# Patient Record
Sex: Female | Born: 1980
Health system: Southern US, Community
[De-identification: ages and names within clinical notes are randomized; demographics above are authoritative.]

## PROBLEM LIST (undated history)

## (undated) ENCOUNTER — Inpatient Hospital Stay (HOSPITAL_COMMUNITY): Payer: Self-pay

## (undated) DIAGNOSIS — A549 Gonococcal infection, unspecified: Secondary | ICD-10-CM

## (undated) DIAGNOSIS — F329 Major depressive disorder, single episode, unspecified: Secondary | ICD-10-CM

## (undated) DIAGNOSIS — F319 Bipolar disorder, unspecified: Secondary | ICD-10-CM

## (undated) DIAGNOSIS — F419 Anxiety disorder, unspecified: Secondary | ICD-10-CM

## (undated) DIAGNOSIS — K7460 Unspecified cirrhosis of liver: Secondary | ICD-10-CM

## (undated) DIAGNOSIS — N289 Disorder of kidney and ureter, unspecified: Secondary | ICD-10-CM

## (undated) DIAGNOSIS — I129 Hypertensive chronic kidney disease with stage 1 through stage 4 chronic kidney disease, or unspecified chronic kidney disease: Secondary | ICD-10-CM

## (undated) DIAGNOSIS — N739 Female pelvic inflammatory disease, unspecified: Secondary | ICD-10-CM

## (undated) DIAGNOSIS — R0600 Dyspnea, unspecified: Secondary | ICD-10-CM

## (undated) DIAGNOSIS — IMO0002 Reserved for concepts with insufficient information to code with codable children: Secondary | ICD-10-CM

## (undated) DIAGNOSIS — F32A Depression, unspecified: Secondary | ICD-10-CM

## (undated) DIAGNOSIS — K219 Gastro-esophageal reflux disease without esophagitis: Secondary | ICD-10-CM

## (undated) DIAGNOSIS — A599 Trichomoniasis, unspecified: Secondary | ICD-10-CM

## (undated) DIAGNOSIS — M329 Systemic lupus erythematosus, unspecified: Secondary | ICD-10-CM

## (undated) HISTORY — DX: Unspecified cirrhosis of liver: K74.60

## (undated) HISTORY — PX: RENAL BIOPSY: SHX156

---
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
## 2002-07-28 ENCOUNTER — Emergency Department (HOSPITAL_COMMUNITY): Admission: EM | Admit: 2002-07-28 | Discharge: 2002-07-28 | Payer: Self-pay | Admitting: Internal Medicine

## 2002-12-22 ENCOUNTER — Ambulatory Visit (HOSPITAL_COMMUNITY): Admission: RE | Admit: 2002-12-22 | Discharge: 2002-12-22 | Payer: Self-pay | Admitting: Obstetrics and Gynecology

## 2002-12-30 ENCOUNTER — Ambulatory Visit (HOSPITAL_COMMUNITY): Admission: AD | Admit: 2002-12-30 | Discharge: 2002-12-30 | Payer: Self-pay | Admitting: Internal Medicine

## 2003-01-02 ENCOUNTER — Inpatient Hospital Stay (HOSPITAL_COMMUNITY): Admission: RE | Admit: 2003-01-02 | Discharge: 2003-01-04 | Payer: Self-pay | Admitting: Obstetrics and Gynecology

## 2004-06-07 ENCOUNTER — Emergency Department (HOSPITAL_COMMUNITY): Admission: EM | Admit: 2004-06-07 | Discharge: 2004-06-07 | Payer: Self-pay | Admitting: Emergency Medicine

## 2005-02-21 ENCOUNTER — Inpatient Hospital Stay (HOSPITAL_COMMUNITY): Admission: EM | Admit: 2005-02-21 | Discharge: 2005-02-23 | Payer: Self-pay | Admitting: Emergency Medicine

## 2005-08-05 ENCOUNTER — Emergency Department (HOSPITAL_COMMUNITY): Admission: EM | Admit: 2005-08-05 | Discharge: 2005-08-05 | Payer: Self-pay | Admitting: Emergency Medicine

## 2005-08-27 ENCOUNTER — Emergency Department (HOSPITAL_COMMUNITY): Admission: EM | Admit: 2005-08-27 | Discharge: 2005-08-27 | Payer: Self-pay | Admitting: Emergency Medicine

## 2005-11-05 ENCOUNTER — Emergency Department (HOSPITAL_COMMUNITY): Admission: EM | Admit: 2005-11-05 | Discharge: 2005-11-05 | Payer: Self-pay | Admitting: Emergency Medicine

## 2005-11-11 ENCOUNTER — Emergency Department (HOSPITAL_COMMUNITY): Admission: EM | Admit: 2005-11-11 | Discharge: 2005-11-11 | Payer: Self-pay | Admitting: Emergency Medicine

## 2005-11-13 ENCOUNTER — Emergency Department (HOSPITAL_COMMUNITY): Admission: EM | Admit: 2005-11-13 | Discharge: 2005-11-13 | Payer: Self-pay | Admitting: Emergency Medicine

## 2006-02-10 ENCOUNTER — Emergency Department (HOSPITAL_COMMUNITY): Admission: EM | Admit: 2006-02-10 | Discharge: 2006-02-10 | Payer: Self-pay | Admitting: Emergency Medicine

## 2006-08-01 ENCOUNTER — Emergency Department (HOSPITAL_COMMUNITY): Admission: EM | Admit: 2006-08-01 | Discharge: 2006-08-01 | Payer: Self-pay | Admitting: Emergency Medicine

## 2006-09-20 ENCOUNTER — Emergency Department (HOSPITAL_COMMUNITY): Admission: EM | Admit: 2006-09-20 | Discharge: 2006-09-20 | Payer: Self-pay | Admitting: Emergency Medicine

## 2006-11-25 ENCOUNTER — Emergency Department (HOSPITAL_COMMUNITY): Admission: EM | Admit: 2006-11-25 | Discharge: 2006-11-25 | Payer: Self-pay | Admitting: Emergency Medicine

## 2007-01-02 ENCOUNTER — Emergency Department (HOSPITAL_COMMUNITY): Admission: EM | Admit: 2007-01-02 | Discharge: 2007-01-02 | Payer: Self-pay | Admitting: Emergency Medicine

## 2007-01-27 ENCOUNTER — Emergency Department (HOSPITAL_COMMUNITY): Admission: EM | Admit: 2007-01-27 | Discharge: 2007-01-27 | Payer: Self-pay | Admitting: Emergency Medicine

## 2007-05-13 ENCOUNTER — Ambulatory Visit (HOSPITAL_COMMUNITY): Admission: RE | Admit: 2007-05-13 | Discharge: 2007-05-13 | Payer: Self-pay | Admitting: Family Medicine

## 2007-05-18 ENCOUNTER — Ambulatory Visit (HOSPITAL_COMMUNITY): Admission: RE | Admit: 2007-05-18 | Discharge: 2007-05-18 | Payer: Self-pay | Admitting: Neonatology

## 2007-05-18 ENCOUNTER — Ambulatory Visit: Payer: Self-pay | Admitting: *Deleted

## 2007-05-18 ENCOUNTER — Inpatient Hospital Stay (HOSPITAL_COMMUNITY): Admission: AD | Admit: 2007-05-18 | Discharge: 2007-05-18 | Payer: Self-pay | Admitting: Obstetrics & Gynecology

## 2007-07-08 ENCOUNTER — Emergency Department (HOSPITAL_COMMUNITY): Admission: EM | Admit: 2007-07-08 | Discharge: 2007-07-08 | Payer: Self-pay | Admitting: *Deleted

## 2007-08-04 ENCOUNTER — Inpatient Hospital Stay (HOSPITAL_COMMUNITY): Admission: AD | Admit: 2007-08-04 | Discharge: 2007-08-06 | Payer: Self-pay | Admitting: Obstetrics

## 2008-01-15 ENCOUNTER — Emergency Department (HOSPITAL_COMMUNITY): Admission: EM | Admit: 2008-01-15 | Discharge: 2008-01-15 | Payer: Self-pay | Admitting: Emergency Medicine

## 2008-01-28 ENCOUNTER — Inpatient Hospital Stay (HOSPITAL_COMMUNITY): Admission: AD | Admit: 2008-01-28 | Discharge: 2008-01-29 | Payer: Self-pay | Admitting: Obstetrics & Gynecology

## 2008-02-29 ENCOUNTER — Emergency Department (HOSPITAL_COMMUNITY): Admission: EM | Admit: 2008-02-29 | Discharge: 2008-02-29 | Payer: Self-pay | Admitting: Emergency Medicine

## 2008-07-18 ENCOUNTER — Ambulatory Visit (HOSPITAL_COMMUNITY): Admission: RE | Admit: 2008-07-18 | Discharge: 2008-07-18 | Payer: Self-pay | Admitting: Obstetrics

## 2009-03-09 ENCOUNTER — Inpatient Hospital Stay (HOSPITAL_COMMUNITY): Admission: AD | Admit: 2009-03-09 | Discharge: 2009-03-10 | Payer: Self-pay | Admitting: Obstetrics

## 2009-05-04 ENCOUNTER — Emergency Department (HOSPITAL_COMMUNITY): Admission: EM | Admit: 2009-05-04 | Discharge: 2009-05-05 | Payer: Self-pay | Admitting: Emergency Medicine

## 2010-09-02 ENCOUNTER — Emergency Department (HOSPITAL_COMMUNITY): Admission: EM | Admit: 2010-09-02 | Discharge: 2010-09-02 | Payer: Self-pay | Admitting: Emergency Medicine

## 2010-11-08 ENCOUNTER — Emergency Department (HOSPITAL_COMMUNITY)
Admission: EM | Admit: 2010-11-08 | Discharge: 2010-11-08 | Payer: Self-pay | Source: Home / Self Care | Admitting: Emergency Medicine

## 2010-12-01 NOTE — L&D Delivery Note (Signed)
Delivery Note At 12:47 PM a viable and healthy female was delivered via Vaginal, Spontaneous Delivery (Presentation:ROA).  APGAR: 9, 9; weight 6 lb 12.6 oz (3079 g).   Placenta status: delivered, intact .  Cord: 3 vessels with the following complications: none.  Anesthesia: Epidural  Episiotomy: None Lacerations: none Est. Blood Loss (mL): 350 mL  Mom to postpartum.  Baby to room in with mother.  Uno Esau N 09/24/2011, 1:08 PM

## 2011-02-10 LAB — URINALYSIS, ROUTINE W REFLEX MICROSCOPIC
Nitrite: NEGATIVE
Protein, ur: NEGATIVE mg/dL
Specific Gravity, Urine: 1.018 (ref 1.005–1.030)
Urobilinogen, UA: 0.2 mg/dL (ref 0.0–1.0)

## 2011-02-10 LAB — URINE CULTURE
Colony Count: NO GROWTH
Culture  Setup Time: 201112091522
Culture: NO GROWTH

## 2011-02-10 LAB — POCT PREGNANCY, URINE: Preg Test, Ur: NEGATIVE

## 2011-02-10 LAB — WET PREP, GENITAL

## 2011-02-10 LAB — URINE MICROSCOPIC-ADD ON

## 2011-02-13 ENCOUNTER — Inpatient Hospital Stay (HOSPITAL_COMMUNITY)
Admission: AD | Admit: 2011-02-13 | Discharge: 2011-02-13 | Disposition: A | Discharge status: Home / Self Care | Payer: Medicaid Other | Source: Ambulatory Visit | Attending: Obstetrics | Admitting: Obstetrics

## 2011-02-13 DIAGNOSIS — Z3201 Encounter for pregnancy test, result positive: Secondary | ICD-10-CM | POA: Insufficient documentation

## 2011-02-13 LAB — URINALYSIS, ROUTINE W REFLEX MICROSCOPIC
Ketones, ur: NEGATIVE mg/dL
Nitrite: NEGATIVE
Protein, ur: NEGATIVE mg/dL

## 2011-02-13 LAB — POCT PREGNANCY, URINE: Preg Test, Ur: POSITIVE

## 2011-03-01 ENCOUNTER — Inpatient Hospital Stay (HOSPITAL_COMMUNITY)
Admission: AD | Admit: 2011-03-01 | Discharge: 2011-03-01 | Disposition: A | Discharge status: Home / Self Care | Payer: Medicaid Other | Source: Ambulatory Visit | Attending: Obstetrics | Admitting: Obstetrics

## 2011-03-01 DIAGNOSIS — K219 Gastro-esophageal reflux disease without esophagitis: Secondary | ICD-10-CM | POA: Insufficient documentation

## 2011-03-01 DIAGNOSIS — O99891 Other specified diseases and conditions complicating pregnancy: Secondary | ICD-10-CM | POA: Insufficient documentation

## 2011-03-01 DIAGNOSIS — R109 Unspecified abdominal pain: Secondary | ICD-10-CM | POA: Insufficient documentation

## 2011-03-01 LAB — URINALYSIS, ROUTINE W REFLEX MICROSCOPIC
Bilirubin Urine: NEGATIVE
Ketones, ur: NEGATIVE mg/dL
Nitrite: NEGATIVE
Protein, ur: NEGATIVE mg/dL
Urobilinogen, UA: 2 mg/dL — ABNORMAL HIGH (ref 0.0–1.0)

## 2011-03-05 LAB — GC/CHLAMYDIA PROBE AMP, GENITAL: Chlamydia, DNA Probe: NEGATIVE

## 2011-03-12 LAB — CBC
HCT: 28.7 % — ABNORMAL LOW (ref 36.0–46.0)
HCT: 30.6 % — ABNORMAL LOW (ref 36.0–46.0)
Hemoglobin: 10.1 g/dL — ABNORMAL LOW (ref 12.0–15.0)
Hemoglobin: 9.7 g/dL — ABNORMAL LOW (ref 12.0–15.0)
MCHC: 33.7 g/dL (ref 30.0–36.0)
MCV: 86.6 fL (ref 78.0–100.0)
Platelets: 203 10*3/uL (ref 150–400)
RBC: 3.32 MIL/uL — ABNORMAL LOW (ref 3.87–5.11)
RBC: 3.51 MIL/uL — ABNORMAL LOW (ref 3.87–5.11)
WBC: 7.7 10*3/uL (ref 4.0–10.5)

## 2011-04-18 NOTE — Discharge Summary (Signed)
NAME:  Cathy [name], Cathy [name] NO.:  [id number]   MEDICAL RECORD NO.:  [id number]          PATIENT TYPE:  INP   LOCATION:  A320                          FACILITY:  [facility]   PHYSICIAN:  Florian Buff, M.D.   DATE OF BIRTH:  17-Mar-1981   DATE OF ADMISSION:  02/21/2005  DATE OF DISCHARGE:  03/26/2006LH                                 DISCHARGE SUMMARY   DISCHARGE DIAGNOSES:  1.  Pelvic inflammatory disease.  2.  Status post appropriate IV antibiotics.  3.  Positive GC and chlamydia.   HOSPITAL COURSE:  The patient was admitted by me through the emergency room.  She had pelvic pain that was consistent with PID but it was localized to the  right.  CT scan did not show an appendicitis.  As a result, I admitted her  and put her on Rocephin and Zithromax, to which she responded well.  Her  abdominal pain resolved.  Her white blood cell count came down.  Her  cultures did come back positive for GC and chlamydia.  She was informed of  this and she will inform her sexual partner.  She was discharged to home on  Levaquin, which she will take for the next 10 days, and will follow up in  the office next week.      LHE/MEDQ  D:  03/26/2005  T:  03/26/2005  Job:  [id number]

## 2011-04-18 NOTE — H&P (Signed)
   NAME:  Cathy [name], Cathy [name]                        ACCOUNT NO.:  [id number]   MEDICAL RECORD NO.:  [id number]                   PATIENT TYPE:  INP   LOCATION:  LDR2                                 FACILITY:  [facility]   PHYSICIAN:  Jonnie Kind, M.D.              DATE OF BIRTH:  January 06, 1981   DATE OF ADMISSION:  01/02/2003  DATE OF DISCHARGE:                                HISTORY & PHYSICAL   ADMISSION DIAGNOSES:  Pregnancy at 39-1/[redacted] weeks gestation, early labor.   HISTORY OF PRESENT ILLNESS:  This 30 year old female gravida 1, para 0, AB 0  with last menstrual period Apr 01, 2002 placing menstrual [facility] January 09, 2003 with ultrasounds corresponding at January 06, 2003 and January 05, 2003  based on first and trimester scans was admitted after a pregnancy course  followed through our office since June of last year.  She has had  appropriate weight gain and fundal height growth.  The patient presented at  approximately 7:30 a.m. on January 02, 2003 complaining of regular uterine  contractions every three to four minutes with cervix 2 cm dilated, vertex  presentation, membranes intact.  The patient is admitted for labor  management.  Amniotomy by Dr. Elonda Husky reveals clear amniotic fluid.  Scalp  electrode is in place.  The patient desires intravenous analgesics and plans  to breast feed.  If the infant is a female she requests circumcision of the  infant.   LABORATORY DATA:  Prenatal labs included blood type O positive, urine drug  screen positive for marijuana.  Rubella immunity present. Hemoglobin 13,  hematocrit 31, hepatitis, HIV, GC, Chlamydia, RPR are all negative.  Pap  smear within normal limits.  _______ normal with glucose tolerance test 76  mg percent.   SOCIAL HISTORY:  The patient is supported by family members and partner.   PHYSICAL EXAMINATION:  VITAL SIGNS:  Height 5 feet 4 inches, weight 160.  GENERAL:  Examination shows a healthy African-American female tolerating  labor  adequately with a term size fetus, vertex presentation.  PELVIC:  Cervix 2 cm, 50% effaced, minus 1 station, vertex with good fetal  heart reactivity.   PLAN:  Intravenous analgesics unless patient changes her mind.  Good  prognosis for vaginal delivery.                                               Jonnie Kind, M.D.    JVF/MEDQ  D:  01/02/2003  T:  01/02/2003  Job:  [id number]

## 2011-04-18 NOTE — H&P (Signed)
NAME:  Cathy [name], Cathy [name] NO.:  [id number]   MEDICAL RECORD NO.:  [id number]          PATIENT TYPE:  INP   LOCATION:  A320                          FACILITY:  [facility]   PHYSICIAN:  Florian Buff, M.D.   DATE OF BIRTH:  [birth year]   DATE OF ADMISSION:  02/21/2005  DATE OF DISCHARGE:  LH                                HISTORY & PHYSICAL   HISTORY OF PRESENT ILLNESS:  Hannia is a 30 year old white female, gravida  1, para 0, whose last menstrual period was about three weeks ago, who was  seen in the emergency room with acute onset of right lower quadrant pain.  The patient had evaluation with a CT scan of the pelvis looking for  appendicitis, and that was negative.  Her UA did have some white cells in  it, but the thought was that this was probably PID.  I have admitted her for  IV Rocephin and Zithromax.  The patient had GC and Chlamydia performed in  the emergency room.   PAST MEDICAL HISTORY:  Negative.   PAST SURGICAL HISTORY:  Negative.   PAST OBSTETRICAL HISTORY:  One vaginal delivery.   ALLERGIES:  No known drug allergies.   MEDICATIONS:  None.   REVIEW OF SYSTEMS:  Otherwise negative.   PHYSICAL EXAMINATION:  HEENT:  Unremarkable.  NECK:  Thyroid is normal.  LUNGS:  Clear.  HEART:  Regular rate and rhythm, no murmurs, rubs, or gallops.  BREASTS:  Deferred.  ABDOMEN:  Tender in both lower quadrants, right greater than left.  Cervical  motion tenderness positive in the ER, otherwise no mass.  EXTREMITIES:  Warm, no edema.  NEUROLOGIC:  Grossly intact.   IMPRESSION:  Pelvic inflammatory disease versus appendicitis, much less  likely.   PLAN:  The patient is admitted for intravenous Rocephin and Zithromax.  I am  going to wait culture results.      LHE/MEDQ  D:  02/22/2005  T:  02/22/2005  Job:  [id number]

## 2011-04-18 NOTE — H&P (Signed)
   NAME:  Cathy [name], Cathy [name]                        ACCOUNT NO.:  [id number]   MEDICAL RECORD NO.:  [id number]                   PATIENT TYPE:  INP   LOCATION:  LDR2                                 FACILITY:  [facility]   PHYSICIAN:  Florian Buff, M.D.                DATE OF BIRTH:  05/21/1981   DATE OF ADMISSION:  01/02/2003  DATE OF DISCHARGE:                                HISTORY & PHYSICAL   HISTORY OF PRESENT ILLNESS:  The patient is a 30 year old African-American  female, gravida 1, para 0, estimated date of delivery of 01/06/03, currently  at 39-1/2 weeks' gestation, admitted in active phase of labor.  Her  antepartum course has been unremarkable.  Her cervix at admission was 2, 90,  and 0 to -1, and she is having regular contractions and a reassuring fetal  heart rate strip.   PAST MEDICAL HISTORY:  Asthma requiring occasional inhaler.   PAST SURGICAL HISTORY:  Negative.   PAST OBSTETRICAL HISTORY:  She is nulliparous.   ALLERGIES:  None.   MEDICATIONS:  Her only medications are prenatal vitamins and occasional  inhaler.   SOCIAL HISTORY:  She is single, lives with her parents.  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   REVIEW OF SYSTEMS:  Otherwise negative.   PRENATAL LABORATORY DATA:  Blood type is O positive.  Antibody screen is  negative.  Serology is nonreactive.  Hepatitis B is negative.  HIV is  nonreactive.  GC and Chlamydia is negative.  Her Pap was normal.  Her AFP  was normal.  She is group B strep negative.  Her Glucola is 76, and her  sickle screen was negative.   PHYSICAL EXAMINATION:  Her last fundal height in the office was 37 cm.  Cervix is 2-3, 100, and 0, and artificial rupture of membranes was performed  of clear fluid.   IMPRESSION:  1. Intrauterine pregnancy at 39-1/2 weeks' gestation.  2. Early active labor.   PLAN:  Expectant management for NSVD.                                                Florian Buff, M.D.   Estevan Oaks  D:  01/02/2003  T:   01/02/2003  Job:  [id number]

## 2011-04-18 NOTE — Op Note (Signed)
   NAME:  LAQUISHA, MASTRANDREA                        ACCOUNT NO.:  [id number]   MEDICAL RECORD NO.:  [id number]                   PATIENT TYPE:  INP   LOCATION:  A417                                 FACILITY:  [facility]   PHYSICIAN:  Jonnie Kind, M.D.              DATE OF BIRTH:  May 21, 1981   DATE OF PROCEDURE:  DATE OF DISCHARGE:                                 OPERATIVE REPORT   DELIVERY NOTE:  Jenasis became fully dilated with an urge to push at  approximately 1400. After an hour and a half of pushing, the fetal heart  rate started having deep variables down to the 70s with contractions. The  vertex was done to a plus 4 station. A red Robinson catheter was utilized to  empty approximately 400 cc of urine from the bladder. A vacuum extractor was  then offered and accepted. After two pop-offs  due to fetal molding, the  vacuum extractor was discontinued. However, the baby was brought down far  enough so that Ms. Maloof delivered the baby over the next contraction at  1550 p.m.  The baby delivered easily. Mouth and nose were suctioned with a  bulb syringe and positive bonding was noted. Apgar are 8 & 9, weight 7  pounds 1 ounce. The vagina was inspected and no lacerations were found.  Twenty units of Pitocin diluted in 1000 cc of lactated Ringer's was then  infused rapidly IV. The placenta separated spontaneously and was delivered  via maternal pushing effort at 1600. It was inspected and appears to be  intact with a three vessel cord. The fundus was immediately firm and minimal  blood loss was noted. Estimated blood loss 250 cc.     Christin Fudge, C.N.M.          Jonnie Kind, M.D.    FC/MEDQ  D:  01/02/2003  T:  01/02/2003  Job:  [id number]

## 2011-05-09 ENCOUNTER — Emergency Department (HOSPITAL_COMMUNITY)
Admission: EM | Admit: 2011-05-09 | Discharge: 2011-05-09 | Disposition: A | Discharge status: Home / Self Care | Payer: Medicaid Other | Attending: Emergency Medicine | Admitting: Emergency Medicine

## 2011-05-09 DIAGNOSIS — R064 Hyperventilation: Secondary | ICD-10-CM | POA: Insufficient documentation

## 2011-05-09 DIAGNOSIS — R0602 Shortness of breath: Secondary | ICD-10-CM | POA: Insufficient documentation

## 2011-05-09 DIAGNOSIS — O99891 Other specified diseases and conditions complicating pregnancy: Secondary | ICD-10-CM | POA: Insufficient documentation

## 2011-05-09 DIAGNOSIS — J45909 Unspecified asthma, uncomplicated: Secondary | ICD-10-CM | POA: Insufficient documentation

## 2011-05-09 DIAGNOSIS — R0989 Other specified symptoms and signs involving the circulatory and respiratory systems: Secondary | ICD-10-CM | POA: Insufficient documentation

## 2011-05-09 DIAGNOSIS — F411 Generalized anxiety disorder: Secondary | ICD-10-CM | POA: Insufficient documentation

## 2011-05-09 DIAGNOSIS — R0609 Other forms of dyspnea: Secondary | ICD-10-CM | POA: Insufficient documentation

## 2011-07-02 ENCOUNTER — Other Ambulatory Visit (HOSPITAL_COMMUNITY): Payer: Self-pay | Admitting: Obstetrics

## 2011-07-02 DIAGNOSIS — Z3689 Encounter for other specified antenatal screening: Secondary | ICD-10-CM

## 2011-07-08 ENCOUNTER — Ambulatory Visit (HOSPITAL_COMMUNITY): Payer: Medicaid Other | Attending: Obstetrics

## 2011-08-03 ENCOUNTER — Emergency Department (HOSPITAL_COMMUNITY)
Admission: EM | Admit: 2011-08-03 | Discharge: 2011-08-03 | Disposition: A | Discharge status: Home / Self Care | Payer: Medicaid Other | Attending: Emergency Medicine | Admitting: Emergency Medicine

## 2011-08-03 DIAGNOSIS — R0609 Other forms of dyspnea: Secondary | ICD-10-CM | POA: Insufficient documentation

## 2011-08-03 DIAGNOSIS — R0789 Other chest pain: Secondary | ICD-10-CM | POA: Insufficient documentation

## 2011-08-03 DIAGNOSIS — L299 Pruritus, unspecified: Secondary | ICD-10-CM | POA: Insufficient documentation

## 2011-08-03 DIAGNOSIS — R070 Pain in throat: Secondary | ICD-10-CM | POA: Insufficient documentation

## 2011-08-03 DIAGNOSIS — R0602 Shortness of breath: Secondary | ICD-10-CM | POA: Insufficient documentation

## 2011-08-03 DIAGNOSIS — L509 Urticaria, unspecified: Secondary | ICD-10-CM | POA: Insufficient documentation

## 2011-08-03 DIAGNOSIS — R0989 Other specified symptoms and signs involving the circulatory and respiratory systems: Secondary | ICD-10-CM | POA: Insufficient documentation

## 2011-08-03 DIAGNOSIS — L259 Unspecified contact dermatitis, unspecified cause: Secondary | ICD-10-CM | POA: Insufficient documentation

## 2011-08-03 DIAGNOSIS — O99891 Other specified diseases and conditions complicating pregnancy: Secondary | ICD-10-CM | POA: Insufficient documentation

## 2011-08-22 LAB — URINALYSIS, ROUTINE W REFLEX MICROSCOPIC
Glucose, UA: NEGATIVE
Hgb urine dipstick: NEGATIVE
Protein, ur: 100 — AB

## 2011-08-22 LAB — URINE MICROSCOPIC-ADD ON

## 2011-08-22 LAB — PREGNANCY, URINE: Preg Test, Ur: POSITIVE

## 2011-08-22 LAB — CBC
HCT: 34.3 — ABNORMAL LOW
MCHC: 34.5
MCV: 83.9
Platelets: 227
Platelets: 282
WBC: 5.6
WBC: 6.1

## 2011-08-22 LAB — HCG, QUANTITATIVE, PREGNANCY: hCG, Beta Chain, Quant, S: 2539 — ABNORMAL HIGH

## 2011-08-22 LAB — WET PREP, GENITAL
Trich, Wet Prep: NONE SEEN
Yeast Wet Prep HPF POC: NONE SEEN

## 2011-08-22 LAB — ABO/RH: ABO/RH(D): O POS

## 2011-08-22 LAB — GC/CHLAMYDIA PROBE AMP, GENITAL: GC Probe Amp, Genital: NEGATIVE

## 2011-09-12 ENCOUNTER — Inpatient Hospital Stay (HOSPITAL_COMMUNITY)
Admission: AD | Admit: 2011-09-12 | Discharge: 2011-09-12 | Disposition: A | Discharge status: Home / Self Care | Payer: Medicaid Other | Source: Ambulatory Visit | Attending: Obstetrics and Gynecology | Admitting: Obstetrics and Gynecology

## 2011-09-12 ENCOUNTER — Encounter (HOSPITAL_COMMUNITY): Payer: Self-pay

## 2011-09-12 DIAGNOSIS — J069 Acute upper respiratory infection, unspecified: Secondary | ICD-10-CM | POA: Insufficient documentation

## 2011-09-12 DIAGNOSIS — O99891 Other specified diseases and conditions complicating pregnancy: Secondary | ICD-10-CM | POA: Insufficient documentation

## 2011-09-12 DIAGNOSIS — N898 Other specified noninflammatory disorders of vagina: Secondary | ICD-10-CM | POA: Insufficient documentation

## 2011-09-12 HISTORY — DX: Major depressive disorder, single episode, unspecified: F32.9

## 2011-09-12 HISTORY — DX: Depression, unspecified: F32.A

## 2011-09-12 LAB — CBC
HCT: 31.8 — ABNORMAL LOW
Hemoglobin: 10.5 — ABNORMAL LOW
Hemoglobin: 11 — ABNORMAL LOW
MCHC: 34.4
MCV: 84.8
MCV: 85.6
RBC: 3.57 — ABNORMAL LOW
RDW: 14.1 — ABNORMAL HIGH
WBC: 6.4

## 2011-09-12 MED ORDER — AZITHROMYCIN 250 MG PO TABS: ORAL_TABLET | ORAL | Status: AC

## 2011-09-12 NOTE — Progress Notes (Signed)
Pt states she feel her stomach tightening up and sharp pains

## 2011-09-12 NOTE — ED Provider Notes (Signed)
History    Chief Complaint  Patient presents with  . URI   HPI  30 yo G4P with 10 day history of worsening URI symptoms: sinus congestion, cough, nasal drainage.  Cough mildly productive of sputum. She also describes some increased vaginal discharge just today.  Some mild nausea, no vomiting.  Has felt too congested to eat anything today, although she states she feels hungry now.  Feeling baby move well, no contractions, though does have some back pain.  No vaginal bleeding.  No fevers or chills   Past Medical History  Diagnosis Date  . Depression     Past Surgical History  Procedure Date  . No past surgeries     Family History  Problem Relation Age of Onset  . Diabetes Maternal Grandmother   . Hypertension Maternal Grandmother   . Diabetes Maternal Grandfather   . Hypertension Maternal Grandfather   . Diabetes Paternal Grandmother   . Hypertension Paternal Grandmother   . Diabetes Paternal Grandfather   . Hypertension Paternal Grandfather     History  Substance Use Topics  . Smoking status: Current Some Day Smoker -- 0.2 packs/day  . Smokeless tobacco: Never Used  . Alcohol Use: No    Allergies: No Known Allergies  Prescriptions prior to admission  Medication Sig Dispense Refill  . diphenhydramine-acetaminophen (TYLENOL PM) 25-500 MG TABS Take 1-2 tablets by mouth at bedtime as needed. For pain/sleep       . Hydrocortisone-Aloe Vera (HYDROCORTISONE-ALOE EX) Apply 1 application topically 2 (two) times daily as needed. For eczema       . prenatal vitamin w/FE, FA (PRENATAL 1 + 1) 27-1 MG TABS Take 1 tablet by mouth daily.          ROS See HPI above for review of systems.    Physical Exam   Blood pressure 111/61, pulse 75, temperature 99.2 F (37.3 C), temperature source Oral, resp. rate 20, height 5\' 8"  (1.727 m), weight 155 lb 9.6 oz (70.58 kg).  Physical Exam Gen:  Alert, cooperative patient who appears stated age in no acute distress.  Vital signs  reviewed. HEENT:  /AT.  EOMI, PERRL.  Nasal turbinates boggy with erythema and exudates.  MMM, tonsils non-erythematous, non-edematous.  External ears WNL, Bilateral TM's normal without retraction, redness or bulging.  Neck: No masses or thyromegaly or limitation in range of motion.  No cervical lymphadenopathy. Cardiac:  Regular rate and rhythm without murmur auscultated.  Good S1/S2. Pulm:  Clear to auscultation bilaterally with good air movement.  No wheezes or rales noted.   Abd:  Gravid uterus, nontender GYN:  External genitalia within normal limits.  Vaginal mucosa pink, moist, normal rugae.  Nonfriable cervix without lesions, mild discharge noted, no bleeding noted.  Ferning slide obtained.  Bimanual exam revealed cervix to be closed/thick/high/posterior    MAU Course  Procedures Ferning slide:  Negative for pooling Wet prep revealed:  Results for orders placed during the hospital encounter of 09/12/11 (from the past 24 hour(s))  WET PREP, GENITAL     Status: Abnormal   Collection Time   09/12/11  7:03 PM      Component Value Range   Yeast, Wet Prep NONE SEEN  NONE SEEN    Trich, Wet Prep NONE SEEN  NONE SEEN    Clue Cells, Wet Prep NONE SEEN  NONE SEEN    WBC, Wet Prep HPF POC FEW (*) NONE SEEN        Assessment and Plan  1.  URI:  Present for greater than 10 days. Plan to treat with Azithromycin 2.  Vaginal discharge: wet prep negative  3. FU at regularly scheduled OB appt next week - reviewed with Dr. Loann Quill who agreed with plan  [facility] [address] 09/12/2011, 6:21 PM

## 2011-09-12 NOTE — Progress Notes (Signed)
Pt states she started having cold symptoms on Monday, head and chest congestion, cough and sore throat. Has been having sharp abdominal pain off and on.

## 2011-09-17 LAB — AMYLASE: Amylase: 100

## 2011-09-17 LAB — CBC
HCT: 26 — ABNORMAL LOW
Platelets: 220
RDW: 14

## 2011-09-17 LAB — GC/CHLAMYDIA PROBE AMP, GENITAL: Chlamydia, DNA Probe: NEGATIVE

## 2011-09-17 LAB — COMPREHENSIVE METABOLIC PANEL
Albumin: 2.4 — ABNORMAL LOW
Alkaline Phosphatase: 47
BUN: 5 — ABNORMAL LOW
Potassium: 3.6
Sodium: 134 — ABNORMAL LOW
Total Protein: 6.3

## 2011-09-17 LAB — DIFFERENTIAL
Basophils Relative: 0
Monocytes Absolute: 0.5
Monocytes Relative: 8
Neutro Abs: 5.3

## 2011-09-17 LAB — WET PREP, GENITAL: Trich, Wet Prep: NONE SEEN

## 2011-09-17 LAB — URINALYSIS, ROUTINE W REFLEX MICROSCOPIC
Glucose, UA: NEGATIVE
Specific Gravity, Urine: 1.015
pH: 7

## 2011-09-17 LAB — URINE MICROSCOPIC-ADD ON

## 2011-09-22 ENCOUNTER — Encounter (HOSPITAL_COMMUNITY): Payer: Self-pay | Admitting: *Deleted

## 2011-09-22 ENCOUNTER — Inpatient Hospital Stay (HOSPITAL_COMMUNITY)
Admission: AD | Admit: 2011-09-22 | Discharge: 2011-09-22 | Disposition: A | Discharge status: Home / Self Care | Payer: Medicaid Other | Source: Ambulatory Visit | Attending: Obstetrics & Gynecology | Admitting: Obstetrics & Gynecology

## 2011-09-22 ENCOUNTER — Inpatient Hospital Stay (HOSPITAL_COMMUNITY): Payer: Medicaid Other

## 2011-09-22 DIAGNOSIS — IMO0002 Reserved for concepts with insufficient information to code with codable children: Secondary | ICD-10-CM

## 2011-09-22 DIAGNOSIS — J45909 Unspecified asthma, uncomplicated: Secondary | ICD-10-CM

## 2011-09-22 DIAGNOSIS — O0930 Supervision of pregnancy with insufficient antenatal care, unspecified trimester: Secondary | ICD-10-CM | POA: Insufficient documentation

## 2011-09-22 DIAGNOSIS — O479 False labor, unspecified: Secondary | ICD-10-CM | POA: Insufficient documentation

## 2011-09-22 HISTORY — DX: Bipolar disorder, unspecified: F31.9

## 2011-09-22 LAB — DIFFERENTIAL
Eosinophils Relative: 0 % (ref 0–5)
Lymphocytes Relative: 9 % — ABNORMAL LOW (ref 12–46)
Lymphs Abs: 0.5 10*3/uL — ABNORMAL LOW (ref 0.7–4.0)
Monocytes Absolute: 0.4 10*3/uL (ref 0.1–1.0)
Monocytes Relative: 8 % (ref 3–12)
Neutro Abs: 4.2 10*3/uL (ref 1.7–7.7)

## 2011-09-22 LAB — CBC
HCT: 26.9 % — ABNORMAL LOW (ref 36.0–46.0)
Hemoglobin: 9 g/dL — ABNORMAL LOW (ref 12.0–15.0)
MCV: 87.6 fL (ref 78.0–100.0)
RBC: 3.07 MIL/uL — ABNORMAL LOW (ref 3.87–5.11)
RDW: 14.2 % (ref 11.5–15.5)
WBC: 5.1 10*3/uL (ref 4.0–10.5)

## 2011-09-22 LAB — WET PREP, GENITAL

## 2011-09-22 LAB — RAPID URINE DRUG SCREEN, HOSP PERFORMED
Amphetamines: NOT DETECTED
Benzodiazepines: NOT DETECTED
Cocaine: NOT DETECTED
Opiates: NOT DETECTED

## 2011-09-22 LAB — AMNISURE RUPTURE OF MEMBRANE (ROM) NOT AT ARMC: Amnisure ROM: NEGATIVE

## 2011-09-22 LAB — URINALYSIS, ROUTINE W REFLEX MICROSCOPIC
Bilirubin Urine: NEGATIVE
Glucose, UA: NEGATIVE mg/dL
Hgb urine dipstick: NEGATIVE
Ketones, ur: NEGATIVE mg/dL
Protein, ur: NEGATIVE mg/dL

## 2011-09-22 NOTE — Progress Notes (Signed)
Ctx's started 2030 on Sunday, started leaking clear fluid this morning at 0830. Ctx's getting closer and stronger.

## 2011-09-22 NOTE — ED Provider Notes (Signed)
Cathy [name] is a 30 y.o. female at [redacted]w[redacted]d by 38 week [place]. presenting for term labor eval and ?SROM this morning. She had one prenatal visit at Dr. Marcheta Grammes office, but did not keep subsequent appointments. She states she was excused from the practice. She reports pos FM and denies VB.  Maternal Medical History:  Reason for admission: Reason for admission: rupture of membranes and contractions.  Reason for Admission:   nauseaContractions: Onset was 3-5 hours ago.   Frequency: regular.   Duration is approximately 60 seconds.   Perceived severity is moderate.    Fetal activity: Perceived fetal activity is normal.   Last perceived fetal movement was within the past hour.    Prenatal Complications - Diabetes: Not tested  OB History    Grav Para Term Preterm Abortions TAB SAB Ect Mult Living   5 3 3  0 1 0 1 0 0 3     Patient Active Problem List  Diagnoses  . Prenatal care insufficient  . Large for dates pregnancy  . Asthma    Past Medical History  Diagnosis Date  . Depression   . Bipolar depression   . Asthma    Past Surgical History  Procedure Date  . No past surgeries    Family History: family history includes Diabetes in her maternal grandfather, maternal grandmother, paternal grandfather, and paternal grandmother and Hypertension in her maternal grandfather, maternal grandmother, paternal grandfather, and paternal grandmother. Social History:  reports that she has been smoking.  She has never used smokeless tobacco. She reports that she does not drink alcohol or use illicit drugs.  Review of Systems  Constitutional: Negative for fever and chills.  Eyes: Negative for blurred vision.  Gastrointestinal: Negative for nausea and vomiting.  Neurological: Negative for headaches.    Dilation: 3 Effacement (%): Thick Station: -3 Vertex Membranes felt  Exam by:: V.Elyssa Pendelton,CNM Blood pressure 129/76, pulse 92, temperature 98.9 F (37.2 C), temperature source Oral, resp.  rate 20, height 5\' 7"  (1.702 m), weight 72.576 kg (160 lb). Maternal Exam:  Uterine Assessment: Contraction strength is moderate.  Contraction duration is 80 seconds. Contraction frequency is irregular.   Abdomen: Fundal height is 45 cm.   Fetal presentation: vertex  Introitus: Normal vulva. Vagina is positive for vaginal discharge (mod amount of thin white odorless discharge and mucus).  Ferning test: negative.   Pelvis: adequate for delivery.   Cervix: Cervix evaluated by sterile speculum exam and digital exam.     Fetal Exam Fetal Monitor Review: Mode: ultrasound.   Baseline rate: 130.  Variability: moderate (6-25 bpm).   Pattern: accelerations present and variable decelerations.   Mild variables  Fetal State Assessment: Category II - tracings are indeterminate.     Physical Exam  Constitutional: She is oriented to person, place, and time. She appears well-developed and well-nourished. She appears distressed (mildly).  HENT:  Head: Normocephalic.  Eyes: Pupils are equal, round, and reactive to light.  Cardiovascular: Normal rate.   Respiratory: Effort normal.  GI: Soft. There is no tenderness.  Genitourinary: There is no rash or lesion on the right labia. There is no rash or lesion on the left labia. Uterus is not tender. Cervix exhibits discharge (clear mucus). Cervix exhibits no motion tenderness and no friability. No erythema or bleeding around the vagina. Vaginal discharge (mod amount of thin white odorless discharge and mucus) found.  Musculoskeletal: Normal range of motion.  Neurological: She is alert and oriented to person, place, and time. She has normal  reflexes.  Skin: Skin is warm and dry.  Psychiatric: She has a normal mood and affect.    Prenatal labs: ABO, Rh:  O pos Antibody:   Rubella:   RPR:    HBsAg:    HIV:    GBS:   pending  Results for orders placed during the hospital encounter of 09/22/11 (from the past 24 hour(s))  URINE RAPID DRUG SCREEN  (Experiment)     Status: Abnormal   Collection Time   09/22/11 11:50 AM      Component Value Range   Opiates NONE DETECTED  NONE DETECTED    Cocaine NONE DETECTED  NONE DETECTED    Benzodiazepines NONE DETECTED  NONE DETECTED    Amphetamines NONE DETECTED  NONE DETECTED    Tetrahydrocannabinol POSITIVE (*) NONE DETECTED    Barbiturates NONE DETECTED  NONE DETECTED   URINALYSIS, ROUTINE W REFLEX MICROSCOPIC     Status: Normal   Collection Time   09/22/11 11:50 AM      Component Value Range   Color, Urine YELLOW  YELLOW    Appearance CLEAR  CLEAR    Specific Gravity, Urine 1.015  1.005 - 1.030    pH 6.5  5.0 - 8.0    Glucose, UA NEGATIVE  NEGATIVE (mg/dL)   Hgb urine dipstick NEGATIVE  NEGATIVE    Bilirubin Urine NEGATIVE  NEGATIVE    Ketones, ur NEGATIVE  NEGATIVE (mg/dL)   Protein, ur NEGATIVE  NEGATIVE (mg/dL)   Urobilinogen, UA 1.0  0.0 - 1.0 (mg/dL)   Nitrite NEGATIVE  NEGATIVE    Leukocytes, UA NEGATIVE  NEGATIVE   WET PREP, GENITAL     Status: Abnormal   Collection Time   09/22/11 12:30 PM      Component Value Range   Yeast, Wet Prep NONE SEEN  NONE SEEN    Trich, Wet Prep NONE SEEN  NONE SEEN    Clue Cells, Wet Prep NONE SEEN  NONE SEEN    WBC, Wet Prep HPF POC FEW (*) NONE SEEN   AMNISURE RUPTURE OF MEMBRANE (ROM)     Status: Normal   Collection Time   09/22/11 12:54 PM      Component Value Range   Amnisure ROM NEGATIVE    GLUCOSE TOLERANCE, 1 HOUR     Status: Normal   Collection Time   09/22/11  2:10 PM      Component Value Range   Glucose, 1 Hour GTT 123  70 - 140 (mg/dL)  CBC     Status: Abnormal   Collection Time   09/22/11  2:10 PM      Component Value Range   WBC 5.1  4.0 - 10.5 (K/uL)   RBC 3.07 (*) 3.87 - 5.11 (MIL/uL)   Hemoglobin 9.0 (*) 12.0 - 15.0 (g/dL)   HCT 26.9 (*) 36.0 - 46.0 (%)   MCV 87.6  78.0 - 100.0 (fL)   MCH 29.3  26.0 - 34.0 (pg)   MCHC 33.5  30.0 - 36.0 (g/dL)   RDW 14.2  11.5 - 15.5 (%)   Platelets 151  150 - 400 (K/uL)    DIFFERENTIAL     Status: Abnormal   Collection Time   09/22/11  2:10 PM      Component Value Range   Neutrophils Relative 82 (*) 43 - 77 (%)   Neutro Abs 4.2  1.7 - 7.7 (K/uL)   Lymphocytes Relative 9 (*) 12 - 46 (%)   Lymphs Abs 0.5 (*)  0.7 - 4.0 (K/uL)   Monocytes Relative 8  3 - 12 (%)   Monocytes Absolute 0.4  0.1 - 1.0 (K/uL)   Eosinophils Relative 0  0 - 5 (%)   Eosinophils Absolute 0.0  0.0 - 0.7 (K/uL)   Basophils Relative 0  0 - 1 (%)   Basophils Absolute 0.0  0.0 - 0.1 (K/uL)  ABO/RH     Status: Normal   Collection Time   09/22/11  2:10 PM      Component Value Range   ABO/RH(D) O POS         Assessment/Plan: 1. 39 week IUP 2. Prodromal vs early labor 3. FHR category II initially, improved to category I w/ PO fluids. Overall reassuring.  4. Very limited PNC 5. THC pos  Plan: 1. D/C home 2. F/U in [facility] in ~1 week 3. Labor precautions, FKCs    Jahnai Slingerland, Kinder 09/22/2011, 1:03 PM

## 2011-09-23 LAB — GC/CHLAMYDIA PROBE AMP, GENITAL: Chlamydia, DNA Probe: NEGATIVE

## 2011-09-24 ENCOUNTER — Encounter (HOSPITAL_COMMUNITY): Payer: Self-pay | Admitting: Anesthesiology

## 2011-09-24 ENCOUNTER — Inpatient Hospital Stay (HOSPITAL_COMMUNITY): Payer: Medicaid Other | Admitting: Anesthesiology

## 2011-09-24 ENCOUNTER — Encounter (HOSPITAL_COMMUNITY): Payer: Self-pay | Admitting: *Deleted

## 2011-09-24 ENCOUNTER — Inpatient Hospital Stay (HOSPITAL_COMMUNITY)
Admission: AD | Admit: 2011-09-24 | Discharge: 2011-09-26 | DRG: 775 | Disposition: A | Discharge status: Home / Self Care | Payer: Medicaid Other | Source: Ambulatory Visit | Attending: Obstetrics & Gynecology | Admitting: Obstetrics & Gynecology

## 2011-09-24 DIAGNOSIS — O0930 Supervision of pregnancy with insufficient antenatal care, unspecified trimester: Secondary | ICD-10-CM

## 2011-09-24 LAB — MRSA PCR SCREENING: MRSA by PCR: NEGATIVE

## 2011-09-24 LAB — CBC
HCT: 30.1 % — ABNORMAL LOW (ref 36.0–46.0)
Platelets: 182 10*3/uL (ref 150–400)
RBC: 3.51 MIL/uL — ABNORMAL LOW (ref 3.87–5.11)
RDW: 14.1 % (ref 11.5–15.5)
WBC: 6.4 10*3/uL (ref 4.0–10.5)

## 2011-09-24 LAB — SYPHILIS: RPR W/REFLEX TO RPR TITER AND TREPONEMAL ANTIBODIES, TRADITIONAL SCREENING AND DIAGNOSIS ALGORITHM: RPR Ser Ql: NONREACTIVE

## 2011-09-24 MED ORDER — OXYCODONE-ACETAMINOPHEN 5-325 MG PO TABS: 2.0000 | ORAL_TABLET | ORAL | Status: DC | PRN

## 2011-09-24 MED ORDER — ACETAMINOPHEN 325 MG PO TABS: 650.0000 mg | ORAL_TABLET | ORAL | Status: DC | PRN

## 2011-09-24 MED ORDER — FENTANYL 2.5 MCG/ML BUPIVACAINE 1/10 % EPIDURAL INFUSION (WH - ANES)
14.0000 mL/h | INTRAMUSCULAR | Status: DC
  Administered 2011-09-24: 14 mL/h via EPIDURAL
  Filled 2011-09-24 (×2): qty 60

## 2011-09-24 MED ORDER — DIPHENHYDRAMINE HCL 50 MG/ML IJ SOLN: 12.5000 mg | INTRAMUSCULAR | Status: DC | PRN

## 2011-09-24 MED ORDER — TETANUS-DIPHTH-ACELL PERTUSSIS 5-2.5-18.5 LF-MCG/0.5 IM SUSP
0.5000 mL | Freq: Once | INTRAMUSCULAR | Status: AC
  Administered 2011-09-26: 0.5 mL via INTRAMUSCULAR
  Filled 2011-09-24 (×2): qty 0.5

## 2011-09-24 MED ORDER — IBUPROFEN 600 MG PO TABS: 600.0000 mg | ORAL_TABLET | Freq: Four times a day (QID) | ORAL | Status: DC | PRN

## 2011-09-24 MED ORDER — DIPHENHYDRAMINE HCL 25 MG PO CAPS: 25.0000 mg | ORAL_CAPSULE | Freq: Four times a day (QID) | ORAL | Status: DC | PRN

## 2011-09-24 MED ORDER — PHENYLEPHRINE 40 MCG/ML (10ML) SYRINGE FOR IV PUSH (FOR BLOOD PRESSURE SUPPORT)
80.0000 ug | PREFILLED_SYRINGE | INTRAVENOUS | Status: DC | PRN
  Filled 2011-09-24 (×2): qty 5

## 2011-09-24 MED ORDER — EPHEDRINE 5 MG/ML INJ
10.0000 mg | INTRAVENOUS | Status: DC | PRN
  Filled 2011-09-24: qty 4

## 2011-09-24 MED ORDER — EPHEDRINE 5 MG/ML INJ
10.0000 mg | INTRAVENOUS | Status: DC | PRN
  Filled 2011-09-24 (×2): qty 4

## 2011-09-24 MED ORDER — BENZOCAINE-MENTHOL 20-0.5 % EX AERO
INHALATION_SPRAY | CUTANEOUS | Status: AC
  Filled 2011-09-24: qty 56

## 2011-09-24 MED ORDER — LACTATED RINGERS IV SOLN
500.0000 mL | Freq: Once | INTRAVENOUS | Status: AC
  Administered 2011-09-24: 1000 mL via INTRAVENOUS

## 2011-09-24 MED ORDER — ZOLPIDEM TARTRATE 5 MG PO TABS: 5.0000 mg | ORAL_TABLET | Freq: Every evening | ORAL | Status: DC | PRN

## 2011-09-24 MED ORDER — LACTATED RINGERS IV SOLN: 500.0000 mL | INTRAVENOUS | Status: DC | PRN

## 2011-09-24 MED ORDER — BENZOCAINE-MENTHOL 20-0.5 % EX AERO
1.0000 "application " | INHALATION_SPRAY | CUTANEOUS | Status: DC | PRN
  Administered 2011-09-24: 1 via TOPICAL

## 2011-09-24 MED ORDER — SIMETHICONE 80 MG PO CHEW: 80.0000 mg | CHEWABLE_TABLET | ORAL | Status: DC | PRN

## 2011-09-24 MED ORDER — LACTATED RINGERS IV SOLN
INTRAVENOUS | Status: DC
  Administered 2011-09-24: 09:00:00 via INTRAVENOUS

## 2011-09-24 MED ORDER — LIDOCAINE HCL (PF) 1 % IJ SOLN
30.0000 mL | INTRAMUSCULAR | Status: DC | PRN
  Filled 2011-09-24 (×2): qty 30

## 2011-09-24 MED ORDER — OXYTOCIN BOLUS FROM INFUSION
500.0000 mL | Freq: Once | INTRAVENOUS | Status: DC
  Filled 2011-09-24: qty 500
  Filled 2011-09-24: qty 1000

## 2011-09-24 MED ORDER — OXYTOCIN 20 UNITS IN LACTATED RINGERS INFUSION - SIMPLE
125.0000 mL/h | Freq: Once | INTRAVENOUS | Status: AC
  Administered 2011-09-24: 125 mL/h via INTRAVENOUS

## 2011-09-24 MED ORDER — ONDANSETRON HCL 4 MG/2ML IJ SOLN: 4.0000 mg | Freq: Four times a day (QID) | INTRAMUSCULAR | Status: DC | PRN

## 2011-09-24 MED ORDER — ONDANSETRON HCL 4 MG/2ML IJ SOLN: 4.0000 mg | INTRAMUSCULAR | Status: DC | PRN

## 2011-09-24 MED ORDER — PHENYLEPHRINE 40 MCG/ML (10ML) SYRINGE FOR IV PUSH (FOR BLOOD PRESSURE SUPPORT)
80.0000 ug | PREFILLED_SYRINGE | INTRAVENOUS | Status: DC | PRN
  Filled 2011-09-24: qty 5

## 2011-09-24 MED ORDER — OXYCODONE-ACETAMINOPHEN 5-325 MG PO TABS
1.0000 | ORAL_TABLET | ORAL | Status: DC | PRN
  Administered 2011-09-24 – 2011-09-25 (×3): 1 via ORAL
  Administered 2011-09-25: 2 via ORAL
  Administered 2011-09-25 – 2011-09-26 (×3): 1 via ORAL
  Filled 2011-09-24: qty 2
  Filled 2011-09-24 (×5): qty 1

## 2011-09-24 MED ORDER — SENNOSIDES-DOCUSATE SODIUM 8.6-50 MG PO TABS
2.0000 | ORAL_TABLET | Freq: Every day | ORAL | Status: DC
  Administered 2011-09-24 – 2011-09-25 (×2): 2 via ORAL

## 2011-09-24 MED ORDER — WITCH HAZEL-GLYCERIN EX PADS: 1.0000 "application " | MEDICATED_PAD | CUTANEOUS | Status: DC | PRN

## 2011-09-24 MED ORDER — LIDOCAINE HCL 1.5 % IJ SOLN
INTRAMUSCULAR | Status: DC | PRN
  Administered 2011-09-24 (×2): 5 mL via INTRADERMAL

## 2011-09-24 MED ORDER — DIBUCAINE 1 % RE OINT: 1.0000 "application " | TOPICAL_OINTMENT | RECTAL | Status: DC | PRN

## 2011-09-24 MED ORDER — LANOLIN HYDROUS EX OINT: TOPICAL_OINTMENT | CUTANEOUS | Status: DC | PRN

## 2011-09-24 MED ORDER — FLEET ENEMA 7-19 GM/118ML RE ENEM: 1.0000 | ENEMA | RECTAL | Status: DC | PRN

## 2011-09-24 MED ORDER — CITRIC ACID-SODIUM CITRATE 334-500 MG/5ML PO SOLN: 30.0000 mL | ORAL | Status: DC | PRN

## 2011-09-24 MED ORDER — ONDANSETRON HCL 4 MG PO TABS: 4.0000 mg | ORAL_TABLET | ORAL | Status: DC | PRN

## 2011-09-24 MED ORDER — IBUPROFEN 600 MG PO TABS
600.0000 mg | ORAL_TABLET | Freq: Four times a day (QID) | ORAL | Status: DC
  Administered 2011-09-24 – 2011-09-26 (×7): 600 mg via ORAL
  Filled 2011-09-24 (×6): qty 1

## 2011-09-24 MED ORDER — PRENATAL PLUS 27-1 MG PO TABS
1.0000 | ORAL_TABLET | Freq: Every day | ORAL | Status: DC
  Administered 2011-09-25 – 2011-09-26 (×2): 1 via ORAL
  Filled 2011-09-24 (×2): qty 1

## 2011-09-24 NOTE — Progress Notes (Signed)
Cathy [name] is a 29 y.o. [phone] at [redacted]w[redacted]d admitted for active labor. Just had epidural. No complaints at this time.  Subjective:   Objective: BP 127/92  Pulse 76  Temp(Src) 98.4 F (36.9 C) (Oral)  Resp 20  Ht 5\' 7"  (1.702 m)  Wt 73.029 kg (161 lb)  BMI 25.22 kg/m2  SpO2 100%      FHT:  FHR: 120-130 bpm, variability: moderate,  accelerations:  Present,  decelerations:  Absent UC:   regular, every 3-4 minutes SVE:   Dilation: 4.5 Effacement (%): 80 Station: -2 Exam by:: [email or web address], rn  Labs: Lab Results  Component Value Date   WBC 6.4 09/24/2011   HGB 10.0* 09/24/2011   HCT 30.1* 09/24/2011   MCV 85.8 09/24/2011   PLT 182 09/24/2011    Assessment / Plan: Spontaneous labor, progressing normally  Labor: Progressing normally Fetal Wellbeing:  Category I Pain Control:  Epidural I/D:  [name]/a (GBS pending, term. MRSA PCR pending as well) Anticipated MOD:  NSVD  Cathy [name] 09/24/2011, 9:24 AM

## 2011-09-24 NOTE — Anesthesia Procedure Notes (Signed)
Epidural Patient location during procedure: OB Start time: 09/24/2011 9:03 AM  Staffing Performed by: anesthesiologist   Preanesthetic Checklist Completed: patient identified, site marked, surgical consent, pre-op evaluation, timeout performed, IV checked, risks and benefits discussed and monitors and equipment checked  Epidural Patient position: sitting Prep: site prepped and draped and DuraPrep Patient monitoring: continuous pulse ox and blood pressure Approach: midline Injection technique: LOR air and LOR saline  Needle:  Needle type: Tuohy  Needle gauge: 17 G Needle length: 9 cm Needle insertion depth: 4 cm Catheter type: closed end flexible Catheter size: 19 Gauge Catheter at skin depth: 9 cm Test dose: negative  Assessment Events: blood not aspirated, injection not painful, no injection resistance, negative IV test and no paresthesia  Additional Notes Patient identified.  Risk benefits discussed including failed block, incomplete pain control, headache, nerve damage, paralysis, blood pressure changes, nausea, vomiting, reactions to medication both toxic or allergic, and postpartum back pain.  Patient expressed understanding and wished to proceed.  All questions were answered.  Sterile technique used throughout procedure and epidural site dressed with sterile barrier dressing. No paresthesia or other complications noted.The patient did not experience any signs of intravascular injection such as tinnitus or metallic taste in mouth nor signs of intrathecal spread such as rapid motor block. Please see nursing notes for vital signs.

## 2011-09-24 NOTE — Progress Notes (Signed)
UR chart review completed.  

## 2011-09-24 NOTE — H&P (Signed)
History    Chief Complaint   Patient presents with   .  Contractions    HPI  This is a 30 year old G5 P3 013 with intrauterine pregnancy at 39 weeks and 2 days who presents the MAU with contractions that started at 1:30 AM. The contractions have increased in intensity and are currently 2-3 minutes apart and rated by the patient has severe in intensity. She denies leaking fluids, decreased fetal activity, vaginal bleeding, vaginal discharge, abdominal pain, nausea, headache, vision changes. Her prenatal care has been palpated by insufficient prenatal care she was discharged from her previous prenatal provider in August due to multiple no-shows. She has not had care since then. She was seen recently in the MAU on Monday.  OB History    Grav  Para  Term  Preterm  Abortions  TAB  SAB  Ect  Mult  Living    5  3  3   0  1  0  1  0  0  3      Past Medical History   Diagnosis  Date   .  Depression    .  Bipolar depression    .  Asthma     Past Surgical History   Procedure  Date   .  No past surgeries     Family History   Problem  Relation  Age of Onset   .  Diabetes  Maternal Grandmother    .  Hypertension  Maternal Grandmother    .  Diabetes  Maternal Grandfather    .  Hypertension  Maternal Grandfather    .  Diabetes  Paternal Grandmother    .  Hypertension  Paternal Grandmother    .  Diabetes  Paternal Grandfather    .  Hypertension  Paternal Grandfather     History   Substance Use Topics   .  Smoking status:  Current Some Day Smoker -- 0.2 packs/day   .  Smokeless tobacco:  Never Used   .  Alcohol Use:  No    Allergies: No Known Allergies  Prescriptions prior to admission   Medication  Sig  Dispense  Refill   .  diphenhydramine-acetaminophen (TYLENOL PM) 25-500 MG TABS  Take 1-2 tablets by mouth at bedtime as needed. For pain/sleep     .  Hydrocortisone-Aloe Vera (HYDROCORTISONE-ALOE EX)  Apply 1 application topically 2 (two) times daily as needed. For eczema     .  prenatal  vitamin w/FE, FA (PRENATAL 1 + 1) 27-1 MG TABS  Take 1 tablet by mouth daily.      Review of Systems  All other systems reviewed and are negative.   Physical Exam   Blood pressure 131/78, pulse 69, resp. rate 24.  Physical Exam  Constitutional: She is oriented to person, place, and time. She appears well-developed and well-nourished.  HENT:  Head: Normocephalic and atraumatic.  Eyes: Pupils are equal, round, and reactive to light.  Neck: Normal range of motion. Neck supple.  Cardiovascular: Normal rate, regular rhythm and normal heart sounds.  Respiratory: Effort normal and breath sounds normal. No respiratory distress. She has no wheezes. She has no rales. She exhibits no tenderness.  GI: Soft. Bowel sounds are normal. She exhibits no distension and no mass. There is no tenderness. There is no rebound and no guarding.  Fundal height is term. Contractions are moderate in intensity by palpation. Baby is vertex by [employer]. Estimated fetal weight is 7-1/2 pounds.  Genitourinary:  Pelvis  is adequate for a vaginal delivery. Baby to be cephalic on vaginal exam.  Musculoskeletal: Normal range of motion. She exhibits no edema.  Neurological: She is alert and oriented to person, place, and time.  Skin: Skin is warm and dry. No rash noted. No erythema. No pallor.   Dilation: 4  Effacement (%): 80  Station: -1  Presentation: Vertex  Exam by:: Dr. Nehemiah Settle  Fetal heart tracing shows baseline rate of 140 and was category 1. Tocometry we'll was approximately every 2-3 minutes and regular.  Prenatal labs:  ABO, Rh: --/--/O POS (10/22 1410)  Antibody:  Rubella: immune  RPR: NON REACTIVE (10/22 1410)  HBsAg: NEGATIVE (10/22 1410)  HIV: neg  GBS: pending  Ultrasound was performed on 1022 that showed an AFI of 13.54 with an estimated fetal weight of 6 lbs. 15 oz. There was cephalic presentation. Placenta was posterior.  MAU Course   Procedures  Assessment and Plan   #4 30 year old G5  P3 013 with intrauterine pregnancy at 39 weeks and 2 days  #2 insufficient prenatal care  #3 GBS unknown  As the patient experiencing cervical change we'll admit the patient to labor and delivery and proceed with expectant management for a vaginal delivery. The pediatrician will be [facility] there released from the hospital. While in the hospital they will be seen by the teaching service. The patient wishes to breast-feed and bottlefeed. Patient does wish for bilateral tubal ligation and did sign papers at Dr. Marcheta Grammes office which we'll try to obtain. As the patient has GBS unknown will continue to look for this to the patient is not currently meet criteria for antibiotic prophylaxis.  Cathy [name] JEHIEL  09/24/2011, 7:30 AM

## 2011-09-24 NOTE — Progress Notes (Signed)
PT SAYS SHE DID GO TO DR MARSHALL- BUT MISSED TOO  MANY APPOINTMENTS- SO HE D/C HER FROM PRACTICE.  WHEN HERE ON Monday-  SAW FACULTY PRACTICE.

## 2011-09-24 NOTE — ED Provider Notes (Signed)
History     Chief Complaint  Patient presents with  . Contractions   HPI This is a 30 year old G5 P3 013 with intrauterine pregnancy at 39 weeks and 2 days who presents the MAU with contractions that started at 1:30 AM. The contractions have increased in intensity and are currently 2-3 minutes apart and rated by the patient has severe in intensity. She denies leaking fluids, decreased fetal activity, vaginal bleeding, vaginal discharge, abdominal pain, nausea, headache, vision changes. Her prenatal care has been palpated by insufficient prenatal care she was discharged from her previous prenatal provider in August due to multiple no-shows. She has not had care since then. She was seen recently in the MAU on Monday.  OB History    Grav Para Term Preterm Abortions TAB SAB Ect Mult Living   5 3 3  0 1 0 1 0 0 3      Past Medical History  Diagnosis Date  . Depression   . Bipolar depression   . Asthma     Past Surgical History  Procedure Date  . No past surgeries     Family History  Problem Relation Age of Onset  . Diabetes Maternal Grandmother   . Hypertension Maternal Grandmother   . Diabetes Maternal Grandfather   . Hypertension Maternal Grandfather   . Diabetes Paternal Grandmother   . Hypertension Paternal Grandmother   . Diabetes Paternal Grandfather   . Hypertension Paternal Grandfather     History  Substance Use Topics  . Smoking status: Current Some Day Smoker -- 0.2 packs/day  . Smokeless tobacco: Never Used  . Alcohol Use: No    Allergies: No Known Allergies  Prescriptions prior to admission  Medication Sig Dispense Refill  . diphenhydramine-acetaminophen (TYLENOL PM) 25-500 MG TABS Take 1-2 tablets by mouth at bedtime as needed. For pain/sleep       . Hydrocortisone-Aloe Vera (HYDROCORTISONE-ALOE EX) Apply 1 application topically 2 (two) times daily as needed. For eczema       . prenatal vitamin w/FE, FA (PRENATAL 1 + 1) 27-1 MG TABS Take 1 tablet by mouth  daily.          Review of Systems  All other systems reviewed and are negative.   Physical Exam   Blood pressure 131/78, pulse 69, resp. rate 24.  Physical Exam  Constitutional: She is oriented to person, place, and time. She appears well-developed and well-nourished.  HENT:  Head: Normocephalic and atraumatic.  Eyes: Pupils are equal, round, and reactive to light.  Neck: Normal range of motion. Neck supple.  Cardiovascular: Normal rate, regular rhythm and normal heart sounds.   Respiratory: Effort normal and breath sounds normal. No respiratory distress. She has no wheezes. She has no rales. She exhibits no tenderness.  GI: Soft. Bowel sounds are normal. She exhibits no distension and no mass. There is no tenderness. There is no rebound and no guarding.       Fundal height is term. Contractions are moderate in intensity by palpation. Baby is vertex by [employer]. Estimated fetal weight is 7-1/2 pounds.  Genitourinary:       Pelvis is adequate for a vaginal delivery. Baby to be cephalic on vaginal exam.  Musculoskeletal: Normal range of motion. She exhibits no edema.  Neurological: She is alert and oriented to person, place, and time.  Skin: Skin is warm and dry. No rash noted. No erythema. No pallor.   Dilation: 4 Effacement (%): 80 Station: -1 Presentation: Vertex Exam by:: Dr. Nehemiah Settle  Fetal heart tracing shows baseline rate of 140 and was category 1. Tocometry we'll was approximately every 2-3 minutes and regular.  Prenatal labs: ABO, Rh: --/--/O POS (10/22 1410) Antibody:   Rubella:  immune RPR: NON REACTIVE (10/22 1410)  HBsAg: NEGATIVE (10/22 1410)  HIV:   neg GBS:   pending  Ultrasound was performed on 1022 that showed an AFI of 13.54 with an estimated fetal weight of 6 lbs. 15 oz. There was cephalic presentation. Placenta was posterior. MAU Course  Procedures  Assessment and Plan  #34 30 year old G5 P3 013 with intrauterine pregnancy at 39 weeks and 2  days #2 insufficient prenatal care  #3 GBS unknown  As the patient experiencing cervical change we'll admit the patient to labor and delivery and proceed with expectant management for a vaginal delivery. The pediatrician will be [facility] 1 there released from the hospital. While in the hospital they will be seen by the teaching service. The patient wishes to breast-feed and bottlefeed. Patient does wish for bilateral tubal ligation and did sign papers at Dr. Marcheta Grammes office which we'll try to obtain. As the patient has GBS unknown will continue to look for this to the patient is not currently meet criteria for antibiotic prophylaxis.  Cathy [name] Cathy [name] 09/24/2011, 7:30 AM

## 2011-09-24 NOTE — Progress Notes (Signed)
PT ARRIVED   VIA  [facility]- [facility].Marland Kitchen  PAIN STARTED BAD AT 0130 DENIES HSV . HAS HX MRSA.-2008-  FROM BOIL L  BUTTOCKS

## 2011-09-24 NOTE — Anesthesia Preprocedure Evaluation (Signed)
Anesthesia Evaluation  Patient identified by MRN, date of birth, ID band Patient awake  General Assessment Comment  Reviewed: Allergy & Precautions, H&P , Patient's Chart, lab work & pertinent test results  Airway Mallampati: II TM Distance: >3 FB Neck ROM: full    Dental No notable dental hx.    Pulmonary asthma  clear to auscultation  Pulmonary exam normal       Cardiovascular regular Normal    Neuro/Psych Negative Neurological ROS  Negative Psych ROS   GI/Hepatic negative GI ROS Neg liver ROS    Endo/Other  Negative Endocrine ROS  Renal/GU negative Renal ROS     Musculoskeletal   Abdominal   Peds  Hematology negative hematology ROS (+)   Anesthesia Other Findings   Reproductive/Obstetrics (+) Pregnancy                           Anesthesia Physical Anesthesia Plan  ASA: II  Anesthesia Plan: Epidural   Post-op Pain Management:    Induction:   Airway Management Planned:   Additional Equipment:   Intra-op Plan:   Post-operative Plan:   Informed Consent: I have reviewed the patients History and Physical, chart, labs and discussed the procedure including the risks, benefits and alternatives for the proposed anesthesia with the patient or authorized representative who has indicated his/her understanding and acceptance.     Plan Discussed with:   Anesthesia Plan Comments:         Anesthesia Quick Evaluation

## 2011-09-24 NOTE — Progress Notes (Signed)
Cathy [name] is a 30 y.o. [phone] at [redacted]w[redacted]d  admitted for active labor. RN noted variables and checked cervix, only had BBOW palpated.  Objective: BP 126/83  Pulse 68  Temp(Src) 98.4 F (36.9 C) (Oral)  Resp 18  Ht 5\' 7"  (1.702 m)  Wt 73.029 kg (161 lb)  BMI 25.22 kg/m2  SpO2 100%  FHT:  FHR: 130s bpm, variability: moderate,  accelerations:  Present,  decelerations:  Present variables UC:   regular, every 1-3 minutes SVE:   Dilation: Lip/rim Effacement (%): 100 Station: -1 Exam by:: dr. Loann Quill  Labs: Lab Results  Component Value Date   WBC 6.4 09/24/2011   HGB 10.0* 09/24/2011   HCT 30.1* 09/24/2011   MCV 85.8 09/24/2011   PLT 182 09/24/2011    Assessment / Plan: Spontaneous labor, progressing normally AROM, clear; only anterior lip palpated after AROM Labor: Progressing normally Fetal Wellbeing:  Category II Pain Control:  Epidural I/D:  n/a Anticipated MOD:  NSVD  Kathrynne Kulinski N 09/24/2011, 12:16 PM

## 2011-09-25 LAB — CBC
HCT: 25.4 % — ABNORMAL LOW (ref 36.0–46.0)
MCH: 29.5 pg (ref 26.0–34.0)
MCV: 86.1 fL (ref 78.0–100.0)
Platelets: 137 10*3/uL — ABNORMAL LOW (ref 150–400)
RDW: 13.9 % (ref 11.5–15.5)
WBC: 6.5 10*3/uL (ref 4.0–10.5)

## 2011-09-25 LAB — CULTURE, BETA STREP (GROUP B ONLY)

## 2011-09-25 NOTE — Progress Notes (Signed)
Post Partum Day 1 Subjective: no complaints, voiding, tolerating PO and + flatus No BM Cramping pain continues but has reduced in tensity and is responsive to Motrin. Bleeding PV reduced and appropriate to PP day 1. She is eating well, moving and does not compliant of any fever, nausea, vomiting or lightheadedness. Her mood is stable and she report of good support. Baby is doing well, bottle fed and she attempted breast feeding. She plans to be seen at [facility] for her 6 week follow up and plans to have a TL done at that visit.  Objective: Blood pressure 117/76, pulse 60, temperature 97.6 F (36.4 C), temperature source Oral, resp. rate 18, height 5\' 7"  (1.702 m), weight 73.029 kg (161 lb), SpO2 100.00%, unknown if currently breastfeeding. - bottle, attempted breastfeeding without much success  Physical Exam:  General: alert, cooperative, appears stated age and no distress Uterine Fundus: firm DVT Evaluation: No evidence of DVT seen on physical exam.   Basename 09/25/11 0535 09/24/11 0745  HGB 8.7* 10.0*  HCT 25.4* 30.1*    Assessment/Plan: Plan for discharge tomorrow Ensure visit by Lactation team today   LOS: 1 day   Chetan Kapat 09/25/2011, 8:58 AM

## 2011-09-25 NOTE — Anesthesia Postprocedure Evaluation (Signed)
Anesthesia Post Note  Patient: Cathy [name]  Procedure(s) Performed: * No procedures listed *  Anesthesia type: Epidural  Patient location: Mother/Baby  Post pain: Pain level controlled  Post assessment: Post-op Vital signs reviewed  Last Vitals:  Filed Vitals:   09/25/11 0553  BP: 117/76  Pulse: 60  Temp: 36.4 C  Resp: 18    Post vital signs: Reviewed  Level of consciousness: awake  Complications: No apparent anesthesia complications

## 2011-09-25 NOTE — Progress Notes (Signed)
I have seen and examined this patient in conjunction with Chetan Kapat , PA-S.  I have taken this history and preformed the exam.  I agree with the note as written above and have made corrections as needed.   Loma Boston JEHIEL 09/25/2011 10:23 AM

## 2011-09-25 NOTE — Progress Notes (Signed)
Post Partum Day 1 Subjective: no complaints, up ad lib, voiding and tolerating PO.  Pain well controlled.  Objective: Blood pressure 117/76, pulse 60, temperature 97.6 F (36.4 C), temperature source Oral, resp. rate 18, height 5\' 7"  (1.702 m), weight 73.029 kg (161 lb), SpO2 100.00%, unknown if currently breastfeeding.  Physical Exam:  General: alert, cooperative and no distress Heart: regular rate, no murmur Lungs: clear to auscultation bilaterally, no wheezing. Lochia: appropriate Uterine Fundus: firm DVT Evaluation: No evidence of DVT seen on physical exam. No cords or calf tenderness. No significant calf/ankle edema.   Basename 09/25/11 0535 09/24/11 0745  HGB 8.7* 10.0*  HCT 25.4* 30.1*    Assessment/Plan: Plan for discharge tomorrow and Social Work consult.  Continue good pain control.     LOS: 1 day   STINSON, JACOB JEHIEL 09/25/2011, 10:21 AM

## 2011-09-25 NOTE — Progress Notes (Signed)
PSYCHOSOCIAL ASSESSMENT ~ MATERNAL/CHILD Name:   Cathy [name] [facility]                                                                    Age: 30   Referral Date:        10/ 25  / 12  Reason/Source: Social situation / CN  I. FAMILY/HOME ENVIRONMENT A. Child's Legal Guardian __X_Parent(s) ___Grandparent ___Foster parent ___DSS_________________ Name:  Cathy [name]                                     DOB: //                     Age: 62  Address: [address] [place], [place] [address]  Name: Cathy [name]                                      DOB: //                     Age: 80  Address: [address] ; [place], [place] [address] (pt plans to move to this address)  B. Other Household Members/Support Persons Name:                                         Relationship: FOB's mother                       DOB ___/___/___                   Name:                                         Relationship:                        DOB ___/___/___                   Name:                                         Relationship:                        DOB ___/___/___                   Name:                                         Relationship:                        DOB ___/___/___  C. Other Support:  II. PSYCHOSOCIAL DATA A. Information Source                                                                                             _X_Patient Interview  __Family Interview           __Other___________  B. Occupational hygienist __Employment: _X_Medicaid    [place]: [employer]                __Private Insurance:                   __Self Pay  _X_Food Public librarian (pending)   _X_WIC __[employer]     __Public Housing     __Section 8    __Maternity Care Coordination/Child Service Coordination/Early Intervention   ___School:                                                                         Grade:  __Other:   Cathy [name] Cultural and Environment Information Cultural Issues Impacting  Care:  III. STRENGTHS _X__Supportive family/friends _X__Adequate Resources ___Compliance with medical plan _X__Home prepared for Child (including basic supplies) ___Understanding of illness      ___Other: RISK FACTORS AND CURRENT PROBLEMS         ____No Problems Noted      PP depression Bipolar Limited PNC MJ use                                                                                                                                              IV. SOCIAL WORK ASSESSMENT  Sw met with pt to assess history of PP depression, bipolar disorder, limited PNC and MJ use.  Pt told Sw that she experienced PP depression after the birth of all of her children.  She remembers crying a lot and "getting angry for no reason."  She denies any history of SI/HI.  Pt was prescribed medication but did not take it.  Her bipolar symptoms were treated in the past @ age 45.  Pt told Sw that she didn't like how the medication made her feel and therefore was not willing to take medication in her adult years.  Pt received counseling at the [facility] in 2008 that she thought was  helpful.  She plans to contact the [facility] upon discharge to start counseling session again.  Pt currently has an open CPS case.  Cathy [name] is her worker.  Pt explained that her other 3 children are with their fathers due to her instability.  CPS worker plans to come meet with the pt to confirm where she and infant will discharge to.  Pt told Sw that she had a lapse in [facility] between August and October, after being dismissed from OBGYN due to a missed appointment.  She admits to smoking MJ, daily prior to pregnancy confirmation at 3 months.  Once pregnancy was confirmed she smoked "once a week," with last use being 2 1/2 weeks ago.  Sw explained hospital drug testing policy.  UDS is negative and meconium is pending.  She identified her family and FOB's family as support system.  FOB is at the bedside and supportive.  Pt reports  having all supplies for the infant.  Sw will follow up and assist until discharge as needed.         V. SOCIAL WORK PLAN  _X__No Further Intervention Required/No Barriers to Discharge   ___Psychosocial Support and Ongoing Assessment of Needs   ___Patient/Family Education:   ___Child Protective Services Report   County___________ Date___/____/____   ___Information/Referral to [employer] Resources_________________________   ___Other:

## 2011-09-26 MED ORDER — IBUPROFEN 600 MG PO TABS: 600.0000 mg | ORAL_TABLET | Freq: Four times a day (QID) | ORAL | Status: AC | PRN

## 2011-09-26 MED ORDER — INFLUENZA VIRUS VACC SPLIT PF IM SUSP
0.5000 mL | Freq: Once | INTRAMUSCULAR | Status: AC
  Administered 2011-09-26: 0.5 mL via INTRAMUSCULAR
  Filled 2011-09-26: qty 0.5

## 2011-09-26 MED ORDER — PNEUMOCOCCAL VAC POLYVALENT 25 MCG/0.5ML IJ INJ
0.5000 mL | INJECTION | Freq: Once | INTRAMUSCULAR | Status: AC
  Administered 2011-09-26: 0.5 mL via INTRAMUSCULAR
  Filled 2011-09-26: qty 0.5

## 2011-09-26 NOTE — Progress Notes (Signed)
Post Partum Day 2 Subjective: no complaints, up ad lib, voiding, tolerating PO and planning on BTL for contraception, needs to sign consent  Objective: Blood pressure 127/78, pulse 59, temperature 98.2 F (36.8 C), temperature source Oral, resp. rate 18, height 5\' 7"  (1.702 m), weight 73.029 kg (161 lb), SpO2 100.00%, unknown if currently breastfeeding.  Physical Exam:  General: alert, cooperative and no distress Lochia: appropriate Uterine Fundus: firm    Basename 09/25/11 0535 09/24/11 0745  HGB 8.7* 10.0*  HCT 25.4* 30.1*    Assessment/Plan: Discharge home and Contraception BTL at 6 weeks PP, sign consent prior to discharge, f/u in clinic    LOS: 2 days   Cathy [name] 09/26/2011, 6:40 AM

## 2011-09-26 NOTE — Discharge Summary (Signed)
Obstetric Discharge Summary Reason for Admission: onset of labor Prenatal Procedures: none Intrapartum Procedures: spontaneous vaginal delivery Postpartum Procedures: none Complications-Operative and Postpartum: none Hemoglobin  Date Value Range Status  09/25/2011 8.7* 12.0-15.0 (g/dL) Final     HCT  Date Value Range Status  09/25/2011 25.4* 36.0-46.0 (%) Final    Discharge Diagnoses: Term Pregnancy-delivered  Discharge Information: Date: 09/26/2011 Activity: pelvic rest Diet: routine Medications: Ibuprofen Condition: stable Instructions: routine PP instructions Discharge to: home Follow-up Information    Follow up with [facility]. Make an appointment in 3 weeks.   Contact information:   [place] [phone]         Newborn Data: Live born female  Birth Weight: 6 lb 12.6 oz (3079 g) APGAR: 9, 9  Home with mother.  Cathy [name] 09/26/2011, 6:37 AM

## 2011-09-27 NOTE — Discharge Summary (Signed)
Agree with above note.  LEGGETT,KELLY H. 09/27/2011 9:34 PM

## 2011-10-31 ENCOUNTER — Ambulatory Visit: Payer: Medicaid Other | Admitting: Obstetrics & Gynecology

## 2013-05-14 ENCOUNTER — Encounter (HOSPITAL_COMMUNITY): Payer: Self-pay | Admitting: Nurse Practitioner

## 2013-05-14 ENCOUNTER — Emergency Department (HOSPITAL_COMMUNITY)
Admission: EM | Admit: 2013-05-14 | Discharge: 2013-05-14 | Discharge status: Against Medical Advice | Payer: Medicaid Other | Attending: Emergency Medicine | Admitting: Emergency Medicine

## 2013-05-14 DIAGNOSIS — Y929 Unspecified place or not applicable: Secondary | ICD-10-CM | POA: Insufficient documentation

## 2013-05-14 DIAGNOSIS — Y939 Activity, unspecified: Secondary | ICD-10-CM | POA: Insufficient documentation

## 2013-05-14 DIAGNOSIS — F17200 Nicotine dependence, unspecified, uncomplicated: Secondary | ICD-10-CM | POA: Insufficient documentation

## 2013-05-14 DIAGNOSIS — J45909 Unspecified asthma, uncomplicated: Secondary | ICD-10-CM | POA: Insufficient documentation

## 2013-05-14 DIAGNOSIS — S91309A Unspecified open wound, unspecified foot, initial encounter: Secondary | ICD-10-CM | POA: Insufficient documentation

## 2013-05-14 DIAGNOSIS — W268XXA Contact with other sharp object(s), not elsewhere classified, initial encounter: Secondary | ICD-10-CM | POA: Insufficient documentation

## 2013-05-14 NOTE — ED Notes (Signed)
Call x2 no response.

## 2013-05-14 NOTE — ED Notes (Signed)
Called x 3 no answer 

## 2013-05-14 NOTE — ED Notes (Signed)
Per ems: pt called for lac to R foot on metal door. Reports tetanus is up to date. Wound cleaned and bandaged en route. Cms intact.

## 2013-05-14 NOTE — ED Notes (Signed)
Call x 1 to room no response

## 2014-04-11 ENCOUNTER — Inpatient Hospital Stay (HOSPITAL_COMMUNITY)
Admission: AD | Admit: 2014-04-11 | Discharge: 2014-04-11 | Disposition: A | Discharge status: Home / Self Care | Payer: Medicaid Other | Source: Ambulatory Visit | Attending: Obstetrics & Gynecology | Admitting: Obstetrics & Gynecology

## 2014-04-11 ENCOUNTER — Encounter (HOSPITAL_COMMUNITY): Payer: Self-pay | Admitting: *Deleted

## 2014-04-11 ENCOUNTER — Inpatient Hospital Stay (HOSPITAL_COMMUNITY): Payer: Medicaid Other

## 2014-04-11 DIAGNOSIS — R109 Unspecified abdominal pain: Secondary | ICD-10-CM | POA: Insufficient documentation

## 2014-04-11 DIAGNOSIS — N83209 Unspecified ovarian cyst, unspecified side: Secondary | ICD-10-CM | POA: Insufficient documentation

## 2014-04-11 DIAGNOSIS — F17200 Nicotine dependence, unspecified, uncomplicated: Secondary | ICD-10-CM | POA: Insufficient documentation

## 2014-04-11 DIAGNOSIS — N390 Urinary tract infection, site not specified: Secondary | ICD-10-CM

## 2014-04-11 HISTORY — DX: Trichomoniasis, unspecified: A59.9

## 2014-04-11 HISTORY — DX: Gonococcal infection, unspecified: A54.9

## 2014-04-11 HISTORY — DX: Female pelvic inflammatory disease, unspecified: N73.9

## 2014-04-11 LAB — COMPREHENSIVE METABOLIC PANEL
ALBUMIN: 3.6 g/dL (ref 3.5–5.2)
ALK PHOS: 68 U/L (ref 39–117)
ALT: 16 U/L (ref 0–35)
AST: 18 U/L (ref 0–37)
BUN: 11 mg/dL (ref 6–23)
CHLORIDE: 103 meq/L (ref 96–112)
CO2: 25 meq/L (ref 19–32)
Calcium: 9.1 mg/dL (ref 8.4–10.5)
Creatinine, Ser: 0.87 mg/dL (ref 0.50–1.10)
GFR calc Af Amer: >90 mL/min (ref >90)
GFR, EST NON AFRICAN AMERICAN: 89 mL/min — AB (ref >90)
Glucose, Bld: 98 mg/dL (ref 70–99)
POTASSIUM: 4.9 meq/L (ref 3.7–5.3)
SODIUM: 138 meq/L (ref 137–147)
Total Protein: 8.2 g/dL (ref 6.0–8.3)

## 2014-04-11 LAB — URINALYSIS, ROUTINE W REFLEX MICROSCOPIC
Bilirubin Urine: NEGATIVE
GLUCOSE, UA: NEGATIVE mg/dL
Hgb urine dipstick: NEGATIVE
Ketones, ur: NEGATIVE mg/dL
LEUKOCYTES UA: NEGATIVE
NITRITE: POSITIVE — AB
PROTEIN: NEGATIVE mg/dL
Specific Gravity, Urine: 1.02 (ref 1.005–1.030)
Urobilinogen, UA: 0.2 mg/dL (ref 0.0–1.0)
pH: 6.5 (ref 5.0–8.0)

## 2014-04-11 LAB — CBC
HCT: 34.5 % — ABNORMAL LOW (ref 36.0–46.0)
Hemoglobin: 11.5 g/dL — ABNORMAL LOW (ref 12.0–15.0)
MCH: 27.8 pg (ref 26.0–34.0)
MCHC: 33.3 g/dL (ref 30.0–36.0)
MCV: 83.5 fL (ref 78.0–100.0)
PLATELETS: 193 10*3/uL (ref 150–400)
RBC: 4.13 MIL/uL (ref 3.87–5.11)
RDW: 15.1 % (ref 11.5–15.5)
WBC: 6.4 10*3/uL (ref 4.0–10.5)

## 2014-04-11 LAB — WET PREP, GENITAL
Trich, Wet Prep: NONE SEEN
Yeast Wet Prep HPF POC: NONE SEEN

## 2014-04-11 LAB — HCG, SERUM, QUALITATIVE: Preg, Serum: NEGATIVE

## 2014-04-11 LAB — URINE MICROSCOPIC-ADD ON

## 2014-04-11 LAB — POCT PREGNANCY, URINE: PREG TEST UR: NEGATIVE

## 2014-04-11 MED ORDER — NITROFURANTOIN MONOHYD MACRO 100 MG PO CAPS: 100.0000 mg | ORAL_CAPSULE | Freq: Two times a day (BID) | ORAL | Status: DC

## 2014-04-11 MED ORDER — ONDANSETRON 8 MG PO TBDP
8.0000 mg | ORAL_TABLET | Freq: Once | ORAL | Status: AC
  Administered 2014-04-11: 8 mg via ORAL
  Filled 2014-04-11: qty 1

## 2014-04-11 MED ORDER — NITROFURANTOIN MONOHYD MACRO 100 MG PO CAPS
100.0000 mg | ORAL_CAPSULE | Freq: Once | ORAL | Status: AC
  Administered 2014-04-11: 100 mg via ORAL
  Filled 2014-04-11: qty 1

## 2014-04-11 MED ORDER — ONDANSETRON 8 MG PO TBDP: 8.0000 mg | ORAL_TABLET | Freq: Once | ORAL | Status: DC

## 2014-04-11 MED ORDER — KETOROLAC TROMETHAMINE 60 MG/2ML IM SOLN
60.0000 mg | Freq: Once | INTRAMUSCULAR | Status: AC
  Administered 2014-04-11: 60 mg via INTRAMUSCULAR
  Filled 2014-04-11: qty 2

## 2014-04-11 NOTE — MAU Provider Note (Signed)
History     CSN: [id number]  Arrival date and time: 04/11/14 1432   None     Chief Complaint  Patient presents with  . Abdominal Pain  . Back Pain  . Nausea  . Headache  . Possible Pregnancy   HPI  RN note: Patient states she has had abdominal and back pain for about 2 weeks. States she started a bad headache last night and is having a lot of nausea. Denies bleeding but has a little vaginal discharge with yellow color.        Past Medical History  Diagnosis Date  . Depression   . Bipolar depression   . Asthma     Past Surgical History  Procedure Laterality Date  . No past surgeries      Family History  Problem Relation Age of Onset  . Diabetes Maternal Grandmother   . Hypertension Maternal Grandmother   . Diabetes Maternal Grandfather   . Hypertension Maternal Grandfather   . Diabetes Paternal Grandmother   . Hypertension Paternal Grandmother   . Diabetes Paternal Grandfather   . Hypertension Paternal Grandfather     History  Substance Use Topics  . Smoking status: Current Some Day Smoker -- 0.25 packs/day  . Smokeless tobacco: Never Used  . Alcohol Use: No    Allergies: No Known Allergies  Prescriptions prior to admission  Medication Sig Dispense Refill  . diphenhydramine-acetaminophen (TYLENOL PM) 25-500 MG TABS Take 1-2 tablets by mouth at bedtime as needed. For pain/sleep       . Hydrocortisone-Aloe Vera (HYDROCORTISONE-ALOE EX) Apply 1 application topically 2 (two) times daily as needed. For eczema       . prenatal vitamin w/FE, FA (PRENATAL 1 + 1) 27-1 MG TABS Take 1 tablet by mouth daily.          Review of Systems  Constitutional: Negative for fever and chills.  Gastrointestinal: Positive for nausea and abdominal pain.  Genitourinary: Negative for dysuria and urgency.  Musculoskeletal: Negative for back pain.   Physical Exam   Blood pressure 117/70, pulse 66, temperature 99.3 F (37.4 C), temperature source Oral, resp. rate 16, height  5' 7.5" (1.715 m), weight 57.153 kg (126 lb), last menstrual period 02/12/2014, SpO2 100.00%, unknown if currently breastfeeding.  Physical Exam  Nursing note and vitals reviewed. Constitutional: She is oriented to person, place, and time. She appears well-developed and well-nourished. No distress.  HENT:  Head: Normocephalic.  Eyes: Pupils are equal, round, and reactive to light.  Neck: Normal range of motion. Neck supple.  Respiratory: Effort normal.  GI: Soft. Bowel sounds are normal. She exhibits no distension. There is tenderness. There is no rebound.  No CVA tenderness  Genitourinary: Vagina normal and uterus normal. No vaginal discharge found.  Tender right adnexa; no CMT  Musculoskeletal: Normal range of motion.  Neurological: She is alert and oriented to person, place, and time.  Skin: Skin is warm and dry.  Psychiatric: She has a normal mood and affect.    MAU Course  Procedures Results for orders placed during the hospital encounter of 04/11/14 (from the past 24 hour(s))  URINALYSIS, ROUTINE W REFLEX MICROSCOPIC     Status: Abnormal   Collection Time    04/11/14  2:55 PM      Result Value Ref Range   Color, Urine YELLOW  YELLOW   APPearance CLEAR  CLEAR   Specific Gravity, Urine 1.020  1.005 - 1.030   pH 6.5  5.0 - 8.0  Glucose, UA NEGATIVE  NEGATIVE mg/dL   Hgb urine dipstick NEGATIVE  NEGATIVE   Bilirubin Urine NEGATIVE  NEGATIVE   Ketones, ur NEGATIVE  NEGATIVE mg/dL   Protein, ur NEGATIVE  NEGATIVE mg/dL   Urobilinogen, UA 0.2  0.0 - 1.0 mg/dL   Nitrite POSITIVE (*) NEGATIVE   Leukocytes, UA NEGATIVE  NEGATIVE  URINE MICROSCOPIC-ADD ON     Status: Abnormal   Collection Time    04/11/14  2:55 PM      Result Value Ref Range   Squamous Epithelial / LPF FEW (*) RARE   WBC, UA 7-10  <3 WBC/hpf   Bacteria, UA MANY (*) RARE  POCT PREGNANCY, URINE     Status: None   Collection Time    04/11/14  3:50 PM      Result Value Ref Range   Preg Test, Ur NEGATIVE   NEGATIVE  WET PREP, GENITAL     Status: Abnormal   Collection Time    04/11/14  4:40 PM      Result Value Ref Range   Yeast Wet Prep HPF POC NONE SEEN  NONE SEEN   Trich, Wet Prep NONE SEEN  NONE SEEN   Clue Cells Wet Prep HPF POC FEW (*) NONE SEEN   WBC, Wet Prep HPF POC FEW (*) NONE SEEN  CBC     Status: Abnormal   Collection Time    04/11/14  4:46 PM      Result Value Ref Range   WBC 6.4  4.0 - 10.5 K/uL   RBC 4.13  3.87 - 5.11 MIL/uL   Hemoglobin 11.5 (*) 12.0 - 15.0 g/dL   HCT 34.5 (*) 36.0 - 46.0 %   MCV 83.5  78.0 - 100.0 fL   MCH 27.8  26.0 - 34.0 pg   MCHC 33.3  30.0 - 36.0 g/dL   RDW 15.1  11.5 - 15.5 %   Platelets 193  150 - 400 K/uL  COMPREHENSIVE METABOLIC PANEL     Status: Abnormal   Collection Time    04/11/14  4:46 PM      Result Value Ref Range   Sodium 138  137 - 147 mEq/L   Potassium 4.9  3.7 - 5.3 mEq/L   Chloride 103  96 - 112 mEq/L   CO2 25  19 - 32 mEq/L   Glucose, Bld 98  70 - 99 mg/dL   BUN 11  6 - 23 mg/dL   Creatinine, Ser 0.87  0.50 - 1.10 mg/dL   Calcium 9.1  8.4 - 10.5 mg/dL   Total Protein 8.2  6.0 - 8.3 g/dL   Albumin 3.6  3.5 - 5.2 g/dL   AST 18  0 - 37 U/L   ALT 16  0 - 35 U/L   Alkaline Phosphatase 68  39 - 117 U/L   Total Bilirubin <0.2 (*) 0.3 - 1.2 mg/dL   GFR calc non Af Amer 89 (*) >90 mL/min   GFR calc Af Amer >90  >90 mL/min  HCG, SERUM, QUALITATIVE     Status: None   Collection Time    04/11/14  4:46 PM      Result Value Ref Range   Preg, Serum NEGATIVE  NEGATIVE  US Transvaginal Non-ob  04/11/2014   CLINICAL DATA:  Right lower quadrant pain for 2 weeks  EXAM: TRANSABDOMINAL AND TRANSVAGINAL ULTRASOUND OF PELVIS  TECHNIQUE: Both transabdominal and transvaginal ultrasound examinations of the pelvis were performed. Transabdominal technique was performed for  global imaging of the pelvis including uterus, ovaries, adnexal regions, and pelvic cul-de-sac. It was necessary to proceed with endovaginal exam following the transabdominal  exam to visualize the uterus and endometrium.  COMPARISON:  None  FINDINGS: Uterus  Measurements: 9.2 x 4.4 x 4.4 cm. No fibroids or other mass visualized.  Endometrium  Thickness: 5 mm.  No focal abnormality visualized.  Right ovary  Measurements: 4.3 x 2.1 x 2.2 cm. Normal appearance/no adnexal mass.  Left ovary  Measurements: 5 x 3.9 x 3.1 cm. Complicated cyst in the left ovary measures 3.7 x 3.2 x 3.1 cm. This has a "Lace-like" internal architecture which likely represents a hemorrhagic cyst.  Other findings  No free fluid.  IMPRESSION: 1. Complicated cyst within the left ovary likely represents a hemorrhagic cyst. Short-interval follow up ultrasound in 6-12 weeks is recommended, preferably during the week following the patient's normal menses. 2. No findings to explain right lower quadrant pain.   Electronically Signed   By: Kerby Moors M.D.   On: 04/11/2014 18:16   US Pelvis Complete  04/11/2014   CLINICAL DATA:  Right lower quadrant pain for 2 weeks  EXAM: TRANSABDOMINAL AND TRANSVAGINAL ULTRASOUND OF PELVIS  TECHNIQUE: Both transabdominal and transvaginal ultrasound examinations of the pelvis were performed. Transabdominal technique was performed for global imaging of the pelvis including uterus, ovaries, adnexal regions, and pelvic cul-de-sac. It was necessary to proceed with endovaginal exam following the transabdominal exam to visualize the uterus and endometrium.  COMPARISON:  None  FINDINGS: Uterus  Measurements: 9.2 x 4.4 x 4.4 cm. No fibroids or other mass visualized.  Endometrium  Thickness: 5 mm.  No focal abnormality visualized.  Right ovary  Measurements: 4.3 x 2.1 x 2.2 cm. Normal appearance/no adnexal mass.  Left ovary  Measurements: 5 x 3.9 x 3.1 cm. Complicated cyst in the left ovary measures 3.7 x 3.2 x 3.1 cm. This has a "Lace-like" internal architecture which likely represents a hemorrhagic cyst.  Other findings  No free fluid.  IMPRESSION: 1. Complicated cyst within the left ovary  likely represents a hemorrhagic cyst. Short-interval follow up ultrasound in 6-12 weeks is recommended, preferably during the week following the patient's normal menses. 2. No findings to explain right lower quadrant pain.   Electronically Signed   By: Kerby Moors M.D.   On: 04/11/2014 18:16    Assessment and Plan  abd pain UTI- culture pending- Macrobid BID for 7 days- 1st dose in MAU Hemorrhagic left ovarian cyst- f/u in 6 -12weeks  W/US and GYN clinic appointment- message sent to clinic to contact pt for appt  West Pugh 04/11/2014, 3:53 PM

## 2014-04-11 NOTE — MAU Note (Signed)
Patient states she has had abdominal and back pain for about 2 weeks. States she started a bad headache last night and is having a lot of nausea. Denies bleeding but has a little vaginal discharge with yellow color.

## 2014-04-12 ENCOUNTER — Telehealth: Payer: Self-pay

## 2014-04-12 DIAGNOSIS — A749 Chlamydial infection, unspecified: Secondary | ICD-10-CM

## 2014-04-12 LAB — GC/CHLAMYDIA PROBE AMP
CT Probe RNA: POSITIVE — AB
GC Probe RNA: NEGATIVE

## 2014-04-12 MED ORDER — AZITHROMYCIN 500 MG PO TABS: 1000.0000 mg | ORAL_TABLET | Freq: Every day | ORAL | Status: DC

## 2014-04-12 NOTE — Telephone Encounter (Signed)
Zithromax 1gm PO e-prescribed to patient's pharmacy. Called and informed pt. Advised her to take on full stomach and to abstain from sexual intercourse for at least 7 days after treatment and to ensure partner is treated as well. Pt. Verbalized understanding. No questions or concerns.

## 2014-04-12 NOTE — MAU Provider Note (Signed)
Attestation of Attending Supervision of Advanced Practitioner (CNM/NP): Evaluation and management procedures were performed by the Advanced Practitioner under my supervision and collaboration.  I have reviewed the Advanced Practitioner's note and chart, and I agree with the management and plan.  Lavonia Drafts 3:35 PM

## 2014-04-12 NOTE — Telephone Encounter (Signed)
Message copied by Geanie Logan on Wed Apr 12, 2014 12:41 PM ------      Message from: Tarry Kos      Created: Wed Apr 12, 2014  8:48 AM       Patient returned call, notified her of positive chlamydia culture.  Patient will need Rx called in per protocol to [place].  Instructed patient to notify her partner for treatment. ------

## 2014-04-13 ENCOUNTER — Telehealth: Payer: Self-pay

## 2014-04-13 DIAGNOSIS — N83209 Unspecified ovarian cyst, unspecified side: Secondary | ICD-10-CM

## 2014-04-13 NOTE — Telephone Encounter (Signed)
Follow up US scheduled for June 18th at 1015. Called pt. No answer. Left message informing patient of both appointment times, dates, and locations, call clinic with questions.

## 2014-04-13 NOTE — Telephone Encounter (Signed)
Message copied by Geanie Logan on Thu Apr 13, 2014  8:03 AM ------      Message from: Ernie Avena      Created: Thu Apr 13, 2014  7:52 AM      Regarding: FW: GYN appt      Contact: [phone]       Patient has clinic appointment scheduled on 05/22/14 at 1:00p.            ----- Message -----         From: West Pugh, NP         Sent: 04/11/2014   6:27 PM           To: Beckie Busing Rassette, RN, [facility] Admin Pool      Subject: GYN appt                                                 Pt needs f/u US eval and GYN eval in ~6 weeks      Please contact pt for appointment      Thanks,      Sherolyn Buba       ------

## 2014-04-14 LAB — URINE CULTURE: Colony Count: >=100000

## 2014-05-01 ENCOUNTER — Emergency Department (HOSPITAL_COMMUNITY): Payer: Medicaid Other

## 2014-05-01 ENCOUNTER — Emergency Department (HOSPITAL_COMMUNITY)
Admission: EM | Admit: 2014-05-01 | Discharge: 2014-05-01 | Disposition: A | Discharge status: Home / Self Care | Payer: Medicaid Other | Attending: Emergency Medicine | Admitting: Emergency Medicine

## 2014-05-01 ENCOUNTER — Encounter (HOSPITAL_COMMUNITY): Payer: Self-pay | Admitting: Emergency Medicine

## 2014-05-01 DIAGNOSIS — Z8659 Personal history of other mental and behavioral disorders: Secondary | ICD-10-CM | POA: Insufficient documentation

## 2014-05-01 DIAGNOSIS — IMO0002 Reserved for concepts with insufficient information to code with codable children: Secondary | ICD-10-CM | POA: Insufficient documentation

## 2014-05-01 DIAGNOSIS — Z8742 Personal history of other diseases of the female genital tract: Secondary | ICD-10-CM | POA: Insufficient documentation

## 2014-05-01 DIAGNOSIS — F17200 Nicotine dependence, unspecified, uncomplicated: Secondary | ICD-10-CM | POA: Insufficient documentation

## 2014-05-01 DIAGNOSIS — Z8619 Personal history of other infectious and parasitic diseases: Secondary | ICD-10-CM | POA: Insufficient documentation

## 2014-05-01 DIAGNOSIS — Z79899 Other long term (current) drug therapy: Secondary | ICD-10-CM | POA: Insufficient documentation

## 2014-05-01 DIAGNOSIS — R079 Chest pain, unspecified: Secondary | ICD-10-CM

## 2014-05-01 DIAGNOSIS — J45901 Unspecified asthma with (acute) exacerbation: Secondary | ICD-10-CM | POA: Insufficient documentation

## 2014-05-01 DIAGNOSIS — L509 Urticaria, unspecified: Secondary | ICD-10-CM

## 2014-05-01 DIAGNOSIS — R071 Chest pain on breathing: Secondary | ICD-10-CM | POA: Insufficient documentation

## 2014-05-01 DIAGNOSIS — J45909 Unspecified asthma, uncomplicated: Secondary | ICD-10-CM

## 2014-05-01 DIAGNOSIS — Z3202 Encounter for pregnancy test, result negative: Secondary | ICD-10-CM | POA: Insufficient documentation

## 2014-05-01 LAB — BASIC METABOLIC PANEL
BUN: 14 mg/dL (ref 6–23)
CO2: 23 mEq/L (ref 19–32)
Calcium: 8.9 mg/dL (ref 8.4–10.5)
Chloride: 105 mEq/L (ref 96–112)
Creatinine, Ser: 0.79 mg/dL (ref 0.50–1.10)
GFR calc non Af Amer: >90 mL/min (ref >90)
Glucose, Bld: 111 mg/dL — ABNORMAL HIGH (ref 70–99)
POTASSIUM: 3.8 meq/L (ref 3.7–5.3)
SODIUM: 140 meq/L (ref 137–147)

## 2014-05-01 LAB — POC URINE PREG, ED: Preg Test, Ur: NEGATIVE

## 2014-05-01 LAB — URINALYSIS, ROUTINE W REFLEX MICROSCOPIC
BILIRUBIN URINE: NEGATIVE
Glucose, UA: NEGATIVE mg/dL
HGB URINE DIPSTICK: NEGATIVE
Ketones, ur: NEGATIVE mg/dL
Leukocytes, UA: NEGATIVE
Nitrite: NEGATIVE
Protein, ur: NEGATIVE mg/dL
SPECIFIC GRAVITY, URINE: 1.026 (ref 1.005–1.030)
UROBILINOGEN UA: 1 mg/dL (ref 0.0–1.0)
pH: 7 (ref 5.0–8.0)

## 2014-05-01 LAB — CBC
HCT: 33.7 % — ABNORMAL LOW (ref 36.0–46.0)
Hemoglobin: 11.2 g/dL — ABNORMAL LOW (ref 12.0–15.0)
MCH: 27.7 pg (ref 26.0–34.0)
MCHC: 33.2 g/dL (ref 30.0–36.0)
MCV: 83.4 fL (ref 78.0–100.0)
Platelets: 215 10*3/uL (ref 150–400)
RBC: 4.04 MIL/uL (ref 3.87–5.11)
RDW: 15.3 % (ref 11.5–15.5)
WBC: 5 10*3/uL (ref 4.0–10.5)

## 2014-05-01 LAB — PRO B NATRIURETIC PEPTIDE: Pro B Natriuretic peptide (BNP): 37.6 pg/mL (ref 0–125)

## 2014-05-01 LAB — I-STAT TROPONIN, ED: TROPONIN I, POC: 0 ng/mL (ref 0.00–0.08)

## 2014-05-01 LAB — D-DIMER, QUANTITATIVE (NOT AT ARMC)

## 2014-05-01 MED ORDER — ALBUTEROL SULFATE (2.5 MG/3ML) 0.083% IN NEBU: 2.5000 mg | INHALATION_SOLUTION | RESPIRATORY_TRACT | Status: DC | PRN

## 2014-05-01 MED ORDER — PREDNISONE 20 MG PO TABS: 60.0000 mg | ORAL_TABLET | Freq: Every day | ORAL | Status: DC

## 2014-05-01 MED ORDER — ALBUTEROL SULFATE HFA 108 (90 BASE) MCG/ACT IN AERS: 2.0000 | INHALATION_SPRAY | RESPIRATORY_TRACT | Status: DC | PRN

## 2014-05-01 NOTE — Discharge Instructions (Signed)
Asthma, Adult Asthma is a condition of the lungs in which the airways tighten and narrow. Asthma can make it hard to breathe. Asthma cannot be cured, but medicine and lifestyle changes can help control it. Asthma may be started (triggered) by:  Animal skin flakes (dander).  Dust.  Cockroaches.  Pollen.  Mold.  Smoke.  Cleaning products.  Hair sprays or aerosol sprays.  Paint fumes or strong smells.  Cold air, weather changes, and winds.  Crying or laughing hard.  Stress.  Certain medicines or drugs.  Foods, such as dried fruit, potato chips, and sparkling grape juice.  Infections or conditions (colds, flu).  Exercise.  Certain medical conditions or diseases.  Exercise or tiring activities. HOME CARE   Take medicine as told by your doctor.  Use a peak flow meter as told by your doctor. A peak flow meter is a tool that measures how well the lungs are working.  Record and keep track of the peak flow meter's readings.  Understand and use the asthma action plan. An asthma action plan is a written plan for taking care of your asthma and treating your attacks.  To help prevent asthma attacks:  Do not smoke. Stay away from secondhand smoke.  Change your heating and air conditioning filter often.  Limit your use of fireplaces and wood stoves.  Get rid of pests (such as roaches and mice) and their droppings.  Throw away plants if you see mold on them.  Clean your floors. Dust regularly. Use cleaning products that do not smell.  Have someone vacuum when you are not home. Use a vacuum cleaner with a HEPA filter if possible.  Replace carpet with wood, tile, or vinyl flooring. Carpet can trap animal skin flakes and dust.  Use allergy-proof pillows, mattress covers, and box spring covers.  Wash bed sheets and blankets every week in hot water and dry them in a dryer.  Use blankets that are made of polyester or cotton.  Clean bathrooms and kitchens with bleach.  If possible, have someone repaint the walls in these rooms with mold-resistant paint. Keep out of the rooms that are being cleaned and painted.  Wash hands often. GET HELP IF:  You have make a whistling sound when breaking (wheeze), have shortness of breath, or have a cough even if taking medicine to prevent attacks.  The colored mucus you cough up (sputum) is thicker than usual.  The colored mucus you cough up changes from clear or white to yellow, green, gray, or bloody.  You have problems from the medicine you are taking such as:  A rash.  Itching.  Swelling.  Trouble breathing.  You need reliever medicines more than 2 3 times a week.  Your peak flow measurement is still at 50 79% of your personal best after following the action plan for 1 hour. GET HELP RIGHT AWAY IF:   You seem to be worse and are not responding to medicine during an asthma attack.  You are short of breath even at rest.  You get short of breath when doing very little activity.  You have trouble eating, drinking, or talking.  You have chest pain.  You have a fast heartbeat.  Your lips or fingernails start to turn blue.  You are lightheaded, dizzy, or faint.  Your peak flow is less than 50% of your personal best.  You have a fever or lasting symptoms for more than 2 3 days.  You have a fever and your symptoms suddenly  get worse. MAKE SURE YOU:   Understand these instructions.  Will watch your condition.  Will get help right away if you are not doing well or get worse. Document Released: 05/05/2008 Document Revised: 09/07/2013 Document Reviewed: 06/16/2013 [facility] Patient Information 2014 [place], [place].  Chest Pain (Nonspecific) It is often hard to give a specific diagnosis for the cause of chest pain. There is always a chance that your pain could be related to something serious, such as a heart attack or a blood clot in the lungs. You need to follow up with your caregiver for further  evaluation. CAUSES   Heartburn.  Pneumonia or bronchitis.  Anxiety or stress.  Inflammation around your heart (pericarditis) or lung (pleuritis or pleurisy).  A blood clot in the lung.  A collapsed lung (pneumothorax). It can develop suddenly on its own (spontaneous pneumothorax) or from injury (trauma) to the chest.  Shingles infection (herpes zoster virus). The chest wall is composed of bones, muscles, and cartilage. Any of these can be the source of the pain.  The bones can be bruised by injury.  The muscles or cartilage can be strained by coughing or overwork.  The cartilage can be affected by inflammation and become sore (costochondritis). DIAGNOSIS  Lab tests or other studies, such as X-rays, electrocardiography, stress testing, or cardiac imaging, may be needed to find the cause of your pain.  TREATMENT   Treatment depends on what may be causing your chest pain. Treatment may include:  Acid blockers for heartburn.  Anti-inflammatory medicine.  Pain medicine for inflammatory conditions.  Antibiotics if an infection is present.  You may be advised to change lifestyle habits. This includes stopping smoking and avoiding alcohol, caffeine, and chocolate.  You may be advised to keep your head raised (elevated) when sleeping. This reduces the chance of acid going backward from your stomach into your esophagus.  Most of the time, nonspecific chest pain will improve within 2 to 3 days with rest and mild pain medicine. HOME CARE INSTRUCTIONS   If antibiotics were prescribed, take your antibiotics as directed. Finish them even if you start to feel better.  For the next few days, avoid physical activities that bring on chest pain. Continue physical activities as directed.  Do not smoke.  Avoid drinking alcohol.  Only take over-the-counter or prescription medicine for pain, discomfort, or fever as directed by your caregiver.  Follow your caregiver's suggestions for  further testing if your chest pain does not go away.  Keep any follow-up appointments you made. If you do not go to an appointment, you could develop lasting (chronic) problems with pain. If there is any problem keeping an appointment, you must call to reschedule. SEEK MEDICAL CARE IF:   You think you are having problems from the medicine you are taking. Read your medicine instructions carefully.  Your chest pain does not go away, even after treatment.  You develop a rash with blisters on your chest. SEEK IMMEDIATE MEDICAL CARE IF:   You have increased chest pain or pain that spreads to your arm, neck, jaw, back, or abdomen.  You develop shortness of breath, an increasing cough, or you are coughing up blood.  You have severe back or abdominal pain, feel nauseous, or vomit.  You develop severe weakness, fainting, or chills.  You have a fever. THIS IS AN EMERGENCY. Do not wait to see if the pain will go away. Get medical help at once. Call your local emergency services (911 in [place] Do not  drive yourself to the hospital. MAKE SURE YOU:   Understand these instructions.  Will watch your condition.  Will get help right away if you are not doing well or get worse. Document Released: 08/27/2005 Document Revised: 02/09/2012 Document Reviewed: 06/22/2008 [facility] Patient Information 2014 [place].

## 2014-05-01 NOTE — ED Notes (Signed)
Pt c/o chest pain x 1 month; reports worse at night; gets better if "tries to relax;" CP associated with shortness of breath, nausea, and heaviness. Also reports lower abd pain, LMP 4/29, unsure if pregnant. Pain associated with cramping; denies vag d/c.

## 2014-05-01 NOTE — ED Provider Notes (Signed)
CSN: [id number]     Arrival date & time 05/01/14  1526 History   First MD Initiated Contact with Patient 05/01/14 1838     Chief Complaint  Patient presents with  . Chest Pain  . Urticaria     (Consider location/radiation/quality/duration/timing/severity/associated sxs/prior Treatment) HPI Comments: Patient presents to the ER for evaluation of chest pain has been present for 2 weeks. Patient reports that she has noticed that the pain worsens when she exerts a subtle, but also when she breathes. She does have a history of asthma, has recently had to start using albuterol again. Symptoms improve slightly when she is a, but it makes her dizzy, so she tries not to. Pain is in the center of her chest, radiates through into the back. Never goes away, but does wax and wane.  Patient also complains of a rash. Patient reports that she has been noticing itchy rash that comes and goes on her face, torso and arms. This started when she started a new job. She is unaware of any new contacts, such as skin products, foods, medications.  Patient is a 33 y.o. female presenting with chest pain and urticaria.  Chest Pain Associated symptoms: shortness of breath   Urticaria Associated symptoms include chest pain and shortness of breath.    Past Medical History  Diagnosis Date  . Depression   . Bipolar depression   . Asthma   . Trichomonas infection   . Gonorrhea   . Pelvic inflammatory disease (PID)    Past Surgical History  Procedure Laterality Date  . No past surgeries     Family History  Problem Relation Age of Onset  . Diabetes Maternal Grandmother   . Hypertension Maternal Grandmother   . Diabetes Maternal Grandfather   . Hypertension Maternal Grandfather   . Diabetes Paternal Grandmother   . Hypertension Paternal Grandmother   . Diabetes Paternal Grandfather   . Hypertension Paternal Grandfather    History  Substance Use Topics  . Smoking status: Current Some Day Smoker -- 1.00  packs/day    Types: Cigarettes  . Smokeless tobacco: Never Used  . Alcohol Use: No   OB History   Grav Para Term Preterm Abortions TAB SAB Ect Mult Living   5 4 4  0 1 0 1 0 0 4     Review of Systems  Respiratory: Positive for shortness of breath.   Cardiovascular: Positive for chest pain.  All other systems reviewed and are negative.     Allergies  Review of patient's allergies indicates no known allergies.  Home Medications   Prior to Admission medications   Medication Sig Start Date End Date Taking? Authorizing Provider  albuterol (PROVENTIL HFA;VENTOLIN HFA) 108 (90 BASE) MCG/ACT inhaler Inhale 2 puffs into the lungs every 4 (four) hours as needed for wheezing or shortness of breath. 05/01/14   Orpah Greek, MD  albuterol (PROVENTIL) (2.5 MG/3ML) 0.083% nebulizer solution Take 3 mLs (2.5 mg total) by nebulization every 4 (four) hours as needed for wheezing or shortness of breath. 05/01/14   Orpah Greek, MD  azithromycin (ZITHROMAX) 500 MG tablet Take 2 tablets (1,000 mg total) by mouth daily. 04/12/14   Lavonia Drafts, MD  ibuprofen (ADVIL,MOTRIN) 200 MG tablet Take 400 mg by mouth every 6 (six) hours as needed for headache.    Historical Provider, MD  nitrofurantoin, macrocrystal-monohydrate, (MACROBID) 100 MG capsule Take 1 capsule (100 mg total) by mouth 2 (two) times daily. 04/11/14   West Pugh, NP  predniSONE (DELTASONE) 20 MG tablet Take 3 tablets (60 mg total) by mouth daily with breakfast. 05/01/14   Orpah Greek, MD   BP 113/79  Pulse 56  Temp(Src) 98.5 F (36.9 C)  Resp 16  Ht 5' 7.25" (1.708 m)  Wt 127 lb 8 oz (57.834 kg)  BMI 19.82 kg/m2  SpO2 100%  LMP 03/19/2014  Breastfeeding? No Physical Exam  Constitutional: She is oriented to person, place, and time. She appears well-developed and well-nourished. No distress.  HENT:  Head: Normocephalic and atraumatic.  Right Ear: Hearing normal.  Left Ear: Hearing normal.   Nose: Nose normal.  Mouth/Throat: Oropharynx is clear and moist and mucous membranes are normal.  Eyes: Conjunctivae and EOM are normal. Pupils are equal, round, and reactive to light.  Neck: Normal range of motion. Neck supple.  Cardiovascular: Regular rhythm, S1 normal and S2 normal.  Exam reveals no gallop and no friction rub.   No murmur heard. Pulmonary/Chest: Effort normal and breath sounds normal. No respiratory distress. She exhibits no tenderness.  Abdominal: Soft. Normal appearance and bowel sounds are normal. There is no hepatosplenomegaly. There is no tenderness. There is no rebound, no guarding, no tenderness at McBurney's point and negative Murphy's sign. No hernia.  Musculoskeletal: Normal range of motion.  Neurological: She is alert and oriented to person, place, and time. She has normal strength. No cranial nerve deficit or sensory deficit. Coordination normal. GCS eye subscore is 4. GCS verbal subscore is 5. GCS motor subscore is 6.  Skin: Skin is warm, dry and intact. Rash (a few urticaria on the arms) noted. No cyanosis.  Psychiatric: She has a normal mood and affect. Her speech is normal and behavior is normal. Thought content normal.    ED Course  Procedures (including critical care time) Labs Review Labs Reviewed  CBC - Abnormal; Notable for the following:    Hemoglobin 11.2 (*)    HCT 33.7 (*)    All other components within normal limits  BASIC METABOLIC PANEL - Abnormal; Notable for the following:    Glucose, Bld 111 (*)    All other components within normal limits  PRO B NATRIURETIC PEPTIDE  URINALYSIS, ROUTINE W REFLEX MICROSCOPIC  D-DIMER, QUANTITATIVE  I-STAT TROPOININ, ED  POC URINE PREG, ED    Imaging Review Dg Chest 2 View  05/01/2014   CLINICAL DATA:  Nausea and vomiting for 1 month. Generalized chest pain.  EXAM: CHEST  2 VIEW  COMPARISON:  01/15/2008  FINDINGS: The heart size and mediastinal contours are within normal limits. Both lungs are clear.  The visualized skeletal structures are unremarkable.  IMPRESSION: No active cardiopulmonary disease.   Electronically Signed   By: Lajean Manes M.D.   On: 05/01/2014 17:59     EKG Interpretation   Date/Time:  Monday May 01 2014 15:33:46 EDT Ventricular Rate:  80 PR Interval:  130 QRS Duration: 82 QT Interval:  352 QTC Calculation: 405 R Axis:   80 Text Interpretation:  Normal sinus rhythm Normal ECG Confirmed by Krysten Veronica   MD, Rafeal Skibicki [id number]) on 05/01/2014 7:04:14 PM      MDM   Final diagnoses:  Asthma  Chest pain  Urticaria   Patient presented to you for evaluation of chest pain. Patient does not have any significant cardiac factors other than cigarette smoking. Her EKG was normal. Troponin was negative. Patient does have a history of asthma which has been poorly treated over the last year or so. She started taking intermittent nebulizers,  medicine that she had in the home for unknown amount of time. She has been avoiding taking it because it makes her dizzy. I suspect that her pain is secondary to her asthma. I did add a d-dimer onto her blood work, although I feel she is very low risk for PE. Vital signs are normal, she has no leg swelling, PERC/Wells negative.  At this point, patient now wants to leave. She says she thinks it is her asthma as well and does not want to stay around for the d-dimer or any further testing. I did informed her of the risk of missing PE, she understands, but says she needs to get home to her kids. She will followup with her primary care physician, Doctor Criss Rosales. Patient was told she can return immediately to the ER at any time she has worsening symptoms. Patient will be treated with prednisone, albuterol.  Patient experiencing intermittent urticaria of unclear etiology. No breathing difficulty, no airway involvement. This has been present for a month. Should improve with prednisone.    Orpah Greek, MD 05/01/14 2008

## 2014-05-01 NOTE — ED Notes (Signed)
Dr Betsey Holiday in room

## 2014-05-01 NOTE — ED Notes (Signed)
Pt reports central CP that radiates to back x 2 weeks, worse with exertion. Pt reports SOB, nausea, diaphoresis, dizziness and lightheadedness. Pt in NAD. Lungs clear; speaks in complete sentences. Pt also reports itching rash to bilateral arms and legs x month. States she has had them ever since starting new job.

## 2014-05-01 NOTE — ED Notes (Signed)
Pt states that understanding of following up with her PCP.

## 2014-05-01 NOTE — ED Notes (Signed)
Pt requesting to leave AMA. States that she only wants to be treated for the asthma and hives. States that if she has a blood clot she does not want to be treated for it. States she needs to go home and take care of her children. Explained the risk of leaving and benefit of staying. Pt states understanding. Made Dr Betsey Holiday aware.

## 2014-05-18 ENCOUNTER — Ambulatory Visit (HOSPITAL_COMMUNITY): Admission: RE | Admit: 2014-05-18 | Payer: Medicaid Other | Source: Ambulatory Visit

## 2014-05-22 ENCOUNTER — Other Ambulatory Visit: Payer: Self-pay | Admitting: Obstetrics & Gynecology

## 2014-05-22 ENCOUNTER — Encounter: Payer: Self-pay | Admitting: *Deleted

## 2014-05-22 ENCOUNTER — Encounter: Payer: Self-pay | Admitting: Obstetrics & Gynecology

## 2014-05-22 ENCOUNTER — Encounter: Payer: Medicaid Other | Admitting: Obstetrics & Gynecology

## 2014-05-22 ENCOUNTER — Ambulatory Visit (HOSPITAL_COMMUNITY)
Admission: RE | Admit: 2014-05-22 | Discharge: 2014-05-22 | Disposition: A | Discharge status: Home / Self Care | Payer: Medicaid Other | Source: Ambulatory Visit | Attending: Obstetrics & Gynecology | Admitting: Obstetrics & Gynecology

## 2014-05-22 ENCOUNTER — Telehealth: Payer: Self-pay | Admitting: Obstetrics & Gynecology

## 2014-05-22 DIAGNOSIS — N83209 Unspecified ovarian cyst, unspecified side: Secondary | ICD-10-CM | POA: Diagnosis not present

## 2014-05-22 DIAGNOSIS — N7013 Chronic salpingitis and oophoritis: Secondary | ICD-10-CM | POA: Insufficient documentation

## 2014-05-22 DIAGNOSIS — R1031 Right lower quadrant pain: Secondary | ICD-10-CM | POA: Insufficient documentation

## 2014-05-22 DIAGNOSIS — Q5039 Other congenital malformation of ovary: Secondary | ICD-10-CM

## 2014-05-22 NOTE — Telephone Encounter (Signed)
Received message from call a nurse that patient will be running late. Called patient and got voicemail. Left message that appointments are up to 20 mins late before rescheduling.

## 2014-05-22 NOTE — Progress Notes (Signed)
Patient showed up 1 hour late to her appointment today. Advised patient that she needs to reschedule the ultrasound that she no showed for last week and then schedule appointment to see Angelene Rome. Patient given number to call radiology to reschedule.

## 2014-05-24 ENCOUNTER — Other Ambulatory Visit (HOSPITAL_COMMUNITY): Payer: Medicaid Other

## 2014-05-24 ENCOUNTER — Telehealth: Payer: Self-pay | Admitting: *Deleted

## 2014-05-24 NOTE — Telephone Encounter (Addendum)
Message copied by Langston Reusing on Wed May 24, 2014  4:44 PM ------      Message from: Lavonia Drafts      Created: Wed May 24, 2014  3:53 PM       Please call pt. Her sono reveals that her ovarian cyst has resolved.  She does have a hydrosalpinx (fluid in the fallopian tube) which is completely benign.            Thx,            clh-S  ------ Called pt and left message stating that I am calling with test results information. Please call back and state whether a detailed message may be left on your voice mail.  Diane Day RNC

## 2014-05-25 NOTE — Telephone Encounter (Signed)
Called patient and informed her of results. Patient verbalized understanding and had no questions. 

## 2014-05-25 NOTE — Telephone Encounter (Signed)
Called patient, no answer- left message that we are trying to reach you, please call us back at the clinics

## 2014-05-27 ENCOUNTER — Emergency Department (HOSPITAL_COMMUNITY)
Admission: EM | Admit: 2014-05-27 | Discharge: 2014-05-27 | Disposition: A | Discharge status: Home / Self Care | Payer: Medicaid Other | Attending: Emergency Medicine | Admitting: Emergency Medicine

## 2014-05-27 ENCOUNTER — Encounter (HOSPITAL_COMMUNITY): Payer: Self-pay | Admitting: Emergency Medicine

## 2014-05-27 DIAGNOSIS — F17200 Nicotine dependence, unspecified, uncomplicated: Secondary | ICD-10-CM | POA: Insufficient documentation

## 2014-05-27 DIAGNOSIS — Z79899 Other long term (current) drug therapy: Secondary | ICD-10-CM | POA: Insufficient documentation

## 2014-05-27 DIAGNOSIS — Z8659 Personal history of other mental and behavioral disorders: Secondary | ICD-10-CM | POA: Insufficient documentation

## 2014-05-27 DIAGNOSIS — L500 Allergic urticaria: Secondary | ICD-10-CM

## 2014-05-27 DIAGNOSIS — J45909 Unspecified asthma, uncomplicated: Secondary | ICD-10-CM | POA: Insufficient documentation

## 2014-05-27 DIAGNOSIS — Z8619 Personal history of other infectious and parasitic diseases: Secondary | ICD-10-CM | POA: Insufficient documentation

## 2014-05-27 DIAGNOSIS — IMO0002 Reserved for concepts with insufficient information to code with codable children: Secondary | ICD-10-CM | POA: Insufficient documentation

## 2014-05-27 DIAGNOSIS — Z8742 Personal history of other diseases of the female genital tract: Secondary | ICD-10-CM | POA: Insufficient documentation

## 2014-05-27 MED ORDER — FAMOTIDINE 20 MG PO TABS
20.0000 mg | ORAL_TABLET | Freq: Once | ORAL | Status: AC
Start: 2014-05-27 — End: 2014-05-27
  Administered 2014-05-27: 20 mg via ORAL
  Filled 2014-05-27: qty 1

## 2014-05-27 MED ORDER — HYDROXYZINE HCL 25 MG PO TABS
25.0000 mg | ORAL_TABLET | Freq: Once | ORAL | Status: AC
  Administered 2014-05-27: 25 mg via ORAL
  Filled 2014-05-27: qty 1

## 2014-05-27 MED ORDER — HYDROXYZINE HCL 25 MG PO TABS: 25.0000 mg | ORAL_TABLET | ORAL | Status: DC | PRN

## 2014-05-27 MED ORDER — FAMOTIDINE 20 MG PO TABS: 20.0000 mg | ORAL_TABLET | Freq: Two times a day (BID) | ORAL | Status: DC | PRN

## 2014-05-27 MED ORDER — DEXAMETHASONE SODIUM PHOSPHATE 10 MG/ML IJ SOLN
10.0000 mg | Freq: Once | INTRAMUSCULAR | Status: AC
  Administered 2014-05-27: 10 mg via INTRAMUSCULAR
  Filled 2014-05-27: qty 1

## 2014-05-27 NOTE — ED Provider Notes (Signed)
CSN: [id number]     Arrival date & time 05/27/14  1140 History  This chart was scribed for non-physician practitioner, Monico Blitz, PA-C,working with Dorie Rank, MD, by Marlowe Kays, ED Scribe.  This patient was seen in room WTR6/WTR6 and the patient's care was started at 12:06 PM.  Chief Complaint  Patient presents with  . Rash   The history is provided by the patient. No language interpreter was used.   HPI Comments:  NERIAH ARGETSINGER is a 33 y.o. female who presents to the Emergency Department complaining of a blotchy, itchy and mildly painful rash on her face, arms, and hands. She reports it has been appearing intermittently for the past year, with the warm weather making it worse. She reports using several different perfumes, body washes and detergents. Pt reports using Hydrocortisone Cream for the itching with minimal relief. She denies any contacts with anyone with similar symptoms. Pt denies any new SOB, difficulty swallowing, fever, nausea, vomiting, or CP. Pt reports her PCP is Dr Criss Rosales but states she has never seen her.  Past Medical History  Diagnosis Date  . Depression   . Bipolar depression   . Asthma   . Trichomonas infection   . Gonorrhea   . Pelvic inflammatory disease (PID)    Past Surgical History  Procedure Laterality Date  . No past surgeries     Family History  Problem Relation Age of Onset  . Diabetes Maternal Grandmother   . Hypertension Maternal Grandmother   . Diabetes Maternal Grandfather   . Hypertension Maternal Grandfather   . Diabetes Paternal Grandmother   . Hypertension Paternal Grandmother   . Diabetes Paternal Grandfather   . Hypertension Paternal Grandfather    History  Substance Use Topics  . Smoking status: Current Some Day Smoker -- 1.00 packs/day    Types: Cigarettes  . Smokeless tobacco: Never Used  . Alcohol Use: No   OB History   Grav Para Term Preterm Abortions TAB SAB Ect Mult Living   5 4 4  0 1 0 1 0 0 4     Review  of Systems A complete 10 system review of systems was obtained and all systems are negative except as noted in the HPI and PMH.   Allergies  Review of patient's allergies indicates no known allergies.  Home Medications   Prior to Admission medications   Medication Sig Start Date End Date Taking? Authorizing Angelette Ganus  albuterol (PROVENTIL HFA;VENTOLIN HFA) 108 (90 BASE) MCG/ACT inhaler Inhale 2 puffs into the lungs every 4 (four) hours as needed for wheezing or shortness of breath. 05/01/14  Yes Orpah Greek, MD  albuterol (PROVENTIL) (2.5 MG/3ML) 0.083% nebulizer solution Take 3 mLs (2.5 mg total) by nebulization every 4 (four) hours as needed for wheezing or shortness of breath. 05/01/14  Yes Orpah Greek, MD  clotrimazole (LOTRIMIN) 1 % cream Apply 1 application topically 2 (two) times daily as needed (itching).   Yes Historical Rita Prom, MD  hydrocortisone cream 0.5 % Apply 1 application topically 2 (two) times daily as needed for itching.   Yes Historical Shanee Batch, MD  ibuprofen (ADVIL,MOTRIN) 200 MG tablet Take 800 mg by mouth every 6 (six) hours as needed for headache.    Yes Historical Aanshi Batchelder, MD  Multiple Vitamin (MULTIVITAMIN WITH MINERALS) TABS tablet Take 2 tablets by mouth daily.   Yes Historical Kaspian Muccio, MD  famotidine (PEPCID) 20 MG tablet Take 1 tablet (20 mg total) by mouth 2 (two) times daily as needed (itching).  05/27/14   Nicole Pisciotta, PA-C  hydrOXYzine (ATARAX/VISTARIL) 25 MG tablet Take 1 tablet (25 mg total) by mouth every 4 (four) hours as needed for itching. 05/27/14   Nicole Pisciotta, PA-C   Triage Vitals: BP 116/75  Pulse 58  Temp(Src) 98.2 F (36.8 C) (Oral)  Resp 18  SpO2 100%  LMP 03/19/2014 Physical Exam  Nursing note and vitals reviewed. Constitutional: She is oriented to person, place, and time. She appears well-developed and well-nourished. No distress.  HENT:  Head: Normocephalic and atraumatic.  Mouth/Throat: Oropharynx is clear  and moist.  No stridor or drooling. No posterior pharynx edema, lip or tongue swelling. Pt reclining comfortably, speaking in complete sentences.   No Wheezing, excellent air movement in all fields.     Eyes: Conjunctivae and EOM are normal. Pupils are equal, round, and reactive to light.  Neck: Normal range of motion.  Cardiovascular: Normal rate, regular rhythm and intact distal pulses.   Pulmonary/Chest: Effort normal and breath sounds normal. No stridor. No respiratory distress. She has no wheezes. She has no rales. She exhibits no tenderness.  Abdominal: Soft. Bowel sounds are normal. She exhibits no distension and no mass. There is no tenderness. There is no rebound and no guarding.  Musculoskeletal: Normal range of motion.  Neurological: She is alert and oriented to person, place, and time.  Skin: Skin is warm and dry.  Urticarial hives scattered on 4 extremities and torso. This spares the mucous membranes and palms and soles. No warmth, discharge or induration or tenderness to palpation.  Psychiatric: She has a normal mood and affect. Her behavior is normal.    ED Course  Procedures (including critical care time) DIAGNOSTIC STUDIES: Oxygen Saturation is 100% on RA, normal by my interpretation.   COORDINATION OF CARE: 12:12 PM- Will refer to allergist. Will give injection of Decadron and prescribe Hydroxyzine and Pepcid. Pt verbalizes understanding and agrees to plan.  Medications  dexamethasone (DECADRON) injection 10 mg (not administered)  hydrOXYzine (ATARAX/VISTARIL) tablet 25 mg (not administered)  famotidine (PEPCID) tablet 20 mg (not administered)    Labs Review Labs Reviewed - No data to display  Imaging Review No results found.   EKG Interpretation None      MDM   Final diagnoses:  Allergic urticaria    Filed Vitals:   05/27/14 1203 05/27/14 1205  BP:  116/75  Pulse: 58   Temp: 98.2 F (36.8 C)   TempSrc: Oral   Resp: 18   SpO2: 100%      Medications  dexamethasone (DECADRON) injection 10 mg (not administered)  hydrOXYzine (ATARAX/VISTARIL) tablet 25 mg (not administered)  famotidine (PEPCID) tablet 20 mg (not administered)    KYLLIE LEAS is a 33 y.o. female presenting with Does not meet criteria for anaphylaxis and epinephrine administration: no multisystem involvement, airway compromise or hypotension. Pt given  decadron, atarax and pepcid with significant improvement in ED. Discharged with meds for symptom control and referral for allergy testing.   Evaluation does not show pathology that would require ongoing emergent intervention or inpatient treatment. Pt is hemodynamically stable and mentating appropriately. Discussed findings and plan with patient/guardian, who agrees with care plan. All questions answered. Return precautions discussed and outpatient follow up given.   New Prescriptions   FAMOTIDINE (PEPCID) 20 MG TABLET    Take 1 tablet (20 mg total) by mouth 2 (two) times daily as needed (itching).   HYDROXYZINE (ATARAX/VISTARIL) 25 MG TABLET    Take 1 tablet (25 mg total)  by mouth every 4 (four) hours as needed for itching.       I personally performed the services described in this documentation, which was scribed in my presence. The recorded information has been reviewed and is accurate.    Monico Blitz, PA-C 05/27/14 1249

## 2014-05-27 NOTE — ED Provider Notes (Signed)
Medical screening examination/treatment/procedure(s) were performed by non-physician practitioner and as supervising physician I was immediately available for consultation/collaboration.   Dorie Rank, MD 05/27/14 1257

## 2014-05-27 NOTE — ED Notes (Addendum)
Initial contact-pt has been having trouble with this rash for approx a year. Has generalized rash that "itches and hurts when you touch them." Rash is also noted on face and eyes are irritated. Areas are red and raised. Has not taken any medications today for rash. Has used hydrocortisone cream and "some kind of [facility] store brand fungus cream" but they don't work. Says she has a partner and child. She has physical contact with them and they do not have the rash. Has not seen the dermatologist for this. No vision changes reported and no fevers, chills, vomiting or diarrhea. Has had nausea. Took 800mg  ibuprofen around 0800. In NAD. Awaiting MD.

## 2014-05-27 NOTE — Discharge Instructions (Signed)
Please follow with your primary care doctor in the next 2 days for a check-up. They must obtain records for further management.   Do not hesitate to return to the Emergency Department for any new, worsening or concerning symptoms.    Hives Hives are itchy, red, swollen areas of the skin. They can vary in size and location on your body. Hives can come and go for hours or several days (acute hives) or for several weeks (chronic hives). Hives do not spread from person to person (noncontagious). They may get worse with scratching, exercise, and emotional stress. CAUSES   Allergic reaction to food, additives, or drugs.  Infections, including the common cold.  Illness, such as vasculitis, lupus, or thyroid disease.  Exposure to sunlight, heat, or cold.  Exercise.  Stress.  Contact with chemicals. SYMPTOMS   Red or white swollen patches on the skin. The patches may change size, shape, and location quickly and repeatedly.  Itching.  Swelling of the hands, feet, and face. This may occur if hives develop deeper in the skin. DIAGNOSIS  Your caregiver can usually tell what is wrong by performing a physical exam. Skin or blood tests may also be done to determine the cause of your hives. In some cases, the cause cannot be determined. TREATMENT  Mild cases usually get better with medicines such as antihistamines. Severe cases may require an emergency epinephrine injection. If the cause of your hives is known, treatment includes avoiding that trigger.  HOME CARE INSTRUCTIONS   Avoid causes that trigger your hives.  Take antihistamines as directed by your caregiver to reduce the severity of your hives. Non-sedating or low-sedating antihistamines are usually recommended. Do not drive while taking an antihistamine.  Take any other medicines prescribed for itching as directed by your caregiver.  Wear loose-fitting clothing.  Keep all follow-up appointments as directed by your caregiver. SEEK  MEDICAL CARE IF:   You have persistent or severe itching that is not relieved with medicine.  You have painful or swollen joints. SEEK IMMEDIATE MEDICAL CARE IF:   You have a fever.  Your tongue or lips are swollen.  You have trouble breathing or swallowing.  You feel tightness in the throat or chest.  You have abdominal pain. These problems may be the first sign of a life-threatening allergic reaction. Call your local emergency services (911 in [place] MAKE SURE YOU:   Understand these instructions.  Will watch your condition.  Will get help right away if you are not doing well or get worse. Document Released: 11/17/2005 Document Revised: 11/22/2013 Document Reviewed: 02/10/2012 [facility] Patient Information 2015 [place], [place]. This information is not intended to replace advice given to you by your health care provider. Make sure you discuss any questions you have with your health care provider.

## 2014-06-08 ENCOUNTER — Encounter: Payer: Medicaid Other | Admitting: Family Medicine

## 2014-10-02 ENCOUNTER — Encounter (HOSPITAL_COMMUNITY): Payer: Self-pay | Admitting: Emergency Medicine

## 2014-11-10 ENCOUNTER — Ambulatory Visit: Payer: Medicaid Other | Admitting: Family Medicine

## 2015-03-13 ENCOUNTER — Encounter (HOSPITAL_COMMUNITY): Payer: Self-pay | Admitting: Emergency Medicine

## 2015-03-13 ENCOUNTER — Other Ambulatory Visit: Payer: Self-pay

## 2015-03-13 ENCOUNTER — Emergency Department (HOSPITAL_COMMUNITY)
Admission: EM | Admit: 2015-03-13 | Discharge: 2015-03-13 | Disposition: A | Discharge status: Home / Self Care | Payer: Medicaid Other | Attending: Emergency Medicine | Admitting: Emergency Medicine

## 2015-03-13 ENCOUNTER — Emergency Department (HOSPITAL_COMMUNITY): Payer: Medicaid Other

## 2015-03-13 DIAGNOSIS — J45901 Unspecified asthma with (acute) exacerbation: Secondary | ICD-10-CM | POA: Insufficient documentation

## 2015-03-13 DIAGNOSIS — Z8619 Personal history of other infectious and parasitic diseases: Secondary | ICD-10-CM | POA: Diagnosis not present

## 2015-03-13 DIAGNOSIS — Z79899 Other long term (current) drug therapy: Secondary | ICD-10-CM | POA: Insufficient documentation

## 2015-03-13 DIAGNOSIS — Z3202 Encounter for pregnancy test, result negative: Secondary | ICD-10-CM | POA: Insufficient documentation

## 2015-03-13 DIAGNOSIS — Z8742 Personal history of other diseases of the female genital tract: Secondary | ICD-10-CM | POA: Diagnosis not present

## 2015-03-13 DIAGNOSIS — M940 Chondrocostal junction syndrome [Tietze]: Secondary | ICD-10-CM | POA: Insufficient documentation

## 2015-03-13 DIAGNOSIS — R0602 Shortness of breath: Secondary | ICD-10-CM | POA: Diagnosis present

## 2015-03-13 DIAGNOSIS — Z720 Tobacco use: Secondary | ICD-10-CM | POA: Insufficient documentation

## 2015-03-13 DIAGNOSIS — F3130 Bipolar disorder, current episode depressed, mild or moderate severity, unspecified: Secondary | ICD-10-CM | POA: Insufficient documentation

## 2015-03-13 DIAGNOSIS — M791 Myalgia: Secondary | ICD-10-CM | POA: Insufficient documentation

## 2015-03-13 LAB — COMPREHENSIVE METABOLIC PANEL
ALT: 17 U/L (ref 0–35)
AST: 26 U/L (ref 0–37)
Albumin: 4.3 g/dL (ref 3.5–5.2)
Alkaline Phosphatase: 67 U/L (ref 39–117)
Anion gap: 13 (ref 5–15)
BILIRUBIN TOTAL: 0.5 mg/dL (ref 0.3–1.2)
BUN: 10 mg/dL (ref 6–23)
CHLORIDE: 102 mmol/L (ref 96–112)
CO2: 21 mmol/L (ref 19–32)
Calcium: 9.2 mg/dL (ref 8.4–10.5)
Creatinine, Ser: 0.99 mg/dL (ref 0.50–1.10)
GFR calc non Af Amer: 75 mL/min — ABNORMAL LOW (ref >90)
GFR, EST AFRICAN AMERICAN: 87 mL/min — AB (ref >90)
GLUCOSE: 98 mg/dL (ref 70–99)
Potassium: 3.9 mmol/L (ref 3.5–5.1)
SODIUM: 136 mmol/L (ref 135–145)
Total Protein: 8.4 g/dL — ABNORMAL HIGH (ref 6.0–8.3)

## 2015-03-13 LAB — PREGNANCY, URINE: Preg Test, Ur: NEGATIVE

## 2015-03-13 LAB — CBC WITH DIFFERENTIAL/PLATELET
Basophils Absolute: 0 10*3/uL (ref 0.0–0.1)
Basophils Relative: 0 % (ref 0–1)
Eosinophils Absolute: 0 10*3/uL (ref 0.0–0.7)
Eosinophils Relative: 0 % (ref 0–5)
HEMATOCRIT: 36.8 % (ref 36.0–46.0)
Hemoglobin: 12.4 g/dL (ref 12.0–15.0)
LYMPHS PCT: 26 % (ref 12–46)
Lymphs Abs: 1.8 10*3/uL (ref 0.7–4.0)
MCH: 28.8 pg (ref 26.0–34.0)
MCHC: 33.7 g/dL (ref 30.0–36.0)
MCV: 85.6 fL (ref 78.0–100.0)
MONO ABS: 0.7 10*3/uL (ref 0.1–1.0)
MONOS PCT: 11 % (ref 3–12)
NEUTROS ABS: 4.3 10*3/uL (ref 1.7–7.7)
Neutrophils Relative %: 63 % (ref 43–77)
Platelets: 239 10*3/uL (ref 150–400)
RBC: 4.3 MIL/uL (ref 3.87–5.11)
RDW: 13.4 % (ref 11.5–15.5)
WBC: 6.9 10*3/uL (ref 4.0–10.5)

## 2015-03-13 MED ORDER — ALBUTEROL SULFATE HFA 108 (90 BASE) MCG/ACT IN AERS: 2.0000 | INHALATION_SPRAY | RESPIRATORY_TRACT | Status: DC | PRN

## 2015-03-13 MED ORDER — IPRATROPIUM-ALBUTEROL 0.5-2.5 (3) MG/3ML IN SOLN
3.0000 mL | Freq: Once | RESPIRATORY_TRACT | Status: AC
  Administered 2015-03-13: 3 mL via RESPIRATORY_TRACT
  Filled 2015-03-13: qty 3

## 2015-03-13 MED ORDER — NAPROXEN 500 MG PO TABS: 500.0000 mg | ORAL_TABLET | Freq: Two times a day (BID) | ORAL | Status: DC

## 2015-03-13 MED ORDER — ALBUTEROL SULFATE (2.5 MG/3ML) 0.083% IN NEBU
5.0000 mg | INHALATION_SOLUTION | Freq: Once | RESPIRATORY_TRACT | Status: AC
  Administered 2015-03-13: 5 mg via RESPIRATORY_TRACT
  Filled 2015-03-13: qty 6

## 2015-03-13 MED ORDER — KETOROLAC TROMETHAMINE 30 MG/ML IJ SOLN
30.0000 mg | Freq: Once | INTRAMUSCULAR | Status: AC
  Administered 2015-03-13: 30 mg via INTRAVENOUS
  Filled 2015-03-13: qty 1

## 2015-03-13 MED ORDER — SODIUM CHLORIDE 0.9 % IV BOLUS (SEPSIS)
1000.0000 mL | INTRAVENOUS | Status: AC
  Administered 2015-03-13: 1000 mL via INTRAVENOUS

## 2015-03-13 NOTE — ED Notes (Signed)
Called multiple times for triage without response

## 2015-03-13 NOTE — ED Notes (Signed)
This RN heard patient yelling at someone outside of room.  This RN went in to room to verify everything was okay.  Pt was on phone with unknown person.  This RN spent time with patient discussing personal situation and current psychiatric treatment and providing comfort.  Pt admitted to take current prescribed medications more than she should be.  NP notified.

## 2015-03-13 NOTE — ED Notes (Signed)
Pt. reports SOB and left posterior rib cage pain onset today , denies injury , alert and oriented , O2 sat = 99% room air at triage , agitated/hyperventilating  at arrival , poor historian at triage .

## 2015-03-13 NOTE — ED Notes (Signed)
Security reports that pt was across street and then walked back to EMS doors. Staff found pt on floor crying and upset.

## 2015-03-13 NOTE — Discharge Instructions (Signed)
Please follow the directions provided. Use the resource guide or the referral provided to follow-up with primary care doctor to ensure you're getting better. May use the albuterol 2 puffs every 4 hours to help with wheezing, shortness of breath or coughing. You may use the naproxen twice a day to help with the inflammation and pain. Don't hesitate to return for any new, worsening, or concerning symptoms.   SEEK IMMEDIATE MEDICAL CARE IF:  Your pain increases, or you are very uncomfortable.  You have a fever.  Your chest pain becomes worse.  You have new, unexplained symptoms.  You have nausea or vomiting.  You feel sweaty or lightheaded.  You have a cough with phlegm (sputum), or you cough up blood.    Emergency Department Resource Guide 1) Find a Doctor and Pay Out of Pocket Although you won't have to find out who is covered by your insurance plan, it is a good idea to ask around and get recommendations. You will then need to call the office and see if the doctor you have chosen will accept you as a new patient and what types of options they offer for patients who are self-pay. Some doctors offer discounts or will set up payment plans for their patients who do not have insurance, but you will need to ask so you aren't surprised when you get to your appointment.  2) Contact Your Local Health Department Not all health departments have doctors that can see patients for sick visits, but many do, so it is worth a call to see if yours does. If you don't know where your local health department is, you can check in your phone book. The CDC also has a tool to help you locate your state's health department, and many state websites also have listings of all of their local health departments.  3) Find a [facility] If your illness is not likely to be very severe or complicated, you may want to try a walk in clinic. These are popping up all over the country in pharmacies, drugstores, and shopping  centers. They're usually staffed by nurse practitioners or physician assistants that have been trained to treat common illnesses and complaints. They're usually fairly quick and inexpensive. However, if you have serious medical issues or chronic medical problems, these are probably not your best option.  No Primary Care Doctor: - Call Health Connect at  [phone] - they can help you locate a primary care doctor that  accepts your insurance, provides certain services, etc. - Physician Referral Service- [phone]  Chronic Pain Problems: Organization         Address  Phone   Notes  [facility]  [phone] Patients need to be referred by their primary care doctor.   Medication Assistance: Organization         Address  Phone   Notes  [facility] Medication [facility] [address] [place], [place] [address] [phone] --Must be a resident of [facility] -- Must have NO insurance coverage whatsoever (no Medicaid/ Medicare, etc.) -- The pt. MUST have a primary care doctor that directs their care regularly and follows them in the community   Med[facility]ist  [phone]   [employer]  [phone]    Agencies that provide inexpensive medical care: Organization         Address  Phone   Notes  [place]  [phone]   [facility]    (  [phone]   [facility] [place], [place] [address] [phone]   [place] [address] [place] [phone]   Planned Parenthood    [phone]   [facility]    [phone]   [place] and [address], [place] Phone:  [phone], Fax:  [phone] Hours of Operation:  9 am - 6 pm, M-F.  Also accepts Medicaid/Medicare and self-pay.  [facility] for [place], [address], [place] Phone: [phone], Fax: [phone]. Hours of Operation:  8:30 am - 5:30 pm, M-F.  Also accepts Medicaid and self-pay.  [facility] [place] [address], [place] Phone: [phone]   [place], [place], [place] [phone], Ext. [phone] Mondays & Thursdays: 7-9 AM.  First 15 patients are seen on a first come, first serve basis.    [place] Providers:  Organization         Address  Phone   Notes  [facility] [address], [place] [phone] Also accepts self-pay patients.  [facility] [id number] [place], [place]  [phone]   [place], [address], [place] [phone]   [facility] [address], [place] [phone]   Lucianne Lei [address], [place]   [phone] Only accepts [place] Access [place] patients after they have their name applied to their card.   Self-Pay (no insurance) in [facility]:  Organization         Address  Phone   Notes  Sickle Cell Patients, [facility] Magazine [phone]   [facility] Urgent Care [place] [phone]   [facility] [place]  [place], [place], [place] [phone]   [facility]/Dr. Osei-Bonsu  [address] [place] or [address], [place] [phone] Phone number for both [place] and [place] locations is the same.  Urgent Medical and [facility] [address], [place] [phone]   [facility] [address] [place] or [address] [phone]   [facility] [address], [place] [phone], phone; [phone], fax Sees patients 1st and 3rd Saturday of every month.  Must not qualify for public or private insurance (i.e.  Medicaid, Medicare, [place] Health Choice, Veterans' Benefits)  Household income should be no more than 200% of the poverty level The clinic cannot treat you if you are pregnant or think you are pregnant  Sexually transmitted diseases are not treated at the clinic.    Dental Care: Organization         Address  Phone  Notes  [facility] Department of [facility] [place] [phone] Accepts children up to age 54 who are enrolled in [place] or [place]; pregnant women with a Medicaid card; and children who have applied for Medicaid or  Health Choice, but were declined, whose parents can pay a reduced fee at time of service.  [facility] Department of [facility]  [address], [place] [phone] Accepts children up  to age 84 who are enrolled in Medicaid or [place] Health Choice; pregnant women with a Medicaid card; and children who have applied for Medicaid or [place] Health Choice, but were declined, whose parents can pay a reduced fee at time of service.  [place] Adult [facility] Access PROGRAM  Fellows [phone] Patients are seen by appointment only. Walk-ins are not accepted. [place] will see patients 9 years of age and older. Monday - Tuesday (8am-5pm) Most Wednesdays (8:30-5pm) $30 per visit, cash only  [facility] Adult [facility] Access PROGRAM  [address], [facility] [phone] Patients are seen by appointment only. Walk-ins are not accepted. [place] will see patients 65 years of age and older. One Wednesday Evening (Monthly: Volunteer Based).  $30 per visit, cash only  [place]  [phone] for adults; Children under age 30, call Graduate Pediatric Dentistry at [phone]. Children aged 55-14, please call [phone] to request a pediatric application.  Dental services are provided in all areas of dental care including fillings,  crowns and bridges, complete and partial dentures, implants, gum treatment, root canals, and extractions. Preventive care is also provided. Treatment is provided to both adults and children. Patients are selected via a lottery and there is often a waiting list.   [facility] [address] [place]  [phone] [email or web address]   [facility] [address] [place], [place] [phone], Ext. [phone] Second and Fourth Thursday of each month, opens at 6:30 AM; Clinic ends at 9 AM.  Patients are seen on a first-come first-served basis, and a limited number are seen during each clinic.   [facility]  [address], [place] [phone]   Eligibility Requirements You must have lived in [place], [place], or [place] counties for at least the last three months.   You cannot be eligible for state or federal sponsored [employer], including [employer], [place], or [employer].   You generally cannot be eligible for healthcare insurance through your employer.    How to apply: Eligibility screenings are held every Tuesday and Wednesday afternoon from 1:00 pm until 4:00 pm. You do not need an appointment for the interview!  [facility] [address] [place], [place]   [place]  Brecon Department  [place]  [phone]    Behavioral Health Resources in the Community: Intensive Outpatient Programs Organization         Address  Phone  Notes  [facility] [address] [place], [place] [phone]   [facility] Outpatient [address], [place], [place]   ADS: Alcohol & Drug S[facility]cs [address], [place], [place]    [place] [address]  [place], [place] or [phone]   Substance Abuse  Resources Organization         Address  Phone  Notes  Alcohol and Drug Services  [phone]   [place]  [phone]   [facility] [place]   Chinita Pester  [phone]   Residential & Outpatient Substance Abuse Program  [phone]   Psychological Services Organization         Address  Phone  Notes  [place]  [place]  [place]   [place]  [address] [place] or [phone]    Mobile Crisis Teams Organization         Address  Phone  Notes  [facility], Mobile Crisis Care Unit  [phone]   [facility]  [address] [place], [place]   [place] [address] [place] [phone]    Self-Help/Support Groups Organization         Address  Phone             Notes  [place]. of [place] - variety of support groups  [place] Call for more information  Narcotics Anonymous (NA), [facility] [address], [employer] [place]  2 meetings at this location   Special educational needs teacher         Address  Phone  Notes  [facility]idential Treatment [place],    [place]  [phone]   [facility]  [address] [place] [phone], [place], [place]   [place], [place] [phone] Admissions: 8am-3pm M-F  Incentives Substance [place] [address],    [place], [place] [phone]   [facility] [address] [place], [place], [place]   [facility] [address],  [place], [place]   [facility] - Intensive Outpatient [address], [place], [place] Walk   [facility] ([place].) [address] [place].,  [place], [place] [phone] or [phone]   [facility] (RTS) [address] [place], [place] Accepts Medicaid  [facility] [place] [address],  [place] [phone] Substance Abuse/Addiction Treatment   [facility] Organization         Address  Phone  Notes  [facility]  [phone]   Domenic Schwab, PhD [address] [place], [place]   [phone] or [phone]   [facility] [place], [place] [phone]   [facility] [address] [place], [place] [phone] Insurance/Medicaid/sponsorship through [facility] and Families [address]                                    [place], [place] [phone] [place] [address], [place] [phone]    Dr. Adele Schilder  [phone]   [facility] Dept. 1) [address], [place] 2) [place] 3)  [place] [address], [place] [phone]  [phone]   [place] [phone] or [phone] (After Hours)

## 2015-03-13 NOTE — ED Provider Notes (Signed)
CSN: [id number]     Arrival date & time 03/13/15  1918 History   First MD Initiated Contact with Patient 03/13/15 2052     Chief Complaint  Patient presents with  . Shortness of Breath  . Agitation   (Consider location/radiation/quality/duration/timing/severity/associated sxs/prior Treatment) HPI  Cathy [name] is a 34 year old female with a history of asthma, and bipolar, presenting with report of rib pain and shortness of breath. She states she's had right sided posterior rib pain times several months, however today she had gradual onset of left-sided posterior rib pain. She states the pain progressively worsened until it began to make her short of breath and tearful.  She has noticed yellow sputum in the morning when coughing and she reports smoking a pack per day. She has not measured her temperature but endorses hot flashes and cold chills.  She denies any unilateral leg swelling, recent trauma or travel, history of VTE/PE, hemoptysis or exogenous estrogen.    Past Medical History  Diagnosis Date  . Depression   . Bipolar depression   . Asthma   . Trichomonas infection   . Gonorrhea   . Pelvic inflammatory disease (PID)    Past Surgical History  Procedure Laterality Date  . No past surgeries     Family History  Problem Relation Age of Onset  . Diabetes Maternal Grandmother   . Hypertension Maternal Grandmother   . Diabetes Maternal Grandfather   . Hypertension Maternal Grandfather   . Diabetes Paternal Grandmother   . Hypertension Paternal Grandmother   . Diabetes Paternal Grandfather   . Hypertension Paternal Grandfather    History  Substance Use Topics  . Smoking status: Current Some Day Smoker -- 1.00 packs/day    Types: Cigarettes  . Smokeless tobacco: Never Used  . Alcohol Use: No   OB History    Gravida Para Term Preterm AB TAB SAB Ectopic Multiple Living   5 4 4  0 1 0 1 0 0 4     Review of Systems  Constitutional: Negative for fever and chills.  HENT:  Negative for sore throat.   Eyes: Negative for visual disturbance.  Respiratory: Positive for cough, chest tightness and shortness of breath.   Cardiovascular: Positive for chest pain. Negative for leg swelling.  Gastrointestinal: Negative for nausea, vomiting and diarrhea.  Genitourinary: Negative for dysuria.  Musculoskeletal: Positive for myalgias.  Skin: Negative for rash.  Neurological: Negative for weakness, numbness and headaches.      Allergies  Review of patient's allergies indicates no known allergies.  Home Medications   Prior to Admission medications   Medication Sig Start Date End Date Taking? Authorizing Provider  albuterol (PROVENTIL HFA;VENTOLIN HFA) 108 (90 BASE) MCG/ACT inhaler Inhale 2 puffs into the lungs every 4 (four) hours as needed for wheezing or shortness of breath. 05/01/14   Orpah Greek, MD  albuterol (PROVENTIL) (2.5 MG/3ML) 0.083% nebulizer solution Take 3 mLs (2.5 mg total) by nebulization every 4 (four) hours as needed for wheezing or shortness of breath. 05/01/14   Orpah Greek, MD  clotrimazole (LOTRIMIN) 1 % cream Apply 1 application topically 2 (two) times daily as needed (itching).    Historical Provider, MD  famotidine (PEPCID) 20 MG tablet Take 1 tablet (20 mg total) by mouth 2 (two) times daily as needed (itching). 05/27/14   Nicole Pisciotta, PA-C  hydrocortisone cream 0.5 % Apply 1 application topically 2 (two) times daily as needed for itching.    Historical Provider, MD  hydrOXYzine (  ATARAX/VISTARIL) 25 MG tablet Take 1 tablet (25 mg total) by mouth every 4 (four) hours as needed for itching. 05/27/14   Nicole Pisciotta, PA-C  ibuprofen (ADVIL,MOTRIN) 200 MG tablet Take 800 mg by mouth every 6 (six) hours as needed for headache.     Historical Provider, MD  Multiple Vitamin (MULTIVITAMIN WITH MINERALS) TABS tablet Take 2 tablets by mouth daily.    Historical Provider, MD   BP 99/77 mmHg  Pulse 118  Resp 32  SpO2 100%  LMP  03/06/2015 Physical Exam  Constitutional: She appears well-developed and well-nourished. No distress.  HENT:  Head: Normocephalic and atraumatic.  Mouth/Throat: Oropharynx is clear and moist. No oropharyngeal exudate.  Eyes: Conjunctivae are normal.  Neck: Neck supple. No thyromegaly present.  Cardiovascular: Normal rate, regular rhythm and intact distal pulses.   Pulmonary/Chest: Effort normal. No respiratory distress. She has no decreased breath sounds. She has wheezes in the right middle field and the left middle field. She has no rhonchi. She has no rales.   She exhibits tenderness.    Abdominal: Soft. There is no tenderness.  Musculoskeletal: She exhibits no tenderness.  Lymphadenopathy:    She has no cervical adenopathy.  Neurological: She is alert.  Skin: Skin is warm and dry. No rash noted. She is not diaphoretic.  Psychiatric: She has a normal mood and affect.  Nursing note and vitals reviewed.   ED Course  Procedures (including critical care time) Labs Review Labs Reviewed  COMPREHENSIVE METABOLIC PANEL - Abnormal; Notable for the following:    Total Protein 8.4 (*)    GFR calc non Af Amer 75 (*)    GFR calc Af Amer 87 (*)    All other components within normal limits  CBC WITH DIFFERENTIAL/PLATELET  PREGNANCY, URINE    Imaging Review Dg Chest 2 View  03/13/2015   CLINICAL DATA:  Shortness of breath.  Left posterior rib cage pain.  EXAM: CHEST  2 VIEW  COMPARISON:  05/01/2014  FINDINGS: The heart size and mediastinal contours are within normal limits. Both lungs are clear. The visualized skeletal structures are unremarkable.  IMPRESSION: No active cardiopulmonary disease.   Electronically Signed   By: Rolm Baptise M.D.   On: 03/13/2015 21:19     EKG Interpretation   Date/Time:  Tuesday March 13 2015 20:58:22 EDT Ventricular Rate:  74 PR Interval:  148 QRS Duration: 66 QT Interval:  385 QTC Calculation: 427 R Axis:   69 Text Interpretation:  Sinus rhythm  Probable left atrial enlargement  Borderline repolarization abnormality Baseline wander in lead(s) III Sinus  rhythm Artifact Borderline ECG Confirmed by Carmin Muskrat  MD ([id number]) on  03/13/2015 9:09:26 PM      MDM   Final diagnoses:  Costochondritis   34 yo with reproducible chest wall pain.  Pain has been intermittent for several months, worse with coughing after smoking and worse tonight after emotionally stressful situation.  She has no perc criteria. And her EKG and CXR is negative. She has some mild wheezes consistent with hx of smoking and shortness of breath resolved after neb tx. Her muscular tenderness improved after treatment. Current bipolar medication regimen discussed and pt reports understanding of dosage of frequency of meds and reports understanding of adherence to current medications.  Pt is well-appearing, in no acute distress and vital signs reviewed and not concerning. She appears safe to be discharged.  Discharge include resources to follow-up with a PCP.  Return precautions provided. Pt aware of plan and  in agreement.   Filed Vitals:   03/13/15 2001 03/13/15 2128 03/13/15 2215 03/13/15 2259  BP: 99/77 130/91 113/94 142/89  Pulse: 118 63 77 59  Temp:  98.4 F (36.9 C)    TempSrc: Oral Oral    Resp: 32 18 13 17   SpO2: 100% 100% 100% 100%   Meds given in ED:  Medications  sodium chloride 0.9 % bolus 1,000 mL (0 mLs Intravenous Stopped 03/13/15 2307)  ketorolac (TORADOL) 30 MG/ML injection 30 mg (30 mg Intravenous Given 03/13/15 2138)  albuterol (PROVENTIL) (2.5 MG/3ML) 0.083% nebulizer solution 5 mg (5 mg Nebulization Given 03/13/15 2145)  ipratropium-albuterol (DUONEB) 0.5-2.5 (3) MG/3ML nebulizer solution 3 mL (3 mLs Nebulization Given 03/13/15 2145)    Discharge Medication List as of 03/13/2015 10:56 PM    START taking these medications   Details  !! albuterol (PROVENTIL HFA;VENTOLIN HFA) 108 (90 BASE) MCG/ACT inhaler Inhale 2 puffs into the lungs every 4  (four) hours as needed for wheezing or shortness of breath., Starting 03/13/2015, Until Discontinued, Print    naproxen (NAPROSYN) 500 MG tablet Take 1 tablet (500 mg total) by mouth 2 (two) times daily., Starting 03/13/2015, Until Discontinued, Print     !! - Potential duplicate medications found. Please discuss with provider.         Britt Bottom, NP 03/15/15 [id number]  Carmin Muskrat, MD 03/15/15 [phone]

## 2015-03-13 NOTE — ED Notes (Signed)
Called for pt in lobby x 2, no answer.

## 2015-04-18 ENCOUNTER — Encounter (HOSPITAL_COMMUNITY): Payer: Self-pay | Admitting: *Deleted

## 2015-04-18 ENCOUNTER — Emergency Department (HOSPITAL_COMMUNITY)
Admission: EM | Admit: 2015-04-18 | Discharge: 2015-04-18 | Disposition: A | Discharge status: Home / Self Care | Payer: Medicaid Other | Attending: Emergency Medicine | Admitting: Emergency Medicine

## 2015-04-18 DIAGNOSIS — Z8742 Personal history of other diseases of the female genital tract: Secondary | ICD-10-CM | POA: Insufficient documentation

## 2015-04-18 DIAGNOSIS — Z8619 Personal history of other infectious and parasitic diseases: Secondary | ICD-10-CM | POA: Insufficient documentation

## 2015-04-18 DIAGNOSIS — J45909 Unspecified asthma, uncomplicated: Secondary | ICD-10-CM | POA: Diagnosis not present

## 2015-04-18 DIAGNOSIS — K029 Dental caries, unspecified: Secondary | ICD-10-CM | POA: Diagnosis not present

## 2015-04-18 DIAGNOSIS — Z79899 Other long term (current) drug therapy: Secondary | ICD-10-CM | POA: Diagnosis not present

## 2015-04-18 DIAGNOSIS — Z720 Tobacco use: Secondary | ICD-10-CM | POA: Insufficient documentation

## 2015-04-18 DIAGNOSIS — Z8659 Personal history of other mental and behavioral disorders: Secondary | ICD-10-CM | POA: Insufficient documentation

## 2015-04-18 DIAGNOSIS — K121 Other forms of stomatitis: Secondary | ICD-10-CM | POA: Diagnosis not present

## 2015-04-18 DIAGNOSIS — Z91040 Latex allergy status: Secondary | ICD-10-CM | POA: Diagnosis not present

## 2015-04-18 DIAGNOSIS — J029 Acute pharyngitis, unspecified: Secondary | ICD-10-CM | POA: Diagnosis not present

## 2015-04-18 DIAGNOSIS — K047 Periapical abscess without sinus: Secondary | ICD-10-CM

## 2015-04-18 DIAGNOSIS — R220 Localized swelling, mass and lump, head: Secondary | ICD-10-CM | POA: Diagnosis present

## 2015-04-18 MED ORDER — HYDROCODONE-ACETAMINOPHEN 5-325 MG PO TABS: 1.0000 | ORAL_TABLET | Freq: Four times a day (QID) | ORAL | Status: DC | PRN

## 2015-04-18 MED ORDER — AMOXICILLIN 500 MG PO CAPS: 500.0000 mg | ORAL_CAPSULE | Freq: Three times a day (TID) | ORAL | Status: DC

## 2015-04-18 MED ORDER — NAPROXEN 500 MG PO TABS: 500.0000 mg | ORAL_TABLET | Freq: Two times a day (BID) | ORAL | Status: DC

## 2015-04-18 NOTE — ED Provider Notes (Signed)
The patient is a 34 year old female, she has had dental pain on the right lower rear molar for several days, on exam she has severe degradation of the right rear molar, there is mild tenderness surrounding this area, no gingival abscess, no jaw asymmetry, no trismus or torticollis. When the patient lifts up her tongue she has a large vesicular lesion on the right side of the sublingual area, this is clearly vesicular, there is no pustular nature to this, there is no induration, she has normal tongue protrusion, no tenderness, lymphadenopathy or change in the skin underneath the sublingual, submental or submandibular area, no torticollis, very supple neck without lymphadenopathy, normal phonation. The patient will need to follow-up with dental, she will need antibiotics and pain medication for her tooth, I do not think that this vesicular lesion represents a Ludwig angina, she can follow up outpatient or return if symptoms worsen, she has been given the return precautions and has expressed her understanding.  Medical screening examination/treatment/procedure(s) were conducted as a shared visit with non-physician practitioner(s) and myself.  I personally evaluated the patient during the encounter.  Clinical Impression:   Final diagnoses:  Infected dental caries  Oral ulceration         Noemi Chapel, MD 04/19/15 1001

## 2015-04-18 NOTE — ED Provider Notes (Signed)
CSN: [id number]     Arrival date & time 04/18/15  2114 History  This chart was scribed for Debroah Baller, NP working with No att. providers found by Mercy Moore, ED Scribe. This patient was seen in room TR07C/TR07C and the patient's care was started at 9:46 PM.   Chief Complaint  Patient presents with  . Oral Swelling    The history is provided by the patient. No language interpreter was used.   HPI Comments: Cathy [name] is a 34 y.o. female who presents to the Emergency Department complaining of sublingual blister that represented a few days ago. Associated symptoms include right sore throat and swelling and tingling in he right ear. Patient reports similar blister one month ago that resolved with salt water gargling; her current blister is larger in size however. Patient suspected she has an abscess from an infected tooth that has "always given her problems." Patient denies additional complaints.   Past Medical History  Diagnosis Date  . Depression   . Bipolar depression   . Asthma   . Trichomonas infection   . Gonorrhea   . Pelvic inflammatory disease (PID)    Past Surgical History  Procedure Laterality Date  . No past surgeries     Family History  Problem Relation Age of Onset  . Diabetes Maternal Grandmother   . Hypertension Maternal Grandmother   . Diabetes Maternal Grandfather   . Hypertension Maternal Grandfather   . Diabetes Paternal Grandmother   . Hypertension Paternal Grandmother   . Diabetes Paternal Grandfather   . Hypertension Paternal Grandfather    History  Substance Use Topics  . Smoking status: Current Some Day Smoker -- 1.00 packs/day    Types: Cigarettes  . Smokeless tobacco: Never Used  . Alcohol Use: Yes   OB History    Gravida Para Term Preterm AB TAB SAB Ectopic Multiple Living   5 4 4  0 1 0 1 0 0 4     Review of Systems  Constitutional: Negative for fever and chills.  HENT: Positive for dental problem, mouth sores and sore throat.  Negative for congestion and rhinorrhea.   Eyes: Negative for visual disturbance.  Respiratory: Negative for cough, shortness of breath and wheezing.   Gastrointestinal: Negative for nausea, vomiting, abdominal pain and diarrhea.  Endocrine: Negative for polyuria.  Genitourinary: Negative for dysuria and hematuria.  Musculoskeletal: Negative for back pain and joint swelling.  Skin: Negative for rash.  Allergic/Immunologic: Negative for immunocompromised state.  Neurological: Negative for headaches.  Hematological: Does not bruise/bleed easily.  Psychiatric/Behavioral: Negative for confusion.      Allergies  Latex  Home Medications   Prior to Admission medications   Medication Sig Start Date End Date Taking? Authorizing Provider  albuterol (PROVENTIL HFA;VENTOLIN HFA) 108 (90 BASE) MCG/ACT inhaler Inhale 2 puffs into the lungs every 4 (four) hours as needed for wheezing or shortness of breath. 05/01/14   Orpah Greek, MD  albuterol (PROVENTIL HFA;VENTOLIN HFA) 108 (90 BASE) MCG/ACT inhaler Inhale 2 puffs into the lungs every 4 (four) hours as needed for wheezing or shortness of breath. 03/13/15   Britt Bottom, NP  albuterol (PROVENTIL) (2.5 MG/3ML) 0.083% nebulizer solution Take 3 mLs (2.5 mg total) by nebulization every 4 (four) hours as needed for wheezing or shortness of breath. 05/01/14   Orpah Greek, MD  amoxicillin (AMOXIL) 500 MG capsule Take 1 capsule (500 mg total) by mouth 3 (three) times daily. 04/18/15   Pansey Pinheiro Bunnie Pion, NP  famotidine (  PEPCID) 20 MG tablet Take 1 tablet (20 mg total) by mouth 2 (two) times daily as needed (itching). 05/27/14   Nicole Pisciotta, PA-C  HYDROcodone-acetaminophen (NORCO) 5-325 MG per tablet Take 1 tablet by mouth every 6 (six) hours as needed for moderate pain. 04/18/15   Ryden Wainer Bunnie Pion, NP  hydrOXYzine (ATARAX/VISTARIL) 25 MG tablet Take 1 tablet (25 mg total) by mouth every 4 (four) hours as needed for itching. 05/27/14   Nicole  Pisciotta, PA-C  ibuprofen (ADVIL,MOTRIN) 200 MG tablet Take 600 mg by mouth every 6 (six) hours as needed for headache or moderate pain.     Historical Provider, MD  naproxen (NAPROSYN) 500 MG tablet Take 1 tablet (500 mg total) by mouth 2 (two) times daily. 04/18/15   [place], NP   Triage Vitals: BP 138/78 mmHg  Pulse 60  Temp(Src) 98.4 F (36.9 C) (Oral)  Resp 18  Ht 5\' 7"  (1.702 m)  Wt 125 lb (56.7 kg)  BMI 19.57 kg/m2  SpO2 100%  LMP 03/04/2015 Physical Exam  Constitutional: She is oriented to person, place, and time. She appears well-developed and well-nourished.  HENT:  Head: Normocephalic.  Right Ear: Tympanic membrane normal.  Left Ear: Tympanic membrane normal.  Nose: Nose normal.  Mouth/Throat: Uvula is midline. No oropharyngeal exudate, posterior oropharyngeal edema or posterior oropharyngeal erythema.    Large vesicular lesion right sublingual area, no pustular nature, no gingival abscess, normal tongue protrusion, no change in skin subungual, submental or submandibular. No difficulty swallowing.    Eyes: Conjunctivae and EOM are normal.  Neck: Normal range of motion. Neck supple.  Pulmonary/Chest: Effort normal.  Abdominal: Soft. There is no tenderness.  Musculoskeletal: Normal range of motion.  Lymphadenopathy:    She has no cervical adenopathy.  Neurological: She is alert and oriented to person, place, and time. No cranial nerve deficit.  Skin: Skin is warm and dry.  Psychiatric: She has a normal mood and affect. Her behavior is normal.  Nursing note and vitals reviewed.   ED Course  Procedures (including critical care time)  COORDINATION OF CARE: 9:55 PM- Discussed treatment plan with patient at bedside and patient agreed to plan.    MDM  34 y.o. female with dental caries and infection and large vesicular lesion stable for d/c without difficulty swallowing or airway compromise. No trismus. Will treat with antibiotics and pain medication she will  follow up with a dentist as soon as possible. List of dentist given from resource guide.    Final diagnoses:  Infected dental caries  Oral ulceration   I personally performed the services described in this documentation, which was scribed in my presence. The recorded information has been reviewed and is accurate.    [address] [place], [place] 04/18/15 [place], MD 04/19/15 1000

## 2015-04-18 NOTE — Discharge Instructions (Signed)
°Emergency Department Resource Guide °1) Find a Doctor and Pay Out of Pocket °Although you won't have to find out who is covered by your insurance plan, it is a good idea to ask around and get recommendations. You will then need to call the office and see if the doctor you have chosen will accept you as a new patient and what types of options they offer for patients who are self-pay. Some doctors offer discounts or will set up payment plans for their patients who do not have insurance, but you will need to ask so you aren't surprised when you get to your appointment. ° °2) Contact Your Local Health Department °Not all health departments have doctors that can see patients for sick visits, but many do, so it is worth a call to see if yours does. If you don't know where your local health department is, you can check in your phone book. The CDC also has a tool to help you locate your state's health department, and many state websites also have listings of all of their local health departments. ° °3) Find a Walk-in Clinic °If your illness is not likely to be very severe or complicated, you may want to try a walk in clinic. These are popping up all over the country in pharmacies, drugstores, and shopping centers. They're usually staffed by nurse practitioners or physician assistants that have been trained to treat common illnesses and complaints. They're usually fairly quick and inexpensive. However, if you have serious medical issues or chronic medical problems, these are probably not your best option. ° °No Primary Care Doctor: °- Call [facility] at  832-8000 - they can help you locate a primary care doctor that  accepts your insurance, provides certain services, etc. °- Physician Referral Service- 1-800-533-3463 ° °Chronic Pain Problems: °Organization         Address  Phone   Notes  °[facility]  (336) 297-2271 Patients need to be referred by their primary care doctor.  ° °Medication  Assistance: °Organization         Address  Phone   Notes  °[place] [facility] [address] °[place], [place] [address] (336) 641-8030 --Must be a resident of [place] °-- Must have NO insurance coverage whatsoever (no Medicaid/ Medicare, etc.) °-- The pt. MUST have a primary care doctor that directs their care regularly and follows them in the community °  °[facility]  (866) 331-1348   °[employer]  (888) 892-1162   ° °Agencies that provide inexpensive medical care: °Organization         Address  Phone   Notes  °[facility]  (336) 832-8035   °[facility]    (336) 832-7272   °[facility] [address] °[place], [place] [address] (336) 832-4777   °[facility] [address], °[place] (336) 271-4999   °[facility]    (336) 373-0678   °[facility]    (336) 272-1050   °[facility] ° [address],  Phone:  (336) 832-4444, Fax:  (336) 832-4440 Hours of Operation:  9 am - 6 pm, M-F.  Also accepts Medicaid/Medicare and self-pay.  °[facility] ° [address],  Phone: (336) 832-3150, Fax: (336) 832-3151. Hours of Operation:  8:30 am - 5:30 pm, M-F.  Also accepts Medicaid and self-pay.  °[facility] [place] [address], [place] Phone: (336) 878-6027   °[facility] [address], [place], [place] (336)723-1848, Ext. 123 Mondays & Thursdays: 7-9 AM.  First 15 patients are seen on a first come, first serve basis. °  ° °Medicaid-accepting [place] Providers: ° °Organization         Address  Phone   Notes  °[facility] [address], [place] (336) 641-2100 Also accepts self-pay patients.  °[facility] [address], [place] ° (336) 856-9996   °[facility] [address], [place]  (336) 288-8857   °[facility] [address] [place] [address], [place] (336) 299-7000   °Veita Bland [address], [place]  ° (336) 373-1557 Only accepts [place] Access Medicaid patients after they have their name applied to their card.  ° °Self-Pay (no insurance) in [place]: ° °Organization         Address  Phone   Notes  °Sickle Cell Patients, [facility] [address], [place] (336) 832-1970   °[facility] [address], [place] (336) 832-4400   °[facility] ° [address], [place] (336) 992-4800   °[facility]/Dr. Osei-Bonsu ° [address] [place] [address], [place] or [address], [place] (336) 841-8500 Phone number for both [place] and [place] locations is the same.  °[facility] [address], [place] (336) 299-0000   °[facility] [address] [place] [address], [place] or [address] (336) 852-7530 °(336) 878-2260   °[facility] [address], [place] (336) 350-1642, phone; (336) 294-5005, fax Sees patients 1st and 3rd Saturday of every month.  Must not qualify for public or private insurance (i.e. Medicaid, Medicare, [place] Health Choice, Veterans' Benefits) • Household income should be no more than 200% of the poverty level •The clinic cannot treat you if you are pregnant or think you are pregnant • Sexually transmitted diseases are not treated at the clinic.  ° ° °Dental Care: °Organization         Address  Phone  Notes  °[place] [facility] [address], [place] (336) 641-6152 Accepts children up to age 21 who are enrolled in Medicaid or [place] Health Choice; pregnant women with a Medicaid card; and children who have applied for Medicaid or [place] Health Choice, but were declined, whose parents can pay a reduced fee at time of service.  °[place]  [facility] [place]  [address], [place] (336) 641-7733 Accepts children up to age 21 who are enrolled in Medicaid or [place] Health Choice; pregnant women with a Medicaid card; and children who have applied for Medicaid or [place] Health Choice, but were declined, whose parents can pay a reduced fee at time of service.  °[facility] ° [address], [place] (336) 641-4533 Patients are seen by appointment only. Walk-ins are not accepted. [facility] will see patients 18 years of age and older. °Monday - Tuesday (8am-5pm) °Most Wednesdays (8:30-5pm) °$30 per visit, cash only  °[facility] ° [address], [place] (336) 641-4533 Patients are seen by appointment only. Walk-ins are not accepted. [facility] will see patients 18 years of age and older. °One   Wednesday Evening (Monthly: Volunteer Based).  $30 per visit, cash only  °[facility]  (919) 537-3737 for adults; Children under age 4, call Graduate Pediatric Dentistry at ([phone]) [phone]. Children aged 4-14, please call (919) [phone] to request a pediatric application. ° Dental services are provided in all areas of dental care including fillings, crowns and bridges, complete and partial dentures, implants, gum treatment, root canals, and extractions. Preventive care is also provided. Treatment is provided to both adults and children. °Patients are selected via a lottery and there is often a waiting list. °  °[facility] [address], °[place] ° (336) 763-8833 [email or web address] °  °[facility] [address], [place], [place] (336)723-1848, Ext. 123 Second and Fourth Thursday of each month, opens at 6:30 AM; Clinic ends at 9 AM.  Patients are seen on a first-come first-served basis, and a limited number are seen during each clinic.  ° °[facility] ° [address], [place], [place] (336) 723-7904    Eligibility Requirements °You must have lived in [place], [place], or [place] counties for at least the last three months. °  You cannot be eligible for state or federal sponsored healthcare insurance, including Veterans Administration, Medicaid, or Medicare. °  You generally cannot be eligible for healthcare insurance through your employer.  °  How to apply: °Eligibility screenings are held every Tuesday and Wednesday afternoon from 1:00 pm until 4:00 pm. You do not need an appointment for the interview!  °[facility] [address], [place], [place] 336-631-2330   °[facility]  336-342-8273   °[facility]  336-703-3100   °[facility]  336-570-6415   ° °Behavioral Health Resources in the Community: °Intensive Outpatient Programs °Organization         Address  Phone  Notes  °[facility] [address], [place], [place] 336-878-6098   °[facility] [address], [place], [place] 336-832-9800   °[facility] [address], [place], [place] ° 336-882-2125   °[facility] [address],  °[place], [place] 1-800-853-5163 or 336-641-4981   °Substance Abuse Resources °Organization         Address  Phone  Notes  °[facility]  336-882-2125   °[facility]  336-784-9470   °[facility]  336-285-9073   °[facility]  336-845-3988   °Residential & Outpatient Substance Abuse Program  1-800-659-3381   °Psychological Services °Organization         Address  Phone  Notes  °[facility]  336- 832-9600   °[facility]  336- 378-7881   °[facility] [address], [place] 1-800-853-5163 or 336-641-4981   ° °Mobile Crisis Teams °Organization         Address  Phone  Notes  °[facility], Mobile Crisis Care Unit  1-877-626-1772   °Assertive °[facility] ° [address]  [place], [place] 336-834-9664   °Sharon DeEsch [address] °[place] 336-554-5454   ° °Self-Help/Support Groups °Organization         Address  Phone             Notes  °[facility]  - variety of support groups  336- 373-1402 Call for more information  °Narcotics Anonymous (NA), [facility] [address], °[place]  2 meetings at this location  ° °  Residential Treatment Programs Organization         Address  Phone  Notes  [facility] [address],    [place]  1-719 089 2365   [facility]  [address], [place] [id number], [place], [place]   [place], [place] [phone] Admissions: 8am-3pm M-F  [facility] [place] [address]    [place], [place] [id number]   [facility] [address] [place], [place], [place]   [facility] [address],  [place], [place]   [facility] Intensive Outpatient [address], [place], [place]   [facility] ([place] [address]  [place], [place] 1-508 436 2566 or 9282354002   [facility] ([facility] [address] [place], [place] Accepts Medicaid  [facility] [address]  [place] 1-6825699256 Substance Abuse/Addiction Treatment   [facility] Organization         Address  Phone  Notes  [facility]  845-088-6551   Domenic Schwab, PhD [address] [place], [place]   762-001-8948 or (779)776-6447   [facility] [place], [place] (657)649-5741   [facility] [address], [place], [place] 650-044-2470 Insurance/Medicaid/sponsorship through [facility] [address] [place]                                    [place], [place] 301-074-3316 [place] [address], [place] [phone]    Dr. Adele Schilder  [phone]   [facility] Dept. 1) [address], [place] 2) [place] 3)  [address], [place] (618)[phone]  (332)[phone]   [place] (815)588-8135 or (442) [phone] (After Hours)       Dental Caries Dental caries (also called tooth decay) is the most common oral disease. It can occur at any age but is more common in children and young adults.  HOW DENTAL CARIES DEVELOPS  The process of decay begins when bacteria and foods (particularly sugars and starches) combine in your mouth to produce plaque. Plaque is a substance that sticks to the hard, outer surface of a tooth (enamel). The bacteria in plaque produce acids that attack enamel. These acids may also attack the root surface of a tooth (cementum) if it is exposed. Repeated attacks dissolve these surfaces and create holes in the tooth (cavities). If left untreated, the acids destroy the other layers of the tooth.  RISK FACTORS  Frequent sipping of sugary beverages.   Frequent snacking on sugary and starchy foods, especially those that easily get stuck in the teeth.   Poor oral hygiene.   Dry mouth.   Substance abuse such as methamphetamine abuse.   Broken or poor-fitting dental restorations.   Eating disorders.   Gastroesophageal reflux disease (GERD).   Certain radiation treatments to the head and neck. SYMPTOMS In the early stages of dental caries, symptoms are seldom present. Sometimes white, chalky areas may be seen on the enamel or other tooth layers. In later stages, symptoms may include:  Pits and holes on the enamel.  Toothache  after sweet, hot, or cold foods or drinks are consumed.  Pain around the tooth.  Swelling around the tooth. DIAGNOSIS  Most of the time, dental caries is detected  during a regular dental checkup. A diagnosis is made after a thorough medical and dental history is taken and the surfaces of your teeth are checked for signs of dental caries. Sometimes special instruments, such as lasers, are used to check for dental caries. Dental X-ray exams may be taken so that areas not visible to the eye (such as between the contact areas of the teeth) can be checked for cavities.  TREATMENT  If dental caries is in its early stages, it may be reversed with a fluoride treatment or an application of a remineralizing agent at the dental office. Thorough brushing and flossing at home is needed to aid these treatments. If it is in its later stages, treatment depends on the location and extent of tooth destruction:   If a small area of the tooth has been destroyed, the destroyed area will be removed and cavities will be filled with a material such as gold, silver amalgam, or composite resin.   If a large area of the tooth has been destroyed, the destroyed area will be removed and a cap (crown) will be fitted over the remaining tooth structure.   If the center part of the tooth (pulp) is affected, a procedure called a root canal will be needed before a filling or crown can be placed.   If most of the tooth has been destroyed, the tooth may need to be pulled (extracted). HOME CARE INSTRUCTIONS You can prevent, stop, or reverse dental caries at home by practicing good oral hygiene. Good oral hygiene includes:  Thoroughly cleaning your teeth at least twice a day with a toothbrush and dental floss.   Using a fluoride toothpaste. A fluoride mouth rinse may also be used if recommended by your dentist or health care provider.   Restricting the amount of sugary and starchy foods and sugary liquids you consume.   Avoiding frequent snacking on these foods and sipping of these liquids.   Keeping regular visits with a dentist for checkups and cleanings. PREVENTION   Practice good  oral hygiene.  Consider a dental sealant. A dental sealant is a coating material that is applied by your dentist to the pits and grooves of teeth. The sealant prevents food from being trapped in them. It may protect the teeth for several years.  Ask about fluoride supplements if you live in a community without fluorinated water or with water that has a low fluoride content. Use fluoride supplements as directed by your dentist or health care provider.  Allow fluoride varnish applications to teeth if directed by your dentist or health care provider. Document Released: 08/09/2002 Document Revised: 04/03/2014 Document Reviewed: 11/19/2012 [facility] Patient Information 2015 [place], [place]. This information is not intended to replace advice given to you by your health care provider. Make sure you discuss any questions you have with your health care provider.  Dental Pain A tooth ache may be caused by cavities (tooth decay). Cavities expose the nerve of the tooth to air and hot or cold temperatures. It may come from an infection or abscess (also called a boil or furuncle) around your tooth. It is also often caused by dental caries (tooth decay). This causes the pain you are having. DIAGNOSIS  Your caregiver can diagnose this problem by exam. TREATMENT   If caused by an infection, it may  be treated with medications which kill germs (antibiotics) and pain medications as prescribed by your caregiver. Take medications as directed.  Only take over-the-counter or prescription medicines for pain, discomfort, or fever as directed by your caregiver.  Whether the tooth ache today is caused by infection or dental disease, you should see your dentist as soon as possible for further care. SEEK MEDICAL CARE IF: The exam and treatment you received today has been provided on an emergency basis only. This is not a substitute for complete medical or dental care. If your problem worsens or new problems (symptoms)  appear, and you are unable to meet with your dentist, call or return to this location. SEEK IMMEDIATE MEDICAL CARE IF:   You have a fever.  You develop redness and swelling of your face, jaw, or neck.  You are unable to open your mouth.  You have severe pain uncontrolled by pain medicine. MAKE SURE YOU:   Understand these instructions.  Will watch your condition.  Will get help right away if you are not doing well or get worse. Document Released: 11/17/2005 Document Revised: 02/09/2012 Document Reviewed: 07/05/2008 [facility] Patient Information 2015 [place], [place]. This information is not intended to replace advice given to you by your health care provider. Make sure you discuss any questions you have with your health care provider.

## 2015-04-18 NOTE — ED Notes (Signed)
Patient presents with c/o right side of tongue swelling  Feels like something popped and then her tongue started swelling

## 2015-10-10 ENCOUNTER — Emergency Department (HOSPITAL_COMMUNITY)
Admission: EM | Admit: 2015-10-10 | Discharge: 2015-10-11 | Disposition: A | Discharge status: Home / Self Care | Payer: Medicaid Other | Attending: Emergency Medicine | Admitting: Emergency Medicine

## 2015-10-10 ENCOUNTER — Encounter (HOSPITAL_COMMUNITY): Payer: Self-pay | Admitting: Family Medicine

## 2015-10-10 DIAGNOSIS — Z720 Tobacco use: Secondary | ICD-10-CM | POA: Diagnosis not present

## 2015-10-10 DIAGNOSIS — F319 Bipolar disorder, unspecified: Secondary | ICD-10-CM | POA: Diagnosis not present

## 2015-10-10 DIAGNOSIS — T424X1A Poisoning by benzodiazepines, accidental (unintentional), initial encounter: Secondary | ICD-10-CM | POA: Insufficient documentation

## 2015-10-10 DIAGNOSIS — F329 Major depressive disorder, single episode, unspecified: Secondary | ICD-10-CM | POA: Diagnosis not present

## 2015-10-10 DIAGNOSIS — J45909 Unspecified asthma, uncomplicated: Secondary | ICD-10-CM | POA: Diagnosis not present

## 2015-10-10 DIAGNOSIS — Y9389 Activity, other specified: Secondary | ICD-10-CM | POA: Diagnosis not present

## 2015-10-10 DIAGNOSIS — Z791 Long term (current) use of non-steroidal anti-inflammatories (NSAID): Secondary | ICD-10-CM | POA: Diagnosis not present

## 2015-10-10 DIAGNOSIS — Z91040 Latex allergy status: Secondary | ICD-10-CM | POA: Diagnosis not present

## 2015-10-10 DIAGNOSIS — Z79899 Other long term (current) drug therapy: Secondary | ICD-10-CM | POA: Diagnosis not present

## 2015-10-10 DIAGNOSIS — Z8619 Personal history of other infectious and parasitic diseases: Secondary | ICD-10-CM | POA: Diagnosis not present

## 2015-10-10 DIAGNOSIS — F1210 Cannabis abuse, uncomplicated: Secondary | ICD-10-CM | POA: Insufficient documentation

## 2015-10-10 DIAGNOSIS — Z792 Long term (current) use of antibiotics: Secondary | ICD-10-CM | POA: Diagnosis not present

## 2015-10-10 DIAGNOSIS — T50901A Poisoning by unspecified drugs, medicaments and biological substances, accidental (unintentional), initial encounter: Secondary | ICD-10-CM

## 2015-10-10 DIAGNOSIS — Z8742 Personal history of other diseases of the female genital tract: Secondary | ICD-10-CM | POA: Insufficient documentation

## 2015-10-10 DIAGNOSIS — Z3202 Encounter for pregnancy test, result negative: Secondary | ICD-10-CM | POA: Diagnosis not present

## 2015-10-10 DIAGNOSIS — Y9289 Other specified places as the place of occurrence of the external cause: Secondary | ICD-10-CM | POA: Insufficient documentation

## 2015-10-10 DIAGNOSIS — Y998 Other external cause status: Secondary | ICD-10-CM | POA: Diagnosis not present

## 2015-10-10 LAB — CBC
HCT: 32.8 % — ABNORMAL LOW (ref 36.0–46.0)
Hemoglobin: 11.1 g/dL — ABNORMAL LOW (ref 12.0–15.0)
MCH: 29.2 pg (ref 26.0–34.0)
MCHC: 33.8 g/dL (ref 30.0–36.0)
MCV: 86.3 fL (ref 78.0–100.0)
PLATELETS: 165 10*3/uL (ref 150–400)
RBC: 3.8 MIL/uL — AB (ref 3.87–5.11)
RDW: 13.1 % (ref 11.5–15.5)
WBC: 5.4 10*3/uL (ref 4.0–10.5)

## 2015-10-10 LAB — COMPREHENSIVE METABOLIC PANEL
ALK PHOS: 55 U/L (ref 38–126)
ALT: 14 U/L (ref 14–54)
ANION GAP: 8 (ref 5–15)
AST: 18 U/L (ref 15–41)
Albumin: 3.5 g/dL (ref 3.5–5.0)
BUN: 9 mg/dL (ref 6–20)
CALCIUM: 8.9 mg/dL (ref 8.9–10.3)
CO2: 23 mmol/L (ref 22–32)
Chloride: 108 mmol/L (ref 101–111)
Creatinine, Ser: 0.82 mg/dL (ref 0.44–1.00)
Glucose, Bld: 102 mg/dL — ABNORMAL HIGH (ref 65–99)
Potassium: 3.4 mmol/L — ABNORMAL LOW (ref 3.5–5.1)
Sodium: 139 mmol/L (ref 135–145)
Total Bilirubin: 0.4 mg/dL (ref 0.3–1.2)
Total Protein: 7.1 g/dL (ref 6.5–8.1)

## 2015-10-10 LAB — RAPID URINE DRUG SCREEN, HOSP PERFORMED
Amphetamines: NOT DETECTED
Barbiturates: NOT DETECTED
Benzodiazepines: POSITIVE — AB
Cocaine: NOT DETECTED
Opiates: NOT DETECTED
Tetrahydrocannabinol: POSITIVE — AB

## 2015-10-10 LAB — ACETAMINOPHEN LEVEL

## 2015-10-10 LAB — ETHANOL: Alcohol, Ethyl (B): <5 mg/dL (ref <5)

## 2015-10-10 LAB — SALICYLATE LEVEL

## 2015-10-10 LAB — LITHIUM LEVEL

## 2015-10-10 LAB — VALPROIC ACID LEVEL: Valproic Acid Lvl: <10 ug/mL — ABNORMAL LOW (ref 50.0–100.0)

## 2015-10-10 MED ORDER — ALBUTEROL SULFATE HFA 108 (90 BASE) MCG/ACT IN AERS: 2.0000 | INHALATION_SPRAY | RESPIRATORY_TRACT | Status: DC | PRN

## 2015-10-10 MED ORDER — ONDANSETRON HCL 4 MG PO TABS: 4.0000 mg | ORAL_TABLET | Freq: Three times a day (TID) | ORAL | Status: DC | PRN

## 2015-10-10 MED ORDER — FAMOTIDINE 20 MG PO TABS: 20.0000 mg | ORAL_TABLET | Freq: Two times a day (BID) | ORAL | Status: DC | PRN

## 2015-10-10 MED ORDER — SODIUM CHLORIDE 0.9 % IV BOLUS (SEPSIS)
1000.0000 mL | Freq: Once | INTRAVENOUS | Status: AC
  Administered 2015-10-10: 1000 mL via INTRAVENOUS

## 2015-10-10 NOTE — ED Notes (Signed)
Called for sitter ?

## 2015-10-10 NOTE — Clinical Social Work Note (Signed)
CSW spoke with RN Levada Dy who stated that TTS consult was discontinued for now as patient is unable to communicate.  MD will put consult back in at [place], [place]

## 2015-10-10 NOTE — ED Notes (Signed)
Pt woke up to use the restroom.  Sitter released bed rail, pt began to stand unsteady on her feet sitting back down, and was unable to hold her urine.  Pt was not able to stop the urine stream and emptied her bladder on the floor.  Pt changed into paper scrubs, given new socks, and changed stretcher and all linens.

## 2015-10-10 NOTE — ED Notes (Signed)
Pt asked why she was here.  This RN advised pt that my understanding was she took too much xanax earlier today.  Pt stated, "Yes.  I remember doing that.  It's my bipolar medication."  This RN asked if she was trying to hurt herself.  Pt responded, "No, I was upset and kept taking them because I couldn't calm down."

## 2015-10-10 NOTE — Progress Notes (Signed)
Patient was referred at: [facility] - per Juliann Pulse, 1 female adult bed open, fax referral. [facility] - per intake, fax referral for review. [place] - left voicemail. [facility] - per intake, fax referral.  CSW will continue to seek placement.  Cathy [name], [place] Disposition staff 10/10/2015 10:31 PM

## 2015-10-10 NOTE — ED Notes (Addendum)
Spoke with Seleta Rhymes, [employer]. She suggests ordering lithium and valproic acid level with history of bipolar. Also, EKG, 1 liter of fluid, check tylenol level in 4 hours, observation for atleast 6 hours or until returns to baseline. MD made aware of suggestions. Pt took 15 xanax at noon today.

## 2015-10-10 NOTE — ED Notes (Signed)
Sitter at bedside.

## 2015-10-10 NOTE — ED Provider Notes (Signed)
CSN: [id number]     Arrival date & time 10/10/15  1317 History   First MD Initiated Contact with Patient 10/10/15 1342     Chief Complaint  Patient presents with  . Drug Overdose     (Consider location/radiation/quality/duration/timing/severity/associated sxs/prior Treatment) HPI   A LEVEL 5 CAVEAT PERTAINS DUE TO ALTERED MENTAL STATUS.  Pt presenting after taking 10-15 tablets of "my depression medication".  She states she is bipolar but does not know what medication she took.  Her boyfriend said she took the meds at approx noon.  He has called her mother who read out the name on the pill bottle as "alprazolam".  Pt states she took the medication to "help with my depression".  Unclear if this was a suicide attempt- pt unable to reliably answer this question at this time.    Past Medical History  Diagnosis Date  . Depression   . Bipolar depression ([place])   . Asthma   . Trichomonas infection   . Gonorrhea   . Pelvic inflammatory disease (PID)    Past Surgical History  Procedure Laterality Date  . No past surgeries     Family History  Problem Relation Age of Onset  . Diabetes Maternal Grandmother   . Hypertension Maternal Grandmother   . Diabetes Maternal Grandfather   . Hypertension Maternal Grandfather   . Diabetes Paternal Grandmother   . Hypertension Paternal Grandmother   . Diabetes Paternal Grandfather   . Hypertension Paternal Grandfather    Social History  Substance Use Topics  . Smoking status: Current Some Day Smoker -- 1.00 packs/day    Types: Cigarettes  . Smokeless tobacco: Never Used  . Alcohol Use: Yes   OB History    Gravida Para Term Preterm AB TAB SAB Ectopic Multiple Living   5 4 4  0 1 0 1 0 0 4     Review of Systems  UNABLE TO OBTAIN ROS DUE TO LEVEL 5 CAVEAT    Allergies  Latex  Home Medications   Prior to Admission medications   Medication Sig Start Date End Date Taking? Authorizing Provider  albuterol (PROVENTIL HFA;VENTOLIN HFA) 108  (90 BASE) MCG/ACT inhaler Inhale 2 puffs into the lungs every 4 (four) hours as needed for wheezing or shortness of breath. 05/01/14   Orpah Greek, MD  albuterol (PROVENTIL HFA;VENTOLIN HFA) 108 (90 BASE) MCG/ACT inhaler Inhale 2 puffs into the lungs every 4 (four) hours as needed for wheezing or shortness of breath. 03/13/15   Britt Bottom, NP  albuterol (PROVENTIL) (2.5 MG/3ML) 0.083% nebulizer solution Take 3 mLs (2.5 mg total) by nebulization every 4 (four) hours as needed for wheezing or shortness of breath. 05/01/14   Orpah Greek, MD  amoxicillin (AMOXIL) 500 MG capsule Take 1 capsule (500 mg total) by mouth 3 (three) times daily. 04/18/15   Hope Bunnie Pion, NP  famotidine (PEPCID) 20 MG tablet Take 1 tablet (20 mg total) by mouth 2 (two) times daily as needed (itching). 05/27/14   Nicole Pisciotta, PA-C  HYDROcodone-acetaminophen (NORCO) 5-325 MG per tablet Take 1 tablet by mouth every 6 (six) hours as needed for moderate pain. 04/18/15   Hope Bunnie Pion, NP  hydrOXYzine (ATARAX/VISTARIL) 25 MG tablet Take 1 tablet (25 mg total) by mouth every 4 (four) hours as needed for itching. 05/27/14   Nicole Pisciotta, PA-C  ibuprofen (ADVIL,MOTRIN) 200 MG tablet Take 600 mg by mouth every 6 (six) hours as needed for headache or moderate pain.  Historical Provider, MD  naproxen (NAPROSYN) 500 MG tablet Take 1 tablet (500 mg total) by mouth 2 (two) times daily. 04/18/15   Hope Bunnie Pion, NP   BP 107/74 mmHg  Pulse 69  Temp(Src) 98.5 F (36.9 C) (Oral)  Resp 22  SpO2 100%  Vitals reviewed Physical Exam  Physical Examination: General appearance - alert, drowsy appearing, and in no distress Mental status - alert, oriented to person, not to place or time Eyes - pupils equal and reactive, extraocular eye movements intact, no nystagmus Mouth - mucous membranes moist, pharynx normal without lesions Heart - normal rate, regular rhythm, normal S1, S2, no murmurs, rubs, clicks or gallops Abdomen  - soft, nontender, nondistended, no masses or organomegaly Neurological - alert, oriented x 1, moving all extremities, following commands, intermittently needs re-direction Extremities - peripheral pulses normal, no pedal edema, no clubbing or cyanosis Skin - normal coloration and turgor, no rashes, warm and dry Psych- flat affect, cooperative, pleasant  ED Course  Procedures (including critical care time) Labs Review Labs Reviewed  CBC - Abnormal; Notable for the following:    RBC 3.80 (*)    Hemoglobin 11.1 (*)    HCT 32.8 (*)    All other components within normal limits  COMPREHENSIVE METABOLIC PANEL - Abnormal; Notable for the following:    Potassium 3.4 (*)    Glucose, Bld 102 (*)    All other components within normal limits  ACETAMINOPHEN LEVEL - Abnormal; Notable for the following:    Acetaminophen (Tylenol), Serum <10 (*)    All other components within normal limits  LITHIUM LEVEL - Abnormal; Notable for the following:    Lithium Lvl <0.06 (*)    All other components within normal limits  VALPROIC ACID LEVEL - Abnormal; Notable for the following:    Valproic Acid Lvl <10 (*)    All other components within normal limits  SALICYLATE LEVEL  ETHANOL  URINE RAPID DRUG SCREEN, HOSP PERFORMED  ACETAMINOPHEN LEVEL    Imaging Review No results found. I have personally reviewed and evaluated these images and lab results as part of my medical decision-making.   EKG Interpretation   Date/Time:  Wednesday October 10 2015 13:49:50 EST Ventricular Rate:  53 PR Interval:  162 QRS Duration: 67 QT Interval:  424 QTC Calculation: 398 R Axis:   77 Text Interpretation:  Sinus rhythm LVH by voltage Borderline T  abnormalities, anterior leads No significant change since last tracing  Confirmed by [facility]  MD, Quinton Voth [phone]) on 10/10/2015 2:32:27 PM      MDM   Final diagnoses:  Overdose, accidental or unintentional, initial encounter  Bipolar 1 disorder ([place])    Pt  presenting after overdose of alprazolam- states she took 10-15 pills.  Pt does not know what she took, but d/w her mother at home and pill bottle is of alprazolam.  Pt placed on monitor, IV access obtained, EKG reassuring, IV bolus given.  D/w poison control they recommend 6 hour observation- also to check lithium and depakote levels given hx of bipolar.  4 hour tylenol level will be at 4pm.    3:51 PM Initial labs are reassuring, 4 hour tylenol at 4pm and UDS are pending.  If no other acute findings pt will be medically clear at 6pm and then will need psych evaluation.      Alfonzo Beers, MD 10/12/15 [phone]

## 2015-10-10 NOTE — BH Assessment (Addendum)
Tele Assessment Note   Cathy [name] is a 34 y.o. female who voluntarily presents to [facility] with SI thoughts and depression.  Pt brought in for drug overdose.  Per pt.'s family, "she took a bunch of depression medications this morning.  Pt was very lethargic upon arrival to the emerg dept, she was responding to voice command. The medical staff contacted poison control to follow appropriate protocol.  Pt is drowsy but is able to engage with this Probation officer during the interview.  Pt ingested approx 10-15 pills and she is unsure which meds she took, however per pt.'s mother she examined the pill bottle which read "alprazolam".  Pt admits 2 previous SI attempts by hanging and overdose.  Pt tel this writer--"I just gave up, but I don't want to die, I just want to be understood, I need help".   Pt says her thoughts and worsening depressive sxs are triggered by: (1) relational problems with her boyfriend; (2) family issues; (3) children taken away in 2011 and now they are in the custody of family members; (4) "givign so much of myself and not getting anything back" and (5) legal issues--court date 10/18/15 for drug charge, facing possible 36 month sentence--"I took a charge for my boyfriend". She is currently on probation.  Pt denies HI/AVH, but says she was hearing voices today with command and says this was 1st time she's heard voices since 2011 when her children were taken away.  Pt says she smokes 1 blunt, everyday.  She denies other drug or alcohol use. This consulted with Patriciaann Clan, PA who recommends inpt admission once medically cleared.  Diagnosis: Axis I: 296.54 Bipolar I disorder, Current or most recent episode depressed, With psychotic features; 304.30 Cannabis use disorder, Moderate  Past Medical History:  Past Medical History  Diagnosis Date  . Depression   . Bipolar depression ([place])   . Asthma   . Trichomonas infection   . Gonorrhea   . Pelvic inflammatory disease (PID)     Past Surgical  History  Procedure Laterality Date  . No past surgeries      Family History:  Family History  Problem Relation Age of Onset  . Diabetes Maternal Grandmother   . Hypertension Maternal Grandmother   . Diabetes Maternal Grandfather   . Hypertension Maternal Grandfather   . Diabetes Paternal Grandmother   . Hypertension Paternal Grandmother   . Diabetes Paternal Grandfather   . Hypertension Paternal Grandfather     Social History:  reports that she has been smoking Cigarettes.  She has been smoking about 1.00 pack per day. She has never used smokeless tobacco. She reports that she uses illicit drugs (Marijuana). She reports that she does not drink alcohol.  Additional Social History:  Alcohol / Drug Use Pain Medications: See MAR  Prescriptions: See MAR  Over the Counter: See MAR  History of alcohol / drug use?: Yes Longest period of sobriety (when/how long): None reported  Negative Consequences of Use: Work / Youth worker, Charity fundraiser relationships, Scientist, research (physical sciences) Withdrawal Symptoms: Other (Comment) (No w/d sxs ) Substance #1 Name of Substance 1: THC  1 - Age of First Use: 16 YOF  1 - Amount (size/oz): 1 Blunt  1 - Frequency: Daily  1 - Duration: On-going  1 - Last Use / Amount: 10/09/15  CIWA: CIWA-Ar BP: 100/70 mmHg Pulse Rate: 62 COWS:    PATIENT STRENGTHS: (choose at least two) Communication skills Motivation for treatment/growth  Allergies:  Allergies  Allergen Reactions  . Latex Rash  Home Medications:  (Not in a hospital admission)  OB/GYN Status:  No LMP recorded.  General Assessment Data Location of Assessment: [facility] ED TTS Assessment: In system Is this a Tele or Face-to-Face Assessment?: Tele Assessment Is this an Initial Assessment or a Re-assessment for this encounter?: Initial Assessment Marital status: Single Maiden name: Kaz  Is patient pregnant?: No Pregnancy Status: No Living Arrangements: Non-relatives/Friends (Lives with boyfriend's family ) Can pt  return to current living arrangement?: Yes Admission Status: Voluntary Is patient capable of signing voluntary admission?: Yes Referral Source: MD Insurance type: MCD  Medical Screening Exam ([place]) Medical Exam completed: No Reason for MSE not completed: Other: (None )  Crisis Care Plan Living Arrangements: Non-relatives/Friends (Lives with boyfriend's family ) Name of Psychiatrist: None  Name of Therapist: None   Education Status Is patient currently in school?: No Current Grade: None  Highest grade of school patient has completed: None  Name of school: None  Contact person: None   Risk to self with the past 6 months Suicidal Ideation: Yes-Currently Present Has patient been a risk to self within the past 6 months prior to admission? : Yes Suicidal Intent: Yes-Currently Present Has patient had any suicidal intent within the past 6 months prior to admission? : Yes Is patient at risk for suicide?: Yes Suicidal Plan?: Yes-Currently Present Has patient had any suicidal plan within the past 6 months prior to admission? : Yes Specify Current Suicidal Plan: Overdose  Access to Means: Yes Specify Access to Suicidal Means: Pills, Rx's  What has been your use of drugs/alcohol within the last 12 months?: Pt uses THC  Previous Attempts/Gestures: Yes How many times?: 2 Other Self Harm Risks: None  Triggers for Past Attempts: None known Intentional Self Injurious Behavior: None Family Suicide History: No Recent stressful life event(s): Other (Comment) (Pls see EPIC note ) Persecutory voices/beliefs?: No Depression: Yes Depression Symptoms: Insomnia, Tearfulness, Loss of interest in usual pleasures, Feeling worthless/self pity Substance abuse history and/or treatment for substance abuse?: Yes Suicide prevention information given to non-admitted patients: Not applicable  Risk to Others within the past 6 months Homicidal Ideation: No Does patient have any lifetime risk of  violence toward others beyond the six months prior to admission? : No Thoughts of Harm to Others: No Current Homicidal Intent: No Current Homicidal Plan: No Access to Homicidal Means: No Identified Victim: None  History of harm to others?: No Assessment of Violence: None Noted Violent Behavior Description: None  Does patient have access to weapons?: No Criminal Charges Pending?: Yes Describe Pending Criminal Charges: Drug Charge--felony  Does patient have a court date: Yes Court Date: 10/18/15 Is patient on probation?: Yes  Psychosis Hallucinations: None noted Delusions: None noted  Mental Status Report Appearance/Hygiene: In scrubs Eye Contact: Fair Motor Activity: Unremarkable Speech: Logical/coherent, Soft, Slurred Level of Consciousness: Alert, Crying Mood: Depressed, Sad Affect: Depressed, Sad, Flat Anxiety Level: None Thought Processes: Coherent, Relevant Judgement: Impaired Orientation: Person, Place, Time, Situation Obsessive Compulsive Thoughts/Behaviors: None  Cognitive Functioning Concentration: Decreased Memory: Recent Intact, Remote Intact IQ: Average Insight: Poor Impulse Control: Poor Appetite: Fair Weight Loss: 0 Weight Gain: 0 Sleep: No Change Total Hours of Sleep: 5 Vegetative Symptoms: None  ADLScreening [facility] Assessment Services) Patient's cognitive ability adequate to safely complete daily activities?: Yes Patient able to express need for assistance with ADLs?: Yes Independently performs ADLs?: Yes (appropriate for developmental age)  Prior Inpatient Therapy Prior Inpatient Therapy: No Prior Therapy Dates: None  Prior Therapy Facilty/Provider(s):  None  Reason for Treatment: None   Prior Outpatient Therapy Prior Outpatient Therapy: No Prior Therapy Dates: None  Prior Therapy Facilty/Provider(s): None  Reason for Treatment: None  Does patient have an ACCT team?: No Does patient have Intensive In-House Services?  : No Does patient have  [facility] services? : No Does patient have P4CC services?: No  ADL Screening (condition at time of admission) Patient's cognitive ability adequate to safely complete daily activities?: Yes Is the patient deaf or have difficulty hearing?: No Does the patient have difficulty seeing, even when wearing glasses/contacts?: No Does the patient have difficulty concentrating, remembering, or making decisions?: Yes Patient able to express need for assistance with ADLs?: Yes Does the patient have difficulty dressing or bathing?: No Independently performs ADLs?: Yes (appropriate for developmental age) Does the patient have difficulty walking or climbing stairs?: No Weakness of Legs: None Weakness of Arms/Hands: None  Home Assistive Devices/Equipment Home Assistive Devices/Equipment: None  Therapy Consults (therapy consults require a physician order) PT Evaluation Needed: No OT Evalulation Needed: No SLP Evaluation Needed: No Abuse/Neglect Assessment (Assessment to be complete while patient is alone) Physical Abuse: Denies Verbal Abuse: Denies Sexual Abuse: Denies Exploitation of patient/patient's resources: Denies Self-Neglect: Denies Values / Beliefs Cultural Requests During Hospitalization: None Spiritual Requests During Hospitalization: None Consults Spiritual Care Consult Needed: No Social Work Consult Needed: No Regulatory affairs officer (For Healthcare) Does patient have an advance directive?: No    Additional Information 1:1 In Past 12 Months?: No CIRT Risk: No Elopement Risk: No Does patient have medical clearance?: Yes     Disposition:  Disposition Initial Assessment Completed for this Encounter: Yes Disposition of Patient: Inpatient treatment program, Referred to (Per Patriciaann Clan, PA meets criteria for inpt admission ) Type of inpatient treatment program: Adult Patient referred to: Other (Comment) (Per Patriciaann Clan, PA meets criteria for inpt admission )  [place] Rm 10/10/2015 10:29 PM

## 2015-10-10 NOTE — ED Notes (Signed)
Pt here for drug overdose. Per family pt took a bunch of depression meds this am. Pt very lethargic at triage. responding to voice.

## 2015-10-10 NOTE — ED Provider Notes (Addendum)
Patient initially seen and evaluated by Dr. Canary Brim after an intentional overdose. She was signed out to me to check acetaminophen level four hours after ingestion. This level has come back undetectable. I went to reevaluate the patient and she was noted to be too somnolent to do psychiatric interview. However, she was maintaining her airway and oxygen saturations. She was observed for another 2 hours and now she is arousable and able to hold a conversation. TTS consultation has been requested.  Delora Fuel, MD [phone]  TTS consult appreciated - patient needs inpatient psychiatric care, and is willing to be admitted voluntarily.  Delora Fuel, MD [phone] [phone]

## 2015-10-10 NOTE — ED Notes (Addendum)
Pt's boyfriend took all belongings with him.  Pt aware.  Boyfriend advised of visiting and phone call policies.  Given Pod C information sheet.  Stated he would not return during visiting hours today but possibly in the morning.

## 2015-10-11 DIAGNOSIS — F329 Major depressive disorder, single episode, unspecified: Secondary | ICD-10-CM | POA: Diagnosis not present

## 2015-10-11 LAB — POC URINE PREG, ED: PREG TEST UR: NEGATIVE

## 2015-10-11 NOTE — ED Notes (Signed)
Family in room visiting at this time.

## 2015-10-11 NOTE — ED Notes (Signed)
Patient refused to have vital signs taken. Sitter stated patient said she's getting upset and will mess her room up if her boyfriend don't call her soon.

## 2015-10-11 NOTE — ED Notes (Signed)
Patient stating "I am going to leave." this RN made patient aware that a police officer is present and she will be unable to leave if she tries. [employer] officer speaking with patient, [employer] made patient aware she is not allowed to leave at this time.

## 2015-10-11 NOTE — ED Notes (Signed)
Spoke with Dr. Betsey Holiday who advised IVC paperwork was completed. Secretary in [place] faxed papers to [employer] with fax confirmation of delivery. Awaiting law enforcement to serve patient with papers at this time.

## 2015-10-11 NOTE — ED Notes (Signed)
No harm contract signed by patient prior to discharge.

## 2015-10-11 NOTE — ED Notes (Signed)
Charge Nurse in room talking with patient at this time.

## 2015-10-11 NOTE — ED Notes (Signed)
Cathy [name], Agricultural consultant in room to speak with patient.

## 2015-10-11 NOTE — ED Notes (Signed)
Cathy [name], CSW called advising patient has been cleared for discharge and IVC paperwork to be rescended, advised she will have the [facility] NP contact Dr. Colin Rhein to make him aware of this.

## 2015-10-11 NOTE — ED Notes (Signed)
[place] advised there is another order for TTS assessment, [facility] advised that since patient was assessed less than 12 hours ago, another TTS assessment is not warranted. [place] advised that TTS consult order could be discontinued and consult to psych order be placed to see if patient could be reassessed by a Nurse Practitioner.

## 2015-10-11 NOTE — ED Notes (Signed)
Ava from [facility] advised since patient had assessment less than 12 hours ago and was brought into the ED for an overdose, there is nothing warranting a TTS reassessment at this time.

## 2015-10-11 NOTE — ED Notes (Signed)
Fax confirmation of change of commitment received.

## 2015-10-11 NOTE — ED Notes (Signed)
Patient's sitter reports that patient stated she "did not know how," but that she was going to "find a way to get out of here."

## 2015-10-11 NOTE — ED Provider Notes (Signed)
Patient seen yesterday for intentional drug overdose. Patient has a long history of mental illness. She admits to command hallucinations and was actively suicidal at her arrival. TTS consultation recommended hospitalization. Patient is now stating that she would like to leave. She is not safe for discharge, involuntary commitment was initiated.  Orpah Greek, MD 10/11/15 [phone]

## 2015-10-11 NOTE — Consult Note (Signed)
Telepsych Consultation   Reason for Consult:   Referring Physician:  [facility] ED Provider Patient Identification: Cathy [name] MRN:  [id number] Principal Diagnosis: MDD (major depressive disorder) ([place]) Diagnosis:   Patient Active Problem List   Diagnosis Date Noted  . MDD (major depressive disorder) ([place]) [F32.9] 10/11/2015    Priority: High  . Prenatal care insufficient [O09.30] 09/22/2011  . Large for dates pregnancy [O36.60X0] 09/22/2011  . Asthma [493] 09/22/2011    Total Time spent with patient: 45 minutes  Subjective:   Cathy [name] is a 34 y.o. female patient admitted with overdose of meds.  HPI:  Background information as follows Per TTS note:  Cathy [name] is a 34 y.o. female who voluntarily presented to [facility] with SI thoughts and depression. Pt brought in for drug overdose. Per pt she took a bunch of her medication, :Started with the letter "A".  She is unable to recall the name of the meds.  The medical staff contacted poison control to follow appropriate protocol. Pt is drowsy but is able to engage with this Probation officer during the interview. Pt ingested approx 10-15 pills and she is unsure which meds she took, however per pt.'s mother she examined the pill bottle which read "alprazolam". Pt admits 2 previous SI attempts by hanging and overdose. Pt tel this writer--"I just gave up, but I don't want to die, I just want to be understood, I need help". Pt says her thoughts and worsening depressive sxs are triggered by: (1) relational problems with her boyfriend; (2) family issues; (3) children taken away in 2011 and now they are in the custody of family members; (4) "givign so much of myself and not getting anything back" and (5) legal issues--court date 10/18/15 for drug charge, facing possible 36 month sentence--"I took a charge for my boyfriend". She is currently on probation. Pt denies HI/AVH, but says she was hearing voices today with command and says this was 1st time  she's heard voices since 2011 when her children were taken away. Pt says she smokes 1 blunt, everyday. She denies other drug or alcohol use.   Today, spoke with [name] this morning via telepsych at the request pf the patient. She was initially seen by TTS counselor and then run by Patriciaann Clan NP who recommended inpatient services. Patient states that she does not recall talking to someone. "I don't remember talking to anyone. I feel bad that I did what I did. I took too much Atarax. I have 4 kids and I would not want to hurt myself."   Dr Dwyane Dee consulted and she is in agreement with patient being discharged.  Collateral information obtained from her BF, Beverely Low who states that he will make sure that patient will make her appointments at [facility] for outpatient counseling.    Patient was last seen by [facility] on 01/10/2015, [address] in [place]. [phone].  Spoke with intake person Sherlean Foot. Pending appt to be given by them          HPI Elements:   See HPI  Past Medical History:  Past Medical History  Diagnosis Date  . Depression   . Bipolar depression ([place])   . Asthma   . Trichomonas infection   . Gonorrhea   . Pelvic inflammatory disease (PID)     Past Surgical History  Procedure Laterality Date  . No past surgeries     Family History:  Family History  Problem Relation Age of Onset  . Diabetes Maternal Grandmother   . Hypertension Maternal Grandmother   . Diabetes Maternal Grandfather   . Hypertension Maternal Grandfather   . Diabetes Paternal Grandmother   . Hypertension Paternal Grandmother   . Diabetes Paternal Grandfather   . Hypertension Paternal Grandfather    Social History:  History  Alcohol Use No     History  Drug Use  . Yes  . Special: Marijuana    Social History   Social History  . Marital Status: Single    Spouse Name: N/A  . Number of Children: N/A  . Years of  Education: N/A   Social History Main Topics  . Smoking status: Current Some Day Smoker -- 1.00 packs/day    Types: Cigarettes  . Smokeless tobacco: Never Used  . Alcohol Use: No  . Drug Use: Yes    Special: Marijuana  . Sexual Activity: Yes    Birth Control/ Protection: None   Other Topics Concern  . None   Social History Narrative   Additional Social History:    Pain Medications: See MAR  Prescriptions: See MAR  Over the Counter: See MAR  History of alcohol / drug use?: Yes Longest period of sobriety (when/how long): None reported  Negative Consequences of Use: Work / Youth worker, Charity fundraiser relationships, Scientist, research (physical sciences) Withdrawal Symptoms: Other (Comment) (No w/d sxs ) Name of Substance 1: THC  1 - Age of First Use: 16 YOF  1 - Amount (size/oz): 1 Blunt  1 - Frequency: Daily  1 - Duration: On-going  1 - Last Use / Amount: 10/09/15    Allergies:   Allergies  Allergen Reactions  . Latex Rash    Labs:  Results for orders placed or performed during the hospital encounter of 10/10/15 (from the past 48 hour(s))  CBC     Status: Abnormal   Collection Time: 10/10/15  1:48 PM  Result Value Ref Range   WBC 5.4 4.0 - 10.5 K/uL   RBC 3.80 (L) 3.87 - 5.11 MIL/uL   Hemoglobin 11.1 (L) 12.0 - 15.0 g/dL   HCT 32.8 (L) 36.0 - 46.0 %   MCV 86.3 78.0 - 100.0 fL   MCH 29.2 26.0 - 34.0 pg   MCHC 33.8 30.0 - 36.0 g/dL   RDW 13.1 11.5 - 15.5 %   Platelets 165 150 - 400 K/uL  Comprehensive metabolic panel     Status: Abnormal   Collection Time: 10/10/15  1:48 PM  Result Value Ref Range   Sodium 139 135 - 145 mmol/L   Potassium 3.4 (L) 3.5 - 5.1 mmol/L   Chloride 108 101 - 111 mmol/L   CO2 23 22 - 32 mmol/L   Glucose, Bld 102 (H) 65 - 99 mg/dL   BUN 9 6 - 20 mg/dL   Creatinine, Ser 0.82 0.44 - 1.00 mg/dL   Calcium 8.9 8.9 - 10.3 mg/dL   Total Protein 7.1 6.5 - 8.1 g/dL   Albumin 3.5 3.5 - 5.0 g/dL   AST 18 15 - 41 U/L   ALT 14 14 - 54 U/L   Alkaline Phosphatase 55 38 - 126 U/L   Total  Bilirubin 0.4 0.3 - 1.2 mg/dL   GFR calc non Af Amer >60 >60 mL/min   GFR calc Af Amer >60 >60 mL/min    Comment: (NOTE) The eGFR has been calculated using the CKD EPI equation. This calculation has not been validated in all clinical situations. eGFR's persistently <60 mL/min signify possible  Chronic Kidney Disease.    Anion gap 8 5 - 15  Acetaminophen level     Status: Abnormal   Collection Time: 10/10/15  1:48 PM  Result Value Ref Range   Acetaminophen (Tylenol), Serum <10 (L) 10 - 30 ug/mL    Comment:        THERAPEUTIC CONCENTRATIONS VARY SIGNIFICANTLY. A RANGE OF 10-30 ug/mL MAY BE AN EFFECTIVE CONCENTRATION FOR MANY PATIENTS. HOWEVER, SOME ARE BEST TREATED AT CONCENTRATIONS OUTSIDE THIS RANGE. ACETAMINOPHEN CONCENTRATIONS >150 ug/mL AT 4 HOURS AFTER INGESTION AND >50 ug/mL AT 12 HOURS AFTER INGESTION ARE OFTEN ASSOCIATED WITH TOXIC REACTIONS.   Salicylate level     Status: None   Collection Time: 10/10/15  1:48 PM  Result Value Ref Range   Salicylate Lvl <4.9 2.8 - 30.0 mg/dL  Ethanol     Status: None   Collection Time: 10/10/15  1:48 PM  Result Value Ref Range   Alcohol, Ethyl (B) <5 <5 mg/dL    Comment:        LOWEST DETECTABLE LIMIT FOR SERUM ALCOHOL IS 5 mg/dL FOR MEDICAL PURPOSES ONLY   Lithium level     Status: Abnormal   Collection Time: 10/10/15  1:48 PM  Result Value Ref Range   Lithium Lvl <0.06 (L) 0.60 - 1.20 mmol/L  Valproic acid level     Status: Abnormal   Collection Time: 10/10/15  1:48 PM  Result Value Ref Range   Valproic Acid Lvl <10 (L) 50.0 - 100.0 ug/mL    Comment: RESULTS CONFIRMED BY MANUAL DILUTION  Urine rapid drug screen (hosp performed)     Status: Abnormal   Collection Time: 10/10/15  3:15 PM  Result Value Ref Range   Opiates NONE DETECTED NONE DETECTED   Cocaine NONE DETECTED NONE DETECTED   Benzodiazepines POSITIVE (A) NONE DETECTED   Amphetamines NONE DETECTED NONE DETECTED   Tetrahydrocannabinol POSITIVE (A) NONE  DETECTED   Barbiturates NONE DETECTED NONE DETECTED    Comment:        DRUG SCREEN FOR MEDICAL PURPOSES ONLY.  IF CONFIRMATION IS NEEDED FOR ANY PURPOSE, NOTIFY LAB WITHIN 5 DAYS.        LOWEST DETECTABLE LIMITS FOR URINE DRUG SCREEN Drug Class       Cutoff (ng/mL) Amphetamine      1000 Barbiturate      200 Benzodiazepine   449 Tricyclics       675 Opiates          300 Cocaine          300 THC              50   Acetaminophen level     Status: Abnormal   Collection Time: 10/10/15  4:36 PM  Result Value Ref Range   Acetaminophen (Tylenol), Serum <10 (L) 10 - 30 ug/mL    Comment:        THERAPEUTIC CONCENTRATIONS VARY SIGNIFICANTLY. A RANGE OF 10-30 ug/mL MAY BE AN EFFECTIVE CONCENTRATION FOR MANY PATIENTS. HOWEVER, SOME ARE BEST TREATED AT CONCENTRATIONS OUTSIDE THIS RANGE. ACETAMINOPHEN CONCENTRATIONS >150 ug/mL AT 4 HOURS AFTER INGESTION AND >50 ug/mL AT 12 HOURS AFTER INGESTION ARE OFTEN ASSOCIATED WITH TOXIC REACTIONS.   POC urine preg, ED (not at [facility])     Status: None   Collection Time: 10/11/15  5:15 AM  Result Value Ref Range   Preg Test, Ur NEGATIVE NEGATIVE    Comment:        THE SENSITIVITY OF THIS METHODOLOGY IS >  24 mIU/mL     Vitals: Blood pressure 121/75, pulse 57, temperature 98.3 F (36.8 C), temperature source Oral, resp. rate 18, SpO2 100 %.  Risk to Self: Suicidal Ideation: Yes-Currently Present Suicidal Intent: Yes-Currently Present Is patient at risk for suicide?: Yes Suicidal Plan?: Yes-Currently Present Specify Current Suicidal Plan: Overdose  Access to Means: Yes Specify Access to Suicidal Means: Pills, Rx's  What has been your use of drugs/alcohol within the last 12 months?: Pt uses THC  How many times?: 2 Other Self Harm Risks: None  Triggers for Past Attempts: None known Intentional Self Injurious Behavior: None Risk to Others: Homicidal Ideation: No Thoughts of Harm to Others: No Current Homicidal Intent: No Current Homicidal  Plan: No Access to Homicidal Means: No Identified Victim: None  History of harm to others?: No Assessment of Violence: None Noted Violent Behavior Description: None  Does patient have access to weapons?: No Criminal Charges Pending?: Yes Describe Pending Criminal Charges: Drug Charge--felony  Does patient have a court date: Yes Court Date: 10/18/15 Prior Inpatient Therapy: Prior Inpatient Therapy: No Prior Therapy Dates: None  Prior Therapy Facilty/Provider(s): None  Reason for Treatment: None  Prior Outpatient Therapy: Prior Outpatient Therapy: No Prior Therapy Dates: None  Prior Therapy Facilty/Provider(s): None  Reason for Treatment: None  Does patient have an ACCT team?: No Does patient have Intensive In-House Services?  : No Does patient have [facility] services? : No Does patient have P4CC services?: No  Current Facility-Administered Medications  Medication Dose Route Frequency Provider Last Rate Last Dose  . albuterol (PROVENTIL HFA;VENTOLIN HFA) 108 (90 BASE) MCG/ACT inhaler 2 puff  2 puff Inhalation Q4H PRN Alfonzo Beers, MD      . famotidine (PEPCID) tablet 20 mg  20 mg Oral BID PRN Alfonzo Beers, MD      . ondansetron [facility]) tablet 4 mg  4 mg Oral Q8H PRN Alfonzo Beers, MD       Current Outpatient Prescriptions  Medication Sig Dispense Refill  . albuterol (PROVENTIL HFA;VENTOLIN HFA) 108 (90 BASE) MCG/ACT inhaler Inhale 2 puffs into the lungs every 4 (four) hours as needed for wheezing or shortness of breath. 1 Inhaler 3  . albuterol (PROVENTIL HFA;VENTOLIN HFA) 108 (90 BASE) MCG/ACT inhaler Inhale 2 puffs into the lungs every 4 (four) hours as needed for wheezing or shortness of breath. 1 Inhaler 3  . albuterol (PROVENTIL) (2.5 MG/3ML) 0.083% nebulizer solution Take 3 mLs (2.5 mg total) by nebulization every 4 (four) hours as needed for wheezing or shortness of breath. 25 vial 0  . amoxicillin (AMOXIL) 500 MG capsule Take 1 capsule (500 mg total) by mouth 3 (three) times  daily. 30 capsule 0  . famotidine (PEPCID) 20 MG tablet Take 1 tablet (20 mg total) by mouth 2 (two) times daily as needed (itching). 15 tablet 0  . HYDROcodone-acetaminophen (NORCO) 5-325 MG per tablet Take 1 tablet by mouth every 6 (six) hours as needed for moderate pain. 15 tablet 0  . hydrOXYzine (ATARAX/VISTARIL) 25 MG tablet Take 1 tablet (25 mg total) by mouth every 4 (four) hours as needed for itching. 12 tablet 0  . ibuprofen (ADVIL,MOTRIN) 200 MG tablet Take 600 mg by mouth every 6 (six) hours as needed for headache or moderate pain.     . naproxen (NAPROSYN) 500 MG tablet Take 1 tablet (500 mg total) by mouth 2 (two) times daily. 30 tablet 0    Musculoskeletal: Strength & Muscle Tone: within normal limits Gait & Station: normal Patient  leans: N/A  Psychiatric Specialty Exam: Physical Exam  Psychiatric: Her mood appears not anxious. She is not aggressive. Thought content is not paranoid and not delusional. She does not express impulsivity. She does not exhibit a depressed mood. She expresses no homicidal and no suicidal ideation. She expresses no suicidal plans and no homicidal plans.    ROS  Blood pressure 121/75, pulse 57, temperature 98.3 F (36.8 C), temperature source Oral, resp. rate 18, SpO2 100 %.There is no weight on file to calculate BMI.  General Appearance: Neat  Eye Contact::  Good  Speech:  Clear and Coherent  Volume:  Normal  Mood:  Anxious  Affect:  Appropriate  Thought Process:  Coherent  Orientation:  Full (Time, Place, and Person)  Thought Content:  Rumination  Suicidal Thoughts:  No  Homicidal Thoughts:  No  Memory:  Immediate;   Good Recent;   Good Remote;   Good  Judgement:  Fair  Insight:  Fair  Psychomotor Activity:  Normal  Concentration:  Good  Recall:  Good  Fund of Knowledge:Good  Language: Good  Akathisia:  Negative  Handed:  Right  AIMS (if indicated):     Assets:  Desire for Improvement Physical Health Resilience Social Support   ADL's:  Intact  Cognition: WNL  Sleep:  fair   Medical Decision Making: Review of Psycho-Social Stressors (1), Discuss test with performing physician (1), Review and summation of old records (2) and Review of Medication Regimen & Side Effects (2)  Treatment Plan Summary: 1.  Take all your medications as prescribed.              2.  Report any adverse side effects to outpatient provider.                       3.  Patient instructed to not use alcohol or illegal drugs while on prescription medicines.            4.  In the event of worsening symptoms, instructed patient to call 911, the crisis hotline or go to nearest emergency room for evaluation of symptoms.  Plan:  No evidence of imminent risk to self or others at present.   Patient does not meet criteria for psychiatric inpatient admission. Supportive therapy provided about ongoing stressors. Discussed crisis plan, support from social network, calling 911, coming to the Emergency Department, and calling Suicide Hotline.  Disposition: Home  PLEASE HAVE NURSING GIVE MS. Uriarte' BOYFRIEND THE NUMBER TO [facility] [phone].  Pending appt need to be made.  Also indicated in the patient discharge summary.  Freda Munro May Frazier Balfour AGNP-BC 10/11/2015 2:04 PM

## 2015-10-11 NOTE — ED Notes (Signed)
Patient is walking in hallway at this time.

## 2015-10-11 NOTE — ED Notes (Signed)
Pt stated that she wants to leave the emergency room at this time. Dr. Betsey Holiday notified. Dr. Betsey Holiday to make patient an IVC. Pt attempted to leave and was stopped, security notified and stood by patient's room. Patient back in room at this time.

## 2015-10-11 NOTE — ED Notes (Signed)
Patient attempting to leave out the door down the hall from pod c. Pt escorted back to the room by sitter and charge tech. Pt yelling at staff, cursing and throwing stuff in her room. Security and gpd at bedside.

## 2015-10-11 NOTE — ED Notes (Signed)
Patient called this RN into the room. Patient stating she does not understand why she can't leave. This RN explained to patient that since she admitted wanting to hurt herself last night and stated she was going to leave this am, she now does not have the choice of staying or going. Pt reports "I did not bring myself here voluntarily and I did not want to come here, I can't help what I said last night when I was under the influence." Pt advised the police would get involved if she attempted to leave. Pt stated "will I be able to get home or will the police stop me here?" patient advised police officers are present in the hospital it will only make things more difficult if she does not cooperate and tries to leave. Pt states "I will make my decision about what im going to do soon." Animal nutritionist remains in [place] [address]

## 2015-10-11 NOTE — ED Notes (Addendum)
Jinny Blossom, CSW at [facility] contacted about patient, stated that [facility] NP is speaking with patient's boyfriend now and that they are working on possible outpatient treatment and rescending of IVC paperwork for patient. Megan advised she would call this RN back with an update as soon as she has one. Dr. Colin Rhein made aware of current situation and that we are awaiting an update from [facility].

## 2015-10-11 NOTE — ED Notes (Signed)
Patient talking with sitter about not having her cell phone stating "this is just another reason why I want to just run out of here," Security presence in pod C at this time. Security aware that patient is possible flight risk.

## 2015-10-11 NOTE — ED Notes (Signed)
Patient requesting shower, patient's sitter provided with hygiene essentials and clean scrubs for patient and escorted patient to shower.

## 2015-10-11 NOTE — ED Notes (Addendum)
Patient stating she would like to see the charge nurse, when this RN inquired if I could help her with something, patient stated she wanted to speak to the charge RN in private about confidential stuff. [place], Agricultural consultant made aware.

## 2015-10-11 NOTE — ED Notes (Signed)
Patient asking this RN when she is going somewhere else. This RN advised I would let the patient know of any updates as soon as possible. Patient stated, "I don't know what I told that lady on the computer last night, I want to talk to her again." Patient advised reassessment through TTS would be based on the physician and [facility].

## 2015-10-11 NOTE — Progress Notes (Signed)
Pt was re-evaluated by psychiatry vie telemed machine this morning. Per Philis Pique NP and Dr. Dwyane Dee, pt no longer requires inpatient psychiatric treatment and IVC can be rescinded so that pt may be d/c to follow up with outpatient providers.  NP spoke with pt's previous provider, [facility], and made referral for pt to return. [facility] unable to call back with appointment time this afternoon, therefore advises pt call number [phone] in pt's d/c summary) to follow up about scheduling new appt.  NP also spoke with pt's boyfriend for collateral information, and he reports he will pick pt up this afternoon from ED.  Sharren Bridge, MSW, LCSW Clinical Social Work, Disposition  10/11/2015 [phone]

## 2015-10-11 NOTE — ED Notes (Signed)
Patient was given a snack and drink, a regular diet order was taken, for lunch.

## 2015-10-11 NOTE — Discharge Instructions (Signed)
Drug Overdose Drug overdose happens when you take too much of a drug. An overdose can occur with illegal drugs, prescription drugs, or over-the-counter (OTC) drugs. The effects of drug overdose can be mild, dangerous, or even deadly. CAUSES Drug overdose may be caused by:  Taking too much of a drug on purpose.  Taking too much of a drug by accident.  An error made by a health care provider who prescribes a drug.  An error made by a pharmacist who fills the prescription order. Drugs that commonly cause overdose include:  Mental health drugs.  Pain medicines.  Illegal drugs.  OTC cough and cold medicines.  Heart medicines.  Seizure medicines. RISK FACTORS Drug overdose is more likely in:  Children. They may be attracted to colorful pills. Because of children's small size, even a small amount of a drug can be dangerous.  Elderly people. They may be taking many different drugs. Elderly people may have difficulty reading labels or remembering when they last took their medicine. The risk of drug overdose is also higher for someone who:  Takes illegal drugs.  Takes a drug and drinks alcohol.  Has a mental health condition. SYMPTOMS Signs and symptoms of drug overdose depend on the drug and the amount that was taken. Common danger signs include:  Behavior changes.  Sleepiness.  Slowed breathing.  Nausea and vomiting.  Seizures.  Changes in eye pupil size (very large or very small). If there are signs of very low blood pressure from a drug overdose (shock), emergency treatment is required. These signs include:  Cold and clammy skin.  Pale skin.  Blue lips.  Very slow breathing.  Extreme sleepiness.  Loss of consciousness. DIAGNOSIS Drug overdose may be diagnosed based on your symptoms. It is important that you tell your health care provider:  All of the drugs that you have taken.  When you took the drugs.  Whether you were drinking alcohol. Your health  care provider will do a physical exam. This exam may include:  Checking and monitoring your heart rate and rhythm, your temperature, and your blood pressure (vital signs).  Checking your breathing and oxygen level. You may also have tests, including:   Urine tests to check for drugs in your system.  Blood tests to check for:  Drugs in your system.  Signs of an imbalance of your blood minerals (electrolytes).  Liver damage.  Kidney damage. TREATMENT Supporting your vital signs and your breathing is the first step in treating a drug overdose. Treatment may also include:  Receiving fluids and electrolytes through an IV tube.  Having a breathing tube (endotracheal tube) inserted in your airway to help you breathe.  Having a tube passed through your nose and into your stomach (nasogastric tube) to wash out your stomach.  Medicines. You may get medicines to:  Make you vomit.  Absorb any medicine that is left in your digestive system (activated charcoal).  Block or reverse the effect of the drug that caused the overdose.  Having your blood filtered through an artificial kidney machine (hemodialysis). You may need this if your overdose is severe or if you have kidney failure.  Having ongoing counseling and mental health support if you intentionally overdosed or used an illegal drug. HOME CARE INSTRUCTIONS  Take medicines only as directed by your health care provider. Always ask your health care provider to discuss the possible side effects of any new drug that you start taking.  Keep a list of all of the drugs  that you take, including over-the-counter medicines. Bring this list with you to all of your medical visits.  Read the drug inserts that come with your medicines.  Do not use illegal drugs.  Do not drink alcohol when taking drugs.  Store all medicines in safety containers that are out of the reach of children.  Keep the phone number of your local poison control  center near your phone or on your cell phone.  Get help if you are struggling with alcohol or drug use.  Get help if you are struggling with depression or another mental health problem.  Keep all follow-up visits as directed by your health care provider. This is important. SEEK MEDICAL CARE IF:  Your symptoms return.  You develop any new signs or symptoms when you are taking medicines. SEEK IMMEDIATE MEDICAL CARE IF:  You think that you or someone else may have taken too much of a drug. The hotline of the [facility] is 570-013-4499.  You or someone else is having symptoms of a drug overdose.  You have serious thoughts about hurting yourself or others.  You have chest pain.  You have difficulty breathing.  You have a loss of consciousness. Drug overdose is an emergency. Do not wait to see if the symptoms will go away. Get medical help right away. Call your local emergency services (911 in the [place] Do not drive yourself to the hospital.   This information is not intended to replace advice given to you by your health care provider. Make sure you discuss any questions you have with your health care provider.   Document Released: 04/03/2015 Document Reviewed: 04/03/2015 Elsevier Interactive Patient Education [employer].

## 2015-10-11 NOTE — ED Notes (Signed)
Notice of Commitment change faxed to clerk of court.

## 2015-10-11 NOTE — ED Notes (Signed)
Megan CSW from [facility] called back stating that Dr. Dwyane Dee advised another TTS assessment could be done. TTS machine placed at bedside, patient being assessed at this time.

## 2016-04-13 ENCOUNTER — Emergency Department (HOSPITAL_COMMUNITY): Payer: Medicaid Other

## 2016-04-13 ENCOUNTER — Encounter (HOSPITAL_COMMUNITY): Payer: Self-pay

## 2016-04-13 ENCOUNTER — Emergency Department (HOSPITAL_COMMUNITY)
Admission: EM | Admit: 2016-04-13 | Discharge: 2016-04-13 | Disposition: A | Discharge status: Home / Self Care | Payer: Medicaid Other | Attending: Emergency Medicine | Admitting: Emergency Medicine

## 2016-04-13 DIAGNOSIS — Z791 Long term (current) use of non-steroidal anti-inflammatories (NSAID): Secondary | ICD-10-CM | POA: Insufficient documentation

## 2016-04-13 DIAGNOSIS — J45909 Unspecified asthma, uncomplicated: Secondary | ICD-10-CM | POA: Diagnosis not present

## 2016-04-13 DIAGNOSIS — Z91040 Latex allergy status: Secondary | ICD-10-CM | POA: Insufficient documentation

## 2016-04-13 DIAGNOSIS — Z7951 Long term (current) use of inhaled steroids: Secondary | ICD-10-CM | POA: Diagnosis not present

## 2016-04-13 DIAGNOSIS — Z792 Long term (current) use of antibiotics: Secondary | ICD-10-CM | POA: Diagnosis not present

## 2016-04-13 DIAGNOSIS — R55 Syncope and collapse: Secondary | ICD-10-CM | POA: Insufficient documentation

## 2016-04-13 DIAGNOSIS — Z79891 Long term (current) use of opiate analgesic: Secondary | ICD-10-CM | POA: Insufficient documentation

## 2016-04-13 DIAGNOSIS — Z79899 Other long term (current) drug therapy: Secondary | ICD-10-CM | POA: Insufficient documentation

## 2016-04-13 DIAGNOSIS — F319 Bipolar disorder, unspecified: Secondary | ICD-10-CM | POA: Insufficient documentation

## 2016-04-13 DIAGNOSIS — F17210 Nicotine dependence, cigarettes, uncomplicated: Secondary | ICD-10-CM | POA: Diagnosis not present

## 2016-04-13 DIAGNOSIS — R079 Chest pain, unspecified: Secondary | ICD-10-CM | POA: Diagnosis not present

## 2016-04-13 LAB — CBC
HEMATOCRIT: 36.7 % (ref 36.0–46.0)
HEMOGLOBIN: 12.7 g/dL (ref 12.0–15.0)
MCH: 29.6 pg (ref 26.0–34.0)
MCHC: 34.6 g/dL (ref 30.0–36.0)
MCV: 85.5 fL (ref 78.0–100.0)
Platelets: 206 10*3/uL (ref 150–400)
RBC: 4.29 MIL/uL (ref 3.87–5.11)
RDW: 13.3 % (ref 11.5–15.5)
WBC: 5.1 10*3/uL (ref 4.0–10.5)

## 2016-04-13 LAB — URINALYSIS, ROUTINE W REFLEX MICROSCOPIC
Bilirubin Urine: NEGATIVE
Glucose, UA: NEGATIVE mg/dL
Hgb urine dipstick: NEGATIVE
KETONES UR: NEGATIVE mg/dL
LEUKOCYTES UA: NEGATIVE
NITRITE: NEGATIVE
PH: 6.5 (ref 5.0–8.0)
Protein, ur: NEGATIVE mg/dL
SPECIFIC GRAVITY, URINE: 1.023 (ref 1.005–1.030)

## 2016-04-13 LAB — BASIC METABOLIC PANEL
ANION GAP: 6 (ref 5–15)
BUN: 16 mg/dL (ref 6–20)
CHLORIDE: 104 mmol/L (ref 101–111)
CO2: 24 mmol/L (ref 22–32)
Calcium: 9.2 mg/dL (ref 8.9–10.3)
Creatinine, Ser: 0.91 mg/dL (ref 0.44–1.00)
GFR calc Af Amer: >60 mL/min (ref >60)
Glucose, Bld: 87 mg/dL (ref 65–99)
POTASSIUM: 4.2 mmol/L (ref 3.5–5.1)
SODIUM: 134 mmol/L — AB (ref 135–145)

## 2016-04-13 LAB — I-STAT TROPONIN, ED: TROPONIN I, POC: 0 ng/mL (ref 0.00–0.08)

## 2016-04-13 LAB — D-DIMER, QUANTITATIVE (NOT AT ARMC): D DIMER QUANT: 0.27 ug{FEU}/mL (ref 0.00–0.50)

## 2016-04-13 LAB — CBG MONITORING, ED: Glucose-Capillary: 89 mg/dL (ref 65–99)

## 2016-04-13 LAB — I-STAT BETA HCG BLOOD, ED (MC, WL, AP ONLY)

## 2016-04-13 LAB — TROPONIN I: Troponin I: <0.03 ng/mL (ref <0.031)

## 2016-04-13 NOTE — Discharge Instructions (Signed)
Your work-up today was overall normal. Please follow-up with your primary care physician and discuss this ED visit. Return here for new concerns.

## 2016-04-13 NOTE — ED Provider Notes (Signed)
CSN: [id number]     Arrival date & time 04/13/16  1717 History   First MD Initiated Contact with Patient 04/13/16 1820     Chief Complaint  Patient presents with  . Loss of Consciousness  . Panic Attack  . Chest Pain     (Consider location/radiation/quality/duration/timing/severity/associated sxs/prior Treatment) Patient is a 35 y.o. female presenting with syncope and chest pain. The history is provided by the patient and medical records.  Loss of Consciousness Associated symptoms: chest pain   Chest Pain Associated symptoms: syncope     This is a 35 year old female with history of depression, bipolar disorder, presenting to the ED after a syncopal episode. Patient was at work at [employer] when this occurred. She states she was walking across the floor and begin to feel very lightheaded, sweaty, and nauseated. She states it aches and she notes she woke up on the floor with her manager standing over her. No reported seizure activity.  She states shortly after she developed some central chest pain, worse with deep breathing. She denies any shortness of breath, palpitations, dizziness, or weakness currently. She has no known cardiac history. No history of DVT or PE. States blood clots to run in her family. No recent travel, surgery, prolonged immobilization, or exogenous estrogens. Patient does admit to multiple syncopal episodes in the past. She states she was playing to follow-up with her primary care doctor about this next week. She has no known neurologic issues or history of seizure disorder.    Past Medical History  Diagnosis Date  . Depression   . Bipolar depression ([place])   . Asthma   . Trichomonas infection   . Gonorrhea   . Pelvic inflammatory disease (PID)    Past Surgical History  Procedure Laterality Date  . No past surgeries     Family History  Problem Relation Age of Onset  . Diabetes Maternal Grandmother   . Hypertension Maternal Grandmother   . Diabetes Maternal  Grandfather   . Hypertension Maternal Grandfather   . Diabetes Paternal Grandmother   . Hypertension Paternal Grandmother   . Diabetes Paternal Grandfather   . Hypertension Paternal Grandfather    Social History  Substance Use Topics  . Smoking status: Current Some Day Smoker -- 0.50 packs/day    Types: Cigarettes  . Smokeless tobacco: Never Used  . Alcohol Use: No   OB History    Gravida Para Term Preterm AB TAB SAB Ectopic Multiple Living   5 4 4  0 1 0 1 0 0 4     Review of Systems  Cardiovascular: Positive for chest pain and syncope.  Neurological: Positive for syncope.  All other systems reviewed and are negative.     Allergies  Latex  Home Medications   Prior to Admission medications   Medication Sig Start Date End Date Taking? Authorizing Provider  albuterol (PROVENTIL HFA;VENTOLIN HFA) 108 (90 BASE) MCG/ACT inhaler Inhale 2 puffs into the lungs every 4 (four) hours as needed for wheezing or shortness of breath. 05/01/14   Orpah Greek, MD  albuterol (PROVENTIL HFA;VENTOLIN HFA) 108 (90 BASE) MCG/ACT inhaler Inhale 2 puffs into the lungs every 4 (four) hours as needed for wheezing or shortness of breath. 03/13/15   Britt Bottom, NP  albuterol (PROVENTIL) (2.5 MG/3ML) 0.083% nebulizer solution Take 3 mLs (2.5 mg total) by nebulization every 4 (four) hours as needed for wheezing or shortness of breath. 05/01/14   Orpah Greek, MD  amoxicillin (AMOXIL) 500 MG capsule  Take 1 capsule (500 mg total) by mouth 3 (three) times daily. 04/18/15   Hope Bunnie Pion, NP  famotidine (PEPCID) 20 MG tablet Take 1 tablet (20 mg total) by mouth 2 (two) times daily as needed (itching). 05/27/14   Nicole Pisciotta, PA-C  HYDROcodone-acetaminophen (NORCO) 5-325 MG per tablet Take 1 tablet by mouth every 6 (six) hours as needed for moderate pain. 04/18/15   Hope Bunnie Pion, NP  hydrOXYzine (ATARAX/VISTARIL) 25 MG tablet Take 1 tablet (25 mg total) by mouth every 4 (four) hours as  needed for itching. 05/27/14   Nicole Pisciotta, PA-C  ibuprofen (ADVIL,MOTRIN) 200 MG tablet Take 600 mg by mouth every 6 (six) hours as needed for headache or moderate pain.     Historical Provider, MD  naproxen (NAPROSYN) 500 MG tablet Take 1 tablet (500 mg total) by mouth 2 (two) times daily. 04/18/15   Hope Bunnie Pion, NP   Pulse 67  Temp(Src) 99 F (37.2 C) (Oral)  Resp 16  Ht 5\' 7"  (1.702 m)  Wt 58.968 kg  BMI 20.36 kg/m2  SpO2 98%  LMP 03/05/2016   Physical Exam  Constitutional: She is oriented to person, place, and time. She appears well-developed and well-nourished. No distress.  HENT:  Head: Normocephalic and atraumatic.  Mouth/Throat: Oropharynx is clear and moist.  Eyes: Conjunctivae and EOM are normal. Pupils are equal, round, and reactive to light.  Neck: Normal range of motion. Neck supple.  Cardiovascular: Normal rate, regular rhythm and normal heart sounds.   Pulmonary/Chest: Effort normal and breath sounds normal. No respiratory distress. She has no wheezes.  Abdominal: Soft. Bowel sounds are normal. There is no tenderness. There is no guarding.  Musculoskeletal: Normal range of motion.  Neurological: She is alert and oriented to person, place, and time. No cranial nerve deficit.  AAOx3, answering questions and following commands appropriately; equal strength UE and LE bilaterally; CN grossly intact; moves all extremities appropriately without ataxia; no focal neuro deficits or facial asymmetry appreciated  Skin: Skin is warm and dry. She is not diaphoretic.  Psychiatric: She has a normal mood and affect.  Nursing note and vitals reviewed.   ED Course  Procedures (including critical care time) Labs Review Labs Reviewed  BASIC METABOLIC PANEL - Abnormal; Notable for the following:    Sodium 134 (*)    All other components within normal limits  URINALYSIS, ROUTINE W REFLEX MICROSCOPIC (NOT AT [facility]) - Abnormal; Notable for the following:    APPearance CLOUDY (*)     All other components within normal limits  CBC  D-DIMER, QUANTITATIVE (NOT AT [facility])  TROPONIN I  CBG MONITORING, ED  I-STAT TROPOININ, ED  I-STAT BETA HCG BLOOD, ED ([facility], [facility], [facility] ONLY)    Imaging Review Dg Chest 2 View  04/13/2016  CLINICAL DATA:  Syncope, chest tightness EXAM: CHEST  2 VIEW COMPARISON:  03/13/2015 FINDINGS: The heart size and mediastinal contours are within normal limits. Both lungs are clear. The visualized skeletal structures are unremarkable. IMPRESSION: No active cardiopulmonary disease. Electronically Signed   By: Kathreen Devoid   On: 04/13/2016 18:55   I have personally reviewed and evaluated these images and lab results as part of my medical decision-making.   EKG Interpretation   Date/Time:  Sunday Apr 13 2016 17:31:33 EDT Ventricular Rate:  68 PR Interval:  139 QRS Duration: 90 QT Interval:  393 QTC Calculation: 418 R Axis:   77 Text Interpretation:  Sinus rhythm RSR' in V1 or V2, probably  normal  variant LVH by voltage Borderline T abnormalities, anterior leads  Confirmed by Hazle Coca [phone]) on 04/13/2016 6:37:30 PM      MDM   Final diagnoses:  Syncope and collapse   35 year old female here after syncopal episode at work. She has history of syncope but has never been evaluated for this in the past. Patient is awake, alert, fully oriented to her baseline on arrival. She has no focal neurologic deficits. She does report a prodrome prior to her syncopal event, suspect vasovagal.  Does not sound clinically like seizure. She does report some chest pain afterwards in her midsternal region. Her EKG with likely early repolarization. Her lab work including troponin and d-dimer are negative. She has no noted electrolyte imbalance. Her chest x-ray is clear.  Patient has low risk HEART score of 1, PERC negative.  Low suspicion for acute cardiac or neurologic etiology of her syncope.  Patient has remained at her baseline here, states she feels fine now and would like to  go home.  Given negative work-up here today, feel this is reasonable.  She will follow-up with her PCP later this week and discuss this ED visit.  Discussed plan with patient, he/she acknowledged understanding and agreed with plan of care.  Return precautions given for new or worsening symptoms.  Larene Pickett, PA-C 04/13/16 2113  Quintella Reichert, MD 04/14/16 2252

## 2016-04-13 NOTE — ED Notes (Addendum)
Per EMS- initial call was for chest pain. Patient was at work and passed out in front of her [employer] at [employer]. 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 Patient told EMS that she had a panic attack.

## 2016-04-15 ENCOUNTER — Telehealth (HOSPITAL_BASED_OUTPATIENT_CLINIC_OR_DEPARTMENT_OTHER): Payer: Self-pay | Admitting: Emergency Medicine

## 2016-07-02 ENCOUNTER — Encounter (HOSPITAL_COMMUNITY): Payer: Self-pay | Admitting: Emergency Medicine

## 2016-07-02 ENCOUNTER — Emergency Department (HOSPITAL_COMMUNITY)
Admission: EM | Admit: 2016-07-02 | Discharge: 2016-07-02 | Disposition: A | Discharge status: Home / Self Care | Payer: Medicaid Other | Attending: Emergency Medicine | Admitting: Emergency Medicine

## 2016-07-02 DIAGNOSIS — R1084 Generalized abdominal pain: Secondary | ICD-10-CM | POA: Diagnosis not present

## 2016-07-02 DIAGNOSIS — J45909 Unspecified asthma, uncomplicated: Secondary | ICD-10-CM | POA: Insufficient documentation

## 2016-07-02 DIAGNOSIS — K0889 Other specified disorders of teeth and supporting structures: Secondary | ICD-10-CM | POA: Diagnosis present

## 2016-07-02 DIAGNOSIS — F17210 Nicotine dependence, cigarettes, uncomplicated: Secondary | ICD-10-CM | POA: Insufficient documentation

## 2016-07-02 DIAGNOSIS — G8929 Other chronic pain: Secondary | ICD-10-CM | POA: Diagnosis not present

## 2016-07-02 DIAGNOSIS — Z91040 Latex allergy status: Secondary | ICD-10-CM | POA: Insufficient documentation

## 2016-07-02 LAB — POC URINE PREG, ED: PREG TEST UR: NEGATIVE

## 2016-07-02 MED ORDER — HYDROCODONE-ACETAMINOPHEN 5-325 MG PO TABS: 2.0000 | ORAL_TABLET | ORAL | 0 refills | Status: DC | PRN

## 2016-07-02 MED ORDER — PENICILLIN V POTASSIUM 500 MG PO TABS: 500.0000 mg | ORAL_TABLET | Freq: Four times a day (QID) | ORAL | 0 refills | Status: AC

## 2016-07-02 MED ORDER — PENICILLIN V POTASSIUM 250 MG PO TABS
500.0000 mg | ORAL_TABLET | Freq: Once | ORAL | Status: AC
  Administered 2016-07-02: 500 mg via ORAL
  Filled 2016-07-02: qty 2

## 2016-07-02 MED ORDER — HYDROCODONE-ACETAMINOPHEN 5-325 MG PO TABS
1.0000 | ORAL_TABLET | Freq: Once | ORAL | Status: AC
  Administered 2016-07-02: 1 via ORAL
  Filled 2016-07-02: qty 1

## 2016-07-02 NOTE — ED Triage Notes (Signed)
Pt from home with c/o right lower back dental pain.  Reports a tooth broke off and now has pain and swelling.  NAD, A&O.

## 2016-07-02 NOTE — Discharge Instructions (Signed)
Medications: Penicillin, Norco  Treatment: Take penicillin 4 times daily for 7 days. Make sure to finish all of these pills. Take 1-2 tablets of Norco every 4 hours as needed for severe pain. Take over-the-counter ibuprofen for mild to moderate pain.  Follow-up: Please follow-up with a dentist as soon as possible for further evaluation and treatment. You can keep your appointment at your dentist or try calling any of the resources attached to your discharge paperwork. Please follow-up and establish care with a primary care provider and OB/GYN by calling the number circled on your discharge paperwork. Please return to the emergency department if you develop any new or worsening symptoms.

## 2016-07-02 NOTE — ED Provider Notes (Signed)
[facility] DEPT Provider Note  By signing my name below, I, Mesha Guinyard, attest that this documentation has been prepared under the direction and in the presence of Treatment Team:  Attending Provider: Blanchie Dessert, MD Physician Assistant: Frederica Kuster, PA-C.  Electronically Signed: Verlee Monte, Medical Scribe. 07/02/16. 11:21 AM.  CSN: [id number] Arrival date & time: 07/02/16  1016  First Provider Contact:  None    History   Chief Complaint Chief Complaint  Patient presents with  . Dental Pain    HPI Comments: Cathy [name] is a 35 y.o. female who presents to the Emergency Department complaining of constant dental pain onset 5 days ago. Pt states it broke 5 days ago that prevents her from chewing on her right side. She states that she is having associated symptoms of blood draining from the site, neck pain, chin tightness, episodic SOB with pain, and nausea that started 4 days ago that's now resolved. She states that she has tried 1 amoxicillin abx with no relief for her symptoms. Pt's dentist is Production designer, theatre/television/film and her appointment is in 2 weeks. She denies fever, chills, facial swelling, sore throat, chest pain, abdominal pain, vomiting, dysuria, vaginal discharge, back pain, rash, wound, HA and any other symptoms.   The history is provided by the patient. No language interpreter was used.    Past Medical History:  Diagnosis Date  . Asthma   . Bipolar depression ([place])   . Depression   . Gonorrhea   . Pelvic inflammatory disease (PID)   . Trichomonas infection     Patient Active Problem List   Diagnosis Date Noted  . MDD (major depressive disorder) ([place]) 10/11/2015  . Prenatal care insufficient 09/22/2011  . Large for dates pregnancy 09/22/2011  . Asthma 09/22/2011    Past Surgical History:  Procedure Laterality Date  . NO PAST SURGERIES      OB History    Gravida Para Term Preterm AB Living   5 4 4  0 1 4   SAB TAB Ectopic Multiple Live Births     1 0 0 0 1       Home Medications    Prior to Admission medications   Medication Sig Start Date End Date Taking? Authorizing Provider  albuterol (PROVENTIL HFA;VENTOLIN HFA) 108 (90 BASE) MCG/ACT inhaler Inhale 2 puffs into the lungs every 4 (four) hours as needed for wheezing or shortness of breath. 05/01/14   Orpah Greek, MD  albuterol (PROVENTIL) (2.5 MG/3ML) 0.083% nebulizer solution Take 3 mLs (2.5 mg total) by nebulization every 4 (four) hours as needed for wheezing or shortness of breath. 05/01/14   Orpah Greek, MD  HYDROcodone-acetaminophen (NORCO/VICODIN) 5-325 MG tablet Take 2 tablets by mouth every 4 (four) hours as needed. 07/02/16   Frederica Kuster, PA-C  ibuprofen (ADVIL,MOTRIN) 200 MG tablet Take 600 mg by mouth every 6 (six) hours as needed for headache or moderate pain.     Historical Provider, MD  Multiple Vitamins-Minerals (MULTI ADULT GUMMIES PO) Take 2 tablets by mouth daily.    Historical Provider, MD  penicillin v potassium (VEETID) 500 MG tablet Take 1 tablet (500 mg total) by mouth 4 (four) times daily. 07/02/16 07/09/16  Frederica Kuster, PA-C    Family History Family History  Problem Relation Age of Onset  . Diabetes Maternal Grandmother   . Hypertension Maternal Grandmother   . Diabetes Maternal Grandfather   . Hypertension Maternal Grandfather   . Diabetes Paternal Grandmother   .  Hypertension Paternal Grandmother   . Diabetes Paternal Grandfather   . Hypertension Paternal Grandfather     Social History Social History  Substance Use Topics  . Smoking status: Current Some Day Smoker    Packs/day: 0.50    Types: Cigarettes  . Smokeless tobacco: Never Used  . Alcohol use No     Allergies   Latex   Review of Systems Review of Systems  Constitutional: Negative for chills and fever.  HENT: Positive for dental problem. Negative for facial swelling and sore throat.   Respiratory: Positive for shortness of breath (episodic with dental  pain).   Cardiovascular: Negative for chest pain.  Gastrointestinal: Positive for abdominal pain (chronic) and nausea (has resolved). Negative for vomiting.  Genitourinary: Negative for dysuria.  Musculoskeletal: Positive for neck pain. Negative for back pain.  Skin: Negative for rash and wound.  Neurological: Negative for headaches.  Psychiatric/Behavioral: The patient is not nervous/anxious.    Physical Exam Updated Vital Signs BP 150/84 (BP Location: Right Arm)   Pulse 113   Temp 98.1 F (36.7 C) (Oral)   Resp 16   SpO2 98%   Physical Exam  Constitutional: She appears well-developed and well-nourished. No distress.  HENT:  Head: Normocephalic and atraumatic.  Mouth/Throat: Dental caries present. No dental abscesses.    Eyes: Conjunctivae are normal. Pupils are equal, round, and reactive to light. Right eye exhibits no discharge. Left eye exhibits no discharge. No scleral icterus.  Neck: Normal range of motion and full passive range of motion without pain. Neck supple. No thyromegaly present.  No masses felt; tenderness to R side jaw and submandibular region  Cardiovascular: Normal rate, regular rhythm and normal heart sounds.  Exam reveals no gallop and no friction rub.   No murmur heard. Pulmonary/Chest: Effort normal and breath sounds normal. No stridor. No respiratory distress. She has no wheezes. She has no rales.  Abdominal: Soft. Bowel sounds are normal. She exhibits no distension. There is generalized tenderness. There is no rebound and no guarding.  Patient reports chronic generalized abdominal pain and tenderness for years  Musculoskeletal: She exhibits no edema.  Lymphadenopathy:    She has no cervical adenopathy.  Neurological: She is alert. Coordination normal.  Skin: Skin is warm and dry. No rash noted. She is not diaphoretic. No pallor.  Psychiatric: She has a normal mood and affect.  Nursing note and vitals reviewed.    ED Treatments / Results  Labs (all  labs ordered are listed, but only abnormal results are displayed) Labs Reviewed  POC URINE PREG, ED    DIAGNOSTIC STUDIES: Oxygen Saturation is 98% on RA, nl by my interpretation.    COORDINATION OF CARE: 12:03 PM Discussed treatment plan with pt at bedside and pt agreed to plan.  EKG  EKG Interpretation None       Radiology No results found.  Procedures Procedures (including critical care time)  Medications Ordered in ED Medications  HYDROcodone-acetaminophen (NORCO/VICODIN) 5-325 MG per tablet 1 tablet (1 tablet Oral Given 07/02/16 1203)  penicillin v potassium (VEETID) tablet 500 mg (500 mg Oral Given 07/02/16 1203)     Initial Impression / Assessment and Plan / ED Course  I have reviewed the triage vital signs and the nursing notes.  Pertinent labs & imaging results that were available during my care of the patient were reviewed by me and considered in my medical decision making (see chart for details).  Clinical Course    Patient given first dose of penicillin  and Norco in ED.  Final Clinical Impressions(s) / ED Diagnoses   Final diagnoses:  Pain, dental   Patient with dentalgia with fractured tooth greater than 24 hours ago.  No abscess requiring immediate incision and drainage.  Exam not concerning for Ludwig's angina or pharyngeal abscess.  Will treat with Penicillin, Norco. Pt instructed to follow-up with dentist- appointment in 2 weeks. I also gave patient further dental resources to explore earlier appointment times.  Discussed return precautions. Patient also advised to follow up and establish care with PCP for chronic complaints. Patient vitals stable throughout ED course and discharged in satisfactory condition. I suspect initial tachycardia due to pain.  New Prescriptions New Prescriptions   HYDROCODONE-ACETAMINOPHEN (NORCO/VICODIN) 5-325 MG TABLET    Take 2 tablets by mouth every 4 (four) hours as needed.   PENICILLIN V POTASSIUM (VEETID) 500 MG TABLET     Take 1 tablet (500 mg total) by mouth 4 (four) times daily.    I personally performed the services described in this documentation, which was scribed in my presence. The recorded information has been reviewed and is accurate.    Frederica Kuster, PA-C 07/02/16 1210    Blanchie Dessert, MD 07/03/16 [phone]

## 2016-09-01 ENCOUNTER — Emergency Department (HOSPITAL_COMMUNITY): Payer: Medicaid Other

## 2016-09-01 ENCOUNTER — Emergency Department (HOSPITAL_COMMUNITY)
Admission: EM | Admit: 2016-09-01 | Discharge: 2016-09-01 | Disposition: A | Discharge status: Home / Self Care | Payer: Medicaid Other | Attending: Emergency Medicine | Admitting: Emergency Medicine

## 2016-09-01 ENCOUNTER — Encounter (HOSPITAL_COMMUNITY): Payer: Self-pay

## 2016-09-01 DIAGNOSIS — Z5321 Procedure and treatment not carried out due to patient leaving prior to being seen by health care provider: Secondary | ICD-10-CM | POA: Diagnosis not present

## 2016-09-01 DIAGNOSIS — R55 Syncope and collapse: Secondary | ICD-10-CM | POA: Insufficient documentation

## 2016-09-01 HISTORY — DX: Anxiety disorder, unspecified: F41.9

## 2016-09-01 LAB — URINALYSIS, ROUTINE W REFLEX MICROSCOPIC
BILIRUBIN URINE: NEGATIVE
Glucose, UA: NEGATIVE mg/dL
KETONES UR: NEGATIVE mg/dL
LEUKOCYTES UA: NEGATIVE
NITRITE: NEGATIVE
PH: 6 (ref 5.0–8.0)
Protein, ur: NEGATIVE mg/dL
SPECIFIC GRAVITY, URINE: 1.026 (ref 1.005–1.030)

## 2016-09-01 LAB — CBG MONITORING, ED: GLUCOSE-CAPILLARY: 85 mg/dL (ref 65–99)

## 2016-09-01 LAB — URINE MICROSCOPIC-ADD ON
BACTERIA UA: NONE SEEN
SQUAMOUS EPITHELIAL / LPF: NONE SEEN

## 2016-09-01 LAB — BASIC METABOLIC PANEL
ANION GAP: 6 (ref 5–15)
BUN: 11 mg/dL (ref 6–20)
CALCIUM: 8.9 mg/dL (ref 8.9–10.3)
CO2: 23 mmol/L (ref 22–32)
Chloride: 108 mmol/L (ref 101–111)
Creatinine, Ser: 0.86 mg/dL (ref 0.44–1.00)
GLUCOSE: 79 mg/dL (ref 65–99)
POTASSIUM: 3.1 mmol/L — AB (ref 3.5–5.1)
SODIUM: 137 mmol/L (ref 135–145)

## 2016-09-01 LAB — CBC
HEMATOCRIT: 35.6 % — AB (ref 36.0–46.0)
HEMOGLOBIN: 12.2 g/dL (ref 12.0–15.0)
MCH: 29.7 pg (ref 26.0–34.0)
MCHC: 34.3 g/dL (ref 30.0–36.0)
MCV: 86.6 fL (ref 78.0–100.0)
Platelets: 209 10*3/uL (ref 150–400)
RBC: 4.11 MIL/uL (ref 3.87–5.11)
RDW: 13.2 % (ref 11.5–15.5)
WBC: 6.7 10*3/uL (ref 4.0–10.5)

## 2016-09-01 LAB — HCG, QUANTITATIVE, PREGNANCY

## 2016-09-01 NOTE — ED Notes (Signed)
Patient transported to CT 

## 2016-09-01 NOTE — ED Triage Notes (Signed)
PT RECEIVED FROM HER WORK [employer] C/O A SYNCOPAL EPISODE. PT STS SHE WAS SPEAKING WITH A CUSTOMER, WHEN SHE GOT A SUDDEN HEADACHE AND DIZZINESS. SHE WALKED AWAY FROM THE REGISTERED AND COLLAPSED. UPON FALLING, SHE HIT HER HEAD ON THE SIDE OF THE CABINET. UPON EMS'S ARRIVAL, THE PT WAS HAVING A PANIC ATTACK, IN WHICH SHE HAS A HX OF ANXIETY. PT ALSO C/O CHEST TIGHTNESS, AS WELL. PT GIVEN ZOFRAN 4MG  IV PTA.

## 2017-01-10 ENCOUNTER — Emergency Department (HOSPITAL_COMMUNITY)
Admission: EM | Admit: 2017-01-10 | Discharge: 2017-01-10 | Disposition: A | Discharge status: Home / Self Care | Payer: Medicaid Other | Attending: Emergency Medicine | Admitting: Emergency Medicine

## 2017-01-10 ENCOUNTER — Encounter (HOSPITAL_COMMUNITY): Payer: Self-pay | Admitting: Vascular Surgery

## 2017-01-10 DIAGNOSIS — Z91040 Latex allergy status: Secondary | ICD-10-CM | POA: Diagnosis not present

## 2017-01-10 DIAGNOSIS — J45909 Unspecified asthma, uncomplicated: Secondary | ICD-10-CM | POA: Insufficient documentation

## 2017-01-10 DIAGNOSIS — R52 Pain, unspecified: Secondary | ICD-10-CM | POA: Insufficient documentation

## 2017-01-10 DIAGNOSIS — R6889 Other general symptoms and signs: Secondary | ICD-10-CM

## 2017-01-10 DIAGNOSIS — F17210 Nicotine dependence, cigarettes, uncomplicated: Secondary | ICD-10-CM | POA: Diagnosis not present

## 2017-01-10 LAB — URINALYSIS, MICROSCOPIC (REFLEX)
BACTERIA UA: NONE SEEN
WBC UA: NONE SEEN WBC/hpf (ref 0–5)

## 2017-01-10 LAB — URINALYSIS, ROUTINE W REFLEX MICROSCOPIC
BILIRUBIN URINE: NEGATIVE
GLUCOSE, UA: NEGATIVE mg/dL
Ketones, ur: NEGATIVE mg/dL
Leukocytes, UA: NEGATIVE
Nitrite: NEGATIVE
PH: 7 (ref 5.0–8.0)
Protein, ur: NEGATIVE mg/dL
SPECIFIC GRAVITY, URINE: 1.02 (ref 1.005–1.030)

## 2017-01-10 LAB — COMPREHENSIVE METABOLIC PANEL
ALT: 23 U/L (ref 14–54)
AST: 21 U/L (ref 15–41)
Albumin: 3.3 g/dL — ABNORMAL LOW (ref 3.5–5.0)
Alkaline Phosphatase: 56 U/L (ref 38–126)
Anion gap: 7 (ref 5–15)
BUN: 10 mg/dL (ref 6–20)
CHLORIDE: 107 mmol/L (ref 101–111)
CO2: 24 mmol/L (ref 22–32)
CREATININE: 0.99 mg/dL (ref 0.44–1.00)
Calcium: 8.9 mg/dL (ref 8.9–10.3)
Glucose, Bld: 82 mg/dL (ref 65–99)
POTASSIUM: 3.8 mmol/L (ref 3.5–5.1)
Sodium: 138 mmol/L (ref 135–145)
Total Bilirubin: 0.3 mg/dL (ref 0.3–1.2)
Total Protein: 7.2 g/dL (ref 6.5–8.1)

## 2017-01-10 LAB — I-STAT CG4 LACTIC ACID, ED: LACTIC ACID, VENOUS: 1.37 mmol/L (ref 0.5–1.9)

## 2017-01-10 LAB — CBC WITH DIFFERENTIAL/PLATELET
Basophils Absolute: 0 10*3/uL (ref 0.0–0.1)
Basophils Relative: 0 %
EOS ABS: 0 10*3/uL (ref 0.0–0.7)
Eosinophils Relative: 1 %
HEMATOCRIT: 34.6 % — AB (ref 36.0–46.0)
HEMOGLOBIN: 11.7 g/dL — AB (ref 12.0–15.0)
LYMPHS ABS: 0.6 10*3/uL — AB (ref 0.7–4.0)
LYMPHS PCT: 10 %
MCH: 29.6 pg (ref 26.0–34.0)
MCHC: 33.8 g/dL (ref 30.0–36.0)
MCV: 87.6 fL (ref 78.0–100.0)
MONOS PCT: 4 %
Monocytes Absolute: 0.2 10*3/uL (ref 0.1–1.0)
NEUTROS PCT: 85 %
Neutro Abs: 5.1 10*3/uL (ref 1.7–7.7)
Platelets: 174 10*3/uL (ref 150–400)
RBC: 3.95 MIL/uL (ref 3.87–5.11)
RDW: 12.7 % (ref 11.5–15.5)
WBC: 6 10*3/uL (ref 4.0–10.5)

## 2017-01-10 LAB — I-STAT BETA HCG BLOOD, ED (MC, WL, AP ONLY)

## 2017-01-10 MED ORDER — OSELTAMIVIR PHOSPHATE 75 MG PO CAPS
75.0000 mg | ORAL_CAPSULE | Freq: Once | ORAL | Status: AC
  Administered 2017-01-10: 75 mg via ORAL
  Filled 2017-01-10: qty 1

## 2017-01-10 MED ORDER — OSELTAMIVIR PHOSPHATE 75 MG PO CAPS: 75.0000 mg | ORAL_CAPSULE | Freq: Two times a day (BID) | ORAL | 0 refills | Status: DC

## 2017-01-10 MED ORDER — IBUPROFEN 400 MG PO TABS
600.0000 mg | ORAL_TABLET | Freq: Once | ORAL | Status: AC
  Administered 2017-01-10: 600 mg via ORAL
  Filled 2017-01-10: qty 1

## 2017-01-10 MED ORDER — ACETAMINOPHEN 325 MG PO TABS
ORAL_TABLET | ORAL | Status: AC
  Filled 2017-01-10: qty 2

## 2017-01-10 MED ORDER — ACETAMINOPHEN 325 MG PO TABS
650.0000 mg | ORAL_TABLET | Freq: Once | ORAL | Status: AC
  Administered 2017-01-10: 650 mg via ORAL

## 2017-01-10 NOTE — ED Notes (Signed)
Pt moving around in the wheel chair while trying to get her BP

## 2017-01-10 NOTE — Discharge Instructions (Signed)
[facility] Tamiflu.  A regular basis twice a day for 5 days.  He also can take Tylenol and ibuprofen alternating every 3-4 hours for fever or body aches.  Please try to stay hydrated and rest

## 2017-01-10 NOTE — ED Provider Notes (Signed)
[facility] DEPT Provider Note   CSN: [id number] Arrival date & time: 01/10/17  2005     History   Chief Complaint Chief Complaint  Patient presents with  . Generalized Body Aches  . Headache    HPI Cathy [name] is a 36 y.o. female.  This a normally healthy 36 year old female who had acute onset of body aches, fever, myalgias, loose stools 2 days ago she taken intermittent doses of ibuprofen with mild relief of her symptoms      Past Medical History:  Diagnosis Date  . Anxiety   . Asthma   . Bipolar depression ([place])   . Depression   . Gonorrhea   . Pelvic inflammatory disease (PID)   . Trichomonas infection     Patient Active Problem List   Diagnosis Date Noted  . MDD (major depressive disorder) 10/11/2015  . Prenatal care insufficient 09/22/2011  . Large for dates pregnancy 09/22/2011  . Asthma 09/22/2011    Past Surgical History:  Procedure Laterality Date  . NO PAST SURGERIES      OB History    Gravida Para Term Preterm AB Living   5 4 4  0 1 4   SAB TAB Ectopic Multiple Live Births   1 0 0 0 1       Home Medications    Prior to Admission medications   Medication Sig Start Date End Date Taking? Authorizing Provider  albuterol (PROVENTIL HFA;VENTOLIN HFA) 108 (90 BASE) MCG/ACT inhaler Inhale 2 puffs into the lungs every 4 (four) hours as needed for wheezing or shortness of breath. 05/01/14   Orpah Greek, MD  albuterol (PROVENTIL) (2.5 MG/3ML) 0.083% nebulizer solution Take 3 mLs (2.5 mg total) by nebulization every 4 (four) hours as needed for wheezing or shortness of breath. 05/01/14   Orpah Greek, MD  HYDROcodone-acetaminophen (NORCO/VICODIN) 5-325 MG tablet Take 2 tablets by mouth every 4 (four) hours as needed. 07/02/16   Frederica Kuster, PA-C  ibuprofen (ADVIL,MOTRIN) 200 MG tablet Take 600 mg by mouth every 6 (six) hours as needed for headache or moderate pain.     Historical Provider, MD  Multiple Vitamins-Minerals (MULTI  ADULT GUMMIES PO) Take 2 tablets by mouth daily.    Historical Provider, MD  oseltamivir (TAMIFLU) 75 MG capsule Take 1 capsule (75 mg total) by mouth every 12 (twelve) hours. 01/10/17   Junius Creamer, NP    Family History Family History  Problem Relation Age of Onset  . Diabetes Maternal Grandmother   . Hypertension Maternal Grandmother   . Diabetes Maternal Grandfather   . Hypertension Maternal Grandfather   . Diabetes Paternal Grandmother   . Hypertension Paternal Grandmother   . Diabetes Paternal Grandfather   . Hypertension Paternal Grandfather     Social History Social History  Substance Use Topics  . Smoking status: Current Some Day Smoker    Packs/day: 0.50    Types: Cigarettes  . Smokeless tobacco: Never Used  . Alcohol use No     Allergies   Latex   Review of Systems Review of Systems  Constitutional: Positive for fever.  HENT: Positive for sore throat.   Eyes: Negative for visual disturbance.  Gastrointestinal: Positive for diarrhea and nausea.  Musculoskeletal: Positive for myalgias.  Neurological: Positive for headaches.     Physical Exam Updated Vital Signs BP 121/72   Pulse 67   Temp 99.7 F (37.6 C) (Oral)   Resp 16   SpO2 100%   Physical Exam  Constitutional:  She appears well-developed and well-nourished. No distress.  Eyes: Pupils are equal, round, and reactive to light.  Neck: Normal range of motion.  Cardiovascular: Regular rhythm.   Pulmonary/Chest: Effort normal.  Musculoskeletal: Normal range of motion.  Neurological: She is alert.  Skin: Skin is warm.  Nursing note and vitals reviewed.    ED Treatments / Results  Labs (all labs ordered are listed, but only abnormal results are displayed) Labs Reviewed  COMPREHENSIVE METABOLIC PANEL - Abnormal; Notable for the following:       Result Value   Albumin 3.3 (*)    All other components within normal limits  CBC WITH DIFFERENTIAL/PLATELET - Abnormal; Notable for the following:     Hemoglobin 11.7 (*)    HCT 34.6 (*)    Lymphs Abs 0.6 (*)    All other components within normal limits  URINALYSIS, ROUTINE W REFLEX MICROSCOPIC - Abnormal; Notable for the following:    APPearance HAZY (*)    Hgb urine dipstick SMALL (*)    All other components within normal limits  URINALYSIS, MICROSCOPIC (REFLEX) - Abnormal; Notable for the following:    Squamous Epithelial / LPF 6-30 (*)    All other components within normal limits  I-STAT CG4 LACTIC ACID, ED  I-STAT BETA HCG BLOOD, ED ([facility], [facility], [facility] ONLY)    EKG  EKG Interpretation None       Radiology No results found.  Procedures Procedures (including critical care time)  Medications Ordered in ED Medications  acetaminophen (TYLENOL) 325 MG tablet (not administered)  ibuprofen (ADVIL,MOTRIN) tablet 600 mg (not administered)  oseltamivir (TAMIFLU) capsule 75 mg (not administered)  acetaminophen (TYLENOL) tablet 650 mg (650 mg Oral Given 01/10/17 2014)     Initial Impression / Assessment and Plan / ED Course  I have reviewed the triage vital signs and the nursing notes.  Pertinent labs & imaging results that were available during my care of the patient were reviewed by me and considered in my medical decision making (see chart for details).      Will start patient on Tamiflu   Final Clinical Impressions(s) / ED Diagnoses   Final diagnoses:  Flu-like symptoms    New Prescriptions New Prescriptions   OSELTAMIVIR (TAMIFLU) 75 MG CAPSULE    Take 1 capsule (75 mg total) by mouth every 12 (twelve) hours.     Junius Creamer, NP 01/10/17 2159    Junius Creamer, NP 01/10/17 2207    Junius Creamer, NP 01/10/17 [place], MD 01/10/17 2257

## 2017-01-10 NOTE — ED Triage Notes (Signed)
Pt reports to the ED for eval of generalized body aches and HA. Onset last night she reports she took some Ibuprofen but this did not help her pain. Reports some associated nausea. Denies any active vomiting. She does have some diarrhea x 2 days. Has not had any sick contacts. Pt tearful.

## 2017-04-06 ENCOUNTER — Encounter (HOSPITAL_COMMUNITY): Payer: Self-pay | Admitting: Emergency Medicine

## 2017-04-06 ENCOUNTER — Ambulatory Visit (HOSPITAL_COMMUNITY)
Admission: EM | Admit: 2017-04-06 | Discharge: 2017-04-06 | Disposition: A | Discharge status: Home / Self Care | Payer: Medicaid Other | Attending: Internal Medicine | Admitting: Internal Medicine

## 2017-04-06 DIAGNOSIS — L509 Urticaria, unspecified: Secondary | ICD-10-CM

## 2017-04-06 MED ORDER — PREDNISONE 10 MG PO TABS: ORAL_TABLET | ORAL | 0 refills | Status: DC

## 2017-04-06 NOTE — ED Triage Notes (Signed)
Here for rash on bilateral arms and face onset 1 week  Denies new foods, hygiene products, meds  A&O x4.. NAD

## 2017-04-06 NOTE — Discharge Instructions (Signed)
Return if any problems.

## 2017-04-06 NOTE — ED Provider Notes (Signed)
CSN: [id number]     Arrival date & time 04/06/17  1018 History   None    Chief Complaint  Patient presents with  . Rash   (Consider location/radiation/quality/duration/timing/severity/associated sxs/prior Treatment) The history is provided by the patient. No language interpreter was used.  Rash  Location:  Full body Quality: redness and swelling   Severity:  Moderate Onset quality:  Gradual Timing:  Constant Progression:  Worsening Chronicity:  New Context: not hot tub use, not insect bite/sting, not medications, not new detergent/soap and not nuts   Relieved by:  Nothing Worsened by:  Nothing Ineffective treatments:  None tried Associated symptoms: no abdominal pain     Past Medical History:  Diagnosis Date  . Anxiety   . Asthma   . Bipolar depression ([place])   . Depression   . Gonorrhea   . Pelvic inflammatory disease (PID)   . Trichomonas infection    Past Surgical History:  Procedure Laterality Date  . NO PAST SURGERIES     Family History  Problem Relation Age of Onset  . Diabetes Maternal Grandmother   . Hypertension Maternal Grandmother   . Diabetes Maternal Grandfather   . Hypertension Maternal Grandfather   . Diabetes Paternal Grandmother   . Hypertension Paternal Grandmother   . Diabetes Paternal Grandfather   . Hypertension Paternal Grandfather    Social History  Substance Use Topics  . Smoking status: Current Some Day Smoker    Packs/day: 0.50    Types: Cigarettes  . Smokeless tobacco: Never Used  . Alcohol use No   OB History    Gravida Para Term Preterm AB Living   5 4 4  0 1 4   SAB TAB Ectopic Multiple Live Births   1 0 0 0 1     Review of Systems  Gastrointestinal: Negative for abdominal pain.  Skin: Positive for rash.  All other systems reviewed and are negative.   Allergies  Latex  Home Medications   Prior to Admission medications   Medication Sig Start Date End Date Taking? Authorizing Provider  albuterol (PROVENTIL  HFA;VENTOLIN HFA) 108 (90 BASE) MCG/ACT inhaler Inhale 2 puffs into the lungs every 4 (four) hours as needed for wheezing or shortness of breath. 05/01/14  Yes Pollina, Gwenyth Allegra, MD  albuterol (PROVENTIL) (2.5 MG/3ML) 0.083% nebulizer solution Take 3 mLs (2.5 mg total) by nebulization every 4 (four) hours as needed for wheezing or shortness of breath. 05/01/14  Yes Pollina, Gwenyth Allegra, MD  HYDROcodone-acetaminophen (NORCO/VICODIN) 5-325 MG tablet Take 2 tablets by mouth every 4 (four) hours as needed. 07/02/16   Law, Bea Graff, PA-C  ibuprofen (ADVIL,MOTRIN) 200 MG tablet Take 600 mg by mouth every 6 (six) hours as needed for headache or moderate pain.     [provider]  Multiple Vitamins-Minerals (MULTI ADULT GUMMIES PO) Take 2 tablets by mouth daily.    [provider]  oseltamivir (TAMIFLU) 75 MG capsule Take 1 capsule (75 mg total) by mouth every 12 (twelve) hours. 01/10/17   Junius Creamer, NP  predniSONE (DELTASONE) 10 MG tablet 2,8,3,6,6,2 taper 04/06/17   Fransico Meadow, PA-C   Meds Ordered and Administered this Visit  Medications - No data to display  BP 108/65 (BP Location: Left Arm)   Pulse 62   Temp 98.7 F (37.1 C) (Oral)   Resp 20   LMP 03/20/2017   SpO2 100%  No data found.   Physical Exam  Constitutional: She appears well-developed and well-nourished. No distress.  HENT:  Head: Normocephalic and atraumatic.  Eyes: Conjunctivae are normal.  Neck: Neck supple.  Cardiovascular: Normal rate and regular rhythm.   No murmur heard. Pulmonary/Chest: Effort normal and breath sounds normal. No respiratory distress.  Abdominal: There is no tenderness.  Musculoskeletal: She exhibits no edema.  Neurological: She is alert.  Skin: Skin is warm and dry.  Raised red areas, looks like hives  Psychiatric: She has a normal mood and affect.  Nursing note and vitals reviewed.   Urgent Care Course     Procedures (including critical care time)  Labs  Review Labs Reviewed - No data to display  Imaging Review No results found.   Visual Acuity Review  Right Eye Distance:   Left Eye Distance:   Bilateral Distance:    Right Eye Near:   Left Eye Near:    Bilateral Near:         MDM   1. Hives    An After Visit Summary was printed and given to the patient. Meds ordered this encounter  Medications  . predniSONE (DELTASONE) 10 MG tablet    Sig: 6,5,4,3,2,1 taper    Dispense:  21 tablet    Refill:  0    Order Specific Question:   Supervising Provider    Answer:   Sherlene Shams [[id number]]      Fransico Meadow, PA-C 04/06/17 1155

## 2017-07-15 ENCOUNTER — Encounter (HOSPITAL_COMMUNITY): Payer: Self-pay

## 2017-07-15 ENCOUNTER — Emergency Department (HOSPITAL_COMMUNITY)
Admission: EM | Admit: 2017-07-15 | Discharge: 2017-07-15 | Disposition: A | Discharge status: Home / Self Care | Payer: No Typology Code available for payment source | Attending: Emergency Medicine | Admitting: Emergency Medicine

## 2017-07-15 DIAGNOSIS — S29012A Strain of muscle and tendon of back wall of thorax, initial encounter: Secondary | ICD-10-CM | POA: Insufficient documentation

## 2017-07-15 DIAGNOSIS — Y92410 Unspecified street and highway as the place of occurrence of the external cause: Secondary | ICD-10-CM | POA: Insufficient documentation

## 2017-07-15 DIAGNOSIS — Y939 Activity, unspecified: Secondary | ICD-10-CM | POA: Diagnosis not present

## 2017-07-15 DIAGNOSIS — M546 Pain in thoracic spine: Secondary | ICD-10-CM

## 2017-07-15 DIAGNOSIS — Y999 Unspecified external cause status: Secondary | ICD-10-CM | POA: Insufficient documentation

## 2017-07-15 DIAGNOSIS — S4992XA Unspecified injury of left shoulder and upper arm, initial encounter: Secondary | ICD-10-CM | POA: Diagnosis present

## 2017-07-15 DIAGNOSIS — S46812A Strain of other muscles, fascia and tendons at shoulder and upper arm level, left arm, initial encounter: Secondary | ICD-10-CM

## 2017-07-15 DIAGNOSIS — F17210 Nicotine dependence, cigarettes, uncomplicated: Secondary | ICD-10-CM | POA: Diagnosis not present

## 2017-07-15 DIAGNOSIS — J45909 Unspecified asthma, uncomplicated: Secondary | ICD-10-CM | POA: Diagnosis not present

## 2017-07-15 MED ORDER — METHOCARBAMOL 500 MG PO TABS: 500.0000 mg | ORAL_TABLET | Freq: Two times a day (BID) | ORAL | 0 refills | Status: DC | PRN

## 2017-07-15 MED ORDER — NAPROXEN 250 MG PO TABS: 250.0000 mg | ORAL_TABLET | Freq: Two times a day (BID) | ORAL | 0 refills | Status: DC
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
## 2017-09-14 ENCOUNTER — Encounter (HOSPITAL_COMMUNITY): Payer: Self-pay | Admitting: Emergency Medicine

## 2017-09-14 ENCOUNTER — Emergency Department (HOSPITAL_COMMUNITY)
Admission: EM | Admit: 2017-09-14 | Discharge: 2017-09-14 | Disposition: A | Discharge status: Home / Self Care | Payer: Self-pay | Attending: Physician Assistant | Admitting: Physician Assistant

## 2017-09-14 DIAGNOSIS — Z91040 Latex allergy status: Secondary | ICD-10-CM | POA: Insufficient documentation

## 2017-09-14 DIAGNOSIS — K029 Dental caries, unspecified: Secondary | ICD-10-CM | POA: Insufficient documentation

## 2017-09-14 DIAGNOSIS — R600 Localized edema: Secondary | ICD-10-CM | POA: Insufficient documentation

## 2017-09-14 DIAGNOSIS — J45909 Unspecified asthma, uncomplicated: Secondary | ICD-10-CM | POA: Insufficient documentation

## 2017-09-14 DIAGNOSIS — K0889 Other specified disorders of teeth and supporting structures: Secondary | ICD-10-CM | POA: Insufficient documentation

## 2017-09-14 DIAGNOSIS — F17210 Nicotine dependence, cigarettes, uncomplicated: Secondary | ICD-10-CM | POA: Insufficient documentation

## 2017-09-14 DIAGNOSIS — R110 Nausea: Secondary | ICD-10-CM | POA: Insufficient documentation

## 2017-09-14 DIAGNOSIS — Z79899 Other long term (current) drug therapy: Secondary | ICD-10-CM | POA: Insufficient documentation

## 2017-09-14 MED ORDER — PENICILLIN V POTASSIUM 500 MG PO TABS: 500.0000 mg | ORAL_TABLET | Freq: Four times a day (QID) | ORAL | 0 refills | Status: AC

## 2017-09-14 NOTE — ED Notes (Signed)
Patient is alert and oriented x3.  She was given DC instructions and follow up visit instructions.  Patient gave verbal understanding. She was DC ambulatory under her own power to home.  V/S stable.  He was not showing any signs of distress on DC 

## 2017-09-14 NOTE — Discharge Instructions (Signed)
Please take Ibuprofen (Advil, motrin) and Tylenol (acetaminophen) to relieve your pain.  You may take up to 600 MG (3 pills) of normal strength ibuprofen every 8 hours as needed.  In between doses of ibuprofen you make take tylenol, up to 1,000 mg (two extra strength pills).  Do not take more than 3,000 mg tylenol in a 24 hour period.  Please check all medication labels as many medications such as pain and cold medications may contain tylenol.  Do not drink alcohol while taking these medications.  Do not take other NSAID'S while taking ibuprofen (such as aleve or naproxen).  Please take ibuprofen with food to decrease stomach upset.  You may have diarrhea from the antibiotics.  It is very important that you continue to take the antibiotics even if you get diarrhea unless a medical professional tells you that you may stop taking them.  If you stop too early the bacteria you are being treated for will become stronger and you may need different, more powerful antibiotics that have more side effects and worsening diarrhea.  Please stay well hydrated and consider probiotics as they may decrease the severity of your diarrhea.  Please be aware that if you take any hormonal contraception (birth control pills, nexplanon, the ring, etc) that your birth control will not work while you are taking antibiotics and you need to use back up protection as directed on the birth control medication information insert.

## 2017-09-14 NOTE — ED Triage Notes (Signed)
Pt complaint of right lower dental pain worsening over past 3 days.

## 2017-09-14 NOTE — ED Provider Notes (Signed)
[facility] DEPT Provider Note   CSN: [id number] Arrival date & time: 09/14/17  1256     History   Chief Complaint Chief Complaint  Patient presents with  . Dental Pain    HPI Cathy [name] is a 36 y.o. female who presents today for evaluation of 3 days of worsening dental pain. She reports that she has a broken tooth on the right lower side. She says that her pain has been gradually increasing. She does report some mild swelling. She says that she has been taking 1600mg  of ibuprofen and took half a Percocet for her pain. She does report some mild nausea that began after her ibuprofen use. She does not have a dentist. She reports her last tetanus shot was last year.  HPI  Past Medical History:  Diagnosis Date  . Anxiety   . Asthma   . Bipolar depression ([place])   . Depression   . Gonorrhea   . Pelvic inflammatory disease (PID)   . Trichomonas infection     Patient Active Problem List   Diagnosis Date Noted  . MDD (major depressive disorder) 10/11/2015  . Prenatal care insufficient 09/22/2011  . Large for dates pregnancy 09/22/2011  . Asthma 09/22/2011    Past Surgical History:  Procedure Laterality Date  . NO PAST SURGERIES      OB History    Gravida Para Term Preterm AB Living   5 4 4  0 1 4   SAB TAB Ectopic Multiple Live Births   1 0 0 0 1       Home Medications    Prior to Admission medications   Medication Sig Start Date End Date Taking? Authorizing Provider  albuterol (PROVENTIL HFA;VENTOLIN HFA) 108 (90 BASE) MCG/ACT inhaler Inhale 2 puffs into the lungs every 4 (four) hours as needed for wheezing or shortness of breath. 05/01/14   Orpah Greek, MD  albuterol (PROVENTIL) (2.5 MG/3ML) 0.083% nebulizer solution Take 3 mLs (2.5 mg total) by nebulization every 4 (four) hours as needed for wheezing or shortness of breath. 05/01/14   Orpah Greek, MD  methocarbamol (ROBAXIN) 500 MG tablet Take 1 tablet (500 mg  total) by mouth 2 (two) times daily as needed for muscle spasms. 07/15/17   Waynetta Pean, PA-C  Multiple Vitamins-Minerals (MULTI ADULT GUMMIES PO) Take 2 tablets by mouth daily.    [provider]  naproxen (NAPROSYN) 250 MG tablet Take 1 tablet (250 mg total) by mouth 2 (two) times daily with a meal. 07/15/17   Waynetta Pean, PA-C  penicillin v potassium (VEETID) 500 MG tablet Take 1 tablet (500 mg total) by mouth 4 (four) times daily. 09/14/17 09/21/17  Lorin Glass, PA-C    Family History Family History  Problem Relation Age of Onset  . Diabetes Maternal Grandmother   . Hypertension Maternal Grandmother   . Diabetes Maternal Grandfather   . Hypertension Maternal Grandfather   . Diabetes Paternal Grandmother   . Hypertension Paternal Grandmother   . Diabetes Paternal Grandfather   . Hypertension Paternal Grandfather     Social History Social History  Substance Use Topics  . Smoking status: Current Some Day Smoker    Packs/day: 0.50    Types: Cigarettes  . Smokeless tobacco: Never Used  . Alcohol use No     Allergies   Latex   Review of Systems Review of Systems  Constitutional: Positive for appetite change (Secondary to nausea) and chills. Negative for fever.  HENT: Positive  for dental problem, ear pain (Right ear) and facial swelling. Negative for congestion and drooling.   Gastrointestinal: Positive for nausea. Negative for abdominal pain and vomiting.     Physical Exam Updated Vital Signs BP 105/68 (BP Location: Right Arm)   Pulse 65   Temp 98.1 F (36.7 C) (Oral)   Resp 16   SpO2 100%   Physical Exam  Constitutional: She appears well-developed and well-nourished.  HENT:  Head: Normocephalic and atraumatic.  Right Ear: External ear normal.  Left Ear: External ear normal.  Mouth/Throat: Uvula is midline, oropharynx is clear and moist and mucous membranes are normal. Abnormal dentition. Dental caries present. No dental abscesses. No  tonsillar exudate.  Right lower second or third molar is broken with obvious caries. There are missing teeth.  There is no obvious fluctuance, mild area of swelling on right inferior buccal mucosa.    Neck: Normal range of motion. Neck supple. No tracheal deviation present.  Cardiovascular: Normal rate.   Pulmonary/Chest: Effort normal. No stridor. No respiratory distress.  Lymphadenopathy:    She has no cervical adenopathy.  Neurological: She is alert.  Skin: Skin is warm and dry. She is not diaphoretic.  Nursing note and vitals reviewed.    ED Treatments / Results  Labs (all labs ordered are listed, but only abnormal results are displayed) Labs Reviewed - No data to display  EKG  EKG Interpretation None       Radiology No results found.  Procedures Procedures (including critical care time)  Medications Ordered in ED Medications - No data to display   Initial Impression / Assessment and Plan / ED Course  I have reviewed the triage vital signs and the nursing notes.  Pertinent labs & imaging results that were available during my care of the patient were reviewed by me and considered in my medical decision making (see chart for details).    Patient with toothache.  No gross abscess.  Exam unconcerning for Ludwig's angina or spread of infection.  Will treat with penicillin and pain medicine.  Urged patient to follow-up with dentist.     Final Clinical Impressions(s) / ED Diagnoses   Final diagnoses:  Pain, dental    New Prescriptions Discharge Medication List as of 09/14/2017  1:42 PM    START taking these medications   Details  penicillin v potassium (VEETID) 500 MG tablet Take 1 tablet (500 mg total) by mouth 4 (four) times daily., Starting Mon 09/14/2017, Until Mon 09/21/2017, Print         Lorin Glass, PA-C 09/14/17 1456    Macarthur Critchley, MD 09/15/17 [phone]

## 2017-10-25 ENCOUNTER — Emergency Department (HOSPITAL_COMMUNITY)
Admission: EM | Admit: 2017-10-25 | Discharge: 2017-10-25 | Disposition: A | Discharge status: Home / Self Care | Payer: Medicaid Other | Attending: Emergency Medicine | Admitting: Emergency Medicine

## 2017-10-25 ENCOUNTER — Encounter (HOSPITAL_COMMUNITY): Payer: Self-pay | Admitting: Emergency Medicine

## 2017-10-25 DIAGNOSIS — R05 Cough: Secondary | ICD-10-CM | POA: Insufficient documentation

## 2017-10-25 DIAGNOSIS — R0981 Nasal congestion: Secondary | ICD-10-CM | POA: Insufficient documentation

## 2017-10-25 DIAGNOSIS — J45909 Unspecified asthma, uncomplicated: Secondary | ICD-10-CM | POA: Insufficient documentation

## 2017-10-25 DIAGNOSIS — F17210 Nicotine dependence, cigarettes, uncomplicated: Secondary | ICD-10-CM | POA: Insufficient documentation

## 2017-10-25 DIAGNOSIS — Z79899 Other long term (current) drug therapy: Secondary | ICD-10-CM | POA: Insufficient documentation

## 2017-10-25 DIAGNOSIS — H109 Unspecified conjunctivitis: Secondary | ICD-10-CM

## 2017-10-25 DIAGNOSIS — H1089 Other conjunctivitis: Secondary | ICD-10-CM | POA: Insufficient documentation

## 2017-10-25 MED ORDER — TETRACAINE HCL 0.5 % OP SOLN
1.0000 [drp] | Freq: Once | OPHTHALMIC | Status: AC
  Administered 2017-10-25: 1 [drp] via OPHTHALMIC
  Filled 2017-10-25: qty 4

## 2017-10-25 MED ORDER — FLUORESCEIN SODIUM 1 MG OP STRP
1.0000 | ORAL_STRIP | Freq: Once | OPHTHALMIC | Status: AC
  Administered 2017-10-25: 1 via OPHTHALMIC
  Filled 2017-10-25: qty 1

## 2017-10-25 MED ORDER — SULFACETAMIDE SODIUM 10 % OP SOLN: 1.0000 [drp] | OPHTHALMIC | 0 refills | Status: DC

## 2017-10-25 NOTE — ED Provider Notes (Signed)
[facility] DEPT Provider Note   CSN: [id number] Arrival date & time: 10/25/17  1351     History   Chief Complaint Chief Complaint  Patient presents with  . Conjunctivitis    HPI Cathy [name] is a 36 y.o. female who presents to ED for evaluation of 2-day history of right eye purulent drainage and redness.  States that the symptoms began when she woke up with her eyes matted together.  She does endorse a foreign body sensation in the right eye.  She also reports other URI symptoms including congestion and cough.  She has tried over-the-counter Visine drops with no relief in her symptoms.  She denies any photophobia, pain with eye movements, headache, vision changes, injury or trauma to the area.  She does not wear contact lenses.  HPI  Past Medical History:  Diagnosis Date  . Anxiety   . Asthma   . Bipolar depression ([place])   . Depression   . Gonorrhea   . Pelvic inflammatory disease (PID)   . Trichomonas infection     Patient Active Problem List   Diagnosis Date Noted  . MDD (major depressive disorder) 10/11/2015  . Prenatal care insufficient 09/22/2011  . Large for dates pregnancy 09/22/2011  . Asthma 09/22/2011    Past Surgical History:  Procedure Laterality Date  . NO PAST SURGERIES      OB History    Gravida Para Term Preterm AB Living   5 4 4  0 1 4   SAB TAB Ectopic Multiple Live Births   1 0 0 0 1       Home Medications    Prior to Admission medications   Medication Sig Start Date End Date Taking? Authorizing Provider  albuterol (PROVENTIL HFA;VENTOLIN HFA) 108 (90 BASE) MCG/ACT inhaler Inhale 2 puffs into the lungs every 4 (four) hours as needed for wheezing or shortness of breath. 05/01/14   Orpah Greek, MD  albuterol (PROVENTIL) (2.5 MG/3ML) 0.083% nebulizer solution Take 3 mLs (2.5 mg total) by nebulization every 4 (four) hours as needed for wheezing or shortness of breath. 05/01/14   Orpah Greek,  MD  methocarbamol (ROBAXIN) 500 MG tablet Take 1 tablet (500 mg total) by mouth 2 (two) times daily as needed for muscle spasms. 07/15/17   Waynetta Pean, PA-C  Multiple Vitamins-Minerals (MULTI ADULT GUMMIES PO) Take 2 tablets by mouth daily.    [provider]  naproxen (NAPROSYN) 250 MG tablet Take 1 tablet (250 mg total) by mouth 2 (two) times daily with a meal. 07/15/17   Waynetta Pean, PA-C  sulfacetamide (BLEPH-10) 10 % ophthalmic solution Place 1-2 drops into the right eye every 4 (four) hours. 10/25/17   Delia Heady, PA-C    Family History Family History  Problem Relation Age of Onset  . Diabetes Maternal Grandmother   . Hypertension Maternal Grandmother   . Diabetes Maternal Grandfather   . Hypertension Maternal Grandfather   . Diabetes Paternal Grandmother   . Hypertension Paternal Grandmother   . Diabetes Paternal Grandfather   . Hypertension Paternal Grandfather     Social History Social History   Tobacco Use  . Smoking status: Current Some Day Smoker    Packs/day: 0.50    Types: Cigarettes  . Smokeless tobacco: Never Used  Substance Use Topics  . Alcohol use: No  . Drug use: Yes    Types: Marijuana    Comment: 1-2 days a week     Allergies   Latex  Review of Systems Review of Systems  Constitutional: Negative for chills and fever.  HENT: Positive for congestion. Negative for ear pain, facial swelling, rhinorrhea, sinus pressure, sinus pain, sneezing and sore throat.   Eyes: Positive for discharge and redness. Negative for photophobia, pain, itching and visual disturbance.  Respiratory: Positive for cough.   Skin: Negative for color change and rash.  Neurological: Negative for headaches.     Physical Exam Updated Vital Signs BP 112/75 (BP Location: Right Arm)   Pulse 80   Temp 97.9 F (36.6 C) (Oral)   Resp 18   LMP 10/05/2017   SpO2 100%   Physical Exam  Constitutional: She appears well-developed and well-nourished. No distress.   HENT:  Head: Normocephalic and atraumatic.  Eyes: EOM are normal. Pupils are equal, round, and reactive to light. Right eye exhibits discharge. Right conjunctiva is injected. Right conjunctiva has no hemorrhage. Left conjunctiva is not injected. Left conjunctiva has no hemorrhage. No scleral icterus.  Right eye with injected conjunctiva, no eyelid swelling or erythema or tenderness to palpation.  Mild clear tearful drainage noted.  No foreign bodies noted.  No pain with EOMs.  No chemosis, proptosis, or consensual photophobia.  Fluorescein stain with no corneal abrasions, foreign bodies, dendritic lesions, ulcerations, negative Sidel sign.  Neck: Normal range of motion.  Cardiovascular: Normal rate, regular rhythm and normal heart sounds.  Pulmonary/Chest: Effort normal and breath sounds normal. No respiratory distress.  Neurological: She is alert.  Skin: No rash noted. She is not diaphoretic.  Psychiatric: She has a normal mood and affect.  Nursing note and vitals reviewed.    ED Treatments / Results  Labs (all labs ordered are listed, but only abnormal results are displayed) Labs Reviewed - No data to display  EKG  EKG Interpretation None       Radiology No results found.  Procedures Procedures (including critical care time)  Medications Ordered in ED Medications  fluorescein ophthalmic strip 1 strip (not administered)  tetracaine (PONTOCAINE) 0.5 % ophthalmic solution 1 drop (not administered)     Initial Impression / Assessment and Plan / ED Course  I have reviewed the triage vital signs and the nursing notes.  Pertinent labs & imaging results that were available during my care of the patient were reviewed by me and considered in my medical decision making (see chart for details).     Patient presents to ED for evaluation of right eye redness and purulent drainage for the past 2 days.  She states that she woke up initially with her eyelids matted together.  No  sick contacts with similar symptoms.  She has tried over-the-counter Visine drops with no improvement in her symptoms.  She denies any photophobia, injury, trauma, vision changes or pain with eye movements.  He does endorse a foreign body sensation.  No signs of corneal abrasion, ulceration, dendritic lesions on fluorescein stain.  She does have purulent discharge noted.  Low suspicion for iritis, HSV.  Symptoms most likely due to conjunctivitis.  Will give eyedrops and advised follow-up with ophthalmology for further evaluation.  Patient appears stable for discharge at this time.  Strict return precautions given.  Final Clinical Impressions(s) / ED Diagnoses   Final diagnoses:  Bacterial conjunctivitis of right eye    ED Discharge Orders        Ordered    sulfacetamide (BLEPH-10) 10 % ophthalmic solution  Every 4 hours     10/25/17 1631       Hills, [place],  PA-C 10/25/17 1636    Fatima Blank, MD 10/25/17 [phone]

## 2017-10-25 NOTE — Discharge Instructions (Signed)
Please read the attached information regarding your condition. Use eyedrops in affected eye as directed. Please ensure adequate hygiene with handwashing. Return to ED for worsening symptoms including increased drainage, vision changes, pain with eye movements, injuries or traumas.

## 2017-10-25 NOTE — ED Triage Notes (Signed)
Patient presents with right eye redness and irritation on Friday.

## 2018-01-04 ENCOUNTER — Emergency Department (HOSPITAL_COMMUNITY)
Admission: EM | Admit: 2018-01-04 | Discharge: 2018-01-04 | Disposition: A | Discharge status: Home / Self Care | Payer: Medicaid Other | Attending: Emergency Medicine | Admitting: Emergency Medicine

## 2018-01-04 ENCOUNTER — Other Ambulatory Visit: Payer: Self-pay

## 2018-01-04 ENCOUNTER — Emergency Department (HOSPITAL_COMMUNITY): Payer: Medicaid Other

## 2018-01-04 ENCOUNTER — Encounter (HOSPITAL_COMMUNITY): Payer: Self-pay | Admitting: *Deleted

## 2018-01-04 DIAGNOSIS — F17210 Nicotine dependence, cigarettes, uncomplicated: Secondary | ICD-10-CM | POA: Insufficient documentation

## 2018-01-04 DIAGNOSIS — J45909 Unspecified asthma, uncomplicated: Secondary | ICD-10-CM | POA: Insufficient documentation

## 2018-01-04 DIAGNOSIS — G5601 Carpal tunnel syndrome, right upper limb: Secondary | ICD-10-CM

## 2018-01-04 DIAGNOSIS — Z79899 Other long term (current) drug therapy: Secondary | ICD-10-CM | POA: Insufficient documentation

## 2018-01-04 DIAGNOSIS — Z91040 Latex allergy status: Secondary | ICD-10-CM | POA: Insufficient documentation

## 2018-01-04 NOTE — ED Provider Notes (Signed)
[facility] EMERGENCY DEPARTMENT Provider Note   CSN: [id number] Arrival date & time: 01/04/18  1342     History   Chief Complaint Chief Complaint  Patient presents with  . Wrist Pain    HPI Cathy [name] is a 37 y.o. female.  HPI   37 year old female presents to the complaints of right wrist and hand pain.  Patient notes she started working at [employer] 1 year ago.  Since that time she is developed pain in the wrist and hand and has been diagnosed with carpal tunnel.  She notes she is having worsening pain over the last day along the volar aspect of the wrist.  She denies any loss of sensation strength and motor function but notes that range of motion of the wrist causes discomfort.  She denies any fever or trauma but does note that she has been consistently working.    Past Medical History:  Diagnosis Date  . Anxiety   . Asthma   . Bipolar depression ([place])   . Depression   . Gonorrhea   . Pelvic inflammatory disease (PID)   . Trichomonas infection     Patient Active Problem List   Diagnosis Date Noted  . MDD (major depressive disorder) 10/11/2015  . Prenatal care insufficient 09/22/2011  . Large for dates pregnancy 09/22/2011  . Asthma 09/22/2011    Past Surgical History:  Procedure Laterality Date  . NO PAST SURGERIES      OB History    Gravida Para Term Preterm AB Living   5 4 4  0 1 4   SAB TAB Ectopic Multiple Live Births   1 0 0 0 1       Home Medications    Prior to Admission medications   Medication Sig Start Date End Date Taking? Authorizing Provider  albuterol (PROVENTIL HFA;VENTOLIN HFA) 108 (90 BASE) MCG/ACT inhaler Inhale 2 puffs into the lungs every 4 (four) hours as needed for wheezing or shortness of breath. 05/01/14   Orpah Greek, MD  albuterol (PROVENTIL) (2.5 MG/3ML) 0.083% nebulizer solution Take 3 mLs (2.5 mg total) by nebulization every 4 (four) hours as needed for wheezing or shortness of breath. 05/01/14    Orpah Greek, MD  methocarbamol (ROBAXIN) 500 MG tablet Take 1 tablet (500 mg total) by mouth 2 (two) times daily as needed for muscle spasms. 07/15/17   Waynetta Pean, PA-C  Multiple Vitamins-Minerals (MULTI ADULT GUMMIES PO) Take 2 tablets by mouth daily.    [provider]  naproxen (NAPROSYN) 250 MG tablet Take 1 tablet (250 mg total) by mouth 2 (two) times daily with a meal. 07/15/17   Waynetta Pean, PA-C  sulfacetamide (BLEPH-10) 10 % ophthalmic solution Place 1-2 drops into the right eye every 4 (four) hours. 10/25/17   Delia Heady, PA-C    Family History Family History  Problem Relation Age of Onset  . Diabetes Maternal Grandmother   . Hypertension Maternal Grandmother   . Diabetes Maternal Grandfather   . Hypertension Maternal Grandfather   . Diabetes Paternal Grandmother   . Hypertension Paternal Grandmother   . Diabetes Paternal Grandfather   . Hypertension Paternal Grandfather     Social History Social History   Tobacco Use  . Smoking status: Current Some Day Smoker    Packs/day: 0.50    Types: Cigarettes  . Smokeless tobacco: Never Used  Substance Use Topics  . Alcohol use: No  . Drug use: Yes    Types: Marijuana  Comment: 1-2 days a week     Allergies   Latex   Review of Systems Review of Systems  All other systems reviewed and are negative.    Physical Exam Updated Vital Signs BP 126/76 (BP Location: Right Arm)   Pulse (!) 58   Temp 98.5 F (36.9 C) (Oral)   Resp 16   Ht 5\' 7"  (1.702 m)   Wt 61.2 kg (135 lb)   LMP 12/15/2017   SpO2 100%   BMI 21.14 kg/m   Physical Exam  Constitutional: She is oriented to person, place, and time. She appears well-developed and well-nourished.  HENT:  Head: Normocephalic and atraumatic.  Eyes: Conjunctivae are normal. Pupils are equal, round, and reactive to light. Right eye exhibits no discharge. Left eye exhibits no discharge. No scleral icterus.  Neck: Normal range of motion. No  JVD present. No tracheal deviation present.  Pulmonary/Chest: Effort normal. No stridor.  Musculoskeletal:  Right hand and wrist atraumatic no swelling or edema, no redness noted.  Tenderness over the carpal tunnel and proximal hand-Minor decreased sensation along the fifth digit ulnar aspect, grip strength 5 out of 5, radial pulse 2+-joint compartments soft forearm  Neurological: She is alert and oriented to person, place, and time. Coordination normal.  Psychiatric: She has a normal mood and affect. Her behavior is normal. Judgment and thought content normal.  Nursing note and vitals reviewed.    ED Treatments / Results  Labs (all labs ordered are listed, but only abnormal results are displayed) Labs Reviewed - No data to display  EKG  EKG Interpretation None       Radiology No results found.  Procedures Procedures (including critical care time)  SPLINT APPLICATION Date/Time: 6:81 PM Authorized by: Stevie Kern Harlene Petralia Consent: Verbal consent obtained. Risks and benefits: risks, benefits and alternatives were discussed Consent given by: patient Splint applied by: nursing Location details: right wrist Splint type: velcro wrist Supplies used: velcro wrist  Post-procedure: The splinted body part was neurovascularly unchanged following the procedure. Patient tolerance: Patient tolerated the procedure well with no immediate complications.     Medications Ordered in ED Medications - No data to display   Initial Impression / Assessment and Plan / ED Course  I have reviewed the triage vital signs and the nursing notes.  Pertinent labs & imaging results that were available during my care of the patient were reviewed by me and considered in my medical decision making (see chart for details).      Final Clinical Impressions(s) / ED Diagnoses   Final diagnoses:  Carpal tunnel syndrome of right wrist    Labs:   Imaging:  Consults:  Therapeutics:  Discharge  Meds:   Assessment/Plan: 37 year old female presents today with likely carpal tunnel syndrome.  Patient will be encouraged to use ibuprofen, wrist brace, decreased workload and outpatient follow-up if symptoms persist return if they worsen.  She verbalized understanding and agreement to today's plan had no further questions or concerns at the time of discharge.      ED Discharge Orders    None       Francee Gentile 01/04/18 1637    Othello Sgroi, Dellis Filbert, PA-C 01/04/18 1637    Virgel Manifold, MD 01/05/18 [phone]

## 2018-01-04 NOTE — ED Triage Notes (Signed)
Pt reports right wrist pain that started yesterday after picking up a box. No acute distress is noted at triage.

## 2018-01-04 NOTE — ED Notes (Signed)
Splint applied, ice pack given and use discussed per Darrell. Patient states understanding.

## 2018-01-04 NOTE — Discharge Instructions (Signed)
Please read attached information. If you experience any new or worsening signs or symptoms please return to the emergency room for evaluation. Please follow-up with your primary care provider or specialist as discussed.  °

## 2018-01-14 ENCOUNTER — Emergency Department (HOSPITAL_COMMUNITY)
Admission: EM | Admit: 2018-01-14 | Discharge: 2018-01-15 | Disposition: A | Discharge status: Home / Self Care | Payer: No Typology Code available for payment source | Attending: Emergency Medicine | Admitting: Emergency Medicine

## 2018-01-14 ENCOUNTER — Emergency Department (HOSPITAL_COMMUNITY): Payer: No Typology Code available for payment source

## 2018-01-14 DIAGNOSIS — J45909 Unspecified asthma, uncomplicated: Secondary | ICD-10-CM | POA: Insufficient documentation

## 2018-01-14 DIAGNOSIS — Z79899 Other long term (current) drug therapy: Secondary | ICD-10-CM | POA: Diagnosis not present

## 2018-01-14 DIAGNOSIS — R51 Headache: Secondary | ICD-10-CM | POA: Diagnosis present

## 2018-01-14 DIAGNOSIS — F17210 Nicotine dependence, cigarettes, uncomplicated: Secondary | ICD-10-CM | POA: Insufficient documentation

## 2018-01-14 MED ORDER — ACETAMINOPHEN 500 MG PO TABS
1000.0000 mg | ORAL_TABLET | Freq: Once | ORAL | Status: AC
  Administered 2018-01-15: 1000 mg via ORAL
  Filled 2018-01-14: qty 2

## 2018-01-14 MED ORDER — MORPHINE SULFATE (PF) 4 MG/ML IV SOLN
4.0000 mg | Freq: Once | INTRAVENOUS | Status: AC
  Administered 2018-01-14: 4 mg via INTRAVENOUS
  Filled 2018-01-14: qty 1

## 2018-01-14 MED ORDER — SODIUM CHLORIDE 0.9 % IV BOLUS (SEPSIS)
500.0000 mL | Freq: Once | INTRAVENOUS | Status: AC
  Administered 2018-01-14: 500 mL via INTRAVENOUS

## 2018-01-14 MED ORDER — METHOCARBAMOL 1000 MG/10ML IJ SOLN: 1000.0000 mg | Freq: Once | INTRAMUSCULAR | Status: DC

## 2018-01-14 MED ORDER — METHOCARBAMOL 1000 MG/10ML IJ SOLN
1000.0000 mg | Freq: Once | INTRAMUSCULAR | Status: AC
  Administered 2018-01-14: 1000 mg via INTRAVENOUS
  Filled 2018-01-14: qty 10
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
## 2018-01-15 ENCOUNTER — Emergency Department (HOSPITAL_COMMUNITY): Payer: No Typology Code available for payment source

## 2018-01-15 LAB — I-STAT BETA HCG BLOOD, ED (MC, WL, AP ONLY)

## 2018-01-15 MED ORDER — METHOCARBAMOL 500 MG PO TABS: 500.0000 mg | ORAL_TABLET | Freq: Three times a day (TID) | ORAL | 0 refills | Status: DC | PRN

## 2018-01-15 NOTE — Discharge Instructions (Signed)
X-rays and CT scans are negative. For pain take 1000 mg of Tylenol +600 mg of ibuprofen every 6 hours. Take Robaxin 500 mg every 8 hours for associated muscle spasms and tightness. Heat. Rest. Light massage and stretching also will help with muscle tightness.

## 2018-07-20 ENCOUNTER — Encounter (HOSPITAL_COMMUNITY): Payer: Self-pay

## 2018-07-20 ENCOUNTER — Emergency Department (HOSPITAL_COMMUNITY)
Admission: EM | Admit: 2018-07-20 | Discharge: 2018-07-20 | Disposition: A | Discharge status: Home / Self Care | Payer: Medicaid Other | Attending: Emergency Medicine | Admitting: Emergency Medicine

## 2018-07-20 ENCOUNTER — Other Ambulatory Visit: Payer: Self-pay

## 2018-07-20 DIAGNOSIS — Z79899 Other long term (current) drug therapy: Secondary | ICD-10-CM | POA: Diagnosis not present

## 2018-07-20 DIAGNOSIS — R21 Rash and other nonspecific skin eruption: Secondary | ICD-10-CM | POA: Diagnosis present

## 2018-07-20 DIAGNOSIS — Z91040 Latex allergy status: Secondary | ICD-10-CM | POA: Insufficient documentation

## 2018-07-20 DIAGNOSIS — J45909 Unspecified asthma, uncomplicated: Secondary | ICD-10-CM | POA: Diagnosis not present

## 2018-07-20 DIAGNOSIS — F17210 Nicotine dependence, cigarettes, uncomplicated: Secondary | ICD-10-CM | POA: Insufficient documentation

## 2018-07-20 LAB — I-STAT CHEM 8, ED
BUN: 10 mg/dL (ref 6–20)
CALCIUM ION: 1.17 mmol/L (ref 1.15–1.40)
CREATININE: 0.6 mg/dL (ref 0.44–1.00)
Chloride: 104 mmol/L (ref 98–111)
Glucose, Bld: 59 mg/dL — ABNORMAL LOW (ref 70–99)
HCT: 30 % — ABNORMAL LOW (ref 36.0–46.0)
HEMOGLOBIN: 10.2 g/dL — AB (ref 12.0–15.0)
Potassium: 3.9 mmol/L (ref 3.5–5.1)
Sodium: 136 mmol/L (ref 135–145)
TCO2: 21 mmol/L — AB (ref 22–32)

## 2018-07-20 MED ORDER — PREDNISONE 50 MG PO TABS: 50.0000 mg | ORAL_TABLET | Freq: Every day | ORAL | 0 refills | Status: DC

## 2018-07-20 MED ORDER — PREDNISONE 20 MG PO TABS
60.0000 mg | ORAL_TABLET | Freq: Once | ORAL | Status: AC
  Administered 2018-07-20: 60 mg via ORAL
  Filled 2018-07-20: qty 3

## 2018-07-20 NOTE — Discharge Instructions (Addendum)
You were seen in the emergency department today for a rash.  We are unsure the exact cause of her rash.  We trialing a prescription of prednisone, a steroid, to help with this.  We have given you the first dose of prednisone in the emergency department, you do not need to take this today, please start this tomorrow.  We are hoping this helps with irritation/itching as well as the rash itself.  Take Benadryl per over-the-counter dosing instructions for any continued itching.  We have prescribed you new medication(s) today. Discuss the medications prescribed today with your pharmacist as they can have adverse effects and interactions with your other medicines including over the counter and prescribed medications. Seek medical evaluation if you start to experience new or abnormal symptoms after taking one of these medicines, seek care immediately if you start to experience difficulty breathing, feeling of your throat closing, facial swelling, or rash as these could be indications of a more serious allergic reaction  Given we are unsure of the exact cause of this rash would like you to follow-up closely with the primary care appointment we have set up for you next week.  Please be sure to attend this appointment.  Please follow the instructions provided by Levada Dy the case management worker who spoke with you.  Return to the ER anytime for new or worsening symptoms including but not limited to fever, spreading rash, increased pain, trouble breathing, throat closing, inability to keep fluids down, or any other concerns that you may have.

## 2018-07-20 NOTE — Care Management Note (Signed)
Case Management Note  CM consulted for no pcp and no ins with need for immediate follow up.  CM spoke with pt at bedside and choose the [facility].  Appointment made for 07/28/2018.  Information for financial counseling and pharmacy discussed and on AVS.  Updated Petrucelli, PA.  No further CM needs noted at this time.  Advit Trethewey, Benjaman Lobe, RN 07/20/2018, 3:05 PM

## 2018-07-20 NOTE — ED Triage Notes (Signed)
Pt states for approx 2-3 weeks, she has been having hives and raised red areas all over her body. Able to speak in full sentences, no difficulty breathing. Pt denies any new lotions or detergents. Pt states that this happens every summer, but it's the worst this year.

## 2018-07-20 NOTE — ED Provider Notes (Addendum)
[facility] DEPT Provider Note   CSN: [id number] Arrival date & time: 07/20/18  1226     History   Chief Complaint Chief Complaint  Patient presents with  . Allergic Reaction    HPI Cathy [name] is a 37 y.o. female with a history of tobacco abuse, anxiety, asthma, bipolar depression, and PID who presents to the ED with complaints of rash which is been ongoing for 2 weeks.  Patient states the rash is red, pruritic, and subsequently became painful.  She states rash is located to her face upper extremities as well as the lower extremities.  Upper extremity involvement does include the palms.  No specific alleviating or aggravating factors.  No interventions prior to arrival.  Patient states that she does have a history of similar rash which has occurred for the past few summers, typically occurs during the warmer portions of summer, lasts 2 to 3 weeks and resolve spontaneously.  She does report that this time the rash seems worse it is painful, not previously painful.  She cannot recall any specific triggers other than the increased heat, no new exposures/products/foods/environment.  Denies associated trouble breathing, sensation of throat closing, or mucous membrane involvement.  Patient has had one sexual partner in the past 6 months, denies vaginal discharge, she has not been sexually active in months, denies chance of pregnancy, LMP 06/22/18.  Denies fever, arthralgias, or tic exposures. Patient does report family history of lupus, she has never personally been diagnosed with any autoimmune disorders.  HPI  Past Medical History:  Diagnosis Date  . Anxiety   . Asthma   . Bipolar depression ([place])   . Depression   . Gonorrhea   . Pelvic inflammatory disease (PID)   . Trichomonas infection     Patient Active Problem List   Diagnosis Date Noted  . MDD (major depressive disorder) 10/11/2015  . Prenatal care insufficient 09/22/2011  . Large for dates  pregnancy 09/22/2011  . Asthma 09/22/2011    Past Surgical History:  Procedure Laterality Date  . NO PAST SURGERIES       OB History    Gravida  5   Para  4   Term  4   Preterm  0   AB  1   Living  4     SAB  1   TAB  0   Ectopic  0   Multiple  0   Live Births  1            Home Medications    Prior to Admission medications   Medication Sig Start Date End Date Taking? Authorizing Provider  albuterol (PROVENTIL HFA;VENTOLIN HFA) 108 (90 BASE) MCG/ACT inhaler Inhale 2 puffs into the lungs every 4 (four) hours as needed for wheezing or shortness of breath. 05/01/14   Orpah Greek, MD  albuterol (PROVENTIL) (2.5 MG/3ML) 0.083% nebulizer solution Take 3 mLs (2.5 mg total) by nebulization every 4 (four) hours as needed for wheezing or shortness of breath. 05/01/14   Orpah Greek, MD  methocarbamol (ROBAXIN) 500 MG tablet Take 1 tablet (500 mg total) by mouth every 8 (eight) hours as needed for muscle spasms. 01/15/18   Kinnie Feil, PA-C  Multiple Vitamins-Minerals (MULTI ADULT GUMMIES PO) Take 2 tablets by mouth daily.    [provider]  naproxen (NAPROSYN) 250 MG tablet Take 1 tablet (250 mg total) by mouth 2 (two) times daily with a meal. 07/15/17   Waynetta Pean, PA-C  sulfacetamide (BLEPH-10) 10 % ophthalmic solution Place 1-2 drops into the right eye every 4 (four) hours. 10/25/17   Delia Heady, PA-C    Family History Family History  Problem Relation Age of Onset  . Diabetes Maternal Grandmother   . Hypertension Maternal Grandmother   . Diabetes Maternal Grandfather   . Hypertension Maternal Grandfather   . Diabetes Paternal Grandmother   . Hypertension Paternal Grandmother   . Diabetes Paternal Grandfather   . Hypertension Paternal Grandfather     Social History Social History   Tobacco Use  . Smoking status: Current Some Day Smoker    Packs/day: 0.50    Types: Cigarettes  . Smokeless tobacco: Never Used    Substance Use Topics  . Alcohol use: No  . Drug use: Yes    Types: Marijuana    Comment: 1-2 days a week     Allergies   Latex   Review of Systems Review of Systems  All other systems reviewed and are negative.    Physical Exam Updated Vital Signs BP 98/81 (BP Location: Left Arm)   Pulse 79   Temp 98.8 F (37.1 C) (Oral)   Resp 18   Ht 5\' 7"  (1.702 m)   Wt 61.2 kg   LMP 06/22/2018   SpO2 99%   BMI 21.14 kg/m   Physical Exam  Constitutional: She appears well-developed and well-nourished.  Non-toxic appearance. No distress.  HENT:  Head: Normocephalic and atraumatic.  Patient is tolerating her own secretions without difficulty.  No trismus.  Airways patent.  No appreciable posterior oropharyngeal edema.  No intraoral/mucous membrane lesions.  Eyes: Pupils are equal, round, and reactive to light. Conjunctivae are normal. Right eye exhibits no discharge. Left eye exhibits no discharge.  Neck: Neck supple.  Cardiovascular: Normal rate and regular rhythm.  Pulmonary/Chest: Effort normal and breath sounds normal. No stridor. No respiratory distress. She has no wheezes. She has no rhonchi. She has no rales.  Respiration even and unlabored  Abdominal: Soft. She exhibits no distension. There is no tenderness.  Neurological: She is alert.  Clear speech.   Skin: Skin is warm and dry. Rash noted.  Patient has an erythematous slightly raised rash to the face bilateral upper and lower extremities.  Patient's rash to the face appears to be in somewhat of a malar distribution pattern.  Rash to the extremities is more diffuse.  There is involvement of the palms bilaterally- noted to right hypothenar eminence as well as thenar eminence, also noted to left base of the thumb and proximal central palm. Some areas have a bit of scaling. These areas are somewhat tender to palpation and do appear to blance.  There are no appreciable vesicles, pustules, or target lesions.  No purulent drainage.   No palpable fluctuance. Images below.   Psychiatric: She has a normal mood and affect. Her behavior is normal.  Nursing note and vitals reviewed.            ED Treatments / Results  Labs Results for orders placed or performed during the hospital encounter of 07/20/18  I-stat Chem 8, ED  Result Value Ref Range   Sodium 136 135 - 145 mmol/L   Potassium 3.9 3.5 - 5.1 mmol/L   Chloride 104 98 - 111 mmol/L   BUN 10 6 - 20 mg/dL   Creatinine, Ser 0.60 0.44 - 1.00 mg/dL   Glucose, Bld 59 (L) 70 - 99 mg/dL   Calcium, Ion 1.17 1.15 - 1.40 mmol/L  TCO2 21 (L) 22 - 32 mmol/L   Hemoglobin 10.2 (L) 12.0 - 15.0 g/dL   HCT 30.0 (L) 36.0 - 46.0 %   No results found. EKG None  Radiology No results found.  Procedures Procedures (including critical care time)  Medications Ordered in ED Medications  predniSONE (DELTASONE) tablet 60 mg (60 mg Oral Given 07/20/18 1558)     Initial Impression / Assessment and Plan / ED Course  I have reviewed the triage vital signs and the nursing notes.  Pertinent labs & imaging results that were available during my care of the patient were reviewed by me and considered in my medical decision making (see chart for details).   Patient presents the emergency department with painful pruritic rash to the face and extremities x4, rash does have palm involvement, there is no mucous membrane involvement.  Patient is nontoxic-appearing, resting comfortably, her vitals are WNL.  Unclear definitive etiology to her rash.  There is a possibility that this is allergic in nature, there is no evidence of respiratory distress, tolerating her own secretions without difficulty, patent airway, however somewhat odd presentation for this as rash does not appear classically urticarial or like a contact/irritatn dermatitis.  No fevers, increased warmth, fluctuance.  No recent tic exposures or fevers to raise concern for tic borne illness such as RMSF. Given somewhat malar  appearance facially, palm involvement, as well as tenderness to palpation with family history there is some concern for possible autoimmune process such as lupus.  Patient's renal function was checked, creatinine is within normal limits.  We will also send him for HIV and RPR, however low suspicion for this based on presentation and patient's reported sexual history.  Case management was consulted in order to set patient up with primary care for potential rheumatology referral.  Spoke with Levada Dy with case management who has scheduled patient for primary care appointment 07/28/18, she is also provided additional resources to help with prescriptions and finances.  Will place patient on a trial of redness down, first dose given in the ER.  Follow-up as above with return precautions given. I discussed results, treatment plan, need for PCP follow-up, and return precautions with the patient. Provided opportunity for questions, patient confirmed understanding and is in agreement with plan.   Findings and plan of care discussed with supervising physician Dr. Maryan Rued who personally evaluated and examined this patient, provided guidance in care, and is in agreement.    Final Clinical Impressions(s) / ED Diagnoses   Final diagnoses:  Rash    ED Discharge Orders         Ordered    predniSONE (DELTASONE) 50 MG tablet  Daily with breakfast     07/20/18 [address], [place] R, PA-C 07/20/18 1559    Amaryllis Dyke, PA-C 07/20/18 1708    Blanchie Dessert, MD 07/20/18 2100

## 2018-07-21 LAB — RPR: RPR: NONREACTIVE

## 2018-07-21 LAB — HIV ANTIBODY (ROUTINE TESTING W REFLEX): HIV Screen 4th Generation wRfx: NONREACTIVE

## 2018-07-28 ENCOUNTER — Ambulatory Visit (HOSPITAL_COMMUNITY)
Admission: RE | Admit: 2018-07-28 | Discharge: 2018-07-28 | Disposition: A | Discharge status: Home / Self Care | Payer: Medicaid Other | Source: Ambulatory Visit | Attending: Family Medicine | Admitting: Family Medicine

## 2018-07-28 ENCOUNTER — Ambulatory Visit (INDEPENDENT_AMBULATORY_CARE_PROVIDER_SITE_OTHER): Payer: Self-pay | Admitting: Family Medicine

## 2018-07-28 ENCOUNTER — Encounter: Payer: Self-pay | Admitting: Family Medicine

## 2018-07-28 VITALS — BP 111/64 | HR 66 | Temp 98.4°F | Resp 14 | Ht 67.0 in | Wt 141.0 lb

## 2018-07-28 DIAGNOSIS — R059 Cough, unspecified: Secondary | ICD-10-CM

## 2018-07-28 DIAGNOSIS — R21 Rash and other nonspecific skin eruption: Secondary | ICD-10-CM

## 2018-07-28 DIAGNOSIS — R05 Cough: Secondary | ICD-10-CM | POA: Diagnosis present

## 2018-07-28 DIAGNOSIS — N926 Irregular menstruation, unspecified: Secondary | ICD-10-CM

## 2018-07-28 DIAGNOSIS — Z3201 Encounter for pregnancy test, result positive: Secondary | ICD-10-CM

## 2018-07-28 DIAGNOSIS — R591 Generalized enlarged lymph nodes: Secondary | ICD-10-CM

## 2018-07-28 LAB — POCT URINE PREGNANCY: Preg Test, Ur: POSITIVE — AB

## 2018-07-28 MED ORDER — HYDROXYZINE PAMOATE 25 MG PO CAPS: 25.0000 mg | ORAL_CAPSULE | Freq: Three times a day (TID) | ORAL | 0 refills | Status: DC | PRN

## 2018-07-28 MED ORDER — LORATADINE 10 MG PO TABS: 10.0000 mg | ORAL_TABLET | Freq: Every day | ORAL | 2 refills | Status: DC

## 2018-07-28 MED ORDER — PREDNISONE 10 MG (21) PO TBPK: ORAL_TABLET | ORAL | 0 refills | Status: DC

## 2018-07-28 NOTE — Progress Notes (Signed)
[facility]  New Patient Encounter Provider: Lanae Boast, [place]    JQZ:[id number]  MAU:[id number]  DOB - 07-31-1981  SUBJECTIVE:   Cathy [name], is a 37 y.o. female who presents to establish care with this clinic.  has a past medical history of Anxiety, Asthma, Bipolar depression ([place]), Depression, Gonorrhea, Pelvic inflammatory disease (PID), and Trichomonas infection.  Current problems/concerns:  Patient seen in the ED on 07/20/2018 due to rash that she reports has been present for over 1 year. Patient with + family hx of lupus. Pateint states that the rash occurs every summer and usually self resolves. This time the rash is itchy and painful. Patient's HIV and RPR negative on 07/20/2018.   Patient states that the prednisone provided relief of the rash, but discontinuation of the medicine caused the rash to return.   Allergies  Allergen Reactions  . Latex Rash   Past Medical History:  Diagnosis Date  . Anxiety   . Asthma   . Bipolar depression ([place])   . Depression   . Gonorrhea   . Pelvic inflammatory disease (PID)   . Trichomonas infection    Current Outpatient Medications on File Prior to Visit  Medication Sig Dispense Refill  . ibuprofen (ADVIL,MOTRIN) 200 MG tablet Take 400 mg by mouth daily as needed for headache.     No current facility-administered medications on file prior to visit.    Family History  Problem Relation Age of Onset  . Diabetes Maternal Grandmother   . Hypertension Maternal Grandmother   . Diabetes Maternal Grandfather   . Hypertension Maternal Grandfather   . Diabetes Paternal Grandmother   . Hypertension Paternal Grandmother   . Diabetes Paternal Grandfather   . Hypertension Paternal Grandfather    Social History   Socioeconomic History  . Marital status: Single    Spouse name: Not on file  . Number of children: Not on file  . Years of education: Not on file  . Highest education level:  Not on file  Occupational History  . Not on file  Social Needs  . Financial resource strain: Not on file  . Food insecurity:    Worry: Not on file    Inability: Not on file  . Transportation needs:    Medical: Not on file    Non-medical: Not on file  Tobacco Use  . Smoking status: Current Some Day Smoker    Packs/day: 0.50    Types: Cigarettes  . Smokeless tobacco: Never Used  Substance and Sexual Activity  . Alcohol use: No    Frequency: Never  . Drug use: Not Currently    Types: Marijuana    Comment: 1-2 days a week  . Sexual activity: Not Currently    Birth control/protection: None  Lifestyle  . Physical activity:    Days per week: Not on file    Minutes per session: Not on file  . Stress: Not on file  Relationships  . Social connections:    Talks on phone: Not on file    Gets together: Not on file    Attends religious service: Not on file    Active member of club or organization: Not on file    Attends meetings of clubs or organizations: Not on file    Relationship status: Not on file  . Intimate partner violence:    Fear of current or ex partner: Not on file    Emotionally abused: Not on file    Physically  abused: Not on file    Forced sexual activity: Not on file  Other Topics Concern  . Not on file  Social History Narrative  . Not on file    Review of Systems  Musculoskeletal: Positive for joint pain (palms of the hands and hands. ).  Skin: Positive for itching and rash (painful).  All other systems reviewed and are negative.    OBJECTIVE:    BP 111/64 (BP Location: Left Arm, Patient Position: Sitting, Cuff Size: Normal)   Pulse 66   Temp 98.4 F (36.9 C) (Oral)   Resp 14   Ht 5\' 7"  (1.702 m)   Wt 141 lb (64 kg)   LMP 05/31/2018   SpO2 100%   BMI 22.08 kg/m   Physical Exam  Constitutional: She is oriented to person, place, and time and well-developed, well-nourished, and in no distress. No distress.  HENT:  Head: Normocephalic and  atraumatic.  Eyes: Pupils are equal, round, and reactive to light. Conjunctivae and EOM are normal.  Neck: Normal range of motion. Neck supple.  Cardiovascular: Normal rate, regular rhythm and intact distal pulses. Exam reveals no gallop and no friction rub.  No murmur heard. Pulmonary/Chest: Effort normal and breath sounds normal. No respiratory distress. She has no wheezes.  Abdominal: Soft. Bowel sounds are normal. There is no tenderness.  Musculoskeletal: Normal range of motion. She exhibits no edema or tenderness.  Lymphadenopathy:    She has cervical adenopathy.       Right cervical: Posterior cervical adenopathy present.       Left cervical: Posterior cervical adenopathy present.       Right: Supraclavicular adenopathy present.       Left: Supraclavicular adenopathy present.  Neurological: She is alert and oriented to person, place, and time. Gait normal.  Skin: Skin is warm and dry. Rash noted. Rash is maculopapular and urticarial.     Psychiatric: Mood, memory, affect and judgment normal.  Nursing note and vitals reviewed.    ASSESSMENT/PLAN:  1. Rash and nonspecific skin eruption - ANA,IFA RA Diag Pnl w/rflx Tit/Patn - Antiphospholipid Syndrome Comp - Sedimentation Rate - C-reactive protein - Sjogren's syndrome antibods(ssa + ssb) - Mono (Epstein Barr Virus) - DG Chest 2 View; Future  2. Cough - DG Chest 2 View; Future  3. Lymphadenopathy Posterior and supraclavicular lymphadenopathy - Mono (Epstein Barr Virus)  4. Missed period LMP 05/31/2018 - POCT urine pregnancy  5. Positive pregnancy test Patient with + urine HCG. Informed patient and provided a verification letter.    Return if symptoms worsen or fail to improve.  The patient was given clear instructions to go to ER or return to medical center if symptoms don't improve, worsen or new problems develop. The patient verbalized understanding. The patient was told to call to get lab results if they haven't  heard anything in the next week.     This note has been created with Surveyor, quantity. Any transcriptional errors are unintentional.   Ms. Cathy L. Nathaneil Canary, FNP-BC [facility] [address] [place], [place] [address] [phone]

## 2018-07-28 NOTE — Patient Instructions (Signed)
I sent another dose of prednisone to the pharmacy. I also sent a medication to help with itching called hydroxyzine. You can take the hydroxyzine as needed. It may make you sleepy.   I collected labs to rule out an autoimmune disorder. It will take approx 4 days for the results. We will contact you with the results or you can sign up for [facility] and get them sent to you.   Systemic Lupus Erythematosus, Adult Systemic lupus erythematosus is a long-term (chronic) disease that can affect many parts of the body. It can damage the skin, joints, blood vessels, brain, kidneys, lungs, heart, and other internal organs. It causes pain, irritation, and inflammation. Systemic lupus erythematosus is an autoimmune disease. With this type of disease, the body's defense system (immune system) mistakenly attacks normal tissues instead of attacking germs or abnormal growths. What are the causes? The cause of this condition is not known. What increases the risk? This condition is more likely to develop in:  Females.  People of Asian descent.  People of African-American descent.  People who have a family history of the condition.  What are the signs or symptoms? General symptoms include:  Joint pain and swelling (common).  Fever.  Fatigue.  Unusual weight loss or weight gain.  Skin rashes, especially over the nose and cheeks (butterfly rash) and after sun exposure.  Sores inside the mouth or nose.  Other symptoms depend on which parts of the body are affected. They can include:  Shortness of breath.  Chest pain.  Frequent urination.  Blood in the urine.  Seizures.  Mental changes.  Hair loss.  Swollen and tender lymph nodes.  Swelling of the hands or feet.  Symptoms can come and go. A period of time when symptoms get worse or come back is called a flare. A period of time with no symptoms is called a remission. How is this diagnosed? This condition is diagnosed based on symptoms,  a medical history, and a physical exam. You may also have tests, including:  Blood tests.  Urine tests.  A chest X-ray.  A skin or kidney biopsy. For this test, a sample of tissue is taken from the skin or kidney and studied under a microscope.  You may be referred to an autoimmune disease specialist (rheumatologist). How is this treated? There is no cure for this condition, but treatment can keep the disease in remission, help to control symptoms, and prevent damage to the heart, lungs, kidneys, and other organs. Treatment may involve taking a combination of medicines over time. Follow these instructions at home: Medicines  Take medicines only as directed by your health care provider.  Do not take any medicines that contain estrogen without first checking with your health care provider. Estrogen can trigger flares and may increase your risk for blood clots. Lifestyle  Eat a heart-healthy diet.  Stay active as directed by your health care provider.  Do not smoke. If you need help quitting, ask your health care provider.  Protect your skin from the sun by applying sunblock and wearing protective hats and clothing.  Learn as much as you can about your condition and have a good support system in place. Support may come from family, friends, or a lupus support group. General instructions  Keep all follow-up visits as directed by your health care provider. This is important.  Work closely with all of your health care providers to manage your condition.  Let your health care provider know right away if  you become pregnant or if you plan to become pregnant. Pregnancy in women with this condition is considered high risk. Contact a health care provider if:  You have a fever.  Your symptoms flare.  You develop new symptoms.  You develop swollen feet or hands.  You develop puffiness around your eyes.  Your medicines are not working.  You have bloody, foamy, or coffee-colored  urine.  There are changes in your urination. For example, you urinate more often at night.  You think that you may be depressed or have anxiety. Get help right away if:  You have chest pain.  You have trouble breathing.  You have a seizure.  You suddenly get a very bad headache.  You suddenly develop facial or body weakness.  You cannot speak.  You cannot understand speech. This information is not intended to replace advice given to you by your health care provider. Make sure you discuss any questions you have with your health care provider. Document Released: 11/07/2002 Document Revised: 07/13/2016 Document Reviewed: 10/25/2014 Elsevier Interactive Patient Education  [employer].

## 2018-08-12 LAB — SEDIMENTATION RATE: Sed Rate: 22 mm/hr (ref 0–32)

## 2018-08-12 LAB — ANTIPHOSPHOLIPID SYNDROME COMP
APTT: 24.6 s
Anticardiolipin Ab, IgA: <10 [APL'U]
Anticardiolipin Ab, IgG: <10 [GPL'U]
Anticardiolipin Ab, IgM: 14 [MPL'U]
Antiphosphatidylserine IgG: 5 {GPS'U}
Antiphosphatidylserine IgM: 0 {MPS'U}
Antiprothrombin Antibody, IgG: 11 G units
Beta-2 Glycoprotein I, IgA: <10 SAU
Beta-2 Glycoprotein I, IgG: <10 SGU
Beta-2 Glycoprotein I, IgM: <10 SMU
DRVVT Screen Seconds: 25.2 s
Hexagonal Phospholipid Neutral: 0 s
Platelet Neutralization: 0 s

## 2018-08-12 LAB — ANA,IFA RA DIAG PNL W/RFLX TIT/PATN
ANA Titer 1: NEGATIVE
Cyclic Citrullin Peptide Ab: 14 units (ref 0–19)
Rhuematoid fact SerPl-aCnc: 82.1 IU/mL — ABNORMAL HIGH (ref 0.0–13.9)

## 2018-08-12 LAB — SJOGREN'S SYNDROME ANTIBODS(SSA + SSB)
ENA SSA (RO) Ab: >8 AI — ABNORMAL HIGH (ref 0.0–0.9)
ENA SSB (LA) Ab: >8 AI — ABNORMAL HIGH (ref 0.0–0.9)

## 2018-08-12 LAB — C-REACTIVE PROTEIN: CRP: 4 mg/L (ref 0–10)

## 2018-08-18 ENCOUNTER — Other Ambulatory Visit: Payer: Self-pay | Admitting: Family Medicine

## 2018-08-18 DIAGNOSIS — R768 Other specified abnormal immunological findings in serum: Secondary | ICD-10-CM

## 2018-08-18 DIAGNOSIS — R899 Unspecified abnormal finding in specimens from other organs, systems and tissues: Secondary | ICD-10-CM

## 2018-08-18 DIAGNOSIS — R21 Rash and other nonspecific skin eruption: Secondary | ICD-10-CM

## 2018-08-18 NOTE — Progress Notes (Signed)
Referral for rheum

## 2018-08-19 ENCOUNTER — Telehealth: Payer: Self-pay

## 2018-08-19 ENCOUNTER — Other Ambulatory Visit: Payer: Self-pay | Admitting: Family Medicine

## 2018-08-19 DIAGNOSIS — R21 Rash and other nonspecific skin eruption: Secondary | ICD-10-CM

## 2018-08-19 NOTE — Telephone Encounter (Signed)
Called and spoke with patient, advised that her labs are abnormal and consistent with an autoimmune disease and we are referring her to rheumatology for further evaluation. Patient verbalized understanding. Thanks!

## 2018-08-19 NOTE — Telephone Encounter (Signed)
-----   Message from Lanae Boast, [place] sent at 08/18/2018  5:00 PM EDT ----- Abnormal labs that are consistent with an autoimmune disease.  with +rash will refer to rheumatology

## 2018-08-19 NOTE — Progress Notes (Signed)
Referral for derm per rheumatology

## 2018-08-23 ENCOUNTER — Telehealth: Payer: Self-pay | Admitting: Family Medicine

## 2018-08-23 NOTE — Telephone Encounter (Signed)
Received fax from [facility] stating patient cancelled appt because she doesn't have the money to see them. She had appointment 10/08/2018. Provider aware.

## 2018-08-23 NOTE — Progress Notes (Deleted)
Office Visit Note  Patient: Cathy [name]             Date of Birth: [birth year]           MRN: [id number]             PCP: Lanae Boast, [place] Referring: Lanae Boast, FNP Visit Date: 09/01/2018 Occupation: @GUAROCC @  Subjective:  No chief complaint on file.   History of Present Illness: Cathy [name] is a 37 y.o. female ***   Activities of Daily Living:  Patient reports morning stiffness for *** {minute/hour:19697}.   Patient {ACTIONS;DENIES/REPORTS:21021675::"Denies"} nocturnal pain.  Difficulty dressing/grooming: {ACTIONS;DENIES/REPORTS:21021675::"Denies"} Difficulty climbing stairs: {ACTIONS;DENIES/REPORTS:21021675::"Denies"} Difficulty getting out of chair: {ACTIONS;DENIES/REPORTS:21021675::"Denies"} Difficulty using hands for taps, buttons, cutlery, and/or writing: {ACTIONS;DENIES/REPORTS:21021675::"Denies"}  No Rheumatology ROS completed.   PMFS History:  Patient Active Problem List   Diagnosis Date Noted  . MDD (major depressive disorder) 10/11/2015  . Prenatal care insufficient 09/22/2011  . Large for dates pregnancy 09/22/2011  . Asthma 09/22/2011    Past Medical History:  Diagnosis Date  . Anxiety   . Asthma   . Bipolar depression ([place])   . Depression   . Gonorrhea   . Pelvic inflammatory disease (PID)   . Trichomonas infection     Family History  Problem Relation Age of Onset  . Diabetes Maternal Grandmother   . Hypertension Maternal Grandmother   . Diabetes Maternal Grandfather   . Hypertension Maternal Grandfather   . Diabetes Paternal Grandmother   . Hypertension Paternal Grandmother   . Diabetes Paternal Grandfather   . Hypertension Paternal Grandfather    Past Surgical History:  Procedure Laterality Date  . NO PAST SURGERIES     Social History   Social History Narrative  . Not on file    Objective: Vital Signs: There were no vitals taken for this visit.   Physical Exam   Musculoskeletal Exam: ***  CDAI Exam: CDAI  Score: Not documented Patient Global Assessment: Not documented; Provider Global Assessment: Not documented Swollen: Not documented; Tender: Not documented Joint Exam   Not documented   There is currently no information documented on the homunculus. Go to the Rheumatology activity and complete the homunculus joint exam.  Investigation: No additional findings. Component     Latest Ref Rng & Units 07/28/2018  APTT     sec 24.6  APTT 1:1 NP     sec CANCELED  APTT 1:1 Saline     sec CANCELED  DRVVT Screen Seconds     sec 25.2  DRVVT Confirm Seconds     sec CANCELED  DRVVT Ratio     ratio CANCELED  Hexagonal Phospholipid Neutral     sec 0  Platelet Neutralization     sec 0.0  Anticardiolipin Ab, IgG     GPL <10  Anticardiolipin Ab, IgM     MPL 14  Anticardiolipin Ab, IgA     APL <10  Beta-2 Glycoprotein I, IgG     SGU <10  Beta-2 Glycoprotein I, IgM     SMU <10  Beta-2 Glycoprotein I, IgA     SAU <10  Antiphosphatidylserine IgG     GPS 5  Antiphosphatidylserine IgM     MPS 0  Antiprothrombin Antibody, IgG     G units 11  LAC Interpretation      Comment  ANA Titer 1      Negative  RA Latex Turbid.     0.0 - 13.9 IU/mL 66.0 (H)  Cyclic Citrullin Peptide  Ab     0 - 19 units 14  ENA SSA (RO) Ab     0.0 - 0.9 AI >8.0 (H)  ENA SSB (LA) Ab     0.0 - 0.9 AI >8.0 (H)  CRP     0 - 10 mg/L 4  Sed Rate     0 - 32 mm/hr 22   Imaging: Dg Chest 2 View  Result Date: 07/28/2018 CLINICAL DATA:  Skin rash. Occasional mid chest pain. Question sarcoidosis. EXAM: CHEST - 2 VIEW COMPARISON:  PA and lateral chest 01/15/2018 and 04/13/2018. FINDINGS: The lungs are clear. Cardiac and mediastinal contours appear normal without evidence of lymphadenopathy. No pneumothorax or pleural effusion. No acute or focal bony abnormality. IMPRESSION: Negative for sarcoidosis.  Negative chest. Electronically Signed   By: Inge Rise M.D.   On: 07/28/2018 16:08    Recent Labs: Lab  Results  Component Value Date   WBC 6.0 01/10/2017   HGB 10.2 (L) 07/20/2018   PLT 174 01/10/2017   NA 136 07/20/2018   K 3.9 07/20/2018   CL 104 07/20/2018   CO2 24 01/10/2017   GLUCOSE 59 (L) 07/20/2018   BUN 10 07/20/2018   CREATININE 0.60 07/20/2018   BILITOT 0.3 01/10/2017   ALKPHOS 56 01/10/2017   AST 21 01/10/2017   ALT 23 01/10/2017   PROT 7.2 01/10/2017   ALBUMIN 3.3 (L) 01/10/2017   CALCIUM 8.9 01/10/2017   GFRAA >60 01/10/2017    Speciality Comments: No specialty comments available.  Procedures:  No procedures performed Allergies: Latex   Assessment / Plan:     Visit Diagnoses: Rheumatoid factor positive  Rash and other nonspecific skin eruption  History of bipolar disorder  Anxiety and depression  History of asthma   Orders: No orders of the defined types were placed in this encounter.  No orders of the defined types were placed in this encounter.   Face-to-face time spent with patient was *** minutes. Greater than 50% of time was spent in counseling and coordination of care.  Follow-Up Instructions: No follow-ups on file.   Ofilia Neas, PA-C  Note - This record has been created using Dragon software.  Chart creation errors have been sought, but may not always  have been located. Such creation errors do not reflect on  the standard of medical care.

## 2018-09-01 ENCOUNTER — Ambulatory Visit: Payer: Self-pay | Admitting: Rheumatology

## 2018-09-03 ENCOUNTER — Encounter: Payer: Self-pay | Admitting: Obstetrics & Gynecology

## 2018-09-20 ENCOUNTER — Other Ambulatory Visit: Payer: Self-pay

## 2018-09-20 ENCOUNTER — Inpatient Hospital Stay (HOSPITAL_COMMUNITY)
Admission: AD | Admit: 2018-09-20 | Discharge: 2018-09-29 | DRG: 832 | Disposition: A | Discharge status: Home / Self Care | Payer: Medicaid Other | Source: Ambulatory Visit | Attending: Internal Medicine | Admitting: Internal Medicine

## 2018-09-20 ENCOUNTER — Inpatient Hospital Stay (HOSPITAL_COMMUNITY): Payer: Medicaid Other

## 2018-09-20 ENCOUNTER — Encounter (HOSPITAL_COMMUNITY): Payer: Self-pay | Admitting: *Deleted

## 2018-09-20 ENCOUNTER — Inpatient Hospital Stay (HOSPITAL_BASED_OUTPATIENT_CLINIC_OR_DEPARTMENT_OTHER): Payer: Medicaid Other

## 2018-09-20 DIAGNOSIS — R079 Chest pain, unspecified: Secondary | ICD-10-CM | POA: Diagnosis not present

## 2018-09-20 DIAGNOSIS — O0932 Supervision of pregnancy with insufficient antenatal care, second trimester: Secondary | ICD-10-CM | POA: Diagnosis not present

## 2018-09-20 DIAGNOSIS — O0930 Supervision of pregnancy with insufficient antenatal care, unspecified trimester: Secondary | ICD-10-CM

## 2018-09-20 DIAGNOSIS — O219 Vomiting of pregnancy, unspecified: Secondary | ICD-10-CM | POA: Diagnosis present

## 2018-09-20 DIAGNOSIS — O99412 Diseases of the circulatory system complicating pregnancy, second trimester: Secondary | ICD-10-CM | POA: Diagnosis present

## 2018-09-20 DIAGNOSIS — M329 Systemic lupus erythematosus, unspecified: Secondary | ICD-10-CM | POA: Diagnosis present

## 2018-09-20 DIAGNOSIS — Z8249 Family history of ischemic heart disease and other diseases of the circulatory system: Secondary | ICD-10-CM

## 2018-09-20 DIAGNOSIS — J4520 Mild intermittent asthma, uncomplicated: Secondary | ICD-10-CM

## 2018-09-20 DIAGNOSIS — Z833 Family history of diabetes mellitus: Secondary | ICD-10-CM

## 2018-09-20 DIAGNOSIS — O26832 Pregnancy related renal disease, second trimester: Secondary | ICD-10-CM | POA: Diagnosis present

## 2018-09-20 DIAGNOSIS — Z3A16 16 weeks gestation of pregnancy: Secondary | ICD-10-CM | POA: Diagnosis not present

## 2018-09-20 DIAGNOSIS — F1210 Cannabis abuse, uncomplicated: Secondary | ICD-10-CM | POA: Diagnosis present

## 2018-09-20 DIAGNOSIS — F17210 Nicotine dependence, cigarettes, uncomplicated: Secondary | ICD-10-CM | POA: Diagnosis present

## 2018-09-20 DIAGNOSIS — F1910 Other psychoactive substance abuse, uncomplicated: Secondary | ICD-10-CM | POA: Diagnosis present

## 2018-09-20 DIAGNOSIS — D519 Vitamin B12 deficiency anemia, unspecified: Secondary | ICD-10-CM | POA: Diagnosis present

## 2018-09-20 DIAGNOSIS — R7989 Other specified abnormal findings of blood chemistry: Secondary | ICD-10-CM | POA: Diagnosis not present

## 2018-09-20 DIAGNOSIS — Z590 Homelessness: Secondary | ICD-10-CM | POA: Diagnosis not present

## 2018-09-20 DIAGNOSIS — M3214 Glomerular disease in systemic lupus erythematosus: Secondary | ICD-10-CM | POA: Diagnosis present

## 2018-09-20 DIAGNOSIS — N760 Acute vaginitis: Secondary | ICD-10-CM | POA: Diagnosis not present

## 2018-09-20 DIAGNOSIS — O0942 Supervision of pregnancy with grand multiparity, second trimester: Secondary | ICD-10-CM

## 2018-09-20 DIAGNOSIS — R0781 Pleurodynia: Secondary | ICD-10-CM | POA: Diagnosis present

## 2018-09-20 DIAGNOSIS — O99112 Other diseases of the blood and blood-forming organs and certain disorders involving the immune mechanism complicating pregnancy, second trimester: Secondary | ICD-10-CM | POA: Diagnosis present

## 2018-09-20 DIAGNOSIS — O99012 Anemia complicating pregnancy, second trimester: Secondary | ICD-10-CM | POA: Diagnosis present

## 2018-09-20 DIAGNOSIS — Z3A15 15 weeks gestation of pregnancy: Secondary | ICD-10-CM | POA: Diagnosis not present

## 2018-09-20 DIAGNOSIS — D649 Anemia, unspecified: Secondary | ICD-10-CM | POA: Diagnosis not present

## 2018-09-20 DIAGNOSIS — D6862 Lupus anticoagulant syndrome: Secondary | ICD-10-CM | POA: Diagnosis present

## 2018-09-20 DIAGNOSIS — O99332 Smoking (tobacco) complicating pregnancy, second trimester: Secondary | ICD-10-CM | POA: Diagnosis present

## 2018-09-20 DIAGNOSIS — O99512 Diseases of the respiratory system complicating pregnancy, second trimester: Secondary | ICD-10-CM | POA: Diagnosis present

## 2018-09-20 DIAGNOSIS — R10819 Abdominal tenderness, unspecified site: Secondary | ICD-10-CM

## 2018-09-20 DIAGNOSIS — O99342 Other mental disorders complicating pregnancy, second trimester: Secondary | ICD-10-CM | POA: Diagnosis present

## 2018-09-20 DIAGNOSIS — M3212 Pericarditis in systemic lupus erythematosus: Secondary | ICD-10-CM | POA: Diagnosis present

## 2018-09-20 DIAGNOSIS — O99282 Endocrine, nutritional and metabolic diseases complicating pregnancy, second trimester: Secondary | ICD-10-CM | POA: Diagnosis not present

## 2018-09-20 DIAGNOSIS — N179 Acute kidney failure, unspecified: Secondary | ICD-10-CM | POA: Diagnosis present

## 2018-09-20 DIAGNOSIS — I517 Cardiomegaly: Secondary | ICD-10-CM | POA: Diagnosis present

## 2018-09-20 DIAGNOSIS — E875 Hyperkalemia: Secondary | ICD-10-CM | POA: Diagnosis present

## 2018-09-20 DIAGNOSIS — O99322 Drug use complicating pregnancy, second trimester: Secondary | ICD-10-CM | POA: Diagnosis present

## 2018-09-20 DIAGNOSIS — R197 Diarrhea, unspecified: Secondary | ICD-10-CM | POA: Diagnosis present

## 2018-09-20 DIAGNOSIS — D72829 Elevated white blood cell count, unspecified: Secondary | ICD-10-CM | POA: Diagnosis not present

## 2018-09-20 DIAGNOSIS — J45909 Unspecified asthma, uncomplicated: Secondary | ICD-10-CM | POA: Diagnosis present

## 2018-09-20 DIAGNOSIS — E860 Dehydration: Secondary | ICD-10-CM | POA: Diagnosis present

## 2018-09-20 DIAGNOSIS — F319 Bipolar disorder, unspecified: Secondary | ICD-10-CM | POA: Diagnosis present

## 2018-09-20 DIAGNOSIS — R011 Cardiac murmur, unspecified: Secondary | ICD-10-CM | POA: Diagnosis present

## 2018-09-20 HISTORY — DX: Reserved for concepts with insufficient information to code with codable children: IMO0002

## 2018-09-20 HISTORY — DX: Systemic lupus erythematosus, unspecified: M32.9

## 2018-09-20 LAB — COMPREHENSIVE METABOLIC PANEL
ALK PHOS: 57 U/L (ref 38–126)
ALK PHOS: 56 U/L (ref 38–126)
ALT: 37 U/L (ref 0–44)
ALT: 36 U/L (ref 0–44)
AST: 27 U/L (ref 15–41)
AST: 27 U/L (ref 15–41)
Albumin: 3 g/dL — ABNORMAL LOW (ref 3.5–5.0)
Albumin: 3 g/dL — ABNORMAL LOW (ref 3.5–5.0)
Anion gap: 4 — ABNORMAL LOW (ref 5–15)
Anion gap: 5 (ref 5–15)
BUN: 24 mg/dL — ABNORMAL HIGH (ref 6–20)
BUN: 24 mg/dL — AB (ref 6–20)
CALCIUM: 8.1 mg/dL — AB (ref 8.9–10.3)
CALCIUM: 8 mg/dL — AB (ref 8.9–10.3)
CO2: 17 mmol/L — ABNORMAL LOW (ref 22–32)
CO2: 17 mmol/L — ABNORMAL LOW (ref 22–32)
CREATININE: 2.44 mg/dL — AB (ref 0.44–1.00)
CREATININE: 2.51 mg/dL — AB (ref 0.44–1.00)
Chloride: 110 mmol/L (ref 98–111)
Chloride: 110 mmol/L (ref 98–111)
GFR calc non Af Amer: 24 mL/min — ABNORMAL LOW (ref >60)
GFR, EST AFRICAN AMERICAN: 29 mL/min — AB (ref >60)
GFR, EST AFRICAN AMERICAN: 28 mL/min — AB (ref >60)
GFR, EST NON AFRICAN AMERICAN: 25 mL/min — AB (ref >60)
Glucose, Bld: 81 mg/dL (ref 70–99)
Glucose, Bld: 108 mg/dL — ABNORMAL HIGH (ref 70–99)
Potassium: 7 mmol/L (ref 3.5–5.1)
Potassium: 6.1 mmol/L — ABNORMAL HIGH (ref 3.5–5.1)
SODIUM: 132 mmol/L — AB (ref 135–145)
Sodium: 131 mmol/L — ABNORMAL LOW (ref 135–145)
TOTAL PROTEIN: 7.5 g/dL (ref 6.5–8.1)
Total Bilirubin: 0.4 mg/dL (ref 0.3–1.2)
Total Bilirubin: 0.4 mg/dL (ref 0.3–1.2)
Total Protein: 7.6 g/dL (ref 6.5–8.1)

## 2018-09-20 LAB — TROPONIN I: Troponin I: <0.03 ng/mL (ref <0.03)

## 2018-09-20 LAB — URINALYSIS, ROUTINE W REFLEX MICROSCOPIC
BILIRUBIN URINE: NEGATIVE
GLUCOSE, UA: NEGATIVE mg/dL
Ketones, ur: NEGATIVE mg/dL
Nitrite: NEGATIVE
PH: 6 (ref 5.0–8.0)
Protein, ur: 100 mg/dL — AB
SPECIFIC GRAVITY, URINE: 1.01 (ref 1.005–1.030)

## 2018-09-20 LAB — BASIC METABOLIC PANEL
ANION GAP: 5 (ref 5–15)
BUN: 19 mg/dL (ref 6–20)
CHLORIDE: 109 mmol/L (ref 98–111)
CO2: 18 mmol/L — AB (ref 22–32)
Calcium: 7.8 mg/dL — ABNORMAL LOW (ref 8.9–10.3)
Creatinine, Ser: 2.43 mg/dL — ABNORMAL HIGH (ref 0.44–1.00)
GFR calc Af Amer: 29 mL/min — ABNORMAL LOW (ref >60)
GFR, EST NON AFRICAN AMERICAN: 25 mL/min — AB (ref >60)
Glucose, Bld: 90 mg/dL (ref 70–99)
POTASSIUM: 5.5 mmol/L — AB (ref 3.5–5.1)
Sodium: 132 mmol/L — ABNORMAL LOW (ref 135–145)

## 2018-09-20 LAB — I-STAT VENOUS BLOOD GAS, ED
Acid-base deficit: 6 mmol/L — ABNORMAL HIGH (ref 0.0–2.0)
BICARBONATE: 18.6 mmol/L — AB (ref 20.0–28.0)
O2 SAT: 39 %
PO2 VEN: 23 mmHg — AB (ref 32.0–45.0)
TCO2: 20 mmol/L — ABNORMAL LOW (ref 22–32)
pCO2, Ven: 33.1 mmHg — ABNORMAL LOW (ref 44.0–60.0)
pH, Ven: 7.358 (ref 7.250–7.430)

## 2018-09-20 LAB — RAPID URINE DRUG SCREEN, HOSP PERFORMED
AMPHETAMINES: NOT DETECTED
Barbiturates: NOT DETECTED
Benzodiazepines: NOT DETECTED
Cocaine: NOT DETECTED
Opiates: NOT DETECTED
Tetrahydrocannabinol: POSITIVE — AB

## 2018-09-20 LAB — CBC WITH DIFFERENTIAL/PLATELET
BASOS ABS: 0 10*3/uL (ref 0.0–0.1)
Basophils Relative: 0 %
EOS PCT: 1 %
Eosinophils Absolute: 0.1 10*3/uL (ref 0.0–0.5)
HCT: 22.3 % — ABNORMAL LOW (ref 36.0–46.0)
HEMOGLOBIN: 8 g/dL — AB (ref 12.0–15.0)
LYMPHS ABS: 1 10*3/uL (ref 0.7–4.0)
LYMPHS PCT: 16 %
MCH: 30.1 pg (ref 26.0–34.0)
MCHC: 35.9 g/dL (ref 30.0–36.0)
MCV: 83.8 fL (ref 80.0–100.0)
Monocytes Absolute: 0.2 10*3/uL (ref 0.1–1.0)
Monocytes Relative: 3 %
NEUTROS PCT: 80 %
NRBC: 0 % (ref 0.0–0.2)
Neutro Abs: 4.9 10*3/uL (ref 1.7–7.7)
Platelets: 221 10*3/uL (ref 150–400)
RBC: 2.66 MIL/uL — ABNORMAL LOW (ref 3.87–5.11)
RDW: 12.3 % (ref 11.5–15.5)
WBC: 6.1 10*3/uL (ref 4.0–10.5)

## 2018-09-20 LAB — LIPASE, BLOOD: LIPASE: 28 U/L (ref 11–51)

## 2018-09-20 LAB — SODIUM, URINE, RANDOM: Sodium, Ur: 72 mmol/L

## 2018-09-20 LAB — CREATININE, URINE, RANDOM: Creatinine, Urine: 87.02 mg/dL

## 2018-09-20 MED ORDER — ALBUTEROL SULFATE (2.5 MG/3ML) 0.083% IN NEBU: 2.5000 mg | INHALATION_SOLUTION | RESPIRATORY_TRACT | Status: DC | PRN

## 2018-09-20 MED ORDER — COMPLETENATE 29-1 MG PO CHEW
1.0000 | CHEWABLE_TABLET | Freq: Every day | ORAL | Status: DC
  Filled 2018-09-20: qty 1

## 2018-09-20 MED ORDER — SODIUM CHLORIDE 0.9 % IV SOLN: INTRAVENOUS | Status: DC

## 2018-09-20 MED ORDER — STERILE WATER FOR INJECTION IV SOLN
INTRAVENOUS | Status: DC
  Administered 2018-09-20 – 2018-09-24 (×6): via INTRAVENOUS
  Filled 2018-09-20 (×15): qty 850

## 2018-09-20 MED ORDER — PATIROMER SORBITEX CALCIUM 8.4 G PO PACK
8.4000 g | PACK | Freq: Every day | ORAL | Status: DC
  Administered 2018-09-20 – 2018-09-27 (×8): 8.4 g via ORAL
  Filled 2018-09-20 (×10): qty 1

## 2018-09-20 NOTE — MAU Note (Signed)
Mouth has been feeling numb.  Hasn't been able to eat anything, nauseated- not vomiting.  Diarrhea and frequent urination(every 5 min), pain with urination. Only eating salt and vinegar chips, ice and water.  Has headache and abd pain. This has been going on for about 2 wks.  No prenatal care.

## 2018-09-20 NOTE — Progress Notes (Addendum)
CSW met with pt at bedside. Pt had a number of family members in the room with her. Pt reported that she is currently staying a shelter with her 35 year old son. Pt's son is currently in 1st grade. Pt did not disclose what shelter she is staying at. Pt stated that her son's godmother will stay with her and her son over night. Pt is not agreeable to have son stay at family member's home. Pt did not disclose reasoning behind decision. CSW explained that son and his godmother may not have places to sleep in her room. Pt understanding and wants son and his godmother to stay with her.   Pt stated that she updated the shelter that she at the hospital and declined CSW's offer to do so.   CSW will leave handoff for daytime floor CSW to follow up with pt and family in the morning.   Wendelyn Breslow, [facility]  [phone]

## 2018-09-20 NOTE — ED Notes (Signed)
Pt given saltines and ginger ale. 

## 2018-09-20 NOTE — ED Provider Notes (Addendum)
[facility] EMERGENCY DEPARTMENT Provider Note   CSN: [id number] Arrival date & time: 09/20/18  1208     History   Chief Complaint Chief Complaint  Patient presents with  . Diarrhea    HPI FOSTER SONNIER is a 37 y.o. female.  HPI  Patient is a 37 year old female with a history of anxiety/depression, bipolar disorder, asthma, PID, and recently diagnosed lupus (2 months ago) who presents to the emergency department today via [place] from [facility].  Patient currently 15 weeks and 6 days pregnant.  She has had no prenatal care. She was seen at [facility] prior to arrival with complaints of diarrhea, nausea, suprapubic abdominal pain, and urinary frequency.  Ultrasound was completed which revealed normal fetal movements.  Labs revealed an elevated bun/creatinine at 24/2.44. Nephrology was consulted who recommended transfer to the ED and admission to the hospitalist service.   Pt also reporting that over the last 2 weeks she has had intermittent headaches, chest pain, DOE, a dry cough, bilat foot/hand swelling. Reports a h/o low iron but is not currently on iron supplementation.   Past Medical History:  Diagnosis Date  . Anxiety   . Asthma   . Bipolar depression ([place])   . Depression   . Gonorrhea   . Lupus ([place])   . Pelvic inflammatory disease (PID)   . Trichomonas infection     Patient Active Problem List   Diagnosis Date Noted  . Lupus ([place]) 09/20/2018  . Supervision of pregnancy with grand multiparity in second trimester 09/20/2018  . Substance abuse ([place]) 09/20/2018  . Acute renal failure (ARF) ([place]) 09/20/2018  . Chest pain 09/20/2018  . Dehydration 09/20/2018  . MDD (major depressive disorder) 10/11/2015  . Asthma 09/22/2011    Past Surgical History:  Procedure Laterality Date  . NO PAST SURGERIES       OB History    Gravida  6   Para  4   Term  4   Preterm  0   AB  1   Living  4     SAB  1   TAB  0   Ectopic    0   Multiple  0   Live Births  1            Home Medications    Prior to Admission medications   Medication Sig Start Date End Date Taking? Authorizing Provider  loratadine (CLARITIN) 10 MG tablet Take 1 tablet (10 mg total) by mouth daily. Patient not taking: Reported on 09/20/2018 07/28/18   Lanae Boast, FNP    Family History Family History  Problem Relation Age of Onset  . Diabetes Maternal Grandmother   . Hypertension Maternal Grandmother   . Diabetes Maternal Grandfather   . Hypertension Maternal Grandfather   . Diabetes Paternal Grandmother   . Hypertension Paternal Grandmother   . Diabetes Paternal Grandfather   . Hypertension Paternal Grandfather     Social History Social History   Tobacco Use  . Smoking status: Current Some Day Smoker    Packs/day: 0.25    Types: Cigarettes  . Smokeless tobacco: Never Used  Substance Use Topics  . Alcohol use: Not Currently    Frequency: Never  . Drug use: Yes    Types: Marijuana    Comment: 3 times a week      Allergies   Patient has no known allergies.   Review of Systems Review of Systems  Constitutional: Negative for fever.  HENT: Negative  for congestion.   Eyes: Negative for visual disturbance.  Respiratory: Positive for shortness of breath.   Cardiovascular: Positive for chest pain. Negative for leg swelling.  Gastrointestinal: Positive for abdominal pain, diarrhea and nausea. Negative for blood in stool, constipation and vomiting.  Genitourinary: Positive for frequency and vaginal discharge. Negative for dysuria, hematuria and urgency.  Musculoskeletal: Negative for back pain.  Skin: Negative for rash.  Neurological: Positive for headaches.   Physical Exam Updated Vital Signs BP 109/71   Pulse 82   Temp 98.6 F (37 C) (Oral)   Resp 20   Wt 65.9 kg   LMP 06/22/2018   SpO2 100%   BMI 22.75 kg/m   Physical Exam  Constitutional: She appears well-developed and well-nourished. No distress.   HENT:  Head: Normocephalic and atraumatic.  Eyes: Conjunctivae are normal.  Neck: Neck supple.  Cardiovascular: Normal rate, regular rhythm and intact distal pulses.  Murmur heard. Pulmonary/Chest: Effort normal and breath sounds normal. No respiratory distress.  Abdominal: Soft. Bowel sounds are normal.  Gravid abdomen. Mild ttp to LLQ, RLQ, and suprapubic areas. No CVA TTP. No rebound and guarding.  Musculoskeletal: Normal range of motion.  No calf TTP, erythema, swelling.  Neurological: She is alert.  Skin: Skin is warm and dry.  Psychiatric: She has a normal mood and affect.  Nursing note and vitals reviewed.  ED Treatments / Results  Labs (all labs ordered are listed, but only abnormal results are displayed) Labs Reviewed  URINALYSIS, ROUTINE W REFLEX MICROSCOPIC - Abnormal; Notable for the following components:      Result Value   APPearance HAZY (*)    Hgb urine dipstick LARGE (*)    Protein, ur 100 (*)    Leukocytes, UA TRACE (*)    Bacteria, UA RARE (*)    All other components within normal limits  CBC WITH DIFFERENTIAL/PLATELET - Abnormal; Notable for the following components:   RBC 2.66 (*)    Hemoglobin 8.0 (*)    HCT 22.3 (*)    All other components within normal limits  COMPREHENSIVE METABOLIC PANEL - Abnormal; Notable for the following components:   Sodium 131 (*)    Potassium 7.0 (*)    CO2 17 (*)    BUN 24 (*)    Creatinine, Ser 2.44 (*)    Calcium 8.1 (*)    Albumin 3.0 (*)    GFR calc non Af Amer 25 (*)    GFR calc Af Amer 29 (*)    Anion gap 4 (*)    All other components within normal limits  RAPID URINE DRUG SCREEN, HOSP PERFORMED - Abnormal; Notable for the following components:   Tetrahydrocannabinol POSITIVE (*)    All other components within normal limits  COMPREHENSIVE METABOLIC PANEL - Abnormal; Notable for the following components:   Sodium 132 (*)    Potassium 6.1 (*)    CO2 17 (*)    Glucose, Bld 108 (*)    BUN 24 (*)     Creatinine, Ser 2.51 (*)    Calcium 8.0 (*)    Albumin 3.0 (*)    GFR calc non Af Amer 24 (*)    GFR calc Af Amer 28 (*)    All other components within normal limits  BASIC METABOLIC PANEL - Abnormal; Notable for the following components:   Sodium 132 (*)    Potassium 5.5 (*)    CO2 18 (*)    Creatinine, Ser 2.43 (*)    Calcium 7.8 (*)  GFR calc non Af Amer 25 (*)    GFR calc Af Amer 29 (*)    All other components within normal limits  I-STAT VENOUS BLOOD GAS, ED - Abnormal; Notable for the following components:   pCO2, Ven 33.1 (*)    pO2, Ven 23.0 (*)    Bicarbonate 18.6 (*)    TCO2 20 (*)    Acid-base deficit 6.0 (*)    All other components within normal limits  CULTURE, OB URINE  GASTROINTESTINAL PANEL BY PCR, STOOL (REPLACES STOOL CULTURE)  STOOL CULTURE  C DIFFICILE QUICK SCREEN W PCR REFLEX  LIPASE, BLOOD  TROPONIN I  CREATININE, URINE, RANDOM  SODIUM, URINE, RANDOM  ANTINUCLEAR ANTIBODIES, IFA  MPO/PR-3 (ANCA) ANTIBODIES  C4 COMPLEMENT  C3 COMPLEMENT  COMPLEMENT, TOTAL  GLOMERULAR BASEMENT MEMBRANE ANTIBODIES  ANTISTREPTOLYSIN O TITER  ANTI-DNA ANTIBODY, DOUBLE-STRANDED  SODIUM, URINE, RANDOM  CREATININE, URINE, RANDOM  RENAL FUNCTION PANEL  RENAL FUNCTION PANEL  CBC  NOROVIRUS GROUP 1 & 2 BY PCR, STOOL  IRON AND TIBC  FERRITIN  VITAMIN B12  FOLATE RBC  HAPTOGLOBIN  LACTATE DEHYDROGENASE  BLOOD GAS, VENOUS  TROPONIN I  TROPONIN I    EKG None  Radiology Dg Chest 2 View  Result Date: 09/20/2018 CLINICAL DATA:  Central chest pain EXAM: CHEST - 2 VIEW COMPARISON:  07/28/2018 FINDINGS: Mild cardiomegaly. No confluent opacities, effusions or edema. No acute bony abnormality. IMPRESSION: Mild cardiomegaly.  No active disease. Electronically Signed   By: Rolm Baptise M.D.   On: 09/20/2018 21:32   [place] Mfm Ob Limited  Result Date: 09/20/2018 ----------------------------------------------------------------------  OBSTETRICS REPORT                        (Signed Final 09/20/2018 03:28 pm) ---------------------------------------------------------------------- Patient Info  ID #:       [id number]                          D.O.B.:  01-04-1981 (36 yrs)  Name:       Cathy [name]                Visit Date: 09/20/2018 02:18 pm ---------------------------------------------------------------------- Performed By  Performed By:     Novella Rob        Referred By:      Irene Limbo  Attending:        Tama High MD        Location:         [facility] ---------------------------------------------------------------------- Orders   #  Description                          Code         Ordered By   1  [place] MFM OB LIMITED                    74259.56     Aldona Bar  WEINHOLD  ----------------------------------------------------------------------   #  Order #                    Accession #                 Episode #   1  [id number]                  [id number]                  [id number]  ---------------------------------------------------------------------- Indications   [redacted] weeks gestation of pregnancy                Z3A.15   Abdominal pain in pregnancy (diarrhea x 2      O99.89   weeks)  ---------------------------------------------------------------------- Vital Signs  Weight (lb): 145                               Height:        5'7"  BMI:         22.71 ---------------------------------------------------------------------- Fetal Evaluation  Num Of Fetuses:         1  Fetal Heart Rate(bpm):  148  Cardiac Activity:       Observed  Presentation:           Cephalic  Placenta:               Posterior  Amniotic Fluid  AFI FV:      Within normal limits                              Largest Pocket(cm)                              6.32 ---------------------------------------------------------------------- OB History  Gravidity:    6         Term:   4         SAB:   1   Living:       4 ---------------------------------------------------------------------- Gestational Age  LMP:           12w 6d        Date:  06/22/18                 EDD:   03/29/19  Best:          15w 6d     Det. By:  Previous Ultrasound      EDD:   03/08/19 ---------------------------------------------------------------------- Cervix Uterus Adnexa  Cervix  Length:           3.56  cm.  Normal appearance by transabdominal scan. ---------------------------------------------------------------------- Impression  Patient was evaluated for abdominal pain and diarrhea.  A limited ultrasound study was performed. Amniotic fluid is  normal and good fetal activity is seen. On transabdominal  scan, the cervix looks long and closed. ----------------------------------------------------------------------                  Tama High, MD Electronically Signed Final Report   09/20/2018 03:28 pm ----------------------------------------------------------------------   Procedures Procedures (including critical care time)  Medications Ordered in ED Medications  sodium bicarbonate 150 mEq in sterile water 1,000 mL infusion ( Intravenous New Bag/Given 09/20/18 1721)  patiromer Daryll Drown) packet 8.4 g (8.4 g Oral Given 09/20/18 2042)  prenatal vitamin w/FE, FA (NATACHEW) chewable tablet 1 tablet (has  no administration in time range)  albuterol (PROVENTIL) (2.5 MG/3ML) 0.083% nebulizer solution 2.5 mg (has no administration in time range)     Initial Impression / Assessment and Plan / ED Course  I have reviewed the triage vital signs and the nursing notes.  Pertinent labs & imaging results that were available during my care of the patient were reviewed by me and considered in my medical decision making (see chart for details).    Final Clinical Impressions(s) / ED Diagnoses   Final diagnoses:  AKI (acute kidney injury) ([place])  Anemia, unspecified type  Diarrhea, unspecified type   Pt presenting from [facility]  hospital for evaluation of AKI and hyperkalemia in setting of SLE, diarrhea, decreased po intake. Vitals stable, afebrile. Nephrology was consulted by [facility] who recommend transfer to [facility] and admission to the hospital.   Labs from [facility] hospital reviewed and cbc was without leukocytosis. hgb 8.0 worse than prior labs from 2 months ago. Denies melena or hematochezia. H/o anemia but not on iron. CMP with elevated creatinine at 2.44. Elevated BUN to 24. Potassium of 7. EKG without piqued t-waves or other acute changes. [place] completed at [facility] hospital with normal fetal movement.  pt is also c/o chest pain and sob. nonpleuritic cp. H/o asthma. No LE swelling on exam.  Has systolic murmur that she was made aware of 2 months ago when dx with SLE, unclear if this is new. Denies h/o IVDU.   Pt will require admission for IVF hydration and further monitoring.   Discussed pt case with Dr. Roel Cluck who personally evaluated the pt and accepts pt for admission.  ED Discharge Orders    None       Rodney Booze, PA-C 09/20/18 2255    Rodney Booze, PA-C 09/20/18 2257    Jola Schmidt, MD 09/20/18 2310

## 2018-09-20 NOTE — ED Notes (Signed)
ED Provider at bedside. 

## 2018-09-20 NOTE — ED Notes (Signed)
Pt aware that urine and stool samples are needed, pt will let this RN know when she is able to provide them.

## 2018-09-20 NOTE — Consult Note (Addendum)
Reason for Consult: AKI and hyperkalemia Referring Physician: Rip Harbour, MD  Cathy [name] is an 37 y.o. female.  HPI: Cathy [name] is 37 yo AAF with PMH sig for bipolar disorder, recent diagnosis of SLE, and [redacted] weeks gestation who presented to [facility] ED with a 2 week history of nausea, anorexia, and diarrhea.  She admits to have abdominal cramping but denies any hematochezia, melena, or BRBPR.  She also denies any dysuria, pyuria, hematuria, urgency, frequency, or retention.  She is not taking any medications and is currently residing in an homeless shelter with her young son Aiden.   Workup in the ED revealed a K of 7.3 (repeat of 6.1) and a BUN/Cr of 24/2.51, CO2 of 17, Na of 132, Alb 3, Ca 8.  Heer UA revealed large blood and 100 mg/dL of protein and trace leukocytes.  We were consulted to further evaluate and manage her AKI and electrolyte abnormalities.  The trend in Scr is seen below.   Her lupus was diagnosed approximately 2 months ago when she presented with a malar rash.  Her son was born with Lupus and she denies a family history of Lupus.  She did have a maternal grandfather who was on dialysis presumably from DM and HTN.    She does admit to swelling of her feet and hands as well as soreness in her ankles.  She denies any NSAIDs or COX-II I's but was taking them in August when she had a headache and rash.  The trend in Cr is seen below.  Trend in Creatinine: Creatinine, Ser  Date/Time Value Ref Range Status  09/20/2018 02:41 PM 2.51 (H) 0.44 - 1.00 mg/dL Final  09/20/2018 01:23 PM 2.44 (H) 0.44 - 1.00 mg/dL Final  07/20/2018 02:43 PM 0.60 0.44 - 1.00 mg/dL Final  01/10/2017 08:13 PM 0.99 0.44 - 1.00 mg/dL Final  09/01/2016 08:08 PM 0.86 0.44 - 1.00 mg/dL Final  04/13/2016 06:24 PM 0.91 0.44 - 1.00 mg/dL Final  10/10/2015 01:48 PM 0.82 0.44 - 1.00 mg/dL Final  03/13/2015 08:16 PM 0.99 0.50 - 1.10 mg/dL Final  05/01/2014 03:37 PM 0.79 0.50 - 1.10 mg/dL Final  04/11/2014 04:46  PM 0.87 0.50 - 1.10 mg/dL Final  05/18/2007 11:59 AM 0.56  Final    PMH:   Past Medical History:  Diagnosis Date  . Anxiety   . Asthma   . Bipolar depression ([place])   . Depression   . Gonorrhea   . Lupus ([place])   . Pelvic inflammatory disease (PID)   . Trichomonas infection     PSH:   Past Surgical History:  Procedure Laterality Date  . NO PAST SURGERIES      Allergies:  Allergies  Allergen Reactions  . Latex Rash    Medications:   Prior to Admission medications   Medication Sig Start Date End Date Taking? Authorizing Provider  ibuprofen (ADVIL,MOTRIN) 200 MG tablet Take 400 mg by mouth daily as needed for headache.    [provider]  loratadine (CLARITIN) 10 MG tablet Take 1 tablet (10 mg total) by mouth daily. 07/28/18   Lanae Boast, FNP    Inpatient medications: . patiromer  8.4 g Oral Daily    Discontinued Meds:  There are no discontinued medications.  Social History:  reports that she has been smoking cigarettes. She has been smoking about 0.25 packs per day. She has never used smokeless tobacco. She reports that she drank alcohol. She reports that she has current or past drug history.  Drug: Marijuana.  Family History:   Family History  Problem Relation Age of Onset  . Diabetes Maternal Grandmother   . Hypertension Maternal Grandmother   . Diabetes Maternal Grandfather   . Hypertension Maternal Grandfather   . Diabetes Paternal Grandmother   . Hypertension Paternal Grandmother   . Diabetes Paternal Grandfather   . Hypertension Paternal Grandfather     Pertinent items are noted in HPI. Weight change:  No intake or output data in the 24 hours ending 09/20/18 1654 BP 131/74 (BP Location: Right Arm)   Pulse 64   Temp 98.1 F (36.7 C) (Oral)   Resp 18   Wt 65.9 kg   LMP 06/22/2018   SpO2 100%   BMI 22.75 kg/m  Vitals:   09/20/18 1224  BP: 131/74  Pulse: 64  Resp: 18  Temp: 98.1 F (36.7 C)  TempSrc: Oral  SpO2: 100%  Weight:  65.9 kg     General appearance: alert, cooperative and no distress Head: Normocephalic, without obvious abnormality, atraumatic Eyes: negative findings: lids and lashes normal, conjunctivae and sclerae normal and corneas clear Resp: clear to auscultation bilaterally Cardio: regular rate and rhythm, S1, S2 normal, no murmur, click, rub or gallop GI: distended, with enlarged uterus, +BS, soft, diffuse tenderness to palpation without rebound, worse in lower quadrants Extremities: extremities normal, atraumatic, no cyanosis or edema  Labs: Basic Metabolic Panel: Recent Labs  Lab 09/20/18 1323 09/20/18 1441  NA 131* 132*  K 7.0* 6.1*  CL 110 110  CO2 17* 17*  GLUCOSE 81 108*  BUN 24* 24*  CREATININE 2.44* 2.51*  ALBUMIN 3.0* 3.0*  CALCIUM 8.1* 8.0*   Liver Function Tests: Recent Labs  Lab 09/20/18 1323 09/20/18 1441  AST 27 27  ALT 37 36  ALKPHOS 57 56  BILITOT 0.4 0.4  PROT 7.5 7.6  ALBUMIN 3.0* 3.0*   Recent Labs  Lab 09/20/18 1323  LIPASE 28   No results for input(s): AMMONIA in the last 168 hours. CBC: Recent Labs  Lab 09/20/18 1323  WBC 6.1  NEUTROABS 4.9  HGB 8.0*  HCT 22.3*  MCV 83.8  PLT 221   PT/INR: @LABRCNTIP (inr:5) Cardiac Enzymes: )No results for input(s): CKTOTAL, CKMB, CKMBINDEX, TROPONINI in the last 168 hours. CBG: No results for input(s): GLUCAP in the last 168 hours.  Iron Studies: No results for input(s): IRON, TIBC, TRANSFERRIN, FERRITIN in the last 168 hours.  Xrays/Other Studies: [place] Mfm Ob Limited  Result Date: 09/20/2018 ----------------------------------------------------------------------  OBSTETRICS REPORT                       (Signed Final 09/20/2018 03:28 pm) ---------------------------------------------------------------------- Patient Info  ID #:       [id number]                          D.O.B.:  [birth year] (36 yrs)  Name:       Cathy [name]                Visit Date: 09/20/2018 02:18 pm  ---------------------------------------------------------------------- Performed By  Performed By:     Novella Rob        Referred By:      Crissie Figures                    RDMS  [place]  Attending:        Tama High MD        Location:         [facility] ---------------------------------------------------------------------- Orders   #  Description                          Code         Ordered By   1  [place] MFM OB LIMITED                    96789.38     Mallie Snooks  ----------------------------------------------------------------------   #  Order #                    Accession #                 Episode #   1  [id number]                  [id number]                  [id number]  ---------------------------------------------------------------------- Indications   [redacted] weeks gestation of pregnancy                Z3A.15   Abdominal pain in pregnancy (diarrhea x 2      O99.89   weeks)  ---------------------------------------------------------------------- Vital Signs  Weight (lb): 145                               Height:        5'7"  BMI:         22.71 ---------------------------------------------------------------------- Fetal Evaluation  Num Of Fetuses:         1  Fetal Heart Rate(bpm):  148  Cardiac Activity:       Observed  Presentation:           Cephalic  Placenta:               Posterior  Amniotic Fluid  AFI FV:      Within normal limits                              Largest Pocket(cm)                              6.32 ---------------------------------------------------------------------- OB History  Gravidity:    6         Term:   4         SAB:   1  Living:       4 ---------------------------------------------------------------------- Gestational Age  LMP:           12w 6d        Date:  06/22/18                 EDD:   03/29/19  Best:          15w 6d     Det. By:  Previous  Ultrasound      EDD:   03/08/19  ---------------------------------------------------------------------- Cervix Uterus Adnexa  Cervix  Length:           3.56  cm.  Normal appearance by transabdominal scan. ---------------------------------------------------------------------- Impression  Patient was evaluated for abdominal pain and diarrhea.  A limited ultrasound study was performed. Amniotic fluid is  normal and good fetal activity is seen. On transabdominal  scan, the cervix looks long and closed. ----------------------------------------------------------------------                  Tama High, MD Electronically Signed Final Report   09/20/2018 03:28 pm ----------------------------------------------------------------------    Assessment/Plan: 1.  AKI- in setting of [redacted] weeks gestation and history of Lupus as well as N/anorexia and diarrhea.  Hopefully this is all pre-renal, however given the blood and protein the most concerning diagnosis would be a lupus flare.  Will check serologies and complements, renal US, and FeNa.  Will also start IVF's and follow renal function closely.  She may require a renal biopsy if her renal function continues to worsen. 2. Hyperkalemia- due to AKI.  Will start isotonic bicarb and veltassa and follow.  Would also recommend insulin/D50. 3. SLE- diagnosed in August 2019 not on any medications 4. [redacted] weeks gestation- per OB 5. Diarrhea- will send off stool cultures and some viral studies.  No recent abx. 6. Anemia- significant drop over the past 2 months.  Guaiac stools and follow H/H  7. Disposition- given her AKI and hyperkalemia, it would be wise to admit her to [facility] in case she will require renal biopsy and/or dialysis.  She is amenable for the transfer.  Currently residing in an homeless shelter.   Governor Rooks Jolea Dolle 09/20/2018, 4:54 PM

## 2018-09-20 NOTE — H&P (Addendum)
Cathy [name]:[id number] DOB: [birth year] DOA: 09/20/2018     PCP: Patient, No Pcp Per   Outpatient Specialists:  NONE    Patient arrived to ER on 09/20/18 at 1208  Patient coming from: homeless, staying at homeless shelter with her son is currently attending school Chief Complaint:  Chief Complaint  Patient presents with  . Diarrhea    HPI: Cathy [name] is a 37 y.o. female with medical history significant of bipolar disorder, recent diagnosis of SLE, homelessness, PID, history of gonorrhea and trichomonas infection, depression    Presented with   a week history of nausea vomiting diarrhea abdominal pain prepubic abdominal pain [facility] she is currently [redacted] weeks pregnant.  Reported chest pain and shortness of breath this been ongoing for the past 2 weeks reports pain lasts 30 min at a time sometimes few hours occurs at rest non pleuritic she is unsure makes it better or worse States she has not eaten much for the past few weeks secondary to persistent nausea vomiting diarrhea. Patient reporting headache.   regarding pertinent Chronic problems:  Weakness of lupus 2 months ago denied any medications  While in ER: Initial potassium 7.3 down to 6.1 creatinine up to 2.51 UA shows large amount of blood Ultrasound showed normal fetal movement Nephrology was consulted recommended transfer to ER She was transferred from [facility] hospital to [facility] She was started on sodium bicarb drip  The following Work up has been ordered so far:  Orders Placed This Encounter  Procedures  . Culture, OB Urine  . Gastrointestinal Panel by PCR , Stool  . Stool culture (children & immunocomp patients)  . C difficile quick scan w PCR reflex  . [place] MFM OB LIMITED  . Urinalysis, Routine w reflex microscopic  . CBC with Differential/Platelet  . Comprehensive metabolic panel  . Lipase, blood  . Rapid urine drug screen (hospital performed)  .  Comprehensive metabolic panel  . ANA, IFA (with reflex)  . Mpo/pr-3 ([place]) antibodies  . C4 complement  . C3 complement  . Complement, total  . Glomerular basement membrane antibodies  . Antistreptolysin O titer  . Anti-DNA antibody, double-stranded  . Sodium, urine, random  . Creatinine, urine, random  . Renal function panel  . Renal function panel  . CBC  . Norovirus group 1 & 2 by PCR, stool  . Iron and TIBC  . Ferritin  . Vitamin B12  . Folate RBC  . Haptoglobin  . Lactate dehydrogenase  . Diet regular Room service appropriate? Yes; Fluid consistency: Thin  . Consult for [facility] Admission  . Enteric precautions (UV disinfection)  . ED EKG   Following Medications were ordered in ER: Medications  sodium bicarbonate 150 mEq in sterile water 1,000 mL infusion ( Intravenous New Bag/Given 09/20/18 1721)  patiromer Daryll Drown) packet 8.4 g (has no administration in time range)  prenatal vitamin w/FE, FA (NATACHEW) chewable tablet 1 tablet (has no administration in time range)    Significant initial  Findings: Abnormal Labs Reviewed  URINALYSIS, ROUTINE W REFLEX MICROSCOPIC - Abnormal; Notable for the following components:      Result Value   APPearance HAZY (*)    Hgb urine dipstick LARGE (*)    Protein, ur 100 (*)    Leukocytes, UA TRACE (*)    Bacteria, UA RARE (*)    All other components within normal limits  CBC WITH DIFFERENTIAL/PLATELET - Abnormal; Notable for the following components:  RBC 2.66 (*)    Hemoglobin 8.0 (*)    HCT 22.3 (*)    All other components within normal limits  COMPREHENSIVE METABOLIC PANEL - Abnormal; Notable for the following components:   Sodium 131 (*)    Potassium 7.0 (*)    CO2 17 (*)    BUN 24 (*)    Creatinine, Ser 2.44 (*)    Calcium 8.1 (*)    Albumin 3.0 (*)    GFR calc non Af Amer 25 (*)    GFR calc Af Amer 29 (*)    Anion gap 4 (*)    All other components within normal limits  RAPID URINE DRUG SCREEN, HOSP  PERFORMED - Abnormal; Notable for the following components:   Tetrahydrocannabinol POSITIVE (*)    All other components within normal limits  COMPREHENSIVE METABOLIC PANEL - Abnormal; Notable for the following components:   Sodium 132 (*)    Potassium 6.1 (*)    CO2 17 (*)    Glucose, Bld 108 (*)    BUN 24 (*)    Creatinine, Ser 2.51 (*)    Calcium 8.0 (*)    Albumin 3.0 (*)    GFR calc non Af Amer 24 (*)    GFR calc Af Amer 28 (*)    All other components within normal limits     Na 132 K 6.1 CO2 17  Cr    Up from baseline see below Lab Results  Component Value Date   CREATININE 2.51 (H) 09/20/2018   CREATININE 2.44 (H) 09/20/2018   CREATININE 0.60 07/20/2018      WBC  6.1  HG/HCT     Component Value Date/Time   HGB 8.0 (L) 09/20/2018 1323   HCT 22.3 (L) 09/20/2018 1323    Lactic Acid, Venous    Component Value Date/Time   LATICACIDVEN 1.37 01/10/2017 2043    UA elevated white blood cells and red blood cells   ECG:  Personally reviewed by me showing: HR : 65 Rhythm:  NSR,     no evidence of ischemic changes QTC 420    ED Triage Vitals  Enc Vitals Group     BP 09/20/18 1224 131/74     Pulse Rate 09/20/18 1224 64     Resp 09/20/18 1224 18     Temp 09/20/18 1224 98.1 F (36.7 C)     Temp Source 09/20/18 1224 Oral     SpO2 09/20/18 1224 100 %     Weight 09/20/18 1224 145 lb 4 oz (65.9 kg)     Height --      Head Circumference --      Peak Flow --      Pain Score 09/20/18 1222 10     Pain Loc --      Pain Edu? --      Excl. in [place]? --   TMAX(24)@       Latest  Blood pressure 128/83, pulse 64, temperature 98.6 F (37 C), temperature source Oral, resp. rate 20, weight 65.9 kg, last menstrual period 06/22/2018, SpO2 100 %.    [facility]     Nephrology [place]  They Recommend admit to hospitalist Order Veltassa and started on bicarb drip  Will see in ER  Hospitalist was called for admission for acute renal failure  in the setting of nausea vomiting diarrhea   Review of Systems:    Pertinent positives include:  fatigue,  abdominal pain, nausea, vomiting, headaches, chest pain,shortness  of breath at rest.  Constitutional:  No weight loss, night sweats, Fevers, chills,weight loss  HEENT:  No Difficulty swallowing,Tooth/dental problems,Sore throat,  No sneezing, itching, ear ache, nasal congestion, post nasal drip,  Cardio-vascular:  No  Orthopnea, PND, anasarca, dizziness, palpitations.no Bilateral lower extremity swelling  GI:  No heartburn, indigestion,diarrhea, change in bowel habits, loss of appetite, melena, blood in stool, hematemesis Resp:  no  No dyspnea on exertion, No excess mucus, no productive cough, No non-productive cough, No coughing up of blood.No change in color of mucus.No wheezing. Skin:  no rash or lesions. No jaundice GU:  no dysuria, change in color of urine, no urgency or frequency. No straining to urinate.  No flank pain.  Musculoskeletal:  No joint pain or no joint swelling. No decreased range of motion. No back pain.  Psych:  No change in mood or affect. No depression or anxiety. No memory loss.  Neuro: no localizing neurological complaints, no tingling, no weakness, no double vision, no gait abnormality, no slurred speech, no confusion  All systems reviewed and apart from [place] all are negative  Past Medical History:   Past Medical History:  Diagnosis Date  . Anxiety   . Asthma   . Bipolar depression ([place])   . Depression   . Gonorrhea   . Lupus ([place])   . Pelvic inflammatory disease (PID)   . Trichomonas infection        Past Surgical History:  Procedure Laterality Date  . NO PAST SURGERIES      Social History:  Ambulatory independently     reports that she has been smoking cigarettes. She has been smoking about 0.25 packs per day. She has never used smokeless tobacco. She reports that she drank alcohol. She reports that she has current or past drug  history. Drug: Marijuana.     Family History:  Family History  Problem Relation Age of Onset  . Diabetes Maternal Grandmother   . Hypertension Maternal Grandmother   . Diabetes Maternal Grandfather   . Hypertension Maternal Grandfather   . Diabetes Paternal Grandmother   . Hypertension Paternal Grandmother   . Diabetes Paternal Grandfather   . Hypertension Paternal Grandfather     Allergies: Allergies  Allergen Reactions  . Latex Rash     Prior to Admission medications   Medication Sig Start Date End Date Taking? Authorizing Provider  ibuprofen (ADVIL,MOTRIN) 200 MG tablet Take 400 mg by mouth daily as needed for headache.    [provider]  loratadine (CLARITIN) 10 MG tablet Take 1 tablet (10 mg total) by mouth daily. 07/28/18   Lanae Boast, FNP   Physical Exam: Blood pressure 128/83, pulse 64, temperature 98.6 F (37 C), temperature source Oral, resp. rate 20, weight 65.9 kg, last menstrual period 06/22/2018, SpO2 100 %. 1. General:  in No Acute distress  well  -appearing 2. Psychological: Alert and   Oriented 3. Head/ENT:     Dry Mucous Membranes                          Head Non traumatic, neck supple                           Poor Dentition 4. SKIN:  decreased Skin turgor,  Skin clean Dry and intact no rash 5. Heart: Regular rate and rhythm no  Murmur, no Rub or gallop 6. Lungs:   no wheezes or crackles  7. Abdomen: Soft,  non-tender, GRAVID    bowel sounds present 8. Lower extremities: no clubbing, cyanosis, or  edema 9. Neurologically Grossly intact, moving all 4 extremities equally  10. MSK: Normal range of motion   LABS:     Recent Labs  Lab 09/20/18 1323  WBC 6.1  NEUTROABS 4.9  HGB 8.0*  HCT 22.3*  MCV 83.8  PLT 182   Basic Metabolic Panel: Recent Labs  Lab 09/20/18 1323 09/20/18 1441  NA 131* 132*  K 7.0* 6.1*  CL 110 110  CO2 17* 17*  GLUCOSE 81 108*  BUN 24* 24*  CREATININE 2.44* 2.51*  CALCIUM 8.1* 8.0*       Recent Labs  Lab 09/20/18 1323 09/20/18 1441  AST 27 27  ALT 37 36  ALKPHOS 57 56  BILITOT 0.4 0.4  PROT 7.5 7.6  ALBUMIN 3.0* 3.0*   Recent Labs  Lab 09/20/18 1323  LIPASE 28   No results for input(s): AMMONIA in the last 168 hours.    HbA1C: No results for input(s): HGBA1C in the last 72 hours. CBG: No results for input(s): GLUCAP in the last 168 hours.    Urine analysis:    Component Value Date/Time   COLORURINE YELLOW 09/20/2018 1238   APPEARANCEUR HAZY (A) 09/20/2018 1238   LABSPEC 1.010 09/20/2018 1238   PHURINE 6.0 09/20/2018 1238   GLUCOSEU NEGATIVE 09/20/2018 1238   HGBUR LARGE (A) 09/20/2018 1238   BILIRUBINUR NEGATIVE 09/20/2018 1238   KETONESUR NEGATIVE 09/20/2018 1238   PROTEINUR 100 (A) 09/20/2018 1238   UROBILINOGEN 1.0 05/01/2014 1903   NITRITE NEGATIVE 09/20/2018 1238   LEUKOCYTESUR TRACE (A) 09/20/2018 1238    Cultures:    Component Value Date/Time   SDES URINE, CLEAN CATCH 04/11/2014 1455   SPECREQUEST NONE 04/11/2014 1455   CULT  04/11/2014 1455    ESCHERICHIA COLI Performed at [place] 04/14/2014 FINAL 04/11/2014 1455     Radiological Exams on Admission: [place] Mfm Ob Limited  Result Date: 09/20/2018 ----------------------------------------------------------------------  OBSTETRICS REPORT                       (Signed Final 09/20/2018 03:28 pm) ---------------------------------------------------------------------- Patient Info  ID #:       [id number]                          D.O.B.:  10/06/1981 (36 yrs)  Name:       Simonne Martinet Parkinson                Visit Date: 09/20/2018 02:18 pm ---------------------------------------------------------------------- Performed By  Performed By:     Novella Rob        Referred By:      Irene Limbo  Attending:        Tama High MD        Location:         [facility]  ---------------------------------------------------------------------- Orders   #  Description  Code         Ordered By   1  [place] MFM OB LIMITED                    61607.37     Mallie Snooks  ----------------------------------------------------------------------   #  Order #                    Accession #                 Episode #   1  [id number]                  [id number]                  [id number]  ---------------------------------------------------------------------- Indications   [redacted] weeks gestation of pregnancy                Z3A.15   Abdominal pain in pregnancy (diarrhea x 2      O99.89   weeks)  ---------------------------------------------------------------------- Vital Signs  Weight (lb): 145                               Height:        5'7"  BMI:         22.71 ---------------------------------------------------------------------- Fetal Evaluation  Num Of Fetuses:         1  Fetal Heart Rate(bpm):  148  Cardiac Activity:       Observed  Presentation:           Cephalic  Placenta:               Posterior  Amniotic Fluid  AFI FV:      Within normal limits                              Largest Pocket(cm)                              6.32 ---------------------------------------------------------------------- OB History  Gravidity:    6         Term:   4         SAB:   1  Living:       4 ---------------------------------------------------------------------- Gestational Age  LMP:           12w 6d        Date:  06/22/18                 EDD:   03/29/19  Best:          15w 6d     Det. By:  Previous Ultrasound      EDD:   03/08/19 ---------------------------------------------------------------------- Cervix Uterus Adnexa  Cervix  Length:           3.56  cm.  Normal appearance by transabdominal scan. ---------------------------------------------------------------------- Impression  Patient was evaluated for abdominal pain and diarrhea.  A  limited ultrasound study was performed. Amniotic fluid is  normal and good  fetal activity is seen. On transabdominal  scan, the cervix looks long and closed. ----------------------------------------------------------------------                  Tama High, MD Electronically Signed Final Report   09/20/2018 03:28 pm ----------------------------------------------------------------------   Chart has been reviewed    Assessment/Plan   37 y.o. female with medical history significant of bipolar disorder, recent diagnosis of SLE, homelessness, PID, history of gonorrhea and trichomonas infection, depression  Admitted for acute renal failure and dehydration in the setting of recent history of nausea vomiting diarrhea and diagnosis of SLE  Present on Admission: . Lupus ([place]) unclear if currently renal function worsening secondary to lupus exacerbation.  Appreciate nephrology consult.  Complements levels ordered.  . Acute renal failure (ARF) (HCC) -in the setting of dehydration although worsening SLE is a possibility.  Appreciate nephrology consult per nephrology recommendations currently on bicarb drip Hyperkalemia currently improving while being treated with IV bicarbonate and Veltassa to need to follow frequent labs . Asthma -albuterol as needed currently appears to be stable . Chest pain -moderate cardiomegaly no active disease cardiac enzymes unremarkable, given nonpleuritic chest pain that is constant and mild over the past few hours PE being less likely.  Given cardiomegaly and SLE will obtain echogram to evaluate  for pericardial effusion . Substance abuse ([place]) -marijuana abuse would recommend avoiding while pregnant This less have consulted social worker who will evaluate patient for her needs Nausea vomiting diarrhea will obtain gastric panel to evaluate for viral source Pregnancy  -per ultrasound done today patient is [redacted] weeks pregnant.  Currently appears to be doing well.  Will need  follow-up with OB/GYN given high risk Other plan as per orders.  DVT prophylaxis:  SCD    Code Status:  FULL CODE  as per patient   I had personally discussed CODE STATUS with patient    Family Communication:   Family  at  Bedside  plan of care was discussed with cousin  Disposition Plan:    To home once workup is complete and patient is stable                                         Consults called: Nephrology   Admission status:   inpatient     Expect 2 midnight stay secondary to severity of patient's current illness Severe lab abnormalities including acute renal failure and hyperkalemia and extensive comorbidities including: SLE That are currently affecting medical management.  I expect  patient to be hospitalized for 2 midnights requiring inpatient medical care.  Patient is at high risk for adverse outcome (such as loss of life or disability) if not treated.  Indication for inpatient stay as follows:      inability to maintain oral hydration   persistent chest pain despite medical management   Need for IV fluids , IV medications    Level of care       SDU tele indefinitely please discontinue once patient no longer qualifies        Praneeth Bussey 09/20/2018, 10:44 PM    [facility]  Pager [phone]   after 2 AM please page floor coverage PA If 7AM-7PM, please contact the day team taking care of the patient  [email or web address]  Password [email or web address]

## 2018-09-20 NOTE — ED Notes (Signed)
Pt ambulatory to bathroom, reports intense pain upon urination and has visible "muscle spasms" in her legs after. Pain subsides when lying back down.

## 2018-09-20 NOTE — MAU Provider Note (Signed)
History     CSN: [id number]  Arrival date and time: 09/20/18 1208   First Provider Initiated Contact with Patient 09/20/18 1300      No chief complaint on file.  HPI  Cathy [name] is a 37 y.o. [id number] at [redacted]w[redacted]d who presents to MAU with chief complaint of diarrhea for the past two weeks, urinary frequency and pain with urination for the past three days. She also c/o numb spots in her mouth.    Patient also complains of abdominal pain. Pain is 5/10, suprapubic, does not radiate. No aggravating or alleviating factors.   Patient is currently living in a homeless shelter with her son. States she has been exposed to many sick people. Also states the coordinators have told her she can stay for an extended period due to her pregnancy.  No prenatal care. Patient states she was diagnosed with lupus two weeks ago and only discovered she was pregnant while being triaged for that. Denies vaginal bleeding, leaking of fluid, decreased fetal movement, fever, falls, or recent illness.    OB History    Gravida  6   Para  4   Term  4   Preterm  0   AB  1   Living  4     SAB  1   TAB  0   Ectopic  0   Multiple  0   Live Births  1           Past Medical History:  Diagnosis Date  . Anxiety   . Asthma   . Bipolar depression ([place])   . Depression   . Gonorrhea   . Lupus ([place])   . Pelvic inflammatory disease (PID)   . Trichomonas infection     Past Surgical History:  Procedure Laterality Date  . NO PAST SURGERIES      Family History  Problem Relation Age of Onset  . Diabetes Maternal Grandmother   . Hypertension Maternal Grandmother   . Diabetes Maternal Grandfather   . Hypertension Maternal Grandfather   . Diabetes Paternal Grandmother   . Hypertension Paternal Grandmother   . Diabetes Paternal Grandfather   . Hypertension Paternal Grandfather     Social History   Tobacco Use  . Smoking status: Current Some Day Smoker    Packs/day: 0.25    Types:  Cigarettes  . Smokeless tobacco: Never Used  Substance Use Topics  . Alcohol use: Not Currently    Frequency: Never  . Drug use: Yes    Types: Marijuana    Comment: 3 times a week     Allergies:  Allergies  Allergen Reactions  . Latex Rash    Medications Prior to Admission  Medication Sig Dispense Refill Last Dose  . ibuprofen (ADVIL,MOTRIN) 200 MG tablet Take 400 mg by mouth daily as needed for headache.   Not Taking  . loratadine (CLARITIN) 10 MG tablet Take 1 tablet (10 mg total) by mouth daily. 30 tablet 2     Review of Systems  Constitutional: Negative for chills, fatigue and fever.  Respiratory: Negative for chest tightness and shortness of breath.   Gastrointestinal: Positive for abdominal pain, diarrhea and nausea. Negative for vomiting.  Genitourinary: Positive for difficulty urinating and dysuria. Negative for flank pain, vaginal bleeding, vaginal discharge and vaginal pain.  Musculoskeletal: Negative for back pain.  Neurological: Positive for headaches. Negative for dizziness, seizures, syncope and weakness.  All other systems reviewed and are negative.  Physical Exam   Blood pressure  131/74, pulse 64, temperature 98.1 F (36.7 C), temperature source Oral, resp. rate 18, weight 65.9 kg, last menstrual period 06/22/2018, SpO2 100 %.  Physical Exam  Nursing note and vitals reviewed. Constitutional: She is oriented to person, place, and time. She appears well-developed and well-nourished.  Cardiovascular: Normal rate, normal heart sounds and intact distal pulses.  Respiratory: Effort normal and breath sounds normal. No respiratory distress. She has no wheezes. She has no rales. She exhibits no tenderness.  GI: Soft. There is tenderness. There is no rebound and no guarding.  Gravid  Neurological: She is alert and oriented to person, place, and time. She has normal reflexes.  Skin: Skin is warm and dry. Rash noted.  Malar rash on face.   Psychiatric: She has a  normal mood and affect. Her behavior is normal. Judgment and thought content normal.    MAU Course  Procedures  MDM  --Hyperkalemia on initial exam and redraw. Lab confirmed samples were not hemolyzed --Normal EKG in MAU --Patient asymptomatic. Denies medications --Discussed HPI and lab results with Dr. Clover Mealy on behalf of Nephrology service.  --Dr. Marval Regal on unit from Nephrology to assess for inpatient management at [facility]. See his note  Patient Vitals for the past 24 hrs:  BP Temp Temp src Pulse Resp SpO2 Weight  09/20/18 1224 131/74 98.1 F (36.7 C) Oral 64 18 100 % 65.9 kg    Results for orders placed or performed during the hospital encounter of 09/20/18 (from the past 24 hour(s))  Urinalysis, Routine w reflex microscopic     Status: Abnormal   Collection Time: 09/20/18 12:38 PM  Result Value Ref Range   Color, Urine YELLOW YELLOW   APPearance HAZY (A) CLEAR   Specific Gravity, Urine 1.010 1.005 - 1.030   pH 6.0 5.0 - 8.0   Glucose, UA NEGATIVE NEGATIVE mg/dL   Hgb urine dipstick LARGE (A) NEGATIVE   Bilirubin Urine NEGATIVE NEGATIVE   Ketones, ur NEGATIVE NEGATIVE mg/dL   Protein, ur 100 (A) NEGATIVE mg/dL   Nitrite NEGATIVE NEGATIVE   Leukocytes, UA TRACE (A) NEGATIVE   RBC / HPF 21-50 0 - 5 RBC/hpf   WBC, UA 11-20 0 - 5 WBC/hpf   Bacteria, UA RARE (A) NONE SEEN   Squamous Epithelial / LPF 6-10 0 - 5   Mucus PRESENT   Rapid urine drug screen (hospital performed)     Status: Abnormal   Collection Time: 09/20/18 12:38 PM  Result Value Ref Range   Opiates NONE DETECTED NONE DETECTED   Cocaine NONE DETECTED NONE DETECTED   Benzodiazepines NONE DETECTED NONE DETECTED   Amphetamines NONE DETECTED NONE DETECTED   Tetrahydrocannabinol POSITIVE (A) NONE DETECTED   Barbiturates NONE DETECTED NONE DETECTED  CBC with Differential/Platelet     Status: Abnormal   Collection Time: 09/20/18  1:23 PM  Result Value Ref Range   WBC 6.1 4.0 - 10.5 K/uL   RBC 2.66 (L) 3.87  - 5.11 MIL/uL   Hemoglobin 8.0 (L) 12.0 - 15.0 g/dL   HCT 22.3 (L) 36.0 - 46.0 %   MCV 83.8 80.0 - 100.0 fL   MCH 30.1 26.0 - 34.0 pg   MCHC 35.9 30.0 - 36.0 g/dL   RDW 12.3 11.5 - 15.5 %   Platelets 221 150 - 400 K/uL   nRBC 0.0 0.0 - 0.2 %   Neutrophils Relative % 80 %   Neutro Abs 4.9 1.7 - 7.7 K/uL   Lymphocytes Relative 16 %   Lymphs Abs  1.0 0.7 - 4.0 K/uL   Monocytes Relative 3 %   Monocytes Absolute 0.2 0.1 - 1.0 K/uL   Eosinophils Relative 1 %   Eosinophils Absolute 0.1 0.0 - 0.5 K/uL   Basophils Relative 0 %   Basophils Absolute 0.0 0.0 - 0.1 K/uL  Comprehensive metabolic panel     Status: Abnormal   Collection Time: 09/20/18  1:23 PM  Result Value Ref Range   Sodium 131 (L) 135 - 145 mmol/L   Potassium 7.0 (HH) 3.5 - 5.1 mmol/L   Chloride 110 98 - 111 mmol/L   CO2 17 (L) 22 - 32 mmol/L   Glucose, Bld 81 70 - 99 mg/dL   BUN 24 (H) 6 - 20 mg/dL   Creatinine, Ser 2.44 (H) 0.44 - 1.00 mg/dL   Calcium 8.1 (L) 8.9 - 10.3 mg/dL   Total Protein 7.5 6.5 - 8.1 g/dL   Albumin 3.0 (L) 3.5 - 5.0 g/dL   AST 27 15 - 41 U/L   ALT 37 0 - 44 U/L   Alkaline Phosphatase 57 38 - 126 U/L   Total Bilirubin 0.4 0.3 - 1.2 mg/dL   GFR calc non Af Amer 25 (L) >60 mL/min   GFR calc Af Amer 29 (L) >60 mL/min   Anion gap 4 (L) 5 - 15  Lipase, blood     Status: None   Collection Time: 09/20/18  1:23 PM  Result Value Ref Range   Lipase 28 11 - 51 U/L  Comprehensive metabolic panel     Status: Abnormal   Collection Time: 09/20/18  2:41 PM  Result Value Ref Range   Sodium 132 (L) 135 - 145 mmol/L   Potassium 6.1 (H) 3.5 - 5.1 mmol/L   Chloride 110 98 - 111 mmol/L   CO2 17 (L) 22 - 32 mmol/L   Glucose, Bld 108 (H) 70 - 99 mg/dL   BUN 24 (H) 6 - 20 mg/dL   Creatinine, Ser 2.51 (H) 0.44 - 1.00 mg/dL   Calcium 8.0 (L) 8.9 - 10.3 mg/dL   Total Protein 7.6 6.5 - 8.1 g/dL   Albumin 3.0 (L) 3.5 - 5.0 g/dL   AST 27 15 - 41 U/L   ALT 36 0 - 44 U/L   Alkaline Phosphatase 56 38 - 126 U/L    Total Bilirubin 0.4 0.3 - 1.2 mg/dL   GFR calc non Af Amer 24 (L) >60 mL/min   GFR calc Af Amer 28 (L) >60 mL/min   Anion gap 5 5 - 15    [place] Mfm Ob Limited  Result Date: 09/20/2018 ----------------------------------------------------------------------  OBSTETRICS REPORT                       (Signed Final 09/20/2018 03:28 pm) ---------------------------------------------------------------------- Patient Info  ID #:       [id number]                          D.O.B.:  08-27-1981 (36 yrs)  Name:       Simonne Martinet Dominski                Visit Date: 09/20/2018 02:18 pm ---------------------------------------------------------------------- Performed By  Performed By:     Novella Rob        Referred By:      Crissie Figures                    RDMS  [place]  Attending:        Tama High MD        Location:         [facility] ---------------------------------------------------------------------- Orders   #  Description                          Code         Ordered By   1  [place] MFM OB LIMITED                    72094.70     Mallie Snooks  ----------------------------------------------------------------------   #  Order #                    Accession #                 Episode #   1  [id number]                  [id number]                  [id number]  ---------------------------------------------------------------------- Indications   [redacted] weeks gestation of pregnancy                Z3A.15   Abdominal pain in pregnancy (diarrhea x 2      O99.89   weeks)  ---------------------------------------------------------------------- Vital Signs  Weight (lb): 145                               Height:        5'7"  BMI:         22.71 ---------------------------------------------------------------------- Fetal Evaluation  Num Of Fetuses:         1  Fetal Heart Rate(bpm):  148  Cardiac Activity:       Observed  Presentation:           Cephalic   Placenta:               Posterior  Amniotic Fluid  AFI FV:      Within normal limits                              Largest Pocket(cm)                              6.32 ---------------------------------------------------------------------- OB History  Gravidity:    6         Term:   4         SAB:   1  Living:       4 ---------------------------------------------------------------------- Gestational Age  LMP:           12w 6d        Date:  06/22/18                 EDD:   03/29/19  Best:          15w 6d     Det. By:  Previous Ultrasound      EDD:   03/08/19 ---------------------------------------------------------------------- Cervix Uterus Adnexa  Cervix  Length:           3.56  cm.  Normal appearance by transabdominal scan. ---------------------------------------------------------------------- Impression  Patient was evaluated for abdominal pain and diarrhea.  A limited ultrasound study was performed. Amniotic fluid is  normal and good fetal activity is seen. On transabdominal  scan, the cervix looks long and closed. ----------------------------------------------------------------------                  Tama High, MD Electronically Signed Final Report   09/20/2018 03:28 pm ----------------------------------------------------------------------   Assessment and Plan  --S/p consult with Dr. Marval Regal from Nephrology team --Transfer to [facility] via [facility] for evaluation of acute kidney injury  Accepting physician Dr. Maryan Rued, ED Attending  Darlina Rumpf, [place] 09/20/2018, 5:32 PM

## 2018-09-20 NOTE — ED Triage Notes (Addendum)
[facility] pt is 15 weeks and 6 days pregnant (G6 P4), reports diarrhea and central CP x 2 weeks, with nausea and inability to eat. Pt states she was diagnosed with lupus 2 months ago, has not had prenatal care and is living in a homeless shelter with her son. Sodium bicarb drip currently infusing, A&O x4.

## 2018-09-20 NOTE — ED Notes (Addendum)
Per pharmacy, give veltassa 3 hours before any other PO meds, IV meds are fine.

## 2018-09-20 NOTE — ED Notes (Addendum)
MD Doutova paged about VBG results- PO2 23.0.

## 2018-09-20 NOTE — ED Notes (Signed)
Patient transported to X-ray 

## 2018-09-21 ENCOUNTER — Other Ambulatory Visit (HOSPITAL_COMMUNITY): Payer: Self-pay

## 2018-09-21 LAB — URINALYSIS, COMPLETE (UACMP) WITH MICROSCOPIC
Bilirubin Urine: NEGATIVE
Glucose, UA: NEGATIVE mg/dL
Ketones, ur: NEGATIVE mg/dL
Leukocytes, UA: NEGATIVE
Nitrite: NEGATIVE
PROTEIN: 100 mg/dL — AB
Specific Gravity, Urine: 1.009 (ref 1.005–1.030)
pH: 9 — ABNORMAL HIGH (ref 5.0–8.0)

## 2018-09-21 LAB — GASTROINTESTINAL PANEL BY PCR, STOOL (REPLACES STOOL CULTURE)

## 2018-09-21 LAB — RESPIRATORY PANEL BY PCR
ADENOVIRUS-RVPPCR: NOT DETECTED
Bordetella pertussis: NOT DETECTED
CORONAVIRUS NL63-RVPPCR: NOT DETECTED
Chlamydophila pneumoniae: NOT DETECTED
Coronavirus 229E: NOT DETECTED
Coronavirus HKU1: NOT DETECTED
Coronavirus OC43: NOT DETECTED
INFLUENZA A-RVPPCR: NOT DETECTED
Influenza B: NOT DETECTED
METAPNEUMOVIRUS-RVPPCR: NOT DETECTED
Mycoplasma pneumoniae: NOT DETECTED
PARAINFLUENZA VIRUS 1-RVPPCR: NOT DETECTED
PARAINFLUENZA VIRUS 2-RVPPCR: NOT DETECTED
PARAINFLUENZA VIRUS 3-RVPPCR: NOT DETECTED
PARAINFLUENZA VIRUS 4-RVPPCR: NOT DETECTED
Respiratory Syncytial Virus: NOT DETECTED
Rhinovirus / Enterovirus: NOT DETECTED

## 2018-09-21 LAB — GLOMERULAR BASEMENT MEMBRANE ANTIBODIES: GBM AB: 3 U (ref 0–20)

## 2018-09-21 LAB — C3 COMPLEMENT: C3 COMPLEMENT: 71 mg/dL — AB (ref 82–167)

## 2018-09-21 LAB — COMPREHENSIVE METABOLIC PANEL
ALBUMIN: 2.4 g/dL — AB (ref 3.5–5.0)
ALK PHOS: 53 U/L (ref 38–126)
ALT: 29 U/L (ref 0–44)
AST: 22 U/L (ref 15–41)
Anion gap: 6 (ref 5–15)
BUN: 17 mg/dL (ref 6–20)
CALCIUM: 8.2 mg/dL — AB (ref 8.9–10.3)
CHLORIDE: 108 mmol/L (ref 98–111)
CO2: 20 mmol/L — AB (ref 22–32)
CREATININE: 2.38 mg/dL — AB (ref 0.44–1.00)
GFR calc Af Amer: 30 mL/min — ABNORMAL LOW (ref >60)
GFR calc non Af Amer: 25 mL/min — ABNORMAL LOW (ref >60)
Glucose, Bld: 99 mg/dL (ref 70–99)
Potassium: 5.1 mmol/L (ref 3.5–5.1)
Sodium: 134 mmol/L — ABNORMAL LOW (ref 135–145)
Total Bilirubin: 0.4 mg/dL (ref 0.3–1.2)
Total Protein: 6.3 g/dL — ABNORMAL LOW (ref 6.5–8.1)

## 2018-09-21 LAB — CULTURE, OB URINE: Culture: NO GROWTH

## 2018-09-21 LAB — CBC
HEMATOCRIT: 21.7 % — AB (ref 36.0–46.0)
Hemoglobin: 7 g/dL — ABNORMAL LOW (ref 12.0–15.0)
MCH: 28.1 pg (ref 26.0–34.0)
MCHC: 32.3 g/dL (ref 30.0–36.0)
MCV: 87.1 fL (ref 80.0–100.0)
NRBC: 0 % (ref 0.0–0.2)
PLATELETS: 217 10*3/uL (ref 150–400)
RBC: 2.49 MIL/uL — ABNORMAL LOW (ref 3.87–5.11)
RDW: 11.8 % (ref 11.5–15.5)
WBC: 4.8 10*3/uL (ref 4.0–10.5)

## 2018-09-21 LAB — MPO/PR-3 (ANCA) ANTIBODIES
ANCA Proteinase 3: <3.5 U/mL (ref 0.0–3.5)
Myeloperoxidase Abs: <9 U/mL (ref 0.0–9.0)

## 2018-09-21 LAB — ANTISTREPTOLYSIN O TITER: ASO: 54 [IU]/mL (ref 0.0–200.0)

## 2018-09-21 LAB — FANA STAINING PATTERNS

## 2018-09-21 LAB — COMPLEMENT, TOTAL: COMPL TOTAL (CH50): 52 U/mL (ref 42–999999)

## 2018-09-21 LAB — MAGNESIUM: MAGNESIUM: 1.5 mg/dL — AB (ref 1.7–2.4)

## 2018-09-21 LAB — TSH: TSH: 1.288 u[IU]/mL (ref 0.350–4.500)

## 2018-09-21 LAB — HIV ANTIBODY (ROUTINE TESTING W REFLEX): HIV Screen 4th Generation wRfx: NONREACTIVE

## 2018-09-21 LAB — TROPONIN I: Troponin I: <0.03 ng/mL (ref <0.03)

## 2018-09-21 LAB — MRSA PCR SCREENING: MRSA by PCR: NEGATIVE

## 2018-09-21 LAB — PHOSPHORUS: PHOSPHORUS: 4.8 mg/dL — AB (ref 2.5–4.6)

## 2018-09-21 LAB — ANTINUCLEAR ANTIBODIES, IFA: ANA Ab, IFA: POSITIVE — AB

## 2018-09-21 LAB — C4 COMPLEMENT: COMPLEMENT C4, BODY FLUID: 10 mg/dL — AB (ref 14–44)

## 2018-09-21 LAB — ANTI-DNA ANTIBODY, DOUBLE-STRANDED: ds DNA Ab: 5 IU/mL (ref 0–9)

## 2018-09-21 MED ORDER — ONDANSETRON HCL 4 MG PO TABS: 4.0000 mg | ORAL_TABLET | Freq: Four times a day (QID) | ORAL | Status: DC | PRN

## 2018-09-21 MED ORDER — MAGNESIUM SULFATE 2 GM/50ML IV SOLN
2.0000 g | Freq: Once | INTRAVENOUS | Status: AC
  Administered 2018-09-21: 2 g via INTRAVENOUS
  Filled 2018-09-21: qty 50

## 2018-09-21 MED ORDER — HYDROCODONE-ACETAMINOPHEN 5-325 MG PO TABS: 1.0000 | ORAL_TABLET | ORAL | Status: DC | PRN

## 2018-09-21 MED ORDER — PRENATAL MULTIVITAMIN CH
1.0000 | ORAL_TABLET | Freq: Every day | ORAL | Status: DC
  Administered 2018-09-21 – 2018-09-29 (×9): 1 via ORAL
  Filled 2018-09-21 (×11): qty 1

## 2018-09-21 MED ORDER — HYDROCODONE-ACETAMINOPHEN 5-325 MG PO TABS
1.0000 | ORAL_TABLET | ORAL | Status: DC | PRN
  Administered 2018-09-21: 1 via ORAL
  Administered 2018-09-21 – 2018-09-26 (×9): 2 via ORAL
  Administered 2018-09-27 – 2018-09-28 (×2): 1 via ORAL
  Filled 2018-09-21 (×4): qty 2
  Filled 2018-09-21: qty 1
  Filled 2018-09-21 (×2): qty 2
  Filled 2018-09-21 (×2): qty 1
  Filled 2018-09-21 (×4): qty 2

## 2018-09-21 MED ORDER — ONDANSETRON HCL 4 MG/2ML IJ SOLN: 4.0000 mg | Freq: Four times a day (QID) | INTRAMUSCULAR | Status: DC | PRN

## 2018-09-21 MED ORDER — ACETAMINOPHEN 650 MG RE SUPP: 650.0000 mg | Freq: Four times a day (QID) | RECTAL | Status: DC | PRN

## 2018-09-21 MED ORDER — ACETAMINOPHEN 325 MG PO TABS
650.0000 mg | ORAL_TABLET | Freq: Four times a day (QID) | ORAL | Status: DC | PRN
  Administered 2018-09-21 – 2018-09-28 (×8): 650 mg via ORAL
  Filled 2018-09-21 (×9): qty 2

## 2018-09-21 NOTE — Consult Note (Signed)
Chief Complaint: Patient was seen in consultation today for random renal biopsy Chief Complaint  Patient presents with  . Diarrhea   at the request of Dr Marval Regal   Supervising Physician: Arne Cleveland  Patient Status: [facility] - In-pt  History of Present Illness: Cathy [name] is a 37 y.o. female   [redacted] week gestation Bipolar; Lupus To ED with N/V/D; abd pain; weakness Hyperkalemia; acute kidney injury Cr 2.51 Anemia Hg 7  Worsening renal function Nephrology following Requesting renal bx   Past Medical History:  Diagnosis Date  . Anxiety   . Asthma   . Bipolar depression ([place])   . Depression   . Gonorrhea   . Lupus ([place])   . Pelvic inflammatory disease (PID)   . Trichomonas infection     Past Surgical History:  Procedure Laterality Date  . NO PAST SURGERIES      Allergies: Patient has no known allergies.  Medications: Prior to Admission medications   Medication Sig Start Date End Date Taking? Authorizing Provider  loratadine (CLARITIN) 10 MG tablet Take 1 tablet (10 mg total) by mouth daily. Patient not taking: Reported on 09/20/2018 07/28/18   Lanae Boast, FNP     Family History  Problem Relation Age of Onset  . Diabetes Maternal Grandmother   . Hypertension Maternal Grandmother   . Diabetes Maternal Grandfather   . Hypertension Maternal Grandfather   . Diabetes Paternal Grandmother   . Hypertension Paternal Grandmother   . Diabetes Paternal Grandfather   . Hypertension Paternal Grandfather     Social History   Socioeconomic History  . Marital status: Single    Spouse name: Not on file  . Number of children: Not on file  . Years of education: Not on file  . Highest education level: Not on file  Occupational History  . Not on file  Social Needs  . Financial resource strain: Not on file  . Food insecurity:    Worry: Not on file    Inability: Not on file  . Transportation needs:    Medical: Not on file    Non-medical: Not on  file  Tobacco Use  . Smoking status: Current Some Day Smoker    Packs/day: 0.25    Types: Cigarettes  . Smokeless tobacco: Never Used  Substance and Sexual Activity  . Alcohol use: Not Currently    Frequency: Never  . Drug use: Yes    Types: Marijuana    Comment: 3 times a week   . Sexual activity: Not Currently    Birth control/protection: None  Lifestyle  . Physical activity:    Days per week: Not on file    Minutes per session: Not on file  . Stress: Not on file  Relationships  . Social connections:    Talks on phone: Not on file    Gets together: Not on file    Attends religious service: Not on file    Active member of club or organization: Not on file    Attends meetings of clubs or organizations: Not on file    Relationship status: Not on file  Other Topics Concern  . Not on file  Social History Narrative  . Not on file    Review of Systems: A 12 point ROS discussed and pertinent positives are indicated in the HPI above.  All other systems are negative.  Review of Systems  Constitutional: Positive for activity change and fatigue. Negative for fever.  Respiratory: Negative for cough and shortness  of breath.   Cardiovascular: Negative for chest pain.  Gastrointestinal: Positive for abdominal pain and nausea.  Neurological: Positive for weakness.  Psychiatric/Behavioral: Negative for behavioral problems and confusion.    Vital Signs: BP 134/88   Pulse 73   Temp 98.6 F (37 C) (Oral)   Resp 20   Wt 145 lb 4 oz (65.9 kg)   LMP 06/22/2018   SpO2 100%   BMI 22.75 kg/m   Physical Exam  Constitutional: She is oriented to person, place, and time.  Cardiovascular: Normal rate, regular rhythm and normal heart sounds.  Pulmonary/Chest: Effort normal and breath sounds normal.  Abdominal: Soft. Bowel sounds are normal.  Musculoskeletal: Normal range of motion.  Neurological: She is alert and oriented to person, place, and time.  Skin: Skin is warm and dry.    Psychiatric: She has a normal mood and affect. Her behavior is normal. Judgment and thought content normal.  Vitals reviewed.   Imaging: Dg Chest 2 View  Result Date: 09/20/2018 CLINICAL DATA:  Central chest pain EXAM: CHEST - 2 VIEW COMPARISON:  07/28/2018 FINDINGS: Mild cardiomegaly. No confluent opacities, effusions or edema. No acute bony abnormality. IMPRESSION: Mild cardiomegaly.  No active disease. Electronically Signed   By: Rolm Baptise M.D.   On: 09/20/2018 21:32   [place] Mfm Ob Limited  Result Date: 09/20/2018 ----------------------------------------------------------------------  OBSTETRICS REPORT                       (Signed Final 09/20/2018 03:28 pm) ---------------------------------------------------------------------- Patient Info  ID #:       [id number]                          D.O.B.:  03/04/1981 (36 yrs)  Name:       Cathy [name]                Visit Date: 09/20/2018 02:18 pm ---------------------------------------------------------------------- Performed By  Performed By:     Novella Rob        Referred By:      Irene Limbo  Attending:        Tama High MD        Location:         [facility] ---------------------------------------------------------------------- Orders   #  Description                          Code         Ordered By   1  [place] MFM OB LIMITED                    41638.45     Mallie Snooks  ----------------------------------------------------------------------   #  Order #  Accession #                 Episode #   1  [id number]                  [id number]                  [id number]  ---------------------------------------------------------------------- Indications   [redacted] weeks gestation of pregnancy                Z3A.15   Abdominal pain in pregnancy (diarrhea x 2      O99.89   weeks)   ---------------------------------------------------------------------- Vital Signs  Weight (lb): 145                               Height:        5'7"  BMI:         22.71 ---------------------------------------------------------------------- Fetal Evaluation  Num Of Fetuses:         1  Fetal Heart Rate(bpm):  148  Cardiac Activity:       Observed  Presentation:           Cephalic  Placenta:               Posterior  Amniotic Fluid  AFI FV:      Within normal limits                              Largest Pocket(cm)                              6.32 ---------------------------------------------------------------------- OB History  Gravidity:    6         Term:   4         SAB:   1  Living:       4 ---------------------------------------------------------------------- Gestational Age  LMP:           12w 6d        Date:  06/22/18                 EDD:   03/29/19  Best:          15w 6d     Det. By:  Previous Ultrasound      EDD:   03/08/19 ---------------------------------------------------------------------- Cervix Uterus Adnexa  Cervix  Length:           3.56  cm.  Normal appearance by transabdominal scan. ---------------------------------------------------------------------- Impression  Patient was evaluated for abdominal pain and diarrhea.  A limited ultrasound study was performed. Amniotic fluid is  normal and good fetal activity is seen. On transabdominal  scan, the cervix looks long and closed. ----------------------------------------------------------------------                  Tama High, MD Electronically Signed Final Report   09/20/2018 03:28 pm ----------------------------------------------------------------------   Labs:  CBC: Recent Labs    07/20/18 1443 09/20/18 1323 09/21/18 0251  WBC  --  6.1 4.8  HGB 10.2* 8.0* 7.0*  HCT 30.0* 22.3* 21.7*  PLT  --  221 217    COAGS: No results for input(s): INR, APTT in the last 8760 hours.  BMP: Recent Labs    09/20/18 1323 09/20/18 1441  09/20/18 2043 09/21/18 0251  NA 131* 132* 132* 134*  K 7.0* 6.1* 5.5*  5.1  CL 110 110 109 108  CO2 17* 17* 18* 20*  GLUCOSE 81 108* 90 99  BUN 24* 24* 19 17  CALCIUM 8.1* 8.0* 7.8* 8.2*  CREATININE 2.44* 2.51* 2.43* 2.38*  GFRNONAA 25* 24* 25* 25*  GFRAA 29* 28* 29* 30*    LIVER FUNCTION TESTS: Recent Labs    09/20/18 1323 09/20/18 1441 09/21/18 0251  BILITOT 0.4 0.4 0.4  AST 27 27 22   ALT 37 36 29  ALKPHOS 57 56 53  PROT 7.5 7.6 6.3*  ALBUMIN 3.0* 3.0* 2.4*    TUMOR MARKERS: No results for input(s): AFPTM, CEA, CA199, CHROMGRNA in the last 8760 hours.  Assessment and Plan:  AKI Worsening renal function  Lupus Anemia (hg 7) [redacted] week gestation Scheduled for random renal biopsy Risks and benefits discussed with the patient and mother in room including, but not limited to bleeding, infection, damage to adjacent structures or low yield requiring additional tests.  All of the patient's and family questions were answered, patient is agreeable to proceed. Consent signed and in chart.  Will discuss anemia and pregnancy with IR Rad Tentatively plan for 10/23  Thank you for this interesting consult.  I greatly enjoyed meeting Cathy [name] and look forward to participating in their care.  A copy of this report was sent to the requesting provider on this date.  Electronically Signed: Lavonia Drafts, PA-C 09/21/2018, 3:25 PM   I spent a total of 40 Minutes    in face to face in clinical consultation, greater than 50% of which was counseling/coordinating care for random renal biopsy

## 2018-09-21 NOTE — Progress Notes (Signed)
[place] TEAM 1 - Stepdown/ICU TEAM  Cathy [name]  HKV:[id number] DOB: 1981/06/10 DOA: 09/20/2018 PCP: Patient, No Pcp Per    Brief Narrative:  37 y.o. F w/ a hx ofbipolar disorder, recent diagnosis of SLE, homelessness, PID/gonorrhea and trichomonas infections, and depression who presented w/ a week of nausea vomiting diarrhea and abdominal pain. She initially presented to [facility] as she is currently [redacted] weeks pregnant.  Her evaluation revealed K+ of 7.3, creatinine 2.51, UA w/ large blood. An US showed normal fetal movement. She was transferred to [facility].  Subjective: States she is feeling better in general. Is very hungry and wants to try a diet. Denies cp, vomiting, sob. Reports ongoing generalized HA, and crampy diffuse abdom pain. No diarrhea thus far today, but feels she will have a watery stool soon.   Assessment & Plan:  Acute renal failure  Nephrology following - hopeful this is simply pre-renal - w/u underway - cont to hydrate   Recent Labs  Lab 09/20/18 1323 09/20/18 1441 09/20/18 2043 09/21/18 0251  CREATININE 2.44* 2.51* 2.43* 2.38*    Hyperkalemia  Corrected   Hypomagnesemia  Due to GI losses - replace and follow  Diarrhea  Etiology not clear - studies pending - keep hydrated - follow lytes   Normocytic Anemia  Follow trend closely - ?due to kidney disease, pregnancy, as well as SLE - anemia panel pending   Recently diagnosed SLE Presented w/ malar rash August 2019  Pregnancy - 16 weeks   Bipolar D/O  DVT prophylaxis: SCDs Code Status: FULL CODE Family Communication: spoke w/ mother and aunt at bedside   Disposition Plan: safe for tele bed status   Consultants:  Nephrology   Antimicrobials:  none  Objective: Blood pressure 134/88, pulse 73, temperature 98.6 F (37 C), temperature source Oral, resp. rate 20, weight 65.9 kg, last menstrual period 06/22/2018, SpO2 100 %.  Intake/Output Summary (Last 24 hours) at 09/21/2018  1143 Last data filed at 09/21/2018 0900 Gross per 24 hour  Intake 400 ml  Output -  Net 400 ml   Filed Weights   09/20/18 1224  Weight: 65.9 kg    Examination: General: No acute respiratory distress Lungs: Clear to auscultation bilaterally without wheezes or crackles Cardiovascular: Regular rate and rhythm without murmur gallop or rub normal S1 and S2 Abdomen: gravid, mildly tender diffusely, no rebound, soft  Extremities: No significant cyanosis, clubbing, or edema bilateral lower extremities  CBC: Recent Labs  Lab 09/20/18 1323 09/21/18 0251  WBC 6.1 4.8  NEUTROABS 4.9  --   HGB 8.0* 7.0*  HCT 22.3* 21.7*  MCV 83.8 87.1  PLT 221 564   Basic Metabolic Panel: Recent Labs  Lab 09/20/18 1441 09/20/18 2043 09/21/18 0251 09/21/18 0853  NA 132* 132* 134*  --   K 6.1* 5.5* 5.1  --   CL 110 109 108  --   CO2 17* 18* 20*  --   GLUCOSE 108* 90 99  --   BUN 24* 19 17  --   CREATININE 2.51* 2.43* 2.38*  --   CALCIUM 8.0* 7.8* 8.2*  --   MG  --   --   --  1.5*  PHOS  --   --   --  4.8*   GFR: Estimated Creatinine Clearance: 32.4 mL/min (A) (by C-G formula based on SCr of 2.38 mg/dL (H)).  Liver Function Tests: Recent Labs  Lab 09/20/18 1323 09/20/18 1441 09/21/18 0251  AST 27 27 22  ALT 37 36 29  ALKPHOS 57 56 53  BILITOT 0.4 0.4 0.4  PROT 7.5 7.6 6.3*  ALBUMIN 3.0* 3.0* 2.4*   Recent Labs  Lab 09/20/18 1323  LIPASE 28    Cardiac Enzymes: Recent Labs  Lab 09/20/18 2043 09/21/18 0251 09/21/18 0853  TROPONINI <0.03 <0.03 <0.03     Recent Results (from the past 240 hour(s))  Respiratory Panel by PCR     Status: None   Collection Time: 09/21/18  5:07 AM  Result Value Ref Range Status   Adenovirus NOT DETECTED NOT DETECTED Final   Coronavirus 229E NOT DETECTED NOT DETECTED Final   Coronavirus HKU1 NOT DETECTED NOT DETECTED Final   Coronavirus NL63 NOT DETECTED NOT DETECTED Final   Coronavirus OC43 NOT DETECTED NOT DETECTED Final    Metapneumovirus NOT DETECTED NOT DETECTED Final   Rhinovirus / Enterovirus NOT DETECTED NOT DETECTED Final   Influenza A NOT DETECTED NOT DETECTED Final   Influenza B NOT DETECTED NOT DETECTED Final   Parainfluenza Virus 1 NOT DETECTED NOT DETECTED Final   Parainfluenza Virus 2 NOT DETECTED NOT DETECTED Final   Parainfluenza Virus 3 NOT DETECTED NOT DETECTED Final   Parainfluenza Virus 4 NOT DETECTED NOT DETECTED Final   Respiratory Syncytial Virus NOT DETECTED NOT DETECTED Final   Bordetella pertussis NOT DETECTED NOT DETECTED Final   Chlamydophila pneumoniae NOT DETECTED NOT DETECTED Final   Mycoplasma pneumoniae NOT DETECTED NOT DETECTED Final    Comment: Performed at [facility], [place] [address] [place], [place] [address]     Scheduled Meds: . patiromer  8.4 g Oral Daily  . prenatal multivitamin  1 tablet Oral Q1200   Continuous Infusions: .  sodium bicarbonate (isotonic) infusion in sterile water 100 mL/hr at 09/21/18 0506     LOS: 1 day   Cherene Altes, MD [facility] Office  [phone] Pager - Text Page per Amion  If 7PM-7AM, please contact night-coverage per Amion 09/21/2018, 11:43 AM

## 2018-09-21 NOTE — ED Notes (Signed)
Per Dr. Roel Cluck, continue bicarb as a continuous infusion.

## 2018-09-21 NOTE — ED Notes (Signed)
Pt requesting something to drink and something for headache

## 2018-09-21 NOTE — Progress Notes (Signed)
S:complaining of headache and neck pain O:BP 134/88   Pulse 73   Temp 98.6 F (37 C) (Oral)   Resp 20   Wt 65.9 kg   LMP 06/22/2018   SpO2 100%   BMI 22.75 kg/m   Intake/Output Summary (Last 24 hours) at 09/21/2018 1142 Last data filed at 09/21/2018 0900 Gross per 24 hour  Intake 400 ml  Output -  Net 400 ml   Intake/Output: No intake/output data recorded.  Intake/Output this shift:  Total I/O In: 400 [P.O.:400] Out: -  Weight change:  Gen: lying in bed in NAD CVS: RRR, no rub Resp: cta [id number], +BS, soft, diffuse tenderness to palpation especially in the lower quadrants Ext: no edema  Recent Labs  Lab 09/20/18 1323 09/20/18 1441 09/20/18 2043 09/21/18 0251 09/21/18 0853  NA 131* 132* 132* 134*  --   K 7.0* 6.1* 5.5* 5.1  --   CL 110 110 109 108  --   CO2 17* 17* 18* 20*  --   GLUCOSE 81 108* 90 99  --   BUN 24* 24* 19 17  --   CREATININE 2.44* 2.51* 2.43* 2.38*  --   ALBUMIN 3.0* 3.0*  --  2.4*  --   CALCIUM 8.1* 8.0* 7.8* 8.2*  --   PHOS  --   --   --   --  4.8*  AST 27 27  --  22  --   ALT 37 36  --  29  --    Liver Function Tests: Recent Labs  Lab 09/20/18 1323 09/20/18 1441 09/21/18 0251  AST 27 27 22   ALT 37 36 29  ALKPHOS 57 56 53  BILITOT 0.4 0.4 0.4  PROT 7.5 7.6 6.3*  ALBUMIN 3.0* 3.0* 2.4*   Recent Labs  Lab 09/20/18 1323  LIPASE 28   No results for input(s): AMMONIA in the last 168 hours. CBC: Recent Labs  Lab 09/20/18 1323 09/21/18 0251  WBC 6.1 4.8  NEUTROABS 4.9  --   HGB 8.0* 7.0*  HCT 22.3* 21.7*  MCV 83.8 87.1  PLT 221 217   Cardiac Enzymes: Recent Labs  Lab 09/20/18 2043 09/21/18 0251 09/21/18 0853  TROPONINI <0.03 <0.03 <0.03   CBG: No results for input(s): GLUCAP in the last 168 hours.  Iron Studies: No results for input(s): IRON, TIBC, TRANSFERRIN, FERRITIN in the last 72 hours. Studies/Results: Dg Chest 2 View  Result Date: 09/20/2018 CLINICAL DATA:  Central chest pain EXAM: CHEST - 2 VIEW  COMPARISON:  07/28/2018 FINDINGS: Mild cardiomegaly. No confluent opacities, effusions or edema. No acute bony abnormality. IMPRESSION: Mild cardiomegaly.  No active disease. Electronically Signed   By: Rolm Baptise M.D.   On: 09/20/2018 21:32   [place] Mfm Ob Limited  Result Date: 09/20/2018 ----------------------------------------------------------------------  OBSTETRICS REPORT                       (Signed Final 09/20/2018 03:28 pm) ---------------------------------------------------------------------- Patient Info  ID #:       [id number]                          D.O.B.:  August 29, 1981 (36 yrs)  Name:       Cathy [name]                Visit Date: 09/20/2018 02:18 pm ---------------------------------------------------------------------- Performed By  Performed By:     Novella Rob  Referred By:      Irene Limbo  Attending:        Tama High MD        Location:         [facility] ---------------------------------------------------------------------- Orders   #  Description                          Code         Ordered By   1  [place] MFM OB LIMITED                    65993.57     Mallie Snooks  ----------------------------------------------------------------------   #  Order #                    Accession #                 Episode #   1  [id number]                  [id number]                  [id number]  ---------------------------------------------------------------------- Indications   [redacted] weeks gestation of pregnancy                Z3A.15   Abdominal pain in pregnancy (diarrhea x 2      O99.89   weeks)  ---------------------------------------------------------------------- Vital Signs  Weight (lb): 145                               Height:        5'7"  BMI:         22.71 ---------------------------------------------------------------------- Fetal Evaluation  Num Of Fetuses:          1  Fetal Heart Rate(bpm):  148  Cardiac Activity:       Observed  Presentation:           Cephalic  Placenta:               Posterior  Amniotic Fluid  AFI FV:      Within normal limits                              Largest Pocket(cm)                              6.32 ---------------------------------------------------------------------- OB History  Gravidity:    6         Term:   4         SAB:   1  Living:       4 ---------------------------------------------------------------------- Gestational  Age  LMP:           12w 6d        Date:  06/22/18                 EDD:   03/29/19  Best:          [name] 6d     Det. By:  Previous Ultrasound      EDD:   03/08/19 ---------------------------------------------------------------------- Cervix Uterus Adnexa  Cervix  Length:           3.56  cm.  Normal appearance by transabdominal scan. ---------------------------------------------------------------------- Impression  Patient was evaluated for abdominal pain and diarrhea.  A limited ultrasound study was performed. Amniotic fluid is  normal and good fetal activity is seen. On transabdominal  scan, the cervix looks long and closed. ----------------------------------------------------------------------                  Tama High, MD Electronically Signed Final Report   09/20/2018 03:28 pm ----------------------------------------------------------------------  . patiromer  8.4 g Oral Daily  . prenatal multivitamin  1 tablet Oral Q1200    BMET    Component Value Date/Time   NA 134 (L) 09/21/2018 0251   K 5.1 09/21/2018 0251   CL 108 09/21/2018 0251   CO2 20 (L) 09/21/2018 0251   GLUCOSE 99 09/21/2018 0251   BUN 17 09/21/2018 0251   CREATININE 2.38 (H) 09/21/2018 0251   CALCIUM 8.2 (L) 09/21/2018 0251   GFRNONAA 25 (L) 09/21/2018 0251   GFRAA 30 (L) 09/21/2018 0251   CBC    Component Value Date/Time   WBC 4.8 09/21/2018 0251   RBC 2.49 (L) 09/21/2018 0251   HGB 7.0 (L) 09/21/2018 0251   HCT 21.7 (L)  09/21/2018 0251   PLT 217 09/21/2018 0251   MCV 87.1 09/21/2018 0251   MCH 28.1 09/21/2018 0251   MCHC 32.3 09/21/2018 0251   RDW 11.8 09/21/2018 0251   LYMPHSABS 1.0 09/20/2018 1323   MONOABS 0.2 09/20/2018 1323   EOSABS 0.1 09/20/2018 1323   BASOSABS 0.0 09/20/2018 1323     Assessment/Plan: 1.  AKI- in setting of [redacted] weeks gestation and history of Lupus as well as N/anorexia and diarrhea.  Hopefully this is all pre-renal, however given the blood and protein the most concerning diagnosis would be a lupus flare.   1. Low C3 and C4 2. 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 3. renal US pending 4. FeNa >1%.   5. Will continue with IVF's and follow renal function closely.   6. She may require a renal biopsy if her renal function continues to worsen.  Unfortunately we can not use immunosuppressive agents with her pregnancy and may lead to some hard decisions. 2. Hyperkalemia- due to AKI.  Improving with isotonic bicarb and veltassa 3. SLE- diagnosed in August 2019 not on any medications 4. [redacted] weeks gestation- per OB 5. Diarrhea- stool cultures and viral studies pending.  No recent abx. 6. Anemia- significant drop over the past 2 months.  Guaiac stools and follow H/H  7. Disposition- Currently residing in an homeless shelter.  Donetta Potts, MD [employer] [phone]

## 2018-09-22 ENCOUNTER — Inpatient Hospital Stay (HOSPITAL_COMMUNITY): Payer: Medicaid Other

## 2018-09-22 ENCOUNTER — Inpatient Hospital Stay (HOSPITAL_COMMUNITY): Payer: Self-pay

## 2018-09-22 ENCOUNTER — Other Ambulatory Visit: Payer: Self-pay

## 2018-09-22 ENCOUNTER — Other Ambulatory Visit (HOSPITAL_COMMUNITY): Payer: Self-pay

## 2018-09-22 LAB — PREPARE RBC (CROSSMATCH)

## 2018-09-22 LAB — CBC
HEMATOCRIT: 20.3 % — AB (ref 36.0–46.0)
Hemoglobin: 6.9 g/dL — CL (ref 12.0–15.0)
MCH: 28.8 pg (ref 26.0–34.0)
MCHC: 34 g/dL (ref 30.0–36.0)
MCV: 84.6 fL (ref 80.0–100.0)
NRBC: 0 % (ref 0.0–0.2)
Platelets: 211 10*3/uL (ref 150–400)
RBC: 2.4 MIL/uL — ABNORMAL LOW (ref 3.87–5.11)
RDW: 11.6 % (ref 11.5–15.5)
WBC: 4.3 10*3/uL (ref 4.0–10.5)

## 2018-09-22 LAB — RENAL FUNCTION PANEL
Albumin: 2.2 g/dL — ABNORMAL LOW (ref 3.5–5.0)
Anion gap: 6 (ref 5–15)
BUN: 13 mg/dL (ref 6–20)
CHLORIDE: 105 mmol/L (ref 98–111)
CO2: 23 mmol/L (ref 22–32)
CREATININE: 2.27 mg/dL — AB (ref 0.44–1.00)
Calcium: 7.8 mg/dL — ABNORMAL LOW (ref 8.9–10.3)
GFR calc non Af Amer: 27 mL/min — ABNORMAL LOW (ref >60)
GFR, EST AFRICAN AMERICAN: 31 mL/min — AB (ref >60)
GLUCOSE: 85 mg/dL (ref 70–99)
Phosphorus: 5.4 mg/dL — ABNORMAL HIGH (ref 2.5–4.6)
Potassium: 4.6 mmol/L (ref 3.5–5.1)
Sodium: 134 mmol/L — ABNORMAL LOW (ref 135–145)

## 2018-09-22 LAB — URINE CULTURE: Culture: <10000 — AB

## 2018-09-22 LAB — IRON AND TIBC
Iron: 71 ug/dL (ref 28–170)
Saturation Ratios: 28 % (ref 10.4–31.8)
TIBC: 249 ug/dL — ABNORMAL LOW (ref 250–450)
UIBC: 178 ug/dL

## 2018-09-22 LAB — PROTIME-INR
INR: 0.94
PROTHROMBIN TIME: 12.5 s (ref 11.4–15.2)

## 2018-09-22 LAB — FERRITIN: FERRITIN: 212 ng/mL (ref 11–307)

## 2018-09-22 LAB — HEPATIC FUNCTION PANEL
ALK PHOS: 51 U/L (ref 38–126)
ALT: 24 U/L (ref 0–44)
AST: 18 U/L (ref 15–41)
Albumin: 2.3 g/dL — ABNORMAL LOW (ref 3.5–5.0)
BILIRUBIN TOTAL: 0.4 mg/dL (ref 0.3–1.2)
Bilirubin, Direct: <0.1 mg/dL (ref 0.0–0.2)
Total Protein: 6.2 g/dL — ABNORMAL LOW (ref 6.5–8.1)

## 2018-09-22 LAB — RETICULOCYTES
Immature Retic Fract: 5 % (ref 2.3–15.9)
RBC.: 2.4 MIL/uL — ABNORMAL LOW (ref 3.87–5.11)
RETIC COUNT ABSOLUTE: 17.8 10*3/uL — AB (ref 19.0–186.0)
Retic Ct Pct: 0.7 % (ref 0.4–3.1)

## 2018-09-22 LAB — LACTATE DEHYDROGENASE: LDH: 135 U/L (ref 98–192)

## 2018-09-22 LAB — FOLATE: Folate: 15.8 ng/mL (ref >5.9)

## 2018-09-22 LAB — VITAMIN B12: Vitamin B-12: 154 pg/mL — ABNORMAL LOW (ref 180–914)

## 2018-09-22 MED ORDER — SODIUM CHLORIDE 0.9% IV SOLUTION: Freq: Once | INTRAVENOUS | Status: DC

## 2018-09-22 MED ORDER — FUROSEMIDE 10 MG/ML IJ SOLN
20.0000 mg | Freq: Once | INTRAMUSCULAR | Status: AC
  Administered 2018-09-22: 20 mg via INTRAVENOUS
  Filled 2018-09-22: qty 2

## 2018-09-22 MED ORDER — CYANOCOBALAMIN 1000 MCG/ML IJ SOLN
1000.0000 ug | Freq: Every day | INTRAMUSCULAR | Status: AC
  Administered 2018-09-22 – 2018-09-24 (×3): 1000 ug via SUBCUTANEOUS
  Filled 2018-09-22 (×3): qty 1

## 2018-09-22 MED ORDER — DIPHENHYDRAMINE HCL 50 MG/ML IJ SOLN
25.0000 mg | Freq: Once | INTRAMUSCULAR | Status: AC
  Administered 2018-09-22: 25 mg via INTRAVENOUS
  Filled 2018-09-22: qty 1

## 2018-09-22 MED ORDER — SODIUM CHLORIDE 0.9% IV SOLUTION
Freq: Once | INTRAVENOUS | Status: AC
  Administered 2018-09-22: 21:00:00 via INTRAVENOUS

## 2018-09-22 NOTE — Progress Notes (Signed)
S: No new complaints O:BP 122/66 (BP Location: Right Arm)   Pulse 73   Temp 98.4 F (36.9 C)   Resp 20   Wt 65.9 kg   LMP 06/22/2018   SpO2 100%   BMI 22.75 kg/m   Intake/Output Summary (Last 24 hours) at 09/22/2018 1027 Last data filed at 09/22/2018 0800 Gross per 24 hour  Intake 3173.07 ml  Output -  Net 3173.07 ml   Intake/Output: I/O last 3 completed shifts: In: 3547.5 [P.O.:640; I.V.:2907.5] Out: -   Intake/Output this shift:  Total I/O In: 25.5 [I.V.:25.5] Out: -  Weight change:  Gen: NAD CVS: no rub Resp: cta Abd: +16 gestational uterus, diffuse tenderness Ext: no edema  Recent Labs  Lab 09/20/18 1323 09/20/18 1441 09/20/18 2043 09/21/18 0251 09/21/18 0853 09/22/18 0357  NA 131* 132* 132* 134*  --  134*  K 7.0* 6.1* 5.5* 5.1  --  4.6  CL 110 110 109 108  --  105  CO2 17* 17* 18* 20*  --  23  GLUCOSE 81 108* 90 99  --  85  BUN 24* 24* 19 17  --  13  CREATININE 2.44* 2.51* 2.43* 2.38*  --  2.27*  ALBUMIN 3.0* 3.0*  --  2.4*  --  2.3*  2.2*  CALCIUM 8.1* 8.0* 7.8* 8.2*  --  7.8*  PHOS  --   --   --   --  4.8* 5.4*  AST 27 27  --  22  --  18  ALT 37 36  --  29  --  24   Liver Function Tests: Recent Labs  Lab 09/20/18 1441 09/21/18 0251 09/22/18 0357  AST 27 22 18   ALT 36 29 24  ALKPHOS 56 53 51  BILITOT 0.4 0.4 0.4  PROT 7.6 6.3* 6.2*  ALBUMIN 3.0* 2.4* 2.3*  2.2*   Recent Labs  Lab 09/20/18 1323  LIPASE 28   No results for input(s): AMMONIA in the last 168 hours. CBC: Recent Labs  Lab 09/20/18 1323 09/21/18 0251 09/22/18 0357  WBC 6.1 4.8 4.3  NEUTROABS 4.9  --   --   HGB 8.0* 7.0* 6.9*  HCT 22.3* 21.7* 20.3*  MCV 83.8 87.1 84.6  PLT 221 217 211   Cardiac Enzymes: Recent Labs  Lab 09/20/18 2043 09/21/18 0251 09/21/18 0853  TROPONINI <0.03 <0.03 <0.03   CBG: No results for input(s): GLUCAP in the last 168 hours.  Iron Studies:  Recent Labs    09/22/18 0357  IRON 71  TIBC 249*  FERRITIN 212    Studies/Results: Dg Chest 2 View  Result Date: 09/20/2018 CLINICAL DATA:  Central chest pain EXAM: CHEST - 2 VIEW COMPARISON:  07/28/2018 FINDINGS: Mild cardiomegaly. No confluent opacities, effusions or edema. No acute bony abnormality. IMPRESSION: Mild cardiomegaly.  No active disease. Electronically Signed   By: Rolm Baptise M.D.   On: 09/20/2018 21:32   [place] Mfm Ob Limited  Result Date: 09/20/2018 ----------------------------------------------------------------------  OBSTETRICS REPORT                       (Signed Final 09/20/2018 03:28 pm) ---------------------------------------------------------------------- Patient Info  ID #:       [id number]                          D.O.B.:  September 20, 1981 (36 yrs)  Name:       Cathy [name]  Visit Date: 09/20/2018 02:18 pm ---------------------------------------------------------------------- Performed By  Performed By:     Novella Rob        Referred By:      Irene Limbo  Attending:        Tama High MD        Location:         [facility] ---------------------------------------------------------------------- Orders   #  Description                          Code         Ordered By   1  [place] MFM OB LIMITED                    09323.55     Mallie Snooks  ----------------------------------------------------------------------   #  Order #                    Accession #                 Episode #   1  [id number]                  [id number]                  [id number]  ---------------------------------------------------------------------- Indications   [redacted] weeks gestation of pregnancy                Z3A.15   Abdominal pain in pregnancy (diarrhea x 2      O99.89   weeks)  ---------------------------------------------------------------------- Vital Signs  Weight (lb): 145                               Height:        5'7"  BMI:          22.71 ---------------------------------------------------------------------- Fetal Evaluation  Num Of Fetuses:         1  Fetal Heart Rate(bpm):  148  Cardiac Activity:       Observed  Presentation:           Cephalic  Placenta:               Posterior  Amniotic Fluid  AFI FV:      Within normal limits                              Largest Pocket(cm)                              6.32 ---------------------------------------------------------------------- OB History  Gravidity:    6         Term:  4         SAB:   1  Living:       4 ---------------------------------------------------------------------- Gestational Age  LMP:           12w 6d        Date:  06/22/18                 EDD:   03/29/19  Best:          Cathy [name] 6d     Det. By:  Previous Ultrasound      EDD:   03/08/19 ---------------------------------------------------------------------- Cervix Uterus Adnexa  Cervix  Length:           3.56  cm.  Normal appearance by transabdominal scan. ---------------------------------------------------------------------- Impression  Patient was evaluated for abdominal pain and diarrhea.  A limited ultrasound study was performed. Amniotic fluid is  normal and good fetal activity is seen. On transabdominal  scan, the cervix looks long and closed. ----------------------------------------------------------------------                  Tama High, MD Electronically Signed Final Report   09/20/2018 03:28 pm ----------------------------------------------------------------------  . sodium chloride   Intravenous Once  . patiromer  8.4 g Oral Daily  . prenatal multivitamin  1 tablet Oral Q1200    BMET    Component Value Date/Time   NA 134 (L) 09/22/2018 0357   K 4.6 09/22/2018 0357   CL 105 09/22/2018 0357   CO2 23 09/22/2018 0357   GLUCOSE 85 09/22/2018 0357   BUN 13 09/22/2018 0357   CREATININE 2.27 (H) 09/22/2018 0357   CALCIUM 7.8 (L) 09/22/2018 0357   GFRNONAA 27 (L) 09/22/2018 0357   GFRAA 31 (L) 09/22/2018  0357   CBC    Component Value Date/Time   WBC 4.3 09/22/2018 0357   RBC 2.40 (L) 09/22/2018 0357   RBC 2.40 (L) 09/22/2018 0357   HGB 6.9 (LL) 09/22/2018 0357   HCT 20.3 (L) 09/22/2018 0357   PLT 211 09/22/2018 0357   MCV 84.6 09/22/2018 0357   MCH 28.8 09/22/2018 0357   MCHC 34.0 09/22/2018 0357   RDW 11.6 09/22/2018 0357   LYMPHSABS 1.0 09/20/2018 1323   MONOABS 0.2 09/20/2018 1323   EOSABS 0.1 09/20/2018 1323   BASOSABS 0.0 09/20/2018 1323     Assessment/Plan: 1. AKI- in setting of [redacted] weeks gestation and history of Lupus as well as N/anorexia and diarrhea. Hopefully this is all pre-renal, however given the blood and protein the most concerning diagnosis would be a lupus flare.  1. Low C3 and C4, ANA + 1:1280, speckled 2. 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 3. renal US pending 4. FeNa >1%.  5. Will continue with IVF's and follow renal function closely.  6. Plan for renal biopsy today 7. Unfortunately we can not use immunosuppressive agents with her pregnancy and may lead to some hard decisions.  Will start empiric solumedrol and follow.  2. Hyperkalemia- due to AKI. Improved with isotonic bicarb and veltassa.  Will change IVF's to NS. 3. SLE- diagnosed in August 2019 not on any medications 4. [redacted] weeks gestation- per OB 5. Diarrhea- stool cultures and viral studies pending. No recent abx. 6. Anemia- significant drop over the past 2 months. Guaiac stools and follow H/H  7. Disposition- Currently residing in an homeless shelter.  Donetta Potts, MD [employer] [phone]

## 2018-09-22 NOTE — Progress Notes (Signed)
Patient approved for random renal biopsy in IR pending improvement of anemia - CBC shows worsening H/H today.   Procedure to be postponed until Hgb >9.0.   IR will continue to follow and re-evaluation after anemia has improved.

## 2018-09-22 NOTE — Progress Notes (Signed)
Cathy [name]  SHF:[id number] DOB: 10/21/1981 DOA: 09/20/2018 PCP: Patient, No Pcp Per    Brief Narrative:  37 y.o. F w/ a hx ofbipolar disorder, recent diagnosis of SLE, homelessness, PID/gonorrhea and trichomonas infections, and depression who presented w/ a week of nausea vomiting diarrhea and abdominal pain. She initially presented to [facility] as she is currently [redacted] weeks pregnant.  Her evaluation revealed K+ of 7.3, creatinine 2.51, UA w/ large blood. An US showed normal fetal movement. She was transferred to [facility].  Subjective: He denies any complaints today, fever, no chills, no nausea, no vomiting, no abdominal pain .  Assessment & Plan:  Acute renal failure  Nephrology following , concern for lupus flare, biopsy to be done by IR, given hemoglobin of 6.9 today, they held on biopsy, so I will transfuse 2 units PRBC. -Treatment per renal service.  Recent Labs  Lab 09/20/18 1323 09/20/18 1441 09/20/18 2043 09/21/18 0251 09/22/18 0357  CREATININE 2.44* 2.51* 2.43* 2.38* 2.27*    Hyperkalemia  Corrected   Hypomagnesemia  Due to GI losses - repeat in am.  Diarrhea  Etiology not clear - studies pending - keep hydrated - follow lytes   Normocytic Anemia  -Repeat hemoglobin this morning is 6.9, I will transfuse 2 units PRBC today so she can have her renal biopsy done . -Vitamin B12 in the low range, will be repleted.  B12 deficiency -Start with IM supplements.  Recently diagnosed SLE Presented w/ malar rash August 2019  Pregnancy - 16 weeks , started on prenatal vitamins, did not receive any prenatal care,  Tobacco abuse -He was counseled, currently on nicotine patch  Bipolar D/O  DVT prophylaxis: SCDs, encourage ambulate Code Status: FULL CODE Family Communication: I have discussed with mother at bedside  disposition Plan: safe for tele bed status   Consultants:  Nephrology   Antimicrobials:  none  Objective: Blood pressure  134/76, pulse 80, temperature 99 F (37.2 C), temperature source Oral, resp. rate 20, weight 65.9 kg, last menstrual period 06/22/2018, SpO2 99 %.  Intake/Output Summary (Last 24 hours) at 09/22/2018 1553 Last data filed at 09/22/2018 1545 Gross per 24 hour  Intake 3759.73 ml  Output -  Net 3759.73 ml   Filed Weights   09/20/18 1224  Weight: 65.9 kg    Examination:  Awake Alert, Oriented X 3, No new F.N deficits, Normal affect Symmetrical Chest wall movement, Good air movement bilaterally, CTAB RRR,No Gallops,Rubs or new Murmurs, No Parasternal Heave +ve B.Sounds, Abd Soft, No tenderness,Gravid, No rebound - guarding or rigidity. No Cyanosis, Clubbing or edema, No new Rash or bruise      CBC: Recent Labs  Lab 09/20/18 1323 09/21/18 0251 09/22/18 0357  WBC 6.1 4.8 4.3  NEUTROABS 4.9  --   --   HGB 8.0* 7.0* 6.9*  HCT 22.3* 21.7* 20.3*  MCV 83.8 87.1 84.6  PLT 221 217 502   Basic Metabolic Panel: Recent Labs  Lab 09/20/18 2043 09/21/18 0251 09/21/18 0853 09/22/18 0357  NA 132* 134*  --  134*  K 5.5* 5.1  --  4.6  CL 109 108  --  105  CO2 18* 20*  --  23  GLUCOSE 90 99  --  85  BUN 19 17  --  13  CREATININE 2.43* 2.38*  --  2.27*  CALCIUM 7.8* 8.2*  --  7.8*  MG  --   --  1.5*  --   PHOS  --   --  4.8* 5.4*   GFR: Estimated Creatinine Clearance: 34 mL/min (A) (by C-G formula based on SCr of 2.27 mg/dL (H)).  Liver Function Tests: Recent Labs  Lab 09/20/18 1323 09/20/18 1441 09/21/18 0251 09/22/18 0357  AST 27 27 22 18   ALT 37 36 29 24  ALKPHOS 57 56 53 51  BILITOT 0.4 0.4 0.4 0.4  PROT 7.5 7.6 6.3* 6.2*  ALBUMIN 3.0* 3.0* 2.4* 2.3*  2.2*   Recent Labs  Lab 09/20/18 1323  LIPASE 28    Cardiac Enzymes: Recent Labs  Lab 09/20/18 2043 09/21/18 0251 09/21/18 0853  TROPONINI <0.03 <0.03 <0.03     Recent Results (from the past 240 hour(s))  Culture, OB Urine     Status: None   Collection Time: 09/20/18 12:38 PM  Result Value Ref Range  Status   Specimen Description   Final    URINE, CLEAN CATCH Performed at [facility], [address], [place], [place] [address]    Special Requests   Final    NONE Performed at [facility], [address], [place], [place] [address]    Culture   Final    NO GROWTH NO GROUP B STREP (S.AGALACTIAE) ISOLATED Performed at [facility], [place] [address] [place], [place] [address]    Report Status 09/21/2018 FINAL  Final  Gastrointestinal Panel by PCR , Stool     Status: None   Collection Time: 09/21/18  2:11 AM  Result Value Ref Range Status   Campylobacter species NOT DETECTED NOT DETECTED Final   Plesimonas shigelloides NOT DETECTED NOT DETECTED Final   Salmonella species NOT DETECTED NOT DETECTED Final   Yersinia enterocolitica NOT DETECTED NOT DETECTED Final   Vibrio species NOT DETECTED NOT DETECTED Final   Vibrio cholerae NOT DETECTED NOT DETECTED Final   Enteroaggregative E coli (EAEC) NOT DETECTED NOT DETECTED Final   Enteropathogenic E coli (EPEC) NOT DETECTED NOT DETECTED Final   Enterotoxigenic E coli (ETEC) NOT DETECTED NOT DETECTED Final   Shiga like toxin producing E coli (STEC) NOT DETECTED NOT DETECTED Final   Shigella/Enteroinvasive E coli (EIEC) NOT DETECTED NOT DETECTED Final   Cryptosporidium NOT DETECTED NOT DETECTED Final   Cyclospora cayetanensis NOT DETECTED NOT DETECTED Final   Entamoeba histolytica NOT DETECTED NOT DETECTED Final   Giardia lamblia NOT DETECTED NOT DETECTED Final   Adenovirus F40/41 NOT DETECTED NOT DETECTED Final   Astrovirus NOT DETECTED NOT DETECTED Final   Norovirus GI/GII NOT DETECTED NOT DETECTED Final   Rotavirus A NOT DETECTED NOT DETECTED Final   Sapovirus (I, II, IV, and V) NOT DETECTED NOT DETECTED Final    Comment: Performed at [facility], [place] [address] [place], [place] [address]  Respiratory Panel by PCR     Status: None   Collection Time: 09/21/18  5:07 AM  Result Value Ref Range Status    Adenovirus NOT DETECTED NOT DETECTED Final   Coronavirus 229E NOT DETECTED NOT DETECTED Final   Coronavirus HKU1 NOT DETECTED NOT DETECTED Final   Coronavirus NL63 NOT DETECTED NOT DETECTED Final   Coronavirus OC43 NOT DETECTED NOT DETECTED Final   Metapneumovirus NOT DETECTED NOT DETECTED Final   Rhinovirus / Enterovirus NOT DETECTED NOT DETECTED Final   Influenza A NOT DETECTED NOT DETECTED Final   Influenza B NOT DETECTED NOT DETECTED Final   Parainfluenza Virus 1 NOT DETECTED NOT DETECTED Final   Parainfluenza Virus 2 NOT DETECTED NOT DETECTED Final   Parainfluenza Virus 3 NOT DETECTED NOT  DETECTED Final   Parainfluenza Virus 4 NOT DETECTED NOT DETECTED Final   Respiratory Syncytial Virus NOT DETECTED NOT DETECTED Final   Bordetella pertussis NOT DETECTED NOT DETECTED Final   Chlamydophila pneumoniae NOT DETECTED NOT DETECTED Final   Mycoplasma pneumoniae NOT DETECTED NOT DETECTED Final    Comment: Performed at [facility], [place] [address], [place], [place] [address]  MRSA PCR Screening     Status: None   Collection Time: 09/21/18  9:44 AM  Result Value Ref Range Status   MRSA by PCR NEGATIVE NEGATIVE Final    Comment:        The GeneXpert MRSA Assay (FDA approved for NASAL specimens only), is one component of a comprehensive MRSA colonization surveillance program. It is not intended to diagnose MRSA infection nor to guide or monitor treatment for MRSA infections. Performed at [facility], [place] [address] [place], [place] [address]   Urine Culture     Status: Abnormal   Collection Time: 09/21/18  6:20 PM  Result Value Ref Range Status   Specimen Description URINE, RANDOM  Final   Special Requests NONE  Final   Culture (A)  Final    <10,000 COLONIES/mL INSIGNIFICANT GROWTH Performed at [facility], [place] [address] [place], [place] [address]    Report Status 09/22/2018 FINAL  Final     Scheduled Meds: . sodium chloride   Intravenous Once  .  patiromer  8.4 g Oral Daily  . prenatal multivitamin  1 tablet Oral Q1200   Continuous Infusions: .  sodium bicarbonate (isotonic) infusion in sterile water 100 mL/hr at 09/22/18 0834     LOS: 2 days   Phillips Climes, MD [facility] Office  [phone] Pager - Text Page per Shea Evans  If 7PM-7AM, please contact night-coverage per Amion 09/22/2018, 3:53 PM

## 2018-09-23 ENCOUNTER — Inpatient Hospital Stay (HOSPITAL_COMMUNITY): Payer: Medicaid Other

## 2018-09-23 DIAGNOSIS — I517 Cardiomegaly: Secondary | ICD-10-CM

## 2018-09-23 LAB — RENAL FUNCTION PANEL
ALBUMIN: 2.3 g/dL — AB (ref 3.5–5.0)
ANION GAP: 3 — AB (ref 5–15)
BUN: 14 mg/dL (ref 6–20)
CALCIUM: 7.7 mg/dL — AB (ref 8.9–10.3)
CO2: 22 mmol/L (ref 22–32)
Chloride: 106 mmol/L (ref 98–111)
Creatinine, Ser: 2.37 mg/dL — ABNORMAL HIGH (ref 0.44–1.00)
GFR, EST AFRICAN AMERICAN: 30 mL/min — AB (ref >60)
GFR, EST NON AFRICAN AMERICAN: 26 mL/min — AB (ref >60)
Glucose, Bld: 85 mg/dL (ref 70–99)
PHOSPHORUS: 4.7 mg/dL — AB (ref 2.5–4.6)
Potassium: 4.7 mmol/L (ref 3.5–5.1)
SODIUM: 131 mmol/L — AB (ref 135–145)

## 2018-09-23 LAB — CBC
HCT: 26.5 % — ABNORMAL LOW (ref 36.0–46.0)
Hemoglobin: 8.9 g/dL — ABNORMAL LOW (ref 12.0–15.0)
MCH: 29 pg (ref 26.0–34.0)
MCHC: 33.6 g/dL (ref 30.0–36.0)
MCV: 86.3 fL (ref 80.0–100.0)
PLATELETS: 188 10*3/uL (ref 150–400)
RBC: 3.07 MIL/uL — AB (ref 3.87–5.11)
RDW: 11.9 % (ref 11.5–15.5)
WBC: 5 10*3/uL (ref 4.0–10.5)
nRBC: 0 % (ref 0.0–0.2)

## 2018-09-23 LAB — TYPE AND SCREEN
ABO/RH(D): O POS
Antibody Screen: POSITIVE
DAT, IgG: POSITIVE
Donor AG Type: NEGATIVE
Donor AG Type: NEGATIVE
Unit division: 0
Unit division: 0

## 2018-09-23 LAB — BPAM RBC
Blood Product Expiration Date: 201911052359
Blood Product Expiration Date: 201911102359
ISSUE DATE / TIME: 201910231536
ISSUE DATE / TIME: 201910232035
Unit Type and Rh: 5100
Unit Type and Rh: 9500

## 2018-09-23 LAB — WET PREP, GENITAL
SPERM: NONE SEEN
TRICH WET PREP: NONE SEEN
YEAST WET PREP: NONE SEEN

## 2018-09-23 LAB — ECHOCARDIOGRAM COMPLETE: WEIGHTICAEL: 2324 [oz_av]

## 2018-09-23 LAB — HAPTOGLOBIN: Haptoglobin: 18 mg/dL — ABNORMAL LOW (ref 34–200)

## 2018-09-23 MED ORDER — FENTANYL CITRATE (PF) 100 MCG/2ML IJ SOLN
INTRAMUSCULAR | Status: AC | PRN
  Administered 2018-09-23: 50 ug via INTRAVENOUS

## 2018-09-23 MED ORDER — LIDOCAINE-EPINEPHRINE 1 %-1:100000 IJ SOLN
INTRAMUSCULAR | Status: AC
  Filled 2018-09-23: qty 1

## 2018-09-23 MED ORDER — HYDROCORTISONE 1 % EX CREA
TOPICAL_CREAM | Freq: Three times a day (TID) | CUTANEOUS | Status: DC
  Administered 2018-09-23: 1 via TOPICAL
  Administered 2018-09-23 – 2018-09-28 (×15): via TOPICAL
  Filled 2018-09-23: qty 28

## 2018-09-23 MED ORDER — FENTANYL CITRATE (PF) 100 MCG/2ML IJ SOLN
INTRAMUSCULAR | Status: AC
  Filled 2018-09-23: qty 2

## 2018-09-23 NOTE — Progress Notes (Signed)
Patient was complaining today of having some vaginal discharge; MD recommended wet prep and GCC test. Because the staff on our floor is not trained to collect samples for these tests, Probation officer and charge nurse talk with the unit manager and we called [facility] to see if we can collect the samples and the procedure. [place] said that the patient can collect by herself the sample and than we can send it to the lab. MD made aware and he agreed with the patient collecting the sample. Patient was willing to collect the sample and she tolerated well. The samples were send to the lab. Will continue to monitor.

## 2018-09-23 NOTE — Procedures (Signed)
Pre Procedure Dx: Worsening renal insufficiency Post Procedural Dx: Same  Technically successful US guided biopsy of inferior pole of the left kidney  EBL: None  No immediate complications.   Ronny Bacon, MD Pager #: [phone]

## 2018-09-23 NOTE — Progress Notes (Addendum)
S:biopsy cancelled yesterday due to drop in Hgb to 6.9.  Improved to 8.9 after blood transfusion.  Feels better today O:BP (!) 148/86   Pulse 64   Temp 99 F (37.2 C) (Oral)   Resp 14   Wt 65.9 kg   LMP 06/22/2018   SpO2 99%   BMI 22.75 kg/m   Intake/Output Summary (Last 24 hours) at 09/23/2018 1202 Last data filed at 09/23/2018 1036 Gross per 24 hour  Intake 2579.76 ml  Output -  Net 2579.76 ml   Intake/Output: I/O last 3 completed shifts: In: 5638.6 [P.O.:600; I.V.:4248.6; Blood:790] Out: -   Intake/Output this shift:  Total I/O In: 120 [P.O.:120] Out: -  Weight change:  Gen: NAD CVS: no rub Resp: cta Abd: +distended, enlarged uterus compared to [redacted] weeks gestation, some diffuse lower abd tenderness Ext: no edema  Recent Labs  Lab 09/20/18 1323 09/20/18 1441 09/20/18 2043 09/21/18 0251 09/21/18 0853 09/22/18 0357 09/23/18 0347  NA 131* 132* 132* 134*  --  134* 131*  K 7.0* 6.1* 5.5* 5.1  --  4.6 4.7  CL 110 110 109 108  --  105 106  CO2 17* 17* 18* 20*  --  23 22  GLUCOSE 81 108* 90 99  --  85 85  BUN 24* 24* 19 17  --  13 14  CREATININE 2.44* 2.51* 2.43* 2.38*  --  2.27* 2.37*  ALBUMIN 3.0* 3.0*  --  2.4*  --  2.3*  2.2* 2.3*  CALCIUM 8.1* 8.0* 7.8* 8.2*  --  7.8* 7.7*  PHOS  --   --   --   --  4.8* 5.4* 4.7*  AST 27 27  --  22  --  18  --   ALT 37 36  --  29  --  24  --    Liver Function Tests: Recent Labs  Lab 09/20/18 1441 09/21/18 0251 09/22/18 0357 09/23/18 0347  AST 27 22 18   --   ALT 36 29 24  --   ALKPHOS 56 53 51  --   BILITOT 0.4 0.4 0.4  --   PROT 7.6 6.3* 6.2*  --   ALBUMIN 3.0* 2.4* 2.3*  2.2* 2.3*   Recent Labs  Lab 09/20/18 1323  LIPASE 28   No results for input(s): AMMONIA in the last 168 hours. CBC: Recent Labs  Lab 09/20/18 1323 09/21/18 0251 09/22/18 0357 09/23/18 0347  WBC 6.1 4.8 4.3 5.0  NEUTROABS 4.9  --   --   --   HGB 8.0* 7.0* 6.9* 8.9*  HCT 22.3* 21.7* 20.3* 26.5*  MCV 83.8 87.1 84.6 86.3  PLT 221  217 211 188   Cardiac Enzymes: Recent Labs  Lab 09/20/18 2043 09/21/18 0251 09/21/18 0853  TROPONINI <0.03 <0.03 <0.03   CBG: No results for input(s): GLUCAP in the last 168 hours.  Iron Studies:  Recent Labs    09/22/18 0357  IRON 71  TIBC 249*  FERRITIN 212   Studies/Results: US Renal  Result Date: 09/22/2018 CLINICAL DATA:  Acute renal failure. EXAM: RENAL / URINARY TRACT ULTRASOUND COMPLETE COMPARISON:  Ultrasound 09/20/2018.  CT 11/08/2010. FINDINGS: Right Kidney: Length: 13.0 cm. Increased echogenicity. No mass. Mild right hydronephrosis. Trace amount of fluid noted about the right kidney. Left Kidney: Length: 13.3 cm. Increased echogenicity. No mass or hydronephrosis visualized. Bladder: Appears normal for degree of bladder distention. Patient is pregnant. Reference is made to prior ultrasound of 09/20/2018. IMPRESSION: 1. Mild right hydronephrosis. Trace amount of  fluid noted about the right kidney. 2. Increased echogenicity both kidneys consistent chronic medical renal disease. Electronically Signed   By: Marcello Moores  Register   On: 09/22/2018 12:36   . sodium chloride   Intravenous Once  . cyanocobalamin  1,000 mcg Subcutaneous Daily  . fentaNYL      . hydrocortisone cream   Topical TID  . lidocaine-EPINEPHrine      . patiromer  8.4 g Oral Daily  . prenatal multivitamin  1 tablet Oral Q1200    BMET    Component Value Date/Time   NA 131 (L) 09/23/2018 0347   K 4.7 09/23/2018 0347   CL 106 09/23/2018 0347   CO2 22 09/23/2018 0347   GLUCOSE 85 09/23/2018 0347   BUN 14 09/23/2018 0347   CREATININE 2.37 (H) 09/23/2018 0347   CALCIUM 7.7 (L) 09/23/2018 0347   GFRNONAA 26 (L) 09/23/2018 0347   GFRAA 30 (L) 09/23/2018 0347   CBC    Component Value Date/Time   WBC 5.0 09/23/2018 0347   RBC 3.07 (L) 09/23/2018 0347   HGB 8.9 (L) 09/23/2018 0347   HCT 26.5 (L) 09/23/2018 0347   PLT 188 09/23/2018 0347   MCV 86.3 09/23/2018 0347   MCH 29.0 09/23/2018 0347   MCHC  33.6 09/23/2018 0347   RDW 11.9 09/23/2018 0347   LYMPHSABS 1.0 09/20/2018 1323   MONOABS 0.2 09/20/2018 1323   EOSABS 0.1 09/20/2018 1323   BASOSABS 0.0 09/20/2018 1323     Assessment/Plan: 1. AKI- in setting of [redacted] weeks gestation and history of Lupus as well as N/anorexia and diarrhea. 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 1. Low C3 and C4, ANA + 1:1280, speckled 2. 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 3. renal USpending 4. FeNa>1%.  5. Willcontinue withIVF's and follow renal function closely.  6. Plan for renal biopsy either today or tomorrow as discussed with IR  7. Unfortunately we can not use immunosuppressive agents with her pregnancy and may lead to some hard decisions.  Will start empiric solumedrol after biopsy and follow.  2. Hyperkalemia- due to AKI.Improved withisotonic bicarb and veltassa.  Will change IVF's to NS. 3. SLE- diagnosed in August 2019 not on any medications 4. [redacted] weeks gestation- per OB 5. Diarrhea- stool cultures and viral studiespending. No recent abx. 6. Anemia- significant drop over the past 2 months. Guaiac stools and follow H/H  7. Disposition- Currently residing in an homeless shelter.  Donetta Potts, MD [employer] [phone]

## 2018-09-23 NOTE — Progress Notes (Signed)
Cathy [name]  YPP:[id number] DOB: 1981/05/06 DOA: 09/20/2018 PCP: Patient, No Pcp Per    Brief Narrative:  37 y.o. F w/ a hx ofbipolar disorder, recent diagnosis of SLE, homelessness, PID/gonorrhea and trichomonas infections, and depression who presented w/ a week of nausea vomiting diarrhea and abdominal pain. She initially presented to [facility] as she is currently [redacted] weeks pregnant.  Her evaluation revealed K+ of 7.3, creatinine 2.51, UA w/ large blood. An US showed normal fetal movement. She was transferred to [facility]  Subjective: She reports some rash in the inner thigh, and some vaginal discharge, white, thick and foul-smelling order, no nausea, no vomiting, fever or chills .Marland Kitchen  Assessment & Plan:  Acute renal failure  Treatment per renal, there is concern for acute lupus flare, especially in the setting of blood and the protein in the urine, with low C3 and C4, positive ANA speckled, . -Renal biopsy was done 09/23/2018 by IR, will follow on pathology .  Recent Labs  Lab 09/20/18 1441 09/20/18 2043 09/21/18 0251 09/22/18 0357 09/23/18 0347  CREATININE 2.51* 2.43* 2.38* 2.27* 2.37*    Hyperkalemia  Corrected   Hypomagnesemia  Due to GI losses - repeat in am.  Diarrhea  Etiology not clear - studies pending - keep hydrated - follow lytes   Normocytic Anemia  -hemoglobin dropped to 6.9 after hydration, transfused total of 2 units, with good response, hemoglobin is 8.9 today  -Vitamin B12 in the low range, will be repleted.  B12 deficiency -Started on IM supplements, folate within normal limits  Recently diagnosed SLE Presented w/ malar rash August 2019  Pregnancy - 16 weeks , started on prenatal vitamins, did not receive any prenatal care, -He does report some vaginal discharge today, discussed with OB, will obtain with prep, chlamydia and gonorrhea.  Tobacco abuse --She was counseled, currently on nicotine patch  Bipolar D/O  DVT  prophylaxis: SCDs, she was encouraged to ambulate Code Status: FULL CODE Family Communication: I have discussed with mother at bedside  disposition Plan: home when stable   Consultants:  Nephrology  IR  Antimicrobials:  none  Objective: Blood pressure 136/83, pulse 67, temperature 98.5 F (36.9 C), temperature source Oral, resp. rate 18, weight 65.9 kg, last menstrual period 06/22/2018, SpO2 100 %.  Intake/Output Summary (Last 24 hours) at 09/23/2018 1604 Last data filed at 09/23/2018 1300 Gross per 24 hour  Intake 2058.89 ml  Output 350 ml  Net 1708.89 ml   Filed Weights   09/20/18 1224  Weight: 65.9 kg    Examination:  Awake Alert, Oriented X 3, No new F.N deficits, Normal affect Symmetrical Chest wall movement, Good air movement bilaterally, CTAB RRR,No Gallops,Rubs or new Murmurs, No Parasternal Heave +ve B.Sounds, Abd Soft, gravid+,  no tenderness, No rebound - guarding or rigidity. No Cyanosis, Clubbing or edema, No new Rash or bruise       CBC: Recent Labs  Lab 09/20/18 1323 09/21/18 0251 09/22/18 0357 09/23/18 0347  WBC 6.1 4.8 4.3 5.0  NEUTROABS 4.9  --   --   --   HGB 8.0* 7.0* 6.9* 8.9*  HCT 22.3* 21.7* 20.3* 26.5*  MCV 83.8 87.1 84.6 86.3  PLT 221 217 211 458   Basic Metabolic Panel: Recent Labs  Lab 09/21/18 0251 09/21/18 0853 09/22/18 0357 09/23/18 0347  NA 134*  --  134* 131*  K 5.1  --  4.6 4.7  CL 108  --  105 106  CO2 20*  --  23 22  GLUCOSE 99  --  85 85  BUN 17  --  13 14  CREATININE 2.38*  --  2.27* 2.37*  CALCIUM 8.2*  --  7.8* 7.7*  MG  --  1.5*  --   --   PHOS  --  4.8* 5.4* 4.7*   GFR: Estimated Creatinine Clearance: 32.5 mL/min (A) (by C-G formula based on SCr of 2.37 mg/dL (H)).  Liver Function Tests: Recent Labs  Lab 09/20/18 1323 09/20/18 1441 09/21/18 0251 09/22/18 0357 09/23/18 0347  AST 27 27 22 18   --   ALT 37 36 29 24  --   ALKPHOS 57 56 53 51  --   BILITOT 0.4 0.4 0.4 0.4  --   PROT 7.5 7.6 6.3*  6.2*  --   ALBUMIN 3.0* 3.0* 2.4* 2.3*  2.2* 2.3*   Recent Labs  Lab 09/20/18 1323  LIPASE 28    Cardiac Enzymes: Recent Labs  Lab 09/20/18 2043 09/21/18 0251 09/21/18 0853  TROPONINI <0.03 <0.03 <0.03     Recent Results (from the past 240 hour(s))  Culture, OB Urine     Status: None   Collection Time: 09/20/18 12:38 PM  Result Value Ref Range Status   Specimen Description   Final    URINE, CLEAN CATCH Performed at [facility], [address], [place], [place] [address]    Special Requests   Final    NONE Performed at [facility], [address], [place], [place] [address]    Culture   Final    NO GROWTH NO GROUP B STREP (S.AGALACTIAE) ISOLATED Performed at [facility], [place] [address], [place], [place] [address]    Report Status 09/21/2018 FINAL  Final  Gastrointestinal Panel by PCR , Stool     Status: None   Collection Time: 09/21/18  2:11 AM  Result Value Ref Range Status   Campylobacter species NOT DETECTED NOT DETECTED Final   Plesimonas shigelloides NOT DETECTED NOT DETECTED Final   Salmonella species NOT DETECTED NOT DETECTED Final   Yersinia enterocolitica NOT DETECTED NOT DETECTED Final   Vibrio species NOT DETECTED NOT DETECTED Final   Vibrio cholerae NOT DETECTED NOT DETECTED Final   Enteroaggregative E coli (EAEC) NOT DETECTED NOT DETECTED Final   Enteropathogenic E coli (EPEC) NOT DETECTED NOT DETECTED Final   Enterotoxigenic E coli (ETEC) NOT DETECTED NOT DETECTED Final   Shiga like toxin producing E coli (STEC) NOT DETECTED NOT DETECTED Final   Shigella/Enteroinvasive E coli (EIEC) NOT DETECTED NOT DETECTED Final   Cryptosporidium NOT DETECTED NOT DETECTED Final   Cyclospora cayetanensis NOT DETECTED NOT DETECTED Final   Entamoeba histolytica NOT DETECTED NOT DETECTED Final   Giardia lamblia NOT DETECTED NOT DETECTED Final   Adenovirus F40/41 NOT DETECTED NOT DETECTED Final   Astrovirus NOT DETECTED NOT DETECTED Final   Norovirus  GI/GII NOT DETECTED NOT DETECTED Final   Rotavirus A NOT DETECTED NOT DETECTED Final   Sapovirus (I, II, IV, and V) NOT DETECTED NOT DETECTED Final    Comment: Performed at [facility], [address] [place], [place] [address]  Respiratory Panel by PCR     Status: None   Collection Time: 09/21/18  5:07 AM  Result Value Ref Range Status   Adenovirus NOT DETECTED NOT DETECTED Final   Coronavirus 229E NOT DETECTED NOT DETECTED Final   Coronavirus HKU1 NOT DETECTED NOT DETECTED Final   Coronavirus NL63 NOT DETECTED NOT DETECTED Final   Coronavirus  OC43 NOT DETECTED NOT DETECTED Final   Metapneumovirus NOT DETECTED NOT DETECTED Final   Rhinovirus / Enterovirus NOT DETECTED NOT DETECTED Final   Influenza A NOT DETECTED NOT DETECTED Final   Influenza B NOT DETECTED NOT DETECTED Final   Parainfluenza Virus 1 NOT DETECTED NOT DETECTED Final   Parainfluenza Virus 2 NOT DETECTED NOT DETECTED Final   Parainfluenza Virus 3 NOT DETECTED NOT DETECTED Final   Parainfluenza Virus 4 NOT DETECTED NOT DETECTED Final   Respiratory Syncytial Virus NOT DETECTED NOT DETECTED Final   Bordetella pertussis NOT DETECTED NOT DETECTED Final   Chlamydophila pneumoniae NOT DETECTED NOT DETECTED Final   Mycoplasma pneumoniae NOT DETECTED NOT DETECTED Final    Comment: Performed at [facility], [place] [address] [place], [place] [address]  MRSA PCR Screening     Status: None   Collection Time: 09/21/18  9:44 AM  Result Value Ref Range Status   MRSA by PCR NEGATIVE NEGATIVE Final    Comment:        The GeneXpert MRSA Assay (FDA approved for NASAL specimens only), is one component of a comprehensive MRSA colonization surveillance program. It is not intended to diagnose MRSA infection nor to guide or monitor treatment for MRSA infections. Performed at [facility], [place] [address] [place], [place] [address]   Urine Culture     Status: Abnormal   Collection Time: 09/21/18  6:20 PM    Result Value Ref Range Status   Specimen Description URINE, RANDOM  Final   Special Requests NONE  Final   Culture (A)  Final    <10,000 COLONIES/mL INSIGNIFICANT GROWTH Performed at [facility], [place] [address], [place], [place] [address]    Report Status 09/22/2018 FINAL  Final     Scheduled Meds: . sodium chloride   Intravenous Once  . cyanocobalamin  1,000 mcg Subcutaneous Daily  . fentaNYL      . hydrocortisone cream   Topical TID  . lidocaine-EPINEPHrine      . patiromer  8.4 g Oral Daily  . prenatal multivitamin  1 tablet Oral Q1200   Continuous Infusions: .  sodium bicarbonate (isotonic) infusion in sterile water 100 mL/hr at 09/22/18 2359     LOS: 3 days   Phillips Climes, MD [facility] Office  [phone] Pager - Text Page per Shea Evans  If 7PM-7AM, please contact night-coverage per Amion 09/23/2018, 4:04 PM

## 2018-09-23 NOTE — Progress Notes (Signed)
Spoke with Larene Beach, rapid response nurse at [facility]. She spoke with Dr Geralyn Flash who states that fetal monitoring is not necessary for 16wk2d gestation and it is ok to sedate this pt for renal biopsy procedure.

## 2018-09-23 NOTE — Progress Notes (Signed)
  Echocardiogram 2D Echocardiogram has been performed.  Merrie Roof F 09/23/2018, 2:31 PM

## 2018-09-23 NOTE — Progress Notes (Signed)
When patient came back from IR her parents were upset that nobody told them that the patient will have the procedure done today. Patient is alert and oriented, able to take decisions for herself and writer tried to explained this to her parents. Patient was crying and she said that she wanted her mom to be with her but nobody called her. They asked to talk with the doctor. Writer paged Dr. Pascal Lux but he wasn't available at that time. His nurse said that a PA will come in the patient's room and talk with the family. When writer returned to patient's room, her parents said that they are OK right now, they understand that the procedure must be done ASAP and there is no need to talk with the doctor anymore. When Dr. Pascal Lux called writer back he was informed about the conversation.

## 2018-09-23 NOTE — Progress Notes (Signed)
Patient back from IR. Alert and oriented, complaining of pain on the [place] site. VS stable. [place] site clean, dry, intact. Will continue to monitor.

## 2018-09-24 LAB — GC/CHLAMYDIA PROBE AMP (~~LOC~~) NOT AT ARMC
Chlamydia: NEGATIVE
Neisseria Gonorrhea: NEGATIVE

## 2018-09-24 LAB — RENAL FUNCTION PANEL
ALBUMIN: 2.3 g/dL — AB (ref 3.5–5.0)
ANION GAP: 8 (ref 5–15)
BUN: 15 mg/dL (ref 6–20)
CO2: 25 mmol/L (ref 22–32)
Calcium: 7.6 mg/dL — ABNORMAL LOW (ref 8.9–10.3)
Chloride: 101 mmol/L (ref 98–111)
Creatinine, Ser: 2.36 mg/dL — ABNORMAL HIGH (ref 0.44–1.00)
GFR calc non Af Amer: 26 mL/min — ABNORMAL LOW (ref >60)
GFR, EST AFRICAN AMERICAN: 30 mL/min — AB (ref >60)
GLUCOSE: 84 mg/dL (ref 70–99)
PHOSPHORUS: 5.1 mg/dL — AB (ref 2.5–4.6)
POTASSIUM: 4.2 mmol/L (ref 3.5–5.1)
SODIUM: 134 mmol/L — AB (ref 135–145)

## 2018-09-24 LAB — CBC
HCT: 24.6 % — ABNORMAL LOW (ref 36.0–46.0)
HEMOGLOBIN: 8.4 g/dL — AB (ref 12.0–15.0)
MCH: 29.2 pg (ref 26.0–34.0)
MCHC: 34.1 g/dL (ref 30.0–36.0)
MCV: 85.4 fL (ref 80.0–100.0)
NRBC: 0 % (ref 0.0–0.2)
Platelets: 171 10*3/uL (ref 150–400)
RBC: 2.88 MIL/uL — AB (ref 3.87–5.11)
RDW: 11.9 % (ref 11.5–15.5)
WBC: 5.1 10*3/uL (ref 4.0–10.5)

## 2018-09-24 MED ORDER — FAMOTIDINE 20 MG PO TABS
20.0000 mg | ORAL_TABLET | Freq: Two times a day (BID) | ORAL | Status: DC
  Administered 2018-09-24 – 2018-09-29 (×10): 20 mg via ORAL
  Filled 2018-09-24 (×10): qty 1

## 2018-09-24 MED ORDER — SODIUM CHLORIDE 0.9 % IV SOLN
1000.0000 mg | Freq: Every day | INTRAVENOUS | Status: AC
  Administered 2018-09-24 – 2018-09-26 (×3): 1000 mg via INTRAVENOUS
  Filled 2018-09-24 (×3): qty 8

## 2018-09-24 MED ORDER — ENOXAPARIN SODIUM 30 MG/0.3ML ~~LOC~~ SOLN: 30.0000 mg | SUBCUTANEOUS | Status: DC

## 2018-09-24 MED ORDER — ENOXAPARIN SODIUM 30 MG/0.3ML ~~LOC~~ SOLN
30.0000 mg | SUBCUTANEOUS | Status: DC
  Administered 2018-09-25: 30 mg via SUBCUTANEOUS
  Filled 2018-09-24: qty 0.3

## 2018-09-24 NOTE — Progress Notes (Signed)
S:Still having some Headache O:BP 136/83 (BP Location: Left Arm)   Pulse 67   Temp 98.5 F (36.9 C) (Oral)   Resp 18   Wt 65.9 kg   LMP 06/22/2018   SpO2 100%   BMI 22.75 kg/m   Intake/Output Summary (Last 24 hours) at 09/24/2018 1224 Last data filed at 09/24/2018 2956 Gross per 24 hour  Intake 2425.22 ml  Output 352 ml  Net 2073.22 ml   Intake/Output: I/O last 3 completed shifts: In: 4384.1 [P.O.:690; I.V.:2944.1; Blood:750] Out: 353 [Urine:352; Stool:1]  Intake/Output this shift:  No intake/output data recorded. Weight change:  Gen:NAD CVS: no rub Resp: cta Abd: distended/pregnant, + tenderness no rebound/guarding Ext: no edema  Recent Labs  Lab 09/20/18 1323 09/20/18 1441 09/20/18 2043 09/21/18 0251 09/21/18 0853 09/22/18 0357 09/23/18 0347 09/24/18 0445  NA 131* 132* 132* 134*  --  134* 131* 134*  K 7.0* 6.1* 5.5* 5.1  --  4.6 4.7 4.2  CL 110 110 109 108  --  105 106 101  CO2 17* 17* 18* 20*  --  23 22 25   GLUCOSE 81 108* 90 99  --  85 85 84  BUN 24* 24* 19 17  --  13 14 15   CREATININE 2.44* 2.51* 2.43* 2.38*  --  2.27* 2.37* 2.36*  ALBUMIN 3.0* 3.0*  --  2.4*  --  2.3*  2.2* 2.3* 2.3*  CALCIUM 8.1* 8.0* 7.8* 8.2*  --  7.8* 7.7* 7.6*  PHOS  --   --   --   --  4.8* 5.4* 4.7* 5.1*  AST 27 27  --  22  --  18  --   --   ALT 37 36  --  29  --  24  --   --    Liver Function Tests: Recent Labs  Lab 09/20/18 1441 09/21/18 0251 09/22/18 0357 09/23/18 0347 09/24/18 0445  AST 27 22 18   --   --   ALT 36 29 24  --   --   ALKPHOS 56 53 51  --   --   BILITOT 0.4 0.4 0.4  --   --   PROT 7.6 6.3* 6.2*  --   --   ALBUMIN 3.0* 2.4* 2.3*  2.2* 2.3* 2.3*   Recent Labs  Lab 09/20/18 1323  LIPASE 28   No results for input(s): AMMONIA in the last 168 hours. CBC: Recent Labs  Lab 09/20/18 1323 09/21/18 0251 09/22/18 0357 09/23/18 0347 09/24/18 0445  WBC 6.1 4.8 4.3 5.0 5.1  NEUTROABS 4.9  --   --   --   --   HGB 8.0* 7.0* 6.9* 8.9* 8.4*  HCT 22.3*  21.7* 20.3* 26.5* 24.6*  MCV 83.8 87.1 84.6 86.3 85.4  PLT 221 217 211 188 171   Cardiac Enzymes: Recent Labs  Lab 09/20/18 2043 09/21/18 0251 09/21/18 0853  TROPONINI <0.03 <0.03 <0.03   CBG: No results for input(s): GLUCAP in the last 168 hours.  Iron Studies:  Recent Labs    09/22/18 0357  IRON 71  TIBC 249*  FERRITIN 212   Studies/Results: US Biopsy (kidney)  Result Date: 09/23/2018 INDICATION: Worsening renal function, concern for lupus nephritis. Please from ultrasound-guided random renal biopsy for tissue diagnostic purposes. EXAM: ULTRASOUND GUIDED RENAL BIOPSY COMPARISON:  None. MEDICATIONS: None. ANESTHESIA/SEDATION: Fentanyl 50 mcg IV Total Moderate Sedation time: 10 minutes; The patient was continuously monitored during the procedure by the interventional radiology nurse under my direct supervision. COMPLICATIONS: None immediate.  PROCEDURE: Informed written consent was obtained from the patient after a discussion of the risks, benefits and alternatives to treatment. The patient understands and consents the procedure. A timeout was performed prior to the initiation of the procedure. Ultrasound scanning was performed of the bilateral flanks. The inferior pole of the left kidney was selected for biopsy due to location and sonographic window. The procedure was planned. The operative site was prepped and draped in the usual sterile fashion. The overlying soft tissues were anesthetized with 1% lidocaine with epinephrine. A 17 gauge core needle biopsy device was advanced into the inferior cortex of the left kidney and a core needle biopsy was attempted to be acquired however was unsuccessful given device failure. As such, a new device was obtained and 2 core biopsies were obtained of the inferior pole the left kidney under direct ultrasound guidance. Images were saved for documentation purposes. The biopsy device was removed and hemostasis was obtained with manual compression. Post  procedural scanning was negative for significant post procedural hemorrhage or additional complication. A dressing was placed. The patient tolerated the procedure well without immediate post procedural complication. IMPRESSION: Technically successful ultrasound guided left renal biopsy. Electronically Signed   By: Sandi Mariscal M.D.   On: 09/23/2018 12:42   . sodium chloride   Intravenous Once  . hydrocortisone cream   Topical TID  . patiromer  8.4 g Oral Daily  . prenatal multivitamin  1 tablet Oral Q1200    BMET    Component Value Date/Time   NA 134 (L) 09/24/2018 0445   K 4.2 09/24/2018 0445   CL 101 09/24/2018 0445   CO2 25 09/24/2018 0445   GLUCOSE 84 09/24/2018 0445   BUN 15 09/24/2018 0445   CREATININE 2.36 (H) 09/24/2018 0445   CALCIUM 7.6 (L) 09/24/2018 0445   GFRNONAA 26 (L) 09/24/2018 0445   GFRAA 30 (L) 09/24/2018 0445   CBC    Component Value Date/Time   WBC 5.1 09/24/2018 0445   RBC 2.88 (L) 09/24/2018 0445   HGB 8.4 (L) 09/24/2018 0445   HCT 24.6 (L) 09/24/2018 0445   PLT 171 09/24/2018 0445   MCV 85.4 09/24/2018 0445   MCH 29.2 09/24/2018 0445   MCHC 34.1 09/24/2018 0445   RDW 11.9 09/24/2018 0445   LYMPHSABS 1.0 09/20/2018 1323   MONOABS 0.2 09/20/2018 1323   EOSABS 0.1 09/20/2018 1323   BASOSABS 0.0 09/20/2018 1323   Assessment/Plan: 1. AKI- in setting of [redacted] weeks gestation and history of Lupus as well as N/anorexia and diarrhea. 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 1. Low C3 and C4, ANA + 1:1280, speckled 2. ASO, anti-GBM negative,negative dsDNA. 3. renal USpending 4. FeNa>1%.  5. Willstop IVF's and follow renal function closely.  6. S/p renal biopsy 09/23/18 and appreciate IRassistance 7. Unfortunately we can not use immunosuppressive agents with her pregnancy and may lead to some hard decisions.Will start empiric solumedrol  after and follow. 2. Hyperkalemia- due to AKI.Improvedwithisotonic bicarb and veltassa.  3. SLE- diagnosed in August 2019 not on any medications 4. [redacted] weeks gestation- per OB 5. Diarrhea- stool cultures and viral studiespending. No recent abx. 6. Anemia- significant drop over the past 2 months. Guaiac stools and follow H/H  7. Disposition- Currently residing in an homeless shelter.  Donetta Potts, MD [employer] [phone]

## 2018-09-24 NOTE — Progress Notes (Signed)
Cathy [name]  YWV:[id number] DOB: 1981-10-19 DOA: 09/20/2018 PCP: Patient, No Pcp Per    Brief Narrative:   37 y.o. F w/ a hx ofbipolar disorder, recent diagnosis of SLE, homelessness, PID/gonorrhea and trichomonas infections, and depression who presented w/ a week of nausea vomiting diarrhea and abdominal pain. She initially presented to [facility] as she is currently [redacted] weeks pregnant.  She did not receive any prenatal care  Her evaluation revealed K+ of 7.3, creatinine 2.51, UA w/ large blood. An US showed normal fetal movement. She was transferred to [facility].  Subjective: Still reports some vaginal discharge, fever, chills, still reports the rash which is itchy in her inner thigh area . Assessment & Plan:  Acute renal failure  Treatment per renal, there is concern for acute lupus flare, especially in the setting of blood and the protein in the urine, with low C3 and C4, positive ANA speckled, . -Renal biopsy was done 09/23/2018 by IR, with pathology, to send out, will take few days before it comes back. -Started on Solu-Medrol 1 g IV daily for next 3 days, I have discussed with renal, will stop her bicarb drip and monitor renal function of fluids.  Recent Labs  Lab 09/20/18 2043 09/21/18 0251 09/22/18 0357 09/23/18 0347 09/24/18 0445  CREATININE 2.43* 2.38* 2.27* 2.37* 2.36*    Hyperkalemia  Corrected   Hypomagnesemia  Due to GI losses -repleted  Diarrhea  Etiology not clear -resolved  Normocytic Anemia  -hemoglobin dropped to 6.9 after hydration, transfused total of 2 units, with good response. -Vitamin B12 in the low range, will be repleted.  B12 deficiency -Started on IM supplements, folate within normal limits  Recently diagnosed SLE Presented w/ malar rash August 2019  Pregnancy - 16 weeks , started on prenatal vitamins, did not receive any prenatal care, -He does report some vaginal discharge today, discussed with OB, follow   chlamydia  and gonorrhea Tobacco abuse --She was counseled, currently on nicotine patch  Bipolar D/O  DVT prophylaxis: SCDs, is on Lovenox Code Status: FULL CODE Family Communication: I have discussed with mother at bedside  disposition Plan: home when stable  Procedures: Ultrasound-guided renal biopsy by IR 09/23/2018  Consultants:  Nephrology  IR  Antimicrobials:  none  Objective: Blood pressure 116/73, pulse 62, temperature 98.9 F (37.2 C), temperature source Oral, resp. rate 16, weight 65.9 kg, last menstrual period 06/22/2018, SpO2 98 %.  Intake/Output Summary (Last 24 hours) at 09/24/2018 1442 Last data filed at 09/24/2018 1416 Gross per 24 hour  Intake 2725.22 ml  Output 4 ml  Net 2721.22 ml   Filed Weights   09/20/18 1224  Weight: 65.9 kg    Examination:  Awake Alert, Oriented X 3, No new F.N deficits, Normal affect Symmetrical Chest wall movement, Good air movement bilaterally, CTAB RRR,No Gallops,Rubs or new Murmurs, No Parasternal Heave +ve B.Sounds, Abd Soft, Gravid+, No rebound - guarding or rigidity. No Cyanosis, Clubbing or edema, No new Rash or bruise      CBC: Recent Labs  Lab 09/20/18 1323  09/22/18 0357 09/23/18 0347 09/24/18 0445  WBC 6.1   < > 4.3 5.0 5.1  NEUTROABS 4.9  --   --   --   --   HGB 8.0*   < > 6.9* 8.9* 8.4*  HCT 22.3*   < > 20.3* 26.5* 24.6*  MCV 83.8   < > 84.6 86.3 85.4  PLT 221   < > 211 188 171   < > =  values in this interval not displayed.   Basic Metabolic Panel: Recent Labs  Lab 09/21/18 0853 09/22/18 0357 09/23/18 0347 09/24/18 0445  NA  --  134* 131* 134*  K  --  4.6 4.7 4.2  CL  --  105 106 101  CO2  --  23 22 25   GLUCOSE  --  85 85 84  BUN  --  13 14 15   CREATININE  --  2.27* 2.37* 2.36*  CALCIUM  --  7.8* 7.7* 7.6*  MG 1.5*  --   --   --   PHOS 4.8* 5.4* 4.7* 5.1*   GFR: Estimated Creatinine Clearance: 32.7 mL/min (A) (by C-G formula based on SCr of 2.36 mg/dL (H)).  Liver Function Tests: Recent Labs   Lab 09/20/18 1323 09/20/18 1441 09/21/18 0251 09/22/18 0357 09/23/18 0347 09/24/18 0445  AST 27 27 22 18   --   --   ALT 37 36 29 24  --   --   ALKPHOS 57 56 53 51  --   --   BILITOT 0.4 0.4 0.4 0.4  --   --   PROT 7.5 7.6 6.3* 6.2*  --   --   ALBUMIN 3.0* 3.0* 2.4* 2.3*  2.2* 2.3* 2.3*   Recent Labs  Lab 09/20/18 1323  LIPASE 28    Cardiac Enzymes: Recent Labs  Lab 09/20/18 2043 09/21/18 0251 09/21/18 0853  TROPONINI <0.03 <0.03 <0.03     Recent Results (from the past 240 hour(s))  Culture, OB Urine     Status: None   Collection Time: 09/20/18 12:38 PM  Result Value Ref Range Status   Specimen Description   Final    URINE, CLEAN CATCH Performed at [facility], [address], [place], [place] [address]    Special Requests   Final    NONE Performed at [facility], [address], [place], [place] [address]    Culture   Final    NO GROWTH NO GROUP B STREP (S.AGALACTIAE) ISOLATED Performed at [facility], [place] [address], [place], [place] [address]    Report Status 09/21/2018 FINAL  Final  Gastrointestinal Panel by PCR , Stool     Status: None   Collection Time: 09/21/18  2:11 AM  Result Value Ref Range Status   Campylobacter species NOT DETECTED NOT DETECTED Final   Plesimonas shigelloides NOT DETECTED NOT DETECTED Final   Salmonella species NOT DETECTED NOT DETECTED Final   Yersinia enterocolitica NOT DETECTED NOT DETECTED Final   Vibrio species NOT DETECTED NOT DETECTED Final   Vibrio cholerae NOT DETECTED NOT DETECTED Final   Enteroaggregative E coli (EAEC) NOT DETECTED NOT DETECTED Final   Enteropathogenic E coli (EPEC) NOT DETECTED NOT DETECTED Final   Enterotoxigenic E coli (ETEC) NOT DETECTED NOT DETECTED Final   Shiga like toxin producing E coli (STEC) NOT DETECTED NOT DETECTED Final   Shigella/Enteroinvasive E coli (EIEC) NOT DETECTED NOT DETECTED Final   Cryptosporidium NOT DETECTED NOT DETECTED Final   Cyclospora cayetanensis  NOT DETECTED NOT DETECTED Final   Entamoeba histolytica NOT DETECTED NOT DETECTED Final   Giardia lamblia NOT DETECTED NOT DETECTED Final   Adenovirus F40/41 NOT DETECTED NOT DETECTED Final   Astrovirus NOT DETECTED NOT DETECTED Final   Norovirus GI/GII NOT DETECTED NOT DETECTED Final   Rotavirus A NOT DETECTED NOT DETECTED Final   Sapovirus (I, II, IV, and V) NOT DETECTED NOT DETECTED Final    Comment: Performed at [facility], [address] [place], [place] [address]  Respiratory Panel by PCR     Status: None   Collection Time: 09/21/18  5:07 AM  Result Value Ref Range Status   Adenovirus NOT DETECTED NOT DETECTED Final   Coronavirus 229E NOT DETECTED NOT DETECTED Final   Coronavirus HKU1 NOT DETECTED NOT DETECTED Final   Coronavirus NL63 NOT DETECTED NOT DETECTED Final   Coronavirus OC43 NOT DETECTED NOT DETECTED Final   Metapneumovirus NOT DETECTED NOT DETECTED Final   Rhinovirus / Enterovirus NOT DETECTED NOT DETECTED Final   Influenza A NOT DETECTED NOT DETECTED Final   Influenza B NOT DETECTED NOT DETECTED Final   Parainfluenza Virus 1 NOT DETECTED NOT DETECTED Final   Parainfluenza Virus 2 NOT DETECTED NOT DETECTED Final   Parainfluenza Virus 3 NOT DETECTED NOT DETECTED Final   Parainfluenza Virus 4 NOT DETECTED NOT DETECTED Final   Respiratory Syncytial Virus NOT DETECTED NOT DETECTED Final   Bordetella pertussis NOT DETECTED NOT DETECTED Final   Chlamydophila pneumoniae NOT DETECTED NOT DETECTED Final   Mycoplasma pneumoniae NOT DETECTED NOT DETECTED Final    Comment: Performed at [facility], [place] [address] [place], [place] [address]  MRSA PCR Screening     Status: None   Collection Time: 09/21/18  9:44 AM  Result Value Ref Range Status   MRSA by PCR NEGATIVE NEGATIVE Final    Comment:        The GeneXpert MRSA Assay (FDA approved for NASAL specimens only), is one component of a comprehensive MRSA colonization surveillance program. It is  not intended to diagnose MRSA infection nor to guide or monitor treatment for MRSA infections. Performed at [facility], [place] [address], [place], [place] [address]   Urine Culture     Status: Abnormal   Collection Time: 09/21/18  6:20 PM  Result Value Ref Range Status   Specimen Description URINE, RANDOM  Final   Special Requests NONE  Final   Culture (A)  Final    <10,000 COLONIES/mL INSIGNIFICANT GROWTH Performed at [facility], [place] [address], [place], [place] [address]    Report Status 09/22/2018 FINAL  Final  Wet prep, genital     Status: Abnormal   Collection Time: 09/23/18  5:09 PM  Result Value Ref Range Status   Yeast Wet Prep HPF POC NONE SEEN NONE SEEN Final    Comment: Specimen diluted due to transport tube containing more than 1 ml of saline, interpret results with caution.   Trich, Wet Prep NONE SEEN NONE SEEN Final   Clue Cells Wet Prep HPF POC PRESENT (A) NONE SEEN Final   WBC, Wet Prep HPF POC FEW (A) NONE SEEN Final   Sperm NONE SEEN  Final    Comment: Performed at [facility], [place] [address] [place], [place] [address]     Scheduled Meds: . sodium chloride   Intravenous Once  . enoxaparin (LOVENOX) injection  30 mg Subcutaneous Q24H  . famotidine  20 mg Oral BID  . hydrocortisone cream   Topical TID  . patiromer  8.4 g Oral Daily  . prenatal multivitamin  1 tablet Oral Q1200   Continuous Infusions: . methylPREDNISolone (SOLU-MEDROL) injection 1,000 mg (09/24/18 1423)     LOS: 4 days   Phillips Climes, MD [facility] Office  [phone] Pager - Text Page per Amion  If 7PM-7AM, please contact night-coverage per Amion 09/24/2018, 2:42 PM

## 2018-09-25 LAB — RENAL FUNCTION PANEL
ANION GAP: 9 (ref 5–15)
Albumin: 2.3 g/dL — ABNORMAL LOW (ref 3.5–5.0)
BUN: 19 mg/dL (ref 6–20)
CHLORIDE: 103 mmol/L (ref 98–111)
CO2: 21 mmol/L — ABNORMAL LOW (ref 22–32)
Calcium: 7.9 mg/dL — ABNORMAL LOW (ref 8.9–10.3)
Creatinine, Ser: 2.51 mg/dL — ABNORMAL HIGH (ref 0.44–1.00)
GFR calc Af Amer: 28 mL/min — ABNORMAL LOW (ref >60)
GFR calc non Af Amer: 24 mL/min — ABNORMAL LOW (ref >60)
Glucose, Bld: 137 mg/dL — ABNORMAL HIGH (ref 70–99)
POTASSIUM: 4.4 mmol/L (ref 3.5–5.1)
Phosphorus: 4 mg/dL (ref 2.5–4.6)
Sodium: 133 mmol/L — ABNORMAL LOW (ref 135–145)

## 2018-09-25 LAB — HCV AB W REFLEX TO QUANT PCR: HCV Ab: <0.1 s/co ratio (ref 0.0–0.9)

## 2018-09-25 LAB — HEPATITIS B SURFACE ANTIGEN: HEP B S AG: NEGATIVE

## 2018-09-25 LAB — HEPATITIS B SURFACE ANTIBODY, QUANTITATIVE: Hepatitis B-Post: 36 m[IU]/mL (ref >9.9)

## 2018-09-25 LAB — RPR: RPR Ser Ql: NONREACTIVE

## 2018-09-25 LAB — HCV INTERPRETATION

## 2018-09-25 MED ORDER — CYANOCOBALAMIN 1000 MCG/ML IJ SOLN
1000.0000 ug | INTRAMUSCULAR | Status: DC
  Administered 2018-09-27: 1000 ug via INTRAMUSCULAR
  Filled 2018-09-25: qty 1

## 2018-09-25 MED ORDER — METRONIDAZOLE 500 MG PO TABS
500.0000 mg | ORAL_TABLET | Freq: Two times a day (BID) | ORAL | Status: DC
  Administered 2018-09-25 – 2018-09-29 (×8): 500 mg via ORAL
  Filled 2018-09-25 (×8): qty 1

## 2018-09-25 MED ORDER — AZATHIOPRINE 50 MG PO TABS
50.0000 mg | ORAL_TABLET | Freq: Two times a day (BID) | ORAL | Status: DC
  Administered 2018-09-25 – 2018-09-29 (×8): 50 mg via ORAL
  Filled 2018-09-25 (×11): qty 1

## 2018-09-25 NOTE — Progress Notes (Signed)
Per ED RN Mid suprapubic fetal heartrate 141

## 2018-09-25 NOTE — Progress Notes (Signed)
S:Feels a little better but still having lower abd pain O:BP 126/87 (BP Location: Left Arm)   Pulse (!) 52   Temp 98.1 F (36.7 C)   Resp 16   Wt 70.2 kg   LMP 06/22/2018   SpO2 99%   BMI 24.24 kg/m   Intake/Output Summary (Last 24 hours) at 09/25/2018 1331 Last data filed at 09/24/2018 1500 Gross per 24 hour  Intake -  Output 2 ml  Net -2 ml   Intake/Output: I/O last 3 completed shifts: In: 2250 [P.O.:1050; I.V.:1200] Out: 5 [Urine:5]  Intake/Output this shift:  No intake/output data recorded. Weight change:  Gen: NAD CVS: no rub Resp: cta Abd: 16 weeks IUP, +BS, soft, mild tenderness  Ext: no edema  Recent Labs  Lab 09/20/18 1323 09/20/18 1441 09/20/18 2043 09/21/18 0251 09/21/18 0853 09/22/18 0357 09/23/18 0347 09/24/18 0445 09/25/18 0420  NA 131* 132* 132* 134*  --  134* 131* 134* 133*  K 7.0* 6.1* 5.5* 5.1  --  4.6 4.7 4.2 4.4  CL 110 110 109 108  --  105 106 101 103  CO2 17* 17* 18* 20*  --  23 22 25  21*  GLUCOSE 81 108* 90 99  --  85 85 84 137*  BUN 24* 24* 19 17  --  13 14 15 19   CREATININE 2.44* 2.51* 2.43* 2.38*  --  2.27* 2.37* 2.36* 2.51*  ALBUMIN 3.0* 3.0*  --  2.4*  --  2.3*  2.2* 2.3* 2.3* 2.3*  CALCIUM 8.1* 8.0* 7.8* 8.2*  --  7.8* 7.7* 7.6* 7.9*  PHOS  --   --   --   --  4.8* 5.4* 4.7* 5.1* 4.0  AST 27 27  --  22  --  18  --   --   --   ALT 37 36  --  29  --  24  --   --   --    Liver Function Tests: Recent Labs  Lab 09/20/18 1441 09/21/18 0251 09/22/18 0357 09/23/18 0347 09/24/18 0445 09/25/18 0420  AST 27 22 18   --   --   --   ALT 36 29 24  --   --   --   ALKPHOS 56 53 51  --   --   --   BILITOT 0.4 0.4 0.4  --   --   --   PROT 7.6 6.3* 6.2*  --   --   --   ALBUMIN 3.0* 2.4* 2.3*  2.2* 2.3* 2.3* 2.3*   Recent Labs  Lab 09/20/18 1323  LIPASE 28   No results for input(s): AMMONIA in the last 168 hours. CBC: Recent Labs  Lab 09/20/18 1323 09/21/18 0251 09/22/18 0357 09/23/18 0347 09/24/18 0445  WBC 6.1 4.8 4.3 5.0  5.1  NEUTROABS 4.9  --   --   --   --   HGB 8.0* 7.0* 6.9* 8.9* 8.4*  HCT 22.3* 21.7* 20.3* 26.5* 24.6*  MCV 83.8 87.1 84.6 86.3 85.4  PLT 221 217 211 188 171   Cardiac Enzymes: Recent Labs  Lab 09/20/18 2043 09/21/18 0251 09/21/18 0853  TROPONINI <0.03 <0.03 <0.03   CBG: No results for input(s): GLUCAP in the last 168 hours.  Iron Studies: No results for input(s): IRON, TIBC, TRANSFERRIN, FERRITIN in the last 72 hours. Studies/Results: No results found. . sodium chloride   Intravenous Once  . enoxaparin (LOVENOX) injection  30 mg Subcutaneous Q24H  . famotidine  20 mg Oral BID  .  hydrocortisone cream   Topical TID  . patiromer  8.4 g Oral Daily  . prenatal multivitamin  1 tablet Oral Q1200    BMET    Component Value Date/Time   NA 133 (L) 09/25/2018 0420   K 4.4 09/25/2018 0420   CL 103 09/25/2018 0420   CO2 21 (L) 09/25/2018 0420   GLUCOSE 137 (H) 09/25/2018 0420   BUN 19 09/25/2018 0420   CREATININE 2.51 (H) 09/25/2018 0420   CALCIUM 7.9 (L) 09/25/2018 0420   GFRNONAA 24 (L) 09/25/2018 0420   GFRAA 28 (L) 09/25/2018 0420   CBC    Component Value Date/Time   WBC 5.1 09/24/2018 0445   RBC 2.88 (L) 09/24/2018 0445   HGB 8.4 (L) 09/24/2018 0445   HCT 24.6 (L) 09/24/2018 0445   PLT 171 09/24/2018 0445   MCV 85.4 09/24/2018 0445   MCH 29.2 09/24/2018 0445   MCHC 34.1 09/24/2018 0445   RDW 11.9 09/24/2018 0445   LYMPHSABS 1.0 09/20/2018 1323   MONOABS 0.2 09/20/2018 1323   EOSABS 0.1 09/20/2018 1323   BASOSABS 0.0 09/20/2018 1323      Assessment/Plan: 1. AKI- in setting of [redacted] weeks gestation and history of Lupus as well as N/anorexia and diarrhea. 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  Hopefully this will respond no alternative agents that are safe for her fetus (Imuran, cyclosporine, and tacrolimus have been shown to be relatively safe during pregnancy).  She is on  solumedrol bolus and will reach out to [facility] for recommendations on immunosuppressive agents but I am leaning towards azothioprine as she will not require regular trough levels since she is living in a shelter and has lower nephrotoxiciy compared to cyclosporine and tacrolimus. 1. Will start imuran 50mg  bid (not to exceed 2 mg/kg/day) and follow 2. Cr rising since stopping IVF"s, if continues to rise will resume IVF's tomorrow. 2. Hyperkalemia- due to AKI.Improvedwithisotonic bicarb and veltassa.  3. SLE- diagnosed in August 2019 and was not on any medications PTA 4. [redacted] weeks gestation- per OB 5. Diarrhea- stool cultures and viral studiesnegative. No recent abx. 6. Anemia- significant drop over the past 2 months. Guaiac stools and follow H/H  7. Disposition- Currently residing in an homeless shelter and will plan for discharge hopefully by Monday if her renal function stabilizes.  Donetta Potts, MD [employer] [phone]

## 2018-09-25 NOTE — Progress Notes (Signed)
Cathy [name]  OBS:[id number] DOB: 1981/03/08 DOA: 09/20/2018 PCP: Patient, No Pcp Per    Brief Narrative:   37 y.o. F w/ a hx ofbipolar disorder, recent diagnosis of SLE, homelessness, PID/gonorrhea and trichomonas infections, and depression who presented w/ a week of nausea vomiting diarrhea and abdominal pain. She initially presented to [facility] as she is currently [redacted] weeks pregnant.  She did not receive any prenatal care  Her evaluation revealed K+ of 7.3, creatinine 2.51, UA w/ large blood. An US showed normal fetal movement. She was transferred to [facility].  Subjective: Reports that her rash has resolved overnight, as well no further vaginal discharge, she does report some abdominal cramps after receiving Lovenox subcu for DVT prophylaxis .  assessment & Plan:  Acute renal failure  - concern for acute lupus flare, especially in the setting of blood and the protein in the urine, with low C3 and C4, positive ANA speckled, . 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  Started on Imuran 50 mg twice daily. -Renal biopsy was done 09/23/2018 by IR, with pathology, to send out, will take few days before it comes back.  Recent Labs  Lab 09/21/18 0251 09/22/18 0357 09/23/18 0347 09/24/18 0445 09/25/18 0420  CREATININE 2.38* 2.27* 2.37* 2.36* 2.51*    Hyperkalemia  Corrected   Hypomagnesemia  Due to GI losses -repleted  Diarrhea  Etiology not clear -resolved  Normocytic Anemia  -hemoglobin dropped to 6.9 after hydration, transfused total of 2 units, with good response. -Vitamin B12 in the low range, will be repleted.  B12 deficiency -Started  on IM supplements, folate within normal limits  Recently diagnosed SLE Presented w/ malar rash August 2019  Pregnancy - 16 weeks  - , started on prenatal vitamins, did not receive any prenatal care, -She has some complaints of vaginal discharge, which has resolved, her gonorrhea/chlamydia were negative as well her wet prep -He did have some abdominal spasms today after she received subcu Lovenox, I have reassured the patient works as safe during pregnancy, as we will have discussed with OB who confirmed that finding, asked nursing staff to have 1 of the maternity/ED nurses performed Doppler for heartbeat to reassure the patient. Tobacco abuse --She was counseled, currently on nicotine patch  Bipolar D/O  DVT prophylaxis: SCDs,  on Lovenox Code Status: FULL CODE Family Communication: I have discussed with mother at bedside disposition Plan: home when stable  Procedures: Ultrasound-guided renal biopsy by IR 09/23/2018  Consultants:  Nephrology  IR  Antimicrobials:  none  Objective: Blood pressure 126/87, pulse (!) 52, temperature 98.1 F (36.7 C), resp. rate 16, weight 70.2 kg, last menstrual period 06/22/2018, SpO2 99 %.  Intake/Output Summary (Last 24 hours) at 09/25/2018 1423 Last data filed at 09/24/2018 1500 Gross per 24 hour  Intake -  Output 1 ml  Net -1 ml   Filed Weights   09/20/18 1224 09/25/18 0624  Weight: 65.9 kg 70.2 kg    Examination:  Awake alert oriented x3 in no apparent distress, Good air entry bilaterally, no wheezing rales rhonchi Regular rate and rhythm, no rubs or murmurs gallops Abdomen soft, nontender, nondistended, gravid plus, site of biopsy in left lower back with no  hematoma or blood Extremity with no edema, clubbing or cyanosis  Patient nurse was present at bedside during my exam and assessment    CBC: Recent Labs  Lab 09/20/18 1323  09/22/18 0357 09/23/18 0347 09/24/18 0445  WBC 6.1   < > 4.3 5.0 5.1  NEUTROABS 4.9  --   --    --   --   HGB 8.0*   < > 6.9* 8.9* 8.4*  HCT 22.3*   < > 20.3* 26.5* 24.6*  MCV 83.8   < > 84.6 86.3 85.4  PLT 221   < > 211 188 171   < > = values in this interval not displayed.   Basic Metabolic Panel: Recent Labs  Lab 09/21/18 0853  09/23/18 0347 09/24/18 0445 09/25/18 0420  NA  --    < > 131* 134* 133*  K  --    < > 4.7 4.2 4.4  CL  --    < > 106 101 103  CO2  --    < > 22 25 21*  GLUCOSE  --    < > 85 84 137*  BUN  --    < > 14 15 19   CREATININE  --    < > 2.37* 2.36* 2.51*  CALCIUM  --    < > 7.7* 7.6* 7.9*  MG 1.5*  --   --   --   --   PHOS 4.8*   < > 4.7* 5.1* 4.0   < > = values in this interval not displayed.   GFR: Estimated Creatinine Clearance: 30.7 mL/min (A) (by C-G formula based on SCr of 2.51 mg/dL (H)).  Liver Function Tests: Recent Labs  Lab 09/20/18 1323 09/20/18 1441 09/21/18 0251 09/22/18 0357 09/23/18 0347 09/24/18 0445 09/25/18 0420  AST 27 27 22 18   --   --   --   ALT 37 36 29 24  --   --   --   ALKPHOS 57 56 53 51  --   --   --   BILITOT 0.4 0.4 0.4 0.4  --   --   --   PROT 7.5 7.6 6.3* 6.2*  --   --   --   ALBUMIN 3.0* 3.0* 2.4* 2.3*  2.2* 2.3* 2.3* 2.3*   Recent Labs  Lab 09/20/18 1323  LIPASE 28    Cardiac Enzymes: Recent Labs  Lab 09/20/18 2043 09/21/18 0251 09/21/18 0853  TROPONINI <0.03 <0.03 <0.03     Recent Results (from the past 240 hour(s))  Culture, OB Urine     Status: None   Collection Time: 09/20/18 12:38 PM  Result Value Ref Range Status   Specimen Description   Final    URINE, CLEAN CATCH Performed at [facility], [address], [place], [place] [address]    Special Requests   Final    NONE Performed at [facility], [address], [place], [place] [address]    Culture   Final    NO GROWTH NO GROUP B STREP (S.AGALACTIAE) ISOLATED Performed at [facility], [place] [address] [place], [place] [address]    Report Status 09/21/2018 FINAL  Final  Gastrointestinal Panel by PCR , Stool      Status: None   Collection Time: 09/21/18  2:11 AM  Result Value Ref Range Status   Campylobacter species NOT DETECTED NOT DETECTED Final   Plesimonas shigelloides NOT DETECTED NOT DETECTED Final   Salmonella species NOT DETECTED NOT DETECTED Final  Yersinia enterocolitica NOT DETECTED NOT DETECTED Final   Vibrio species NOT DETECTED NOT DETECTED Final   Vibrio cholerae NOT DETECTED NOT DETECTED Final   Enteroaggregative E coli (EAEC) NOT DETECTED NOT DETECTED Final   Enteropathogenic E coli (EPEC) NOT DETECTED NOT DETECTED Final   Enterotoxigenic E coli (ETEC) NOT DETECTED NOT DETECTED Final   Shiga like toxin producing E coli (STEC) NOT DETECTED NOT DETECTED Final   Shigella/Enteroinvasive E coli (EIEC) NOT DETECTED NOT DETECTED Final   Cryptosporidium NOT DETECTED NOT DETECTED Final   Cyclospora cayetanensis NOT DETECTED NOT DETECTED Final   Entamoeba histolytica NOT DETECTED NOT DETECTED Final   Giardia lamblia NOT DETECTED NOT DETECTED Final   Adenovirus F40/41 NOT DETECTED NOT DETECTED Final   Astrovirus NOT DETECTED NOT DETECTED Final   Norovirus GI/GII NOT DETECTED NOT DETECTED Final   Rotavirus A NOT DETECTED NOT DETECTED Final   Sapovirus (I, II, IV, and V) NOT DETECTED NOT DETECTED Final    Comment: Performed at [facility], [address] [place], [place] [address]  Respiratory Panel by PCR     Status: None   Collection Time: 09/21/18  5:07 AM  Result Value Ref Range Status   Adenovirus NOT DETECTED NOT DETECTED Final   Coronavirus 229E NOT DETECTED NOT DETECTED Final   Coronavirus HKU1 NOT DETECTED NOT DETECTED Final   Coronavirus NL63 NOT DETECTED NOT DETECTED Final   Coronavirus OC43 NOT DETECTED NOT DETECTED Final   Metapneumovirus NOT DETECTED NOT DETECTED Final   Rhinovirus / Enterovirus NOT DETECTED NOT DETECTED Final   Influenza A NOT DETECTED NOT DETECTED Final   Influenza B NOT DETECTED NOT DETECTED Final   Parainfluenza Virus 1 NOT DETECTED NOT  DETECTED Final   Parainfluenza Virus 2 NOT DETECTED NOT DETECTED Final   Parainfluenza Virus 3 NOT DETECTED NOT DETECTED Final   Parainfluenza Virus 4 NOT DETECTED NOT DETECTED Final   Respiratory Syncytial Virus NOT DETECTED NOT DETECTED Final   Bordetella pertussis NOT DETECTED NOT DETECTED Final   Chlamydophila pneumoniae NOT DETECTED NOT DETECTED Final   Mycoplasma pneumoniae NOT DETECTED NOT DETECTED Final    Comment: Performed at [facility], [place] [address] [place], [place] [address]  MRSA PCR Screening     Status: None   Collection Time: 09/21/18  9:44 AM  Result Value Ref Range Status   MRSA by PCR NEGATIVE NEGATIVE Final    Comment:        The GeneXpert MRSA Assay (FDA approved for NASAL specimens only), is one component of a comprehensive MRSA colonization surveillance program. It is not intended to diagnose MRSA infection nor to guide or monitor treatment for MRSA infections. Performed at [facility], [place] [address], [place], [place] [address]   Urine Culture     Status: Abnormal   Collection Time: 09/21/18  6:20 PM  Result Value Ref Range Status   Specimen Description URINE, RANDOM  Final   Special Requests NONE  Final   Culture (A)  Final    <10,000 COLONIES/mL INSIGNIFICANT GROWTH Performed at [facility], [place] [address], [place], [place] [address]    Report Status 09/22/2018 FINAL  Final  Wet prep, genital     Status: Abnormal   Collection Time: 09/23/18  5:09 PM  Result Value Ref Range Status   Yeast Wet Prep HPF POC NONE SEEN NONE SEEN Final    Comment: Specimen diluted due to transport tube containing more than 1 ml  of saline, interpret results with caution.   Trich, Wet Prep NONE SEEN NONE SEEN Final   Clue Cells Wet Prep HPF POC PRESENT (A) NONE SEEN Final   WBC, Wet Prep HPF POC FEW (A) NONE SEEN Final   Sperm NONE SEEN  Final    Comment: Performed at [facility], [place] [address] [place], [place] [address]     Scheduled  Meds: . sodium chloride   Intravenous Once  . azaTHIOprine  50 mg Oral BID  . enoxaparin (LOVENOX) injection  30 mg Subcutaneous Q24H  . famotidine  20 mg Oral BID  . hydrocortisone cream   Topical TID  . patiromer  8.4 g Oral Daily  . prenatal multivitamin  1 tablet Oral Q1200   Continuous Infusions: . methylPREDNISolone (SOLU-MEDROL) injection 1,000 mg (09/25/18 1019)     LOS: 5 days   Phillips Climes, MD [facility] Office  [phone] Pager - Text Page per Amion  If 7PM-7AM, please contact night-coverage per Amion 09/25/2018, 2:23 PM

## 2018-09-25 NOTE — Progress Notes (Signed)
Printed information on Lupus nephritis from [facility] MD for patient.

## 2018-09-25 NOTE — Progress Notes (Signed)
Spoke to [employer] in ED around 1500 requesting to have fetal HR monitoring done. Said they would be up at some point to check her.

## 2018-09-26 LAB — RENAL FUNCTION PANEL
Albumin: 2.5 g/dL — ABNORMAL LOW (ref 3.5–5.0)
Anion gap: 10 (ref 5–15)
BUN: 28 mg/dL — ABNORMAL HIGH (ref 6–20)
CO2: 20 mmol/L — ABNORMAL LOW (ref 22–32)
Calcium: 8.3 mg/dL — ABNORMAL LOW (ref 8.9–10.3)
Chloride: 105 mmol/L (ref 98–111)
Creatinine, Ser: 2.7 mg/dL — ABNORMAL HIGH (ref 0.44–1.00)
GFR calc Af Amer: 25 mL/min — ABNORMAL LOW
GFR calc non Af Amer: 22 mL/min — ABNORMAL LOW
Glucose, Bld: 113 mg/dL — ABNORMAL HIGH (ref 70–99)
Phosphorus: 4.5 mg/dL (ref 2.5–4.6)
Potassium: 4 mmol/L (ref 3.5–5.1)
Sodium: 135 mmol/L (ref 135–145)

## 2018-09-26 MED ORDER — NITROGLYCERIN 0.4 MG SL SUBL
SUBLINGUAL_TABLET | SUBLINGUAL | Status: AC
  Administered 2018-09-26: 0.4 mg via SUBLINGUAL
  Filled 2018-09-26: qty 3

## 2018-09-26 MED ORDER — SODIUM CHLORIDE 0.9 % IV SOLN
INTRAVENOUS | Status: DC
  Administered 2018-09-26 – 2018-09-28 (×3): via INTRAVENOUS

## 2018-09-26 MED ORDER — MORPHINE SULFATE (PF) 2 MG/ML IV SOLN
1.0000 mg | Freq: Once | INTRAVENOUS | Status: AC
  Administered 2018-09-26: 1 mg via INTRAVENOUS
  Filled 2018-09-26: qty 1

## 2018-09-26 MED ORDER — NITROGLYCERIN 0.4 MG SL SUBL
0.4000 mg | SUBLINGUAL_TABLET | SUBLINGUAL | Status: AC | PRN
  Administered 2018-09-26 (×3): 0.4 mg via SUBLINGUAL

## 2018-09-26 NOTE — Progress Notes (Signed)
[place], NP was notified that patient complained of 10/10 chest pain with radiation to her back.  She stated the pain was worse with inspiration/expiration.  RR RN notified and at bedside.  EKG obtained reading normal sinus rhythm.  NP ordered morphine, D-dimer, and troponin levels.  Troponin (-), but D-dimer elevated.  Results sent to NP. Nuclear study ordered.  Will continue to monitor patient.

## 2018-09-26 NOTE — Significant Event (Addendum)
Rapid Response Event Note Notified by staff on rounds of acute CP 10/10 in the setting of lupus/nephritis  Overview: Time Called: 2110 Arrival Time: 2110 Event Type: Cardiac  Initial Focused Assessment:  Upon arrival, Ms. Sarra is lying in the bed resting with her son, alert and oriented x4.  She is very anxious having CP 10/10 with some mild SOB.  She states this pain is different and new radiating to her right chest around to her back.  Afebrile, HR 73 NSR, bp 148/96, RR 18 with sats 100% on RA.  She was placed on 2LNC due to CP.  BBS CTA with NAD.  Skin is pink, warm and dry.  Cap refill WNL. She became emotional and crying.  We have comforted her and helped her to try and relax.  Stat EKG shows NSR with no acute ST elevation.  SL NTG x3 q30mins given for CP.  She states the pain is no longer radiating to her right side and back, but she still feels 9/10 pressure after 3 SL NTG.  Jeannette Corpus NP notified of events.  Orders received for morphine and d-dimer.  Interventions: -Stat EKG -SL NTGx3 q44mins  -O2 2LNC for sats > 92% -Morphine IV 1 mg -D-Dimer  Plan of Care (if not transferred): -Continue to monitor -Notifiy primary svc of event -Notify primary svc and/or RRRN of any further clinical decompenstations  Event Summary: She states her pain/pressure is almost all gone after the morphine.  Pt stayed in room and stabilized  Jeannette Corpus NP nofitifed of events at 2111 and orders received at 2137.  End Call [place]  Madelynn Done

## 2018-09-26 NOTE — Progress Notes (Signed)
S: No new complaints, however her BUN/Cr increased off of IVF's. O:BP 137/87 (BP Location: Left Arm)   Pulse 62   Temp 98.3 F (36.8 C) (Oral)   Resp 16   Wt 70.2 kg   LMP 06/22/2018   SpO2 98%   BMI 24.24 kg/m   Intake/Output Summary (Last 24 hours) at 09/26/2018 1211 Last data filed at 09/25/2018 1855 Gross per 24 hour  Intake 58 ml  Output -  Net 58 ml   Intake/Output: I/O last 3 completed shifts: In: 71 [IV Piggyback:58] Out: -   Intake/Output this shift:  No intake/output data recorded. Weight change: 0 kg Gen: NAD CVS: no rub Resp: cta Abd: [redacted] weeks gestation, mild tenderness to palpation Ext: no edema  Recent Labs  Lab 09/20/18 1323 09/20/18 1441 09/20/18 2043 09/21/18 0251 09/21/18 0853 09/22/18 0357 09/23/18 0347 09/24/18 0445 09/25/18 0420 09/26/18 0426  NA 131* 132* 132* 134*  --  134* 131* 134* 133* 135  K 7.0* 6.1* 5.5* 5.1  --  4.6 4.7 4.2 4.4 4.0  CL 110 110 109 108  --  105 106 101 103 105  CO2 17* 17* 18* 20*  --  23 22 25  21* 20*  GLUCOSE 81 108* 90 99  --  85 85 84 137* 113*  BUN 24* 24* 19 17  --  13 14 15 19  28*  CREATININE 2.44* 2.51* 2.43* 2.38*  --  2.27* 2.37* 2.36* 2.51* 2.70*  ALBUMIN 3.0* 3.0*  --  2.4*  --  2.3*  2.2* 2.3* 2.3* 2.3* 2.5*  CALCIUM 8.1* 8.0* 7.8* 8.2*  --  7.8* 7.7* 7.6* 7.9* 8.3*  PHOS  --   --   --   --  4.8* 5.4* 4.7* 5.1* 4.0 4.5  AST 27 27  --  22  --  18  --   --   --   --   ALT 37 36  --  29  --  24  --   --   --   --    Liver Function Tests: Recent Labs  Lab 09/20/18 1441 09/21/18 0251 09/22/18 0357  09/24/18 0445 09/25/18 0420 09/26/18 0426  AST 27 22 18   --   --   --   --   ALT 36 29 24  --   --   --   --   ALKPHOS 56 53 51  --   --   --   --   BILITOT 0.4 0.4 0.4  --   --   --   --   PROT 7.6 6.3* 6.2*  --   --   --   --   ALBUMIN 3.0* 2.4* 2.3*  2.2*   < > 2.3* 2.3* 2.5*   < > = values in this interval not displayed.   Recent Labs  Lab 09/20/18 1323  LIPASE 28   No results for  input(s): AMMONIA in the last 168 hours. CBC: Recent Labs  Lab 09/20/18 1323 09/21/18 0251 09/22/18 0357 09/23/18 0347 09/24/18 0445  WBC 6.1 4.8 4.3 5.0 5.1  NEUTROABS 4.9  --   --   --   --   HGB 8.0* 7.0* 6.9* 8.9* 8.4*  HCT 22.3* 21.7* 20.3* 26.5* 24.6*  MCV 83.8 87.1 84.6 86.3 85.4  PLT 221 217 211 188 171   Cardiac Enzymes: Recent Labs  Lab 09/20/18 2043 09/21/18 0251 09/21/18 0853  TROPONINI <0.03 <0.03 <0.03   CBG: No results for input(s): GLUCAP in  the last 168 hours.  Iron Studies: No results for input(s): IRON, TIBC, TRANSFERRIN, FERRITIN in the last 72 hours. Studies/Results: No results found. . sodium chloride   Intravenous Once  . azaTHIOprine  50 mg Oral BID  . [START ON 09/27/2018] cyanocobalamin  1,000 mcg Intramuscular Once per day on Mon Thu  . famotidine  20 mg Oral BID  . hydrocortisone cream   Topical TID  . metroNIDAZOLE  500 mg Oral BID  . patiromer  8.4 g Oral Daily  . prenatal multivitamin  1 tablet Oral Q1200    BMET    Component Value Date/Time   NA 135 09/26/2018 0426   K 4.0 09/26/2018 0426   CL 105 09/26/2018 0426   CO2 20 (L) 09/26/2018 0426   GLUCOSE 113 (H) 09/26/2018 0426   BUN 28 (H) 09/26/2018 0426   CREATININE 2.70 (H) 09/26/2018 0426   CALCIUM 8.3 (L) 09/26/2018 0426   GFRNONAA 22 (L) 09/26/2018 0426   GFRAA 25 (L) 09/26/2018 0426   CBC    Component Value Date/Time   WBC 5.1 09/24/2018 0445   RBC 2.88 (L) 09/24/2018 0445   HGB 8.4 (L) 09/24/2018 0445   HCT 24.6 (L) 09/24/2018 0445   PLT 171 09/24/2018 0445   MCV 85.4 09/24/2018 0445   MCH 29.2 09/24/2018 0445   MCHC 34.1 09/24/2018 0445   RDW 11.9 09/24/2018 0445   LYMPHSABS 1.0 09/20/2018 1323   MONOABS 0.2 09/20/2018 1323   EOSABS 0.1 09/20/2018 1323   BASOSABS 0.0 09/20/2018 1323     Assessment/Plan: 1. AKI- in setting of [redacted] weeks gestation and history of Lupus as well as N/anorexia and diarrhea. 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  Hopefully this will respond to alternative agents that are safe for her fetus (Imuran, cyclosporine, and tacrolimus have been shown to be relatively safe during pregnancy).  She is on solumedrol bolus and will reach out to [facility] for recommendations on immunosuppressive agents but I am leaning towards azothioprine as she will not require regular trough levels since she is living in a shelter and has lower nephrotoxiciy compared to cyclosporine and tacrolimus. 1. Started imuran 50mg  bid (not to exceed 2 mg/kg/day) and follow up with me once stable for discharge 2. Cr rising since stopping IVF"s, will resume IVF's and follow 3. Will start prednisone 40mg  daily tomorrow (will receive 3rd dose of solumedrol today) 2. Hyperkalemia- due to AKI.Improvedwithisotonic bicarb and veltassa.  3. SLE- diagnosed in August 2019 and was not on any medications PTA 4. [redacted] weeks gestation- per OB 5. Diarrhea- stool cultures and viral studiesnegative. No recent abx. 6. Anemia- significant drop over the past 2 months. Guaiac stools and follow H/H  7. Disposition- Currently residing in an homeless shelter and will plan for discharge when her renal function stabilizes off of IVF's.  Donetta Potts, MD [employer] [phone]

## 2018-09-26 NOTE — Progress Notes (Signed)
Cathy [name]  NAT:[id number] DOB: [birth year] DOA: 09/20/2018 PCP: Patient, No Pcp Per    Brief Narrative:   37 y.o. F w/ a hx ofbipolar disorder, recent diagnosis of SLE, homelessness, PID/gonorrhea and trichomonas infections, and depression who presented w/ a week of nausea vomiting diarrhea and abdominal pain. She initially presented to [facility] as she is currently [redacted] weeks pregnant.  She did not receive any prenatal care  Her evaluation revealed K+ of 7.3, creatinine 2.51, UA w/ large blood. An US showed normal fetal movement. She was transferred to [facility].  Subjective: Reports her abdominal cramps has significantly subsided, otherwise denies any other complaints assessment & Plan:  Acute renal failure  - concern for acute lupus flare, especially in the setting of blood and the protein in the urine, with low C3 and C4, positive ANA speckled, . 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 -Renal biopsy was done 09/23/2018 by IR, with pathology, to send out, will take few days before it comes back.  Recent Labs  Lab 09/22/18 0357 09/23/18 0347 09/24/18 0445 09/25/18 0420 09/26/18 0426  CREATININE 2.27* 2.37* 2.36* 2.51* 2.70*    Hyperkalemia  Corrected   Hypomagnesemia  Due to GI losses -repleted  Diarrhea  Etiology not clear -resolved  Normocytic Anemia  -hemoglobin dropped to 6.9 after hydration, transfused total of 2 units, with good response. -Vitamin B12 in the low range, will be repleted.  B12 deficiency -Started on IM supplements, folate within normal limits  Recently diagnosed SLE Presented w/ malar rash August 2019  Pregnancy - 16 weeks  - started on prenatal vitamins, did not receive any  prenatal care, -She had some cramps after Lovenox shot, uterus heartbeats by Doppler performed by ED nurse, 141 bpm.  Bacterial vaginitis -OB input greatly appreciated, continue with Diflucan for 1 week  -Chlamydia and gonorrhea is negative  Tobacco abuse --She was counseled, currently on nicotine patch  Bipolar D/O  DVT prophylaxis: SCDs,  on Lovenox Code Status: FULL CODE Family Communication: I have discussed with mother at bedside disposition Plan: home when stable  Procedures: Ultrasound-guided renal biopsy by IR 09/23/2018  Consultants:  Nephrology  IR  Antimicrobials:  none  Objective: Blood pressure 137/87, pulse 62, temperature 98.3 F (36.8 C), temperature source Oral, resp. rate 16, weight 70.2 kg, last menstrual period 06/22/2018, SpO2 98 %.  Intake/Output Summary (Last 24 hours) at 09/26/2018 1336 Last data filed at 09/25/2018 1855 Gross per 24 hour  Intake 58 ml  Output -  Net 58 ml   Filed Weights   09/20/18 1224 09/25/18 0624 09/26/18 0503  Weight: 65.9 kg 70.2 kg 70.2 kg    Examination:  Awake Alert, Oriented X 3, No new F.N deficits, Normal affect Symmetrical Chest wall movement, Good air movement bilaterally, CTAB RRR,No Gallops,Rubs or new Murmurs, No Parasternal Heave +ve B.Sounds, Abd Soft, No tenderness, gravid +, no rebound - guarding or rigidity. No Cyanosis, Clubbing or edema, No new Rash or bruise       CBC: Recent Labs  Lab 09/20/18 1323  09/22/18 0357 09/23/18 0347 09/24/18 0445  WBC 6.1   < > 4.3 5.0 5.1  NEUTROABS 4.9  --   --   --   --  HGB 8.0*   < > 6.9* 8.9* 8.4*  HCT 22.3*   < > 20.3* 26.5* 24.6*  MCV 83.8   < > 84.6 86.3 85.4  PLT 221   < > 211 188 171   < > = values in this interval not displayed.   Basic Metabolic Panel: Recent Labs  Lab 09/21/18 0853  09/24/18 0445 09/25/18 0420 09/26/18 0426  NA  --    < > 134* 133* 135  K  --    < > 4.2 4.4 4.0  CL  --    < > 101 103 105  CO2  --    < > 25 21* 20*    GLUCOSE  --    < > 84 137* 113*  BUN  --    < > 15 19 28*  CREATININE  --    < > 2.36* 2.51* 2.70*  CALCIUM  --    < > 7.6* 7.9* 8.3*  MG 1.5*  --   --   --   --   PHOS 4.8*   < > 5.1* 4.0 4.5   < > = values in this interval not displayed.   GFR: Estimated Creatinine Clearance: 28.6 mL/min (A) (by C-G formula based on SCr of 2.7 mg/dL (H)).  Liver Function Tests: Recent Labs  Lab 09/20/18 1323 09/20/18 1441 09/21/18 0251 09/22/18 0357 09/23/18 0347 09/24/18 0445 09/25/18 0420 09/26/18 0426  AST 27 27 22 18   --   --   --   --   ALT 37 36 29 24  --   --   --   --   ALKPHOS 57 56 53 51  --   --   --   --   BILITOT 0.4 0.4 0.4 0.4  --   --   --   --   PROT 7.5 7.6 6.3* 6.2*  --   --   --   --   ALBUMIN 3.0* 3.0* 2.4* 2.3*  2.2* 2.3* 2.3* 2.3* 2.5*   Recent Labs  Lab 09/20/18 1323  LIPASE 28    Cardiac Enzymes: Recent Labs  Lab 09/20/18 2043 09/21/18 0251 09/21/18 0853  TROPONINI <0.03 <0.03 <0.03     Recent Results (from the past 240 hour(s))  Culture, OB Urine     Status: None   Collection Time: 09/20/18 12:38 PM  Result Value Ref Range Status   Specimen Description   Final    URINE, CLEAN CATCH Performed at [facility], [address], [place], [place] [address]    Special Requests   Final    NONE Performed at [facility], [address], [place], [place] [address]    Culture   Final    NO GROWTH NO GROUP B STREP (S.AGALACTIAE) ISOLATED Performed at [facility], [place] [address] [place], [place] [address]    Report Status 09/21/2018 FINAL  Final  Gastrointestinal Panel by PCR , Stool     Status: None   Collection Time: 09/21/18  2:11 AM  Result Value Ref Range Status   Campylobacter species NOT DETECTED NOT DETECTED Final   Plesimonas shigelloides NOT DETECTED NOT DETECTED Final   Salmonella species NOT DETECTED NOT DETECTED Final   Yersinia enterocolitica NOT DETECTED NOT DETECTED Final   Vibrio species NOT DETECTED NOT DETECTED  Final   Vibrio cholerae NOT DETECTED NOT DETECTED Final   Enteroaggregative E coli (EAEC) NOT DETECTED NOT DETECTED Final   Enteropathogenic E coli (EPEC) NOT DETECTED  NOT DETECTED Final   Enterotoxigenic E coli (ETEC) NOT DETECTED NOT DETECTED Final   Shiga like toxin producing E coli (STEC) NOT DETECTED NOT DETECTED Final   Shigella/Enteroinvasive E coli (EIEC) NOT DETECTED NOT DETECTED Final   Cryptosporidium NOT DETECTED NOT DETECTED Final   Cyclospora cayetanensis NOT DETECTED NOT DETECTED Final   Entamoeba histolytica NOT DETECTED NOT DETECTED Final   Giardia lamblia NOT DETECTED NOT DETECTED Final   Adenovirus F40/41 NOT DETECTED NOT DETECTED Final   Astrovirus NOT DETECTED NOT DETECTED Final   Norovirus GI/GII NOT DETECTED NOT DETECTED Final   Rotavirus A NOT DETECTED NOT DETECTED Final   Sapovirus (I, II, IV, and V) NOT DETECTED NOT DETECTED Final    Comment: Performed at [facility], [place] [address] [place], [place] [address]  Respiratory Panel by PCR     Status: None   Collection Time: 09/21/18  5:07 AM  Result Value Ref Range Status   Adenovirus NOT DETECTED NOT DETECTED Final   Coronavirus 229E NOT DETECTED NOT DETECTED Final   Coronavirus HKU1 NOT DETECTED NOT DETECTED Final   Coronavirus NL63 NOT DETECTED NOT DETECTED Final   Coronavirus OC43 NOT DETECTED NOT DETECTED Final   Metapneumovirus NOT DETECTED NOT DETECTED Final   Rhinovirus / Enterovirus NOT DETECTED NOT DETECTED Final   Influenza A NOT DETECTED NOT DETECTED Final   Influenza B NOT DETECTED NOT DETECTED Final   Parainfluenza Virus 1 NOT DETECTED NOT DETECTED Final   Parainfluenza Virus 2 NOT DETECTED NOT DETECTED Final   Parainfluenza Virus 3 NOT DETECTED NOT DETECTED Final   Parainfluenza Virus 4 NOT DETECTED NOT DETECTED Final   Respiratory Syncytial Virus NOT DETECTED NOT DETECTED Final   Bordetella pertussis NOT DETECTED NOT DETECTED Final   Chlamydophila pneumoniae NOT DETECTED NOT  DETECTED Final   Mycoplasma pneumoniae NOT DETECTED NOT DETECTED Final    Comment: Performed at [facility], [place] [address] [place], [place] [address]  MRSA PCR Screening     Status: None   Collection Time: 09/21/18  9:44 AM  Result Value Ref Range Status   MRSA by PCR NEGATIVE NEGATIVE Final    Comment:        The GeneXpert MRSA Assay (FDA approved for NASAL specimens only), is one component of a comprehensive MRSA colonization surveillance program. It is not intended to diagnose MRSA infection nor to guide or monitor treatment for MRSA infections. Performed at [facility], [place] [address] [place], [place] [address]   Urine Culture     Status: Abnormal   Collection Time: 09/21/18  6:20 PM  Result Value Ref Range Status   Specimen Description URINE, RANDOM  Final   Special Requests NONE  Final   Culture (A)  Final    <10,000 COLONIES/mL INSIGNIFICANT GROWTH Performed at [facility], [place] [address] [place], [place] [address]    Report Status 09/22/2018 FINAL  Final  Wet prep, genital     Status: Abnormal   Collection Time: 09/23/18  5:09 PM  Result Value Ref Range Status   Yeast Wet Prep HPF POC NONE SEEN NONE SEEN Final    Comment: Specimen diluted due to transport tube containing more than 1 ml of saline, interpret results with caution.   Trich, Wet Prep NONE SEEN NONE SEEN Final   Clue Cells Wet Prep HPF POC PRESENT (A) NONE SEEN Final   WBC, Wet Prep HPF POC FEW (A) NONE SEEN Final   Sperm  NONE SEEN  Final    Comment: Performed at [facility], [place] [address], [place], [place] [address]     Scheduled Meds: . sodium chloride   Intravenous Once  . azaTHIOprine  50 mg Oral BID  . [START ON 09/27/2018] cyanocobalamin  1,000 mcg Intramuscular Once per day on Mon Thu  . famotidine  20 mg Oral BID  . hydrocortisone cream   Topical TID  . metroNIDAZOLE  500 mg Oral BID  . patiromer  8.4 g Oral Daily  . prenatal multivitamin  1 tablet Oral Q1200    Continuous Infusions: . sodium chloride 50 mL/hr at 09/26/18 1113     LOS: 6 days   Phillips Climes, MD [facility] Office  [phone] Pager - Text Page per Shea Evans  If 7PM-7AM, please contact night-coverage per Amion 09/26/2018, 1:36 PM

## 2018-09-27 ENCOUNTER — Inpatient Hospital Stay (HOSPITAL_COMMUNITY): Payer: Medicaid Other

## 2018-09-27 DIAGNOSIS — R7989 Other specified abnormal findings of blood chemistry: Secondary | ICD-10-CM

## 2018-09-27 LAB — RENAL FUNCTION PANEL
ANION GAP: 7 (ref 5–15)
Albumin: 2.5 g/dL — ABNORMAL LOW (ref 3.5–5.0)
BUN: 29 mg/dL — ABNORMAL HIGH (ref 6–20)
CHLORIDE: 110 mmol/L (ref 98–111)
CO2: 17 mmol/L — ABNORMAL LOW (ref 22–32)
Calcium: 7.8 mg/dL — ABNORMAL LOW (ref 8.9–10.3)
Creatinine, Ser: 2.54 mg/dL — ABNORMAL HIGH (ref 0.44–1.00)
GFR calc Af Amer: 27 mL/min — ABNORMAL LOW (ref >60)
GFR calc non Af Amer: 24 mL/min — ABNORMAL LOW (ref >60)
Glucose, Bld: 138 mg/dL — ABNORMAL HIGH (ref 70–99)
PHOSPHORUS: 3.3 mg/dL (ref 2.5–4.6)
POTASSIUM: 3.8 mmol/L (ref 3.5–5.1)
Sodium: 134 mmol/L — ABNORMAL LOW (ref 135–145)

## 2018-09-27 LAB — D-DIMER, QUANTITATIVE: D-Dimer, Quant: 2.03 ug/mL-FEU — ABNORMAL HIGH (ref 0.00–0.50)

## 2018-09-27 LAB — TROPONIN I: Troponin I: <0.03 ng/mL (ref <0.03)

## 2018-09-27 MED ORDER — SIMETHICONE 80 MG PO CHEW
160.0000 mg | CHEWABLE_TABLET | Freq: Four times a day (QID) | ORAL | Status: DC | PRN
  Administered 2018-09-27: 160 mg via ORAL
  Filled 2018-09-27: qty 2

## 2018-09-27 MED ORDER — ENOXAPARIN SODIUM 30 MG/0.3ML ~~LOC~~ SOLN
30.0000 mg | SUBCUTANEOUS | Status: DC
  Administered 2018-09-27: 30 mg via SUBCUTANEOUS
  Filled 2018-09-27 (×2): qty 0.3

## 2018-09-27 MED ORDER — PREDNISONE 20 MG PO TABS
40.0000 mg | ORAL_TABLET | Freq: Every day | ORAL | Status: DC
  Administered 2018-09-27 – 2018-09-29 (×3): 40 mg via ORAL
  Filled 2018-09-27 (×3): qty 2

## 2018-09-27 MED ORDER — SODIUM BICARBONATE 650 MG PO TABS
650.0000 mg | ORAL_TABLET | Freq: Two times a day (BID) | ORAL | Status: DC
  Administered 2018-09-27 – 2018-09-29 (×5): 650 mg via ORAL
  Filled 2018-09-27 (×5): qty 1

## 2018-09-27 NOTE — Progress Notes (Signed)
Cathy [name]  NWG:[id number] DOB: 16-Apr-1981 DOA: 09/20/2018 PCP: Patient, No Pcp Per    Brief Narrative:   37 y.o. F w/ a hx ofbipolar disorder, recent diagnosis of SLE, homelessness, PID/gonorrhea and trichomonas infections, and depression who presented w/ a week of nausea vomiting diarrhea and abdominal pain. She initially presented to [facility] as she is currently [redacted] weeks pregnant.  She did not receive any prenatal care  Her evaluation revealed K+ of 7.3, creatinine 2.51, UA w/ large blood. An US showed normal fetal movement. She was transferred to [facility].  Subjective: Further abdominal pain, she reports an episode of chest pain overnight  assessment & Plan:  Acute renal failure  - concern for acute lupus flare, especially in the setting of blood and the protein in the urine, with low C3 and C4, positive ANA speckled, . 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 -Renal biopsy was done 09/23/2018 by IR, with pathology, to send out, will take few days before it comes back.  Recent Labs  Lab 09/23/18 0347 09/24/18 0445 09/25/18 0420 09/26/18 0426 09/27/18 0006  CREATININE 2.37* 2.36* 2.51* 2.70* 2.54*    Chest pain -Patient has other complaints of chest pain overnight, but further questioning, she reports she has similar episode every night for last 3 nights, appears to be more likely related to heartburn from her description, EKG was nonacute, troponins were negative, D-dimers was checked overnight which was elevated, most likely in the setting of her pregnancy, have obtain venous Doppler which was negative for DVT, not hypoxic, tachypneic, or tachycardic, discussed with her risks and benefits, at this point plan is not to proceed  with either CTA chest PE protocol or VQ scan.  Hyperkalemia  Corrected   Hypomagnesemia  Due to GI losses -repleted  Diarrhea  Etiology not clear -resolved  Normocytic Anemia  -hemoglobin dropped to 6.9 after hydration, transfused total of 2 units, with good response. -Vitamin B12 in the low range, will be repleted.  B12 deficiency -Started on IM supplements, folate within normal limits  Recently diagnosed SLE Presented w/ malar rash August 2019  Pregnancy - 16 weeks  - started on prenatal vitamins, did not receive any prenatal care, -She had some cramps after Lovenox shot, uterus heartbeats by Doppler performed by ED nurse, 141 bpm.  Bacterial vaginitis -OB input greatly appreciated, continue with Diflucan for 1 week  -Chlamydia and gonorrhea is negative  Tobacco abuse --She was counseled, currently on nicotine patch  Bipolar D/O  DVT prophylaxis: SCDs,  on Lovenox Code Status: FULL CODE Family Communication: I have discussed with cousin at bedside disposition Plan: home when stable  Procedures: Ultrasound-guided renal biopsy by IR 09/23/2018  Consultants:  Nephrology  IR  Antimicrobials:  none  Objective: Blood pressure 132/86, pulse 64, temperature 98.4 F (36.9 C), temperature source Oral, resp. rate 19, weight 70.2 kg, last menstrual period 06/22/2018, SpO2 100 %.  Intake/Output Summary (Last 24 hours) at 09/27/2018 1301 Last data filed at 09/26/2018 1859 Gross per 24 hour  Intake 388.33 ml  Output -  Net 388.33 ml   Filed Weights   09/20/18 1224 09/25/18 0624 09/26/18 0503  Weight: 65.9 kg 70.2 kg 70.2 kg    Examination:  Awake Alert, Oriented X 3, No  new F.N deficits, Normal affect Symmetrical Chest wall movement, Good air movement bilaterally, CTAB RRR,No Gallops,Rubs or new Murmurs, No Parasternal Heave +ve B.Sounds, Abd Soft,gravid +.No rebound - guarding or rigidity. No Cyanosis, Clubbing or edema, No new Rash or bruise         CBC: Recent Labs  Lab 09/20/18 1323  09/22/18 0357 09/23/18 0347 09/24/18 0445  WBC 6.1   < > 4.3 5.0 5.1  NEUTROABS 4.9  --   --   --   --   HGB 8.0*   < > 6.9* 8.9* 8.4*  HCT 22.3*   < > 20.3* 26.5* 24.6*  MCV 83.8   < > 84.6 86.3 85.4  PLT 221   < > 211 188 171   < > = values in this interval not displayed.   Basic Metabolic Panel: Recent Labs  Lab 09/21/18 0853  09/25/18 0420 09/26/18 0426 09/27/18 0006  NA  --    < > 133* 135 134*  K  --    < > 4.4 4.0 3.8  CL  --    < > 103 105 110  CO2  --    < > 21* 20* 17*  GLUCOSE  --    < > 137* 113* 138*  BUN  --    < > 19 28* 29*  CREATININE  --    < > 2.51* 2.70* 2.54*  CALCIUM  --    < > 7.9* 8.3* 7.8*  MG 1.5*  --   --   --   --   PHOS 4.8*   < > 4.0 4.5 3.3   < > = values in this interval not displayed.   GFR: Estimated Creatinine Clearance: 30.3 mL/min (A) (by C-G formula based on SCr of 2.54 mg/dL (H)).  Liver Function Tests: Recent Labs  Lab 09/20/18 1323 09/20/18 1441 09/21/18 0251 09/22/18 0357  09/24/18 0445 09/25/18 0420 09/26/18 0426 09/27/18 0006  AST 27 27 22 18   --   --   --   --   --   ALT 37 36 29 24  --   --   --   --   --   ALKPHOS 57 56 53 51  --   --   --   --   --   BILITOT 0.4 0.4 0.4 0.4  --   --   --   --   --   PROT 7.5 7.6 6.3* 6.2*  --   --   --   --   --   ALBUMIN 3.0* 3.0* 2.4* 2.3*  2.2*   < > 2.3* 2.3* 2.5* 2.5*   < > = values in this interval not displayed.   Recent Labs  Lab 09/20/18 1323  LIPASE 28    Cardiac Enzymes: Recent Labs  Lab 09/20/18 2043 09/21/18 0251 09/21/18 0853 09/26/18 2325  TROPONINI <0.03 <0.03 <0.03 <0.03     Recent Results (from the past 240 hour(s))  Culture, OB Urine     Status: None   Collection Time: 09/20/18 12:38 PM  Result Value Ref Range Status   Specimen Description   Final    URINE, CLEAN CATCH Performed at [facility], [address] [place], [place] [address]    Special Requests   Final    NONE Performed  at [facility], [address], [place], [place] [address]    Culture   Final    NO GROWTH NO GROUP B STREP (S.AGALACTIAE) ISOLATED  Performed at [facility], [place] [address] [place], [place] [address]    Report Status 09/21/2018 FINAL  Final  Gastrointestinal Panel by PCR , Stool     Status: None   Collection Time: 09/21/18  2:11 AM  Result Value Ref Range Status   Campylobacter species NOT DETECTED NOT DETECTED Final   Plesimonas shigelloides NOT DETECTED NOT DETECTED Final   Salmonella species NOT DETECTED NOT DETECTED Final   Yersinia enterocolitica NOT DETECTED NOT DETECTED Final   Vibrio species NOT DETECTED NOT DETECTED Final   Vibrio cholerae NOT DETECTED NOT DETECTED Final   Enteroaggregative E coli (EAEC) NOT DETECTED NOT DETECTED Final   Enteropathogenic E coli (EPEC) NOT DETECTED NOT DETECTED Final   Enterotoxigenic E coli (ETEC) NOT DETECTED NOT DETECTED Final   Shiga like toxin producing E coli (STEC) NOT DETECTED NOT DETECTED Final   Shigella/Enteroinvasive E coli (EIEC) NOT DETECTED NOT DETECTED Final   Cryptosporidium NOT DETECTED NOT DETECTED Final   Cyclospora cayetanensis NOT DETECTED NOT DETECTED Final   Entamoeba histolytica NOT DETECTED NOT DETECTED Final   Giardia lamblia NOT DETECTED NOT DETECTED Final   Adenovirus F40/41 NOT DETECTED NOT DETECTED Final   Astrovirus NOT DETECTED NOT DETECTED Final   Norovirus GI/GII NOT DETECTED NOT DETECTED Final   Rotavirus A NOT DETECTED NOT DETECTED Final   Sapovirus (I, II, IV, and V) NOT DETECTED NOT DETECTED Final    Comment: Performed at [facility], [place] [place], [place] [address]  Respiratory Panel by PCR     Status: None   Collection Time: 09/21/18  5:07 AM  Result Value Ref Range Status   Adenovirus NOT DETECTED NOT DETECTED Final   Coronavirus 229E NOT DETECTED NOT DETECTED Final   Coronavirus HKU1 NOT DETECTED NOT DETECTED Final   Coronavirus NL63 NOT DETECTED NOT DETECTED  Final   Coronavirus OC43 NOT DETECTED NOT DETECTED Final   Metapneumovirus NOT DETECTED NOT DETECTED Final   Rhinovirus / Enterovirus NOT DETECTED NOT DETECTED Final   Influenza A NOT DETECTED NOT DETECTED Final   Influenza B NOT DETECTED NOT DETECTED Final   Parainfluenza Virus 1 NOT DETECTED NOT DETECTED Final   Parainfluenza Virus 2 NOT DETECTED NOT DETECTED Final   Parainfluenza Virus 3 NOT DETECTED NOT DETECTED Final   Parainfluenza Virus 4 NOT DETECTED NOT DETECTED Final   Respiratory Syncytial Virus NOT DETECTED NOT DETECTED Final   Bordetella pertussis NOT DETECTED NOT DETECTED Final   Chlamydophila pneumoniae NOT DETECTED NOT DETECTED Final   Mycoplasma pneumoniae NOT DETECTED NOT DETECTED Final    Comment: Performed at [facility], [place]. [address] [place], [place] [address]  MRSA PCR Screening     Status: None   Collection Time: 09/21/18  9:44 AM  Result Value Ref Range Status   MRSA by PCR NEGATIVE NEGATIVE Final    Comment:        The GeneXpert MRSA Assay (FDA approved for NASAL specimens only), is one component of a comprehensive MRSA colonization surveillance program. It is not intended to diagnose MRSA infection nor to guide or monitor treatment for MRSA infections. Performed at [facility], [place] [address] [place], [place] [address]   Urine Culture     Status: Abnormal   Collection Time: 09/21/18  6:20 PM  Result Value Ref Range Status   Specimen Description URINE, RANDOM  Final   Special Requests NONE  Final   Culture (A)  Final    <  10,000 COLONIES/mL INSIGNIFICANT GROWTH Performed at [facility], [place] [address] [place], [place] [address]    Report Status 09/22/2018 FINAL  Final  Wet prep, genital     Status: Abnormal   Collection Time: 09/23/18  5:09 PM  Result Value Ref Range Status   Yeast Wet Prep HPF POC NONE SEEN NONE SEEN Final    Comment: Specimen diluted due to transport tube containing more than 1 ml of saline, interpret  results with caution.   Trich, Wet Prep NONE SEEN NONE SEEN Final   Clue Cells Wet Prep HPF POC PRESENT (A) NONE SEEN Final   WBC, Wet Prep HPF POC FEW (A) NONE SEEN Final   Sperm NONE SEEN  Final    Comment: Performed at [facility], [place] [address], [place], [place] [address]     Scheduled Meds: . sodium chloride   Intravenous Once  . azaTHIOprine  50 mg Oral BID  . cyanocobalamin  1,000 mcg Intramuscular Once per day on Mon Thu  . famotidine  20 mg Oral BID  . hydrocortisone cream   Topical TID  . metroNIDAZOLE  500 mg Oral BID  . patiromer  8.4 g Oral Daily  . prenatal multivitamin  1 tablet Oral Q1200   Continuous Infusions: . sodium chloride 50 mL/hr at 09/27/18 0542     LOS: 7 days   Phillips Climes, MD [facility] Office  [phone] Pager - Text Page per Shea Evans  If 7PM-7AM, please contact night-coverage per Amion 09/27/2018, 1:01 PM

## 2018-09-27 NOTE — Progress Notes (Signed)
[facility] Progress Note    Assessment/ Plan:   1. AKI- in setting of [redacted] weeks gestation and history of Lupus as well as N/anorexia and diarrhea. 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 She is on solumedrol bolus and will reach out to [facility] for recommendations on immunosuppressive agents but I am leaning towards azothioprine as she will not require regular trough levels since she is living in a shelter and has lower nephrotoxiciy compared to cyclosporine and tacrolimus. 1. Started imuran 50mg  bid (not to exceed 2 mg/kg/day) and follow up with Dr. Marval Regal once stable for discharge 2. Fortunately cr has possibly peaked and now lower today; hopefully this represents the start of a trend. 3. Started prednisone 40mg  daily today (10/28)  4. I also counseled her that if renal function worsens she may have to make the difficult decision of aborting and then receiving definitive treatment (cellcept or cytoxan). Fortunately we did not have to make that decision today. 5. Start NaHco3 PO BID 2. Hyperkalemia- due to AKI.Improvedwithisotonic bicarb and veltassa.  - Stop the veltassa and will follow closely. 3. SLE- diagnosed in August 2019and wasnot on any medicationsPTA 4. [redacted] weeks gestation- per OB 5. Diarrhea- stool cultures and viral studiesnegative. No recent abx. 6. Anemia- significant drop over the past 2 months. Guaiac stools and follow H/H  7. Disposition- Currently residing in an homeless shelterand will plan for discharge when her renal function stabilizes off of IVF's.  Subjective:   Denies f/c/n/v.  No events overnight.   Objective:   BP 134/79 (BP Location: Right Arm)   Pulse 64   Temp 98.4 F (36.9 C)  (Oral)   Resp 18   Wt 70.2 kg   LMP 06/22/2018   SpO2 100%   BMI 24.24 kg/m   Intake/Output Summary (Last 24 hours) at 09/27/2018 1503 Last data filed at 09/26/2018 1859 Gross per 24 hour  Intake 388.33 ml  Output -  Net 388.33 ml   Weight change:   Physical Exam: Gen: NAD CVS: no rub Resp: cta Abd: [redacted] weeks gestation, mild tenderness to palpation Ext: no edema  Imaging: No results found.  Labs: BMET Recent Labs  Lab 09/21/18 0251 09/21/18 2725 09/22/18 0357 09/23/18 0347 09/24/18 0445 09/25/18 0420 09/26/18 0426 09/27/18 0006  NA 134*  --  134* 131* 134* 133* 135 134*  K 5.1  --  4.6 4.7 4.2 4.4 4.0 3.8  CL 108  --  105 106 101 103 105 110  CO2 20*  --  23 22 25  21* 20* 17*  GLUCOSE 99  --  85 85 84 137* 113* 138*  BUN 17  --  13 14 15 19  28* 29*  CREATININE 2.38*  --  2.27* 2.37* 2.36* 2.51* 2.70* 2.54*  CALCIUM 8.2*  --  7.8* 7.7* 7.6* 7.9* 8.3* 7.8*  PHOS  --  4.8* 5.4* 4.7* 5.1* 4.0 4.5 3.3   CBC Recent Labs  Lab 09/21/18 0251 09/22/18 0357 09/23/18 0347 09/24/18 0445  WBC 4.8 4.3 5.0 5.1  HGB 7.0* 6.9* 8.9* 8.4*  HCT 21.7* 20.3* 26.5* 24.6*  MCV 87.1 84.6 86.3 85.4  PLT 217 211 188 171    Medications:    . sodium chloride   Intravenous Once  . azaTHIOprine  50 mg Oral BID  .  cyanocobalamin  1,000 mcg Intramuscular Once per day on Mon Thu  . enoxaparin (LOVENOX) injection  30 mg Subcutaneous Q24H  . famotidine  20 mg Oral BID  . hydrocortisone cream   Topical TID  . metroNIDAZOLE  500 mg Oral BID  . patiromer  8.4 g Oral Daily  . predniSONE  40 mg Oral Q breakfast  . prenatal multivitamin  1 tablet Oral Q1200      Otelia Santee, MD 09/27/2018, 3:03 PM

## 2018-09-27 NOTE — Progress Notes (Signed)
Bilateral lower extremity venous duplex has been completed. Negative for DVT. Results were given to the patient's nurse, [place].  09/27/18 10:13 AM Carlos Levering RVT

## 2018-09-28 DIAGNOSIS — D72829 Elevated white blood cell count, unspecified: Secondary | ICD-10-CM

## 2018-09-28 LAB — BASIC METABOLIC PANEL
Anion gap: 5 (ref 5–15)
BUN: 28 mg/dL — AB (ref 6–20)
CO2: 19 mmol/L — ABNORMAL LOW (ref 22–32)
Calcium: 7.6 mg/dL — ABNORMAL LOW (ref 8.9–10.3)
Chloride: 112 mmol/L — ABNORMAL HIGH (ref 98–111)
Creatinine, Ser: 2 mg/dL — ABNORMAL HIGH (ref 0.44–1.00)
GFR calc non Af Amer: 31 mL/min — ABNORMAL LOW (ref >60)
GFR, EST AFRICAN AMERICAN: 36 mL/min — AB (ref >60)
Glucose, Bld: 105 mg/dL — ABNORMAL HIGH (ref 70–99)
POTASSIUM: 4.3 mmol/L (ref 3.5–5.1)
SODIUM: 136 mmol/L (ref 135–145)

## 2018-09-28 LAB — PHOSPHORUS: PHOSPHORUS: 3.5 mg/dL (ref 2.5–4.6)

## 2018-09-28 LAB — CBC
HCT: 24.6 % — ABNORMAL LOW (ref 36.0–46.0)
HEMOGLOBIN: 8 g/dL — AB (ref 12.0–15.0)
MCH: 29.1 pg (ref 26.0–34.0)
MCHC: 32.5 g/dL (ref 30.0–36.0)
MCV: 89.5 fL (ref 80.0–100.0)
PLATELETS: 227 10*3/uL (ref 150–400)
RBC: 2.75 MIL/uL — AB (ref 3.87–5.11)
RDW: 12.8 % (ref 11.5–15.5)
WBC: 12 10*3/uL — ABNORMAL HIGH (ref 4.0–10.5)
nRBC: 0 % (ref 0.0–0.2)

## 2018-09-28 LAB — ALBUMIN: Albumin: 2.4 g/dL — ABNORMAL LOW (ref 3.5–5.0)

## 2018-09-28 NOTE — Progress Notes (Addendum)
Cathy [name]  FTD:[id number] DOB: 07-Jan-1981 DOA: 09/20/2018 PCP: Patient, No Pcp Per    Brief Narrative:   37 y.o. F w/ a hx ofbipolar disorder, recent diagnosis of SLE, homelessness, PID/gonorrhea and trichomonas infections, and depression who presented w/ a week of nausea vomiting diarrhea and abdominal pain. She initially presented to [facility] as she is currently [redacted] weeks pregnant.  She did not receive any prenatal care  Her evaluation revealed K+ of 7.3, creatinine 2.51, UA w/ large blood. An US showed normal fetal movement. She was transferred to [facility]  Subjective: No significant events overnight, she denies any complaints today  assessment & Plan:  Acute renal failure  - concern for acute lupus flare, especially in the setting of blood and the protein in the urine, with low C3 and C4, positive ANA speckled, . 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 -Was started on sodium bicarb, renal function appears to be improving on IV fluids -Renal biopsy was done 09/23/2018 by IR.  Recent Labs  Lab 09/24/18 0445 09/25/18 0420 09/26/18 0426 09/27/18 0006 09/28/18 0520  CREATININE 2.36* 2.51* 2.70* 2.54* 2.00*    Chest pain -Patient has other complaints of chest pain overnight, but further questioning, she reports she has similar episode every night for last 3 nights, appears to be more likely related to heartburn from her description, EKG was nonacute, troponins were negative, D-dimers was checked overnight which was elevated, most likely in the setting of her pregnancy, have obtain venous Doppler which was negative for DVT, not hypoxic, tachypneic, or tachycardic, discussed with her risks and benefits, at this point plan is not to  proceed with either CTA chest PE protocol or VQ scan.  Leukocytosis -Most likely in the setting of steroids, white cell count went up to 12 today, will monitor closely repeat CBC in a.m., if continues to increase will obtain UA  Hyperkalemia  Corrected   Hypomagnesemia  Due to GI losses -repleted  Diarrhea  Etiology not clear -resolved  Normocytic Anemia  -hemoglobin dropped to 6.9 after hydration, transfused total of 2 units, with good response. -Vitamin B12 in the low range, will be repleted.  B12 deficiency -Started on IM supplements, folate within normal limits  Recently diagnosed SLE Presented w/ malar rash August 2019  Pregnancy - 16 weeks  - started on prenatal vitamins, did not receive any prenatal care,  Bacterial vaginitis -OB input greatly appreciated, continue with fluconazole for 1 week  -Chlamydia and gonorrhea is negative  Tobacco abuse --She was counseled, currently on nicotine patch  Bipolar D/O  DVT prophylaxis: SCDs,  on Lovenox Code Status: FULL CODE Family Communication: I have discussed with cousin at bedside disposition Plan: home when stable  Procedures: Ultrasound-guided renal biopsy by IR 09/23/2018  Consultants:  Nephrology  IR  Antimicrobials:  none  Objective: Blood pressure (!) 157/98, pulse (!) 56, temperature 98.6 F (37 C), resp. rate 18, weight 74.8 kg, last menstrual period 06/22/2018, SpO2 98 %.  Intake/Output Summary (Last 24 hours) at 09/28/2018 1456 Last data filed at 09/27/2018 1800 Gross per 24 hour  Intake 1390.26 ml  Output -  Net 1390.26 ml   Filed Weights   09/25/18 0624 09/26/18 0503 09/27/18 1130  Weight: 70.2 kg 70.2 kg 74.8  kg    Examination:  Awake Alert, Oriented X 3, No new F.N deficits, Normal affect Symmetrical Chest wall movement, Good air movement bilaterally, CTAB RRR,No Gallops,Rubs or new Murmurs, No Parasternal Heave +ve B.Sounds, Abd Soft, gravid +, No rebound - guarding or rigidity. No  Cyanosis, Clubbing or edema, No new Rash or bruise        CBC: Recent Labs  Lab 09/23/18 0347 09/24/18 0445 09/28/18 0520  WBC 5.0 5.1 12.0*  HGB 8.9* 8.4* 8.0*  HCT 26.5* 24.6* 24.6*  MCV 86.3 85.4 89.5  PLT 188 171 157   Basic Metabolic Panel: Recent Labs  Lab 09/26/18 0426 09/27/18 0006 09/28/18 0520  NA 135 134* 136  K 4.0 3.8 4.3  CL 105 110 112*  CO2 20* 17* 19*  GLUCOSE 113* 138* 105*  BUN 28* 29* 28*  CREATININE 2.70* 2.54* 2.00*  CALCIUM 8.3* 7.8* 7.6*  PHOS 4.5 3.3 3.5   GFR: Estimated Creatinine Clearance: 41.9 mL/min (A) (by C-G formula based on SCr of 2 mg/dL (H)).  Liver Function Tests: Recent Labs  Lab 09/22/18 0357  09/25/18 0420 09/26/18 0426 09/27/18 0006 09/28/18 0520  AST 18  --   --   --   --   --   ALT 24  --   --   --   --   --   ALKPHOS 51  --   --   --   --   --   BILITOT 0.4  --   --   --   --   --   PROT 6.2*  --   --   --   --   --   ALBUMIN 2.3*  2.2*   < > 2.3* 2.5* 2.5* 2.4*   < > = values in this interval not displayed.   No results for input(s): LIPASE, AMYLASE in the last 168 hours.  Cardiac Enzymes: Recent Labs  Lab 09/26/18 2325  TROPONINI <0.03     Recent Results (from the past 240 hour(s))  Culture, OB Urine     Status: None   Collection Time: 09/20/18 12:38 PM  Result Value Ref Range Status   Specimen Description   Final    URINE, CLEAN CATCH Performed at [facility], [address], [place], [place] [address]    Special Requests   Final    NONE Performed at [facility], [address], [place], [place] [address]    Culture   Final    NO GROWTH NO GROUP B STREP (S.AGALACTIAE) ISOLATED Performed at [facility], [place] [address] [place], [place] [address]    Report Status 09/21/2018 FINAL  Final  Gastrointestinal Panel by PCR , Stool     Status: None   Collection Time: 09/21/18  2:11 AM  Result Value Ref Range Status   Campylobacter species NOT DETECTED NOT DETECTED Final    Plesimonas shigelloides NOT DETECTED NOT DETECTED Final   Salmonella species NOT DETECTED NOT DETECTED Final   Yersinia enterocolitica NOT DETECTED NOT DETECTED Final   Vibrio species NOT DETECTED NOT DETECTED Final   Vibrio cholerae NOT DETECTED NOT DETECTED Final   Enteroaggregative E coli (EAEC) NOT DETECTED NOT DETECTED Final   Enteropathogenic E coli (EPEC) NOT DETECTED NOT DETECTED Final   Enterotoxigenic E coli (ETEC) NOT DETECTED NOT DETECTED Final   Shiga like toxin producing E coli (STEC) NOT DETECTED NOT DETECTED Final   Shigella/Enteroinvasive E coli (EIEC) NOT DETECTED NOT DETECTED Final  Cryptosporidium NOT DETECTED NOT DETECTED Final   Cyclospora cayetanensis NOT DETECTED NOT DETECTED Final   Entamoeba histolytica NOT DETECTED NOT DETECTED Final   Giardia lamblia NOT DETECTED NOT DETECTED Final   Adenovirus F40/41 NOT DETECTED NOT DETECTED Final   Astrovirus NOT DETECTED NOT DETECTED Final   Norovirus GI/GII NOT DETECTED NOT DETECTED Final   Rotavirus A NOT DETECTED NOT DETECTED Final   Sapovirus (I, II, IV, and V) NOT DETECTED NOT DETECTED Final    Comment: Performed at [facility], [address] [place], [place] [address]  Respiratory Panel by PCR     Status: None   Collection Time: 09/21/18  5:07 AM  Result Value Ref Range Status   Adenovirus NOT DETECTED NOT DETECTED Final   Coronavirus 229E NOT DETECTED NOT DETECTED Final   Coronavirus HKU1 NOT DETECTED NOT DETECTED Final   Coronavirus NL63 NOT DETECTED NOT DETECTED Final   Coronavirus OC43 NOT DETECTED NOT DETECTED Final   Metapneumovirus NOT DETECTED NOT DETECTED Final   Rhinovirus / Enterovirus NOT DETECTED NOT DETECTED Final   Influenza A NOT DETECTED NOT DETECTED Final   Influenza B NOT DETECTED NOT DETECTED Final   Parainfluenza Virus 1 NOT DETECTED NOT DETECTED Final   Parainfluenza Virus 2 NOT DETECTED NOT DETECTED Final   Parainfluenza Virus 3 NOT DETECTED NOT DETECTED Final    Parainfluenza Virus 4 NOT DETECTED NOT DETECTED Final   Respiratory Syncytial Virus NOT DETECTED NOT DETECTED Final   Bordetella pertussis NOT DETECTED NOT DETECTED Final   Chlamydophila pneumoniae NOT DETECTED NOT DETECTED Final   Mycoplasma pneumoniae NOT DETECTED NOT DETECTED Final    Comment: Performed at [facility], [place] [address] [place], [place] [address]  MRSA PCR Screening     Status: None   Collection Time: 09/21/18  9:44 AM  Result Value Ref Range Status   MRSA by PCR NEGATIVE NEGATIVE Final    Comment:        The GeneXpert MRSA Assay (FDA approved for NASAL specimens only), is one component of a comprehensive MRSA colonization surveillance program. It is not intended to diagnose MRSA infection nor to guide or monitor treatment for MRSA infections. Performed at [facility], [place] [address], [place], [place] [address]   Urine Culture     Status: Abnormal   Collection Time: 09/21/18  6:20 PM  Result Value Ref Range Status   Specimen Description URINE, RANDOM  Final   Special Requests NONE  Final   Culture (A)  Final    <10,000 COLONIES/mL INSIGNIFICANT GROWTH Performed at [facility], [place] [address], [place], [place] [address]    Report Status 09/22/2018 FINAL  Final  Wet prep, genital     Status: Abnormal   Collection Time: 09/23/18  5:09 PM  Result Value Ref Range Status   Yeast Wet Prep HPF POC NONE SEEN NONE SEEN Final    Comment: Specimen diluted due to transport tube containing more than 1 ml of saline, interpret results with caution.   Trich, Wet Prep NONE SEEN NONE SEEN Final   Clue Cells Wet Prep HPF POC PRESENT (A) NONE SEEN Final   WBC, Wet Prep HPF POC FEW (A) NONE SEEN Final   Sperm NONE SEEN  Final    Comment: Performed at [facility], [place] [address] [place], [place] [address]     Scheduled Meds: . sodium chloride   Intravenous Once  . azaTHIOprine  50 mg Oral  BID  . cyanocobalamin  1,000 mcg Intramuscular Once per  day on Mon Thu  . enoxaparin (LOVENOX) injection  30 mg Subcutaneous Q24H  . famotidine  20 mg Oral BID  . hydrocortisone cream   Topical TID  . metroNIDAZOLE  500 mg Oral BID  . predniSONE  40 mg Oral Q breakfast  . prenatal multivitamin  1 tablet Oral Q1200  . sodium bicarbonate  650 mg Oral BID   Continuous Infusions: . sodium chloride 50 mL/hr at 09/27/18 0542     LOS: 8 days   Phillips Climes, MD [facility] Office  [phone] Pager - Text Page per Shea Evans  If 7PM-7AM, please contact night-coverage per Amion 09/28/2018, 2:56 PM

## 2018-09-28 NOTE — Progress Notes (Signed)
[facility] Progress Note    Assessment/ Plan:   1. AKI- in setting of [redacted] weeks gestation and history of Lupus as well as N/anorexia and diarrhea. 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 She is on solumedrol bolus and will reach out to [facility] for recommendations on immunosuppressive agents but I am leaning towards azothioprine as she will not require regular trough levels since she is living in a shelter and has lower nephrotoxiciy compared to cyclosporine and tacrolimus. 1. Startedimuran 50mg  bid (not to exceed 2 mg/kg/day) and follow up with Dr. Marval Regal once stable for discharge in 2 weeks. 2. Fortunately cr has peaked and now lower today; may have been a component of early ATN or prerenal azotemia. 3. Started prednisone 40mg  daily 10/28  4. I also counseled her that if renal function worsens she may have to make the difficult decision of aborting and then receiving definitive treatment (cellcept or cytoxan). Fortunately we did not have to make that decision today. Also counseled her about the need to continue taking all meds when she's d/c'd. 5. Started NaHco3 PO BID on 10/28 2. Hyperkalemia- due to AKI.Improvedwithisotonic bicarb and veltassa.  - Stop the veltassa and K has been stable. 3. SLE- diagnosed in August 2019and wasnot on any medicationsPTA 4. [redacted] weeks gestation- per OB 5. Diarrhea- stool cultures and viral studiesnegative. No recent abx. 6. Anemia- significant drop over the past 2 months. Guaiac stools and follow H/H  7. Disposition- Currently residing in an homeless shelterand will plan for dischargewhenher renal function stabilizesoff of IVF's.  Subjective:   Denies f/c/n/v.  No events  overnight.   Objective:   BP (!) 157/98 (BP Location: Right Arm)   Pulse (!) 56   Temp 98.6 F (37 C)   Resp 18   Wt 74.8 kg   LMP 06/22/2018   SpO2 98%   BMI 25.83 kg/m   Intake/Output Summary (Last 24 hours) at 09/28/2018 1331 Last data filed at 09/27/2018 1800 Gross per 24 hour  Intake 1390.26 ml  Output -  Net 1390.26 ml   Weight change:   Physical Exam: Gen:NAD CVS:no rub Resp:cta Abd:[redacted] weeks gestation, mild tenderness to palpation Ext:no edema  Imaging: Vas [place] Lower Extremity Venous (dvt)  Result Date: 09/27/2018  Lower Venous Study Indications: Elevated Ddimer.  Performing Technologist: Oliver Hum RVT  Examination Guidelines: A complete evaluation includes B-mode imaging, spectral Doppler, color Doppler, and power Doppler as needed of all accessible portions of each vessel. Bilateral testing is considered an integral part of a complete examination. Limited examinations for reoccurring indications may be performed as noted.  Right Venous Findings: +---------+---------------+---------+-----------+----------+-------+          CompressibilityPhasicitySpontaneityPropertiesSummary +---------+---------------+---------+-----------+----------+-------+ CFV      Full           Yes      Yes                          +---------+---------------+---------+-----------+----------+-------+ SFJ      Full                                                 +---------+---------------+---------+-----------+----------+-------+  FV Prox  Full                                                 +---------+---------------+---------+-----------+----------+-------+ FV Mid   Full                                                 +---------+---------------+---------+-----------+----------+-------+ FV DistalFull                                                 +---------+---------------+---------+-----------+----------+-------+ PFV      Full                                                  +---------+---------------+---------+-----------+----------+-------+ POP      Full           Yes      Yes                          +---------+---------------+---------+-----------+----------+-------+ PTV      Full                                                 +---------+---------------+---------+-----------+----------+-------+ PERO     Full                                                 +---------+---------------+---------+-----------+----------+-------+  Left Venous Findings: +---------+---------------+---------+-----------+----------+-------+          CompressibilityPhasicitySpontaneityPropertiesSummary +---------+---------------+---------+-----------+----------+-------+ CFV      Full           Yes      Yes                          +---------+---------------+---------+-----------+----------+-------+ SFJ      Full                                                 +---------+---------------+---------+-----------+----------+-------+ FV Prox  Full                                                 +---------+---------------+---------+-----------+----------+-------+ FV Mid   Full                                                 +---------+---------------+---------+-----------+----------+-------+  FV DistalFull                                                 +---------+---------------+---------+-----------+----------+-------+ PFV      Full                                                 +---------+---------------+---------+-----------+----------+-------+ POP      Full           Yes      Yes                          +---------+---------------+---------+-----------+----------+-------+ PTV      Full                                                 +---------+---------------+---------+-----------+----------+-------+ PERO     Full                                                  +---------+---------------+---------+-----------+----------+-------+    Summary: Right: There is no evidence of deep vein thrombosis in the lower extremity. No cystic structure found in the popliteal fossa. Left: There is no evidence of deep vein thrombosis in the lower extremity. No cystic structure found in the popliteal fossa.  *See table(s) above for measurements and observations. Electronically signed by Deitra Mayo MD on 09/27/2018 at 5:28:03 PM.    Final     Labs: BMET Recent Labs  Lab 09/22/18 9024 09/23/18 0973 09/24/18 0445 09/25/18 0420 09/26/18 0426 09/27/18 0006 09/28/18 0520  NA 134* 131* 134* 133* 135 134* 136  K 4.6 4.7 4.2 4.4 4.0 3.8 4.3  CL 105 106 101 103 105 110 112*  CO2 23 22 25  21* 20* 17* 19*  GLUCOSE 85 85 84 137* 113* 138* 105*  BUN 13 14 15 19  28* 29* 28*  CREATININE 2.27* 2.37* 2.36* 2.51* 2.70* 2.54* 2.00*  CALCIUM 7.8* 7.7* 7.6* 7.9* 8.3* 7.8* 7.6*  PHOS 5.4* 4.7* 5.1* 4.0 4.5 3.3 3.5   CBC Recent Labs  Lab 09/22/18 0357 09/23/18 0347 09/24/18 0445 09/28/18 0520  WBC 4.3 5.0 5.1 12.0*  HGB 6.9* 8.9* 8.4* 8.0*  HCT 20.3* 26.5* 24.6* 24.6*  MCV 84.6 86.3 85.4 89.5  PLT 211 188 171 227    Medications:    . sodium chloride   Intravenous Once  . azaTHIOprine  50 mg Oral BID  . cyanocobalamin  1,000 mcg Intramuscular Once per day on Mon Thu  . enoxaparin (LOVENOX) injection  30 mg Subcutaneous Q24H  . famotidine  20 mg Oral BID  . hydrocortisone cream   Topical TID  . metroNIDAZOLE  500 mg Oral BID  . predniSONE  40 mg Oral Q breakfast  . prenatal multivitamin  1 tablet Oral Q1200  . sodium bicarbonate  650 mg Oral BID      Otelia Santee, MD 09/28/2018, 1:31 PM

## 2018-09-29 DIAGNOSIS — R079 Chest pain, unspecified: Secondary | ICD-10-CM

## 2018-09-29 DIAGNOSIS — Z3A15 15 weeks gestation of pregnancy: Secondary | ICD-10-CM

## 2018-09-29 DIAGNOSIS — D649 Anemia, unspecified: Secondary | ICD-10-CM

## 2018-09-29 DIAGNOSIS — N179 Acute kidney failure, unspecified: Secondary | ICD-10-CM

## 2018-09-29 LAB — RENAL FUNCTION PANEL
ANION GAP: 4 — AB (ref 5–15)
Albumin: 2.1 g/dL — ABNORMAL LOW (ref 3.5–5.0)
BUN: 24 mg/dL — ABNORMAL HIGH (ref 6–20)
CALCIUM: 7.7 mg/dL — AB (ref 8.9–10.3)
CO2: 19 mmol/L — AB (ref 22–32)
Chloride: 114 mmol/L — ABNORMAL HIGH (ref 98–111)
Creatinine, Ser: 1.73 mg/dL — ABNORMAL HIGH (ref 0.44–1.00)
GFR calc non Af Amer: 37 mL/min — ABNORMAL LOW (ref >60)
GFR, EST AFRICAN AMERICAN: 43 mL/min — AB (ref >60)
GLUCOSE: 94 mg/dL (ref 70–99)
POTASSIUM: 3.7 mmol/L (ref 3.5–5.1)
Phosphorus: 2.6 mg/dL (ref 2.5–4.6)
Sodium: 137 mmol/L (ref 135–145)

## 2018-09-29 MED ORDER — CYANOCOBALAMIN 1000 MCG/ML IJ SOLN: 1000.0000 ug | INTRAMUSCULAR | 0 refills | Status: DC

## 2018-09-29 MED ORDER — SODIUM BICARBONATE 650 MG PO TABS: 650.0000 mg | ORAL_TABLET | Freq: Two times a day (BID) | ORAL | 0 refills | Status: DC

## 2018-09-29 MED ORDER — FAMOTIDINE 20 MG PO TABS: 20.0000 mg | ORAL_TABLET | Freq: Two times a day (BID) | ORAL | 0 refills | Status: DC

## 2018-09-29 MED ORDER — PRENATAL MULTIVITAMIN CH: 1.0000 | ORAL_TABLET | Freq: Every day | ORAL | 0 refills | Status: DC

## 2018-09-29 MED ORDER — AZATHIOPRINE 50 MG PO TABS: 50.0000 mg | ORAL_TABLET | Freq: Two times a day (BID) | ORAL | 0 refills | Status: DC

## 2018-09-29 MED ORDER — METRONIDAZOLE 500 MG PO TABS: 500.0000 mg | ORAL_TABLET | Freq: Two times a day (BID) | ORAL | 0 refills | Status: DC

## 2018-09-29 MED ORDER — FOLIC ACID 1 MG PO TABS: 1.0000 mg | ORAL_TABLET | Freq: Every day | ORAL | 0 refills | Status: DC

## 2018-09-29 MED ORDER — FOLIC ACID 1 MG PO TABS
1.0000 mg | ORAL_TABLET | Freq: Every day | ORAL | Status: DC
  Administered 2018-09-29: 1 mg via ORAL
  Filled 2018-09-29: qty 1

## 2018-09-29 MED ORDER — PREDNISONE 20 MG PO TABS: 40.0000 mg | ORAL_TABLET | Freq: Every day | ORAL | 0 refills | Status: DC

## 2018-09-29 MED FILL — B-12 1000 MCG TABS: 1000 | 30 days supply | Qty: 30 | Fill #0

## 2018-09-29 MED FILL — predniSONE 20 MG TABS: 20 | 30 days supply | Qty: 60 | Fill #0

## 2018-09-29 MED FILL — SODIUM BICARB 10 GRAIN TAB: 650 | 30 days supply | Qty: 60 | Fill #0

## 2018-09-29 MED FILL — FOLIC ACID 1 MG TABS: 1 | 30 days supply | Qty: 30 | Fill #0

## 2018-09-29 MED FILL — azaTHIOprine 50 MG TABS: 50 | 30 days supply | Qty: 60 | Fill #0

## 2018-09-29 MED FILL — FAMOTIDINE 20 MG TABLET: 20 | 15 days supply | Qty: 30 | Fill #0

## 2018-09-29 MED FILL — metroNIDAZOLE 500 MG TABS: 500 | 3 days supply | Qty: 6 | Fill #0

## 2018-09-29 NOTE — Progress Notes (Signed)
[facility] Progress Note    Assessment/ Plan:   1. AKI- in setting of [redacted] weeks gestation and history of Lupus as well as N/anorexia and diarrhea. 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 She is on solumedrol bolus and will reach out to [facility] for recommendations on immunosuppressive agents but I am leaning towards azothioprine as she will not require regular trough levels since she is living in a shelter and has lower nephrotoxiciy compared to cyclosporine and tacrolimus. 1. Startedimuran 50mg  bid (not to exceed 2 mg/kg/day) and follow up withDr. Delice Bison stable for discharge in 2 weeks -> appt on Wed Nov 20th 4PM at [address] w/ [facility] 2. Fortunately cr has peaked and now improving; may have been a component of early ATN or prerenal azotemia. 3. Startedprednisone 40mg  daily 10/28  4. I also counseled her that if renal function worsens she may have to make the difficult decision of aborting and then receiving definitive treatment (cellcept or cytoxan). Fortunately we did not have to make that decision today. Also counseled her about the need to continue taking all meds when she's d/c'd. 5. Started NaHco3 PO BID on 10/28 -> she may be able to come off if renal function continues to improve 2. Hyperkalemia- due to AKI.Improvedwithisotonic bicarb and veltassa.  - Stopped the veltassa and K has been stable. 3. SLE- diagnosed in August 2019and wasnot on any medicationsPTA 4. [redacted] weeks gestation- per OB 5. Diarrhea- stool cultures and viral studiesnegative. No recent abx. 6. Anemia- significant drop over the past 2 months. Guaiac stools and follow H/H  7. Disposition- Currently residing in an  homeless shelter; stable for d/c from renal standpoint.  Subjective:   Denies f/c/n/v.  No events overnight.  Best friend is bedside.   Objective:   BP (!) 143/83 (BP Location: Right Arm)   Pulse (!) 55   Temp 98.6 F (37 C) (Oral)   Resp 15   Wt 74.8 kg   LMP 06/22/2018   SpO2 100%   BMI 25.83 kg/m   Intake/Output Summary (Last 24 hours) at 09/29/2018 1244 Last data filed at 09/29/2018 0600 Gross per 24 hour  Intake 810 ml  Output -  Net 810 ml   Weight change:   Physical Exam: Gen:NAD CVS:no rub Resp:cta Abd:[redacted] weeks gestation, mild tenderness to palpation Ext:no edema  Imaging: No results found.  Labs: BMET Recent Labs  Lab 09/23/18 0347 09/24/18 0445 09/25/18 0420 09/26/18 0426 09/27/18 0006 09/28/18 0520 09/29/18 0355  NA 131* 134* 133* 135 134* 136 137  K 4.7 4.2 4.4 4.0 3.8 4.3 3.7  CL 106 101 103 105 110 112* 114*  CO2 22 25 21* 20* 17* 19* 19*  GLUCOSE 85 84 137* 113* 138* 105* 94  BUN 14 15 19  28* 29* 28* 24*  CREATININE 2.37* 2.36* 2.51* 2.70* 2.54* 2.00* 1.73*  CALCIUM 7.7* 7.6* 7.9* 8.3* 7.8* 7.6* 7.7*  PHOS 4.7* 5.1* 4.0 4.5 3.3 3.5 2.6   CBC Recent Labs  Lab 09/23/18 0347 09/24/18 0445 09/28/18 0520  WBC 5.0 5.1 12.0*  HGB 8.9* 8.4* 8.0*  HCT 26.5* 24.6* 24.6*  MCV 86.3 85.4 89.5  PLT 188 171 227    Medications:    . sodium chloride   Intravenous Once  . azaTHIOprine  50  mg Oral BID  . cyanocobalamin  1,000 mcg Intramuscular Once per day on Mon Thu  . enoxaparin (LOVENOX) injection  30 mg Subcutaneous Q24H  . famotidine  20 mg Oral BID  . folic acid  1 mg Oral Daily  . hydrocortisone cream   Topical TID  . metroNIDAZOLE  500 mg Oral BID  . predniSONE  40 mg Oral Q breakfast  . prenatal multivitamin  1 tablet Oral Q1200  . sodium bicarbonate  650 mg Oral BID      Otelia Santee, MD 09/29/2018, 12:44 PM

## 2018-09-29 NOTE — Care Management Note (Addendum)
Case Management Note  Patient Details  Name: Cathy [name] MRN: [id number] Date of Birth: Oct 15, 1981  Subjective/Objective:    Acute renal failure, chest pain, hyperkalemia, anemia                Action/Plan: NCM spoke to pt.  Pt was provided meds from [facility]. Contacted CSW for taxi voucher. [place] to arrange follow up appt. Left message for return call. Made pt aware. Pt will follow up at [facility] on Monday.   PCP Lanae Boast FNP  Expected Discharge Date:  09/29/18               Expected Discharge Plan:  Home/Self Care  In-House Referral:  NA  Discharge planning Services  CM Consult  Post Acute Care Choice:  NA Choice offered to:  NA  DME Arranged:  N/A DME Agency:  NA  HH Arranged:  NA HH Agency:  NA  Status of Service:  Completed, signed off  If discussed at [place] of Stay Meetings, dates discussed:    Additional Comments:  Erenest Rasher, RN 09/29/2018, 11:55 AM

## 2018-09-29 NOTE — Discharge Summary (Signed)
PATIENT DETAILS Name: Cathy [name] Age: 37 y.o. Sex: female Date of Birth: 03/26/1981 MRN: [id number]. Admitting Physician: Chancy Milroy, MD MLY:[id number], No Pcp Per  Admit Date: 09/20/2018 Discharge date: 09/29/2018  Recommendations for Outpatient Follow-up:  1. Follow up with PCP in 1-2 weeks 2. Please obtain BMP/CBC in one week 3. Please ensure follow-up with OB and nephrology 4. Needs vitamin B12 injections  Admitted From:  Home  Disposition: [place]: No  Equipment/Devices: None  Discharge Condition: Stable  CODE STATUS: FULL CODE  Diet recommendation:  Regular   Brief Summary: See H&P, Labs, Consult and Test reports for all details in brief, patient is a 37 year old female with history of bipolar disorder, recent diagnosis of SLE presenting with 1 week history of nausea, vomiting and abdominal pain-she was initially treated at [facility] as she was [redacted] weeks pregnant, due to concern for lupus nephritis with flare-she was transferred to [facility].  She underwent a renal biopsy-and was subsequently started on Imuran and prednisone-renal function is improving-we will need close follow-up with OB and nephrology post discharge.  See below for further details.  Brief Hospital Course: Acute renal failure: Secondary to lupus nephritis.  She did have proteinuria and hematuria on UA, complement levels were on the lower side.  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  She was treated with pulse steroids for 3 days, and subsequently started on Imuran and prednisone.  Per nephrology, she is on this regimen she is pregnant and homeless-and hence wanting to avoid cyclosporine and tacrolimus.  Renal function is improving-creatinine down to 1.73 on day of discharge.  Spoke with nephrologist-Dr. Lin-recommendations are to continue with Imuran and prednisone on discharge,  appointment with nephrology made for 11/20 at 4 PM.  She has been asked to keep this appointment.  Chest pain: Occurred on 10/27-resolved.  Lower extremity Dopplers were negative.  Due to low suspicion for pulmonary embolism-no further work-up was done.  Troponins were negative.  She is completely chest pain-free at this time.  Hyperkalemia: In setting of renal failure-has resolved  Anemia: Multifactorial-probably probably secondary to renal failure/acute illness-and vitamin B12 deficiency.  On prenatal vitamins/folic acid-we will need close monitoring in the outpatient setting.  Vitamin B12 deficiency: Started on supplementation-please continue in the outpatient setting.  Recently diagnosed with lupus: Is on prednisone and azathioprine-we will need outpatient follow-up with nephrology/rheumatology.  She is pregnant and will need very close monitoring.  She is aware that she is at high risk of losing her pregnancy if her lupus is not well controlled.  Pregnancy-16 weeks: She unfortunately did not receive any prenatal care prior to being diagnosed with this pregnancy-continue folic acid and prenatal vitamins.  She has a outpatient follow-up appointment with OB scheduled on 11/4.  Spoke with OB on-call (Dr. Leighton Ruff will try and get in touch with her and follow her closely at the [facility] clinic.  Bacterial vaginitis: Continue with metronidazole for a total of 1 week.  GC probe was negative.  Tobacco abuse: Counseled.  Procedures/Studies: 10/24>>Renal Biopsy  Discharge Diagnoses:  Active Problems:   Asthma   Lupus ([place])   Supervision of pregnancy with grand multiparity in second trimester   Substance abuse ([place])   Acute renal failure (ARF) (H[facility])   Chest pain   Dehydration   Discharge Instructions:  Activity:  As tolerated   Discharge Instructions    Call MD for:  difficulty breathing,  headache or visual disturbances   Complete by:  As directed    Call MD for:  persistant dizziness  or light-headedness   Complete by:  As directed    Call MD for:  persistant nausea and vomiting   Complete by:  As directed    Diet general   Complete by:  As directed    Discharge instructions   Complete by:  As directed    Follow with Primary MD  IN 1 WEEK  FOLLOW WITH NEPHROLOGY ON 11/20 AT 4 PM  FOLLOW WITH OBGYN ON 11/4 AT 9:55 AM  YOU ARE PREGNANT-PLEASE TALK WITH YOUR OB MD BEFORE STARTING ANY [place] Please get a complete blood count and chemistry panel checked by your Primary MD at your next visit, and again as instructed by your Primary MD.  Get Medicines reviewed and adjusted: Please take all your medications with you for your next visit with your Primary MD  Laboratory/radiological data: Please request your Primary MD to go over all hospital tests and procedure/radiological results at the follow up, please ask your Primary MD to get all Hospital records sent to his/her office.  In some cases, they will be blood work, cultures and biopsy results pending at the time of your discharge. Please request that your primary care M.D. follows up on these results.  Also Note the following: If you experience worsening of your admission symptoms, develop shortness of breath, life threatening emergency, suicidal or homicidal thoughts you must seek medical attention immediately by calling 911 or calling your MD immediately  if symptoms less severe.  You must read complete instructions/literature along with all the possible adverse reactions/side effects for all the Medicines you take and that have been prescribed to you. Take any new Medicines after you have completely understood and accpet all the possible adverse reactions/side effects.   Do not drive when taking Pain medications or sleeping medications (Benzodaizepines)  Do not take more than prescribed Pain, Sleep and Anxiety Medications. It is not advisable to combine anxiety,sleep and pain  medications without talking with your primary care practitioner  Special Instructions: If you have smoked or chewed Tobacco  in the last 2 yrs please stop smoking, stop any regular Alcohol  and or any Recreational drug use.  Wear Seat belts while driving.  Please note: You were cared for by a hospitalist during your hospital stay. Once you are discharged, your primary care physician will handle any further medical issues. Please note that NO REFILLS for any discharge medications will be authorized once you are discharged, as it is imperative that you return to your primary care physician (or establish a relationship with a primary care physician if you do not have one) for your post hospital discharge needs so that they can reassess your need for medications and monitor your lab values.   Increase activity slowly   Complete by:  As directed      Allergies as of 09/29/2018   No Known Allergies     Medication List    STOP taking these medications   loratadine 10 MG tablet Commonly known as:  CLARITIN     TAKE these medications   azaTHIOprine 50 MG tablet Commonly known as:  IMURAN Take 1 tablet (50 mg total) by mouth 2 (two) times daily.   cyanocobalamin 1000 MCG/ML injection Commonly known as:  (VITAMIN B-12) Inject 1 mL (1,000 mcg total) into the skin every 30 (thirty) days.   famotidine 20 MG tablet  Commonly known as:  PEPCID Take 1 tablet (20 mg total) by mouth 2 (two) times daily.   folic acid 1 MG tablet Commonly known as:  FOLVITE Take 1 tablet (1 mg total) by mouth daily.   metroNIDAZOLE 500 MG tablet Commonly known as:  FLAGYL Take 1 tablet (500 mg total) by mouth 2 (two) times daily.   predniSONE 20 MG tablet Commonly known as:  DELTASONE Take 2 tablets (40 mg total) by mouth daily with breakfast.   prenatal multivitamin Tabs tablet Take 1 tablet by mouth daily at 12 noon.   sodium bicarbonate 650 MG tablet Take 1 tablet (650 mg total) by mouth 2 (two)  times daily.      Follow-up Information    Truett Mainland, DO Follow up on 10/04/2018.   Specialty:  Family Medicine Why:  appointment at 9:55 am Contact information: [place] [address] [phone]        Donato Heinz, MD Follow up on 10/20/2018.   Specialty:  Nephrology Why:  APPT AT 4 PM Contact information: [place] [address] [phone]          No Known Allergies  Consultations:   nephrology  Other Procedures/Studies: Dg Chest 2 View  Result Date: 09/20/2018 CLINICAL DATA:  Central chest pain EXAM: CHEST - 2 VIEW COMPARISON:  07/28/2018 FINDINGS: Mild cardiomegaly. No confluent opacities, effusions or edema. No acute bony abnormality. IMPRESSION: Mild cardiomegaly.  No active disease. Electronically Signed   By: Rolm Baptise M.D.   On: 09/20/2018 21:32   US Renal  Result Date: 09/22/2018 CLINICAL DATA:  Acute renal failure. EXAM: RENAL / URINARY TRACT ULTRASOUND COMPLETE COMPARISON:  Ultrasound 09/20/2018.  CT 11/08/2010. FINDINGS: Right Kidney: Length: 13.0 cm. Increased echogenicity. No mass. Mild right hydronephrosis. Trace amount of fluid noted about the right kidney. Left Kidney: Length: 13.3 cm. Increased echogenicity. No mass or hydronephrosis visualized. Bladder: Appears normal for degree of bladder distention. Patient is pregnant. Reference is made to prior ultrasound of 09/20/2018. IMPRESSION: 1. Mild right hydronephrosis. Trace amount of fluid noted about the right kidney. 2. Increased echogenicity both kidneys consistent chronic medical renal disease. Electronically Signed   By: Marcello Moores  Register   On: 09/22/2018 12:36   US Biopsy (kidney)  Result Date: 09/23/2018 INDICATION: Worsening renal function, concern for lupus nephritis. Please from ultrasound-guided random renal biopsy for tissue diagnostic purposes. EXAM: ULTRASOUND GUIDED RENAL BIOPSY COMPARISON:  None. MEDICATIONS: None. ANESTHESIA/SEDATION:  Fentanyl 50 mcg IV Total Moderate Sedation time: 10 minutes; The patient was continuously monitored during the procedure by the interventional radiology nurse under my direct supervision. COMPLICATIONS: None immediate. PROCEDURE: Informed written consent was obtained from the patient after a discussion of the risks, benefits and alternatives to treatment. The patient understands and consents the procedure. A timeout was performed prior to the initiation of the procedure. Ultrasound scanning was performed of the bilateral flanks. The inferior pole of the left kidney was selected for biopsy due to location and sonographic window. The procedure was planned. The operative site was prepped and draped in the usual sterile fashion. The overlying soft tissues were anesthetized with 1% lidocaine with epinephrine. A 17 gauge core needle biopsy device was advanced into the inferior cortex of the left kidney and a core needle biopsy was attempted to be acquired however was unsuccessful given device failure. As such, a new device was obtained and 2 core biopsies were obtained of the inferior pole the left kidney under direct  ultrasound guidance. Images were saved for documentation purposes. The biopsy device was removed and hemostasis was obtained with manual compression. Post procedural scanning was negative for significant post procedural hemorrhage or additional complication. A dressing was placed. The patient tolerated the procedure well without immediate post procedural complication. IMPRESSION: Technically successful ultrasound guided left renal biopsy. Electronically Signed   By: Sandi Mariscal M.D.   On: 09/23/2018 12:42   Vas [place] Lower Extremity Venous (dvt)  Result Date: 09/27/2018  Lower Venous Study Indications: Elevated Ddimer.  Performing Technologist: Oliver Hum RVT  Examination Guidelines: A complete evaluation includes B-mode imaging, spectral Doppler, color Doppler, and power Doppler as needed of all  accessible portions of each vessel. Bilateral testing is considered an integral part of a complete examination. Limited examinations for reoccurring indications may be performed as noted.  Right Venous Findings: +---------+---------------+---------+-----------+----------+-------+          CompressibilityPhasicitySpontaneityPropertiesSummary +---------+---------------+---------+-----------+----------+-------+ CFV      Full           Yes      Yes                          +---------+---------------+---------+-----------+----------+-------+ SFJ      Full                                                 +---------+---------------+---------+-----------+----------+-------+ FV Prox  Full                                                 +---------+---------------+---------+-----------+----------+-------+ FV Mid   Full                                                 +---------+---------------+---------+-----------+----------+-------+ FV DistalFull                                                 +---------+---------------+---------+-----------+----------+-------+ PFV      Full                                                 +---------+---------------+---------+-----------+----------+-------+ POP      Full           Yes      Yes                          +---------+---------------+---------+-----------+----------+-------+ PTV      Full                                                 +---------+---------------+---------+-----------+----------+-------+ PERO     Full                                                 +---------+---------------+---------+-----------+----------+-------+  Left Venous Findings: +---------+---------------+---------+-----------+----------+-------+          CompressibilityPhasicitySpontaneityPropertiesSummary +---------+---------------+---------+-----------+----------+-------+ CFV      Full           Yes      Yes                           +---------+---------------+---------+-----------+----------+-------+ SFJ      Full                                                 +---------+---------------+---------+-----------+----------+-------+ FV Prox  Full                                                 +---------+---------------+---------+-----------+----------+-------+ FV Mid   Full                                                 +---------+---------------+---------+-----------+----------+-------+ FV DistalFull                                                 +---------+---------------+---------+-----------+----------+-------+ PFV      Full                                                 +---------+---------------+---------+-----------+----------+-------+ POP      Full           Yes      Yes                          +---------+---------------+---------+-----------+----------+-------+ PTV      Full                                                 +---------+---------------+---------+-----------+----------+-------+ PERO     Full                                                 +---------+---------------+---------+-----------+----------+-------+    Summary: Right: There is no evidence of deep vein thrombosis in the lower extremity. No cystic structure found in the popliteal fossa. Left: There is no evidence of deep vein thrombosis in the lower extremity. No cystic structure found in the popliteal fossa.  *See table(s) above for measurements and observations. Electronically signed by Deitra Mayo MD on 09/27/2018 at 5:28:03 PM.    Final    [place] Mfm Ob Limited  Result Date: 09/20/2018 ----------------------------------------------------------------------  OBSTETRICS REPORT                       (  Signed Final 09/20/2018 03:28 pm) ---------------------------------------------------------------------- Patient Info  ID #:       [id number]                          D.O.B.:  07/24/1981 (36 yrs)   Name:       Simonne Martinet Nodarse                Visit Date: 09/20/2018 02:18 pm ---------------------------------------------------------------------- Performed By  Performed By:     Novella Rob        Referred By:      Irene Limbo  Attending:        Tama High MD        Location:         [facility] ---------------------------------------------------------------------- Orders   #  Description                          Code         Ordered By   1  [place] MFM OB LIMITED                    22979.89     Mallie Snooks  ----------------------------------------------------------------------   #  Order #                    Accession #                 Episode #   1  [id number]                  [id number]                  [id number]  ---------------------------------------------------------------------- Indications   [redacted] weeks gestation of pregnancy                Z3A.15   Abdominal pain in pregnancy (diarrhea x 2      O99.89   weeks)  ---------------------------------------------------------------------- Vital Signs  Weight (lb): 145                               Height:        5'7"  BMI:         22.71 ---------------------------------------------------------------------- Fetal Evaluation  Num Of Fetuses:         1  Fetal Heart Rate(bpm):  148  Cardiac Activity:       Observed  Presentation:           Cephalic  Placenta:               Posterior  Amniotic Fluid  AFI FV:      Within normal limits  Largest Pocket(cm)                              6.32 ---------------------------------------------------------------------- OB History  Gravidity:    6         Term:   4         SAB:   1  Living:       4 ---------------------------------------------------------------------- Gestational Age  LMP:           12w 6d        Date:  06/22/18                 EDD:   03/29/19  Best:           [name] 6d     Det. By:  Previous Ultrasound      EDD:   03/08/19 ---------------------------------------------------------------------- Cervix Uterus Adnexa  Cervix  Length:           3.56  cm.  Normal appearance by transabdominal scan. ---------------------------------------------------------------------- Impression  Patient was evaluated for abdominal pain and diarrhea.  A limited ultrasound study was performed. Amniotic fluid is  normal and good fetal activity is seen. On transabdominal  scan, the cervix looks long and closed. ----------------------------------------------------------------------                  Tama High, MD Electronically Signed Final Report   09/20/2018 03:28 pm ----------------------------------------------------------------------     TODAY-DAY OF DISCHARGE:  Subjective:   [name] today has no headache,no chest abdominal pain,no new weakness tingling or numbness, feels much better wants to go home today.   Objective:   Blood pressure (!) 143/83, pulse (!) 55, temperature 98.6 F (37 C), temperature source Oral, resp. rate 15, weight 74.8 kg, last menstrual period 06/22/2018, SpO2 100 %.  Intake/Output Summary (Last 24 hours) at 09/29/2018 0951 Last data filed at 09/29/2018 0600 Gross per 24 hour  Intake 810 ml  Output -  Net 810 ml   Filed Weights   09/25/18 0624 09/26/18 0503 09/27/18 1130  Weight: 70.2 kg 70.2 kg 74.8 kg    Exam: Awake Alert, Oriented *3, No new F.N deficits, Normal affect [place].AT,PERRAL Supple Neck,No JVD, No cervical lymphadenopathy appriciated.  Symmetrical Chest wall movement, Good air movement bilaterally, CTAB RRR,No Gallops,Rubs or new Murmurs, No Parasternal Heave +ve B.Sounds, Abd Soft, Non tender, No organomegaly appriciated, No rebound -guarding or rigidity. No Cyanosis, Clubbing or edema, No new Rash or bruise   PERTINENT RADIOLOGIC STUDIES: Dg Chest 2 View  Result Date: 09/20/2018 CLINICAL DATA:  Central chest  pain EXAM: CHEST - 2 VIEW COMPARISON:  07/28/2018 FINDINGS: Mild cardiomegaly. No confluent opacities, effusions or edema. No acute bony abnormality. IMPRESSION: Mild cardiomegaly.  No active disease. Electronically Signed   By: Rolm Baptise M.D.   On: 09/20/2018 21:32   US Renal  Result Date: 09/22/2018 CLINICAL DATA:  Acute renal failure. EXAM: RENAL / URINARY TRACT ULTRASOUND COMPLETE COMPARISON:  Ultrasound 09/20/2018.  CT 11/08/2010. FINDINGS: Right Kidney: Length: 13.0 cm. Increased echogenicity. No mass. Mild right hydronephrosis. Trace amount of fluid noted about the right kidney. Left Kidney: Length: 13.3 cm. Increased echogenicity. No mass or hydronephrosis visualized. Bladder: Appears normal for degree of bladder distention. Patient is pregnant. Reference is made to prior ultrasound of 09/20/2018. IMPRESSION: 1. Mild right hydronephrosis. Trace amount of fluid noted about the right kidney. 2. Increased echogenicity both kidneys consistent chronic medical renal disease. Electronically Signed  By: [place]   On: 09/22/2018 12:36   US Biopsy (kidney)  Result Date: 09/23/2018 INDICATION: Worsening renal function, concern for lupus nephritis. Please from ultrasound-guided random renal biopsy for tissue diagnostic purposes. EXAM: ULTRASOUND GUIDED RENAL BIOPSY COMPARISON:  None. MEDICATIONS: None. ANESTHESIA/SEDATION: Fentanyl 50 mcg IV Total Moderate Sedation time: 10 minutes; The patient was continuously monitored during the procedure by the interventional radiology nurse under my direct supervision. COMPLICATIONS: None immediate. PROCEDURE: Informed written consent was obtained from the patient after a discussion of the risks, benefits and alternatives to treatment. The patient understands and consents the procedure. A timeout was performed prior to the initiation of the procedure. Ultrasound scanning was performed of the bilateral flanks. The inferior pole of the left kidney was selected  for biopsy due to location and sonographic window. The procedure was planned. The operative site was prepped and draped in the usual sterile fashion. The overlying soft tissues were anesthetized with 1% lidocaine with epinephrine. A 17 gauge core needle biopsy device was advanced into the inferior cortex of the left kidney and a core needle biopsy was attempted to be acquired however was unsuccessful given device failure. As such, a new device was obtained and 2 core biopsies were obtained of the inferior pole the left kidney under direct ultrasound guidance. Images were saved for documentation purposes. The biopsy device was removed and hemostasis was obtained with manual compression. Post procedural scanning was negative for significant post procedural hemorrhage or additional complication. A dressing was placed. The patient tolerated the procedure well without immediate post procedural complication. IMPRESSION: Technically successful ultrasound guided left renal biopsy. Electronically Signed   By: Sandi Mariscal M.D.   On: 09/23/2018 12:42   Vas [place] Lower Extremity Venous (dvt)  Result Date: 09/27/2018  Lower Venous Study Indications: Elevated Ddimer.  Performing Technologist: Oliver Hum RVT  Examination Guidelines: A complete evaluation includes B-mode imaging, spectral Doppler, color Doppler, and power Doppler as needed of all accessible portions of each vessel. Bilateral testing is considered an integral part of a complete examination. Limited examinations for reoccurring indications may be performed as noted.  Right Venous Findings: +---------+---------------+---------+-----------+----------+-------+          CompressibilityPhasicitySpontaneityPropertiesSummary +---------+---------------+---------+-----------+----------+-------+ CFV      Full           Yes      Yes                          +---------+---------------+---------+-----------+----------+-------+ SFJ      Full                                                  +---------+---------------+---------+-----------+----------+-------+ FV Prox  Full                                                 +---------+---------------+---------+-----------+----------+-------+ FV Mid   Full                                                 +---------+---------------+---------+-----------+----------+-------+ FV DistalFull                                                 +---------+---------------+---------+-----------+----------+-------+  PFV      Full                                                 +---------+---------------+---------+-----------+----------+-------+ POP      Full           Yes      Yes                          +---------+---------------+---------+-----------+----------+-------+ PTV      Full                                                 +---------+---------------+---------+-----------+----------+-------+ PERO     Full                                                 +---------+---------------+---------+-----------+----------+-------+  Left Venous Findings: +---------+---------------+---------+-----------+----------+-------+          CompressibilityPhasicitySpontaneityPropertiesSummary +---------+---------------+---------+-----------+----------+-------+ CFV      Full           Yes      Yes                          +---------+---------------+---------+-----------+----------+-------+ SFJ      Full                                                 +---------+---------------+---------+-----------+----------+-------+ FV Prox  Full                                                 +---------+---------------+---------+-----------+----------+-------+ FV Mid   Full                                                 +---------+---------------+---------+-----------+----------+-------+ FV DistalFull                                                  +---------+---------------+---------+-----------+----------+-------+ PFV      Full                                                 +---------+---------------+---------+-----------+----------+-------+ POP      Full           Yes      Yes                          +---------+---------------+---------+-----------+----------+-------+  PTV      Full                                                 +---------+---------------+---------+-----------+----------+-------+ PERO     Full                                                 +---------+---------------+---------+-----------+----------+-------+    Summary: Right: There is no evidence of deep vein thrombosis in the lower extremity. No cystic structure found in the popliteal fossa. Left: There is no evidence of deep vein thrombosis in the lower extremity. No cystic structure found in the popliteal fossa.  *See table(s) above for measurements and observations. Electronically signed by Deitra Mayo MD on 09/27/2018 at 5:28:03 PM.    Final    [place] Mfm Ob Limited  Result Date: 09/20/2018 ----------------------------------------------------------------------  OBSTETRICS REPORT                       (Signed Final 09/20/2018 03:28 pm) ---------------------------------------------------------------------- Patient Info  ID #:       [id number]                          D.O.B.:  09-21-1981 (36 yrs)  Name:       Simonne Martinet Taboada                Visit Date: 09/20/2018 02:18 pm ---------------------------------------------------------------------- Performed By  Performed By:     Novella Rob        Referred By:      Irene Limbo  Attending:        Tama High MD        Location:         [facility] ---------------------------------------------------------------------- Orders   #  Description                          Code         Ordered By   1  [place] MFM OB LIMITED                     74163.84     Mallie Snooks  ----------------------------------------------------------------------   #  Order #                    Accession #                 Episode #   1  [id number]  [id number]                  [id number]  ---------------------------------------------------------------------- Indications   [redacted] weeks gestation of pregnancy                Z3A.15   Abdominal pain in pregnancy (diarrhea x 2      O99.89   weeks)  ---------------------------------------------------------------------- Vital Signs  Weight (lb): 145                               Height:        5'7"  BMI:         22.71 ---------------------------------------------------------------------- Fetal Evaluation  Num Of Fetuses:         1  Fetal Heart Rate(bpm):  148  Cardiac Activity:       Observed  Presentation:           Cephalic  Placenta:               Posterior  Amniotic Fluid  AFI FV:      Within normal limits                              Largest Pocket(cm)                              6.32 ---------------------------------------------------------------------- OB History  Gravidity:    6         Term:   4         SAB:   1  Living:       4 ---------------------------------------------------------------------- Gestational Age  LMP:           12w 6d        Date:  06/22/18                 EDD:   03/29/19  Best:          15w 6d     Det. By:  Previous Ultrasound      EDD:   03/08/19 ---------------------------------------------------------------------- Cervix Uterus Adnexa  Cervix  Length:           3.56  cm.  Normal appearance by transabdominal scan. ---------------------------------------------------------------------- Impression  Patient was evaluated for abdominal pain and diarrhea.  A limited ultrasound study was performed. Amniotic fluid is  normal and good fetal activity is seen. On transabdominal  scan, the cervix looks long and closed.  ----------------------------------------------------------------------                  Tama High, MD Electronically Signed Final Report   09/20/2018 03:28 pm ----------------------------------------------------------------------    PERTINENT LAB RESULTS: CBC: Recent Labs    09/28/18 0520  WBC 12.0*  HGB 8.0*  HCT 24.6*  PLT 227   CMET CMP     Component Value Date/Time   NA 137 09/29/2018 0355   K 3.7 09/29/2018 0355   CL 114 (H) 09/29/2018 0355   CO2 19 (L) 09/29/2018 0355   GLUCOSE 94 09/29/2018 0355   BUN 24 (H) 09/29/2018 0355   CREATININE 1.73 (H) 09/29/2018 0355   CALCIUM 7.7 (L) 09/29/2018 0355   PROT 6.2 (L) 09/22/2018 0357   ALBUMIN 2.1 (L) 09/29/2018 0355   AST 18 09/22/2018 0357   ALT 24 09/22/2018 0357   ALKPHOS 51 09/22/2018 0357  BILITOT 0.4 09/22/2018 0357   GFRNONAA 37 (L) 09/29/2018 0355   GFRAA 43 (L) 09/29/2018 0355    GFR Estimated Creatinine Clearance: 48.4 mL/min (A) (by C-G formula based on SCr of 1.73 mg/dL (H)). No results for input(s): LIPASE, AMYLASE in the last 72 hours. Recent Labs    09/26/18 2325  TROPONINI <0.03   Invalid input(s): POCBNP Recent Labs    09/26/18 2325  DDIMER 2.03*   No results for input(s): HGBA1C in the last 72 hours. No results for input(s): CHOL, HDL, LDLCALC, TRIG, CHOLHDL, LDLDIRECT in the last 72 hours. No results for input(s): TSH, T4TOTAL, T3FREE, THYROIDAB in the last 72 hours.  Invalid input(s): FREET3 No results for input(s): VITAMINB12, FOLATE, FERRITIN, TIBC, IRON, RETICCTPCT in the last 72 hours. Coags: No results for input(s): INR in the last 72 hours.  Invalid input(s): PT Microbiology: Recent Results (from the past 240 hour(s))  Culture, OB Urine     Status: None   Collection Time: 09/20/18 12:38 PM  Result Value Ref Range Status   Specimen Description   Final    URINE, CLEAN CATCH Performed at [facility], [address], [place], [place] [address]    Special Requests    Final    NONE Performed at [facility], [place], [place] [address]    Culture   Final    NO GROWTH NO GROUP B STREP (S.AGALACTIAE) ISOLATED Performed at [facility], [place] [address] [place], [place] [address]    Report Status 09/21/2018 FINAL  Final  Gastrointestinal Panel by PCR , Stool     Status: None   Collection Time: 09/21/18  2:11 AM  Result Value Ref Range Status   Campylobacter species NOT DETECTED NOT DETECTED Final   Plesimonas shigelloides NOT DETECTED NOT DETECTED Final   Salmonella species NOT DETECTED NOT DETECTED Final   Yersinia enterocolitica NOT DETECTED NOT DETECTED Final   Vibrio species NOT DETECTED NOT DETECTED Final   Vibrio cholerae NOT DETECTED NOT DETECTED Final   Enteroaggregative E coli (EAEC) NOT DETECTED NOT DETECTED Final   Enteropathogenic E coli (EPEC) NOT DETECTED NOT DETECTED Final   Enterotoxigenic E coli (ETEC) NOT DETECTED NOT DETECTED Final   Shiga like toxin producing E coli (STEC) NOT DETECTED NOT DETECTED Final   Shigella/Enteroinvasive E coli (EIEC) NOT DETECTED NOT DETECTED Final   Cryptosporidium NOT DETECTED NOT DETECTED Final   Cyclospora cayetanensis NOT DETECTED NOT DETECTED Final   Entamoeba histolytica NOT DETECTED NOT DETECTED Final   Giardia lamblia NOT DETECTED NOT DETECTED Final   Adenovirus F40/41 NOT DETECTED NOT DETECTED Final   Astrovirus NOT DETECTED NOT DETECTED Final   Norovirus GI/GII NOT DETECTED NOT DETECTED Final   Rotavirus A NOT DETECTED NOT DETECTED Final   Sapovirus (I, II, IV, and V) NOT DETECTED NOT DETECTED Final    Comment: Performed at [facility], [address] [place], [place] [address]  Respiratory Panel by PCR     Status: None   Collection Time: 09/21/18  5:07 AM  Result Value Ref Range Status   Adenovirus NOT DETECTED NOT DETECTED Final   Coronavirus 229E NOT DETECTED NOT DETECTED Final   Coronavirus HKU1 NOT DETECTED NOT DETECTED Final   Coronavirus NL63  NOT DETECTED NOT DETECTED Final   Coronavirus OC43 NOT DETECTED NOT DETECTED Final   Metapneumovirus NOT DETECTED NOT DETECTED Final   Rhinovirus / Enterovirus NOT DETECTED NOT DETECTED Final   Influenza A NOT DETECTED NOT  DETECTED Final   Influenza B NOT DETECTED NOT DETECTED Final   Parainfluenza Virus 1 NOT DETECTED NOT DETECTED Final   Parainfluenza Virus 2 NOT DETECTED NOT DETECTED Final   Parainfluenza Virus 3 NOT DETECTED NOT DETECTED Final   Parainfluenza Virus 4 NOT DETECTED NOT DETECTED Final   Respiratory Syncytial Virus NOT DETECTED NOT DETECTED Final   Bordetella pertussis NOT DETECTED NOT DETECTED Final   Chlamydophila pneumoniae NOT DETECTED NOT DETECTED Final   Mycoplasma pneumoniae NOT DETECTED NOT DETECTED Final    Comment: Performed at [facility], [place] [address], [place], [place] [address]  MRSA PCR Screening     Status: None   Collection Time: 09/21/18  9:44 AM  Result Value Ref Range Status   MRSA by PCR NEGATIVE NEGATIVE Final    Comment:        The GeneXpert MRSA Assay (FDA approved for NASAL specimens only), is one component of a comprehensive MRSA colonization surveillance program. It is not intended to diagnose MRSA infection nor to guide or monitor treatment for MRSA infections. Performed at [facility], [place] [address], [place], [place] [address]   Urine Culture     Status: Abnormal   Collection Time: 09/21/18  6:20 PM  Result Value Ref Range Status   Specimen Description URINE, RANDOM  Final   Special Requests NONE  Final   Culture (A)  Final    <10,000 COLONIES/mL INSIGNIFICANT GROWTH Performed at [facility], [place] [address], [place], [place] [address]    Report Status 09/22/2018 FINAL  Final  Wet prep, genital     Status: Abnormal   Collection Time: 09/23/18  5:09 PM  Result Value Ref Range Status   Yeast Wet Prep HPF POC NONE SEEN NONE SEEN Final    Comment: Specimen diluted due to transport tube containing more than 1  ml of saline, interpret results with caution.   Trich, Wet Prep NONE SEEN NONE SEEN Final   Clue Cells Wet Prep HPF POC PRESENT (A) NONE SEEN Final   WBC, Wet Prep HPF POC FEW (A) NONE SEEN Final   Sperm NONE SEEN  Final    Comment: Performed at [facility], [place] [address] [place], [place] [address]    FURTHER DISCHARGE INSTRUCTIONS:  Get Medicines reviewed and adjusted: Please take all your medications with you for your next visit with your Primary MD  Laboratory/radiological data: Please request your Primary MD to go over all hospital tests and procedure/radiological results at the follow up, please ask your Primary MD to get all Hospital records sent to his/her office.  In some cases, they will be blood work, cultures and biopsy results pending at the time of your discharge. Please request that your primary care M.D. goes through all the records of your hospital data and follows up on these results.  Also Note the following: If you experience worsening of your admission symptoms, develop shortness of breath, life threatening emergency, suicidal or homicidal thoughts you must seek medical attention immediately by calling 911 or calling your MD immediately  if symptoms less severe.  You must read complete instructions/literature along with all the possible adverse reactions/side effects for all the Medicines you take and that have been prescribed to you. Take any new Medicines after you have completely understood and accpet all the possible adverse reactions/side effects.   Do not drive when taking Pain medications or sleeping medications (Benzodaizepines)  Do not take more than prescribed  Pain, Sleep and Anxiety Medications. It is not advisable to combine anxiety,sleep and pain medications without talking with your primary care practitioner  Special Instructions: If you have smoked or chewed Tobacco  in the last 2 yrs please stop smoking, stop any regular Alcohol  and or any  Recreational drug use.  Wear Seat belts while driving.  Please note: You were cared for by a hospitalist during your hospital stay. Once you are discharged, your primary care physician will handle any further medical issues. Please note that NO REFILLS for any discharge medications will be authorized once you are discharged, as it is imperative that you return to your primary care physician (or establish a relationship with a primary care physician if you do not have one) for your post hospital discharge needs so that they can reassess your need for medications and monitor your lab values.  Total Time spent coordinating discharge including counseling, education and face to face time equals 35 minutes.  SignedOren Binet 09/29/2018 9:51 AM

## 2018-10-01 ENCOUNTER — Encounter (HOSPITAL_COMMUNITY): Payer: Self-pay | Admitting: Nephrology

## 2018-10-04 ENCOUNTER — Ambulatory Visit: Payer: Self-pay | Admitting: Clinical

## 2018-10-04 ENCOUNTER — Encounter: Payer: Self-pay | Admitting: Family Medicine

## 2018-10-04 ENCOUNTER — Ambulatory Visit (INDEPENDENT_AMBULATORY_CARE_PROVIDER_SITE_OTHER): Payer: Medicaid Other | Admitting: Family Medicine

## 2018-10-04 VITALS — BP 159/96 | HR 112 | Wt 178.9 lb

## 2018-10-04 DIAGNOSIS — O162 Unspecified maternal hypertension, second trimester: Secondary | ICD-10-CM

## 2018-10-04 DIAGNOSIS — N179 Acute kidney failure, unspecified: Secondary | ICD-10-CM

## 2018-10-04 DIAGNOSIS — O0992 Supervision of high risk pregnancy, unspecified, second trimester: Secondary | ICD-10-CM

## 2018-10-04 DIAGNOSIS — O0990 Supervision of high risk pregnancy, unspecified, unspecified trimester: Secondary | ICD-10-CM

## 2018-10-04 DIAGNOSIS — O99012 Anemia complicating pregnancy, second trimester: Secondary | ICD-10-CM

## 2018-10-04 DIAGNOSIS — M329 Systemic lupus erythematosus, unspecified: Secondary | ICD-10-CM

## 2018-10-04 MED ORDER — ASPIRIN EC 81 MG PO TBEC: 81.0000 mg | DELAYED_RELEASE_TABLET | Freq: Every day | ORAL | 2 refills | Status: DC

## 2018-10-04 NOTE — Patient Instructions (Signed)
Places to have your son circumcised:    [facility] [phone] $[phone] while you are in hospital  [facility] [phone] $[phone] by 4 wks  [facility] [phone] $[phone] by 2 wks  [facility] [phone] $[phone] by 7 days [facility] [phone] $[phone] by 4 wks  These prices sometimes change but are roughly what you can expect to pay. Please call and confirm pricing.   Circumcision is considered an elective/non-medically necessary procedure. There are many reasons parents decide to have their sons circumsized. During the first year of life circumcised males have a reduced risk of urinary tract infections but after this year the rates between circumcised males and uncircumcised males are the same.  It is safe to have your son circumcised outside of the hospital and the places above perform them regularly.   Deciding about Circumcision in Baby Boys  (Up-to-date The Basics)  What is circumcision?  Circumcision is a surgery that removes the skin that covers the tip of the penis, called the "foreskin" Circumcision is usually done when a boy is between 1 and 10 days old. In the [place], circumcision is common. In some other countries, fewer boys are circumcised. Circumcision is a common tradition in some religions.  Should I have my baby boy circumcised?  There is no easy answer. Circumcision has some benefits. But it also has risks. After talking with your doctor, you will have to decide for yourself what is right for your family.  What are the benefits of circumcision?  Circumcised boys seem to have slightly lower rates of: ?Urinary tract infections ?Swelling of the opening at the tip of the penis Circumcised men seem to have slightly lower rates of: ?Urinary tract infections ?Swelling of the opening at the tip of the penis ?Penis  cancer ?HIV and other infections that you catch during sex ?Cervical cancer in the women they have sex with Even so, in the [place], the risks of these problems are small - even in boys and men who have not been circumcised. Plus, boys and men who are not circumcised can reduce these extra risks by: ?Cleaning their penis well ?Using condoms during sex  What are the risks of circumcision?  Risks include: ?Bleeding or infection from the surgery ?Damage to or amputation of the penis ?A chance that the doctor will cut off too much or not enough of the foreskin ?A chance that sex won't feel as good later in life Only about 1 out of every 200 circumcisions leads to problems. There is also a chance that your health insurance won't pay for circumcision.  How is circumcision done in baby boys?  First, the baby gets medicine for pain relief. This might be a cream on the skin or a shot into the base of the penis. Next, the doctor cleans the baby's penis well. Then he or she uses special tools to cut off the foreskin. Finally, the doctor wraps a bandage (called gauze) around the baby's penis. If you have your baby circumcised, his doctor or nurse will give you instructions on how to care for him after the surgery. It is important that you follow those instructions carefully.  AREA PEDIATRIC/FAMILY PRACTICE PHYSICIANS  [facility] [address] [place], [place]  [address] Phone - [phone]   Fax - [phone]  [facility] [address] [place], [place] [address] Phone - [phone]   Fax - [phone]  [facility] [address] [place], [place]    [place] Phone - [phone]   Fax - [phone]  [place] [address], [place] [address] [place], [place]  [address] Phone - [phone]   Fax - [phone]  [place] [address] [place], [place]  [address] Phone - [phone]   Fax -  [phone]  [facility] [address] [place], [place]  [address] Phone - [phone]   Fax - [place] [address] [place], [place]  [address] Phone - [phone]   Fax - [phone]  [place] [address] [place], [place] [address] [place], [place]  [address] Phone - [phone]   Fax - [place] [address] [place], [place]  [address] Phone - [phone]   Fax - [phone] [facility] [place] [address] [place], [place]  [address] Phone - [phone]   Fax - [phone]  [place] [address] [place], [place]  [address] Phone - [phone]   Fax - [phone]  [facility] [place], [place], [place]  [address] Phone - [phone]   Fax - [place] [address] [place], [place]  [address] Phone - [phone]   Fax - [phone]  [facility] [address], [place], [place]  [address] Phone - [place], [place]  [address] Phone - [phone]   Fax - [place] [address] [place], [place]  [address] Phone - [phone]   Fax - [phone]  [place] [address] [place], [place]  [address] Phone - [phone]   Fax - [place], [place]  [address] Phone - [phone]   Fax - [place], [place], [place]  [address] Phone - [phone]   Fax - [place] [address] [place], [place]  [address] Phone - [phone]   Fax - [phone]  [facility]UB[facility] [address] [place], [place]  [address] Phone - [phone]   Fax - [place] [address] [place], [place]  [address] Phone - [phone]   Fax - [phone]  [place] [address] [place], [place]  [address] Phone - [phone]   Fax - [phone] [place] [address] [place], [place]  [address] Phone - [phone]   Fax - [place] [address] [place], [place]  [address] Phone - [phone]   Fax - [place] [address], [place],   [address] Phone - [phone]   Fax - [phone]  Aline MD [address] [place] [address] Phone [phone]  Fax [phone]

## 2018-10-04 NOTE — Progress Notes (Signed)
Pt states is having swelling in Vagina since she left hospital on last Wednesday.

## 2018-10-04 NOTE — BH Specialist Note (Signed)
Integrated Behavioral Health Initial Visit  MRN: [id number] Name: Cathy [name]  Number of [place] Clinician visits:: 1/6 Session Start time: 11:42 Session End time: 11:51 Total time: 15 minutes  Type of Service: [place] Interpretor:No. Interpretor Name and Language: n/a   Warm Hand Off Completed.       SUBJECTIVE: Cathy [name] is a 37 y.o. female accompanied by n/a Patient was referred by Loma Boston, MD for Initial OB introduction to integrated behavioral health services . Patient reports the following symptoms/concerns: Pt states no particular concerns today.  Duration of problem: n/a; Severity of problem: n/a  OBJECTIVE: Mood: Normal and Affect: Appropriate Risk of harm to self or others: No plan to harm self or others  LIFE CONTEXT: Family and Social: Pt lives with FOB and 4 sons (16,11,9,7) School/Work: - Self-Care: - Life Changes: Current pregnancy  GOALS ADDRESSED: Patient will: 1. Increase knowledge and/or ability of: healthy habits   INTERVENTIONS: Standardized Assessments completed: GAD-7 and PHQ 9  ASSESSMENT: Patient currently experiencing Supervision of high risk pregnancy   Patient may benefit from Initial OB introduction to integrated behavioral health services .  PLAN: 1. Follow up with behavioral health clinician on : As needed 2. Behavioral recommendations:  -Continue taking prenatal vitamin, as recommended by medical provider 3. Referral(s): [place] (In Clinic) 4. "From scale of 1-10, how likely are you to follow plan?": 10  Garlan Fair, LCSW  Depression screen [facility] 2/9 10/04/2018 07/28/2018  Decreased Interest 2 0  Down, Depressed, Hopeless 0 1  PHQ - 2 Score 2 1  Altered sleeping 3 -  Tired, decreased energy 3 -  Change in appetite 0 -  Feeling bad or failure about yourself  0 -  Trouble concentrating 0 -  Moving slowly or  fidgety/restless 0 -  Suicidal thoughts 0 -  PHQ-9 Score 8 -   GAD 7 : Generalized Anxiety Score 10/04/2018  Nervous, Anxious, on Edge 0  Control/stop worrying 1  Worry too much - different things 1  Trouble relaxing 2  Restless 1  Easily annoyed or irritable 2  Afraid - awful might happen 0  Total GAD 7 Score 7

## 2018-10-04 NOTE — Progress Notes (Signed)
Medicaid Home Form completed.

## 2018-10-04 NOTE — Progress Notes (Signed)
Subjective:  Cathy [name] is a [id number] [redacted]w[redacted]d being seen today for her first obstetrical visit.  Her obstetrical history is significant for 4 vaginal deliveries. No complications. Recently diagnosed with Lupus (in August), which was when she found out she was pregnant. Recently released from hospital - was hospitalized with lupus nephritis. Had some elevated BP the last few days she was there. Currently on prednisone. Has appt with rheumatology.. Patient does intend to breast feed. Pregnancy history fully reviewed.  Patient reports swelling in legs and mons.  BP (!) 159/96   Pulse (!) 112   Wt 178 lb 14.4 oz (81.1 kg)   LMP 06/22/2018   BMI 28.02 kg/m   HISTORY: OB History  Gravida Para Term Preterm AB Living  6 4 4  0 1 4  SAB TAB Ectopic Multiple Live Births  1 0 0 0 4    # Outcome Date GA Lbr Len/2nd Weight Sex Delivery Anes PTL Lv  6 Current           5 Term 09/24/11 [redacted]w[redacted]d  6 lb 12.6 oz (3.079 kg) M Vag-Spont EPI  LIV     Birth Comments: WNL  4 Term 03/09/09 [redacted]w[redacted]d  7 lb 6 oz (3.345 kg)  Vag-Spont   LIV  3 Term 08/04/07 [redacted]w[redacted]d  8 lb 6 oz (3.799 kg) M Vag-Spont   LIV  2 Term 01/02/03 [redacted]w[redacted]d  6 lb 14 oz (3.118 kg)  Vag-Spont   LIV  1 SAB             Past Medical History:  Diagnosis Date  . Anxiety   . Asthma   . Bipolar depression ([place])   . Depression   . Gonorrhea   . Lupus ([place])   . Pelvic inflammatory disease (PID)   . Trichomonas infection     Past Surgical History:  Procedure Laterality Date  . NO PAST SURGERIES    . RENAL BIOPSY      Family History  Problem Relation Age of Onset  . Diabetes Maternal Grandmother   . Hypertension Maternal Grandmother   . Diabetes Maternal Grandfather   . Hypertension Maternal Grandfather   . Diabetes Paternal Grandmother   . Hypertension Paternal Grandmother   . Diabetes Paternal Grandfather   . Hypertension Paternal Grandfather      Exam  BP (!) 159/96   Pulse (!) 112   Wt 178 lb 14.4 oz (81.1 kg)   LMP  06/22/2018   BMI 28.02 kg/m   CONSTITUTIONAL: Well-developed, well-nourished female in no acute distress.  HENT:  Normocephalic, atraumatic, External right and left ear normal. Oropharynx is clear and moist EYES: Conjunctivae and EOM are normal. Pupils are equal, round, and reactive to light. No scleral icterus.  NECK: Normal range of motion, supple, no masses.  Normal thyroid.  CARDIOVASCULAR: Normal heart rate noted, regular rhythm RESPIRATORY: Clear to auscultation bilaterally. Effort and breath sounds normal, no problems with respiration noted. BREASTS: Symmetric in size. No masses, skin changes, nipple drainage, or lymphadenopathy. ABDOMEN: Soft, normal bowel sounds, no distention noted.  No tenderness, rebound or guarding.  PELVIC: Normal appearing external genitalia; normal appearing vaginal mucosa and cervix. No abnormal discharge noted. Normal uterine size, no other palpable masses, no uterine or adnexal tenderness. MUSCULOSKELETAL: Normal range of motion. No tenderness.  No cyanosis, clubbing, or edema.  2+ distal pulses. SKIN: Skin is warm and dry. No rash noted. Not diaphoretic. No erythema. No pallor. NEUROLOGIC: Alert and oriented to person, place, and time. Normal  reflexes, muscle tone coordination. No cranial nerve deficit noted. PSYCHIATRIC: Normal mood and affect. Normal behavior. Normal judgment and thought content.    Assessment:    Pregnancy: [id number] Patient Active Problem List   Diagnosis Date Noted  . Supervision of high risk pregnancy, antepartum 10/04/2018  . Lupus ([place]) 09/20/2018  . Supervision of pregnancy with grand multiparity in second trimester 09/20/2018  . Substance abuse ([place]) 09/20/2018  . Acute renal failure (ARF) ([place]) 09/20/2018  . Chest pain 09/20/2018  . Dehydration 09/20/2018  . MDD (major depressive disorder) 10/11/2015  . Asthma 09/22/2011      Plan:   1. Supervision of high risk pregnancy, antepartum FHT normal - CHL AMB BABYSCRIPTS  OPT IN - Culture, OB Urine - Cystic fibrosis gene test - Genetic Screening - Hemoglobinopathy Evaluation - Obstetric Panel, Including HIV - SMN1 COPY NUMBER ANALYSIS (SMA Carrier Screen) - [place] MFM OB DETAIL +14 WK; Future - Protein / creatinine ratio, urine - Comprehensive metabolic panel  2. Acute renal failure, unspecified acute renal failure type ([place]) CMP today.  3. Lupus ([place]) On prednisone and imuran MFM consult  4. Anemia during pregnancy in second trimester CBC today.  5. Hypertension during pregnancy in second trimester, unspecified hypertension in pregnancy type BP mild range. Will watch closely Start ASA 81mg  CMP and UP:C today.     Problem list reviewed and updated. 75% of 45 min visit spent on counseling and coordination of care.     Truett Mainland 10/04/2018

## 2018-10-05 ENCOUNTER — Encounter (HOSPITAL_COMMUNITY): Payer: Self-pay

## 2018-10-06 LAB — URINE CULTURE, OB REFLEX: Organism ID, Bacteria: NO GROWTH

## 2018-10-06 LAB — CULTURE, OB URINE

## 2018-10-07 ENCOUNTER — Encounter: Payer: Self-pay | Admitting: *Deleted

## 2018-10-11 LAB — COMPREHENSIVE METABOLIC PANEL
ALK PHOS: 55 IU/L (ref 39–117)
ALT: 20 IU/L (ref 0–32)
AST: 17 IU/L (ref 0–40)
Albumin/Globulin Ratio: 1 — ABNORMAL LOW (ref 1.2–2.2)
Albumin: 3 g/dL — ABNORMAL LOW (ref 3.5–5.5)
BUN/Creatinine Ratio: 14 (ref 9–23)
BUN: 21 mg/dL — AB (ref 6–20)
CHLORIDE: 110 mmol/L — AB (ref 96–106)
CO2: 18 mmol/L — AB (ref 20–29)
Calcium: 8.3 mg/dL — ABNORMAL LOW (ref 8.7–10.2)
Creatinine, Ser: 1.53 mg/dL — ABNORMAL HIGH (ref 0.57–1.00)
GFR calc Af Amer: 51 mL/min/{1.73_m2} — ABNORMAL LOW (ref >59)
GFR calc non Af Amer: 44 mL/min/{1.73_m2} — ABNORMAL LOW (ref >59)
GLOBULIN, TOTAL: 3 g/dL (ref 1.5–4.5)
GLUCOSE: 87 mg/dL (ref 65–99)
Potassium: 4.1 mmol/L (ref 3.5–5.2)
SODIUM: 139 mmol/L (ref 134–144)
Total Protein: 6 g/dL (ref 6.0–8.5)

## 2018-10-11 LAB — SMN1 COPY NUMBER ANALYSIS (SMA CARRIER SCREENING)

## 2018-10-11 LAB — OBSTETRIC PANEL, INCLUDING HIV
BASOS ABS: 0 10*3/uL (ref 0.0–0.2)
BASOS: 0 %
EOS (ABSOLUTE): 0 10*3/uL (ref 0.0–0.4)
Eos: 0 %
HEMATOCRIT: 20.3 % — AB (ref 34.0–46.6)
HEP B S AG: NEGATIVE
HIV SCREEN 4TH GENERATION: NONREACTIVE
Hemoglobin: 6.9 g/dL — CL (ref 11.1–15.9)
Immature Grans (Abs): 0.2 10*3/uL — ABNORMAL HIGH (ref 0.0–0.1)
Immature Granulocytes: 2 %
LYMPHS ABS: 0.7 10*3/uL (ref 0.7–3.1)
Lymphs: 6 %
MCH: 29.6 pg (ref 26.6–33.0)
MCHC: 34 g/dL (ref 31.5–35.7)
MCV: 87 fL (ref 79–97)
Monocytes Absolute: 0.7 10*3/uL (ref 0.1–0.9)
Monocytes: 6 %
Neutrophils Absolute: 11.1 10*3/uL — ABNORMAL HIGH (ref 1.4–7.0)
Neutrophils: 86 %
PLATELETS: 245 10*3/uL (ref 150–450)
RBC: 2.33 x10E6/uL — CL (ref 3.77–5.28)
RDW: 12.7 % (ref 12.3–15.4)
RPR: NONREACTIVE
Rh Factor: POSITIVE
Rubella Antibodies, IGG: 1.85 index (ref >0.99)
WBC: 12.8 10*3/uL — AB (ref 3.4–10.8)

## 2018-10-11 LAB — PROTEIN / CREATININE RATIO, URINE
CREATININE, UR: 72.7 mg/dL
PROTEIN UR: 139.9 mg/dL
Protein/Creat Ratio: 1924 mg/g creat — ABNORMAL HIGH (ref 0–200)

## 2018-10-11 LAB — CYSTIC FIBROSIS GENE TEST

## 2018-10-11 LAB — HEMOGLOBINOPATHY EVALUATION
Ferritin: 405 ng/mL — ABNORMAL HIGH (ref 15–150)
HGB A2 QUANT: 2.7 % (ref 1.8–3.2)
HGB C: 0 %
HGB F QUANT: 0 % (ref 0.0–2.0)
Hgb A: 97.3 % (ref 96.4–98.8)
Hgb S: 0 %
Hgb Solubility: NEGATIVE
Hgb Variant: 0 %

## 2018-10-11 LAB — AB SCR+ANTIBODY ID

## 2018-10-12 ENCOUNTER — Encounter (HOSPITAL_COMMUNITY): Payer: Self-pay

## 2018-10-12 ENCOUNTER — Other Ambulatory Visit: Payer: Self-pay

## 2018-10-12 ENCOUNTER — Inpatient Hospital Stay (HOSPITAL_COMMUNITY)
Admission: AD | Admit: 2018-10-12 | Discharge: 2018-10-12 | Disposition: A | Discharge status: Home / Self Care | Payer: Medicaid Other | Attending: Family Medicine | Admitting: Family Medicine

## 2018-10-12 ENCOUNTER — Other Ambulatory Visit: Payer: Self-pay | Admitting: Family Medicine

## 2018-10-12 ENCOUNTER — Observation Stay (HOSPITAL_COMMUNITY)
Admission: AD | Admit: 2018-10-12 | Discharge: 2018-10-12 | Disposition: A | Discharge status: Home / Self Care | Payer: Medicaid Other | Source: Ambulatory Visit | Attending: Family Medicine | Admitting: Family Medicine

## 2018-10-12 ENCOUNTER — Ambulatory Visit (HOSPITAL_BASED_OUTPATIENT_CLINIC_OR_DEPARTMENT_OTHER)
Admission: RE | Admit: 2018-10-12 | Discharge: 2018-10-12 | Disposition: A | Discharge status: Home / Self Care | Payer: Medicaid Other | Source: Ambulatory Visit | Attending: Family Medicine | Admitting: Family Medicine

## 2018-10-12 DIAGNOSIS — O0990 Supervision of high risk pregnancy, unspecified, unspecified trimester: Secondary | ICD-10-CM

## 2018-10-12 DIAGNOSIS — Z3A19 19 weeks gestation of pregnancy: Secondary | ICD-10-CM

## 2018-10-12 DIAGNOSIS — R0602 Shortness of breath: Secondary | ICD-10-CM | POA: Diagnosis not present

## 2018-10-12 DIAGNOSIS — O09522 Supervision of elderly multigravida, second trimester: Secondary | ICD-10-CM | POA: Insufficient documentation

## 2018-10-12 DIAGNOSIS — Z363 Encounter for antenatal screening for malformations: Secondary | ICD-10-CM

## 2018-10-12 DIAGNOSIS — F17210 Nicotine dependence, cigarettes, uncomplicated: Secondary | ICD-10-CM | POA: Insufficient documentation

## 2018-10-12 DIAGNOSIS — O26892 Other specified pregnancy related conditions, second trimester: Secondary | ICD-10-CM | POA: Insufficient documentation

## 2018-10-12 DIAGNOSIS — M329 Systemic lupus erythematosus, unspecified: Secondary | ICD-10-CM | POA: Insufficient documentation

## 2018-10-12 DIAGNOSIS — O0942 Supervision of pregnancy with grand multiparity, second trimester: Secondary | ICD-10-CM

## 2018-10-12 DIAGNOSIS — M3212 Pericarditis in systemic lupus erythematosus: Secondary | ICD-10-CM | POA: Diagnosis present

## 2018-10-12 DIAGNOSIS — D649 Anemia, unspecified: Secondary | ICD-10-CM | POA: Diagnosis not present

## 2018-10-12 DIAGNOSIS — J45909 Unspecified asthma, uncomplicated: Secondary | ICD-10-CM | POA: Diagnosis not present

## 2018-10-12 DIAGNOSIS — O139 Gestational [pregnancy-induced] hypertension without significant proteinuria, unspecified trimester: Secondary | ICD-10-CM

## 2018-10-12 LAB — CBC
HCT: 21.3 % — ABNORMAL LOW (ref 36.0–46.0)
Hemoglobin: 7.2 g/dL — ABNORMAL LOW (ref 12.0–15.0)
MCH: 30.6 pg (ref 26.0–34.0)
MCHC: 33.8 g/dL (ref 30.0–36.0)
MCV: 90.6 fL (ref 80.0–100.0)
PLATELETS: 206 10*3/uL (ref 150–400)
RBC: 2.35 MIL/uL — ABNORMAL LOW (ref 3.87–5.11)
RDW: 14.3 % (ref 11.5–15.5)
WBC: 8.5 10*3/uL (ref 4.0–10.5)
nRBC: 0 % (ref 0.0–0.2)

## 2018-10-12 LAB — COMPREHENSIVE METABOLIC PANEL
ALK PHOS: 69 U/L (ref 38–126)
ALT: 30 U/L (ref 0–44)
AST: 25 U/L (ref 15–41)
Albumin: 2.9 g/dL — ABNORMAL LOW (ref 3.5–5.0)
Anion gap: 6 (ref 5–15)
BILIRUBIN TOTAL: 0.2 mg/dL — AB (ref 0.3–1.2)
BUN: 30 mg/dL — AB (ref 6–20)
CALCIUM: 8.3 mg/dL — AB (ref 8.9–10.3)
CO2: 21 mmol/L — ABNORMAL LOW (ref 22–32)
Chloride: 107 mmol/L (ref 98–111)
Creatinine, Ser: 1.56 mg/dL — ABNORMAL HIGH (ref 0.44–1.00)
GFR calc Af Amer: 49 mL/min — ABNORMAL LOW (ref >60)
GFR, EST NON AFRICAN AMERICAN: 42 mL/min — AB (ref >60)
GLUCOSE: 114 mg/dL — AB (ref 70–99)
Potassium: 4.7 mmol/L (ref 3.5–5.1)
Sodium: 134 mmol/L — ABNORMAL LOW (ref 135–145)
TOTAL PROTEIN: 6.3 g/dL — AB (ref 6.5–8.1)

## 2018-10-12 LAB — URINALYSIS, ROUTINE W REFLEX MICROSCOPIC
Bilirubin Urine: NEGATIVE
GLUCOSE, UA: NEGATIVE mg/dL
Ketones, ur: NEGATIVE mg/dL
Leukocytes, UA: NEGATIVE
NITRITE: NEGATIVE
PH: 7 (ref 5.0–8.0)
Protein, ur: 100 mg/dL — AB
SPECIFIC GRAVITY, URINE: 1.011 (ref 1.005–1.030)

## 2018-10-12 LAB — PREPARE RBC (CROSSMATCH)

## 2018-10-12 MED ORDER — CYANOCOBALAMIN 1000 MCG/ML IJ SOLN: INTRAMUSCULAR | 0 refills | Status: DC

## 2018-10-12 MED ORDER — CYANOCOBALAMIN 1000 MCG/ML IJ SOLN
1000.0000 ug | Freq: Once | INTRAMUSCULAR | Status: AC
  Administered 2018-10-12: 1000 ug via INTRAMUSCULAR
  Filled 2018-10-12: qty 1

## 2018-10-12 MED ORDER — ACETAMINOPHEN 325 MG PO TABS
650.0000 mg | ORAL_TABLET | Freq: Once | ORAL | Status: AC
  Administered 2018-10-12: 650 mg via ORAL
  Filled 2018-10-12: qty 2

## 2018-10-12 NOTE — Progress Notes (Signed)
Increased rate of blood from 150 to 175 per Dr. Gwenyth Bouillon, RN

## 2018-10-12 NOTE — MAU Note (Addendum)
Pt states that she was getting u/s downstairs and they told her she has high blood pressure.   Pt also states she has had SOB for the last 2 weeks.

## 2018-10-12 NOTE — H&P (Signed)
ANTEPARTUM ADMISSION HISTORY AND PHYSICAL  Cathy [name] is a 37 y.o. female [phone] with at 19w0 who presented to the MAU from ultrasound because of elevated blood pressures. Here pregnancy has been complicated by recent diagnosis of lupus with hospitalization for renal failure secondary to lupus nephritis. States she went to for anatomy ultrasound today and blood pressure was elevated. States blood pressure has been elevated since leaving the hospital about 2 weeks ago. Has been feeling "okay" since discharge but has been more short of breath than usual and just feels very tired. Reports feeling like she has gained lots of weight in fluid - face feels puffy, legs are heavy. Isn't able to dance with son as much anymore because of fatigue and shortness of breath. Denies any headaches, abdominal pain, blurry vision. Taking medications as prescribed but hasn't taken anymore B12.   Denies vaginal bleeding, leakage of fluids. Reports more normal bowel movements now. Diagnosis of lupus prompted because of new-onset of rash, yellow diarrhea, swelling.   Recently established prenatal care with [facility].   Past Medical History: Past Medical History:  Diagnosis Date  . Anxiety   . Asthma   . Bipolar depression ([place])   . Depression   . Gonorrhea   . Lupus ([place])   . Pelvic inflammatory disease (PID)   . Trichomonas infection     Past Surgical History: Past Surgical History:  Procedure Laterality Date  . NO PAST SURGERIES    . RENAL BIOPSY      Obstetrical History: OB History    Gravida  6   Para  4   Term  4   Preterm  0   AB  1   Living  4     SAB  1   TAB  0   Ectopic  0   Multiple  0   Live Births  4           Social History: Social History   Socioeconomic History  . Marital status: Single    Spouse name: Not on file  . Number of children: Not on file  . Years of education: Not on file  . Highest education level: Not on file  Occupational History  . Not  on file  Social Needs  . Financial resource strain: Not on file  . Food insecurity:    Worry: Not on file    Inability: Not on file  . Transportation needs:    Medical: Not on file    Non-medical: Not on file  Tobacco Use  . Smoking status: Current Some Day Smoker    Packs/day: 0.25    Types: Cigarettes  . Smokeless tobacco: Never Used  Substance and Sexual Activity  . Alcohol use: Not Currently    Frequency: Never  . Drug use: Yes    Types: Marijuana    Comment: 3 times a week   . Sexual activity: Not Currently    Birth control/protection: None  Lifestyle  . Physical activity:    Days per week: Not on file    Minutes per session: Not on file  . Stress: Not on file  Relationships  . Social connections:    Talks on phone: Not on file    Gets together: Not on file    Attends religious service: Not on file    Active member of club or organization: Not on file    Attends meetings of clubs or organizations: Not on file    Relationship status: Not  on file  Other Topics Concern  . Not on file  Social History Narrative  . Not on file    Family History: Family History  Problem Relation Age of Onset  . Diabetes Maternal Grandmother   . Hypertension Maternal Grandmother   . Diabetes Maternal Grandfather   . Hypertension Maternal Grandfather   . Diabetes Paternal Grandmother   . Hypertension Paternal Grandmother   . Diabetes Paternal Grandfather   . Hypertension Paternal Grandfather     Allergies: No Known Allergies  Medications Prior to Admission  Medication Sig Dispense Refill Last Dose  . aspirin EC 81 MG tablet Take 1 tablet (81 mg total) by mouth daily. Take after 12 weeks for prevention of preeclampsia later in pregnancy 300 tablet 2 Taking  . azaTHIOprine (IMURAN) 50 MG tablet Take 1 tablet (50 mg total) by mouth 2 (two) times daily. 60 tablet 0 Taking  . cyanocobalamin (,VITAMIN B-12,) 1000 MCG/ML injection Inject 1 mL (1,000 mcg total) into the skin every 30  (thirty) days. 30 mL 0 Taking  . famotidine (PEPCID) 20 MG tablet Take 1 tablet (20 mg total) by mouth 2 (two) times daily. 30 tablet 0 Taking  . folic acid (FOLVITE) 1 MG tablet Take 1 tablet (1 mg total) by mouth daily. 30 tablet 0 Taking  . predniSONE (DELTASONE) 20 MG tablet Take 2 tablets (40 mg total) by mouth daily with breakfast. 60 tablet 0 Taking  . Prenatal Vit-Fe Fumarate-FA (PRENATAL MULTIVITAMIN) TABS tablet Take 1 tablet by mouth daily at 12 noon. 30 tablet 0 Taking  . sodium bicarbonate 650 MG tablet Take 1 tablet (650 mg total) by mouth 2 (two) times daily. 60 tablet 0 Taking    Review of Systems  All systems reviewed and negative except as stated in HPI  Blood pressure (!) 157/106, pulse 79, temperature 99.7 F (37.6 C), temperature source Oral, resp. rate 18, height 5\' 7"  (1.702 m), weight 76.2 kg, last menstrual period 06/22/2018, SpO2 100 %. General appearance: well-appearing, NAD, hyperpigmented malar rash Lungs: no respiratory distress, LTAB, no wheezing or crackles  Heart: regular rate and rhythm  Abdomen: soft, non-tender; gravid and distended  fundus difficult to palpate because of generalized abdominal distension Pelvic: deferred Extremities:  2+ bilateral pitting edema to mid-thigh  Fetal monitoring: Doppler 158   Prenatal labs: ABO, Rh: --/--/O POS (11/12 1619) Antibody: NEG (11/12 1619) Rubella: 1.85 (11/04 1131) RPR: Non Reactive (11/04 1131)  HBsAg: Negative (11/04 1131)  HIV: Non Reactive (11/04 1131)   Results for orders placed or performed during the hospital encounter of 10/12/18 (from the past 24 hour(s))  Urinalysis, Routine w reflex microscopic   Collection Time: 10/12/18  3:09 PM  Result Value Ref Range   Color, Urine YELLOW YELLOW   APPearance CLEAR CLEAR   Specific Gravity, Urine 1.011 1.005 - 1.030   pH 7.0 5.0 - 8.0   Glucose, UA NEGATIVE NEGATIVE mg/dL   Hgb urine dipstick MODERATE (A) NEGATIVE   Bilirubin Urine NEGATIVE NEGATIVE    Ketones, ur NEGATIVE NEGATIVE mg/dL   Protein, ur 100 (A) NEGATIVE mg/dL   Nitrite NEGATIVE NEGATIVE   Leukocytes, UA NEGATIVE NEGATIVE   RBC / HPF 21-50 0 - 5 RBC/hpf   WBC, UA 0-5 0 - 5 WBC/hpf   Bacteria, UA RARE (A) NONE SEEN   Squamous Epithelial / LPF 0-5 0 - 5  Comprehensive metabolic panel   Collection Time: 10/12/18  3:36 PM  Result Value Ref Range   Sodium 134 (  L) 135 - 145 mmol/L   Potassium 4.7 3.5 - 5.1 mmol/L   Chloride 107 98 - 111 mmol/L   CO2 21 (L) 22 - 32 mmol/L   Glucose, Bld 114 (H) 70 - 99 mg/dL   BUN 30 (H) 6 - 20 mg/dL   Creatinine, Ser 1.56 (H) 0.44 - 1.00 mg/dL   Calcium 8.3 (L) 8.9 - 10.3 mg/dL   Total Protein 6.3 (L) 6.5 - 8.1 g/dL   Albumin 2.9 (L) 3.5 - 5.0 g/dL   AST 25 15 - 41 U/L   ALT 30 0 - 44 U/L   Alkaline Phosphatase 69 38 - 126 U/L   Total Bilirubin 0.2 (L) 0.3 - 1.2 mg/dL   GFR calc non Af Amer 42 (L) >60 mL/min   GFR calc Af Amer 49 (L) >60 mL/min   Anion gap 6 5 - 15  CBC   Collection Time: 10/12/18  3:36 PM  Result Value Ref Range   WBC 8.5 4.0 - 10.5 K/uL   RBC 2.35 (L) 3.87 - 5.11 MIL/uL   Hemoglobin 7.2 (L) 12.0 - 15.0 g/dL   HCT 21.3 (L) 36.0 - 46.0 %   MCV 90.6 80.0 - 100.0 fL   MCH 30.6 26.0 - 34.0 pg   MCHC 33.8 30.0 - 36.0 g/dL   RDW 14.3 11.5 - 15.5 %   Platelets 206 150 - 400 K/uL   nRBC 0.0 0.0 - 0.2 %  Type and screen [place]   Collection Time: 10/12/18  4:19 PM  Result Value Ref Range   ABO/RH(D) O POS    Antibody Screen NEG    Sample Expiration      10/15/2018 Performed at [facility], [address] [place], [place] [address]   Prepare [facility]   Collection Time: 10/12/18  4:19 PM  Result Value Ref Range   Order Confirmation      ORDER PROCESSED BY BLOOD BANK Performed at [facility], [address] [place], [place] [address]     Patient Active Problem List   Diagnosis Date Noted  . Anemia 10/12/2018  . Supervision of high risk pregnancy, antepartum 10/04/2018  .  Lupus ([place]) 09/20/2018  . Supervision of pregnancy with grand multiparity in second trimester 09/20/2018  . Substance abuse ([place]) 09/20/2018  . Acute renal failure (ARF) ([place]) 09/20/2018  . Chest pain 09/20/2018  . Dehydration 09/20/2018  . MDD (major depressive disorder) 10/11/2015  . Asthma 09/22/2011    Assessment/Plan:  CHIA MOWERS is a 37 y.o. G8T1572 at [redacted]w[redacted]d here for elevated blood pressures and shortness of breath in setting of recent diagnosis of lupus complicated by nephritis and renal failure. Presented to MAU and found to be persistently anemic and hypertensive. Transferred to antepartum floor for observation and administration of 1U pRBCs.  Shortness of Breath  Normocytic Anemia  B12 Deficiency, Chronic Disease: Likely related to combination of B12 deficiency, anemia of chronic disease, decrease EPO activity with recent renal failure from lupus nephritis. Iron levels wnl 2 weeks ago. Received pRBCS during hospitalization 2 weeks ago. Given HgB<8 and symptomatic, will transfuse pRBCs and administer B12 injection. Lungs sound normal on exam, no evidence for pulmonary edema.  -- Type and Screen -- transfuse 1U pRBC -- B12 IM once -- Rx sent to pharmacy for weekly B12 injections in clinic  -- continue prenatal vitamins    Hypertension: Mixed etiology -  Will classify as chronic given < 20 weeks and in setting of lupus nephritis.  No severe range blood pressures.  BP Readings from Last 3 Encounters:  10/12/18 (!) 135/94  10/12/18 (!) 148/104  10/04/18 (!) 159/96  -- defer treatment at this time -- MFM consult outpatient -- keep follow-up with rheum/nephrology -- consider initiation of anti-hypertensive at next outpatient visit   Pitting Edema  Lupus  Lupus Nephritis  Recent Renal Failure: Stable. Cr essentially unchanged from last check at 1.56. No electrolyte abnormalities today.  -- continue aspirin, Imuran, prednisone, bicarb outpatient  -- avoid nephrotoxic  medications -- keep follow-up with rheum/nephrology  Laurel S. Juleen China, DO OB/GYN Fellow

## 2018-10-12 NOTE — Discharge Instructions (Signed)
·   Pick up your B12 vials at the pharmacy and bring to your next prenatal appointment  If you have worsening shortness of breath, please return to clinic or the MAU  Keep your scheduled appointments with your lupus/kidney doctors

## 2018-10-13 LAB — TYPE AND SCREEN
ABO/RH(D): O POS
Antibody Screen: NEGATIVE
DONOR AG TYPE: NEGATIVE
Unit division: 0

## 2018-10-13 LAB — BPAM RBC
Blood Product Expiration Date: 201911262359
ISSUE DATE / TIME: 201911121930
Unit Type and Rh: 5100

## 2018-10-13 NOTE — Discharge Summary (Signed)
Antepartum Discharge Summary OB/GYN Faculty Practice   Patient Name: Cathy [name] DOB: November 07, 1981 MRN: [id number]  Date of admission: 10/12/2018 Date of discharge: 10/12/18  Admitting diagnosis: 18wks high bp Intrauterine pregnancy: [redacted]w[redacted]d     Secondary diagnosis:   Active Problems:   Lupus ([place])   Supervision of high risk pregnancy, antepartum   Anemia  Hospital course:Cathy [name] is a 37 y.o. [id number] at [redacted]w[redacted]d here for elevated blood pressures and shortness of breath in setting of recent diagnosis of lupus complicated by nephritis and renal failure. Presented to MAU and found to be persistently anemic and hypertensive. Transferred to antepartum floor for observation and administration of 1U pRBCs. See H&P from same date for additional details of admission. Received pRBCs without complication and was discharged home with plans to follow-up in clinic in 1 week.   Physical exam  Vitals:   10/12/18 1942 10/12/18 2000 10/12/18 2015 10/12/18 2153  BP: (!) 135/105 (!) 164/111 (!) 142/102 (!) 135/94  Pulse: 77 73 73 79  Resp: 18 20 18 18   Temp: 98.7 F (37.1 C) 98.6 F (37 C)  98.7 F (37.1 C)  TempSrc: Oral Oral  Oral  SpO2: 99% 100%  100%  Weight:      Height:      *physicial exam noted in H&P from same day  Labs: Lab Results  Component Value Date   WBC 8.5 10/12/2018   HGB 7.2 (L) 10/12/2018   HCT 21.3 (L) 10/12/2018   MCV 90.6 10/12/2018   PLT 206 10/12/2018   CMP Latest Ref Rng & Units 10/12/2018  Glucose 70 - 99 mg/dL 114(H)  BUN 6 - 20 mg/dL 30(H)  Creatinine 0.44 - 1.00 mg/dL 1.56(H)  Sodium 135 - 145 mmol/L 134(L)  Potassium 3.5 - 5.1 mmol/L 4.7  Chloride 98 - 111 mmol/L 107  CO2 22 - 32 mmol/L 21(L)  Calcium 8.9 - 10.3 mg/dL 8.3(L)  Total Protein 6.5 - 8.1 g/dL 6.3(L)  Total Bilirubin 0.3 - 1.2 mg/dL 0.2(L)  Alkaline Phos 38 - 126 U/L 69  AST 15 - 41 U/L 25  ALT 0 - 44 U/L 30    After visit meds:  Allergies as of 10/12/2018   No Known  Allergies     Medication List    TAKE these medications   aspirin EC 81 MG tablet Take 1 tablet (81 mg total) by mouth daily. Take after 12 weeks for prevention of preeclampsia later in pregnancy   azaTHIOprine 50 MG tablet Commonly known as:  IMURAN Take 1 tablet (50 mg total) by mouth 2 (two) times daily.   cyanocobalamin 1000 MCG/ML injection Commonly known as:  (VITAMIN B-12) Inject weekly. What changed:    how much to take  how to take this  when to take this  additional instructions   famotidine 20 MG tablet Commonly known as:  PEPCID Take 1 tablet (20 mg total) by mouth 2 (two) times daily.   folic acid 1 MG tablet Commonly known as:  FOLVITE Take 1 tablet (1 mg total) by mouth daily.   predniSONE 20 MG tablet Commonly known as:  DELTASONE Take 2 tablets (40 mg total) by mouth daily with breakfast.   prenatal multivitamin Tabs tablet Take 1 tablet by mouth daily at 12 noon.   sodium bicarbonate 650 MG tablet Take 1 tablet (650 mg total) by mouth 2 (two) times daily.       Follow-up Appt: Future Appointments  Date Time Provider [place]  10/18/2018  3:15 PM Truett Mainland, DO [facility] [place]  10/22/2018  1:40 PM Lanae Boast, [place] [facility] None  01/28/2019 10:00 AM Lanae Boast, FNP [facility] None   Lambert Mody. Juleen China, DO OB/GYN Fellow, Faculty Practice

## 2018-10-18 ENCOUNTER — Encounter: Payer: Self-pay | Admitting: *Deleted

## 2018-10-18 ENCOUNTER — Ambulatory Visit (INDEPENDENT_AMBULATORY_CARE_PROVIDER_SITE_OTHER): Payer: Medicaid Other | Admitting: Family Medicine

## 2018-10-18 VITALS — BP 146/97 | HR 93 | Wt 167.2 lb

## 2018-10-18 DIAGNOSIS — O162 Unspecified maternal hypertension, second trimester: Secondary | ICD-10-CM

## 2018-10-18 DIAGNOSIS — M329 Systemic lupus erythematosus, unspecified: Secondary | ICD-10-CM

## 2018-10-18 DIAGNOSIS — N179 Acute kidney failure, unspecified: Secondary | ICD-10-CM

## 2018-10-18 DIAGNOSIS — O0992 Supervision of high risk pregnancy, unspecified, second trimester: Secondary | ICD-10-CM

## 2018-10-18 DIAGNOSIS — O99012 Anemia complicating pregnancy, second trimester: Secondary | ICD-10-CM

## 2018-10-18 DIAGNOSIS — O0990 Supervision of high risk pregnancy, unspecified, unspecified trimester: Secondary | ICD-10-CM

## 2018-10-18 DIAGNOSIS — Z3A19 19 weeks gestation of pregnancy: Secondary | ICD-10-CM

## 2018-10-18 LAB — POCT URINALYSIS DIP (DEVICE)
Bilirubin Urine: NEGATIVE
GLUCOSE, UA: NEGATIVE mg/dL
Ketones, ur: NEGATIVE mg/dL
LEUKOCYTES UA: NEGATIVE
NITRITE: NEGATIVE
PH: 7 (ref 5.0–8.0)
Protein, ur: >=300 mg/dL — AB
Specific Gravity, Urine: 1.025 (ref 1.005–1.030)
Urobilinogen, UA: 0.2 mg/dL (ref 0.0–1.0)

## 2018-10-18 MED ORDER — FAMOTIDINE 20 MG PO TABS: 20.0000 mg | ORAL_TABLET | Freq: Two times a day (BID) | ORAL | 6 refills | Status: DC

## 2018-10-18 NOTE — Progress Notes (Signed)
   PRENATAL VISIT NOTE  Subjective:  Cathy [name] is a 37 y.o. [id number] at [redacted]w[redacted]d being seen today for ongoing prenatal care.  She is currently monitored for the following issues for this high-risk pregnancy and has Asthma; MDD (major depressive disorder); Lupus ([place]); Supervision of pregnancy with grand multiparity in second trimester; Substance abuse ([place]); Acute renal failure (ARF) ([place]); Chest pain; Dehydration; Supervision of high risk pregnancy, antepartum; and Anemia on their problem list.  Patient reports no complaints. Feels better after blood transfusion.  Contractions: Not present. Vag. Bleeding: None.  Movement: Present. Denies leaking of fluid.   The following portions of the patient's history were reviewed and updated as appropriate: allergies, current medications, past family history, past medical history, past social history, past surgical history and problem list. Problem list updated.  Objective:   Vitals:   10/18/18 1554 10/18/18 1558  BP: (!) 149/101 (!) 146/97  Pulse: 97 93  Weight: 167 lb 3.2 oz (75.8 kg)     Fetal Status: Fetal Heart Rate (bpm): 142   Movement: Present     General:  Alert, oriented and cooperative. Patient is in no acute distress.  Skin: Skin is warm and dry. No rash noted.   Cardiovascular: Normal heart rate noted  Respiratory: Normal respiratory effort, no problems with respiration noted  Abdomen: Soft, gravid, appropriate for gestational age.  Pain/Pressure: Absent     Pelvic: Cervical exam deferred        Extremities: Normal range of motion.  Edema: Moderate pitting, indentation subsides rapidly  Mental Status: Normal mood and affect. Normal behavior. Normal judgment and thought content.   Assessment and Plan:  Pregnancy: [id number] at [redacted]w[redacted]d  1. Supervision of high risk pregnancy, antepartum FHT and FH normal  2. Acute renal failure, unspecified acute renal failure type ([place]) Recheck Cr today.  3. Lupus ([place]) On prednisone. Has f/u  with rheumatology on Wednesday.  4. Anemia during pregnancy in second trimester B12 deficient. B12 prescribed. Patient to pick up and bring here. Needs B12 1060mcg weekly injections x 8 doses. Will recheck B12 level in a month. If improved, can change to monthly.  5. Hypertension during pregnancy in second trimester, unspecified hypertension in pregnancy type BP 140s/90s. Serial [place].   Preterm labor symptoms and general obstetric precautions including but not limited to vaginal bleeding, contractions, leaking of fluid and fetal movement were reviewed in detail with the patient. Please refer to After Visit Summary for other counseling recommendations.  Return in about 4 weeks (around 11/15/2018) for HR OB f/u.  Future Appointments  Date Time Provider [place]  10/22/2018  1:40 PM Lanae Boast, [place] [facility] None  01/28/2019 10:00 AM Lanae Boast, FNP [facility] None    Truett Mainland, DO

## 2018-10-20 ENCOUNTER — Ambulatory Visit: Payer: Self-pay

## 2018-10-20 LAB — COMPREHENSIVE METABOLIC PANEL
A/G RATIO: 1.1 — AB (ref 1.2–2.2)
ALBUMIN: 3.3 g/dL — AB (ref 3.5–5.5)
ALT: 19 IU/L (ref 0–32)
AST: 14 IU/L (ref 0–40)
Alkaline Phosphatase: 67 IU/L (ref 39–117)
BUN / CREAT RATIO: 19 (ref 9–23)
BUN: 26 mg/dL — AB (ref 6–20)
CHLORIDE: 105 mmol/L (ref 96–106)
CO2: 15 mmol/L — ABNORMAL LOW (ref 20–29)
Calcium: 8.8 mg/dL (ref 8.7–10.2)
Creatinine, Ser: 1.35 mg/dL — ABNORMAL HIGH (ref 0.57–1.00)
GFR calc non Af Amer: 51 mL/min/{1.73_m2} — ABNORMAL LOW (ref >59)
GFR, EST AFRICAN AMERICAN: 59 mL/min/{1.73_m2} — AB (ref >59)
Globulin, Total: 3 g/dL (ref 1.5–4.5)
Glucose: 103 mg/dL — ABNORMAL HIGH (ref 65–99)
POTASSIUM: 5.5 mmol/L — AB (ref 3.5–5.2)
Sodium: 137 mmol/L (ref 134–144)
TOTAL PROTEIN: 6.3 g/dL (ref 6.0–8.5)

## 2018-10-20 LAB — AFP, SERUM, OPEN SPINA BIFIDA
AFP MoM: 1.43
AFP Value: 78.7 ng/mL
Gest. Age on Collection Date: 19.6 weeks
MATERNAL AGE AT EDD: 35.3 a
OSBR RISK 1 IN: 6621
TEST RESULTS AFP: NEGATIVE
WEIGHT: 167 [lb_av]

## 2018-10-22 ENCOUNTER — Ambulatory Visit: Payer: Self-pay | Admitting: Family Medicine

## 2018-10-25 NOTE — Congregational Nurse Program (Signed)
Client in today after seeing her case manager . States things are going well. She was seen for for prenatal visit last week and she is having a girl ,[place] is April 7[birth year]2020. Client has Lupus and it is being managed with her monthly check ups. States she is  on no blood pressure medication ,taking prednisone doing well,likes vegetables is anemic ,taking her iron and her prenatal med's she states. No questions for the nurse at this time . Her family is some what supportive ,will spend Thanksgiving day with them ,but plans to live here in [place].  Has a [facility] appointment on 11-02-18 and to  returnfor her check up in December. Not working at the present time . Counseled to make sure if she gets into any difficulty to call her OB/GYN and not to take med's over the counter unless instructed .  52 year old son that lives with her excited about baby sister.Working on housing as she has been at the center for 6 months now. Encouraged to check in with the nurse if she feels a need or has questions. . Very pleasant young woman ,straight forward . Record review of clients history done completed .  To follow as needed.

## 2018-11-08 ENCOUNTER — Ambulatory Visit: Payer: Self-pay | Admitting: Family Medicine

## 2018-11-09 ENCOUNTER — Encounter (HOSPITAL_COMMUNITY): Payer: Self-pay

## 2018-11-09 ENCOUNTER — Inpatient Hospital Stay (HOSPITAL_COMMUNITY)
Admission: AD | Admit: 2018-11-09 | Discharge: 2018-11-09 | Disposition: A | Discharge status: Home / Self Care | Payer: Medicaid Other | Attending: Obstetrics & Gynecology | Admitting: Obstetrics & Gynecology

## 2018-11-09 ENCOUNTER — Other Ambulatory Visit: Payer: Self-pay

## 2018-11-09 DIAGNOSIS — R51 Headache: Secondary | ICD-10-CM | POA: Diagnosis not present

## 2018-11-09 DIAGNOSIS — O2342 Unspecified infection of urinary tract in pregnancy, second trimester: Secondary | ICD-10-CM | POA: Diagnosis not present

## 2018-11-09 DIAGNOSIS — O2340 Unspecified infection of urinary tract in pregnancy, unspecified trimester: Secondary | ICD-10-CM

## 2018-11-09 DIAGNOSIS — O212 Late vomiting of pregnancy: Secondary | ICD-10-CM | POA: Insufficient documentation

## 2018-11-09 DIAGNOSIS — O219 Vomiting of pregnancy, unspecified: Secondary | ICD-10-CM

## 2018-11-09 DIAGNOSIS — Z3A23 23 weeks gestation of pregnancy: Secondary | ICD-10-CM | POA: Insufficient documentation

## 2018-11-09 DIAGNOSIS — O26892 Other specified pregnancy related conditions, second trimester: Secondary | ICD-10-CM

## 2018-11-09 LAB — URINALYSIS, ROUTINE W REFLEX MICROSCOPIC
Bacteria, UA: NONE SEEN
Bilirubin Urine: NEGATIVE
Glucose, UA: NEGATIVE mg/dL
Ketones, ur: NEGATIVE mg/dL
LEUKOCYTES UA: NEGATIVE
Nitrite: NEGATIVE
Protein, ur: 100 mg/dL — AB
SPECIFIC GRAVITY, URINE: 1.014 (ref 1.005–1.030)
pH: 6 (ref 5.0–8.0)

## 2018-11-09 LAB — WET PREP, GENITAL
Clue Cells Wet Prep HPF POC: NONE SEEN
SPERM: NONE SEEN
Trich, Wet Prep: NONE SEEN
YEAST WET PREP: NONE SEEN

## 2018-11-09 MED ORDER — PROMETHAZINE HCL 25 MG/ML IJ SOLN
12.5000 mg | Freq: Once | INTRAMUSCULAR | Status: AC
  Administered 2018-11-09: 12.5 mg via INTRAMUSCULAR
  Filled 2018-11-09: qty 1

## 2018-11-09 MED ORDER — IBUPROFEN 600 MG PO TABS
600.0000 mg | ORAL_TABLET | Freq: Once | ORAL | Status: AC
  Administered 2018-11-09: 600 mg via ORAL
  Filled 2018-11-09: qty 1

## 2018-11-09 MED ORDER — PROMETHAZINE HCL 12.5 MG PO TABS: 12.5000 mg | ORAL_TABLET | Freq: Four times a day (QID) | ORAL | 0 refills | Status: DC | PRN

## 2018-11-09 MED ORDER — CEPHALEXIN 500 MG PO CAPS: 500.0000 mg | ORAL_CAPSULE | Freq: Four times a day (QID) | ORAL | 0 refills | Status: DC

## 2018-11-09 MED ORDER — BUTALBITAL-APAP-CAFFEINE 50-325-40 MG PO TABS: 1.0000 | ORAL_TABLET | Freq: Four times a day (QID) | ORAL | 0 refills | Status: DC | PRN

## 2018-11-09 NOTE — MAU Provider Note (Signed)
History     CSN: [id number]  Arrival date and time: 11/09/18 1047   First Provider Initiated Contact with Patient 11/09/18 1154      Chief Complaint  Patient presents with  . Nausea  . Diarrhea  . Abdominal Pain  . Back Pain   HPI   Cathy [name] is a 37 y.o. female [phone] @ [redacted]w[redacted]d here with abdominal pain and lower back pain. Symptoms started around Thanksgiving. The pain comes and goes. The pain is on both sides of her lower abdomen. The pain worsens when she is on her feet or walking around. No bleeding. Has not tried anything for the pain.   OB History    Gravida  6   Para  4   Term  4   Preterm  0   AB  1   Living  4     SAB  1   TAB  0   Ectopic  0   Multiple  0   Live Births  4           Past Medical History:  Diagnosis Date  . Anxiety   . Asthma   . Bipolar depression ([place])   . Depression   . Gonorrhea   . Lupus ([place])   . Pelvic inflammatory disease (PID)   . Trichomonas infection     Past Surgical History:  Procedure Laterality Date  . NO PAST SURGERIES    . RENAL BIOPSY      Family History  Problem Relation Age of Onset  . Diabetes Maternal Grandmother   . Hypertension Maternal Grandmother   . Diabetes Maternal Grandfather   . Hypertension Maternal Grandfather   . Diabetes Paternal Grandmother   . Hypertension Paternal Grandmother   . Diabetes Paternal Grandfather   . Hypertension Paternal Grandfather     Social History   Tobacco Use  . Smoking status: Current Some Day Smoker    Packs/day: 0.25    Types: Cigarettes  . Smokeless tobacco: Never Used  Substance Use Topics  . Alcohol use: Not Currently    Frequency: Never  . Drug use: Not Currently    Types: Marijuana    Comment: 3 times a week     Allergies: No Known Allergies  Medications Prior to Admission  Medication Sig Dispense Refill Last Dose  . aspirin EC 81 MG tablet Take 1 tablet (81 mg total) by mouth daily. Take after 12 weeks for prevention  of preeclampsia later in pregnancy 300 tablet 2 Taking  . azaTHIOprine (IMURAN) 50 MG tablet Take 1 tablet (50 mg total) by mouth 2 (two) times daily. 60 tablet 0 Taking  . cyanocobalamin (,VITAMIN B-12,) 1000 MCG/ML injection Inject weekly. (Patient not taking: Reported on 10/18/2018) 10 mL 0 Not Taking  . famotidine (PEPCID) 20 MG tablet Take 1 tablet (20 mg total) by mouth 2 (two) times daily. 60 tablet 6   . folic acid (FOLVITE) 1 MG tablet Take 1 tablet (1 mg total) by mouth daily. 30 tablet 0 Taking  . predniSONE (DELTASONE) 20 MG tablet Take 2 tablets (40 mg total) by mouth daily with breakfast. 60 tablet 0 Taking  . Prenatal Vit-Fe Fumarate-FA (PRENATAL MULTIVITAMIN) TABS tablet Take 1 tablet by mouth daily at 12 noon. 30 tablet 0 Taking  . sodium bicarbonate 650 MG tablet Take 1 tablet (650 mg total) by mouth 2 (two) times daily. 60 tablet 0 Taking   Results for orders placed or performed during the hospital encounter of  11/09/18 (from the past 72 hour(s))  GC/Chlamydia probe amp ([place])not at [facility]     Status: None   Collection Time: 11/09/18 12:00 AM  Result Value Ref Range   Chlamydia Negative     Comment: Normal Reference Range - Negative   Neisseria gonorrhea Negative     Comment: Normal Reference Range - Negative  Urinalysis, Routine w reflex microscopic     Status: Abnormal   Collection Time: 11/09/18 11:19 AM  Result Value Ref Range   Color, Urine YELLOW YELLOW   APPearance HAZY (A) CLEAR   Specific Gravity, Urine 1.014 1.005 - 1.030   pH 6.0 5.0 - 8.0   Glucose, UA NEGATIVE NEGATIVE mg/dL   Hgb urine dipstick LARGE (A) NEGATIVE   Bilirubin Urine NEGATIVE NEGATIVE   Ketones, ur NEGATIVE NEGATIVE mg/dL   Protein, ur 100 (A) NEGATIVE mg/dL   Nitrite NEGATIVE NEGATIVE   Leukocytes, UA NEGATIVE NEGATIVE   RBC / HPF 11-20 0 - 5 RBC/hpf   WBC, UA 6-10 0 - 5 WBC/hpf   Bacteria, UA NONE SEEN NONE SEEN   Squamous Epithelial / LPF 0-5 0 - 5   Mucus PRESENT    Hyaline  Casts, UA PRESENT     Comment: Performed at [facility], [address], [place], [place] [address]  Wet prep, genital     Status: Abnormal   Collection Time: 11/09/18 11:58 AM  Result Value Ref Range   Yeast Wet Prep HPF POC NONE SEEN NONE SEEN   Trich, Wet Prep NONE SEEN NONE SEEN   Clue Cells Wet Prep HPF POC NONE SEEN NONE SEEN   WBC, Wet Prep HPF POC FEW (A) NONE SEEN    Comment: MANY BACTERIA SEEN   Sperm NONE SEEN     Comment: Performed at [facility], [address] [place], [place] [address]  OB Urine Culture     Status: None   Collection Time: 11/09/18 11:58 AM  Result Value Ref Range   Specimen Description      OB CLEAN CATCH Performed at [facility], [address], [place], [place] [address]    Special Requests      Normal Performed at [facility], [address], [place], [place] [address]    Culture      NO GROWTH Performed at [facility], [place] [address] [place], [place] [address]    Report Status 11/10/2018 FINAL     Review of Systems  Constitutional: Negative for fever.  Genitourinary: Positive for frequency, pelvic pain and urgency.  Musculoskeletal: Positive for back pain.   Physical Exam   Blood pressure 118/70, pulse 81, temperature 98.2 F (36.8 C), temperature source Oral, resp. rate 16, weight 70.8 kg, last menstrual period 06/22/2018.  Physical Exam  Constitutional: She is oriented to person, place, and time. She appears well-developed and well-nourished. No distress.  HENT:  Head: Normocephalic.  Eyes: Pupils are equal, round, and reactive to light.  GI: Soft. She exhibits no distension. There is no tenderness. There is no rebound and no CVA tenderness.  Genitourinary:  Genitourinary Comments: Dilation: Closed Effacement (%): Thick Cervical Position: Posterior Exam by:: Noni Saupe NP Wet prep and GC collected   Musculoskeletal: Normal range of motion.  Neurological: She is alert and oriented to person,  place, and time.  Skin: Skin is warm. She is not diaphoretic.  Psychiatric: Her behavior is normal.   Fetal Tracing: Baseline: 130 bpm Variability: Moderate  Accelerations: 10x10 Decelerations: variable  Toco:  None  MAU Course  Procedures    MDM  UA Urine culture  Phenergan given PO here in MAU  BP good today   Assessment and Plan   A:  Nausea/vomiting in pregnancy  Headache in pregnancy, antepartum, second trimester  Urinary tract infection in mother during pregnancy, antepartum   P:  Discharge home in stable condition Rx: Fioricet, Keflex Return to MAU if symptoms worsen Pregnancy support belt  Continue ASA   Thi Klich, Artist Pais, NP 11/10/2018 7:53 PM

## 2018-11-09 NOTE — MAU Note (Addendum)
Pt C/O nauseated, denies vomiting.  Has had diarrhea since Thanksgiving, two episodes today.  Also C/O sharp pain in lower abdomen & back x 3 days. Denies bleeding or LOF.  Reports good fetal movement. Also has HA, sees white spots in R eye.

## 2018-11-10 LAB — GC/CHLAMYDIA PROBE AMP (~~LOC~~) NOT AT ARMC
CHLAMYDIA, DNA PROBE: NEGATIVE
Neisseria Gonorrhea: NEGATIVE

## 2018-11-10 LAB — CULTURE, OB URINE
Culture: NO GROWTH
Special Requests: NORMAL

## 2018-11-15 ENCOUNTER — Encounter: Payer: Self-pay | Admitting: Obstetrics & Gynecology

## 2018-11-16 ENCOUNTER — Encounter: Payer: Self-pay | Admitting: Advanced Practice Midwife

## 2018-12-01 NOTE — L&D Delivery Note (Addendum)
OB/GYN Faculty Practice Delivery Note  Cathy [name] is a 38 y.o. [id number] s/p SVD at [redacted]w[redacted]d. She was admitted for IOL for superimposed preeclampsia with severe features by blood pressures.   ROM: 3h 17m with meconium-stained fluid GBS Status: unknown - received PCN Maximum Maternal Temperature: Temp (24hrs), Avg:98 F (36.7 C), Min:97.5 F (36.4 C), Max:98.4 F (36.9 C)  Labor Progress: . Induction started with FB, transitioned to pitocin . Epidural Placed . AROM light meconium, IUPC placed  Delivery Date/Time: 02/02/19 at 1224 Delivery: Called to room and patient was complete and pushing. Head delivered vertex, LOA. No nuchal cord present, loose shoulder cord present. Shoulder and body delivered in usual fashion. Infant with spontaneous cry, placed on mother's abdomen, dried and stimulated. Cord clamped x 2 after 1-minute delay, and cut by team given poor initial respiratory effort in baby. Cord blood and blood gas drawn. Placenta delivered spontaneously with gentle cord traction. Fundus firm with massage and Pitocin. Labia, perineum, vagina, and cervix inspected inspected with bilateral, hemostatic periurethral lacerations.   Placenta: spontaneous, intact, 3-vessel cord Complications: fetal bradycardia with pushing, terminal meconium  NICU present at delivery but infant only requiring normal resuscitation  Lacerations: bilateral hemostatic periurethral EBL: 456cc - given high PPH, given TXA x 1 at time of delivery, will f/u PM CBC Analgesia: epidural  Postpartum Planning [x]  message to sent to schedule follow-up  [x]  vaccines UTD -- given small/moderate pericardial effusion on CTA, will obtain ECHO and keep on tele until resulted to determine plan to go to [facility] specialty care or cardiac monitored bed -- UOP has been adequate with Mg++, rate decreased to 1g/hr and will continue to follow with foley still in place   Infant: Vigorous female  APGARs 9, 9  2805g  Justus Memory MD PGY-1  Family Medicine [place]  02/02/2019, 12:55 PM  Attestation: I have seen this patient and agree with the resident's documentation. I have examined them separately, and we have discussed the plan of care.  Lambert Mody. Juleen China, DO OB/GYN Fellow

## 2018-12-02 ENCOUNTER — Telehealth: Payer: Self-pay | Admitting: General Practice

## 2018-12-02 NOTE — Telephone Encounter (Signed)
Informed by front office staff patient had problems getting prescriptions and needed help. Called patient and she states she has lupus and is also pregnant. Patient states she currently only has pregnancy medicaid and it isn't covering her lupus injections which she needs. Patient states she is currently unable to work due to pregnancy/lupus. Advised she contact the doctor managing her lupus medications to see if there are discounts or things they can do to help her out. Patient verbalized understanding & had no questions.

## 2018-12-08 ENCOUNTER — Encounter: Payer: Self-pay | Admitting: Obstetrics and Gynecology

## 2018-12-10 ENCOUNTER — Encounter: Payer: Self-pay | Admitting: Obstetrics and Gynecology

## 2018-12-10 ENCOUNTER — Ambulatory Visit (INDEPENDENT_AMBULATORY_CARE_PROVIDER_SITE_OTHER): Payer: Medicaid Other | Admitting: Obstetrics and Gynecology

## 2018-12-10 VITALS — BP 126/73 | HR 70 | Wt 155.5 lb

## 2018-12-10 DIAGNOSIS — O0992 Supervision of high risk pregnancy, unspecified, second trimester: Secondary | ICD-10-CM | POA: Diagnosis present

## 2018-12-10 DIAGNOSIS — M329 Systemic lupus erythematosus, unspecified: Secondary | ICD-10-CM | POA: Diagnosis not present

## 2018-12-10 DIAGNOSIS — Z3009 Encounter for other general counseling and advice on contraception: Secondary | ICD-10-CM | POA: Insufficient documentation

## 2018-12-10 DIAGNOSIS — N179 Acute kidney failure, unspecified: Secondary | ICD-10-CM | POA: Diagnosis not present

## 2018-12-10 DIAGNOSIS — Z23 Encounter for immunization: Secondary | ICD-10-CM | POA: Diagnosis not present

## 2018-12-10 DIAGNOSIS — O0990 Supervision of high risk pregnancy, unspecified, unspecified trimester: Secondary | ICD-10-CM

## 2018-12-10 DIAGNOSIS — D519 Vitamin B12 deficiency anemia, unspecified: Secondary | ICD-10-CM

## 2018-12-10 MED ORDER — CYANOCOBALAMIN 1000 MCG/ML IJ SOLN: INTRAMUSCULAR | 0 refills | Status: DC

## 2018-12-10 MED ORDER — PRENATAL MULTIVITAMIN CH: 1.0000 | ORAL_TABLET | Freq: Every day | ORAL | 0 refills | Status: DC

## 2018-12-10 NOTE — Progress Notes (Signed)
Subjective:  Cathy [name] is a 38 y.o. [phone] at 57w3dbeing seen today for ongoing prenatal care.  She is currently monitored for the following issues for this high-risk pregnancy and has Asthma; MDD (major depressive disorder); Lupus ([place]; Supervision of pregnancy with grand multiparity in second trimester; Substance abuse ([place]; Acute renal failure (ARF) ([place]; Supervision of high risk pregnancy, antepartum; Anemia; and Unwanted fertility on their problem list.  Patient reports no complaints.  Contractions: Not present. Vag. Bleeding: None.  Movement: Present. Denies leaking of fluid.   The following portions of the patient's history were reviewed and updated as appropriate: allergies, current medications, past family history, past medical history, past social history, past surgical history and problem list. Problem list updated.  Objective:   Vitals:   12/10/18 1024  BP: 126/73  Pulse: 70  Weight: 155 lb 8 oz (70.5 kg)    Fetal Status: Fetal Heart Rate (bpm): 138   Movement: Present     General:  Alert, oriented and cooperative. Patient is in no acute distress.  Skin: Skin is warm and dry. No rash noted.   Cardiovascular: Normal heart rate noted  Respiratory: Normal respiratory effort, no problems with respiration noted  Abdomen: Soft, gravid, appropriate for gestational age. Pain/Pressure: Absent     Pelvic:  Cervical exam deferred        Extremities: Normal range of motion.  Edema: Trace  Mental Status: Normal mood and affect. Normal behavior. Normal judgment and thought content.   Urinalysis:      Assessment and Plan:  Pregnancy: [id number] 212w3d1. Supervision of high risk pregnancy, antepartum Stable - Tdap vaccine greater than or equal to 7yo IM - [place] OB FOLLOW UP; Future - Prenatal Vit-Fe Fumarate-FA (PRENATAL MULTIVITAMIN) TABS tablet; Take 1 tablet by mouth daily at 12 noon.  Dispense: 30 tablet; Refill: 0  2. Lupus ([facility]) Stable on Prednisone Growth scan  ordered  3. Acute renal failure, unspecified acute renal failure type ([place]ollege StationLast Crt improved - Comp Met (CMET)  4. Anemia due to vitamin B12 deficiency, unspecified B12 deficiency type Has not picked up B12, reordered today - cyanocobalamin (,VITAMIN B-12,) 1000 MCG/ML injection; Inject weekly.  Dispense: 10 mL; Refill: 0  5. Unwanted fertility BTL papers signed today  Good Rx card provided to help with cost of meds  Preterm labor symptoms and general obstetric precautions including but not limited to vaginal bleeding, contractions, leaking of fluid and fetal movement were reviewed in detail with the patient. Please refer to After Visit Summary for other counseling recommendations.  Return in about 2 weeks (around 12/24/2018) for OB visit.   ErChancy MilroyMD

## 2018-12-11 LAB — COMPREHENSIVE METABOLIC PANEL
ALT: 8 IU/L (ref 0–32)
AST: 8 IU/L (ref 0–40)
Albumin/Globulin Ratio: 1 — ABNORMAL LOW (ref 1.2–2.2)
Albumin: 3.1 g/dL — ABNORMAL LOW (ref 3.5–5.5)
Alkaline Phosphatase: 89 IU/L (ref 39–117)
BUN/Creatinine Ratio: 10 (ref 9–23)
BUN: 9 mg/dL (ref 6–20)
Bilirubin Total: <0.2 mg/dL (ref 0.0–1.2)
CALCIUM: 8.1 mg/dL — AB (ref 8.7–10.2)
CO2: 15 mmol/L — ABNORMAL LOW (ref 20–29)
Chloride: 109 mmol/L — ABNORMAL HIGH (ref 96–106)
Creatinine, Ser: 0.92 mg/dL (ref 0.57–1.00)
GFR calc Af Amer: 93 mL/min/{1.73_m2} (ref >59)
GFR, EST NON AFRICAN AMERICAN: 81 mL/min/{1.73_m2} (ref >59)
Globulin, Total: 3 g/dL (ref 1.5–4.5)
Glucose: 70 mg/dL (ref 65–99)
Potassium: 5 mmol/L (ref 3.5–5.2)
Sodium: 136 mmol/L (ref 134–144)
TOTAL PROTEIN: 6.1 g/dL (ref 6.0–8.5)

## 2018-12-15 ENCOUNTER — Ambulatory Visit (HOSPITAL_COMMUNITY): Admission: RE | Admit: 2018-12-15 | Payer: Medicaid Other | Source: Ambulatory Visit

## 2018-12-16 ENCOUNTER — Encounter: Payer: Self-pay | Admitting: *Deleted

## 2018-12-27 ENCOUNTER — Other Ambulatory Visit: Payer: Self-pay | Admitting: *Deleted

## 2018-12-27 ENCOUNTER — Other Ambulatory Visit: Payer: Self-pay

## 2018-12-27 ENCOUNTER — Encounter: Payer: Self-pay | Admitting: Obstetrics and Gynecology

## 2018-12-27 DIAGNOSIS — O0942 Supervision of pregnancy with grand multiparity, second trimester: Secondary | ICD-10-CM

## 2018-12-27 DIAGNOSIS — O0990 Supervision of high risk pregnancy, unspecified, unspecified trimester: Secondary | ICD-10-CM

## 2019-01-12 ENCOUNTER — Ambulatory Visit (INDEPENDENT_AMBULATORY_CARE_PROVIDER_SITE_OTHER): Payer: Medicaid Other | Admitting: Obstetrics and Gynecology

## 2019-01-12 ENCOUNTER — Other Ambulatory Visit: Payer: Medicaid Other

## 2019-01-12 ENCOUNTER — Encounter: Payer: Self-pay | Admitting: Obstetrics and Gynecology

## 2019-01-12 VITALS — BP 139/70 | HR 88 | Wt 164.0 lb

## 2019-01-12 DIAGNOSIS — O09529 Supervision of elderly multigravida, unspecified trimester: Secondary | ICD-10-CM | POA: Insufficient documentation

## 2019-01-12 DIAGNOSIS — O0942 Supervision of pregnancy with grand multiparity, second trimester: Secondary | ICD-10-CM

## 2019-01-12 DIAGNOSIS — M329 Systemic lupus erythematosus, unspecified: Secondary | ICD-10-CM | POA: Diagnosis not present

## 2019-01-12 DIAGNOSIS — O0990 Supervision of high risk pregnancy, unspecified, unspecified trimester: Secondary | ICD-10-CM

## 2019-01-12 DIAGNOSIS — O0943 Supervision of pregnancy with grand multiparity, third trimester: Secondary | ICD-10-CM | POA: Diagnosis present

## 2019-01-12 DIAGNOSIS — Z9114 Patient's other noncompliance with medication regimen: Secondary | ICD-10-CM | POA: Insufficient documentation

## 2019-01-12 DIAGNOSIS — Z23 Encounter for immunization: Secondary | ICD-10-CM | POA: Diagnosis not present

## 2019-01-12 DIAGNOSIS — Z3A32 32 weeks gestation of pregnancy: Secondary | ICD-10-CM | POA: Diagnosis not present

## 2019-01-12 DIAGNOSIS — Z7952 Long term (current) use of systemic steroids: Secondary | ICD-10-CM | POA: Insufficient documentation

## 2019-01-12 NOTE — Progress Notes (Signed)
    Prenatal Visit Note Date: 01/12/2019 Clinic: [facility]  Subjective:  Cathy [name] is a 38 y.o. [phone] at [redacted]w[redacted]d being seen today for ongoing prenatal care.  She is currently monitored for the following issues for this high-risk pregnancy and has Asthma; MDD (major depressive disorder); Lupus ([place]); Supervision of pregnancy with grand multiparity in second trimester; Substance abuse ([place]); Acute renal failure (ARF) ([place]); Supervision of high risk pregnancy, antepartum; Anemia; Unwanted fertility; AMA (advanced maternal age) multigravida 39+; Non compliance w medication regimen; and Current chronic use of systemic steroids on their problem list.  Patient reports no complaints.   Contractions: Not present. Vag. Bleeding: None.  Movement: Present. Denies leaking of fluid.   The following portions of the patient's history were reviewed and updated as appropriate: allergies, current medications, past family history, past medical history, past social history, past surgical history and problem list. Problem list updated.  Objective:   Vitals:   01/12/19 1012  BP: 139/70  Pulse: 88  Weight: 164 lb (74.4 kg)    Fetal Status: Fetal Heart Rate (bpm): 137 Fundal Height: 33 cm Movement: Present     General:  Alert, oriented and cooperative. Patient is in no acute distress.  Skin: Skin is warm and dry. No rash noted.   Cardiovascular: Normal heart rate noted  Respiratory: Normal respiratory effort, no problems with respiration noted  Abdomen: Soft, gravid, appropriate for gestational age. Pain/Pressure: Present     Pelvic:  Cervical exam deferred        Extremities: Normal range of motion.  Edema: Trace  Mental Status: Normal mood and affect. Normal behavior. Normal judgment and thought content.   Urinalysis:      Assessment and Plan:  Pregnancy: [id number] at [redacted]w[redacted]d  1. Supervision of pregnancy with grand multiparity in second trimester Routine care. Pt unable to  do 2h GTT today. Pt to try and do as lab only visit before next visit. BTL papers UTD.  - Tdap vaccine greater than or equal to 7yo IM  2. Supervision of high risk pregnancy, antepartum Patient has missed several visits. Normal FH. For growth u/s and bpp on Friday. D/w pt need for qwk antenatal testing until delivery. Meds per patient report she's on: Prednisone 20 bid Azathioprine 50 bid Sodium bicarb Folic acid pepcid Prenatal vitamin ASA 81 - [place] MFM FETAL BPP WO NON STRESS; Future  3. Lupus ([place]) Does not have a rheumatologist - [place] MFM FETAL BPP WO NON STRESS; Future  4. [facility] Followed by [facility]. January note reviewed and pt states she has an appt with them tomorrow. Cr appears to be improving  Message sent asking them re: need for stress dose steroids with induction of labor and/or with active labor.  Preterm labor symptoms and general obstetric precautions including but not limited to vaginal bleeding, contractions, leaking of fluid and fetal movement were reviewed in detail with the patient. Please refer to After Visit Summary for other counseling recommendations.  Return in about 9 days (around 01/21/2019) for 7-9d hrob, nst, bpp.   Aletha Halim, MD

## 2019-01-12 NOTE — Progress Notes (Unsigned)
Pt not fasting. Reschedule 2hr GTT.   [employer], CPT Phlebotomist

## 2019-01-13 LAB — CBC
Hematocrit: 21 % — ABNORMAL LOW (ref 34.0–46.6)
Hemoglobin: 7 g/dL — CL (ref 11.1–15.9)
MCH: 30.7 pg (ref 26.6–33.0)
MCHC: 33.3 g/dL (ref 31.5–35.7)
MCV: 92 fL (ref 79–97)
Platelets: 228 10*3/uL (ref 150–450)
RBC: 2.28 x10E6/uL — CL (ref 3.77–5.28)
RDW: 13.9 % (ref 11.7–15.4)
WBC: 5.5 10*3/uL (ref 3.4–10.8)

## 2019-01-13 LAB — RPR: RPR Ser Ql: NONREACTIVE

## 2019-01-13 LAB — HIV ANTIBODY (ROUTINE TESTING W REFLEX): HIV Screen 4th Generation wRfx: NONREACTIVE

## 2019-01-14 ENCOUNTER — Other Ambulatory Visit (HOSPITAL_COMMUNITY): Payer: Self-pay | Admitting: *Deleted

## 2019-01-14 ENCOUNTER — Other Ambulatory Visit (HOSPITAL_COMMUNITY): Payer: Self-pay | Admitting: Maternal & Fetal Medicine

## 2019-01-14 ENCOUNTER — Encounter (HOSPITAL_COMMUNITY): Payer: Self-pay

## 2019-01-14 ENCOUNTER — Other Ambulatory Visit: Payer: Self-pay | Admitting: Obstetrics and Gynecology

## 2019-01-14 ENCOUNTER — Ambulatory Visit (HOSPITAL_COMMUNITY)
Admission: RE | Admit: 2019-01-14 | Discharge: 2019-01-14 | Disposition: A | Discharge status: Home / Self Care | Payer: Medicaid Other | Source: Ambulatory Visit | Attending: Obstetrics and Gynecology | Admitting: Obstetrics and Gynecology

## 2019-01-14 ENCOUNTER — Other Ambulatory Visit: Payer: Self-pay

## 2019-01-14 DIAGNOSIS — O0990 Supervision of high risk pregnancy, unspecified, unspecified trimester: Secondary | ICD-10-CM | POA: Diagnosis present

## 2019-01-14 DIAGNOSIS — O0942 Supervision of pregnancy with grand multiparity, second trimester: Secondary | ICD-10-CM | POA: Diagnosis present

## 2019-01-14 DIAGNOSIS — O09523 Supervision of elderly multigravida, third trimester: Secondary | ICD-10-CM

## 2019-01-14 DIAGNOSIS — O26893 Other specified pregnancy related conditions, third trimester: Secondary | ICD-10-CM

## 2019-01-14 DIAGNOSIS — M329 Systemic lupus erythematosus, unspecified: Secondary | ICD-10-CM

## 2019-01-14 DIAGNOSIS — Z3A32 32 weeks gestation of pregnancy: Secondary | ICD-10-CM | POA: Diagnosis not present

## 2019-01-20 ENCOUNTER — Other Ambulatory Visit: Payer: Self-pay

## 2019-01-20 ENCOUNTER — Encounter: Payer: Self-pay | Admitting: Obstetrics & Gynecology

## 2019-01-21 ENCOUNTER — Ambulatory Visit (HOSPITAL_COMMUNITY): Admission: RE | Admit: 2019-01-21 | Payer: Medicaid Other | Source: Ambulatory Visit

## 2019-01-25 ENCOUNTER — Encounter: Payer: Self-pay | Admitting: Obstetrics & Gynecology

## 2019-01-26 ENCOUNTER — Ambulatory Visit (HOSPITAL_COMMUNITY): Payer: Medicaid Other | Attending: Obstetrics and Gynecology | Admitting: *Deleted

## 2019-01-26 ENCOUNTER — Ambulatory Visit (HOSPITAL_COMMUNITY)
Admission: RE | Admit: 2019-01-26 | Payer: Medicaid Other | Source: Ambulatory Visit | Attending: Obstetrics and Gynecology | Admitting: Obstetrics and Gynecology

## 2019-01-26 ENCOUNTER — Encounter (HOSPITAL_COMMUNITY): Payer: Self-pay

## 2019-01-28 ENCOUNTER — Encounter (HOSPITAL_COMMUNITY): Payer: Self-pay | Admitting: *Deleted

## 2019-01-28 ENCOUNTER — Other Ambulatory Visit (HOSPITAL_COMMUNITY): Payer: Self-pay | Admitting: Maternal & Fetal Medicine

## 2019-01-28 ENCOUNTER — Ambulatory Visit (HOSPITAL_COMMUNITY): Payer: Medicaid Other | Admitting: *Deleted

## 2019-01-28 ENCOUNTER — Ambulatory Visit (HOSPITAL_COMMUNITY)
Admission: RE | Admit: 2019-01-28 | Discharge: 2019-01-28 | Disposition: A | Discharge status: Home / Self Care | Payer: Medicaid Other | Source: Ambulatory Visit | Attending: Obstetrics and Gynecology | Admitting: Obstetrics and Gynecology

## 2019-01-28 ENCOUNTER — Ambulatory Visit: Payer: Self-pay | Admitting: Family Medicine

## 2019-01-28 VITALS — BP 143/86 | HR 71

## 2019-01-28 DIAGNOSIS — O0990 Supervision of high risk pregnancy, unspecified, unspecified trimester: Secondary | ICD-10-CM | POA: Diagnosis present

## 2019-01-28 DIAGNOSIS — O09523 Supervision of elderly multigravida, third trimester: Secondary | ICD-10-CM

## 2019-01-28 DIAGNOSIS — O0942 Supervision of pregnancy with grand multiparity, second trimester: Secondary | ICD-10-CM | POA: Diagnosis present

## 2019-01-28 DIAGNOSIS — M329 Systemic lupus erythematosus, unspecified: Secondary | ICD-10-CM

## 2019-01-28 DIAGNOSIS — Z3A34 34 weeks gestation of pregnancy: Secondary | ICD-10-CM

## 2019-01-28 DIAGNOSIS — O26893 Other specified pregnancy related conditions, third trimester: Secondary | ICD-10-CM

## 2019-01-28 NOTE — Procedures (Signed)
Cathy [name] 11/18/1981 [redacted]w[redacted]d  Fetus A Non-Stress Test Interpretation for 01/28/19  Indication: Unsatisfactory BPP  Fetal Heart Rate A Mode: External Baseline Rate (A): 130 bpm Variability: Moderate Accelerations: 15 x 15 Decelerations: None  Uterine Activity Mode: Toco Contraction Frequency (min): none noted  Interpretation (Fetal Testing) Nonstress Test Interpretation: Reactive Comments: FHR tracing rev'd by Dr. Gertie Exon

## 2019-02-01 ENCOUNTER — Other Ambulatory Visit: Payer: Self-pay

## 2019-02-01 ENCOUNTER — Inpatient Hospital Stay (HOSPITAL_COMMUNITY)
Admission: EM | Admit: 2019-02-01 | Discharge: 2019-02-06 | DRG: 797 | Disposition: A | Discharge status: Home / Self Care | Payer: Medicaid Other | Attending: Obstetrics and Gynecology | Admitting: Obstetrics and Gynecology

## 2019-02-01 ENCOUNTER — Emergency Department (HOSPITAL_COMMUNITY): Payer: Medicaid Other

## 2019-02-01 ENCOUNTER — Encounter (HOSPITAL_COMMUNITY): Payer: Self-pay

## 2019-02-01 DIAGNOSIS — O9952 Diseases of the respiratory system complicating childbirth: Secondary | ICD-10-CM | POA: Diagnosis present

## 2019-02-01 DIAGNOSIS — I129 Hypertensive chronic kidney disease with stage 1 through stage 4 chronic kidney disease, or unspecified chronic kidney disease: Secondary | ICD-10-CM | POA: Diagnosis present

## 2019-02-01 DIAGNOSIS — Z302 Encounter for sterilization: Secondary | ICD-10-CM

## 2019-02-01 DIAGNOSIS — Z9114 Patient's other noncompliance with medication regimen: Secondary | ICD-10-CM | POA: Diagnosis not present

## 2019-02-01 DIAGNOSIS — J45909 Unspecified asthma, uncomplicated: Secondary | ICD-10-CM | POA: Diagnosis present

## 2019-02-01 DIAGNOSIS — N736 Female pelvic peritoneal adhesions (postinfective): Secondary | ICD-10-CM | POA: Clinically undetermined

## 2019-02-01 DIAGNOSIS — O99334 Smoking (tobacco) complicating childbirth: Secondary | ICD-10-CM | POA: Diagnosis present

## 2019-02-01 DIAGNOSIS — O26833 Pregnancy related renal disease, third trimester: Secondary | ICD-10-CM | POA: Diagnosis present

## 2019-02-01 DIAGNOSIS — I340 Nonrheumatic mitral (valve) insufficiency: Secondary | ICD-10-CM | POA: Diagnosis not present

## 2019-02-01 DIAGNOSIS — O0990 Supervision of high risk pregnancy, unspecified, unspecified trimester: Secondary | ICD-10-CM

## 2019-02-01 DIAGNOSIS — Z01818 Encounter for other preprocedural examination: Secondary | ICD-10-CM

## 2019-02-01 DIAGNOSIS — Z3009 Encounter for other general counseling and advice on contraception: Secondary | ICD-10-CM | POA: Diagnosis present

## 2019-02-01 DIAGNOSIS — O114 Pre-existing hypertension with pre-eclampsia, complicating childbirth: Principal | ICD-10-CM | POA: Diagnosis present

## 2019-02-01 DIAGNOSIS — M3214 Glomerular disease in systemic lupus erythematosus: Secondary | ICD-10-CM | POA: Diagnosis present

## 2019-02-01 DIAGNOSIS — O1002 Pre-existing essential hypertension complicating childbirth: Secondary | ICD-10-CM | POA: Diagnosis present

## 2019-02-01 DIAGNOSIS — F17210 Nicotine dependence, cigarettes, uncomplicated: Secondary | ICD-10-CM | POA: Diagnosis present

## 2019-02-01 DIAGNOSIS — Z91148 Patient's other noncompliance with medication regimen for other reason: Secondary | ICD-10-CM

## 2019-02-01 DIAGNOSIS — O1493 Unspecified pre-eclampsia, third trimester: Secondary | ICD-10-CM

## 2019-02-01 DIAGNOSIS — O9902 Anemia complicating childbirth: Secondary | ICD-10-CM | POA: Diagnosis present

## 2019-02-01 DIAGNOSIS — M329 Systemic lupus erythematosus, unspecified: Secondary | ICD-10-CM | POA: Diagnosis present

## 2019-02-01 DIAGNOSIS — Z3A35 35 weeks gestation of pregnancy: Secondary | ICD-10-CM | POA: Diagnosis not present

## 2019-02-01 DIAGNOSIS — O09529 Supervision of elderly multigravida, unspecified trimester: Secondary | ICD-10-CM

## 2019-02-01 DIAGNOSIS — Z7952 Long term (current) use of systemic steroids: Secondary | ICD-10-CM | POA: Diagnosis not present

## 2019-02-01 DIAGNOSIS — J811 Chronic pulmonary edema: Secondary | ICD-10-CM | POA: Diagnosis present

## 2019-02-01 DIAGNOSIS — M3212 Pericarditis in systemic lupus erythematosus: Secondary | ICD-10-CM | POA: Diagnosis present

## 2019-02-01 DIAGNOSIS — I161 Hypertensive emergency: Secondary | ICD-10-CM

## 2019-02-01 DIAGNOSIS — F329 Major depressive disorder, single episode, unspecified: Secondary | ICD-10-CM | POA: Diagnosis present

## 2019-02-01 DIAGNOSIS — R0602 Shortness of breath: Secondary | ICD-10-CM | POA: Diagnosis present

## 2019-02-01 DIAGNOSIS — O1414 Severe pre-eclampsia complicating childbirth: Secondary | ICD-10-CM | POA: Diagnosis not present

## 2019-02-01 DIAGNOSIS — D649 Anemia, unspecified: Secondary | ICD-10-CM | POA: Diagnosis present

## 2019-02-01 DIAGNOSIS — J810 Acute pulmonary edema: Secondary | ICD-10-CM

## 2019-02-01 DIAGNOSIS — O0942 Supervision of pregnancy with grand multiparity, second trimester: Secondary | ICD-10-CM

## 2019-02-01 HISTORY — DX: Hypertensive chronic kidney disease with stage 1 through stage 4 chronic kidney disease, or unspecified chronic kidney disease: I12.9

## 2019-02-01 LAB — COMPREHENSIVE METABOLIC PANEL
ALK PHOS: 95 U/L (ref 38–126)
ALT: 11 U/L (ref 0–44)
AST: 14 U/L — ABNORMAL LOW (ref 15–41)
Albumin: 2.2 g/dL — ABNORMAL LOW (ref 3.5–5.0)
Anion gap: 5 (ref 5–15)
BUN: 10 mg/dL (ref 6–20)
CO2: 17 mmol/L — ABNORMAL LOW (ref 22–32)
Calcium: 7.6 mg/dL — ABNORMAL LOW (ref 8.9–10.3)
Chloride: 107 mmol/L (ref 98–111)
Creatinine, Ser: 1.09 mg/dL — ABNORMAL HIGH (ref 0.44–1.00)
GFR calc Af Amer: >60 mL/min (ref >60)
GFR calc non Af Amer: >60 mL/min (ref >60)
Glucose, Bld: 104 mg/dL — ABNORMAL HIGH (ref 70–99)
Potassium: 4.1 mmol/L (ref 3.5–5.1)
SODIUM: 129 mmol/L — AB (ref 135–145)
Total Bilirubin: 0.6 mg/dL (ref 0.3–1.2)
Total Protein: 5.8 g/dL — ABNORMAL LOW (ref 6.5–8.1)

## 2019-02-01 LAB — URINALYSIS, ROUTINE W REFLEX MICROSCOPIC
Bacteria, UA: NONE SEEN
Bilirubin Urine: NEGATIVE
Glucose, UA: NEGATIVE mg/dL
Ketones, ur: NEGATIVE mg/dL
Leukocytes,Ua: NEGATIVE
Nitrite: NEGATIVE
Protein, ur: >=300 mg/dL — AB
RBC / HPF: >50 RBC/hpf — ABNORMAL HIGH (ref 0–5)
Specific Gravity, Urine: 1.017 (ref 1.005–1.030)
pH: 6 (ref 5.0–8.0)

## 2019-02-01 LAB — CBC WITH DIFFERENTIAL/PLATELET
Abs Immature Granulocytes: 0.08 10*3/uL — ABNORMAL HIGH (ref 0.00–0.07)
Basophils Absolute: 0 10*3/uL (ref 0.0–0.1)
Basophils Relative: 0 %
EOS ABS: 0 10*3/uL (ref 0.0–0.5)
Eosinophils Relative: 0 %
HEMATOCRIT: 22.1 % — AB (ref 36.0–46.0)
Hemoglobin: 7.1 g/dL — ABNORMAL LOW (ref 12.0–15.0)
IMMATURE GRANULOCYTES: 1 %
LYMPHS ABS: 1 10*3/uL (ref 0.7–4.0)
Lymphocytes Relative: 12 %
MCH: 30.7 pg (ref 26.0–34.0)
MCHC: 32.1 g/dL (ref 30.0–36.0)
MCV: 95.7 fL (ref 80.0–100.0)
Monocytes Absolute: 0.6 10*3/uL (ref 0.1–1.0)
Monocytes Relative: 6 %
Neutro Abs: 6.9 10*3/uL (ref 1.7–7.7)
Neutrophils Relative %: 81 %
Platelets: 208 10*3/uL (ref 150–400)
RBC: 2.31 MIL/uL — ABNORMAL LOW (ref 3.87–5.11)
RDW: 14.4 % (ref 11.5–15.5)
WBC: 8.6 10*3/uL (ref 4.0–10.5)
nRBC: 0.2 % (ref 0.0–0.2)

## 2019-02-01 LAB — PREPARE RBC (CROSSMATCH)

## 2019-02-01 LAB — PROTEIN / CREATININE RATIO, URINE
Creatinine, Urine: 136.68 mg/dL
Protein Creatinine Ratio: 6.5 mg/mg{Cre} — ABNORMAL HIGH (ref 0.00–0.15)
Total Protein, Urine: 889 mg/dL

## 2019-02-01 LAB — TROPONIN I: Troponin I: <0.03 ng/mL (ref <0.03)

## 2019-02-01 MED ORDER — TERBUTALINE SULFATE 1 MG/ML IJ SOLN: 0.2500 mg | Freq: Once | INTRAMUSCULAR | Status: DC | PRN

## 2019-02-01 MED ORDER — LABETALOL HCL 5 MG/ML IV SOLN
20.0000 mg | INTRAVENOUS | Status: DC | PRN
  Administered 2019-02-01 – 2019-02-02 (×2): 20 mg via INTRAVENOUS
  Filled 2019-02-01: qty 4

## 2019-02-01 MED ORDER — OXYCODONE-ACETAMINOPHEN 5-325 MG PO TABS: 1.0000 | ORAL_TABLET | ORAL | Status: DC | PRN

## 2019-02-01 MED ORDER — SODIUM CHLORIDE 0.9% IV SOLUTION: Freq: Once | INTRAVENOUS | Status: DC

## 2019-02-01 MED ORDER — EPHEDRINE 5 MG/ML INJ: 10.0000 mg | INTRAVENOUS | Status: DC | PRN

## 2019-02-01 MED ORDER — IOHEXOL 350 MG/ML SOLN
80.0000 mL | Freq: Once | INTRAVENOUS | Status: AC | PRN
  Administered 2019-02-01: 100 mL via INTRAVENOUS

## 2019-02-01 MED ORDER — SODIUM CHLORIDE 0.9 % IV SOLN
5.0000 10*6.[IU] | Freq: Once | INTRAVENOUS | Status: AC
  Administered 2019-02-02: 5 10*6.[IU] via INTRAVENOUS
  Filled 2019-02-01: qty 5

## 2019-02-01 MED ORDER — PHENYLEPHRINE 40 MCG/ML (10ML) SYRINGE FOR IV PUSH (FOR BLOOD PRESSURE SUPPORT): 80.0000 ug | PREFILLED_SYRINGE | INTRAVENOUS | Status: DC | PRN

## 2019-02-01 MED ORDER — HYDRALAZINE HCL 20 MG/ML IJ SOLN
10.0000 mg | INTRAMUSCULAR | Status: DC | PRN
  Administered 2019-02-01 (×2): 10 mg via INTRAVENOUS
  Filled 2019-02-01 (×2): qty 1

## 2019-02-01 MED ORDER — PREDNISONE 20 MG PO TABS
40.0000 mg | ORAL_TABLET | Freq: Every day | ORAL | Status: DC
  Administered 2019-02-02 – 2019-02-06 (×5): 40 mg via ORAL
  Filled 2019-02-01 (×6): qty 2

## 2019-02-01 MED ORDER — OXYTOCIN BOLUS FROM INFUSION
500.0000 mL | Freq: Once | INTRAVENOUS | Status: AC
  Administered 2019-02-02: 500 mL via INTRAVENOUS

## 2019-02-01 MED ORDER — ONDANSETRON HCL 4 MG/2ML IJ SOLN
4.0000 mg | Freq: Four times a day (QID) | INTRAMUSCULAR | Status: DC | PRN
  Administered 2019-02-02: 4 mg via INTRAVENOUS
  Filled 2019-02-01: qty 2

## 2019-02-01 MED ORDER — LIDOCAINE HCL (PF) 1 % IJ SOLN: 30.0000 mL | INTRAMUSCULAR | Status: DC | PRN

## 2019-02-01 MED ORDER — LABETALOL HCL 5 MG/ML IV SOLN
40.0000 mg | INTRAVENOUS | Status: DC | PRN
  Administered 2019-02-01: 40 mg via INTRAVENOUS
  Filled 2019-02-01: qty 8

## 2019-02-01 MED ORDER — SOD CITRATE-CITRIC ACID 500-334 MG/5ML PO SOLN: 30.0000 mL | ORAL | Status: DC | PRN

## 2019-02-01 MED ORDER — PENICILLIN G 3 MILLION UNITS IVPB - SIMPLE MED
3.0000 10*6.[IU] | INTRAVENOUS | Status: DC
  Administered 2019-02-02 (×2): 3 10*6.[IU] via INTRAVENOUS
  Filled 2019-02-01 (×2): qty 100

## 2019-02-01 MED ORDER — LACTATED RINGERS IV SOLN: 500.0000 mL | INTRAVENOUS | Status: DC | PRN

## 2019-02-01 MED ORDER — LACTATED RINGERS IV SOLN
INTRAVENOUS | Status: DC
  Administered 2019-02-02: 03:00:00 via INTRAVENOUS

## 2019-02-01 MED ORDER — BETAMETHASONE SOD PHOS & ACET 6 (3-3) MG/ML IJ SUSP
12.0000 mg | Freq: Once | INTRAMUSCULAR | Status: AC
  Administered 2019-02-01: 12 mg via INTRAMUSCULAR
  Filled 2019-02-01: qty 2

## 2019-02-01 MED ORDER — MAGNESIUM SULFATE BOLUS VIA INFUSION
4.0000 g | Freq: Once | INTRAVENOUS | Status: AC
  Administered 2019-02-01: 4 g via INTRAVENOUS
  Filled 2019-02-01: qty 500

## 2019-02-01 MED ORDER — LABETALOL HCL 5 MG/ML IV SOLN
INTRAVENOUS | Status: AC
  Administered 2019-02-01: 20 mg via INTRAVENOUS
  Filled 2019-02-01: qty 4

## 2019-02-01 MED ORDER — OXYCODONE-ACETAMINOPHEN 5-325 MG PO TABS: 2.0000 | ORAL_TABLET | ORAL | Status: DC | PRN

## 2019-02-01 MED ORDER — PHENYLEPHRINE 40 MCG/ML (10ML) SYRINGE FOR IV PUSH (FOR BLOOD PRESSURE SUPPORT)
80.0000 ug | PREFILLED_SYRINGE | INTRAVENOUS | Status: DC | PRN
  Filled 2019-02-01: qty 10

## 2019-02-01 MED ORDER — FUROSEMIDE 10 MG/ML IJ SOLN
20.0000 mg | Freq: Once | INTRAMUSCULAR | Status: AC
  Administered 2019-02-02: 20 mg via INTRAVENOUS
  Filled 2019-02-01 (×3): qty 2

## 2019-02-01 MED ORDER — ACETAMINOPHEN 325 MG PO TABS
650.0000 mg | ORAL_TABLET | ORAL | Status: DC | PRN
  Administered 2019-02-02 (×2): 650 mg via ORAL
  Filled 2019-02-01 (×2): qty 2

## 2019-02-01 MED ORDER — FENTANYL-BUPIVACAINE-NACL 0.5-0.125-0.9 MG/250ML-% EP SOLN
12.0000 mL/h | EPIDURAL | Status: DC | PRN
  Filled 2019-02-01: qty 250

## 2019-02-01 MED ORDER — OXYTOCIN 40 UNITS IN NORMAL SALINE INFUSION - SIMPLE MED
2.5000 [IU]/h | INTRAVENOUS | Status: DC
  Administered 2019-02-02: 2.5 [IU]/h via INTRAVENOUS

## 2019-02-01 MED ORDER — MAGNESIUM SULFATE 40 G IN LACTATED RINGERS - SIMPLE
1.0000 g/h | INTRAVENOUS | Status: DC
  Administered 2019-02-01: 2 g/h via INTRAVENOUS
  Filled 2019-02-01 (×2): qty 500

## 2019-02-01 MED ORDER — LACTATED RINGERS IV SOLN: 500.0000 mL | Freq: Once | INTRAVENOUS | Status: DC

## 2019-02-01 MED ORDER — DIPHENHYDRAMINE HCL 50 MG/ML IJ SOLN: 12.5000 mg | INTRAMUSCULAR | Status: DC | PRN

## 2019-02-01 MED ORDER — OXYTOCIN 40 UNITS IN NORMAL SALINE INFUSION - SIMPLE MED
1.0000 m[IU]/min | INTRAVENOUS | Status: DC
  Administered 2019-02-01: 2 m[IU]/min via INTRAVENOUS
  Filled 2019-02-01: qty 1000

## 2019-02-01 MED ORDER — LABETALOL HCL 5 MG/ML IV SOLN
80.0000 mg | INTRAVENOUS | Status: DC | PRN
  Administered 2019-02-01 – 2019-02-03 (×2): 80 mg via INTRAVENOUS
  Filled 2019-02-01 (×2): qty 16

## 2019-02-01 MED ORDER — SODIUM CHLORIDE 0.9 % IV BOLUS
1000.0000 mL | Freq: Once | INTRAVENOUS | Status: AC
  Administered 2019-02-01: 1000 mL via INTRAVENOUS

## 2019-02-01 NOTE — ED Provider Notes (Signed)
[facility] EMERGENCY DEPARTMENT Provider Note   CSN: [id number] Arrival date & time: 02/01/19  1941    History   Chief Complaint Chief Complaint  Patient presents with  . Chest Pain  . Shortness of Breath    HPI Cathy [name] is a 38 y.o. female.     Pt presents to the ED today with CP, sob, and abdominal pain.  She is [redacted]w[redacted]d pregnant.  She has a hx of CKD and Lupus.  She is also having diarrhea and frequent urination.  She denies vaginal bleeding or vaginal d/c.  Pt said she gets sob with moving around.     Past Medical History:  Diagnosis Date  . Anxiety   . Asthma   . Bipolar depression ([place])   . Depression   . Gonorrhea   . Lupus ([place])   . Pelvic inflammatory disease (PID)   . Trichomonas infection     Patient Active Problem List   Diagnosis Date Noted  . Pulmonary edema 02/01/2019  . AMA (advanced maternal age) multigravida 35+ 01/12/2019  . Non compliance w medication regimen 01/12/2019  . Current chronic use of systemic steroids 01/12/2019  . Unwanted fertility 12/10/2018  . Anemia 10/12/2018  . Supervision of high risk pregnancy, antepartum 10/04/2018  . Lupus ([place]) 09/20/2018  . Supervision of pregnancy with grand multiparity in second trimester 09/20/2018  . Substance abuse ([place]) 09/20/2018  . Acute renal failure (ARF) ([place]) 09/20/2018  . MDD (major depressive disorder) 10/11/2015  . Asthma 09/22/2011    Past Surgical History:  Procedure Laterality Date  . NO PAST SURGERIES    . RENAL BIOPSY       OB History    Gravida  6   Para  4   Term  4   Preterm  0   AB  1   Living  4     SAB  1   TAB  0   Ectopic  0   Multiple  0   Live Births  4            Home Medications    Prior to Admission medications   Medication Sig Start Date End Date Taking? Authorizing Provider  famotidine (PEPCID) 20 MG tablet Take 1 tablet (20 mg total) by mouth 2 (two) times daily. 10/18/18  Yes Truett Mainland, DO    predniSONE (DELTASONE) 20 MG tablet Take 2 tablets (40 mg total) by mouth daily with breakfast. 09/29/18  Yes Ghimire, Henreitta Leber, MD  Prenatal Vit-Fe Fumarate-FA (PRENATAL MULTIVITAMIN) TABS tablet Take 1 tablet by mouth daily at 12 noon. 12/10/18  Yes Chancy Milroy, MD  sodium bicarbonate 650 MG tablet Take 1 tablet (650 mg total) by mouth 2 (two) times daily. 09/29/18  Yes Ghimire, Henreitta Leber, MD  aspirin EC 81 MG tablet Take 1 tablet (81 mg total) by mouth daily. Take after 12 weeks for prevention of preeclampsia later in pregnancy Patient not taking: Reported on 01/28/2019 10/04/18   Truett Mainland, DO  azaTHIOprine (IMURAN) 50 MG tablet Take 1 tablet (50 mg total) by mouth 2 (two) times daily. Patient not taking: Reported on 01/28/2019 09/29/18   Jonetta Osgood, MD  butalbital-acetaminophen-caffeine (FIORICET, ESGIC) [phone] MG tablet Take 1-2 tablets by mouth every 6 (six) hours as needed for headache. Patient not taking: Reported on 01/28/2019 11/09/18 11/09/19  Rasch, Anderson Malta I, NP  cyanocobalamin (,VITAMIN B-12,) 1000 MCG/ML injection Inject weekly. Patient not taking: Reported on 01/28/2019 12/10/18  Chancy Milroy, MD  folic acid (FOLVITE) 1 MG tablet Take 1 tablet (1 mg total) by mouth daily. Patient not taking: Reported on 02/01/2019 09/29/18   Jonetta Osgood, MD  promethazine (PHENERGAN) 12.5 MG tablet Take 1 tablet (12.5 mg total) by mouth every 6 (six) hours as needed for nausea or vomiting. Patient not taking: Reported on 02/01/2019 11/09/18   Rasch, Artist Pais, NP    Family History Family History  Problem Relation Age of Onset  . Diabetes Maternal Grandmother   . Hypertension Maternal Grandmother   . Diabetes Maternal Grandfather   . Hypertension Maternal Grandfather   . Diabetes Paternal Grandmother   . Hypertension Paternal Grandmother   . Diabetes Paternal Grandfather   . Hypertension Paternal Grandfather     Social History Social History   Tobacco Use   . Smoking status: Current Some Day Smoker    Packs/day: 0.25    Types: Cigarettes  . Smokeless tobacco: Never Used  Substance Use Topics  . Alcohol use: Not Currently    Frequency: Never  . Drug use: Not Currently    Types: Marijuana    Comment: 3 times a week      Allergies   Patient has no known allergies.   Review of Systems Review of Systems  Cardiovascular: Positive for chest pain.  Gastrointestinal: Positive for abdominal pain and diarrhea.  All other systems reviewed and are negative.    Physical Exam Updated Vital Signs BP (!) 147/95   Pulse 83   Temp 98.4 F (36.9 C)   Resp (!) 24   LMP 06/22/2018   SpO2 99%   Physical Exam Vitals signs and nursing note reviewed.  Constitutional:      Appearance: She is well-developed.  HENT:     Head: Normocephalic and atraumatic.  Eyes:     Extraocular Movements: Extraocular movements intact.     Pupils: Pupils are equal, round, and reactive to light.  Neck:     Musculoskeletal: Normal range of motion and neck supple.  Cardiovascular:     Rate and Rhythm: Regular rhythm. Tachycardia present.     Heart sounds: Normal heart sounds.  Pulmonary:     Effort: Pulmonary effort is normal.     Breath sounds: Normal breath sounds.  Abdominal:     Palpations: Abdomen is soft.     Comments: Gravid uterus  Musculoskeletal: Normal range of motion.  Skin:    General: Skin is warm.     Capillary Refill: Capillary refill takes less than 2 seconds.  Neurological:     General: No focal deficit present.     Mental Status: She is alert and oriented to person, place, and time.  Psychiatric:        Mood and Affect: Mood normal.        Behavior: Behavior normal.      ED Treatments / Results  Labs (all labs ordered are listed, but only abnormal results are displayed) Labs Reviewed  COMPREHENSIVE METABOLIC PANEL - Abnormal; Notable for the following components:      Result Value   Sodium 129 (*)    CO2 17 (*)     Glucose, Bld 104 (*)    Creatinine, Ser 1.09 (*)    Calcium 7.6 (*)    Total Protein 5.8 (*)    Albumin 2.2 (*)    AST 14 (*)    All other components within normal limits  CBC WITH DIFFERENTIAL/PLATELET - Abnormal; Notable for the following components:  RBC 2.31 (*)    Hemoglobin 7.1 (*)    HCT 22.1 (*)    Abs Immature Granulocytes 0.08 (*)    All other components within normal limits  URINALYSIS, ROUTINE W REFLEX MICROSCOPIC - Abnormal; Notable for the following components:   APPearance HAZY (*)    Hgb urine dipstick MODERATE (*)    Protein, ur >=300 (*)    RBC / HPF >50 (*)    All other components within normal limits  PROTEIN / CREATININE RATIO, URINE - Abnormal; Notable for the following components:   Protein Creatinine Ratio 6.50 (*)    All other components within normal limits  URINE CULTURE  GROUP B STREP BY PCR  TROPONIN I  RPR  COMPREHENSIVE METABOLIC PANEL  CBC  CBC  MAGNESIUM  MAGNESIUM  TYPE AND SCREEN  PREPARE RBC (CROSSMATCH)    EKG EKG Interpretation  Date/Time:  Tuesday February 01 2019 19:50:03 EST Ventricular Rate:  85 PR Interval:    QRS Duration: 83 QT Interval:  368 QTC Calculation: 438 R Axis:   51 Text Interpretation:  Sinus rhythm Probable left atrial enlargement Left ventricular hypertrophy No significant change since last tracing Confirmed by Isla Pence [phone]) on 02/01/2019 7:56:32 PM   Radiology Ct Angio Chest Pe W And/or Wo Contrast  Result Date: 02/01/2019 CLINICAL DATA:  Chest pain and short of breath, [redacted] weeks pregnant with history of lupus EXAM: CT ANGIOGRAPHY CHEST WITH CONTRAST TECHNIQUE: Multidetector CT imaging of the chest was performed using the standard protocol during bolus administration of intravenous contrast. Multiplanar CT image reconstructions and MIPs were obtained to evaluate the vascular anatomy. CONTRAST:  182mL OMNIPAQUE IOHEXOL 350 MG/ML SOLN COMPARISON:  Chest x-ray 02/01/2019 FINDINGS: Cardiovascular: Suboptimal  contrast opacification of the pulmonary arterial system limits evaluation for emboli. No acute central filling defects are seen. Nonaneurysmal aorta. Cardiomegaly. Moderate pericardial effusion, measuring up to 16 mm in thickness. Mediastinum/Nodes: Midline trachea. No thyroid mass. Mild axillary adenopathy, measuring up to 15 mm on the right and 15 mm on the left. Esophagus within normal limits. Lungs/Pleura: Small bilateral pleural effusions right greater than left. Hazy ground-glass densities with septal thickening at the lung bases. Upper Abdomen: No acute abnormality. Musculoskeletal: No chest wall abnormality. No acute or significant osseous findings. Review of the MIP images confirms the above findings. IMPRESSION: 1. Limited evaluation of the pulmonary arterial system secondary to suboptimal contrast opacification. No definite acute central PE is seen. 2. Cardiomegaly with small moderate pericardial effusion. Small bilateral pleural effusions right greater than left with septal thickening and ground-glass densities suspicious for pulmonary edema. 3. Mild axillary adenopathy Electronically Signed   By: Donavan Foil M.D.   On: 02/01/2019 22:29   Dg Chest Portable 1 View  Result Date: 02/01/2019 CLINICAL DATA:  Chest pain EXAM: PORTABLE CHEST 1 VIEW COMPARISON:  09/20/2018 FINDINGS: Cardiac enlargement with vascular congestion. Bibasilar airspace disease is new since the prior study. No effusion IMPRESSION: Cardiac enlargement. Interval development of vascular congestion and bibasilar airspace disease suspicious for pulmonary edema. Pneumonia is also a consideration. Electronically Signed   By: Franchot Gallo M.D.   On: 02/01/2019 20:27    Procedures Procedures (including critical care time)  Medications Ordered in ED Medications  labetalol (NORMODYNE,TRANDATE) injection 20 mg (20 mg Intravenous Given 02/01/19 2030)    And  labetalol (NORMODYNE,TRANDATE) injection 40 mg (40 mg Intravenous Given  02/01/19 2049)    And  labetalol (NORMODYNE,TRANDATE) injection 80 mg (80 mg Intravenous Given 02/01/19 2101)  And  hydrALAZINE (APRESOLINE) injection 10 mg (10 mg Intravenous Given 02/01/19 2118)  magnesium sulfate 40 grams in LR 500 mL OB infusion (2 g/hr Intravenous Transfusing/Transfer 02/01/19 2235)  0.9 %  sodium chloride infusion (Manually program via Guardrails IV Fluids) (has no administration in time range)  predniSONE (DELTASONE) tablet 40 mg (has no administration in time range)  lactated ringers infusion (has no administration in time range)  oxytocin (PITOCIN) IV BOLUS FROM BAG (has no administration in time range)  oxytocin (PITOCIN) IV infusion 40 units in NS 1000 mL - Premix (has no administration in time range)  lactated ringers infusion 500-1,000 mL (has no administration in time range)  acetaminophen (TYLENOL) tablet 650 mg (has no administration in time range)  oxyCODONE-acetaminophen (PERCOCET/ROXICET) 5-325 MG per tablet 1 tablet (has no administration in time range)  oxyCODONE-acetaminophen (PERCOCET/ROXICET) 5-325 MG per tablet 2 tablet (has no administration in time range)  ondansetron (ZOFRAN) injection 4 mg (has no administration in time range)  sodium citrate-citric acid (ORACIT) solution 30 mL (has no administration in time range)  lidocaine (PF) (XYLOCAINE) 1 % injection 30 mL (has no administration in time range)  terbutaline (BRETHINE) injection 0.25 mg (has no administration in time range)  penicillin G potassium 5 Million Units in sodium chloride 0.9 % 250 mL IVPB (has no administration in time range)    Followed by  penicillin G 3 million units in sodium chloride 0.9% 100 mL IVPB (has no administration in time range)  betamethasone acetate-betamethasone sodium phosphate (CELESTONE) injection 12 mg (has no administration in time range)  furosemide (LASIX) injection 20 mg (has no administration in time range)  oxytocin (PITOCIN) IV infusion 40 units in NS 1000 mL -  Premix (has no administration in time range)  sodium chloride 0.9 % bolus 1,000 mL (0 mLs Intravenous Stopped 02/01/19 2236)  magnesium bolus via infusion 4 g (4 g Intravenous Bolus from Bag 02/01/19 2155)  iohexol (OMNIPAQUE) 350 MG/ML injection 80 mL (100 mLs Intravenous Contrast Given 02/01/19 2208)     Initial Impression / Assessment and Plan / ED Course  I have reviewed the triage vital signs and the nursing notes.  Pertinent labs & imaging results that were available during my care of the patient were reviewed by me and considered in my medical decision making (see chart for details).        Rapid OB response called upon pt's arrival.  Baby looks ok.  BP very elevated.  Pt given labetalol and then started on a magnesium drip.  She had a CT angio to r/o PE.  No PE.  She does have some pulmonary edema.  Likely due to HTN.  She is saturating well on RA.  Pt d/w Dr. Nehemiah Settle who will take her to L&D for induction of labor.  CRITICAL CARE Performed by: Isla Pence   Total critical care time: 30 minutes  Critical care time was exclusive of separately billable procedures and treating other patients.  Critical care was necessary to treat or prevent imminent or life-threatening deterioration.  Critical care was time spent personally by me on the following activities: development of treatment plan with patient and/or surrogate as well as nursing, discussions with consultants, evaluation of patient's response to treatment, examination of patient, obtaining history from patient or surrogate, ordering and performing treatments and interventions, ordering and review of laboratory studies, ordering and review of radiographic studies, pulse oximetry and re-evaluation of patient's condition.  Final Clinical Impressions(s) / ED Diagnoses   Final  diagnoses:  Hypertensive emergency  [redacted] weeks gestation of pregnancy  Pre-eclampsia in third trimester  Lupus nephritis ([place])  Chronic anemia  Acute  pulmonary edema [facility])    ED Discharge Orders    None       Isla Pence, MD 02/01/19 2315

## 2019-02-01 NOTE — ED Triage Notes (Signed)
Pt arrives from [facility] from home, pt has had CP/SOB since last night with some abdominal cramping. Pt is [redacted] weeks pregnant. Hx of lupus, has been told will need dialysis once she gives birth.

## 2019-02-01 NOTE — ED Notes (Signed)
Rapid OB at bedside 

## 2019-02-01 NOTE — H&P (Signed)
Cathy [name] is a 38 y.o. female [phone] at [redacted]w[redacted]d presenting for SOB and chest pain. Symptoms started last night, SOB worsened over night. Has spent most of the day laying down. SOB worsens with activity. Presented here for evaluation.  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 Also complicated by [facility] and chronic anemia with hemoglobin at 7.0.  OB History    Gravida  6   Para  4   Term  4   Preterm  0   AB  1   Living  4     SAB  1   TAB  0   Ectopic  0   Multiple  0   Live Births  4          Past Medical History:  Diagnosis Date  . Anxiety   . Asthma   . Bipolar depression ([place])   . Depression   . Gonorrhea   . Lupus ([place])   . Pelvic inflammatory disease (PID)   . Trichomonas infection    Past Surgical History:  Procedure Laterality Date  . NO PAST SURGERIES    . RENAL BIOPSY     Family History: family history includes Diabetes in her maternal grandfather, maternal grandmother, paternal grandfather, and paternal grandmother; Hypertension in her maternal grandfather, maternal grandmother, paternal grandfather, and paternal grandmother. Social History:  reports that she has been smoking cigarettes. She has been smoking about 0.25 packs per day. She has never used smokeless tobacco. She reports previous alcohol use. She reports previous drug use. Drug: Marijuana.     Maternal Diabetes: No Genetic Screening: Normal Maternal Ultrasounds/Referrals: Normal Fetal Ultrasounds or other Referrals:  None Maternal Substance Abuse:  No Significant Maternal Medications:  Meds include: Other: azathioprine, prednisone Significant Maternal Lab Results:  Lab values include: Other:  Other Comments:  GBS Unknown  ROS History Dilation: 1 Effacement (%): 50 Exam by:: Yossef Gilkison DO Blood pressure (!) 147/95, pulse 83, temperature 98.4 F (36.9 C), resp. rate (!) 24, last menstrual period 06/22/2018, SpO2 99 %. Maternal Exam:   Abdomen: Fundal height is 35.   Fetal presentation: vertex     Physical Exam  Constitutional: She is oriented to person, place, and time. She appears well-developed and well-nourished.  HENT:  Head: Normocephalic.  Eyes: Pupils are equal, round, and reactive to light. Conjunctivae are normal.  Cardiovascular: Normal rate, regular rhythm and normal heart sounds.  Respiratory: Effort normal and breath sounds normal. No respiratory distress. She has no wheezes. She has no rales.  GI: Soft. Bowel sounds are normal. She exhibits no distension. There is no abdominal tenderness. There is no rebound and no guarding.  Neurological: She is alert and oriented to person, place, and time.  Skin: Skin is warm and dry.  Psychiatric: She has a normal mood and affect. Her behavior is normal. Judgment and thought content normal.    Prenatal labs: ABO, Rh: --/--/PENDING (03/03 2200) Antibody: PENDING (03/03 2200) Rubella: 1.85 (11/04 1131) RPR: Non Reactive (02/12 0904)  HBsAg: Negative (11/04 1131)  HIV: Non Reactive (02/12 0904)  GBS:     Assessment/Plan: 1. Superimposed Preeclampsia with Severe Features 2. CHTN 3. Lupus nephritis 4. [redacted] weeks Gestation 5. Pulmonary edema 6. GBS unkown 7. Anemia   Admit to L&D  As the patient has severe range BPs, requiring multiple doses of IV BP medications and has pulmonary edema, will proceed towards delivery.   As the patient has had 4 vaginal deliveries and the patient  is currently stable on room air, I believe that we are able to induce the patient safely.   I discussed the patient with Dr Ihor Dow, who agrees with induction  Will give lasix 20mg  now for pulm edema.  Mag started in [facility] ED at my request  Serial CMPs and mag levels  Patient cross matched for 4 units PRBCs  Will watch extremely closely.  Induce with foley balloon and pitocin    Truett Mainland 02/01/2019, 10:29 PM

## 2019-02-01 NOTE — ED Notes (Signed)
Rapid OB paged.

## 2019-02-02 ENCOUNTER — Inpatient Hospital Stay (HOSPITAL_COMMUNITY): Payer: Medicaid Other | Admitting: Anesthesiology

## 2019-02-02 ENCOUNTER — Encounter (HOSPITAL_COMMUNITY): Payer: Self-pay | Admitting: Obstetrics and Gynecology

## 2019-02-02 ENCOUNTER — Inpatient Hospital Stay (HOSPITAL_COMMUNITY): Payer: Medicaid Other

## 2019-02-02 ENCOUNTER — Other Ambulatory Visit: Payer: Self-pay

## 2019-02-02 DIAGNOSIS — I340 Nonrheumatic mitral (valve) insufficiency: Secondary | ICD-10-CM

## 2019-02-02 DIAGNOSIS — I129 Hypertensive chronic kidney disease with stage 1 through stage 4 chronic kidney disease, or unspecified chronic kidney disease: Secondary | ICD-10-CM | POA: Diagnosis present

## 2019-02-02 DIAGNOSIS — O1414 Severe pre-eclampsia complicating childbirth: Secondary | ICD-10-CM

## 2019-02-02 DIAGNOSIS — Z3A35 35 weeks gestation of pregnancy: Secondary | ICD-10-CM

## 2019-02-02 LAB — CBC
HCT: 22.7 % — ABNORMAL LOW (ref 36.0–46.0)
HCT: 24.2 % — ABNORMAL LOW (ref 36.0–46.0)
HCT: 19.2 % — ABNORMAL LOW (ref 36.0–46.0)
HEMATOCRIT: 21.7 % — AB (ref 36.0–46.0)
Hemoglobin: 6.3 g/dL — CL (ref 12.0–15.0)
Hemoglobin: 7.2 g/dL — ABNORMAL LOW (ref 12.0–15.0)
Hemoglobin: 7.5 g/dL — ABNORMAL LOW (ref 12.0–15.0)
Hemoglobin: 7.8 g/dL — ABNORMAL LOW (ref 12.0–15.0)
MCH: 30.6 pg (ref 26.0–34.0)
MCH: 30.7 pg (ref 26.0–34.0)
MCH: 30.8 pg (ref 26.0–34.0)
MCH: 30.9 pg (ref 26.0–34.0)
MCHC: 32.2 g/dL (ref 30.0–36.0)
MCHC: 32.8 g/dL (ref 30.0–36.0)
MCHC: 33 g/dL (ref 30.0–36.0)
MCHC: 33.2 g/dL (ref 30.0–36.0)
MCV: 92.7 fL (ref 80.0–100.0)
MCV: 93.4 fL (ref 80.0–100.0)
MCV: 93.7 fL (ref 80.0–100.0)
MCV: 94.9 fL (ref 80.0–100.0)
PLATELETS: 216 10*3/uL (ref 150–400)
Platelets: 183 10*3/uL (ref 150–400)
Platelets: 211 10*3/uL (ref 150–400)
Platelets: 224 10*3/uL (ref 150–400)
RBC: 2.05 MIL/uL — ABNORMAL LOW (ref 3.87–5.11)
RBC: 2.34 MIL/uL — ABNORMAL LOW (ref 3.87–5.11)
RBC: 2.43 MIL/uL — ABNORMAL LOW (ref 3.87–5.11)
RBC: 2.55 MIL/uL — AB (ref 3.87–5.11)
RDW: 14.3 % (ref 11.5–15.5)
RDW: 14.4 % (ref 11.5–15.5)
RDW: 14.3 % (ref 11.5–15.5)
RDW: 14.3 % (ref 11.5–15.5)
WBC: 10.1 10*3/uL (ref 4.0–10.5)
WBC: 10.9 10*3/uL — ABNORMAL HIGH (ref 4.0–10.5)
WBC: 8.2 10*3/uL (ref 4.0–10.5)
WBC: 9.9 10*3/uL (ref 4.0–10.5)
nRBC: 0.2 % (ref 0.0–0.2)
nRBC: 0.2 % (ref 0.0–0.2)
nRBC: 0.2 % (ref 0.0–0.2)
nRBC: 0.6 % — ABNORMAL HIGH (ref 0.0–0.2)

## 2019-02-02 LAB — COMPREHENSIVE METABOLIC PANEL
ALK PHOS: 10 U/L — AB (ref 38–126)
ALT: 13 U/L (ref 0–44)
ALT: 14 U/L (ref 0–44)
ALT: 12 U/L (ref 0–44)
AST: 18 U/L (ref 15–41)
AST: 23 U/L (ref 15–41)
AST: 20 U/L (ref 15–41)
Albumin: 2.2 g/dL — ABNORMAL LOW (ref 3.5–5.0)
Albumin: 2.2 g/dL — ABNORMAL LOW (ref 3.5–5.0)
Albumin: 1.9 g/dL — ABNORMAL LOW (ref 3.5–5.0)
Alkaline Phosphatase: 104 U/L (ref 38–126)
Alkaline Phosphatase: 106 U/L (ref 38–126)
Anion gap: 10 (ref 5–15)
Anion gap: 9 (ref 5–15)
Anion gap: 7 (ref 5–15)
BUN: 11 mg/dL (ref 6–20)
BUN: 12 mg/dL (ref 6–20)
BUN: 10 mg/dL (ref 6–20)
CALCIUM: 6.9 mg/dL — AB (ref 8.9–10.3)
CHLORIDE: 108 mmol/L (ref 98–111)
CHLORIDE: 106 mmol/L (ref 98–111)
CO2: 15 mmol/L — AB (ref 22–32)
CO2: 15 mmol/L — ABNORMAL LOW (ref 22–32)
CO2: 18 mmol/L — ABNORMAL LOW (ref 22–32)
Calcium: 7.7 mg/dL — ABNORMAL LOW (ref 8.9–10.3)
Calcium: 7.3 mg/dL — ABNORMAL LOW (ref 8.9–10.3)
Chloride: 106 mmol/L (ref 98–111)
Creatinine, Ser: 1.16 mg/dL — ABNORMAL HIGH (ref 0.44–1.00)
Creatinine, Ser: 1.16 mg/dL — ABNORMAL HIGH (ref 0.44–1.00)
Creatinine, Ser: 1.13 mg/dL — ABNORMAL HIGH (ref 0.44–1.00)
GFR calc Af Amer: >60 mL/min (ref >60)
GFR calc Af Amer: >60 mL/min (ref >60)
GFR calc Af Amer: >60 mL/min (ref >60)
GFR calc non Af Amer: >60 mL/min (ref >60)
GFR calc non Af Amer: >60 mL/min (ref >60)
GFR calc non Af Amer: >60 mL/min (ref >60)
Glucose, Bld: 102 mg/dL — ABNORMAL HIGH (ref 70–99)
Glucose, Bld: 119 mg/dL — ABNORMAL HIGH (ref 70–99)
Glucose, Bld: 124 mg/dL — ABNORMAL HIGH (ref 70–99)
POTASSIUM: 4.5 mmol/L (ref 3.5–5.1)
Potassium: 4.8 mmol/L (ref 3.5–5.1)
Potassium: 5 mmol/L (ref 3.5–5.1)
SODIUM: 133 mmol/L — AB (ref 135–145)
Sodium: 130 mmol/L — ABNORMAL LOW (ref 135–145)
Sodium: 131 mmol/L — ABNORMAL LOW (ref 135–145)
Total Bilirubin: 0.2 mg/dL — ABNORMAL LOW (ref 0.3–1.2)
Total Bilirubin: 0.1 mg/dL — ABNORMAL LOW (ref 0.3–1.2)
Total Bilirubin: 0.2 mg/dL — ABNORMAL LOW (ref 0.3–1.2)
Total Protein: 6.3 g/dL — ABNORMAL LOW (ref 6.5–8.1)
Total Protein: 6.6 g/dL (ref 6.5–8.1)
Total Protein: 5.5 g/dL — ABNORMAL LOW (ref 6.5–8.1)

## 2019-02-02 LAB — PREPARE RBC (CROSSMATCH)

## 2019-02-02 LAB — MAGNESIUM
Magnesium: 4 mg/dL — ABNORMAL HIGH (ref 1.7–2.4)
Magnesium: 6 mg/dL — ABNORMAL HIGH (ref 1.7–2.4)
Magnesium: 6.2 mg/dL (ref 1.7–2.4)
Magnesium: 6.3 mg/dL (ref 1.7–2.4)

## 2019-02-02 LAB — GROUP B STREP BY PCR: Group B strep by PCR: NEGATIVE

## 2019-02-02 LAB — OB RESULTS CONSOLE GBS: STREP GROUP B AG: NEGATIVE

## 2019-02-02 LAB — RPR: RPR Ser Ql: NONREACTIVE

## 2019-02-02 LAB — ECHOCARDIOGRAM COMPLETE

## 2019-02-02 MED ORDER — DIBUCAINE 1 % RE OINT: 1.0000 "application " | TOPICAL_OINTMENT | RECTAL | Status: DC | PRN

## 2019-02-02 MED ORDER — SIMETHICONE 80 MG PO CHEW
80.0000 mg | CHEWABLE_TABLET | ORAL | Status: DC | PRN
  Administered 2019-02-05 – 2019-02-06 (×3): 80 mg via ORAL
  Filled 2019-02-02 (×3): qty 1

## 2019-02-02 MED ORDER — NIFEDIPINE ER OSMOTIC RELEASE 30 MG PO TB24
30.0000 mg | ORAL_TABLET | Freq: Every day | ORAL | Status: DC
  Filled 2019-02-02: qty 1

## 2019-02-02 MED ORDER — ONDANSETRON HCL 4 MG/2ML IJ SOLN: 4.0000 mg | INTRAMUSCULAR | Status: DC | PRN

## 2019-02-02 MED ORDER — ERYTHROMYCIN 5 MG/GM OP OINT
TOPICAL_OINTMENT | OPHTHALMIC | Status: AC
  Filled 2019-02-02: qty 1

## 2019-02-02 MED ORDER — ACETAMINOPHEN 325 MG PO TABS
650.0000 mg | ORAL_TABLET | Freq: Once | ORAL | Status: AC
  Administered 2019-02-02: 650 mg via ORAL
  Filled 2019-02-02: qty 2

## 2019-02-02 MED ORDER — SENNOSIDES-DOCUSATE SODIUM 8.6-50 MG PO TABS: 2.0000 | ORAL_TABLET | ORAL | Status: DC

## 2019-02-02 MED ORDER — ACETAMINOPHEN 325 MG PO TABS
650.0000 mg | ORAL_TABLET | ORAL | Status: DC | PRN
  Administered 2019-02-03 – 2019-02-06 (×10): 650 mg via ORAL
  Filled 2019-02-02 (×11): qty 2

## 2019-02-02 MED ORDER — WITCH HAZEL-GLYCERIN EX PADS: 1.0000 "application " | MEDICATED_PAD | CUTANEOUS | Status: DC | PRN

## 2019-02-02 MED ORDER — BENZOCAINE-MENTHOL 20-0.5 % EX AERO: 1.0000 "application " | INHALATION_SPRAY | CUTANEOUS | Status: DC | PRN

## 2019-02-02 MED ORDER — BETAMETHASONE SOD PHOS & ACET 6 (3-3) MG/ML IJ SUSP
12.0000 mg | Freq: Once | INTRAMUSCULAR | Status: DC
  Filled 2019-02-02: qty 2

## 2019-02-02 MED ORDER — AZATHIOPRINE 50 MG PO TABS
50.0000 mg | ORAL_TABLET | Freq: Two times a day (BID) | ORAL | Status: DC
  Administered 2019-02-02: 50 mg via ORAL
  Filled 2019-02-02: qty 1

## 2019-02-02 MED ORDER — MAGNESIUM SULFATE 40 G IN LACTATED RINGERS - SIMPLE
1.0000 g/h | INTRAVENOUS | Status: DC
  Administered 2019-02-02: 1 g/h via INTRAVENOUS
  Filled 2019-02-02: qty 500

## 2019-02-02 MED ORDER — DIPHENHYDRAMINE HCL 25 MG PO CAPS
25.0000 mg | ORAL_CAPSULE | Freq: Once | ORAL | Status: AC
  Administered 2019-02-02: 25 mg via ORAL
  Filled 2019-02-02: qty 1

## 2019-02-02 MED ORDER — SODIUM CHLORIDE 0.9% IV SOLUTION
Freq: Once | INTRAVENOUS | Status: AC
  Administered 2019-02-02: 21:00:00 via INTRAVENOUS

## 2019-02-02 MED ORDER — BUTALBITAL-APAP-CAFFEINE 50-325-40 MG PO TABS
1.0000 | ORAL_TABLET | Freq: Once | ORAL | Status: AC | PRN
  Administered 2019-02-02: 1 via ORAL
  Filled 2019-02-02: qty 1

## 2019-02-02 MED ORDER — LACTATED RINGERS IV SOLN: INTRAVENOUS | Status: DC

## 2019-02-02 MED ORDER — LIDOCAINE HCL (PF) 1 % IJ SOLN
INTRAMUSCULAR | Status: DC | PRN
  Administered 2019-02-02 (×2): 3 mL via EPIDURAL

## 2019-02-02 MED ORDER — TRANEXAMIC ACID-NACL 1000-0.7 MG/100ML-% IV SOLN
1000.0000 mg | Freq: Once | INTRAVENOUS | Status: DC
  Administered 2019-02-02: 1000 mg via INTRAVENOUS
  Filled 2019-02-02: qty 100

## 2019-02-02 MED ORDER — DIPHENHYDRAMINE HCL 25 MG PO CAPS: 25.0000 mg | ORAL_CAPSULE | Freq: Four times a day (QID) | ORAL | Status: DC | PRN

## 2019-02-02 MED ORDER — LACTATED RINGERS IV SOLN
INTRAVENOUS | Status: DC
  Administered 2019-02-02: 18:00:00 via INTRAVENOUS

## 2019-02-02 MED ORDER — TRANEXAMIC ACID-NACL 1000-0.7 MG/100ML-% IV SOLN
1000.0000 mg | Freq: Once | INTRAVENOUS | Status: DC | PRN
  Filled 2019-02-02: qty 100

## 2019-02-02 MED ORDER — PRENATAL MULTIVITAMIN CH
1.0000 | ORAL_TABLET | Freq: Every day | ORAL | Status: DC
  Administered 2019-02-04 – 2019-02-05 (×2): 1 via ORAL
  Filled 2019-02-02 (×2): qty 1

## 2019-02-02 MED ORDER — COCONUT OIL OIL: 1.0000 "application " | TOPICAL_OIL | Status: DC | PRN

## 2019-02-02 MED ORDER — FOLIC ACID 1 MG PO TABS
1.0000 mg | ORAL_TABLET | Freq: Every day | ORAL | Status: DC
  Administered 2019-02-03 – 2019-02-06 (×4): 1 mg via ORAL
  Filled 2019-02-02 (×4): qty 1

## 2019-02-02 MED ORDER — NIFEDIPINE ER OSMOTIC RELEASE 30 MG PO TB24
30.0000 mg | ORAL_TABLET | Freq: Two times a day (BID) | ORAL | Status: DC
  Administered 2019-02-02 – 2019-02-05 (×7): 30 mg via ORAL
  Filled 2019-02-02 (×6): qty 1

## 2019-02-02 MED ORDER — NIFEDIPINE ER OSMOTIC RELEASE 30 MG PO TB24
30.0000 mg | ORAL_TABLET | Freq: Every day | ORAL | Status: DC
  Administered 2019-02-02: 30 mg via ORAL
  Filled 2019-02-02: qty 1

## 2019-02-02 MED ORDER — ONDANSETRON HCL 4 MG PO TABS: 4.0000 mg | ORAL_TABLET | ORAL | Status: DC | PRN

## 2019-02-02 MED ORDER — MEASLES, MUMPS & RUBELLA VAC IJ SOLR: 0.5000 mL | Freq: Once | INTRAMUSCULAR | Status: DC

## 2019-02-02 MED ORDER — TETANUS-DIPHTH-ACELL PERTUSSIS 5-2.5-18.5 LF-MCG/0.5 IM SUSP
0.5000 mL | Freq: Once | INTRAMUSCULAR | Status: DC
  Filled 2019-02-02: qty 0.5

## 2019-02-02 MED ORDER — AZATHIOPRINE 50 MG PO TABS
50.0000 mg | ORAL_TABLET | Freq: Two times a day (BID) | ORAL | Status: DC
  Administered 2019-02-02 – 2019-02-06 (×8): 50 mg via ORAL
  Filled 2019-02-02 (×9): qty 1

## 2019-02-02 MED ORDER — SODIUM CHLORIDE (PF) 0.9 % IJ SOLN
INTRAMUSCULAR | Status: DC | PRN
  Administered 2019-02-02: 12 mL/h via EPIDURAL

## 2019-02-02 MED ORDER — NIFEDIPINE ER OSMOTIC RELEASE 30 MG PO TB24: 30.0000 mg | ORAL_TABLET | Freq: Every day | ORAL | Status: DC

## 2019-02-02 MED ORDER — ZOLPIDEM TARTRATE 5 MG PO TABS: 5.0000 mg | ORAL_TABLET | Freq: Every evening | ORAL | Status: DC | PRN

## 2019-02-02 NOTE — Anesthesia Postprocedure Evaluation (Signed)
Anesthesia Post Note  Patient: Cathy [name]  Procedure(s) Performed: AN AD [place]     Patient location during evaluation: Mother Baby Anesthesia Type: Epidural Level of consciousness: awake Pain management: satisfactory to patient Vital Signs Assessment: post-procedure vital signs reviewed and stable Respiratory status: spontaneous breathing Cardiovascular status: stable Anesthetic complications: no    Last Vitals:  Vitals:   02/02/19 1800 02/02/19 1805  BP:  135/81  Pulse:  69  Resp: 16 16  Temp:  36.8 C  SpO2:  100%    Last Pain:  Vitals:   02/02/19 1805  TempSrc: Oral  PainSc:    Pain Goal: Patients Stated Pain Goal: 3 (02/02/19 1430)                 Casimer Lanius

## 2019-02-02 NOTE — Progress Notes (Signed)
12 lead EKG completed.

## 2019-02-02 NOTE — Anesthesia Preprocedure Evaluation (Signed)
Anesthesia Evaluation  Patient identified by MRN, date of birth, ID band Patient awake    Reviewed: Allergy & Precautions, H&P , Patient's Chart, lab work & pertinent test results  Airway Mallampati: II  TM Distance: >3 FB Neck ROM: full    Dental  (+) Dental Advisory Given   Pulmonary neg pulmonary ROS, asthma , Current Smoker,    Pulmonary exam normal breath sounds clear to auscultation       Cardiovascular negative cardio ROS   Rhythm:regular Rate:Normal     Neuro/Psych PSYCHIATRIC DISORDERS Anxiety Depression Bipolar Disorder negative neurological ROS     GI/Hepatic negative GI ROS, Neg liver ROS,   Endo/Other  negative endocrine ROS  Renal/GU Renal disease     Musculoskeletal   Abdominal   Peds  Hematology  (+) Blood dyscrasia, anemia ,   Anesthesia Other Findings   Reproductive/Obstetrics (+) Pregnancy                             Anesthesia Physical  Anesthesia Plan  ASA: III  Anesthesia Plan: Epidural   Post-op Pain Management:    Induction:   PONV Risk Score and Plan:   Airway Management Planned:   Additional Equipment:   Intra-op Plan:   Post-operative Plan:   Informed Consent: I have reviewed the patients History and Physical, chart, labs and discussed the procedure including the risks, benefits and alternatives for the proposed anesthesia with the patient or authorized representative who has indicated his/her understanding and acceptance.       Plan Discussed with:   Anesthesia Plan Comments:         Anesthesia Quick Evaluation

## 2019-02-02 NOTE — Anesthesia Procedure Notes (Signed)
Epidural Patient location during procedure: OB  Staffing Anesthesiologist: Nolon Nations, MD Performed: anesthesiologist   Preanesthetic Checklist Completed: patient identified, pre-op evaluation, timeout performed, IV checked, risks and benefits discussed and monitors and equipment checked  Epidural Patient position: sitting Prep: site prepped and draped and DuraPrep Patient monitoring: heart rate, continuous pulse ox and blood pressure Approach: midline Location: L2-L3 Injection technique: LOR air and LOR saline  Needle:  Needle type: Tuohy  Needle gauge: 17 G Needle length: 9 cm Needle insertion depth: 7 cm Catheter type: closed end flexible Catheter size: 19 Gauge Catheter at skin depth: 12 cm Test dose: negative  Assessment Sensory level: T8 Events: blood not aspirated, injection not painful, no injection resistance, negative IV test and no paresthesia  Additional Notes Reason for block:procedure for pain

## 2019-02-02 NOTE — Progress Notes (Signed)
Pt. Came to emergency department c/o SOB and CP. RROB contacted to come monitor patient. Pt. Labs drawn and cardiac monitor/FHR applied. Pt. BP elevated and OB provider [place] DO contacted. Preeclampsia labs drawn and Labetalol protocol started. FHR tracing CAT 1. Pt. Denies LOF and endorses good fetal movement. Pt. Started on magnesium sulfate and orders to transport to labor and delivery after CT scan. BP stable at time of transport. Report given to labor and delivery RN Tia Masker. FHR tracing on paper and put into medical records.

## 2019-02-02 NOTE — Lactation Note (Addendum)
This note was copied from a baby's chart. Lactation Consultation Note  Patient Name: Cathy [name] WVPXT'[id number] Date: 02/02/2019 Reason for consult: Initial assessment;Late-preterm 34-36.6wks;1st time breastfeeding  Initial visit of P5 mom in [facility] specialty care, baby is 45 hours old, del @ 35.1wks. Mom states she put baby to the breast at birth and baby did well, but mom hasn't felt like doing much since then. Baby has been bottle fed with formula today. Mom currently on Magnesium Sulfate and will be receiving blood transfusions during the night. Mom also with lupus and history of renal failure. Mom currently on Imuran, and Prednisone.  Mom states she attempted to breastfeed her other 4 children without success; she states they "wouldn't latch at all." Mom states she experienced painful breasts once her milk came in each time and used cabbage leaves on her breasts. Mom reports positive breast changes during this pregnancy.  Discussed LPTI feeding policy and expected behaviors of preterm infant. Reviewed methods to conserve baby's energy and to limit time at the breast to 15-45min and total feeding time to 30 minutes or less. Mom given green LPTI feeding guidelines. Reviewed feeding q3hrs and with feeding cues; discussed feeding cues/behaviors. Demonstrated breast massage and hand expression to mom. Glistening on nipple noted with hand expression by LC. Encouraged mom to hand express prior to pumping and/or putting baby to the breast. Mom with soft, compressible breast tissue and erect medium-sized nipple that inverts somewhat with compression. Discussed with mom the importance of stimulating her breasts at regular intervals, either by baby or DEBP. Encouraged to pump every 3 hours during the day and at least once at night. DEBP and supplies at bedside. Demonstrated DEBP assembly, turning pump to initiation setting and general pumping guidelines, dissassembly and cleaning of breast pump parts with wash  basins and soap. Instructed mom to save any amount collected and provide to infant via finger. Mom states she doesn't feel well enough to pump at this time but allowed LC to pump for few seconds to ensure correct fit of 32mm pump flanges. Mom given lactation services brochure and notified of IP/OP lactation services, and breast feeding support group available. Mom describes history of engorgement; reviewed prevention and treatment of engorgement. Stressed importance of removing milk when engorged by baby or DEBP. Mom given green handout with engorgement information.  Encouraged mom to pump and/or put the baby to the breast as soon as she felt well enough to do so. Encouraged to call [facility] for assistance.   Maternal Data Has patient been taught Hand Expression?: Yes(demonstrated) Does the patient have breastfeeding experience prior to this delivery?: Yes   Interventions Interventions: Breast feeding basics reviewed;Skin to skin;Breast massage;Hand express;Breast compression;Expressed milk;DEBP  Lactation Tools Discussed/Used Pump Review: Setup, frequency, and cleaning;Milk Storage Initiated by:: CEanes Date initiated:: 02/02/19   Consult Status Consult Status: Follow-up Date: 02/03/19 Follow-up type: In-patient   Cranston Neighbor 02/02/2019, 9:23 PM

## 2019-02-02 NOTE — Progress Notes (Signed)
LABOR PROGRESS NOTE  Cathy [name] is a 38 y.o. [id number] at [redacted]w[redacted]d  admitted for superimposed Pre-E with severe features.   Subjective: Strip note.   Objective: BP 137/76   Pulse 67   Temp 98 F (36.7 C) (Oral)   Resp (!) 21   LMP 06/22/2018   SpO2 99%  or  Vitals:   02/02/19 0600 02/02/19 0611 02/02/19 0630 02/02/19 0701  BP: (!) 160/97 (!) 154/94 126/74 137/76  Pulse: 71 66 65 67  Resp: 19 17 19  (!) 21  Temp:      TempSrc:      SpO2:   99%     Dilation: 4 Effacement (%): 50 Cervical Position: Posterior Station: -2 Presentation: Vertex Exam by:: Jack Quarto, rN FHT: baseline rate 110, moderate varibility, +acel, no decel Toco: q2-3 min   Labs: Lab Results  Component Value Date   WBC 10.1 02/02/2019   HGB 7.5 (L) 02/02/2019   HCT 22.7 (L) 02/02/2019   MCV 93.4 02/02/2019   PLT 224 02/02/2019    Patient Active Problem List   Diagnosis Date Noted  . Pulmonary edema 02/01/2019  . AMA (advanced maternal age) multigravida 35+ 01/12/2019  . Non compliance w medication regimen 01/12/2019  . Current chronic use of systemic steroids 01/12/2019  . Unwanted fertility 12/10/2018  . Anemia 10/12/2018  . Supervision of high risk pregnancy, antepartum 10/04/2018  . Lupus ([place]) 09/20/2018  . Supervision of pregnancy with grand multiparity in second trimester 09/20/2018  . Substance abuse ([place]) 09/20/2018  . Acute renal failure (ARF) ([place]) 09/20/2018  . MDD (major depressive disorder) 10/11/2015  . Asthma 09/22/2011    Assessment / Plan: 38 y.o. [id number] at [redacted]w[redacted]d here for IOL for severe superimposed Pre-E.   Severe Pre-E: BPs currently in moderate range. On Mg++. Monitor Mg level and kidney function q6h. Most recent Mg++ level within normal range at 6.0. Cr 1.16, only minimally increased from 1.09.   Labor: Induction. S/p FB. Continue to titrate Pitocin as needed, currently on 14 mu/min.  Fetal Wellbeing:  Cat I  Pain Control:  Epidural in place  Anticipated  MOD:  NSVD   Phill Myron, D.O. OB Fellow  02/02/2019, 7:29 AM

## 2019-02-02 NOTE — Discharge Summary (Signed)
Obstetrics Discharge Summary OB/GYN Faculty Practice   Patient Name: Cathy [name] DOB: Apr 04, 1981 MRN: [id number]  Date of admission: 02/01/2019 Delivering MD: Glenice Bow   Date of discharge: 02/06/2019  Admitting diagnosis: Pre-eclampsia in third trimester [O14.93] Hypertensive emergency [I16.1] [redacted] weeks gestation of pregnancy [Z3A.35] Anemia, unspecified type [D64.9] Intrauterine pregnancy: [redacted]w[redacted]d     Secondary diagnosis:   Active Problems:   Asthma   MDD (major depressive disorder)   Lupus ([place])   Anemia   Unwanted fertility   AMA (advanced maternal age) multigravida 35+   Non compliance w medication regimen   Current chronic use of systemic steroids   Pulmonary edema     Discharge diagnosis: Preterm Pregnancy delivered                                     Postpartum procedures: ECHO Nml EF, LA dilated, RA dilated, trivial Pericardial effusion, thickened mitral leaflet Complications: BP poorly controlled, postpartum Magnesium, pulmonary edema on CT  Outpatient Follow-Up: [ ]  BP check [ ]  ensure BTL completed postpartum or arrange for interval BTL [ ]  consider repeat CBC (chronic anemia)   Hospital course: Cathy [name] is a 38 y.o. [redacted]w[redacted]d who was admitted for induction of labor for superimposed preeclampsia with severe features by blood pressures and pulmonary edema. 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 Her labor course was notable for induction with FB and pitocin, AROM for light meconium, epidural placement, and progressing to complete. She was started on magnesium for severe preeclampsia on admission and required several IV labetalol pushes then was able to transition to oral Procardia XL 30mg . Delivery was complicated by NICU attendance for preterm, fetal bradycardia for several minutes with pushing, and bilateral hemostatic  periurethral lacerations. Please see delivery/op note for additional details. Her postpartum course was complicated by Magnesium Sulfate x 24 hour. Poor BP control. She underwent pp BTL on PPD #1 and had Filshie clips applied. Required multiple IV meds for BP control. Started on Procardia 30 mg xl bid, and still required IV medds to keep BP controlled. Discussed case with [place] and added Labetalol for BP control and increased Procardia.  By day of discharge, she was passing flatus, urinating, eating and drinking without difficulty. Her pain was well-controlled, and she was discharged home with outpatient f/u. She will follow-up in clinic in 1 week.   Physical exam  Vitals:   02/02/19 1245 02/02/19 1300 02/02/19 1315 02/02/19 1400  BP: (!) 164/96 (!) 157/101 (!) 152/47 (!) 164/91  Pulse: 71 69 93 70  Resp: (!) 21 (!) 22 20 18   Temp:      TempSrc:      SpO2: 100% 100% 100%    General: WD/WN Lochia: appropriate Uterine Fundus: firm DVT Evaluation: No evidence of DVT seen on physical exam. Labs: Lab Results  Component Value Date   WBC 9.9 02/02/2019   HGB 7.8 (L) 02/02/2019   HCT 24.2 (L) 02/02/2019   MCV 94.9 02/02/2019   PLT 216 02/02/2019   CMP Latest Ref Rng & Units 02/02/2019  Glucose 70 - 99 mg/dL 119(H)  BUN 6 - 20 mg/dL 12  Creatinine 0.44 - 1.00 mg/dL 1.16(H)  Sodium 135 - 145 mmol/L 130(L)  Potassium 3.5 - 5.1 mmol/L 4.8  Chloride 98 - 111 mmol/L  106  CO2 22 - 32 mmol/L 15(L)  Calcium 8.9 - 10.3 mg/dL 7.3(L)  Total Protein 6.5 - 8.1 g/dL 6.6  Total Bilirubin 0.3 - 1.2 mg/dL 0.1(L)  Alkaline Phos 38 - 126 U/L 106  AST 15 - 41 U/L 23  ALT 0 - 44 U/L 14    Discharge instructions: Per After Visit Summary and "Baby and Me Booklet"  After visit meds:  Allergies as of 02/06/2019   No Known Allergies     Medication List    TAKE these medications   aspirin EC 81 MG tablet Take 1 tablet (81 mg total) by mouth daily. Take after 12 weeks for prevention  of preeclampsia later in pregnancy   azaTHIOprine 50 MG tablet Commonly known as:  IMURAN Take 1 tablet (50 mg total) by mouth 2 (two) times daily.   butalbital-acetaminophen-caffeine 50-325-40 MG tablet Commonly known as:  FIORICET, ESGIC Take 1-2 tablets by mouth every 6 (six) hours as needed for headache.   cyanocobalamin 1000 MCG/ML injection Commonly known as:  (VITAMIN B-12) Inject weekly.   famotidine 20 MG tablet Commonly known as:  PEPCID Take 1 tablet (20 mg total) by mouth 2 (two) times daily.   folic acid 1 MG tablet Commonly known as:  FOLVITE Take 1 tablet (1 mg total) by mouth daily.   labetalol 300 MG tablet Commonly known as:  NORMODYNE Take 1 tablet (300 mg total) by mouth 2 (two) times daily.   NIFEdipine 60 MG 24 hr tablet Commonly known as:  ADALAT CC Take 1 tablet (60 mg total) by mouth 2 (two) times daily.   predniSONE 20 MG tablet Commonly known as:  DELTASONE Take 2 tablets (40 mg total) by mouth daily with breakfast.   prenatal multivitamin Tabs tablet Take 1 tablet by mouth daily at 12 noon.   promethazine 12.5 MG tablet Commonly known as:  PHENERGAN Take 1 tablet (12.5 mg total) by mouth every 6 (six) hours as needed for nausea or vomiting.   sodium bicarbonate 650 MG tablet Take 1 tablet (650 mg total) by mouth 2 (two) times daily.       Postpartum contraception: Tubal Ligation Diet: Routine Diet Activity: Advance as tolerated. Pelvic rest for 6 weeks.   Follow-up Appt: Future Appointments  Date Time Provider [place]  02/04/2019  1:50 PM [place] MFC-US  02/04/2019  2:00 PM [place] 3 WH-MFCUS MFC-US  02/11/2019  2:50 PM [place] MFC-US  02/11/2019  3:00 PM [place] 3 WH-MFCUS MFC-US   Follow-up Visit:No follow-ups on file.  Please schedule this patient for Postpartum visit in: 4 weeks with the following provider: MD/DO High risk pregnancy complicated by: lupus nephritis, cHTN Delivery mode:   SVD Anticipated Birth Control:  BTL to (hopefully) be done prior to discharge PP Procedures needed: BP check  Schedule Integrated [place] visit: yes  Newborn Data: Live born female  Birth Weight: 6 lb 2.9 oz (2805 g) APGAR: 9, 9  Newborn Delivery   Birth date/time:  02/02/2019 12:24:00 Delivery type:  Vaginal, Spontaneous     Baby Feeding: Bottle and Breast Disposition:home with mother  Lambert Mody. Juleen China, DO OB/GYN Fellow, Faculty Practice

## 2019-02-02 NOTE — Progress Notes (Signed)
Called to evaluate patient with hemoglobin 6.3. Patient found lying in bed and reports feeling week and dizzy while laying in bed. She has not gotten out of bed yet. She denies any chest pain and reports improvement in her shortness of breath in comparison to admission   Blood pressure 135/81, pulse 69, temperature 98.2 F (36.8 C), temperature source Oral, resp. rate (!) 22, last menstrual period 06/22/2018, SpO2 100 %, unknown if currently breastfeeding. GENERAL: Well-developed, well-nourished female in no acute distress.  LUNGS: Clear to auscultation bilaterally.  HEART: Regular rate and rhythm. ABDOMEN: Soft, nontender, fundus firm PELVIC: Appropriate lochia EXTREMITIES: No cyanosis, clubbing, or edema, 2+ distal pulses.  A/p 38 yo PPD# 0 with preeclampsia with severe features s/p vaginal delivery - Continue magnesium sulfate for seizure prophylaxis - Patient with history of chronic anemia but now symptomatic. Patient agrees to blood transfusion - Patient scheduled for repeat echo - Continue monitoring strict I&O - Plan for BTL in the morning

## 2019-02-02 NOTE — Progress Notes (Signed)
Results for MAYTAL, MIJANGOS (MRN [id number]) as of 02/02/2019 18:57  Ref. Range 02/02/2019 18:00  Hemoglobin Latest Ref Range: 12.0 - 15.0 g/dL 6.3 (LL)  Dr. Elly Modena notified of above lab value.  No orders received.

## 2019-02-02 NOTE — Progress Notes (Signed)
OB/GYN Faculty Practice: Labor Progress Note  Subjective: Very uncomfortable - having a headache, N/V and some chest discomfort.   Objective: BP (!) 144/89   Pulse 72   Temp (!) 97.5 F (36.4 C) (Oral)   Resp 19   LMP 06/22/2018   SpO2 99%  Gen: tired and uncomfortable appearing  Dilation: 4 Effacement (%): 50 Cervical Position: Posterior Station: -2 Presentation: Vertex Exam by:: Jack Quarto, rN  Assessment and Plan: 38 y.o. P1G9842 [redacted]w[redacted]d here for IOL for superimposed preeclampsia in setting of pulmonary edema, severe range blood pressures.   Labor: Induction started with FB and pitocin. Cervix with some interval change, had SROM about 3 hours ago but BBOW felt on exam. AROM light meconium, IUPC placed to better titrate pitocin.  -- continue to titrate pitocin (started at 2325 3/3)  -- pain control: epidural in place -- PPH Risk: high - 4U T&C given baseline HgB around 7.1, TXA to be given at time of delivery -- plans postpartum tubal while epidural in place (consent 12/10/18)  Fetal Well-Being: EFW 6lbs by Leopold's. Cephalic by sutures.  -- Category I - continuous fetal monitoring - FSE placed for better monitoring - lower baseline in 110s could potentially related to fetal heart block in setting of maternal (+)anti-Ro/[place] Antibodies  -- GBS unknown (PCR no culture available) - will continue pitocin  -- received BMZ 3/3 (2330)  Superimposed Preeclampsia with Severe Features: Presented overnight with severe range BP - required multiple IV pushes of labetalol per the protocol and then also hydralazine. Also with worsening shortness of breath - CXR with evidence of pulmonary edema. CTA performed without evidence of pulmonary embolism thought report does not limited evaluation of pulmonary arterial system because contrast, cardiomegaly noted with small moderate pericardial effusion, pulmonary edema and pleural effusions noted. Received 1 dose of IV lasix for good UOP. Cr stable  (baseline around 0.9 - 1.09>1.16), normal AST/ALT, platelets normal.  -- start procardia 30mg  XL  -- likely transfer to cardiac floor postpartum for ECHO, tele monitoring, presumed need for diuresis -- continue Mg++ - reduced rate because of kidney function  -- monitor UOP - adequate at this time, foley in place -- q6hrs labs  -- appreciate Dr. Ilda Basset discussing plan of care postpartum, see separate progress note   S. Juleen China, DO OB/GYN Fellow, Faculty Practice  9:33 AM

## 2019-02-02 NOTE — Progress Notes (Signed)
LABOR PROGRESS NOTE  ALEXSA FLAUM is a 38 y.o. Y2Q8250 at [redacted]w[redacted]d  admitted for superimposed Pre-E with severe features.   Subjective: Patient comfortable. Epidural in place. Support people at bedside.   Objective: BP (!) 142/79   Pulse 77   Temp 98.4 F (36.9 C)   Resp (!) 25   LMP 06/22/2018   SpO2 99%  or  Vitals:   02/02/19 0044 02/02/19 0050 02/02/19 0055 02/02/19 0130  BP: 137/88 137/84 138/84 (!) 142/79  Pulse: 72 73 71 77  Resp: 20 (!) 24 20 (!) 25  Temp:      TempSrc:      SpO2: 98%   99%    Dilation: 4 Effacement (%): 50 Cervical Position: Posterior Presentation: Vertex Exam by:: Sharyn Lull, RN  FHT: baseline rate 130, moderate varibility, +acel, no decel Toco: q2-3 min   Labs: Lab Results  Component Value Date   WBC 8.2 02/02/2019   HGB 7.2 (L) 02/02/2019   HCT 21.7 (L) 02/02/2019   MCV 92.7 02/02/2019   PLT 211 02/02/2019    Patient Active Problem List   Diagnosis Date Noted  . Pulmonary edema 02/01/2019  . AMA (advanced maternal age) multigravida 35+ 01/12/2019  . Non compliance w medication regimen 01/12/2019  . Current chronic use of systemic steroids 01/12/2019  . Unwanted fertility 12/10/2018  . Anemia 10/12/2018  . Supervision of high risk pregnancy, antepartum 10/04/2018  . Lupus ([place]) 09/20/2018  . Supervision of pregnancy with grand multiparity in second trimester 09/20/2018  . Substance abuse ([place]) 09/20/2018  . Acute renal failure (ARF) ([place]) 09/20/2018  . MDD (major depressive disorder) 10/11/2015  . Asthma 09/22/2011    Assessment / Plan: 38 y.o. I3B0488 at [redacted]w[redacted]d here for IOL for severe superimposed Pre-E.   Severe Pre-E: BPs currently in moderate range. On Mg++. Monitor Mg level and kidney function q6h.   Labor: Induction. S/p FB. Continue to titrate Pitocin as needed, currently on 6 mu/min.  Fetal Wellbeing:  Cat I  Pain Control:  Epidural in place  Anticipated MOD:  NSVD   Phill Myron, D.O. OB Fellow  02/02/2019,  2:02 AM

## 2019-02-02 NOTE — Progress Notes (Signed)
OB Note D/w Dr. Ellyn Hack of Cardiology re: any need for additional CV surveillance postpartum, such as an telemetry, and he recommends getting another echo to fully evaluate the effusion. Will leave on telemetry for now and echo ordered; pt had prior baseline one in October 2019  Aletha Halim, Brooke Bonito MD Attending Center for [employer] (Faculty Practice) 02/02/2019 Time: [phone]

## 2019-02-02 NOTE — Progress Notes (Signed)
  Echocardiogram 2D Echocardiogram has been performed.  Darlina Sicilian M 02/02/2019, 1:57 PM

## 2019-02-02 NOTE — Progress Notes (Signed)
OB Note  I spoke with Dr. Carolin Sicks (covering for Dr. Marval Regal who's on vacation) about Ms. Stegman about any issues intra and post partum to be aware of. Specifically: 1) need for stress dose steroids: he says none are needed and to continue her on her home prednisone and that the [place] course shoudn't be an issue 2) keep close watch of I/O 3) continue her on all her home meds 4) call with any questions  Durene Romans MD Attending Center for [place] (Faculty Practice) 02/02/2019 Time: [phone]

## 2019-02-03 ENCOUNTER — Inpatient Hospital Stay (HOSPITAL_COMMUNITY): Payer: Medicaid Other | Admitting: Certified Registered Nurse Anesthetist

## 2019-02-03 ENCOUNTER — Encounter (HOSPITAL_COMMUNITY)
Admission: EM | Disposition: A | Discharge status: Home / Self Care | Payer: Self-pay | Source: Home / Self Care | Attending: Obstetrics and Gynecology

## 2019-02-03 DIAGNOSIS — Z302 Encounter for sterilization: Secondary | ICD-10-CM

## 2019-02-03 DIAGNOSIS — Z01818 Encounter for other preprocedural examination: Secondary | ICD-10-CM

## 2019-02-03 DIAGNOSIS — N736 Female pelvic peritoneal adhesions (postinfective): Secondary | ICD-10-CM | POA: Clinically undetermined

## 2019-02-03 HISTORY — PX: TUBAL LIGATION: SHX77

## 2019-02-03 LAB — COMPREHENSIVE METABOLIC PANEL
ALT: 12 U/L (ref 0–44)
ALT: 12 U/L (ref 0–44)
ALT: 12 U/L (ref 0–44)
ANION GAP: 8 (ref 5–15)
ANION GAP: 6 (ref 5–15)
AST: 15 U/L (ref 15–41)
AST: 16 U/L (ref 15–41)
AST: 20 U/L (ref 15–41)
Albumin: 2.1 g/dL — ABNORMAL LOW (ref 3.5–5.0)
Albumin: 2.1 g/dL — ABNORMAL LOW (ref 3.5–5.0)
Albumin: 2.1 g/dL — ABNORMAL LOW (ref 3.5–5.0)
Alkaline Phosphatase: 100 U/L (ref 38–126)
Alkaline Phosphatase: 102 U/L (ref 38–126)
Alkaline Phosphatase: 102 U/L (ref 38–126)
Anion gap: 10 (ref 5–15)
BUN: 11 mg/dL (ref 6–20)
BUN: 11 mg/dL (ref 6–20)
BUN: 13 mg/dL (ref 6–20)
CALCIUM: 7.3 mg/dL — AB (ref 8.9–10.3)
CO2: 16 mmol/L — ABNORMAL LOW (ref 22–32)
CO2: 17 mmol/L — ABNORMAL LOW (ref 22–32)
CO2: 18 mmol/L — ABNORMAL LOW (ref 22–32)
Calcium: 7.2 mg/dL — ABNORMAL LOW (ref 8.9–10.3)
Calcium: 7.3 mg/dL — ABNORMAL LOW (ref 8.9–10.3)
Chloride: 107 mmol/L (ref 98–111)
Chloride: 107 mmol/L (ref 98–111)
Chloride: 108 mmol/L (ref 98–111)
Creatinine, Ser: 1.08 mg/dL — ABNORMAL HIGH (ref 0.44–1.00)
Creatinine, Ser: 1.18 mg/dL — ABNORMAL HIGH (ref 0.44–1.00)
Creatinine, Ser: 1.2 mg/dL — ABNORMAL HIGH (ref 0.44–1.00)
GFR calc Af Amer: >60 mL/min (ref >60)
GFR calc Af Amer: >60 mL/min (ref >60)
GFR calc Af Amer: >60 mL/min (ref >60)
GFR calc non Af Amer: 59 mL/min — ABNORMAL LOW (ref >60)
GFR calc non Af Amer: 60 mL/min — ABNORMAL LOW (ref >60)
GFR calc non Af Amer: >60 mL/min (ref >60)
GLUCOSE: 111 mg/dL — AB (ref 70–99)
GLUCOSE: 99 mg/dL (ref 70–99)
Glucose, Bld: 131 mg/dL — ABNORMAL HIGH (ref 70–99)
Potassium: 5.1 mmol/L (ref 3.5–5.1)
Potassium: 5.4 mmol/L — ABNORMAL HIGH (ref 3.5–5.1)
Potassium: 5.7 mmol/L — ABNORMAL HIGH (ref 3.5–5.1)
Sodium: 131 mmol/L — ABNORMAL LOW (ref 135–145)
Sodium: 133 mmol/L — ABNORMAL LOW (ref 135–145)
Sodium: 133 mmol/L — ABNORMAL LOW (ref 135–145)
Total Bilirubin: 0.1 mg/dL — ABNORMAL LOW (ref 0.3–1.2)
Total Bilirubin: 0.2 mg/dL — ABNORMAL LOW (ref 0.3–1.2)
Total Bilirubin: 0.3 mg/dL (ref 0.3–1.2)
Total Protein: 5.7 g/dL — ABNORMAL LOW (ref 6.5–8.1)
Total Protein: 5.8 g/dL — ABNORMAL LOW (ref 6.5–8.1)
Total Protein: 5.9 g/dL — ABNORMAL LOW (ref 6.5–8.1)

## 2019-02-03 LAB — CBC
HCT: 23.2 % — ABNORMAL LOW (ref 36.0–46.0)
HCT: 26 % — ABNORMAL LOW (ref 36.0–46.0)
HCT: 27.4 % — ABNORMAL LOW (ref 36.0–46.0)
Hemoglobin: 7.7 g/dL — ABNORMAL LOW (ref 12.0–15.0)
Hemoglobin: 8.8 g/dL — ABNORMAL LOW (ref 12.0–15.0)
Hemoglobin: 9.1 g/dL — ABNORMAL LOW (ref 12.0–15.0)
MCH: 29.8 pg (ref 26.0–34.0)
MCH: 30.7 pg (ref 26.0–34.0)
MCH: 31.2 pg (ref 26.0–34.0)
MCHC: 33.2 g/dL (ref 30.0–36.0)
MCHC: 33.2 g/dL (ref 30.0–36.0)
MCHC: 33.8 g/dL (ref 30.0–36.0)
MCV: 89.8 fL (ref 80.0–100.0)
MCV: 90.6 fL (ref 80.0–100.0)
MCV: 93.9 fL (ref 80.0–100.0)
PLATELETS: 184 10*3/uL (ref 150–400)
Platelets: 186 10*3/uL (ref 150–400)
Platelets: 186 10*3/uL (ref 150–400)
RBC: 2.87 MIL/uL — ABNORMAL LOW (ref 3.87–5.11)
RBC: 2.47 MIL/uL — AB (ref 3.87–5.11)
RBC: 3.05 MIL/uL — ABNORMAL LOW (ref 3.87–5.11)
RDW: 14.1 % (ref 11.5–15.5)
RDW: 15.4 % (ref 11.5–15.5)
RDW: 15.6 % — AB (ref 11.5–15.5)
WBC: 10.6 10*3/uL — ABNORMAL HIGH (ref 4.0–10.5)
WBC: 11.3 10*3/uL — ABNORMAL HIGH (ref 4.0–10.5)
WBC: 11.5 10*3/uL — ABNORMAL HIGH (ref 4.0–10.5)
nRBC: 0 % (ref 0.0–0.2)
nRBC: 0 % (ref 0.0–0.2)
nRBC: 0.2 % (ref 0.0–0.2)

## 2019-02-03 LAB — C4 COMPLEMENT: Complement C4, Body Fluid: 14 mg/dL (ref 14–44)

## 2019-02-03 LAB — URINE CULTURE

## 2019-02-03 LAB — MAGNESIUM
Magnesium: 5.8 mg/dL — ABNORMAL HIGH (ref 1.7–2.4)
Magnesium: 6 mg/dL — ABNORMAL HIGH (ref 1.7–2.4)
Magnesium: 6 mg/dL — ABNORMAL HIGH (ref 1.7–2.4)

## 2019-02-03 LAB — C3 COMPLEMENT: C3 Complement: 93 mg/dL (ref 82–167)

## 2019-02-03 SURGERY — LIGATION, FALLOPIAN TUBE, POSTPARTUM
Anesthesia: Epidural

## 2019-02-03 MED ORDER — HYDRALAZINE HCL 20 MG/ML IJ SOLN
10.0000 mg | Freq: Once | INTRAMUSCULAR | Status: AC
  Administered 2019-02-03: 10 mg via INTRAVENOUS

## 2019-02-03 MED ORDER — FENTANYL CITRATE (PF) 100 MCG/2ML IJ SOLN
INTRAMUSCULAR | Status: DC | PRN
  Administered 2019-02-03: 50 ug via INTRAVENOUS

## 2019-02-03 MED ORDER — BUPIVACAINE HCL (PF) 0.5 % IJ SOLN
INTRAMUSCULAR | Status: AC
  Filled 2019-02-03: qty 30

## 2019-02-03 MED ORDER — PROPOFOL 10 MG/ML IV BOLUS
INTRAVENOUS | Status: AC
  Filled 2019-02-03: qty 20

## 2019-02-03 MED ORDER — FENTANYL CITRATE (PF) 100 MCG/2ML IJ SOLN
INTRAMUSCULAR | Status: AC
  Filled 2019-02-03: qty 2

## 2019-02-03 MED ORDER — LABETALOL HCL 5 MG/ML IV SOLN
20.0000 mg | Freq: Once | INTRAVENOUS | Status: AC
  Administered 2019-02-03: 20 mg via INTRAVENOUS

## 2019-02-03 MED ORDER — PROPOFOL 10 MG/ML IV BOLUS
INTRAVENOUS | Status: DC | PRN
  Administered 2019-02-03: 10 mg via INTRAVENOUS

## 2019-02-03 MED ORDER — HYDROMORPHONE HCL 1 MG/ML IJ SOLN
INTRAMUSCULAR | Status: AC
  Filled 2019-02-03: qty 0.5

## 2019-02-03 MED ORDER — LABETALOL HCL 5 MG/ML IV SOLN
40.0000 mg | Freq: Once | INTRAVENOUS | Status: AC
  Administered 2019-02-03: 40 mg via INTRAVENOUS
  Filled 2019-02-03 (×2): qty 8

## 2019-02-03 MED ORDER — SODIUM CHLORIDE 0.9 % IR SOLN
Status: DC | PRN
  Administered 2019-02-03: 1

## 2019-02-03 MED ORDER — KETAMINE HCL 10 MG/ML IJ SOLN
INTRAMUSCULAR | Status: DC | PRN
  Administered 2019-02-03 (×3): 10 mg via INTRAVENOUS

## 2019-02-03 MED ORDER — MIDAZOLAM HCL 5 MG/5ML IJ SOLN
INTRAMUSCULAR | Status: DC | PRN
  Administered 2019-02-03 (×2): 1 mg via INTRAVENOUS

## 2019-02-03 MED ORDER — MIDAZOLAM HCL 2 MG/2ML IJ SOLN
INTRAMUSCULAR | Status: AC
  Filled 2019-02-03: qty 2

## 2019-02-03 MED ORDER — KETAMINE HCL 50 MG/5ML IJ SOSY
PREFILLED_SYRINGE | INTRAMUSCULAR | Status: AC
  Filled 2019-02-03: qty 5

## 2019-02-03 MED ORDER — MEPERIDINE HCL 25 MG/ML IJ SOLN: 6.2500 mg | INTRAMUSCULAR | Status: DC | PRN

## 2019-02-03 MED ORDER — SODIUM BICARBONATE 8.4 % IV SOLN
INTRAVENOUS | Status: DC
  Administered 2019-02-03: 09:00:00 via INTRAVENOUS
  Filled 2019-02-03 (×2): qty 150

## 2019-02-03 MED ORDER — LIDOCAINE-EPINEPHRINE (PF) 2 %-1:200000 IJ SOLN
INTRAMUSCULAR | Status: AC
  Filled 2019-02-03: qty 10

## 2019-02-03 MED ORDER — LABETALOL HCL 5 MG/ML IV SOLN
INTRAVENOUS | Status: AC
  Filled 2019-02-03: qty 4

## 2019-02-03 MED ORDER — BUPIVACAINE HCL 0.5 % IJ SOLN
INTRAMUSCULAR | Status: DC | PRN
  Administered 2019-02-03: 30 mL

## 2019-02-03 MED ORDER — LACTATED RINGERS IV SOLN
INTRAVENOUS | Status: DC | PRN
  Administered 2019-02-03: 14:00:00 via INTRAVENOUS

## 2019-02-03 MED ORDER — SODIUM BICARBONATE 8.4 % IV SOLN
INTRAVENOUS | Status: DC | PRN
  Administered 2019-02-03: 3 mL via EPIDURAL
  Administered 2019-02-03: 7 mL via EPIDURAL
  Administered 2019-02-03: 3 mL via EPIDURAL
  Administered 2019-02-03: 2 mL via EPIDURAL

## 2019-02-03 MED ORDER — HYDROMORPHONE HCL 1 MG/ML IJ SOLN
0.5000 mg | INTRAMUSCULAR | Status: DC | PRN
  Administered 2019-02-03 – 2019-02-05 (×9): 0.5 mg via INTRAVENOUS
  Filled 2019-02-03 (×9): qty 0.5

## 2019-02-03 MED ORDER — METOCLOPRAMIDE HCL 5 MG/ML IJ SOLN: 10.0000 mg | Freq: Once | INTRAMUSCULAR | Status: DC | PRN

## 2019-02-03 MED ORDER — LACTATED RINGERS IV SOLN: INTRAVENOUS | Status: DC

## 2019-02-03 MED ORDER — SODIUM BICARBONATE 650 MG PO TABS
650.0000 mg | ORAL_TABLET | Freq: Two times a day (BID) | ORAL | Status: DC
  Administered 2019-02-03 – 2019-02-06 (×7): 650 mg via ORAL
  Filled 2019-02-03 (×9): qty 1

## 2019-02-03 MED ORDER — HYDRALAZINE HCL 20 MG/ML IJ SOLN
INTRAMUSCULAR | Status: AC
  Filled 2019-02-03: qty 1

## 2019-02-03 MED ORDER — FENTANYL CITRATE (PF) 100 MCG/2ML IJ SOLN
25.0000 ug | INTRAMUSCULAR | Status: DC | PRN
  Administered 2019-02-03 (×3): 50 ug via INTRAVENOUS

## 2019-02-03 SURGICAL SUPPLY — 31 items
ADH SKN CLS APL DERMABOND .7 (GAUZE/BANDAGES/DRESSINGS) ×1
CLIP FILSHIE TUBAL LIGA STRL (Clip) ×2 IMPLANT
CLOSURE STERI-STRIP 1/4X4 (GAUZE/BANDAGES/DRESSINGS) ×2 IMPLANT
CLOTH BEACON ORANGE TIMEOUT ST (SAFETY) ×3 IMPLANT
DERMABOND ADVANCED (GAUZE/BANDAGES/DRESSINGS) ×2
DERMABOND ADVANCED .7 DNX12 (GAUZE/BANDAGES/DRESSINGS) ×1 IMPLANT
DRESSING OPSITE X SMALL 2X3 (GAUZE/BANDAGES/DRESSINGS) ×2 IMPLANT
DRSG OPSITE POSTOP 3X4 (GAUZE/BANDAGES/DRESSINGS) ×3 IMPLANT
DURAPREP 26ML APPLICATOR (WOUND CARE) ×3 IMPLANT
ELECT REM PT RETURN 9FT ADLT (ELECTROSURGICAL) ×3
ELECTRODE REM PT RTRN 9FT ADLT (ELECTROSURGICAL) ×1 IMPLANT
GLOVE BIO SURGEON STRL SZ7 (GLOVE) ×3 IMPLANT
GLOVE BIOGEL PI IND STRL 7.0 (GLOVE) ×1 IMPLANT
GLOVE BIOGEL PI IND STRL 7.5 (GLOVE) ×1 IMPLANT
GLOVE BIOGEL PI INDICATOR 7.0 (GLOVE) ×2
GLOVE BIOGEL PI INDICATOR 7.5 (GLOVE) ×2
GOWN STRL REUS W/TWL LRG LVL3 (GOWN DISPOSABLE) ×6 IMPLANT
NEEDLE HYPO 22GX1.5 SAFETY (NEEDLE) ×3 IMPLANT
NS IRRIG 1000ML POUR BTL (IV SOLUTION) ×3 IMPLANT
PACK ABDOMINAL MINOR (CUSTOM PROCEDURE TRAY) ×3 IMPLANT
PENCIL BUTTON HOLSTER BLD 10FT (ELECTRODE) IMPLANT
PROTECTOR NERVE ULNAR (MISCELLANEOUS) ×3 IMPLANT
SPONGE LAP 4X18 RFD (DISPOSABLE) ×3 IMPLANT
SUT MNCRL AB 4-0 PS2 18 (SUTURE) ×3 IMPLANT
SUT PLAIN 2 0 (SUTURE)
SUT PLAIN ABS 2-0 CT1 27XMFL (SUTURE) IMPLANT
SUT VICRYL 0 TIES 12 18 (SUTURE) IMPLANT
SUT VICRYL 0 UR6 27IN ABS (SUTURE) ×3 IMPLANT
SYR CONTROL 10ML LL (SYRINGE) ×3 IMPLANT
TOWEL OR 17X24 6PK STRL BLUE (TOWEL DISPOSABLE) ×6 IMPLANT
TRAY FOLEY CATH SILVER 14FR (SET/KITS/TRAYS/PACK) ×3 IMPLANT

## 2019-02-03 NOTE — Lactation Note (Signed)
This note was copied from a baby's chart. Lactation Consultation Note  Patient Name: Cathy [name] [id number]'[id number] Date: 02/03/2019  Baby is 21 hours old/4% weight loss.  Baby is receiving formula feeds by bottle.  Mom states she has not started pumping yet.  Pump has been set up and instructions completed.  Instructed to begin pumping every 3 hours to establish a good milk supply.  Encouraged to call for assist/concerns prn.   Maternal Data    Feeding Feeding Type: Bottle Fed - Formula  LATCH Score                   Interventions    Lactation Tools Discussed/Used     Consult Status      [name] 02/03/2019, 9:52 AM

## 2019-02-03 NOTE — Transfer of Care (Signed)
Immediate Anesthesia Transfer of Care Note  Patient: Cathy [name]  Procedure(s) Performed: POST PARTUM TUBAL LIGATION (N/A )  Patient Location: PACU  Anesthesia Type:Epidural  Level of Consciousness: awake, alert  and oriented  Airway & Oxygen Therapy: Patient Spontanous Breathing  Post-op Assessment: Report given to RN and Post -op Vital signs reviewed and stable  Post vital signs: Reviewed and stable  Last Vitals:  Vitals Value Taken Time  BP 139/92 02/03/2019  3:07 PM  Temp    Pulse 64 02/03/2019  3:12 PM  Resp 12 02/03/2019  3:12 PM  SpO2 99 % 02/03/2019  3:12 PM  Vitals shown include unvalidated device data.  Last Pain:  Vitals:   02/03/19 1330  TempSrc: Oral  PainSc:       Patients Stated Pain Goal: 3 (17/98/10 2548)  Complications: No apparent anesthesia complications

## 2019-02-03 NOTE — Anesthesia Preprocedure Evaluation (Signed)
Anesthesia Evaluation  Patient identified by MRN, date of birth, ID band Patient awake    Reviewed: Allergy & Precautions, NPO status , Patient's Chart, lab work & pertinent test results  Airway Mallampati: II  TM Distance: >3 FB Neck ROM: Full    Dental no notable dental hx.    Pulmonary Current Smoker,    Pulmonary exam normal breath sounds clear to auscultation       Cardiovascular hypertension, Normal cardiovascular exam Rhythm:Regular Rate:Normal     Neuro/Psych PSYCHIATRIC DISORDERS Anxiety Depression Bipolar Disorder negative neurological ROS     GI/Hepatic negative GI ROS, Neg liver ROS,   Endo/Other  negative endocrine ROS  Renal/GU Renal InsufficiencyRenal disease  negative genitourinary   Musculoskeletal Lupus Chronic steroids   Abdominal   Peds negative pediatric ROS (+)  Hematology negative hematology ROS (+)   Anesthesia Other Findings Non compliance  Reproductive/Obstetrics Pre-eclampsia with pulmonary edema                             Anesthesia Physical Anesthesia Plan  ASA: II  Anesthesia Plan: Epidural   Post-op Pain Management:    Induction:   PONV Risk Score and Plan: 1 and Treatment may vary due to age or medical condition  Airway Management Planned: Natural Airway  Additional Equipment:   Intra-op Plan:   Post-operative Plan:   Informed Consent: I have reviewed the patients History and Physical, chart, labs and discussed the procedure including the risks, benefits and alternatives for the proposed anesthesia with the patient or authorized representative who has indicated his/her understanding and acceptance.     Dental advisory given  Plan Discussed with: CRNA  Anesthesia Plan Comments: (existing labor epidural will be used for PPTL)        Anesthesia Quick Evaluation

## 2019-02-03 NOTE — Op Note (Signed)
Operative Note   02/03/2019  PRE-OP DIAGNOSIS: Desire for permanent sterilization.  Postpartum Day #1   POST-OP DIAGNOSIS: Same. Pelvic adhesive disease. Bilateral hydrosalpinges  SURGEON: Surgeon(s) and Role:    * Aletha Halim, MD - Primary  ASSISTANT: Katherine Basset, DO  ANESTHESIA: epidural and local  PROCEDURE: mini-laparotomy, bilateral tubal ligation via Filshie Clips method  ESTIMATED BLOOD LOSS: 31mL  DRAINS: indwelling foley  TOTAL IV FLUIDS: per anesthesia note  SPECIMENS:  None  VTE PROPHYLAXIS: SCDs to the bilateral lower extremities  ANTIBIOTICS: not indicated  COMPLICATIONS: adhesions in the bilateral adnexal regions and difficult analgesia  DISPOSITION: PACU - hemodynamically stable.  CONDITION: stable  FINDINGS: In the bilateral adnexal regions, filmy adhesions were noted and no ovaries were seen or palpated. I felt that I saw traces of the fimbriae on both sides and that the tubal structure I traced out was fallopian tube due to the anatomic position of it. I was unable to get a great view due to patient movement due to issues with analgesia. I also was unable to do a parkland or a p[place]oy and send off the tubal segments to pathology due to patient movement.  Smooth, normally contoured uterine fundus   PROCEDURE IN DETAIL: The patient was taken to the OR where anesthesia was administed. The patient was positioned in dorsal supine. The patient was prepped and draped in the normal sterile fashion a 4cm horizontal incision was made at the infraumbilical fold, after injection with local anesthesia. The skin was then incised with the scalpel and the underlying tissue dissected with the bovie and the fascia nicked in the midline with the scalpel and then extended laterally sharply.  The abdomen was then entered bluntly and a moist lap sponge used to displace the bowel.   The left Fallopian tube was identified by tracing out to what looked like the fimbriae. I found  an avascular place and also a place that was affected by hydrosalpingx that was approximately 3-4cm from the cornua was grasped with the babcock clamps and the filshie clip was applied, taking care to incorporate the entire tube.  Attention was then turned to the right fallopian tube where I also saw a hydrosalpingx and the same procedure was performed on the right Fallopian tube, with excellent hemostasis was noted from both BTL sites.   The lap sponge was then removed from the abdomen and the fascia closed in running fashion with 0 vicryl and The skin was then closed with 4-0 monocryl and steri strips. Approximately 89mL of local (0.5% marcaine) was injected at the surgical site.     The patient tolerated the procedure well. All counts were correct x 2. The patient was transferred to the recovery room awake, alert and breathing independently.  **I TOLD THE PATIENT THAT I RECOMMEND A POSTPARTUM HSG TO CONFIRM OCCULUSION OF THE TUBES**  My office will set this up for her   Durene Romans MD Attending Center for [employer])

## 2019-02-03 NOTE — Progress Notes (Signed)
Daily Antepartum Note  Admission Date: 02/01/2019 Current Date: 02/03/2019 8:56 AM  Cathy [name] is a 38 y.o. [id number] PPD#1 SVD/intact perineum @ 35wks. Patient admitted for severe pre-eclampsia (BPs).  Pregnancy complicated by: Patient Active Problem List   Diagnosis Date Noted  . Renal hypertension 02/02/2019  . Pulmonary edema 02/01/2019  . AMA (advanced maternal age) multigravida 35+ 01/12/2019  . Non compliance w medication regimen 01/12/2019  . Current chronic use of systemic steroids 01/12/2019  . Unwanted fertility 12/10/2018  . Anemia 10/12/2018  . Supervision of high risk pregnancy, antepartum 10/04/2018  . Lupus ([place]) 09/20/2018  . Supervision of pregnancy with grand multiparity in second trimester 09/20/2018  . Substance abuse ([place]) 09/20/2018  . Acute renal failure (ARF) ([place]) 09/20/2018  . MDD (major depressive disorder) 10/11/2015  . Asthma 09/22/2011    Overnight/24hr events:  Negative ECG last night. Patient received 2U PRBCs for symptomatic anemia  Subjective:  No chest pain, sob. +flatus. Got up and went to the bathroom with RN and no s/s of anemia. Lochia normal.   Objective:     Current Vital Signs 24h Vital Sign Ranges  T 98.1 F (36.7 C) Temp  Avg: 98 F (36.7 C)  Min: 97.7 F (36.5 C)  Max: 98.3 F (36.8 C)  BP 126/71 BP  Min: 126/71  Max: 166/106  HR 85 Pulse  Avg: 72.7  Min: 61  Max: 93  RR 17 Resp  Avg: 18.9  Min: 12  Max: 31  SaO2 100 % Room Air SpO2  Avg: 99.6 %  Min: 97 %  Max: 100 %       24 Hour I/O Current Shift I/O  Time Ins Outs 03/04 0701 - 03/05 0700 In: 2869.9 [P.O.:1485; I.V.:394.3] Out: 5081 [Urine:4625] No intake/output data recorded.   Patient Vitals for the past 24 hrs:  BP Temp Temp src Pulse Resp SpO2  02/03/19 0600 - - - - 17 -  02/03/19 0500 - - - - 17 -  02/03/19 0421 126/71 98.1 F (36.7 C) Oral 85 17 -  02/03/19 0400 - - - - (!) 21 -  02/03/19 0300 - - - - 17 -  02/03/19 0200 - - - - 18 -  02/03/19 0130  (!) 153/93 98 F (36.7 C) Oral 73 17 -  02/03/19 0100 (!) 150/94 98 F (36.7 C) Oral 67 19 -  02/03/19 0045 - - - - 20 -  02/03/19 0040 - - - - 20 -  02/03/19 0030 - - - - (!) 21 -  02/02/19 2300 - - - - 16 -  02/02/19 2200 - - - - (!) 21 -  02/02/19 2140 133/87 98 F (36.7 C) - 75 19 -  02/02/19 2100 132/84 - - 75 17 -  02/02/19 2000 - - - - (!) 22 -  02/02/19 1900 - - - - 16 -  02/02/19 1855 - - - - (!) 31 -  02/02/19 1850 - - - - 17 100 %  02/02/19 1845 - - - - 17 -  02/02/19 1840 - - - - (!) 21 -  02/02/19 1835 - - - - (!) 22 99 %  02/02/19 1830 - - - - (!) 22 99 %  02/02/19 1820 - - - - (!) 26 100 %  02/02/19 1815 - - - - 17 99 %  02/02/19 1810 - - - - 12 99 %  02/02/19 1805 135/81 98.2 F (36.8 C) Oral 69 16  100 %  02/02/19 1800 - - - - 16 -  02/02/19 1755 - - - - 17 -  02/02/19 1750 - - - - 19 -  02/02/19 1745 - - - - 19 -  02/02/19 1740 - - - - 18 -  02/02/19 1735 - - - - (!) 21 -  02/02/19 1730 - - - - 14 -  02/02/19 1725 - - - - 19 -  02/02/19 1720 - - - - 20 -  02/02/19 1715 - - - - 18 -  02/02/19 1710 - - - - 18 98 %  02/02/19 1705 - - - - (!) 21 100 %  02/02/19 1700 - - - - (!) 21 99 %  02/02/19 1657 - - - - (!) 25 -  02/02/19 1656 - - - - (!) 24 -  02/02/19 1655 - - - - (!) 23 100 %  02/02/19 1654 - - - - (!) 21 -  02/02/19 1653 - - - - 19 -  02/02/19 1652 - - - - (!) 21 -  02/02/19 1651 - - - - (!) 24 -  02/02/19 1650 - - - - 20 100 %  02/02/19 1649 - - - - (!) 24 -  02/02/19 1648 - - - - (!) 22 -  02/02/19 1647 - - - - 17 -  02/02/19 1646 - - - - 13 -  02/02/19 1645 - - - - 20 100 %  02/02/19 1644 - - - - 20 -  02/02/19 1643 - - - - 20 -  02/02/19 1642 - - - - 18 -  02/02/19 1641 - - - - 17 -  02/02/19 1640 - - - - 19 -  02/02/19 1639 - - - - 17 -  02/02/19 1638 (!) 141/97 - - - 19 -  02/02/19 1637 - - - - (!) 22 -  02/02/19 1634 - - - - (!) 23 -  02/02/19 1633 - - - - 17 -  02/02/19 1632 - - - - 14 -  02/02/19 1630 - - - - 15 -  02/02/19  1629 - - - - (!) 22 -  02/02/19 1628 - - - - 16 -  02/02/19 1627 - - - - 18 -  02/02/19 1626 - - - - 16 -  02/02/19 1623 - - - - 20 -  02/02/19 1622 - - - - (!) 31 -  02/02/19 1621 - - - - 14 -  02/02/19 1620 - - - - 17 -  02/02/19 1619 - - - - 18 -  02/02/19 1618 (!) 141/97 - - 64 14 -  02/02/19 1617 - - - - 13 -  02/02/19 1616 - - - - 13 -  02/02/19 1615 - - - - 19 -  02/02/19 1614 - - - - 17 -  02/02/19 1613 - - - - 16 -  02/02/19 1612 - - - - (!) 22 -  02/02/19 1611 - - - - 17 -  02/02/19 1610 - - - - 20 98 %  02/02/19 1609 - - - - 19 -  02/02/19 1608 - - - - (!) 24 -  02/02/19 1607 - - - - 17 -  02/02/19 1606 - - - - (!) 21 -  02/02/19 1605 - - - - (!) 22 -  02/02/19 1604 - - - - (!) 23 -  02/02/19 1603 - - - - (!) 22 -    02/02/19 1602 - - - - (!) 26 -  02/02/19 1601 - - - - (!) 27 -  02/02/19 1600 - - - - 18 -  02/02/19 1555 - - - - 18 -  02/02/19 1550 (!) 165/94 98.3 F (36.8 C) Oral 70 18 98 %  02/02/19 1545 (!) 155/110 - - 78 (!) 24 -  02/02/19 1540 - - - - 16 -  02/02/19 1535 - - - - 15 -  02/02/19 1530 - - - - (!) 23 99 %  02/02/19 1525 - - - - 17 99 %  02/02/19 1520 - - - - 17 99 %  02/02/19 1515 - - - - 19 99 %  02/02/19 1510 - - - - 18 100 %  02/02/19 1505 - - - - 17 100 %  02/02/19 1504 - - - - 18 -  02/02/19 1503 - - - - 16 -  02/02/19 1502 - - - - 16 -  02/02/19 1501 - - - - 18 -  02/02/19 1500 - - - - 17 100 %  02/02/19 1459 - - - - 20 -  02/02/19 1455 - - - - (!) 23 100 %  02/02/19 1450 - - - - 16 100 %  02/02/19 1445 - - - - 17 100 %  02/02/19 1442 (!) 155/95 - - 68 18 -  02/02/19 1440 - - - - (!) 24 100 %  02/02/19 1435 (!) 166/106 - - 79 19 100 %  02/02/19 1425 - - - - 20 -  02/02/19 1420 - - - - 18 -  02/02/19 1415 - - - - 17 -  02/02/19 1410 - - - - (!) 21 -  02/02/19 1405 - - - - 18 -  02/02/19 1400 (!) 164/91 - - 70 18 -  02/02/19 1355 - - - - 14 -  02/02/19 1350 - - - - 17 -  02/02/19 1345 (!) 160/96 - - 70 20 -  02/02/19 1340 - -  - 66 18 100 %  02/02/19 1335 - - - 61 17 100 %  02/02/19 1330 (!) 145/94 - - 64 13 99 %  02/02/19 1325 - - - 71 19 100 %  02/02/19 1320 - - - 71 13 97 %  02/02/19 1315 (!) 152/47 - - 93 20 100 %  02/02/19 1310 - - - 75 19 100 %  02/02/19 1305 - - - 78 (!) 23 100 %  02/02/19 1300 (!) 157/101 - - 69 (!) 22 100 %  02/02/19 1245 (!) 164/96 - - 71 (!) 21 100 %  02/02/19 1215 - - - 87 (!) 25 100 %  02/02/19 1200 (!) 145/93 - - 80 (!) 24 -  02/02/19 1152 - - - 77 18 100 %  02/02/19 1130 127/71 - - 70 14 100 %  02/02/19 1100 (!) 140/93 - - 66 16 100 %  02/02/19 1030 (!) 145/92 - - 64 12 100 %  02/02/19 1023 - - - 67 16 100 %  02/02/19 1010 - - - 73 (!) 21 100 %  02/02/19 1005 - - - 78 (!) 24 99 %  02/02/19 1002 (!) 145/95 97.7 F (36.5 C) Oral 70 14 -  02/02/19 1000 - - - 72 12 99 %  02/02/19 0930 (!) 159/94 - - 69 16 100 %  02/02/19 0923 - - - 83 17 100 %  02/02/19 0911 - - - - (!) 23  100 %  02/02/19 0902 (!) 144/89 - - 72 19 -  02/02/19 0900 - - - - 16 -   UOP: >122mL/hr  Physical exam: General: Well nourished, well developed female in no acute distress. Abdomen: nttp, mild to moderately distended Cardiovascular: S1, S2 normal, no murmur, rub or gallop, regular rate and rhythm Respiratory: CTAB Skin: Warm and dry.   Medications: Current Facility-Administered Medications  Medication Dose Route Frequency Provider Last Rate Last Dose  . acetaminophen (TYLENOL) tablet 650 mg  650 mg Oral Q4H PRN Aletha Halim, MD   650 mg at 02/03/19 0720  . azaTHIOprine (IMURAN) tablet 50 mg  50 mg Oral BID Aletha Halim, MD   50 mg at 02/02/19 2148  . benzocaine-Menthol (DERMOPLAST) 20-0.5 % topical spray 1 application  1 application Topical PRN Aletha Halim, MD      . coconut oil  1 application Topical PRN Aletha Halim, MD      . witch hazel-glycerin (TUCKS) pad 1 application  1 application Topical PRN Aletha Halim, MD       And  . dibucaine (NUPERCAINAL) 1 % rectal ointment 1  application  1 application Rectal PRN Aletha Halim, MD      . diphenhydrAMINE (BENADRYL) capsule 25 mg  25 mg Oral Q6H PRN Aletha Halim, MD      . folic acid (FOLVITE) tablet 1 mg  1 mg Oral Daily Jasir Rother, Eduard Clos, MD      . labetalol (NORMODYNE,TRANDATE) injection 20 mg  20 mg Intravenous PRN Aletha Halim, MD   20 mg at 02/02/19 1606   And  . labetalol (NORMODYNE,TRANDATE) injection 40 mg  40 mg Intravenous PRN Aletha Halim, MD   40 mg at 02/01/19 2049   And  . labetalol (NORMODYNE,TRANDATE) injection 80 mg  80 mg Intravenous PRN Aletha Halim, MD   80 mg at 02/01/19 2101   And  . hydrALAZINE (APRESOLINE) injection 10 mg  10 mg Intravenous PRN Aletha Halim, MD   10 mg at 02/01/19 2335  . magnesium sulfate 40 grams in LR 500 mL OB infusion  1 g/hr Intravenous Titrated Aletha Halim, MD 12.5 mL/hr at 02/03/19 0400 1 g/hr at 02/03/19 0400  . NIFEdipine (PROCARDIA-XL/NIFEDICAL-XL) 24 hr tablet 30 mg  30 mg Oral BID Aletha Halim, MD   30 mg at 02/02/19 2148  . ondansetron (ZOFRAN) tablet 4 mg  4 mg Oral Q4H PRN Aletha Halim, MD       Or  . ondansetron (ZOFRAN) injection 4 mg  4 mg Intravenous Q4H PRN Aletha Halim, MD      . predniSONE (DELTASONE) tablet 40 mg  40 mg Oral Q breakfast Aletha Halim, MD   40 mg at 02/02/19 1730  . prenatal multivitamin tablet 1 tablet  1 tablet Oral Q1200 Aletha Halim, MD      . simethicone (MYLICON) chewable tablet 80 mg  80 mg Oral PRN Aletha Halim, MD      . sodium bicarbonate 150 mEq in dextrose 5 % 1,000 mL infusion   Intravenous Continuous Aletha Halim, MD      . sodium bicarbonate tablet 650 mg  650 mg Oral BID Aletha Halim, MD      . Tdap Durwin Reges) injection 0.5 mL  0.5 mL Intramuscular Once Aletha Halim, MD        Labs:  Recent Labs  Lab 02/02/19 1800 02/03/19 0003 02/03/19 0642  WBC 10.9* 11.5* 11.3*  HGB 6.3* 7.7* 8.8*  HCT 19.2* 23.2* 26.0*  PLT 183 186  186    Recent Labs  Lab  02/02/19 1800 02/03/19 0003 02/03/19 0642  NA 131* 131* 133*  K 5.0 5.7* 5.4*  CL 106 108 107  CO2 18* 17* 16*  BUN 10 13 11   CREATININE 1.13* 1.20* 1.18*  CALCIUM 6.9* 7.3* 7.3*  PROT 5.5* 5.7* 5.8*  BILITOT 0.2* 0.3 0.1*  ALKPHOS 10* 102 102  ALT 12 12 12   AST 20 20 15   GLUCOSE 124* 131* 111*     Radiology: no new imaging  Assessment & Plan:  Pt stable *PP: routine care. F/u labs at 11am and hopefully can do postpartum BTL. Will d/w anesthesia. Pt NPO *Renal: stable Cr and great UOP. D/w with Renal Dr. Carolin Sicks and will d/c LR MIVF and switch to D5 with sodium bicarb. Pt also wasn't started on her PO bicarb at home and this was ordered.  *Lupus: continue with current regimen *HTN: continue with procardia bid *Anemia: improved. Has 2 more units on hold.  *PPx: SCDs *FEN/GI: NPO. See above *Dispo: likely PPD#2 or 3  Durene Romans. MD Attending Center for [facility])

## 2019-02-03 NOTE — Progress Notes (Signed)
Dr. Ihor Dow notified of BP in PACU 165/111, 152/96, 171/10/, 166/115, 158/109. Pacu administered 20 and 40 of Labetalol and 10mg  of Apresoline. 80mg  of Labetalol just now given on the floor. No new orders given. Will continue to monitor. Toya Smothers, RN

## 2019-02-03 NOTE — Progress Notes (Deleted)
Dr. Ihor Dow notified of pt's BPs 165/111, 152/96, 171/109, 166/115, 158/109. No new order given. Labetalol 20 and 40 and Apresoline given in the PACU. Toya Smothers, RN

## 2019-02-03 NOTE — Anesthesia Postprocedure Evaluation (Signed)
Anesthesia Post Note  Patient: [name]  Procedure(s) Performed: POST PARTUM TUBAL LIGATION (N/A )     Patient location during evaluation: PACU Anesthesia Type: Epidural Level of consciousness: awake and alert Pain management: pain level controlled Vital Signs Assessment: post-procedure vital signs reviewed and stable Respiratory status: spontaneous breathing and respiratory function stable Cardiovascular status: blood pressure returned to baseline and stable Postop Assessment: no headache, no backache, no apparent nausea or vomiting and epidural receding Anesthetic complications: no    Last Vitals:  Vitals:   02/03/19 1720 02/03/19 1735  BP:    Pulse: 65   Resp: 17   Temp:    SpO2: 100% 100%    Last Pain:  Vitals:   02/03/19 1715  TempSrc:   PainSc: Asleep   Pain Goal: Patients Stated Pain Goal: 3 (02/03/19 0845)  LLE Motor Response: Purposeful movement (02/03/19 1700)   RLE Motor Response: Purposeful movement (02/03/19 1700)       Epidural/Spinal Function Cutaneous sensation: Able to Wiggle Toes (02/03/19 1715), Patient able to flex knees: Yes (02/03/19 1715), Patient able to lift hips off bed: No (02/03/19 1715), Back pain beyond tenderness at insertion site: No (02/03/19 1715), Progressively worsening motor and/or sensory loss: No (02/03/19 1715), Bowel and/or bladder incontinence post epidural: No (02/03/19 1715)  Montez Hageman

## 2019-02-04 ENCOUNTER — Ambulatory Visit (HOSPITAL_COMMUNITY): Payer: Medicaid Other

## 2019-02-04 ENCOUNTER — Other Ambulatory Visit (HOSPITAL_COMMUNITY): Payer: Self-pay

## 2019-02-04 LAB — CBC
HCT: 29.4 % — ABNORMAL LOW (ref 36.0–46.0)
Hemoglobin: 9.7 g/dL — ABNORMAL LOW (ref 12.0–15.0)
MCH: 30.2 pg (ref 26.0–34.0)
MCHC: 33 g/dL (ref 30.0–36.0)
MCV: 91.6 fL (ref 80.0–100.0)
NRBC: 0 % (ref 0.0–0.2)
Platelets: 225 10*3/uL (ref 150–400)
RBC: 3.21 MIL/uL — ABNORMAL LOW (ref 3.87–5.11)
RDW: 15.9 % — ABNORMAL HIGH (ref 11.5–15.5)
WBC: 10.1 10*3/uL (ref 4.0–10.5)

## 2019-02-04 LAB — COMPREHENSIVE METABOLIC PANEL
ALT: 12 U/L (ref 0–44)
AST: 14 U/L — AB (ref 15–41)
Albumin: 2.2 g/dL — ABNORMAL LOW (ref 3.5–5.0)
Alkaline Phosphatase: 95 U/L (ref 38–126)
Anion gap: 7 (ref 5–15)
BUN: 15 mg/dL (ref 6–20)
CHLORIDE: 106 mmol/L (ref 98–111)
CO2: 20 mmol/L — AB (ref 22–32)
Calcium: 7.8 mg/dL — ABNORMAL LOW (ref 8.9–10.3)
Creatinine, Ser: 1.17 mg/dL — ABNORMAL HIGH (ref 0.44–1.00)
GFR calc Af Amer: >60 mL/min (ref >60)
GFR calc non Af Amer: >60 mL/min (ref >60)
Glucose, Bld: 93 mg/dL (ref 70–99)
Potassium: 4.4 mmol/L (ref 3.5–5.1)
SODIUM: 133 mmol/L — AB (ref 135–145)
Total Bilirubin: 0.2 mg/dL — ABNORMAL LOW (ref 0.3–1.2)
Total Protein: 5.9 g/dL — ABNORMAL LOW (ref 6.5–8.1)

## 2019-02-04 MED ORDER — NIFEDIPINE 10 MG PO CAPS: 20.0000 mg | ORAL_CAPSULE | ORAL | Status: DC | PRN

## 2019-02-04 MED ORDER — NIFEDIPINE 10 MG PO CAPS
10.0000 mg | ORAL_CAPSULE | ORAL | Status: DC | PRN
  Administered 2019-02-04 (×2): 10 mg via ORAL
  Filled 2019-02-04 (×2): qty 1

## 2019-02-04 MED ORDER — LABETALOL HCL 5 MG/ML IV SOLN
40.0000 mg | INTRAVENOUS | Status: DC | PRN
  Administered 2019-02-05: 40 mg via INTRAVENOUS
  Filled 2019-02-04: qty 8

## 2019-02-04 NOTE — Lactation Note (Signed)
This note was copied from a baby's chart. Lactation Consultation Note  Patient Name: Cathy [name] [id number]'Y Date: 02/04/2019 Reason for consult: Follow-up assessment;Late-preterm 34-36.6wks Mom has not initiated pumping yet because "milk is not flowing".  Discussed supply and demand and stressed importance of pumping every to 3 hours to establish and maintain her milk supply.  Baby is currently being bottle fed by mother.  Mom requesting a WIC pump referral.  Referral faxed to [facility] office.  Maternal Data    Feeding Feeding Type: Bottle Fed - Formula Nipple Type: Extra Slow Flow  LATCH Score                   Interventions    Lactation Tools Discussed/Used     Consult Status Consult Status: Follow-up Date: 02/05/19 Follow-up type: In-patient    [name] 02/04/2019, 11:29 AM

## 2019-02-04 NOTE — Clinical Social Work Maternal (Signed)
CLINICAL SOCIAL WORK MATERNAL/CHILD NOTE  Patient Details  Name: Cathy [name] MRN: [id number] Date of Birth: 07/05/1981  Date:  02/04/2019  Clinical Social Worker Initiating Note:  Laurey Arrow Date/Time: Initiated:  02/04/19/1510     Child's Name:  Cathy [name]   Biological Parents:  Mother, Father(FOB is Cathy [name] 8/?/1989)   Need for Interpreter:  None   Reason for Referral:  Homelessness, Behavioral Health Concerns, Current Substance Use/Substance Use During Pregnancy    Address:  [name] [address]    Phone number:  [phone] (home)     Additional phone number:   Household Members/Support Persons (HM/SP):   Household Member/Support Person 1, Household Member/Support Person 2, Household Member/Support Person 3, Household Member/Support Person 4   HM/SP Name Relationship DOB or Age  HM/SP -Cathy. (lives with [place] at [address] in [place], [name] (MOB did not have telephone number).) son 01/02/2003  HM/SP -2 Cathy [name](lives with PGM (MOB does not have address but telephone number is [phone]) son  08/04/2007  HM/SP -3 (S) Cathy [name](lives with father and MOB has not had any contact since 2017. Per MOB FOB and son live ins [place] son  03/09/2009  HM/SP -4 Cathy [name] son 09/24/2011  HM/SP -5        HM/SP -6        HM/SP -7        HM/SP -8          Natural Supports (not living in the home):  Parent, Immediate Family, Extended Family   Professional Supports: Case Manager/Social Worker(MOB has a case Freight forwarder at [facility] [employer].  MOB is also an established patient with [facility].)   Employment: Unemployed   Type of Work:     Education:  9 to 11 years   Homebound arranged: No  Financial Resources:  [employer]   Other Resources:  [employer], Physicist, medical    Cultural/Religious Considerations Which May Impact Care:  Per [employer], MOB is Engineer, manufacturing.   Strengths:   Ability to meet basic needs , Psychotropic Medications, Home prepared for child , Pediatrician chosen, Compliance with medical plan , Understanding of illness   Psychotropic Medications:  (Vistaril)      Pediatrician:    [place]  Pediatrician List:   [name] [place]      Pediatrician Fax Number:    Risk Factors/Current Problems:  Substance Use , Mental Health Concerns    Cognitive State:  Able to Concentrate , Alert , Linear Thinking , Insightful    Mood/Affect:  Comfortable , Happy , Interested , Bright    CSW Assessment: CSW met with MOB in room 117 to complete an assessment for hx of homelessness, MH hx, and SA hx, and CPS hx. When CSW arrived, MOB had several room guest.  With MOB's permission, CSW asked MOB's guest to leave in order to complete MOB's assessment in private. MOB was polite, forthcoming, and receptive to meeting with CSW.   CSW asked about MOB's current living arrangement and MOB acknowledged currently living at [facility] [place] is aware that [facility] the [place] residents are provided with a wealth of community resources for housing, mental health, parenting, and any other resources requested by MOB.  MOB also has a  case manager that MOB meets with weekly at [facility].  Per MOB, [facility] is assisting MOB obtain permanent housing.   CSW asked about MOB's MH hx and MOB shared her being dx with  Bipolar and anxiety/depression. Per MOB, MOB medication is managed by [facility].  MOB reported feeling like MOB's medications are managing MOB's symptoms. CSW provided education regarding the baby blues period vs. perinatal mood disorders, discussed treatment and gave resources for mental health follow up if concerns arise.  CSW recommends self-evaluation during the postpartum time period using the New Mom Checklist from Postpartum  Progress and encouraged MOB to contact a medical professional if symptoms are noted at any time.  MOB presented with insight and awareness and did not display any acute symptoms. CSW assessed for safety and MOB denied SI, HI, and DV.  MOB reported feeling comfortable seeking help if needed. MOB also receives outpatient counseling at [facility].   CSW asked about MOB's SA Hx.  MOB openly shard her use of marijuana throughout her pregnancy.  MOB reported her last use was about 30 days ago. CSW informed MOB of hospital's substance exposed policy and MOB was understanding.  MOB was made aware that infant's UDS was positive for Barbiturates; however hospital is aware that MOB has an active Rx for substance. MOB denied the use of all other substances. CPS shared CPS hx and reported that MOB's older 3 children were placed in kinship in order to avoid foster care placement. CSW made MOB aware that due to MOB's CPS hx, CSW will make a report to [place]; MOB was understanding. MOB is also aware that if infant's CDS is positive without an explanation, CSW will report results to CPS.   CSW provided review of Sudden Infant Death Syndrome (SIDS) precautions.    CSW reports having all essential items for infant.    CSW report made to [facility].  CPS will follow-with family within 72 hours if CSW report is accepted.   There are no barriers to discharge.  Laurey Arrow, MSW, LCSW Clinical Social Work [phone]  CSW Plan/Description:  No Further Intervention Required/No Barriers to Discharge, Sudden Infant Death Syndrome (SIDS) Education, Other Information/Referral to [employer], Perinatal Mood and Anxiety Disorder (PMADs) Education, Other Patient/Family Education, [place], Child Protective Service Report , CSW Will Continue to Monitor Umbilical Cord Tissue Drug Screen Results and Make Report if Warranted   Laurey Arrow, MSW,  LCSW Clinical Social Work [phone]   Dimple Nanas, LCSW 02/04/2019, 3:50 PM

## 2019-02-04 NOTE — Progress Notes (Signed)
Contact with Dr. Ilda Basset in regards to pt's BP of 163/93. Instructed to give PO Procardia now. Will continue to monitor. Toya Smothers, RN

## 2019-02-04 NOTE — Addendum Note (Signed)
Addendum  created 02/04/19 0741 by Flossie Dibble, CRNA   Clinical Note Signed

## 2019-02-04 NOTE — Progress Notes (Signed)
Daily Antepartum Note  Admission Date: 02/01/2019 Current Date: 02/04/2019 10:21 AM  Cathy [name] is a 38 y.o. [id number] PPD#2/POD#1 ppBTL (Filshie clips) SVD/intact perineum @ 35wks. Patient admitted for severe pre-eclampsia (BPs).  Pregnancy complicated by: Patient Active Problem List   Diagnosis Date Noted  . 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 Tubal ligation evaluation 02/03/2019  . Renal hypertension 02/02/2019  . Pulmonary edema 02/01/2019  . AMA (advanced maternal age) multigravida 35+ 01/12/2019  . Non compliance w medication regimen 01/12/2019  . Current chronic use of systemic steroids 01/12/2019  . Unwanted fertility 12/10/2018  . Anemia 10/12/2018  . Supervision of high risk pregnancy, antepartum 10/04/2018  . Lupus ([place]) 09/20/2018  . Supervision of pregnancy with grand multiparity in second trimester 09/20/2018  . Substance abuse ([place]) 09/20/2018  . Acute renal failure (ARF) ([place]) 09/20/2018  . MDD (major depressive disorder) 10/11/2015  . Asthma 09/22/2011    Overnight/24hr events:  None  Subjective:  +Flatus, a little sore at the incision site. No s/s of pre-eclampsia.   Objective:     Current Vital Signs 24h Vital Sign Ranges  T 98.1 F (36.7 C) Temp  Avg: 98.1 F (36.7 C)  Min: 97.8 F (36.6 C)  Max: 99 F (37.2 C)  BP (!) 149/92 BP  Min: 139/92  Max: 177/110  HR 77 Pulse  Avg: 64.7  Min: 56  Max: 85  RR 18 Resp  Avg: 16.4  Min: 11  Max: 24  SaO2 100 % (room air) SpO2  Avg: 99.3 %  Min: 90 %  Max: 100 %       24 Hour I/O Current Shift I/O  Time Ins Outs 03/05 0701 - 03/06 0700 In: 518 [P.O.:418; I.V.:100] Out: 4505 [Urine:4500] 03/06 0701 - 03/06 1900 In: 120 [P.O.:120] Out: -    Patient Vitals for the past 24 hrs:  BP Temp Temp src Pulse Resp SpO2  02/04/19 0900 (!) 149/92 - - 77 - -  02/04/19 0820 (!) 163/93 98.1 F (36.7 C) Oral 68 18 100 %  02/04/19 0510 (!) 159/99 98.1 F (36.7 C) Oral 85 16 98 %  02/04/19 0315 (!) 151/98 98 F  (36.7 C) Oral 71 17 -  02/03/19 2254 (!) 159/93 98 F (36.7 C) Oral 66 17 97 %  02/03/19 1825 (!) 151/96 - - 65 - -  02/03/19 1815 (!) 160/102 - - 67 - -  02/03/19 1800 (!) 160/103 - - 71 - -  02/03/19 1754 (!) 167/109 - - 71 - -  02/03/19 1739 (!) 165/111 99 F (37.2 C) Oral 68 18 100 %  02/03/19 1735 - - - - - 100 %  02/03/19 1720 - - - 65 17 100 %  02/03/19 1716 - - - - 17 -  02/03/19 1715 (!) 158/109 - - 67 17 99 %  02/03/19 1714 - - - - 18 -  02/03/19 1713 - - - - 19 -  02/03/19 1712 - - - - 19 -  02/03/19 1711 - - - - 20 -  02/03/19 1710 - - - - 19 -  02/03/19 1709 - - - - 18 -  02/03/19 1708 - - - - 11 -  02/03/19 1705 - - - 66 18 100 %  02/03/19 1704 - - - 72 - 100 %  02/03/19 1703 - - - 65 17 100 %  02/03/19 1702 - - - 70 20 100 %  02/03/19 1701 - - - 65 13  100 %  02/03/19 1700 (!) 158/106 - - 65 15 100 %  02/03/19 1659 - - - 65 16 100 %  02/03/19 1658 - - - 66 13 100 %  02/03/19 1657 - - - 66 18 100 %  02/03/19 1656 - - - 66 16 100 %  02/03/19 1655 - - - 60 16 100 %  02/03/19 1654 - - - 64 16 100 %  02/03/19 1653 - - - 66 16 100 %  02/03/19 1652 - - - 67 15 100 %  02/03/19 1651 - - - 65 17 100 %  02/03/19 1650 - - - 63 18 100 %  02/03/19 1649 - - - 64 14 100 %  02/03/19 1648 - - - 62 19 100 %  02/03/19 1647 - - - 62 18 100 %  02/03/19 1646 - - - 63 15 100 %  02/03/19 1645 (!) 167/108 - - 63 13 100 %  02/03/19 1644 - - - 66 16 100 %  02/03/19 1643 - - - 65 18 100 %  02/03/19 1642 - - - - 17 -  02/03/19 1641 - - - (!) 56 19 90 %  02/03/19 1640 - - - 65 18 100 %  02/03/19 1639 - - - 69 (!) 23 98 %  02/03/19 1638 - - - 65 17 100 %  02/03/19 1637 - - - (!) 59 (!) 22 99 %  02/03/19 1636 - - - (!) 59 (!) 24 100 %  02/03/19 1635 - - - (!) 58 20 99 %  02/03/19 1634 - - - (!) 58 14 98 %  02/03/19 1633 - - - 65 16 99 %  02/03/19 1632 - - - 65 (!) 23 99 %  02/03/19 1631 - - - 65 15 99 %  02/03/19 1630 (!) 167/108 - - 66 16 100 %  02/03/19 1629 - - - 62 16 97 %   02/03/19 1628 - - - (!) 59 15 98 %  02/03/19 1627 - - - (!) 59 13 99 %  02/03/19 1626 - - - 60 14 99 %  02/03/19 1625 - - - 62 19 98 %  02/03/19 1624 - - - 64 15 98 %  02/03/19 1623 - - - 60 14 99 %  02/03/19 1622 - - - 60 14 99 %  02/03/19 1621 - - - 63 14 98 %  02/03/19 1620 - - - (!) 59 14 98 %  02/03/19 1619 - - - (!) 59 15 99 %  02/03/19 1618 - - - 62 19 100 %  02/03/19 1617 - - - 69 20 100 %  02/03/19 1616 - - - 66 17 99 %  02/03/19 1615 (!) 177/110 - - 63 15 99 %  02/03/19 1614 - - - 66 17 100 %  02/03/19 1613 - - - 68 16 99 %  02/03/19 1612 - - - 64 18 100 %  02/03/19 1611 - - - 60 14 98 %  02/03/19 1610 - - - 60 20 99 %  02/03/19 1609 - - - 61 15 98 %  02/03/19 1608 - - - 67 (!) 21 99 %  02/03/19 1607 - - - 71 15 99 %  02/03/19 1606 - - - 61 12 100 %  02/03/19 1605 (!) 173/109 - - 60 16 100 %  02/03/19 1604 - - - 64 17 99 %  02/03/19 1603 - - - 66 13 100 %    02/03/19 1602 - - - 62 15 100 %  02/03/19 1601 - - - 62 13 100 %  02/03/19 1600 (!) 166/115 - - 63 12 100 %  02/03/19 1545 (!) 171/109 - - 70 13 100 %  02/03/19 1530 (!) 152/96 - - - - -  02/03/19 1515 (!) 140/97 - - 64 15 97 %  02/03/19 1507 (!) 139/92 98 F (36.7 C) - 61 11 99 %  02/03/19 1330 (!) 145/88 98 F (36.7 C) Oral 72 18 100 %  02/03/19 1300 - - - - 16 100 %  02/03/19 1200 - - - - 12 100 %  02/03/19 1125 (!) 158/99 97.8 F (36.6 C) Oral 70 13 99 %  02/03/19 1120 - - - - 13 -  02/03/19 1115 - - - - 15 -  02/03/19 1110 - - - - 19 -  02/03/19 1105 - - - - 14 -  02/03/19 1100 - - - - 17 -  02/03/19 1055 - - - - 18 -  02/03/19 1050 - - - - 16 -  02/03/19 1045 - - - - 19 -  02/03/19 1040 - - - - 17 -  02/03/19 1035 - - - - 19 -  02/03/19 1030 - - - - 18 -  02/03/19 1025 - - - - 14 -   Physical exam: General: Well nourished, well developed female in no acute distress. Abdomen: nttp, mild to moderately distended. C/d/i incision Cardiovascular: S1, S2 normal, no murmur, rub or gallop, regular  rate and rhythm Respiratory: CTAB Skin: Warm and dry.   Medications: Current Facility-Administered Medications  Medication Dose Route Frequency Provider Last Rate Last Dose  . acetaminophen (TYLENOL) tablet 650 mg  650 mg Oral Q4H PRN Aletha Halim, MD   650 mg at 02/04/19 0524  . azaTHIOprine (IMURAN) tablet 50 mg  50 mg Oral BID Aletha Halim, MD   50 mg at 02/04/19 0914  . benzocaine-Menthol (DERMOPLAST) 20-0.5 % topical spray 1 application  1 application Topical PRN Aletha Halim, MD      . coconut oil  1 application Topical PRN Aletha Halim, MD      . witch hazel-glycerin (TUCKS) pad 1 application  1 application Topical PRN Aletha Halim, MD       And  . dibucaine (NUPERCAINAL) 1 % rectal ointment 1 application  1 application Rectal PRN Aletha Halim, MD      . diphenhydrAMINE (BENADRYL) capsule 25 mg  25 mg Oral Q6H PRN Aletha Halim, MD      . folic acid (FOLVITE) tablet 1 mg  1 mg Oral Daily Aletha Halim, MD   1 mg at 02/04/19 0914  . labetalol (NORMODYNE,TRANDATE) injection 20 mg  20 mg Intravenous PRN Aletha Halim, MD   20 mg at 02/02/19 1606   And  . labetalol (NORMODYNE,TRANDATE) injection 40 mg  40 mg Intravenous PRN Aletha Halim, MD   40 mg at 02/01/19 2049   And  . labetalol (NORMODYNE,TRANDATE) injection 80 mg  80 mg Intravenous PRN Aletha Halim, MD   80 mg at 02/03/19 1759   And  . hydrALAZINE (APRESOLINE) injection 10 mg  10 mg Intravenous PRN Aletha Halim, MD   10 mg at 02/01/19 2335  . HYDROmorphone (DILAUDID) injection 0.5 mg  0.5 mg Intravenous Q5 min PRN Montez Hageman, MD   0.5 mg at 02/04/19 1003  . NIFEdipine (PROCARDIA-XL/NIFEDICAL-XL) 24 hr tablet 30 mg  30 mg Oral BID Aletha Halim, MD  30 mg at 02/04/19 0831  . ondansetron (ZOFRAN) tablet 4 mg  4 mg Oral Q4H PRN Aletha Halim, MD       Or  . ondansetron (ZOFRAN) injection 4 mg  4 mg Intravenous Q4H PRN Aletha Halim, MD      . predniSONE (DELTASONE) tablet  40 mg  40 mg Oral Q breakfast Aletha Halim, MD   40 mg at 02/04/19 0757  . prenatal multivitamin tablet 1 tablet  1 tablet Oral Q1200 Aletha Halim, MD      . simethicone (MYLICON) chewable tablet 80 mg  80 mg Oral PRN Aletha Halim, MD      . sodium bicarbonate tablet 650 mg  650 mg Oral BID Aletha Halim, MD   650 mg at 02/03/19 2116  . Tdap (BOOSTRIX) injection 0.5 mL  0.5 mL Intramuscular Once Aletha Halim, MD        Labs:  Recent Labs  Lab 02/03/19 [id number] 02/03/19 1053 02/04/19 0902  WBC 11.3* 10.6* 10.1  HGB 8.8* 9.1* 9.7*  HCT 26.0* 27.4* 29.4*  PLT 186 184 225    Recent Labs  Lab 02/03/19 0642 02/03/19 1053 02/04/19 0902  NA 133* 133* 133*  K 5.4* 5.1 4.4  CL 107 107 106  CO2 16* 18* 20*  BUN 11 11 15   CREATININE 1.18* 1.08* 1.17*  CALCIUM 7.3* 7.2* 7.8*  PROT 5.8* 5.9* 5.9*  BILITOT 0.1* 0.2* 0.2*  ALKPHOS 102 100 95  ALT 12 12 12   AST 15 16 14*  GLUCOSE 111* 99 93     Radiology: no new imaging  Assessment & Plan:  Pt stable *PP: routine care. D/w pt re: recommendation for HSG PP *Renal: labs stable *Lupus: continue with current regimen *HTN: continue with procardia bid *Anemia: improved. Has 2 more units on hold.  *PPx: SCDs *FEN/GI: SLIV, renal diet *Dispo: likely tomorrow  Durene Romans. MD Attending [facility])

## 2019-02-04 NOTE — Anesthesia Postprocedure Evaluation (Signed)
Anesthesia Post Note  Patient: Cathy [name]  Procedure(s) Performed: POST PARTUM TUBAL LIGATION (N/A )     Patient location during evaluation: OB High Risk Anesthesia Type: Epidural Level of consciousness: awake and alert Pain management: pain level controlled Vital Signs Assessment: post-procedure vital signs reviewed and stable Respiratory status: spontaneous breathing, nonlabored ventilation and respiratory function stable Cardiovascular status: stable Postop Assessment: no headache, no backache, epidural receding, no apparent nausea or vomiting, patient able to bend at knees, able to ambulate and adequate PO intake Anesthetic complications: no    Last Vitals:  Vitals:   02/04/19 0315 02/04/19 0510  BP: (!) 151/98 (!) 159/99  Pulse:  85  Resp: 17 16  Temp: 36.7 C 36.7 C  SpO2:  98%    Last Pain:  Vitals:   02/04/19 0715  TempSrc:   PainSc: 3    Pain Goal: Patients Stated Pain Goal: 3 (02/04/19 0715)                 Katherina Mires

## 2019-02-04 NOTE — Progress Notes (Signed)
Dr. Ilda Basset made aware of patient's BP of  164/101 and the retake of 165/90. Order received for an oral dose of Procardia to be given. Toya Smothers, RN

## 2019-02-05 ENCOUNTER — Encounter (HOSPITAL_COMMUNITY): Payer: Self-pay | Admitting: Obstetrics and Gynecology

## 2019-02-05 LAB — TYPE AND SCREEN
ABO/RH(D): O POS
Antibody Screen: NEGATIVE
Donor AG Type: NEGATIVE
Donor AG Type: NEGATIVE
Donor AG Type: NEGATIVE
Donor AG Type: NEGATIVE
Unit division: 0
Unit division: 0
Unit division: 0
Unit division: 0
Unit division: 0

## 2019-02-05 LAB — BPAM RBC
Blood Product Expiration Date: 202003262359
Blood Product Expiration Date: 202004022359
Blood Product Expiration Date: 202004032359
Blood Product Expiration Date: 202004032359
Blood Product Expiration Date: 202004042359
ISSUE DATE / TIME: 202003042113
ISSUE DATE / TIME: 202003050102
Unit Type and Rh: 5100
Unit Type and Rh: 5100
Unit Type and Rh: 5100
Unit Type and Rh: 5100
Unit Type and Rh: 5100

## 2019-02-05 LAB — CBC
HCT: 28.2 % — ABNORMAL LOW (ref 36.0–46.0)
Hemoglobin: 9 g/dL — ABNORMAL LOW (ref 12.0–15.0)
MCH: 29.7 pg (ref 26.0–34.0)
MCHC: 31.9 g/dL (ref 30.0–36.0)
MCV: 93.1 fL (ref 80.0–100.0)
Platelets: 205 10*3/uL (ref 150–400)
RBC: 3.03 MIL/uL — ABNORMAL LOW (ref 3.87–5.11)
RDW: 15.3 % (ref 11.5–15.5)
WBC: 9.1 10*3/uL (ref 4.0–10.5)
nRBC: 0 % (ref 0.0–0.2)

## 2019-02-05 LAB — COMPREHENSIVE METABOLIC PANEL
ALT: 10 U/L (ref 0–44)
AST: 13 U/L — ABNORMAL LOW (ref 15–41)
Albumin: 2.1 g/dL — ABNORMAL LOW (ref 3.5–5.0)
Alkaline Phosphatase: 83 U/L (ref 38–126)
Anion gap: 3 — ABNORMAL LOW (ref 5–15)
BUN: 18 mg/dL (ref 6–20)
CALCIUM: 7.8 mg/dL — AB (ref 8.9–10.3)
CO2: 21 mmol/L — ABNORMAL LOW (ref 22–32)
Chloride: 109 mmol/L (ref 98–111)
Creatinine, Ser: 1.12 mg/dL — ABNORMAL HIGH (ref 0.44–1.00)
GFR calc non Af Amer: >60 mL/min (ref >60)
Glucose, Bld: 93 mg/dL (ref 70–99)
Potassium: 4.3 mmol/L (ref 3.5–5.1)
Sodium: 133 mmol/L — ABNORMAL LOW (ref 135–145)
Total Bilirubin: 0.3 mg/dL (ref 0.3–1.2)
Total Protein: 5.5 g/dL — ABNORMAL LOW (ref 6.5–8.1)

## 2019-02-05 MED ORDER — NIFEDIPINE ER 60 MG PO TB24: 60.0000 mg | ORAL_TABLET | Freq: Two times a day (BID) | ORAL | 1 refills | Status: DC

## 2019-02-05 MED ORDER — NIFEDIPINE ER OSMOTIC RELEASE 30 MG PO TB24
60.0000 mg | ORAL_TABLET | Freq: Two times a day (BID) | ORAL | Status: DC
  Administered 2019-02-05 – 2019-02-06 (×2): 60 mg via ORAL
  Filled 2019-02-05 (×2): qty 2

## 2019-02-05 MED ORDER — NIFEDIPINE ER OSMOTIC RELEASE 30 MG PO TB24
30.0000 mg | ORAL_TABLET | Freq: Once | ORAL | Status: AC
  Administered 2019-02-05: 30 mg via ORAL
  Filled 2019-02-05: qty 1

## 2019-02-05 MED ORDER — OXYCODONE HCL 5 MG PO TABS
10.0000 mg | ORAL_TABLET | ORAL | Status: DC | PRN
  Administered 2019-02-05 – 2019-02-06 (×4): 10 mg via ORAL
  Filled 2019-02-05 (×4): qty 2

## 2019-02-05 MED ORDER — LABETALOL HCL 300 MG PO TABS: 300.0000 mg | ORAL_TABLET | Freq: Two times a day (BID) | ORAL | 1 refills | Status: DC

## 2019-02-05 MED ORDER — LABETALOL HCL 200 MG PO TABS
300.0000 mg | ORAL_TABLET | Freq: Two times a day (BID) | ORAL | Status: DC
  Administered 2019-02-05 – 2019-02-06 (×3): 300 mg via ORAL
  Filled 2019-02-05 (×3): qty 1

## 2019-02-05 NOTE — Progress Notes (Signed)
   02/05/19 1002  Vital Signs  BP (!) 172/102  Dr. Kennon Rounds called regarding above BP.Marland KitchenMarland KitchenShe is in an OR case and will return my call

## 2019-02-05 NOTE — Progress Notes (Signed)
Patient ID: Cathy [name], female   DOB: [birth year], 38 y.o.   MRN: [id number]   Subjective: Interval History:PPD #3 s/p SVD. POD #2 s/p BTL. BP's remain high. Reports new bump on tongue.  Objective: Vital signs in last 24 hours: Temp:  [98 F (36.7 C)-98.3 F (36.8 C)] 98.1 F (36.7 C) (03/07 0909) Pulse Rate:  [63-79] 63 (03/07 1050) Resp:  [18-20] 18 (03/07 0909) BP: (151-176)/(90-106) 158/91 (03/07 1050) SpO2:  [97 %-100 %] 99 % (03/07 0910)  Intake/Output from previous day: 03/06 0701 - 03/07 0700 In: 1120 [P.O.:1120] Out: 2850 [Urine:2850] Intake/Output this shift: No intake/output data recorded.  General appearance: alert, cooperative and appears stated age Head: Normocephalic, without obvious abnormality, atraumatic Lungs: normal effort Heart: regular rate and rhythm Abdomen: fundus firm, non-tender Extremities: Homans sign is negative, no sign of DVT Skin: Skin color, texture, turgor normal. No rashes or lesions  Results for orders placed or performed during the hospital encounter of 02/01/19 (from the past 24 hour(s))  CBC     Status: Abnormal   Collection Time: 02/05/19  6:45 AM  Result Value Ref Range   WBC 9.1 4.0 - 10.5 K/uL   RBC 3.03 (L) 3.87 - 5.11 MIL/uL   Hemoglobin 9.0 (L) 12.0 - 15.0 g/dL   HCT 28.2 (L) 36.0 - 46.0 %   MCV 93.1 80.0 - 100.0 fL   MCH 29.7 26.0 - 34.0 pg   MCHC 31.9 30.0 - 36.0 g/dL   RDW 15.3 11.5 - 15.5 %   Platelets 205 150 - 400 K/uL   nRBC 0.0 0.0 - 0.2 %  Comprehensive metabolic panel     Status: Abnormal   Collection Time: 02/05/19  6:45 AM  Result Value Ref Range   Sodium 133 (L) 135 - 145 mmol/L   Potassium 4.3 3.5 - 5.1 mmol/L   Chloride 109 98 - 111 mmol/L   CO2 21 (L) 22 - 32 mmol/L   Glucose, Bld 93 70 - 99 mg/dL   BUN 18 6 - 20 mg/dL   Creatinine, Ser 1.12 (H) 0.44 - 1.00 mg/dL   Calcium 7.8 (L) 8.9 - 10.3 mg/dL   Total Protein 5.5 (L) 6.5 - 8.1 g/dL   Albumin 2.1 (L) 3.5 - 5.0 g/dL   AST 13 (L) 15 - 41 U/L    ALT 10 0 - 44 U/L   Alkaline Phosphatase 83 38 - 126 U/L   Total Bilirubin 0.3 0.3 - 1.2 mg/dL   GFR calc non Af Amer >60 >60 mL/min   GFR calc Af Amer >60 >60 mL/min   Anion gap 3 (L) 5 - 15    Studies/Results:   Scheduled Meds: . azaTHIOprine  50 mg Oral BID  . folic acid  1 mg Oral Daily  . labetalol  300 mg Oral BID  . NIFEdipine  60 mg Oral BID  . predniSONE  40 mg Oral Q breakfast  . prenatal multivitamin  1 tablet Oral Q1200  . sodium bicarbonate  650 mg Oral BID  . Tdap  0.5 mL Intramuscular Once   Continuous Infusions: PRN Meds:acetaminophen, benzocaine-Menthol, coconut oil, witch hazel-glycerin **AND** dibucaine, diphenhydrAMINE, [DISCONTINUED] labetalol **AND** [DISCONTINUED] labetalol **AND** [DISCONTINUED] labetalol **AND** hydrALAZINE **AND** Measure blood pressure, HYDROmorphone (DILAUDID) injection, NIFEdipine **AND** NIFEdipine **AND** NIFEdipine **AND** labetalol **AND** Measure blood pressure, ondansetron **OR** ondansetron (ZOFRAN) IV, oxyCODONE, simethicone  Assessment/Plan: Active Problems:   Asthma   MDD (major depressive disorder)   Lupus (HCC)   Anemia   Unwanted  fertility   AMA (advanced maternal age) multigravida 35+   Non compliance w medication regimen   Current chronic use of systemic steroids   Pulmonary edema   Renal hypertension   Pelvic adhesive disease   Tubal ligation evaluation  Poor BP control. Discussed case with [facility] MD. To increase her Procardia to 60 xl bid and add Labetalol 300 mg bid. Hold ACE-I due to Cr.  They will arrange f/u in their office. Probable D/C in am. Needs Rheumatology referral.   LOS: 4 days   Donnamae Jude, MD 02/05/2019 11:27 AM

## 2019-02-05 NOTE — Progress Notes (Signed)
Pt has been reporting sharp/stabbing pain for the past few hours in her RLQ. Bowel sounds audible, abdomen firm and tender on palpation. Pt was given pain medication and mylicon for gas with minimal relief. Pt encouraged to ambulate to get rid of the gas. Pt plans to ambulate in the AM.  Nicholes Calamity, RN

## 2019-02-05 NOTE — Progress Notes (Signed)
   02/05/19 0909  Vital Signs  BP (!) 176/103  Patient upset after having heated conversation with sig. Other.

## 2019-02-05 NOTE — Discharge Instructions (Signed)
Postpartum Care After Vaginal Delivery °This sheet gives you information about how to care for yourself from the time you deliver your baby to up to 6-12 weeks after delivery (postpartum period). Your health care provider may also give you more specific instructions. If you have problems or questions, contact your health care provider. °Follow these instructions at home: °Vaginal bleeding °· It is normal to have vaginal bleeding (lochia) after delivery. Wear a sanitary pad for vaginal bleeding and discharge. °? During the first week after delivery, the amount and appearance of lochia is often similar to a menstrual period. °? Over the next few weeks, it will gradually decrease to a dry, yellow-brown discharge. °? For most women, lochia stops completely by 4-6 weeks after delivery. Vaginal bleeding can vary from woman to woman. °· Change your sanitary pads frequently. Watch for any changes in your flow, such as: °? A sudden increase in volume. °? A change in color. °? Large blood clots. °· If you pass a blood clot from your vagina, save it and call your health care provider to discuss. Do not flush blood clots down the toilet before talking with your health care provider. °· Do not use tampons or douches until your health care provider says this is safe. °· If you are not breastfeeding, your period should return 6-8 weeks after delivery. If you are feeding your child breast milk only (exclusive breastfeeding), your period may not return until you stop breastfeeding. °Perineal care °· Keep the area between the vagina and the anus (perineum) clean and dry as told by your health care provider. Use medicated pads and pain-relieving sprays and creams as directed. °· If you had a cut in the perineum (episiotomy) or a tear in the vagina, check the area for signs of infection until you are healed. Check for: °? More redness, swelling, or pain. °? Fluid or blood coming from the cut or tear. °? Warmth. °? Pus or a bad  smell. °· You may be given a squirt bottle to use instead of wiping to clean the perineum area after you go to the bathroom. As you start healing, you may use the squirt bottle before wiping yourself. Make sure to wipe gently. °· To relieve pain caused by an episiotomy, a tear in the vagina, or swollen veins in the anus (hemorrhoids), try taking a warm sitz bath 2-3 times a day. A sitz bath is a warm water bath that is taken while you are sitting down. The water should only come up to your hips and should cover your buttocks. °Breast care °· Within the first few days after delivery, your breasts may feel heavy, full, and uncomfortable (breast engorgement). Milk may also leak from your breasts. Your health care provider can suggest ways to help relieve the discomfort. Breast engorgement should go away within a few days. °· If you are breastfeeding: °? Wear a bra that supports your breasts and fits you well. °? Keep your nipples clean and dry. Apply creams and ointments as told by your health care provider. °? You may need to use breast pads to absorb milk that leaks from your breasts. °? You may have uterine contractions every time you breastfeed for up to several weeks after delivery. Uterine contractions help your uterus return to its normal size. °? If you have any problems with breastfeeding, work with your health care provider or lactation consultant. °· If you are not breastfeeding: °? Avoid touching your breasts a lot. Doing this can make   your breasts produce more milk. °? Wear a good-fitting bra and use cold packs to help with swelling. °? Do not squeeze out (express) milk. This causes you to make more milk. °Intimacy and sexuality °· Ask your health care provider when you can engage in sexual activity. This may depend on: °? Your risk of infection. °? How fast you are healing. °? Your comfort and desire to engage in sexual activity. °· You are able to get pregnant after delivery, even if you have not had  your period. If desired, talk with your health care provider about methods of birth control (contraception). °Medicines °· Take over-the-counter and prescription medicines only as told by your health care provider. °· If you were prescribed an antibiotic medicine, take it as told by your health care provider. Do not stop taking the antibiotic even if you start to feel better. °Activity °· Gradually return to your normal activities as told by your health care provider. Ask your health care provider what activities are safe for you. °· Rest as much as possible. Try to rest or take a nap while your baby is sleeping. °Eating and drinking ° °· Drink enough fluid to keep your urine pale yellow. °· Eat high-fiber foods every day. These may help prevent or relieve constipation. High-fiber foods include: °? Whole grain cereals and breads. °? Brown rice. °? Beans. °? Fresh fruits and vegetables. °· Do not try to lose weight quickly by cutting back on calories. °· Take your prenatal vitamins until your postpartum checkup or until your health care provider tells you it is okay to stop. °Lifestyle °· Do not use any products that contain nicotine or tobacco, such as cigarettes and e-cigarettes. If you need help quitting, ask your health care provider. °· Do not drink alcohol, especially if you are breastfeeding. °General instructions °· Keep all follow-up visits for you and your baby as told by your health care provider. Most women visit their health care provider for a postpartum checkup within the first 3-6 weeks after delivery. °Contact a health care provider if: °· You feel unable to cope with the changes that your child brings to your life, and these feelings do not go away. °· You feel unusually sad or worried. °· Your breasts become red, painful, or hard. °· You have a fever. °· You have trouble holding urine or keeping urine from leaking. °· You have little or no interest in activities you used to enjoy. °· You have not  breastfed at all and you have not had a menstrual period for 12 weeks after delivery. °· You have stopped breastfeeding and you have not had a menstrual period for 12 weeks after you stopped breastfeeding. °· You have questions about caring for yourself or your baby. °· You pass a blood clot from your vagina. °Get help right away if: °· You have chest pain. °· You have difficulty breathing. °· You have sudden, severe leg pain. °· You have severe pain or cramping in your lower abdomen. °· You bleed from your vagina so much that you fill more than one sanitary pad in one hour. Bleeding should not be heavier than your heaviest period. °· You develop a severe headache. °· You faint. °· You have blurred vision or spots in your vision. °· You have bad-smelling vaginal discharge. °· You have thoughts about hurting yourself or your baby. °If you ever feel like you may hurt yourself or others, or have thoughts about taking your own life, get help   right away. You can go to the nearest emergency department or call:  Your local emergency services (911 in the [place]  A suicide crisis helpline, such as the [place] at 217-654-6934. This is open 24 hours a day. Summary  The period of time right after you deliver your newborn up to 6-12 weeks after delivery is called the postpartum period.  Gradually return to your normal activities as told by your health care provider.  Keep all follow-up visits for you and your baby as told by your health care provider. This information is not intended to replace advice given to you by your health care provider. Make sure you discuss any questions you have with your health care provider. Document Released: 09/14/2007 Document Revised: 08/31/2017 Document Reviewed: 08/31/2017 Elsevier Interactive Patient Education  2019 [place]. Postpartum Hypertension Postpartum hypertension is high blood pressure that remains higher than normal after  childbirth. You may not realize that you have postpartum hypertension if your blood pressure is not being checked regularly. In most cases, postpartum hypertension will go away on its own, usually within a week of delivery. However, for some women, medical treatment is required to prevent serious complications, such as seizures or stroke. What are the causes? This condition may be caused by one or more of the following:  Hypertension that existed before pregnancy (chronic hypertension).  Hypertension that comes on as a result of pregnancy (gestational hypertension).  Hypertensive disorders during pregnancy (preeclampsia) or seizures in women who have high blood pressure during pregnancy (eclampsia).  A condition in which the liver, platelets, and red blood cells are damaged during pregnancy (HELLP syndrome).  A condition in which the thyroid produces too much hormones (hyperthyroidism).  Other rare problems of the nerves (neurological disorders) or blood disorders. In some cases, the cause may not be known. What increases the risk? The following factors may make you more likely to develop this condition:  Chronic hypertension. In some cases, this may not have been diagnosed before pregnancy.  Obesity.  Type 2 diabetes.  Kidney disease.  History of preeclampsia or eclampsia.  Other medical conditions that change the level of hormones in the body (hormonal imbalance). What are the signs or symptoms? As with all types of hypertension, postpartum hypertension may not have any symptoms. Depending on how high your blood pressure is, you may experience:  Headaches. These may be mild, moderate, or severe. They may also be steady, constant, or sudden in onset (thunderclap headache).  Changes in your ability to see (visual changes).  Dizziness.  Shortness of breath.  Swelling of your hands, feet, lower legs, or face. In some cases, you may have swelling in more than one of these  locations.  Heart palpitations or a racing heartbeat.  Difficulty breathing while lying down.  Decrease in the amount of urine that you pass. Other rare signs and symptoms may include:  Sweating more than usual. This lasts longer than a few days after delivery.  Chest pain.  Sudden dizziness when you get up from sitting or lying down.  Seizures.  Nausea or vomiting.  Abdominal pain. How is this diagnosed? This condition may be diagnosed based on the results of a physical exam, blood pressure measurements, and blood and urine tests. You may also have other tests, such as a CT scan or an MRI, to check for other problems of postpartum hypertension. How is this treated? If blood pressure is high enough to require treatment, your options may include:  Medicines  to reduce blood pressure (antihypertensives). Tell your health care provider if you are breastfeeding or if you plan to breastfeed. There are many antihypertensive medicines that are safe to take while breastfeeding.  Stopping medicines that may be causing hypertension.  Treating medical conditions that are causing hypertension.  Treating the complications of hypertension, such as seizures, stroke, or kidney problems. Your health care provider will also continue to monitor your blood pressure closely until it is within a safe range for you. Follow these instructions at home:  Take over-the-counter and prescription medicines only as told by your health care provider.  Return to your normal activities as told by your health care provider. Ask your health care provider what activities are safe for you.  Do not use any products that contain nicotine or tobacco, such as cigarettes and e-cigarettes. If you need help quitting, ask your health care provider.  Keep all follow-up visits as told by your health care provider. This is important. Contact a health care provider if:  Your symptoms get worse.  You have new symptoms,  such as: ? A headache that does not get better. ? Dizziness. ? Visual changes. Get help right away if:  You suddenly develop swelling in your hands, ankles, or face.  You have sudden, rapid weight gain.  You develop difficulty breathing, chest pain, racing heartbeat, or heart palpitations.  You develop severe pain in your abdomen.  You have any symptoms of a stroke. "BE FAST" is an easy way to remember the main warning signs of a stroke: ? B - Balance. Signs are dizziness, sudden trouble walking, or loss of balance. ? E - Eyes. Signs are trouble seeing or a sudden change in vision. ? F - Face. Signs are sudden weakness or numbness of the face, or the face or eyelid drooping on one side. ? A - Arms. Signs are weakness or numbness in an arm. This happens suddenly and usually on one side of the body. ? S - Speech. Signs are sudden trouble speaking, slurred speech, or trouble understanding what people say. ? T - Time. Time to call emergency services. Write down what time symptoms started.  You have other signs of a stroke, such as: ? A sudden, severe headache with no known cause. ? Nausea or vomiting. ? Seizure. These symptoms may represent a serious problem that is an emergency. Do not wait to see if the symptoms will go away. Get medical help right away. Call your local emergency services (911 in the [place] Do not drive yourself to the hospital. Summary  Postpartum hypertension is high blood pressure that remains higher than normal after childbirth.  In most cases, postpartum hypertension will go away on its own, usually within a week of delivery.  For some women, medical treatment is required to prevent serious complications, such as seizures or stroke. This information is not intended to replace advice given to you by your health care provider. Make sure you discuss any questions you have with your health care provider. Document Released: 07/21/2014 Document Revised: 09/07/2017  Document Reviewed: 09/07/2017 Elsevier Interactive Patient Education  2019 [employer].

## 2019-02-06 MED ORDER — LABETALOL HCL 200 MG PO TABS: 400.0000 mg | ORAL_TABLET | Freq: Two times a day (BID) | ORAL | Status: DC

## 2019-02-06 MED ORDER — LABETALOL HCL 100 MG PO TABS
100.0000 mg | ORAL_TABLET | ORAL | Status: AC
  Administered 2019-02-06: 100 mg via ORAL
  Filled 2019-02-06: qty 1

## 2019-02-06 MED ORDER — LABETALOL HCL 200 MG PO TABS: 400.0000 mg | ORAL_TABLET | Freq: Two times a day (BID) | ORAL | 1 refills | Status: DC

## 2019-02-07 ENCOUNTER — Encounter: Payer: Self-pay | Admitting: Obstetrics and Gynecology

## 2019-02-07 DIAGNOSIS — N7011 Chronic salpingitis: Secondary | ICD-10-CM | POA: Insufficient documentation

## 2019-02-09 ENCOUNTER — Ambulatory Visit: Payer: Self-pay

## 2019-02-11 ENCOUNTER — Ambulatory Visit (HOSPITAL_COMMUNITY): Payer: Self-pay

## 2019-02-11 ENCOUNTER — Ambulatory Visit (HOSPITAL_COMMUNITY): Payer: Medicaid Other

## 2019-03-03 ENCOUNTER — Telehealth: Payer: Self-pay | Admitting: Family Medicine

## 2019-03-03 ENCOUNTER — Ambulatory Visit (INDEPENDENT_AMBULATORY_CARE_PROVIDER_SITE_OTHER): Payer: Medicaid Other | Admitting: Family Medicine

## 2019-03-03 ENCOUNTER — Other Ambulatory Visit: Payer: Self-pay

## 2019-03-03 ENCOUNTER — Ambulatory Visit (INDEPENDENT_AMBULATORY_CARE_PROVIDER_SITE_OTHER): Payer: Medicaid Other | Admitting: Clinical

## 2019-03-03 DIAGNOSIS — I129 Hypertensive chronic kidney disease with stage 1 through stage 4 chronic kidney disease, or unspecified chronic kidney disease: Secondary | ICD-10-CM

## 2019-03-03 DIAGNOSIS — F53 Postpartum depression: Secondary | ICD-10-CM

## 2019-03-03 DIAGNOSIS — O99345 Other mental disorders complicating the puerperium: Secondary | ICD-10-CM

## 2019-03-03 DIAGNOSIS — F3160 Bipolar disorder, current episode mixed, unspecified: Secondary | ICD-10-CM

## 2019-03-03 DIAGNOSIS — M3214 Glomerular disease in systemic lupus erythematosus: Secondary | ICD-10-CM

## 2019-03-03 MED ORDER — LURASIDONE HCL 20 MG PO TABS: 20.0000 mg | ORAL_TABLET | Freq: Every day | ORAL | 0 refills | Status: DC

## 2019-03-03 NOTE — Telephone Encounter (Signed)
Called the patient to infom of the telephone visits due to the [place] restrictions. No questions at this time.

## 2019-03-03 NOTE — Telephone Encounter (Signed)
Attempted to call patient to let her know about her follow-up appointment for her pp depression on 4/30 @ 4:15. No answer and could not leave a mailbox because it was full.

## 2019-03-03 NOTE — Progress Notes (Signed)
TELEHEALTH VIRTUAL POSTPARTUM VISIT ENCOUNTER NOTE  I connected with the patient on 03/03/19 at  3:35 PM EDT by telephone at home and verified that I am speaking with the correct person using two identifiers.   I discussed the limitations, risks, security and privacy concerns of performing an evaluation and management service by telephone and the availability of in person appointments. I also discussed with the patient that there may be a patient responsible charge related to this service. The patient expressed understanding and agreed to proceed.  Appointment Date: 03/03/2019  OBGYN Clinic: [facility]  Chief Complaint: No chief complaint on file.   History of Present Illness: Cathy [name] is a 38 y.o. African-American [id number] (Patient's last menstrual period was 06/22/2018.), seen for the above chief complaint. Her past medical history is significant for lupus nephritis   She is s/p normal spontaneous vaginal delivery on 02/02/2019 at 35 weeks; she was discharged to home on 02/04/2019. Pregnancy complicated by lupus nephritis, CHTN with superimposed preeclampsia. Baby is doing well.  Complains of postpartum depression  Vaginal bleeding or discharge: Yes  Mode of feeding infant: Bottle Intercourse: No  Contraception: bilateral tubal ligation PP depression s/s: Yes .  Any bowel or bladder issues: No  Pap smear: uncertain  Review of Systems: Her 12 point review of systems is negative or as noted in the History of Present Illness.  Patient Active Problem List   Diagnosis Date Noted  . 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 Tubal ligation evaluation 02/03/2019  . Renal hypertension 02/02/2019  . Pulmonary edema 02/01/2019  . AMA (advanced maternal age) multigravida 35+ 01/12/2019  . Non compliance w medication regimen 01/12/2019  . Current chronic use of systemic steroids 01/12/2019  . Unwanted fertility 12/10/2018  . Anemia 10/12/2018  .  Supervision of high risk pregnancy, antepartum 10/04/2018  . Lupus ([place]) 09/20/2018  . Supervision of pregnancy with grand multiparity in second trimester 09/20/2018  . Substance abuse ([place]) 09/20/2018  . Acute renal failure (ARF) ([place]) 09/20/2018  . MDD (major depressive disorder) 10/11/2015  . Asthma 09/22/2011    Medications Siriyah A. Rickman had no medications administered during this visit. Current Outpatient Medications  Medication Sig Dispense Refill  . aspirin EC 81 MG tablet Take 1 tablet (81 mg total) by mouth daily. Take after 12 weeks for prevention of preeclampsia later in pregnancy (Patient not taking: Reported on 01/28/2019) 300 tablet 2  . azaTHIOprine (IMURAN) 50 MG tablet Take 1 tablet (50 mg total) by mouth 2 (two) times daily. (Patient not taking: Reported on 01/28/2019) 60 tablet 0  . butalbital-acetaminophen-caffeine (FIORICET, ESGIC) 50-325-40 MG tablet Take 1-2 tablets by mouth every 6 (six) hours as needed for headache. (Patient not taking: Reported on 01/28/2019) 20 tablet 0  . cyanocobalamin (,VITAMIN B-12,) 1000 MCG/ML injection Inject weekly. (Patient not taking: Reported on 01/28/2019) 10 mL 0  . famotidine (PEPCID) 20 MG tablet Take 1 tablet (20 mg total) by mouth 2 (two) times daily. 60 tablet 6  . folic acid (FOLVITE) 1 MG tablet Take 1 tablet (1 mg total) by mouth daily. (Patient not taking: Reported on 02/01/2019) 30 tablet 0  . labetalol (NORMODYNE) 200 MG tablet Take 2 tablets (400 mg total) by mouth 2 (two) times daily. 60 tablet 1  . NIFEdipine (ADALAT CC) 60 MG 24 hr tablet Take 1 tablet (60 mg total) by mouth 2 (two) times daily. 60 tablet 1  . predniSONE (DELTASONE) 20 MG tablet Take  2 tablets (40 mg total) by mouth daily with breakfast. 60 tablet 0  . Prenatal Vit-Fe Fumarate-FA (PRENATAL MULTIVITAMIN) TABS tablet Take 1 tablet by mouth daily at 12 noon. 30 tablet 0  . promethazine (PHENERGAN) 12.5 MG tablet Take 1 tablet (12.5 mg total) by mouth every 6  (six) hours as needed for nausea or vomiting. (Patient not taking: Reported on 02/01/2019) 30 tablet 0  . sodium bicarbonate 650 MG tablet Take 1 tablet (650 mg total) by mouth 2 (two) times daily. 60 tablet 0   No current facility-administered medications for this visit.     Allergies Patient has no known allergies.  Physical Exam:  General:  Alert, oriented and cooperative.   Mental Status: Normal mood and affect perceived. Normal judgment and thought content.  Rest of physical exam deferred due to type of encounter  PP Depression Screening:    Assessment:Patient is a 38 y.o. [name] who is 4 weeks postpartum from a normal spontaneous vaginal delivery.  She is doing well.   Plan: 1. Postpartum examination following vaginal delivery BTL for contraception Return to work at 6 weeks  2. Lupus nephritis ([place]) Continue with Rheumatology  3. Postpartum depression Restart latuda - Patient had previous care with [facility] - instructed patient to contact them for continued care  4. Renal hypertension Continue antihypertensives.  There are no diagnoses linked to this encounter.  RTC 4 weeks  I discussed the assessment and treatment plan with the patient. The patient was provided an opportunity to ask questions and all were answered. The patient agreed with the plan and demonstrated an understanding of the instructions.   The patient was advised to call back or seek an in-person evaluation/go to the ED for any concerning postpartum symptoms.  I provided 14 minutes of non-face-to-face time during this encounter.   [place] for [employer], [place]

## 2019-03-03 NOTE — BH Specialist Note (Signed)
Integrated Behavioral Health Visit via Telemedicine (Telephone)  03/03/2019 Cathy [name] [id number]   Session Start time: 6  Session End time: 4:36 Total time: 30 minutes  Referring Provider: Loma Boston, DO Type of Visit: Telephonic Patient location: Home  [facility] Provider location: [place] All persons participating in visit: [facility] and Patient  Confirmed patient's address: Yes  Confirmed patient's phone number: Yes  Any changes to demographics: Yes   Confirmed patient's insurance: Yes  Any changes to patient's insurance: Yes   Discussed confidentiality: Yes    The following statements were read to the patient and/or legal guardian that are established with the [facility] Provider.  "The purpose of this phone visit is to provide behavioral health care while limiting exposure to the coronavirus (COVID19).  There is a possibility of technology failure and discussed alternative modes of communication if that failure occurs."  "By engaging in this telephone visit, you consent to the provision of healthcare.  Additionally, you authorize for your insurance to be billed for the services provided during this telephone visit."   Patient and/or legal guardian consented to telephone visit: Yes   PRESENTING CONCERNS: Patient and/or family reports the following symptoms/concerns: Pt states her primary concern today is feeling irritable and depressed, along with being unmedicated for depression, anxiety, and bipolar disorder since August; was being treated at [facility] with [place] and Hydroxyzine(panic attacks) prior to pregnancy. No SI, No HI.  Duration of problem: Ongoing; Severity of problem: moderate  STRENGTHS (Protective Factors/Coping Skills): Pt has engaged with ongoing therapy and psychiatry in the past, and responded well to [facility] meds.   GOALS ADDRESSED: Patient will: 1.  Reduce symptoms of: anxiety, depression and mood instability  2.  Demonstrate ability to: Increase adequate  support systems for patient/family and Improve medication compliance  INTERVENTIONS: Interventions utilized:  Mindfulness or Psychologist, educational and Psychoeducation and/or Health Education Standardized Assessments completed: [place] Postnatal Depression  ASSESSMENT: Patient currently experiencing Bipolar affective disorder.   Patient may benefit from psychoeducation and brief therapeutic interventions regarding coping with symptoms of mood instability, and referral to psychiatry. Marland Kitchen  PLAN: 1. Follow up with behavioral health clinician on : 03/14/2019 via brief phone follow-up 2. Behavioral recommendations:  -Begin taking Latuda, as prescribed -Contact [facility] to re-establish with previous therapist, and for ongoing [place] med management(refills by calling [phone]) -Continue taking naps throughout the day -Consider using CALM or other breathing exercises daily, that have helped in the past, to gain control over mood fluctuations -Consider online new mom support at www.[email or web address].net  3. Referral(s): [place] (In Clinic), [place] (LME/Outside Clinic) and Community Resources:  Mom support  Garlan Fair

## 2019-03-08 NOTE — BH Specialist Note (Signed)
Integrated Behavioral Health Visit via Telemedicine (Telephone)  03/08/2019 [name] [id number]   Session Start time: 1:02 Session End time: 1:12 Total time: 15 minutes  Referring Provider: Loma Boston, DO Type of Visit: Telephonic Patient [facility] Provider location: [place] All persons participating in visit: [facility] and Patient (Pt's children in background of call)  Confirmed patient's address: Yes  Confirmed patient's phone number: Yes  Any changes to demographics: No   Confirmed patient's insurance: Yes  Any changes to patient's insurance: No   Discussed confidentiality: Discussed at last visit   The following statements were read to the patient and/or legal guardian that are established with the [facility] Provider.  "The purpose of this phone visit is to provide behavioral health care while limiting exposure to the coronavirus (COVID19).  There is a possibility of technology failure and discussed alternative modes of communication if that failure occurs."  "By engaging in this telephone visit, you consent to the provision of healthcare.  Additionally, you authorize for your insurance to be billed for the services provided during this telephone visit."   Patient and/or legal guardian consented to telephone visit: Yes   PRESENTING CONCERNS: Patient and/or family reports the following symptoms/concerns: Pt states she has been taking Latuda for one week now with no negative side effects. Pt is sleeping only 30 minutes to an hour stretches at night, with naps during the daytime; requests medication to help with sleep (previously took Hydroxyzine with Latuda). Pt is still attempting to get ahold of [facility] to re-establish care, uncertain she has the correct phone number; feels the daily relaxation breathing exercises are helpful to cope.  Duration of problem: Ongoing, increase postpartum; Severity of problem: moderate  STRENGTHS (Protective Factors/Coping  Skills): Pt has responded well to [facility] meds in past and present, and is open to re-starting ongoing therapy and psychiatry.   GOALS ADDRESSED: Patient will: 1.  Reduce symptoms of: anxiety, depression and mood instability  2.  Demonstrate ability to: Increase healthy adjustment to current life circumstances  INTERVENTIONS: Interventions utilized:  Medication Monitoring and Link to [employer] Standardized Assessments completed: not given today  ASSESSMENT: Patient currently experiencing Bipolar affective disorder   Patient may benefit from continued brief therapeutic intervention regarding coping with symptoms of depression, anxiety and mood instability.  PLAN: 1. Follow up with behavioral health clinician on : One week (brief follow-up) 2. Behavioral recommendations:  -Continue taking Latuda as prescribed -Continue practicing daily CALM relaxation breathing exercise -Contact [facility] to re-establish care at [phone] (appointment) and [phone] (prescription refills once re-established) -Consider online new mom support group at [email or web address].net   3. Referral(s): Integrated Orthoptist (In Clinic), Community Resources:  new mom support and Psychiatrist  Caroleen [facility]  Depression screen [facility] 2/9 01/12/2019 12/10/2018 10/18/2018 10/04/2018 07/28/2018  Decreased Interest 1 1 0 2 0  Down, Depressed, Hopeless 1 - 0 0 1  PHQ - 2 Score 2 1 0 2 1  Altered sleeping 1 1 0 3 -  Tired, decreased energy 1 1 2 3  -  Change in appetite 1 0 0 0 -  Feeling bad or failure about yourself  0 0 0 0 -  Trouble concentrating 0 1 0 0 -  Moving slowly or fidgety/restless 0 0 0 0 -  Suicidal thoughts 0 0 0 0 -  PHQ-9 Score 5 4 2 8  -    GAD 7 : Generalized Anxiety Score 01/12/2019 12/10/2018 10/18/2018 10/04/2018  Nervous, Anxious, on Edge 1 1  1 0  Control/stop worrying 1 1 1 1   Worry too much - different things 1 1 1 1   Trouble relaxing 1 1 1 2   Restless 0 0 0 1  Easily  annoyed or irritable 1 2 3 2   Afraid - awful might happen 0 0 0 0  Total GAD 7 Score 5 6 7  7

## 2019-03-14 ENCOUNTER — Ambulatory Visit (INDEPENDENT_AMBULATORY_CARE_PROVIDER_SITE_OTHER): Payer: Medicaid Other | Admitting: Clinical

## 2019-03-14 DIAGNOSIS — F3160 Bipolar disorder, current episode mixed, unspecified: Secondary | ICD-10-CM | POA: Diagnosis present

## 2019-03-31 ENCOUNTER — Ambulatory Visit: Payer: Medicaid Other | Admitting: Family Medicine

## 2019-03-31 ENCOUNTER — Other Ambulatory Visit: Payer: Self-pay

## 2019-03-31 ENCOUNTER — Encounter: Payer: Self-pay | Admitting: Family Medicine

## 2019-03-31 ENCOUNTER — Telehealth: Payer: Self-pay | Admitting: Family Medicine

## 2019-03-31 DIAGNOSIS — F3160 Bipolar disorder, current episode mixed, unspecified: Secondary | ICD-10-CM

## 2019-03-31 NOTE — Progress Notes (Signed)
pPatient never connected with app despite repeat attempts to call her again.

## 2019-03-31 NOTE — Telephone Encounter (Signed)
Attempted to call patient with her rescheduled virtual appointment (5/7 @ 1:55pm). No answer. Left appointment information as well as office number if needing to reschedule.

## 2019-04-05 ENCOUNTER — Telehealth: Payer: Self-pay | Admitting: Family Medicine

## 2019-04-05 NOTE — Telephone Encounter (Signed)
Called and left a detailed message about appt change for Thursday 04-07-2019.

## 2019-04-07 ENCOUNTER — Encounter: Payer: Medicaid Other | Admitting: Obstetrics and Gynecology

## 2019-04-07 ENCOUNTER — Ambulatory Visit: Payer: Medicaid Other | Admitting: Obstetrics and Gynecology

## 2019-04-07 NOTE — Progress Notes (Signed)
0906::attempted to contact pt regarding her visit.  Pt did not pick up.  Left voicemail advising pt that she was being contacted regarding her appointment and requesting that she contact the clinic. Advised pt that she would be contacted again in 15 minutes.

## 2019-04-07 NOTE — Progress Notes (Signed)
Attempted to call pt x2 at 9:17 and 9:35 with no answer. Left pt a voicemail encouraging her to call back and reschedule her appointment.

## 2019-04-29 ENCOUNTER — Other Ambulatory Visit: Payer: Self-pay | Admitting: Family Medicine

## 2019-05-24 ENCOUNTER — Other Ambulatory Visit: Payer: Self-pay

## 2020-03-26 IMAGING — US US RENAL
1 series · 14 of 25 positions shown · non-contrast
Comparison: Ultrasound 09/20/2018.  CT 11/08/2010.

CLINICAL DATA: Acute renal failure.

EXAM:
RENAL / URINARY TRACT ULTRASOUND COMPLETE

[Series 1: us renal · 0.20mm/px · 14 of 48 slices shown]
[im 1/48]
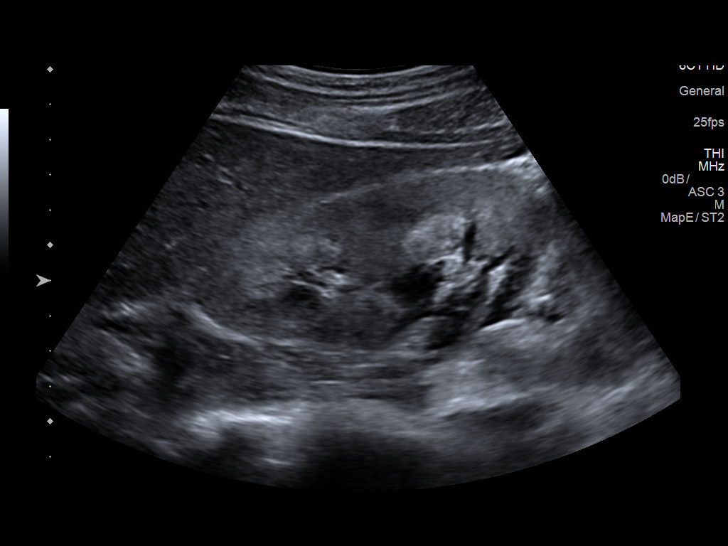
[im 4/48]
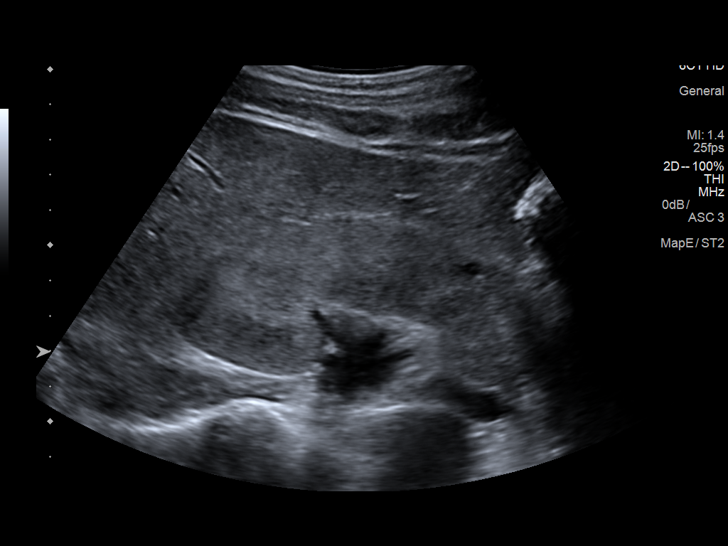
[im 8/48]
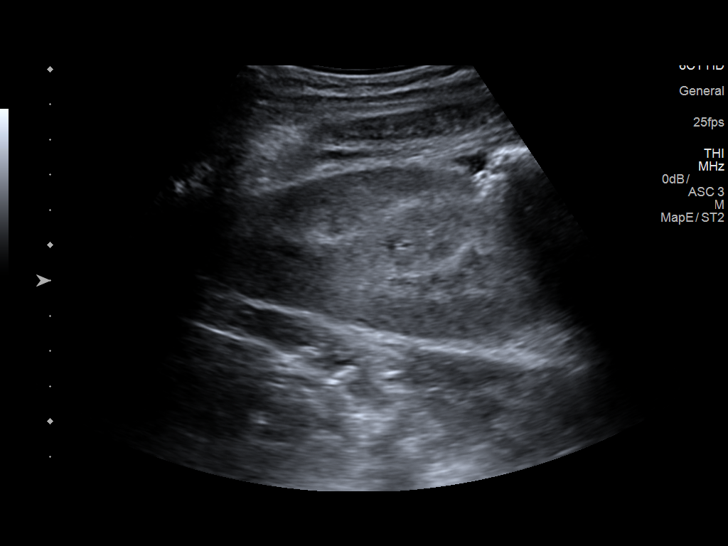
[im 12/48]
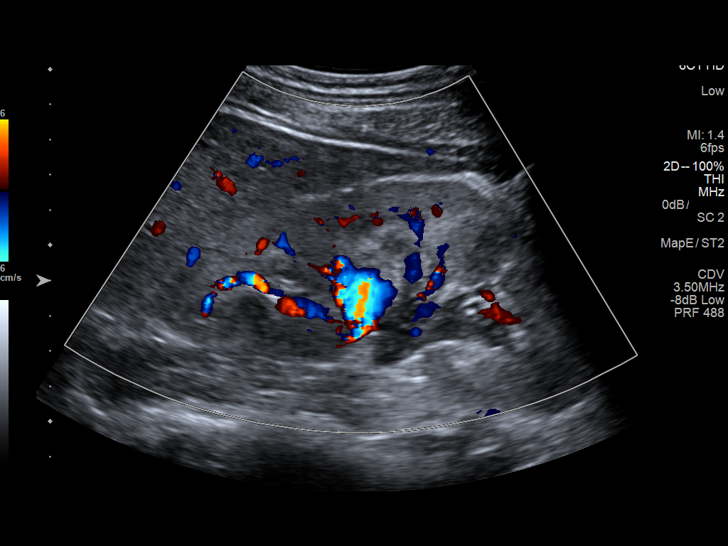
[im 16/48]
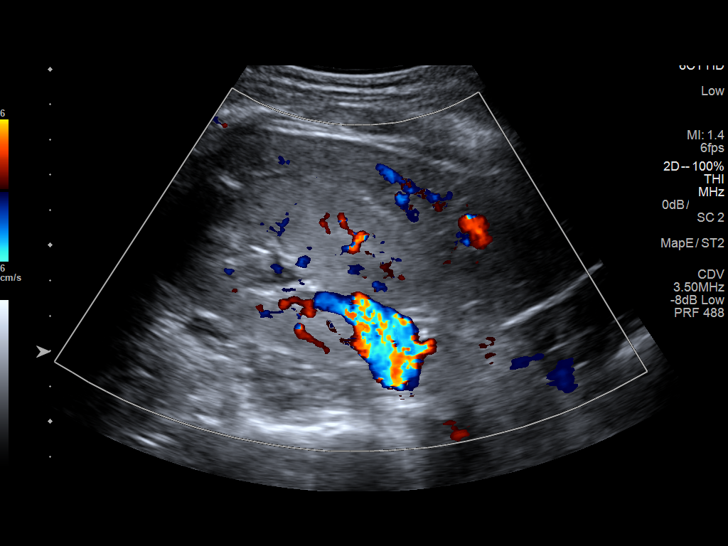
[im 18/48]
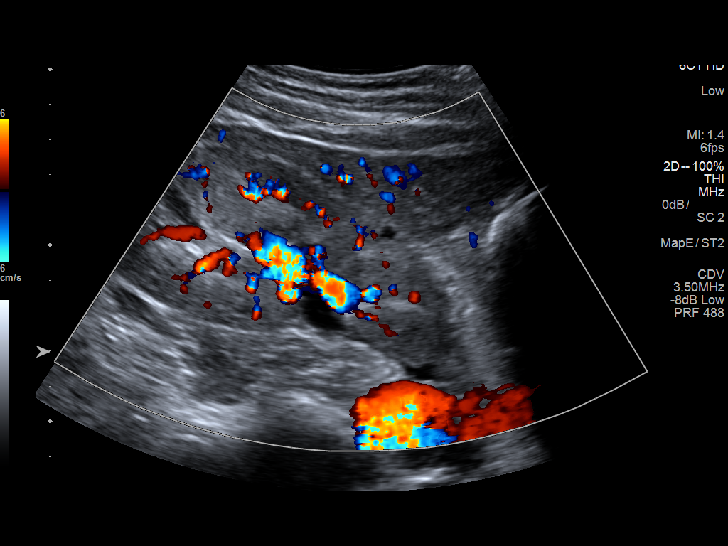
[im 22/48]
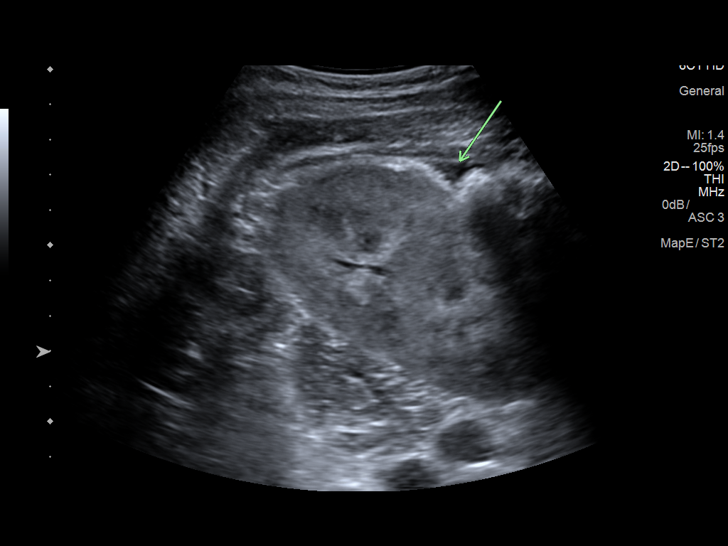
[im 26/48]
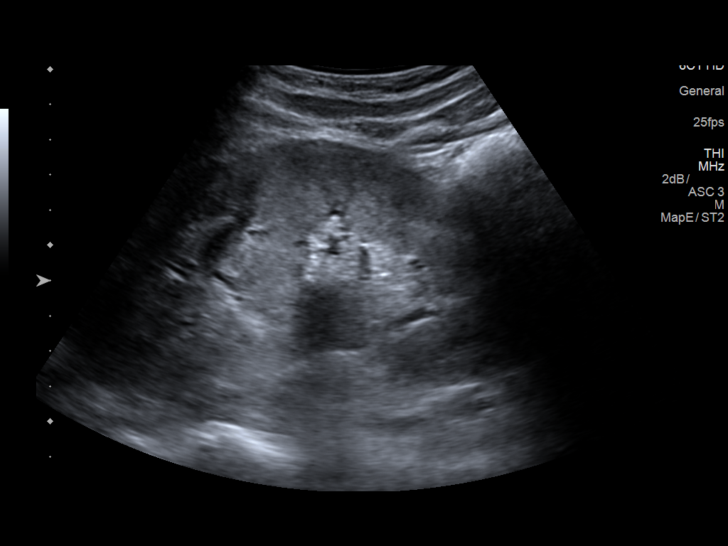
[im 30/48]
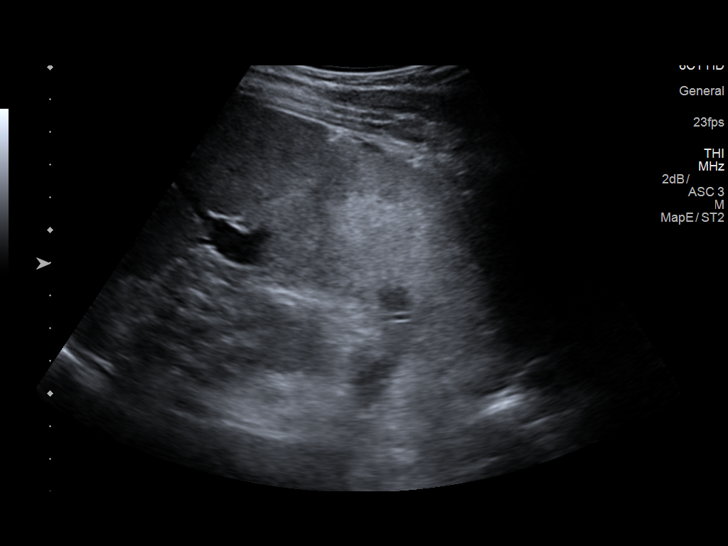
[im 32/48]
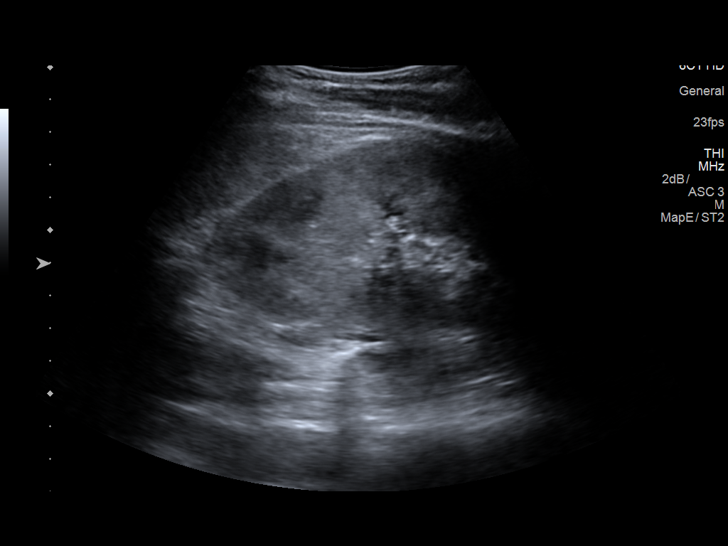
[im 36/48]
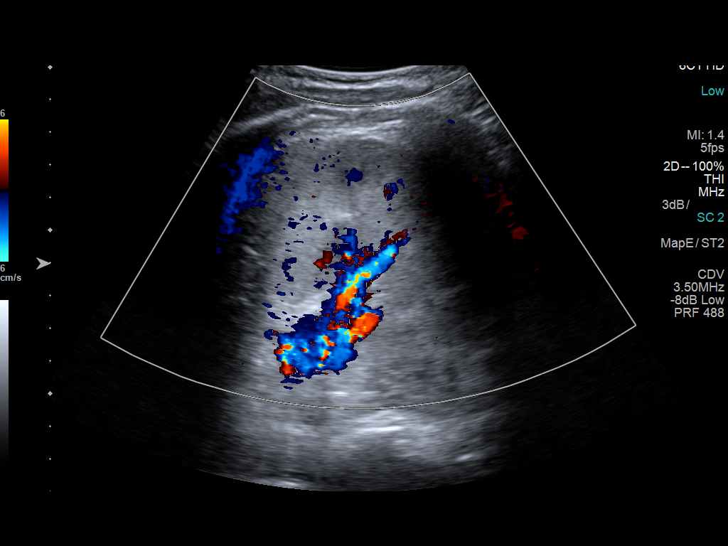
[im 40/48]
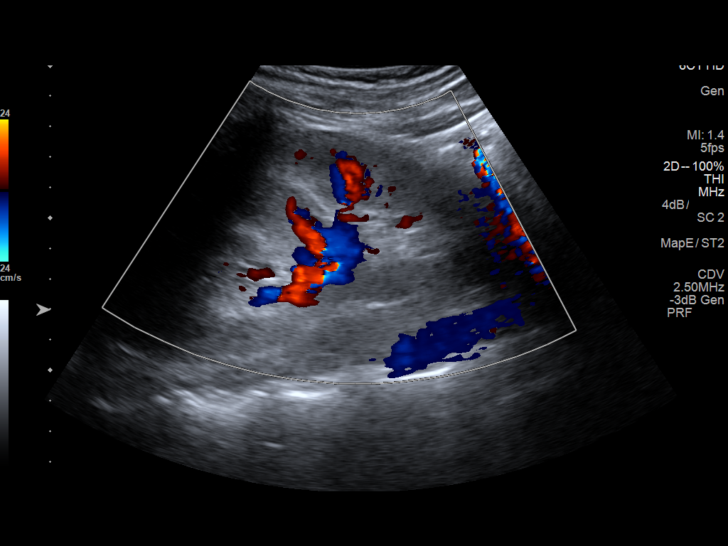
[im 44/48]
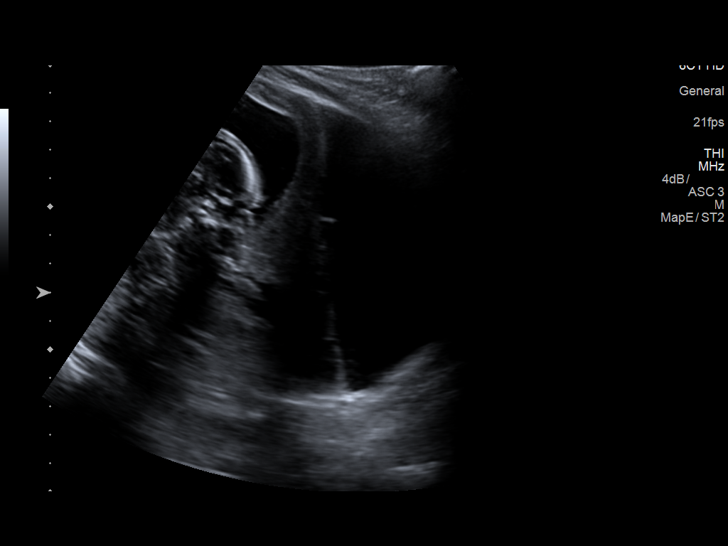
[im 48/48]
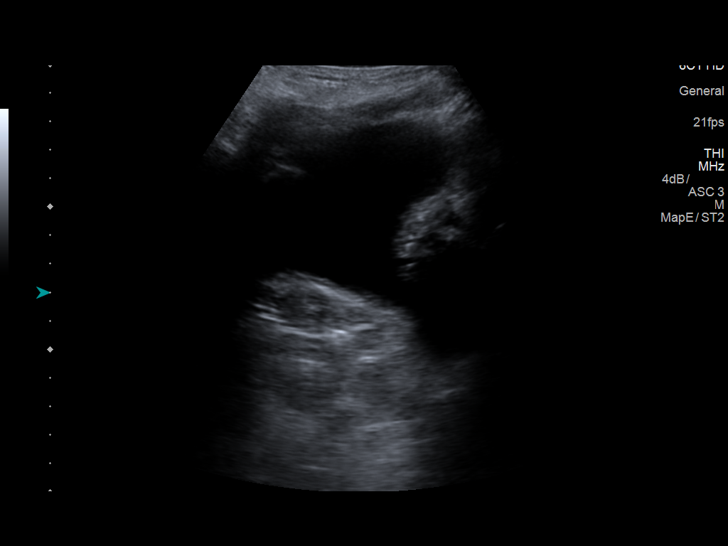

[14 of 25 positions shown; findings below may reference images not displayed]

FINDINGS: Right Kidney:

Length: 13.0 cm. Increased echogenicity. No mass. Mild right
hydronephrosis. Trace amount of fluid noted about the right kidney.

Left Kidney:

Length: 13.3 cm. Increased echogenicity. No mass or hydronephrosis
visualized.

Bladder:

Appears normal for degree of bladder distention. Patient is
pregnant. Reference is made to prior ultrasound of 09/20/2018.
IMPRESSION: 1. Mild right hydronephrosis. Trace amount of fluid noted about the
right kidney.

2. Increased echogenicity both kidneys consistent chronic medical
renal disease.

## 2020-08-07 ENCOUNTER — Encounter (HOSPITAL_COMMUNITY): Payer: Self-pay | Admitting: Emergency Medicine

## 2020-08-07 ENCOUNTER — Other Ambulatory Visit: Payer: Self-pay

## 2020-08-07 ENCOUNTER — Emergency Department (HOSPITAL_COMMUNITY)
Admission: EM | Admit: 2020-08-07 | Discharge: 2020-08-07 | Disposition: A | Discharge status: Home / Self Care | Payer: Medicaid Other | Attending: Emergency Medicine | Admitting: Emergency Medicine

## 2020-08-07 DIAGNOSIS — R109 Unspecified abdominal pain: Secondary | ICD-10-CM | POA: Insufficient documentation

## 2020-08-07 DIAGNOSIS — Z5321 Procedure and treatment not carried out due to patient leaving prior to being seen by health care provider: Secondary | ICD-10-CM | POA: Insufficient documentation

## 2020-08-07 DIAGNOSIS — M320 Drug-induced systemic lupus erythematosus: Secondary | ICD-10-CM | POA: Insufficient documentation

## 2020-08-07 DIAGNOSIS — M25473 Effusion, unspecified ankle: Secondary | ICD-10-CM | POA: Insufficient documentation

## 2020-08-07 LAB — CBC
HCT: 33.5 % — ABNORMAL LOW (ref 36.0–46.0)
Hemoglobin: 10.5 g/dL — ABNORMAL LOW (ref 12.0–15.0)
MCH: 27.5 pg (ref 26.0–34.0)
MCHC: 31.3 g/dL (ref 30.0–36.0)
MCV: 87.7 fL (ref 80.0–100.0)
Platelets: 260 10*3/uL (ref 150–400)
RBC: 3.82 MIL/uL — ABNORMAL LOW (ref 3.87–5.11)
RDW: 13.3 % (ref 11.5–15.5)
WBC: 5.8 10*3/uL (ref 4.0–10.5)
nRBC: 0 % (ref 0.0–0.2)

## 2020-08-07 LAB — COMPREHENSIVE METABOLIC PANEL
ALT: 43 U/L (ref 0–44)
AST: 31 U/L (ref 15–41)
Albumin: 2.8 g/dL — ABNORMAL LOW (ref 3.5–5.0)
Alkaline Phosphatase: 174 U/L — ABNORMAL HIGH (ref 38–126)
Anion gap: 8 (ref 5–15)
BUN: 9 mg/dL (ref 6–20)
CO2: 21 mmol/L — ABNORMAL LOW (ref 22–32)
Calcium: 8.4 mg/dL — ABNORMAL LOW (ref 8.9–10.3)
Chloride: 109 mmol/L (ref 98–111)
Creatinine, Ser: 0.8 mg/dL (ref 0.44–1.00)
GFR calc Af Amer: >60 mL/min (ref >60)
GFR calc non Af Amer: >60 mL/min (ref >60)
Glucose, Bld: 87 mg/dL (ref 70–99)
Potassium: 3.6 mmol/L (ref 3.5–5.1)
Sodium: 138 mmol/L (ref 135–145)
Total Bilirubin: 0.5 mg/dL (ref 0.3–1.2)
Total Protein: 7.3 g/dL (ref 6.5–8.1)

## 2020-08-07 LAB — I-STAT BETA HCG BLOOD, ED (MC, WL, AP ONLY): I-stat hCG, quantitative: <5 m[IU]/mL (ref <5)

## 2020-08-07 LAB — LIPASE, BLOOD: Lipase: 24 U/L (ref 11–51)

## 2020-08-07 NOTE — ED Triage Notes (Signed)
Pt reports being out of lupus meds for 2 months. Endorses eye swelling, ankle swelling, and left sided abd pain.

## 2020-08-07 NOTE — ED Notes (Signed)
Pt called for vitals x3, no answer.  Security stated that she was seen walking out

## 2020-10-17 ENCOUNTER — Ambulatory Visit (HOSPITAL_COMMUNITY)
Admission: EM | Admit: 2020-10-17 | Discharge: 2020-10-17 | Disposition: A | Discharge status: Home / Self Care | Payer: Medicaid Other | Attending: Family Medicine | Admitting: Family Medicine

## 2020-10-17 ENCOUNTER — Encounter (HOSPITAL_COMMUNITY): Payer: Self-pay

## 2020-10-17 ENCOUNTER — Other Ambulatory Visit: Payer: Self-pay

## 2020-10-17 DIAGNOSIS — M329 Systemic lupus erythematosus, unspecified: Secondary | ICD-10-CM | POA: Diagnosis not present

## 2020-10-17 DIAGNOSIS — N3000 Acute cystitis without hematuria: Secondary | ICD-10-CM | POA: Diagnosis not present

## 2020-10-17 DIAGNOSIS — I10 Essential (primary) hypertension: Secondary | ICD-10-CM | POA: Insufficient documentation

## 2020-10-17 LAB — CBC WITH DIFFERENTIAL/PLATELET
Abs Immature Granulocytes: 0.03 10*3/uL (ref 0.00–0.07)
Basophils Absolute: 0 10*3/uL (ref 0.0–0.1)
Basophils Relative: 0 %
Eosinophils Absolute: 0.2 10*3/uL (ref 0.0–0.5)
Eosinophils Relative: 2 %
HCT: 29.2 % — ABNORMAL LOW (ref 36.0–46.0)
Hemoglobin: 9.4 g/dL — ABNORMAL LOW (ref 12.0–15.0)
Immature Granulocytes: 0 %
Lymphocytes Relative: 18 %
Lymphs Abs: 1.2 10*3/uL (ref 0.7–4.0)
MCH: 27.6 pg (ref 26.0–34.0)
MCHC: 32.2 g/dL (ref 30.0–36.0)
MCV: 85.6 fL (ref 80.0–100.0)
Monocytes Absolute: 0.4 10*3/uL (ref 0.1–1.0)
Monocytes Relative: 6 %
Neutro Abs: 4.9 10*3/uL (ref 1.7–7.7)
Neutrophils Relative %: 74 %
Platelets: 226 10*3/uL (ref 150–400)
RBC: 3.41 MIL/uL — ABNORMAL LOW (ref 3.87–5.11)
RDW: 14.7 % (ref 11.5–15.5)
WBC: 6.8 10*3/uL (ref 4.0–10.5)
nRBC: 0 % (ref 0.0–0.2)

## 2020-10-17 LAB — COMPREHENSIVE METABOLIC PANEL
ALT: 18 U/L (ref 0–44)
AST: 24 U/L (ref 15–41)
Albumin: 2.1 g/dL — ABNORMAL LOW (ref 3.5–5.0)
Alkaline Phosphatase: 112 U/L (ref 38–126)
Anion gap: 7 (ref 5–15)
BUN: 22 mg/dL — ABNORMAL HIGH (ref 6–20)
CO2: 21 mmol/L — ABNORMAL LOW (ref 22–32)
Calcium: 7.8 mg/dL — ABNORMAL LOW (ref 8.9–10.3)
Chloride: 110 mmol/L (ref 98–111)
Creatinine, Ser: 1.77 mg/dL — ABNORMAL HIGH (ref 0.44–1.00)
GFR, Estimated: 38 mL/min — ABNORMAL LOW (ref >60)
Glucose, Bld: 96 mg/dL (ref 70–99)
Potassium: 4.2 mmol/L (ref 3.5–5.1)
Sodium: 138 mmol/L (ref 135–145)
Total Bilirubin: 0.4 mg/dL (ref 0.3–1.2)
Total Protein: 5.9 g/dL — ABNORMAL LOW (ref 6.5–8.1)

## 2020-10-17 LAB — MAGNESIUM: Magnesium: 2 mg/dL (ref 1.7–2.4)

## 2020-10-17 LAB — POCT URINALYSIS DIPSTICK, ED / UC
Glucose, UA: NEGATIVE mg/dL
Ketones, ur: 15 mg/dL — AB
Leukocytes,Ua: NEGATIVE
Nitrite: POSITIVE — AB
Protein, ur: >=300 mg/dL — AB
Specific Gravity, Urine: >=1.03 (ref 1.005–1.030)
Urobilinogen, UA: 0.2 mg/dL (ref 0.0–1.0)
pH: 6 (ref 5.0–8.0)

## 2020-10-17 LAB — RPR: RPR Ser Ql: NONREACTIVE

## 2020-10-17 LAB — POC URINE PREG, ED: Preg Test, Ur: NEGATIVE

## 2020-10-17 MED ORDER — NITROFURANTOIN MONOHYD MACRO 100 MG PO CAPS: 100.0000 mg | ORAL_CAPSULE | Freq: Two times a day (BID) | ORAL | 0 refills | Status: DC

## 2020-10-17 MED ORDER — AMLODIPINE BESYLATE 10 MG PO TABS: 10.0000 mg | ORAL_TABLET | Freq: Every day | ORAL | 3 refills | Status: DC

## 2020-10-17 NOTE — Discharge Instructions (Signed)
I have drawn a lot of blood work, checking some things. I will call you when I get the results.  You do have a UTI. Treating with antibiotics.  I am starting you on amlodipine for high blood pressure.  I have referred you to primary care and rheumatology.  Your EKG was normal Pregnancy test negative

## 2020-10-17 NOTE — ED Triage Notes (Signed)
PT reports for one month Pt reports CP . Pt reports facial swelling, ABD swelling and leg swelling . Pt has a a Hx of lupus.

## 2020-10-17 NOTE — ED Provider Notes (Signed)
[facility]    CSN: [id number] Arrival date & time: 10/17/20  6606      History   Chief Complaint Chief Complaint  Patient presents with  . Chest Pain  . Lupus  . Facial Swelling  . Possible Pregnancy    HPI Cathy [name] is a 39 y.o. female.   Patient is a 39 year old female past medical history of anxiety, asthma, bipolar, depression, gonorrhea, lupus, PID, renal hypertension.  She is here today with multiple complaints.  She has had approximately 1 month of generalized swelling, mostly to face and abdominal area.  She is also had some chest pain and shortness of breath at times.  She is also been very hypertensive.  Blood pressure reading here today is 168/124.  She was previously on labetalol for high blood pressure but is not currently taking it.  The only medicine she is currently taking is mycophenolate for her lupus. She is taking this as needed.  She is not sure if this medication is working.  She is concerned for possible pregnancy.  Reporting after she had her last child was supposed to have a tubal ligation but unable to this procedure due to problems with anesthesia.  She has had some mild swelling and pressure to lower abdominal area.  Low-grade fever today.  No nausea, vomiting, flank pain.     Past Medical History:  Diagnosis Date  . Anxiety   . Asthma   . Bipolar depression ([place])   . Depression   . Gonorrhea   . Lupus ([place])   . Pelvic inflammatory disease (PID)   . Renal hypertension   . Trichomonas infection     Patient Active Problem List   Diagnosis Date Noted  . 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 Tubal ligation evaluation 02/03/2019  . Renal hypertension 02/02/2019  . Pulmonary edema 02/01/2019  . AMA (advanced maternal age) multigravida 35+ 01/12/2019  . Non compliance w medication regimen 01/12/2019  . Current chronic use of systemic steroids 01/12/2019  . Unwanted fertility 12/10/2018   . Anemia 10/12/2018  . Supervision of high risk pregnancy, antepartum 10/04/2018  . Lupus ([place]) 09/20/2018  . Supervision of pregnancy with grand multiparity in second trimester 09/20/2018  . Substance abuse ([place]) 09/20/2018  . Acute renal failure (ARF) ([place]) 09/20/2018  . MDD (major depressive disorder) 10/11/2015  . Asthma 09/22/2011    Past Surgical History:  Procedure Laterality Date  . RENAL BIOPSY    . TUBAL LIGATION N/A 02/03/2019   Procedure: POST PARTUM TUBAL LIGATION;  Surgeon: Aletha Halim, MD;  Location: [facility] LD ORS;  Service: Gynecology;  Laterality: N/A;    OB History    Gravida  6   Para  5   Term  4   Preterm  1   AB  1   Living  5     SAB  1   TAB  0   Ectopic  0   Multiple  0   Live Births  5            Home Medications    Prior to Admission medications   Medication Sig Start Date End Date Taking? Authorizing Provider  mycophenolate (CELLCEPT) 500 MG tablet Take 500 mg by mouth in the morning and at bedtime. 01/06/20 01/05/21 Yes [provider]  amLODipine (NORVASC) 10 MG tablet Take 1 tablet (10 mg total) by mouth daily. 10/17/20   Orvan July, NP  azaTHIOprine (IMURAN) 50  MG tablet Take 1 tablet (50 mg total) by mouth 2 (two) times daily. Patient not taking: Reported on 01/28/2019 09/29/18   Jonetta Osgood, MD  famotidine (PEPCID) 20 MG tablet Take 1 tablet (20 mg total) by mouth 2 (two) times daily. 10/18/18   Truett Mainland, DO  labetalol (NORMODYNE) 200 MG tablet Take 2 tablets (400 mg total) by mouth 2 (two) times daily. Patient not taking: Reported on 03/31/2019 02/06/19   Woodroe Mode, MD  lurasidone (LATUDA) 20 MG TABS tablet Take 1 tablet (20 mg total) by mouth at bedtime. 03/03/19   Truett Mainland, DO  nitrofurantoin, macrocrystal-monohydrate, (MACROBID) 100 MG capsule Take 1 capsule (100 mg total) by mouth 2 (two) times daily. 10/17/20   Loura Halt A, NP  predniSONE (DELTASONE) 20 MG tablet Take 2 tablets (40 mg  total) by mouth daily with breakfast. 09/29/18   Ghimire, Henreitta Leber, MD    Family History Family History  Problem Relation Age of Onset  . Diabetes Maternal Grandmother   . Hypertension Maternal Grandmother   . Diabetes Maternal Grandfather   . Hypertension Maternal Grandfather   . Diabetes Paternal Grandmother   . Hypertension Paternal Grandmother   . Diabetes Paternal Grandfather   . Hypertension Paternal Grandfather     Social History Social History   Tobacco Use  . Smoking status: Current Some Day Smoker    Packs/day: 0.25    Types: Cigarettes  . Smokeless tobacco: Never Used  Vaping Use  . Vaping Use: Never used  Substance Use Topics  . Alcohol use: Not Currently  . Drug use: Not Currently    Types: Marijuana    Comment: 3 times a week      Allergies   Patient has no known allergies.   Review of Systems Review of Systems   Physical Exam Triage Vital Signs ED Triage Vitals  Enc Vitals Group     BP 10/17/20 0944 (!) 168/124     Pulse Rate 10/17/20 0944 78     Resp 10/17/20 0944 18     Temp 10/17/20 0944 99.4 F (37.4 C)     Temp Source 10/17/20 0944 Oral     SpO2 10/17/20 0944 100 %     Weight 10/17/20 0935 140 lb (63.5 kg)     Height 10/17/20 0935 5\' 7"  (1.702 m)     Head Circumference --      Peak Flow --      Pain Score 10/17/20 0934 9     Pain Loc --      Pain Edu? --      Excl. in [place]? --    No data found.  Updated Vital Signs BP (!) 168/124   Pulse 78   Temp 99.4 F (37.4 C) (Oral)   Resp 18   Ht 5\' 7"  (1.702 m)   Wt 140 lb (63.5 kg)   SpO2 100%   BMI 21.93 kg/m   Visual Acuity Right Eye Distance:   Left Eye Distance:   Bilateral Distance:    Right Eye Near:   Left Eye Near:    Bilateral Near:     Physical Exam Vitals and nursing note reviewed.  Constitutional:      General: She is not in acute distress.    Appearance: Normal appearance. She is not ill-appearing, toxic-appearing or diaphoretic.  HENT:     Head:  Normocephalic.     Nose: Nose normal.     Mouth/Throat:  Pharynx: Oropharynx is clear.  Eyes:     Conjunctiva/sclera: Conjunctivae normal.  Pulmonary:     Effort: Pulmonary effort is normal.  Abdominal:     Palpations: Abdomen is soft.     Tenderness: There is no abdominal tenderness.  Musculoskeletal:        General: Normal range of motion.     Cervical back: Normal range of motion.  Skin:    General: Skin is warm and dry.     Findings: No rash.  Neurological:     Mental Status: She is alert.  Psychiatric:        Mood and Affect: Mood normal.      UC Treatments / Results  Labs (all labs ordered are listed, but only abnormal results are displayed) Labs Reviewed  POCT URINALYSIS DIPSTICK, ED / UC - Abnormal; Notable for the following components:      Result Value   Bilirubin Urine SMALL (*)    Ketones, ur 15 (*)    Hgb urine dipstick LARGE (*)    Protein, ur >=300 (*)    Nitrite POSITIVE (*)    All other components within normal limits  URINE CULTURE  CBC WITH DIFFERENTIAL/PLATELET  COMPREHENSIVE METABOLIC PANEL  RPR  C3 COMPLEMENT  C4 COMPLEMENT  HIV ANTIBODY (ROUTINE TESTING W REFLEX)  MAGNESIUM  POC URINE PREG, ED    EKG   Radiology No results found.  Procedures Procedures (including critical care time)  Medications Ordered in UC Medications - No data to display  Initial Impression / Assessment and Plan / UC Course  I have reviewed the triage vital signs and the nursing notes.  Pertinent labs & imaging results that were available during my care of the patient were reviewed by me and considered in my medical decision making (see chart for details).     Lupus Patient may be having an acute flare of lupus.  She is currently taking mycophenolate but only taken as needed.  She is not on any other medication for lupus at this time.  She is having some generalized chest pain, swelling EKG with normal sinus rhythm with sinus arrhythmia today.  No  concerns for ACS, pericarditis at this time. Drawing some blood work to include CBC, CMP, C3 and C4 component. Also checking magnesium, HIV and RPR. Referred her to primary care and rheumatology  Essential hypertension Checking kidney function. We will go ahead and start amlodipine today for high blood pressure control.  Acute cystitis  Urine sent for culture. Urine with positive nitrite, large hemoglobin, small bilirubin, protein and ketones. Recommend push fluids.  Treating with Macrobid.  Blood work pending.  Final Clinical Impressions(s) / UC Diagnoses   Final diagnoses:  Lupus ([place])  Acute cystitis without hematuria  Essential hypertension     Discharge Instructions     I have drawn a lot of blood work, checking some things. I will call you when I get the results.  You do have a UTI. Treating with antibiotics.  I am starting you on amlodipine for high blood pressure.  I have referred you to primary care and rheumatology.  Your EKG was normal Pregnancy test negative     ED Prescriptions    Medication Sig Dispense Auth. Provider   nitrofurantoin, macrocrystal-monohydrate, (MACROBID) 100 MG capsule Take 1 capsule (100 mg total) by mouth 2 (two) times daily. 10 capsule Britten Seyfried A, NP   amLODipine (NORVASC) 10 MG tablet Take 1 tablet (10 mg total) by mouth daily. 30 tablet  Orvan July, NP     PDMP not reviewed this encounter.   Orvan July, NP 10/17/20 1133

## 2020-10-18 LAB — C3 COMPLEMENT: C3 Complement: 51 mg/dL — ABNORMAL LOW (ref 82–167)

## 2020-10-18 LAB — HIV ANTIBODY (ROUTINE TESTING W REFLEX): HIV Screen 4th Generation wRfx: NONREACTIVE

## 2020-10-18 LAB — C4 COMPLEMENT: Complement C4, Body Fluid: 4 mg/dL — ABNORMAL LOW (ref 12–38)

## 2020-10-19 ENCOUNTER — Telehealth (HOSPITAL_COMMUNITY): Payer: Self-pay | Admitting: Emergency Medicine

## 2020-10-19 LAB — URINE CULTURE: Culture: >=100000 — AB

## 2020-10-19 MED ORDER — SULFAMETHOXAZOLE-TRIMETHOPRIM 800-160 MG PO TABS: 1.0000 | ORAL_TABLET | Freq: Two times a day (BID) | ORAL | 0 refills | Status: AC

## 2020-10-31 DIAGNOSIS — Z992 Dependence on renal dialysis: Secondary | ICD-10-CM

## 2020-10-31 DIAGNOSIS — N186 End stage renal disease: Secondary | ICD-10-CM

## 2020-10-31 HISTORY — DX: End stage renal disease: N18.6

## 2020-10-31 HISTORY — DX: Dependence on renal dialysis: Z99.2

## 2020-11-06 ENCOUNTER — Encounter (HOSPITAL_COMMUNITY): Payer: Self-pay | Admitting: Emergency Medicine

## 2020-11-06 ENCOUNTER — Other Ambulatory Visit: Payer: Self-pay

## 2020-11-06 ENCOUNTER — Emergency Department (HOSPITAL_COMMUNITY)
Admission: EM | Admit: 2020-11-06 | Discharge: 2020-11-06 | Disposition: A | Discharge status: Home / Self Care | Payer: Medicaid Other | Source: Home / Self Care

## 2020-11-06 DIAGNOSIS — N7689 Other specified inflammation of vagina and vulva: Secondary | ICD-10-CM | POA: Insufficient documentation

## 2020-11-06 DIAGNOSIS — R430 Anosmia: Secondary | ICD-10-CM | POA: Insufficient documentation

## 2020-11-06 DIAGNOSIS — R5382 Chronic fatigue, unspecified: Secondary | ICD-10-CM | POA: Insufficient documentation

## 2020-11-06 DIAGNOSIS — Z5321 Procedure and treatment not carried out due to patient leaving prior to being seen by health care provider: Secondary | ICD-10-CM | POA: Insufficient documentation

## 2020-11-06 DIAGNOSIS — R197 Diarrhea, unspecified: Secondary | ICD-10-CM | POA: Insufficient documentation

## 2020-11-06 LAB — I-STAT BETA HCG BLOOD, ED (MC, WL, AP ONLY): I-stat hCG, quantitative: <5 m[IU]/mL (ref <5)

## 2020-11-06 LAB — LIPASE, BLOOD: Lipase: 54 U/L — ABNORMAL HIGH (ref 11–51)

## 2020-11-06 LAB — COMPREHENSIVE METABOLIC PANEL
ALT: 14 U/L (ref 0–44)
AST: 22 U/L (ref 15–41)
Albumin: 1.6 g/dL — ABNORMAL LOW (ref 3.5–5.0)
Alkaline Phosphatase: 84 U/L (ref 38–126)
Anion gap: 8 (ref 5–15)
BUN: 54 mg/dL — ABNORMAL HIGH (ref 6–20)
CO2: 20 mmol/L — ABNORMAL LOW (ref 22–32)
Calcium: 7.1 mg/dL — ABNORMAL LOW (ref 8.9–10.3)
Chloride: 106 mmol/L (ref 98–111)
Creatinine, Ser: 2.61 mg/dL — ABNORMAL HIGH (ref 0.44–1.00)
GFR, Estimated: 24 mL/min — ABNORMAL LOW (ref >60)
Glucose, Bld: 102 mg/dL — ABNORMAL HIGH (ref 70–99)
Potassium: 5.2 mmol/L — ABNORMAL HIGH (ref 3.5–5.1)
Sodium: 134 mmol/L — ABNORMAL LOW (ref 135–145)
Total Bilirubin: 0.3 mg/dL (ref 0.3–1.2)
Total Protein: 5 g/dL — ABNORMAL LOW (ref 6.5–8.1)

## 2020-11-06 LAB — CBC
HCT: 22.9 % — ABNORMAL LOW (ref 36.0–46.0)
Hemoglobin: 7.5 g/dL — ABNORMAL LOW (ref 12.0–15.0)
MCH: 28.7 pg (ref 26.0–34.0)
MCHC: 32.8 g/dL (ref 30.0–36.0)
MCV: 87.7 fL (ref 80.0–100.0)
Platelets: 147 10*3/uL — ABNORMAL LOW (ref 150–400)
RBC: 2.61 MIL/uL — ABNORMAL LOW (ref 3.87–5.11)
RDW: 15.2 % (ref 11.5–15.5)
WBC: 7.7 10*3/uL (ref 4.0–10.5)
nRBC: 0 % (ref 0.0–0.2)

## 2020-11-06 NOTE — ED Triage Notes (Signed)
Patient reports that she is full of fluid with watery bowel movements x3 weeks. Reports excess tiredness. Also reports loss of smell. States her 'lady parts' are also swollen.

## 2020-11-07 ENCOUNTER — Other Ambulatory Visit: Payer: Self-pay

## 2020-11-07 ENCOUNTER — Encounter (HOSPITAL_COMMUNITY): Payer: Self-pay | Admitting: Internal Medicine

## 2020-11-07 ENCOUNTER — Emergency Department (HOSPITAL_COMMUNITY): Payer: Medicaid Other

## 2020-11-07 ENCOUNTER — Inpatient Hospital Stay (HOSPITAL_COMMUNITY)
Admission: EM | Admit: 2020-11-07 | Discharge: 2020-11-13 | DRG: 546 | Disposition: A | Discharge status: Home / Self Care | Payer: Medicaid Other | Attending: Internal Medicine | Admitting: Internal Medicine

## 2020-11-07 DIAGNOSIS — N049 Nephrotic syndrome with unspecified morphologic changes: Secondary | ICD-10-CM | POA: Diagnosis not present

## 2020-11-07 DIAGNOSIS — M3214 Glomerular disease in systemic lupus erythematosus: Principal | ICD-10-CM | POA: Diagnosis present

## 2020-11-07 DIAGNOSIS — E872 Acidosis: Secondary | ICD-10-CM | POA: Diagnosis not present

## 2020-11-07 DIAGNOSIS — I340 Nonrheumatic mitral (valve) insufficiency: Secondary | ICD-10-CM | POA: Diagnosis present

## 2020-11-07 DIAGNOSIS — R1084 Generalized abdominal pain: Secondary | ICD-10-CM

## 2020-11-07 DIAGNOSIS — I10 Essential (primary) hypertension: Secondary | ICD-10-CM | POA: Diagnosis present

## 2020-11-07 DIAGNOSIS — Z1611 Resistance to penicillins: Secondary | ICD-10-CM | POA: Diagnosis not present

## 2020-11-07 DIAGNOSIS — D638 Anemia in other chronic diseases classified elsewhere: Secondary | ICD-10-CM | POA: Diagnosis present

## 2020-11-07 DIAGNOSIS — I3139 Other pericardial effusion (noninflammatory): Secondary | ICD-10-CM

## 2020-11-07 DIAGNOSIS — N1831 Chronic kidney disease, stage 3a: Secondary | ICD-10-CM | POA: Diagnosis not present

## 2020-11-07 DIAGNOSIS — R197 Diarrhea, unspecified: Secondary | ICD-10-CM

## 2020-11-07 DIAGNOSIS — Z833 Family history of diabetes mellitus: Secondary | ICD-10-CM

## 2020-11-07 DIAGNOSIS — I313 Pericardial effusion (noninflammatory): Secondary | ICD-10-CM | POA: Diagnosis present

## 2020-11-07 DIAGNOSIS — Z8249 Family history of ischemic heart disease and other diseases of the circulatory system: Secondary | ICD-10-CM

## 2020-11-07 DIAGNOSIS — N189 Chronic kidney disease, unspecified: Secondary | ICD-10-CM | POA: Diagnosis not present

## 2020-11-07 DIAGNOSIS — Z20822 Contact with and (suspected) exposure to covid-19: Secondary | ICD-10-CM | POA: Diagnosis not present

## 2020-11-07 DIAGNOSIS — R591 Generalized enlarged lymph nodes: Secondary | ICD-10-CM | POA: Diagnosis present

## 2020-11-07 DIAGNOSIS — D649 Anemia, unspecified: Secondary | ICD-10-CM

## 2020-11-07 DIAGNOSIS — M3212 Pericarditis in systemic lupus erythematosus: Secondary | ICD-10-CM | POA: Diagnosis present

## 2020-11-07 DIAGNOSIS — E871 Hypo-osmolality and hyponatremia: Secondary | ICD-10-CM | POA: Diagnosis not present

## 2020-11-07 DIAGNOSIS — Z9114 Patient's other noncompliance with medication regimen: Secondary | ICD-10-CM | POA: Diagnosis not present

## 2020-11-07 DIAGNOSIS — N179 Acute kidney failure, unspecified: Secondary | ICD-10-CM | POA: Diagnosis not present

## 2020-11-07 DIAGNOSIS — M329 Systemic lupus erythematosus, unspecified: Secondary | ICD-10-CM | POA: Diagnosis present

## 2020-11-07 DIAGNOSIS — D631 Anemia in chronic kidney disease: Secondary | ICD-10-CM | POA: Diagnosis not present

## 2020-11-07 DIAGNOSIS — N390 Urinary tract infection, site not specified: Secondary | ICD-10-CM

## 2020-11-07 DIAGNOSIS — B961 Klebsiella pneumoniae [K. pneumoniae] as the cause of diseases classified elsewhere: Secondary | ICD-10-CM | POA: Diagnosis present

## 2020-11-07 DIAGNOSIS — F3130 Bipolar disorder, current episode depressed, mild or moderate severity, unspecified: Secondary | ICD-10-CM | POA: Diagnosis not present

## 2020-11-07 DIAGNOSIS — N183 Chronic kidney disease, stage 3 unspecified: Secondary | ICD-10-CM | POA: Diagnosis not present

## 2020-11-07 DIAGNOSIS — E8809 Other disorders of plasma-protein metabolism, not elsewhere classified: Secondary | ICD-10-CM | POA: Diagnosis present

## 2020-11-07 DIAGNOSIS — Z9119 Patient's noncompliance with other medical treatment and regimen: Secondary | ICD-10-CM

## 2020-11-07 DIAGNOSIS — R059 Cough, unspecified: Secondary | ICD-10-CM | POA: Diagnosis not present

## 2020-11-07 DIAGNOSIS — Z87891 Personal history of nicotine dependence: Secondary | ICD-10-CM | POA: Diagnosis not present

## 2020-11-07 DIAGNOSIS — E8770 Fluid overload, unspecified: Secondary | ICD-10-CM | POA: Diagnosis present

## 2020-11-07 DIAGNOSIS — R601 Generalized edema: Secondary | ICD-10-CM | POA: Diagnosis not present

## 2020-11-07 DIAGNOSIS — E785 Hyperlipidemia, unspecified: Secondary | ICD-10-CM | POA: Diagnosis present

## 2020-11-07 DIAGNOSIS — R109 Unspecified abdominal pain: Secondary | ICD-10-CM

## 2020-11-07 DIAGNOSIS — R0602 Shortness of breath: Secondary | ICD-10-CM | POA: Diagnosis not present

## 2020-11-07 DIAGNOSIS — I700 Atherosclerosis of aorta: Secondary | ICD-10-CM

## 2020-11-07 DIAGNOSIS — R609 Edema, unspecified: Secondary | ICD-10-CM | POA: Diagnosis present

## 2020-11-07 LAB — COMPREHENSIVE METABOLIC PANEL
ALT: 14 U/L (ref 0–44)
AST: 22 U/L (ref 15–41)
Albumin: 1.5 g/dL — ABNORMAL LOW (ref 3.5–5.0)
Alkaline Phosphatase: 89 U/L (ref 38–126)
Anion gap: 9 (ref 5–15)
BUN: 53 mg/dL — ABNORMAL HIGH (ref 6–20)
CO2: 17 mmol/L — ABNORMAL LOW (ref 22–32)
Calcium: 6.9 mg/dL — ABNORMAL LOW (ref 8.9–10.3)
Chloride: 107 mmol/L (ref 98–111)
Creatinine, Ser: 2.5 mg/dL — ABNORMAL HIGH (ref 0.44–1.00)
GFR, Estimated: 25 mL/min — ABNORMAL LOW (ref >60)
Glucose, Bld: 99 mg/dL (ref 70–99)
Potassium: 5.1 mmol/L (ref 3.5–5.1)
Sodium: 133 mmol/L — ABNORMAL LOW (ref 135–145)
Total Bilirubin: 0.4 mg/dL (ref 0.3–1.2)
Total Protein: 4.8 g/dL — ABNORMAL LOW (ref 6.5–8.1)

## 2020-11-07 LAB — URINALYSIS, ROUTINE W REFLEX MICROSCOPIC
Bilirubin Urine: NEGATIVE
Glucose, UA: NEGATIVE mg/dL
Ketones, ur: NEGATIVE mg/dL
Nitrite: NEGATIVE
Protein, ur: >=300 mg/dL — AB
Specific Gravity, Urine: 1.016 (ref 1.005–1.030)
WBC, UA: >50 WBC/hpf — ABNORMAL HIGH (ref 0–5)
pH: 5 (ref 5.0–8.0)

## 2020-11-07 LAB — CBC WITH DIFFERENTIAL/PLATELET
Abs Immature Granulocytes: 0.04 10*3/uL (ref 0.00–0.07)
Basophils Absolute: 0 10*3/uL (ref 0.0–0.1)
Basophils Relative: 0 %
Eosinophils Absolute: 0.2 10*3/uL (ref 0.0–0.5)
Eosinophils Relative: 2 %
HCT: 22.6 % — ABNORMAL LOW (ref 36.0–46.0)
Hemoglobin: 7.3 g/dL — ABNORMAL LOW (ref 12.0–15.0)
Immature Granulocytes: 1 %
Lymphocytes Relative: 18 %
Lymphs Abs: 1.3 10*3/uL (ref 0.7–4.0)
MCH: 27.7 pg (ref 26.0–34.0)
MCHC: 32.3 g/dL (ref 30.0–36.0)
MCV: 85.6 fL (ref 80.0–100.0)
Monocytes Absolute: 0.4 10*3/uL (ref 0.1–1.0)
Monocytes Relative: 5 %
Neutro Abs: 5.3 10*3/uL (ref 1.7–7.7)
Neutrophils Relative %: 74 %
Platelets: 171 10*3/uL (ref 150–400)
RBC: 2.64 MIL/uL — ABNORMAL LOW (ref 3.87–5.11)
RDW: 15.4 % (ref 11.5–15.5)
WBC: 7.2 10*3/uL (ref 4.0–10.5)
nRBC: 0 % (ref 0.0–0.2)

## 2020-11-07 LAB — RAPID HIV SCREEN (HIV 1/2 AB+AG)
HIV 1/2 Antibodies: NONREACTIVE
HIV-1 P24 Antigen - HIV24: NONREACTIVE

## 2020-11-07 LAB — PROTEIN / CREATININE RATIO, URINE
Creatinine, Urine: 200.08 mg/dL
Total Protein, Urine: 1319 mg/dL

## 2020-11-07 LAB — IRON AND TIBC
Iron: 24 ug/dL — ABNORMAL LOW (ref 28–170)
Saturation Ratios: 18 % (ref 10.4–31.8)
TIBC: 135 ug/dL — ABNORMAL LOW (ref 250–450)
UIBC: 111 ug/dL

## 2020-11-07 LAB — LIPID PANEL
Cholesterol: 247 mg/dL — ABNORMAL HIGH (ref 0–200)
HDL: 15 mg/dL — ABNORMAL LOW (ref >40)
LDL Cholesterol: UNDETERMINED mg/dL (ref 0–99)
Total CHOL/HDL Ratio: 16.5 RATIO
Triglycerides: 443 mg/dL — ABNORMAL HIGH (ref <150)
VLDL: UNDETERMINED mg/dL (ref 0–40)

## 2020-11-07 LAB — RESP PANEL BY RT-PCR (FLU A&B, COVID) ARPGX2
Influenza A by PCR: NEGATIVE
Influenza B by PCR: NEGATIVE
SARS Coronavirus 2 by RT PCR: NEGATIVE

## 2020-11-07 LAB — HEMOGLOBIN AND HEMATOCRIT, BLOOD
HCT: 18.6 % — ABNORMAL LOW (ref 36.0–46.0)
Hemoglobin: 6.2 g/dL — CL (ref 12.0–15.0)

## 2020-11-07 LAB — LIPASE, BLOOD: Lipase: 51 U/L (ref 11–51)

## 2020-11-07 LAB — I-STAT BETA HCG BLOOD, ED (MC, WL, AP ONLY): I-stat hCG, quantitative: <5 m[IU]/mL (ref <5)

## 2020-11-07 LAB — FERRITIN: Ferritin: 499 ng/mL — ABNORMAL HIGH (ref 11–307)

## 2020-11-07 LAB — LACTIC ACID, PLASMA: Lactic Acid, Venous: 0.5 mmol/L (ref 0.5–1.9)

## 2020-11-07 LAB — TSH: TSH: 2.355 u[IU]/mL (ref 0.350–4.500)

## 2020-11-07 MED ORDER — HEPARIN SODIUM (PORCINE) 5000 UNIT/ML IJ SOLN
5000.0000 [IU] | Freq: Three times a day (TID) | INTRAMUSCULAR | Status: DC
  Administered 2020-11-07 – 2020-11-13 (×17): 5000 [IU] via SUBCUTANEOUS
  Filled 2020-11-07 (×18): qty 1

## 2020-11-07 MED ORDER — PROCHLORPERAZINE EDISYLATE 10 MG/2ML IJ SOLN
10.0000 mg | Freq: Four times a day (QID) | INTRAMUSCULAR | Status: DC | PRN
  Administered 2020-11-07 – 2020-11-11 (×3): 10 mg via INTRAVENOUS
  Filled 2020-11-07 (×4): qty 2

## 2020-11-07 MED ORDER — FUROSEMIDE 10 MG/ML IJ SOLN
60.0000 mg | Freq: Two times a day (BID) | INTRAMUSCULAR | Status: DC
  Administered 2020-11-07 – 2020-11-10 (×6): 60 mg via INTRAVENOUS
  Filled 2020-11-07: qty 6
  Filled 2020-11-07: qty 8
  Filled 2020-11-07 (×2): qty 6
  Filled 2020-11-07: qty 8
  Filled 2020-11-07: qty 6

## 2020-11-07 MED ORDER — ACETAMINOPHEN 650 MG RE SUPP: 650.0000 mg | Freq: Four times a day (QID) | RECTAL | Status: DC | PRN

## 2020-11-07 MED ORDER — ALBUMIN HUMAN 25 % IV SOLN
50.0000 g | Freq: Four times a day (QID) | INTRAVENOUS | Status: AC
  Administered 2020-11-07 – 2020-11-08 (×3): 50 g via INTRAVENOUS
  Filled 2020-11-07 (×4): qty 200

## 2020-11-07 MED ORDER — SODIUM CHLORIDE 0.9 % IV SOLN: INTRAVENOUS | Status: DC

## 2020-11-07 MED ORDER — HYDROCODONE-ACETAMINOPHEN 5-325 MG PO TABS
1.0000 | ORAL_TABLET | ORAL | Status: DC | PRN
  Administered 2020-11-08 – 2020-11-09 (×2): 1 via ORAL
  Filled 2020-11-07 (×2): qty 1

## 2020-11-07 MED ORDER — POLYETHYLENE GLYCOL 3350 17 G PO PACK: 17.0000 g | PACK | Freq: Every day | ORAL | Status: DC | PRN

## 2020-11-07 MED ORDER — SODIUM CHLORIDE 0.9% IV SOLUTION: Freq: Once | INTRAVENOUS | Status: DC

## 2020-11-07 MED ORDER — IOHEXOL 9 MG/ML PO SOLN
500.0000 mL | ORAL | Status: AC
  Administered 2020-11-07: 500 mL via ORAL

## 2020-11-07 MED ORDER — ACETAMINOPHEN 325 MG PO TABS
650.0000 mg | ORAL_TABLET | Freq: Four times a day (QID) | ORAL | Status: DC | PRN
  Administered 2020-11-07 – 2020-11-10 (×3): 650 mg via ORAL
  Filled 2020-11-07 (×3): qty 2

## 2020-11-07 MED ORDER — INFLUENZA VAC SPLIT QUAD 0.5 ML IM SUSY
0.5000 mL | PREFILLED_SYRINGE | INTRAMUSCULAR | Status: DC
  Filled 2020-11-07: qty 0.5

## 2020-11-07 MED ORDER — MORPHINE SULFATE (PF) 2 MG/ML IV SOLN: 2.0000 mg | INTRAVENOUS | Status: DC | PRN

## 2020-11-07 MED ORDER — SODIUM CHLORIDE 0.9 % IV SOLN
1.0000 g | INTRAVENOUS | Status: DC
  Administered 2020-11-07 – 2020-11-10 (×4): 1 g via INTRAVENOUS
  Filled 2020-11-07 (×2): qty 1
  Filled 2020-11-07 (×3): qty 10

## 2020-11-07 NOTE — ED Triage Notes (Signed)
Pt reports whole body swelling, throat burning, cough, painful urination, chest pain x3 weeks.

## 2020-11-07 NOTE — H&P (Addendum)
History and Physical        Hospital Admission Note Date: 11/07/2020  Patient name: Cathy [name] Medical record number: [id number] Date of birth: [birth year] Age: 39 y.o. Gender: female  PCP: Patient, No Pcp Per   Chief Complaint    Chief Complaint  Patient presents with  . Dysuria  . Leg Swelling    bilateral  . Cough      HPI:   This is a 39 year old female with past medical history of bipolar depression, SLE, PID, renal hypertension, substance abuse who presented to the ED with 3 weeks of multiple nonspecific complaints most notably abdominal discomfort and distention.  Associated with lower extremity and abdominal edema.  Has a mild cough with some chest tightness associated with coughing and DOE.  Also some wheezing.  No fever or chills.  She was recently seen in the urgent care on 11/17 at which time she had similar complaints and found to have a UTI, hypertensive urgency and was referred to primary care and rheumatology.  She has not been seen by either.  States she ran out of her amlodipine 2 weeks ago and is currently not taking any medications at all.  Denies any alcohol or drug use.  On presentation she complained of watery bowel movements x3 weeks which started before taking the antibiotics, also change in taste which is slightly metallic and fatigue.  Denies any blood in her stool and has not had her period for 1 month and denies any vaginal bleeding.  States she is able to make urine   ED Course: Afebrile, hypertensive, on room air. Notable Labs: Sodium 133, K5.1, CO2 17, BUN 53, creatinine 2.5 (baseline 0.8 in September), calcium 6.9, albumin 1.5, total protein 4.8, WBC 7.2, Hb 7.3 (7.5 on 12th of 7 and was 9.4 in 11/17).  UA positive for infection. Notable Imaging: CXR-cardiomegaly otherwise unremarkable.  CT abdomen pelvis without contrast-anasarca, marked gastric  fold thickening may be due to hypoproteinemia or severe anasarca, house troll thickening of the colon, generalized nodal enlargement seen mainly in the pelvis, bilateral perinephric stranding, lingular consolidative changes possibly due to volume loss though they are not well assessed, aortic atherosclerosis. Patient received ceftriaxone.    Vitals:   11/07/20 1509 11/07/20 1610  BP: (!) 155/94 (!) 165/124  Pulse: 76 79  Resp: (!) 23 (!) 24  Temp:    SpO2: 99% 100%     Review of Systems:  Review of Systems  Constitutional: Positive for malaise/fatigue. Negative for fever.  Eyes: Positive for blurred vision.  Respiratory: Positive for cough.   Cardiovascular: Positive for leg swelling.  Gastrointestinal: Negative for blood in stool.  Genitourinary: Positive for dysuria.  All other systems reviewed and are negative.   Medical/Social/Family History   Past Medical History: Past Medical History:  Diagnosis Date  . Anxiety   . Asthma   . Bipolar depression ([place])   . Depression   . Gonorrhea   . Lupus ([place])   . Pelvic inflammatory disease (PID)   . Renal hypertension   . Trichomonas infection     Past Surgical History:  Procedure Laterality Date  . RENAL BIOPSY    . TUBAL LIGATION N/A 02/03/2019  Procedure: POST PARTUM TUBAL LIGATION;  Surgeon: Aletha Halim, MD;  Location: [facility] LD ORS;  Service: Gynecology;  Laterality: N/A;    Medications: Prior to Admission medications   Medication Sig Start Date End Date Taking? Authorizing Provider  amLODipine (NORVASC) 10 MG tablet Take 1 tablet (10 mg total) by mouth daily. 10/17/20   Loura Halt A, NP  azaTHIOprine (IMURAN) 50 MG tablet Take 1 tablet (50 mg total) by mouth 2 (two) times daily. Patient not taking: Reported on 01/28/2019 09/29/18   Jonetta Osgood, MD  famotidine (PEPCID) 20 MG tablet Take 1 tablet (20 mg total) by mouth 2 (two) times daily. 10/18/18   Truett Mainland, DO  labetalol (NORMODYNE) 200 MG tablet  Take 2 tablets (400 mg total) by mouth 2 (two) times daily. Patient not taking: Reported on 03/31/2019 02/06/19   Woodroe Mode, MD  lurasidone (LATUDA) 20 MG TABS tablet Take 1 tablet (20 mg total) by mouth at bedtime. 03/03/19   Truett Mainland, DO  mycophenolate (CELLCEPT) 500 MG tablet Take 500 mg by mouth in the morning and at bedtime. 01/06/20 01/05/21  [provider]  nitrofurantoin, macrocrystal-monohydrate, (MACROBID) 100 MG capsule Take 1 capsule (100 mg total) by mouth 2 (two) times daily. 10/17/20   Loura Halt A, NP  predniSONE (DELTASONE) 20 MG tablet Take 2 tablets (40 mg total) by mouth daily with breakfast. 09/29/18   Ghimire, Henreitta Leber, MD    Allergies:  No Known Allergies  Social History:  reports that she quit smoking about 4 weeks ago. Her smoking use included cigarettes. She smoked 0.25 packs per day. She has never used smokeless tobacco. She reports previous alcohol use. She reports previous drug use. Drug: Marijuana.  Family History: Family History  Problem Relation Age of Onset  . Diabetes Maternal Grandmother   . Hypertension Maternal Grandmother   . Diabetes Maternal Grandfather   . Hypertension Maternal Grandfather   . Diabetes Paternal Grandmother   . Hypertension Paternal Grandmother   . Diabetes Paternal Grandfather   . Hypertension Paternal Grandfather      Objective   Physical Exam: Blood pressure (!) 165/124, pulse 79, temperature 99.3 F (37.4 C), temperature source Oral, resp. rate (!) 24, SpO2 100 %, unknown if currently breastfeeding.  Physical Exam Vitals and nursing note reviewed. Exam conducted with a chaperone present.  Constitutional:      Comments: Chronically ill-appearing  HENT:     Head:     Comments: Alopecia    Mouth/Throat:     Mouth: Mucous membranes are moist.  Eyes:     Conjunctiva/sclera: Conjunctivae normal.  Cardiovascular:     Rate and Rhythm: Normal rate and regular rhythm.  Pulmonary:     Effort: Pulmonary  effort is normal.     Breath sounds: Normal breath sounds.  Abdominal:     General: There is distension.     Tenderness: There is abdominal tenderness.  Musculoskeletal:     Comments: 3+ bilateral lower extremity edema  Neurological:     Mental Status: She is alert. Mental status is at baseline.  Psychiatric:        Mood and Affect: Affect is tearful.     LABS on Admission: I have personally reviewed all the labs and imaging below    Basic Metabolic Panel: Recent Labs  Lab 11/06/20 1759 11/07/20 1104  NA 134* 133*  K 5.2* 5.1  CL 106 107  CO2 20* 17*  GLUCOSE 102* 99  BUN 54*  53*  CREATININE 2.61* 2.50*  CALCIUM 7.1* 6.9*   Liver Function Tests: Recent Labs  Lab 11/06/20 1759 11/07/20 1104  AST 22 22  ALT 14 14  ALKPHOS 84 89  BILITOT 0.3 0.4  PROT 5.0* 4.8*  ALBUMIN 1.6* 1.5*   Recent Labs  Lab 11/06/20 1759 11/07/20 1104  LIPASE 54* 51   No results for input(s): AMMONIA in the last 168 hours. CBC: Recent Labs  Lab 11/06/20 1759 11/06/20 1759 11/07/20 1104  WBC 7.7  --  7.2  NEUTROABS  --   --  5.3  HGB 7.5*  --  7.3*  HCT 22.9*  --  22.6*  MCV 87.7   < > 85.6  PLT 147*  --  171   < > = values in this interval not displayed.   Cardiac Enzymes: No results for input(s): CKTOTAL, CKMB, CKMBINDEX, TROPONINI in the last 168 hours. BNP: Invalid input(s): POCBNP CBG: No results for input(s): GLUCAP in the last 168 hours.  Radiological Exams on Admission:  CT ABDOMEN PELVIS WO CONTRAST  Addendum Date: 11/07/2020   ADDENDUM REPORT: 11/07/2020 15:12 ADDENDUM: These results were called by telephone at the time of interpretation on 11/07/2020 at 3:12 pm to provider Lacretia Leigh , who verbally acknowledged these results. Electronically Signed   By: Zetta Bills M.D.   On: 11/07/2020 15:12   Result Date: 11/07/2020 CLINICAL DATA:  Abdominal distension, throat burning and cough EXAM: CT ABDOMEN AND PELVIS WITHOUT CONTRAST TECHNIQUE: Multidetector CT  imaging of the abdomen and pelvis was performed following the standard protocol without IV contrast. COMPARISON:  CT angiography of the chest of February 01, 2019 FINDINGS: Lower chest: Cardiomegaly. Heart is incompletely imaged. Volume loss in the LEFT lung base. Moderate pericardial effusion with low-attenuation of cardiac blood pool compatible with anemia. Hepatobiliary: Liver with normal contour and size. No gallbladder distension. Pancreas: Normal contour of the pancreas. Generalized stranding throughout the abdomen not concentrated in the area of the pancreas in seen in the setting of anasarca is of uncertain significance. Spleen: Spleen normal size.  Normal contour of the spleen. Adrenals/Urinary Tract: Adrenal glands normal. Bilateral perinephric stranding. No signs of hydronephrosis. No radiopaque calculi. Urinary bladder under distended limiting assessment. Stomach/Bowel: Fold thickening, a very pronounced fold thickening in the stomach. Generalized edema in the small bowel mesentery. Haustral thickening throughout the entire colon. Bowel edema in the small bowel without focal abnormality. No sign of bowel obstruction. Dilute contrast extends into the colon, through the small bowel. Appendix is normal Vascular/Lymphatic: No aneurysmal dilation of the abdominal aorta. Smooth contour of the IVC. 17 mm LEFT para-aortic lymph node. No additional adenopathy visualized in the upper abdomen though there is extensive edema which limits assessment. Anasarca blurs boundaries between fat, solid and hollow viscera in the abdomen and musculoskeletal structures in the body wall. Enlarged lymph nodes about the bilateral inguinal regions and in the pelvis (image 68, series 2) 1.4 cm LEFT external iliac lymph node and generalized nodal enlargement with similar size in the bilateral external iliac chains and along is the RIGHT and LEFT groin. Reproductive: Bilateral tubal ligation clips. No adnexal masses. Limited assessment on  noncontrast imaging. Other: Small volume ascites in the pelvis.  No free air. Musculoskeletal: No acute bone finding. No destructive bone process. IMPRESSION: 1. Anasarca with signs of anemia. 2. Marked gastric fold thickening may be due to hyperproteinemia or severe anasarca. Correlate with symptoms of gastritis and consider endoscopic assessment for further evaluation. 3. Haustral thickening  of the colon also may be related to anasarca, correlate with any symptoms of colitis. Would also consider correlation with lactate though again findings are diffuse, generalized in seen in the setting of anasarca. 4. Generalized nodal enlargement seen mainly in the pelvis findings are nonspecific, raising the question of lymphoproliferative disorder and not well evaluated given lack of contrast and diffuse marked anasarca. Would also correlate with processes such as HIV infection which could cause generalized nodal enlargement. 5. Bilateral perinephric stranding. Seen in the setting of anasarca without signs of hydronephrosis. 6. Lingular consolidative changes partially visualized may simply be due to volume loss though are not well assessed. Consider follow-up to ensure resolution. Correlate with any respiratory symptoms. 7. Aortic atherosclerosis. Aortic Atherosclerosis (ICD10-I70.0). Electronically Signed: By: Zetta Bills M.D. On: 11/07/2020 15:04   DG Chest Port 1 View  Result Date: 11/07/2020 CLINICAL DATA:  Shortness of breath, cough EXAM: PORTABLE CHEST 1 VIEW COMPARISON:  02/01/2019 FINDINGS: Cardiomegaly. No confluent opacities or effusions. No edema. No acute bony abnormality. IMPRESSION: Cardiomegaly.  No active disease. Electronically Signed   By: Rolm Baptise M.D.   On: 11/07/2020 11:53      EKG: not done   A & P   Principal Problem:   Anasarca Active Problems:   Lupus ([place])   Acute renal failure (ARF) ([facility])   Anemia   Aortic atherosclerosis ([facility])   Abdominal pain    Diarrhea   1. Abdominal pain likely secondary to anasarca with hypoalbuminemia a. Albumin 1.5 b. Appreciate nephrology help with volume control c. Will check an Echo to evaluate EF  2. AKI a. Could be from lupus nephritis b. Appreciate nephrology recommendations, discussed with Dr. Carolin Sicks  3. UTI with history of recent Klebsiella pneumonia UTI a. Treated with nitrofurantoin on 11/17 although the culture from that date grew Klebsiella pneumonia which had intermediate resistance to nitrofurantoin b. Continue ceftriaxone c. Follow-up urine cultures  4. Diarrhea a. Probably from anasarca b. Check stool studies including C. difficile  5. SLE, Possibly in an exacerbation a. Lymphadenopathy on CT abdomen pelvis b. Not compliant with her medications c. ANeeds to follow-up with rheumatology at discharge  6. Acute on chronic anemia a. Check iron studies but I suspect anemia of chronic disease b. Trend Hb and transfuse if less than 7.0  7. Aortic atherosclerosis a. Lipid panel   DVT prophylaxis: heparin   Code Status: Full Code  Diet: renal Family Communication: Admission, patients condition and plan of care including tests being ordered have been discussed with the patient who indicates understanding and agrees with the plan and Code Status. Patient's family was updated  Disposition Plan: The appropriate patient status for this patient is INPATIENT. Inpatient status is judged to be reasonable and necessary in order to provide the required intensity of service to ensure the patient's safety. The patient's presenting symptoms, physical exam findings, and initial radiographic and laboratory data in the context of their chronic comorbidities is felt to place them at high risk for further clinical deterioration. Furthermore, it is not anticipated that the patient will be medically stable for discharge from the hospital within 2 midnights of admission. The following factors support the  patient status of inpatient.   " The patient's presenting symptoms include abdominal pain, abdominal distention, edema, cough, diarrhea. " The worrisome physical exam findings include tearful, abdominal distention, edema. " The initial radiographic and laboratory data are worrisome because of acute on chronic anemia, abnormal CT abdomen pelvis. " The chronic co-morbidities include  poorly controlled SLE.   * I certify that at the point of admission it is my clinical judgment that the patient will require inpatient hospital care spanning beyond 2 midnights from the point of admission due to high intensity of service, high risk for further deterioration and high frequency of surveillance required.*   The medical decision making on this patient was of high complexity and the patient is at high risk for clinical deterioration, therefore this is a level 3  admission.  Consultants  . Nephrology  Procedures  . none  Time Spent on Admission: 70 minutes    Harold Hedge, DO [facility]  11/07/2020, 4:24 PM

## 2020-11-07 NOTE — ED Notes (Signed)
Date and time results received: 11/07/20 11:28 PM  Test: Hemoglobin Critical Value: 6.2  Name of Provider Notified: Sharlet Salina  Orders Received? Or Actions Taken?: Actions Taken: MD notified

## 2020-11-07 NOTE — ED Notes (Signed)
Bladder scanned pt 3x each showed 69ml

## 2020-11-07 NOTE — Consult Note (Addendum)
[facility] Nephrology Consultation Note  Requesting MD: Dr Neysa Bonito, jared Reason for consult: AKI, anasarca  HPI:  Cathy GRUPE is a 39 y.o. female with history of hypertension, bipolar disorder, anxiety, diagnosed with lupus nephritis in 2019 when she was pregnant with AKI, nonadherence with outpatient follow-up, presented with generalized body pain, anasarca seen as a consultation for the evaluation of worsening renal failure and fluid overload. In 08/2018, she was seen by nephrology team per acute kidney injury and nephrotic syndrome with 6.5 g of proteinuria, reatinine level of 2.5.  She was pregnant at that time.  The serology including ANCA, anti-GBM were negative however ANA was positive.  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  She was treated with a steroid and Imuran with improvement of creatinine level.  She was subsequently seen by Dr. Marval Regal in 2020 at [facility].  She was a started on lisinopril with prednisone taper.  Since then patient missed multiple appointment.  She stopped taking any medications. She now presents with generalized body pain, generalized body swelling/anasarca and overall not feeling well worsening for last few days to weeks.  Reports dysuria, urgency, body pain and overall not feeling well. In the ER, the blood pressure was 177/120, in room air.  The labs showed BUN 54, creatinine level 2.61, albumin level 1.5, potassium 5.2, sodium 134, hemoglobin 7.5.  Urine studies showed cloudy urine with proteinuria and RBCs.  Urine also has many bacteria therefore restarted on ceftriaxone. Patient denies use of any NSAIDs.  She has not taken any medication.  Not seen by any doctors.  She does not follow with a rheumatologist.  Her brother is at the bedside in ER. Patient states she had a bilateral tubal ligation for family-planning.  Creatinine, Ser  Date/Time Value  Ref Range Status  11/07/2020 11:04 AM 2.50 (H) 0.44 - 1.00 mg/dL Final  11/06/2020 05:59 PM 2.61 (H) 0.44 - 1.00 mg/dL Final  10/17/2020 10:15 AM 1.77 (H) 0.44 - 1.00 mg/dL Final  08/07/2020 12:09 PM 0.80 0.44 - 1.00 mg/dL Final  02/05/2019 06:45 AM 1.12 (H) 0.44 - 1.00 mg/dL Final  02/04/2019 09:02 AM 1.17 (H) 0.44 - 1.00 mg/dL Final  02/03/2019 10:53 AM 1.08 (H) 0.44 - 1.00 mg/dL Final  02/03/2019 06:42 AM 1.18 (H) 0.44 - 1.00 mg/dL Final  02/03/2019 12:03 AM 1.20 (H) 0.44 - 1.00 mg/dL Final    PMHx:   Past Medical History:  Diagnosis Date  . Anxiety   . Asthma   . Bipolar depression ([place])   . Depression   . Gonorrhea   . Lupus ([place])   . Pelvic inflammatory disease (PID)   . Renal hypertension   . Trichomonas infection     Past Surgical History:  Procedure Laterality Date  . RENAL BIOPSY    . TUBAL LIGATION N/A 02/03/2019   Procedure: POST PARTUM TUBAL LIGATION;  Surgeon: Aletha Halim, MD;  Location: [facility];  Service: Gynecology;  Laterality: N/A;    Family Hx:  Family History  Problem Relation Age of Onset  . Diabetes Maternal Grandmother   . Hypertension Maternal Grandmother   . Diabetes Maternal Grandfather   . Hypertension Maternal Grandfather   . Diabetes Paternal Grandmother   . Hypertension Paternal Grandmother   . Diabetes Paternal Grandfather   . Hypertension Paternal Grandfather     Social History:  reports that she quit smoking about 4 weeks ago. Her smoking  use included cigarettes. She smoked 0.25 packs per day. She has never used smokeless tobacco. She reports previous alcohol use. She reports previous drug use. Drug: Marijuana.  Allergies: No Known Allergies  Medications: Prior to Admission medications   Medication Sig Start Date End Date Taking? Authorizing Provider  amLODipine (NORVASC) 10 MG tablet Take 1 tablet (10 mg total) by mouth daily. 10/17/20  Yes Bast, Traci A, NP  azaTHIOprine (IMURAN) 50 MG tablet Take 1 tablet (50 mg total) by  mouth 2 (two) times daily. Patient not taking: Reported on 11/07/2020 09/29/18   Jonetta Osgood, MD  famotidine (PEPCID) 20 MG tablet Take 1 tablet (20 mg total) by mouth 2 (two) times daily. Patient not taking: Reported on 11/07/2020 10/18/18   Truett Mainland, DO  labetalol (NORMODYNE) 200 MG tablet Take 2 tablets (400 mg total) by mouth 2 (two) times daily. Patient not taking: Reported on 03/31/2019 02/06/19   Woodroe Mode, MD  lurasidone (LATUDA) 20 MG TABS tablet Take 1 tablet (20 mg total) by mouth at bedtime. Patient not taking: Reported on 11/07/2020 03/03/19   Truett Mainland, DO  nitrofurantoin, macrocrystal-monohydrate, (MACROBID) 100 MG capsule Take 1 capsule (100 mg total) by mouth 2 (two) times daily. Patient not taking: Reported on 11/07/2020 10/17/20   Loura Halt A, NP  predniSONE (DELTASONE) 20 MG tablet Take 2 tablets (40 mg total) by mouth daily with breakfast. Patient not taking: Reported on 11/07/2020 09/29/18   Jonetta Osgood, MD    I have reviewed the patient's current medications.  Labs:  Results for orders placed or performed during the hospital encounter of 11/07/20 (from the past 48 hour(s))  CBC with Differential/Platelet     Status: Abnormal   Collection Time: 11/07/20 11:04 AM  Result Value Ref Range   WBC 7.2 4.0 - 10.5 K/uL   RBC 2.64 (L) 3.87 - 5.11 MIL/uL   Hemoglobin 7.3 (L) 12.0 - 15.0 g/dL   HCT 22.6 (L) 36 - 46 %   MCV 85.6 80.0 - 100.0 fL   MCH 27.7 26.0 - 34.0 pg   MCHC 32.3 30.0 - 36.0 g/dL   RDW 15.4 11.5 - 15.5 %   Platelets 171 150 - 400 K/uL   nRBC 0.0 0.0 - 0.2 %   Neutrophils Relative % 74 %   Neutro Abs 5.3 1.7 - 7.7 K/uL   Lymphocytes Relative 18 %   Lymphs Abs 1.3 0.7 - 4.0 K/uL   Monocytes Relative 5 %   Monocytes Absolute 0.4 0.1 - 1.0 K/uL   Eosinophils Relative 2 %   Eosinophils Absolute 0.2 0.0 - 0.5 K/uL   Basophils Relative 0 %   Basophils Absolute 0.0 0.0 - 0.1 K/uL   Immature Granulocytes 1 %   Abs Immature  Granulocytes 0.04 0.00 - 0.07 K/uL   Reactive, Benign Lymphocytes PRESENT     Comment: Performed at [facility], [place] [address] [place], [place] [address]  Comprehensive metabolic panel     Status: Abnormal   Collection Time: 11/07/20 11:04 AM  Result Value Ref Range   Sodium 133 (L) 135 - 145 mmol/L   Potassium 5.1 3.5 - 5.1 mmol/L   Chloride 107 98 - 111 mmol/L   CO2 17 (L) 22 - 32 mmol/L   Glucose, Bld 99 70 - 99 mg/dL    Comment: Glucose reference range applies only to samples taken after fasting for at least 8 hours.   BUN 53 (H) 6 -  20 mg/dL   Creatinine, Ser 2.50 (H) 0.44 - 1.00 mg/dL   Calcium 6.9 (L) 8.9 - 10.3 mg/dL   Total Protein 4.8 (L) 6.5 - 8.1 g/dL   Albumin 1.5 (L) 3.5 - 5.0 g/dL   AST 22 15 - 41 U/L   ALT 14 0 - 44 U/L   Alkaline Phosphatase 89 38 - 126 U/L   Total Bilirubin 0.4 0.3 - 1.2 mg/dL   GFR, Estimated 25 (L) >60 mL/min    Comment: (NOTE) Calculated using the CKD-EPI Creatinine Equation (2021)    Anion gap 9 5 - 15    Comment: Performed at [facility], [place] [address], [place], [place] [address]  Lipase, blood     Status: None   Collection Time: 11/07/20 11:04 AM  Result Value Ref Range   Lipase 51 11 - 51 U/L    Comment: Performed at [facility], [place] [address], [place], [place] [address]  Resp Panel by RT-PCR (Flu A&B, Covid) Nasopharyngeal Swab     Status: None   Collection Time: 11/07/20 11:06 AM   Specimen: Nasopharyngeal Swab; Nasopharyngeal(NP) swabs in vial transport medium  Result Value Ref Range   SARS Coronavirus 2 by RT PCR NEGATIVE NEGATIVE    Comment: (NOTE) SARS-CoV-2 target nucleic acids are NOT DETECTED.  The SARS-CoV-2 RNA is generally detectable in upper respiratory specimens during the acute phase of infection. The lowest concentration of SARS-CoV-2 viral copies this assay can detect is 138 copies/mL. A negative result does not preclude SARS-Cov-2 infection and should  not be used as the sole basis for treatment or other patient management decisions. A negative result may occur with  improper specimen collection/handling, submission of specimen other than nasopharyngeal swab, presence of viral mutation(s) within the areas targeted by this assay, and inadequate number of viral copies(<138 copies/mL). A negative result must be combined with clinical observations, patient history, and epidemiological information. The expected result is Negative.  Fact Sheet for Patients:  EntrepreneurPulse.com.au  Fact Sheet for Healthcare Providers:  IncredibleEmployment.be  This test is no t yet approved or cleared by the [place] FDA and  has been authorized for detection and/or diagnosis of SARS-CoV-2 by FDA under an Emergency Use Authorization (EUA). This EUA will remain  in effect (meaning this test can be used) for the duration of the COVID-19 declaration under Section 564(b)(1) of the Act, 21 U.S.C.section 360bbb-3(b)(1), unless the authorization is terminated  or revoked sooner.       Influenza A by PCR NEGATIVE NEGATIVE   Influenza B by PCR NEGATIVE NEGATIVE    Comment: (NOTE) The Xpert Xpress SARS-CoV-2/FLU/RSV plus assay is intended as an aid in the diagnosis of influenza from Nasopharyngeal swab specimens and should not be used as a sole basis for treatment. Nasal washings and aspirates are unacceptable for Xpert Xpress SARS-CoV-2/FLU/RSV testing.  Fact Sheet for Patients: EntrepreneurPulse.com.au  Fact Sheet for Healthcare Providers: IncredibleEmployment.be  This test is not yet approved or cleared by the [place] FDA and has been authorized for detection and/or diagnosis of SARS-CoV-2 by FDA under an Emergency Use Authorization (EUA). This EUA will remain in effect (meaning this test can be used) for the duration of the COVID-19 declaration under Section 564(b)(1)  of the Act, 21 U.S.C. section 360bbb-3(b)(1), unless the authorization is terminated or revoked.  Performed at [facility], [place] [address] [place], [place] [address]   I-Stat beta hCG blood, ED ([facility], [facility], [facility] only)  Status: None   Collection Time: 11/07/20 11:13 AM  Result Value Ref Range   I-stat hCG, quantitative <5.0 <5 mIU/mL   Comment 3            Comment:   GEST. AGE      CONC.  (mIU/mL)   <=1 WEEK        5 - 50     2 WEEKS       50 - 500     3 WEEKS       100 - 10,000     4 WEEKS     1,000 - 30,000        FEMALE AND NON-PREGNANT FEMALE:     LESS THAN 5 mIU/mL   Urinalysis, Routine w reflex microscopic Urine, Clean Catch     Status: Abnormal   Collection Time: 11/07/20 12:49 PM  Result Value Ref Range   Color, Urine YELLOW YELLOW   APPearance CLOUDY (A) CLEAR   Specific Gravity, Urine 1.016 1.005 - 1.030   pH 5.0 5.0 - 8.0   Glucose, UA NEGATIVE NEGATIVE mg/dL   Hgb urine dipstick LARGE (A) NEGATIVE   Bilirubin Urine NEGATIVE NEGATIVE   Ketones, ur NEGATIVE NEGATIVE mg/dL   Protein, ur >=300 (A) NEGATIVE mg/dL   Nitrite NEGATIVE NEGATIVE   Leukocytes,Ua SMALL (A) NEGATIVE   RBC / HPF 21-50 0 - 5 RBC/hpf   WBC, UA >50 (H) 0 - 5 WBC/hpf   Bacteria, UA MANY (A) NONE SEEN   Squamous Epithelial / LPF 0-5 0 - 5   WBC Clumps PRESENT    Hyaline Casts, UA PRESENT     Comment: Performed at [facility], [place] [address] [place], [place] [address]  Lactic acid, plasma     Status: None   Collection Time: 11/07/20  3:42 PM  Result Value Ref Range   Lactic Acid, Venous 0.5 0.5 - 1.9 mmol/L    Comment: Performed at [facility], [place] [address], [place], [place] [address]     ROS:  Pertinent items noted in HPI and remainder of comprehensive ROS otherwise negative.  Physical Exam: Vitals:   11/07/20 1509 11/07/20 1610  BP: (!) 155/94 (!) 165/124  Pulse: 76 79  Resp: (!) 23 (!) 24  Temp:    SpO2: 99% 100%      General exam: Appears calm and comfortable  Respiratory system: Reduced breath sound bilateral Cardiovascular system: S1 & S2 heard, RRR.  Bilateral legs edema present Gastrointestinal system: Abdomen is distended, soft and nontender. Normal bowel sounds heard. Central nervous system: Alert and oriented. No focal neurological deficits. Extremities: Symmetric 5 x 5 power. Skin: No rashes, lesions or ulcers Psychiatry: Judgement and insight appear normal. Mood & affect appropriate.   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  She has biopsy-proven class III lupus nephritis in 2019.  She lost follow-up and did not take her medication.   UA with UTI, proteinuria.  Agree with antibiotics, follow-up culture results Quantify proteinuria, CT scan of abdomen pelvis ruled out obstruction.  Perinephric stranding probably because of UTI versus fluid overload. I will send lupus serologies to evaluate the lupus activity.  After few doses of antibiotics, I think she will benefit from empiric steroid and CellCept. She has bilateral tubal ligation for family-planning.  But for today, I will start IV Lasix in addition to IV albumin.  Given nonadherence  with treatment and follow-ups, I do not see the benefit of doing another kidney biopsy. Bladder scan, strict ins and out Avoid hypotension, NSAIDs, IV contrast.  #History of lupus nephritis class III: She had kidney biopsy in 08/2018 for nephrotic syndrome and AKI which showed focal lupus nephritis mildly active and minimally sclerosing phase.  Management as above.  She was not compliant with outpatient treatment and follow-up.  She was never seen by rheumatologist.  #Hypertension/volume: Starting diuretics.  Monitor blood pressure.  May need to add antihypertensives after maintaining euvolemia.  #Anemia, probably due to lupus: Check iron and  further management per primary team.  #Abdomen pain/anasarca/nephrotic syndrome: Due to fluid overload.  Getting echo per primary team.  Thank you for the consult.  We will follow with you.  Trishia Cuthrell Tanna Furry 11/07/2020, 4:35 PM  [facility].

## 2020-11-07 NOTE — ED Provider Notes (Signed)
[facility] DEPT Provider Note   CSN: [id number] Arrival date & time: 11/07/20  0944     History Chief Complaint  Patient presents with  . Dysuria  . Leg Swelling    bilateral  . Cough    Cathy [name] is a 39 y.o. female.  39 year old female with history of lupus who presents with 3 weeks of abdominal discomfort with associated distention.  Also has noted increase edema to her lower abdomen as well as lower extremities.  Has had a mild cough with some chest tightness associated with coughing.  Denies any fever or chills.  Some dyspnea on exertion.  Has had some audible wheezing.  Denies any vaginal bleeding or discharge.  Is not currently being treated for lupus.  Has been hospitalized for lupus in the past.        Past Medical History:  Diagnosis Date  . Anxiety   . Asthma   . Bipolar depression ([employer])   . Depression   . Gonorrhea   . Lupus ([place])   . Pelvic inflammatory disease (PID)   . Renal hypertension   . Trichomonas infection     Patient Active Problem List   Diagnosis Date Noted  . 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 Tubal ligation evaluation 02/03/2019  . Renal hypertension 02/02/2019  . Pulmonary edema 02/01/2019  . AMA (advanced maternal age) multigravida 35+ 01/12/2019  . Non compliance w medication regimen 01/12/2019  . Current chronic use of systemic steroids 01/12/2019  . Unwanted fertility 12/10/2018  . Anemia 10/12/2018  . Supervision of high risk pregnancy, antepartum 10/04/2018  . Lupus ([place]) 09/20/2018  . Supervision of pregnancy with grand multiparity in second trimester 09/20/2018  . Substance abuse ([place]) 09/20/2018  . Acute renal failure (ARF) ([place]) 09/20/2018  . MDD (major depressive disorder) 10/11/2015  . Asthma 09/22/2011    Past Surgical History:  Procedure Laterality Date  . RENAL BIOPSY    . TUBAL LIGATION N/A 02/03/2019   Procedure: POST PARTUM TUBAL  LIGATION;  Surgeon: Aletha Halim, MD;  Location: [facility] LD [facility]S;  Service: Gynecology;  Laterality: N/A;     OB History    Gravida  6   Para  5   Term  4   Preterm  1   AB  1   Living  5     SAB  1   TAB  0   Ectopic  0   Multiple  0   Live Births  5           Family History  Problem Relation Age of Onset  . Diabetes Maternal Grandmother   . Hypertension Maternal Grandmother   . Diabetes Maternal Grandfather   . Hypertension Maternal Grandfather   . Diabetes Paternal Grandmother   . Hypertension Paternal Grandmother   . Diabetes Paternal Grandfather   . Hypertension Paternal Grandfather     Social History   Tobacco Use  . Smoking status: Current Some Day Smoker    Packs/day: 0.25    Types: Cigarettes  . Smokeless tobacco: Never Used  Vaping Use  . Vaping Use: Never used  Substance Use Topics  . Alcohol use: Not Currently  . Drug use: Not Currently    Types: Marijuana    Comment: 3 times a week     Home Medications Prior to Admission medications   Medication Sig Start Date End Date Taking? Authorizing Provider  amLODipine (NORVASC) 10 MG tablet Take 1  tablet (10 mg total) by mouth daily. 10/17/20   Loura Halt A, NP  azaTHIOprine (IMURAN) 50 MG tablet Take 1 tablet (50 mg total) by mouth 2 (two) times daily. Patient not taking: Reported on 01/28/2019 09/29/18   Jonetta Osgood, MD  famotidine (PEPCID) 20 MG tablet Take 1 tablet (20 mg total) by mouth 2 (two) times daily. 10/18/18   Truett Mainland, DO  labetalol (NORMODYNE) 200 MG tablet Take 2 tablets (400 mg total) by mouth 2 (two) times daily. Patient not taking: Reported on 03/31/2019 02/06/19   Woodroe Mode, MD  lurasidone (LATUDA) 20 MG TABS tablet Take 1 tablet (20 mg total) by mouth at bedtime. 03/03/19   Truett Mainland, DO  mycophenolate (CELLCEPT) 500 MG tablet Take 500 mg by mouth in the morning and at bedtime. 01/06/20 01/05/21  [provider]  nitrofurantoin,  macrocrystal-monohydrate, (MACROBID) 100 MG capsule Take 1 capsule (100 mg total) by mouth 2 (two) times daily. 10/17/20   Loura Halt A, NP  predniSONE (DELTASONE) 20 MG tablet Take 2 tablets (40 mg total) by mouth daily with breakfast. 09/29/18   Ghimire, Henreitta Leber, MD    Allergies    Patient has no known allergies.  Review of Systems   Review of Systems  All other systems reviewed and are negative.   Physical Exam Updated Vital Signs BP (!) 177/120 (BP Location: Left Arm)   Pulse 78   Temp 99.3 F (37.4 C) (Oral)   Resp 16   SpO2 99%   Physical Exam Vitals and nursing note reviewed.  Constitutional:      General: She is not in acute distress.    Appearance: Normal appearance. She is well-developed. She is not toxic-appearing.  HENT:     Head: Normocephalic and atraumatic.  Eyes:     General: Lids are normal.     Conjunctiva/sclera: Conjunctivae normal.     Pupils: Pupils are equal, round, and reactive to light.  Neck:     Thyroid: No thyroid mass.     Trachea: No tracheal deviation.  Cardiovascular:     Rate and Rhythm: Normal rate and regular rhythm.     Heart sounds: Normal heart sounds. No murmur heard.  No gallop.   Pulmonary:     Effort: Pulmonary effort is normal. No respiratory distress.     Breath sounds: No stridor. Examination of the right-upper field reveals decreased breath sounds. Examination of the left-upper field reveals decreased breath sounds. Decreased breath sounds present. No wheezing, rhonchi or rales.  Abdominal:     General: Bowel sounds are normal. There is distension.     Palpations: Abdomen is soft. There is fluid wave.     Tenderness: There is generalized abdominal tenderness. There is no rebound.  Musculoskeletal:        General: No tenderness. Normal range of motion.     Cervical back: Normal range of motion and neck supple.  Lymphadenopathy:     Comments: Bilateral le edema  Skin:    General: Skin is warm and dry.     Findings: No  abrasion or rash.  Neurological:     Mental Status: She is alert and oriented to person, place, and time.     GCS: GCS eye subscore is 4. GCS verbal subscore is 5. GCS motor subscore is 6.     Cranial Nerves: No cranial nerve deficit.     Sensory: No sensory deficit.  Psychiatric:  Speech: Speech normal.        Behavior: Behavior normal.     ED Results / Procedures / Treatments   Labs (all labs ordered are listed, but only abnormal results are displayed) Labs Reviewed  URINE CULTURE  CBC WITH DIFFERENTIAL/PLATELET  COMPREHENSIVE METABOLIC PANEL  LIPASE, BLOOD  URINALYSIS, ROUTINE W REFLEX MICROSCOPIC    EKG None  Radiology No results found.  Procedures Procedures (including critical care time)  Medications Ordered in ED Medications  0.9 %  sodium chloride infusion (has no administration in time range)    ED Course  I have reviewed the triage vital signs and the nursing notes.  Pertinent labs & imaging results that were available during my care of the patient were reviewed by me and considered in my medical decision making (see chart for details).    MDM Rules/Calculators/A&P                           Final Clinical Impression(s) / ED Diagnoses Final diagnoses:  None  Patient's labs reviewed and her renal function appears to be somewhat stable compared to prior.  Her anemia also is unchanged from yesterday's result.  Covid test was negative.  Does have evidence of UTI.  Abdominal CT results called to me by radiologist and concerning for anasarca as well as lymphadenopathy.  Unclear etiology.  Due to patient's edema, UTI and possible lupus exacerbation will admit patient to the hospital  Rx / DC Orders ED Discharge Orders    None       Lacretia Leigh, MD 11/07/20 1519

## 2020-11-08 ENCOUNTER — Inpatient Hospital Stay (HOSPITAL_COMMUNITY): Payer: Medicaid Other

## 2020-11-08 DIAGNOSIS — I313 Pericardial effusion (noninflammatory): Secondary | ICD-10-CM

## 2020-11-08 LAB — URINALYSIS, ROUTINE W REFLEX MICROSCOPIC
Bilirubin Urine: NEGATIVE
Glucose, UA: NEGATIVE mg/dL
Ketones, ur: NEGATIVE mg/dL
Nitrite: NEGATIVE
Protein, ur: >=300 mg/dL — AB
Specific Gravity, Urine: 1.015 (ref 1.005–1.030)
pH: 5 (ref 5.0–8.0)

## 2020-11-08 LAB — RAPID URINE DRUG SCREEN, HOSP PERFORMED
Amphetamines: NOT DETECTED
Barbiturates: NOT DETECTED
Benzodiazepines: NOT DETECTED
Cocaine: NOT DETECTED
Opiates: NOT DETECTED
Tetrahydrocannabinol: POSITIVE — AB

## 2020-11-08 LAB — ALBUMIN: Albumin: 2.3 g/dL — ABNORMAL LOW (ref 3.5–5.0)

## 2020-11-08 LAB — CBC
HCT: 20.8 % — ABNORMAL LOW (ref 36.0–46.0)
Hemoglobin: 6.9 g/dL — CL (ref 12.0–15.0)
MCH: 28.6 pg (ref 26.0–34.0)
MCHC: 33.2 g/dL (ref 30.0–36.0)
MCV: 86.3 fL (ref 80.0–100.0)
Platelets: 155 10*3/uL (ref 150–400)
RBC: 2.41 MIL/uL — ABNORMAL LOW (ref 3.87–5.11)
RDW: 15.3 % (ref 11.5–15.5)
WBC: 5.7 10*3/uL (ref 4.0–10.5)
nRBC: 0 % (ref 0.0–0.2)

## 2020-11-08 LAB — ECHOCARDIOGRAM COMPLETE
AR max vel: 3.09 cm2
AV Area VTI: 3.44 cm2
AV Area mean vel: 3.18 cm2
AV Mean grad: 7 mmHg
AV Peak grad: 14.9 mmHg
Ao pk vel: 1.93 m/s
Area-P 1/2: 3.37 cm2
Calc EF: 63.2 %
MV M vel: 5.33 m/s
MV Peak grad: 113.6 mmHg
Radius: 0.6 cm
S' Lateral: 2.8 cm
Single Plane A2C EF: 69.4 %
Single Plane A4C EF: 62.9 %

## 2020-11-08 LAB — PROTEIN / CREATININE RATIO, URINE
Creatinine, Urine: 150.26 mg/dL
Protein Creatinine Ratio: 7.46 mg/mg{Cre} — ABNORMAL HIGH (ref 0.00–0.15)
Total Protein, Urine: 1121 mg/dL

## 2020-11-08 LAB — BASIC METABOLIC PANEL
Anion gap: 9 (ref 5–15)
BUN: 46 mg/dL — ABNORMAL HIGH (ref 6–20)
CO2: 16 mmol/L — ABNORMAL LOW (ref 22–32)
Calcium: 6.9 mg/dL — ABNORMAL LOW (ref 8.9–10.3)
Chloride: 108 mmol/L (ref 98–111)
Creatinine, Ser: 2.26 mg/dL — ABNORMAL HIGH (ref 0.44–1.00)
GFR, Estimated: 28 mL/min — ABNORMAL LOW (ref >60)
Glucose, Bld: 103 mg/dL — ABNORMAL HIGH (ref 70–99)
Potassium: 4.6 mmol/L (ref 3.5–5.1)
Sodium: 133 mmol/L — ABNORMAL LOW (ref 135–145)

## 2020-11-08 LAB — LDL CHOLESTEROL, DIRECT: Direct LDL: 109.2 mg/dL — ABNORMAL HIGH (ref 0–99)

## 2020-11-08 LAB — PREPARE RBC (CROSSMATCH)

## 2020-11-08 MED ORDER — SODIUM BICARBONATE 650 MG PO TABS
650.0000 mg | ORAL_TABLET | Freq: Two times a day (BID) | ORAL | Status: DC
  Administered 2020-11-08 – 2020-11-10 (×4): 650 mg via ORAL
  Filled 2020-11-08 (×5): qty 1

## 2020-11-08 MED ORDER — LABETALOL HCL 100 MG PO TABS
400.0000 mg | ORAL_TABLET | Freq: Two times a day (BID) | ORAL | Status: DC
  Administered 2020-11-08 – 2020-11-13 (×11): 400 mg via ORAL
  Filled 2020-11-08 (×11): qty 4

## 2020-11-08 MED ORDER — AMLODIPINE BESYLATE 10 MG PO TABS
10.0000 mg | ORAL_TABLET | Freq: Every day | ORAL | Status: DC
  Administered 2020-11-08 – 2020-11-13 (×6): 10 mg via ORAL
  Filled 2020-11-08 (×6): qty 1

## 2020-11-08 NOTE — ED Notes (Signed)
Report called to Tibbie

## 2020-11-08 NOTE — Progress Notes (Signed)
PROGRESS NOTE    Cathy [name]  JJK:[id number] DOB: 11/25/1981 DOA: 11/07/2020 PCP: Patient, No Pcp Per    Chief Complaint  Patient presents with  . Dysuria  . Leg Swelling    bilateral  . Cough    Brief Narrative:  This is a 39 year old female with past medical history of bipolar depression, SLE, PID, renal hypertension, substance abuse who presented to the ED with 3 weeks of multiple nonspecific complaints most notably abdominal discomfort and distention.  Associated with lower extremity and abdominal edema.  Has a mild cough with some chest tightness associated with coughing and DOE.  Also some wheezing.  No fever or chills.  She was recently seen in the urgent care on 11/17 at which time she had similar complaints and found to have a UTI, hypertensive urgency and was referred to primary care and rheumatology.  She has not been seen by either.  States she ran out of her amlodipine 2 weeks ago and is currently not taking any medications at all  Subjective:  She is talking on the phone with her dad She complains of generalized edema Report having diarrhea every time she eat for the last 1 to 28-month No fever  Assessment & Plan:   Principal Problem:   Anasarca Active Problems:   Lupus ([place])   Acute renal failure (ARF) ([place])   Anemia   Aortic atherosclerosis ([facility])   Abdominal pain   Diarrhea   AKI with generalized edema, hypoalbumin anemia, proteinuria with history of uncontrolled lupus -Urine culture greater than 100,000 Klebsiella pneumoniae, currently on Rocephin -Nephrology consulted, currently on IV Lasix 60 mg twice a day, getting IV albumin infusion,defer renal biopsy and management of lupus nephritis to nephrology -Nephrology input appreciated  Metabolic acidosis, bicarb supplement  Hyponatremia, likely from volume overload, on IV Lasix   Acute on chronic normocytic anemia -Currently does not appear to have gross blood loss -Less likely hemolysis due to  normal total bilirubin -Suspect due to hemodilution from  Edema, getting 1 units PRBC -Check Fobt  Anasarca Marked gastric fold thickening may be due to hyperproteinemia or severe anasarca. Correlate with symptoms of gastritis and consider endoscopic assessment for further evaluation. Haustral thickening of the colon also may be related to anasarca, correlate with any symptoms of colitis. Would also consider correlation with lactate though again findings are diffuse, generalized in seen in the setting of anasarca. Currently getting IV albumin and IV Lasix  Hypertension -Noncompliance with medication -Continue Norvasc and labetalol, IV Lasix    Lupus Per chart review was on hydroxychloroquine and prednisone Does not appear to be compliance with medication Does not appear to follow with rheumatology regularly  Lymphadenopathy on CT abdomen pelvis  Aortic atherosclerosis, hyperlipidemia, will discuss statin with patient, however compliance is an issue  She reported loss insurance, will consult case manager   DVT prophylaxis: heparin injection 5,000 Units Start: 11/07/20 1630   Code Status: Full Family Communication: Patient Disposition:   Status is: Inpatient  Dispo: The patient is from: Home              Anticipated d/c is to: Home              Anticipated d/c date is:               Patient currently not medically ready to discharge  Consultants:   Nephrology  Procedures:   PRBC transfusion  Antimicrobials:   Rocephin     Objective: Vitals:  11/08/20 0551 11/08/20 0630 11/08/20 0700 11/08/20 0825  BP: (!) 173/117 (!) 179/127 (!) 166/100 (!) 174/119  Pulse: 83 83 85 81  Resp: 14 19 18 20   Temp:    98.4 F (36.9 C)  TempSrc:    Oral  SpO2: 100% 100% 100% 100%    Intake/Output Summary (Last 24 hours) at 11/08/2020 0857 Last data filed at 11/08/2020 0150 Gross per 24 hour  Intake 1815 ml  Output --  Net 1815 ml   There were no vitals filed for  this visit.  Examination:  General exam: Generalized edema, NAD Respiratory system: Diminished at bases, Respiratory effort normal. Cardiovascular system: S1 & S2 heard, RRR. No JVD, no murmur, No pedal edema. Gastrointestinal system: Abdomen is nondistended, soft and nontender. Normal bowel sounds heard. Central nervous system: Alert and oriented. No focal neurological deficits. Extremities: Symmetric 5 x 5 power. Skin: No rashes, lesions or ulcers Psychiatry: Judgement and insight appear normal. Mood & affect appropriate.     Data Reviewed: I have personally reviewed following labs and imaging studies  CBC: Recent Labs  Lab 11/06/20 1759 11/07/20 1104 11/07/20 2245 11/08/20 0603  WBC 7.7 7.2  --  5.7  NEUTROABS  --  5.3  --   --   HGB 7.5* 7.3* 6.2* 6.9*  HCT 22.9* 22.6* 18.6* 20.8*  MCV 87.7 85.6  --  86.3  PLT 147* 171  --  161    Basic Metabolic Panel: Recent Labs  Lab 11/06/20 1759 11/07/20 1104 11/08/20 0603  NA 134* 133* 133*  K 5.2* 5.1 4.6  CL 106 107 108  CO2 20* 17* 16*  GLUCOSE 102* 99 103*  BUN 54* 53* 46*  CREATININE 2.61* 2.50* 2.26*  CALCIUM 7.1* 6.9* 6.9*    GFR: Estimated Creatinine Clearance: 33.1 mL/min (A) (by C-G formula based on SCr of 2.26 mg/dL (H)).  Liver Function Tests: Recent Labs  Lab 11/06/20 1759 11/07/20 1104 11/08/20 0603  AST 22 22  --   ALT 14 14  --   ALKPHOS 84 89  --   BILITOT 0.3 0.4  --   PROT 5.0* 4.8*  --   ALBUMIN 1.6* 1.5* 2.3*    CBG: No results for input(s): GLUCAP in the last 168 hours.   Recent Results (from the past 240 hour(s))  Resp Panel by RT-PCR (Flu A&B, Covid) Nasopharyngeal Swab     Status: None   Collection Time: 11/07/20 11:06 AM   Specimen: Nasopharyngeal Swab; Nasopharyngeal(NP) swabs in vial transport medium  Result Value Ref Range Status   SARS Coronavirus 2 by RT PCR NEGATIVE NEGATIVE Final    Comment: (NOTE) SARS-CoV-2 target nucleic acids are NOT DETECTED.  The SARS-CoV-2  RNA is generally detectable in upper respiratory specimens during the acute phase of infection. The lowest concentration of SARS-CoV-2 viral copies this assay can detect is 138 copies/mL. A negative result does not preclude SARS-Cov-2 infection and should not be used as the sole basis for treatment or other patient management decisions. A negative result may occur with  improper specimen collection/handling, submission of specimen other than nasopharyngeal swab, presence of viral mutation(s) within the areas targeted by this assay, and inadequate number of viral copies(<138 copies/mL). A negative result must be combined with clinical observations, patient history, and epidemiological information. The expected result is Negative.  Fact Sheet for Patients:  EntrepreneurPulse.com.au  Fact Sheet for Healthcare Providers:  IncredibleEmployment.be  This test is no t yet approved or cleared by the [place]  FDA and  has been authorized for detection and/or diagnosis of SARS-CoV-2 by FDA under an Emergency Use Authorization (EUA). This EUA will remain  in effect (meaning this test can be used) for the duration of the COVID-19 declaration under Section 564(b)(1) of the Act, 21 U.S.C.section 360bbb-3(b)(1), unless the authorization is terminated  or revoked sooner.       Influenza A by PCR NEGATIVE NEGATIVE Final   Influenza B by PCR NEGATIVE NEGATIVE Final    Comment: (NOTE) The Xpert Xpress SARS-CoV-2/FLU/RSV plus assay is intended as an aid in the diagnosis of influenza from Nasopharyngeal swab specimens and should not be used as a sole basis for treatment. Nasal washings and aspirates are unacceptable for Xpert Xpress SARS-CoV-2/FLU/RSV testing.  Fact Sheet for Patients: EntrepreneurPulse.com.au  Fact Sheet for Healthcare Providers: IncredibleEmployment.be  This test is not yet approved or cleared by the  [place] FDA and has been authorized for detection and/or diagnosis of SARS-CoV-2 by FDA under an Emergency Use Authorization (EUA). This EUA will remain in effect (meaning this test can be used) for the duration of the COVID-19 declaration under Section 564(b)(1) of the Act, 21 U.S.C. section 360bbb-3(b)(1), unless the authorization is terminated or revoked.  Performed at [facility], [place] [address], [place], [place] [address]          Radiology Studies: CT ABDOMEN PELVIS WO CONTRAST  Addendum Date: 11/07/2020   ADDENDUM REPORT: 11/07/2020 15:12 ADDENDUM: These results were called by telephone at the time of interpretation on 11/07/2020 at 3:12 pm to provider Lacretia Leigh , who verbally acknowledged these results. Electronically Signed   By: Zetta Bills M.D.   On: 11/07/2020 15:12   Result Date: 11/07/2020 CLINICAL DATA:  Abdominal distension, throat burning and cough EXAM: CT ABDOMEN AND PELVIS WITHOUT CONTRAST TECHNIQUE: Multidetector CT imaging of the abdomen and pelvis was performed following the standard protocol without IV contrast. COMPARISON:  CT angiography of the chest of February 01, 2019 FINDINGS: Lower chest: Cardiomegaly. Heart is incompletely imaged. Volume loss in the LEFT lung base. Moderate pericardial effusion with low-attenuation of cardiac blood pool compatible with anemia. Hepatobiliary: Liver with normal contour and size. No gallbladder distension. Pancreas: Normal contour of the pancreas. Generalized stranding throughout the abdomen not concentrated in the area of the pancreas in seen in the setting of anasarca is of uncertain significance. Spleen: Spleen normal size.  Normal contour of the spleen. Adrenals/Urinary Tract: Adrenal glands normal. Bilateral perinephric stranding. No signs of hydronephrosis. No radiopaque calculi. Urinary bladder under distended limiting assessment. Stomach/Bowel: Fold thickening, a very pronounced fold thickening in  the stomach. Generalized edema in the small bowel mesentery. Haustral thickening throughout the entire colon. Bowel edema in the small bowel without focal abnormality. No sign of bowel obstruction. Dilute contrast extends into the colon, through the small bowel. Appendix is normal Vascular/Lymphatic: No aneurysmal dilation of the abdominal aorta. Smooth contour of the IVC. 17 mm LEFT para-aortic lymph node. No additional adenopathy visualized in the upper abdomen though there is extensive edema which limits assessment. Anasarca blurs boundaries between fat, solid and hollow viscera in the abdomen and musculoskeletal structures in the body wall. Enlarged lymph nodes about the bilateral inguinal regions and in the pelvis (image 68, series 2) 1.4 cm LEFT external iliac lymph node and generalized nodal enlargement with similar size in the bilateral external iliac chains and along is the RIGHT and LEFT groin. Reproductive: Bilateral tubal ligation clips. No adnexal masses. Limited assessment on noncontrast  imaging. Other: Small volume ascites in the pelvis.  No free air. Musculoskeletal: No acute bone finding. No destructive bone process. IMPRESSION: 1. Anasarca with signs of anemia. 2. Marked gastric fold thickening may be due to hyperproteinemia or severe anasarca. Correlate with symptoms of gastritis and consider endoscopic assessment for further evaluation. 3. Haustral thickening of the colon also may be related to anasarca, correlate with any symptoms of colitis. Would also consider correlation with lactate though again findings are diffuse, generalized in seen in the setting of anasarca. 4. Generalized nodal enlargement seen mainly in the pelvis findings are nonspecific, raising the question of lymphoproliferative disorder and not well evaluated given lack of contrast and diffuse marked anasarca. Would also correlate with processes such as HIV infection which could cause generalized nodal enlargement. 5. Bilateral  perinephric stranding. Seen in the setting of anasarca without signs of hydronephrosis. 6. Lingular consolidative changes partially visualized may simply be due to volume loss though are not well assessed. Consider follow-up to ensure resolution. Correlate with any respiratory symptoms. 7. Aortic atherosclerosis. Aortic Atherosclerosis (ICD10-I70.0). Electronically Signed: By: Zetta Bills M.D. On: 11/07/2020 15:04   DG Chest Port 1 View  Result Date: 11/07/2020 CLINICAL DATA:  Shortness of breath, cough EXAM: PORTABLE CHEST 1 VIEW COMPARISON:  02/01/2019 FINDINGS: Cardiomegaly. No confluent opacities or effusions. No edema. No acute bony abnormality. IMPRESSION: Cardiomegaly.  No active disease. Electronically Signed   By: Rolm Baptise M.D.   On: 11/07/2020 11:53        Scheduled Meds: . sodium chloride   Intravenous Once  . amLODipine  10 mg Oral Daily  . furosemide  60 mg Intravenous Q12H  . heparin  5,000 Units Subcutaneous Q8H  . influenza vac split quadrivalent PF  0.5 mL Intramuscular Tomorrow-1000  . labetalol  400 mg Oral BID   Continuous Infusions: . sodium chloride 20 mL/hr at 11/07/20 1545  . albumin human Stopped (11/08/20 0150)  . cefTRIAXone (ROCEPHIN)  IV Stopped (11/07/20 1833)     LOS: 1 day   Time spent: 48mins Greater than 50% of this time was spent in counseling, explanation of diagnosis, planning of further management, and coordination of care.  I have personally reviewed and interpreted on  11/08/2020 daily labs, I reviewed all nursing notes, pharmacy notes, consultant notes,  vitals, pertinent old records  I have discussed plan of care as described above with RN , patient  on 11/08/2020  Voice Recognition /Dragon dictation system was used to create this note, attempts have been made to correct errors. Please contact the author with questions and/or clarifications.   Florencia Reasons, MD PhD FACP [facility]  Available via Epic secure chat 7am-7pm for  nonurgent issues Please page for urgent issues To page the attending provider between 7A-7P or the covering provider during after hours 7P-7A, please log into the web site [email or web address] and access using universal [place] password for that web site. If you do not have the password, please call the hospital operator.    11/08/2020, 8:57 AM

## 2020-11-08 NOTE — Progress Notes (Signed)
*  PRELIMINARY RESULTS* Echocardiogram 2D Echocardiogram has been performed.  Cathy [name] 11/08/2020, 3:10 PM

## 2020-11-08 NOTE — Progress Notes (Signed)
[facility] NEPHROLOGY PROGRESS NOTE  Assessment/ Plan: Pt is a 39 y.o. yo female  with history of hypertension, bipolar disorder, anxiety, diagnosed with lupus nephritis in 2019 when she was pregnant with AKI, nonadherence with outpatient follow-up, presented with generalized body pain, anasarca seen as a consultation for the evaluation of worsening renal failure and fluid overload.  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  She has biopsy-proven class III lupus nephritis in 2019.  She lost follow-up and did not take her medication.   UA with UTI, proteinuria.  Agree with antibiotics, follow-up culture results Quantify proteinuria, CT scan of abdomen pelvis ruled out obstruction.  Perinephric stranding probably because of UTI versus fluid overload. Lupus serologies pending, she may benefit from steroid and cellcept, now on abx for UTI. She has bilateral tubal ligation for family-planning.   Continue lasix IV, albumin.  Serum creatinine level trending down to 2.2 from 2.5. Given nonadherence with treatment and follow-ups, I do not see the benefit of doing another kidney biopsy.  She probably has a lot of chronicity now. strict ins and out Avoid hypotension, NSAIDs, IV contrast.  #History of lupus nephritis class III: She had kidney biopsy in 08/2018 for nephrotic syndrome and AKI which showed focal lupus nephritis mildly active and minimally sclerosing phase.  Management as above.  She was not compliant with outpatient treatment and follow-up.  She was never seen by rheumatologist.  #Hypertension/volume: Starting diuretics.  Monitor blood pressure.    Started amlodipine.  #Anemia, probably due to lupus: Iron saturation 18% with high ferritin.  She is receiving blood transfusion.  No sign of bleeding.  #Abdomen pain/anasarca/nephrotic syndrome: Due to fluid overload.  Getting echo  per primary team.  Subjective: Seen and examined at bedside.  She is just came to the floor from the ER.  Urine output is not recorded.  Reports generalized body pain.  Increased urine output since this morning.  No nausea vomiting chest pain or shortness of breath. Objective Vital signs in last 24 hours: Vitals:   11/08/20 0630 11/08/20 0700 11/08/20 0825 11/08/20 1108  BP: (!) 179/127 (!) 166/100 (!) 174/119 (!) 163/109  Pulse: 83 85 81 82  Resp: 19 18 20 20   Temp:   98.4 F (36.9 C) 98.6 F (37 C)  TempSrc:   Oral Oral  SpO2: 100% 100% 100% 99%   Weight change:   Intake/Output Summary (Last 24 hours) at 11/08/2020 1156 Last data filed at 11/08/2020 1052 Gross per 24 hour  Intake 2055 ml  Output --  Net 2055 ml       Labs: Basic Metabolic Panel: Recent Labs  Lab 11/06/20 1759 11/07/20 1104 11/08/20 0603  NA 134* 133* 133*  K 5.2* 5.1 4.6  CL 106 107 108  CO2 20* 17* 16*  GLUCOSE 102* 99 103*  BUN 54* 53* 46*  CREATININE 2.61* 2.50* 2.26*  CALCIUM 7.1* 6.9* 6.9*   Liver Function Tests: Recent Labs  Lab 11/06/20 1759 11/07/20 1104 11/08/20 0603  AST 22 22  --   ALT 14 14  --   ALKPHOS 84 89  --   BILITOT 0.3 0.4  --   PROT 5.0* 4.8*  --   ALBUMIN 1.6* 1.5* 2.3*   Recent Labs  Lab 11/06/20 1759 11/07/20 1104  LIPASE 54* 51   No results for input(s): AMMONIA in the last 168 hours. CBC: Recent Labs  Lab 11/06/20  1759 11/07/20 1104 11/07/20 2245 11/08/20 0603  WBC 7.7 7.2  --  5.7  NEUTROABS  --  5.3  --   --   HGB 7.5* 7.3* 6.2* 6.9*  HCT 22.9* 22.6* 18.6* 20.8*  MCV 87.7 85.6  --  86.3  PLT 147* 171  --  155   Cardiac Enzymes: No results for input(s): CKTOTAL, CKMB, CKMBINDEX, TROPONINI in the last 168 hours. CBG: No results for input(s): GLUCAP in the last 168 hours.  Iron Studies:  Recent Labs    11/07/20 1524  IRON 24*  TIBC 135*  FERRITIN 499*   Studies/Results: CT ABDOMEN PELVIS WO CONTRAST  Addendum Date: 11/07/2020    ADDENDUM REPORT: 11/07/2020 15:12 ADDENDUM: These results were called by telephone at the time of interpretation on 11/07/2020 at 3:12 pm to provider Lacretia Leigh , who verbally acknowledged these results. Electronically Signed   By: Zetta Bills M.D.   On: 11/07/2020 15:12   Result Date: 11/07/2020 CLINICAL DATA:  Abdominal distension, throat burning and cough EXAM: CT ABDOMEN AND PELVIS WITHOUT CONTRAST TECHNIQUE: Multidetector CT imaging of the abdomen and pelvis was performed following the standard protocol without IV contrast. COMPARISON:  CT angiography of the chest of February 01, 2019 FINDINGS: Lower chest: Cardiomegaly. Heart is incompletely imaged. Volume loss in the LEFT lung base. Moderate pericardial effusion with low-attenuation of cardiac blood pool compatible with anemia. Hepatobiliary: Liver with normal contour and size. No gallbladder distension. Pancreas: Normal contour of the pancreas. Generalized stranding throughout the abdomen not concentrated in the area of the pancreas in seen in the setting of anasarca is of uncertain significance. Spleen: Spleen normal size.  Normal contour of the spleen. Adrenals/Urinary Tract: Adrenal glands normal. Bilateral perinephric stranding. No signs of hydronephrosis. No radiopaque calculi. Urinary bladder under distended limiting assessment. Stomach/Bowel: Fold thickening, a very pronounced fold thickening in the stomach. Generalized edema in the small bowel mesentery. Haustral thickening throughout the entire colon. Bowel edema in the small bowel without focal abnormality. No sign of bowel obstruction. Dilute contrast extends into the colon, through the small bowel. Appendix is normal Vascular/Lymphatic: No aneurysmal dilation of the abdominal aorta. Smooth contour of the IVC. 17 mm LEFT para-aortic lymph node. No additional adenopathy visualized in the upper abdomen though there is extensive edema which limits assessment. Anasarca blurs boundaries between  fat, solid and hollow viscera in the abdomen and musculoskeletal structures in the body wall. Enlarged lymph nodes about the bilateral inguinal regions and in the pelvis (image 68, series 2) 1.4 cm LEFT external iliac lymph node and generalized nodal enlargement with similar size in the bilateral external iliac chains and along is the RIGHT and LEFT groin. Reproductive: Bilateral tubal ligation clips. No adnexal masses. Limited assessment on noncontrast imaging. Other: Small volume ascites in the pelvis.  No free air. Musculoskeletal: No acute bone finding. No destructive bone process. IMPRESSION: 1. Anasarca with signs of anemia. 2. Marked gastric fold thickening may be due to hyperproteinemia or severe anasarca. Correlate with symptoms of gastritis and consider endoscopic assessment for further evaluation. 3. Haustral thickening of the colon also may be related to anasarca, correlate with any symptoms of colitis. Would also consider correlation with lactate though again findings are diffuse, generalized in seen in the setting of anasarca. 4. Generalized nodal enlargement seen mainly in the pelvis findings are nonspecific, raising the question of lymphoproliferative disorder and not well evaluated given lack of contrast and diffuse marked anasarca. Would also correlate with processes such as  HIV infection which could cause generalized nodal enlargement. 5. Bilateral perinephric stranding. Seen in the setting of anasarca without signs of hydronephrosis. 6. Lingular consolidative changes partially visualized may simply be due to volume loss though are not well assessed. Consider follow-up to ensure resolution. Correlate with any respiratory symptoms. 7. Aortic atherosclerosis. Aortic Atherosclerosis (ICD10-I70.0). Electronically Signed: By: Zetta Bills M.D. On: 11/07/2020 15:04   DG Chest Port 1 View  Result Date: 11/07/2020 CLINICAL DATA:  Shortness of breath, cough EXAM: PORTABLE CHEST 1 VIEW COMPARISON:   02/01/2019 FINDINGS: Cardiomegaly. No confluent opacities or effusions. No edema. No acute bony abnormality. IMPRESSION: Cardiomegaly.  No active disease. Electronically Signed   By: Rolm Baptise M.D.   On: 11/07/2020 11:53    Medications: Infusions: . sodium chloride 20 mL/hr at 11/07/20 1545  . albumin human 50 g (11/08/20 1043)  . cefTRIAXone (ROCEPHIN)  IV Stopped (11/07/20 1833)    Scheduled Medications: . sodium chloride   Intravenous Once  . amLODipine  10 mg Oral Daily  . furosemide  60 mg Intravenous Q12H  . heparin  5,000 Units Subcutaneous Q8H  . influenza vac split quadrivalent PF  0.5 mL Intramuscular Tomorrow-1000  . labetalol  400 mg Oral BID    have reviewed scheduled and prn medications.  Physical Exam: General:NAD, comfortable Heart:RRR, s1s2 nl, no rubs Lungs:clear b/l, no crackle Abdomen:soft, Non-tender, non-distended Extremities: Lower extremity edema+ Neurology: Alert, awake, nonfocal, no asterixis  Aletheia Tangredi Prasad Nuno Brubacher 11/08/2020,11:56 AM  LOS: 1 day  Pager: [phone]

## 2020-11-09 DIAGNOSIS — N183 Chronic kidney disease, stage 3 unspecified: Secondary | ICD-10-CM

## 2020-11-09 LAB — CBC
HCT: 25.2 % — ABNORMAL LOW (ref 36.0–46.0)
Hemoglobin: 8.4 g/dL — ABNORMAL LOW (ref 12.0–15.0)
MCH: 29.1 pg (ref 26.0–34.0)
MCHC: 33.3 g/dL (ref 30.0–36.0)
MCV: 87.2 fL (ref 80.0–100.0)
Platelets: 150 10*3/uL (ref 150–400)
RBC: 2.89 MIL/uL — ABNORMAL LOW (ref 3.87–5.11)
RDW: 15 % (ref 11.5–15.5)
WBC: 5.1 10*3/uL (ref 4.0–10.5)
nRBC: 0 % (ref 0.0–0.2)

## 2020-11-09 LAB — TYPE AND SCREEN
ABO/RH(D): O POS
Antibody Screen: NEGATIVE
Donor AG Type: NEGATIVE
PT AG Type: NEGATIVE
Unit division: 0

## 2020-11-09 LAB — RENAL FUNCTION PANEL
Albumin: 2.2 g/dL — ABNORMAL LOW (ref 3.5–5.0)
Anion gap: 9 (ref 5–15)
BUN: 54 mg/dL — ABNORMAL HIGH (ref 6–20)
CO2: 20 mmol/L — ABNORMAL LOW (ref 22–32)
Calcium: 7 mg/dL — ABNORMAL LOW (ref 8.9–10.3)
Chloride: 108 mmol/L (ref 98–111)
Creatinine, Ser: 2.21 mg/dL — ABNORMAL HIGH (ref 0.44–1.00)
GFR, Estimated: 29 mL/min — ABNORMAL LOW (ref >60)
Glucose, Bld: 99 mg/dL (ref 70–99)
Phosphorus: 6.2 mg/dL — ABNORMAL HIGH (ref 2.5–4.6)
Potassium: 4.6 mmol/L (ref 3.5–5.1)
Sodium: 137 mmol/L (ref 135–145)

## 2020-11-09 LAB — BPAM RBC
Blood Product Expiration Date: 202201172359
ISSUE DATE / TIME: 202112091518
Unit Type and Rh: 5100

## 2020-11-09 LAB — MRSA PCR SCREENING: MRSA by PCR: NEGATIVE

## 2020-11-09 LAB — URINE CULTURE: Culture: >=100000 — AB

## 2020-11-09 LAB — ENA+DNA/DS+ANTICH+CENTRO+JO...
Anti JO-1: <0.2 AI (ref 0.0–0.9)
Centromere Ab Screen: >8 AI — ABNORMAL HIGH (ref 0.0–0.9)
Chromatin Ab SerPl-aCnc: >8 AI — ABNORMAL HIGH (ref 0.0–0.9)
ENA SM Ab Ser-aCnc: >8 AI — ABNORMAL HIGH (ref 0.0–0.9)
Ribonucleic Protein: 7.3 AI — ABNORMAL HIGH (ref 0.0–0.9)
SSA (Ro) (ENA) Antibody, IgG: >8 AI — ABNORMAL HIGH (ref 0.0–0.9)
SSB (La) (ENA) Antibody, IgG: >8 AI — ABNORMAL HIGH (ref 0.0–0.9)
Scleroderma (Scl-70) (ENA) Antibody, IgG: 0.5 AI (ref 0.0–0.9)
ds DNA Ab: 31 IU/mL — ABNORMAL HIGH (ref 0–9)

## 2020-11-09 LAB — ANA W/REFLEX IF POSITIVE: Anti Nuclear Antibody (ANA): POSITIVE — AB

## 2020-11-09 LAB — C3 COMPLEMENT: C3 Complement: 19 mg/dL — ABNORMAL LOW (ref 82–167)

## 2020-11-09 LAB — C4 COMPLEMENT: Complement C4, Body Fluid: <2 mg/dL — ABNORMAL LOW (ref 12–38)

## 2020-11-09 LAB — ANTI-DNA ANTIBODY, DOUBLE-STRANDED: ds DNA Ab: 38 IU/mL — ABNORMAL HIGH (ref 0–9)

## 2020-11-09 LAB — LACTIC ACID, PLASMA: Lactic Acid, Venous: 0.7 mmol/L (ref 0.5–1.9)

## 2020-11-09 MED ORDER — HYDROCORTISONE 1 % EX CREA
TOPICAL_CREAM | Freq: Two times a day (BID) | CUTANEOUS | Status: DC
  Administered 2020-11-09 – 2020-11-13 (×2): 1 via TOPICAL
  Filled 2020-11-09: qty 28

## 2020-11-09 NOTE — Progress Notes (Addendum)
[facility] NEPHROLOGY PROGRESS NOTE  Assessment/ Plan: Pt is a 39 y.o. yo female  with history of hypertension, bipolar disorder, anxiety, diagnosed with lupus nephritis in 2019 when she was pregnant with AKI, nonadherence with outpatient follow-up, presented with generalized body pain, anasarca seen as a consultation for the evaluation of worsening renal failure and fluid overload.  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  She has biopsy-proven class III lupus nephritis in 2019.  She lost follow-up and did not take her medication.   UA with UTI, proteinuria, 7.4 g.  CT scan of abdomen pelvis ruled out obstruction.  She probably has progression of her nephritis and a lot of chronic changes. Ideally, we need to do kidney biopsy to evaluate the lupus nephritis and chronicity. However, given UTI and current infection unable to do kidney biopsy. She is also noncompliant with outpatient follow-ups and treatments. For now, I will continue IV diuretics, albumin. She is nonoliguric and creatinine level stable at 2.21. Monitor renal function and a strict ins and out.  When euvolemic, the plan is to place her on ACE inhibitor or ARB gradually. If she takes her medications and follows regularly then we may consider doing kidney biopsy as outpatient. She has bilateral tubal ligation for family-planning.   Avoid hypotension, NSAIDs, IV contrast.  #History of lupus nephritis class III: She had kidney biopsy in 08/2018 for nephrotic syndrome and AKI which showed focal lupus nephritis mildly active and minimally sclerosing phase.  Management as above.  She was not compliant with outpatient treatment and follow-up.  She was never seen by rheumatologist. Noted complements level are low, ANA and dsDNA antibody pending. Also with pericardial effusion. May consider rheumatology  consult/curbside.  #Hypertension/volume: Diuretics as above and started amlodipine, labetalol. Blood pressure better.  #Anemia, probably due to lupus: Iron saturation 18% with high ferritin. Received blood transfusion.  No sign of bleeding.  #Abdomen pain/anasarca/nephrotic syndrome: Echo with pericardial effusion seen by cardiology. Currently on diuretics.   Subjective: Seen and examined at bedside. Feeling better. She has urine output around 600 cc. Denies nausea vomiting however has some shortness of breath. No chest pain. Objective Vital signs in last 24 hours: Vitals:   11/08/20 2051 11/09/20 0546 11/09/20 1027 11/09/20 1341  BP: (!) 153/102 (!) 133/105 (!) 141/101 130/85  Pulse: 77 72 74 68  Resp: 16 14  19   Temp: 99.2 F (37.3 C) 98.3 F (36.8 C)  97.9 F (36.6 C)  TempSrc: Oral Oral  Oral  SpO2: 99% 98%  98%  Weight:  71.8 kg    Height:       Weight change:   Intake/Output Summary (Last 24 hours) at 11/09/2020 1434 Last data filed at 11/09/2020 0600 Gross per 24 hour  Intake 1582.33 ml  Output 600 ml  Net 982.33 ml       Labs: Basic Metabolic Panel: Recent Labs  Lab 11/07/20 1104 11/08/20 0603 11/09/20 0707  NA 133* 133* 137  K 5.1 4.6 4.6  CL 107 108 108  CO2 17* 16* 20*  GLUCOSE 99 103* 99  BUN 53* 46* 54*  CREATININE 2.50* 2.26* 2.21*  CALCIUM 6.9* 6.9* 7.0*  PHOS  --   --  6.2*   Liver Function Tests: Recent Labs  Lab 11/06/20 1759 11/07/20 1104 11/08/20 0603 11/09/20 0707  AST 22 22  --   --   ALT 14 14  --   --  ALKPHOS 84 89  --   --   BILITOT 0.3 0.4  --   --   PROT 5.0* 4.8*  --   --   ALBUMIN 1.6* 1.5* 2.3* 2.2*   Recent Labs  Lab 11/06/20 1759 11/07/20 1104  LIPASE 54* 51   No results for input(s): AMMONIA in the last 168 hours. CBC: Recent Labs  Lab 11/06/20 1759 11/07/20 1104 11/07/20 2245 11/08/20 0603 11/09/20 0707  WBC 7.7 7.2  --  5.7 5.1  NEUTROABS  --  5.3  --   --   --   HGB 7.5* 7.3* 6.2* 6.9* 8.4*   HCT 22.9* 22.6* 18.6* 20.8* 25.2*  MCV 87.7 85.6  --  86.3 87.2  PLT 147* 171  --  155 150   Cardiac Enzymes: No results for input(s): CKTOTAL, CKMB, CKMBINDEX, TROPONINI in the last 168 hours. CBG: No results for input(s): GLUCAP in the last 168 hours.  Iron Studies:  Recent Labs    11/07/20 1524  IRON 24*  TIBC 135*  FERRITIN 499*   Studies/Results: ECHOCARDIOGRAM COMPLETE  Result Date: 11/08/2020    ECHOCARDIOGRAM REPORT   Patient Name:   NYELLA ECKELS Date of Exam: 11/08/2020 Medical Rec #:  [id number]        Height:       67.0 in Accession #:    [id number]       Weight:       143.0 lb Date of Birth:  Mar 28, 1981       BSA:          1.754 m Patient Age:    40 years         BP:           163/109 mmHg Patient Gender: F                HR:           75 bpm. Exam Location:  Inpatient Procedure: 2D Echo, 3D Echo, Cardiac Doppler and Color Doppler REPORT CONTAINS CRITICAL RESULT Indications:    Anasarca  History:        Patient has prior history of Echocardiogram examinations, most                 recent 02/02/2019. Risk Factors:Hypertension. Substance abuse.                 Pulmonary edema. Renal failure. Lupus.  Sonographer:    Roseanna Rainbow RDCS Referring Phys: [id number] [place]  1. Left ventricular ejection fraction, by estimation, is 60 to 65%. The left ventricle has normal function. The left ventricle has no regional wall motion abnormalities. There is severe left ventricular hypertrophy.  2. Right ventricular systolic function is normal. The right ventricular size is normal. Mildly increased right ventricular wall thickness. There is normal pulmonary artery systolic pressure.  3. Large pericardial effusion surrounds heart Measures 17 mm in maximal dimension. Mitral inflow is without significant respiratory variation; there is mild RV indentation but no RV collapse But IVC is dilated Does not meet echo criteria for tamponade CLinical correlation indicated. . Large pericardial  effusion. The pericardial effusion is circumferential.  4. MR is eccentric, directed more posterior into LA. The mitral valve is abnormal. Mild mitral valve regurgitation.  5. The aortic valve is tricuspid. Aortic valve regurgitation is trivial. Mild aortic valve sclerosis is present, with no evidence of aortic valve stenosis. FINDINGS  Left Ventricle: Left ventricular ejection fraction, by estimation, is 60 to  65%. The left ventricle has normal function. The left ventricle has no regional wall motion abnormalities. The left ventricular internal cavity size was normal in size. There is  severe left ventricular hypertrophy. Right Ventricle: The right ventricular size is normal. Mildly increased right ventricular wall thickness. Right ventricular systolic function is normal. There is normal pulmonary artery systolic pressure. The tricuspid regurgitant velocity is 2.39 m/s, and with an assumed right atrial pressure of 3 mmHg, the estimated right ventricular systolic pressure is 89.2 mmHg. Left Atrium: Left atrial size was normal in size. Right Atrium: Right atrial size was normal in size. Pericardium: Large pericardial effusion surrounds heart Measures 17 mm in maximal dimension. Mitral inflow is without significant respiratory variation; there is mild RV indentation but no RV collapse But IVC is dilated Does not meet echo criteria for tamponade CLinical correlation indicated. A large pericardial effusion is present. The pericardial effusion is circumferential. Mitral Valve: MR is eccentric, directed more posterior into LA. The mitral valve is abnormal. There is mild thickening of the mitral valve leaflet(s). Mild mitral valve regurgitation. Tricuspid Valve: The tricuspid valve is normal in structure. Tricuspid valve regurgitation is not demonstrated. Aortic Valve: The aortic valve is tricuspid. Aortic valve regurgitation is trivial. Mild aortic valve sclerosis is present, with no evidence of aortic valve stenosis.  Aortic valve mean gradient measures 7.0 mmHg. Aortic valve peak gradient measures 14.9 mmHg. Aortic valve area, by VTI measures 3.44 cm. Pulmonic Valve: The pulmonic valve was grossly normal. Pulmonic valve regurgitation is trivial. Aorta: The aortic root is normal in size and structure. IAS/Shunts: The interatrial septum was not assessed. Additional Comments: Mild ascites is present.  LEFT VENTRICLE PLAX 2D LVIDd:         4.20 cm      Diastology LVIDs:         2.80 cm      LV e' medial:    13.20 cm/s LV PW:         2.10 cm      LV E/e' medial:  9.9 LV IVS:        1.50 cm      LV e' lateral:   12.10 cm/s LVOT diam:     2.10 cm      LV E/e' lateral: 10.8 LV SV:         125 LV SV Index:   71 LVOT Area:     3.46 cm  LV Volumes (MOD) LV vol d, MOD A2C: 105.0 ml LV vol d, MOD A4C: 118.0 ml LV vol s, MOD A2C: 32.1 ml LV vol s, MOD A4C: 43.8 ml LV SV MOD A2C:     72.9 ml LV SV MOD A4C:     118.0 ml LV SV MOD BP:      73.2 ml RIGHT VENTRICLE             IVC RV S prime:     17.50 cm/s  IVC diam: 2.70 cm TAPSE (M-mode): 3.7 cm LEFT ATRIUM             Index       RIGHT ATRIUM           Index LA diam:        4.00 cm 2.28 cm/m  RA Area:     21.00 cm LA Vol (A2C):   49.3 ml 28.11 ml/m RA Volume:   63.60 ml  36.27 ml/m LA Vol (A4C):   56.0 ml 31.94 ml/m LA Biplane Vol: 52.5  ml 29.94 ml/m  AORTIC VALVE                    PULMONIC VALVE AV Area (Vmax):    3.09 cm     PR End Diast Vel: 2.28 msec AV Area (Vmean):   3.18 cm AV Area (VTI):     3.44 cm AV Vmax:           193.00 cm/s AV Vmean:          121.000 cm/s AV VTI:            0.362 m AV Peak Grad:      14.9 mmHg AV Mean Grad:      7.0 mmHg LVOT Vmax:         172.00 cm/s LVOT Vmean:        111.000 cm/s LVOT VTI:          0.360 m LVOT/AV VTI ratio: 0.99  AORTA Ao Root diam: 3.20 cm MITRAL VALVE                 TRICUSPID VALVE MV Area (PHT): 3.37 cm      TR Peak grad:   22.8 mmHg MV Decel Time: 225 msec      TR Vmax:        239.00 cm/s MR Peak grad:    113.6 mmHg MR Mean  grad:    67.0 mmHg   SHUNTS MR Vmax:         533.00 cm/s Systemic VTI:  0.36 m MR Vmean:        381.0 cm/s  Systemic Diam: 2.10 cm MR PISA:         2.26 cm MR PISA Eff ROA: 16 mm MR PISA Radius:  0.60 cm MV E velocity: 131.00 cm/s MV A velocity: 103.00 cm/s MV E/A ratio:  1.27 Dorris Carnes MD Electronically signed by Dorris Carnes MD Signature Date/Time: 11/08/2020/7:14:42 PM    Final     Medications: Infusions: . sodium chloride 20 mL/hr at 11/07/20 1545  . cefTRIAXone (ROCEPHIN)  IV 1 g (11/08/20 1835)    Scheduled Medications: . sodium chloride   Intravenous Once  . amLODipine  10 mg Oral Daily  . furosemide  60 mg Intravenous Q12H  . heparin  5,000 Units Subcutaneous Q8H  . hydrocortisone cream   Topical BID  . influenza vac split quadrivalent PF  0.5 mL Intramuscular Tomorrow-1000  . labetalol  400 mg Oral BID  . sodium bicarbonate  650 mg Oral BID    have reviewed scheduled and prn medications.  Physical Exam: General:NAD, comfortable Heart:RRR, s1s2 nl, no rubs appreciated Lungs:clear b/l, no crackle Abdomen:soft, Non-tender, non-distended Extremities: Lower extremity edema+ Neurology: Alert, awake, nonfocal, no asterixis  Trimaine Maser Tanna Furry 11/09/2020,2:34 PM  LOS: 2 days  Pager: [phone]

## 2020-11-09 NOTE — Consult Note (Addendum)
Cardiology Consultation:   Patient ID: Cathy [name] MRN: [id number]; DOB: May 09, 1981  Admit date: 11/07/2020 Date of Consult: 11/09/2020  Primary Care Provider: Patient, No Pcp Per [place] Cardiologist: New to [place]; Dr. L[facility] Electrophysiologist:  None    Patient Profile:   Cathy [name] is a 39 y.o. 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 Erlinda Hong.  History of Present Illness:   Cathy [name] presented to [facility] ED 11/06/20 with multiple complaints however did not stay for evaluation. She came back 11/07/20 with ongoing complaints including abdominal discomfort, diarrhea, LE edema, cough, chest tightness, SOB, and wheezing. She reported being out of her amlodipine for the past 2 weeks. Seen at an urgent care facility 10/17/20 for klebsiella UTI and found to have hypertensive urgency. She was recommended to follow-up with her PCP/rheumatologist, however failed to do so.   She has not been seen by cardiology in the past. She has a longstanding history of poorly controlled HTN and medication non-compliance. Prior to this admission her last echocardiogram 01/2019 showed EF 60-65%, G2DD, no RWMA, mild LVH, moderate LAE, mild RAE, myxomatous mitral valve, and trival to small circumferential pericardial effusion.   At the time of this evaluation she notes improvement in her symptoms. She reports swelling has improved significantly. She reports she started feeling poorly 2-3 months ago and symptoms rapidly progressed over the past month. She describes chronic DOE and exertional chest pain for quite some time which does not appear to have worsened recently. She has chronic orthopnea and nightly PND. She has daytime somnolence. No prior sleep study. She has a longstanding history of HTN  but has been off medications for >1 month. She does not monitor her blood pressures at home. She reports poor appetite eating 2 meals a day. She was congratulated on quitting smoking 1 month ago. She denies ETOH use. She admits to some marijuana use but denies cocaine or heroin use.   On arrival to the ED she was hypertensive with BP 173/113, otherwise VSS. Labs on arrival notable for K 5.1, Cr 2.5, Albumin 1.5, Hgb 7.3, PLT 171, Beta hcg negative, TSH wnl, iron studies with low iron/TIBC and elevated Ferritin. UA postitive and UCx with klebsiella resistant to ampicillin and nitrofurantoin. She was started on IV antibiotics. Hgb dropped to 6.2 and improved to 8.4 following 1 uPRBC. CT A/P showed anasarca with signs of anemia, marked gastric fold thickening due to hypoproteinemia or severe anasarca, and non-specific lymphadenopathy. She was admitted to medicine and nephrology consulted to assist with volume management. Lupus studies pending. She was started on IV lasix with IV albumin. Echo showed EF 60-65%, severe LVH, no RWMA, normal RV function, large circumferential pericardial effusion without tamponade, and mild eccentric MR. Cardiology asked to evaluate for pericardial effusion.    Past Medical History:  Diagnosis Date  . Anxiety   . Asthma   . Bipolar depression ([place])   . Depression   . Gonorrhea   . Lupus ([place])   . Pelvic inflammatory disease (PID)   . Renal hypertension   . Trichomonas infection     Past Surgical History:  Procedure Laterality Date  . RENAL BIOPSY    . TUBAL LIGATION N/A 02/03/2019   Procedure: POST PARTUM TUBAL LIGATION;  Surgeon: Aletha Halim, MD;  Location: [facility] LD ORS;  Service: Gynecology;  Laterality: N/A;     Home Medications:  Prior to Admission medications   Medication Sig Start Date End Date Taking? Authorizing Provider  amLODipine (NORVASC) 10 MG tablet Take 1 tablet (10 mg total) by mouth daily. 10/17/20  Yes Bast, Traci A, NP  azaTHIOprine (IMURAN)  50 MG tablet Take 1 tablet (50 mg total) by mouth 2 (two) times daily. Patient not taking: Reported on 11/07/2020 09/29/18   Jonetta Osgood, MD  famotidine (PEPCID) 20 MG tablet Take 1 tablet (20 mg total) by mouth 2 (two) times daily. Patient not taking: Reported on 11/07/2020 10/18/18   Truett Mainland, DO  labetalol (NORMODYNE) 200 MG tablet Take 2 tablets (400 mg total) by mouth 2 (two) times daily. Patient not taking: No sig reported 02/06/19   Woodroe Mode, MD  lurasidone (LATUDA) 20 MG TABS tablet Take 1 tablet (20 mg total) by mouth at bedtime. Patient not taking: No sig reported 03/03/19   Truett Mainland, DO  nitrofurantoin, macrocrystal-monohydrate, (MACROBID) 100 MG capsule Take 1 capsule (100 mg total) by mouth 2 (two) times daily. Patient not taking: No sig reported 10/17/20   Loura Halt A, NP  predniSONE (DELTASONE) 20 MG tablet Take 2 tablets (40 mg total) by mouth daily with breakfast. Patient not taking: No sig reported 09/29/18   Jonetta Osgood, MD    Inpatient Medications: Scheduled Meds: . sodium chloride   Intravenous Once  . amLODipine  10 mg Oral Daily  . furosemide  60 mg Intravenous Q12H  . heparin  5,000 Units Subcutaneous Q8H  . hydrocortisone cream   Topical BID  . influenza vac split quadrivalent PF  0.5 mL Intramuscular Tomorrow-1000  . labetalol  400 mg Oral BID  . sodium bicarbonate  650 mg Oral BID   Continuous Infusions: . sodium chloride 20 mL/hr at 11/07/20 1545  . cefTRIAXone (ROCEPHIN)  IV 1 g (11/08/20 1835)   PRN Meds: acetaminophen **OR** acetaminophen, HYDROcodone-acetaminophen, morphine injection, polyethylene glycol, prochlorperazine  Allergies:   No Known Allergies  Social History:   Social History   Socioeconomic History  . Marital status: Single    Spouse name: Not on file  . Number of children: Not on file  . Years of education: Not on file  . Highest education level: Not on file  Occupational History  . Not on file   Tobacco Use  . Smoking status: Former Smoker    Packs/day: 0.25    Types: Cigarettes    Quit date: 10/08/2020    Years since quitting: 0.0  . Smokeless tobacco: Never Used  Vaping Use  . Vaping Use: Never used  Substance and Sexual Activity  . Alcohol use: Not Currently  . Drug use: Not Currently    Types: Marijuana    Comment: 3 times a week   . Sexual activity: Not Currently    Birth control/protection: None  Other Topics Concern  . Not on file  Social History Narrative  . Not on file   Social Determinants of Health   Financial Resource Strain: Not on file  Food Insecurity: Not on file  Transportation Needs: Not on file  Physical Activity: Not on file  Stress: Not on file  Social Connections: Not on file  Intimate Partner Violence: Not on file    Family History:    Family History  Problem Relation Age of Onset  . Diabetes Maternal Grandmother   . Hypertension Maternal Grandmother   .  Diabetes Maternal Grandfather   . Hypertension Maternal Grandfather   . Diabetes Paternal Grandmother   . Hypertension Paternal Grandmother   . Diabetes Paternal Grandfather   . Hypertension Paternal Grandfather      ROS:  Please see the history of present illness.   All other ROS reviewed and negative.     Physical Exam/Data:   Vitals:   11/08/20 1544 11/08/20 1820 11/08/20 2051 11/09/20 0546  BP: 127/82 140/88 (!) 153/102 (!) 133/105  Pulse: 88 75 77 72  Resp: 18 18 16 14   Temp: 98 F (36.7 C) 98 F (36.7 C) 99.2 F (37.3 C) 98.3 F (36.8 C)  TempSrc: Oral Oral Oral Oral  SpO2: 100% 99% 99% 98%  Weight:    71.8 kg  Height:        Intake/Output Summary (Last 24 hours) at 11/09/2020 0948 Last data filed at 11/09/2020 0600 Gross per 24 hour  Intake 1822.33 ml  Output 600 ml  Net 1222.33 ml   Last 3 Weights 11/09/2020 11/08/2020 11/06/2020  Weight (lbs) 158 lb 4.6 oz 140 lb 143 lb  Weight (kg) 71.8 kg 63.504 kg 64.864 kg     Body mass index is 24.79 kg/m.   General:  Chronically ill female who appears older than stated age 88: sclera anicteric Neck: no JVD Vascular: No carotid bruits; distal pulses 2+ bilaterally   Cardiac:  normal S1, S2; RRR; +murmur, no rubs or gallops Lungs: crackle at left lung base, otherwise no wheezing/rhonchi  Abd: soft, mildly distended, nontender, no hepatomegaly  Ext: no edema Musculoskeletal:  No deformities, BUE and BLE strength normal and equal Skin: warm and dry  Neuro:  CNs 2-12 intact, no focal abnormalities noted Psych:  Normal affect   EKG:  The EKG was personally reviewed and demonstrates:  None this admission Telemetry:  Telemetry was personally reviewed and demonstrates:  Not currently on telemetry  Relevant CV Studies: Echocardiogram 11/08/20: 1. Left ventricular ejection fraction, by estimation, is 60 to 65%. The  left ventricle has normal function. The left ventricle has no regional  wall motion abnormalities. There is severe left ventricular hypertrophy.  2. Right ventricular systolic function is normal. The right ventricular  size is normal. Mildly increased right ventricular wall thickness. There  is normal pulmonary artery systolic pressure.  3. Large pericardial effusion surrounds heart Measures 17 mm in maximal  dimension. Mitral inflow is without significant respiratory variation;  there is mild RV indentation but no RV collapse But IVC is dilated Does  not meet echo criteria for tamponade  CLinical correlation indicated. . Large pericardial effusion. The  pericardial effusion is circumferential.  4. MR is eccentric, directed more posterior into LA. The mitral valve is  abnormal. Mild mitral valve regurgitation.  5. The aortic valve is tricuspid. Aortic valve regurgitation is trivial.  Mild aortic valve sclerosis is present, with no evidence of aortic valve  stenosis.   Laboratory Data:  High Sensitivity Troponin:  No results for input(s): TROPONINIHS in the last 720 hours.    Chemistry Recent Labs  Lab 11/07/20 1104 11/08/20 0603 11/09/20 0707  NA 133* 133* 137  K 5.1 4.6 4.6  CL 107 108 108  CO2 17* 16* 20*  GLUCOSE 99 103* 99  BUN 53* 46* 54*  CREATININE 2.50* 2.26* 2.21*  CALCIUM 6.9* 6.9* 7.0*  GFRNONAA 25* 28* 29*  ANIONGAP 9 9 9     Recent Labs  Lab 11/06/20 1759 11/07/20 1104 11/08/20 0603 11/09/20 0707  PROT 5.0*  4.8*  --   --   ALBUMIN 1.6* 1.5* 2.3* 2.2*  AST 22 22  --   --   ALT 14 14  --   --   ALKPHOS 84 89  --   --   BILITOT 0.3 0.4  --   --    Hematology Recent Labs  Lab 11/07/20 1104 11/07/20 2245 11/08/20 0603 11/09/20 0707  WBC 7.2  --  5.7 5.1  RBC 2.64*  --  2.41* 2.89*  HGB 7.3* 6.2* 6.9* 8.4*  HCT 22.6* 18.6* 20.8* 25.2*  MCV 85.6  --  86.3 87.2  MCH 27.7  --  28.6 29.1  MCHC 32.3  --  33.2 33.3  RDW 15.4  --  15.3 15.0  PLT 171  --  155 150   BNPNo results for input(s): BNP, PROBNP in the last 168 hours.  DDimer No results for input(s): DDIMER in the last 168 hours.   Radiology/Studies:  CT ABDOMEN PELVIS WO CONTRAST  Addendum Date: 11/07/2020   ADDENDUM REPORT: 11/07/2020 15:12 ADDENDUM: These results were called by telephone at the time of interpretation on 11/07/2020 at 3:12 pm to provider Lacretia Leigh , who verbally acknowledged these results. Electronically Signed   By: Zetta Bills M.D.   On: 11/07/2020 15:12   Result Date: 11/07/2020 CLINICAL DATA:  Abdominal distension, throat burning and cough EXAM: CT ABDOMEN AND PELVIS WITHOUT CONTRAST TECHNIQUE: Multidetector CT imaging of the abdomen and pelvis was performed following the standard protocol without IV contrast. COMPARISON:  CT angiography of the chest of February 01, 2019 FINDINGS: Lower chest: Cardiomegaly. Heart is incompletely imaged. Volume loss in the LEFT lung base. Moderate pericardial effusion with low-attenuation of cardiac blood pool compatible with anemia. Hepatobiliary: Liver with normal contour and size. No gallbladder distension.  Pancreas: Normal contour of the pancreas. Generalized stranding throughout the abdomen not concentrated in the area of the pancreas in seen in the setting of anasarca is of uncertain significance. Spleen: Spleen normal size.  Normal contour of the spleen. Adrenals/Urinary Tract: Adrenal glands normal. Bilateral perinephric stranding. No signs of hydronephrosis. No radiopaque calculi. Urinary bladder under distended limiting assessment. Stomach/Bowel: Fold thickening, a very pronounced fold thickening in the stomach. Generalized edema in the small bowel mesentery. Haustral thickening throughout the entire colon. Bowel edema in the small bowel without focal abnormality. No sign of bowel obstruction. Dilute contrast extends into the colon, through the small bowel. Appendix is normal Vascular/Lymphatic: No aneurysmal dilation of the abdominal aorta. Smooth contour of the IVC. 17 mm LEFT para-aortic lymph node. No additional adenopathy visualized in the upper abdomen though there is extensive edema which limits assessment. Anasarca blurs boundaries between fat, solid and hollow viscera in the abdomen and musculoskeletal structures in the body wall. Enlarged lymph nodes about the bilateral inguinal regions and in the pelvis (image 68, series 2) 1.4 cm LEFT external iliac lymph node and generalized nodal enlargement with similar size in the bilateral external iliac chains and along is the RIGHT and LEFT groin. Reproductive: Bilateral tubal ligation clips. No adnexal masses. Limited assessment on noncontrast imaging. Other: Small volume ascites in the pelvis.  No free air. Musculoskeletal: No acute bone finding. No destructive bone process. IMPRESSION: 1. Anasarca with signs of anemia. 2. Marked gastric fold thickening may be due to hyperproteinemia or severe anasarca. Correlate with symptoms of gastritis and consider endoscopic assessment for further evaluation. 3. Haustral thickening of the colon also may be related to  anasarca, correlate with any  symptoms of colitis. Would also consider correlation with lactate though again findings are diffuse, generalized in seen in the setting of anasarca. 4. Generalized nodal enlargement seen mainly in the pelvis findings are nonspecific, raising the question of lymphoproliferative disorder and not well evaluated given lack of contrast and diffuse marked anasarca. Would also correlate with processes such as HIV infection which could cause generalized nodal enlargement. 5. Bilateral perinephric stranding. Seen in the setting of anasarca without signs of hydronephrosis. 6. Lingular consolidative changes partially visualized may simply be due to volume loss though are not well assessed. Consider follow-up to ensure resolution. Correlate with any respiratory symptoms. 7. Aortic atherosclerosis. Aortic Atherosclerosis (ICD10-I70.0). Electronically Signed: By: Zetta Bills M.D. On: 11/07/2020 15:04   DG Chest Port 1 View  Result Date: 11/07/2020 CLINICAL DATA:  Shortness of breath, cough EXAM: PORTABLE CHEST 1 VIEW COMPARISON:  02/01/2019 FINDINGS: Cardiomegaly. No confluent opacities or effusions. No edema. No acute bony abnormality. IMPRESSION: Cardiomegaly.  No active disease. Electronically Signed   By: Rolm Baptise M.D.   On: 11/07/2020 11:53   ECHOCARDIOGRAM COMPLETE  Result Date: 11/08/2020    ECHOCARDIOGRAM REPORT   Patient Name:   Cathy [name] Date of Exam: 11/08/2020 Medical Rec #:  [id number]        Height:       67.0 in Accession #:    [id number]       Weight:       143.0 lb Date of Birth:  [birth year]       BSA:          1.754 m Patient Age:    89 years         BP:           163/109 mmHg Patient Gender: F                HR:           75 bpm. Exam Location:  Inpatient Procedure: 2D Echo, 3D Echo, Cardiac Doppler and Color Doppler REPORT CONTAINS CRITICAL RESULT Indications:    Anasarca  History:        Patient has prior history of Echocardiogram examinations, most                  recent 02/02/2019. Risk Factors:Hypertension. Substance abuse.                 Pulmonary edema. Renal failure. Lupus.  Sonographer:    Roseanna Rainbow RDCS Referring Phys: [id number] [place]  1. Left ventricular ejection fraction, by estimation, is 60 to 65%. The left ventricle has normal function. The left ventricle has no regional wall motion abnormalities. There is severe left ventricular hypertrophy.  2. Right ventricular systolic function is normal. The right ventricular size is normal. Mildly increased right ventricular wall thickness. There is normal pulmonary artery systolic pressure.  3. Large pericardial effusion surrounds heart Measures 17 mm in maximal dimension. Mitral inflow is without significant respiratory variation; there is mild RV indentation but no RV collapse But IVC is dilated Does not meet echo criteria for tamponade CLinical correlation indicated. . Large pericardial effusion. The pericardial effusion is circumferential.  4. MR is eccentric, directed more posterior into LA. The mitral valve is abnormal. Mild mitral valve regurgitation.  5. The aortic valve is tricuspid. Aortic valve regurgitation is trivial. Mild aortic valve sclerosis is present, with no evidence of aortic valve stenosis. FINDINGS  Left Ventricle: Left ventricular ejection fraction, by estimation, is  60 to 65%. The left ventricle has normal function. The left ventricle has no regional wall motion abnormalities. The left ventricular internal cavity size was normal in size. There is  severe left ventricular hypertrophy. Right Ventricle: The right ventricular size is normal. Mildly increased right ventricular wall thickness. Right ventricular systolic function is normal. There is normal pulmonary artery systolic pressure. The tricuspid regurgitant velocity is 2.39 m/s, and with an assumed right atrial pressure of 3 mmHg, the estimated right ventricular systolic pressure is 83.4 mmHg. Left Atrium: Left atrial size  was normal in size. Right Atrium: Right atrial size was normal in size. Pericardium: Large pericardial effusion surrounds heart Measures 17 mm in maximal dimension. Mitral inflow is without significant respiratory variation; there is mild RV indentation but no RV collapse But IVC is dilated Does not meet echo criteria for tamponade CLinical correlation indicated. A large pericardial effusion is present. The pericardial effusion is circumferential. Mitral Valve: MR is eccentric, directed more posterior into LA. The mitral valve is abnormal. There is mild thickening of the mitral valve leaflet(s). Mild mitral valve regurgitation. Tricuspid Valve: The tricuspid valve is normal in structure. Tricuspid valve regurgitation is not demonstrated. Aortic Valve: The aortic valve is tricuspid. Aortic valve regurgitation is trivial. Mild aortic valve sclerosis is present, with no evidence of aortic valve stenosis. Aortic valve mean gradient measures 7.0 mmHg. Aortic valve peak gradient measures 14.9 mmHg. Aortic valve area, by VTI measures 3.44 cm. Pulmonic Valve: The pulmonic valve was grossly normal. Pulmonic valve regurgitation is trivial. Aorta: The aortic root is normal in size and structure. IAS/Shunts: The interatrial septum was not assessed. Additional Comments: Mild ascites is present.  LEFT VENTRICLE PLAX 2D LVIDd:         4.20 cm      Diastology LVIDs:         2.80 cm      LV e' medial:    13.20 cm/s LV PW:         2.10 cm      LV E/e' medial:  9.9 LV IVS:        1.50 cm      LV e' lateral:   12.10 cm/s LVOT diam:     2.10 cm      LV E/e' lateral: 10.8 LV SV:         125 LV SV Index:   71 LVOT Area:     3.46 cm  LV Volumes (MOD) LV vol d, MOD A2C: 105.0 ml LV vol d, MOD A4C: 118.0 ml LV vol s, MOD A2C: 32.1 ml LV vol s, MOD A4C: 43.8 ml LV SV MOD A2C:     72.9 ml LV SV MOD A4C:     118.0 ml LV SV MOD BP:      73.2 ml RIGHT VENTRICLE             IVC RV S prime:     17.50 cm/s  IVC diam: 2.70 cm TAPSE (M-mode): 3.7 cm  LEFT ATRIUM             Index       RIGHT ATRIUM           Index LA diam:        4.00 cm 2.28 cm/m  RA Area:     21.00 cm LA Vol (A2C):   49.3 ml 28.11 ml/m RA Volume:   63.60 ml  36.27 ml/m LA Vol (A4C):   56.0 ml 31.94 ml/m LA  Biplane Vol: 52.5 ml 29.94 ml/m  AORTIC VALVE                    PULMONIC VALVE AV Area (Vmax):    3.09 cm     PR End Diast Vel: 2.28 msec AV Area (Vmean):   3.18 cm AV Area (VTI):     3.44 cm AV Vmax:           193.00 cm/s AV Vmean:          121.000 cm/s AV VTI:            0.362 m AV Peak Grad:      14.9 mmHg AV Mean Grad:      7.0 mmHg LVOT Vmax:         172.00 cm/s LVOT Vmean:        111.000 cm/s LVOT VTI:          0.360 m LVOT/AV VTI ratio: 0.99  AORTA Ao Root diam: 3.20 cm MITRAL VALVE                 TRICUSPID VALVE MV Area (PHT): 3.37 cm      TR Peak grad:   22.8 mmHg MV Decel Time: 225 msec      TR Vmax:        239.00 cm/s MR Peak grad:    113.6 mmHg MR Mean grad:    67.0 mmHg   SHUNTS MR Vmax:         533.00 cm/s Systemic VTI:  0.36 m MR Vmean:        381.0 cm/s  Systemic Diam: 2.10 cm MR PISA:         2.26 cm MR PISA Eff ROA: 16 mm MR PISA Radius:  0.60 cm MV E velocity: 131.00 cm/s MV A velocity: 103.00 cm/s MV E/A ratio:  1.27 Dorris Carnes MD Electronically signed by Dorris Carnes MD Signature Date/Time: 11/08/2020/7:14:42 PM    Final      Assessment and Plan:   1. Pericardial effusion: patient presented with generalized swelling and other non-specific complaints. Found to have anasarca likely 2/2 volume overload in the setting of nephrotic syndrome. Nephrology following for assistance with diuresis. Echo revealed large circumferential pericardial effusion without evidence of tamponade. She has been diuresing with IV lasix 60mg  BID with albumin. UOP remains +1.2L in the past 24 hours and +3L this admission. Suspect inaccurate weights (158lbs today, up from 140lbs yesterday). - No indication for urgent pericardiocentesis given lack of tamponade on echo yesterday and  stable vitals signs. Will continue watchful waiting - Will place on telemetry for close monitoring - Continue diuresis per nephrology  2. Chest pain/DOE: these appear to be chronic issues occurring for years. No recent change and no complaints of chest pain or SOB at rest. Risk factors for CAD include poorly controlled HTN, SLE, and evidence of aortic atherosclerosis on CT scan. Non-fasting lipids this admission with LDL 109. Echo with EF 60-65% with no RWMA, severe LVH, and a large pericardial effusion with out tamponade.   - Favor ongoing efforts to diurese - Can consider an outpatient ischemic evaluation once volume status and HTN management stabilize.    3. HTN: BP poorly controlled in the setting of medication non-compliance. Previously prescribed amlodipine and labetalol restarted - Continue current medications - further titration pending ongoing diuresis  4. Anemia: Hgb dropped to 6.2 this admission, up to 8.4 following 1 uPRBC. No complaints of bleeding. Felt to be 2/2  lupus.  - Continue management per primary team  5. AoCKD stage 2/3: Cr 2.5 on admission, down to 2.2 today. Nephrology following given history of lupus nephritis on prior kidney biopsy.  - Continue close monitoring and management per nephrology  6. 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 Lupus: diagnosed in 2019. Has not been on medications. Lupus labs pending. Needs to establish with rheumatology  - Continue management per primary team  8. UTI: klebsiella + UCx. Currently on IV antibiotics - Continue management per primary team     For questions or updates, please contact [place] Please consult [email or web address] for contact info under    Signed, Abigail Butts, PA-C  11/09/2020 9:48 AM As above, patient seen and examined. Briefly she is a 39 year old female with past medical history of hypertension,  systemic lupus erythematosus, bipolar disorder, chronic stage III kidney disease, history of substance abuse, noncompliance for evaluation of pericardial effusion. Patient has chronic dyspnea on exertion and has had chest pain since she was a child by her report. She states that over the past 2 months her dyspnea on exertion has worsened. She also has developed diffuse edema in her extremities and abdomen. She has not been taking her medications for lupus. She has not followed up with her rheumatologist. She has been diuresed since admission with some improvement. Echocardiogram this admission showed normal LV function, severe left ventricular hypertrophy, large pericardial effusion without signs of tamponade and mild mitral vegetation. Cardiology now asked to evaluate. Blood pressure is 141/101 and pulse is 74. BUN 54 and creatinine 2.21. Hemoglobin 8.4.  1 pericardial effusion-I have personally reviewed the patient's echocardiogram. She appears to have a large pericardial effusion more prominent in the posterior lateral distribution. There is no RV collapse and no respiratory flow variation. Her IVC is dilated. Clinically she is not in tamponade as her blood pressure is elevated and she is not tachycardic. Pericardial fusion is likely from untreated lupus. Would review with rheumatology; likely needs previous lupus medications resumed. There is no indication for pericardiocentesis at present. We will plan to repeat echocardiogram early next week.  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 Her albumin is also significantly decreased likely contributing. She is presently being diuresed with Lasix per nephrology. Follow renal function closely.  3 hypertension-blood pressure is elevated but amlodipine recently started. Follow and adjust regimen as needed.  4 history of systemic lupus erythematosus-likely would benefit from resuming  steroids and CellCept but will leave to primary service.  5 acute on chronic stage III kidney disease-followed by nephrology.  Kirk Ruths, MD

## 2020-11-09 NOTE — TOC Initial Note (Signed)
Transition of Care [facility]) - Initial/Assessment Note    Patient Details  Name: Cathy [name] MRN: [id number] Date of Birth: 1981/05/10  [facility] CM/SW Contact:    Lynnell Catalan, RN Phone Number: 11/09/2020, 12:01 PM  Clinical Narrative:                  [facility] consult for "trouble affording medication". Spoke with pt at bedside. She states that she was dropped by Medicaid recently and plans to call DSS to enquire about disability and re-applying for Medicaid. Pt could potentially qualify for [facility] program if dc medications are needed. Also, could benefit from [facility].  TOC will continue to follow.             Activities of Daily Living Home Assistive Devices/Equipment: None ADL Screening (condition at time of admission) Patient's cognitive ability adequate to safely complete daily activities?: Yes Is the patient deaf or have difficulty hearing?: No Does the patient have difficulty seeing, even when wearing glasses/contacts?: Yes (blurry) Does the patient have difficulty concentrating, remembering, or making decisions?: Yes Patient able to express need for assistance with ADLs?: Yes Does the patient have difficulty dressing or bathing?: No Independently performs ADLs?: Yes (appropriate for developmental age) Does the patient have difficulty walking or climbing stairs?: Yes Weakness of Legs: Both Weakness of Arms/Hands: Both Admission diagnosis:  Lower urinary tract infectious disease [N39.0] Anasarca [R60.1] Patient Active Problem List   Diagnosis Date Noted  . Aortic atherosclerosis ([place]) 11/07/2020  . Anasarca 11/07/2020  . Abdominal pain 11/07/2020  . Diarrhea 11/07/2020  . 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 Tubal ligation evaluation 02/03/2019  . Renal hypertension 02/02/2019  . Pulmonary edema 02/01/2019  . AMA (advanced maternal age) multigravida 35+ 01/12/2019  . Non compliance w medication regimen  01/12/2019  . Current chronic use of systemic steroids 01/12/2019  . Unwanted fertility 12/10/2018  . Anemia 10/12/2018  . Supervision of high risk pregnancy, antepartum 10/04/2018  . Lupus ([place]) 09/20/2018  . Supervision of pregnancy with grand multiparity in second trimester 09/20/2018  . Substance abuse ([place]) 09/20/2018  . Acute renal failure (ARF) ([place]) 09/20/2018  . MDD (major depressive disorder) 10/11/2015  . Asthma 09/22/2011   PCP:  Patient, No Pcp Per Pharmacy:   [id number] - [place], [place] [address] Phone: [phone] Fax: [phone]     Social Determinants of Health (SDOH) Interventions    Readmission Risk Interventions No flowsheet data found.

## 2020-11-09 NOTE — Progress Notes (Signed)
PROGRESS NOTE    Cathy [name]  LNL:[id number] DOB: 1981-07-08 DOA: 11/07/2020 PCP: Patient, No Pcp Per    Chief Complaint  Patient presents with  . Dysuria  . Leg Swelling    bilateral  . Cough    Brief Narrative:  This is a 39 year old female with past medical history of bipolar depression, SLE, PID, renal hypertension, substance abuse who presented to the ED with 3 weeks of multiple nonspecific complaints most notably abdominal discomfort and distention.  Associated with lower extremity and abdominal edema.  Has a mild cough with some chest tightness associated with coughing and DOE.  Also some wheezing.  No fever or chills.  She was recently seen in the urgent care on 11/17 at which time she had similar complaints and found to have a UTI, hypertensive urgency and was referred to primary care and rheumatology.  She has not been seen by either.  States she ran out of her amlodipine 2 weeks ago and is currently not taking any medications at all  Subjective:  600cc urine output documented last 24hrs, not sure if accurate She reports remain edematous but improving, lower extremity edema has almost resolved, abdomen is softer, less tight, facial edema is improving as well She has productive cough, reports quit smoking a month ago because feeling bad Report having diarrhea every time she eat for the last 1 to 78-month, no diarrhea since admitted Reports chronic itchy and painful rash on buttocks and thighs, underwent biopsy last year, but did not follow up on result No fever  She is a poor historian, she states no medical follow ups after lost her insurance   Assessment & Plan:   Principal Problem:   Anasarca Active Problems:   Lupus ([place])   Acute renal failure (ARF) ([place])   Anemia   Aortic atherosclerosis ([facility])   Abdominal pain   Diarrhea   AKI with generalized edema, hypoalbumin anemia, proteinuria with history of uncontrolled lupus -Urine culture greater than 100,000  Klebsiella pneumoniae, currently on Rocephin -Nephrology consulted, currently on IV Lasix 60 mg twice a day, getting IV albumin infusion,defer renal biopsy and 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 Correlate with symptoms of gastritis and consider endoscopic assessment for further evaluation. Haustral thickening of the colon also may be related to anasarca, correlate with any symptoms of colitis. Would also consider correlation with lactate though again findings are diffuse, generalized in seen in the setting of anasarca. Currently getting IV albumin and IV Lasix  Large pericardioeffusion No sign of tamponade Cardiology consulted  Hypertension -Noncompliance with medication -Continue Norvasc and labetalol, IV Lasix    Lupus Per chart review was on hydroxychloroquine and prednisone Does not appear to be compliance with medication Does not appear to follow with rheumatology regularly Will discuss with rheumatology   Lymphadenopathy on CT abdomen pelvis  Aortic atherosclerosis, hyperlipidemia, will discuss statin with patient, however compliance is an issue  She reported loss insurance, will consult case manager   DVT prophylaxis: heparin injection 5,000 Units Start: 11/07/20 1630   Code Status: Full Family  Communication: Patient Disposition:   Status is: Inpatient  Dispo: The patient is from: Home              Anticipated d/c is to: Home              Anticipated d/c date is:                Patient currently not medically ready to discharge  Consultants:   Nephrology  Cardiology  Rheumatology  Procedures:   PRBC transfusion x1  Antimicrobials:   Rocephin     Objective: Vitals:   11/08/20 1544 11/08/20 1820 11/08/20 2051 11/09/20 0546  BP: 127/82 140/88 (!) 153/102 (!) 133/105  Pulse: 88 75 77 72  Resp: 18 18 16 14   Temp: 98 F (36.7 C) 98 F (36.7 C) 99.2 F (37.3 C) 98.3 F (36.8 C)  TempSrc: Oral Oral Oral Oral  SpO2: 100% 99% 99% 98%  Weight:    71.8 kg  Height:        Intake/Output Summary (Last 24 hours) at 11/09/2020 0925 Last data filed at 11/09/2020 0600 Gross per 24 hour  Intake 1822.33 ml  Output 600 ml  Net 1222.33 ml   Filed Weights   11/08/20 1543 11/09/20 0546  Weight: 63.5 kg 71.8 kg    Examination:  General exam: Generalized edema, NAD Respiratory system: Diminished at bases, Respiratory effort normal. Cardiovascular system: S1 & S2 heard, RRR. No JVD, no murmur, No pedal edema. Gastrointestinal system: Protuberant abdomen , soft and nontender. Normal bowel sounds heard. Central nervous system: Alert and oriented. No focal neurological deficits. Extremities: Symmetric 5 x 5 power. Skin: Chronic rash bilateral buttock and thighs, no drainage, no open wound, does not appear infected Psychiatry: Calm and cooperative, but poor historian    Data Reviewed: I have personally reviewed following labs and imaging studies  CBC: Recent Labs  Lab 11/06/20 1759 11/07/20 1104 11/07/20 2245 11/08/20 0603 11/09/20 0707  WBC 7.7 7.2  --  5.7 5.1  NEUTROABS  --  5.3  --   --   --   HGB 7.5* 7.3* 6.2* 6.9* 8.4*  HCT 22.9* 22.6* 18.6* 20.8* 25.2*  MCV 87.7 85.6  --  86.3 87.2  PLT 147* 171  --  155 193    Basic Metabolic Panel: Recent Labs  Lab 11/06/20 1759 11/07/20 1104 11/08/20 0603 11/09/20 0707  NA 134* 133* 133* 137  K 5.2* 5.1 4.6 4.6  CL 106 107 108 108  CO2 20* 17* 16* 20*  GLUCOSE 102* 99 103* 99  BUN 54*  53* 46* 54*  CREATININE 2.61* 2.50* 2.26* 2.21*  CALCIUM 7.1* 6.9* 6.9* 7.0*  PHOS  --   --   --  6.2*    GFR: Estimated Creatinine Clearance: 33.9 mL/min (A) (by C-G formula based on SCr of 2.21 mg/dL (H)).  Liver Function Tests: Recent Labs  Lab 11/06/20 1759 11/07/20 1104 11/08/20 0603 11/09/20 0707  AST 22 22  --   --   ALT 14 14  --   --   ALKPHOS 84 89  --   --   BILITOT 0.3 0.4  --   --   PROT 5.0* 4.8*  --   --   ALBUMIN 1.6* 1.5* 2.3* 2.2*    CBG: No results for input(s): GLUCAP in the last 168 hours.   Recent Results (from the past 240 hour(s))  Resp Panel by RT-PCR (Flu A&B, Covid) Nasopharyngeal Swab     Status: None  Collection Time: 11/07/20 11:06 AM   Specimen: Nasopharyngeal Swab; Nasopharyngeal(NP) swabs in vial transport medium  Result Value Ref Range Status   SARS Coronavirus 2 by RT PCR NEGATIVE NEGATIVE Final    Comment: (NOTE) SARS-CoV-2 target nucleic acids are NOT DETECTED.  The SARS-CoV-2 RNA is generally detectable in upper respiratory specimens during the acute phase of infection. The lowest concentration of SARS-CoV-2 viral copies this assay can detect is 138 copies/mL. A negative result does not preclude SARS-Cov-2 infection and should not be used as the sole basis for treatment or other patient management decisions. A negative result may occur with  improper specimen collection/handling, submission of specimen other than nasopharyngeal swab, presence of viral mutation(s) within the areas targeted by this assay, and inadequate number of viral copies(<138 copies/mL). A negative result must be combined with clinical observations, patient history, and epidemiological information. The expected result is Negative.  Fact Sheet for Patients:  EntrepreneurPulse.com.au  Fact Sheet for Healthcare Providers:  IncredibleEmployment.be  This test is no t yet approved or cleared by the [place] FDA and   has been authorized for detection and/or diagnosis of SARS-CoV-2 by FDA under an Emergency Use Authorization (EUA). This EUA will remain  in effect (meaning this test can be used) for the duration of the COVID-19 declaration under Section 564(b)(1) of the Act, 21 U.S.C.section 360bbb-3(b)(1), unless the authorization is terminated  or revoked sooner.       Influenza A by PCR NEGATIVE NEGATIVE Final   Influenza B by PCR NEGATIVE NEGATIVE Final    Comment: (NOTE) The Xpert Xpress SARS-CoV-2/FLU/RSV plus assay is intended as an aid in the diagnosis of influenza from Nasopharyngeal swab specimens and should not be used as a sole basis for treatment. Nasal washings and aspirates are unacceptable for Xpert Xpress SARS-CoV-2/FLU/RSV testing.  Fact Sheet for Patients: EntrepreneurPulse.com.au  Fact Sheet for Healthcare Providers: IncredibleEmployment.be  This test is not yet approved or cleared by the [place] FDA and has been authorized for detection and/or diagnosis of SARS-CoV-2 by FDA under an Emergency Use Authorization (EUA). This EUA will remain in effect (meaning this test can be used) for the duration of the COVID-19 declaration under Section 564(b)(1) of the Act, 21 U.S.C. section 360bbb-3(b)(1), unless the authorization is terminated or revoked.  Performed at [facility], [place] [address] [place], [place] [address]   Urine Culture     Status: Abnormal (Preliminary result)   Collection Time: 11/07/20 12:49 PM   Specimen: Urine, Clean Catch  Result Value Ref Range Status   Specimen Description   Final    URINE, CLEAN CATCH Performed at [facility], [place] [address], [place], [place] [address]    Special Requests   Final    NONE Performed at [facility], [place] [address] [place], [place] [address]    Culture (A)  Final    >=100,000 COLONIES/mL KLEBSIELLA  PNEUMONIAE KLEBSIELLA PNEUMONIAE    Report Status PENDING  Incomplete         Radiology Studies: CT ABDOMEN PELVIS WO CONTRAST  Addendum Date: 11/07/2020   ADDENDUM REPORT: 11/07/2020 15:12 ADDENDUM: These results were called by telephone at the time of interpretation on 11/07/2020 at 3:12 pm to provider Lacretia Leigh , who verbally acknowledged these results. Electronically Signed   By: Zetta Bills M.D.   On: 11/07/2020 15:12   Result Date: 11/07/2020 CLINICAL DATA:  Abdominal distension, throat burning and cough EXAM: CT ABDOMEN AND PELVIS WITHOUT CONTRAST  TECHNIQUE: Multidetector CT imaging of the abdomen and pelvis was performed following the standard protocol without IV contrast. COMPARISON:  CT angiography of the chest of February 01, 2019 FINDINGS: Lower chest: Cardiomegaly. Heart is incompletely imaged. Volume loss in the LEFT lung base. Moderate pericardial effusion with low-attenuation of cardiac blood pool compatible with anemia. Hepatobiliary: Liver with normal contour and size. No gallbladder distension. Pancreas: Normal contour of the pancreas. Generalized stranding throughout the abdomen not concentrated in the area of the pancreas in seen in the setting of anasarca is of uncertain significance. Spleen: Spleen normal size.  Normal contour of the spleen. Adrenals/Urinary Tract: Adrenal glands normal. Bilateral perinephric stranding. No signs of hydronephrosis. No radiopaque calculi. Urinary bladder under distended limiting assessment. Stomach/Bowel: Fold thickening, a very pronounced fold thickening in the stomach. Generalized edema in the small bowel mesentery. Haustral thickening throughout the entire colon. Bowel edema in the small bowel without focal abnormality. No sign of bowel obstruction. Dilute contrast extends into the colon, through the small bowel. Appendix is normal Vascular/Lymphatic: No aneurysmal dilation of the abdominal aorta. Smooth contour of the IVC. 17 mm LEFT  para-aortic lymph node. No additional adenopathy visualized in the upper abdomen though there is extensive edema which limits assessment. Anasarca blurs boundaries between fat, solid and hollow viscera in the abdomen and musculoskeletal structures in the body wall. Enlarged lymph nodes about the bilateral inguinal regions and in the pelvis (image 68, series 2) 1.4 cm LEFT external iliac lymph node and generalized nodal enlargement with similar size in the bilateral external iliac chains and along is the RIGHT and LEFT groin. Reproductive: Bilateral tubal ligation clips. No adnexal masses. Limited assessment on noncontrast imaging. Other: Small volume ascites in the pelvis.  No free air. Musculoskeletal: No acute bone finding. No destructive bone process. IMPRESSION: 1. Anasarca with signs of anemia. 2. Marked gastric fold thickening may be due to hyperproteinemia or severe anasarca. Correlate with symptoms of gastritis and consider endoscopic assessment for further evaluation. 3. Haustral thickening of the colon also may be related to anasarca, correlate with any symptoms of colitis. Would also consider correlation with lactate though again findings are diffuse, generalized in seen in the setting of anasarca. 4. Generalized nodal enlargement seen mainly in the pelvis findings are nonspecific, raising the question of lymphoproliferative disorder and not well evaluated given lack of contrast and diffuse marked anasarca. Would also correlate with processes such as HIV infection which could cause generalized nodal enlargement. 5. Bilateral perinephric stranding. Seen in the setting of anasarca without signs of hydronephrosis. 6. Lingular consolidative changes partially visualized may simply be due to volume loss though are not well assessed. Consider follow-up to ensure resolution. Correlate with any respiratory symptoms. 7. Aortic atherosclerosis. Aortic Atherosclerosis (ICD10-I70.0). Electronically Signed: By:  Zetta Bills M.D. On: 11/07/2020 15:04   DG Chest Port 1 View  Result Date: 11/07/2020 CLINICAL DATA:  Shortness of breath, cough EXAM: PORTABLE CHEST 1 VIEW COMPARISON:  02/01/2019 FINDINGS: Cardiomegaly. No confluent opacities or effusions. No edema. No acute bony abnormality. IMPRESSION: Cardiomegaly.  No active disease. Electronically Signed   By: Rolm Baptise M.D.   On: 11/07/2020 11:53   ECHOCARDIOGRAM COMPLETE  Result Date: 11/08/2020    ECHOCARDIOGRAM REPORT   Patient Name:   Cathy [name] Date of Exam: 11/08/2020 Medical Rec #:  [id number]        Height:       67.0 in Accession #:    [id number]  Weight:       143.0 lb Date of Birth:  1981/05/13       BSA:          1.754 m Patient Age:    39 years         BP:           163/109 mmHg Patient Gender: F                HR:           75 bpm. Exam Location:  Inpatient Procedure: 2D Echo, 3D Echo, Cardiac Doppler and Color Doppler REPORT CONTAINS CRITICAL RESULT Indications:    Anasarca  History:        Patient has prior history of Echocardiogram examinations, most                 recent 02/02/2019. Risk Factors:Hypertension. Substance abuse.                 Pulmonary edema. Renal failure. Lupus.  Sonographer:    Roseanna Rainbow RDCS Referring Phys: [id number] [place]  1. Left ventricular ejection fraction, by estimation, is 60 to 65%. The left ventricle has normal function. The left ventricle has no regional wall motion abnormalities. There is severe left ventricular hypertrophy.  2. Right ventricular systolic function is normal. The right ventricular size is normal. Mildly increased right ventricular wall thickness. There is normal pulmonary artery systolic pressure.  3. Large pericardial effusion surrounds heart Measures 17 mm in maximal dimension. Mitral inflow is without significant respiratory variation; there is mild RV indentation but no RV collapse But IVC is dilated Does not meet echo criteria for tamponade CLinical correlation  indicated. . Large pericardial effusion. The pericardial effusion is circumferential.  4. MR is eccentric, directed more posterior into LA. The mitral valve is abnormal. Mild mitral valve regurgitation.  5. The aortic valve is tricuspid. Aortic valve regurgitation is trivial. Mild aortic valve sclerosis is present, with no evidence of aortic valve stenosis. FINDINGS  Left Ventricle: Left ventricular ejection fraction, by estimation, is 60 to 65%. The left ventricle has normal function. The left ventricle has no regional wall motion abnormalities. The left ventricular internal cavity size was normal in size. There is  severe left ventricular hypertrophy. Right Ventricle: The right ventricular size is normal. Mildly increased right ventricular wall thickness. Right ventricular systolic function is normal. There is normal pulmonary artery systolic pressure. The tricuspid regurgitant velocity is 2.39 m/s, and with an assumed right atrial pressure of 3 mmHg, the estimated right ventricular systolic pressure is 64.3 mmHg. Left Atrium: Left atrial size was normal in size. Right Atrium: Right atrial size was normal in size. Pericardium: Large pericardial effusion surrounds heart Measures 17 mm in maximal dimension. Mitral inflow is without significant respiratory variation; there is mild RV indentation but no RV collapse But IVC is dilated Does not meet echo criteria for tamponade CLinical correlation indicated. A large pericardial effusion is present. The pericardial effusion is circumferential. Mitral Valve: MR is eccentric, directed more posterior into LA. The mitral valve is abnormal. There is mild thickening of the mitral valve leaflet(s). Mild mitral valve regurgitation. Tricuspid Valve: The tricuspid valve is normal in structure. Tricuspid valve regurgitation is not demonstrated. Aortic Valve: The aortic valve is tricuspid. Aortic valve regurgitation is trivial. Mild aortic valve sclerosis is present, with no  evidence of aortic valve stenosis. Aortic valve mean gradient measures 7.0 mmHg. Aortic valve peak gradient measures 14.9 mmHg. Aortic  valve area, by VTI measures 3.44 cm. Pulmonic Valve: The pulmonic valve was grossly normal. Pulmonic valve regurgitation is trivial. Aorta: The aortic root is normal in size and structure. IAS/Shunts: The interatrial septum was not assessed. Additional Comments: Mild ascites is present.  LEFT VENTRICLE PLAX 2D LVIDd:         4.20 cm      Diastology LVIDs:         2.80 cm      LV e' medial:    13.20 cm/s LV PW:         2.10 cm      LV E/e' medial:  9.9 LV IVS:        1.50 cm      LV e' lateral:   12.10 cm/s LVOT diam:     2.10 cm      LV E/e' lateral: 10.8 LV SV:         125 LV SV Index:   71 LVOT Area:     3.46 cm  LV Volumes (MOD) LV vol d, MOD A2C: 105.0 ml LV vol d, MOD A4C: 118.0 ml LV vol s, MOD A2C: 32.1 ml LV vol s, MOD A4C: 43.8 ml LV SV MOD A2C:     72.9 ml LV SV MOD A4C:     118.0 ml LV SV MOD BP:      73.2 ml RIGHT VENTRICLE             IVC RV S prime:     17.50 cm/s  IVC diam: 2.70 cm TAPSE (M-mode): 3.7 cm LEFT ATRIUM             Index       RIGHT ATRIUM           Index LA diam:        4.00 cm 2.28 cm/m  RA Area:     21.00 cm LA Vol (A2C):   49.3 ml 28.11 ml/m RA Volume:   63.60 ml  36.27 ml/m LA Vol (A4C):   56.0 ml 31.94 ml/m LA Biplane Vol: 52.5 ml 29.94 ml/m  AORTIC VALVE                    PULMONIC VALVE AV Area (Vmax):    3.09 cm     PR End Diast Vel: 2.28 msec AV Area (Vmean):   3.18 cm AV Area (VTI):     3.44 cm AV Vmax:           193.00 cm/s AV Vmean:          121.000 cm/s AV VTI:            0.362 m AV Peak Grad:      14.9 mmHg AV Mean Grad:      7.0 mmHg LVOT Vmax:         172.00 cm/s LVOT Vmean:        111.000 cm/s LVOT VTI:          0.360 m LVOT/AV VTI ratio: 0.99  AORTA Ao Root diam: 3.20 cm MITRAL VALVE                 TRICUSPID VALVE MV Area (PHT): 3.37 cm      TR Peak grad:   22.8 mmHg MV Decel Time: 225 msec      TR Vmax:        239.00 cm/s  MR Peak grad:    113.6 mmHg MR Mean grad:  67.0 mmHg   SHUNTS MR Vmax:         533.00 cm/s Systemic VTI:  0.36 m MR Vmean:        381.0 cm/s  Systemic Diam: 2.10 cm MR PISA:         2.26 cm MR PISA Eff ROA: 16 mm MR PISA Radius:  0.60 cm MV E velocity: 131.00 cm/s MV A velocity: 103.00 cm/s MV E/A ratio:  1.27 Dorris Carnes MD Electronically signed by Dorris Carnes MD Signature Date/Time: 11/08/2020/7:14:42 PM    Final         Scheduled Meds: . sodium chloride   Intravenous Once  . amLODipine  10 mg Oral Daily  . furosemide  60 mg Intravenous Q12H  . heparin  5,000 Units Subcutaneous Q8H  . influenza vac split quadrivalent PF  0.5 mL Intramuscular Tomorrow-1000  . labetalol  400 mg Oral BID  . sodium bicarbonate  650 mg Oral BID   Continuous Infusions: . sodium chloride 20 mL/hr at 11/07/20 1545  . cefTRIAXone (ROCEPHIN)  IV 1 g (11/08/20 1835)     LOS: 2 days   Time spent: 78mins Greater than 50% of this time was spent in counseling, explanation of diagnosis, planning of further management, and coordination of care.  I have personally reviewed and interpreted on  11/09/2020 daily labs, I reviewed all nursing notes, pharmacy notes, consultant notes,  vitals, pertinent old records  I have discussed plan of care as described above with RN , patient  on 11/09/2020  Voice Recognition /Dragon dictation system was used to create this note, attempts have been made to correct errors. Please contact the author with questions and/or clarifications.   Florencia Reasons, MD PhD FACP [facility]  Available via Epic secure chat 7am-7pm for nonurgent issues Please page for urgent issues To page the attending provider between 7A-7P or the covering provider during after hours 7P-7A, please log into the web site [email or web address] and access using universal [place] password for that web site. If you do not have the password, please call the hospital operator.    11/09/2020, 9:25 AM

## 2020-11-10 ENCOUNTER — Inpatient Hospital Stay (HOSPITAL_COMMUNITY): Payer: Medicaid Other

## 2020-11-10 DIAGNOSIS — D631 Anemia in chronic kidney disease: Secondary | ICD-10-CM

## 2020-11-10 DIAGNOSIS — I313 Pericardial effusion (noninflammatory): Secondary | ICD-10-CM

## 2020-11-10 DIAGNOSIS — N179 Acute kidney failure, unspecified: Secondary | ICD-10-CM

## 2020-11-10 DIAGNOSIS — I10 Essential (primary) hypertension: Secondary | ICD-10-CM

## 2020-11-10 DIAGNOSIS — M329 Systemic lupus erythematosus, unspecified: Secondary | ICD-10-CM

## 2020-11-10 DIAGNOSIS — N189 Chronic kidney disease, unspecified: Secondary | ICD-10-CM

## 2020-11-10 LAB — RENAL FUNCTION PANEL
Albumin: 2 g/dL — ABNORMAL LOW (ref 3.5–5.0)
Anion gap: 10 (ref 5–15)
BUN: 52 mg/dL — ABNORMAL HIGH (ref 6–20)
CO2: 16 mmol/L — ABNORMAL LOW (ref 22–32)
Calcium: 7 mg/dL — ABNORMAL LOW (ref 8.9–10.3)
Chloride: 109 mmol/L (ref 98–111)
Creatinine, Ser: 2.25 mg/dL — ABNORMAL HIGH (ref 0.44–1.00)
GFR, Estimated: 28 mL/min — ABNORMAL LOW (ref >60)
Glucose, Bld: 115 mg/dL — ABNORMAL HIGH (ref 70–99)
Phosphorus: 6.2 mg/dL — ABNORMAL HIGH (ref 2.5–4.6)
Potassium: 4.2 mmol/L (ref 3.5–5.1)
Sodium: 135 mmol/L (ref 135–145)

## 2020-11-10 LAB — ECHOCARDIOGRAM LIMITED
Height: 67 in
S' Lateral: 2.9 cm
Weight: 2550.28 oz

## 2020-11-10 LAB — CBC
HCT: 24.7 % — ABNORMAL LOW (ref 36.0–46.0)
Hemoglobin: 8 g/dL — ABNORMAL LOW (ref 12.0–15.0)
MCH: 28.6 pg (ref 26.0–34.0)
MCHC: 32.4 g/dL (ref 30.0–36.0)
MCV: 88.2 fL (ref 80.0–100.0)
Platelets: 165 10*3/uL (ref 150–400)
RBC: 2.8 MIL/uL — ABNORMAL LOW (ref 3.87–5.11)
RDW: 15.5 % (ref 11.5–15.5)
WBC: 4.9 10*3/uL (ref 4.0–10.5)
nRBC: 0 % (ref 0.0–0.2)

## 2020-11-10 MED ORDER — GUAIFENESIN ER 600 MG PO TB12
1200.0000 mg | ORAL_TABLET | Freq: Two times a day (BID) | ORAL | Status: DC
  Administered 2020-11-10 – 2020-11-13 (×6): 1200 mg via ORAL
  Filled 2020-11-10 (×6): qty 2

## 2020-11-10 MED ORDER — ALBUMIN HUMAN 25 % IV SOLN
25.0000 g | Freq: Four times a day (QID) | INTRAVENOUS | Status: AC
  Administered 2020-11-10 – 2020-11-11 (×3): 25 g via INTRAVENOUS
  Filled 2020-11-10 (×3): qty 100

## 2020-11-10 MED ORDER — SODIUM BICARBONATE 650 MG PO TABS
1300.0000 mg | ORAL_TABLET | Freq: Two times a day (BID) | ORAL | Status: DC
  Administered 2020-11-10 – 2020-11-13 (×6): 1300 mg via ORAL
  Filled 2020-11-10 (×6): qty 2

## 2020-11-10 MED ORDER — METHYLPREDNISOLONE SODIUM SUCC 125 MG IJ SOLR
125.0000 mg | Freq: Once | INTRAMUSCULAR | Status: AC
  Administered 2020-11-10: 14:00:00 125 mg via INTRAVENOUS
  Filled 2020-11-10: qty 2

## 2020-11-10 MED ORDER — MENTHOL 3 MG MT LOZG
1.0000 | LOZENGE | OROMUCOSAL | Status: DC | PRN
  Filled 2020-11-10 (×2): qty 9

## 2020-11-10 MED ORDER — FUROSEMIDE 10 MG/ML IJ SOLN
80.0000 mg | Freq: Two times a day (BID) | INTRAMUSCULAR | Status: DC
  Administered 2020-11-10 – 2020-11-11 (×2): 80 mg via INTRAVENOUS
  Filled 2020-11-10 (×2): qty 8

## 2020-11-10 MED ORDER — PREDNISONE 50 MG PO TABS
50.0000 mg | ORAL_TABLET | Freq: Every day | ORAL | Status: DC
  Administered 2020-11-11 – 2020-11-13 (×3): 50 mg via ORAL
  Filled 2020-11-10 (×3): qty 1

## 2020-11-10 MED ORDER — FAMOTIDINE 20 MG PO TABS
10.0000 mg | ORAL_TABLET | Freq: Every day | ORAL | Status: DC
Start: 2020-11-10 — End: 2020-11-13
  Administered 2020-11-11 – 2020-11-13 (×3): 10 mg via ORAL
  Filled 2020-11-10 (×3): qty 1

## 2020-11-10 NOTE — Progress Notes (Signed)
  Echocardiogram 2D Echocardiogram has been performed.  Cathy [name] 11/10/2020, 1:23 PM

## 2020-11-10 NOTE — Progress Notes (Signed)
[facility] PROGRESS NOTE  Assessment/ Plan: Pt is a 39 y.o. yo female  with history of hypertension, bipolar disorder, anxiety, diagnosed with lupus nephritis in 2019 when she was pregnant with AKI, nonadherence with outpatient follow-up, presented with generalized body pain, anasarca seen as a consultation for the evaluation of worsening renal failure and fluid overload.  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  She has biopsy-proven class III lupus nephritis in 2019.  She lost follow-up and did not take her medication.   UA with UTI, proteinuria, 7.4 g.  CT scan of abdomen pelvis ruled out obstruction.  She probably has progression of her nephritis and a lot of chronic changes. Ideally, we need to do kidney biopsy to evaluate the lupus nephritis and chronicity. However, given UTI and current infection unable to do kidney biopsy. She is also noncompliant with outpatient follow-ups and treatments.  Not much urine output with current diuretics therefore I am increasing Lasix to 80 mg twice a day.  Order IV albumin as well.  The BUN creatinine remains stable. When euvolemic, the plan is to place her on ACE inhibitor or ARB gradually. If she takes her medications and follows regularly then we may consider doing kidney biopsy as outpatient. She has bilateral tubal ligation for family-planning.   Avoid hypotension, NSAIDs, IV contrast.  #History of lupus nephritis class III: She had kidney biopsy in 08/2018 for nephrotic syndrome and AKI which showed focal lupus nephritis mildly active and minimally sclerosing phase.  She was not compliant with outpatient treatment and follow-up.  She was never seen by rheumatologist.  Noted complements level are low and elevated dsDNA antibody level. Also with pericardial effusion ?  Pericarditis.  I think she will benefit from steroid  therefore I will order 1 dose of Solu-Medrol and then prednisone.  I have discussed/curbside with rheumatologist as well.  #Hypertension/volume: Blood pressure medication including labetalol, amlodipine recently started.  Diuretics increased as above.  Monitor blood pressure.  #Metabolic acidosis: Increase sodium bicarbonate dose.  #Anemia, probably due to lupus: Iron saturation 18% with high ferritin. Received blood transfusion.  No sign of bleeding.  #Abdomen pain/anasarca/nephrotic syndrome: Echo with pericardial effusion seen by cardiology. Currently on diuretics.   Subjective: Seen and examined at bedside.  No nausea, vomiting, chest pain or shortness of breath.  She however has dyspnea on exertion.  Urine output 500 cc. Objective Vital signs in last 24 hours: Vitals:   11/09/20 1341 11/09/20 2100 11/10/20 0500 11/10/20 0614  BP: 130/85 (!) 141/98  (!) 157/113  Pulse: 68 74  73  Resp: 19 16  14   Temp: 97.9 F (36.6 C) 98.2 F (36.8 C)  98.2 F (36.8 C)  TempSrc: Oral Oral  Oral  SpO2: 98% 100%  99%  Weight:   72.3 kg   Height:       Weight change: 8.796 kg  Intake/Output Summary (Last 24 hours) at 11/10/2020 1221 Last data filed at 11/10/2020 6433 Gross per 24 hour  Intake 936 ml  Output 500 ml  Net 436 ml       Labs: Basic Metabolic Panel: Recent Labs  Lab 11/08/20 0603 11/09/20 0707 11/10/20 0657  NA 133* 137 135  K 4.6 4.6 4.2  CL 108 108 109  CO2 16* 20* 16*  GLUCOSE 103* 99 115*  BUN 46* 54* 52*  CREATININE 2.26* 2.21* 2.25*  CALCIUM 6.9* 7.0* 7.0*  PHOS  --  6.2* 6.2*   Liver Function Tests: Recent Labs  Lab 11/06/20 1759 11/07/20 1104 11/08/20 0603 11/09/20 0707 11/10/20 0657  AST 22 22  --   --   --   ALT 14 14  --   --   --   ALKPHOS 84 89  --   --   --   BILITOT 0.3 0.4  --   --   --   PROT 5.0* 4.8*  --   --   --   ALBUMIN 1.6* 1.5* 2.3* 2.2* 2.0*   Recent Labs  Lab 11/06/20 1759 11/07/20 1104  LIPASE 54* 51   No results  for input(s): AMMONIA in the last 168 hours. CBC: Recent Labs  Lab 11/06/20 1759 11/07/20 1104 11/07/20 2245 11/08/20 0603 11/09/20 0707 11/10/20 0657  WBC 7.7 7.2  --  5.7 5.1 4.9  NEUTROABS  --  5.3  --   --   --   --   HGB 7.5* 7.3*   < > 6.9* 8.4* 8.0*  HCT 22.9* 22.6*   < > 20.8* 25.2* 24.7*  MCV 87.7 85.6  --  86.3 87.2 88.2  PLT 147* 171  --  155 150 165   < > = values in this interval not displayed.   Cardiac Enzymes: No results for input(s): CKTOTAL, CKMB, CKMBINDEX, TROPONINI in the last 168 hours. CBG: No results for input(s): GLUCAP in the last 168 hours.  Iron Studies:  Recent Labs    11/07/20 1524  IRON 24*  TIBC 135*  FERRITIN 499*   Studies/Results: ECHOCARDIOGRAM COMPLETE  Result Date: 11/08/2020    ECHOCARDIOGRAM REPORT   Patient Name:   SALINDA SNEDEKER Date of Exam: 11/08/2020 Medical Rec #:  [id number]        Height:       67.0 in Accession #:    [id number]       Weight:       143.0 lb Date of Birth:  [birth year]       BSA:          1.754 m Patient Age:    56 years         BP:           163/109 mmHg Patient Gender: F                HR:           75 bpm. Exam Location:  Inpatient Procedure: 2D Echo, 3D Echo, Cardiac Doppler and Color Doppler REPORT CONTAINS CRITICAL RESULT Indications:    Anasarca  History:        Patient has prior history of Echocardiogram examinations, most                 recent 02/02/2019. Risk Factors:Hypertension. Substance abuse.                 Pulmonary edema. Renal failure. Lupus.  Sonographer:    Roseanna Rainbow RDCS Referring Phys: [id number] McDonald  1. Left ventricular ejection fraction, by estimation, is 60 to 65%. The left ventricle has normal function. The left ventricle has no regional wall motion abnormalities. There is severe left ventricular hypertrophy.  2. Right ventricular systolic function is normal. The right ventricular size is normal. Mildly increased right ventricular wall thickness. There is normal pulmonary  artery systolic pressure.  3. Large pericardial effusion surrounds heart Measures 17 mm in maximal dimension. Mitral inflow is without significant respiratory variation;  there is mild RV indentation but no RV collapse But IVC is dilated Does not meet echo criteria for tamponade CLinical correlation indicated. . Large pericardial effusion. The pericardial effusion is circumferential.  4. MR is eccentric, directed more posterior into LA. The mitral valve is abnormal. Mild mitral valve regurgitation.  5. The aortic valve is tricuspid. Aortic valve regurgitation is trivial. Mild aortic valve sclerosis is present, with no evidence of aortic valve stenosis. FINDINGS  Left Ventricle: Left ventricular ejection fraction, by estimation, is 60 to 65%. The left ventricle has normal function. The left ventricle has no regional wall motion abnormalities. The left ventricular internal cavity size was normal in size. There is  severe left ventricular hypertrophy. Right Ventricle: The right ventricular size is normal. Mildly increased right ventricular wall thickness. Right ventricular systolic function is normal. There is normal pulmonary artery systolic pressure. The tricuspid regurgitant velocity is 2.39 m/s, and with an assumed right atrial pressure of 3 mmHg, the estimated right ventricular systolic pressure is 58.0 mmHg. Left Atrium: Left atrial size was normal in size. Right Atrium: Right atrial size was normal in size. Pericardium: Large pericardial effusion surrounds heart Measures 17 mm in maximal dimension. Mitral inflow is without significant respiratory variation; there is mild RV indentation but no RV collapse But IVC is dilated Does not meet echo criteria for tamponade CLinical correlation indicated. A large pericardial effusion is present. The pericardial effusion is circumferential. Mitral Valve: MR is eccentric, directed more posterior into LA. The mitral valve is abnormal. There is mild thickening of the mitral  valve leaflet(s). Mild mitral valve regurgitation. Tricuspid Valve: The tricuspid valve is normal in structure. Tricuspid valve regurgitation is not demonstrated. Aortic Valve: The aortic valve is tricuspid. Aortic valve regurgitation is trivial. Mild aortic valve sclerosis is present, with no evidence of aortic valve stenosis. Aortic valve mean gradient measures 7.0 mmHg. Aortic valve peak gradient measures 14.9 mmHg. Aortic valve area, by VTI measures 3.44 cm. Pulmonic Valve: The pulmonic valve was grossly normal. Pulmonic valve regurgitation is trivial. Aorta: The aortic root is normal in size and structure. IAS/Shunts: The interatrial septum was not assessed. Additional Comments: Mild ascites is present.  LEFT VENTRICLE PLAX 2D LVIDd:         4.20 cm      Diastology LVIDs:         2.80 cm      LV e' medial:    13.20 cm/s LV PW:         2.10 cm      LV E/e' medial:  9.9 LV IVS:        1.50 cm      LV e' lateral:   12.10 cm/s LVOT diam:     2.10 cm      LV E/e' lateral: 10.8 LV SV:         125 LV SV Index:   71 LVOT Area:     3.46 cm  LV Volumes (MOD) LV vol d, MOD A2C: 105.0 ml LV vol d, MOD A4C: 118.0 ml LV vol s, MOD A2C: 32.1 ml LV vol s, MOD A4C: 43.8 ml LV SV MOD A2C:     72.9 ml LV SV MOD A4C:     118.0 ml LV SV MOD BP:      73.2 ml RIGHT VENTRICLE             IVC RV S prime:     17.50 cm/s  IVC diam: 2.70 cm TAPSE (M-mode):  3.7 cm LEFT ATRIUM             Index       RIGHT ATRIUM           Index LA diam:        4.00 cm 2.28 cm/m  RA Area:     21.00 cm LA Vol (A2C):   49.3 ml 28.11 ml/m RA Volume:   63.60 ml  36.27 ml/m LA Vol (A4C):   56.0 ml 31.94 ml/m LA Biplane Vol: 52.5 ml 29.94 ml/m  AORTIC VALVE                    PULMONIC VALVE AV Area (Vmax):    3.09 cm     PR End Diast Vel: 2.28 msec AV Area (Vmean):   3.18 cm AV Area (VTI):     3.44 cm AV Vmax:           193.00 cm/s AV Vmean:          121.000 cm/s AV VTI:            0.362 m AV Peak Grad:      14.9 mmHg AV Mean Grad:      7.0 mmHg LVOT  Vmax:         172.00 cm/s LVOT Vmean:        111.000 cm/s LVOT VTI:          0.360 m LVOT/AV VTI ratio: 0.99  AORTA Ao Root diam: 3.20 cm MITRAL VALVE                 TRICUSPID VALVE MV Area (PHT): 3.37 cm      TR Peak grad:   22.8 mmHg MV Decel Time: 225 msec      TR Vmax:        239.00 cm/s MR Peak grad:    113.6 mmHg MR Mean grad:    67.0 mmHg   SHUNTS MR Vmax:         533.00 cm/s Systemic VTI:  0.36 m MR Vmean:        381.0 cm/s  Systemic Diam: 2.10 cm MR PISA:         2.26 cm MR PISA Eff ROA: 16 mm MR PISA Radius:  0.60 cm MV E velocity: 131.00 cm/s MV A velocity: 103.00 cm/s MV E/A ratio:  1.27 Dorris Carnes MD Electronically signed by Dorris Carnes MD Signature Date/Time: 11/08/2020/7:14:42 PM    Final     Medications: Infusions: . sodium chloride 20 mL/hr at 11/07/20 1545  . cefTRIAXone (ROCEPHIN)  IV 1 g (11/09/20 1512)    Scheduled Medications: . sodium chloride   Intravenous Once  . amLODipine  10 mg Oral Daily  . furosemide  80 mg Intravenous Q12H  . heparin  5,000 Units Subcutaneous Q8H  . hydrocortisone cream   Topical BID  . influenza vac split quadrivalent PF  0.5 mL Intramuscular Tomorrow-1000  . labetalol  400 mg Oral BID  . sodium bicarbonate  1,300 mg Oral BID    have reviewed scheduled and prn medications.  Physical Exam: General:NAD, comfortable Heart:RRR, s1s2 nl, no rubs appreciated Lungs:clear b/l, no crackle Abdomen:soft, Non-tender, non-distended Extremities: Lower extremity edema improved Neurology: Alert, awake, nonfocal, no asterixis  Lilybelle Mayeda Tanna Furry 11/10/2020,12:21 PM  LOS: 3 days  Pager: [phone]

## 2020-11-10 NOTE — Progress Notes (Addendum)
Progress Note  Patient Name: Cathy [name] Date of Encounter: 11/10/2020  [facility] Cardiologist: No primary care provider on file.   Subjective   Denies any chest pain but had significant SOB overnight.  BP stable and not tachy.    Inpatient Medications    Scheduled Meds: . sodium chloride   Intravenous Once  . amLODipine  10 mg Oral Daily  . furosemide  60 mg Intravenous Q12H  . heparin  5,000 Units Subcutaneous Q8H  . hydrocortisone cream   Topical BID  . influenza vac split quadrivalent PF  0.5 mL Intramuscular Tomorrow-1000  . labetalol  400 mg Oral BID  . sodium bicarbonate  650 mg Oral BID   Continuous Infusions: . sodium chloride 20 mL/hr at 11/07/20 1545  . cefTRIAXone (ROCEPHIN)  IV 1 g (11/09/20 1512)   PRN Meds: acetaminophen **OR** acetaminophen, HYDROcodone-acetaminophen, morphine injection, polyethylene glycol, prochlorperazine   Vital Signs    Vitals:   11/09/20 1341 11/09/20 2100 11/10/20 0500 11/10/20 0614  BP: 130/85 (!) 141/98  (!) 157/113  Pulse: 68 74  73  Resp: 19 16  14   Temp: 97.9 F (36.6 C) 98.2 F (36.8 C)  98.2 F (36.8 C)  TempSrc: Oral Oral  Oral  SpO2: 98% 100%  99%  Weight:   72.3 kg   Height:        Intake/Output Summary (Last 24 hours) at 11/10/2020 0813 Last data filed at 11/10/2020 2035 Gross per 24 hour  Intake 1096 ml  Output 500 ml  Net 596 ml   Last 3 Weights 11/10/2020 11/09/2020 11/08/2020  Weight (lbs) 159 lb 6.3 oz 158 lb 4.6 oz 140 lb  Weight (kg) 72.3 kg 71.8 kg 63.504 kg      Telemetry    NSR - Personally Reviewed  ECG    No new EKG to review - Personally Reviewed  Physical Exam   GEN: No acute distress.   Neck: No JVD Cardiac: RRR, no murmurs, rubs, or gallops.  Respiratory: Clear to auscultation bilaterally. GI: Soft, nontender, non-distended  MS: No edema; No deformity. Neuro:  Nonfocal  Psych: Normal affect   Labs    High Sensitivity Troponin:  No results for input(s):  TROPONINIHS in the last 720 hours.    Chemistry Recent Labs  Lab 11/06/20 1759 11/07/20 1104 11/08/20 0603 11/09/20 0707 11/10/20 0657  NA 134* 133* 133* 137 135  K 5.2* 5.1 4.6 4.6 4.2  CL 106 107 108 108 109  CO2 20* 17* 16* 20* 16*  GLUCOSE 102* 99 103* 99 115*  BUN 54* 53* 46* 54* 52*  CREATININE 2.61* 2.50* 2.26* 2.21* 2.25*  CALCIUM 7.1* 6.9* 6.9* 7.0* 7.0*  PROT 5.0* 4.8*  --   --   --   ALBUMIN 1.6* 1.5* 2.3* 2.2* 2.0*  AST 22 22  --   --   --   ALT 14 14  --   --   --   ALKPHOS 84 89  --   --   --   BILITOT 0.3 0.4  --   --   --   GFRNONAA 24* 25* 28* 29* 28*  ANIONGAP 8 9 9 9 10      Hematology Recent Labs  Lab 11/08/20 0603 11/09/20 0707 11/10/20 0657  WBC 5.7 5.1 4.9  RBC 2.41* 2.89* 2.80*  HGB 6.9* 8.4* 8.0*  HCT 20.8* 25.2* 24.7*  MCV 86.3 87.2 88.2  MCH 28.6 29.1 28.6  MCHC 33.2 33.3 32.4  RDW  15.3 15.0 15.5  PLT 155 150 165    BNPNo results for input(s): BNP, PROBNP in the last 168 hours.   DDimer No results for input(s): DDIMER in the last 168 hours.    Radiology    ECHOCARDIOGRAM COMPLETE  Result Date: 11/08/2020    ECHOCARDIOGRAM REPORT   Patient Name:   [name] Date of Exam: 11/08/2020 Medical Rec #:  [id number]        Height:       67.0 in Accession #:    [id number]       Weight:       143.0 lb Date of Birth:  [birth year]       BSA:          1.754 m Patient Age:    40 years         BP:           163/109 mmHg Patient Gender: F                HR:           75 bpm. Exam Location:  Inpatient Procedure: 2D Echo, 3D Echo, Cardiac Doppler and Color Doppler REPORT CONTAINS CRITICAL RESULT Indications:    Anasarca  History:        Patient has prior history of Echocardiogram examinations, most                 recent 02/02/2019. Risk Factors:Hypertension. Substance abuse.                 Pulmonary edema. Renal failure. Lupus.  Sonographer:    Roseanna Rainbow RDCS Referring Phys: [id number] [place]  1. Left ventricular ejection fraction, by  estimation, is 60 to 65%. The left ventricle has normal function. The left ventricle has no regional wall motion abnormalities. There is severe left ventricular hypertrophy.  2. Right ventricular systolic function is normal. The right ventricular size is normal. Mildly increased right ventricular wall thickness. There is normal pulmonary artery systolic pressure.  3. Large pericardial effusion surrounds heart Measures 17 mm in maximal dimension. Mitral inflow is without significant respiratory variation; there is mild RV indentation but no RV collapse But IVC is dilated Does not meet echo criteria for tamponade CLinical correlation indicated. . Large pericardial effusion. The pericardial effusion is circumferential.  4. MR is eccentric, directed more posterior into LA. The mitral valve is abnormal. Mild mitral valve regurgitation.  5. The aortic valve is tricuspid. Aortic valve regurgitation is trivial. Mild aortic valve sclerosis is present, with no evidence of aortic valve stenosis. FINDINGS  Left Ventricle: Left ventricular ejection fraction, by estimation, is 60 to 65%. The left ventricle has normal function. The left ventricle has no regional wall motion abnormalities. The left ventricular internal cavity size was normal in size. There is  severe left ventricular hypertrophy. Right Ventricle: The right ventricular size is normal. Mildly increased right ventricular wall thickness. Right ventricular systolic function is normal. There is normal pulmonary artery systolic pressure. The tricuspid regurgitant velocity is 2.39 m/s, and with an assumed right atrial pressure of 3 mmHg, the estimated right ventricular systolic pressure is 65.0 mmHg. Left Atrium: Left atrial size was normal in size. Right Atrium: Right atrial size was normal in size. Pericardium: Large pericardial effusion surrounds heart Measures 17 mm in maximal dimension. Mitral inflow is without significant respiratory variation; there is mild RV  indentation but no RV collapse But IVC is dilated Does not meet echo criteria  for tamponade CLinical correlation indicated. A large pericardial effusion is present. The pericardial effusion is circumferential. Mitral Valve: MR is eccentric, directed more posterior into LA. The mitral valve is abnormal. There is mild thickening of the mitral valve leaflet(s). Mild mitral valve regurgitation. Tricuspid Valve: The tricuspid valve is normal in structure. Tricuspid valve regurgitation is not demonstrated. Aortic Valve: The aortic valve is tricuspid. Aortic valve regurgitation is trivial. Mild aortic valve sclerosis is present, with no evidence of aortic valve stenosis. Aortic valve mean gradient measures 7.0 mmHg. Aortic valve peak gradient measures 14.9 mmHg. Aortic valve area, by VTI measures 3.44 cm. Pulmonic Valve: The pulmonic valve was grossly normal. Pulmonic valve regurgitation is trivial. Aorta: The aortic root is normal in size and structure. IAS/Shunts: The interatrial septum was not assessed. Additional Comments: Mild ascites is present.  LEFT VENTRICLE PLAX 2D LVIDd:         4.20 cm      Diastology LVIDs:         2.80 cm      LV e' medial:    13.20 cm/s LV PW:         2.10 cm      LV E/e' medial:  9.9 LV IVS:        1.50 cm      LV e' lateral:   12.10 cm/s LVOT diam:     2.10 cm      LV E/e' lateral: 10.8 LV SV:         125 LV SV Index:   71 LVOT Area:     3.46 cm  LV Volumes (MOD) LV vol d, MOD A2C: 105.0 ml LV vol d, MOD A4C: 118.0 ml LV vol s, MOD A2C: 32.1 ml LV vol s, MOD A4C: 43.8 ml LV SV MOD A2C:     72.9 ml LV SV MOD A4C:     118.0 ml LV SV MOD BP:      73.2 ml RIGHT VENTRICLE             IVC RV S prime:     17.50 cm/s  IVC diam: 2.70 cm TAPSE (M-mode): 3.7 cm LEFT ATRIUM             Index       RIGHT ATRIUM           Index LA diam:        4.00 cm 2.28 cm/m  RA Area:     21.00 cm LA Vol (A2C):   49.3 ml 28.11 ml/m RA Volume:   63.60 ml  36.27 ml/m LA Vol (A4C):   56.0 ml 31.94 ml/m LA Biplane  Vol: 52.5 ml 29.94 ml/m  AORTIC VALVE                    PULMONIC VALVE AV Area (Vmax):    3.09 cm     PR End Diast Vel: 2.28 msec AV Area (Vmean):   3.18 cm AV Area (VTI):     3.44 cm AV Vmax:           193.00 cm/s AV Vmean:          121.000 cm/s AV VTI:            0.362 m AV Peak Grad:      14.9 mmHg AV Mean Grad:      7.0 mmHg LVOT Vmax:         172.00 cm/s LVOT Vmean:  111.000 cm/s LVOT VTI:          0.360 m LVOT/AV VTI ratio: 0.99  AORTA Ao Root diam: 3.20 cm MITRAL VALVE                 TRICUSPID VALVE MV Area (PHT): 3.37 cm      TR Peak grad:   22.8 mmHg MV Decel Time: 225 msec      TR Vmax:        239.00 cm/s MR Peak grad:    113.6 mmHg MR Mean grad:    67.0 mmHg   SHUNTS MR Vmax:         533.00 cm/s Systemic VTI:  0.36 m MR Vmean:        381.0 cm/s  Systemic Diam: 2.10 cm MR PISA:         2.26 cm MR PISA Eff ROA: 16 mm MR PISA Radius:  0.60 cm MV E velocity: 131.00 cm/s MV A velocity: 103.00 cm/s MV E/A ratio:  1.27 Dorris Carnes MD Electronically signed by Dorris Carnes MD Signature Date/Time: 11/08/2020/7:14:42 PM    Final     Cardiac Studies   Echocardiogram 11/08/20: 1. Left ventricular ejection fraction, by estimation, is 60 to 65%. The  left ventricle has normal function. The left ventricle has no regional  wall motion abnormalities. There is severe left ventricular hypertrophy.  2. Right ventricular systolic function is normal. The right ventricular  size is normal. Mildly increased right ventricular wall thickness. There  is normal pulmonary artery systolic pressure.  3. Large pericardial effusion surrounds heart Measures 17 mm in maximal  dimension. Mitral inflow is without significant respiratory variation;  there is mild RV indentation but no RV collapse But IVC is dilated Does  not meet echo criteria for tamponade  CLinical correlation indicated. . Large pericardial effusion. The  pericardial effusion is circumferential.  4. MR is eccentric, directed more posterior into  LA. The mitral valve is  abnormal. Mild mitral valve regurgitation.  5. The aortic valve is tricuspid. Aortic valve regurgitation is trivial.  Mild aortic valve sclerosis is present, with no evidence of aortic valve  stenosis.    Patient Profile     39 y.o. 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 Erlinda Hong.  Assessment & Plan    1. Pericardial effusion: patient presented with generalized swelling and other non-specific complaints. Found to have anasarca likely 2/2 volume overload in the setting of nephrotic syndrome. Nephrology following for assistance with diuresis. Echo revealed large circumferential pericardial effusion without evidence of tamponade. She has been diuresing with IV lasix 60mg  BID with albumin. -she put out 500cc yesterday and is net+ 3.6L -weight up 1lb from yesterday - No indication for urgent pericardiocentesis given lack of tamponade on echo and she remains hemodynamically stable and actually hypertensive on exam - Continue diuresis per nephrology - repeat 2D echo today since she had SOB overnight that was worse than usual  2. Chest pain/DOE: these appear to be chronic issues occurring for years. No recent change and no complaints of chest pain or SOB at rest. Risk factors for CAD include poorly controlled HTN, SLE, and evidence of aortic atherosclerosis on CT scan. Non-fasting lipids this admission with LDL 109. Echo with EF 60-65% with no RWMA, severe LVH, and  a large pericardial effusion with out tamponade.   - Favor ongoing efforts to diurese - Can consider an outpatient ischemic evaluation once volume status and HTN management stabilize.   3. HTN:  -BP poorly controlled in the setting of medication non-compliance.  -BP elevated at 157/148mmHg this am -Previously prescribed  amlodipine and labetalol restarted -Amlodipine restarted at 10mg  daily and may take a few days to take full effect -may need additional antihypertensive meds>>will get renal input -Continue current medications - further titration pending ongoing diuresis  4. Anemia: Hgb dropped to 6.2 this admission, up to 8.4 following 1 uPRBC. No complaints of bleeding. Felt to be 2/2 lupus.  - Hbg 8 this am - Continue management per primary team  5. 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  - Continue close monitoring and management per nephrology  6. 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 Lupus: diagnosed in 2019. Has not been on medications. Lupus labs pending. Needs to establish with rheumatology  - Continue management per primary team  8. UTI: klebsiella + UCx. Currently on IV antibiotics - Continue management per primary team  I have spent a total of 30 minutes with patient reviewing 2D echo , telemetry, EKGs, labs and examining patient as well as establishing an assessment and plan that was discussed with the patient.  > 50% of time was spent in direct patient care.       For questions or updates, please contact [place] Please consult [email or web address] for contact info under        Signed, Fransico Him, MD  11/10/2020, 8:13 AM

## 2020-11-10 NOTE — Progress Notes (Signed)
PROGRESS NOTE    Cathy [name]  BMW:[id number] DOB: [birth year] DOA: 11/07/2020 PCP: Patient, No Pcp Per    Chief Complaint  Patient presents with  . Dysuria  . Leg Swelling    bilateral  . Cough    Brief Narrative:  This is a 39 year old female with past medical history of bipolar depression, SLE, PID, renal hypertension, substance abuse who presented to the ED with 3 weeks of multiple nonspecific complaints most notably abdominal discomfort and distention.  Associated with lower extremity and abdominal edema.  Has a mild cough with some chest tightness associated with coughing and DOE.  Also some wheezing.  No fever or chills.  She was recently seen in the urgent care on 11/17 at which time she had similar complaints and found to have a UTI, hypertensive urgency and was referred to primary care and rheumatology.  She has not been seen by either.  States she ran out of her amlodipine 2 weeks ago and is currently not taking any medications at all  Subjective:  500cc urine output documented last 24hrs, not sure if accurate She reports remain edematous but improving, lower extremity edema has almost resolved, abdomen is softer, less tight, facial edema is improving,  Reports feel heavy in the chest,  productive cough, reports quit smoking a month ago because feeling bad Report having diarrhea every time she eat for the last 1 to 59-month, no diarrhea since admitted  chronic itchy and painful rash on buttocks and thighs, underwent biopsy last year, but did not follow up on result No fever  She is a poor historian, she states no medical follow ups after lost her insurance   Assessment & Plan:   Principal Problem:   Anasarca Active Problems:   Lupus ([place])   Acute renal failure (ARF) ([place])   Anemia   Aortic atherosclerosis ([facility])   Abdominal pain   Diarrhea   AKI with generalized edema, hypoalbumin anemia, proteinuria with history of uncontrolled lupus -Urine culture greater  than 100,000 Klebsiella pneumoniae, currently on Rocephin -Nephrology consulted, currently on IV Lasix , IV albumin infusion -defer renal biopsy and management of lupus nephritis to nephrology -Nephrology input appreciated  Metabolic acidosis, bicarb supplement  Hyponatremia, likely from volume overload, improved on IV Lasix   Acute on chronic normocytic anemia with baseline anemia from chronic disease -Currently does not appear to have gross blood loss -Less likely hemolysis due to normal total bilirubin -Suspect due to hemodilution from  Edema,  S/p 1 units PRBC -Check Fobt  Anasarca Marked gastric fold thickening may be due to hyperproteinemia or severe anasarca. Correlate with symptoms of gastritis and consider endoscopic assessment for further evaluation. Haustral thickening of the colon also may be related to anasarca, correlate with any symptoms of colitis. Would also consider correlation with lactate though again findings are diffuse, generalized in seen in the setting of anasarca. Currently getting IV albumin and IV Lasix  Large pericardioeffusion No sign of tamponade Cardiology consulted, repeat echocardiogram on Monday, will follow recommendation  Hypertension -Noncompliance with medication -Continue Norvasc and labetalol, IV Lasix   Systemic Lupus Per chart review was on hydroxychloroquine and prednisone Does not appear to be compliance with medication Does not appear to follow with rheumatology regularly Case discussed with rheumatology Dr. Benjamine Mola who agreed with steroid treatment and outpatient follow-up  chronic itchy and painful rash on buttocks and thighs, underwent biopsy last year, but did not follow up on result Topical steroid provided  Lymphadenopathy on CT abdomen  pelvis  Aortic atherosclerosis, hyperlipidemia, will discuss statin with patient, however compliance is an issue  She reported lost insurance, case manager consulted   DVT prophylaxis:  heparin injection 5,000 Units Start: 11/07/20 1630   Code Status: Full Family Communication: Patient Disposition:   Status is: Inpatient  Dispo: The patient is from: Home              Anticipated d/c is to: Home              Anticipated d/c date is:               Patient currently not medically ready to discharge  Consultants:   Nephrology  Cardiology  Rheumatology  Procedures:   PRBC transfusion x1  Antimicrobials:   Rocephin     Objective: Vitals:   11/09/20 1341 11/09/20 2100 11/10/20 0500 11/10/20 0614  BP: 130/85 (!) 141/98  (!) 157/113  Pulse: 68 74  73  Resp: 19 16  14   Temp: 97.9 F (36.6 C) 98.2 F (36.8 C)  98.2 F (36.8 C)  TempSrc: Oral Oral  Oral  SpO2: 98% 100%  99%  Weight:   72.3 kg   Height:        Intake/Output Summary (Last 24 hours) at 11/10/2020 1238 Last data filed at 11/10/2020 4034 Gross per 24 hour  Intake 936 ml  Output 500 ml  Net 436 ml   Filed Weights   11/08/20 1543 11/09/20 0546 11/10/20 0500  Weight: 63.5 kg 71.8 kg 72.3 kg    Examination:  General exam: Generalized edema, NAD Respiratory system: Diminished at bases, Respiratory effort normal. Cardiovascular system: S1 & S2 heard, RRR. No JVD, no murmur, No pedal edema. Gastrointestinal system: Protuberant abdomen , soft and nontender. Normal bowel sounds heard. Central nervous system: Alert and oriented. No focal neurological deficits. Extremities: Symmetric 5 x 5 power. Skin: Chronic rash bilateral buttock and thighs, no drainage, no open wound, does not appear infected Psychiatry: Calm and cooperative, but poor historian    Data Reviewed: I have personally reviewed following labs and imaging studies  CBC: Recent Labs  Lab 11/06/20 1759 11/07/20 1104 11/07/20 2245 11/08/20 0603 11/09/20 0707 11/10/20 0657  WBC 7.7 7.2  --  5.7 5.1 4.9  NEUTROABS  --  5.3  --   --   --   --   HGB 7.5* 7.3* 6.2* 6.9* 8.4* 8.0*  HCT 22.9* 22.6* 18.6* 20.8* 25.2*  24.7*  MCV 87.7 85.6  --  86.3 87.2 88.2  PLT 147* 171  --  155 150 742    Basic Metabolic Panel: Recent Labs  Lab 11/06/20 1759 11/07/20 1104 11/08/20 0603 11/09/20 0707 11/10/20 0657  NA 134* 133* 133* 137 135  K 5.2* 5.1 4.6 4.6 4.2  CL 106 107 108 108 109  CO2 20* 17* 16* 20* 16*  GLUCOSE 102* 99 103* 99 115*  BUN 54* 53* 46* 54* 52*  CREATININE 2.61* 2.50* 2.26* 2.21* 2.25*  CALCIUM 7.1* 6.9* 6.9* 7.0* 7.0*  PHOS  --   --   --  6.2* 6.2*    GFR: Estimated Creatinine Clearance: 33.3 mL/min (A) (by C-G formula based on SCr of 2.25 mg/dL (H)).  Liver Function Tests: Recent Labs  Lab 11/06/20 1759 11/07/20 1104 11/08/20 0603 11/09/20 0707 11/10/20 0657  AST 22 22  --   --   --   ALT 14 14  --   --   --   ALKPHOS 84 89  --   --   --  BILITOT 0.3 0.4  --   --   --   PROT 5.0* 4.8*  --   --   --   ALBUMIN 1.6* 1.5* 2.3* 2.2* 2.0*    CBG: No results for input(s): GLUCAP in the last 168 hours.   Recent Results (from the past 240 hour(s))  Resp Panel by RT-PCR (Flu A&B, Covid) Nasopharyngeal Swab     Status: None   Collection Time: 11/07/20 11:06 AM   Specimen: Nasopharyngeal Swab; Nasopharyngeal(NP) swabs in vial transport medium  Result Value Ref Range Status   SARS Coronavirus 2 by RT PCR NEGATIVE NEGATIVE Final    Comment: (NOTE) SARS-CoV-2 target nucleic acids are NOT DETECTED.  The SARS-CoV-2 RNA is generally detectable in upper respiratory specimens during the acute phase of infection. The lowest concentration of SARS-CoV-2 viral copies this assay can detect is 138 copies/mL. A negative result does not preclude SARS-Cov-2 infection and should not be used as the sole basis for treatment or other patient management decisions. A negative result may occur with  improper specimen collection/handling, submission of specimen other than nasopharyngeal swab, presence of viral mutation(s) within the areas targeted by this assay, and inadequate number of  viral copies(<138 copies/mL). A negative result must be combined with clinical observations, patient history, and epidemiological information. The expected result is Negative.  Fact Sheet for Patients:  EntrepreneurPulse.com.au  Fact Sheet for Healthcare Providers:  IncredibleEmployment.be  This test is no t yet approved or cleared by the [place] FDA and  has been authorized for detection and/or diagnosis of SARS-CoV-2 by FDA under an Emergency Use Authorization (EUA). This EUA will remain  in effect (meaning this test can be used) for the duration of the COVID-19 declaration under Section 564(b)(1) of the Act, 21 U.S.C.section 360bbb-3(b)(1), unless the authorization is terminated  or revoked sooner.       Influenza A by PCR NEGATIVE NEGATIVE Final   Influenza B by PCR NEGATIVE NEGATIVE Final    Comment: (NOTE) The Xpert Xpress SARS-CoV-2/FLU/RSV plus assay is intended as an aid in the diagnosis of influenza from Nasopharyngeal swab specimens and should not be used as a sole basis for treatment. Nasal washings and aspirates are unacceptable for Xpert Xpress SARS-CoV-2/FLU/RSV testing.  Fact Sheet for Patients: EntrepreneurPulse.com.au  Fact Sheet for Healthcare Providers: IncredibleEmployment.be  This test is not yet approved or cleared by the [place] FDA and has been authorized for detection and/or diagnosis of SARS-CoV-2 by FDA under an Emergency Use Authorization (EUA). This EUA will remain in effect (meaning this test can be used) for the duration of the COVID-19 declaration under Section 564(b)(1) of the Act, 21 U.S.C. section 360bbb-3(b)(1), unless the authorization is terminated or revoked.  Performed at [facility], [place] [address] [place], [place] [address]   Urine Culture     Status: Abnormal   Collection Time: 11/07/20 12:49 PM   Specimen: Urine, Clean  Catch  Result Value Ref Range Status   Specimen Description   Final    URINE, CLEAN CATCH Performed at [facility], [place] [address], [place], [place] [address]    Special Requests   Final    NONE Performed at [facility], [place] [address], [place],  [address]    Culture >=100,000 COLONIES/mL KLEBSIELLA PNEUMONIAE (A)  Final   Report Status 11/09/2020 FINAL  Final   Organism ID, Bacteria KLEBSIELLA PNEUMONIAE (A)  Final      Susceptibility   Klebsiella pneumoniae -  MIC*    AMPICILLIN RESISTANT Resistant     CEFAZOLIN <=4 SENSITIVE Sensitive     CEFEPIME <=0.12 SENSITIVE Sensitive     CEFTRIAXONE <=0.25 SENSITIVE Sensitive     CIPROFLOXACIN <=0.25 SENSITIVE Sensitive     GENTAMICIN <=1 SENSITIVE Sensitive     IMIPENEM <=0.25 SENSITIVE Sensitive     NITROFURANTOIN 128 RESISTANT Resistant     TRIMETH/SULFA <=20 SENSITIVE Sensitive     AMPICILLIN/SULBACTAM <=2 SENSITIVE Sensitive     PIP/TAZO <=4 SENSITIVE Sensitive     * >=100,000 COLONIES/mL KLEBSIELLA PNEUMONIAE  MRSA PCR Screening     Status: None   Collection Time: 11/09/20 11:40 AM   Specimen: Nasal Mucosa; Nasopharyngeal  Result Value Ref Range Status   MRSA by PCR NEGATIVE NEGATIVE Final    Comment:        The GeneXpert MRSA Assay (FDA approved for NASAL specimens only), is one component of a comprehensive MRSA colonization surveillance program. It is not intended to diagnose MRSA infection nor to guide or monitor treatment for MRSA infections. Performed at [facility], [place] [address], [place], [place] [address]          Radiology Studies: ECHOCARDIOGRAM COMPLETE  Result Date: 11/08/2020    ECHOCARDIOGRAM REPORT   Patient Name:   Cathy [name] Date of Exam: 11/08/2020 Medical Rec #:  [id number]        Height:       67.0 in Accession #:    [id number]       Weight:       143.0 lb Date of Birth:  06-Jul-1981       BSA:          1.754 m Patient Age:     76 years         BP:           163/109 mmHg Patient Gender: F                HR:           75 bpm. Exam Location:  Inpatient Procedure: 2D Echo, 3D Echo, Cardiac Doppler and Color Doppler REPORT CONTAINS CRITICAL RESULT Indications:    Anasarca  History:        Patient has prior history of Echocardiogram examinations, most                 recent 02/02/2019. Risk Factors:Hypertension. Substance abuse.                 Pulmonary edema. Renal failure. Lupus.  Sonographer:    Roseanna Rainbow RDCS Referring Phys: [id number] [place]  1. Left ventricular ejection fraction, by estimation, is 60 to 65%. The left ventricle has normal function. The left ventricle has no regional wall motion abnormalities. There is severe left ventricular hypertrophy.  2. Right ventricular systolic function is normal. The right ventricular size is normal. Mildly increased right ventricular wall thickness. There is normal pulmonary artery systolic pressure.  3. Large pericardial effusion surrounds heart Measures 17 mm in maximal dimension. Mitral inflow is without significant respiratory variation; there is mild RV indentation but no RV collapse But IVC is dilated Does not meet echo criteria for tamponade CLinical correlation indicated. . Large pericardial effusion. The pericardial effusion is circumferential.  4. MR is eccentric, directed more posterior into LA. The mitral valve is abnormal. Mild mitral valve regurgitation.  5. The aortic valve is tricuspid. Aortic valve regurgitation is trivial. Mild aortic valve sclerosis is  present, with no evidence of aortic valve stenosis. FINDINGS  Left Ventricle: Left ventricular ejection fraction, by estimation, is 60 to 65%. The left ventricle has normal function. The left ventricle has no regional wall motion abnormalities. The left ventricular internal cavity size was normal in size. There is  severe left ventricular hypertrophy. Right Ventricle: The right ventricular size is normal. Mildly  increased right ventricular wall thickness. Right ventricular systolic function is normal. There is normal pulmonary artery systolic pressure. The tricuspid regurgitant velocity is 2.39 m/s, and with an assumed right atrial pressure of 3 mmHg, the estimated right ventricular systolic pressure is 06.2 mmHg. Left Atrium: Left atrial size was normal in size. Right Atrium: Right atrial size was normal in size. Pericardium: Large pericardial effusion surrounds heart Measures 17 mm in maximal dimension. Mitral inflow is without significant respiratory variation; there is mild RV indentation but no RV collapse But IVC is dilated Does not meet echo criteria for tamponade CLinical correlation indicated. A large pericardial effusion is present. The pericardial effusion is circumferential. Mitral Valve: MR is eccentric, directed more posterior into LA. The mitral valve is abnormal. There is mild thickening of the mitral valve leaflet(s). Mild mitral valve regurgitation. Tricuspid Valve: The tricuspid valve is normal in structure. Tricuspid valve regurgitation is not demonstrated. Aortic Valve: The aortic valve is tricuspid. Aortic valve regurgitation is trivial. Mild aortic valve sclerosis is present, with no evidence of aortic valve stenosis. Aortic valve mean gradient measures 7.0 mmHg. Aortic valve peak gradient measures 14.9 mmHg. Aortic valve area, by VTI measures 3.44 cm. Pulmonic Valve: The pulmonic valve was grossly normal. Pulmonic valve regurgitation is trivial. Aorta: The aortic root is normal in size and structure. IAS/Shunts: The interatrial septum was not assessed. Additional Comments: Mild ascites is present.  LEFT VENTRICLE PLAX 2D LVIDd:         4.20 cm      Diastology LVIDs:         2.80 cm      LV e' medial:    13.20 cm/s LV PW:         2.10 cm      LV E/e' medial:  9.9 LV IVS:        1.50 cm      LV e' lateral:   12.10 cm/s LVOT diam:     2.10 cm      LV E/e' lateral: 10.8 LV SV:         125 LV SV Index:    71 LVOT Area:     3.46 cm  LV Volumes (MOD) LV vol d, MOD A2C: 105.0 ml LV vol d, MOD A4C: 118.0 ml LV vol s, MOD A2C: 32.1 ml LV vol s, MOD A4C: 43.8 ml LV SV MOD A2C:     72.9 ml LV SV MOD A4C:     118.0 ml LV SV MOD BP:      73.2 ml RIGHT VENTRICLE             IVC RV S prime:     17.50 cm/s  IVC diam: 2.70 cm TAPSE (M-mode): 3.7 cm LEFT ATRIUM             Index       RIGHT ATRIUM           Index LA diam:        4.00 cm 2.28 cm/m  RA Area:     21.00 cm LA Vol (A2C):   49.3 ml 28.11 ml/m  RA Volume:   63.60 ml  36.27 ml/m LA Vol (A4C):   56.0 ml 31.94 ml/m LA Biplane Vol: 52.5 ml 29.94 ml/m  AORTIC VALVE                    PULMONIC VALVE AV Area (Vmax):    3.09 cm     PR End Diast Vel: 2.28 msec AV Area (Vmean):   3.18 cm AV Area (VTI):     3.44 cm AV Vmax:           193.00 cm/s AV Vmean:          121.000 cm/s AV VTI:            0.362 m AV Peak Grad:      14.9 mmHg AV Mean Grad:      7.0 mmHg LVOT Vmax:         172.00 cm/s LVOT Vmean:        111.000 cm/s LVOT VTI:          0.360 m LVOT/AV VTI ratio: 0.99  AORTA Ao Root diam: 3.20 cm MITRAL VALVE                 TRICUSPID VALVE MV Area (PHT): 3.37 cm      TR Peak grad:   22.8 mmHg MV Decel Time: 225 msec      TR Vmax:        239.00 cm/s MR Peak grad:    113.6 mmHg MR Mean grad:    67.0 mmHg   SHUNTS MR Vmax:         533.00 cm/s Systemic VTI:  0.36 m MR Vmean:        381.0 cm/s  Systemic Diam: 2.10 cm MR PISA:         2.26 cm MR PISA Eff ROA: 16 mm MR PISA Radius:  0.60 cm MV E velocity: 131.00 cm/s MV A velocity: 103.00 cm/s MV E/A ratio:  1.27 Dorris Carnes MD Electronically signed by Dorris Carnes MD Signature Date/Time: 11/08/2020/7:14:42 PM    Final         Scheduled Meds: . sodium chloride   Intravenous Once  . amLODipine  10 mg Oral Daily  . famotidine  10 mg Oral Daily  . furosemide  80 mg Intravenous Q12H  . heparin  5,000 Units Subcutaneous Q8H  . hydrocortisone cream   Topical BID  . influenza vac split quadrivalent PF  0.5 mL  Intramuscular Tomorrow-1000  . labetalol  400 mg Oral BID  . methylPREDNISolone (SOLU-MEDROL) injection  125 mg Intravenous Once  . [START ON 11/11/2020] predniSONE  50 mg Oral Q breakfast  . sodium bicarbonate  1,300 mg Oral BID   Continuous Infusions: . sodium chloride 20 mL/hr at 11/07/20 1545  . albumin human    . cefTRIAXone (ROCEPHIN)  IV 1 g (11/09/20 1512)     LOS: 3 days   Time spent: 12mins, case discussed with nephrology Greater than 50% of this time was spent in counseling, explanation of diagnosis, planning of further management, and coordination of care.  I have personally reviewed and interpreted on  11/10/2020 daily labs, I reviewed all nursing notes, pharmacy notes, consultant notes,  vitals, pertinent old records  I have discussed plan of care as described above with RN , patient  on 11/10/2020  Voice Recognition /Dragon dictation system was used to create this note, attempts have been made to correct errors. Please contact the author with questions  and/or clarifications.   Florencia Reasons, MD PhD FACP [facility]  Available via Epic secure chat 7am-7pm for nonurgent issues Please page for urgent issues To page the attending provider between 7A-7P or the covering provider during after hours 7P-7A, please log into the web site [email or web address] and access using universal [place] password for that web site. If you do not have the password, please call the hospital operator.    11/10/2020, 12:38 PM

## 2020-11-11 LAB — RENAL FUNCTION PANEL
Albumin: 3 g/dL — ABNORMAL LOW (ref 3.5–5.0)
Anion gap: 12 (ref 5–15)
BUN: 65 mg/dL — ABNORMAL HIGH (ref 6–20)
CO2: 16 mmol/L — ABNORMAL LOW (ref 22–32)
Calcium: 7.6 mg/dL — ABNORMAL LOW (ref 8.9–10.3)
Chloride: 108 mmol/L (ref 98–111)
Creatinine, Ser: 2.64 mg/dL — ABNORMAL HIGH (ref 0.44–1.00)
GFR, Estimated: 23 mL/min — ABNORMAL LOW (ref >60)
Glucose, Bld: 154 mg/dL — ABNORMAL HIGH (ref 70–99)
Phosphorus: 6.5 mg/dL — ABNORMAL HIGH (ref 2.5–4.6)
Potassium: 4.8 mmol/L (ref 3.5–5.1)
Sodium: 136 mmol/L (ref 135–145)

## 2020-11-11 LAB — CBC
HCT: 23.7 % — ABNORMAL LOW (ref 36.0–46.0)
Hemoglobin: 7.9 g/dL — ABNORMAL LOW (ref 12.0–15.0)
MCH: 28.8 pg (ref 26.0–34.0)
MCHC: 33.3 g/dL (ref 30.0–36.0)
MCV: 86.5 fL (ref 80.0–100.0)
Platelets: 163 10*3/uL (ref 150–400)
RBC: 2.74 MIL/uL — ABNORMAL LOW (ref 3.87–5.11)
RDW: 15.4 % (ref 11.5–15.5)
WBC: 3.6 10*3/uL — ABNORMAL LOW (ref 4.0–10.5)
nRBC: 0 % (ref 0.0–0.2)

## 2020-11-11 LAB — GASTROINTESTINAL PANEL BY PCR, STOOL (REPLACES STOOL CULTURE)

## 2020-11-11 MED ORDER — CEPHALEXIN 500 MG PO CAPS
500.0000 mg | ORAL_CAPSULE | Freq: Three times a day (TID) | ORAL | Status: DC
  Administered 2020-11-11 – 2020-11-13 (×6): 500 mg via ORAL
  Filled 2020-11-11 (×6): qty 1

## 2020-11-11 MED ORDER — DILTIAZEM HCL 30 MG PO TABS
30.0000 mg | ORAL_TABLET | Freq: Two times a day (BID) | ORAL | Status: DC
  Administered 2020-11-11 – 2020-11-13 (×4): 30 mg via ORAL
  Filled 2020-11-11 (×4): qty 1

## 2020-11-11 NOTE — Progress Notes (Signed)
[facility] NEPHROLOGY PROGRESS NOTE  Assessment/ Plan: Pt is a 39 y.o. yo female  with history of hypertension, bipolar disorder, anxiety, diagnosed with lupus nephritis in 2019 when she was pregnant with AKI, nonadherence with outpatient follow-up, presented with generalized body pain, anasarca seen as a consultation for the evaluation of worsening renal failure and fluid overload.  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  She has biopsy-proven class III lupus nephritis in 2019.  She lost follow-up and did not take her medication.   UA with UTI, proteinuria, 7.4 g.  CT scan of abdomen pelvis ruled out obstruction.  She probably has progression of her nephritis and a lot of chronic changes. Ideally, we need to do kidney biopsy to evaluate the lupus nephritis and chronicity. However, given UTI and current infection unable to do kidney biopsy. She is also noncompliant with outpatient follow-ups and treatments.  The edema is much better with IV Lasix and albumin.  However, the creatinine level elevated to 2.64 today therefore I will hold further diuretics. May be able to start ACE inhibitor or ARB when she has a stable renal function.  May consider kidney biopsy after completion of antibiotics. She has bilateral tubal ligation for family-planning.   Avoid hypotension, NSAIDs, IV contrast.  #History of lupus nephritis class III: She had kidney biopsy in 08/2018 for nephrotic syndrome and AKI which showed focal lupus nephritis mildly active and minimally sclerosing phase.  She was not compliant with outpatient treatment and follow-up.  She was never seen by rheumatologist.  Noted complements level are low and elevated dsDNA antibody level. Also with pericardial effusion ?  Pericarditis.  Started steroid on 12/11 for general lupus flare.  She feels much better.  Denies less pain.  Continue  prednisone 50 mg and taper gradually.  I strongly recommend her to follow-up with rheumatologist after discharge.  #Hypertension/volume: Blood pressure medication including labetalol, amlodipine recently started.  Diuretics increased as above.  Monitor blood pressure.  #Metabolic acidosis: Increased sodium bicarbonate dose.  #Anemia, probably due to lupus: Iron saturation 18% with high ferritin. Received blood transfusion.  No sign of bleeding.  #Abdomen pain/anasarca/nephrotic syndrome: Echo with pericardial effusion seen by cardiology.  Treated with diuretics, repeating echo by cardiology today.  May need pericardiocentesis.    Discussed with primary team.  Subjective: Seen and examined at bedside.  Feels much better today.  Urine output around 1 L recorded.  Denies nausea vomiting chest pain or shortness of breath.  Asking when she can go home. Objective Vital signs in last 24 hours: Vitals:   11/10/20 0614 11/10/20 1456 11/10/20 2038 11/11/20 0554  BP: (!) 157/113 (!) 138/95 (!) 148/106 (!) 163/111  Pulse: 73 73 77 71  Resp: 14 (!) 21 16 16   Temp: 98.2 F (36.8 C) 99.1 F (37.3 C) 98.4 F (36.9 C) 97.9 F (36.6 C)  TempSrc: Oral Oral Oral Oral  SpO2: 99% 99% 98% 99%  Weight:      Height:       Weight change:   Intake/Output Summary (Last 24 hours) at 11/11/2020 1134 Last data filed at 11/11/2020 0755 Gross per 24 hour  Intake 1860 ml  Output 1300 ml  Net 560 ml       Labs: Basic Metabolic Panel: Recent Labs  Lab 11/09/20 0707 11/10/20 0657 11/11/20 0549  NA 137 135 136  K 4.6 4.2 4.8  CL 108 109 108  CO2 20* 16* 16*  GLUCOSE 99 115* 154*  BUN 54* 52* 65*  CREATININE 2.21* 2.25* 2.64*  CALCIUM 7.0* 7.0* 7.6*  PHOS 6.2* 6.2* 6.5*   Liver Function Tests: Recent Labs  Lab 11/06/20 1759 11/07/20 1104 11/08/20 0603 11/09/20 0707 11/10/20 0657 11/11/20 0549  AST 22 22  --   --   --   --   ALT 14 14  --   --   --   --   ALKPHOS 84 89  --   --   --    --   BILITOT 0.3 0.4  --   --   --   --   PROT 5.0* 4.8*  --   --   --   --   ALBUMIN 1.6* 1.5*   < > 2.2* 2.0* 3.0*   < > = values in this interval not displayed.   Recent Labs  Lab 11/06/20 1759 11/07/20 1104  LIPASE 54* 51   No results for input(s): AMMONIA in the last 168 hours. CBC: Recent Labs  Lab 11/07/20 1104 11/07/20 2245 11/08/20 0603 11/09/20 0707 11/10/20 0657 11/11/20 0549  WBC 7.2  --  5.7 5.1 4.9 3.6*  NEUTROABS 5.3  --   --   --   --   --   HGB 7.3*   < > 6.9* 8.4* 8.0* 7.9*  HCT 22.6*   < > 20.8* 25.2* 24.7* 23.7*  MCV 85.6  --  86.3 87.2 88.2 86.5  PLT 171  --  155 150 165 163   < > = values in this interval not displayed.   Cardiac Enzymes: No results for input(s): CKTOTAL, CKMB, CKMBINDEX, TROPONINI in the last 168 hours. CBG: No results for input(s): GLUCAP in the last 168 hours.  Iron Studies:  No results for input(s): IRON, TIBC, TRANSFERRIN, FERRITIN in the last 72 hours. Studies/Results: ECHOCARDIOGRAM LIMITED  Result Date: 11/10/2020    ECHOCARDIOGRAM LIMITED REPORT   Patient Name:   Cathy [name] Date of Exam: 11/10/2020 Medical Rec #:  [id number]        Height:       67.0 in Accession #:    [id number]       Weight:       159.4 lb Date of Birth:  [birth year]       BSA:          1.836 m Patient Age:    39 years         BP:           157/113 mmHg Patient Gender: F                HR:           70 bpm. Exam Location:  Inpatient Procedure: Limited Echo, Limited Color Doppler and Cardiac Doppler Indications:    Pericardial effusion 423.9 / I31.3  History:        Patient has prior history of Echocardiogram examinations, most                 recent 11/08/2020. Risk Factors:Hypertension.  Sonographer:    Bernadene Person RDCS Referring Phys: [place]  1. Left ventricular ejection fraction, by estimation, is 60 to 65%. The left ventricle has normal function. The left ventricle has no regional wall motion abnormalities. There is moderate  left ventricular hypertrophy.  2. Right ventricular systolic function is mildly reduced. The right ventricular size is mildly enlarged. There is normal pulmonary  artery systolic pressure.  3. Large circumferential effusion persists since echo done 11/08/20 IVC less dilated and actually decreases with respiration but mild decriment in mitral inflows with inspiration No RV/RA collapse in diastole Clinicial correlation suggested with possible  need for right heart cath and diagnostic pericardiocentesis given patients diagnosis of Lupus as well as persistence of effusion . Large pericardial effusion. The pericardial effusion is circumferential.  4. The mitral valve is abnormal. Mild mitral valve regurgitation. No evidence of mitral stenosis.  5. The aortic valve is normal in structure. Aortic valve regurgitation is not visualized. No aortic stenosis is present.  6. The inferior vena cava is dilated in size with >50% respiratory variability, suggesting right atrial pressure of 8 mmHg. FINDINGS  Left Ventricle: Left ventricular ejection fraction, by estimation, is 60 to 65%. The left ventricle has normal function. The left ventricle has no regional wall motion abnormalities. The left ventricular internal cavity size was normal in size. There is  moderate left ventricular hypertrophy. Right Ventricle: The right ventricular size is mildly enlarged. Right vetricular wall thickness was not assessed. Right ventricular systolic function is mildly reduced. There is normal pulmonary artery systolic pressure. The tricuspid regurgitant velocity is 2.15 m/s, and with an assumed right atrial pressure of 3 mmHg, the estimated right ventricular systolic pressure is 46.9 mmHg. Left Atrium: Left atrial size was normal in size. Right Atrium: Right atrial size was normal in size. Pericardium: Large circumferential effusion persists since echo done 11/08/20 IVC less dilated and actually decreases with respiration but mild decriment in mitral  inflows with inspiration No RV/RA collapse in diastole Clinicial correlation suggested with  possible need for right heart cath and diagnostic pericardiocentesis given patients diagnosis of Lupus as well as persistence of effusion. A large pericardial effusion is present. The pericardial effusion is circumferential. Mitral Valve: The mitral valve is abnormal. There is moderate thickening of the mitral valve leaflet(s). There is mild calcification of the mitral valve leaflet(s). Mild mitral valve regurgitation. No evidence of mitral valve stenosis. Tricuspid Valve: The tricuspid valve is normal in structure. Tricuspid valve regurgitation is mild . No evidence of tricuspid stenosis. Aortic Valve: The aortic valve is normal in structure. Aortic valve regurgitation is not visualized. No aortic stenosis is present. Pulmonic Valve: The pulmonic valve was normal in structure. Pulmonic valve regurgitation is not visualized. No evidence of pulmonic stenosis. Aorta: The aortic root is normal in size and structure. Venous: The inferior vena cava is dilated in size with greater than 50% respiratory variability, suggesting right atrial pressure of 8 mmHg. IAS/Shunts: No atrial level shunt detected by color flow Doppler. LEFT VENTRICLE PLAX 2D LVIDd:         5.00 cm LVIDs:         2.90 cm LV PW:         1.60 cm LV IVS:        1.20 cm LVOT diam:     1.70 cm LVOT Area:     2.27 cm  LEFT ATRIUM         Index LA diam:    3.50 cm 1.91 cm/m   AORTA Ao Root diam: 2.70 cm Ao Asc diam:  2.70 cm TRICUSPID VALVE TR Peak grad:   18.5 mmHg TR Vmax:        215.00 cm/s  SHUNTS Systemic Diam: 1.70 cm Jenkins Rouge MD Electronically signed by Jenkins Rouge MD Signature Date/Time: 11/10/2020/2:12:01 PM    Final     Medications: Infusions: . sodium  chloride 20 mL/hr at 11/07/20 1545  . cefTRIAXone (ROCEPHIN)  IV 1 g (11/10/20 1558)    Scheduled Medications: . sodium chloride   Intravenous Once  . amLODipine  10 mg Oral Daily  .  famotidine  10 mg Oral Daily  . guaiFENesin  1,200 mg Oral BID  . heparin  5,000 Units Subcutaneous Q8H  . hydrocortisone cream   Topical BID  . influenza vac split quadrivalent PF  0.5 mL Intramuscular Tomorrow-1000  . labetalol  400 mg Oral BID  . predniSONE  50 mg Oral Q breakfast  . sodium bicarbonate  1,300 mg Oral BID    have reviewed scheduled and prn medications.  Physical Exam: General:NAD, comfortable Heart:RRR, s1s2 nl, no rubs appreciated Lungs:clear b/l, no crackle Abdomen:soft, Non-tender, non-distended Extremities: Lower extremity edema improved. Neurology: Alert, awake, nonfocal, no asterixis  Cj Beecher Prasad Jaydee Conran 11/11/2020,11:34 AM  LOS: 4 days  Pager: [phone]

## 2020-11-11 NOTE — Progress Notes (Signed)
Progress Note  Patient Name: Cathy [name] Date of Encounter: 11/11/2020  [facility] Cardiologist: No primary care provider on file.   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 No further SOB and no CP.  Inpatient Medications    Scheduled Meds: . sodium chloride   Intravenous Once  . amLODipine  10 mg Oral Daily  . famotidine  10 mg Oral Daily  . furosemide  80 mg Intravenous Q12H  . guaiFENesin  1,200 mg Oral BID  . heparin  5,000 Units Subcutaneous Q8H  . hydrocortisone cream   Topical BID  . influenza vac split quadrivalent PF  0.5 mL Intramuscular Tomorrow-1000  . labetalol  400 mg Oral BID  . predniSONE  50 mg Oral Q breakfast  . sodium bicarbonate  1,300 mg Oral BID   Continuous Infusions: . sodium chloride 20 mL/hr at 11/07/20 1545  . cefTRIAXone (ROCEPHIN)  IV 1 g (11/10/20 1558)   PRN Meds: acetaminophen **OR** acetaminophen, HYDROcodone-acetaminophen, menthol-cetylpyridinium, morphine injection, polyethylene glycol, prochlorperazine   Vital Signs    Vitals:   11/10/20 0614 11/10/20 1456 11/10/20 2038 11/11/20 0554  BP: (!) 157/113 (!) 138/95 (!) 148/106 (!) 163/111  Pulse: 73 73 77 71  Resp: 14 (!) 21 16 16   Temp: 98.2 F (36.8 C) 99.1 F (37.3 C) 98.4 F (36.9 C) 97.9 F (36.6 C)  TempSrc: Oral Oral Oral Oral  SpO2: 99% 99% 98% 99%  Weight:      Height:        Intake/Output Summary (Last 24 hours) at 11/11/2020 2956 Last data filed at 11/11/2020 0755 Gross per 24 hour  Intake 1860 ml  Output 1300 ml  Net 560 ml   Last 3 Weights 11/10/2020 11/09/2020 11/08/2020  Weight (lbs) 159 lb 6.3 oz 158 lb 4.6 oz 140 lb  Weight (kg) 72.3 kg 71.8 kg 63.504 kg      Telemetry    NSR - Personally Reviewed  ECG    No new EKG to review - Personally Reviewed  Physical Exam   GEN: Well nourished, well developed in no acute distress HEENT: Normal NECK: No JVD; No carotid bruits LYMPHATICS: No  lymphadenopathy CARDIAC:RRR, no murmurs, rubs, gallops RESPIRATORY:  Clear to auscultation without rales, wheezing or rhonchi  ABDOMEN: Soft, non-tender, non-distended MUSCULOSKELETAL:  No edema; No deformity  SKIN: Warm and dry NEUROLOGIC:  Alert and oriented x 3 PSYCHIATRIC:  Normal affect    Labs    High Sensitivity Troponin:  No results for input(s): TROPONINIHS in the last 720 hours.    Chemistry Recent Labs  Lab 11/06/20 1759 11/07/20 1104 11/08/20 0603 11/09/20 0707 11/10/20 0657 11/11/20 0549  NA 134* 133*   < > 137 135 136  K 5.2* 5.1   < > 4.6 4.2 4.8  CL 106 107   < > 108 109 108  CO2 20* 17*   < > 20* 16* 16*  GLUCOSE 102* 99   < > 99 115* 154*  BUN 54* 53*   < > 54* 52* 65*  CREATININE 2.61* 2.50*   < > 2.21* 2.25* 2.64*  CALCIUM 7.1* 6.9*   < > 7.0* 7.0* 7.6*  PROT 5.0* 4.8*  --   --   --   --   ALBUMIN 1.6* 1.5*   < > 2.2* 2.0* 3.0*  AST 22 22  --   --   --   --   ALT 14 14  --   --   --   --  ALKPHOS 84 89  --   --   --   --   BILITOT 0.3 0.4  --   --   --   --   GFRNONAA 24* 25*   < > 29* 28* 23*  ANIONGAP 8 9   < > 9 10 12    < > = values in this interval not displayed.     Hematology Recent Labs  Lab 11/09/20 0707 11/10/20 0657 11/11/20 0549  WBC 5.1 4.9 3.6*  RBC 2.89* 2.80* 2.74*  HGB 8.4* 8.0* 7.9*  HCT 25.2* 24.7* 23.7*  MCV 87.2 88.2 86.5  MCH 29.1 28.6 28.8  MCHC 33.3 32.4 33.3  RDW 15.0 15.5 15.4  PLT 150 165 163    BNPNo results for input(s): BNP, PROBNP in the last 168 hours.   DDimer No results for input(s): DDIMER in the last 168 hours.    Radiology    ECHOCARDIOGRAM LIMITED  Result Date: 11/10/2020    ECHOCARDIOGRAM LIMITED REPORT   Patient Name:   Cathy [name] Date of Exam: 11/10/2020 Medical Rec #:  [id number]        Height:       67.0 in Accession #:    [id number]       Weight:       159.4 lb Date of Birth:  29-Jan-1981       BSA:          1.836 m Patient Age:    39 years         BP:           157/113 mmHg  Patient Gender: F                HR:           70 bpm. Exam Location:  Inpatient Procedure: Limited Echo, Limited Color Doppler and Cardiac Doppler Indications:    Pericardial effusion 423.9 / I31.3  History:        Patient has prior history of Echocardiogram examinations, most                 recent 11/08/2020. Risk Factors:Hypertension.  Sonographer:    Bernadene Person RDCS Referring Phys: [place]  1. Left ventricular ejection fraction, by estimation, is 60 to 65%. The left ventricle has normal function. The left ventricle has no regional wall motion abnormalities. There is moderate left ventricular hypertrophy.  2. Right ventricular systolic function is mildly reduced. The right ventricular size is mildly enlarged. There is normal pulmonary artery systolic pressure.  3. Large circumferential effusion persists since echo done 11/08/20 IVC less dilated and actually decreases with respiration but mild decriment in mitral inflows with inspiration No RV/RA collapse in diastole Clinicial correlation suggested with possible  need for right heart cath and diagnostic pericardiocentesis given patients diagnosis of Lupus as well as persistence of effusion . Large pericardial effusion. The pericardial effusion is circumferential.  4. The mitral valve is abnormal. Mild mitral valve regurgitation. No evidence of mitral stenosis.  5. The aortic valve is normal in structure. Aortic valve regurgitation is not visualized. No aortic stenosis is present.  6. The inferior vena cava is dilated in size with >50% respiratory variability, suggesting right atrial pressure of 8 mmHg. FINDINGS  Left Ventricle: Left ventricular ejection fraction, by estimation, is 60 to 65%. The left ventricle has normal function. The left ventricle has no regional wall motion abnormalities. The left ventricular internal cavity size was normal in size. There  is  moderate left ventricular hypertrophy. Right Ventricle: The right  ventricular size is mildly enlarged. Right vetricular wall thickness was not assessed. Right ventricular systolic function is mildly reduced. There is normal pulmonary artery systolic pressure. The tricuspid regurgitant velocity is 2.15 m/s, and with an assumed right atrial pressure of 3 mmHg, the estimated right ventricular systolic pressure is 18.2 mmHg. Left Atrium: Left atrial size was normal in size. Right Atrium: Right atrial size was normal in size. Pericardium: Large circumferential effusion persists since echo done 11/08/20 IVC less dilated and actually decreases with respiration but mild decriment in mitral inflows with inspiration No RV/RA collapse in diastole Clinicial correlation suggested with  possible need for right heart cath and diagnostic pericardiocentesis given patients diagnosis of Lupus as well as persistence of effusion. A large pericardial effusion is present. The pericardial effusion is circumferential. Mitral Valve: The mitral valve is abnormal. There is moderate thickening of the mitral valve leaflet(s). There is mild calcification of the mitral valve leaflet(s). Mild mitral valve regurgitation. No evidence of mitral valve stenosis. Tricuspid Valve: The tricuspid valve is normal in structure. Tricuspid valve regurgitation is mild . No evidence of tricuspid stenosis. Aortic Valve: The aortic valve is normal in structure. Aortic valve regurgitation is not visualized. No aortic stenosis is present. Pulmonic Valve: The pulmonic valve was normal in structure. Pulmonic valve regurgitation is not visualized. No evidence of pulmonic stenosis. Aorta: The aortic root is normal in size and structure. Venous: The inferior vena cava is dilated in size with greater than 50% respiratory variability, suggesting right atrial pressure of 8 mmHg. IAS/Shunts: No atrial level shunt detected by color flow Doppler. LEFT VENTRICLE PLAX 2D LVIDd:         5.00 cm LVIDs:         2.90 cm LV PW:         1.60 cm LV  IVS:        1.20 cm LVOT diam:     1.70 cm LVOT Area:     2.27 cm  LEFT ATRIUM         Index LA diam:    3.50 cm 1.91 cm/m   AORTA Ao Root diam: 2.70 cm Ao Asc diam:  2.70 cm TRICUSPID VALVE TR Peak grad:   18.5 mmHg TR Vmax:        215.00 cm/s  SHUNTS Systemic Diam: 1.70 cm Jenkins Rouge MD Electronically signed by Jenkins Rouge MD Signature Date/Time: 11/10/2020/2:12:01 PM    Final     Cardiac Studies   Echocardiogram 11/08/20: 1. Left ventricular ejection fraction, by estimation, is 60 to 65%. The  left ventricle has normal function. The left ventricle has no regional  wall motion abnormalities. There is severe left ventricular hypertrophy.  2. Right ventricular systolic function is normal. The right ventricular  size is normal. Mildly increased right ventricular wall thickness. There  is normal pulmonary artery systolic pressure.  3. Large pericardial effusion surrounds heart Measures 17 mm in maximal  dimension. Mitral inflow is without significant respiratory variation;  there is mild RV indentation but no RV collapse But IVC is dilated Does  not meet echo criteria for tamponade  CLinical correlation indicated. . Large pericardial effusion. The  pericardial effusion is circumferential.  4. MR is eccentric, directed more posterior into LA. The mitral valve is  abnormal. Mild mitral valve regurgitation.  5. The aortic valve is tricuspid. Aortic valve regurgitation is trivial.  Mild aortic valve sclerosis is present, with no evidence  of aortic valve  stenosis.   2D echo 11/21/2020 IMPRESSIONS   1. Left ventricular ejection fraction, by estimation, is 60 to 65%. The  left ventricle has normal function. The left ventricle has no regional  wall motion abnormalities. There is moderate left ventricular hypertrophy.  2. Right ventricular systolic function is mildly reduced. The right  ventricular size is mildly enlarged. There is normal pulmonary artery  systolic pressure.  3.  Large circumferential effusion persists since echo done 11/08/20 IVC  less dilated and actually decreases with respiration but mild decriment in  mitral inflows with inspiration No RV/RA collapse in diastole Clinicial  correlation suggested with possible  need for right heart cath and diagnostic pericardiocentesis given  patients diagnosis of Lupus as well as persistence of effusion . Large  pericardial effusion. The pericardial effusion is circumferential.  4. The mitral valve is abnormal. Mild mitral valve regurgitation. No  evidence of mitral stenosis.  5. The aortic valve is normal in structure. Aortic valve regurgitation is  not visualized. No aortic stenosis is present.  6. The inferior vena cava is dilated in size with >50% respiratory  variability, suggesting right atrial pressure of 8 mmHg.  Patient Profile     39 y.o. 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 Erlinda Hong.  Assessment & Plan    1. Pericardial effusion: patient presented with generalized swelling and other non-specific complaints. Found to have anasarca likely 2/2 volume overload in the setting of nephrotic syndrome. Nephrology following for assistance with diuresis. Echo revealed large circumferential pericardial effusion without evidence of tamponade. She has been diuresing with IV lasix 60mg  BID with albumin. -she put out 1L yesterday and is net+ 4.2L - Continue diuresis per nephrology - repeat 2D echo showed persistence of large pericardial effusion>>the IVC is less dilated and decreases with respiration but there is now a mild decrement in MV inflow velocities.  There is no RV/RA collapse in diastole -she has been started on steroids per nephrology and feels much better today -repeat echo tomorrow am>>-may need to consider right  heart cath with pericardiocentesis but hoping that treatment of Lupus will slowly resolve effusion  2. Chest pain/DOE: these appear to be chronic issues occurring for years. No recent change and no complaints of chest pain at rest. Risk factors for CAD include poorly controlled HTN, SLE, and evidence of aortic atherosclerosis on CT scan. Non-fasting lipids this admission with LDL 109. Echo with EF 60-65% with no RWMA, severe LVH, and a large pericardial effusion with out tamponade.   - Favor ongoing efforts to diurese - Can consider an outpatient ischemic evaluation once volume status and HTN management stabilize.   3. HTN:  -BP poorly controlled in the setting of medication non-compliance.  -BP remains elevated at 163/133mmHg this am -Previously prescribed amlodipine and labetalol restarted -Amlodipine restarted at 10mg  daily and may take a few days to take full effect -may need additional antihypertensive meds>>will get renal input -Continue current medications - further titration pending ongoing diuresis  4. Anemia: Hgb dropped to 6.2 this admission, up to 8.4 following 1 uPRBC. No complaints of bleeding. Felt to be 2/2 lupus.  - Hbg continues to slowly drop and now 7.9 today - Continue management per primary team  5. AoCKD stage 2/3: Cr 2.5 on admission, down  to 2.2 yesterday and now trending back upward at 2.64 - Nephrology following given history of lupus nephritis on prior kidney biopsy.  - now on steroids per renal - Continue close monitoring and management per nephrology  6. 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 Lupus: diagnosed in 2019. Has not been on medications. Lupus labs pending. Needs to establish with rheumatology  - Continue management per primary team  8. UTI: klebsiella + UCx. Currently on IV antibiotics - Continue management per primary team  I have spent a  total of 30 minutes with patient reviewing 2D echo , telemetry, EKGs, labs and examining patient as well as establishing an assessment and plan that was discussed with the patient.  > 50% of time was spent in direct patient care.       For questions or updates, please contact [place] Please consult [email or web address] for contact info under        Signed, Fransico Him, MD  11/11/2020, 8:14 AM

## 2020-11-11 NOTE — Progress Notes (Addendum)
PROGRESS NOTE    Cathy [name]  WJX:[id number] DOB: 1981/07/15 DOA: 11/07/2020 PCP: Patient, No Pcp Per    Chief Complaint  Patient presents with  . Dysuria  . Leg Swelling    bilateral  . Cough    Brief Narrative:  This is a 39 year old female with past medical history of bipolar depression, SLE, PID, renal hypertension, substance abuse who presented to the ED with 3 weeks of multiple nonspecific complaints most notably abdominal discomfort and distention.  Associated with lower extremity and abdominal edema.  Has a mild cough with some chest tightness associated with coughing and DOE.  Also some wheezing.  No fever or chills.  She was recently seen in the urgent care on 11/17 at which time she had similar complaints and found to have a UTI, hypertensive urgency and was referred to primary care and rheumatology.  She has not been seen by either.  States she ran out of her amlodipine 2 weeks ago and is currently not taking any medications at all  Subjective:  1000cc urine output documented last 24hrs,  She is started on prednisone yesterday , she reports feeling better ,she reports remain edematous   Denies chest pain , she is on room air  No diarrhea since in hospital No fever  She is a poor historian Assessment & Plan:   Principal Problem:   Anasarca Active Problems:   Lupus ([place])   Acute renal failure (ARF) (HCC)   Anemia   Aortic atherosclerosis (HCC)   Abdominal pain   Diarrhea   AKI with generalized edema, hypoalbuminenia, proteinuria with history of uncontrolled lupus -Urine culture greater than 100,000 Klebsiella pneumoniae, received Rocephin, change to keflex per sensitivity result plan to total of 7 days abx treatment end on 12/14 (some perinephrinic stranding on CT scan) -low complement, + DSDNA, + anti smith, + centromere ab -Nephrology consulted, currently on IV Lasix , IV albumin infusion -defer renal biopsy and management of lupus nephritis to  nephrology, she is started on steroids on 12/11 -Nephrology input appreciated  Metabolic acidosis, bicarb supplement  Hyponatremia, likely from volume overload, normalized on IV Lasix   Acute on chronic normocytic anemia with baseline anemia from chronic disease -Currently does not appear to have gross blood loss -Less likely hemolysis due to normal total bilirubin -acute component Suspect due to hemodilution from  Edema,  S/p 1 units PRBC -Check Fobt  Anasarca -Marked gastric fold thickening may be due to hyperproteinemia or severe anasarca. Haustral thickening of the colon also may be related to anasarca, -gi prc panel negative, no ab pain, no significant diarrhea since admitted, normal lactic acid -Currently getting IV albumin and IV Lasix  Large pericardioeffusion No sign of tamponade Cardiology consulted, repeat echocardiogram on Monday, will follow recommendation  Hypertension -Noncompliance with medication -Continue Norvasc and labetalol, IV Lasix, bloop pressure remain elevated, add cardizem, monitor   Systemic Lupus Per chart review was on hydroxychloroquine and prednisone Does not appear to be compliance with medication Does not appear to follow with rheumatology regularly Low complement  Case discussed with rheumatology Dr. Benjamine Mola who agreed with steroid treatment and outpatient follow-up  chronic itchy and painful rash on buttocks and thighs, underwent biopsy last year, but did not follow up on result Topical steroid provided  Lymphadenopathy on CT abdomen pelvis, HIV negative, outpatient follow up  Aortic atherosclerosis, hyperlipidemia, will check fasting lipid panel, last one was not obtained fasting  Following result, will likely benefit from statin therapy, however compliance is  an issue  she reports no medical follow ups after lost her insurance , case manager consulted   DVT prophylaxis: heparin injection 5,000 Units Start: 11/07/20 1630   Code  Status: Full Family Communication: Patient Disposition:   Status is: Inpatient  Dispo: The patient is from: Home              Anticipated d/c is to: Home              Anticipated d/c date is:               Patient currently not medically ready to discharge, need cardiology and nephrology clearance  Consultants:   Nephrology  Cardiology  Rheumatology Dr. Benjamine Mola  Procedures:   PRBC transfusion x1  Antimicrobials:   Rocephin then keflex     Objective: Vitals:   11/10/20 0614 11/10/20 1456 11/10/20 2038 11/11/20 0554  BP: (!) 157/113 (!) 138/95 (!) 148/106 (!) 163/111  Pulse: 73 73 77 71  Resp: 14 (!) 21 16 16   Temp: 98.2 F (36.8 C) 99.1 F (37.3 C) 98.4 F (36.9 C) 97.9 F (36.6 C)  TempSrc: Oral Oral Oral Oral  SpO2: 99% 99% 98% 99%  Weight:      Height:        Intake/Output Summary (Last 24 hours) at 11/11/2020 0819 Last data filed at 11/11/2020 0755 Gross per 24 hour  Intake 1860 ml  Output 1300 ml  Net 560 ml   Filed Weights   11/08/20 1543 11/09/20 0546 11/10/20 0500  Weight: 63.5 kg 71.8 kg 72.3 kg    Examination:  General exam: Generalized edema, NAD Respiratory system: Diminished at bases, Respiratory effort normal. Cardiovascular system: S1 & S2 heard, RRR. Soft systolic murmur right upper sternal border Gastrointestinal system: Protuberant abdomen , soft and nontender. Normal bowel sounds heard. Central nervous system: Alert and oriented. No focal neurological deficits. Extremities: Symmetric 5 x 5 power.  mild edema in bilateral leg and thigh Skin: Chronic rash bilateral buttock and thighs, no drainage, no open wound, does not appear infected Psychiatry: Calm and cooperative, but poor historian    Data Reviewed: I have personally reviewed following labs and imaging studies  CBC: Recent Labs  Lab 11/07/20 1104 11/07/20 2245 11/08/20 0603 11/09/20 0707 11/10/20 0657 11/11/20 0549  WBC 7.2  --  5.7 5.1 4.9 3.6*  NEUTROABS 5.3  --    --   --   --   --   HGB 7.3* 6.2* 6.9* 8.4* 8.0* 7.9*  HCT 22.6* 18.6* 20.8* 25.2* 24.7* 23.7*  MCV 85.6  --  86.3 87.2 88.2 86.5  PLT 171  --  155 150 165 250    Basic Metabolic Panel: Recent Labs  Lab 11/07/20 1104 11/08/20 0603 11/09/20 0707 11/10/20 0657 11/11/20 0549  NA 133* 133* 137 135 136  K 5.1 4.6 4.6 4.2 4.8  CL 107 108 108 109 108  CO2 17* 16* 20* 16* 16*  GLUCOSE 99 103* 99 115* 154*  BUN 53* 46* 54* 52* 65*  CREATININE 2.50* 2.26* 2.21* 2.25* 2.64*  CALCIUM 6.9* 6.9* 7.0* 7.0* 7.6*  PHOS  --   --  6.2* 6.2* 6.5*    GFR: Estimated Creatinine Clearance: 28.4 mL/min (A) (by C-G formula based on SCr of 2.64 mg/dL (H)).  Liver Function Tests: Recent Labs  Lab 11/06/20 1759 11/07/20 1104 11/08/20 0603 11/09/20 0707 11/10/20 0657 11/11/20 0549  AST 22 22  --   --   --   --  ALT 14 14  --   --   --   --   ALKPHOS 84 89  --   --   --   --   BILITOT 0.3 0.4  --   --   --   --   PROT 5.0* 4.8*  --   --   --   --   ALBUMIN 1.6* 1.5* 2.3* 2.2* 2.0* 3.0*    CBG: No results for input(s): GLUCAP in the last 168 hours.   Recent Results (from the past 240 hour(s))  Resp Panel by RT-PCR (Flu A&B, Covid) Nasopharyngeal Swab     Status: None   Collection Time: 11/07/20 11:06 AM   Specimen: Nasopharyngeal Swab; Nasopharyngeal(NP) swabs in vial transport medium  Result Value Ref Range Status   SARS Coronavirus 2 by RT PCR NEGATIVE NEGATIVE Final    Comment: (NOTE) SARS-CoV-2 target nucleic acids are NOT DETECTED.  The SARS-CoV-2 RNA is generally detectable in upper respiratory specimens during the acute phase of infection. The lowest concentration of SARS-CoV-2 viral copies this assay can detect is 138 copies/mL. A negative result does not preclude SARS-Cov-2 infection and should not be used as the sole basis for treatment or other patient management decisions. A negative result may occur with  improper specimen collection/handling, submission of specimen  other than nasopharyngeal swab, presence of viral mutation(s) within the areas targeted by this assay, and inadequate number of viral copies(<138 copies/mL). A negative result must be combined with clinical observations, patient history, and epidemiological information. The expected result is Negative.  Fact Sheet for Patients:  EntrepreneurPulse.com.au  Fact Sheet for Healthcare Providers:  IncredibleEmployment.be  This test is no t yet approved or cleared by the [place] FDA and  has been authorized for detection and/or diagnosis of SARS-CoV-2 by FDA under an Emergency Use Authorization (EUA). This EUA will remain  in effect (meaning this test can be used) for the duration of the COVID-19 declaration under Section 564(b)(1) of the Act, 21 U.S.C.section 360bbb-3(b)(1), unless the authorization is terminated  or revoked sooner.       Influenza A by PCR NEGATIVE NEGATIVE Final   Influenza B by PCR NEGATIVE NEGATIVE Final    Comment: (NOTE) The Xpert Xpress SARS-CoV-2/FLU/RSV plus assay is intended as an aid in the diagnosis of influenza from Nasopharyngeal swab specimens and should not be used as a sole basis for treatment. Nasal washings and aspirates are unacceptable for Xpert Xpress SARS-CoV-2/FLU/RSV testing.  Fact Sheet for Patients: EntrepreneurPulse.com.au  Fact Sheet for Healthcare Providers: IncredibleEmployment.be  This test is not yet approved or cleared by the [place] FDA and has been authorized for detection and/or diagnosis of SARS-CoV-2 by FDA under an Emergency Use Authorization (EUA). This EUA will remain in effect (meaning this test can be used) for the duration of the COVID-19 declaration under Section 564(b)(1) of the Act, 21 U.S.C. section 360bbb-3(b)(1), unless the authorization is terminated or revoked.  Performed at [facility], [place] [address] [place], [place] [address]   Urine Culture     Status: Abnormal   Collection Time: 11/07/20 12:49 PM   Specimen: Urine, Clean Catch  Result Value Ref Range Status   Specimen Description   Final    URINE, CLEAN CATCH Performed at [facility], [place] [address], [place], [place] [address]    Special Requests   Final    NONE Performed at [facility], [place] [address] [place], [place] [address]  Culture >=100,000 COLONIES/mL KLEBSIELLA PNEUMONIAE (A)  Final   Report Status 11/09/2020 FINAL  Final   Organism ID, Bacteria KLEBSIELLA PNEUMONIAE (A)  Final      Susceptibility   Klebsiella pneumoniae - MIC*    AMPICILLIN RESISTANT Resistant     CEFAZOLIN <=4 SENSITIVE Sensitive     CEFEPIME <=0.12 SENSITIVE Sensitive     CEFTRIAXONE <=0.25 SENSITIVE Sensitive     CIPROFLOXACIN <=0.25 SENSITIVE Sensitive     GENTAMICIN <=1 SENSITIVE Sensitive     IMIPENEM <=0.25 SENSITIVE Sensitive     NITROFURANTOIN 128 RESISTANT Resistant     TRIMETH/SULFA <=20 SENSITIVE Sensitive     AMPICILLIN/SULBACTAM <=2 SENSITIVE Sensitive     PIP/TAZO <=4 SENSITIVE Sensitive     * >=100,000 COLONIES/mL KLEBSIELLA PNEUMONIAE  MRSA PCR Screening     Status: None   Collection Time: 11/09/20 11:40 AM   Specimen: Nasal Mucosa; Nasopharyngeal  Result Value Ref Range Status   MRSA by PCR NEGATIVE NEGATIVE Final    Comment:        The GeneXpert MRSA Assay (FDA approved for NASAL specimens only), is one component of a comprehensive MRSA colonization surveillance program. It is not intended to diagnose MRSA infection nor to guide or monitor treatment for MRSA infections. Performed at [facility], [place] [address], [place], [place] [address]   Gastrointestinal Panel by PCR , Stool     Status: None   Collection Time: 11/10/20 12:00 PM   Specimen: Stool  Result Value Ref Range Status   Campylobacter species NOT DETECTED NOT DETECTED Final   Plesimonas  shigelloides NOT DETECTED NOT DETECTED Final   Salmonella species NOT DETECTED NOT DETECTED Final   Yersinia enterocolitica NOT DETECTED NOT DETECTED Final   Vibrio species NOT DETECTED NOT DETECTED Final   Vibrio cholerae NOT DETECTED NOT DETECTED Final   Enteroaggregative E coli (EAEC) NOT DETECTED NOT DETECTED Final   Enteropathogenic E coli (EPEC) NOT DETECTED NOT DETECTED Final   Enterotoxigenic E coli (ETEC) NOT DETECTED NOT DETECTED Final   Shiga like toxin producing E coli (STEC) NOT DETECTED NOT DETECTED Final   Shigella/Enteroinvasive E coli (EIEC) NOT DETECTED NOT DETECTED Final   Cryptosporidium NOT DETECTED NOT DETECTED Final   Cyclospora c[place]etanensis NOT DETECTED NOT DETECTED Final   Entamoeba histolytica NOT DETECTED NOT DETECTED Final   Giardia lamblia NOT DETECTED NOT DETECTED Final   Adenovirus F40/41 NOT DETECTED NOT DETECTED Final   Astrovirus NOT DETECTED NOT DETECTED Final   Norovirus GI/GII NOT DETECTED NOT DETECTED Final   Rotavirus A NOT DETECTED NOT DETECTED Final   Sapovirus (I, II, IV, and V) NOT DETECTED NOT DETECTED Final    Comment: Performed at [facility], [address], [place], [place] [address]         Radiology Studies: ECHOCARDIOGRAM LIMITED  Result Date: 11/10/2020    ECHOCARDIOGRAM LIMITED REPORT   Patient Name:   AZLEE MONFORTE Date of Exam: 11/10/2020 Medical Rec #:  [id number]        Height:       67.0 in Accession #:    [id number]       Weight:       159.4 lb Date of Birth:  Jul 03, 1981       BSA:          1.836 m Patient Age:    39 years         BP:  157/113 mmHg Patient Gender: F                HR:           70 bpm. Exam Location:  Inpatient Procedure: Limited Echo, Limited Color Doppler and Cardiac Doppler Indications:    Pericardial effusion 423.9 / I31.3  History:        Patient has prior history of Echocardiogram examinations, most                 recent 11/08/2020. Risk Factors:Hypertension.  Sonographer:    Bernadene Person RDCS Referring Phys: [place]  1. Left ventricular ejection fraction, by estimation, is 60 to 65%. The left ventricle has normal function. The left ventricle has no regional wall motion abnormalities. There is moderate left ventricular hypertrophy.  2. Right ventricular systolic function is mildly reduced. The right ventricular size is mildly enlarged. There is normal pulmonary artery systolic pressure.  3. Large circumferential effusion persists since echo done 11/08/20 IVC less dilated and actually decreases with respiration but mild decriment in mitral inflows with inspiration No RV/RA collapse in diastole Clinicial correlation suggested with possible  need for right heart cath and diagnostic pericardiocentesis given patients diagnosis of Lupus as well as persistence of effusion . Large pericardial effusion. The pericardial effusion is circumferential.  4. The mitral valve is abnormal. Mild mitral valve regurgitation. No evidence of mitral stenosis.  5. The aortic valve is normal in structure. Aortic valve regurgitation is not visualized. No aortic stenosis is present.  6. The inferior vena cava is dilated in size with >50% respiratory variability, suggesting right atrial pressure of 8 mmHg. FINDINGS  Left Ventricle: Left ventricular ejection fraction, by estimation, is 60 to 65%. The left ventricle has normal function. The left ventricle has no regional wall motion abnormalities. The left ventricular internal cavity size was normal in size. There is  moderate left ventricular hypertrophy. Right Ventricle: The right ventricular size is mildly enlarged. Right vetricular wall thickness was not assessed. Right ventricular systolic function is mildly reduced. There is normal pulmonary artery systolic pressure. The tricuspid regurgitant velocity is 2.15 m/s, and with an assumed right atrial pressure of 3 mmHg, the estimated right ventricular systolic pressure is 16.1 mmHg. Left Atrium:  Left atrial size was normal in size. Right Atrium: Right atrial size was normal in size. Pericardium: Large circumferential effusion persists since echo done 11/08/20 IVC less dilated and actually decreases with respiration but mild decriment in mitral inflows with inspiration No RV/RA collapse in diastole Clinicial correlation suggested with  possible need for right heart cath and diagnostic pericardiocentesis given patients diagnosis of Lupus as well as persistence of effusion. A large pericardial effusion is present. The pericardial effusion is circumferential. Mitral Valve: The mitral valve is abnormal. There is moderate thickening of the mitral valve leaflet(s). There is mild calcification of the mitral valve leaflet(s). Mild mitral valve regurgitation. No evidence of mitral valve stenosis. Tricuspid Valve: The tricuspid valve is normal in structure. Tricuspid valve regurgitation is mild . No evidence of tricuspid stenosis. Aortic Valve: The aortic valve is normal in structure. Aortic valve regurgitation is not visualized. No aortic stenosis is present. Pulmonic Valve: The pulmonic valve was normal in structure. Pulmonic valve regurgitation is not visualized. No evidence of pulmonic stenosis. Aorta: The aortic root is normal in size and structure. Venous: The inferior vena cava is dilated in size with greater than 50% respiratory variability, suggesting right atrial pressure of 8 mmHg.  IAS/Shunts: No atrial level shunt detected by color flow Doppler. LEFT VENTRICLE PLAX 2D LVIDd:         5.00 cm LVIDs:         2.90 cm LV PW:         1.60 cm LV IVS:        1.20 cm LVOT diam:     1.70 cm LVOT Area:     2.27 cm  LEFT ATRIUM         Index LA diam:    3.50 cm 1.91 cm/m   AORTA Ao Root diam: 2.70 cm Ao Asc diam:  2.70 cm TRICUSPID VALVE TR Peak grad:   18.5 mmHg TR Vmax:        215.00 cm/s  SHUNTS Systemic Diam: 1.70 cm Jenkins Rouge MD Electronically signed by Jenkins Rouge MD Signature Date/Time: 11/10/2020/2:12:01  PM    Final         Scheduled Meds: . sodium chloride   Intravenous Once  . amLODipine  10 mg Oral Daily  . famotidine  10 mg Oral Daily  . furosemide  80 mg Intravenous Q12H  . guaiFENesin  1,200 mg Oral BID  . heparin  5,000 Units Subcutaneous Q8H  . hydrocortisone cream   Topical BID  . influenza vac split quadrivalent PF  0.5 mL Intramuscular Tomorrow-1000  . labetalol  400 mg Oral BID  . predniSONE  50 mg Oral Q breakfast  . sodium bicarbonate  1,300 mg Oral BID   Continuous Infusions: . sodium chloride 20 mL/hr at 11/07/20 1545  . cefTRIAXone (ROCEPHIN)  IV 1 g (11/10/20 1558)     LOS: 4 days   Time spent: 41mins, case discussed with nephrology Greater than 50% of this time was spent in counseling, explanation of diagnosis, planning of further management, and coordination of care.  I have personally reviewed and interpreted on  11/11/2020 daily labs, I reviewed all nursing notes, pharmacy notes, consultant notes,  vitals, pertinent old records  I have discussed plan of care as described above with RN , patient  on 11/11/2020  Voice Recognition /Dragon dictation system was used to create this note, attempts have been made to correct errors. Please contact the author with questions and/or clarifications.   Florencia Reasons, MD PhD FACP [facility]  Available via [facility] secure chat 7am-7pm for nonurgent issues Please page for urgent issues To page the attending provider between 7A-7P or the covering provider during after hours 7P-7A, please log into the web site [email or web address] and access using universal [place] password for that web site. If you do not have the password, please call the hospital operator.    11/11/2020, 8:19 AM

## 2020-11-12 ENCOUNTER — Inpatient Hospital Stay (HOSPITAL_COMMUNITY): Payer: Medicaid Other

## 2020-11-12 DIAGNOSIS — R601 Generalized edema: Secondary | ICD-10-CM

## 2020-11-12 DIAGNOSIS — I313 Pericardial effusion (noninflammatory): Secondary | ICD-10-CM

## 2020-11-12 LAB — LIPID PANEL
Cholesterol: 299 mg/dL — ABNORMAL HIGH (ref 0–200)
HDL: 23 mg/dL — ABNORMAL LOW (ref >40)
LDL Cholesterol: UNDETERMINED mg/dL (ref 0–99)
Total CHOL/HDL Ratio: 13 RATIO
Triglycerides: 568 mg/dL — ABNORMAL HIGH (ref <150)
VLDL: UNDETERMINED mg/dL (ref 0–40)

## 2020-11-12 LAB — BASIC METABOLIC PANEL
Anion gap: 11 (ref 5–15)
BUN: 78 mg/dL — ABNORMAL HIGH (ref 6–20)
CO2: 17 mmol/L — ABNORMAL LOW (ref 22–32)
Calcium: 7.4 mg/dL — ABNORMAL LOW (ref 8.9–10.3)
Chloride: 109 mmol/L (ref 98–111)
Creatinine, Ser: 2.88 mg/dL — ABNORMAL HIGH (ref 0.44–1.00)
GFR, Estimated: 21 mL/min — ABNORMAL LOW (ref >60)
Glucose, Bld: 113 mg/dL — ABNORMAL HIGH (ref 70–99)
Potassium: 4.9 mmol/L (ref 3.5–5.1)
Sodium: 137 mmol/L (ref 135–145)

## 2020-11-12 LAB — CBC
HCT: 24.4 % — ABNORMAL LOW (ref 36.0–46.0)
Hemoglobin: 7.9 g/dL — ABNORMAL LOW (ref 12.0–15.0)
MCH: 28.6 pg (ref 26.0–34.0)
MCHC: 32.4 g/dL (ref 30.0–36.0)
MCV: 88.4 fL (ref 80.0–100.0)
Platelets: 187 10*3/uL (ref 150–400)
RBC: 2.76 MIL/uL — ABNORMAL LOW (ref 3.87–5.11)
RDW: 15.8 % — ABNORMAL HIGH (ref 11.5–15.5)
WBC: 8.1 10*3/uL (ref 4.0–10.5)
nRBC: 0 % (ref 0.0–0.2)

## 2020-11-12 LAB — ECHOCARDIOGRAM COMPLETE
Area-P 1/2: 2.5 cm2
Height: 67 in
MV M vel: 5.86 m/s
MV Peak grad: 137.4 mmHg
P 1/2 time: 492 msec
Radius: 0.4 cm
S' Lateral: 3.2 cm
Weight: 2550.28 oz

## 2020-11-12 LAB — LDL CHOLESTEROL, DIRECT: Direct LDL: 154.7 mg/dL — ABNORMAL HIGH (ref 0–99)

## 2020-11-12 MED ORDER — FENOFIBRATE 160 MG PO TABS
160.0000 mg | ORAL_TABLET | Freq: Every day | ORAL | Status: DC
  Administered 2020-11-12 – 2020-11-13 (×2): 160 mg via ORAL
  Filled 2020-11-12 (×2): qty 1

## 2020-11-12 NOTE — Progress Notes (Signed)
  Echocardiogram 2D Echocardiogram has been performed.  Fidel Levy 11/12/2020, 8:42 AM

## 2020-11-12 NOTE — Progress Notes (Signed)
PROGRESS NOTE    Cathy [name]  NWG:[id number] DOB: [birth year] DOA: 11/07/2020 PCP: Patient, No Pcp Per   Chief Complain: Bilateral leg swelling, dysuria, cough  Brief Narrative: Patient is a 39 female with history of SLE, lupus nephritis, hypertension, substance abuse, bipolar disorder, depression who presented to the emergency department with complaints of abdominal discomfort, distention, leg swelling.  She was not taking any medications at home for last few days before admission.  Patient was admitted for the management of anasarca.  She was admitted for IV diuretics.  Nephrology and cardiology  following.  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  History of uncontrolled lupus/lupus nephritis, hypertensive nephrosclerosis.  Has significant proteinuria.  Found to have low levels of complements, double-stranded DNA positive, anti-Smith antibody positive, centromere antibody positive.   Suspected to have progression of nephritis.  Nephrology holding renal biopsy for now due to presence of UTI/recurrent infection. Nephrology following.  She was started on steroids on 12/11  Lupus flare: Started on prednisone 50 mg.  Will gradually taper.Patient was previously on hydroxychloroquine, prednisone.  She appears noncompliant with her medications.  She has not make outpatient appointment with rheumatology regularly.  Case was discussed with her rheumatologist Dr. Benjamine Mola who recommended outpatient follow-up  Anasarca: She was started on albumin, IV Lasix which is on hold due to AKI.  Anasarca has improved.  She was complaining of abdominal pain on presentation.CT abdomen/pelvis showed marked gastric fold thickening may be due to hyperproteinemia or severe anasarca.  She has  significant abdominal distention  Chronic normocytic anemia: She was given a unit of PRBC during this hospitalization.  Currently hemoglobin stable in the range of 7.  No report of melena or hematochezia.  UTI: Continue Keflex.  Urine culture showed significant Klebsiella pneumonia.  Hyponatremia: Resolved with IV Lasix.  Pericardial effusion: Patient also found to have pericardial effusion.  Echo ruled out tamponade.  Echo showed ejection fraction of 55 to 57%, grade 2 diastolic dysfunction, moderate pericardial effusion.  Cardiology recommended to repeat echo as an outpatient.  Non-anion gap metabolic acidosis: Continue sodium bicarb tablets.  Hypertension: Noncompliant with medication.  Continue Norvasc , labetalol  Rash on the buttock/thigh: Continue topical steroid  Lymphadenopathy: As seen on CT abdomen/pelvis.  HIV negative.  Outpatient follow-up recommended.  Elevated triglyceride level: Started on fenofibrate.  Needs to repeat lipid panel in 4 to 6 weeks.            DVT prophylaxis:Heparin [place] Code Status: Full Family Communication: None at bedside Status is: Inpatient  Remains inpatient appropriate because:Inpatient level of care appropriate due to severity of illness   Dispo: The patient is from: Home              Anticipated d/c is to: Home              Anticipated d/c date is:1-2 days              Patient currently is not medically stable to d/c.    Consultants: Nephrology, cardiology  Procedures: None  Antimicrobials:  Anti-infectives (From admission, onward)   Start     Dose/Rate Route Frequency Ordered Stop   11/11/20 1400  cephALEXin (KEFLEX) capsule 500 mg  500 mg Oral Every 8 hours 11/11/20 1253 11/13/20 2359   11/07/20 1530  cefTRIAXone (ROCEPHIN) 1 g in sodium chloride 0.9 % 100 mL IVPB  Status:  Discontinued        1 g 200 mL/hr over 30 Minutes Intravenous Every 24 hours 11/07/20 1517 11/11/20 1253      Subjective: Patient seen and  examined the bedside this afternoon.  Hemodynamically stable.  Comfortable.  Eager to go home.  Still looks  volume overloaded.  Denies any complaints today.  Objective: Vitals:   11/11/20 2040 11/11/20 2248 11/12/20 0542 11/12/20 1324  BP: 132/87 (!) 135/91 (!) 143/100 125/86  Pulse: 73 68 64 63  Resp: 16  16 14   Temp: 97.9 F (36.6 C)  98 F (36.7 C) 97.9 F (36.6 C)  TempSrc: Oral  Oral Oral  SpO2: 97%  100% 99%  Weight:      Height:        Intake/Output Summary (Last 24 hours) at 11/12/2020 1342 Last data filed at 11/12/2020 0551 Gross per 24 hour  Intake 1080 ml  Output 750 ml  Net 330 ml   Filed Weights   11/08/20 1543 11/09/20 0546 11/10/20 0500  Weight: 63.5 kg 71.8 kg 72.3 kg    Examination:  General exam: Appears calm and comfortable ,Not in distress,average built HEENT:PERRL,Oral mucosa moist, Ear/Nose normal on gross exam Respiratory system: Bilateral equal air entry, normal vesicular breath sounds, no wheezes or crackles  Cardiovascular system: S1 & S2 heard, RRR. No JVD, murmurs, rubs, gallops or clicks.  Trace pedal edema. Gastrointestinal system: Abdomen is distended, soft and nontender. No organomegaly or masses felt. Normal bowel sounds heard. Central nervous system: Alert and oriented. No focal neurological deficits. Extremities: 1-2+ bilateral lower extremity pitting edema, no clubbing ,no cyanosis Skin: Lupus rashes, no ulcers,no icterus ,no pallor   Data Reviewed: I have personally reviewed following labs and imaging studies  CBC: Recent Labs  Lab 11/07/20 1104 11/07/20 2245 11/08/20 0603 11/09/20 0707 11/10/20 0657 11/11/20 0549 11/12/20 0526  WBC 7.2  --  5.7 5.1 4.9 3.6* 8.1  NEUTROABS 5.3  --   --   --   --   --   --   HGB 7.3*   < > 6.9* 8.4* 8.0* 7.9* 7.9*  HCT 22.6*   < > 20.8* 25.2* 24.7* 23.7* 24.4*  MCV 85.6  --  86.3 87.2 88.2 86.5 88.4  PLT 171  --  155 150 165 163 187   < > = values in this interval not displayed.    Basic Metabolic Panel: Recent Labs  Lab 11/08/20 0603 11/09/20 0707 11/10/20 0657 11/11/20 0549 11/12/20 0526  NA 133* 137 135 136 137  K 4.6 4.6 4.2 4.8 4.9  CL 108 108 109 108 109  CO2 16* 20* 16* 16* 17*  GLUCOSE 103* 99 115* 154* 113*  BUN 46* 54* 52* 65* 78*  CREATININE 2.26* 2.21* 2.25* 2.64* 2.88*  CALCIUM 6.9* 7.0* 7.0* 7.6* 7.4*  PHOS  --  6.2* 6.2* 6.5*  --    GFR: Estimated Creatinine Clearance: 26 mL/min (A) (by C-G formula based on SCr of 2.88 mg/dL (H)). Liver Function Tests: Recent Labs  Lab 11/06/20 1759 11/07/20 1104 11/08/20 0603 11/09/20 0707 11/10/20 0657 11/11/20 0549  AST 22 22  --   --   --   --   ALT 14 14  --   --   --   --   ALKPHOS 84 89  --   --   --   --  BILITOT 0.3 0.4  --   --   --   --   PROT 5.0* 4.8*  --   --   --   --   ALBUMIN 1.6* 1.5* 2.3* 2.2* 2.0* 3.0*   Recent Labs  Lab 11/06/20 1759 11/07/20 1104  LIPASE 54* 51   No results for input(s): AMMONIA in the last 168 hours. Coagulation Profile: No results for input(s): INR, PROTIME in the last 168 hours. Cardiac Enzymes: No results for input(s): CKTOTAL, CKMB, CKMBINDEX, TROPONINI in the last 168 hours. BNP (last 3 results) No results for input(s): PROBNP in the last 8760 hours. HbA1C: No results for input(s): HGBA1C in the last 72 hours. CBG: No results for input(s): GLUCAP in the last 168 hours. Lipid Profile: Recent Labs    11/12/20 0526  CHOL 299*  HDL 23*  LDLCALC UNABLE TO CALCULATE IF TRIGLYCERIDE OVER 400 mg/dL  TRIG 568*  CHOLHDL 13.0  LDLDIRECT 154.7*   Thyroid Function Tests: No results for input(s): TSH, T4TOTAL, FREET4, T3FREE, THYROIDAB in the last 72 hours. Anemia Panel: No results for input(s): VITAMINB12, FOLATE, FERRITIN, TIBC, IRON, RETICCTPCT in the last 72 hours. Sepsis Labs: Recent Labs  Lab 11/07/20 1542 11/09/20 0707  LATICACIDVEN 0.5 0.7    Recent Results (from the past 240 hour(s))  Resp Panel by RT-PCR (Flu A&B, Covid)  Nasopharyngeal Swab     Status: None   Collection Time: 11/07/20 11:06 AM   Specimen: Nasopharyngeal Swab; Nasopharyngeal(NP) swabs in vial transport medium  Result Value Ref Range Status   SARS Coronavirus 2 by RT PCR NEGATIVE NEGATIVE Final    Comment: (NOTE) SARS-CoV-2 target nucleic acids are NOT DETECTED.  The SARS-CoV-2 RNA is generally detectable in upper respiratory specimens during the acute phase of infection. The lowest concentration of SARS-CoV-2 viral copies this assay can detect is 138 copies/mL. A negative result does not preclude SARS-Cov-2 infection and should not be used as the sole basis for treatment or other patient management decisions. A negative result may occur with  improper specimen collection/handling, submission of specimen other than nasopharyngeal swab, presence of viral mutation(s) within the areas targeted by this assay, and inadequate number of viral copies(<138 copies/mL). A negative result must be combined with clinical observations, patient history, and epidemiological information. The expected result is Negative.  Fact Sheet for Patients:  EntrepreneurPulse.com.au  Fact Sheet for Healthcare Providers:  IncredibleEmployment.be  This test is no t yet approved or cleared by the [place] FDA and  has been authorized for detection and/or diagnosis of SARS-CoV-2 by FDA under an Emergency Use Authorization (EUA). This EUA will remain  in effect (meaning this test can be used) for the duration of the COVID-19 declaration under Section 564(b)(1) of the Act, 21 U.S.C.section 360bbb-3(b)(1), unless the authorization is terminated  or revoked sooner.       Influenza A by PCR NEGATIVE NEGATIVE Final   Influenza B by PCR NEGATIVE NEGATIVE Final    Comment: (NOTE) The Xpert Xpress SARS-CoV-2/FLU/RSV plus assay is intended as an aid in the diagnosis of influenza from Nasopharyngeal swab specimens and should not be  used as a sole basis for treatment. Nasal washings and aspirates are unacceptable for Xpert Xpress SARS-CoV-2/FLU/RSV testing.  Fact Sheet for Patients: EntrepreneurPulse.com.au  Fact Sheet for Healthcare Providers: IncredibleEmployment.be  This test is not yet approved or cleared by the [place] FDA and has been authorized for detection and/or diagnosis of SARS-CoV-2 by FDA under an Emergency Use Authorization (EUA). This EUA  will remain in effect (meaning this test can be used) for the duration of the COVID-19 declaration under Section 564(b)(1) of the Act, 21 U.S.C. section 360bbb-3(b)(1), unless the authorization is terminated or revoked.  Performed at [facility], [place] [address] [place], [place] [address]   Urine Culture     Status: Abnormal   Collection Time: 11/07/20 12:49 PM   Specimen: Urine, Clean Catch  Result Value Ref Range Status   Specimen Description   Final    URINE, CLEAN CATCH Performed at [facility], [place] [address], [place], [place] [address]    Special Requests   Final    NONE Performed at [facility], [place] [address] [place], [place] [address]    Culture >=100,000 COLONIES/mL KLEBSIELLA PNEUMONIAE (A)  Final   Report Status 11/09/2020 FINAL  Final   Organism ID, Bacteria KLEBSIELLA PNEUMONIAE (A)  Final      Susceptibility   Klebsiella pneumoniae - MIC*    AMPICILLIN RESISTANT Resistant     CEFAZOLIN <=4 SENSITIVE Sensitive     CEFEPIME <=0.12 SENSITIVE Sensitive     CEFTRIAXONE <=0.25 SENSITIVE Sensitive     CIPROFLOXACIN <=0.25 SENSITIVE Sensitive     GENTAMICIN <=1 SENSITIVE Sensitive     IMIPENEM <=0.25 SENSITIVE Sensitive     NITROFURANTOIN 128 RESISTANT Resistant     TRIMETH/SULFA <=20 SENSITIVE Sensitive     AMPICILLIN/SULBACTAM <=2 SENSITIVE Sensitive     PIP/TAZO <=4 SENSITIVE Sensitive     * >=100,000 COLONIES/mL KLEBSIELLA PNEUMONIAE   MRSA PCR Screening     Status: None   Collection Time: 11/09/20 11:40 AM   Specimen: Nasal Mucosa; Nasopharyngeal  Result Value Ref Range Status   MRSA by PCR NEGATIVE NEGATIVE Final    Comment:        The GeneXpert MRSA Assay (FDA approved for NASAL specimens only), is one component of a comprehensive MRSA colonization surveillance program. It is not intended to diagnose MRSA infection nor to guide or monitor treatment for MRSA infections. Performed at [facility], [place] [address], [place], [place] [address]   Gastrointestinal Panel by PCR , Stool     Status: None   Collection Time: 11/10/20 12:00 PM   Specimen: Stool  Result Value Ref Range Status   Campylobacter species NOT DETECTED NOT DETECTED Final   Plesimonas shigelloides NOT DETECTED NOT DETECTED Final   Salmonella species NOT DETECTED NOT DETECTED Final   Yersinia enterocolitica NOT DETECTED NOT DETECTED Final   Vibrio species NOT DETECTED NOT DETECTED Final   Vibrio cholerae NOT DETECTED NOT DETECTED Final   Enteroaggregative E coli (EAEC) NOT DETECTED NOT DETECTED Final   Enteropathogenic E coli (EPEC) NOT DETECTED NOT DETECTED Final   Enterotoxigenic E coli (ETEC) NOT DETECTED NOT DETECTED Final   Shiga like toxin producing E coli (STEC) NOT DETECTED NOT DETECTED Final   Shigella/Enteroinvasive E coli (EIEC) NOT DETECTED NOT DETECTED Final   Cryptosporidium NOT DETECTED NOT DETECTED Final   Cyclospora cayetanensis NOT DETECTED NOT DETECTED Final   Entamoeba histolytica NOT DETECTED NOT DETECTED Final   Giardia lamblia NOT DETECTED NOT DETECTED Final   Adenovirus F40/41 NOT DETECTED NOT DETECTED Final   Astrovirus NOT DETECTED NOT DETECTED Final   Norovirus GI/GII NOT DETECTED NOT DETECTED Final   Rotavirus A NOT DETECTED NOT DETECTED Final   Sapovirus (I, II, IV, and V) NOT DETECTED NOT DETECTED Final    Comment: Performed at [facility], [place]  [address], [place],  [address]          Radiology Studies: ECHOCARDIOGRAM COMPLETE  Result Date: 11/12/2020    ECHOCARDIOGRAM REPORT   Patient Name:   Cathy [name] Date of Exam: 11/12/2020 Medical Rec #:  [id number]        Height:       67.0 in Accession #:    [id number]       Weight:       159.4 lb Date of Birth:  1981-11-01       BSA:          1.836 m Patient Age:    39 years         BP:           143/100 mmHg Patient Gender: F                HR:           67 bpm. Exam Location:  Inpatient Procedure: 2D Echo, Cardiac Doppler and Color Doppler Indications:    Pericardial effusion 423.9 / I31.3  History:        Patient has prior history of Echocardiogram examinations, most                 recent 11/10/2020.  Sonographer:    Bernadene Person RDCS Referring Phys: [place]  1. Left ventricular ejection fraction, by estimation, is 55 to 60%. The left ventricle has normal function. The left ventricle has no regional wall motion abnormalities. There is mild left ventricular hypertrophy. Left ventricular diastolic parameters are consistent with Grade II diastolic dysfunction (pseudonormalization).  2. Right ventricular systolic function is normal. The right ventricular size is normal. There is normal pulmonary artery systolic pressure. The estimated right ventricular systolic pressure is 94.7 mmHg.  3. The mitral valve is normal in structure. Mild mitral valve regurgitation. No evidence of mitral stenosis.  4. The aortic valve is tricuspid. Aortic valve regurgitation is trivial. No aortic stenosis is present.  5. The inferior vena cava is normal in size with greater than 50% respiratory variability, suggesting right atrial pressure of 3 mmHg.  6. Moderate pericardial effusion. There is no RV diastolic collapse. There is < 25% respirophasic variation of the mitral E inflow velocity. The IVC is not dilated. No evidence for tamponade. FINDINGS  Left Ventricle: Left ventricular ejection fraction, by estimation, is 55 to  60%. The left ventricle has normal function. The left ventricle has no regional wall motion abnormalities. The left ventricular internal cavity size was normal in size. There is  mild left ventricular hypertrophy. Left ventricular diastolic parameters are consistent with Grade II diastolic dysfunction (pseudonormalization). Right Ventricle: The right ventricular size is normal. No increase in right ventricular wall thickness. Right ventricular systolic function is normal. There is normal pulmonary artery systolic pressure. The tricuspid regurgitant velocity is 2.56 m/s, and  with an assumed right atrial pressure of 3 mmHg, the estimated right ventricular systolic pressure is 65.4 mmHg. Left Atrium: Left atrial size was normal in size. Right Atrium: Right atrial size was normal in size. Pericardium: Moderate pericardial effusion. There is no RV diastolic collapse. There is < 25% respirophasic variation of the mitral E inflow velocity. The IVC is not dilated. No evidence for tamponade. A moderately sized pericardial effusion is present. Mitral Valve: The mitral valve is normal in structure. Mild mitral valve regurgitation. No evidence of mitral valve stenosis. Tricuspid Valve: The tricuspid valve is normal in structure. Tricuspid valve  regurgitation is trivial. Aortic Valve: The aortic valve is tricuspid. Aortic valve regurgitation is trivial. Aortic regurgitation PHT measures 492 msec. No aortic stenosis is present. Pulmonic Valve: The pulmonic valve was normal in structure. Pulmonic valve regurgitation is not visualized. Aorta: The aortic root is normal in size and structure. Venous: The inferior vena cava is normal in size with greater than 50% respiratory variability, suggesting right atrial pressure of 3 mmHg. IAS/Shunts: No atrial level shunt detected by color flow Doppler.  LEFT VENTRICLE PLAX 2D LVIDd:         5.10 cm  Diastology LVIDs:         3.20 cm  LV e' medial:    7.51 cm/s LV PW:         1.20 cm  LV  E/e' medial:  15.3 LV IVS:        0.90 cm  LV e' lateral:   9.79 cm/s LVOT diam:     2.10 cm  LV E/e' lateral: 11.7 LV SV:         109 LV SV Index:   59 LVOT Area:     3.46 cm  RIGHT VENTRICLE RV S prime:     13.50 cm/s TAPSE (M-mode): 2.2 cm LEFT ATRIUM           Index       RIGHT ATRIUM           Index LA diam:      4.20 cm 2.29 cm/m  RA Area:     13.90 cm LA Vol (A2C): 44.0 ml 23.96 ml/m RA Volume:   29.40 ml  16.01 ml/m LA Vol (A4C): 38.9 ml 21.18 ml/m  AORTIC VALVE LVOT Vmax:   136.00 cm/s LVOT Vmean:  98.300 cm/s LVOT VTI:    0.314 m AI PHT:      492 msec  AORTA Ao Root diam: 2.80 cm Ao Asc diam:  3.00 cm MITRAL VALVE                 TRICUSPID VALVE MV Area (PHT): 2.50 cm      TR Peak grad:   26.2 mmHg MV Decel Time: 303 msec      TR Vmax:        256.00 cm/s MR Peak grad:    137.4 mmHg MR Mean grad:    96.0 mmHg   SHUNTS MR Vmax:         586.00 cm/s Systemic VTI:  0.31 m MR Vmean:        468.0 cm/s  Systemic Diam: 2.10 cm MR PISA:         1.01 cm MR PISA Eff ROA: 7 mm MR PISA Radius:  0.40 cm MV E velocity: 115.00 cm/s MV A velocity: 84.80 cm/s MV E/A ratio:  1.36 Loralie Champagne MD Electronically signed by Loralie Champagne MD Signature Date/Time: 11/12/2020/1:28:06 PM    Final         Scheduled Meds: . sodium chloride   Intravenous Once  . amLODipine  10 mg Oral Daily  . cephALEXin  500 mg Oral Q8H  . diltiazem  30 mg Oral Q12H  . famotidine  10 mg Oral Daily  . fenofibrate  160 mg Oral Daily  . guaiFENesin  1,200 mg Oral BID  . heparin  5,000 Units Subcutaneous Q8H  . hydrocortisone cream   Topical BID  . influenza vac split quadrivalent PF  0.5 mL Intramuscular Tomorrow-1000  . labetalol  400 mg Oral BID  .  predniSONE  50 mg Oral Q breakfast  . sodium bicarbonate  1,300 mg Oral BID   Continuous Infusions: . sodium chloride 20 mL/hr at 11/07/20 1545     LOS: 5 days    Time spent: 25 mins.More than 50% of that time was spent in counseling and/or coordination of  care.      Shelly Coss, MD [facility] P12/13/2021, 1:42 PM

## 2020-11-12 NOTE — Progress Notes (Signed)
[facility] NEPHROLOGY PROGRESS NOTE  Physical Exam: General:NAD, comfortable Heart:RRR, s1s2 nl, no rubs appreciated Lungs:clear b/l, no crackle Abdomen:soft, Non-tender, non-distended Extremities: Lower extremity edema improved to 1+ Neurology: Alert, awake, nonfocal, no asterixis   Assessment/ Plan: Pt is a 39 y.o. yo female  with history of hypertension, bipolar disorder, anxiety, diagnosed with lupus nephritis in 2019 when she was pregnant with AKI, nonadherence with outpatient follow-up, presented with generalized body pain, anasarca seen as a consultation for the evaluation of worsening renal failure and fluid overload.  Problems: 1. 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 AKI probably due to worsening lupus nephritis and hypertensive nephrosclerosis.  She has biopsy-proven class III lupus nephritis from 2019.  She lost follow-up and did not take her medication. UA with UTI, proteinuria, 7.4 g.  CT scan of abdomen pelvis ruled out obstruction. Prob has progression of her nephritis and a lot of chronic changes. Ideally, we need to do kidney biopsy to evaluate the lupus nephritis and chronicity, however, given UTI and current infection unable to do kidney biopsy. She is also noncompliant with outpatient follow-ups and treatments. Edema is much better after IV Lasix and albumin, however creat bumped up so holding lasix now.   May be able to start ACE inhibitor or ARB when she has a stable renal function.  May consider kidney biopsy after completion of antibiotics.  - upon discharge recommend po 40mg  lasix 3x per week on M-W-F until renal f/u, give 4- 8 wk supply - have arranged renal f/u w/ Dr Marval Regal see dc section - upon discharge please taper prednisone by 10 mg weekly until off, should take 4-5 wks to taper off. Given her hx of noncompliance a longer pred course is not advised.  - will sign off  2. History of lupus  nephritis class III: She had kidney biopsy in 08/2018 for nephrotic syndrome and AKI which showed focal lupus nephritis mildly active and minimally sclerosing phase.  She was not compliant with outpatient treatment and follow-up.  She was never seen by rheumatologist.  Noted complements level are low and elevated dsDNA, +anti-Smith and centromere Ab. Also with pericardial effusion, question pericarditis, cards evaluating.  Started steroid on 12/11 for general lupus flare.  She feels much better, has less pain.  Continue prednisone 50 mg and taper gradually.  Strongly recommend her to follow-up with rheumatologist after discharge.  3. Hypertension/volume: Blood pressure medication including labetalol, amlodipine recently started.  Diuretics increased as above.  Monitor blood pressure. 4. Metabolic acidosis: Increased sodium bicarbonate dose. 5. Anemia, probably due to lupus: Iron saturation 18% with high ferritin. Received blood transfusion.  No sign of bleeding. 6. Abdomen pain/anasarca/nephrotic syndrome:  7. Pericardial effusion - seen by cardiology.  Treated with diuretics, repeating echo by cardiology pending.  Edema better. Cards hoping effusion will improve w/ SLE Rx (prednisone at this time). Repeat echo no signs of tamponade.   [name], MD 11/12/2020, 2:46 PM       Subjective: seen in room, wants to go home. Creat up 2.8 today from 2.6 yest.    Objective Vital signs in last 24 hours: Vitals:   11/11/20 2040 11/11/20 2248 11/12/20 0542 11/12/20 1324  BP: 132/87 (!) 135/91 (!) 143/100 125/86  Pulse: 73 68 64 63  Resp: 16  16 14   Temp: 97.9 F (36.6 C)  98 F (36.7 C) 97.9 F (36.6 C)  TempSrc: Oral  Oral Oral  SpO2:  97%  100% 99%  Weight:      Height:       Weight change:   Intake/Output Summary (Last 24 hours) at 11/12/2020 1441 Last data filed at 11/12/2020 0551 Gross per 24 hour  Intake 1080 ml  Output 750 ml  Net 330 ml       Labs: Basic Metabolic  Panel: Recent Labs  Lab 11/09/20 0707 11/10/20 0657 11/11/20 0549 11/12/20 0526  NA 137 135 136 137  K 4.6 4.2 4.8 4.9  CL 108 109 108 109  CO2 20* 16* 16* 17*  GLUCOSE 99 115* 154* 113*  BUN 54* 52* 65* 78*  CREATININE 2.21* 2.25* 2.64* 2.88*  CALCIUM 7.0* 7.0* 7.6* 7.4*  PHOS 6.2* 6.2* 6.5*  --    Liver Function Tests: Recent Labs  Lab 11/06/20 1759 11/07/20 1104 11/08/20 0603 11/09/20 0707 11/10/20 0657 11/11/20 0549  AST 22 22  --   --   --   --   ALT 14 14  --   --   --   --   ALKPHOS 84 89  --   --   --   --   BILITOT 0.3 0.4  --   --   --   --   PROT 5.0* 4.8*  --   --   --   --   ALBUMIN 1.6* 1.5*   < > 2.2* 2.0* 3.0*   < > = values in this interval not displayed.   Recent Labs  Lab 11/06/20 1759 11/07/20 1104  LIPASE 54* 51   No results for input(s): AMMONIA in the last 168 hours. CBC: Recent Labs  Lab 11/07/20 1104 11/07/20 2245 11/08/20 0603 11/09/20 0707 11/10/20 0657 11/11/20 0549 11/12/20 0526  WBC 7.2  --  5.7 5.1 4.9 3.6* 8.1  NEUTROABS 5.3  --   --   --   --   --   --   HGB 7.3*   < > 6.9* 8.4* 8.0* 7.9* 7.9*  HCT 22.6*   < > 20.8* 25.2* 24.7* 23.7* 24.4*  MCV 85.6  --  86.3 87.2 88.2 86.5 88.4  PLT 171  --  155 150 165 163 187   < > = values in this interval not displayed.   Cardiac Enzymes: No results for input(s): CKTOTAL, CKMB, CKMBINDEX, TROPONINI in the last 168 hours. CBG: No results for input(s): GLUCAP in the last 168 hours.   Medications: Infusions:  sodium chloride 20 mL/hr at 11/07/20 1545    Scheduled Medications:  sodium chloride   Intravenous Once   amLODipine  10 mg Oral Daily   cephALEXin  500 mg Oral Q8H   diltiazem  30 mg Oral Q12H   famotidine  10 mg Oral Daily   fenofibrate  160 mg Oral Daily   guaiFENesin  1,200 mg Oral BID   heparin  5,000 Units Subcutaneous Q8H   hydrocortisone cream   Topical BID   influenza vac split quadrivalent PF  0.5 mL Intramuscular Tomorrow-1000   labetalol   400 mg Oral BID   predniSONE  50 mg Oral Q breakfast   sodium bicarbonate  1,300 mg Oral BID    have reviewed scheduled and prn medications.    Sandy Salaam Challen Spainhour 11/12/2020,2:41 PM  LOS: 5 days  Pager: [phone]

## 2020-11-12 NOTE — Progress Notes (Signed)
Progress Note  Patient Name: Cathy [name] Date of Encounter: 11/12/2020  [facility] Cardiologist: Kirk Ruths, MD   Subjective   No chest pain or SOB, would love to go home.  Inpatient Medications    Scheduled Meds: . sodium chloride   Intravenous Once  . amLODipine  10 mg Oral Daily  . cephALEXin  500 mg Oral Q8H  . diltiazem  30 mg Oral Q12H  . famotidine  10 mg Oral Daily  . fenofibrate  160 mg Oral Daily  . guaiFENesin  1,200 mg Oral BID  . heparin  5,000 Units Subcutaneous Q8H  . hydrocortisone cream   Topical BID  . influenza vac split quadrivalent PF  0.5 mL Intramuscular Tomorrow-1000  . labetalol  400 mg Oral BID  . predniSONE  50 mg Oral Q breakfast  . sodium bicarbonate  1,300 mg Oral BID   Continuous Infusions: . sodium chloride 20 mL/hr at 11/07/20 1545   PRN Meds: acetaminophen **OR** acetaminophen, HYDROcodone-acetaminophen, menthol-cetylpyridinium, morphine injection, polyethylene glycol, prochlorperazine   Vital Signs    Vitals:   11/11/20 1743 11/11/20 2040 11/11/20 2248 11/12/20 0542  BP: (!) 148/103 132/87 (!) 135/91 (!) 143/100  Pulse: 66 73 68 64  Resp:  16  16  Temp:  97.9 F (36.6 C)  98 F (36.7 C)  TempSrc:  Oral  Oral  SpO2: 100% 97%  100%  Weight:      Height:        Intake/Output Summary (Last 24 hours) at 11/12/2020 1144 Last data filed at 11/12/2020 0551 Gross per 24 hour  Intake 1080 ml  Output 750 ml  Net 330 ml   Last 3 Weights 11/10/2020 11/09/2020 11/08/2020  Weight (lbs) 159 lb 6.3 oz 158 lb 4.6 oz 140 lb  Weight (kg) 72.3 kg 71.8 kg 63.504 kg      Telemetry    Sinus rhythm - Personally Reviewed  ECG    No new EKG to review - Personally Reviewed  Physical Exam   GEN: No acute distress.   Neck: No JVD Cardiac: RRR, 2/6 murmur, no rubs, or gallops.  Respiratory: clear bilaterally w/ few scattered rales GI: Soft, nontender, non-distended  MS: No edema; No deformity. Neuro:  Nonfocal  Psych:  Normal affect   Labs    High Sensitivity Troponin:  No results for input(s): TROPONINIHS in the last 720 hours.    Chemistry Recent Labs  Lab 11/06/20 1759 11/07/20 1104 11/08/20 0603 11/09/20 0707 11/10/20 0657 11/11/20 0549 11/12/20 0526  NA 134* 133*   < > 137 135 136 137  K 5.2* 5.1   < > 4.6 4.2 4.8 4.9  CL 106 107   < > 108 109 108 109  CO2 20* 17*   < > 20* 16* 16* 17*  GLUCOSE 102* 99   < > 99 115* 154* 113*  BUN 54* 53*   < > 54* 52* 65* 78*  CREATININE 2.61* 2.50*   < > 2.21* 2.25* 2.64* 2.88*  CALCIUM 7.1* 6.9*   < > 7.0* 7.0* 7.6* 7.4*  PROT 5.0* 4.8*  --   --   --   --   --   ALBUMIN 1.6* 1.5*   < > 2.2* 2.0* 3.0*  --   AST 22 22  --   --   --   --   --   ALT 14 14  --   --   --   --   --  ALKPHOS 84 89  --   --   --   --   --   BILITOT 0.3 0.4  --   --   --   --   --   GFRNONAA 24* 25*   < > 29* 28* 23* 21*  ANIONGAP 8 9   < > 9 10 12 11    < > = values in this interval not displayed.     Hematology Recent Labs  Lab 11/10/20 0657 11/11/20 0549 11/12/20 0526  WBC 4.9 3.6* 8.1  RBC 2.80* 2.74* 2.76*  HGB 8.0* 7.9* 7.9*  HCT 24.7* 23.7* 24.4*  MCV 88.2 86.5 88.4  MCH 28.6 28.8 28.6  MCHC 32.4 33.3 32.4  RDW 15.5 15.4 15.8*  PLT 165 163 187    BNPNo results for input(s): BNP, PROBNP in the last 168 hours.   DDimer No results for input(s): DDIMER in the last 168 hours.    Radiology    ECHOCARDIOGRAM LIMITED  Result Date: 11/10/2020    ECHOCARDIOGRAM LIMITED REPORT   Patient Name:   ISEL SKUFCA Date of Exam: 11/10/2020 Medical Rec #:  [id number]        Height:       67.0 in Accession #:    [id number]       Weight:       159.4 lb Date of Birth:  [birth year]       BSA:          1.836 m Patient Age:    39 years         BP:           157/113 mmHg Patient Gender: F                HR:           70 bpm. Exam Location:  Inpatient Procedure: Limited Echo, Limited Color Doppler and Cardiac Doppler Indications:    Pericardial effusion 423.9 / I31.3   History:        Patient has prior history of Echocardiogram examinations, most                 recent 11/08/2020. Risk Factors:Hypertension.  Sonographer:    Bernadene Person RDCS Referring Phys: [place]  1. Left ventricular ejection fraction, by estimation, is 60 to 65%. The left ventricle has normal function. The left ventricle has no regional wall motion abnormalities. There is moderate left ventricular hypertrophy.  2. Right ventricular systolic function is mildly reduced. The right ventricular size is mildly enlarged. There is normal pulmonary artery systolic pressure.  3. Large circumferential effusion persists since echo done 11/08/20 IVC less dilated and actually decreases with respiration but mild decriment in mitral inflows with inspiration No RV/RA collapse in diastole Clinicial correlation suggested with possible  need for right heart cath and diagnostic pericardiocentesis given patients diagnosis of Lupus as well as persistence of effusion . Large pericardial effusion. The pericardial effusion is circumferential.  4. The mitral valve is abnormal. Mild mitral valve regurgitation. No evidence of mitral stenosis.  5. The aortic valve is normal in structure. Aortic valve regurgitation is not visualized. No aortic stenosis is present.  6. The inferior vena cava is dilated in size with >50% respiratory variability, suggesting right atrial pressure of 8 mmHg. FINDINGS  Left Ventricle: Left ventricular ejection fraction, by estimation, is 60 to 65%. The left ventricle has normal function. The left ventricle has no regional wall motion abnormalities. The left ventricular  internal cavity size was normal in size. There is  moderate left ventricular hypertrophy. Right Ventricle: The right ventricular size is mildly enlarged. Right vetricular wall thickness was not assessed. Right ventricular systolic function is mildly reduced. There is normal pulmonary artery systolic pressure. The tricuspid  regurgitant velocity is 2.15 m/s, and with an assumed right atrial pressure of 3 mmHg, the estimated right ventricular systolic pressure is 62.7 mmHg. Left Atrium: Left atrial size was normal in size. Right Atrium: Right atrial size was normal in size. Pericardium: Large circumferential effusion persists since echo done 11/08/20 IVC less dilated and actually decreases with respiration but mild decriment in mitral inflows with inspiration No RV/RA collapse in diastole Clinicial correlation suggested with  possible need for right heart cath and diagnostic pericardiocentesis given patients diagnosis of Lupus as well as persistence of effusion. A large pericardial effusion is present. The pericardial effusion is circumferential. Mitral Valve: The mitral valve is abnormal. There is moderate thickening of the mitral valve leaflet(s). There is mild calcification of the mitral valve leaflet(s). Mild mitral valve regurgitation. No evidence of mitral valve stenosis. Tricuspid Valve: The tricuspid valve is normal in structure. Tricuspid valve regurgitation is mild . No evidence of tricuspid stenosis. Aortic Valve: The aortic valve is normal in structure. Aortic valve regurgitation is not visualized. No aortic stenosis is present. Pulmonic Valve: The pulmonic valve was normal in structure. Pulmonic valve regurgitation is not visualized. No evidence of pulmonic stenosis. Aorta: The aortic root is normal in size and structure. Venous: The inferior vena cava is dilated in size with greater than 50% respiratory variability, suggesting right atrial pressure of 8 mmHg. IAS/Shunts: No atrial level shunt detected by color flow Doppler. LEFT VENTRICLE PLAX 2D LVIDd:         5.00 cm LVIDs:         2.90 cm LV PW:         1.60 cm LV IVS:        1.20 cm LVOT diam:     1.70 cm LVOT Area:     2.27 cm  LEFT ATRIUM         Index LA diam:    3.50 cm 1.91 cm/m   AORTA Ao Root diam: 2.70 cm Ao Asc diam:  2.70 cm TRICUSPID VALVE TR Peak grad:    18.5 mmHg TR Vmax:        215.00 cm/s  SHUNTS Systemic Diam: 1.70 cm Jenkins Rouge MD Electronically signed by Jenkins Rouge MD Signature Date/Time: 11/10/2020/2:12:01 PM    Final     Cardiac Studies   Limited 2D echo 11/12/2020 - done, results pending  Limited 2D echo 11/10/2020 IMPRESSIONS  1. Left ventricular ejection fraction, by estimation, is 60 to 65%. The  left ventricle has normal function. The left ventricle has no regional  wall motion abnormalities. There is moderate left ventricular hypertrophy.  2. Right ventricular systolic function is mildly reduced. The right  ventricular size is mildly enlarged. There is normal pulmonary artery  systolic pressure.  3. Large circumferential effusion persists since echo done 11/08/20 IVC less dilated and actually decreases with respiration but mild decriment in mitral inflows with inspiration No RV/RA collapse in diastole Clinicial correlation suggested with possible  need for right heart cath and diagnostic pericardiocentesis given  patients diagnosis of Lupus as well as persistence of effusion . Large pericardial effusion. The pericardial effusion is circumferential.  4. The mitral valve is abnormal. Mild mitral valve regurgitation. No  evidence of mitral  stenosis.  5. The aortic valve is normal in structure. Aortic valve regurgitation is  not visualized. No aortic stenosis is present.  6. The inferior vena cava is dilated in size with >50% respiratory  variability, suggesting right atrial pressure of 8 mmHg.    Echocardiogram 11/08/20: 1. Left ventricular ejection fraction, by estimation, is 60 to 65%. The  left ventricle has normal function. The left ventricle has no regional  wall motion abnormalities. There is severe left ventricular hypertrophy.  2. Right ventricular systolic function is normal. The right ventricular  size is normal. Mildly increased right ventricular wall thickness. There is normal pulmonary artery systolic  pressure.  3. Large pericardial effusion surrounds heart Measures 17 mm in maximal dimension. Mitral inflow is without significant respiratory variation; there is mild RV indentation but no RV collapse But IVC is dilated Does not meet echo criteria for tamponade  CLinical correlation indicated. . Large pericardial effusion. The  pericardial effusion is circumferential.  4. MR is eccentric, directed more posterior into LA. The mitral valve is abnormal. Mild mitral valve regurgitation.  5. The aortic valve is tricuspid. Aortic valve regurgitation is trivial.  Mild aortic valve sclerosis is present, with no evidence of aortic valve  stenosis.   Patient Profile     39 y.o. female with a PMH of HTN, SLE, bioplar disorder, CKD stage 3 likely 2/2 lupus nephritis based on prior kidney biopsy, and hx of substance abuse who was admitted 12/08 with anasarca, AKI, SLE exacerbation, Cards following for pericardial effusion.  Assessment & Plan    1. Pericardial effusion:  - presented w/ nephrotic syndrome, anasarca - Nephrology following, diuretics held 2nd rising Cr - per Dr Theodosia Blender note 12/12, epeat 2D echo showed persistence of large pericardial effusion>>the IVC is less dilated and decreases with respiration but there is now a mild decrement in MV inflow velocities.  There is no RV/RA collapse in diastole. ** May need to consider right heart cath with pericardiocentesis but hoping that treatment of Lupus will slowly resolve effusion - repeat limited echo pending, decide on R heart cath vs outpt f/u today.  2. Chest pain/DOE:  - CP improved w/ steroids - ez not checked - LDL 109, no WMA on echo, aortic atherosclerosis on CT but coronary calcium not mentioned - consider ischemic eval as outpt    3. HTN:  - not on meds as outpt - on amlodipine 10 mg - also labetalol 400 mg bid, discuss changing this w/ MD  4. Anemia:  - H&H 6.2/18.6 on admit >> 8.4/25.2 s/p transfusion - initially trending  down again, but stable over last 2 days - chronic problem, per IM  5. AoCKD stage 2/3:  - BUN/Cr 46/2.26 on admit, 78/2.88 today - Nephrology managing - on steroids  6. Mitral regurgitation:  - seen on echo, follow  7. Lupus:  - on steroids, strongly urged to f/u with Rheumatology - per IM    For questions or updates, please contact [place] Please consult [email or web address] for contact info under        Signed, Rosaria Ferries, PA-C  11/12/2020, 11:44 AM

## 2020-11-13 ENCOUNTER — Other Ambulatory Visit: Payer: Self-pay | Admitting: Physician Assistant

## 2020-11-13 DIAGNOSIS — I3139 Other pericardial effusion (noninflammatory): Secondary | ICD-10-CM

## 2020-11-13 LAB — CBC WITH DIFFERENTIAL/PLATELET
Abs Immature Granulocytes: 0.15 10*3/uL — ABNORMAL HIGH (ref 0.00–0.07)
Basophils Absolute: 0 10*3/uL (ref 0.0–0.1)
Basophils Relative: 0 %
Eosinophils Absolute: 0 10*3/uL (ref 0.0–0.5)
Eosinophils Relative: 0 %
HCT: 23.9 % — ABNORMAL LOW (ref 36.0–46.0)
Hemoglobin: 7.8 g/dL — ABNORMAL LOW (ref 12.0–15.0)
Immature Granulocytes: 2 %
Lymphocytes Relative: 16 %
Lymphs Abs: 1.3 10*3/uL (ref 0.7–4.0)
MCH: 29.1 pg (ref 26.0–34.0)
MCHC: 32.6 g/dL (ref 30.0–36.0)
MCV: 89.2 fL (ref 80.0–100.0)
Monocytes Absolute: 0.5 10*3/uL (ref 0.1–1.0)
Monocytes Relative: 6 %
Neutro Abs: 6.2 10*3/uL (ref 1.7–7.7)
Neutrophils Relative %: 76 %
Platelets: 194 10*3/uL (ref 150–400)
RBC: 2.68 MIL/uL — ABNORMAL LOW (ref 3.87–5.11)
RDW: 15.8 % — ABNORMAL HIGH (ref 11.5–15.5)
WBC: 8.1 10*3/uL (ref 4.0–10.5)
nRBC: 0 % (ref 0.0–0.2)

## 2020-11-13 LAB — BASIC METABOLIC PANEL
Anion gap: 11 (ref 5–15)
BUN: 82 mg/dL — ABNORMAL HIGH (ref 6–20)
CO2: 16 mmol/L — ABNORMAL LOW (ref 22–32)
Calcium: 7.5 mg/dL — ABNORMAL LOW (ref 8.9–10.3)
Chloride: 111 mmol/L (ref 98–111)
Creatinine, Ser: 2.99 mg/dL — ABNORMAL HIGH (ref 0.44–1.00)
GFR, Estimated: 20 mL/min — ABNORMAL LOW (ref >60)
Glucose, Bld: 130 mg/dL — ABNORMAL HIGH (ref 70–99)
Potassium: 4.8 mmol/L (ref 3.5–5.1)
Sodium: 138 mmol/L (ref 135–145)

## 2020-11-13 MED ORDER — LABETALOL HCL 5 MG/ML IV SOLN
10.0000 mg | Freq: Once | INTRAVENOUS | Status: AC
  Administered 2020-11-13: 07:00:00 10 mg via INTRAVENOUS
  Filled 2020-11-13: qty 4

## 2020-11-13 MED ORDER — PREDNISONE 10 MG PO TABS: 10.0000 mg | ORAL_TABLET | Freq: Every day | ORAL | 0 refills | Status: DC

## 2020-11-13 MED ORDER — HYDROCORTISONE 1 % EX CREA: TOPICAL_CREAM | Freq: Two times a day (BID) | CUTANEOUS | 0 refills | Status: DC

## 2020-11-13 MED ORDER — FUROSEMIDE 40 MG PO TABS: 40.0000 mg | ORAL_TABLET | ORAL | 0 refills | Status: DC

## 2020-11-13 MED ORDER — LABETALOL HCL 200 MG PO TABS: 400.0000 mg | ORAL_TABLET | Freq: Two times a day (BID) | ORAL | 3 refills | Status: DC

## 2020-11-13 MED ORDER — FENOFIBRATE 160 MG PO TABS: 160.0000 mg | ORAL_TABLET | Freq: Every day | ORAL | 1 refills | Status: DC

## 2020-11-13 MED ORDER — AMLODIPINE BESYLATE 10 MG PO TABS: 10.0000 mg | ORAL_TABLET | Freq: Every day | ORAL | 1 refills | Status: DC

## 2020-11-13 MED ORDER — POLYETHYLENE GLYCOL 3350 17 G PO PACK: 17.0000 g | PACK | Freq: Every day | ORAL | 0 refills | Status: DC | PRN

## 2020-11-13 MED ORDER — SODIUM BICARBONATE 650 MG PO TABS: 1300.0000 mg | ORAL_TABLET | Freq: Two times a day (BID) | ORAL | 1 refills | Status: DC

## 2020-11-13 MED ORDER — FAMOTIDINE 10 MG PO TABS: 10.0000 mg | ORAL_TABLET | Freq: Every day | ORAL | 1 refills | Status: DC

## 2020-11-13 NOTE — TOC Transition Note (Signed)
Transition of Care [facility]) - CM/SW Discharge Note   Patient Details  Name: CAMEY EDELL MRN: [id number] Date of Birth: 27-Jul-1981  [facility]) CM/SW Contact:  Adarian Bur, Marjie Skiff, RN Phone Number: 11/13/2020, 11:37 AM   Clinical Narrative:     Pt provided with [place] letter for DC medications as she states her Medicaid is no longer active

## 2020-11-13 NOTE — Progress Notes (Signed)
Chaplain engaged in initial visit with Cathy [name].  Chaplain learned that Cathy [name] has about five children.  She stated that she cannot wait to get back to them, especially before the holidays.  She stated that she has been in the hospital for about a week.  Chaplain offered support and blessings on Cathy [name]'s journey home.    Chaplain will follow-up as needed.    11/13/20 1300  Clinical Encounter Type  Visited With Patient  Visit Type Initial

## 2020-11-13 NOTE — Discharge Summary (Signed)
Physician Discharge Summary  Cathy [name] JOA:[id number] DOB: 1980-12-23 DOA: 11/07/2020  PCP: Patient, No Pcp Per  Admit date: 11/07/2020 Discharge date: 11/13/2020  Admitted From: Home Disposition:  Home  Discharge Condition:Stable CODE STATUS:FULL Diet recommendation: Heart Healthy  Brief/Interim Summary:  Patient is a 39 female with history of SLE, lupus nephritis, hypertension, substance abuse, bipolar disorder, depression who presented to the emergency department with complaints of abdominal discomfort, distention, leg swelling.  She was not taking any medications at home for last few days before admission.  Patient was admitted for the management of anasarca.  She was admitted for IV diuretics.  Nephrology and cardiology were following.  Her anasarca has significantly improved with diuresis.  Currently she remains hemodynamically stable.  She has been cleared from nephrology for discharge.  She needs to follow-up with nephrology and rheumatology as an outpatient.  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  History of uncontrolled lupus/lupus nephritis, hypertensive nephrosclerosis.  Has significant proteinuria.  Found to have low levels of complements, double-stranded DNA positive, anti-Smith antibody positive, centromere antibody positive.   Suspected to have progression of nephritis.  Nephrology holding renal biopsy for now due to presence of UTI/recurrent infection. Nephrology aware following.    She will follow-up with nephrology as an outpatient.  Lupus flare: Started on prednisone 50 mg.  Will gradually taper.Patient was previously on hydroxychloroquine, prednisone.  She appears noncompliant with her medications.  She has not made outpatient appointment with rheumatology yet.  Case was discussed with her rheumatologist Dr. Benjamine Mola who recommended outpatient follow-up  Anasarca: She was  started on albumin, IV Lasix .  Anasarca has improved.  She was complaining of abdominal pain on presentation.CT abdomen/pelvis showed marked gastric fold thickening may be due to hyperproteinemia or severe anasarca.  She has significant abdominal distention. Plan is to continue Lasix 3 times a week.  Chronic normocytic anemia: She was given a unit of PRBC during this hospitalization.  Currently hemoglobin stable in the range of 7.  No report of melena or hematochezia.  Check CBC in a week  UTI: Finished the course of antibiotics.  Urine culture showed significant Klebsiella pneumonia.  Hyponatremia: Resolved with IV Lasix.  Pericardial effusion: Patient also found to have pericardial effusion.  Echo ruled out tamponade.  Echo showed ejection fraction of 55 to 60%, grade 2 diastolic dysfunction, moderate pericardial effusion.  Cardiology recommended to repeat echo as an outpatient.  She should follow-up with cardiology as an outpatient.  Non-anion gap metabolic acidosis: Continue sodium bicarb tablets.  Hypertension: Noncompliant with medication.  Continue Norvasc , labetalol  Rash on the buttock/thigh: Continue topical steroid  Lymphadenopathy: As seen on CT abdomen/pelvis.  HIV negative.  Outpatient follow-up recommended.  Elevated triglyceride level: Started on fenofibrate.  Needs to repeat lipid panel in 4 to 6 weeks.   Discharge Diagnoses:  Principal Problem:   Anasarca Active Problems:   Lupus ([facility])   Acute renal failure (ARF) (HCC)   Anemia   Aortic atherosclerosis (HCC)   Abdominal pain   Diarrhea    Discharge Instructions  Discharge Instructions    Diet - low sodium heart healthy   Complete by: As directed    Discharge instructions   Complete by: As directed    1)Please take prescribed medication as instructed. 2)[facility].  Do a CBC and BMP test during the follow-up 3)Follow up with nephrology  and rheumatology as an outpatient.  Name and number the providers have been attached.   Increase activity slowly   Complete by: As directed      Allergies as of 11/13/2020   No Known Allergies     Medication List    STOP taking these medications   azaTHIOprine 50 MG tablet Commonly known as: IMURAN   lurasidone 20 MG Tabs tablet Commonly known as: Latuda   nitrofurantoin (macrocrystal-monohydrate) 100 MG capsule Commonly known as: MACROBID     TAKE these medications   amLODipine 10 MG tablet Commonly known as: NORVASC Take 1 tablet (10 mg total) by mouth daily.   famotidine 10 MG tablet Commonly known as: PEPCID Take 1 tablet (10 mg total) by mouth daily. Start taking on: November 14, 2020 What changed:   medication strength  how much to take  when to take this   fenofibrate 160 MG tablet Take 1 tablet (160 mg total) by mouth daily. Start taking on: November 14, 2020   furosemide 40 MG tablet Commonly known as: Lasix Take 1 tablet (40 mg total) by mouth 3 (three) times a week. Take on Monday,wednesday and friday Start taking on: November 14, 2020   hydrocortisone cream 1 % Apply topically 2 (two) times daily.   labetalol 200 MG tablet Commonly known as: NORMODYNE Take 2 tablets (400 mg total) by mouth 2 (two) times daily.   polyethylene glycol 17 g packet Commonly known as: MIRALAX / GLYCOLAX Take 17 g by mouth daily as needed for mild constipation.   predniSONE 10 MG tablet Commonly known as: DELTASONE Take 1 tablet (10 mg total) by mouth daily with breakfast. Take 5 pills daily for 7 days then 4 pills daily for 7 days then 3 pills daily for 7 days then 2 pills daily for 7 days then 1 pill daily for 7 days then stop What changed:   medication strength  how much to take  additional instructions   sodium bicarbonate 650 MG tablet Take 2 tablets (1,300 mg total) by mouth 2 (two) times daily.       Follow-up Information    Collier Salina,  MD Follow up.   Specialty: Rheumatology Why: for systemic lupus management Contact information: [address] [place] [address] [place] Follow up.   Why: establish care  Contact information: [place] [address] [phone]       Ernest Haber, PA-C Follow up on 11/28/2020.   Specialty: Nephrology Why: see kidney P.A. Zeyfang, for Dr Marval Regal, on December 29th, arrive at 2:15 pm for 2:30 appt. very important visit.  Contact information: [place] [address] [phone]        Beaulah Corin, MD .   Specialty: Psychiatry Contact information: [address] [place] [address] [phone]              No Known Allergies  Consultations:  Nephrology, cardiology   Procedures/Studies: CT ABDOMEN PELVIS WO CONTRAST  Addendum Date: 11/07/2020   ADDENDUM REPORT: 11/07/2020 15:12 ADDENDUM: These results were called by telephone at the time of interpretation on 11/07/2020 at 3:12 pm to provider Lacretia Leigh , who verbally acknowledged these results. Electronically Signed   By: Zetta Bills M.D.   On: 11/07/2020 15:12   Result Date: 11/07/2020 CLINICAL DATA:  Abdominal distension, throat burning and cough EXAM: CT ABDOMEN AND PELVIS WITHOUT CONTRAST  TECHNIQUE: Multidetector CT imaging of the abdomen and pelvis was performed following the standard protocol without IV contrast. COMPARISON:  CT angiography of the chest of February 01, 2019 FINDINGS: Lower chest: Cardiomegaly. Heart is incompletely imaged. Volume loss in the LEFT lung base. Moderate pericardial effusion with low-attenuation of cardiac blood pool compatible with anemia. Hepatobiliary: Liver with normal contour and size. No gallbladder distension. Pancreas: Normal contour of the pancreas. Generalized stranding throughout the abdomen not concentrated in the area of the pancreas in seen in  the setting of anasarca is of uncertain significance. Spleen: Spleen normal size.  Normal contour of the spleen. Adrenals/Urinary Tract: Adrenal glands normal. Bilateral perinephric stranding. No signs of hydronephrosis. No radiopaque calculi. Urinary bladder under distended limiting assessment. Stomach/Bowel: Fold thickening, a very pronounced fold thickening in the stomach. Generalized edema in the small bowel mesentery. Haustral thickening throughout the entire colon. Bowel edema in the small bowel without focal abnormality. No sign of bowel obstruction. Dilute contrast extends into the colon, through the small bowel. Appendix is normal Vascular/Lymphatic: No aneurysmal dilation of the abdominal aorta. Smooth contour of the IVC. 17 mm LEFT para-aortic lymph node. No additional adenopathy visualized in the upper abdomen though there is extensive edema which limits assessment. Anasarca blurs boundaries between fat, solid and hollow viscera in the abdomen and musculoskeletal structures in the body wall. Enlarged lymph nodes about the bilateral inguinal regions and in the pelvis (image 68, series 2) 1.4 cm LEFT external iliac lymph node and generalized nodal enlargement with similar size in the bilateral external iliac chains and along is the RIGHT and LEFT groin. Reproductive: Bilateral tubal ligation clips. No adnexal masses. Limited assessment on noncontrast imaging. Other: Small volume ascites in the pelvis.  No free air. Musculoskeletal: No acute bone finding. No destructive bone process. IMPRESSION: 1. Anasarca with signs of anemia. 2. Marked gastric fold thickening may be due to hyperproteinemia or severe anasarca. Correlate with symptoms of gastritis and consider endoscopic assessment for further evaluation. 3. Haustral thickening of the colon also may be related to anasarca, correlate with any symptoms of colitis. Would also consider correlation with lactate though again findings are diffuse, generalized in  seen in the setting of anasarca. 4. Generalized nodal enlargement seen mainly in the pelvis findings are nonspecific, raising the question of lymphoproliferative disorder and not well evaluated given lack of contrast and diffuse marked anasarca. Would also correlate with processes such as HIV infection which could cause generalized nodal enlargement. 5. Bilateral perinephric stranding. Seen in the setting of anasarca without signs of hydronephrosis. 6. Lingular consolidative changes partially visualized may simply be due to volume loss though are not well assessed. Consider follow-up to ensure resolution. Correlate with any respiratory symptoms. 7. Aortic atherosclerosis. Aortic Atherosclerosis (ICD10-I70.0). Electronically Signed: By: Zetta Bills M.D. On: 11/07/2020 15:04   DG Chest Port 1 View  Result Date: 11/07/2020 CLINICAL DATA:  Shortness of breath, cough EXAM: PORTABLE CHEST 1 VIEW COMPARISON:  02/01/2019 FINDINGS: Cardiomegaly. No confluent opacities or effusions. No edema. No acute bony abnormality. IMPRESSION: Cardiomegaly.  No active disease. Electronically Signed   By: Rolm Baptise M.D.   On: 11/07/2020 11:53   ECHOCARDIOGRAM COMPLETE  Result Date: 11/12/2020    ECHOCARDIOGRAM REPORT   Patient Name:   Cathy [name] Date of Exam: 11/12/2020 Medical Rec #:  [id number]        Height:       67.0 in Accession #:    [id number]  Weight:       159.4 lb Date of Birth:  [birth year]       BSA:          1.836 m Patient Age:    39 years         BP:           143/100 mmHg Patient Gender: F                HR:           67 bpm. Exam Location:  Inpatient Procedure: 2D Echo, Cardiac Doppler and Color Doppler Indications:    Pericardial effusion 423.9 / I31.3  History:        Patient has prior history of Echocardiogram examinations, most                 recent 11/10/2020.  Sonographer:    Bernadene Person RDCS Referring Phys: [place]  1. Left ventricular ejection fraction, by  estimation, is 55 to 60%. The left ventricle has normal function. The left ventricle has no regional wall motion abnormalities. There is mild left ventricular hypertrophy. Left ventricular diastolic parameters are consistent with Grade II diastolic dysfunction (pseudonormalization).  2. Right ventricular systolic function is normal. The right ventricular size is normal. There is normal pulmonary artery systolic pressure. The estimated right ventricular systolic pressure is 95.2 mmHg.  3. The mitral valve is normal in structure. Mild mitral valve regurgitation. No evidence of mitral stenosis.  4. The aortic valve is tricuspid. Aortic valve regurgitation is trivial. No aortic stenosis is present.  5. The inferior vena cava is normal in size with greater than 50% respiratory variability, suggesting right atrial pressure of 3 mmHg.  6. Moderate pericardial effusion. There is no RV diastolic collapse. There is < 25% respirophasic variation of the mitral E inflow velocity. The IVC is not dilated. No evidence for tamponade. FINDINGS  Left Ventricle: Left ventricular ejection fraction, by estimation, is 55 to 60%. The left ventricle has normal function. The left ventricle has no regional wall motion abnormalities. The left ventricular internal cavity size was normal in size. There is  mild left ventricular hypertrophy. Left ventricular diastolic parameters are consistent with Grade II diastolic dysfunction (pseudonormalization). Right Ventricle: The right ventricular size is normal. No increase in right ventricular wall thickness. Right ventricular systolic function is normal. There is normal pulmonary artery systolic pressure. The tricuspid regurgitant velocity is 2.56 m/s, and  with an assumed right atrial pressure of 3 mmHg, the estimated right ventricular systolic pressure is 84.1 mmHg. Left Atrium: Left atrial size was normal in size. Right Atrium: Right atrial size was normal in size. Pericardium: Moderate pericardial  effusion. There is no RV diastolic collapse. There is < 25% respirophasic variation of the mitral E inflow velocity. The IVC is not dilated. No evidence for tamponade. A moderately sized pericardial effusion is present. Mitral Valve: The mitral valve is normal in structure. Mild mitral valve regurgitation. No evidence of mitral valve stenosis. Tricuspid Valve: The tricuspid valve is normal in structure. Tricuspid valve regurgitation is trivial. Aortic Valve: The aortic valve is tricuspid. Aortic valve regurgitation is trivial. Aortic regurgitation PHT measures 492 msec. No aortic stenosis is present. Pulmonic Valve: The pulmonic valve was normal in structure. Pulmonic valve regurgitation is not visualized. Aorta: The aortic root is normal in size and structure. Venous: The inferior vena cava is normal in size with greater than 50% respiratory variability, suggesting right atrial pressure of 3  mmHg. IAS/Shunts: No atrial level shunt detected by color flow Doppler.  LEFT VENTRICLE PLAX 2D LVIDd:         5.10 cm  Diastology LVIDs:         3.20 cm  LV e' medial:    7.51 cm/s LV PW:         1.20 cm  LV E/e' medial:  15.3 LV IVS:        0.90 cm  LV e' lateral:   9.79 cm/s LVOT diam:     2.10 cm  LV E/e' lateral: 11.7 LV SV:         109 LV SV Index:   59 LVOT Area:     3.46 cm  RIGHT VENTRICLE RV S prime:     13.50 cm/s TAPSE (M-mode): 2.2 cm LEFT ATRIUM           Index       RIGHT ATRIUM           Index LA diam:      4.20 cm 2.29 cm/m  RA Area:     13.90 cm LA Vol (A2C): 44.0 ml 23.96 ml/m RA Volume:   29.40 ml  16.01 ml/m LA Vol (A4C): 38.9 ml 21.18 ml/m  AORTIC VALVE LVOT Vmax:   136.00 cm/s LVOT Vmean:  98.300 cm/s LVOT VTI:    0.314 m AI PHT:      492 msec  AORTA Ao Root diam: 2.80 cm Ao Asc diam:  3.00 cm MITRAL VALVE                 TRICUSPID VALVE MV Area (PHT): 2.50 cm      TR Peak grad:   26.2 mmHg MV Decel Time: 303 msec      TR Vmax:        256.00 cm/s MR Peak grad:    137.4 mmHg MR Mean grad:    96.0  mmHg   SHUNTS MR Vmax:         586.00 cm/s Systemic VTI:  0.31 m MR Vmean:        468.0 cm/s  Systemic Diam: 2.10 cm MR PISA:         1.01 cm MR PISA Eff ROA: 7 mm MR PISA Radius:  0.40 cm MV E velocity: 115.00 cm/s MV A velocity: 84.80 cm/s MV E/A ratio:  1.36 Loralie Champagne MD Electronically signed by Loralie Champagne MD Signature Date/Time: 11/12/2020/1:28:06 PM    Final    ECHOCARDIOGRAM COMPLETE  Result Date: 11/08/2020    ECHOCARDIOGRAM REPORT   Patient Name:   Cathy [name] Date of Exam: 11/08/2020 Medical Rec #:  [id number]        Height:       67.0 in Accession #:    [id number]       Weight:       143.0 lb Date of Birth:  Apr 14, 1981       BSA:          1.754 m Patient Age:    39 years         BP:           163/109 mmHg Patient Gender: F                HR:           75 bpm. Exam Location:  Inpatient Procedure: 2D Echo, 3D Echo, Cardiac Doppler and Color Doppler REPORT CONTAINS CRITICAL RESULT Indications:    Anasarca  History:        Patient has prior history of Echocardiogram examinations, most                 recent 02/02/2019. Risk Factors:Hypertension. Substance abuse.                 Pulmonary edema. Renal failure. Lupus.  Sonographer:    Roseanna Rainbow RDCS Referring Phys: [id number] [place]  1. Left ventricular ejection fraction, by estimation, is 60 to 65%. The left ventricle has normal function. The left ventricle has no regional wall motion abnormalities. There is severe left ventricular hypertrophy.  2. Right ventricular systolic function is normal. The right ventricular size is normal. Mildly increased right ventricular wall thickness. There is normal pulmonary artery systolic pressure.  3. Large pericardial effusion surrounds heart Measures 17 mm in maximal dimension. Mitral inflow is without significant respiratory variation; there is mild RV indentation but no RV collapse But IVC is dilated Does not meet echo criteria for tamponade CLinical correlation indicated. . Large  pericardial effusion. The pericardial effusion is circumferential.  4. MR is eccentric, directed more posterior into LA. The mitral valve is abnormal. Mild mitral valve regurgitation.  5. The aortic valve is tricuspid. Aortic valve regurgitation is trivial. Mild aortic valve sclerosis is present, with no evidence of aortic valve stenosis. FINDINGS  Left Ventricle: Left ventricular ejection fraction, by estimation, is 60 to 65%. The left ventricle has normal function. The left ventricle has no regional wall motion abnormalities. The left ventricular internal cavity size was normal in size. There is  severe left ventricular hypertrophy. Right Ventricle: The right ventricular size is normal. Mildly increased right ventricular wall thickness. Right ventricular systolic function is normal. There is normal pulmonary artery systolic pressure. The tricuspid regurgitant velocity is 2.39 m/s, and with an assumed right atrial pressure of 3 mmHg, the estimated right ventricular systolic pressure is 26.2 mmHg. Left Atrium: Left atrial size was normal in size. Right Atrium: Right atrial size was normal in size. Pericardium: Large pericardial effusion surrounds heart Measures 17 mm in maximal dimension. Mitral inflow is without significant respiratory variation; there is mild RV indentation but no RV collapse But IVC is dilated Does not meet echo criteria for tamponade CLinical correlation indicated. A large pericardial effusion is present. The pericardial effusion is circumferential. Mitral Valve: MR is eccentric, directed more posterior into LA. The mitral valve is abnormal. There is mild thickening of the mitral valve leaflet(s). Mild mitral valve regurgitation. Tricuspid Valve: The tricuspid valve is normal in structure. Tricuspid valve regurgitation is not demonstrated. Aortic Valve: The aortic valve is tricuspid. Aortic valve regurgitation is trivial. Mild aortic valve sclerosis is present, with no evidence of aortic valve  stenosis. Aortic valve mean gradient measures 7.0 mmHg. Aortic valve peak gradient measures 14.9 mmHg. Aortic valve area, by VTI measures 3.44 cm. Pulmonic Valve: The pulmonic valve was grossly normal. Pulmonic valve regurgitation is trivial. Aorta: The aortic root is normal in size and structure. IAS/Shunts: The interatrial septum was not assessed. Additional Comments: Mild ascites is present.  LEFT VENTRICLE PLAX 2D LVIDd:         4.20 cm      Diastology LVIDs:         2.80 cm      LV e' medial:    13.20 cm/s LV PW:         2.10 cm      LV E/e' medial:  9.9 LV IVS:  1.50 cm      LV e' lateral:   12.10 cm/s LVOT diam:     2.10 cm      LV E/e' lateral: 10.8 LV SV:         125 LV SV Index:   71 LVOT Area:     3.46 cm  LV Volumes (MOD) LV vol d, MOD A2C: 105.0 ml LV vol d, MOD A4C: 118.0 ml LV vol s, MOD A2C: 32.1 ml LV vol s, MOD A4C: 43.8 ml LV SV MOD A2C:     72.9 ml LV SV MOD A4C:     118.0 ml LV SV MOD BP:      73.2 ml RIGHT VENTRICLE             IVC RV S prime:     17.50 cm/s  IVC diam: 2.70 cm TAPSE (M-mode): 3.7 cm LEFT ATRIUM             Index       RIGHT ATRIUM           Index LA diam:        4.00 cm 2.28 cm/m  RA Area:     21.00 cm LA Vol (A2C):   49.3 ml 28.11 ml/m RA Volume:   63.60 ml  36.27 ml/m LA Vol (A4C):   56.0 ml 31.94 ml/m LA Biplane Vol: 52.5 ml 29.94 ml/m  AORTIC VALVE                    PULMONIC VALVE AV Area (Vmax):    3.09 cm     PR End Diast Vel: 2.28 msec AV Area (Vmean):   3.18 cm AV Area (VTI):     3.44 cm AV Vmax:           193.00 cm/s AV Vmean:          121.000 cm/s AV VTI:            0.362 m AV Peak Grad:      14.9 mmHg AV Mean Grad:      7.0 mmHg LVOT Vmax:         172.00 cm/s LVOT Vmean:        111.000 cm/s LVOT VTI:          0.360 m LVOT/AV VTI ratio: 0.99  AORTA Ao Root diam: 3.20 cm MITRAL VALVE                 TRICUSPID VALVE MV Area (PHT): 3.37 cm      TR Peak grad:   22.8 mmHg MV Decel Time: 225 msec      TR Vmax:        239.00 cm/s MR Peak grad:    113.6  mmHg MR Mean grad:    67.0 mmHg   SHUNTS MR Vmax:         533.00 cm/s Systemic VTI:  0.36 m MR Vmean:        381.0 cm/s  Systemic Diam: 2.10 cm MR PISA:         2.26 cm MR PISA Eff ROA: 16 mm MR PISA Radius:  0.60 cm MV E velocity: 131.00 cm/s MV A velocity: 103.00 cm/s MV E/A ratio:  1.27 Dorris Carnes MD Electronically signed by Dorris Carnes MD Signature Date/Time: 11/08/2020/7:14:42 PM    Final    ECHOCARDIOGRAM LIMITED  Result Date: 11/10/2020    ECHOCARDIOGRAM LIMITED REPORT   Patient Name:   Cathy [name]  Date of Exam: 11/10/2020 Medical Rec #:  [id number]        Height:       67.0 in Accession #:    [id number]       Weight:       159.4 lb Date of Birth:  01/12/1981       BSA:          1.836 m Patient Age:    39 years         BP:           157/113 mmHg Patient Gender: F                HR:           70 bpm. Exam Location:  Inpatient Procedure: Limited Echo, Limited Color Doppler and Cardiac Doppler Indications:    Pericardial effusion 423.9 / I31.3  History:        Patient has prior history of Echocardiogram examinations, most                 recent 11/08/2020. Risk Factors:Hypertension.  Sonographer:    Bernadene Person RDCS Referring Phys: [place]  1. Left ventricular ejection fraction, by estimation, is 60 to 65%. The left ventricle has normal function. The left ventricle has no regional wall motion abnormalities. There is moderate left ventricular hypertrophy.  2. Right ventricular systolic function is mildly reduced. The right ventricular size is mildly enlarged. There is normal pulmonary artery systolic pressure.  3. Large circumferential effusion persists since echo done 11/08/20 IVC less dilated and actually decreases with respiration but mild decriment in mitral inflows with inspiration No RV/RA collapse in diastole Clinicial correlation suggested with possible  need for right heart cath and diagnostic pericardiocentesis given patients diagnosis of Lupus as well as persistence  of effusion . Large pericardial effusion. The pericardial effusion is circumferential.  4. The mitral valve is abnormal. Mild mitral valve regurgitation. No evidence of mitral stenosis.  5. The aortic valve is normal in structure. Aortic valve regurgitation is not visualized. No aortic stenosis is present.  6. The inferior vena cava is dilated in size with >50% respiratory variability, suggesting right atrial pressure of 8 mmHg. FINDINGS  Left Ventricle: Left ventricular ejection fraction, by estimation, is 60 to 65%. The left ventricle has normal function. The left ventricle has no regional wall motion abnormalities. The left ventricular internal cavity size was normal in size. There is  moderate left ventricular hypertrophy. Right Ventricle: The right ventricular size is mildly enlarged. Right vetricular wall thickness was not assessed. Right ventricular systolic function is mildly reduced. There is normal pulmonary artery systolic pressure. The tricuspid regurgitant velocity is 2.15 m/s, and with an assumed right atrial pressure of 3 mmHg, the estimated right ventricular systolic pressure is 65.4 mmHg. Left Atrium: Left atrial size was normal in size. Right Atrium: Right atrial size was normal in size. Pericardium: Large circumferential effusion persists since echo done 11/08/20 IVC less dilated and actually decreases with respiration but mild decriment in mitral inflows with inspiration No RV/RA collapse in diastole Clinicial correlation suggested with  possible need for right heart cath and diagnostic pericardiocentesis given patients diagnosis of Lupus as well as persistence of effusion. A large pericardial effusion is present. The pericardial effusion is circumferential. Mitral Valve: The mitral valve is abnormal. There is moderate thickening of the mitral valve leaflet(s). There is mild calcification of the mitral valve leaflet(s). Mild mitral valve regurgitation. No evidence of mitral  valve stenosis.  Tricuspid Valve: The tricuspid valve is normal in structure. Tricuspid valve regurgitation is mild . No evidence of tricuspid stenosis. Aortic Valve: The aortic valve is normal in structure. Aortic valve regurgitation is not visualized. No aortic stenosis is present. Pulmonic Valve: The pulmonic valve was normal in structure. Pulmonic valve regurgitation is not visualized. No evidence of pulmonic stenosis. Aorta: The aortic root is normal in size and structure. Venous: The inferior vena cava is dilated in size with greater than 50% respiratory variability, suggesting right atrial pressure of 8 mmHg. IAS/Shunts: No atrial level shunt detected by color flow Doppler. LEFT VENTRICLE PLAX 2D LVIDd:         5.00 cm LVIDs:         2.90 cm LV PW:         1.60 cm LV IVS:        1.20 cm LVOT diam:     1.70 cm LVOT Area:     2.27 cm  LEFT ATRIUM         Index LA diam:    3.50 cm 1.91 cm/m   AORTA Ao Root diam: 2.70 cm Ao Asc diam:  2.70 cm TRICUSPID VALVE TR Peak grad:   18.5 mmHg TR Vmax:        215.00 cm/s  SHUNTS Systemic Diam: 1.70 cm Jenkins Rouge MD Electronically signed by Jenkins Rouge MD Signature Date/Time: 11/10/2020/2:12:01 PM    Final        Subjective: Patient seen and examined at the bedside this morning.  Hemodynamically stable for discharge.  Discharge Exam: Vitals:   11/12/20 2114 11/13/20 0559  BP: (!) 134/95 (!) 150/103  Pulse: 66 (!) 59  Resp: 15 17  Temp: 98 F (36.7 C) 98 F (36.7 C)  SpO2: 100% 100%   Vitals:   11/12/20 0542 11/12/20 1324 11/12/20 2114 11/13/20 0559  BP: (!) 143/100 125/86 (!) 134/95 (!) 150/103  Pulse: 64 63 66 (!) 59  Resp: 16 14 15 17   Temp: 98 F (36.7 C) 97.9 F (36.6 C) 98 F (36.7 C) 98 F (36.7 C)  TempSrc: Oral Oral Oral Oral  SpO2: 100% 99% 100% 100%  Weight:    76 kg  Height:        General: Pt is alert, awake, not in acute distress Cardiovascular: RRR, S1/S2 +, no rubs, no gallops Respiratory: CTA bilaterally, no wheezing, no  rhonchi Abdominal: Soft, NT, distended, bowel sounds + Extremities: trace lower extremity  edema, no cyanosis    The results of significant diagnostics from this hospitalization (including imaging, microbiology, ancillary and laboratory) are listed below for reference.     Microbiology: Recent Results (from the past 240 hour(s))  Resp Panel by RT-PCR (Flu A&B, Covid) Nasopharyngeal Swab     Status: None   Collection Time: 11/07/20 11:06 AM   Specimen: Nasopharyngeal Swab; Nasopharyngeal(NP) swabs in vial transport medium  Result Value Ref Range Status   SARS Coronavirus 2 by RT PCR NEGATIVE NEGATIVE Final    Comment: (NOTE) SARS-CoV-2 target nucleic acids are NOT DETECTED.  The SARS-CoV-2 RNA is generally detectable in upper respiratory specimens during the acute phase of infection. The lowest concentration of SARS-CoV-2 viral copies this assay can detect is 138 copies/mL. A negative result does not preclude SARS-Cov-2 infection and should not be used as the sole basis for treatment or other patient management decisions. A negative result may occur with  improper specimen collection/handling, submission of specimen other than nasopharyngeal swab, presence  of viral mutation(s) within the areas targeted by this assay, and inadequate number of viral copies(<138 copies/mL). A negative result must be combined with clinical observations, patient history, and epidemiological information. The expected result is Negative.  Fact Sheet for Patients:  EntrepreneurPulse.com.au  Fact Sheet for Healthcare Providers:  IncredibleEmployment.be  This test is no t yet approved or cleared by the [place] FDA and  has been authorized for detection and/or diagnosis of SARS-CoV-2 by FDA under an Emergency Use Authorization (EUA). This EUA will remain  in effect (meaning this test can be used) for the duration of the COVID-19 declaration under Section  564(b)(1) of the Act, 21 U.S.C.section 360bbb-3(b)(1), unless the authorization is terminated  or revoked sooner.       Influenza A by PCR NEGATIVE NEGATIVE Final   Influenza B by PCR NEGATIVE NEGATIVE Final    Comment: (NOTE) The Xpert Xpress SARS-CoV-2/FLU/RSV plus assay is intended as an aid in the diagnosis of influenza from Nasopharyngeal swab specimens and should not be used as a sole basis for treatment. Nasal washings and aspirates are unacceptable for Xpert Xpress SARS-CoV-2/FLU/RSV testing.  Fact Sheet for Patients: EntrepreneurPulse.com.au  Fact Sheet for Healthcare Providers: IncredibleEmployment.be  This test is not yet approved or cleared by the [place] FDA and has been authorized for detection and/or diagnosis of SARS-CoV-2 by FDA under an Emergency Use Authorization (EUA). This EUA will remain in effect (meaning this test can be used) for the duration of the COVID-19 declaration under Section 564(b)(1) of the Act, 21 U.S.C. section 360bbb-3(b)(1), unless the authorization is terminated or revoked.  Performed at [facility], [place] [address], [place], [place] [address]   Urine Culture     Status: Abnormal   Collection Time: 11/07/20 12:49 PM   Specimen: Urine, Clean Catch  Result Value Ref Range Status   Specimen Description   Final    URINE, CLEAN CATCH Performed at [facility], [place] [address], [place], [place] [address]    Special Requests   Final    NONE Performed at [facility], [place] [address] [place], [place] [address]    Culture >=100,000 COLONIES/mL KLEBSIELLA PNEUMONIAE (A)  Final   Report Status 11/09/2020 FINAL  Final   Organism ID, Bacteria KLEBSIELLA PNEUMONIAE (A)  Final      Susceptibility   Klebsiella pneumoniae - MIC*    AMPICILLIN RESISTANT Resistant     CEFAZOLIN <=4 SENSITIVE Sensitive     CEFEPIME <=0.12 SENSITIVE Sensitive      CEFTRIAXONE <=0.25 SENSITIVE Sensitive     CIPROFLOXACIN <=0.25 SENSITIVE Sensitive     GENTAMICIN <=1 SENSITIVE Sensitive     IMIPENEM <=0.25 SENSITIVE Sensitive     NITROFURANTOIN 128 RESISTANT Resistant     TRIMETH/SULFA <=20 SENSITIVE Sensitive     AMPICILLIN/SULBACTAM <=2 SENSITIVE Sensitive     PIP/TAZO <=4 SENSITIVE Sensitive     * >=100,000 COLONIES/mL KLEBSIELLA PNEUMONIAE  MRSA PCR Screening     Status: None   Collection Time: 11/09/20 11:40 AM   Specimen: Nasal Mucosa; Nasopharyngeal  Result Value Ref Range Status   MRSA by PCR NEGATIVE NEGATIVE Final    Comment:        The GeneXpert MRSA Assay (FDA approved for NASAL specimens only), is one component of a comprehensive MRSA colonization surveillance program. It is not intended to diagnose MRSA infection nor to guide or monitor treatment for MRSA infections. Performed at [facility], [place] [address]  [place], [place] [address]   Gastrointestinal Panel by PCR , Stool     Status: None   Collection Time: 11/10/20 12:00 PM   Specimen: Stool  Result Value Ref Range Status   Campylobacter species NOT DETECTED NOT DETECTED Final   Plesimonas shigelloides NOT DETECTED NOT DETECTED Final   Salmonella species NOT DETECTED NOT DETECTED Final   Yersinia enterocolitica NOT DETECTED NOT DETECTED Final   Vibrio species NOT DETECTED NOT DETECTED Final   Vibrio cholerae NOT DETECTED NOT DETECTED Final   Enteroaggregative E coli (EAEC) NOT DETECTED NOT DETECTED Final   Enteropathogenic E coli (EPEC) NOT DETECTED NOT DETECTED Final   Enterotoxigenic E coli (ETEC) NOT DETECTED NOT DETECTED Final   Shiga like toxin producing E coli (STEC) NOT DETECTED NOT DETECTED Final   Shigella/Enteroinvasive E coli (EIEC) NOT DETECTED NOT DETECTED Final   Cryptosporidium NOT DETECTED NOT DETECTED Final   Cyclospora cayetanensis NOT DETECTED NOT DETECTED Final   Entamoeba histolytica NOT DETECTED NOT DETECTED Final   Giardia  lamblia NOT DETECTED NOT DETECTED Final   Adenovirus F40/41 NOT DETECTED NOT DETECTED Final   Astrovirus NOT DETECTED NOT DETECTED Final   Norovirus GI/GII NOT DETECTED NOT DETECTED Final   Rotavirus A NOT DETECTED NOT DETECTED Final   Sapovirus (I, II, IV, and V) NOT DETECTED NOT DETECTED Final    Comment: Performed at [facility], [place] [place], [place] [address]     Labs: BNP (last 3 results) No results for input(s): BNP in the last 8760 hours. Basic Metabolic Panel: Recent Labs  Lab 11/09/20 0707 11/10/20 0657 11/11/20 0549 11/12/20 0526 11/13/20 0548  NA 137 135 136 137 138  K 4.6 4.2 4.8 4.9 4.8  CL 108 109 108 109 111  CO2 20* 16* 16* 17* 16*  GLUCOSE 99 115* 154* 113* 130*  BUN 54* 52* 65* 78* 82*  CREATININE 2.21* 2.25* 2.64* 2.88* 2.99*  CALCIUM 7.0* 7.0* 7.6* 7.4* 7.5*  PHOS 6.2* 6.2* 6.5*  --   --    Liver Function Tests: Recent Labs  Lab 11/06/20 1759 11/07/20 1104 11/08/20 0603 11/09/20 0707 11/10/20 0657 11/11/20 0549  AST 22 22  --   --   --   --   ALT 14 14  --   --   --   --   ALKPHOS 84 89  --   --   --   --   BILITOT 0.3 0.4  --   --   --   --   PROT 5.0* 4.8*  --   --   --   --   ALBUMIN 1.6* 1.5* 2.3* 2.2* 2.0* 3.0*   Recent Labs  Lab 11/06/20 1759 11/07/20 1104  LIPASE 54* 51   No results for input(s): AMMONIA in the last 168 hours. CBC: Recent Labs  Lab 11/07/20 1104 11/07/20 2245 11/09/20 0707 11/10/20 0657 11/11/20 0549 11/12/20 0526 11/13/20 0548  WBC 7.2   < > 5.1 4.9 3.6* 8.1 8.1  NEUTROABS 5.3  --   --   --   --   --  6.2  HGB 7.3*   < > 8.4* 8.0* 7.9* 7.9* 7.8*  HCT 22.6*   < > 25.2* 24.7* 23.7* 24.4* 23.9*  MCV 85.6   < > 87.2 88.2 86.5 88.4 89.2  PLT 171   < > 150 165 163 187 194   < > = values in this interval not displayed.   Cardiac Enzymes: No results for  input(s): CKTOTAL, CKMB, CKMBINDEX, TROPONINI in the last 168 hours. BNP: Invalid input(s): POCBNP CBG: No results for input(s):  GLUCAP in the last 168 hours. D-Dimer No results for input(s): DDIMER in the last 72 hours. Hgb A1c No results for input(s): HGBA1C in the last 72 hours. Lipid Profile Recent Labs    11/12/20 0526  CHOL 299*  HDL 23*  LDLCALC UNABLE TO CALCULATE IF TRIGLYCERIDE OVER 400 mg/dL  TRIG 568*  CHOLHDL 13.0  LDLDIRECT 154.7*   Thyroid function studies No results for input(s): TSH, T4TOTAL, T3FREE, THYROIDAB in the last 72 hours.  Invalid input(s): FREET3 Anemia work up No results for input(s): VITAMINB12, FOLATE, FERRITIN, TIBC, IRON, RETICCTPCT in the last 72 hours. Urinalysis    Component Value Date/Time   COLORURINE YELLOW 11/08/2020 1237   APPEARANCEUR CLOUDY (A) 11/08/2020 1237   LABSPEC 1.015 11/08/2020 1237   PHURINE 5.0 11/08/2020 1237   GLUCOSEU NEGATIVE 11/08/2020 1237   HGBUR MODERATE (A) 11/08/2020 1237   BILIRUBINUR NEGATIVE 11/08/2020 1237   KETONESUR NEGATIVE 11/08/2020 1237   PROTEINUR >=300 (A) 11/08/2020 1237   UROBILINOGEN 0.2 10/17/2020 1026   NITRITE NEGATIVE 11/08/2020 1237   LEUKOCYTESUR SMALL (A) 11/08/2020 1237   Sepsis Labs Invalid input(s): PROCALCITONIN,  WBC,  LACTICIDVEN Microbiology Recent Results (from the past 240 hour(s))  Resp Panel by RT-PCR (Flu A&B, Covid) Nasopharyngeal Swab     Status: None   Collection Time: 11/07/20 11:06 AM   Specimen: Nasopharyngeal Swab; Nasopharyngeal(NP) swabs in vial transport medium  Result Value Ref Range Status   SARS Coronavirus 2 by RT PCR NEGATIVE NEGATIVE Final    Comment: (NOTE) SARS-CoV-2 target nucleic acids are NOT DETECTED.  The SARS-CoV-2 RNA is generally detectable in upper respiratory specimens during the acute phase of infection. The lowest concentration of SARS-CoV-2 viral copies this assay can detect is 138 copies/mL. A negative result does not preclude SARS-Cov-2 infection and should not be used as the sole basis for treatment or other patient management decisions. A negative result may  occur with  improper specimen collection/handling, submission of specimen other than nasopharyngeal swab, presence of viral mutation(s) within the areas targeted by this assay, and inadequate number of viral copies(<138 copies/mL). A negative result must be combined with clinical observations, patient history, and epidemiological information. The expected result is Negative.  Fact Sheet for Patients:  EntrepreneurPulse.com.au  Fact Sheet for Healthcare Providers:  IncredibleEmployment.be  This test is no t yet approved or cleared by the [place] FDA and  has been authorized for detection and/or diagnosis of SARS-CoV-2 by FDA under an Emergency Use Authorization (EUA). This EUA will remain  in effect (meaning this test can be used) for the duration of the COVID-19 declaration under Section 564(b)(1) of the Act, 21 U.S.C.section 360bbb-3(b)(1), unless the authorization is terminated  or revoked sooner.       Influenza A by PCR NEGATIVE NEGATIVE Final   Influenza B by PCR NEGATIVE NEGATIVE Final    Comment: (NOTE) The Xpert Xpress SARS-CoV-2/FLU/RSV plus assay is intended as an aid in the diagnosis of influenza from Nasopharyngeal swab specimens and should not be used as a sole basis for treatment. Nasal washings and aspirates are unacceptable for Xpert Xpress SARS-CoV-2/FLU/RSV testing.  Fact Sheet for Patients: EntrepreneurPulse.com.au  Fact Sheet for Healthcare Providers: IncredibleEmployment.be  This test is not yet approved or cleared by the [place] FDA and has been authorized for detection and/or diagnosis of SARS-CoV-2 by FDA under an Emergency Use Authorization (EUA). This  EUA will remain in effect (meaning this test can be used) for the duration of the COVID-19 declaration under Section 564(b)(1) of the Act, 21 U.S.C. section 360bbb-3(b)(1), unless the authorization is terminated  or revoked.  Performed at [facility], [place] [address] [place], [place] [address]   Urine Culture     Status: Abnormal   Collection Time: 11/07/20 12:49 PM   Specimen: Urine, Clean Catch  Result Value Ref Range Status   Specimen Description   Final    URINE, CLEAN CATCH Performed at [facility], [place] [address] [place], [place] [address]    Special Requests   Final    NONE Performed at [facility], [place] [address], [place], [place] [address]    Culture >=100,000 COLONIES/mL KLEBSIELLA PNEUMONIAE (A)  Final   Report Status 11/09/2020 FINAL  Final   Organism ID, Bacteria KLEBSIELLA PNEUMONIAE (A)  Final      Susceptibility   Klebsiella pneumoniae - MIC*    AMPICILLIN RESISTANT Resistant     CEFAZOLIN <=4 SENSITIVE Sensitive     CEFEPIME <=0.12 SENSITIVE Sensitive     CEFTRIAXONE <=0.25 SENSITIVE Sensitive     CIPROFLOXACIN <=0.25 SENSITIVE Sensitive     GENTAMICIN <=1 SENSITIVE Sensitive     IMIPENEM <=0.25 SENSITIVE Sensitive     NITROFURANTOIN 128 RESISTANT Resistant     TRIMETH/SULFA <=20 SENSITIVE Sensitive     AMPICILLIN/SULBACTAM <=2 SENSITIVE Sensitive     PIP/TAZO <=4 SENSITIVE Sensitive     * >=100,000 COLONIES/mL KLEBSIELLA PNEUMONIAE  MRSA PCR Screening     Status: None   Collection Time: 11/09/20 11:40 AM   Specimen: Nasal Mucosa; Nasopharyngeal  Result Value Ref Range Status   MRSA by PCR NEGATIVE NEGATIVE Final    Comment:        The GeneXpert MRSA Assay (FDA approved for NASAL specimens only), is one component of a comprehensive MRSA colonization surveillance program. It is not intended to diagnose MRSA infection nor to guide or monitor treatment for MRSA infections. Performed at [facility], [place] [address] [place], [place] [address]   Gastrointestinal Panel by PCR , Stool     Status: None   Collection Time: 11/10/20 12:00 PM   Specimen: Stool  Result Value Ref Range  Status   Campylobacter species NOT DETECTED NOT DETECTED Final   Plesimonas shigelloides NOT DETECTED NOT DETECTED Final   Salmonella species NOT DETECTED NOT DETECTED Final   Yersinia enterocolitica NOT DETECTED NOT DETECTED Final   Vibrio species NOT DETECTED NOT DETECTED Final   Vibrio cholerae NOT DETECTED NOT DETECTED Final   Enteroaggregative E coli (EAEC) NOT DETECTED NOT DETECTED Final   Enteropathogenic E coli (EPEC) NOT DETECTED NOT DETECTED Final   Enterotoxigenic E coli (ETEC) NOT DETECTED NOT DETECTED Final   Shiga like toxin producing E coli (STEC) NOT DETECTED NOT DETECTED Final   Shigella/Enteroinvasive E coli (EIEC) NOT DETECTED NOT DETECTED Final   Cryptosporidium NOT DETECTED NOT DETECTED Final   Cyclospora cayetanensis NOT DETECTED NOT DETECTED Final   Entamoeba histolytica NOT DETECTED NOT DETECTED Final   Giardia lamblia NOT DETECTED NOT DETECTED Final   Adenovirus F40/41 NOT DETECTED NOT DETECTED Final   Astrovirus NOT DETECTED NOT DETECTED Final   Norovirus GI/GII NOT DETECTED NOT DETECTED Final   Rotavirus A NOT DETECTED NOT DETECTED Final   Sapovirus (I, II, IV, and V) NOT DETECTED NOT DETECTED Final    Comment: Performed at [facility],  [place], [place] [address]    Please note: You were cared for by a hospitalist during your hospital stay. Once you are discharged, your primary care physician will handle any further medical issues. Please note that NO REFILLS for any discharge medications will be authorized once you are discharged, as it is imperative that you return to your primary care physician (or establish a relationship with a primary care physician if you do not have one) for your post hospital discharge needs so that they can reassess your need for medications and monitor your lab values.    Time coordinating discharge: 40 minutes  SIGNED:   Shelly Coss, MD  [facility] 11/13/2020, 10:38 AM Pager  [phone]  If 7PM-7AM, please contact night-coverage [email or web address] Password [email or web address]

## 2020-11-15 ENCOUNTER — Ambulatory Visit (HOSPITAL_COMMUNITY): Payer: Medicaid Other | Attending: Cardiology

## 2020-11-15 ENCOUNTER — Other Ambulatory Visit: Payer: Self-pay

## 2020-11-15 DIAGNOSIS — I313 Pericardial effusion (noninflammatory): Secondary | ICD-10-CM | POA: Diagnosis present

## 2020-11-15 DIAGNOSIS — I3139 Other pericardial effusion (noninflammatory): Secondary | ICD-10-CM

## 2020-11-15 LAB — ECHOCARDIOGRAM LIMITED
AR max vel: 2.27 cm2
AV Area VTI: 2.17 cm2
AV Area mean vel: 2.2 cm2
AV Mean grad: 7 mmHg
AV Peak grad: 15 mmHg
Ao pk vel: 1.94 m/s
Area-P 1/2: 3.07 cm2
MV M vel: 5.7 m/s
MV Peak grad: 130 mmHg
Radius: 0.8 cm
S' Lateral: 3.5 cm

## 2020-11-16 ENCOUNTER — Other Ambulatory Visit (HOSPITAL_COMMUNITY): Payer: Medicaid Other

## 2020-11-20 ENCOUNTER — Encounter: Payer: Medicaid Other | Admitting: Physician Assistant

## 2020-11-21 NOTE — Progress Notes (Signed)
This encounter was created in error - please disregard.

## 2020-11-29 ENCOUNTER — Inpatient Hospital Stay (HOSPITAL_COMMUNITY): Payer: Medicaid Other

## 2020-11-29 ENCOUNTER — Inpatient Hospital Stay (HOSPITAL_COMMUNITY)
Admission: EM | Admit: 2020-11-29 | Discharge: 2020-12-14 | DRG: 673 | Disposition: A | Discharge status: Home / Self Care | Payer: Medicaid Other | Source: Ambulatory Visit | Attending: Internal Medicine | Admitting: Internal Medicine

## 2020-11-29 ENCOUNTER — Emergency Department (HOSPITAL_COMMUNITY): Payer: Medicaid Other

## 2020-11-29 ENCOUNTER — Encounter (HOSPITAL_COMMUNITY): Payer: Self-pay | Admitting: Emergency Medicine

## 2020-11-29 ENCOUNTER — Other Ambulatory Visit: Payer: Self-pay

## 2020-11-29 DIAGNOSIS — E8770 Fluid overload, unspecified: Secondary | ICD-10-CM | POA: Diagnosis present

## 2020-11-29 DIAGNOSIS — N179 Acute kidney failure, unspecified: Principal | ICD-10-CM | POA: Diagnosis present

## 2020-11-29 DIAGNOSIS — E8809 Other disorders of plasma-protein metabolism, not elsewhere classified: Secondary | ICD-10-CM | POA: Diagnosis not present

## 2020-11-29 DIAGNOSIS — E7220 Disorder of urea cycle metabolism, unspecified: Secondary | ICD-10-CM | POA: Diagnosis not present

## 2020-11-29 DIAGNOSIS — I129 Hypertensive chronic kidney disease with stage 1 through stage 4 chronic kidney disease, or unspecified chronic kidney disease: Secondary | ICD-10-CM | POA: Diagnosis present

## 2020-11-29 DIAGNOSIS — D72818 Other decreased white blood cell count: Secondary | ICD-10-CM | POA: Diagnosis not present

## 2020-11-29 DIAGNOSIS — D631 Anemia in chronic kidney disease: Secondary | ICD-10-CM | POA: Diagnosis present

## 2020-11-29 DIAGNOSIS — E872 Acidosis: Secondary | ICD-10-CM | POA: Diagnosis not present

## 2020-11-29 DIAGNOSIS — N189 Chronic kidney disease, unspecified: Secondary | ICD-10-CM | POA: Diagnosis not present

## 2020-11-29 DIAGNOSIS — I160 Hypertensive urgency: Secondary | ICD-10-CM | POA: Diagnosis present

## 2020-11-29 DIAGNOSIS — J189 Pneumonia, unspecified organism: Secondary | ICD-10-CM | POA: Diagnosis not present

## 2020-11-29 DIAGNOSIS — Z79899 Other long term (current) drug therapy: Secondary | ICD-10-CM

## 2020-11-29 DIAGNOSIS — D6959 Other secondary thrombocytopenia: Secondary | ICD-10-CM | POA: Diagnosis present

## 2020-11-29 DIAGNOSIS — Z9119 Patient's noncompliance with other medical treatment and regimen: Secondary | ICD-10-CM | POA: Diagnosis not present

## 2020-11-29 DIAGNOSIS — N1831 Chronic kidney disease, stage 3a: Secondary | ICD-10-CM | POA: Diagnosis present

## 2020-11-29 DIAGNOSIS — M329 Systemic lupus erythematosus, unspecified: Secondary | ICD-10-CM | POA: Diagnosis present

## 2020-11-29 DIAGNOSIS — J45909 Unspecified asthma, uncomplicated: Secondary | ICD-10-CM | POA: Diagnosis present

## 2020-11-29 DIAGNOSIS — Z20822 Contact with and (suspected) exposure to covid-19: Secondary | ICD-10-CM | POA: Diagnosis present

## 2020-11-29 DIAGNOSIS — Z87891 Personal history of nicotine dependence: Secondary | ICD-10-CM | POA: Diagnosis not present

## 2020-11-29 DIAGNOSIS — M3214 Glomerular disease in systemic lupus erythematosus: Secondary | ICD-10-CM | POA: Diagnosis present

## 2020-11-29 DIAGNOSIS — E875 Hyperkalemia: Secondary | ICD-10-CM | POA: Diagnosis present

## 2020-11-29 DIAGNOSIS — K219 Gastro-esophageal reflux disease without esophagitis: Secondary | ICD-10-CM | POA: Diagnosis present

## 2020-11-29 DIAGNOSIS — D649 Anemia, unspecified: Secondary | ICD-10-CM | POA: Diagnosis not present

## 2020-11-29 DIAGNOSIS — I313 Pericardial effusion (noninflammatory): Secondary | ICD-10-CM | POA: Diagnosis not present

## 2020-11-29 DIAGNOSIS — R0602 Shortness of breath: Secondary | ICD-10-CM | POA: Diagnosis not present

## 2020-11-29 DIAGNOSIS — Z9114 Patient's other noncompliance with medication regimen: Secondary | ICD-10-CM

## 2020-11-29 DIAGNOSIS — M3212 Pericarditis in systemic lupus erythematosus: Secondary | ICD-10-CM | POA: Diagnosis present

## 2020-11-29 DIAGNOSIS — J90 Pleural effusion, not elsewhere classified: Secondary | ICD-10-CM | POA: Diagnosis present

## 2020-11-29 LAB — URINALYSIS, ROUTINE W REFLEX MICROSCOPIC
Bilirubin Urine: NEGATIVE
Glucose, UA: NEGATIVE mg/dL
Ketones, ur: NEGATIVE mg/dL
Leukocytes,Ua: NEGATIVE
Nitrite: NEGATIVE
Protein, ur: >=300 mg/dL — AB
Specific Gravity, Urine: 1.015 (ref 1.005–1.030)
pH: 6 (ref 5.0–8.0)

## 2020-11-29 LAB — POTASSIUM: Potassium: 5.8 mmol/L — ABNORMAL HIGH (ref 3.5–5.1)

## 2020-11-29 LAB — CBC
HCT: 20.9 % — ABNORMAL LOW (ref 36.0–46.0)
Hemoglobin: 6.5 g/dL — CL (ref 12.0–15.0)
MCH: 28.9 pg (ref 26.0–34.0)
MCHC: 31.1 g/dL (ref 30.0–36.0)
MCV: 92.9 fL (ref 80.0–100.0)
Platelets: 92 10*3/uL — ABNORMAL LOW (ref 150–400)
RBC: 2.25 MIL/uL — ABNORMAL LOW (ref 3.87–5.11)
RDW: 17.2 % — ABNORMAL HIGH (ref 11.5–15.5)
WBC: 7.7 10*3/uL (ref 4.0–10.5)
nRBC: 0 % (ref 0.0–0.2)

## 2020-11-29 LAB — CREATININE, URINE, RANDOM: Creatinine, Urine: 85.09 mg/dL

## 2020-11-29 LAB — RESP PANEL BY RT-PCR (FLU A&B, COVID) ARPGX2
Influenza A by PCR: NEGATIVE
Influenza B by PCR: NEGATIVE
SARS Coronavirus 2 by RT PCR: NEGATIVE

## 2020-11-29 LAB — SODIUM, URINE, RANDOM: Sodium, Ur: 49 mmol/L

## 2020-11-29 LAB — BASIC METABOLIC PANEL
Anion gap: 11 (ref 5–15)
BUN: 119 mg/dL — ABNORMAL HIGH (ref 6–20)
CO2: 18 mmol/L — ABNORMAL LOW (ref 22–32)
Calcium: 7.3 mg/dL — ABNORMAL LOW (ref 8.9–10.3)
Chloride: 111 mmol/L (ref 98–111)
Creatinine, Ser: 6.59 mg/dL — ABNORMAL HIGH (ref 0.44–1.00)
GFR, Estimated: 8 mL/min — ABNORMAL LOW (ref >60)
Glucose, Bld: 118 mg/dL — ABNORMAL HIGH (ref 70–99)
Potassium: 6.4 mmol/L (ref 3.5–5.1)
Sodium: 140 mmol/L (ref 135–145)

## 2020-11-29 LAB — CBG MONITORING, ED: Glucose-Capillary: 83 mg/dL (ref 70–99)

## 2020-11-29 LAB — PREPARE RBC (CROSSMATCH)

## 2020-11-29 LAB — I-STAT BETA HCG BLOOD, ED (MC, WL, AP ONLY): I-stat hCG, quantitative: <5 m[IU]/mL (ref <5)

## 2020-11-29 MED ORDER — FUROSEMIDE 10 MG/ML IJ SOLN
80.0000 mg | Freq: Once | INTRAMUSCULAR | Status: AC
  Administered 2020-11-29: 20:00:00 80 mg via INTRAVENOUS
  Filled 2020-11-29: qty 8

## 2020-11-29 MED ORDER — CALCIUM GLUCONATE 10 % IV SOLN
1.0000 g | Freq: Once | INTRAVENOUS | Status: AC
  Administered 2020-11-29: 18:00:00 1 g via INTRAVENOUS
  Filled 2020-11-29: qty 10

## 2020-11-29 MED ORDER — SODIUM CHLORIDE 0.9 % IV SOLN: 10.0000 mL/h | Freq: Once | INTRAVENOUS | Status: DC

## 2020-11-29 MED ORDER — SODIUM BICARBONATE 650 MG PO TABS
1300.0000 mg | ORAL_TABLET | Freq: Two times a day (BID) | ORAL | Status: DC
Start: 2020-11-29 — End: 2020-12-01
  Administered 2020-11-29 – 2020-11-30 (×3): 1300 mg via ORAL
  Filled 2020-11-29 (×3): qty 2

## 2020-11-29 MED ORDER — SODIUM CHLORIDE 0.9 % IV SOLN
1000.0000 mg | INTRAVENOUS | Status: AC
  Administered 2020-11-29 – 2020-12-02 (×3): 1000 mg via INTRAVENOUS
  Filled 2020-11-29 (×3): qty 8

## 2020-11-29 MED ORDER — LABETALOL HCL 200 MG PO TABS
400.0000 mg | ORAL_TABLET | Freq: Two times a day (BID) | ORAL | Status: DC
  Administered 2020-11-29 – 2020-12-14 (×29): 400 mg via ORAL
  Filled 2020-11-29 (×28): qty 2

## 2020-11-29 MED ORDER — CHLORHEXIDINE GLUCONATE CLOTH 2 % EX PADS
6.0000 | MEDICATED_PAD | Freq: Every day | CUTANEOUS | Status: DC
  Administered 2020-11-30 – 2020-12-14 (×12): 6 via TOPICAL

## 2020-11-29 MED ORDER — INSULIN ASPART 100 UNIT/ML IV SOLN
5.0000 [IU] | Freq: Once | INTRAVENOUS | Status: AC
  Administered 2020-11-29: 18:00:00 5 [IU] via INTRAVENOUS

## 2020-11-29 MED ORDER — FENOFIBRATE 160 MG PO TABS
160.0000 mg | ORAL_TABLET | Freq: Every day | ORAL | Status: DC
  Filled 2020-11-29: qty 1

## 2020-11-29 MED ORDER — SODIUM BICARBONATE 8.4 % IV SOLN
50.0000 meq | Freq: Once | INTRAVENOUS | Status: AC
  Administered 2020-11-29: 20:00:00 50 meq via INTRAVENOUS
  Filled 2020-11-29: qty 50

## 2020-11-29 MED ORDER — ACETAMINOPHEN 325 MG PO TABS
650.0000 mg | ORAL_TABLET | Freq: Four times a day (QID) | ORAL | Status: DC | PRN
  Administered 2020-12-02 – 2020-12-10 (×2): 650 mg via ORAL
  Filled 2020-11-29: qty 2

## 2020-11-29 MED ORDER — PREDNISONE 20 MG PO TABS: 30.0000 mg | ORAL_TABLET | Freq: Every day | ORAL | Status: DC

## 2020-11-29 MED ORDER — PANTOPRAZOLE SODIUM 40 MG PO TBEC
40.0000 mg | DELAYED_RELEASE_TABLET | Freq: Every day | ORAL | Status: DC
  Administered 2020-11-30: 40 mg via ORAL
  Filled 2020-11-29: qty 1

## 2020-11-29 MED ORDER — ACETAMINOPHEN 650 MG RE SUPP: 650.0000 mg | Freq: Four times a day (QID) | RECTAL | Status: DC | PRN

## 2020-11-29 MED ORDER — CLONIDINE HCL 0.1 MG PO TABS
0.1000 mg | ORAL_TABLET | Freq: Once | ORAL | Status: AC
  Administered 2020-11-29: 23:00:00 0.1 mg via ORAL
  Filled 2020-11-29: qty 1

## 2020-11-29 MED ORDER — DEXTROSE 50 % IV SOLN
INTRAVENOUS | Status: AC
  Administered 2020-11-29: 18:00:00 50 mL via INTRAVENOUS
  Filled 2020-11-29: qty 50

## 2020-11-29 MED ORDER — AMLODIPINE BESYLATE 10 MG PO TABS
10.0000 mg | ORAL_TABLET | Freq: Every day | ORAL | Status: DC
  Administered 2020-11-30 – 2020-12-14 (×16): 10 mg via ORAL
  Filled 2020-11-29 (×16): qty 1

## 2020-11-29 MED ORDER — ALBUTEROL SULFATE HFA 108 (90 BASE) MCG/ACT IN AERS
1.0000 | INHALATION_SPRAY | RESPIRATORY_TRACT | Status: DC | PRN
  Filled 2020-11-29: qty 6.7

## 2020-11-29 MED ORDER — DEXTROSE 50 % IV SOLN: 1.0000 | Freq: Once | INTRAVENOUS | Status: AC

## 2020-11-29 MED ORDER — POLYETHYLENE GLYCOL 3350 17 G PO PACK: 17.0000 g | PACK | Freq: Every day | ORAL | Status: DC | PRN

## 2020-11-29 MED ORDER — HYDRALAZINE HCL 20 MG/ML IJ SOLN: 5.0000 mg | INTRAMUSCULAR | Status: DC | PRN

## 2020-11-29 MED ORDER — FAMOTIDINE 20 MG PO TABS
10.0000 mg | ORAL_TABLET | Freq: Every day | ORAL | Status: DC
  Administered 2020-11-29: 22:00:00 10 mg via ORAL
  Filled 2020-11-29: qty 1

## 2020-11-29 MED ORDER — FUROSEMIDE 10 MG/ML IJ SOLN
80.0000 mg | Freq: Once | INTRAMUSCULAR | Status: AC
  Administered 2020-11-30: 80 mg via INTRAVENOUS
  Filled 2020-11-29: qty 8

## 2020-11-29 MED ORDER — SODIUM ZIRCONIUM CYCLOSILICATE 10 G PO PACK
10.0000 g | PACK | Freq: Once | ORAL | Status: AC
  Administered 2020-11-29: 20:00:00 10 g via ORAL
  Filled 2020-11-29: qty 1

## 2020-11-29 NOTE — H&P (Addendum)
History and Physical    Cathy [name]:[id number] DOB: 1981-07-21 DOA: 11/29/2020  PCP: Patient, No Pcp Per Patient coming from: Home  Chief Complaint: Shortness of breath  HPI: Cathy [name] is a 39 y.o. female with medical history significant of SLE, CKD stage II-IIIa/lupus nephritis, hypertension, asthma, substance abuse, bipolar disorder, depression, history of medication noncompliance presenting with complaints of shortness of breath and bilateral lower extremity edema. Patient states she was sent here by her kidney doctor because her kidney numbers are high and she is having a lot of swelling. Reports bilateral lower extremity edema which extends to her abdomen. Reports shortness of breath and cough. States she is taking Lasix 40 mg daily as instructed by her nephrologist but it is not helping with the swelling. She is not having much urine output. States her nephrologist had spoken to her previously about dialysis and she is now ready to get it started. She is currently on a prednisone taper for her lupus and reports compliance. She has not seen rheumatology yet. Also reports compliance with her home blood pressure medications.  Of note, patient was recently admitted to the hospital 12/8-12/14 for anasarca and AKI on CKD stage II/IIIa/lupus nephritis.  Nephrology and cardiology were consulted.  Her anasarca improved with IV diuretics.  ED Course: Blood pressure elevated with systolic up to 628B and diastolic up to 151V.  WBC 7.7, hemoglobin 6.5, platelet count 92K.  Sodium 140, potassium 6.4, chloride 111, bicarb 18, anion gap 11, BUN 119, creatinine 6.5, glucose 118.  Beta hCG negative.  SARS-CoV-2 PCR test and influenza panel both pending.  Chest x-ray showing cardiac enlargement with bilateral pleural effusions, greater on the left.  Infiltration or atelectasis in the left lung base.  Progression since previous study.  Patient was given IV Lasix 80 mg.  She was also given  medical treatment for hyperkalemia including calcium gluconate, IV sodium bicarbonate, insulin/dextrose, and Lokelma.  Nephrology consulted.  Review of Systems:  All systems reviewed and apart from history of presenting illness, are negative.  Past Medical History:  Diagnosis Date  . Anxiety   . Asthma   . Bipolar depression ([place])   . Depression   . Gonorrhea   . Lupus ([place])   . Pelvic inflammatory disease (PID)   . Renal hypertension   . Trichomonas infection     Past Surgical History:  Procedure Laterality Date  . RENAL BIOPSY    . TUBAL LIGATION N/A 02/03/2019   Procedure: POST PARTUM TUBAL LIGATION;  Surgeon: Aletha Halim, MD;  Location: [facility];  Service: Gynecology;  Laterality: N/A;     reports that she quit smoking about 7 weeks ago. Her smoking use included cigarettes. She smoked 0.25 packs per day. She has never used smokeless tobacco. She reports previous alcohol use. She reports previous drug use. Drug: Marijuana.  No Known Allergies  Family History  Problem Relation Age of Onset  . Diabetes Maternal Grandmother   . Hypertension Maternal Grandmother   . Diabetes Maternal Grandfather   . Hypertension Maternal Grandfather   . Diabetes Paternal Grandmother   . Hypertension Paternal Grandmother   . Diabetes Paternal Grandfather   . Hypertension Paternal Grandfather     Prior to Admission medications   Medication Sig Start Date End Date Taking? Authorizing Provider  albuterol (PROAIR HFA) 108 (90 Base) MCG/ACT inhaler Inhale 2 puffs into the lungs every 2 (two) hours as needed for wheezing or shortness of breath.   Yes [provider]  amLODipine (NORVASC) 10 MG tablet Take 1 tablet (10 mg total) by mouth daily. 11/13/20  Yes Shelly Coss, MD  famotidine (PEPCID) 10 MG tablet Take 1 tablet (10 mg total) by mouth daily. Patient taking differently: Take 10 mg by mouth at bedtime. 11/14/20  Yes Shelly Coss, MD  furosemide (LASIX) 40 MG tablet  Take 1 tablet (40 mg total) by mouth 3 (three) times a week. Take on Monday,wednesday and friday Patient taking differently: Take 40 mg by mouth in the morning. 11/14/20 01/23/21 Yes Shelly Coss, MD  labetalol (NORMODYNE) 200 MG tablet Take 2 tablets (400 mg total) by mouth 2 (two) times daily. 11/13/20  Yes Shelly Coss, MD  predniSONE (DELTASONE) 10 MG tablet Take 1 tablet (10 mg total) by mouth daily with breakfast. Take 5 pills daily for 7 days then 4 pills daily for 7 days then 3 pills daily for 7 days then 2 pills daily for 7 days then 1 pill daily for 7 days then stop Patient taking differently: Take 10-50 mg by mouth See admin instructions. Take 50 mg by mouth once daily for 7 days, 40 mg once daily for 7 days, 30 mg once daily for 7 days, 20 mg once daily for 7 days, 10 mg once daily for 7 days, then stop 11/13/20  Yes Adhikari, Tamsen Meek, MD  sodium bicarbonate 650 MG tablet Take 2 tablets (1,300 mg total) by mouth 2 (two) times daily. 11/13/20 12/13/20 Yes Shelly Coss, MD  fenofibrate 160 MG tablet Take 1 tablet (160 mg total) by mouth daily. 11/14/20   Shelly Coss, MD  hydrocortisone cream 1 % Apply topically 2 (two) times daily. 11/13/20   Shelly Coss, MD  polyethylene glycol (MIRALAX / GLYCOLAX) 17 g packet Take 17 g by mouth daily as needed for mild constipation. Patient taking differently: Take 17 g by mouth daily as needed for mild constipation (MIX AND DRINK). 11/13/20   Shelly Coss, MD    Physical Exam: Vitals:   11/29/20 1812 11/29/20 1815 11/29/20 1830 11/29/20 1911  BP: (!) 181/122 (!) 179/127  (!) 186/122  Pulse: 81 76 70 80  Resp: 17 14 (!) 21 (!) 23  Temp:      TempSrc:      SpO2: 96% 100% 100% 100%    Physical Exam Constitutional:      Appearance: She is not diaphoretic.  HENT:     Head: Normocephalic and atraumatic.  Eyes:     Extraocular Movements: Extraocular movements intact.     Conjunctiva/sclera: Conjunctivae normal.  Cardiovascular:      Rate and Rhythm: Normal rate and regular rhythm.     Pulses: Normal pulses.  Pulmonary:     Effort: Pulmonary effort is normal.     Breath sounds: No wheezing or rales.  Abdominal:     General: Bowel sounds are normal. There is distension.     Palpations: Abdomen is soft.     Tenderness: There is no abdominal tenderness. There is no guarding.  Musculoskeletal:     Cervical back: Normal range of motion and neck supple.     Right lower leg: Edema present.     Left lower leg: Edema present.     Comments: +4 pitting edema of bilateral lower extremities extending up to the abdomen Edema of bilateral upper extremities  Skin:    General: Skin is warm and dry.  Neurological:     General: No focal deficit present.     Mental Status: She is alert  and oriented to person, place, and time.     Labs on Admission: I have personally reviewed following labs and imaging studies  CBC: Recent Labs  Lab 11/29/20 1701  WBC 7.7  HGB 6.5*  HCT 20.9*  MCV 92.9  PLT 92*   Basic Metabolic Panel: Recent Labs  Lab 11/29/20 1701  NA 140  K 6.4*  CL 111  CO2 18*  GLUCOSE 118*  BUN 119*  CREATININE 6.59*  CALCIUM 7.3*   GFR: CrCl cannot be calculated (Unknown ideal weight.). Liver Function Tests: No results for input(s): AST, ALT, ALKPHOS, BILITOT, PROT, ALBUMIN in the last 168 hours. No results for input(s): LIPASE, AMYLASE in the last 168 hours. No results for input(s): AMMONIA in the last 168 hours. Coagulation Profile: No results for input(s): INR, PROTIME in the last 168 hours. Cardiac Enzymes: No results for input(s): CKTOTAL, CKMB, CKMBINDEX, TROPONINI in the last 168 hours. BNP (last 3 results) No results for input(s): PROBNP in the last 8760 hours. HbA1C: No results for input(s): HGBA1C in the last 72 hours. CBG: Recent Labs  Lab 11/29/20 1943  GLUCAP 83   Lipid Profile: No results for input(s): CHOL, HDL, LDLCALC, TRIG, CHOLHDL, LDLDIRECT in the last 72  hours. Thyroid Function Tests: No results for input(s): TSH, T4TOTAL, FREET4, T3FREE, THYROIDAB in the last 72 hours. Anemia Panel: No results for input(s): VITAMINB12, FOLATE, FERRITIN, TIBC, IRON, RETICCTPCT in the last 72 hours. Urine analysis:    Component Value Date/Time   COLORURINE YELLOW 11/08/2020 1237   APPEARANCEUR CLOUDY (A) 11/08/2020 1237   LABSPEC 1.015 11/08/2020 1237   PHURINE 5.0 11/08/2020 1237   GLUCOSEU NEGATIVE 11/08/2020 1237   HGBUR MODERATE (A) 11/08/2020 1237   BILIRUBINUR NEGATIVE 11/08/2020 1237   KETONESUR NEGATIVE 11/08/2020 1237   PROTEINUR >=300 (A) 11/08/2020 1237   UROBILINOGEN 0.2 10/17/2020 1026   NITRITE NEGATIVE 11/08/2020 1237   LEUKOCYTESUR SMALL (A) 11/08/2020 1237    Radiological Exams on Admission: DG Chest Portable 1 View  Result Date: 11/29/2020 CLINICAL DATA:  Volume overload. Anemia. 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 EXAM: PORTABLE CHEST 1 VIEW COMPARISON:  11/07/2020 FINDINGS: Cardiac enlargement. Bilateral pleural effusions, greater on the left. Infiltration or atelectasis in the left base. Mild perihilar infiltration, possibly edema. No pneumothorax. IMPRESSION: Cardiac enlargement with bilateral pleural effusions, greater on the left. Infiltration or atelectasis in the left base. Progression since previous study. Electronically Signed   By: Lucienne Capers M.D.   On: 11/29/2020 18:38    EKG: Independently reviewed.  Sinus rhythm, low voltage.  Assessment/Plan Principal Problem:   Acute renal failure (ARF) (HCC) Active Problems:   Lupus (HCC)   Anemia   Volume overload   Hyperkalemia   AKI on CKD stage II-IIIa/lupus nephritis: Likely due to worsening lupus nephritis and hypertensive nephrosclerosis.  Creatinine was 0.8 in September 2021 and at the time of hospital discharge on 12/14 creatinine was 2.9.  On labs done today, BUN 119 and creatinine up to 6.5. -Patient was given IV Lasix 80 mg x 1 and ED provider discussed  the case with Dr. Royce Macadamia, nephrology will consult.  Continue to monitor renal function and urine output closely.  Avoid nephrotoxic agents/contrast.  Order renal ultrasound.  Check UA, urine sodium and creatinine.  Hyperkalemia: Likely due to AKI.  Potassium 6.4.  EKG without acute changes. -She was given medical treatment for hyperkalemia including calcium gluconate, IV sodium bicarbonate, insulin/dextrose, and Lokelma.  Repeat labs to check potassium level and monitor closely.  Normal anion gap metabolic acidosis: Likely due to AKI.  Bicarb 18, anion gap 11. -Patient was given IV sodium bicarbonate in the ED.  Continue home oral bicarb supplementation and monitor metabolic panel closely.  Volume overload/anasarca: Chest x-ray showing cardiac enlargement with bilateral pleural effusions, greater on the left. Recent echo done 11/15/2020 showing normal LVEF of 55-60% and indeterminate LV diastolic parameters. -Patient was given IV Lasix 80 mg x 1 in the ED.  Check BNP level.  Further recommendations per nephrology given AKI.  Hypertensive urgency: Blood pressure elevated with systolic up to 622Q and diastolic up to 333L. -Continue home amlodipine and labetalol.  IV hydralazine PRN.  Acute on chronic normocytic anemia: Hemoglobin 6.5 and MCV 92.9.  Hemoglobin ranging between 7.8-8.4 on recent labs.  Likely related to renal disease.  Patient is not endorsing any symptoms of GI bleed. -Type and screen, 1 unit PRBCs ordered in the ED.  Follow-up posttransfusion H&H. Check FOBT.  Lupus: Patient was previously on hydrochloroquine.  She is currently on a prednisone taper which was started during her recent hospitalization.  She has not seen rheumatology yet. -Continue prednisone taper  Thrombocytopenia: Likely due to AKI.  Platelet count 92K, previously within normal range.  No signs of active bleeding. -Continue to monitor platelet count closely  Abnormal chest x-ray finding: Chest x-ray showing  infiltration or atelectasis in the left lung base.  Pneumonia less likely given no fever or leukocytosis. -Check procalcitonin level  Addendum: Procalcitonin significantly elevated at 0.64.  UA not suggestive of infection.  Blood cultures ordered and antibiotic started for coverage of possible CAP.  Pericardial effusion: Recent echo revealed a moderate pericardial effusion.  No signs of tamponade at present. -Seen by outpatient cardiology.  DVT prophylaxis: SCDs Code Status: Full code Family Communication: No family available at this time. Disposition Plan: Status is: Inpatient  Remains inpatient appropriate because:IV treatments appropriate due to intensity of illness or inability to take PO, Inpatient level of care appropriate due to severity of illness and Needs IV diuresis for anasarca   Dispo: The patient is from: Home              Anticipated d/c is to: Home              Anticipated d/c date is: 3 days              Patient currently is not medically stable to d/c.  The medical decision making on this patient was of high complexity and the patient is at high risk for clinical deterioration, therefore this is a level 3 visit.  Shela Leff MD [facility]  If 7PM-7AM, please contact night-coverage [email or web address]  11/29/2020, 8:43 PM

## 2020-11-29 NOTE — Consult Note (Signed)
[facility] Renal Consultation Note  Requesting MD: Lennice Sites, MD Indication for Consultation:  AKI, hyperkalemia  Chief complaint: "Shortness of breath and they told me my potassium was high"  HPI:  Cathy [name] is a 39 y.o. female with a history of lupus, hypertension, CKD, and charted as having bipolar disorder who presented to the ER after progressively worsening shortness of breath as well after a call after a follow-up visit indicating that her potassium was high and her creatinine was worse.  She has had shortness of breath for some time-a couple of weeks- on an outpatient regimen of Lasix 40 mg Monday Wednesday Friday.  She was recently admitted with pericardial effusion and overload and UTI.  Her lupus was diagnosed in 2019.  We discussed the risks benefits indications of dialysis and she does wish to proceed with dialysis.  She states that she has been on 30 mg of prednisone daily at home.  Her i-STAT hCG was negative and her Covid screen was negative.  She states that she is s/p bilateral tubal ligation.  Note that unfortunately she does have a history of nonadherence with outpatient follow-up.  Per charting she was diagnosed with lupus nephritis in 2019 when she was pregnant with AKI.  Also per charting she has biopsy-proven class III lupus nephritis 01/2018 and was lost to follow-up and did not take her medication.  CT abdomen pelvis earlier this month ruled out obstruction given her UTI she did not have a renal biopsy during her last visit.     Creatinine, Ser  Date/Time Value Ref Range Status  11/29/2020 05:01 PM 6.59 (H) 0.44 - 1.00 mg/dL Final  11/13/2020 05:48 AM 2.99 (H) 0.44 - 1.00 mg/dL Final  11/12/2020 05:26 AM 2.88 (H) 0.44 - 1.00 mg/dL Final  11/11/2020 05:49 AM 2.64 (H) 0.44 - 1.00 mg/dL Final  11/10/2020 06:57 AM 2.25 (H) 0.44 - 1.00 mg/dL Final  11/09/2020 07:07 AM 2.21 (H) 0.44 - 1.00 mg/dL Final  11/08/2020 06:03 AM 2.26 (H) 0.44 - 1.00 mg/dL  Final  11/07/2020 11:04 AM 2.50 (H) 0.44 - 1.00 mg/dL Final  11/06/2020 05:59 PM 2.61 (H) 0.44 - 1.00 mg/dL Final  10/17/2020 10:15 AM 1.77 (H) 0.44 - 1.00 mg/dL Final  08/07/2020 12:09 PM 0.80 0.44 - 1.00 mg/dL Final  02/05/2019 06:45 AM 1.12 (H) 0.44 - 1.00 mg/dL Final  02/04/2019 09:02 AM 1.17 (H) 0.44 - 1.00 mg/dL Final  02/03/2019 10:53 AM 1.08 (H) 0.44 - 1.00 mg/dL Final  02/03/2019 06:42 AM 1.18 (H) 0.44 - 1.00 mg/dL Final  02/03/2019 12:03 AM 1.20 (H) 0.44 - 1.00 mg/dL Final  02/02/2019 06:00 PM 1.13 (H) 0.44 - 1.00 mg/dL Final  02/02/2019 11:40 AM 1.16 (H) 0.44 - 1.00 mg/dL Final  02/02/2019 05:56 AM 1.16 (H) 0.44 - 1.00 mg/dL Final  02/01/2019 07:49 PM 1.09 (H) 0.44 - 1.00 mg/dL Final  12/10/2018 11:42 AM 0.92 0.57 - 1.00 mg/dL Final  10/18/2018 04:31 PM 1.35 (H) 0.57 - 1.00 mg/dL Final  10/12/2018 03:36 PM 1.56 (H) 0.44 - 1.00 mg/dL Final  10/04/2018 11:31 AM 1.53 (H) 0.57 - 1.00 mg/dL Final  09/29/2018 03:55 AM 1.73 (H) 0.44 - 1.00 mg/dL Final  09/28/2018 05:20 AM 2.00 (H) 0.44 - 1.00 mg/dL Final  09/27/2018 12:06 AM 2.54 (H) 0.44 - 1.00 mg/dL Final  09/26/2018 04:26 AM 2.70 (H) 0.44 - 1.00 mg/dL Final  09/25/2018 04:20 AM 2.51 (H) 0.44 - 1.00 mg/dL Final  09/24/2018 04:45 AM 2.36 (H) 0.44 -  1.00 mg/dL Final  09/23/2018 03:47 AM 2.37 (H) 0.44 - 1.00 mg/dL Final  09/22/2018 03:57 AM 2.27 (H) 0.44 - 1.00 mg/dL Final  09/21/2018 02:51 AM 2.38 (H) 0.44 - 1.00 mg/dL Final  09/20/2018 08:43 PM 2.43 (H) 0.44 - 1.00 mg/dL Final  09/20/2018 02:41 PM 2.51 (H) 0.44 - 1.00 mg/dL Final  09/20/2018 01:23 PM 2.44 (H) 0.44 - 1.00 mg/dL Final  07/20/2018 02:43 PM 0.60 0.44 - 1.00 mg/dL Final  01/10/2017 08:13 PM 0.99 0.44 - 1.00 mg/dL Final  09/01/2016 08:08 PM 0.86 0.44 - 1.00 mg/dL Final  04/13/2016 06:24 PM 0.91 0.44 - 1.00 mg/dL Final  10/10/2015 01:48 PM 0.82 0.44 - 1.00 mg/dL Final  03/13/2015 08:16 PM 0.99 0.50 - 1.10 mg/dL Final  05/01/2014 03:37 PM 0.79 0.50 - 1.10 mg/dL Final   04/11/2014 04:46 PM 0.87 0.50 - 1.10 mg/dL Final  05/18/2007 11:59 AM 0.56  Final     PMHx:   Past Medical History:  Diagnosis Date  . Anxiety   . Asthma   . Bipolar depression ([place])   . Depression   . Gonorrhea   . Lupus ([place])   . Pelvic inflammatory disease (PID)   . Renal hypertension   . Trichomonas infection     Past Surgical History:  Procedure Laterality Date  . RENAL BIOPSY    . TUBAL LIGATION N/A 02/03/2019   Procedure: POST PARTUM TUBAL LIGATION;  Surgeon: Aletha Halim, MD;  Location: [facility]S;  Service: Gynecology;  Laterality: N/A;    Family Hx:  Family History  Problem Relation Age of Onset  . Diabetes Maternal Grandmother   . Hypertension Maternal Grandmother   . Diabetes Maternal Grandfather   . Hypertension Maternal Grandfather   . Diabetes Paternal Grandmother   . Hypertension Paternal Grandmother   . Diabetes Paternal Grandfather   . Hypertension Paternal Grandfather     Social History:  reports that she quit smoking about 7 weeks ago. Her smoking use included cigarettes. She smoked 0.25 packs per day. She has never used smokeless tobacco. She reports previous alcohol use. She reports previous drug use. Drug: Marijuana.  Allergies: No Known Allergies  Medications: Prior to Admission medications   Medication Sig Start Date End Date Taking? Authorizing Provider  albuterol (PROAIR HFA) 108 (90 Base) MCG/ACT inhaler Inhale 2 puffs into the lungs every 2 (two) hours as needed for wheezing or shortness of breath.   Yes [provider]  amLODipine (NORVASC) 10 MG tablet Take 1 tablet (10 mg total) by mouth daily. 11/13/20  Yes Shelly Coss, MD  famotidine (PEPCID) 10 MG tablet Take 1 tablet (10 mg total) by mouth daily. Patient taking differently: Take 10 mg by mouth at bedtime. 11/14/20  Yes Shelly Coss, MD  furosemide (LASIX) 40 MG tablet Take 1 tablet (40 mg total) by mouth 3 (three) times a week. Take on Monday,wednesday and  friday Patient taking differently: Take 40 mg by mouth in the morning. 11/14/20 01/23/21 Yes Shelly Coss, MD  labetalol (NORMODYNE) 200 MG tablet Take 2 tablets (400 mg total) by mouth 2 (two) times daily. 11/13/20  Yes Shelly Coss, MD  predniSONE (DELTASONE) 10 MG tablet Take 1 tablet (10 mg total) by mouth daily with breakfast. Take 5 pills daily for 7 days then 4 pills daily for 7 days then 3 pills daily for 7 days then 2 pills daily for 7 days then 1 pill daily for 7 days then stop Patient taking differently: Take 10-50 mg by mouth  See admin instructions. Take 50 mg by mouth once daily for 7 days, 40 mg once daily for 7 days, 30 mg once daily for 7 days, 20 mg once daily for 7 days, 10 mg once daily for 7 days, then stop 11/13/20  Yes Adhikari, Tamsen Meek, MD  sodium bicarbonate 650 MG tablet Take 2 tablets (1,300 mg total) by mouth 2 (two) times daily. 11/13/20 12/13/20 Yes Shelly Coss, MD  fenofibrate 160 MG tablet Take 1 tablet (160 mg total) by mouth daily. 11/14/20   Shelly Coss, MD  hydrocortisone cream 1 % Apply topically 2 (two) times daily. 11/13/20   Shelly Coss, MD  polyethylene glycol (MIRALAX / GLYCOLAX) 17 g packet Take 17 g by mouth daily as needed for mild constipation. Patient taking differently: Take 17 g by mouth daily as needed for mild constipation (MIX AND DRINK). 11/13/20   Shelly Coss, MD    I have reviewed the patient's current and reported prior to admission medications.  Labs:  BMP Latest Ref Rng & Units 11/29/2020 11/13/2020 11/12/2020  Glucose 70 - 99 mg/dL 118(H) 130(H) 113(H)  BUN 6 - 20 mg/dL 119(H) 82(H) 78(H)  Creatinine 0.44 - 1.00 mg/dL 6.59(H) 2.99(H) 2.88(H)  BUN/Creat Ratio 9 - 23 - - -  Sodium 135 - 145 mmol/L 140 138 137  Potassium 3.5 - 5.1 mmol/L 6.4(HH) 4.8 4.9  Chloride 98 - 111 mmol/L 111 111 109  CO2 22 - 32 mmol/L 18(L) 16(L) 17(L)  Calcium 8.9 - 10.3 mg/dL 7.3(L) 7.5(L) 7.4(L)    Urinalysis    Component Value Date/Time    COLORURINE YELLOW 11/08/2020 1237   APPEARANCEUR CLOUDY (A) 11/08/2020 1237   LABSPEC 1.015 11/08/2020 1237   PHURINE 5.0 11/08/2020 1237   GLUCOSEU NEGATIVE 11/08/2020 1237   HGBUR MODERATE (A) 11/08/2020 1237   BILIRUBINUR NEGATIVE 11/08/2020 1237   KETONESUR NEGATIVE 11/08/2020 1237   PROTEINUR >=300 (A) 11/08/2020 1237   UROBILINOGEN 0.2 10/17/2020 1026   NITRITE NEGATIVE 11/08/2020 1237   LEUKOCYTESUR SMALL (A) 11/08/2020 1237     ROS:  Pertinent items noted in HPI and remainder of comprehensive ROS otherwise negative.  Physical Exam: Vitals:   11/29/20 2043 11/29/20 2059  BP: (!) 185/124 (!) 188/126  Pulse: 77 74  Resp: (!) 22 19  Temp: 97.8 F (36.6 C) 97.8 F (36.6 C)  SpO2: 100% 100%     General: Adult female in stretcher in moderate distress secondary to pain citing her lupus HEENT: Normocephalic atraumatic Eyes: Extraocular movements intact sclera anicteric Neck: Supple trachea midline Heart: S1-S2 I do not appreciate a rub Lungs: Clear to auscultation anteriorly she is on 0.5 L and nurse states was placed for comfort Abdomen: soft and distended and somewhat tender Extremities: 3+ edema lower extremity which are diffusely tender to palpation citing joint pain from lupus Skin: Appears taught on legs; no rash appreciated on extremities exposed Neuro: Alert and oriented x3 provides a history and follows commands Psych normal mood and affect Genitourinary pure wick in place with urine output noted in response to Lasix approximately 300 mL at the time of my exam  Assessment/Plan:  # Acute kidney injury - Felt secondary to lupus nephritis - Solu-Medrol 1 g now and daily x3 days then anticipate transition back to oral pred  - Start HD.  I have discussed the risks and benefits of medications of dialysis and she does consent to dialysis - Plan for HD on 12/31 and 1/1 then assess needs daily - Consult IR for  tunneled catheter placement for dialysis initiation -  Add GI ppx - Check postvoid residual bladder scan and Place Foley catheter if greater than 300 mL retained - Consider renal biopsy  # Hyperkalemia - s/p lasix, lokelma, bicarb, calcium, insulin/dextrose  - repeat lasix in 6 hours - have ordered lasix 80 mg IV once  - note stat K is ordered - hopeful for improvement overnight with temporizing measures  # Anasarca - Initiating HD - s/p lasix 80 mg IV and will redose overnight  # Anemia - normocytic - Agree with packed red blood cells  # Lupus - Solu-Medrol as above x 3 days; then anticipate transition back to oral pred  # pericardial effusion  - Starting HD - Diurese as above  # Chronic kidney disease stage IV  - May be IIIb but regardless with likely progression over time  # Hypertension - Clonidine once now - Given marked swelling and pain of her lower extremities would hope to transition off of amlodipine in the event contributing at all much more likely due to her nephrotic syndrome - Continue other home regimen  # Metabolic acidosis  - Once on HD can stop bicarb  Claudia Desanctis 11/29/2020, 10:15 PM

## 2020-11-29 NOTE — ED Triage Notes (Addendum)
Patient BIB by [facility] after PCP told patient to go to ED due to anemia and possible need for blood transfusion. Patient goes to heart failure clinic and is stage 4 ESRD, not on dialysis yet.  Patient also complains of BLE edema and shortness of breath. SOB improved with 2L [place].  20g left AC. 2L [place].

## 2020-11-29 NOTE — ED Notes (Signed)
364ml via bladder scan

## 2020-11-29 NOTE — ED Notes (Signed)
Date and time results received: 11/29/20 6:02 PM  (use smartphrase ".now" to insert current time)  Test: POTASSIUM Critical Value: 6.4  Name of Provider Notified: CURATOLO  Orders Received? Or Actions Taken?: Orders Received - See Orders for details

## 2020-11-29 NOTE — ED Provider Notes (Signed)
[facility] EMERGENCY DEPARTMENT Provider Note   CSN: [id number] Arrival date & time: 11/29/20  1651     History Chief Complaint  Patient presents with  . Shortness of Breath    Cathy [name] is a 39 y.o. female.  Patient with history of lupus nephritis who presents the ED with elevated potassium, shortness of breath.  Saw nephrology yesterday and had lab work done.  Has been in discussion with nephrology about starting dialysis.  She has worsening creatinine, difficulty with volume overload.  Has increased swelling in her legs and abdomen and more difficulty with shortness of breath.  Was recently admitted for the same and improved on IV Lasix.  She supposedly had a potassium greater than 6 and was told to come from nephrology office for further care.  The history is provided by the patient.  Shortness of Breath Severity:  Mild Onset quality:  Gradual Timing:  Constant Progression:  Worsening Chronicity:  Recurrent Relieved by:  Nothing Worsened by:  Nothing Associated symptoms: no abdominal pain, no chest pain, no claudication, no cough, no ear pain, no fever, no rash, no sore throat and no vomiting   Risk factors comment:  Lupus nephritis      Past Medical History:  Diagnosis Date  . Anxiety   . Asthma   . Bipolar depression ([place])   . Depression   . Gonorrhea   . Lupus ([place])   . Pelvic inflammatory disease (PID)   . Renal hypertension   . Trichomonas infection     Patient Active Problem List   Diagnosis Date Noted  . Pericardial effusion   . Aortic atherosclerosis ([place]) 11/07/2020  . Anasarca 11/07/2020  . Abdominal pain 11/07/2020  . Diarrhea 11/07/2020  . 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 Tubal ligation evaluation 02/03/2019  . Renal hypertension 02/02/2019  . Pulmonary edema 02/01/2019  . AMA (advanced maternal age) multigravida 35+ 01/12/2019  . Non compliance w medication regimen  01/12/2019  . Current chronic use of systemic steroids 01/12/2019  . Unwanted fertility 12/10/2018  . Anemia 10/12/2018  . Supervision of high risk pregnancy, antepartum 10/04/2018  . Lupus ([place]) 09/20/2018  . Supervision of pregnancy with grand multiparity in second trimester 09/20/2018  . Substance abuse ([place]) 09/20/2018  . Acute renal failure (ARF) ([place]) 09/20/2018  . MDD (major depressive disorder) 10/11/2015  . Asthma 09/22/2011    Past Surgical History:  Procedure Laterality Date  . RENAL BIOPSY    . TUBAL LIGATION N/A 02/03/2019   Procedure: POST PARTUM TUBAL LIGATION;  Surgeon: Aletha Halim, MD;  Location: [facility] LD [facility]S;  Service: Gynecology;  Laterality: N/A;     OB History    Gravida  6   Para  5   Term  4   Preterm  1   AB  1   Living  5     SAB  1   IAB  0   Ectopic  0   Multiple  0   Live Births  5           Family History  Problem Relation Age of Onset  . Diabetes Maternal Grandmother   . Hypertension Maternal Grandmother   . Diabetes Maternal Grandfather   . Hypertension Maternal Grandfather   . Diabetes Paternal Grandmother   . Hypertension Paternal Grandmother   . Diabetes Paternal Grandfather   . Hypertension Paternal Grandfather     Social History   Tobacco Use  .  Smoking status: Former Smoker    Packs/day: 0.25    Types: Cigarettes    Quit date: 10/08/2020    Years since quitting: 0.1  . Smokeless tobacco: Never Used  Vaping Use  . Vaping Use: Never used  Substance Use Topics  . Alcohol use: Not Currently  . Drug use: Not Currently    Types: Marijuana    Comment: 3 times a week     Home Medications Prior to Admission medications   Medication Sig Start Date End Date Taking? Authorizing Provider  albuterol (PROAIR HFA) 108 (90 Base) MCG/ACT inhaler Inhale 2 puffs into the lungs every 2 (two) hours as needed for wheezing or shortness of breath.   Yes [provider]  amLODipine (NORVASC) 10 MG tablet Take 1  tablet (10 mg total) by mouth daily. 11/13/20  Yes Shelly Coss, MD  famotidine (PEPCID) 10 MG tablet Take 1 tablet (10 mg total) by mouth daily. Patient taking differently: Take 10 mg by mouth at bedtime. 11/14/20  Yes Shelly Coss, MD  furosemide (LASIX) 40 MG tablet Take 1 tablet (40 mg total) by mouth 3 (three) times a week. Take on Monday,wednesday and friday Patient taking differently: Take 40 mg by mouth in the morning. 11/14/20 01/23/21 Yes Shelly Coss, MD  labetalol (NORMODYNE) 200 MG tablet Take 2 tablets (400 mg total) by mouth 2 (two) times daily. 11/13/20  Yes Shelly Coss, MD  predniSONE (DELTASONE) 10 MG tablet Take 1 tablet (10 mg total) by mouth daily with breakfast. Take 5 pills daily for 7 days then 4 pills daily for 7 days then 3 pills daily for 7 days then 2 pills daily for 7 days then 1 pill daily for 7 days then stop Patient taking differently: Take 10-50 mg by mouth See admin instructions. Take 5 pills daily for 7 days then 4 pills daily for 7 days then 3 pills daily for 7 days then 2 pills daily for 7 days then 1 pill daily for 7 days then stop Take 50 mg by mouth once daily for 7 days, 40 mg once daily for 7 days, 30 mg once daily for 7 days, 20 mg once daily for 7 days, 10 mg once daily for 7 days, then stop 11/13/20  Yes Adhikari, Tamsen Meek, MD  sodium bicarbonate 650 MG tablet Take 2 tablets (1,300 mg total) by mouth 2 (two) times daily. 11/13/20 12/13/20 Yes Shelly Coss, MD  fenofibrate 160 MG tablet Take 1 tablet (160 mg total) by mouth daily. 11/14/20   Shelly Coss, MD  hydrocortisone cream 1 % Apply topically 2 (two) times daily. 11/13/20   Shelly Coss, MD  polyethylene glycol (MIRALAX / GLYCOLAX) 17 g packet Take 17 g by mouth daily as needed for mild constipation. Patient taking differently: Take 17 g by mouth daily as needed for mild constipation (MIX AND DRINK). 11/13/20   Shelly Coss, MD    Allergies    Patient has no known  allergies.  Review of Systems   Review of Systems  Constitutional: Negative for chills and fever.  HENT: Negative for ear pain and sore throat.   Eyes: Negative for pain and visual disturbance.  Respiratory: Positive for shortness of breath. Negative for cough.   Cardiovascular: Positive for leg swelling. Negative for chest pain, palpitations and claudication.  Gastrointestinal: Positive for abdominal distention. Negative for abdominal pain and vomiting.  Genitourinary: Negative for dysuria and hematuria.  Musculoskeletal: Negative for arthralgias and back pain.  Skin: Negative for color change and  rash.  Neurological: Negative for seizures and syncope.  All other systems reviewed and are negative.   Physical Exam Updated Vital Signs  ED Triage Vitals  Enc Vitals Group     BP 11/29/20 1657 (!) 173/108     Pulse Rate 11/29/20 1657 79     Resp 11/29/20 1657 (!) 24     Temp 11/29/20 1657 98.2 F (36.8 C)     Temp Source 11/29/20 1657 Oral     SpO2 11/29/20 1657 100 %     Weight --      Height --      Head Circumference --      Peak Flow --      Pain Score 11/29/20 1655 10     Pain Loc --      Pain Edu? --      Excl. in [place]? --     Physical Exam Vitals and nursing note reviewed.  Constitutional:      General: She is not in acute distress.    Appearance: She is well-developed and well-nourished. She is ill-appearing.  HENT:     Head: Normocephalic and atraumatic.  Eyes:     Conjunctiva/sclera: Conjunctivae normal.     Pupils: Pupils are equal, round, and reactive to light.  Cardiovascular:     Rate and Rhythm: Normal rate and regular rhythm.     Pulses: Normal pulses.     Heart sounds: Normal heart sounds. No murmur heard.   Pulmonary:     Effort: Pulmonary effort is normal. Tachypnea present. No respiratory distress.     Breath sounds: Decreased breath sounds present.  Abdominal:     Palpations: Abdomen is soft.     Tenderness: There is no abdominal tenderness.      Comments: Distended   Musculoskeletal:     Cervical back: Normal range of motion and neck supple.     Right lower leg: Edema (3+) present.     Left lower leg: Edema (3+) present.  Skin:    General: Skin is warm and dry.     Capillary Refill: Capillary refill takes less than 2 seconds.  Neurological:     General: No focal deficit present.     Mental Status: She is alert.  Psychiatric:        Mood and Affect: Mood and affect and mood normal.     ED Results / Procedures / Treatments   Labs (all labs ordered are listed, but only abnormal results are displayed) Labs Reviewed  BASIC METABOLIC PANEL - Abnormal; Notable for the following components:      Result Value   Potassium 6.4 (*)    CO2 18 (*)    Glucose, Bld 118 (*)    BUN 119 (*)    Creatinine, Ser 6.59 (*)    Calcium 7.3 (*)    GFR, Estimated 8 (*)    All other components within normal limits  CBC - Abnormal; Notable for the following components:   RBC 2.25 (*)    Hemoglobin 6.5 (*)    HCT 20.9 (*)    RDW 17.2 (*)    Platelets 92 (*)    All other components within normal limits  RESP PANEL BY RT-PCR (FLU A&B, COVID) [id number]  I-STAT BETA HCG BLOOD, ED ([facility], [facility], [facility] ONLY)  I-STAT BETA HCG BLOOD, ED ([facility], [facility], [facility] ONLY)  TYPE AND SCREEN  PREPARE RBC (CROSSMATCH)    EKG EKG Interpretation  Date/Time:  Thursday November 29 2020 16:47:30 EST  Ventricular Rate:  73 PR Interval:  132 QRS Duration: 58 QT Interval:  420 QTC Calculation: 462 R Axis:   12 Text Interpretation: Normal sinus rhythm Low voltage QRS Cannot rule out Anterior infarct , age undetermined Abnormal ECG Confirmed by Lennice Sites [phone]) on 11/29/2020 6:04:26 PM   Radiology DG Chest Portable 1 View  Result Date: 11/29/2020 CLINICAL DATA:  Volume overload. Anemia. 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 EXAM: PORTABLE CHEST 1 VIEW COMPARISON:  11/07/2020 FINDINGS: Cardiac enlargement. Bilateral pleural effusions, greater on the left.  Infiltration or atelectasis in the left base. Mild perihilar infiltration, possibly edema. No pneumothorax. IMPRESSION: Cardiac enlargement with bilateral pleural effusions, greater on the left. Infiltration or atelectasis in the left base. Progression since previous study. Electronically Signed   By: Lucienne Capers M.D.   On: 11/29/2020 18:38    Procedures .Critical Care Performed by: Lennice Sites, DO Authorized by: Lennice Sites, DO   Critical care provider statement:    Critical care time (minutes):  35   Critical care was necessary to treat or prevent imminent or life-threatening deterioration of the following conditions:  Renal failure   Critical care was time spent personally by me on the following activities:  Blood draw for specimens, discussions with consultants, discussions with primary provider, evaluation of patient's response to treatment, examination of patient, obtaining history from patient or surrogate, ordering and performing treatments and interventions, ordering and review of laboratory studies, ordering and review of radiographic studies, pulse oximetry and re-evaluation of patient's condition   I assumed direction of critical care for this patient from another provider in my specialty: no     (including critical care time)  Medications Ordered in ED Medications  0.9 %  sodium chloride infusion (has no administration in time range)  calcium gluconate inj 10% (1 g) URGENT USE ONLY! (1 g Intravenous Given 11/29/20 1820)  sodium zirconium cyclosilicate (LOKELMA) packet 10 g (10 g Oral Given 11/29/20 1933)  insulin aspart (novoLOG) injection 5 Units (5 Units Intravenous Given 11/29/20 1826)    And  dextrose 50 % solution 50 mL (50 mLs Intravenous Given 11/29/20 1823)  furosemide (LASIX) injection 80 mg (80 mg Intravenous Given 11/29/20 1933)  sodium bicarbonate injection 50 mEq (50 mEq Intravenous Given 11/29/20 1937)    ED Course  I have reviewed the triage vital  signs and the nursing notes.  Pertinent labs & imaging results that were available during my care of the patient were reviewed by me and considered in my medical decision making (see chart for details).    MDM Rules/Calculators/A&P                          Cathy [name] is a 39 year old female with history of lupus nephritis who presents to the ED with hyperkalemia. Patient hypertensive but otherwise normal vitals. Has had ongoing volume overload issues. Recent admission for the same. Saw nephrology yesterday who drew labs and her potassium was elevated. Continues to be 6.4 today. No obvious EKG changes. Will give calcium, Lasix, bicarb, insulin, dextrose, Lokelma. She also happens to have a low hemoglobin of 6.5 likely secondary to chronic kidney disease. No melena, no hematochezia. Will type and screen her 1 unit of packed red blood cells. Talked with Dr. Royce Macadamia with nephrology who will evaluate the patient. She is not currently on dialysis. She has been on Lasix with decent response however creatinine is worsening and lab work also not improving. No  signs of severe respiratory distress and overall is comfortable. To be admitted to hospitalist for further care.  This chart was dictated using voice recognition software.  Despite best efforts to proofread,  errors can occur which can change the documentation meaning.    Final Clinical Impression(s) / ED Diagnoses Final diagnoses:  Hypervolemia, unspecified hypervolemia type  Hyperkalemia  AKI (acute kidney injury) ([place])  Symptomatic anemia    Rx / DC Orders ED Discharge Orders    None       Lennice Sites, DO 11/29/20 1943

## 2020-11-30 ENCOUNTER — Other Ambulatory Visit: Payer: Self-pay

## 2020-11-30 ENCOUNTER — Inpatient Hospital Stay (HOSPITAL_COMMUNITY): Payer: Medicaid Other

## 2020-11-30 DIAGNOSIS — N179 Acute kidney failure, unspecified: Secondary | ICD-10-CM | POA: Diagnosis not present

## 2020-11-30 HISTORY — PX: IR US GUIDE VASC ACCESS RIGHT: IMG2390

## 2020-11-30 HISTORY — PX: IR FLUORO GUIDE CV LINE RIGHT: IMG2283

## 2020-11-30 LAB — HEPATITIS PANEL, ACUTE
HCV Ab: NONREACTIVE
Hep A IgM: NONREACTIVE
Hep B C IgM: NONREACTIVE
Hepatitis B Surface Ag: NONREACTIVE

## 2020-11-30 LAB — RENAL FUNCTION PANEL
Albumin: 2.2 g/dL — ABNORMAL LOW (ref 3.5–5.0)
Anion gap: 11 (ref 5–15)
BUN: 117 mg/dL — ABNORMAL HIGH (ref 6–20)
CO2: 19 mmol/L — ABNORMAL LOW (ref 22–32)
Calcium: 7.2 mg/dL — ABNORMAL LOW (ref 8.9–10.3)
Chloride: 109 mmol/L (ref 98–111)
Creatinine, Ser: 6.26 mg/dL — ABNORMAL HIGH (ref 0.44–1.00)
GFR, Estimated: 8 mL/min — ABNORMAL LOW (ref >60)
Glucose, Bld: 111 mg/dL — ABNORMAL HIGH (ref 70–99)
Phosphorus: 8.6 mg/dL — ABNORMAL HIGH (ref 2.5–4.6)
Potassium: 6.3 mmol/L (ref 3.5–5.1)
Sodium: 139 mmol/L (ref 135–145)

## 2020-11-30 LAB — CBC
HCT: 24.5 % — ABNORMAL LOW (ref 36.0–46.0)
Hemoglobin: 8.4 g/dL — ABNORMAL LOW (ref 12.0–15.0)
MCH: 29.8 pg (ref 26.0–34.0)
MCHC: 34.3 g/dL (ref 30.0–36.0)
MCV: 86.9 fL (ref 80.0–100.0)
Platelets: 96 10*3/uL — ABNORMAL LOW (ref 150–400)
RBC: 2.82 MIL/uL — ABNORMAL LOW (ref 3.87–5.11)
RDW: 16.5 % — ABNORMAL HIGH (ref 11.5–15.5)
WBC: 6.5 10*3/uL (ref 4.0–10.5)
nRBC: 0 % (ref 0.0–0.2)

## 2020-11-30 LAB — BASIC METABOLIC PANEL
Anion gap: 15 (ref 5–15)
BUN: 114 mg/dL — ABNORMAL HIGH (ref 6–20)
CO2: 17 mmol/L — ABNORMAL LOW (ref 22–32)
Calcium: 7.1 mg/dL — ABNORMAL LOW (ref 8.9–10.3)
Chloride: 105 mmol/L (ref 98–111)
Creatinine, Ser: 6.53 mg/dL — ABNORMAL HIGH (ref 0.44–1.00)
GFR, Estimated: 8 mL/min — ABNORMAL LOW (ref >60)
Glucose, Bld: 125 mg/dL — ABNORMAL HIGH (ref 70–99)
Potassium: 6.1 mmol/L — ABNORMAL HIGH (ref 3.5–5.1)
Sodium: 137 mmol/L (ref 135–145)

## 2020-11-30 LAB — BRAIN NATRIURETIC PEPTIDE: B Natriuretic Peptide: >4500 pg/mL — ABNORMAL HIGH (ref 0.0–100.0)

## 2020-11-30 LAB — POTASSIUM
Potassium: 5.9 mmol/L — ABNORMAL HIGH (ref 3.5–5.1)
Potassium: 5.8 mmol/L — ABNORMAL HIGH (ref 3.5–5.1)

## 2020-11-30 LAB — POC OCCULT BLOOD, ED: Fecal Occult Bld: NEGATIVE

## 2020-11-30 LAB — PROCALCITONIN: Procalcitonin: 0.64 ng/mL

## 2020-11-30 MED ORDER — DEXTROSE 50 % IV SOLN
1.0000 | Freq: Once | INTRAVENOUS | Status: AC
  Administered 2020-11-30: 50 mL via INTRAVENOUS
  Filled 2020-11-30: qty 50

## 2020-11-30 MED ORDER — HEPARIN SODIUM (PORCINE) 1000 UNIT/ML IJ SOLN
INTRAMUSCULAR | Status: AC
  Filled 2020-11-30: qty 1

## 2020-11-30 MED ORDER — LIDOCAINE-EPINEPHRINE 1 %-1:100000 IJ SOLN
INTRAMUSCULAR | Status: AC | PRN
  Administered 2020-11-30: 10 mL

## 2020-11-30 MED ORDER — LIDOCAINE-EPINEPHRINE 1 %-1:100000 IJ SOLN
INTRAMUSCULAR | Status: AC
  Filled 2020-11-30: qty 1

## 2020-11-30 MED ORDER — MIDAZOLAM HCL 2 MG/2ML IJ SOLN
INTRAMUSCULAR | Status: AC | PRN
  Administered 2020-11-30 (×2): 1 mg via INTRAVENOUS

## 2020-11-30 MED ORDER — FENTANYL CITRATE (PF) 100 MCG/2ML IJ SOLN
INTRAMUSCULAR | Status: AC | PRN
  Administered 2020-11-30: 50 ug via INTRAVENOUS
  Administered 2020-11-30 (×2): 25 ug via INTRAVENOUS

## 2020-11-30 MED ORDER — PANTOPRAZOLE SODIUM 40 MG IV SOLR
40.0000 mg | Freq: Two times a day (BID) | INTRAVENOUS | Status: DC
  Administered 2020-11-30 – 2020-12-01 (×4): 40 mg via INTRAVENOUS
  Filled 2020-11-30 (×4): qty 40

## 2020-11-30 MED ORDER — ALBUTEROL SULFATE (2.5 MG/3ML) 0.083% IN NEBU
10.0000 mg | INHALATION_SOLUTION | Freq: Once | RESPIRATORY_TRACT | Status: AC
  Administered 2020-11-30: 10 mg via RESPIRATORY_TRACT
  Filled 2020-11-30: qty 12

## 2020-11-30 MED ORDER — GELATIN ABSORBABLE 12-7 MM EX MISC
CUTANEOUS | Status: AC | PRN
  Administered 2020-11-30: 1 via TOPICAL

## 2020-11-30 MED ORDER — GELATIN ABSORBABLE 12-7 MM EX MISC
CUTANEOUS | Status: AC
  Filled 2020-11-30: qty 1

## 2020-11-30 MED ORDER — SODIUM ZIRCONIUM CYCLOSILICATE 10 G PO PACK: 10.0000 g | PACK | Freq: Once | ORAL | Status: DC

## 2020-11-30 MED ORDER — FENTANYL CITRATE (PF) 100 MCG/2ML IJ SOLN
25.0000 ug | INTRAMUSCULAR | Status: AC | PRN
Start: 2020-11-30 — End: 2020-11-30
  Administered 2020-11-30 (×3): 50 ug via INTRAVENOUS
  Filled 2020-11-30 (×3): qty 2

## 2020-11-30 MED ORDER — KIDNEY FAILURE BOOK: Freq: Once | Status: AC

## 2020-11-30 MED ORDER — MIDAZOLAM HCL 2 MG/2ML IJ SOLN
INTRAMUSCULAR | Status: AC
  Filled 2020-11-30: qty 2

## 2020-11-30 MED ORDER — FENTANYL CITRATE (PF) 100 MCG/2ML IJ SOLN
INTRAMUSCULAR | Status: AC
  Filled 2020-11-30: qty 2

## 2020-11-30 MED ORDER — FUROSEMIDE 10 MG/ML IJ SOLN
80.0000 mg | Freq: Once | INTRAMUSCULAR | Status: AC
  Administered 2020-11-30: 80 mg via INTRAVENOUS
  Filled 2020-11-30: qty 8

## 2020-11-30 MED ORDER — SODIUM CHLORIDE 0.9 % IV SOLN
1.0000 g | Freq: Every day | INTRAVENOUS | Status: DC
  Administered 2020-11-30 – 2020-12-03 (×5): 1 g via INTRAVENOUS
  Filled 2020-11-30 (×4): qty 1
  Filled 2020-11-30 (×2): qty 10

## 2020-11-30 MED ORDER — INSULIN ASPART 100 UNIT/ML IV SOLN
5.0000 [IU] | Freq: Once | INTRAVENOUS | Status: AC
  Administered 2020-11-30: 5 [IU] via INTRAVENOUS

## 2020-11-30 MED ORDER — SODIUM ZIRCONIUM CYCLOSILICATE 10 G PO PACK
20.0000 g | PACK | Freq: Once | ORAL | Status: AC
  Administered 2020-11-30: 20 g via ORAL
  Filled 2020-11-30: qty 2

## 2020-11-30 MED ORDER — SODIUM CHLORIDE 0.9 % IV SOLN
500.0000 mg | Freq: Every day | INTRAVENOUS | Status: DC
  Administered 2020-11-30 – 2020-12-03 (×5): 500 mg via INTRAVENOUS
  Filled 2020-11-30 (×6): qty 500

## 2020-11-30 NOTE — Progress Notes (Signed)
Nephrology Follow-Up Consult note   Assessment/Recommendations: Cathy [name] is a/an 39 y.o. female with a past medical history significant for SLE, HTN, CKD, admitted for volume overload, hyperkalemia, renal failure, presumptive lupus nephritis.     Non-oliguric AKI, severe: Likely secondary to lupus nephritis ut other factors contributing. Given severe electrolyte derangements will plan on dialysis. Hopefully temporary -Dialysis today and tomorrow; IR placing [facility] today appreciate help -Likely has SLE nephritis; plan to optimize with dialysis and biopsy on Monday -1g solumedrol x3 days then daily pred 40mg  -Bactrim ppx once K improves; on ppi -Possibly cytoxan based on biopsy; may have severe chronic changes -Continue to monitor daily Cr, Dose meds for GFR -Monitor Daily I/Os, Daily weight  -Maintain MAP>65 for optimal renal perfusion.  -Avoid nephrotoxic medications including NSAIDs and Vanc/Zosyn combo -quant gold and hepatitis serologies  Hyperkalemia: 2/2 AKI. Multiple medications given. Repeat lokelma and plan for dialysis today  Volume overload: likely 2/2 ARF and nephrotic syndrome -Dialysis as above -Lasix as needed  SLE: known lupus but no kidney biopsy due to compliance issues in the past. Labs on 11/07/20 ANA+, Anti-dsDNA 31, +anti-centromere, chromatin, smith, ro, la. Plan for kidney biopsy, steroids, and further treatment based on results.  Hypertension: likely will improve w/ volume optimization. Steroids contributing -continue norvasc 10mg  daily, labetalol 400mg  BID  Acidosis: 2/2 aki. Bicarb 1300mg  BID  PNA?: procalcitonin elevated. abx per primary  Anemia due to CKD and chronic inflammation: -Transfuse for Hgb<7 g/dL; ctm    Recommendations conveyed to primary service.    [facility] 11/30/2020 11:09 AM  ___________________________________________________________  CC: AKI, hyperkalemia  Interval History/Subjective:  Patient feels well today. Feels like her fluid status has improved. Some nausea. No chest pain or fevers.   Medications:  Current Facility-Administered Medications  Medication Dose Route Frequency Provider Last Rate Last Admin  . 0.9 %  sodium chloride infusion  10 mL/hr Intravenous Once Shela Leff, MD      . acetaminophen (TYLENOL) tablet 650 mg  650 mg Oral Q6H PRN Shela Leff, MD       Or  . acetaminophen (TYLENOL) suppository 650 mg  650 mg Rectal Q6H PRN Shela Leff, MD      . albuterol (VENTOLIN HFA) 108 (90 Base) MCG/ACT inhaler 1-2 puff  1-2 puff Inhalation Q4H PRN Shela Leff, MD      . amLODipine (NORVASC) tablet 10 mg  10 mg Oral Daily Shela Leff, MD   10 mg at 11/30/20 1055  . azithromycin (ZITHROMAX) 500 mg in sodium chloride 0.9 % 250 mL IVPB  500 mg Intravenous QHS Shela Leff, MD 250 mL/hr at 11/30/20 0330 500 mg at 11/30/20 0330  . cefTRIAXone (ROCEPHIN) 1 g in sodium chloride 0.9 % 100 mL IVPB  1 g Intravenous QHS Shela Leff, MD 200 mL/hr at 11/30/20 0330 1 g at 11/30/20 0330  . Chlorhexidine Gluconate Cloth 2 % PADS 6 each  6 each Topical Q0600 Claudia Desanctis, MD   6 each at 11/30/20 0534  . fentaNYL (SUBLIMAZE) injection 25-50 mcg  25-50 mcg Intravenous Q2H PRN Opyd, Ilene Qua, MD   50 mcg at 11/30/20 0533  . labetalol (NORMODYNE) tablet 400 mg  400 mg Oral BID Shela Leff, MD   400 mg at 11/30/20 1055  . methylPREDNISolone sodium succinate (SOLU-MEDROL) 1,000 mg in sodium chloride 0.9 % 50 mL IVPB  1,000 mg Intravenous Q24H Claudia Desanctis, MD 58 mL/hr at 11/29/20 2349 1,000 mg at 11/29/20 2349  .  pantoprazole (PROTONIX) EC tablet 40 mg  40 mg Oral Daily Claudia Desanctis, MD   40 mg at 11/30/20 1055  . polyethylene glycol (MIRALAX / GLYCOLAX) packet 17 g  17 g Oral Daily PRN Shela Leff, MD      . sodium bicarbonate tablet 1,300 mg  1,300 mg Oral BID Shela Leff, MD   1,300 mg at 11/30/20 1055       Review of Systems: 10 systems reviewed and negative except per interval history/subjective  Physical Exam: Vitals:   11/30/20 0552 11/30/20 0826  BP: (!) 159/120 (!) 188/121  Pulse: 69 67  Resp: 20 16  Temp: 97.7 F (36.5 C) 98.5 F (36.9 C)  SpO2: 100% 100%   Total I/O In: 0  Out: 200 [Urine:200]  Intake/Output Summary (Last 24 hours) at 11/30/2020 1109 Last data filed at 11/30/2020 0900 Gross per 24 hour  Intake 1260 ml  Output 800 ml  Net 460 ml   Constitutional: well-appearing, no acute distress ENMT: ears and nose without scars or lesions, MMM CV: normal rate, 2+ pitting edema in BLE Respiratory:bilateral chest rise, normal work of breathing Gastrointestinal: soft, non-tender, no palpable masses or hernias Skin: no visible lesions or rashes Psych: alert, judgement/insight appropriate, appropriate mood and affect   Test Results I personally reviewed new and old clinical labs and radiology tests Lab Results  Component Value Date   NA 139 11/30/2020   K 6.3 (HH) 11/30/2020   CL 109 11/30/2020   CO2 19 (L) 11/30/2020   BUN 117 (H) 11/30/2020   CREATININE 6.26 (H) 11/30/2020   CALCIUM 7.2 (L) 11/30/2020   ALBUMIN 2.2 (L) 11/30/2020   PHOS 8.6 (H) 11/30/2020

## 2020-11-30 NOTE — Progress Notes (Signed)
Rounded on patient today in correlation to transition to outpatient HD. Patient is found laying in bed and consents to renal education. Ordered consult to dietician and Kidney Failure book. Patient educated at the bedside regarding care of tunneled dialysis catheter, AV fistula/graft site care, assessment of thrill daily and proper medication administration on HD days.  Patient also educated on the importance of adhering to scheduled dialysis treatments, the effects of fluid overload, hyperkalemia and hyperphosphatemia. Patient capable of re-verbalizing via teach back method. Patient verbalizes that she is familiar with HD because her grandfather was on HD years ago. Also educated patient on services available through the interdisciplinary team in the clinic setting and the differences they will note when transitioning to the outpatient setting from the hospital. Patient with no further questions at this time. Handouts and contact information provided to patient for any further assistance. Will follow as appropriate.   Dorthey Sawyer, RN  Dialysis Nurse Coordinator Phone: [phone]

## 2020-11-30 NOTE — Procedures (Signed)
Interventional Radiology Procedure Note  Procedure: Placement of a 19 cm Palindrome tunneled HD catheter,  Tip sat cavoatrial junction, ready for use.   Complications: None  Estimated Blood Loss: None  Recommendations: - Routine line care   Signed,  Criselda Peaches, MD

## 2020-11-30 NOTE — Progress Notes (Signed)
PROGRESS NOTE    Cathy [name]  HBZ:[id number] DOB: 02/18/1981 DOA: 11/29/2020 PCP: Patient, No Pcp Per   Brief Narrative: 39 year old with past medical history significant for SLE, CKD stage III AAA/lupus nephritis, hypertension, asthma, substance abuse, bipolar disorder, depression, history of medication noncompliance presents complaining of shortness of breath and bilateral lower extremity edema.  Patient was referred to the ED by her nephrologist due to worsening kidney function and lower extremity edema. Is currently on a prednisone taper for her lupus and reports compliance.  She has not seen a rheumatologist yet.  Of note patient with recent admission from 12/8 on 1212/14 for anasarca and AKI on CKD stage III AAA.  Nephrology and cardiology were consulted.  Her anasarca improved with IV diuretics.  Potassium of 6.4, bicarb 18, creatinine 6.5.  Covid negative.  Chest x-ray showed bilateral pleural effusion. Received IV Lexis, calcium gluconate, IV bicarb Lokelma.   Assessment & Plan:   Principal Problem:   Acute renal failure (ARF) (HCC) Active Problems:   Lupus (HCC)   Anemia   Volume overload   Hyperkalemia   1-AKI on CKD stage IIIa lupus/lupus nephritis: Present with worsening renal function Nephrology is planning for hemodialysis catheter placement and initiation of hemodialysis.  2-Hyperkalemia: See if Lokelma this morning.  On admission received bicarbonate, Lokelma, calcium gluconate. Give insulin, amp of glucose and albuterol Plan for hemodialysis after hemodialysis catheter placement  3-Normal anion gap metabolic acidosis ; Likely related to AKI. Continue with sodium bicarb tablet  4-Volume overload/anasarca: Secondary to acute renal failure: Received IV Lexis.  Patient will be initiated on hemodialysis  5-Hypertensive urgency: Continue with amlodipine and labetalol.  As needed hydralazine  6-Lupus: Was  On prednisone taper out patient.  was a started  on high-dose Solu-Medrol. Will need to transition to oral prednsione Follow-up with rheumatology  7-Thrombocytopenia: Likely secondary to AKI: Monitor  8-Community-acquired pneumonia: Abnormal chest x-ray showed infiltrates: Continue with IV antibiotics.  Procalcitonin elevated.  9-Pericardial effusion: Recent echo 12/16 revealed moderate pericardial effusion. No evidence of Tamponade hypotension tachycardia  Epigastric pain: Start PPI   Estimated body mass index is 34.18 kg/m as calculated from the following:   Height as of this encounter: 5\' 7"  (1.702 m).   Weight as of this encounter: 99 kg.   DVT prophylaxis: SCDs Code Status: Full code Family Communication: Care discussed with patient Disposition Plan:  Status is: Inpatient  Remains inpatient appropriate because:IV treatments appropriate due to intensity of illness or inability to take PO   Dispo: The patient is from: Home              Anticipated d/c is to: Home              Anticipated d/c date is: 3 days              Patient currently is not medically stable to d/c.        Consultants:   Nephrology  IR  Procedures:   HD catheter   Antimicrobials:    Subjective: She is sleepy, she wake up answer questions.  She is complaining of epigastric pain.  Objective: Vitals:   11/29/20 2344 11/30/20 0100 11/30/20 0552 11/30/20 0704  BP: (!) 159/104 (!) 156/117 (!) 159/120   Pulse: 81 88 69   Resp: 20 20 20    Temp: 97.6 F (36.4 C) 99.2 F (37.3 C) 97.7 F (36.5 C)   TempSrc: Oral  Oral   SpO2: 100% 100% 100%   Weight:  99 kg  Height:    5\' 7"  (1.702 m)    Intake/Output Summary (Last 24 hours) at 11/30/2020 0725 Last data filed at 11/30/2020 0600 Gross per 24 hour  Intake 1260 ml  Output 600 ml  Net 660 ml   Filed Weights   11/30/20 0704  Weight: 99 kg    Examination:  General exam: Appears calm and comfortable  Respiratory system: BL crackles.  Cardiovascular system: S1 & S2  heard, RRR. No JVD, murmurs, rubs, gallops or clicks. No pedal edema. Gastrointestinal system: Abdomen is nondistended, soft and nontender. No organomegaly or masses felt. Normal bowel sounds heard. Central nervous system: Alert and oriented. No focal neurological deficits. Extremities: Symmetric 5 x 5 power.   Data Reviewed: I have personally reviewed following labs and imaging studies  CBC: Recent Labs  Lab 11/29/20 1701 11/30/20 0319  WBC 7.7 6.5  HGB 6.5* 8.4*  HCT 20.9* 24.5*  MCV 92.9 86.9  PLT 92* 96*   Basic Metabolic Panel: Recent Labs  Lab 11/29/20 1701 11/29/20 2058 11/30/20 0319  NA 140  --  139  K 6.4* 5.8* 6.3*  CL 111  --  109  CO2 18*  --  19*  GLUCOSE 118*  --  111*  BUN 119*  --  117*  CREATININE 6.59*  --  6.26*  CALCIUM 7.3*  --  7.2*  PHOS  --   --  8.6*   GFR: Estimated Creatinine Clearance: 14.9 mL/min (A) (by C-G formula based on SCr of 6.26 mg/dL (H)). Liver Function Tests: Recent Labs  Lab 11/30/20 0319  ALBUMIN 2.2*   No results for input(s): LIPASE, AMYLASE in the last 168 hours. No results for input(s): AMMONIA in the last 168 hours. Coagulation Profile: No results for input(s): INR, PROTIME in the last 168 hours. Cardiac Enzymes: No results for input(s): CKTOTAL, CKMB, CKMBINDEX, TROPONINI in the last 168 hours. BNP (last 3 results) No results for input(s): PROBNP in the last 8760 hours. HbA1C: No results for input(s): HGBA1C in the last 72 hours. CBG: Recent Labs  Lab 11/29/20 1943  GLUCAP 83   Lipid Profile: No results for input(s): CHOL, HDL, LDLCALC, TRIG, CHOLHDL, LDLDIRECT in the last 72 hours. Thyroid Function Tests: No results for input(s): TSH, T4TOTAL, FREET4, T3FREE, THYROIDAB in the last 72 hours. Anemia Panel: No results for input(s): VITAMINB12, FOLATE, FERRITIN, TIBC, IRON, RETICCTPCT in the last 72 hours. Sepsis Labs: Recent Labs  Lab 11/29/20 2058  PROCALCITON 0.64    Recent Results (from the past  240 hour(s))  Resp Panel by RT-PCR (Flu A&B, Covid) Nasopharyngeal Swab     Status: None   Collection Time: 11/29/20  7:24 PM   Specimen: Nasopharyngeal Swab; Nasopharyngeal(NP) swabs in vial transport medium  Result Value Ref Range Status   SARS Coronavirus 2 by RT PCR NEGATIVE NEGATIVE Final    Comment: (NOTE) SARS-CoV-2 target nucleic acids are NOT DETECTED.  The SARS-CoV-2 RNA is generally detectable in upper respiratory specimens during the acute phase of infection. The lowest concentration of SARS-CoV-2 viral copies this assay can detect is 138 copies/mL. A negative result does not preclude SARS-Cov-2 infection and should not be used as the sole basis for treatment or other patient management decisions. A negative result may occur with  improper specimen collection/handling, submission of specimen other than nasopharyngeal swab, presence of viral mutation(s) within the areas targeted by this assay, and inadequate number of viral copies(<138 copies/mL). A negative result must be combined with clinical observations,  patient history, and epidemiological information. The expected result is Negative.  Fact Sheet for Patients:  EntrepreneurPulse.com.au  Fact Sheet for Healthcare Providers:  IncredibleEmployment.be  This test is no t yet approved or cleared by the [place] FDA and  has been authorized for detection and/or diagnosis of SARS-CoV-2 by FDA under an Emergency Use Authorization (EUA). This EUA will remain  in effect (meaning this test can be used) for the duration of the COVID-19 declaration under Section 564(b)(1) of the Act, 21 U.S.C.section 360bbb-3(b)(1), unless the authorization is terminated  or revoked sooner.       Influenza A by PCR NEGATIVE NEGATIVE Final   Influenza B by PCR NEGATIVE NEGATIVE Final    Comment: (NOTE) The Xpert Xpress SARS-CoV-2/FLU/RSV plus assay is intended as an aid in the diagnosis of influenza  from Nasopharyngeal swab specimens and should not be used as a sole basis for treatment. Nasal washings and aspirates are unacceptable for Xpert Xpress SARS-CoV-2/FLU/RSV testing.  Fact Sheet for Patients: EntrepreneurPulse.com.au  Fact Sheet for Healthcare Providers: IncredibleEmployment.be  This test is not yet approved or cleared by the [place] FDA and has been authorized for detection and/or diagnosis of SARS-CoV-2 by FDA under an Emergency Use Authorization (EUA). This EUA will remain in effect (meaning this test can be used) for the duration of the COVID-19 declaration under Section 564(b)(1) of the Act, 21 U.S.C. section 360bbb-3(b)(1), unless the authorization is terminated or revoked.  Performed at [facility], [place] [address], [place], [place] [address]          Radiology Studies: US RENAL  Result Date: 11/30/2020 CLINICAL DATA:  Acute kidney injury EXAM: RENAL / URINARY TRACT ULTRASOUND COMPLETE COMPARISON:  None. FINDINGS: Right Kidney: Renal measurements: 11.8 x 7.2 x 5.7 cm = volume: 256 mL. Echogenicity within normal limits. Mild right pelviectasis is seen. Left Kidney: Renal measurements: 10.1 x 5.9 x 4.3 cm = volume: 161 mL. Echogenicity within normal limits. No mass or hydronephrosis visualized. Bladder: Appears normal for degree of bladder distention. Other: Abdominopelvic ascites is noted. Bilateral pleural effusions are seen. IMPRESSION: Nonspecific mild right pelviectasis. Abdominopelvic ascites Small bilateral pleural effusions. Electronically Signed   By: Prudencio Pair M.D.   On: 11/30/2020 01:20   DG Chest Portable 1 View  Result Date: 11/29/2020 CLINICAL DATA:  Volume overload. Anemia. 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 EXAM: PORTABLE CHEST 1 VIEW COMPARISON:  11/07/2020 FINDINGS: Cardiac enlargement. Bilateral pleural effusions, greater on the left. Infiltration or atelectasis in the left base. Mild  perihilar infiltration, possibly edema. No pneumothorax. IMPRESSION: Cardiac enlargement with bilateral pleural effusions, greater on the left. Infiltration or atelectasis in the left base. Progression since previous study. Electronically Signed   By: Lucienne Capers M.D.   On: 11/29/2020 18:38        Scheduled Meds: . amLODipine  10 mg Oral Daily  . Chlorhexidine Gluconate Cloth  6 each Topical Q0600  . labetalol  400 mg Oral BID  . pantoprazole  40 mg Oral Daily  . sodium bicarbonate  1,300 mg Oral BID  . sodium zirconium cyclosilicate  20 g Oral Once   Continuous Infusions: . sodium chloride    . azithromycin 500 mg (11/30/20 0330)  . cefTRIAXone (ROCEPHIN)  IV 1 g (11/30/20 0330)  . methylPREDNISolone (SOLU-MEDROL) injection 1,000 mg (11/29/20 2349)     LOS: 1 day    Time spent: 35 minutes.     Elmarie Shiley, MD [facility]   If  7PM-7AM, please contact night-coverage [email or web address]  11/30/2020, 7:25 AM

## 2020-11-30 NOTE — Progress Notes (Addendum)
[place] for Restarting Anticoagulants/Antiplatelet Drugs Post-IR Procedure  No Known Allergies  Patient Measurements: Height: 5\' 7"  (170.2 cm) Weight: 99 kg (218 lb 4.1 oz) IBW/kg (Calculated) : 61.6  Vital Signs: Temp: 98.5 F (36.9 C) (12/31 0826) Temp Source: Oral (12/31 0826) BP: 137/109 (12/31 1705) Pulse Rate: 61 (12/31 1705)  Labs: Recent Labs    11/29/20 1701 11/30/20 0319 11/30/20 1047  HGB 6.5* 8.4*  --   HCT 20.9* 24.5*  --   PLT 92* 96*  --   CREATININE 6.59* 6.26* 6.53*    Estimated Creatinine Clearance: 14.3 mL/min (A) (by C-G formula based on SCr of 6.53 mg/dL (H)).   Medical History: Past Medical History:  Diagnosis Date  . Anxiety   . Asthma   . Bipolar depression ([place])   . Depression   . Gonorrhea   . Lupus ([place])   . Pelvic inflammatory disease (PID)   . Renal hypertension   . Trichomonas infection    Assessment: 39 yr old woman S/P placement of tunneled HD catheter this afternoon by IR. Pharmacy was consulted to restart anticoagulants/antiplatelet drugs post-IR procedure (per consult info, procedure was standard bleeding risk procedure).  Upon review of pt's current and previous medications during this hospitalization, pt was not on anticoagulant/antiplatelet drugs prior to IR procedure. Pt has order for SCDs for VTE prophylaxis.   Gillermina Hu, PharmD, BCPS, [facility] Clinical Pharmacist 11/30/2020,5:16 PM

## 2020-11-30 NOTE — Consult Note (Signed)
Patient Status: [facility] - In-pt  Assessment and Plan: History of AKI (presumed secondary to lupus nephritis) in fluid overload in need of HD access.  Plan for image-guided tunneled HD catheter placement today in IR. Patient is NPO. Afebrile.  Risks and benefits discussed with the patient including, but not limited to bleeding, infection, vascular injury, pneumothorax which may require chest tube placement, air embolism or even death. All of the patient's questions were answered, patient is agreeable to proceed. Consent signed and in IR control room.   ______________________________________________________________________   History of Present Illness: Cathy [name] is a 39 y.o. female with a past medical history of hypertension, asthma, SLE with associated lupus nephritis, PID, bipolar disorder, anxiety, and depression. Of note, patient was recently admitted to [facility] 11/07/2020 to 11/13/2020 for management of AKI on CKD/lupus nephritis. She improved with IV diuretics. She presented to [facility] ED 11/29/2020 with complaint of dyspnea. In ED, patient found to be in fluid overload (anascarca, pericardial effusion) along with hyperkalemia. She was admitted for further management and started on IV lasix, calcium gluconate, IV sodium bicarbonate, insulin/dextrose, and lokelma. Nephrology was consulted who recommended initiation of HD along with IR consult for possible tunneled HD catheter placement.  IR consulted by Dr. Royce Macadamia for possible image-guided tunneled HD catheter placement. Patient awake and alert sitting in bed. Complains of dyspnea, improved since admission. Denies fever, chills, chest pain, abdominal pain, or headache.   Allergies and medications reviewed.   Review of Systems: A 12 point ROS discussed and pertinent positives are indicated in the HPI above.  All other systems are negative.  Review of Systems  Constitutional: Negative for chills and fever.  Respiratory: Positive for  shortness of breath. Negative for wheezing.   Cardiovascular: Negative for chest pain and palpitations.  Gastrointestinal: Negative for abdominal pain.  Neurological: Negative for headaches.  Psychiatric/Behavioral: Negative for behavioral problems and confusion.    Vital Signs: BP (!) 188/121 (BP Location: Left Arm)   Pulse 67   Temp 98.5 F (36.9 C) (Oral)   Resp 16   Ht 5\' 7"  (1.702 m)   Wt 218 lb 4.1 oz (99 kg)   SpO2 100%   BMI 34.18 kg/m   Physical Exam Vitals and nursing note reviewed.  Constitutional:      General: She is not in acute distress. Cardiovascular:     Rate and Rhythm: Normal rate and regular rhythm.     Heart sounds: Normal heart sounds. No murmur heard.   Pulmonary:     Effort: Pulmonary effort is normal. No respiratory distress.     Breath sounds: Normal breath sounds. No wheezing.  Musculoskeletal:     Right lower leg: Edema present.     Left lower leg: Edema present.  Skin:    General: Skin is warm and dry.  Neurological:     Mental Status: She is alert and oriented to person, place, and time.      Imaging reviewed.   Labs:  COAGS: No results for input(s): INR, APTT in the last 8760 hours.  BMP: Recent Labs    08/07/20 1209 10/17/20 1015 11/12/20 0526 11/13/20 0548 11/29/20 1701 11/29/20 2058 11/30/20 0319  NA 138   < > 137 138 140  --  139  K 3.6   < > 4.9 4.8 6.4* 5.8* 6.3*  CL 109   < > 109 111 111  --  109  CO2 21*   < > 17* 16* 18*  --  19*  GLUCOSE 87   < > 113* 130* 118*  --  111*  BUN 9   < > 78* 82* 119*  --  117*  CALCIUM 8.4*   < > 7.4* 7.5* 7.3*  --  7.2*  CREATININE 0.80   < > 2.88* 2.99* 6.59*  --  6.26*  GFRNONAA >60   < > 21* 20* 8*  --  8*  GFRAA >60  --   --   --   --   --   --    < > = values in this interval not displayed.       Electronically Signed: Earley Abide, PA-C 11/30/2020, 9:59 AM   I spent a total of 15 minutes in face to face in clinical consultation, greater than 50% of which was  counseling/coordinating care for HD access.

## 2020-11-30 NOTE — Plan of Care (Signed)
Nutrition Education Note  RD consulted for Renal Education.   Case discussed with renal navigator RN prior to visit. She confirms pt with lupus and plan for short term HD for AKI.   Spoke with pt at bedside, who reports feeling anxious for HD cath placement today. She shares that she is a little fearful about new life changes related to HD, but has a good support system (mom and sister) and she is trying to stay strong for her kids (ages 65, 69, and 75).   Pt reports she has a great appetite and was mostly cooking from home PTA. Pt reports she consumes a lot of fresh fruits and vegetables, but also consuming a lot of processed foods, such as oodles of noodles and using salt based seasonings in cooking. Focus of education was how to decrease sodium in diet. Reviewed diet recall and discussed ways to decrease K and Phos in diet. Enforced to pt to that K and Phos intake are individualized based upon labs values and that she will have access to renal RD at outpatient HD center for further guidance at discharge. RD provided pt with emotional support and encouragement.   Provided "Food Pyramid for Healthy Eating with Kidney Disease" to patient/family. Reviewed food groups and provided written recommended serving sizes specifically determined for patient's current nutritional status.   Explained why diet restrictions are needed and provided lists of foods to limit/avoid that are high potassium, sodium, and phosphorus. Provided specific recommendations on safer alternatives of these foods. Strongly encouraged compliance of this diet.   Discussed importance of protein intake at each meal and snack. Provided examples of how to maximize protein intake throughout the day. Discussed need for fluid restriction with dialysis, importance of minimizing weight gain between HD treatments, and renal-friendly beverage options.  Encouraged pt to discuss specific diet questions/concerns with RD at HD outpatient facility.  Teach back method used.  Expect fair compliance.  Current diet order is NPO, patient is consuming approximately n/a% of meals at this time. Labs and medications reviewed. No further nutrition interventions warranted at this time. RD contact information provided. If additional nutrition issues arise, please re-consult RD.  Loistine Chance, RD, LDN, [place] Registered Dietitian II Certified Diabetes Care and Education Specialist Please refer to [facility] for RD and/or RD on-call/weekend/after hours pager

## 2020-12-01 DIAGNOSIS — N179 Acute kidney failure, unspecified: Secondary | ICD-10-CM | POA: Diagnosis not present

## 2020-12-01 LAB — BASIC METABOLIC PANEL
Anion gap: 14 (ref 5–15)
BUN: 96 mg/dL — ABNORMAL HIGH (ref 6–20)
CO2: 17 mmol/L — ABNORMAL LOW (ref 22–32)
Calcium: 6.8 mg/dL — ABNORMAL LOW (ref 8.9–10.3)
Chloride: 105 mmol/L (ref 98–111)
Creatinine, Ser: 6.09 mg/dL — ABNORMAL HIGH (ref 0.44–1.00)
GFR, Estimated: 9 mL/min — ABNORMAL LOW (ref >60)
Glucose, Bld: 167 mg/dL — ABNORMAL HIGH (ref 70–99)
Potassium: 5.2 mmol/L — ABNORMAL HIGH (ref 3.5–5.1)
Sodium: 136 mmol/L (ref 135–145)

## 2020-12-01 LAB — HEPATITIS B SURFACE ANTIBODY,QUALITATIVE: Hep B S Ab: REACTIVE — AB

## 2020-12-01 LAB — CBC
HCT: 25.3 % — ABNORMAL LOW (ref 36.0–46.0)
Hemoglobin: 8.3 g/dL — ABNORMAL LOW (ref 12.0–15.0)
MCH: 29.6 pg (ref 26.0–34.0)
MCHC: 32.8 g/dL (ref 30.0–36.0)
MCV: 90.4 fL (ref 80.0–100.0)
Platelets: 128 10*3/uL — ABNORMAL LOW (ref 150–400)
RBC: 2.8 MIL/uL — ABNORMAL LOW (ref 3.87–5.11)
RDW: 16.5 % — ABNORMAL HIGH (ref 11.5–15.5)
WBC: 9.6 10*3/uL (ref 4.0–10.5)
nRBC: 0 % (ref 0.0–0.2)

## 2020-12-01 LAB — RENAL FUNCTION PANEL
Albumin: 1.9 g/dL — ABNORMAL LOW (ref 3.5–5.0)
Anion gap: 14 (ref 5–15)
BUN: 123 mg/dL — ABNORMAL HIGH (ref 6–20)
CO2: 19 mmol/L — ABNORMAL LOW (ref 22–32)
Calcium: 6.8 mg/dL — ABNORMAL LOW (ref 8.9–10.3)
Chloride: 106 mmol/L (ref 98–111)
Creatinine, Ser: 6.74 mg/dL — ABNORMAL HIGH (ref 0.44–1.00)
GFR, Estimated: 8 mL/min — ABNORMAL LOW (ref >60)
Glucose, Bld: 122 mg/dL — ABNORMAL HIGH (ref 70–99)
Phosphorus: 9.2 mg/dL — ABNORMAL HIGH (ref 2.5–4.6)
Potassium: 5.7 mmol/L — ABNORMAL HIGH (ref 3.5–5.1)
Sodium: 139 mmol/L (ref 135–145)

## 2020-12-01 LAB — C3 COMPLEMENT: C3 Complement: 55 mg/dL — ABNORMAL LOW (ref 82–167)

## 2020-12-01 LAB — HEPATITIS B SURFACE ANTIGEN: Hepatitis B Surface Ag: NONREACTIVE

## 2020-12-01 LAB — C4 COMPLEMENT: Complement C4, Body Fluid: 5 mg/dL — ABNORMAL LOW (ref 12–38)

## 2020-12-01 LAB — HEPATITIS B CORE ANTIBODY, TOTAL: Hep B Core Total Ab: NONREACTIVE

## 2020-12-01 MED ORDER — SODIUM ZIRCONIUM CYCLOSILICATE 10 G PO PACK
10.0000 g | PACK | Freq: Once | ORAL | Status: AC
  Administered 2020-12-01: 10 g via ORAL
  Filled 2020-12-01: qty 1

## 2020-12-01 MED ORDER — ALTEPLASE 2 MG IJ SOLR: 2.0000 mg | Freq: Once | INTRAMUSCULAR | Status: DC | PRN

## 2020-12-01 MED ORDER — HEPARIN SODIUM (PORCINE) 1000 UNIT/ML DIALYSIS: 1000.0000 [IU] | INTRAMUSCULAR | Status: DC | PRN

## 2020-12-01 MED ORDER — HEPARIN SODIUM (PORCINE) 1000 UNIT/ML IJ SOLN
INTRAMUSCULAR | Status: AC
  Filled 2020-12-01: qty 4

## 2020-12-01 MED ORDER — FUROSEMIDE 10 MG/ML IJ SOLN
120.0000 mg | Freq: Two times a day (BID) | INTRAVENOUS | Status: DC
  Administered 2020-12-01 – 2020-12-07 (×12): 120 mg via INTRAVENOUS
  Filled 2020-12-01 (×4): qty 12
  Filled 2020-12-01: qty 2
  Filled 2020-12-01: qty 12
  Filled 2020-12-01: qty 2
  Filled 2020-12-01 (×2): qty 10
  Filled 2020-12-01: qty 12
  Filled 2020-12-01 (×4): qty 10

## 2020-12-01 MED ORDER — FENTANYL CITRATE (PF) 100 MCG/2ML IJ SOLN
25.0000 ug | INTRAMUSCULAR | Status: AC | PRN
  Administered 2020-12-01 (×3): 50 ug via INTRAVENOUS
  Filled 2020-12-01 (×4): qty 2

## 2020-12-01 MED ORDER — SODIUM CHLORIDE 0.9 % IV SOLN
INTRAVENOUS | Status: DC | PRN
  Administered 2020-12-01 – 2020-12-07 (×4): 250 mL via INTRAVENOUS

## 2020-12-01 MED ORDER — PENTAFLUOROPROP-TETRAFLUOROETH EX AERO: 1.0000 "application " | INHALATION_SPRAY | CUTANEOUS | Status: DC | PRN

## 2020-12-01 MED ORDER — SODIUM CHLORIDE 0.9 % IV SOLN: 100.0000 mL | INTRAVENOUS | Status: DC | PRN

## 2020-12-01 MED ORDER — LIDOCAINE-PRILOCAINE 2.5-2.5 % EX CREA: 1.0000 "application " | TOPICAL_CREAM | CUTANEOUS | Status: DC | PRN

## 2020-12-01 MED ORDER — LIDOCAINE HCL (PF) 1 % IJ SOLN
5.0000 mL | INTRAMUSCULAR | Status: DC | PRN
Start: 2020-12-01 — End: 2020-12-01

## 2020-12-01 NOTE — Progress Notes (Signed)
Pt irate before going to [facility] pt request pain med had to page MD for pain med, explained to pt waiting for MD to put order in pt became verbally abusive and yelling over me trying to [place] why I cannot give her pain med.after pain med given pt apologized will continue to monitor

## 2020-12-01 NOTE — Progress Notes (Incomplete)
Pt

## 2020-12-01 NOTE — Progress Notes (Signed)
PROGRESS NOTE    Cathy [name]  WGY:[id number] DOB: 06-26-1981 DOA: 11/29/2020 PCP: Patient, No Pcp Per   Brief Narrative: 40 year old with past medical history significant for SLE, CKD stage III AAA/lupus nephritis, hypertension, asthma, substance abuse, bipolar disorder, depression, history of medication noncompliance presents complaining of shortness of breath and bilateral lower extremity edema.  Patient was referred to the ED by her nephrologist due to worsening kidney function and lower extremity edema. Is currently on a prednisone taper for her lupus and reports compliance.  She has not seen a rheumatologist yet.  Of note patient with recent admission from 12/8 on 1212/14 for anasarca and AKI on CKD stage III AAA.  Nephrology and cardiology were consulted.  Her anasarca improved with IV diuretics.  Potassium of 6.4, bicarb 18, creatinine 6.5.  Covid negative.  Chest x-ray showed bilateral pleural effusion. Received IV Lexis, calcium gluconate, IV bicarb Lokelma.   Assessment & Plan:   Principal Problem:   Acute renal failure (ARF) (HCC) Active Problems:   Lupus (HCC)   Anemia   Volume overload   Hyperkalemia   1-AKI on CKD stage IIIa lupus/lupus nephritis: Present with worsening renal function Patient underwent Tunneled HD catheter placement on 12-31. First HD treatment 12/31. On High dose IV steroids.  Might need kidney Bx, possible Monday. Possible Cytoxan depending on Bx results.  Next HD on Monday.   2-Hyperkalemia: On admission received bicarbonate, Lokelma, calcium gluconate. Repeat dose of lokelma today.   3-Normal anion gap metabolic acidosis ; Likely related to AKI. Continue with sodium bicarb tablet  4-Volume overload/anasarca: Secondary to acute renal failure: Received IV Laxis.   Patient will be initiated on hemodialysis monitor volume status.   5-Hypertensive urgency: Continue with amlodipine and labetalol.   IV lasix.    6-Lupus: Was  On  prednisone taper out patient.  She was a started on high-dose Solu-Medrol, day 3/3/  Will need to transition to oral prednsione Needs Follow-up with rheumatology  7-Thrombocytopenia: Likely secondary to AKI: Monitor  8-Community-acquired pneumonia: Abnormal chest x-ray showed infiltrates: Continue with IV antibiotics.  Procalcitonin elevated. Day 2/5/   9-Pericardial effusion: Recent echo 12/16 revealed moderate pericardial effusion. No evidence of Tamponade hypotension tachycardia  Epigastric pain: Started  PPI   Estimated body mass index is 34.18 kg/m as calculated from the following:   Height as of this encounter: 5\' 7"  (1.702 m).   Weight as of this encounter: 99 kg.   DVT prophylaxis: SCDs Code Status: Full code Family Communication: Care discussed with patient and mother who was on the phone Disposition Plan:  Status is: Inpatient  Remains inpatient appropriate because:IV treatments appropriate due to intensity of illness or inability to take PO   Dispo: The patient is from: Home              Anticipated d/c is to: Home              Anticipated d/c date is: 3 days              Patient currently is not medically stable to d/c.        Consultants:   Nephrology  IR  Procedures:   HD catheter   Antimicrobials:    Subjective: She is feeling better. She is more alert,. Better spirit.  Denies pain  Objective: Vitals:   12/01/20 0351 12/01/20 0406 12/01/20 0421 12/01/20 0557  BP: (!) 167/121 (!) 162/117 (!) 180/120 (!) 183/120  Pulse: (!) 59 64 62 66  Resp: (!) 0 (!) 0 (!) 6 15  Temp:   98.8 F (37.1 C) 98.7 F (37.1 C)  TempSrc:   Oral Axillary  SpO2: 100% 100% 100% 100%  Weight:      Height:        Intake/Output Summary (Last 24 hours) at 12/01/2020 0739 Last data filed at 12/01/2020 0421 Gross per 24 hour  Intake 0 ml  Output 1700 ml  Net -1700 ml   Filed Weights   11/30/20 0704  Weight: 99 kg    Examination:  General exam:  NAD Respiratory system: B/L crackles.  Cardiovascular system: S 1, S 2 RRR Gastrointestinal system: BS present, soft, nt Central nervous system: non focal.  Extremities: plus 2 edema   Data Reviewed: I have personally reviewed following labs and imaging studies  CBC: Recent Labs  Lab 11/29/20 1701 11/30/20 0319 12/01/20 0246  WBC 7.7 6.5 9.6  HGB 6.5* 8.4* 8.3*  HCT 20.9* 24.5* 25.3*  MCV 92.9 86.9 90.4  PLT 92* 96* 099*   Basic Metabolic Panel: Recent Labs  Lab 11/29/20 1701 11/29/20 2058 11/30/20 0319 11/30/20 1047 11/30/20 1506 11/30/20 1846 12/01/20 0247  NA 140  --  139 137  --   --  139  K 6.4*   < > 6.3* 6.1* 5.9* 5.8* 5.7*  CL 111  --  109 105  --   --  106  CO2 18*  --  19* 17*  --   --  19*  GLUCOSE 118*  --  111* 125*  --   --  122*  BUN 119*  --  117* 114*  --   --  123*  CREATININE 6.59*  --  6.26* 6.53*  --   --  6.74*  CALCIUM 7.3*  --  7.2* 7.1*  --   --  6.8*  PHOS  --   --  8.6*  --   --   --  9.2*   < > = values in this interval not displayed.   GFR: Estimated Creatinine Clearance: 13.8 mL/min (A) (by C-G formula based on SCr of 6.74 mg/dL (H)). Liver Function Tests: Recent Labs  Lab 11/30/20 0319 12/01/20 0247  ALBUMIN 2.2* 1.9*   No results for input(s): LIPASE, AMYLASE in the last 168 hours. No results for input(s): AMMONIA in the last 168 hours. Coagulation Profile: No results for input(s): INR, PROTIME in the last 168 hours. Cardiac Enzymes: No results for input(s): CKTOTAL, CKMB, CKMBINDEX, TROPONINI in the last 168 hours. BNP (last 3 results) No results for input(s): PROBNP in the last 8760 hours. HbA1C: No results for input(s): HGBA1C in the last 72 hours. CBG: Recent Labs  Lab 11/29/20 1943  GLUCAP 83   Lipid Profile: No results for input(s): CHOL, HDL, LDLCALC, TRIG, CHOLHDL, LDLDIRECT in the last 72 hours. Thyroid Function Tests: No results for input(s): TSH, T4TOTAL, FREET4, T3FREE, THYROIDAB in the last 72  hours. Anemia Panel: No results for input(s): VITAMINB12, FOLATE, FERRITIN, TIBC, IRON, RETICCTPCT in the last 72 hours. Sepsis Labs: Recent Labs  Lab 11/29/20 2058  PROCALCITON 0.64    Recent Results (from the past 240 hour(s))  Resp Panel by RT-PCR (Flu A&B, Covid) Nasopharyngeal Swab     Status: None   Collection Time: 11/29/20  7:24 PM   Specimen: Nasopharyngeal Swab; Nasopharyngeal(NP) swabs in vial transport medium  Result Value Ref Range Status   SARS Coronavirus 2 by RT PCR NEGATIVE NEGATIVE Final    Comment: (NOTE) SARS-CoV-2 target  nucleic acids are NOT DETECTED.  The SARS-CoV-2 RNA is generally detectable in upper respiratory specimens during the acute phase of infection. The lowest concentration of SARS-CoV-2 viral copies this assay can detect is 138 copies/mL. A negative result does not preclude SARS-Cov-2 infection and should not be used as the sole basis for treatment or other patient management decisions. A negative result may occur with  improper specimen collection/handling, submission of specimen other than nasopharyngeal swab, presence of viral mutation(s) within the areas targeted by this assay, and inadequate number of viral copies(<138 copies/mL). A negative result must be combined with clinical observations, patient history, and epidemiological information. The expected result is Negative.  Fact Sheet for Patients:  EntrepreneurPulse.com.au  Fact Sheet for Healthcare Providers:  IncredibleEmployment.be  This test is no t yet approved or cleared by the [place] FDA and  has been authorized for detection and/or diagnosis of SARS-CoV-2 by FDA under an Emergency Use Authorization (EUA). This EUA will remain  in effect (meaning this test can be used) for the duration of the COVID-19 declaration under Section 564(b)(1) of the Act, 21 U.S.C.section 360bbb-3(b)(1), unless the authorization is terminated  or revoked  sooner.       Influenza A by PCR NEGATIVE NEGATIVE Final   Influenza B by PCR NEGATIVE NEGATIVE Final    Comment: (NOTE) The Xpert Xpress SARS-CoV-2/FLU/RSV plus assay is intended as an aid in the diagnosis of influenza from Nasopharyngeal swab specimens and should not be used as a sole basis for treatment. Nasal washings and aspirates are unacceptable for Xpert Xpress SARS-CoV-2/FLU/RSV testing.  Fact Sheet for Patients: EntrepreneurPulse.com.au  Fact Sheet for Healthcare Providers: IncredibleEmployment.be  This test is not yet approved or cleared by the [place] FDA and has been authorized for detection and/or diagnosis of SARS-CoV-2 by FDA under an Emergency Use Authorization (EUA). This EUA will remain in effect (meaning this test can be used) for the duration of the COVID-19 declaration under Section 564(b)(1) of the Act, 21 U.S.C. section 360bbb-3(b)(1), unless the authorization is terminated or revoked.  Performed at [facility], [place] [address] [place], [place] [address]          Radiology Studies: US RENAL  Result Date: 11/30/2020 CLINICAL DATA:  Acute kidney injury EXAM: RENAL / URINARY TRACT ULTRASOUND COMPLETE COMPARISON:  None. FINDINGS: Right Kidney: Renal measurements: 11.8 x 7.2 x 5.7 cm = volume: 256 mL. Echogenicity within normal limits. Mild right pelviectasis is seen. Left Kidney: Renal measurements: 10.1 x 5.9 x 4.3 cm = volume: 161 mL. Echogenicity within normal limits. No mass or hydronephrosis visualized. Bladder: Appears normal for degree of bladder distention. Other: Abdominopelvic ascites is noted. Bilateral pleural effusions are seen. IMPRESSION: Nonspecific mild right pelviectasis. Abdominopelvic ascites Small bilateral pleural effusions. Electronically Signed   By: Prudencio Pair M.D.   On: 11/30/2020 01:20   DG Chest Portable 1 View  Result Date: 11/29/2020 CLINICAL DATA:  Volume overload. Anemia.  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 EXAM: PORTABLE CHEST 1 VIEW COMPARISON:  11/07/2020 FINDINGS: Cardiac enlargement. Bilateral pleural effusions, greater on the left. Infiltration or atelectasis in the left base. Mild perihilar infiltration, possibly edema. No pneumothorax. IMPRESSION: Cardiac enlargement with bilateral pleural effusions, greater on the left. Infiltration or atelectasis in the left base. Progression since previous study. Electronically Signed   By: Lucienne Capers M.D.   On: 11/29/2020 18:38        Scheduled Meds: . amLODipine  10 mg Oral Daily  . Chlorhexidine  Gluconate Cloth  6 each Topical Q0600  . heparin sodium (porcine)      . labetalol  400 mg Oral BID  . pantoprazole (PROTONIX) IV  40 mg Intravenous Q12H   Continuous Infusions: . sodium chloride    . azithromycin 500 mg (11/30/20 2236)  . cefTRIAXone (ROCEPHIN)  IV 1 g (11/30/20 2148)  . furosemide    . methylPREDNISolone (SOLU-MEDROL) injection 1,000 mg (12/01/20 0613)     LOS: 2 days    Time spent: 35 minutes.     Elmarie Shiley, MD [facility]   If 7PM-7AM, please contact night-coverage [email or web address]  12/01/2020, 7:39 AM

## 2020-12-01 NOTE — Plan of Care (Signed)
  Problem: Education: Goal: Knowledge of disease and its progression will improve Outcome: Progressing   

## 2020-12-01 NOTE — Progress Notes (Signed)
Nephrology Follow-Up Consult note   Assessment/Recommendations: Cathy [name] is a/an 40 y.o. female with a past medical history significant for SLE, HTN, CKD, admitted for volume overload, hyperkalemia, renal failure, presumptive lupus nephritis.     Non-oliguric AKI, severe: Likely secondary to lupus nephritis but other factors contributing. 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  -Avoid nephrotoxic medications including NSAIDs and Vanc/Zosyn combo -quant gold and hepatitis serologies pending  Hyperkalemia: 2/2 AKI. Multiple medications given. Dialysis this morning. Repeat K later today. Lokelma if needed  Volume overload: likely 2/2 ARF and nephrotic syndrome -Dialysis as above -Lasix 120mg  IV BID for now  SLE: known lupus but no kidney biopsy due to compliance issues in the past. Labs on 11/07/20 ANA+, Anti-dsDNA 31, +anti-centromere, chromatin, smith, ro, la. Plan for kidney biopsy, steroids, and further treatment based on results.  Hypertension: likely will improve w/ volume optimization. Steroids contributing -continue norvasc 10mg  daily, labetalol 400mg  BID -Lasix IV BID as above -consider hydral or clonidine if needed  Acidosis: 2/2 aki. Improved. Stop bicarb  PNA?: procalcitonin elevated. abx per primary  Anemia due to CKD and chronic inflammation: -Transfuse for Hgb<7 g/dL; ctm    Recommendations conveyed to primary service.    [facility] 12/01/2020 9:11 AM  ___________________________________________________________  CC: AKI,  hyperkalemia  Interval History/Subjective: Patient with general aches and pains today. Also some nausea. Tolerated dialysis early this morning without significant issues.   Medications:  Current Facility-Administered Medications  Medication Dose Route Frequency Provider Last Rate Last Admin  . 0.9 %  sodium chloride infusion  10 mL/hr Intravenous Once Shela Leff, MD      . acetaminophen (TYLENOL) tablet 650 mg  650 mg Oral Q6H PRN Shela Leff, MD       Or  . acetaminophen (TYLENOL) suppository 650 mg  650 mg Rectal Q6H PRN Shela Leff, MD      . albuterol (VENTOLIN HFA) 108 (90 Base) MCG/ACT inhaler 1-2 puff  1-2 puff Inhalation Q4H PRN Shela Leff, MD      . amLODipine (NORVASC) tablet 10 mg  10 mg Oral Daily Shela Leff, MD   10 mg at 12/01/20 7425  . azithromycin (ZITHROMAX) 500 mg in sodium chloride 0.9 % 250 mL IVPB  500 mg Intravenous QHS Shela Leff, MD 250 mL/hr at 11/30/20 2236 500 mg at 11/30/20 2236  . cefTRIAXone (ROCEPHIN) 1 g in sodium chloride 0.9 % 100 mL IVPB  1 g Intravenous QHS Shela Leff, MD 200 mL/hr at 11/30/20 2148 1 g at 11/30/20 2148  . Chlorhexidine Gluconate Cloth 2 % PADS 6 each  6 each Topical Q0600 Claudia Desanctis, MD   6 each at 12/01/20 [phone]  . fentaNYL (SUBLIMAZE) injection 25-50 mcg  25-50 mcg Intravenous Q2H PRN Gilles Chiquito B, MD   50 mcg at 12/01/20 0055  . furosemide (LASIX) 120 mg in dextrose 5 % 50 mL IVPB  120 mg Intravenous BID Reesa Chew, MD      . heparin sodium (porcine) 1000 UNIT/ML injection           . labetalol (  NORMODYNE) tablet 400 mg  400 mg Oral BID Shela Leff, MD   400 mg at 12/01/20 0607  . methylPREDNISolone sodium succinate (SOLU-MEDROL) 1,000 mg in sodium chloride 0.9 % 50 mL IVPB  1,000 mg Intravenous Q24H Claudia Desanctis, MD 58 mL/hr at 12/01/20 0613 1,000 mg at 12/01/20 7867  . pantoprazole (PROTONIX) injection 40 mg  40 mg Intravenous Q12H Regalado, Belkys A, MD   40 mg  at 11/30/20 2132  . polyethylene glycol (MIRALAX / GLYCOLAX) packet 17 g  17 g Oral Daily PRN Shela Leff, MD      . sodium zirconium cyclosilicate (LOKELMA) packet 10 g  10 g Oral Once Regalado, Belkys A, MD          Review of Systems: 10 systems reviewed and negative except per interval history/subjective  Physical Exam: Vitals:   12/01/20 0421 12/01/20 0557  BP: (!) 180/120 (!) 183/120  Pulse: 62 66  Resp: (!) 6 15  Temp: 98.8 F (37.1 C) 98.7 F (37.1 C)  SpO2: 100% 100%   No intake/output data recorded.  Intake/Output Summary (Last 24 hours) at 12/01/2020 0911 Last data filed at 12/01/2020 0421 Gross per 24 hour  Intake 0 ml  Output 1500 ml  Net -1500 ml   Constitutional: well-appearing, no acute distress ENMT: ears and nose without scars or lesions, MMM CV: normal rate, 2+ pitting edema in BLE Respiratory:bilateral chest rise, normal work of breathing Gastrointestinal: soft, non-tender, no palpable masses or hernias Skin: no visible lesions or rashes Psych: alert, judgement/insight appropriate, appropriate mood and affect   Test Results I personally reviewed new and old clinical labs and radiology tests Lab Results  Component Value Date   NA 139 12/01/2020   K 5.7 (H) 12/01/2020   CL 106 12/01/2020   CO2 19 (L) 12/01/2020   BUN 123 (H) 12/01/2020   CREATININE 6.74 (H) 12/01/2020   CALCIUM 6.8 (L) 12/01/2020   ALBUMIN 1.9 (L) 12/01/2020   PHOS 9.2 (H) 12/01/2020

## 2020-12-02 DIAGNOSIS — N179 Acute kidney failure, unspecified: Secondary | ICD-10-CM | POA: Diagnosis not present

## 2020-12-02 LAB — CBC
HCT: 21.2 % — ABNORMAL LOW (ref 36.0–46.0)
Hemoglobin: 7.4 g/dL — ABNORMAL LOW (ref 12.0–15.0)
MCH: 30 pg (ref 26.0–34.0)
MCHC: 34.9 g/dL (ref 30.0–36.0)
MCV: 85.8 fL (ref 80.0–100.0)
Platelets: 123 10*3/uL — ABNORMAL LOW (ref 150–400)
RBC: 2.47 MIL/uL — ABNORMAL LOW (ref 3.87–5.11)
RDW: 16.1 % — ABNORMAL HIGH (ref 11.5–15.5)
WBC: 6.8 10*3/uL (ref 4.0–10.5)
nRBC: 0 % (ref 0.0–0.2)

## 2020-12-02 LAB — RENAL FUNCTION PANEL
Albumin: 2 g/dL — ABNORMAL LOW (ref 3.5–5.0)
Anion gap: 12 (ref 5–15)
BUN: 101 mg/dL — ABNORMAL HIGH (ref 6–20)
CO2: 20 mmol/L — ABNORMAL LOW (ref 22–32)
Calcium: 6.5 mg/dL — ABNORMAL LOW (ref 8.9–10.3)
Chloride: 105 mmol/L (ref 98–111)
Creatinine, Ser: 6.19 mg/dL — ABNORMAL HIGH (ref 0.44–1.00)
GFR, Estimated: 8 mL/min — ABNORMAL LOW (ref >60)
Glucose, Bld: 140 mg/dL — ABNORMAL HIGH (ref 70–99)
Phosphorus: 8.6 mg/dL — ABNORMAL HIGH (ref 2.5–4.6)
Potassium: 5.2 mmol/L — ABNORMAL HIGH (ref 3.5–5.1)
Sodium: 137 mmol/L (ref 135–145)

## 2020-12-02 LAB — QUANTIFERON-TB GOLD PLUS (RQFGPL)
QuantiFERON Mitogen Value: 0.11 IU/mL
QuantiFERON Nil Value: 0 IU/mL
QuantiFERON TB1 Ag Value: 0 IU/mL
QuantiFERON TB2 Ag Value: 0 IU/mL

## 2020-12-02 LAB — QUANTIFERON-TB GOLD PLUS: QuantiFERON-TB Gold Plus: UNDETERMINED — AB

## 2020-12-02 MED ORDER — HYDROCORTISONE 1 % EX CREA
TOPICAL_CREAM | Freq: Two times a day (BID) | CUTANEOUS | Status: DC
  Filled 2020-12-02: qty 28

## 2020-12-02 MED ORDER — PANTOPRAZOLE SODIUM 40 MG PO TBEC
40.0000 mg | DELAYED_RELEASE_TABLET | Freq: Every day | ORAL | Status: DC
  Administered 2020-12-03 – 2020-12-14 (×12): 40 mg via ORAL
  Filled 2020-12-02 (×13): qty 1

## 2020-12-02 MED ORDER — HYDROCODONE-ACETAMINOPHEN 5-325 MG PO TABS
1.0000 | ORAL_TABLET | Freq: Four times a day (QID) | ORAL | Status: DC | PRN
  Administered 2020-12-02 – 2020-12-13 (×13): 1 via ORAL
  Filled 2020-12-02 (×14): qty 1

## 2020-12-02 MED ORDER — VITAMIN D 25 MCG (1000 UNIT) PO TABS
1000.0000 [IU] | ORAL_TABLET | Freq: Every day | ORAL | Status: DC
  Administered 2020-12-02 – 2020-12-14 (×13): 1000 [IU] via ORAL
  Filled 2020-12-02 (×13): qty 1

## 2020-12-02 MED ORDER — POLYETHYLENE GLYCOL 3350 17 G PO PACK
17.0000 g | PACK | Freq: Two times a day (BID) | ORAL | Status: DC
  Administered 2020-12-02 – 2020-12-12 (×5): 17 g via ORAL
  Filled 2020-12-02 (×18): qty 1

## 2020-12-02 MED ORDER — PREDNISONE 20 MG PO TABS
40.0000 mg | ORAL_TABLET | Freq: Every day | ORAL | Status: DC
  Administered 2020-12-03 – 2020-12-14 (×11): 40 mg via ORAL
  Filled 2020-12-02 (×11): qty 2

## 2020-12-02 MED ORDER — SODIUM ZIRCONIUM CYCLOSILICATE 10 G PO PACK
10.0000 g | PACK | Freq: Once | ORAL | Status: AC
  Administered 2020-12-02: 10 g via ORAL
  Filled 2020-12-02: qty 1

## 2020-12-02 NOTE — Consult Note (Signed)
Chief Complaint: Patient was seen in consultation today for AKI/random renal biopsy.  Referring Physician(s): Reesa Chew (nephrology)  Supervising Physician: Jacqulynn Cadet  Patient Status: [facility] - In-pt  History of Present Illness: Cathy [name] is a 40 y.o. female with a past medical history of hypertension, asthma, SLE with associated lupus nephritis, PID, bipolar disorder, anxiety, and depression. Of note, patient was recently admitted to [facility] 11/07/2020 to 11/13/2020 for management of AKI on CKD/lupus nephritis. She improved with IV diuretics. She presented to [facility] ED 11/29/2020 with complaint of dyspnea. In ED, patient found to be in fluid overload (anascarca, pericardial effusion) along with hyperkalemia. She was admitted for further management and started on IV lasix, calcium gluconate, IV sodium bicarbonate, insulin/dextrose, and lokelma. Nephrology was consulted who recommended initiation of HD- she had a tunneled HD catheter placed in IR 11/30/2020. Given history of SLE, nephology recommending IR renal biopsy to R/O lupus nephritis/plan further treatment.  IR consulted by Dr. Joylene Grapes for possible image-guided random renal biopsy. Patient awake and alert sitting in bed eating breakfast with no complaints at this time. Denies fever, chills, chest pain, dyspnea, abdominal pain, or headache.   Past Medical History:  Diagnosis Date  . Anxiety   . Asthma   . Bipolar depression ([place])   . Depression   . Gonorrhea   . Lupus ([place])   . Pelvic inflammatory disease (PID)   . Renal hypertension   . Trichomonas infection     Past Surgical History:  Procedure Laterality Date  . IR FLUORO GUIDE CV LINE RIGHT  11/30/2020  . IR US GUIDE VASC ACCESS RIGHT  11/30/2020  . RENAL BIOPSY    . TUBAL LIGATION N/A 02/03/2019   Procedure: POST PARTUM TUBAL LIGATION;  Surgeon: Aletha Halim, MD;  Location: [facility] LD ORS;  Service: Gynecology;  Laterality: N/A;    Allergies: Patient  has no known allergies.  Medications: Prior to Admission medications   Medication Sig Start Date End Date Taking? Authorizing Provider  albuterol (PROAIR HFA) 108 (90 Base) MCG/ACT inhaler Inhale 2 puffs into the lungs every 2 (two) hours as needed for wheezing or shortness of breath.   Yes [provider]  amLODipine (NORVASC) 10 MG tablet Take 1 tablet (10 mg total) by mouth daily. 11/13/20  Yes Shelly Coss, MD  famotidine (PEPCID) 10 MG tablet Take 1 tablet (10 mg total) by mouth daily. Patient taking differently: Take 10 mg by mouth at bedtime. 11/14/20  Yes Shelly Coss, MD  furosemide (LASIX) 40 MG tablet Take 1 tablet (40 mg total) by mouth 3 (three) times a week. Take on Monday,wednesday and friday Patient taking differently: Take 40 mg by mouth in the morning. 11/14/20 01/23/21 Yes Shelly Coss, MD  labetalol (NORMODYNE) 200 MG tablet Take 2 tablets (400 mg total) by mouth 2 (two) times daily. 11/13/20  Yes Shelly Coss, MD  predniSONE (DELTASONE) 10 MG tablet Take 1 tablet (10 mg total) by mouth daily with breakfast. Take 5 pills daily for 7 days then 4 pills daily for 7 days then 3 pills daily for 7 days then 2 pills daily for 7 days then 1 pill daily for 7 days then stop Patient taking differently: Take 10-50 mg by mouth See admin instructions. Take 50 mg by mouth once daily for 7 days, 40 mg once daily for 7 days, 30 mg once daily for 7 days, 20 mg once daily for 7 days, 10 mg once daily for 7 days, then stop 11/13/20  Yes Shelly Coss, MD  sodium bicarbonate 650 MG tablet Take 2 tablets (1,300 mg total) by mouth 2 (two) times daily. 11/13/20 12/13/20 Yes Shelly Coss, MD  fenofibrate 160 MG tablet Take 1 tablet (160 mg total) by mouth daily. 11/14/20   Shelly Coss, MD  hydrocortisone cream 1 % Apply topically 2 (two) times daily. 11/13/20   Shelly Coss, MD  polyethylene glycol (MIRALAX / GLYCOLAX) 17 g packet Take 17 g by mouth daily as needed for mild  constipation. Patient taking differently: Take 17 g by mouth daily as needed for mild constipation (MIX AND DRINK). 11/13/20   Shelly Coss, MD     Family History  Problem Relation Age of Onset  . Diabetes Maternal Grandmother   . Hypertension Maternal Grandmother   . Diabetes Maternal Grandfather   . Hypertension Maternal Grandfather   . Diabetes Paternal Grandmother   . Hypertension Paternal Grandmother   . Diabetes Paternal Grandfather   . Hypertension Paternal Grandfather     Social History   Socioeconomic History  . Marital status: Single    Spouse name: Not on file  . Number of children: Not on file  . Years of education: Not on file  . Highest education level: Not on file  Occupational History  . Not on file  Tobacco Use  . Smoking status: Former Smoker    Packs/day: 0.25    Types: Cigarettes    Quit date: 10/08/2020    Years since quitting: 0.1  . Smokeless tobacco: Never Used  Vaping Use  . Vaping Use: Never used  Substance and Sexual Activity  . Alcohol use: Not Currently  . Drug use: Not Currently    Types: Marijuana    Comment: 3 times a week   . Sexual activity: Not Currently    Birth control/protection: None  Other Topics Concern  . Not on file  Social History Narrative  . Not on file   Social Determinants of Health   Financial Resource Strain: Not on file  Food Insecurity: Not on file  Transportation Needs: Not on file  Physical Activity: Not on file  Stress: Not on file  Social Connections: Not on file     Review of Systems: A 12 point ROS discussed and pertinent positives are indicated in the HPI above.  All other systems are negative.  Review of Systems  Constitutional: Negative for chills and fever.  Respiratory: Negative for shortness of breath and wheezing.   Cardiovascular: Negative for chest pain and palpitations.  Gastrointestinal: Negative for abdominal pain.  Neurological: Negative for headaches.  Psychiatric/Behavioral:  Negative for behavioral problems and confusion.    Vital Signs: BP (!) 157/114 (BP Location: Right Arm)   Pulse 68   Temp 98.6 F (37 C) (Oral)   Resp 18   Ht 5' 7"  (1.702 m)   Wt 218 lb 4.1 oz (99 kg)   SpO2 100%   BMI 34.18 kg/m   Physical Exam Vitals and nursing note reviewed.  Constitutional:      General: She is not in acute distress.    Appearance: Normal appearance.  Cardiovascular:     Rate and Rhythm: Normal rate and regular rhythm.     Heart sounds: Normal heart sounds. No murmur heard.   Pulmonary:     Effort: Pulmonary effort is normal. No respiratory distress.     Breath sounds: Normal breath sounds. No wheezing.  Skin:    General: Skin is warm and dry.  Neurological:  Mental Status: She is alert and oriented to person, place, and time.      MD Evaluation Airway: WNL Heart: WNL Abdomen: WNL Chest/ Lungs: WNL ASA  Classification: 3 Mallampati/Airway Score: One   Imaging: CT ABDOMEN PELVIS WO CONTRAST  Addendum Date: 11/07/2020   ADDENDUM REPORT: 11/07/2020 15:12 ADDENDUM: These results were called by telephone at the time of interpretation on 11/07/2020 at 3:12 pm to provider Lacretia Leigh , who verbally acknowledged these results. Electronically Signed   By: Zetta Bills M.D.   On: 11/07/2020 15:12   Result Date: 11/07/2020 CLINICAL DATA:  Abdominal distension, throat burning and cough EXAM: CT ABDOMEN AND PELVIS WITHOUT CONTRAST TECHNIQUE: Multidetector CT imaging of the abdomen and pelvis was performed following the standard protocol without IV contrast. COMPARISON:  CT angiography of the chest of February 01, 2019 FINDINGS: Lower chest: Cardiomegaly. Heart is incompletely imaged. Volume loss in the LEFT lung base. Moderate pericardial effusion with low-attenuation of cardiac blood pool compatible with anemia. Hepatobiliary: Liver with normal contour and size. No gallbladder distension. Pancreas: Normal contour of the pancreas. Generalized stranding  throughout the abdomen not concentrated in the area of the pancreas in seen in the setting of anasarca is of uncertain significance. Spleen: Spleen normal size.  Normal contour of the spleen. Adrenals/Urinary Tract: Adrenal glands normal. Bilateral perinephric stranding. No signs of hydronephrosis. No radiopaque calculi. Urinary bladder under distended limiting assessment. Stomach/Bowel: Fold thickening, a very pronounced fold thickening in the stomach. Generalized edema in the small bowel mesentery. Haustral thickening throughout the entire colon. Bowel edema in the small bowel without focal abnormality. No sign of bowel obstruction. Dilute contrast extends into the colon, through the small bowel. Appendix is normal Vascular/Lymphatic: No aneurysmal dilation of the abdominal aorta. Smooth contour of the IVC. 17 mm LEFT para-aortic lymph node. No additional adenopathy visualized in the upper abdomen though there is extensive edema which limits assessment. Anasarca blurs boundaries between fat, solid and hollow viscera in the abdomen and musculoskeletal structures in the body wall. Enlarged lymph nodes about the bilateral inguinal regions and in the pelvis (image 68, series 2) 1.4 cm LEFT external iliac lymph node and generalized nodal enlargement with similar size in the bilateral external iliac chains and along is the RIGHT and LEFT groin. Reproductive: Bilateral tubal ligation clips. No adnexal masses. Limited assessment on noncontrast imaging. Other: Small volume ascites in the pelvis.  No free air. Musculoskeletal: No acute bone finding. No destructive bone process. IMPRESSION: 1. Anasarca with signs of anemia. 2. Marked gastric fold thickening may be due to hyperproteinemia or severe anasarca. Correlate with symptoms of gastritis and consider endoscopic assessment for further evaluation. 3. Haustral thickening of the colon also may be related to anasarca, correlate with any symptoms of colitis. Would also  consider correlation with lactate though again findings are diffuse, generalized in seen in the setting of anasarca. 4. Generalized nodal enlargement seen mainly in the pelvis findings are nonspecific, raising the question of lymphoproliferative disorder and not well evaluated given lack of contrast and diffuse marked anasarca. Would also correlate with processes such as HIV infection which could cause generalized nodal enlargement. 5. Bilateral perinephric stranding. Seen in the setting of anasarca without signs of hydronephrosis. 6. Lingular consolidative changes partially visualized may simply be due to volume loss though are not well assessed. Consider follow-up to ensure resolution. Correlate with any respiratory symptoms. 7. Aortic atherosclerosis. Aortic Atherosclerosis (ICD10-I70.0). Electronically Signed: By: Zetta Bills M.D. On: 11/07/2020 15:04  US RENAL  Result Date: 11/30/2020 CLINICAL DATA:  Acute kidney injury EXAM: RENAL / URINARY TRACT ULTRASOUND COMPLETE COMPARISON:  None. FINDINGS: Right Kidney: Renal measurements: 11.8 x 7.2 x 5.7 cm = volume: 256 mL. Echogenicity within normal limits. Mild right pelviectasis is seen. Left Kidney: Renal measurements: 10.1 x 5.9 x 4.3 cm = volume: 161 mL. Echogenicity within normal limits. No mass or hydronephrosis visualized. Bladder: Appears normal for degree of bladder distention. Other: Abdominopelvic ascites is noted. Bilateral pleural effusions are seen. IMPRESSION: Nonspecific mild right pelviectasis. Abdominopelvic ascites Small bilateral pleural effusions. Electronically Signed   By: Prudencio Pair M.D.   On: 11/30/2020 01:20   IR Fluoro Guide CV Line Right  Result Date: 12/01/2020 INDICATION: 40 year old female with lupus nephritis in need of hemodialysis. EXAM: TUNNELED CENTRAL VENOUS HEMODIALYSIS CATHETER PLACEMENT WITH ULTRASOUND AND FLUOROSCOPIC GUIDANCE MEDICATIONS: None. ANESTHESIA/SEDATION: Moderate (conscious) sedation was employed  during this procedure. A total of Versed 2 mg and Fentanyl 100 mcg was administered intravenously. Moderate Sedation Time: 27 minutes. The patient's level of consciousness and vital signs were monitored continuously by radiology nursing throughout the procedure under my direct supervision. FLUOROSCOPY TIME:  Fluoroscopy Time: 1 minutes 48 seconds (7 mGy). COMPLICATIONS: None immediate. PROCEDURE: Informed written consent was obtained from the patient after a discussion of the risks, benefits, and alternatives to treatment. Questions regarding the procedure were encouraged and answered. The right neck and chest were prepped with chlorhexidine in a sterile fashion, and a sterile drape was applied covering the operative field. Maximum barrier sterile technique with sterile gowns and gloves were used for the procedure. A timeout was performed prior to the initiation of the procedure. After creating a small venotomy incision, a micropuncture kit was utilized to access the right internal jugular vein under direct, real-time ultrasound guidance after the overlying soft tissues were anesthetized with 1% lidocaine with epinephrine. Ultrasound image documentation was performed. The microwire was kinked to measure appropriate catheter length. A stiff Glidewire was advanced to the level of the IVC and the micropuncture sheath was exchanged for a peel-away sheath. A tunneled hemodialysis catheter measuring 19 cm from tip to cuff was tunneled in a retrograde fashion from the anterior chest wall to the venotomy incision. The catheter was then placed through the peel-away sheath with tips ultimately positioned within the superior aspect of the right atrium. Final catheter positioning was confirmed and documented with a spot radiographic image. The catheter aspirates and flushes normally. The catheter was flushed with appropriate volume heparin dwells. The catheter exit site was secured with a 0-Prolene retention suture. The  venotomy incision was closed with an interrupted 4-0 Vicryl, Dermabond and Steri-strips. Dressings were applied. The patient tolerated the procedure well without immediate post procedural complication. IMPRESSION: Successful placement of 19 cm tip to cuff tunneled hemodialysis catheter via the right internal jugular vein with tips terminating within the superior aspect of the right atrium. The catheter is ready for immediate use. Electronically Signed   By: Jacqulynn Cadet M.D.   On: 12/01/2020 08:39   IR US Guide Vasc Access Right  Result Date: 12/01/2020 INDICATION: 40 year old female with lupus nephritis in need of hemodialysis. EXAM: TUNNELED CENTRAL VENOUS HEMODIALYSIS CATHETER PLACEMENT WITH ULTRASOUND AND FLUOROSCOPIC GUIDANCE MEDICATIONS: None. ANESTHESIA/SEDATION: Moderate (conscious) sedation was employed during this procedure. A total of Versed 2 mg and Fentanyl 100 mcg was administered intravenously. Moderate Sedation Time: 27 minutes. The patient's level of consciousness and vital signs were monitored continuously by radiology nursing throughout the procedure  under my direct supervision. FLUOROSCOPY TIME:  Fluoroscopy Time: 1 minutes 48 seconds (7 mGy). COMPLICATIONS: None immediate. PROCEDURE: Informed written consent was obtained from the patient after a discussion of the risks, benefits, and alternatives to treatment. Questions regarding the procedure were encouraged and answered. The right neck and chest were prepped with chlorhexidine in a sterile fashion, and a sterile drape was applied covering the operative field. Maximum barrier sterile technique with sterile gowns and gloves were used for the procedure. A timeout was performed prior to the initiation of the procedure. After creating a small venotomy incision, a micropuncture kit was utilized to access the right internal jugular vein under direct, real-time ultrasound guidance after the overlying soft tissues were anesthetized with 1%  lidocaine with epinephrine. Ultrasound image documentation was performed. The microwire was kinked to measure appropriate catheter length. A stiff Glidewire was advanced to the level of the IVC and the micropuncture sheath was exchanged for a peel-away sheath. A tunneled hemodialysis catheter measuring 19 cm from tip to cuff was tunneled in a retrograde fashion from the anterior chest wall to the venotomy incision. The catheter was then placed through the peel-away sheath with tips ultimately positioned within the superior aspect of the right atrium. Final catheter positioning was confirmed and documented with a spot radiographic image. The catheter aspirates and flushes normally. The catheter was flushed with appropriate volume heparin dwells. The catheter exit site was secured with a 0-Prolene retention suture. The venotomy incision was closed with an interrupted 4-0 Vicryl, Dermabond and Steri-strips. Dressings were applied. The patient tolerated the procedure well without immediate post procedural complication. IMPRESSION: Successful placement of 19 cm tip to cuff tunneled hemodialysis catheter via the right internal jugular vein with tips terminating within the superior aspect of the right atrium. The catheter is ready for immediate use. Electronically Signed   By: Jacqulynn Cadet M.D.   On: 12/01/2020 08:39   DG Chest Portable 1 View  Result Date: 11/29/2020 CLINICAL DATA:  Volume overload. Anemia. 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 EXAM: PORTABLE CHEST 1 VIEW COMPARISON:  11/07/2020 FINDINGS: Cardiac enlargement. Bilateral pleural effusions, greater on the left. Infiltration or atelectasis in the left base. Mild perihilar infiltration, possibly edema. No pneumothorax. IMPRESSION: Cardiac enlargement with bilateral pleural effusions, greater on the left. Infiltration or atelectasis in the left base. Progression since previous study. Electronically Signed   By: Lucienne Capers M.D.   On:  11/29/2020 18:38   DG Chest Port 1 View  Result Date: 11/07/2020 CLINICAL DATA:  Shortness of breath, cough EXAM: PORTABLE CHEST 1 VIEW COMPARISON:  02/01/2019 FINDINGS: Cardiomegaly. No confluent opacities or effusions. No edema. No acute bony abnormality. IMPRESSION: Cardiomegaly.  No active disease. Electronically Signed   By: Rolm Baptise M.D.   On: 11/07/2020 11:53   ECHOCARDIOGRAM COMPLETE  Result Date: 11/12/2020    ECHOCARDIOGRAM REPORT   Patient Name:   Cathy [name] Date of Exam: 11/12/2020 Medical Rec #:  [id number]        Height:       67.0 in Accession #:    [id number]       Weight:       159.4 lb Date of Birth:  Oct 10, 1981       BSA:          1.836 m Patient Age:    39 years         BP:           143/100 mmHg Patient Gender: F  HR:           67 bpm. Exam Location:  Inpatient Procedure: 2D Echo, Cardiac Doppler and Color Doppler Indications:    Pericardial effusion 423.9 / I31.3  History:        Patient has prior history of Echocardiogram examinations, most                 recent 11/10/2020.  Sonographer:    Bernadene Person RDCS Referring Phys: [place]  1. Left ventricular ejection fraction, by estimation, is 55 to 60%. The left ventricle has normal function. The left ventricle has no regional wall motion abnormalities. There is mild left ventricular hypertrophy. Left ventricular diastolic parameters are consistent with Grade II diastolic dysfunction (pseudonormalization).  2. Right ventricular systolic function is normal. The right ventricular size is normal. There is normal pulmonary artery systolic pressure. The estimated right ventricular systolic pressure is 16.1 mmHg.  3. The mitral valve is normal in structure. Mild mitral valve regurgitation. No evidence of mitral stenosis.  4. The aortic valve is tricuspid. Aortic valve regurgitation is trivial. No aortic stenosis is present.  5. The inferior vena cava is normal in size with greater than 50%  respiratory variability, suggesting right atrial pressure of 3 mmHg.  6. Moderate pericardial effusion. There is no RV diastolic collapse. There is < 25% respirophasic variation of the mitral E inflow velocity. The IVC is not dilated. No evidence for tamponade. FINDINGS  Left Ventricle: Left ventricular ejection fraction, by estimation, is 55 to 60%. The left ventricle has normal function. The left ventricle has no regional wall motion abnormalities. The left ventricular internal cavity size was normal in size. There is  mild left ventricular hypertrophy. Left ventricular diastolic parameters are consistent with Grade II diastolic dysfunction (pseudonormalization). Right Ventricle: The right ventricular size is normal. No increase in right ventricular wall thickness. Right ventricular systolic function is normal. There is normal pulmonary artery systolic pressure. The tricuspid regurgitant velocity is 2.56 m/s, and  with an assumed right atrial pressure of 3 mmHg, the estimated right ventricular systolic pressure is 09.6 mmHg. Left Atrium: Left atrial size was normal in size. Right Atrium: Right atrial size was normal in size. Pericardium: Moderate pericardial effusion. There is no RV diastolic collapse. There is < 25% respirophasic variation of the mitral E inflow velocity. The IVC is not dilated. No evidence for tamponade. A moderately sized pericardial effusion is present. Mitral Valve: The mitral valve is normal in structure. Mild mitral valve regurgitation. No evidence of mitral valve stenosis. Tricuspid Valve: The tricuspid valve is normal in structure. Tricuspid valve regurgitation is trivial. Aortic Valve: The aortic valve is tricuspid. Aortic valve regurgitation is trivial. Aortic regurgitation PHT measures 492 msec. No aortic stenosis is present. Pulmonic Valve: The pulmonic valve was normal in structure. Pulmonic valve regurgitation is not visualized. Aorta: The aortic root is normal in size and  structure. Venous: The inferior vena cava is normal in size with greater than 50% respiratory variability, suggesting right atrial pressure of 3 mmHg. IAS/Shunts: No atrial level shunt detected by color flow Doppler.  LEFT VENTRICLE PLAX 2D LVIDd:         5.10 cm  Diastology LVIDs:         3.20 cm  LV e' medial:    7.51 cm/s LV PW:         1.20 cm  LV E/e' medial:  15.3 LV IVS:        0.90 cm  LV e' lateral:   9.79 cm/s LVOT diam:     2.10 cm  LV E/e' lateral: 11.7 LV SV:         109 LV SV Index:   59 LVOT Area:     3.46 cm  RIGHT VENTRICLE RV S prime:     13.50 cm/s TAPSE (M-mode): 2.2 cm LEFT ATRIUM           Index       RIGHT ATRIUM           Index LA diam:      4.20 cm 2.29 cm/m  RA Area:     13.90 cm LA Vol (A2C): 44.0 ml 23.96 ml/m RA Volume:   29.40 ml  16.01 ml/m LA Vol (A4C): 38.9 ml 21.18 ml/m  AORTIC VALVE LVOT Vmax:   136.00 cm/s LVOT Vmean:  98.300 cm/s LVOT VTI:    0.314 m AI PHT:      492 msec  AORTA Ao Root diam: 2.80 cm Ao Asc diam:  3.00 cm MITRAL VALVE                 TRICUSPID VALVE MV Area (PHT): 2.50 cm      TR Peak grad:   26.2 mmHg MV Decel Time: 303 msec      TR Vmax:        256.00 cm/s MR Peak grad:    137.4 mmHg MR Mean grad:    96.0 mmHg   SHUNTS MR Vmax:         586.00 cm/s Systemic VTI:  0.31 m MR Vmean:        468.0 cm/s  Systemic Diam: 2.10 cm MR PISA:         1.01 cm MR PISA Eff ROA: 7 mm MR PISA Radius:  0.40 cm MV E velocity: 115.00 cm/s MV A velocity: 84.80 cm/s MV E/A ratio:  1.36 Cathy Champagne MD Electronically signed by Cathy Champagne MD Signature Date/Time: 11/12/2020/1:28:06 PM    Final    ECHOCARDIOGRAM COMPLETE  Result Date: 11/08/2020    ECHOCARDIOGRAM REPORT   Patient Name:   Cathy [name] Date of Exam: 11/08/2020 Medical Rec #:  [id number]        Height:       67.0 in Accession #:    [id number]       Weight:       143.0 lb Date of Birth:  September 02, 1981       BSA:          1.754 m Patient Age:    39 years         BP:           163/109 mmHg Patient Gender: F                 HR:           75 bpm. Exam Location:  Inpatient Procedure: 2D Echo, 3D Echo, Cardiac Doppler and Color Doppler REPORT CONTAINS CRITICAL RESULT Indications:    Anasarca  History:        Patient has prior history of Echocardiogram examinations, most                 recent 02/02/2019. Risk Factors:Hypertension. Substance abuse.                 Pulmonary edema. Renal failure. Lupus.  Sonographer:    Roseanna Rainbow RDCS Referring Phys: [id number] [place]  1. Left ventricular ejection  fraction, by estimation, is 60 to 65%. The left ventricle has normal function. The left ventricle has no regional wall motion abnormalities. There is severe left ventricular hypertrophy.  2. Right ventricular systolic function is normal. The right ventricular size is normal. Mildly increased right ventricular wall thickness. There is normal pulmonary artery systolic pressure.  3. Large pericardial effusion surrounds heart Measures 17 mm in maximal dimension. Mitral inflow is without significant respiratory variation; there is mild RV indentation but no RV collapse But IVC is dilated Does not meet echo criteria for tamponade CLinical correlation indicated. . Large pericardial effusion. The pericardial effusion is circumferential.  4. MR is eccentric, directed more posterior into LA. The mitral valve is abnormal. Mild mitral valve regurgitation.  5. The aortic valve is tricuspid. Aortic valve regurgitation is trivial. Mild aortic valve sclerosis is present, with no evidence of aortic valve stenosis. FINDINGS  Left Ventricle: Left ventricular ejection fraction, by estimation, is 60 to 65%. The left ventricle has normal function. The left ventricle has no regional wall motion abnormalities. The left ventricular internal cavity size was normal in size. There is  severe left ventricular hypertrophy. Right Ventricle: The right ventricular size is normal. Mildly increased right ventricular wall thickness. Right ventricular  systolic function is normal. There is normal pulmonary artery systolic pressure. The tricuspid regurgitant velocity is 2.39 m/s, and with an assumed right atrial pressure of 3 mmHg, the estimated right ventricular systolic pressure is 55.7 mmHg. Left Atrium: Left atrial size was normal in size. Right Atrium: Right atrial size was normal in size. Pericardium: Large pericardial effusion surrounds heart Measures 17 mm in maximal dimension. Mitral inflow is without significant respiratory variation; there is mild RV indentation but no RV collapse But IVC is dilated Does not meet echo criteria for tamponade CLinical correlation indicated. A large pericardial effusion is present. The pericardial effusion is circumferential. Mitral Valve: MR is eccentric, directed more posterior into LA. The mitral valve is abnormal. There is mild thickening of the mitral valve leaflet(s). Mild mitral valve regurgitation. Tricuspid Valve: The tricuspid valve is normal in structure. Tricuspid valve regurgitation is not demonstrated. Aortic Valve: The aortic valve is tricuspid. Aortic valve regurgitation is trivial. Mild aortic valve sclerosis is present, with no evidence of aortic valve stenosis. Aortic valve mean gradient measures 7.0 mmHg. Aortic valve peak gradient measures 14.9 mmHg. Aortic valve area, by VTI measures 3.44 cm. Pulmonic Valve: The pulmonic valve was grossly normal. Pulmonic valve regurgitation is trivial. Aorta: The aortic root is normal in size and structure. IAS/Shunts: The interatrial septum was not assessed. Additional Comments: Mild ascites is present.  LEFT VENTRICLE PLAX 2D LVIDd:         4.20 cm      Diastology LVIDs:         2.80 cm      LV e' medial:    13.20 cm/s LV PW:         2.10 cm      LV E/e' medial:  9.9 LV IVS:        1.50 cm      LV e' lateral:   12.10 cm/s LVOT diam:     2.10 cm      LV E/e' lateral: 10.8 LV SV:         125 LV SV Index:   71 LVOT Area:     3.46 cm  LV Volumes (MOD) LV vol d, MOD  A2C: 105.0 ml LV vol d, MOD A4C: 118.0 ml  LV vol s, MOD A2C: 32.1 ml LV vol s, MOD A4C: 43.8 ml LV SV MOD A2C:     72.9 ml LV SV MOD A4C:     118.0 ml LV SV MOD BP:      73.2 ml RIGHT VENTRICLE             IVC RV S prime:     17.50 cm/s  IVC diam: 2.70 cm TAPSE (M-mode): 3.7 cm LEFT ATRIUM             Index       RIGHT ATRIUM           Index LA diam:        4.00 cm 2.28 cm/m  RA Area:     21.00 cm LA Vol (A2C):   49.3 ml 28.11 ml/m RA Volume:   63.60 ml  36.27 ml/m LA Vol (A4C):   56.0 ml 31.94 ml/m LA Biplane Vol: 52.5 ml 29.94 ml/m  AORTIC VALVE                    PULMONIC VALVE AV Area (Vmax):    3.09 cm     PR End Diast Vel: 2.28 msec AV Area (Vmean):   3.18 cm AV Area (VTI):     3.44 cm AV Vmax:           193.00 cm/s AV Vmean:          121.000 cm/s AV VTI:            0.362 m AV Peak Grad:      14.9 mmHg AV Mean Grad:      7.0 mmHg LVOT Vmax:         172.00 cm/s LVOT Vmean:        111.000 cm/s LVOT VTI:          0.360 m LVOT/AV VTI ratio: 0.99  AORTA Ao Root diam: 3.20 cm MITRAL VALVE                 TRICUSPID VALVE MV Area (PHT): 3.37 cm      TR Peak grad:   22.8 mmHg MV Decel Time: 225 msec      TR Vmax:        239.00 cm/s MR Peak grad:    113.6 mmHg MR Mean grad:    67.0 mmHg   SHUNTS MR Vmax:         533.00 cm/s Systemic VTI:  0.36 m MR Vmean:        381.0 cm/s  Systemic Diam: 2.10 cm MR PISA:         2.26 cm MR PISA Eff ROA: 16 mm MR PISA Radius:  0.60 cm MV E velocity: 131.00 cm/s MV A velocity: 103.00 cm/s MV E/A ratio:  1.27 Cathy Carnes MD Electronically signed by Cathy Carnes MD Signature Date/Time: 11/08/2020/7:14:42 PM    Final    ECHOCARDIOGRAM LIMITED  Result Date: 11/15/2020    ECHOCARDIOGRAM LIMITED REPORT   Patient Name:   Cathy [name] Date of Exam: 11/15/2020 Medical Rec #:  [id number]        Height:       67.0 in Accession #:    [id number]       Weight:       167.5 lb Date of Birth:  12-Jun-1981       BSA:          1.876 m Patient Age:    6  years         BP:           138/90  mmHg Patient Gender: F                HR:           93 bpm. Exam Location:  [address] Procedure: 3D Echo, Limited Echo, Cardiac Doppler and Limited Color Doppler Indications:    I31.3 Pericardial Effusion  History:        Patient has prior history of Echocardiogram examinations, most                 recent 11/12/2020. Risk Factors:Former Smoker. Substance Abuse,                 Anasarca, Lupus, Moderate Pericardial Effusion (11-12-20).  Sonographer:    Deliah Boston RDCS Referring Phys: [place]  1. Left ventricular ejection fraction, by estimation, is 55 to 60%. The left ventricle has normal function. The left ventricle has no regional wall motion abnormalities. There is mild concentric left ventricular hypertrophy. Left ventricular diastolic parameters are indeterminate.  2. Right ventricular systolic function is normal. The right ventricular size is mildly enlarged. Mildly increased right ventricular wall thickness. There is normal pulmonary artery systolic pressure.  3. Left atrial size was mildly dilated.  4. Right atrial size was mildly dilated.  5. Moderate pericardial effusion. The pericardial effusion is circumferential. There is hepatic vein flow reversal but with normal tissue Doppler: this is not suggestive of constriction.  6. The mitral valve is normal in structure. Mild to moderate mitral valve regurgitation.  7. The aortic valve is normal in structure. Aortic valve regurgitation is not visualized.  8. The inferior vena cava is normal in size with greater than 50% respiratory variability, suggesting right atrial pressure of 3 mmHg. Comparison(s): A prior study was performed on 11/12/20. Prior images reviewed side by side. Slight increase in mitral regurgitation; no evidence of tamponade. FINDINGS  Left Ventricle: Left ventricular ejection fraction, by estimation, is 55 to 60%. The left ventricle has normal function. The left ventricle has no regional wall motion  abnormalities. The left ventricular internal cavity size was normal in size. There is  mild concentric left ventricular hypertrophy. Left ventricular diastolic parameters are indeterminate. Right Ventricle: The right ventricular size is mildly enlarged. Mildly increased right ventricular wall thickness. Right ventricular systolic function is normal. There is normal pulmonary artery systolic pressure. The tricuspid regurgitant velocity is 2.58 m/s, and with an assumed right atrial pressure of 3 mmHg, the estimated right ventricular systolic pressure is 47.0 mmHg. Left Atrium: Left atrial size was mildly dilated. Right Atrium: Right atrial size was mildly dilated. Pericardium: A moderately sized pericardial effusion is present. The pericardial effusion is circumferential. Mitral Valve: The mitral valve is normal in structure. Mild to moderate mitral valve regurgitation. MV peak gradient, 9.7 mmHg. The mean mitral valve gradient is 3.0 mmHg. Aortic Valve: LM and RCA [place] noted. The aortic valve is normal in structure. Aortic valve regurgitation is not visualized. Aortic valve mean gradient measures 7.0 mmHg. Aortic valve peak gradient measures 15.0 mmHg. Aortic valve area, by VTI measures 2.17 cm. Pulmonic Valve: The pulmonic valve was normal in structure. Pulmonic valve regurgitation is not visualized. Aorta: The aortic root and ascending aorta are structurally normal, with no evidence of dilitation. Venous: The inferior vena cava is normal in size with greater than 50% respiratory variability, suggesting right atrial pressure of 3 mmHg.  LEFT VENTRICLE PLAX 2D LVIDd:         5.10 cm  Diastology LVIDs:         3.50 cm  LV e' medial:    11.00 cm/s LV PW:         1.20 cm  LV E/e' medial:  13.6 LV IVS:        0.80 cm  LV e' lateral:   12.80 cm/s LVOT diam:     2.10 cm  LV E/e' lateral: 11.7 LV SV:         91 LV SV Index:   49 LVOT Area:     3.46 cm                          3D Volume EF:                         3D EF:         69 %                         LV EDV:       220 ml                         LV ESV:       68 ml                         LV SV:        152 ml RIGHT VENTRICLE RV S prime:     14.90 cm/s TAPSE (M-mode): 3.2 cm LEFT ATRIUM              Index       RIGHT ATRIUM           Index LA diam:        3.80 cm  2.03 cm/m  RA Area:     26.30 cm LA Vol (A2C):   69.2 ml  36.89 ml/m RA Volume:   90.90 ml  48.46 ml/m LA Vol (A4C):   150.0 ml 79.97 ml/m LA Biplane Vol: 107.0 ml 57.05 ml/m  AORTIC VALVE AV Area (Vmax):    2.27 cm AV Area (Vmean):   2.20 cm AV Area (VTI):     2.17 cm AV Vmax:           193.50 cm/s AV Vmean:          123.000 cm/s AV VTI:            0.420 m AV Peak Grad:      15.0 mmHg AV Mean Grad:      7.0 mmHg LVOT Vmax:         127.00 cm/s LVOT Vmean:        78.250 cm/s LVOT VTI:          0.264 m LVOT/AV VTI ratio: 0.63  AORTA Ao Root diam: 3.00 cm Ao Asc diam:  3.50 cm MITRAL VALVE                TRICUSPID VALVE MV Area (PHT): 2.00 cm     TR Peak grad:   26.6 mmHg MV Peak grad:  9.7 mmHg     TR Vmax:        258.00 cm/s MV Mean grad:  3.0 mmHg MV Vmax:       1.56  m/s     SHUNTS MV Vmean:      78.6 cm/s    Systemic VTI:  0.26 m MV Decel Time: 247 msec     Systemic Diam: 2.10 cm MR Peak grad:   130.0 mmHg MR Mean grad:   84.0 mmHg MR Vmax:        570.00 cm/s MR Vmean:       440.0 cm/s MR PISA:        4.02 cm MR PISA Radius: 0.80 cm MV E velocity: 150.00 cm/s MV A velocity: 120.00 cm/s MV E/A ratio:  1.25 Cathy Haskell MD Electronically signed by Cathy Haskell MD Signature Date/Time: 11/15/2020/3:12:38 PM    Final    ECHOCARDIOGRAM LIMITED  Result Date: 11/10/2020    ECHOCARDIOGRAM LIMITED REPORT   Patient Name:   Cathy [name] Date of Exam: 11/10/2020 Medical Rec #:  [id number]        Height:       67.0 in Accession #:    [id number]       Weight:       159.4 lb Date of Birth:  06/11/1981       BSA:          1.836 m Patient Age:    39 years         BP:           157/113 mmHg Patient  Gender: F                HR:           70 bpm. Exam Location:  Inpatient Procedure: Limited Echo, Limited Color Doppler and Cardiac Doppler Indications:    Pericardial effusion 423.9 / I31.3  History:        Patient has prior history of Echocardiogram examinations, most                 recent 11/08/2020. Risk Factors:Hypertension.  Sonographer:    Bernadene Person RDCS Referring Phys: [place]  1. Left ventricular ejection fraction, by estimation, is 60 to 65%. The left ventricle has normal function. The left ventricle has no regional wall motion abnormalities. There is moderate left ventricular hypertrophy.  2. Right ventricular systolic function is mildly reduced. The right ventricular size is mildly enlarged. There is normal pulmonary artery systolic pressure.  3. Large circumferential effusion persists since echo done 11/08/20 IVC less dilated and actually decreases with respiration but mild decriment in mitral inflows with inspiration No RV/RA collapse in diastole Clinicial correlation suggested with possible  need for right heart cath and diagnostic pericardiocentesis given patients diagnosis of Lupus as well as persistence of effusion . Large pericardial effusion. The pericardial effusion is circumferential.  4. The mitral valve is abnormal. Mild mitral valve regurgitation. No evidence of mitral stenosis.  5. The aortic valve is normal in structure. Aortic valve regurgitation is not visualized. No aortic stenosis is present.  6. The inferior vena cava is dilated in size with >50% respiratory variability, suggesting right atrial pressure of 8 mmHg. FINDINGS  Left Ventricle: Left ventricular ejection fraction, by estimation, is 60 to 65%. The left ventricle has normal function. The left ventricle has no regional wall motion abnormalities. The left ventricular internal cavity size was normal in size. There is  moderate left ventricular hypertrophy. Right Ventricle: The right ventricular size  is mildly enlarged. Right vetricular wall thickness was not assessed. Right ventricular systolic function is mildly reduced. There is normal pulmonary artery systolic  pressure. The tricuspid regurgitant velocity is 2.15 m/s, and with an assumed right atrial pressure of 3 mmHg, the estimated right ventricular systolic pressure is 84.6 mmHg. Left Atrium: Left atrial size was normal in size. Right Atrium: Right atrial size was normal in size. Pericardium: Large circumferential effusion persists since echo done 11/08/20 IVC less dilated and actually decreases with respiration but mild decriment in mitral inflows with inspiration No RV/RA collapse in diastole Clinicial correlation suggested with  possible need for right heart cath and diagnostic pericardiocentesis given patients diagnosis of Lupus as well as persistence of effusion. A large pericardial effusion is present. The pericardial effusion is circumferential. Mitral Valve: The mitral valve is abnormal. There is moderate thickening of the mitral valve leaflet(s). There is mild calcification of the mitral valve leaflet(s). Mild mitral valve regurgitation. No evidence of mitral valve stenosis. Tricuspid Valve: The tricuspid valve is normal in structure. Tricuspid valve regurgitation is mild . No evidence of tricuspid stenosis. Aortic Valve: The aortic valve is normal in structure. Aortic valve regurgitation is not visualized. No aortic stenosis is present. Pulmonic Valve: The pulmonic valve was normal in structure. Pulmonic valve regurgitation is not visualized. No evidence of pulmonic stenosis. Aorta: The aortic root is normal in size and structure. Venous: The inferior vena cava is dilated in size with greater than 50% respiratory variability, suggesting right atrial pressure of 8 mmHg. IAS/Shunts: No atrial level shunt detected by color flow Doppler. LEFT VENTRICLE PLAX 2D LVIDd:         5.00 cm LVIDs:         2.90 cm LV PW:         1.60 cm LV IVS:        1.20 cm  LVOT diam:     1.70 cm LVOT Area:     2.27 cm  LEFT ATRIUM         Index LA diam:    3.50 cm 1.91 cm/m   AORTA Ao Root diam: 2.70 cm Ao Asc diam:  2.70 cm TRICUSPID VALVE TR Peak grad:   18.5 mmHg TR Vmax:        215.00 cm/s  SHUNTS Systemic Diam: 1.70 cm Cathy Rouge MD Electronically signed by Cathy Rouge MD Signature Date/Time: 11/10/2020/2:12:01 PM    Final     Labs:  CBC: Recent Labs    11/29/20 1701 11/30/20 0319 12/01/20 0246 12/02/20 0447  WBC 7.7 6.5 9.6 6.8  HGB 6.5* 8.4* 8.3* 7.4*  HCT 20.9* 24.5* 25.3* 21.2*  PLT 92* 96* 128* 123*    COAGS: No results for input(s): INR, APTT in the last 8760 hours.  BMP: Recent Labs    08/07/20 1209 10/17/20 1015 11/30/20 1047 11/30/20 1506 11/30/20 1846 12/01/20 0247 12/01/20 1406 12/02/20 0447  NA 138   < > 137  --   --  139 136 137  K 3.6   < > 6.1*   < > 5.8* 5.7* 5.2* 5.2*  CL 109   < > 105  --   --  106 105 105  CO2 21*   < > 17*  --   --  19* 17* 20*  GLUCOSE 87   < > 125*  --   --  122* 167* 140*  BUN 9   < > 114*  --   --  123* 96* 101*  CALCIUM 8.4*   < > 7.1*  --   --  6.8* 6.8* 6.5*  CREATININE 0.80   < > 6.53*  --   --  6.74* 6.09* 6.19*  GFRNONAA >60   < > 8*  --   --  8* 9* 8*  GFRAA >60  --   --   --   --   --   --   --    < > = values in this interval not displayed.    LIVER FUNCTION TESTS: Recent Labs    08/07/20 1209 10/17/20 1015 11/06/20 1759 11/07/20 1104 11/08/20 0603 11/11/20 0549 11/30/20 0319 12/01/20 0247 12/02/20 0447  BILITOT 0.5 0.4 0.3 0.4  --   --   --   --   --   AST 31 24 22 22   --   --   --   --   --   ALT 43 18 14 14   --   --   --   --   --   ALKPHOS 174* 112 84 89  --   --   --   --   --   PROT 7.3 5.9* 5.0* 4.8*  --   --   --   --   --   ALBUMIN 2.8* 2.1* 1.6* 1.5*   < > 3.0* 2.2* 1.9* 2.0*   < > = values in this interval not displayed.     Assessment and Plan:  History of AKI without definite cause in setting of SLE (need to R/O lupus nephritis/plan future  treatment). Plan for image-guided random renal biopsy in IR tentatively for tomorrow 12/03/2020 pending IR scheduling. Patient will be NPO at midnight. Afebrile. She does not take blood thinners. INR pending.  Risks and benefits discussed with the patient including, but not limited to bleeding, infection, damage to adjacent structures or low yield requiring additional tests. All of the patient's questions were answered, patient is agreeable to proceed. Consent signed and in IR control room.   Thank you for this interesting consult.  I greatly enjoyed meeting KAILIA STARRY and look forward to participating in their care.  A copy of this report was sent to the requesting provider on this date.  Electronically Signed: Earley Abide, PA-C 12/02/2020, 9:23 AM   I spent a total of 40 Minutes in face to face in clinical consultation, greater than 50% of which was counseling/coordinating care for AKI/random renal biopsy.

## 2020-12-02 NOTE — Progress Notes (Signed)
PROGRESS NOTE    Cathy [name]  UJW:[id number] DOB: Aug 07, 1981 DOA: 11/29/2020 PCP: Patient, No Pcp Per   Brief Narrative: 40 year old with past medical history significant for SLE, CKD stage III AAA/lupus nephritis, hypertension, asthma, substance abuse, bipolar disorder, depression, history of medication noncompliance presents complaining of shortness of breath and bilateral lower extremity edema.  Patient was referred to the ED by her nephrologist due to worsening kidney function and lower extremity edema. Is currently on a prednisone taper for her lupus and reports compliance.  She has not seen a rheumatologist yet.  Of note patient with recent admission from 12/8 on 1212/14 for anasarca and AKI on CKD stage III AAA.  Nephrology and cardiology were consulted.  Her anasarca improved with IV diuretics.  Potassium of 6.4, bicarb 18, creatinine 6.5.  Covid negative.  Chest x-ray showed bilateral pleural effusion. Received IV Laxis, calcium gluconate, IV bicarb Lokelma.   Assessment & Plan:   Principal Problem:   Acute renal failure (ARF) (HCC) Active Problems:   Lupus (HCC)   Anemia   Volume overload   Hyperkalemia   1-AKI on CKD stage IIIa lupus/lupus nephritis: Present with worsening renal function Patient underwent Tunneled HD catheter placement on 12-31. First HD treatment 12/31. On High dose IV steroids.  Kidney  possible Monday if IR schedule permits. . Possible Cytoxan depending on Bx results.  Next HD on Monday.   2-Hyperkalemia: On admission received bicarbonate, Lokelma, calcium gluconate. Repeat dose of Lokelma.   3-Normal anion gap metabolic acidosis ; Likely related to AKI. Continue with sodium bicarb tablet  4-Volume overload/anasarca: Secondary to acute renal failure: Received IV Laxis.   Patient will be initiated on hemodialysis monitor volume status.  On IV Lasix, and HD.   5-Hypertensive urgency: Continue with amlodipine and labetalol.   IV  lasix.  BP improving down to 140.   6-Lupus: Was  On prednisone taper out patient.  Received high-dose Solu-Medrol, for 3 days.  Currently on prednsione Needs Follow-up with rheumatology  7-Thrombocytopenia: Likely secondary to AKI: Monitor  8-Community-acquired pneumonia: Abnormal chest x-ray showed infiltrates: Continue with IV antibiotics.  Procalcitonin elevated. Day 3/5/   9-Pericardial effusion: Recent echo 12/16 revealed moderate pericardial effusion. No evidence of Tamponade hypotension tachycardia  Epigastric pain: Started  PPI   Estimated body mass index is 34.18 kg/m as calculated from the following:   Height as of this encounter: 5' 7"  (1.702 m).   Weight as of this encounter: 99 kg.   DVT prophylaxis: SCDs Code Status: Full code Family Communication: Care discussed with patient and mother who was on the phone 12/01/2020 Disposition Plan:  Status is: Inpatient  Remains inpatient appropriate because:IV treatments appropriate due to intensity of illness or inability to take PO   Dispo: The patient is from: Home              Anticipated d/c is to: Home              Anticipated d/c date is: 3 days              Patient currently is not medically stable to d/c.        Consultants:   Nephrology  IR  Procedures:   HD catheter   Antimicrobials:    Subjective: She is feeling better, still with significant anasarca.  Denies cough   Objective: Vitals:   12/01/20 1746 12/01/20 2054 12/02/20 0500 12/02/20 0925  BP: (!) 143/97 (!) 147/101 (!) 157/114 (!) 148/99  Pulse:  69 69 68 71  Resp: 16 18 18 18   Temp: 98.7 F (37.1 C) 98.1 F (36.7 C) 98.6 F (37 C) 98.6 F (37 C)  TempSrc: Oral  Oral Oral  SpO2: 97% 100% 100% 100%  Weight:      Height:        Intake/Output Summary (Last 24 hours) at 12/02/2020 1317 Last data filed at 12/02/2020 0925 Gross per 24 hour  Intake 1226.04 ml  Output 700 ml  Net 526.04 ml   Filed Weights   11/30/20 0704   Weight: 99 kg    Examination:  General exam: NAD Respiratory system: B/L crackles.  Cardiovascular system: S 1, S 2 RRR Gastrointestinal system: BS present, soft, nt Central nervous system: Non focal.  Extremities: Plus 2 edema   Data Reviewed: I have personally reviewed following labs and imaging studies  CBC: Recent Labs  Lab 11/29/20 1701 11/30/20 0319 12/01/20 0246 12/02/20 0447  WBC 7.7 6.5 9.6 6.8  HGB 6.5* 8.4* 8.3* 7.4*  HCT 20.9* 24.5* 25.3* 21.2*  MCV 92.9 86.9 90.4 85.8  PLT 92* 96* 128* 937*   Basic Metabolic Panel: Recent Labs  Lab 11/30/20 0319 11/30/20 1047 11/30/20 1506 11/30/20 1846 12/01/20 0247 12/01/20 1406 12/02/20 0447  NA 139 137  --   --  139 136 137  K 6.3* 6.1* 5.9* 5.8* 5.7* 5.2* 5.2*  CL 109 105  --   --  106 105 105  CO2 19* 17*  --   --  19* 17* 20*  GLUCOSE 111* 125*  --   --  122* 167* 140*  BUN 117* 114*  --   --  123* 96* 101*  CREATININE 6.26* 6.53*  --   --  6.74* 6.09* 6.19*  CALCIUM 7.2* 7.1*  --   --  6.8* 6.8* 6.5*  PHOS 8.6*  --   --   --  9.2*  --  8.6*   GFR: Estimated Creatinine Clearance: 15 mL/min (A) (by C-G formula based on SCr of 6.19 mg/dL (H)). Liver Function Tests: Recent Labs  Lab 11/30/20 0319 12/01/20 0247 12/02/20 0447  ALBUMIN 2.2* 1.9* 2.0*   No results for input(s): LIPASE, AMYLASE in the last 168 hours. No results for input(s): AMMONIA in the last 168 hours. Coagulation Profile: No results for input(s): INR, PROTIME in the last 168 hours. Cardiac Enzymes: No results for input(s): CKTOTAL, CKMB, CKMBINDEX, TROPONINI in the last 168 hours. BNP (last 3 results) No results for input(s): PROBNP in the last 8760 hours. HbA1C: No results for input(s): HGBA1C in the last 72 hours. CBG: Recent Labs  Lab 11/29/20 1943  GLUCAP 83   Lipid Profile: No results for input(s): CHOL, HDL, LDLCALC, TRIG, CHOLHDL, LDLDIRECT in the last 72 hours. Thyroid Function Tests: No results for input(s): TSH,  T4TOTAL, FREET4, T3FREE, THYROIDAB in the last 72 hours. Anemia Panel: No results for input(s): VITAMINB12, FOLATE, FERRITIN, TIBC, IRON, RETICCTPCT in the last 72 hours. Sepsis Labs: Recent Labs  Lab 11/29/20 2058  PROCALCITON 0.64    Recent Results (from the past 240 hour(s))  Resp Panel by RT-PCR (Flu A&B, Covid) Nasopharyngeal Swab     Status: None   Collection Time: 11/29/20  7:24 PM   Specimen: Nasopharyngeal Swab; Nasopharyngeal(NP) swabs in vial transport medium  Result Value Ref Range Status   SARS Coronavirus 2 by RT PCR NEGATIVE NEGATIVE Final    Comment: (NOTE) SARS-CoV-2 target nucleic acids are NOT DETECTED.  The SARS-CoV-2 RNA is generally detectable  in upper respiratory specimens during the acute phase of infection. The lowest concentration of SARS-CoV-2 viral copies this assay can detect is 138 copies/mL. A negative result does not preclude SARS-Cov-2 infection and should not be used as the sole basis for treatment or other patient management decisions. A negative result may occur with  improper specimen collection/handling, submission of specimen other than nasopharyngeal swab, presence of viral mutation(s) within the areas targeted by this assay, and inadequate number of viral copies(<138 copies/mL). A negative result must be combined with clinical observations, patient history, and epidemiological information. The expected result is Negative.  Fact Sheet for Patients:  EntrepreneurPulse.com.au  Fact Sheet for Healthcare Providers:  IncredibleEmployment.be  This test is no t yet approved or cleared by the [place] FDA and  has been authorized for detection and/or diagnosis of SARS-CoV-2 by FDA under an Emergency Use Authorization (EUA). This EUA will remain  in effect (meaning this test can be used) for the duration of the COVID-19 declaration under Section 564(b)(1) of the Act, 21 U.S.C.section 360bbb-3(b)(1),  unless the authorization is terminated  or revoked sooner.       Influenza A by PCR NEGATIVE NEGATIVE Final   Influenza B by PCR NEGATIVE NEGATIVE Final    Comment: (NOTE) The Xpert Xpress SARS-CoV-2/FLU/RSV plus assay is intended as an aid in the diagnosis of influenza from Nasopharyngeal swab specimens and should not be used as a sole basis for treatment. Nasal washings and aspirates are unacceptable for Xpert Xpress SARS-CoV-2/FLU/RSV testing.  Fact Sheet for Patients: EntrepreneurPulse.com.au  Fact Sheet for Healthcare Providers: IncredibleEmployment.be  This test is not yet approved or cleared by the [place] FDA and has been authorized for detection and/or diagnosis of SARS-CoV-2 by FDA under an Emergency Use Authorization (EUA). This EUA will remain in effect (meaning this test can be used) for the duration of the COVID-19 declaration under Section 564(b)(1) of the Act, 21 U.S.C. section 360bbb-3(b)(1), unless the authorization is terminated or revoked.  Performed at [facility], [place] [address], [place], [place] [address]   Culture, blood (routine x 2)     Status: None (Preliminary result)   Collection Time: 11/30/20  3:19 AM   Specimen: BLOOD LEFT ARM  Result Value Ref Range Status   Specimen Description BLOOD LEFT ARM  Final   Special Requests   Final    BOTTLES DRAWN AEROBIC AND ANAEROBIC Blood Culture results may not be optimal due to an excessive volume of blood received in culture bottles   Culture   Final    NO GROWTH 2 DAYS Performed at [facility], [place] [address], [place], [place] [address]    Report Status PENDING  Incomplete  Culture, blood (routine x 2)     Status: None (Preliminary result)   Collection Time: 11/30/20  3:19 AM   Specimen: BLOOD  Result Value Ref Range Status   Specimen Description BLOOD RIGHT ANTECUBITAL  Final   Special Requests   Final    BOTTLES DRAWN AEROBIC AND ANAEROBIC  Blood Culture results may not be optimal due to an excessive volume of blood received in culture bottles   Culture   Final    NO GROWTH 2 DAYS Performed at [facility], [place] [address] [place], [place] [address]    Report Status PENDING  Incomplete         Radiology Studies: IR Fluoro Guide CV Line Right  Result Date: 12/01/2020 INDICATION: 40 year old female with lupus nephritis  in need of hemodialysis. EXAM: TUNNELED CENTRAL VENOUS HEMODIALYSIS CATHETER PLACEMENT WITH ULTRASOUND AND FLUOROSCOPIC GUIDANCE MEDICATIONS: None. ANESTHESIA/SEDATION: Moderate (conscious) sedation was employed during this procedure. A total of Versed 2 mg and Fentanyl 100 mcg was administered intravenously. Moderate Sedation Time: 27 minutes. The patient's level of consciousness and vital signs were monitored continuously by radiology nursing throughout the procedure under my direct supervision. FLUOROSCOPY TIME:  Fluoroscopy Time: 1 minutes 48 seconds (7 mGy). COMPLICATIONS: None immediate. PROCEDURE: Informed written consent was obtained from the patient after a discussion of the risks, benefits, and alternatives to treatment. Questions regarding the procedure were encouraged and answered. The right neck and chest were prepped with chlorhexidine in a sterile fashion, and a sterile drape was applied covering the operative field. Maximum barrier sterile technique with sterile gowns and gloves were used for the procedure. A timeout was performed prior to the initiation of the procedure. After creating a small venotomy incision, a micropuncture kit was utilized to access the right internal jugular vein under direct, real-time ultrasound guidance after the overlying soft tissues were anesthetized with 1% lidocaine with epinephrine. Ultrasound image documentation was performed. The microwire was kinked to measure appropriate catheter length. A stiff Glidewire was advanced to the level of the IVC and the micropuncture  sheath was exchanged for a peel-away sheath. A tunneled hemodialysis catheter measuring 19 cm from tip to cuff was tunneled in a retrograde fashion from the anterior chest wall to the venotomy incision. The catheter was then placed through the peel-away sheath with tips ultimately positioned within the superior aspect of the right atrium. Final catheter positioning was confirmed and documented with a spot radiographic image. The catheter aspirates and flushes normally. The catheter was flushed with appropriate volume heparin dwells. The catheter exit site was secured with a 0-Prolene retention suture. The venotomy incision was closed with an interrupted 4-0 Vicryl, Dermabond and Steri-strips. Dressings were applied. The patient tolerated the procedure well without immediate post procedural complication. IMPRESSION: Successful placement of 19 cm tip to cuff tunneled hemodialysis catheter via the right internal jugular vein with tips terminating within the superior aspect of the right atrium. The catheter is ready for immediate use. Electronically Signed   By: Jacqulynn Cadet M.D.   On: 12/01/2020 08:39   IR US Guide Vasc Access Right  Result Date: 12/01/2020 INDICATION: 40 year old female with lupus nephritis in need of hemodialysis. EXAM: TUNNELED CENTRAL VENOUS HEMODIALYSIS CATHETER PLACEMENT WITH ULTRASOUND AND FLUOROSCOPIC GUIDANCE MEDICATIONS: None. ANESTHESIA/SEDATION: Moderate (conscious) sedation was employed during this procedure. A total of Versed 2 mg and Fentanyl 100 mcg was administered intravenously. Moderate Sedation Time: 27 minutes. The patient's level of consciousness and vital signs were monitored continuously by radiology nursing throughout the procedure under my direct supervision. FLUOROSCOPY TIME:  Fluoroscopy Time: 1 minutes 48 seconds (7 mGy). COMPLICATIONS: None immediate. PROCEDURE: Informed written consent was obtained from the patient after a discussion of the risks, benefits, and  alternatives to treatment. Questions regarding the procedure were encouraged and answered. The right neck and chest were prepped with chlorhexidine in a sterile fashion, and a sterile drape was applied covering the operative field. Maximum barrier sterile technique with sterile gowns and gloves were used for the procedure. A timeout was performed prior to the initiation of the procedure. After creating a small venotomy incision, a micropuncture kit was utilized to access the right internal jugular vein under direct, real-time ultrasound guidance after the overlying soft tissues were anesthetized with 1% lidocaine with epinephrine. Ultrasound  image documentation was performed. The microwire was kinked to measure appropriate catheter length. A stiff Glidewire was advanced to the level of the IVC and the micropuncture sheath was exchanged for a peel-away sheath. A tunneled hemodialysis catheter measuring 19 cm from tip to cuff was tunneled in a retrograde fashion from the anterior chest wall to the venotomy incision. The catheter was then placed through the peel-away sheath with tips ultimately positioned within the superior aspect of the right atrium. Final catheter positioning was confirmed and documented with a spot radiographic image. The catheter aspirates and flushes normally. The catheter was flushed with appropriate volume heparin dwells. The catheter exit site was secured with a 0-Prolene retention suture. The venotomy incision was closed with an interrupted 4-0 Vicryl, Dermabond and Steri-strips. Dressings were applied. The patient tolerated the procedure well without immediate post procedural complication. IMPRESSION: Successful placement of 19 cm tip to cuff tunneled hemodialysis catheter via the right internal jugular vein with tips terminating within the superior aspect of the right atrium. The catheter is ready for immediate use. Electronically Signed   By: Jacqulynn Cadet M.D.   On: 12/01/2020 08:39         Scheduled Meds: . amLODipine  10 mg Oral Daily  . Chlorhexidine Gluconate Cloth  6 each Topical Q0600  . cholecalciferol  1,000 Units Oral Daily  . labetalol  400 mg Oral BID  . [START ON 12/03/2020] pantoprazole  40 mg Oral Daily  . polyethylene glycol  17 g Oral BID  . [START ON 12/03/2020] predniSONE  40 mg Oral Q breakfast   Continuous Infusions: . sodium chloride    . sodium chloride 250 mL (12/02/20 0602)  . azithromycin Stopped (12/02/20 0005)  . cefTRIAXone (ROCEPHIN)  IV Stopped (12/01/20 2223)  . furosemide 120 mg (12/02/20 0605)     LOS: 3 days    Time spent: 35 minutes.     Elmarie Shiley, MD [facility]   If 7PM-7AM, please contact night-coverage [email or web address]  12/02/2020, 1:17 PM

## 2020-12-02 NOTE — Progress Notes (Signed)
Nephrology Follow-Up Consult note   Assessment/Recommendations: Cathy [name] is a/an 40 y.o. female with a past medical history significant for SLE, HTN, CKD, admitted for volume overload, hyperkalemia, renal failure, presumptive lupus nephritis.     Non-oliguric AKI, severe: Likely secondary to lupus nephritis but other factors contributing. 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  -Avoid nephrotoxic medications including NSAIDs and Vanc/Zosyn combo -quant gold pending  Hyperkalemia: 2/2 AKI. Multiple medications given. Overall improved. Continue lasix and dialysis as above  Volume overload: likely 2/2 ARF and nephrotic syndrome -Dialysis as above -Lasix 120mg  IV BID for now  SLE: known lupus but no kidney biopsy due to compliance issues in the past. Labs on 11/07/20 ANA+, Anti-dsDNA 31, +anti-centromere, chromatin, smith, ro, la. Plan for kidney biopsy, steroids, and further treatment based on results.  Hypertension: likely will improve w/ volume optimization. Steroids contributing -continue norvasc 10mg  daily, labetalol 400mg  BID -Lasix IV BID as above -consider hydral or clonidine if needed  Acidosis: 2/2 aki. Improved  PNA?: procalcitonin elevated. abx per primary  Anemia due to CKD and chronic inflammation: -Transfuse for Hgb<7 g/dL; ctm    Recommendations conveyed to primary service.    [facility] 12/02/2020 9:02 AM  ___________________________________________________________  CC: AKI, hyperkalemia  Interval  History/Subjective: Patient feels well today without significant complaints.  Urine output is improving (lasix)  Labs overall stable   Medications:  Current Facility-Administered Medications  Medication Dose Route Frequency Provider Last Rate Last Admin  . 0.9 %  sodium chloride infusion  10 mL/hr Intravenous Once Shela Leff, MD      . 0.9 %  sodium chloride infusion   Intravenous PRN Regalado, Belkys A, MD 10 mL/hr at 12/02/20 0602 250 mL at 12/02/20 0602  . acetaminophen (TYLENOL) tablet 650 mg  650 mg Oral Q6H PRN Shela Leff, MD       Or  . acetaminophen (TYLENOL) suppository 650 mg  650 mg Rectal Q6H PRN Shela Leff, MD      . albuterol (VENTOLIN HFA) 108 (90 Base) MCG/ACT inhaler 1-2 puff  1-2 puff Inhalation Q4H PRN Shela Leff, MD      . amLODipine (NORVASC) tablet 10 mg  10 mg Oral Daily Shela Leff, MD   10 mg at 12/01/20 1100  . azithromycin (ZITHROMAX) 500 mg in sodium chloride 0.9 % 250 mL IVPB  500 mg Intravenous QHS Shela Leff, MD   Stopped at 12/02/20 0005  . cefTRIAXone (ROCEPHIN) 1 g in sodium chloride 0.9 % 100 mL IVPB  1 g Intravenous QHS Shela Leff, MD   Stopped at 12/01/20 2223  . Chlorhexidine Gluconate Cloth 2 % PADS 6 each  6 each Topical Q0600 Claudia Desanctis, MD   6 each at 12/02/20 [phone]  . furosemide (LASIX) 120 mg in dextrose 5 % 50 mL IVPB  120 mg Intravenous BID Reesa Chew, MD 62 mL/hr at 12/02/20 0605 120 mg at 12/02/20 0605  . labetalol (NORMODYNE) tablet 400 mg  400 mg Oral BID Shela Leff, MD   400 mg at 12/01/20 2135  .  pantoprazole (PROTONIX) injection 40 mg  40 mg Intravenous Q12H Regalado, Belkys A, MD   40 mg at 12/01/20 2132  . polyethylene glycol (MIRALAX / GLYCOLAX) packet 17 g  17 g Oral Daily PRN Shela Leff, MD      . sodium zirconium cyclosilicate (LOKELMA) packet 10 g  10 g Oral Once Regalado, Belkys A, MD          Review of Systems: 10 systems reviewed and negative except  per interval history/subjective  Physical Exam: Vitals:   12/01/20 2054 12/02/20 0500  BP: (!) 147/101 (!) 157/114  Pulse: 69 68  Resp: 18 18  Temp: 98.1 F (36.7 C) 98.6 F (37 C)  SpO2: 100% 100%   No intake/output data recorded.  Intake/Output Summary (Last 24 hours) at 12/02/2020 0902 Last data filed at 12/02/2020 7703 Gross per 24 hour  Intake 1206.04 ml  Output 1100 ml  Net 106.04 ml   Constitutional: well-appearing, no acute distress ENMT: ears and nose without scars or lesions, MMM CV: normal rate, 1+ pitting edema in BLE Respiratory:bilateral chest rise, normal work of breathing Gastrointestinal: soft, non-tender, no palpable masses or hernias Skin: no visible lesions or rashes Psych: alert, judgement/insight appropriate, appropriate mood and affect   Test Results I personally reviewed new and old clinical labs and radiology tests Lab Results  Component Value Date   NA 137 12/02/2020   K 5.2 (H) 12/02/2020   CL 105 12/02/2020   CO2 20 (L) 12/02/2020   BUN 101 (H) 12/02/2020   CREATININE 6.19 (H) 12/02/2020   CALCIUM 6.5 (L) 12/02/2020   ALBUMIN 2.0 (L) 12/02/2020   PHOS 8.6 (H) 12/02/2020

## 2020-12-03 ENCOUNTER — Inpatient Hospital Stay (HOSPITAL_COMMUNITY): Payer: Medicaid Other

## 2020-12-03 DIAGNOSIS — N179 Acute kidney failure, unspecified: Secondary | ICD-10-CM | POA: Diagnosis not present

## 2020-12-03 LAB — PROTIME-INR
INR: 1 (ref 0.8–1.2)
Prothrombin Time: 12.8 seconds (ref 11.4–15.2)

## 2020-12-03 LAB — RENAL FUNCTION PANEL
Albumin: 2 g/dL — ABNORMAL LOW (ref 3.5–5.0)
Anion gap: 16 — ABNORMAL HIGH (ref 5–15)
BUN: 113 mg/dL — ABNORMAL HIGH (ref 6–20)
CO2: 17 mmol/L — ABNORMAL LOW (ref 22–32)
Calcium: 6.6 mg/dL — ABNORMAL LOW (ref 8.9–10.3)
Chloride: 104 mmol/L (ref 98–111)
Creatinine, Ser: 6.38 mg/dL — ABNORMAL HIGH (ref 0.44–1.00)
GFR, Estimated: 8 mL/min — ABNORMAL LOW (ref >60)
Glucose, Bld: 142 mg/dL — ABNORMAL HIGH (ref 70–99)
Phosphorus: 9.1 mg/dL — ABNORMAL HIGH (ref 2.5–4.6)
Potassium: 4.9 mmol/L (ref 3.5–5.1)
Sodium: 137 mmol/L (ref 135–145)

## 2020-12-03 LAB — BPAM RBC
Blood Product Expiration Date: 202202032359
Blood Product Expiration Date: 202202032359
ISSUE DATE / TIME: 202112302031
Unit Type and Rh: 5100
Unit Type and Rh: 5100

## 2020-12-03 LAB — TYPE AND SCREEN
ABO/RH(D): O POS
Antibody Screen: NEGATIVE
Donor AG Type: NEGATIVE
Donor AG Type: NEGATIVE
Unit division: 0
Unit division: 0

## 2020-12-03 LAB — CBC
HCT: 21.2 % — ABNORMAL LOW (ref 36.0–46.0)
Hemoglobin: 7.3 g/dL — ABNORMAL LOW (ref 12.0–15.0)
MCH: 29.7 pg (ref 26.0–34.0)
MCHC: 34.4 g/dL (ref 30.0–36.0)
MCV: 86.2 fL (ref 80.0–100.0)
Platelets: 122 10*3/uL — ABNORMAL LOW (ref 150–400)
RBC: 2.46 MIL/uL — ABNORMAL LOW (ref 3.87–5.11)
RDW: 16 % — ABNORMAL HIGH (ref 11.5–15.5)
WBC: 10 10*3/uL (ref 4.0–10.5)
nRBC: 0 % (ref 0.0–0.2)

## 2020-12-03 MED ORDER — LIDOCAINE HCL (PF) 1 % IJ SOLN
INTRAMUSCULAR | Status: AC
  Filled 2020-12-03: qty 30

## 2020-12-03 MED ORDER — MIDAZOLAM HCL 2 MG/2ML IJ SOLN
INTRAMUSCULAR | Status: AC
  Filled 2020-12-03: qty 2

## 2020-12-03 MED ORDER — FENTANYL CITRATE (PF) 100 MCG/2ML IJ SOLN
INTRAMUSCULAR | Status: AC | PRN
Start: 2020-12-03 — End: 2020-12-03
  Administered 2020-12-03 (×2): 25 ug via INTRAVENOUS
  Administered 2020-12-03: 50 ug via INTRAVENOUS

## 2020-12-03 MED ORDER — FENTANYL CITRATE (PF) 100 MCG/2ML IJ SOLN
50.0000 ug | INTRAMUSCULAR | Status: DC | PRN
  Administered 2020-12-03: 50 ug via INTRAVENOUS
  Filled 2020-12-03: qty 2

## 2020-12-03 MED ORDER — MIDAZOLAM HCL 2 MG/2ML IJ SOLN
INTRAMUSCULAR | Status: AC | PRN
Start: 2020-12-03 — End: 2020-12-03
  Administered 2020-12-03 (×2): 1 mg via INTRAVENOUS

## 2020-12-03 MED ORDER — GELATIN ABSORBABLE 12-7 MM EX MISC
CUTANEOUS | Status: AC
  Filled 2020-12-03: qty 1

## 2020-12-03 MED ORDER — FENTANYL CITRATE (PF) 100 MCG/2ML IJ SOLN
INTRAMUSCULAR | Status: AC
  Filled 2020-12-03: qty 2

## 2020-12-03 NOTE — Procedures (Signed)
Interventional Radiology Procedure Note  Procedure: Ultrasound guided non-focal renal biopsy  Findings: Please refer to procedural dictation for full description. Left lower pole, 16 ga x2.    Complications: None immediate  Estimated Blood Loss: <5 mL  Recommendations: Strict bedrest, 3 hours. Follow up Pathology results.   Ruthann Cancer, MD

## 2020-12-03 NOTE — Progress Notes (Signed)
Round on patient today secondary to education provided 11/30/20. Patient in bed and tearful. Mame voices that she is overwhelmed with all that is taking place with this new diagnosis, not wanting to seem like a burden to staff, wanting a clearer understanding of how her medications are to be administered for pain and notes mild discomfort to insertion site of TDC. Allowed patient time to voice concerns and reinforced that we are here to help care for her. Reviewed patients medications as well as updated her white board for when medication was available should she need it. Patient also requested patient care items, which I then brought to the room. Reinforced necessity of patient to continue to call for assist due to the increased swelling limiting her ROM at this time. Patient verbalizes understanding and was appreciative of the conversation and less tearful. Will continue to follow patient as appropriate.   Dorthey Sawyer, RN  Dialysis Nurse Coordinator [phone]

## 2020-12-03 NOTE — Progress Notes (Signed)
PROGRESS NOTE    Cathy [name]  IDP:[id number] DOB: [birth year] DOA: 11/29/2020 PCP: Patient, No Pcp Per   Brief Narrative: 40 year old with past medical history significant for SLE, CKD stage III AAA/lupus nephritis, hypertension, asthma, substance abuse, bipolar disorder, depression, history of medication noncompliance presents complaining of shortness of breath and bilateral lower extremity edema.  Patient was referred to the ED by her nephrologist due to worsening kidney function and lower extremity edema. Is currently on a prednisone taper for her lupus and reports compliance.  She has not seen a rheumatologist yet.  Of note patient with recent admission from 12/8 on 1212/14 for anasarca and AKI on CKD stage III AAA.  Nephrology and cardiology were consulted.  Her anasarca improved with IV diuretics.  Potassium of 6.4, bicarb 18, creatinine 6.5.  Covid negative.  Chest x-ray showed bilateral pleural effusion. Received IV Laxis, calcium gluconate, IV bicarb Lokelma.   Assessment & Plan:   Principal Problem:   Acute renal failure (ARF) (HCC) Active Problems:   Lupus (HCC)   Anemia   Volume overload   Hyperkalemia   1-AKI on CKD stage IIIa lupus/lupus nephritis: Present with worsening renal function Patient underwent Tunneled HD catheter placement on 12-31. First HD treatment 12/31. On High dose IV steroids.  For Kidney Bx 12/03/2020. Possible Cytoxan depending on Bx results.  For HD today.   2-Hyperkalemia: On admission received bicarbonate, Lokelma, calcium gluconate. Normalized.   3-Normal anion gap metabolic acidosis ; Likely related to AKI. Continue with sodium bicarb tablet  4-Volume overload/anasarca: Secondary to acute renal failure: Received IV Laxis.   Patient will be initiated on hemodialysis monitor volume status.  On IV Lasix, and HD.   5-Hypertensive urgency: Continue with amlodipine and labetalol.   IV lasix.  BP improving down to 140.   6-Lupus:  Was  On prednisone taper out patient.  Received high-dose Solu-Medrol, for 3 days.  Currently on prednisone. Needs Follow-up with rheumatology.  7-Thrombocytopenia: Likely secondary to AKI: Monitor  8-Community-acquired pneumonia: Abnormal chest x-ray showed infiltrates: Continue with IV antibiotics.  Procalcitonin elevated. Day 4/5/   9-Pericardial effusion: Recent echo 12/16 revealed moderate pericardial effusion. No evidence of Tamponade hypotension tachycardia  Epigastric pain: Started  PPI   Estimated body mass index is 34.18 kg/m as calculated from the following:   Height as of this encounter: 5\' 7"  (1.702 m).   Weight as of this encounter: 99 kg.   DVT prophylaxis: SCDs Code Status: Full code Family Communication: Care discussed with patient and mother who was on the phone 12/01/2020 Disposition Plan:  Status is: Inpatient  Remains inpatient appropriate because:IV treatments appropriate due to intensity of illness or inability to take PO   Dispo: The patient is from: Home              Anticipated d/c is to: Home              Anticipated d/c date is: 3 days              Patient currently is not medically stable to d/c.        Consultants:   Nephrology  IR  Procedures:   HD catheter   Antimicrobials:    Subjective: She was able to have Bowel movement.  She feels distended. Still with significant LE edema. Report pain from edema  Objective: Vitals:   12/03/20 0501 12/03/20 0940 12/03/20 1100 12/03/20 1200  BP: (!) 158/108 (!) 168/118 129/67 (!) 143/82  Pulse: 70 74  70 72  Resp: 18 18 15 15   Temp: 98.2 F (36.8 C) 98 F (36.7 C)    TempSrc:  Oral    SpO2: 98% 100% 95% 100%  Weight:      Height:        Intake/Output Summary (Last 24 hours) at 12/03/2020 1324 Last data filed at 12/03/2020 0900 Gross per 24 hour  Intake 767.63 ml  Output 0 ml  Net 767.63 ml   Filed Weights   11/30/20 0704  Weight: 99 kg    Examination:  General  exam: NAD Respiratory system: B/L crackles.  Cardiovascular system: S1, S 2 RRR Gastrointestinal system: BS present, soft, nt, distended.  Central nervous system: Non focal.  Extremities: Plus 2 edema   Data Reviewed: I have personally reviewed following labs and imaging studies  CBC: Recent Labs  Lab 11/29/20 1701 11/30/20 0319 12/01/20 0246 12/02/20 0447 12/03/20 0156  WBC 7.7 6.5 9.6 6.8 10.0  HGB 6.5* 8.4* 8.3* 7.4* 7.3*  HCT 20.9* 24.5* 25.3* 21.2* 21.2*  MCV 92.9 86.9 90.4 85.8 86.2  PLT 92* 96* 128* 123* 784*   Basic Metabolic Panel: Recent Labs  Lab 11/30/20 0319 11/30/20 1047 11/30/20 1506 11/30/20 1846 12/01/20 0247 12/01/20 1406 12/02/20 0447 12/03/20 0156  NA 139 137  --   --  139 136 137 137  K 6.3* 6.1*   < > 5.8* 5.7* 5.2* 5.2* 4.9  CL 109 105  --   --  106 105 105 104  CO2 19* 17*  --   --  19* 17* 20* 17*  GLUCOSE 111* 125*  --   --  122* 167* 140* 142*  BUN 117* 114*  --   --  123* 96* 101* 113*  CREATININE 6.26* 6.53*  --   --  6.74* 6.09* 6.19* 6.38*  CALCIUM 7.2* 7.1*  --   --  6.8* 6.8* 6.5* 6.6*  PHOS 8.6*  --   --   --  9.2*  --  8.6* 9.1*   < > = values in this interval not displayed.   GFR: Estimated Creatinine Clearance: 14.6 mL/min (A) (by C-G formula based on SCr of 6.38 mg/dL (H)). Liver Function Tests: Recent Labs  Lab 11/30/20 0319 12/01/20 0247 12/02/20 0447 12/03/20 0156  ALBUMIN 2.2* 1.9* 2.0* 2.0*   No results for input(s): LIPASE, AMYLASE in the last 168 hours. No results for input(s): AMMONIA in the last 168 hours. Coagulation Profile: Recent Labs  Lab 12/03/20 0156  INR 1.0   Cardiac Enzymes: No results for input(s): CKTOTAL, CKMB, CKMBINDEX, TROPONINI in the last 168 hours. BNP (last 3 results) No results for input(s): PROBNP in the last 8760 hours. HbA1C: No results for input(s): HGBA1C in the last 72 hours. CBG: Recent Labs  Lab 11/29/20 1943  GLUCAP 83   Lipid Profile: No results for input(s):  CHOL, HDL, LDLCALC, TRIG, CHOLHDL, LDLDIRECT in the last 72 hours. Thyroid Function Tests: No results for input(s): TSH, T4TOTAL, FREET4, T3FREE, THYROIDAB in the last 72 hours. Anemia Panel: No results for input(s): VITAMINB12, FOLATE, FERRITIN, TIBC, IRON, RETICCTPCT in the last 72 hours. Sepsis Labs: Recent Labs  Lab 11/29/20 2058  PROCALCITON 0.64    Recent Results (from the past 240 hour(s))  Resp Panel by RT-PCR (Flu A&B, Covid) Nasopharyngeal Swab     Status: None   Collection Time: 11/29/20  7:24 PM   Specimen: Nasopharyngeal Swab; Nasopharyngeal(NP) swabs in vial transport medium  Result Value Ref Range Status  SARS Coronavirus 2 by RT PCR NEGATIVE NEGATIVE Final    Comment: (NOTE) SARS-CoV-2 target nucleic acids are NOT DETECTED.  The SARS-CoV-2 RNA is generally detectable in upper respiratory specimens during the acute phase of infection. The lowest concentration of SARS-CoV-2 viral copies this assay can detect is 138 copies/mL. A negative result does not preclude SARS-Cov-2 infection and should not be used as the sole basis for treatment or other patient management decisions. A negative result may occur with  improper specimen collection/handling, submission of specimen other than nasopharyngeal swab, presence of viral mutation(s) within the areas targeted by this assay, and inadequate number of viral copies(<138 copies/mL). A negative result must be combined with clinical observations, patient history, and epidemiological information. The expected result is Negative.  Fact Sheet for Patients:  EntrepreneurPulse.com.au  Fact Sheet for Healthcare Providers:  IncredibleEmployment.be  This test is no t yet approved or cleared by the [place] FDA and  has been authorized for detection and/or diagnosis of SARS-CoV-2 by FDA under an Emergency Use Authorization (EUA). This EUA will remain  in effect (meaning this test can be  used) for the duration of the COVID-19 declaration under Section 564(b)(1) of the Act, 21 U.S.C.section 360bbb-3(b)(1), unless the authorization is terminated  or revoked sooner.       Influenza A by PCR NEGATIVE NEGATIVE Final   Influenza B by PCR NEGATIVE NEGATIVE Final    Comment: (NOTE) The Xpert Xpress SARS-CoV-2/FLU/RSV plus assay is intended as an aid in the diagnosis of influenza from Nasopharyngeal swab specimens and should not be used as a sole basis for treatment. Nasal washings and aspirates are unacceptable for Xpert Xpress SARS-CoV-2/FLU/RSV testing.  Fact Sheet for Patients: EntrepreneurPulse.com.au  Fact Sheet for Healthcare Providers: IncredibleEmployment.be  This test is not yet approved or cleared by the [place] FDA and has been authorized for detection and/or diagnosis of SARS-CoV-2 by FDA under an Emergency Use Authorization (EUA). This EUA will remain in effect (meaning this test can be used) for the duration of the COVID-19 declaration under Section 564(b)(1) of the Act, 21 U.S.C. section 360bbb-3(b)(1), unless the authorization is terminated or revoked.  Performed at [facility], [place] [address], [place], [place] [address]   Culture, blood (routine x 2)     Status: None (Preliminary result)   Collection Time: 11/30/20  3:19 AM   Specimen: BLOOD LEFT ARM  Result Value Ref Range Status   Specimen Description BLOOD LEFT ARM  Final   Special Requests   Final    BOTTLES DRAWN AEROBIC AND ANAEROBIC Blood Culture results may not be optimal due to an excessive volume of blood received in culture bottles   Culture   Final    NO GROWTH 3 DAYS Performed at [facility], [place] [address] [place], [place] [address]    Report Status PENDING  Incomplete  Culture, blood (routine x 2)     Status: None (Preliminary result)   Collection Time: 11/30/20  3:19 AM   Specimen: BLOOD  Result Value Ref Range Status    Specimen Description BLOOD RIGHT ANTECUBITAL  Final   Special Requests   Final    BOTTLES DRAWN AEROBIC AND ANAEROBIC Blood Culture results may not be optimal due to an excessive volume of blood received in culture bottles   Culture   Final    NO GROWTH 3 DAYS Performed at [facility], [place] [address] [place], [place] [address]    Report Status PENDING  Incomplete         Radiology Studies: No results found.      Scheduled Meds: . amLODipine  10 mg Oral Daily  . Chlorhexidine Gluconate Cloth  6 each Topical Q0600  . cholecalciferol  1,000 Units Oral Daily  . hydrocortisone cream   Topical BID  . labetalol  400 mg Oral BID  . pantoprazole  40 mg Oral Daily  . polyethylene glycol  17 g Oral BID  . predniSONE  40 mg Oral Q breakfast   Continuous Infusions: . sodium chloride    . sodium chloride Stopped (12/03/20 0650)  . azithromycin Stopped (12/03/20 0038)  . cefTRIAXone (ROCEPHIN)  IV Stopped (12/02/20 2218)  . furosemide Stopped (12/03/20 0650)     LOS: 4 days    Time spent: 35 minutes.     Elmarie Shiley, MD [facility]   If 7PM-7AM, please contact night-coverage [email or web address]  12/03/2020, 1:24 PM

## 2020-12-03 NOTE — Progress Notes (Signed)
[facility] Progress Note    Assessment/ Plan:   Assessment/Recommendations: Cathy [name] is a/an 40 y.o. female with a past medical history significant for SLE, HTN, CKD, admitted for volume overload, hyperkalemia, renal failure, presumptive lupus nephritis.     Non-oliguric AKI, severe: Likely secondary to lupus nephritis but other factors contributing.  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 Specimen handling), incubation performed - HD #1 1/1, next planned today 12/03/20 - biopsy today 12/03/20  Hyperkalemia: 2/2 AKI. Multiple medications given. Overall improved. Continue lasix and dialysis as above  Volume overload: likely 2/2 ARF and nephrotic syndrome -Dialysis as above -Lasix 120mg  IV BID for now  SLE: known lupus but no kidney biopsy due to compliance issues in the past. Labs on 11/07/20 ANA+, Anti-dsDNA 31, +anti-centromere, chromatin, smith, ro, la. Plan for kidney biopsy, steroids, and further treatment based on results.  Hypertension: likely will improve w/ volume optimization. Steroids contributing -continue norvasc 10mg  daily, labetalol 400mg  BID -Lasix IV BID as above  Acidosis: 2/2 aki. Improved  PNA?: procalcitonin elevated. abx per primary  Anemia due to CKD  -Transfuse for Hgb<7 g/dL; ctm  Subjective:     Seen in room.  For HD and biopsy today.  Continues on Pred   Objective:   BP 129/67   Pulse 70   Temp 98 F (36.7 C) (Oral)   Resp 15   Ht 5\' 7"  (1.702 m)   Wt 99 kg   SpO2 95%   BMI 34.18 kg/m   Intake/Output Summary (Last 24 hours) at 12/03/2020 1132 Last data filed at 12/03/2020 0900 Gross per 24 hour  Intake 1007.63 ml  Output 175 ml  Net 832.63 ml   Weight change:   Physical  Exam: Gen: NAD, lying in bed CVS: RRR Resp: clear Abd: soft Ext:3+ LE edema ACCESS: R IJ TDC  Imaging: No results found.  Labs: BMET Recent Labs  Lab 11/29/20 1701 11/29/20 2058 11/30/20 0319 11/30/20 1047 11/30/20 1506 11/30/20 1846 12/01/20 0247 12/01/20 1406 12/02/20 0447 12/03/20 0156  NA 140  --  139 137  --   --  139 136 137 137  K 6.4*   < > 6.3* 6.1* 5.9* 5.8* 5.7* 5.2* 5.2* 4.9  CL 111  --  109 105  --   --  106 105 105 104  CO2 18*  --  19* 17*  --   --  19* 17* 20* 17*  GLUCOSE 118*  --  111* 125*  --   --  122* 167* 140* 142*  BUN 119*  --  117* 114*  --   --  123* 96* 101* 113*  CREATININE 6.59*  --  6.26* 6.53*  --   --  6.74* 6.09* 6.19* 6.38*  CALCIUM 7.3*  --  7.2* 7.1*  --   --  6.8* 6.8* 6.5* 6.6*  PHOS  --   --  8.6*  --   --   --  9.2*  --  8.6* 9.1*   < > = values in this interval not displayed.   CBC Recent Labs  Lab 11/30/20 0319 12/01/20 0246 12/02/20 0447 12/03/20 0156  WBC 6.5 9.6 6.8 10.0  HGB 8.4* 8.3* 7.4* 7.3*  HCT 24.5*  25.3* 21.2* 21.2*  MCV 86.9 90.4 85.8 86.2  PLT 96* 128* 123* 122*    Medications:    . amLODipine  10 mg Oral Daily  . Chlorhexidine Gluconate Cloth  6 each Topical Q0600  . cholecalciferol  1,000 Units Oral Daily  . hydrocortisone cream   Topical BID  . labetalol  400 mg Oral BID  . pantoprazole  40 mg Oral Daily  . polyethylene glycol  17 g Oral BID  . predniSONE  40 mg Oral Q breakfast      Madelon Lips, MD 12/03/2020, 11:32 AM

## 2020-12-04 DIAGNOSIS — N179 Acute kidney failure, unspecified: Secondary | ICD-10-CM | POA: Diagnosis not present

## 2020-12-04 LAB — CBC
HCT: 19.6 % — ABNORMAL LOW (ref 36.0–46.0)
HCT: 24.3 % — ABNORMAL LOW (ref 36.0–46.0)
Hemoglobin: 6.7 g/dL — CL (ref 12.0–15.0)
Hemoglobin: 8.5 g/dL — ABNORMAL LOW (ref 12.0–15.0)
MCH: 29.6 pg (ref 26.0–34.0)
MCH: 29.9 pg (ref 26.0–34.0)
MCHC: 34.2 g/dL (ref 30.0–36.0)
MCHC: 35 g/dL (ref 30.0–36.0)
MCV: 86.7 fL (ref 80.0–100.0)
MCV: 85.6 fL (ref 80.0–100.0)
Platelets: DECREASED 10*3/uL (ref 150–400)
Platelets: 121 10*3/uL — ABNORMAL LOW (ref 150–400)
RBC: 2.26 MIL/uL — ABNORMAL LOW (ref 3.87–5.11)
RBC: 2.84 MIL/uL — ABNORMAL LOW (ref 3.87–5.11)
RDW: 16.1 % — ABNORMAL HIGH (ref 11.5–15.5)
RDW: 15.4 % (ref 11.5–15.5)
WBC: 8 10*3/uL (ref 4.0–10.5)
WBC: 8.1 10*3/uL (ref 4.0–10.5)
nRBC: 0 % (ref 0.0–0.2)
nRBC: 0.2 % (ref 0.0–0.2)

## 2020-12-04 LAB — RENAL FUNCTION PANEL
Albumin: 2 g/dL — ABNORMAL LOW (ref 3.5–5.0)
Anion gap: 11 (ref 5–15)
BUN: 73 mg/dL — ABNORMAL HIGH (ref 6–20)
CO2: 21 mmol/L — ABNORMAL LOW (ref 22–32)
Calcium: 6.7 mg/dL — ABNORMAL LOW (ref 8.9–10.3)
Chloride: 105 mmol/L (ref 98–111)
Creatinine, Ser: 4.45 mg/dL — ABNORMAL HIGH (ref 0.44–1.00)
GFR, Estimated: 12 mL/min — ABNORMAL LOW (ref >60)
Glucose, Bld: 129 mg/dL — ABNORMAL HIGH (ref 70–99)
Phosphorus: 6.8 mg/dL — ABNORMAL HIGH (ref 2.5–4.6)
Potassium: 3.5 mmol/L (ref 3.5–5.1)
Sodium: 137 mmol/L (ref 135–145)

## 2020-12-04 LAB — FERRITIN: Ferritin: 617 ng/mL — ABNORMAL HIGH (ref 11–307)

## 2020-12-04 LAB — RETICULOCYTES
Immature Retic Fract: 6 % (ref 2.3–15.9)
RBC.: 2.22 MIL/uL — ABNORMAL LOW (ref 3.87–5.11)
Retic Count, Absolute: 61.7 10*3/uL (ref 19.0–186.0)
Retic Ct Pct: 2.8 % (ref 0.4–3.1)

## 2020-12-04 LAB — VITAMIN B12: Vitamin B-12: 189 pg/mL (ref 180–914)

## 2020-12-04 LAB — PREPARE RBC (CROSSMATCH)

## 2020-12-04 LAB — FOLATE: Folate: 5.7 ng/mL — ABNORMAL LOW (ref >5.9)

## 2020-12-04 LAB — IRON AND TIBC
Iron: 73 ug/dL (ref 28–170)
Saturation Ratios: 33 % — ABNORMAL HIGH (ref 10.4–31.8)
TIBC: 221 ug/dL — ABNORMAL LOW (ref 250–450)
UIBC: 148 ug/dL

## 2020-12-04 MED ORDER — SODIUM CHLORIDE 0.9% IV SOLUTION: Freq: Once | INTRAVENOUS | Status: AC

## 2020-12-04 MED ORDER — FOLIC ACID 1 MG PO TABS
1.0000 mg | ORAL_TABLET | Freq: Every day | ORAL | Status: DC
  Administered 2020-12-04 – 2020-12-14 (×11): 1 mg via ORAL
  Filled 2020-12-04 (×11): qty 1

## 2020-12-04 MED ORDER — LABETALOL HCL 5 MG/ML IV SOLN
10.0000 mg | INTRAVENOUS | Status: DC | PRN
  Administered 2020-12-04 – 2020-12-13 (×5): 10 mg via INTRAVENOUS
  Filled 2020-12-04 (×6): qty 4

## 2020-12-04 MED ORDER — VITAMIN B-12 1000 MCG PO TABS
1000.0000 ug | ORAL_TABLET | Freq: Every day | ORAL | Status: AC
  Administered 2020-12-04 – 2020-12-10 (×7): 1000 ug via ORAL
  Filled 2020-12-04 (×7): qty 1

## 2020-12-04 MED ORDER — LORAZEPAM 2 MG/ML IJ SOLN
1.0000 mg | Freq: Once | INTRAMUSCULAR | Status: AC | PRN
  Administered 2020-12-04: 1 mg via INTRAVENOUS
  Filled 2020-12-04: qty 1

## 2020-12-04 MED ORDER — HEPARIN SODIUM (PORCINE) 1000 UNIT/ML IJ SOLN
INTRAMUSCULAR | Status: AC
  Filled 2020-12-04: qty 4

## 2020-12-04 MED ORDER — AZITHROMYCIN 500 MG PO TABS
500.0000 mg | ORAL_TABLET | Freq: Once | ORAL | Status: AC
  Administered 2020-12-04: 500 mg via ORAL
  Filled 2020-12-04: qty 1

## 2020-12-04 MED ORDER — SODIUM CHLORIDE 0.9 % IV SOLN
2.0000 g | Freq: Every day | INTRAVENOUS | Status: AC
  Administered 2020-12-04: 2 g via INTRAVENOUS
  Filled 2020-12-04: qty 20

## 2020-12-04 NOTE — Progress Notes (Signed)
   12/04/20 0458  Hand-Off documentation  Handoff Given Given to shift RN/LPN  Report given to (Full Name) Lovett Sox, RN  Handoff Received Received from shift RN/LPN  Report received from (Full Name) Huda Petrey, RN  Vitals  Temp 98.5 F (36.9 C)  Temp Source Oral  BP (!) 175/111  MAP (mmHg) 130  BP Location Left Arm  BP Method Automatic  Patient Position (if appropriate) Lying  Pulse Rate (!) 57  Pulse Rate Source Monitor  ECG Heart Rate (!) 57  Resp 19  Oxygen Therapy  SpO2 100 %  O2 Device Nasal Cannula  O2 Flow Rate (L/min) 2 L/min  Patient Activity (if Appropriate) In bed  Pulse Oximetry Type Continuous  Pain Assessment  Pain Scale 0-10  Pain Score 0  Post-Hemodialysis Assessment  Rinseback Volume (mL) 250 mL  KECN 254 V  Dialyzer Clearance Lightly streaked  Duration of HD Treatment -hour(s) 2.5 hour(s)  Hemodialysis Intake (mL) 700 mL  UF Total -Machine (mL) 1700 mL  Net UF (mL) 1000 mL  Tolerated HD Treatment Yes  Post-Hemodialysis Comments tx achieved as expected, not tolerated BP under 502 sistolic , she really get sick. a/ox4, verbally responsive, and stable.  AVG/AVF Arterial Site Held (minutes) 0 minutes  AVG/AVF Venous Site Held (minutes) 0 minutes  Education / Care Plan  Dialysis Education Provided Yes  Hemodialysis Catheter Right Internal jugular Double lumen Permanent (Tunneled)  Placement Date/Time: 11/30/20 1653   Time Out: Correct patient;Correct site;Correct procedure  Maximum sterile barrier precautions: Hand hygiene;Cap;Mask;Sterile gown;Sterile gloves;Large sterile sheet  Site Prep: Chlorhexidine (preferred);Skin Prep C...  Site Condition No complications  Blue Lumen Status Heparin locked;Capped (Central line)  Red Lumen Status Heparin locked;Capped (Central line)  Catheter fill solution Heparin 1000 units/ml  Catheter fill volume (Arterial) 1.6 cc  Catheter fill volume (Venous) 1.6  Dressing Type Occlusive  Dressing Status  Clean;Dry;Intact  Interventions New dressing  Drainage Description None  Dressing Change Due 12/11/20  Post treatment catheter status Capped and Clamped

## 2020-12-04 NOTE — Progress Notes (Signed)
[facility] Progress Note    Assessment/ Plan:   Assessment/Recommendations: Cathy [name] is a/an 40 y.o. female with a past medical history significant for SLE, HTN, CKD, admitted for volume overload, hyperkalemia, renal failure, presumptive lupus nephritis.     Non-oliguric AKI, severe: Likely secondary to lupus nephritis but other factors contributing.  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 Specimen handling), incubation performed - HD #1 1/1, HD #2 12/04/20 - biopsy 12/03/20 PM, awaiting results  Hyperkalemia: 2/2 AKI. Multiple medications given. Overall improved. Continue lasix and dialysis as above  Volume overload: likely 2/2 ARF and nephrotic syndrome -Dialysis as above -Lasix 120mg  IV BID for now  SLE: known lupus but no kidney biopsy due to compliance issues in the past. Labs on 11/07/20 ANA+, Anti-dsDNA 31, +anti-centromere, chromatin, smith, ro, la. Plan for kidney biopsy, steroids, and further treatment based on results.  Hypertension: likely will improve w/ volume optimization. Steroids contributing -continue norvasc 10mg  daily, labetalol 400mg  BID -Lasix IV BID as above  Acidosis: 2/2 aki. Improved  PNA?: procalcitonin elevated. abx per primary  Anemia due to CKD  -Transfuse for Hgb<7 g/dL; ctm - anemia workup drawn today - PM hgb  Subjective:     Seen in room.  S/p biopsy yesterday, finished dialysis in the wee hours of this AM.  Still has overall body anasarca.  Hgb 6.7 this AM, for 1 u pRBCs   Objective:   BP (!) 175/111 (BP Location: Left Arm)   Pulse (!) 57   Temp 98.5 F (36.9 C) (Oral)   Resp 19   Ht 5\' 7"  (1.702 m)   Wt 99 kg   SpO2 100%   BMI 34.18 kg/m   Intake/Output Summary (Last 24  hours) at 12/04/2020 1048 Last data filed at 12/04/2020 0700 Gross per 24 hour  Intake 460 ml  Output 1575 ml  Net -1115 ml   Weight change:   Physical Exam: Gen: NAD, lying in bed CVS: RRR Resp: clear Abd: soft Ext:3+ LE edema/ dependent and flank edema and anasarca ACCESS: R IJ [facility]  Imaging: US BIOPSY (KIDNEY)  Result Date: 12/03/2020 INDICATION: 40 year old female with history of SLE and CKD presenting with AK I. EXAM: ULTRASOUND GUIDED RENAL BIOPSY COMPARISON:  11/30/2020 MEDICATIONS: None. ANESTHESIA/SEDATION: Fentanyl 100 mcg IV; Versed 2 mg IV Total Moderate Sedation time: 15 minutes; The patient was continuously monitored during the procedure by the interventional radiology nurse under my direct supervision. COMPLICATIONS: None immediate. PROCEDURE: Informed written consent was obtained from the patient after a discussion of the risks, benefits and alternatives to treatment. The patient understands and consents the procedure. A timeout was performed prior to the initiation of the procedure. Ultrasound scanning was performed of the bilateral flanks. The inferior pole of the left kidney was selected for biopsy due to location and sonographic window. The procedure was planned. The operative site was prepped and draped in the usual sterile fashion. The overlying soft tissues were anesthetized with 1% lidocaine with epinephrine. A 16 gauge core needle biopsy device was advanced into the inferior cortex of the left kidney and 2 core biopsies were obtained under direct ultrasound guidance. Images were saved for documentation purposes. The biopsy device was removed and hemostasis was obtained  with manual compression. Post procedural scanning was negative for significant post procedural hemorrhage or additional complication. A dressing was placed. The patient tolerated the procedure well without immediate post procedural complication. IMPRESSION: Technically successful ultrasound guided left renal  biopsy. Ruthann Cancer, MD [facility] Radiology Electronically Signed   By: Ruthann Cancer MD   On: 12/03/2020 16:23    Labs: BMET Recent Labs  Lab 11/30/20 0319 11/30/20 1047 11/30/20 1506 11/30/20 1846 12/01/20 0247 12/01/20 1406 12/02/20 0447 12/03/20 0156 12/04/20 0701  NA 139 137  --   --  139 136 137 137 137  K 6.3* 6.1* 5.9* 5.8* 5.7* 5.2* 5.2* 4.9 3.5  CL 109 105  --   --  106 105 105 104 105  CO2 19* 17*  --   --  19* 17* 20* 17* 21*  GLUCOSE 111* 125*  --   --  122* 167* 140* 142* 129*  BUN 117* 114*  --   --  123* 96* 101* 113* 73*  CREATININE 6.26* 6.53*  --   --  6.74* 6.09* 6.19* 6.38* 4.45*  CALCIUM 7.2* 7.1*  --   --  6.8* 6.8* 6.5* 6.6* 6.7*  PHOS 8.6*  --   --   --  9.2*  --  8.6* 9.1* 6.8*   CBC Recent Labs  Lab 12/01/20 0246 12/02/20 0447 12/03/20 0156 12/04/20 0701  WBC 9.6 6.8 10.0 8.0  HGB 8.3* 7.4* 7.3* 6.7*  HCT 25.3* 21.2* 21.2* 19.6*  MCV 90.4 85.8 86.2 86.7  PLT 128* 123* 122* PLATELET CLUMPS NOTED ON SMEAR, COUNT APPEARS DECREASED    Medications:    . sodium chloride   Intravenous Once  . amLODipine  10 mg Oral Daily  . Chlorhexidine Gluconate Cloth  6 each Topical Q0600  . cholecalciferol  1,000 Units Oral Daily  . heparin sodium (porcine)      . hydrocortisone cream   Topical BID  . labetalol  400 mg Oral BID  . pantoprazole  40 mg Oral Daily  . polyethylene glycol  17 g Oral BID  . predniSONE  40 mg Oral Q breakfast      Madelon Lips, MD 12/04/2020, 10:48 AM

## 2020-12-04 NOTE — Progress Notes (Signed)
PHARMACIST - PHYSICIAN COMMUNICATION DR:   Tyrell Antonio CONCERNING: Antibiotic IV to Oral Route Change Policy  RECOMMENDATION: This patient is receiving azithromycin by the intravenous route.  Based on criteria approved by the Pharmacy and Therapeutics Committee, the antibiotic(s) is/are being converted to the equivalent oral dose form(s).   DESCRIPTION: These criteria include:  Patient being treated for a respiratory tract infection, urinary tract infection, cellulitis or clostridium difficile associated diarrhea if on metronidazole  The patient is not neutropenic and does not exhibit a GI malabsorption state  The patient is eating (either orally or via tube) and/or has been taking other orally administered medications for a least 24 hours  The patient is improving clinically and has a Tmax < 100.5  If you have questions about this conversion, please contact the Pharmacy Department  []   [phone] )  [facility] []   [phone] )  [facility] [x]   [phone] )  [facility] []   [phone] )  [facility] []   [phone] )  [facility]

## 2020-12-04 NOTE — Progress Notes (Signed)
PROGRESS NOTE    Cathy [name]  XIP:[id number] DOB: 1981/02/20 DOA: 11/29/2020 PCP: Patient, No Pcp Per   Brief Narrative: 40 year old with past medical history significant for SLE, CKD stage III AAA/lupus nephritis, hypertension, asthma, substance abuse, bipolar disorder, depression, history of medication noncompliance presents complaining of shortness of breath and bilateral lower extremity edema.  Patient was referred to the ED by her nephrologist due to worsening kidney function and lower extremity edema. She was  on a prednisone taper for her lupus and reports compliance.  She has not seen a rheumatologist yet.  Of note patient with recent admission from 12/8 on 1212/14 for anasarca and AKI on CKD stage III AAA.  Nephrology and cardiology were consulted.  Her anasarca improved with IV diuretics.  Potassium of 6.4, bicarb 18, creatinine 6.5.  Covid negative.  Chest x-ray showed bilateral pleural effusion. Received IV Laxis, calcium gluconate, IV bicarb Lokelma.  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 Started on HD. Underwent Kidney Bx 1/03. Results pending.    Assessment & Plan:   Principal Problem:   Acute renal failure (ARF) (HCC) Active Problems:   Lupus (HCC)   Anemia   Volume overload   Hyperkalemia   1-AKI on CKD stage IIIa lupus/lupus nephritis: Present with worsening renal function Patient underwent Tunneled HD catheter placement on 12-31. First HD treatment 12/31. Received  High dose IV steroids. Currently on prednisone.  For Kidney Bx 12/03/2020. Possible Cytoxan depending on Bx results.  Continue with HD and IV lasix.   2-Hyperkalemia: On admission received bicarbonate, Lokelma, calcium gluconate. Normalized.   3-Normal anion gap metabolic acidosis ; Likely related to AKI. Continue with sodium bicarb tablet  4-Volume overload/anasarca: Secondary to acute renal failure: Received IV Laxis.   Patient will be  initiated on hemodialysis monitor volume status.  On IV Lasix, and HD.   5-Hypertensive urgency: Continue with amlodipine and labetalol.   IV lasix.  BP improving down to 140.   6-Lupus: Was  On prednisone taper out patient.  Received high-dose Solu-Medrol, for 3 days.  Currently on prednisone. Needs Follow-up with rheumatology.  7-Thrombocytopenia: Likely secondary to AKI: Monitor  8-Community-acquired pneumonia: Abnormal chest x-ray showed infiltrates: Continue with IV antibiotics.  Procalcitonin elevated. Day 5/5/ last dose of antibiotics today.   9-Pericardial effusion: Recent echo 12/16 revealed moderate pericardial effusion. No evidence of Tamponade hypotension tachycardia  Epigastric pain: Started  PPI  Anemia;  Transfuse one unit PRBC.  Check anemia panel.  Plan to repeat Hb tonight.  Monitor closely due to recent Kidney bx.    Estimated body mass index is 34.18 kg/m as calculated from the following:   Height as of this encounter: 5\' 7"  (1.702 m).   Weight as of this encounter: 99 kg.   DVT prophylaxis: SCDs Code Status: Full code Family Communication: Care discussed with patient and mother who was on the phone 12/01/2020 Disposition Plan:  Status is: Inpatient  Remains inpatient appropriate because:IV treatments appropriate due to intensity of illness or inability to take PO   Dispo: The patient is from: Home              Anticipated d/c is to: Home              Anticipated d/c date is: 3 days              Patient currently is not medically stable to d/c.        Consultants:  Nephrology  IR  Procedures:   HD catheter   Antimicrobials:    Subjective: She is complaining of generalized pain.  Fluid is coming off.  Had BM  Objective: Vitals:   12/04/20 0358 12/04/20 0413 12/04/20 0428 12/04/20 0458  BP: (!) 163/106 (!) 157/101 (!) 152/98 (!) 175/111  Pulse: (!) 56 (!) 59 (!) 57 (!) 57  Resp: 15 14 14 19   Temp:    98.5 F (36.9  C)  TempSrc:    Oral  SpO2: 100% 100% 100% 100%  Weight:      Height:        Intake/Output Summary (Last 24 hours) at 12/04/2020 1211 Last data filed at 12/04/2020 0700 Gross per 24 hour  Intake 460 ml  Output 1575 ml  Net -1115 ml   Filed Weights   11/30/20 0704  Weight: 99 kg    Examination:  General exam: NAD Respiratory system: B/L crackles.  Cardiovascular system: S 1, S 2 RRR Gastrointestinal system: BS present, soft, nt, distended.  Central nervous system: Non focal; Extremities: Plus 2 edema, less tightness   Data Reviewed: I have personally reviewed following labs and imaging studies  CBC: Recent Labs  Lab 11/30/20 0319 12/01/20 0246 12/02/20 0447 12/03/20 0156 12/04/20 0701  WBC 6.5 9.6 6.8 10.0 8.0  HGB 8.4* 8.3* 7.4* 7.3* 6.7*  HCT 24.5* 25.3* 21.2* 21.2* 19.6*  MCV 86.9 90.4 85.8 86.2 86.7  PLT 96* 128* 123* 122* PLATELET CLUMPS NOTED ON SMEAR, COUNT APPEARS DECREASED   Basic Metabolic Panel: Recent Labs  Lab 11/30/20 0319 11/30/20 1047 12/01/20 0247 12/01/20 1406 12/02/20 0447 12/03/20 0156 12/04/20 0701  NA 139   < > 139 136 137 137 137  K 6.3*   < > 5.7* 5.2* 5.2* 4.9 3.5  CL 109   < > 106 105 105 104 105  CO2 19*   < > 19* 17* 20* 17* 21*  GLUCOSE 111*   < > 122* 167* 140* 142* 129*  BUN 117*   < > 123* 96* 101* 113* 73*  CREATININE 6.26*   < > 6.74* 6.09* 6.19* 6.38* 4.45*  CALCIUM 7.2*   < > 6.8* 6.8* 6.5* 6.6* 6.7*  PHOS 8.6*  --  9.2*  --  8.6* 9.1* 6.8*   < > = values in this interval not displayed.   GFR: Estimated Creatinine Clearance: 20.9 mL/min (A) (by C-G formula based on SCr of 4.45 mg/dL (H)). Liver Function Tests: Recent Labs  Lab 11/30/20 0319 12/01/20 0247 12/02/20 0447 12/03/20 0156 12/04/20 0701  ALBUMIN 2.2* 1.9* 2.0* 2.0* 2.0*   No results for input(s): LIPASE, AMYLASE in the last 168 hours. No results for input(s): AMMONIA in the last 168 hours. Coagulation Profile: Recent Labs  Lab 12/03/20 0156   INR 1.0   Cardiac Enzymes: No results for input(s): CKTOTAL, CKMB, CKMBINDEX, TROPONINI in the last 168 hours. BNP (last 3 results) No results for input(s): PROBNP in the last 8760 hours. HbA1C: No results for input(s): HGBA1C in the last 72 hours. CBG: Recent Labs  Lab 11/29/20 1943  GLUCAP 83   Lipid Profile: No results for input(s): CHOL, HDL, LDLCALC, TRIG, CHOLHDL, LDLDIRECT in the last 72 hours. Thyroid Function Tests: No results for input(s): TSH, T4TOTAL, FREET4, T3FREE, THYROIDAB in the last 72 hours. Anemia Panel: Recent Labs    12/04/20 0937  RETICCTPCT 2.8   Sepsis Labs: Recent Labs  Lab 11/29/20 2058  PROCALCITON 0.64    Recent Results (from the past  240 hour(s))  Resp Panel by RT-PCR (Flu A&B, Covid) Nasopharyngeal Swab     Status: None   Collection Time: 11/29/20  7:24 PM   Specimen: Nasopharyngeal Swab; Nasopharyngeal(NP) swabs in vial transport medium  Result Value Ref Range Status   SARS Coronavirus 2 by RT PCR NEGATIVE NEGATIVE Final    Comment: (NOTE) SARS-CoV-2 target nucleic acids are NOT DETECTED.  The SARS-CoV-2 RNA is generally detectable in upper respiratory specimens during the acute phase of infection. The lowest concentration of SARS-CoV-2 viral copies this assay can detect is 138 copies/mL. A negative result does not preclude SARS-Cov-2 infection and should not be used as the sole basis for treatment or other patient management decisions. A negative result may occur with  improper specimen collection/handling, submission of specimen other than nasopharyngeal swab, presence of viral mutation(s) within the areas targeted by this assay, and inadequate number of viral copies(<138 copies/mL). A negative result must be combined with clinical observations, patient history, and epidemiological information. The expected result is Negative.  Fact Sheet for Patients:  EntrepreneurPulse.com.au  Fact Sheet for Healthcare  Providers:  IncredibleEmployment.be  This test is no t yet approved or cleared by the [place] FDA and  has been authorized for detection and/or diagnosis of SARS-CoV-2 by FDA under an Emergency Use Authorization (EUA). This EUA will remain  in effect (meaning this test can be used) for the duration of the COVID-19 declaration under Section 564(b)(1) of the Act, 21 U.S.C.section 360bbb-3(b)(1), unless the authorization is terminated  or revoked sooner.       Influenza A by PCR NEGATIVE NEGATIVE Final   Influenza B by PCR NEGATIVE NEGATIVE Final    Comment: (NOTE) The Xpert Xpress SARS-CoV-2/FLU/RSV plus assay is intended as an aid in the diagnosis of influenza from Nasopharyngeal swab specimens and should not be used as a sole basis for treatment. Nasal washings and aspirates are unacceptable for Xpert Xpress SARS-CoV-2/FLU/RSV testing.  Fact Sheet for Patients: EntrepreneurPulse.com.au  Fact Sheet for Healthcare Providers: IncredibleEmployment.be  This test is not yet approved or cleared by the [place] FDA and has been authorized for detection and/or diagnosis of SARS-CoV-2 by FDA under an Emergency Use Authorization (EUA). This EUA will remain in effect (meaning this test can be used) for the duration of the COVID-19 declaration under Section 564(b)(1) of the Act, 21 U.S.C. section 360bbb-3(b)(1), unless the authorization is terminated or revoked.  Performed at [facility], [place] [address], [place], [place] [address]   Culture, blood (routine x 2)     Status: None (Preliminary result)   Collection Time: 11/30/20  3:19 AM   Specimen: BLOOD LEFT ARM  Result Value Ref Range Status   Specimen Description BLOOD LEFT ARM  Final   Special Requests   Final    BOTTLES DRAWN AEROBIC AND ANAEROBIC Blood Culture results may not be optimal due to an excessive volume of blood received in culture bottles   Culture    Final    NO GROWTH 4 DAYS Performed at [facility], [place] [address], [place], [place] [address]    Report Status PENDING  Incomplete  Culture, blood (routine x 2)     Status: None (Preliminary result)   Collection Time: 11/30/20  3:19 AM   Specimen: BLOOD  Result Value Ref Range Status   Specimen Description BLOOD RIGHT ANTECUBITAL  Final   Special Requests   Final    BOTTLES DRAWN AEROBIC AND ANAEROBIC Blood Culture results may not  be optimal due to an excessive volume of blood received in culture bottles   Culture   Final    NO GROWTH 4 DAYS Performed at [facility], [place] [address], [place], [place] [address]    Report Status PENDING  Incomplete         Radiology Studies: US BIOPSY (KIDNEY)  Result Date: 12/03/2020 INDICATION: 40 year old female with history of SLE and CKD presenting with AK I. EXAM: ULTRASOUND GUIDED RENAL BIOPSY COMPARISON:  11/30/2020 MEDICATIONS: None. ANESTHESIA/SEDATION: Fentanyl 100 mcg IV; Versed 2 mg IV Total Moderate Sedation time: 15 minutes; The patient was continuously monitored during the procedure by the interventional radiology nurse under my direct supervision. COMPLICATIONS: None immediate. PROCEDURE: Informed written consent was obtained from the patient after a discussion of the risks, benefits and alternatives to treatment. The patient understands and consents the procedure. A timeout was performed prior to the initiation of the procedure. Ultrasound scanning was performed of the bilateral flanks. The inferior pole of the left kidney was selected for biopsy due to location and sonographic window. The procedure was planned. The operative site was prepped and draped in the usual sterile fashion. The overlying soft tissues were anesthetized with 1% lidocaine with epinephrine. A 16 gauge core needle biopsy device was advanced into the inferior cortex of the left kidney and 2 core biopsies were obtained under direct ultrasound guidance. Images  were saved for documentation purposes. The biopsy device was removed and hemostasis was obtained with manual compression. Post procedural scanning was negative for significant post procedural hemorrhage or additional complication. A dressing was placed. The patient tolerated the procedure well without immediate post procedural complication. IMPRESSION: Technically successful ultrasound guided left renal biopsy. Ruthann Cancer, MD [facility] Electronically Signed   By: Ruthann Cancer MD   On: 12/03/2020 16:23        Scheduled Meds: . sodium chloride   Intravenous Once  . amLODipine  10 mg Oral Daily  . Chlorhexidine Gluconate Cloth  6 each Topical Q0600  . cholecalciferol  1,000 Units Oral Daily  . heparin sodium (porcine)      . hydrocortisone cream   Topical BID  . labetalol  400 mg Oral BID  . pantoprazole  40 mg Oral Daily  . polyethylene glycol  17 g Oral BID  . predniSONE  40 mg Oral Q breakfast   Continuous Infusions: . sodium chloride    . sodium chloride Stopped (12/03/20 0650)  . azithromycin 500 mg (12/03/20 2332)  . cefTRIAXone (ROCEPHIN)  IV 1 g (12/03/20 2213)  . furosemide 120 mg (12/04/20 0652)     LOS: 5 days    Time spent: 35 minutes.     Elmarie Shiley, MD [facility]   If 7PM-7AM, please contact night-coverage [email or web address]  12/04/2020, 12:11 PM

## 2020-12-05 DIAGNOSIS — E875 Hyperkalemia: Secondary | ICD-10-CM

## 2020-12-05 DIAGNOSIS — E8770 Fluid overload, unspecified: Secondary | ICD-10-CM

## 2020-12-05 DIAGNOSIS — N179 Acute kidney failure, unspecified: Secondary | ICD-10-CM | POA: Diagnosis not present

## 2020-12-05 LAB — CBC
HCT: 21.2 % — ABNORMAL LOW (ref 36.0–46.0)
Hemoglobin: 7.5 g/dL — ABNORMAL LOW (ref 12.0–15.0)
MCH: 30.1 pg (ref 26.0–34.0)
MCHC: 35.4 g/dL (ref 30.0–36.0)
MCV: 85.1 fL (ref 80.0–100.0)
Platelets: 108 10*3/uL — ABNORMAL LOW (ref 150–400)
RBC: 2.49 MIL/uL — ABNORMAL LOW (ref 3.87–5.11)
RDW: 15.2 % (ref 11.5–15.5)
WBC: 6.9 10*3/uL (ref 4.0–10.5)
nRBC: 0 % (ref 0.0–0.2)

## 2020-12-05 LAB — CULTURE, BLOOD (ROUTINE X 2)
Culture: NO GROWTH
Culture: NO GROWTH

## 2020-12-05 LAB — RENAL FUNCTION PANEL
Albumin: 1.8 g/dL — ABNORMAL LOW (ref 3.5–5.0)
Anion gap: 13 (ref 5–15)
BUN: 86 mg/dL — ABNORMAL HIGH (ref 6–20)
CO2: 21 mmol/L — ABNORMAL LOW (ref 22–32)
Calcium: 6.7 mg/dL — ABNORMAL LOW (ref 8.9–10.3)
Chloride: 103 mmol/L (ref 98–111)
Creatinine, Ser: 4.95 mg/dL — ABNORMAL HIGH (ref 0.44–1.00)
GFR, Estimated: 11 mL/min — ABNORMAL LOW (ref >60)
Glucose, Bld: 113 mg/dL — ABNORMAL HIGH (ref 70–99)
Phosphorus: 7.7 mg/dL — ABNORMAL HIGH (ref 2.5–4.6)
Potassium: 4.4 mmol/L (ref 3.5–5.1)
Sodium: 137 mmol/L (ref 135–145)

## 2020-12-05 LAB — GLUCOSE 6 PHOSPHATE DEHYDROGENASE
G6PDH: 9.4 U/g{Hb} (ref 4.7–14.6)
Hemoglobin: 6.6 g/dL — ABNORMAL LOW (ref 11.1–15.9)

## 2020-12-05 MED ORDER — HEPARIN SODIUM (PORCINE) 1000 UNIT/ML IJ SOLN
INTRAMUSCULAR | Status: AC
  Filled 2020-12-05: qty 4

## 2020-12-05 NOTE — Progress Notes (Signed)
PROGRESS NOTE    Cathy [name]  ZOX:[id number] DOB: 07-Dec-1980 DOA: 11/29/2020 PCP: Patient, No Pcp Per   Brief Narrative: 40 year old with past medical history significant for SLE, CKD stage III AAA/lupus nephritis, hypertension, asthma, substance abuse, bipolar disorder, depression, history of medication noncompliance presented to the ED with shortness of breath worsening edema. -Admitted with anasarca and AKI on CKD 3, Nephrotic syndrome secondary to Lupus. -Nephrology following, started on HD. Underwent Kidney Bx 1/03.   Assessment & Plan:  AKI on CKD stage IIIa lupus/lupus nephritis: Metabolic acidosis -Baseline creatinine in the 2.5-3 range, on admission creatinine was 6.5 with hyperkalemia -Nephrology following, underwent Tunneled HD catheter placement on 12-31, first HD treatment 1/1 -Also treated with high-dose IV Solu-Medrol now on prednisone -Underwent kidney biopsy 1/3 -Per renal, remains profoundly fluid overloaded, on IV Lasix and plan for HD today  Hyperkalemia: On admission received bicarbonate, Lokelma, calcium gluconate. Normalized.   Hypertensive urgency: Continue with amlodipine and labetalol.   IV lasix.   Acute on chronic anemia -Anemia panel suggestive of chronic disease -Hemoglobin down to 7.5, continue EPO  Lupus: Was  On prednisone taper out patient.  Received high-dose Solu-Medrol, for 3 days.  Currently on prednisone. Needs Follow-up with rheumatology.  Thrombocytopenia:  -Could be from SLE, monitor  Doubt community-acquired pneumonia: -Treated with 5 days of antibiotics, now completed  Pericardial effusion: Recent echo 12/16 revealed moderate pericardial effusion. No evidence of Tamponade hypotension tachycardia  Epigastric pain: Started  PPI   Estimated body mass index is 34.18 kg/m as calculated from the following:   Height as of this encounter: 5\' 7"  (1.702 m).   Weight as of this encounter: 99 kg.   DVT prophylaxis: SCDs  due to thrombocytopenia Code Status: Full code Family Communication: Discussed patient in detail, no family at bedside Disposition Plan:  Status is: Inpatient  Remains inpatient appropriate because:IV treatments appropriate due to intensity of illness or inability to take PO   Dispo: The patient is from: Home              Anticipated d/c is to: Home              Anticipated d/c date is: >3 days              Patient currently is not medically stable to d/c.  Consultants:   Nephrology  IR  Procedures:   HD catheter   Kidney biopsy 1/3  Antimicrobials:    Subjective: Continues to feel weak, some anorexia, abdominal wall tightness and some dyspnea  Objective: Vitals:   12/04/20 2347 12/05/20 0100 12/05/20 0626 12/05/20 0938  BP: (!) 175/110 (!) 175/109 (!) 175/115 (!) 177/122  Pulse: 68 65 66 68  Resp:   18 19  Temp:   98.1 F (36.7 C) 98.8 F (37.1 C)  TempSrc:   Oral Oral  SpO2:   98% 100%  Weight:      Height:        Intake/Output Summary (Last 24 hours) at 12/05/2020 1044 Last data filed at 12/05/2020 0950 Gross per 24 hour  Intake 735 ml  Output 800 ml  Net -65 ml   Filed Weights   11/30/20 0704  Weight: 99 kg    Examination:  General exam: Chronically ill-appearing pleasant female laying in bed, AAOx3 HEENT: Positive JVD CVS: S1-S2, regular rate rhythm Lungs: Decreased breath sounds both bases Abdomen: Soft, nontender, bowel sounds present, abdominal wall edema Extremities: 2-3+ edema Skin: No rashes on exposed skin Psych:  Flat affect   Data Reviewed: I have personally reviewed following labs and imaging studies  CBC: Recent Labs  Lab 12/02/20 0447 12/03/20 0156 12/04/20 0701 12/04/20 1946 12/05/20 0105  WBC 6.8 10.0 8.0 8.1 6.9  HGB 7.4* 7.3* 6.7* 8.5* 7.5*  HCT 21.2* 21.2* 19.6* 24.3* 21.2*  MCV 85.8 86.2 86.7 85.6 85.1  PLT 123* 122* PLATELET CLUMPS NOTED ON SMEAR, COUNT APPEARS DECREASED 121* 341*   Basic Metabolic  Panel: Recent Labs  Lab 12/01/20 0247 12/01/20 1406 12/02/20 0447 12/03/20 0156 12/04/20 0701 12/05/20 0105  NA 139 136 137 137 137 137  K 5.7* 5.2* 5.2* 4.9 3.5 4.4  CL 106 105 105 104 105 103  CO2 19* 17* 20* 17* 21* 21*  GLUCOSE 122* 167* 140* 142* 129* 113*  BUN 123* 96* 101* 113* 73* 86*  CREATININE 6.74* 6.09* 6.19* 6.38* 4.45* 4.95*  CALCIUM 6.8* 6.8* 6.5* 6.6* 6.7* 6.7*  PHOS 9.2*  --  8.6* 9.1* 6.8* 7.7*   GFR: Estimated Creatinine Clearance: 18.8 mL/min (A) (by C-G formula based on SCr of 4.95 mg/dL (H)). Liver Function Tests: Recent Labs  Lab 12/01/20 0247 12/02/20 0447 12/03/20 0156 12/04/20 0701 12/05/20 0105  ALBUMIN 1.9* 2.0* 2.0* 2.0* 1.8*   No results for input(s): LIPASE, AMYLASE in the last 168 hours. No results for input(s): AMMONIA in the last 168 hours. Coagulation Profile: Recent Labs  Lab 12/03/20 0156  INR 1.0   Cardiac Enzymes: No results for input(s): CKTOTAL, CKMB, CKMBINDEX, TROPONINI in the last 168 hours. BNP (last 3 results) No results for input(s): PROBNP in the last 8760 hours. HbA1C: No results for input(s): HGBA1C in the last 72 hours. CBG: Recent Labs  Lab 11/29/20 1943  GLUCAP 83   Lipid Profile: No results for input(s): CHOL, HDL, LDLCALC, TRIG, CHOLHDL, LDLDIRECT in the last 72 hours. Thyroid Function Tests: No results for input(s): TSH, T4TOTAL, FREET4, T3FREE, THYROIDAB in the last 72 hours. Anemia Panel: Recent Labs    12/04/20 0937  VITAMINB12 189  FOLATE 5.7*  FERRITIN 617*  TIBC 221*  IRON 73  RETICCTPCT 2.8   Sepsis Labs: Recent Labs  Lab 11/29/20 2058  PROCALCITON 0.64    Recent Results (from the past 240 hour(s))  Resp Panel by RT-PCR (Flu A&B, Covid) Nasopharyngeal Swab     Status: None   Collection Time: 11/29/20  7:24 PM   Specimen: Nasopharyngeal Swab; Nasopharyngeal(NP) swabs in vial transport medium  Result Value Ref Range Status   SARS Coronavirus 2 by RT PCR NEGATIVE NEGATIVE Final     Comment: (NOTE) SARS-CoV-2 target nucleic acids are NOT DETECTED.  The SARS-CoV-2 RNA is generally detectable in upper respiratory specimens during the acute phase of infection. The lowest concentration of SARS-CoV-2 viral copies this assay can detect is 138 copies/mL. A negative result does not preclude SARS-Cov-2 infection and should not be used as the sole basis for treatment or other patient management decisions. A negative result may occur with  improper specimen collection/handling, submission of specimen other than nasopharyngeal swab, presence of viral mutation(s) within the areas targeted by this assay, and inadequate number of viral copies(<138 copies/mL). A negative result must be combined with clinical observations, patient history, and epidemiological information. The expected result is Negative.  Fact Sheet for Patients:  EntrepreneurPulse.com.au  Fact Sheet for Healthcare Providers:  IncredibleEmployment.be  This test is no t yet approved or cleared by the [place] FDA and  has been authorized for detection and/or diagnosis of SARS-CoV-2 by  FDA under an Emergency Use Authorization (EUA). This EUA will remain  in effect (meaning this test can be used) for the duration of the COVID-19 declaration under Section 564(b)(1) of the Act, 21 U.S.C.section 360bbb-3(b)(1), unless the authorization is terminated  or revoked sooner.       Influenza A by PCR NEGATIVE NEGATIVE Final   Influenza B by PCR NEGATIVE NEGATIVE Final    Comment: (NOTE) The Xpert Xpress SARS-CoV-2/FLU/RSV plus assay is intended as an aid in the diagnosis of influenza from Nasopharyngeal swab specimens and should not be used as a sole basis for treatment. Nasal washings and aspirates are unacceptable for Xpert Xpress SARS-CoV-2/FLU/RSV testing.  Fact Sheet for Patients: EntrepreneurPulse.com.au  Fact Sheet for Healthcare  Providers: IncredibleEmployment.be  This test is not yet approved or cleared by the [place] FDA and has been authorized for detection and/or diagnosis of SARS-CoV-2 by FDA under an Emergency Use Authorization (EUA). This EUA will remain in effect (meaning this test can be used) for the duration of the COVID-19 declaration under Section 564(b)(1) of the Act, 21 U.S.C. section 360bbb-3(b)(1), unless the authorization is terminated or revoked.  Performed at [facility], [place] [address] [place], [place] [address]   Culture, blood (routine x 2)     Status: None   Collection Time: 11/30/20  3:19 AM   Specimen: BLOOD LEFT ARM  Result Value Ref Range Status   Specimen Description BLOOD LEFT ARM  Final   Special Requests   Final    BOTTLES DRAWN AEROBIC AND ANAEROBIC Blood Culture results may not be optimal due to an excessive volume of blood received in culture bottles   Culture   Final    NO GROWTH 5 DAYS Performed at [facility], [place] [address], [place], [place] [address]    Report Status 12/05/2020 FINAL  Final  Culture, blood (routine x 2)     Status: None   Collection Time: 11/30/20  3:19 AM   Specimen: BLOOD  Result Value Ref Range Status   Specimen Description BLOOD RIGHT ANTECUBITAL  Final   Special Requests   Final    BOTTLES DRAWN AEROBIC AND ANAEROBIC Blood Culture results may not be optimal due to an excessive volume of blood received in culture bottles   Culture   Final    NO GROWTH 5 DAYS Performed at [facility], [place] [address], [place], [place] [address]    Report Status 12/05/2020 FINAL  Final         Radiology Studies: US BIOPSY (KIDNEY)  Result Date: 12/03/2020 INDICATION: 39 year old female with history of SLE and CKD presenting with AK I. EXAM: ULTRASOUND GUIDED RENAL BIOPSY COMPARISON:  11/30/2020 MEDICATIONS: None. ANESTHESIA/SEDATION: Fentanyl 100 mcg IV; Versed 2 mg IV Total Moderate Sedation time: 15 minutes;  The patient was continuously monitored during the procedure by the interventional radiology nurse under my direct supervision. COMPLICATIONS: None immediate. PROCEDURE: Informed written consent was obtained from the patient after a discussion of the risks, benefits and alternatives to treatment. The patient understands and consents the procedure. A timeout was performed prior to the initiation of the procedure. Ultrasound scanning was performed of the bilateral flanks. The inferior pole of the left kidney was selected for biopsy due to location and sonographic window. The procedure was planned. The operative site was prepped and draped in the usual sterile fashion. The overlying soft tissues were anesthetized with 1% lidocaine with epinephrine. A 16 gauge core needle biopsy device  was advanced into the inferior cortex of the left kidney and 2 core biopsies were obtained under direct ultrasound guidance. Images were saved for documentation purposes. The biopsy device was removed and hemostasis was obtained with manual compression. Post procedural scanning was negative for significant post procedural hemorrhage or additional complication. A dressing was placed. The patient tolerated the procedure well without immediate post procedural complication. IMPRESSION: Technically successful ultrasound guided left renal biopsy. Ruthann Cancer, MD [facility] Radiology Electronically Signed   By: Ruthann Cancer MD   On: 12/03/2020 16:23        Scheduled Meds: . amLODipine  10 mg Oral Daily  . Chlorhexidine Gluconate Cloth  6 each Topical Q0600  . cholecalciferol  1,000 Units Oral Daily  . folic acid  1 mg Oral Daily  . hydrocortisone cream   Topical BID  . labetalol  400 mg Oral BID  . pantoprazole  40 mg Oral Daily  . polyethylene glycol  17 g Oral BID  . predniSONE  40 mg Oral Q breakfast  . vitamin B-12  1,000 mcg Oral Daily   Continuous Infusions: . sodium  chloride    . sodium chloride Stopped (12/03/20 0650)  . furosemide 120 mg (12/05/20 0521)     LOS: 6 days    Time spent: 25 minutes.     Domenic Polite, MD [facility]  12/05/2020, 10:44 AM

## 2020-12-05 NOTE — Progress Notes (Signed)
[facility] Progress Note    Assessment/ Plan:   Assessment/Recommendations: Cathy [name] is a/an 40 y.o. female with a past medical history significant for SLE, HTN, CKD, admitted for volume overload, hyperkalemia, renal failure, presumptive lupus nephritis.     Non-oliguric AKI, severe: Likely secondary to lupus nephritis but other factors contributing.  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 Specimen handling), incubation performed - HD #1 1/1, HD #2 12/04/20, HD #3 12/05/20 and next HD planned for 12/07/20 - IV Lasix BID as well - biopsy 12/03/20 PM, awaiting results  Hyperkalemia: 2/2 AKI. Multiple medications given. Overall improved. Continue lasix and dialysis as above  Volume overload: likely 2/2 ARF and nephrotic syndrome -Dialysis as above -Lasix 120mg  IV BID for now  SLE: known lupus but no kidney biopsy due to compliance issues in the past. Labs on 11/07/20 ANA+, Anti-dsDNA 31, +anti-centromere, chromatin, smith, ro, la. Plan for kidney biopsy, steroids, and further treatment based on results.  Hypertension: likely will improve w/ volume optimization. Steroids contributing -continue norvasc 10mg  daily, labetalol 400mg  BID -Lasix IV BID as above  Acidosis: 2/2 aki. Improved  PNA?: procalcitonin elevated. abx per primary  Anemia due to CKD  -Transfuse for Hgb<7 g/dL; ctm - anemia workup drawn 12/04/20 - PM hgb acceptable 12/04/20  Subjective:     Seen on HD.  Initially was declining dialysis but after RN educator was able to meet with her she decided to undergo treatment today.  She is very nervous about needing dialysis long-term.  Called [facility] nephropath this AM- biopsy not received yet.     Objective:   BP (!) 160/110  (BP Location: Right Arm)   Pulse 68   Temp 98.7 F (37.1 C) (Oral)   Resp 19   Ht 5\' 7"  (1.702 m)   Wt 95.6 kg   SpO2 100%   BMI 33.01 kg/m   Intake/Output Summary (Last 24 hours) at 12/05/2020 1948 Last data filed at 12/05/2020 1656 Gross per 24 hour  Intake 277 ml  Output 3500 ml  Net -3223 ml   Weight change:   Physical Exam: Gen: NAD, lying in bed CVS: RRR Resp: clear Abd: soft Ext:3+ LE edema/ dependent and flank edema and anasarca ACCESS: R IJ TDC  Imaging: No results found.  Labs: BMET Recent Labs  Lab 11/30/20 0319 11/30/20 1047 11/30/20 1506 11/30/20 1846 12/01/20 0247 12/01/20 1406 12/02/20 0447 12/03/20 0156 12/04/20 0701 12/05/20 0105  NA 139 137  --   --  139 136 137 137 137 137  K 6.3* 6.1*   < > 5.8* 5.7* 5.2* 5.2* 4.9 3.5 4.4  CL 109 105  --   --  106 105 105 104 105 103  CO2 19* 17*  --   --  19* 17* 20* 17* 21* 21*  GLUCOSE 111* 125*  --   --  122* 167* 140* 142* 129* 113*  BUN 117* 114*  --   --  123* 96* 101* 113* 73* 86*  CREATININE 6.26* 6.53*  --   --  6.74* 6.09* 6.19* 6.38* 4.45* 4.95*  CALCIUM 7.2* 7.1*  --   --  6.8* 6.8* 6.5* 6.6* 6.7* 6.7*  PHOS 8.6*  --   --   --  9.2*  --  8.6* 9.1* 6.8* 7.7*   < > = values in this interval not displayed.   CBC Recent Labs  Lab 12/03/20 0156 12/04/20 0701 12/04/20 1158 12/04/20 1946 12/05/20 0105  WBC 10.0 8.0  --  8.1 6.9  HGB 7.3* 6.7* 6.6* 8.5* 7.5*  HCT 21.2* 19.6*  --  24.3* 21.2*  MCV 86.2 86.7  --  85.6 85.1  PLT 122* PLATELET CLUMPS NOTED ON SMEAR, COUNT APPEARS DECREASED  --  121* 108*    Medications:    . amLODipine  10 mg Oral Daily  . Chlorhexidine Gluconate Cloth  6 each Topical Q0600  . cholecalciferol  1,000 Units Oral Daily  . folic acid  1 mg Oral Daily  . heparin sodium (porcine)      . hydrocortisone cream   Topical BID  . labetalol  400 mg Oral BID  . pantoprazole  40 mg Oral Daily  . polyethylene glycol  17 g Oral BID  . predniSONE  40 mg Oral Q breakfast   . vitamin B-12  1,000 mcg Oral Daily      Madelon Lips, MD 12/05/2020, 7:48 PM

## 2020-12-05 NOTE — Progress Notes (Signed)
Notified by primary RN, Angela Nevin, that patient was not wanting to go to her HD treatment today. Upon rounding on patient she reports that she thought that since her B/P dropped at her last treatment that she would be given a break. Educated patient on the process of serial dialysis treatments at initiation of treatment. Also discussed with patient the amount of fluid she is overloaded, with reference to her edema throughout. Patient reports understanding and agrees to go to treatment as ordered. Notified HD department. Will continue to follow as appropriate.  Mady Gemma Dialysis Nurse Coordinator [phone]

## 2020-12-06 DIAGNOSIS — E8770 Fluid overload, unspecified: Secondary | ICD-10-CM | POA: Diagnosis not present

## 2020-12-06 DIAGNOSIS — N179 Acute kidney failure, unspecified: Secondary | ICD-10-CM | POA: Diagnosis not present

## 2020-12-06 DIAGNOSIS — E875 Hyperkalemia: Secondary | ICD-10-CM | POA: Diagnosis not present

## 2020-12-06 LAB — BASIC METABOLIC PANEL
Anion gap: 10 (ref 5–15)
BUN: 53 mg/dL — ABNORMAL HIGH (ref 6–20)
CO2: 25 mmol/L (ref 22–32)
Calcium: 6.7 mg/dL — ABNORMAL LOW (ref 8.9–10.3)
Chloride: 102 mmol/L (ref 98–111)
Creatinine, Ser: 4.07 mg/dL — ABNORMAL HIGH (ref 0.44–1.00)
GFR, Estimated: 14 mL/min — ABNORMAL LOW (ref >60)
Glucose, Bld: 149 mg/dL — ABNORMAL HIGH (ref 70–99)
Potassium: 3.8 mmol/L (ref 3.5–5.1)
Sodium: 137 mmol/L (ref 135–145)

## 2020-12-06 LAB — COMPREHENSIVE METABOLIC PANEL
ALT: 32 U/L (ref 0–44)
AST: 31 U/L (ref 15–41)
Albumin: 1.9 g/dL — ABNORMAL LOW (ref 3.5–5.0)
Alkaline Phosphatase: 76 U/L (ref 38–126)
Anion gap: 12 (ref 5–15)
BUN: 59 mg/dL — ABNORMAL HIGH (ref 6–20)
CO2: 24 mmol/L (ref 22–32)
Calcium: 6.9 mg/dL — ABNORMAL LOW (ref 8.9–10.3)
Chloride: 101 mmol/L (ref 98–111)
Creatinine, Ser: 4.73 mg/dL — ABNORMAL HIGH (ref 0.44–1.00)
GFR, Estimated: 12 mL/min — ABNORMAL LOW (ref >60)
Glucose, Bld: 179 mg/dL — ABNORMAL HIGH (ref 70–99)
Potassium: 4 mmol/L (ref 3.5–5.1)
Sodium: 137 mmol/L (ref 135–145)
Total Bilirubin: 0.6 mg/dL (ref 0.3–1.2)
Total Protein: 4.6 g/dL — ABNORMAL LOW (ref 6.5–8.1)

## 2020-12-06 LAB — CBC
HCT: 21.5 % — ABNORMAL LOW (ref 36.0–46.0)
Hemoglobin: 7.2 g/dL — ABNORMAL LOW (ref 12.0–15.0)
MCH: 29.1 pg (ref 26.0–34.0)
MCHC: 33.5 g/dL (ref 30.0–36.0)
MCV: 87 fL (ref 80.0–100.0)
Platelets: 77 10*3/uL — ABNORMAL LOW (ref 150–400)
RBC: 2.47 MIL/uL — ABNORMAL LOW (ref 3.87–5.11)
RDW: 15.2 % (ref 11.5–15.5)
WBC: 6.6 10*3/uL (ref 4.0–10.5)
nRBC: 0 % (ref 0.0–0.2)

## 2020-12-06 MED ORDER — SODIUM CHLORIDE 0.9 % IV SOLN
500.0000 mg | Freq: Once | INTRAVENOUS | Status: AC
  Administered 2020-12-07: 500 mg via INTRAVENOUS
  Filled 2020-12-06: qty 25

## 2020-12-06 MED ORDER — METHYLPREDNISOLONE SODIUM SUCC 125 MG IJ SOLR
125.0000 mg | Freq: Once | INTRAMUSCULAR | Status: AC
  Administered 2020-12-07: 125 mg via INTRAVENOUS
  Filled 2020-12-06: qty 2

## 2020-12-06 MED ORDER — DARBEPOETIN ALFA 100 MCG/0.5ML IJ SOSY
100.0000 ug | PREFILLED_SYRINGE | INTRAMUSCULAR | Status: DC
  Filled 2020-12-06 (×3): qty 0.5

## 2020-12-06 MED ORDER — ONDANSETRON HCL 4 MG PO TABS
4.0000 mg | ORAL_TABLET | Freq: Once | ORAL | Status: AC
  Administered 2020-12-07: 4 mg via ORAL
  Filled 2020-12-06: qty 1

## 2020-12-06 NOTE — Progress Notes (Signed)
Renal Navigator received call from Dr. Upton/Nephrologist notifying that patient needs referral for outpatient HD for AKI while receiving IV cytoxan infusions. She states she has had an in depth conversation with patient who agrees to infusions and outpatient HD. Patient states she has transportation. Navigator completed referral to [facility] Admissions for any clinic in [place] able to accommodate a new start at this time and will follow closely.   Alphonzo Cruise, [place] Renal Navigator [phone]

## 2020-12-06 NOTE — Progress Notes (Signed)
PROGRESS NOTE    Cathy [name]  MBT:[id number] DOB: 11-14-1981 DOA: 11/29/2020 PCP: Patient, No Pcp Per   Brief Narrative: 40 year old with past medical history significant for SLE, CKD stage III AAA/lupus nephritis, hypertension, asthma, substance abuse, bipolar disorder, depression, history of medication noncompliance presented to the ED with shortness of breath worsening edema. -Admitted with anasarca and AKI on CKD 3, Nephrotic syndrome secondary to Lupus. -Nephrology following, started on HD. Underwent Kidney Bx 1/03.   Assessment & Plan:  AKI on CKD stage IIIa lupus/lupus nephritis: Metabolic acidosis -Baseline creatinine in the 2.5-3 range, on admission creatinine was 6.5 with hyperkalemia -Nephrology following, underwent Tunneled HD catheter placement on 12-31, first HD treatment 1/1 -Also treated with high-dose IV Solu-Medrol now on prednisone -Underwent kidney biopsy 1/3 -Per renal, remains profoundly volume overloaded on  high-dose Lasix and dialysis, last HD yesterday -Check C met for albumin tomorrow  Hyperkalemia: On admission received bicarbonate, Lokelma, calcium gluconate. Normalized.   Hypertensive urgency: Continue with amlodipine and labetalol.   IV lasix.   Acute on chronic anemia -Anemia panel suggestive of chronic disease -Hemoglobin down to 7.2, continue EPO, suspect a component of hemodilution as well given profoundly volume overloaded state  Lupus: Was  On prednisone taper out patient.  Received high-dose Solu-Medrol, for 3 days.  Currently on prednisone. Needs Follow-up with rheumatology.  Thrombocytopenia:  -Could be from SLE, monitor -Heparin on hold  Doubt community-acquired pneumonia: -Treated with 5 days of antibiotics, now completed  Epigastric pain: Started  PPI   Estimated body mass index is 33.84 kg/m as calculated from the following:   Height as of this encounter: _0  (1.702 m).   Weight as of this encounter: 98  kg.   DVT prophylaxis: SCDs due to thrombocytopenia Code Status: Full code Family Communication: Discussed patient in detail, no family at bedside Disposition Plan:  Status is: Inpatient  Remains inpatient appropriate because:IV treatments appropriate due to intensity of illness or inability to take PO   Dispo: The patient is from: Home              Anticipated d/c is to: Home              Anticipated d/c date is: >3 days              Patient currently is not medically stable to d/c.  Consultants:   Nephrology  IR  Procedures:   HD catheter   Kidney biopsy 1/3  Antimicrobials:    Subjective: -Underwent dialysis yesterday, feels a little better, breathing improving, swelling slowly improving as well  Objective: Vitals:   12/05/20 1804 12/05/20 2025 12/06/20 0436 12/06/20 0914  BP: (!) 160/110 (!) 164/111 (!) 165/113 (!) 163/103  Pulse: 68 70 68 75  Resp: 19   17  Temp: 98.7 F (37.1 C) 98.9 F (37.2 C) 98.6 F (37 C)   TempSrc: Oral Oral Oral   SpO2: 100% 98% 96% 97%  Weight:  98 kg    Height:        Intake/Output Summary (Last 24 hours) at 12/06/2020 1141 Last data filed at 12/06/2020 0900 Gross per 24 hour  Intake 325 ml  Output 3500 ml  Net -3175 ml   Filed Weights   11/30/20 0704 12/05/20 1700 12/05/20 2025  Weight: 99 kg 95.6 kg 98 kg    Examination:  General exam: Chronically ill pleasant female laying in bed, AAOx3, no distress HEENT: Positive JVD CVS: S1-S2, regular rate rhythm Lungs: Decreased breath  sounds at both bases Abdomen: Soft, nontender, bowel sounds present, abdominal wall edema Extremities: 2-3+ edema  Skin: No rashes on exposed skin Psych: Flat affect   Data Reviewed: I have personally reviewed following labs and imaging studies  CBC: Recent Labs  Lab 12/03/20 0156 12/04/20 0701 12/04/20 1158 12/04/20 1946 12/05/20 0105 12/06/20 0257  WBC 10.0 8.0  --  8.1 6.9 6.6  HGB 7.3* 6.7* 6.6* 8.5* 7.5* 7.2*  HCT 21.2*  19.6*  --  24.3* 21.2* 21.5*  MCV 86.2 86.7  --  85.6 85.1 87.0  PLT 122* PLATELET CLUMPS NOTED ON SMEAR, COUNT APPEARS DECREASED  --  121* 108* 77*   Basic Metabolic Panel: Recent Labs  Lab 12/01/20 0247 12/01/20 1406 12/02/20 0447 12/03/20 0156 12/04/20 0701 12/05/20 0105 12/06/20 0257  NA 139   < > 137 137 137 137 137  K 5.7*   < > 5.2* 4.9 3.5 4.4 3.8  CL 106   < > 105 104 105 103 102  CO2 19*   < > 20* 17* 21* 21* 25  GLUCOSE 122*   < > 140* 142* 129* 113* 149*  BUN 123*   < > 101* 113* 73* 86* 53*  CREATININE 6.74*   < > 6.19* 6.38* 4.45* 4.95* 4.07*  CALCIUM 6.8*   < > 6.5* 6.6* 6.7* 6.7* 6.7*  PHOS 9.2*  --  8.6* 9.1* 6.8* 7.7*  --    < > = values in this interval not displayed.   GFR: Estimated Creatinine Clearance: 22.8 mL/min (A) (by C-G formula based on SCr of 4.07 mg/dL (H)). Liver Function Tests: Recent Labs  Lab 12/01/20 0247 12/02/20 0447 12/03/20 0156 12/04/20 0701 12/05/20 0105  ALBUMIN 1.9* 2.0* 2.0* 2.0* 1.8*   No results for input(s): LIPASE, AMYLASE in the last 168 hours. No results for input(s): AMMONIA in the last 168 hours. Coagulation Profile: Recent Labs  Lab 12/03/20 0156  INR 1.0   Cardiac Enzymes: No results for input(s): CKTOTAL, CKMB, CKMBINDEX, TROPONINI in the last 168 hours. BNP (last 3 results) No results for input(s): PROBNP in the last 8760 hours. HbA1C: No results for input(s): HGBA1C in the last 72 hours. CBG: Recent Labs  Lab 11/29/20 1943  GLUCAP 83   Lipid Profile: No results for input(s): CHOL, HDL, LDLCALC, TRIG, CHOLHDL, LDLDIRECT in the last 72 hours. Thyroid Function Tests: No results for input(s): TSH, T4TOTAL, FREET4, T3FREE, THYROIDAB in the last 72 hours. Anemia Panel: Recent Labs    12/04/20 0937  VITAMINB12 189  FOLATE 5.7*  FERRITIN 617*  TIBC 221*  IRON 73  RETICCTPCT 2.8   Sepsis Labs: Recent Labs  Lab 11/29/20 2058  PROCALCITON 0.64    Recent Results (from the past 240 hour(s))   Resp Panel by RT-PCR (Flu A&B, Covid) Nasopharyngeal Swab     Status: None   Collection Time: 11/29/20  7:24 PM   Specimen: Nasopharyngeal Swab; Nasopharyngeal(NP) swabs in vial transport medium  Result Value Ref Range Status   SARS Coronavirus 2 by RT PCR NEGATIVE NEGATIVE Final    Comment: (NOTE) SARS-CoV-2 target nucleic acids are NOT DETECTED.  The SARS-CoV-2 RNA is generally detectable in upper respiratory specimens during the acute phase of infection. The lowest concentration of SARS-CoV-2 viral copies this assay can detect is 138 copies/mL. A negative result does not preclude SARS-Cov-2 infection and should not be used as the sole basis for treatment or other patient management decisions. A negative result may occur with  improper specimen  collection/handling, submission of specimen other than nasopharyngeal swab, presence of viral mutation(s) within the areas targeted by this assay, and inadequate number of viral copies(<138 copies/mL). A negative result must be combined with clinical observations, patient history, and epidemiological information. The expected result is Negative.  Fact Sheet for Patients:  BloggerCourse.com  Fact Sheet for Healthcare Providers:  SeriousBroker.it  This test is no t yet approved or cleared by the [place] FDA and  has been authorized for detection and/or diagnosis of SARS-CoV-2 by FDA under an Emergency Use Authorization (EUA). This EUA will remain  in effect (meaning this test can be used) for the duration of the COVID-19 declaration under Section 564(b)(1) of the Act, 21 U.S.C.section 360bbb-3(b)(1), unless the authorization is terminated  or revoked sooner.       Influenza A by PCR NEGATIVE NEGATIVE Final   Influenza B by PCR NEGATIVE NEGATIVE Final    Comment: (NOTE) The Xpert Xpress SARS-CoV-2/FLU/RSV plus assay is intended as an aid in the diagnosis of influenza from  Nasopharyngeal swab specimens and should not be used as a sole basis for treatment. Nasal washings and aspirates are unacceptable for Xpert Xpress SARS-CoV-2/FLU/RSV testing.  Fact Sheet for Patients: BloggerCourse.com  Fact Sheet for Healthcare Providers: SeriousBroker.it  This test is not yet approved or cleared by the [place] FDA and has been authorized for detection and/or diagnosis of SARS-CoV-2 by FDA under an Emergency Use Authorization (EUA). This EUA will remain in effect (meaning this test can be used) for the duration of the COVID-19 declaration under Section 564(b)(1) of the Act, 21 U.S.C. section 360bbb-3(b)(1), unless the authorization is terminated or revoked.  Performed at [facility], [address], [place], [place] [address]   Culture, blood (routine x 2)     Status: None   Collection Time: 11/30/20  3:19 AM   Specimen: BLOOD LEFT ARM  Result Value Ref Range Status   Specimen Description BLOOD LEFT ARM  Final   Special Requests   Final    BOTTLES DRAWN AEROBIC AND ANAEROBIC Blood Culture results may not be optimal due to an excessive volume of blood received in culture bottles   Culture   Final    NO GROWTH 5 DAYS Performed at [facility], [address] [place], [place] [address]    Report Status 12/05/2020 FINAL  Final  Culture, blood (routine x 2)     Status: None   Collection Time: 11/30/20  3:19 AM   Specimen: BLOOD  Result Value Ref Range Status   Specimen Description BLOOD RIGHT ANTECUBITAL  Final   Special Requests   Final    BOTTLES DRAWN AEROBIC AND ANAEROBIC Blood Culture results may not be optimal due to an excessive volume of blood received in culture bottles   Culture   Final    NO GROWTH 5 DAYS Performed at [facility], [address] [place], [place] [address]    Report Status 12/05/2020 FINAL  Final    Scheduled Meds: . amLODipine  10 mg Oral Daily  .  Chlorhexidine Gluconate Cloth  6 each Topical Q0600  . cholecalciferol  1,000 Units Oral Daily  . folic acid  1 mg Oral Daily  . hydrocortisone cream   Topical BID  . labetalol  400 mg Oral BID  . pantoprazole  40 mg Oral Daily  . polyethylene glycol  17 g Oral BID  . predniSONE  40 mg Oral Q breakfast  . vitamin B-12  1,000 mcg Oral Daily   Continuous Infusions: . sodium chloride    . sodium chloride Stopped (12/03/20 0650)  . furosemide 120 mg (12/06/20 0535)     LOS: 7 days    Time spent: 25 minutes.   Domenic Polite, MD [facility]  12/06/2020, 11:41 AM

## 2020-12-06 NOTE — Evaluation (Signed)
Physical Therapy Evaluation Patient Details Name: Cathy [name] MRN: [id number] DOB: 04-Feb-1981 Today's Date: 12/06/2020   History of Present Illness  Pt is 40 yo female with PMH including systemic lupus erythematosus, HTN, and CKD,  She was admitted for volume overload, hyperkalemia, renal failure, and presumptive lupus nephritis.  Pt had HD on 1/1, 1/4, and 1/5, with plan for next HD 1/7.  Clinical Impression  Pt admitted with above diagnosis. Evaluation was limited by syncopal/decreased responsiveness episode with ambulation.  Pt initially requiring min guard for mobility but after 12' of ambulation reports dizzy and then became less responsive requiring a chair urgently.  HR and O2 sats stable, BP was elevated once sitting (has been her norm).  Initial standing BP was normal/elevated, unable to get BP in standing during episode.  Pt is normally very independent and lives at home with her young children.  Expect pt to progress well as lightheadedness improves.  See current recommendations for follow-up and DME below; however, may need reassessment as she is able to progress.  Pt currently with functional limitations due to the deficits listed below (see PT Problem List). Pt will benefit from skilled PT to increase their independence and safety with mobility to allow discharge to the venue listed below.       Follow Up Recommendations Supervision - Intermittent;Home health PT;Other (comment) (needs further assessment - eval limited by syncope)    Equipment Recommendations  Other (comment) (possible rollator - needs further assessment)    Recommendations for Other Services       Precautions / Restrictions Precautions Precautions: Fall Precaution Comments: Syncopal      Mobility  Bed Mobility Overal bed mobility: Independent                  Transfers Overall transfer level: Needs assistance Equipment used: None Transfers: Sit to/from [facility] Sit to  Stand: Min guard Stand pivot transfers: Min guard       General transfer comment: Min guard for safety/steadying.  Performed sit to stand x 6 throughout session and stand pivot x 4.  Ambulation/Gait Ambulation/Gait assistance: Min guard;Mod assist Gait Distance (Feet): 12 Feet Assistive device: None Gait Pattern/deviations: Step-through pattern;Drifts right/left Gait velocity: decreased   General Gait Details: Pt initially able to ambulate 12' with mild unsteadiness; however, then reported dizziness and became less responsive.  Yelled for nursing staff to bring chair and pt required mod A to get to chair safely. Pt became more alert after sitting a few seconds and was able to transfer back to bed and then to recliner. See vitals in general comments  Stairs            Wheelchair Mobility    Modified Rankin (Stroke Patients Only)       Balance Overall balance assessment: Needs assistance Sitting-balance support: No upper extremity supported Sitting balance-Leahy Scale: Normal     Standing balance support: No upper extremity supported Standing balance-Leahy Scale: Fair Standing balance comment: mild unsteadiness with gait                             Pertinent Vitals/Pain Pain Assessment: 0-10 Pain Score: 8  Pain Location: spine and hips Pain Descriptors / Indicators: Sharp;Shooting Pain Intervention(s): Limited activity within patient's tolerance;Monitored during session;Repositioned (Reports significantly better in recliner)    Home Living Family/patient expects to be discharged to:: Private residence Living Arrangements: Children (kids are 1, 42, and 68 yo) Available  Help at Discharge: Other (Comment) (only has her kids) Type of Home: Apartment Home Access: Level entry     Home Layout: One level Home Equipment: None      Prior Function Level of Independence: Independent         Comments: Reports completely independent with ADLs, IADLs,  driving, and community ambulation     Hand Dominance        Extremity/Trunk Assessment   Upper Extremity Assessment Upper Extremity Assessment: Overall WFL for tasks assessed    Lower Extremity Assessment Lower Extremity Assessment: Overall WFL for tasks assessed    Cervical / Trunk Assessment Cervical / Trunk Assessment: Normal  Communication   Communication: No difficulties  Cognition Arousal/Alertness: Awake/alert Behavior During Therapy: WFL for tasks assessed/performed Overall Cognitive Status: Within Functional Limits for tasks assessed                                        General Comments General comments (skin integrity, edema, etc.): Pt on RA and sats remained >96% throughout session.  HR stable.  Pt's BP has been high.  Supine earlier today was 163/103.  Upon sitting pt reports some dizziness that quickly resolved.  BP was 151/100 in sitting.  Pt then stood and BP 146/97 - reports mild lightheadedness that resolved.  Pt then ambulated 12', reports dizzy then became less responsive and had to urgently get chair to sit.  BP once sitting was 180/111, HR 90 bpm, O2 sats 96%. RN was present.  Once pt stabilized, she was able to transfer back to bed.  Felt pt would benefit from being OOB for decreased dizziness and back pain.  Able to find a recliner and transferred pt to recliner.  VSS , denied dizziness, and reports significant improvement in back pain.  Feet were elevated on recliner and 3 folded blankets.  Educated on elevating feet to assist with swelling also recommend ankle pumps and AROM of legs to assist with edema.  Pt with severe pitting edema from mid calf down on bil legs.    Exercises     Assessment/Plan    PT Assessment Patient needs continued PT services  PT Problem List Decreased strength;Decreased mobility;Other (comment);Decreased activity tolerance;Cardiopulmonary status limiting activity;Decreased balance;Decreased knowledge of use of  DME;Pain (edema management)       PT Treatment Interventions DME instruction;Therapeutic activities;Gait training;Therapeutic exercise;Patient/family education;Balance training;Functional mobility training    PT Goals (Current goals can be found in the Care Plan section)  Acute Rehab PT Goals Patient Stated Goal: return home to children PT Goal Formulation: With patient Time For Goal Achievement: 12/20/20 Potential to Achieve Goals: Good    Frequency Min 3X/week   Barriers to discharge Decreased caregiver support      Co-evaluation               AM-PAC PT "6 Clicks" Mobility  Outcome Measure Help needed turning from your back to your side while in a flat bed without using bedrails?: None Help needed moving from lying on your back to sitting on the side of a flat bed without using bedrails?: None Help needed moving to and from a bed to a chair (including a wheelchair)?: A Little Help needed standing up from a chair using your arms (e.g., wheelchair or bedside chair)?: A Little Help needed to walk in hospital room?: A Lot Help needed climbing 3-5 steps with a railing? :  A Lot 6 Click Score: 18    End of Session Equipment Utilized During Treatment: Gait belt Activity Tolerance: Patient tolerated treatment well Patient left: in chair;with chair alarm set;with call bell/phone within reach Nurse Communication: Mobility status PT Visit Diagnosis: Other abnormalities of gait and mobility (R26.89);Muscle weakness (generalized) (M62.81);Dizziness and giddiness (R42)    Time: 3383-2919 PT Time Calculation (min) (ACUTE ONLY): 37 min   Charges:   PT Evaluation $PT Eval Moderate Complexity: 1 Mod PT Treatments $Therapeutic Activity: 8-22 mins        Abran Richard, PT Acute Rehab Services Pager [phone] [facility] [place] 12/06/2020, 3:47 PM

## 2020-12-06 NOTE — Plan of Care (Signed)
  Problem: Education: Goal: Knowledge of disease and its progression will improve Outcome: Progressing   Problem: Health Behavior/Discharge Planning: Goal: Ability to manage health-related needs will improve Outcome: Progressing   

## 2020-12-06 NOTE — Progress Notes (Addendum)
[facility] Progress Note    Assessment/ Plan:   Assessment/Recommendations: Cathy [name] is a/an 40 y.o. female with a past medical history significant for SLE, HTN, CKD, admitted for volume overload, hyperkalemia, renal failure, presumptive lupus nephritis.     Non-oliguric AKI, severe: Likely secondary to lupus nephritis but other factors contributing.  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 Specimen handling), incubation performed - HD #1 1/1, HD #2 12/04/20, HD #3 12/05/20 and next HD planned for 12/07/20 - IV Lasix BID as well - biopsy 12/03/20 PM, awaiting results still Addendum: biopsy result returns, d/w [facility] pathology.  Has Diffuse proliferative LN with 70% fibrocellular crescents.  This is consistent with Class IV LN.  Discussed with the patient treatment options- discussed Eurolupus protocol vs oral cytoxan.  I told her that I favored Eurolupus since the total cumulative cytoxan dose was lower.  We discussed the importance of going to all infusions.  Eurolupus protocol is 500 mg IV cytoxan every 2 weeks for 6 doses.  Risks/ benefits/ side effects of the medication were discussed.  Pt is ready to proceed.  She has all appropriate HIV/ Hepatitis panel drawn, Quant gold was indeterminate but this is likely d/t improper specimen handling.  Will reorder for completeness and will reorder LFTs.  Will do UPT too (she has a BTL for birth control).   I will order her initial dose of cytoxan for tomorrow, to be given after her dialysis.  D/w [place], appreciate assistance.  - last biopsy in 2019  Hyperkalemia: 2/2 AKI. Multiple medications given. Overall improved. Continue lasix and dialysis as above  Volume overload:  likely 2/2 ARF and nephrotic syndrome -Dialysis as above -Lasix 120mg  IV BID for now  SLE: known lupus but no kidney biopsy due to compliance issues in the past. Labs on 11/07/20 ANA+, Anti-dsDNA 31, +anti-centromere, chromatin, smith, ro, la. Plan for kidney biopsy, steroids, and further treatment based on results.  Hypertension: likely will improve w/ volume optimization. Steroids contributing -continue norvasc 10mg  daily, labetalol 400mg  BID -Lasix IV BID as above  Acidosis: 2/2 aki. Improved  PNA?: procalcitonin elevated. abx per primary  Anemia due to CKD  -Transfuse for Hgb<7 g/dL; ctm - anemia workup drawn 12/04/20 - PM hgb acceptable 12/04/20 - start aranesp on HD for 12/07/20  Subjective:     Seen in room.  Did well with HD yesterday.  Still awaiting biopsy result.       Objective:   BP (!) 163/103 (BP Location: Left Arm)   Pulse 75   Temp 98.6 F (37 C) (Oral)   Resp 17   Ht 5\' 7"  (1.702 m)   Wt 98 kg   SpO2 97%   BMI 33.84 kg/m   Intake/Output Summary (Last 24 hours) at 12/06/2020 1251 Last data filed at 12/06/2020 0900 Gross per 24 hour  Intake 325 ml  Output 3500 ml  Net -3175 ml   Weight change:   Physical Exam: Gen: NAD, lying in bed CVS: RRR Resp: clear Abd: soft Ext:3+ LE edema/ dependent and flank edema and anasarca, slightly improved ACCESS: R IJ TDC  Imaging: No results found.  Labs: BMET Recent Labs  Lab 11/30/20 0319 11/30/20 1047  12/01/20 0247 12/01/20 1406 12/02/20 0447 12/03/20 0156 12/04/20 0701 12/05/20 0105 12/06/20 0257  NA 139   < > 139 136 137 137 137 137 137  K 6.3*   < > 5.7* 5.2* 5.2* 4.9 3.5 4.4 3.8  CL 109   < > 106 105 105 104 105 103 102  CO2 19*   < > 19* 17* 20* 17* 21* 21* 25  GLUCOSE 111*   < > 122* 167* 140* 142* 129* 113* 149*  BUN 117*   < > 123* 96* 101* 113* 73* 86* 53*  CREATININE 6.26*   < > 6.74* 6.09* 6.19* 6.38* 4.45* 4.95* 4.07*  CALCIUM 7.2*   < > 6.8* 6.8* 6.5* 6.6* 6.7* 6.7* 6.7*  PHOS 8.6*   --  9.2*  --  8.6* 9.1* 6.8* 7.7*  --    < > = values in this interval not displayed.   CBC Recent Labs  Lab 12/04/20 0701 12/04/20 1158 12/04/20 1946 12/05/20 0105 12/06/20 0257  WBC 8.0  --  8.1 6.9 6.6  HGB 6.7* 6.6* 8.5* 7.5* 7.2*  HCT 19.6*  --  24.3* 21.2* 21.5*  MCV 86.7  --  85.6 85.1 87.0  PLT PLATELET CLUMPS NOTED ON SMEAR, COUNT APPEARS DECREASED  --  121* 108* 77*    Medications:    . amLODipine  10 mg Oral Daily  . Chlorhexidine Gluconate Cloth  6 each Topical Q0600  . cholecalciferol  1,000 Units Oral Daily  . [START ON 12/07/2020] darbepoetin (ARANESP) injection - DIALYSIS  100 mcg Intravenous Q Fri-HD  . folic acid  1 mg Oral Daily  . hydrocortisone cream   Topical BID  . labetalol  400 mg Oral BID  . pantoprazole  40 mg Oral Daily  . polyethylene glycol  17 g Oral BID  . predniSONE  40 mg Oral Q breakfast  . vitamin B-12  1,000 mcg Oral Daily      Madelon Lips, MD 12/06/2020, 12:51 PM

## 2020-12-07 DIAGNOSIS — N179 Acute kidney failure, unspecified: Secondary | ICD-10-CM | POA: Diagnosis not present

## 2020-12-07 DIAGNOSIS — D631 Anemia in chronic kidney disease: Secondary | ICD-10-CM | POA: Diagnosis not present

## 2020-12-07 DIAGNOSIS — N189 Chronic kidney disease, unspecified: Secondary | ICD-10-CM | POA: Diagnosis not present

## 2020-12-07 DIAGNOSIS — E8770 Fluid overload, unspecified: Secondary | ICD-10-CM | POA: Diagnosis not present

## 2020-12-07 LAB — COMPREHENSIVE METABOLIC PANEL
ALT: 25 U/L (ref 0–44)
AST: 20 U/L (ref 15–41)
Albumin: 1.8 g/dL — ABNORMAL LOW (ref 3.5–5.0)
Alkaline Phosphatase: 61 U/L (ref 38–126)
Anion gap: 11 (ref 5–15)
BUN: 66 mg/dL — ABNORMAL HIGH (ref 6–20)
CO2: 24 mmol/L (ref 22–32)
Calcium: 6.8 mg/dL — ABNORMAL LOW (ref 8.9–10.3)
Chloride: 102 mmol/L (ref 98–111)
Creatinine, Ser: 5.21 mg/dL — ABNORMAL HIGH (ref 0.44–1.00)
GFR, Estimated: 10 mL/min — ABNORMAL LOW (ref >60)
Glucose, Bld: 124 mg/dL — ABNORMAL HIGH (ref 70–99)
Potassium: 3.6 mmol/L (ref 3.5–5.1)
Sodium: 137 mmol/L (ref 135–145)
Total Bilirubin: 0.3 mg/dL (ref 0.3–1.2)
Total Protein: 4.2 g/dL — ABNORMAL LOW (ref 6.5–8.1)

## 2020-12-07 LAB — CBC
HCT: 21.6 % — ABNORMAL LOW (ref 36.0–46.0)
Hemoglobin: 7.2 g/dL — ABNORMAL LOW (ref 12.0–15.0)
MCH: 29.8 pg (ref 26.0–34.0)
MCHC: 33.3 g/dL (ref 30.0–36.0)
MCV: 89.3 fL (ref 80.0–100.0)
Platelets: 68 10*3/uL — ABNORMAL LOW (ref 150–400)
RBC: 2.42 MIL/uL — ABNORMAL LOW (ref 3.87–5.11)
RDW: 15.3 % (ref 11.5–15.5)
WBC: 6.7 10*3/uL (ref 4.0–10.5)
nRBC: 0 % (ref 0.0–0.2)

## 2020-12-07 LAB — PREGNANCY, URINE: Preg Test, Ur: NEGATIVE

## 2020-12-07 MED ORDER — DARBEPOETIN ALFA 100 MCG/0.5ML IJ SOSY
100.0000 ug | PREFILLED_SYRINGE | Freq: Once | INTRAMUSCULAR | Status: AC
  Administered 2020-12-07: 100 ug via SUBCUTANEOUS
  Filled 2020-12-07 (×2): qty 0.5

## 2020-12-07 MED ORDER — FUROSEMIDE 80 MG PO TABS
80.0000 mg | ORAL_TABLET | Freq: Two times a day (BID) | ORAL | Status: DC
  Administered 2020-12-07 – 2020-12-08 (×2): 80 mg via ORAL
  Filled 2020-12-07 (×2): qty 1

## 2020-12-07 MED ORDER — HEPARIN SODIUM (PORCINE) 1000 UNIT/ML IJ SOLN
INTRAMUSCULAR | Status: AC
  Administered 2020-12-07: 4000 [IU]
  Filled 2020-12-07: qty 4

## 2020-12-07 NOTE — Plan of Care (Signed)
  Problem: Education: Goal: Knowledge of disease and its progression will improve Outcome: Progressing   Problem: Clinical Measurements: Goal: Dialysis access will remain free of complications Outcome: Progressing

## 2020-12-07 NOTE — Progress Notes (Signed)
PROGRESS NOTE    Cathy [name]  ONG:[id number] DOB: 10-17-1981 DOA: 11/29/2020 PCP: Patient, No Pcp Per   Brief Narrative: 40 year old with past medical history significant for SLE, CKD stage III AAA/lupus nephritis, hypertension, asthma, substance abuse, bipolar disorder, depression, history of medication noncompliance presented to the ED with shortness of breath worsening edema. -Admitted with anasarca and AKI on CKD 3, Nephrotic syndrome secondary to Lupus. -Nephrology following, started on HD. Underwent Kidney Bx 1/03.   Assessment & Plan:  AKI on CKD stage IIIa lupus/lupus nephritis: Metabolic acidosis Severe hypoalbuminemia -Baseline creatinine in the 2.5-3 range, on admission creatinine was 6.5 with hyperkalemia -Nephrology following, underwent Tunneled HD catheter placement on 12-31, first HD treatment 1/1 -Also treated with high-dose IV Solu-Medrol now on prednisone -Underwent kidney biopsy 1/3 --remains profoundly volume overloaded on  high-dose Lasix and dialysis, last HD yesterday -Per nephrology, plan to start clip for outpatient HD  Hyperkalemia: On admission received bicarbonate, Lokelma, calcium gluconate. Normalized.   Hypertensive urgency: Continue with amlodipine and labetalol.   IV lasix.   Acute on chronic anemia -Anemia panel suggestive of chronic disease -Hemoglobin down to 7.2, continue EPO, suspect a component of hemodilution as well given profoundly volume overloaded state  Lupus: Was  On prednisone taper out patient.  Received high-dose Solu-Medrol, for 3 days.  Currently on prednisone. Needs Follow-up with rheumatology.  Thrombocytopenia:  -Could be from SLE, monitor -Heparin on hold  Doubt community-acquired pneumonia: -Treated with 5 days of antibiotics, now completed  Epigastric pain: Started  PPI   Estimated body mass index is 34.18 kg/m as calculated from the following:   Height as of this encounter: 5\' 7"  (1.702 m).   Weight  as of this encounter: 99 kg.   DVT prophylaxis: SCDs due to thrombocytopenia Code Status: Full code Family Communication: Discussed patient in detail, no family at bedside Disposition Plan:  Status is: Inpatient  Remains inpatient appropriate because:IV treatments appropriate due to intensity of illness or inability to take PO   Dispo: The patient is from: Home              Anticipated d/c is to: Home              Anticipated d/c date is: >3 days              Patient currently is not medically stable to d/c.  Consultants:   Nephrology  IR  Procedures:   HD catheter   Kidney biopsy 1/3  Antimicrobials:    Subjective: -Feels a little better, about to go for dialysis today, breathing improving, swelling starting to improve as well  Objective: Vitals:   12/07/20 0850 12/07/20 0915 12/07/20 0945 12/07/20 1013  BP: (!) 190/121 (!) 186/122 (!) 168/114 (!) 168/114  Pulse:    72  Resp: (!) 27 (!) 23 19 19   Temp:      TempSrc:      SpO2:      Weight:      Height:        Intake/Output Summary (Last 24 hours) at 12/07/2020 1041 Last data filed at 12/07/2020 0816 Gross per 24 hour  Intake 1000 ml  Output 360 ml  Net 640 ml   Filed Weights   12/05/20 2025 12/06/20 1900 12/07/20 0830  Weight: 98 kg 102 kg 99 kg    Examination:  General exam: Chronically ill pleasant female sitting up in bed, AAOx3, no distress HEENT: Positive JVD CVS: S1-S2, regular rate rhythm Lungs: Decreased breath  sounds at both bases Abdomen: Firm, nontender, bowel sounds present, abdominal wall edema noted Extremities: 2+ edema   Skin: No rashes on exposed skin Psych: Flat affect   Data Reviewed: I have personally reviewed following labs and imaging studies  CBC: Recent Labs  Lab 12/04/20 0701 12/04/20 1158 12/04/20 1946 12/05/20 0105 12/06/20 0257 12/07/20 0503  WBC 8.0  --  8.1 6.9 6.6 6.7  HGB 6.7* 6.6* 8.5* 7.5* 7.2* 7.2*  HCT 19.6*  --  24.3* 21.2* 21.5* 21.6*  MCV 86.7   --  85.6 85.1 87.0 89.3  PLT PLATELET CLUMPS NOTED ON SMEAR, COUNT APPEARS DECREASED  --  121* 108* 77* 68*   Basic Metabolic Panel: Recent Labs  Lab 12/01/20 0247 12/01/20 1406 12/02/20 0447 12/03/20 0156 12/04/20 0701 12/05/20 0105 12/06/20 0257 12/06/20 1756 12/07/20 0503  NA 139   < > 137 137 137 137 137 137 137  K 5.7*   < > 5.2* 4.9 3.5 4.4 3.8 4.0 3.6  CL 106   < > 105 104 105 103 102 101 102  CO2 19*   < > 20* 17* 21* 21* 25 24 24   GLUCOSE 122*   < > 140* 142* 129* 113* 149* 179* 124*  BUN 123*   < > 101* 113* 73* 86* 53* 59* 66*  CREATININE 6.74*   < > 6.19* 6.38* 4.45* 4.95* 4.07* 4.73* 5.21*  CALCIUM 6.8*   < > 6.5* 6.6* 6.7* 6.7* 6.7* 6.9* 6.8*  PHOS 9.2*  --  8.6* 9.1* 6.8* 7.7*  --   --   --    < > = values in this interval not displayed.   GFR: Estimated Creatinine Clearance: 17.9 mL/min (A) (by C-G formula based on SCr of 5.21 mg/dL (H)). Liver Function Tests: Recent Labs  Lab 12/03/20 0156 12/04/20 0701 12/05/20 0105 12/06/20 1756 12/07/20 0503  AST  --   --   --  31 20  ALT  --   --   --  32 25  ALKPHOS  --   --   --  76 61  BILITOT  --   --   --  0.6 0.3  PROT  --   --   --  4.6* 4.2*  ALBUMIN 2.0* 2.0* 1.8* 1.9* 1.8*   No results for input(s): LIPASE, AMYLASE in the last 168 hours. No results for input(s): AMMONIA in the last 168 hours. Coagulation Profile: Recent Labs  Lab 12/03/20 0156  INR 1.0   Cardiac Enzymes: No results for input(s): CKTOTAL, CKMB, CKMBINDEX, TROPONINI in the last 168 hours. BNP (last 3 results) No results for input(s): PROBNP in the last 8760 hours. HbA1C: No results for input(s): HGBA1C in the last 72 hours. CBG: No results for input(s): GLUCAP in the last 168 hours. Lipid Profile: No results for input(s): CHOL, HDL, LDLCALC, TRIG, CHOLHDL, LDLDIRECT in the last 72 hours. Thyroid Function Tests: No results for input(s): TSH, T4TOTAL, FREET4, T3FREE, THYROIDAB in the last 72 hours. Anemia Panel: No results for  input(s): VITAMINB12, FOLATE, FERRITIN, TIBC, IRON, RETICCTPCT in the last 72 hours. Sepsis Labs: No results for input(s): PROCALCITON, LATICACIDVEN in the last 168 hours.  Recent Results (from the past 240 hour(s))  Resp Panel by RT-PCR (Flu A&B, Covid) Nasopharyngeal Swab     Status: None   Collection Time: 11/29/20  7:24 PM   Specimen: Nasopharyngeal Swab; Nasopharyngeal(NP) swabs in vial transport medium  Result Value Ref Range Status   SARS Coronavirus 2 by RT  PCR NEGATIVE NEGATIVE Final    Comment: (NOTE) SARS-CoV-2 target nucleic acids are NOT DETECTED.  The SARS-CoV-2 RNA is generally detectable in upper respiratory specimens during the acute phase of infection. The lowest concentration of SARS-CoV-2 viral copies this assay can detect is 138 copies/mL. A negative result does not preclude SARS-Cov-2 infection and should not be used as the sole basis for treatment or other patient management decisions. A negative result may occur with  improper specimen collection/handling, submission of specimen other than nasopharyngeal swab, presence of viral mutation(s) within the areas targeted by this assay, and inadequate number of viral copies(<138 copies/mL). A negative result must be combined with clinical observations, patient history, and epidemiological information. The expected result is Negative.  Fact Sheet for Patients:  EntrepreneurPulse.com.au  Fact Sheet for Healthcare Providers:  IncredibleEmployment.be  This test is no t yet approved or cleared by the [place] FDA and  has been authorized for detection and/or diagnosis of SARS-CoV-2 by FDA under an Emergency Use Authorization (EUA). This EUA will remain  in effect (meaning this test can be used) for the duration of the COVID-19 declaration under Section 564(b)(1) of the Act, 21 U.S.C.section 360bbb-3(b)(1), unless the authorization is terminated  or revoked sooner.        Influenza A by PCR NEGATIVE NEGATIVE Final   Influenza B by PCR NEGATIVE NEGATIVE Final    Comment: (NOTE) The Xpert Xpress SARS-CoV-2/FLU/RSV plus assay is intended as an aid in the diagnosis of influenza from Nasopharyngeal swab specimens and should not be used as a sole basis for treatment. Nasal washings and aspirates are unacceptable for Xpert Xpress SARS-CoV-2/FLU/RSV testing.  Fact Sheet for Patients: EntrepreneurPulse.com.au  Fact Sheet for Healthcare Providers: IncredibleEmployment.be  This test is not yet approved or cleared by the [place] FDA and has been authorized for detection and/or diagnosis of SARS-CoV-2 by FDA under an Emergency Use Authorization (EUA). This EUA will remain in effect (meaning this test can be used) for the duration of the COVID-19 declaration under Section 564(b)(1) of the Act, 21 U.S.C. section 360bbb-3(b)(1), unless the authorization is terminated or revoked.  Performed at [facility], [place] [address], [place], [place] [address]   Culture, blood (routine x 2)     Status: None   Collection Time: 11/30/20  3:19 AM   Specimen: BLOOD LEFT ARM  Result Value Ref Range Status   Specimen Description BLOOD LEFT ARM  Final   Special Requests   Final    BOTTLES DRAWN AEROBIC AND ANAEROBIC Blood Culture results may not be optimal due to an excessive volume of blood received in culture bottles   Culture   Final    NO GROWTH 5 DAYS Performed at [facility], [place] [address] [place], [place] [address]    Report Status 12/05/2020 FINAL  Final  Culture, blood (routine x 2)     Status: None   Collection Time: 11/30/20  3:19 AM   Specimen: BLOOD  Result Value Ref Range Status   Specimen Description BLOOD RIGHT ANTECUBITAL  Final   Special Requests   Final    BOTTLES DRAWN AEROBIC AND ANAEROBIC Blood Culture results may not be optimal due to an excessive volume of blood received in culture bottles    Culture   Final    NO GROWTH 5 DAYS Performed at [facility], [place] [address], [place],  [address]    Report Status 12/05/2020 FINAL  Final    Scheduled Meds: .  amLODipine  10 mg Oral Daily  . Chlorhexidine Gluconate Cloth  6 each Topical Q0600  . cholecalciferol  1,000 Units Oral Daily  . cyclophosphamide  500 mg Intravenous Once  . darbepoetin (ARANESP) injection - DIALYSIS  100 mcg Intravenous Q Fri-HD  . folic acid  1 mg Oral Daily  . furosemide  80 mg Oral BID  . hydrocortisone cream   Topical BID  . labetalol  400 mg Oral BID  . methylPREDNISolone (SOLU-MEDROL) injection  125 mg Intravenous Once  . ondansetron  4 mg Oral Once  . pantoprazole  40 mg Oral Daily  . polyethylene glycol  17 g Oral BID  . predniSONE  40 mg Oral Q breakfast  . vitamin B-12  1,000 mcg Oral Daily   Continuous Infusions: . sodium chloride    . sodium chloride Stopped (12/03/20 0650)     LOS: 8 days    Time spent: 25 minutes.   Domenic Polite, MD [facility]  12/07/2020, 10:41 AM

## 2020-12-07 NOTE — Plan of Care (Signed)
  Problem: Education: Goal: Knowledge of disease and its progression will improve Outcome: Progressing   Problem: Health Behavior/Discharge Planning: Goal: Ability to manage health-related needs will improve Outcome: Progressing   Problem: Clinical Measurements: Goal: Complications related to the disease process or treatment will be avoided or minimized Outcome: Progressing

## 2020-12-07 NOTE — Progress Notes (Signed)
[facility] Progress Note    Assessment/ Plan:   Assessment/Recommendations: Cathy [name] is a/an 40 y.o. female with a past medical history significant for SLE, HTN, CKD, admitted for volume overload, hyperkalemia, renal failure, presumptive lupus nephritis.     Non-oliguric AKI, severe: Likely secondary to lupus nephritis but other factors contributing. 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 Some chronicity present but pathologist felt to be salvageable.  -Undergoing dialysis today; Cont MWF schedule -Plan for cytoxan 500mg  IV today q2wks for 6 doses (Eurolupus); will need to set up outpt infusions next week -Continue oral prednisone 40mg  daily -Ppx includes: bactrim, protonix, and vit D -Will plan to initiate CLIP process as AKI with hopeful recovery -TDC placed by IR on 12/31, appreciate help -Switch to oral lasix 80mg  BID  Volume overload: likely 2/2 ARF and nephrotic syndrome. Overall improved -Dialysis as above -Switch to oral lasix 80mg  bid  SLE: known lupus but no kidney biopsy due to compliance issues in the past. Labs on 11/07/20 ANA+, Anti-dsDNA 31, +anti-centromere, chromatin, smith, ro, la. SLE nephritis as above w/ plans for cytoxan. Will need to see Rheum outpatient  Hypertension: likely will improve w/ volume optimization. Steroids contributing -continue norvasc 10mg  daily, labetalol 400mg  BID -Lasix as above  Anemia due to CKD  -Transfuse for Hgb<7 g/dL; ctm - anemia workup drawn 12/04/20 - PM hgb acceptable 12/04/20 - start aranesp on HD for 12/07/20  Subjective:     Feels well today. On dialysis. Cytoxan today.   Objective:   BP (!) 168/114 (BP Location: Left Arm)   Pulse 72   Temp 99 F (37.2 C) (Oral)   Resp 19   Ht 5\' 7"  (1.702 m)   Wt 99 kg   SpO2 100%   BMI 34.18 kg/m   Intake/Output Summary (Last 24 hours) at 12/07/2020 1150 Last data filed at 12/07/2020 0816 Gross per 24 hour  Intake 1000 ml  Output 360 ml   Net 640 ml   Weight change: 6.4 kg  Physical Exam: Gen: NAD, lying in bed CVS: RRR Resp: clear Abd: soft IFO:YDXAJOINOM total body edema somewhat improved ACCESS: R IJ TDC  Imaging: No results found.  Labs: BMET Recent Labs  Lab 12/01/20 0247 12/01/20 1406 12/02/20 0447 12/03/20 0156 12/04/20 0701 12/05/20 0105 12/06/20 0257 12/06/20 1756 12/07/20 0503  NA 139   < > 137 137 137 137 137 137 137  K 5.7*   < > 5.2* 4.9 3.5 4.4 3.8 4.0 3.6  CL 106   < > 105 104 105 103 102 101 102  CO2 19*   < > 20* 17* 21* 21* 25 24 24   GLUCOSE 122*   < > 140* 142* 129* 113* 149* 179* 124*  BUN 123*   < > 101* 113* 73* 86* 53* 59* 66*  CREATININE 6.74*   < > 6.19* 6.38* 4.45* 4.95* 4.07* 4.73* 5.21*  CALCIUM 6.8*   < > 6.5* 6.6* 6.7* 6.7* 6.7* 6.9* 6.8*  PHOS 9.2*  --  8.6* 9.1* 6.8* 7.7*  --   --   --    < > = values in this interval not displayed.   CBC Recent Labs  Lab 12/04/20 1946 12/05/20 0105 12/06/20 0257 12/07/20 0503  WBC 8.1 6.9 6.6 6.7  HGB 8.5* 7.5* 7.2* 7.2*  HCT 24.3* 21.2* 21.5* 21.6*  MCV 85.6 85.1 87.0 89.3  PLT 121* 108* 77* 68*    Medications:    . amLODipine  10  mg Oral Daily  . Chlorhexidine Gluconate Cloth  6 each Topical Q0600  . cholecalciferol  1,000 Units Oral Daily  . cyclophosphamide  500 mg Intravenous Once  . darbepoetin (ARANESP) injection - DIALYSIS  100 mcg Intravenous Q Fri-HD  . folic acid  1 mg Oral Daily  . furosemide  80 mg Oral BID  . hydrocortisone cream   Topical BID  . labetalol  400 mg Oral BID  . methylPREDNISolone (SOLU-MEDROL) injection  125 mg Intravenous Once  . ondansetron  4 mg Oral Once  . pantoprazole  40 mg Oral Daily  . polyethylene glycol  17 g Oral BID  . predniSONE  40 mg Oral Q breakfast  . vitamin B-12  1,000 mcg Oral Daily      Reesa Chew  12/07/2020, 11:50 AM

## 2020-12-07 NOTE — Progress Notes (Signed)
PT Cancellation Note  Patient Details Name: Cathy [name] MRN: [id number] DOB: [birth year]   Cancelled Treatment:    Reason Eval/Treat Not Completed: Patient at procedure or test/unavailable. Pt at dialysis this am. Will follow up later today as time allows vs another date.   Willow Ora, PTA, CLT Acute Rehab Services [phone] 12/07/20, 10:37 AM   Willow Ora 12/07/2020, 10:36 AM

## 2020-12-07 NOTE — Progress Notes (Signed)
PT Cancellation Note  Patient Details Name: Cathy [name] MRN: [id number] DOB: Sep 27, 1981   Cancelled Treatment:    Reason Eval/Treat Not Completed: Fatigue/lethargy limiting ability to participate. Pt post dialysis this am and IV chemo treatment today. Sound asleep. Will follow up next day for PT session to assist with post acute discharge planning due to syncopal episode with eval yesterday. Acute PT to continue.   Willow Ora, PTA, CLT Acute Rehab Services [phone] 12/07/20, 4:11 PM   Willow Ora 12/07/2020, 4:09 PM

## 2020-12-07 NOTE — Progress Notes (Signed)
Called Dr. Joylene Grapes (nephrology) to notify of PLT 68k. Per MD: Treat with cyclophosphamide despite PLT 68k. Clarified steroid orders as well as it appears pt has only received prednisone OR solu-medrol in the past (not both). Per Dr. Joylene Grapes: Give solu-medrol only today. Will make RN aware.

## 2020-12-07 NOTE — Procedures (Signed)
Seen and evaluated on HD. BP somewhat elevated. Tolerating at the time of exam. 3K bath DFR 800 and BFR 400 as tolerated. UF goal 4L. No issues.

## 2020-12-08 DIAGNOSIS — D631 Anemia in chronic kidney disease: Secondary | ICD-10-CM | POA: Diagnosis not present

## 2020-12-08 DIAGNOSIS — N189 Chronic kidney disease, unspecified: Secondary | ICD-10-CM | POA: Diagnosis not present

## 2020-12-08 DIAGNOSIS — E8770 Fluid overload, unspecified: Secondary | ICD-10-CM | POA: Diagnosis not present

## 2020-12-08 DIAGNOSIS — N179 Acute kidney failure, unspecified: Secondary | ICD-10-CM | POA: Diagnosis not present

## 2020-12-08 LAB — BPAM RBC
Blood Product Expiration Date: 202202032359
Blood Product Expiration Date: 202202032359
ISSUE DATE / TIME: 202112262206
ISSUE DATE / TIME: 202201041539
Unit Type and Rh: 5100
Unit Type and Rh: 5100

## 2020-12-08 LAB — TYPE AND SCREEN
ABO/RH(D): O POS
Antibody Screen: NEGATIVE
Donor AG Type: NEGATIVE
Donor AG Type: NEGATIVE
Unit division: 0
Unit division: 0

## 2020-12-08 LAB — BASIC METABOLIC PANEL
Anion gap: 12 (ref 5–15)
BUN: 48 mg/dL — ABNORMAL HIGH (ref 6–20)
CO2: 24 mmol/L (ref 22–32)
Calcium: 6.9 mg/dL — ABNORMAL LOW (ref 8.9–10.3)
Chloride: 103 mmol/L (ref 98–111)
Creatinine, Ser: 4.61 mg/dL — ABNORMAL HIGH (ref 0.44–1.00)
GFR, Estimated: 12 mL/min — ABNORMAL LOW (ref >60)
Glucose, Bld: 114 mg/dL — ABNORMAL HIGH (ref 70–99)
Potassium: 4.1 mmol/L (ref 3.5–5.1)
Sodium: 139 mmol/L (ref 135–145)

## 2020-12-08 LAB — CBC
HCT: 21.9 % — ABNORMAL LOW (ref 36.0–46.0)
Hemoglobin: 7.3 g/dL — ABNORMAL LOW (ref 12.0–15.0)
MCH: 29.3 pg (ref 26.0–34.0)
MCHC: 33.3 g/dL (ref 30.0–36.0)
MCV: 88 fL (ref 80.0–100.0)
Platelets: 75 10*3/uL — ABNORMAL LOW (ref 150–400)
RBC: 2.49 MIL/uL — ABNORMAL LOW (ref 3.87–5.11)
RDW: 15.2 % (ref 11.5–15.5)
WBC: 5.6 10*3/uL (ref 4.0–10.5)
nRBC: 0 % (ref 0.0–0.2)

## 2020-12-08 LAB — GLUCOSE, CAPILLARY: Glucose-Capillary: 137 mg/dL — ABNORMAL HIGH (ref 70–99)

## 2020-12-08 MED ORDER — FUROSEMIDE 80 MG PO TABS
120.0000 mg | ORAL_TABLET | Freq: Two times a day (BID) | ORAL | Status: DC
  Administered 2020-12-08 – 2020-12-12 (×7): 120 mg via ORAL
  Filled 2020-12-08 (×8): qty 1

## 2020-12-08 MED ORDER — ONDANSETRON HCL 4 MG/2ML IJ SOLN
4.0000 mg | Freq: Four times a day (QID) | INTRAMUSCULAR | Status: DC | PRN
  Administered 2020-12-08: 4 mg via INTRAVENOUS
  Filled 2020-12-08: qty 2

## 2020-12-08 NOTE — Progress Notes (Signed)
PROGRESS NOTE    ZIZA HASTINGS  EHU:[id number] DOB: 01-09-1981 DOA: 11/29/2020 PCP: Patient, No Pcp Per   Brief Narrative: 40 year old with past medical history significant for SLE, CKD stage III AAA/lupus nephritis, hypertension, asthma, substance abuse, bipolar disorder, depression, history of medication noncompliance presented to the ED with shortness of breath worsening edema. -Admitted with anasarca and AKI on CKD 3, Nephrotic syndrome secondary to Lupus. -Nephrology following, started on HD. Underwent Kidney Bx 1/03. 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 Normalized.   Hypertensive urgency: Continue with amlodipine and labetalol.   -P.o. Lasix  Acute on chronic anemia -Anemia panel suggestive of chronic disease -Hemoglobin down to 7.2, continue EPO, suspect a component of hemodilution as well given profoundly volume overloaded state  Lupus: Was  On prednisone taper out patient.  Received high-dose Solu-Medrol, for 3 days.  Currently on prednisone. Needs Follow-up with rheumatology.  Thrombocytopenia:  -Could be from SLE, monitor -Heparin on hold, improving  Doubt community-acquired pneumonia: -Treated with 5 days of antibiotics, now completed  Epigastric pain: Started  PPI   Estimated body mass index is  31.97 kg/m as calculated from the following:   Height as of this encounter: 5\' 7"  (1.702 m).   Weight as of this encounter: 92.6 kg.   DVT prophylaxis: SCDs due to thrombocytopenia Code Status: Full code Family Communication: Discussed patient in detail, no family at bedside Disposition Plan:  Status is: Inpatient  Remains inpatient appropriate because:IV treatments appropriate due to intensity of illness or inability to take PO   Dispo: The patient is from: Home              Anticipated d/c is to: Home              Anticipated d/c date is: >3 days              Patient currently is not medically stable to d/c.  Consultants:   Nephrology  IR  Procedures:   HD catheter   Kidney biopsy 1/3  Antimicrobials:    Subjective: -Was dialyzed yesterday, feeling a little better overall, breathing improving Objective: Vitals:   12/07/20 1804 12/07/20 2120 12/08/20 0626 12/08/20 1004  BP: (!) 143/130 (!) 172/112 (!) 185/126 (!) 158/101  Pulse: 66 70 78 72  Resp: 18 20 20 20   Temp: 98.9 F (37.2 C) 98.6 F (37 C) 98.5 F (36.9 C) 99.5 F (37.5 C)  TempSrc: Oral Oral Oral Oral  SpO2: 97% 96% 97% 97%  Weight:      Height:        Intake/Output Summary (Last 24 hours) at 12/08/2020 1104 Last data filed at 12/08/2020 0900 Gross per 24 hour  Intake 1513.49 ml  Output 3162 ml  Net -1648.51 ml   Filed Weights   12/06/20 1900 12/07/20 0830 12/07/20 1140  Weight: 102 kg 99 kg 92.6 kg    Examination:  General exam: Chronically ill pleasant female sitting up in bed, AAOx3,  no distress HEENT: Positive JVD CVS: S1-S2, regular rate rhythm Lungs: Decreased breath sounds at the bases Abdomen: Firm, nontender, abdominal wall edema, bowel sounds present Extremities: 2-3+ edema   Skin: No rashes on exposed skin Psych: Flat affect   Data Reviewed: I have personally reviewed following labs and imaging studies  CBC: Recent Labs  Lab 12/04/20 1946 12/05/20 0105 12/06/20 0257  12/07/20 0503 12/08/20 0250  WBC 8.1 6.9 6.6 6.7 5.6  HGB 8.5* 7.5* 7.2* 7.2* 7.3*  HCT 24.3* 21.2* 21.5* 21.6* 21.9*  MCV 85.6 85.1 87.0 89.3 88.0  PLT 121* 108* 77* 68* 75*   Basic Metabolic Panel: Recent Labs  Lab 12/02/20 0447 12/03/20 0156 12/04/20 0701 12/05/20 0105 12/06/20 0257 12/06/20 1756 12/07/20 0503 12/08/20 0250  NA 137 137 137 137 137 137 137 139  K 5.2* 4.9 3.5 4.4 3.8 4.0 3.6 4.1  CL 105 104 105 103 102 101 102 103  CO2 20* 17* 21* 21* 25 24 24 24   GLUCOSE 140* 142* 129* 113* 149* 179* 124* 114*  BUN 101* 113* 73* 86* 53* 59* 66* 48*  CREATININE 6.19* 6.38* 4.45* 4.95* 4.07* 4.73* 5.21* 4.61*  CALCIUM 6.5* 6.6* 6.7* 6.7* 6.7* 6.9* 6.8* 6.9*  PHOS 8.6* 9.1* 6.8* 7.7*  --   --   --   --    GFR: Estimated Creatinine Clearance: 19.5 mL/min (A) (by C-G formula based on SCr of 4.61 mg/dL (H)). Liver Function Tests: Recent Labs  Lab 12/03/20 0156 12/04/20 0701 12/05/20 0105 12/06/20 1756 12/07/20 0503  AST  --   --   --  31 20  ALT  --   --   --  32 25  ALKPHOS  --   --   --  76 61  BILITOT  --   --   --  0.6 0.3  PROT  --   --   --  4.6* 4.2*  ALBUMIN 2.0* 2.0* 1.8* 1.9* 1.8*   No results for input(s): LIPASE, AMYLASE in the last 168 hours. No results for input(s): AMMONIA in the last 168 hours. Coagulation Profile: Recent Labs  Lab 12/03/20 0156  INR 1.0   Cardiac Enzymes: No results for input(s): CKTOTAL, CKMB, CKMBINDEX, TROPONINI in the last 168 hours. BNP (last 3 results) No results for input(s): PROBNP in the last 8760 hours. HbA1C: No results for input(s): HGBA1C in the last 72 hours. CBG: No results for input(s): GLUCAP in the last 168 hours. Lipid Profile: No results for input(s): CHOL, HDL, LDLCALC, TRIG, CHOLHDL, LDLDIRECT in the last 72 hours. Thyroid Function Tests: No results for input(s): TSH, T4TOTAL, FREET4, T3FREE, THYROIDAB in the last 72 hours. Anemia Panel: No results for input(s): VITAMINB12, FOLATE, FERRITIN, TIBC,  IRON, RETICCTPCT in the last 72 hours. Sepsis Labs: No results for input(s): PROCALCITON, LATICACIDVEN in the last 168 hours.  Recent Results (from the past 240 hour(s))  Resp Panel by RT-PCR (Flu A&B, Covid) Nasopharyngeal Swab     Status: None   Collection Time: 11/29/20  7:24 PM   Specimen: Nasopharyngeal Swab; Nasopharyngeal(NP) swabs in vial transport medium  Result Value Ref Range Status   SARS Coronavirus 2 by RT PCR NEGATIVE NEGATIVE Final    Comment: (NOTE) SARS-CoV-2 target nucleic acids are NOT DETECTED.  The SARS-CoV-2 RNA is generally detectable in upper respiratory specimens during the acute phase of infection. The lowest concentration of SARS-CoV-2 viral copies this assay can detect is 138 copies/mL. A negative result does not preclude SARS-Cov-2 infection and  should not be used as the sole basis for treatment or other patient management decisions. A negative result may occur with  improper specimen collection/handling, submission of specimen other than nasopharyngeal swab, presence of viral mutation(s) within the areas targeted by this assay, and inadequate number of viral copies(<138 copies/mL). A negative result must be combined with clinical observations, patient history, and epidemiological information. The expected result is Negative.  Fact Sheet for Patients:  EntrepreneurPulse.com.au  Fact Sheet for Healthcare Providers:  IncredibleEmployment.be  This test is no t yet approved or cleared by the [place] FDA and  has been authorized for detection and/or diagnosis of SARS-CoV-2 by FDA under an Emergency Use Authorization (EUA). This EUA will remain  in effect (meaning this test can be used) for the duration of the COVID-19 declaration under Section 564(b)(1) of the Act, 21 U.S.C.section 360bbb-3(b)(1), unless the authorization is terminated  or revoked sooner.       Influenza A by PCR NEGATIVE NEGATIVE Final    Influenza B by PCR NEGATIVE NEGATIVE Final    Comment: (NOTE) The Xpert Xpress SARS-CoV-2/FLU/RSV plus assay is intended as an aid in the diagnosis of influenza from Nasopharyngeal swab specimens and should not be used as a sole basis for treatment. Nasal washings and aspirates are unacceptable for Xpert Xpress SARS-CoV-2/FLU/RSV testing.  Fact Sheet for Patients: EntrepreneurPulse.com.au  Fact Sheet for Healthcare Providers: IncredibleEmployment.be  This test is not yet approved or cleared by the [place] FDA and has been authorized for detection and/or diagnosis of SARS-CoV-2 by FDA under an Emergency Use Authorization (EUA). This EUA will remain in effect (meaning this test can be used) for the duration of the COVID-19 declaration under Section 564(b)(1) of the Act, 21 U.S.C. section 360bbb-3(b)(1), unless the authorization is terminated or revoked.  Performed at [facility], [place] [address] [place], [place] [address]   Culture, blood (routine x 2)     Status: None   Collection Time: 11/30/20  3:19 AM   Specimen: BLOOD LEFT ARM  Result Value Ref Range Status   Specimen Description BLOOD LEFT ARM  Final   Special Requests   Final    BOTTLES DRAWN AEROBIC AND ANAEROBIC Blood Culture results may not be optimal due to an excessive volume of blood received in culture bottles   Culture   Final    NO GROWTH 5 DAYS Performed at [facility], [place] [address], [place], [place] [address]    Report Status 12/05/2020 FINAL  Final  Culture, blood (routine x 2)     Status: None   Collection Time: 11/30/20  3:19 AM   Specimen: BLOOD  Result Value Ref Range Status   Specimen Description BLOOD RIGHT ANTECUBITAL  Final   Special Requests   Final    BOTTLES DRAWN AEROBIC AND ANAEROBIC Blood Culture results may not be optimal due to an excessive volume of blood received in culture bottles   Culture   Final    NO GROWTH 5 DAYS Performed  at [facility], [place] [address], [place], [place] [address]    Report Status 12/05/2020 FINAL  Final    Scheduled Meds: . amLODipine  10 mg Oral Daily  . Chlorhexidine Gluconate Cloth  6 each Topical Q0600  . cholecalciferol  1,000 Units Oral Daily  . darbepoetin (ARANESP) injection - DIALYSIS  100 mcg Intravenous Q Fri-HD  . folic acid  1 mg Oral Daily  . furosemide  120 mg Oral BID  .  hydrocortisone cream   Topical BID  . labetalol  400 mg Oral BID  . pantoprazole  40 mg Oral Daily  . polyethylene glycol  17 g Oral BID  . predniSONE  40 mg Oral Q breakfast  . vitamin B-12  1,000 mcg Oral Daily   Continuous Infusions: . sodium chloride    . sodium chloride 250 mL (12/07/20 1307)     LOS: 9 days    Time spent: 25 minutes.   Domenic Polite, MD [facility]  12/08/2020, 11:04 AM

## 2020-12-08 NOTE — Progress Notes (Signed)
Physical Therapy Treatment Patient Details Name: Cathy [name] MRN: [id number] DOB: 05/19/1981 Today's Date: 12/08/2020    History of Present Illness Pt is 40 yo female with PMH including systemic lupus erythematosus, HTN, and CKD,  She was admitted for volume overload, hyperkalemia, renal failure, and presumptive lupus nephritis.  Pt had HD on 1/1, 1/4, and 1/5, with plan for next HD 1/7.    PT Comments    The pt was able to demo significant improvement in mobility and activity tolerance during this session. The pt was able to complete all bed mobility and initial sit-stand transfers without physical assist, UE support, or instability. The pt also completed hallway ambulation without support or AD, but remains limited in endurance, and required multiple standing rests to recover SOB during 155ft ambulation. The pt was then able to recover with seated rest, before completing a second bout of hallway ambulation without UE support or assist. The pt was educated on energy conservation, guided breathing, and will continue to benefit from skilled PT to further progress dynamic stability and endurance to allow for pt goal of safe return home with her children (must be able to care for 59 yo with assist from her mom).   Follow Up Recommendations  Supervision - Intermittent;Home health PT     Equipment Recommendations  Other (comment) (possible rollator)    Recommendations for Other Services       Precautions / Restrictions Precautions Precautions: Fall Precaution Comments: Syncopal at initial eval, stable at follow-up    Mobility  Bed Mobility Overal bed mobility: Independent                Transfers Overall transfer level: Needs assistance Equipment used: None Transfers: Sit to/from Stand Sit to Stand: Min guard         General transfer comment: minG to steady with initial stand, pt reports mild dizziness, but able to steady without UE support or  assist  Ambulation/Gait Ambulation/Gait assistance: Min guard Gait Distance (Feet): 150 Feet (+100 ft) Assistive device: None Gait Pattern/deviations: Step-through pattern;Decreased stride length Gait velocity: decreased Gait velocity interpretation: <1.8 ft/sec, indicate of risk for recurrent falls General Gait Details: slowed gait with slight waddle/wide BOS, decreased heel strike bilaterally. pt ambulated 150 ft without UE support or assist, with 3 standing rest breaks due to 3/4 DOE (HR and SpO2 stable). after seated rest for 3-5 min, pt able to return to hallway ambulation without UE support or assist. VSS throughout      Balance Overall balance assessment: Needs assistance Sitting-balance support: No upper extremity supported Sitting balance-Leahy Scale: Normal     Standing balance support: No upper extremity supported Standing balance-Leahy Scale: Fair Standing balance comment: mild unsteadiness with gait                            Cognition Arousal/Alertness: Awake/alert Behavior During Therapy: WFL for tasks assessed/performed Overall Cognitive Status: Within Functional Limits for tasks assessed                                 General Comments: pleasant, good memory of past therapies, good safety awareness      Exercises General Exercises - Lower Extremity Ankle Circles/Pumps: AROM;Both;10 reps;Sidelying Long Arc Quad: AROM;Both;10 reps;Supine Heel Raises: AROM;Both;10 reps;Seated    General Comments General comments (skin integrity, edema, etc.): Pt able to maintain VSS on RA this session,  no change in BP with upright activity or exertion. Pt with continued pitting edema in bilateral feet, educated in HEP to work on ROM and importance of elevation      Pertinent Vitals/Pain Pain Assessment: Faces Faces Pain Scale: Hurts a little bit Pain Location: generalized stiffness in BLE (edema) Pain Descriptors / Indicators:  Discomfort;Grimacing Pain Intervention(s): Limited activity within patient's tolerance;Monitored during session;Repositioned           PT Goals (current goals can now be found in the care plan section) Acute Rehab PT Goals Patient Stated Goal: return home to children PT Goal Formulation: With patient Time For Goal Achievement: 12/20/20 Potential to Achieve Goals: Good Progress towards PT goals: Progressing toward goals    Frequency    Min 3X/week      PT Plan Current plan remains appropriate       AM-PAC PT "6 Clicks" Mobility   Outcome Measure  Help needed turning from your back to your side while in a flat bed without using bedrails?: None Help needed moving from lying on your back to sitting on the side of a flat bed without using bedrails?: None Help needed moving to and from a bed to a chair (including a wheelchair)?: A Little Help needed standing up from a chair using your arms (e.g., wheelchair or bedside chair)?: A Little Help needed to walk in hospital room?: A Little Help needed climbing 3-5 steps with a railing? : A Lot 6 Click Score: 19    End of Session Equipment Utilized During Treatment: Gait belt Activity Tolerance: Patient tolerated treatment well Patient left: in chair;with call bell/phone within reach;with chair alarm set Nurse Communication: Mobility status PT Visit Diagnosis: Other abnormalities of gait and mobility (R26.89);Muscle weakness (generalized) (M62.81);Dizziness and giddiness (R42)     Time: 9675-9163 PT Time Calculation (min) (ACUTE ONLY): 29 min  Charges:  $Gait Training: 23-37 mins                     Karma Ganja, PT, DPT   Acute Rehabilitation Department Pager #: [phone]   Otho Bellows 12/08/2020, 11:03 AM

## 2020-12-08 NOTE — Progress Notes (Signed)
Cathy [name] Progress Note    Assessment/ Plan:   Assessment/Recommendations: Cathy [name] is a/an 40 y.o. female with a past medical history significant for SLE, HTN, CKD, admitted for volume overload, hyperkalemia, renal failure, presumptive lupus nephritis.     Non-oliguric AKI, severe: Likely secondary to lupus nephritis but other factors contributing. 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 Some chronicity present but pathologist felt to be salvageable.  -Continue dialysis MWF -s/p cytoxan 500mg  IV on 1/8; plan for q2wk x 6 doses; will need to set up outpt infusions next week -Continue oral prednisone 40mg  daily -Ppx includes: bactrim, protonix, and vit D -Will plan to initiate CLIP process as AKI with hopeful recovery -TDC placed by IR on 12/31, appreciate help -Inc lasix to 120mg  BID  Volume overload: likely 2/2 ARF and nephrotic syndrome. Continues to be significant but overall improved. -Dialysis and lasix as above  SLE: known lupus but no kidney biopsy due to compliance issues in the past. Labs on 11/07/20 ANA+, Anti-dsDNA 31, +anti-centromere, chromatin, smith, ro, la. SLE nephritis as above w/ plans for cytoxan. Will need to see Rheum outpatient  Hypertension: likely will improve w/ volume optimization. Steroids contributing -continue norvasc 10mg  daily, labetalol 400mg  BID -Lasix as above  Anemia due to CKD  -Transfuse for Hgb<7 g/dL; ctm - anemia workup drawn 12/04/20 - PM hgb acceptable 12/04/20 - start aranesp on HD for 12/07/20  Subjective:     Feels okay today. Some SOB. Tolerated cytoxan with minimal issues.   Objective:   BP (!) 158/101   Pulse 72   Temp 99.5 F (37.5 C) (Oral)   Resp 20   Ht 5\' 7"  (1.702 m)   Wt 92.6 kg   SpO2 97%   BMI 31.97 kg/m   Intake/Output Summary (Last 24 hours) at 12/08/2020 1023 Last data filed at 12/08/2020 0600 Gross per 24 hour  Intake 1393.49 ml  Output 3162 ml  Net -1768.51 ml    Weight change: -3 kg  Physical Exam: Gen: NAD, lying in bed CVS: RRR Resp: clear Abd: soft but mild distention present MHD:[id number] total body edema  ACCESS: R IJ TDC  Imaging: No results found.  Labs: BMET Recent Labs  Lab 12/02/20 0447 12/03/20 0156 12/04/20 0701 12/05/20 0105 12/06/20 0257 12/06/20 1756 12/07/20 0503 12/08/20 0250  NA 137 137 137 137 137 137 137 139  K 5.2* 4.9 3.5 4.4 3.8 4.0 3.6 4.1  CL 105 104 105 103 102 101 102 103  CO2 20* 17* 21* 21* 25 24 24 24   GLUCOSE 140* 142* 129* 113* 149* 179* 124* 114*  BUN 101* 113* 73* 86* 53* 59* 66* 48*  CREATININE 6.19* 6.38* 4.45* 4.95* 4.07* 4.73* 5.21* 4.61*  CALCIUM 6.5* 6.6* 6.7* 6.7* 6.7* 6.9* 6.8* 6.9*  PHOS 8.6* 9.1* 6.8* 7.7*  --   --   --   --    CBC Recent Labs  Lab 12/05/20 0105 12/06/20 0257 12/07/20 0503 12/08/20 0250  WBC 6.9 6.6 6.7 5.6  HGB 7.5* 7.2* 7.2* 7.3*  HCT 21.2* 21.5* 21.6* 21.9*  MCV 85.1 87.0 89.3 88.0  PLT 108* 77* 68* 75*    Medications:    . amLODipine  10 mg Oral Daily  . Chlorhexidine Gluconate Cloth  6 each Topical Q0600  . cholecalciferol  1,000 Units Oral Daily  . darbepoetin (ARANESP) injection - DIALYSIS  100 mcg Intravenous Q Fri-HD  . folic acid  1 mg Oral Daily  . furosemide  120 mg Oral BID  . hydrocortisone cream   Topical BID  . labetalol  400 mg Oral BID  . pantoprazole  40 mg Oral Daily  . polyethylene glycol  17 g Oral BID  . predniSONE  40 mg Oral Q breakfast  . vitamin B-12  1,000 mcg Oral Daily      Reesa Chew  12/08/2020, 10:23 AM

## 2020-12-09 ENCOUNTER — Encounter (HOSPITAL_COMMUNITY): Payer: Self-pay | Admitting: Internal Medicine

## 2020-12-09 DIAGNOSIS — N179 Acute kidney failure, unspecified: Secondary | ICD-10-CM | POA: Diagnosis not present

## 2020-12-09 DIAGNOSIS — E8770 Fluid overload, unspecified: Secondary | ICD-10-CM | POA: Diagnosis not present

## 2020-12-09 DIAGNOSIS — D631 Anemia in chronic kidney disease: Secondary | ICD-10-CM | POA: Diagnosis not present

## 2020-12-09 DIAGNOSIS — N189 Chronic kidney disease, unspecified: Secondary | ICD-10-CM | POA: Diagnosis not present

## 2020-12-09 LAB — RENAL FUNCTION PANEL
Albumin: 1.8 g/dL — ABNORMAL LOW (ref 3.5–5.0)
Anion gap: 11 (ref 5–15)
BUN: 58 mg/dL — ABNORMAL HIGH (ref 6–20)
CO2: 25 mmol/L (ref 22–32)
Calcium: 6.7 mg/dL — ABNORMAL LOW (ref 8.9–10.3)
Chloride: 102 mmol/L (ref 98–111)
Creatinine, Ser: 5.86 mg/dL — ABNORMAL HIGH (ref 0.44–1.00)
GFR, Estimated: 9 mL/min — ABNORMAL LOW (ref >60)
Glucose, Bld: 105 mg/dL — ABNORMAL HIGH (ref 70–99)
Phosphorus: 5.5 mg/dL — ABNORMAL HIGH (ref 2.5–4.6)
Potassium: 3.7 mmol/L (ref 3.5–5.1)
Sodium: 138 mmol/L (ref 135–145)

## 2020-12-09 LAB — CBC
HCT: 21.5 % — ABNORMAL LOW (ref 36.0–46.0)
Hemoglobin: 7.1 g/dL — ABNORMAL LOW (ref 12.0–15.0)
MCH: 29.8 pg (ref 26.0–34.0)
MCHC: 33 g/dL (ref 30.0–36.0)
MCV: 90.3 fL (ref 80.0–100.0)
Platelets: 87 10*3/uL — ABNORMAL LOW (ref 150–400)
RBC: 2.38 MIL/uL — ABNORMAL LOW (ref 3.87–5.11)
RDW: 15.3 % (ref 11.5–15.5)
WBC: 6 10*3/uL (ref 4.0–10.5)
nRBC: 0 % (ref 0.0–0.2)

## 2020-12-09 NOTE — Progress Notes (Signed)
PROGRESS NOTE    TERISSA HAFFEY  [id number]:[id number] DOB: [birth year] DOA: 11/29/2020 PCP: Patient, No Pcp Per   Brief Narrative: 40 year old with past medical history significant for SLE, CKD stage III AAA/lupus nephritis, hypertension, asthma, substance abuse, bipolar disorder, depression, history of medication noncompliance presented to the ED with shortness of breath worsening edema. -Admitted with anasarca and AKI on CKD 3, Nephrotic syndrome secondary to Lupus. -Nephrology following, started on HD. Underwent Kidney Bx 1/03. 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 Normalized.   Hypertensive urgency: Continue with amlodipine and labetalol.   -P.o. Lasix  Acute on chronic anemia -Anemia panel suggestive of chronic disease -Hemoglobin down to 7.2, continue EPO, suspect a component of hemodilution as well given profoundly volume overloaded state  Lupus: Was  On prednisone taper out patient.  Received high-dose Solu-Medrol, for 3 days.  Currently on prednisone. Needs Follow-up with rheumatology.  Thrombocytopenia:  -Could be from SLE, monitor -Heparin on hold, improving  Doubt community-acquired pneumonia: -Treated with 5 days of antibiotics, now  completed  Epigastric pain: Started  PPI   Estimated body mass index is 31.97 kg/m as calculated from the following:   Height as of this encounter: 5\' 7"  (1.702 m).   Weight as of this encounter: 92.6 kg.   DVT prophylaxis: SCDs due to thrombocytopenia Code Status: Full code Family Communication: Discussed patient in detail, no family at bedside Disposition Plan:  Status is: Inpatient  Remains inpatient appropriate because:IV treatments appropriate due to intensity of illness or inability to take PO   Dispo: The patient is from: Home              Anticipated d/c is to: Home              Anticipated d/c date is: >3 days              Patient currently is not medically stable to d/c.  Consultants:   Nephrology  IR  Procedures:   HD catheter   Kidney biopsy 1/3  Antimicrobials:    Subjective: -Feels better, still planes of abdominal tightness and swelling, ambulated some yesterday Objective: Vitals:   12/08/20 1500 12/08/20 1831 12/08/20 2146 12/09/20 1031  BP: (!) 155/101 (!) 166/108 (!) 168/114 (!) 147/99  Pulse: 78 82 78 82  Resp:  20 17 20   Temp:  99.9 F (37.7 C) 98.4 F (36.9 C) 99.4 F (37.4 C)  TempSrc:  Oral Oral Oral  SpO2:  99% 98% 92%  Weight:      Height:        Intake/Output Summary (Last 24 hours) at 12/09/2020 1116 Last data filed at 12/09/2020 0900 Gross per 24 hour  Intake 1120 ml  Output 100 ml  Net 1020 ml   Filed Weights   12/06/20 1900 12/07/20 0830 12/07/20 1140  Weight: 102 kg 99 kg 92.6 kg  Examination:  General exam: Chronically ill pleasant female sitting up in bed, AAOx3, no distress HEENT: Right IJ HD catheter noted CVS: S1-S2, regular rate rhythm Lungs: Decreased breath sounds at both bases, fine basilar rails Abdomen: Soft, mildly distended, abdominal wall edema, bowel sounds present Extremities: 2-3+ edema  Psych: Flat affect   Data Reviewed: I have personally reviewed following labs and imaging  studies  CBC: Recent Labs  Lab 12/05/20 0105 12/06/20 0257 12/07/20 0503 12/08/20 0250 12/09/20 0845  WBC 6.9 6.6 6.7 5.6 6.0  HGB 7.5* 7.2* 7.2* 7.3* 7.1*  HCT 21.2* 21.5* 21.6* 21.9* 21.5*  MCV 85.1 87.0 89.3 88.0 90.3  PLT 108* 77* 68* 75* 87*   Basic Metabolic Panel: Recent Labs  Lab 12/03/20 0156 12/04/20 0701 12/05/20 0105 12/06/20 0257 12/06/20 1756 12/07/20 0503 12/08/20 0250 12/09/20 0845  NA 137 137 137 137 137 137 139 138  K 4.9 3.5 4.4 3.8 4.0 3.6 4.1 3.7  CL 104 105 103 102 101 102 103 102  CO2 17* 21* 21* 25 24 24 24 25   GLUCOSE 142* 129* 113* 149* 179* 124* 114* 105*  BUN 113* 73* 86* 53* 59* 66* 48* 58*  CREATININE 6.38* 4.45* 4.95* 4.07* 4.73* 5.21* 4.61* 5.86*  CALCIUM 6.6* 6.7* 6.7* 6.7* 6.9* 6.8* 6.9* 6.7*  PHOS 9.1* 6.8* 7.7*  --   --   --   --  5.5*   GFR: Estimated Creatinine Clearance: 15.4 mL/min (A) (by C-G formula based on SCr of 5.86 mg/dL (H)). Liver Function Tests: Recent Labs  Lab 12/04/20 0701 12/05/20 0105 12/06/20 1756 12/07/20 0503 12/09/20 0845  AST  --   --  31 20  --   ALT  --   --  32 25  --   ALKPHOS  --   --  76 61  --   BILITOT  --   --  0.6 0.3  --   PROT  --   --  4.6* 4.2*  --   ALBUMIN 2.0* 1.8* 1.9* 1.8* 1.8*   No results for input(s): LIPASE, AMYLASE in the last 168 hours. No results for input(s): AMMONIA in the last 168 hours. Coagulation Profile: Recent Labs  Lab 12/03/20 0156  INR 1.0   Cardiac Enzymes: No results for input(s): CKTOTAL, CKMB, CKMBINDEX, TROPONINI in the last 168 hours. BNP (last 3 results) No results for input(s): PROBNP in the last 8760 hours. HbA1C: No results for input(s): HGBA1C in the last 72 hours. CBG: Recent Labs  Lab 12/08/20 2144  GLUCAP 137*   Lipid Profile: No results for input(s): CHOL, HDL, LDLCALC, TRIG, CHOLHDL, LDLDIRECT in the last 72 hours. Thyroid Function Tests: No results for input(s): TSH, T4TOTAL, FREET4, T3FREE, THYROIDAB in the last 72  hours. Anemia Panel: No results for input(s): VITAMINB12, FOLATE, FERRITIN, TIBC, IRON, RETICCTPCT in the last 72 hours. Sepsis Labs: No results for input(s): PROCALCITON, LATICACIDVEN in the last 168 hours.  Recent Results (from the past 240 hour(s))  Resp Panel by RT-PCR (Flu A&B, Covid) Nasopharyngeal Swab     Status: None   Collection Time: 11/29/20  7:24 PM   Specimen: Nasopharyngeal Swab; Nasopharyngeal(NP) swabs in vial transport medium  Result Value Ref Range Status   SARS Coronavirus 2 by RT PCR NEGATIVE NEGATIVE Final    Comment: (NOTE) SARS-CoV-2 target nucleic acids are NOT DETECTED.  The SARS-CoV-2 RNA is generally detectable in upper respiratory specimens during the acute phase of infection. The lowest concentration of SARS-CoV-2 viral copies this assay  can detect is 138 copies/mL. A negative result does not preclude SARS-Cov-2 infection and should not be used as the sole basis for treatment or other patient management decisions. A negative result may occur with  improper specimen collection/handling, submission of specimen other than nasopharyngeal swab, presence of viral mutation(s) within the areas targeted by this assay, and inadequate number of viral copies(<138 copies/mL). A negative result must be combined with clinical observations, patient history, and epidemiological information. The expected result is Negative.  Fact Sheet for Patients:  EntrepreneurPulse.com.au  Fact Sheet for Healthcare Providers:  IncredibleEmployment.be  This test is no t yet approved or cleared by the [place] FDA and  has been authorized for detection and/or diagnosis of SARS-CoV-2 by FDA under an Emergency Use Authorization (EUA). This EUA will remain  in effect (meaning this test can be used) for the duration of the COVID-19 declaration under Section 564(b)(1) of the Act, 21 U.S.C.section 360bbb-3(b)(1), unless the authorization is  terminated  or revoked sooner.       Influenza A by PCR NEGATIVE NEGATIVE Final   Influenza B by PCR NEGATIVE NEGATIVE Final    Comment: (NOTE) The Xpert Xpress SARS-CoV-2/FLU/RSV plus assay is intended as an aid in the diagnosis of influenza from Nasopharyngeal swab specimens and should not be used as a sole basis for treatment. Nasal washings and aspirates are unacceptable for Xpert Xpress SARS-CoV-2/FLU/RSV testing.  Fact Sheet for Patients: EntrepreneurPulse.com.au  Fact Sheet for Healthcare Providers: IncredibleEmployment.be  This test is not yet approved or cleared by the [place] FDA and has been authorized for detection and/or diagnosis of SARS-CoV-2 by FDA under an Emergency Use Authorization (EUA). This EUA will remain in effect (meaning this test can be used) for the duration of the COVID-19 declaration under Section 564(b)(1) of the Act, 21 U.S.C. section 360bbb-3(b)(1), unless the authorization is terminated or revoked.  Performed at [facility], [place] [address], [place], [place] [address]   Culture, blood (routine x 2)     Status: None   Collection Time: 11/30/20  3:19 AM   Specimen: BLOOD LEFT ARM  Result Value Ref Range Status   Specimen Description BLOOD LEFT ARM  Final   Special Requests   Final    BOTTLES DRAWN AEROBIC AND ANAEROBIC Blood Culture results may not be optimal due to an excessive volume of blood received in culture bottles   Culture   Final    NO GROWTH 5 DAYS Performed at [facility], [place] [address], [place], [place] [address]    Report Status 12/05/2020 FINAL  Final  Culture, blood (routine x 2)     Status: None   Collection Time: 11/30/20  3:19 AM   Specimen: BLOOD  Result Value Ref Range Status   Specimen Description BLOOD RIGHT ANTECUBITAL  Final   Special Requests   Final    BOTTLES DRAWN AEROBIC AND ANAEROBIC Blood Culture results may not be optimal due to an excessive volume  of blood received in culture bottles   Culture   Final    NO GROWTH 5 DAYS Performed at [facility], [place] [address], [place], [place] [address]    Report Status 12/05/2020 FINAL  Final    Scheduled Meds: . amLODipine  10 mg Oral Daily  . Chlorhexidine Gluconate Cloth  6 each Topical Q0600  . cholecalciferol  1,000 Units Oral Daily  . darbepoetin (ARANESP) injection - DIALYSIS  100 mcg Intravenous Q Fri-HD  . folic acid  1 mg Oral Daily  . furosemide  120 mg Oral BID  . hydrocortisone cream   Topical BID  . labetalol  400 mg Oral BID  . pantoprazole  40 mg Oral Daily  . polyethylene glycol  17 g Oral BID  . predniSONE  40 mg Oral Q breakfast  . vitamin B-12  1,000 mcg Oral Daily   Continuous Infusions: . sodium chloride    . sodium chloride 250 mL (12/07/20 1307)     LOS: 10 days    Time spent: 25 minutes.   Domenic Polite, MD [facility]  12/09/2020, 11:16 AM

## 2020-12-09 NOTE — Progress Notes (Signed)
[facility] Progress Note    Assessment/ Plan:   Assessment/Recommendations: Cathy [name] is a/an 40 y.o. female with a past medical history significant for SLE, HTN, CKD, admitted for volume overload, hyperkalemia, renal failure, presumptive lupus nephritis.     Non-oliguric AKI, severe: Secondary to lupus nephritis. 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 Some chronicity present but pathologist felt to be salvageable.  -Continue dialysis MWF -s/p cytoxan 500mg  IV on 1/8; plan for q2wk x 6 doses; will need to set up outpt infusions next week -Continue oral prednisone 40mg  daily -Ppx includes: bactrim, protonix, and vit D -Will plan to initiate CLIP process as AKI with hopeful recovery -TDC placed by IR on 12/31, appreciate help -Continue Lasix 120 mg twice daily  Volume overload: likely 2/2 ARF and nephrotic syndrome. Continues to be significant but overall improved. -Dialysis and lasix as above  SLE: known lupus but no kidney biopsy due to compliance issues in the past. Labs on 11/07/20 ANA+, Anti-dsDNA 31, +anti-centromere, chromatin, smith, ro, la. SLE nephritis as above w/ plans for cytoxan. Will need to see Rheum outpatient  Hypertension: likely will improve w/ volume optimization. Steroids contributing -continue norvasc 10mg  daily, labetalol 400mg  BID -Lasix as above  Anemia due to CKD  -Transfuse for Hgb<7 g/dL; ctm - anemia workup drawn 12/04/20 - PM hgb acceptable 12/04/20 - start aranesp on HD for 12/07/20  Dispo: Pending outpatient dialysis planning  Subjective:     States she feels okay today.  Minimal nausea.  Denies headache, shortness of breath, chest pain.   Objective:   BP (!) 168/114 (BP Location: Left Arm)   Pulse 78   Temp 98.4 F (36.9 C) (Oral)   Resp 17   Ht 5\' 7"  (1.702 m)   Wt 92.6 kg   SpO2 98%   BMI 31.97 kg/m   Intake/Output Summary (Last 24 hours) at 12/09/2020 1020 Last data filed at 12/09/2020 0900 Gross  per 24 hour  Intake 1120 ml  Output 100 ml  Net 1020 ml   Weight change:   Physical Exam: Gen: NAD, lying in bed CVS: RRR Resp: clear Abd: soft but mild distention present DXA:JOINOMVEHM total body edema  ACCESS: R IJ TDC  Imaging: No results found.  Labs: BMET Recent Labs  Lab 12/03/20 0156 12/04/20 0701 12/05/20 0105 12/06/20 0257 12/06/20 1756 12/07/20 0503 12/08/20 0250 12/09/20 0845  NA 137 137 137 137 137 137 139 138  K 4.9 3.5 4.4 3.8 4.0 3.6 4.1 3.7  CL 104 105 103 102 101 102 103 102  CO2 17* 21* 21* 25 24 24 24 25   GLUCOSE 142* 129* 113* 149* 179* 124* 114* 105*  BUN 113* 73* 86* 53* 59* 66* 48* 58*  CREATININE 6.38* 4.45* 4.95* 4.07* 4.73* 5.21* 4.61* 5.86*  CALCIUM 6.6* 6.7* 6.7* 6.7* 6.9* 6.8* 6.9* 6.7*  PHOS 9.1* 6.8* 7.7*  --   --   --   --  5.5*   CBC Recent Labs  Lab 12/06/20 0257 12/07/20 0503 12/08/20 0250 12/09/20 0845  WBC 6.6 6.7 5.6 6.0  HGB 7.2* 7.2* 7.3* 7.1*  HCT 21.5* 21.6* 21.9* 21.5*  MCV 87.0 89.3 88.0 90.3  PLT 77* 68* 75* 87*    Medications:    . amLODipine  10 mg Oral Daily  . Chlorhexidine Gluconate Cloth  6 each Topical Q0600  . cholecalciferol  1,000 Units Oral Daily  . darbepoetin (ARANESP) injection - DIALYSIS  100 mcg Intravenous Q Fri-HD  .  folic acid  1 mg Oral Daily  . furosemide  120 mg Oral BID  . hydrocortisone cream   Topical BID  . labetalol  400 mg Oral BID  . pantoprazole  40 mg Oral Daily  . polyethylene glycol  17 g Oral BID  . predniSONE  40 mg Oral Q breakfast  . vitamin B-12  1,000 mcg Oral Daily      Reesa Chew  12/09/2020, 10:20 AM

## 2020-12-10 DIAGNOSIS — E875 Hyperkalemia: Secondary | ICD-10-CM | POA: Diagnosis not present

## 2020-12-10 DIAGNOSIS — N179 Acute kidney failure, unspecified: Secondary | ICD-10-CM | POA: Diagnosis not present

## 2020-12-10 DIAGNOSIS — E8770 Fluid overload, unspecified: Secondary | ICD-10-CM | POA: Diagnosis not present

## 2020-12-10 LAB — CBC
HCT: 21.7 % — ABNORMAL LOW (ref 36.0–46.0)
Hemoglobin: 7 g/dL — ABNORMAL LOW (ref 12.0–15.0)
MCH: 29.4 pg (ref 26.0–34.0)
MCHC: 32.3 g/dL (ref 30.0–36.0)
MCV: 91.2 fL (ref 80.0–100.0)
Platelets: 100 10*3/uL — ABNORMAL LOW (ref 150–400)
RBC: 2.38 MIL/uL — ABNORMAL LOW (ref 3.87–5.11)
RDW: 15.2 % (ref 11.5–15.5)
WBC: 6.8 10*3/uL (ref 4.0–10.5)
nRBC: 0 % (ref 0.0–0.2)

## 2020-12-10 LAB — RENAL FUNCTION PANEL
Albumin: 1.9 g/dL — ABNORMAL LOW (ref 3.5–5.0)
Anion gap: 11 (ref 5–15)
BUN: 70 mg/dL — ABNORMAL HIGH (ref 6–20)
CO2: 24 mmol/L (ref 22–32)
Calcium: 6.6 mg/dL — ABNORMAL LOW (ref 8.9–10.3)
Chloride: 102 mmol/L (ref 98–111)
Creatinine, Ser: 6.63 mg/dL — ABNORMAL HIGH (ref 0.44–1.00)
GFR, Estimated: 8 mL/min — ABNORMAL LOW (ref >60)
Glucose, Bld: 92 mg/dL (ref 70–99)
Phosphorus: 5.8 mg/dL — ABNORMAL HIGH (ref 2.5–4.6)
Potassium: 4.2 mmol/L (ref 3.5–5.1)
Sodium: 137 mmol/L (ref 135–145)

## 2020-12-10 MED ORDER — ACETAMINOPHEN 325 MG PO TABS
ORAL_TABLET | ORAL | Status: AC
  Filled 2020-12-10: qty 2

## 2020-12-10 MED ORDER — HEPARIN SODIUM (PORCINE) 1000 UNIT/ML IJ SOLN
INTRAMUSCULAR | Status: AC
  Administered 2020-12-10: 1000 [IU]
  Filled 2020-12-10: qty 4

## 2020-12-10 NOTE — Progress Notes (Signed)
Telephoned primary nurse Lexine Baton, RN for dialysis report. Nurse unable to provide report at this time.

## 2020-12-10 NOTE — Progress Notes (Signed)
Outpatient referral for HD clinic seat originally sent to [place], but unfortunately, this clinic cannot accommodate. There is not a clinic close to patient's home with a seat available at this time. Referral diverted to [facility] for AKI treatment.  Navigator will continue to monitor closely.   Alphonzo Cruise, [place] [facility] Navigator [phone]

## 2020-12-10 NOTE — Progress Notes (Signed)
PROGRESS NOTE    Cathy [name]  BTD:[id number] DOB: 03-Oct-1981 DOA: 11/29/2020 PCP: Patient, No Pcp Per   Brief Narrative: 40 year old with past medical history significant for SLE, CKD stage III AAA/lupus nephritis, hypertension, asthma, substance abuse, bipolar disorder, depression, history of medication noncompliance presented to the ED with shortness of breath worsening edema. -Admitted with anasarca and AKI on CKD 3, Nephrotic syndrome secondary to Lupus. -Nephrology following, started on HD. Underwent Kidney Bx 1/03.  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 Normalized.   Hypertensive urgency: Continue with amlodipine and labetalol.   -P.o. Lasix  Acute on chronic anemia -Anemia panel suggestive of chronic disease -Hemoglobin down to 7.0, continue EPO, suspect a component of hemodilution as well given profoundly volume overloaded state  Lupus: Was  On prednisone taper out patient.  Received high-dose Solu-Medrol, for 3 days.  Currently on prednisone. Needs Follow-up with rheumatology.  Thrombocytopenia:  -Could be from SLE, monitor -Heparin on hold, improving  Doubt community-acquired pneumonia: -Treated with 5 days of antibiotics, now  completed  Epigastric pain: Started  PPI   Estimated body mass index is 33.01 kg/m as calculated from the following:   Height as of this encounter: 5\' 7"  (1.702 m).   Weight as of this encounter: 95.6 kg.   DVT prophylaxis: SCDs due to thrombocytopenia Code Status: Full code Family Communication: Discussed patient in detail, no family at bedside Disposition Plan:  Status is: Inpatient  Remains inpatient appropriate because: Remains profoundly volume overloaded   Dispo: The patient is from: Home              Anticipated d/c is to: Home              Anticipated d/c date is: >3 days              Patient currently is not medically stable to d/c.  Consultants:   Nephrology  IR  Procedures:   HD catheter   Kidney biopsy 1/3  Antimicrobials:    Subjective: -Feels very short of breath this morning, coughing Objective: Vitals:   12/10/20 0845 12/10/20 0900 12/10/20 0930 12/10/20 1000  BP: (!) 183/117 (!) 196/119 (!) 188/116 (!) 196/121  Pulse:   81 75  Resp: (!) 26 (!) 27  (!) 43  Temp:      TempSrc:      SpO2:   100% 100%  Weight:      Height:        Intake/Output Summary (Last 24 hours) at 12/10/2020 1139 Last data filed at 12/09/2020 2200 Gross per 24 hour  Intake 780 ml  Output -  Net 780 ml   Filed Weights   12/07/20 0830 12/07/20 1140 12/10/20 0835  Weight: 99 kg 92.6 kg 95.6 kg    Examination:  General exam: Pleasant chronically ill female sitting up the side of the bed, in distress HEENT: Positive  JVD, right IJ HD catheter noted CVS: S1-S2, regular rate rhythm Lungs: Bilateral basilar rales Abdomen: Soft, nontender, abdominal wall edema, bowel sounds present t Extremities: 2-3+ edema  Psych: Flat affect   Data Reviewed: I have personally reviewed following labs and imaging studies  CBC: Recent Labs  Lab 12/06/20 0257 12/07/20 0503 12/08/20 0250 12/09/20 0845 12/10/20 0909  WBC 6.6 6.7 5.6 6.0 6.8  HGB 7.2* 7.2* 7.3* 7.1* 7.0*  HCT  21.5* 21.6* 21.9* 21.5* 21.7*  MCV 87.0 89.3 88.0 90.3 91.2  PLT 77* 68* 75* 87* 433*   Basic Metabolic Panel: Recent Labs  Lab 12/04/20 0701 12/05/20 0105 12/06/20 0257 12/06/20 1756 12/07/20 0503 12/08/20 0250 12/09/20 0845 12/10/20 0910  NA 137 137   < > 137 137 139 138 137  K 3.5 4.4   < > 4.0 3.6 4.1 3.7 4.2  CL 105 103   < > 101 102 103 102 102  CO2 21* 21*   < > 24 24 24 25 24   GLUCOSE 129* 113*   < > 179* 124* 114* 105* 92  BUN 73* 86*   < > 59* 66* 48* 58* 70*  CREATININE 4.45* 4.95*   < > 4.73* 5.21* 4.61* 5.86* 6.63*  CALCIUM 6.7* 6.7*   < > 6.9* 6.8* 6.9* 6.7* 6.6*  PHOS 6.8* 7.7*  --   --   --   --  5.5* 5.8*   < > = values in this interval not displayed.   GFR: Estimated Creatinine Clearance: 13.8 mL/min (A) (by C-G formula based on SCr of 6.63 mg/dL (H)). Liver Function Tests: Recent Labs  Lab 12/05/20 0105 12/06/20 1756 12/07/20 0503 12/09/20 0845 12/10/20 0910  AST  --  31 20  --   --   ALT  --  32 25  --   --   ALKPHOS  --  76 61  --   --   BILITOT  --  0.6 0.3  --   --   PROT  --  4.6* 4.2*  --   --   ALBUMIN 1.8* 1.9* 1.8* 1.8* 1.9*   No results for input(s): LIPASE, AMYLASE in the last 168 hours. No results for input(s): AMMONIA in the last 168 hours. Coagulation Profile: No results for input(s): INR, PROTIME in the last 168 hours. Cardiac Enzymes: No results for input(s): CKTOTAL, CKMB, CKMBINDEX, TROPONINI in the last 168 hours. BNP (last 3 results) No results for input(s): PROBNP in the last 8760 hours. HbA1C: No results for input(s): HGBA1C in the last 72 hours. CBG: Recent Labs  Lab 12/08/20 2144  GLUCAP 137*   Lipid Profile: No results for input(s): CHOL, HDL, LDLCALC, TRIG, CHOLHDL, LDLDIRECT in the last 72 hours. Thyroid Function Tests: No results for input(s): TSH, T4TOTAL, FREET4, T3FREE, THYROIDAB in the last 72 hours. Anemia Panel: No results for input(s): VITAMINB12, FOLATE, FERRITIN, TIBC, IRON, RETICCTPCT in the last  72 hours. Sepsis Labs: No results for input(s): PROCALCITON, LATICACIDVEN in the last 168 hours.  No results found for this or any previous visit (from the past 240 hour(s)).  Scheduled Meds: . amLODipine  10 mg Oral Daily  . Chlorhexidine Gluconate Cloth  6 each Topical Q0600  . cholecalciferol  1,000 Units Oral Daily  . darbepoetin (ARANESP) injection - DIALYSIS  100 mcg Intravenous Q Fri-HD  . folic acid  1 mg Oral Daily  . furosemide  120 mg Oral BID  . hydrocortisone cream   Topical BID  . labetalol  400 mg Oral BID  . pantoprazole  40 mg Oral Daily  . polyethylene glycol  17 g Oral BID  . predniSONE  40 mg Oral Q breakfast  . vitamin B-12  1,000 mcg Oral Daily   Continuous Infusions: . sodium chloride    . sodium chloride 250 mL (12/07/20 1307)     LOS: 11 days    Time spent: 25 minutes.   Domenic Polite, MD [facility]  12/10/2020, 11:39 AM

## 2020-12-10 NOTE — Progress Notes (Signed)
Spiked temp of 103 after HD today -tylenol, check blood Cx  Domenic Polite MD

## 2020-12-10 NOTE — Progress Notes (Signed)
[facility] Progress Note    Assessment/ Plan:   Assessment/Recommendations: Cathy [name] is a/an 40 y.o. female with a past medical history significant for SLE, HTN, CKD, admitted for volume overload, hyperkalemia, renal failure, presumptive lupus nephritis.     Non-oliguric AKI, severe: Secondary to lupus nephritis. 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 Some chronicity present but pathologist felt to be salvageable.  -Continue dialysis MWF -s/p cytoxan 500mg  IV on 1/8; plan for q2wk x 6 doses (next dose 1/22); will need to set up outpt infusions next week -Continue oral prednisone 40mg  daily -Ppx includes: bactrim, protonix, and vit D -CLIP process as AKI with hopeful recovery -TDC placed by IR on 12/31, appreciate help -Continue Lasix 120 mg twice daily  Volume overload: likely 2/2 ARF and nephrotic syndrome. Continues to be significant but overall improved. -Dialysis and lasix as above  SLE: known lupus but no kidney biopsy due to compliance issues in the past. Labs on 11/07/20 ANA+, Anti-dsDNA 31, +anti-centromere, chromatin, smith, ro, la. SLE nephritis as above w/ plans for cytoxan. Will need to see Rheum outpatient  Hypertension: likely will improve w/ volume optimization. Steroids contributing -continue norvasc 10mg  daily, labetalol 400mg  BID -Lasix as above  Anemia due to CKD  -Transfuse for Hgb<7 g/dL; ctm, r/o bleed - start aranesp on HD for 12/07/20  Dispo: Pending outpatient dialysis planning  Subjective:   Seen earlier on HD. No complaints. Patient reports that her breathing is getting better   Objective:   BP (!) 148/101 (BP Location: Left Arm)   Pulse 87   Temp (!) 100.9 F (38.3 C) (Oral)   Resp 18   Ht 5\' 7"  (1.702 m)   Wt 91.5 kg   SpO2 93%   BMI 31.59 kg/m   Intake/Output Summary (Last 24 hours) at 12/10/2020 1340 Last data filed at 12/10/2020 1300 Gross per 24 hour  Intake 810 ml  Output 4000 ml  Net -3190  ml   Weight change:   Physical Exam: Gen: NAD, lying in bed CVS: RRR Resp: clear Abd: soft but mild distention present GBT:[id number] total body edema  ACCESS: R IJ TDC  Imaging: No results found.  Labs: BMET Recent Labs  Lab 12/04/20 0701 12/05/20 0105 12/06/20 0257 12/06/20 1756 12/07/20 0503 12/08/20 0250 12/09/20 0845 12/10/20 0910  NA 137 137 137 137 137 139 138 137  K 3.5 4.4 3.8 4.0 3.6 4.1 3.7 4.2  CL 105 103 102 101 102 103 102 102  CO2 21* 21* 25 24 24 24 25 24   GLUCOSE 129* 113* 149* 179* 124* 114* 105* 92  BUN 73* 86* 53* 59* 66* 48* 58* 70*  CREATININE 4.45* 4.95* 4.07* 4.73* 5.21* 4.61* 5.86* 6.63*  CALCIUM 6.7* 6.7* 6.7* 6.9* 6.8* 6.9* 6.7* 6.6*  PHOS 6.8* 7.7*  --   --   --   --  5.5* 5.8*   CBC Recent Labs  Lab 12/07/20 0503 12/08/20 0250 12/09/20 0845 12/10/20 0909  WBC 6.7 5.6 6.0 6.8  HGB 7.2* 7.3* 7.1* 7.0*  HCT 21.6* 21.9* 21.5* 21.7*  MCV 89.3 88.0 90.3 91.2  PLT 68* 75* 87* 100*    Medications:    . amLODipine  10 mg Oral Daily  . Chlorhexidine Gluconate Cloth  6 each Topical Q0600  . cholecalciferol  1,000 Units Oral Daily  . darbepoetin (ARANESP) injection - DIALYSIS  100 mcg Intravenous Q Fri-HD  . folic acid  1 mg Oral Daily  . furosemide  120 mg Oral BID  . hydrocortisone cream   Topical BID  . labetalol  400 mg Oral BID  . pantoprazole  40 mg Oral Daily  . polyethylene glycol  17 g Oral BID  . predniSONE  40 mg Oral Q breakfast  . vitamin B-12  1,000 mcg Oral Daily      Ranisha Allaire  12/10/2020, 1:40 PM

## 2020-12-11 DIAGNOSIS — E8770 Fluid overload, unspecified: Secondary | ICD-10-CM | POA: Diagnosis not present

## 2020-12-11 DIAGNOSIS — E875 Hyperkalemia: Secondary | ICD-10-CM | POA: Diagnosis not present

## 2020-12-11 DIAGNOSIS — N179 Acute kidney failure, unspecified: Secondary | ICD-10-CM | POA: Diagnosis not present

## 2020-12-11 LAB — RENAL FUNCTION PANEL
Albumin: 1.7 g/dL — ABNORMAL LOW (ref 3.5–5.0)
Anion gap: 10 (ref 5–15)
BUN: 46 mg/dL — ABNORMAL HIGH (ref 6–20)
CO2: 27 mmol/L (ref 22–32)
Calcium: 6.7 mg/dL — ABNORMAL LOW (ref 8.9–10.3)
Chloride: 101 mmol/L (ref 98–111)
Creatinine, Ser: 5.62 mg/dL — ABNORMAL HIGH (ref 0.44–1.00)
GFR, Estimated: 9 mL/min — ABNORMAL LOW (ref >60)
Glucose, Bld: 105 mg/dL — ABNORMAL HIGH (ref 70–99)
Phosphorus: 5 mg/dL — ABNORMAL HIGH (ref 2.5–4.6)
Potassium: 4.1 mmol/L (ref 3.5–5.1)
Sodium: 138 mmol/L (ref 135–145)

## 2020-12-11 LAB — PREPARE RBC (CROSSMATCH)

## 2020-12-11 LAB — CBC
HCT: 18.4 % — ABNORMAL LOW (ref 36.0–46.0)
Hemoglobin: 6.3 g/dL — CL (ref 12.0–15.0)
MCH: 30.9 pg (ref 26.0–34.0)
MCHC: 34.2 g/dL (ref 30.0–36.0)
MCV: 90.2 fL (ref 80.0–100.0)
Platelets: 92 10*3/uL — ABNORMAL LOW (ref 150–400)
RBC: 2.04 MIL/uL — ABNORMAL LOW (ref 3.87–5.11)
RDW: 15.4 % (ref 11.5–15.5)
WBC: 4.9 10*3/uL (ref 4.0–10.5)
nRBC: 0.4 % — ABNORMAL HIGH (ref 0.0–0.2)

## 2020-12-11 MED ORDER — SODIUM CHLORIDE 0.9% IV SOLUTION: Freq: Once | INTRAVENOUS | Status: AC

## 2020-12-11 NOTE — Progress Notes (Signed)
PROGRESS NOTE    Cathy [name]  FXT:[id number] DOB: Apr 17, 1981 DOA: 11/29/2020 PCP: Patient, No Pcp Per   Brief Narrative: 40 year old with past medical history significant for SLE, CKD stage III AAA/lupus nephritis, hypertension, asthma, substance abuse, bipolar disorder, depression, history of medication noncompliance presented to the ED with shortness of breath worsening edema. -Admitted with anasarca and AKI on CKD 3, Nephrotic syndrome secondary to Lupus. -Nephrology following, started on HD. Underwent Kidney Bx 1/03.  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 Normalized.   Hypertensive urgency: Continue with amlodipine and labetalol.   -P.o. Lasix  Acute on chronic anemia -Anemia panel suggestive of chronic disease -Hemoglobin down to 7.0, continue EPO, suspect a component of hemodilution as well given profoundly volume overloaded state  Lupus: Was  On prednisone taper out patient.  Received high-dose Solu-Medrol, for 3 days.  Currently on prednisone. Needs  Follow-up with rheumatology.  Thrombocytopenia:  -Could be from SLE, monitor -Heparin on hold, improving  Doubt community-acquired pneumonia: -Suspect this is all fluid overload, regardless was treated with 5 days of antibiotics initially   GERD -Continue Protonix 80  Estimated body mass index is 31.59 kg/m as calculated from the following:   Height as of this encounter: 5\' 7"  (1.702 m).   Weight as of this encounter: 91.5 kg.   DVT prophylaxis: SCDs due to thrombocytopenia Code Status: Full code Family Communication: Discussed patient in detail, no family at bedside Disposition Plan:  Status is: Inpatient  Remains inpatient appropriate because: Remains profoundly volume overloaded   Dispo: The patient is from: Home              Anticipated d/c is to: Home              Anticipated d/c date is: >3 days              Patient currently is not medically stable to d/c.  Consultants:   Nephrology  IR  Procedures:   HD catheter   Kidney biopsy 1/3  Antimicrobials:    Subjective: -No fevers this morning, breathing better after dialysis yesterday  Objective: Vitals:   12/10/20 1332 12/10/20 1621 12/10/20 2029 12/11/20 0436  BP: (!) 148/101 123/75 (!) 131/98 (!) 148/106  Pulse: 87  74 78  Resp: 18 15 17 18   Temp: (!) 100.9 F (38.3 C) 100.3 F (37.9 C) 98.7 F (37.1 C) 99.7 F (37.6 C)  TempSrc: Oral Oral Oral Oral  SpO2: 93% 91% 92% 97%  Weight:   91.5 kg   Height:        Intake/Output Summary (Last 24 hours) at 12/11/2020 1117 Last data filed at 12/11/2020 0919 Gross per 24 hour  Intake 1140 ml  Output 4000 ml  Net -  2860 ml   Filed Weights   12/10/20 0835 12/10/20 1215 12/10/20 2029  Weight: 95.6 kg 91.5 kg 91.5 kg    Examination:  General exam: Pleasant chronically ill female sitting up in bed, awake alert oriented x3 HEENT: Positive JVD, right IJ HD catheter CVS: S1-S2, regular rate rhythm Lungs: Basilar Rales noted Abdomen: Soft, nontender,  bowel sounds present, abdominal wall edema Extremities: 2-3+ edema  Psych: Flat affect   Data Reviewed: I have personally reviewed following labs and imaging studies  CBC: Recent Labs  Lab 12/07/20 0503 12/08/20 0250 12/09/20 0845 12/10/20 0909 12/11/20 0342  WBC 6.7 5.6 6.0 6.8 4.9  HGB 7.2* 7.3* 7.1* 7.0* 6.3*  HCT 21.6* 21.9* 21.5* 21.7* 18.4*  MCV 89.3 88.0 90.3 91.2 90.2  PLT 68* 75* 87* 100* 92*   Basic Metabolic Panel: Recent Labs  Lab 12/05/20 0105 12/06/20 0257 12/07/20 0503 12/08/20 0250 12/09/20 0845 12/10/20 0910 12/11/20 0342  NA 137   < > 137 139 138 137 138  K 4.4   < > 3.6 4.1 3.7 4.2 4.1  CL 103   < > 102 103 102 102 101  CO2 21*   < > 24 24 25 24 27   GLUCOSE 113*   < > 124* 114* 105* 92 105*  BUN 86*   < > 66* 48* 58* 70* 46*  CREATININE 4.95*   < > 5.21* 4.61* 5.86* 6.63* 5.62*  CALCIUM 6.7*   < > 6.8* 6.9* 6.7* 6.6* 6.7*  PHOS 7.7*  --   --   --  5.5* 5.8* 5.0*   < > = values in this interval not displayed.   GFR: Estimated Creatinine Clearance: 15.9 mL/min (A) (by C-G formula based on SCr of 5.62 mg/dL (H)). Liver Function Tests: Recent Labs  Lab 12/06/20 1756 12/07/20 0503 12/09/20 0845 12/10/20 0910 12/11/20 0342  AST 31 20  --   --   --   ALT 32 25  --   --   --   ALKPHOS 76 61  --   --   --   BILITOT 0.6 0.3  --   --   --   PROT 4.6* 4.2*  --   --   --   ALBUMIN 1.9* 1.8* 1.8* 1.9* 1.7*   No results for input(s): LIPASE, AMYLASE in the last 168 hours. No results for input(s): AMMONIA in the last 168 hours. Coagulation Profile: No results for input(s): INR, PROTIME in the last 168 hours. Cardiac Enzymes: No results for input(s): CKTOTAL, CKMB, CKMBINDEX, TROPONINI in the last 168 hours. BNP (last 3 results) No results for input(s): PROBNP in the last 8760 hours. HbA1C: No results for input(s): HGBA1C in the last 72 hours. CBG: Recent Labs  Lab 12/08/20 2144  GLUCAP 137*   Lipid Profile: No results for input(s): CHOL,  HDL, LDLCALC, TRIG, CHOLHDL, LDLDIRECT in the last 72 hours. Thyroid Function Tests: No results for input(s): TSH, T4TOTAL, FREET4, T3FREE, THYROIDAB in the last 72 hours. Anemia Panel: No results for input(s): VITAMINB12, FOLATE, FERRITIN, TIBC, IRON, RETICCTPCT in the last 72 hours. Sepsis Labs: No results for input(s): PROCALCITON, LATICACIDVEN in the last 168 hours.  Recent Results (from the past 240 hour(s))  Culture, blood (routine x 2)     Status: None (Preliminary result)   Collection Time: 12/10/20  4:54 PM   Specimen: BLOOD LEFT FOREARM  Result Value Ref Range Status   Specimen Description BLOOD LEFT FOREARM  Final   Special Requests  Final    BOTTLES DRAWN AEROBIC AND ANAEROBIC Blood Culture adequate volume   Culture   Final    NO GROWTH < 24 HOURS Performed at [facility], [place] [address], [place], [place] [address]    Report Status PENDING  Incomplete  Culture, blood (routine x 2)     Status: None (Preliminary result)   Collection Time: 12/10/20  4:58 PM   Specimen: BLOOD  Result Value Ref Range Status   Specimen Description BLOOD RIGHT ANTECUBITAL  Final   Special Requests   Final    BOTTLES DRAWN AEROBIC AND ANAEROBIC Blood Culture adequate volume   Culture   Final    NO GROWTH < 24 HOURS Performed at [facility], [place] [address], [place], [place] [address]    Report Status PENDING  Incomplete    Scheduled Meds: . sodium chloride   Intravenous Once  . amLODipine  10 mg Oral Daily  . Chlorhexidine Gluconate Cloth  6 each Topical Q0600  . cholecalciferol  1,000 Units Oral Daily  . darbepoetin (ARANESP) injection - DIALYSIS  100 mcg Intravenous Q Fri-HD  . folic acid  1 mg Oral Daily  . furosemide  120 mg Oral BID  . hydrocortisone cream   Topical BID  . labetalol  400 mg Oral BID  . pantoprazole  40 mg Oral Daily  . polyethylene glycol  17 g Oral BID  . predniSONE  40 mg Oral Q breakfast   Continuous Infusions: . sodium chloride    . sodium  chloride 250 mL (12/07/20 1307)     LOS: 12 days    Time spent: 25 minutes.   Domenic Polite, MD [facility]  12/11/2020, 11:17 AM

## 2020-12-11 NOTE — Progress Notes (Signed)
PT Cancellation Note  Patient Details Name: Cathy [name] MRN: [id number] DOB: Feb 21, 1981   Cancelled Treatment:    Reason Eval/Treat Not Completed: Medical issues which prohibited therapy Per chart, Hgb 6.3/HCT 18.4. Not appropriate for PT this morning given these lab values. Will follow and attempt to return later if she is medically ready and if time/schedule allow.    Windell Norfolk, DPT, PN1   Supplemental Physical Therapist [facility]    Pager [phone] Acute Rehab Office [phone]

## 2020-12-11 NOTE — Progress Notes (Signed)
[name] Progress Note    Assessment/ Plan:   Assessment/Recommendations: Cathy [name] is a/an 40 y.o. female with a past medical history significant for SLE, HTN, CKD, admitted for volume overload, hyperkalemia, renal failure, presumptive lupus nephritis.     Non-oliguric AKI, severe: Secondary to lupus nephritis. 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 Some chronicity present but pathologist felt to be salvageable.  -Continue dialysis MWF -s/p cytoxan 500mg  IV on 1/8; plan for q2wk x 6 doses (next dose 1/22); will set up outpt infusions. If still here then planning on cytoxan on/around 1/22 -Continue oral prednisone 40mg  daily -Ppx includes: bactrim, protonix, and vit D -CLIP process as AKI with hopeful recovery -TDC placed by IR on 12/31, appreciate help -Continue Lasix 120 mg twice daily  Volume overload: likely 2/2 ARF and nephrotic syndrome. Continues to be significant but overall improved. -Dialysis and lasix as above  SLE: known lupus but no kidney biopsy due to compliance issues in the past. Labs on 11/07/20 ANA+, Anti-dsDNA 31, +anti-centromere, chromatin, smith, ro, Cathy. SLE nephritis as above w/ plans for cytoxan. Will need to see Rheum outpatient  Hypertension: likely will improve w/ volume optimization. Steroids contributing -continue norvasc 10mg  daily, labetalol 400mg  BID -Lasix as above  Anemia due to CKD  -Transfuse for Hgb<7 g/dL; ctm, r/o bleed - start aranesp on HD for 12/07/20   Subjective:   She reports that her breathing is much better, tolerated hd yesterday. Net uf 4L   Objective:   BP (!) 134/96 (BP Location: Left Arm)   Pulse 79   Temp 99.4 F (37.4 C) (Oral)   Resp 18   Ht 5\' 7"  (1.702 m)   Wt 91.5 kg   SpO2 93%   BMI 31.59 kg/m   Intake/Output Summary (Last 24 hours) at 12/11/2020 1346 Last data filed at 12/11/2020 0919 Gross per 24 hour  Intake 870 ml  Output 0 ml  Net 870 ml   Weight change:    Physical Exam: Gen: NAD, lying in bed CVS: RRR Resp: no iwob Abd: soft but mild distention present GGY:IRSWNIOEVO total body edema  ACCESS: R IJ TDC  Imaging: No results found.  Labs: BMET Recent Labs  Lab 12/05/20 0105 12/06/20 0257 12/06/20 1756 12/07/20 0503 12/08/20 0250 12/09/20 0845 12/10/20 0910 12/11/20 0342  NA 137 137 137 137 139 138 137 138  K 4.4 3.8 4.0 3.6 4.1 3.7 4.2 4.1  CL 103 102 101 102 103 102 102 101  CO2 21* 25 24 24 24 25 24 27   GLUCOSE 113* 149* 179* 124* 114* 105* 92 105*  BUN 86* 53* 59* 66* 48* 58* 70* 46*  CREATININE 4.95* 4.07* 4.73* 5.21* 4.61* 5.86* 6.63* 5.62*  CALCIUM 6.7* 6.7* 6.9* 6.8* 6.9* 6.7* 6.6* 6.7*  PHOS 7.7*  --   --   --   --  5.5* 5.8* 5.0*   CBC Recent Labs  Lab 12/08/20 0250 12/09/20 0845 12/10/20 0909 12/11/20 0342  WBC 5.6 6.0 6.8 4.9  HGB 7.3* 7.1* 7.0* 6.3*  HCT 21.9* 21.5* 21.7* 18.4*  MCV 88.0 90.3 91.2 90.2  PLT 75* 87* 100* 92*    Medications:    . sodium chloride   Intravenous Once  . amLODipine  10 mg Oral Daily  . Chlorhexidine Gluconate Cloth  6 each Topical Q0600  . cholecalciferol  1,000 Units Oral Daily  . darbepoetin (ARANESP) injection - DIALYSIS  100 mcg Intravenous Q Fri-HD  . folic acid  1 mg Oral Daily  . furosemide  120 mg Oral BID  . hydrocortisone cream   Topical BID  . labetalol  400 mg Oral BID  . pantoprazole  40 mg Oral Daily  . polyethylene glycol  17 g Oral BID  . predniSONE  40 mg Oral Q breakfast      Gean Quint  12/11/2020, 1:46 PM

## 2020-12-12 DIAGNOSIS — N179 Acute kidney failure, unspecified: Secondary | ICD-10-CM | POA: Diagnosis not present

## 2020-12-12 LAB — CBC
HCT: 24.4 % — ABNORMAL LOW (ref 36.0–46.0)
HCT: 23.8 % — ABNORMAL LOW (ref 36.0–46.0)
Hemoglobin: 8.2 g/dL — ABNORMAL LOW (ref 12.0–15.0)
Hemoglobin: 8.3 g/dL — ABNORMAL LOW (ref 12.0–15.0)
MCH: 30.3 pg (ref 26.0–34.0)
MCH: 30.9 pg (ref 26.0–34.0)
MCHC: 33.6 g/dL (ref 30.0–36.0)
MCHC: 34.9 g/dL (ref 30.0–36.0)
MCV: 90 fL (ref 80.0–100.0)
MCV: 88.5 fL (ref 80.0–100.0)
Platelets: 118 10*3/uL — ABNORMAL LOW (ref 150–400)
Platelets: 121 10*3/uL — ABNORMAL LOW (ref 150–400)
RBC: 2.71 MIL/uL — ABNORMAL LOW (ref 3.87–5.11)
RBC: 2.69 MIL/uL — ABNORMAL LOW (ref 3.87–5.11)
RDW: 15.1 % (ref 11.5–15.5)
RDW: 14.9 % (ref 11.5–15.5)
WBC: 7.4 10*3/uL (ref 4.0–10.5)
WBC: 4.4 10*3/uL (ref 4.0–10.5)
nRBC: 0 % (ref 0.0–0.2)
nRBC: 0 % (ref 0.0–0.2)

## 2020-12-12 LAB — RENAL FUNCTION PANEL
Albumin: 2.3 g/dL — ABNORMAL LOW (ref 3.5–5.0)
Anion gap: 11 (ref 5–15)
BUN: 40 mg/dL — ABNORMAL HIGH (ref 6–20)
CO2: 26 mmol/L (ref 22–32)
Calcium: 7.2 mg/dL — ABNORMAL LOW (ref 8.9–10.3)
Chloride: 98 mmol/L (ref 98–111)
Creatinine, Ser: 4.9 mg/dL — ABNORMAL HIGH (ref 0.44–1.00)
GFR, Estimated: 11 mL/min — ABNORMAL LOW (ref >60)
Glucose, Bld: 100 mg/dL — ABNORMAL HIGH (ref 70–99)
Phosphorus: 3.8 mg/dL (ref 2.5–4.6)
Potassium: 3.9 mmol/L (ref 3.5–5.1)
Sodium: 135 mmol/L (ref 135–145)

## 2020-12-12 LAB — TYPE AND SCREEN
ABO/RH(D): O POS
Antibody Screen: NEGATIVE
Donor AG Type: NEGATIVE
Unit division: 0

## 2020-12-12 LAB — BPAM RBC
Blood Product Expiration Date: 202201172359
ISSUE DATE / TIME: 202201111452
Unit Type and Rh: 9500

## 2020-12-12 MED ORDER — VITAMIN B-12 100 MCG PO TABS
100.0000 ug | ORAL_TABLET | Freq: Every day | ORAL | Status: DC
  Administered 2020-12-12 – 2020-12-14 (×3): 100 ug via ORAL
  Filled 2020-12-12 (×3): qty 1

## 2020-12-12 MED ORDER — TORSEMIDE 20 MG PO TABS
60.0000 mg | ORAL_TABLET | Freq: Two times a day (BID) | ORAL | Status: DC
  Administered 2020-12-12 – 2020-12-14 (×4): 60 mg via ORAL
  Filled 2020-12-12 (×5): qty 3

## 2020-12-12 MED ORDER — ALBUTEROL SULFATE HFA 108 (90 BASE) MCG/ACT IN AERS
1.0000 | INHALATION_SPRAY | Freq: Four times a day (QID) | RESPIRATORY_TRACT | Status: DC
  Administered 2020-12-12 (×2): 2 via RESPIRATORY_TRACT
  Filled 2020-12-12: qty 6.7

## 2020-12-12 NOTE — Progress Notes (Addendum)
PROGRESS NOTE    Cathy [name]  UEK:[id number] DOB: 01-Jul-1981 DOA: 11/29/2020 PCP: Patient, No Pcp Per   Brief Narrative: 40 year old with past medical history significant for SLE, CKD stage IIIa, lupus nephritis, hypertension, asthma, substance abuse, bipolar disorder, depression, history of medication noncompliance presented to the ED with shortness of breath and worsening edema.  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 -Nephrology consulted and following and started on hemodialysis.  Underwent kidney biopsy on 1/3.    Assessment & Plan:   Principal Problem:   Acute renal failure (ARF) (HCC) Active Problems:   Lupus (HCC)   Anemia   Volume overload   Hyperkalemia   1-AKI on CKD stage IIIa/lupus nephritis: Anasarca, severe hyperammonemia: -Patient baseline creatinine 2.5- 3 range.  On admission creatinine was 6.5 presented with hyperkalemia. -Nephrology consulted, underwent toenail hemodialysis placement on 12/31st, first hemodialysis treatment was 1 8/1. -She was also treated with high-dose IV Solu-Medrol and subsequently transition to prednisone. 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 -Received first dose of Cytoxan on 1/8, planning for every 2 weeks for 6 doses.  Dose 1/22.  Will arrange outpatient infusion. -Need to remain with significant volume overload requiring IV Lasix and hemodialysis. -Plan to receive hemodialysis today again  2-fever at 103 on 1/10: -Remain afebrile.  -Follow blood culture. No growth to date.   3-Hyperkalemia: Treated with  hemodialysis, Lokelma and calcium gluconate. Normalized  4-Hypertensive urgency: Continue with amlodipine and labetalol and lasix.   5-Acute on chronic anemia: Anemia panel suggestive of chronic disease. Continue with EPO./  Continue with folic acid,  will add X50 supplements. Packed red blood cells on 1/11  6-Lupus: On prednisone taper. Receive IV Solu-Medrol for 3 days. Follow-up with rheumatology  7-Leukopenia: Could be related to lupus.  Heparin on hold, improving  8-Community-acquired pneumonia: Treated for 5 days  9-GERD: Continue with Protonix 10-Asthma;  Complaining of Dyspnea this am, plan for HD and nebulizer tx.    Estimated body mass index is 31.59 kg/m as calculated from the following:   Height as of this encounter: 5\' 7"  (1.702 m).   Weight as of this encounter: 91.5 kg.   DVT prophylaxis: SCD due to thrombocytopenia Code Status: Full code Family Communication: care discussed with patient.  Disposition Plan:  Status is: Inpatient  Remains inpatient appropriate because:IV treatments appropriate due to intensity of illness or inability to take PO   Dispo: The patient is from: Home              Anticipated d/c is to: Home              Anticipated d/c date is: 2 days              Patient currently is not medically stable to d/c. requiring HD, needs clip.         Consultants:   Nephrology   Procedures:   HD  Renal Bx 1/03  Antimicrobials:    Subjective: She is complaining of SOB, awaiting to go to HD>   Objective: Vitals:   12/11/20 1823 12/11/20 2152 12/12/20 0601 12/12/20 1001  BP: (!) 159/116 (!) 168/115 (!) 166/119 (!) 168/125  Pulse: 84 77 77 82  Resp: 18  19 14   Temp: 99 F (37.2 C) 98.9 F (37.2 C) 99.5 F (37.5 C) 99.6 F (37.6 C)  TempSrc: Oral Oral Oral Oral  SpO2: 98% 97% 94% 90%  Weight:      Height:        Intake/Output Summary (Last 24 hours) at 12/12/2020 1052 Last data filed at 12/12/2020 0900 Gross per 24 hour  Intake 992 ml  Output 300 ml  Net 692 ml   Filed Weights   12/10/20 0835 12/10/20 1215 12/10/20 2029  Weight: 95.6 kg 91.5 kg 91.5 kg    Examination:  General exam: Appears calm and comfortable  Respiratory system: mild tachypnea, bilateral  wheezing.  Cardiovascular system: S1 & S2 heard, RRR. No JVD, murmurs, rubs, gallops or clicks. No pedal edema. Gastrointestinal system: Abdomen is nondistended, soft and nontender. No organomegaly or masses felt. Normal bowel sounds heard. Central nervous system: Alert and oriented. No focal neurological deficits. Extremities: Symmetric 5 x 5 power. Skin: No rashes, lesions or ulcers Psychiatry: Judgement and insight appear normal. Mood & affect appropriate.     Data Reviewed: I have personally reviewed following labs and imaging studies  CBC: Recent Labs  Lab 12/07/20 0503 12/08/20 0250 12/09/20 0845 12/10/20 0909 12/11/20 0342  WBC 6.7 5.6 6.0 6.8 4.9  HGB 7.2* 7.3* 7.1* 7.0* 6.3*  HCT 21.6* 21.9* 21.5* 21.7* 18.4*  MCV 89.3 88.0 90.3 91.2 90.2  PLT 68* 75* 87* 100* 92*   Basic Metabolic Panel: Recent Labs  Lab 12/07/20 0503 12/08/20 0250 12/09/20 0845 12/10/20 0910 12/11/20 0342  NA 137 139 138 137 138  K 3.6 4.1 3.7 4.2 4.1  CL 102 103 102 102 101  CO2 24 24 25 24 27   GLUCOSE 124* 114* 105* 92 105*  BUN 66* 48* 58* 70* 46*  CREATININE 5.21* 4.61* 5.86* 6.63* 5.62*  CALCIUM 6.8* 6.9* 6.7* 6.6* 6.7*  PHOS  --   --  5.5* 5.8* 5.0*   GFR: Estimated Creatinine Clearance: 15.9 mL/min (A) (by C-G formula based on SCr of 5.62 mg/dL (H)). Liver Function Tests: Recent Labs  Lab 12/06/20 1756 12/07/20 0503 12/09/20 0845 12/10/20 0910 12/11/20 0342  AST 31 20  --   --   --   ALT 32 25  --   --   --   ALKPHOS 76 61  --   --   --   BILITOT 0.6 0.3  --   --   --   PROT 4.6* 4.2*  --   --   --   ALBUMIN 1.9* 1.8* 1.8* 1.9* 1.7*   No results for input(s): LIPASE, AMYLASE in the last 168 hours. No results for input(s): AMMONIA in the last 168 hours. Coagulation Profile: No results for input(s): INR, PROTIME in the last 168 hours. Cardiac Enzymes: No results for input(s): CKTOTAL, CKMB, CKMBINDEX, TROPONINI in the last 168 hours. BNP (last 3 results) No results  for input(s): PROBNP in the last 8760 hours. HbA1C: No results for input(s): HGBA1C in the last 72 hours. CBG: Recent Labs  Lab 12/08/20 2144  GLUCAP 137*   Lipid Profile: No results for input(s): CHOL, HDL, LDLCALC, TRIG, CHOLHDL, LDLDIRECT in the last 72 hours. Thyroid Function Tests: No results for input(s): TSH, T4TOTAL, FREET4, T3FREE, THYROIDAB in the last 72 hours. Anemia Panel: No results for input(s): VITAMINB12, FOLATE, FERRITIN, TIBC, IRON, RETICCTPCT in the last 72 hours. Sepsis Labs: No results for input(s): PROCALCITON, LATICACIDVEN in the last 168 hours.  Recent Results (from the past 240 hour(s))  Culture, blood (routine x 2)     Status: None (Preliminary result)   Collection Time: 12/10/20  4:54 PM  Specimen: BLOOD LEFT FOREARM  Result Value Ref Range Status   Specimen Description BLOOD LEFT FOREARM  Final   Special Requests   Final    BOTTLES DRAWN AEROBIC AND ANAEROBIC Blood Culture adequate volume   Culture   Final    NO GROWTH 2 DAYS Performed at [facility], [address], [place], [place] [address]    Report Status PENDING  Incomplete  Culture, blood (routine x 2)     Status: None (Preliminary result)   Collection Time: 12/10/20  4:58 PM   Specimen: BLOOD  Result Value Ref Range Status   Specimen Description BLOOD RIGHT ANTECUBITAL  Final   Special Requests   Final    BOTTLES DRAWN AEROBIC AND ANAEROBIC Blood Culture adequate volume   Culture   Final    NO GROWTH 2 DAYS Performed at [facility], [place] [address] [place], [place] [address]    Report Status PENDING  Incomplete         Radiology Studies: No results found.      Scheduled Meds: . albuterol  1-2 puff Inhalation Q6H  . amLODipine  10 mg Oral Daily  . Chlorhexidine Gluconate Cloth  6 each Topical Q0600  . cholecalciferol  1,000 Units Oral Daily  . darbepoetin (ARANESP) injection - DIALYSIS  100 mcg Intravenous Q Fri-HD  . folic acid  1 mg Oral Daily  .  furosemide  120 mg Oral BID  . hydrocortisone cream   Topical BID  . labetalol  400 mg Oral BID  . pantoprazole  40 mg Oral Daily  . polyethylene glycol  17 g Oral BID  . predniSONE  40 mg Oral Q breakfast  . vitamin B-12  100 mcg Oral Daily   Continuous Infusions: . sodium chloride    . sodium chloride 250 mL (12/07/20 1307)     LOS: 13 days    Time spent: 35 minutes    Simran Mannis A Zadin Lange, MD [facility]   If 7PM-7AM, please contact night-coverage [email or web address]  12/12/2020, 10:52 AM

## 2020-12-12 NOTE — Plan of Care (Signed)
  Problem: Health Behavior/Discharge Planning: Goal: Ability to manage health-related needs will improve Outcome: Progressing   Problem: Clinical Measurements: Goal: Complications related to the disease process or treatment will be avoided or minimized Outcome: Progressing   Problem: Activity: Goal: Activity intolerance will improve Outcome: Progressing   Problem: Fluid Volume: Goal: Fluid volume balance will be maintained or improved Outcome: Progressing

## 2020-12-12 NOTE — Progress Notes (Signed)
PT Cancellation Note  Patient Details Name: EVERETT RICCIARDELLI MRN: [id number] DOB: Dec 09, 1980   Cancelled Treatment:    Reason Eval/Treat Not Completed: Medical issues which prohibited therapy she has had diastolic blood pressures between 119-130 all morning  making it unsafe for [place] to progress mobility, and went to HD this afternoon. Will attempt to try back when medically ready and available.    Windell Norfolk, DPT, PN1   Supplemental Physical Therapist [facility]    Pager [phone] Acute Rehab Office [phone]

## 2020-12-12 NOTE — Progress Notes (Signed)
[facility] Progress Note    Assessment/ Plan:   Assessment/Recommendations: Cathy [name] is a/an 40 y.o. female with a past medical history significant for SLE, HTN, CKD, admitted for volume overload, hyperkalemia, renal failure, presumptive lupus nephritis.     Non-oliguric AKI, severe: Secondary to lupus nephritis. 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 Some chronicity present but pathologist felt to be salvageable.  -Continue dialysis MWF but will plan for a sequential/dry UF session tomorrow for 2 hours to help with her volume status -s/p cytoxan 500mg  IV on 1/8; plan for q2wk x 6 doses (next dose 1/22); will set up outpt infusions. If still here then planning on cytoxan on/around 1/22 -Continue oral prednisone 40mg  daily -Ppx includes: bactrim, protonix, and vit D  -CLIP process as AKI with hopeful recovery -TDC placed by IR on 12/31, appreciate help -changing Lasix 120 mg twice daily to torsemide 60mg  BID 1/12  Volume overload: likely 2/2 ARF and nephrotic syndrome. Continues to be significant but overall improved. -Dialysis as above, extra treatment tomorrow (sequential) -changing lasix to torsemide for potency and nephrotic syndrome  SLE: known lupus but no kidney biopsy due to compliance issues in the past. Labs on 11/07/20 ANA+, Anti-dsDNA 31, +anti-centromere, chromatin, smith, ro, la. SLE nephritis as above w/ plans for cytoxan. Will need to see Rheum outpatient  Hypertension: likely will improve w/ volume optimization. Steroids contributing -continue norvasc 10mg  daily, labetalol 400mg  BID -Lasix as above  Anemia due to CKD  -Transfuse for Hgb<7 g/dL; ctm, recommend r/o bleed - start aranesp on HD for 12/07/20, given every Friday   Subjective:   Seen on the floor and again in HD. Breathing worsened again.  UFG 4500.    Objective:   BP (!) 180/123   Pulse 85   Temp 99.2 F (37.3 C) (Oral)   Resp (!) 22   Ht 5\' 7"  (1.702 m)    Wt 91 kg   SpO2 94%   BMI 31.42 kg/m   Intake/Output Summary (Last 24 hours) at 12/12/2020 1558 Last data filed at 12/12/2020 0900 Gross per 24 hour  Intake 752 ml  Output 300 ml  Net 452 ml   Weight change:   Physical Exam: Gen: NAD, lying in bed CVS: RRR Resp: labored Abd: soft but mild distention present [id number] total body edema  ACCESS: R IJ TDC  Imaging: No results found.  Labs: BMET Recent Labs  Lab 12/06/20 1756 12/07/20 0503 12/08/20 0250 12/09/20 0845 12/10/20 0910 12/11/20 0342 12/12/20 1449  NA 137 137 139 138 137 138 135  K 4.0 3.6 4.1 3.7 4.2 4.1 3.9  CL 101 102 103 102 102 101 98  CO2 24 24 24 25 24 27 26   GLUCOSE 179* 124* 114* 105* 92 105* 100*  BUN 59* 66* 48* 58* 70* 46* 40*  CREATININE 4.73* 5.21* 4.61* 5.86* 6.63* 5.62* 4.90*  CALCIUM 6.9* 6.8* 6.9* 6.7* 6.6* 6.7* 7.2*  PHOS  --   --   --  5.5* 5.8* 5.0* 3.8   CBC Recent Labs  Lab 12/09/20 0845 12/10/20 0909 12/11/20 0342 12/12/20 1257  WBC 6.0 6.8 4.9 7.4  HGB 7.1* 7.0* 6.3* 8.2*  HCT 21.5* 21.7* 18.4* 24.4*  MCV 90.3 91.2 90.2 90.0  PLT 87* 100* 92* 118*    Medications:    . albuterol  1-2 puff Inhalation Q6H  . amLODipine  10 mg Oral Daily  . Chlorhexidine Gluconate Cloth  6 each Topical Q0600  . cholecalciferol  1,000 Units Oral Daily  . darbepoetin (ARANESP) injection - DIALYSIS  100 mcg Intravenous Q Fri-HD  . folic acid  1 mg Oral Daily  . furosemide  120 mg Oral BID  . hydrocortisone cream   Topical BID  . labetalol  400 mg Oral BID  . pantoprazole  40 mg Oral Daily  . polyethylene glycol  17 g Oral BID  . predniSONE  40 mg Oral Q breakfast  . vitamin B-12  100 mcg Oral Daily      Gean Quint  12/12/2020, 3:58 PM

## 2020-12-12 NOTE — Progress Notes (Signed)
Patient has been accepted at [facility] on a TTS schedule with a seat time of 11:45am. She needs to arrive to her appointments at 11:25am. On her first day at the clinic, patient needs to arrive at 10:45am to complete intake paperwork prior to her first treatment. Navigator informed Nephrologist and Attending.  Navigator attempted to contact patient, but was unable to reach her at this time. Navigator will try again at a later time.   Alphonzo Cruise, [place] Renal Navigator [phone]

## 2020-12-13 DIAGNOSIS — N179 Acute kidney failure, unspecified: Secondary | ICD-10-CM | POA: Diagnosis not present

## 2020-12-13 LAB — CBC
HCT: 25.8 % — ABNORMAL LOW (ref 36.0–46.0)
Hemoglobin: 8.3 g/dL — ABNORMAL LOW (ref 12.0–15.0)
MCH: 29.2 pg (ref 26.0–34.0)
MCHC: 32.2 g/dL (ref 30.0–36.0)
MCV: 90.8 fL (ref 80.0–100.0)
Platelets: 121 10*3/uL — ABNORMAL LOW (ref 150–400)
RBC: 2.84 MIL/uL — ABNORMAL LOW (ref 3.87–5.11)
RDW: 14.7 % (ref 11.5–15.5)
WBC: 4.2 10*3/uL (ref 4.0–10.5)
nRBC: 0 % (ref 0.0–0.2)

## 2020-12-13 LAB — RENAL FUNCTION PANEL
Albumin: 1.9 g/dL — ABNORMAL LOW (ref 3.5–5.0)
Anion gap: 10 (ref 5–15)
BUN: 35 mg/dL — ABNORMAL HIGH (ref 6–20)
CO2: 27 mmol/L (ref 22–32)
Calcium: 7.1 mg/dL — ABNORMAL LOW (ref 8.9–10.3)
Chloride: 99 mmol/L (ref 98–111)
Creatinine, Ser: 4.83 mg/dL — ABNORMAL HIGH (ref 0.44–1.00)
GFR, Estimated: 11 mL/min — ABNORMAL LOW (ref >60)
Glucose, Bld: 105 mg/dL — ABNORMAL HIGH (ref 70–99)
Phosphorus: 4.4 mg/dL (ref 2.5–4.6)
Potassium: 4 mmol/L (ref 3.5–5.1)
Sodium: 136 mmol/L (ref 135–145)

## 2020-12-13 MED ORDER — ALBUTEROL SULFATE HFA 108 (90 BASE) MCG/ACT IN AERS
1.0000 | INHALATION_SPRAY | Freq: Two times a day (BID) | RESPIRATORY_TRACT | Status: DC
  Administered 2020-12-13: 2 via RESPIRATORY_TRACT
  Administered 2020-12-13 – 2020-12-14 (×2): 1 via RESPIRATORY_TRACT
  Filled 2020-12-13: qty 6.7

## 2020-12-13 MED ORDER — CLONIDINE HCL 0.1 MG PO TABS
0.1000 mg | ORAL_TABLET | Freq: Two times a day (BID) | ORAL | Status: DC
  Administered 2020-12-13: 0.1 mg via ORAL
  Filled 2020-12-13: qty 1

## 2020-12-13 MED ORDER — CLONIDINE HCL 0.1 MG PO TABS
0.1000 mg | ORAL_TABLET | Freq: Three times a day (TID) | ORAL | Status: DC
  Administered 2020-12-13 (×2): 0.1 mg via ORAL
  Filled 2020-12-13 (×2): qty 1

## 2020-12-13 MED ORDER — HEPARIN SODIUM (PORCINE) 1000 UNIT/ML IJ SOLN
INTRAMUSCULAR | Status: AC
  Filled 2020-12-13: qty 3

## 2020-12-13 MED ORDER — GUAIFENESIN ER 600 MG PO TB12
600.0000 mg | ORAL_TABLET | Freq: Two times a day (BID) | ORAL | Status: DC
  Administered 2020-12-13 – 2020-12-14 (×3): 600 mg via ORAL
  Filled 2020-12-13 (×3): qty 1

## 2020-12-13 MED ORDER — IPRATROPIUM BROMIDE HFA 17 MCG/ACT IN AERS
2.0000 | INHALATION_SPRAY | Freq: Four times a day (QID) | RESPIRATORY_TRACT | Status: DC
  Administered 2020-12-13 – 2020-12-14 (×5): 2 via RESPIRATORY_TRACT
  Filled 2020-12-13: qty 12.9

## 2020-12-13 NOTE — Plan of Care (Signed)
  Problem: Health Behavior/Discharge Planning: Goal: Ability to manage health-related needs will improve Outcome: Progressing   Problem: Clinical Measurements: Goal: Complications related to the disease process or treatment will be avoided or minimized Outcome: Progressing   Problem: Activity: Goal: Activity intolerance will improve Outcome: Progressing   Problem: Fluid Volume: Goal: Fluid volume balance will be maintained or improved Outcome: Progressing

## 2020-12-13 NOTE — Progress Notes (Signed)
PROGRESS NOTE    JRUE YAMBAO  YKD:[id number] DOB: Mar 16, 1981 DOA: 11/29/2020 PCP: Patient, No Pcp Per   Brief Narrative: 40 year old with past medical history significant for SLE, CKD stage IIIa, lupus nephritis, hypertension, asthma, substance abuse, bipolar disorder, depression, history of medication noncompliance presented to the ED with shortness of breath and worsening edema.  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 -Nephrology consulted and following and started on hemodialysis.  Underwent kidney biopsy on 1/3.    Assessment & Plan:   Principal Problem:   Acute renal failure (ARF) (HCC) Active Problems:   Lupus (HCC)   Anemia   Volume overload   Hyperkalemia   1-AKI on CKD stage IIIa/lupus nephritis: Anasarca, severe hyperammonemia: -Patient baseline creatinine 2.5- 3 range.  On admission creatinine was 6.5 presented with hyperkalemia. -Nephrology consulted, underwent toenail hemodialysis placement on 12/31st, first hemodialysis treatment was 1 8/1. -She was also treated with high-dose IV Solu-Medrol and subsequently transition to prednisone. 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 -Received first dose of Cytoxan on 1/8, planning for every 2 weeks for 6 doses.  Dose 1/22.  Will arrange outpatient infusion. -Need to remain with significant volume overload requiring IV Lasix and hemodialysis. -Plan to receive hemodialysis today, had HD yesterday.   2-fever at 103 on 1/10: -Remain afebrile.  -Follow blood culture. No growth to date.   3-Hyperkalemia: Treated with  hemodialysis, Lokelma and calcium gluconate. Normalized  4-Hypertensive urgency: Continue with amlodipine and labetalol and lasix.  Added clonidine, TID.   5-Acute on chronic anemia: Anemia panel suggestive of chronic disease. Continue  with EPO./  Continue with folic acid, will add L97 supplements. Packed red blood cells on 1/11  6-Lupus: On prednisone taper. Receive IV Solu-Medrol for 3 days. Follow-up with rheumatology  7-Leukopenia: Could be related to lupus.  Heparin on hold, improving  8-Community-acquired pneumonia: Treated for 5 days  9-GERD: Continue with Protonix 10-Asthma;  Complaining of Dyspnea, improved after inhaler and HD.  Will add ipratropium today.   Estimated body mass index is 30.04 kg/m as calculated from the following:   Height as of this encounter: 5\' 7"  (1.702 m).   Weight as of this encounter: 87 kg.   DVT prophylaxis: SCD due to thrombocytopenia Code Status: Full code Family Communication: care discussed with patient.  Disposition Plan:  Status is: Inpatient  Remains inpatient appropriate because:IV treatments appropriate due to intensity of illness or inability to take PO   Dispo: The patient is from: Home              Anticipated d/c is to: Home              Anticipated d/c date is: 1 day              Patient currently is not medically stable to d/c. home in 24 hour if BP better controlled and Dyspnea improved.         Consultants:   Nephrology   Procedures:   HD  Renal Bx 1/03  Antimicrobials:    Subjective: She report breathing better. Still SOB.  Feels inhaler help a lot.  Denies chest pain.   Objective: Vitals:   12/12/20 1809 12/12/20 2057 12/13/20 0500 12/13/20 0807  BP: (!) 163/120 (!) 150/105 (!) 172/102   Pulse: 79 78 77   Resp: 20 17 19    Temp: 99.1 F (  37.3 C) 99.6 F (37.6 C) 99.2 F (37.3 C)   TempSrc: Oral Oral Oral   SpO2: 92% 95% 99% 96%  Weight:      Height:        Intake/Output Summary (Last 24 hours) at 12/13/2020 0812 Last data filed at 12/12/2020 2200 Gross per 24 hour  Intake 840 ml  Output 4119 ml  Net -3279 ml   Filed Weights   12/10/20 2029 12/12/20 1235 12/12/20 1700  Weight: 91.5 kg 91 kg 87 kg     Examination:  General exam: NAD Respiratory system: No wheezing, BL crackles.  Cardiovascular system: S 1, S 2 RRR Gastrointestinal system: BS present, soft, nt Central nervous system: alert, non focal.  Extremities: Symmetric power Skin:no rash  Data Reviewed: I have personally reviewed following labs and imaging studies  CBC: Recent Labs  Lab 12/10/20 0909 12/11/20 0342 12/12/20 1257 12/12/20 2244 12/13/20 0328  WBC 6.8 4.9 7.4 4.4 4.2  HGB 7.0* 6.3* 8.2* 8.3* 8.3*  HCT 21.7* 18.4* 24.4* 23.8* 25.8*  MCV 91.2 90.2 90.0 88.5 90.8  PLT 100* 92* 118* 121* 440*   Basic Metabolic Panel: Recent Labs  Lab 12/09/20 0845 12/10/20 0910 12/11/20 0342 12/12/20 1449 12/13/20 0328  NA 138 137 138 135 136  K 3.7 4.2 4.1 3.9 4.0  CL 102 102 101 98 99  CO2 25 24 27 26 27   GLUCOSE 105* 92 105* 100* 105*  BUN 58* 70* 46* 40* 35*  CREATININE 5.86* 6.63* 5.62* 4.90* 4.83*  CALCIUM 6.7* 6.6* 6.7* 7.2* 7.1*  PHOS 5.5* 5.8* 5.0* 3.8 4.4   GFR: Estimated Creatinine Clearance: 18.1 mL/min (A) (by C-G formula based on SCr of 4.83 mg/dL (H)). Liver Function Tests: Recent Labs  Lab 12/06/20 1756 12/07/20 0503 12/09/20 0845 12/10/20 0910 12/11/20 0342 12/12/20 1449 12/13/20 0328  AST 31 20  --   --   --   --   --   ALT 32 25  --   --   --   --   --   ALKPHOS 76 61  --   --   --   --   --   BILITOT 0.6 0.3  --   --   --   --   --   PROT 4.6* 4.2*  --   --   --   --   --   ALBUMIN 1.9* 1.8* 1.8* 1.9* 1.7* 2.3* 1.9*   No results for input(s): LIPASE, AMYLASE in the last 168 hours. No results for input(s): AMMONIA in the last 168 hours. Coagulation Profile: No results for input(s): INR, PROTIME in the last 168 hours. Cardiac Enzymes: No results for input(s): CKTOTAL, CKMB, CKMBINDEX, TROPONINI in the last 168 hours. BNP (last 3 results) No results for input(s): PROBNP in the last 8760 hours. HbA1C: No results for input(s): HGBA1C in the last 72 hours. CBG: Recent Labs   Lab 12/08/20 2144  GLUCAP 137*   Lipid Profile: No results for input(s): CHOL, HDL, LDLCALC, TRIG, CHOLHDL, LDLDIRECT in the last 72 hours. Thyroid Function Tests: No results for input(s): TSH, T4TOTAL, FREET4, T3FREE, THYROIDAB in the last 72 hours. Anemia Panel: No results for input(s): VITAMINB12, FOLATE, FERRITIN, TIBC, IRON, RETICCTPCT in the last 72 hours. Sepsis Labs: No results for input(s): PROCALCITON, LATICACIDVEN in the last 168 hours.  Recent Results (from the past 240 hour(s))  Culture, blood (routine x 2)     Status: None (Preliminary result)   Collection Time: 12/10/20  4:54  PM   Specimen: BLOOD LEFT FOREARM  Result Value Ref Range Status   Specimen Description BLOOD LEFT FOREARM  Final   Special Requests   Final    BOTTLES DRAWN AEROBIC AND ANAEROBIC Blood Culture adequate volume   Culture   Final    NO GROWTH 3 DAYS Performed at [facility], [address] [place], [place] [address]    Report Status PENDING  Incomplete  Culture, blood (routine x 2)     Status: None (Preliminary result)   Collection Time: 12/10/20  4:58 PM   Specimen: BLOOD  Result Value Ref Range Status   Specimen Description BLOOD RIGHT ANTECUBITAL  Final   Special Requests   Final    BOTTLES DRAWN AEROBIC AND ANAEROBIC Blood Culture adequate volume   Culture   Final    NO GROWTH 3 DAYS Performed at [facility], [place] [address] [place], [place] [address]    Report Status PENDING  Incomplete         Radiology Studies: No results found.      Scheduled Meds: . albuterol  1-2 puff Inhalation BID  . amLODipine  10 mg Oral Daily  . Chlorhexidine Gluconate Cloth  6 each Topical Q0600  . cholecalciferol  1,000 Units Oral Daily  . cloNIDine  0.1 mg Oral BID  . darbepoetin (ARANESP) injection - DIALYSIS  100 mcg Intravenous Q Fri-HD  . folic acid  1 mg Oral Daily  . guaiFENesin  600 mg Oral BID  . hydrocortisone cream   Topical BID  . ipratropium  2 puff  Inhalation Q6H  . labetalol  400 mg Oral BID  . pantoprazole  40 mg Oral Daily  . polyethylene glycol  17 g Oral BID  . predniSONE  40 mg Oral Q breakfast  . torsemide  60 mg Oral BID  . vitamin B-12  100 mcg Oral Daily   Continuous Infusions: . sodium chloride    . sodium chloride 250 mL (12/07/20 1307)     LOS: 14 days    Time spent: 35 minutes    Barkley Kratochvil A Kevontae Burgoon, MD [facility]   If 7PM-7AM, please contact night-coverage [email or web address]  12/13/2020, 8:12 AM

## 2020-12-13 NOTE — Progress Notes (Signed)
PT Cancellation Note  Patient Details Name: Cathy [name] MRN: [id number] DOB: 1981/11/03   Cancelled Treatment:    Reason Eval/Treat Not Completed: Medical issues which prohibited therapy diastolic BPs still quite elevated- checked back multiple times through the day however unable to safely work with her due to elevated BP, then she left for HD. Will continue efforts.    Windell Norfolk, DPT, PN1   Supplemental Physical Therapist [facility]    Pager [phone] Acute Rehab Office [phone]

## 2020-12-13 NOTE — Plan of Care (Signed)
  Problem: Health Behavior/Discharge Planning: Goal: Ability to manage health-related needs will improve Outcome: Progressing   

## 2020-12-13 NOTE — Progress Notes (Signed)
[facility] Progress Note    Assessment/ Plan:   Assessment/Recommendations: Cathy [name] is a/an 40 y.o. female with a past medical history significant for SLE, HTN, CKD, admitted for volume overload, hyperkalemia, renal failure, presumptive lupus nephritis.     Non-oliguric AKI, severe: Secondary to lupus nephritis. 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 Some chronicity present but pathologist felt to be salvageable.  -Continue dialysis MWF here, has a TTS chair. Plan is to do a sequential treatment today and then regular dialysis tomorrow (if she's still here). If she is discharged today, then can just do HD at her unit on tuesday -s/p cytoxan 500mg  IV on 1/8; plan for q2wk x 6 doses total (second dose should be around 1/22); will set up outpt infusions, contacted our office. If still here then planning on cytoxan on/around 1/22 (most likely mon 1/24 on an off-hd day) -Continue oral prednisone 40mg  daily -Ppx includes: bactrim, protonix, and vit D  -CLIP process as AKI with hopeful recovery--[facility] TTS -TDC placed by IR on 12/31, appreciate help -changed Lasix 120 mg twice daily to torsemide 60mg  BID 1/12  Volume overload: likely 2/2 ARF and nephrotic syndrome. Continues to be significant but overall improved. -Dialysis as above, extra treatment tomorrow (sequential) -changing lasix to torsemide for potency and nephrotic syndrome  SLE: known lupus but no kidney biopsy due to compliance issues in the past. Labs on 11/07/20 ANA+, Anti-dsDNA 31, +anti-centromere, chromatin, smith, ro, la. SLE nephritis as above w/ plans for cytoxan. Will need to see Rheum outpatient  Hypertension: likely will improve w/ volume optimization. Steroids contributing -continue norvasc 10mg  daily, labetalol 400mg  BID -Lasix as above  Anemia due to CKD  -Transfuse for Hgb<7 g/dL; ctm, recommend r/o bleed - start aranesp on HD for 12/07/20, given every  Friday   Subjective:   Seen on the floor and again in HD. Breathing worsened again.  UFG 4500.    Objective:   BP (!) 158/115 (BP Location: Left Arm)   Pulse 79   Temp 98.6 F (37 C) (Oral)   Resp 20   Ht 5\' 7"  (1.702 m)   Wt 87 kg   SpO2 93%   BMI 30.04 kg/m   Intake/Output Summary (Last 24 hours) at 12/13/2020 1228 Last data filed at 12/13/2020 0800 Gross per 24 hour  Intake 1080 ml  Output 4118 ml  Net -3038 ml   Weight change:   Physical Exam: Gen: NAD, lying in bed CVS: RRR Resp: labored Abd: soft but mild distention present [id number] total body edema  ACCESS: R IJ TDC  Imaging: No results found.  Labs: BMET Recent Labs  Lab 12/07/20 0503 12/08/20 0250 12/09/20 0845 12/10/20 0910 12/11/20 0342 12/12/20 1449 12/13/20 0328  NA 137 139 138 137 138 135 136  K 3.6 4.1 3.7 4.2 4.1 3.9 4.0  CL 102 103 102 102 101 98 99  CO2 24 24 25 24 27 26 27   GLUCOSE 124* 114* 105* 92 105* 100* 105*  BUN 66* 48* 58* 70* 46* 40* 35*  CREATININE 5.21* 4.61* 5.86* 6.63* 5.62* 4.90* 4.83*  CALCIUM 6.8* 6.9* 6.7* 6.6* 6.7* 7.2* 7.1*  PHOS  --   --  5.5* 5.8* 5.0* 3.8 4.4   CBC Recent Labs  Lab 12/11/20 0342 12/12/20 1257 12/12/20 2244 12/13/20 0328  WBC 4.9 7.4 4.4 4.2  HGB 6.3* 8.2* 8.3* 8.3*  HCT 18.4* 24.4* 23.8* 25.8*  MCV 90.2 90.0 88.5 90.8  PLT 92* 118*  121* 121*    Medications:    . albuterol  1-2 puff Inhalation BID  . amLODipine  10 mg Oral Daily  . Chlorhexidine Gluconate Cloth  6 each Topical Q0600  . cholecalciferol  1,000 Units Oral Daily  . cloNIDine  0.1 mg Oral BID  . darbepoetin (ARANESP) injection - DIALYSIS  100 mcg Intravenous Q Fri-HD  . folic acid  1 mg Oral Daily  . guaiFENesin  600 mg Oral BID  . hydrocortisone cream   Topical BID  . ipratropium  2 puff Inhalation Q6H  . labetalol  400 mg Oral BID  . pantoprazole  40 mg Oral Daily  . polyethylene glycol  17 g Oral BID  . predniSONE  40 mg Oral Q breakfast  . torsemide  60  mg Oral BID  . vitamin B-12  100 mcg Oral Daily      Gean Quint  12/13/2020, 12:28 PM

## 2020-12-13 NOTE — Progress Notes (Signed)
Renal Navigator received call back from patient who was pleasant and appreciative. She states she is feeling much better today since having HD tx. Information given to patient regarding outpatient HD clinic and schedule (see previous note from 12/12/20) and discussion had about importance of attending all treatments. Patient is very appreciative of clinic information and states willingness to comply with plan. She states she knows where [place] [facility] is and that she has her own transportation. She is hopeful for discharge soon and understands that if she is started in the clinic on Saturday, that she will need to sign papers on Friday by 4:00pm.  Navigator will continue to follow and will document final plan in her AVS when it has been determined.  Alphonzo Cruise, [place] Renal Navigator [phone]

## 2020-12-14 DIAGNOSIS — N2581 Secondary hyperparathyroidism of renal origin: Secondary | ICD-10-CM | POA: Insufficient documentation

## 2020-12-14 DIAGNOSIS — D509 Iron deficiency anemia, unspecified: Secondary | ICD-10-CM | POA: Insufficient documentation

## 2020-12-14 DIAGNOSIS — M329 Systemic lupus erythematosus, unspecified: Secondary | ICD-10-CM | POA: Insufficient documentation

## 2020-12-14 DIAGNOSIS — N179 Acute kidney failure, unspecified: Secondary | ICD-10-CM | POA: Diagnosis not present

## 2020-12-14 DIAGNOSIS — F419 Anxiety disorder, unspecified: Secondary | ICD-10-CM | POA: Insufficient documentation

## 2020-12-14 MED ORDER — VITAMIN D3 25 MCG PO TABS: 1000.0000 [IU] | ORAL_TABLET | Freq: Every day | ORAL | 0 refills | Status: DC

## 2020-12-14 MED ORDER — DARBEPOETIN ALFA 100 MCG/0.5ML IJ SOSY: 100.0000 ug | PREFILLED_SYRINGE | INTRAMUSCULAR | Status: DC

## 2020-12-14 MED ORDER — PREDNISONE 20 MG PO TABS: 40.0000 mg | ORAL_TABLET | Freq: Every day | ORAL | 3 refills | Status: DC

## 2020-12-14 MED ORDER — IPRATROPIUM BROMIDE HFA 17 MCG/ACT IN AERS: 2.0000 | INHALATION_SPRAY | Freq: Four times a day (QID) | RESPIRATORY_TRACT | 12 refills | Status: DC

## 2020-12-14 MED ORDER — CYANOCOBALAMIN 100 MCG PO TABS: 100.0000 ug | ORAL_TABLET | Freq: Every day | ORAL | 1 refills | Status: DC

## 2020-12-14 MED ORDER — LABETALOL HCL 200 MG PO TABS: 400.0000 mg | ORAL_TABLET | Freq: Two times a day (BID) | ORAL | 3 refills | Status: DC

## 2020-12-14 MED ORDER — TORSEMIDE 20 MG PO TABS: 60.0000 mg | ORAL_TABLET | Freq: Two times a day (BID) | ORAL | 2 refills | Status: DC

## 2020-12-14 MED ORDER — FOLIC ACID 1 MG PO TABS: 1.0000 mg | ORAL_TABLET | Freq: Every day | ORAL | 1 refills | Status: DC

## 2020-12-14 MED ORDER — AMLODIPINE BESYLATE 10 MG PO TABS: 10.0000 mg | ORAL_TABLET | Freq: Every day | ORAL | 1 refills | Status: DC

## 2020-12-14 MED ORDER — ALBUTEROL SULFATE HFA 108 (90 BASE) MCG/ACT IN AERS: 1.0000 | INHALATION_SPRAY | Freq: Two times a day (BID) | RESPIRATORY_TRACT | 2 refills | Status: DC

## 2020-12-14 MED ORDER — GUAIFENESIN ER 600 MG PO TB12: 600.0000 mg | ORAL_TABLET | Freq: Two times a day (BID) | ORAL | 0 refills | Status: DC

## 2020-12-14 MED ORDER — PANTOPRAZOLE SODIUM 40 MG PO TBEC: 40.0000 mg | DELAYED_RELEASE_TABLET | Freq: Every day | ORAL | 1 refills | Status: DC

## 2020-12-14 MED ORDER — CLONIDINE HCL 0.2 MG PO TABS
0.2000 mg | ORAL_TABLET | Freq: Three times a day (TID) | ORAL | Status: DC
  Administered 2020-12-14: 0.2 mg via ORAL
  Filled 2020-12-14: qty 1

## 2020-12-14 MED ORDER — CLONIDINE HCL 0.2 MG PO TABS
0.2000 mg | ORAL_TABLET | Freq: Three times a day (TID) | ORAL | 2 refills | Status: DC
Start: 2020-12-14 — End: 2020-12-31

## 2020-12-14 NOTE — Discharge Summary (Signed)
Physician Discharge Summary  Cathy [name] FIE:[id number] DOB: 1981-10-24 DOA: 11/29/2020  PCP: Patient, No Pcp Per  Admit date: 11/29/2020 Discharge date: 12/14/2020  Admitted From: Home  Disposition:  Home   Recommendations for Outpatient Follow-up:  1. Follow up with PCP in 1-2 weeks 2. Please obtain BMP/CBC in one week 3. Needs to follow up with Rheumatology 4. Needs to follow up with nephrology for cytoxin infusion.   Discharge Condition: Stable.  CODE STATUS: full code.  Diet recommendation: Heart Healthy  Brief/Interim Summary: 40 year old with past medical history significant for SLE, CKD stage IIIa, lupus nephritis, hypertension, asthma, substance abuse, bipolar disorder, depression, history of medication noncompliance presented to the ED with shortness of breath and worsening edema.  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 -Nephrology consulted and following and started on hemodialysis.  Underwent kidney biopsy on 1/3.   1-AKI on CKD stage IIIa/lupus nephritis: Anasarca, severe hyperammonemia: -Patient baseline creatinine 2.5- 3 range.  On admission creatinine was 6.5 presented with hyperkalemia. -Nephrology consulted, underwent toenail hemodialysis placement on 12/31st, first hemodialysis treatment was 1 8/1. -She was also treated with high-dose IV Solu-Medrol and subsequently transition to prednisone. 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 -Received first dose of Cytoxan on 1/8, planning for every 2 weeks for 6 doses.  Dose 1/22.  Will arrange outpatient infusion. -Need to remain with significant volume overload requiring IV Lasix and hemodialysis. -plan to discharge today.   2-fever at 103 on 1/10: -Remain afebrile.  -Follow blood culture. No growth to date.   3-Hyperkalemia: Treated with   hemodialysis, Lokelma and calcium gluconate. Normalized.  4-Hypertensive urgency: Continue with amlodipine and labetalol and lasix.  Continue with clonidine, TID. increase dose to 0.2   5-Acute on chronic anemia: Anemia panel suggestive of chronic disease. Continue with EPO./  Continue with folic acid,  Z66 supplements. Packed red blood cells on 1/11  6-Lupus: On prednisone taper. Receive IV Solu-Medrol for 3 days. Follow-up with rheumatology. Patient has an appointment next month  7-Leukopenia: Could be related to lupus.  Heparin on hold, improving  8-Community-acquired pneumonia: Treated for 5 days  9-GERD: Continue with Protonix 10-Asthma;  Complaining of Dyspnea, improved after inhaler and HD.  Continue with ipratropium.  Breathing better.   Estimated body mass index is 30.04 kg/m as calculated from the following:   Height as of this encounter: 5' 7" (1.702 m).   Weight as of this encounter: 87 kg.  Discharge Diagnoses:  Principal Problem:   Acute renal failure (ARF) (HCC) Active Problems:   Lupus (HCC)   Anemia   Volume overload   Hyperkalemia    Discharge Instructions  Discharge Instructions    Diet - low sodium heart healthy   Complete by: As directed    Increase activity slowly   Complete by: As directed    No wound care   Complete by: As directed      Allergies as of 12/14/2020   No Known Allergies     Medication List    STOP taking these medications   famotidine 10 MG tablet Commonly known as: PEPCID   furosemide 40 MG tablet Commonly known as: Lasix   sodium bicarbonate 650 MG tablet     TAKE these medications   albuterol 108 (90 Base) MCG/ACT inhaler Commonly known as: ProAir HFA Inhale 1-2 puffs into the lungs 2 (two) times daily.  What changed:   how much to take  when to take this  reasons to take this   amLODipine 10 MG tablet Commonly known as: NORVASC Take 1 tablet (10 mg total) by mouth daily.   cloNIDine 0.2  MG tablet Commonly known as: CATAPRES Take 1 tablet (0.2 mg total) by mouth 3 (three) times daily.   cyanocobalamin 100 MCG tablet Take 1 tablet (100 mcg total) by mouth daily.   fenofibrate 160 MG tablet Take 1 tablet (160 mg total) by mouth daily.   folic acid 1 MG tablet Commonly known as: FOLVITE Take 1 tablet (1 mg total) by mouth daily.   guaiFENesin 600 MG 12 hr tablet Commonly known as: MUCINEX Take 1 tablet (600 mg total) by mouth 2 (two) times daily.   hydrocortisone cream 1 % Apply topically 2 (two) times daily.   ipratropium 17 MCG/ACT inhaler Commonly known as: ATROVENT HFA Inhale 2 puffs into the lungs every 6 (six) hours.   labetalol 200 MG tablet Commonly known as: NORMODYNE Take 2 tablets (400 mg total) by mouth 2 (two) times daily.   pantoprazole 40 MG tablet Commonly known as: PROTONIX Take 1 tablet (40 mg total) by mouth daily.   polyethylene glycol 17 g packet Commonly known as: MIRALAX / GLYCOLAX Take 17 g by mouth daily as needed for mild constipation. What changed: reasons to take this   predniSONE 20 MG tablet Commonly known as: DELTASONE Take 2 tablets (40 mg total) by mouth daily with breakfast. What changed:   medication strength  how much to take  additional instructions   torsemide 20 MG tablet Commonly known as: DEMADEX Take 3 tablets (60 mg total) by mouth 2 (two) times daily.   Vitamin D3 25 MCG tablet Commonly known as: Vitamin D Take 1 tablet (1,000 Units total) by mouth daily.       [facility] Follow up on 12/14/2020.   Why: Go today to sign paperwork before 4pm. Go tomorrow, Saturday, 12/15/20 for first treatment at 11:25am. Your schedule is every Tuesday, Thursday and Saturday with arrival at 11:25am for 11:45am treatment time.  Contact information: [place] [address] [phone]        Lelon Perla, MD Follow up in 3 week(s).   Specialty:  Cardiology Contact information: [address] [place] [address] [phone]              No Known Allergies  Consultations:  Nephrology     Procedures/Studies: US RENAL  Result Date: 11/30/2020 CLINICAL DATA:  Acute kidney injury EXAM: RENAL / URINARY TRACT ULTRASOUND COMPLETE COMPARISON:  None. FINDINGS: Right Kidney: Renal measurements: 11.8 x 7.2 x 5.7 cm = volume: 256 mL. Echogenicity within normal limits. Mild right pelviectasis is seen. Left Kidney: Renal measurements: 10.1 x 5.9 x 4.3 cm = volume: 161 mL. Echogenicity within normal limits. No mass or hydronephrosis visualized. Bladder: Appears normal for degree of bladder distention. Other: Abdominopelvic ascites is noted. Bilateral pleural effusions are seen. IMPRESSION: Nonspecific mild right pelviectasis. Abdominopelvic ascites Small bilateral pleural effusions. Electronically Signed   By: Prudencio Pair M.D.   On: 11/30/2020 01:20   IR Fluoro Guide CV Line Right  Result Date: 12/01/2020 INDICATION: 40 year old female with lupus nephritis in need of hemodialysis. EXAM: TUNNELED CENTRAL VENOUS HEMODIALYSIS CATHETER PLACEMENT WITH ULTRASOUND AND FLUOROSCOPIC GUIDANCE MEDICATIONS: None. ANESTHESIA/SEDATION: Moderate (conscious) sedation was employed during this procedure. A total of Versed 2 mg  and Fentanyl 100 mcg was administered intravenously. Moderate Sedation Time: 27 minutes. The patient's level of consciousness and vital signs were monitored continuously by radiology nursing throughout the procedure under my direct supervision. FLUOROSCOPY TIME:  Fluoroscopy Time: 1 minutes 48 seconds (7 mGy). COMPLICATIONS: None immediate. PROCEDURE: Informed written consent was obtained from the patient after a discussion of the risks, benefits, and alternatives to treatment. Questions regarding the procedure were encouraged and answered. The right neck and chest were prepped with chlorhexidine in a sterile fashion, and a  sterile drape was applied covering the operative field. Maximum barrier sterile technique with sterile gowns and gloves were used for the procedure. A timeout was performed prior to the initiation of the procedure. After creating a small venotomy incision, a micropuncture kit was utilized to access the right internal jugular vein under direct, real-time ultrasound guidance after the overlying soft tissues were anesthetized with 1% lidocaine with epinephrine. Ultrasound image documentation was performed. The microwire was kinked to measure appropriate catheter length. A stiff Glidewire was advanced to the level of the IVC and the micropuncture sheath was exchanged for a peel-away sheath. A tunneled hemodialysis catheter measuring 19 cm from tip to cuff was tunneled in a retrograde fashion from the anterior chest wall to the venotomy incision. The catheter was then placed through the peel-away sheath with tips ultimately positioned within the superior aspect of the right atrium. Final catheter positioning was confirmed and documented with a spot radiographic image. The catheter aspirates and flushes normally. The catheter was flushed with appropriate volume heparin dwells. The catheter exit site was secured with a 0-Prolene retention suture. The venotomy incision was closed with an interrupted 4-0 Vicryl, Dermabond and Steri-strips. Dressings were applied. The patient tolerated the procedure well without immediate post procedural complication. IMPRESSION: Successful placement of 19 cm tip to cuff tunneled hemodialysis catheter via the right internal jugular vein with tips terminating within the superior aspect of the right atrium. The catheter is ready for immediate use. Electronically Signed   By: Jacqulynn Cadet M.D.   On: 12/01/2020 08:39   IR US Guide Vasc Access Right  Result Date: 12/01/2020 INDICATION: 40 year old female with lupus nephritis in need of hemodialysis. EXAM: TUNNELED CENTRAL VENOUS  HEMODIALYSIS CATHETER PLACEMENT WITH ULTRASOUND AND FLUOROSCOPIC GUIDANCE MEDICATIONS: None. ANESTHESIA/SEDATION: Moderate (conscious) sedation was employed during this procedure. A total of Versed 2 mg and Fentanyl 100 mcg was administered intravenously. Moderate Sedation Time: 27 minutes. The patient's level of consciousness and vital signs were monitored continuously by radiology nursing throughout the procedure under my direct supervision. FLUOROSCOPY TIME:  Fluoroscopy Time: 1 minutes 48 seconds (7 mGy). COMPLICATIONS: None immediate. PROCEDURE: Informed written consent was obtained from the patient after a discussion of the risks, benefits, and alternatives to treatment. Questions regarding the procedure were encouraged and answered. The right neck and chest were prepped with chlorhexidine in a sterile fashion, and a sterile drape was applied covering the operative field. Maximum barrier sterile technique with sterile gowns and gloves were used for the procedure. A timeout was performed prior to the initiation of the procedure. After creating a small venotomy incision, a micropuncture kit was utilized to access the right internal jugular vein under direct, real-time ultrasound guidance after the overlying soft tissues were anesthetized with 1% lidocaine with epinephrine. Ultrasound image documentation was performed. The microwire was kinked to measure appropriate catheter length. A stiff Glidewire was advanced to the level of the IVC and the micropuncture sheath was exchanged for a peel-away  sheath. A tunneled hemodialysis catheter measuring 19 cm from tip to cuff was tunneled in a retrograde fashion from the anterior chest wall to the venotomy incision. The catheter was then placed through the peel-away sheath with tips ultimately positioned within the superior aspect of the right atrium. Final catheter positioning was confirmed and documented with a spot radiographic image. The catheter aspirates and flushes  normally. The catheter was flushed with appropriate volume heparin dwells. The catheter exit site was secured with a 0-Prolene retention suture. The venotomy incision was closed with an interrupted 4-0 Vicryl, Dermabond and Steri-strips. Dressings were applied. The patient tolerated the procedure well without immediate post procedural complication. IMPRESSION: Successful placement of 19 cm tip to cuff tunneled hemodialysis catheter via the right internal jugular vein with tips terminating within the superior aspect of the right atrium. The catheter is ready for immediate use. Electronically Signed   By: Jacqulynn Cadet M.D.   On: 12/01/2020 08:39   DG Chest Portable 1 View  Result Date: 11/29/2020 CLINICAL DATA:  Volume overload. Anemia. 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 EXAM: PORTABLE CHEST 1 VIEW COMPARISON:  11/07/2020 FINDINGS: Cardiac enlargement. Bilateral pleural effusions, greater on the left. Infiltration or atelectasis in the left base. Mild perihilar infiltration, possibly edema. No pneumothorax. IMPRESSION: Cardiac enlargement with bilateral pleural effusions, greater on the left. Infiltration or atelectasis in the left base. Progression since previous study. Electronically Signed   By: Lucienne Capers M.D.   On: 11/29/2020 18:38   US BIOPSY (KIDNEY)  Result Date: 12/03/2020 INDICATION: 40 year old female with history of SLE and CKD presenting with AK I. EXAM: ULTRASOUND GUIDED RENAL BIOPSY COMPARISON:  11/30/2020 MEDICATIONS: None. ANESTHESIA/SEDATION: Fentanyl 100 mcg IV; Versed 2 mg IV Total Moderate Sedation time: 15 minutes; The patient was continuously monitored during the procedure by the interventional radiology nurse under my direct supervision. COMPLICATIONS: None immediate. PROCEDURE: Informed written consent was obtained from the patient after a discussion of the risks, benefits and alternatives to treatment. The patient understands and consents the procedure. A timeout was  performed prior to the initiation of the procedure. Ultrasound scanning was performed of the bilateral flanks. The inferior pole of the left kidney was selected for biopsy due to location and sonographic window. The procedure was planned. The operative site was prepped and draped in the usual sterile fashion. The overlying soft tissues were anesthetized with 1% lidocaine with epinephrine. A 16 gauge core needle biopsy device was advanced into the inferior cortex of the left kidney and 2 core biopsies were obtained under direct ultrasound guidance. Images were saved for documentation purposes. The biopsy device was removed and hemostasis was obtained with manual compression. Post procedural scanning was negative for significant post procedural hemorrhage or additional complication. A dressing was placed. The patient tolerated the procedure well without immediate post procedural complication. IMPRESSION: Technically successful ultrasound guided left renal biopsy. Ruthann Cancer, MD [facility] Electronically Signed   By: Ruthann Cancer MD   On: 12/03/2020 16:23   ECHOCARDIOGRAM LIMITED  Result Date: 11/15/2020    ECHOCARDIOGRAM LIMITED REPORT   Patient Name:   LAZARIA SCHABEN Date of Exam: 11/15/2020 Medical Rec #:  [id number]        Height:       67.0 in Accession #:    [id number]       Weight:       167.5 lb Date of Birth:  [birth year]       BSA:  1.876 m Patient Age:    39 years         BP:           138/90 mmHg Patient Gender: F                HR:           93 bpm. Exam Location:  [address] Procedure: 3D Echo, Limited Echo, Cardiac Doppler and Limited Color Doppler Indications:    I31.3 Pericardial Effusion  History:        Patient has prior history of Echocardiogram examinations, most                 recent 11/12/2020. Risk Factors:Former Smoker. Substance Abuse,                 Anasarca, Lupus, Moderate Pericardial Effusion (11-12-20).   Sonographer:    Deliah Boston RDCS Referring Phys: [place]  1. Left ventricular ejection fraction, by estimation, is 55 to 60%. The left ventricle has normal function. The left ventricle has no regional wall motion abnormalities. There is mild concentric left ventricular hypertrophy. Left ventricular diastolic parameters are indeterminate.  2. Right ventricular systolic function is normal. The right ventricular size is mildly enlarged. Mildly increased right ventricular wall thickness. There is normal pulmonary artery systolic pressure.  3. Left atrial size was mildly dilated.  4. Right atrial size was mildly dilated.  5. Moderate pericardial effusion. The pericardial effusion is circumferential. There is hepatic vein flow reversal but with normal tissue Doppler: this is not suggestive of constriction.  6. The mitral valve is normal in structure. Mild to moderate mitral valve regurgitation.  7. The aortic valve is normal in structure. Aortic valve regurgitation is not visualized.  8. The inferior vena cava is normal in size with greater than 50% respiratory variability, suggesting right atrial pressure of 3 mmHg. Comparison(s): A prior study was performed on 11/12/20. Prior images reviewed side by side. Slight increase in mitral regurgitation; no evidence of tamponade. FINDINGS  Left Ventricle: Left ventricular ejection fraction, by estimation, is 55 to 60%. The left ventricle has normal function. The left ventricle has no regional wall motion abnormalities. The left ventricular internal cavity size was normal in size. There is  mild concentric left ventricular hypertrophy. Left ventricular diastolic parameters are indeterminate. Right Ventricle: The right ventricular size is mildly enlarged. Mildly increased right ventricular wall thickness. Right ventricular systolic function is normal. There is normal pulmonary artery systolic pressure. The tricuspid regurgitant velocity is 2.58 m/s,  and with an assumed right atrial pressure of 3 mmHg, the estimated right ventricular systolic pressure is 57.3 mmHg. Left Atrium: Left atrial size was mildly dilated. Right Atrium: Right atrial size was mildly dilated. Pericardium: A moderately sized pericardial effusion is present. The pericardial effusion is circumferential. Mitral Valve: The mitral valve is normal in structure. Mild to moderate mitral valve regurgitation. MV peak gradient, 9.7 mmHg. The mean mitral valve gradient is 3.0 mmHg. Aortic Valve: LM and RCA [place] noted. The aortic valve is normal in structure. Aortic valve regurgitation is not visualized. Aortic valve mean gradient measures 7.0 mmHg. Aortic valve peak gradient measures 15.0 mmHg. Aortic valve area, by VTI measures 2.17 cm. Pulmonic Valve: The pulmonic valve was normal in structure. Pulmonic valve regurgitation is not visualized. Aorta: The aortic root and ascending aorta are structurally normal, with no evidence of dilitation. Venous: The inferior vena cava is normal in size with greater than 50% respiratory  variability, suggesting right atrial pressure of 3 mmHg. LEFT VENTRICLE PLAX 2D LVIDd:         5.10 cm  Diastology LVIDs:         3.50 cm  LV e' medial:    11.00 cm/s LV PW:         1.20 cm  LV E/e' medial:  13.6 LV IVS:        0.80 cm  LV e' lateral:   12.80 cm/s LVOT diam:     2.10 cm  LV E/e' lateral: 11.7 LV SV:         91 LV SV Index:   49 LVOT Area:     3.46 cm                          3D Volume EF:                         3D EF:        69 %                         LV EDV:       220 ml                         LV ESV:       68 ml                         LV SV:        152 ml RIGHT VENTRICLE RV S prime:     14.90 cm/s TAPSE (M-mode): 3.2 cm LEFT ATRIUM              Index       RIGHT ATRIUM           Index LA diam:        3.80 cm  2.03 cm/m  RA Area:     26.30 cm LA Vol (A2C):   69.2 ml  36.89 ml/m RA Volume:   90.90 ml  48.46 ml/m LA Vol (A4C):   150.0 ml 79.97 ml/m LA  Biplane Vol: 107.0 ml 57.05 ml/m  AORTIC VALVE AV Area (Vmax):    2.27 cm AV Area (Vmean):   2.20 cm AV Area (VTI):     2.17 cm AV Vmax:           193.50 cm/s AV Vmean:          123.000 cm/s AV VTI:            0.420 m AV Peak Grad:      15.0 mmHg AV Mean Grad:      7.0 mmHg LVOT Vmax:         127.00 cm/s LVOT Vmean:        78.250 cm/s LVOT VTI:          0.264 m LVOT/AV VTI ratio: 0.63  AORTA Ao Root diam: 3.00 cm Ao Asc diam:  3.50 cm MITRAL VALVE                TRICUSPID VALVE MV Area (PHT): 2.00 cm     TR Peak grad:   26.6 mmHg MV Peak grad:  9.7 mmHg     TR Vmax:        258.00 cm/s MV Mean grad:  3.0 mmHg MV  Vmax:       1.56 m/s     SHUNTS MV Vmean:      78.6 cm/s    Systemic VTI:  0.26 m MV Decel Time: 247 msec     Systemic Diam: 2.10 cm MR Peak grad:   130.0 mmHg MR Mean grad:   84.0 mmHg MR Vmax:        570.00 cm/s MR Vmean:       440.0 cm/s MR PISA:        4.02 cm MR PISA Radius: 0.80 cm MV E velocity: 150.00 cm/s MV A velocity: 120.00 cm/s MV E/A ratio:  1.25 Rudean Haskell MD Electronically signed by Rudean Haskell MD Signature Date/Time: 11/15/2020/3:12:38 PM    Final     Subjective: She is breathing better, willing to go home   Discharge Exam: Vitals:   12/14/20 0841 12/14/20 0900  BP:  (!) 165/117  Pulse:  72  Resp:  18  Temp:  99.8 F (37.7 C)  SpO2: 99%      General: Pt is alert, awake, not in acute distress Cardiovascular: RRR, S1/S2 +, no rubs, no gallops Respiratory: CTA bilaterally, no wheezing, no rhonchi Abdominal: Soft, NT, ND, bowel sounds + Extremities: no edema, no cyanosis    The results of significant diagnostics from this hospitalization (including imaging, microbiology, ancillary and laboratory) are listed below for reference.     Microbiology: Recent Results (from the past 240 hour(s))  Culture, blood (routine x 2)     Status: None (Preliminary result)   Collection Time: 12/10/20  4:54 PM   Specimen: BLOOD LEFT FOREARM  Result Value Ref  Range Status   Specimen Description BLOOD LEFT FOREARM  Final   Special Requests   Final    BOTTLES DRAWN AEROBIC AND ANAEROBIC Blood Culture adequate volume   Culture   Final    NO GROWTH 4 DAYS Performed at [facility], [address], [place], [place] [address]    Report Status PENDING  Incomplete  Culture, blood (routine x 2)     Status: None (Preliminary result)   Collection Time: 12/10/20  4:58 PM   Specimen: BLOOD  Result Value Ref Range Status   Specimen Description BLOOD RIGHT ANTECUBITAL  Final   Special Requests   Final    BOTTLES DRAWN AEROBIC AND ANAEROBIC Blood Culture adequate volume   Culture   Final    NO GROWTH 4 DAYS Performed at [facility], [place] [address], [place], [place] [address]    Report Status PENDING  Incomplete     Labs: BNP (last 3 results) Recent Labs    11/29/20 2051  BNP >5,400.8*   Basic Metabolic Panel: Recent Labs  Lab 12/09/20 0845 12/10/20 0910 12/11/20 0342 12/12/20 1449 12/13/20 0328  NA 138 137 138 135 136  K 3.7 4.2 4.1 3.9 4.0  CL 102 102 101 98 99  CO2 _0 GLUCOSE 105* 92 105* 100* 105*  BUN 58* 70* 46* 40* 35*  CREATININE 5.86* 6.63* 5.62* 4.90* 4.83*  CALCIUM 6.7* 6.6* 6.7* 7.2* 7.1*  PHOS 5.5* 5.8* 5.0* 3.8 4.4   Liver Function Tests: Recent Labs  Lab 12/09/20 0845 12/10/20 0910 12/11/20 0342 12/12/20 1449 12/13/20 0328  ALBUMIN 1.8* 1.9* 1.7* 2.3* 1.9*   No results for input(s): LIPASE, AMYLASE in the last 168 hours. No results for input(s): AMMONIA in the last 168 hours. CBC: Recent Labs  Lab 12/10/20 0909 12/11/20 0342 12/12/20  1257 12/12/20 2244 12/13/20 0328  WBC 6.8 4.9 7.4 4.4 4.2  HGB 7.0* 6.3* 8.2* 8.3* 8.3*  HCT 21.7* 18.4* 24.4* 23.8* 25.8*  MCV 91.2 90.2 90.0 88.5 90.8  PLT 100* 92* 118* 121* 121*   Cardiac Enzymes: No results for input(s): CKTOTAL, CKMB, CKMBINDEX, TROPONINI in the last 168 hours. BNP: Invalid input(s): POCBNP CBG: Recent Labs  Lab  12/08/20 2144  GLUCAP 137*   D-Dimer No results for input(s): DDIMER in the last 72 hours. Hgb A1c No results for input(s): HGBA1C in the last 72 hours. Lipid Profile No results for input(s): CHOL, HDL, LDLCALC, TRIG, CHOLHDL, LDLDIRECT in the last 72 hours. Thyroid function studies No results for input(s): TSH, T4TOTAL, T3FREE, THYROIDAB in the last 72 hours.  Invalid input(s): FREET3 Anemia work up No results for input(s): VITAMINB12, FOLATE, FERRITIN, TIBC, IRON, RETICCTPCT in the last 72 hours. Urinalysis    Component Value Date/Time   COLORURINE YELLOW 11/29/2020 2054   APPEARANCEUR HAZY (A) 11/29/2020 2054   LABSPEC 1.015 11/29/2020 2054   PHURINE 6.0 11/29/2020 2054   GLUCOSEU NEGATIVE 11/29/2020 2054   HGBUR MODERATE (A) 11/29/2020 2054   BILIRUBINUR NEGATIVE 11/29/2020 2054   [place] 11/29/2020 2054   PROTEINUR >=300 (A) 11/29/2020 2054   UROBILINOGEN 0.2 10/17/2020 1026   NITRITE NEGATIVE 11/29/2020 2054   LEUKOCYTESUR NEGATIVE 11/29/2020 2054   Sepsis Labs Invalid input(s): PROCALCITONIN,  WBC,  LACTICIDVEN Microbiology Recent Results (from the past 240 hour(s))  Culture, blood (routine x 2)     Status: None (Preliminary result)   Collection Time: 12/10/20  4:54 PM   Specimen: BLOOD LEFT FOREARM  Result Value Ref Range Status   Specimen Description BLOOD LEFT FOREARM  Final   Special Requests   Final    BOTTLES DRAWN AEROBIC AND ANAEROBIC Blood Culture adequate volume   Culture   Final    NO GROWTH 4 DAYS Performed at [facility], [place] [address] [place], [place] [address]    Report Status PENDING  Incomplete  Culture, blood (routine x 2)     Status: None (Preliminary result)   Collection Time: 12/10/20  4:58 PM   Specimen: BLOOD  Result Value Ref Range Status   Specimen Description BLOOD RIGHT ANTECUBITAL  Final   Special Requests   Final    BOTTLES DRAWN AEROBIC AND ANAEROBIC Blood Culture adequate volume   Culture   Final    NO  GROWTH 4 DAYS Performed at [facility], [place] [address] [place], [place] [address]    Report Status PENDING  Incomplete     Time coordinating discharge: 40 minutes  SIGNED:   Elmarie Shiley, MD  [facility]

## 2020-12-14 NOTE — Progress Notes (Signed)
Pt received discharge instructions and does not have any additional questions or concerns at this time. TEDs hose are ordered. Pt is awaiting for her ride.

## 2020-12-14 NOTE — Progress Notes (Signed)
Renal Navigator received update from Nephrologist and Attending that patient is cleared for discharge today. Navigator spoke with patient to confirm plan for her to report to [facility] today after discharge (no later than 4:00pm) to complete intake paperwork. She will have her first outpatient HD treatment at her clinic tomorrow, Saturday, 12/15/20. She needs to arrive to her clinic at 11:25am on Saturday. She states understanding and appreciation. Navigator also documented all information including clinic address and phone number in patient's AVS. No barriers to discharge.   Alphonzo Cruise,  Renal Navigator [phone]

## 2020-12-14 NOTE — Progress Notes (Signed)
[facility] Progress Note    Assessment/ Plan:   Assessment/Recommendations: Cathy [name] is a/an 40 y.o. female with a past medical history significant for SLE, HTN, CKD, admitted for volume overload, hyperkalemia, renal failure, presumptive lupus nephritis.     Non-oliguric AKI, severe: Secondary to lupus nephritis. 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 Some chronicity present but pathologist felt to be salvageable.  -CLIP: accepted at [facility], chair time 11:45am -holding on HD today, resp status much better, can do HD at her outpatient unit tomorrow. Okay from a nephrology perspective for discharge  -s/p cytoxan 500mg  IV on 1/8; plan for q2wk x 6 doses total (second dose should be around 1/22); will set up outpt infusions, contacted our office. If still here then planning on cytoxan on/around 1/22 (most likely mon 1/24 on an off-hd day). Message sent to our office to help coordinate this -Continue oral prednisone 40mg  daily -Ppx includes: bactrim, protonix, and vit D  -CLIP process as AKI with hopeful recovery--[facility] TTS -TDC placed by IR on 12/31, appreciate help -changed Lasix 120 mg twice daily to torsemide 60mg  BID 1/12  Volume overload: likely 2/2 ARF and nephrotic syndrome. Continues to be significant but overall improved. -Dialysis as above, extra treatment tomorrow (sequential) -changing lasix to torsemide for potency and nephrotic syndrome  SLE: known lupus but no kidney biopsy due to compliance issues in the past. Labs on 11/07/20 ANA+, Anti-dsDNA 31, +anti-centromere, chromatin, smith, ro, la. SLE nephritis as above w/ plans for cytoxan. Will need to see Rheum outpatient  Hypertension: likely will improve w/ volume optimization. Steroids contributing -continue norvasc 10mg  daily, labetalol 400mg  BID -Lasix as above  Anemia due to CKD  -Transfuse for Hgb<7 g/dL; ctm, stable hgb - start aranesp on HD for 12/07/20, given  every Friday, switch to sat  Recommendations conveyed to primary service.   Subjective:   S/p DUF yesterday w/ UF 1.5L. UF limited by excessive cramping. Breath significantly better. Still has ways to go for fluid removal. Very eager to go home to take care of her children.   Objective:   BP (!) 163/115 (BP Location: Left Arm)   Pulse 70   Temp 98.7 F (37.1 C) (Oral)   Resp 18   Ht 5\' 7"  (1.702 m)   Wt 86 kg   SpO2 100%   BMI 29.69 kg/m   Intake/Output Summary (Last 24 hours) at 12/14/2020 0830 Last data filed at 12/14/2020 0750 Gross per 24 hour  Intake 1200 ml  Output 1512 ml  Net -312 ml   Weight change: -2.4 kg  Physical Exam: Gen: NAD, lying in bed CVS: RRR Resp: unlabored Abd: soft but mild distention present IFO:YDXAJOINOM total body edema, 3+ edema bilateral LE's (improved/softer)  ACCESS: R IJ TDC  Imaging: No results found.  Labs: BMET Recent Labs  Lab 12/08/20 0250 12/09/20 0845 12/10/20 0910 12/11/20 0342 12/12/20 1449 12/13/20 0328  NA 139 138 137 138 135 136  K 4.1 3.7 4.2 4.1 3.9 4.0  CL 103 102 102 101 98 99  CO2 24 25 24 27 26 27   GLUCOSE 114* 105* 92 105* 100* 105*  BUN 48* 58* 70* 46* 40* 35*  CREATININE 4.61* 5.86* 6.63* 5.62* 4.90* 4.83*  CALCIUM 6.9* 6.7* 6.6* 6.7* 7.2* 7.1*  PHOS  --  5.5* 5.8* 5.0* 3.8 4.4   CBC Recent Labs  Lab 12/11/20 0342 12/12/20 1257 12/12/20 2244 12/13/20 0328  WBC 4.9 7.4 4.4 4.2  HGB 6.3* 8.2* 8.3* 8.3*  HCT 18.4* 24.4* 23.8* 25.8*  MCV 90.2 90.0 88.5 90.8  PLT 92* 118* 121* 121*    Medications:    . albuterol  1-2 puff Inhalation BID  . amLODipine  10 mg Oral Daily  . Chlorhexidine Gluconate Cloth  6 each Topical Q0600  . cholecalciferol  1,000 Units Oral Daily  . cloNIDine  0.2 mg Oral TID  . darbepoetin (ARANESP) injection - DIALYSIS  100 mcg Intravenous Q Fri-HD  . folic acid  1 mg Oral Daily  . guaiFENesin  600 mg Oral BID  . hydrocortisone cream   Topical BID  . ipratropium  2  puff Inhalation Q6H  . labetalol  400 mg Oral BID  . pantoprazole  40 mg Oral Daily  . polyethylene glycol  17 g Oral BID  . predniSONE  40 mg Oral Q breakfast  . torsemide  60 mg Oral BID  . vitamin B-12  100 mcg Oral Daily      Keren Alverio  12/14/2020, 8:30 AM

## 2020-12-14 NOTE — Progress Notes (Signed)
PT Cancellation Note  Patient Details Name: Cathy [name] MRN: [id number] DOB: [birth year]   Cancelled Treatment:    Reason Eval/Treat Not Completed: Patient declined, no reason specified patient politely refuses PT today, states "I am just having an emotional hard moment right now". RN reports she has been independent in the room. Will attempt to check back if patient is agreeable and if time/schedule allow.    Windell [place], DPT, PN1   Supplemental Physical Therapist [facility]    Pager [phone] Acute Rehab Office [phone]

## 2020-12-14 NOTE — Discharge Instructions (Signed)
Go to [facility] today after discharge (no later than 4:00pm) to complete intake paperwork. Your first  outpatient HD treatment at her clinic tomorrow, Saturday, 12/15/20. You need  to arrive to the  clinic at 11:25am on Saturday

## 2020-12-14 NOTE — Progress Notes (Signed)
Pt left before tedhose could be applied.

## 2020-12-15 LAB — CULTURE, BLOOD (ROUTINE X 2)
Culture: NO GROWTH
Culture: NO GROWTH
Special Requests: ADEQUATE
Special Requests: ADEQUATE

## 2020-12-18 LAB — SURGICAL PATHOLOGY

## 2020-12-21 ENCOUNTER — Other Ambulatory Visit: Payer: Self-pay | Admitting: Hematology and Oncology

## 2020-12-21 ENCOUNTER — Telehealth: Payer: Self-pay | Admitting: Hematology and Oncology

## 2020-12-21 DIAGNOSIS — M3214 Glomerular disease in systemic lupus erythematosus: Secondary | ICD-10-CM

## 2020-12-21 NOTE — Progress Notes (Signed)
START OFF PATHWAY REGIMEN - Other   Custom Intervention:Cyclophosphamide 500 q14d:     Cyclophosphamide   **Always confirm dose/schedule in your pharmacy ordering system**  Patient Characteristics: Intent of Therapy: Curative Intent, Not Discussed with Patient

## 2020-12-21 NOTE — Telephone Encounter (Signed)
12/21/2020 Left VM w/ upcoming appt info. lab/MD/Cytoxan infusion on 12/26/20 @ 8:15. Informed pt to call if there were any questions or concerns SRW

## 2020-12-23 ENCOUNTER — Inpatient Hospital Stay (HOSPITAL_COMMUNITY)
Admission: EM | Admit: 2020-12-23 | Discharge: 2020-12-31 | DRG: 177 | Disposition: A | Discharge status: Home / Self Care | Payer: Medicaid Other | Attending: Internal Medicine | Admitting: Internal Medicine

## 2020-12-23 ENCOUNTER — Other Ambulatory Visit: Payer: Self-pay

## 2020-12-23 ENCOUNTER — Emergency Department (HOSPITAL_COMMUNITY): Payer: Medicaid Other

## 2020-12-23 ENCOUNTER — Encounter (HOSPITAL_COMMUNITY): Payer: Self-pay | Admitting: Emergency Medicine

## 2020-12-23 DIAGNOSIS — Z87891 Personal history of nicotine dependence: Secondary | ICD-10-CM | POA: Diagnosis not present

## 2020-12-23 DIAGNOSIS — Z79899 Other long term (current) drug therapy: Secondary | ICD-10-CM | POA: Diagnosis not present

## 2020-12-23 DIAGNOSIS — Z9114 Patient's other noncompliance with medication regimen: Secondary | ICD-10-CM | POA: Diagnosis not present

## 2020-12-23 DIAGNOSIS — R079 Chest pain, unspecified: Secondary | ICD-10-CM | POA: Diagnosis not present

## 2020-12-23 DIAGNOSIS — D631 Anemia in chronic kidney disease: Secondary | ICD-10-CM | POA: Diagnosis present

## 2020-12-23 DIAGNOSIS — U071 COVID-19: Principal | ICD-10-CM | POA: Diagnosis present

## 2020-12-23 DIAGNOSIS — F3130 Bipolar disorder, current episode depressed, mild or moderate severity, unspecified: Secondary | ICD-10-CM | POA: Diagnosis present

## 2020-12-23 DIAGNOSIS — M3214 Glomerular disease in systemic lupus erythematosus: Secondary | ICD-10-CM | POA: Diagnosis present

## 2020-12-23 DIAGNOSIS — J810 Acute pulmonary edema: Secondary | ICD-10-CM | POA: Diagnosis not present

## 2020-12-23 DIAGNOSIS — Z833 Family history of diabetes mellitus: Secondary | ICD-10-CM

## 2020-12-23 DIAGNOSIS — Z992 Dependence on renal dialysis: Secondary | ICD-10-CM

## 2020-12-23 DIAGNOSIS — J1282 Pneumonia due to coronavirus disease 2019: Secondary | ICD-10-CM | POA: Diagnosis present

## 2020-12-23 DIAGNOSIS — R0602 Shortness of breath: Secondary | ICD-10-CM

## 2020-12-23 DIAGNOSIS — Z5329 Procedure and treatment not carried out because of patient's decision for other reasons: Secondary | ICD-10-CM | POA: Diagnosis not present

## 2020-12-23 DIAGNOSIS — F419 Anxiety disorder, unspecified: Secondary | ICD-10-CM | POA: Diagnosis present

## 2020-12-23 DIAGNOSIS — J45909 Unspecified asthma, uncomplicated: Secondary | ICD-10-CM | POA: Diagnosis present

## 2020-12-23 DIAGNOSIS — R7989 Other specified abnormal findings of blood chemistry: Secondary | ICD-10-CM | POA: Diagnosis present

## 2020-12-23 DIAGNOSIS — R778 Other specified abnormalities of plasma proteins: Secondary | ICD-10-CM | POA: Diagnosis present

## 2020-12-23 DIAGNOSIS — Z8249 Family history of ischemic heart disease and other diseases of the circulatory system: Secondary | ICD-10-CM | POA: Diagnosis not present

## 2020-12-23 DIAGNOSIS — J9811 Atelectasis: Secondary | ICD-10-CM | POA: Diagnosis present

## 2020-12-23 DIAGNOSIS — R000 Tachycardia, unspecified: Secondary | ICD-10-CM | POA: Diagnosis not present

## 2020-12-23 DIAGNOSIS — I132 Hypertensive heart and chronic kidney disease with heart failure and with stage 5 chronic kidney disease, or end stage renal disease: Secondary | ICD-10-CM | POA: Diagnosis present

## 2020-12-23 DIAGNOSIS — J9601 Acute respiratory failure with hypoxia: Secondary | ICD-10-CM | POA: Diagnosis present

## 2020-12-23 DIAGNOSIS — N186 End stage renal disease: Secondary | ICD-10-CM | POA: Diagnosis present

## 2020-12-23 DIAGNOSIS — I5033 Acute on chronic diastolic (congestive) heart failure: Secondary | ICD-10-CM | POA: Diagnosis present

## 2020-12-23 DIAGNOSIS — N179 Acute kidney failure, unspecified: Secondary | ICD-10-CM | POA: Diagnosis present

## 2020-12-23 DIAGNOSIS — J90 Pleural effusion, not elsewhere classified: Secondary | ICD-10-CM | POA: Diagnosis not present

## 2020-12-23 DIAGNOSIS — Z9851 Tubal ligation status: Secondary | ICD-10-CM | POA: Diagnosis not present

## 2020-12-23 DIAGNOSIS — I161 Hypertensive emergency: Secondary | ICD-10-CM | POA: Diagnosis present

## 2020-12-23 DIAGNOSIS — I509 Heart failure, unspecified: Secondary | ICD-10-CM

## 2020-12-23 DIAGNOSIS — I517 Cardiomegaly: Secondary | ICD-10-CM | POA: Diagnosis not present

## 2020-12-23 DIAGNOSIS — J9690 Respiratory failure, unspecified, unspecified whether with hypoxia or hypercapnia: Secondary | ICD-10-CM | POA: Diagnosis present

## 2020-12-23 DIAGNOSIS — R609 Edema, unspecified: Secondary | ICD-10-CM | POA: Diagnosis not present

## 2020-12-23 LAB — I-STAT CHEM 8, ED
BUN: 49 mg/dL — ABNORMAL HIGH (ref 6–20)
Calcium, Ion: 0.94 mmol/L — ABNORMAL LOW (ref 1.15–1.40)
Chloride: 100 mmol/L (ref 98–111)
Creatinine, Ser: 7.2 mg/dL — ABNORMAL HIGH (ref 0.44–1.00)
Glucose, Bld: 114 mg/dL — ABNORMAL HIGH (ref 70–99)
HCT: 28 % — ABNORMAL LOW (ref 36.0–46.0)
Hemoglobin: 9.5 g/dL — ABNORMAL LOW (ref 12.0–15.0)
Potassium: 4 mmol/L (ref 3.5–5.1)
Sodium: 138 mmol/L (ref 135–145)
TCO2: 26 mmol/L (ref 22–32)

## 2020-12-23 LAB — CBC WITH DIFFERENTIAL/PLATELET
Abs Immature Granulocytes: 0.1 10*3/uL — ABNORMAL HIGH (ref 0.00–0.07)
Basophils Absolute: 0 10*3/uL (ref 0.0–0.1)
Basophils Relative: 0 %
Eosinophils Absolute: 0 10*3/uL (ref 0.0–0.5)
Eosinophils Relative: 0 %
HCT: 30.6 % — ABNORMAL LOW (ref 36.0–46.0)
Hemoglobin: 9.5 g/dL — ABNORMAL LOW (ref 12.0–15.0)
Immature Granulocytes: 1 %
Lymphocytes Relative: 30 %
Lymphs Abs: 3.8 10*3/uL (ref 0.7–4.0)
MCH: 28.5 pg (ref 26.0–34.0)
MCHC: 31 g/dL (ref 30.0–36.0)
MCV: 91.9 fL (ref 80.0–100.0)
Monocytes Absolute: 0.9 10*3/uL (ref 0.1–1.0)
Monocytes Relative: 7 %
Neutro Abs: 8 10*3/uL — ABNORMAL HIGH (ref 1.7–7.7)
Neutrophils Relative %: 62 %
Platelets: 141 10*3/uL — ABNORMAL LOW (ref 150–400)
RBC: 3.33 MIL/uL — ABNORMAL LOW (ref 3.87–5.11)
RDW: 14.6 % (ref 11.5–15.5)
WBC: 12.8 10*3/uL — ABNORMAL HIGH (ref 4.0–10.5)
nRBC: 0 % (ref 0.0–0.2)

## 2020-12-23 LAB — I-STAT VENOUS BLOOD GAS, ED
Acid-Base Excess: 2 mmol/L (ref 0.0–2.0)
Bicarbonate: 27.5 mmol/L (ref 20.0–28.0)
Calcium, Ion: 0.94 mmol/L — ABNORMAL LOW (ref 1.15–1.40)
HCT: 28 % — ABNORMAL LOW (ref 36.0–46.0)
Hemoglobin: 9.5 g/dL — ABNORMAL LOW (ref 12.0–15.0)
O2 Saturation: 35 %
Potassium: 4 mmol/L (ref 3.5–5.1)
Sodium: 138 mmol/L (ref 135–145)
TCO2: 29 mmol/L (ref 22–32)
pCO2, Ven: 47.1 mmHg (ref 44.0–60.0)
pH, Ven: 7.374 (ref 7.250–7.430)
pO2, Ven: 22 mmHg — CL (ref 32.0–45.0)

## 2020-12-23 LAB — COMPREHENSIVE METABOLIC PANEL
ALT: 29 U/L (ref 0–44)
AST: 28 U/L (ref 15–41)
Albumin: 2.4 g/dL — ABNORMAL LOW (ref 3.5–5.0)
Alkaline Phosphatase: 124 U/L (ref 38–126)
Anion gap: 15 (ref 5–15)
BUN: 51 mg/dL — ABNORMAL HIGH (ref 6–20)
CO2: 25 mmol/L (ref 22–32)
Calcium: 7.2 mg/dL — ABNORMAL LOW (ref 8.9–10.3)
Chloride: 99 mmol/L (ref 98–111)
Creatinine, Ser: 7.25 mg/dL — ABNORMAL HIGH (ref 0.44–1.00)
GFR, Estimated: 7 mL/min — ABNORMAL LOW (ref >60)
Glucose, Bld: 119 mg/dL — ABNORMAL HIGH (ref 70–99)
Potassium: 4 mmol/L (ref 3.5–5.1)
Sodium: 139 mmol/L (ref 135–145)
Total Bilirubin: 0.6 mg/dL (ref 0.3–1.2)
Total Protein: 5.8 g/dL — ABNORMAL LOW (ref 6.5–8.1)

## 2020-12-23 LAB — BRAIN NATRIURETIC PEPTIDE
B Natriuretic Peptide: >4500 pg/mL — ABNORMAL HIGH (ref 0.0–100.0)
B Natriuretic Peptide: >4500 pg/mL — ABNORMAL HIGH (ref 0.0–100.0)

## 2020-12-23 LAB — LACTATE DEHYDROGENASE: LDH: 512 U/L — ABNORMAL HIGH (ref 98–192)

## 2020-12-23 LAB — I-STAT BETA HCG BLOOD, ED (MC, WL, AP ONLY): I-stat hCG, quantitative: <5 m[IU]/mL (ref <5)

## 2020-12-23 LAB — TROPONIN I (HIGH SENSITIVITY)
Troponin I (High Sensitivity): 192 ng/L (ref <18)
Troponin I (High Sensitivity): 269 ng/L (ref <18)

## 2020-12-23 LAB — D-DIMER, QUANTITATIVE: D-Dimer, Quant: 8.38 ug/mL-FEU — ABNORMAL HIGH (ref 0.00–0.50)

## 2020-12-23 LAB — SARS CORONAVIRUS 2 BY RT PCR (HOSPITAL ORDER, PERFORMED IN ~~LOC~~ HOSPITAL LAB): SARS Coronavirus 2: POSITIVE — AB

## 2020-12-23 LAB — PROCALCITONIN: Procalcitonin: 4.64 ng/mL

## 2020-12-23 LAB — FIBRINOGEN: Fibrinogen: 462 mg/dL (ref 210–475)

## 2020-12-23 LAB — FERRITIN: Ferritin: 1035 ng/mL — ABNORMAL HIGH (ref 11–307)

## 2020-12-23 LAB — C-REACTIVE PROTEIN: CRP: 3.9 mg/dL — ABNORMAL HIGH (ref <1.0)

## 2020-12-23 MED ORDER — FUROSEMIDE 10 MG/ML IJ SOLN
80.0000 mg | Freq: Once | INTRAMUSCULAR | Status: AC
  Administered 2020-12-23: 80 mg via INTRAVENOUS
  Filled 2020-12-23: qty 8

## 2020-12-23 MED ORDER — GUAIFENESIN ER 600 MG PO TB12
600.0000 mg | ORAL_TABLET | Freq: Two times a day (BID) | ORAL | Status: DC
  Administered 2020-12-23 – 2020-12-31 (×16): 600 mg via ORAL
  Filled 2020-12-23 (×16): qty 1

## 2020-12-23 MED ORDER — PREDNISONE 5 MG PO TABS
50.0000 mg | ORAL_TABLET | Freq: Every day | ORAL | Status: DC
  Administered 2020-12-26 – 2020-12-31 (×6): 50 mg via ORAL
  Filled 2020-12-23 (×6): qty 2

## 2020-12-23 MED ORDER — LABETALOL HCL 200 MG PO TABS
400.0000 mg | ORAL_TABLET | Freq: Two times a day (BID) | ORAL | Status: DC
  Administered 2020-12-23 – 2020-12-31 (×15): 400 mg via ORAL
  Filled 2020-12-23 (×16): qty 2

## 2020-12-23 MED ORDER — ONDANSETRON HCL 4 MG PO TABS: 4.0000 mg | ORAL_TABLET | Freq: Four times a day (QID) | ORAL | Status: DC | PRN

## 2020-12-23 MED ORDER — NITROGLYCERIN IN D5W 200-5 MCG/ML-% IV SOLN
0.0000 ug/min | INTRAVENOUS | Status: DC
  Administered 2020-12-23: 60 ug/min via INTRAVENOUS
  Administered 2020-12-24 (×2): 150 ug/min via INTRAVENOUS
  Filled 2020-12-23 (×4): qty 250

## 2020-12-23 MED ORDER — IPRATROPIUM BROMIDE HFA 17 MCG/ACT IN AERS
2.0000 | INHALATION_SPRAY | Freq: Four times a day (QID) | RESPIRATORY_TRACT | Status: DC
  Administered 2020-12-23 – 2020-12-26 (×9): 2 via RESPIRATORY_TRACT
  Filled 2020-12-23: qty 12.9

## 2020-12-23 MED ORDER — HEPARIN SODIUM (PORCINE) 5000 UNIT/ML IJ SOLN
5000.0000 [IU] | Freq: Three times a day (TID) | INTRAMUSCULAR | Status: DC
  Filled 2020-12-23: qty 1

## 2020-12-23 MED ORDER — POLYETHYLENE GLYCOL 3350 17 G PO PACK: 17.0000 g | PACK | Freq: Every day | ORAL | Status: DC | PRN

## 2020-12-23 MED ORDER — METHYLPREDNISOLONE SODIUM SUCC 125 MG IJ SOLR
0.5000 mg/kg | Freq: Two times a day (BID) | INTRAMUSCULAR | Status: AC
  Administered 2020-12-24 – 2020-12-26 (×5): 43.125 mg via INTRAVENOUS
  Filled 2020-12-23 (×5): qty 2

## 2020-12-23 MED ORDER — VITAMIN B-12 100 MCG PO TABS
100.0000 ug | ORAL_TABLET | Freq: Every day | ORAL | Status: DC
  Administered 2020-12-23 – 2020-12-31 (×9): 100 ug via ORAL
  Filled 2020-12-23 (×9): qty 1

## 2020-12-23 MED ORDER — AMLODIPINE BESYLATE 10 MG PO TABS
10.0000 mg | ORAL_TABLET | Freq: Every day | ORAL | Status: DC
  Administered 2020-12-24 – 2020-12-31 (×7): 10 mg via ORAL
  Filled 2020-12-23 (×7): qty 1
  Filled 2020-12-23: qty 2

## 2020-12-23 MED ORDER — LORAZEPAM 2 MG/ML IJ SOLN: 0.5000 mg | Freq: Four times a day (QID) | INTRAMUSCULAR | Status: DC | PRN

## 2020-12-23 MED ORDER — HEPARIN BOLUS VIA INFUSION
5000.0000 [IU] | Freq: Once | INTRAVENOUS | Status: AC
  Administered 2020-12-23: 5000 [IU] via INTRAVENOUS
  Filled 2020-12-23: qty 5000

## 2020-12-23 MED ORDER — METHYLPREDNISOLONE SODIUM SUCC 125 MG IJ SOLR
125.0000 mg | Freq: Once | INTRAMUSCULAR | Status: AC
  Administered 2020-12-23: 125 mg via INTRAVENOUS
  Filled 2020-12-23: qty 2

## 2020-12-23 MED ORDER — OXYCODONE HCL 5 MG PO TABS: 5.0000 mg | ORAL_TABLET | ORAL | Status: DC | PRN

## 2020-12-23 MED ORDER — ACETAMINOPHEN 325 MG PO TABS
650.0000 mg | ORAL_TABLET | Freq: Four times a day (QID) | ORAL | Status: DC | PRN
  Administered 2020-12-25 – 2020-12-31 (×5): 650 mg via ORAL
  Filled 2020-12-23 (×5): qty 2

## 2020-12-23 MED ORDER — LABETALOL HCL 5 MG/ML IV SOLN: 10.0000 mg | Freq: Once | INTRAVENOUS | Status: DC

## 2020-12-23 MED ORDER — PREDNISONE 20 MG PO TABS: 50.0000 mg | ORAL_TABLET | Freq: Every day | ORAL | Status: DC

## 2020-12-23 MED ORDER — ONDANSETRON HCL 4 MG/2ML IJ SOLN: 4.0000 mg | Freq: Four times a day (QID) | INTRAMUSCULAR | Status: DC | PRN

## 2020-12-23 MED ORDER — METHYLPREDNISOLONE SODIUM SUCC 125 MG IJ SOLR: 0.5000 mg/kg | Freq: Two times a day (BID) | INTRAMUSCULAR | Status: DC

## 2020-12-23 MED ORDER — FUROSEMIDE 10 MG/ML IJ SOLN
160.0000 mg | Freq: Four times a day (QID) | INTRAVENOUS | Status: DC
  Administered 2020-12-23 – 2020-12-31 (×28): 160 mg via INTRAVENOUS
  Filled 2020-12-23 (×3): qty 16
  Filled 2020-12-23: qty 2
  Filled 2020-12-23: qty 10
  Filled 2020-12-23 (×3): qty 16
  Filled 2020-12-23 (×2): qty 10
  Filled 2020-12-23 (×4): qty 16
  Filled 2020-12-23: qty 10
  Filled 2020-12-23: qty 6
  Filled 2020-12-23: qty 4
  Filled 2020-12-23: qty 10
  Filled 2020-12-23 (×5): qty 16
  Filled 2020-12-23: qty 10
  Filled 2020-12-23: qty 16
  Filled 2020-12-23 (×2): qty 10
  Filled 2020-12-23: qty 8
  Filled 2020-12-23 (×4): qty 16
  Filled 2020-12-23 (×2): qty 10
  Filled 2020-12-23: qty 16

## 2020-12-23 MED ORDER — LORAZEPAM 2 MG/ML IJ SOLN
0.5000 mg | Freq: Once | INTRAMUSCULAR | Status: AC
  Administered 2020-12-23: 0.5 mg via INTRAVENOUS
  Filled 2020-12-23: qty 1

## 2020-12-23 MED ORDER — FUROSEMIDE 10 MG/ML IJ SOLN: 80.0000 mg | Freq: Two times a day (BID) | INTRAMUSCULAR | Status: DC

## 2020-12-23 MED ORDER — ALBUTEROL SULFATE HFA 108 (90 BASE) MCG/ACT IN AERS
1.0000 | INHALATION_SPRAY | Freq: Two times a day (BID) | RESPIRATORY_TRACT | Status: DC
  Administered 2020-12-23 – 2020-12-26 (×5): 2 via RESPIRATORY_TRACT
  Administered 2020-12-26: 1 via RESPIRATORY_TRACT
  Filled 2020-12-23 (×2): qty 6.7

## 2020-12-23 MED ORDER — SODIUM CHLORIDE 0.9 % IV SOLN
100.0000 mg | Freq: Every day | INTRAVENOUS | Status: AC
  Administered 2020-12-24 – 2020-12-27 (×4): 100 mg via INTRAVENOUS
  Filled 2020-12-23 (×4): qty 20

## 2020-12-23 MED ORDER — FENOFIBRATE 160 MG PO TABS
160.0000 mg | ORAL_TABLET | Freq: Every day | ORAL | Status: DC
Start: 2020-12-23 — End: 2020-12-31
  Administered 2020-12-24 – 2020-12-31 (×8): 160 mg via ORAL
  Filled 2020-12-23 (×9): qty 1

## 2020-12-23 MED ORDER — CHLORHEXIDINE GLUCONATE CLOTH 2 % EX PADS
6.0000 | MEDICATED_PAD | Freq: Every day | CUTANEOUS | Status: DC
  Administered 2020-12-24 – 2020-12-31 (×6): 6 via TOPICAL

## 2020-12-23 MED ORDER — FOLIC ACID 1 MG PO TABS
1.0000 mg | ORAL_TABLET | Freq: Every day | ORAL | Status: DC
  Administered 2020-12-24 – 2020-12-31 (×8): 1 mg via ORAL
  Filled 2020-12-23 (×8): qty 1

## 2020-12-23 MED ORDER — CLONIDINE HCL 0.2 MG PO TABS
0.2000 mg | ORAL_TABLET | Freq: Three times a day (TID) | ORAL | Status: DC
  Administered 2020-12-23 – 2020-12-28 (×14): 0.2 mg via ORAL
  Filled 2020-12-23 (×14): qty 1

## 2020-12-23 MED ORDER — ALBUTEROL (5 MG/ML) CONTINUOUS INHALATION SOLN: 10.0000 mg/h | INHALATION_SOLUTION | Freq: Once | RESPIRATORY_TRACT | Status: DC

## 2020-12-23 MED ORDER — SODIUM CHLORIDE 0.9 % IV SOLN
200.0000 mg | Freq: Once | INTRAVENOUS | Status: AC
  Administered 2020-12-23: 200 mg via INTRAVENOUS
  Filled 2020-12-23: qty 200

## 2020-12-23 MED ORDER — VITAMIN D 25 MCG (1000 UNIT) PO TABS
1000.0000 [IU] | ORAL_TABLET | Freq: Every day | ORAL | Status: DC
  Administered 2020-12-24 – 2020-12-31 (×8): 1000 [IU] via ORAL
  Filled 2020-12-23 (×8): qty 1

## 2020-12-23 MED ORDER — HEPARIN (PORCINE) 25000 UT/250ML-% IV SOLN
1300.0000 [IU]/h | INTRAVENOUS | Status: DC
  Administered 2020-12-23 – 2020-12-25 (×3): 1300 [IU]/h via INTRAVENOUS
  Filled 2020-12-23 (×2): qty 250

## 2020-12-23 NOTE — Consult Note (Signed)
Reason for Consult: To manage dialysis and dialysis related needs Referring Physician: [name] is an 40 y.o. 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  In review of the records, pt presented to HD on 1/15 and then on 1/21-  Had 3.3 liters removed and left out 0.5 under EDW with post BP high.  She presents tonight to ER visibly SOB -  CXR showing pulm edema but also COVID positive - we are asked to provide emergent HD due to her O2 req and malignant HTN.  Unfortunately I do not feel that emergent HD will be possible tonight as only one HD RN and he is booked with other emergent cases    Dialyzes at [place]-  AKI designation TTS - 4 hours  EDW 85. HD [place] 3/2.5, Dialyzer 180, Heparin no. Access TDC. No meds and no labs at OP center yet  Past Medical History:  Diagnosis Date  . Anxiety   . Asthma   . Bipolar depression ([place])   . Depression   . Gonorrhea   . Lupus ([place])   . Pelvic inflammatory disease (PID)   . Renal hypertension   . Trichomonas infection     Past Surgical History:  Procedure Laterality Date  . IR FLUORO GUIDE CV LINE RIGHT  11/30/2020  . IR US GUIDE VASC ACCESS RIGHT  11/30/2020  . RENAL BIOPSY    . TUBAL LIGATION N/A 02/03/2019   Procedure: POST PARTUM TUBAL LIGATION;  Surgeon: Aletha Halim, MD;  Location: [facility] LD ORS;  Service: Gynecology;  Laterality: N/A;    Family History  Problem Relation Age of Onset  . Diabetes Maternal Grandmother   . Hypertension Maternal Grandmother   . Diabetes Maternal Grandfather   . Hypertension Maternal Grandfather   . Diabetes Paternal Grandmother   . Hypertension Paternal Grandmother    . Diabetes Paternal Grandfather   . Hypertension Paternal Grandfather     Social History:  reports that she quit smoking about 2 months ago. Her smoking use included cigarettes. She smoked 0.25 packs per day. She has never used smokeless tobacco. She reports previous alcohol use. She reports previous drug use. Drug: Marijuana.  Allergies: No Known Allergies  Medications: I have reviewed the patient's current medications.   Results for orders placed or performed during the hospital encounter of 12/23/20 (from the past 48 hour(s))  SARS Coronavirus 2 by RT PCR (hospital order, performed in [facility] hospital lab) Nasopharyngeal Nasopharyngeal Swab     Status: Abnormal   Collection Time: 12/23/20  5:19 PM   Specimen: Nasopharyngeal Swab  Result Value Ref Range   SARS Coronavirus 2 POSITIVE (A) NEGATIVE    Comment: RESULT CALLED TO, READ BACK BY AND VERIFIED WITH: RN R HARDY [id number] AT 1856 BY CM (NOTE) SARS-CoV-2 target nucleic acids are DETECTED  SARS-CoV-2 RNA is generally detectable in upper respiratory specimens  during the acute phase of infection.  Positive results are indicative  of the presence of the identified virus, but do not rule out bacterial infection or co-infection with other pathogens not detected by the test.  Clinical correlation with patient history and  other diagnostic information is necessary to determine patient infection status.  The expected result is negative.  Fact Sheet for Patients:   StrictlyIdeas.no   Fact Sheet for Healthcare Providers:   BankingDealers.co.za    This test is not yet approved or cleared by the [place] FDA and  has been authorized for detection and/or diagnosis of SARS-CoV-2 by FDA under an Emergency Use Authorization (EUA).  This EUA will remain in effect (meaning this test  can be used) for the duration of  the COVID-19 declaration under Section 564(b)(1) of the Act,  21 U.S.C. section 360-bbb-3(b)(1), unless the authorization is terminated or revoked sooner.  Performed at [facility], [place] [address] [place], [place] [address]   Comprehensive metabolic panel     Status: Abnormal   Collection Time: 12/23/20  5:44 PM  Result Value Ref Range   Sodium 139 135 - 145 mmol/L   Potassium 4.0 3.5 - 5.1 mmol/L   Chloride 99 98 - 111 mmol/L   CO2 25 22 - 32 mmol/L   Glucose, Bld 119 (H) 70 - 99 mg/dL    Comment: Glucose reference range applies only to samples taken after fasting for at least 8 hours.   BUN 51 (H) 6 - 20 mg/dL   Creatinine, Ser 7.25 (H) 0.44 - 1.00 mg/dL   Calcium 7.2 (L) 8.9 - 10.3 mg/dL   Total Protein 5.8 (L) 6.5 - 8.1 g/dL   Albumin 2.4 (L) 3.5 - 5.0 g/dL   AST 28 15 - 41 U/L   ALT 29 0 - 44 U/L   Alkaline Phosphatase 124 38 - 126 U/L   Total Bilirubin 0.6 0.3 - 1.2 mg/dL   GFR, Estimated 7 (L) >60 mL/min    Comment: (NOTE) Calculated using the CKD-EPI Creatinine Equation (2021)    Anion gap 15 5 - 15    Comment: Performed at [facility], [place] [address], [place], [place] [address]  Troponin I (High Sensitivity)     Status: Abnormal   Collection Time: 12/23/20  5:44 PM  Result Value Ref Range   Troponin I (High Sensitivity) 192 (HH) <18 ng/L    Comment: CRITICAL RESULT CALLED TO, READ BACK BY AND VERIFIED WITHBenard Halsted RN [phone] K FORSYTH (NOTE) Elevated high sensitivity troponin I (hsTnI) values and significant  changes across serial measurements may suggest ACS but many other  chronic and acute conditions are known to elevate hsTnI results.  Refer to the Links section for chest pain algorithms and additional  guidance. Performed at [facility], [place] [address], [place], [place] [address]   Brain natriuretic peptide     Status: Abnormal   Collection Time: 12/23/20  5:44 PM  Result Value Ref Range   B Natriuretic Peptide >4,500.0 (H) 0.0 - 100.0 pg/mL    Comment: Performed at [place] [address] [place], [place] [address]  CBC with Differential     Status: Abnormal   Collection Time: 12/23/20  5:44 PM  Result Value Ref Range   WBC 12.8 (H) 4.0 - 10.5 K/uL   RBC 3.33 (L) 3.87 - 5.11 MIL/uL   Hemoglobin 9.5 (L) 12.0 - 15.0 g/dL   HCT 30.6 (L) 36.0 - 46.0 %   MCV 91.9 80.0 - 100.0 fL   MCH 28.5 26.0 - 34.0 pg   MCHC 31.0 30.0 - 36.0 g/dL   RDW 14.6 11.5 - 15.5 %   Platelets 141 (L) 150 -  400 K/uL   nRBC 0.0 0.0 - 0.2 %   Neutrophils Relative % 62 %   Neutro Abs 8.0 (H) 1.7 - 7.7 K/uL   Lymphocytes Relative 30 %   Lymphs Abs 3.8 0.7 - 4.0 K/uL   Monocytes Relative 7 %   Monocytes Absolute 0.9 0.1 - 1.0 K/uL   Eosinophils Relative 0 %   Eosinophils Absolute 0.0 0.0 - 0.5 K/uL   Basophils Relative 0 %   Basophils Absolute 0.0 0.0 - 0.1 K/uL   Immature Granulocytes 1 %   Abs Immature Granulocytes 0.10 (H) 0.00 - 0.07 K/uL    Comment: Performed at [place] [address] [place], [place] [address]  I-Stat beta hCG blood, ED     Status: None   Collection Time: 12/23/20  5:50 PM  Result Value Ref Range   I-stat hCG, quantitative <5.0 <5 mIU/mL   Comment 3            Comment:   GEST. AGE      CONC.  (mIU/mL)   <=1 WEEK        5 - 50     2 WEEKS       50 - 500     3 WEEKS       100 - 10,000     4 WEEKS     1,000 - 30,000        FEMALE AND NON-PREGNANT FEMALE:     LESS THAN 5 mIU/mL   I-stat chem 8, ED (not at [facility] or [facility])     Status: Abnormal   Collection Time: 12/23/20  5:53 PM  Result Value Ref Range   Sodium 138 135 - 145 mmol/L   Potassium 4.0 3.5 - 5.1 mmol/L   Chloride 100 98 - 111 mmol/L   BUN 49 (H) 6 - 20 mg/dL   Creatinine, Ser 7.20 (H) 0.44 - 1.00 mg/dL   Glucose, Bld 114 (H) 70 - 99 mg/dL    Comment: Glucose reference range applies only to samples taken after fasting for at least 8 hours.   Calcium, Ion 0.94 (L) 1.15 - 1.40 mmol/L   TCO2 26 22 - 32 mmol/L   Hemoglobin 9.5 (L) 12.0 - 15.0 g/dL   HCT 28.0 (L) 36.0 - 46.0 %   I-Stat venous blood gas, ED     Status: Abnormal   Collection Time: 12/23/20  5:53 PM  Result Value Ref Range   pH, Ven 7.374 7.250 - 7.430   pCO2, Ven 47.1 44.0 - 60.0 mmHg   pO2, Ven 22.0 (LL) 32.0 - 45.0 mmHg   Bicarbonate 27.5 20.0 - 28.0 mmol/L   TCO2 29 22 - 32 mmol/L   O2 Saturation 35.0 %   Acid-Base Excess 2.0 0.0 - 2.0 mmol/L   Sodium 138 135 - 145 mmol/L   Potassium 4.0 3.5 - 5.1 mmol/L   Calcium, Ion 0.94 (L) 1.15 - 1.40 mmol/L   HCT 28.0 (L) 36.0 - 46.0 %   Hemoglobin 9.5 (L) 12.0 - 15.0 g/dL   Sample type VENOUS    Comment NOTIFIED PHYSICIAN     DG Chest Port 1 View  Result Date: 12/23/2020 CLINICAL DATA:  40 year old female with history of shortness of breath. EXAM: PORTABLE CHEST 1 VIEW COMPARISON:  Chest x-ray 11/29/2020. FINDINGS: Right internal jugular PermCath with tip terminating at the superior cavoatrial junction. There is cephalization of the pulmonary vasculature, indistinctness of the interstitial markings, and patchy airspace disease throughout the  lungs bilaterally suggestive of moderate pulmonary edema. Moderate bilateral pleural effusions. Bibasilar areas of atelectasis. Mild cardiomegaly. IMPRESSION: 1. The appearance of the chest is most suggestive of fluid overload and/or heart failure, as detailed above. 2. Bibasilar areas of atelectasis. Electronically Signed   By: Vinnie Langton M.D.   On: 12/23/2020 18:11    ROS: not obtained due to COVID positive status-  Other provider notes reviewed  Blood pressure (!) 185/139, pulse 98, temperature 97.8 F (36.6 C), temperature source Axillary, resp. rate (!) 25, SpO2 100 %, unknown if currently breastfeeding. not examined face to face due to covid positive status- other provider notes reviewed- has [facility]   Assessment/Plan:  40 year old BF with SLE and recent diagnosis of diffuse proliferative lupus nephritis req initiation of dialysis support as well as iv cytoxan and prednisone in the last 3 weeks.  She now  presents with SOB appears to be due to volume overload and malignant HTN but also is COVID positive   1 Hypoxia-  CXR states to pulmonary edema and has malignant HTN but also is COVID positive.  Trying to get her on dialysis as soon as can but due to staff shortages and limits of being able to dialyze covid pts not sure when will be done.  She still makes urine so will maximize IV furosemide dosing and COVID treatment as below 2 AKI: dialysis dependent for the last 3 weeks.  her kidney biopsy showed diffuse proliferative lupus nephritis which possibly could respond to treatment so has an AKI designation while treating with cytoxan and steroids-  She was supposed to be getting HD 3 times a week-  Has had twice in 9 days and now with volume overload-  High dose diuretics and dialysis when we can.  Put next dose of IV cytoxan on hold for now-  Was due 1/22 3 Hypertension: malignant-  Was supposed to be on clonidine/labetalol/amlodipine and torsemide total of 120 mg per day, unclear how compliant she was.  Home meds have been started as well as a nitro drip and high dose diuretics 4. COVID positive: steroids and remdesivir per hospitalists-  Respiratory support 5. Anemia- : not major issue right now, supportive care   Louis Meckel 12/23/2020, 8:28 PM

## 2020-12-23 NOTE — ED Triage Notes (Signed)
Patient BIB [facility] from home. Complaint of shortness of breath. Patient dialysis patient. Patient has missed lasix x3 days d/t running out of medication. Patient goes to dialysis tues, thur, Saturday. Last treatment was on Friday d/t weather. Patient visibly short of breath. Diminished lower breath sounds. VSS. With EMS. A&Ox4.

## 2020-12-23 NOTE — Progress Notes (Signed)
Pt c/o SOB. SATs 98% on 4L [place], pt vocally voices she feels like she cannot breathe. Hx of asthma. Bipap placed on pt and she is unable to tolerate, pt says it makes her breathing worse. MD aware.

## 2020-12-23 NOTE — ED Provider Notes (Signed)
[facility] Provider Note   CSN: [id number] Arrival date & time: 12/23/20  1710     History Chief Complaint  Patient presents with  . Shortness of Breath    Cathy [name] is a 40 y.o. female.  The history is provided by the patient and medical records.   Cathy [name] is a 40 y.o. female who presents to the Emergency Department complaining of sob.  Level V caveat due to respiratory distress.  She has a hx/o lupus, ESRD on HD that was recently started.  She is typically on T, TH, SA but her last session was on Friday due to the weather.  She is on lasix and has not been taking it for the last three days due to running out.  She reports feeling unwell since getting sick, cannot elaborate on when she became ill.  She complains of leg edema, productive cough.  Sxs are severe, worsening.      Past Medical History:  Diagnosis Date  . Anxiety   . Asthma   . Bipolar depression ([place])   . Depression   . Gonorrhea   . Lupus ([place])   . Pelvic inflammatory disease (PID)   . Renal hypertension   . Trichomonas infection     Patient Active Problem List   Diagnosis Date Noted  . 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 Volume overload 11/29/2020  . Hyperkalemia 11/29/2020  . Pericardial effusion   . Aortic atherosclerosis ([place]) 11/07/2020  . Anasarca 11/07/2020  . Abdominal pain 11/07/2020  . Diarrhea 11/07/2020  . 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 Tubal ligation evaluation 02/03/2019  . Renal hypertension 02/02/2019  . Pulmonary edema 02/01/2019  . AMA (advanced maternal age) multigravida 35+ 01/12/2019  . Non compliance w medication regimen 01/12/2019  . Current chronic use of systemic steroids 01/12/2019  . Unwanted fertility 12/10/2018  . Anemia 10/12/2018  . Supervision of high risk pregnancy, antepartum 10/04/2018  . Lupus ([place]) 09/20/2018  . Supervision of pregnancy with grand multiparity  in second trimester 09/20/2018  . Substance abuse ([place]) 09/20/2018  . Acute renal failure (ARF) ([place]) 09/20/2018  . MDD (major depressive disorder) 10/11/2015  . Asthma 09/22/2011    Past Surgical History:  Procedure Laterality Date  . IR FLUORO GUIDE CV LINE RIGHT  11/30/2020  . IR US GUIDE VASC ACCESS RIGHT  11/30/2020  . RENAL BIOPSY    . TUBAL LIGATION N/A 02/03/2019   Procedure: POST PARTUM TUBAL LIGATION;  Surgeon: Aletha Halim, MD;  Location: [facility] LD ORS;  Service: Gynecology;  Laterality: N/A;     OB History    Gravida  6   Para  5   Term  4   Preterm  1   AB  1   Living  5     SAB  1   IAB  0   Ectopic  0   Multiple  0   Live Births  5           Family History  Problem Relation Age of Onset  . Diabetes Maternal Grandmother   . Hypertension Maternal Grandmother   . Diabetes Maternal Grandfather   . Hypertension Maternal Grandfather   . Diabetes Paternal Grandmother   . Hypertension Paternal Grandmother   . Diabetes Paternal Grandfather   . Hypertension Paternal Grandfather     Social History   Tobacco Use  . Smoking status: Former Smoker    Packs/day: 0.25  Types: Cigarettes    Quit date: 10/08/2020    Years since quitting: 0.2  . Smokeless tobacco: Never Used  Vaping Use  . Vaping Use: Never used  Substance Use Topics  . Alcohol use: Not Currently  . Drug use: Not Currently    Types: Marijuana    Comment: 3 times a week     Home Medications Prior to Admission medications   Medication Sig Start Date End Date Taking? Authorizing Provider  albuterol (PROAIR HFA) 108 (90 Base) MCG/ACT inhaler Inhale 1-2 puffs into the lungs 2 (two) times daily. 12/14/20   Regalado, Belkys A, MD  amLODipine (NORVASC) 10 MG tablet Take 1 tablet (10 mg total) by mouth daily. 12/14/20   Regalado, Belkys A, MD  cholecalciferol (VITAMIN D) 25 MCG tablet Take 1 tablet (1,000 Units total) by mouth daily. 12/14/20   Regalado, Belkys A, MD  cloNIDine  (CATAPRES) 0.2 MG tablet Take 1 tablet (0.2 mg total) by mouth 3 (three) times daily. 12/14/20 01/13/21  Regalado, Jerald Kief A, MD  fenofibrate 160 MG tablet Take 1 tablet (160 mg total) by mouth daily. 11/14/20   Shelly Coss, MD  folic acid (FOLVITE) 1 MG tablet Take 1 tablet (1 mg total) by mouth daily. 12/14/20   Regalado, Belkys A, MD  guaiFENesin (MUCINEX) 600 MG 12 hr tablet Take 1 tablet (600 mg total) by mouth 2 (two) times daily. 12/14/20   Regalado, Belkys A, MD  hydrocortisone cream 1 % Apply topically 2 (two) times daily. 11/13/20   Shelly Coss, MD  ipratropium (ATROVENT HFA) 17 MCG/ACT inhaler Inhale 2 puffs into the lungs every 6 (six) hours. 12/14/20   Regalado, Belkys A, MD  labetalol (NORMODYNE) 200 MG tablet Take 2 tablets (400 mg total) by mouth 2 (two) times daily. 12/14/20   Regalado, Belkys A, MD  pantoprazole (PROTONIX) 40 MG tablet Take 1 tablet (40 mg total) by mouth daily. 12/14/20   Regalado, Belkys A, MD  polyethylene glycol (MIRALAX / GLYCOLAX) 17 g packet Take 17 g by mouth daily as needed for mild constipation. Patient taking differently: Take 17 g by mouth daily as needed for mild constipation (MIX AND DRINK). 11/13/20   Shelly Coss, MD  predniSONE (DELTASONE) 20 MG tablet Take 2 tablets (40 mg total) by mouth daily with breakfast. 12/14/20   Regalado, Belkys A, MD  torsemide (DEMADEX) 20 MG tablet Take 3 tablets (60 mg total) by mouth 2 (two) times daily. 12/14/20   Regalado, Belkys A, MD  vitamin B-12 100 MCG tablet Take 1 tablet (100 mcg total) by mouth daily. 12/14/20   Regalado, Cassie Freer, MD    Allergies    Patient has no known allergies.  Review of Systems   Review of Systems  All other systems reviewed and are negative.   Physical Exam Updated Vital Signs BP (!) 206/148 (BP Location: Right Arm)   Pulse (!) 110   Temp 97.8 F (36.6 C) (Axillary)   Resp (!) 25   SpO2 99%   Physical Exam Vitals and nursing note reviewed.  Constitutional:       General: She is in acute distress.     Appearance: She is well-developed and well-nourished. She is ill-appearing and diaphoretic.  HENT:     Head: Normocephalic and atraumatic.  Cardiovascular:     Rate and Rhythm: Normal rate and regular rhythm.     Heart sounds: No murmur heard.   Pulmonary:     Effort: Respiratory distress present.  Comments: Wheezes bilaterally Abdominal:     Palpations: Abdomen is soft.     Tenderness: There is no abdominal tenderness. There is no guarding or rebound.  Musculoskeletal:        General: No tenderness or edema.     Comments: Significant pitting BLE edema  Skin:    General: Skin is warm.  Neurological:     Mental Status: She is alert and oriented to person, place, and time.  Psychiatric:        Mood and Affect: Mood and affect normal.        Behavior: Behavior normal.     ED Results / Procedures / Treatments   Labs (all labs ordered are listed, but only abnormal results are displayed) Labs Reviewed  SARS CORONAVIRUS 2 BY RT PCR (HOSPITAL ORDER, [facility]) - Abnormal; Notable for the following components:      Result Value   SARS Coronavirus 2 POSITIVE (*)    All other components within normal limits  COMPREHENSIVE METABOLIC PANEL - Abnormal; Notable for the following components:   Glucose, Bld 119 (*)    BUN 51 (*)    Creatinine, Ser 7.25 (*)    Calcium 7.2 (*)    Total Protein 5.8 (*)    Albumin 2.4 (*)    GFR, Estimated 7 (*)    All other components within normal limits  BRAIN NATRIURETIC PEPTIDE - Abnormal; Notable for the following components:   B Natriuretic Peptide >4,500.0 (*)    All other components within normal limits  CBC WITH DIFFERENTIAL/PLATELET - Abnormal; Notable for the following components:   WBC 12.8 (*)    RBC 3.33 (*)    Hemoglobin 9.5 (*)    HCT 30.6 (*)    Platelets 141 (*)    Neutro Abs 8.0 (*)    Abs Immature Granulocytes 0.10 (*)    All other components within normal  limits  I-STAT CHEM 8, ED - Abnormal; Notable for the following components:   BUN 49 (*)    Creatinine, Ser 7.20 (*)    Glucose, Bld 114 (*)    Calcium, Ion 0.94 (*)    Hemoglobin 9.5 (*)    HCT 28.0 (*)    All other components within normal limits  I-STAT VENOUS BLOOD GAS, ED - Abnormal; Notable for the following components:   pO2, Ven 22.0 (*)    Calcium, Ion 0.94 (*)    HCT 28.0 (*)    Hemoglobin 9.5 (*)    All other components within normal limits  TROPONIN I (HIGH SENSITIVITY) - Abnormal; Notable for the following components:   Troponin I (High Sensitivity) 192 (*)    All other components within normal limits  I-STAT BETA HCG BLOOD, ED ([facility], [facility], [facility] ONLY)  TROPONIN I (HIGH SENSITIVITY)    EKG EKG Interpretation  Date/Time:  Sunday December 23 2020 17:18:14 EST Ventricular Rate:  102 PR Interval:    QRS Duration: 66 QT Interval:  367 QTC Calculation: 479 R Axis:   30 Text Interpretation: Sinus tachycardia Probable left atrial enlargement Low voltage, extremity leads Consider anterior infarct Nonspecific T abnormalities, lateral leads Confirmed by Quintella Reichert [id number]) on 12/23/2020 5:36:36 PM   Radiology DG Chest Port 1 View  Result Date: 12/23/2020 CLINICAL DATA:  40 year old female with history of shortness of breath. EXAM: PORTABLE CHEST 1 VIEW COMPARISON:  Chest x-ray 11/29/2020. FINDINGS: Right internal jugular PermCath with tip terminating at the superior cavoatrial junction. There is cephalization of  the pulmonary vasculature, indistinctness of the interstitial markings, and patchy airspace disease throughout the lungs bilaterally suggestive of moderate pulmonary edema. Moderate bilateral pleural effusions. Bibasilar areas of atelectasis. Mild cardiomegaly. IMPRESSION: 1. The appearance of the chest is most suggestive of fluid overload and/or heart failure, as detailed above. 2. Bibasilar areas of atelectasis. Electronically Signed   By: Vinnie Langton M.D.   On:  12/23/2020 18:11    Procedures Procedures (including critical care time) CRITICAL CARE Performed by: Quintella Reichert   Total critical care time: 45 minutes  Critical care time was exclusive of separately billable procedures and treating other patients.  Critical care was necessary to treat or prevent imminent or life-threatening deterioration.  Critical care was time spent personally by me on the following activities: development of treatment plan with patient and/or surrogate as well as nursing, discussions with consultants, evaluation of patient's response to treatment, examination of patient, obtaining history from patient or surrogate, ordering and performing treatments and interventions, ordering and review of laboratory studies, ordering and review of radiographic studies, pulse oximetry and re-evaluation of patient's condition.   Medications Ordered in ED Medications  nitroGLYCERIN 50 mg in dextrose 5 % 250 mL (0.2 mg/mL) infusion (60 mcg/min Intravenous New Bag/Given 12/23/20 1900)  methylPREDNISolone sodium succinate (SOLU-MEDROL) 125 mg/2 mL injection 125 mg (125 mg Intravenous Given 12/23/20 1732)  furosemide (LASIX) injection 80 mg (80 mg Intravenous Given 12/23/20 1731)  LORazepam (ATIVAN) injection 0.5 mg (0.5 mg Intravenous Given 12/23/20 1804)  furosemide (LASIX) injection 80 mg (80 mg Intravenous Given 12/23/20 1843)    ED Course  I have reviewed the triage vital signs and the nursing notes.  Pertinent labs & imaging results that were available during my care of the patient were reviewed by me and considered in my medical decision making (see chart for details).    MDM Rules/Calculators/A&P                         patient with ESR D on recently started hemodialysis here for evaluation of shortness of breath and setting of missed medications and dialysis due to weather. She is ill appearing on evaluation with respiratory distress, tachycardia and hypertension. Attempted  to initiate BiPAP but patient unable to tolerate due to anxiety. She was treated with non-rebreather, Solu-Medrol, Lasix, nitroglycerin drip for hypertension, pulmonary edema and volume overload. Discussed with nephrologist, who recommends ongoing temporizing measures for pulmonary edema as dialysis is not immediately available. She is partially improved on reassessment after interventions. Hospitalist consulted for admission for ongoing treatment.  Cathy [name] was evaluated in Emergency Department on 12/23/2020 for the symptoms described in the history of present illness. She was evaluated in the context of the global COVID-19 pandemic, which necessitated consideration that the patient might be at risk for infection with the SARS-CoV-2 virus that causes COVID-19. Institutional protocols and algorithms that pertain to the evaluation of patients at risk for COVID-19 are in a state of rapid change based on information released by regulatory bodies including the CDC and federal and state organizations. These policies and algorithms were followed during the patient's care in the ED.  Final Clinical Impression(s) / ED Diagnoses Final diagnoses:  Acute pulmonary edema ([place])  Pneumonia due to COVID-19 virus    Rx / DC Orders ED Discharge Orders    None       Quintella Reichert, MD 12/23/20 2333

## 2020-12-23 NOTE — Progress Notes (Signed)
ANTICOAGULATION CONSULT NOTE - Initial Consult  Pharmacy Consult for heparin Indication: COVID infection, elevated d-dimer  No Known Allergies  Patient Measurements:   Heparin Dosing Weight: 80kg  Vital Signs: Temp: 97.8 F (36.6 C) (01/23 1719) Temp Source: Axillary (01/23 1719) BP: 185/139 (01/23 2015) Pulse Rate: 98 (01/23 2015)  Labs: Recent Labs    12/23/20 1744 12/23/20 1753 12/23/20 2000  HGB 9.5* 9.5*  9.5*  --   HCT 30.6* 28.0*  28.0*  --   PLT 141*  --   --   CREATININE 7.25* 7.20*  --   TROPONINIHS 192*  --  269*    Estimated Creatinine Clearance: 12.1 mL/min (A) (by C-G formula based on SCr of 7.2 mg/dL (H)).   Medical History: Past Medical History:  Diagnosis Date  . Anxiety   . Asthma   . Bipolar depression ([place])   . Depression   . Gonorrhea   . Lupus ([place])   . Pelvic inflammatory disease (PID)   . Renal hypertension   . Trichomonas infection    Assessment: 15 YOF presenting with SOB, COVID +, d-dimer 8.38.  She is not on anticoagulation PTA,  Chronic anemia stable.  Goal of Therapy:  Heparin level 0.3-0.7 units/ml Monitor platelets by anticoagulation protocol: Yes   Plan:  Heparin 5000 units IV x 1, and gtt at 1300 units/hr F/u 8 hour heparin level F/u DVT/PE workup  Bertis Ruddy, PharmD Clinical Pharmacist ED Pharmacist Phone # [phone] 12/23/2020 9:42 PM

## 2020-12-23 NOTE — ED Notes (Signed)
Dr.Rees shown results of VBG. ED-Lab

## 2020-12-23 NOTE — H&P (Addendum)
TRH H&P    Patient Demographics:    Cathy [name], is a 40 y.o. female  MRN: [id number]  DOB - [birth year]  Admit Date - 12/23/2020  Referring MD/NP/PA: Ayesha Rumpf  Outpatient Primary MD for the patient is Patient, No Pcp Per  Patient coming from: Home  Chief complaint- dyspnea   HPI:    Cathy [name]  is a 40 y.o. 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 Patient reports that she has felt short of breath for the last two days, but it became acutely worse today. Patient reports that for the last two days, exertion and laying flat would make the shortness of breath worse, resting in a seated position would make it better. She has tried her albuterol inhaler at home without relief. She has had peripheral edema since her last hospitalization, and she is not sure what her dry weight is supposed to be. Patient is newly on dialysis with right chest access. Patient reports that her normal schedule is T TH S, but because of weather, she was last dialyzed on 1/21. Patient reports that she has not been adherent with her home medications including lasix and BP medications because she could not afford them.  She was sitting on the side of the bed today when she became acutely worse. She reports that she had not eaten recently, or been exerting herself. She had been talking on the phone to her mother just prior to this. She felt like "someone was squeezing" her lungs. She was wheezing, and she had chest pressure. Patient reports she started coughing spells, and had palpitations as well. She called EMS who transported her to ER.  Patient had recently been admitted for similar. Prior to last admission her baseline Cr was 2-3. At the last admission it was 6.5. 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 She was on high dose solumedrol and  then transitioned to PO prednisone.  She was started on Cytoxan and received her first dose on January 8.  Her dosing is post to be every 2 weeks and her last dose was post to be January 2 22nd.  Patient reports that she has not got this dose.   In the ED T 97.8, HR 97-113, R 19, BP 186/141, satting at 90-100% on NRB mask - refused Bipap.  Hematology reveals a leukocytosis of 12.8, hemoglobin 9.5 Chemistry panel reveals a BUN of 49, creatinine 7.20 BNP is greater than 4500, Trope is 192, hCG is less than 5 Chest x-ray shows appearance of chest is most suggestive of fluid overload and or heart failure, with bibasilar areas of atelectasis Patient was given '160mg'$  IV lasix, 0.'5mg'$  ativan, solumedrol '125mg'$  and started on nitro drip Admit to progressive.   Review of systems:    In addition to the HPI above,  No Fever-chills, Endorses headache, No changes with Vision or hearing, No problems swallowing food or Liquids, Admits to chest pressure, dyspnea, and non productive cough No Abdominal  pain, No Nausea or Vomiting, bowel movements are regular, No Blood in stool or Urine, No dysuria, No new skin rashes or bruises, No new joints pains-aches,  No new weakness, tingling, numbness in any extremity, No recent weight gain or loss, No polyuria, polydypsia or polyphagia, No significant Mental Stressors.  All other systems reviewed and are negative.    Past History of the following :    Past Medical History:  Diagnosis Date  . Anxiety   . Asthma   . Bipolar depression ([place])   . Depression   . Gonorrhea   . Lupus ([place])   . Pelvic inflammatory disease (PID)   . Renal hypertension   . Trichomonas infection       Past Surgical History:  Procedure Laterality Date  . IR FLUORO GUIDE CV LINE RIGHT  11/30/2020  . IR US GUIDE VASC ACCESS RIGHT  11/30/2020  . RENAL BIOPSY    . TUBAL LIGATION N/A 02/03/2019   Procedure: POST PARTUM TUBAL LIGATION;  Surgeon: Aletha Halim, MD;  Location:  [facility] ORS;  Service: Gynecology;  Laterality: N/A;      Social History:      Social History   Tobacco Use  . Smoking status: Former Smoker    Packs/day: 0.25    Types: Cigarettes    Quit date: 10/08/2020    Years since quitting: 0.2  . Smokeless tobacco: Never Used  Substance Use Topics  . Alcohol use: Not Currently       Family History :     Family History  Problem Relation Age of Onset  . Diabetes Maternal Grandmother   . Hypertension Maternal Grandmother   . Diabetes Maternal Grandfather   . Hypertension Maternal Grandfather   . Diabetes Paternal Grandmother   . Hypertension Paternal Grandmother   . Diabetes Paternal Grandfather   . Hypertension Paternal Grandfather       Home Medications:   Prior to Admission medications   Medication Sig Start Date End Date Taking? Authorizing Provider  albuterol (PROAIR HFA) 108 (90 Base) MCG/ACT inhaler Inhale 1-2 puffs into the lungs 2 (two) times daily. 12/14/20   Regalado, Belkys A, MD  amLODipine (NORVASC) 10 MG tablet Take 1 tablet (10 mg total) by mouth daily. 12/14/20   Regalado, Belkys A, MD  cholecalciferol (VITAMIN D) 25 MCG tablet Take 1 tablet (1,000 Units total) by mouth daily. 12/14/20   Regalado, Belkys A, MD  cloNIDine (CATAPRES) 0.2 MG tablet Take 1 tablet (0.2 mg total) by mouth 3 (three) times daily. 12/14/20 01/13/21  Regalado, Jerald Kief A, MD  fenofibrate 160 MG tablet Take 1 tablet (160 mg total) by mouth daily. 11/14/20   Shelly Coss, MD  folic acid (FOLVITE) 1 MG tablet Take 1 tablet (1 mg total) by mouth daily. 12/14/20   Regalado, Belkys A, MD  guaiFENesin (MUCINEX) 600 MG 12 hr tablet Take 1 tablet (600 mg total) by mouth 2 (two) times daily. 12/14/20   Regalado, Belkys A, MD  hydrocortisone cream 1 % Apply topically 2 (two) times daily. 11/13/20   Shelly Coss, MD  ipratropium (ATROVENT HFA) 17 MCG/ACT inhaler Inhale 2 puffs into the lungs every 6 (six) hours. 12/14/20   Regalado, Belkys A, MD  labetalol  (NORMODYNE) 200 MG tablet Take 2 tablets (400 mg total) by mouth 2 (two) times daily. 12/14/20   Regalado, Belkys A, MD  pantoprazole (PROTONIX) 40 MG tablet Take 1 tablet (40 mg total) by mouth daily. 12/14/20   Regalado, Cassie Freer, MD  polyethylene glycol (MIRALAX / GLYCOLAX) 17 g packet Take 17 g by mouth daily as needed for mild constipation. Patient taking differently: Take 17 g by mouth daily as needed for mild constipation (MIX AND DRINK). 11/13/20   Shelly Coss, MD  predniSONE (DELTASONE) 20 MG tablet Take 2 tablets (40 mg total) by mouth daily with breakfast. 12/14/20   Regalado, Belkys A, MD  torsemide (DEMADEX) 20 MG tablet Take 3 tablets (60 mg total) by mouth 2 (two) times daily. 12/14/20   Regalado, Belkys A, MD  vitamin B-12 100 MCG tablet Take 1 tablet (100 mcg total) by mouth daily. 12/14/20   Regalado, Belkys A, MD     Allergies:    No Known Allergies   Physical Exam:   Vitals  Blood pressure (!) 185/139, pulse 98, temperature 97.8 F (36.6 C), temperature source Axillary, resp. rate (!) 25, SpO2 100 %, unknown if currently breastfeeding.  1.  General: Laying supine in bed, with NRB on, resting comfortably with head of bed elevated  2. Psychiatric: Mood is anxious, behavior is normal, AO x 3, cooperative with exam  3. Neurologic: CN II-XII intact, moves all 4 extremities voluntarily, speech and language normal  4. HEENMT:  Head is atraumatic, normocephalic, Neck is supple, trachea is midline  5. Respiratory : Crackles on exam greater on r than l. No wheezing or rhonchi  6. Cardiovascular : HR tachycardic, no murmur rub or gallop. Peripheral pitting edema to mid thigh  7. Gastrointestinal:  Abdomen is soft, non distended, and non tender to palptation  8. Skin:  Skin is warm dry and intact without acute lesion on limited exam  9.Musculoskeletal:  Significant pitting edema to mid thigh BL, no acute deformity, no calf tenderness    Data Review:     CBC Recent Labs  Lab 12/23/20 1744 12/23/20 1753  WBC 12.8*  --   HGB 9.5* 9.5*  9.5*  HCT 30.6* 28.0*  28.0*  PLT 141*  --   MCV 91.9  --   MCH 28.5  --   MCHC 31.0  --   RDW 14.6  --   LYMPHSABS 3.8  --   MONOABS 0.9  --   EOSABS 0.0  --   BASOSABS 0.0  --    ------------------------------------------------------------------------------------------------------------------  Results for orders placed or performed during the hospital encounter of 12/23/20 (from the past 48 hour(s))  SARS Coronavirus 2 by RT PCR (hospital order, performed in [facility] hospital lab) Nasopharyngeal Nasopharyngeal Swab     Status: Abnormal   Collection Time: 12/23/20  5:19 PM   Specimen: Nasopharyngeal Swab  Result Value Ref Range   SARS Coronavirus 2 POSITIVE (A) NEGATIVE    Comment: RESULT CALLED TO, READ BACK BY AND VERIFIED WITH: RN R HARDY [id number] AT 1856 BY CM (NOTE) SARS-CoV-2 target nucleic acids are DETECTED  SARS-CoV-2 RNA is generally detectable in upper respiratory specimens  during the acute phase of infection.  Positive results are indicative  of the presence of the identified virus, but do not rule out bacterial infection or co-infection with other pathogens not detected by the test.  Clinical correlation with patient history and  other diagnostic information is necessary to determine patient infection status.  The expected result is negative.  Fact Sheet for Patients:   StrictlyIdeas.no   Fact Sheet for Healthcare Providers:   BankingDealers.co.za    This test is not yet approved or cleared by the [place] FDA and  has been authorized for detection and/or  diagnosis of SARS-CoV-2 by FDA under an Emergency Use Authorization (EUA).  This EUA will remain in effect (meaning this test  can be used) for the duration of  the COVID-19 declaration under Section 564(b)(1) of the Act, 21 U.S.C. section 360-bbb-3(b)(1), unless the  authorization is terminated or revoked sooner.  Performed at [facility], [place] [address] [place], [place] [address]   Comprehensive metabolic panel     Status: Abnormal   Collection Time: 12/23/20  5:44 PM  Result Value Ref Range   Sodium 139 135 - 145 mmol/L   Potassium 4.0 3.5 - 5.1 mmol/L   Chloride 99 98 - 111 mmol/L   CO2 25 22 - 32 mmol/L   Glucose, Bld 119 (H) 70 - 99 mg/dL    Comment: Glucose reference range applies only to samples taken after fasting for at least 8 hours.   BUN 51 (H) 6 - 20 mg/dL   Creatinine, Ser 7.25 (H) 0.44 - 1.00 mg/dL   Calcium 7.2 (L) 8.9 - 10.3 mg/dL   Total Protein 5.8 (L) 6.5 - 8.1 g/dL   Albumin 2.4 (L) 3.5 - 5.0 g/dL   AST 28 15 - 41 U/L   ALT 29 0 - 44 U/L   Alkaline Phosphatase 124 38 - 126 U/L   Total Bilirubin 0.6 0.3 - 1.2 mg/dL   GFR, Estimated 7 (L) >60 mL/min    Comment: (NOTE) Calculated using the CKD-EPI Creatinine Equation (2021)    Anion gap 15 5 - 15    Comment: Performed at [facility], [place] [address] [place], [place] [address]  Troponin I (High Sensitivity)     Status: Abnormal   Collection Time: 12/23/20  5:44 PM  Result Value Ref Range   Troponin I (High Sensitivity) 192 (HH) <18 ng/L    Comment: CRITICAL RESULT CALLED TO, READ BACK BY AND VERIFIED WITHBenard Halsted RN [phone] K FORSYTH (NOTE) Elevated high sensitivity troponin I (hsTnI) values and significant  changes across serial measurements may suggest ACS but many other  chronic and acute conditions are known to elevate hsTnI results.  Refer to the Links section for chest pain algorithms and additional  guidance. Performed at [facility], [place] [address] [place], [place] [address]   Brain natriuretic peptide     Status: Abnormal   Collection Time: 12/23/20  5:44 PM  Result Value Ref Range   B Natriuretic Peptide >4,500.0 (H) 0.0 - 100.0 pg/mL    Comment: Performed at [facility] [address] [place], [place] [address]  CBC  with Differential     Status: Abnormal   Collection Time: 12/23/20  5:44 PM  Result Value Ref Range   WBC 12.8 (H) 4.0 - 10.5 K/uL   RBC 3.33 (L) 3.87 - 5.11 MIL/uL   Hemoglobin 9.5 (L) 12.0 - 15.0 g/dL   HCT 30.6 (L) 36.0 - 46.0 %   MCV 91.9 80.0 - 100.0 fL   MCH 28.5 26.0 - 34.0 pg   MCHC 31.0 30.0 - 36.0 g/dL   RDW 14.6 11.5 - 15.5 %   Platelets 141 (L) 150 - 400 K/uL   nRBC 0.0 0.0 - 0.2 %   Neutrophils Relative % 62 %   Neutro Abs 8.0 (H) 1.7 - 7.7 K/uL   Lymphocytes Relative 30 %   Lymphs Abs 3.8 0.7 - 4.0 K/uL   Monocytes Relative 7 %   Monocytes Absolute 0.9 0.1 - 1.0 K/uL  Eosinophils Relative 0 %   Eosinophils Absolute 0.0 0.0 - 0.5 K/uL   Basophils Relative 0 %   Basophils Absolute 0.0 0.0 - 0.1 K/uL   Immature Granulocytes 1 %   Abs Immature Granulocytes 0.10 (H) 0.00 - 0.07 K/uL    Comment: Performed at [facility] [address], [place], [place] [address]  I-Stat beta hCG blood, ED     Status: None   Collection Time: 12/23/20  5:50 PM  Result Value Ref Range   I-stat hCG, quantitative <5.0 <5 mIU/mL   Comment 3            Comment:   GEST. AGE      CONC.  (mIU/mL)   <=1 WEEK        5 - 50     2 WEEKS       50 - 500     3 WEEKS       100 - 10,000     4 WEEKS     1,000 - 30,000        FEMALE AND NON-PREGNANT FEMALE:     LESS THAN 5 mIU/mL   I-stat chem 8, ED (not at [facility] or [facility])     Status: Abnormal   Collection Time: 12/23/20  5:53 PM  Result Value Ref Range   Sodium 138 135 - 145 mmol/L   Potassium 4.0 3.5 - 5.1 mmol/L   Chloride 100 98 - 111 mmol/L   BUN 49 (H) 6 - 20 mg/dL   Creatinine, Ser 7.20 (H) 0.44 - 1.00 mg/dL   Glucose, Bld 114 (H) 70 - 99 mg/dL    Comment: Glucose reference range applies only to samples taken after fasting for at least 8 hours.   Calcium, Ion 0.94 (L) 1.15 - 1.40 mmol/L   TCO2 26 22 - 32 mmol/L   Hemoglobin 9.5 (L) 12.0 - 15.0 g/dL   HCT 28.0 (L) 36.0 - 46.0 %  I-Stat venous blood gas, ED     Status: Abnormal    Collection Time: 12/23/20  5:53 PM  Result Value Ref Range   pH, Ven 7.374 7.250 - 7.430   pCO2, Ven 47.1 44.0 - 60.0 mmHg   pO2, Ven 22.0 (LL) 32.0 - 45.0 mmHg   Bicarbonate 27.5 20.0 - 28.0 mmol/L   TCO2 29 22 - 32 mmol/L   O2 Saturation 35.0 %   Acid-Base Excess 2.0 0.0 - 2.0 mmol/L   Sodium 138 135 - 145 mmol/L   Potassium 4.0 3.5 - 5.1 mmol/L   Calcium, Ion 0.94 (L) 1.15 - 1.40 mmol/L   HCT 28.0 (L) 36.0 - 46.0 %   Hemoglobin 9.5 (L) 12.0 - 15.0 g/dL   Sample type VENOUS    Comment NOTIFIED PHYSICIAN     Chemistries  Recent Labs  Lab 12/23/20 1744 12/23/20 1753  NA 139 138  138  K 4.0 4.0  4.0  CL 99 100  CO2 25  --   GLUCOSE 119* 114*  BUN 51* 49*  CREATININE 7.25* 7.20*  CALCIUM 7.2*  --   AST 28  --   ALT 29  --   ALKPHOS 124  --   BILITOT 0.6  --    ------------------------------------------------------------------------------------------------------------------  ------------------------------------------------------------------------------------------------------------------ GFR: Estimated Creatinine Clearance: 12.1 mL/min (A) (by C-G formula based on SCr of 7.2 mg/dL (H)). Liver Function Tests: Recent Labs  Lab 12/23/20 1744  AST 28  ALT 29  ALKPHOS 124  BILITOT 0.6  PROT 5.8*  ALBUMIN 2.4*   No results for input(s): LIPASE, AMYLASE in the last 168 hours. No results for input(s): AMMONIA in the last 168 hours. Coagulation Profile: No results for input(s): INR, PROTIME in the last 168 hours. Cardiac Enzymes: No results for input(s): CKTOTAL, CKMB, CKMBINDEX, TROPONINI in the last 168 hours. BNP (last 3 results) No results for input(s): PROBNP in the last 8760 hours. HbA1C: No results for input(s): HGBA1C in the last 72 hours. CBG: No results for input(s): GLUCAP in the last 168 hours. Lipid Profile: No results for input(s): CHOL, HDL, LDLCALC, TRIG, CHOLHDL, LDLDIRECT in the last 72 hours. Thyroid Function Tests: No results for input(s):  TSH, T4TOTAL, FREET4, T3FREE, THYROIDAB in the last 72 hours. Anemia Panel: No results for input(s): VITAMINB12, FOLATE, FERRITIN, TIBC, IRON, RETICCTPCT in the last 72 hours.  --------------------------------------------------------------------------------------------------------------- Urine analysis:    Component Value Date/Time   COLORURINE YELLOW 11/29/2020 2054   APPEARANCEUR HAZY (A) 11/29/2020 2054   LABSPEC 1.015 11/29/2020 2054   PHURINE 6.0 11/29/2020 2054   GLUCOSEU NEGATIVE 11/29/2020 2054   HGBUR MODERATE (A) 11/29/2020 2054   BILIRUBINUR NEGATIVE 11/29/2020 2054   KETONESUR NEGATIVE 11/29/2020 2054   PROTEINUR >=300 (A) 11/29/2020 2054   UROBILINOGEN 0.2 10/17/2020 1026   NITRITE NEGATIVE 11/29/2020 2054   LEUKOCYTESUR NEGATIVE 11/29/2020 2054      Imaging Results:    DG Chest Port 1 View  Result Date: 12/23/2020 CLINICAL DATA:  40 year old female with history of shortness of breath. EXAM: PORTABLE CHEST 1 VIEW COMPARISON:  Chest x-ray 11/29/2020. FINDINGS: Right internal jugular PermCath with tip terminating at the superior cavoatrial junction. There is cephalization of the pulmonary vasculature, indistinctness of the interstitial markings, and patchy airspace disease throughout the lungs bilaterally suggestive of moderate pulmonary edema. Moderate bilateral pleural effusions. Bibasilar areas of atelectasis. Mild cardiomegaly. IMPRESSION: 1. The appearance of the chest is most suggestive of fluid overload and/or heart failure, as detailed above. 2. Bibasilar areas of atelectasis. Electronically Signed   By: Vinnie Langton M.D.   On: 12/23/2020 18:11    My personal review of EKG: Rhythm ST, Rate 102/min, no contiguous ST segment changes   Assessment & Plan:    Principal Problem:   Flash pulmonary edema (HCC) Active Problems:   Hypertensive emergency   CHF exacerbation (HCC)   ESRD (end stage renal disease) (HCC)   Elevated troponin   COVID-19 virus  infection   1. Acute hypoxic Respiratory failure 1. 2/2 Pulm edema 2. Requiring NRB 3. VBG shows O2 22 4. Refused BiPAP 5. Continue lasix, treat as below 2. Hypertensive emergency 1. 2/2 Non adherence to home medications 2. Continue nitro drip 3. Restart home BP medications 4. Cr 7.2, Trop 192, pulm edema on CXR 3. CHF exacerbation 1. Last echo 55-60% with grade II diastolic HF [id number] 2. Flash pulm edema on CXR 3. '160mg'$  lasix in ED 4. Continue '80mg'$  lasix IV BID  5. Strict intake and output 6. Fluid restrictions 7. Continue to monitor 4. Flash pulm edema 1. 2/2 CHF and Hypertensive emergency 2. See plan above 5. ESRD 1. Consult nephro from ED - plan for HD in the AM 6. Leukocytosis 1. Acute phase reactant? 2/2 to Covid? 2. Continue to monitor 7. Anxiety, Bipolar depression 1. No SSRI for anxiety 2/2 bipolar depression 2. Ativan as needed  3. Continue to monitor 8. Covid infection 1. Remdesivir and Steroids 2. Monitor inflammatory markers 9. Elevated trop 1. In the setting of ESRD and hypoxic resp failure 2.  Chest pain free at the time of exam 3. EKG without contiguous ST changes 4. Repeat EKG in the AM 5. Continue to trend troponin 10.    DVT Prophylaxis-  heparin - SCDs   AM Labs Ordered, also please review Full Orders  Family Communication: No family at bedside Code Status:  Full  Admission status: Inpatient :The appropriate admission status for this patient is INPATIENT. Inpatient status is judged to be reasonable and necessary in order to provide the required intensity of service to ensure the patient's safety. The patient's presenting symptoms, physical exam findings, and initial radiographic and laboratory data in the context of their chronic comorbidities is felt to place them at high risk for further clinical deterioration. Furthermore, it is not anticipated that the patient will be medically stable for discharge from the hospital within 2 midnights of  admission. The following factors support the admission status of inpatient.     The patient's presenting symptoms include Dyspnea The worrisome physical exam findings include Wheezing, hypoxia req NRB The initial radiographic and laboratory data are worrisome because of Pulm edema, Cr 7.2 The chronic co-morbidities include Lupus, ESRD, HTN       * I certify that at the point of admission it is my clinical judgment that the patient will require inpatient hospital care spanning beyond 2 midnights from the point of admission due to high intensity of service, high risk for further deterioration and high frequency of surveillance required.*  Time spent in minutes : [place]

## 2020-12-23 NOTE — ED Notes (Signed)
Dorann Ou little brother would like an update [phone]

## 2020-12-24 ENCOUNTER — Encounter: Payer: Medicaid Other | Admitting: Hematology and Oncology

## 2020-12-24 ENCOUNTER — Ambulatory Visit: Payer: Medicaid Other

## 2020-12-24 ENCOUNTER — Inpatient Hospital Stay (HOSPITAL_COMMUNITY): Payer: Medicaid Other

## 2020-12-24 ENCOUNTER — Other Ambulatory Visit: Payer: Medicaid Other

## 2020-12-24 DIAGNOSIS — J810 Acute pulmonary edema: Secondary | ICD-10-CM | POA: Diagnosis not present

## 2020-12-24 DIAGNOSIS — J9690 Respiratory failure, unspecified, unspecified whether with hypoxia or hypercapnia: Secondary | ICD-10-CM | POA: Diagnosis present

## 2020-12-24 LAB — COMPREHENSIVE METABOLIC PANEL
ALT: 23 U/L (ref 0–44)
AST: 21 U/L (ref 15–41)
Albumin: 1.9 g/dL — ABNORMAL LOW (ref 3.5–5.0)
Alkaline Phosphatase: 95 U/L (ref 38–126)
Anion gap: 14 (ref 5–15)
BUN: 57 mg/dL — ABNORMAL HIGH (ref 6–20)
CO2: 25 mmol/L (ref 22–32)
Calcium: 6.6 mg/dL — ABNORMAL LOW (ref 8.9–10.3)
Chloride: 99 mmol/L (ref 98–111)
Creatinine, Ser: 7.58 mg/dL — ABNORMAL HIGH (ref 0.44–1.00)
GFR, Estimated: 7 mL/min — ABNORMAL LOW (ref >60)
Glucose, Bld: 139 mg/dL — ABNORMAL HIGH (ref 70–99)
Potassium: 4.6 mmol/L (ref 3.5–5.1)
Sodium: 138 mmol/L (ref 135–145)
Total Bilirubin: 0.8 mg/dL (ref 0.3–1.2)
Total Protein: 4.7 g/dL — ABNORMAL LOW (ref 6.5–8.1)

## 2020-12-24 LAB — HEPARIN LEVEL (UNFRACTIONATED)
Heparin Unfractionated: 0.61 IU/mL (ref 0.30–0.70)
Heparin Unfractionated: 0.56 IU/mL (ref 0.30–0.70)

## 2020-12-24 LAB — CBC WITH DIFFERENTIAL/PLATELET
Abs Immature Granulocytes: 0.04 10*3/uL (ref 0.00–0.07)
Basophils Absolute: 0 10*3/uL (ref 0.0–0.1)
Basophils Relative: 0 %
Eosinophils Absolute: 0 10*3/uL (ref 0.0–0.5)
Eosinophils Relative: 0 %
HCT: 22.6 % — ABNORMAL LOW (ref 36.0–46.0)
Hemoglobin: 7.4 g/dL — ABNORMAL LOW (ref 12.0–15.0)
Immature Granulocytes: 1 %
Lymphocytes Relative: 19 %
Lymphs Abs: 0.8 10*3/uL (ref 0.7–4.0)
MCH: 29.7 pg (ref 26.0–34.0)
MCHC: 32.7 g/dL (ref 30.0–36.0)
MCV: 90.8 fL (ref 80.0–100.0)
Monocytes Absolute: 0.3 10*3/uL (ref 0.1–1.0)
Monocytes Relative: 6 %
Neutro Abs: 3.3 10*3/uL (ref 1.7–7.7)
Neutrophils Relative %: 74 %
Platelets: 86 10*3/uL — ABNORMAL LOW (ref 150–400)
RBC: 2.49 MIL/uL — ABNORMAL LOW (ref 3.87–5.11)
RDW: 14.4 % (ref 11.5–15.5)
WBC: 4.5 10*3/uL (ref 4.0–10.5)
nRBC: 0 % (ref 0.0–0.2)

## 2020-12-24 LAB — MAGNESIUM: Magnesium: 1.8 mg/dL (ref 1.7–2.4)

## 2020-12-24 LAB — TROPONIN I (HIGH SENSITIVITY): Troponin I (High Sensitivity): 371 ng/L (ref <18)

## 2020-12-24 LAB — D-DIMER, QUANTITATIVE: D-Dimer, Quant: 4.9 ug/mL-FEU — ABNORMAL HIGH (ref 0.00–0.50)

## 2020-12-24 LAB — FERRITIN: Ferritin: 981 ng/mL — ABNORMAL HIGH (ref 11–307)

## 2020-12-24 LAB — C-REACTIVE PROTEIN: CRP: 3.3 mg/dL — ABNORMAL HIGH (ref <1.0)

## 2020-12-24 MED ORDER — TECHNETIUM TO 99M ALBUMIN AGGREGATED
4.2000 | Freq: Once | INTRAVENOUS | Status: AC | PRN
  Administered 2020-12-24: 4.2 via INTRAVENOUS

## 2020-12-24 MED ORDER — HEPARIN SODIUM (PORCINE) 1000 UNIT/ML IJ SOLN
INTRAMUSCULAR | Status: AC
  Filled 2020-12-24: qty 4

## 2020-12-24 NOTE — ED Notes (Signed)
+  SDU Breakfast Order Placed

## 2020-12-24 NOTE — Progress Notes (Addendum)
Pt admitted to 5W room 25.  Oriented to room and call bell.  Tele set up and verified.  Vitals charted. Pt saturating well on 2 L [place].  Pt stated she has not had the Covid vaccination nor flu vaccination but is interested in receiving both.  Pt. Has heparin gtt infusing; IV sites are patent. Call bell within reach, and BSC provided. Encouraged pt to call for any assistance needed and plan of care reviewed. All questions answered.

## 2020-12-24 NOTE — Progress Notes (Signed)
[facility] Progress Note  Subjective: seen in ED, no new c/o's. Legs are swollen but "much better" than before starting HD.   Admit history: 40 y.o. 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  In review of the records, pt presented to HD on 1/15 and then on 1/21. Then came to ER visibly SOB -  CXR showing pulm edema but also COVID positive - we were asked to provide emergent HD due to her O2 req and malignant HTN. We were not able to provide HD last night and she is on for this morning.   Vitals:   12/24/20 1345 12/24/20 1400 12/24/20 1413 12/24/20 1425  BP:   118/88 (!) 120/91  Pulse: 64 67    Resp: (!) 22 (!) 24 (!) 21 (!) 22  Temp:   98.5 F (36.9 C)   TempSrc:   Oral   SpO2: 100% 100% 100%     Exam:   alert, nad   no jvd  Chest bilat rales 1/2 up post and rhonchi, mild ^wob  Cor reg no RG  Abd soft ntnd no ascites   Ext bilat 2+ pitting LE edema   Alert, NF, ox3    RIJ TDC placed 12/31 by IR     OP HD: T[facility] [place]  , AKI designation  4h  85kg  3K/2.5 bath  Hep none TDC     Assessment/ Plan: 1. Acute hypoxic resp failure - 2/2 pulm edema and/or COVID pna. Planning this am, UF 2- 2.5 L as tolerated and watch for response. Get standing wt when possible.  2. HTN urgency - lower vol w/ HD, give home meds 3. 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 AKI designation. Getting po pred 40/ d starting 1/07 and IV cytoxan 500 mg q 2 wks (1st dose 1/07) x 6 doses. Overdue for 2nd dose cytoxan but w/ COVID infection and falling WBC will hold off for now. No sign of recovery yet w/ creat 7- 9 range. HD today as above. Then  HD wed off sched.  4. SLE - as above 5. Bipolar d/o  [name] 12/24/2020, 3:40 PM   Recent Labs  Lab 12/23/20 1744 12/23/20 1753 12/24/20 0530  K 4.0 4.0  4.0 4.6  BUN 51* 49* 57*  CREATININE 7.25* 7.20* 7.58*  CALCIUM 7.2*  --  6.6*  HGB 9.5* 9.5*  9.5* 7.4*   Inpatient medications: . albuterol  1-2 puff Inhalation BID  . amLODipine  10 mg Oral Daily  . Chlorhexidine Gluconate Cloth  6 each Topical Q0600  . cholecalciferol  1,000 Units Oral Daily  . cloNIDine  0.2 mg Oral TID  . fenofibrate  160 mg Oral Daily  . folic acid  1 mg Oral Daily  . guaiFENesin  600 mg Oral BID  . heparin sodium (porcine)      . ipratropium  2 puff Inhalation Q6H  . labetalol  400 mg Oral BID  . methylPREDNISolone (SOLU-MEDROL) injection  0.5 mg/kg Intravenous Q12H   Followed by  . [START ON 12/26/2020] predniSONE  50 mg Oral Daily  .  vitamin B-12  100 mcg Oral Daily   . furosemide Stopped (12/24/20 0326)  . heparin 1,300 Units/hr (12/24/20 1312)  . nitroGLYCERIN 150 mcg/min (12/24/20 0631)  . remdesivir 100 mg in NS 100 mL     acetaminophen, LORazepam, ondansetron **OR** ondansetron (ZOFRAN) IV, oxyCODONE, polyethylene glycol

## 2020-12-24 NOTE — Progress Notes (Signed)
ANTICOAGULATION CONSULT NOTE   Pharmacy Consult for heparin Indication: COVID infection, elevated d-dimer  No Known Allergies  Patient Measurements: Weight:  (pt on ED stretcher) Heparin Dosing Weight: 80kg  Vital Signs: Temp: 98.5 F (36.9 C) (01/24 1413) Temp Source: Oral (01/24 1413) BP: 120/91 (01/24 1425) Pulse Rate: 67 (01/24 1400)  Labs: Recent Labs    12/23/20 1744 12/23/20 1753 12/23/20 2000 12/23/20 2240 12/24/20 0530 12/24/20 1520  HGB 9.5* 9.5*  9.5*  --   --  7.4*  --   HCT 30.6* 28.0*  28.0*  --   --  22.6*  --   PLT 141*  --   --   --  86*  --   HEPARINUNFRC  --   --   --   --  0.61 0.56  CREATININE 7.25* 7.20*  --   --  7.58*  --   TROPONINIHS 192*  --  269* 371*  --   --     Assessment: 30 YOF presenting with SOB, COVID +, d-dimer 8.38.  She is not on anticoagulation PTA,  Chronic anemia stable. Heparin level 0.56 units/ml  Goal of Therapy:  Heparin level 0.3-0.7 units/ml Monitor platelets by anticoagulation protocol: Yes   Plan:  Continue heparin at 1300 units/hr F/u heparin level with am labs F/u DVT/PE workup  Thanks for allowing pharmacy to be a part of this patient's care.  Alanda Slim, PharmD, [facility] Clinical Pharmacist Please see AMION for all Pharmacists' Contact Phone Numbers 12/24/2020, 4:08 PM

## 2020-12-24 NOTE — ED Notes (Signed)
Report was given to [place], Therapist, sports for HD. Pt transported to [facility] on the monitor.

## 2020-12-24 NOTE — Procedures (Signed)
   I was present at this dialysis session, have reviewed the session itself and made  appropriate changes [name] MD [place] pager [phone]   12/24/2020, 3:53 PM

## 2020-12-24 NOTE — Progress Notes (Signed)
[facility] for heparin Indication: COVID infection, elevated d-dimer  No Known Allergies  Patient Measurements:   Heparin Dosing Weight: 80kg  Vital Signs: BP: 133/89 (01/24 0415) Pulse Rate: 68 (01/24 0415)  Labs: Recent Labs    12/23/20 1744 12/23/20 1753 12/23/20 2000 12/23/20 2240 12/24/20 0530  HGB 9.5* 9.5*  9.5*  --   --   --   HCT 30.6* 28.0*  28.0*  --   --   --   PLT 141*  --   --   --   --   HEPARINUNFRC  --   --   --   --  0.61  CREATININE 7.25* 7.20*  --   --  7.58*  TROPONINIHS 192*  --  269* 371*  --     Assessment: 27 YOF presenting with SOB, COVID +, d-dimer 8.38.  She is not on anticoagulation PTA,  Chronic anemia stable. Heparin level 0.61 units/ml  Goal of Therapy:  Heparin level 0.3-0.7 units/ml Monitor platelets by anticoagulation protocol: Yes   Plan:  Continue heparin at 1300 units/hr F/u heparin level in 6 hours F/u DVT/PE workup  Thanks for allowing pharmacy to be a part of this patient's care.  Excell Seltzer, PharmD Clinical Pharmacist  12/24/2020 6:20 AM

## 2020-12-24 NOTE — ED Notes (Signed)
Pt transported to [facility] for perfusion study.

## 2020-12-24 NOTE — Progress Notes (Signed)
PROGRESS NOTE    Cathy [name]  [id number] DOB: [birth year] DOA: 12/23/2020 PCP: Patient, No Pcp Per   Brief Narrative: Taken from H&P Cathy [name]  is a 40 y.o. 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 Patient reports that she has felt short of breath for the last two days, but it became acutely worse today. Patient reports that for the last two days, exertion and laying flat would make the shortness of breath worse, resting in a seated position would make it better. She has tried her albuterol inhaler at home without relief. She has had peripheral edema since her last hospitalization, and she is not sure what her dry weight is supposed to be. 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 Patient reports that her normal schedule is T TH S, but because of weather, she was last dialyzed on 1/21. Patient reports that she has not been adherent with her home medications including lasix and BP medications because she could not afford them.  She was started on Cytoxan and received her first dose on 12/08/2020, supposed to get second dose on 12/22/2020 but never received it.  On arrival she was hypoxic initially requiring nonrebreather, later transitioned to nasal cannula.  BNP more than 4500, elevated troponin, chest x-ray more consistent with fluid overload and bibasilar atelectasis.  COVID-19 PCR came back positive.  Received IV Lasix with improvement in her symptoms and started on remdesivir and steroid.  Subjective: Patient was feeling little improved when seen today.  No new complaint.  Waiting for her dialysis.  Assessment & Plan:   Principal Problem:   Flash pulmonary edema (HCC) Active Problems:   Hypertensive emergency   CHF exacerbation (HCC)   ESRD (end stage renal disease) (HCC)   Elevated troponin   COVID-19 virus  infection  Acute hypoxic respiratory failure secondary to pulmonary edema and COVID-19.  Perfusion studies negative for PE.  Elevated inflammatory markers which started improving.  Patient missed dialysis and appear quite volume overload.  Respiratory status seems improving. -Continue with remdesivir and steroid-day 2 -Continue to monitor inflammatory markers -Continue with supplemental oxygen to keep the saturation above 90%-wean as tolerated. -Continue with supportive care and supplements  CHF exacerbation.  Recent echocardiogram done in December 2021 with normal EF and grade 2 diastolic dysfunction.  Chest x-ray with concern of flash pulmonary edema and markedly elevated BNP. -Continue with IV Lasix. -Further fluid management with diuresis -Strict intake and output -Daily weight and BNP  Hypertensive emergency.  With pulmonary edema. Blood pressure improved. -We will need medication management as she was not taking her meds due to financial reasons. -Stop nitro infusion. -Continue to monitor. -Continue home meds  Elevated troponin.  No chest pain, can be secondary to demand ischemia. -Repeat troponin tomorrow morning.  ESRD.  Nephrology was consulted and she will be going for dialysis today.  History of anxiety/bipolar depression.  No acute concern -Continue with as needed Ativan.  Objective: Vitals:   12/24/20 1413 12/24/20 1425 12/24/20 1610 12/24/20 1715  BP: 118/88 (!) 120/91 (!) 75/61   Pulse:      Resp: (!) 21 (!) 22 19   Temp: 98.5 F (36.9 C)   98.7 F (37.1 C)  TempSrc: Oral   Oral  SpO2: 100%   (P) 100%    Intake/Output Summary (Last 24 hours) at 12/24/2020 1851  Last data filed at 12/24/2020 1715 Gross per 24 hour  Intake --  Output 2432 ml  Net -2432 ml   Filed Weights    Examination:  General exam: Appears calm and comfortable  Respiratory system: Clear to auscultation. Respiratory effort normal. Cardiovascular system: S1 & S2 heard, RRR.   Gastrointestinal system: Soft, nontender, nondistended, bowel sounds positive. Central nervous system: Alert and oriented. No focal neurological deficits. Extremities: 3+ LE edema, no cyanosis, pulses intact and symmetrical. Psychiatry: Judgement and insight appear normal.    DVT prophylaxis: Heparin Code Status: Full Family Communication: Discussed with patient Disposition Plan: Inpatient, most likely will go back home.   Consultants:   Nephrology  Procedures:  Antimicrobials:   Data Reviewed: I have personally reviewed following labs and imaging studies  CBC: Recent Labs  Lab 12/23/20 1744 12/23/20 1753 12/24/20 0530  WBC 12.8*  --  4.5  NEUTROABS 8.0*  --  3.3  HGB 9.5* 9.5*  9.5* 7.4*  HCT 30.6* 28.0*  28.0* 22.6*  MCV 91.9  --  90.8  PLT 141*  --  86*   Basic Metabolic Panel: Recent Labs  Lab 12/23/20 1744 12/23/20 1753 12/24/20 0530  NA 139 138  138 138  K 4.0 4.0  4.0 4.6  CL 99 100 99  CO2 25  --  25  GLUCOSE 119* 114* 139*  BUN 51* 49* 57*  CREATININE 7.25* 7.20* 7.58*  CALCIUM 7.2*  --  6.6*  MG  --   --  1.8   GFR: Estimated Creatinine Clearance: 11.5 mL/min (A) (by C-G formula based on SCr of 7.58 mg/dL (H)). Liver Function Tests: Recent Labs  Lab 12/23/20 1744 12/24/20 0530  AST 28 21  ALT 29 23  ALKPHOS 124 95  BILITOT 0.6 0.8  PROT 5.8* 4.7*  ALBUMIN 2.4* 1.9*   No results for input(s): LIPASE, AMYLASE in the last 168 hours. No results for input(s): AMMONIA in the last 168 hours. Coagulation Profile: No results for input(s): INR, PROTIME in the last 168 hours. Cardiac Enzymes: No results for input(s): CKTOTAL, CKMB, CKMBINDEX, TROPONINI in the last 168 hours. BNP (last 3 results) No results for input(s): PROBNP in the last 8760 hours. HbA1C: No results for input(s): HGBA1C in the last 72 hours. CBG: No results for input(s): GLUCAP in the last 168 hours. Lipid Profile: No results for input(s): CHOL, HDL, LDLCALC, TRIG,  CHOLHDL, LDLDIRECT in the last 72 hours. Thyroid Function Tests: No results for input(s): TSH, T4TOTAL, FREET4, T3FREE, THYROIDAB in the last 72 hours. Anemia Panel: Recent Labs    12/23/20 2030 12/24/20 0530  FERRITIN 1,035* 981*   Sepsis Labs: Recent Labs  Lab 12/23/20 2240  PROCALCITON 4.64    Recent Results (from the past 240 hour(s))  SARS Coronavirus 2 by RT PCR (hospital order, performed in [facility] hospital lab) Nasopharyngeal Nasopharyngeal Swab     Status: Abnormal   Collection Time: 12/23/20  5:19 PM   Specimen: Nasopharyngeal Swab  Result Value Ref Range Status   SARS Coronavirus 2 POSITIVE (A) NEGATIVE Final    Comment: RESULT CALLED TO, READ BACK BY AND VERIFIED WITH: RN R HARDY [id number] AT 1856 BY CM (NOTE) SARS-CoV-2 target nucleic acids are DETECTED  SARS-CoV-2 RNA is generally detectable in upper respiratory specimens  during the acute phase of infection.  Positive results are indicative  of the presence of the identified virus, but do not rule out bacterial infection or co-infection with other pathogens not detected by the  test.  Clinical correlation with patient history and  other diagnostic information is necessary to determine patient infection status.  The expected result is negative.  Fact Sheet for Patients:   StrictlyIdeas.no   Fact Sheet for Healthcare Providers:   BankingDealers.co.za    This test is not yet approved or cleared by the [place] FDA and  has been authorized for detection and/or diagnosis of SARS-CoV-2 by FDA under an Emergency Use Authorization (EUA).  This EUA will remain in effect (meaning this test  can be used) for the duration of  the COVID-19 declaration under Section 564(b)(1) of the Act, 21 U.S.C. section 360-bbb-3(b)(1), unless the authorization is terminated or revoked sooner.  Performed at [facility], [place] [address] [place], [place] [address]       Radiology Studies: NM Pulmonary Perfusion  Result Date: 12/24/2020 CLINICAL DATA:  End-stage renal disease, dyspnea, short of breath for 2 days, COVID-19 positive EXAM: NUCLEAR MEDICINE PERFUSION LUNG SCAN TECHNIQUE: Perfusion images were obtained in multiple projections after intravenous injection of radiopharmaceutical. Ventilation scans intentionally deferred if perfusion scan and chest x-ray adequate for interpretation during COVID 19 epidemic. RADIOPHARMACEUTICALS:  4.2 mCi Tc-44mMAA IV COMPARISON:  12/23/2020 FINDINGS: Planar imaging the chest was performed during the perfusion examination. Decreased radiotracer uptake within the left lower hemithorax corresponds to cardiomegaly, pleural fluid, and left basilar consolidation seen on recent chest x-ray. There are no wedge-shaped perfusion defect to suggest pulmonary embolus. IMPRESSION: 1. No evidence of pulmonary embolism utilizing PISAPED criteria for perfusion only imaging. Decreased radiotracer in the lower left hemithorax corresponds to consolidation, effusion, and cardiomegaly seen on chest x-ray. Electronically Signed   By: MRanda NgoM.D.   On: 12/24/2020 15:04   DG Chest Port 1 View  Result Date: 12/23/2020 CLINICAL DATA:  40year old female with history of shortness of breath. EXAM: PORTABLE CHEST 1 VIEW COMPARISON:  Chest x-ray 11/29/2020. FINDINGS: Right internal jugular PermCath with tip terminating at the superior cavoatrial junction. There is cephalization of the pulmonary vasculature, indistinctness of the interstitial markings, and patchy airspace disease throughout the lungs bilaterally suggestive of moderate pulmonary edema. Moderate bilateral pleural effusions. Bibasilar areas of atelectasis. Mild cardiomegaly. IMPRESSION: 1. The appearance of the chest is most suggestive of fluid overload and/or heart failure, as detailed above. 2. Bibasilar areas of atelectasis. Electronically Signed   By: DVinnie LangtonM.D.   On:  12/23/2020 18:11    Scheduled Meds: . albuterol  1-2 puff Inhalation BID  . amLODipine  10 mg Oral Daily  . Chlorhexidine Gluconate Cloth  6 each Topical Q0600  . cholecalciferol  1,000 Units Oral Daily  . cloNIDine  0.2 mg Oral TID  . fenofibrate  160 mg Oral Daily  . folic acid  1 mg Oral Daily  . guaiFENesin  600 mg Oral BID  . heparin sodium (porcine)      . ipratropium  2 puff Inhalation Q6H  . labetalol  400 mg Oral BID  . methylPREDNISolone (SOLU-MEDROL) injection  0.5 mg/kg Intravenous Q12H   Followed by  . [START ON 12/26/2020] predniSONE  50 mg Oral Daily  . vitamin B-12  100 mcg Oral Daily   Continuous Infusions: . furosemide Stopped (12/24/20 0326)  . heparin 1,300 Units/hr (12/24/20 1312)  . nitroGLYCERIN 150 mcg/min (12/24/20 0631)  . remdesivir 100 mg in NS 100 mL       LOS: 1 day   Time spent: 40 minutes.  SLorella Nimrod MD [facility]  If  7PM-7AM, please contact night-coverage [email or web address]  12/24/2020, 6:51 PM   This record has been created using Systems analyst. Errors have been sought and corrected,but may not always be located. Such creation errors do not reflect on the standard of care.

## 2020-12-25 ENCOUNTER — Encounter (HOSPITAL_COMMUNITY): Payer: Self-pay | Admitting: Internal Medicine

## 2020-12-25 DIAGNOSIS — U071 COVID-19: Secondary | ICD-10-CM | POA: Diagnosis not present

## 2020-12-25 LAB — CBC WITH DIFFERENTIAL/PLATELET
Abs Immature Granulocytes: 0.05 10*3/uL (ref 0.00–0.07)
Basophils Absolute: 0 10*3/uL (ref 0.0–0.1)
Basophils Relative: 0 %
Eosinophils Absolute: 0 10*3/uL (ref 0.0–0.5)
Eosinophils Relative: 0 %
HCT: 23.6 % — ABNORMAL LOW (ref 36.0–46.0)
Hemoglobin: 7.6 g/dL — ABNORMAL LOW (ref 12.0–15.0)
Immature Granulocytes: 1 %
Lymphocytes Relative: 11 %
Lymphs Abs: 0.6 10*3/uL — ABNORMAL LOW (ref 0.7–4.0)
MCH: 29.5 pg (ref 26.0–34.0)
MCHC: 32.2 g/dL (ref 30.0–36.0)
MCV: 91.5 fL (ref 80.0–100.0)
Monocytes Absolute: 0.2 10*3/uL (ref 0.1–1.0)
Monocytes Relative: 4 %
Neutro Abs: 4.6 10*3/uL (ref 1.7–7.7)
Neutrophils Relative %: 84 %
Platelets: 130 10*3/uL — ABNORMAL LOW (ref 150–400)
RBC: 2.58 MIL/uL — ABNORMAL LOW (ref 3.87–5.11)
RDW: 14.2 % (ref 11.5–15.5)
WBC: 5.5 10*3/uL (ref 4.0–10.5)
nRBC: 0 % (ref 0.0–0.2)

## 2020-12-25 LAB — TROPONIN I (HIGH SENSITIVITY)
Troponin I (High Sensitivity): 149 ng/L (ref <18)
Troponin I (High Sensitivity): 126 ng/L (ref <18)

## 2020-12-25 LAB — COMPREHENSIVE METABOLIC PANEL
ALT: 22 U/L (ref 0–44)
AST: 21 U/L (ref 15–41)
Albumin: 2 g/dL — ABNORMAL LOW (ref 3.5–5.0)
Alkaline Phosphatase: 99 U/L (ref 38–126)
Anion gap: 13 (ref 5–15)
BUN: 42 mg/dL — ABNORMAL HIGH (ref 6–20)
CO2: 24 mmol/L (ref 22–32)
Calcium: 6.8 mg/dL — ABNORMAL LOW (ref 8.9–10.3)
Chloride: 98 mmol/L (ref 98–111)
Creatinine, Ser: 5.97 mg/dL — ABNORMAL HIGH (ref 0.44–1.00)
GFR, Estimated: 9 mL/min — ABNORMAL LOW (ref >60)
Glucose, Bld: 169 mg/dL — ABNORMAL HIGH (ref 70–99)
Potassium: 4.5 mmol/L (ref 3.5–5.1)
Sodium: 135 mmol/L (ref 135–145)
Total Bilirubin: 0.5 mg/dL (ref 0.3–1.2)
Total Protein: 4.8 g/dL — ABNORMAL LOW (ref 6.5–8.1)

## 2020-12-25 LAB — MAGNESIUM: Magnesium: 1.9 mg/dL (ref 1.7–2.4)

## 2020-12-25 LAB — FERRITIN: Ferritin: 936 ng/mL — ABNORMAL HIGH (ref 11–307)

## 2020-12-25 LAB — D-DIMER, QUANTITATIVE: D-Dimer, Quant: 3.61 ug/mL-FEU — ABNORMAL HIGH (ref 0.00–0.50)

## 2020-12-25 LAB — C-REACTIVE PROTEIN: CRP: 5 mg/dL — ABNORMAL HIGH (ref <1.0)

## 2020-12-25 LAB — HEPARIN LEVEL (UNFRACTIONATED): Heparin Unfractionated: 0.6 IU/mL (ref 0.30–0.70)

## 2020-12-25 MED ORDER — HEPARIN SODIUM (PORCINE) 5000 UNIT/ML IJ SOLN
5000.0000 [IU] | Freq: Three times a day (TID) | INTRAMUSCULAR | Status: DC
  Administered 2020-12-25 – 2020-12-27 (×5): 5000 [IU] via SUBCUTANEOUS
  Filled 2020-12-25 (×5): qty 1

## 2020-12-25 NOTE — Progress Notes (Signed)
[facility] Progress Note  Subjective: seen in ED, no new c/o's. Breathing better, 2.4 L off w/ HD yest. O2 down to 2 L or RA.   Admit history: 40 y.o. 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  In review of the records, pt presented to HD on 1/15 and then on 1/21. Then came to ER visibly SOB -  CXR showing pulm edema but also COVID positive - we were asked to provide emergent HD due to her O2 req and malignant HTN. We were not able to provide HD last night and she is on for this morning.   Vitals:   12/24/20 2300 12/25/20 0330 12/25/20 0742 12/25/20 1400  BP: (!) 142/105 (!) 137/100 (!) 159/113 (!) 147/106  Pulse: 68 60 65 67  Resp: '17 17 18 17  '$ Temp: 98.6 F (37 C) 98.6 F (37 C) 98.7 F (37.1 C) 98.5 F (36.9 C)  TempSrc: Oral Oral Axillary Oral  SpO2: 100% 100% 100% 100%  Weight: 84.4 kg     Height: '5\' 7"'$  (1.702 m)       Exam:   alert, nad   no jvd  Chest mild rales bilat bases  Cor reg no RG  Abd soft ntnd no ascites   Ext bilat 2+ pitting LE edema   Alert, NF, ox3    RIJ TDC placed 12/31 by IR     OP HD: TTS [place]  , AKI designation  4h  85kg  3K/2.5 bath  Hep none TDC     Assessment/ Plan: 1. Acute hypoxic resp failure - 2/2 pulm edema and/or COVID pna. Got HD yest w/ 2.4 L off and looks better today. Cont to lower vol w/ next HD.  2. HTN urgency - lower vol w/ HD, give home meds 3. ESRD - AKI designation, see below. Recent start 1-2 wks ago. Had HD here yest 1/24. Next HD tomorrow.  4. COVID infection - getting usual meds per pmd 5. 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 AKI designation. Getting po  pred 40/ d starting 1/07 and IV cytoxan 500 mg q 2 wks (1st dose 1/07) x 6 doses. Overdue for 2nd dose cytoxan but w/ COVID infection and falling WBC will hold off for now. No sign of recovery yet w/ creat 7- 9 range.  6. Bipolar d/o  Rob Heru Montz 12/25/2020, 4:28 PM   Recent Labs  Lab 12/24/20 0530 12/25/20 0118  K 4.6 4.5  BUN 57* 42*  CREATININE 7.58* 5.97*  CALCIUM 6.6* 6.8*  HGB 7.4* 7.6*   Inpatient medications: . albuterol  1-2 puff Inhalation BID  . amLODipine  10 mg Oral Daily  . Chlorhexidine Gluconate Cloth  6 each Topical Q0600  . cholecalciferol  1,000 Units Oral Daily  . cloNIDine  0.2 mg Oral TID  . fenofibrate  160 mg Oral Daily  . folic acid  1 mg Oral Daily  . guaiFENesin  600 mg Oral BID  . ipratropium  2 puff Inhalation Q6H  . labetalol  400 mg Oral BID  . methylPREDNISolone (SOLU-MEDROL) injection  0.5 mg/kg Intravenous Q12H  Followed by  . [START ON 12/26/2020] predniSONE  50 mg Oral Daily  . vitamin B-12  100 mcg Oral Daily   . furosemide 160 mg (12/25/20 1555)  . heparin 1,300 Units/hr (12/25/20 0316)  . nitroGLYCERIN Stopped (12/24/20 1900)  . remdesivir 100 mg in NS 100 mL 100 mg (12/25/20 0813)   acetaminophen, LORazepam, ondansetron **OR** ondansetron (ZOFRAN) IV, oxyCODONE, polyethylene glycol

## 2020-12-25 NOTE — Progress Notes (Signed)
Lab called about troponin; level 149; trending down.  Notified MD fyi.

## 2020-12-25 NOTE — Plan of Care (Signed)
  Problem: Education: Goal: Knowledge of risk factors and measures for prevention of condition will improve Outcome: Progressing   Problem: Coping: Goal: Psychosocial and spiritual needs will be supported Outcome: Progressing   Problem: Respiratory: Goal: Will maintain a patent airway Outcome: Progressing   

## 2020-12-25 NOTE — Progress Notes (Signed)
ANTICOAGULATION CONSULT NOTE   Pharmacy Consult for heparin Indication: COVID infection, elevated d-dimer  No Known Allergies  Patient Measurements: Height: '5\' 7"'$  (170.2 cm) Weight: 84.4 kg (186 lb 1.1 oz) IBW/kg (Calculated) : 61.6 Heparin Dosing Weight: 80kg  Vital Signs: Temp: 98.7 F (37.1 C) (01/25 0742) Temp Source: Axillary (01/25 0742) BP: 159/113 (01/25 0742) Pulse Rate: 65 (01/25 0742)  Labs: Recent Labs    12/23/20 1744 12/23/20 1753 12/23/20 2000 12/23/20 2240 12/24/20 0530 12/24/20 1520 12/25/20 0118 12/25/20 0612  HGB 9.5* 9.5*  9.5*  --   --  7.4*  --  7.6*  --   HCT 30.6* 28.0*  28.0*  --   --  22.6*  --  23.6*  --   PLT 141*  --   --   --  86*  --  130*  --   HEPARINUNFRC  --   --   --   --  0.61 0.56  --  0.60  CREATININE 7.25* 7.20*  --   --  7.58*  --  5.97*  --   TROPONINIHS 192*  --    < > 371*  --   --  149* 126*   < > = values in this interval not displayed.    Assessment: 48 YOF presenting with SOB, COVID +, d-dimer 8.38>>3.61.  She is not on anticoagulation PTA,  Chronic anemia stable. Heparin level 0.60 units/ml  Goal of Therapy:  Heparin level 0.3-0.7 units/ml Monitor platelets by anticoagulation protocol: Yes   Plan:  Continue heparin at 1300 units/hr F/u heparin level with am labs F/u DVT/PE workup  Ginnie Marich A. Levada Dy, PharmD, BCPS, [facility] Clinical Pharmacist  Please utilize Amion for appropriate phone number to reach the unit pharmacist ([place])   12/25/2020, 8:19 AM

## 2020-12-25 NOTE — Progress Notes (Addendum)
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  Patient presented with shortness of breath that started 2 days prior to admission became acutely worse the day of admission.  Patient has been newly diagnosed with lupus nephritis and is started on dialysis.  She have to be able to take her home medications because she could not afford them.  She received a dose of Cytoxan 1/8. Arrival she was hypoxic initially requiring nonrebreather mask subsequently transition to nasal cannula.  BNP 4500, mild elevated troponin, chest x-ray consistent with fluid overload bibasilar atelectasis.  COVID-19 PCR came back positive.     Assessment & Plan:   Principal Problem:   COVID-19 virus infection Active Problems:   Flash pulmonary edema (HCC)   Hypertensive emergency   CHF exacerbation (HCC)   ESRD (end stage renal disease) ([place])   Elevated troponin   Respiratory failure (HCC)   1-Hypoxic respiratory failure secondary to pulmonary edema and COVID-19 pneumonia: Continue her with them severe and IV steroids day  3 Continue with oxygen supplementation Hemodialysis to treat pulmonary edema continue with IV Lasix IV steroids.   2-acute diastolic heart failure exacerbation Continue with IV Lasix Also continue with hemodialysis  3-Hypertensive emergency: Continue with Norvasc, clonidine, labetalol Elevated troponin: Negative chest pain  ESRD on hemodialysis secondary to lupus nephritis: Nephrology following  /Bipolar depression: Continue with Ativan as needed Anemia, anemia of chronic disease: Continue to monitor hemoglobin  Elevated D-dimer, VQ scan negative: Discontinue heparin drip Check doppler  Estimated body mass index is 29.14 kg/m as  calculated from the following:   Height as of this encounter: '5\' 7"'$  (1.702 m).   Weight as of this encounter: 84.4 kg.   DVT prophylaxis: Heparin  Code Status: Full code Family Communication: Care discussed with patient Disposition Plan:  Status is: Inpatient  Remains inpatient appropriate because:IV treatments appropriate due to intensity of illness or inability to take PO   Dispo: The patient is from: Home              Anticipated d/c is to: Home              Anticipated d/c date is: 3 days              Patient currently is not medically stable to d/c.   Difficult to place patient No        Consultants:   Nephrology  Procedures:   None  Antimicrobials:    Subjective: Is breathing a little bit better, report cough.  Objective: Vitals:   12/24/20 1730 12/24/20 2200 12/24/20 2300 12/25/20 0330  BP: (!) 140/105 (!) 139/101 (!) 142/105 (!) 137/100  Pulse:  65 68 60  Resp: 20 (!) '28 17 17  '$ Temp:  98.3 F (36.8 C) 98.6 F (37 C) 98.6 F (37 C)  TempSrc:  Oral Oral Oral  SpO2:  100% 100% 100%  Weight:   84.4 kg   Height:   '5\' 7"'$  (1.702 m)     Intake/Output Summary (Last 24 hours) at 12/25/2020 0741 Last data filed at 12/24/2020 1715 Gross per 24 hour  Intake --  Output 2432 ml  Net -2432 ml   Filed Weights   12/24/20 2300  Weight: 84.4 kg    Examination:  General  exam: Appears calm and comfortable  Respiratory system: Bilateral crackles and rhonchorous Cardiovascular system: S1 & S2 heard, RRR. No JVD, murmurs, rubs, gallops or clicks. No pedal edema. Gastrointestinal system: Abdomen is nondistended, soft and nontender. No organomegaly or masses felt. Normal bowel sounds heard. Central nervous system: Alert and oriented. No focal neurological deficits. Extremities: Symmetric 5 x 5 power. Skin: No rashes, lesions or ulcers Psychiatry: Judgement and insight appear normal. Mood & affect appropriate.     Data Reviewed: I have personally reviewed  following labs and imaging studies  CBC: Recent Labs  Lab 12/23/20 1744 12/23/20 1753 12/24/20 0530 12/25/20 0118  WBC 12.8*  --  4.5 5.5  NEUTROABS 8.0*  --  3.3 4.6  HGB 9.5* 9.5*  9.5* 7.4* 7.6*  HCT 30.6* 28.0*  28.0* 22.6* 23.6*  MCV 91.9  --  90.8 91.5  PLT 141*  --  86* [id number]   Basic Metabolic Panel: Recent Labs  Lab 12/23/20 1744 12/23/20 1753 12/24/20 0530 12/25/20 0118  NA 139 138  138 138 135  K 4.0 4.0  4.0 4.6 4.5  CL 99 100 99 98  CO2 25  --  25 24  GLUCOSE 119* 114* 139* 169*  BUN 51* 49* 57* 42*  CREATININE 7.25* 7.20* 7.58* 5.97*  CALCIUM 7.2*  --  6.6* 6.8*  MG  --   --  1.8 1.9   GFR: Estimated Creatinine Clearance: 14.4 mL/min (A) (by C-G formula based on SCr of 5.97 mg/dL (H)). Liver Function Tests: Recent Labs  Lab 12/23/20 1744 12/24/20 0530 12/25/20 0118  AST '28 21 21  '$ ALT '29 23 22  '$ ALKPHOS 124 95 99  BILITOT 0.6 0.8 0.5  PROT 5.8* 4.7* 4.8*  ALBUMIN 2.4* 1.9* 2.0*   No results for input(s): LIPASE, AMYLASE in the last 168 hours. No results for input(s): AMMONIA in the last 168 hours. Coagulation Profile: No results for input(s): INR, PROTIME in the last 168 hours. Cardiac Enzymes: No results for input(s): CKTOTAL, CKMB, CKMBINDEX, TROPONINI in the last 168 hours. BNP (last 3 results) No results for input(s): PROBNP in the last 8760 hours. HbA1C: No results for input(s): HGBA1C in the last 72 hours. CBG: No results for input(s): GLUCAP in the last 168 hours. Lipid Profile: No results for input(s): CHOL, HDL, LDLCALC, TRIG, CHOLHDL, LDLDIRECT in the last 72 hours. Thyroid Function Tests: No results for input(s): TSH, T4TOTAL, FREET4, T3FREE, THYROIDAB in the last 72 hours. Anemia Panel: Recent Labs    12/24/20 0530 12/25/20 0118  FERRITIN 981* 936*   Sepsis Labs: Recent Labs  Lab 12/23/20 2240  PROCALCITON 4.64    Recent Results (from the past 240 hour(s))  SARS Coronavirus 2 by RT PCR (hospital order, performed  in [facility] hospital lab) Nasopharyngeal Nasopharyngeal Swab     Status: Abnormal   Collection Time: 12/23/20  5:19 PM   Specimen: Nasopharyngeal Swab  Result Value Ref Range Status   SARS Coronavirus 2 POSITIVE (A) NEGATIVE Final    Comment: RESULT CALLED TO, READ BACK BY AND VERIFIED WITH: RN R HARDY [id number] AT 1856 BY CM (NOTE) SARS-CoV-2 target nucleic acids are DETECTED  SARS-CoV-2 RNA is generally detectable in upper respiratory specimens  during the acute phase of infection.  Positive results are indicative  of the presence of the identified virus, but do not rule out bacterial infection or co-infection with other pathogens not detected by the test.  Clinical correlation with patient history and  other diagnostic information is  necessary to determine patient infection status.  The expected result is negative.  Fact Sheet for Patients:   StrictlyIdeas.no   Fact Sheet for Healthcare Providers:   BankingDealers.co.za    This test is not yet approved or cleared by the [place] FDA and  has been authorized for detection and/or diagnosis of SARS-CoV-2 by FDA under an Emergency Use Authorization (EUA).  This EUA will remain in effect (meaning this test  can be used) for the duration of  the COVID-19 declaration under Section 564(b)(1) of the Act, 21 U.S.C. section 360-bbb-3(b)(1), unless the authorization is terminated or revoked sooner.  Performed at [facility], [place] [address] [place], [place] [address]          Radiology Studies: NM Pulmonary Perfusion  Result Date: 12/24/2020 CLINICAL DATA:  End-stage renal disease, dyspnea, short of breath for 2 days, COVID-19 positive EXAM: NUCLEAR MEDICINE PERFUSION LUNG SCAN TECHNIQUE: Perfusion images were obtained in multiple projections after intravenous injection of radiopharmaceutical. Ventilation scans intentionally deferred if perfusion scan and chest x-ray  adequate for interpretation during COVID 19 epidemic. RADIOPHARMACEUTICALS:  4.2 mCi Tc-23mMAA IV COMPARISON:  12/23/2020 FINDINGS: Planar imaging the chest was performed during the perfusion examination. Decreased radiotracer uptake within the left lower hemithorax corresponds to cardiomegaly, pleural fluid, and left basilar consolidation seen on recent chest x-ray. There are no wedge-shaped perfusion defect to suggest pulmonary embolus. IMPRESSION: 1. No evidence of pulmonary embolism utilizing PISAPED criteria for perfusion only imaging. Decreased radiotracer in the lower left hemithorax corresponds to consolidation, effusion, and cardiomegaly seen on chest x-ray. Electronically Signed   By: MRanda NgoM.D.   On: 12/24/2020 15:04   DG Chest Port 1 View  Result Date: 12/23/2020 CLINICAL DATA:  40year old female with history of shortness of breath. EXAM: PORTABLE CHEST 1 VIEW COMPARISON:  Chest x-ray 11/29/2020. FINDINGS: Right internal jugular PermCath with tip terminating at the superior cavoatrial junction. There is cephalization of the pulmonary vasculature, indistinctness of the interstitial markings, and patchy airspace disease throughout the lungs bilaterally suggestive of moderate pulmonary edema. Moderate bilateral pleural effusions. Bibasilar areas of atelectasis. Mild cardiomegaly. IMPRESSION: 1. The appearance of the chest is most suggestive of fluid overload and/or heart failure, as detailed above. 2. Bibasilar areas of atelectasis. Electronically Signed   By: DVinnie LangtonM.D.   On: 12/23/2020 18:11        Scheduled Meds: . albuterol  1-2 puff Inhalation BID  . amLODipine  10 mg Oral Daily  . Chlorhexidine Gluconate Cloth  6 each Topical Q0600  . cholecalciferol  1,000 Units Oral Daily  . cloNIDine  0.2 mg Oral TID  . fenofibrate  160 mg Oral Daily  . folic acid  1 mg Oral Daily  . guaiFENesin  600 mg Oral BID  . ipratropium  2 puff Inhalation Q6H  . labetalol  400 mg  Oral BID  . methylPREDNISolone (SOLU-MEDROL) injection  0.5 mg/kg Intravenous Q12H   Followed by  . [START ON 12/26/2020] predniSONE  50 mg Oral Daily  . vitamin B-12  100 mcg Oral Daily   Continuous Infusions: . furosemide Stopped (12/25/20 0410)  . heparin 1,300 Units/hr (12/25/20 0316)  . nitroGLYCERIN Stopped (12/24/20 1900)  . remdesivir 100 mg in NS 100 mL Stopped (12/24/20 2144)     LOS: 2 days    Time spent: 35 minutes.     BElmarie Shiley MD [facility]   If 7PM-7AM, please contact night-coverage [email or web address]  12/25/2020, 7:41  AM  

## 2020-12-26 ENCOUNTER — Inpatient Hospital Stay: Payer: Medicaid Other | Admitting: Hematology and Oncology

## 2020-12-26 ENCOUNTER — Inpatient Hospital Stay (HOSPITAL_COMMUNITY): Payer: Medicaid Other

## 2020-12-26 ENCOUNTER — Telehealth: Payer: Self-pay | Admitting: Hematology and Oncology

## 2020-12-26 ENCOUNTER — Encounter (HOSPITAL_COMMUNITY): Payer: Self-pay

## 2020-12-26 ENCOUNTER — Inpatient Hospital Stay: Payer: Medicaid Other

## 2020-12-26 DIAGNOSIS — R609 Edema, unspecified: Secondary | ICD-10-CM

## 2020-12-26 DIAGNOSIS — U071 COVID-19: Secondary | ICD-10-CM | POA: Diagnosis not present

## 2020-12-26 LAB — COMPREHENSIVE METABOLIC PANEL
ALT: 19 U/L (ref 0–44)
AST: 17 U/L (ref 15–41)
Albumin: 2 g/dL — ABNORMAL LOW (ref 3.5–5.0)
Alkaline Phosphatase: 92 U/L (ref 38–126)
Anion gap: 13 (ref 5–15)
BUN: 58 mg/dL — ABNORMAL HIGH (ref 6–20)
CO2: 24 mmol/L (ref 22–32)
Calcium: 6.9 mg/dL — ABNORMAL LOW (ref 8.9–10.3)
Chloride: 100 mmol/L (ref 98–111)
Creatinine, Ser: 6.97 mg/dL — ABNORMAL HIGH (ref 0.44–1.00)
GFR, Estimated: 7 mL/min — ABNORMAL LOW (ref >60)
Glucose, Bld: 132 mg/dL — ABNORMAL HIGH (ref 70–99)
Potassium: 4.9 mmol/L (ref 3.5–5.1)
Sodium: 137 mmol/L (ref 135–145)
Total Bilirubin: 0.6 mg/dL (ref 0.3–1.2)
Total Protein: 5 g/dL — ABNORMAL LOW (ref 6.5–8.1)

## 2020-12-26 LAB — CBC WITH DIFFERENTIAL/PLATELET
Abs Immature Granulocytes: 0.18 10*3/uL — ABNORMAL HIGH (ref 0.00–0.07)
Basophils Absolute: 0 10*3/uL (ref 0.0–0.1)
Basophils Relative: 0 %
Eosinophils Absolute: 0 10*3/uL (ref 0.0–0.5)
Eosinophils Relative: 0 %
HCT: 24.1 % — ABNORMAL LOW (ref 36.0–46.0)
Hemoglobin: 7.6 g/dL — ABNORMAL LOW (ref 12.0–15.0)
Immature Granulocytes: 3 %
Lymphocytes Relative: 13 %
Lymphs Abs: 0.8 10*3/uL (ref 0.7–4.0)
MCH: 29.2 pg (ref 26.0–34.0)
MCHC: 31.5 g/dL (ref 30.0–36.0)
MCV: 92.7 fL (ref 80.0–100.0)
Monocytes Absolute: 0.3 10*3/uL (ref 0.1–1.0)
Monocytes Relative: 5 %
Neutro Abs: 4.8 10*3/uL (ref 1.7–7.7)
Neutrophils Relative %: 79 %
Platelets: 152 10*3/uL (ref 150–400)
RBC: 2.6 MIL/uL — ABNORMAL LOW (ref 3.87–5.11)
RDW: 14 % (ref 11.5–15.5)
WBC: 6.1 10*3/uL (ref 4.0–10.5)
nRBC: 0 % (ref 0.0–0.2)

## 2020-12-26 LAB — D-DIMER, QUANTITATIVE: D-Dimer, Quant: 3.88 ug/mL-FEU — ABNORMAL HIGH (ref 0.00–0.50)

## 2020-12-26 LAB — FERRITIN: Ferritin: 948 ng/mL — ABNORMAL HIGH (ref 11–307)

## 2020-12-26 LAB — C-REACTIVE PROTEIN: CRP: 2.3 mg/dL — ABNORMAL HIGH (ref <1.0)

## 2020-12-26 LAB — MAGNESIUM: Magnesium: 1.9 mg/dL (ref 1.7–2.4)

## 2020-12-26 LAB — GLUCOSE, CAPILLARY: Glucose-Capillary: 186 mg/dL — ABNORMAL HIGH (ref 70–99)

## 2020-12-26 MED ORDER — SODIUM CHLORIDE 0.9 % IV SOLN: 100.0000 mL | INTRAVENOUS | Status: DC | PRN

## 2020-12-26 MED ORDER — IPRATROPIUM BROMIDE HFA 17 MCG/ACT IN AERS
2.0000 | INHALATION_SPRAY | Freq: Four times a day (QID) | RESPIRATORY_TRACT | Status: DC | PRN
  Administered 2020-12-26 – 2020-12-29 (×2): 2 via RESPIRATORY_TRACT

## 2020-12-26 MED ORDER — ALTEPLASE 2 MG IJ SOLR: 2.0000 mg | Freq: Once | INTRAMUSCULAR | Status: DC | PRN

## 2020-12-26 MED ORDER — HEPARIN SODIUM (PORCINE) 1000 UNIT/ML IJ SOLN
INTRAMUSCULAR | Status: AC
  Filled 2020-12-26: qty 4

## 2020-12-26 MED ORDER — LIDOCAINE-PRILOCAINE 2.5-2.5 % EX CREA: 1.0000 "application " | TOPICAL_CREAM | CUTANEOUS | Status: DC | PRN

## 2020-12-26 MED ORDER — LIDOCAINE HCL (PF) 1 % IJ SOLN: 5.0000 mL | INTRAMUSCULAR | Status: DC | PRN

## 2020-12-26 MED ORDER — ALBUTEROL SULFATE HFA 108 (90 BASE) MCG/ACT IN AERS
1.0000 | INHALATION_SPRAY | Freq: Two times a day (BID) | RESPIRATORY_TRACT | Status: DC
  Administered 2020-12-27: 2 via RESPIRATORY_TRACT
  Administered 2020-12-27: 1 via RESPIRATORY_TRACT
  Administered 2020-12-28 (×2): 2 via RESPIRATORY_TRACT
  Administered 2020-12-29: 1 via RESPIRATORY_TRACT
  Administered 2020-12-29: 2 via RESPIRATORY_TRACT
  Administered 2020-12-30: 1 via RESPIRATORY_TRACT
  Administered 2020-12-30 – 2020-12-31 (×2): 2 via RESPIRATORY_TRACT
  Filled 2020-12-26: qty 6.7

## 2020-12-26 MED ORDER — PENTAFLUOROPROP-TETRAFLUOROETH EX AERO: 1.0000 "application " | INHALATION_SPRAY | CUTANEOUS | Status: DC | PRN

## 2020-12-26 MED ORDER — IPRATROPIUM BROMIDE HFA 17 MCG/ACT IN AERS
2.0000 | INHALATION_SPRAY | Freq: Two times a day (BID) | RESPIRATORY_TRACT | Status: DC
  Administered 2020-12-26 – 2020-12-31 (×11): 2 via RESPIRATORY_TRACT

## 2020-12-26 MED ORDER — HEPARIN SODIUM (PORCINE) 1000 UNIT/ML DIALYSIS: 1000.0000 [IU] | INTRAMUSCULAR | Status: DC | PRN

## 2020-12-26 NOTE — Progress Notes (Signed)
[facility] Progress Note  Subjective: on 2 L [place] O2, 99% sats. No c/o today.   Admit history: 40 y.o. 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  In review of the records, pt presented to HD on 1/15 and then on 1/21. Then came to ER visibly SOB -  CXR showing pulm edema but also COVID positive - we were asked to provide emergent HD due to her O2 req and malignant HTN. We were not able to provide HD last night and she is on for this morning.   Vitals:   12/26/20 0012 12/26/20 0438 12/26/20 1337 12/26/20 1343  BP: (!) 156/111 (!) 152/119  (!) 170/119  Pulse: 65 62  62  Resp: 13 17    Temp: 98 F (36.7 C) 98.2 F (36.8 C)  98.1 F (36.7 C)  TempSrc: Oral Oral  Oral  SpO2: 100% 100%  100%  Weight:   88.4 kg   Height:        Exam:   alert, nad   no jvd  Chest CTA bilat  Cor reg no RG  Abd soft ntnd no ascites   Ext bilat 2+ pitting LE edema   Alert, NF, ox3    RIJ TDC placed 12/31 by IR     OP HD: [facility] [place]  , AKI designation  4h  85kg  3K/2.5 bath  Hep none TDC     Assessment/ Plan: 1. Acute hypoxic resp failure - 2/2 pulm edema and/or COVID pna. Got HD yest w/ 2.4 L off and looks better today. Cont to lower vol w/ next HD.  2. HTN urgency - lower vol w/ HD, give home meds 3. Vol overload - sig leg edema bilat, improving since starting HD per pt 4. ESRD - AKI designation, see below. Recent start 1-2 wks ago. Had HD here yest 1/24. Next HD today off schedule.  5. 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 AKI designation. Getting po pred 40/ d starting 1/07 and IV cytoxan  500 mg q 2 wks (1st dose 1/07) x 6 doses. Overdue for 2nd dose cytoxan but w/ COVID infection and falling WBC will hold off for now. No sign of recovery yet w/ creat 7- 9 range.  7. Bipolar d/o  Rob Maicee Ullman 12/26/2020, 4:41 PM   Recent Labs  Lab 12/25/20 0118 12/26/20 0224  K 4.5 4.9  BUN 42* 58*  CREATININE 5.97* 6.97*  CALCIUM 6.8* 6.9*  HGB 7.6* 7.6*   Inpatient medications: . albuterol  1-2 puff Inhalation BID  . amLODipine  10 mg Oral Daily  . Chlorhexidine Gluconate Cloth  6 each Topical Q0600  . cholecalciferol  1,000 Units Oral Daily  . cloNIDine  0.2 mg Oral TID  . fenofibrate  160 mg Oral Daily  . folic acid  1 mg Oral Daily  . guaiFENesin  600 mg Oral BID  . heparin injection (subcutaneous)  5,000 Units Subcutaneous Q8H  . heparin sodium (porcine)      . ipratropium  2 puff Inhalation BID  . labetalol  400 mg Oral BID  . predniSONE  50 mg Oral Daily  . vitamin B-12  100 mcg Oral Daily   . sodium chloride    . sodium chloride    . furosemide 160 mg (12/26/20 0802)  . nitroGLYCERIN Stopped (12/24/20 1900)  . remdesivir 100 mg in NS 100 mL 100 mg (12/26/20 1043)   sodium chloride, sodium chloride, acetaminophen, alteplase, heparin, ipratropium, lidocaine (PF), lidocaine-prilocaine, LORazepam, ondansetron **OR** ondansetron (ZOFRAN) IV, oxyCODONE, pentafluoroprop-tetrafluoroeth, polyethylene glycol

## 2020-12-26 NOTE — Progress Notes (Signed)
Lower extremity venous has been completed.   Preliminary results in CV Proc.   Abram Sander 12/26/2020 10:08 AM

## 2020-12-26 NOTE — Telephone Encounter (Signed)
Patient is still in the hospital with [place] . So appointment for today(12/26/20) will be missed. 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

## 2020-12-26 NOTE — Progress Notes (Signed)
SATURATION QUALIFICATIONS: (This note is used to comply with regulatory documentation for home oxygen)  Patient Saturations on Room Air at Rest = 99%  Patient Saturations on Room Air while Ambulating = 92%  Patient did not require any O2 while ambulating in the hall.

## 2020-12-26 NOTE — Plan of Care (Signed)
  Problem: Education: Goal: Knowledge of risk factors and measures for prevention of condition will improve Outcome: Progressing   Problem: Coping: Goal: Psychosocial and spiritual needs will be supported Outcome: Progressing   Problem: Respiratory: Goal: Will maintain a patent airway Outcome: Progressing   

## 2020-12-26 NOTE — Progress Notes (Signed)
Pt saturation has been 100% but does like to use the oxygen for comfort.  Stated she is not ready to not have the oxygen as a comfort.  Instructed patient on the IS and her first use of it she reached 1250 ml in effort. Pt to have HD today.

## 2020-12-26 NOTE — Progress Notes (Signed)
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  Patient presented with shortness of breath that started 2 days prior to admission became acutely worse the day of admission.  Patient has been newly diagnosed with lupus nephritis and is started on dialysis.  She have to be able to take her home medications because she could not afford them.  She received a dose of Cytoxan 1/8. Arrival she was hypoxic initially requiring nonrebreather mask subsequently transition to nasal cannula.  BNP 4500, mild elevated troponin, chest x-ray consistent with fluid overload bibasilar atelectasis.  COVID-19 PCR came back positive.     Assessment & Plan:   Principal Problem:   COVID-19 virus infection Active Problems:   Flash pulmonary edema (HCC)   Hypertensive emergency   CHF exacerbation (HCC)   ESRD (end stage renal disease) ([place])   Elevated troponin   Respiratory failure (HCC)   1-Hypoxic respiratory failure secondary to pulmonary edema and COVID-19 pneumonia: Continue her with them severe and IV steroids day 4. Continue with oxygen supplementation Hemodialysis to treat pulmonary edema continue with IV Lasix IV steroids.   2-Acute diastolic heart failure exacerbation Continue with IV Lasix. Continue with hemodialysis  3-Hypertensive emergency: Continue with Norvasc, clonidine, labetalol Elevated troponin: Negative chest pain Continue with lasix.   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   Elevated D-dimer, VQ scan negative: Discontinue heparin drip Doppler; negative for  DVT   Estimated body mass index is 29.14 kg/m as calculated from the following:   Height as of this encounter: '5\' 7"'$  (1.702 m).   Weight as of this encounter: 84.4 kg.   DVT prophylaxis: Heparin  Code Status: Full code Family Communication: Care discussed with patient Disposition Plan:  Status is: Inpatient  Remains inpatient appropriate because:IV treatments appropriate due to intensity of illness or inability to take PO   Dispo: The patient is from: Home              Anticipated d/c is to: Home              Anticipated d/c date is: 3 days              Patient currently is not medically stable to d/c.   Difficult to place patient No        Consultants:   Nephrology  Procedures:   None  Antimicrobials:    Subjective: Dyspnea improved.  Cough improving.   Objective: Vitals:   12/25/20 1400 12/25/20 2020 12/26/20 0012 12/26/20 0438  BP: (!) 147/106 (!) 148/102 (!) 156/111 (!) 152/119  Pulse: 67 67 65 62  Resp: '17 20 13 17  '$ Temp: 98.5 F (36.9 C) 98.2 F (36.8 C) 98 F (36.7 C) 98.2 F (36.8 C)  TempSrc: Oral Oral Oral Oral  SpO2: 100% 100% 100% 100%  Weight:      Height:        Intake/Output Summary (Last 24 hours) at 12/26/2020 0942 Last data filed at 12/26/2020 0508 Gross per 24 hour  Intake 598 ml  Output 2 ml  Net 596 ml   Filed Weights   12/24/20 2300  Weight: 84.4 kg  Examination:  General exam: NAD Respiratory system; less ronchus Cardiovascular system: S 1, S 2 RRR Gastrointestinal system: BS present, soft, nt. Central nervous system; non focal.  Extremities: no edema Skin: no rashes  Data Reviewed: I have personally reviewed following labs and imaging studies  CBC: Recent Labs  Lab 12/23/20 1744 12/23/20 1753 12/24/20 0530 12/25/20 0118 12/26/20 0224  WBC 12.8*  --  4.5 5.5 6.1  NEUTROABS 8.0*  --  3.3 4.6 4.8  HGB 9.5* 9.5*  9.5* 7.4* 7.6* 7.6*  HCT 30.6* 28.0*  28.0* 22.6* 23.6* 24.1*  MCV 91.9  --  90.8 91.5  92.7  PLT 141*  --  86* 130* 0000000   Basic Metabolic Panel: Recent Labs  Lab 12/23/20 1744 12/23/20 1753 12/24/20 0530 12/25/20 0118 12/26/20 0224  NA 139 138  138 138 135 137  K 4.0 4.0  4.0 4.6 4.5 4.9  CL 99 100 99 98 100  CO2 25  --  '25 24 24  '$ GLUCOSE 119* 114* 139* 169* 132*  BUN 51* 49* 57* 42* 58*  CREATININE 7.25* 7.20* 7.58* 5.97* 6.97*  CALCIUM 7.2*  --  6.6* 6.8* 6.9*  MG  --   --  1.8 1.9 1.9   GFR: Estimated Creatinine Clearance: 12.3 mL/min (A) (by C-G formula based on SCr of 6.97 mg/dL (H)). Liver Function Tests: Recent Labs  Lab 12/23/20 1744 12/24/20 0530 12/25/20 0118 12/26/20 0224  AST '28 21 21 17  '$ ALT '29 23 22 19  '$ ALKPHOS 124 95 99 92  BILITOT 0.6 0.8 0.5 0.6  PROT 5.8* 4.7* 4.8* 5.0*  ALBUMIN 2.4* 1.9* 2.0* 2.0*   No results for input(s): LIPASE, AMYLASE in the last 168 hours. No results for input(s): AMMONIA in the last 168 hours. Coagulation Profile: No results for input(s): INR, PROTIME in the last 168 hours. Cardiac Enzymes: No results for input(s): CKTOTAL, CKMB, CKMBINDEX, TROPONINI in the last 168 hours. BNP (last 3 results) No results for input(s): PROBNP in the last 8760 hours. HbA1C: No results for input(s): HGBA1C in the last 72 hours. CBG: No results for input(s): GLUCAP in the last 168 hours. Lipid Profile: No results for input(s): CHOL, HDL, LDLCALC, TRIG, CHOLHDL, LDLDIRECT in the last 72 hours. Thyroid Function Tests: No results for input(s): TSH, T4TOTAL, FREET4, T3FREE, THYROIDAB in the last 72 hours. Anemia Panel: Recent Labs    12/25/20 0118 12/26/20 0224  FERRITIN 936* 948*   Sepsis Labs: Recent Labs  Lab 12/23/20 2240  PROCALCITON 4.64    Recent Results (from the past 240 hour(s))  SARS Coronavirus 2 by RT PCR (hospital order, performed in [facility] hospital lab) Nasopharyngeal Nasopharyngeal Swab     Status: Abnormal   Collection Time: 12/23/20  5:19 PM   Specimen: Nasopharyngeal Swab  Result Value  Ref Range Status   SARS Coronavirus 2 POSITIVE (A) NEGATIVE Final    Comment: RESULT CALLED TO, READ BACK BY AND VERIFIED WITH: RN R HARDY [id number] AT 1856 BY CM (NOTE) SARS-CoV-2 target nucleic acids are DETECTED  SARS-CoV-2 RNA is generally detectable in upper respiratory specimens  during the acute phase of infection.  Positive results are indicative  of the presence of the identified virus, but do not rule out bacterial infection or co-infection with other pathogens not detected by the test.  Clinical correlation with patient history and  other diagnostic information is necessary to determine patient infection status.  The expected result is negative.  Fact Sheet for Patients:   StrictlyIdeas.no  Fact Sheet for Healthcare Providers:   BankingDealers.co.za    This test is not yet approved or cleared by the [place] FDA and  has been authorized for detection and/or diagnosis of SARS-CoV-2 by FDA under an Emergency Use Authorization (EUA).  This EUA will remain in effect (meaning this test  can be used) for the duration of  the COVID-19 declaration under Section 564(b)(1) of the Act, 21 U.S.C. section 360-bbb-3(b)(1), unless the authorization is terminated or revoked sooner.  Performed at [facility], [place] [address], [place], [place] [address]          Radiology Studies: NM Pulmonary Perfusion  Result Date: 12/24/2020 CLINICAL DATA:  End-stage renal disease, dyspnea, short of breath for 2 days, COVID-19 positive EXAM: NUCLEAR MEDICINE PERFUSION LUNG SCAN TECHNIQUE: Perfusion images were obtained in multiple projections after intravenous injection of radiopharmaceutical. Ventilation scans intentionally deferred if perfusion scan and chest x-ray adequate for interpretation during COVID 19 epidemic. RADIOPHARMACEUTICALS:  4.2 mCi Tc-61mMAA IV COMPARISON:  12/23/2020 FINDINGS: Planar imaging the chest was performed during  the perfusion examination. Decreased radiotracer uptake within the left lower hemithorax corresponds to cardiomegaly, pleural fluid, and left basilar consolidation seen on recent chest x-ray. There are no wedge-shaped perfusion defect to suggest pulmonary embolus. IMPRESSION: 1. No evidence of pulmonary embolism utilizing PISAPED criteria for perfusion only imaging. Decreased radiotracer in the lower left hemithorax corresponds to consolidation, effusion, and cardiomegaly seen on chest x-ray. Electronically Signed   By: MRanda NgoM.D.   On: 12/24/2020 15:04        Scheduled Meds: . albuterol  1-2 puff Inhalation BID  . amLODipine  10 mg Oral Daily  . Chlorhexidine Gluconate Cloth  6 each Topical Q0600  . cholecalciferol  1,000 Units Oral Daily  . cloNIDine  0.2 mg Oral TID  . fenofibrate  160 mg Oral Daily  . folic acid  1 mg Oral Daily  . guaiFENesin  600 mg Oral BID  . heparin injection (subcutaneous)  5,000 Units Subcutaneous Q8H  . ipratropium  2 puff Inhalation BID  . labetalol  400 mg Oral BID  . predniSONE  50 mg Oral Daily  . vitamin B-12  100 mcg Oral Daily   Continuous Infusions: . furosemide 160 mg (12/26/20 0802)  . nitroGLYCERIN Stopped (12/24/20 1900)  . remdesivir 100 mg in NS 100 mL Stopped (12/25/20 0900)     LOS: 3 days    Time spent: 35 minutes.     BElmarie Shiley MD [facility]   If 7PM-7AM, please contact night-coverage [email or web address]  12/26/2020, 9:42 AM

## 2020-12-27 DIAGNOSIS — U071 COVID-19: Secondary | ICD-10-CM | POA: Diagnosis not present

## 2020-12-27 LAB — CBC WITH DIFFERENTIAL/PLATELET
Abs Immature Granulocytes: 0.24 10*3/uL — ABNORMAL HIGH (ref 0.00–0.07)
Basophils Absolute: 0 10*3/uL (ref 0.0–0.1)
Basophils Relative: 0 %
Eosinophils Absolute: 0 10*3/uL (ref 0.0–0.5)
Eosinophils Relative: 0 %
HCT: 24.5 % — ABNORMAL LOW (ref 36.0–46.0)
Hemoglobin: 8.3 g/dL — ABNORMAL LOW (ref 12.0–15.0)
Immature Granulocytes: 4 %
Lymphocytes Relative: 12 %
Lymphs Abs: 0.8 10*3/uL (ref 0.7–4.0)
MCH: 30.4 pg (ref 26.0–34.0)
MCHC: 33.9 g/dL (ref 30.0–36.0)
MCV: 89.7 fL (ref 80.0–100.0)
Monocytes Absolute: 0.4 10*3/uL (ref 0.1–1.0)
Monocytes Relative: 6 %
Neutro Abs: 5.2 10*3/uL (ref 1.7–7.7)
Neutrophils Relative %: 78 %
Platelets: 172 10*3/uL (ref 150–400)
RBC: 2.73 MIL/uL — ABNORMAL LOW (ref 3.87–5.11)
RDW: 14 % (ref 11.5–15.5)
WBC: 6.7 10*3/uL (ref 4.0–10.5)
nRBC: 0.3 % — ABNORMAL HIGH (ref 0.0–0.2)

## 2020-12-27 LAB — COMPREHENSIVE METABOLIC PANEL
ALT: 23 U/L (ref 0–44)
AST: 23 U/L (ref 15–41)
Albumin: 2 g/dL — ABNORMAL LOW (ref 3.5–5.0)
Alkaline Phosphatase: 91 U/L (ref 38–126)
Anion gap: 13 (ref 5–15)
BUN: 50 mg/dL — ABNORMAL HIGH (ref 6–20)
CO2: 24 mmol/L (ref 22–32)
Calcium: 7 mg/dL — ABNORMAL LOW (ref 8.9–10.3)
Chloride: 100 mmol/L (ref 98–111)
Creatinine, Ser: 5.74 mg/dL — ABNORMAL HIGH (ref 0.44–1.00)
GFR, Estimated: 9 mL/min — ABNORMAL LOW (ref >60)
Glucose, Bld: 149 mg/dL — ABNORMAL HIGH (ref 70–99)
Potassium: 4.5 mmol/L (ref 3.5–5.1)
Sodium: 137 mmol/L (ref 135–145)
Total Bilirubin: 0.5 mg/dL (ref 0.3–1.2)
Total Protein: 5.2 g/dL — ABNORMAL LOW (ref 6.5–8.1)

## 2020-12-27 LAB — C-REACTIVE PROTEIN: CRP: 2.5 mg/dL — ABNORMAL HIGH (ref <1.0)

## 2020-12-27 LAB — HEPARIN LEVEL (UNFRACTIONATED): Heparin Unfractionated: 0.73 IU/mL — ABNORMAL HIGH (ref 0.30–0.70)

## 2020-12-27 LAB — FERRITIN: Ferritin: 970 ng/mL — ABNORMAL HIGH (ref 11–307)

## 2020-12-27 LAB — D-DIMER, QUANTITATIVE: D-Dimer, Quant: 12.36 ug/mL-FEU — ABNORMAL HIGH (ref 0.00–0.50)

## 2020-12-27 LAB — MAGNESIUM: Magnesium: 1.8 mg/dL (ref 1.7–2.4)

## 2020-12-27 MED ORDER — HEPARIN (PORCINE) 25000 UT/250ML-% IV SOLN
1150.0000 [IU]/h | INTRAVENOUS | Status: DC
  Administered 2020-12-27: 1300 [IU]/h via INTRAVENOUS
  Administered 2020-12-28 (×2): 1200 [IU]/h via INTRAVENOUS
  Administered 2020-12-29: 1150 [IU]/h via INTRAVENOUS
  Filled 2020-12-27 (×6): qty 250

## 2020-12-27 MED ORDER — HEPARIN BOLUS VIA INFUSION
2500.0000 [IU] | Freq: Once | INTRAVENOUS | Status: AC
  Administered 2020-12-27: 2500 [IU] via INTRAVENOUS
  Filled 2020-12-27: qty 2500

## 2020-12-27 NOTE — Progress Notes (Signed)
ANTICOAGULATION CONSULT NOTE   Pharmacy Consult for heparin Indication: COVID infection, elevated d-dimer  No Known Allergies  Patient Measurements: Height: '5\' 7"'$  (170.2 cm) Weight: 85 kg (187 lb 6.3 oz) IBW/kg (Calculated) : 61.6 Heparin Dosing Weight: 80kg  Vital Signs: Temp: 97.7 F (36.5 C) (01/27 2005) Temp Source: Axillary (01/27 2005) BP: 146/107 (01/27 2005) Pulse Rate: 69 (01/27 2005)  Labs: Recent Labs    12/25/20 0118 12/25/20 0612 12/26/20 0224 12/27/20 0246 12/27/20 1853  HGB 7.6*  --  7.6* 8.3*  --   HCT 23.6*  --  24.1* 24.5*  --   PLT 130*  --  152 172  --   HEPARINUNFRC  --  0.60  --   --  0.73*  CREATININE 5.97*  --  6.97* 5.74*  --   TROPONINIHS 149* 126*  --   --   --     Assessment: 77 YOF presenting with SOB, COVID +, d-dimer 8.38>>3.61>>12.36.  She is not on anticoagulation PTA,  Chronic anemia stable.  HL came back at 0.73. We will decrease slightly and check level in AM.  Goal of Therapy:  Heparin level 0.3-0.7 units/ml Monitor platelets by anticoagulation protocol: Yes   Plan:  Decrease heparin to 1200 units/hr F/u heparin level in AM  Onnie Boer, PharmD, [place], AAHIVP, CPP Infectious Disease Pharmacist 12/27/2020 8:26 PM

## 2020-12-27 NOTE — Progress Notes (Signed)
[facility] Progress Note  Subjective: on 2 L [place] O2, 99% sats. No c/o today.   Admit history: 40 y.o. 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  In review of the records, pt presented to HD on 1/15 and then on 1/21. Then came to ER visibly SOB -  CXR showing pulm edema but also COVID positive - we were asked to provide emergent HD due to her O2 req and malignant HTN. We were not able to provide HD last night and she is on for this morning.   Vitals:   12/26/20 1943 12/26/20 2347 12/27/20 0420 12/27/20 0755  BP: (!) 155/103 (!) 150/116 (!) 149/107 (!) 166/113  Pulse: 63 66 62 61  Resp: '15 15 19 18  '$ Temp: 98.4 F (36.9 C) 97.7 F (36.5 C) 98.2 F (36.8 C) 98.6 F (37 C)  TempSrc: Oral Oral Oral Axillary  SpO2: 99% 100% 99% 100%  Weight:      Height:        Exam:   alert, nad   no jvd  Chest CTA bilat  Cor reg no RG  Abd soft ntnd no ascites   Ext bilat 2+ pitting LE edema   Alert, NF, ox3    RIJ TDC placed 12/31 by IR     OP HD: TTS [place]  , AKI designation  4h  85kg  3K/2.5 bath  Hep none TDC     Assessment/ Plan: 1. Acute hypoxic resp failure - 2/2 pulm edema and/or COVID pna. Got HD here on 1/24 and 1/26, 5.4 L UF in total. Still sig amt of leg edema. Plan next HD on Sat to get back on schedule.  2. HTN urgency - lower vol w/ HD, give home meds 3. Vol overload - sig leg edema bilat, strict fluid restriction explained to pt 4. ESRD - AKI designation, see below. Recent start 1-2 wks ago. As above.  5. 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 AKI designation.  Getting po pred 40/ d starting 1/07 and IV cytoxan 500 mg q 2 wks (1st dose 1/07) x 6 doses. Overdue for 2nd dose cytoxan but w/ COVID infection holding off for now, will check w/ pt's renal MD. No sign of recovery yet w/ creat 7- 9 range.  7. Bipolar d/o  Rob Demichael Traum 12/27/2020, 3:31 PM   Recent Labs  Lab 12/26/20 0224 12/27/20 0246  K 4.9 4.5  BUN 58* 50*  CREATININE 6.97* 5.74*  CALCIUM 6.9* 7.0*  HGB 7.6* 8.3*   Inpatient medications: . albuterol  1-2 puff Inhalation BID  . amLODipine  10 mg Oral Daily  . Chlorhexidine Gluconate Cloth  6 each Topical Q0600  . cholecalciferol  1,000 Units Oral Daily  . cloNIDine  0.2 mg Oral TID  . fenofibrate  160 mg Oral Daily  . folic acid  1 mg Oral Daily  . guaiFENesin  600 mg Oral BID  . ipratropium  2 puff Inhalation BID  . labetalol  400 mg Oral BID  . predniSONE  50 mg Oral  Daily  . vitamin B-12  100 mcg Oral Daily   . furosemide 160 mg (12/27/20 1510)  . heparin 1,300 Units/hr (12/27/20 1500)  . nitroGLYCERIN Stopped (12/24/20 1900)   acetaminophen, ipratropium, LORazepam, ondansetron **OR** ondansetron (ZOFRAN) IV, oxyCODONE, polyethylene glycol

## 2020-12-27 NOTE — Progress Notes (Signed)
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  Patient presented with shortness of breath that started 2 days prior to admission became acutely worse the day of admission.  Patient has been newly diagnosed with lupus nephritis and is started on dialysis.  She have to be able to take her home medications because she could not afford them.  She received a dose of Cytoxan 1/8. Arrival she was hypoxic initially requiring nonrebreather mask subsequently transition to nasal cannula.  BNP 4500, mild elevated troponin, chest x-ray consistent with fluid overload bibasilar atelectasis.  COVID-19 PCR came back positive.     Assessment & Plan:   Principal Problem:   COVID-19 virus infection Active Problems:   Flash pulmonary edema (HCC)   Hypertensive emergency   CHF exacerbation (HCC)   ESRD (end stage renal disease) ([place])   Elevated troponin   Respiratory failure (HCC)   1-Hypoxic respiratory failure secondary to pulmonary edema and COVID-19 pneumonia: Completed Remdesivir and IV steroids 4 days. Now on oral prednisone.  Continue with oxygen supplementation Hemodialysis to treat pulmonary edema continue with IV Lasix D dimer increase today to 12; plan to resume heparin gtt.   2-Acute diastolic heart failure exacerbation Continue with IV Lasix. Continue with hemodialysis  3-Hypertensive emergency: Continue with Norvasc, clonidine, labetalol Elevated troponin: Negative chest pain Continue with lasix.   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   Elevated D-dimer, VQ scan  negative: Discontinue heparin drip 1/25. Doppler; negative for DVT 1/26. D dimer increase to 12 today--plan to resume heparin gtt.    Estimated body mass index is 29.35 kg/m as calculated from the following:   Height as of this encounter: '5\' 7"'$  (1.702 m).   Weight as of this encounter: 85 kg.   DVT prophylaxis: Heparin  Code Status: Full code Family Communication: Care discussed with patient Disposition Plan:  Status is: Inpatient  Remains inpatient appropriate because:IV treatments appropriate due to intensity of illness or inability to take PO   Dispo: The patient is from: Home              Anticipated d/c is to: Home              Anticipated d/c date is: 3 days              Patient currently is not medically stable to d/c.   Difficult to place patient No        Consultants:   Nephrology  Procedures:   None  Antimicrobials:    Subjective: Dyspnea improved. Denies chest pain    Objective: Vitals:   12/26/20 1943 12/26/20 2347 12/27/20 0420 12/27/20 0755  BP: (!) 155/103 (!) 150/116 (!) 149/107 (!) 166/113  Pulse: 63 66 62 61  Resp: '15 15 19 18  '$ Temp: 98.4 F (36.9 C) 97.7 F (36.5 C) 98.2 F (36.8 C) 98.6 F (37 C)  TempSrc: Oral Oral Oral Axillary  SpO2: 99% 100% 99% 100%  Weight:      Height:        Intake/Output Summary (Last 24 hours) at 12/27/2020 1440 Last data filed at 12/27/2020 0900 Gross per 24 hour  Intake 1430.01 ml  Output 3000 ml  Net -1569.99 ml   Filed Weights   12/24/20 2300 12/26/20 1337 12/26/20 1615  Weight: 84.4 kg 88.4 kg 85 kg    Examination:  General exam: NAD Respiratory system; CTA Cardiovascular system: S 1, S 2 RRR Gastrointestinal system: BS present, soft, nt Central nervous system; Non focal.  Extremities: No edema Skin: No rashes.   Data Reviewed: I have personally reviewed following labs and imaging studies  CBC: Recent Labs  Lab 12/23/20 1744 12/23/20 1753 12/24/20 0530 12/25/20 0118  12/26/20 0224 12/27/20 0246  WBC 12.8*  --  4.5 5.5 6.1 6.7  NEUTROABS 8.0*  --  3.3 4.6 4.8 5.2  HGB 9.5* 9.5*  9.5* 7.4* 7.6* 7.6* 8.3*  HCT 30.6* 28.0*  28.0* 22.6* 23.6* 24.1* 24.5*  MCV 91.9  --  90.8 91.5 92.7 89.7  PLT 141*  --  86* 130* 152 [id number]   Basic Metabolic Panel: Recent Labs  Lab 12/23/20 1744 12/23/20 1753 12/24/20 0530 12/25/20 0118 12/26/20 0224 12/27/20 0246  NA 139 138  138 138 135 137 137  K 4.0 4.0  4.0 4.6 4.5 4.9 4.5  CL 99 100 99 98 100 100  CO2 25  --  '25 24 24 24  '$ GLUCOSE 119* 114* 139* 169* 132* 149*  BUN 51* 49* 57* 42* 58* 50*  CREATININE 7.25* 7.20* 7.58* 5.97* 6.97* 5.74*  CALCIUM 7.2*  --  6.6* 6.8* 6.9* 7.0*  MG  --   --  1.8 1.9 1.9 1.8   GFR: Estimated Creatinine Clearance: 15 mL/min (A) (by C-G formula based on SCr of 5.74 mg/dL (H)). Liver Function Tests: Recent Labs  Lab 12/23/20 1744 12/24/20 0530 12/25/20 0118 12/26/20 0224 12/27/20 0246  AST '28 21 21 17 23  '$ ALT '29 23 22 19 23  '$ ALKPHOS 124 95 99 92 91  BILITOT 0.6 0.8 0.5 0.6 0.5  PROT 5.8* 4.7* 4.8* 5.0* 5.2*  ALBUMIN 2.4* 1.9* 2.0* 2.0* 2.0*   No results for input(s): LIPASE, AMYLASE in the last 168 hours. No results for input(s): AMMONIA in the last 168 hours. Coagulation Profile: No results for input(s): INR, PROTIME in the last 168 hours. Cardiac Enzymes: No results for input(s): CKTOTAL, CKMB, CKMBINDEX, TROPONINI in the last 168 hours. BNP (last 3 results) No results for input(s): PROBNP in the last 8760 hours. HbA1C: No results for input(s): HGBA1C in the last 72 hours. CBG: Recent Labs  Lab 12/26/20 1738  GLUCAP 186*   Lipid Profile: No results for input(s): CHOL, HDL, LDLCALC, TRIG, CHOLHDL, LDLDIRECT in the last 72 hours. Thyroid Function Tests: No results for input(s): TSH, T4TOTAL, FREET4, T3FREE, THYROIDAB in the last 72 hours. Anemia Panel: Recent Labs    12/26/20 0224 12/27/20 0246  FERRITIN 948* 970*   Sepsis Labs: Recent Labs  Lab  12/23/20 2240  PROCALCITON 4.64    Recent Results (from the past 240 hour(s))  SARS Coronavirus 2 by RT PCR (hospital order, performed in [facility] hospital lab) Nasopharyngeal Nasopharyngeal Swab     Status: Abnormal   Collection Time: 12/23/20  5:19 PM   Specimen: Nasopharyngeal Swab  Result Value Ref Range Status   SARS Coronavirus 2 POSITIVE (A) NEGATIVE Final    Comment: RESULT CALLED TO, READ BACK BY AND VERIFIED WITH: RN R HARDY [id number] AT 1856 BY CM (NOTE) SARS-CoV-2 target nucleic acids are DETECTED  SARS-CoV-2 RNA is generally detectable in upper respiratory specimens  during the acute phase of infection.  Positive results are indicative  of the presence of the identified virus, but do not rule out bacterial infection or co-infection with other pathogens not detected by the test.  Clinical correlation with patient history and  other diagnostic information is necessary to determine patient infection status.  The expected result is negative.  Fact Sheet for Patients:   StrictlyIdeas.no   Fact Sheet for Healthcare Providers:   BankingDealers.co.za    This test is not yet approved or cleared by the [place] FDA and  has been authorized for detection and/or diagnosis of SARS-CoV-2 by FDA under an Emergency Use Authorization (EUA).  This EUA will remain in effect (meaning this test  can be used) for the duration of  the COVID-19 declaration under Section 564(b)(1) of the Act, 21 U.S.C. section 360-bbb-3(b)(1), unless the authorization is terminated or revoked sooner.  Performed at [facility], [place] [address], [place], [place] [address]          Radiology Studies: VAS [place] LOWER EXTREMITY VENOUS (DVT)  Result Date: 12/26/2020  Lower Venous DVT Study Indications: Edema.  Comparison Study: 09/27/18 previous Performing Technologist: Abram Sander RVS  Examination Guidelines: A complete evaluation includes B-mode  imaging, spectral Doppler, color Doppler, and power Doppler as needed of all accessible portions of each vessel. Bilateral testing is considered an integral part of a complete examination. Limited examinations for reoccurring indications may be performed as noted. The reflux portion of the exam is performed with the patient in reverse Trendelenburg.  +---------+---------------+---------+-----------+----------+--------------+ RIGHT    CompressibilityPhasicitySpontaneityPropertiesThrombus Aging +---------+---------------+---------+-----------+----------+--------------+ CFV      Full           Yes      Yes                                 +---------+---------------+---------+-----------+----------+--------------+ SFJ      Full                                                        +---------+---------------+---------+-----------+----------+--------------+ FV Prox  Full                                                        +---------+---------------+---------+-----------+----------+--------------+ FV Mid   Full                                                        +---------+---------------+---------+-----------+----------+--------------+ FV DistalFull                                                        +---------+---------------+---------+-----------+----------+--------------+ PFV      Full                                                        +---------+---------------+---------+-----------+----------+--------------+  POP      Full           Yes      Yes                                 +---------+---------------+---------+-----------+----------+--------------+ PTV      Full                                                        +---------+---------------+---------+-----------+----------+--------------+ PERO     Full                                                        +---------+---------------+---------+-----------+----------+--------------+    +---------+---------------+---------+-----------+----------+--------------+ LEFT     CompressibilityPhasicitySpontaneityPropertiesThrombus Aging +---------+---------------+---------+-----------+----------+--------------+ CFV      Full           Yes      Yes                                 +---------+---------------+---------+-----------+----------+--------------+ SFJ      Full                                                        +---------+---------------+---------+-----------+----------+--------------+ FV Prox  Full                                                        +---------+---------------+---------+-----------+----------+--------------+ FV Mid   Full                                                        +---------+---------------+---------+-----------+----------+--------------+ FV DistalFull                                                        +---------+---------------+---------+-----------+----------+--------------+ PFV      Full                                                        +---------+---------------+---------+-----------+----------+--------------+ POP      Full           Yes      Yes                                 +---------+---------------+---------+-----------+----------+--------------+  PTV      Full                                                        +---------+---------------+---------+-----------+----------+--------------+ PERO     Full                                                        +---------+---------------+---------+-----------+----------+--------------+     Summary: BILATERAL: - No evidence of deep vein thrombosis seen in the lower extremities, bilaterally. - No evidence of superficial venous thrombosis in the lower extremities, bilaterally. -No evidence of popliteal cyst, bilaterally.   *See table(s) above for measurements and observations. Electronically signed by Harold Barban MD on 12/26/2020 at  9:40:35 PM.    Final         Scheduled Meds: . albuterol  1-2 puff Inhalation BID  . amLODipine  10 mg Oral Daily  . Chlorhexidine Gluconate Cloth  6 each Topical Q0600  . cholecalciferol  1,000 Units Oral Daily  . cloNIDine  0.2 mg Oral TID  . fenofibrate  160 mg Oral Daily  . folic acid  1 mg Oral Daily  . guaiFENesin  600 mg Oral BID  . heparin  2,500 Units Intravenous Once  . ipratropium  2 puff Inhalation BID  . labetalol  400 mg Oral BID  . predniSONE  50 mg Oral Daily  . vitamin B-12  100 mcg Oral Daily   Continuous Infusions: . furosemide 160 mg (12/27/20 0806)  . heparin    . nitroGLYCERIN Stopped (12/24/20 1900)     LOS: 4 days    Time spent: 35 minutes.     Elmarie Shiley, MD [facility]   If 7PM-7AM, please contact night-coverage [email or web address]  12/27/2020, 2:40 PM

## 2020-12-27 NOTE — Progress Notes (Signed)
ANTICOAGULATION CONSULT NOTE   Pharmacy Consult for heparin Indication: COVID infection, elevated d-dimer  No Known Allergies  Patient Measurements: Height: '5\' 7"'$  (170.2 cm) Weight: 85 kg (187 lb 6.3 oz) IBW/kg (Calculated) : 61.6 Heparin Dosing Weight: 80kg  Vital Signs: Temp: 98.6 F (37 C) (01/27 0755) Temp Source: Axillary (01/27 0755) BP: 166/113 (01/27 0755) Pulse Rate: 61 (01/27 0755)  Labs: Recent Labs    12/24/20 1520 12/25/20 0118 12/25/20 0118 12/25/20 0612 12/26/20 0224 12/27/20 0246  HGB  --  7.6*   < >  --  7.6* 8.3*  HCT  --  23.6*  --   --  24.1* 24.5*  PLT  --  130*  --   --  152 172  HEPARINUNFRC 0.56  --   --  0.60  --   --   CREATININE  --  5.97*  --   --  6.97* 5.74*  TROPONINIHS  --  149*  --  126*  --   --    < > = values in this interval not displayed.    Assessment: 48 YOF presenting with SOB, COVID +, d-dimer 8.38>>3.61>>12.36.  She is not on anticoagulation PTA,  Chronic anemia stable.   Goal of Therapy:  Heparin level 0.3-0.7 units/ml Monitor platelets by anticoagulation protocol: Yes   Plan:  D/C heparin sq Heparin 2500 units bolus x 1, then heparin at 1300 units/hr F/u heparin level in 6 hours   Rumaysa Sabatino A. Levada Dy, PharmD, BCPS, [facility] Clinical Pharmacist [place] Please utilize Amion for appropriate phone number to reach the unit pharmacist ([place])   12/27/2020, 12:04 PM

## 2020-12-27 NOTE — Progress Notes (Addendum)
Patient called RN into the room because she was c/o a headache and feeling miserable.  Pt asked for nurse to stop the lasix because "mixing the meds together" was making her sick. The patient was educated that the heparin is a continuous infusion, and heparin and lasix are compatible.  RN told patient she would request for the IV team to insert a new IV if that made the patient more comfortable. Patient agreed. IV nurse came to bedside and pt stated she did not want a new IV; she wanted to wait till the heparin was done then do lasix.  Pt repeatedly asking to speak with nephrologist.  RN re-educated patient about heparin being a continuous infusion.  Pt stated she did not want both medications in her body at the same time.  RN asked are you refusing to receive lasix until you speak to the nephrologist.  Patient responded yes.  IV RN was at the bedside during conversation.  See MAR for admin notes and duration.  Charge nurse Elmyra Ricks was made aware of the situation.

## 2020-12-27 NOTE — Progress Notes (Signed)
Pt. Declined IV insertion, requested to speak to her Nurse to clarify medications and the necessity of another IV on the same arm, right arm is restricted. RN at bedside and  this IV [facility]ast Nurse witnessed the refusal of the pt. For another  IV start at this time. Stated that she wanted to wait for her nephro MD to discuss her medications ,particularly the heparin.

## 2020-12-28 ENCOUNTER — Inpatient Hospital Stay (HOSPITAL_COMMUNITY): Payer: Medicaid Other

## 2020-12-28 DIAGNOSIS — U071 COVID-19: Secondary | ICD-10-CM | POA: Diagnosis not present

## 2020-12-28 LAB — COMPREHENSIVE METABOLIC PANEL
ALT: 24 U/L (ref 0–44)
AST: 21 U/L (ref 15–41)
Albumin: 2 g/dL — ABNORMAL LOW (ref 3.5–5.0)
Alkaline Phosphatase: 79 U/L (ref 38–126)
Anion gap: 14 (ref 5–15)
BUN: 66 mg/dL — ABNORMAL HIGH (ref 6–20)
CO2: 23 mmol/L (ref 22–32)
Calcium: 6.9 mg/dL — ABNORMAL LOW (ref 8.9–10.3)
Chloride: 99 mmol/L (ref 98–111)
Creatinine, Ser: 6.1 mg/dL — ABNORMAL HIGH (ref 0.44–1.00)
GFR, Estimated: 8 mL/min — ABNORMAL LOW (ref >60)
Glucose, Bld: 130 mg/dL — ABNORMAL HIGH (ref 70–99)
Potassium: 4.3 mmol/L (ref 3.5–5.1)
Sodium: 136 mmol/L (ref 135–145)
Total Bilirubin: 0.5 mg/dL (ref 0.3–1.2)
Total Protein: 4.8 g/dL — ABNORMAL LOW (ref 6.5–8.1)

## 2020-12-28 LAB — CBC WITH DIFFERENTIAL/PLATELET
Abs Immature Granulocytes: 0.33 10*3/uL — ABNORMAL HIGH (ref 0.00–0.07)
Basophils Absolute: 0 10*3/uL (ref 0.0–0.1)
Basophils Relative: 0 %
Eosinophils Absolute: 0 10*3/uL (ref 0.0–0.5)
Eosinophils Relative: 0 %
HCT: 25.1 % — ABNORMAL LOW (ref 36.0–46.0)
Hemoglobin: 8.1 g/dL — ABNORMAL LOW (ref 12.0–15.0)
Immature Granulocytes: 4 %
Lymphocytes Relative: 16 %
Lymphs Abs: 1.3 10*3/uL (ref 0.7–4.0)
MCH: 29.5 pg (ref 26.0–34.0)
MCHC: 32.3 g/dL (ref 30.0–36.0)
MCV: 91.3 fL (ref 80.0–100.0)
Monocytes Absolute: 0.7 10*3/uL (ref 0.1–1.0)
Monocytes Relative: 8 %
Neutro Abs: 6 10*3/uL (ref 1.7–7.7)
Neutrophils Relative %: 72 %
Platelets: 210 10*3/uL (ref 150–400)
RBC: 2.75 MIL/uL — ABNORMAL LOW (ref 3.87–5.11)
RDW: 14.1 % (ref 11.5–15.5)
WBC: 8.3 10*3/uL (ref 4.0–10.5)
nRBC: 0.2 % (ref 0.0–0.2)

## 2020-12-28 LAB — C-REACTIVE PROTEIN: CRP: 2.1 mg/dL — ABNORMAL HIGH (ref <1.0)

## 2020-12-28 LAB — HEPARIN LEVEL (UNFRACTIONATED): Heparin Unfractionated: 0.62 IU/mL (ref 0.30–0.70)

## 2020-12-28 LAB — MAGNESIUM: Magnesium: 1.9 mg/dL (ref 1.7–2.4)

## 2020-12-28 LAB — FERRITIN: Ferritin: 918 ng/mL — ABNORMAL HIGH (ref 11–307)

## 2020-12-28 LAB — D-DIMER, QUANTITATIVE: D-Dimer, Quant: 13.27 ug/mL-FEU — ABNORMAL HIGH (ref 0.00–0.50)

## 2020-12-28 MED ORDER — TECHNETIUM TO 99M ALBUMIN AGGREGATED
4.9000 | Freq: Once | INTRAVENOUS | Status: AC | PRN
  Administered 2020-12-28: 4.4 via INTRAVENOUS

## 2020-12-28 NOTE — Progress Notes (Signed)
[facility] Progress Note  Subjective: on 2 L [place] O2, 99% sats. No c/o today.   Admit history: 40 y.o. 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  In review of the records, pt presented to HD on 1/15 and then on 1/21. Then came to ER visibly SOB -  CXR showing pulm edema but also COVID positive - we were asked to provide emergent HD due to her O2 req and malignant HTN. We were not able to provide HD last night and she is on for this morning.   Vitals:   12/28/20 0510 12/28/20 0727 12/28/20 0924 12/28/20 1227  BP:  (!) 163/113 (!) 157/104   Pulse:  60    Resp:  19    Temp:  98.9 F (37.2 C)  99 F (37.2 C)  TempSrc:  Oral  Oral  SpO2: 100% 100%    Weight:      Height:        Exam:   alert, nad   no jvd  Chest CTA bilat  Cor reg no RG  Abd soft ntnd no ascites   Ext bilat 2+ pitting LE edema   Alert, NF, ox3    RIJ TDC placed 12/31 by IR     OP HD: TTS [place]  , AKI designation  4h  85kg  3K/2.5 bath  Hep none TDC     Assessment/ Plan: 1. Acute hypoxic resp failure - 2/2 pulm edema and/or COVID pna. Got HD here on 1/24 and 1/26, 5.4 L UF in total. Still sig amt of leg edema. Plan next HD tomorrow to get back on schedule.  2. HTN urgency - lower vol w/ HD, give home meds 3. Vol overload - sig leg edema bilat, strict fluid restriction explained to pt 4. ESRD - AKI designation, see below. Recent start 1-2 wks ago. As above.  5. 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 AKI designation. Getting po pred 40/ d starting 1/07 and IV cytoxan 500 mg q 2 wks (1st dose 1/07) x 6  doses. Overdue for 2nd dose cytoxan but per Dr Hollie Salk we are holding off until off of COVID isolation. No sign of recovery yet w/ creat 7- 9 range.  7. Bipolar d/o  Rob Lisett Dirusso 12/28/2020, 3:57 PM   Recent Labs  Lab 12/27/20 0246 12/28/20 0233  K 4.5 4.3  BUN 50* 66*  CREATININE 5.74* 6.10*  CALCIUM 7.0* 6.9*  HGB 8.3* 8.1*   Inpatient medications: . albuterol  1-2 puff Inhalation BID  . amLODipine  10 mg Oral Daily  . Chlorhexidine Gluconate Cloth  6 each Topical Q0600  . cholecalciferol  1,000 Units Oral Daily  . cloNIDine  0.2 mg Oral TID  . fenofibrate  160 mg Oral Daily  . folic acid  1 mg Oral Daily  . guaiFENesin  600 mg Oral BID  . ipratropium  2 puff Inhalation BID  . labetalol  400 mg Oral BID  . predniSONE  50 mg Oral Daily  . vitamin B-12  100 mcg Oral Daily   .  furosemide 160 mg (12/28/20 0927)  . heparin 1,200 Units/hr (12/28/20 0759)  . nitroGLYCERIN Stopped (12/24/20 1900)   acetaminophen, ipratropium, LORazepam, ondansetron **OR** ondansetron (ZOFRAN) IV, oxyCODONE, polyethylene glycol

## 2020-12-28 NOTE — Progress Notes (Signed)
[employer] for heparin Indication: COVID infection, elevated d-dimer  No Known Allergies  Patient Measurements: Height: '5\' 7"'$  (170.2 cm) Weight: 85 kg (187 lb 6.3 oz) IBW/kg (Calculated) : 61.6 Heparin Dosing Weight: 80kg  Vital Signs: Temp: 98.9 F (37.2 C) (01/28 0727) Temp Source: Oral (01/28 0727) BP: 163/113 (01/28 0727) Pulse Rate: 60 (01/28 0727)  Labs: Recent Labs    12/26/20 0224 12/27/20 0246 12/27/20 1853 12/28/20 0233  HGB 7.6* 8.3*  --  8.1*  HCT 24.1* 24.5*  --  25.1*  PLT 152 172  --  210  HEPARINUNFRC  --   --  0.73* 0.62  CREATININE 6.97* 5.74*  --  6.10*    Assessment: 31 YOF presenting with SOB, COVID +, d-dimer 8.38>>3.61>>12.36.  She is not on anticoagulation PTA,  Chronic anemia stable.  Heparin level with AM labs is 0.62. DDimer is increased to 13.27   Goal of Therapy:  Heparin level 0.3-0.7 units/ml Monitor platelets by anticoagulation protocol: Yes   Plan:   Continue heparin at 1200 units/hr F/u daily heparin level   Eugen Jeansonne A. Levada Dy, PharmD, BCPS, [facility] Clinical Pharmacist [place] Please utilize Amion for appropriate phone number to reach the unit pharmacist ([place])   12/28/2020, 7:50 AM

## 2020-12-28 NOTE — Progress Notes (Signed)
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  Patient presented with shortness of breath that started 2 days prior to admission became acutely worse the day of admission.  Patient has been newly diagnosed with lupus nephritis and is started on dialysis.  She have to be able to take her home medications because she could not afford them.  She received a dose of Cytoxan 1/8. Arrival she was hypoxic initially requiring nonrebreather mask subsequently transition to nasal cannula.  BNP 4500, mild elevated troponin, chest x-ray consistent with fluid overload bibasilar atelectasis.  COVID-19 PCR came back positive.     Assessment & Plan:   Principal Problem:   COVID-19 virus infection Active Problems:   Flash pulmonary edema (HCC)   Hypertensive emergency   CHF exacerbation (HCC)   ESRD (end stage renal disease) ([place])   Elevated troponin   Respiratory failure (HCC)   1-Hypoxic Respiratory Failure secondary to pulmonary edema and COVID-19 pneumonia: Completed Remdesivir and IV steroids 4 days. Now on oral prednisone.  Continue with oxygen supplementation Hemodialysis to treat pulmonary edema continue with IV Lasix D dimer increasing 12--13. Continue with heparin gtt. Plan to repeat VQ scan.   2-Acute diastolic heart failure exacerbation Continue with IV Lasix. Continue with hemodialysis  3-Hypertensive emergency: Continue with Norvasc, clonidine, labetalol Elevated troponin: Negative chest pain Continue with lasix.   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   Elevated  D-dimer, VQ scan negative: Discontinue heparin drip 1/25. Doppler; negative for DVT 1/26. D dimer increase to 12--13 -back on  heparin gtt.    Estimated body mass index is 29.35 kg/m as calculated from the following:   Height as of this encounter: '5\' 7"'$  (1.702 m).   Weight as of this encounter: 85 kg.   DVT prophylaxis: Heparin  Code Status: Full code Family Communication: Care discussed with patient Disposition Plan:  Status is: Inpatient  Remains inpatient appropriate because:IV treatments appropriate due to intensity of illness or inability to take PO   Dispo: The patient is from: Home              Anticipated d/c is to: Home              Anticipated d/c date is: 3 days              Patient currently is not medically stable to d/c.   Difficult to place patient No        Consultants:   Nephrology  Procedures:   None  Antimicrobials:    Subjective: she is feeling better, report improvement of dyspnea. Report mild chest pain.   Objective: Vitals:   12/28/20 0510 12/28/20 0727 12/28/20 0924 12/28/20 1227  BP:  (!) 163/113 (!) 157/104   Pulse:  60    Resp:  19    Temp:  98.9 F (37.2 C)  99 F (37.2 C)  TempSrc:  Oral  Oral  SpO2: 100% 100%    Weight:      Height:        Intake/Output Summary (Last 24 hours) at 12/28/2020 1553 Last data filed at 12/28/2020 0844 Gross per 24 hour  Intake 574.96 ml  Output --  Net 574.96 ml   Filed Weights   12/24/20 2300 12/26/20 1337 12/26/20 1615  Weight: 84.4 kg 88.4 kg 85 kg    Examination:  General exam: NAD Respiratory system; CTA Cardiovascular system: S 1, S 2 RRR Gastrointestinal system: BS present, soft, nt Central nervous system; Non focal.  Extremities: plus 2 edema Skin: No rashes.   Data Reviewed: I have personally reviewed following labs and imaging studies  CBC: Recent Labs  Lab 12/24/20 0530 12/25/20 0118 12/26/20 0224 12/27/20 0246 12/28/20 0233  WBC 4.5 5.5 6.1 6.7 8.3   NEUTROABS 3.3 4.6 4.8 5.2 6.0  HGB 7.4* 7.6* 7.6* 8.3* 8.1*  HCT 22.6* 23.6* 24.1* 24.5* 25.1*  MCV 90.8 91.5 92.7 89.7 91.3  PLT 86* 130* 152 172 [id number]   Basic Metabolic Panel: Recent Labs  Lab 12/24/20 0530 12/25/20 0118 12/26/20 0224 12/27/20 0246 12/28/20 0233  NA 138 135 137 137 136  K 4.6 4.5 4.9 4.5 4.3  CL 99 98 100 100 99  CO2 '25 24 24 24 23  '$ GLUCOSE 139* 169* 132* 149* 130*  BUN 57* 42* 58* 50* 66*  CREATININE 7.58* 5.97* 6.97* 5.74* 6.10*  CALCIUM 6.6* 6.8* 6.9* 7.0* 6.9*  MG 1.8 1.9 1.9 1.8 1.9   GFR: Estimated Creatinine Clearance: 14.2 mL/min (A) (by C-G formula based on SCr of 6.1 mg/dL (H)). Liver Function Tests: Recent Labs  Lab 12/24/20 0530 12/25/20 0118 12/26/20 0224 12/27/20 0246 12/28/20 0233  AST '21 21 17 23 21  '$ ALT '23 22 19 23 24  '$ ALKPHOS 95 99 92 91 79  BILITOT 0.8 0.5 0.6 0.5 0.5  PROT 4.7* 4.8* 5.0* 5.2* 4.8*  ALBUMIN 1.9* 2.0* 2.0* 2.0* 2.0*   No results for input(s): LIPASE, AMYLASE in the last 168 hours. No results for input(s): AMMONIA in the last 168 hours. Coagulation Profile: No results for input(s): INR, PROTIME in the last 168 hours. Cardiac Enzymes: No results for input(s): CKTOTAL, CKMB, CKMBINDEX, TROPONINI in the last 168 hours. BNP (last 3 results) No results for input(s): PROBNP in the last 8760 hours. HbA1C: No results for input(s): HGBA1C in the last 72 hours. CBG: Recent Labs  Lab 12/26/20 1738  GLUCAP 186*   Lipid Profile: No results for input(s): CHOL, HDL, LDLCALC, TRIG, CHOLHDL, LDLDIRECT in the last 72 hours. Thyroid Function Tests: No results for input(s): TSH, T4TOTAL, FREET4, T3FREE, THYROIDAB in the last 72 hours. Anemia Panel: Recent Labs    12/27/20 0246 12/28/20 0233  FERRITIN 970* 918*   Sepsis Labs: Recent Labs  Lab 12/23/20 2240  PROCALCITON 4.64    Recent Results (from the past 240 hour(s))  SARS Coronavirus 2 by RT PCR (hospital order, performed in [facility] hospital lab)  Nasopharyngeal Nasopharyngeal Swab     Status: Abnormal   Collection Time: 12/23/20  5:19 PM   Specimen: Nasopharyngeal Swab  Result Value Ref Range Status   SARS Coronavirus 2 POSITIVE (A) NEGATIVE Final    Comment: RESULT CALLED TO, READ BACK BY AND VERIFIED WITH: RN R HARDY [id number] AT 1856 BY CM (NOTE) SARS-CoV-2 target nucleic acids are DETECTED  SARS-CoV-2 RNA is generally detectable in upper respiratory specimens  during the acute phase of infection.  Positive results are indicative  of the presence of the identified virus, but do not rule out bacterial infection or co-infection with other pathogens not detected by the test.  Clinical correlation with patient history and  other diagnostic information is necessary  to determine patient infection status.  The expected result is negative.  Fact Sheet for Patients:   StrictlyIdeas.no   Fact Sheet for Healthcare Providers:   BankingDealers.co.za    This test is not yet approved or cleared by the [place] FDA and  has been authorized for detection and/or diagnosis of SARS-CoV-2 by FDA under an Emergency Use Authorization (EUA).  This EUA will remain in effect (meaning this test  can be used) for the duration of  the COVID-19 declaration under Section 564(b)(1) of the Act, 21 U.S.C. section 360-bbb-3(b)(1), unless the authorization is terminated or revoked sooner.  Performed at [facility], [place] [address], [place], [place] [address]          Radiology Studies: NM Pulmonary Perfusion  Result Date: 12/28/2020 CLINICAL DATA:  Shortness of breath.  COVID-19 positive EXAM: NUCLEAR MEDICINE PERFUSION LUNG SCAN TECHNIQUE: Perfusion images were obtained in multiple projections after intravenous injection of radiopharmaceutical. Views: Anterior, posterior, left lateral, right lateral, RPO, LPO, RAO, LAO. RADIOPHARMACEUTICALS:  4.4 mCi Tc-8mMAA IV COMPARISON:  Chest radiograph  December 28, 2020. Perfusion lung scan December 24, 2020 FINDINGS: There is again noted relative decreased uptake in portions of the left lower lobe due to combination of cardiomegaly, consolidation, and pleural effusion. Appearance in the left lower lobe is stable compared to the previous study. Elsewhere, radiotracer uptake is normal. IMPRESSION: Stable appearance compared to lung scan from 4 days prior. Decreased uptake in portions of the left lower lobe ir stable, felt to be due to combination of infiltrate, pleural effusion cardiomegaly. Elsewhere, the distribution of radiotracer uptake is normal. This appearance is not felt to be indicative of pulmonary embolus based on perfusion only imaging. Electronically Signed   By: WLowella GripIII M.D.   On: 12/28/2020 15:37   DG CHEST PORT 1 VIEW  Result Date: 12/28/2020 CLINICAL DATA:  Chest pain.  COVID positive. EXAM: PORTABLE CHEST 1 VIEW COMPARISON:  Radiographs 12/23/2020 and 11/29/2020. FINDINGS: 0936 hours. Right IJ central venous catheter is unchanged at the level of the superior right atrium. The heart size and mediastinal contours are stable. Significant interval improvement in the pulmonary aeration with residual patchy airspace opacities at both lung bases and small bilateral pleural effusions. No definite residual edema or pneumothorax. The bones appear unremarkable. IMPRESSION: Significant interval improvement in pulmonary aeration with probable residual bibasilar atelectasis and small bilateral pleural effusions. Electronically Signed   By: WRichardean SaleM.D.   On: 12/28/2020 09:49        Scheduled Meds: . albuterol  1-2 puff Inhalation BID  . amLODipine  10 mg Oral Daily  . Chlorhexidine Gluconate Cloth  6 each Topical Q0600  . cholecalciferol  1,000 Units Oral Daily  . cloNIDine  0.2 mg Oral TID  . fenofibrate  160 mg Oral Daily  . folic acid  1 mg Oral Daily  . guaiFENesin  600 mg Oral BID  . ipratropium  2 puff Inhalation  BID  . labetalol  400 mg Oral BID  . predniSONE  50 mg Oral Daily  . vitamin B-12  100 mcg Oral Daily   Continuous Infusions: . furosemide 160 mg (12/28/20 0927)  . heparin 1,200 Units/hr (12/28/20 0759)  . nitroGLYCERIN Stopped (12/24/20 1900)     LOS: 5 days    Time spent: 35 minutes.     BElmarie Shiley MD [facility]   If 7PM-7AM, please contact night-coverage [email or web address]  12/28/2020, 3:53 PM

## 2020-12-29 DIAGNOSIS — U071 COVID-19: Secondary | ICD-10-CM | POA: Diagnosis not present

## 2020-12-29 LAB — RENAL FUNCTION PANEL
Albumin: 2 g/dL — ABNORMAL LOW (ref 3.5–5.0)
Anion gap: 13 (ref 5–15)
BUN: 80 mg/dL — ABNORMAL HIGH (ref 6–20)
CO2: 22 mmol/L (ref 22–32)
Calcium: 7.1 mg/dL — ABNORMAL LOW (ref 8.9–10.3)
Chloride: 100 mmol/L (ref 98–111)
Creatinine, Ser: 6.48 mg/dL — ABNORMAL HIGH (ref 0.44–1.00)
GFR, Estimated: 8 mL/min — ABNORMAL LOW (ref >60)
Glucose, Bld: 104 mg/dL — ABNORMAL HIGH (ref 70–99)
Phosphorus: 5.4 mg/dL — ABNORMAL HIGH (ref 2.5–4.6)
Potassium: 4.3 mmol/L (ref 3.5–5.1)
Sodium: 135 mmol/L (ref 135–145)

## 2020-12-29 LAB — C-REACTIVE PROTEIN: CRP: 1.8 mg/dL — ABNORMAL HIGH (ref <1.0)

## 2020-12-29 LAB — HEPARIN LEVEL (UNFRACTIONATED): Heparin Unfractionated: 0.7 IU/mL (ref 0.30–0.70)

## 2020-12-29 LAB — D-DIMER, QUANTITATIVE: D-Dimer, Quant: 3.8 ug/mL-FEU — ABNORMAL HIGH (ref 0.00–0.50)

## 2020-12-29 MED ORDER — CLONIDINE HCL 0.2 MG PO TABS
0.3000 mg | ORAL_TABLET | Freq: Three times a day (TID) | ORAL | Status: DC
  Administered 2020-12-29 – 2020-12-31 (×6): 0.3 mg via ORAL
  Filled 2020-12-29 (×7): qty 1

## 2020-12-29 NOTE — Progress Notes (Signed)
ANTICOAGULATION CONSULT NOTE   Pharmacy Consult for heparin Indication: COVID infection, elevated d-dimer  No Known Allergies  Patient Measurements: Height: '5\' 7"'$  (170.2 cm) Weight: 85 kg (187 lb 6.3 oz) IBW/kg (Calculated) : 61.6 Heparin Dosing Weight: 80kg  Vital Signs: Temp: 98.5 F (36.9 C) (01/29 0758) Temp Source: Oral (01/29 0758) BP: 170/114 (01/29 0758) Pulse Rate: 67 (01/29 0758)  Labs: Recent Labs    12/27/20 0246 12/27/20 1853 12/28/20 0233 12/29/20 0510  HGB 8.3*  --  8.1*  --   HCT 24.5*  --  25.1*  --   PLT 172  --  210  --   HEPARINUNFRC  --  0.73* 0.62 0.70  CREATININE 5.74*  --  6.10*  --     Assessment: 65 YOF presenting with SOB, COVID +, d-dimer 8.38>>3.61>>12.36.  She is not on anticoagulation PTA,  Chronic anemia stable.  Heparin level with AM labs is in the upper end of therapeutic at 0.70. D-Dimer 1/28 had increased to 13.27   Goal of Therapy:  Heparin level 0.3-0.7 units/ml Monitor platelets by anticoagulation protocol: Yes   Plan:  Decrease heparin slightly to 1150 units/hr F/u daily heparin level Monitor s/sx bleeding, daily CBC F/u long term anticoagulation plans   Wilson Singer, PharmD PGY1 Pharmacy Resident 12/29/2020 8:02 AM

## 2020-12-29 NOTE — Progress Notes (Signed)
[facility] Progress Note  Subjective: on 2 L [place] O2, 99% sats. No c/o today.   Admit history: 40 y.o. 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  In review of the records, pt presented to HD on 1/15 and then on 1/21. Then came to ER visibly SOB -  CXR showing pulm edema but also COVID positive - we were asked to provide emergent HD due to her O2 req and malignant HTN. We were not able to provide HD last night and she is on for this morning.   Vitals:   12/29/20 0415 12/29/20 0758 12/29/20 0913 12/29/20 1206  BP: (!) 169/115 (!) 170/114 (!) 157/106 (!) 146/105  Pulse: 67 67 73 73  Resp: '16 19 15 20  '$ Temp:  98.5 F (36.9 C)  98.5 F (36.9 C)  TempSrc:  Oral  Oral  SpO2: 100% 100% 94% 100%  Weight:      Height:        Exam:   alert, nad   no jvd  Chest CTA bilat  Cor reg no RG  Abd soft ntnd no ascites   Ext bilat 2+ pitting LE edema   Alert, NF, ox3    RIJ TDC placed 12/31 by IR     OP HD: TTS [place]  , AKI designation  4h  85kg  3K/2.5 bath  Hep none TDC     Assessment/ Plan: 1. Acute hypoxic resp failure - 2/2 pulm edema and/or COVID pna. Got HD here on 1/24 and 1/26, 5.4 L UF in total. Still sig amt of leg edema. Orders in for HD today.  2. HTN urgency - lower vol w/ HD, give home meds 3. Vol overload - sig leg edema bilat, strict fluid restriction explained to pt 4. ESRD - AKI designation, see below. Recent start 1-2 wks ago. As above.  5. 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 AKI designation. Getting po pred 40/ d starting 1/07 and IV cytoxan 500 mg q 2 wks  (1st dose 1/07) x 6 doses. Overdue for 2nd dose cytoxan but per Dr Hollie Salk we are holding off until pt is off of COVID isolation. No sign of recovery yet w/ creat 7- 9 range.  7. Bipolar d/o  Cathy [name] 12/29/2020, 2:06 PM   Recent Labs  Lab 12/27/20 0246 12/28/20 0233 12/29/20 0510  K 4.5 4.3 4.3  BUN 50* 66* 80*  CREATININE 5.74* 6.10* 6.48*  CALCIUM 7.0* 6.9* 7.1*  PHOS  --   --  5.4*  HGB 8.3* 8.1*  --    Inpatient medications: . albuterol  1-2 puff Inhalation BID  . amLODipine  10 mg Oral Daily  . Chlorhexidine Gluconate Cloth  6 each Topical Q0600  . cholecalciferol  1,000 Units Oral Daily  . cloNIDine  0.3 mg Oral TID  . fenofibrate  160 mg Oral Daily  . folic acid  1 mg Oral Daily  . guaiFENesin  600 mg Oral BID  . ipratropium  2 puff Inhalation BID  . labetalol  400 mg Oral  BID  . predniSONE  50 mg Oral Daily  . vitamin B-12  100 mcg Oral Daily   . furosemide 160 mg (12/29/20 0922)  . heparin 1,150 Units/hr (12/29/20 [id number])  . nitroGLYCERIN Stopped (12/24/20 1900)   acetaminophen, ipratropium, LORazepam, ondansetron **OR** ondansetron (ZOFRAN) IV, oxyCODONE, polyethylene glycol

## 2020-12-29 NOTE — Plan of Care (Signed)
  Problem: Education: Goal: Knowledge of risk factors and measures for prevention of condition will improve Outcome: Progressing   Problem: Coping: Goal: Psychosocial and spiritual needs will be supported Outcome: Progressing   Problem: Respiratory: Goal: Will maintain a patent airway Outcome: Progressing Goal: Complications related to the disease process, condition or treatment will be avoided or minimized Outcome: Progressing   Problem: Clinical Measurements: Goal: Will remain free from infection Outcome: Progressing Goal: Respiratory complications will improve Outcome: Progressing Goal: Cardiovascular complication will be avoided Outcome: Progressing   Problem: Health Behavior/Discharge Planning: Goal: Ability to manage health-related needs will improve Outcome: Progressing   Problem: Clinical Measurements: Goal: Ability to maintain clinical measurements within normal limits will improve Outcome: Progressing Goal: Will remain free from infection Outcome: Progressing   Problem: Activity: Goal: Risk for activity intolerance will decrease Outcome: Progressing   Problem: Nutrition: Goal: Adequate nutrition will be maintained Outcome: Progressing   Problem: Coping: Goal: Level of anxiety will decrease Outcome: Progressing   Problem: Elimination: Goal: Will not experience complications related to bowel motility Outcome: Progressing Goal: Will not experience complications related to urinary retention Outcome: Progressing   Problem: Safety: Goal: Ability to remain free from injury will improve Outcome: Progressing   Problem: Skin Integrity: Goal: Risk for impaired skin integrity will decrease Outcome: Progressing

## 2020-12-29 NOTE — Progress Notes (Signed)
Otilio Connors, RN informed that the pt's HD tx has been moved to 12/30/2020.

## 2020-12-29 NOTE — Progress Notes (Signed)
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  Patient presented with shortness of breath that started 2 days prior to admission became acutely worse the day of admission.  Patient has been newly diagnosed with lupus nephritis and is started on dialysis.  She have to be able to take her home medications because she could not afford them.  She received a dose of Cytoxan 1/8. Arrival she was hypoxic initially requiring nonrebreather mask subsequently transition to nasal cannula.  BNP 4500, mild elevated troponin, chest x-ray consistent with fluid overload bibasilar atelectasis.  COVID-19 PCR came back positive.     Assessment & Plan:   Principal Problem:   COVID-19 virus infection Active Problems:   Flash pulmonary edema (HCC)   Hypertensive emergency   CHF exacerbation (HCC)   ESRD (end stage renal disease) ([place])   Elevated troponin   Respiratory failure (HCC)   1-Hypoxic Respiratory Failure secondary to pulmonary edema and COVID-19 pneumonia: Completed Remdesivir and IV steroids 4 days. Now on oral prednisone.  Continue with oxygen supplementation Hemodialysis to treat pulmonary edema continue with IV Lasix D dimer increasing 12--13. Continue with heparin gtt. Repeated VQ scan negative.  D dimer trending down today at 3.   2-Acute diastolic heart failure exacerbation Continue with IV Lasix. Continue with hemodialysis  3-Hypertensive emergency: Continue with Norvasc, clonidine, labetalol Elevated troponin: Negative chest pain Continue with lasix.  Increase clonidine dose.   ESRD on hemodialysis secondary to lupus nephritis: Nephrology following HD today.,   /Bipolar depression: Continue with Ativan as needed Anemia,  anemia of chronic disease: Continue to monitor hemoglobin Hb stable.   Elevated D-dimer, VQ scan negative: Discontinue heparin drip 1/25. Doppler; negative for DVT 1/26. D dimer increase to 12--13 -back on  heparin gtt.  D dimer down to 3.. if stable for two days will transition soon to DVT prophylaxis dose.   Estimated body mass index is 29.35 kg/m as calculated from the following:   Height as of this encounter: '5\' 7"'$  (1.702 m).   Weight as of this encounter: 85 kg.   DVT prophylaxis: Heparin  Code Status: Full code Family Communication: Care discussed with patient Disposition Plan:  Status is: Inpatient  Remains inpatient appropriate because:IV treatments appropriate due to intensity of illness or inability to take PO   Dispo: The patient is from: Home              Anticipated d/c is to: Home              Anticipated d/c date is: 2 days              Patient currently is not medically stable to d/c. hopefully home on Monday    Difficult to place patient No        Consultants:   Nephrology  Procedures:   None  Antimicrobials:    Subjective: She is feeling better, denies dyspnea.   Objective: Vitals:   12/29/20 0415 12/29/20 0758 12/29/20 0913 12/29/20 1206  BP: (!) 169/115 (!) 170/114 (!) 157/106 (!) 146/105  Pulse: 67 67 73 73  Resp: '16 19 15 20  '$ Temp:  98.5 F (36.9 C)  98.5 F (36.9 C)  TempSrc:  Oral  Oral  SpO2: 100% 100% 94% 100%  Weight:      Height:        Intake/Output Summary (Last 24 hours) at 12/29/2020 1600 Last data filed at 12/29/2020 1203 Gross per 24 hour  Intake 651.68 ml  Output 700 ml  Net -48.32 ml   Filed Weights   12/24/20 2300 12/26/20 1337 12/26/20 1615  Weight: 84.4 kg 88.4 kg 85 kg    Examination:  General exam: NAD Respiratory system; CTA Cardiovascular system: S 1, S 2 RRR Gastrointestinal system:  BS present, soft, nt Central nervous system; Non focal.  Extremities: Plus edema Skin: No rashes.   Data  Reviewed: I have personally reviewed following labs and imaging studies  CBC: Recent Labs  Lab 12/24/20 0530 12/25/20 0118 12/26/20 0224 12/27/20 0246 12/28/20 0233  WBC 4.5 5.5 6.1 6.7 8.3  NEUTROABS 3.3 4.6 4.8 5.2 6.0  HGB 7.4* 7.6* 7.6* 8.3* 8.1*  HCT 22.6* 23.6* 24.1* 24.5* 25.1*  MCV 90.8 91.5 92.7 89.7 91.3  PLT 86* 130* 152 172 [id number]   Basic Metabolic Panel: Recent Labs  Lab 12/24/20 0530 12/25/20 0118 12/26/20 0224 12/27/20 0246 12/28/20 0233 12/29/20 0510  NA 138 135 137 137 136 135  K 4.6 4.5 4.9 4.5 4.3 4.3  CL 99 98 100 100 99 100  CO2 '25 24 24 24 23 22  '$ GLUCOSE 139* 169* 132* 149* 130* 104*  BUN 57* 42* 58* 50* 66* 80*  CREATININE 7.58* 5.97* 6.97* 5.74* 6.10* 6.48*  CALCIUM 6.6* 6.8* 6.9* 7.0* 6.9* 7.1*  MG 1.8 1.9 1.9 1.8 1.9  --   PHOS  --   --   --   --   --  5.4*   GFR: Estimated Creatinine Clearance: 13.3 mL/min (A) (by C-G formula based on SCr of 6.48 mg/dL (H)). Liver Function Tests: Recent Labs  Lab 12/24/20 0530 12/25/20 0118 12/26/20 0224 12/27/20 0246 12/28/20 0233 12/29/20 0510  AST '21 21 17 23 21  '$ --   ALT '23 22 19 23 24  '$ --   ALKPHOS 95 99 92 91 79  --   BILITOT 0.8 0.5 0.6 0.5 0.5  --   PROT 4.7* 4.8* 5.0* 5.2* 4.8*  --   ALBUMIN 1.9* 2.0* 2.0* 2.0* 2.0* 2.0*   No results for input(s): LIPASE, AMYLASE in the last 168 hours. No results for input(s): AMMONIA in the last 168 hours. Coagulation Profile: No results for input(s): INR, PROTIME in the last 168 hours. Cardiac Enzymes: No results for input(s): CKTOTAL, CKMB, CKMBINDEX, TROPONINI in the last 168 hours. BNP (last 3 results) No results for input(s): PROBNP in the last 8760 hours. HbA1C: No results for input(s): HGBA1C in the last 72 hours. CBG: Recent Labs  Lab 12/26/20 1738  GLUCAP 186*   Lipid Profile: No results for input(s): CHOL, HDL, LDLCALC, TRIG, CHOLHDL, LDLDIRECT in the last 72 hours. Thyroid Function Tests: No results for input(s): TSH, T4TOTAL,  FREET4, T3FREE, THYROIDAB in the last 72 hours. Anemia Panel: Recent Labs    12/27/20 0246 12/28/20 0233  FERRITIN 970* 918*   Sepsis Labs: Recent Labs  Lab 12/23/20 2240  PROCALCITON 4.64    Recent Results (from the past 240 hour(s))  SARS Coronavirus 2 by RT PCR (hospital order, performed in [facility] hospital lab) Nasopharyngeal Nasopharyngeal Swab     Status: Abnormal   Collection Time: 12/23/20  5:19 PM   Specimen: Nasopharyngeal Swab  Result Value Ref Range Status   SARS Coronavirus 2 POSITIVE (A) NEGATIVE Final    Comment: RESULT  CALLED TO, READ BACK BY AND VERIFIED WITH: RN R HARDY [phone] AT 1856 BY CM (NOTE) SARS-CoV-2 target nucleic acids are DETECTED  SARS-CoV-2 RNA is generally detectable in upper respiratory specimens  during the acute phase of infection.  Positive results are indicative  of the presence of the identified virus, but do not rule out bacterial infection or co-infection with other pathogens not detected by the test.  Clinical correlation with patient history and  other diagnostic information is necessary to determine patient infection status.  The expected result is negative.  Fact Sheet for Patients:   StrictlyIdeas.no   Fact Sheet for Healthcare Providers:   BankingDealers.co.za    This test is not yet approved or cleared by the [place] FDA and  has been authorized for detection and/or diagnosis of SARS-CoV-2 by FDA under an Emergency Use Authorization (EUA).  This EUA will remain in effect (meaning this test  can be used) for the duration of  the COVID-19 declaration under Section 564(b)(1) of the Act, 21 U.S.C. section 360-bbb-3(b)(1), unless the authorization is terminated or revoked sooner.  Performed at [facility], [place] [address], [place], [place] [address]          Radiology Studies: NM Pulmonary Perfusion  Result Date: 12/28/2020 CLINICAL DATA:  Shortness of  breath.  COVID-19 positive EXAM: NUCLEAR MEDICINE PERFUSION LUNG SCAN TECHNIQUE: Perfusion images were obtained in multiple projections after intravenous injection of radiopharmaceutical. Views: Anterior, posterior, left lateral, right lateral, RPO, LPO, RAO, LAO. RADIOPHARMACEUTICALS:  4.4 mCi Tc-75mMAA IV COMPARISON:  Chest radiograph December 28, 2020. Perfusion lung scan December 24, 2020 FINDINGS: There is again noted relative decreased uptake in portions of the left lower lobe due to combination of cardiomegaly, consolidation, and pleural effusion. Appearance in the left lower lobe is stable compared to the previous study. Elsewhere, radiotracer uptake is normal. IMPRESSION: Stable appearance compared to lung scan from 4 days prior. Decreased uptake in portions of the left lower lobe ir stable, felt to be due to combination of infiltrate, pleural effusion cardiomegaly. Elsewhere, the distribution of radiotracer uptake is normal. This appearance is not felt to be indicative of pulmonary embolus based on perfusion only imaging. Electronically Signed   By: WLowella GripIII M.D.   On: 12/28/2020 15:37   DG CHEST PORT 1 VIEW  Result Date: 12/28/2020 CLINICAL DATA:  Chest pain.  COVID positive. EXAM: PORTABLE CHEST 1 VIEW COMPARISON:  Radiographs 12/23/2020 and 11/29/2020. FINDINGS: 0936 hours. Right IJ central venous catheter is unchanged at the level of the superior right atrium. The heart size and mediastinal contours are stable. Significant interval improvement in the pulmonary aeration with residual patchy airspace opacities at both lung bases and small bilateral pleural effusions. No definite residual edema or pneumothorax. The bones appear unremarkable. IMPRESSION: Significant interval improvement in pulmonary aeration with probable residual bibasilar atelectasis and small bilateral pleural effusions. Electronically Signed   By: WRichardean SaleM.D.   On: 12/28/2020 09:49        Scheduled  Meds: . albuterol  1-2 puff Inhalation BID  . amLODipine  10 mg Oral Daily  . Chlorhexidine Gluconate Cloth  6 each Topical Q0600  . cholecalciferol  1,000 Units Oral Daily  . cloNIDine  0.3 mg Oral TID  . fenofibrate  160 mg Oral Daily  . folic acid  1 mg Oral Daily  . guaiFENesin  600 mg Oral BID  . ipratropium  2 puff Inhalation BID  . labetalol  400  mg Oral BID  . predniSONE  50 mg Oral Daily  . vitamin B-12  100 mcg Oral Daily   Continuous Infusions: . furosemide 160 mg (12/29/20 1544)  . heparin 1,150 Units/hr (12/29/20 [id number])  . nitroGLYCERIN Stopped (12/24/20 1900)     LOS: 6 days    Time spent: 35 minutes.     Elmarie Shiley, MD [facility]   If 7PM-7AM, please contact night-coverage [email or web address]  12/29/2020, 4:00 PM

## 2020-12-30 DIAGNOSIS — U071 COVID-19: Secondary | ICD-10-CM | POA: Diagnosis not present

## 2020-12-30 LAB — CBC
HCT: 23.1 % — ABNORMAL LOW (ref 36.0–46.0)
Hemoglobin: 7.8 g/dL — ABNORMAL LOW (ref 12.0–15.0)
MCH: 29.9 pg (ref 26.0–34.0)
MCHC: 33.8 g/dL (ref 30.0–36.0)
MCV: 88.5 fL (ref 80.0–100.0)
Platelets: 215 10*3/uL (ref 150–400)
RBC: 2.61 MIL/uL — ABNORMAL LOW (ref 3.87–5.11)
RDW: 14.8 % (ref 11.5–15.5)
WBC: 10 10*3/uL (ref 4.0–10.5)
nRBC: 0 % (ref 0.0–0.2)

## 2020-12-30 LAB — RENAL FUNCTION PANEL
Albumin: 2 g/dL — ABNORMAL LOW (ref 3.5–5.0)
Anion gap: 14 (ref 5–15)
BUN: 89 mg/dL — ABNORMAL HIGH (ref 6–20)
CO2: 21 mmol/L — ABNORMAL LOW (ref 22–32)
Calcium: 7.2 mg/dL — ABNORMAL LOW (ref 8.9–10.3)
Chloride: 100 mmol/L (ref 98–111)
Creatinine, Ser: 6.71 mg/dL — ABNORMAL HIGH (ref 0.44–1.00)
GFR, Estimated: 8 mL/min — ABNORMAL LOW (ref >60)
Glucose, Bld: 112 mg/dL — ABNORMAL HIGH (ref 70–99)
Phosphorus: 5.6 mg/dL — ABNORMAL HIGH (ref 2.5–4.6)
Potassium: 4.5 mmol/L (ref 3.5–5.1)
Sodium: 135 mmol/L (ref 135–145)

## 2020-12-30 LAB — D-DIMER, QUANTITATIVE: D-Dimer, Quant: 3.37 ug/mL-FEU — ABNORMAL HIGH (ref 0.00–0.50)

## 2020-12-30 LAB — HEPARIN LEVEL (UNFRACTIONATED): Heparin Unfractionated: 0.44 IU/mL (ref 0.30–0.70)

## 2020-12-30 LAB — C-REACTIVE PROTEIN: CRP: 3.4 mg/dL — ABNORMAL HIGH (ref <1.0)

## 2020-12-30 NOTE — Progress Notes (Signed)
[facility] Progress Note  Subjective: seen in room, c/o abd swelling and "pushing up on my chest". No sig SOB, on RA.   Admit history: 40 y.o. 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  In review of the records, pt presented to HD on 1/15 and then on 1/21. Then came to ER visibly SOB -  CXR showing pulm edema but also COVID positive - we were asked to provide emergent HD due to her O2 req and malignant HTN. We were not able to provide HD last night and she is on for this morning.   Vitals:   12/30/20 0733 12/30/20 0800 12/30/20 1202 12/30/20 1600  BP: (!) 169/115  (!) 163/113 (!) 161/82  Pulse: 63  70 67  Resp: '19  19 18  '$ Temp: 99 F (37.2 C)  99 F (37.2 C) 99 F (37.2 C)  TempSrc: Oral  Oral Oral  SpO2: 100% 100% 100% 100%  Weight:      Height:        Exam:   alert, nad , comfortable  no jvd  Chest CTA bilat  Cor reg no RG  Abd soft ntnd no ascites   Ext bilat 2+ pitting LE edema, down a bit   Alert, NF, ox3    RIJ TDC placed 12/31 by IR     OP HD: TTS [place]  , AKI designation  4h  85kg  3K/2.5 bath  Hep none TDC     Assessment/ Plan: 1. Acute hypoxic resp failure - 2/2 pulm edema and/or COVID pna. Got HD here on 1/24 and 1/26, 5.4 L UF in total. Still sig amt of leg edema.  2. HTN urgency - lower vol w/ HD, give home meds 3. Vol overload - sig leg edema bilat, strict fluid restriction explained to pt 4. ESRD - AKI designation, see below. Recent start 1-2 wks ago. Had HD here on 1/24 and 1/26, overdue. Bumped from Sat > Sun to Monday. Will prioritize for HD tomorrow.  5. COVID infection - getting usual meds per pmd 6. 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 AKI designation. Getting po pred 40/ d starting 1/07 and IV cytoxan 500 mg q 2 wks (1st dose 1/07) x 6 doses. Overdue for 2nd dose cytoxan but per Dr Hollie Salk we are holding off until pt is off of COVID isolation. No sign of recovery yet w/ creat 7- 9 range.  7. Bipolar d/o  Cathy [name] 12/30/2020, 8:53 PM   Recent Labs  Lab 12/28/20 0233 12/29/20 0510 12/30/20 0350  K 4.3 4.3 4.5  BUN 66* 80* 89*  CREATININE 6.10* 6.48* 6.71*  CALCIUM 6.9* 7.1* 7.2*  PHOS  --  5.4* 5.6*  HGB 8.1*  --  7.8*   Inpatient medications: . albuterol  1-2 puff Inhalation BID  . amLODipine  10 mg Oral Daily  . Chlorhexidine Gluconate Cloth  6 each Topical Q0600  . cholecalciferol  1,000 Units Oral Daily  . cloNIDine  0.3 mg Oral TID  . fenofibrate  160 mg Oral Daily  . folic acid  1 mg Oral  Daily  . guaiFENesin  600 mg Oral BID  . ipratropium  2 puff Inhalation BID  . labetalol  400 mg Oral BID  . predniSONE  50 mg Oral Daily  . vitamin B-12  100 mcg Oral Daily   . furosemide 160 mg (12/30/20 1732)  . heparin 1,150 Units/hr (12/29/20 2235)  . nitroGLYCERIN Stopped (12/24/20 1900)   acetaminophen, ipratropium, LORazepam, ondansetron **OR** ondansetron (ZOFRAN) IV, oxyCODONE, polyethylene glycol

## 2020-12-30 NOTE — Progress Notes (Signed)
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  Patient presented with shortness of breath that started 2 days prior to admission became acutely worse the day of admission.  Patient has been newly diagnosed with lupus nephritis and is started on dialysis.  She have to be able to take her home medications because she could not afford them.  She received a dose of Cytoxan 1/8. Arrival she was hypoxic initially requiring nonrebreather mask subsequently transition to nasal cannula.  BNP 4500, mild elevated troponin, chest x-ray consistent with fluid overload bibasilar atelectasis.  COVID-19 PCR came back positive.     Assessment & Plan:   Principal Problem:   COVID-19 virus infection Active Problems:   Flash pulmonary edema (HCC)   Hypertensive emergency   CHF exacerbation (HCC)   ESRD (end stage renal disease) ([place])   Elevated troponin   Respiratory failure (HCC)   1-Hypoxic Respiratory Failure secondary to pulmonary edema and COVID-19 pneumonia: Completed Remdesivir and IV steroids 4 days. Now on oral prednisone.  Continue with oxygen supplementation Hemodialysis to treat pulmonary edema continue with IV Lasix D dimer increasing 12--13. Continue with heparin gtt. Repeated VQ scan negative.  D dimer trending down today at 3. Stable today. If D dimer stable by tomorrow will discontinue heparin Gtt   2-Acute diastolic heart failure exacerbation Continue with IV Lasix. Continue with hemodialysis  3-Hypertensive emergency: Continue with Norvasc, clonidine, labetalol Elevated troponin: Negative chest pain Continue with lasix.  Increase clonidine dose 1/30.  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 Doppler; negative for DVT 1/26. D dimer increase to 12--13 -back on  heparin gtt.  D dimer down to 3.. if stable for two days will transition soon to DVT prophylaxis dose.   Estimated body mass index is 29.35 kg/m as calculated from the following:   Height as of this encounter: '5\' 7"'$  (1.702 m).   Weight as of this encounter: 85 kg.   DVT prophylaxis: Heparin  Code Status: Full code Family Communication: Care discussed with patient Disposition Plan:  Status is: Inpatient  Remains inpatient appropriate because:IV treatments appropriate due to intensity of illness or inability to take PO   Dispo: The patient is from: Home              Anticipated d/c is to: Home              Anticipated d/c date is: 1 day              Patient currently is not medically stable to d/c. hopefully home on Monday    Difficult to place patient No        Consultants:   Nephrology  Procedures:   None  Antimicrobials:    Subjective: She is feeling better, dyspnea improved Objective: Vitals:   12/30/20 0005 12/30/20 0733 12/30/20 0800 12/30/20 1202  BP: (!) 163/105 (!) 169/115  (!) 163/113  Pulse: 65 63  70  Resp: '20 19  19  '$ Temp: 98.3 F (36.8 C) 99 F (37.2 C)  99 F (37.2 C)  TempSrc: Oral Oral  Oral  SpO2: 100% 100% 100% 100%  Weight:      Height:        Intake/Output Summary (Last 24 hours) at 12/30/2020 1358 Last data filed at 12/30/2020 0520 Gross per 24 hour  Intake 1003.55 ml  Output -  Net 1003.55 ml   Filed Weights   12/24/20 2300 12/26/20 1337 12/26/20 1615  Weight: 84.4 kg 88.4 kg 85 kg    Examination:  General exam: NAD Respiratory system; CTA Cardiovascular system: S 1, S 2 RRR Gastrointestinal system:  BS present, soft,  nt Central nervous system; non focal.  Extremities: plus 2 edema Skin: No rashes.   Data Reviewed: I have personally reviewed following labs and imaging studies  CBC: Recent Labs  Lab 12/24/20 0530 12/25/20 0118 12/26/20 0224 12/27/20 0246 12/28/20 0233 12/30/20 0350  WBC 4.5 5.5 6.1 6.7 8.3 10.0  NEUTROABS 3.3 4.6 4.8 5.2 6.0  --   HGB 7.4* 7.6* 7.6* 8.3* 8.1* 7.8*  HCT 22.6* 23.6* 24.1* 24.5* 25.1* 23.1*  MCV 90.8 91.5 92.7 89.7 91.3 88.5  PLT 86* 130* 152 172 210 [id number]   Basic Metabolic Panel: Recent Labs  Lab 12/24/20 0530 12/25/20 0118 12/26/20 0224 12/27/20 0246 12/28/20 0233 12/29/20 0510 12/30/20 0350  NA 138 135 137 137 136 135 135  K 4.6 4.5 4.9 4.5 4.3 4.3 4.5  CL 99 98 100 100 99 100 100  CO2 '25 24 24 24 23 22 '$ 21*  GLUCOSE 139* 169* 132* 149* 130* 104* 112*  BUN 57* 42* 58* 50* 66* 80* 89*  CREATININE 7.58* 5.97* 6.97* 5.74* 6.10* 6.48* 6.71*  CALCIUM 6.6* 6.8* 6.9* 7.0* 6.9* 7.1* 7.2*  MG 1.8 1.9 1.9 1.8 1.9  --   --   PHOS  --   --   --   --   --  5.4* 5.6*   GFR: Estimated Creatinine Clearance: 12.9 mL/min (A) (by C-G formula based on SCr of 6.71 mg/dL (H)). Liver Function Tests: Recent Labs  Lab 12/24/20 0530 12/25/20 0118 12/26/20 0224 12/27/20 0246 12/28/20 0233 12/29/20 0510 12/30/20 0350  AST '21 21 17 23 21  '$ --   --   ALT '23 22 19 23 24  '$ --   --   ALKPHOS 95 99 92 91 79  --   --   BILITOT 0.8 0.5 0.6 0.5 0.5  --   --   PROT 4.7* 4.8* 5.0* 5.2* 4.8*  --   --   ALBUMIN 1.9* 2.0* 2.0* 2.0* 2.0* 2.0* 2.0*   No results for input(s): LIPASE, AMYLASE in the last 168 hours. No results for input(s): AMMONIA in the last 168 hours. Coagulation Profile: No results for input(s): INR, PROTIME in the last 168 hours. Cardiac Enzymes: No results for input(s): CKTOTAL, CKMB, CKMBINDEX, TROPONINI in the last 168 hours. BNP (last 3 results) No results for input(s): PROBNP in the last 8760 hours. HbA1C: No results for input(s): HGBA1C in the last  72 hours. CBG: Recent Labs  Lab 12/26/20 1738  GLUCAP 186*   Lipid Profile: No results for input(s): CHOL, HDL, LDLCALC, TRIG, CHOLHDL, LDLDIRECT in the last 72 hours. Thyroid Function Tests: No results for input(s): TSH, T4TOTAL, FREET4, T3FREE, THYROIDAB in the last 72 hours. Anemia Panel: Recent Labs    12/28/20 0233  FERRITIN 918*   Sepsis Labs: Recent Labs  Lab 12/23/20 2240  PROCALCITON 4.64    Recent Results (from the past 240 hour(s))  SARS Coronavirus 2 by RT  PCR (hospital order, performed in [facility] hospital lab) Nasopharyngeal Nasopharyngeal Swab     Status: Abnormal   Collection Time: 12/23/20  5:19 PM   Specimen: Nasopharyngeal Swab  Result Value Ref Range Status   SARS Coronavirus 2 POSITIVE (A) NEGATIVE Final    Comment: RESULT CALLED TO, READ BACK BY AND VERIFIED WITH: RN R HARDY [phone] AT 1856 BY CM (NOTE) SARS-CoV-2 target nucleic acids are DETECTED  SARS-CoV-2 RNA is generally detectable in upper respiratory specimens  during the acute phase of infection.  Positive results are indicative  of the presence of the identified virus, but do not rule out bacterial infection or co-infection with other pathogens not detected by the test.  Clinical correlation with patient history and  other diagnostic information is necessary to determine patient infection status.  The expected result is negative.  Fact Sheet for Patients:   StrictlyIdeas.no   Fact Sheet for Healthcare Providers:   BankingDealers.co.za    This test is not yet approved or cleared by the [place] FDA and  has been authorized for detection and/or diagnosis of SARS-CoV-2 by FDA under an Emergency Use Authorization (EUA).  This EUA will remain in effect (meaning this test  can be used) for the duration of  the COVID-19 declaration under Section 564(b)(1) of the Act, 21 U.S.C. section 360-bbb-3(b)(1), unless the authorization  is terminated or revoked sooner.  Performed at [facility], [place] [address], [place], [place] [address]          Radiology Studies: NM Pulmonary Perfusion  Result Date: 12/28/2020 CLINICAL DATA:  Shortness of breath.  COVID-19 positive EXAM: NUCLEAR MEDICINE PERFUSION LUNG SCAN TECHNIQUE: Perfusion images were obtained in multiple projections after intravenous injection of radiopharmaceutical. Views: Anterior, posterior, left lateral, right lateral, RPO, LPO, RAO, LAO. RADIOPHARMACEUTICALS:  4.4 mCi Tc-81mMAA IV COMPARISON:  Chest radiograph December 28, 2020. Perfusion lung scan December 24, 2020 FINDINGS: There is again noted relative decreased uptake in portions of the left lower lobe due to combination of cardiomegaly, consolidation, and pleural effusion. Appearance in the left lower lobe is stable compared to the previous study. Elsewhere, radiotracer uptake is normal. IMPRESSION: Stable appearance compared to lung scan from 4 days prior. Decreased uptake in portions of the left lower lobe ir stable, felt to be due to combination of infiltrate, pleural effusion cardiomegaly. Elsewhere, the distribution of radiotracer uptake is normal. This appearance is not felt to be indicative of pulmonary embolus based on perfusion only imaging. Electronically Signed   By: WLowella GripIII M.D.   On: 12/28/2020 15:37        Scheduled Meds: . albuterol  1-2 puff Inhalation BID  . amLODipine  10 mg Oral Daily  . Chlorhexidine Gluconate Cloth  6 each Topical Q0600  . cholecalciferol  1,000 Units Oral Daily  . cloNIDine  0.3 mg Oral TID  . fenofibrate  160 mg Oral Daily  . folic acid  1 mg Oral Daily  . guaiFENesin  600 mg Oral BID  . ipratropium  2 puff Inhalation BID  . labetalol  400 mg Oral BID  . predniSONE  50 mg Oral Daily  . vitamin B-12  100 mcg Oral Daily   Continuous Infusions: . furosemide Stopped (12/30/20 0520)  . heparin 1,150 Units/hr (12/29/20 2235)  .  nitroGLYCERIN Stopped (12/24/20 1900)     LOS: 7 days    Time spent: 35 minutes.     BElmarie Shiley MD [facility]  If 7PM-7AM, please contact night-coverage [email or web address]  12/30/2020, 1:58 PM

## 2020-12-30 NOTE — Progress Notes (Signed)
[place] for heparin Indication: COVID infection, elevated d-dimer  No Known Allergies  Patient Measurements: Height: '5\' 7"'$  (170.2 cm) Weight: 85 kg (187 lb 6.3 oz) IBW/kg (Calculated) : 61.6 Heparin Dosing Weight: 80kg  Vital Signs: Temp: 99 F (37.2 C) (01/30 0733) Temp Source: Oral (01/30 0733) BP: 169/115 (01/30 0733) Pulse Rate: 63 (01/30 0733)  Labs: Recent Labs    12/28/20 0233 12/29/20 0510 12/30/20 0350  HGB 8.1*  --  7.8*  HCT 25.1*  --  23.1*  PLT 210  --  215  HEPARINUNFRC 0.62 0.70 0.44  CREATININE 6.10* 6.48* 6.71*    Assessment: 39 YOF presenting with SOB, COVID +, d-dimer is elevated but downtrending. D-Dimer 1/30 has decreased significantly to 3.37 She is not on anticoagulation PTA,  Chronic anemia stable: Hgb 7.8; Plt 215  Heparin level with AM labs is therapeutic at 0.44  Goal of Therapy:  Heparin level 0.3-0.7 units/ml Monitor platelets by anticoagulation protocol: Yes   Plan:  Continue heparin at 1150 units/hr F/u daily heparin level Monitor s/sx bleeding, daily CBC F/u long term anticoagulation plans   Wilson Singer, PharmD PGY1 Pharmacy Resident 12/30/2020 8:19 AM

## 2020-12-31 ENCOUNTER — Other Ambulatory Visit (HOSPITAL_COMMUNITY): Payer: Self-pay | Admitting: Internal Medicine

## 2020-12-31 DIAGNOSIS — U071 COVID-19: Secondary | ICD-10-CM | POA: Diagnosis not present

## 2020-12-31 LAB — CBC
HCT: 21.8 % — ABNORMAL LOW (ref 36.0–46.0)
Hemoglobin: 7.2 g/dL — ABNORMAL LOW (ref 12.0–15.0)
MCH: 29.6 pg (ref 26.0–34.0)
MCHC: 33 g/dL (ref 30.0–36.0)
MCV: 89.7 fL (ref 80.0–100.0)
Platelets: 211 10*3/uL (ref 150–400)
RBC: 2.43 MIL/uL — ABNORMAL LOW (ref 3.87–5.11)
RDW: 14.8 % (ref 11.5–15.5)
WBC: 9.1 10*3/uL (ref 4.0–10.5)
nRBC: 0 % (ref 0.0–0.2)

## 2020-12-31 LAB — HEPARIN LEVEL (UNFRACTIONATED): Heparin Unfractionated: 0.3 IU/mL (ref 0.30–0.70)

## 2020-12-31 LAB — D-DIMER, QUANTITATIVE: D-Dimer, Quant: 3.53 ug/mL-FEU — ABNORMAL HIGH (ref 0.00–0.50)

## 2020-12-31 MED ORDER — LABETALOL HCL 200 MG PO TABS: 400.0000 mg | ORAL_TABLET | Freq: Two times a day (BID) | ORAL | 3 refills | Status: DC

## 2020-12-31 MED ORDER — VITAMIN D3 25 MCG PO TABS: 1000.0000 [IU] | ORAL_TABLET | Freq: Every day | ORAL | 0 refills | Status: DC

## 2020-12-31 MED ORDER — IPRATROPIUM BROMIDE HFA 17 MCG/ACT IN AERS: 2.0000 | INHALATION_SPRAY | Freq: Four times a day (QID) | RESPIRATORY_TRACT | 12 refills | Status: DC

## 2020-12-31 MED ORDER — TORSEMIDE 20 MG PO TABS: 60.0000 mg | ORAL_TABLET | Freq: Two times a day (BID) | ORAL | 2 refills | Status: DC

## 2020-12-31 MED ORDER — AMLODIPINE BESYLATE 10 MG PO TABS: 10.0000 mg | ORAL_TABLET | Freq: Every day | ORAL | 2 refills | Status: DC

## 2020-12-31 MED ORDER — FENOFIBRATE 160 MG PO TABS: 160.0000 mg | ORAL_TABLET | Freq: Every day | ORAL | 1 refills | Status: DC

## 2020-12-31 MED ORDER — PANTOPRAZOLE SODIUM 40 MG PO TBEC: 40.0000 mg | DELAYED_RELEASE_TABLET | Freq: Every day | ORAL | 1 refills | Status: DC

## 2020-12-31 MED ORDER — FOLIC ACID 1 MG PO TABS: 1.0000 mg | ORAL_TABLET | Freq: Every day | ORAL | 1 refills | Status: DC

## 2020-12-31 MED ORDER — PREDNISONE 20 MG PO TABS: 40.0000 mg | ORAL_TABLET | Freq: Every day | ORAL | 3 refills | Status: DC

## 2020-12-31 MED ORDER — GUAIFENESIN ER 600 MG PO TB12: 600.0000 mg | ORAL_TABLET | Freq: Two times a day (BID) | ORAL | 0 refills | Status: DC

## 2020-12-31 MED ORDER — CLONIDINE HCL 0.3 MG PO TABS: 0.3000 mg | ORAL_TABLET | Freq: Three times a day (TID) | ORAL | 2 refills | Status: DC

## 2020-12-31 MED ORDER — ALBUTEROL SULFATE HFA 108 (90 BASE) MCG/ACT IN AERS: 1.0000 | INHALATION_SPRAY | Freq: Two times a day (BID) | RESPIRATORY_TRACT | 2 refills | Status: DC

## 2020-12-31 MED ORDER — CYANOCOBALAMIN 100 MCG PO TABS: 100.0000 ug | ORAL_TABLET | Freq: Every day | ORAL | 1 refills | Status: DC

## 2020-12-31 MED FILL — ATROVENT HFA INHALER: 17 | 30 days supply | Qty: 13 | Fill #0

## 2020-12-31 MED FILL — VITAMIN D3 25 MCG TABS: 25 | 30 days supply | Qty: 30 | Fill #0

## 2020-12-31 MED FILL — predniSONE 20 MG TABS: 20 | 30 days supply | Qty: 60 | Fill #0

## 2020-12-31 MED FILL — AMLODIPINE BESYLATE 10 MG T: 10 | 30 days supply | Qty: 30 | Fill #0

## 2020-12-31 MED FILL — MUCUS RELIEF 600 MG TB12: 600 | 5 days supply | Qty: 10 | Fill #0

## 2020-12-31 MED FILL — LABETALOL HCL 200 MG TABS: 200 | 30 days supply | Qty: 120 | Fill #0

## 2020-12-31 MED FILL — TORSEMIDE 20 MG TABLET: 20 | 30 days supply | Qty: 180 | Fill #0

## 2020-12-31 MED FILL — cloNIDine HCL 0.3 MG TABS: 0.3 | 30 days supply | Qty: 90 | Fill #0

## 2020-12-31 MED FILL — PROAIR HFA 90 MCG INHALER: 108 (90 BAS | 30 days supply | Qty: 9 | Fill #0

## 2020-12-31 MED FILL — PANTOPRAZOLE SOD DR 40 MG T: 40 | 30 days supply | Qty: 30 | Fill #0

## 2020-12-31 MED FILL — FOLIC ACID 1 MG TABS: 1 | 30 days supply | Qty: 30 | Fill #0

## 2020-12-31 MED FILL — VITAMIN B-12 1000 MCG TABS: 1000 | 30 days supply | Qty: 30 | Fill #0

## 2020-12-31 NOTE — Progress Notes (Signed)
Cathy [name] to be D/C'd Home per MD order.  Discussed with the patient and all questions fully answered.  VSS, Skin clean, dry and intact without evidence of skin break down, no evidence of skin tears noted. IV catheter discontinued intact. Site without signs and symptoms of complications. Dressing and pressure applied.  An After Visit Summary was printed and given to the patient. Patient received prescription medications.  D/c education completed with patient/family including follow up instructions, medication list, d/c activities limitations if indicated, with other d/c instructions as indicated by MD - patient able to verbalize understanding, all questions fully answered.   Patient instructed to return to ED, call 911, or call MD for any changes in condition.   Patient escorted via [place], and D/C home via private auto.  Cathy [name] 12/31/2020 1:04 PM

## 2020-12-31 NOTE — Plan of Care (Signed)
  Problem: Education: Goal: Knowledge of risk factors and measures for prevention of condition will improve Outcome: Progressing   Problem: Clinical Measurements: Goal: Ability to maintain clinical measurements within normal limits will improve Outcome: Progressing Goal: Will remain free from infection Outcome: Progressing Goal: Diagnostic test results will improve Outcome: Progressing Goal: Cardiovascular complication will be avoided Outcome: Progressing   Problem: Health Behavior/Discharge Planning: Goal: Ability to manage health-related needs will improve Outcome: Progressing   Problem: Clinical Measurements: Goal: Will remain free from infection Outcome: Progressing Goal: Cardiovascular complication will be avoided Outcome: Progressing   Problem: Activity: Goal: Risk for activity intolerance will decrease Outcome: Progressing   Problem: Nutrition: Goal: Adequate nutrition will be maintained Outcome: Progressing   Problem: Coping: Goal: Level of anxiety will decrease Outcome: Progressing   Problem: Pain Managment: Goal: General experience of comfort will improve Outcome: Progressing   Problem: Safety: Goal: Ability to remain free from injury will improve Outcome: Progressing   Problem: Skin Integrity: Goal: Risk for impaired skin integrity will decrease Outcome: Progressing

## 2020-12-31 NOTE — Discharge Summary (Signed)
Physician Discharge Summary  Cathy [name] [id number] DOB: 1981-10-14 DOA: 12/23/2020  PCP: Patient, No Pcp Per  Admit date: 12/23/2020 Discharge date: 12/31/2020  Admitted From: Home  Disposition:  Home   Recommendations for Outpatient Follow-up:  1. Follow up with PCP in 1-2 weeks 2. Please obtain BMP/CBC in one week 3. Needs to follow up with nephrology for further care lupus nephritis.  4. Needs follow up rheumatology   Home Health: none  Discharge Condition: Stable.  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  Patient presented with shortness of breath that started 2 days prior to admission became acutely worse the day of admission.  Patient has been newly diagnosed with lupus nephritis and is started on dialysis.  She have to be able to take her home medications because she could not afford them.  She received a dose of Cytoxan 1/8. Arrival she was hypoxic initially requiring nonrebreather mask subsequently transition to nasal cannula.  BNP 4500, mild elevated troponin, chest x-ray consistent with fluid overload bibasilar atelectasis.  COVID-19 PCR came back positive.   1-Acute Hypoxic Respiratory Failure secondary to pulmonary edema and COVID-19 pneumonia: Completed Remdesivir and IV steroids 4 days. Now on oral prednisone.  Initially required NBM then transition to  RA by the time of discharge.  She could tolerates BIPAP.  Treated with IV lasix and HD.  D dimer increasing 12--13. Started heparin gtt. Repeated VQ scan negative.  D dimer trending down today at 3. DC heparin gtt.  needs to quarantine for 21 days.   COVID-19 Labs  Recent Labs    12/29/20 0510 12/30/20 0350 12/31/20 0915  DDIMER 3.80* 3.37* 3.53*  CRP 1.8* 3.4*  --     Lab Results   Component Value Date   SARSCOV2NAA POSITIVE (A) 12/23/2020   SARSCOV2NAA NEGATIVE 11/29/2020   [place] NEGATIVE 11/07/2020   2-Acute diastolic heart failure exacerbation Treated  with IV Lasix. Discharge on torsemide.  Treated  with hemodialysis resolved   3-Hypertensive emergency: Continue with Norvasc, clonidine, labetalol Elevated troponin: Negative chest pain Continue with lasix.  Increase clonidine dose 1/30. Medications send to TOC> she will get her meds prior to discharge.   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   /Bipolar depression: Continue with Ativan as needed Anemia, anemia of chronic disease: Continue to monitor hemoglobin Hb stable.   Elevated D-dimer, VQ scan negative: Discontinue heparin drip 1/25. Doppler; negative for DVT 1/26. D dimer increase to 12--13 -back on  heparin gtt.  D dimer down to 3.. ok to discontinue heparin.    Discharge Diagnoses:  Principal Problem:   COVID-19 virus infection Active Problems:   Flash pulmonary edema (HCC)   Hypertensive emergency   CHF exacerbation (HCC)   ESRD (end stage renal disease) ([place])   Elevated troponin   Respiratory failure ([place])    Discharge Instructions  Discharge Instructions    Diet - low sodium heart healthy   Complete by: As directed    Increase activity slowly   Complete by: As directed      Allergies as of 12/31/2020   No Known Allergies     Medication List    STOP taking these medications   polyethylene glycol 17 g packet Commonly known as: MIRALAX /  GLYCOLAX     TAKE these medications   albuterol 108 (90 Base) MCG/ACT inhaler Commonly known as: ProAir HFA Inhale 1-2 puffs into the lungs 2 (two) times daily.   amLODipine 10 MG tablet Commonly known as: NORVASC Take 1 tablet (10 mg total) by mouth daily.   cloNIDine 0.3 MG tablet Commonly known as: CATAPRES Take 1 tablet (0.3 mg  total) by mouth 3 (three) times daily. What changed:   medication strength  how much to take   cyanocobalamin 100 MCG tablet Take 1 tablet (100 mcg total) by mouth daily.   fenofibrate 160 MG tablet Take 1 tablet (160 mg total) by mouth daily.   folic acid 1 MG tablet Commonly known as: FOLVITE Take 1 tablet (1 mg total) by mouth daily.   guaiFENesin 600 MG 12 hr tablet Commonly known as: MUCINEX Take 1 tablet (600 mg total) by mouth 2 (two) times daily.   hydrocortisone cream 1 % Apply topically 2 (two) times daily.   ipratropium 17 MCG/ACT inhaler Commonly known as: ATROVENT HFA Inhale 2 puffs into the lungs every 6 (six) hours.   labetalol 200 MG tablet Commonly known as: NORMODYNE Take 2 tablets (400 mg total) by mouth 2 (two) times daily.   pantoprazole 40 MG tablet Commonly known as: PROTONIX Take 1 tablet (40 mg total) by mouth daily.   predniSONE 20 MG tablet Commonly known as: DELTASONE Take 2 tablets (40 mg total) by mouth daily with breakfast.   torsemide 20 MG tablet Commonly known as: DEMADEX Take 3 tablets (60 mg total) by mouth 2 (two) times daily.   Vitamin D3 25 MCG tablet Commonly known as: Vitamin D Take 1 tablet (1,000 Units total) by mouth daily.       No Known Allergies  Consultations:  nephrology    Procedures/Studies: NM Pulmonary Perfusion  Result Date: 12/28/2020 CLINICAL DATA:  Shortness of breath.  COVID-19 positive EXAM: NUCLEAR MEDICINE PERFUSION LUNG SCAN TECHNIQUE: Perfusion images were obtained in multiple projections after intravenous injection of radiopharmaceutical. Views: Anterior, posterior, left lateral, right lateral, RPO, LPO, RAO, LAO. RADIOPHARMACEUTICALS:  4.4 mCi Tc-16mMAA IV COMPARISON:  Chest radiograph December 28, 2020. Perfusion lung scan December 24, 2020 FINDINGS: There is again noted relative decreased uptake in portions of the left lower lobe due to combination of cardiomegaly, consolidation, and pleural  effusion. Appearance in the left lower lobe is stable compared to the previous study. Elsewhere, radiotracer uptake is normal. IMPRESSION: Stable appearance compared to lung scan from 4 days prior. Decreased uptake in portions of the left lower lobe ir stable, felt to be due to combination of infiltrate, pleural effusion cardiomegaly. Elsewhere, the distribution of radiotracer uptake is normal. This appearance is not felt to be indicative of pulmonary embolus based on perfusion only imaging. Electronically Signed   By: WLowella GripIII M.D.   On: 12/28/2020 15:37   NM Pulmonary Perfusion  Result Date: 12/24/2020 CLINICAL DATA:  End-stage renal disease, dyspnea, short of breath for 2 days, COVID-19 positive EXAM: NUCLEAR MEDICINE PERFUSION LUNG SCAN TECHNIQUE: Perfusion images were obtained in multiple projections after intravenous injection of radiopharmaceutical. Ventilation scans intentionally deferred if perfusion scan and chest x-ray adequate for interpretation during COVID 19 epidemic. RADIOPHARMACEUTICALS:  4.2 mCi Tc-948mAA IV COMPARISON:  12/23/2020 FINDINGS: Planar imaging the chest was performed during the perfusion examination. Decreased radiotracer uptake within the left lower hemithorax corresponds to cardiomegaly, pleural fluid, and left basilar consolidation seen on recent chest x-ray. There are no  wedge-shaped perfusion defect to suggest pulmonary embolus. IMPRESSION: 1. No evidence of pulmonary embolism utilizing PISAPED criteria for perfusion only imaging. Decreased radiotracer in the lower left hemithorax corresponds to consolidation, effusion, and cardiomegaly seen on chest x-ray. Electronically Signed   By: Randa Ngo M.D.   On: 12/24/2020 15:04   DG CHEST PORT 1 VIEW  Result Date: 12/28/2020 CLINICAL DATA:  Chest pain.  COVID positive. EXAM: PORTABLE CHEST 1 VIEW COMPARISON:  Radiographs 12/23/2020 and 11/29/2020. FINDINGS: 0936 hours. Right IJ central venous catheter is  unchanged at the level of the superior right atrium. The heart size and mediastinal contours are stable. Significant interval improvement in the pulmonary aeration with residual patchy airspace opacities at both lung bases and small bilateral pleural effusions. No definite residual edema or pneumothorax. The bones appear unremarkable. IMPRESSION: Significant interval improvement in pulmonary aeration with probable residual bibasilar atelectasis and small bilateral pleural effusions. Electronically Signed   By: Richardean Sale M.D.   On: 12/28/2020 09:49   DG Chest Port 1 View  Result Date: 12/23/2020 CLINICAL DATA:  40 year old female with history of shortness of breath. EXAM: PORTABLE CHEST 1 VIEW COMPARISON:  Chest x-ray 11/29/2020. FINDINGS: Right internal jugular PermCath with tip terminating at the superior cavoatrial junction. There is cephalization of the pulmonary vasculature, indistinctness of the interstitial markings, and patchy airspace disease throughout the lungs bilaterally suggestive of moderate pulmonary edema. Moderate bilateral pleural effusions. Bibasilar areas of atelectasis. Mild cardiomegaly. IMPRESSION: 1. The appearance of the chest is most suggestive of fluid overload and/or heart failure, as detailed above. 2. Bibasilar areas of atelectasis. Electronically Signed   By: Vinnie Langton M.D.   On: 12/23/2020 18:11   US BIOPSY (KIDNEY)  Result Date: 12/03/2020 INDICATION: 40 year old female with history of SLE and CKD presenting with AK I. EXAM: ULTRASOUND GUIDED RENAL BIOPSY COMPARISON:  11/30/2020 MEDICATIONS: None. ANESTHESIA/SEDATION: Fentanyl 100 mcg IV; Versed 2 mg IV Total Moderate Sedation time: 15 minutes; The patient was continuously monitored during the procedure by the interventional radiology nurse under my direct supervision. COMPLICATIONS: None immediate. PROCEDURE: Informed written consent was obtained from the patient after a discussion of the risks, benefits and  alternatives to treatment. The patient understands and consents the procedure. A timeout was performed prior to the initiation of the procedure. Ultrasound scanning was performed of the bilateral flanks. The inferior pole of the left kidney was selected for biopsy due to location and sonographic window. The procedure was planned. The operative site was prepped and draped in the usual sterile fashion. The overlying soft tissues were anesthetized with 1% lidocaine with epinephrine. A 16 gauge core needle biopsy device was advanced into the inferior cortex of the left kidney and 2 core biopsies were obtained under direct ultrasound guidance. Images were saved for documentation purposes. The biopsy device was removed and hemostasis was obtained with manual compression. Post procedural scanning was negative for significant post procedural hemorrhage or additional complication. A dressing was placed. The patient tolerated the procedure well without immediate post procedural complication. IMPRESSION: Technically successful ultrasound guided left renal biopsy. Ruthann Cancer, MD [facility] Electronically Signed   By: Ruthann Cancer MD   On: 12/03/2020 16:23   VAS [place] LOWER EXTREMITY VENOUS (DVT)  Result Date: 12/26/2020  Lower Venous DVT Study Indications: Edema.  Comparison Study: 09/27/18 previous Performing Technologist: Abram Sander RVS  Examination Guidelines: A complete evaluation includes B-mode imaging, spectral Doppler, color Doppler, and power Doppler as needed of  all accessible portions of each vessel. Bilateral testing is considered an integral part of a complete examination. Limited examinations for reoccurring indications may be performed as noted. The reflux portion of the exam is performed with the patient in reverse Trendelenburg.  +---------+---------------+---------+-----------+----------+--------------+ RIGHT     CompressibilityPhasicitySpontaneityPropertiesThrombus Aging +---------+---------------+---------+-----------+----------+--------------+ CFV      Full           Yes      Yes                                 +---------+---------------+---------+-----------+----------+--------------+ SFJ      Full                                                        +---------+---------------+---------+-----------+----------+--------------+ FV Prox  Full                                                        +---------+---------------+---------+-----------+----------+--------------+ FV Mid   Full                                                        +---------+---------------+---------+-----------+----------+--------------+ FV DistalFull                                                        +---------+---------------+---------+-----------+----------+--------------+ PFV      Full                                                        +---------+---------------+---------+-----------+----------+--------------+ POP      Full           Yes      Yes                                 +---------+---------------+---------+-----------+----------+--------------+ PTV      Full                                                        +---------+---------------+---------+-----------+----------+--------------+ PERO     Full                                                        +---------+---------------+---------+-----------+----------+--------------+   +---------+---------------+---------+-----------+----------+--------------+ LEFT  CompressibilityPhasicitySpontaneityPropertiesThrombus Aging +---------+---------------+---------+-----------+----------+--------------+ CFV      Full           Yes      Yes                                 +---------+---------------+---------+-----------+----------+--------------+ SFJ      Full                                                         +---------+---------------+---------+-----------+----------+--------------+ FV Prox  Full                                                        +---------+---------------+---------+-----------+----------+--------------+ FV Mid   Full                                                        +---------+---------------+---------+-----------+----------+--------------+ FV DistalFull                                                        +---------+---------------+---------+-----------+----------+--------------+ PFV      Full                                                        +---------+---------------+---------+-----------+----------+--------------+ POP      Full           Yes      Yes                                 +---------+---------------+---------+-----------+----------+--------------+ PTV      Full                                                        +---------+---------------+---------+-----------+----------+--------------+ PERO     Full                                                        +---------+---------------+---------+-----------+----------+--------------+     Summary: BILATERAL: - No evidence of deep vein thrombosis seen in the lower extremities, bilaterally. - No evidence of superficial venous thrombosis in the lower extremities, bilaterally. -No evidence of popliteal cyst, bilaterally.   *See table(s) above for measurements and observations. Electronically signed by Harold Barban MD  on 12/26/2020 at 9:40:35 PM.    Final       Subjective: Feeling better.   Discharge Exam: Vitals:   12/31/20 0447 12/31/20 0732  BP: (!) 179/122 (!) 188/138  Pulse: 60 69  Resp: 20 20  Temp: 97.8 F (36.6 C) 97.9 F (36.6 C)  SpO2: 100% 100%     General: Pt is alert, awake, not in acute distress Cardiovascular: RRR, S1/S2 +, no rubs, no gallops Respiratory: CTA bilaterally, no wheezing, no rhonchi Abdominal: Soft, NT, ND, bowel  sounds + Extremities: no edema, no cyanosis    The results of significant diagnostics from this hospitalization (including imaging, microbiology, ancillary and laboratory) are listed below for reference.     Microbiology: Recent Results (from the past 240 hour(s))  SARS Coronavirus 2 by RT PCR (hospital order, performed in [facility] hospital lab) Nasopharyngeal Nasopharyngeal Swab     Status: Abnormal   Collection Time: 12/23/20  5:19 PM   Specimen: Nasopharyngeal Swab  Result Value Ref Range Status   SARS Coronavirus 2 POSITIVE (A) NEGATIVE Final    Comment: RESULT CALLED TO, READ BACK BY AND VERIFIED WITH: RN R HARDY [id number] AT 1856 BY CM (NOTE) SARS-CoV-2 target nucleic acids are DETECTED  SARS-CoV-2 RNA is generally detectable in upper respiratory specimens  during the acute phase of infection.  Positive results are indicative  of the presence of the identified virus, but do not rule out bacterial infection or co-infection with other pathogens not detected by the test.  Clinical correlation with patient history and  other diagnostic information is necessary to determine patient infection status.  The expected result is negative.  Fact Sheet for Patients:   StrictlyIdeas.no   Fact Sheet for Healthcare Providers:   BankingDealers.co.za    This test is not yet approved or cleared by the [place] FDA and  has been authorized for detection and/or diagnosis of SARS-CoV-2 by FDA under an Emergency Use Authorization (EUA).  This EUA will remain in effect (meaning this test  can be used) for the duration of  the COVID-19 declaration under Section 564(b)(1) of the Act, 21 U.S.C. section 360-bbb-3(b)(1), unless the authorization is terminated or revoked sooner.  Performed at [facility], [place] [address], [place], [place] [address]      Labs: BNP (last 3 results) Recent Labs    11/29/20 2051 12/23/20 1744  12/23/20 2030  BNP >4,500.0* >4,500.0* [id number]*   Basic Metabolic Panel: Recent Labs  Lab 12/25/20 0118 12/26/20 0224 12/27/20 0246 12/28/20 0233 12/29/20 0510 12/30/20 0350  NA 135 137 137 136 135 135  K 4.5 4.9 4.5 4.3 4.3 4.5  CL 98 100 100 99 100 100  CO2 '24 24 24 23 22 '$ 21*  GLUCOSE 169* 132* 149* 130* 104* 112*  BUN 42* 58* 50* 66* 80* 89*  CREATININE 5.97* 6.97* 5.74* 6.10* 6.48* 6.71*  CALCIUM 6.8* 6.9* 7.0* 6.9* 7.1* 7.2*  MG 1.9 1.9 1.8 1.9  --   --   PHOS  --   --   --   --  5.4* 5.6*   Liver Function Tests: Recent Labs  Lab 12/25/20 0118 12/26/20 0224 12/27/20 0246 12/28/20 0233 12/29/20 0510 12/30/20 0350  AST '21 17 23 21  '$ --   --   ALT '22 19 23 24  '$ --   --   ALKPHOS 99 92 91 79  --   --   BILITOT 0.5 0.6 0.5 0.5  --   --  PROT 4.8* 5.0* 5.2* 4.8*  --   --   ALBUMIN 2.0* 2.0* 2.0* 2.0* 2.0* 2.0*   No results for input(s): LIPASE, AMYLASE in the last 168 hours. No results for input(s): AMMONIA in the last 168 hours. CBC: Recent Labs  Lab 12/25/20 0118 12/26/20 0224 12/27/20 0246 12/28/20 0233 12/30/20 0350 12/31/20 0324  WBC 5.5 6.1 6.7 8.3 10.0 9.1  NEUTROABS 4.6 4.8 5.2 6.0  --   --   HGB 7.6* 7.6* 8.3* 8.1* 7.8* 7.2*  HCT 23.6* 24.1* 24.5* 25.1* 23.1* 21.8*  MCV 91.5 92.7 89.7 91.3 88.5 89.7  PLT 130* 152 172 210 215 211   Cardiac Enzymes: No results for input(s): CKTOTAL, CKMB, CKMBINDEX, TROPONINI in the last 168 hours. BNP: Invalid input(s): POCBNP CBG: Recent Labs  Lab 12/26/20 1738  GLUCAP 186*   D-Dimer Recent Labs    12/30/20 0350 12/31/20 0915  DDIMER 3.37* 3.53*   Hgb A1c No results for input(s): HGBA1C in the last 72 hours. Lipid Profile No results for input(s): CHOL, HDL, LDLCALC, TRIG, CHOLHDL, LDLDIRECT in the last 72 hours. Thyroid function studies No results for input(s): TSH, T4TOTAL, T3FREE, THYROIDAB in the last 72 hours.  Invalid input(s): FREET3 Anemia work up No results for input(s): VITAMINB12,  FOLATE, FERRITIN, TIBC, IRON, RETICCTPCT in the last 72 hours. Urinalysis    Component Value Date/Time   COLORURINE YELLOW 11/29/2020 2054   APPEARANCEUR HAZY (A) 11/29/2020 2054   LABSPEC 1.015 11/29/2020 2054   PHURINE 6.0 11/29/2020 2054   GLUCOSEU NEGATIVE 11/29/2020 2054   HGBUR MODERATE (A) 11/29/2020 2054   [place] NEGATIVE 11/29/2020 2054   [place] 11/29/2020 2054   PROTEINUR >=300 (A) 11/29/2020 2054   UROBILINOGEN 0.2 10/17/2020 1026   NITRITE NEGATIVE 11/29/2020 2054   LEUKOCYTESUR NEGATIVE 11/29/2020 2054   Sepsis Labs Invalid input(s): PROCALCITONIN,  WBC,  LACTICIDVEN Microbiology Recent Results (from the past 240 hour(s))  SARS Coronavirus 2 by RT PCR (hospital order, performed in [facility] hospital lab) Nasopharyngeal Nasopharyngeal Swab     Status: Abnormal   Collection Time: 12/23/20  5:19 PM   Specimen: Nasopharyngeal Swab  Result Value Ref Range Status   SARS Coronavirus 2 POSITIVE (A) NEGATIVE Final    Comment: RESULT CALLED TO, READ BACK BY AND VERIFIED WITH: RN R HARDY [id number] AT 1856 BY CM (NOTE) SARS-CoV-2 target nucleic acids are DETECTED  SARS-CoV-2 RNA is generally detectable in upper respiratory specimens  during the acute phase of infection.  Positive results are indicative  of the presence of the identified virus, but do not rule out bacterial infection or co-infection with other pathogens not detected by the test.  Clinical correlation with patient history and  other diagnostic information is necessary to determine patient infection status.  The expected result is negative.  Fact Sheet for Patients:   StrictlyIdeas.no   Fact Sheet for Healthcare Providers:   BankingDealers.co.za    This test is not yet approved or cleared by the [place] FDA and  has been authorized for detection and/or diagnosis of SARS-CoV-2 by FDA under an Emergency Use Authorization (EUA).  This EUA  will remain in effect (meaning this test  can be used) for the duration of  the COVID-19 declaration under Section 564(b)(1) of the Act, 21 U.S.C. section 360-bbb-3(b)(1), unless the authorization is terminated or revoked sooner.  Performed at [facility], [place] [address], [place], [place] [address]      Time coordinating discharge: 40 minutes  SIGNED:  Elmarie Shiley, MD  [facility]

## 2020-12-31 NOTE — Progress Notes (Signed)
Patient is ready for discharge per Dr. Tyrell Antonio. Navigator spoke with patient who has already called her outpatient HD clinic to confirm COVID isolation treatment time today-4pm and let them know that she will be coming after discharge today. She confirmed that she has transportation and is just eager to discharge. Navigator asked Renal PA to send orders to [place]. Patient will NOT have HD in the hospital prior to discharge.   Alphonzo Cruise, [place] Renal Navigator [phone]

## 2021-01-01 ENCOUNTER — Telehealth (HOSPITAL_COMMUNITY): Payer: Self-pay | Admitting: Nephrology

## 2021-01-01 ENCOUNTER — Telehealth: Payer: Self-pay | Admitting: Hematology and Oncology

## 2021-01-01 NOTE — Telephone Encounter (Signed)
  Patient was admitted to the hospital from 12/23/2020 - 12/31/2020 with COVID pneumonia.  We need to find out from her referring doctor, does she need to wait 2 or 3 weeks before she gets chemotherapy (our policy is 3 weeks for immunocompromised patients).  Thanks,  M

## 2021-01-01 NOTE — Telephone Encounter (Signed)
01/01/2021 Called pt and informed her that missed appts from 12/26/20 for lab/MD/Cytoaxn have been r/s for 01/07/21. Gave pt address/directions to clinic and call back number in case she had any questions. Pt confirmed appt SRW

## 2021-01-01 NOTE — Telephone Encounter (Signed)
Transition of care contact from inpatient facility  Date of discharge: 12/31/2020 Date of contact: 01/01/2021 Method: Phone Spoke to: Patient  Patient contacted to discuss transition of care from recent inpatient hospitalization. Patient was admitted to [facility] from 1/23 - 12/31/2020 with discharge diagnosis of acute hypoxic respiratory failure with pulmonary edema and COVID-19.  Medication changes were reviewed - she reports that she has everything.  Patient will follow up with his/her outpatient HD unit on: MWF '@4p'$  while on COVID isolation, then move to TTS schedule. She DID attend her dialysis yesterday.   She is asking about an abdominal binder for pain with standing d/t edema - will pass message to outpatient HD SW to assist.  Veneta Penton, PA-C [facility] Pager [phone]

## 2021-01-02 ENCOUNTER — Telehealth: Payer: Self-pay | Admitting: Hematology and Oncology

## 2021-01-02 ENCOUNTER — Encounter: Payer: Medicaid Other | Admitting: Hematology and Oncology

## 2021-01-02 ENCOUNTER — Other Ambulatory Visit: Payer: Medicaid Other

## 2021-01-02 ENCOUNTER — Ambulatory Visit: Payer: Medicaid Other

## 2021-01-02 NOTE — Telephone Encounter (Signed)
Pt left a VM requesting to r/s appt on 01/07/21. Returned call-no answer and mailbox full.

## 2021-01-03 ENCOUNTER — Telehealth: Payer: Self-pay

## 2021-01-03 NOTE — Progress Notes (Incomplete)
[facility]  [address] [place], [place] [address] Phone: [phone]  Fax: [phone]   Clinic Day:  01/03/2021  Referring physician: Ramiro Harvest*  Chief Complaint: Cathy [name] is a 40 y.o. 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 Hollie Salk for assessment and management.   HPI: The patient was admitted to [facility] from 11/29/2020 - 12/14/2020 for acute renal failure. On admission, her hemoglobin was 6.5; she received 1 unit of PRBCs on 12/11/2020. Her creatinine was 6.59 and her potassium was 6.4. Her baseline creatinine is in the 2.5-3 range. Nephrology was consulted. She underwent *** hemodialysis placement on 11/30/2020.  Kidney biopsy on 12/03/2020 revealed diffuse lupus nephritis in an active and sclerosing phase with 60% cellular to vocal fibrocellular crescent formation, focal tuft fibrinoid necrosis, and focal segmental glomerular tuft sclerosis. 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  The patient was treated with high-dose IV Solu-Medrol and was then transitioned to prednisone. She received her first dose of cytoxan on 12/08/2020.  The patient was admitted to [facility] from 12/23/2020 - 12/31/2020 for COVID-19. She completed 4 days of Remdesivir and IV steroids. She was then switched to oral prednisone. Her D-dimers were elevated; lower extremity venous duplex was negative. She was put on Heparin and it was discontinued when her D-Dimers returned to normal.  She was referred to hematology for Cytoxan 500 mg in 1000 mL of sodium chloride 0.9% IV to infuse over 120 minutes. She is to receive treatment every 2 weeks x 2.5 months. Specifically, she is not to receive mesna or fluids since she is on dialysis.  She received a blood transfusion on 11/08/2020.  Labs followed: 11/06/2020: Hematocrit 22.9,  hemoglobin   7.5, MCV 87.7, platelets 147,000, WBC   7,700. Creatinine 2.62 (CrCl 24 ml/min). BUN 54. Calcium 7.1, albumin 1.6. total protein 5.0. 12/12/2020: Hematocrit 24.4, hemoglobin   8.2, MCV 90.0, platelets 118,000, WBC   7,400. Creatinine 4.90 (CrCl 11 ml/min). BUN 40. Calcium 7.2, albumin 2.3. 12/23/2020: Hematocrit 30.6, hemoglobin   9.5, MCV 91.9, platelets 141,000, WBC 12,800. 12/31/2020: Hematocrit 21.8, hemoglobin   7.2, MCV 89.7, platelets 211,000, WBC   9,100. Creatinine 7.25 (CrCl   7 ml/min). BUN 51. Calcium 7.2, albumin 2.4. Total protein 5.8.  The patient is currently on folic acid, vitamin 123456, and EPO (?).  Symptomatically, ***  Past Medical History:  Diagnosis Date  . Anxiety   . Asthma   . Bipolar depression ([place])   . Depression   . Gonorrhea   . Lupus ([place])   . Pelvic inflammatory disease (PID)   . Renal hypertension   . Trichomonas infection     Past Surgical History:  Procedure Laterality Date  . IR FLUORO GUIDE CV LINE RIGHT  11/30/2020  . IR US GUIDE VASC ACCESS RIGHT  11/30/2020  . RENAL BIOPSY    . TUBAL LIGATION N/A 02/03/2019   Procedure: POST PARTUM TUBAL LIGATION;  Surgeon: Aletha Halim, MD;  Location: [facility] LD ORS;  Service: Gynecology;  Laterality: N/A;    Family History  Problem Relation Age of Onset  . Diabetes Maternal Grandmother   . Hypertension Maternal Grandmother   . Diabetes Maternal Grandfather   . Hypertension Maternal Grandfather   . Diabetes Paternal Grandmother   . Hypertension Paternal Grandmother   . Diabetes Paternal Grandfather   . Hypertension Paternal Grandfather     Social  History:  reports that she quit smoking about 2 months ago. Her smoking use included cigarettes. She smoked 0.25 packs per day. She has never used smokeless tobacco. She reports previous alcohol use. She reports previous drug use. Drug: Marijuana.The patient is ***alone/accompanied by *** today.  Allergies: No Known Allergies  Current  Medications: Current Outpatient Medications  Medication Sig Dispense Refill  . albuterol (PROAIR HFA) 108 (90 Base) MCG/ACT inhaler Inhale 1-2 puffs into the lungs 2 (two) times daily. 1 each 2  . amLODipine (NORVASC) 10 MG tablet Take 1 tablet (10 mg total) by mouth daily. 30 tablet 2  . cloNIDine (CATAPRES) 0.3 MG tablet Take 1 tablet (0.3 mg total) by mouth 3 (three) times daily. 90 tablet 2  . cyanocobalamin 100 MCG tablet Take 1 tablet (100 mcg total) by mouth daily. 30 tablet 1  . fenofibrate 160 MG tablet Take 1 tablet (160 mg total) by mouth daily. 30 tablet 1  . folic acid (FOLVITE) 1 MG tablet Take 1 tablet (1 mg total) by mouth daily. 30 tablet 1  . guaiFENesin (MUCINEX) 600 MG 12 hr tablet Take 1 tablet (600 mg total) by mouth 2 (two) times daily. 30 tablet 0  . hydrocortisone cream 1 % Apply topically 2 (two) times daily. 30 g 0  . ipratropium (ATROVENT HFA) 17 MCG/ACT inhaler Inhale 2 puffs into the lungs every 6 (six) hours. 1 each 12  . labetalol (NORMODYNE) 200 MG tablet Take 2 tablets (400 mg total) by mouth 2 (two) times daily. 60 tablet 3  . pantoprazole (PROTONIX) 40 MG tablet Take 1 tablet (40 mg total) by mouth daily. 30 tablet 1  . predniSONE (DELTASONE) 20 MG tablet Take 2 tablets (40 mg total) by mouth daily with breakfast. 60 tablet 3  . torsemide (DEMADEX) 20 MG tablet Take 3 tablets (60 mg total) by mouth 2 (two) times daily. 60 tablet 2  . Vitamin D3 (VITAMIN D) 25 MCG tablet Take 1 tablet (1,000 Units total) by mouth daily. 30 tablet 0   No current facility-administered medications for this visit.    Review of Systems  Constitutional: Negative for chills, diaphoresis, fever, malaise/fatigue and weight loss.  HENT: Negative for congestion, ear discharge, ear pain, hearing loss, nosebleeds, sinus pain, sore throat and tinnitus.   Eyes: Negative for blurred vision and double vision.  Respiratory: Negative for cough, hemoptysis, sputum production and shortness of  breath.   Cardiovascular: Negative for chest pain, palpitations and leg swelling.  Gastrointestinal: Negative for abdominal pain, blood in stool, constipation, diarrhea, heartburn, melena, nausea and vomiting.  Genitourinary: Negative for dysuria, frequency, hematuria and urgency.  Musculoskeletal: Negative for back pain, joint pain, myalgias and neck pain.  Skin: Negative for itching and rash.  Neurological: Negative for dizziness, tingling, sensory change, weakness and headaches.  Endo/Heme/Allergies: Does not bruise/bleed easily.  Psychiatric/Behavioral: Negative for depression and memory loss. The patient is not nervous/anxious and does not have insomnia.   All other systems reviewed and are negative.  Performance status (ECOG): ***  Vitals unknown if currently breastfeeding.   Physical Exam Vitals and nursing note reviewed.  Constitutional:      General: She is not in acute distress.    Appearance: She is not diaphoretic.  HENT:     Head: Normocephalic and atraumatic.     Mouth/Throat:     Mouth: Mucous membranes are moist.     Pharynx: Oropharynx is clear.  Eyes:     General: No scleral icterus.  Extraocular Movements: Extraocular movements intact.     Conjunctiva/sclera: Conjunctivae normal.     Pupils: Pupils are equal, round, and reactive to light.  Cardiovascular:     Rate and Rhythm: Normal rate and regular rhythm.     Heart sounds: Normal heart sounds. No murmur heard.   Pulmonary:     Effort: Pulmonary effort is normal. No respiratory distress.     Breath sounds: Normal breath sounds. No wheezing or rales.  Chest:     Chest wall: No tenderness.  Breasts:     Right: No axillary adenopathy or supraclavicular adenopathy.     Left: No axillary adenopathy or supraclavicular adenopathy.    Abdominal:     General: Bowel sounds are normal. There is no distension.     Palpations: Abdomen is soft. There is no mass.     Tenderness: There is no abdominal  tenderness. There is no guarding or rebound.  Musculoskeletal:        General: No swelling or tenderness. Normal range of motion.     Cervical back: Normal range of motion and neck supple.  Lymphadenopathy:     Head:     Right side of head: No preauricular, posterior auricular or occipital adenopathy.     Left side of head: No preauricular, posterior auricular or occipital adenopathy.     Cervical: No cervical adenopathy.     Upper Body:     Right upper body: No supraclavicular or axillary adenopathy.     Left upper body: No supraclavicular or axillary adenopathy.     Lower Body: No right inguinal adenopathy. No left inguinal adenopathy.  Skin:    General: Skin is warm and dry.  Neurological:     Mental Status: She is alert and oriented to person, place, and time.  Psychiatric:        Behavior: Behavior normal.        Thought Content: Thought content normal.        Judgment: Judgment normal.      No visits with results within 3 Day(s) from this visit.  Latest known visit with results is:  No results displayed because visit has over 200 results.      Assessment:  Cathy [name] is a 40 y.o. female ***  Symptomatically, ***  Plan: 1.    I discussed the assessment and treatment plan with the patient.  The patient was provided an opportunity to ask questions and all were answered.  The patient agreed with the plan and demonstrated an understanding of the instructions.  The patient was advised to call back if the symptoms worsen or if the condition fails to improve as anticipated.  I provided *** minutes of face-to-face time during this this encounter and > 50% was spent counseling as documented under my assessment and plan.    Melissa C. Mike Gip, MD, PhD    01/03/2021, 1:15 PM  I, Mirian Mo Tufford, am acting as Education administrator for [employer]. Mike Gip, MD, PhD.  {Add scribe attestation statement}

## 2021-01-07 ENCOUNTER — Inpatient Hospital Stay: Payer: Medicaid Other

## 2021-01-07 ENCOUNTER — Inpatient Hospital Stay: Payer: Medicaid Other | Admitting: Hematology and Oncology

## 2021-01-07 DIAGNOSIS — Z5111 Encounter for antineoplastic chemotherapy: Secondary | ICD-10-CM | POA: Insufficient documentation

## 2021-01-07 NOTE — Telephone Encounter (Signed)
01/07/2021 Left vm on both phone #'s in chart, reminding pt that her appts were pushed out by 1 week, to 01/14/21 @ 2 pm, due to her + COVID test on 12/23/20 per Dr. Hollie Salk and Dr. [name]. Left call back number in case there are any questions or issues with date/time SRW

## 2021-01-09 NOTE — Progress Notes (Signed)
[facility]  [address] [place], [place] [address] Phone: [phone]  Fax: [phone]   Clinic Day:  01/14/2021  Referring physician: Lequita Asal, MD  Chief Complaint: Cathy [name] is a 40 y.o. 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 Hollie Salk for assessment and management.   HPI: The patient was admitted to [facility] from 11/29/2020 - 12/14/2020 for acute renal failure. On admission, her hemoglobin was 6.5; she received 1 unit of PRBCs on 12/11/2020. Her creatinine was 6.59 and her potassium was 6.4. Her baseline creatinine is in the 2.5-3 range. Nephrology was consulted. She underwent hemodialysis placement on 11/30/2020.  Kidney biopsy on 12/03/2020 revealed diffuse lupus nephritis in an active and sclerosing phase with 60% cellular to vocal fibrocellular crescent formation, focal tuft fibrinoid necrosis, and focal segmental glomerular tuft sclerosis. 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  The patient was treated with high-dose IV Solu-Medrol and was then transitioned to prednisone. She received her first dose of Cytoxan on 12/08/2020.  The patient was admitted to [facility] from 12/23/2020 - 12/31/2020 with acute hypoxic respiratory failure secondary to pulmonary edema and COVID-19 pneumonia.  She presented with shortness of breath 2 days prior to admission which became acutely worse.  She was initially on a nonrebreather mask and then transitioned to nasal cannula.  She completed remdesivir and 4 days of IV steroids. She received IV Lasix and hemodialysis.  She was transiently on IV heparin.  VQ scan was negative.  She transitioned to room air at the time of discharge.  She was referred to hematology for Cytoxan 500 mg in 1000 mL of sodium chloride 0.9% IV to infuse over 120 minutes. She is to receive  treatment every 2 weeks x 2.5 months. Specifically, she is not to receive mesna or additional fluids.  She received a PRBC transfusion on 11/08/2020.  She was admitted to [facility] from 12/23/2020 - 12/31/2020 with COVID-19 pneumonia.  She presented with shortness of breath.  She was initially hypoxic requiring a non-rebreather mask then later transitioned to nasal cannula.  CXR was c/w fluid overload bibasilar atelectasis. She received remdesivir and IV steroids x 4 days.  She was treated with IV Lasix and hemodialysis. She was not discharged on oxygen.  Labs followed: 11/06/2020: Hematocrit 22.9, hemoglobin   7.5, MCV 87.7, platelets 147,000, WBC   7,700. Creatinine 2.62 (CrCl 24 ml/min). BUN 54. Calcium 7.1, albumin 1.6. total protein 5.0. 12/12/2020: Hematocrit 24.4, hemoglobin   8.2, MCV 90.0, platelets 118,000, WBC   7,400. Creatinine 4.90 (CrCl 11 ml/min). BUN 40. Calcium 7.2, albumin 2.3. 12/23/2020: Hematocrit 30.6, hemoglobin   9.5, MCV 91.9, platelets 141,000, WBC 12,800. 12/31/2020: Hematocrit 21.8, hemoglobin   7.2, MCV 89.7, platelets 211,000, WBC   9,100. Creatinine 7.25 (CrCl   7 ml/min). BUN 51. Calcium 7.2, albumin 2.4. Total protein 5.8.  The patient is currently on folic acid, vitamin 123456, and epo (?).  Symptomatically, she has been fair. Her breathing started to improve after she was discharged from the hospital on 12/31/2020 but about a week ago, her shortness of breath began worsening. She coughs up mucous every now and then. She has palpitations and chest pain when it gets difficult for her to breathe. She needs to be at 45 degrees to sleep and wakes up at night short of breath.  The patient also reports leg swelling, night sweats, runny  nose, diarrhea, and burning in her mouth. Her eyes twitch and she has double vision at times. She urinates throughout the day.  The patient denies fevers, headaches, sore throat, nausea, vomiting, reflux, bone or joint symptoms, skin  changes, numbness, weakness, balance or coordination problems, and bleeding of any kind.  The patient has bipolar depression. She reports that her mood fluctuates. Sometimes she is sad, sometimes she is angry. She states that "everything is new to her right now."  The patient has dialysis on Tuesdays, Thursdays, and Saturdays. In the beginning, dialysis helped her breathing, but it does not anymore. She loses 2-4 lbs with each dialysis treatment. She currently weighs in the 180s. In December 2021, she got up to 204 lbs. She is put on oxygen during dialysis. The fluid in her legs decreases after dialysis but if she does not prop her legs up immediately when she gets home, it comes back.  She is able to perform ADLs independently, though she notes that things take time.  She did not have any side effects from cycle #1 Cytoxan in the hospital.  She has an appointment at the [facility] on 01/23/2021.  She denies a family history of lupus or other autoimmune disease.   Past Medical History:  Diagnosis Date  . Anxiety   . Asthma   . Bipolar depression ([place])   . Depression   . Gonorrhea   . Lupus ([place])   . Pelvic inflammatory disease (PID)   . Renal hypertension   . Trichomonas infection     Past Surgical History:  Procedure Laterality Date  . IR FLUORO GUIDE CV LINE RIGHT  11/30/2020  . IR US GUIDE VASC ACCESS RIGHT  11/30/2020  . RENAL BIOPSY    . TUBAL LIGATION N/A 02/03/2019   Procedure: POST PARTUM TUBAL LIGATION;  Surgeon: Aletha Halim, MD;  Location: [facility] LD ORS;  Service: Gynecology;  Laterality: N/A;    Family History  Problem Relation Age of Onset  . Diabetes Maternal Grandmother   . Hypertension Maternal Grandmother   . Diabetes Maternal Grandfather   . Hypertension Maternal Grandfather   . Diabetes Paternal Grandmother   . Hypertension Paternal Grandmother   . Diabetes Paternal Grandfather   . Hypertension Paternal Grandfather     Social History:   reports that she quit smoking about 3 months ago. Her smoking use included cigarettes. She smoked 0.25 packs per day. She has never used smokeless tobacco. She reports previous alcohol use. She reports previous drug use. Drug: Marijuana. She denies alcohol and tobacco use. She denies exposure to radiation or toxins. She has a 38 year old, a 38 year old, and a 38 year old. She lives with her children. 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 The patient is alone today.  Allergies: No Known Allergies  Current Medications: Current Outpatient Medications  Medication Sig Dispense Refill  . albuterol (PROAIR HFA) 108 (90 Base) MCG/ACT inhaler Inhale 1-2 puffs into the lungs 2 (two) times daily. 1 each 2  . amLODipine (NORVASC) 10 MG tablet Take 1 tablet (10 mg total) by mouth daily. 30 tablet 2  . cloNIDine (CATAPRES) 0.3 MG tablet Take 1 tablet (0.3 mg total) by mouth 3 (three) times daily. 90 tablet 2  . cyanocobalamin 100 MCG tablet Take 1 tablet (100 mcg total) by mouth daily. 30 tablet 1  . fenofibrate 160 MG tablet Take 1 tablet (160 mg total) by mouth daily. 30 tablet 1  . folic  acid (FOLVITE) 1 MG tablet Take 1 tablet (1 mg total) by mouth daily. 30 tablet 1  . guaiFENesin (MUCINEX) 600 MG 12 hr tablet Take 1 tablet (600 mg total) by mouth 2 (two) times daily. 30 tablet 0  . ipratropium (ATROVENT HFA) 17 MCG/ACT inhaler Inhale 2 puffs into the lungs every 6 (six) hours. 1 each 12  . labetalol (NORMODYNE) 200 MG tablet Take 2 tablets (400 mg total) by mouth 2 (two) times daily. 60 tablet 3  . pantoprazole (PROTONIX) 40 MG tablet Take 1 tablet (40 mg total) by mouth daily. 30 tablet 1  . predniSONE (DELTASONE) 20 MG tablet Take 2 tablets (40 mg total) by mouth daily with breakfast. 60 tablet 3  . torsemide (DEMADEX) 20 MG tablet Take 3 tablets (60 mg total) by mouth 2 (two) times daily. 60 tablet 2  . Vitamin D3 (VITAMIN D) 25 MCG tablet Take 1 tablet (1,000 Units total) by mouth daily. 30  tablet 0  . hydrocortisone cream 1 % Apply topically 2 (two) times daily. (Patient not taking: Reported on 01/14/2021) 30 g 0   No current facility-administered medications for this visit.    Review of Systems  Constitutional: Positive for diaphoresis (night sweats) and weight loss (due to fluid loss). Negative for chills, fever and malaise/fatigue.  HENT: Negative for congestion, ear discharge, ear pain, hearing loss, nosebleeds, sinus pain, sore throat and tinnitus.        Burning in her mouth. Runny nose.  Eyes: Positive for double vision. Negative for blurred vision.       Eyes twitch  Respiratory: Positive for cough, sputum production and shortness of breath. Negative for hemoptysis.   Cardiovascular: Positive for chest pain, palpitations, orthopnea (sleeps at 45 degrees) and leg swelling.  Gastrointestinal: Positive for diarrhea. Negative for abdominal pain, blood in stool, constipation, heartburn, melena, nausea and vomiting.  Genitourinary: Negative for dysuria, frequency, hematuria and urgency.       On dialysis.  Musculoskeletal: Negative for back pain, joint pain, myalgias and neck pain.  Skin: Negative for itching and rash.  Neurological: Negative for dizziness, tingling, sensory change, weakness and headaches.  Endo/Heme/Allergies: Does not bruise/bleed easily.  Psychiatric/Behavioral: Positive for depression. Negative for memory loss. The patient is not nervous/anxious and does not have insomnia.   All other systems reviewed and are negative.  Performance status (ECOG): 2  Vitals Blood pressure (!) 172/112, pulse 76, resp. rate (!) 22, SpO2 100 %, unknown if currently breastfeeding.   Physical Exam Vitals and nursing note reviewed.  Constitutional:      General: She is not in acute distress.    Appearance: She is not diaphoretic.  HENT:     Head: Normocephalic and atraumatic.     Mouth/Throat:     Mouth: Mucous membranes are moist.     Pharynx: Oropharynx is clear.      Comments: No thrush. Eyes:     General: No scleral icterus.    Extraocular Movements: Extraocular movements intact.     Conjunctiva/sclera: Conjunctivae normal.     Pupils: Pupils are equal, round, and reactive to light.  Cardiovascular:     Rate and Rhythm: Normal rate and regular rhythm.     Heart sounds: Normal heart sounds. No murmur heard.   Pulmonary:     Effort: No respiratory distress.     Comments: Fine crackles at lung bases. Decreased breath sounds, especially on the left side. Chest:     Chest wall: No tenderness.  Breasts:  Right: No axillary adenopathy or supraclavicular adenopathy.     Left: No axillary adenopathy or supraclavicular adenopathy.    Abdominal:     General: Bowel sounds are normal. There is no distension.     Palpations: Abdomen is soft. There is no mass.     Tenderness: There is no abdominal tenderness. There is no guarding or rebound.  Musculoskeletal:        General: No swelling or tenderness. Normal range of motion.     Cervical back: Normal range of motion and neck supple.     Right lower leg: Edema (3-4+, up to thighs) present.     Left lower leg: Edema (3-4+, up to thighs) present.  Lymphadenopathy:     Head:     Right side of head: No preauricular, posterior auricular or occipital adenopathy.     Left side of head: No preauricular, posterior auricular or occipital adenopathy.     Cervical: No cervical adenopathy.     Upper Body:     Right upper body: No supraclavicular or axillary adenopathy.     Left upper body: No supraclavicular or axillary adenopathy.     Lower Body: No right inguinal adenopathy. No left inguinal adenopathy.  Skin:    General: Skin is warm and dry.     Comments: Dialysis catheter on right side of chest, unremarkable  Neurological:     Mental Status: She is alert and oriented to person, place, and time.  Psychiatric:        Behavior: Behavior normal.        Thought Content: Thought content normal.         Judgment: Judgment normal.     Appointment on 01/14/2021  Component Date Value Ref Range Status  . Sodium 01/14/2021 141  135 - 145 mmol/L Final  . Potassium 01/14/2021 5.0  3.5 - 5.1 mmol/L Final  . Chloride 01/14/2021 105  98 - 111 mmol/L Final  . CO2 01/14/2021 19* 22 - 32 mmol/L Final  . Glucose, Bld 01/14/2021 179* 70 - 99 mg/dL Final   Glucose reference range applies only to samples taken after fasting for at least 8 hours.  . BUN 01/14/2021 77* 6 - 20 mg/dL Final  . Creatinine, Ser 01/14/2021 6.98* 0.44 - 1.00 mg/dL Final  . Calcium 01/14/2021 6.8* 8.9 - 10.3 mg/dL Final  . Total Protein 01/14/2021 6.0* 6.5 - 8.1 g/dL Final  . Albumin 01/14/2021 2.6* 3.5 - 5.0 g/dL Final  . AST 01/14/2021 22  15 - 41 U/L Final  . ALT 01/14/2021 34  0 - 44 U/L Final  . Alkaline Phosphatase 01/14/2021 97  38 - 126 U/L Final  . Total Bilirubin 01/14/2021 0.5  0.3 - 1.2 mg/dL Final  . GFR, Estimated 01/14/2021 7* >60 mL/min Final   Comment: (NOTE) Calculated using the CKD-EPI Creatinine Equation (2021)   . Anion gap 01/14/2021 17* 5 - 15 Final   Performed at [facility], [address], [place], [place] [address]  . WBC 01/14/2021 7.0  4.0 - 10.5 K/uL Final  . RBC 01/14/2021 2.67* 3.87 - 5.11 MIL/uL Final  . Hemoglobin 01/14/2021 7.8* 12.0 - 15.0 g/dL Final  . HCT 01/14/2021 24.6* 36.0 - 46.0 % Final  . MCV 01/14/2021 92.1  80.0 - 100.0 fL Final  . MCH 01/14/2021 29.2  26.0 - 34.0 pg Final  . MCHC 01/14/2021 31.7  30.0 - 36.0 g/dL Final  . RDW 01/14/2021 14.8  11.5 - 15.5 % Final  .  Platelets 01/14/2021 152  150 - 400 K/uL Final  . nRBC 01/14/2021 0.0  0.0 - 0.2 % Final  . Neutrophils Relative % 01/14/2021 89  % Final  . Neutro Abs 01/14/2021 6.2  1.7 - 7.7 K/uL Final  . Lymphocytes Relative 01/14/2021 8  % Final  . Lymphs Abs 01/14/2021 0.6* 0.7 - 4.0 K/uL Final  . Monocytes Relative 01/14/2021 2  % Final  . Monocytes Absolute 01/14/2021 0.1  0.1 - 1.0 K/uL Final  .  Eosinophils Relative 01/14/2021 0  % Final  . Eosinophils Absolute 01/14/2021 0.0  0.0 - 0.5 K/uL Final  . Basophils Relative 01/14/2021 0  % Final  . Basophils Absolute 01/14/2021 0.0  0.0 - 0.1 K/uL Final  . Immature Granulocytes 01/14/2021 1  % Final  . Abs Immature Granulocytes 01/14/2021 0.05  0.00 - 0.07 K/uL Final   Performed at [facility], [address], [place], [place] [address]    Assessment:  OCEA BROTMAN is a 40 y.o. female with lupus nephritis and stage IIIa chronic kidney disease.   She was admitted to [facility] from 11/29/2020 - 12/14/2020 for acute renal failure. Creatinine was 6.59 and her potassium was 6.4. Her baseline creatinine was in the 2.5-3 range. She underwent hemodialysis catheter placement on 11/30/2020.  Kidney biopsy on 12/03/2020 revealed diffuse lupus nephritis in an active and sclerosing phase with 60% cellular to vocal fibrocellular crescent formation, focal tuft fibrinoid necrosis, and focal segmental glomerular tuft sclerosis. 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  She was treated with high-dose IV Solu-Medrol and was then transitioned to prednisone. She received her first dose of Cytoxan on 12/08/2020.  She was admitted to [facility] from 12/23/2020 - 12/31/2020 with COVID-19 pneumonia. She completed 4 days of Remdesivir and IV steroids. She was then switched to oral prednisone. Her D-dimers were elevated; lower extremity venous duplex was negative.  She was transiently on heparin.  Symptomatically, she is short of breath.  BP is 172/112.  She is volume overloaded.  Plan: 1.   Labs today:  CBC with diff, CMP. 2.   Lupus nephritis  She received cycle #1 Cytoxan in the hospital on 12/08/2020.  Review plan for Cytoxan 500 mg IV every 14 days x 5 (total 6).  Discuss potential side effects of Cytoxan.    Patient consented to treatment.   3.   No chemotherapy today. 4.   RN:  Please  assist with request for oxygen. 5.   Patient will be rescheduled for MD assess, labs, and Cytoxan per Dr Hollie Salk.  Addendum:  I spoke to Dr. Hollie Salk over the phone. She agrees with my recommendation for the patient to go to the [facility] ER. She is reluctant to go to the hospital because she states that "they are booked in terms of dialysis. She could go 3-4 days without getting dialysis because they do not have room". She is also worried about her children at home and does not want to reach out to family for help because "they have lives too." She agrees to sign AMA papers. She will go to dialysis as scheduled tomorrow morning.  I discussed the assessment and treatment plan with the patient.  The patient was provided an opportunity to ask questions and all were answered.  The patient agreed with the plan and demonstrated an understanding of the instructions.  The patient was advised to call back if the symptoms worsen or if the condition fails  to improve as anticipated.  I provided 20 minutes of face-to-face time during this this encounter and > 50% was spent counseling as documented under my assessment and plan.  An additional 25 minutes were spent reviewing her chart (Epic and [facility]) including notes, labs, and imaging studies, writing chemotherapy and speaking to Dr Hollie Salk 3 times since initial referral.   Robt Okuda C. Mike Gip, MD, PhD    01/14/2021, 3:11 PM   I, De Burrs, am acting as scribe for [employer]. Mike Gip, MD, PhD.  I, Johannah Rozas C. Mike Gip, MD, have reviewed the above documentation for accuracy and completeness, and I agree with the above.

## 2021-01-11 ENCOUNTER — Telehealth: Payer: Self-pay | Admitting: General Practice

## 2021-01-11 NOTE — Telephone Encounter (Signed)
   Cathy [name] DOB: [birth year] MRN: [id number]   RIDER WAIVER AND RELEASE OF LIABILITY  For purposes of improving physical access to our facilities, [facility] is pleased to partner with third parties to provide [facility] patients or other authorized individuals the option of convenient, on-demand ground transportation services (the [place]") through use of the technology service that enables users to request on-demand ground transportation from independent third-party providers.  By opting to use and accept these [employer], I, the undersigned, hereby agree on behalf of myself, and on behalf of any minor child using the [employer] for whom I am the parent or legal guardian, as follows:  1. Government social research officer provided to me are provided by independent third-party transportation providers who are not [employer] or employees and who are unaffiliated with [employer]. 2. Cathy [name] is neither a transportation carrier nor a common or public carrier. 3. [facility] has no control over the quality or safety of the transportation that occurs as a result of the [employer]. 4. [facility] cannot guarantee that any third-party transportation provider will complete any arranged transportation service. 5. [facility] makes no representation, warranty, or guarantee regarding the reliability, timeliness, quality, safety, suitability, or availability of any of the Transport Services or that they will be error free. 6. I fully understand that traveling by vehicle involves risks and dangers of serious bodily injury, including permanent disability, paralysis, and death. I agree, on behalf of myself and on behalf of any minor child using the Transport Services for whom I am the parent or legal guardian, that the entire risk arising out of my use of the [employer] remains solely with me, to the maximum extent permitted under applicable law. 7. The [employer] are provided "as is" and "as available." Cathy [name] disclaims all representations and warranties, express, implied or statutory, not expressly set out in these terms, including the implied warranties of merchantability and fitness for a particular purpose. 8. I hereby waive and release [facility], its agents, employees, officers, directors, representatives, insurers, attorneys, assigns, successors, subsidiaries, and affiliates from any and all past, present, or future claims, demands, liabilities, actions, causes of action, or suits of any kind directly or indirectly arising from acceptance and use of the [employer]. 9. I further waive and release [facility] and its affiliates from all present and future liability and responsibility for any injury or death to persons or damages to property caused by or related to the use of the [employer]. 10. I have read this Waiver and Release of Liability, and I understand the terms used in it and their legal significance. This Waiver is freely and voluntarily given with the understanding that my right (as well as the right of any minor child for whom I am the parent or legal guardian using the [employer]) to legal recourse against [facility] in connection with the [employer] is knowingly surrendered in return for use of these services.   I attest that I read the consent document to Cathy [name], gave Cathy [name] the opportunity to ask questions and answered the questions asked (if any). I affirm that Cathy [name] then provided consent for she's participation in this program.     Cathy [name]

## 2021-01-14 ENCOUNTER — Encounter: Payer: Self-pay | Admitting: Hematology and Oncology

## 2021-01-14 ENCOUNTER — Inpatient Hospital Stay: Payer: Medicaid Other | Attending: Hematology and Oncology

## 2021-01-14 ENCOUNTER — Inpatient Hospital Stay: Payer: Medicaid Other

## 2021-01-14 ENCOUNTER — Inpatient Hospital Stay (HOSPITAL_BASED_OUTPATIENT_CLINIC_OR_DEPARTMENT_OTHER): Payer: Medicaid Other | Admitting: Hematology and Oncology

## 2021-01-14 ENCOUNTER — Other Ambulatory Visit: Payer: Self-pay

## 2021-01-14 VITALS — BP 172/112 | HR 76 | Resp 22

## 2021-01-14 DIAGNOSIS — M3214 Glomerular disease in systemic lupus erythematosus: Secondary | ICD-10-CM | POA: Insufficient documentation

## 2021-01-14 DIAGNOSIS — Z992 Dependence on renal dialysis: Secondary | ICD-10-CM | POA: Diagnosis not present

## 2021-01-14 DIAGNOSIS — N1831 Chronic kidney disease, stage 3a: Secondary | ICD-10-CM | POA: Insufficient documentation

## 2021-01-14 LAB — COMPREHENSIVE METABOLIC PANEL
ALT: 34 U/L (ref 0–44)
AST: 22 U/L (ref 15–41)
Albumin: 2.6 g/dL — ABNORMAL LOW (ref 3.5–5.0)
Alkaline Phosphatase: 97 U/L (ref 38–126)
Anion gap: 17 — ABNORMAL HIGH (ref 5–15)
BUN: 77 mg/dL — ABNORMAL HIGH (ref 6–20)
CO2: 19 mmol/L — ABNORMAL LOW (ref 22–32)
Calcium: 6.8 mg/dL — ABNORMAL LOW (ref 8.9–10.3)
Chloride: 105 mmol/L (ref 98–111)
Creatinine, Ser: 6.98 mg/dL — ABNORMAL HIGH (ref 0.44–1.00)
GFR, Estimated: 7 mL/min — ABNORMAL LOW (ref >60)
Glucose, Bld: 179 mg/dL — ABNORMAL HIGH (ref 70–99)
Potassium: 5 mmol/L (ref 3.5–5.1)
Sodium: 141 mmol/L (ref 135–145)
Total Bilirubin: 0.5 mg/dL (ref 0.3–1.2)
Total Protein: 6 g/dL — ABNORMAL LOW (ref 6.5–8.1)

## 2021-01-14 LAB — CBC WITH DIFFERENTIAL/PLATELET
Abs Immature Granulocytes: 0.05 10*3/uL (ref 0.00–0.07)
Basophils Absolute: 0 10*3/uL (ref 0.0–0.1)
Basophils Relative: 0 %
Eosinophils Absolute: 0 10*3/uL (ref 0.0–0.5)
Eosinophils Relative: 0 %
HCT: 24.6 % — ABNORMAL LOW (ref 36.0–46.0)
Hemoglobin: 7.8 g/dL — ABNORMAL LOW (ref 12.0–15.0)
Immature Granulocytes: 1 %
Lymphocytes Relative: 8 %
Lymphs Abs: 0.6 10*3/uL — ABNORMAL LOW (ref 0.7–4.0)
MCH: 29.2 pg (ref 26.0–34.0)
MCHC: 31.7 g/dL (ref 30.0–36.0)
MCV: 92.1 fL (ref 80.0–100.0)
Monocytes Absolute: 0.1 10*3/uL (ref 0.1–1.0)
Monocytes Relative: 2 %
Neutro Abs: 6.2 10*3/uL (ref 1.7–7.7)
Neutrophils Relative %: 89 %
Platelets: 152 10*3/uL (ref 150–400)
RBC: 2.67 MIL/uL — ABNORMAL LOW (ref 3.87–5.11)
RDW: 14.8 % (ref 11.5–15.5)
WBC: 7 10*3/uL (ref 4.0–10.5)
nRBC: 0 % (ref 0.0–0.2)

## 2021-01-14 NOTE — Progress Notes (Signed)
Patient is complaining of SOB that is constant and patient stated that she gets nauseated after her dialysis. Patient is complaining soaness and redness inside her mouth

## 2021-01-14 NOTE — Progress Notes (Signed)
Patient O2 sat has dropped to 86% on room air at rest in our Oncology clinic today and 100% on 2L, she is extreme short of breath. Patient is not on O2 at home. Order in for at home oxygen. Also spoke to [employer] who agrees to set up o2 at home for patient. MD aware.  Patient also refuses to go to ER as advised by MD. Against medical advice refusal form signed and scanned in chart. Patient d/c in uber. Patient educated and verbalizes understanding.

## 2021-01-15 DIAGNOSIS — J9690 Respiratory failure, unspecified, unspecified whether with hypoxia or hypercapnia: Secondary | ICD-10-CM | POA: Diagnosis not present

## 2021-01-15 DIAGNOSIS — I509 Heart failure, unspecified: Secondary | ICD-10-CM | POA: Diagnosis not present

## 2021-01-15 DIAGNOSIS — U071 COVID-19: Secondary | ICD-10-CM | POA: Diagnosis not present

## 2021-01-18 ENCOUNTER — Emergency Department (HOSPITAL_COMMUNITY): Payer: Medicaid Other

## 2021-01-18 ENCOUNTER — Inpatient Hospital Stay (HOSPITAL_COMMUNITY)
Admission: EM | Admit: 2021-01-18 | Discharge: 2021-01-20 | DRG: 189 | Disposition: A | Discharge status: Home / Self Care | Payer: Medicaid Other | Attending: Family Medicine | Admitting: Family Medicine

## 2021-01-18 ENCOUNTER — Encounter (HOSPITAL_COMMUNITY): Payer: Self-pay

## 2021-01-18 ENCOUNTER — Other Ambulatory Visit: Payer: Self-pay

## 2021-01-18 DIAGNOSIS — Z7952 Long term (current) use of systemic steroids: Secondary | ICD-10-CM

## 2021-01-18 DIAGNOSIS — N179 Acute kidney failure, unspecified: Secondary | ICD-10-CM | POA: Diagnosis present

## 2021-01-18 DIAGNOSIS — J9601 Acute respiratory failure with hypoxia: Secondary | ICD-10-CM | POA: Diagnosis not present

## 2021-01-18 DIAGNOSIS — D631 Anemia in chronic kidney disease: Secondary | ICD-10-CM | POA: Diagnosis present

## 2021-01-18 DIAGNOSIS — R0602 Shortness of breath: Secondary | ICD-10-CM | POA: Diagnosis not present

## 2021-01-18 DIAGNOSIS — J90 Pleural effusion, not elsewhere classified: Secondary | ICD-10-CM | POA: Diagnosis not present

## 2021-01-18 DIAGNOSIS — Z9851 Tubal ligation status: Secondary | ICD-10-CM

## 2021-01-18 DIAGNOSIS — Z79899 Other long term (current) drug therapy: Secondary | ICD-10-CM

## 2021-01-18 DIAGNOSIS — J9690 Respiratory failure, unspecified, unspecified whether with hypoxia or hypercapnia: Secondary | ICD-10-CM | POA: Diagnosis present

## 2021-01-18 DIAGNOSIS — Z87891 Personal history of nicotine dependence: Secondary | ICD-10-CM

## 2021-01-18 DIAGNOSIS — I120 Hypertensive chronic kidney disease with stage 5 chronic kidney disease or end stage renal disease: Secondary | ICD-10-CM | POA: Diagnosis present

## 2021-01-18 DIAGNOSIS — I169 Hypertensive crisis, unspecified: Secondary | ICD-10-CM | POA: Diagnosis not present

## 2021-01-18 DIAGNOSIS — N2581 Secondary hyperparathyroidism of renal origin: Secondary | ICD-10-CM | POA: Diagnosis present

## 2021-01-18 DIAGNOSIS — J810 Acute pulmonary edema: Principal | ICD-10-CM | POA: Diagnosis present

## 2021-01-18 DIAGNOSIS — Z91148 Patient's other noncompliance with medication regimen for other reason: Secondary | ICD-10-CM

## 2021-01-18 DIAGNOSIS — F419 Anxiety disorder, unspecified: Secondary | ICD-10-CM | POA: Diagnosis present

## 2021-01-18 DIAGNOSIS — Z8249 Family history of ischemic heart disease and other diseases of the circulatory system: Secondary | ICD-10-CM

## 2021-01-18 DIAGNOSIS — L89159 Pressure ulcer of sacral region, unspecified stage: Secondary | ICD-10-CM | POA: Diagnosis present

## 2021-01-18 DIAGNOSIS — I161 Hypertensive emergency: Secondary | ICD-10-CM | POA: Diagnosis present

## 2021-01-18 DIAGNOSIS — Z9114 Patient's other noncompliance with medication regimen: Secondary | ICD-10-CM

## 2021-01-18 DIAGNOSIS — E8770 Fluid overload, unspecified: Secondary | ICD-10-CM | POA: Diagnosis present

## 2021-01-18 DIAGNOSIS — F3130 Bipolar disorder, current episode depressed, mild or moderate severity, unspecified: Secondary | ICD-10-CM | POA: Diagnosis present

## 2021-01-18 DIAGNOSIS — M3214 Glomerular disease in systemic lupus erythematosus: Secondary | ICD-10-CM | POA: Diagnosis present

## 2021-01-18 DIAGNOSIS — J45909 Unspecified asthma, uncomplicated: Secondary | ICD-10-CM | POA: Diagnosis present

## 2021-01-18 DIAGNOSIS — J811 Chronic pulmonary edema: Secondary | ICD-10-CM | POA: Diagnosis present

## 2021-01-18 DIAGNOSIS — Z20822 Contact with and (suspected) exposure to covid-19: Secondary | ICD-10-CM | POA: Diagnosis present

## 2021-01-18 DIAGNOSIS — N186 End stage renal disease: Secondary | ICD-10-CM | POA: Diagnosis present

## 2021-01-18 DIAGNOSIS — Z992 Dependence on renal dialysis: Secondary | ICD-10-CM

## 2021-01-18 LAB — I-STAT ARTERIAL BLOOD GAS, ED
Acid-base deficit: 1 mmol/L (ref 0.0–2.0)
Bicarbonate: 24.4 mmol/L (ref 20.0–28.0)
Calcium, Ion: 0.97 mmol/L — ABNORMAL LOW (ref 1.15–1.40)
HCT: 23 % — ABNORMAL LOW (ref 36.0–46.0)
Hemoglobin: 7.8 g/dL — ABNORMAL LOW (ref 12.0–15.0)
O2 Saturation: 99 %
Patient temperature: 98.6
Potassium: 4.5 mmol/L (ref 3.5–5.1)
Sodium: 139 mmol/L (ref 135–145)
TCO2: 26 mmol/L (ref 22–32)
pCO2 arterial: 45.7 mmHg (ref 32.0–48.0)
pH, Arterial: 7.335 — ABNORMAL LOW (ref 7.350–7.450)
pO2, Arterial: 150 mmHg — ABNORMAL HIGH (ref 83.0–108.0)

## 2021-01-18 LAB — RESP PANEL BY RT-PCR (FLU A&B, COVID) ARPGX2
Influenza A by PCR: NEGATIVE
Influenza B by PCR: NEGATIVE
SARS Coronavirus 2 by RT PCR: NEGATIVE

## 2021-01-18 LAB — COMPREHENSIVE METABOLIC PANEL
ALT: 109 U/L — ABNORMAL HIGH (ref 0–44)
AST: 151 U/L — ABNORMAL HIGH (ref 15–41)
Albumin: 2.5 g/dL — ABNORMAL LOW (ref 3.5–5.0)
Alkaline Phosphatase: 187 U/L — ABNORMAL HIGH (ref 38–126)
Anion gap: 14 (ref 5–15)
BUN: 59 mg/dL — ABNORMAL HIGH (ref 6–20)
CO2: 22 mmol/L (ref 22–32)
Calcium: 7 mg/dL — ABNORMAL LOW (ref 8.9–10.3)
Chloride: 105 mmol/L (ref 98–111)
Creatinine, Ser: 6.03 mg/dL — ABNORMAL HIGH (ref 0.44–1.00)
GFR, Estimated: 9 mL/min — ABNORMAL LOW (ref >60)
Glucose, Bld: 154 mg/dL — ABNORMAL HIGH (ref 70–99)
Potassium: 4.5 mmol/L (ref 3.5–5.1)
Sodium: 141 mmol/L (ref 135–145)
Total Bilirubin: 0.8 mg/dL (ref 0.3–1.2)
Total Protein: 5.7 g/dL — ABNORMAL LOW (ref 6.5–8.1)

## 2021-01-18 LAB — CBC WITH DIFFERENTIAL/PLATELET
Abs Immature Granulocytes: 0.22 10*3/uL — ABNORMAL HIGH (ref 0.00–0.07)
Basophils Absolute: 0 10*3/uL (ref 0.0–0.1)
Basophils Relative: 0 %
Eosinophils Absolute: 0 10*3/uL (ref 0.0–0.5)
Eosinophils Relative: 0 %
HCT: 27.8 % — ABNORMAL LOW (ref 36.0–46.0)
Hemoglobin: 8.4 g/dL — ABNORMAL LOW (ref 12.0–15.0)
Immature Granulocytes: 2 %
Lymphocytes Relative: 27 %
Lymphs Abs: 3.8 10*3/uL (ref 0.7–4.0)
MCH: 28.9 pg (ref 26.0–34.0)
MCHC: 30.2 g/dL (ref 30.0–36.0)
MCV: 95.5 fL (ref 80.0–100.0)
Monocytes Absolute: 0.8 10*3/uL (ref 0.1–1.0)
Monocytes Relative: 6 %
Neutro Abs: 9.1 10*3/uL — ABNORMAL HIGH (ref 1.7–7.7)
Neutrophils Relative %: 65 %
Platelets: 201 10*3/uL (ref 150–400)
RBC: 2.91 MIL/uL — ABNORMAL LOW (ref 3.87–5.11)
RDW: 14.8 % (ref 11.5–15.5)
WBC: 14 10*3/uL — ABNORMAL HIGH (ref 4.0–10.5)
nRBC: 0.1 % (ref 0.0–0.2)

## 2021-01-18 LAB — I-STAT VENOUS BLOOD GAS, ED
Acid-base deficit: 3 mmol/L — ABNORMAL HIGH (ref 0.0–2.0)
Bicarbonate: 25 mmol/L (ref 20.0–28.0)
Calcium, Ion: 0.93 mmol/L — ABNORMAL LOW (ref 1.15–1.40)
HCT: 25 % — ABNORMAL LOW (ref 36.0–46.0)
Hemoglobin: 8.5 g/dL — ABNORMAL LOW (ref 12.0–15.0)
O2 Saturation: 72 %
Potassium: 4.6 mmol/L (ref 3.5–5.1)
Sodium: 139 mmol/L (ref 135–145)
TCO2: 27 mmol/L (ref 22–32)
pCO2, Ven: 58.2 mmHg (ref 44.0–60.0)
pH, Ven: 7.241 — ABNORMAL LOW (ref 7.250–7.430)
pO2, Ven: 45 mmHg (ref 32.0–45.0)

## 2021-01-18 LAB — BRAIN NATRIURETIC PEPTIDE: B Natriuretic Peptide: >4500 pg/mL — ABNORMAL HIGH (ref 0.0–100.0)

## 2021-01-18 LAB — TROPONIN I (HIGH SENSITIVITY): Troponin I (High Sensitivity): 59 ng/L — ABNORMAL HIGH (ref <18)

## 2021-01-18 MED ORDER — TORSEMIDE 20 MG PO TABS
60.0000 mg | ORAL_TABLET | Freq: Two times a day (BID) | ORAL | Status: DC
  Administered 2021-01-19 – 2021-01-20 (×3): 60 mg via ORAL
  Filled 2021-01-18 (×4): qty 3

## 2021-01-18 MED ORDER — PREDNISONE 20 MG PO TABS
40.0000 mg | ORAL_TABLET | Freq: Every day | ORAL | Status: DC
  Administered 2021-01-19 – 2021-01-20 (×2): 40 mg via ORAL
  Filled 2021-01-18 (×2): qty 2

## 2021-01-18 MED ORDER — LABETALOL HCL 200 MG PO TABS: 400.0000 mg | ORAL_TABLET | Freq: Two times a day (BID) | ORAL | Status: DC

## 2021-01-18 MED ORDER — LABETALOL HCL 5 MG/ML IV SOLN: 20.0000 mg | Freq: Once | INTRAVENOUS | Status: DC

## 2021-01-18 MED ORDER — ALBUTEROL SULFATE (2.5 MG/3ML) 0.083% IN NEBU
5.0000 mg | INHALATION_SOLUTION | Freq: Once | RESPIRATORY_TRACT | Status: AC
  Administered 2021-01-18: 5 mg via RESPIRATORY_TRACT

## 2021-01-18 MED ORDER — CLONIDINE HCL 0.2 MG PO TABS
0.3000 mg | ORAL_TABLET | Freq: Three times a day (TID) | ORAL | Status: DC
  Administered 2021-01-19 (×2): 0.3 mg via ORAL
  Filled 2021-01-18 (×2): qty 1

## 2021-01-18 MED ORDER — NITROGLYCERIN IN D5W 200-5 MCG/ML-% IV SOLN
INTRAVENOUS | Status: AC
  Administered 2021-01-18: 100 ug/min via INTRAVENOUS
  Filled 2021-01-18: qty 250

## 2021-01-18 MED ORDER — LABETALOL HCL 5 MG/ML IV SOLN: 10.0000 mg | Freq: Once | INTRAVENOUS | Status: DC

## 2021-01-18 MED ORDER — FOLIC ACID 1 MG PO TABS
1.0000 mg | ORAL_TABLET | Freq: Every day | ORAL | Status: DC
  Administered 2021-01-19 – 2021-01-20 (×2): 1 mg via ORAL
  Filled 2021-01-18 (×2): qty 1

## 2021-01-18 MED ORDER — HYDRALAZINE HCL 20 MG/ML IJ SOLN
20.0000 mg | Freq: Once | INTRAMUSCULAR | Status: AC
  Administered 2021-01-18: 20 mg via INTRAVENOUS
  Filled 2021-01-18: qty 1

## 2021-01-18 MED ORDER — LORAZEPAM 2 MG/ML IJ SOLN
0.5000 mg | Freq: Once | INTRAMUSCULAR | Status: AC
  Administered 2021-01-18: 0.5 mg via INTRAVENOUS
  Filled 2021-01-18: qty 1

## 2021-01-18 MED ORDER — IPRATROPIUM BROMIDE HFA 17 MCG/ACT IN AERS
2.0000 | INHALATION_SPRAY | Freq: Four times a day (QID) | RESPIRATORY_TRACT | Status: DC
  Administered 2021-01-19: 2 via RESPIRATORY_TRACT
  Filled 2021-01-18: qty 12.9

## 2021-01-18 MED ORDER — VITAMIN B-12 100 MCG PO TABS
100.0000 ug | ORAL_TABLET | Freq: Every day | ORAL | Status: DC
  Administered 2021-01-19 – 2021-01-20 (×2): 100 ug via ORAL
  Filled 2021-01-18 (×2): qty 1

## 2021-01-18 MED ORDER — ALBUTEROL SULFATE HFA 108 (90 BASE) MCG/ACT IN AERS
1.0000 | INHALATION_SPRAY | Freq: Two times a day (BID) | RESPIRATORY_TRACT | Status: DC
  Administered 2021-01-20: 2 via RESPIRATORY_TRACT
  Filled 2021-01-18: qty 6.7

## 2021-01-18 MED ORDER — AMLODIPINE BESYLATE 5 MG PO TABS: 10.0000 mg | ORAL_TABLET | Freq: Every day | ORAL | Status: DC

## 2021-01-18 MED ORDER — NITROGLYCERIN IN D5W 200-5 MCG/ML-% IV SOLN
0.0000 ug/min | INTRAVENOUS | Status: DC
  Administered 2021-01-19 (×2): 175 ug/min via INTRAVENOUS
  Filled 2021-01-18 (×6): qty 250

## 2021-01-18 MED ORDER — MAGNESIUM SULFATE 2 GM/50ML IV SOLN
2.0000 g | Freq: Once | INTRAVENOUS | Status: AC
  Administered 2021-01-18: 2 g via INTRAVENOUS
  Filled 2021-01-18: qty 50

## 2021-01-18 MED ORDER — PANTOPRAZOLE SODIUM 40 MG PO TBEC
40.0000 mg | DELAYED_RELEASE_TABLET | Freq: Every day | ORAL | Status: DC
  Administered 2021-01-19 – 2021-01-20 (×2): 40 mg via ORAL
  Filled 2021-01-18 (×2): qty 1

## 2021-01-18 MED ORDER — FENOFIBRATE 160 MG PO TABS
160.0000 mg | ORAL_TABLET | Freq: Every day | ORAL | Status: DC
  Administered 2021-01-19 – 2021-01-20 (×2): 160 mg via ORAL
  Filled 2021-01-18 (×2): qty 1

## 2021-01-18 NOTE — ED Provider Notes (Signed)
[name] Provider Note   CSN: [id number] Arrival date & time: 01/18/21  1924     History Chief Complaint  Patient presents with  . Respiratory Distress    Cathy [name] is a 40 y.o. 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 Acute onset SOB while in the car just prior to arrival with wheezing and cough. EMS called to scene and found patient in respiratory distress satting mid-80s on RA. Sats improved after placed on NRB. '125mg'$  Solumedrol, albuterol, and Duoneb given prior to arrival. SBP noted to be 200s; 2 SLN given as well. Upon arrival, patient states her breathing has not improved. Denies fever, chills, chest pain, abdominal pain, N/V/D. Last dialysis session yesterday.  The history is provided by the patient, the EMS personnel and medical records. The history is limited by the condition of the patient.  Shortness of Breath Severity:  Severe Onset quality:  Sudden Duration: just prior to arrival. Timing:  Constant Progression:  Unchanged Chronicity:  New Context: smoke exposure   Relieved by:  Nothing Worsened by:  Coughing Ineffective treatments:  Oxygen and inhaler Associated symptoms: cough, diaphoresis and wheezing   Associated symptoms: no abdominal pain, no chest pain, no ear pain, no fever, no hemoptysis, no rash, no sore throat, no sputum production and no vomiting   Risk factors: tobacco use   Risk factors comment:  Asthma      Past Medical History:  Diagnosis Date  . Anxiety   . Asthma   . Bipolar depression ([place])   . Depression   . Gonorrhea   . Lupus ([facility])   . Pelvic inflammatory disease (PID)   . Renal hypertension   . Trichomonas infection     Patient Active Problem List   Diagnosis Date Noted  . Encounter for antineoplastic chemotherapy 01/07/2021  . Respiratory failure ([place]) 12/24/2020  . Flash pulmonary edema  ([place]) 12/23/2020  . Hypertensive emergency 12/23/2020  . CHF exacerbation ([place]) 12/23/2020  . ESRD (end stage renal disease) ([place]) 12/23/2020  . Elevated troponin 12/23/2020  . COVID-19 virus infection 12/23/2020  . 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 Volume overload 11/29/2020  . Hyperkalemia 11/29/2020  . Pericardial effusion   . Aortic atherosclerosis ([place]) 11/07/2020  . Anasarca 11/07/2020  . Abdominal pain 11/07/2020  . Diarrhea 11/07/2020  . 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 Tubal ligation evaluation 02/03/2019  . Renal hypertension 02/02/2019  . Pulmonary edema 02/01/2019  . AMA (advanced maternal age) multigravida 35+ 01/12/2019  . Non compliance w medication regimen 01/12/2019  . Current chronic use of systemic steroids 01/12/2019  . Unwanted fertility 12/10/2018  . Anemia 10/12/2018  . Supervision of high risk pregnancy, antepartum 10/04/2018  . Lupus ([place]) 09/20/2018  . Supervision of pregnancy with grand multiparity in second trimester 09/20/2018  . Substance abuse ([place]) 09/20/2018  . Acute renal failure (ARF) ([place]) 09/20/2018  . MDD (major depressive disorder) 10/11/2015  . Asthma 09/22/2011    Past Surgical History:  Procedure Laterality Date  . IR FLUORO GUIDE CV LINE RIGHT  11/30/2020  . IR US GUIDE VASC ACCESS RIGHT  11/30/2020  . RENAL BIOPSY    . TUBAL LIGATION N/A 02/03/2019   Procedure: POST PARTUM TUBAL LIGATION;  Surgeon: Aletha Halim, MD;  Location: [facility] LD ORS;  Service: Gynecology;  Laterality: N/A;     OB  History    Gravida  6   Para  5   Term  4   Preterm  1   AB  1   Living  5     SAB  1   IAB  0   Ectopic  0   Multiple  0   Live Births  5           Family History  Problem Relation Age of Onset  . Diabetes Maternal Grandmother   . Hypertension Maternal Grandmother   . Diabetes Maternal Grandfather   . Hypertension Maternal Grandfather   . Diabetes Paternal Grandmother    . Hypertension Paternal Grandmother   . Diabetes Paternal Grandfather   . Hypertension Paternal Grandfather     Social History   Tobacco Use  . Smoking status: Former Smoker    Packs/day: 0.25    Types: Cigarettes    Quit date: 10/08/2020    Years since quitting: 0.2  . Smokeless tobacco: Never Used  Vaping Use  . Vaping Use: Never used  Substance Use Topics  . Alcohol use: Not Currently  . Drug use: Not Currently    Types: Marijuana    Comment: 3 times a week     Home Medications Prior to Admission medications   Medication Sig Start Date End Date Taking? Authorizing Provider  albuterol (PROAIR HFA) 108 (90 Base) MCG/ACT inhaler Inhale 1-2 puffs into the lungs 2 (two) times daily. 12/31/20  Yes Regalado, Belkys A, MD  amLODipine (NORVASC) 10 MG tablet Take 1 tablet (10 mg total) by mouth daily. 12/31/20  Yes Regalado, Belkys A, MD  cloNIDine (CATAPRES) 0.3 MG tablet Take 1 tablet (0.3 mg total) by mouth 3 (three) times daily. 12/31/20  Yes Regalado, Belkys A, MD  cyanocobalamin 100 MCG tablet Take 1 tablet (100 mcg total) by mouth daily. 12/31/20  Yes Regalado, Belkys A, MD  fenofibrate 160 MG tablet Take 1 tablet (160 mg total) by mouth daily. 12/31/20  Yes Regalado, Belkys A, MD  folic acid (FOLVITE) 1 MG tablet Take 1 tablet (1 mg total) by mouth daily. 12/31/20  Yes Regalado, Belkys A, MD  guaiFENesin (MUCINEX) 600 MG 12 hr tablet Take 1 tablet (600 mg total) by mouth 2 (two) times daily. 12/31/20  Yes Regalado, Belkys A, MD  ipratropium (ATROVENT HFA) 17 MCG/ACT inhaler Inhale 2 puffs into the lungs every 6 (six) hours. 12/31/20  Yes Regalado, Belkys A, MD  labetalol (NORMODYNE) 200 MG tablet Take 2 tablets (400 mg total) by mouth 2 (two) times daily. 12/31/20  Yes Regalado, Belkys A, MD  pantoprazole (PROTONIX) 40 MG tablet Take 1 tablet (40 mg total) by mouth daily. 12/31/20  Yes Regalado, Belkys A, MD  predniSONE (DELTASONE) 20 MG tablet Take 2 tablets (40 mg total) by mouth daily  with breakfast. 12/31/20  Yes Regalado, Belkys A, MD  torsemide (DEMADEX) 20 MG tablet Take 3 tablets (60 mg total) by mouth 2 (two) times daily. 12/31/20  Yes Regalado, Belkys A, MD  Vitamin D3 (VITAMIN D) 25 MCG tablet Take 1 tablet (1,000 Units total) by mouth daily. 12/31/20  Yes Regalado, Cassie Freer, MD    Allergies    Patient has no known allergies.  Review of Systems   Review of Systems  Constitutional: Positive for diaphoresis. Negative for chills and fever.  HENT: Negative for ear pain and sore throat.   Eyes: Negative for pain and visual disturbance.  Respiratory: Positive for cough, shortness of breath and wheezing. Negative  for hemoptysis and sputum production.   Cardiovascular: Negative for chest pain and palpitations.  Gastrointestinal: Negative for abdominal pain and vomiting.  Genitourinary: Negative for dysuria and hematuria.  Musculoskeletal: Negative for arthralgias and back pain.  Skin: Negative for color change and rash.  Neurological: Negative for seizures and syncope.  All other systems reviewed and are negative.   Physical Exam Updated Vital Signs BP (!) 173/114   Pulse 75   Temp 97.8 F (36.6 C)   Resp (!) 21   SpO2 100%   Physical Exam Vitals and nursing note reviewed.  Constitutional:      General: She is awake. She is in acute distress.     Appearance: She is well-developed and well-nourished. She is ill-appearing and diaphoretic.  HENT:     Head: Normocephalic and atraumatic.     Right Ear: External ear normal.     Left Ear: External ear normal.     Nose: Nose normal.  Eyes:     General: No scleral icterus.       Right eye: No discharge.        Left eye: No discharge.     Conjunctiva/sclera: Conjunctivae normal.  Cardiovascular:     Rate and Rhythm: Normal rate and regular rhythm.     Pulses: Normal pulses.          Radial pulses are 2+ on the right side and 2+ on the left side.     Heart sounds: Normal heart sounds. No murmur  heard.   Pulmonary:     Effort: Tachypnea and retractions present. No respiratory distress.     Breath sounds: Wheezing and rales present. No decreased breath sounds.  Abdominal:     General: Abdomen is flat. There is distension.     Palpations: Abdomen is soft.     Tenderness: There is no abdominal tenderness. There is no guarding or rebound.  Musculoskeletal:     Cervical back: Neck supple.     Right lower leg: 2+ Pitting Edema present.     Left lower leg: 2+ Pitting Edema present.  Skin:    General: Skin is warm.     Findings: No rash.  Neurological:     General: No focal deficit present.     Mental Status: She is alert and oriented to person, place, and time.     Motor: No weakness.  Psychiatric:        Mood and Affect: Mood and affect normal.        Behavior: Behavior is cooperative.     ED Results / Procedures / Treatments   Labs (all labs ordered are listed, but only abnormal results are displayed) Labs Reviewed  CBC WITH DIFFERENTIAL/PLATELET - Abnormal; Notable for the following components:      Result Value   WBC 14.0 (*)    RBC 2.91 (*)    Hemoglobin 8.4 (*)    HCT 27.8 (*)    Neutro Abs 9.1 (*)    Abs Immature Granulocytes 0.22 (*)    All other components within normal limits  COMPREHENSIVE METABOLIC PANEL - Abnormal; Notable for the following components:   Glucose, Bld 154 (*)    BUN 59 (*)    Creatinine, Ser 6.03 (*)    Calcium 7.0 (*)    Total Protein 5.7 (*)    Albumin 2.5 (*)    AST 151 (*)    ALT 109 (*)    Alkaline Phosphatase 187 (*)    GFR, Estimated 9 (*)  All other components within normal limits  BRAIN NATRIURETIC PEPTIDE - Abnormal; Notable for the following components:   B Natriuretic Peptide >4,500.0 (*)    All other components within normal limits  I-STAT ARTERIAL BLOOD GAS, ED - Abnormal; Notable for the following components:   pH, Arterial 7.335 (*)    pO2, Arterial 150 (*)    Calcium, Ion 0.97 (*)    HCT 23.0 (*)     Hemoglobin 7.8 (*)    All other components within normal limits  I-STAT VENOUS BLOOD GAS, ED - Abnormal; Notable for the following components:   pH, Ven 7.241 (*)    Acid-base deficit 3.0 (*)    Calcium, Ion 0.93 (*)    HCT 25.0 (*)    Hemoglobin 8.5 (*)    All other components within normal limits  TROPONIN I (HIGH SENSITIVITY) - Abnormal; Notable for the following components:   Troponin I (High Sensitivity) 59 (*)    All other components within normal limits  TROPONIN I (HIGH SENSITIVITY) - Abnormal; Notable for the following components:   Troponin I (High Sensitivity) 82 (*)    All other components within normal limits  RESP PANEL BY RT-PCR (FLU A&B, COVID) [id number]  CBC  BASIC METABOLIC PANEL    EKG None  Radiology DG Chest Portable 1 View  Result Date: 01/18/2021 CLINICAL DATA:  Short of breath, respiratory distress EXAM: PORTABLE CHEST 1 VIEW COMPARISON:  12/28/2020 FINDINGS: Single frontal view of the chest demonstrates stable right internal jugular dialysis catheter. Cardiac silhouette is unremarkable. Progressive central vascular congestion with bibasilar consolidation and effusions, consistent with volume overload. No pneumothorax. IMPRESSION: 1. Worsening volume status, with increased edema and effusions. Electronically Signed   By: Randa Ngo M.D.   On: 01/18/2021 20:05    Procedures Procedures  Medications Ordered in ED Medications  nitroGLYCERIN 50 mg in dextrose 5 % 250 mL (0.2 mg/mL) infusion (200 mcg/min Intravenous Rate/Dose Change 01/18/21 2320)  amLODipine (NORVASC) tablet 10 mg (has no administration in time range)  cloNIDine (CATAPRES) tablet 0.3 mg (0 mg Oral Hold 01/18/21 2354)  ipratropium (ATROVENT HFA) inhaler 2 puff (has no administration in time range)  fenofibrate tablet 160 mg (has no administration in time range)  vitamin B-12 (CYANOCOBALAMIN) tablet 100 mcg (has no administration in time range)  folic acid (FOLVITE) tablet 1 mg (has no  administration in time range)  albuterol (VENTOLIN HFA) 108 (90 Base) MCG/ACT inhaler 1-2 puff (0 puffs Inhalation Hold 01/18/21 2354)  torsemide (DEMADEX) tablet 60 mg (has no administration in time range)  pantoprazole (PROTONIX) EC tablet 40 mg (has no administration in time range)  predniSONE (DELTASONE) tablet 40 mg (has no administration in time range)  labetalol (NORMODYNE) injection 10-20 mg (has no administration in time range)  labetalol (NORMODYNE) tablet 400 mg (has no administration in time range)  heparin injection 5,000 Units (has no administration in time range)  acetaminophen (TYLENOL) tablet 650 mg (has no administration in time range)    Or  acetaminophen (TYLENOL) suppository 650 mg (has no administration in time range)  ondansetron (ZOFRAN) tablet 4 mg (has no administration in time range)    Or  ondansetron (ZOFRAN) injection 4 mg (has no administration in time range)  albuterol (PROVENTIL) (2.5 MG/3ML) 0.083% nebulizer solution 5 mg (5 mg Nebulization Given 01/18/21 2006)  magnesium sulfate IVPB 2 g 50 mL (0 g Intravenous Stopped 01/18/21 2103)  LORazepam (ATIVAN) injection 0.5 mg (0.5 mg Intravenous Given 01/18/21 2012)  hydrALAZINE (APRESOLINE)  injection 20 mg (20 mg Intravenous Given 01/18/21 2318)    ED Course  I have reviewed the triage vital signs and the nursing notes.  Pertinent labs & imaging results that were available during my care of the patient were reviewed by me and considered in my medical decision making (see chart for details).    MDM Rules/Calculators/A&P                         Patient is a 45yoF with history and physical as described above who presents to the ED for respiratory distress. Patient tachypneic and hypertensive with SBP 180s. Satting well on 15l NRB by EMS. Patient transitioned BiPAP with improvement of respiratory status. Given persistently elevated BP, patient started on NTG gtt. Initial workup included CXR, ECG, BNP, ABG, CBC, CMP,  troponin, COVID, and BNP. In addition to EMS Solumedrol and Duoneb, IV magnesium and albuterol nebulizer given for persistent wheezing and previous history of asthma. Ativan also given for anxiety when BiPAP first applied.  On reassessment, patient states breathing feels significantly improved and now appears more comfortable. Remains persistently hypertensive and tachypneic. Diagnostic workup consistent with fluid volume overload and pulmonary edema. Presentation appears consistent with hypertensive emergency with flash pulmonary edema. Discussed with nephrology for emergent dialysis - no dialysis machines available right now but will be dialyzed as soon as machine becomes available. Recommend hospitalist admission for further BP control in the mean time. Discussed patient with hospitalist who will admit for further care and evaluation. Patient otherwise remained HDS and satting well on BiPAP with no further acute events.  Final Clinical Impression(s) / ED Diagnoses Final diagnoses:  Acute respiratory failure with hypoxia ([place])  Hypertensive crisis    Rx / DC Orders ED Discharge Orders    None       Christy Gentles, MD 01/19/21 0105    Sherwood Gambler, MD 01/19/21 [phone]

## 2021-01-18 NOTE — ED Notes (Signed)
PO meds held, pt on bipap

## 2021-01-18 NOTE — ED Triage Notes (Signed)
Pt comes via [facility] riding down the road, sudden onset of SOB and CP, unrelieved by albuterol inhaler, PTA given '5mg'$  albuterol neb and 0.5 atrovent and 125 solumedrol and 2 nitro. Pt diaphoretic, smells of mariajuana. Hx of asthma, lupus and CHF and dialysis. Last treatment yesterday.

## 2021-01-19 ENCOUNTER — Other Ambulatory Visit: Payer: Self-pay

## 2021-01-19 DIAGNOSIS — I169 Hypertensive crisis, unspecified: Secondary | ICD-10-CM | POA: Diagnosis not present

## 2021-01-19 DIAGNOSIS — N179 Acute kidney failure, unspecified: Secondary | ICD-10-CM | POA: Diagnosis present

## 2021-01-19 DIAGNOSIS — J810 Acute pulmonary edema: Secondary | ICD-10-CM | POA: Diagnosis present

## 2021-01-19 DIAGNOSIS — Z992 Dependence on renal dialysis: Secondary | ICD-10-CM | POA: Diagnosis not present

## 2021-01-19 DIAGNOSIS — I161 Hypertensive emergency: Secondary | ICD-10-CM

## 2021-01-19 DIAGNOSIS — R0602 Shortness of breath: Secondary | ICD-10-CM | POA: Diagnosis not present

## 2021-01-19 DIAGNOSIS — M3214 Glomerular disease in systemic lupus erythematosus: Secondary | ICD-10-CM | POA: Diagnosis present

## 2021-01-19 DIAGNOSIS — Z79899 Other long term (current) drug therapy: Secondary | ICD-10-CM | POA: Diagnosis not present

## 2021-01-19 DIAGNOSIS — J9601 Acute respiratory failure with hypoxia: Secondary | ICD-10-CM | POA: Diagnosis not present

## 2021-01-19 DIAGNOSIS — Z9114 Patient's other noncompliance with medication regimen: Secondary | ICD-10-CM | POA: Diagnosis not present

## 2021-01-19 DIAGNOSIS — F419 Anxiety disorder, unspecified: Secondary | ICD-10-CM | POA: Diagnosis present

## 2021-01-19 DIAGNOSIS — N186 End stage renal disease: Secondary | ICD-10-CM | POA: Diagnosis not present

## 2021-01-19 DIAGNOSIS — L89159 Pressure ulcer of sacral region, unspecified stage: Secondary | ICD-10-CM | POA: Diagnosis present

## 2021-01-19 DIAGNOSIS — F3130 Bipolar disorder, current episode depressed, mild or moderate severity, unspecified: Secondary | ICD-10-CM | POA: Diagnosis present

## 2021-01-19 DIAGNOSIS — Z9851 Tubal ligation status: Secondary | ICD-10-CM | POA: Diagnosis not present

## 2021-01-19 DIAGNOSIS — I120 Hypertensive chronic kidney disease with stage 5 chronic kidney disease or end stage renal disease: Secondary | ICD-10-CM | POA: Diagnosis present

## 2021-01-19 DIAGNOSIS — Z7952 Long term (current) use of systemic steroids: Secondary | ICD-10-CM | POA: Diagnosis not present

## 2021-01-19 DIAGNOSIS — J45909 Unspecified asthma, uncomplicated: Secondary | ICD-10-CM | POA: Diagnosis present

## 2021-01-19 DIAGNOSIS — E8770 Fluid overload, unspecified: Secondary | ICD-10-CM | POA: Diagnosis present

## 2021-01-19 DIAGNOSIS — Z87891 Personal history of nicotine dependence: Secondary | ICD-10-CM | POA: Diagnosis not present

## 2021-01-19 DIAGNOSIS — E8779 Other fluid overload: Secondary | ICD-10-CM

## 2021-01-19 DIAGNOSIS — Z8249 Family history of ischemic heart disease and other diseases of the circulatory system: Secondary | ICD-10-CM | POA: Diagnosis not present

## 2021-01-19 DIAGNOSIS — Z20822 Contact with and (suspected) exposure to covid-19: Secondary | ICD-10-CM | POA: Diagnosis present

## 2021-01-19 DIAGNOSIS — N2581 Secondary hyperparathyroidism of renal origin: Secondary | ICD-10-CM | POA: Diagnosis present

## 2021-01-19 DIAGNOSIS — D631 Anemia in chronic kidney disease: Secondary | ICD-10-CM | POA: Diagnosis present

## 2021-01-19 DIAGNOSIS — J90 Pleural effusion, not elsewhere classified: Secondary | ICD-10-CM | POA: Diagnosis not present

## 2021-01-19 LAB — BASIC METABOLIC PANEL
Anion gap: 12 (ref 5–15)
BUN: 34 mg/dL — ABNORMAL HIGH (ref 6–20)
CO2: 25 mmol/L (ref 22–32)
Calcium: 7.4 mg/dL — ABNORMAL LOW (ref 8.9–10.3)
Chloride: 101 mmol/L (ref 98–111)
Creatinine, Ser: 4.35 mg/dL — ABNORMAL HIGH (ref 0.44–1.00)
GFR, Estimated: 13 mL/min — ABNORMAL LOW (ref >60)
Glucose, Bld: 140 mg/dL — ABNORMAL HIGH (ref 70–99)
Potassium: 3.9 mmol/L (ref 3.5–5.1)
Sodium: 138 mmol/L (ref 135–145)

## 2021-01-19 LAB — CBC
HCT: 26.6 % — ABNORMAL LOW (ref 36.0–46.0)
Hemoglobin: 8.5 g/dL — ABNORMAL LOW (ref 12.0–15.0)
MCH: 28.6 pg (ref 26.0–34.0)
MCHC: 32 g/dL (ref 30.0–36.0)
MCV: 89.6 fL (ref 80.0–100.0)
Platelets: 176 10*3/uL (ref 150–400)
RBC: 2.97 MIL/uL — ABNORMAL LOW (ref 3.87–5.11)
RDW: 14.6 % (ref 11.5–15.5)
WBC: 7 10*3/uL (ref 4.0–10.5)
nRBC: 0.4 % — ABNORMAL HIGH (ref 0.0–0.2)

## 2021-01-19 LAB — TROPONIN I (HIGH SENSITIVITY): Troponin I (High Sensitivity): 82 ng/L — ABNORMAL HIGH (ref <18)

## 2021-01-19 MED ORDER — AMLODIPINE BESYLATE 10 MG PO TABS
10.0000 mg | ORAL_TABLET | Freq: Every day | ORAL | Status: DC
  Administered 2021-01-19: 10 mg via ORAL
  Filled 2021-01-19: qty 2

## 2021-01-19 MED ORDER — LIDOCAINE-PRILOCAINE 2.5-2.5 % EX CREA: 1.0000 "application " | TOPICAL_CREAM | CUTANEOUS | Status: DC | PRN

## 2021-01-19 MED ORDER — ONDANSETRON HCL 4 MG/2ML IJ SOLN: 4.0000 mg | Freq: Four times a day (QID) | INTRAMUSCULAR | Status: DC | PRN

## 2021-01-19 MED ORDER — HEPARIN SODIUM (PORCINE) 1000 UNIT/ML IJ SOLN
INTRAMUSCULAR | Status: AC
  Filled 2021-01-19: qty 4

## 2021-01-19 MED ORDER — HEPARIN SODIUM (PORCINE) 1000 UNIT/ML DIALYSIS
1000.0000 [IU] | INTRAMUSCULAR | Status: DC | PRN
  Filled 2021-01-19: qty 1

## 2021-01-19 MED ORDER — IPRATROPIUM BROMIDE HFA 17 MCG/ACT IN AERS: 2.0000 | INHALATION_SPRAY | RESPIRATORY_TRACT | Status: DC

## 2021-01-19 MED ORDER — IPRATROPIUM BROMIDE HFA 17 MCG/ACT IN AERS
2.0000 | INHALATION_SPRAY | Freq: Three times a day (TID) | RESPIRATORY_TRACT | Status: DC
  Administered 2021-01-20 (×2): 2 via RESPIRATORY_TRACT

## 2021-01-19 MED ORDER — PENTAFLUOROPROP-TETRAFLUOROETH EX AERO: 1.0000 "application " | INHALATION_SPRAY | CUTANEOUS | Status: DC | PRN

## 2021-01-19 MED ORDER — SODIUM CHLORIDE 0.9 % IV SOLN: 100.0000 mL | INTRAVENOUS | Status: DC | PRN

## 2021-01-19 MED ORDER — CHLORHEXIDINE GLUCONATE CLOTH 2 % EX PADS
6.0000 | MEDICATED_PAD | Freq: Every day | CUTANEOUS | Status: DC
  Administered 2021-01-20: 6 via TOPICAL

## 2021-01-19 MED ORDER — IPRATROPIUM BROMIDE 0.02 % IN SOLN: 0.5000 mg | Freq: Three times a day (TID) | RESPIRATORY_TRACT | Status: DC

## 2021-01-19 MED ORDER — ACETAMINOPHEN 325 MG PO TABS
650.0000 mg | ORAL_TABLET | Freq: Four times a day (QID) | ORAL | Status: DC | PRN
  Administered 2021-01-19 – 2021-01-20 (×3): 650 mg via ORAL
  Filled 2021-01-19 (×3): qty 2

## 2021-01-19 MED ORDER — ACETAMINOPHEN 650 MG RE SUPP: 650.0000 mg | Freq: Four times a day (QID) | RECTAL | Status: DC | PRN

## 2021-01-19 MED ORDER — ONDANSETRON HCL 4 MG PO TABS: 4.0000 mg | ORAL_TABLET | Freq: Four times a day (QID) | ORAL | Status: DC | PRN

## 2021-01-19 MED ORDER — HEPARIN SODIUM (PORCINE) 1000 UNIT/ML DIALYSIS
3000.0000 [IU] | INTRAMUSCULAR | Status: DC | PRN
  Filled 2021-01-19: qty 3

## 2021-01-19 MED ORDER — ALTEPLASE 2 MG IJ SOLR
2.0000 mg | Freq: Once | INTRAMUSCULAR | Status: DC | PRN
  Filled 2021-01-19: qty 2

## 2021-01-19 MED ORDER — HEPARIN SODIUM (PORCINE) 5000 UNIT/ML IJ SOLN
5000.0000 [IU] | Freq: Three times a day (TID) | INTRAMUSCULAR | Status: DC
  Administered 2021-01-19 – 2021-01-20 (×5): 5000 [IU] via SUBCUTANEOUS
  Filled 2021-01-19 (×6): qty 1

## 2021-01-19 MED ORDER — LABETALOL HCL 5 MG/ML IV SOLN
10.0000 mg | INTRAVENOUS | Status: DC | PRN
  Administered 2021-01-19: 20 mg via INTRAVENOUS
  Filled 2021-01-19: qty 4

## 2021-01-19 MED ORDER — IPRATROPIUM BROMIDE HFA 17 MCG/ACT IN AERS: 2.0000 | INHALATION_SPRAY | Freq: Three times a day (TID) | RESPIRATORY_TRACT | Status: DC

## 2021-01-19 MED ORDER — HYDRALAZINE HCL 20 MG/ML IJ SOLN
20.0000 mg | INTRAMUSCULAR | Status: DC | PRN
  Administered 2021-01-19 – 2021-01-20 (×2): 20 mg via INTRAVENOUS
  Filled 2021-01-19 (×2): qty 1

## 2021-01-19 MED ORDER — LABETALOL HCL 200 MG PO TABS: 400.0000 mg | ORAL_TABLET | Freq: Two times a day (BID) | ORAL | Status: DC

## 2021-01-19 MED ORDER — LABETALOL HCL 200 MG PO TABS
400.0000 mg | ORAL_TABLET | Freq: Two times a day (BID) | ORAL | Status: DC
  Administered 2021-01-19 (×2): 400 mg via ORAL
  Filled 2021-01-19 (×2): qty 2

## 2021-01-19 MED ORDER — LIDOCAINE HCL (PF) 1 % IJ SOLN: 5.0000 mL | INTRAMUSCULAR | Status: DC | PRN

## 2021-01-19 NOTE — H&P (Addendum)
History and Physical    Cathy [name] [id number] DOB: 1981-07-31 DOA: 01/18/2021  PCP: Patient, No Pcp Per  Patient coming from: Home  I have personally briefly reviewed patient's old medical records in [place]  Chief Complaint: SOB and CP  HPI: Cathy [name] is a 40 y.o. 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  Pt reports missing tues dialysis but going on Thurs for "almost 5 hours".  This AM however she didn't take her BP meds at home "because they make me sleepy if I take them in the morning" so she has been trying to only take them once a day in the evening.  Had sudden onset severe SOB and CP while in car just PTA today.  EMS called for respiratory distress.  Symptoms are severe, persistent, nothing tried for symptoms.  No fever, chills, N/V/D, abd pain.   ED Course: BP 200/130 initially.  Pt started on rescue BIPAP and NTG gtt, repeat BP 190/130.  Given '20mg'$  IV hydralazine, BP now 173/114.  Nephrology consulted, hope to get her on dialysis shortly.  Hospitalist asked to admit.  CXR shows volume overload.  BNP > 4500   Review of Systems: As per HPI, otherwise all review of systems negative.  Past Medical History:  Diagnosis Date  . Anxiety   . Asthma   . Bipolar depression ([place])   . Depression   . Gonorrhea   . Lupus ([place])   . Pelvic inflammatory disease (PID)   . Renal hypertension   . Trichomonas infection     Past Surgical History:  Procedure Laterality Date  . IR FLUORO GUIDE CV LINE RIGHT  11/30/2020  . IR US GUIDE VASC ACCESS RIGHT  11/30/2020  . RENAL BIOPSY    . TUBAL LIGATION N/A 02/03/2019   Procedure: POST PARTUM TUBAL LIGATION;  Surgeon: Aletha Halim, MD;  Location: [facility];  Service: Gynecology;  Laterality: N/A;     reports that she quit smoking about 3 months ago. Her smoking use included cigarettes. She smoked 0.25 packs per day. She has never used  smokeless tobacco. She reports previous alcohol use. She reports previous drug use. Drug: Marijuana.  No Known Allergies  Family History  Problem Relation Age of Onset  . Diabetes Maternal Grandmother   . Hypertension Maternal Grandmother   . Diabetes Maternal Grandfather   . Hypertension Maternal Grandfather   . Diabetes Paternal Grandmother   . Hypertension Paternal Grandmother   . Diabetes Paternal Grandfather   . Hypertension Paternal Grandfather      Prior to Admission medications   Medication Sig Start Date End Date Taking? Authorizing Provider  albuterol (PROAIR HFA) 108 (90 Base) MCG/ACT inhaler Inhale 1-2 puffs into the lungs 2 (two) times daily. 12/31/20  Yes Regalado, Belkys A, MD  amLODipine (NORVASC) 10 MG tablet Take 1 tablet (10 mg total) by mouth daily. 12/31/20  Yes Regalado, Belkys A, MD  cloNIDine (CATAPRES) 0.3 MG tablet Take 1 tablet (0.3 mg total) by mouth 3 (three) times daily. 12/31/20  Yes Regalado, Belkys A, MD  cyanocobalamin 100 MCG tablet Take 1 tablet (100 mcg total) by mouth daily. 12/31/20  Yes Regalado, Belkys A, MD  fenofibrate 160 MG tablet Take 1 tablet (160 mg total) by mouth daily. 12/31/20  Yes Regalado, Belkys A, MD  folic acid (FOLVITE) 1 MG tablet Take 1 tablet (1 mg total) by mouth daily. 12/31/20  Yes Regalado, Belkys A,  MD  guaiFENesin (MUCINEX) 600 MG 12 hr tablet Take 1 tablet (600 mg total) by mouth 2 (two) times daily. 12/31/20  Yes Regalado, Belkys A, MD  ipratropium (ATROVENT HFA) 17 MCG/ACT inhaler Inhale 2 puffs into the lungs every 6 (six) hours. 12/31/20  Yes Regalado, Belkys A, MD  labetalol (NORMODYNE) 200 MG tablet Take 2 tablets (400 mg total) by mouth 2 (two) times daily. 12/31/20  Yes Regalado, Belkys A, MD  pantoprazole (PROTONIX) 40 MG tablet Take 1 tablet (40 mg total) by mouth daily. 12/31/20  Yes Regalado, Belkys A, MD  predniSONE (DELTASONE) 20 MG tablet Take 2 tablets (40 mg total) by mouth daily with breakfast. 12/31/20  Yes  Regalado, Belkys A, MD  torsemide (DEMADEX) 20 MG tablet Take 3 tablets (60 mg total) by mouth 2 (two) times daily. 12/31/20  Yes Regalado, Belkys A, MD  Vitamin D3 (VITAMIN D) 25 MCG tablet Take 1 tablet (1,000 Units total) by mouth daily. 12/31/20  Yes Elmarie Shiley, MD    Physical Exam: Vitals:   01/18/21 2345 01/19/21 0000 01/19/21 0015 01/19/21 0030  BP: (!) 158/110 (!) 175/109 (!) 185/125 (!) 173/114  Pulse: 76 74 77 75  Resp:  20 20 (!) 21  Temp:      SpO2: 99% 99% 99% 100%    Constitutional: NAD, calm, comfortable Eyes: PERRL, lids and conjunctivae normal ENMT: Mucous membranes are moist. Posterior pharynx clear of any exudate or lesions.Normal dentition.  Neck: normal, supple, no masses, no thyromegaly Respiratory: Crackles and wheezes Cardiovascular: Regular rate and rhythm, no murmurs / rubs / gallops. No extremity edema. 2+ pedal pulses. No carotid bruits.  Abdomen: no tenderness, no masses palpated. No hepatosplenomegaly. Bowel sounds positive.  Musculoskeletal: no clubbing / cyanosis. No joint deformity upper and lower extremities. Good ROM, no contractures. Normal muscle tone.  Skin: Sacral decubitus present Neurologic: CN 2-12 grossly intact. Sensation intact, DTR normal. Strength 5/5 in all 4.  Psychiatric: Normal judgment and insight. Alert and oriented x 3. Normal mood.    Labs on Admission: I have personally reviewed following labs and imaging studies  CBC: Recent Labs  Lab 01/14/21 1414 01/18/21 1952 01/18/21 2007 01/18/21 2111  WBC 7.0 14.0*  --   --   NEUTROABS 6.2 9.1*  --   --   HGB 7.8* 8.4* 8.5* 7.8*  HCT 24.6* 27.8* 25.0* 23.0*  MCV 92.1 95.5  --   --   PLT 152 201  --   --    Basic Metabolic Panel: Recent Labs  Lab 01/14/21 1414 01/18/21 1952 01/18/21 2007 01/18/21 2111  NA 141 141 139 139  K 5.0 4.5 4.6 4.5  CL 105 105  --   --   CO2 19* 22  --   --   GLUCOSE 179* 154*  --   --   BUN 77* 59*  --   --   CREATININE 6.98* 6.03*   --   --   CALCIUM 6.8* 7.0*  --   --    GFR: CrCl cannot be calculated (Unknown ideal weight.). Liver Function Tests: Recent Labs  Lab 01/14/21 1414 01/18/21 1952  AST 22 151*  ALT 34 109*  ALKPHOS 97 187*  BILITOT 0.5 0.8  PROT 6.0* 5.7*  ALBUMIN 2.6* 2.5*   No results for input(s): LIPASE, AMYLASE in the last 168 hours. No results for input(s): AMMONIA in the last 168 hours. Coagulation Profile: No results for input(s): INR, PROTIME in the last 168 hours. Cardiac  Enzymes: No results for input(s): CKTOTAL, CKMB, CKMBINDEX, TROPONINI in the last 168 hours. BNP (last 3 results) No results for input(s): PROBNP in the last 8760 hours. HbA1C: No results for input(s): HGBA1C in the last 72 hours. CBG: No results for input(s): GLUCAP in the last 168 hours. Lipid Profile: No results for input(s): CHOL, HDL, LDLCALC, TRIG, CHOLHDL, LDLDIRECT in the last 72 hours. Thyroid Function Tests: No results for input(s): TSH, T4TOTAL, FREET4, T3FREE, THYROIDAB in the last 72 hours. Anemia Panel: No results for input(s): VITAMINB12, FOLATE, FERRITIN, TIBC, IRON, RETICCTPCT in the last 72 hours. Urine analysis:    Component Value Date/Time   COLORURINE YELLOW 11/29/2020 2054   APPEARANCEUR HAZY (A) 11/29/2020 2054   LABSPEC 1.015 11/29/2020 2054   PHURINE 6.0 11/29/2020 2054   GLUCOSEU NEGATIVE 11/29/2020 2054   HGBUR MODERATE (A) 11/29/2020 2054   BILIRUBINUR NEGATIVE 11/29/2020 2054   [place] 11/29/2020 2054   PROTEINUR >=300 (A) 11/29/2020 2054   UROBILINOGEN 0.2 10/17/2020 1026   NITRITE NEGATIVE 11/29/2020 2054   LEUKOCYTESUR NEGATIVE 11/29/2020 2054    Radiological Exams on Admission: DG Chest Portable 1 View  Result Date: 01/18/2021 CLINICAL DATA:  Short of breath, respiratory distress EXAM: PORTABLE CHEST 1 VIEW COMPARISON:  12/28/2020 FINDINGS: Single frontal view of the chest demonstrates stable right internal jugular dialysis catheter. Cardiac silhouette is  unremarkable. Progressive central vascular congestion with bibasilar consolidation and effusions, consistent with volume overload. No pneumothorax. IMPRESSION: 1. Worsening volume status, with increased edema and effusions. Electronically Signed   By: Randa Ngo M.D.   On: 01/18/2021 20:05    EKG: Independently reviewed.  Assessment/Plan Principal Problem:   Hypertensive emergency Active Problems:   Non compliance w medication regimen   Volume overload   Lupus nephritis (H[facility])   Flash pulmonary edema ([facility])   ESRD (end stage renal disease) ([place])   Respiratory failure ([place])    1. Flash pulmonary edema due to HTN emergency 1. Rescue BIPAP for respiratory failure 2. NTG gtt 3. IV hydralazine x1 in ED 4. IV labetalol PRN (until we can get her off of BIPAP and back on PO meds including PO labetalol). 5. Resume PO amlodipine and clondine as soon as able to take POs 6. Tele monitor 7. Nephrology consulted -> plan to get pt on dialysis machine this morning to try and help with volume overload 2. ESRD - dialysis as above 3. Non-compliance with BP meds - 1. Emphasized need to take these as directed to patient 4. Lupus nephritis 1. Cont prednisone 2. Sounds like she may be overdue for 2nd dose of cytoxan 5. Sacral decubitus - 1. Wound care consult  DVT prophylaxis: Heparin Burden Code Status: Full Family Communication: No family in room Disposition Plan: Home after BP controlled and volume status improved and respiratory status improved Consults called: Nephrology Admission status: Admit to inpatient  Severity of Illness: The appropriate patient status for this patient is INPATIENT. Inpatient status is judged to be reasonable and necessary in order to provide the required intensity of service to ensure the patient's safety. The patient's presenting symptoms, physical exam findings, and initial radiographic and laboratory data in the context of their chronic comorbidities is felt to place  them at high risk for further clinical deterioration. Furthermore, it is not anticipated that the patient will be medically stable for discharge from the hospital within 2 midnights of admission. The following factors support the patient status of inpatient.   IP status due to HTN emergency, volume  overload, likely to require multiple dialysis sessions.  * I certify that at the point of admission it is my clinical judgment that the patient will require inpatient hospital care spanning beyond 2 midnights from the point of admission due to high intensity of service, high risk for further deterioration and high frequency of surveillance required.*    Ricco Dershem M. DO [facility]  How to contact the [facility] Attending or Consulting provider [place] or covering provider during after hours [place], for this patient?  1. Check the care team in [facility] and look for a) attending/consulting [facility] provider listed and b) the [facility] team listed 2. Log into [email or web address]  Amion Physician Scheduling and messaging for groups and whole hospitals  On call and physician scheduling software for group practices, residents, hospitalists and other medical providers for call, clinic, rotation and shift schedules. On[employer] is a hospital-wide system for scheduling doctors and paging doctors on call. EasyPlot is for scientific plotting and data analysis.  [email or web address]  and use [place]'s universal password to access. If you do not have the password, please contact the hospital operator.  3. Locate the [facility] provider you are looking for under [facility] and page to a number that you can be directly reached. 4. If you still have difficulty reaching the provider, please page the [facility] (Director on Call) for the Hospitalists listed on amion for assistance.  01/19/2021, 12:32 AM

## 2021-01-19 NOTE — Plan of Care (Signed)

## 2021-01-19 NOTE — ED Notes (Signed)
Pt taken off bipap for trial

## 2021-01-19 NOTE — Progress Notes (Signed)
   Patient seen and examined at bedside, patient admitted after midnight, please see earlier detailed admission note by Etta Quill, DO. Briefly, patient presented secondary to dyspnea in setting of flash pulmonary edema secondary to hypertensive emergency.  Subjective: Breathing better after starting HD.  BP (!) 161/115   Pulse (!) 25   Temp 97.8 F (36.6 C)   Resp (!) 27   Ht '5\' 7"'$  (1.702 m)   Wt 85 kg   SpO2 95%   BMI 29.35 kg/m   General exam: Appears calm and comfortable Respiratory system: Clear to auscultation. Respiratory effort normal. Cardiovascular system: S1 & S2 heard, RRR. No murmurs, rubs, gallops or clicks. Gastrointestinal system: Abdomen is distended, soft and nontender. No organomegaly or masses felt. Normal bowel sounds heard. Central nervous system: Alert and oriented. No focal neurological deficits. Musculoskeletal: 2+ BLE edema. No calf tenderness Skin: No cyanosis. No rashes Psychiatry: Judgement and insight appear normal. Mood & affect appropriate.   Brief assessment/Plan:  Flash pulmonary edema Secondary to hypertension. Patient underwent HD urgently with resolution of symptoms.  Hypertensive emergency Secondary to patient missing doses prior to admission.  ESRD on HD Secondary to lupus nephritis. prednisone scheduled. Nephrology consulted.  Lupus nephritis -Continue prednisone   Family communication: None at bedside DVT prophylaxis: Heparin subq Disposition: Discharge home in 1-2 days  Cordelia Poche, MD [facility] 01/19/2021, 7:37 AM

## 2021-01-19 NOTE — ED Notes (Signed)
SDU Breakfast Ordered 

## 2021-01-19 NOTE — Consult Note (Signed)
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  She is prescribed to be on a TTS dialysis schedule and last went to hemodialysis on Thursday 2/17 completing 4 hours of dialysis and leaving 0.3 kg above her dry weight-this was after missing an entire week of dialysis with multiple reasons for not going.  She presented to the emergency room with increasing chest tightness/chest pain as well as shortness of breath without any cough, sputum production, fevers, chills, nausea, vomiting or diarrhea.  Noted to have severely elevated blood pressures in the emergency room and initial shortness of breath temporized with BiPAP.  Hemodialysis prescription: [facility], TTS, designated acute kidney injury, right IJ TDC, EDW 83.5 kg, 180 dialyzer, 4 hours, BFR 400/DFR 800, 3K/2.5 calcium, Mircera 100 mcg IV every 2 weeks  Past Medical History:  Diagnosis Date  . Anxiety   . Asthma   . Bipolar depression ([place])   . Depression   . Gonorrhea   . Lupus ([place])   . Pelvic inflammatory disease (PID)   . Renal hypertension   . Trichomonas infection     Past Surgical History:  Procedure Laterality Date  . IR FLUORO GUIDE CV LINE RIGHT  11/30/2020  . IR US GUIDE VASC ACCESS RIGHT  11/30/2020  . RENAL BIOPSY    . TUBAL LIGATION N/A 02/03/2019   Procedure: POST PARTUM TUBAL LIGATION;  Surgeon: Aletha Halim, MD;  Location: [facility] LD ORS;  Service: Gynecology;  Laterality: N/A;    Family History  Problem Relation Age of Onset  . Diabetes Maternal Grandmother   . Hypertension Maternal Grandmother   .  Diabetes Maternal Grandfather   . Hypertension Maternal Grandfather   . Diabetes Paternal Grandmother   . Hypertension Paternal Grandmother   . Diabetes Paternal Grandfather   . Hypertension Paternal Grandfather     Social History:  reports that she quit smoking about 3 months ago. Her smoking use included cigarettes. She smoked 0.25 packs per day. She has never used smokeless tobacco. She reports previous alcohol use. She reports previous drug use. Drug: Marijuana.  Allergies: No Known Allergies  Medications:  Scheduled: . albuterol  1-2 puff Inhalation BID  . amLODipine  10 mg Oral Daily  . Chlorhexidine Gluconate Cloth  6 each Topical Q0600  . cloNIDine  0.3 mg Oral TID  . fenofibrate  160 mg Oral Daily  . folic acid  1 mg Oral Daily  . heparin  5,000 Units Subcutaneous Q8H  . ipratropium  2 puff Inhalation Q6H  . labetalol  400 mg Oral BID  . pantoprazole  40 mg Oral Daily  . predniSONE  40 mg Oral Q breakfast  . torsemide  60 mg Oral BID  . cyanocobalamin  100 mcg Oral Daily    BMP Latest Ref Rng & Units 01/18/2021 01/18/2021 01/18/2021  Glucose 70 - 99 mg/dL - - 154(H)  BUN 6 - 20 mg/dL - - 59(H)  Creatinine 0.44 - 1.00 mg/dL - - 6.03(H)  BUN/Creat Ratio 9 - 23 - - -  Sodium 135 - 145 mmol/L 139 139 141  Potassium 3.5 - 5.1 mmol/L 4.5 4.6 4.5  Chloride 98 - 111 mmol/L - - 105  CO2 22 -  32 mmol/L - - 22  Calcium 8.9 - 10.3 mg/dL - - 7.0(L)   CBC Latest Ref Rng & Units 01/18/2021 01/18/2021 01/18/2021  WBC 4.0 - 10.5 K/uL - - 14.0(H)  Hemoglobin 12.0 - 15.0 g/dL 7.8(L) 8.5(L) 8.4(L)  Hematocrit 36.0 - 46.0 % 23.0(L) 25.0(L) 27.8(L)  Platelets 150 - 400 K/uL - - 201     DG Chest Portable 1 View  Result Date: 01/18/2021 CLINICAL DATA:  Short of breath, respiratory distress EXAM: PORTABLE CHEST 1 VIEW COMPARISON:  12/28/2020 FINDINGS: Single frontal view of the chest demonstrates stable right internal jugular dialysis catheter. Cardiac silhouette is unremarkable.  Progressive central vascular congestion with bibasilar consolidation and effusions, consistent with volume overload. No pneumothorax. IMPRESSION: 1. Worsening volume status, with increased edema and effusions. Electronically Signed   By: Randa Ngo M.D.   On: 01/18/2021 20:05    Review of Systems  Constitutional: Positive for fatigue. Negative for appetite change, chills and fever.  HENT: Negative for nosebleeds, sinus pressure, sinus pain, sore throat and trouble swallowing.   Eyes: Negative for pain, redness and visual disturbance.  Respiratory: Positive for chest tightness and shortness of breath. Negative for cough and choking.   Cardiovascular: Positive for chest pain and leg swelling.  Gastrointestinal: Negative for abdominal pain, diarrhea, nausea and vomiting.  Endocrine: Negative for cold intolerance and heat intolerance.  Genitourinary: Negative for dysuria, flank pain and hematuria.  Musculoskeletal: Positive for back pain and myalgias.  Skin: Negative for rash and wound.  Neurological: Positive for headaches. Negative for dizziness, weakness and light-headedness.   Blood pressure (!) 161/115, pulse (!) 25, temperature 97.8 F (36.6 C), resp. rate (!) 27, height '5\' 7"'$  (1.702 m), weight 85 kg, SpO2 95 %, unknown if currently breastfeeding. Physical Exam Vitals and nursing note reviewed.  Constitutional:      General: She is not in acute distress.    Appearance: Normal appearance. She is ill-appearing.     Comments: Seen on hemodialysis, sleepy but awakens easily to calling out her name and engages in conversation.  HENT:     Head: Normocephalic and atraumatic.     Right Ear: External ear normal.     Left Ear: External ear normal.     Mouth/Throat:     Mouth: Mucous membranes are moist.     Pharynx: No oropharyngeal exudate.  Eyes:     Conjunctiva/sclera: Conjunctivae normal.     Pupils: Pupils are equal, round, and reactive to light.  Neck:     Comments: JVP to angle  of jaw Cardiovascular:     Rate and Rhythm: Regular rhythm. Tachycardia present.     Heart sounds: Normal heart sounds. No murmur heard.   Pulmonary:     Effort: Pulmonary effort is normal.     Breath sounds: Rales present.     Comments: Fine rales bilaterally Abdominal:     General: Abdomen is flat.     Palpations: Abdomen is soft.     Tenderness: There is no abdominal tenderness. There is no guarding.  Musculoskeletal:     Cervical back: Normal range of motion and neck supple.     Right lower leg: Edema present.     Left lower leg: Edema present.     Comments: 3+/4+ lower extremity edema  Neurological:     Mental Status: She is alert and oriented to person, place, and time.  Psychiatric:        Mood and Affect: Mood normal.    Assessment/Plan: 1.  Acute hypoxic respiratory failure: Secondary to volume overload/flash pulmonary edema and will be treated with hemodialysis-unfortunately due to patient volume the patient could not undertake dialysis last night and is currently getting dialysis this morning.  We will challenge her dry weight which will need to be aggressively lowered based on significant edema that she has.  Her spotty adherence with outpatient dialysis is likely culpable for this. 2.  Hypertensive emergency: Secondary to suboptimal dialysis/volume overload as well as suboptimal adherence with her outpatient antihypertensive therapy.  Transitioned back to her oral antihypertensive medications and will prescribe ultrafiltration with dialysis. 3.  End-stage renal disease: With prolonged dialysis dependent acute kidney injury and no clear evidence of renal recovery based on labs.  Emergent hemodialysis today for volume unloading. 4.  Anemia: Secondary to chronic kidney disease, no overt blood loss and will continue ESA. 5.  Systemic lupus erythematosus: Previously treated with cyclophosphamide and with ongoing prednisone. 6.  Secondary hyperparathyroidism: Continue to follow  calcium/phosphorus trend to decide on need for phosphorus binder  Truong Delcastillo K. 01/19/2021, 7:38 AM

## 2021-01-19 NOTE — ED Notes (Signed)
Patients mother would like her to call her back once she gets back from hemodialysis. Lelon Frohlich [phone]

## 2021-01-19 NOTE — Progress Notes (Signed)
Patient refused use of BIPAP for the evening.  °

## 2021-01-19 NOTE — Plan of Care (Signed)
  Problem: Education: Goal: Knowledge of General Education information will improve Description: Including pain rating scale, medication(s)/side effects and non-pharmacologic comfort measures 01/19/2021 1248 by Camillia Herter, RN Outcome: Progressing 01/19/2021 1248 by Camillia Herter, RN Outcome: Progressing   Problem: Health Behavior/Discharge Planning: Goal: Ability to manage health-related needs will improve 01/19/2021 1248 by Camillia Herter, RN Outcome: Progressing 01/19/2021 1248 by Camillia Herter, RN Outcome: Progressing   Problem: Clinical Measurements: Goal: Ability to maintain clinical measurements within normal limits will improve 01/19/2021 1248 by Camillia Herter, RN Outcome: Progressing 01/19/2021 1248 by Camillia Herter, RN Outcome: Progressing Goal: Will remain free from infection 01/19/2021 1248 by Camillia Herter, RN Outcome: Progressing 01/19/2021 1248 by Camillia Herter, RN Outcome: Progressing Goal: Diagnostic test results will improve 01/19/2021 1248 by Camillia Herter, RN Outcome: Progressing 01/19/2021 1248 by Camillia Herter, RN Outcome: Progressing Goal: Respiratory complications will improve 01/19/2021 1248 by Camillia Herter, RN Outcome: Progressing 01/19/2021 1248 by Camillia Herter, RN Outcome: Progressing Goal: Cardiovascular complication will be avoided 01/19/2021 1248 by Camillia Herter, RN Outcome: Progressing 01/19/2021 1248 by Camillia Herter, RN Outcome: Progressing   Problem: Activity: Goal: Risk for activity intolerance will decrease 01/19/2021 1248 by Camillia Herter, RN Outcome: Progressing 01/19/2021 1248 by Camillia Herter, RN Outcome: Progressing   Problem: Nutrition: Goal: Adequate nutrition will be maintained 01/19/2021 1248 by Camillia Herter, RN Outcome: Progressing 01/19/2021 1248 by Camillia Herter, RN Outcome: Progressing   Problem: Coping: Goal: Level of anxiety will decrease 01/19/2021 1248 by Camillia Herter, RN Outcome:  Progressing 01/19/2021 1248 by Camillia Herter, RN Outcome: Progressing   Problem: Elimination: Goal: Will not experience complications related to bowel motility 01/19/2021 1248 by Camillia Herter, RN Outcome: Progressing 01/19/2021 1248 by Camillia Herter, RN Outcome: Progressing Goal: Will not experience complications related to urinary retention 01/19/2021 1248 by Camillia Herter, RN Outcome: Progressing 01/19/2021 1248 by Camillia Herter, RN Outcome: Progressing   Problem: Pain Managment: Goal: General experience of comfort will improve 01/19/2021 1248 by Camillia Herter, RN Outcome: Progressing 01/19/2021 1248 by Camillia Herter, RN Outcome: Progressing   Problem: Safety: Goal: Ability to remain free from injury will improve 01/19/2021 1248 by Camillia Herter, RN Outcome: Progressing 01/19/2021 1248 by Camillia Herter, RN Outcome: Progressing   Problem: Skin Integrity: Goal: Risk for impaired skin integrity will decrease 01/19/2021 1248 by Camillia Herter, RN Outcome: Progressing 01/19/2021 1248 by Camillia Herter, RN Outcome: Progressing   Problem: Education: Goal: Knowledge of disease and its progression will improve Outcome: Progressing Goal: Individualized Educational Video(s) Outcome: Progressing   Problem: Fluid Volume: Goal: Compliance with measures to maintain balanced fluid volume will improve Outcome: Progressing   Problem: Health Behavior/Discharge Planning: Goal: Ability to manage health-related needs will improve Outcome: Progressing   Problem: Nutritional: Goal: Ability to make healthy dietary choices will improve Outcome: Progressing   Problem: Clinical Measurements: Goal: Complications related to the disease process, condition or treatment will be avoided or minimized Outcome: Progressing

## 2021-01-19 NOTE — Procedures (Signed)
Patient seen on Hemodialysis. BP (!) 161/115   Pulse (!) 25   Temp 97.8 F (36.6 C)   Resp (!) 27   Ht '5\' 7"'$  (1.702 m)   Wt 85 kg   SpO2 95%   BMI 29.35 kg/m   QB 400, UF goal 4L Tolerating treatment without complaints at this time.  Elmarie Shiley MD [facility] Office # [phone] Pager # [phone] 7:34 AM

## 2021-01-20 DIAGNOSIS — I161 Hypertensive emergency: Secondary | ICD-10-CM | POA: Diagnosis not present

## 2021-01-20 MED ORDER — LABETALOL HCL 200 MG PO TABS
400.0000 mg | ORAL_TABLET | Freq: Two times a day (BID) | ORAL | Status: DC
  Administered 2021-01-20: 400 mg via ORAL
  Filled 2021-01-20: qty 2

## 2021-01-20 MED ORDER — IRBESARTAN 150 MG PO TABS
300.0000 mg | ORAL_TABLET | Freq: Every day | ORAL | Status: DC
  Administered 2021-01-20: 300 mg via ORAL
  Filled 2021-01-20: qty 2

## 2021-01-20 MED ORDER — OLMESARTAN MEDOXOMIL 40 MG PO TABS: 40.0000 mg | ORAL_TABLET | Freq: Every day | ORAL | 0 refills | Status: DC

## 2021-01-20 MED ORDER — AMLODIPINE BESYLATE 10 MG PO TABS
10.0000 mg | ORAL_TABLET | Freq: Every day | ORAL | Status: DC
  Administered 2021-01-20: 10 mg via ORAL
  Filled 2021-01-20: qty 1

## 2021-01-20 MED ORDER — CLONIDINE HCL 0.2 MG PO TABS
0.3000 mg | ORAL_TABLET | Freq: Three times a day (TID) | ORAL | Status: DC
  Administered 2021-01-20 (×2): 0.3 mg via ORAL
  Filled 2021-01-20 (×2): qty 1

## 2021-01-20 NOTE — Discharge Instructions (Signed)
Cathy [name],  You were in the hospital because of difficulty breathing from pulmonary edema from blood pressure issues and missing HD. This improved with HD and better control of your blood pressure. You have been prescribed olmesartan for which you should start tomorrow, 01/21/2021.

## 2021-01-20 NOTE — Plan of Care (Signed)
Problem: Education: Goal: Knowledge of General Education information will improve Description: Including pain rating scale, medication(s)/side effects and non-pharmacologic comfort measures 01/20/2021 1826 by Camillia Herter, RN Outcome: Adequate for Discharge 01/20/2021 0804 by Camillia Herter, RN Outcome: Progressing   Problem: Health Behavior/Discharge Planning: Goal: Ability to manage health-related needs will improve 01/20/2021 1826 by Camillia Herter, RN Outcome: Adequate for Discharge 01/20/2021 0804 by Camillia Herter, RN Outcome: Progressing   Problem: Clinical Measurements: Goal: Ability to maintain clinical measurements within normal limits will improve 01/20/2021 1826 by Camillia Herter, RN Outcome: Adequate for Discharge 01/20/2021 0804 by Camillia Herter, RN Outcome: Progressing Goal: Will remain free from infection 01/20/2021 1826 by Camillia Herter, RN Outcome: Adequate for Discharge 01/20/2021 0804 by Camillia Herter, RN Outcome: Progressing Goal: Diagnostic test results will improve 01/20/2021 1826 by Camillia Herter, RN Outcome: Adequate for Discharge 01/20/2021 0804 by Camillia Herter, RN Outcome: Progressing Goal: Respiratory complications will improve 01/20/2021 1826 by Camillia Herter, RN Outcome: Adequate for Discharge 01/20/2021 0804 by Camillia Herter, RN Outcome: Progressing Goal: Cardiovascular complication will be avoided 01/20/2021 1826 by Camillia Herter, RN Outcome: Adequate for Discharge 01/20/2021 0804 by Camillia Herter, RN Outcome: Progressing   Problem: Activity: Goal: Risk for activity intolerance will decrease 01/20/2021 1826 by Camillia Herter, RN Outcome: Adequate for Discharge 01/20/2021 0804 by Camillia Herter, RN Outcome: Progressing   Problem: Nutrition: Goal: Adequate nutrition will be maintained 01/20/2021 1826 by Camillia Herter, RN Outcome: Adequate for Discharge 01/20/2021 0804 by Camillia Herter, RN Outcome: Progressing   Problem:  Coping: Goal: Level of anxiety will decrease 01/20/2021 1826 by Camillia Herter, RN Outcome: Adequate for Discharge 01/20/2021 0804 by Camillia Herter, RN Outcome: Progressing   Problem: Elimination: Goal: Will not experience complications related to bowel motility 01/20/2021 1826 by Camillia Herter, RN Outcome: Adequate for Discharge 01/20/2021 0804 by Camillia Herter, RN Outcome: Progressing Goal: Will not experience complications related to urinary retention 01/20/2021 1826 by Camillia Herter, RN Outcome: Adequate for Discharge 01/20/2021 0804 by Camillia Herter, RN Outcome: Progressing   Problem: Pain Managment: Goal: General experience of comfort will improve 01/20/2021 1826 by Camillia Herter, RN Outcome: Adequate for Discharge 01/20/2021 0804 by Camillia Herter, RN Outcome: Progressing   Problem: Safety: Goal: Ability to remain free from injury will improve 01/20/2021 1826 by Camillia Herter, RN Outcome: Adequate for Discharge 01/20/2021 0804 by Camillia Herter, RN Outcome: Progressing   Problem: Skin Integrity: Goal: Risk for impaired skin integrity will decrease 01/20/2021 1826 by Camillia Herter, RN Outcome: Adequate for Discharge 01/20/2021 0804 by Camillia Herter, RN Outcome: Progressing   Problem: Education: Goal: Knowledge of disease and its progression will improve 01/20/2021 1826 by Camillia Herter, RN Outcome: Adequate for Discharge 01/20/2021 0804 by Camillia Herter, RN Outcome: Progressing Goal: Individualized Educational Video(s) 01/20/2021 1826 by Camillia Herter, RN Outcome: Adequate for Discharge 01/20/2021 0804 by Camillia Herter, RN Outcome: Progressing   Problem: Fluid Volume: Goal: Compliance with measures to maintain balanced fluid volume will improve 01/20/2021 1826 by Camillia Herter, RN Outcome: Adequate for Discharge 01/20/2021 0804 by Camillia Herter, RN Outcome: Progressing   Problem: Health Behavior/Discharge Planning: Goal: Ability to manage health-related  needs will improve 01/20/2021 1826 by Camillia Herter, RN Outcome: Adequate for Discharge 01/20/2021 0804 by Camillia Herter, RN Outcome: Progressing   Problem: Nutritional: Goal: Ability  to make healthy dietary choices will improve 01/20/2021 1826 by Camillia Herter, RN Outcome: Adequate for Discharge 01/20/2021 0804 by Camillia Herter, RN Outcome: Progressing   Problem: Clinical Measurements: Goal: Complications related to the disease process, condition or treatment will be avoided or minimized 01/20/2021 1826 by Camillia Herter, RN Outcome: Adequate for Discharge 01/20/2021 0804 by Camillia Herter, RN Outcome: Progressing

## 2021-01-20 NOTE — Plan of Care (Signed)

## 2021-01-20 NOTE — Discharge Summary (Signed)
Physician Discharge Summary  Cathy [name] [id number] DOB: 18-May-1981 DOA: 01/18/2021  PCP: Patient, No Pcp Per  Admit date: 01/18/2021 Discharge date: 01/20/2021  Admitted From: Home Disposition: Home  Recommendations for Outpatient Follow-up:  1. Follow up with PCP in 1 week 2. Continued control of BP 3. Please follow up on the following pending results: None  Home Health: None Equipment/Devices: None  Discharge Condition: Stable CODE STATUS: Full code Diet recommendation: Renal diet   Brief/Interim Summary:  Admission HPI written by Etta Quill, DO   Chief Complaint: SOB and CP  HPI: Cathy [name] is a 40 y.o. 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  Pt reports missing tues dialysis but going on Thurs for "almost 5 hours".  This AM however she didn't take her BP meds at home "because they make me sleepy if I take them in the morning" so she has been trying to only take them once a day in the evening.  Had sudden onset severe SOB and CP while in car just PTA today.  EMS called for respiratory distress.  Symptoms are severe, persistent, nothing tried for symptoms.  No fever, chills, N/V/D, abd pain.   Hospital course:  Flash pulmonary edema Secondary to hypertension. Patient underwent HD urgently with resolution of symptoms.  Hypertensive emergency Secondary to patient missing doses prior to admission. Patient's home clonidine, amlodipine, labetalol resumed in addition to initiation of nitroglycerin drip. Olmesartan added per nephrology recommendations. Will need close follow-up for continued BP control.  ESRD on HD Secondary to lupus nephritis. Nephrology consulted and patient underwent HD on 2/19.  Lupus nephritis Continue prednisone.  Discharge Diagnoses:  Principal Problem:   Hypertensive emergency Active Problems:   Non compliance w medication regimen   Volume overload    Lupus nephritis (HCC)   Flash pulmonary edema (HCC)   ESRD (end stage renal disease) ([place])   Respiratory failure ([place])    Discharge Instructions   Allergies as of 01/20/2021   No Known Allergies     Medication List    STOP taking these medications   fenofibrate 160 MG tablet     TAKE these medications   albuterol 108 (90 Base) MCG/ACT inhaler Commonly known as: ProAir HFA Inhale 1-2 puffs into the lungs 2 (two) times daily.   amLODipine 10 MG tablet Commonly known as: NORVASC Take 1 tablet (10 mg total) by mouth daily.   cloNIDine 0.3 MG tablet Commonly known as: CATAPRES Take 1 tablet (0.3 mg total) by mouth 3 (three) times daily.   cyanocobalamin 100 MCG tablet Take 1 tablet (100 mcg total) by mouth daily.   folic acid 1 MG tablet Commonly known as: FOLVITE Take 1 tablet (1 mg total) by mouth daily.   guaiFENesin 600 MG 12 hr tablet Commonly known as: MUCINEX Take 1 tablet (600 mg total) by mouth 2 (two) times daily.   ipratropium 17 MCG/ACT inhaler Commonly known as: ATROVENT HFA Inhale 2 puffs into the lungs every 6 (six) hours.   labetalol 200 MG tablet Commonly known as: NORMODYNE Take 2 tablets (400 mg total) by mouth 2 (two) times daily.   olmesartan 40 MG tablet Commonly known as: BENICAR Take 1 tablet (40 mg total) by mouth daily.   pantoprazole 40 MG tablet Commonly known as: PROTONIX Take 1 tablet (40 mg total) by mouth daily.   predniSONE 20 MG tablet Commonly known as: DELTASONE Take 2 tablets (40 mg total)  by mouth daily with breakfast.   torsemide 20 MG tablet Commonly known as: DEMADEX Take 3 tablets (60 mg total) by mouth 2 (two) times daily.   Vitamin D3 25 MCG tablet Commonly known as: Vitamin D Take 1 tablet (1,000 Units total) by mouth daily.       No Known Allergies  Consultations:  Nephrology   Procedures/Studies: NM Pulmonary Perfusion  Result Date: 12/28/2020 CLINICAL DATA:  Shortness of breath.  COVID-19  positive EXAM: NUCLEAR MEDICINE PERFUSION LUNG SCAN TECHNIQUE: Perfusion images were obtained in multiple projections after intravenous injection of radiopharmaceutical. Views: Anterior, posterior, left lateral, right lateral, RPO, LPO, RAO, LAO. RADIOPHARMACEUTICALS:  4.4 mCi Tc-53mMAA IV COMPARISON:  Chest radiograph December 28, 2020. Perfusion lung scan December 24, 2020 FINDINGS: There is again noted relative decreased uptake in portions of the left lower lobe due to combination of cardiomegaly, consolidation, and pleural effusion. Appearance in the left lower lobe is stable compared to the previous study. Elsewhere, radiotracer uptake is normal. IMPRESSION: Stable appearance compared to lung scan from 4 days prior. Decreased uptake in portions of the left lower lobe ir stable, felt to be due to combination of infiltrate, pleural effusion cardiomegaly. Elsewhere, the distribution of radiotracer uptake is normal. This appearance is not felt to be indicative of pulmonary embolus based on perfusion only imaging. Electronically Signed   By: WLowella GripIII M.D.   On: 12/28/2020 15:37   NM Pulmonary Perfusion  Result Date: 12/24/2020 CLINICAL DATA:  End-stage renal disease, dyspnea, short of breath for 2 days, COVID-19 positive EXAM: NUCLEAR MEDICINE PERFUSION LUNG SCAN TECHNIQUE: Perfusion images were obtained in multiple projections after intravenous injection of radiopharmaceutical. Ventilation scans intentionally deferred if perfusion scan and chest x-ray adequate for interpretation during COVID 19 epidemic. RADIOPHARMACEUTICALS:  4.2 mCi Tc-948mAA IV COMPARISON:  12/23/2020 FINDINGS: Planar imaging the chest was performed during the perfusion examination. Decreased radiotracer uptake within the left lower hemithorax corresponds to cardiomegaly, pleural fluid, and left basilar consolidation seen on recent chest x-ray. There are no wedge-shaped perfusion defect to suggest pulmonary embolus. IMPRESSION:  1. No evidence of pulmonary embolism utilizing PISAPED criteria for perfusion only imaging. Decreased radiotracer in the lower left hemithorax corresponds to consolidation, effusion, and cardiomegaly seen on chest x-ray. Electronically Signed   By: [name] Ngo.D.   On: 12/24/2020 15:04   DG Chest Portable 1 View  Result Date: 01/18/2021 CLINICAL DATA:  Short of breath, respiratory distress EXAM: PORTABLE CHEST 1 VIEW COMPARISON:  12/28/2020 FINDINGS: Single frontal view of the chest demonstrates stable right internal jugular dialysis catheter. Cardiac silhouette is unremarkable. Progressive central vascular congestion with bibasilar consolidation and effusions, consistent with volume overload. No pneumothorax. IMPRESSION: 1. Worsening volume status, with increased edema and effusions. Electronically Signed   By: [name] Ngo.D.   On: 01/18/2021 20:05   DG CHEST PORT 1 VIEW  Result Date: 12/28/2020 CLINICAL DATA:  Chest pain.  COVID positive. EXAM: PORTABLE CHEST 1 VIEW COMPARISON:  Radiographs 12/23/2020 and 11/29/2020. FINDINGS: 0936 hours. Right IJ central venous catheter is unchanged at the level of the superior right atrium. The heart size and mediastinal contours are stable. Significant interval improvement in the pulmonary aeration with residual patchy airspace opacities at both lung bases and small bilateral pleural effusions. No definite residual edema or pneumothorax. The bones appear unremarkable. IMPRESSION: Significant interval improvement in pulmonary aeration with probable residual bibasilar atelectasis and small bilateral pleural effusions. Electronically Signed   By: WiRichardean Sale  M.D.   On: 12/28/2020 09:49   DG Chest Port 1 View  Result Date: 12/23/2020 CLINICAL DATA:  40 year old female with history of shortness of breath. EXAM: PORTABLE CHEST 1 VIEW COMPARISON:  Chest x-ray 11/29/2020. FINDINGS: Right internal jugular PermCath with tip terminating at the superior cavoatrial  junction. There is cephalization of the pulmonary vasculature, indistinctness of the interstitial markings, and patchy airspace disease throughout the lungs bilaterally suggestive of moderate pulmonary edema. Moderate bilateral pleural effusions. Bibasilar areas of atelectasis. Mild cardiomegaly. IMPRESSION: 1. The appearance of the chest is most suggestive of fluid overload and/or heart failure, as detailed above. 2. Bibasilar areas of atelectasis. Electronically Signed   By: Vinnie Langton M.D.   On: 12/23/2020 18:11   VAS [place] LOWER EXTREMITY VENOUS (DVT)  Result Date: 12/26/2020  Lower Venous DVT Study Indications: Edema.  Comparison Study: 09/27/18 previous Performing Technologist: Abram Sander RVS  Examination Guidelines: A complete evaluation includes B-mode imaging, spectral Doppler, color Doppler, and power Doppler as needed of all accessible portions of each vessel. Bilateral testing is considered an integral part of a complete examination. Limited examinations for reoccurring indications may be performed as noted. The reflux portion of the exam is performed with the patient in reverse Trendelenburg.  +---------+---------------+---------+-----------+----------+--------------+ RIGHT    CompressibilityPhasicitySpontaneityPropertiesThrombus Aging +---------+---------------+---------+-----------+----------+--------------+ CFV      Full           Yes      Yes                                 +---------+---------------+---------+-----------+----------+--------------+ SFJ      Full                                                        +---------+---------------+---------+-----------+----------+--------------+ FV Prox  Full                                                        +---------+---------------+---------+-----------+----------+--------------+ FV Mid   Full                                                         +---------+---------------+---------+-----------+----------+--------------+ FV DistalFull                                                        +---------+---------------+---------+-----------+----------+--------------+ PFV      Full                                                        +---------+---------------+---------+-----------+----------+--------------+ POP      Full  Yes      Yes                                 +---------+---------------+---------+-----------+----------+--------------+ PTV      Full                                                        +---------+---------------+---------+-----------+----------+--------------+ PERO     Full                                                        +---------+---------------+---------+-----------+----------+--------------+   +---------+---------------+---------+-----------+----------+--------------+ LEFT     CompressibilityPhasicitySpontaneityPropertiesThrombus Aging +---------+---------------+---------+-----------+----------+--------------+ CFV      Full           Yes      Yes                                 +---------+---------------+---------+-----------+----------+--------------+ SFJ      Full                                                        +---------+---------------+---------+-----------+----------+--------------+ FV Prox  Full                                                        +---------+---------------+---------+-----------+----------+--------------+ FV Mid   Full                                                        +---------+---------------+---------+-----------+----------+--------------+ FV DistalFull                                                        +---------+---------------+---------+-----------+----------+--------------+ PFV      Full                                                         +---------+---------------+---------+-----------+----------+--------------+ POP      Full           Yes      Yes                                 +---------+---------------+---------+-----------+----------+--------------+ PTV  Full                                                        +---------+---------------+---------+-----------+----------+--------------+ PERO     Full                                                        +---------+---------------+---------+-----------+----------+--------------+     Summary: BILATERAL: - No evidence of deep vein thrombosis seen in the lower extremities, bilaterally. - No evidence of superficial venous thrombosis in the lower extremities, bilaterally. -No evidence of popliteal cyst, bilaterally.   *See table(s) above for measurements and observations. Electronically signed by Harold Barban MD on 12/26/2020 at 9:40:35 PM.    Final        Subjective: Headache which is improved after stopping nitro drip  Discharge Exam: Vitals:   01/20/21 1100 01/20/21 1328  BP: (!) 146/102 (!) 141/102  Pulse: 74 75  Resp: 16   Temp: 98 F (36.7 C)   SpO2: 100% 100%   Vitals:   01/20/21 0721 01/20/21 0741 01/20/21 1100 01/20/21 1328  BP: (!) 174/117 (!) 157/105 (!) 146/102 (!) 141/102  Pulse:  80 74 75  Resp:  16 16   Temp:  97.8 F (36.6 C) 98 F (36.7 C)   TempSrc:  Oral Oral   SpO2:  100% 100% 100%  Weight:      Height:        General: Pt is alert, awake, not in acute distress Cardiovascular: RRR, S1/S2 +, no rubs, no gallops Respiratory: CTA bilaterally, no wheezing, no rhonchi Abdominal: Soft, NT, ND, bowel sounds + Extremities: BLE 2+ pitting edema, no cyanosis    The results of significant diagnostics from this hospitalization (including imaging, microbiology, ancillary and laboratory) are listed below for reference.     Microbiology: Recent Results (from the past 240 hour(s))  Resp Panel by RT-PCR (Flu A&B, Covid)      Status: None   Collection Time: 01/18/21  8:18 PM  Result Value Ref Range Status   SARS Coronavirus 2 by RT PCR NEGATIVE NEGATIVE Final    Comment: (NOTE) SARS-CoV-2 target nucleic acids are NOT DETECTED.  The SARS-CoV-2 RNA is generally detectable in upper respiratory specimens during the acute phase of infection. The lowest concentration of SARS-CoV-2 viral copies this assay can detect is 138 copies/mL. A negative result does not preclude SARS-Cov-2 infection and should not be used as the sole basis for treatment or other patient management decisions. A negative result may occur with  improper specimen collection/handling, submission of specimen other than nasopharyngeal swab, presence of viral mutation(s) within the areas targeted by this assay, and inadequate number of viral copies(<138 copies/mL). A negative result must be combined with clinical observations, patient history, and epidemiological information. The expected result is Negative.  Fact Sheet for Patients:  EntrepreneurPulse.com.au  Fact Sheet for Healthcare Providers:  IncredibleEmployment.be  This test is no t yet approved or cleared by the [place] FDA and  has been authorized for detection and/or diagnosis of SARS-CoV-2 by FDA under an Emergency Use Authorization (EUA). This EUA will remain  in effect (meaning this test can be used)  for the duration of the COVID-19 declaration under Section 564(b)(1) of the Act, 21 U.S.C.section 360bbb-3(b)(1), unless the authorization is terminated  or revoked sooner.       Influenza A by PCR NEGATIVE NEGATIVE Final   Influenza B by PCR NEGATIVE NEGATIVE Final    Comment: (NOTE) The Xpert Xpress SARS-CoV-2/FLU/RSV plus assay is intended as an aid in the diagnosis of influenza from Nasopharyngeal swab specimens and should not be used as a sole basis for treatment. Nasal washings and aspirates are unacceptable for Xpert Xpress  SARS-CoV-2/FLU/RSV testing.  Fact Sheet for Patients: EntrepreneurPulse.com.au  Fact Sheet for Healthcare Providers: IncredibleEmployment.be  This test is not yet approved or cleared by the [place] FDA and has been authorized for detection and/or diagnosis of SARS-CoV-2 by FDA under an Emergency Use Authorization (EUA). This EUA will remain in effect (meaning this test can be used) for the duration of the COVID-19 declaration under Section 564(b)(1) of the Act, 21 U.S.C. section 360bbb-3(b)(1), unless the authorization is terminated or revoked.  Performed at [facility], [place] [address] [place], [place] [address]      Labs: BNP (last 3 results) Recent Labs    12/23/20 1744 12/23/20 2030 01/18/21 1952  BNP >4,500.0* >4,500.0* [id number]*   Basic Metabolic Panel: Recent Labs  Lab 01/14/21 1414 01/18/21 1952 01/18/21 2007 01/18/21 2111 01/19/21 1337  NA 141 141 139 139 138  K 5.0 4.5 4.6 4.5 3.9  CL 105 105  --   --  101  CO2 19* 22  --   --  25  GLUCOSE 179* 154*  --   --  140*  BUN 77* 59*  --   --  34*  CREATININE 6.98* 6.03*  --   --  4.35*  CALCIUM 6.8* 7.0*  --   --  7.4*   Liver Function Tests: Recent Labs  Lab 01/14/21 1414 01/18/21 1952  AST 22 151*  ALT 34 109*  ALKPHOS 97 187*  BILITOT 0.5 0.8  PROT 6.0* 5.7*  ALBUMIN 2.6* 2.5*   No results for input(s): LIPASE, AMYLASE in the last 168 hours. No results for input(s): AMMONIA in the last 168 hours. CBC: Recent Labs  Lab 01/14/21 1414 01/18/21 1952 01/18/21 2007 01/18/21 2111 01/19/21 1337  WBC 7.0 14.0*  --   --  7.0  NEUTROABS 6.2 9.1*  --   --   --   HGB 7.8* 8.4* 8.5* 7.8* 8.5*  HCT 24.6* 27.8* 25.0* 23.0* 26.6*  MCV 92.1 95.5  --   --  89.6  PLT 152 201  --   --  176   Cardiac Enzymes: No results for input(s): CKTOTAL, CKMB, CKMBINDEX, TROPONINI in the last 168 hours. BNP: Invalid input(s): POCBNP CBG: No results for input(s):  GLUCAP in the last 168 hours. D-Dimer No results for input(s): DDIMER in the last 72 hours. Hgb A1c No results for input(s): HGBA1C in the last 72 hours. Lipid Profile No results for input(s): CHOL, HDL, LDLCALC, TRIG, CHOLHDL, LDLDIRECT in the last 72 hours. Thyroid function studies No results for input(s): TSH, T4TOTAL, T3FREE, THYROIDAB in the last 72 hours.  Invalid input(s): FREET3 Anemia work up No results for input(s): VITAMINB12, FOLATE, FERRITIN, TIBC, IRON, RETICCTPCT in the last 72 hours. Urinalysis    Component Value Date/Time   COLORURINE YELLOW 11/29/2020 2054   APPEARANCEUR HAZY (A) 11/29/2020 2054   LABSPEC 1.015 11/29/2020 2054   PHURINE 6.0 11/29/2020 2054   [place] 11/29/2020 2054  HGBUR MODERATE (A) 11/29/2020 2054   BILIRUBINUR NEGATIVE 11/29/2020 2054   KETONESUR NEGATIVE 11/29/2020 2054   PROTEINUR >=300 (A) 11/29/2020 2054   UROBILINOGEN 0.2 10/17/2020 1026   NITRITE NEGATIVE 11/29/2020 2054   LEUKOCYTESUR NEGATIVE 11/29/2020 2054   Sepsis Labs Invalid input(s): PROCALCITONIN,  WBC,  LACTICIDVEN Microbiology Recent Results (from the past 240 hour(s))  Resp Panel by RT-PCR (Flu A&B, Covid)     Status: None   Collection Time: 01/18/21  8:18 PM  Result Value Ref Range Status   SARS Coronavirus 2 by RT PCR NEGATIVE NEGATIVE Final    Comment: (NOTE) SARS-CoV-2 target nucleic acids are NOT DETECTED.  The SARS-CoV-2 RNA is generally detectable in upper respiratory specimens during the acute phase of infection. The lowest concentration of SARS-CoV-2 viral copies this assay can detect is 138 copies/mL. A negative result does not preclude SARS-Cov-2 infection and should not be used as the sole basis for treatment or other patient management decisions. A negative result may occur with  improper specimen collection/handling, submission of specimen other than nasopharyngeal swab, presence of viral mutation(s) within the areas targeted by this  assay, and inadequate number of viral copies(<138 copies/mL). A negative result must be combined with clinical observations, patient history, and epidemiological information. The expected result is Negative.  Fact Sheet for Patients:  EntrepreneurPulse.com.au  Fact Sheet for Healthcare Providers:  IncredibleEmployment.be  This test is no t yet approved or cleared by the [place] FDA and  has been authorized for detection and/or diagnosis of SARS-CoV-2 by FDA under an Emergency Use Authorization (EUA). This EUA will remain  in effect (meaning this test can be used) for the duration of the COVID-19 declaration under Section 564(b)(1) of the Act, 21 U.S.C.section 360bbb-3(b)(1), unless the authorization is terminated  or revoked sooner.       Influenza A by PCR NEGATIVE NEGATIVE Final   Influenza B by PCR NEGATIVE NEGATIVE Final    Comment: (NOTE) The Xpert Xpress SARS-CoV-2/FLU/RSV plus assay is intended as an aid in the diagnosis of influenza from Nasopharyngeal swab specimens and should not be used as a sole basis for treatment. Nasal washings and aspirates are unacceptable for Xpert Xpress SARS-CoV-2/FLU/RSV testing.  Fact Sheet for Patients: EntrepreneurPulse.com.au  Fact Sheet for Healthcare Providers: IncredibleEmployment.be  This test is not yet approved or cleared by the [place] FDA and has been authorized for detection and/or diagnosis of SARS-CoV-2 by FDA under an Emergency Use Authorization (EUA). This EUA will remain in effect (meaning this test can be used) for the duration of the COVID-19 declaration under Section 564(b)(1) of the Act, 21 U.S.C. section 360bbb-3(b)(1), unless the authorization is terminated or revoked.  Performed at [facility], [place] [address], [place], [place] [address]      Time coordinating discharge: 35 minutes  SIGNED:   Cordelia Poche,  MD [facility] 01/20/2021, 1:37 PM

## 2021-01-20 NOTE — Progress Notes (Signed)
[facility] Progress Note   Subjective:  Urgent dialysis yesterday with net UF 4L Feels much better this am. No SOB. CP improved.  Says she might go home today.   Objective Vitals:   01/20/21 0340 01/20/21 0500 01/20/21 0721 01/20/21 0741  BP: (!) 160/104 (!) 150/94 (!) 174/117 (!) 157/105  Pulse: 72 71  80  Resp: 16   16  Temp:  (!) 97 F (36.1 C)    TempSrc:  Oral    SpO2: 100% 100%  100%  Weight:  80.5 kg    Height:         Additional Objective Labs: Basic Metabolic Panel: Recent Labs  Lab 01/14/21 1414 01/18/21 1952 01/18/21 2007 01/18/21 2111 01/19/21 1337  NA 141 141 139 139 138  K 5.0 4.5 4.6 4.5 3.9  CL 105 105  --   --  101  CO2 19* 22  --   --  25  GLUCOSE 179* 154*  --   --  140*  BUN 77* 59*  --   --  34*  CREATININE 6.98* 6.03*  --   --  4.35*  CALCIUM 6.8* 7.0*  --   --  7.4*   CBC: Recent Labs  Lab 01/14/21 1414 01/18/21 1952 01/18/21 2007 01/18/21 2111 01/19/21 1337  WBC 7.0 14.0*  --   --  7.0  NEUTROABS 6.2 9.1*  --   --   --   HGB 7.8* 8.4* 8.5* 7.8* 8.5*  HCT 24.6* 27.8* 25.0* 23.0* 26.6*  MCV 92.1 95.5  --   --  89.6  PLT 152 201  --   --  176   Blood Culture    Component Value Date/Time   SDES BLOOD RIGHT ANTECUBITAL 12/10/2020 1658   SPECREQUEST  12/10/2020 1658    BOTTLES DRAWN AEROBIC AND ANAEROBIC Blood Culture adequate volume   CULT  12/10/2020 1658    NO GROWTH 5 DAYS Performed at [facility], [place] [address], [place], [place] [address]    REPTSTATUS 12/15/2020 FINAL 12/10/2020 1658     Physical Exam General:  Well appearing, nad  Heart: RRR Lungs: clear bilaterally  Abdomen: soft non-tender  Extremities: 2+ LE edema bilaterally  Dialysis Access: R IJ TDC   Medications: . sodium chloride    . sodium chloride     . albuterol  1-2 puff Inhalation BID  . amLODipine  10 mg Oral Daily  . Chlorhexidine Gluconate Cloth  6 each Topical Q0600  . cloNIDine  0.3 mg Oral TID  . fenofibrate   160 mg Oral Daily  . folic acid  1 mg Oral Daily  . heparin  5,000 Units Subcutaneous Q8H  . ipratropium  2 puff Inhalation TID  . labetalol  400 mg Oral BID  . pantoprazole  40 mg Oral Daily  . predniSONE  40 mg Oral Q breakfast  . torsemide  60 mg Oral BID  . cyanocobalamin  100 mcg Oral Daily    Dialysis Orders:  [facility], TTS, designated acute kidney injury, right IJ TDC, EDW 83.5 kg, 180 dialyzer, 4 hours, BFR 400/DFR 800, 3K/2.5 calcium, Mircera 100 mcg IV every 2 weeks  Assessment/Plan: Acute hypoxic respiratory failure: Secondary to volume overload/flash pulmonary edema and will be treated with hemodialysis. Had urgent dialysis 2/19.  We will challenge her dry weight which will need to be aggressively lowered based on significant edema that she has.  Her spotty adherence with outpatient dialysis is likely  culpable for this. Plan for lower EDW at discharge.  2.  Hypertensive emergency: Secondary to suboptimal dialysis/volume overload as well as suboptimal adherence with her outpatient antihypertensive therapy.  Transitioned back to her oral antihypertensive medications and will prescribe ultrafiltration with dialysis. 3.  End-stage renal disease: With prolonged dialysis dependent acute kidney injury and no clear evidence of renal recovery based on labs.  No urgent dialysis needs today. Usual HD TTS.  4.  Anemia: Secondary to chronic kidney disease, no overt blood loss and will continue ESA. 5.  Systemic lupus erythematosus: Previously treated with cyclophosphamide and with ongoing prednisone. 6.  Secondary hyperparathyroidism: Continue to follow calcium/phosphorus trend to decide on need for phosphorus binder  Lynnda Child PA-C [place] 01/20/2021,10:49 AM

## 2021-01-21 ENCOUNTER — Ambulatory Visit: Payer: Medicaid Other | Admitting: Family Medicine

## 2021-01-21 ENCOUNTER — Telehealth: Payer: Self-pay | Admitting: Nephrology

## 2021-01-21 NOTE — Telephone Encounter (Signed)
Transition of care contact from inpatient facility  Date of discharge: 01/20/21  Date of contact: 01/21/21 Method: Phone Spoke to: Patient  Patient contacted to discuss transition of care from recent inpatient hospitalization. Patient was admitted to [facility] from 2/18-2/20/22 with discharge diagnosis of flash pulmonary edema/Hypertensive emergency   Medication changes were reviewed.  Patient will follow up with his/her outpatient HD unit on: Tuesday 2/22. No other follow up needs at this time.

## 2021-01-23 ENCOUNTER — Ambulatory Visit: Payer: Medicaid Other | Admitting: Internal Medicine

## 2021-01-29 DIAGNOSIS — U071 COVID-19: Secondary | ICD-10-CM | POA: Diagnosis not present

## 2021-01-29 DIAGNOSIS — J9690 Respiratory failure, unspecified, unspecified whether with hypoxia or hypercapnia: Secondary | ICD-10-CM | POA: Diagnosis not present

## 2021-01-29 DIAGNOSIS — I509 Heart failure, unspecified: Secondary | ICD-10-CM | POA: Diagnosis not present

## 2021-02-12 ENCOUNTER — Ambulatory Visit: Payer: Self-pay | Admitting: Family Medicine

## 2021-02-18 ENCOUNTER — Other Ambulatory Visit: Payer: Self-pay

## 2021-02-18 ENCOUNTER — Ambulatory Visit (INDEPENDENT_AMBULATORY_CARE_PROVIDER_SITE_OTHER): Payer: Medicaid Other | Admitting: Family Medicine

## 2021-02-18 ENCOUNTER — Encounter: Payer: Self-pay | Admitting: Family Medicine

## 2021-02-18 VITALS — BP 177/120 | HR 67 | Temp 98.6°F | Ht 67.0 in

## 2021-02-18 DIAGNOSIS — Z7952 Long term (current) use of systemic steroids: Secondary | ICD-10-CM

## 2021-02-18 DIAGNOSIS — I129 Hypertensive chronic kidney disease with stage 1 through stage 4 chronic kidney disease, or unspecified chronic kidney disease: Secondary | ICD-10-CM

## 2021-02-18 DIAGNOSIS — N186 End stage renal disease: Secondary | ICD-10-CM

## 2021-02-18 DIAGNOSIS — Z7689 Persons encountering health services in other specified circumstances: Secondary | ICD-10-CM

## 2021-02-18 DIAGNOSIS — Z992 Dependence on renal dialysis: Secondary | ICD-10-CM

## 2021-02-18 DIAGNOSIS — I160 Hypertensive urgency: Secondary | ICD-10-CM | POA: Diagnosis not present

## 2021-02-18 DIAGNOSIS — Z9981 Dependence on supplemental oxygen: Secondary | ICD-10-CM

## 2021-02-18 DIAGNOSIS — G894 Chronic pain syndrome: Secondary | ICD-10-CM

## 2021-02-18 DIAGNOSIS — M329 Systemic lupus erythematosus, unspecified: Secondary | ICD-10-CM

## 2021-02-18 DIAGNOSIS — F3181 Bipolar II disorder: Secondary | ICD-10-CM

## 2021-02-18 DIAGNOSIS — IMO0002 Reserved for concepts with insufficient information to code with codable children: Secondary | ICD-10-CM

## 2021-02-18 DIAGNOSIS — Z09 Encounter for follow-up examination after completed treatment for conditions other than malignant neoplasm: Secondary | ICD-10-CM

## 2021-02-18 DIAGNOSIS — F32A Depression, unspecified: Secondary | ICD-10-CM

## 2021-02-18 NOTE — Progress Notes (Signed)
[facility]  Re-establish Care  Subjective:  Patient ID: Cathy [name], female    DOB: 1981-06-03  Age: 40 y.o. MRN: [id number]  CC:  Chief Complaint  Patient presents with  . New Patient (Initial Visit)  . Pre-visit Screening Tool Documentation    Nasal congestion , covid neg     HPI TAYLEA LATHON is a 40 year old female who presents to Re-establish Care.   Patient Active Problem List   Diagnosis Date Noted  . Encounter for antineoplastic chemotherapy 01/07/2021  . Respiratory failure ([place]) 12/24/2020  . Flash pulmonary edema ([place]) 12/23/2020  . Hypertensive emergency 12/23/2020  . CHF exacerbation ([place]) 12/23/2020  . ESRD (end stage renal disease) ([place]) 12/23/2020  . Elevated troponin 12/23/2020  . COVID-19 virus infection 12/23/2020  . 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 Volume overload 11/29/2020  . Hyperkalemia 11/29/2020  . Pericardial effusion   . Aortic atherosclerosis ([place]) 11/07/2020  . Anasarca 11/07/2020  . Abdominal pain 11/07/2020  . Diarrhea 11/07/2020  . 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 Tubal ligation evaluation 02/03/2019  . Renal hypertension 02/02/2019  . Pulmonary edema 02/01/2019  . AMA (advanced maternal age) multigravida 35+ 01/12/2019  . Non compliance w medication regimen 01/12/2019  . Current chronic use of systemic steroids 01/12/2019  . Unwanted fertility 12/10/2018  . Anemia 10/12/2018  . Supervision of high risk pregnancy, antepartum 10/04/2018  . Lupus ([place]) 09/20/2018  . Supervision of pregnancy with grand multiparity in second trimester 09/20/2018  . Substance abuse ([place]) 09/20/2018  . Acute renal failure (ARF) ([place]) 09/20/2018  . MDD (major depressive disorder) 10/11/2015  . Asthma 09/22/2011   Current Status: Since her last office visit, she has been lost to care with our office. Her last office visit was on 07/28/2018. Her blood  pressures are elevated today. She states that she has not taken her blood pressure medications as of yet today. She is currently a dialysis patient and receives treatments on Tues, Thurs., and Saturdays. She has been on treatment since 10/2020 now. Her next treatment is tomorrow. She states that Clonidine makes her very lethargic. Her blood pressure readings are elevated today.  She denies visual changes, chest pain, cough, shortness of breath, heart palpitations, and falls. She has occasional headaches and dizziness with position changes. Denies severe headaches, confusion, seizures, double vision, and blurred vision, nausea and vomiting. She denies fevers, chills, recent infections, weight loss, and night sweats. Denies GI problems such as diarrhea, and constipation. She has no reports of blood in stools. No depression or anxiety reported today. She is taing all medications as prescribed. She denies pain today.   Past Medical History:  Diagnosis Date  . Anxiety   . Asthma   . Bipolar depression ([place])   . Depression   . ESRD (end stage renal disease) on dialysis ([place]) 10/2020  . Gonorrhea   . Lupus ([place])   . Pelvic inflammatory disease (PID)   . Renal hypertension   . Trichomonas infection     Past Surgical History:  Procedure Laterality Date  . IR FLUORO GUIDE CV LINE RIGHT  11/30/2020  . IR US GUIDE VASC ACCESS RIGHT  11/30/2020  . RENAL BIOPSY    . TUBAL LIGATION N/A 02/03/2019   Procedure: POST PARTUM TUBAL LIGATION;  Surgeon: Aletha Halim, MD;  Location: [facility] LD ORS;  Service: Gynecology;  Laterality: N/A;    Family History  Problem Relation  Age of Onset  . Diabetes Maternal Grandmother   . Hypertension Maternal Grandmother   . Diabetes Maternal Grandfather   . Hypertension Maternal Grandfather   . Diabetes Paternal Grandmother   . Hypertension Paternal Grandmother   . Diabetes Paternal Grandfather   . Hypertension Paternal Grandfather     Social History   Socioeconomic  History  . Marital status: Single    Spouse name: Not on file  . Number of children: Not on file  . Years of education: Not on file  . Highest education level: Not on file  Occupational History  . Not on file  Tobacco Use  . Smoking status: Former Smoker    Packs/day: 0.25    Types: Cigarettes    Quit date: 10/08/2020    Years since quitting: 0.3  . Smokeless tobacco: Never Used  Vaping Use  . Vaping Use: Never used  Substance and Sexual Activity  . Alcohol use: Not Currently  . Drug use: Not Currently    Types: Marijuana    Comment: 3 times a week   . Sexual activity: Not Currently    Birth control/protection: None  Other Topics Concern  . Not on file  Social History Narrative  . Not on file   Social Determinants of Health   Financial Resource Strain: Not on file  Food Insecurity: Not on file  Transportation Needs: Not on file  Physical Activity: Not on file  Stress: Not on file  Social Connections: Not on file  Intimate Partner Violence: Not on file    Outpatient Medications Prior to Visit  Medication Sig Dispense Refill  . albuterol (PROAIR HFA) 108 (90 Base) MCG/ACT inhaler Inhale 1-2 puffs into the lungs 2 (two) times daily. 1 each 2  . amLODipine (NORVASC) 10 MG tablet Take 1 tablet (10 mg total) by mouth daily. 30 tablet 2  . cloNIDine (CATAPRES) 0.3 MG tablet Take 1 tablet (0.3 mg total) by mouth 3 (three) times daily. 90 tablet 2  . guaiFENesin (MUCINEX) 600 MG 12 hr tablet Take 1 tablet (600 mg total) by mouth 2 (two) times daily. 30 tablet 0  . ipratropium (ATROVENT HFA) 17 MCG/ACT inhaler Inhale 2 puffs into the lungs every 6 (six) hours. 1 each 12  . labetalol (NORMODYNE) 200 MG tablet Take 2 tablets (400 mg total) by mouth 2 (two) times daily. 60 tablet 3  . pantoprazole (PROTONIX) 40 MG tablet Take 1 tablet (40 mg total) by mouth daily. 30 tablet 1  . predniSONE (DELTASONE) 20 MG tablet Take 2 tablets (40 mg total) by mouth daily with breakfast. 60  tablet 3  . torsemide (DEMADEX) 20 MG tablet Take 3 tablets (60 mg total) by mouth 2 (two) times daily. 60 tablet 2  . cyanocobalamin 100 MCG tablet Take 1 tablet (100 mcg total) by mouth daily. (Patient not taking: Reported on 02/18/2021) 30 tablet 1  . folic acid (FOLVITE) 1 MG tablet Take 1 tablet (1 mg total) by mouth daily. (Patient not taking: Reported on 02/18/2021) 30 tablet 1  . olmesartan (BENICAR) 40 MG tablet Take 1 tablet (40 mg total) by mouth daily. (Patient not taking: Reported on 02/18/2021) 90 tablet 0  . Vitamin D3 (VITAMIN D) 25 MCG tablet Take 1 tablet (1,000 Units total) by mouth daily. (Patient not taking: Reported on 02/18/2021) 30 tablet 0   No facility-administered medications prior to visit.    No Known Allergies  ROS Review of Systems  Constitutional: Negative.   HENT: Negative.  Eyes: Negative.   Respiratory: Negative.   Cardiovascular: Positive for leg swelling (bilateral edema).  Gastrointestinal: Negative.   Endocrine: Negative.   Genitourinary:       Dialysis patient  Musculoskeletal: Positive for gait problem.  Skin: Negative.   Allergic/Immunologic: Negative.   Neurological: Positive for dizziness (occasional), weakness and headaches (occasional ).  Hematological: Negative.   Psychiatric/Behavioral: Negative.       Objective:    Physical Exam Vitals and nursing note reviewed.  Constitutional:      Appearance: Normal appearance.  HENT:     Head: Normocephalic and atraumatic.     Nose: Nose normal.     Mouth/Throat:     Mouth: Mucous membranes are moist.     Pharynx: Oropharynx is clear.  Cardiovascular:     Rate and Rhythm: Normal rate and regular rhythm.     Pulses: Normal pulses.     Heart sounds: Normal heart sounds.  Pulmonary:     Effort: Pulmonary effort is normal.     Breath sounds: Wheezing (all lobes) and rhonchi (all lobes) present.  Abdominal:     General: Bowel sounds are normal.     Palpations: Abdomen is soft.   Musculoskeletal:        General: Normal range of motion.     Cervical back: Normal range of motion and neck supple.     Right lower leg: Edema present.     Left lower leg: Edema present.  Skin:    General: Skin is warm and dry.       Neurological:     General: No focal deficit present.     Mental Status: She is alert and oriented to person, place, and time.  Psychiatric:        Mood and Affect: Mood normal.        Behavior: Behavior normal.        Thought Content: Thought content normal.        Judgment: Judgment normal.     BP (!) 177/120   Pulse 67   Temp 98.6 F (37 C) (Temporal)   Ht '5\' 7"'$  (1.702 m)   SpO2 99%   BMI 27.80 kg/m  Wt Readings from Last 3 Encounters:  01/20/21 177 lb 7.5 oz (80.5 kg)  12/26/20 187 lb 6.3 oz (85 kg)  12/13/20 189 lb 9.5 oz (86 kg)     Health Maintenance Due  Topic Date Due  . COVID-19 Vaccine (1) Never done  . INFLUENZA VACCINE  07/01/2020  . PAP SMEAR-Modifier  12/31/2020    There are no preventive care reminders to display for this patient.  Lab Results  Component Value Date   TSH 2.355 11/07/2020   Lab Results  Component Value Date   WBC 7.0 01/19/2021   HGB 8.5 (L) 01/19/2021   HCT 26.6 (L) 01/19/2021   MCV 89.6 01/19/2021   PLT 176 01/19/2021   Lab Results  Component Value Date   NA 138 01/19/2021   K 3.9 01/19/2021   CO2 25 01/19/2021   GLUCOSE 140 (H) 01/19/2021   BUN 34 (H) 01/19/2021   CREATININE 4.35 (H) 01/19/2021   BILITOT 0.8 01/18/2021   ALKPHOS 187 (H) 01/18/2021   AST 151 (H) 01/18/2021   ALT 109 (H) 01/18/2021   PROT 5.7 (L) 01/18/2021   ALBUMIN 2.5 (L) 01/18/2021   CALCIUM 7.4 (L) 01/19/2021   ANIONGAP 12 01/19/2021   Lab Results  Component Value Date   CHOL 299 (H) 11/12/2020  Lab Results  Component Value Date   HDL 23 (L) 11/12/2020   Lab Results  Component Value Date   LDLCALC UNABLE TO CALCULATE IF TRIGLYCERIDE OVER 400 mg/dL 11/12/2020   Lab Results  Component Value Date    TRIG 568 (H) 11/12/2020   Lab Results  Component Value Date   CHOLHDL 13.0 11/12/2020   No results found for: HGBA1C    Assessment & Plan:   1. Encounter to re-establish care  2. Hypertensive urgency Blood pressures are elevated today. We referred her to ED via ambulance at this time. Patient refused and signed AMA form at discharge. She states that she will follow up with Nephrology tomorrow. She denies severe headaches, confusion, seizures, double vision, and blurred vision, nausea and vomiting. She will report to ED if she experiences these symptoms. Patient verbalized understanding.    3. Has received maintenance renal dialysis treatments ([place])  4. ESRD (end stage renal disease) ([place])  5. Renal hypertension Patient has follow up with Nephrology at dialysis center on tomorrow.   6. Lupus ([place])  7. Chronic pain syndrome - Ambulatory referral to Pain Clinic  8. Current chronic use of systemic steroids  9. Bipolar 2 disorder ([place]) - Ambulatory referral to Psychiatry  10. Depression, unspecified depression type - Ambulatory referral to Psychiatry  11. Oxygen dependent  12. Follow up She will follow up in 8 weeks.  No orders of the defined types were placed in this encounter.   Orders Placed This Encounter  Procedures  . Ambulatory referral to Pain Clinic  . Ambulatory referral to Psychiatry     Referral Orders     Ambulatory referral to Pain Clinic     Ambulatory referral to Psychiatry   Kathe Becton, MSN, ANE, FNP-BC [facility] [address] [place], [place] [address] [phone] fax  Problem List Items Addressed This Visit      Cardiovascular and Mediastinum   Renal hypertension     Genitourinary   ESRD (end stage renal disease) ([place])     Other   Current chronic use of systemic steroids   Lupus ([place])    Other Visit Diagnoses    Encounter  to establish care    -  Primary   Hypertensive urgency       Has received maintenance renal dialysis treatments ([place])       Chronic pain syndrome       Relevant Orders   Ambulatory referral to Pain Clinic   Bipolar 2 disorder [facility])       Relevant Orders   Ambulatory referral to Psychiatry   Depression, unspecified depression type       Relevant Orders   Ambulatory referral to Psychiatry   Oxygen dependent       Follow up          No orders of the defined types were placed in this encounter.   Follow-up: No follow-ups on file.    Azzie Glatter, FNP

## 2021-02-26 ENCOUNTER — Encounter: Payer: Self-pay | Admitting: Family Medicine

## 2021-03-01 DIAGNOSIS — I509 Heart failure, unspecified: Secondary | ICD-10-CM | POA: Diagnosis not present

## 2021-03-01 DIAGNOSIS — U071 COVID-19: Secondary | ICD-10-CM | POA: Diagnosis not present

## 2021-03-01 DIAGNOSIS — J9690 Respiratory failure, unspecified, unspecified whether with hypoxia or hypercapnia: Secondary | ICD-10-CM | POA: Diagnosis not present

## 2021-03-12 DIAGNOSIS — N041 Nephrotic syndrome with focal and segmental glomerular lesions: Secondary | ICD-10-CM | POA: Insufficient documentation

## 2021-03-31 DIAGNOSIS — I509 Heart failure, unspecified: Secondary | ICD-10-CM | POA: Diagnosis not present

## 2021-03-31 DIAGNOSIS — U071 COVID-19: Secondary | ICD-10-CM | POA: Diagnosis not present

## 2021-03-31 DIAGNOSIS — J9690 Respiratory failure, unspecified, unspecified whether with hypoxia or hypercapnia: Secondary | ICD-10-CM | POA: Diagnosis not present

## 2021-04-15 ENCOUNTER — Other Ambulatory Visit: Payer: Self-pay

## 2021-04-15 DIAGNOSIS — N186 End stage renal disease: Secondary | ICD-10-CM

## 2021-04-24 ENCOUNTER — Ambulatory Visit (HOSPITAL_COMMUNITY): Payer: Medicaid Other | Admitting: Licensed Clinical Social Worker

## 2021-05-01 DIAGNOSIS — J9690 Respiratory failure, unspecified, unspecified whether with hypoxia or hypercapnia: Secondary | ICD-10-CM | POA: Diagnosis not present

## 2021-05-01 DIAGNOSIS — U071 COVID-19: Secondary | ICD-10-CM | POA: Diagnosis not present

## 2021-05-01 DIAGNOSIS — I509 Heart failure, unspecified: Secondary | ICD-10-CM | POA: Diagnosis not present

## 2021-05-07 ENCOUNTER — Ambulatory Visit (HOSPITAL_COMMUNITY): Payer: Medicaid Other | Admitting: Psychiatry

## 2021-05-17 ENCOUNTER — Ambulatory Visit (HOSPITAL_COMMUNITY): Payer: Medicaid Other

## 2021-05-17 ENCOUNTER — Encounter: Payer: Medicaid Other | Admitting: Vascular Surgery

## 2021-05-31 ENCOUNTER — Encounter: Payer: Self-pay | Admitting: Hematology and Oncology

## 2021-05-31 DIAGNOSIS — Z992 Dependence on renal dialysis: Secondary | ICD-10-CM | POA: Diagnosis not present

## 2021-05-31 DIAGNOSIS — N186 End stage renal disease: Secondary | ICD-10-CM | POA: Diagnosis not present

## 2021-05-31 DIAGNOSIS — M3214 Glomerular disease in systemic lupus erythematosus: Secondary | ICD-10-CM | POA: Diagnosis not present

## 2021-06-05 ENCOUNTER — Other Ambulatory Visit: Payer: Self-pay

## 2021-06-05 ENCOUNTER — Encounter: Payer: Self-pay | Admitting: Vascular Surgery

## 2021-06-05 ENCOUNTER — Ambulatory Visit (INDEPENDENT_AMBULATORY_CARE_PROVIDER_SITE_OTHER)
Admission: RE | Admit: 2021-06-05 | Discharge: 2021-06-05 | Disposition: A | Discharge status: Home / Self Care | Payer: Medicare Other | Source: Ambulatory Visit | Attending: Vascular Surgery | Admitting: Vascular Surgery

## 2021-06-05 ENCOUNTER — Ambulatory Visit (INDEPENDENT_AMBULATORY_CARE_PROVIDER_SITE_OTHER): Payer: Medicare Other | Admitting: Vascular Surgery

## 2021-06-05 VITALS — BP 110/76 | HR 51 | Temp 97.3°F | Resp 20 | Ht 67.0 in | Wt 147.0 lb

## 2021-06-05 DIAGNOSIS — R9431 Abnormal electrocardiogram [ECG] [EKG]: Secondary | ICD-10-CM | POA: Diagnosis not present

## 2021-06-05 DIAGNOSIS — N186 End stage renal disease: Secondary | ICD-10-CM

## 2021-06-05 DIAGNOSIS — Z992 Dependence on renal dialysis: Secondary | ICD-10-CM

## 2021-06-05 DIAGNOSIS — R509 Fever, unspecified: Secondary | ICD-10-CM | POA: Diagnosis not present

## 2021-06-05 DIAGNOSIS — R059 Cough, unspecified: Secondary | ICD-10-CM | POA: Diagnosis not present

## 2021-06-05 DIAGNOSIS — M329 Systemic lupus erythematosus, unspecified: Secondary | ICD-10-CM | POA: Diagnosis not present

## 2021-06-05 DIAGNOSIS — R21 Rash and other nonspecific skin eruption: Secondary | ICD-10-CM | POA: Diagnosis not present

## 2021-06-05 DIAGNOSIS — J90 Pleural effusion, not elsewhere classified: Secondary | ICD-10-CM | POA: Diagnosis not present

## 2021-06-05 DIAGNOSIS — I10 Essential (primary) hypertension: Secondary | ICD-10-CM | POA: Diagnosis not present

## 2021-06-05 DIAGNOSIS — R109 Unspecified abdominal pain: Secondary | ICD-10-CM | POA: Diagnosis not present

## 2021-06-05 DIAGNOSIS — B9561 Methicillin susceptible Staphylococcus aureus infection as the cause of diseases classified elsewhere: Secondary | ICD-10-CM | POA: Diagnosis not present

## 2021-06-05 DIAGNOSIS — A4101 Sepsis due to Methicillin susceptible Staphylococcus aureus: Secondary | ICD-10-CM | POA: Diagnosis not present

## 2021-06-05 NOTE — Progress Notes (Signed)
ASSESSMENT & PLAN   END-STAGE RENAL DISEASE: Based on her vein map she does not appear to be a candidate for a fistula. She will likely need placement of an AV graft.  I have explained however that I always look myself at the veins to be sure that there are any options for a fistula.  She is right-handed so we would start in the left arm. I have explained the indications for placement of an AV fistula or AV graft. I've explained that if at all possible we will place an AV fistula.  I have reviewed the risks of placement of an AV fistula including but not limited to: failure of the fistula to mature, need for subsequent interventions, and thrombosis. In addition I have reviewed the potential complications of placement of an AV graft. These risks include, but are not limited to, graft thrombosis, graft infection, wound healing problems, bleeding, arm swelling, and steal syndrome. All the patient's questions were answered and they are agreeable to proceed with surgery.  Her surgery is scheduled for 06/24/2021.  REASON FOR CONSULT:    To evaluate for hemodialysis access.  The consult is requested by Dr. Madelon Lips.  HPI:   Cathy [name] is a 40 y.o. female who was referred for hemodialysis access.  I have reviewed the records from the referring office.  If we were unable to place a fistula we were asked to proceed with placement of an AV graft.  She currently has a functioning tunneled dialysis catheter.  On my history this patient has end-stage renal disease secondary to lupus.  She has been on dialysis since January of this year.  She uses a right IJ tunneled dialysis catheter which has been working well.  She has had some nausea related to her uremia.  She has no other uremic symptoms.  Except for some generalized weakness.  She does not have a pacemaker.  She is not on any blood thinners.  Past Medical History:  Diagnosis Date   Anxiety    Asthma    Bipolar depression ([place])     Depression    ESRD (end stage renal disease) on dialysis ([place]) 10/2020   Gonorrhea    Lupus ([facility])    Pelvic inflammatory disease (PID)    Renal hypertension    Trichomonas infection     Family History  Problem Relation Age of Onset   Diabetes Maternal Grandmother    Hypertension Maternal Grandmother    Diabetes Maternal Grandfather    Hypertension Maternal Grandfather    Diabetes Paternal Grandmother    Hypertension Paternal Grandmother    Diabetes Paternal Grandfather    Hypertension Paternal Grandfather     SOCIAL HISTORY: Social History   Tobacco Use   Smoking status: Former    Packs/day: 0.25    Pack years: 0.00    Types: Cigarettes    Quit date: 10/08/2020    Years since quitting: 0.6   Smokeless tobacco: Never  Substance Use Topics   Alcohol use: Not Currently    No Known Allergies  Current Outpatient Medications  Medication Sig Dispense Refill   albuterol (VENTOLIN HFA) 108 (90 Base) MCG/ACT inhaler INHALE 1-2 PUFFS INTO THE LUNGS 2 (TWO) TIMES DAILY. 8.5 g 2   amLODipine (NORVASC) 10 MG tablet TAKE 1 TABLET (10 MG TOTAL) BY MOUTH DAILY. 30 tablet 2   cloNIDine (CATAPRES) 0.3 MG tablet TAKE 1 TABLET (0.3 MG TOTAL) BY MOUTH 3 (THREE) TIMES DAILY. 90 tablet 2   Cyanocobalamin (  B-12) 1000 MCG TABS TAKE 1 TABLET (100 MCG TOTAL) BY MOUTH DAILY. 30 tablet 1   cyanocobalamin 100 MCG tablet Take 1 tablet (100 mcg total) by mouth daily. 30 tablet 1   folic acid (FOLVITE) 1 MG tablet TAKE 1 TABLET (1 MG TOTAL) BY MOUTH DAILY. 30 tablet 1   guaiFENesin (MUCINEX) 600 MG 12 hr tablet TAKE 1 TABLET (600 MG TOTAL) BY MOUTH 2 (TWO) TIMES DAILY. 30 tablet 0   ipratropium (ATROVENT HFA) 17 MCG/ACT inhaler INHALE 2 PUFFS INTO THE LUNGS EVERY 6 (SIX) HOURS. 12.9 g 12   labetalol (NORMODYNE) 200 MG tablet TAKE 2 TABLETS (400 MG TOTAL) BY MOUTH 2 (TWO) TIMES DAILY. 120 tablet 3   olmesartan (BENICAR) 40 MG tablet Take 1 tablet (40 mg total) by mouth daily. 90 tablet 0   pantoprazole  (PROTONIX) 40 MG tablet TAKE 1 TABLET (40 MG TOTAL) BY MOUTH DAILY. 30 tablet 1   predniSONE (DELTASONE) 20 MG tablet TAKE 2 TABLETS (40 MG TOTAL) BY MOUTH DAILY WITH BREAKFAST. 60 tablet 3   torsemide (DEMADEX) 20 MG tablet TAKE 3 TABLETS (60 MG TOTAL) BY MOUTH 2 (TWO) TIMES DAILY. 180 tablet 2   Vitamin D3 (VITAMIN D) 25 MCG tablet TAKE 1 TABLET (1,000 UNITS TOTAL) BY MOUTH DAILY. 30 tablet 0   No current facility-administered medications for this visit.    REVIEW OF SYSTEMS:  '[X]'$  denotes positive finding, '[ ]'$  denotes negative finding Cardiac  Comments:  Chest pain or chest pressure:    Shortness of breath upon exertion:    Short of breath when lying flat:    Irregular heart rhythm:        Vascular    Pain in calf, thigh, or hip brought on by ambulation:    Pain in feet at night that wakes you up from your sleep:     Blood clot in your veins:    Leg swelling:         Pulmonary    Oxygen at home:    Productive cough:     Wheezing:         Neurologic    Sudden weakness in arms or legs:     Sudden numbness in arms or legs:     Sudden onset of difficulty speaking or slurred speech:    Temporary loss of vision in one eye:     Problems with dizziness:         Gastrointestinal    Blood in stool:     Vomited blood:         Genitourinary    Burning when urinating:     Blood in urine:        Psychiatric    Major depression:         Hematologic    Bleeding problems:    Problems with blood clotting too easily:        Skin    Rashes or ulcers:        Constitutional    Fever or chills:    -  PHYSICAL EXAM:   Vitals:   06/05/21 1300  BP: 110/76  Pulse: (!) 51  Resp: 20  Temp: (!) 97.3 F (36.3 C)  SpO2: 92%  Weight: 147 lb (66.7 kg)  Height: '5\' 7"'$  (1.702 m)   Body mass index is 23.02 kg/m. GENERAL: The patient is a well-nourished female, in no acute distress. The vital signs are documented above. CARDIAC: There is a regular rate and rhythm.  VASCULAR: She  has palpable brachial and radial pulses bilaterally. She has mild bilateral lower extremity swelling. PULMONARY: There is good air exchange bilaterally without wheezing or rales. ABDOMEN: Soft and non-tender with normal pitched bowel sounds.  MUSCULOSKELETAL: There are no major deformities. NEUROLOGIC: No focal weakness or paresthesias are detected. SKIN: There are no ulcers or rashes noted. PSYCHIATRIC: The patient has a normal affect.  DATA:    ARTERIAL DUPLEX: I have independently interpreted her arterial duplex scan today.  On the right side she has a biphasic radial and biphasic ulnar signal with the Doppler.  The brachial artery measures 3.7 mm in diameter.  On the left side she has a triphasic radial and triphasic ulnar signal.  The brachial artery measures 3.9 cm in diameter.  UPPER EXTREMITY VEIN MAP: I have independently interpreted her upper extremity vein map today.  On the right side, the cephalic vein is thrombosed in the forearm.  The upper arm cephalic vein is very small.  The basilic vein on the right is small.  Thus there do not appear to be any options for a fistula on the right.  On the left side the forearm and upper arm cephalic vein are very small.  The basilic vein is very small.  There do not appear to be any options for a fistula in the left arm.  Deitra Mayo [facility]

## 2021-06-06 ENCOUNTER — Emergency Department (HOSPITAL_COMMUNITY): Payer: Medicare Other

## 2021-06-06 ENCOUNTER — Emergency Department (HOSPITAL_COMMUNITY)
Admission: EM | Admit: 2021-06-06 | Discharge: 2021-06-06 | Disposition: A | Discharge status: Home / Self Care | Payer: Medicare Other | Source: Home / Self Care | Attending: Emergency Medicine | Admitting: Emergency Medicine

## 2021-06-06 ENCOUNTER — Encounter (HOSPITAL_COMMUNITY): Payer: Self-pay

## 2021-06-06 ENCOUNTER — Other Ambulatory Visit: Payer: Self-pay

## 2021-06-06 DIAGNOSIS — J45909 Unspecified asthma, uncomplicated: Secondary | ICD-10-CM | POA: Insufficient documentation

## 2021-06-06 DIAGNOSIS — R21 Rash and other nonspecific skin eruption: Secondary | ICD-10-CM | POA: Insufficient documentation

## 2021-06-06 DIAGNOSIS — J90 Pleural effusion, not elsewhere classified: Secondary | ICD-10-CM | POA: Diagnosis not present

## 2021-06-06 DIAGNOSIS — I132 Hypertensive heart and chronic kidney disease with heart failure and with stage 5 chronic kidney disease, or end stage renal disease: Secondary | ICD-10-CM | POA: Insufficient documentation

## 2021-06-06 DIAGNOSIS — R509 Fever, unspecified: Secondary | ICD-10-CM

## 2021-06-06 DIAGNOSIS — R9431 Abnormal electrocardiogram [ECG] [EKG]: Secondary | ICD-10-CM | POA: Diagnosis not present

## 2021-06-06 DIAGNOSIS — I509 Heart failure, unspecified: Secondary | ICD-10-CM | POA: Insufficient documentation

## 2021-06-06 DIAGNOSIS — Z87891 Personal history of nicotine dependence: Secondary | ICD-10-CM | POA: Insufficient documentation

## 2021-06-06 DIAGNOSIS — R1310 Dysphagia, unspecified: Secondary | ICD-10-CM | POA: Insufficient documentation

## 2021-06-06 DIAGNOSIS — Z8616 Personal history of COVID-19: Secondary | ICD-10-CM | POA: Insufficient documentation

## 2021-06-06 DIAGNOSIS — I959 Hypotension, unspecified: Secondary | ICD-10-CM | POA: Diagnosis not present

## 2021-06-06 DIAGNOSIS — R220 Localized swelling, mass and lump, head: Secondary | ICD-10-CM | POA: Insufficient documentation

## 2021-06-06 DIAGNOSIS — Z7689 Persons encountering health services in other specified circumstances: Secondary | ICD-10-CM | POA: Diagnosis not present

## 2021-06-06 DIAGNOSIS — R102 Pelvic and perineal pain: Secondary | ICD-10-CM | POA: Insufficient documentation

## 2021-06-06 DIAGNOSIS — R059 Cough, unspecified: Secondary | ICD-10-CM | POA: Diagnosis not present

## 2021-06-06 DIAGNOSIS — Z79899 Other long term (current) drug therapy: Secondary | ICD-10-CM | POA: Insufficient documentation

## 2021-06-06 DIAGNOSIS — Z7951 Long term (current) use of inhaled steroids: Secondary | ICD-10-CM | POA: Insufficient documentation

## 2021-06-06 DIAGNOSIS — N186 End stage renal disease: Secondary | ICD-10-CM | POA: Insufficient documentation

## 2021-06-06 DIAGNOSIS — Z992 Dependence on renal dialysis: Secondary | ICD-10-CM | POA: Insufficient documentation

## 2021-06-06 DIAGNOSIS — J029 Acute pharyngitis, unspecified: Secondary | ICD-10-CM | POA: Insufficient documentation

## 2021-06-06 LAB — CBC WITH DIFFERENTIAL/PLATELET
Abs Immature Granulocytes: 0 10*3/uL (ref 0.00–0.07)
Basophils Absolute: 0 10*3/uL (ref 0.0–0.1)
Basophils Relative: 0 %
Eosinophils Absolute: 0.1 10*3/uL (ref 0.0–0.5)
Eosinophils Relative: 1 %
HCT: 26.1 % — ABNORMAL LOW (ref 36.0–46.0)
Hemoglobin: 7.8 g/dL — ABNORMAL LOW (ref 12.0–15.0)
Lymphocytes Relative: 7 %
Lymphs Abs: 0.4 10*3/uL — ABNORMAL LOW (ref 0.7–4.0)
MCH: 25.3 pg — ABNORMAL LOW (ref 26.0–34.0)
MCHC: 29.9 g/dL — ABNORMAL LOW (ref 30.0–36.0)
MCV: 84.7 fL (ref 80.0–100.0)
Monocytes Absolute: 0.3 10*3/uL (ref 0.1–1.0)
Monocytes Relative: 6 %
Neutro Abs: 4.9 10*3/uL (ref 1.7–7.7)
Neutrophils Relative %: 86 %
Platelets: 200 10*3/uL (ref 150–400)
RBC: 3.08 MIL/uL — ABNORMAL LOW (ref 3.87–5.11)
RDW: 20.6 % — ABNORMAL HIGH (ref 11.5–15.5)
WBC: 5.7 10*3/uL (ref 4.0–10.5)
nRBC: 0 % (ref 0.0–0.2)
nRBC: 0 /100 WBC

## 2021-06-06 LAB — COMPREHENSIVE METABOLIC PANEL
ALT: 12 U/L (ref 0–44)
AST: 24 U/L (ref 15–41)
Albumin: 2.1 g/dL — ABNORMAL LOW (ref 3.5–5.0)
Alkaline Phosphatase: 140 U/L — ABNORMAL HIGH (ref 38–126)
Anion gap: 9 (ref 5–15)
BUN: 29 mg/dL — ABNORMAL HIGH (ref 6–20)
CO2: 27 mmol/L (ref 22–32)
Calcium: 7.5 mg/dL — ABNORMAL LOW (ref 8.9–10.3)
Chloride: 104 mmol/L (ref 98–111)
Creatinine, Ser: 9.88 mg/dL — ABNORMAL HIGH (ref 0.44–1.00)
GFR, Estimated: 5 mL/min — ABNORMAL LOW (ref >60)
Glucose, Bld: 98 mg/dL (ref 70–99)
Potassium: 3.4 mmol/L — ABNORMAL LOW (ref 3.5–5.1)
Sodium: 140 mmol/L (ref 135–145)
Total Bilirubin: 1.1 mg/dL (ref 0.3–1.2)
Total Protein: 5.3 g/dL — ABNORMAL LOW (ref 6.5–8.1)

## 2021-06-06 LAB — PROTIME-INR
INR: 1.1 (ref 0.8–1.2)
Prothrombin Time: 14.2 seconds (ref 11.4–15.2)

## 2021-06-06 LAB — I-STAT BETA HCG BLOOD, ED (MC, WL, AP ONLY): I-stat hCG, quantitative: <5 m[IU]/mL (ref <5)

## 2021-06-06 LAB — APTT: aPTT: 30 seconds (ref 24–36)

## 2021-06-06 LAB — LACTIC ACID, PLASMA: Lactic Acid, Venous: 0.8 mmol/L (ref 0.5–1.9)

## 2021-06-06 MED ORDER — PREDNISONE 10 MG PO TABS: ORAL_TABLET | ORAL | 0 refills | Status: AC

## 2021-06-06 MED ORDER — DOXYCYCLINE HYCLATE 100 MG PO CAPS: 100.0000 mg | ORAL_CAPSULE | Freq: Two times a day (BID) | ORAL | 0 refills | Status: DC

## 2021-06-06 MED ORDER — METHYLPREDNISOLONE SODIUM SUCC 125 MG IJ SOLR
125.0000 mg | Freq: Once | INTRAMUSCULAR | Status: AC
  Administered 2021-06-06: 125 mg via INTRAVENOUS
  Filled 2021-06-06: qty 2

## 2021-06-06 NOTE — Discharge Instructions (Addendum)
You were seen in the emergency department for a rash on your face and a fever.  We talked about the possibility that your rash is a bad lupus flareup.  We gave you some steroids and an IV and started you on a 16-day taper of steroids to take at home.  You will need to take the full taper as directed.  This will begin tomorrow morning.  I also put you on antibiotics for 10 days.  This is to prevent pneumonia or skin infection.  Your next dose is due tonight before bedtime.  Please take the full course as directed.  You will need to call to make an appointment with a rheumatologist.  You can call [facility], but you can also call [facility] or other surrounding areas.  It is important that you schedule an appointment for your lupus.  We talked about the possibility that your allergic reaction may be a condition called Stevens-Johnson syndrome.  This seems less likely at this time.  However, if you have any of the following symptoms, you should return to our emergency department or to a nearby burn center, such as [facility] in [place], if you can safely reach there.  Pay attention for peeling or sloughing of your skin (patches of skin sliding off), pain in your eyes with changes in vision, new ulcers or peeling inside your lips, tongue, or gums, or worsening rash covering your body.  If you experience any of these symptoms, you need to go back to the ER immediately.  This can be a serious medical condition.  *  Finally, for the purposes of dialysis, it is important that you follow-up on Saturday as scheduled.  It is safe to complete dialysis, and dangerous for you to miss dialysis.  You can bring these papers to document that.

## 2021-06-06 NOTE — ED Provider Notes (Signed)
[name] EMERGENCY DEPARTMENT Provider Note   CSN: [id number] Arrival date & time: 06/06/21  [id number]     History Chief Complaint  Patient presents with   Allergic Reaction    Cathy [name] is a 40 y.o. female with history of end-stage renal disease on dialysis Tuesday Thursday Saturday, history of lupus, presenting to emergency department with concern for rash on her face and extremities.  Patient reports that this rash began 4 days ago on Friday.  She noted some swelling of her lip and began having blistering around her face.  She also reports a sore throat and painful swallowing.  She feels like she is having swelling inside her nose.  She reports the rash has not spread involving her lower extremities or right > left.  The rash is painful and pruritic.  She has never had a reaction like this before.  She does have a history of lupus and has had flareups with a rash in the past, but these usually involve her bilateral arms, never her face.  She noted that she was told she had a temperature of 102.5 at dialysis today.  She denies subjective fevers at home.  She does not feel short of breath.  She reports she did miss dialysis also earlier this week.  Nephrologist is Dr Hollie Salk  She also is a history of PID and pelvic infection, but adamantly denies to me any new sexual activity or concerns for pelvic infection or pelvic pain or vaginal discharge.  She has not put on any new medications.  She does not take any sulfa medications.  She is not currently on steroids.  HPI     Past Medical History:  Diagnosis Date   Anxiety    Asthma    Bipolar depression ([place])    Depression    ESRD (end stage renal disease) on dialysis ([place]) 10/2020   Gonorrhea    Lupus ([place])    Pelvic inflammatory disease (PID)    Renal hypertension    Trichomonas infection     Patient Active Problem List   Diagnosis Date Noted   Encounter for antineoplastic chemotherapy 01/07/2021    Respiratory failure ([place]) 12/24/2020   Flash pulmonary edema ([place]) 12/23/2020   Hypertensive emergency 12/23/2020   CHF exacerbation ([place]) 12/23/2020   ESRD (end stage renal disease) ([place]) 12/23/2020   Elevated troponin 12/23/2020   COVID-19 virus infection 12/23/2020   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   Volume overload 11/29/2020   Hyperkalemia 11/29/2020   Pericardial effusion    Aortic atherosclerosis ([place]) 11/07/2020   Anasarca 11/07/2020   Abdominal pain 11/07/2020   Diarrhea 11/07/2020   Hydrosalpinx (bilateral) 02/07/2019   Pelvic adhesive disease 02/03/2019   Tubal ligation evaluation 02/03/2019   Renal hypertension 02/02/2019   Pulmonary edema 02/01/2019   AMA (advanced maternal age) multigravida 35+ 01/12/2019   Non compliance w medication regimen 01/12/2019   Current chronic use of systemic steroids 01/12/2019   Unwanted fertility 12/10/2018   Anemia 10/12/2018   Supervision of high risk pregnancy, antepartum 10/04/2018   Lupus ([place]) 09/20/2018   Supervision of pregnancy with grand multiparity in second trimester 09/20/2018   Substance abuse ([place]) 09/20/2018   Acute renal failure (ARF) ([place]) 09/20/2018   MDD (major depressive disorder) 10/11/2015   Asthma 09/22/2011    Past Surgical History:  Procedure Laterality Date   IR FLUORO GUIDE CV LINE RIGHT  11/30/2020   IR US GUIDE VASC ACCESS RIGHT  11/30/2020   RENAL BIOPSY  TUBAL LIGATION N/A 02/03/2019   Procedure: POST PARTUM TUBAL LIGATION;  Surgeon: Aletha Halim, MD;  Location: [facility] LD ORS;  Service: Gynecology;  Laterality: N/A;     OB History     Gravida  6   Para  5   Term  4   Preterm  1   AB  1   Living  5      SAB  1   IAB  0   Ectopic  0   Multiple  0   Live Births  5           Family History  Problem Relation Age of Onset   Diabetes Maternal Grandmother    Hypertension Maternal Grandmother    Diabetes Maternal Grandfather    Hypertension Maternal Grandfather     Diabetes Paternal Grandmother    Hypertension Paternal Grandmother    Diabetes Paternal Grandfather    Hypertension Paternal Grandfather     Social History   Tobacco Use   Smoking status: Former    Packs/day: 0.25    Pack years: 0.00    Types: Cigarettes    Quit date: 10/08/2020    Years since quitting: 0.6   Smokeless tobacco: Never  Vaping Use   Vaping Use: Never used  Substance Use Topics   Alcohol use: Not Currently   Drug use: Not Currently    Types: Marijuana    Comment: 3 times a week     Home Medications Prior to Admission medications   Medication Sig Start Date End Date Taking? Authorizing Provider  doxycycline (VIBRAMYCIN) 100 MG capsule Take 1 capsule (100 mg total) by mouth 2 (two) times daily for 10 days. 06/06/21 06/16/21 Yes Roda Lauture, Carola Rhine, MD  predniSONE (DELTASONE) 10 MG tablet Take 6 tablets (60 mg total) by mouth daily with breakfast for 4 days, THEN 4 tablets (40 mg total) daily with breakfast for 4 days, THEN 2 tablets (20 mg total) daily with breakfast for 4 days, THEN 1 tablet (10 mg total) daily with breakfast for 4 days. 06/07/21 06/23/21 Yes Yoshiharu Brassell, Carola Rhine, MD  albuterol (VENTOLIN HFA) 108 (90 Base) MCG/ACT inhaler INHALE 1-2 PUFFS INTO THE LUNGS 2 (TWO) TIMES DAILY. 12/31/20 12/31/21  Regalado, Belkys A, MD  amLODipine (NORVASC) 10 MG tablet TAKE 1 TABLET (10 MG TOTAL) BY MOUTH DAILY. 12/31/20 12/31/21  Regalado, Belkys A, MD  cloNIDine (CATAPRES) 0.3 MG tablet TAKE 1 TABLET (0.3 MG TOTAL) BY MOUTH 3 (THREE) TIMES DAILY. 12/31/20 12/31/21  Regalado, Belkys A, MD  Cyanocobalamin (B-12) 1000 MCG TABS TAKE 1 TABLET (100 MCG TOTAL) BY MOUTH DAILY. 12/31/20 12/31/21  Regalado, Jerald Kief A, MD  cyanocobalamin 100 MCG tablet Take 1 tablet (100 mcg total) by mouth daily. 12/31/20   Regalado, Belkys A, MD  folic acid (FOLVITE) 1 MG tablet TAKE 1 TABLET (1 MG TOTAL) BY MOUTH DAILY. 12/31/20 12/31/21  Regalado, Belkys A, MD  guaiFENesin (MUCINEX) 600 MG 12 hr tablet TAKE 1 TABLET  (600 MG TOTAL) BY MOUTH 2 (TWO) TIMES DAILY. 12/31/20 12/31/21  Regalado, Belkys A, MD  ipratropium (ATROVENT HFA) 17 MCG/ACT inhaler INHALE 2 PUFFS INTO THE LUNGS EVERY 6 (SIX) HOURS. 12/31/20 12/31/21  Regalado, Belkys A, MD  labetalol (NORMODYNE) 200 MG tablet TAKE 2 TABLETS (400 MG TOTAL) BY MOUTH 2 (TWO) TIMES DAILY. 12/31/20 12/31/21  Regalado, Belkys A, MD  olmesartan (BENICAR) 40 MG tablet Take 1 tablet (40 mg total) by mouth daily. 01/20/21   Mariel Aloe, MD  pantoprazole ([place])  40 MG tablet TAKE 1 TABLET (40 MG TOTAL) BY MOUTH DAILY. 12/31/20 12/31/21  Regalado, Belkys A, MD  predniSONE (DELTASONE) 20 MG tablet TAKE 2 TABLETS (40 MG TOTAL) BY MOUTH DAILY WITH BREAKFAST. 12/31/20 12/31/21  Regalado, Belkys A, MD  torsemide (DEMADEX) 20 MG tablet TAKE 3 TABLETS (60 MG TOTAL) BY MOUTH 2 (TWO) TIMES DAILY. 12/31/20 12/31/21  Regalado, Jerald Kief A, MD  Vitamin D3 (VITAMIN D) 25 MCG tablet TAKE 1 TABLET (1,000 UNITS TOTAL) BY MOUTH DAILY. 12/31/20 12/31/21  Regalado, Jerald Kief A, MD  fenofibrate 160 MG tablet Take 1 tablet (160 mg total) by mouth daily. 12/31/20 01/20/21  Regalado, Cassie Freer, MD    Allergies    Patient has no known allergies.  Review of Systems   Review of Systems  Constitutional:  Negative for chills and fever.  HENT:  Positive for sore throat and trouble swallowing.   Respiratory:  Negative for cough and shortness of breath.   Cardiovascular:  Negative for chest pain and palpitations.  Gastrointestinal:  Negative for abdominal pain and vomiting.  Musculoskeletal:  Negative for arthralgias and back pain.  Skin:  Positive for rash and wound.  Neurological:  Negative for syncope and headaches.  All other systems reviewed and are negative.  Physical Exam Updated Vital Signs BP (!) 119/91   Pulse 64   Temp 99.3 F (37.4 C) (Oral)   Resp 13   Ht '5\' 7"'$  (1.702 m)   Wt 66.7 kg   LMP 12/10/2020 (Approximate) Comment: no cycle since dx with kidney failure in Jan  SpO2 100%   BMI  23.03 kg/m   Physical Exam Constitutional:      General: She is not in acute distress. HENT:     Head: Normocephalic and atraumatic.  Eyes:     Conjunctiva/sclera: Conjunctivae normal.     Pupils: Pupils are equal, round, and reactive to light.  Cardiovascular:     Rate and Rhythm: Normal rate and regular rhythm.  Pulmonary:     Effort: Pulmonary effort is normal. No respiratory distress.  Abdominal:     General: There is no distension.     Tenderness: There is no abdominal tenderness.  Skin:    General: Skin is warm and dry.     Comments: Peeling rash noted on face (see photo) involving nares No visible lesions in oropharynx  Neurological:     General: No focal deficit present.     Mental Status: She is alert. Mental status is at baseline.  Psychiatric:        Mood and Affect: Mood normal.        Behavior: Behavior normal.        ED Results / Procedures / Treatments   Labs (all labs ordered are listed, but only abnormal results are displayed) Labs Reviewed  COMPREHENSIVE METABOLIC PANEL - Abnormal; Notable for the following components:      Result Value   Potassium 3.4 (*)    BUN 29 (*)    Creatinine, Ser 9.88 (*)    Calcium 7.5 (*)    Total Protein 5.3 (*)    Albumin 2.1 (*)    Alkaline Phosphatase 140 (*)    GFR, Estimated 5 (*)    All other components within normal limits  CBC WITH DIFFERENTIAL/PLATELET - Abnormal; Notable for the following components:   RBC 3.08 (*)    Hemoglobin 7.8 (*)    HCT 26.1 (*)    MCH 25.3 (*)    MCHC 29.9 (*)  RDW 20.6 (*)    Lymphs Abs 0.4 (*)    All other components within normal limits  CULTURE, BLOOD (SINGLE)  URINE CULTURE  CULTURE, BLOOD (ROUTINE X 2) W REFLEX TO ID PANEL  LACTIC ACID, PLASMA  PROTIME-INR  APTT  URINALYSIS, ROUTINE W REFLEX MICROSCOPIC  I-STAT BETA HCG BLOOD, ED ([facility], [facility], [facility] ONLY)    EKG EKG Interpretation  Date/Time:  Thursday June 06 2021 09:51:37 EDT Ventricular Rate:  62 PR  Interval:  145 QRS Duration: 71 QT Interval:  501 QTC Calculation: 509 R Axis:   85 Text Interpretation: Sinus or ectopic atrial rhythm Left ventricular hypertrophy NO significant changes from Jan 18 2021 ecg, no STEMI Borderline prolonged QTc Confirmed by Octaviano Glow [phone]) on 06/06/2021 10:53:01 AM  Radiology DG Chest Port 1 View  Result Date: 06/06/2021 CLINICAL DATA:  Questionable sepsis EXAM: PORTABLE CHEST 1 VIEW COMPARISON:  01/18/2021 FINDINGS: Chronic cardiopericardial enlargement. Hazy opacity at the bases which includes small volume pleural fluid. Dialysis catheter with tip at the right atrium. No pneumothorax. IMPRESSION: Small pleural effusions with right more than left pulmonary opacity at the bases. Atelectasis or pneumonia could be present at the right base. Electronically Signed   By: Monte Fantasia M.D.   On: 06/06/2021 10:07   VAS [place] UPPER EXTREMITY ARTERIAL DUPLEX  Result Date: 06/05/2021  UPPER EXTREMITY DUPLEX STUDY Patient Name:  ARMIE HOLCOMBE  Date of Exam:   06/05/2021 Medical Rec #: [id number]         Accession #:    [id number] Date of Birth: 10-15-1981        Patient Gender: F Patient Age:   41Y Exam Location:  [facility] Procedure:      VAS [place] UPPER EXTREMITY ARTERIAL DUPLEX Referring Phys: [id number] [place] --------------------------------------------------------------------------------  Indications: New access needed. History:     Patient has a history of ESRD.  Performing Technologist: Ronal Fear RVS, RCS  Examination Guidelines: A complete evaluation includes B-mode imaging, spectral Doppler, color Doppler, and power Doppler as needed of all accessible portions of each vessel. Bilateral testing is considered an integral part of a complete examination. Limited examinations for reoccurring indications may be performed as noted.  Right Pre-Dialysis Findings:  +-----------------------+----------+--------------------+---------+--------+ Location               PSV (cm/s)Intralum. Diam. (cm)Waveform Comments +-----------------------+----------+--------------------+---------+--------+ Brachial Antecub. fossa59        0.37                triphasic         +-----------------------+----------+--------------------+---------+--------+ Radial Art at Wrist    24        0.18                biphasic          +-----------------------+----------+--------------------+---------+--------+ Ulnar Art at Wrist     29        0.16                biphasic          +-----------------------+----------+--------------------+---------+--------+  Left Pre-Dialysis Findings: +-----------------------+----------+--------------------+---------+--------+ Location               PSV (cm/s)Intralum. Diam. (cm)Waveform Comments +-----------------------+----------+--------------------+---------+--------+ Brachial Antecub. fossa58        0.39                triphasic         +-----------------------+----------+--------------------+---------+--------+ Radial Art at Wrist    65  0.18                triphasic         +-----------------------+----------+--------------------+---------+--------+ Ulnar Art at Wrist     29        0.15                triphasic         +-----------------------+----------+--------------------+---------+--------+  Summary:  Right: No obstruction visualized in the right upper extremity. Left: No obstruction visualized in the left upper extremity. *See table(s) above for measurements and observations. Electronically signed by Deitra Mayo MD on 06/05/2021 at 1:18:38 PM.    Final    VAS [place] UPPER EXTREMITY VEIN MAPPING  Result Date: 06/05/2021 UPPER EXTREMITY VEIN MAPPING Patient Name:  RIHONNA RAJ  Date of Exam:   06/05/2021 Medical Rec #: [id number]         Accession #:    [id number] Date of Birth: Jan 17, 1981        Patient  Gender: F Patient Age:   39Y Exam Location:  [facility] Procedure:      VAS [place] UPPER EXT VEIN MAPPING (PRE-OP AVF) Referring Phys: [id number] [place] --------------------------------------------------------------------------------  Indications: Pre-access. History: ESRD.  Performing Technologist: Ronal Fear RVS, RCS  Examination Guidelines: A complete evaluation includes B-mode imaging, spectral Doppler, color Doppler, and power Doppler as needed of all accessible portions of each vessel. Bilateral testing is considered an integral part of a complete examination. Limited examinations for reoccurring indications may be performed as noted. +-----------------+-------------+----------+--------+ Right Cephalic   Diameter (cm)Depth (cm)Findings +-----------------+-------------+----------+--------+ Shoulder             0.25                        +-----------------+-------------+----------+--------+ Prox upper arm       0.21                        +-----------------+-------------+----------+--------+ Mid upper arm        0.19                        +-----------------+-------------+----------+--------+ Dist upper arm       0.18                        +-----------------+-------------+----------+--------+ Antecubital fossa    0.22                        +-----------------+-------------+----------+--------+ Prox forearm                            thrombus +-----------------+-------------+----------+--------+ Mid forearm                             thrombus +-----------------+-------------+----------+--------+ Dist forearm                            thrombus +-----------------+-------------+----------+--------+ +-----------------+-------------+----------+--------+ Right Basilic    Diameter (cm)Depth (cm)Findings +-----------------+-------------+----------+--------+ Mid upper arm        0.16                         +-----------------+-------------+----------+--------+ Dist upper arm       0.18                        +-----------------+-------------+----------+--------+  Antecubital fossa    0.17                        +-----------------+-------------+----------+--------+ +-----------------+-------------+----------+--------+ Left Cephalic    Diameter (cm)Depth (cm)Findings +-----------------+-------------+----------+--------+ Shoulder             0.18                        +-----------------+-------------+----------+--------+ Prox upper arm       0.16                        +-----------------+-------------+----------+--------+ Mid upper arm        0.21                        +-----------------+-------------+----------+--------+ Dist upper arm       0.19                        +-----------------+-------------+----------+--------+ Antecubital fossa    0.28                        +-----------------+-------------+----------+--------+ Prox forearm         0.16                        +-----------------+-------------+----------+--------+ Mid forearm          0.11                        +-----------------+-------------+----------+--------+ +-----------------+-------------+----------+--------+ Left Basilic     Diameter (cm)Depth (cm)Findings +-----------------+-------------+----------+--------+ Mid upper arm        0.18                        +-----------------+-------------+----------+--------+ Dist upper arm       0.16                        +-----------------+-------------+----------+--------+ Antecubital fossa    0.15                        +-----------------+-------------+----------+--------+ Summary: Right: Patent and compressible basilic vein.        The cephalic vein is thrombosed in the forearm. Left: Patent and compressible cephalic and basilic veins. *See table(s) above for measurements and observations.  Diagnosing physician: Deitra Mayo MD  Electronically signed by Deitra Mayo MD on 06/05/2021 at 1:54:47 PM.    Final     Procedures Procedures   Medications Ordered in ED Medications  methylPREDNISolone sodium succinate (SOLU-MEDROL) 125 mg/2 mL injection 125 mg (125 mg Intravenous Given 06/06/21 1419)    ED Course  I have reviewed the triage vital signs and the nursing notes.  Pertinent labs & imaging results that were available during my care of the patient were reviewed by me and considered in my medical decision making (see chart for details).  Ddx includes lupus flare up vs allergic reaction vs other  She has no clear inciting medications or insult to suggest Stevens-Johnson syndrome.  There is no ocular involvement or oral lesions I can see, no lesions or sloughing of the soft tissue of the axilla, groin, or digital web space.    I suspect this is most likely a lupus flare up, as these facial rashes can present similarly.  I will attempt to discuss management with an on-call provider from [facility] as we don't have a rheumatologist available in our system.  However she remains clinically well appearing and has minimal symptoms, despite this rash, and is repeatedly requesting for discharge home.  If there is a reasonable option for home management, I think we can attempt this.  Otherwise she is not running a fever today.  Her x-ray shows bilateral small pleural effusions cannot exclude pneumonia.  However white blood cell count is completely normal and her lactate is normal.  I felt a course of doxycycline would be reasonable to cover for a possible PNA which may be not well visualized on xray, and also to provide prophylactic coverage against cellulitis/facial infection with her wounds.  Labs reviewed - these look stable here, K is normal.  Her next dialysis is Saturday, which is reasonable follow up.  I don't think she needs emergent dialysis at this time per workup and clinical exam.  Clinical Course as of 06/06/21 1642   Thu Jun 06, 2021  1159 Reviewed her labs.  Per her labs and clinical exam there is no emergent indication for dialysis at this time.  However remain concerned about the rash on her face.  I will reach out to [facility]  [MT]  1328 I spoke to [place] rheumatologist Dr Genella Mech, regarding general management for suspected lupus flareup and rash.  They advised steroids for several days, PO is okay, and need for outpatient rheumatology.  I reevaluated the patient.  Given that she is clinically well-appearing, I think be reasonable to start her on a steroid taper after giving her an IV dose in the ED, and also prescribe her doxycycline to cover empirically for pneumonia.  Her vital signs are otherwise normal, she does not appear to be septic, and she would prefer to go home as she had small children.  I carefully reviewed with her return precautions including signs and symptoms of Stevens-Johnson's.  At this time given that there is minimal mucosal involvement, and at lesion and rash is largely on the face, and there is no sloughing of the skin, and no inciting agent, I have a lower suspicion for SJS/TENS. [MT]    Clinical Course User Index [MT] Jr Milliron, Carola Rhine, MD    Final Clinical Impression(s) / ED Diagnoses Final diagnoses:  Cough  Rash  Fever, unspecified fever cause    Rx / DC Orders ED Discharge Orders          Ordered    predniSONE (DELTASONE) 10 MG tablet        06/06/21 1413    doxycycline (VIBRAMYCIN) 100 MG capsule  2 times daily        06/06/21 1413             Mileena Rothenberger, Carola Rhine, MD 06/06/21 1643

## 2021-06-06 NOTE — ED Triage Notes (Addendum)
Pt BIB [facility] from dialysis, staff called for an allergic reaction, pt had welps/rash on her face, looks more like a burn. Dialysis would not treat her. Pt has a hx of Lupus and feels this is a lupus flare up. Questionable dialysis catheter infection  Dialysis M/W/F  SBP 80 HR 70 98% RA  CBG 94 RR 17 Temp 102.5 (per dialysis)   20g LFA received 300cc NS BP 126/80

## 2021-06-07 ENCOUNTER — Telehealth (HOSPITAL_BASED_OUTPATIENT_CLINIC_OR_DEPARTMENT_OTHER): Payer: Self-pay | Admitting: *Deleted

## 2021-06-07 LAB — BLOOD CULTURE ID PANEL (REFLEXED) - BCID2

## 2021-06-07 NOTE — Telephone Encounter (Signed)
Called ms Gandolfi on cell phone, mail box is full,  called home number  no answer  left message for pt to call me back

## 2021-06-08 ENCOUNTER — Inpatient Hospital Stay (HOSPITAL_COMMUNITY)
Admission: EM | Admit: 2021-06-08 | Discharge: 2021-06-14 | DRG: 545 | Disposition: A | Discharge status: Home / Self Care | Payer: Medicare Other | Attending: Family Medicine | Admitting: Family Medicine

## 2021-06-08 ENCOUNTER — Telehealth: Payer: Self-pay | Admitting: Internal Medicine

## 2021-06-08 ENCOUNTER — Encounter (HOSPITAL_COMMUNITY): Payer: Self-pay | Admitting: Emergency Medicine

## 2021-06-08 ENCOUNTER — Other Ambulatory Visit: Payer: Self-pay

## 2021-06-08 ENCOUNTER — Emergency Department (HOSPITAL_COMMUNITY): Payer: Medicare Other

## 2021-06-08 DIAGNOSIS — D65 Disseminated intravascular coagulation [defibrination syndrome]: Secondary | ICD-10-CM | POA: Diagnosis present

## 2021-06-08 DIAGNOSIS — R197 Diarrhea, unspecified: Secondary | ICD-10-CM | POA: Diagnosis present

## 2021-06-08 DIAGNOSIS — N2581 Secondary hyperparathyroidism of renal origin: Secondary | ICD-10-CM | POA: Diagnosis present

## 2021-06-08 DIAGNOSIS — R0600 Dyspnea, unspecified: Secondary | ICD-10-CM

## 2021-06-08 DIAGNOSIS — N049 Nephrotic syndrome with unspecified morphologic changes: Secondary | ICD-10-CM | POA: Diagnosis not present

## 2021-06-08 DIAGNOSIS — N186 End stage renal disease: Secondary | ICD-10-CM | POA: Diagnosis not present

## 2021-06-08 DIAGNOSIS — M3212 Pericarditis in systemic lupus erythematosus: Secondary | ICD-10-CM | POA: Diagnosis present

## 2021-06-08 DIAGNOSIS — R7881 Bacteremia: Secondary | ICD-10-CM | POA: Diagnosis not present

## 2021-06-08 DIAGNOSIS — J45909 Unspecified asthma, uncomplicated: Secondary | ICD-10-CM | POA: Diagnosis present

## 2021-06-08 DIAGNOSIS — B9561 Methicillin susceptible Staphylococcus aureus infection as the cause of diseases classified elsewhere: Secondary | ICD-10-CM | POA: Diagnosis present

## 2021-06-08 DIAGNOSIS — A4901 Methicillin susceptible Staphylococcus aureus infection, unspecified site: Secondary | ICD-10-CM

## 2021-06-08 DIAGNOSIS — I340 Nonrheumatic mitral (valve) insufficiency: Secondary | ICD-10-CM | POA: Diagnosis not present

## 2021-06-08 DIAGNOSIS — I071 Rheumatic tricuspid insufficiency: Secondary | ICD-10-CM | POA: Diagnosis present

## 2021-06-08 DIAGNOSIS — L309 Dermatitis, unspecified: Secondary | ICD-10-CM | POA: Diagnosis present

## 2021-06-08 DIAGNOSIS — Z992 Dependence on renal dialysis: Secondary | ICD-10-CM | POA: Diagnosis not present

## 2021-06-08 DIAGNOSIS — I313 Pericardial effusion (noninflammatory): Secondary | ICD-10-CM | POA: Diagnosis present

## 2021-06-08 DIAGNOSIS — Z8616 Personal history of COVID-19: Secondary | ICD-10-CM | POA: Diagnosis not present

## 2021-06-08 DIAGNOSIS — F3130 Bipolar disorder, current episode depressed, mild or moderate severity, unspecified: Secondary | ICD-10-CM | POA: Diagnosis present

## 2021-06-08 DIAGNOSIS — D631 Anemia in chronic kidney disease: Secondary | ICD-10-CM | POA: Diagnosis present

## 2021-06-08 DIAGNOSIS — Z8249 Family history of ischemic heart disease and other diseases of the circulatory system: Secondary | ICD-10-CM

## 2021-06-08 DIAGNOSIS — A4101 Sepsis due to Methicillin susceptible Staphylococcus aureus: Secondary | ICD-10-CM

## 2021-06-08 DIAGNOSIS — I361 Nonrheumatic tricuspid (valve) insufficiency: Secondary | ICD-10-CM | POA: Diagnosis not present

## 2021-06-08 DIAGNOSIS — I5043 Acute on chronic combined systolic (congestive) and diastolic (congestive) heart failure: Secondary | ICD-10-CM | POA: Diagnosis not present

## 2021-06-08 DIAGNOSIS — Z833 Family history of diabetes mellitus: Secondary | ICD-10-CM

## 2021-06-08 DIAGNOSIS — Z4901 Encounter for fitting and adjustment of extracorporeal dialysis catheter: Secondary | ICD-10-CM | POA: Diagnosis not present

## 2021-06-08 DIAGNOSIS — T829XXA Unspecified complication of cardiac and vascular prosthetic device, implant and graft, initial encounter: Secondary | ICD-10-CM | POA: Diagnosis not present

## 2021-06-08 DIAGNOSIS — T80211A Bloodstream infection due to central venous catheter, initial encounter: Secondary | ICD-10-CM | POA: Diagnosis not present

## 2021-06-08 DIAGNOSIS — I132 Hypertensive heart and chronic kidney disease with heart failure and with stage 5 chronic kidney disease, or end stage renal disease: Secondary | ICD-10-CM | POA: Diagnosis not present

## 2021-06-08 DIAGNOSIS — K219 Gastro-esophageal reflux disease without esophagitis: Secondary | ICD-10-CM | POA: Diagnosis present

## 2021-06-08 DIAGNOSIS — E785 Hyperlipidemia, unspecified: Secondary | ICD-10-CM | POA: Diagnosis present

## 2021-06-08 DIAGNOSIS — F419 Anxiety disorder, unspecified: Secondary | ICD-10-CM | POA: Diagnosis present

## 2021-06-08 DIAGNOSIS — T380X5A Adverse effect of glucocorticoids and synthetic analogues, initial encounter: Secondary | ICD-10-CM | POA: Diagnosis present

## 2021-06-08 DIAGNOSIS — J90 Pleural effusion, not elsewhere classified: Secondary | ICD-10-CM | POA: Diagnosis not present

## 2021-06-08 DIAGNOSIS — I5020 Unspecified systolic (congestive) heart failure: Secondary | ICD-10-CM

## 2021-06-08 DIAGNOSIS — Z87891 Personal history of nicotine dependence: Secondary | ICD-10-CM | POA: Diagnosis not present

## 2021-06-08 DIAGNOSIS — Z79899 Other long term (current) drug therapy: Secondary | ICD-10-CM

## 2021-06-08 DIAGNOSIS — Z87441 Personal history of nephrotic syndrome: Secondary | ICD-10-CM | POA: Diagnosis not present

## 2021-06-08 DIAGNOSIS — I10 Essential (primary) hypertension: Secondary | ICD-10-CM | POA: Diagnosis not present

## 2021-06-08 DIAGNOSIS — Z20822 Contact with and (suspected) exposure to covid-19: Secondary | ICD-10-CM | POA: Diagnosis not present

## 2021-06-08 DIAGNOSIS — R109 Unspecified abdominal pain: Secondary | ICD-10-CM | POA: Diagnosis not present

## 2021-06-08 DIAGNOSIS — R0781 Pleurodynia: Secondary | ICD-10-CM

## 2021-06-08 DIAGNOSIS — I5041 Acute combined systolic (congestive) and diastolic (congestive) heart failure: Secondary | ICD-10-CM | POA: Diagnosis not present

## 2021-06-08 DIAGNOSIS — Z9115 Patient's noncompliance with renal dialysis: Secondary | ICD-10-CM

## 2021-06-08 DIAGNOSIS — F329 Major depressive disorder, single episode, unspecified: Secondary | ICD-10-CM | POA: Diagnosis present

## 2021-06-08 DIAGNOSIS — M329 Systemic lupus erythematosus, unspecified: Principal | ICD-10-CM | POA: Diagnosis present

## 2021-06-08 DIAGNOSIS — Z7952 Long term (current) use of systemic steroids: Secondary | ICD-10-CM

## 2021-06-08 DIAGNOSIS — I083 Combined rheumatic disorders of mitral, aortic and tricuspid valves: Secondary | ICD-10-CM | POA: Diagnosis not present

## 2021-06-08 DIAGNOSIS — R079 Chest pain, unspecified: Secondary | ICD-10-CM | POA: Diagnosis not present

## 2021-06-08 HISTORY — DX: Pleural effusion, not elsewhere classified: J90

## 2021-06-08 HISTORY — DX: Nephrotic syndrome with unspecified morphologic changes: N04.9

## 2021-06-08 LAB — CBC WITH DIFFERENTIAL/PLATELET
Abs Immature Granulocytes: 0.06 10*3/uL (ref 0.00–0.07)
Basophils Absolute: 0 10*3/uL (ref 0.0–0.1)
Basophils Relative: 0 %
Eosinophils Absolute: 0.2 10*3/uL (ref 0.0–0.5)
Eosinophils Relative: 2 %
HCT: 27.5 % — ABNORMAL LOW (ref 36.0–46.0)
Hemoglobin: 8.1 g/dL — ABNORMAL LOW (ref 12.0–15.0)
Immature Granulocytes: 1 %
Lymphocytes Relative: 21 %
Lymphs Abs: 1.4 10*3/uL (ref 0.7–4.0)
MCH: 24.8 pg — ABNORMAL LOW (ref 26.0–34.0)
MCHC: 29.5 g/dL — ABNORMAL LOW (ref 30.0–36.0)
MCV: 84.4 fL (ref 80.0–100.0)
Monocytes Absolute: 0.5 10*3/uL (ref 0.1–1.0)
Monocytes Relative: 7 %
Neutro Abs: 4.6 10*3/uL (ref 1.7–7.7)
Neutrophils Relative %: 69 %
Platelets: 233 10*3/uL (ref 150–400)
RBC: 3.26 MIL/uL — ABNORMAL LOW (ref 3.87–5.11)
RDW: 21 % — ABNORMAL HIGH (ref 11.5–15.5)
WBC: 6.7 10*3/uL (ref 4.0–10.5)
nRBC: 0 % (ref 0.0–0.2)

## 2021-06-08 LAB — COMPREHENSIVE METABOLIC PANEL
ALT: 16 U/L (ref 0–44)
AST: 31 U/L (ref 15–41)
Albumin: 2.3 g/dL — ABNORMAL LOW (ref 3.5–5.0)
Alkaline Phosphatase: 156 U/L — ABNORMAL HIGH (ref 38–126)
Anion gap: 12 (ref 5–15)
BUN: 38 mg/dL — ABNORMAL HIGH (ref 6–20)
CO2: 25 mmol/L (ref 22–32)
Calcium: 7.4 mg/dL — ABNORMAL LOW (ref 8.9–10.3)
Chloride: 102 mmol/L (ref 98–111)
Creatinine, Ser: 9.9 mg/dL — ABNORMAL HIGH (ref 0.44–1.00)
GFR, Estimated: 5 mL/min — ABNORMAL LOW (ref >60)
Glucose, Bld: 87 mg/dL (ref 70–99)
Potassium: 3.4 mmol/L — ABNORMAL LOW (ref 3.5–5.1)
Sodium: 139 mmol/L (ref 135–145)
Total Bilirubin: 0.6 mg/dL (ref 0.3–1.2)
Total Protein: 5.8 g/dL — ABNORMAL LOW (ref 6.5–8.1)

## 2021-06-08 LAB — CBC
HCT: 29.5 % — ABNORMAL LOW (ref 36.0–46.0)
Hemoglobin: 8.7 g/dL — ABNORMAL LOW (ref 12.0–15.0)
MCH: 24.6 pg — ABNORMAL LOW (ref 26.0–34.0)
MCHC: 29.5 g/dL — ABNORMAL LOW (ref 30.0–36.0)
MCV: 83.6 fL (ref 80.0–100.0)
Platelets: 248 10*3/uL (ref 150–400)
RBC: 3.53 MIL/uL — ABNORMAL LOW (ref 3.87–5.11)
RDW: 21 % — ABNORMAL HIGH (ref 11.5–15.5)
WBC: 6.6 10*3/uL (ref 4.0–10.5)
nRBC: 0 % (ref 0.0–0.2)

## 2021-06-08 LAB — LACTIC ACID, PLASMA
Lactic Acid, Venous: 0.8 mmol/L (ref 0.5–1.9)
Lactic Acid, Venous: 1 mmol/L (ref 0.5–1.9)

## 2021-06-08 LAB — CREATININE, SERUM
Creatinine, Ser: 9.38 mg/dL — ABNORMAL HIGH (ref 0.44–1.00)
GFR, Estimated: 5 mL/min — ABNORMAL LOW (ref >60)

## 2021-06-08 LAB — ECHOCARDIOGRAM COMPLETE
AR max vel: 2.42 cm2
AV Area VTI: 2.08 cm2
AV Area mean vel: 2.07 cm2
AV Mean grad: 5 mmHg
AV Peak grad: 8.3 mmHg
Ao pk vel: 1.44 m/s
Area-P 1/2: 4.8 cm2
S' Lateral: 4.7 cm

## 2021-06-08 LAB — RESP PANEL BY RT-PCR (FLU A&B, COVID) ARPGX2
Influenza A by PCR: NEGATIVE
Influenza B by PCR: NEGATIVE
SARS Coronavirus 2 by RT PCR: NEGATIVE

## 2021-06-08 LAB — APTT: aPTT: 28 seconds (ref 24–36)

## 2021-06-08 LAB — SEDIMENTATION RATE: Sed Rate: 62 mm/hr — ABNORMAL HIGH (ref 0–22)

## 2021-06-08 LAB — C-REACTIVE PROTEIN: CRP: 1.2 mg/dL — ABNORMAL HIGH (ref <1.0)

## 2021-06-08 LAB — I-STAT BETA HCG BLOOD, ED (MC, WL, AP ONLY): I-stat hCG, quantitative: <5 m[IU]/mL (ref <5)

## 2021-06-08 LAB — PROTIME-INR
INR: 1 (ref 0.8–1.2)
Prothrombin Time: 12.6 seconds (ref 11.4–15.2)

## 2021-06-08 MED ORDER — LABETALOL HCL 200 MG PO TABS
400.0000 mg | ORAL_TABLET | Freq: Two times a day (BID) | ORAL | Status: DC
  Administered 2021-06-08 – 2021-06-09 (×3): 400 mg via ORAL
  Filled 2021-06-08 (×6): qty 2

## 2021-06-08 MED ORDER — CEFAZOLIN SODIUM-DEXTROSE 2-4 GM/100ML-% IV SOLN
2.0000 g | Freq: Once | INTRAVENOUS | Status: AC
  Administered 2021-06-08: 2 g via INTRAVENOUS
  Filled 2021-06-08: qty 100

## 2021-06-08 MED ORDER — WHITE PETROLATUM EX OINT
TOPICAL_OINTMENT | CUTANEOUS | Status: AC
  Filled 2021-06-08: qty 28.35

## 2021-06-08 MED ORDER — HYDRALAZINE HCL 25 MG PO TABS
25.0000 mg | ORAL_TABLET | Freq: Four times a day (QID) | ORAL | Status: DC | PRN
  Administered 2021-06-09 – 2021-06-13 (×8): 25 mg via ORAL
  Filled 2021-06-08 (×8): qty 1

## 2021-06-08 MED ORDER — ALBUTEROL SULFATE (2.5 MG/3ML) 0.083% IN NEBU
2.5000 mg | INHALATION_SOLUTION | RESPIRATORY_TRACT | Status: DC | PRN
  Administered 2021-06-11 – 2021-06-12 (×2): 2.5 mg via RESPIRATORY_TRACT
  Filled 2021-06-08 (×2): qty 3

## 2021-06-08 MED ORDER — ACETAMINOPHEN 325 MG PO TABS
650.0000 mg | ORAL_TABLET | Freq: Four times a day (QID) | ORAL | Status: DC | PRN
  Administered 2021-06-09 – 2021-06-14 (×6): 650 mg via ORAL
  Filled 2021-06-08 (×5): qty 2

## 2021-06-08 MED ORDER — METHYLPREDNISOLONE SODIUM SUCC 125 MG IJ SOLR
125.0000 mg | Freq: Once | INTRAMUSCULAR | Status: AC
  Administered 2021-06-08: 125 mg via INTRAVENOUS
  Filled 2021-06-08: qty 2

## 2021-06-08 MED ORDER — PREDNISONE 20 MG PO TABS
40.0000 mg | ORAL_TABLET | Freq: Every day | ORAL | Status: DC
  Administered 2021-06-09 – 2021-06-14 (×5): 40 mg via ORAL
  Filled 2021-06-08 (×6): qty 2

## 2021-06-08 MED ORDER — ACETAMINOPHEN 650 MG RE SUPP: 650.0000 mg | Freq: Four times a day (QID) | RECTAL | Status: DC | PRN

## 2021-06-08 MED ORDER — TORSEMIDE 20 MG PO TABS
60.0000 mg | ORAL_TABLET | Freq: Two times a day (BID) | ORAL | Status: DC
  Administered 2021-06-08 – 2021-06-14 (×11): 60 mg via ORAL
  Filled 2021-06-08 (×14): qty 3

## 2021-06-08 MED ORDER — CEFAZOLIN SODIUM-DEXTROSE 1-4 GM/50ML-% IV SOLN
1.0000 g | INTRAVENOUS | Status: DC
  Administered 2021-06-09 – 2021-06-12 (×4): 1 g via INTRAVENOUS
  Filled 2021-06-08 (×4): qty 50

## 2021-06-08 MED ORDER — ONDANSETRON HCL 4 MG/2ML IJ SOLN: 4.0000 mg | Freq: Four times a day (QID) | INTRAMUSCULAR | Status: DC | PRN

## 2021-06-08 MED ORDER — PANTOPRAZOLE SODIUM 40 MG PO TBEC
40.0000 mg | DELAYED_RELEASE_TABLET | Freq: Every day | ORAL | Status: DC
  Administered 2021-06-08 – 2021-06-14 (×7): 40 mg via ORAL
  Filled 2021-06-08 (×8): qty 1

## 2021-06-08 MED ORDER — ONDANSETRON HCL 4 MG PO TABS: 4.0000 mg | ORAL_TABLET | Freq: Four times a day (QID) | ORAL | Status: DC | PRN

## 2021-06-08 MED ORDER — HEPARIN SODIUM (PORCINE) 5000 UNIT/ML IJ SOLN
5000.0000 [IU] | Freq: Three times a day (TID) | INTRAMUSCULAR | Status: DC
  Administered 2021-06-08 – 2021-06-14 (×14): 5000 [IU] via SUBCUTANEOUS
  Filled 2021-06-08 (×15): qty 1

## 2021-06-08 MED ORDER — OXYCODONE HCL 5 MG PO TABS
5.0000 mg | ORAL_TABLET | ORAL | Status: DC | PRN
  Administered 2021-06-08 – 2021-06-14 (×8): 5 mg via ORAL
  Filled 2021-06-08 (×9): qty 1

## 2021-06-08 MED ORDER — POLYETHYLENE GLYCOL 3350 17 G PO PACK
17.0000 g | PACK | Freq: Every day | ORAL | Status: DC | PRN
  Administered 2021-06-10: 17 g via ORAL
  Filled 2021-06-08: qty 1

## 2021-06-08 MED ORDER — CLONIDINE HCL 0.2 MG PO TABS
0.3000 mg | ORAL_TABLET | Freq: Three times a day (TID) | ORAL | Status: DC
  Administered 2021-06-08 – 2021-06-12 (×10): 0.3 mg via ORAL
  Filled 2021-06-08 (×11): qty 1

## 2021-06-08 NOTE — Telephone Encounter (Signed)
I spoke to the patient over the phone to let her know that she has a bloodstream infection (MSSA) and needs to come back to the hospital for treatment. She is anticipated to be admitted for work up of bacteremia and rule out of endocarditis.  Feel free to call id on call for further recommendations.  Elzie Rings [facility] for Infectious Diseases [phone]

## 2021-06-08 NOTE — Progress Notes (Signed)
Elink following sepsis 

## 2021-06-08 NOTE — ED Triage Notes (Addendum)
Pt to triage via [facility] from home.  Reports lupus flare-up x 1 week.  C/o burning pain to bilateral legs and face.  Seen by her doctor and received call today that she had bacteria in her blood and to come to ED.  Per ID "bloodstream infection (MSSA) and needs to come back to the hospital for treatment. She is anticipated to be admitted for work up of bacteremia and rule out of endocarditis"

## 2021-06-08 NOTE — ED Provider Notes (Signed)
[facility] EMERGENCY DEPARTMENT Provider Note   CSN: [id number] Arrival date & time: 06/08/21  1411     History No chief complaint on file.   Cathy [name] is a 40 y.o. female.  40 y.o female with a PMH Bipolar, Anxiety, Renal HTN, ESRD on dialysis (T.H.S) presents to the ED with a chief complaint of pain all over x 1 week. Patient states there is pain to her face, legs along with lesions to her face and lesions. Originally evaluated in the ED two days ago and had blood cultures dranw which were positive for MSSA infection. She was called by Dr. Evelina Bucy who advised her to be evaluated in the ED and admitted. Patient has not taken any medications for her pains, she also missed dialysis today. She denies any fever, chest pain, shortness of breath or other complaints.     The history is provided by the patient.      Past Medical History:  Diagnosis Date   Anxiety    Asthma    Bipolar depression ([place])    Depression    ESRD (end stage renal disease) on dialysis ([place]) 10/2020   Gonorrhea    Lupus ([place])    Pelvic inflammatory disease (PID)    Renal hypertension    Trichomonas infection     Patient Active Problem List   Diagnosis Date Noted   Encounter for antineoplastic chemotherapy 01/07/2021   Respiratory failure ([place]) 12/24/2020   Flash pulmonary edema ([place]) 12/23/2020   Hypertensive emergency 12/23/2020   CHF exacerbation ([place]) 12/23/2020   ESRD (end stage renal disease) ([place]) 12/23/2020   Elevated troponin 12/23/2020   COVID-19 virus infection 12/23/2020   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) 11/07/2020   Anasarca 11/07/2020   Abdominal pain 11/07/2020   Diarrhea 11/07/2020   Hydrosalpinx (bilateral) 02/07/2019   Pelvic adhesive disease 02/03/2019   Tubal ligation evaluation 02/03/2019   Renal hypertension 02/02/2019   Pulmonary edema  02/01/2019   AMA (advanced maternal age) multigravida 35+ 01/12/2019   Non compliance w medication regimen 01/12/2019   Current chronic use of systemic steroids 01/12/2019   Unwanted fertility 12/10/2018   Anemia 10/12/2018   Supervision of high risk pregnancy, antepartum 10/04/2018   Lupus ([place]) 09/20/2018   Supervision of pregnancy with grand multiparity in second trimester 09/20/2018   Substance abuse ([place]) 09/20/2018   Acute renal failure (ARF) ([place]) 09/20/2018   MDD (major depressive disorder) 10/11/2015   Asthma 09/22/2011    Past Surgical History:  Procedure Laterality Date   IR FLUORO GUIDE CV LINE RIGHT  11/30/2020   IR US GUIDE VASC ACCESS RIGHT  11/30/2020   RENAL BIOPSY     TUBAL LIGATION N/A 02/03/2019   Procedure: POST PARTUM TUBAL LIGATION;  Surgeon: Aletha Halim, MD;  Location: [facility] LD ORS;  Service: Gynecology;  Laterality: N/A;     OB History     Gravida  6   Para  5   Term  4   Preterm  1   AB  1   Living  5      SAB  1   IAB  0   Ectopic  0   Multiple  0   Live Births  5           Family History  Problem Relation Age of Onset   Diabetes Maternal Grandmother    Hypertension  Maternal Grandmother    Diabetes Maternal Grandfather    Hypertension Maternal Grandfather    Diabetes Paternal Grandmother    Hypertension Paternal Grandmother    Diabetes Paternal Grandfather    Hypertension Paternal Grandfather     Social History   Tobacco Use   Smoking status: Former    Packs/day: 0.25    Pack years: 0.00    Types: Cigarettes    Quit date: 10/08/2020    Years since quitting: 0.6   Smokeless tobacco: Never  Vaping Use   Vaping Use: Never used  Substance Use Topics   Alcohol use: Not Currently   Drug use: Not Currently    Types: Marijuana    Comment: 3 times a week     Home Medications Prior to Admission medications   Medication Sig Start Date End Date Taking? Authorizing Provider  albuterol (VENTOLIN HFA) 108 (90 Base)  MCG/ACT inhaler INHALE 1-2 PUFFS INTO THE LUNGS 2 (TWO) TIMES DAILY. 12/31/20 12/31/21  Regalado, Belkys A, MD  amLODipine (NORVASC) 10 MG tablet TAKE 1 TABLET (10 MG TOTAL) BY MOUTH DAILY. 12/31/20 12/31/21  Regalado, Belkys A, MD  cloNIDine (CATAPRES) 0.3 MG tablet TAKE 1 TABLET (0.3 MG TOTAL) BY MOUTH 3 (THREE) TIMES DAILY. 12/31/20 12/31/21  Regalado, Belkys A, MD  Cyanocobalamin (B-12) 1000 MCG TABS TAKE 1 TABLET (100 MCG TOTAL) BY MOUTH DAILY. 12/31/20 12/31/21  Regalado, Jerald Kief A, MD  cyanocobalamin 100 MCG tablet Take 1 tablet (100 mcg total) by mouth daily. 12/31/20   Regalado, Belkys A, MD  doxycycline (VIBRAMYCIN) 100 MG capsule Take 1 capsule (100 mg total) by mouth 2 (two) times daily for 10 days. 06/06/21 06/16/21  Wyvonnia Dusky, MD  folic acid (FOLVITE) 1 MG tablet TAKE 1 TABLET (1 MG TOTAL) BY MOUTH DAILY. 12/31/20 12/31/21  Regalado, Belkys A, MD  guaiFENesin (MUCINEX) 600 MG 12 hr tablet TAKE 1 TABLET (600 MG TOTAL) BY MOUTH 2 (TWO) TIMES DAILY. 12/31/20 12/31/21  Regalado, Belkys A, MD  ipratropium (ATROVENT HFA) 17 MCG/ACT inhaler INHALE 2 PUFFS INTO THE LUNGS EVERY 6 (SIX) HOURS. 12/31/20 12/31/21  Regalado, Belkys A, MD  labetalol (NORMODYNE) 200 MG tablet TAKE 2 TABLETS (400 MG TOTAL) BY MOUTH 2 (TWO) TIMES DAILY. 12/31/20 12/31/21  Regalado, Belkys A, MD  olmesartan (BENICAR) 40 MG tablet Take 1 tablet (40 mg total) by mouth daily. 01/20/21   Mariel Aloe, MD  pantoprazole (PROTONIX) 40 MG tablet TAKE 1 TABLET (40 MG TOTAL) BY MOUTH DAILY. 12/31/20 12/31/21  Regalado, Belkys A, MD  predniSONE (DELTASONE) 10 MG tablet Take 6 tablets (60 mg total) by mouth daily with breakfast for 4 days, THEN 4 tablets (40 mg total) daily with breakfast for 4 days, THEN 2 tablets (20 mg total) daily with breakfast for 4 days, THEN 1 tablet (10 mg total) daily with breakfast for 4 days. 06/07/21 06/23/21  Wyvonnia Dusky, MD  predniSONE (DELTASONE) 20 MG tablet TAKE 2 TABLETS (40 MG TOTAL) BY MOUTH DAILY WITH  BREAKFAST. 12/31/20 12/31/21  Regalado, Belkys A, MD  torsemide (DEMADEX) 20 MG tablet TAKE 3 TABLETS (60 MG TOTAL) BY MOUTH 2 (TWO) TIMES DAILY. 12/31/20 12/31/21  Regalado, Jerald Kief A, MD  Vitamin D3 (VITAMIN D) 25 MCG tablet TAKE 1 TABLET (1,000 UNITS TOTAL) BY MOUTH DAILY. 12/31/20 12/31/21  Regalado, Jerald Kief A, MD  fenofibrate 160 MG tablet Take 1 tablet (160 mg total) by mouth daily. 12/31/20 01/20/21  Elmarie Shiley, MD    Allergies    Patient  has no known allergies.  Review of Systems   Review of Systems  Constitutional:  Negative for chills and fever.  HENT:  Negative for sore throat.   Respiratory:  Negative for shortness of breath.   Cardiovascular:  Negative for chest pain.  Gastrointestinal:  Positive for abdominal pain and diarrhea. Negative for blood in stool, nausea and vomiting.  Genitourinary:  Negative for difficulty urinating and flank pain.  Musculoskeletal:  Negative for back pain.  Skin:  Negative for pallor and wound.  Neurological:  Negative for facial asymmetry.  All other systems reviewed and are negative.  Physical Exam Updated Vital Signs BP (!) 159/115   Pulse 73   Temp 97.9 F (36.6 C)   Resp 18   SpO2 96%   Physical Exam Vitals and nursing note reviewed.  Constitutional:      Appearance: Normal appearance. She is ill-appearing.     Comments: Chronically ill appearing.   HENT:     Head: Normocephalic and atraumatic.     Comments: Multiple vesicular lesions to bilateral periorbital areas.     Nose: Nose normal.     Mouth/Throat:     Mouth: Mucous membranes are moist.  Eyes:     Pupils: Pupils are equal, round, and reactive to light.  Cardiovascular:     Rate and Rhythm: Normal rate.     Comments: 1+ bilaterally pitting edema.  Pulmonary:     Effort: Pulmonary effort is normal.     Breath sounds: No wheezing or rales.  Abdominal:     General: Abdomen is flat. There is distension.     Tenderness: There is no right CVA tenderness or left CVA  tenderness.  Musculoskeletal:     Cervical back: Normal range of motion and neck supple.     Right lower leg: 1+ Pitting Edema present.     Left lower leg: 1+ Pitting Edema present.  Skin:    General: Skin is warm and dry.  Neurological:     Mental Status: She is alert and oriented to person, place, and time.    ED Results / Procedures / Treatments   Labs (all labs ordered are listed, but only abnormal results are displayed) Labs Reviewed  COMPREHENSIVE METABOLIC PANEL - Abnormal; Notable for the following components:      Result Value   Potassium 3.4 (*)    BUN 38 (*)    Creatinine, Ser 9.90 (*)    Calcium 7.4 (*)    Total Protein 5.8 (*)    Albumin 2.3 (*)    Alkaline Phosphatase 156 (*)    GFR, Estimated 5 (*)    All other components within normal limits  CBC WITH DIFFERENTIAL/PLATELET - Abnormal; Notable for the following components:   RBC 3.26 (*)    Hemoglobin 8.1 (*)    HCT 27.5 (*)    MCH 24.8 (*)    MCHC 29.5 (*)    RDW 21.0 (*)    All other components within normal limits  CULTURE, BLOOD (ROUTINE X 2)  CULTURE, BLOOD (ROUTINE X 2)  LACTIC ACID, PLASMA  LACTIC ACID, PLASMA  URINALYSIS, ROUTINE W REFLEX MICROSCOPIC  I-STAT BETA HCG BLOOD, ED ([facility], [facility], [facility] ONLY)    EKG EKG Interpretation  Date/Time:  Saturday June 08 2021 14:38:28 EDT Ventricular Rate:  68 PR Interval:    QRS Duration: 74 QT Interval:  386 QTC Calculation: 410 R Axis:   26 Text Interpretation: Accelerated Junctional rhythm Low voltage QRS Cannot rule out Anterior  infarct , age undetermined Abnormal ECG Confirmed by Dene Gentry [phone]) on 06/08/2021 3:34:10 PM  Radiology No results found.  Procedures Procedures   Medications Ordered in ED Medications - No data to display  ED Course  I have reviewed the triage vital signs and the nursing notes.  Pertinent labs & imaging results that were available during my care of the patient were reviewed by me and considered in my medical  decision making (see chart for details).    MDM Rules/Calculators/A&P     Patient with a past medical history of ESRD on dialysis Tuesday, Thursday, Saturday missed her dialysis today and a prior history of lupus presents to the emergency department via EMS for concerns of endocarditis.  Evaluated here 2 days ago with a rash to her face and extremities, had blood cultures drawn 2 days ago these were positive for MSSA.  Followed by nephrology Dr. Quitman Livings.  Extensive chart review, I do see photograph from 06/06/2021, lesions have improved in nature, she is continue to report pain to her arms and legs, and throughout her body.  She has not taken any medication for improvement, she does apply Vaseline to periorbital area.  Lateral lower extremities do have some 1+ pitting edema bilaterally, but no calf tenderness.  They are very tender to palpation along the shin region.  He is afebrile on today's visit, denies any chills, denies any antipyretics prior to arrival.  The rest of her exam is benign.  Note from Dr. Lisette Grinder on her chart, reports that blood cultures are positive for MSSA, patient will need admission into the hospital to work-up for bacteremia and rule out endocarditis.  Call placed to Dr. Graylon Good for any further recommendations.  Blood culture from 06/06/2021, culture of Staph aureus, the second 1 has not had any results yet.  On today's evaluation, labs with a CMP with a slight decrease potassium.  Creatinine level is 9.9, this is within her prior range, she has not been dialyzed today.  LFTs are within normal limits.  White blood cell count on her CBC is normal.  Hemoglobin is within her normal limits.  Lactic acid is negative.  Beta hCG is also negative.  4:11 PM Spoke to Dr. Carlyle Basques from ID, who recommended Ancef daily, in addition echocardiogram and repetition of her blood cultures.  Patient is hemodynamically stable, and will be admitted to hospital service.  In addition, we will  place a call for nephrologist patient did miss her dialysis session today.  4:33 PMSpoke to hospitalist Dr. [place] who will admit patient for further management.  Call still out for nephrology, will inform them of patient in hospital.  Portions of this note were generated with Dragon dictation software. Dictation errors may occur despite best attempts at proofreading.  Final Clinical Impression(s) / ED Diagnoses Final diagnoses:  MSSA (methicillin susceptible Staphylococcus aureus) infection    Rx / DC Orders ED Discharge Orders     None        Janeece Fitting, Hershal Coria 06/08/21 1633    Valarie Merino, MD 06/09/21 2055

## 2021-06-08 NOTE — Progress Notes (Signed)
  Echocardiogram 2D Echocardiogram has been performed.  Merrie Roof F 06/08/2021, 5:17 PM

## 2021-06-08 NOTE — Consult Note (Signed)
Cardiology Consultation:   Patient ID: Cathy [name] MRN: [id number]; DOB: 1981-09-28  Admit date: 06/08/2021 Date of Consult: 06/08/2021  PCP:  Merryl Hacker No   [facility] Providers Cardiologist:  Kirk Ruths, MD        Patient Profile:   Cathy [name] is a 40 y.o. female with a hx of ESRD on HD TTS, lupus, anxiety, asthma, essential hypertension, GERDwho is being seen 06/08/2021 for the evaluation of chest pain and new onset HFrEF finding at the request of Dr [place]  History of Present Illness:   Cathy [name] is a 40 y.o. female with medical history significant of ESRD on HD TTS, lupus, anxiety, asthma, essential hypertension, GERD who presented to [facility] ED on 06/08/2021 for positive blood culture.  Patient was recently seen in the ED on 7/7 for a rash to her face and extremities and diagnosed with a lupus flare after having a fever at dialysis.  Blood cultures were drawn during that visit and patient was discharged home on doxycycline and a prednisone taper.  Patient was informed by infectious disease that she has a bloodstream infection and recommended to come back to the hospital for treatment.  She reports pain all over her body, especially the face and legs. She is fatigued and this is typical for her SLE flare up. She also notes sharp chest pain since last 2 weeks, worse with movement, deep breathing and gets better when she sits forward. Also reports Orthopnea and has missed HD this week. Last fever was on 06/06/21 when she came to the ER and got blood c/s drawn.  No prior h/o MI, CAD, CHF, strokes, blood clots. She also denies any drug use.   Past Medical History:  Diagnosis Date   Anxiety    Asthma    Bipolar depression ([place])    Depression    ESRD (end stage renal disease) on dialysis ([place]) 10/2020   Gonorrhea    Lupus ([place])    Pelvic inflammatory disease (PID)    Renal hypertension    Trichomonas infection     Past Surgical History:  Procedure Laterality Date    IR FLUORO GUIDE CV LINE RIGHT  11/30/2020   IR US GUIDE VASC ACCESS RIGHT  11/30/2020   RENAL BIOPSY     TUBAL LIGATION N/A 02/03/2019   Procedure: POST PARTUM TUBAL LIGATION;  Surgeon: Aletha Halim, MD;  Location: [facility] LD ORS;  Service: Gynecology;  Laterality: N/A;     Home Medications:  Prior to Admission medications   Medication Sig Start Date End Date Taking? Authorizing Provider  acetaminophen (TYLENOL) 500 MG tablet Take 1,000 mg by mouth every 6 (six) hours as needed for moderate pain.   Yes [provider]  albuterol (VENTOLIN HFA) 108 (90 Base) MCG/ACT inhaler INHALE 1-2 PUFFS INTO THE LUNGS 2 (TWO) TIMES DAILY. Patient taking differently: Inhale 2 puffs into the lungs 2 (two) times daily. 12/31/20 12/31/21 Yes Regalado, Belkys A, MD  amLODipine (NORVASC) 10 MG tablet TAKE 1 TABLET (10 MG TOTAL) BY MOUTH DAILY. Patient taking differently: Take 10 mg by mouth daily. 12/31/20 12/31/21 Yes Regalado, Belkys A, MD  cloNIDine (CATAPRES) 0.3 MG tablet TAKE 1 TABLET (0.3 MG TOTAL) BY MOUTH 3 (THREE) TIMES DAILY. Patient taking differently: Take 0.3 mg by mouth 3 (three) times daily. 12/31/20 12/31/21 Yes Regalado, Belkys A, MD  ipratropium (ATROVENT HFA) 17 MCG/ACT inhaler INHALE 2 PUFFS INTO THE LUNGS EVERY 6 (SIX) HOURS. Patient taking differently: Inhale 2 puffs into the  lungs every 6 (six) hours as needed for wheezing. 12/31/20 12/31/21 Yes Regalado, Belkys A, MD  labetalol (NORMODYNE) 200 MG tablet TAKE 2 TABLETS (400 MG TOTAL) BY MOUTH 2 (TWO) TIMES DAILY. Patient taking differently: Take 400 mg by mouth 2 (two) times daily. 12/31/20 12/31/21 Yes Regalado, Belkys A, MD  pantoprazole (PROTONIX) 40 MG tablet TAKE 1 TABLET (40 MG TOTAL) BY MOUTH DAILY. Patient taking differently: Take 40 mg by mouth daily. 12/31/20 12/31/21 Yes Regalado, Belkys A, MD  torsemide (DEMADEX) 20 MG tablet TAKE 3 TABLETS (60 MG TOTAL) BY MOUTH 2 (TWO) TIMES DAILY. Patient taking differently: Take 60 mg by  mouth 2 (two) times daily. 12/31/20 12/31/21 Yes Regalado, Belkys A, MD  Cyanocobalamin (B-12) 1000 MCG TABS TAKE 1 TABLET (100 MCG TOTAL) BY MOUTH DAILY. Patient not taking: No sig reported 12/31/20 12/31/21  Regalado, Jerald Kief A, MD  cyanocobalamin 100 MCG tablet Take 1 tablet (100 mcg total) by mouth daily. Patient not taking: No sig reported 12/31/20   Regalado, Belkys A, MD  doxycycline (VIBRAMYCIN) 100 MG capsule Take 1 capsule (100 mg total) by mouth 2 (two) times daily for 10 days. 06/06/21 06/16/21  Wyvonnia Dusky, MD  folic acid (FOLVITE) 1 MG tablet TAKE 1 TABLET (1 MG TOTAL) BY MOUTH DAILY. Patient not taking: No sig reported 12/31/20 12/31/21  Regalado, Belkys A, MD  guaiFENesin (MUCINEX) 600 MG 12 hr tablet TAKE 1 TABLET (600 MG TOTAL) BY MOUTH 2 (TWO) TIMES DAILY. Patient not taking: No sig reported 12/31/20 12/31/21  Regalado, Jerald Kief A, MD  olmesartan (BENICAR) 40 MG tablet Take 1 tablet (40 mg total) by mouth daily. Patient not taking: No sig reported 01/20/21   Mariel Aloe, MD  predniSONE (DELTASONE) 10 MG tablet Take 6 tablets (60 mg total) by mouth daily with breakfast for 4 days, THEN 4 tablets (40 mg total) daily with breakfast for 4 days, THEN 2 tablets (20 mg total) daily with breakfast for 4 days, THEN 1 tablet (10 mg total) daily with breakfast for 4 days. 06/07/21 06/23/21  Wyvonnia Dusky, MD  predniSONE (DELTASONE) 20 MG tablet TAKE 2 TABLETS (40 MG TOTAL) BY MOUTH DAILY WITH BREAKFAST. Patient not taking: No sig reported 12/31/20 12/31/21  Regalado, Jerald Kief A, MD  Vitamin D3 (VITAMIN D) 25 MCG tablet TAKE 1 TABLET (1,000 UNITS TOTAL) BY MOUTH DAILY. Patient not taking: No sig reported 12/31/20 12/31/21  Regalado, Jerald Kief A, MD  fenofibrate 160 MG tablet Take 1 tablet (160 mg total) by mouth daily. 12/31/20 01/20/21  Elmarie Shiley, MD    Inpatient Medications: Scheduled Meds:  Continuous Infusions:  [START ON 06/09/2021]  ceFAZolin (ANCEF) IV     PRN Meds:   Allergies:    No Known Allergies  Social History:   Social History   Socioeconomic History   Marital status: Single    Spouse name: Not on file   Number of children: Not on file   Years of education: Not on file   Highest education level: Not on file  Occupational History   Not on file  Tobacco Use   Smoking status: Former    Packs/day: 0.25    Pack years: 0.00    Types: Cigarettes    Quit date: 10/08/2020    Years since quitting: 0.6   Smokeless tobacco: Never  Vaping Use   Vaping Use: Never used  Substance and Sexual Activity   Alcohol use: Not Currently   Drug use: Not Currently    Types:  Marijuana    Comment: 3 times a week    Sexual activity: Not Currently    Birth control/protection: None  Other Topics Concern   Not on file  Social History Narrative   Not on file   Social Determinants of Health   Financial Resource Strain: Not on file  Food Insecurity: Not on file  Transportation Needs: Not on file  Physical Activity: Not on file  Stress: Not on file  Social Connections: Not on file  Intimate Partner Violence: Not on file    Family History:    Family History  Problem Relation Age of Onset   Diabetes Maternal Grandmother    Hypertension Maternal Grandmother    Diabetes Maternal Grandfather    Hypertension Maternal Grandfather    Diabetes Paternal Grandmother    Hypertension Paternal Grandmother    Diabetes Paternal Grandfather    Hypertension Paternal Grandfather      ROS:  Please see the history of present illness.   All other ROS reviewed and negative.     Physical Exam/Data:   Vitals:   06/08/21 1419 06/08/21 1715 06/08/21 1830  BP: (!) 159/115  (!) 174/119  Pulse: 73 90 73  Resp: 18 18 (!) 23  Temp: 97.9 F (36.6 C)  98.2 F (36.8 C)  TempSrc:   Oral  SpO2: 96% 100% 100%    Intake/Output Summary (Last 24 hours) at 06/08/2021 1834 Last data filed at 06/08/2021 1745 Gross per 24 hour  Intake 97.99 ml  Output --  Net 97.99 ml   Last 3 Weights  06/06/2021 06/05/2021 01/20/2021  Weight (lbs) 147 lb 0.8 oz 147 lb 177 lb 7.5 oz  Weight (kg) 66.7 kg 66.679 kg 80.5 kg     There is no height or weight on file to calculate BMI.  General:  Well nourished, well developed, appears chronically sick HEENT: normal, facial malar rash Lymph: no adenopathy Neck: JVD midneck Endocrine:  No thryomegaly Vascular: No carotid bruits; FA pulses 2+ bilaterally without bruits  Cardiac:  normal S1, S2; RRR; no murmur  Lungs:  decreased breath sounds at bases Abd: soft, nontender, no hepatomegaly  Ext: 1+edema, skin lesions and rashes Musculoskeletal:  No deformities, BUE and BLE strength normal and equal Skin: warm and dry  Neuro:  CNs 2-12 intact, no focal abnormalities noted Psych:  Normal affect    Laboratory Data:  High Sensitivity Troponin:  No results for input(s): TROPONINIHS in the last 720 hours.   Chemistry Recent Labs  Lab 06/06/21 1035 06/08/21 1430  NA 140 139  K 3.4* 3.4*  CL 104 102  CO2 27 25  GLUCOSE 98 87  BUN 29* 38*  CREATININE 9.88* 9.90*  CALCIUM 7.5* 7.4*  GFRNONAA 5* 5*  ANIONGAP 9 12    Recent Labs  Lab 06/06/21 1035 06/08/21 1430  PROT 5.3* 5.8*  ALBUMIN 2.1* 2.3*  AST 24 31  ALT 12 16  ALKPHOS 140* 156*  BILITOT 1.1 0.6   Hematology Recent Labs  Lab 06/06/21 1035 06/08/21 1430  WBC 5.7 6.7  RBC 3.08* 3.26*  HGB 7.8* 8.1*  HCT 26.1* 27.5*  MCV 84.7 84.4  MCH 25.3* 24.8*  MCHC 29.9* 29.5*  RDW 20.6* 21.0*  PLT 200 233   BNPNo results for input(s): BNP, PROBNP in the last 168 hours.  DDimer No results for input(s): DDIMER in the last 168 hours.   Radiology/Studies:  DG Chest Port 1 View  Result Date: 06/06/2021 CLINICAL DATA:  Questionable sepsis EXAM: PORTABLE  CHEST 1 VIEW COMPARISON:  01/18/2021 FINDINGS: Chronic cardiopericardial enlargement. Hazy opacity at the bases which includes small volume pleural fluid. Dialysis catheter with tip at the right atrium. No pneumothorax. IMPRESSION:  Small pleural effusions with right more than left pulmonary opacity at the bases. Atelectasis or pneumonia could be present at the right base. Electronically Signed   By: Monte Fantasia M.D.   On: 06/06/2021 10:07   VAS [place] UPPER EXTREMITY ARTERIAL DUPLEX  Result Date: 06/05/2021  UPPER EXTREMITY DUPLEX STUDY Patient Name:  Cathy [name]  Date of Exam:   06/05/2021 Medical Rec #: [id number]         Accession #:    [id number] Date of Birth: [birth year]        Patient Gender: F Patient Age:   43Y Exam Location:  [facility] Procedure:      VAS [place] UPPER EXTREMITY ARTERIAL DUPLEX Referring Phys: [id number] Waynetta [place] --------------------------------------------------------------------------------  Indications: New access needed. History:     Patient has a history of ESRD.  Performing Technologist: Ronal Fear RVS, RCS  Examination Guidelines: A complete evaluation includes B-mode imaging, spectral Doppler, color Doppler, and power Doppler as needed of all accessible portions of each vessel. Bilateral testing is considered an integral part of a complete examination. Limited examinations for reoccurring indications may be performed as noted.  Right Pre-Dialysis Findings: +-----------------------+----------+--------------------+---------+--------+ Location               PSV (cm/s)Intralum. Diam. (cm)Waveform Comments +-----------------------+----------+--------------------+---------+--------+ Brachial Antecub. fossa59        0.37                triphasic         +-----------------------+----------+--------------------+---------+--------+ Radial Art at Wrist    24        0.18                biphasic          +-----------------------+----------+--------------------+---------+--------+ Ulnar Art at Wrist     29        0.16                biphasic          +-----------------------+----------+--------------------+---------+--------+  Left Pre-Dialysis Findings:  +-----------------------+----------+--------------------+---------+--------+ Location               PSV (cm/s)Intralum. Diam. (cm)Waveform Comments +-----------------------+----------+--------------------+---------+--------+ Brachial Antecub. fossa58        0.39                triphasic         +-----------------------+----------+--------------------+---------+--------+ Radial Art at Wrist    65        0.18                triphasic         +-----------------------+----------+--------------------+---------+--------+ Ulnar Art at Wrist     29        0.15                triphasic         +-----------------------+----------+--------------------+---------+--------+  Summary:  Right: No obstruction visualized in the right upper extremity. Left: No obstruction visualized in the left upper extremity. *See table(s) above for measurements and observations. Electronically signed by Deitra Mayo MD on 06/05/2021 at 1:18:38 PM.    Final    ECHOCARDIOGRAM COMPLETE  Result Date: 06/08/2021    ECHOCARDIOGRAM REPORT   Patient Name:   Cathy [name] Date of Exam: 06/08/2021 Medical Rec #:  [id number]  Height:       67.0 in Accession #:    [id number]       Weight:       147.0 lb Date of Birth:  [birth year]       BSA:          1.774 m Patient Age:    39 years         BP:           155/119 mmHg Patient Gender: F                HR:           70 bpm. Exam Location:  Inpatient Procedure: 2D Echo, Cardiac Doppler and Color Doppler Indications:     Endocarditis  History:         Patient has prior history of Echocardiogram examinations, most                  recent 11/15/2020. Risk Factors:Former Smoker. Substance abuse.                  Anasarca. Lupus. Moderate pericardial effusion.  Sonographer:     Merrie Roof RDCS Referring Phys:  [id number] Janeece Fitting Diagnosing Phys: Rudean Haskell MD IMPRESSIONS  1. Left ventricular ejection fraction, by estimation, is 25 to 30%. The left ventricle has severely  decreased function. The left ventricle demonstrates global hypokinesis. There is mild concentric left ventricular hypertrophy. Left ventricular diastolic  parameters are consistent with Grade II diastolic dysfunction (pseudonormalization).  2. Right ventricular systolic function is normal. The right ventricular size is normal. Mildly increased right ventricular wall thickness. There is normal pulmonary artery systolic pressure.  3. Left atrial size was severely dilated.  4. Right atrial size was severely dilated.  5. Moderate pericardial effusion. There is no evidence of cardiac tamponade.  6. The mitral valve is grossly normal. Mild to moderate mitral valve regurgitation.  7. The tricuspid valve is abnormal- small 0.44 echodensity seen on the ventricular side of the tricuspid valve (Image 19/85. Tricuspid valve regurgitation is mild to moderate.  8. The aortic valve is tricuspid. Aortic valve regurgitation is mild. No aortic stenosis is present. Comparison(s): A prior study was performed on 11/15/2020. Prior images reviewed side by side. LVEF is significantly worse. Small echo density on tricuspid valve was only seen in PSAX axis (not well visualized in prior study). Conclusion(s)/Recommendation(s): TEE could be considered given the clinical question of endocarditis. Addendum: Primary team updated. FINDINGS  Left Ventricle: Left ventricular ejection fraction, by estimation, is 25 to 30%. The left ventricle has severely decreased function. The left ventricle demonstrates global hypokinesis. The left ventricular internal cavity size was normal in size. There is mild concentric left ventricular hypertrophy. Left ventricular diastolic parameters are consistent with Grade II diastolic dysfunction (pseudonormalization). Right Ventricle: The right ventricular size is normal. Mildly increased right ventricular wall thickness. Right ventricular systolic function is normal. There is normal pulmonary artery systolic pressure.  The tricuspid regurgitant velocity is 2.71 m/s, and with an assumed right atrial pressure of 3 mmHg, the estimated right ventricular systolic pressure is [id number] mmHg. Left Atrium: Left atrial size was severely dilated. Right Atrium: Right atrial size was severely dilated. Pericardium: A moderately sized pericardial effusion is present. There is no evidence of cardiac tamponade. Mitral Valve: The mitral valve is grossly normal. Mild to moderate mitral valve regurgitation. Tricuspid Valve: Small 0.44 echodensity seen on the ventricular side of the tricuspid valve (Image 19/85). The tricuspid valve  is abnormal. Tricuspid valve regurgitation is mild to moderate. No evidence of tricuspid stenosis. Aortic Valve: The aortic valve is tricuspid. Aortic valve regurgitation is mild. No aortic stenosis is present. Aortic valve mean gradient measures 5.0 mmHg. Aortic valve peak gradient measures 8.3 mmHg. Aortic valve area, by VTI measures 2.08 cm. Pulmonic Valve: The pulmonic valve was normal in structure. Pulmonic valve regurgitation is mild. No evidence of pulmonic stenosis. Aorta: The aortic root and ascending aorta are structurally normal, with no evidence of dilitation. IAS/Shunts: The atrial septum is grossly normal.  LEFT VENTRICLE PLAX 2D LVIDd:         5.40 cm  Diastology LVIDs:         4.70 cm  LV e' medial:    6.64 cm/s LV PW:         1.20 cm  LV E/e' medial:  17.2 LV IVS:        1.10 cm  LV e' lateral:   7.51 cm/s LVOT diam:     1.90 cm  LV E/e' lateral: 15.2 LV SV:         62 LV SV Index:   35 LVOT Area:     2.84 cm  RIGHT VENTRICLE             IVC RV Basal diam:  5.30 cm     IVC diam: 2.00 cm RV Mid diam:    3.40 cm RV S prime:     15.10 cm/s TAPSE (M-mode): 2.8 cm LEFT ATRIUM            Index       RIGHT ATRIUM           Index LA diam:      4.60 cm  2.59 cm/m  RA Area:     28.60 cm LA Vol (A2C): 97.2 ml  54.78 ml/m RA Volume:   111.00 ml 62.55 ml/m LA Vol (A4C): 116.0 ml 65.37 ml/m  AORTIC VALVE AV Area  (Vmax):    2.42 cm AV Area (Vmean):   2.07 cm AV Area (VTI):     2.08 cm AV Vmax:           144.00 cm/s AV Vmean:          99.200 cm/s AV VTI:            0.299 m AV Peak Grad:      8.3 mmHg AV Mean Grad:      5.0 mmHg LVOT Vmax:         123.00 cm/s LVOT Vmean:        72.300 cm/s LVOT VTI:          0.219 m LVOT/AV VTI ratio: 0.73  AORTA Ao Root diam: 3.30 cm Ao Asc diam:  3.40 cm MITRAL VALVE                TRICUSPID VALVE MV Area (PHT): 4.80 cm     TR Peak grad:   29.4 mmHg MV Decel Time: 158 msec     TR Vmax:        271.00 cm/s MV E velocity: 114.00 cm/s MV A velocity: 69.20 cm/s   SHUNTS MV E/A ratio:  1.65         Systemic VTI:  0.22 m                             Systemic Diam: 1.90 cm Rudean Haskell MD Electronically  signed by Rudean Haskell MD Signature Date/Time: 06/08/2021/5:41:40 PM    Final (Updated)    VAS [place] UPPER EXTREMITY VEIN MAPPING  Result Date: 06/05/2021 UPPER EXTREMITY VEIN MAPPING Patient Name:  Cathy [name]  Date of Exam:   06/05/2021 Medical Rec #: [id number]         Accession #:    [id number] Date of Birth: [birth year]        Patient Gender: F Patient Age:   39Y Exam Location:  [facility] Procedure:      VAS [place] UPPER EXT VEIN MAPPING (PRE-OP AVF) Referring Phys: [id number] [place] --------------------------------------------------------------------------------  Indications: Pre-access. History: ESRD.  Performing Technologist: Ronal Fear RVS, RCS  Examination Guidelines: A complete evaluation includes B-mode imaging, spectral Doppler, color Doppler, and power Doppler as needed of all accessible portions of each vessel. Bilateral testing is considered an integral part of a complete examination. Limited examinations for reoccurring indications may be performed as noted. +-----------------+-------------+----------+--------+ Right Cephalic   Diameter (cm)Depth (cm)Findings +-----------------+-------------+----------+--------+ Shoulder              0.25                        +-----------------+-------------+----------+--------+ Prox upper arm       0.21                        +-----------------+-------------+----------+--------+ Mid upper arm        0.19                        +-----------------+-------------+----------+--------+ Dist upper arm       0.18                        +-----------------+-------------+----------+--------+ Antecubital fossa    0.22                        +-----------------+-------------+----------+--------+ Prox forearm                            thrombus +-----------------+-------------+----------+--------+ Mid forearm                             thrombus +-----------------+-------------+----------+--------+ Dist forearm                            thrombus +-----------------+-------------+----------+--------+ +-----------------+-------------+----------+--------+ Right Basilic    Diameter (cm)Depth (cm)Findings +-----------------+-------------+----------+--------+ Mid upper arm        0.16                        +-----------------+-------------+----------+--------+ Dist upper arm       0.18                        +-----------------+-------------+----------+--------+ Antecubital fossa    0.17                        +-----------------+-------------+----------+--------+ +-----------------+-------------+----------+--------+ Left Cephalic    Diameter (cm)Depth (cm)Findings +-----------------+-------------+----------+--------+ Shoulder             0.18                        +-----------------+-------------+----------+--------+ Prox  upper arm       0.16                        +-----------------+-------------+----------+--------+ Mid upper arm        0.21                        +-----------------+-------------+----------+--------+ Dist upper arm       0.19                        +-----------------+-------------+----------+--------+ Antecubital  fossa    0.28                        +-----------------+-------------+----------+--------+ Prox forearm         0.16                        +-----------------+-------------+----------+--------+ Mid forearm          0.11                        +-----------------+-------------+----------+--------+ +-----------------+-------------+----------+--------+ Left Basilic     Diameter (cm)Depth (cm)Findings +-----------------+-------------+----------+--------+ Mid upper arm        0.18                        +-----------------+-------------+----------+--------+ Dist upper arm       0.16                        +-----------------+-------------+----------+--------+ Antecubital fossa    0.15                        +-----------------+-------------+----------+--------+ Summary: Right: Patent and compressible basilic vein.        The cephalic vein is thrombosed in the forearm. Left: Patent and compressible cephalic and basilic veins. *See table(s) above for measurements and observations.  Diagnosing physician: Deitra Mayo MD Electronically signed by Deitra Mayo MD on 06/05/2021 at 1:54:47 PM.    Final        ECHO as noted above Today EKG: NSR, baseline artifact, LVH Prior EKG 7/7: same  Assessment and Plan:   Pleuritic chest pain SLE- possible Lupus flare MSSA blood culture + Possible TV echodensity/vegetation with mild to moderate TR New finding- HFrEF, global dysfunction, LVEF 25-30% Mod MR Mild-moderate pericardial effusion (Lupus related and ESRD) Anasarca from ESRD ESRD on HD (TTS) HTN, HLD, Anxiety, asthma  Plan: - patient is currently admitted for work up of blood c/s + (MSSA+ 1/4), possible source is skin infections, lupus flare etc. She is admitted under medicine team and ID consultation. Her ECHO from today shows global LV dysfunction, visual EF looks worse 20%, estimated 25-30%. Etiology is unclear but possibly sec to underlying ESRD, HTN,  Antiinflammatory/infection (NICMP) etc. Given her young age- less likely iscehmic however needs to be ruled out.  - Pleuritic chest pain by history: check inflammatory markers, lupus markers, can give her Colchicine renal dose. If starting prednisone then  its suffice - in regards to New onset HFrEF: she is hypertensive  - CONTINUE TORSEMIDE '60MG'$  BID, she also needs to get HD- volume overloaded on exam. Volume removal to dry weight will be dialyssi dependent. Pericardial effusion, pleural and ascities are secondary to missed HD. Nothing to do for pericardial effusion- hope to resolve with  HD and SLE rx. - GDMT: begin coreg 12.'5mg'$ BID, continue olmesartan '40mg'$  daily. (Cant do entresto, sglt2 or spironolactone given ESRD). Would recommend repeating an ECHO on a later point once better from SLE flare and infection. She may need LHC/CA eventually as outpatient. Continue antiHTnsives as noted- if possible discontinue clonidine gradually.  Goal BP 130/80s - MSSA bacteremia- unclear source may be skin, however given finding of smal echodensity on TV: she may need TEE if recurrent bacteremia is present.  - f/w ID recs and other mx per hospitalist team  Call with questions.  Risk Assessment/Risk Scores:        New York Heart Association (NYHA) Functional Class NYHA Class II    [facility] will sign off.   Medication Recommendations: continue GDMT as tolerated Other recommendations (labs, testing, etc):  obtain labs as above Follow up as an outpatient:  will need to schedule prior to discharge  For questions or updates, please contact [place] Please consult [email or web address] for contact info under    Signed, Renae Fickle, MD  06/08/2021 6:34 PM

## 2021-06-08 NOTE — ED Notes (Signed)
Attempted report 

## 2021-06-08 NOTE — Progress Notes (Signed)
Pharmacy Antibiotic Note  Cathy [name] is a 40 y.o. female admitted on 06/08/2021 presenting with MSSA bacteremia.  Pharmacy has been consulted for cefazolin dosing.  ESRD-HD usually TTS  Plan: Cefazolin 2g IV x 1, then 1g IV q 24h Monitor repeat Cx, echo, ID recs and plan, and HD schedule     Temp (24hrs), Avg:97.9 F (36.6 C), Min:97.9 F (36.6 C), Max:97.9 F (36.6 C)  Recent Labs  Lab 06/06/21 1035 06/08/21 1430 06/08/21 1714  WBC 5.7 6.7  --   CREATININE 9.88* 9.90*  --   LATICACIDVEN 0.8 0.8 1.0    Estimated Creatinine Clearance: 7.6 mL/min (A) (by C-G formula based on SCr of 9.9 mg/dL (H)).    No Known Allergies  Bertis Ruddy, PharmD Clinical Pharmacist ED Pharmacist Phone # [phone] 06/08/2021 6:28 PM

## 2021-06-08 NOTE — H&P (Addendum)
History and Physical    Cathy [name] NUU:[id number] DOB: May 23, 1981 DOA: 06/08/2021  PCP: Pcp, No  Patient coming from: Home   I have personally briefly reviewed patient's old medical records in [place]  Chief Complaint: Positive blood culture from 06/06/2021  HPI: Cathy [name] is a 40 y.o. female with medical history significant of ESRD on HD TTS, lupus, anxiety, asthma, essential hypertension, GERD who presented to [facility] ED on 06/08/2021 for positive blood culture.  Patient was recently seen in the ED on 7/7 for a rash to her face and extremities and diagnosed with a lupus flare after having a fever at dialysis.  Blood cultures were drawn during that visit and patient was discharged home on doxycycline and a prednisone taper.  Patient was informed by infectious disease that she has a bloodstream infection and recommended to come back to the hospital for treatment.  Patient reports her last hemodialysis session was on Tuesday 06/04/2021.  In the ED, temperature 97.9 F, HR 73, RR 18, BP 159/115, SPO2 96% on room air.  WBC 6.7, hemoglobin 8.1, platelets 233.  Sodium 139, potassium 3.4, chloride 102, CO2 25, BUN 38, creatinine 9.90, glucose 87.  AST 31, ALT 16.  hCG less than 5.0.  Lactic acid 0.8.  Blood cultures x2 were ordered.  EDP discussed with ID, Dr. Baxter Flattery who recommended obtain TTE and start Ancef.  TRH consulted for further evaluation management.  Constitutional - + Fatigue, No Weight Loss Vision - No impaired vision, decreased visual acuity Ear/Nose/Mouth/Throat - No decreased hearing, no congestion Respiratory - No shortness of breath, no exertional dyspnea, chronic cough Cardiovascular - No chest pain, no palpitations, + peripheral edema Gastrointestinal - No nausea, + diarrhea, no constipation, Genitourinary - No excessive urination, no urinary incontinence Integumentary - + rashes and skin lesions noted to malar surface of face and lower extremities, chronic lesions to  bilateral upper extremities Neurologic - No numbness, no tingling, no dizziness, no headaches, no confusion or memory loss MSK: + Joint pains to bilateral knee/ankles   Past Medical History:  Diagnosis Date   Anxiety    Asthma    Bipolar depression ([place])    Depression    ESRD (end stage renal disease) on dialysis ([place]) 10/2020   Gonorrhea    Lupus ([place])    Pelvic inflammatory disease (PID)    Renal hypertension    Trichomonas infection     Past Surgical History:  Procedure Laterality Date   IR FLUORO GUIDE CV LINE RIGHT  11/30/2020   IR US GUIDE VASC ACCESS RIGHT  11/30/2020   RENAL BIOPSY     TUBAL LIGATION N/A 02/03/2019   Procedure: POST PARTUM TUBAL LIGATION;  Surgeon: Aletha Halim, MD;  Location: [facility] LD ORS;  Service: Gynecology;  Laterality: N/A;     reports that she quit smoking about 7 months ago. Her smoking use included cigarettes. She smoked an average of 0.25 packs per day. She has never used smokeless tobacco. She reports previous alcohol use. She reports previous drug use. Drug: Marijuana.  No Known Allergies  Family History  Problem Relation Age of Onset   Diabetes Maternal Grandmother    Hypertension Maternal Grandmother    Diabetes Maternal Grandfather    Hypertension Maternal Grandfather    Diabetes Paternal Grandmother    Hypertension Paternal Grandmother    Diabetes Paternal Grandfather    Hypertension Paternal Grandfather     Family history reviewed and not pertinent   Prior to Admission medications  Medication Sig Start Date End Date Taking? Authorizing Provider  albuterol (VENTOLIN HFA) 108 (90 Base) MCG/ACT inhaler INHALE 1-2 PUFFS INTO THE LUNGS 2 (TWO) TIMES DAILY. 12/31/20 12/31/21  Regalado, Belkys A, MD  amLODipine (NORVASC) 10 MG tablet TAKE 1 TABLET (10 MG TOTAL) BY MOUTH DAILY. 12/31/20 12/31/21  Regalado, Belkys A, MD  cloNIDine (CATAPRES) 0.3 MG tablet TAKE 1 TABLET (0.3 MG TOTAL) BY MOUTH 3 (THREE) TIMES DAILY. 12/31/20 12/31/21   Regalado, Belkys A, MD  Cyanocobalamin (B-12) 1000 MCG TABS TAKE 1 TABLET (100 MCG TOTAL) BY MOUTH DAILY. 12/31/20 12/31/21  Regalado, Jerald Kief A, MD  cyanocobalamin 100 MCG tablet Take 1 tablet (100 mcg total) by mouth daily. 12/31/20   Regalado, Belkys A, MD  doxycycline (VIBRAMYCIN) 100 MG capsule Take 1 capsule (100 mg total) by mouth 2 (two) times daily for 10 days. 06/06/21 06/16/21  Wyvonnia Dusky, MD  folic acid (FOLVITE) 1 MG tablet TAKE 1 TABLET (1 MG TOTAL) BY MOUTH DAILY. 12/31/20 12/31/21  Regalado, Belkys A, MD  guaiFENesin (MUCINEX) 600 MG 12 hr tablet TAKE 1 TABLET (600 MG TOTAL) BY MOUTH 2 (TWO) TIMES DAILY. 12/31/20 12/31/21  Regalado, Belkys A, MD  ipratropium (ATROVENT HFA) 17 MCG/ACT inhaler INHALE 2 PUFFS INTO THE LUNGS EVERY 6 (SIX) HOURS. 12/31/20 12/31/21  Regalado, Belkys A, MD  labetalol (NORMODYNE) 200 MG tablet TAKE 2 TABLETS (400 MG TOTAL) BY MOUTH 2 (TWO) TIMES DAILY. 12/31/20 12/31/21  Regalado, Belkys A, MD  olmesartan (BENICAR) 40 MG tablet Take 1 tablet (40 mg total) by mouth daily. 01/20/21   Mariel Aloe, MD  pantoprazole (PROTONIX) 40 MG tablet TAKE 1 TABLET (40 MG TOTAL) BY MOUTH DAILY. 12/31/20 12/31/21  Regalado, Belkys A, MD  predniSONE (DELTASONE) 10 MG tablet Take 6 tablets (60 mg total) by mouth daily with breakfast for 4 days, THEN 4 tablets (40 mg total) daily with breakfast for 4 days, THEN 2 tablets (20 mg total) daily with breakfast for 4 days, THEN 1 tablet (10 mg total) daily with breakfast for 4 days. 06/07/21 06/23/21  Wyvonnia Dusky, MD  predniSONE (DELTASONE) 20 MG tablet TAKE 2 TABLETS (40 MG TOTAL) BY MOUTH DAILY WITH BREAKFAST. 12/31/20 12/31/21  Regalado, Belkys A, MD  torsemide (DEMADEX) 20 MG tablet TAKE 3 TABLETS (60 MG TOTAL) BY MOUTH 2 (TWO) TIMES DAILY. 12/31/20 12/31/21  Regalado, Jerald Kief A, MD  Vitamin D3 (VITAMIN D) 25 MCG tablet TAKE 1 TABLET (1,000 UNITS TOTAL) BY MOUTH DAILY. 12/31/20 12/31/21  Regalado, Jerald Kief A, MD  fenofibrate 160 MG tablet Take 1  tablet (160 mg total) by mouth daily. 12/31/20 01/20/21  Elmarie Shiley, MD    Physical Exam: Vitals:   06/08/21 1419  BP: (!) 159/115  Pulse: 73  Resp: 18  Temp: 97.9 F (36.6 C)  SpO2: 96%    Constitutional: NAD, calm, comfortable, appears older than stated age Eyes: PERRL, lids and conjunctivae normal ENMT: Mucous membranes are dry. Posterior pharynx clear of any exudate or lesions.Normal dentition.  Neck: normal, supple, no masses, no thyromegaly Respiratory: clear to auscultation bilaterally, no wheezing, no crackles. Normal respiratory effort. No accessory muscle use.  On room air Cardiovascular: Regular rate and rhythm, no murmurs / rubs / gallops. 2+ extremity edema. 2+ pedal pulses. No carotid bruits.  Abdomen: no tenderness, no masses palpated. No hepatosplenomegaly. Bowel sounds positive.  Musculoskeletal: no clubbing / cyanosis. No joint deformity upper and lower extremities. Good ROM, no contractures. Normal muscle tone.  2+ pitting lower  extremity edema to knee that is tender to palpation Skin: Malar rash noted to face with erythema, tender to palpation, erythematous rash with lesions noted to bilateral lower extremities Neurologic: CN 2-12 grossly intact. Sensation intact, DTR normal. Strength 5/5 in all 4.  Psychiatric: Normal judgment and insight. Alert and oriented x 3. Normal mood.    Labs on Admission: I have personally reviewed following labs and imaging studies  CBC: Recent Labs  Lab 06/06/21 1035 06/08/21 1430  WBC 5.7 6.7  NEUTROABS 4.9 4.6  HGB 7.8* 8.1*  HCT 26.1* 27.5*  MCV 84.7 84.4  PLT 200 578   Basic Metabolic Panel: Recent Labs  Lab 06/06/21 1035 06/08/21 1430  NA 140 139  K 3.4* 3.4*  CL 104 102  CO2 27 25  GLUCOSE 98 87  BUN 29* 38*  CREATININE 9.88* 9.90*  CALCIUM 7.5* 7.4*   GFR: Estimated Creatinine Clearance: 7.6 mL/min (A) (by C-G formula based on SCr of 9.9 mg/dL (H)). Liver Function Tests: Recent Labs  Lab  06/06/21 1035 06/08/21 1430  AST 24 31  ALT 12 16  ALKPHOS 140* 156*  BILITOT 1.1 0.6  PROT 5.3* 5.8*  ALBUMIN 2.1* 2.3*   No results for input(s): LIPASE, AMYLASE in the last 168 hours. No results for input(s): AMMONIA in the last 168 hours. Coagulation Profile: Recent Labs  Lab 06/06/21 1035  INR 1.1   Cardiac Enzymes: No results for input(s): CKTOTAL, CKMB, CKMBINDEX, TROPONINI in the last 168 hours. BNP (last 3 results) No results for input(s): PROBNP in the last 8760 hours. HbA1C: No results for input(s): HGBA1C in the last 72 hours. CBG: No results for input(s): GLUCAP in the last 168 hours. Lipid Profile: No results for input(s): CHOL, HDL, LDLCALC, TRIG, CHOLHDL, LDLDIRECT in the last 72 hours. Thyroid Function Tests: No results for input(s): TSH, T4TOTAL, FREET4, T3FREE, THYROIDAB in the last 72 hours. Anemia Panel: No results for input(s): VITAMINB12, FOLATE, FERRITIN, TIBC, IRON, RETICCTPCT in the last 72 hours. Urine analysis:    Component Value Date/Time   COLORURINE YELLOW 11/29/2020 2054   APPEARANCEUR HAZY (A) 11/29/2020 2054   LABSPEC 1.015 11/29/2020 2054   PHURINE 6.0 11/29/2020 2054   GLUCOSEU NEGATIVE 11/29/2020 2054   HGBUR MODERATE (A) 11/29/2020 2054   [place] NEGATIVE 11/29/2020 2054   [place] 11/29/2020 2054   PROTEINUR >=300 (A) 11/29/2020 2054   UROBILINOGEN 0.2 10/17/2020 1026   NITRITE NEGATIVE 11/29/2020 2054   LEUKOCYTESUR NEGATIVE 11/29/2020 2054    Radiological Exams on Admission: No results found.  EKG: Independently reviewed.   Assessment/Plan Principal Problem:   MSSA (methicillin susceptible Staphylococcus aureus) septicemia (HCC) Active Problems:   Asthma   MDD (major depressive disorder)   Lupus (systemic lupus erythematosus) (HCC)   Diarrhea   ESRD (end stage renal disease) (HCC)   Benign essential HTN   MSSA septicemia, POA Patient instructed to represent to the ED with positive blood culture  obtained on 06/06/2021.  During previous ED visit, patient was seen for rash and joint pains with diagnosis of lupus flare; in which blood cultures were obtained and she was subsequently discharged home on doxycycline and prednisone taper.  Today, patient received call from infectious disease, Dr. Baxter Flattery regarding 1/4 (anaerobic bottle) positive for GPC's with BCID notable for MSSA.  Patient reports fever of 102.5 F at her HD session on Thursday.  She is afebrile today without leukocytosis with a normal lactic acid. --Admit inpatient, Med surg --ID consulted by EDP --TTE: Pending --  Repeat blood cultures x2 today --Follow-up further identification and susceptibilities from blood cultures on 7/7 --Ancef, pharmacy consulted for dosing monitoring --Patient with tunneled HD cath in place, likely will need line holiday; but may need HD prior to removal given last HD session on 7/5 --Repeat CBC in the a.m.  ESRD on HD TTS Patient dialyzes at [facility] clinic on a Tuesday/Thursday/Saturday schedule.  Follows with Dr. Hollie Salk outpatient.  Last HD session on 06/05/19/2022.  Patient was at dialysis on 7/7 with reported fever and sent to the ED for further evaluation in which she was diagnosed with a lupus flare and discharged home.  Did not receive dialysis today as she was instructed to come to the ED for positive blood culture. --Patient is hemodynamically stable, on room air, potassium 3.4 --Renal diet, fluid restriction 1200 mL/day --Nephrology consulted, Dr. Candiss Norse --Will likely need line holiday after dialysis given MSSA bacteremia as above  Lupus flare Patient recently seen in [facility] ED on 7/7 for concern of a rash on her face and extremities.  Initial onset on 05/31/2021.  Patient also with associated joint pains to bilateral lower extremities.  Patient was started on doxycycline and a prednisone taper.  Reports no significant improvement in symptoms. --Check CRP/ESR --Solu-Medrol 125 mg IV x1 today  followed by prednisone 40 mg p.o. daily --If no significant improvement in symptoms, may need to add hydroxychloroquine; but will likely need pharmacy input for dosing given ESRD  Cardiomyopathy Received message from cardiologist reading TTE that LVEF has dropped since previous echo report. --Cardiology to consult for further evaluation and recommendations; may need TEE given bacteremia as well --Follow-up TTE result  Essential hypertension --Clonidine 0.3 mg p.o. 3 times daily --Labetalol 200 mg p.o. twice daily --Torsemide $RemoveBeforeDEI'60mg'[id number]$  PO BID --Will hold home amlodipine due to cardiomyopathy --Hydralazine 25 mg p.o. q6h prn SBP >180 or DBP >110  Asthma Oxygenating well on room air. --Albuterol neb every 2 hours as needed wheezing/shortness of breath    DVT prophylaxis:   Heparin Code Status: Full code Family Communication: No family present at bedside Disposition Plan: Anticipate discharge home when medically ready Consults called: Infectious disease ([place]) called by EDP, personally discussed with Nephrology Dr. Candiss Norse, Cardiology Dr. Gasper Sells Admission status:  Level of care: Med-Surg   Severity of Illness: The appropriate patient status for this patient is INPATIENT. Inpatient status is judged to be reasonable and necessary in order to provide the required intensity of service to ensure the patient's safety. The patient's presenting symptoms, physical exam findings, and initial radiographic and laboratory data in the context of their chronic comorbidities is felt to place them at high risk for further clinical deterioration. Furthermore, it is not anticipated that the patient will be medically stable for discharge from the hospital within 2 midnights of admission. The following factors support the patient status of inpatient.   " The patient's presenting symptoms include malar/lower extremity rash, joint pains " The worrisome physical exam findings include rash to face/lower  extremity " The initial radiographic and laboratory data are worrisome because of positive blood cultures with MSSA " The chronic co-morbidities include ESRD on HD, lupus, asthma   * I certify that at the point of admission it is my clinical judgment that the patient will require inpatient hospital care spanning beyond 2 midnights from the point of admission due to high intensity of service, high risk for further deterioration and high frequency of surveillance required.*    Rashonda Warrior J [place] DO [facility] Available  via Epic secure chat 7am-7pm After these hours, please refer to coverage provider listed on [email or web address] 06/08/2021, 5:10 PM

## 2021-06-09 DIAGNOSIS — R7881 Bacteremia: Secondary | ICD-10-CM

## 2021-06-09 DIAGNOSIS — N049 Nephrotic syndrome with unspecified morphologic changes: Secondary | ICD-10-CM

## 2021-06-09 DIAGNOSIS — A4101 Sepsis due to Methicillin susceptible Staphylococcus aureus: Secondary | ICD-10-CM | POA: Diagnosis not present

## 2021-06-09 DIAGNOSIS — I10 Essential (primary) hypertension: Secondary | ICD-10-CM

## 2021-06-09 DIAGNOSIS — B9561 Methicillin susceptible Staphylococcus aureus infection as the cause of diseases classified elsewhere: Secondary | ICD-10-CM

## 2021-06-09 LAB — COMPREHENSIVE METABOLIC PANEL
ALT: 17 U/L (ref 0–44)
AST: 37 U/L (ref 15–41)
Albumin: 2.2 g/dL — ABNORMAL LOW (ref 3.5–5.0)
Alkaline Phosphatase: 199 U/L — ABNORMAL HIGH (ref 38–126)
Anion gap: 13 (ref 5–15)
BUN: 40 mg/dL — ABNORMAL HIGH (ref 6–20)
CO2: 23 mmol/L (ref 22–32)
Calcium: 7.6 mg/dL — ABNORMAL LOW (ref 8.9–10.3)
Chloride: 102 mmol/L (ref 98–111)
Creatinine, Ser: 9.58 mg/dL — ABNORMAL HIGH (ref 0.44–1.00)
GFR, Estimated: 5 mL/min — ABNORMAL LOW (ref >60)
Glucose, Bld: 168 mg/dL — ABNORMAL HIGH (ref 70–99)
Potassium: 4 mmol/L (ref 3.5–5.1)
Sodium: 138 mmol/L (ref 135–145)
Total Bilirubin: 0.6 mg/dL (ref 0.3–1.2)
Total Protein: 5.9 g/dL — ABNORMAL LOW (ref 6.5–8.1)

## 2021-06-09 LAB — CBC
HCT: 27 % — ABNORMAL LOW (ref 36.0–46.0)
Hemoglobin: 8.2 g/dL — ABNORMAL LOW (ref 12.0–15.0)
MCH: 25.2 pg — ABNORMAL LOW (ref 26.0–34.0)
MCHC: 30.4 g/dL (ref 30.0–36.0)
MCV: 83.1 fL (ref 80.0–100.0)
Platelets: 219 10*3/uL (ref 150–400)
RBC: 3.25 MIL/uL — ABNORMAL LOW (ref 3.87–5.11)
RDW: 20.9 % — ABNORMAL HIGH (ref 11.5–15.5)
WBC: 5.7 10*3/uL (ref 4.0–10.5)
nRBC: 0 % (ref 0.0–0.2)

## 2021-06-09 LAB — CULTURE, BLOOD (ROUTINE X 2): Special Requests: ADEQUATE

## 2021-06-09 LAB — IRON AND TIBC
Iron: 56 ug/dL (ref 28–170)
Saturation Ratios: 27 % (ref 10.4–31.8)
TIBC: 210 ug/dL — ABNORMAL LOW (ref 250–450)
UIBC: 154 ug/dL

## 2021-06-09 LAB — MAGNESIUM: Magnesium: 1.7 mg/dL (ref 1.7–2.4)

## 2021-06-09 MED ORDER — TRIAMCINOLONE ACETONIDE 0.025 % EX CREA
TOPICAL_CREAM | Freq: Two times a day (BID) | CUTANEOUS | Status: DC
  Filled 2021-06-09: qty 15

## 2021-06-09 MED ORDER — PROSOURCE PLUS PO LIQD
30.0000 mL | Freq: Three times a day (TID) | ORAL | Status: DC
  Administered 2021-06-09 – 2021-06-14 (×9): 30 mL via ORAL
  Filled 2021-06-09 (×11): qty 30

## 2021-06-09 MED ORDER — CHLORHEXIDINE GLUCONATE CLOTH 2 % EX PADS
6.0000 | MEDICATED_PAD | Freq: Every day | CUTANEOUS | Status: DC
  Administered 2021-06-10 – 2021-06-14 (×5): 6 via TOPICAL

## 2021-06-09 MED ORDER — LIDOCAINE HCL (PF) 1 % IJ SOLN
5.0000 mL | INTRAMUSCULAR | Status: DC | PRN
  Filled 2021-06-09: qty 5

## 2021-06-09 MED ORDER — DOXERCALCIFEROL 4 MCG/2ML IV SOLN
5.0000 ug | INTRAVENOUS | Status: DC
  Filled 2021-06-09 (×3): qty 4

## 2021-06-09 MED ORDER — HEPARIN SODIUM (PORCINE) 1000 UNIT/ML DIALYSIS
1000.0000 [IU] | INTRAMUSCULAR | Status: DC | PRN
  Filled 2021-06-09: qty 1

## 2021-06-09 MED ORDER — DARBEPOETIN ALFA 150 MCG/0.3ML IJ SOSY
150.0000 ug | PREFILLED_SYRINGE | INTRAMUSCULAR | Status: DC
  Filled 2021-06-09: qty 0.3

## 2021-06-09 MED ORDER — ALTEPLASE 2 MG IJ SOLR
2.0000 mg | Freq: Once | INTRAMUSCULAR | Status: DC | PRN
  Filled 2021-06-09: qty 2

## 2021-06-09 MED ORDER — LIDOCAINE-PRILOCAINE 2.5-2.5 % EX CREA
1.0000 "application " | TOPICAL_CREAM | CUTANEOUS | Status: DC | PRN
  Filled 2021-06-09: qty 5

## 2021-06-09 MED ORDER — PENTAFLUOROPROP-TETRAFLUOROETH EX AERO: 1.0000 "application " | INHALATION_SPRAY | CUTANEOUS | Status: DC | PRN

## 2021-06-09 MED ORDER — ALUM & MAG HYDROXIDE-SIMETH 200-200-20 MG/5ML PO SUSP
30.0000 mL | Freq: Four times a day (QID) | ORAL | Status: DC | PRN
  Administered 2021-06-09: 30 mL via ORAL
  Filled 2021-06-09: qty 30

## 2021-06-09 MED ORDER — SODIUM CHLORIDE 0.9 % IV SOLN: 100.0000 mL | INTRAVENOUS | Status: DC | PRN

## 2021-06-09 MED ORDER — CALCIUM ACETATE (PHOS BINDER) 667 MG PO CAPS
1334.0000 mg | ORAL_CAPSULE | Freq: Three times a day (TID) | ORAL | Status: DC
  Administered 2021-06-09 – 2021-06-13 (×9): 1334 mg via ORAL
  Filled 2021-06-09 (×17): qty 2

## 2021-06-09 MED ORDER — CLOBETASOL PROPIONATE 0.05 % EX CREA
TOPICAL_CREAM | Freq: Two times a day (BID) | CUTANEOUS | Status: DC
  Filled 2021-06-09 (×2): qty 15

## 2021-06-09 NOTE — Progress Notes (Addendum)
Tried calling dialysis department several times to confirm pt's dialysis for today; but no answer. Bp meds then given after the many unsuccessful attempts.

## 2021-06-09 NOTE — Progress Notes (Signed)
RN called Dialysis department to confirm pt's dialysis today but no answer.

## 2021-06-09 NOTE — Consult Note (Signed)
[facility] Renal Consultation Note    Indication for Consultation:  Management of ESRD/hemodialysis; anemia, hypertension/volume and secondary hyperparathyroidism  PCP:Pcp, No  HPI: Cathy [name] is a 40 y.o. female.  with ESRD on HD TTS at [facility].  Her past medical history is significant for lupus, asthma, HTN, anxiety, GERD.  On 06/06/2021, patient presents to the emergency room with fever of 102 and c/o rash within the face and extremities. Concerning for lupus flare, she was discharged home on doxycycline and prednisone taper. On 06/08/21, patient was instructed by infectious disease to return back to ER due to (+) blood cx showing MSSA.  Patient reports last hemodialysis treatment was on 06/04/21; however, reviewed outpatient hemodialysis records and noted last hemodialysis treatment on 05/30/2021.  Patient with history of noncompliance with hemodialysis treatments.  Seen and examined patient at bedside this morning.  She is now on O2 via nasal cannula.  She reports abdominal distention but denies SOB, CP, and N/V.  Lab work includes: Lactic acid 1.0, K+ 4.0, BUN 40, Ca 7.6, Na 138, and Hgb 8.2. CXR shows small pleural effusions (right more than left). Recent ECHO showed worsening EF 25-30% and small echodensity TV. Both ID and Cardiology on board. Plan for HD this evening (2hrs) then full treatment tomorrow 06/10/21 after which she will have a line holiday. Plan for catheter placement on 7/13 and HD will then be resumed.  Past Medical History:  Diagnosis Date   Anasarca associated with disorder of kidney 06/08/2021   Anxiety    Asthma    Bipolar depression ([place])    Depression    ESRD (end stage renal disease) on dialysis ([place]) 10/2020   Gonorrhea    Lupus (HCC)    Pelvic inflammatory disease (PID)    Pleural effusion 06/08/2021   Renal hypertension    Trichomonas infection    Past Surgical History:  Procedure Laterality Date   IR FLUORO GUIDE CV LINE  RIGHT  11/30/2020   IR US GUIDE VASC ACCESS RIGHT  11/30/2020   RENAL BIOPSY     TUBAL LIGATION N/A 02/03/2019   Procedure: POST PARTUM TUBAL LIGATION;  Surgeon: Aletha Halim, MD;  Location: [facility] LD ORS;  Service: Gynecology;  Laterality: N/A;   Family History  Problem Relation Age of Onset   Diabetes Maternal Grandmother    Hypertension Maternal Grandmother    Diabetes Maternal Grandfather    Hypertension Maternal Grandfather    Diabetes Paternal Grandmother    Hypertension Paternal Grandmother    Diabetes Paternal Grandfather    Hypertension Paternal Grandfather    Social History:  reports that she quit smoking about 8 months ago. Her smoking use included cigarettes. She smoked an average of 0.25 packs per day. She has never used smokeless tobacco. She reports previous alcohol use. She reports previous drug use. Drug: Marijuana. No Known Allergies Prior to Admission medications   Medication Sig Start Date End Date Taking? Authorizing Provider  acetaminophen (TYLENOL) 500 MG tablet Take 1,000 mg by mouth every 6 (six) hours as needed for moderate pain.   Yes [provider]  albuterol (VENTOLIN HFA) 108 (90 Base) MCG/ACT inhaler INHALE 1-2 PUFFS INTO THE LUNGS 2 (TWO) TIMES DAILY. Patient taking differently: Inhale 2 puffs into the lungs 2 (two) times daily. 12/31/20 12/31/21 Yes Regalado, Belkys A, MD  amLODipine (NORVASC) 10 MG tablet TAKE 1 TABLET (10 MG TOTAL) BY MOUTH DAILY. Patient taking differently: Take 10 mg by mouth daily. 12/31/20 12/31/21 Yes  Regalado, Belkys A, MD  cloNIDine (CATAPRES) 0.3 MG tablet TAKE 1 TABLET (0.3 MG TOTAL) BY MOUTH 3 (THREE) TIMES DAILY. Patient taking differently: Take 0.3 mg by mouth 3 (three) times daily. 12/31/20 12/31/21 Yes Regalado, Belkys A, MD  ipratropium (ATROVENT HFA) 17 MCG/ACT inhaler INHALE 2 PUFFS INTO THE LUNGS EVERY 6 (SIX) HOURS. Patient taking differently: Inhale 2 puffs into the lungs every 6 (six) hours as needed for  wheezing. 12/31/20 12/31/21 Yes Regalado, Belkys A, MD  labetalol (NORMODYNE) 200 MG tablet TAKE 2 TABLETS (400 MG TOTAL) BY MOUTH 2 (TWO) TIMES DAILY. Patient taking differently: Take 400 mg by mouth 2 (two) times daily. 12/31/20 12/31/21 Yes Regalado, Belkys A, MD  pantoprazole (PROTONIX) 40 MG tablet TAKE 1 TABLET (40 MG TOTAL) BY MOUTH DAILY. Patient taking differently: Take 40 mg by mouth daily. 12/31/20 12/31/21 Yes Regalado, Belkys A, MD  torsemide (DEMADEX) 20 MG tablet TAKE 3 TABLETS (60 MG TOTAL) BY MOUTH 2 (TWO) TIMES DAILY. Patient taking differently: Take 60 mg by mouth 2 (two) times daily. 12/31/20 12/31/21 Yes Regalado, Belkys A, MD  Cyanocobalamin (B-12) 1000 MCG TABS TAKE 1 TABLET (100 MCG TOTAL) BY MOUTH DAILY. Patient not taking: No sig reported 12/31/20 12/31/21  Regalado, Jerald Kief A, MD  cyanocobalamin 100 MCG tablet Take 1 tablet (100 mcg total) by mouth daily. Patient not taking: No sig reported 12/31/20   Regalado, Belkys A, MD  doxycycline (VIBRAMYCIN) 100 MG capsule Take 1 capsule (100 mg total) by mouth 2 (two) times daily for 10 days. 06/06/21 06/16/21  Wyvonnia Dusky, MD  folic acid (FOLVITE) 1 MG tablet TAKE 1 TABLET (1 MG TOTAL) BY MOUTH DAILY. Patient not taking: No sig reported 12/31/20 12/31/21  Regalado, Belkys A, MD  guaiFENesin (MUCINEX) 600 MG 12 hr tablet TAKE 1 TABLET (600 MG TOTAL) BY MOUTH 2 (TWO) TIMES DAILY. Patient not taking: No sig reported 12/31/20 12/31/21  Regalado, Jerald Kief A, MD  olmesartan (BENICAR) 40 MG tablet Take 1 tablet (40 mg total) by mouth daily. Patient not taking: No sig reported 01/20/21   Mariel Aloe, MD  predniSONE (DELTASONE) 10 MG tablet Take 6 tablets (60 mg total) by mouth daily with breakfast for 4 days, THEN 4 tablets (40 mg total) daily with breakfast for 4 days, THEN 2 tablets (20 mg total) daily with breakfast for 4 days, THEN 1 tablet (10 mg total) daily with breakfast for 4 days. 06/07/21 06/23/21  Wyvonnia Dusky, MD  predniSONE  (DELTASONE) 20 MG tablet TAKE 2 TABLETS (40 MG TOTAL) BY MOUTH DAILY WITH BREAKFAST. Patient not taking: No sig reported 12/31/20 12/31/21  Regalado, Jerald Kief A, MD  Vitamin D3 (VITAMIN D) 25 MCG tablet TAKE 1 TABLET (1,000 UNITS TOTAL) BY MOUTH DAILY. Patient not taking: No sig reported 12/31/20 12/31/21  Regalado, Jerald Kief A, MD  fenofibrate 160 MG tablet Take 1 tablet (160 mg total) by mouth daily. 12/31/20 01/20/21  Elmarie Shiley, MD   Current Facility-Administered Medications  Medication Dose Route Frequency Provider Last Rate Last Admin   0.9 %  sodium chloride infusion  100 mL Intravenous PRN Tobie Poet E, NP       0.9 %  sodium chloride infusion  100 mL Intravenous PRN Adelfa Koh, NP       acetaminophen (TYLENOL) tablet 650 mg  650 mg Oral Q6H PRN [place] Eric J, DO   650 mg at 06/09/21 0730   Or   acetaminophen (TYLENOL) suppository 650 mg  650 mg  Rectal Q6H PRN [place] Donnamarie Poag, DO       albuterol (PROVENTIL) (2.5 MG/3ML) 0.083% nebulizer solution 2.5 mg  2.5 mg Nebulization Q2H PRN [place] J, DO       alteplase (CATHFLO ACTIVASE) injection 2 mg  2 mg Intracatheter Once PRN Adelfa Koh, NP       ceFAZolin (ANCEF) IVPB 1 g/50 mL premix  1 g Intravenous Q24H Bertis Ruddy, RPH       Chlorhexidine Gluconate Cloth 2 % PADS 6 each  6 each Topical Q0600 Adelfa Koh, NP       clobetasol cream (TEMOVATE) 0.05 %   Topical BID Carlyle Basques, MD       cloNIDine (CATAPRES) tablet 0.3 mg  0.3 mg Oral TID [place] Eric J, DO   0.3 mg at 06/09/21 [id number]   heparin injection 1,000 Units  1,000 Units Dialysis PRN Adelfa Koh, NP       heparin injection 5,000 Units  5,000 Units Subcutaneous Q8H [place] Eric J, DO   5,000 Units at 06/09/21 [id number]   hydrALAZINE (APRESOLINE) tablet 25 mg  25 mg Oral Q6H PRN [place] Eric J, DO   25 mg at 06/09/21 [id number]   labetalol (NORMODYNE) tablet 400 mg  400 mg Oral BID [place] Eric J, DO   400 mg at 06/09/21 0914    lidocaine (PF) (XYLOCAINE) 1 % injection 5 mL  5 mL Intradermal PRN Adelfa Koh, NP       lidocaine-prilocaine (EMLA) cream 1 application  1 application Topical PRN Adelfa Koh, NP       ondansetron [facility]) tablet 4 mg  4 mg Oral Q6H PRN [place] Eric J, DO       Or   ondansetron [facility]) injection 4 mg  4 mg Intravenous Q6H PRN [place] J, DO       oxyCODONE (Oxy IR/ROXICODONE) immediate release tablet 5 mg  5 mg Oral Q4H PRN [place] Donnamarie Poag, DO   5 mg at 06/09/21 [id number]   pantoprazole (PROTONIX) EC tablet 40 mg  40 mg Oral Daily [place] Eric J, DO   40 mg at 06/09/21 [id number]   pentafluoroprop-tetrafluoroeth (GEBAUERS) aerosol 1 application  1 application Topical PRN Adelfa Koh, NP       polyethylene glycol (MIRALAX / GLYCOLAX) packet 17 g  17 g Oral Daily PRN [place] Eric J, DO       predniSONE (DELTASONE) tablet 40 mg  40 mg Oral Q breakfast [place] Donnamarie Poag, DO   40 mg at 06/09/21 0914   torsemide (DEMADEX) tablet 60 mg  60 mg Oral BID [place] Eric J, DO   60 mg at 06/09/21 1353   triamcinolone (KENALOG) 0.025 % cream   Topical BID Carlyle Basques, MD       Labs: Basic Metabolic Panel: Recent Labs  Lab 06/06/21 1035 06/08/21 1430 06/08/21 1923 06/09/21 0356  NA 140 139  --  138  K 3.4* 3.4*  --  4.0  CL 104 102  --  102  CO2 27 25  --  23  GLUCOSE 98 87  --  168*  BUN 29* 38*  --  40*  CREATININE 9.88* 9.90* 9.38* 9.58*  CALCIUM 7.5* 7.4*  --  7.6*   Liver Function Tests: Recent Labs  Lab 06/06/21 1035 06/08/21 1430 06/09/21 0356  AST 24 31 37  ALT '12 16 17  '$ ALKPHOS 140* 156* 199*  BILITOT 1.1 0.6 0.6  PROT 5.3* 5.8* 5.9*  ALBUMIN 2.1* 2.3* 2.2*   No results for input(s): LIPASE, AMYLASE in the last 168 hours. No results for input(s): AMMONIA in the last 168 hours. CBC: Recent Labs  Lab 06/06/21 1035 06/08/21 1430 06/08/21 1923 06/09/21 0356  WBC 5.7 6.7 6.6 5.7  NEUTROABS 4.9 4.6  --   --   HGB 7.8* 8.1* 8.7* 8.2*  HCT 26.1*  27.5* 29.5* 27.0*  MCV 84.7 84.4 83.6 83.1  PLT 200 233 248 219   Cardiac Enzymes: No results for input(s): CKTOTAL, CKMB, CKMBINDEX, TROPONINI in the last 168 hours. CBG: No results for input(s): GLUCAP in the last 168 hours. Iron Studies: No results for input(s): IRON, TIBC, TRANSFERRIN, FERRITIN in the last 72 hours. Studies/Results: ECHOCARDIOGRAM COMPLETE  Result Date: 06/08/2021    ECHOCARDIOGRAM REPORT   Patient Name:   Cathy [name] Date of Exam: 06/08/2021 Medical Rec #:  [id number]        Height:       67.0 in Accession #:    [id number]       Weight:       147.0 lb Date of Birth:  1981/10/13       BSA:          1.774 m Patient Age:    10 years         BP:           155/119 mmHg Patient Gender: F                HR:           70 bpm. Exam Location:  Inpatient Procedure: 2D Echo, Cardiac Doppler and Color Doppler Indications:     Endocarditis  History:         Patient has prior history of Echocardiogram examinations, most                  recent 11/15/2020. Risk Factors:Former Smoker. Substance abuse.                  Anasarca. Lupus. Moderate pericardial effusion.  Sonographer:     Merrie Roof RDCS Referring Phys:  [id number] Janeece Fitting Diagnosing Phys: Rudean Haskell MD IMPRESSIONS  1. Left ventricular ejection fraction, by estimation, is 25 to 30%. The left ventricle has severely decreased function. The left ventricle demonstrates global hypokinesis. There is mild concentric left ventricular hypertrophy. Left ventricular diastolic  parameters are consistent with Grade II diastolic dysfunction (pseudonormalization).  2. Right ventricular systolic function is normal. The right ventricular size is normal. Mildly increased right ventricular wall thickness. There is normal pulmonary artery systolic pressure.  3. Left atrial size was severely dilated.  4. Right atrial size was severely dilated.  5. Moderate pericardial effusion. There is no evidence of cardiac tamponade.  6. The mitral valve is  grossly normal. Mild to moderate mitral valve regurgitation.  7. The tricuspid valve is abnormal- small 0.44 echodensity seen on the ventricular side of the tricuspid valve (Image 19/85. Tricuspid valve regurgitation is mild to moderate.  8. The aortic valve is tricuspid. Aortic valve regurgitation is mild. No aortic stenosis is present. Comparison(s): A prior study was performed on 11/15/2020. Prior images reviewed side by side. LVEF is significantly worse. Small echo density on tricuspid valve was only seen in PSAX axis (not well visualized in prior study). Conclusion(s)/Recommendation(s): TEE could be considered given the clinical question of endocarditis. Addendum: Primary team updated. FINDINGS  Left Ventricle: Left ventricular ejection fraction, by estimation, is 25 to  30%. The left ventricle has severely decreased function. The left ventricle demonstrates global hypokinesis. The left ventricular internal cavity size was normal in size. There is mild concentric left ventricular hypertrophy. Left ventricular diastolic parameters are consistent with Grade II diastolic dysfunction (pseudonormalization). Right Ventricle: The right ventricular size is normal. Mildly increased right ventricular wall thickness. Right ventricular systolic function is normal. There is normal pulmonary artery systolic pressure. The tricuspid regurgitant velocity is 2.71 m/s, and with an assumed right atrial pressure of 3 mmHg, the estimated right ventricular systolic pressure is [id number] mmHg. Left Atrium: Left atrial size was severely dilated. Right Atrium: Right atrial size was severely dilated. Pericardium: A moderately sized pericardial effusion is present. There is no evidence of cardiac tamponade. Mitral Valve: The mitral valve is grossly normal. Mild to moderate mitral valve regurgitation. Tricuspid Valve: Small 0.44 echodensity seen on the ventricular side of the tricuspid valve (Image 19/85). The tricuspid valve is abnormal.  Tricuspid valve regurgitation is mild to moderate. No evidence of tricuspid stenosis. Aortic Valve: The aortic valve is tricuspid. Aortic valve regurgitation is mild. No aortic stenosis is present. Aortic valve mean gradient measures 5.0 mmHg. Aortic valve peak gradient measures 8.3 mmHg. Aortic valve area, by VTI measures 2.08 cm. Pulmonic Valve: The pulmonic valve was normal in structure. Pulmonic valve regurgitation is mild. No evidence of pulmonic stenosis. Aorta: The aortic root and ascending aorta are structurally normal, with no evidence of dilitation. IAS/Shunts: The atrial septum is grossly normal.  LEFT VENTRICLE PLAX 2D LVIDd:         5.40 cm  Diastology LVIDs:         4.70 cm  LV e' medial:    6.64 cm/s LV PW:         1.20 cm  LV E/e' medial:  17.2 LV IVS:        1.10 cm  LV e' lateral:   7.51 cm/s LVOT diam:     1.90 cm  LV E/e' lateral: 15.2 LV SV:         62 LV SV Index:   35 LVOT Area:     2.84 cm  RIGHT VENTRICLE             IVC RV Basal diam:  5.30 cm     IVC diam: 2.00 cm RV Mid diam:    3.40 cm RV S prime:     15.10 cm/s TAPSE (M-mode): 2.8 cm LEFT ATRIUM            Index       RIGHT ATRIUM           Index LA diam:      4.60 cm  2.59 cm/m  RA Area:     28.60 cm LA Vol (A2C): 97.2 ml  54.78 ml/m RA Volume:   111.00 ml 62.55 ml/m LA Vol (A4C): 116.0 ml 65.37 ml/m  AORTIC VALVE AV Area (Vmax):    2.42 cm AV Area (Vmean):   2.07 cm AV Area (VTI):     2.08 cm AV Vmax:           144.00 cm/s AV Vmean:          99.200 cm/s AV VTI:            0.299 m AV Peak Grad:      8.3 mmHg AV Mean Grad:      5.0 mmHg LVOT Vmax:         123.00 cm/s LVOT Vmean:  72.300 cm/s LVOT VTI:          0.219 m LVOT/AV VTI ratio: 0.73  AORTA Ao Root diam: 3.30 cm Ao Asc diam:  3.40 cm MITRAL VALVE                TRICUSPID VALVE MV Area (PHT): 4.80 cm     TR Peak grad:   29.4 mmHg MV Decel Time: 158 msec     TR Vmax:        271.00 cm/s MV E velocity: 114.00 cm/s MV A velocity: 69.20 cm/s   SHUNTS MV E/A ratio:   1.65         Systemic VTI:  0.22 m                             Systemic Diam: 1.90 cm Rudean Haskell MD Electronically signed by Rudean Haskell MD Signature Date/Time: 06/08/2021/5:41:40 PM    Final (Updated)     Physical Exam: Vitals:   06/08/21 2350 06/09/21 0407 06/09/21 0700 06/09/21 1200  BP: (!) 150/112 (!) 155/118 (!) 157/114 (!) 168/126  Pulse: 64 67 72 71  Resp: '16 16 18 17  '$ Temp: 98.1 F (36.7 C) 98.3 F (36.8 C) 98.4 F (36.9 C) 97.7 F (36.5 C)  TempSrc: Oral Oral Oral Axillary  SpO2: 100% 100% 100% 100%     General: WDWN NAD Head: NCAT sclera not icteric MMM Lungs: Diminished lower bases. No wheeze, rales or rhonchi. Breathing is unlabored-on O2 via [place] Heart: RRR. No murmur, rubs or gallops.  Abdomen: round and distended, soft, nontender, +bowel sounds Lower extremities: no edema BLLE Neuro: AAOx3. Moves all extremities spontaneously. Psych:  Responds to questions appropriately with a normal affect. Dialysis Access: R TDC Skin: Rash noted within face and extremities; open areas also noted BLLE-areas tender upon palpation.  Dialysis Orders:  TTS - [facility]  4hrs, BFR 400, DFR 800,  EDW 67kg, 2K/ 2Ca  Mircera 200 mcg q2wks - last 05/28/21 Hectorol 68mg IV qHD - last 05/30/21 Venofer '50mg'$  IV weekly - last 05/28/21 Calcium Acetate '667mg'$  2 tabs TID with meals  Last Labs: Hgb 8.2, TSAT 11 (OP HD 05/07/21), K 4.0, Ca 7.6, Alb 2.2  Assessment/Plan: MSSA Bacteremia: Blood Cx 7/7 (+) MSSA sensitive to oxacillin. Blood cultures X 2 were repeated 7/9-results pending. ID consulted. Continue cefazolin (renally dosed). Lupus Flare/Systemic Rash: Continue Prednisone New Onset HFrEF: ECHO 7/9 showed EF 25-30% (worsening), grade 2 diastolic dysfunction, moderate pleural effusion-no evidence of cardiac tamponade, and small 0.44 echodensity TV-will need TEE. Cardiology consulted. ESRD - on HD TTS. Patient with known history of noncompliance with HD.  Reviewed HD outpatient records. Noted last HD on 05/30/21. Patient not in acute respiratory distress but given she hasn't had HD in several days, HD will need to happen prior to line holiday. Plan for HD (2hrs) this evening then full HD tomorrow 06/10/21. Patient then will receive a line holiday after HD on 7/11. HD catheter to be placed on 7/13 and HD will be resume. We will continue to watch patient closely on volume status. Hypertension/volume  - Blood pressures elevated. I suspect d/t increase volume from missing HD. Plan for HD (2hrs) this evening then full treatment tomorrow 7/11.  Anemia of CKD - Hgb now 8.2-will resume Aranesp. First dose to be given 7/11. Secondary Hyperparathyroidism - Corr Ca ok, will check PO4 level in AM, will resume VDRA and binders. Nutrition -  Albumin low 2.2-will add protein supplements. Continue renal diet with fluid restriction.  Tobie Poet, NP [facility] 06/09/2021, 2:08 PM

## 2021-06-09 NOTE — Progress Notes (Signed)
PROGRESS NOTE  POETRY SIS [id number] DOB: 06-Apr-1981 DOA: 06/08/2021 PCP: Pcp, No   LOS: 1 day   Brief Narrative / Interim history: 40 year old female with history of lupus, ESRD on HD TTS since January 2022, asthma, HTN, comes into the hospital after being called at home that her blood cultures were positive.  On 7/7 she went to dialysis, had a fever of 102, new onset rash to her face and legs and was sent to the ED.  She was diagnosed with a lupus flare, was placed on steroids as well as doxycycline and sent home.  Blood cultures were obtained during that ED visit, and returned positive for MSSA.  Patient was called and was admitted to the hospital on 7/9.  Subjective / 24h Interval events: She is feeling uncomfortable this morning, complains of abdominal fullness as well as bilateral leg pain and swelling.  Assessment & Plan: Principal Problem MSSA bacteremia, POA-patient not septic on admission, she is afebrile, no leukocytosis, not tachycardic nor tachypneic -ID consulted, appreciate input.  Blood cultures were repeated during admission, now pending -Blood cultures on 7/7 finalized this morning and confirmed MSSA sensitive to oxacillin.  She was placed on Ancef, continue -We will coordinate with ID and nephrology regarding line holiday -2D echo done 7/9 showed EF 25-30% with severely decreased LV function/global hypokinesis, grade 2 diastolic dysfunction.  RV was normal.  She had a moderate pericardial effusion without evidence of cardiac tamponade.  Her tricuspid valve showed a small 0.44 echodensity on the ventricular side of the tricuspid valve.  Will need TEE  Active Problems ESRD on HD TTS-she is followed by Dr. Hollie Salk as an outpatient.  This is likely due to her lupus.  She has a tunneled HD cath in place that she has had since January when she was started on HD.  She was hopeful for renal recovery and was somewhat resistant to having place an AV fistula. -She seems somewhat  resistant to line removal/line holiday today -Discussed with nephrology, will be dialyzed today and then again tomorrow   Lupus flare /systemic rash -she also has associated bilateral bilateral joint pains. She was given steroids in the ER, continue prednisone. 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  Her pericardial effusion is likely in the setting of lupus, she is on steroids. -Continue torsemide, will need ongoing HD for volume removal   Essential hypertension -Blood pressure acceptable but slightly on the high side.  Continue regimen as below   Asthma -No wheezing, this appears stable   Scheduled Meds:  Chlorhexidine Gluconate Cloth  6 each Topical Q0600   cloNIDine  0.3 mg Oral TID   heparin  5,000 Units Subcutaneous Q8H   labetalol  400 mg Oral BID   pantoprazole  40 mg Oral Daily   predniSONE  40 mg Oral Q breakfast   torsemide  60 mg Oral BID   Continuous Infusions:  sodium chloride     sodium chloride      ceFAZolin (ANCEF) IV     PRN Meds:.sodium chloride, sodium chloride, acetaminophen **OR** acetaminophen, albuterol, alteplase, heparin, hydrALAZINE, lidocaine (PF), lidocaine-prilocaine, ondansetron **OR** ondansetron (ZOFRAN) IV, oxyCODONE, pentafluoroprop-tetrafluoroeth, polyethylene glycol  Diet Orders (From admission, onward)     Start     Ordered   06/08/21 1843  Diet renal with fluid restriction Fluid restriction: 1200 mL Fluid; Room service appropriate? Yes; Fluid consistency: Thin  Diet effective now  Question Answer Comment  Fluid restriction: 1200 mL Fluid   Room service appropriate? Yes   Fluid consistency: Thin      06/08/21 1842            DVT prophylaxis: heparin injection 5,000 Units Start: 06/08/21 2200     Code Status: Full Code  Family Communication: No family at bedside  Status is: Inpatient  Remains inpatient appropriate  because:Inpatient level of care appropriate due to severity of illness  Dispo: The patient is from: Home              Anticipated d/c is to: Home              Patient currently is not medically stable to d/c.   Difficult to place patient No   Level of care: Med-Surg  Consultants:  Infectious disease Nephrology Cardiology  Procedures:  2D echo  Microbiology  Blood cultures 7/7-MSSA Blood culture 7/9-no growth to date  Antimicrobials: Cefazolin 7/9 >>  Objective: Vitals:   06/08/21 1953 06/08/21 2350 06/09/21 0407 06/09/21 0700  BP: (!) 152/110 (!) 150/112 (!) 155/118 (!) 157/114  Pulse: 79 64 67 72  Resp: '17 16 16 18  '$ Temp: 98.9 F (37.2 C) 98.1 F (36.7 C) 98.3 F (36.8 C) 98.4 F (36.9 C)  TempSrc: Oral Oral Oral Oral  SpO2: 97% 100% 100% 100%    Intake/Output Summary (Last 24 hours) at 06/09/2021 1013 Last data filed at 06/08/2021 1745 Gross per 24 hour  Intake 97.99 ml  Output --  Net 97.99 ml   There were no vitals filed for this visit.  Examination:  Constitutional: NAD Eyes: no scleral icterus ENMT: Mucous membranes are moist.  Neck: normal, supple Respiratory: Faint bibasilar crackles, no wheezing heard.  Normal respiratory effort Cardiovascular: Regular rate and rhythm, no murmurs / rubs / gallops.  1+ edema Abdomen: Mildly distended, Bowel sounds positive.  Musculoskeletal: no clubbing / cyanosis.  Skin: Erythematous raised rash on malar surfaces, nose, upper lip, on the forehead as well as lower extremities with few small ulcerated lesions on various stages of healing on bilateral lower extremities.  No mucosal involvement noted Neurologic: CN 2-12 grossly intact. Strength 5/5 in all 4.  Psychiatric: Normal judgment and insight. Alert and oriented x 3. Normal mood.   Data Reviewed: I have independently reviewed following labs and imaging studies   CBC: Recent Labs  Lab 06/06/21 1035 06/08/21 1430 06/08/21 1923 06/09/21 0356  WBC 5.7 6.7  6.6 5.7  NEUTROABS 4.9 4.6  --   --   HGB 7.8* 8.1* 8.7* 8.2*  HCT 26.1* 27.5* 29.5* 27.0*  MCV 84.7 84.4 83.6 83.1  PLT 200 233 248 [id number]   Basic Metabolic Panel: Recent Labs  Lab 06/06/21 1035 06/08/21 1430 06/08/21 1923 06/09/21 0356  NA 140 139  --  138  K 3.4* 3.4*  --  4.0  CL 104 102  --  102  CO2 27 25  --  23  GLUCOSE 98 87  --  168*  BUN 29* 38*  --  40*  CREATININE 9.88* 9.90* 9.38* 9.58*  CALCIUM 7.5* 7.4*  --  7.6*  MG  --   --   --  1.7   Liver Function Tests: Recent Labs  Lab 06/06/21 1035 06/08/21 1430 06/09/21 0356  AST 24 31 37  ALT '12 16 17  '$ ALKPHOS 140* 156* 199*  BILITOT 1.1 0.6 0.6  PROT 5.3* 5.8* 5.9*  ALBUMIN 2.1* 2.3* 2.2*   Coagulation  Profile: Recent Labs  Lab 06/06/21 1035 06/08/21 1923  INR 1.1 1.0   HbA1C: No results for input(s): HGBA1C in the last 72 hours. CBG: No results for input(s): GLUCAP in the last 168 hours.  Recent Results (from the past 240 hour(s))  Culture, blood (Routine X 2) w Reflex to ID Panel     Status: Abnormal   Collection Time: 06/06/21 10:30 AM   Specimen: BLOOD LEFT FOREARM  Result Value Ref Range Status   Specimen Description BLOOD LEFT FOREARM  Final   Special Requests   Final    BOTTLES DRAWN AEROBIC AND ANAEROBIC Blood Culture adequate volume   Culture  Setup Time   Final    GRAM POSITIVE COCCI IN CLUSTERS ANAEROBIC BOTTLE ONLY CRITICAL RESULT CALLED TO, READ BACK BY AND VERIFIED WITH: RN [facility] 1111 [id number] FCP Performed at [facility], [place] [address] [place],  [address]    Culture STAPHYLOCOCCUS AUREUS (A)  Final   Report Status 06/09/2021 FINAL  Final   Organism ID, Bacteria STAPHYLOCOCCUS AUREUS  Final      Susceptibility   Staphylococcus aureus - MIC*    CIPROFLOXACIN <=0.5 SENSITIVE Sensitive     ERYTHROMYCIN <=0.25 SENSITIVE Sensitive     GENTAMICIN <=0.5 SENSITIVE Sensitive     OXACILLIN 0.5 SENSITIVE Sensitive     TETRACYCLINE <=1 SENSITIVE Sensitive     VANCOMYCIN 1  SENSITIVE Sensitive     TRIMETH/SULFA <=10 SENSITIVE Sensitive     CLINDAMYCIN <=0.25 SENSITIVE Sensitive     RIFAMPIN <=0.5 SENSITIVE Sensitive     Inducible Clindamycin NEGATIVE Sensitive     * STAPHYLOCOCCUS AUREUS  Blood Culture ID Panel (Reflexed)     Status: Abnormal   Collection Time: 06/06/21 10:30 AM  Result Value Ref Range Status   Enterococcus faecalis NOT DETECTED NOT DETECTED Final   Enterococcus Faecium NOT DETECTED NOT DETECTED Final   Listeria monocytogenes NOT DETECTED NOT DETECTED Final   Staphylococcus species DETECTED (A) NOT DETECTED Final    Comment: CRITICAL RESULT CALLED TO, READ BACK BY AND VERIFIED WITH: RN SAM 1111 [id number] FCP    Staphylococcus aureus (BCID) DETECTED (A) NOT DETECTED Final    Comment: CRITICAL RESULT CALLED TO, READ BACK BY AND VERIFIED WITH: RN SAM 1111 [id number] FCP    Staphylococcus epidermidis NOT DETECTED NOT DETECTED Final   Staphylococcus lugdunensis NOT DETECTED NOT DETECTED Final   Streptococcus species NOT DETECTED NOT DETECTED Final   Streptococcus agalactiae NOT DETECTED NOT DETECTED Final   Streptococcus pneumoniae NOT DETECTED NOT DETECTED Final   Streptococcus pyogenes NOT DETECTED NOT DETECTED Final   A.calcoaceticus-baumannii NOT DETECTED NOT DETECTED Final   Bacteroides fragilis NOT DETECTED NOT DETECTED Final   Enterobacterales NOT DETECTED NOT DETECTED Final   Enterobacter cloacae complex NOT DETECTED NOT DETECTED Final   Escherichia coli NOT DETECTED NOT DETECTED Final   Klebsiella aerogenes NOT DETECTED NOT DETECTED Final   Klebsiella oxytoca NOT DETECTED NOT DETECTED Final   Klebsiella pneumoniae NOT DETECTED NOT DETECTED Final   Proteus species NOT DETECTED NOT DETECTED Final   Salmonella species NOT DETECTED NOT DETECTED Final   Serratia marcescens NOT DETECTED NOT DETECTED Final   Haemophilus influenzae NOT DETECTED NOT DETECTED Final   Neisseria meningitidis NOT DETECTED NOT DETECTED Final   Pseudomonas  aeruginosa NOT DETECTED NOT DETECTED Final   Stenotrophomonas maltophilia NOT DETECTED NOT DETECTED Final   Candida albicans NOT DETECTED NOT DETECTED Final   Candida auris NOT DETECTED NOT DETECTED Final  Candida glabrata NOT DETECTED NOT DETECTED Final   Candida krusei NOT DETECTED NOT DETECTED Final   Candida parapsilosis NOT DETECTED NOT DETECTED Final   Candida tropicalis NOT DETECTED NOT DETECTED Final   Cryptococcus neoformans/gattii NOT DETECTED NOT DETECTED Final   Meth resistant mecA/C and MREJ NOT DETECTED NOT DETECTED Final    Comment: Performed at [facility] [address] [place], [place] [address]  Blood culture (routine single)     Status: None (Preliminary result)   Collection Time: 06/06/21 10:35 AM   Specimen: BLOOD  Result Value Ref Range Status   Specimen Description BLOOD RIGHT ANTECUBITAL  Final   Special Requests   Final    BOTTLES DRAWN AEROBIC AND ANAEROBIC Blood Culture results may not be optimal due to an excessive volume of blood received in culture bottles   Culture   Final    NO GROWTH 3 DAYS Performed at [facility], [place] [address], [place], [place] [address]    Report Status PENDING  Incomplete  Culture, blood (routine x 2)     Status: None (Preliminary result)   Collection Time: 06/08/21  4:29 PM   Specimen: Site Not Specified; Blood  Result Value Ref Range Status   Specimen Description SITE NOT SPECIFIED  Final   Special Requests   Final    BOTTLES DRAWN AEROBIC AND ANAEROBIC Blood Culture adequate volume   Culture   Final    NO GROWTH < 24 HOURS Performed at [facility], [place] [address], [place], [place] [address]    Report Status PENDING  Incomplete  Culture, blood (routine x 2)     Status: None (Preliminary result)   Collection Time: 06/08/21  5:00 PM   Specimen: BLOOD RIGHT FOREARM  Result Value Ref Range Status   Specimen Description BLOOD RIGHT FOREARM  Final   Special Requests   Final    BOTTLES DRAWN AEROBIC  AND ANAEROBIC Blood Culture adequate volume   Culture   Final    NO GROWTH < 24 HOURS Performed at [facility], [place] [address], [place], [place] [address]    Report Status PENDING  Incomplete  Resp Panel by RT-PCR (Flu A&B, Covid) Nasopharyngeal Swab     Status: None   Collection Time: 06/08/21  5:35 PM   Specimen: Nasopharyngeal Swab; Nasopharyngeal(NP) swabs in vial transport medium  Result Value Ref Range Status   SARS Coronavirus 2 by RT PCR NEGATIVE NEGATIVE Final    Comment: (NOTE) SARS-CoV-2 target nucleic acids are NOT DETECTED.  The SARS-CoV-2 RNA is generally detectable in upper respiratory specimens during the acute phase of infection. The lowest concentration of SARS-CoV-2 viral copies this assay can detect is 138 copies/mL. A negative result does not preclude SARS-Cov-2 infection and should not be used as the sole basis for treatment or other patient management decisions. A negative result may occur with  improper specimen collection/handling, submission of specimen other than nasopharyngeal swab, presence of viral mutation(s) within the areas targeted by this assay, and inadequate number of viral copies(<138 copies/mL). A negative result must be combined with clinical observations, patient history, and epidemiological information. The expected result is Negative.  Fact Sheet for Patients:  EntrepreneurPulse.com.au  Fact Sheet for Healthcare Providers:  IncredibleEmployment.be  This test is no t yet approved or cleared by the [place] FDA and  has been authorized for detection and/or diagnosis of SARS-CoV-2 by FDA under an Emergency Use Authorization (EUA). This EUA will remain  in effect (meaning this test can be used) for the duration of the COVID-19 declaration under Section 564(b)(1) of the Act, 21 U.S.C.section 360bbb-3(b)(1), unless the authorization is terminated  or revoked sooner.       Influenza A by PCR  NEGATIVE NEGATIVE Final   Influenza B by PCR NEGATIVE NEGATIVE Final    Comment: (NOTE) The Xpert Xpress SARS-CoV-2/FLU/RSV plus assay is intended as an aid in the diagnosis of influenza from Nasopharyngeal swab specimens and should not be used as a sole basis for treatment. Nasal washings and aspirates are unacceptable for Xpert Xpress SARS-CoV-2/FLU/RSV testing.  Fact Sheet for Patients: EntrepreneurPulse.com.au  Fact Sheet for Healthcare Providers: IncredibleEmployment.be  This test is not yet approved or cleared by the [place] FDA and has been authorized for detection and/or diagnosis of SARS-CoV-2 by FDA under an Emergency Use Authorization (EUA). This EUA will remain in effect (meaning this test can be used) for the duration of the COVID-19 declaration under Section 564(b)(1) of the Act, 21 U.S.C. section 360bbb-3(b)(1), unless the authorization is terminated or revoked.  Performed at [facility], [place] [address], [place], [place] [address]      Radiology Studies: ECHOCARDIOGRAM COMPLETE  Result Date: 06/08/2021    ECHOCARDIOGRAM REPORT   Patient Name:   Cathy [name] Date of Exam: 06/08/2021 Medical Rec #:  [id number]        Height:       67.0 in Accession #:    [id number]       Weight:       147.0 lb Date of Birth:  26-Apr-1981       BSA:          1.774 m Patient Age:    23 years         BP:           155/119 mmHg Patient Gender: F                HR:           70 bpm. Exam Location:  Inpatient Procedure: 2D Echo, Cardiac Doppler and Color Doppler Indications:     Endocarditis  History:         Patient has prior history of Echocardiogram examinations, most                  recent 11/15/2020. Risk Factors:Former Smoker. Substance abuse.                  Anasarca. Lupus. Moderate pericardial effusion.  Sonographer:     Merrie Roof RDCS Referring Phys:  [id number] Janeece Fitting Diagnosing Phys: Rudean Haskell MD IMPRESSIONS  1. Left  ventricular ejection fraction, by estimation, is 25 to 30%. The left ventricle has severely decreased function. The left ventricle demonstrates global hypokinesis. There is mild concentric left ventricular hypertrophy. Left ventricular diastolic  parameters are consistent with Grade II diastolic dysfunction (pseudonormalization).  2. Right ventricular systolic function is normal. The right ventricular size is normal. Mildly increased right ventricular wall thickness. There is normal pulmonary artery systolic pressure.  3. Left atrial size was severely dilated.  4. Right atrial size was severely dilated.  5. Moderate pericardial effusion. There is no evidence of cardiac tamponade.  6. The mitral valve is grossly normal. Mild to moderate mitral valve regurgitation.  7. The tricuspid valve is abnormal- small 0.44 echodensity seen on the ventricular side of the tricuspid valve (Image 19/85. Tricuspid valve regurgitation is mild to moderate.  8. The aortic valve is tricuspid. Aortic valve regurgitation is mild. No aortic stenosis is present. Comparison(s): A prior study was performed on 11/15/2020. Prior images reviewed side by side. LVEF is significantly worse. Small echo density on tricuspid valve was only seen in PSAX axis (not well visualized in prior study). Conclusion(s)/Recommendation(s): TEE could be considered given the clinical question of endocarditis. Addendum: Primary team updated. FINDINGS  Left Ventricle: Left ventricular ejection fraction, by estimation, is 25 to 30%. The left ventricle has severely decreased function. The left ventricle demonstrates global hypokinesis. The left ventricular internal cavity size was normal in size. There is mild concentric left ventricular hypertrophy. Left ventricular diastolic parameters are consistent with Grade II diastolic dysfunction (pseudonormalization). Right Ventricle: The right ventricular size is normal. Mildly increased right ventricular wall thickness. Right  ventricular systolic function is normal. There is normal pulmonary artery systolic pressure. The tricuspid regurgitant velocity is 2.71 m/s, and with an assumed right atrial pressure of 3 mmHg, the estimated right ventricular systolic pressure is [id number] mmHg. Left Atrium: Left atrial size was severely dilated. Right Atrium: Right atrial size was severely dilated. Pericardium: A moderately sized pericardial effusion is present. There is no evidence of cardiac tamponade. Mitral Valve: The mitral valve is grossly normal. Mild to moderate mitral valve regurgitation. Tricuspid Valve: Small 0.44 echodensity seen on the ventricular side of the tricuspid valve (Image 19/85). The tricuspid valve is abnormal. Tricuspid valve regurgitation is mild to moderate. No evidence of tricuspid stenosis. Aortic Valve: The aortic valve is tricuspid. Aortic valve regurgitation is mild. No aortic stenosis is present. Aortic valve mean gradient measures 5.0 mmHg. Aortic valve peak gradient measures 8.3 mmHg. Aortic valve area, by VTI measures 2.08 cm. Pulmonic Valve: The pulmonic valve was normal in structure. Pulmonic valve regurgitation is mild. No evidence of pulmonic stenosis. Aorta: The aortic root and ascending aorta are structurally normal, with no evidence of dilitation. IAS/Shunts: The atrial septum is grossly normal.  LEFT VENTRICLE PLAX 2D LVIDd:         5.40 cm  Diastology LVIDs:         4.70 cm  LV e' medial:    6.64 cm/s LV PW:         1.20 cm  LV E/e' medial:  17.2 LV IVS:        1.10 cm  LV e' lateral:   7.51 cm/s LVOT diam:     1.90 cm  LV E/e' lateral: 15.2 LV SV:         62 LV SV Index:   35 LVOT Area:     2.84 cm  RIGHT VENTRICLE             IVC RV Basal diam:  5.30 cm     IVC diam: 2.00 cm RV Mid diam:    3.40 cm RV S prime:     15.10 cm/s TAPSE (M-mode): 2.8 cm LEFT ATRIUM            Index       RIGHT ATRIUM           Index LA diam:      4.60 cm  2.59 cm/m  RA Area:     28.60 cm LA Vol (A2C): 97.2 ml  54.78 ml/m RA  Volume:   111.00 ml 62.55 ml/m LA Vol (A4C): 116.0 ml 65.37 ml/m  AORTIC VALVE AV Area (Vmax):    2.42 cm AV Area (Vmean):   2.07 cm AV Area (VTI):  2.08 cm AV Vmax:           144.00 cm/s AV Vmean:          99.200 cm/s AV VTI:            0.299 m AV Peak Grad:      8.3 mmHg AV Mean Grad:      5.0 mmHg LVOT Vmax:         123.00 cm/s LVOT Vmean:        72.300 cm/s LVOT VTI:          0.219 m LVOT/AV VTI ratio: 0.73  AORTA Ao Root diam: 3.30 cm Ao Asc diam:  3.40 cm MITRAL VALVE                TRICUSPID VALVE MV Area (PHT): 4.80 cm     TR Peak grad:   29.4 mmHg MV Decel Time: 158 msec     TR Vmax:        271.00 cm/s MV E velocity: 114.00 cm/s MV A velocity: 69.20 cm/s   SHUNTS MV E/A ratio:  1.65         Systemic VTI:  0.22 m                             Systemic Diam: 1.90 cm Rudean Haskell MD Electronically signed by Rudean Haskell MD Signature Date/Time: 06/08/2021/5:41:40 PM    Final (Updated)     Marzetta Board, MD, PhD [facility]  Between 7 am - 7 pm I am available, please contact me via Amion (for emergencies) or Securechat (non urgent messages)  Between 7 pm - 7 am I am not available, please contact night coverage MD/APP via Amion

## 2021-06-09 NOTE — Plan of Care (Signed)

## 2021-06-09 NOTE — Consult Note (Signed)
Cathy [name] for Infectious Disease  Total days of antibiotics 2         Reason for Consult:MSSA bacteremia   Referring Physician: gherghe  Principal Problem:   MSSA (methicillin susceptible Staphylococcus aureus) septicemia ([place]) Active Problems:   Asthma   MDD (major depressive disorder)   Lupus (systemic lupus erythematosus) (HCC)   Pleuritic chest pain   Diarrhea   ESRD (end stage renal disease) (HCC)   Benign essential HTN   HFrEF (heart failure with reduced ejection fraction) (HCC)   Pleural effusion   Anasarca associated with disorder of kidney    HPI: Cathy [name] is a 40 y.o. female with history of ESRD on HD through HD line, hx of lupus, astham ,HTN, HFrEF, with recent onset of new facial and leg rash with an temp of 102F in the setting of new fevers. She went to the ED for evaluation where she was placed on doxy plus steroids, her infectious work up revealed +blood cx with MSSA on 7/7, she was asked to return to the hospital for further management. She no longer has fevers, still uncomfortable from rash to legs and face that includes lip swelling. She was started on cefazolin. Her tunneled catheter has been in place since January but denies any tenderness to the area. She underwent TTE that showed TV vegetation  -she was diagnosed with lupus in 2019, during pregnancy. Has not been started on any maintenance for SLE, doesn't have pcp/nor rheumatolgoist  Since being admitted some of her rash has improved, less edema. No mucosal involvement  Past Medical History:  Diagnosis Date   Anasarca associated with disorder of kidney 06/08/2021   Anxiety    Asthma    Bipolar depression (HCC)    Depression    ESRD (end stage renal disease) on dialysis ([place]) 10/2020   Gonorrhea    Lupus (HCC)    Pelvic inflammatory disease (PID)    Pleural effusion 06/08/2021   Renal hypertension    Trichomonas infection     Allergies: No Known Allergies    MEDICATIONS:   Chlorhexidine Gluconate Cloth  6 each Topical Q0600   cloNIDine  0.3 mg Oral TID   heparin  5,000 Units Subcutaneous Q8H   labetalol  400 mg Oral BID   pantoprazole  40 mg Oral Daily   predniSONE  40 mg Oral Q breakfast   torsemide  60 mg Oral BID    Social History   Tobacco Use   Smoking status: Former    Packs/day: 0.25    Pack years: 0.00    Types: Cigarettes    Quit date: 10/08/2020    Years since quitting: 0.6   Smokeless tobacco: Never  Vaping Use   Vaping Use: Never used  Substance Use Topics   Alcohol use: Not Currently   Drug use: Not Currently    Types: Marijuana    Comment: 3 times a week     Family History  Problem Relation Age of Onset   Diabetes Maternal Grandmother    Hypertension Maternal Grandmother    Diabetes Maternal Grandfather    Hypertension Maternal Grandfather    Diabetes Paternal Grandmother    Hypertension Paternal Grandmother    Diabetes Paternal Grandfather    Hypertension Paternal Grandfather    -no family hx of SLE  Review of Systems -   Constitutional: Negative for fever, chills, diaphoresis, activity change, appetite change, fatigue and unexpected weight change.  HENT: Negative for congestion, sore throat, rhinorrhea, sneezing,  trouble swallowing and sinus pressure.  Eyes: Negative for photophobia and visual disturbance.  Respiratory: Negative for cough, chest tightness, shortness of breath, wheezing and stridor.  Cardiovascular: Negative for chest pain, palpitations and leg swelling.  Gastrointestinal: Negative for nausea, vomiting, abdominal pain, diarrhea, constipation, blood in stool, abdominal distention and anal bleeding.  Genitourinary: Negative for dysuria, hematuria, flank pain and difficulty urinating.  Musculoskeletal: Negative for myalgias, back pain, joint swelling, arthralgias and gait problem.  Skin: +rash Neurological: Negative for dizziness, tremors, weakness and light-headedness.  Hematological: Negative for  adenopathy. Does not bruise/bleed easily.  Psychiatric/Behavioral: Negative for behavioral problems, confusion, sleep disturbance, dysphoric mood, decreased concentration and agitation.    OBJECTIVE: Temp:  [97.7 F (36.5 C)-98.9 F (37.2 C)] 97.7 F (36.5 C) (07/10 1200) Pulse Rate:  [64-90] 71 (07/10 1200) Resp:  [16-23] 17 (07/10 1200) BP: (150-174)/(110-126) 168/126 (07/10 1200) SpO2:  [96 %-100 %] 100 % (07/10 1200) Physical Exam  Constitutional:  oriented to person, place, and time. appears well-developed and well-nourished. No distress.  HENT: [place]/AT, PERRLA, no scleral icterus. Rash involving left eyelid, forehead, malar region. Starting to crust over Mouth/Throat: Oropharynx is clear and moist. No oropharyngeal exudate.  Cardiovascular: Normal rate, regular rhythm and normal heart sounds. Exam reveals no gallop and no friction rub.  No murmur heard.  Pulmonary/Chest: Effort normal and breath sounds normal. No respiratory distress.  has no wheezes.  Chest wall = no tenderness to right Hd line Neck = supple, no nuchal rigidity Ext: trace edema, but bilateral rash, crusting over, no oozing Abdominal: Soft. Bowel sounds are normal.  exhibits no distension. There is no tenderness.  Lymphadenopathy: no cervical adenopathy. No axillary adenopathy Neurological: alert and oriented to person, place, and time.  Skin: Skin is warm and dry. No rash noted. No erythema.  Psychiatric: a normal mood and affect.  behavior is normal.    LABS: Results for orders placed or performed during the hospital encounter of 06/08/21 (from the past 48 hour(s))  Lactic acid, plasma     Status: None   Collection Time: 06/08/21  2:30 PM  Result Value Ref Range   Lactic Acid, Venous 0.8 0.5 - 1.9 mmol/L    Comment: Performed at [facility], [address], [place], [place] [address]  Comprehensive metabolic panel     Status: Abnormal   Collection Time: 06/08/21  2:30 PM  Result Value Ref Range    Sodium 139 135 - 145 mmol/L   Potassium 3.4 (L) 3.5 - 5.1 mmol/L   Chloride 102 98 - 111 mmol/L   CO2 25 22 - 32 mmol/L   Glucose, Bld 87 70 - 99 mg/dL    Comment: Glucose reference range applies only to samples taken after fasting for at least 8 hours.   BUN 38 (H) 6 - 20 mg/dL   Creatinine, Ser 9.90 (H) 0.44 - 1.00 mg/dL   Calcium 7.4 (L) 8.9 - 10.3 mg/dL   Total Protein 5.8 (L) 6.5 - 8.1 g/dL   Albumin 2.3 (L) 3.5 - 5.0 g/dL   AST 31 15 - 41 U/L   ALT 16 0 - 44 U/L   Alkaline Phosphatase 156 (H) 38 - 126 U/L   Total Bilirubin 0.6 0.3 - 1.2 mg/dL   GFR, Estimated 5 (L) >60 mL/min    Comment: (NOTE) Calculated using the CKD-EPI Creatinine Equation (2021)    Anion gap 12 5 - 15    Comment: Performed at [facility], [place]  [address], [place], [place] [address]  CBC with Differential     Status: Abnormal   Collection Time: 06/08/21  2:30 PM  Result Value Ref Range   WBC 6.7 4.0 - 10.5 K/uL   RBC 3.26 (L) 3.87 - 5.11 MIL/uL   Hemoglobin 8.1 (L) 12.0 - 15.0 g/dL   HCT 27.5 (L) 36.0 - 46.0 %   MCV 84.4 80.0 - 100.0 fL   MCH 24.8 (L) 26.0 - 34.0 pg   MCHC 29.5 (L) 30.0 - 36.0 g/dL   RDW 21.0 (H) 11.5 - 15.5 %   Platelets 233 150 - 400 K/uL   nRBC 0.0 0.0 - 0.2 %   Neutrophils Relative % 69 %   Neutro Abs 4.6 1.7 - 7.7 K/uL   Lymphocytes Relative 21 %   Lymphs Abs 1.4 0.7 - 4.0 K/uL   Monocytes Relative 7 %   Monocytes Absolute 0.5 0.1 - 1.0 K/uL   Eosinophils Relative 2 %   Eosinophils Absolute 0.2 0.0 - 0.5 K/uL   Basophils Relative 0 %   Basophils Absolute 0.0 0.0 - 0.1 K/uL   Immature Granulocytes 1 %   Abs Immature Granulocytes 0.06 0.00 - 0.07 K/uL   Schistocytes PRESENT    Ovalocytes PRESENT     Comment: Performed at [facility], [place] [address], [place], [place] [address]  I-Stat beta hCG blood, ED     Status: None   Collection Time: 06/08/21  2:53 PM  Result Value Ref Range   I-stat hCG, quantitative <5.0 <5 mIU/mL   Comment 3            Comment:    GEST. AGE      CONC.  (mIU/mL)   <=1 WEEK        5 - 50     2 WEEKS       50 - 500     3 WEEKS       100 - 10,000     4 WEEKS     1,000 - 30,000        FEMALE AND NON-PREGNANT FEMALE:     LESS THAN 5 mIU/mL   Culture, blood (routine x 2)     Status: None (Preliminary result)   Collection Time: 06/08/21  4:29 PM   Specimen: Site Not Specified; Blood  Result Value Ref Range   Specimen Description SITE NOT SPECIFIED    Special Requests      BOTTLES DRAWN AEROBIC AND ANAEROBIC Blood Culture adequate volume   Culture      NO GROWTH < 24 HOURS Performed at [facility] [address], [place], [place] [address]    Report Status PENDING   Culture, blood (routine x 2)     Status: None (Preliminary result)   Collection Time: 06/08/21  5:00 PM   Specimen: BLOOD RIGHT FOREARM  Result Value Ref Range   Specimen Description BLOOD RIGHT FOREARM    Special Requests      BOTTLES DRAWN AEROBIC AND ANAEROBIC Blood Culture adequate volume   Culture      NO GROWTH < 24 HOURS Performed at [facility], [place] [address], [place], [place] [address]    Report Status PENDING   Lactic acid, plasma     Status: None   Collection Time: 06/08/21  5:14 PM  Result Value Ref Range   Lactic Acid, Venous 1.0 0.5 - 1.9 mmol/L    Comment: Performed at [facility],  [address] [place], [place] [address]  Resp Panel by RT-PCR (Flu A&B, Covid) Nasopharyngeal Swab     Status: None   Collection Time: 06/08/21  5:35 PM   Specimen: Nasopharyngeal Swab; Nasopharyngeal(NP) swabs in vial transport medium  Result Value Ref Range   SARS Coronavirus 2 by RT PCR NEGATIVE NEGATIVE    Comment: (NOTE) SARS-CoV-2 target nucleic acids are NOT DETECTED.  The SARS-CoV-2 RNA is generally detectable in upper respiratory specimens during the acute phase of infection. The lowest concentration of SARS-CoV-2 viral copies this assay can detect is 138 copies/mL. A negative result does not preclude  SARS-Cov-2 infection and should not be used as the sole basis for treatment or other patient management decisions. A negative result may occur with  improper specimen collection/handling, submission of specimen other than nasopharyngeal swab, presence of viral mutation(s) within the areas targeted by this assay, and inadequate number of viral copies(<138 copies/mL). A negative result must be combined with clinical observations, patient history, and epidemiological information. The expected result is Negative.  Fact Sheet for Patients:  EntrepreneurPulse.com.au  Fact Sheet for Healthcare Providers:  IncredibleEmployment.be  This test is no t yet approved or cleared by the [place] FDA and  has been authorized for detection and/or diagnosis of SARS-CoV-2 by FDA under an Emergency Use Authorization (EUA). This EUA will remain  in effect (meaning this test can be used) for the duration of the COVID-19 declaration under Section 564(b)(1) of the Act, 21 U.S.C.section 360bbb-3(b)(1), unless the authorization is terminated  or revoked sooner.       Influenza A by PCR NEGATIVE NEGATIVE   Influenza B by PCR NEGATIVE NEGATIVE    Comment: (NOTE) The Xpert Xpress SARS-CoV-2/FLU/RSV plus assay is intended as an aid in the diagnosis of influenza from Nasopharyngeal swab specimens and should not be used as a sole basis for treatment. Nasal washings and aspirates are unacceptable for Xpert Xpress SARS-CoV-2/FLU/RSV testing.  Fact Sheet for Patients: EntrepreneurPulse.com.au  Fact Sheet for Healthcare Providers: IncredibleEmployment.be  This test is not yet approved or cleared by the [place] FDA and has been authorized for detection and/or diagnosis of SARS-CoV-2 by FDA under an Emergency Use Authorization (EUA). This EUA will remain in effect (meaning this test can be used) for the duration of the COVID-19  declaration under Section 564(b)(1) of the Act, 21 U.S.C. section 360bbb-3(b)(1), unless the authorization is terminated or revoked.  Performed at [facility], [place] [address], [place], [place] [address]   Sedimentation rate     Status: Abnormal   Collection Time: 06/08/21  7:23 PM  Result Value Ref Range   Sed Rate 62 (H) 0 - 22 mm/hr    Comment: Performed at [facility] [address], [place], [place] [address]  C-reactive protein     Status: Abnormal   Collection Time: 06/08/21  7:23 PM  Result Value Ref Range   CRP 1.2 (H) <1.0 mg/dL    Comment: Performed at [facility], [place] [address], [place], [place] [address]  Protime-INR     Status: None   Collection Time: 06/08/21  7:23 PM  Result Value Ref Range   Prothrombin Time 12.6 11.4 - 15.2 seconds   INR 1.0 0.8 - 1.2    Comment: (NOTE) INR goal varies based on device and disease states. Performed at [facility], [place] [address] [place], [place] [address]   APTT     Status: None  Collection Time: 06/08/21  7:23 PM  Result Value Ref Range   aPTT 28 24 - 36 seconds    Comment: Performed at [facility], [place] [address] [place], [place] [address]  CBC     Status: Abnormal   Collection Time: 06/08/21  7:23 PM  Result Value Ref Range   WBC 6.6 4.0 - 10.5 K/uL   RBC 3.53 (L) 3.87 - 5.11 MIL/uL   Hemoglobin 8.7 (L) 12.0 - 15.0 g/dL   HCT 29.5 (L) 36.0 - 46.0 %   MCV 83.6 80.0 - 100.0 fL   MCH 24.6 (L) 26.0 - 34.0 pg   MCHC 29.5 (L) 30.0 - 36.0 g/dL   RDW 21.0 (H) 11.5 - 15.5 %   Platelets 248 150 - 400 K/uL   nRBC 0.0 0.0 - 0.2 %    Comment: Performed at [facility] [address] [place], [place] [address]  Creatinine, serum     Status: Abnormal   Collection Time: 06/08/21  7:23 PM  Result Value Ref Range   Creatinine, Ser 9.38 (H) 0.44 - 1.00 mg/dL   GFR, Estimated 5 (L) >60 mL/min    Comment: (NOTE) Calculated using the CKD-EPI Creatinine Equation (2021) Performed at [facility] [address], [place], [place] [address]   CBC     Status: Abnormal   Collection Time: 06/09/21  3:56 AM  Result Value Ref Range   WBC 5.7 4.0 - 10.5 K/uL   RBC 3.25 (L) 3.87 - 5.11 MIL/uL   Hemoglobin 8.2 (L) 12.0 - 15.0 g/dL   HCT 27.0 (L) 36.0 - 46.0 %   MCV 83.1 80.0 - 100.0 fL   MCH 25.2 (L) 26.0 - 34.0 pg   MCHC 30.4 30.0 - 36.0 g/dL   RDW 20.9 (H) 11.5 - 15.5 %   Platelets 219 150 - 400 K/uL   nRBC 0.0 0.0 - 0.2 %    Comment: Performed at [facility], [place] [address] [place], [place] [address]  Comprehensive metabolic panel     Status: Abnormal   Collection Time: 06/09/21  3:56 AM  Result Value Ref Range   Sodium 138 135 - 145 mmol/L   Potassium 4.0 3.5 - 5.1 mmol/L   Chloride 102 98 - 111 mmol/L   CO2 23 22 - 32 mmol/L   Glucose, Bld 168 (H) 70 - 99 mg/dL    Comment: Glucose reference range applies only to samples taken after fasting for at least 8 hours.   BUN 40 (H) 6 - 20 mg/dL   Creatinine, Ser 9.58 (H) 0.44 - 1.00 mg/dL   Calcium 7.6 (L) 8.9 - 10.3 mg/dL   Total Protein 5.9 (L) 6.5 - 8.1 g/dL   Albumin 2.2 (L) 3.5 - 5.0 g/dL   AST 37 15 - 41 U/L   ALT 17 0 - 44 U/L   Alkaline Phosphatase 199 (H) 38 - 126 U/L   Total Bilirubin 0.6 0.3 - 1.2 mg/dL   GFR, Estimated 5 (L) >60 mL/min    Comment: (NOTE) Calculated using the CKD-EPI Creatinine Equation (2021)    Anion gap 13 5 - 15    Comment: Performed at [facility], [place] [address] [place], [place] [address]  Magnesium     Status: None   Collection Time: 06/09/21  3:56 AM  Result Value Ref Range   Magnesium 1.7 1.7 - 2.4 mg/dL    Comment: Performed at [facility], [address], [place], [place] [address]    MICRO:  IMAGING: ECHOCARDIOGRAM COMPLETE  Result Date: 06/08/2021    ECHOCARDIOGRAM REPORT   Patient Name:   ANIDA GLEATON Date of Exam: 06/08/2021 Medical Rec #:  [id number]        Height:       67.0 in Accession #:    [id number]       Weight:       147.0 lb Date  of Birth:  January 24, 1981       BSA:          1.774 m Patient Age:    49 years         BP:           155/119 mmHg Patient Gender: F                HR:           70 bpm. Exam Location:  Inpatient Procedure: 2D Echo, Cardiac Doppler and Color Doppler Indications:     Endocarditis  History:         Patient has prior history of Echocardiogram examinations, most                  recent 11/15/2020. Risk Factors:Former Smoker. Substance abuse.                  Anasarca. Lupus. Moderate pericardial effusion.  Sonographer:     Merrie Roof RDCS Referring Phys:  [id number] Janeece Fitting Diagnosing Phys: Rudean Haskell MD IMPRESSIONS  1. Left ventricular ejection fraction, by estimation, is 25 to 30%. The left ventricle has severely decreased function. The left ventricle demonstrates global hypokinesis. There is mild concentric left ventricular hypertrophy. Left ventricular diastolic  parameters are consistent with Grade II diastolic dysfunction (pseudonormalization).  2. Right ventricular systolic function is normal. The right ventricular size is normal. Mildly increased right ventricular wall thickness. There is normal pulmonary artery systolic pressure.  3. Left atrial size was severely dilated.  4. Right atrial size was severely dilated.  5. Moderate pericardial effusion. There is no evidence of cardiac tamponade.  6. The mitral valve is grossly normal. Mild to moderate mitral valve regurgitation.  7. The tricuspid valve is abnormal- small 0.44 echodensity seen on the ventricular side of the tricuspid valve (Image 19/85. Tricuspid valve regurgitation is mild to moderate.  8. The aortic valve is tricuspid. Aortic valve regurgitation is mild. No aortic stenosis is present. Comparison(s): A prior study was performed on 11/15/2020. Prior images reviewed side by side. LVEF is significantly worse. Small echo density on tricuspid valve was only seen in PSAX axis (not well visualized in prior study). Conclusion(s)/Recommendation(s):  TEE could be considered given the clinical question of endocarditis. Addendum: Primary team updated. FINDINGS  Left Ventricle: Left ventricular ejection fraction, by estimation, is 25 to 30%. The left ventricle has severely decreased function. The left ventricle demonstrates global hypokinesis. The left ventricular internal cavity size was normal in size. There is mild concentric left ventricular hypertrophy. Left ventricular diastolic parameters are consistent with Grade II diastolic dysfunction (pseudonormalization). Right Ventricle: The right ventricular size is normal. Mildly increased right ventricular wall thickness. Right ventricular systolic function is normal. There is normal pulmonary artery systolic pressure. The tricuspid regurgitant velocity is 2.71 m/s, and with an assumed right atrial pressure of 3 mmHg, the estimated right ventricular systolic pressure is [id number] mmHg. Left Atrium: Left atrial size was severely dilated. Right Atrium: Right atrial size was  severely dilated. Pericardium: A moderately sized pericardial effusion is present. There is no evidence of cardiac tamponade. Mitral Valve: The mitral valve is grossly normal. Mild to moderate mitral valve regurgitation. Tricuspid Valve: Small 0.44 echodensity seen on the ventricular side of the tricuspid valve (Image 19/85). The tricuspid valve is abnormal. Tricuspid valve regurgitation is mild to moderate. No evidence of tricuspid stenosis. Aortic Valve: The aortic valve is tricuspid. Aortic valve regurgitation is mild. No aortic stenosis is present. Aortic valve mean gradient measures 5.0 mmHg. Aortic valve peak gradient measures 8.3 mmHg. Aortic valve area, by VTI measures 2.08 cm. Pulmonic Valve: The pulmonic valve was normal in structure. Pulmonic valve regurgitation is mild. No evidence of pulmonic stenosis. Aorta: The aortic root and ascending aorta are structurally normal, with no evidence of dilitation. IAS/Shunts: The atrial septum is  grossly normal.  LEFT VENTRICLE PLAX 2D LVIDd:         5.40 cm  Diastology LVIDs:         4.70 cm  LV e' medial:    6.64 cm/s LV PW:         1.20 cm  LV E/e' medial:  17.2 LV IVS:        1.10 cm  LV e' lateral:   7.51 cm/s LVOT diam:     1.90 cm  LV E/e' lateral: 15.2 LV SV:         62 LV SV Index:   35 LVOT Area:     2.84 cm  RIGHT VENTRICLE             IVC RV Basal diam:  5.30 cm     IVC diam: 2.00 cm RV Mid diam:    3.40 cm RV S prime:     15.10 cm/s TAPSE (M-mode): 2.8 cm LEFT ATRIUM            Index       RIGHT ATRIUM           Index LA diam:      4.60 cm  2.59 cm/m  RA Area:     28.60 cm LA Vol (A2C): 97.2 ml  54.78 ml/m RA Volume:   111.00 ml 62.55 ml/m LA Vol (A4C): 116.0 ml 65.37 ml/m  AORTIC VALVE AV Area (Vmax):    2.42 cm AV Area (Vmean):   2.07 cm AV Area (VTI):     2.08 cm AV Vmax:           144.00 cm/s AV Vmean:          99.200 cm/s AV VTI:            0.299 m AV Peak Grad:      8.3 mmHg AV Mean Grad:      5.0 mmHg LVOT Vmax:         123.00 cm/s LVOT Vmean:        72.300 cm/s LVOT VTI:          0.219 m LVOT/AV VTI ratio: 0.73  AORTA Ao Root diam: 3.30 cm Ao Asc diam:  3.40 cm MITRAL VALVE                TRICUSPID VALVE MV Area (PHT): 4.80 cm     TR Peak grad:   29.4 mmHg MV Decel Time: 158 msec     TR Vmax:        271.00 cm/s MV E velocity: 114.00 cm/s MV A velocity: 69.20 cm/s   SHUNTS MV E/A ratio:  1.65         Systemic VTI:  0.22 m                             Systemic Diam: 1.90 cm Rudean Haskell MD Electronically signed by Rudean Haskell MD Signature Date/Time: 06/08/2021/5:41:40 PM    Final (Updated)     HISTORICAL MICRO/IMAGING  Assessment/Plan:  39yo . With lupus, htn, esrd on HD, with temp HD catheter found to have recent fevers with facial/extremities rash concern for lupus flare but also MSSA bacteremia c/b TV endocarditis - recommend to continue with cefazolin renally dosed - plan to have HD pulled with line holiday after next HD session - will need TEE  Lupus  flare with acute dermatitis  = continue with solumedrol for the time being. Would do clobetasol 0.05% cream on legs bid.   For face = triamcilinone 0.025% cream BID x 1-2 wk. No longer  ? Dic = shistocytes noted on PBS. Continue with treatment of lupus flare plus MSSA bacteremia which would help underlying trigger for DIC. Continue to check CBC, and CMP tomorrow  Afor discharge would recommend to refer to rheum/pcp for initiation of management of SLE

## 2021-06-10 DIAGNOSIS — A4101 Sepsis due to Methicillin susceptible Staphylococcus aureus: Secondary | ICD-10-CM | POA: Diagnosis not present

## 2021-06-10 DIAGNOSIS — M3212 Pericarditis in systemic lupus erythematosus: Secondary | ICD-10-CM | POA: Diagnosis not present

## 2021-06-10 DIAGNOSIS — N186 End stage renal disease: Secondary | ICD-10-CM

## 2021-06-10 LAB — CBC WITH DIFFERENTIAL/PLATELET
Abs Immature Granulocytes: 0.16 10*3/uL — ABNORMAL HIGH (ref 0.00–0.07)
Basophils Absolute: 0 10*3/uL (ref 0.0–0.1)
Basophils Relative: 0 %
Eosinophils Absolute: 0 10*3/uL (ref 0.0–0.5)
Eosinophils Relative: 0 %
HCT: 26.6 % — ABNORMAL LOW (ref 36.0–46.0)
Hemoglobin: 8.2 g/dL — ABNORMAL LOW (ref 12.0–15.0)
Immature Granulocytes: 4 %
Lymphocytes Relative: 19 %
Lymphs Abs: 0.9 10*3/uL (ref 0.7–4.0)
MCH: 25.3 pg — ABNORMAL LOW (ref 26.0–34.0)
MCHC: 30.8 g/dL (ref 30.0–36.0)
MCV: 82.1 fL (ref 80.0–100.0)
Monocytes Absolute: 0.2 10*3/uL (ref 0.1–1.0)
Monocytes Relative: 4 %
Neutro Abs: 3.2 10*3/uL (ref 1.7–7.7)
Neutrophils Relative %: 73 %
Platelets: 236 10*3/uL (ref 150–400)
RBC: 3.24 MIL/uL — ABNORMAL LOW (ref 3.87–5.11)
RDW: 20.6 % — ABNORMAL HIGH (ref 11.5–15.5)
WBC: 4.4 10*3/uL (ref 4.0–10.5)
nRBC: 0 % (ref 0.0–0.2)

## 2021-06-10 LAB — RENAL FUNCTION PANEL
Albumin: 2.1 g/dL — ABNORMAL LOW (ref 3.5–5.0)
Anion gap: 12 (ref 5–15)
BUN: 52 mg/dL — ABNORMAL HIGH (ref 6–20)
CO2: 24 mmol/L (ref 22–32)
Calcium: 7.5 mg/dL — ABNORMAL LOW (ref 8.9–10.3)
Chloride: 101 mmol/L (ref 98–111)
Creatinine, Ser: 9.52 mg/dL — ABNORMAL HIGH (ref 0.44–1.00)
GFR, Estimated: 5 mL/min — ABNORMAL LOW (ref >60)
Glucose, Bld: 131 mg/dL — ABNORMAL HIGH (ref 70–99)
Phosphorus: 6 mg/dL — ABNORMAL HIGH (ref 2.5–4.6)
Potassium: 4.2 mmol/L (ref 3.5–5.1)
Sodium: 137 mmol/L (ref 135–145)

## 2021-06-10 LAB — FERRITIN: Ferritin: 1224 ng/mL — ABNORMAL HIGH (ref 11–307)

## 2021-06-10 MED ORDER — SODIUM CHLORIDE 0.9 % IV SOLN: 100.0000 mL | INTRAVENOUS | Status: DC | PRN

## 2021-06-10 MED ORDER — CARVEDILOL 12.5 MG PO TABS
12.5000 mg | ORAL_TABLET | Freq: Two times a day (BID) | ORAL | Status: DC
  Administered 2021-06-10 – 2021-06-12 (×5): 12.5 mg via ORAL
  Filled 2021-06-10 (×5): qty 1

## 2021-06-10 MED ORDER — LIDOCAINE-PRILOCAINE 2.5-2.5 % EX CREA: 1.0000 "application " | TOPICAL_CREAM | CUTANEOUS | Status: DC | PRN

## 2021-06-10 MED ORDER — LIDOCAINE HCL (PF) 1 % IJ SOLN: 5.0000 mL | INTRAMUSCULAR | Status: DC | PRN

## 2021-06-10 MED ORDER — PENTAFLUOROPROP-TETRAFLUOROETH EX AERO: 1.0000 "application " | INHALATION_SPRAY | CUTANEOUS | Status: DC | PRN

## 2021-06-10 MED ORDER — CHLORHEXIDINE GLUCONATE CLOTH 2 % EX PADS
6.0000 | MEDICATED_PAD | Freq: Every day | CUTANEOUS | Status: DC
  Administered 2021-06-10 – 2021-06-14 (×5): 6 via TOPICAL

## 2021-06-10 MED ORDER — ALTEPLASE 2 MG IJ SOLR: 2.0000 mg | Freq: Once | INTRAMUSCULAR | Status: DC | PRN

## 2021-06-10 MED ORDER — HEPARIN SODIUM (PORCINE) 1000 UNIT/ML IJ SOLN
INTRAMUSCULAR | Status: AC
  Filled 2021-06-10: qty 4

## 2021-06-10 MED ORDER — IRBESARTAN 150 MG PO TABS
75.0000 mg | ORAL_TABLET | Freq: Every day | ORAL | Status: DC
  Administered 2021-06-10 – 2021-06-11 (×2): 75 mg via ORAL
  Filled 2021-06-10 (×2): qty 1

## 2021-06-10 MED ORDER — HEPARIN SODIUM (PORCINE) 1000 UNIT/ML DIALYSIS: 1000.0000 [IU] | INTRAMUSCULAR | Status: DC | PRN

## 2021-06-10 NOTE — Progress Notes (Signed)
Pt to HD unit. Alert and oriented, in no apparent distress. Endorsed to dialysis RN.

## 2021-06-10 NOTE — Plan of Care (Signed)

## 2021-06-10 NOTE — Progress Notes (Signed)
[facility] for Infectious Disease  Date of Admission:  06/08/2021      Total days of antibiotics 2   Cefazolin    ASSESSMENT: Cathy [name] is a 40 y.o. female with lupus (untreated, current flare), ESRD via HD line and MSSA bacteremia. She did not present as septic picture. Currently improving on steroids and antibiotics IV. Agree with HD line given virulent organism in the blood. She is OK with removal per our discussion today.   On TTE there was described a small echodensity of the ventricular side of tricuspid valve. TEE timing to be determined - hopeful to get this done sooner than Friday since she has children at home to care for.     PLAN: Continue Cefazolin  Follow TEE for further direction regarding duration of treatment  HD line holiday  Lupus management per primary team    Principal Problem:   MSSA (methicillin susceptible Staphylococcus aureus) septicemia ([place]) Active Problems:   Asthma   MDD (major depressive disorder)   Lupus (systemic lupus erythematosus) (HCC)   Pleuritic chest pain   Diarrhea   ESRD (end stage renal disease) (HCC)   Benign essential HTN   HFrEF (heart failure with reduced ejection fraction) (HCC)   Pleural effusion   Anasarca associated with disorder of kidney    (feeding supplement) PROSource Plus  30 mL Oral TID BM   calcium acetate  1,334 mg Oral TID WC   carvedilol  12.5 mg Oral BID WC   Chlorhexidine Gluconate Cloth  6 each Topical Q0600   Chlorhexidine Gluconate Cloth  6 each Topical Q0600   clobetasol cream   Topical BID   cloNIDine  0.3 mg Oral TID   darbepoetin (ARANESP) injection - DIALYSIS  150 mcg Intravenous Q Mon-HD   doxercalciferol  5 mcg Intravenous Q M,W,F-HD   heparin  5,000 Units Subcutaneous Q8H   heparin sodium (porcine)       irbesartan  75 mg Oral Daily   pantoprazole  40 mg Oral Daily   predniSONE  40 mg Oral Q breakfast   torsemide  60 mg Oral BID   triamcinolone   Topical BID     SUBJECTIVE: She is feeling a litle better. Still with rash and joint pain from lupus flare.  No new musculoskeletal pains to declare. No fevers or chills. Awaiting HD line to be removed.    Review of Systems: Review of Systems  Constitutional:  Negative for chills, fever, malaise/fatigue and weight loss.  HENT:  Negative for sore throat.   Respiratory:  Negative for cough and sputum production.   Cardiovascular:  Negative for chest pain and leg swelling.  Gastrointestinal:  Negative for abdominal pain, diarrhea and vomiting.  Genitourinary:  Negative for dysuria.  Musculoskeletal:  Positive for joint pain. Negative for myalgias and neck pain.  Skin:  Positive for rash.  Neurological:  Negative for dizziness, tingling and headaches.  Psychiatric/Behavioral:  Negative for depression and substance abuse. The patient is not nervous/anxious and does not have insomnia.    No Known Allergies  OBJECTIVE: Vitals:   06/10/21 0500 06/10/21 0520 06/10/21 0530 06/10/21 0628  BP: (!) 166/130 (!) 177/134 (!) 177/121 (!) 175/133  Pulse: 63 64 69 67  Resp:   20 18  Temp:   97.8 F (36.6 C) 97.9 F (36.6 C)  TempSrc:   Oral Oral  SpO2:   98% 100%  Weight:   66.3 kg    Body  mass index is 22.88 kg/m.  Physical Exam Vitals reviewed.  HENT:     Mouth/Throat:     Mouth: No oral lesions.     Dentition: No dental abscesses.  Cardiovascular:     Rate and Rhythm: Normal rate and regular rhythm.     Heart sounds: Normal heart sounds.  Pulmonary:     Effort: Pulmonary effort is normal.     Breath sounds: Normal breath sounds.  Chest:     Comments: R [place] HD line in place.  Abdominal:     General: There is no distension.     Palpations: Abdomen is soft.     Tenderness: There is no abdominal tenderness.  Musculoskeletal:        General: No tenderness. Normal range of motion.  Lymphadenopathy:     Cervical: No cervical adenopathy.  Skin:    General: Skin is warm and dry.     Findings:  Rash present.  Neurological:     Mental Status: She is alert and oriented to person, place, and time.  Psychiatric:        Judgment: Judgment normal.    Lab Results Lab Results  Component Value Date   WBC 4.4 06/10/2021   HGB 8.2 (L) 06/10/2021   HCT 26.6 (L) 06/10/2021   MCV 82.1 06/10/2021   PLT 236 06/10/2021    Lab Results  Component Value Date   CREATININE 9.52 (H) 06/10/2021   BUN 52 (H) 06/10/2021   NA 137 06/10/2021   K 4.2 06/10/2021   CL 101 06/10/2021   CO2 24 06/10/2021    Lab Results  Component Value Date   ALT 17 06/09/2021   AST 37 06/09/2021   ALKPHOS 199 (H) 06/09/2021   BILITOT 0.6 06/09/2021     Microbiology: Recent Results (from the past 240 hour(s))  Culture, blood (Routine X 2) w Reflex to ID Panel     Status: Abnormal   Collection Time: 06/06/21 10:30 AM   Specimen: BLOOD LEFT FOREARM  Result Value Ref Range Status   Specimen Description BLOOD LEFT FOREARM  Final   Special Requests   Final    BOTTLES DRAWN AEROBIC AND ANAEROBIC Blood Culture adequate volume   Culture  Setup Time   Final    GRAM POSITIVE COCCI IN CLUSTERS ANAEROBIC BOTTLE ONLY CRITICAL RESULT CALLED TO, READ BACK BY AND VERIFIED WITH: RN [facility] 1111 [id number] FCP Performed at [facility], [place] [address] [place], [place] [address]    Culture STAPHYLOCOCCUS AUREUS (A)  Final   Report Status 06/09/2021 FINAL  Final   Organism ID, Bacteria STAPHYLOCOCCUS AUREUS  Final      Susceptibility   Staphylococcus aureus - MIC*    CIPROFLOXACIN <=0.5 SENSITIVE Sensitive     ERYTHROMYCIN <=0.25 SENSITIVE Sensitive     GENTAMICIN <=0.5 SENSITIVE Sensitive     OXACILLIN 0.5 SENSITIVE Sensitive     TETRACYCLINE <=1 SENSITIVE Sensitive     VANCOMYCIN 1 SENSITIVE Sensitive     TRIMETH/SULFA <=10 SENSITIVE Sensitive     CLINDAMYCIN <=0.25 SENSITIVE Sensitive     RIFAMPIN <=0.5 SENSITIVE Sensitive     Inducible Clindamycin NEGATIVE Sensitive     * STAPHYLOCOCCUS AUREUS  Blood Culture  ID Panel (Reflexed)     Status: Abnormal   Collection Time: 06/06/21 10:30 AM  Result Value Ref Range Status   Enterococcus faecalis NOT DETECTED NOT DETECTED Final   Enterococcus Faecium NOT DETECTED NOT DETECTED Final   Listeria monocytogenes NOT DETECTED  NOT DETECTED Final   Staphylococcus species DETECTED (A) NOT DETECTED Final    Comment: CRITICAL RESULT CALLED TO, READ BACK BY AND VERIFIED WITH: RN SAM 1111 [id number] FCP    Staphylococcus aureus (BCID) DETECTED (A) NOT DETECTED Final    Comment: CRITICAL RESULT CALLED TO, READ BACK BY AND VERIFIED WITH: RN SAM 1111 [id number] FCP    Staphylococcus epidermidis NOT DETECTED NOT DETECTED Final   Staphylococcus lugdunensis NOT DETECTED NOT DETECTED Final   Streptococcus species NOT DETECTED NOT DETECTED Final   Streptococcus agalactiae NOT DETECTED NOT DETECTED Final   Streptococcus pneumoniae NOT DETECTED NOT DETECTED Final   Streptococcus pyogenes NOT DETECTED NOT DETECTED Final   A.calcoaceticus-baumannii NOT DETECTED NOT DETECTED Final   Bacteroides fragilis NOT DETECTED NOT DETECTED Final   Enterobacterales NOT DETECTED NOT DETECTED Final   Enterobacter cloacae complex NOT DETECTED NOT DETECTED Final   Escherichia coli NOT DETECTED NOT DETECTED Final   Klebsiella aerogenes NOT DETECTED NOT DETECTED Final   Klebsiella oxytoca NOT DETECTED NOT DETECTED Final   Klebsiella pneumoniae NOT DETECTED NOT DETECTED Final   Proteus species NOT DETECTED NOT DETECTED Final   Salmonella species NOT DETECTED NOT DETECTED Final   Serratia marcescens NOT DETECTED NOT DETECTED Final   Haemophilus influenzae NOT DETECTED NOT DETECTED Final   Neisseria meningitidis NOT DETECTED NOT DETECTED Final   Pseudomonas aeruginosa NOT DETECTED NOT DETECTED Final   Stenotrophomonas maltophilia NOT DETECTED NOT DETECTED Final   Candida albicans NOT DETECTED NOT DETECTED Final   Candida auris NOT DETECTED NOT DETECTED Final   Candida glabrata NOT DETECTED NOT  DETECTED Final   Candida krusei NOT DETECTED NOT DETECTED Final   Candida parapsilosis NOT DETECTED NOT DETECTED Final   Candida tropicalis NOT DETECTED NOT DETECTED Final   Cryptococcus neoformans/gattii NOT DETECTED NOT DETECTED Final   Meth resistant mecA/C and MREJ NOT DETECTED NOT DETECTED Final    Comment: Performed at [facility], [address], [place], [place] [address]  Blood culture (routine single)     Status: None (Preliminary result)   Collection Time: 06/06/21 10:35 AM   Specimen: BLOOD  Result Value Ref Range Status   Specimen Description BLOOD RIGHT ANTECUBITAL  Final   Special Requests   Final    BOTTLES DRAWN AEROBIC AND ANAEROBIC Blood Culture results may not be optimal due to an excessive volume of blood received in culture bottles   Culture   Final    NO GROWTH 4 DAYS Performed at [facility], [place] [address] [place], [place] [address]    Report Status PENDING  Incomplete  Culture, blood (routine x 2)     Status: None (Preliminary result)   Collection Time: 06/08/21  4:29 PM   Specimen: Site Not Specified; Blood  Result Value Ref Range Status   Specimen Description SITE NOT SPECIFIED  Final   Special Requests   Final    BOTTLES DRAWN AEROBIC AND ANAEROBIC Blood Culture adequate volume   Culture   Final    NO GROWTH 2 DAYS Performed at [facility], [place] [address], [place], [place] [address]    Report Status PENDING  Incomplete  Culture, blood (routine x 2)     Status: None (Preliminary result)   Collection Time: 06/08/21  5:00 PM   Specimen: BLOOD RIGHT FOREARM  Result Value Ref Range Status   Specimen Description BLOOD RIGHT FOREARM  Final   Special Requests   Final    BOTTLES  DRAWN AEROBIC AND ANAEROBIC Blood Culture adequate volume   Culture   Final    NO GROWTH 2 DAYS Performed at [facility], [place] [address] [place], [place] [address]    Report Status PENDING  Incomplete  Resp Panel by RT-PCR (Flu A&B, Covid)  Nasopharyngeal Swab     Status: None   Collection Time: 06/08/21  5:35 PM   Specimen: Nasopharyngeal Swab; Nasopharyngeal(NP) swabs in vial transport medium  Result Value Ref Range Status   SARS Coronavirus 2 by RT PCR NEGATIVE NEGATIVE Final    Comment: (NOTE) SARS-CoV-2 target nucleic acids are NOT DETECTED.  The SARS-CoV-2 RNA is generally detectable in upper respiratory specimens during the acute phase of infection. The lowest concentration of SARS-CoV-2 viral copies this assay can detect is 138 copies/mL. A negative result does not preclude SARS-Cov-2 infection and should not be used as the sole basis for treatment or other patient management decisions. A negative result may occur with  improper specimen collection/handling, submission of specimen other than nasopharyngeal swab, presence of viral mutation(s) within the areas targeted by this assay, and inadequate number of viral copies(<138 copies/mL). A negative result must be combined with clinical observations, patient history, and epidemiological information. The expected result is Negative.  Fact Sheet for Patients:  EntrepreneurPulse.com.au  Fact Sheet for Healthcare Providers:  IncredibleEmployment.be  This test is no t yet approved or cleared by the [place] FDA and  has been authorized for detection and/or diagnosis of SARS-CoV-2 by FDA under an Emergency Use Authorization (EUA). This EUA will remain  in effect (meaning this test can be used) for the duration of the COVID-19 declaration under Section 564(b)(1) of the Act, 21 U.S.C.section 360bbb-3(b)(1), unless the authorization is terminated  or revoked sooner.       Influenza A by PCR NEGATIVE NEGATIVE Final   Influenza B by PCR NEGATIVE NEGATIVE Final    Comment: (NOTE) The Xpert Xpress SARS-CoV-2/FLU/RSV plus assay is intended as an aid in the diagnosis of influenza from Nasopharyngeal swab specimens and should not be  used as a sole basis for treatment. Nasal washings and aspirates are unacceptable for Xpert Xpress SARS-CoV-2/FLU/RSV testing.  Fact Sheet for Patients: EntrepreneurPulse.com.au  Fact Sheet for Healthcare Providers: IncredibleEmployment.be  This test is not yet approved or cleared by the [place] FDA and has been authorized for detection and/or diagnosis of SARS-CoV-2 by FDA under an Emergency Use Authorization (EUA). This EUA will remain in effect (meaning this test can be used) for the duration of the COVID-19 declaration under Section 564(b)(1) of the Act, 21 U.S.C. section 360bbb-3(b)(1), unless the authorization is terminated or revoked.  Performed at [facility], [place] [address] [place], [place] [address]      Janene Madeira, MSN, NP-C [facility] [email or web address] Pager: [phone] Office: [phone] [place]: [phone]

## 2021-06-10 NOTE — Plan of Care (Signed)

## 2021-06-10 NOTE — Progress Notes (Deleted)
Patient to HD unit.

## 2021-06-10 NOTE — Progress Notes (Signed)
[facility] Progress Note   Subjective:   Patient seen and examined at bedside.  Apprehensive about having TDC removed and replaced.  Answered all questions.  In agreement for HD today followed by line holiday.  Reports feeling better this AM.  Denies CP, SOB, n/v/d, abdominal pain.  Currently on home oxygen.   Objective Vitals:   06/10/21 0500 06/10/21 0520 06/10/21 0530 06/10/21 0628  BP: (!) 166/130 (!) 177/134 (!) 177/121 (!) 175/133  Pulse: 63 64 69 67  Resp:   20 18  Temp:   97.8 F (36.6 C) 97.9 F (36.6 C)  TempSrc:   Oral Oral  SpO2:   98% 100%  Weight:   66.3 kg    Physical Exam General:chronically ill appearing female in NAD Heart:RRR, no mrg Lungs:CTAB, nml WOB on 2L O2 via [place] Abdomen:soft,NTND Extremities:trace LE edema Skin - rash noted on face and extremities  Dialysis Access: R [facility]   Filed Weights   06/10/21 0530  Weight: 66.3 kg    Intake/Output Summary (Last 24 hours) at 06/10/2021 1320 Last data filed at 06/10/2021 0900 Gross per 24 hour  Intake 720 ml  Output 1500 ml  Net -780 ml    Additional Objective Labs: Basic Metabolic Panel: Recent Labs  Lab 06/08/21 1430 06/08/21 1923 06/09/21 0356 06/10/21 0158  NA 139  --  138 137  K 3.4*  --  4.0 4.2  CL 102  --  102 101  CO2 25  --  23 24  GLUCOSE 87  --  168* 131*  BUN 38*  --  40* 52*  CREATININE 9.90* 9.38* 9.58* 9.52*  CALCIUM 7.4*  --  7.6* 7.5*  PHOS  --   --   --  6.0*   Liver Function Tests: Recent Labs  Lab 06/06/21 1035 06/08/21 1430 06/09/21 0356 06/10/21 0158  AST 24 31 37  --   ALT '12 16 17  '$ --   ALKPHOS 140* 156* 199*  --   BILITOT 1.1 0.6 0.6  --   PROT 5.3* 5.8* 5.9*  --   ALBUMIN 2.1* 2.3* 2.2* 2.1*    CBC: Recent Labs  Lab 06/06/21 1035 06/08/21 1430 06/08/21 1923 06/09/21 0356 06/10/21 0158  WBC 5.7 6.7 6.6 5.7 4.4  NEUTROABS 4.9 4.6  --   --  3.2  HGB 7.8* 8.1* 8.7* 8.2* 8.2*  HCT 26.1* 27.5* 29.5* 27.0* 26.6*  MCV 84.7 84.4 83.6  83.1 82.1  PLT 200 233 248 219 236   Blood Culture    Component Value Date/Time   SDES BLOOD RIGHT FOREARM 06/08/2021 1700   SPECREQUEST  06/08/2021 1700    BOTTLES DRAWN AEROBIC AND ANAEROBIC Blood Culture adequate volume   CULT  06/08/2021 1700    NO GROWTH 2 DAYS Performed at [facility], [address], [place], [place] [address]    REPTSTATUS PENDING 06/08/2021 1700    Iron Studies:  Recent Labs    06/09/21 1547 06/10/21 0158  IRON 56  --   TIBC 210*  --   FERRITIN  --  1,224*   Lab Results  Component Value Date   INR 1.0 06/08/2021   INR 1.1 06/06/2021   INR 1.0 12/03/2020   Studies/Results: ECHOCARDIOGRAM COMPLETE  Result Date: 06/08/2021    ECHOCARDIOGRAM REPORT   Patient Name:   Cathy [name] Date of Exam: 06/08/2021 Medical Rec #:  [id number]        Height:  67.0 in Accession #:    [id number]       Weight:       147.0 lb Date of Birth:  01-Jun-1981       BSA:          1.774 m Patient Age:    39 years         BP:           155/119 mmHg Patient Gender: F                HR:           70 bpm. Exam Location:  Inpatient Procedure: 2D Echo, Cardiac Doppler and Color Doppler Indications:     Endocarditis  History:         Patient has prior history of Echocardiogram examinations, most                  recent 11/15/2020. Risk Factors:Former Smoker. Substance abuse.                  Anasarca. Lupus. Moderate pericardial effusion.  Sonographer:     Merrie Roof RDCS Referring Phys:  [id number] Janeece Fitting Diagnosing Phys: Rudean Haskell MD IMPRESSIONS  1. Left ventricular ejection fraction, by estimation, is 25 to 30%. The left ventricle has severely decreased function. The left ventricle demonstrates global hypokinesis. There is mild concentric left ventricular hypertrophy. Left ventricular diastolic  parameters are consistent with Grade II diastolic dysfunction (pseudonormalization).  2. Right ventricular systolic function is normal. The right ventricular size is  normal. Mildly increased right ventricular wall thickness. There is normal pulmonary artery systolic pressure.  3. Left atrial size was severely dilated.  4. Right atrial size was severely dilated.  5. Moderate pericardial effusion. There is no evidence of cardiac tamponade.  6. The mitral valve is grossly normal. Mild to moderate mitral valve regurgitation.  7. The tricuspid valve is abnormal- small 0.44 echodensity seen on the ventricular side of the tricuspid valve (Image 19/85. Tricuspid valve regurgitation is mild to moderate.  8. The aortic valve is tricuspid. Aortic valve regurgitation is mild. No aortic stenosis is present. Comparison(s): A prior study was performed on 11/15/2020. Prior images reviewed side by side. LVEF is significantly worse. Small echo density on tricuspid valve was only seen in PSAX axis (not well visualized in prior study). Conclusion(s)/Recommendation(s): TEE could be considered given the clinical question of endocarditis. Addendum: Primary team updated. FINDINGS  Left Ventricle: Left ventricular ejection fraction, by estimation, is 25 to 30%. The left ventricle has severely decreased function. The left ventricle demonstrates global hypokinesis. The left ventricular internal cavity size was normal in size. There is mild concentric left ventricular hypertrophy. Left ventricular diastolic parameters are consistent with Grade II diastolic dysfunction (pseudonormalization). Right Ventricle: The right ventricular size is normal. Mildly increased right ventricular wall thickness. Right ventricular systolic function is normal. There is normal pulmonary artery systolic pressure. The tricuspid regurgitant velocity is 2.71 m/s, and with an assumed right atrial pressure of 3 mmHg, the estimated right ventricular systolic pressure is [id number] mmHg. Left Atrium: Left atrial size was severely dilated. Right Atrium: Right atrial size was severely dilated. Pericardium: A moderately sized pericardial  effusion is present. There is no evidence of cardiac tamponade. Mitral Valve: The mitral valve is grossly normal. Mild to moderate mitral valve regurgitation. Tricuspid Valve: Small 0.44 echodensity seen on the ventricular side of the tricuspid valve (Image 19/85). The tricuspid valve is abnormal. Tricuspid valve regurgitation is mild  to moderate. No evidence of tricuspid stenosis. Aortic Valve: The aortic valve is tricuspid. Aortic valve regurgitation is mild. No aortic stenosis is present. Aortic valve mean gradient measures 5.0 mmHg. Aortic valve peak gradient measures 8.3 mmHg. Aortic valve area, by VTI measures 2.08 cm. Pulmonic Valve: The pulmonic valve was normal in structure. Pulmonic valve regurgitation is mild. No evidence of pulmonic stenosis. Aorta: The aortic root and ascending aorta are structurally normal, with no evidence of dilitation. IAS/Shunts: The atrial septum is grossly normal.  LEFT VENTRICLE PLAX 2D LVIDd:         5.40 cm  Diastology LVIDs:         4.70 cm  LV e' medial:    6.64 cm/s LV PW:         1.20 cm  LV E/e' medial:  17.2 LV IVS:        1.10 cm  LV e' lateral:   7.51 cm/s LVOT diam:     1.90 cm  LV E/e' lateral: 15.2 LV SV:         62 LV SV Index:   35 LVOT Area:     2.84 cm  RIGHT VENTRICLE             IVC RV Basal diam:  5.30 cm     IVC diam: 2.00 cm RV Mid diam:    3.40 cm RV S prime:     15.10 cm/s TAPSE (M-mode): 2.8 cm LEFT ATRIUM            Index       RIGHT ATRIUM           Index LA diam:      4.60 cm  2.59 cm/m  RA Area:     28.60 cm LA Vol (A2C): 97.2 ml  54.78 ml/m RA Volume:   111.00 ml 62.55 ml/m LA Vol (A4C): 116.0 ml 65.37 ml/m  AORTIC VALVE AV Area (Vmax):    2.42 cm AV Area (Vmean):   2.07 cm AV Area (VTI):     2.08 cm AV Vmax:           144.00 cm/s AV Vmean:          99.200 cm/s AV VTI:            0.299 m AV Peak Grad:      8.3 mmHg AV Mean Grad:      5.0 mmHg LVOT Vmax:         123.00 cm/s LVOT Vmean:        72.300 cm/s LVOT VTI:          0.219 m LVOT/AV  VTI ratio: 0.73  AORTA Ao Root diam: 3.30 cm Ao Asc diam:  3.40 cm MITRAL VALVE                TRICUSPID VALVE MV Area (PHT): 4.80 cm     TR Peak grad:   29.4 mmHg MV Decel Time: 158 msec     TR Vmax:        271.00 cm/s MV E velocity: 114.00 cm/s MV A velocity: 69.20 cm/s   SHUNTS MV E/A ratio:  1.65         Systemic VTI:  0.22 m                             Systemic Diam: 1.90 cm Rudean Haskell MD Electronically signed by Rudean Haskell MD Signature Date/Time:  06/08/2021/5:41:40 PM    Final (Updated)     Medications:   ceFAZolin (ANCEF) IV 1 g (06/09/21 1742)    (feeding supplement) PROSource Plus  30 mL Oral TID BM   calcium acetate  1,334 mg Oral TID WC   carvedilol  12.5 mg Oral BID WC   Chlorhexidine Gluconate Cloth  6 each Topical Q0600   Chlorhexidine Gluconate Cloth  6 each Topical Q0600   clobetasol cream   Topical BID   cloNIDine  0.3 mg Oral TID   darbepoetin (ARANESP) injection - DIALYSIS  150 mcg Intravenous Q Mon-HD   doxercalciferol  5 mcg Intravenous Q M,W,F-HD   heparin  5,000 Units Subcutaneous Q8H   heparin sodium (porcine)       irbesartan  75 mg Oral Daily   pantoprazole  40 mg Oral Daily   predniSONE  40 mg Oral Q breakfast   torsemide  60 mg Oral BID   triamcinolone   Topical BID    Dialysis Orders: [facility] - [facility]  4hrs, BFR 400, DFR 800,  EDW 67kg, 2K/ 2Ca   Mircera 200 mcg q2wks - last 05/28/21 Hectorol 59mg IV qHD - last 05/30/21 Venofer '50mg'$  IV weekly - last 05/28/21 Calcium Acetate '667mg'$  2 tabs TID with meals   Last Labs: Hgb 8.2, TSAT 11 (OP HD 05/07/21), K 4.0, Ca 7.6, Alb 2.2   Assessment/Plan: MSSA Bacteremia: Blood Cx 7/7 (+) MSSA sensitive to oxacillin. Blood cultures X 2 were repeated 7/9 with NGTD. ID consulted recommend line holiday and continue cefazolin. Small echodensity noted on TV, needs TEE.  Lupus Flare/Systemic Rash: Continue Prednisone New Onset HFrEF: ECHO 7/9 showed worsened EF 25-30%, G2DD, moderate  pleural effusion-no evidence of cardiac tamponade, and a small echodensity on TV. Cardiology consulted. ESRD - on HD TTS. Patient with known history of noncompliance with HD. Noted last HD on 05/30/21. HD early this AM with plan for HD again today.  [facility]  Tentative plan to replace hopefully Thursday followed by HD.  Appreciate IR assistance.  Hypertension/volume  - Blood pressure remains elevated. No improvement with HD overnight.  Continue home meds.  Does not appear grossly volume overloaded.  Net UF 1.5L overnight.  Under dry if weights correct.  Continue UF as tolerated.  May need to lower dry on d/c.  Anemia of CKD - Hgb now 8.2-will resume Aranesp. First dose to be given today. Secondary Hyperparathyroidism - Corr Ca ok.  Phos elevated.  Continue VDRA and binders. Nutrition - Albumin low 2.2-will add protein supplements. Renal diet w/fluid restrictions.   LJen Mow PA-C [facility] 06/10/2021,1:20 PM  LOS: 2 days

## 2021-06-10 NOTE — Progress Notes (Signed)
PROGRESS NOTE  Cathy [name] [id number] DOB: 23-Jun-1981 DOA: 06/08/2021 PCP: Pcp, No   LOS: 2 days   Brief Narrative / Interim history: 40 year old female with history of lupus, ESRD on HD TTS since January 2022, asthma, HTN, comes into the hospital after being called at home that her blood cultures were positive.  On 7/7 she went to dialysis, had a fever of 102, new onset rash to her face and legs and was sent to the ED.  She was diagnosed with a lupus flare, was placed on steroids as well as doxycycline and sent home.  Blood cultures were obtained during that ED visit, and returned positive for MSSA.  Patient was called and was admitted to the hospital on 7/9.  Subjective / 24h Interval events: Underwent dialysis around 3 AM this morning, feeling better.  Less tightness in her legs, less pain.  Less bloating.  Assessment & Plan: Principal Problem MSSA bacteremia, POA-patient not septic on admission, she is afebrile, no leukocytosis, not tachycardic nor tachypneic.  Initial blood cultures obtained on 7/7 in the ED visit finalized on 7/10 with MSSA sensitive to oxacillin.  She is currently on Ancef -Infectious disease following, recommending tunneled HD cath line holiday.  She got dialyzed 7/10, will get dialyzed again 7/11 and plan to remove dialysis catheter likely on 7/12.  She will need tunneled HD cath replaced on Thursday 7/14 to resume HD --2D echo done 7/9 showed EF 25-30% with severely decreased LV function/global hypokinesis, grade 2 diastolic dysfunction.  RV was normal.  She had a moderate pericardial effusion without evidence of cardiac tamponade.  Her tricuspid valve showed a small 0.44 echodensity on the ventricular side of the tricuspid valve. I have contacted cardiology regarding her TEE, she is tentatively been scheduled for Friday but they are working to get her in sooner  Active Problems ESRD on HD TTS-she is followed by Dr. Hollie Salk as an outpatient.  This is likely due to  her lupus. She was hopeful for renal recovery and was somewhat resistant to having place an AV fistula but now scheduled with vascular surgery at the end of this month as an outpatient -She remains worried and resistant to have the tunneled HD line removed due to past bad experience when he was placed back in January   Lupus flare /systemic rash -she also has associated bilateral bilateral joint pains. She was given steroids in the ER, continue prednisone. 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  Her pericardial effusion is likely in the setting of lupus, she is on steroids. -Continue torsemide, will need ongoing HD for volume removal   Essential hypertension -Blood pressure acceptable but slightly on the high side.  Continue regimen as below   Asthma -No wheezing, this appears stable   Scheduled Meds:  (feeding supplement) PROSource Plus  30 mL Oral TID BM   calcium acetate  1,334 mg Oral TID WC   Chlorhexidine Gluconate Cloth  6 each Topical Q0600   Chlorhexidine Gluconate Cloth  6 each Topical Q0600   clobetasol cream   Topical BID   cloNIDine  0.3 mg Oral TID   darbepoetin (ARANESP) injection - DIALYSIS  150 mcg Intravenous Q Mon-HD   doxercalciferol  5 mcg Intravenous Q M,W,F-HD   heparin  5,000 Units Subcutaneous Q8H   heparin sodium (porcine)       labetalol  400 mg Oral BID  pantoprazole  40 mg Oral Daily   predniSONE  40 mg Oral Q breakfast   torsemide  60 mg Oral BID   triamcinolone   Topical BID   Continuous Infusions:   ceFAZolin (ANCEF) IV 1 g (06/09/21 1742)   PRN Meds:.acetaminophen **OR** acetaminophen, albuterol, alum & mag hydroxide-simeth, hydrALAZINE, ondansetron **OR** ondansetron (ZOFRAN) IV, oxyCODONE, polyethylene glycol  Diet Orders (From admission, onward)     Start     Ordered   06/08/21 1843  Diet renal with fluid  restriction Fluid restriction: 1200 mL Fluid; Room service appropriate? Yes; Fluid consistency: Thin  Diet effective now       Question Answer Comment  Fluid restriction: 1200 mL Fluid   Room service appropriate? Yes   Fluid consistency: Thin      06/08/21 1842            DVT prophylaxis: heparin injection 5,000 Units Start: 06/08/21 2200     Code Status: Full Code  Family Communication: No family at bedside  Status is: Inpatient  Remains inpatient appropriate because:Inpatient level of care appropriate due to severity of illness  Dispo: The patient is from: Home              Anticipated d/c is to: Home              Patient currently is not medically stable to d/c.   Difficult to place patient No   Level of care: Med-Surg  Consultants:  Infectious disease Nephrology Cardiology  Procedures:  2D echo  Microbiology  Blood cultures 7/7-MSSA Blood culture 7/9-no growth to date  Antimicrobials: Cefazolin 7/9 >>  Objective: Vitals:   06/10/21 0500 06/10/21 0520 06/10/21 0530 06/10/21 0628  BP: (!) 166/130 (!) 177/134 (!) 177/121 (!) 175/133  Pulse: 63 64 69 67  Resp:   20 18  Temp:   97.8 F (36.6 C) 97.9 F (36.6 C)  TempSrc:   Oral Oral  SpO2:   98% 100%  Weight:   66.3 kg     Intake/Output Summary (Last 24 hours) at 06/10/2021 1131 Last data filed at 06/10/2021 0900 Gross per 24 hour  Intake 720 ml  Output 1500 ml  Net -780 ml    Filed Weights   06/10/21 0530  Weight: 66.3 kg    Examination:  Constitutional: NAD Eyes: No icterus ENMT: Moist mucous membranes Neck: normal, supple Respiratory: Clear bilaterally, normal respiratory effort, no wheezing Cardiovascular: Regular rate and rhythm, no murmurs, trace edema Abdomen: Mildly distended, Bowel sounds positive.  Musculoskeletal: no clubbing / cyanosis.  Skin: Erythematous raised rash on malar surfaces, nose, upper lip, on the forehead as well as lower extremities with few small ulcerated  lesions on various stages of healing on bilateral lower extremities.  No mucosal involvement noted.  Significantly improved today Neurologic: Nonfocal  Data Reviewed: I have independently reviewed following labs and imaging studies   CBC: Recent Labs  Lab 06/06/21 1035 06/08/21 1430 06/08/21 1923 06/09/21 0356 06/10/21 0158  WBC 5.7 6.7 6.6 5.7 4.4  NEUTROABS 4.9 4.6  --   --  3.2  HGB 7.8* 8.1* 8.7* 8.2* 8.2*  HCT 26.1* 27.5* 29.5* 27.0* 26.6*  MCV 84.7 84.4 83.6 83.1 82.1  PLT 200 233 248 219 [id number]    Basic Metabolic Panel: Recent Labs  Lab 06/06/21 1035 06/08/21 1430 06/08/21 1923 06/09/21 0356 06/10/21 0158  NA 140 139  --  138 137  K 3.4* 3.4*  --  4.0 4.2  CL 104 102  --  102 101  CO2 27 25  --  23 24  GLUCOSE 98 87  --  168* 131*  BUN 29* 38*  --  40* 52*  CREATININE 9.88* 9.90* 9.38* 9.58* 9.52*  CALCIUM 7.5* 7.4*  --  7.6* 7.5*  MG  --   --   --  1.7  --   PHOS  --   --   --   --  6.0*    Liver Function Tests: Recent Labs  Lab 06/06/21 1035 06/08/21 1430 06/09/21 0356 06/10/21 0158  AST 24 31 37  --   ALT '12 16 17  '$ --   ALKPHOS 140* 156* 199*  --   BILITOT 1.1 0.6 0.6  --   PROT 5.3* 5.8* 5.9*  --   ALBUMIN 2.1* 2.3* 2.2* 2.1*    Coagulation Profile: Recent Labs  Lab 06/06/21 1035 06/08/21 1923  INR 1.1 1.0    HbA1C: No results for input(s): HGBA1C in the last 72 hours. CBG: No results for input(s): GLUCAP in the last 168 hours.  Recent Results (from the past 240 hour(s))  Culture, blood (Routine X 2) w Reflex to ID Panel     Status: Abnormal   Collection Time: 06/06/21 10:30 AM   Specimen: BLOOD LEFT FOREARM  Result Value Ref Range Status   Specimen Description BLOOD LEFT FOREARM  Final   Special Requests   Final    BOTTLES DRAWN AEROBIC AND ANAEROBIC Blood Culture adequate volume   Culture  Setup Time   Final    GRAM POSITIVE COCCI IN CLUSTERS ANAEROBIC BOTTLE ONLY CRITICAL RESULT CALLED TO, READ BACK BY AND VERIFIED WITH: RN  [facility] 1111 [id number] FCP Performed at [facility], [place] [address], [place], [place] [address]    Culture STAPHYLOCOCCUS AUREUS (A)  Final   Report Status 06/09/2021 FINAL  Final   Organism ID, Bacteria STAPHYLOCOCCUS AUREUS  Final      Susceptibility   Staphylococcus aureus - MIC*    CIPROFLOXACIN <=0.5 SENSITIVE Sensitive     ERYTHROMYCIN <=0.25 SENSITIVE Sensitive     GENTAMICIN <=0.5 SENSITIVE Sensitive     OXACILLIN 0.5 SENSITIVE Sensitive     TETRACYCLINE <=1 SENSITIVE Sensitive     VANCOMYCIN 1 SENSITIVE Sensitive     TRIMETH/SULFA <=10 SENSITIVE Sensitive     CLINDAMYCIN <=0.25 SENSITIVE Sensitive     RIFAMPIN <=0.5 SENSITIVE Sensitive     Inducible Clindamycin NEGATIVE Sensitive     * STAPHYLOCOCCUS AUREUS  Blood Culture ID Panel (Reflexed)     Status: Abnormal   Collection Time: 06/06/21 10:30 AM  Result Value Ref Range Status   Enterococcus faecalis NOT DETECTED NOT DETECTED Final   Enterococcus Faecium NOT DETECTED NOT DETECTED Final   Listeria monocytogenes NOT DETECTED NOT DETECTED Final   Staphylococcus species DETECTED (A) NOT DETECTED Final    Comment: CRITICAL RESULT CALLED TO, READ BACK BY AND VERIFIED WITH: RN SAM 1111 [id number] FCP    Staphylococcus aureus (BCID) DETECTED (A) NOT DETECTED Final    Comment: CRITICAL RESULT CALLED TO, READ BACK BY AND VERIFIED WITH: RN SAM 1111 [id number] FCP    Staphylococcus epidermidis NOT DETECTED NOT DETECTED Final   Staphylococcus lugdunensis NOT DETECTED NOT DETECTED Final   Streptococcus species NOT DETECTED NOT DETECTED Final   Streptococcus agalactiae NOT DETECTED NOT DETECTED Final   Streptococcus pneumoniae NOT DETECTED NOT DETECTED Final   Streptococcus pyogenes NOT DETECTED NOT DETECTED Final   A.calcoaceticus-baumannii NOT DETECTED NOT DETECTED Final  Bacteroides fragilis NOT DETECTED NOT DETECTED Final   Enterobacterales NOT DETECTED NOT DETECTED Final   Enterobacter cloacae complex NOT DETECTED NOT DETECTED  Final   Escherichia coli NOT DETECTED NOT DETECTED Final   Klebsiella aerogenes NOT DETECTED NOT DETECTED Final   Klebsiella oxytoca NOT DETECTED NOT DETECTED Final   Klebsiella pneumoniae NOT DETECTED NOT DETECTED Final   Proteus species NOT DETECTED NOT DETECTED Final   Salmonella species NOT DETECTED NOT DETECTED Final   Serratia marcescens NOT DETECTED NOT DETECTED Final   Haemophilus influenzae NOT DETECTED NOT DETECTED Final   Neisseria meningitidis NOT DETECTED NOT DETECTED Final   Pseudomonas aeruginosa NOT DETECTED NOT DETECTED Final   Stenotrophomonas maltophilia NOT DETECTED NOT DETECTED Final   Candida albicans NOT DETECTED NOT DETECTED Final   Candida auris NOT DETECTED NOT DETECTED Final   Candida glabrata NOT DETECTED NOT DETECTED Final   Candida krusei NOT DETECTED NOT DETECTED Final   Candida parapsilosis NOT DETECTED NOT DETECTED Final   Candida tropicalis NOT DETECTED NOT DETECTED Final   Cryptococcus neoformans/gattii NOT DETECTED NOT DETECTED Final   Meth resistant mecA/C and MREJ NOT DETECTED NOT DETECTED Final    Comment: Performed at [facility], [address] [place], [place] [address]  Blood culture (routine single)     Status: None (Preliminary result)   Collection Time: 06/06/21 10:35 AM   Specimen: BLOOD  Result Value Ref Range Status   Specimen Description BLOOD RIGHT ANTECUBITAL  Final   Special Requests   Final    BOTTLES DRAWN AEROBIC AND ANAEROBIC Blood Culture results may not be optimal due to an excessive volume of blood received in culture bottles   Culture   Final    NO GROWTH 4 DAYS Performed at [facility], [place] [address], [place], [place] [address]    Report Status PENDING  Incomplete  Culture, blood (routine x 2)     Status: None (Preliminary result)   Collection Time: 06/08/21  4:29 PM   Specimen: Site Not Specified; Blood  Result Value Ref Range Status   Specimen Description SITE NOT SPECIFIED  Final   Special Requests    Final    BOTTLES DRAWN AEROBIC AND ANAEROBIC Blood Culture adequate volume   Culture   Final    NO GROWTH 2 DAYS Performed at [facility], [place] [address] [place], [place] [address]    Report Status PENDING  Incomplete  Culture, blood (routine x 2)     Status: None (Preliminary result)   Collection Time: 06/08/21  5:00 PM   Specimen: BLOOD RIGHT FOREARM  Result Value Ref Range Status   Specimen Description BLOOD RIGHT FOREARM  Final   Special Requests   Final    BOTTLES DRAWN AEROBIC AND ANAEROBIC Blood Culture adequate volume   Culture   Final    NO GROWTH 2 DAYS Performed at [facility], [place] [address], [place], [place] [address]    Report Status PENDING  Incomplete  Resp Panel by RT-PCR (Flu A&B, Covid) Nasopharyngeal Swab     Status: None   Collection Time: 06/08/21  5:35 PM   Specimen: Nasopharyngeal Swab; Nasopharyngeal(NP) swabs in vial transport medium  Result Value Ref Range Status   SARS Coronavirus 2 by RT PCR NEGATIVE NEGATIVE Final    Comment: (NOTE) SARS-CoV-2 target nucleic acids are NOT DETECTED.  The SARS-CoV-2 RNA is generally detectable in upper respiratory specimens during the acute phase of infection. The lowest  concentration of SARS-CoV-2 viral copies this assay can detect is 138 copies/mL. A negative result does not preclude SARS-Cov-2 infection and should not be used as the sole basis for treatment or other patient management decisions. A negative result may occur with  improper specimen collection/handling, submission of specimen other than nasopharyngeal swab, presence of viral mutation(s) within the areas targeted by this assay, and inadequate number of viral copies(<138 copies/mL). A negative result must be combined with clinical observations, patient history, and epidemiological information. The expected result is Negative.  Fact Sheet for Patients:  EntrepreneurPulse.com.au  Fact Sheet for Healthcare Providers:   IncredibleEmployment.be  This test is no t yet approved or cleared by the [place] FDA and  has been authorized for detection and/or diagnosis of SARS-CoV-2 by FDA under an Emergency Use Authorization (EUA). This EUA will remain  in effect (meaning this test can be used) for the duration of the COVID-19 declaration under Section 564(b)(1) of the Act, 21 U.S.C.section 360bbb-3(b)(1), unless the authorization is terminated  or revoked sooner.       Influenza A by PCR NEGATIVE NEGATIVE Final   Influenza B by PCR NEGATIVE NEGATIVE Final    Comment: (NOTE) The Xpert Xpress SARS-CoV-2/FLU/RSV plus assay is intended as an aid in the diagnosis of influenza from Nasopharyngeal swab specimens and should not be used as a sole basis for treatment. Nasal washings and aspirates are unacceptable for Xpert Xpress SARS-CoV-2/FLU/RSV testing.  Fact Sheet for Patients: EntrepreneurPulse.com.au  Fact Sheet for Healthcare Providers: IncredibleEmployment.be  This test is not yet approved or cleared by the [place] FDA and has been authorized for detection and/or diagnosis of SARS-CoV-2 by FDA under an Emergency Use Authorization (EUA). This EUA will remain in effect (meaning this test can be used) for the duration of the COVID-19 declaration under Section 564(b)(1) of the Act, 21 U.S.C. section 360bbb-3(b)(1), unless the authorization is terminated or revoked.  Performed at [facility], [place] [address], [place], [place] [address]       Radiology Studies: No results found.  Marzetta Board, MD, PhD [facility]  Between 7 am - 7 pm I am available, please contact me via Amion (for emergencies) or Securechat (non urgent messages)  Between 7 pm - 7 am I am not available, please contact night coverage MD/APP via Amion

## 2021-06-10 NOTE — Progress Notes (Signed)
Pt left unit to HD. Pt remains alert/oriented in no apparent distress. No complaints.

## 2021-06-11 ENCOUNTER — Inpatient Hospital Stay (HOSPITAL_COMMUNITY): Payer: Medicare Other

## 2021-06-11 DIAGNOSIS — I313 Pericardial effusion (noninflammatory): Secondary | ICD-10-CM

## 2021-06-11 DIAGNOSIS — I5041 Acute combined systolic (congestive) and diastolic (congestive) heart failure: Secondary | ICD-10-CM

## 2021-06-11 HISTORY — PX: IR REMOVAL TUN CV CATH W/O FL: IMG2289

## 2021-06-11 LAB — CULTURE, BLOOD (SINGLE): Culture: NO GROWTH

## 2021-06-11 LAB — BASIC METABOLIC PANEL
Anion gap: 9 (ref 5–15)
BUN: 14 mg/dL (ref 6–20)
CO2: 30 mmol/L (ref 22–32)
Calcium: 8 mg/dL — ABNORMAL LOW (ref 8.9–10.3)
Chloride: 97 mmol/L — ABNORMAL LOW (ref 98–111)
Creatinine, Ser: 3.28 mg/dL — ABNORMAL HIGH (ref 0.44–1.00)
GFR, Estimated: 18 mL/min — ABNORMAL LOW (ref >60)
Glucose, Bld: 106 mg/dL — ABNORMAL HIGH (ref 70–99)
Potassium: 3.3 mmol/L — ABNORMAL LOW (ref 3.5–5.1)
Sodium: 136 mmol/L (ref 135–145)

## 2021-06-11 LAB — CBC
HCT: 27 % — ABNORMAL LOW (ref 36.0–46.0)
Hemoglobin: 8.4 g/dL — ABNORMAL LOW (ref 12.0–15.0)
MCH: 25.8 pg — ABNORMAL LOW (ref 26.0–34.0)
MCHC: 31.1 g/dL (ref 30.0–36.0)
MCV: 83.1 fL (ref 80.0–100.0)
Platelets: 275 10*3/uL (ref 150–400)
RBC: 3.25 MIL/uL — ABNORMAL LOW (ref 3.87–5.11)
RDW: 20.6 % — ABNORMAL HIGH (ref 11.5–15.5)
WBC: 8.1 10*3/uL (ref 4.0–10.5)
nRBC: 0.2 % (ref 0.0–0.2)

## 2021-06-11 LAB — PATHOLOGIST SMEAR REVIEW

## 2021-06-11 MED ORDER — LIDOCAINE HCL 1 % IJ SOLN
INTRAMUSCULAR | Status: AC | PRN
  Administered 2021-06-11: 10 mL

## 2021-06-11 MED ORDER — DARBEPOETIN ALFA 150 MCG/0.3ML IJ SOSY
150.0000 ug | PREFILLED_SYRINGE | INTRAMUSCULAR | Status: DC
  Administered 2021-06-13: 150 ug via INTRAVENOUS

## 2021-06-11 NOTE — Progress Notes (Addendum)
[facility] Progress Note   Subjective:   Patient seen and examined at bedside.  Dialysis catheter removed this AM, tolerated procedure well.  Reports intermittent SOB, cardiology already ordered torsemide.  Oxygenating well on baseline O2. Feeling depressed due to her current illness.  Denies CP, n/v/d and abdominal pain.   Objective Vitals:   06/10/21 2257 06/11/21 0741 06/11/21 1048 06/11/21 1337  BP: (!) 183/135 (!) 153/129 (!) 156/123 (!) 156/128  Pulse: 77 70 75 73  Resp: 17 18    Temp: 97.7 F (36.5 C) 98.3 F (36.8 C)    TempSrc: Oral Oral    SpO2: 100% 100% 100%   Weight:       Physical Exam General:chronically ill appearing female in NAD Heart:RRR, no mrg Lungs:CTAB, faint crackles in b/l bases Abdomen:soft, NTND Extremities: 1+ edema in b/l calves Dialysis Access: none   Filed Weights   06/10/21 0530 06/10/21 1720  Weight: 66.3 kg 68 kg    Intake/Output Summary (Last 24 hours) at 06/11/2021 1531 Last data filed at 06/10/2021 2145 Gross per 24 hour  Intake --  Output 2000 ml  Net -2000 ml    Additional Objective Labs: Basic Metabolic Panel: Recent Labs  Lab 06/09/21 0356 06/10/21 0158 06/11/21 0208  NA 138 137 136  K 4.0 4.2 3.3*  CL 102 101 97*  CO2 '23 24 30  '$ GLUCOSE 168* 131* 106*  BUN 40* 52* 14  CREATININE 9.58* 9.52* 3.28*  CALCIUM 7.6* 7.5* 8.0*  PHOS  --  6.0*  --    Liver Function Tests: Recent Labs  Lab 06/06/21 1035 06/08/21 1430 06/09/21 0356 06/10/21 0158  AST 24 31 37  --   ALT '12 16 17  '$ --   ALKPHOS 140* 156* 199*  --   BILITOT 1.1 0.6 0.6  --   PROT 5.3* 5.8* 5.9*  --   ALBUMIN 2.1* 2.3* 2.2* 2.1*   CBC: Recent Labs  Lab 06/06/21 1035 06/08/21 1430 06/08/21 1923 06/09/21 0356 06/10/21 0158 06/11/21 0208  WBC 5.7 6.7 6.6 5.7 4.4 8.1  NEUTROABS 4.9 4.6  --   --  3.2  --   HGB 7.8* 8.1* 8.7* 8.2* 8.2* 8.4*  HCT 26.1* 27.5* 29.5* 27.0* 26.6* 27.0*  MCV 84.7 84.4 83.6 83.1 82.1 83.1  PLT 200 233  248 219 236 275   Blood Culture    Component Value Date/Time   SDES BLOOD RIGHT FOREARM 06/08/2021 1700   SPECREQUEST  06/08/2021 1700    BOTTLES DRAWN AEROBIC AND ANAEROBIC Blood Culture adequate volume   CULT  06/08/2021 1700    NO GROWTH 3 DAYS Performed at [facility], [address] [place], [place] [address]    REPTSTATUS PENDING 06/08/2021 1700   Iron Studies:  Recent Labs    06/09/21 1547 06/10/21 0158  IRON 56  --   TIBC 210*  --   FERRITIN  --  1,224*   Lab Results  Component Value Date   INR 1.0 06/08/2021   INR 1.1 06/06/2021   INR 1.0 12/03/2020   Studies/Results: IR Removal Tun Cv Cath W/O FL  Result Date: 06/11/2021 INDICATION: Patient with history of lupus nephritis and previously placed right internal jugular tunneled HD catheter on 11/30/2020; she now presents with staph bacteremia and request received for catheter removal EXAM: REMOVAL TUNNELED CENTRAL VENOUS CATHETER MEDICATIONS: % lidocaine to skin and subcutaneous tissue ANESTHESIA/SEDATION: None FLUOROSCOPY TIME:  None COMPLICATIONS: None immediate. PROCEDURE: Informed written consent was obtained from  the patient after a thorough discussion of the procedural risks, benefits and alternatives. All questions were addressed. Maximal Sterile Barrier Technique was utilized including caps, mask, sterile gowns, sterile gloves, sterile drape, hand hygiene and skin antiseptic. A timeout was performed prior to the initiation of the procedure. The patient's right chest and catheter was prepped and draped in a normal sterile fashion. Heparin was removed from both ports of catheter. 1% lidocaine was used for local anesthesia. Using gentle blunt dissection the cuff of the catheter was exposed and the catheter was removed in it's entirety. Pressure was held till hemostasis was obtained. A sterile dressing was applied. The patient tolerated the procedure well with no immediate complications. IMPRESSION: Successful  catheter removal as described above. Read by: Rowe , PA-C Electronically Signed   By: Corrie Mckusick D.O.   On: 06/11/2021 10:16   DG CHEST PORT 1 VIEW  Result Date: 06/11/2021 CLINICAL DATA:  Dyspnea.  Chest pain. EXAM: PORTABLE CHEST 1 VIEW COMPARISON:  06/06/2021 FINDINGS: Normal heart size scratch set stable cardiomediastinal contours. Persistent bilateral pleural effusions, left greater than right. Mild interstitial edema. No airspace consolidation. IMPRESSION: Persistent bilateral pleural effusions, left greater than right. Mild interstitial edema. Electronically Signed   By: Kerby Moors M.D.   On: 06/11/2021 13:39    Medications:   ceFAZolin (ANCEF) IV 1 g (06/10/21 2345)    (feeding supplement) PROSource Plus  30 mL Oral TID BM   calcium acetate  1,334 mg Oral TID WC   carvedilol  12.5 mg Oral BID WC   Chlorhexidine Gluconate Cloth  6 each Topical Q0600   Chlorhexidine Gluconate Cloth  6 each Topical Q0600   clobetasol cream   Topical BID   cloNIDine  0.3 mg Oral TID   darbepoetin (ARANESP) injection - DIALYSIS  150 mcg Intravenous Q Mon-HD   doxercalciferol  5 mcg Intravenous Q M,W,F-HD   heparin  5,000 Units Subcutaneous Q8H   irbesartan  75 mg Oral Daily   pantoprazole  40 mg Oral Daily   predniSONE  40 mg Oral Q breakfast   torsemide  60 mg Oral BID   triamcinolone   Topical BID    Dialysis Orders: TTS - [facility]  4hrs, BFR 400, DFR 800,  EDW 67kg, 2K/ 2Ca   Mircera 200 mcg q2wks - last 05/28/21 Hectorol 92mg IV qHD - last 05/30/21 Venofer '50mg'$  IV weekly - last 05/28/21 Calcium Acetate '667mg'$  2 tabs TID with meals   Last Labs: Hgb 8.2, TSAT 11 (OP HD 05/07/21), K 4.0, Ca 7.6, Alb 2.2   Assessment/Plan: MSSA Bacteremia: Blood Cx 7/7 (+) MSSA sensitive to oxacillin. Blood cultures X 2 were repeated 7/9 with NGTD. ID consulted recommend line holiday and continue cefazolin. Small echodensity noted on TV, plan for TEE Thursday.  Lupus  Flare/Systemic Rash: Continue Prednisone New Onset HFrEF: ECHO 7/9 showed worsened EF 25-30%, G2DD, moderate pleural effusion-no evidence of cardiac tamponade, and a small echodensity on TV. Cardiology following.  ESRD - on HD TTS. IR removed TDC this AM, appreciate their assistance, tentative plan to have replaced on Thursday followed by HD.  Hypertension/volume  - Blood pressure remains elevated.Additional meds added by cardiology. Lower extremity edema with faint crackles on exam.  Reiterated the importance of fluid restriction. Anemia of CKD - Hgb now 8.4-will resume Aranesp, not given with HD yesterday, change to be given Thursday.  Secondary Hyperparathyroidism - Corr Ca ok.  Phos elevated.  Continue VDRA and binders. Nutrition -  Albumin low 2.2-will add protein supplements. Renal diet w/fluid restrictions.  Pericardial effusion - noted on echo. Cardio following.  Jen Mow, PA-C [facility] 06/11/2021,3:31 PM  LOS: 3 days   Pt seen, examined and agree w A/P as above. [name]  MD 06/11/2021, 5:56 PM

## 2021-06-11 NOTE — Procedures (Signed)
Pt's rt IJ tunneled HD cath was removed in it's entirety without immediate complications. Gauze dressing applied to site. No immediate complications. Medication used- 1% lidocaine to skin/SQ tissue. EBL < 5 cc.

## 2021-06-11 NOTE — Progress Notes (Signed)
Pharmacy Antibiotic Note  Cathy [name] is a 40 y.o. female admitted on 06/08/2021 with MSSA bacteremia.  Pharmacy has been consulted for cefazolin dosing.  Patient has ESRD on HD with plan for catheter removal and line holiday.  Afebrile, WBC WNL.  Plan: Continue Ancef 1gm IV Q24H - change to 2gm IV qHD once schedule is stable F/u HD schedule/tolerance, clinical progress  Weight: 68 kg (149 lb 14.6 oz)  Temp (24hrs), Avg:97.9 F (36.6 C), Min:97.6 F (36.4 C), Max:98.4 F (36.9 C)  Recent Labs  Lab 06/06/21 1035 06/08/21 1430 06/08/21 1714 06/08/21 1923 06/09/21 0356 06/10/21 0158 06/11/21 0208  WBC 5.7 6.7  --  6.6 5.7 4.4 8.1  CREATININE 9.88* 9.90*  --  9.38* 9.58* 9.52* 3.28*  LATICACIDVEN 0.8 0.8 1.0  --   --   --   --      Estimated Creatinine Clearance: 22.8 mL/min (A) (by C-G formula based on SCr of 3.28 mg/dL (H)).    No Known Allergies  Ancef 7/9 >>   7/7 BCx - MSSA 7/9 BCx - NGTD  Trinidy Masterson D. Mina Marble, PharmD, BCPS, [place] 06/11/2021, 8:39 AM

## 2021-06-11 NOTE — Plan of Care (Signed)

## 2021-06-11 NOTE — Progress Notes (Addendum)
Progress Note  Patient Name: Cathy [name] Date of Encounter: 06/11/2021  [facility] Cardiologist: Kirk Ruths, MD   Subjective   No chest pain or palpitations. Has chronic dyspnea.   Inpatient Medications    Scheduled Meds:  (feeding supplement) PROSource Plus  30 mL Oral TID BM   calcium acetate  1,334 mg Oral TID WC   carvedilol  12.5 mg Oral BID WC   Chlorhexidine Gluconate Cloth  6 each Topical Q0600   Chlorhexidine Gluconate Cloth  6 each Topical Q0600   clobetasol cream   Topical BID   cloNIDine  0.3 mg Oral TID   darbepoetin (ARANESP) injection - DIALYSIS  150 mcg Intravenous Q Mon-HD   doxercalciferol  5 mcg Intravenous Q M,W,F-HD   heparin  5,000 Units Subcutaneous Q8H   irbesartan  75 mg Oral Daily   pantoprazole  40 mg Oral Daily   predniSONE  40 mg Oral Q breakfast   torsemide  60 mg Oral BID   triamcinolone   Topical BID   Continuous Infusions:   ceFAZolin (ANCEF) IV 1 g (06/10/21 2345)   PRN Meds: acetaminophen **OR** acetaminophen, albuterol, alum & mag hydroxide-simeth, hydrALAZINE, lidocaine, ondansetron **OR** ondansetron (ZOFRAN) IV, oxyCODONE, polyethylene glycol   Vital Signs    Vitals:   06/10/21 2145 06/10/21 2230 06/10/21 2257 06/11/21 0741  BP: (!) 191/137 (!) 173/130 (!) 183/135 (!) 153/129  Pulse: 80 79 77 70  Resp: (!) '24 18 17 18  '$ Temp: (P) 98.4 F (36.9 C) 97.6 F (36.4 C) 97.7 F (36.5 C) 98.3 F (36.8 C)  TempSrc: (P) Oral Oral Oral Oral  SpO2: 100% 100% 100% 100%  Weight:        Intake/Output Summary (Last 24 hours) at 06/11/2021 1022 Last data filed at 06/10/2021 2145 Gross per 24 hour  Intake 290 ml  Output 2000 ml  Net -1710 ml   Last 3 Weights 06/10/2021 06/10/2021 06/06/2021  Weight (lbs) 149 lb 14.6 oz 146 lb 1.6 oz 147 lb 0.8 oz  Weight (kg) 68 kg 66.271 kg 66.7 kg      Telemetry    Not on tele  ECG    N/A  Physical Exam   GEN: No acute distress with erythematous rash  Neck: No JVD Cardiac:  RRR, no murmurs, rubs, or gallops.  Respiratory: Clear to auscultation bilaterally. GI: Soft, nontender, non-distended  MS: Trace edema; No deformity. Neuro:  Nonfocal  Psych: Normal affect   Labs     Chemistry Recent Labs  Lab 06/06/21 1035 06/08/21 1430 06/08/21 1923 06/09/21 0356 06/10/21 0158 06/11/21 0208  NA 140 139  --  138 137 136  K 3.4* 3.4*  --  4.0 4.2 3.3*  CL 104 102  --  102 101 97*  CO2 27 25  --  '23 24 30  '$ GLUCOSE 98 87  --  168* 131* 106*  BUN 29* 38*  --  40* 52* 14  CREATININE 9.88* 9.90*   < > 9.58* 9.52* 3.28*  CALCIUM 7.5* 7.4*  --  7.6* 7.5* 8.0*  PROT 5.3* 5.8*  --  5.9*  --   --   ALBUMIN 2.1* 2.3*  --  2.2* 2.1*  --   AST 24 31  --  37  --   --   ALT 12 16  --  17  --   --   ALKPHOS 140* 156*  --  199*  --   --   BILITOT 1.1 0.6  --  0.6  --   --   GFRNONAA 5* 5*   < > 5* 5* 18*  ANIONGAP 9 12  --  '13 12 9   '$ < > = values in this interval not displayed.     Hematology Recent Labs  Lab 06/09/21 0356 06/10/21 0158 06/11/21 0208  WBC 5.7 4.4 8.1  RBC 3.25* 3.24* 3.25*  HGB 8.2* 8.2* 8.4*  HCT 27.0* 26.6* 27.0*  MCV 83.1 82.1 83.1  MCH 25.2* 25.3* 25.8*  MCHC 30.4 30.8 31.1  RDW 20.9* 20.6* 20.6*  PLT 219 236 275    Radiology    No results found.  Cardiac Studies   Echo 06/08/21 1. Left ventricular ejection fraction, by estimation, is 25 to 30%. The  left ventricle has severely decreased function. The left ventricle  demonstrates global hypokinesis. There is mild concentric left ventricular  hypertrophy. Left ventricular diastolic   parameters are consistent with Grade II diastolic dysfunction  (pseudonormalization).   2. Right ventricular systolic function is normal. The right ventricular  size is normal. Mildly increased right ventricular wall thickness. There  is normal pulmonary artery systolic pressure.   3. Left atrial size was severely dilated.   4. Right atrial size was severely dilated.   5. Moderate pericardial  effusion. There is no evidence of cardiac  tamponade.   6. The mitral valve is grossly normal. Mild to moderate mitral valve  regurgitation.   7. The tricuspid valve is abnormal- small 0.44 echodensity seen on the  ventricular side of the tricuspid valve (Image 19/85. Tricuspid valve  regurgitation is mild to moderate.   8. The aortic valve is tricuspid. Aortic valve regurgitation is mild. No  aortic stenosis is present.   Comparison(s): A prior study was performed on 11/15/2020. Prior images  reviewed side by side. LVEF is significantly worse. Small echo density on  tricuspid valve was only seen in PSAX axis (not well visualized in prior  study).   Conclusion(s)/Recommendation(s): TEE could be considered given the  clinical question of endocarditis. Addendum: Primary team updated.   Patient Profile     40 y.o. female with a hx of ESRD on HD TTS, lupus, anxiety, asthma, essential hypertension, GERD presented to [facility] emergency room 7/7 for face extremity rash with fever.  Patient was diagnosed with lupus and discharged on doxycycline.  Cultures came back positive and advised to came on 7/9. ECHO from 7/9 showed global LV dysfunction, visual EF looks worse 20%, estimated 25-30%. Seem by cardiology fellow (Dr. Rudi Rummage) who recommended medical therapy with carvedilol and olmesartan.  Recommended outpatient follow-up for cardiac work-up.  Cardiology seeing the patient again for CHF management.    Assessment & Plan    Acute systolic and diastolic CHF -Echocardiogram this admission showed LV function of 25 to 30% and grade 2 diastolic dysfunction.  There is mild to moderate tricuspid regurgitation with density concerning for endocarditis.  EF was 55 to 60% on December 16th 2021.  - Not grossly volume overloaded on exam  -Continue carvedilol 12.5 mg twice daily, Avapro 75 mg daily torsemide 60 mg twice daily.  Also getting dialysis for volume management. -We will review ischemic  evaluation plan with MD  2.  Pericardial effusion -Moderate by echocardiogram this admission -No evidence of tamponade -Blood pressure elevated -Likely due to steroid use in setting of lupus flareup  3.  Small echodensity on tricuspid valve -Mild to moderate TR by echo with density -Plan for TEE on Thursday After careful review of  history and examination, the risks and benefits of transesophageal echocardiogram have been explained including risks of esophageal damage, perforation (1:10,000 risk), bleeding, pharyngeal hematoma as well as other potential complications associated with conscious sedation including aspiration, arrhythmia, respiratory failure and death. Alternatives to treatment were discussed, questions were answered. Patient is willing to proceed.  TEE - Dr. Johnsie Cancel 06/13/21 @ 1400.  NPO after midnight. Meds with sips.    4. MSSA Bacteremia - S/p right IJ tunneled HD cath removal today  - Per ID  5. ESRD on HD - Per nephrology   For questions or updates, please contact [place] Please consult [email or web address] for contact info under        Signed, Leanor Kail, PA  06/11/2021, 10:22 AM     History and all data above reviewed.  Patient examined.  I agree with the findings as above.  The patient is very somnolent and falling asleep during the interview.  She does not report SOB, PND or orthopnea.  She reports that prior to presentation she was at home with her 3 children and doing OK.  The patient denies any new symptoms such as chest discomfort, neck or arm discomfort. There has been no new shortness of breath, PND or orthopnea. There have been no reported palpitations, presyncope or syncope. Now found to have a newly reduced EF and bacteremia The patient exam reveals COR:RRR  ,  Lungs: Decreased breath sounds without crackles  ,  Abd: Positive bowel sounds, no rebound no guarding., Ext Trace edema.   .  All available labs, radiology testing, previous records reviewed.  Agree with documented assessment and plan.   Bacteremia: Discussed the procedure again with the patient for TEE and she has no questions.  She does not report difficulty swallowing.     CM:  I will suggest probably a non invasive work up as an out patient.  Agree with med titration.  Norvasc has been discontinued and we will continue to titrate ARB and beta blocker.  Volume is managed by dialysis.    Jeneen Rinks Mann Skaggs  1:27 PM  06/11/2021

## 2021-06-11 NOTE — Progress Notes (Signed)
PROGRESS NOTE  Cathy [name] [id number] DOB: [birth year] DOA: 06/08/2021 PCP: Pcp, No   LOS: 3 days   Brief Narrative / Interim history: 40 year old female with history of lupus, ESRD on HD TTS since January 2022, asthma, HTN, comes into the hospital after being called at home that her blood cultures were positive.  On 7/7 she went to dialysis, had a fever of 102, new onset rash to her face and legs and was sent to the ED.  She was diagnosed with a lupus flare, was placed on steroids as well as doxycycline and sent home.  Blood cultures were obtained during that ED visit, and returned positive for MSSA.  Patient was called and was admitted to the hospital on 7/9.  Tunneled central line was removed on 7/12 after receiving 2 sessions of hemodialysis  Subjective / 24h Interval events: Complains of ongoing shortness of breath.  No chest pain.  No nausea or vomiting.  Assessment & Plan: Principal Problem MSSA bacteremia, POA-patient not septic on admission, she is afebrile, no leukocytosis, not tachycardic nor tachypneic.  Initial blood cultures obtained on 7/7 in the ED visit finalized on 7/10 with MSSA sensitive to oxacillin.  She is currently on Ancef -Infectious disease following, recommending tunneled HD cath line holiday.  She received HD x2 on 7/11 early morning as well as late in the evening and HD cath was removed today 7/12.  She will need HD cath replaced on 7/14 after 48-hour line holiday -2D echo done 7/9 showed EF 25-30% with severely decreased LV function/global hypokinesis, grade 2 diastolic dysfunction.  RV was normal.  She had a moderate pericardial effusion without evidence of cardiac tamponade.  Her tricuspid valve showed a small 0.44 echodensity on the ventricular side of the tricuspid valve. I have contacted cardiology regarding her TEE, she is tentatively been scheduled for Friday but they are working to get her in sooner  Active Problems ESRD on HD TTS-she is followed by  Dr. Hollie Salk as an outpatient.  This is likely due to her lupus. She was hopeful for renal recovery and was somewhat resistant to having place an AV fistula but now scheduled with vascular surgery at the end of this month as an outpatient.  She is also interested in transplant follow-up with [place] nephrology follow-up   Lupus flare /systemic rash -she also has associated bilateral bilateral joint pains. She was given steroids in the ER, continue prednisone. -Sed rate/CRP elevated -Rash continues to improve with local and systemic steroids   New onset combined systolic and diastolic CHF /pericardial effusion-cardiology consulted and evaluated patient.  Her pericardial effusion is likely in the setting of lupus, she is on steroids. -Continue torsemide, will need ongoing HD for volume removal   Essential hypertension -blood pressure overall acceptable, she has some high readings   Asthma -complains of mild shortness of breath which I suspect is in the setting of fluid overload.  No wheezing.  Repeat chest x-ray today  Scheduled Meds:  (feeding supplement) PROSource Plus  30 mL Oral TID BM   calcium acetate  1,334 mg Oral TID WC   carvedilol  12.5 mg Oral BID WC   Chlorhexidine Gluconate Cloth  6 each Topical Q0600   Chlorhexidine Gluconate Cloth  6 each Topical Q0600   clobetasol cream   Topical BID   cloNIDine  0.3 mg Oral TID   darbepoetin (ARANESP) injection - DIALYSIS  150 mcg Intravenous Q Mon-HD   doxercalciferol  5 mcg Intravenous Q  M,W,F-HD   heparin  5,000 Units Subcutaneous Q8H   irbesartan  75 mg Oral Daily   pantoprazole  40 mg Oral Daily   predniSONE  40 mg Oral Q breakfast   torsemide  60 mg Oral BID   triamcinolone   Topical BID   Continuous Infusions:   ceFAZolin (ANCEF) IV 1 g (06/10/21 2345)   PRN Meds:.acetaminophen **OR** acetaminophen, albuterol, alum & mag hydroxide-simeth, hydrALAZINE, lidocaine, ondansetron **OR** ondansetron (ZOFRAN) IV,  oxyCODONE, polyethylene glycol  Diet Orders (From admission, onward)     Start     Ordered   06/08/21 1843  Diet renal with fluid restriction Fluid restriction: 1200 mL Fluid; Room service appropriate? Yes; Fluid consistency: Thin  Diet effective now       Question Answer Comment  Fluid restriction: 1200 mL Fluid   Room service appropriate? Yes   Fluid consistency: Thin      06/08/21 1842            DVT prophylaxis: heparin injection 5,000 Units Start: 06/08/21 2200     Code Status: Full Code  Family Communication: No family at bedside  Status is: Inpatient  Remains inpatient appropriate because:Inpatient level of care appropriate due to severity of illness  Dispo: The patient is from: Home              Anticipated d/c is to: Home              Patient currently is not medically stable to d/c.   Difficult to place patient No   Level of care: Med-Surg  Consultants:  Infectious disease Nephrology Cardiology  Procedures:  2D echo  Microbiology  Blood cultures 7/7-MSSA Blood culture 7/9-no growth to date  Antimicrobials: Cefazolin 7/9 >>  Objective: Vitals:   06/10/21 2145 06/10/21 2230 06/10/21 2257 06/11/21 0741  BP: (!) 191/137 (!) 173/130 (!) 183/135 (!) 153/129  Pulse: 80 79 77 70  Resp: (!) '24 18 17 18  '$ Temp: (P) 98.4 F (36.9 C) 97.6 F (36.4 C) 97.7 F (36.5 C) 98.3 F (36.8 C)  TempSrc: (P) Oral Oral Oral Oral  SpO2: 100% 100% 100% 100%  Weight:        Intake/Output Summary (Last 24 hours) at 06/11/2021 1020 Last data filed at 06/10/2021 2145 Gross per 24 hour  Intake 290 ml  Output 2000 ml  Net -1710 ml    Filed Weights   06/10/21 0530 06/10/21 1720  Weight: 66.3 kg 68 kg    Examination:  Constitutional: She is in no distress, sitting at the edge of the bed Eyes: No scleral icterus ENMT: Moist mucous membranes Neck: normal, supple Respiratory: Lungs are clear without wheezing appreciated.  Normal respiratory  effort, Cardiovascular: Regular rate and rhythm, no murmurs, trace edema Abdomen: Positive bowel sounds Musculoskeletal: no clubbing / cyanosis.  Skin: Erythematous raised rash on malar surfaces, nose, upper lip, on the forehead as well as lower extremities with few small ulcerated lesions on various stages of healing on bilateral lower extremities.  No mucosal involvement noted.  Continues to improve, less raised Neurologic: Nonfocal  Data Reviewed: I have independently reviewed following labs and imaging studies   CBC: Recent Labs  Lab 06/06/21 1035 06/08/21 1430 06/08/21 1923 06/09/21 0356 06/10/21 0158 06/11/21 0208  WBC 5.7 6.7 6.6 5.7 4.4 8.1  NEUTROABS 4.9 4.6  --   --  3.2  --   HGB 7.8* 8.1* 8.7* 8.2* 8.2* 8.4*  HCT 26.1* 27.5* 29.5* 27.0* 26.6* 27.0*  MCV  84.7 84.4 83.6 83.1 82.1 83.1  PLT 200 233 248 219 236 [id number]    Basic Metabolic Panel: Recent Labs  Lab 06/06/21 1035 06/08/21 1430 06/08/21 1923 06/09/21 0356 06/10/21 0158 06/11/21 0208  NA 140 139  --  138 137 136  K 3.4* 3.4*  --  4.0 4.2 3.3*  CL 104 102  --  102 101 97*  CO2 27 25  --  '23 24 30  '$ GLUCOSE 98 87  --  168* 131* 106*  BUN 29* 38*  --  40* 52* 14  CREATININE 9.88* 9.90* 9.38* 9.58* 9.52* 3.28*  CALCIUM 7.5* 7.4*  --  7.6* 7.5* 8.0*  MG  --   --   --  1.7  --   --   PHOS  --   --   --   --  6.0*  --     Liver Function Tests: Recent Labs  Lab 06/06/21 1035 06/08/21 1430 06/09/21 0356 06/10/21 0158  AST 24 31 37  --   ALT '12 16 17  '$ --   ALKPHOS 140* 156* 199*  --   BILITOT 1.1 0.6 0.6  --   PROT 5.3* 5.8* 5.9*  --   ALBUMIN 2.1* 2.3* 2.2* 2.1*    Coagulation Profile: Recent Labs  Lab 06/06/21 1035 06/08/21 1923  INR 1.1 1.0    HbA1C: No results for input(s): HGBA1C in the last 72 hours. CBG: No results for input(s): GLUCAP in the last 168 hours.  Recent Results (from the past 240 hour(s))  Culture, blood (Routine X 2) w Reflex to ID Panel     Status: Abnormal    Collection Time: 06/06/21 10:30 AM   Specimen: BLOOD LEFT FOREARM  Result Value Ref Range Status   Specimen Description BLOOD LEFT FOREARM  Final   Special Requests   Final    BOTTLES DRAWN AEROBIC AND ANAEROBIC Blood Culture adequate volume   Culture  Setup Time   Final    GRAM POSITIVE COCCI IN CLUSTERS ANAEROBIC BOTTLE ONLY CRITICAL RESULT CALLED TO, READ BACK BY AND VERIFIED WITH: RN [facility] 1111 [id number] FCP Performed at [facility], [place] [address] [place], [place] [address]    Culture STAPHYLOCOCCUS AUREUS (A)  Final   Report Status 06/09/2021 FINAL  Final   Organism ID, Bacteria STAPHYLOCOCCUS AUREUS  Final      Susceptibility   Staphylococcus aureus - MIC*    CIPROFLOXACIN <=0.5 SENSITIVE Sensitive     ERYTHROMYCIN <=0.25 SENSITIVE Sensitive     GENTAMICIN <=0.5 SENSITIVE Sensitive     OXACILLIN 0.5 SENSITIVE Sensitive     TETRACYCLINE <=1 SENSITIVE Sensitive     VANCOMYCIN 1 SENSITIVE Sensitive     TRIMETH/SULFA <=10 SENSITIVE Sensitive     CLINDAMYCIN <=0.25 SENSITIVE Sensitive     RIFAMPIN <=0.5 SENSITIVE Sensitive     Inducible Clindamycin NEGATIVE Sensitive     * STAPHYLOCOCCUS AUREUS  Blood Culture ID Panel (Reflexed)     Status: Abnormal   Collection Time: 06/06/21 10:30 AM  Result Value Ref Range Status   Enterococcus faecalis NOT DETECTED NOT DETECTED Final   Enterococcus Faecium NOT DETECTED NOT DETECTED Final   Listeria monocytogenes NOT DETECTED NOT DETECTED Final   Staphylococcus species DETECTED (A) NOT DETECTED Final    Comment: CRITICAL RESULT CALLED TO, READ BACK BY AND VERIFIED WITH: RN [facility] 1111 [id number] FCP    Staphylococcus aureus (BCID) DETECTED (A) NOT DETECTED Final    Comment: CRITICAL RESULT CALLED TO,  READ BACK BY AND VERIFIED WITH: RN [facility] 1111 [id number] FCP    Staphylococcus epidermidis NOT DETECTED NOT DETECTED Final   Staphylococcus lugdunensis NOT DETECTED NOT DETECTED Final   Streptococcus species NOT DETECTED NOT DETECTED Final    Streptococcus agalactiae NOT DETECTED NOT DETECTED Final   Streptococcus pneumoniae NOT DETECTED NOT DETECTED Final   Streptococcus pyogenes NOT DETECTED NOT DETECTED Final   A.calcoaceticus-baumannii NOT DETECTED NOT DETECTED Final   Bacteroides fragilis NOT DETECTED NOT DETECTED Final   Enterobacterales NOT DETECTED NOT DETECTED Final   Enterobacter cloacae complex NOT DETECTED NOT DETECTED Final   Escherichia coli NOT DETECTED NOT DETECTED Final   Klebsiella aerogenes NOT DETECTED NOT DETECTED Final   Klebsiella oxytoca NOT DETECTED NOT DETECTED Final   Klebsiella pneumoniae NOT DETECTED NOT DETECTED Final   Proteus species NOT DETECTED NOT DETECTED Final   Salmonella species NOT DETECTED NOT DETECTED Final   Serratia marcescens NOT DETECTED NOT DETECTED Final   Haemophilus influenzae NOT DETECTED NOT DETECTED Final   Neisseria meningitidis NOT DETECTED NOT DETECTED Final   Pseudomonas aeruginosa NOT DETECTED NOT DETECTED Final   Stenotrophomonas maltophilia NOT DETECTED NOT DETECTED Final   Candida albicans NOT DETECTED NOT DETECTED Final   Candida auris NOT DETECTED NOT DETECTED Final   Candida glabrata NOT DETECTED NOT DETECTED Final   Candida krusei NOT DETECTED NOT DETECTED Final   Candida parapsilosis NOT DETECTED NOT DETECTED Final   Candida tropicalis NOT DETECTED NOT DETECTED Final   Cryptococcus neoformans/gattii NOT DETECTED NOT DETECTED Final   Meth resistant mecA/C and MREJ NOT DETECTED NOT DETECTED Final    Comment: Performed at [facility], [address] [place], [place] [address]  Blood culture (routine single)     Status: None   Collection Time: 06/06/21 10:35 AM   Specimen: BLOOD  Result Value Ref Range Status   Specimen Description BLOOD RIGHT ANTECUBITAL  Final   Special Requests   Final    BOTTLES DRAWN AEROBIC AND ANAEROBIC Blood Culture results may not be optimal due to an excessive volume of blood received in culture bottles   Culture   Final     NO GROWTH 5 DAYS Performed at [facility], [place] [address] [place], [place] [address]    Report Status 06/11/2021 FINAL  Final  Culture, blood (routine x 2)     Status: None (Preliminary result)   Collection Time: 06/08/21  4:29 PM   Specimen: Site Not Specified; Blood  Result Value Ref Range Status   Specimen Description SITE NOT SPECIFIED  Final   Special Requests   Final    BOTTLES DRAWN AEROBIC AND ANAEROBIC Blood Culture adequate volume   Culture   Final    NO GROWTH 3 DAYS Performed at [facility], [place] [address] [place], [place] [address]    Report Status PENDING  Incomplete  Culture, blood (routine x 2)     Status: None (Preliminary result)   Collection Time: 06/08/21  5:00 PM   Specimen: BLOOD RIGHT FOREARM  Result Value Ref Range Status   Specimen Description BLOOD RIGHT FOREARM  Final   Special Requests   Final    BOTTLES DRAWN AEROBIC AND ANAEROBIC Blood Culture adequate volume   Culture   Final    NO GROWTH 3 DAYS Performed at [facility], [place] [address] [place],  [address]    Report Status PENDING  Incomplete  Resp Panel by RT-PCR (Flu A&B, Covid)  Nasopharyngeal Swab     Status: None   Collection Time: 06/08/21  5:35 PM   Specimen: Nasopharyngeal Swab; Nasopharyngeal(NP) swabs in vial transport medium  Result Value Ref Range Status   SARS Coronavirus 2 by RT PCR NEGATIVE NEGATIVE Final    Comment: (NOTE) SARS-CoV-2 target nucleic acids are NOT DETECTED.  The SARS-CoV-2 RNA is generally detectable in upper respiratory specimens during the acute phase of infection. The lowest concentration of SARS-CoV-2 viral copies this assay can detect is 138 copies/mL. A negative result does not preclude SARS-Cov-2 infection and should not be used as the sole basis for treatment or other patient management decisions. A negative result may occur with  improper specimen collection/handling, submission of specimen other than nasopharyngeal swab,  presence of viral mutation(s) within the areas targeted by this assay, and inadequate number of viral copies(<138 copies/mL). A negative result must be combined with clinical observations, patient history, and epidemiological information. The expected result is Negative.  Fact Sheet for Patients:  EntrepreneurPulse.com.au  Fact Sheet for Healthcare Providers:  IncredibleEmployment.be  This test is no t yet approved or cleared by the [place] FDA and  has been authorized for detection and/or diagnosis of SARS-CoV-2 by FDA under an Emergency Use Authorization (EUA). This EUA will remain  in effect (meaning this test can be used) for the duration of the COVID-19 declaration under Section 564(b)(1) of the Act, 21 U.S.C.section 360bbb-3(b)(1), unless the authorization is terminated  or revoked sooner.       Influenza A by PCR NEGATIVE NEGATIVE Final   Influenza B by PCR NEGATIVE NEGATIVE Final    Comment: (NOTE) The Xpert Xpress SARS-CoV-2/FLU/RSV plus assay is intended as an aid in the diagnosis of influenza from Nasopharyngeal swab specimens and should not be used as a sole basis for treatment. Nasal washings and aspirates are unacceptable for Xpert Xpress SARS-CoV-2/FLU/RSV testing.  Fact Sheet for Patients: EntrepreneurPulse.com.au  Fact Sheet for Healthcare Providers: IncredibleEmployment.be  This test is not yet approved or cleared by the [place] FDA and has been authorized for detection and/or diagnosis of SARS-CoV-2 by FDA under an Emergency Use Authorization (EUA). This EUA will remain in effect (meaning this test can be used) for the duration of the COVID-19 declaration under Section 564(b)(1) of the Act, 21 U.S.C. section 360bbb-3(b)(1), unless the authorization is terminated or revoked.  Performed at [facility], [place] [address], [place], [place] [address]       Radiology  Studies: No results found.  Marzetta Board, MD, PhD [facility]  Between 7 am - 7 pm I am available, please contact me via Amion (for emergencies) or Securechat (non urgent messages)  Between 7 pm - 7 am I am not available, please contact night coverage MD/APP via Amion

## 2021-06-12 DIAGNOSIS — N186 End stage renal disease: Secondary | ICD-10-CM | POA: Diagnosis not present

## 2021-06-12 DIAGNOSIS — I5020 Unspecified systolic (congestive) heart failure: Secondary | ICD-10-CM | POA: Diagnosis not present

## 2021-06-12 DIAGNOSIS — A4101 Sepsis due to Methicillin susceptible Staphylococcus aureus: Secondary | ICD-10-CM | POA: Diagnosis not present

## 2021-06-12 DIAGNOSIS — M3212 Pericarditis in systemic lupus erythematosus: Secondary | ICD-10-CM | POA: Diagnosis not present

## 2021-06-12 LAB — CBC
HCT: 27.6 % — ABNORMAL LOW (ref 36.0–46.0)
Hemoglobin: 8.2 g/dL — ABNORMAL LOW (ref 12.0–15.0)
MCH: 24.8 pg — ABNORMAL LOW (ref 26.0–34.0)
MCHC: 29.7 g/dL — ABNORMAL LOW (ref 30.0–36.0)
MCV: 83.4 fL (ref 80.0–100.0)
Platelets: 300 10*3/uL (ref 150–400)
RBC: 3.31 MIL/uL — ABNORMAL LOW (ref 3.87–5.11)
RDW: 21.1 % — ABNORMAL HIGH (ref 11.5–15.5)
WBC: 6.7 10*3/uL (ref 4.0–10.5)
nRBC: 0.4 % — ABNORMAL HIGH (ref 0.0–0.2)

## 2021-06-12 LAB — BASIC METABOLIC PANEL
Anion gap: 10 (ref 5–15)
BUN: 29 mg/dL — ABNORMAL HIGH (ref 6–20)
CO2: 27 mmol/L (ref 22–32)
Calcium: 8.3 mg/dL — ABNORMAL LOW (ref 8.9–10.3)
Chloride: 99 mmol/L (ref 98–111)
Creatinine, Ser: 4.65 mg/dL — ABNORMAL HIGH (ref 0.44–1.00)
GFR, Estimated: 12 mL/min — ABNORMAL LOW (ref >60)
Glucose, Bld: 116 mg/dL — ABNORMAL HIGH (ref 70–99)
Potassium: 4.3 mmol/L (ref 3.5–5.1)
Sodium: 136 mmol/L (ref 135–145)

## 2021-06-12 MED ORDER — CLONIDINE HCL 0.2 MG PO TABS
0.2000 mg | ORAL_TABLET | Freq: Three times a day (TID) | ORAL | Status: DC
  Administered 2021-06-12 – 2021-06-14 (×6): 0.2 mg via ORAL
  Filled 2021-06-12 (×6): qty 1

## 2021-06-12 MED ORDER — IRBESARTAN 150 MG PO TABS
150.0000 mg | ORAL_TABLET | Freq: Every day | ORAL | Status: DC
  Administered 2021-06-12 – 2021-06-14 (×3): 150 mg via ORAL
  Filled 2021-06-12 (×3): qty 1

## 2021-06-12 MED ORDER — CHLORHEXIDINE GLUCONATE CLOTH 2 % EX PADS
6.0000 | MEDICATED_PAD | Freq: Every day | CUTANEOUS | Status: DC
  Administered 2021-06-13 – 2021-06-14 (×2): 6 via TOPICAL

## 2021-06-12 MED ORDER — ISOSORB DINITRATE-HYDRALAZINE 20-37.5 MG PO TABS
1.0000 | ORAL_TABLET | Freq: Three times a day (TID) | ORAL | Status: DC
  Administered 2021-06-12 – 2021-06-13 (×4): 1 via ORAL
  Filled 2021-06-12 (×4): qty 1

## 2021-06-12 MED ORDER — ISOSORBIDE MONONITRATE ER 60 MG PO TB24: 60.0000 mg | ORAL_TABLET | Freq: Every day | ORAL | Status: DC

## 2021-06-12 NOTE — Progress Notes (Signed)
PROGRESS NOTE    Cathy [name]  [id number] DOB: 09-Sep-1981 DOA: 06/08/2021 PCP: Pcp, No    Brief Narrative:  40 year old female with history of lupus, ESRD on HD TTS since January 2022, asthma, HTN, comes into the hospital after being called at home that her blood cultures were positive.  On 7/7 she went to dialysis, had a fever of 102, new onset rash to her face and legs and was sent to the ED.  She was diagnosed with a lupus flare, was placed on steroids as well as doxycycline and sent home.  Blood cultures were obtained during that ED visit, and returned positive for MSSA.  Patient was called and was admitted to the hospital on 7/9.  Tunneled central line was removed on 7/12 after receiving 2 sessions of hemodialysis  7/13.  No new complaints.  Still complaining of shortness of breath.  No chest pain, nausea, abdominal pain  Consultants:  ID, cardiology, nephrology  Procedures:   Antimicrobials:  Cefazolin   Subjective: Still with chest pain  Objective: Vitals:   06/11/21 1337 06/11/21 1611 06/11/21 2209 06/12/21 0731  BP: (!) 156/128 (!) 164/131 (!) 176/129 (!) 164/136  Pulse: 73 75 74 69  Resp:  '18 18 18  '$ Temp:  97.8 F (36.6 C) 98 F (36.7 C) 97.7 F (36.5 C)  TempSrc:  Oral Oral Oral  SpO2:  100% 100% 100%  Weight:       No intake or output data in the 24 hours ending 06/12/21 0758 Filed Weights   06/10/21 0530 06/10/21 1720  Weight: 66.3 kg 68 kg    Examination:  General exam: Appears calm and comfortable  Respiratory system: Mild rhonchi, no wheezing Cardiovascular system: S1 & S2 heard, RRR.  No gallops  Gastrointestinal system: Abdomen is nondistended, soft and nontender.  Normal bowel sounds heard. Central nervous system: Alert and oriented.  Grossly intact Extremities: No edema Skin: Warm dry Psychiatry: Mood & affect appropriate in current setting.     Data Reviewed: I have personally reviewed following labs and imaging  studies  CBC: Recent Labs  Lab 06/06/21 1035 06/08/21 1430 06/08/21 1923 06/09/21 0356 06/10/21 0158 06/11/21 0208 06/12/21 0327  WBC 5.7 6.7 6.6 5.7 4.4 8.1 6.7  NEUTROABS 4.9 4.6  --   --  3.2  --   --   HGB 7.8* 8.1* 8.7* 8.2* 8.2* 8.4* 8.2*  HCT 26.1* 27.5* 29.5* 27.0* 26.6* 27.0* 27.6*  MCV 84.7 84.4 83.6 83.1 82.1 83.1 83.4  PLT 200 233 248 219 236 275 [id number]   Basic Metabolic Panel: Recent Labs  Lab 06/08/21 1430 06/08/21 1923 06/09/21 0356 06/10/21 0158 06/11/21 0208 06/12/21 0327  NA 139  --  138 137 136 136  K 3.4*  --  4.0 4.2 3.3* 4.3  CL 102  --  102 101 97* 99  CO2 25  --  '23 24 30 27  '$ GLUCOSE 87  --  168* 131* 106* 116*  BUN 38*  --  40* 52* 14 29*  CREATININE 9.90* 9.38* 9.58* 9.52* 3.28* 4.65*  CALCIUM 7.4*  --  7.6* 7.5* 8.0* 8.3*  MG  --   --  1.7  --   --   --   PHOS  --   --   --  6.0*  --   --    GFR: Estimated Creatinine Clearance: 16.1 mL/min (A) (by C-G formula based on SCr of 4.65 mg/dL (H)). Liver Function Tests: Recent Labs  Lab 06/06/21 1035  06/08/21 1430 06/09/21 0356 06/10/21 0158  AST 24 31 37  --   ALT '12 16 17  '$ --   ALKPHOS 140* 156* 199*  --   BILITOT 1.1 0.6 0.6  --   PROT 5.3* 5.8* 5.9*  --   ALBUMIN 2.1* 2.3* 2.2* 2.1*   No results for input(s): LIPASE, AMYLASE in the last 168 hours. No results for input(s): AMMONIA in the last 168 hours. Coagulation Profile: Recent Labs  Lab 06/06/21 1035 06/08/21 1923  INR 1.1 1.0   Cardiac Enzymes: No results for input(s): CKTOTAL, CKMB, CKMBINDEX, TROPONINI in the last 168 hours. BNP (last 3 results) No results for input(s): PROBNP in the last 8760 hours. HbA1C: No results for input(s): HGBA1C in the last 72 hours. CBG: No results for input(s): GLUCAP in the last 168 hours. Lipid Profile: No results for input(s): CHOL, HDL, LDLCALC, TRIG, CHOLHDL, LDLDIRECT in the last 72 hours. Thyroid Function Tests: No results for input(s): TSH, T4TOTAL, FREET4, T3FREE, THYROIDAB in  the last 72 hours. Anemia Panel: Recent Labs    06/09/21 1547 06/10/21 0158  FERRITIN  --  1,224*  TIBC 210*  --   IRON 56  --    Sepsis Labs: Recent Labs  Lab 06/06/21 1035 06/08/21 1430 06/08/21 1714  LATICACIDVEN 0.8 0.8 1.0    Recent Results (from the past 240 hour(s))  Culture, blood (Routine X 2) w Reflex to ID Panel     Status: Abnormal   Collection Time: 06/06/21 10:30 AM   Specimen: BLOOD LEFT FOREARM  Result Value Ref Range Status   Specimen Description BLOOD LEFT FOREARM  Final   Special Requests   Final    BOTTLES DRAWN AEROBIC AND ANAEROBIC Blood Culture adequate volume   Culture  Setup Time   Final    GRAM POSITIVE COCCI IN CLUSTERS ANAEROBIC BOTTLE ONLY CRITICAL RESULT CALLED TO, READ BACK BY AND VERIFIED WITH: RN SAM 1111 [id number] FCP Performed at [facility], [place] [address] [place], [place] [address]    Culture STAPHYLOCOCCUS AUREUS (A)  Final   Report Status 06/09/2021 FINAL  Final   Organism ID, Bacteria STAPHYLOCOCCUS AUREUS  Final      Susceptibility   Staphylococcus aureus - MIC*    CIPROFLOXACIN <=0.5 SENSITIVE Sensitive     ERYTHROMYCIN <=0.25 SENSITIVE Sensitive     GENTAMICIN <=0.5 SENSITIVE Sensitive     OXACILLIN 0.5 SENSITIVE Sensitive     TETRACYCLINE <=1 SENSITIVE Sensitive     VANCOMYCIN 1 SENSITIVE Sensitive     TRIMETH/SULFA <=10 SENSITIVE Sensitive     CLINDAMYCIN <=0.25 SENSITIVE Sensitive     RIFAMPIN <=0.5 SENSITIVE Sensitive     Inducible Clindamycin NEGATIVE Sensitive     * STAPHYLOCOCCUS AUREUS  Blood Culture ID Panel (Reflexed)     Status: Abnormal   Collection Time: 06/06/21 10:30 AM  Result Value Ref Range Status   Enterococcus faecalis NOT DETECTED NOT DETECTED Final   Enterococcus Faecium NOT DETECTED NOT DETECTED Final   Listeria monocytogenes NOT DETECTED NOT DETECTED Final   Staphylococcus species DETECTED (A) NOT DETECTED Final    Comment: CRITICAL RESULT CALLED TO, READ BACK BY AND VERIFIED WITH: RN SAM  1111 [id number] FCP    Staphylococcus aureus (BCID) DETECTED (A) NOT DETECTED Final    Comment: CRITICAL RESULT CALLED TO, READ BACK BY AND VERIFIED WITH: RN SAM 1111 [id number] FCP    Staphylococcus epidermidis NOT DETECTED NOT DETECTED Final   Staphylococcus lugdunensis NOT DETECTED NOT DETECTED  Final   Streptococcus species NOT DETECTED NOT DETECTED Final   Streptococcus agalactiae NOT DETECTED NOT DETECTED Final   Streptococcus pneumoniae NOT DETECTED NOT DETECTED Final   Streptococcus pyogenes NOT DETECTED NOT DETECTED Final   A.calcoaceticus-baumannii NOT DETECTED NOT DETECTED Final   Bacteroides fragilis NOT DETECTED NOT DETECTED Final   Enterobacterales NOT DETECTED NOT DETECTED Final   Enterobacter cloacae complex NOT DETECTED NOT DETECTED Final   Escherichia coli NOT DETECTED NOT DETECTED Final   Klebsiella aerogenes NOT DETECTED NOT DETECTED Final   Klebsiella oxytoca NOT DETECTED NOT DETECTED Final   Klebsiella pneumoniae NOT DETECTED NOT DETECTED Final   Proteus species NOT DETECTED NOT DETECTED Final   Salmonella species NOT DETECTED NOT DETECTED Final   Serratia marcescens NOT DETECTED NOT DETECTED Final   Haemophilus influenzae NOT DETECTED NOT DETECTED Final   Neisseria meningitidis NOT DETECTED NOT DETECTED Final   Pseudomonas aeruginosa NOT DETECTED NOT DETECTED Final   Stenotrophomonas maltophilia NOT DETECTED NOT DETECTED Final   Candida albicans NOT DETECTED NOT DETECTED Final   Candida auris NOT DETECTED NOT DETECTED Final   Candida glabrata NOT DETECTED NOT DETECTED Final   Candida krusei NOT DETECTED NOT DETECTED Final   Candida parapsilosis NOT DETECTED NOT DETECTED Final   Candida tropicalis NOT DETECTED NOT DETECTED Final   Cryptococcus neoformans/gattii NOT DETECTED NOT DETECTED Final   Meth resistant mecA/C and MREJ NOT DETECTED NOT DETECTED Final    Comment: Performed at [facility], [address], [place], [place] [address]  Blood culture (routine  single)     Status: None   Collection Time: 06/06/21 10:35 AM   Specimen: BLOOD  Result Value Ref Range Status   Specimen Description BLOOD RIGHT ANTECUBITAL  Final   Special Requests   Final    BOTTLES DRAWN AEROBIC AND ANAEROBIC Blood Culture results may not be optimal due to an excessive volume of blood received in culture bottles   Culture   Final    NO GROWTH 5 DAYS Performed at [facility], [place] [address] [place], [place] [address]    Report Status 06/11/2021 FINAL  Final  Culture, blood (routine x 2)     Status: None (Preliminary result)   Collection Time: 06/08/21  4:29 PM   Specimen: Site Not Specified; Blood  Result Value Ref Range Status   Specimen Description SITE NOT SPECIFIED  Final   Special Requests   Final    BOTTLES DRAWN AEROBIC AND ANAEROBIC Blood Culture adequate volume   Culture   Final    NO GROWTH 3 DAYS Performed at [facility], [place] [address], [place], [place] [address]    Report Status PENDING  Incomplete  Culture, blood (routine x 2)     Status: None (Preliminary result)   Collection Time: 06/08/21  5:00 PM   Specimen: BLOOD RIGHT FOREARM  Result Value Ref Range Status   Specimen Description BLOOD RIGHT FOREARM  Final   Special Requests   Final    BOTTLES DRAWN AEROBIC AND ANAEROBIC Blood Culture adequate volume   Culture   Final    NO GROWTH 3 DAYS Performed at [facility], [place] [address], [place], [place] [address]    Report Status PENDING  Incomplete  Resp Panel by RT-PCR (Flu A&B, Covid) Nasopharyngeal Swab     Status: None   Collection Time: 06/08/21  5:35 PM   Specimen: Nasopharyngeal Swab; Nasopharyngeal(NP) swabs in vial transport medium  Result Value  Ref Range Status   SARS Coronavirus 2 by RT PCR NEGATIVE NEGATIVE Final    Comment: (NOTE) SARS-CoV-2 target nucleic acids are NOT DETECTED.  The SARS-CoV-2 RNA is generally detectable in upper respiratory specimens during the acute phase of infection. The  lowest concentration of SARS-CoV-2 viral copies this assay can detect is 138 copies/mL. A negative result does not preclude SARS-Cov-2 infection and should not be used as the sole basis for treatment or other patient management decisions. A negative result may occur with  improper specimen collection/handling, submission of specimen other than nasopharyngeal swab, presence of viral mutation(s) within the areas targeted by this assay, and inadequate number of viral copies(<138 copies/mL). A negative result must be combined with clinical observations, patient history, and epidemiological information. The expected result is Negative.  Fact Sheet for Patients:  EntrepreneurPulse.com.au  Fact Sheet for Healthcare Providers:  IncredibleEmployment.be  This test is no t yet approved or cleared by the [place] FDA and  has been authorized for detection and/or diagnosis of SARS-CoV-2 by FDA under an Emergency Use Authorization (EUA). This EUA will remain  in effect (meaning this test can be used) for the duration of the COVID-19 declaration under Section 564(b)(1) of the Act, 21 U.S.C.section 360bbb-3(b)(1), unless the authorization is terminated  or revoked sooner.       Influenza A by PCR NEGATIVE NEGATIVE Final   Influenza B by PCR NEGATIVE NEGATIVE Final    Comment: (NOTE) The Xpert Xpress SARS-CoV-2/FLU/RSV plus assay is intended as an aid in the diagnosis of influenza from Nasopharyngeal swab specimens and should not be used as a sole basis for treatment. Nasal washings and aspirates are unacceptable for Xpert Xpress SARS-CoV-2/FLU/RSV testing.  Fact Sheet for Patients: EntrepreneurPulse.com.au  Fact Sheet for Healthcare Providers: IncredibleEmployment.be  This test is not yet approved or cleared by the [place] FDA and has been authorized for detection and/or diagnosis of SARS-CoV-2 by FDA under  an Emergency Use Authorization (EUA). This EUA will remain in effect (meaning this test can be used) for the duration of the COVID-19 declaration under Section 564(b)(1) of the Act, 21 U.S.C. section 360bbb-3(b)(1), unless the authorization is terminated or revoked.  Performed at [facility], [place] [address] [place], [place] [address]          Radiology Studies: IR Removal Tun Cv Cath W/O FL  Result Date: 06/11/2021 INDICATION: Patient with history of lupus nephritis and previously placed right internal jugular tunneled HD catheter on 11/30/2020; she now presents with staph bacteremia and request received for catheter removal EXAM: REMOVAL TUNNELED CENTRAL VENOUS CATHETER MEDICATIONS: % lidocaine to skin and subcutaneous tissue ANESTHESIA/SEDATION: None FLUOROSCOPY TIME:  None COMPLICATIONS: None immediate. PROCEDURE: Informed written consent was obtained from the patient after a thorough discussion of the procedural risks, benefits and alternatives. All questions were addressed. Maximal Sterile Barrier Technique was utilized including caps, mask, sterile gowns, sterile gloves, sterile drape, hand hygiene and skin antiseptic. A timeout was performed prior to the initiation of the procedure. The patient's right chest and catheter was prepped and draped in a normal sterile fashion. Heparin was removed from both ports of catheter. 1% lidocaine was used for local anesthesia. Using gentle blunt dissection the cuff of the catheter was exposed and the catheter was removed in it's entirety. Pressure was held till hemostasis was obtained. A sterile dressing was applied. The patient tolerated the procedure well with no immediate complications. IMPRESSION: Successful catheter removal as described above. Read by: Rowe Robert,  PA-C Electronically Signed   By: Corrie Mckusick D.O.   On: 06/11/2021 10:16   DG CHEST PORT 1 VIEW  Result Date: 06/11/2021 CLINICAL DATA:  Dyspnea.  Chest pain. EXAM: PORTABLE  CHEST 1 VIEW COMPARISON:  06/06/2021 FINDINGS: Normal heart size scratch set stable cardiomediastinal contours. Persistent bilateral pleural effusions, left greater than right. Mild interstitial edema. No airspace consolidation. IMPRESSION: Persistent bilateral pleural effusions, left greater than right. Mild interstitial edema. Electronically Signed   By: Kerby Moors M.D.   On: 06/11/2021 13:39        Scheduled Meds:  (feeding supplement) PROSource Plus  30 mL Oral TID BM   calcium acetate  1,334 mg Oral TID WC   carvedilol  12.5 mg Oral BID WC   Chlorhexidine Gluconate Cloth  6 each Topical Q0600   Chlorhexidine Gluconate Cloth  6 each Topical Q0600   clobetasol cream   Topical BID   cloNIDine  0.3 mg Oral TID   [START ON 06/13/2021] darbepoetin (ARANESP) injection - DIALYSIS  150 mcg Intravenous Q Thu-HD   doxercalciferol  5 mcg Intravenous Q M,W,F-HD   heparin  5,000 Units Subcutaneous Q8H   irbesartan  75 mg Oral Daily   pantoprazole  40 mg Oral Daily   predniSONE  40 mg Oral Q breakfast   torsemide  60 mg Oral BID   triamcinolone   Topical BID   Continuous Infusions:   ceFAZolin (ANCEF) IV 1 g (06/11/21 1657)    Assessment & Plan:   Principal Problem:   MSSA (methicillin susceptible Staphylococcus aureus) septicemia ([place]) Active Problems:   Asthma   MDD (major depressive disorder)   Lupus (systemic lupus erythematosus) (HCC)   Pleuritic chest pain   Diarrhea   ESRD (end stage renal disease) (HCC)   Benign essential HTN   HFrEF (heart failure with reduced ejection fraction) (HCC)   Pleural effusion   Anasarca associated with disorder of kidney   MSSA bacteremia, POA-patient not septic on admission, she is afebrile, no leukocytosis, not tachycardic nor tachypneic.  Initial blood cultures obtained on 7/7 in the ED visit finalized on 7/10 with MSSA sensitive to oxacillin.  She is currently on Ancef -Infectious disease following, recommending tunneled HD cath line  holiday.  She received HD x2 on 7/11 early morning as well as late in the evening and HD cath was removed today 7/12.  She will need HD cath replaced on 7/14 after 48-hour line holiday -2D echo done 7/9 showed EF 25-30% with severely decreased LV function/global hypokinesis, grade 2 diastolic dysfunction.  RV was normal.  She had a moderate pericardial effusion without evidence of cardiac tamponade.  Her tricuspid valve showed a small 0.44 echodensity on the ventricular side of the tricuspid valve.  7/13 cardiology following, they will do TEE tomorrow to rule out vegetation/evaluation of TV Follow-up blood cultures Continue cefazolin dosed for HD    Active Problems ESRD on HD TTS-she is followed by Dr. Hollie Salk as an outpatient.  This is likely due to her lupus. She was hopeful for renal recovery and was somewhat resistant to having place an AV fistula but now scheduled with vascular surgery at the end of this month as an outpatient.  She is also interested in transplant follow-up with [facility] 7/13 nephrology following. IR remove [facility] 7/12, nephrology will place order to have [facility] replaced tomorrow prior to TEE if possible.  Plan for dialysis to follow.   Lupus flare /systemic rash -she also has associated bilateral bilateral  joint pains.  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  Goal to try to maximize afterload reduction   Essential hypertension -blood pressure overall acceptable, she has some high readings     Asthma -complains of mild shortness of breath which I suspect is in the setting of fluid overload.  No wheezing.  Repeat chest x-ray today   DVT prophylaxis: Heparin Code Status: Full Family Communication: None at bedside Disposition Plan:  Status is: Inpatient  Remains inpatient appropriate because:Inpatient level of care appropriate due to severity of illness  Dispo: The patient is from: Home              Anticipated d/c is to: Home              Patient currently is not medically stable to d/c.   Difficult to place patient No            LOS: 4 days   Time spent: 35 minutes with more than 50% on [place], MD [facility] Pager [phone]  If 7PM-7AM, please contact night-coverage 06/12/2021, 7:58 AM

## 2021-06-12 NOTE — Progress Notes (Signed)
[facility] Infectious Disease  Date of Admission:  06/08/2021           Reason for visit: Follow up on MSSA bacteremia  Current antibiotics: Cefazolin  ASSESSMENT:    40 year old woman with lupus, ESRD, new onset heart failure, admitted with MSSA bacteremia.  TTE showed a small echodensity on the tricuspid valve and she is having a TEE tomorrow for further evaluation.  Repeat blood cultures 06/08/2021 remain no growth and her HD line was removed 06/11/2021.  PLAN:    Continue cefazolin dosed for HD Follow-up TEE Follow-up blood cultures Will follow   Principal Problem:   MSSA (methicillin susceptible Staphylococcus aureus) septicemia (HCC) Active Problems:   Asthma   MDD (major depressive disorder)   Lupus (systemic lupus erythematosus) (HCC)   Pleuritic chest pain   Diarrhea   ESRD (end stage renal disease) (HCC)   Benign essential HTN   HFrEF (heart failure with reduced ejection fraction) (HCC)   Pleural effusion   Anasarca associated with disorder of kidney    MEDICATIONS:    Scheduled Meds:  (feeding supplement) PROSource Plus  30 mL Oral TID BM   calcium acetate  1,334 mg Oral TID WC   carvedilol  12.5 mg Oral BID WC   Chlorhexidine Gluconate Cloth  6 each Topical Q0600   Chlorhexidine Gluconate Cloth  6 each Topical Q0600   clobetasol cream   Topical BID   cloNIDine  0.2 mg Oral TID   [START ON 06/13/2021] darbepoetin (ARANESP) injection - DIALYSIS  150 mcg Intravenous Q Thu-HD   doxercalciferol  5 mcg Intravenous Q M,W,F-HD   heparin  5,000 Units Subcutaneous Q8H   irbesartan  150 mg Oral Daily   isosorbide-hydrALAZINE  1 tablet Oral TID   pantoprazole  40 mg Oral Daily   predniSONE  40 mg Oral Q breakfast   torsemide  60 mg Oral BID   triamcinolone   Topical BID   Continuous Infusions:   ceFAZolin (ANCEF) IV 1 g (06/11/21 1657)   PRN Meds:.acetaminophen **OR** acetaminophen, albuterol, alum & mag hydroxide-simeth, hydrALAZINE, lidocaine,  ondansetron **OR** ondansetron (ZOFRAN) IV, oxyCODONE, polyethylene glycol  SUBJECTIVE:   24 hour events:  Status post HD line removal  No new complaints.  She reports some shortness of breath.  No chest pain.  No cough.  No fevers.  No chills.  Tolerating antibiotics.  Review of Systems  All other systems reviewed and are negative.    OBJECTIVE:   Blood pressure (!) 164/136, pulse 69, temperature 97.7 F (36.5 C), temperature source Oral, resp. rate 18, weight 68 kg, SpO2 100 %, unknown if currently breastfeeding. Body mass index is 23.48 kg/m.  Physical Exam Constitutional:      General: She is not in acute distress.    Appearance: Normal appearance.  HENT:     Head: Normocephalic and atraumatic.  Cardiovascular:     Rate and Rhythm: Normal rate and regular rhythm.     Heart sounds: Murmur heard.  Pulmonary:     Effort: Pulmonary effort is normal. No respiratory distress.     Breath sounds: Rhonchi present.  Abdominal:     Palpations: Abdomen is soft.     Tenderness: There is no abdominal tenderness.  Skin:    General: Skin is warm and dry.     Findings: Rash present.  Neurological:     General: No focal deficit present.     Mental Status: She is alert and oriented to  person, place, and time.  Psychiatric:        Mood and Affect: Mood normal.        Behavior: Behavior normal.     Lab Results: Lab Results  Component Value Date   WBC 6.7 06/12/2021   HGB 8.2 (L) 06/12/2021   HCT 27.6 (L) 06/12/2021   MCV 83.4 06/12/2021   PLT 300 06/12/2021    Lab Results  Component Value Date   NA 136 06/12/2021   K 4.3 06/12/2021   CO2 27 06/12/2021   GLUCOSE 116 (H) 06/12/2021   BUN 29 (H) 06/12/2021   CREATININE 4.65 (H) 06/12/2021   CALCIUM 8.3 (L) 06/12/2021   GFRNONAA 12 (L) 06/12/2021   GFRAA >60 08/07/2020    Lab Results  Component Value Date   ALT 17 06/09/2021   AST 37 06/09/2021   ALKPHOS 199 (H) 06/09/2021   BILITOT 0.6 06/09/2021        Component Value Date/Time   CRP 1.2 (H) 06/08/2021 1923   CRP 4 07/28/2018 1020       Component Value Date/Time   ESRSEDRATE 62 (H) 06/08/2021 1923     I have reviewed the micro and lab results in Epic.  Imaging: IR Removal Tun Cv Cath W/O FL  Result Date: 06/11/2021 INDICATION: Patient with history of lupus nephritis and previously placed right internal jugular tunneled HD catheter on 11/30/2020; she now presents with staph bacteremia and request received for catheter removal EXAM: REMOVAL TUNNELED CENTRAL VENOUS CATHETER MEDICATIONS: % lidocaine to skin and subcutaneous tissue ANESTHESIA/SEDATION: None FLUOROSCOPY TIME:  None COMPLICATIONS: None immediate. PROCEDURE: Informed written consent was obtained from the patient after a thorough discussion of the procedural risks, benefits and alternatives. All questions were addressed. Maximal Sterile Barrier Technique was utilized including caps, mask, sterile gowns, sterile gloves, sterile drape, hand hygiene and skin antiseptic. A timeout was performed prior to the initiation of the procedure. The patient's right chest and catheter was prepped and draped in a normal sterile fashion. Heparin was removed from both ports of catheter. 1% lidocaine was used for local anesthesia. Using gentle blunt dissection the cuff of the catheter was exposed and the catheter was removed in it's entirety. Pressure was held till hemostasis was obtained. A sterile dressing was applied. The patient tolerated the procedure well with no immediate complications. IMPRESSION: Successful catheter removal as described above. Read by: Rowe Robert, PA-C Electronically Signed   By: Corrie Mckusick D.O.   On: 06/11/2021 10:16   DG CHEST PORT 1 VIEW  Result Date: 06/11/2021 CLINICAL DATA:  Dyspnea.  Chest pain. EXAM: PORTABLE CHEST 1 VIEW COMPARISON:  06/06/2021 FINDINGS: Normal heart size scratch set stable cardiomediastinal contours. Persistent bilateral pleural effusions, left  greater than right. Mild interstitial edema. No airspace consolidation. IMPRESSION: Persistent bilateral pleural effusions, left greater than right. Mild interstitial edema. Electronically Signed   By: Kerby Moors M.D.   On: 06/11/2021 13:39     Imaging independently reviewed in Epic.    Rayn[facility] for Infectious Disease [facility] [phone] pager 06/12/2021, 11:24 AM

## 2021-06-12 NOTE — Plan of Care (Signed)

## 2021-06-12 NOTE — Progress Notes (Signed)
Progress Note  Patient Name: Cathy [name] Date of Encounter: 06/12/2021  Primary Cardiologist:   Kirk Ruths, MD   Subjective   Much more awake today.  No SOB.  No pain.  Inpatient Medications    Scheduled Meds:  (feeding supplement) PROSource Plus  30 mL Oral TID BM   calcium acetate  1,334 mg Oral TID WC   carvedilol  12.5 mg Oral BID WC   Chlorhexidine Gluconate Cloth  6 each Topical Q0600   Chlorhexidine Gluconate Cloth  6 each Topical Q0600   clobetasol cream   Topical BID   cloNIDine  0.3 mg Oral TID   [START ON 06/13/2021] darbepoetin (ARANESP) injection - DIALYSIS  150 mcg Intravenous Q Thu-HD   doxercalciferol  5 mcg Intravenous Q M,W,F-HD   heparin  5,000 Units Subcutaneous Q8H   irbesartan  75 mg Oral Daily   pantoprazole  40 mg Oral Daily   predniSONE  40 mg Oral Q breakfast   torsemide  60 mg Oral BID   triamcinolone   Topical BID   Continuous Infusions:   ceFAZolin (ANCEF) IV 1 g (06/11/21 1657)   PRN Meds: acetaminophen **OR** acetaminophen, albuterol, alum & mag hydroxide-simeth, hydrALAZINE, lidocaine, ondansetron **OR** ondansetron (ZOFRAN) IV, oxyCODONE, polyethylene glycol   Vital Signs    Vitals:   06/11/21 1337 06/11/21 1611 06/11/21 2209 06/12/21 0731  BP: (!) 156/128 (!) 164/131 (!) 176/129 (!) 164/136  Pulse: 73 75 74 69  Resp:  '18 18 18  '$ Temp:  97.8 F (36.6 C) 98 F (36.7 C) 97.7 F (36.5 C)  TempSrc:  Oral Oral Oral  SpO2:  100% 100% 100%  Weight:       No intake or output data in the 24 hours ending 06/12/21 0938 Filed Weights   06/10/21 0530 06/10/21 1720  Weight: 66.3 kg 68 kg    Telemetry    NA - Personally Reviewed  ECG    NA - Personally Reviewed  Physical Exam   GEN: No acute distress.   Neck: No  JVD Cardiac: RRR, no murmurs, rubs, or gallops.  Respiratory: Clear  to auscultation bilaterally. GI: Soft, nontender, non-distended  MS: No  edema; No deformity. Neuro:  Nonfocal  Psych: Normal affect    Labs    Chemistry Recent Labs  Lab 06/06/21 1035 06/08/21 1430 06/08/21 1923 06/09/21 0356 06/10/21 0158 06/11/21 0208 06/12/21 0327  NA 140 139  --  138 137 136 136  K 3.4* 3.4*  --  4.0 4.2 3.3* 4.3  CL 104 102  --  102 101 97* 99  CO2 27 25  --  '23 24 30 27  '$ GLUCOSE 98 87  --  168* 131* 106* 116*  BUN 29* 38*  --  40* 52* 14 29*  CREATININE 9.88* 9.90*   < > 9.58* 9.52* 3.28* 4.65*  CALCIUM 7.5* 7.4*  --  7.6* 7.5* 8.0* 8.3*  PROT 5.3* 5.8*  --  5.9*  --   --   --   ALBUMIN 2.1* 2.3*  --  2.2* 2.1*  --   --   AST 24 31  --  37  --   --   --   ALT 12 16  --  17  --   --   --   ALKPHOS 140* 156*  --  199*  --   --   --   BILITOT 1.1 0.6  --  0.6  --   --   --  GFRNONAA 5* 5*   < > 5* 5* 18* 12*  ANIONGAP 9 12  --  '13 12 9 10   '$ < > = values in this interval not displayed.     Hematology Recent Labs  Lab 06/10/21 0158 06/11/21 0208 06/12/21 0327  WBC 4.4 8.1 6.7  RBC 3.24* 3.25* 3.31*  HGB 8.2* 8.4* 8.2*  HCT 26.6* 27.0* 27.6*  MCV 82.1 83.1 83.4  MCH 25.3* 25.8* 24.8*  MCHC 30.8 31.1 29.7*  RDW 20.6* 20.6* 21.1*  PLT 236 275 300    Cardiac EnzymesNo results for input(s): TROPONINI in the last 168 hours. No results for input(s): TROPIPOC in the last 168 hours.   BNPNo results for input(s): BNP, PROBNP in the last 168 hours.   DDimer No results for input(s): DDIMER in the last 168 hours.   Radiology    IR Removal Tun Cv Cath W/O FL  Result Date: 06/11/2021 INDICATION: Patient with history of lupus nephritis and previously placed right internal jugular tunneled HD catheter on 11/30/2020; she now presents with staph bacteremia and request received for catheter removal EXAM: REMOVAL TUNNELED CENTRAL VENOUS CATHETER MEDICATIONS: % lidocaine to skin and subcutaneous tissue ANESTHESIA/SEDATION: None FLUOROSCOPY TIME:  None COMPLICATIONS: None immediate. PROCEDURE: Informed written consent was obtained from the patient after a thorough discussion of the  procedural risks, benefits and alternatives. All questions were addressed. Maximal Sterile Barrier Technique was utilized including caps, mask, sterile gowns, sterile gloves, sterile drape, hand hygiene and skin antiseptic. A timeout was performed prior to the initiation of the procedure. The patient's right chest and catheter was prepped and draped in a normal sterile fashion. Heparin was removed from both ports of catheter. 1% lidocaine was used for local anesthesia. Using gentle blunt dissection the cuff of the catheter was exposed and the catheter was removed in it's entirety. Pressure was held till hemostasis was obtained. A sterile dressing was applied. The patient tolerated the procedure well with no immediate complications. IMPRESSION: Successful catheter removal as described above. Read by: Rowe Robert, PA-C Electronically Signed   By: Corrie Mckusick D.O.   On: 06/11/2021 10:16   DG CHEST PORT 1 VIEW  Result Date: 06/11/2021 CLINICAL DATA:  Dyspnea.  Chest pain. EXAM: PORTABLE CHEST 1 VIEW COMPARISON:  06/06/2021 FINDINGS: Normal heart size scratch set stable cardiomediastinal contours. Persistent bilateral pleural effusions, left greater than right. Mild interstitial edema. No airspace consolidation. IMPRESSION: Persistent bilateral pleural effusions, left greater than right. Mild interstitial edema. Electronically Signed   By: Kerby Moors M.D.   On: 06/11/2021 13:39    Cardiac Studies   Echo 06/08/21  1. Left ventricular ejection fraction, by estimation, is 25 to 30%. The  left ventricle has severely decreased function. The left ventricle  demonstrates global hypokinesis. There is mild concentric left ventricular  hypertrophy. Left ventricular diastolic   parameters are consistent with Grade II diastolic dysfunction  (pseudonormalization).   2. Right ventricular systolic function is normal. The right ventricular  size is normal. Mildly increased right ventricular wall thickness. There   is normal pulmonary artery systolic pressure.   3. Left atrial size was severely dilated.   4. Right atrial size was severely dilated.   5. Moderate pericardial effusion. There is no evidence of cardiac  tamponade.   6. The mitral valve is grossly normal. Mild to moderate mitral valve  regurgitation.   7. The tricuspid valve is abnormal- small 0.44 echodensity seen on the  ventricular side of the tricuspid valve (  Image 19/85. Tricuspid valve  regurgitation is mild to moderate.   8. The aortic valve is tricuspid. Aortic valve regurgitation is mild. No  aortic stenosis is present.  Patient Profile     40 y.o. female with a hx of ESRD on HD TTS, lupus, anxiety, asthma, essential hypertension, GERD presented to [facility] emergency room 7/7 for face extremity rash with fever.  Patient was diagnosed with lupus and discharged on doxycycline.  Cultures came back positive and advised to came on 7/9. ECHO from 7/9 showed global LV dysfunction, visual EF looks worse 20%, estimated 25-30%. Seem by cardiology fellow (Dr. Rudi Rummage) who recommended medical therapy with carvedilol and olmesartan.  Recommended outpatient follow-up for cardiac work-up.  Cardiology seeing the patient for CHF management.   Assessment & Plan    ACUTE SYSTOLIC AND DIASTOLIC HF:  Catheter out and dialysis on hold until she gets it replaced.  Need to limit PO fluid intake for the time being.   I will ask nursing to track I/O.  Add Bidil and increase hydralazine.    PERICARDIAL EFFUSION:  We will look at again on Thursday with  the TEE.     ABNORMAL ECHO:  Density on the tricuspid valve.  Bacteremia.  NPO after MN for TEE tomorrow.    HTN:  I am going to try to maximize after load reduction and hopefully go down in clonidine.  This is being managed in the context of treating his CHF.  See above.  Decrease clonidine.    For questions or updates, please contact [place] Please consult [email or web address] for contact info under  Cardiology/STEMI.   Signed, Minus Breeding, MD  06/12/2021, 9:38 AM

## 2021-06-12 NOTE — Progress Notes (Signed)
[facility] Progress Note   Subjective:   Patient seen and examined at bedside.  Reports feeling better this AM.  Breathing improved.  Denies CP, n/v/d, abdominal pain, weakness and fatigue.    Objective Vitals:   06/11/21 1337 06/11/21 1611 06/11/21 2209 06/12/21 0731  BP: (!) 156/128 (!) 164/131 (!) 176/129 (!) 164/136  Pulse: 73 75 74 69  Resp:  '18 18 18  '$ Temp:  97.8 F (36.6 C) 98 F (36.7 C) 97.7 F (36.5 C)  TempSrc:  Oral Oral Oral  SpO2:  100% 100% 100%  Weight:       Physical Exam General:chronically ill appearing female in NAD Heart:RRR, no mrg Lungs:mostly CTAG, nml WOB on 2L via [place] Abdomen:soft, NTND Extremities:1+ LE edema Dialysis Access: none   Filed Weights   06/10/21 0530 06/10/21 1720  Weight: 66.3 kg 68 kg    Intake/Output Summary (Last 24 hours) at 06/12/2021 1216 Last data filed at 06/12/2021 1100 Gross per 24 hour  Intake 220 ml  Output --  Net 220 ml    Additional Objective Labs: Basic Metabolic Panel: Recent Labs  Lab 06/10/21 0158 06/11/21 0208 06/12/21 0327  NA 137 136 136  K 4.2 3.3* 4.3  CL 101 97* 99  CO2 '24 30 27  '$ GLUCOSE 131* 106* 116*  BUN 52* 14 29*  CREATININE 9.52* 3.28* 4.65*  CALCIUM 7.5* 8.0* 8.3*  PHOS 6.0*  --   --    Liver Function Tests: Recent Labs  Lab 06/06/21 1035 06/08/21 1430 06/09/21 0356 06/10/21 0158  AST 24 31 37  --   ALT '12 16 17  '$ --   ALKPHOS 140* 156* 199*  --   BILITOT 1.1 0.6 0.6  --   PROT 5.3* 5.8* 5.9*  --   ALBUMIN 2.1* 2.3* 2.2* 2.1*    CBC: Recent Labs  Lab 06/06/21 1035 06/08/21 1430 06/08/21 1923 06/09/21 0356 06/10/21 0158 06/11/21 0208 06/12/21 0327  WBC 5.7 6.7 6.6 5.7 4.4 8.1 6.7  NEUTROABS 4.9 4.6  --   --  3.2  --   --   HGB 7.8* 8.1* 8.7* 8.2* 8.2* 8.4* 8.2*  HCT 26.1* 27.5* 29.5* 27.0* 26.6* 27.0* 27.6*  MCV 84.7 84.4 83.6 83.1 82.1 83.1 83.4  PLT 200 233 248 219 236 275 300    Studies/Results: IR Removal Tun Cv Cath W/O FL  Result  Date: 06/11/2021 INDICATION: Patient with history of lupus nephritis and previously placed right internal jugular tunneled HD catheter on 11/30/2020; she now presents with staph bacteremia and request received for catheter removal EXAM: REMOVAL TUNNELED CENTRAL VENOUS CATHETER MEDICATIONS: % lidocaine to skin and subcutaneous tissue ANESTHESIA/SEDATION: None FLUOROSCOPY TIME:  None COMPLICATIONS: None immediate. PROCEDURE: Informed written consent was obtained from the patient after a thorough discussion of the procedural risks, benefits and alternatives. All questions were addressed. Maximal Sterile Barrier Technique was utilized including caps, mask, sterile gowns, sterile gloves, sterile drape, hand hygiene and skin antiseptic. A timeout was performed prior to the initiation of the procedure. The patient's right chest and catheter was prepped and draped in a normal sterile fashion. Heparin was removed from both ports of catheter. 1% lidocaine was used for local anesthesia. Using gentle blunt dissection the cuff of the catheter was exposed and the catheter was removed in it's entirety. Pressure was held till hemostasis was obtained. A sterile dressing was applied. The patient tolerated the procedure well with no immediate complications. IMPRESSION: Successful catheter removal as described above. Read by:  Rowe Robert, PA-C Electronically Signed   By: Corrie Mckusick D.O.   On: 06/11/2021 10:16   DG CHEST PORT 1 VIEW  Result Date: 06/11/2021 CLINICAL DATA:  Dyspnea.  Chest pain. EXAM: PORTABLE CHEST 1 VIEW COMPARISON:  06/06/2021 FINDINGS: Normal heart size scratch set stable cardiomediastinal contours. Persistent bilateral pleural effusions, left greater than right. Mild interstitial edema. No airspace consolidation. IMPRESSION: Persistent bilateral pleural effusions, left greater than right. Mild interstitial edema. Electronically Signed   By: Kerby Moors M.D.   On: 06/11/2021 13:39    Medications:    ceFAZolin (ANCEF) IV 1 g (06/11/21 1657)    (feeding supplement) PROSource Plus  30 mL Oral TID BM   calcium acetate  1,334 mg Oral TID WC   carvedilol  12.5 mg Oral BID WC   Chlorhexidine Gluconate Cloth  6 each Topical Q0600   Chlorhexidine Gluconate Cloth  6 each Topical Q0600   clobetasol cream   Topical BID   cloNIDine  0.2 mg Oral TID   [START ON 06/13/2021] darbepoetin (ARANESP) injection - DIALYSIS  150 mcg Intravenous Q Thu-HD   doxercalciferol  5 mcg Intravenous Q M,W,F-HD   heparin  5,000 Units Subcutaneous Q8H   irbesartan  150 mg Oral Daily   isosorbide-hydrALAZINE  1 tablet Oral TID   pantoprazole  40 mg Oral Daily   predniSONE  40 mg Oral Q breakfast   torsemide  60 mg Oral BID   triamcinolone   Topical BID    Dialysis Orders: TTS - [facility]  4hrs, BFR 400, DFR 800,  EDW 67kg, 2K/ 2Ca   Mircera 200 mcg q2wks - last 05/28/21 Hectorol 80mg IV qHD - last 05/30/21 Venofer '50mg'$  IV weekly - last 05/28/21 Calcium Acetate '667mg'$  2 tabs TID with meals   Last Labs: Hgb 8.2, TSAT 11 (OP HD 05/07/21), K 4.0, Ca 7.6, Alb 2.2   Assessment/Plan: MSSA Bacteremia: Blood Cx 7/7 (+) MSSA sensitive to oxacillin. Blood cultures X 2 were repeated 7/9 with NGTD. ID consulted recommend line holiday and continue cefazolin. TDC removed Small echodensity noted on TV, plan for TEE tomorrow at 1400.   Lupus Flare/Systemic Rash: Continue Prednisone New Onset HFrEF: ECHO 7/9 showed worsened EF 25-30%, G2DD, moderate pleural effusion-no evidence of cardiac tamponade, and a small echodensity on TV. Cardiology following.  [facility]  Will put [facility] if possible.  Plan for dialysis to follow.  Hypertension/volume  - Blood pressure remains elevated. Medications being managed by cardiology.  Lower extremity edema with faint crackles on exam.  Reiterated the importance of fluid  restriction. Anemia of CKD - Hgb 8.2 will resume Aranesp, not given with HD yesterday, change to be given Thursday.  Secondary Hyperparathyroidism - Corr Ca ok.  last Phos elevated.  Continue VDRA and binders. Nutrition - Albumin low 2.2-will add protein supplements. Renal diet w/fluid restrictions.  Pericardial effusion - noted on TTE, Cardiology to reassess with TEE tomorrow.   LJen Mow PA-C [facility] 06/12/2021,12:16 PM  LOS: 4 days

## 2021-06-13 ENCOUNTER — Inpatient Hospital Stay (HOSPITAL_COMMUNITY): Payer: Medicare Other

## 2021-06-13 ENCOUNTER — Inpatient Hospital Stay (HOSPITAL_COMMUNITY): Payer: Medicare Other | Admitting: Anesthesiology

## 2021-06-13 ENCOUNTER — Encounter (HOSPITAL_COMMUNITY): Payer: Self-pay | Admitting: Internal Medicine

## 2021-06-13 ENCOUNTER — Encounter (HOSPITAL_COMMUNITY)
Admission: EM | Disposition: A | Discharge status: Home / Self Care | Payer: Self-pay | Source: Home / Self Care | Attending: Internal Medicine

## 2021-06-13 DIAGNOSIS — I313 Pericardial effusion (noninflammatory): Secondary | ICD-10-CM

## 2021-06-13 DIAGNOSIS — I340 Nonrheumatic mitral (valve) insufficiency: Secondary | ICD-10-CM

## 2021-06-13 DIAGNOSIS — I361 Nonrheumatic tricuspid (valve) insufficiency: Secondary | ICD-10-CM

## 2021-06-13 HISTORY — PX: IR US GUIDE VASC ACCESS RIGHT: IMG2390

## 2021-06-13 HISTORY — PX: IR FLUORO GUIDE CV LINE RIGHT: IMG2283

## 2021-06-13 HISTORY — PX: TEE WITHOUT CARDIOVERSION: SHX5443

## 2021-06-13 LAB — CBC
HCT: 25 % — ABNORMAL LOW (ref 36.0–46.0)
Hemoglobin: 7.9 g/dL — ABNORMAL LOW (ref 12.0–15.0)
MCH: 25.8 pg — ABNORMAL LOW (ref 26.0–34.0)
MCHC: 31.6 g/dL (ref 30.0–36.0)
MCV: 81.7 fL (ref 80.0–100.0)
Platelets: 314 10*3/uL (ref 150–400)
RBC: 3.06 MIL/uL — ABNORMAL LOW (ref 3.87–5.11)
RDW: 21.1 % — ABNORMAL HIGH (ref 11.5–15.5)
WBC: 7.5 10*3/uL (ref 4.0–10.5)
nRBC: 0.4 % — ABNORMAL HIGH (ref 0.0–0.2)

## 2021-06-13 LAB — CULTURE, BLOOD (ROUTINE X 2)
Culture: NO GROWTH
Culture: NO GROWTH
Special Requests: ADEQUATE
Special Requests: ADEQUATE

## 2021-06-13 LAB — ECHO TEE
AV Mean grad: 5 mmHg
AV Peak grad: 7.3 mmHg
Ao pk vel: 1.35 m/s

## 2021-06-13 LAB — RENAL FUNCTION PANEL
Albumin: 2.1 g/dL — ABNORMAL LOW (ref 3.5–5.0)
Anion gap: 9 (ref 5–15)
BUN: 48 mg/dL — ABNORMAL HIGH (ref 6–20)
CO2: 27 mmol/L (ref 22–32)
Calcium: 7.7 mg/dL — ABNORMAL LOW (ref 8.9–10.3)
Chloride: 100 mmol/L (ref 98–111)
Creatinine, Ser: 5.36 mg/dL — ABNORMAL HIGH (ref 0.44–1.00)
GFR, Estimated: 10 mL/min — ABNORMAL LOW (ref >60)
Glucose, Bld: 100 mg/dL — ABNORMAL HIGH (ref 70–99)
Phosphorus: 3.5 mg/dL (ref 2.5–4.6)
Potassium: 3.3 mmol/L — ABNORMAL LOW (ref 3.5–5.1)
Sodium: 136 mmol/L (ref 135–145)

## 2021-06-13 LAB — HEPATITIS B SURFACE ANTIGEN: Hepatitis B Surface Ag: NONREACTIVE

## 2021-06-13 SURGERY — ECHOCARDIOGRAM, TRANSESOPHAGEAL
Anesthesia: Monitor Anesthesia Care

## 2021-06-13 MED ORDER — SODIUM CHLORIDE 0.9 % IV SOLN: INTRAVENOUS | Status: DC

## 2021-06-13 MED ORDER — LIDOCAINE HCL (PF) 1 % IJ SOLN
5.0000 mL | INTRAMUSCULAR | Status: DC | PRN
  Filled 2021-06-13: qty 5

## 2021-06-13 MED ORDER — FENTANYL CITRATE (PF) 100 MCG/2ML IJ SOLN
INTRAMUSCULAR | Status: AC | PRN
  Administered 2021-06-13 (×4): 25 ug via INTRAVENOUS

## 2021-06-13 MED ORDER — HEPARIN SODIUM (PORCINE) 1000 UNIT/ML DIALYSIS
1000.0000 [IU] | INTRAMUSCULAR | Status: DC | PRN
  Filled 2021-06-13 (×2): qty 1

## 2021-06-13 MED ORDER — MIDAZOLAM HCL 2 MG/2ML IJ SOLN
INTRAMUSCULAR | Status: AC | PRN
  Administered 2021-06-13 (×2): 0.5 mg via INTRAVENOUS
  Administered 2021-06-13: 1 mg via INTRAVENOUS

## 2021-06-13 MED ORDER — MIDAZOLAM HCL (PF) 5 MG/ML IJ SOLN
INTRAMUSCULAR | Status: AC
  Filled 2021-06-13: qty 1

## 2021-06-13 MED ORDER — LIDOCAINE HCL (PF) 1 % IJ SOLN
INTRAMUSCULAR | Status: AC | PRN
  Administered 2021-06-13: 10 mL via SUBCUTANEOUS

## 2021-06-13 MED ORDER — ALTEPLASE 2 MG IJ SOLR
2.0000 mg | Freq: Once | INTRAMUSCULAR | Status: DC | PRN
  Filled 2021-06-13: qty 2

## 2021-06-13 MED ORDER — HEPARIN SODIUM (PORCINE) 1000 UNIT/ML IJ SOLN
INTRAMUSCULAR | Status: AC
  Filled 2021-06-13: qty 4

## 2021-06-13 MED ORDER — PROPOFOL 500 MG/50ML IV EMUL
INTRAVENOUS | Status: DC | PRN
  Administered 2021-06-13: 50 ug/kg/min via INTRAVENOUS

## 2021-06-13 MED ORDER — ISOSORB DINITRATE-HYDRALAZINE 20-37.5 MG PO TABS
2.0000 | ORAL_TABLET | Freq: Three times a day (TID) | ORAL | Status: DC
  Administered 2021-06-13 – 2021-06-14 (×3): 2 via ORAL
  Filled 2021-06-13 (×5): qty 2

## 2021-06-13 MED ORDER — LIDOCAINE-PRILOCAINE 2.5-2.5 % EX CREA: 1.0000 "application " | TOPICAL_CREAM | CUTANEOUS | Status: DC | PRN

## 2021-06-13 MED ORDER — PENTAFLUOROPROP-TETRAFLUOROETH EX AERO: 1.0000 "application " | INHALATION_SPRAY | CUTANEOUS | Status: DC | PRN

## 2021-06-13 MED ORDER — SODIUM CHLORIDE 0.9 % IV SOLN: INTRAVENOUS | Status: DC | PRN

## 2021-06-13 MED ORDER — MIDAZOLAM HCL 5 MG/5ML IJ SOLN
INTRAMUSCULAR | Status: DC | PRN
  Administered 2021-06-13: 2 mg via INTRAVENOUS

## 2021-06-13 MED ORDER — FENTANYL CITRATE (PF) 100 MCG/2ML IJ SOLN
INTRAMUSCULAR | Status: AC
  Filled 2021-06-13: qty 2

## 2021-06-13 MED ORDER — SODIUM CHLORIDE 0.9 % IV SOLN: 100.0000 mL | INTRAVENOUS | Status: DC | PRN

## 2021-06-13 MED ORDER — DARBEPOETIN ALFA 150 MCG/0.3ML IJ SOSY
PREFILLED_SYRINGE | INTRAMUSCULAR | Status: AC
  Filled 2021-06-13: qty 0.3

## 2021-06-13 MED ORDER — CEFAZOLIN SODIUM-DEXTROSE 2-4 GM/100ML-% IV SOLN: 2.0000 g | Freq: Once | INTRAVENOUS | Status: AC

## 2021-06-13 MED ORDER — HEPARIN SODIUM (PORCINE) 1000 UNIT/ML IJ SOLN
INTRAMUSCULAR | Status: AC | PRN
  Administered 2021-06-13: 3.8 [IU] via INTRAVENOUS

## 2021-06-13 MED ORDER — LIDOCAINE-EPINEPHRINE 1 %-1:100000 IJ SOLN
INTRAMUSCULAR | Status: AC
  Filled 2021-06-13: qty 1

## 2021-06-13 MED ORDER — HEPARIN SODIUM (PORCINE) 1000 UNIT/ML IJ SOLN
INTRAMUSCULAR | Status: AC
  Filled 2021-06-13: qty 1

## 2021-06-13 MED ORDER — HEPARIN SODIUM (PORCINE) 1000 UNIT/ML IJ SOLN
INTRAMUSCULAR | Status: AC
  Administered 2021-06-13: 1000 [IU] via INTRAVENOUS_CENTRAL
  Filled 2021-06-13: qty 1

## 2021-06-13 MED ORDER — CEFAZOLIN SODIUM-DEXTROSE 2-4 GM/100ML-% IV SOLN
INTRAVENOUS | Status: AC
  Administered 2021-06-13: 2 g via INTRAVENOUS
  Filled 2021-06-13: qty 100

## 2021-06-13 MED ORDER — PROPOFOL 10 MG/ML IV BOLUS
INTRAVENOUS | Status: DC | PRN
  Administered 2021-06-13 (×2): 10 mg via INTRAVENOUS

## 2021-06-13 MED ORDER — MIDAZOLAM HCL 2 MG/2ML IJ SOLN
INTRAMUSCULAR | Status: AC
  Filled 2021-06-13: qty 2

## 2021-06-13 MED ORDER — CARVEDILOL 25 MG PO TABS
25.0000 mg | ORAL_TABLET | Freq: Two times a day (BID) | ORAL | Status: DC
  Administered 2021-06-13 – 2021-06-14 (×2): 25 mg via ORAL
  Filled 2021-06-13 (×2): qty 1

## 2021-06-13 NOTE — Progress Notes (Addendum)
Progress Note  Patient Name: Cathy [name] Date of Encounter: 06/13/2021  [facility] Cardiologist: Kirk Ruths, MD   Subjective   Patient underwent tunneled HD catheter placement this morning by IR, she states she is feeling hungry. She states her chest pain had resolved, rash is better, denied any SOB, fever,chills, leg edema. She states she suppose to get dialysis today. She states she is always on oxygen at home. She states she urinates a little daily.   Inpatient Medications    Scheduled Meds:  (feeding supplement) PROSource Plus  30 mL Oral TID BM   calcium acetate  1,334 mg Oral TID WC   carvedilol  25 mg Oral BID WC   Chlorhexidine Gluconate Cloth  6 each Topical Q0600   Chlorhexidine Gluconate Cloth  6 each Topical Q0600   Chlorhexidine Gluconate Cloth  6 each Topical Q0600   clobetasol cream   Topical BID   cloNIDine  0.2 mg Oral TID   darbepoetin (ARANESP) injection - DIALYSIS  150 mcg Intravenous Q Thu-HD   doxercalciferol  5 mcg Intravenous Q M,W,F-HD   fentaNYL       heparin  5,000 Units Subcutaneous Q8H   heparin sodium (porcine)       irbesartan  150 mg Oral Daily   isosorbide-hydrALAZINE  1 tablet Oral TID   lidocaine-EPINEPHrine       midazolam       pantoprazole  40 mg Oral Daily   predniSONE  40 mg Oral Q breakfast   torsemide  60 mg Oral BID   triamcinolone   Topical BID   Continuous Infusions:  sodium chloride     sodium chloride      ceFAZolin (ANCEF) IV 1 g (06/12/21 1640)   PRN Meds: sodium chloride, sodium chloride, acetaminophen **OR** acetaminophen, albuterol, alteplase, alum & mag hydroxide-simeth, heparin, hydrALAZINE, lidocaine (PF), lidocaine-prilocaine, ondansetron **OR** ondansetron (ZOFRAN) IV, oxyCODONE, pentafluoroprop-tetrafluoroeth, polyethylene glycol   Vital Signs    Vitals:   06/13/21 0835 06/13/21 0840 06/13/21 0845 06/13/21 0852  BP: (!) 166/130 (!) 175/131 (!) 176/135 (!) 172/131  Pulse: 76 81 80 85  Resp: '19  20 20 20  '$ Temp:      TempSrc:      SpO2: 100% 100% 100% 96%  Weight:        Intake/Output Summary (Last 24 hours) at 06/13/2021 0923 Last data filed at 06/12/2021 1100 Gross per 24 hour  Intake 220 ml  Output --  Net 220 ml   Last 3 Weights 06/10/2021 06/10/2021 06/06/2021  Weight (lbs) 149 lb 14.6 oz 146 lb 1.6 oz 147 lb 0.8 oz  Weight (kg) 68 kg 66.271 kg 66.7 kg      Telemetry    Not connected at this time  - Personally Reviewed  ECG    N/A this AM - Personally Reviewed  Physical Exam   GEN: No acute distress.  Facial rash noted.  Neck: No JVD Cardiac: RRR, no murmurs, rubs, or gallops.  Respiratory: Clear to auscultation bilaterally. On 2LNC. Speaks full sentence.  GI: Soft, nontender, non-distended  MS: No BLE edema; No deformity. Neuro:  Nonfocal  Psych: Normal affect Right chest tunneled dialysis catheter in place, dressing dry.    Labs    High Sensitivity Troponin:  No results for input(s): TROPONINIHS in the last 720 hours.    Chemistry Recent Labs  Lab 06/06/21 1035 06/08/21 1430 06/08/21 1923 06/09/21 0356 06/10/21 0158 06/11/21 0208 06/12/21 0327  NA 140 139  --  138 137 136 136  K 3.4* 3.4*  --  4.0 4.2 3.3* 4.3  CL 104 102  --  102 101 97* 99  CO2 27 25  --  '23 24 30 27  '$ GLUCOSE 98 87  --  168* 131* 106* 116*  BUN 29* 38*  --  40* 52* 14 29*  CREATININE 9.88* 9.90*   < > 9.58* 9.52* 3.28* 4.65*  CALCIUM 7.5* 7.4*  --  7.6* 7.5* 8.0* 8.3*  PROT 5.3* 5.8*  --  5.9*  --   --   --   ALBUMIN 2.1* 2.3*  --  2.2* 2.1*  --   --   AST 24 31  --  37  --   --   --   ALT 12 16  --  17  --   --   --   ALKPHOS 140* 156*  --  199*  --   --   --   BILITOT 1.1 0.6  --  0.6  --   --   --   GFRNONAA 5* 5*   < > 5* 5* 18* 12*  ANIONGAP 9 12  --  '13 12 9 10   '$ < > = values in this interval not displayed.     Hematology Recent Labs  Lab 06/10/21 0158 06/11/21 0208 06/12/21 0327  WBC 4.4 8.1 6.7  RBC 3.24* 3.25* 3.31*  HGB 8.2* 8.4* 8.2*  HCT 26.6*  27.0* 27.6*  MCV 82.1 83.1 83.4  MCH 25.3* 25.8* 24.8*  MCHC 30.8 31.1 29.7*  RDW 20.6* 20.6* 21.1*  PLT 236 275 300    BNPNo results for input(s): BNP, PROBNP in the last 168 hours.   DDimer No results for input(s): DDIMER in the last 168 hours.   Radiology    IR Removal Tun Cv Cath W/O FL  Result Date: 06/11/2021 INDICATION: Patient with history of lupus nephritis and previously placed right internal jugular tunneled HD catheter on 11/30/2020; she now presents with staph bacteremia and request received for catheter removal EXAM: REMOVAL TUNNELED CENTRAL VENOUS CATHETER MEDICATIONS: % lidocaine to skin and subcutaneous tissue ANESTHESIA/SEDATION: None FLUOROSCOPY TIME:  None COMPLICATIONS: None immediate. PROCEDURE: Informed written consent was obtained from the patient after a thorough discussion of the procedural risks, benefits and alternatives. All questions were addressed. Maximal Sterile Barrier Technique was utilized including caps, mask, sterile gowns, sterile gloves, sterile drape, hand hygiene and skin antiseptic. A timeout was performed prior to the initiation of the procedure. The patient's right chest and catheter was prepped and draped in a normal sterile fashion. Heparin was removed from both ports of catheter. 1% lidocaine was used for local anesthesia. Using gentle blunt dissection the cuff of the catheter was exposed and the catheter was removed in it's entirety. Pressure was held till hemostasis was obtained. A sterile dressing was applied. The patient tolerated the procedure well with no immediate complications. IMPRESSION: Successful catheter removal as described above. Read by: Rowe Robert, PA-C Electronically Signed   By: Corrie Mckusick D.O.   On: 06/11/2021 10:16   DG CHEST PORT 1 VIEW  Result Date: 06/11/2021 CLINICAL DATA:  Dyspnea.  Chest pain. EXAM: PORTABLE CHEST 1 VIEW COMPARISON:  06/06/2021 FINDINGS: Normal heart size scratch set stable cardiomediastinal contours.  Persistent bilateral pleural effusions, left greater than right. Mild interstitial edema. No airspace consolidation. IMPRESSION: Persistent bilateral pleural effusions, left greater than right. Mild interstitial edema. Electronically Signed   By: Kerby Moors M.D.   On:  06/11/2021 13:39    Cardiac Studies   Echo from 06/08/21:   1. Left ventricular ejection fraction, by estimation, is 25 to 30%. The  left ventricle has severely decreased function. The left ventricle  demonstrates global hypokinesis. There is mild concentric left ventricular  hypertrophy. Left ventricular diastolic   parameters are consistent with Grade II diastolic dysfunction  (pseudonormalization).   2. Right ventricular systolic function is normal. The right ventricular  size is normal. Mildly increased right ventricular wall thickness. There  is normal pulmonary artery systolic pressure.   3. Left atrial size was severely dilated.   4. Right atrial size was severely dilated.   5. Moderate pericardial effusion. There is no evidence of cardiac  tamponade.   6. The mitral valve is grossly normal. Mild to moderate mitral valve  regurgitation.   7. The tricuspid valve is abnormal- small 0.44 echodensity seen on the  ventricular side of the tricuspid valve (Image 19/85. Tricuspid valve  regurgitation is mild to moderate.   8. The aortic valve is tricuspid. Aortic valve regurgitation is mild. No  aortic stenosis is present.   Comparison(s): A prior study was performed on 11/15/2020. Prior images  reviewed side by side. LVEF is significantly worse. Small echo density on  tricuspid valve was only seen in PSAX axis (not well visualized in prior  study).    Patient Profile     40 y.o. female hx of ESRD on HD TTS, lupus, anxiety, asthma, essential hypertension, GERD, recently seen in the ED on 7/7 for a rash to her face and extremities and diagnosed with a lupus flare, had fever at dialysis and was discharged on  doxycycline.  Blood cultures were drawn during that visit positive for MSSA. She was advised return to ER on 06/08/21, c/o pain all over her body, fatigue, and pleuritic chest pain. Echo showed EF 25-30% (was 55-60% on 11/15/20), LV global hypokinesis, grade II DD, severe dilated LA and RA, moderate pericardial effusion, mild to moderate MR, small 0.44 echodensity seen on the ventricular side of the tricuspid valve. She was continued on GDMT with Coreg, Avapro, torsemide. TEE planned 06/13/21 for further evaluation of tricuspid vegetation.    Assessment & Plan    Newly diagnosed systolic and diastolic heart failure  - presented with pleuritic chest pain, + MSSA bacteremia from recent ER visit  - EF 25-30% (was 55-60% on 11/15/20), LV global hypokinesis, grade II DD, severe dilated LA and RA, moderate pericardial effusion, mild to moderate MR, small 0.44 echodensity seen on the ventricular side of the tricuspid valve. 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 in the setting of MSSA bacteremia /fever/ HD  - TEE is pending today  HTN - BP remains elevated , on Coreg 12.'5mg'$  BID, clonidine 0.'2mg'$  TID, irbesartan '150mg'$  daily, bidil 20/37.'5mg'$  TID, and torsemide '60mg'$  BID, will up-tritiate Coreg to '25mg'$  BID today   MSSA bacteremia - ID following, TEE pending, on cefazolin, repeat Bcx 06/08/21 NTD, afebrile    SLE ESRD GERD Anemia  Anxiety  Asthma  Pain - managed per IM      For questions or updates,  please contact [place] Please consult [email or web address] for contact info under        Signed, Margie Billet, NP  06/13/2021, 9:23 AM    History and all data above reviewed.  Patient examined.  I agree with the findings as above.  The patient exam reveals COR:RRR, systolic soft brief murmur  ,  Lungs: No crackles  ,  Abd: Positive bowel sounds, no rebound no guarding, Ext Mild edema   .  All available labs, radiology testing, previous records reviewed. Agree with documented assessment and plan. CARDIOMYOPATHY:  Titrating meds.  Increased ARB yesterday.  Added Bidil which I will increase today.  HTN: This is being managed in the context of treating his CHF.  Not controlled.  I was trying to wean down the clonidine but likely wont be able to .  Abnormal echo:  For TEE today.    Cathy [name]  10:46 AM  06/13/2021

## 2021-06-13 NOTE — H&P (View-Only) (Signed)
Progress Note  Patient Name: Cathy [name] Date of Encounter: 06/13/2021  [facility] Cardiologist: Kirk Ruths, MD   Subjective   Patient underwent tunneled HD catheter placement this morning by IR, she states she is feeling hungry. She states her chest pain had resolved, rash is better, denied any SOB, fever,chills, leg edema. She states she suppose to get dialysis today. She states she is always on oxygen at home. She states she urinates a little daily.   Inpatient Medications    Scheduled Meds:  (feeding supplement) PROSource Plus  30 mL Oral TID BM   calcium acetate  1,334 mg Oral TID WC   carvedilol  25 mg Oral BID WC   Chlorhexidine Gluconate Cloth  6 each Topical Q0600   Chlorhexidine Gluconate Cloth  6 each Topical Q0600   Chlorhexidine Gluconate Cloth  6 each Topical Q0600   clobetasol cream   Topical BID   cloNIDine  0.2 mg Oral TID   darbepoetin (ARANESP) injection - DIALYSIS  150 mcg Intravenous Q Thu-HD   doxercalciferol  5 mcg Intravenous Q M,W,F-HD   fentaNYL       heparin  5,000 Units Subcutaneous Q8H   heparin sodium (porcine)       irbesartan  150 mg Oral Daily   isosorbide-hydrALAZINE  1 tablet Oral TID   lidocaine-EPINEPHrine       midazolam       pantoprazole  40 mg Oral Daily   predniSONE  40 mg Oral Q breakfast   torsemide  60 mg Oral BID   triamcinolone   Topical BID   Continuous Infusions:  sodium chloride     sodium chloride      ceFAZolin (ANCEF) IV 1 g (06/12/21 1640)   PRN Meds: sodium chloride, sodium chloride, acetaminophen **OR** acetaminophen, albuterol, alteplase, alum & mag hydroxide-simeth, heparin, hydrALAZINE, lidocaine (PF), lidocaine-prilocaine, ondansetron **OR** ondansetron (ZOFRAN) IV, oxyCODONE, pentafluoroprop-tetrafluoroeth, polyethylene glycol   Vital Signs    Vitals:   06/13/21 0835 06/13/21 0840 06/13/21 0845 06/13/21 0852  BP: (!) 166/130 (!) 175/131 (!) 176/135 (!) 172/131  Pulse: 76 81 80 85  Resp: '19  20 20 20  '$ Temp:      TempSrc:      SpO2: 100% 100% 100% 96%  Weight:        Intake/Output Summary (Last 24 hours) at 06/13/2021 0923 Last data filed at 06/12/2021 1100 Gross per 24 hour  Intake 220 ml  Output --  Net 220 ml   Last 3 Weights 06/10/2021 06/10/2021 06/06/2021  Weight (lbs) 149 lb 14.6 oz 146 lb 1.6 oz 147 lb 0.8 oz  Weight (kg) 68 kg 66.271 kg 66.7 kg      Telemetry    Not connected at this time  - Personally Reviewed  ECG    N/A this AM - Personally Reviewed  Physical Exam   GEN: No acute distress.  Facial rash noted.  Neck: No JVD Cardiac: RRR, no murmurs, rubs, or gallops.  Respiratory: Clear to auscultation bilaterally. On 2LNC. Speaks full sentence.  GI: Soft, nontender, non-distended  MS: No BLE edema; No deformity. Neuro:  Nonfocal  Psych: Normal affect Right chest tunneled dialysis catheter in place, dressing dry.    Labs    High Sensitivity Troponin:  No results for input(s): TROPONINIHS in the last 720 hours.    Chemistry Recent Labs  Lab 06/06/21 1035 06/08/21 1430 06/08/21 1923 06/09/21 0356 06/10/21 0158 06/11/21 0208 06/12/21 0327  NA 140 139  --  138 137 136 136  K 3.4* 3.4*  --  4.0 4.2 3.3* 4.3  CL 104 102  --  102 101 97* 99  CO2 27 25  --  '23 24 30 27  '$ GLUCOSE 98 87  --  168* 131* 106* 116*  BUN 29* 38*  --  40* 52* 14 29*  CREATININE 9.88* 9.90*   < > 9.58* 9.52* 3.28* 4.65*  CALCIUM 7.5* 7.4*  --  7.6* 7.5* 8.0* 8.3*  PROT 5.3* 5.8*  --  5.9*  --   --   --   ALBUMIN 2.1* 2.3*  --  2.2* 2.1*  --   --   AST 24 31  --  37  --   --   --   ALT 12 16  --  17  --   --   --   ALKPHOS 140* 156*  --  199*  --   --   --   BILITOT 1.1 0.6  --  0.6  --   --   --   GFRNONAA 5* 5*   < > 5* 5* 18* 12*  ANIONGAP 9 12  --  '13 12 9 10   '$ < > = values in this interval not displayed.     Hematology Recent Labs  Lab 06/10/21 0158 06/11/21 0208 06/12/21 0327  WBC 4.4 8.1 6.7  RBC 3.24* 3.25* 3.31*  HGB 8.2* 8.4* 8.2*  HCT 26.6*  27.0* 27.6*  MCV 82.1 83.1 83.4  MCH 25.3* 25.8* 24.8*  MCHC 30.8 31.1 29.7*  RDW 20.6* 20.6* 21.1*  PLT 236 275 300    BNPNo results for input(s): BNP, PROBNP in the last 168 hours.   DDimer No results for input(s): DDIMER in the last 168 hours.   Radiology    IR Removal Tun Cv Cath W/O FL  Result Date: 06/11/2021 INDICATION: Patient with history of lupus nephritis and previously placed right internal jugular tunneled HD catheter on 11/30/2020; she now presents with staph bacteremia and request received for catheter removal EXAM: REMOVAL TUNNELED CENTRAL VENOUS CATHETER MEDICATIONS: % lidocaine to skin and subcutaneous tissue ANESTHESIA/SEDATION: None FLUOROSCOPY TIME:  None COMPLICATIONS: None immediate. PROCEDURE: Informed written consent was obtained from the patient after a thorough discussion of the procedural risks, benefits and alternatives. All questions were addressed. Maximal Sterile Barrier Technique was utilized including caps, mask, sterile gowns, sterile gloves, sterile drape, hand hygiene and skin antiseptic. A timeout was performed prior to the initiation of the procedure. The patient's right chest and catheter was prepped and draped in a normal sterile fashion. Heparin was removed from both ports of catheter. 1% lidocaine was used for local anesthesia. Using gentle blunt dissection the cuff of the catheter was exposed and the catheter was removed in it's entirety. Pressure was held till hemostasis was obtained. A sterile dressing was applied. The patient tolerated the procedure well with no immediate complications. IMPRESSION: Successful catheter removal as described above. Read by: Rowe Robert, PA-C Electronically Signed   By: Corrie Mckusick D.O.   On: 06/11/2021 10:16   DG CHEST PORT 1 VIEW  Result Date: 06/11/2021 CLINICAL DATA:  Dyspnea.  Chest pain. EXAM: PORTABLE CHEST 1 VIEW COMPARISON:  06/06/2021 FINDINGS: Normal heart size scratch set stable cardiomediastinal contours.  Persistent bilateral pleural effusions, left greater than right. Mild interstitial edema. No airspace consolidation. IMPRESSION: Persistent bilateral pleural effusions, left greater than right. Mild interstitial edema. Electronically Signed   By: Kerby Moors M.D.   On:  06/11/2021 13:39    Cardiac Studies   Echo from 06/08/21:   1. Left ventricular ejection fraction, by estimation, is 25 to 30%. The  left ventricle has severely decreased function. The left ventricle  demonstrates global hypokinesis. There is mild concentric left ventricular  hypertrophy. Left ventricular diastolic   parameters are consistent with Grade II diastolic dysfunction  (pseudonormalization).   2. Right ventricular systolic function is normal. The right ventricular  size is normal. Mildly increased right ventricular wall thickness. There  is normal pulmonary artery systolic pressure.   3. Left atrial size was severely dilated.   4. Right atrial size was severely dilated.   5. Moderate pericardial effusion. There is no evidence of cardiac  tamponade.   6. The mitral valve is grossly normal. Mild to moderate mitral valve  regurgitation.   7. The tricuspid valve is abnormal- small 0.44 echodensity seen on the  ventricular side of the tricuspid valve (Image 19/85. Tricuspid valve  regurgitation is mild to moderate.   8. The aortic valve is tricuspid. Aortic valve regurgitation is mild. No  aortic stenosis is present.   Comparison(s): A prior study was performed on 11/15/2020. Prior images  reviewed side by side. LVEF is significantly worse. Small echo density on  tricuspid valve was only seen in PSAX axis (not well visualized in prior  study).    Patient Profile     40 y.o. female hx of ESRD on HD TTS, lupus, anxiety, asthma, essential hypertension, GERD, recently seen in the ED on 7/7 for a rash to her face and extremities and diagnosed with a lupus flare, had fever at dialysis and was discharged on  doxycycline.  Blood cultures were drawn during that visit positive for MSSA. She was advised return to ER on 06/08/21, c/o pain all over her body, fatigue, and pleuritic chest pain. Echo showed EF 25-30% (was 55-60% on 11/15/20), LV global hypokinesis, grade II DD, severe dilated LA and RA, moderate pericardial effusion, mild to moderate MR, small 0.44 echodensity seen on the ventricular side of the tricuspid valve. She was continued on GDMT with Coreg, Avapro, torsemide. TEE planned 06/13/21 for further evaluation of tricuspid vegetation.    Assessment & Plan    Newly diagnosed systolic and diastolic heart failure  - presented with pleuritic chest pain, + MSSA bacteremia from recent ER visit  - EF 25-30% (was 55-60% on 11/15/20), LV global hypokinesis, grade II DD, severe dilated LA and RA, moderate pericardial effusion, mild to moderate MR, small 0.44 echodensity seen on the ventricular side of the tricuspid valve. 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 in the setting of MSSA bacteremia /fever/ HD  - TEE is pending today  HTN - BP remains elevated , on Coreg 12.'5mg'$  BID, clonidine 0.'2mg'$  TID, irbesartan '150mg'$  daily, bidil 20/37.'5mg'$  TID, and torsemide '60mg'$  BID, will up-tritiate Coreg to '25mg'$  BID today   MSSA bacteremia - ID following, TEE pending, on cefazolin, repeat Bcx 06/08/21 NTD, afebrile    SLE ESRD GERD Anemia  Anxiety  Asthma  Pain - managed per IM      For questions or updates,  please contact [place] Please consult [email or web address] for contact info under        Signed, Margie Billet, NP  06/13/2021, 9:23 AM    History and all data above reviewed.  Patient examined.  I agree with the findings as above.  The patient exam reveals COR:RRR, systolic soft brief murmur  ,  Lungs: No crackles  ,  Abd: Positive bowel sounds, no rebound no guarding, Ext Mild edema   .  All available labs, radiology testing, previous records reviewed. Agree with documented assessment and plan. CARDIOMYOPATHY:  Titrating meds.  Increased ARB yesterday.  Added Bidil which I will increase today.  HTN: This is being managed in the context of treating his CHF.  Not controlled.  I was trying to wean down the clonidine but likely wont be able to .  Abnormal echo:  For TEE today.    Jeneen Rinks Keiffer Piper  10:46 AM  06/13/2021

## 2021-06-13 NOTE — Procedures (Signed)
Pre-procedure Diagnosis: ESRD Post-procedure Diagnosis: Same  Successful placement of tunneled HD catheter with tips terminating within the superior aspect of the right atrium.    Complications: None Immediate EBL: Trace  The catheter is ready for immediate use.   Jay Hazem Kenner, MD Pager #: [phone]   

## 2021-06-13 NOTE — Anesthesia Preprocedure Evaluation (Addendum)
Anesthesia Evaluation  Patient identified by MRN, date of birth, ID band Patient awake    Reviewed: Allergy & Precautions, NPO status , Patient's Chart, lab work & pertinent test results, reviewed documented beta blocker date and time   History of Anesthesia Complications Negative for: history of anesthetic complications  Airway Mallampati: II  TM Distance: >3 FB Neck ROM: Full    Dental  (+) Dental Advisory Given   Pulmonary asthma , former smoker,    Pulmonary exam normal        Cardiovascular hypertension, Pt. on medications and Pt. on home beta blockers +CHF  Normal cardiovascular exam+ Valvular Problems/Murmurs MR    '22 TTE - EF 25 to 30%. Global hypokinesis. There is mild concentric left ventricular hypertrophy. Grade II diastolic dysfunction (pseudonormalization). Mildly increased right ventricular wall thickness. Left atrial size was severely dilated. Right atrial size was severely dilated. Moderate pericardial effusion. There is no evidence of cardiac  tamponade. Mild to moderate mitral valve regurgitation.  The tricuspid valve is abnormal- small 0.44 echodensity seen on the ventricular side of the tricuspid valve (Image 19/85. Tricuspid valve regurgitation is mild to moderate. Aortic valve regurgitation is mild.     Neuro/Psych PSYCHIATRIC DISORDERS Anxiety Depression Bipolar Disorder negative neurological ROS     GI/Hepatic negative GI ROS, Neg liver ROS,   Endo/Other  negative endocrine ROS  Renal/GU ESRF and DialysisRenal disease     Musculoskeletal negative musculoskeletal ROS (+)   Abdominal   Peds  Hematology  (+) anemia ,   Anesthesia Other Findings Lupus   Reproductive/Obstetrics                            Anesthesia Physical Anesthesia Plan  ASA: 4  Anesthesia Plan: MAC   Post-op Pain Management:    Induction:   PONV Risk Score and Plan: 2 and Propofol infusion  and Treatment may vary due to age or medical condition  Airway Management Planned: Nasal Cannula and Natural Airway  Additional Equipment: None  Intra-op Plan:   Post-operative Plan:   Informed Consent: I have reviewed the patients History and Physical, chart, labs and discussed the procedure including the risks, benefits and alternatives for the proposed anesthesia with the patient or authorized representative who has indicated his/her understanding and acceptance.       Plan Discussed with: CRNA and Anesthesiologist  Anesthesia Plan Comments:        Anesthesia Quick Evaluation

## 2021-06-13 NOTE — Progress Notes (Signed)
Echocardiogram Echocardiogram Transesophageal has been performed.   Oneal Deputy Edinson Domeier RDCS 06/13/2021, 2:19 PM

## 2021-06-13 NOTE — Plan of Care (Signed)

## 2021-06-13 NOTE — Sedation Documentation (Signed)
Pt moaning, crying in pain

## 2021-06-13 NOTE — Anesthesia Postprocedure Evaluation (Signed)
Anesthesia Post Note  Patient: Cathy [name]  Procedure(s) Performed: TRANSESOPHAGEAL ECHOCARDIOGRAM (TEE)     Patient location during evaluation: PACU Anesthesia Type: MAC Level of consciousness: awake and alert Pain management: pain level controlled Vital Signs Assessment: post-procedure vital signs reviewed and stable Respiratory status: spontaneous breathing, nonlabored ventilation and respiratory function stable Cardiovascular status: stable and blood pressure returned to baseline Anesthetic complications: no   No notable events documented.  Last Vitals:  Vitals:   06/13/21 1418 06/13/21 1428  BP: 126/90 (!) 136/94  Pulse: (!) 57 (!) 57  Resp: (!) 22 (!) 21  Temp: 37.1 C   SpO2: 100% 100%                  Audry Pili

## 2021-06-13 NOTE — Progress Notes (Signed)
[facility] Progress Note   Subjective:   Patient seen and examined at bedside in room.  New TDC placed by IR this AM. Reports tolerating procedure well, no pain.  Feeling ok, just tired and hungry.  Plan for TEE this afternoon.  Denies CP, SOB, n/v/d, abdominal pain, fever and chills.   Objective Vitals:   06/13/21 0840 06/13/21 0845 06/13/21 0852 06/13/21 0923  BP: (!) 175/131 (!) 176/135 (!) 172/131 (!) 170/123  Pulse: 81 80 85 69  Resp: 20 20 20 18   Temp:    98.5 F (36.9 C)  TempSrc:    Oral  SpO2: 100% 100% 96% 100%  Weight:       Physical Exam General:chronically ill appearing female in NAD Heart:RRR, no mrg Lungs:CTAB anterolaterally, nml WOB on 2L via [place] Abdomen:soft, NTND Extremities:1+ LE edema Dialysis Access: [facility]   Filed Weights   06/10/21 0530 06/10/21 1720  Weight: 66.3 kg 68 kg   No intake or output data in the 24 hours ending 06/13/21 1156  Additional Objective Labs: Basic Metabolic Panel: Recent Labs  Lab 06/10/21 0158 06/11/21 0208 06/12/21 0327 06/13/21 0957  NA 137 136 136 136  K 4.2 3.3* 4.3 3.3*  CL 101 97* 99 100  CO2 24 30 27 27   GLUCOSE 131* 106* 116* 100*  BUN 52* 14 29* 48*  CREATININE 9.52* 3.28* 4.65* 5.36*  CALCIUM 7.5* 8.0* 8.3* 7.7*  PHOS 6.0*  --   --  3.5   Liver Function Tests: Recent Labs  Lab 06/08/21 1430 06/09/21 0356 06/10/21 0158 06/13/21 0957  AST 31 37  --   --   ALT 16 17  --   --   ALKPHOS 156* 199*  --   --   BILITOT 0.6 0.6  --   --   PROT 5.8* 5.9*  --   --   ALBUMIN 2.3* 2.2* 2.1* 2.1*    CBC: Recent Labs  Lab 06/08/21 1430 06/08/21 1923 06/09/21 0356 06/10/21 0158 06/11/21 0208 06/12/21 0327 06/13/21 0957  WBC 6.7   < > 5.7 4.4 8.1 6.7 7.5  NEUTROABS 4.6  --   --  3.2  --   --   --   HGB 8.1*   < > 8.2* 8.2* 8.4* 8.2* 7.9*  HCT 27.5*   < > 27.0* 26.6* 27.0* 27.6* 25.0*  MCV 84.4   < > 83.1 82.1 83.1 83.4 81.7  PLT 233   < > 219 236 275 300 314   < > = values in  this interval not displayed.   Studies/Results: IR Fluoro Guide CV Line Right  Result Date: 06/13/2021 INDICATION: End-stage renal disease. Admitted with bacteremia post catheter holiday. Patient presents today for image guided placement of a new tunneled hemodialysis catheter. EXAM: TUNNELED CENTRAL VENOUS HEMODIALYSIS CATHETER PLACEMENT WITH ULTRASOUND AND FLUOROSCOPIC GUIDANCE MEDICATIONS: Ancef 2 gm IV. The antibiotic was given in an appropriate time interval prior to skin puncture. ANESTHESIA/SEDATION: Moderate (conscious) sedation was employed during this procedure. A total of Versed 2 mg and Fentanyl 100 mcg was administered intravenously. Moderate Sedation Time: 14 minutes. The patient's level of consciousness and vital signs were monitored continuously by radiology nursing throughout the procedure under my direct supervision. FLUOROSCOPY TIME:  36 seconds (3 mGy) COMPLICATIONS: None immediate. PROCEDURE: Informed written consent was obtained from the patient after a discussion of the risks, benefits, and alternatives to treatment. Questions regarding the procedure were encouraged and answered. The right neck and chest were  prepped with chlorhexidine in a sterile fashion, and a sterile drape was applied covering the operative field. Maximum barrier sterile technique with sterile gowns and gloves were used for the procedure. A timeout was performed prior to the initiation of the procedure. After creating a small venotomy incision, a micropuncture kit was utilized to access the internal jugular vein. Real-time ultrasound guidance was utilized for vascular access including the acquisition of a permanent ultrasound image documenting patency of the accessed vessel. The microwire was utilized to measure appropriate catheter length. A stiff Glidewire was advanced to the level of the IVC and the micropuncture sheath was exchanged for a peel-away sheath. A palindrome tunneled hemodialysis catheter measuring 23  cm from tip to cuff was tunneled in a retrograde fashion from the anterior chest wall to the venotomy incision. The catheter was then placed through the peel-away sheath with tips ultimately positioned within the superior aspect of the right atrium. Final catheter positioning was confirmed and documented with a spot radiographic image. The catheter aspirates and flushes normally. The catheter was flushed with appropriate volume heparin dwells. The catheter exit site was secured with a 0-Prolene retention suture. The venotomy incision was closed with Dermabond and Steri-strips. Dressings were applied. The patient tolerated the procedure well without immediate post procedural complication. IMPRESSION: Successful placement of 23 cm tip to cuff tunneled hemodialysis catheter via the right internal jugular vein with tips terminating within the superior aspect of the right atrium. The catheter is ready for immediate use. Electronically Signed   By: Sandi Mariscal M.D.   On: 06/13/2021 09:28   IR Removal Tun Cv Cath W/O FL  Result Date: 06/11/2021 INDICATION: Patient with history of lupus nephritis and previously placed right internal jugular tunneled HD catheter on 11/30/2020; she now presents with staph bacteremia and request received for catheter removal EXAM: REMOVAL TUNNELED CENTRAL VENOUS CATHETER MEDICATIONS: % lidocaine to skin and subcutaneous tissue ANESTHESIA/SEDATION: None FLUOROSCOPY TIME:  None COMPLICATIONS: None immediate. PROCEDURE: Informed written consent was obtained from the patient after a thorough discussion of the procedural risks, benefits and alternatives. All questions were addressed. Maximal Sterile Barrier Technique was utilized including caps, mask, sterile gowns, sterile gloves, sterile drape, hand hygiene and skin antiseptic. A timeout was performed prior to the initiation of the procedure. The patient's right chest and catheter was prepped and draped in a normal sterile fashion. Heparin  was removed from both ports of catheter. 1% lidocaine was used for local anesthesia. Using gentle blunt dissection the cuff of the catheter was exposed and the catheter was removed in it's entirety. Pressure was held till hemostasis was obtained. A sterile dressing was applied. The patient tolerated the procedure well with no immediate complications. IMPRESSION: Successful catheter removal as described above. Read by: Rowe Robert, PA-C Electronically Signed   By: Corrie Mckusick D.O.   On: 06/11/2021 10:16   IR US Guide Vasc Access Right  Result Date: 06/13/2021 INDICATION: End-stage renal disease. Admitted with bacteremia post catheter holiday. Patient presents today for image guided placement of a new tunneled hemodialysis catheter. EXAM: TUNNELED CENTRAL VENOUS HEMODIALYSIS CATHETER PLACEMENT WITH ULTRASOUND AND FLUOROSCOPIC GUIDANCE MEDICATIONS: Ancef 2 gm IV. The antibiotic was given in an appropriate time interval prior to skin puncture. ANESTHESIA/SEDATION: Moderate (conscious) sedation was employed during this procedure. A total of Versed 2 mg and Fentanyl 100 mcg was administered intravenously. Moderate Sedation Time: 14 minutes. The patient's level of consciousness and vital signs were monitored continuously by radiology nursing throughout the procedure under  my direct supervision. FLUOROSCOPY TIME:  36 seconds (3 mGy) COMPLICATIONS: None immediate. PROCEDURE: Informed written consent was obtained from the patient after a discussion of the risks, benefits, and alternatives to treatment. Questions regarding the procedure were encouraged and answered. The right neck and chest were prepped with chlorhexidine in a sterile fashion, and a sterile drape was applied covering the operative field. Maximum barrier sterile technique with sterile gowns and gloves were used for the procedure. A timeout was performed prior to the initiation of the procedure. After creating a small venotomy incision, a micropuncture  kit was utilized to access the internal jugular vein. Real-time ultrasound guidance was utilized for vascular access including the acquisition of a permanent ultrasound image documenting patency of the accessed vessel. The microwire was utilized to measure appropriate catheter length. A stiff Glidewire was advanced to the level of the IVC and the micropuncture sheath was exchanged for a peel-away sheath. A palindrome tunneled hemodialysis catheter measuring 23 cm from tip to cuff was tunneled in a retrograde fashion from the anterior chest wall to the venotomy incision. The catheter was then placed through the peel-away sheath with tips ultimately positioned within the superior aspect of the right atrium. Final catheter positioning was confirmed and documented with a spot radiographic image. The catheter aspirates and flushes normally. The catheter was flushed with appropriate volume heparin dwells. The catheter exit site was secured with a 0-Prolene retention suture. The venotomy incision was closed with Dermabond and Steri-strips. Dressings were applied. The patient tolerated the procedure well without immediate post procedural complication. IMPRESSION: Successful placement of 23 cm tip to cuff tunneled hemodialysis catheter via the right internal jugular vein with tips terminating within the superior aspect of the right atrium. The catheter is ready for immediate use. Electronically Signed   By: Sandi Mariscal M.D.   On: 06/13/2021 09:28    Medications:  sodium chloride     sodium chloride      ceFAZolin (ANCEF) IV 1 g (06/12/21 1640)    (feeding supplement) PROSource Plus  30 mL Oral TID BM   calcium acetate  1,334 mg Oral TID WC   carvedilol  25 mg Oral BID WC   Chlorhexidine Gluconate Cloth  6 each Topical Q0600   Chlorhexidine Gluconate Cloth  6 each Topical Q0600   Chlorhexidine Gluconate Cloth  6 each Topical Q0600   clobetasol cream   Topical BID   cloNIDine  0.2 mg Oral TID   darbepoetin  (ARANESP) injection - DIALYSIS  150 mcg Intravenous Q Thu-HD   doxercalciferol  5 mcg Intravenous Q M,W,F-HD   fentaNYL       heparin  5,000 Units Subcutaneous Q8H   heparin sodium (porcine)       irbesartan  150 mg Oral Daily   isosorbide-hydrALAZINE  2 tablet Oral TID   lidocaine-EPINEPHrine       midazolam       pantoprazole  40 mg Oral Daily   predniSONE  40 mg Oral Q breakfast   torsemide  60 mg Oral BID   triamcinolone   Topical BID    Dialysis Orders: TTS - [facility]  4hrs, BFR 400, DFR 800,  EDW 67kg, 2K/ 2Ca   Mircera 200 mcg q2wks - last 05/28/21 Hectorol 69mg IV qHD - last 05/30/21 Venofer 530mIV weekly - last 05/28/21 Calcium Acetate 66729m tabs TID with meals   Last Labs: Hgb 8.2, TSAT 11 (OP HD 05/07/21), K 4.0, Ca 7.6, Alb 2.2  Assessment/Plan: MSSA Bacteremia: Blood Cx 7/7 (+) MSSA sensitive to oxacillin. Blood cultures X 2 were repeated 7/9 with NGTD. ID recommend line holiday and continue cefazolin. TDC removed 7/12 and replaced this AM. Small echodensity noted on TV, plan for TEE today at 1400.   Lupus Flare/Systemic Rash: Continue Prednisone New Onset HFrEF: ECHO 7/9 showed worsened EF 25-30%, G2DD, moderate pleural effusion-no evidence of cardiac tamponade, and a small echodensity on TV. Cardiology following.  ESRD - on HD TTS. IR removed [facility] 7/12 and replaced this AM.  Appreciate their assistance.  Plan for dialysis to follow TEE. Hypertension/volume  - Blood pressure remains elevated. Medications being managed by cardiology. No change in LE edema.  Reiterated the importance of fluid restriction.UF as tolerated.  Anemia of CKD - Hgb 7.9 this AM.  To receive Aranesp 178mg with HD today.  Secondary Hyperparathyroidism - Corr Ca and phos in goal.  Continue VDRA and binders. Nutrition - Albumin low 2.2-will add protein supplements. Renal diet w/fluid restrictions.  Pericardial effusion - noted on TTE, Cardiology to reassess with TEE today.    LJen Mow PA-C [facility] 06/13/2021,11:56 AM  LOS: 5 days

## 2021-06-13 NOTE — Transfer of Care (Signed)
Immediate Anesthesia Transfer of Care Note  Patient: Cathy [name]  Procedure(s) Performed: TRANSESOPHAGEAL ECHOCARDIOGRAM (TEE)  Patient Location: Endoscopy Unit  Anesthesia Type:MAC  Level of Consciousness: awake and drowsy  Airway & Oxygen Therapy: Patient Spontanous Breathing and Patient connected to nasal cannula oxygen  Post-op Assessment: Report given to RN and Post -op Vital signs reviewed and stable  Post vital signs: Reviewed and stable  Last Vitals:  Vitals Value Taken Time  BP    Temp    Pulse    Resp    SpO2      Last Pain:  Vitals:   06/13/21 1234  TempSrc:   PainSc: 10-Worst pain ever      Patients Stated Pain Goal: 0 (51/[id number]/41 [id number])  Complications: No notable events documented.

## 2021-06-13 NOTE — Sedation Documentation (Signed)
Vital signs stable. 

## 2021-06-13 NOTE — Anesthesia Procedure Notes (Signed)
Procedure Name: MAC Date/Time: 06/13/2021 1:35 PM Performed by: Inda Coke, CRNA Pre-anesthesia Checklist: Patient identified, Emergency Drugs available, Suction available, Timeout performed and Patient being monitored Patient Re-evaluated:Patient Re-evaluated prior to induction Oxygen Delivery Method: Nasal cannula Induction Type: IV induction Dental Injury: Teeth and Oropharynx as per pre-operative assessment

## 2021-06-13 NOTE — CV Procedure (Signed)
TEE:  No SBE/Vegetations TV moderate MR AV mild AR MV mild MR EF 25-30% global hypokinesis  Moderate pericardial effusion no tamponade New dialysis catheter tip seen in RA No LAA thrombus  No ASD  I reviewed his previous TTE and did not feel he had TV had vegetation on that study either  Jenkins Rouge MD [facility]

## 2021-06-13 NOTE — H&P (Signed)
Chief Complaint: Patient was seen in consultation today for image guided tunneled HD catheter placement at the request of Jen Mow PA-C  Referring Physician(s): Jen Mow PA-C  Supervising Physician: Sandi Mariscal  Patient Status: [facility] - In-pt  History of Present Illness: Cathy [name] is a 40 y.o. female known to IR service for previous tunneled catheter placement and removal due to bacteremia.  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 Patient is scheduled for hemodialysis later today , nephrology requested tunneled HD catheter placement in IR.  Patient blood culture has grown no gross for 4 days but the report has not been finalized, confirmed by ID it is okay to proceed with tunneled HD catheter placement.  Patient was brought down to [facility] this morning for the procedure. Patient laying in bed, not in acute distress.  Denise headache, fever, chills, shortness of breath, cough, chest pain, abdominal pain, nausea ,vomiting, and bleeding.    Past Medical History:  Diagnosis Date   Anasarca associated with disorder of kidney 06/08/2021   Anxiety    Asthma    Bipolar depression ([place])    Depression    ESRD (end stage renal disease) on dialysis ([place]) 10/2020   Gonorrhea    Lupus (HCC)    Pelvic inflammatory disease (PID)    Pleural effusion 06/08/2021   Renal hypertension    Trichomonas infection     Past Surgical History:  Procedure Laterality Date   IR FLUORO GUIDE CV LINE RIGHT  11/30/2020   IR REMOVAL TUN CV CATH W/O FL  06/11/2021   IR US GUIDE VASC ACCESS RIGHT  11/30/2020   RENAL BIOPSY     TUBAL LIGATION N/A 02/03/2019   Procedure: POST PARTUM TUBAL LIGATION;  Surgeon: Aletha Halim, MD;  Location: [facility] LD ORS;  Service: Gynecology;  Laterality: N/A;    Allergies: Patient has no known allergies.  Medications: Prior to Admission  medications   Medication Sig Start Date End Date Taking? Authorizing Provider  acetaminophen (TYLENOL) 500 MG tablet Take 1,000 mg by mouth every 6 (six) hours as needed for moderate pain.   Yes [provider]  albuterol (VENTOLIN HFA) 108 (90 Base) MCG/ACT inhaler INHALE 1-2 PUFFS INTO THE LUNGS 2 (TWO) TIMES DAILY. Patient taking differently: Inhale 2 puffs into the lungs 2 (two) times daily. 12/31/20 12/31/21 Yes Regalado, Belkys A, MD  amLODipine (NORVASC) 10 MG tablet TAKE 1 TABLET (10 MG TOTAL) BY MOUTH DAILY. Patient taking differently: Take 10 mg by mouth daily. 12/31/20 12/31/21 Yes Regalado, Belkys A, MD  cloNIDine (CATAPRES) 0.3 MG tablet TAKE 1 TABLET (0.3 MG TOTAL) BY MOUTH 3 (THREE) TIMES DAILY. Patient taking differently: Take 0.3 mg by mouth 3 (three) times daily. 12/31/20 12/31/21 Yes Regalado, Belkys A, MD  ipratropium (ATROVENT HFA) 17 MCG/ACT inhaler INHALE 2 PUFFS INTO THE LUNGS EVERY 6 (SIX) HOURS. Patient taking differently: Inhale 2 puffs into the lungs every 6 (six) hours as needed for wheezing. 12/31/20 12/31/21 Yes Regalado, Belkys A, MD  labetalol (NORMODYNE) 200 MG tablet TAKE 2 TABLETS (400 MG TOTAL) BY MOUTH 2 (TWO) TIMES DAILY. Patient taking differently: Take 400 mg by mouth 2 (two) times daily. 12/31/20 12/31/21 Yes Regalado, Belkys A, MD  pantoprazole (PROTONIX) 40 MG tablet TAKE 1 TABLET (40 MG TOTAL) BY MOUTH DAILY. Patient taking differently: Take 40 mg by mouth daily. 12/31/20 12/31/21 Yes Regalado, [employer]  A, MD  torsemide (DEMADEX) 20 MG tablet TAKE 3 TABLETS (60 MG TOTAL) BY MOUTH 2 (TWO) TIMES DAILY. Patient taking differently: Take 60 mg by mouth 2 (two) times daily. 12/31/20 12/31/21 Yes Regalado, Belkys A, MD  Cyanocobalamin (B-12) 1000 MCG TABS TAKE 1 TABLET (100 MCG TOTAL) BY MOUTH DAILY. Patient not taking: No sig reported 12/31/20 12/31/21  Regalado, Jerald Kief A, MD  cyanocobalamin 100 MCG tablet Take 1 tablet (100 mcg total) by mouth daily. Patient not  taking: No sig reported 12/31/20   Regalado, Belkys A, MD  doxycycline (VIBRAMYCIN) 100 MG capsule Take 1 capsule (100 mg total) by mouth 2 (two) times daily for 10 days. 06/06/21 06/16/21  Wyvonnia Dusky, MD  folic acid (FOLVITE) 1 MG tablet TAKE 1 TABLET (1 MG TOTAL) BY MOUTH DAILY. Patient not taking: No sig reported 12/31/20 12/31/21  Regalado, Belkys A, MD  guaiFENesin (MUCINEX) 600 MG 12 hr tablet TAKE 1 TABLET (600 MG TOTAL) BY MOUTH 2 (TWO) TIMES DAILY. Patient not taking: No sig reported 12/31/20 12/31/21  Regalado, Jerald Kief A, MD  olmesartan (BENICAR) 40 MG tablet Take 1 tablet (40 mg total) by mouth daily. Patient not taking: No sig reported 01/20/21   Mariel Aloe, MD  predniSONE (DELTASONE) 10 MG tablet Take 6 tablets (60 mg total) by mouth daily with breakfast for 4 days, THEN 4 tablets (40 mg total) daily with breakfast for 4 days, THEN 2 tablets (20 mg total) daily with breakfast for 4 days, THEN 1 tablet (10 mg total) daily with breakfast for 4 days. 06/07/21 06/23/21  Wyvonnia Dusky, MD  predniSONE (DELTASONE) 20 MG tablet TAKE 2 TABLETS (40 MG TOTAL) BY MOUTH DAILY WITH BREAKFAST. Patient not taking: No sig reported 12/31/20 12/31/21  Regalado, Jerald Kief A, MD  Vitamin D3 (VITAMIN D) 25 MCG tablet TAKE 1 TABLET (1,000 UNITS TOTAL) BY MOUTH DAILY. Patient not taking: No sig reported 12/31/20 12/31/21  Regalado, Jerald Kief A, MD  fenofibrate 160 MG tablet Take 1 tablet (160 mg total) by mouth daily. 12/31/20 01/20/21  Elmarie Shiley, MD     Family History  Problem Relation Age of Onset   Diabetes Maternal Grandmother    Hypertension Maternal Grandmother    Diabetes Maternal Grandfather    Hypertension Maternal Grandfather    Diabetes Paternal Grandmother    Hypertension Paternal Grandmother    Diabetes Paternal Grandfather    Hypertension Paternal Grandfather     Social History   Socioeconomic History   Marital status: Single    Spouse name: Not on file   Number of children: Not  on file   Years of education: Not on file   Highest education level: Not on file  Occupational History   Not on file  Tobacco Use   Smoking status: Former    Packs/day: 0.25    Types: Cigarettes    Quit date: 10/08/2020    Years since quitting: 0.6   Smokeless tobacco: Never  Vaping Use   Vaping Use: Never used  Substance and Sexual Activity   Alcohol use: Not Currently   Drug use: Not Currently    Types: Marijuana    Comment: 3 times a week    Sexual activity: Not Currently    Birth control/protection: None  Other Topics Concern   Not on file  Social History Narrative   Not on file   Social Determinants of Health   Financial Resource Strain: Not on file  Food Insecurity: Not on file  Transportation  Needs: Not on file  Physical Activity: Not on file  Stress: Not on file  Social Connections: Not on file     Review of Systems: A 12 point ROS discussed and pertinent positives are indicated in the HPI above.  All other systems are negative.   Vital Signs: BP (!) 171/133 (BP Location: Left Arm)   Pulse 96   Temp 98.1 F (36.7 C) (Oral)   Resp 20   Wt 149 lb 14.6 oz (68 kg)   SpO2 96%   BMI 23.48 kg/m   Physical Exam  Vitals and nursing note reviewed.  Constitutional:      General: He is not in acute distress.    Appearance: Normal appearance.  HENT:     Head: Normocephalic and atraumatic.     Mouth/Throat:     Mouth: Mucous membranes are moist.     Pharynx: Oropharynx is clear.  Cardiovascular:     Rate and Rhythm: Normal rate and regular rhythm.     Pulses: Normal pulses.     Heart sounds: Normal heart sounds.  Pulmonary:     Effort: Pulmonary effort is normal.     Breath sounds: Normal breath sounds. No wheezing, rhonchi or rales.  Abdominal:     General: Bowel sounds are normal. There is no distension.     Palpations: Abdomen is soft.  Skin:    General: Skin is warm and dry.  Neurological:     Mental Status: He is alert and oriented to person,  place, and time.  Psychiatric:        Mood and Affect: Mood normal.        Behavior: Behavior normal.     MD Evaluation Airway: WNL Heart: WNL Abdomen: WNL Chest/ Lungs: WNL ASA  Classification: 3 Mallampati/Airway Score: Two  Imaging: IR Removal Tun Cv Cath W/O FL  Result Date: 06/11/2021 INDICATION: Patient with history of lupus nephritis and previously placed right internal jugular tunneled HD catheter on 11/30/2020; she now presents with staph bacteremia and request received for catheter removal EXAM: REMOVAL TUNNELED CENTRAL VENOUS CATHETER MEDICATIONS: % lidocaine to skin and subcutaneous tissue ANESTHESIA/SEDATION: None FLUOROSCOPY TIME:  None COMPLICATIONS: None immediate. PROCEDURE: Informed written consent was obtained from the patient after a thorough discussion of the procedural risks, benefits and alternatives. All questions were addressed. Maximal Sterile Barrier Technique was utilized including caps, mask, sterile gowns, sterile gloves, sterile drape, hand hygiene and skin antiseptic. A timeout was performed prior to the initiation of the procedure. The patient's right chest and catheter was prepped and draped in a normal sterile fashion. Heparin was removed from both ports of catheter. 1% lidocaine was used for local anesthesia. Using gentle blunt dissection the cuff of the catheter was exposed and the catheter was removed in it's entirety. Pressure was held till hemostasis was obtained. A sterile dressing was applied. The patient tolerated the procedure well with no immediate complications. IMPRESSION: Successful catheter removal as described above. Read by: Rowe Robert, PA-C Electronically Signed   By: Corrie Mckusick D.O.   On: 06/11/2021 10:16   DG CHEST PORT 1 VIEW  Result Date: 06/11/2021 CLINICAL DATA:  Dyspnea.  Chest pain. EXAM: PORTABLE CHEST 1 VIEW COMPARISON:  06/06/2021 FINDINGS: Normal heart size scratch set stable cardiomediastinal contours. Persistent bilateral  pleural effusions, left greater than right. Mild interstitial edema. No airspace consolidation. IMPRESSION: Persistent bilateral pleural effusions, left greater than right. Mild interstitial edema. Electronically Signed   By: Queen Slough.D.  On: 06/11/2021 13:39   DG Chest Port 1 View  Result Date: 06/06/2021 CLINICAL DATA:  Questionable sepsis EXAM: PORTABLE CHEST 1 VIEW COMPARISON:  01/18/2021 FINDINGS: Chronic cardiopericardial enlargement. Hazy opacity at the bases which includes small volume pleural fluid. Dialysis catheter with tip at the right atrium. No pneumothorax. IMPRESSION: Small pleural effusions with right more than left pulmonary opacity at the bases. Atelectasis or pneumonia could be present at the right base. Electronically Signed   By: Monte Fantasia M.D.   On: 06/06/2021 10:07   VAS [place] UPPER EXTREMITY ARTERIAL DUPLEX  Result Date: 06/05/2021  UPPER EXTREMITY DUPLEX STUDY Patient Name:  Cathy [name]  Date of Exam:   06/05/2021 Medical Rec #: [id number]         Accession #:    [id number] Date of Birth: 12/14/1980        Patient Gender: F Patient Age:   7Y Exam Location:  [facility] Procedure:      VAS [place] UPPER EXTREMITY ARTERIAL DUPLEX Referring Phys: [id number] Waynetta Sandy --------------------------------------------------------------------------------  Indications: New access needed. History:     Patient has a history of ESRD.  Performing Technologist: Ronal Fear RVS, RCS  Examination Guidelines: A complete evaluation includes B-mode imaging, spectral Doppler, color Doppler, and power Doppler as needed of all accessible portions of each vessel. Bilateral testing is considered an integral part of a complete examination. Limited examinations for reoccurring indications may be performed as noted.  Right Pre-Dialysis Findings: +-----------------------+----------+--------------------+---------+--------+ Location               PSV (cm/s)Intralum.  Diam. (cm)Waveform Comments +-----------------------+----------+--------------------+---------+--------+ Brachial Antecub. fossa59        0.37                triphasic         +-----------------------+----------+--------------------+---------+--------+ Radial Art at Wrist    24        0.18                biphasic          +-----------------------+----------+--------------------+---------+--------+ Ulnar Art at Wrist     29        0.16                biphasic          +-----------------------+----------+--------------------+---------+--------+  Left Pre-Dialysis Findings: +-----------------------+----------+--------------------+---------+--------+ Location               PSV (cm/s)Intralum. Diam. (cm)Waveform Comments +-----------------------+----------+--------------------+---------+--------+ Brachial Antecub. fossa58        0.39                triphasic         +-----------------------+----------+--------------------+---------+--------+ Radial Art at Wrist    65        0.18                triphasic         +-----------------------+----------+--------------------+---------+--------+ Ulnar Art at Wrist     29        0.15                triphasic         +-----------------------+----------+--------------------+---------+--------+  Summary:  Right: No obstruction visualized in the right upper extremity. Left: No obstruction visualized in the left upper extremity. *See table(s) above for measurements and observations. Electronically signed by Deitra Mayo MD on 06/05/2021 at 1:18:38 PM.    Final    ECHOCARDIOGRAM COMPLETE  Result Date: 06/08/2021  ECHOCARDIOGRAM REPORT   Patient Name:   Cathy [name] Date of Exam: 06/08/2021 Medical Rec #:  [id number]        Height:       67.0 in Accession #:    [id number]       Weight:       147.0 lb Date of Birth:  October 27, 1981       BSA:          1.774 m Patient Age:    39 years         BP:           155/119 mmHg Patient Gender: F                 HR:           70 bpm. Exam Location:  Inpatient Procedure: 2D Echo, Cardiac Doppler and Color Doppler Indications:     Endocarditis  History:         Patient has prior history of Echocardiogram examinations, most                  recent 11/15/2020. Risk Factors:Former Smoker. Substance abuse.                  Anasarca. Lupus. Moderate pericardial effusion.  Sonographer:     Merrie Roof RDCS Referring Phys:  [id number] Janeece Fitting Diagnosing Phys: Rudean Haskell MD IMPRESSIONS  1. Left ventricular ejection fraction, by estimation, is 25 to 30%. The left ventricle has severely decreased function. The left ventricle demonstrates global hypokinesis. There is mild concentric left ventricular hypertrophy. Left ventricular diastolic  parameters are consistent with Grade II diastolic dysfunction (pseudonormalization).  2. Right ventricular systolic function is normal. The right ventricular size is normal. Mildly increased right ventricular wall thickness. There is normal pulmonary artery systolic pressure.  3. Left atrial size was severely dilated.  4. Right atrial size was severely dilated.  5. Moderate pericardial effusion. There is no evidence of cardiac tamponade.  6. The mitral valve is grossly normal. Mild to moderate mitral valve regurgitation.  7. The tricuspid valve is abnormal- small 0.44 echodensity seen on the ventricular side of the tricuspid valve (Image 19/85. Tricuspid valve regurgitation is mild to moderate.  8. The aortic valve is tricuspid. Aortic valve regurgitation is mild. No aortic stenosis is present. Comparison(s): A prior study was performed on 11/15/2020. Prior images reviewed side by side. LVEF is significantly worse. Small echo density on tricuspid valve was only seen in PSAX axis (not well visualized in prior study). Conclusion(s)/Recommendation(s): TEE could be considered given the clinical question of endocarditis. Addendum: Primary team updated. FINDINGS  Left Ventricle:  Left ventricular ejection fraction, by estimation, is 25 to 30%. The left ventricle has severely decreased function. The left ventricle demonstrates global hypokinesis. The left ventricular internal cavity size was normal in size. There is mild concentric left ventricular hypertrophy. Left ventricular diastolic parameters are consistent with Grade II diastolic dysfunction (pseudonormalization). Right Ventricle: The right ventricular size is normal. Mildly increased right ventricular wall thickness. Right ventricular systolic function is normal. There is normal pulmonary artery systolic pressure. The tricuspid regurgitant velocity is 2.71 m/s, and with an assumed right atrial pressure of 3 mmHg, the estimated right ventricular systolic pressure is [id number] mmHg. Left Atrium: Left atrial size was severely dilated. Right Atrium: Right atrial size was severely dilated. Pericardium: A moderately sized pericardial effusion is present. There is no evidence of cardiac tamponade. Mitral Valve: The mitral valve is  grossly normal. Mild to moderate mitral valve regurgitation. Tricuspid Valve: Small 0.44 echodensity seen on the ventricular side of the tricuspid valve (Image 19/85). The tricuspid valve is abnormal. Tricuspid valve regurgitation is mild to moderate. No evidence of tricuspid stenosis. Aortic Valve: The aortic valve is tricuspid. Aortic valve regurgitation is mild. No aortic stenosis is present. Aortic valve mean gradient measures 5.0 mmHg. Aortic valve peak gradient measures 8.3 mmHg. Aortic valve area, by VTI measures 2.08 cm. Pulmonic Valve: The pulmonic valve was normal in structure. Pulmonic valve regurgitation is mild. No evidence of pulmonic stenosis. Aorta: The aortic root and ascending aorta are structurally normal, with no evidence of dilitation. IAS/Shunts: The atrial septum is grossly normal.  LEFT VENTRICLE PLAX 2D LVIDd:         5.40 cm  Diastology LVIDs:         4.70 cm  LV e' medial:    6.64 cm/s LV PW:          1.20 cm  LV E/e' medial:  17.2 LV IVS:        1.10 cm  LV e' lateral:   7.51 cm/s LVOT diam:     1.90 cm  LV E/e' lateral: 15.2 LV SV:         62 LV SV Index:   35 LVOT Area:     2.84 cm  RIGHT VENTRICLE             IVC RV Basal diam:  5.30 cm     IVC diam: 2.00 cm RV Mid diam:    3.40 cm RV S prime:     15.10 cm/s TAPSE (M-mode): 2.8 cm LEFT ATRIUM            Index       RIGHT ATRIUM           Index LA diam:      4.60 cm  2.59 cm/m  RA Area:     28.60 cm LA Vol (A2C): 97.2 ml  54.78 ml/m RA Volume:   111.00 ml 62.55 ml/m LA Vol (A4C): 116.0 ml 65.37 ml/m  AORTIC VALVE AV Area (Vmax):    2.42 cm AV Area (Vmean):   2.07 cm AV Area (VTI):     2.08 cm AV Vmax:           144.00 cm/s AV Vmean:          99.200 cm/s AV VTI:            0.299 m AV Peak Grad:      8.3 mmHg AV Mean Grad:      5.0 mmHg LVOT Vmax:         123.00 cm/s LVOT Vmean:        72.300 cm/s LVOT VTI:          0.219 m LVOT/AV VTI ratio: 0.73  AORTA Ao Root diam: 3.30 cm Ao Asc diam:  3.40 cm MITRAL VALVE                TRICUSPID VALVE MV Area (PHT): 4.80 cm     TR Peak grad:   29.4 mmHg MV Decel Time: 158 msec     TR Vmax:        271.00 cm/s MV E velocity: 114.00 cm/s MV A velocity: 69.20 cm/s   SHUNTS MV E/A ratio:  1.65         Systemic VTI:  0.22 m  Systemic Diam: 1.90 cm Rudean Haskell MD Electronically signed by Rudean Haskell MD Signature Date/Time: 06/08/2021/5:41:40 PM    Final (Updated)    VAS [place] UPPER EXTREMITY VEIN MAPPING  Result Date: 06/05/2021 UPPER EXTREMITY VEIN MAPPING Patient Name:  Cathy [name]  Date of Exam:   06/05/2021 Medical Rec #: [id number]         Accession #:    [id number] Date of Birth: 06/30/1981        Patient Gender: F Patient Age:   89Y Exam Location:  [facility] Procedure:      VAS [place] UPPER EXT VEIN MAPPING (PRE-OP AVF) Referring Phys: [id number] [place]  --------------------------------------------------------------------------------  Indications: Pre-access. History: ESRD.  Performing Technologist: Ronal Fear RVS, RCS  Examination Guidelines: A complete evaluation includes B-mode imaging, spectral Doppler, color Doppler, and power Doppler as needed of all accessible portions of each vessel. Bilateral testing is considered an integral part of a complete examination. Limited examinations for reoccurring indications may be performed as noted. +-----------------+-------------+----------+--------+ Right Cephalic   Diameter (cm)Depth (cm)Findings +-----------------+-------------+----------+--------+ Shoulder             0.25                        +-----------------+-------------+----------+--------+ Prox upper arm       0.21                        +-----------------+-------------+----------+--------+ Mid upper arm        0.19                        +-----------------+-------------+----------+--------+ Dist upper arm       0.18                        +-----------------+-------------+----------+--------+ Antecubital fossa    0.22                        +-----------------+-------------+----------+--------+ Prox forearm                            thrombus +-----------------+-------------+----------+--------+ Mid forearm                             thrombus +-----------------+-------------+----------+--------+ Dist forearm                            thrombus +-----------------+-------------+----------+--------+ +-----------------+-------------+----------+--------+ Right Basilic    Diameter (cm)Depth (cm)Findings +-----------------+-------------+----------+--------+ Mid upper arm        0.16                        +-----------------+-------------+----------+--------+ Dist upper arm       0.18                        +-----------------+-------------+----------+--------+ Antecubital fossa    0.17                         +-----------------+-------------+----------+--------+ +-----------------+-------------+----------+--------+ Left Cephalic    Diameter (cm)Depth (cm)Findings +-----------------+-------------+----------+--------+ Shoulder             0.18                        +-----------------+-------------+----------+--------+  Prox upper arm       0.16                        +-----------------+-------------+----------+--------+ Mid upper arm        0.21                        +-----------------+-------------+----------+--------+ Dist upper arm       0.19                        +-----------------+-------------+----------+--------+ Antecubital fossa    0.28                        +-----------------+-------------+----------+--------+ Prox forearm         0.16                        +-----------------+-------------+----------+--------+ Mid forearm          0.11                        +-----------------+-------------+----------+--------+ +-----------------+-------------+----------+--------+ Left Basilic     Diameter (cm)Depth (cm)Findings +-----------------+-------------+----------+--------+ Mid upper arm        0.18                        +-----------------+-------------+----------+--------+ Dist upper arm       0.16                        +-----------------+-------------+----------+--------+ Antecubital fossa    0.15                        +-----------------+-------------+----------+--------+ Summary: Right: Patent and compressible basilic vein.        The cephalic vein is thrombosed in the forearm. Left: Patent and compressible cephalic and basilic veins. *See table(s) above for measurements and observations.  Diagnosing physician: Deitra Mayo MD Electronically signed by Deitra Mayo MD on 06/05/2021 at 1:54:47 PM.    Final     Labs:  CBC: Recent Labs    06/09/21 0356 06/10/21 0158 06/11/21 0208 06/12/21 0327  WBC 5.7 4.4 8.1 6.7  HGB 8.2*  8.2* 8.4* 8.2*  HCT 27.0* 26.6* 27.0* 27.6*  PLT 219 236 275 300    COAGS: Recent Labs    12/03/20 0156 06/06/21 1035 06/08/21 1923  INR 1.0 1.1 1.0  APTT  --  30 28    BMP: Recent Labs    08/07/20 1209 10/17/20 1015 06/09/21 0356 06/10/21 0158 06/11/21 0208 06/12/21 0327  NA 138   < > 138 137 136 136  K 3.6   < > 4.0 4.2 3.3* 4.3  CL 109   < > 102 101 97* 99  CO2 21*   < > '23 24 30 27  '$ GLUCOSE 87   < > 168* 131* 106* 116*  BUN 9   < > 40* 52* 14 29*  CALCIUM 8.4*   < > 7.6* 7.5* 8.0* 8.3*  CREATININE 0.80   < > 9.58* 9.52* 3.28* 4.65*  GFRNONAA >60   < > 5* 5* 18* 12*  GFRAA >60  --   --   --   --   --    < > = values in this interval not displayed.    LIVER FUNCTION TESTS: Recent  Labs    01/18/21 1952 06/06/21 1035 06/08/21 1430 06/09/21 0356 06/10/21 0158  BILITOT 0.8 1.1 0.6 0.6  --   AST 151* 24 31 37  --   ALT 109* '12 16 17  '$ --   ALKPHOS 187* 140* 156* 199*  --   PROT 5.7* 5.3* 5.8* 5.9*  --   ALBUMIN 2.5* 2.1* 2.3* 2.2* 2.1*    TUMOR MARKERS: No results for input(s): AFPTM, CEA, CA199, CHROMGRNA in the last 8760 hours.  Assessment and Plan: 40 y.o. 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  Patient had a previous tunneled hemodialysis catheter which was removed in 06/11/2021 for a line holiday.  Nephrology has asked IR to place a tunneled hemodialysis catheter.  Confirmed with ID as blood culture result has not been finalized, okay to proceed.  Patient was brought down to [facility] IR today for the procedure. N.p.o. since midnight VS HTN at baseline 171/133 today, afebrile  CBC yesterday showed no leukocytosis   Risks and benefits discussed with the patient including, but not limited to bleeding, infection, vascular injury, pneumothorax which may require chest tube placement, air embolism or even death  All of the patient's questions were answered, patient is agreeable to proceed. Consent signed and in  chart.   Thank you for this interesting consult.  I greatly enjoyed meeting Cathy [name] and look forward to participating in their care.  A copy of this report was sent to the requesting provider on this date.  Electronically Signed: Tera Mater, PA-C 06/13/2021, 8:12 AM   I spent a total of 20 Minutes    in face to face in clinical consultation, greater than 50% of which was counseling/coordinating care for tunneled HD catheter placement.

## 2021-06-13 NOTE — Interval H&P Note (Signed)
History and Physical Interval Note:  06/13/2021 12:47 PM  Cathy [name]  has presented today for surgery, with the diagnosis of BACTEREMIA.  The various methods of treatment have been discussed with the patient and family. After consideration of risks, benefits and other options for treatment, the patient has consented to  Procedure(s): TRANSESOPHAGEAL ECHOCARDIOGRAM (TEE) (N/A) as a surgical intervention.  The patient's history has been reviewed, patient examined, no change in status, stable for surgery.  I have reviewed the patient's chart and labs.  Questions were answered to the patient's satisfaction.     Jenkins Rouge

## 2021-06-13 NOTE — Sedation Documentation (Signed)
Assisted pt to IR suite. Pt moved self to IR table without difficulty. Pt denies pain at this time. Pt states that she is cold. Pt given warm blankets. Pt being prepped for procedure at this time.

## 2021-06-13 NOTE — Sedation Documentation (Signed)
Vital signs stable. Procedure started 

## 2021-06-13 NOTE — Progress Notes (Signed)
PROGRESS NOTE    Cathy [name]  EXH:[id number] DOB: [birth year] DOA: 06/08/2021 PCP: Pcp, No    Brief Narrative:  40 year old female with history of lupus, ESRD on HD TTS since January 2022, asthma, HTN, comes into the hospital after being called at home that her blood cultures were positive.  On 7/7 she went to dialysis, had a fever of 102, new onset rash to her face and legs and was sent to the ED.  She was diagnosed with a lupus flare, was placed on steroids as well as doxycycline and sent home.  Blood cultures were obtained during that ED visit, and returned positive for MSSA.  Patient was called and was admitted to the hospital on 7/9.  Tunneled central line was removed on 7/12 after receiving 2 sessions of hemodialysis  7/14-patient is sleepy.  She just returned from placement of tunneled HD catheter by IR.  Consultants:  ID, cardiology, nephrology, IR  Procedures:   Antimicrobials:  Cefazolin   Subjective: She barely opens eyes when I ask her questions.  No overnight issues.  Objective: Vitals:   06/13/21 0840 06/13/21 0845 06/13/21 0852 06/13/21 0923  BP: (!) 175/131 (!) 176/135 (!) 172/131 (!) 170/123  Pulse: 81 80 85 69  Resp: 20 20 20 18   Temp:    98.5 F (36.9 C)  TempSrc:    Oral  SpO2: 100% 100% 96% 100%  Weight:        Intake/Output Summary (Last 24 hours) at 06/13/2021 1232 Last data filed at 06/13/2021 1221 Gross per 24 hour  Intake --  Output 800 ml  Net -800 ml   Filed Weights   06/10/21 0530 06/10/21 1720  Weight: 66.3 kg 68 kg    Examination: Calm sleepy Anteriorly CTA no wheezing Regular S1-S2 no gallops Soft benign positive bowel sounds No edema Unable to assess CNS due to patient being sleepy    Data Reviewed: I have personally reviewed following labs and imaging studies  CBC: Recent Labs  Lab 06/08/21 1430 06/08/21 1923 06/09/21 0356 06/10/21 0158 06/11/21 0208 06/12/21 0327 06/13/21 0957  WBC 6.7   < > 5.7 4.4 8.1 6.7  7.5  NEUTROABS 4.6  --   --  3.2  --   --   --   HGB 8.1*   < > 8.2* 8.2* 8.4* 8.2* 7.9*  HCT 27.5*   < > 27.0* 26.6* 27.0* 27.6* 25.0*  MCV 84.4   < > 83.1 82.1 83.1 83.4 81.7  PLT 233   < > 219 236 275 300 314   < > = values in this interval not displayed.   Basic Metabolic Panel: Recent Labs  Lab 06/09/21 0356 06/10/21 0158 06/11/21 0208 06/12/21 0327 06/13/21 0957  NA 138 137 136 136 136  K 4.0 4.2 3.3* 4.3 3.3*  CL 102 101 97* 99 100  CO2 23 24 30 27 27   GLUCOSE 168* 131* 106* 116* 100*  BUN 40* 52* 14 29* 48*  CREATININE 9.58* 9.52* 3.28* 4.65* 5.36*  CALCIUM 7.6* 7.5* 8.0* 8.3* 7.7*  MG 1.7  --   --   --   --   PHOS  --  6.0*  --   --  3.5   GFR: Estimated Creatinine Clearance: 14 mL/min (A) (by C-G formula based on SCr of 5.36 mg/dL (H)). Liver Function Tests: Recent Labs  Lab 06/08/21 1430 06/09/21 0356 06/10/21 0158 06/13/21 0957  AST 31 37  --   --   ALT 16 17  --   --  ALKPHOS 156* 199*  --   --   BILITOT 0.6 0.6  --   --   PROT 5.8* 5.9*  --   --   ALBUMIN 2.3* 2.2* 2.1* 2.1*   No results for input(s): LIPASE, AMYLASE in the last 168 hours. No results for input(s): AMMONIA in the last 168 hours. Coagulation Profile: Recent Labs  Lab 06/08/21 1923  INR 1.0   Cardiac Enzymes: No results for input(s): CKTOTAL, CKMB, CKMBINDEX, TROPONINI in the last 168 hours. BNP (last 3 results) No results for input(s): PROBNP in the last 8760 hours. HbA1C: No results for input(s): HGBA1C in the last 72 hours. CBG: No results for input(s): GLUCAP in the last 168 hours. Lipid Profile: No results for input(s): CHOL, HDL, LDLCALC, TRIG, CHOLHDL, LDLDIRECT in the last 72 hours. Thyroid Function Tests: No results for input(s): TSH, T4TOTAL, FREET4, T3FREE, THYROIDAB in the last 72 hours. Anemia Panel: No results for input(s): VITAMINB12, FOLATE, FERRITIN, TIBC, IRON, RETICCTPCT in the last 72 hours.  Sepsis Labs: Recent Labs  Lab 06/08/21 1430 06/08/21 1714   LATICACIDVEN 0.8 1.0    Recent Results (from the past 240 hour(s))  Culture, blood (Routine X 2) w Reflex to ID Panel     Status: Abnormal   Collection Time: 06/06/21 10:30 AM   Specimen: BLOOD LEFT FOREARM  Result Value Ref Range Status   Specimen Description BLOOD LEFT FOREARM  Final   Special Requests   Final    BOTTLES DRAWN AEROBIC AND ANAEROBIC Blood Culture adequate volume   Culture  Setup Time   Final    GRAM POSITIVE COCCI IN CLUSTERS ANAEROBIC BOTTLE ONLY CRITICAL RESULT CALLED TO, READ BACK BY AND VERIFIED WITH: RN [facility] [phone] FCP Performed at [facility], [place] [address] [place], [place] [address]    Culture STAPHYLOCOCCUS AUREUS (A)  Final   Report Status 06/09/2021 FINAL  Final   Organism ID, Bacteria STAPHYLOCOCCUS AUREUS  Final      Susceptibility   Staphylococcus aureus - MIC*    CIPROFLOXACIN <=0.5 SENSITIVE Sensitive     ERYTHROMYCIN <=0.25 SENSITIVE Sensitive     GENTAMICIN <=0.5 SENSITIVE Sensitive     OXACILLIN 0.5 SENSITIVE Sensitive     TETRACYCLINE <=1 SENSITIVE Sensitive     VANCOMYCIN 1 SENSITIVE Sensitive     TRIMETH/SULFA <=10 SENSITIVE Sensitive     CLINDAMYCIN <=0.25 SENSITIVE Sensitive     RIFAMPIN <=0.5 SENSITIVE Sensitive     Inducible Clindamycin NEGATIVE Sensitive     * STAPHYLOCOCCUS AUREUS  Blood Culture ID Panel (Reflexed)     Status: Abnormal   Collection Time: 06/06/21 10:30 AM  Result Value Ref Range Status   Enterococcus faecalis NOT DETECTED NOT DETECTED Final   Enterococcus Faecium NOT DETECTED NOT DETECTED Final   Listeria monocytogenes NOT DETECTED NOT DETECTED Final   Staphylococcus species DETECTED (A) NOT DETECTED Final    Comment: CRITICAL RESULT CALLED TO, READ BACK BY AND VERIFIED WITH: RN SAM [phone] FCP    Staphylococcus aureus (BCID) DETECTED (A) NOT DETECTED Final    Comment: CRITICAL RESULT CALLED TO, READ BACK BY AND VERIFIED WITH: RN SAM [id number] FCP    Staphylococcus epidermidis NOT  DETECTED NOT DETECTED Final   Staphylococcus lugdunensis NOT DETECTED NOT DETECTED Final   Streptococcus species NOT DETECTED NOT DETECTED Final   Streptococcus agalactiae NOT DETECTED NOT DETECTED Final   Streptococcus pneumoniae NOT DETECTED NOT DETECTED Final   Streptococcus pyogenes NOT DETECTED NOT DETECTED  Final   A.calcoaceticus-baumannii NOT DETECTED NOT DETECTED Final   Bacteroides fragilis NOT DETECTED NOT DETECTED Final   Enterobacterales NOT DETECTED NOT DETECTED Final   Enterobacter cloacae complex NOT DETECTED NOT DETECTED Final   Escherichia coli NOT DETECTED NOT DETECTED Final   Klebsiella aerogenes NOT DETECTED NOT DETECTED Final   Klebsiella oxytoca NOT DETECTED NOT DETECTED Final   Klebsiella pneumoniae NOT DETECTED NOT DETECTED Final   Proteus species NOT DETECTED NOT DETECTED Final   Salmonella species NOT DETECTED NOT DETECTED Final   Serratia marcescens NOT DETECTED NOT DETECTED Final   Haemophilus influenzae NOT DETECTED NOT DETECTED Final   Neisseria meningitidis NOT DETECTED NOT DETECTED Final   Pseudomonas aeruginosa NOT DETECTED NOT DETECTED Final   Stenotrophomonas maltophilia NOT DETECTED NOT DETECTED Final   Candida albicans NOT DETECTED NOT DETECTED Final   Candida auris NOT DETECTED NOT DETECTED Final   Candida glabrata NOT DETECTED NOT DETECTED Final   Candida krusei NOT DETECTED NOT DETECTED Final   Candida parapsilosis NOT DETECTED NOT DETECTED Final   Candida tropicalis NOT DETECTED NOT DETECTED Final   Cryptococcus neoformans/gattii NOT DETECTED NOT DETECTED Final   Meth resistant mecA/C and MREJ NOT DETECTED NOT DETECTED Final    Comment: Performed at [facility], [address] [place], [place] [address]  Blood culture (routine single)     Status: None   Collection Time: 06/06/21 10:35 AM   Specimen: BLOOD  Result Value Ref Range Status   Specimen Description BLOOD RIGHT ANTECUBITAL  Final   Special Requests   Final    BOTTLES DRAWN  AEROBIC AND ANAEROBIC Blood Culture results may not be optimal due to an excessive volume of blood received in culture bottles   Culture   Final    NO GROWTH 5 DAYS Performed at [facility], [place] [address] [place], [place] [address]    Report Status 06/11/2021 FINAL  Final  Culture, blood (routine x 2)     Status: None   Collection Time: 06/08/21  4:29 PM   Specimen: Site Not Specified; Blood  Result Value Ref Range Status   Specimen Description SITE NOT SPECIFIED  Final   Special Requests   Final    BOTTLES DRAWN AEROBIC AND ANAEROBIC Blood Culture adequate volume   Culture   Final    NO GROWTH 5 DAYS Performed at [facility], [place] [address] [place], [place] [address]    Report Status 06/13/2021 FINAL  Final  Culture, blood (routine x 2)     Status: None   Collection Time: 06/08/21  5:00 PM   Specimen: BLOOD RIGHT FOREARM  Result Value Ref Range Status   Specimen Description BLOOD RIGHT FOREARM  Final   Special Requests   Final    BOTTLES DRAWN AEROBIC AND ANAEROBIC Blood Culture adequate volume   Culture   Final    NO GROWTH 5 DAYS Performed at [facility], [place] [address] [place], [place] [address]    Report Status 06/13/2021 FINAL  Final  Resp Panel by RT-PCR (Flu A&B, Covid) Nasopharyngeal Swab     Status: None   Collection Time: 06/08/21  5:35 PM   Specimen: Nasopharyngeal Swab; Nasopharyngeal(NP) swabs in vial transport medium  Result Value Ref Range Status   SARS Coronavirus 2 by RT PCR NEGATIVE NEGATIVE Final    Comment: (NOTE) SARS-CoV-2 target nucleic acids are NOT DETECTED.  The SARS-CoV-2 RNA is generally detectable in upper respiratory specimens during  the acute phase of infection. The lowest concentration of SARS-CoV-2 viral copies this assay can detect is 138 copies/mL. A negative result does not preclude SARS-Cov-2 infection and should not be used as the sole basis for treatment or other patient management decisions. A negative result  may occur with  improper specimen collection/handling, submission of specimen other than nasopharyngeal swab, presence of viral mutation(s) within the areas targeted by this assay, and inadequate number of viral copies(<138 copies/mL). A negative result must be combined with clinical observations, patient history, and epidemiological information. The expected result is Negative.  Fact Sheet for Patients:  EntrepreneurPulse.com.au  Fact Sheet for Healthcare Providers:  IncredibleEmployment.be  This test is no t yet approved or cleared by the [place] FDA and  has been authorized for detection and/or diagnosis of SARS-CoV-2 by FDA under an Emergency Use Authorization (EUA). This EUA will remain  in effect (meaning this test can be used) for the duration of the COVID-19 declaration under Section 564(b)(1) of the Act, 21 U.S.C.section 360bbb-3(b)(1), unless the authorization is terminated  or revoked sooner.       Influenza A by PCR NEGATIVE NEGATIVE Final   Influenza B by PCR NEGATIVE NEGATIVE Final    Comment: (NOTE) The Xpert Xpress SARS-CoV-2/FLU/RSV plus assay is intended as an aid in the diagnosis of influenza from Nasopharyngeal swab specimens and should not be used as a sole basis for treatment. Nasal washings and aspirates are unacceptable for Xpert Xpress SARS-CoV-2/FLU/RSV testing.  Fact Sheet for Patients: EntrepreneurPulse.com.au  Fact Sheet for Healthcare Providers: IncredibleEmployment.be  This test is not yet approved or cleared by the [place] FDA and has been authorized for detection and/or diagnosis of SARS-CoV-2 by FDA under an Emergency Use Authorization (EUA). This EUA will remain in effect (meaning this test can be used) for the duration of the COVID-19 declaration under Section 564(b)(1) of the Act, 21 U.S.C. section 360bbb-3(b)(1), unless the authorization is terminated  or revoked.  Performed at [facility], [place] [address], [place], [place] [address]          Radiology Studies: IR Fluoro Guide CV Line Right  Result Date: 06/13/2021 INDICATION: End-stage renal disease. Admitted with bacteremia post catheter holiday. Patient presents today for image guided placement of a new tunneled hemodialysis catheter. EXAM: TUNNELED CENTRAL VENOUS HEMODIALYSIS CATHETER PLACEMENT WITH ULTRASOUND AND FLUOROSCOPIC GUIDANCE MEDICATIONS: Ancef 2 gm IV. The antibiotic was given in an appropriate time interval prior to skin puncture. ANESTHESIA/SEDATION: Moderate (conscious) sedation was employed during this procedure. A total of Versed 2 mg and Fentanyl 100 mcg was administered intravenously. Moderate Sedation Time: 14 minutes. The patient's level of consciousness and vital signs were monitored continuously by radiology nursing throughout the procedure under my direct supervision. FLUOROSCOPY TIME:  36 seconds (3 mGy) COMPLICATIONS: None immediate. PROCEDURE: Informed written consent was obtained from the patient after a discussion of the risks, benefits, and alternatives to treatment. Questions regarding the procedure were encouraged and answered. The right neck and chest were prepped with chlorhexidine in a sterile fashion, and a sterile drape was applied covering the operative field. Maximum barrier sterile technique with sterile gowns and gloves were used for the procedure. A timeout was performed prior to the initiation of the procedure. After creating a small venotomy incision, a micropuncture kit was utilized to access the internal jugular vein. Real-time ultrasound guidance was utilized for vascular access including the acquisition of a permanent ultrasound image documenting patency of the accessed vessel. The microwire was  utilized to measure appropriate catheter length. A stiff Glidewire was advanced to the level of the IVC and the micropuncture sheath was exchanged for  a peel-away sheath. A palindrome tunneled hemodialysis catheter measuring 23 cm from tip to cuff was tunneled in a retrograde fashion from the anterior chest wall to the venotomy incision. The catheter was then placed through the peel-away sheath with tips ultimately positioned within the superior aspect of the right atrium. Final catheter positioning was confirmed and documented with a spot radiographic image. The catheter aspirates and flushes normally. The catheter was flushed with appropriate volume heparin dwells. The catheter exit site was secured with a 0-Prolene retention suture. The venotomy incision was closed with Dermabond and Steri-strips. Dressings were applied. The patient tolerated the procedure well without immediate post procedural complication. IMPRESSION: Successful placement of 23 cm tip to cuff tunneled hemodialysis catheter via the right internal jugular vein with tips terminating within the superior aspect of the right atrium. The catheter is ready for immediate use. Electronically Signed   By: Sandi Mariscal M.D.   On: 06/13/2021 09:28   IR Removal Tun Cv Cath W/O FL  Result Date: 06/11/2021 INDICATION: Patient with history of lupus nephritis and previously placed right internal jugular tunneled HD catheter on 11/30/2020; she now presents with staph bacteremia and request received for catheter removal EXAM: REMOVAL TUNNELED CENTRAL VENOUS CATHETER MEDICATIONS: % lidocaine to skin and subcutaneous tissue ANESTHESIA/SEDATION: None FLUOROSCOPY TIME:  None COMPLICATIONS: None immediate. PROCEDURE: Informed written consent was obtained from the patient after a thorough discussion of the procedural risks, benefits and alternatives. All questions were addressed. Maximal Sterile Barrier Technique was utilized including caps, mask, sterile gowns, sterile gloves, sterile drape, hand hygiene and skin antiseptic. A timeout was performed prior to the initiation of the procedure. The patient's right  chest and catheter was prepped and draped in a normal sterile fashion. Heparin was removed from both ports of catheter. 1% lidocaine was used for local anesthesia. Using gentle blunt dissection the cuff of the catheter was exposed and the catheter was removed in it's entirety. Pressure was held till hemostasis was obtained. A sterile dressing was applied. The patient tolerated the procedure well with no immediate complications. IMPRESSION: Successful catheter removal as described above. Read by: Rowe Robert, PA-C Electronically Signed   By: Corrie Mckusick D.O.   On: 06/11/2021 10:16   IR US Guide Vasc Access Right  Result Date: 06/13/2021 INDICATION: End-stage renal disease. Admitted with bacteremia post catheter holiday. Patient presents today for image guided placement of a new tunneled hemodialysis catheter. EXAM: TUNNELED CENTRAL VENOUS HEMODIALYSIS CATHETER PLACEMENT WITH ULTRASOUND AND FLUOROSCOPIC GUIDANCE MEDICATIONS: Ancef 2 gm IV. The antibiotic was given in an appropriate time interval prior to skin puncture. ANESTHESIA/SEDATION: Moderate (conscious) sedation was employed during this procedure. A total of Versed 2 mg and Fentanyl 100 mcg was administered intravenously. Moderate Sedation Time: 14 minutes. The patient's level of consciousness and vital signs were monitored continuously by radiology nursing throughout the procedure under my direct supervision. FLUOROSCOPY TIME:  36 seconds (3 mGy) COMPLICATIONS: None immediate. PROCEDURE: Informed written consent was obtained from the patient after a discussion of the risks, benefits, and alternatives to treatment. Questions regarding the procedure were encouraged and answered. The right neck and chest were prepped with chlorhexidine in a sterile fashion, and a sterile drape was applied covering the operative field. Maximum barrier sterile technique with sterile gowns and gloves were used for the procedure. A timeout was performed prior to  the initiation  of the procedure. After creating a small venotomy incision, a micropuncture kit was utilized to access the internal jugular vein. Real-time ultrasound guidance was utilized for vascular access including the acquisition of a permanent ultrasound image documenting patency of the accessed vessel. The microwire was utilized to measure appropriate catheter length. A stiff Glidewire was advanced to the level of the IVC and the micropuncture sheath was exchanged for a peel-away sheath. A palindrome tunneled hemodialysis catheter measuring 23 cm from tip to cuff was tunneled in a retrograde fashion from the anterior chest wall to the venotomy incision. The catheter was then placed through the peel-away sheath with tips ultimately positioned within the superior aspect of the right atrium. Final catheter positioning was confirmed and documented with a spot radiographic image. The catheter aspirates and flushes normally. The catheter was flushed with appropriate volume heparin dwells. The catheter exit site was secured with a 0-Prolene retention suture. The venotomy incision was closed with Dermabond and Steri-strips. Dressings were applied. The patient tolerated the procedure well without immediate post procedural complication. IMPRESSION: Successful placement of 23 cm tip to cuff tunneled hemodialysis catheter via the right internal jugular vein with tips terminating within the superior aspect of the right atrium. The catheter is ready for immediate use. Electronically Signed   By: Sandi Mariscal M.D.   On: 06/13/2021 09:28        Scheduled Meds:  (feeding supplement) PROSource Plus  30 mL Oral TID BM   calcium acetate  1,334 mg Oral TID WC   carvedilol  25 mg Oral BID WC   Chlorhexidine Gluconate Cloth  6 each Topical Q0600   Chlorhexidine Gluconate Cloth  6 each Topical Q0600   Chlorhexidine Gluconate Cloth  6 each Topical Q0600   clobetasol cream   Topical BID   cloNIDine  0.2 mg Oral TID   darbepoetin  (ARANESP) injection - DIALYSIS  150 mcg Intravenous Q Thu-HD   doxercalciferol  5 mcg Intravenous Q M,W,F-HD   fentaNYL       heparin  5,000 Units Subcutaneous Q8H   heparin sodium (porcine)       irbesartan  150 mg Oral Daily   isosorbide-hydrALAZINE  2 tablet Oral TID   lidocaine-EPINEPHrine       midazolam       pantoprazole  40 mg Oral Daily   predniSONE  40 mg Oral Q breakfast   torsemide  60 mg Oral BID   triamcinolone   Topical BID   Continuous Infusions:  sodium chloride     sodium chloride      ceFAZolin (ANCEF) IV 1 g (06/12/21 1640)    Assessment & Plan:   Principal Problem:   MSSA (methicillin susceptible Staphylococcus aureus) septicemia (HCC) Active Problems:   Asthma   MDD (major depressive disorder)   Lupus (systemic lupus erythematosus) (HCC)   Pleuritic chest pain   Diarrhea   ESRD (end stage renal disease) (HCC)   Benign essential HTN   HFrEF (heart failure with reduced ejection fraction) (HCC)   Pleural effusion   Anasarca associated with disorder of kidney   MSSA bacteremia, POA-patient not septic on admission, she is afebrile, no leukocytosis, not tachycardic nor tachypneic.  Initial blood cultures obtained on 7/7 in the ED visit finalized on 7/10 with MSSA sensitive to oxacillin.  She is currently on Ancef -Infectious disease following, recommending tunneled HD cath line holiday.  She received HD x2 on 7/11 early morning as well as late in the  evening and HD cath was removed today 7/12.  She will need HD cath replaced on 7/14 after 48-hour line holiday -2D echo done 7/9 showed EF 25-30% with severely decreased LV function/global hypokinesis, grade 2 diastolic dysfunction.  RV was normal.  She had a moderate pericardial effusion without evidence of cardiac tamponade.  Her tricuspid valve showed a small 0.44 echodensity on the ventricular side of the tricuspid valve.  7/14 cardiology following.  Plan for TEE today to rule out TV vegetation/evaluation   Continue cefazolin dosed for HD  Follow-up cultures      Active Problems ESRD on HD TTS-she is followed by Dr. Hollie Salk as an outpatient.  This is likely due to her lupus. She was hopeful for renal recovery and was somewhat resistant to having place an AV fistula but now scheduled with vascular surgery at the end of this month as an outpatient.  She is also interested in transplant follow-up with [facility] 7/14 nephrology following IR remove [facility] 7/12 Status post placement of tunneled HD catheter by IR today. Plan for dialysis to follow TEE     Lupus flare /systemic rash -she also has associated bilateral bilateral joint pains.  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  Continue with current management and monitor closely.   BP may decrease with dialysis today.        Asthma -complains of mild shortness of breath which I suspect is in the setting of fluid overload.  No wheezing.  Repeat chest x-ray today   DVT prophylaxis: Heparin Code Status: Full Family Communication: None at bedside Disposition Plan:  Status is: Inpatient  Remains inpatient appropriate because:Inpatient level of care appropriate due to severity of illness  Dispo: The patient is from: Home              Anticipated d/c is to: Home              Patient currently is not medically stable to d/c.   Difficult  to place patient No            LOS: 5 days   Time spent: 35 minutes with more than 50% on [place], MD [facility] Pager [phone]  If 7PM-7AM, please contact night-coverage 06/13/2021, 12:32 PM

## 2021-06-14 ENCOUNTER — Encounter: Payer: Self-pay | Admitting: Hematology and Oncology

## 2021-06-14 ENCOUNTER — Other Ambulatory Visit (HOSPITAL_COMMUNITY): Payer: Self-pay

## 2021-06-14 DIAGNOSIS — T829XXA Unspecified complication of cardiac and vascular prosthetic device, implant and graft, initial encounter: Secondary | ICD-10-CM

## 2021-06-14 DIAGNOSIS — A4901 Methicillin susceptible Staphylococcus aureus infection, unspecified site: Secondary | ICD-10-CM

## 2021-06-14 MED ORDER — CARVEDILOL 25 MG PO TABS
25.0000 mg | ORAL_TABLET | Freq: Two times a day (BID) | ORAL | 3 refills | Status: DC
  Filled 2021-06-14: qty 60, 30d supply, fill #0

## 2021-06-14 MED ORDER — IRBESARTAN 300 MG PO TABS
300.0000 mg | ORAL_TABLET | Freq: Every day | ORAL | 3 refills | Status: DC
  Filled 2021-06-14: qty 30, 30d supply, fill #0

## 2021-06-14 MED ORDER — ALBUTEROL SULFATE HFA 108 (90 BASE) MCG/ACT IN AERS: 2.0000 | INHALATION_SPRAY | Freq: Two times a day (BID) | RESPIRATORY_TRACT | Status: DC

## 2021-06-14 MED ORDER — CEFAZOLIN SODIUM-DEXTROSE 2-4 GM/100ML-% IV SOLN: 2.0000 g | INTRAVENOUS | Status: DC

## 2021-06-14 MED ORDER — CEFAZOLIN SODIUM-DEXTROSE 2-4 GM/100ML-% IV SOLN: 2.0000 g | INTRAVENOUS | Status: AC

## 2021-06-14 MED ORDER — PROSOURCE PLUS PO LIQD
30.0000 mL | Freq: Three times a day (TID) | ORAL | 2 refills | Status: DC
  Filled 2021-06-14 – 2022-01-27 (×2): qty 887, 10d supply, fill #0

## 2021-06-14 MED ORDER — ISOSORB DINITRATE-HYDRALAZINE 20-37.5 MG PO TABS
2.0000 | ORAL_TABLET | Freq: Three times a day (TID) | ORAL | 3 refills | Status: DC
  Filled 2021-06-14: qty 180, 30d supply, fill #0

## 2021-06-14 MED ORDER — IPRATROPIUM BROMIDE HFA 17 MCG/ACT IN AERS: 2.0000 | INHALATION_SPRAY | Freq: Four times a day (QID) | RESPIRATORY_TRACT | Status: DC | PRN

## 2021-06-14 MED ORDER — IRBESARTAN 300 MG PO TABS: 300.0000 mg | ORAL_TABLET | Freq: Every day | ORAL | Status: DC

## 2021-06-14 NOTE — Discharge Summary (Signed)
Physician Discharge Summary  Cathy [name] SAY:[id number] DOB: 1981-05-30 DOA: 06/08/2021  PCP: Pcp, No  Admit date: 06/08/2021 Discharge date: 06/14/2021  Admitted From: Home  Disposition:  Home   Recommendations for Outpatient Follow-up:  Follow up with Dr. Hollie Salk in 1 week Dialysis: Please administer Cefazolin 2g after dialysis T-Th-S until Aug. 9, 2022 Dr. Hollie Salk: Please obtain CBC/D once weekly while on Ancef  Dr. Hollie Salk: Please assist patient with referral to Rheumatology  Follow up with Infectious Disease in 4 weeks Follow up with Cardiology in 4 weeks       Home Health: None  Equipment/Devices: None new  Discharge Condition: Fair  CODE STATUS: FULL Diet recommendation: Renal  Brief/Interim Summary: Cathy [name] is a 40 year old female with history of lupus, ESRD on HD TTS since January 2022, asthma, HTN, who presented after being called at home that her blood cultures were positive.    On 7/7 she went to dialysis, had a fever of 102, new onset rash to her face and legs and was sent to the ED.  She was diagnosed with a lupus flare, was placed on steroids as well as doxycycline and sent home.  Blood cultures were obtained during that ED visit, and returned positive for MSSA.  Patient was called and was admitted to the hospital on 7/9 started on antibiotics.         PRINCIPAL HOSPITAL DIAGNOSIS: MSSA bacteremia    Discharge Diagnoses:   MSSA bacteremia Sepsis ruled out.  Initial blood cultures obtained on 7/7 in the ED visit finalized on 7/10 with MSSA sensitive to oxacillin.  ID consulted.  TEE negative for vegetation.  No satellite foci of infection.  Central line removed 7/12, repeat cultures negative.  New dialysis catheter placed, and plans for 4 weeks of cefazolin with dialysis until Aug. 9  Follow up with ID in place.        ESRD on HD TThS IR removed [facility] 7/12 and replaced new on 7/14   Lupus flare /systemic rash  Started on prednisone taper.  Needs  Rheumatology follow up.    New onset combined systolic and diastolic CHF Pericardial effusion Cardiology were consulted.  They have initiated GDMT with carvedilol, BiDil, and ARB.  They have cleared patient for discharge, with outpatient follow up for repeate imaging    Essential hypertension     Asthma  No active disease             Discharge Instructions  Discharge Instructions     Diet - low sodium heart healthy   Complete by: As directed    Discharge instructions   Complete by: As directed    From Dr. Loleta Books: You were admitted with a staph infection in the blood. You were treated with antibiotics, and your central line (the deep IV) was taken out.  Thankfully, you had no signs of infection on the heart valve.  Take antibiotics with dialysis for 4 weeks, finishing on Aug 9 Follow up with the infectious disease doctors, Dr. Juleen China, see info below (call his office for an appointment)   Your heart ultrasound (the TEE, or echo) did show by happenstance, that your heart squeeze is reduced. This is not normal The heart specialists will see you in the office They have changed several of your medicines: Take irbesartan once a day Take carvedilol twice a day Take BiDil three times a day and follow up with the Cardiologists, Dr. Percival Spanish, as detailed below   Lastly, for the Lupus, go see  your rheumatologist as soon as you can get in  Take prednisone according to your previous taper You are now on the 40 mg dose so: Take 4 tablets (40 mg total) daily with breakfast for 3 more days, THEN  Take 2 tablets (20 mg total) daily with breakfast for 4 days, THEN  Take 1 tablet (10 mg total) daily with breakfast for 4 days.   Increase activity slowly   Complete by: As directed    No wound care   Complete by: As directed       Allergies as of 06/14/2021   No Known Allergies      Medication List     STOP taking these medications    amLODipine 10 MG tablet Commonly  known as: NORVASC   B-12 1000 MCG Tabs   cloNIDine 0.3 MG tablet Commonly known as: CATAPRES   cyanocobalamin 100 MCG tablet   doxycycline 100 MG capsule Commonly known as: VIBRAMYCIN   folic acid 1 MG tablet Commonly known as: FOLVITE   labetalol 200 MG tablet Commonly known as: NORMODYNE   Mucus Relief 600 MG 12 hr tablet Generic drug: guaiFENesin   olmesartan 40 MG tablet Commonly known as: BENICAR   Vitamin D3 25 MCG tablet Commonly known as: Vitamin D       TAKE these medications    (feeding supplement) PROSource Plus liquid Take 30 mLs by mouth 3 (three) times daily between meals.   acetaminophen 500 MG tablet Commonly known as: TYLENOL Take 1,000 mg by mouth every 6 (six) hours as needed for moderate pain.   albuterol 108 (90 Base) MCG/ACT inhaler Commonly known as: VENTOLIN HFA Inhale 2 puffs into the lungs 2 (two) times daily.   BiDil 20-37.5 MG tablet Generic drug: isosorbide-hydrALAZINE Take 2 tablets by mouth 3 (three) times daily.   carvedilol 25 MG tablet Commonly known as: COREG Take 1 tablet (25 mg total) by mouth 2 (two) times daily with a meal.   ceFAZolin 2-4 GM/100ML-% IVPB Commonly known as: ANCEF Inject 100 mLs (2 g total) into the vein every Tuesday, Thursday, and Saturday at 6 PM for 24 days. Start taking on: June 15, 2021   ipratropium 17 MCG/ACT inhaler Commonly known as: ATROVENT HFA Inhale 2 puffs into the lungs every 6 (six) hours as needed for wheezing.   irbesartan 300 MG tablet Commonly known as: AVAPRO Take 1 tablet (300 mg total) by mouth daily. Start taking on: June 15, 2021   pantoprazole 40 MG tablet Commonly known as: PROTONIX TAKE 1 TABLET (40 MG TOTAL) BY MOUTH DAILY. What changed: how much to take   predniSONE 10 MG tablet Commonly known as: DELTASONE Take 6 tablets (60 mg total) by mouth daily with breakfast for 4 days, THEN 4 tablets (40 mg total) daily with breakfast for 4 days, THEN 2 tablets (20 mg  total) daily with breakfast for 4 days, THEN 1 tablet (10 mg total) daily with breakfast for 4 days. Start taking on: June 07, 2021 What changed: Another medication with the same name was removed. Continue taking this medication, and follow the directions you see here.   torsemide 20 MG tablet Commonly known as: DEMADEX TAKE 3 TABLETS (60 MG TOTAL) BY MOUTH 2 (TWO) TIMES DAILY. What changed:  how much to take how to take this when to take this        Follow-up Information     Minus Breeding, MD. Schedule an appointment as soon as possible for a visit in  2 week(s).   Specialty: Cardiology Contact information: [address] [place] [address] [phone]         Mignon Pine, DO. Schedule an appointment as soon as possible for a visit in 2 week(s).   Specialties: Infectious Diseases, Internal Medicine Contact information: [address] [place] [address] [phone]                No Known Allergies  Consultations: Cardiology Nephrology Interventional radiology ID   Procedures/Studies: IR Fluoro Guide CV Line Right  Result Date: 06/13/2021 INDICATION: End-stage renal disease. Admitted with bacteremia post catheter holiday. Patient presents today for image guided placement of a new tunneled hemodialysis catheter. EXAM: TUNNELED CENTRAL VENOUS HEMODIALYSIS CATHETER PLACEMENT WITH ULTRASOUND AND FLUOROSCOPIC GUIDANCE MEDICATIONS: Ancef 2 gm IV. The antibiotic was given in an appropriate time interval prior to skin puncture. ANESTHESIA/SEDATION: Moderate (conscious) sedation was employed during this procedure. A total of Versed 2 mg and Fentanyl 100 mcg was administered intravenously. Moderate Sedation Time: 14 minutes. The patient's level of consciousness and vital signs were monitored continuously by radiology nursing throughout the procedure under my direct supervision. FLUOROSCOPY TIME:  36 seconds (3 mGy) COMPLICATIONS:  None immediate. PROCEDURE: Informed written consent was obtained from the patient after a discussion of the risks, benefits, and alternatives to treatment. Questions regarding the procedure were encouraged and answered. The right neck and chest were prepped with chlorhexidine in a sterile fashion, and a sterile drape was applied covering the operative field. Maximum barrier sterile technique with sterile gowns and gloves were used for the procedure. A timeout was performed prior to the initiation of the procedure. After creating a small venotomy incision, a micropuncture kit was utilized to access the internal jugular vein. Real-time ultrasound guidance was utilized for vascular access including the acquisition of a permanent ultrasound image documenting patency of the accessed vessel. The microwire was utilized to measure appropriate catheter length. A stiff Glidewire was advanced to the level of the IVC and the micropuncture sheath was exchanged for a peel-away sheath. A palindrome tunneled hemodialysis catheter measuring 23 cm from tip to cuff was tunneled in a retrograde fashion from the anterior chest wall to the venotomy incision. The catheter was then placed through the peel-away sheath with tips ultimately positioned within the superior aspect of the right atrium. Final catheter positioning was confirmed and documented with a spot radiographic image. The catheter aspirates and flushes normally. The catheter was flushed with appropriate volume heparin dwells. The catheter exit site was secured with a 0-Prolene retention suture. The venotomy incision was closed with Dermabond and Steri-strips. Dressings were applied. The patient tolerated the procedure well without immediate post procedural complication. IMPRESSION: Successful placement of 23 cm tip to cuff tunneled hemodialysis catheter via the right internal jugular vein with tips terminating within the superior aspect of the right atrium. The catheter is  ready for immediate use. Electronically Signed   By: Sandi Mariscal M.D.   On: 06/13/2021 09:28   IR Removal Tun Cv Cath W/O FL  Result Date: 06/11/2021 INDICATION: Patient with history of lupus nephritis and previously placed right internal jugular tunneled HD catheter on 11/30/2020; she now presents with staph bacteremia and request received for catheter removal EXAM: REMOVAL TUNNELED CENTRAL VENOUS CATHETER MEDICATIONS: % lidocaine to skin and subcutaneous tissue ANESTHESIA/SEDATION: None FLUOROSCOPY TIME:  None COMPLICATIONS: None immediate. PROCEDURE: Informed written consent was obtained from the patient after a thorough discussion of the procedural risks, benefits  and alternatives. All questions were addressed. Maximal Sterile Barrier Technique was utilized including caps, mask, sterile gowns, sterile gloves, sterile drape, hand hygiene and skin antiseptic. A timeout was performed prior to the initiation of the procedure. The patient's right chest and catheter was prepped and draped in a normal sterile fashion. Heparin was removed from both ports of catheter. 1% lidocaine was used for local anesthesia. Using gentle blunt dissection the cuff of the catheter was exposed and the catheter was removed in it's entirety. Pressure was held till hemostasis was obtained. A sterile dressing was applied. The patient tolerated the procedure well with no immediate complications. IMPRESSION: Successful catheter removal as described above. Read by: Rowe Robert, PA-C Electronically Signed   By: Corrie Mckusick D.O.   On: 06/11/2021 10:16   IR US Guide Vasc Access Right  Result Date: 06/13/2021 INDICATION: End-stage renal disease. Admitted with bacteremia post catheter holiday. Patient presents today for image guided placement of a new tunneled hemodialysis catheter. EXAM: TUNNELED CENTRAL VENOUS HEMODIALYSIS CATHETER PLACEMENT WITH ULTRASOUND AND FLUOROSCOPIC GUIDANCE MEDICATIONS: Ancef 2 gm IV. The antibiotic was given  in an appropriate time interval prior to skin puncture. ANESTHESIA/SEDATION: Moderate (conscious) sedation was employed during this procedure. A total of Versed 2 mg and Fentanyl 100 mcg was administered intravenously. Moderate Sedation Time: 14 minutes. The patient's level of consciousness and vital signs were monitored continuously by radiology nursing throughout the procedure under my direct supervision. FLUOROSCOPY TIME:  36 seconds (3 mGy) COMPLICATIONS: None immediate. PROCEDURE: Informed written consent was obtained from the patient after a discussion of the risks, benefits, and alternatives to treatment. Questions regarding the procedure were encouraged and answered. The right neck and chest were prepped with chlorhexidine in a sterile fashion, and a sterile drape was applied covering the operative field. Maximum barrier sterile technique with sterile gowns and gloves were used for the procedure. A timeout was performed prior to the initiation of the procedure. After creating a small venotomy incision, a micropuncture kit was utilized to access the internal jugular vein. Real-time ultrasound guidance was utilized for vascular access including the acquisition of a permanent ultrasound image documenting patency of the accessed vessel. The microwire was utilized to measure appropriate catheter length. A stiff Glidewire was advanced to the level of the IVC and the micropuncture sheath was exchanged for a peel-away sheath. A palindrome tunneled hemodialysis catheter measuring 23 cm from tip to cuff was tunneled in a retrograde fashion from the anterior chest wall to the venotomy incision. The catheter was then placed through the peel-away sheath with tips ultimately positioned within the superior aspect of the right atrium. Final catheter positioning was confirmed and documented with a spot radiographic image. The catheter aspirates and flushes normally. The catheter was flushed with appropriate volume heparin  dwells. The catheter exit site was secured with a 0-Prolene retention suture. The venotomy incision was closed with Dermabond and Steri-strips. Dressings were applied. The patient tolerated the procedure well without immediate post procedural complication. IMPRESSION: Successful placement of 23 cm tip to cuff tunneled hemodialysis catheter via the right internal jugular vein with tips terminating within the superior aspect of the right atrium. The catheter is ready for immediate use. Electronically Signed   By: Sandi Mariscal M.D.   On: 06/13/2021 09:28   DG CHEST PORT 1 VIEW  Result Date: 06/11/2021 CLINICAL DATA:  Dyspnea.  Chest pain. EXAM: PORTABLE CHEST 1 VIEW COMPARISON:  06/06/2021 FINDINGS: Normal heart size scratch set stable cardiomediastinal contours. Persistent bilateral  pleural effusions, left greater than right. Mild interstitial edema. No airspace consolidation. IMPRESSION: Persistent bilateral pleural effusions, left greater than right. Mild interstitial edema. Electronically Signed   By: Kerby Moors M.D.   On: 06/11/2021 13:39   DG Chest Port 1 View  Result Date: 06/06/2021 CLINICAL DATA:  Questionable sepsis EXAM: PORTABLE CHEST 1 VIEW COMPARISON:  01/18/2021 FINDINGS: Chronic cardiopericardial enlargement. Hazy opacity at the bases which includes small volume pleural fluid. Dialysis catheter with tip at the right atrium. No pneumothorax. IMPRESSION: Small pleural effusions with right more than left pulmonary opacity at the bases. Atelectasis or pneumonia could be present at the right base. Electronically Signed   By: Monte Fantasia M.D.   On: 06/06/2021 10:07   VAS [place] UPPER EXTREMITY ARTERIAL DUPLEX  Result Date: 06/05/2021  UPPER EXTREMITY DUPLEX STUDY Patient Name:  Cathy [name]  Date of Exam:   06/05/2021 Medical Rec #: [id number]         Accession #:    [id number] Date of Birth: 1981/03/16        Patient Gender: F Patient Age:   54Y Exam Location:  [facility]  Procedure:      VAS [place] UPPER EXTREMITY ARTERIAL DUPLEX Referring Phys: [id number] Waynetta Sandy --------------------------------------------------------------------------------  Indications: New access needed. History:     Patient has a history of ESRD.  Performing Technologist: Ronal Fear RVS, RCS  Examination Guidelines: A complete evaluation includes B-mode imaging, spectral Doppler, color Doppler, and power Doppler as needed of all accessible portions of each vessel. Bilateral testing is considered an integral part of a complete examination. Limited examinations for reoccurring indications may be performed as noted.  Right Pre-Dialysis Findings: +-----------------------+----------+--------------------+---------+--------+ Location               PSV (cm/s)Intralum. Diam. (cm)Waveform Comments +-----------------------+----------+--------------------+---------+--------+ Brachial Antecub. fossa59        0.37                triphasic         +-----------------------+----------+--------------------+---------+--------+ Radial Art at Wrist    24        0.18                biphasic          +-----------------------+----------+--------------------+---------+--------+ Ulnar Art at Wrist     29        0.16                biphasic          +-----------------------+----------+--------------------+---------+--------+  Left Pre-Dialysis Findings: +-----------------------+----------+--------------------+---------+--------+ Location               PSV (cm/s)Intralum. Diam. (cm)Waveform Comments +-----------------------+----------+--------------------+---------+--------+ Brachial Antecub. fossa58        0.39                triphasic         +-----------------------+----------+--------------------+---------+--------+ Radial Art at Wrist    65        0.18                triphasic         +-----------------------+----------+--------------------+---------+--------+ Ulnar Art at  Wrist     29        0.15                triphasic         +-----------------------+----------+--------------------+---------+--------+  Summary:  Right: No obstruction visualized in the right upper extremity. Left: No obstruction visualized in the left  upper extremity. *See table(s) above for measurements and observations. Electronically signed by Deitra Mayo MD on 06/05/2021 at 1:18:38 PM.    Final    ECHOCARDIOGRAM COMPLETE  Result Date: 06/08/2021    ECHOCARDIOGRAM REPORT   Patient Name:   Cathy [name] Date of Exam: 06/08/2021 Medical Rec #:  [id number]        Height:       67.0 in Accession #:    [id number]       Weight:       147.0 lb Date of Birth:  10/11/1981       BSA:          1.774 m Patient Age:    62 years         BP:           155/119 mmHg Patient Gender: F                HR:           70 bpm. Exam Location:  Inpatient Procedure: 2D Echo, Cardiac Doppler and Color Doppler Indications:     Endocarditis  History:         Patient has prior history of Echocardiogram examinations, most                  recent 11/15/2020. Risk Factors:Former Smoker. Substance abuse.                  Anasarca. Lupus. Moderate pericardial effusion.  Sonographer:     Merrie Roof RDCS Referring Phys:  [id number] Janeece Fitting Diagnosing Phys: Rudean Haskell MD IMPRESSIONS  1. Left ventricular ejection fraction, by estimation, is 25 to 30%. The left ventricle has severely decreased function. The left ventricle demonstrates global hypokinesis. There is mild concentric left ventricular hypertrophy. Left ventricular diastolic  parameters are consistent with Grade II diastolic dysfunction (pseudonormalization).  2. Right ventricular systolic function is normal. The right ventricular size is normal. Mildly increased right ventricular wall thickness. There is normal pulmonary artery systolic pressure.  3. Left atrial size was severely dilated.  4. Right atrial size was severely dilated.  5. Moderate pericardial  effusion. There is no evidence of cardiac tamponade.  6. The mitral valve is grossly normal. Mild to moderate mitral valve regurgitation.  7. The tricuspid valve is abnormal- small 0.44 echodensity seen on the ventricular side of the tricuspid valve (Image 19/85. Tricuspid valve regurgitation is mild to moderate.  8. The aortic valve is tricuspid. Aortic valve regurgitation is mild. No aortic stenosis is present. Comparison(s): A prior study was performed on 11/15/2020. Prior images reviewed side by side. LVEF is significantly worse. Small echo density on tricuspid valve was only seen in PSAX axis (not well visualized in prior study). Conclusion(s)/Recommendation(s): TEE could be considered given the clinical question of endocarditis. Addendum: Primary team updated. FINDINGS  Left Ventricle: Left ventricular ejection fraction, by estimation, is 25 to 30%. The left ventricle has severely decreased function. The left ventricle demonstrates global hypokinesis. The left ventricular internal cavity size was normal in size. There is mild concentric left ventricular hypertrophy. Left ventricular diastolic parameters are consistent with Grade II diastolic dysfunction (pseudonormalization). Right Ventricle: The right ventricular size is normal. Mildly increased right ventricular wall thickness. Right ventricular systolic function is normal. There is normal pulmonary artery systolic pressure. The tricuspid regurgitant velocity is 2.71 m/s, and with an assumed right atrial pressure of 3 mmHg, the estimated right ventricular systolic pressure is 83.1 mmHg. Left Atrium:  Left atrial size was severely dilated. Right Atrium: Right atrial size was severely dilated. Pericardium: A moderately sized pericardial effusion is present. There is no evidence of cardiac tamponade. Mitral Valve: The mitral valve is grossly normal. Mild to moderate mitral valve regurgitation. Tricuspid Valve: Small 0.44 echodensity seen on the ventricular side  of the tricuspid valve (Image 19/85). The tricuspid valve is abnormal. Tricuspid valve regurgitation is mild to moderate. No evidence of tricuspid stenosis. Aortic Valve: The aortic valve is tricuspid. Aortic valve regurgitation is mild. No aortic stenosis is present. Aortic valve mean gradient measures 5.0 mmHg. Aortic valve peak gradient measures 8.3 mmHg. Aortic valve area, by VTI measures 2.08 cm. Pulmonic Valve: The pulmonic valve was normal in structure. Pulmonic valve regurgitation is mild. No evidence of pulmonic stenosis. Aorta: The aortic root and ascending aorta are structurally normal, with no evidence of dilitation. IAS/Shunts: The atrial septum is grossly normal.  LEFT VENTRICLE PLAX 2D LVIDd:         5.40 cm  Diastology LVIDs:         4.70 cm  LV e' medial:    6.64 cm/s LV PW:         1.20 cm  LV E/e' medial:  17.2 LV IVS:        1.10 cm  LV e' lateral:   7.51 cm/s LVOT diam:     1.90 cm  LV E/e' lateral: 15.2 LV SV:         62 LV SV Index:   35 LVOT Area:     2.84 cm  RIGHT VENTRICLE             IVC RV Basal diam:  5.30 cm     IVC diam: 2.00 cm RV Mid diam:    3.40 cm RV S prime:     15.10 cm/s TAPSE (M-mode): 2.8 cm LEFT ATRIUM            Index       RIGHT ATRIUM           Index LA diam:      4.60 cm  2.59 cm/m  RA Area:     28.60 cm LA Vol (A2C): 97.2 ml  54.78 ml/m RA Volume:   111.00 ml 62.55 ml/m LA Vol (A4C): 116.0 ml 65.37 ml/m  AORTIC VALVE AV Area (Vmax):    2.42 cm AV Area (Vmean):   2.07 cm AV Area (VTI):     2.08 cm AV Vmax:           144.00 cm/s AV Vmean:          99.200 cm/s AV VTI:            0.299 m AV Peak Grad:      8.3 mmHg AV Mean Grad:      5.0 mmHg LVOT Vmax:         123.00 cm/s LVOT Vmean:        72.300 cm/s LVOT VTI:          0.219 m LVOT/AV VTI ratio: 0.73  AORTA Ao Root diam: 3.30 cm Ao Asc diam:  3.40 cm MITRAL VALVE                TRICUSPID VALVE MV Area (PHT): 4.80 cm     TR Peak grad:   29.4 mmHg MV Decel Time: 158 msec     TR Vmax:        271.00 cm/s MV E  velocity: 114.00  cm/s MV A velocity: 69.20 cm/s   SHUNTS MV E/A ratio:  1.65         Systemic VTI:  0.22 m                             Systemic Diam: 1.90 cm Rudean Haskell MD Electronically signed by Rudean Haskell MD Signature Date/Time: 06/08/2021/5:41:40 PM    Final (Updated)    ECHO TEE  Result Date: 06/13/2021    TRANSESOPHOGEAL ECHO REPORT   Patient Name:   Cathy [name] Date of Exam: 06/13/2021 Medical Rec #:  [id number]        Height:       67.0 in Accession #:    [id number]       Weight:       149.9 lb Date of Birth:  07/05/1981       BSA:          1.789 m Patient Age:    39 years         BP:           110/72 mmHg Patient Gender: F                HR:           56 bpm. Exam Location:  Inpatient Procedure: Transesophageal Echo and 3D Echo Indications:    TV vegetation  History:        Patient has prior history of Echocardiogram examinations.                 Signs/Symptoms:Bacteremia.  Sonographer:    Raquel Sarna Senior Referring Phys: [id number] Leanor Kail PROCEDURE: The transesophogeal probe was passed without difficulty through the esophogus of the patient. Sedation performed by different physician. The patient developed no complications during the procedure. IMPRESSIONS  1. No evidence of SBE/Vegetations.  2. Left ventricular ejection fraction, by estimation, is 25 to 30%. The left ventricle has severely decreased function. The left ventricle demonstrates global hypokinesis. The left ventricular internal cavity size was moderately dilated. Left ventricular diastolic function could not be evaluated.  3. Right ventricular systolic function is moderately reduced. The right ventricular size is moderately enlarged.  4. Left atrial size was moderately dilated. No left atrial/left atrial appendage thrombus was detected.  5. Right atrial size was New dialysis catheter tip noted on RA.  6. Moderate effuson no evidence tamponade . Moderate pericardial effusion.  7. The mitral valve is normal in  structure. Mild mitral valve regurgitation.  8. I reviewed patients previous TTE and did not see evidence of a vegetation on that study either . Tricuspid valve regurgitation is moderate.  9. The aortic valve is tricuspid. Aortic valve regurgitation is mild. Mild aortic valve sclerosis is present, with no evidence of aortic valve stenosis. FINDINGS  Left Ventricle: Left ventricular ejection fraction, by estimation, is 25 to 30%. The left ventricle has severely decreased function. The left ventricle demonstrates global hypokinesis. The left ventricular internal cavity size was moderately dilated. There is no left ventricular hypertrophy. Left ventricular diastolic function could not be evaluated. Right Ventricle: The right ventricular size is moderately enlarged. No increase in right ventricular wall thickness. Right ventricular systolic function is moderately reduced. Left Atrium: Left atrial size was moderately dilated. No left atrial/left atrial appendage thrombus was detected. Right Atrium: Right atrial size was New dialysis catheter tip noted on RA. Pericardium: Moderate effuson no evidence tamponade. A moderately sized pericardial effusion is  present. Mitral Valve: The mitral valve is normal in structure. Mild mitral valve regurgitation. Tricuspid Valve: I reviewed patients previous TTE and did not see evidence of a vegetation on that study either. The tricuspid valve is normal in structure. Tricuspid valve regurgitation is moderate. There is no evidence of tricuspid valve vegetation. Aortic Valve: The aortic valve is tricuspid. Aortic valve regurgitation is mild. Mild aortic valve sclerosis is present, with no evidence of aortic valve stenosis. Aortic valve mean gradient measures 5.0 mmHg. Aortic valve peak gradient measures 7.3 mmHg. Pulmonic Valve: The pulmonic valve was normal in structure. Pulmonic valve regurgitation is mild to moderate. Aorta: The aortic root is normal in size and structure. IAS/Shunts:  No atrial level shunt detected by color flow Doppler. Additional Comments: No evidence of SBE/Vegetations.  AORTIC VALVE AV Vmax:      135.00 cm/s AV Vmean:     114.000 cm/s AV VTI:       0.256 m AV Peak Grad: 7.3 mmHg AV Mean Grad: 5.0 mmHg Jenkins Rouge MD Electronically signed by Jenkins Rouge MD Signature Date/Time: 06/13/2021/2:18:19 PM    Final    VAS [place] UPPER EXTREMITY VEIN MAPPING  Result Date: 06/05/2021 UPPER EXTREMITY VEIN MAPPING Patient Name:  Cathy [name]  Date of Exam:   06/05/2021 Medical Rec #: [id number]         Accession #:    [id number] Date of Birth: 1981/02/01        Patient Gender: F Patient Age:   3Y Exam Location:  [facility] Procedure:      VAS [place] UPPER EXT VEIN MAPPING (PRE-OP AVF) Referring Phys: [id number] [place] --------------------------------------------------------------------------------  Indications: Pre-access. History: ESRD.  Performing Technologist: Ronal Fear RVS, RCS  Examination Guidelines: A complete evaluation includes B-mode imaging, spectral Doppler, color Doppler, and power Doppler as needed of all accessible portions of each vessel. Bilateral testing is considered an integral part of a complete examination. Limited examinations for reoccurring indications may be performed as noted. +-----------------+-------------+----------+--------+ Right Cephalic   Diameter (cm)Depth (cm)Findings +-----------------+-------------+----------+--------+ Shoulder             0.25                        +-----------------+-------------+----------+--------+ Prox upper arm       0.21                        +-----------------+-------------+----------+--------+ Mid upper arm        0.19                        +-----------------+-------------+----------+--------+ Dist upper arm       0.18                        +-----------------+-------------+----------+--------+ Antecubital fossa    0.22                         +-----------------+-------------+----------+--------+ Prox forearm                            thrombus +-----------------+-------------+----------+--------+ Mid forearm                             thrombus +-----------------+-------------+----------+--------+ Dist forearm  thrombus +-----------------+-------------+----------+--------+ +-----------------+-------------+----------+--------+ Right Basilic    Diameter (cm)Depth (cm)Findings +-----------------+-------------+----------+--------+ Mid upper arm        0.16                        +-----------------+-------------+----------+--------+ Dist upper arm       0.18                        +-----------------+-------------+----------+--------+ Antecubital fossa    0.17                        +-----------------+-------------+----------+--------+ +-----------------+-------------+----------+--------+ Left Cephalic    Diameter (cm)Depth (cm)Findings +-----------------+-------------+----------+--------+ Shoulder             0.18                        +-----------------+-------------+----------+--------+ Prox upper arm       0.16                        +-----------------+-------------+----------+--------+ Mid upper arm        0.21                        +-----------------+-------------+----------+--------+ Dist upper arm       0.19                        +-----------------+-------------+----------+--------+ Antecubital fossa    0.28                        +-----------------+-------------+----------+--------+ Prox forearm         0.16                        +-----------------+-------------+----------+--------+ Mid forearm          0.11                        +-----------------+-------------+----------+--------+ +-----------------+-------------+----------+--------+ Left Basilic     Diameter (cm)Depth (cm)Findings +-----------------+-------------+----------+--------+ Mid  upper arm        0.18                        +-----------------+-------------+----------+--------+ Dist upper arm       0.16                        +-----------------+-------------+----------+--------+ Antecubital fossa    0.15                        +-----------------+-------------+----------+--------+ Summary: Right: Patent and compressible basilic vein.        The cephalic vein is thrombosed in the forearm. Left: Patent and compressible cephalic and basilic veins. *See table(s) above for measurements and observations.  Diagnosing physician: Deitra Mayo MD Electronically signed by Deitra Mayo MD on 06/05/2021 at 1:54:47 PM.    Final       Subjective: No fever. Small headache, no chest pain, dysepnea, no new rash, no joint pains, no focal new pains.  Lips a little tingly, cracked.  Discharge Exam: Vitals:   06/14/21 0054 06/14/21 0900  BP: (!) 141/107 (!) 172/124  Pulse: 68 77  Resp: 20 18  Temp: 97.9 F (36.6 C) 98.2 F (36.8 C)  SpO2: 100%  99%   Vitals:   06/13/21 2323 06/14/21 0020 06/14/21 0054 06/14/21 0900  BP: (!) 130/100 (!) 145/105 (!) 141/107 (!) 172/124  Pulse: 73  68 77  Resp: 20  20 18   Temp:  98.6 F (37 C) 97.9 F (36.6 C) 98.2 F (36.8 C)  TempSrc:  Oral Oral Oral  SpO2: 100%  100% 99%  Weight:        General appearance: Thin adult female, alert and in no acute distress.  Sleeping, rouses easily, interactive. HEENT: Anicteric, conjunctiva pink, lids and lashes normal. No nasal deformity, discharge, epistaxis.  Lips moisturized but cracked, dentition good, OP moist, no oral lesions, hearing normal.   Skin: Warm and dry.  no jaundice.  Old diffuse whole body evolved papular rash, mostly now evolved to hypopigmented spots. Cardiac: RRR, nl S1-S2, no murmurs appreciated.  Capillary refill is brisk.  No LE edema.  Radial pulses 2+ and symmetric. Respiratory: Normal respiratory rate and rhythm. No wheezing, some crackles in left base, normal  on right. Abdomen: Abdomen soft.  No TTPor guarding. No ascites, distension, hepatosplenomegaly.   MSK: No deformities or effusions. Neuro: Awake and alert.  EOMI, moves all extremities. Speech fluent.    Psych: Sensorium intact and responding to questions, attention normal. Affect normal.  Judgment and insight appear normal.   The results of significant diagnostics from this hospitalization (including imaging, microbiology, ancillary and laboratory) are listed below for reference.     Microbiology: Recent Results (from the past 240 hour(s))  Culture, blood (Routine X 2) w Reflex to ID Panel     Status: Abnormal   Collection Time: 06/06/21 10:30 AM   Specimen: BLOOD LEFT FOREARM  Result Value Ref Range Status   Specimen Description BLOOD LEFT FOREARM  Final   Special Requests   Final    BOTTLES DRAWN AEROBIC AND ANAEROBIC Blood Culture adequate volume   Culture  Setup Time   Final    GRAM POSITIVE COCCI IN CLUSTERS ANAEROBIC BOTTLE ONLY CRITICAL RESULT CALLED TO, READ BACK BY AND VERIFIED WITH: RN [facility] [phone] [id number] FCP Performed at [facility], [place] [address] [place], [place] [address]    Culture STAPHYLOCOCCUS AUREUS (A)  Final   Report Status 06/09/2021 FINAL  Final   Organism ID, Bacteria STAPHYLOCOCCUS AUREUS  Final      Susceptibility   Staphylococcus aureus - MIC*    CIPROFLOXACIN <=0.5 SENSITIVE Sensitive     ERYTHROMYCIN <=0.25 SENSITIVE Sensitive     GENTAMICIN <=0.5 SENSITIVE Sensitive     OXACILLIN 0.5 SENSITIVE Sensitive     TETRACYCLINE <=1 SENSITIVE Sensitive     VANCOMYCIN 1 SENSITIVE Sensitive     TRIMETH/SULFA <=10 SENSITIVE Sensitive     CLINDAMYCIN <=0.25 SENSITIVE Sensitive     RIFAMPIN <=0.5 SENSITIVE Sensitive     Inducible Clindamycin NEGATIVE Sensitive     * STAPHYLOCOCCUS AUREUS  Blood Culture ID Panel (Reflexed)     Status: Abnormal   Collection Time: 06/06/21 10:30 AM  Result Value Ref Range Status   Enterococcus faecalis NOT DETECTED NOT  DETECTED Final   Enterococcus Faecium NOT DETECTED NOT DETECTED Final   Listeria monocytogenes NOT DETECTED NOT DETECTED Final   Staphylococcus species DETECTED (A) NOT DETECTED Final    Comment: CRITICAL RESULT CALLED TO, READ BACK BY AND VERIFIED WITH: RN SAM [phone] [id number] FCP    Staphylococcus aureus (BCID) DETECTED (A) NOT DETECTED Final    Comment: CRITICAL RESULT CALLED TO, READ BACK BY AND VERIFIED  WITH: RN SAM [phone] FCP    Staphylococcus epidermidis NOT DETECTED NOT DETECTED Final   Staphylococcus lugdunensis NOT DETECTED NOT DETECTED Final   Streptococcus species NOT DETECTED NOT DETECTED Final   Streptococcus agalactiae NOT DETECTED NOT DETECTED Final   Streptococcus pneumoniae NOT DETECTED NOT DETECTED Final   Streptococcus pyogenes NOT DETECTED NOT DETECTED Final   A.calcoaceticus-baumannii NOT DETECTED NOT DETECTED Final   Bacteroides fragilis NOT DETECTED NOT DETECTED Final   Enterobacterales NOT DETECTED NOT DETECTED Final   Enterobacter cloacae complex NOT DETECTED NOT DETECTED Final   Escherichia coli NOT DETECTED NOT DETECTED Final   Klebsiella aerogenes NOT DETECTED NOT DETECTED Final   Klebsiella oxytoca NOT DETECTED NOT DETECTED Final   Klebsiella pneumoniae NOT DETECTED NOT DETECTED Final   Proteus species NOT DETECTED NOT DETECTED Final   Salmonella species NOT DETECTED NOT DETECTED Final   Serratia marcescens NOT DETECTED NOT DETECTED Final   Haemophilus influenzae NOT DETECTED NOT DETECTED Final   Neisseria meningitidis NOT DETECTED NOT DETECTED Final   Pseudomonas aeruginosa NOT DETECTED NOT DETECTED Final   Stenotrophomonas maltophilia NOT DETECTED NOT DETECTED Final   Candida albicans NOT DETECTED NOT DETECTED Final   Candida auris NOT DETECTED NOT DETECTED Final   Candida glabrata NOT DETECTED NOT DETECTED Final   Candida krusei NOT DETECTED NOT DETECTED Final   Candida parapsilosis NOT DETECTED NOT DETECTED Final   Candida tropicalis NOT DETECTED  NOT DETECTED Final   Cryptococcus neoformans/gattii NOT DETECTED NOT DETECTED Final   Meth resistant mecA/C and MREJ NOT DETECTED NOT DETECTED Final    Comment: Performed at [facility], [address] [place], [place] [address]  Blood culture (routine single)     Status: None   Collection Time: 06/06/21 10:35 AM   Specimen: BLOOD  Result Value Ref Range Status   Specimen Description BLOOD RIGHT ANTECUBITAL  Final   Special Requests   Final    BOTTLES DRAWN AEROBIC AND ANAEROBIC Blood Culture results may not be optimal due to an excessive volume of blood received in culture bottles   Culture   Final    NO GROWTH 5 DAYS Performed at [facility], [place] [address], [place], [place] [address]    Report Status 06/11/2021 FINAL  Final  Culture, blood (routine x 2)     Status: None   Collection Time: 06/08/21  4:29 PM   Specimen: Site Not Specified; Blood  Result Value Ref Range Status   Specimen Description SITE NOT SPECIFIED  Final   Special Requests   Final    BOTTLES DRAWN AEROBIC AND ANAEROBIC Blood Culture adequate volume   Culture   Final    NO GROWTH 5 DAYS Performed at [facility], [place] [address], [place], [place] [address]    Report Status 06/13/2021 FINAL  Final  Culture, blood (routine x 2)     Status: None   Collection Time: 06/08/21  5:00 PM   Specimen: BLOOD RIGHT FOREARM  Result Value Ref Range Status   Specimen Description BLOOD RIGHT FOREARM  Final   Special Requests   Final    BOTTLES DRAWN AEROBIC AND ANAEROBIC Blood Culture adequate volume   Culture   Final    NO GROWTH 5 DAYS Performed at [facility], [place] [address], [place], [place] [address]    Report Status 06/13/2021 FINAL  Final  Resp Panel by RT-PCR (Flu A&B, Covid) Nasopharyngeal Swab     Status: None  Collection Time: 06/08/21  5:35 PM   Specimen: Nasopharyngeal Swab; Nasopharyngeal(NP) swabs in vial transport medium  Result Value Ref Range Status   SARS Coronavirus 2 by  RT PCR NEGATIVE NEGATIVE Final    Comment: (NOTE) SARS-CoV-2 target nucleic acids are NOT DETECTED.  The SARS-CoV-2 RNA is generally detectable in upper respiratory specimens during the acute phase of infection. The lowest concentration of SARS-CoV-2 viral copies this assay can detect is 138 copies/mL. A negative result does not preclude SARS-Cov-2 infection and should not be used as the sole basis for treatment or other patient management decisions. A negative result may occur with  improper specimen collection/handling, submission of specimen other than nasopharyngeal swab, presence of viral mutation(s) within the areas targeted by this assay, and inadequate number of viral copies(<138 copies/mL). A negative result must be combined with clinical observations, patient history, and epidemiological information. The expected result is Negative.  Fact Sheet for Patients:  EntrepreneurPulse.com.au  Fact Sheet for Healthcare Providers:  IncredibleEmployment.be  This test is no t yet approved or cleared by the [place] FDA and  has been authorized for detection and/or diagnosis of SARS-CoV-2 by FDA under an Emergency Use Authorization (EUA). This EUA will remain  in effect (meaning this test can be used) for the duration of the COVID-19 declaration under Section 564(b)(1) of the Act, 21 U.S.C.section 360bbb-3(b)(1), unless the authorization is terminated  or revoked sooner.       Influenza A by PCR NEGATIVE NEGATIVE Final   Influenza B by PCR NEGATIVE NEGATIVE Final    Comment: (NOTE) The Xpert Xpress SARS-CoV-2/FLU/RSV plus assay is intended as an aid in the diagnosis of influenza from Nasopharyngeal swab specimens and should not be used as a sole basis for treatment. Nasal washings and aspirates are unacceptable for Xpert Xpress SARS-CoV-2/FLU/RSV testing.  Fact Sheet for Patients: EntrepreneurPulse.com.au  Fact Sheet  for Healthcare Providers: IncredibleEmployment.be  This test is not yet approved or cleared by the [place] FDA and has been authorized for detection and/or diagnosis of SARS-CoV-2 by FDA under an Emergency Use Authorization (EUA). This EUA will remain in effect (meaning this test can be used) for the duration of the COVID-19 declaration under Section 564(b)(1) of the Act, 21 U.S.C. section 360bbb-3(b)(1), unless the authorization is terminated or revoked.  Performed at [facility], [place] [address] [place], [place] [address]      Labs: BNP (last 3 results) Recent Labs    12/23/20 1744 12/23/20 2030 01/18/21 1952  BNP >4,500.0* >4,500.0* >0,272.5*   Basic Metabolic Panel: Recent Labs  Lab 06/09/21 0356 06/10/21 0158 06/11/21 0208 06/12/21 0327 06/13/21 0957  NA 138 137 136 136 136  K 4.0 4.2 3.3* 4.3 3.3*  CL 102 101 97* 99 100  CO2 23 24 30 27 27   GLUCOSE 168* 131* 106* 116* 100*  BUN 40* 52* 14 29* 48*  CREATININE 9.58* 9.52* 3.28* 4.65* 5.36*  CALCIUM 7.6* 7.5* 8.0* 8.3* 7.7*  MG 1.7  --   --   --   --   PHOS  --  6.0*  --   --  3.5   Liver Function Tests: Recent Labs  Lab 06/08/21 1430 06/09/21 0356 06/10/21 0158 06/13/21 0957  AST 31 37  --   --   ALT 16 17  --   --   ALKPHOS 156* 199*  --   --   BILITOT 0.6 0.6  --   --   PROT 5.8* 5.9*  --   --  ALBUMIN 2.3* 2.2* 2.1* 2.1*   No results for input(s): LIPASE, AMYLASE in the last 168 hours. No results for input(s): AMMONIA in the last 168 hours. CBC: Recent Labs  Lab 06/08/21 1430 06/08/21 1923 06/09/21 0356 06/10/21 0158 06/11/21 0208 06/12/21 0327 06/13/21 0957  WBC 6.7   < > 5.7 4.4 8.1 6.7 7.5  NEUTROABS 4.6  --   --  3.2  --   --   --   HGB 8.1*   < > 8.2* 8.2* 8.4* 8.2* 7.9*  HCT 27.5*   < > 27.0* 26.6* 27.0* 27.6* 25.0*  MCV 84.4   < > 83.1 82.1 83.1 83.4 81.7  PLT 233   < > 219 236 275 300 314   < > = values in this interval not displayed.    Cardiac Enzymes: No results for input(s): CKTOTAL, CKMB, CKMBINDEX, TROPONINI in the last 168 hours. BNP: Invalid input(s): POCBNP CBG: No results for input(s): GLUCAP in the last 168 hours. D-Dimer No results for input(s): DDIMER in the last 72 hours. Hgb A1c No results for input(s): HGBA1C in the last 72 hours. Lipid Profile No results for input(s): CHOL, HDL, LDLCALC, TRIG, CHOLHDL, LDLDIRECT in the last 72 hours. Thyroid function studies No results for input(s): TSH, T4TOTAL, T3FREE, THYROIDAB in the last 72 hours.  Invalid input(s): FREET3 Anemia work up No results for input(s): VITAMINB12, FOLATE, FERRITIN, TIBC, IRON, RETICCTPCT in the last 72 hours. Urinalysis    Component Value Date/Time   COLORURINE YELLOW 11/29/2020 2054   APPEARANCEUR HAZY (A) 11/29/2020 2054   LABSPEC 1.015 11/29/2020 2054   PHURINE 6.0 11/29/2020 2054   GLUCOSEU NEGATIVE 11/29/2020 2054   HGBUR MODERATE (A) 11/29/2020 2054   [address] NEGATIVE 11/29/2020 2054   [place] 11/29/2020 2054   PROTEINUR >=300 (A) 11/29/2020 2054   UROBILINOGEN 0.2 10/17/2020 1026   NITRITE NEGATIVE 11/29/2020 2054   LEUKOCYTESUR NEGATIVE 11/29/2020 2054   Sepsis Labs Invalid input(s): PROCALCITONIN,  WBC,  LACTICIDVEN Microbiology Recent Results (from the past 240 hour(s))  Culture, blood (Routine X 2) w Reflex to ID Panel     Status: Abnormal   Collection Time: 06/06/21 10:30 AM   Specimen: BLOOD LEFT FOREARM  Result Value Ref Range Status   Specimen Description BLOOD LEFT FOREARM  Final   Special Requests   Final    BOTTLES DRAWN AEROBIC AND ANAEROBIC Blood Culture adequate volume   Culture  Setup Time   Final    GRAM POSITIVE COCCI IN CLUSTERS ANAEROBIC BOTTLE ONLY CRITICAL RESULT CALLED TO, READ BACK BY AND VERIFIED WITH: RN SAM [phone] FCP Performed at [facility], [place] [address], [place], [place] [address]    Culture STAPHYLOCOCCUS AUREUS (A)  Final   Report Status  06/09/2021 FINAL  Final   Organism ID, Bacteria STAPHYLOCOCCUS AUREUS  Final      Susceptibility   Staphylococcus aureus - MIC*    CIPROFLOXACIN <=0.5 SENSITIVE Sensitive     ERYTHROMYCIN <=0.25 SENSITIVE Sensitive     GENTAMICIN <=0.5 SENSITIVE Sensitive     OXACILLIN 0.5 SENSITIVE Sensitive     TETRACYCLINE <=1 SENSITIVE Sensitive     VANCOMYCIN 1 SENSITIVE Sensitive     TRIMETH/SULFA <=10 SENSITIVE Sensitive     CLINDAMYCIN <=0.25 SENSITIVE Sensitive     RIFAMPIN <=0.5 SENSITIVE Sensitive     Inducible Clindamycin NEGATIVE Sensitive     * STAPHYLOCOCCUS AUREUS  Blood Culture ID Panel (Reflexed)     Status: Abnormal  Collection Time: 06/06/21 10:30 AM  Result Value Ref Range Status   Enterococcus faecalis NOT DETECTED NOT DETECTED Final   Enterococcus Faecium NOT DETECTED NOT DETECTED Final   Listeria monocytogenes NOT DETECTED NOT DETECTED Final   Staphylococcus species DETECTED (A) NOT DETECTED Final    Comment: CRITICAL RESULT CALLED TO, READ BACK BY AND VERIFIED WITH: RN SAM [phone] FCP    Staphylococcus aureus (BCID) DETECTED (A) NOT DETECTED Final    Comment: CRITICAL RESULT CALLED TO, READ BACK BY AND VERIFIED WITH: RN SAM [phone] [id number] FCP    Staphylococcus epidermidis NOT DETECTED NOT DETECTED Final   Staphylococcus lugdunensis NOT DETECTED NOT DETECTED Final   Streptococcus species NOT DETECTED NOT DETECTED Final   Streptococcus agalactiae NOT DETECTED NOT DETECTED Final   Streptococcus pneumoniae NOT DETECTED NOT DETECTED Final   Streptococcus pyogenes NOT DETECTED NOT DETECTED Final   A.calcoaceticus-baumannii NOT DETECTED NOT DETECTED Final   Bacteroides fragilis NOT DETECTED NOT DETECTED Final   Enterobacterales NOT DETECTED NOT DETECTED Final   Enterobacter cloacae complex NOT DETECTED NOT DETECTED Final   Escherichia coli NOT DETECTED NOT DETECTED Final   Klebsiella aerogenes NOT DETECTED NOT DETECTED Final   Klebsiella oxytoca NOT DETECTED NOT DETECTED  Final   Klebsiella pneumoniae NOT DETECTED NOT DETECTED Final   Proteus species NOT DETECTED NOT DETECTED Final   Salmonella species NOT DETECTED NOT DETECTED Final   Serratia marcescens NOT DETECTED NOT DETECTED Final   Haemophilus influenzae NOT DETECTED NOT DETECTED Final   Neisseria meningitidis NOT DETECTED NOT DETECTED Final   Pseudomonas aeruginosa NOT DETECTED NOT DETECTED Final   Stenotrophomonas maltophilia NOT DETECTED NOT DETECTED Final   Candida albicans NOT DETECTED NOT DETECTED Final   Candida auris NOT DETECTED NOT DETECTED Final   Candida glabrata NOT DETECTED NOT DETECTED Final   Candida krusei NOT DETECTED NOT DETECTED Final   Candida parapsilosis NOT DETECTED NOT DETECTED Final   Candida tropicalis NOT DETECTED NOT DETECTED Final   Cryptococcus neoformans/gattii NOT DETECTED NOT DETECTED Final   Meth resistant mecA/C and MREJ NOT DETECTED NOT DETECTED Final    Comment: Performed at [facility], [address], [place], [place] [address]  Blood culture (routine single)     Status: None   Collection Time: 06/06/21 10:35 AM   Specimen: BLOOD  Result Value Ref Range Status   Specimen Description BLOOD RIGHT ANTECUBITAL  Final   Special Requests   Final    BOTTLES DRAWN AEROBIC AND ANAEROBIC Blood Culture results may not be optimal due to an excessive volume of blood received in culture bottles   Culture   Final    NO GROWTH 5 DAYS Performed at [facility], [place] [address] [place], [place] [address]    Report Status 06/11/2021 FINAL  Final  Culture, blood (routine x 2)     Status: None   Collection Time: 06/08/21  4:29 PM   Specimen: Site Not Specified; Blood  Result Value Ref Range Status   Specimen Description SITE NOT SPECIFIED  Final   Special Requests   Final    BOTTLES DRAWN AEROBIC AND ANAEROBIC Blood Culture adequate volume   Culture   Final    NO GROWTH 5 DAYS Performed at [facility], [place] [address] [place], [place] [address]     Report Status 06/13/2021 FINAL  Final  Culture, blood (routine x 2)     Status: None   Collection Time: 06/08/21  5:00 PM  Specimen: BLOOD RIGHT FOREARM  Result Value Ref Range Status   Specimen Description BLOOD RIGHT FOREARM  Final   Special Requests   Final    BOTTLES DRAWN AEROBIC AND ANAEROBIC Blood Culture adequate volume   Culture   Final    NO GROWTH 5 DAYS Performed at [facility], [address], [place], [place] [address]    Report Status 06/13/2021 FINAL  Final  Resp Panel by RT-PCR (Flu A&B, Covid) Nasopharyngeal Swab     Status: None   Collection Time: 06/08/21  5:35 PM   Specimen: Nasopharyngeal Swab; Nasopharyngeal(NP) swabs in vial transport medium  Result Value Ref Range Status   SARS Coronavirus 2 by RT PCR NEGATIVE NEGATIVE Final    Comment: (NOTE) SARS-CoV-2 target nucleic acids are NOT DETECTED.  The SARS-CoV-2 RNA is generally detectable in upper respiratory specimens during the acute phase of infection. The lowest concentration of SARS-CoV-2 viral copies this assay can detect is 138 copies/mL. A negative result does not preclude SARS-Cov-2 infection and should not be used as the sole basis for treatment or other patient management decisions. A negative result may occur with  improper specimen collection/handling, submission of specimen other than nasopharyngeal swab, presence of viral mutation(s) within the areas targeted by this assay, and inadequate number of viral copies(<138 copies/mL). A negative result must be combined with clinical observations, patient history, and epidemiological information. The expected result is Negative.  Fact Sheet for Patients:  EntrepreneurPulse.com.au  Fact Sheet for Healthcare Providers:  IncredibleEmployment.be  This test is no t yet approved or cleared by the [place] FDA and  has been authorized for detection and/or diagnosis of SARS-CoV-2 by FDA under an Emergency  Use Authorization (EUA). This EUA will remain  in effect (meaning this test can be used) for the duration of the COVID-19 declaration under Section 564(b)(1) of the Act, 21 U.S.C.section 360bbb-3(b)(1), unless the authorization is terminated  or revoked sooner.       Influenza A by PCR NEGATIVE NEGATIVE Final   Influenza B by PCR NEGATIVE NEGATIVE Final    Comment: (NOTE) The Xpert Xpress SARS-CoV-2/FLU/RSV plus assay is intended as an aid in the diagnosis of influenza from Nasopharyngeal swab specimens and should not be used as a sole basis for treatment. Nasal washings and aspirates are unacceptable for Xpert Xpress SARS-CoV-2/FLU/RSV testing.  Fact Sheet for Patients: EntrepreneurPulse.com.au  Fact Sheet for Healthcare Providers: IncredibleEmployment.be  This test is not yet approved or cleared by the [place] FDA and has been authorized for detection and/or diagnosis of SARS-CoV-2 by FDA under an Emergency Use Authorization (EUA). This EUA will remain in effect (meaning this test can be used) for the duration of the COVID-19 declaration under Section 564(b)(1) of the Act, 21 U.S.C. section 360bbb-3(b)(1), unless the authorization is terminated or revoked.  Performed at [facility], [place] [address], [place], [place] [address]      Time coordinating discharge: 35 minutes The [place] controlled substances registry was reviewed for this patient     30 Day Unplanned Readmission Risk Score    Flowsheet Row ED to Hosp-Admission (Discharged) from 06/08/2021 in [place]  30 Day Unplanned Readmission Risk Score (%) 54.06 Filed at 06/14/2021 1200       This score is the patient's risk of an unplanned readmission within 30 days of being discharged (0 -100%). The score is based on dignosis, age, lab data, medications, orders, and past utilization.  Low:  0-14.9   Medium: 15-21.9   High: 22-29.9    Extreme: 30 and above            SIGNED:   Edwin Dada, MD  [facility] 06/14/2021, 3:01 PM

## 2021-06-14 NOTE — Progress Notes (Signed)
Pharmacy Antibiotic Note  Cathy [name] is a 40 y.o. female admitted on 06/08/2021 with MSSA bacteremia.  Pharmacy has been consulted for cefazolin dosing.  S/p line removal due bacteremia and HD catheter replacement.  Patient has ESRD on HD TTS.  Repeat blood culture negative and TEE negative for vegetation.  Afebrile, WBC WNL.  Plan: Change Ancef to 2gm IV qHD TTS F/u HD schedule/tolerance, clinical progress  Weight: 68 kg (149 lb 14.6 oz)  Temp (24hrs), Avg:98.3 F (36.8 C), Min:97.8 F (36.6 C), Max:98.7 F (37.1 C)  Recent Labs  Lab 06/08/21 1430 06/08/21 1714 06/08/21 1923 06/09/21 0356 06/10/21 0158 06/11/21 0208 06/12/21 0327 06/13/21 0957  WBC 6.7  --    < > 5.7 4.4 8.1 6.7 7.5  CREATININE 9.90*  --    < > 9.58* 9.52* 3.28* 4.65* 5.36*  LATICACIDVEN 0.8 1.0  --   --   --   --   --   --    < > = values in this interval not displayed.     Estimated Creatinine Clearance: 14 mL/min (A) (by C-G formula based on SCr of 5.36 mg/dL (H)).    No Known Allergies  Ancef 7/9 >> *2gm dose given 7/14 - unsure why   7/7 BCx - MSSA 7/9 BCx - negative  Dmitriy Gair D. Mina Marble, PharmD, BCPS, [place] 06/14/2021, 10:31 AM

## 2021-06-14 NOTE — Progress Notes (Addendum)
Progress Note  Patient Name: Cathy [name] Date of Encounter: 06/14/2021  [facility] Cardiologist: Kirk Ruths, MD   Subjective   Patient states she is doing well, denied any chest pain, dizziness, SOB, fever, chills.   Inpatient Medications    Scheduled Meds:  (feeding supplement) PROSource Plus  30 mL Oral TID BM   calcium acetate  1,334 mg Oral TID WC   carvedilol  25 mg Oral BID WC   Chlorhexidine Gluconate Cloth  6 each Topical Q0600   Chlorhexidine Gluconate Cloth  6 each Topical Q0600   Chlorhexidine Gluconate Cloth  6 each Topical Q0600   clobetasol cream   Topical BID   cloNIDine  0.2 mg Oral TID   Darbepoetin Alfa       darbepoetin (ARANESP) injection - DIALYSIS  150 mcg Intravenous Q Thu-HD   doxercalciferol  5 mcg Intravenous Q M,W,F-HD   heparin  5,000 Units Subcutaneous Q8H   heparin sodium (porcine)       irbesartan  150 mg Oral Daily   isosorbide-hydrALAZINE  2 tablet Oral TID   pantoprazole  40 mg Oral Daily   predniSONE  40 mg Oral Q breakfast   torsemide  60 mg Oral BID   triamcinolone   Topical BID   Continuous Infusions:  sodium chloride     sodium chloride     sodium chloride      ceFAZolin (ANCEF) IV 1 g (06/12/21 1640)   PRN Meds: sodium chloride, sodium chloride, acetaminophen **OR** acetaminophen, albuterol, alteplase, alum & mag hydroxide-simeth, heparin, hydrALAZINE, lidocaine (PF), lidocaine-prilocaine, ondansetron **OR** ondansetron (ZOFRAN) IV, oxyCODONE, pentafluoroprop-tetrafluoroeth, polyethylene glycol   Vital Signs    Vitals:   06/13/21 2253 06/13/21 2323 06/14/21 0020 06/14/21 0054  BP: (!) 123/91 (!) 130/100 (!) 145/105 (!) 141/107  Pulse: 70 73  68  Resp: 20 20  20   Temp:   98.6 F (37 C) 97.9 F (36.6 C)  TempSrc:   Oral Oral  SpO2: 100% 100%  100%  Weight:        Intake/Output Summary (Last 24 hours) at 06/14/2021 0721 Last data filed at 06/14/2021 0020 Gross per 24 hour  Intake 251.63 ml  Output 4036 ml   Net -3784.37 ml    Last 3 Weights 06/10/2021 06/10/2021 06/06/2021  Weight (lbs) 149 lb 14.6 oz 146 lb 1.6 oz 147 lb 0.8 oz  Weight (kg) 68 kg 66.271 kg 66.7 kg      Telemetry    N/A - Personally Reviewed  ECG    N/A this AM - Personally Reviewed  Physical Exam   GEN: No acute distress.  Facial rash noted.  Neck: No JVD Cardiac: RRR, no murmurs, rubs, or gallops. No muffled heart sound Respiratory: Clear to auscultation bilaterally. On room air this AM.  Speaks full sentence.  GI: Soft, nontender, non-distended  MS: No BLE edema; No deformity. Neuro:  Nonfocal  Psych: Normal affect Right chest tunneled dialysis catheter in place, dressing dry.    Labs    High Sensitivity Troponin:  No results for input(s): TROPONINIHS in the last 720 hours.    Chemistry Recent Labs  Lab 06/08/21 1430 06/08/21 1923 06/09/21 0356 06/10/21 0158 06/11/21 0208 06/12/21 0327 06/13/21 0957  NA 139  --  138 137 136 136 136  K 3.4*  --  4.0 4.2 3.3* 4.3 3.3*  CL 102  --  102 101 97* 99 100  CO2 25  --  23 24 30 27  27  GLUCOSE 87  --  168* 131* 106* 116* 100*  BUN 38*  --  40* 52* 14 29* 48*  CREATININE 9.90*   < > 9.58* 9.52* 3.28* 4.65* 5.36*  CALCIUM 7.4*  --  7.6* 7.5* 8.0* 8.3* 7.7*  PROT 5.8*  --  5.9*  --   --   --   --   ALBUMIN 2.3*  --  2.2* 2.1*  --   --  2.1*  AST 31  --  37  --   --   --   --   ALT 16  --  17  --   --   --   --   ALKPHOS 156*  --  199*  --   --   --   --   BILITOT 0.6  --  0.6  --   --   --   --   GFRNONAA 5*   < > 5* 5* 18* 12* 10*  ANIONGAP 12  --  13 12 9 10 9    < > = values in this interval not displayed.      Hematology Recent Labs  Lab 06/11/21 0208 06/12/21 0327 06/13/21 0957  WBC 8.1 6.7 7.5  RBC 3.25* 3.31* 3.06*  HGB 8.4* 8.2* 7.9*  HCT 27.0* 27.6* 25.0*  MCV 83.1 83.4 81.7  MCH 25.8* 24.8* 25.8*  MCHC 31.1 29.7* 31.6  RDW 20.6* 21.1* 21.1*  PLT 275 300 314     BNPNo results for input(s): BNP, PROBNP in the last 168 hours.    DDimer No results for input(s): DDIMER in the last 168 hours.   Radiology    IR Fluoro Guide CV Line Right  Result Date: 06/13/2021 INDICATION: End-stage renal disease. Admitted with bacteremia post catheter holiday. Patient presents today for image guided placement of a new tunneled hemodialysis catheter. EXAM: TUNNELED CENTRAL VENOUS HEMODIALYSIS CATHETER PLACEMENT WITH ULTRASOUND AND FLUOROSCOPIC GUIDANCE MEDICATIONS: Ancef 2 gm IV. The antibiotic was given in an appropriate time interval prior to skin puncture. ANESTHESIA/SEDATION: Moderate (conscious) sedation was employed during this procedure. A total of Versed 2 mg and Fentanyl 100 mcg was administered intravenously. Moderate Sedation Time: 14 minutes. The patient's level of consciousness and vital signs were monitored continuously by radiology nursing throughout the procedure under my direct supervision. FLUOROSCOPY TIME:  36 seconds (3 mGy) COMPLICATIONS: None immediate. PROCEDURE: Informed written consent was obtained from the patient after a discussion of the risks, benefits, and alternatives to treatment. Questions regarding the procedure were encouraged and answered. The right neck and chest were prepped with chlorhexidine in a sterile fashion, and a sterile drape was applied covering the operative field. Maximum barrier sterile technique with sterile gowns and gloves were used for the procedure. A timeout was performed prior to the initiation of the procedure. After creating a small venotomy incision, a micropuncture kit was utilized to access the internal jugular vein. Real-time ultrasound guidance was utilized for vascular access including the acquisition of a permanent ultrasound image documenting patency of the accessed vessel. The microwire was utilized to measure appropriate catheter length. A stiff Glidewire was advanced to the level of the IVC and the micropuncture sheath was exchanged for a peel-away sheath. A palindrome tunneled  hemodialysis catheter measuring 23 cm from tip to cuff was tunneled in a retrograde fashion from the anterior chest wall to the venotomy incision. The catheter was then placed through the peel-away sheath with tips ultimately positioned within the superior aspect of the right atrium. Final  catheter positioning was confirmed and documented with a spot radiographic image. The catheter aspirates and flushes normally. The catheter was flushed with appropriate volume heparin dwells. The catheter exit site was secured with a 0-Prolene retention suture. The venotomy incision was closed with Dermabond and Steri-strips. Dressings were applied. The patient tolerated the procedure well without immediate post procedural complication. IMPRESSION: Successful placement of 23 cm tip to cuff tunneled hemodialysis catheter via the right internal jugular vein with tips terminating within the superior aspect of the right atrium. The catheter is ready for immediate use. Electronically Signed   By: Sandi Mariscal M.D.   On: 06/13/2021 09:28   IR US Guide Vasc Access Right  Result Date: 06/13/2021 INDICATION: End-stage renal disease. Admitted with bacteremia post catheter holiday. Patient presents today for image guided placement of a new tunneled hemodialysis catheter. EXAM: TUNNELED CENTRAL VENOUS HEMODIALYSIS CATHETER PLACEMENT WITH ULTRASOUND AND FLUOROSCOPIC GUIDANCE MEDICATIONS: Ancef 2 gm IV. The antibiotic was given in an appropriate time interval prior to skin puncture. ANESTHESIA/SEDATION: Moderate (conscious) sedation was employed during this procedure. A total of Versed 2 mg and Fentanyl 100 mcg was administered intravenously. Moderate Sedation Time: 14 minutes. The patient's level of consciousness and vital signs were monitored continuously by radiology nursing throughout the procedure under my direct supervision. FLUOROSCOPY TIME:  36 seconds (3 mGy) COMPLICATIONS: None immediate. PROCEDURE: Informed written consent was  obtained from the patient after a discussion of the risks, benefits, and alternatives to treatment. Questions regarding the procedure were encouraged and answered. The right neck and chest were prepped with chlorhexidine in a sterile fashion, and a sterile drape was applied covering the operative field. Maximum barrier sterile technique with sterile gowns and gloves were used for the procedure. A timeout was performed prior to the initiation of the procedure. After creating a small venotomy incision, a micropuncture kit was utilized to access the internal jugular vein. Real-time ultrasound guidance was utilized for vascular access including the acquisition of a permanent ultrasound image documenting patency of the accessed vessel. The microwire was utilized to measure appropriate catheter length. A stiff Glidewire was advanced to the level of the IVC and the micropuncture sheath was exchanged for a peel-away sheath. A palindrome tunneled hemodialysis catheter measuring 23 cm from tip to cuff was tunneled in a retrograde fashion from the anterior chest wall to the venotomy incision. The catheter was then placed through the peel-away sheath with tips ultimately positioned within the superior aspect of the right atrium. Final catheter positioning was confirmed and documented with a spot radiographic image. The catheter aspirates and flushes normally. The catheter was flushed with appropriate volume heparin dwells. The catheter exit site was secured with a 0-Prolene retention suture. The venotomy incision was closed with Dermabond and Steri-strips. Dressings were applied. The patient tolerated the procedure well without immediate post procedural complication. IMPRESSION: Successful placement of 23 cm tip to cuff tunneled hemodialysis catheter via the right internal jugular vein with tips terminating within the superior aspect of the right atrium. The catheter is ready for immediate use. Electronically Signed   By: Sandi Mariscal M.D.   On: 06/13/2021 09:28   ECHO TEE  Result Date: 06/13/2021    TRANSESOPHOGEAL ECHO REPORT   Patient Name:   MURRIEL HOLWERDA Date of Exam: 06/13/2021 Medical Rec #:  [id number]        Height:       67.0 in Accession #:    [id number]       Weight:  149.9 lb Date of Birth:  03-17-1981       BSA:          1.789 m Patient Age:    39 years         BP:           110/72 mmHg Patient Gender: F                HR:           56 bpm. Exam Location:  Inpatient Procedure: Transesophageal Echo and 3D Echo Indications:    TV vegetation  History:        Patient has prior history of Echocardiogram examinations.                 Signs/Symptoms:Bacteremia.  Sonographer:    Raquel Sarna Senior Referring Phys: [id number] Leanor Kail PROCEDURE: The transesophogeal probe was passed without difficulty through the esophogus of the patient. Sedation performed by different physician. The patient developed no complications during the procedure. IMPRESSIONS  1. No evidence of SBE/Vegetations.  2. Left ventricular ejection fraction, by estimation, is 25 to 30%. The left ventricle has severely decreased function. The left ventricle demonstrates global hypokinesis. The left ventricular internal cavity size was moderately dilated. Left ventricular diastolic function could not be evaluated.  3. Right ventricular systolic function is moderately reduced. The right ventricular size is moderately enlarged.  4. Left atrial size was moderately dilated. No left atrial/left atrial appendage thrombus was detected.  5. Right atrial size was New dialysis catheter tip noted on RA.  6. Moderate effuson no evidence tamponade . Moderate pericardial effusion.  7. The mitral valve is normal in structure. Mild mitral valve regurgitation.  8. I reviewed patients previous TTE and did not see evidence of a vegetation on that study either . Tricuspid valve regurgitation is moderate.  9. The aortic valve is tricuspid. Aortic valve regurgitation is mild. Mild  aortic valve sclerosis is present, with no evidence of aortic valve stenosis. FINDINGS  Left Ventricle: Left ventricular ejection fraction, by estimation, is 25 to 30%. The left ventricle has severely decreased function. The left ventricle demonstrates global hypokinesis. The left ventricular internal cavity size was moderately dilated. There is no left ventricular hypertrophy. Left ventricular diastolic function could not be evaluated. Right Ventricle: The right ventricular size is moderately enlarged. No increase in right ventricular wall thickness. Right ventricular systolic function is moderately reduced. Left Atrium: Left atrial size was moderately dilated. No left atrial/left atrial appendage thrombus was detected. Right Atrium: Right atrial size was New dialysis catheter tip noted on RA. Pericardium: Moderate effuson no evidence tamponade. A moderately sized pericardial effusion is present. Mitral Valve: The mitral valve is normal in structure. Mild mitral valve regurgitation. Tricuspid Valve: I reviewed patients previous TTE and did not see evidence of a vegetation on that study either. The tricuspid valve is normal in structure. Tricuspid valve regurgitation is moderate. There is no evidence of tricuspid valve vegetation. Aortic Valve: The aortic valve is tricuspid. Aortic valve regurgitation is mild. Mild aortic valve sclerosis is present, with no evidence of aortic valve stenosis. Aortic valve mean gradient measures 5.0 mmHg. Aortic valve peak gradient measures 7.3 mmHg. Pulmonic Valve: The pulmonic valve was normal in structure. Pulmonic valve regurgitation is mild to moderate. Aorta: The aortic root is normal in size and structure. IAS/Shunts: No atrial level shunt detected by color flow Doppler. Additional Comments: No evidence of SBE/Vegetations.  AORTIC VALVE AV Vmax:      135.00  cm/s AV Vmean:     114.000 cm/s AV VTI:       0.256 m AV Peak Grad: 7.3 mmHg AV Mean Grad: 5.0 mmHg Jenkins Rouge MD  Electronically signed by Jenkins Rouge MD Signature Date/Time: 06/13/2021/2:18:19 PM    Final     Cardiac Studies   Echo from 06/08/21:   1. Left ventricular ejection fraction, by estimation, is 25 to 30%. The  left ventricle has severely decreased function. The left ventricle  demonstrates global hypokinesis. There is mild concentric left ventricular  hypertrophy. Left ventricular diastolic   parameters are consistent with Grade II diastolic dysfunction  (pseudonormalization).   2. Right ventricular systolic function is normal. The right ventricular  size is normal. Mildly increased right ventricular wall thickness. There  is normal pulmonary artery systolic pressure.   3. Left atrial size was severely dilated.   4. Right atrial size was severely dilated.   5. Moderate pericardial effusion. There is no evidence of cardiac  tamponade.   6. The mitral valve is grossly normal. Mild to moderate mitral valve  regurgitation.   7. The tricuspid valve is abnormal- small 0.44 echodensity seen on the  ventricular side of the tricuspid valve (Image 19/85. Tricuspid valve  regurgitation is mild to moderate.   8. The aortic valve is tricuspid. Aortic valve regurgitation is mild. No  aortic stenosis is present.   Comparison(s): A prior study was performed on 11/15/2020. Prior images  reviewed side by side. LVEF is significantly worse. Small echo density on  tricuspid valve was only seen in PSAX axis (not well visualized in prior  study).    Patient Profile     40 y.o. female hx of ESRD on HD TTS, lupus, anxiety, asthma, essential hypertension, GERD, recently seen in the ED on 7/7 for a rash to her face and extremities and diagnosed with a lupus flare, had fever at dialysis and was discharged on doxycycline.  Blood cultures were drawn during that visit positive for MSSA. She was advised return to ER on 06/08/21, c/o pain all over her body, fatigue, and pleuritic chest pain. Echo showed EF 25-30% (was  55-60% on 11/15/20), LV global hypokinesis, grade II DD, severe dilated LA and RA, moderate pericardial effusion, mild to moderate MR, small 0.44 echodensity seen on the ventricular side of the tricuspid valve. She was continued on GDMT with Coreg, Avapro, torsemide. TEE planned 06/13/21 for further evaluation of tricuspid vegetation.    Assessment & Plan    Newly diagnosed systolic and diastolic heart failure  - presented with pleuritic chest pain, + MSSA bacteremia from recent ER visit  - EF 25-30% (was 55-60% on 11/15/20), LV global hypokinesis, grade II DD, severe dilated LA and RA, moderate pericardial effusion, mild to moderate MR, small 0.44 echodensity seen on the ventricular side of the tricuspid valve. - Etiology unclear at this time for newly depressed EF, considering HTN induced CM, ischemic CM, SLE related myocarditis  - Clinically euvolemic , UOP 876m over the past 24 hours  - please monitor strict I&Os, daily weight, FR <12062mdaily  - continue torsemide 609mID - GDMT: continue Coreg 84m56mD, irbesartan 150mg67mly, and Bidil 20/37.5mg T46m- HD continued for volume management , may stop torsemide if become anuric  - arranged outpatient follow up with cardiology on 07/19/21 for further workup   Pericardial effusion -moderate effusion on intal TTE - TEE 06/13/21 again showed moderate effusion without tamponade   - no clinical evidence of cardiac  tamponade , pleuritic chest pain had resolved  - possible uremic effusion versus SLE related effusion , continue dialysis  - will repeat Echo outpatient in 1 month for re-evaluation   Echodensity of tricuspid valve on TTE 06/08/21  Tricuspid vegetation ruled out on TEE 06/13/21  - initially concerning for bacterial endocarditis in the setting of MSSA bacteremia /self report fever/ HD  - TEE completed on 06/13/21 without evidence of tricuspid vegetation  - Based on [facility] criteria, she does not meet definitve criteria for IE  HTN - BP  much improved,  antihypertensive adjusted to Coreg 21m BID, clonidine 0.286mTID, irbesartan 1503maily, Bidil 20/37.5mg40mD, and torsemide 60mg71m, can continue this regimen and monitor BP   MSSA bacteremia - ID following, TEE negative for SBE, on cefazolin, repeat Bcx 06/08/21 NTD, remained afebrile here, managed per IM    Chronic hypoxic respiratory failure  SLE ESRD GERD Anemia  Anxiety  Asthma  Pain - managed per IM    For questions or updates, please contact [facility] Please consult [email or web address] for contact info under    Signed, Xika Margie Billet 06/14/2021, 7:21 AM    History and all data above reviewed.  Patient examined.  I agree with the findings as above.  She is anxious to go home.  No SOB.  The patient exam reveals COR:RRR  ,  Lungs: Decreased breath sounds bilateral bases  ,  Abd: Positive bowel sounds, no rebound no guarding, Ext No edema  .  All available labs, radiology testing, previous records reviewed. Agree with documented assessment and plan. HTN:  BP still not controlled and I will increase her Avapro.  Follow up BP at dialysis.  CM:  We can follow up with repeat echo in one month to assess this and the effusion.  Volume management per dialysis.  Meds added and titrated.  I emphasized the need to compliance and follow up.  She has many no show appts.  I will see if we can send her home with any of her new meds.   JamesJeneen RinksrApollo Surgery Center42 AM  06/14/2021

## 2021-06-14 NOTE — Progress Notes (Signed)
[facility] for Infectious Disease  Date of Admission:  06/08/2021      Total days of antibiotics 6   Cefazolin    ASSESSMENT: Cathy [name] is a 40 y.o. female with lupus (untreated, current flare), ESRD via HD line and MSSA bacteremia. TTE with concern initially over vegetation however follow up TEE verified no concern for vegetation. There is a newly reduced EF of 25-30% vs echo in Dec-21. She will need follow up with cardiology for this new problem.   For her bacteremia likely related to HD line. She has had a holiday and replaced line already. Would treat with 4 weeks given complicating features of bacteremia and her co-morbidities. D/C home whenever primary team ready. Discussed with Dr. Loleta Books today.    PLAN: Continue Cefazolin through the 9th of August for 4 weeks after HD FU with nephrology and other established care teams     Principal Problem:   MSSA (methicillin susceptible Staphylococcus aureus) septicemia ([place]) Active Problems:   Asthma   MDD (major depressive disorder)   Lupus (systemic lupus erythematosus) (HCC)   Pleuritic chest pain   Diarrhea   ESRD (end stage renal disease) (HCC)   Benign essential HTN   HFrEF (heart failure with reduced ejection fraction) (HCC)   Pleural effusion   Anasarca associated with disorder of kidney    (feeding supplement) PROSource Plus  30 mL Oral TID BM   calcium acetate  1,334 mg Oral TID WC   carvedilol  25 mg Oral BID WC   Chlorhexidine Gluconate Cloth  6 each Topical Q0600   Chlorhexidine Gluconate Cloth  6 each Topical Q0600   Chlorhexidine Gluconate Cloth  6 each Topical Q0600   clobetasol cream   Topical BID   cloNIDine  0.2 mg Oral TID   darbepoetin (ARANESP) injection - DIALYSIS  150 mcg Intravenous Q Thu-HD   doxercalciferol  5 mcg Intravenous Q M,W,F-HD   heparin  5,000 Units Subcutaneous Q8H   [START ON 06/15/2021] irbesartan  300 mg Oral Daily   isosorbide-hydrALAZINE  2 tablet Oral TID    pantoprazole  40 mg Oral Daily   predniSONE  40 mg Oral Q breakfast   torsemide  60 mg Oral BID   triamcinolone   Topical BID    SUBJECTIVE: She is ready to go home. Happy to hear she has no heart infection.    Review of Systems: Review of Systems  Constitutional:  Negative for chills, fever, malaise/fatigue and weight loss.  Respiratory:  Negative for cough and sputum production.   Cardiovascular:  Negative for chest pain and leg swelling.  Gastrointestinal:  Negative for abdominal pain, diarrhea and vomiting.  Genitourinary:  Negative for dysuria and flank pain.  Musculoskeletal:  Positive for joint pain. Negative for myalgias and neck pain.  Skin:  Positive for rash.  Neurological:  Negative for dizziness, tingling and headaches.  Psychiatric/Behavioral:  Negative for depression and substance abuse. The patient is not nervous/anxious and does not have insomnia.   All other systems reviewed and are negative.  No Known Allergies  OBJECTIVE: Vitals:   06/13/21 2323 06/14/21 0020 06/14/21 0054 06/14/21 0900  BP: (!) 130/100 (!) 145/105 (!) 141/107 (!) 172/124  Pulse: 73  68 77  Resp: '20  20 18  '$ Temp:  98.6 F (37 C) 97.9 F (36.6 C) 98.2 F (36.8 C)  TempSrc:  Oral Oral Oral  SpO2: 100%  100% 99%  Weight:  Body mass index is 23.48 kg/m.  Physical Exam Vitals reviewed.  Constitutional:      Comments: Resting in bed  Cardiovascular:     Rate and Rhythm: Normal rate and regular rhythm.  Pulmonary:     Effort: Pulmonary effort is normal.     Breath sounds: Normal breath sounds.  Abdominal:     General: Bowel sounds are normal. There is no distension.  Skin:    General: Skin is warm and dry.     Capillary Refill: Capillary refill takes less than 2 seconds.     Findings: Rash present.  Neurological:     Mental Status: She is oriented to person, place, and time.  New HD Line to chest in place - clean and dry.     Lab Results Lab Results  Component  Value Date   WBC 7.5 06/13/2021   HGB 7.9 (L) 06/13/2021   HCT 25.0 (L) 06/13/2021   MCV 81.7 06/13/2021   PLT 314 06/13/2021    Lab Results  Component Value Date   CREATININE 5.36 (H) 06/13/2021   BUN 48 (H) 06/13/2021   NA 136 06/13/2021   K 3.3 (L) 06/13/2021   CL 100 06/13/2021   CO2 27 06/13/2021    Lab Results  Component Value Date   ALT 17 06/09/2021   AST 37 06/09/2021   ALKPHOS 199 (H) 06/09/2021   BILITOT 0.6 06/09/2021     Microbiology: Recent Results (from the past 240 hour(s))  Culture, blood (Routine X 2) w Reflex to ID Panel     Status: Abnormal   Collection Time: 06/06/21 10:30 AM   Specimen: BLOOD LEFT FOREARM  Result Value Ref Range Status   Specimen Description BLOOD LEFT FOREARM  Final   Special Requests   Final    BOTTLES DRAWN AEROBIC AND ANAEROBIC Blood Culture adequate volume   Culture  Setup Time   Final    GRAM POSITIVE COCCI IN CLUSTERS ANAEROBIC BOTTLE ONLY CRITICAL RESULT CALLED TO, READ BACK BY AND VERIFIED WITH: RN [facility] [id number] FCP Performed at [facility], [place] [address] [place],  [place] [address]    Culture STAPHYLOCOCCUS AUREUS (A)  Final   Report Status 06/09/2021 FINAL  Final   Organism ID, Bacteria STAPHYLOCOCCUS AUREUS  Final      Susceptibility   Staphylococcus aureus - MIC*    CIPROFLOXACIN <=0.5 SENSITIVE Sensitive     ERYTHROMYCIN <=0.25 SENSITIVE Sensitive     GENTAMICIN <=0.5 SENSITIVE Sensitive     OXACILLIN 0.5 SENSITIVE Sensitive     TETRACYCLINE <=1 SENSITIVE Sensitive     VANCOMYCIN 1 SENSITIVE Sensitive     TRIMETH/SULFA <=10 SENSITIVE Sensitive     CLINDAMYCIN <=0.25 SENSITIVE Sensitive     RIFAMPIN <=0.5 SENSITIVE Sensitive     Inducible Clindamycin NEGATIVE Sensitive     * STAPHYLOCOCCUS AUREUS  Blood Culture ID Panel (Reflexed)     Status: Abnormal   Collection Time: 06/06/21 10:30 AM  Result Value Ref Range Status   Enterococcus faecalis NOT DETECTED NOT DETECTED Final   Enterococcus Faecium  NOT DETECTED NOT DETECTED Final   Listeria monocytogenes NOT DETECTED NOT DETECTED Final   Staphylococcus species DETECTED (A) NOT DETECTED Final    Comment: CRITICAL RESULT CALLED TO, READ BACK BY AND VERIFIED WITH: RN [facility] 1111 [id number] FCP    Staphylococcus aureus (BCID) DETECTED (A) NOT DETECTED Final    Comment: CRITICAL RESULT CALLED TO, READ BACK BY AND VERIFIED WITH: RN [facility] [phone] [id number]  FCP    Staphylococcus epidermidis NOT DETECTED NOT DETECTED Final   Staphylococcus lugdunensis NOT DETECTED NOT DETECTED Final   Streptococcus species NOT DETECTED NOT DETECTED Final   Streptococcus agalactiae NOT DETECTED NOT DETECTED Final   Streptococcus pneumoniae NOT DETECTED NOT DETECTED Final   Streptococcus pyogenes NOT DETECTED NOT DETECTED Final   A.calcoaceticus-baumannii NOT DETECTED NOT DETECTED Final   Bacteroides fragilis NOT DETECTED NOT DETECTED Final   Enterobacterales NOT DETECTED NOT DETECTED Final   Enterobacter cloacae complex NOT DETECTED NOT DETECTED Final   Escherichia coli NOT DETECTED NOT DETECTED Final   Klebsiella aerogenes NOT DETECTED NOT DETECTED Final   Klebsiella oxytoca NOT DETECTED NOT DETECTED Final   Klebsiella pneumoniae NOT DETECTED NOT DETECTED Final   Proteus species NOT DETECTED NOT DETECTED Final   Salmonella species NOT DETECTED NOT DETECTED Final   Serratia marcescens NOT DETECTED NOT DETECTED Final   Haemophilus influenzae NOT DETECTED NOT DETECTED Final   Neisseria meningitidis NOT DETECTED NOT DETECTED Final   Pseudomonas aeruginosa NOT DETECTED NOT DETECTED Final   Stenotrophomonas maltophilia NOT DETECTED NOT DETECTED Final   Candida albicans NOT DETECTED NOT DETECTED Final   Candida auris NOT DETECTED NOT DETECTED Final   Candida glabrata NOT DETECTED NOT DETECTED Final   Candida krusei NOT DETECTED NOT DETECTED Final   Candida parapsilosis NOT DETECTED NOT DETECTED Final   Candida tropicalis NOT DETECTED NOT DETECTED Final   Cryptococcus  neoformans/gattii NOT DETECTED NOT DETECTED Final   Meth resistant mecA/C and MREJ NOT DETECTED NOT DETECTED Final    Comment: Performed at [facility], [address] [place], [place] [address]  Blood culture (routine single)     Status: None   Collection Time: 06/06/21 10:35 AM   Specimen: BLOOD  Result Value Ref Range Status   Specimen Description BLOOD RIGHT ANTECUBITAL  Final   Special Requests   Final    BOTTLES DRAWN AEROBIC AND ANAEROBIC Blood Culture results may not be optimal due to an excessive volume of blood received in culture bottles   Culture   Final    NO GROWTH 5 DAYS Performed at [facility], [place] [address], [place], [place] [address]    Report Status 06/11/2021 FINAL  Final  Culture, blood (routine x 2)     Status: None   Collection Time: 06/08/21  4:29 PM   Specimen: Site Not Specified; Blood  Result Value Ref Range Status   Specimen Description SITE NOT SPECIFIED  Final   Special Requests   Final    BOTTLES DRAWN AEROBIC AND ANAEROBIC Blood Culture adequate volume   Culture   Final    NO GROWTH 5 DAYS Performed at [facility], [place] [address], [place], [place] [address]    Report Status 06/13/2021 FINAL  Final  Culture, blood (routine x 2)     Status: None   Collection Time: 06/08/21  5:00 PM   Specimen: BLOOD RIGHT FOREARM  Result Value Ref Range Status   Specimen Description BLOOD RIGHT FOREARM  Final   Special Requests   Final    BOTTLES DRAWN AEROBIC AND ANAEROBIC Blood Culture adequate volume   Culture   Final    NO GROWTH 5 DAYS Performed at [facility], [place] [address] [place], [place] [address]    Report Status 06/13/2021 FINAL  Final  Resp Panel by RT-PCR (Flu A&B, Covid) Nasopharyngeal Swab     Status: None   Collection Time: 06/08/21  5:35 PM   Specimen: Nasopharyngeal Swab; Nasopharyngeal(NP) swabs in vial transport medium  Result Value Ref Range Status   SARS Coronavirus 2 by RT PCR NEGATIVE NEGATIVE Final     Comment: (NOTE) SARS-CoV-2 target nucleic acids are NOT DETECTED.  The SARS-CoV-2 RNA is generally detectable in upper respiratory specimens during the acute phase of infection. The lowest concentration of SARS-CoV-2 viral copies this assay can detect is 138 copies/mL. A negative result does not preclude SARS-Cov-2 infection and should not be used as the sole basis for treatment or other patient management decisions. A negative result may occur with  improper specimen collection/handling, submission of specimen other than nasopharyngeal swab, presence of viral mutation(s) within the areas targeted by this assay, and inadequate number of viral copies(<138 copies/mL). A negative result must be combined with clinical observations, patient history, and epidemiological information. The expected result is Negative.  Fact Sheet for Patients:  EntrepreneurPulse.com.au  Fact Sheet for Healthcare Providers:  IncredibleEmployment.be  This test is no t yet approved or cleared by the [place] FDA and  has been authorized for detection and/or diagnosis of SARS-CoV-2 by FDA under an Emergency Use Authorization (EUA). This EUA will remain  in effect (meaning this test can be used) for the duration of the COVID-19 declaration under Section 564(b)(1) of the Act, 21 U.S.C.section 360bbb-3(b)(1), unless the authorization is terminated  or revoked sooner.       Influenza A by PCR NEGATIVE NEGATIVE Final   Influenza B by PCR NEGATIVE NEGATIVE Final    Comment: (NOTE) The Xpert Xpress SARS-CoV-2/FLU/RSV plus assay is intended as an aid in the diagnosis of influenza from Nasopharyngeal swab specimens and should not be used as a sole basis for treatment. Nasal washings and aspirates are unacceptable for Xpert Xpress SARS-CoV-2/FLU/RSV testing.  Fact Sheet for Patients: EntrepreneurPulse.com.au  Fact Sheet for Healthcare  Providers: IncredibleEmployment.be  This test is not yet approved or cleared by the [place] FDA and has been authorized for detection and/or diagnosis of SARS-CoV-2 by FDA under an Emergency Use Authorization (EUA). This EUA will remain in effect (meaning this test can be used) for the duration of the COVID-19 declaration under Section 564(b)(1) of the Act, 21 U.S.C. section 360bbb-3(b)(1), unless the authorization is terminated or revoked.  Performed at [facility], [place] [address] [place], [place] [address]      Janene Madeira, MSN, NP-C [facility] for Infectious Disease [email or web address] Pager: [phone] Office: [phone] [place]: [phone]

## 2021-06-14 NOTE — Progress Notes (Addendum)
Nsg Discharge Note  Patient received medications from [place] Date:  06/08/2021 Discharge date: 06/14/2021   Cathy [name] to be D/C'd Home per MD order.  AVS completed.  Copy for chart, and copy for patient signed, and dated. Patient/caregiver able to verbalize understanding.  Discharge Medication: Allergies as of 06/14/2021   No Known Allergies      Medication List     STOP taking these medications    amLODipine 10 MG tablet Commonly known as: NORVASC   B-12 1000 MCG Tabs   cloNIDine 0.3 MG tablet Commonly known as: CATAPRES   cyanocobalamin 100 MCG tablet   doxycycline 100 MG capsule Commonly known as: VIBRAMYCIN   folic acid 1 MG tablet Commonly known as: FOLVITE   labetalol 200 MG tablet Commonly known as: NORMODYNE   Mucus Relief 600 MG 12 hr tablet Generic drug: guaiFENesin   olmesartan 40 MG tablet Commonly known as: BENICAR   Vitamin D3 25 MCG tablet Commonly known as: Vitamin D       TAKE these medications    (feeding supplement) PROSource Plus liquid Take 30 mLs by mouth 3 (three) times daily between meals.   acetaminophen 500 MG tablet Commonly known as: TYLENOL Take 1,000 mg by mouth every 6 (six) hours as needed for moderate pain.   albuterol 108 (90 Base) MCG/ACT inhaler Commonly known as: VENTOLIN HFA Inhale 2 puffs into the lungs 2 (two) times daily.   BiDil 20-37.5 MG tablet Generic drug: isosorbide-hydrALAZINE Take 2 tablets by mouth 3 (three) times daily.   carvedilol 25 MG tablet Commonly known as: COREG Take 1 tablet (25 mg total) by mouth 2 (two) times daily with a meal.   ceFAZolin 2-4 GM/100ML-% IVPB Commonly known as: ANCEF Inject 100 mLs (2 g total) into the vein every Tuesday, Thursday, and Saturday at 6 PM for 24 days. Start taking on: June 15, 2021   ipratropium 17 MCG/ACT inhaler Commonly known as: ATROVENT HFA Inhale 2 puffs into the lungs every 6 (six) hours as needed for wheezing.    irbesartan 300 MG tablet Commonly known as: AVAPRO Take 1 tablet (300 mg total) by mouth daily. Start taking on: June 15, 2021   pantoprazole 40 MG tablet Commonly known as: PROTONIX TAKE 1 TABLET (40 MG TOTAL) BY MOUTH DAILY. What changed: how much to take   predniSONE 10 MG tablet Commonly known as: DELTASONE Take 6 tablets (60 mg total) by mouth daily with breakfast for 4 days, THEN 4 tablets (40 mg total) daily with breakfast for 4 days, THEN 2 tablets (20 mg total) daily with breakfast for 4 days, THEN 1 tablet (10 mg total) daily with breakfast for 4 days. Start taking on: June 07, 2021 What changed: Another medication with the same name was removed. Continue taking this medication, and follow the directions you see here.   torsemide 20 MG tablet Commonly known as: DEMADEX TAKE 3 TABLETS (60 MG TOTAL) BY MOUTH 2 (TWO) TIMES DAILY. What changed:  how much to take how to take this when to take this        Discharge Assessment: Vitals:   06/14/21 0054 06/14/21 0900  BP: (!) 141/107 (!) 172/124  Pulse: 68 77  Resp: 20 18  Temp: 97.9 F (36.6 C) 98.2 F (36.8 C)  SpO2: 100% 99%   Skin clean, dry and intact without evidence of skin break down, no evidence of skin tears noted. IV catheter discontinued intact. Site without signs and symptoms  of complications - no redness or edema noted at insertion site, patient denies c/o pain - only slight tenderness at site.  Dressing with slight pressure applied.  D/c Instructions-Education: Discharge instructions given to patient/family with verbalized understanding. D/c education completed with patient/family including follow up instructions, medication list, d/c activities limitations if indicated, with other d/c instructions as indicated by MD - patient able to verbalize understanding, all questions fully answered. Patient instructed to return to ED, call 911, or call MD for any changes in condition.  Patient escorted via [place], and  D/C home via private auto.  Erasmo Leventhal, RN 06/14/2021 12:44 PM

## 2021-06-14 NOTE — Care Management Important Message (Signed)
Important Message  Patient Details  Name: Cathy [name] MRN: [id number] Date of Birth: [birth year]   Medicare Important Message Given:  Yes - Important Message mailed due to current National Emergency   Verbal consent obtained due to current National Emergency  Relationship to patient: Self Contact Name: Joquita Call Date: 06/14/21  Time: 54 Phone: [phone] Outcome: No Answer/Busy Important Message mailed to: Patient address on file    Delorse Lek 06/14/2021, 11:03 AM

## 2021-06-14 NOTE — Progress Notes (Addendum)
Subjective: Seen in room with no complaints, said tolerated dialysis yesterday, noted TEE yesterday "no vegetation"  Objective Vital signs in last 24 hours: Vitals:   06/13/21 2323 06/14/21 0020 06/14/21 0054 06/14/21 0900  BP: (!) 130/100 (!) 145/105 (!) 141/107 (!) 172/124  Pulse: 73  68 77  Resp: '20  20 18  '$ Temp:  98.6 F (37 C) 97.9 F (36.6 C) 98.2 F (36.8 C)  TempSrc:  Oral Oral Oral  SpO2: 100%  100% 99%  Weight:       Weight change:   Physical Exam: General: Thin chronically ill-appearing young female in NAD Heart RRR, 2/6 ejection murmur, no rub Lungs: CTA nonlabored breathing, currently room air Abdomen: Bowel sounds positive normoactive, NTND Extremities: Trace pedal edema  Dialysis Access: Right IJ PermCath  Dialysis Orders: TTS - [facility]  4hrs, BFR 400, DFR 800,  EDW 67kg, 2K/ 2Ca   Mircera 200 mcg q2wks - last 05/28/21 Hectorol 44mg IV qHD - last 05/30/21 Venofer '50mg'$  IV weekly - last 05/28/21 Calcium Acetate '667mg'$  2 tabs TID with meals   Last Labs: Hgb 8.2, TSAT 11 (OP HD 05/07/21), K 4.0, Ca 7.6, Alb 2.2   Problem/Plan: MSSA Bacteremia: Blood Cx 7/7 (+) MSSA sensitive to oxacillin. Blood cultures X 2 were repeated 7/9 with NGTD. ID recommend line holiday and continue cefazolin. TWittmannremoved 7/12 and replaced this 06/13/2021.TEE 7/14 = no vegetations, no SBE . ESRD-HD TTS, IR replaced PermCath 7/14 after line holiday, needs chronic access =pending /post bacteremia treatment in  future Hypertension/volume  - Blood pressure remains elevated. Medications being managed by cardiology.  Reduced 3236 cc UF yesterday with HD ,reiterated the importance of fluid restriction.UF as tolerated.  Needs daily weights weight with dialysis on the right documented 06/11/19 New Onset HFrEF: TEE 7/14 = EF 25-30% ,, global hypokinesis, moderate MR,mild AR , moderate pericardial effusion no tamponade Cardiology following.  Pericardial effusion - noted on TEE, no  tamponade no chest pain .cardiology following Lupus Flare/Systemic Rash: Continue Prednisone Anemia of CKD - Hgb 7.9 on 7/14 and received Aranesp 1563m with HD 7/14  Secondary Hyperparathyroidism - Corr Ca and phos in goal.  Continue VDRA and binders. Nutrition - Albumin low 2.1-added protein supplements. Renal diet w/fluid restrictions.   DaErnest HaberPA-C [facility] [phone]/15/2022,9:42 AM  LOS: 6 days   Labs: Basic Metabolic Panel: Recent Labs  Lab 06/10/21 0158 06/11/21 0208 06/12/21 0327 06/13/21 0957  NA 137 136 136 136  K 4.2 3.3* 4.3 3.3*  CL 101 97* 99 100  CO2 '24 30 27 27  '$ GLUCOSE 131* 106* 116* 100*  BUN 52* 14 29* 48*  CREATININE 9.52* 3.28* 4.65* 5.36*  CALCIUM 7.5* 8.0* 8.3* 7.7*  PHOS 6.0*  --   --  3.5   Liver Function Tests: Recent Labs  Lab 06/08/21 1430 06/09/21 0356 06/10/21 0158 06/13/21 0957  AST 31 37  --   --   ALT 16 17  --   --   ALKPHOS 156* 199*  --   --   BILITOT 0.6 0.6  --   --   PROT 5.8* 5.9*  --   --   ALBUMIN 2.3* 2.2* 2.1* 2.1*   No results for input(s): LIPASE, AMYLASE in the last 168 hours. No results for input(s): AMMONIA in the last 168 hours. CBC: Recent Labs  Lab 06/08/21 1430 06/08/21 1923 06/09/21 0356 06/10/21 0158 06/11/21 0208 06/12/21 0327 06/13/21 0957  WBC 6.7   < >  5.7 4.4 8.1 6.7 7.5  NEUTROABS 4.6  --   --  3.2  --   --   --   HGB 8.1*   < > 8.2* 8.2* 8.4* 8.2* 7.9*  HCT 27.5*   < > 27.0* 26.6* 27.0* 27.6* 25.0*  MCV 84.4   < > 83.1 82.1 83.1 83.4 81.7  PLT 233   < > 219 236 275 300 314   < > = values in this interval not displayed.    Medications:  sodium chloride     sodium chloride     sodium chloride      ceFAZolin (ANCEF) IV 1 g (06/12/21 1640)    (feeding supplement) PROSource Plus  30 mL Oral TID BM   calcium acetate  1,334 mg Oral TID WC   carvedilol  25 mg Oral BID WC   Chlorhexidine Gluconate Cloth  6 each Topical Q0600   Chlorhexidine Gluconate Cloth   6 each Topical Q0600   Chlorhexidine Gluconate Cloth  6 each Topical Q0600   clobetasol cream   Topical BID   cloNIDine  0.2 mg Oral TID   Darbepoetin Alfa       darbepoetin (ARANESP) injection - DIALYSIS  150 mcg Intravenous Q Thu-HD   doxercalciferol  5 mcg Intravenous Q M,W,F-HD   heparin  5,000 Units Subcutaneous Q8H   heparin sodium (porcine)       irbesartan  150 mg Oral Daily   isosorbide-hydrALAZINE  2 tablet Oral TID   pantoprazole  40 mg Oral Daily   predniSONE  40 mg Oral Q breakfast   torsemide  60 mg Oral BID   triamcinolone   Topical BID

## 2021-06-15 ENCOUNTER — Other Ambulatory Visit: Payer: Self-pay

## 2021-06-15 ENCOUNTER — Emergency Department (HOSPITAL_COMMUNITY)
Admission: EM | Admit: 2021-06-15 | Discharge: 2021-06-15 | Disposition: A | Discharge status: Home / Self Care | Payer: Medicare Other | Attending: Emergency Medicine | Admitting: Emergency Medicine

## 2021-06-15 ENCOUNTER — Emergency Department (HOSPITAL_COMMUNITY): Payer: Medicare Other

## 2021-06-15 ENCOUNTER — Encounter (HOSPITAL_COMMUNITY): Payer: Self-pay

## 2021-06-15 DIAGNOSIS — Z992 Dependence on renal dialysis: Secondary | ICD-10-CM | POA: Insufficient documentation

## 2021-06-15 DIAGNOSIS — R0902 Hypoxemia: Secondary | ICD-10-CM | POA: Diagnosis not present

## 2021-06-15 DIAGNOSIS — J45909 Unspecified asthma, uncomplicated: Secondary | ICD-10-CM | POA: Diagnosis not present

## 2021-06-15 DIAGNOSIS — T82838A Hemorrhage of vascular prosthetic devices, implants and grafts, initial encounter: Secondary | ICD-10-CM | POA: Insufficient documentation

## 2021-06-15 DIAGNOSIS — R079 Chest pain, unspecified: Secondary | ICD-10-CM | POA: Diagnosis not present

## 2021-06-15 DIAGNOSIS — Z8616 Personal history of COVID-19: Secondary | ICD-10-CM | POA: Diagnosis not present

## 2021-06-15 DIAGNOSIS — I509 Heart failure, unspecified: Secondary | ICD-10-CM | POA: Insufficient documentation

## 2021-06-15 DIAGNOSIS — R58 Hemorrhage, not elsewhere classified: Secondary | ICD-10-CM | POA: Diagnosis not present

## 2021-06-15 DIAGNOSIS — N186 End stage renal disease: Secondary | ICD-10-CM | POA: Diagnosis not present

## 2021-06-15 DIAGNOSIS — Z87891 Personal history of nicotine dependence: Secondary | ICD-10-CM | POA: Diagnosis not present

## 2021-06-15 DIAGNOSIS — R001 Bradycardia, unspecified: Secondary | ICD-10-CM | POA: Diagnosis not present

## 2021-06-15 DIAGNOSIS — Z7951 Long term (current) use of inhaled steroids: Secondary | ICD-10-CM | POA: Diagnosis not present

## 2021-06-15 DIAGNOSIS — I10 Essential (primary) hypertension: Secondary | ICD-10-CM | POA: Diagnosis not present

## 2021-06-15 DIAGNOSIS — Z79899 Other long term (current) drug therapy: Secondary | ICD-10-CM | POA: Insufficient documentation

## 2021-06-15 DIAGNOSIS — I132 Hypertensive heart and chronic kidney disease with heart failure and with stage 5 chronic kidney disease, or end stage renal disease: Secondary | ICD-10-CM | POA: Insufficient documentation

## 2021-06-15 DIAGNOSIS — Z452 Encounter for adjustment and management of vascular access device: Secondary | ICD-10-CM | POA: Diagnosis not present

## 2021-06-15 LAB — CBC WITH DIFFERENTIAL/PLATELET
Abs Immature Granulocytes: 0.11 10*3/uL — ABNORMAL HIGH (ref 0.00–0.07)
Basophils Absolute: 0 10*3/uL (ref 0.0–0.1)
Basophils Relative: 0 %
Eosinophils Absolute: 0 10*3/uL (ref 0.0–0.5)
Eosinophils Relative: 0 %
HCT: 27 % — ABNORMAL LOW (ref 36.0–46.0)
Hemoglobin: 8.2 g/dL — ABNORMAL LOW (ref 12.0–15.0)
Immature Granulocytes: 1 %
Lymphocytes Relative: 8 %
Lymphs Abs: 0.8 10*3/uL (ref 0.7–4.0)
MCH: 25.5 pg — ABNORMAL LOW (ref 26.0–34.0)
MCHC: 30.4 g/dL (ref 30.0–36.0)
MCV: 84.1 fL (ref 80.0–100.0)
Monocytes Absolute: 0.6 10*3/uL (ref 0.1–1.0)
Monocytes Relative: 5 %
Neutro Abs: 8.9 10*3/uL — ABNORMAL HIGH (ref 1.7–7.7)
Neutrophils Relative %: 86 %
Platelets: 285 10*3/uL (ref 150–400)
RBC: 3.21 MIL/uL — ABNORMAL LOW (ref 3.87–5.11)
RDW: 21.6 % — ABNORMAL HIGH (ref 11.5–15.5)
WBC: 10.4 10*3/uL (ref 4.0–10.5)
nRBC: 0.7 % — ABNORMAL HIGH (ref 0.0–0.2)

## 2021-06-15 LAB — BASIC METABOLIC PANEL
Anion gap: 11 (ref 5–15)
BUN: 33 mg/dL — ABNORMAL HIGH (ref 6–20)
CO2: 25 mmol/L (ref 22–32)
Calcium: 8 mg/dL — ABNORMAL LOW (ref 8.9–10.3)
Chloride: 100 mmol/L (ref 98–111)
Creatinine, Ser: 4.54 mg/dL — ABNORMAL HIGH (ref 0.44–1.00)
GFR, Estimated: 12 mL/min — ABNORMAL LOW (ref >60)
Glucose, Bld: 121 mg/dL — ABNORMAL HIGH (ref 70–99)
Potassium: 3 mmol/L — ABNORMAL LOW (ref 3.5–5.1)
Sodium: 136 mmol/L (ref 135–145)

## 2021-06-15 MED ORDER — LIDOCAINE-EPINEPHRINE (PF) 2 %-1:200000 IJ SOLN
10.0000 mL | Freq: Once | INTRAMUSCULAR | Status: AC
  Administered 2021-06-15: 10 mL
  Filled 2021-06-15: qty 20

## 2021-06-15 NOTE — ED Provider Notes (Signed)
[facility] EMERGENCY DEPARTMENT Provider Note   CSN: [id number] Arrival date & time: 06/15/21  [id number]     History Chief Complaint  Patient presents with   Vascular Access Problem    Cathy [name] is a 40 y.o. female.  She is here for evaluation of right-sided chest pain and bleeding from her dialysis catheter.  She was just hospitalized for fever and bacteremia.  Her dialysis catheter was pulled and no one was placed by interventional radiology.  She thinks she might of slept on it wrong and when she went to dialysis they were unwilling to use her line until it was checked.  Complaining of some soreness around the site.  No shortness of breath vomiting diarrhea.  No recent fevers.  The history is provided by the patient.  Chest Pain Pain location:  R chest Pain quality: aching   Pain radiates to:  Does not radiate Pain severity:  Moderate Onset quality:  Gradual Duration:  2 days Timing:  Constant Progression:  Unchanged Chronicity:  New Relieved by:  Nothing Worsened by:  Certain positions Ineffective treatments:  None tried Associated symptoms: headache   Associated symptoms: no abdominal pain, no cough, no diaphoresis, no fever, no nausea, no shortness of breath and no vomiting   Risk factors: hypertension       Past Medical History:  Diagnosis Date   Anasarca associated with disorder of kidney 06/08/2021   Anxiety    Asthma    Bipolar depression (HCC)    Depression    ESRD (end stage renal disease) on dialysis ([place]) 10/2020   Gonorrhea    Lupus ([place])    Pelvic inflammatory disease (PID)    Pleural effusion 06/08/2021   Renal hypertension    Trichomonas infection     Patient Active Problem List   Diagnosis Date Noted   MSSA (methicillin susceptible Staphylococcus aureus) septicemia ([place]) 06/08/2021   Benign essential HTN 06/08/2021   Pleural effusion 06/08/2021   Anasarca associated with disorder of kidney 06/08/2021   HFrEF (heart failure  with reduced ejection fraction) ([place])    Encounter for antineoplastic chemotherapy 01/07/2021   Respiratory failure ([place]) 12/24/2020   Flash pulmonary edema ([place]) 12/23/2020   Hypertensive emergency 12/23/2020   CHF exacerbation ([place]) 12/23/2020   ESRD (end stage renal disease) ([place]) 12/23/2020   Elevated troponin 12/23/2020   COVID-19 virus infection 12/23/2020   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   Volume overload 11/29/2020   Hyperkalemia 11/29/2020   Pericardial effusion    Aortic atherosclerosis ([place]) 11/07/2020   Anasarca 11/07/2020   Abdominal pain 11/07/2020   Diarrhea 11/07/2020   Hydrosalpinx (bilateral) 02/07/2019   Pelvic adhesive disease 02/03/2019   Tubal ligation evaluation 02/03/2019   Renal hypertension 02/02/2019   Pulmonary edema 02/01/2019   AMA (advanced maternal age) multigravida 35+ 01/12/2019   Non compliance w medication regimen 01/12/2019   Current chronic use of systemic steroids 01/12/2019   Unwanted fertility 12/10/2018   Anemia 10/12/2018   Supervision of high risk pregnancy, antepartum 10/04/2018   Lupus (systemic lupus erythematosus) ([place]) 09/20/2018   Supervision of pregnancy with grand multiparity in second trimester 09/20/2018   Substance abuse ([place]) 09/20/2018   Acute renal failure (ARF) ([place]) 09/20/2018   Pleuritic chest pain 09/20/2018   MDD (major depressive disorder) 10/11/2015   Asthma 09/22/2011    Past Surgical History:  Procedure Laterality Date   IR FLUORO GUIDE CV LINE RIGHT  11/30/2020   IR FLUORO GUIDE CV LINE RIGHT  06/13/2021   IR REMOVAL TUN CV CATH W/O FL  06/11/2021   IR US GUIDE VASC ACCESS RIGHT  11/30/2020   IR US GUIDE VASC ACCESS RIGHT  06/13/2021   RENAL BIOPSY     TEE WITHOUT CARDIOVERSION N/A 06/13/2021   Procedure: TRANSESOPHAGEAL ECHOCARDIOGRAM (TEE);  Surgeon: Josue Hector, MD;  Location: [facility];  Service: Cardiovascular;  Laterality: N/A;   TUBAL LIGATION N/A 02/03/2019   Procedure: POST PARTUM TUBAL  LIGATION;  Surgeon: Aletha Halim, MD;  Location: [facility] LD ORS;  Service: Gynecology;  Laterality: N/A;     OB History     Gravida  6   Para  5   Term  4   Preterm  1   AB  1   Living  5      SAB  1   IAB  0   Ectopic  0   Multiple  0   Live Births  5           Family History  Problem Relation Age of Onset   Diabetes Maternal Grandmother    Hypertension Maternal Grandmother    Diabetes Maternal Grandfather    Hypertension Maternal Grandfather    Diabetes Paternal Grandmother    Hypertension Paternal Grandmother    Diabetes Paternal Grandfather    Hypertension Paternal Grandfather     Social History   Tobacco Use   Smoking status: Former    Packs/day: 0.25    Types: Cigarettes    Quit date: 10/08/2020    Years since quitting: 0.6   Smokeless tobacco: Never  Vaping Use   Vaping Use: Never used  Substance Use Topics   Alcohol use: Not Currently   Drug use: Not Currently    Types: Marijuana    Comment: 3 times a week     Home Medications Prior to Admission medications   Medication Sig Start Date End Date Taking? Authorizing Provider  acetaminophen (TYLENOL) 500 MG tablet Take 1,000 mg by mouth every 6 (six) hours as needed for moderate pain.    [provider]  albuterol (VENTOLIN HFA) 108 (90 Base) MCG/ACT inhaler Inhale 2 puffs into the lungs 2 (two) times daily. 06/14/21 06/14/22  Danford, Suann Larry, MD  carvedilol (COREG) 25 MG tablet Take 1 tablet (25 mg total) by mouth 2 (two) times daily with a meal. 06/14/21   Danford, Suann Larry, MD  ceFAZolin (ANCEF) 2-4 GM/100ML-% IVPB Inject 100 mLs (2 g total) into the vein every Tuesday, Thursday, and Saturday at 6 PM for 24 days. 06/15/21 07/09/21  Danford, Suann Larry, MD  ipratropium (ATROVENT HFA) 17 MCG/ACT inhaler Inhale 2 puffs into the lungs every 6 (six) hours as needed for wheezing. 06/14/21 06/14/22  Danford, Suann Larry, MD  irbesartan (AVAPRO) 300 MG tablet Take 1 tablet (300 mg  total) by mouth daily. 06/15/21   Danford, Suann Larry, MD  isosorbide-hydrALAZINE (BIDIL) 20-37.5 MG tablet Take 2 tablets by mouth 3 (three) times daily. 06/14/21   Danford, Suann Larry, MD  Nutritional Supplements (,FEEDING SUPPLEMENT, PROSOURCE PLUS) liquid Take 30 mLs by mouth 3 (three) times daily between meals. 06/14/21   Danford, Suann Larry, MD  pantoprazole (PROTONIX) 40 MG tablet TAKE 1 TABLET (40 MG TOTAL) BY MOUTH DAILY. 12/31/20 12/31/21  Regalado, Belkys A, MD  predniSONE (DELTASONE) 10 MG tablet Take 6 tablets (60 mg total) by mouth daily with breakfast for 4 days, THEN 4 tablets (40 mg total) daily with breakfast for 4 days, THEN 2 tablets (  20 mg total) daily with breakfast for 4 days, THEN 1 tablet (10 mg total) daily with breakfast for 4 days. 06/07/21 06/23/21  Wyvonnia Dusky, MD  torsemide (DEMADEX) 20 MG tablet TAKE 3 TABLETS (60 MG TOTAL) BY MOUTH 2 (TWO) TIMES DAILY. 12/31/20 12/31/21  Regalado, Jerald Kief A, MD  fenofibrate 160 MG tablet Take 1 tablet (160 mg total) by mouth daily. 12/31/20 01/20/21  Regalado, Cassie Freer, MD    Allergies    Patient has no known allergies.  Review of Systems   Review of Systems  Constitutional:  Negative for diaphoresis and fever.  HENT:  Negative for sore throat.   Eyes:  Negative for visual disturbance.  Respiratory:  Negative for cough and shortness of breath.   Cardiovascular:  Positive for chest pain.  Gastrointestinal:  Negative for abdominal pain, nausea and vomiting.  Genitourinary:  Negative for dysuria.  Musculoskeletal:  Negative for neck pain.  Skin:  Positive for wound.  Neurological:  Positive for headaches.   Physical Exam Updated Vital Signs BP (!) 154/115   Pulse 86   Temp 98 F (36.7 C) (Oral)   Resp 17   Ht '5\' 7"'$  (1.702 m)   Wt 65.8 kg   SpO2 100%   BMI 22.71 kg/m   Physical Exam Vitals and nursing note reviewed.  Constitutional:      General: She is not in acute distress.    Appearance: Normal appearance.  She is well-developed.  HENT:     Head: Normocephalic and atraumatic.  Eyes:     Conjunctiva/sclera: Conjunctivae normal.  Cardiovascular:     Rate and Rhythm: Normal rate and regular rhythm.     Heart sounds: No murmur heard. Pulmonary:     Effort: Pulmonary effort is normal. No respiratory distress.     Breath sounds: Normal breath sounds.  Chest:    Abdominal:     Palpations: Abdomen is soft.     Tenderness: There is no abdominal tenderness.  Musculoskeletal:        General: No deformity or signs of injury. Normal range of motion.     Cervical back: Neck supple.  Skin:    General: Skin is warm and dry.  Neurological:     General: No focal deficit present.     Mental Status: She is alert.     ED Results / Procedures / Treatments   Labs (all labs ordered are listed, but only abnormal results are displayed) Labs Reviewed  BASIC METABOLIC PANEL - Abnormal; Notable for the following components:      Result Value   Potassium 3.0 (*)    Glucose, Bld 121 (*)    BUN 33 (*)    Creatinine, Ser 4.54 (*)    Calcium 8.0 (*)    GFR, Estimated 12 (*)    All other components within normal limits  CBC WITH DIFFERENTIAL/PLATELET - Abnormal; Notable for the following components:   RBC 3.21 (*)    Hemoglobin 8.2 (*)    HCT 27.0 (*)    MCH 25.5 (*)    RDW 21.6 (*)    nRBC 0.7 (*)    Neutro Abs 8.9 (*)    Abs Immature Granulocytes 0.11 (*)    All other components within normal limits    EKG None  Radiology DG Chest Port 1 View  Result Date: 06/15/2021 CLINICAL DATA:  RIGHT chest pain.  Evaluate catheter. EXAM: PORTABLE CHEST 1 VIEW COMPARISON:  06/11/2021 FINDINGS: Interval placement of RIGHT IJ central line,  tip overlying the superior vena cava. There is no pneumothorax. Heart size is normal accounting for AP position. Lungs are clear. IMPRESSION: No evidence for acute cardiopulmonary abnormality. Interval placement of RIGHT-sided internal jugular catheter, unremarkable in  appearance. Consider LATERAL view if needed to confirm catheter placement. Electronically Signed   By: Nolon Nations M.D.   On: 06/15/2021 10:17    Procedures Procedures   Medications Ordered in ED Medications - No data to display  ED Course  I have reviewed the triage vital signs and the nursing notes.  Pertinent labs & imaging results that were available during my care of the patient were reviewed by me and considered in my medical decision making (see chart for details).  Clinical Course as of 06/15/21 1725  Sat Jun 15, 2021  0959 Reviewed with Dr. Vernard Gambles interventional radiology.  He said if the catheter seems intact and she still bleeding can put a pursestring around the skin entry site.  Otherwise just a new Biopatch and dressing. [MB]  1111 I was able to remove the clot after prepping the area with ChloraPrep's.  I tried to reinforce with a pursestring of the patient's catheter skin interface.  A new bio and dressing was placed. [MB]  1225 Her catheter was reexamined at 1230 and there was no signs of any active bleeding.  She is comfortable plan for discharge. [MB]    Clinical Course User Index [MB] Hayden Rasmussen, MD   MDM Rules/Calculators/A&P                           Final Clinical Impression(s) / ED Diagnoses Final diagnoses:  Bleeding due to dialysis catheter placement, initial encounter [facility])    Rx / DC Orders ED Discharge Orders     None        Hayden Rasmussen, MD 06/15/21 1726

## 2021-06-15 NOTE — Discharge Instructions (Addendum)
You are seen in the emergency department for bleeding at your dialysis catheter site.  The area was cleaned and stitched in the seem to help.  Please follow up with your dialysis center.  Return to the emergency department if any worsening or concerning symptoms

## 2021-06-15 NOTE — ED Notes (Signed)
Sterile dressing applied to dialysis port by this RN.

## 2021-06-15 NOTE — ED Triage Notes (Signed)
Pt from home BIB EMS from dialysis for c/o blood and pain around dialysis site. Pt recently admitted and d/c from here; dialysis port replaced. No redness or swelling at site. Denies NVD or any other complaints.

## 2021-06-15 NOTE — ED Notes (Signed)
Dialysis catheter dressing changed using sterile technique. Fibrin sheath noted to backside of catheter.

## 2021-06-15 NOTE — ED Notes (Signed)
Help get patient on the monitor into a gown patient is resting with call bell in reach °

## 2021-06-15 NOTE — ED Notes (Signed)
Pt verbalizes understanding of discharge instructions. Opportunity for questions and answers were provided. Pt discharged from the ED.   ?

## 2021-06-16 ENCOUNTER — Telehealth: Payer: Self-pay | Admitting: Nephrology

## 2021-06-16 NOTE — Telephone Encounter (Signed)
Transition of care contact from inpatient facility  Date of discharge: 06/14/21 Date of contact: 06/16/21 Method: Phone Spoke to: Patient  Patient contacted to discuss transition of care from recent inpatient hospitalization. Patient was admitted to [facility] from .06/08/21-06/14/21.Marland Kitchen with discharge diagnosis of MSSA Bacteremia/Lupus Flare/systemic rash/ New combined Systolic/diastolic HF / Pericardia; effusion    Medication changes were reviewed.  Patient will follow up with his/her outpatient HD unit on: 06/18/21 thinks she will be okay to miss hd tomor  ,missed sat sec bleeding permcath dressing

## 2021-06-18 DIAGNOSIS — N2581 Secondary hyperparathyroidism of renal origin: Secondary | ICD-10-CM | POA: Diagnosis not present

## 2021-06-18 DIAGNOSIS — Z992 Dependence on renal dialysis: Secondary | ICD-10-CM | POA: Diagnosis not present

## 2021-06-18 DIAGNOSIS — R7881 Bacteremia: Secondary | ICD-10-CM | POA: Diagnosis not present

## 2021-06-18 DIAGNOSIS — N186 End stage renal disease: Secondary | ICD-10-CM | POA: Diagnosis not present

## 2021-06-18 DIAGNOSIS — D649 Anemia, unspecified: Secondary | ICD-10-CM | POA: Diagnosis not present

## 2021-06-22 ENCOUNTER — Encounter (HOSPITAL_COMMUNITY): Payer: Self-pay | Admitting: Vascular Surgery

## 2021-06-22 DIAGNOSIS — R7881 Bacteremia: Secondary | ICD-10-CM | POA: Diagnosis not present

## 2021-06-22 DIAGNOSIS — N186 End stage renal disease: Secondary | ICD-10-CM | POA: Diagnosis not present

## 2021-06-22 DIAGNOSIS — N2581 Secondary hyperparathyroidism of renal origin: Secondary | ICD-10-CM | POA: Diagnosis not present

## 2021-06-22 DIAGNOSIS — D649 Anemia, unspecified: Secondary | ICD-10-CM | POA: Diagnosis not present

## 2021-06-22 DIAGNOSIS — Z992 Dependence on renal dialysis: Secondary | ICD-10-CM | POA: Diagnosis not present

## 2021-06-23 ENCOUNTER — Encounter (HOSPITAL_COMMUNITY): Payer: Self-pay | Admitting: Anesthesiology

## 2021-06-23 NOTE — Anesthesia Preprocedure Evaluation (Deleted)
Anesthesia Evaluation    Reviewed: Allergy & Precautions, Patient's Chart, lab work & pertinent test results  Airway        Dental   Pulmonary asthma , former smoker,           Cardiovascular hypertension, Pt. on home beta blockers and Pt. on medications +CHF    ECHO: 1. No evidence of SBE/Vegetations. 2. Left ventricular ejection fraction, by estimation, is 25 to 30%. The left ventricle has severely decreased function. The left ventricle demonstrates global hypokinesis. The left ventricular internal cavity size was moderately dilated. Left ventricular diastolic function could not be evaluated. 3. Right ventricular systolic function is moderately reduced. The right ventricular size is moderately enlarged. 4. Left atrial size was moderately dilated. No left atrial/left atrial appendage thrombus was detected. 5. Right atrial size was New dialysis catheter tip noted on RA. 6. Moderate effuson no evidence tamponade . Moderate pericardial effusion. 7. The mitral valve is normal in structure. Mild mitral valve regurgitation. 8. I reviewed patients previous TTE and did not see evidence of a vegetation on that study either . Tricuspid valve regurgitation is moderate. 9. The aortic valve is tricuspid. Aortic valve regurgitation is mild. Mild aortic valve sclerosis is present, with no evidence of aortic valve stenosis.   Neuro/Psych PSYCHIATRIC DISORDERS Anxiety Depression Bipolar Disorder negative neurological ROS     GI/Hepatic GERD  Medicated,(+)     substance abuse  ,   Endo/Other  negative endocrine ROS  Renal/GU ESRF and DialysisRenal disease     Musculoskeletal negative musculoskeletal ROS (+)   Abdominal   Peds  Hematology  (+) anemia ,   Anesthesia Other Findings ESRD  Reproductive/Obstetrics                             Anesthesia Physical Anesthesia Plan  ASA: 4  Anesthesia Plan:     Post-op Pain Management:    Induction:   PONV Risk Score and Plan:   Airway Management Planned:   Additional Equipment:   Intra-op Plan:   Post-operative Plan:   Informed Consent:   Plan Discussed with:   Anesthesia Plan Comments:         Anesthesia Quick Evaluation

## 2021-06-24 ENCOUNTER — Ambulatory Visit (HOSPITAL_COMMUNITY): Admission: RE | Admit: 2021-06-24 | Payer: Medicare Other | Source: Home / Self Care | Admitting: Vascular Surgery

## 2021-06-24 HISTORY — DX: Dyspnea, unspecified: R06.00

## 2021-06-24 HISTORY — DX: Gastro-esophageal reflux disease without esophagitis: K21.9

## 2021-06-24 SURGERY — ARTERIOVENOUS (AV) FISTULA CREATION
Anesthesia: Choice | Laterality: Left

## 2021-06-24 NOTE — Progress Notes (Addendum)
Patient called and stated that she will not be coming in for surgery today due to feeling sick on her stomach.  Vincent in [place] notified.  Patient stated she will call Dr. Nicole Cella office to reschedule this morning.    Update at 0602: Dr. Scot Dock paged - waiting a return call.   Update at 0613: Dr. Scot Dock is aware.

## 2021-06-25 DIAGNOSIS — Z992 Dependence on renal dialysis: Secondary | ICD-10-CM | POA: Diagnosis not present

## 2021-06-25 DIAGNOSIS — D649 Anemia, unspecified: Secondary | ICD-10-CM | POA: Diagnosis not present

## 2021-06-25 DIAGNOSIS — N2581 Secondary hyperparathyroidism of renal origin: Secondary | ICD-10-CM | POA: Diagnosis not present

## 2021-06-25 DIAGNOSIS — R7881 Bacteremia: Secondary | ICD-10-CM | POA: Diagnosis not present

## 2021-06-25 DIAGNOSIS — N186 End stage renal disease: Secondary | ICD-10-CM | POA: Diagnosis not present

## 2021-06-28 ENCOUNTER — Telehealth: Payer: Self-pay

## 2021-06-28 NOTE — Telephone Encounter (Signed)
Pt called office requesting to cancel left AVF/AVG surgery on 07/01/21 with Dr Scot Dock d/t lack of transportation. Offered pt [facility] transportation assistance but declined. Stated she will call back to reschedule when family is available.

## 2021-06-29 DIAGNOSIS — Z992 Dependence on renal dialysis: Secondary | ICD-10-CM | POA: Diagnosis not present

## 2021-06-29 DIAGNOSIS — N2581 Secondary hyperparathyroidism of renal origin: Secondary | ICD-10-CM | POA: Diagnosis not present

## 2021-06-29 DIAGNOSIS — D649 Anemia, unspecified: Secondary | ICD-10-CM | POA: Diagnosis not present

## 2021-06-29 DIAGNOSIS — N186 End stage renal disease: Secondary | ICD-10-CM | POA: Diagnosis not present

## 2021-06-29 DIAGNOSIS — R7881 Bacteremia: Secondary | ICD-10-CM | POA: Diagnosis not present

## 2021-07-01 ENCOUNTER — Ambulatory Visit (HOSPITAL_COMMUNITY): Admission: RE | Admit: 2021-07-01 | Payer: Medicare Other | Source: Home / Self Care | Admitting: Vascular Surgery

## 2021-07-01 ENCOUNTER — Encounter (HOSPITAL_COMMUNITY): Admission: RE | Payer: Self-pay | Source: Home / Self Care

## 2021-07-01 SURGERY — ARTERIOVENOUS (AV) FISTULA CREATION
Anesthesia: Choice | Laterality: Left

## 2021-07-18 NOTE — Progress Notes (Deleted)
Cardiology Office Note   Date:  07/18/2021   ID:  Cathy [name], DOB 09-02-1981, MRN [id number]  PCP:  Pcp, No  Cardiologist: Dr. Percival Spanish No chief complaint on file.    History of Present Illness: Cathy [name] is a 40 y.o. female who presents for for ongoing assessment and management of hypertension, with other history of ESRD on hemodialysis Tuesdays Thursdays and Saturdays, lupus, anxiety, asthma.  She was admitted in July 2022 in the setting of lupus flare and fever and was found to be positive for MSSA.  Echocardiogram on 06/08/2021 revealed an EF of 25% to 30% with LV global hypokinesis grade 2 diastolic dysfunction severe dilated LA and RA with moderate pericardial effusion and mild to moderate MR.  A small 0.44 echodensity was seen on ventricular side of the tricuspid valve.   TEE was performed on 06/13/2021 which was negative for SBE or vegetations.  EF of 25% to 30% with global hypokinesis with moderate pericardial effusion without tamponade.  She was continued on hemodialysis and antibiotics, and also diagnosed with SLE related myocarditis.  Dialysis was to manage volume status.  She is to have a follow-up echocardiogram after this visit to evaluate her status.  She does have a history of noncompliance concerning follow-up appointments.    Past Medical History:  Diagnosis Date   Anasarca associated with disorder of kidney 06/08/2021   Anxiety    Asthma    Bipolar depression ([place])    Depression    Dyspnea    ESRD (end stage renal disease) on dialysis ([place]) 10/2020   GERD (gastroesophageal reflux disease)    Gonorrhea    Lupus (HCC)    Pelvic inflammatory disease (PID)    Pleural effusion 06/08/2021   Renal hypertension    Trichomonas infection     Past Surgical History:  Procedure Laterality Date   IR FLUORO GUIDE CV LINE RIGHT  11/30/2020   IR FLUORO GUIDE CV LINE RIGHT  06/13/2021   IR REMOVAL TUN CV CATH W/O FL  06/11/2021   IR US GUIDE VASC ACCESS RIGHT   11/30/2020   IR US GUIDE VASC ACCESS RIGHT  06/13/2021   RENAL BIOPSY     TEE WITHOUT CARDIOVERSION N/A 06/13/2021   Procedure: TRANSESOPHAGEAL ECHOCARDIOGRAM (TEE);  Surgeon: Josue Hector, MD;  Location: [facility];  Service: Cardiovascular;  Laterality: N/A;   TUBAL LIGATION N/A 02/03/2019   Procedure: POST PARTUM TUBAL LIGATION;  Surgeon: Aletha Halim, MD;  Location: [facility];  Service: Gynecology;  Laterality: N/A;     Current Outpatient Medications  Medication Sig Dispense Refill   acetaminophen (TYLENOL) 500 MG tablet Take 1,000 mg by mouth every 6 (six) hours as needed for moderate pain.     albuterol (VENTOLIN HFA) 108 (90 Base) MCG/ACT inhaler Inhale 2 puffs into the lungs 2 (two) times daily.     carvedilol (COREG) 25 MG tablet Take 1 tablet (25 mg total) by mouth 2 (two) times daily with a meal. 60 tablet 3   ipratropium (ATROVENT HFA) 17 MCG/ACT inhaler Inhale 2 puffs into the lungs every 6 (six) hours as needed for wheezing.     irbesartan (AVAPRO) 300 MG tablet Take 1 tablet (300 mg total) by mouth daily. 30 tablet 3   isosorbide-hydrALAZINE (BIDIL) 20-37.5 MG tablet Take 2 tablets by mouth 3 (three) times daily. 180 tablet 3   Nutritional Supplements (,FEEDING SUPPLEMENT, PROSOURCE PLUS) liquid Take 30 mLs by mouth 3 (three) times daily between meals. 887 mL  2   pantoprazole (PROTONIX) 40 MG tablet TAKE 1 TABLET (40 MG TOTAL) BY MOUTH DAILY. 30 tablet 1   torsemide (DEMADEX) 20 MG tablet TAKE 3 TABLETS (60 MG TOTAL) BY MOUTH 2 (TWO) TIMES DAILY. 180 tablet 2   No current facility-administered medications for this visit.    Allergies:   Patient has no known allergies.    Social History:  The patient  reports that she quit smoking about 9 months ago. Her smoking use included cigarettes. She smoked an average of .25 packs per day. She has never used smokeless tobacco. She reports that she does not currently use alcohol. She reports that she does not currently use drugs  after having used the following drugs: Marijuana.   Family History:  The patient's family history includes Diabetes in her maternal grandfather, maternal grandmother, paternal grandfather, and paternal grandmother; Hypertension in her maternal grandfather, maternal grandmother, paternal grandfather, and paternal grandmother.    ROS: All other systems are reviewed and negative. Unless otherwise mentioned in H&P    PHYSICAL EXAM: VS:  There were no vitals taken for this visit. , BMI There is no height or weight on file to calculate BMI. GEN: Well nourished, well developed, in no acute distress HEENT: normal Neck: no JVD, carotid bruits, or masses Cardiac: ***RRR; no murmurs, rubs, or gallops,no edema  Respiratory:  Clear to auscultation bilaterally, normal work of breathing GI: soft, nontender, nondistended, + BS MS: no deformity or atrophy Skin: warm and dry, no rash Neuro:  Strength and sensation are intact Psych: euthymic mood, full affect   EKG:  EKG {ACTION; IS/IS [id number] ordered today. The ekg ordered today demonstrates ***   Recent Labs: 11/07/2020: TSH 2.355 01/18/2021: B Natriuretic Peptide >4,500.0 06/09/2021: ALT 17; Magnesium 1.7 06/15/2021: BUN 33; Creatinine, Ser 4.54; Hemoglobin 8.2; Platelets 285; Potassium 3.0; Sodium 136    Lipid Panel    Component Value Date/Time   CHOL 299 (H) 11/12/2020 0526   TRIG 568 (H) 11/12/2020 0526   HDL 23 (L) 11/12/2020 0526   CHOLHDL 13.0 11/12/2020 0526   VLDL UNABLE TO CALCULATE IF TRIGLYCERIDE OVER 400 mg/dL 11/12/2020 0526   LDLCALC UNABLE TO CALCULATE IF TRIGLYCERIDE OVER 400 mg/dL 11/12/2020 0526   LDLDIRECT 154.7 (H) 11/12/2020 0526      Wt Readings from Last 3 Encounters:  06/15/21 145 lb (65.8 kg)  06/10/21 149 lb 14.6 oz (68 kg)  06/06/21 147 lb 0.8 oz (66.7 kg)      Other studies Reviewed: Echocardiogram 06-22-2021 1. Left ventricular ejection fraction, by estimation, is 25 to 30%. The  left ventricle has  severely decreased function. The left ventricle  demonstrates global hypokinesis. There is mild concentric left ventricular  hypertrophy. Left ventricular diastolic   parameters are consistent with Grade II diastolic dysfunction  (pseudonormalization).   2. Right ventricular systolic function is normal. The right ventricular  size is normal. Mildly increased right ventricular wall thickness. There  is normal pulmonary artery systolic pressure.   3. Left atrial size was severely dilated.   4. Right atrial size was severely dilated.   5. Moderate pericardial effusion. There is no evidence of cardiac  tamponade.   6. The mitral valve is grossly normal. Mild to moderate mitral valve  regurgitation.   7. The tricuspid valve is abnormal- small 0.44 echodensity seen on the  ventricular side of the tricuspid valve (Image 19/85. Tricuspid valve  regurgitation is mild to moderate.   8. The aortic valve is tricuspid. Aortic valve  regurgitation is mild. No  aortic stenosis is present.   TEE 06/13/2021  1. No evidence of SBE/Vegetations.   2. Left ventricular ejection fraction, by estimation, is 25 to 30%. The  left ventricle has severely decreased function. The left ventricle  demonstrates global hypokinesis. The left ventricular internal cavity size  was moderately dilated. Left  ventricular diastolic function could not be evaluated.   3. Right ventricular systolic function is moderately reduced. The right  ventricular size is moderately enlarged.   4. Left atrial size was moderately dilated. No left atrial/left atrial  appendage thrombus was detected.   5. Right atrial size was New dialysis catheter tip noted on RA.   6. Moderate effuson no evidence tamponade . Moderate pericardial  effusion.   7. The mitral valve is normal in structure. Mild mitral valve  regurgitation.   8. I reviewed patients previous TTE and did not see evidence of a  vegetation on that study either . Tricuspid valve  regurgitation is  moderate.   9. The aortic valve is tricuspid. Aortic valve regurgitation is mild.  Mild aortic valve sclerosis is present, with no evidence of aortic valve  stenosis.   ASSESSMENT AND PLAN:  1.  ***   Current medicines are reviewed at length with the patient today.  I have spent *** dedicated to the care of this patient on the date of this encounter to include pre-visit review of records, assessment, management and diagnostic testing,with shared decision making.  Labs/ tests ordered today include: *** Phill Myron. West Pugh, ANP, AACC   07/18/2021 12:37 PM    [facility] [place] [address] Office [phone] Fax [phone]  Notice: This dictation was prepared with Dragon dictation along with smaller phrase technology. Any transcriptional errors that result from this process are unintentional and may not be corrected upon review.

## 2021-07-19 ENCOUNTER — Ambulatory Visit: Payer: Medicare Other | Admitting: Adult Health

## 2021-07-19 NOTE — Telephone Encounter (Signed)
Request received from [facility] requesting to reschedule surgery. Spoke with Britney who informed willing to rearrange dialysis days to get pt scheduled sooner. Pt scheduled for 07/30/21 and instructions provided. Britney advised she will inform pt of this information at dialysis treatment.

## 2021-07-23 DIAGNOSIS — Z7689 Persons encountering health services in other specified circumstances: Secondary | ICD-10-CM | POA: Diagnosis not present

## 2021-07-25 DIAGNOSIS — Z7689 Persons encountering health services in other specified circumstances: Secondary | ICD-10-CM | POA: Diagnosis not present

## 2021-07-29 ENCOUNTER — Telehealth: Payer: Self-pay

## 2021-07-29 NOTE — Telephone Encounter (Signed)
Returned [name]'s call regarding her being unable to contact pt. Pt is scheduled for surgery tomorrow. Surgery scheduler was unable to reach pt and scheduled surgery with Cathy [name]. I have let Helene Kelp know this.

## 2021-07-29 NOTE — Progress Notes (Signed)
I have attempted many calls to pt for pre-op call and left a message on her mother's number. Pt's phone just rings, no voicemail. Mother never returned my call. I called and spoke with Cathy [name] at [facility] to let her know that I couldn't reach pt and wanted to know if they had any other number. They did not but she did give me the number to the dialysis center that pt goes to. I called and spoke with Cathy [name] Cathy [name] had said she thought Cathy [name] had given Cathy [name] instructions for the patient) Cathy [name] states she didn't have any instructions but had Cathy [name] speak with me. Cathy [name] states pt has not been to dialysis since August 4th. She states they called for a welfare check on pt and was told pt was home and told the person that checked on her that she "would see them" in a few days. They still have not seen her. I called [facility] back and spoke with Cathy [name] had left for the day) and I told her what the dialysis center had told me and that I couldn't reach pt. She states she will notify Dr. Scot Dock but didn't want to cancel pt because she "might come for surgery".

## 2021-07-30 ENCOUNTER — Encounter (HOSPITAL_COMMUNITY): Admission: RE | Payer: Self-pay | Source: Home / Self Care

## 2021-07-30 ENCOUNTER — Encounter (HOSPITAL_COMMUNITY): Payer: Self-pay | Admitting: Anesthesiology

## 2021-07-30 ENCOUNTER — Ambulatory Visit (HOSPITAL_COMMUNITY): Admission: RE | Admit: 2021-07-30 | Payer: Medicare Other | Source: Home / Self Care | Admitting: Vascular Surgery

## 2021-07-30 SURGERY — ARTERIOVENOUS (AV) FISTULA CREATION
Anesthesia: Choice | Laterality: Left

## 2021-07-30 MED ORDER — PROTAMINE SULFATE 10 MG/ML IV SOLN
INTRAVENOUS | Status: AC
  Filled 2021-07-30: qty 5

## 2021-07-30 MED ORDER — PROPOFOL 10 MG/ML IV BOLUS
INTRAVENOUS | Status: AC
  Filled 2021-07-30: qty 20

## 2021-07-30 MED ORDER — MIDAZOLAM HCL 2 MG/2ML IJ SOLN
INTRAMUSCULAR | Status: AC
  Filled 2021-07-30: qty 2

## 2021-07-30 MED ORDER — FENTANYL CITRATE (PF) 250 MCG/5ML IJ SOLN
INTRAMUSCULAR | Status: AC
  Filled 2021-07-30: qty 5

## 2021-08-01 DIAGNOSIS — Z7689 Persons encountering health services in other specified circumstances: Secondary | ICD-10-CM | POA: Diagnosis not present

## 2021-08-01 DIAGNOSIS — N186 End stage renal disease: Secondary | ICD-10-CM | POA: Diagnosis not present

## 2021-08-01 DIAGNOSIS — Z23 Encounter for immunization: Secondary | ICD-10-CM | POA: Diagnosis not present

## 2021-08-01 DIAGNOSIS — M3214 Glomerular disease in systemic lupus erythematosus: Secondary | ICD-10-CM | POA: Diagnosis not present

## 2021-08-01 DIAGNOSIS — N2581 Secondary hyperparathyroidism of renal origin: Secondary | ICD-10-CM | POA: Diagnosis not present

## 2021-08-01 DIAGNOSIS — T8249XD Other complication of vascular dialysis catheter, subsequent encounter: Secondary | ICD-10-CM | POA: Diagnosis not present

## 2021-08-01 DIAGNOSIS — Z992 Dependence on renal dialysis: Secondary | ICD-10-CM | POA: Diagnosis not present

## 2021-08-01 DIAGNOSIS — D509 Iron deficiency anemia, unspecified: Secondary | ICD-10-CM | POA: Diagnosis not present

## 2021-08-01 DIAGNOSIS — D689 Coagulation defect, unspecified: Secondary | ICD-10-CM | POA: Diagnosis not present

## 2021-08-01 DIAGNOSIS — E875 Hyperkalemia: Secondary | ICD-10-CM | POA: Diagnosis not present

## 2021-08-01 DIAGNOSIS — D649 Anemia, unspecified: Secondary | ICD-10-CM | POA: Diagnosis not present

## 2021-08-01 DIAGNOSIS — E876 Hypokalemia: Secondary | ICD-10-CM | POA: Diagnosis not present

## 2021-08-01 DIAGNOSIS — Z9119 Patient's noncompliance with other medical treatment and regimen: Secondary | ICD-10-CM | POA: Diagnosis not present

## 2021-08-08 DIAGNOSIS — E875 Hyperkalemia: Secondary | ICD-10-CM | POA: Diagnosis not present

## 2021-08-08 DIAGNOSIS — Z9119 Patient's noncompliance with other medical treatment and regimen: Secondary | ICD-10-CM | POA: Diagnosis not present

## 2021-08-08 DIAGNOSIS — Z7689 Persons encountering health services in other specified circumstances: Secondary | ICD-10-CM | POA: Diagnosis not present

## 2021-08-08 DIAGNOSIS — D689 Coagulation defect, unspecified: Secondary | ICD-10-CM | POA: Diagnosis not present

## 2021-08-08 DIAGNOSIS — E876 Hypokalemia: Secondary | ICD-10-CM | POA: Diagnosis not present

## 2021-08-08 DIAGNOSIS — D649 Anemia, unspecified: Secondary | ICD-10-CM | POA: Diagnosis not present

## 2021-08-08 DIAGNOSIS — Z23 Encounter for immunization: Secondary | ICD-10-CM | POA: Diagnosis not present

## 2021-08-08 DIAGNOSIS — D509 Iron deficiency anemia, unspecified: Secondary | ICD-10-CM | POA: Diagnosis not present

## 2021-08-08 DIAGNOSIS — N186 End stage renal disease: Secondary | ICD-10-CM | POA: Diagnosis not present

## 2021-08-08 DIAGNOSIS — T8249XD Other complication of vascular dialysis catheter, subsequent encounter: Secondary | ICD-10-CM | POA: Diagnosis not present

## 2021-08-08 DIAGNOSIS — Z992 Dependence on renal dialysis: Secondary | ICD-10-CM | POA: Diagnosis not present

## 2021-08-08 DIAGNOSIS — N2581 Secondary hyperparathyroidism of renal origin: Secondary | ICD-10-CM | POA: Diagnosis not present

## 2021-08-10 DIAGNOSIS — E876 Hypokalemia: Secondary | ICD-10-CM | POA: Diagnosis not present

## 2021-08-10 DIAGNOSIS — D689 Coagulation defect, unspecified: Secondary | ICD-10-CM | POA: Diagnosis not present

## 2021-08-10 DIAGNOSIS — Z9119 Patient's noncompliance with other medical treatment and regimen: Secondary | ICD-10-CM | POA: Diagnosis not present

## 2021-08-10 DIAGNOSIS — D649 Anemia, unspecified: Secondary | ICD-10-CM | POA: Diagnosis not present

## 2021-08-10 DIAGNOSIS — Z7689 Persons encountering health services in other specified circumstances: Secondary | ICD-10-CM | POA: Diagnosis not present

## 2021-08-10 DIAGNOSIS — T8249XD Other complication of vascular dialysis catheter, subsequent encounter: Secondary | ICD-10-CM | POA: Diagnosis not present

## 2021-08-10 DIAGNOSIS — D509 Iron deficiency anemia, unspecified: Secondary | ICD-10-CM | POA: Diagnosis not present

## 2021-08-10 DIAGNOSIS — N2581 Secondary hyperparathyroidism of renal origin: Secondary | ICD-10-CM | POA: Diagnosis not present

## 2021-08-10 DIAGNOSIS — E875 Hyperkalemia: Secondary | ICD-10-CM | POA: Diagnosis not present

## 2021-08-10 DIAGNOSIS — N186 End stage renal disease: Secondary | ICD-10-CM | POA: Diagnosis not present

## 2021-08-10 DIAGNOSIS — Z992 Dependence on renal dialysis: Secondary | ICD-10-CM | POA: Diagnosis not present

## 2021-08-10 DIAGNOSIS — Z23 Encounter for immunization: Secondary | ICD-10-CM | POA: Diagnosis not present

## 2021-08-15 DIAGNOSIS — Z7689 Persons encountering health services in other specified circumstances: Secondary | ICD-10-CM | POA: Diagnosis not present

## 2021-08-15 DIAGNOSIS — D689 Coagulation defect, unspecified: Secondary | ICD-10-CM | POA: Diagnosis not present

## 2021-08-15 DIAGNOSIS — N186 End stage renal disease: Secondary | ICD-10-CM | POA: Diagnosis not present

## 2021-08-15 DIAGNOSIS — N2581 Secondary hyperparathyroidism of renal origin: Secondary | ICD-10-CM | POA: Diagnosis not present

## 2021-08-15 DIAGNOSIS — E875 Hyperkalemia: Secondary | ICD-10-CM | POA: Diagnosis not present

## 2021-08-15 DIAGNOSIS — T8249XD Other complication of vascular dialysis catheter, subsequent encounter: Secondary | ICD-10-CM | POA: Diagnosis not present

## 2021-08-15 DIAGNOSIS — D649 Anemia, unspecified: Secondary | ICD-10-CM | POA: Diagnosis not present

## 2021-08-15 DIAGNOSIS — D509 Iron deficiency anemia, unspecified: Secondary | ICD-10-CM | POA: Diagnosis not present

## 2021-08-15 DIAGNOSIS — Z992 Dependence on renal dialysis: Secondary | ICD-10-CM | POA: Diagnosis not present

## 2021-08-15 DIAGNOSIS — Z9119 Patient's noncompliance with other medical treatment and regimen: Secondary | ICD-10-CM | POA: Diagnosis not present

## 2021-08-15 DIAGNOSIS — Z23 Encounter for immunization: Secondary | ICD-10-CM | POA: Diagnosis not present

## 2021-08-15 DIAGNOSIS — E876 Hypokalemia: Secondary | ICD-10-CM | POA: Diagnosis not present

## 2021-08-27 DIAGNOSIS — T8249XD Other complication of vascular dialysis catheter, subsequent encounter: Secondary | ICD-10-CM | POA: Diagnosis not present

## 2021-08-27 DIAGNOSIS — Z7689 Persons encountering health services in other specified circumstances: Secondary | ICD-10-CM | POA: Diagnosis not present

## 2021-08-27 DIAGNOSIS — D509 Iron deficiency anemia, unspecified: Secondary | ICD-10-CM | POA: Diagnosis not present

## 2021-08-27 DIAGNOSIS — E875 Hyperkalemia: Secondary | ICD-10-CM | POA: Diagnosis not present

## 2021-08-27 DIAGNOSIS — N2581 Secondary hyperparathyroidism of renal origin: Secondary | ICD-10-CM | POA: Diagnosis not present

## 2021-08-27 DIAGNOSIS — E876 Hypokalemia: Secondary | ICD-10-CM | POA: Diagnosis not present

## 2021-08-27 DIAGNOSIS — Z23 Encounter for immunization: Secondary | ICD-10-CM | POA: Diagnosis not present

## 2021-08-27 DIAGNOSIS — Z9119 Patient's noncompliance with other medical treatment and regimen: Secondary | ICD-10-CM | POA: Diagnosis not present

## 2021-08-27 DIAGNOSIS — D689 Coagulation defect, unspecified: Secondary | ICD-10-CM | POA: Diagnosis not present

## 2021-08-27 DIAGNOSIS — Z992 Dependence on renal dialysis: Secondary | ICD-10-CM | POA: Diagnosis not present

## 2021-08-27 DIAGNOSIS — N186 End stage renal disease: Secondary | ICD-10-CM | POA: Diagnosis not present

## 2021-08-27 DIAGNOSIS — D649 Anemia, unspecified: Secondary | ICD-10-CM | POA: Diagnosis not present

## 2021-08-29 ENCOUNTER — Telehealth: Payer: Self-pay

## 2021-08-29 NOTE — Telephone Encounter (Signed)
Received request from [facility]/Dr. Madelon Lips to reschedule patient for dialysis access placement. Multiple attempts made on 9/23, 9/26 and 08/29/21 to reach patient with no answer or vm available. Contacted dialysis center to update on status. Will send pt UTR letter.

## 2021-09-03 DIAGNOSIS — Z7689 Persons encountering health services in other specified circumstances: Secondary | ICD-10-CM | POA: Diagnosis not present

## 2021-09-05 DIAGNOSIS — Z7689 Persons encountering health services in other specified circumstances: Secondary | ICD-10-CM | POA: Diagnosis not present

## 2021-09-12 DIAGNOSIS — N186 End stage renal disease: Secondary | ICD-10-CM | POA: Diagnosis not present

## 2021-09-12 DIAGNOSIS — N2581 Secondary hyperparathyroidism of renal origin: Secondary | ICD-10-CM | POA: Diagnosis not present

## 2021-09-12 DIAGNOSIS — Z7689 Persons encountering health services in other specified circumstances: Secondary | ICD-10-CM | POA: Diagnosis not present

## 2021-09-12 DIAGNOSIS — T8249XD Other complication of vascular dialysis catheter, subsequent encounter: Secondary | ICD-10-CM | POA: Diagnosis not present

## 2021-09-12 DIAGNOSIS — D649 Anemia, unspecified: Secondary | ICD-10-CM | POA: Diagnosis not present

## 2021-09-12 DIAGNOSIS — Z992 Dependence on renal dialysis: Secondary | ICD-10-CM | POA: Diagnosis not present

## 2021-09-12 DIAGNOSIS — I160 Hypertensive urgency: Secondary | ICD-10-CM | POA: Diagnosis not present

## 2021-09-12 DIAGNOSIS — D689 Coagulation defect, unspecified: Secondary | ICD-10-CM | POA: Diagnosis not present

## 2021-09-12 DIAGNOSIS — Z23 Encounter for immunization: Secondary | ICD-10-CM | POA: Diagnosis not present

## 2021-09-12 DIAGNOSIS — D509 Iron deficiency anemia, unspecified: Secondary | ICD-10-CM | POA: Diagnosis not present

## 2021-09-17 DIAGNOSIS — T8249XD Other complication of vascular dialysis catheter, subsequent encounter: Secondary | ICD-10-CM | POA: Diagnosis not present

## 2021-09-17 DIAGNOSIS — I160 Hypertensive urgency: Secondary | ICD-10-CM | POA: Diagnosis not present

## 2021-09-17 DIAGNOSIS — N186 End stage renal disease: Secondary | ICD-10-CM | POA: Diagnosis not present

## 2021-09-17 DIAGNOSIS — Z992 Dependence on renal dialysis: Secondary | ICD-10-CM | POA: Diagnosis not present

## 2021-09-17 DIAGNOSIS — D649 Anemia, unspecified: Secondary | ICD-10-CM | POA: Diagnosis not present

## 2021-09-17 DIAGNOSIS — D689 Coagulation defect, unspecified: Secondary | ICD-10-CM | POA: Diagnosis not present

## 2021-09-17 DIAGNOSIS — Z23 Encounter for immunization: Secondary | ICD-10-CM | POA: Diagnosis not present

## 2021-09-17 DIAGNOSIS — Z7689 Persons encountering health services in other specified circumstances: Secondary | ICD-10-CM | POA: Diagnosis not present

## 2021-09-17 DIAGNOSIS — D509 Iron deficiency anemia, unspecified: Secondary | ICD-10-CM | POA: Diagnosis not present

## 2021-09-17 DIAGNOSIS — N2581 Secondary hyperparathyroidism of renal origin: Secondary | ICD-10-CM | POA: Diagnosis not present

## 2021-09-26 DIAGNOSIS — Z7689 Persons encountering health services in other specified circumstances: Secondary | ICD-10-CM | POA: Diagnosis not present

## 2021-10-01 DIAGNOSIS — M3214 Glomerular disease in systemic lupus erythematosus: Secondary | ICD-10-CM | POA: Diagnosis not present

## 2021-10-01 DIAGNOSIS — N186 End stage renal disease: Secondary | ICD-10-CM | POA: Diagnosis not present

## 2021-10-01 DIAGNOSIS — Z992 Dependence on renal dialysis: Secondary | ICD-10-CM | POA: Diagnosis not present

## 2021-10-15 DIAGNOSIS — N2581 Secondary hyperparathyroidism of renal origin: Secondary | ICD-10-CM | POA: Diagnosis not present

## 2021-10-15 DIAGNOSIS — Z992 Dependence on renal dialysis: Secondary | ICD-10-CM | POA: Diagnosis not present

## 2021-10-15 DIAGNOSIS — D689 Coagulation defect, unspecified: Secondary | ICD-10-CM | POA: Diagnosis not present

## 2021-10-15 DIAGNOSIS — D509 Iron deficiency anemia, unspecified: Secondary | ICD-10-CM | POA: Diagnosis not present

## 2021-10-15 DIAGNOSIS — N186 End stage renal disease: Secondary | ICD-10-CM | POA: Diagnosis not present

## 2021-10-15 DIAGNOSIS — E875 Hyperkalemia: Secondary | ICD-10-CM | POA: Diagnosis not present

## 2021-10-15 DIAGNOSIS — D649 Anemia, unspecified: Secondary | ICD-10-CM | POA: Diagnosis not present

## 2021-10-15 DIAGNOSIS — Z7689 Persons encountering health services in other specified circumstances: Secondary | ICD-10-CM | POA: Diagnosis not present

## 2021-10-15 DIAGNOSIS — E876 Hypokalemia: Secondary | ICD-10-CM | POA: Diagnosis not present

## 2021-10-15 DIAGNOSIS — T8249XD Other complication of vascular dialysis catheter, subsequent encounter: Secondary | ICD-10-CM | POA: Diagnosis not present

## 2021-10-17 DIAGNOSIS — E876 Hypokalemia: Secondary | ICD-10-CM | POA: Diagnosis not present

## 2021-10-17 DIAGNOSIS — D649 Anemia, unspecified: Secondary | ICD-10-CM | POA: Diagnosis not present

## 2021-10-17 DIAGNOSIS — T8249XD Other complication of vascular dialysis catheter, subsequent encounter: Secondary | ICD-10-CM | POA: Diagnosis not present

## 2021-10-17 DIAGNOSIS — N186 End stage renal disease: Secondary | ICD-10-CM | POA: Diagnosis not present

## 2021-10-17 DIAGNOSIS — Z7689 Persons encountering health services in other specified circumstances: Secondary | ICD-10-CM | POA: Diagnosis not present

## 2021-10-17 DIAGNOSIS — D689 Coagulation defect, unspecified: Secondary | ICD-10-CM | POA: Diagnosis not present

## 2021-10-17 DIAGNOSIS — N2581 Secondary hyperparathyroidism of renal origin: Secondary | ICD-10-CM | POA: Diagnosis not present

## 2021-10-17 DIAGNOSIS — D509 Iron deficiency anemia, unspecified: Secondary | ICD-10-CM | POA: Diagnosis not present

## 2021-10-17 DIAGNOSIS — Z992 Dependence on renal dialysis: Secondary | ICD-10-CM | POA: Diagnosis not present

## 2021-10-17 DIAGNOSIS — E875 Hyperkalemia: Secondary | ICD-10-CM | POA: Diagnosis not present

## 2021-10-22 DIAGNOSIS — E875 Hyperkalemia: Secondary | ICD-10-CM | POA: Diagnosis not present

## 2021-10-22 DIAGNOSIS — D649 Anemia, unspecified: Secondary | ICD-10-CM | POA: Diagnosis not present

## 2021-10-22 DIAGNOSIS — E876 Hypokalemia: Secondary | ICD-10-CM | POA: Diagnosis not present

## 2021-10-22 DIAGNOSIS — Z992 Dependence on renal dialysis: Secondary | ICD-10-CM | POA: Diagnosis not present

## 2021-10-22 DIAGNOSIS — T8249XD Other complication of vascular dialysis catheter, subsequent encounter: Secondary | ICD-10-CM | POA: Diagnosis not present

## 2021-10-22 DIAGNOSIS — D689 Coagulation defect, unspecified: Secondary | ICD-10-CM | POA: Diagnosis not present

## 2021-10-22 DIAGNOSIS — N2581 Secondary hyperparathyroidism of renal origin: Secondary | ICD-10-CM | POA: Diagnosis not present

## 2021-10-22 DIAGNOSIS — N186 End stage renal disease: Secondary | ICD-10-CM | POA: Diagnosis not present

## 2021-10-22 DIAGNOSIS — Z7689 Persons encountering health services in other specified circumstances: Secondary | ICD-10-CM | POA: Diagnosis not present

## 2021-10-22 DIAGNOSIS — D509 Iron deficiency anemia, unspecified: Secondary | ICD-10-CM | POA: Diagnosis not present

## 2021-10-29 DIAGNOSIS — E875 Hyperkalemia: Secondary | ICD-10-CM | POA: Diagnosis not present

## 2021-10-29 DIAGNOSIS — Z7689 Persons encountering health services in other specified circumstances: Secondary | ICD-10-CM | POA: Diagnosis not present

## 2021-10-29 DIAGNOSIS — E876 Hypokalemia: Secondary | ICD-10-CM | POA: Diagnosis not present

## 2021-10-29 DIAGNOSIS — N186 End stage renal disease: Secondary | ICD-10-CM | POA: Diagnosis not present

## 2021-10-29 DIAGNOSIS — D649 Anemia, unspecified: Secondary | ICD-10-CM | POA: Diagnosis not present

## 2021-10-29 DIAGNOSIS — D689 Coagulation defect, unspecified: Secondary | ICD-10-CM | POA: Diagnosis not present

## 2021-10-29 DIAGNOSIS — Z992 Dependence on renal dialysis: Secondary | ICD-10-CM | POA: Diagnosis not present

## 2021-10-29 DIAGNOSIS — T8249XD Other complication of vascular dialysis catheter, subsequent encounter: Secondary | ICD-10-CM | POA: Diagnosis not present

## 2021-10-29 DIAGNOSIS — D509 Iron deficiency anemia, unspecified: Secondary | ICD-10-CM | POA: Diagnosis not present

## 2021-10-29 DIAGNOSIS — N2581 Secondary hyperparathyroidism of renal origin: Secondary | ICD-10-CM | POA: Diagnosis not present

## 2021-11-02 DIAGNOSIS — N186 End stage renal disease: Secondary | ICD-10-CM | POA: Diagnosis not present

## 2021-11-02 DIAGNOSIS — D649 Anemia, unspecified: Secondary | ICD-10-CM | POA: Diagnosis not present

## 2021-11-02 DIAGNOSIS — Z992 Dependence on renal dialysis: Secondary | ICD-10-CM | POA: Diagnosis not present

## 2021-11-02 DIAGNOSIS — Z7689 Persons encountering health services in other specified circumstances: Secondary | ICD-10-CM | POA: Diagnosis not present

## 2021-11-02 DIAGNOSIS — N2581 Secondary hyperparathyroidism of renal origin: Secondary | ICD-10-CM | POA: Diagnosis not present

## 2021-11-02 DIAGNOSIS — E875 Hyperkalemia: Secondary | ICD-10-CM | POA: Diagnosis not present

## 2021-11-02 DIAGNOSIS — E876 Hypokalemia: Secondary | ICD-10-CM | POA: Diagnosis not present

## 2021-11-02 DIAGNOSIS — R52 Pain, unspecified: Secondary | ICD-10-CM | POA: Diagnosis not present

## 2021-11-02 DIAGNOSIS — D689 Coagulation defect, unspecified: Secondary | ICD-10-CM | POA: Diagnosis not present

## 2021-11-02 DIAGNOSIS — T8249XD Other complication of vascular dialysis catheter, subsequent encounter: Secondary | ICD-10-CM | POA: Diagnosis not present

## 2021-11-02 DIAGNOSIS — D509 Iron deficiency anemia, unspecified: Secondary | ICD-10-CM | POA: Diagnosis not present

## 2021-11-21 DIAGNOSIS — D509 Iron deficiency anemia, unspecified: Secondary | ICD-10-CM | POA: Diagnosis not present

## 2021-11-21 DIAGNOSIS — Z992 Dependence on renal dialysis: Secondary | ICD-10-CM | POA: Diagnosis not present

## 2021-11-21 DIAGNOSIS — T8249XD Other complication of vascular dialysis catheter, subsequent encounter: Secondary | ICD-10-CM | POA: Diagnosis not present

## 2021-11-21 DIAGNOSIS — E876 Hypokalemia: Secondary | ICD-10-CM | POA: Diagnosis not present

## 2021-11-21 DIAGNOSIS — D649 Anemia, unspecified: Secondary | ICD-10-CM | POA: Diagnosis not present

## 2021-11-21 DIAGNOSIS — Z7689 Persons encountering health services in other specified circumstances: Secondary | ICD-10-CM | POA: Diagnosis not present

## 2021-11-21 DIAGNOSIS — N2581 Secondary hyperparathyroidism of renal origin: Secondary | ICD-10-CM | POA: Diagnosis not present

## 2021-11-21 DIAGNOSIS — N186 End stage renal disease: Secondary | ICD-10-CM | POA: Diagnosis not present

## 2021-11-21 DIAGNOSIS — D689 Coagulation defect, unspecified: Secondary | ICD-10-CM | POA: Diagnosis not present

## 2021-11-21 DIAGNOSIS — R52 Pain, unspecified: Secondary | ICD-10-CM | POA: Diagnosis not present

## 2021-11-21 DIAGNOSIS — E875 Hyperkalemia: Secondary | ICD-10-CM | POA: Diagnosis not present

## 2021-11-26 DIAGNOSIS — D689 Coagulation defect, unspecified: Secondary | ICD-10-CM | POA: Diagnosis not present

## 2021-11-26 DIAGNOSIS — N2581 Secondary hyperparathyroidism of renal origin: Secondary | ICD-10-CM | POA: Diagnosis not present

## 2021-11-26 DIAGNOSIS — D509 Iron deficiency anemia, unspecified: Secondary | ICD-10-CM | POA: Diagnosis not present

## 2021-11-26 DIAGNOSIS — E875 Hyperkalemia: Secondary | ICD-10-CM | POA: Diagnosis not present

## 2021-11-26 DIAGNOSIS — Z992 Dependence on renal dialysis: Secondary | ICD-10-CM | POA: Diagnosis not present

## 2021-11-26 DIAGNOSIS — N186 End stage renal disease: Secondary | ICD-10-CM | POA: Diagnosis not present

## 2021-11-26 DIAGNOSIS — Z7689 Persons encountering health services in other specified circumstances: Secondary | ICD-10-CM | POA: Diagnosis not present

## 2021-11-26 DIAGNOSIS — E876 Hypokalemia: Secondary | ICD-10-CM | POA: Diagnosis not present

## 2021-11-26 DIAGNOSIS — R52 Pain, unspecified: Secondary | ICD-10-CM | POA: Diagnosis not present

## 2021-11-26 DIAGNOSIS — T8249XD Other complication of vascular dialysis catheter, subsequent encounter: Secondary | ICD-10-CM | POA: Diagnosis not present

## 2021-11-26 DIAGNOSIS — D649 Anemia, unspecified: Secondary | ICD-10-CM | POA: Diagnosis not present

## 2021-12-03 DIAGNOSIS — Z7689 Persons encountering health services in other specified circumstances: Secondary | ICD-10-CM | POA: Diagnosis not present

## 2021-12-05 DIAGNOSIS — E875 Hyperkalemia: Secondary | ICD-10-CM | POA: Diagnosis not present

## 2021-12-05 DIAGNOSIS — D509 Iron deficiency anemia, unspecified: Secondary | ICD-10-CM | POA: Diagnosis not present

## 2021-12-05 DIAGNOSIS — Z992 Dependence on renal dialysis: Secondary | ICD-10-CM | POA: Diagnosis not present

## 2021-12-05 DIAGNOSIS — N2581 Secondary hyperparathyroidism of renal origin: Secondary | ICD-10-CM | POA: Diagnosis not present

## 2021-12-05 DIAGNOSIS — D649 Anemia, unspecified: Secondary | ICD-10-CM | POA: Diagnosis not present

## 2021-12-05 DIAGNOSIS — D689 Coagulation defect, unspecified: Secondary | ICD-10-CM | POA: Diagnosis not present

## 2021-12-05 DIAGNOSIS — T8249XD Other complication of vascular dialysis catheter, subsequent encounter: Secondary | ICD-10-CM | POA: Diagnosis not present

## 2021-12-05 DIAGNOSIS — R52 Pain, unspecified: Secondary | ICD-10-CM | POA: Diagnosis not present

## 2021-12-05 DIAGNOSIS — E876 Hypokalemia: Secondary | ICD-10-CM | POA: Diagnosis not present

## 2021-12-05 DIAGNOSIS — N186 End stage renal disease: Secondary | ICD-10-CM | POA: Diagnosis not present

## 2021-12-05 DIAGNOSIS — Z7689 Persons encountering health services in other specified circumstances: Secondary | ICD-10-CM | POA: Diagnosis not present

## 2021-12-07 DIAGNOSIS — Z7689 Persons encountering health services in other specified circumstances: Secondary | ICD-10-CM | POA: Diagnosis not present

## 2021-12-09 DIAGNOSIS — N186 End stage renal disease: Secondary | ICD-10-CM | POA: Diagnosis not present

## 2021-12-10 DIAGNOSIS — N186 End stage renal disease: Secondary | ICD-10-CM | POA: Diagnosis not present

## 2021-12-10 DIAGNOSIS — N2581 Secondary hyperparathyroidism of renal origin: Secondary | ICD-10-CM | POA: Diagnosis not present

## 2021-12-10 DIAGNOSIS — R52 Pain, unspecified: Secondary | ICD-10-CM | POA: Diagnosis not present

## 2021-12-10 DIAGNOSIS — D649 Anemia, unspecified: Secondary | ICD-10-CM | POA: Diagnosis not present

## 2021-12-10 DIAGNOSIS — T8249XD Other complication of vascular dialysis catheter, subsequent encounter: Secondary | ICD-10-CM | POA: Diagnosis not present

## 2021-12-10 DIAGNOSIS — Z992 Dependence on renal dialysis: Secondary | ICD-10-CM | POA: Diagnosis not present

## 2021-12-10 DIAGNOSIS — E875 Hyperkalemia: Secondary | ICD-10-CM | POA: Diagnosis not present

## 2021-12-10 DIAGNOSIS — D509 Iron deficiency anemia, unspecified: Secondary | ICD-10-CM | POA: Diagnosis not present

## 2021-12-10 DIAGNOSIS — Z7689 Persons encountering health services in other specified circumstances: Secondary | ICD-10-CM | POA: Diagnosis not present

## 2021-12-10 DIAGNOSIS — D689 Coagulation defect, unspecified: Secondary | ICD-10-CM | POA: Diagnosis not present

## 2021-12-10 DIAGNOSIS — E876 Hypokalemia: Secondary | ICD-10-CM | POA: Diagnosis not present

## 2021-12-11 DIAGNOSIS — N186 End stage renal disease: Secondary | ICD-10-CM | POA: Diagnosis not present

## 2021-12-12 DIAGNOSIS — E876 Hypokalemia: Secondary | ICD-10-CM | POA: Diagnosis not present

## 2021-12-12 DIAGNOSIS — D649 Anemia, unspecified: Secondary | ICD-10-CM | POA: Diagnosis not present

## 2021-12-12 DIAGNOSIS — Z992 Dependence on renal dialysis: Secondary | ICD-10-CM | POA: Diagnosis not present

## 2021-12-12 DIAGNOSIS — D509 Iron deficiency anemia, unspecified: Secondary | ICD-10-CM | POA: Diagnosis not present

## 2021-12-12 DIAGNOSIS — E875 Hyperkalemia: Secondary | ICD-10-CM | POA: Diagnosis not present

## 2021-12-12 DIAGNOSIS — R52 Pain, unspecified: Secondary | ICD-10-CM | POA: Diagnosis not present

## 2021-12-12 DIAGNOSIS — N2581 Secondary hyperparathyroidism of renal origin: Secondary | ICD-10-CM | POA: Diagnosis not present

## 2021-12-12 DIAGNOSIS — Z7689 Persons encountering health services in other specified circumstances: Secondary | ICD-10-CM | POA: Diagnosis not present

## 2021-12-12 DIAGNOSIS — D689 Coagulation defect, unspecified: Secondary | ICD-10-CM | POA: Diagnosis not present

## 2021-12-12 DIAGNOSIS — N186 End stage renal disease: Secondary | ICD-10-CM | POA: Diagnosis not present

## 2021-12-12 DIAGNOSIS — T8249XD Other complication of vascular dialysis catheter, subsequent encounter: Secondary | ICD-10-CM | POA: Diagnosis not present

## 2021-12-13 ENCOUNTER — Inpatient Hospital Stay (HOSPITAL_COMMUNITY): Payer: Medicare Other

## 2021-12-13 ENCOUNTER — Encounter (HOSPITAL_COMMUNITY): Payer: Self-pay | Admitting: *Deleted

## 2021-12-13 ENCOUNTER — Emergency Department (HOSPITAL_COMMUNITY): Payer: Medicare Other

## 2021-12-13 ENCOUNTER — Encounter: Payer: Self-pay | Admitting: Hematology and Oncology

## 2021-12-13 ENCOUNTER — Inpatient Hospital Stay (HOSPITAL_COMMUNITY)
Admission: EM | Admit: 2021-12-13 | Discharge: 2021-12-17 | DRG: 871 | Disposition: A | Discharge status: Home / Self Care | Payer: Medicare Other | Attending: Internal Medicine | Admitting: Internal Medicine

## 2021-12-13 DIAGNOSIS — Z681 Body mass index (BMI) 19 or less, adult: Secondary | ICD-10-CM

## 2021-12-13 DIAGNOSIS — J90 Pleural effusion, not elsewhere classified: Secondary | ICD-10-CM

## 2021-12-13 DIAGNOSIS — J939 Pneumothorax, unspecified: Secondary | ICD-10-CM | POA: Diagnosis not present

## 2021-12-13 DIAGNOSIS — J918 Pleural effusion in other conditions classified elsewhere: Secondary | ICD-10-CM | POA: Diagnosis not present

## 2021-12-13 DIAGNOSIS — R6520 Severe sepsis without septic shock: Secondary | ICD-10-CM | POA: Diagnosis not present

## 2021-12-13 DIAGNOSIS — Z87891 Personal history of nicotine dependence: Secondary | ICD-10-CM

## 2021-12-13 DIAGNOSIS — I5042 Chronic combined systolic (congestive) and diastolic (congestive) heart failure: Secondary | ICD-10-CM | POA: Diagnosis not present

## 2021-12-13 DIAGNOSIS — J9600 Acute respiratory failure, unspecified whether with hypoxia or hypercapnia: Secondary | ICD-10-CM

## 2021-12-13 DIAGNOSIS — E871 Hypo-osmolality and hyponatremia: Secondary | ICD-10-CM | POA: Diagnosis not present

## 2021-12-13 DIAGNOSIS — R0989 Other specified symptoms and signs involving the circulatory and respiratory systems: Secondary | ICD-10-CM | POA: Diagnosis not present

## 2021-12-13 DIAGNOSIS — J9601 Acute respiratory failure with hypoxia: Secondary | ICD-10-CM | POA: Diagnosis not present

## 2021-12-13 DIAGNOSIS — Z992 Dependence on renal dialysis: Secondary | ICD-10-CM

## 2021-12-13 DIAGNOSIS — K219 Gastro-esophageal reflux disease without esophagitis: Secondary | ICD-10-CM | POA: Diagnosis present

## 2021-12-13 DIAGNOSIS — Y95 Nosocomial condition: Secondary | ICD-10-CM | POA: Diagnosis present

## 2021-12-13 DIAGNOSIS — E440 Moderate protein-calorie malnutrition: Secondary | ICD-10-CM | POA: Diagnosis present

## 2021-12-13 DIAGNOSIS — I21A1 Myocardial infarction type 2: Secondary | ICD-10-CM | POA: Diagnosis not present

## 2021-12-13 DIAGNOSIS — J189 Pneumonia, unspecified organism: Secondary | ICD-10-CM | POA: Diagnosis present

## 2021-12-13 DIAGNOSIS — I120 Hypertensive chronic kidney disease with stage 5 chronic kidney disease or end stage renal disease: Secondary | ICD-10-CM | POA: Diagnosis not present

## 2021-12-13 DIAGNOSIS — I132 Hypertensive heart and chronic kidney disease with heart failure and with stage 5 chronic kidney disease, or end stage renal disease: Secondary | ICD-10-CM | POA: Diagnosis present

## 2021-12-13 DIAGNOSIS — Z4682 Encounter for fitting and adjustment of non-vascular catheter: Secondary | ICD-10-CM

## 2021-12-13 DIAGNOSIS — M329 Systemic lupus erythematosus, unspecified: Secondary | ICD-10-CM | POA: Diagnosis present

## 2021-12-13 DIAGNOSIS — J9811 Atelectasis: Secondary | ICD-10-CM | POA: Diagnosis not present

## 2021-12-13 DIAGNOSIS — I517 Cardiomegaly: Secondary | ICD-10-CM | POA: Diagnosis not present

## 2021-12-13 DIAGNOSIS — M3214 Glomerular disease in systemic lupus erythematosus: Secondary | ICD-10-CM | POA: Diagnosis not present

## 2021-12-13 DIAGNOSIS — Z9981 Dependence on supplemental oxygen: Secondary | ICD-10-CM

## 2021-12-13 DIAGNOSIS — E876 Hypokalemia: Secondary | ICD-10-CM | POA: Diagnosis not present

## 2021-12-13 DIAGNOSIS — Z833 Family history of diabetes mellitus: Secondary | ICD-10-CM | POA: Diagnosis not present

## 2021-12-13 DIAGNOSIS — Z20822 Contact with and (suspected) exposure to covid-19: Secondary | ICD-10-CM | POA: Diagnosis present

## 2021-12-13 DIAGNOSIS — Z79899 Other long term (current) drug therapy: Secondary | ICD-10-CM

## 2021-12-13 DIAGNOSIS — Z8249 Family history of ischemic heart disease and other diseases of the circulatory system: Secondary | ICD-10-CM | POA: Diagnosis not present

## 2021-12-13 DIAGNOSIS — I5020 Unspecified systolic (congestive) heart failure: Secondary | ICD-10-CM | POA: Diagnosis not present

## 2021-12-13 DIAGNOSIS — Z9689 Presence of other specified functional implants: Secondary | ICD-10-CM | POA: Diagnosis not present

## 2021-12-13 DIAGNOSIS — E8809 Other disorders of plasma-protein metabolism, not elsewhere classified: Secondary | ICD-10-CM | POA: Diagnosis present

## 2021-12-13 DIAGNOSIS — F3130 Bipolar disorder, current episode depressed, mild or moderate severity, unspecified: Secondary | ICD-10-CM | POA: Diagnosis present

## 2021-12-13 DIAGNOSIS — J9621 Acute and chronic respiratory failure with hypoxia: Secondary | ICD-10-CM | POA: Diagnosis not present

## 2021-12-13 DIAGNOSIS — R1909 Other intra-abdominal and pelvic swelling, mass and lump: Secondary | ICD-10-CM | POA: Diagnosis not present

## 2021-12-13 DIAGNOSIS — G9340 Encephalopathy, unspecified: Secondary | ICD-10-CM

## 2021-12-13 DIAGNOSIS — N2581 Secondary hyperparathyroidism of renal origin: Secondary | ICD-10-CM | POA: Diagnosis present

## 2021-12-13 DIAGNOSIS — M3212 Pericarditis in systemic lupus erythematosus: Secondary | ICD-10-CM | POA: Diagnosis present

## 2021-12-13 DIAGNOSIS — N250 Renal osteodystrophy: Secondary | ICD-10-CM | POA: Diagnosis not present

## 2021-12-13 DIAGNOSIS — R109 Unspecified abdominal pain: Secondary | ICD-10-CM | POA: Diagnosis not present

## 2021-12-13 DIAGNOSIS — A4102 Sepsis due to Methicillin resistant Staphylococcus aureus: Secondary | ICD-10-CM | POA: Diagnosis not present

## 2021-12-13 DIAGNOSIS — Z7689 Persons encountering health services in other specified circumstances: Secondary | ICD-10-CM | POA: Diagnosis not present

## 2021-12-13 DIAGNOSIS — D631 Anemia in chronic kidney disease: Secondary | ICD-10-CM | POA: Diagnosis present

## 2021-12-13 DIAGNOSIS — A419 Sepsis, unspecified organism: Secondary | ICD-10-CM | POA: Diagnosis not present

## 2021-12-13 DIAGNOSIS — R918 Other nonspecific abnormal finding of lung field: Secondary | ICD-10-CM | POA: Diagnosis not present

## 2021-12-13 DIAGNOSIS — R079 Chest pain, unspecified: Secondary | ICD-10-CM | POA: Diagnosis not present

## 2021-12-13 DIAGNOSIS — I3139 Other pericardial effusion (noninflammatory): Secondary | ICD-10-CM

## 2021-12-13 DIAGNOSIS — N186 End stage renal disease: Secondary | ICD-10-CM | POA: Diagnosis not present

## 2021-12-13 DIAGNOSIS — N739 Female pelvic inflammatory disease, unspecified: Secondary | ICD-10-CM | POA: Diagnosis present

## 2021-12-13 DIAGNOSIS — R0689 Other abnormalities of breathing: Secondary | ICD-10-CM | POA: Diagnosis not present

## 2021-12-13 DIAGNOSIS — F419 Anxiety disorder, unspecified: Secondary | ICD-10-CM | POA: Diagnosis present

## 2021-12-13 DIAGNOSIS — M898X9 Other specified disorders of bone, unspecified site: Secondary | ICD-10-CM | POA: Diagnosis present

## 2021-12-13 DIAGNOSIS — J9690 Respiratory failure, unspecified, unspecified whether with hypoxia or hypercapnia: Secondary | ICD-10-CM | POA: Diagnosis not present

## 2021-12-13 DIAGNOSIS — R0602 Shortness of breath: Secondary | ICD-10-CM | POA: Diagnosis not present

## 2021-12-13 DIAGNOSIS — R0600 Dyspnea, unspecified: Secondary | ICD-10-CM | POA: Diagnosis not present

## 2021-12-13 DIAGNOSIS — E875 Hyperkalemia: Secondary | ICD-10-CM | POA: Diagnosis present

## 2021-12-13 DIAGNOSIS — I499 Cardiac arrhythmia, unspecified: Secondary | ICD-10-CM | POA: Diagnosis not present

## 2021-12-13 LAB — I-STAT VENOUS BLOOD GAS, ED
Acid-Base Excess: 7 mmol/L — ABNORMAL HIGH (ref 0.0–2.0)
Bicarbonate: 30.2 mmol/L — ABNORMAL HIGH (ref 20.0–28.0)
Calcium, Ion: 1.01 mmol/L — ABNORMAL LOW (ref 1.15–1.40)
HCT: 25 % — ABNORMAL LOW (ref 36.0–46.0)
Hemoglobin: 8.5 g/dL — ABNORMAL LOW (ref 12.0–15.0)
O2 Saturation: 90 %
Potassium: 4.9 mmol/L (ref 3.5–5.1)
Sodium: 131 mmol/L — ABNORMAL LOW (ref 135–145)
TCO2: 31 mmol/L (ref 22–32)
pCO2, Ven: 38.4 mmHg — ABNORMAL LOW (ref 44.0–60.0)
pH, Ven: 7.504 — ABNORMAL HIGH (ref 7.250–7.430)
pO2, Ven: 52 mmHg — ABNORMAL HIGH (ref 32.0–45.0)

## 2021-12-13 LAB — COMPREHENSIVE METABOLIC PANEL
ALT: 40 U/L (ref 0–44)
AST: 76 U/L — ABNORMAL HIGH (ref 15–41)
Albumin: 3.1 g/dL — ABNORMAL LOW (ref 3.5–5.0)
Alkaline Phosphatase: 341 U/L — ABNORMAL HIGH (ref 38–126)
Anion gap: 14 (ref 5–15)
BUN: 23 mg/dL — ABNORMAL HIGH (ref 6–20)
CO2: 26 mmol/L (ref 22–32)
Calcium: 8.8 mg/dL — ABNORMAL LOW (ref 8.9–10.3)
Chloride: 92 mmol/L — ABNORMAL LOW (ref 98–111)
Creatinine, Ser: 6.53 mg/dL — ABNORMAL HIGH (ref 0.44–1.00)
GFR, Estimated: 8 mL/min — ABNORMAL LOW (ref >60)
Glucose, Bld: 97 mg/dL (ref 70–99)
Potassium: 5 mmol/L (ref 3.5–5.1)
Sodium: 132 mmol/L — ABNORMAL LOW (ref 135–145)
Total Bilirubin: 1 mg/dL (ref 0.3–1.2)
Total Protein: 6.6 g/dL (ref 6.5–8.1)

## 2021-12-13 LAB — CBC WITH DIFFERENTIAL/PLATELET
Abs Immature Granulocytes: 0.1 10*3/uL — ABNORMAL HIGH (ref 0.00–0.07)
Basophils Absolute: 0 10*3/uL (ref 0.0–0.1)
Basophils Relative: 0 %
Eosinophils Absolute: 0 10*3/uL (ref 0.0–0.5)
Eosinophils Relative: 0 %
HCT: 24.7 % — ABNORMAL LOW (ref 36.0–46.0)
Hemoglobin: 7.4 g/dL — ABNORMAL LOW (ref 12.0–15.0)
Immature Granulocytes: 1 %
Lymphocytes Relative: 9 %
Lymphs Abs: 1.1 10*3/uL (ref 0.7–4.0)
MCH: 27.1 pg (ref 26.0–34.0)
MCHC: 30 g/dL (ref 30.0–36.0)
MCV: 90.5 fL (ref 80.0–100.0)
Monocytes Absolute: 1.4 10*3/uL — ABNORMAL HIGH (ref 0.1–1.0)
Monocytes Relative: 12 %
Neutro Abs: 9.7 10*3/uL — ABNORMAL HIGH (ref 1.7–7.7)
Neutrophils Relative %: 78 %
Platelets: 186 10*3/uL (ref 150–400)
RBC: 2.73 MIL/uL — ABNORMAL LOW (ref 3.87–5.11)
RDW: 16.9 % — ABNORMAL HIGH (ref 11.5–15.5)
WBC: 12.3 10*3/uL — ABNORMAL HIGH (ref 4.0–10.5)
nRBC: 0 % (ref 0.0–0.2)

## 2021-12-13 LAB — PROTIME-INR
INR: 1.2 (ref 0.8–1.2)
Prothrombin Time: 14.8 seconds (ref 11.4–15.2)

## 2021-12-13 LAB — BODY FLUID CELL COUNT WITH DIFFERENTIAL
Eos, Fluid: 0 %
Lymphs, Fluid: 0 %
Monocyte-Macrophage-Serous Fluid: 0 % — ABNORMAL LOW (ref 50–90)
Neutrophil Count, Fluid: 100 % — ABNORMAL HIGH (ref 0–25)
Total Nucleated Cell Count, Fluid: 2650 cu mm — ABNORMAL HIGH (ref 0–1000)

## 2021-12-13 LAB — I-STAT BETA HCG BLOOD, ED (MC, WL, AP ONLY): I-stat hCG, quantitative: <5 m[IU]/mL (ref <5)

## 2021-12-13 LAB — GLUCOSE, CAPILLARY
Glucose-Capillary: 87 mg/dL (ref 70–99)
Glucose-Capillary: 90 mg/dL (ref 70–99)
Glucose-Capillary: 86 mg/dL (ref 70–99)

## 2021-12-13 LAB — ECHOCARDIOGRAM COMPLETE
AR max vel: 1.4 cm2
AV Area VTI: 1.18 cm2
AV Area mean vel: 1.19 cm2
AV Mean grad: 4 mmHg
AV Peak grad: 6.1 mmHg
Ao pk vel: 1.23 m/s
Area-P 1/2: 5.34 cm2
Calc EF: 22.5 %
P 1/2 time: 462 msec
S' Lateral: 3.9 cm
Single Plane A2C EF: 25.2 %
Single Plane A4C EF: 8.5 %

## 2021-12-13 LAB — LACTIC ACID, PLASMA
Lactic Acid, Venous: 1.2 mmol/L (ref 0.5–1.9)
Lactic Acid, Venous: 1.5 mmol/L (ref 0.5–1.9)

## 2021-12-13 LAB — LACTATE DEHYDROGENASE: LDH: 434 U/L — ABNORMAL HIGH (ref 98–192)

## 2021-12-13 LAB — RESP PANEL BY RT-PCR (FLU A&B, COVID) ARPGX2
Influenza A by PCR: NEGATIVE
Influenza B by PCR: NEGATIVE
SARS Coronavirus 2 by RT PCR: NEGATIVE

## 2021-12-13 LAB — APTT: aPTT: 30 seconds (ref 24–36)

## 2021-12-13 LAB — BRAIN NATRIURETIC PEPTIDE: B Natriuretic Peptide: >4500 pg/mL — ABNORMAL HIGH (ref 0.0–100.0)

## 2021-12-13 LAB — PROTEIN, PLEURAL OR PERITONEAL FLUID: Total protein, fluid: <3 g/dL

## 2021-12-13 LAB — LACTATE DEHYDROGENASE, PLEURAL OR PERITONEAL FLUID: LD, Fluid: 192 U/L — ABNORMAL HIGH (ref 3–23)

## 2021-12-13 LAB — TROPONIN I (HIGH SENSITIVITY)
Troponin I (High Sensitivity): 88 ng/L — ABNORMAL HIGH (ref <18)
Troponin I (High Sensitivity): 95 ng/L — ABNORMAL HIGH (ref <18)

## 2021-12-13 LAB — PROTEIN, TOTAL: Total Protein: 6.8 g/dL (ref 6.5–8.1)

## 2021-12-13 LAB — MRSA NEXT GEN BY PCR, NASAL: MRSA by PCR Next Gen: DETECTED — AB

## 2021-12-13 LAB — HIV ANTIBODY (ROUTINE TESTING W REFLEX): HIV Screen 4th Generation wRfx: NONREACTIVE

## 2021-12-13 MED ORDER — MORPHINE SULFATE (PF) 2 MG/ML IV SOLN
2.0000 mg | INTRAVENOUS | Status: DC | PRN
  Administered 2021-12-13 – 2021-12-16 (×13): 2 mg via INTRAVENOUS
  Filled 2021-12-13 (×14): qty 1

## 2021-12-13 MED ORDER — VANCOMYCIN VARIABLE DOSE PER UNSTABLE RENAL FUNCTION (PHARMACIST DOSING): Status: DC

## 2021-12-13 MED ORDER — CARVEDILOL 12.5 MG PO TABS
12.5000 mg | ORAL_TABLET | Freq: Two times a day (BID) | ORAL | Status: DC
  Administered 2021-12-13 – 2021-12-16 (×7): 12.5 mg via ORAL
  Filled 2021-12-13 (×8): qty 1

## 2021-12-13 MED ORDER — SODIUM CHLORIDE 0.9 % IV SOLN
125.0000 mg | INTRAVENOUS | Status: DC
  Filled 2021-12-13: qty 10

## 2021-12-13 MED ORDER — ACETAMINOPHEN 500 MG PO TABS
1000.0000 mg | ORAL_TABLET | Freq: Once | ORAL | Status: AC
  Administered 2021-12-13: 1000 mg via ORAL
  Filled 2021-12-13: qty 2

## 2021-12-13 MED ORDER — DOCUSATE SODIUM 100 MG PO CAPS
100.0000 mg | ORAL_CAPSULE | Freq: Two times a day (BID) | ORAL | Status: DC | PRN
  Administered 2021-12-15: 100 mg via ORAL
  Filled 2021-12-13: qty 1

## 2021-12-13 MED ORDER — HYDRALAZINE HCL 20 MG/ML IJ SOLN: 10.0000 mg | INTRAMUSCULAR | Status: DC | PRN

## 2021-12-13 MED ORDER — SODIUM CHLORIDE 0.9% FLUSH
10.0000 mL | Freq: Two times a day (BID) | INTRAVENOUS | Status: DC
  Administered 2021-12-13 – 2021-12-16 (×2): 10 mL

## 2021-12-13 MED ORDER — IOHEXOL 350 MG/ML SOLN
100.0000 mL | Freq: Once | INTRAVENOUS | Status: AC | PRN
  Administered 2021-12-13: 100 mL via INTRAVENOUS

## 2021-12-13 MED ORDER — HEPARIN SODIUM (PORCINE) 5000 UNIT/ML IJ SOLN
5000.0000 [IU] | Freq: Three times a day (TID) | INTRAMUSCULAR | Status: DC
  Administered 2021-12-13 – 2021-12-17 (×11): 5000 [IU] via SUBCUTANEOUS
  Filled 2021-12-13 (×11): qty 1

## 2021-12-13 MED ORDER — MUPIROCIN 2 % EX OINT
1.0000 "application " | TOPICAL_OINTMENT | Freq: Two times a day (BID) | CUTANEOUS | Status: DC
  Administered 2021-12-13 – 2021-12-16 (×7): 1 via NASAL
  Filled 2021-12-13 (×2): qty 22

## 2021-12-13 MED ORDER — DOXERCALCIFEROL 4 MCG/2ML IV SOLN
4.0000 ug | INTRAVENOUS | Status: DC
  Filled 2021-12-13 (×2): qty 2

## 2021-12-13 MED ORDER — CHLORHEXIDINE GLUCONATE CLOTH 2 % EX PADS
6.0000 | MEDICATED_PAD | Freq: Every day | CUTANEOUS | Status: DC
  Administered 2021-12-13 – 2021-12-16 (×4): 6 via TOPICAL

## 2021-12-13 MED ORDER — POLYETHYLENE GLYCOL 3350 17 G PO PACK: 17.0000 g | PACK | Freq: Every day | ORAL | Status: DC | PRN

## 2021-12-13 MED ORDER — ONDANSETRON HCL 4 MG/2ML IJ SOLN
4.0000 mg | Freq: Once | INTRAMUSCULAR | Status: AC
  Administered 2021-12-13: 4 mg via INTRAVENOUS
  Filled 2021-12-13: qty 2

## 2021-12-13 MED ORDER — HYDRALAZINE HCL 25 MG PO TABS
25.0000 mg | ORAL_TABLET | Freq: Three times a day (TID) | ORAL | Status: DC
  Administered 2021-12-13 – 2021-12-17 (×12): 25 mg via ORAL
  Filled 2021-12-13 (×13): qty 1

## 2021-12-13 MED ORDER — VANCOMYCIN HCL IN DEXTROSE 1-5 GM/200ML-% IV SOLN
1000.0000 mg | Freq: Once | INTRAVENOUS | Status: AC
  Administered 2021-12-13: 1000 mg via INTRAVENOUS
  Filled 2021-12-13: qty 200

## 2021-12-13 MED ORDER — DARBEPOETIN ALFA 200 MCG/0.4ML IJ SOSY
200.0000 ug | PREFILLED_SYRINGE | INTRAMUSCULAR | Status: DC
  Administered 2021-12-13: 200 ug via SUBCUTANEOUS
  Filled 2021-12-13: qty 0.4

## 2021-12-13 MED ORDER — SODIUM CHLORIDE 0.9 % IV SOLN
1.0000 g | INTRAVENOUS | Status: DC
  Administered 2021-12-14 – 2021-12-15 (×2): 1 g via INTRAVENOUS
  Filled 2021-12-13 (×3): qty 1

## 2021-12-13 MED ORDER — SODIUM CHLORIDE 0.9 % IV SOLN: 2.0000 g | Freq: Once | INTRAVENOUS | Status: DC

## 2021-12-13 MED ORDER — MORPHINE SULFATE (PF) 4 MG/ML IV SOLN
3.0000 mg | Freq: Once | INTRAVENOUS | Status: AC
  Administered 2021-12-13: 3 mg via INTRAVENOUS
  Filled 2021-12-13: qty 1

## 2021-12-13 MED ORDER — SODIUM CHLORIDE 0.9% FLUSH
10.0000 mL | Freq: Three times a day (TID) | INTRAVENOUS | Status: DC
  Administered 2021-12-13 – 2021-12-17 (×11): 10 mL

## 2021-12-13 MED ORDER — ORAL CARE MOUTH RINSE
15.0000 mL | Freq: Two times a day (BID) | OROMUCOSAL | Status: DC
  Administered 2021-12-13 – 2021-12-16 (×4): 15 mL via OROMUCOSAL

## 2021-12-13 MED ORDER — METRONIDAZOLE 500 MG/100ML IV SOLN
500.0000 mg | Freq: Two times a day (BID) | INTRAVENOUS | Status: DC
  Administered 2021-12-13: 500 mg via INTRAVENOUS
  Filled 2021-12-13: qty 100

## 2021-12-13 MED ORDER — SODIUM CHLORIDE 0.9% FLUSH: 10.0000 mL | INTRAVENOUS | Status: DC | PRN

## 2021-12-13 MED ORDER — ACETAMINOPHEN 325 MG PO TABS: 650.0000 mg | ORAL_TABLET | Freq: Four times a day (QID) | ORAL | Status: DC | PRN

## 2021-12-13 MED ORDER — SODIUM CHLORIDE 0.9 % IV SOLN
2.0000 g | Freq: Once | INTRAVENOUS | Status: AC
  Administered 2021-12-13: 2 g via INTRAVENOUS
  Filled 2021-12-13: qty 2

## 2021-12-13 NOTE — ED Notes (Signed)
Got patient into a gown on the monitor patient is resting with call bell in reach 

## 2021-12-13 NOTE — Progress Notes (Signed)
° °  Echocardiogram 2D Echocardiogram has been performed.  Cathy [name] 12/13/2021, 3:48 PM

## 2021-12-13 NOTE — ED Triage Notes (Signed)
Pt arrived by [facility] from home. Reports sob x 1 month that has increased and has right side chest pain that increases with palpation and cough. Dialysis pt, last treatment was yesterday.

## 2021-12-13 NOTE — Progress Notes (Signed)
Pt placed on BiPAP 18/6 on 30% and is tolerating well. RT will continue to monitor.

## 2021-12-13 NOTE — Consult Note (Addendum)
Renal Service Consult Note [facility]  Cathy [name] 12/13/2021 Sol Blazing, MD Requesting Physician: Dr. Matilde Sprang  Reason for Consult: ESRD w/ SOB HPI: The patient is a 41 y.o. year-old w/ hx of anxiety, bipolar d/o, esrd on HD, SLE who presented today to ED w/ SOB and R sided chest pain, also cough and palpitations. Last HD was yesterday.  In ED BP's 160/116, RR 32 , HR 97, 98% on [place] 2L. K 4.9 creat 6.5. BNP Marland Kitchen.  Temp 103 deg. WBC 12K Hb 7.4. COViD negative. CXR shows large R effusion and prob L effusion/ consolidation as well. No edema. We are asked to see for ESRD.    Pt seen in ED.  On HD 1 year, last HD was yesterday. +new leg swelling below the knees, no other swelling.  Coughing a lot w/ tan colored sputum, +hot and cold.   ROS - denies CP, no joint pain, no HA, no blurry vision, no rash, no diarrhea, no nausea/ vomiting, no dysuria, no difficulty voiding   Past Medical History  Past Medical History:  Diagnosis Date   Anasarca associated with disorder of kidney 06/08/2021   Anxiety    Asthma    Bipolar depression ([place])    Depression    Dyspnea    ESRD (end stage renal disease) on dialysis ([place]) 10/2020   GERD (gastroesophageal reflux disease)    Gonorrhea    Lupus (HCC)    Pelvic inflammatory disease (PID)    Pleural effusion 06/08/2021   Renal hypertension    Trichomonas infection    Past Surgical History  Past Surgical History:  Procedure Laterality Date   IR FLUORO GUIDE CV LINE RIGHT  11/30/2020   IR FLUORO GUIDE CV LINE RIGHT  06/13/2021   IR REMOVAL TUN CV CATH W/O FL  06/11/2021   IR US GUIDE VASC ACCESS RIGHT  11/30/2020   IR US GUIDE VASC ACCESS RIGHT  06/13/2021   RENAL BIOPSY     TEE WITHOUT CARDIOVERSION N/A 06/13/2021   Procedure: TRANSESOPHAGEAL ECHOCARDIOGRAM (TEE);  Surgeon: Josue Hector, MD;  Location: [place];  Service: Cardiovascular;  Laterality: N/A;   TUBAL LIGATION N/A 02/03/2019   Procedure: POST PARTUM TUBAL  LIGATION;  Surgeon: Aletha Halim, MD;  Location: [facility];  Service: Gynecology;  Laterality: N/A;   Family History  Family History  Problem Relation Age of Onset   Diabetes Maternal Grandmother    Hypertension Maternal Grandmother    Diabetes Maternal Grandfather    Hypertension Maternal Grandfather    Diabetes Paternal Grandmother    Hypertension Paternal Grandmother    Diabetes Paternal Grandfather    Hypertension Paternal Grandfather    Social History  reports that she quit smoking about 14 months ago. Her smoking use included cigarettes. She smoked an average of .25 packs per day. She has never used smokeless tobacco. She reports that she does not currently use alcohol. She reports that she does not currently use drugs after having used the following drugs: Marijuana. Allergies No Known Allergies Home medications Prior to Admission medications   Medication Sig Start Date End Date Taking? Authorizing Provider  acetaminophen (TYLENOL) 500 MG tablet Take 1,000 mg by mouth every 6 (six) hours as needed for moderate pain.    [provider]  albuterol (VENTOLIN HFA) 108 (90 Base) MCG/ACT inhaler Inhale 2 puffs into the lungs 2 (two) times daily. 06/14/21 06/14/22  Edwin Dada, MD  carvedilol (COREG) 25 MG tablet Take 1  tablet (25 mg total) by mouth 2 (two) times daily with a meal. 06/14/21   Danford, Suann Larry, MD  ipratropium (ATROVENT HFA) 17 MCG/ACT inhaler Inhale 2 puffs into the lungs every 6 (six) hours as needed for wheezing. 06/14/21 06/14/22  Danford, Suann Larry, MD  irbesartan (AVAPRO) 300 MG tablet Take 1 tablet (300 mg total) by mouth daily. 06/15/21   Danford, Suann Larry, MD  isosorbide-hydrALAZINE (BIDIL) 20-37.5 MG tablet Take 2 tablets by mouth 3 (three) times daily. 06/14/21   Danford, Suann Larry, MD  Nutritional Supplements (,FEEDING SUPPLEMENT, PROSOURCE PLUS) liquid Take 30 mLs by mouth 3 (three) times daily between meals. 06/14/21   Danford,  Suann Larry, MD  pantoprazole (PROTONIX) 40 MG tablet TAKE 1 TABLET (40 MG TOTAL) BY MOUTH DAILY. 12/31/20 12/31/21  Regalado, Belkys A, MD  torsemide (DEMADEX) 20 MG tablet TAKE 3 TABLETS (60 MG TOTAL) BY MOUTH 2 (TWO) TIMES DAILY. 12/31/20 12/31/21  Regalado, Jerald Kief A, MD  fenofibrate 160 MG tablet Take 1 tablet (160 mg total) by mouth daily. 12/31/20 01/20/21  Regalado, Jerald Kief A, MD     Vitals:   12/13/21 1115 12/13/21 1130 12/13/21 1145 12/13/21 1215  BP: (!) 172/130 (!) 175/138 (!) 168/127 (!) 164/124  Pulse: (!) 102 98 92 94  Resp: (!) 28 (!) 39 (!) 33 (!) 27  Temp:      TempSrc:      SpO2: 100% 100% 98% 100%   Exam Gen alert, nasal O2, chronically ill appearing, not in distress, lying flat in CT room No rash, cyanosis or gangrene Sclera anicteric, throat clear  No jvd or bruits Chest dec'd R base, L mostly clear, no wheezing RRR no MRG Abd soft ntnd no mass or ascites +bs GU deferred MS no joint effusions or deformity Ext 1-2+ bilat pretib edema, no other edema Neuro is alert, Ox 3 , nf  RIJ TDC clean exit site    Home meds include - coreg 25 bid, atrovent, avapro 300, bidil 20-37.5 tid, protonix, demadex 60 bid, prosource plus liquid, prns/ vits / supps    OP HD: [place] TTS  4h   2/2 bath  56kg  Hep none   R TDC  - hect 5 ug tiw  - mircera 225 last 12/22  - venofer 100mg  , 6 more doses   Na 132  BUN 23  Cr 6.5    alb 3.1  BNP >4500 wBC 12  Hb 7.4  COVID neg   BP 170/ 115, 3L 99%  RR 26- 40 COVID neg    Assessment/ Plan: SOB/ acute hypoxic resp failure - w/ fever 103deg, R chest pain, prod cough, large R infiltrate+effusion by CT and CXR. Suspect PNA.  Has some vol overload in the legs, and high BP's.  Not in distress when I saw her. Will plan HD later today in ICU.  ESRD - usual HD TTS.  HD today off schedule for possible vol excess SLE - not on any medications at this time HTN - cont home meds, vol removal w/ HD Anemia ckd - Hb 8.5. Hold IV Fe w/ infection. Hb  down 7.4, resume high dose esa here w/ darbe 200 ug sq weekly.  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 will add on phos      Rob Vernis [place]  MD 12/13/2021, 1:01 PM  Recent Labs  Lab 12/13/21 1005 12/13/21 1136  WBC 12.3*  --   HGB 7.4* 8.5*   Recent Labs  Lab 12/13/21 1005 12/13/21  1136  K 5.0 4.9  BUN 23*  --   CREATININE 6.53*  --   CALCIUM 8.8*  --

## 2021-12-13 NOTE — H&P (Signed)
NAME:  Cathy [name], MRN:  [id number], DOB:  26-Sep-1981, LOS: 0 ADMISSION DATE:  12/13/2021, CONSULTATION DATE:  12/13/2021 REFERRING MD:  Dr. Matilde Sprang, CHIEF COMPLAINT:  Sepsis   History of Present Illness:  HPI obtained mostly from chart review as patient is able to give limited information, on biPAP and poor historian.    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  Unclear when fever started or if productive cough.  Last iHD on 1/12 with full treatment.    In ER, febrile up to 103, hypertensive, tachycardic, tachpneic, and sats 98% on baseline 2L.  Labs noted for WBC 12K, Hgb 7.4, Na 132, sCr 6.53, Alk phos 341, albumin 3.1, AST 76, trop hs 88 > 95, and BNP > 4.5K (has been elevated > 4.5K since 2021), normal coags.  Venous/ mixed gas 7.5/ 38/ 52/ 30.  CXR noted for bibasilar densities, R pleural effusion > L, and with stable but enlarged cardiac silhouette.  Underwent CTA PE which was negative for PE but noted ectasia of main pulmonary artery suggestive of PAH and large patchy infiltrate of RLL and small patchy infiltrate in RLL, with moderate R pleural effusion, small L pleural effusion, and moderate to large pericardial effusion.  CT abd/ pelvis noted for moderate volume intraperitoneal free fluid, and small benign appearing cyst in pancreas.  She was placed on BiPAP to help with work of breathing.  Cultures sent and empirically started on vancomycin and cefepime.  Nephrology consulted in ER with plans for iHD later this evening.  PCCM called for admit.   Pertinent  Medical History  Systolic and diastolic HF (5/02 EF 77-41%), HTN, ESRD TTS iHD 2/2 SLE (on iHD for ~1 yr), bipolar, anxiety/ depression  -  MSSA bacteremia 2/2 infected line in 05/2021  Significant Hospital Events: Including procedures, antibiotic start and stop dates in addition to other pertinent events   1/13 admitted, RML/ RLL PNA, sepsis, complicated R pleural effusion, acute on chronic HF  1/13 cefepime / vanc   1/13 Bcx2 >  1/13 SARS/ flu  > neg 1/13 MRSA PCR >  1/13 R pleural fluid >    Interim History / Subjective:    Objective   Blood pressure (!) 178/136, pulse 95, temperature (!) 103.1 F (39.5 C), temperature source Oral, resp. rate (!) 31, SpO2 100 %, unknown if currently breastfeeding.    FiO2 (%):  [30 %] 30 %   Intake/Output Summary (Last 24 hours) at 12/13/2021 1458 Last data filed at 12/13/2021 1343 Gross per 24 hour  Intake 190.59 ml  Output --  Net 190.59 ml   There were no vitals filed for this visit.  Examination: General:  chronically ill appearing adult female sitting upright on NIV in NAD HEENT: full face mask, + JVD Neuro: responds to loud verbal/ touch otherwise sleepy s/p morphine, appropriate, MAE  CV: rr, no murmur- no muffled, R TDC- site clean, no erythema or drainage PULM:  currently on BiPAP 16/6 30% with TV mid 300's, anteriorly clear, diminished in bases, no wheeze GI: soft, +bs Extremities: warm/dry, +1 bilateral tibial edema    Resolved Hospital Problem list    Assessment & Plan:   Sepsis secondary to PNA with  complicated right pleural effusion - admit to ICU  - remains hypertensive - continue vanc and cefepime for now, likely can de-escalate based on culture data - follow cultures, trend CBC/ fever curve - UA when able    Acute on chronic hypoxic respiratory failure on home O2 2L  Multifocal PNA Pleural effusion, R> L  - BiPAP prn increased WOB and wean supplemental back to baseline home O2 2L  - complicated by bedside ultrasound  - s/p pigtail catheter insertion (see separate note per Dr. Silas Flood)  - sending pleural studies for protein, LDH, culture, cell  count w/diff and serum LDH/ protein - tylenol and prn morphine 71m q 3hr prn for pain - abx as above  - send urine legionella/ strep   HTN Suspected acute on chronic systolic and diastolic HF/ hypervolemia  - pending TTE  -  EKG non acute, trop hs trend 88> 95 - need pharmacy to clarify home meds prior to restarting - prn hydralazine    ESRD  - TTS iHD, last dialysis 1/12 (reported full treatment), still makes some urine, ? 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  Recs for f/u MRI in 1 yr.   Best Practice (right click and "Reselect all SmartList Selections" daily)   Diet/type: NPO DVT prophylaxis: prophylactic heparin  GI prophylaxis: N/A Lines: Dialysis Catheter (POA R TDC) Foley:  N/A Code Status:  full code Last date of multidisciplinary goals of care discussion [pending]  Labs   CBC: Recent Labs  Lab 12/13/21 1005 12/13/21 1136  WBC 12.3*  --   NEUTROABS 9.7*  --   HGB 7.4* 8.5*  HCT 24.7* 25.0*  MCV 90.5  --   PLT 186  --     Basic Metabolic Panel: Recent Labs  Lab 12/13/21 1005 12/13/21 1136  NA 132* 131*  K 5.0 4.9  CL 92*  --   CO2 26  --   GLUCOSE 97  --   BUN 23*  --   CREATININE 6.53*  --   CALCIUM 8.8*  --    GFR: CrCl cannot be calculated (Unknown ideal weight.). Recent Labs  Lab 12/13/21 1005 12/13/21 1214  WBC 12.3*  --   LATICACIDVEN 1.2 1.5    Liver Function Tests: Recent Labs  Lab 12/13/21 1005  AST 76*  ALT 40  ALKPHOS 341*  BILITOT 1.0  PROT 6.6  ALBUMIN 3.1*   No results for input(s): LIPASE, AMYLASE in the last 168 hours. No results for input(s): AMMONIA in the last 168 hours.  ABG     Component Value Date/Time   PHART 7.335 (L) 01/18/2021 2111   PCO2ART 45.7 01/18/2021 2111   PO2ART 150 (H) 01/18/2021 2111   HCO3 30.2 (H) 12/13/2021 1136   TCO2 31 12/13/2021 1136   ACIDBASEDEF 1.0 01/18/2021 2111   O2SAT 90.0 12/13/2021 1136     Coagulation Profile: Recent Labs  Lab 12/13/21 1005  INR 1.2    Cardiac Enzymes: No results for input(s): CKTOTAL, CKMB, CKMBINDEX, TROPONINI in the last 168 hours.  HbA1C: No results found for: HGBA1C  CBG: No results  for input(s): GLUCAP in the last 168 hours.  Review of Systems:   Limited as per HPI   Past Medical History:  She,  has a past medical history of Anasarca associated with disorder of kidney (06/08/2021), Anxiety, Asthma, Bipolar depression ([place]), Depression, Dyspnea, ESRD (end stage renal disease) on dialysis ([place]) (10/2020), GERD (gastroesophageal reflux disease), Gonorrhea, Lupus ([place]), Pelvic inflammatory disease (PID), Pleural effusion (06/08/2021), Renal hypertension, and Trichomonas infection.   Surgical History:   Past Surgical History:  Procedure Laterality Date   IR FLUORO GUIDE CV LINE RIGHT  11/30/2020   IR FLUORO GUIDE CV LINE RIGHT  06/13/2021   IR REMOVAL TUN CV CATH W/O FL  06/11/2021   IR US GUIDE VASC ACCESS RIGHT  11/30/2020   IR US GUIDE VASC ACCESS RIGHT  06/13/2021   RENAL BIOPSY     TEE WITHOUT CARDIOVERSION N/A 06/13/2021   Procedure: TRANSESOPHAGEAL ECHOCARDIOGRAM (TEE);  Surgeon: Josue Hector, MD;  Location: [facility];  Service: Cardiovascular;  Laterality: N/A;   TUBAL LIGATION N/A 02/03/2019   Procedure: POST PARTUM TUBAL LIGATION;  Surgeon: Aletha Halim, MD;  Location: [facility];  Service: Gynecology;  Laterality: N/A;     Social History:   reports that she quit smoking about 14 months ago. Her smoking use included cigarettes. She smoked an average of .25 packs per day. She has never used smokeless tobacco. She reports that she does not currently use alcohol. She reports that  she does not currently use drugs after having used the following drugs: Marijuana.   Family History:  Her family history includes Diabetes in her maternal grandfather, maternal grandmother, paternal grandfather, and paternal grandmother; Hypertension in her maternal grandfather, maternal grandmother, paternal grandfather, and paternal grandmother.   Allergies No Known Allergies   Home Medications  Prior to Admission medications   Medication Sig Start Date End Date Taking? Authorizing Provider  acetaminophen (TYLENOL) 500 MG tablet Take 1,000 mg by mouth every 6 (six) hours as needed for moderate pain.    [provider]  albuterol (VENTOLIN HFA) 108 (90 Base) MCG/ACT inhaler Inhale 2 puffs into the lungs 2 (two) times daily. 06/14/21 06/14/22  Danford, Suann Larry, MD  carvedilol (COREG) 25 MG tablet Take 1 tablet (25 mg total) by mouth 2 (two) times daily with a meal. 06/14/21   Danford, Suann Larry, MD  ipratropium (ATROVENT HFA) 17 MCG/ACT inhaler Inhale 2 puffs into the lungs every 6 (six) hours as needed for wheezing. 06/14/21 06/14/22  Danford, Suann Larry, MD  irbesartan (AVAPRO) 300 MG tablet Take 1 tablet (300 mg total) by mouth daily. 06/15/21   Danford, Suann Larry, MD  isosorbide-hydrALAZINE (BIDIL) 20-37.5 MG tablet Take 2 tablets by mouth 3 (three) times daily. 06/14/21   Danford, Suann Larry, MD  Nutritional Supplements (,FEEDING SUPPLEMENT, PROSOURCE PLUS) liquid Take 30 mLs by mouth 3 (three) times daily between meals. 06/14/21   Danford, Suann Larry, MD  pantoprazole (PROTONIX) 40 MG tablet TAKE 1 TABLET (40 MG TOTAL) BY MOUTH DAILY. 12/31/20 12/31/21  Regalado, Belkys A, MD  torsemide (DEMADEX) 20 MG tablet TAKE 3 TABLETS (60 MG TOTAL) BY MOUTH 2 (TWO) TIMES DAILY. 12/31/20 12/31/21  Regalado, Jerald Kief A, MD  fenofibrate 160 MG tablet Take 1 tablet (160 mg total) by mouth daily. 12/31/20 01/20/21  Elmarie Shiley, MD     Critical care time: 60 mins     Kennieth Rad, ACNP [facility] 12/13/2021, 4:13 PM  See Amion for pager  If no response to pager, please call PCCM consult pager After 7:00 pm call Elink

## 2021-12-13 NOTE — ED Notes (Signed)
Patient transported to CT 

## 2021-12-13 NOTE — Procedures (Signed)
Insertion of Chest Tube Procedure Note  Cathy [name]  [id number]  Oct 31, 1981  Date:12/13/21  Time:4:12 PM    Provider Performing: Bonna Gains Lexii Walsh   Procedure: Pleural Catheter Insertion w/ Imaging Guidance [phone])  Indication(s) Effusion  Consent Unable to obtain consent due to inability to find a medical decision maker for patient.  All reasonable efforts were made.  Another independent medical provider, Nira Conn, NP , confirmed the benefits of this procedure outweigh the risks.  Anesthesia Topical only with 1% lidocaine    Time Out Verified patient identification, verified procedure, site/side was marked, verified correct patient position, special equipment/implants available, medications/allergies/relevant history reviewed, required imaging and test results available.   Sterile Technique Maximal sterile technique including full sterile barrier drape, hand hygiene, sterile gown, sterile gloves, mask, hair covering, sterile ultrasound probe cover (if used).   Procedure Description Ultrasound used to identify appropriate pleural anatomy for placement and overlying skin marked. Area of placement cleaned and draped in sterile fashion.  A 14 French pigtail pleural catheter was placed into the right pleural space using Seldinger technique. Appropriate return of fluid was obtained.  The tube was connected to atrium and placed on -20 cm H2O wall suction.   Complications/Tolerance None; patient tolerated the procedure well. Chest X-ray is ordered to verify placement.   EBL Minimal  Specimen(s) fluid

## 2021-12-13 NOTE — Progress Notes (Signed)
Patient on 2lpm with Sp02=96%. Patient in no distress at this time. Bipap in the room, will continue to monitor patients respiratory status.

## 2021-12-13 NOTE — ED Provider Notes (Signed)
[facility] EMERGENCY DEPARTMENT Provider Note  CSN: [id number] Arrival date & time: 12/13/21 7026  Chief Complaint(s) Shortness of Breath  HPI Cathy [name] is a 41 y.o. 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  Patient states that for the last 2 weeks she has been getting progressively more short of breath but it severely worsened last night.  She endorses chest and abdominal pain and arrives looking critically ill, febrile, tachycardic and tachypneic.  She wears 2 L home oxygen at home and is maintaining her oxygen saturations at 100% on these 2 L.   Shortness of Breath Associated symptoms: abdominal pain, chest pain, cough and fever    Past Medical History Past Medical History:  Diagnosis Date   Anasarca associated with disorder of kidney 06/08/2021   Anxiety    Asthma    Bipolar depression ([place])    Depression    Dyspnea    ESRD (end stage renal disease) on dialysis ([place]) 10/2020   GERD (gastroesophageal reflux disease)    Gonorrhea    Lupus (HCC)    Pelvic inflammatory disease (PID)    Pleural effusion 06/08/2021   Renal hypertension    Trichomonas infection    Patient Active Problem List   Diagnosis Date Noted   Sepsis ([place]) 12/13/2021   MSSA (methicillin susceptible Staphylococcus aureus) septicemia ([place]) 06/08/2021   Benign essential HTN 06/08/2021   Pleural effusion 06/08/2021   Anasarca associated with disorder of kidney 06/08/2021   HFrEF (heart failure with reduced ejection fraction) ([place])    Encounter for antineoplastic chemotherapy 01/07/2021   Respiratory failure ([place]) 12/24/2020   Flash pulmonary edema ([place]) 12/23/2020   Hypertensive emergency 12/23/2020   CHF exacerbation ([place]) 12/23/2020   ESRD (end stage renal disease) ([place])  12/23/2020   Elevated troponin 12/23/2020   COVID-19 virus infection 12/23/2020   Lupus nephritis ([place]) 12/21/2020   Volume overload 11/29/2020   Hyperkalemia 11/29/2020   Pericardial effusion    Aortic atherosclerosis ([place]) 11/07/2020   Anasarca 11/07/2020   Abdominal pain 11/07/2020   Diarrhea 11/07/2020   Hydrosalpinx (bilateral) 02/07/2019   Pelvic adhesive disease 02/03/2019   Tubal ligation evaluation 02/03/2019   Renal hypertension 02/02/2019   Pulmonary edema 02/01/2019   AMA (advanced maternal age) multigravida 35+ 01/12/2019   Non compliance w medication regimen 01/12/2019   Current chronic use of systemic steroids 01/12/2019   Unwanted fertility 12/10/2018   Anemia 10/12/2018   Supervision of high risk pregnancy, antepartum 10/04/2018   Lupus (systemic lupus erythematosus) ([place]) 09/20/2018   Supervision of pregnancy with grand multiparity in second trimester 09/20/2018   Substance abuse ([place]) 09/20/2018   Acute renal failure (ARF) ([place]) 09/20/2018   Pleuritic chest pain 09/20/2018   MDD (major depressive disorder) 10/11/2015   Asthma 09/22/2011   Home Medication(s) Prior to Admission medications   Medication Sig Start Date End Date Taking? Authorizing Provider  acetaminophen (TYLENOL) 500 MG tablet Take 1,000 mg by mouth every 6 (six) hours as needed for moderate pain.    [provider]  albuterol (VENTOLIN HFA) 108 (90 Base) MCG/ACT inhaler Inhale 2 puffs into the lungs 2 (two) times daily. 06/14/21 06/14/22  Danford, Suann Larry, MD  carvedilol (COREG) 25 MG tablet Take 1 tablet (25 mg total) by mouth 2 (two) times daily with a meal. 06/14/21   Danford,  Suann Larry, MD  ipratropium (ATROVENT HFA) 17 MCG/ACT inhaler Inhale 2 puffs into the lungs every 6 (six) hours as needed for wheezing. 06/14/21 06/14/22  Danford, Suann Larry, MD  irbesartan (AVAPRO) 300 MG tablet Take 1 tablet (300 mg total) by mouth daily. 06/15/21   Danford, Suann Larry, MD   isosorbide-hydrALAZINE (BIDIL) 20-37.5 MG tablet Take 2 tablets by mouth 3 (three) times daily. 06/14/21   Danford, Suann Larry, MD  Nutritional Supplements (,FEEDING SUPPLEMENT, PROSOURCE PLUS) liquid Take 30 mLs by mouth 3 (three) times daily between meals. 06/14/21   Danford, Suann Larry, MD  pantoprazole (PROTONIX) 40 MG tablet TAKE 1 TABLET (40 MG TOTAL) BY MOUTH DAILY. 12/31/20 12/31/21  Regalado, Belkys A, MD  torsemide (DEMADEX) 20 MG tablet TAKE 3 TABLETS (60 MG TOTAL) BY MOUTH 2 (TWO) TIMES DAILY. 12/31/20 12/31/21  Regalado, Jerald Kief A, MD  fenofibrate 160 MG tablet Take 1 tablet (160 mg total) by mouth daily. 12/31/20 01/20/21  Elmarie Shiley, MD                                                                                                                                    Past Surgical History Past Surgical History:  Procedure Laterality Date   IR FLUORO GUIDE CV LINE RIGHT  11/30/2020   IR FLUORO GUIDE CV LINE RIGHT  06/13/2021   IR REMOVAL TUN CV CATH W/O FL  06/11/2021   IR US GUIDE VASC ACCESS RIGHT  11/30/2020   IR US GUIDE VASC ACCESS RIGHT  06/13/2021   RENAL BIOPSY     TEE WITHOUT CARDIOVERSION N/A 06/13/2021   Procedure: TRANSESOPHAGEAL ECHOCARDIOGRAM (TEE);  Surgeon: Josue Hector, MD;  Location: [facility];  Service: Cardiovascular;  Laterality: N/A;   TUBAL LIGATION N/A 02/03/2019   Procedure: POST PARTUM TUBAL LIGATION;  Surgeon: Aletha Halim, MD;  Location: [facility] LD [facility]S;  Service: Gynecology;  Laterality: N/A;   Family History Family History  Problem Relation Age of Onset   Diabetes Maternal Grandmother    Hypertension Maternal Grandmother    Diabetes Maternal Grandfather    Hypertension Maternal Grandfather    Diabetes Paternal Grandmother    Hypertension Paternal Grandmother    Diabetes Paternal Grandfather    Hypertension Paternal Grandfather     Social History Social History   Tobacco Use   Smoking status: Former    Packs/day: 0.25    Types:  Cigarettes    Quit date: 10/08/2020    Years since quitting: 1.1   Smokeless tobacco: Never  Vaping Use   Vaping Use: Never used  Substance Use Topics   Alcohol use: Not Currently   Drug use: Not Currently    Types: Marijuana    Comment: 3 times a week    Allergies Patient has no known allergies.  Review of Systems Review of Systems  Constitutional:  Positive for fatigue and fever.  Respiratory:  Positive for cough and  shortness of breath.   Cardiovascular:  Positive for chest pain.  Gastrointestinal:  Positive for abdominal pain.   Physical Exam Vital Signs  I have reviewed the triage vital signs BP (!) 148/112    Pulse 84    Temp (!) 103.1 F (39.5 C) (Oral)    Resp (!) 21    SpO2 100%   Physical Exam Vitals and nursing note reviewed.  Constitutional:      General: She is in acute distress.     Appearance: She is well-developed. She is ill-appearing.  HENT:     Head: Normocephalic and atraumatic.  Eyes:     Conjunctiva/sclera: Conjunctivae normal.  Cardiovascular:     Rate and Rhythm: Normal rate and regular rhythm.     Heart sounds: No murmur heard. Pulmonary:     Effort: Tachypnea present. No respiratory distress.     Breath sounds: Normal breath sounds.  Abdominal:     Palpations: Abdomen is soft.     Tenderness: There is abdominal tenderness.     Comments: Distention  Musculoskeletal:        General: No swelling.     Cervical back: Neck supple.  Skin:    General: Skin is warm and dry.     Capillary Refill: Capillary refill takes less than 2 seconds.  Neurological:     Mental Status: She is alert.  Psychiatric:        Mood and Affect: Mood normal.    ED Results and Treatments Labs (all labs ordered are listed, but only abnormal results are displayed) Labs Reviewed  CBC WITH DIFFERENTIAL/PLATELET - Abnormal; Notable for the following components:      Result Value   WBC 12.3 (*)    RBC 2.73 (*)    Hemoglobin 7.4 (*)    HCT 24.7 (*)    RDW 16.9  (*)    Neutro Abs 9.7 (*)    Monocytes Absolute 1.4 (*)    Abs Immature Granulocytes 0.10 (*)    All other components within normal limits  BRAIN NATRIURETIC PEPTIDE - Abnormal; Notable for the following components:   B Natriuretic Peptide >4,500.0 (*)    All other components within normal limits  COMPREHENSIVE METABOLIC PANEL - Abnormal; Notable for the following components:   Sodium 132 (*)    Chloride 92 (*)    BUN 23 (*)    Creatinine, Ser 6.53 (*)    Calcium 8.8 (*)    Albumin 3.1 (*)    AST 76 (*)    Alkaline Phosphatase 341 (*)    GFR, Estimated 8 (*)    All other components within normal limits  I-STAT VENOUS BLOOD GAS, ED - Abnormal; Notable for the following components:   pH, Ven 7.504 (*)    pCO2, Ven 38.4 (*)    pO2, Ven 52.0 (*)    Bicarbonate 30.2 (*)    Acid-Base Excess 7.0 (*)    Sodium 131 (*)    Calcium, Ion 1.01 (*)    HCT 25.0 (*)    Hemoglobin 8.5 (*)    All other components within normal limits  TROPONIN I (HIGH SENSITIVITY) - Abnormal; Notable for the following components:   Troponin I (High Sensitivity) 88 (*)    All other components within normal limits  TROPONIN I (HIGH SENSITIVITY) - Abnormal; Notable for the following components:   Troponin I (High Sensitivity) 95 (*)    All other components within normal limits  RESP PANEL BY RT-PCR (FLU A&B, COVID) ARPGX2  CULTURE, BLOOD (ROUTINE X 2)  CULTURE, BLOOD (ROUTINE X 2)  URINE CULTURE  BODY FLUID CULTURE W GRAM STAIN  MRSA NEXT GEN BY PCR, NASAL  LACTIC ACID, PLASMA  LACTIC ACID, PLASMA  PROTIME-INR  APTT  URINALYSIS, ROUTINE W REFLEX MICROSCOPIC  BLOOD GAS, VENOUS  HIV ANTIBODY (ROUTINE TESTING W REFLEX)  BODY FLUID CELL COUNT WITH DIFFERENTIAL  PROTEIN, PLEURAL OR PERITONEAL FLUID  LACTATE DEHYDROGENASE, PLEURAL OR PERITONEAL FLUID  PROTEIN, TOTAL  LACTATE DEHYDROGENASE  RENAL FUNCTION PANEL  I-STAT BETA HCG BLOOD, ED ([facility], [facility], [facility] ONLY)                                                                                                                           Radiology CT Angio Chest PE W and/or Wo Contrast  Result Date: 12/13/2021 CLINICAL DATA:  Shortness of breath, chest pain, epigastric pain EXAM: CT ANGIOGRAPHY CHEST WITH CONTRAST TECHNIQUE: Multidetector CT imaging of the chest was performed using the standard protocol during bolus administration of intravenous contrast. Multiplanar CT image reconstructions and MIPs were obtained to evaluate the vascular anatomy. RADIATION DOSE REDUCTION: This exam was performed according to the departmental dose-optimization program which includes automated exposure control, adjustment of the mA and/or kV according to patient size and/or use of iterative reconstruction technique. CONTRAST:  148mL OMNIPAQUE IOHEXOL 350 MG/ML SOLN COMPARISON:  02/01/2019 FINDINGS: Cardiovascular: Heart is enlarged in size. There is moderate to large pericardial effusion. There is no contrast enhancement in thoracic aorta limiting evaluation of the lumen of thoracic aorta. There are no intraluminal filling defects in central pulmonary artery branches. Evaluation of small subsegmental peripheral branches in the lower lung fields is limited by infiltrates. There is ectasia of main pulmonary artery measuring 4 cm. There is no evidence of right ventricular strain. Mediastinum/Nodes: No significant lymphadenopathy seen. Lungs/Pleura: There is moderate right pleural effusion. There is small left pleural effusion. Large patchy alveolar infiltrate is seen in right lower lobe. There are smaller patchy infiltrates in right middle lobe and left lower lobe. Upper Abdomen: Unremarkable. Musculoskeletal: Unremarkable. Review of the MIP images confirms the above findings. IMPRESSION: There is no evidence of central pulmonary artery embolism. Evaluation of small peripheral subsegmental branches in the lower lobes is limited by infiltrates. There is ectasia of main pulmonary artery suggesting  pulmonary arterial hypertension. There is large patchy alveolar infiltrate in the right lower lobe. Small patchy alveolar infiltrates are seen in the right middle lobe and left lower lobe. Findings suggest possible multifocal pneumonia. Moderate right pleural effusion. Small left pleural effusion. Moderate to large pericardial effusion. Electronically Signed   By: Elmer Picker M.D.   On: 12/13/2021 13:44   CT ABDOMEN PELVIS W CONTRAST  Result Date: 12/13/2021 CLINICAL DATA:  Abdominal pain.  Patient unable to lie flat EXAM: CT ABDOMEN AND PELVIS WITH CONTRAST TECHNIQUE: Multidetector CT imaging of the abdomen and pelvis was performed using the standard protocol following bolus administration of intravenous contrast. RADIATION DOSE  REDUCTION: This exam was performed according to the departmental dose-optimization program which includes automated exposure control, adjustment of the mA and/or kV according to patient size and/or use of iterative reconstruction technique. CONTRAST:  137mL OMNIPAQUE IOHEXOL 350 MG/ML SOLN COMPARISON:  Chest CT same day FINDINGS: Lower chest: RIGHT lobe pneumonia. Large RIGHT effusion with passive atelectasis in the RIGHT lower lobe. Cardiomegaly Hepatobiliary: No enhancing hepatic lesion. Portal veins are patent. There is periportal edema. Small fluid around the liver. Gallbladder normal. Pancreas: small cyst in the pancreas measures 6 mm (image 47/3). No worrisome characteristics. No communication with the pancreatic duct. No pancreatic duct dilatation. Spleen: Normal spleen Adrenals/urinary tract: Adrenal glands normal. Four enhancement of the renal parenchyma. No hydronephrosis. Ureters and bladder grossly normal. Bladder collapsed Stomach/Bowel: Stomach, small bowel normal. Colon rectosigmoid colon normal. Vascular/Lymphatic: Abdominal aorta is normal caliber. There is no retroperitoneal or periportal lymphadenopathy. No pelvic lymphadenopathy. Reproductive: Uterus and  adnexa unremarkable. Other: Normal moderate volume of intraperitoneal free fluid there is prominent pelvis. Mild anasarca soft tissues Musculoskeletal: No aggressive osseous lesion. IMPRESSION: 1. RIGHT lower lobe pneumonia. 2. Moderate volume intraperitoneal free fluid, anasarca and pleural effusions suggest volume overload/congestive heart failure. 3. Poor renal enhancement consistent with renal insufficiency. 4. Small benign appearing cyst in the pancreas. Per consensus recommendation, consider follow-up MRI in 1 year. This recommendation follows ACR consensus guidelines: Management of Incidental Pancreatic Cysts: A White Paper of the ACR Incidental Findings Committee. [place] [id number]. Electronically Signed   By: Suzy Bouchard M.D.   On: 12/13/2021 13:51   DG Chest Port 1 View  Result Date: 12/13/2021 CLINICAL DATA:  Questionable sepsis.  Evaluate for abnormality. EXAM: PORTABLE CHEST 1 VIEW COMPARISON:  06/15/2021 FINDINGS: There is a right jugular dialysis catheter with the tip extending into the right atrium. Bibasilar chest densities are compatible with pleural effusions and basilar consolidation or airspace disease. Pleural effusions are likely moderate in size particularly on the right side. Negative for pneumothorax. Cardiac silhouette appears to be enlarged and stable from the previous examination. IMPRESSION: New or enlarged bibasilar chest densities. Findings are suggestive for combination of pleural effusions with consolidation/airspace disease. Underlying pneumonia cannot be excluded. Dialysis catheter as described. Electronically Signed   By: Markus Daft M.D.   On: 12/13/2021 11:37    Pertinent labs & imaging results that were available during my care of the patient were reviewed by me and considered in my medical decision making (see MDM for details).  Medications Ordered in ED Medications  metroNIDAZOLE (FLAGYL) IVPB 500 mg (0 mg Intravenous Stopped 12/13/21 1308)   ceFEPIme (MAXIPIME) 1 g in sodium chloride 0.9 % 100 mL IVPB (has no administration in time range)  vancomycin variable dose per unstable renal function (pharmacist dosing) (has no administration in time range)  Chlorhexidine Gluconate Cloth 2 % PADS 6 each (has no administration in time range)  doxercalciferol (HECTOROL) injection 4 mcg (has no administration in time range)  ferric gluconate (FERRLECIT) 125 mg in sodium chloride 0.9 % 100 mL IVPB (has no administration in time range)  Darbepoetin Alfa (ARANESP) injection 200 mcg (has no administration in time range)  docusate sodium (COLACE) capsule 100 mg (has no administration in time range)  polyethylene glycol (MIRALAX / GLYCOLAX) packet 17 g (has no administration in time range)  heparin injection 5,000 Units (has no administration in time range)  sodium chloride flush (NS) 0.9 % injection 10 mL (has no administration in time range)  morphine 2 MG/ML injection  2 mg (has no administration in time range)  morphine 4 MG/ML injection 3 mg (3 mg Intravenous Given 12/13/21 1133)  ondansetron (ZOFRAN) injection 4 mg (4 mg Intravenous Given 12/13/21 1131)  vancomycin (VANCOCIN) IVPB 1000 mg/200 mL premix (0 mg Intravenous Stopped 12/13/21 1450)  ceFEPIme (MAXIPIME) 2 g in sodium chloride 0.9 % 100 mL IVPB (0 g Intravenous Stopped 12/13/21 1206)  acetaminophen (TYLENOL) tablet 1,000 mg (1,000 mg Oral Given 12/13/21 1343)  iohexol (OMNIPAQUE) 350 MG/ML injection 100 mL (100 mLs Intravenous Contrast Given 12/13/21 1326)                                                                                                                                     Procedures .Critical Care Performed by: Teressa Lower, MD Authorized by: Teressa Lower, MD   Critical care provider statement:    Critical care time (minutes):  75   Critical care was necessary to treat or prevent imminent or life-threatening deterioration of the following conditions:  Cardiac  failure, sepsis and respiratory failure   Critical care was time spent personally by me on the following activities:  Development of treatment plan with patient or surrogate, discussions with consultants, evaluation of patient's response to treatment, examination of patient, ordering and review of laboratory studies, ordering and review of radiographic studies, ordering and performing treatments and interventions, pulse oximetry, re-evaluation of patient's condition and review of old charts  (including critical care time)  Medical Decision Making / ED Course   This patient presents to the ED for concern of shortness of breath, this involves an extensive number of treatment options, and is a complaint that carries with it a high risk of complications and morbidity.  The differential diagnosis includes fluid overload, pulmonary edema, ACS, PE, sepsis  MDM: Patient seen emergency department for evaluation of shortness of breath, chest pain and abdominal pain.  Physical exam reveals an ill-appearing patient who is significantly tachypneic with decreased breath sounds in bilateral lung bases.  Patient also has significant abdominal tenderness to palpation in all quadrants.  She arrives febrile and tachypneic and thus a sepsis alert was called the patient started on broad-spectrum antibiotics with Vanco, cefepime and Flagyl.  Laboratory evaluation with initial blood gases 7.50, PCO2 38.4, initial lactate 1.5 and thus aggressive fluid resuscitation not pursued in the setting of a patient who is already fluid overloaded.  In addition, patient is not in septic shock and would not benefit from aggressive fluid resuscitation, Graciella Freer would be harmed.  Hyponatremia 132, creatinine elevated to 6.53, BNP greater than 4500.  Initial troponin 88 with delta troponin 95 likely type II NSTEMI with demand ischemia secondary to shortness of breath.  Due to persistent tachypnea and work of breathing, patient placed on BiPAP.   Bedside ultrasound showing very poor cardiac squeeze with a likely EF of approximately 15 to 20%.  There is also a moderate sized pericardial effusion that  has been seen on previous echoes.  Low suspicion for tamponade with normal blood pressures and a normal caliber IVC.  Nephrology was consulted for emergent dialysis in the setting of fluid overload and dyspnea.  CT abdomen pelvis and CT PE study with right lower lobe pneumonia.  ICU team then consulted who admitted the patient.   Additional history obtained:  -External records from outside source obtained and reviewed including: Chart review including previous notes, labs, imaging, consultation notes   Lab Tests: -I ordered, reviewed, and interpreted labs.   The pertinent results include:   Labs Reviewed  CBC WITH DIFFERENTIAL/PLATELET - Abnormal; Notable for the following components:      Result Value   WBC 12.3 (*)    RBC 2.73 (*)    Hemoglobin 7.4 (*)    HCT 24.7 (*)    RDW 16.9 (*)    Neutro Abs 9.7 (*)    Monocytes Absolute 1.4 (*)    Abs Immature Granulocytes 0.10 (*)    All other components within normal limits  BRAIN NATRIURETIC PEPTIDE - Abnormal; Notable for the following components:   B Natriuretic Peptide >4,500.0 (*)    All other components within normal limits  COMPREHENSIVE METABOLIC PANEL - Abnormal; Notable for the following components:   Sodium 132 (*)    Chloride 92 (*)    BUN 23 (*)    Creatinine, Ser 6.53 (*)    Calcium 8.8 (*)    Albumin 3.1 (*)    AST 76 (*)    Alkaline Phosphatase 341 (*)    GFR, Estimated 8 (*)    All other components within normal limits  I-STAT VENOUS BLOOD GAS, ED - Abnormal; Notable for the following components:   pH, Ven 7.504 (*)    pCO2, Ven 38.4 (*)    pO2, Ven 52.0 (*)    Bicarbonate 30.2 (*)    Acid-Base Excess 7.0 (*)    Sodium 131 (*)    Calcium, Ion 1.01 (*)    HCT 25.0 (*)    Hemoglobin 8.5 (*)    All other components within normal limits  TROPONIN I (HIGH  SENSITIVITY) - Abnormal; Notable for the following components:   Troponin I (High Sensitivity) 88 (*)    All other components within normal limits  TROPONIN I (HIGH SENSITIVITY) - Abnormal; Notable for the following components:   Troponin I (High Sensitivity) 95 (*)    All other components within normal limits  RESP PANEL BY RT-PCR (FLU A&B, COVID) ARPGX2  CULTURE, BLOOD (ROUTINE X 2)  CULTURE, BLOOD (ROUTINE X 2)  URINE CULTURE  BODY FLUID CULTURE W GRAM STAIN  MRSA NEXT GEN BY PCR, NASAL  LACTIC ACID, PLASMA  LACTIC ACID, PLASMA  PROTIME-INR  APTT  URINALYSIS, ROUTINE W REFLEX MICROSCOPIC  BLOOD GAS, VENOUS  HIV ANTIBODY (ROUTINE TESTING W REFLEX)  BODY FLUID CELL COUNT WITH DIFFERENTIAL  PROTEIN, PLEURAL OR PERITONEAL FLUID  LACTATE DEHYDROGENASE, PLEURAL OR PERITONEAL FLUID  PROTEIN, TOTAL  LACTATE DEHYDROGENASE  RENAL FUNCTION PANEL  I-STAT BETA HCG BLOOD, ED ([facility], [facility], [facility] ONLY)      EKG  EKG Interpretation  Date/Time:    Ventricular Rate:    PR Interval:    QRS Duration:   QT Interval:    QTC Calculation:   R Axis:     Text Interpretation:           Imaging Studies ordered: I ordered imaging studies including CT angio PE, CT abdomen pelvis with contrast, chest  x-ray I independently visualized and interpreted imaging. I agree with the radiologist interpretation   Medicines ordered and prescription drug management: Meds ordered this encounter  Medications   morphine 4 MG/ML injection 3 mg   ondansetron (ZOFRAN) injection 4 mg   metroNIDAZOLE (FLAGYL) IVPB 500 mg    Order Specific Question:   Antibiotic Indication:    Answer:   Intra-abdominal Infection   DISCONTD: ceFEPIme (MAXIPIME) 2 g in sodium chloride 0.9 % 100 mL IVPB    Order Specific Question:   Antibiotic Indication:    Answer:   Sepsis   vancomycin (VANCOCIN) IVPB 1000 mg/200 mL premix    Order Specific Question:   Indication:    Answer:   Sepsis   ceFEPIme (MAXIPIME) 2 g in sodium  chloride 0.9 % 100 mL IVPB    Order Specific Question:   Antibiotic Indication:    Answer:   Sepsis   ceFEPIme (MAXIPIME) 1 g in sodium chloride 0.9 % 100 mL IVPB    Order Specific Question:   Antibiotic Indication:    Answer:   Sepsis   vancomycin variable dose per unstable renal function (pharmacist dosing)   acetaminophen (TYLENOL) tablet 1,000 mg   iohexol (OMNIPAQUE) 350 MG/ML injection 100 mL   Chlorhexidine Gluconate Cloth 2 % PADS 6 each   doxercalciferol (HECTOROL) injection 4 mcg   ferric gluconate (FERRLECIT) 125 mg in sodium chloride 0.9 % 100 mL IVPB   Darbepoetin Alfa (ARANESP) injection 200 mcg   docusate sodium (COLACE) capsule 100 mg   polyethylene glycol (MIRALAX / GLYCOLAX) packet 17 g   heparin injection 5,000 Units   sodium chloride flush (NS) 0.9 % injection 10 mL   morphine 2 MG/ML injection 2 mg    -I have reviewed the patients home medicines and have made adjustments as needed  Critical interventions BiPAP, supplemental oxygen, bedside echo  Consultations Obtained: I requested consultation with the nephrologist and ICU team,  and discussed lab and imaging findings as well as pertinent plan - they recommend: Admission to the ICU   Cardiac Monitoring: The patient was maintained on a cardiac monitor.  I personally viewed and interpreted the cardiac monitored which showed an underlying rhythm of: Normal sinus rhythm  Social Determinants of Health:  Factors impacting patients care include: none   Reevaluation: After the interventions noted above, I reevaluated the patient and found that they have :improved  Co morbidities that complicate the patient evaluation  Past Medical History:  Diagnosis Date   Anasarca associated with disorder of kidney 06/08/2021   Anxiety    Asthma    Bipolar depression ([place])    Depression    Dyspnea    ESRD (end stage renal disease) on dialysis ([place]) 10/2020   GERD (gastroesophageal reflux disease)    Gonorrhea    Lupus  ([place])    Pelvic inflammatory disease (PID)    Pleural effusion 06/08/2021   Renal hypertension    Trichomonas infection       Dispostion: ICU     Final Clinical Impression(s) / ED Diagnoses Final diagnoses:  Encounter for chest tube placement     @PCDICTATION @    Teressa Lower, MD 12/13/21 1526

## 2021-12-13 NOTE — ED Provider Triage Note (Signed)
Emergency Medicine Provider Triage Evaluation Note  Cathy [name] , a 41 y.o. female  was evaluated in triage.  Patient is end-stage renal disease on dialysis Tuesday, Thursday, Saturday schedule.  Last dialysis session yesterday.  Reports since yesterday she has had worsening shortness of breath, chest pain, abdominal pain and distention, nausea.  Denies fever, or chills at home.  Reports substernal chest pain that has been constant since onset.  Patient wears 2 L at home but is currently on 4 L.  Review of Systems  Positive: As above Negative: As above  Physical Exam  BP (!) 159/112 (BP Location: Right Arm)    Pulse (!) 101    Temp (!) 103.1 F (39.5 C) (Oral)    Resp 20    SpO2 100%  Gen:   Awake, no distress   Resp:  Normal effort  MSK:   Moves extremities without difficulty  Other:  Abdomen distended and tender to palpation.  Tachycardic on exam.  Medical Decision Making  Medically screening exam initiated at 9:55 AM.  Appropriate orders placed.  [name] was informed that the remainder of the evaluation will be completed by another provider, this initial triage assessment does not replace that evaluation, and the importance of remaining in the ED until their evaluation is complete.     Evlyn Courier, PA-C 12/13/21 [phone]

## 2021-12-13 NOTE — Progress Notes (Signed)
Pharmacy Antibiotic Note  Cathy [name] is a 41 y.o. female admitted on 12/13/2021 with sepsis.  Pharmacy has been consulted for vancomycin/cefepime dosing.  ESRD on iHD TuThSa  Plan: Vancomycin 1000mg  LD x1 Follow up iHD schedule for further dosing Cefepime 1gm q24hr Plan to obtain levels at steady state if therapy continued Will monitor for acute changes in renal function and adjust as needed F/u cultures results and de-escalate as appropriate  Temp (24hrs), Avg:103.1 F (39.5 C), Min:103.1 F (39.5 C), Max:103.1 F (39.5 C)  Recent Labs  Lab 12/13/21 1005  WBC 12.3*  CREATININE 6.53*  LATICACIDVEN 1.2    CrCl cannot be calculated (Unknown ideal weight.).    No Known Allergies  Antimicrobials this admission: Vanco 1/13 >>  Cefepime 1/13 >>  Metronidazole 1/13>>  Dose adjustments this admission: nocne  Microbiology results: 1/13 BCx: IP 1/13 UCx: IP   Thank you for allowing pharmacy to be a part of this patients care.  Donnald Garre, PharmD Clinical Pharmacist  Please check AMION for all [facility] numbers After 10:00 PM, call [place] [phone]

## 2021-12-13 NOTE — ED Notes (Signed)
Pt placed on purewick. RT at bedside to place pt on biPAP. Kommer MD at bedside w/ ultrasound.

## 2021-12-13 NOTE — ED Notes (Signed)
Pt is tachypnic in 33s, SpO2 above 95% on baseline 2L O2. RN increased O2 to 3L for comfort.

## 2021-12-13 NOTE — ED Notes (Signed)
ED TO INPATIENT HANDOFF REPORT  ED Nurse Name and Phone #: Alysha Doolan RN [phone]  S Name/Age/Gender Cathy [name] 41 y.o. female Room/Bed: 001C/001C  Code Status   Code Status: Full Code  Home/SNF/Other Home Patient oriented to: self, place, time, and situation Is this baseline? Yes   Triage Complete: Triage complete  Chief Complaint Sepsis [facility]) [A41.9]  Triage Note Pt arrived by [facility] from home. Reports sob x 1 month that has increased and has right side chest pain that increases with palpation and cough. Dialysis pt, last treatment was yesterday.    Allergies No Known Allergies  Level of Care/Admitting Diagnosis ED Disposition     ED Disposition  Admit   Condition  --   Comment  Hospital Area: [facility] [[id number]]  Level of Care: ICU [6]  May admit patient to [facility] or [facility] if equivalent level of care is available:: Yes  Covid Evaluation: Confirmed COVID Negative  Diagnosis: Sepsis [facility]) [[id number]]  Admitting Physician: Lanier Clam [phone]  Attending Physician: Lanier Clam [phone]  Estimated length of stay: 5 - 7 days  Certification:: I certify this patient will need inpatient services for at least 2 midnights          B Medical/Surgery History Past Medical History:  Diagnosis Date   Anasarca associated with disorder of kidney 06/08/2021   Anxiety    Asthma    Bipolar depression ([place])    Depression    Dyspnea    ESRD (end stage renal disease) on dialysis ([place]) 10/2020   GERD (gastroesophageal reflux disease)    Gonorrhea    Lupus ([place])    Pelvic inflammatory disease (PID)    Pleural effusion 06/08/2021   Renal hypertension    Trichomonas infection    Past Surgical History:  Procedure Laterality Date   IR FLUORO GUIDE CV LINE RIGHT  11/30/2020   IR FLUORO GUIDE CV LINE RIGHT  06/13/2021   IR REMOVAL TUN CV CATH W/O FL  06/11/2021   IR US GUIDE VASC ACCESS RIGHT  11/30/2020   IR US GUIDE VASC  ACCESS RIGHT  06/13/2021   RENAL BIOPSY     TEE WITHOUT CARDIOVERSION N/A 06/13/2021   Procedure: TRANSESOPHAGEAL ECHOCARDIOGRAM (TEE);  Surgeon: Josue Hector, MD;  Location: [facility];  Service: Cardiovascular;  Laterality: N/A;   TUBAL LIGATION N/A 02/03/2019   Procedure: POST PARTUM TUBAL LIGATION;  Surgeon: Aletha Halim, MD;  Location: [facility] LD ORS;  Service: Gynecology;  Laterality: N/A;     A IV Location/Drains/Wounds Patient Lines/Drains/Airways Status     Active Line/Drains/Airways     Name Placement date Placement time Site Days   Peripheral IV 12/13/21 18 G Right Antecubital 12/13/21  1120  Antecubital  less than 1   Hemodialysis Catheter Right Internal jugular Double lumen Permanent (Tunneled) 06/13/21  0840  Internal jugular  183   Incision (Closed) 09/23/18 Flank Left 09/23/18  1211  -- 1177   Incision (Closed) 02/03/19 Abdomen 02/03/19  1452  -- 1044            Intake/Output Last 24 hours  Intake/Output Summary (Last 24 hours) at 12/13/2021 1603 Last data filed at 12/13/2021 1522 Gross per 24 hour  Intake 393.05 ml  Output --  Net 393.05 ml    Labs/Imaging Results for orders placed or performed during the hospital encounter of 12/13/21 (from the past 48 hour(s))  Resp Panel by RT-PCR (Flu A&B, Covid) Nasopharyngeal Swab  Status: None   Collection Time: 12/13/21  9:55 AM   Specimen: Nasopharyngeal Swab; Nasopharyngeal(NP) swabs in vial transport medium  Result Value Ref Range   SARS Coronavirus 2 by RT PCR NEGATIVE NEGATIVE    Comment: (NOTE) SARS-CoV-2 target nucleic acids are NOT DETECTED.  The SARS-CoV-2 RNA is generally detectable in upper respiratory specimens during the acute phase of infection. The lowest concentration of SARS-CoV-2 viral copies this assay can detect is 138 copies/mL. A negative result does not preclude SARS-Cov-2 infection and should not be used as the sole basis for treatment or other patient management decisions. A  negative result may occur with  improper specimen collection/handling, submission of specimen other than nasopharyngeal swab, presence of viral mutation(s) within the areas targeted by this assay, and inadequate number of viral copies(<138 copies/mL). A negative result must be combined with clinical observations, patient history, and epidemiological information. The expected result is Negative.  Fact Sheet for Patients:  EntrepreneurPulse.com.au  Fact Sheet for Healthcare Providers:  IncredibleEmployment.be  This test is no t yet approved or cleared by the [place] FDA and  has been authorized for detection and/or diagnosis of SARS-CoV-2 by FDA under an Emergency Use Authorization (EUA). This EUA will remain  in effect (meaning this test can be used) for the duration of the COVID-19 declaration under Section 564(b)(1) of the Act, 21 U.S.C.section 360bbb-3(b)(1), unless the authorization is terminated  or revoked sooner.       Influenza A by PCR NEGATIVE NEGATIVE   Influenza B by PCR NEGATIVE NEGATIVE    Comment: (NOTE) The Xpert Xpress SARS-CoV-2/FLU/RSV plus assay is intended as an aid in the diagnosis of influenza from Nasopharyngeal swab specimens and should not be used as a sole basis for treatment. Nasal washings and aspirates are unacceptable for Xpert Xpress SARS-CoV-2/FLU/RSV testing.  Fact Sheet for Patients: EntrepreneurPulse.com.au  Fact Sheet for Healthcare Providers: IncredibleEmployment.be  This test is not yet approved or cleared by the [place] FDA and has been authorized for detection and/or diagnosis of SARS-CoV-2 by FDA under an Emergency Use Authorization (EUA). This EUA will remain in effect (meaning this test can be used) for the duration of the COVID-19 declaration under Section 564(b)(1) of the Act, 21 U.S.C. section 360bbb-3(b)(1), unless the authorization is terminated  or revoked.  Performed at [facility], [place] [address] [place], [place] [address]   Lactic acid, plasma     Status: None   Collection Time: 12/13/21 10:05 AM  Result Value Ref Range   Lactic Acid, Venous 1.2 0.5 - 1.9 mmol/L    Comment: Performed at [facility] [address] [place], [place] [address]  CBC WITH DIFFERENTIAL     Status: Abnormal   Collection Time: 12/13/21 10:05 AM  Result Value Ref Range   WBC 12.3 (H) 4.0 - 10.5 K/uL   RBC 2.73 (L) 3.87 - 5.11 MIL/uL   Hemoglobin 7.4 (L) 12.0 - 15.0 g/dL   HCT 24.7 (L) 36.0 - 46.0 %   MCV 90.5 80.0 - 100.0 fL   MCH 27.1 26.0 - 34.0 pg   MCHC 30.0 30.0 - 36.0 g/dL   RDW 16.9 (H) 11.5 - 15.5 %   Platelets 186 150 - 400 K/uL   nRBC 0.0 0.0 - 0.2 %   Neutrophils Relative % 78 %   Neutro Abs 9.7 (H) 1.7 - 7.7 K/uL   Lymphocytes Relative 9 %   Lymphs Abs 1.1 0.7 - 4.0 K/uL   Monocytes  Relative 12 %   Monocytes Absolute 1.4 (H) 0.1 - 1.0 K/uL   Eosinophils Relative 0 %   Eosinophils Absolute 0.0 0.0 - 0.5 K/uL   Basophils Relative 0 %   Basophils Absolute 0.0 0.0 - 0.1 K/uL   Immature Granulocytes 1 %   Abs Immature Granulocytes 0.10 (H) 0.00 - 0.07 K/uL    Comment: Performed at [facility] [address] [place], [place] [address]  Protime-INR     Status: None   Collection Time: 12/13/21 10:05 AM  Result Value Ref Range   Prothrombin Time 14.8 11.4 - 15.2 seconds   INR 1.2 0.8 - 1.2    Comment: (NOTE) INR goal varies based on device and disease states. Performed at [facility], [place] [address], [place], [place] [address]   APTT     Status: None   Collection Time: 12/13/21 10:05 AM  Result Value Ref Range   aPTT 30 24 - 36 seconds    Comment: Performed at [facility] [address], [place], [place] [address]  Brain natriuretic peptide     Status: Abnormal   Collection Time: 12/13/21 10:05 AM  Result Value Ref Range   B Natriuretic Peptide >4,500.0 (H) 0.0 - 100.0 pg/mL     Comment: Performed at [facility] [address] [place], [place] [address]  Comprehensive metabolic panel     Status: Abnormal   Collection Time: 12/13/21 10:05 AM  Result Value Ref Range   Sodium 132 (L) 135 - 145 mmol/L   Potassium 5.0 3.5 - 5.1 mmol/L   Chloride 92 (L) 98 - 111 mmol/L   CO2 26 22 - 32 mmol/L   Glucose, Bld 97 70 - 99 mg/dL    Comment: Glucose reference range applies only to samples taken after fasting for at least 8 hours.   BUN 23 (H) 6 - 20 mg/dL   Creatinine, Ser 6.53 (H) 0.44 - 1.00 mg/dL   Calcium 8.8 (L) 8.9 - 10.3 mg/dL   Total Protein 6.6 6.5 - 8.1 g/dL   Albumin 3.1 (L) 3.5 - 5.0 g/dL   AST 76 (H) 15 - 41 U/L   ALT 40 0 - 44 U/L   Alkaline Phosphatase 341 (H) 38 - 126 U/L   Total Bilirubin 1.0 0.3 - 1.2 mg/dL   GFR, Estimated 8 (L) >60 mL/min    Comment: (NOTE) Calculated using the CKD-EPI Creatinine Equation (2021)    Anion gap 14 5 - 15    Comment: Performed at [facility], [place] [address] [place], [place] [address]  Troponin I (High Sensitivity)     Status: Abnormal   Collection Time: 12/13/21 10:05 AM  Result Value Ref Range   Troponin I (High Sensitivity) 88 (H) <18 ng/L    Comment: (NOTE) Elevated high sensitivity troponin I (hsTnI) values and significant  changes across serial measurements may suggest ACS but many other  chronic and acute conditions are known to elevate hsTnI results.  Refer to the "Links" section for chest pain algorithms and additional  guidance. Performed at [facility], [place] [address], [place],  [address]   I-Stat beta hCG blood, ED     Status: None   Collection Time: 12/13/21 11:35 AM  Result Value Ref Range   I-stat hCG, quantitative <5.0 <5 mIU/mL   Comment 3            Comment:   GEST. AGE  CONC.  (mIU/mL)   <=1 WEEK        5 - 50     2 WEEKS       50 - 500     3 WEEKS       100 - 10,000     4 WEEKS     1,000 - 30,000        FEMALE AND NON-PREGNANT FEMALE:     LESS THAN 5  mIU/mL   I-Stat venous blood gas, ED     Status: Abnormal   Collection Time: 12/13/21 11:36 AM  Result Value Ref Range   pH, Ven 7.504 (H) 7.250 - 7.430   pCO2, Ven 38.4 (L) 44.0 - 60.0 mmHg   pO2, Ven 52.0 (H) 32.0 - 45.0 mmHg   Bicarbonate 30.2 (H) 20.0 - 28.0 mmol/L   TCO2 31 22 - 32 mmol/L   O2 Saturation 90.0 %   Acid-Base Excess 7.0 (H) 0.0 - 2.0 mmol/L   Sodium 131 (L) 135 - 145 mmol/L   Potassium 4.9 3.5 - 5.1 mmol/L   Calcium, Ion 1.01 (L) 1.15 - 1.40 mmol/L   HCT 25.0 (L) 36.0 - 46.0 %   Hemoglobin 8.5 (L) 12.0 - 15.0 g/dL   Sample type VENOUS   Lactic acid, plasma     Status: None   Collection Time: 12/13/21 12:14 PM  Result Value Ref Range   Lactic Acid, Venous 1.5 0.5 - 1.9 mmol/L    Comment: Performed at [facility] [address] [place], [place] [address]  Troponin I (High Sensitivity)     Status: Abnormal   Collection Time: 12/13/21 12:14 PM  Result Value Ref Range   Troponin I (High Sensitivity) 95 (H) <18 ng/L    Comment: (NOTE) Elevated high sensitivity troponin I (hsTnI) values and significant  changes across serial measurements may suggest ACS but many other  chronic and acute conditions are known to elevate hsTnI results.  Refer to the "Links" section for chest pain algorithms and additional  guidance. Performed at [facility], [place] [address], [place], [place] [address]    CT Angio Chest PE W and/or Wo Contrast  Result Date: 12/13/2021 CLINICAL DATA:  Shortness of breath, chest pain, epigastric pain EXAM: CT ANGIOGRAPHY CHEST WITH CONTRAST TECHNIQUE: Multidetector CT imaging of the chest was performed using the standard protocol during bolus administration of intravenous contrast. Multiplanar CT image reconstructions and MIPs were obtained to evaluate the vascular anatomy. RADIATION DOSE REDUCTION: This exam was performed according to the departmental dose-optimization program which includes automated exposure control, adjustment of the mA  and/or kV according to patient size and/or use of iterative reconstruction technique. CONTRAST:  154mL OMNIPAQUE IOHEXOL 350 MG/ML SOLN COMPARISON:  02/01/2019 FINDINGS: Cardiovascular: Heart is enlarged in size. There is moderate to large pericardial effusion. There is no contrast enhancement in thoracic aorta limiting evaluation of the lumen of thoracic aorta. There are no intraluminal filling defects in central pulmonary artery branches. Evaluation of small subsegmental peripheral branches in the lower lung fields is limited by infiltrates. There is ectasia of main pulmonary artery measuring 4 cm. There is no evidence of right ventricular strain. Mediastinum/Nodes: No significant lymphadenopathy seen. Lungs/Pleura: There is moderate right pleural effusion. There is small left pleural effusion. Large patchy alveolar infiltrate is seen in right lower lobe. There are smaller patchy infiltrates in right middle lobe and left lower lobe. Upper Abdomen: Unremarkable. Musculoskeletal: Unremarkable. Review of the MIP images confirms the above  findings. IMPRESSION: There is no evidence of central pulmonary artery embolism. Evaluation of small peripheral subsegmental branches in the lower lobes is limited by infiltrates. There is ectasia of main pulmonary artery suggesting pulmonary arterial hypertension. There is large patchy alveolar infiltrate in the right lower lobe. Small patchy alveolar infiltrates are seen in the right middle lobe and left lower lobe. Findings suggest possible multifocal pneumonia. Moderate right pleural effusion. Small left pleural effusion. Moderate to large pericardial effusion. Electronically Signed   By: Elmer Picker M.D.   On: 12/13/2021 13:44   CT ABDOMEN PELVIS W CONTRAST  Result Date: 12/13/2021 CLINICAL DATA:  Abdominal pain.  Patient unable to lie flat EXAM: CT ABDOMEN AND PELVIS WITH CONTRAST TECHNIQUE: Multidetector CT imaging of the abdomen and pelvis was performed using the  standard protocol following bolus administration of intravenous contrast. RADIATION DOSE REDUCTION: This exam was performed according to the departmental dose-optimization program which includes automated exposure control, adjustment of the mA and/or kV according to patient size and/or use of iterative reconstruction technique. CONTRAST:  162mL OMNIPAQUE IOHEXOL 350 MG/ML SOLN COMPARISON:  Chest CT same day FINDINGS: Lower chest: RIGHT lobe pneumonia. Large RIGHT effusion with passive atelectasis in the RIGHT lower lobe. Cardiomegaly Hepatobiliary: No enhancing hepatic lesion. Portal veins are patent. There is periportal edema. Small fluid around the liver. Gallbladder normal. Pancreas: small cyst in the pancreas measures 6 mm (image 47/3). No worrisome characteristics. No communication with the pancreatic duct. No pancreatic duct dilatation. Spleen: Normal spleen Adrenals/urinary tract: Adrenal glands normal. Four enhancement of the renal parenchyma. No hydronephrosis. Ureters and bladder grossly normal. Bladder collapsed Stomach/Bowel: Stomach, small bowel normal. Colon rectosigmoid colon normal. Vascular/Lymphatic: Abdominal aorta is normal caliber. There is no retroperitoneal or periportal lymphadenopathy. No pelvic lymphadenopathy. Reproductive: Uterus and adnexa unremarkable. Other: Normal moderate volume of intraperitoneal free fluid there is prominent pelvis. Mild anasarca soft tissues Musculoskeletal: No aggressive osseous lesion. IMPRESSION: 1. RIGHT lower lobe pneumonia. 2. Moderate volume intraperitoneal free fluid, anasarca and pleural effusions suggest volume overload/congestive heart failure. 3. Poor renal enhancement consistent with renal insufficiency. 4. Small benign appearing cyst in the pancreas. Per consensus recommendation, consider follow-up MRI in 1 year. This recommendation follows ACR consensus guidelines: Management of Incidental Pancreatic Cysts: A White Paper of the ACR Incidental  Findings Committee. [place] 7253;[id number]. Electronically Signed   By: Suzy Bouchard M.D.   On: 12/13/2021 13:51   DG Chest Port 1 View  Result Date: 12/13/2021 CLINICAL DATA:  Questionable sepsis.  Evaluate for abnormality. EXAM: PORTABLE CHEST 1 VIEW COMPARISON:  06/15/2021 FINDINGS: There is a right jugular dialysis catheter with the tip extending into the right atrium. Bibasilar chest densities are compatible with pleural effusions and basilar consolidation or airspace disease. Pleural effusions are likely moderate in size particularly on the right side. Negative for pneumothorax. Cardiac silhouette appears to be enlarged and stable from the previous examination. IMPRESSION: New or enlarged bibasilar chest densities. Findings are suggestive for combination of pleural effusions with consolidation/airspace disease. Underlying pneumonia cannot be excluded. Dialysis catheter as described. Electronically Signed   By: Markus Daft M.D.   On: 12/13/2021 11:37    Pending Labs Unresulted Labs (From admission, onward)     Start     Ordered   12/14/21 0500  CBC  Tomorrow morning,   R        12/13/21 1512   12/13/21 1510  Renal function panel  Once,   R  12/13/21 1512   12/13/21 1510  MRSA Next Gen by PCR, Nasal  Once,   R        12/13/21 1512   12/13/21 1509  Pleural fluid culture w Gram Stain  (Bedside Thoracentesis)  Once,   R       Question Answer Comment  Are there also cytology or pathology orders on this specimen? No   Patient immune status Normal      12/13/21 1508   12/13/21 1509  Pleural Fluid cell count with differential  (Bedside Thoracentesis)  Once,   R       Question:  Are there also cytology or pathology orders on this specimen?  Answer:  No   12/13/21 1508   12/13/21 1509  Protein, Pleural Fluid  (Bedside Thoracentesis)  Once,   R        12/13/21 1508   12/13/21 1509  Lactate dehydrogenase, Pleural Fluid  (Bedside Thoracentesis)  Once,   R        12/13/21 1508    12/13/21 1509  Protein, total  (Bedside Thoracentesis)  Once,   R        12/13/21 1508   12/13/21 1509  Lactate dehydrogenase  (Bedside Thoracentesis)  Once,   R        12/13/21 1508   12/13/21 1459  HIV Antibody (routine testing w rflx)  (HIV Antibody (Routine testing w reflex) panel)  Once,   R        12/13/21 1502   12/13/21 1103  Blood gas, venous (at [facility] and [facility], not at [facility])  ONCE - STAT,   STAT        12/13/21 1103   12/13/21 0955  Blood Culture (routine x 2)  (Septic presentation on arrival (screening labs, nursing and treatment orders for obvious sepsis))  BLOOD CULTURE X 2,   STAT      12/13/21 0955   12/13/21 0955  Urinalysis, Routine w reflex microscopic  (Septic presentation on arrival (screening labs, nursing and treatment orders for obvious sepsis))  ONCE - STAT,   STAT        12/13/21 0955   12/13/21 0955  Urine Culture  (Septic presentation on arrival (screening labs, nursing and treatment orders for obvious sepsis))  ONCE - STAT,   STAT       Question:  Indication  Answer:  Sepsis   12/13/21 0955   Signed and Held  Hepatitis B surface antigen  (New Admission [facility] (Hepatitis B))  Once,   R        Signed and Held   Signed and Held  Hepatitis B surface antibody  (New Admission [facility] (Hepatitis B))  Once,   R        Signed and Held   Signed and Held  Hepatitis B surface antibody,quantitative  (New Admission [facility] (Hepatitis B))  Once,   R        Signed and Held            Vitals/Pain Today's Vitals   12/13/21 1430 12/13/21 1445 12/13/21 1500 12/13/21 1515  BP: (!) 148/114 (!) 146/110 (!) 123/100 (!) 148/112  Pulse: 92 93 85 84  Resp: (!) 28 (!) 21 20 (!) 21  Temp:      TempSrc:      SpO2: 100% 100% 100% 100%  PainSc:        Isolation Precautions No active isolations  Medications Medications  metroNIDAZOLE (FLAGYL) IVPB 500 mg (0  mg Intravenous Stopped 12/13/21 1308)  ceFEPIme (MAXIPIME) 1 g in sodium chloride 0.9 % 100 mL IVPB (has no  administration in time range)  vancomycin variable dose per unstable renal function (pharmacist dosing) (has no administration in time range)  Chlorhexidine Gluconate Cloth 2 % PADS 6 each (has no administration in time range)  doxercalciferol (HECTOROL) injection 4 mcg (has no administration in time range)  ferric gluconate (FERRLECIT) 125 mg in sodium chloride 0.9 % 100 mL IVPB (has no administration in time range)  Darbepoetin Alfa (ARANESP) injection 200 mcg (has no administration in time range)  docusate sodium (COLACE) capsule 100 mg (has no administration in time range)  polyethylene glycol (MIRALAX / GLYCOLAX) packet 17 g (has no administration in time range)  heparin injection 5,000 Units (has no administration in time range)  sodium chloride flush (NS) 0.9 % injection 10 mL (has no administration in time range)  morphine 2 MG/ML injection 2 mg (2 mg Intravenous Given 12/13/21 1528)  morphine 4 MG/ML injection 3 mg (3 mg Intravenous Given 12/13/21 1133)  ondansetron (ZOFRAN) injection 4 mg (4 mg Intravenous Given 12/13/21 1131)  vancomycin (VANCOCIN) IVPB 1000 mg/200 mL premix (0 mg Intravenous Stopped 12/13/21 1450)  ceFEPIme (MAXIPIME) 2 g in sodium chloride 0.9 % 100 mL IVPB (0 g Intravenous Stopped 12/13/21 1206)  acetaminophen (TYLENOL) tablet 1,000 mg (1,000 mg Oral Given 12/13/21 1343)  iohexol (OMNIPAQUE) 350 MG/ML injection 100 mL (100 mLs Intravenous Contrast Given 12/13/21 1326)    Mobility walks High fall risk    R Recommendations: See Admitting Provider Note  Report given to: NATE  DAWKINS RN

## 2021-12-14 ENCOUNTER — Inpatient Hospital Stay (HOSPITAL_COMMUNITY): Payer: Medicare Other

## 2021-12-14 DIAGNOSIS — J90 Pleural effusion, not elsewhere classified: Secondary | ICD-10-CM

## 2021-12-14 LAB — RENAL FUNCTION PANEL
Albumin: 2.6 g/dL — ABNORMAL LOW (ref 3.5–5.0)
Albumin: 2.9 g/dL — ABNORMAL LOW (ref 3.5–5.0)
Anion gap: 18 — ABNORMAL HIGH (ref 5–15)
Anion gap: 16 — ABNORMAL HIGH (ref 5–15)
BUN: 41 mg/dL — ABNORMAL HIGH (ref 6–20)
BUN: 21 mg/dL — ABNORMAL HIGH (ref 6–20)
CO2: 26 mmol/L (ref 22–32)
CO2: 23 mmol/L (ref 22–32)
Calcium: 8.6 mg/dL — ABNORMAL LOW (ref 8.9–10.3)
Calcium: 8.9 mg/dL (ref 8.9–10.3)
Chloride: 89 mmol/L — ABNORMAL LOW (ref 98–111)
Chloride: 95 mmol/L — ABNORMAL LOW (ref 98–111)
Creatinine, Ser: 8.18 mg/dL — ABNORMAL HIGH (ref 0.44–1.00)
Creatinine, Ser: 5.07 mg/dL — ABNORMAL HIGH (ref 0.44–1.00)
GFR, Estimated: 6 mL/min — ABNORMAL LOW (ref >60)
GFR, Estimated: 11 mL/min — ABNORMAL LOW (ref >60)
Glucose, Bld: 102 mg/dL — ABNORMAL HIGH (ref 70–99)
Glucose, Bld: 94 mg/dL (ref 70–99)
Phosphorus: 7.3 mg/dL — ABNORMAL HIGH (ref 2.5–4.6)
Phosphorus: 5.5 mg/dL — ABNORMAL HIGH (ref 2.5–4.6)
Potassium: 5.9 mmol/L — ABNORMAL HIGH (ref 3.5–5.1)
Potassium: 4.6 mmol/L (ref 3.5–5.1)
Sodium: 133 mmol/L — ABNORMAL LOW (ref 135–145)
Sodium: 134 mmol/L — ABNORMAL LOW (ref 135–145)

## 2021-12-14 LAB — CBC
HCT: 25 % — ABNORMAL LOW (ref 36.0–46.0)
HCT: 28.5 % — ABNORMAL LOW (ref 36.0–46.0)
Hemoglobin: 7.8 g/dL — ABNORMAL LOW (ref 12.0–15.0)
Hemoglobin: 8.7 g/dL — ABNORMAL LOW (ref 12.0–15.0)
MCH: 27.5 pg (ref 26.0–34.0)
MCH: 27.1 pg (ref 26.0–34.0)
MCHC: 31.2 g/dL (ref 30.0–36.0)
MCHC: 30.5 g/dL (ref 30.0–36.0)
MCV: 88 fL (ref 80.0–100.0)
MCV: 88.8 fL (ref 80.0–100.0)
Platelets: 180 10*3/uL (ref 150–400)
Platelets: 219 10*3/uL (ref 150–400)
RBC: 2.84 MIL/uL — ABNORMAL LOW (ref 3.87–5.11)
RBC: 3.21 MIL/uL — ABNORMAL LOW (ref 3.87–5.11)
RDW: 17 % — ABNORMAL HIGH (ref 11.5–15.5)
RDW: 17 % — ABNORMAL HIGH (ref 11.5–15.5)
WBC: 25.2 10*3/uL — ABNORMAL HIGH (ref 4.0–10.5)
WBC: 30.3 10*3/uL — ABNORMAL HIGH (ref 4.0–10.5)
nRBC: 0 % (ref 0.0–0.2)
nRBC: 0.1 % (ref 0.0–0.2)

## 2021-12-14 LAB — GLUCOSE, CAPILLARY
Glucose-Capillary: 89 mg/dL (ref 70–99)
Glucose-Capillary: 73 mg/dL (ref 70–99)
Glucose-Capillary: 89 mg/dL (ref 70–99)
Glucose-Capillary: 118 mg/dL — ABNORMAL HIGH (ref 70–99)

## 2021-12-14 LAB — HEPATITIS B SURFACE ANTIBODY,QUALITATIVE: Hep B S Ab: REACTIVE — AB

## 2021-12-14 LAB — HEPATITIS B SURFACE ANTIGEN: Hepatitis B Surface Ag: NONREACTIVE

## 2021-12-14 MED ORDER — HYDROCERIN EX CREA
TOPICAL_CREAM | Freq: Two times a day (BID) | CUTANEOUS | Status: DC
  Administered 2021-12-14: 1 via TOPICAL
  Filled 2021-12-14: qty 113

## 2021-12-14 MED ORDER — METHOCARBAMOL 500 MG PO TABS
500.0000 mg | ORAL_TABLET | Freq: Four times a day (QID) | ORAL | Status: DC | PRN
  Administered 2021-12-14: 500 mg via ORAL
  Filled 2021-12-14: qty 1

## 2021-12-14 MED ORDER — ACETAMINOPHEN 325 MG PO TABS
650.0000 mg | ORAL_TABLET | Freq: Four times a day (QID) | ORAL | Status: DC | PRN
  Administered 2021-12-15 – 2021-12-16 (×2): 650 mg via ORAL
  Filled 2021-12-14 (×2): qty 2

## 2021-12-14 MED ORDER — HEPARIN SODIUM (PORCINE) 1000 UNIT/ML IJ SOLN
INTRAMUSCULAR | Status: AC
  Filled 2021-12-14: qty 4

## 2021-12-14 MED ORDER — HYDROMORPHONE HCL 2 MG PO TABS
2.0000 mg | ORAL_TABLET | Freq: Four times a day (QID) | ORAL | Status: DC | PRN
  Administered 2021-12-14 – 2021-12-17 (×4): 2 mg via ORAL
  Filled 2021-12-14 (×5): qty 1

## 2021-12-14 MED ORDER — VANCOMYCIN HCL 500 MG/100ML IV SOLN
500.0000 mg | Freq: Once | INTRAVENOUS | Status: AC
  Administered 2021-12-14: 500 mg via INTRAVENOUS
  Filled 2021-12-14: qty 100

## 2021-12-14 MED ORDER — SCOPOLAMINE 1 MG/3DAYS TD PT72
1.0000 | MEDICATED_PATCH | TRANSDERMAL | Status: AC
  Administered 2021-12-14: 1.5 mg via TRANSDERMAL
  Filled 2021-12-14: qty 1

## 2021-12-14 MED ORDER — WHITE PETROLATUM EX OINT
TOPICAL_OINTMENT | CUTANEOUS | Status: DC | PRN
  Filled 2021-12-14: qty 28.35

## 2021-12-14 NOTE — Progress Notes (Signed)
Pharmacy Antibiotic Note  Cathy [name] is a 41 y.o. female admitted on 12/13/2021 with sepsis.  Pharmacy has been consulted for vancomycin/cefepime dosing. Patient is ESRD on HD TTS. iHD done early this morning. Vancomycin loading dose given on 1/13.    Plan: Vancomycin 500mg  x1  Follow up iHD schedule for further dosing Continue cefepime 1gm q24hr F/u cultures results and de-escalate as appropriate  Temp (24hrs), Avg:99.3 F (37.4 C), Min:98 F (36.7 C), Max:103.1 F (39.5 C)  Recent Labs  Lab 12/13/21 1005 12/13/21 1214 12/14/21 0258  WBC 12.3*  --  25.2*  CREATININE 6.53*  --  8.18*  LATICACIDVEN 1.2 1.5  --      Estimated Creatinine Clearance: 7.8 mL/min (A) (by C-G formula based on SCr of 8.18 mg/dL (H)).    No Known Allergies  Antimicrobials this admission: Vancomycin 1/13 >>  Cefepime 1/13 >>  Metronidazole 1/13 x1   Dose adjustments this admission:  Microbiology results: 1/13 BCx:  1/13 UCx:  1/13 pleural: no organisms  MRSA PCR positive   Thank you for allowing pharmacy to be a part of this patients care.  Cristela Felt, PharmD, BCPS Clinical Pharmacist 12/14/2021 7:45 AM

## 2021-12-14 NOTE — Progress Notes (Signed)
[facility] Progress Note Patient Name: Cathy [name] DOB: [birth year] MRN: [id number]   Date of Service  12/14/2021  HPI/Events of Note  Patient c/o pain felt to be d/t muscle spasm.   eICU Interventions  Plan: Robaxin 500 mg PO Q 6 hour PRN pain/spasm.     Intervention Category Major Interventions: Other:  Lysle Dingwall 12/14/2021, 8:11 PM

## 2021-12-14 NOTE — Progress Notes (Signed)
Patient in no distress at this time. Bipap on standby, will continue to monitor patient.

## 2021-12-14 NOTE — Progress Notes (Signed)
[facility] Progress Note  Subjective: seen in room, watching shows on her phone  Vitals:   12/14/21 1100 12/14/21 1121 12/14/21 1441 12/14/21 1503  BP: (!) 128/95  (!) 124/93   Pulse: 75     Resp: (!) 21     Temp:  98 F (36.7 C)  99.5 F (37.5 C)  TempSrc:  Oral  Oral  SpO2: 96%     Weight:      Height:        Exam: Exam  alert, nad , chronically ill appearing  no jvd  Chest basilar crackles bilat, R chest tube in place  Cor reg no RG  Abd soft ntnd no ascites   Ext trace remaining pretib edema bilat   Alert, NF, ox3, a little groggy   RIJ TDC    Home meds include - coreg 25 bid, atrovent, avapro 300, bidil 20-37.5 tid, protonix, demadex 60 bid, prosource plus liquid, prns/ vits / supps      OP HD: [place] TTS  4h   2/2 bath  56kg  Hep none   R TDC  - hect 5 ug tiw  - mircera 225 last 12/22  - venofer 100mg  , 6 more doses    Na 132  BUN 23  Cr 6.5    alb 3.1  BNP >4500 wBC 12  Hb 7.4  COVID neg   BP 170/ 115, 3L 99%  RR 26- 40 COVID neg     Assessment/ Plan: SOB/ acute hypoxic resp failure/ fever / RLL PNA + effusion - looks better, on IV abx, sp R chest tube, sp HD w/ 4 L UF.  ESRD - usual HD TTS.  Had HD early am today, does not need more HD today. Next HD Tuesday, or possibly Monday if needs early Rx.  SLE - not on any medications at this time HTN - cont home meds, vol removal w/ HD Anemia ckd - Hold IV Fe w/ infection. Hb ~ 8, resumed high dose esa here w/ darbe 200 ug sq weekly.  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 phos in range 5.5, not sure binder. Cont hectorol.             Rob Doctor, hospital  MD 12/14/2021, 3:04 PM    Rob Merideth Bosque 12/14/2021, 3:13 PM   Recent Labs  Lab 12/14/21 0258 12/14/21 0913  K 5.9* 4.6  BUN 41* 21*  CREATININE 8.18* 5.07*  CALCIUM 8.6* 8.9  PHOS 7.3* 5.5*  HGB 7.8* 8.7*   Inpatient medications:  carvedilol  12.5 mg Oral BID WC   Chlorhexidine Gluconate Cloth  6 each Topical Q0600   darbepoetin (ARANESP)  injection - NON-DIALYSIS  200 mcg Subcutaneous Q Fri-1800   doxercalciferol  4 mcg Intravenous Q T,Th,Sa-HD   heparin  5,000 Units Subcutaneous Q8H   hydrALAZINE  25 mg Oral Q8H   hydrocerin   Topical BID   mouth rinse  15 mL Mouth Rinse BID   mupirocin ointment  1 application Nasal BID   scopolamine  1 patch Transdermal Q72H   sodium chloride flush  10 mL Other Q8H   sodium chloride flush  10-40 mL Intracatheter Q12H   vancomycin variable dose per unstable renal function (pharmacist dosing)   Does not apply See admin instructions    ceFEPime (MAXIPIME) IV     acetaminophen, docusate sodium, hydrALAZINE, HYDROmorphone, morphine injection, polyethylene glycol, sodium chloride flush

## 2021-12-14 NOTE — Progress Notes (Signed)
[facility] Progress Note Patient Name: Cathy [name] DOB: 05/12/1981 MRN: [id number]   Date of Service  12/14/2021  HPI/Events of Note  Patient is nauseated, QTC is 0.500.  eICU Interventions  Scopolamine patch x 1 ordered.        Kerry Kass Cathy [name] 12/14/2021, 2:51 AM

## 2021-12-14 NOTE — Progress Notes (Addendum)
NAME:  Cathy [name], MRN:  [id number], DOB:  [birth year], LOS: 1 ADMISSION DATE:  12/13/2021, CONSULTATION DATE: 12/13/2021 REFERRING MD: Dr. Matilde Sprang, CHIEF COMPLAINT: Sepsis  History of Present Illness:  HPI obtained mostly from chart review as patient is able to give limited information, on biPAP and poor historian.     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  Unclear when fever started or if productive cough.  Last iHD on 1/12 with full treatment.     In ER, febrile up to 103, hypertensive, tachycardic, tachpneic, and sats 98% on baseline 2L.  Labs noted for WBC 12K, Hgb 7.4, Na 132, sCr 6.53, Alk phos 341, albumin 3.1, AST 76, trop hs 88 > 95, and BNP > 4.5K (has been elevated > 4.5K since 2021), normal coags.  Venous/ mixed gas 7.5/ 38/ 52/ 30.  CXR noted for bibasilar densities, R pleural effusion > L, and with stable but enlarged cardiac silhouette.  Underwent CTA PE which was negative for PE but noted ectasia of main pulmonary artery suggestive of PAH and large patchy infiltrate of RLL and small patchy infiltrate in RLL, with moderate R pleural effusion, small L pleural effusion, and moderate to large pericardial effusion.  CT abd/ pelvis noted for moderate volume intraperitoneal free fluid, and small benign appearing cyst in pancreas.  She was placed on BiPAP to help with work of breathing.  Cultures sent and empirically started on vancomycin and cefepime.  Nephrology consulted in ER with plans for iHD later this evening.  PCCM called for admit.  Pertinent  Medical History  Systolic and diastolic HF (2/87 EF 68-11%), HTN, ESRD TTS iHD 2/2 SLE (on iHD for ~1 yr), bipolar, anxiety/ depression  - MSSA  bacteremia 2/2 infected line in 05/2021  Significant Hospital Events: Including procedures, antibiotic start and stop dates in addition to other pertinent events   1/13 admitted, RML/ RLL PNA, sepsis, complicated R pleural effusion, acute on chronic HF 1/13 cefepime / vanc  1/13 Bcx2 >  1/13 SARS/ flu  > neg 1/13 MRSA PCR > Pos 1/13 R pleural fluid >   Interim History / Subjective:  Patient on room air this morning Reports she slept well overnight Pain is well controlled  Objective   Blood pressure (!) 128/95, pulse 75, temperature 98 F (36.7 C), temperature source Oral, resp. rate (!) 21, height 5' 7" (1.702 m), weight 53.2 kg, SpO2 96 %, unknown if currently breastfeeding.    FiO2 (%):  [30 %] 30 %   Intake/Output Summary (Last 24 hours) at 12/14/2021 1228 Last data filed at 12/14/2021 0657 Gross per 24 hour  Intake 333.01 ml  Output 4150 ml  Net -3816.99 ml   Filed Weights   12/13/21 2000 12/14/21 0300 12/14/21 0615  Weight: 57.1 kg 57 kg 53.2 kg    Examination: General: Chronically ill-appearing middle-aged female.  Appears older than stated age.  NAD. HENT: Dry mucous membrane. Lungs: Diminished lung sounds.  No wheezing or rales. Cardiovascular: RRR.  No murmurs rubs or gallops. Abdomen: Soft.  NT/ND.  Normal BS Extremities: Well perfused.  Trace BLE edema Skin: Warm and dry.  Lupus skin changes Neuro: Alert and oriented x3.  Moves all  Resolved Hospital Problem list   °Acute hypoxic respiratory failure °Sepsis °Assessment & Plan:  °Acute on chronic hypoxic respiratory failure °Sepsis, resolved °Mental status back to baseline. On 2 L [place] at home.  Received BiPAP therapy yesterday.  Patient back to home 2 L [place]. °--Continue O2 supplement  °--Okay to transfer to floor ° °Pneumonia with pneumonic effusion °Pneumonia °Chest tube in place with about 200 cc of light brown fluid out.  White count continues to trend up.  Pleural fluid analysis shows likely exudative  effusion.  Getting pleural albumin to further delineate.  MRSA screen positive.  °--Continue empirical antibiotics with Vanc and cefepime °--Follow-up pleural fluid culture and bcx °--Sputum culture °--PCCM will follow for chest tube management °--Start dilaudid 2 mg q6hr prn for moderate pain ° °ESRD on HD (TueThSat) °Hyperkalemia °Receive HD last night with 4L of UF off. Hyperkalemia resolved and kidney function improved. °--Nephro following, appreciate recs °--No plan for HD today, next HD on Monday °--Avoid nephrotoxic's agent °--Strict I&O's ° °SLE °Chronic and stable. Not on any medications at this time. ° °HTN °BP stable with SBP in the 110s to 130s. °--Continue PRN hydralazine °--Slowly resume home meds ° °Anemia of chronic disease °Hemoglobin stable at 8.7.  Continue to hold IV iron in the setting of infection. °-CTM ° °HFpEF °Chronic pericardial effusion °Repeat echo showed EF 25-30%, LV global hypokinesis, mild LVH, G2 DD, severely dilated LA, moderately dilated RA and moderate pericardial effusion.  No cardiac tamponade.  Shortness of breath and lower extremity swelling improved after HD.  °--Strict I&O's, daily weight °--Continue Coreg 12.5 mg twice daily ° °Best Practice (right click and "Reselect all SmartList Selections" daily)  ° °Diet/type: Regular consistency (see orders) °DVT prophylaxis: prophylactic heparin  °GI prophylaxis: N/A °Lines: N/A °Foley:  N/A °Code Status:  full code °Last date of multidisciplinary goals of care discussion [no family at bedside] ° °Labs   °CBC: °Recent Labs  °Lab 12/13/21 °1005 12/13/21 °1136 12/14/21 °0258 12/14/21 °0913  °WBC 12.3*  --  25.2* 30.3*  °NEUTROABS 9.7*  --   --   --   °HGB 7.4* 8.5* 7.8* 8.7*  °HCT 24.7* 25.0* 25.0* 28.5*  °MCV 90.5  --  88.0 88.8  °PLT 186  --  180 219  ° ° °Basic Metabolic Panel: °Recent Labs  °Lab 12/13/21 °1005 12/13/21 °1136 12/14/21 °0258 12/14/21 °0913  °NA 132* 131* 133* 134*  °K 5.0 4.9 5.9* 4.6  °CL 92*  --  89* 95*  °CO2 26   --  26 23  °GLUCOSE 97  --  102* 94  °BUN 23*  --  41* 21*  °CREATININE 6.53*  --  8.18* 5.07*  °CALCIUM 8.8*  --  8.6* 8.9  °PHOS  --   --  7.3* 5.5*  ° °GFR: °Estimated Creatinine Clearance: 12.6 mL/min (A) (by C-G formula based on SCr of 5.07 mg/dL (H)). °Recent Labs  °Lab 12/13/21 °1005 12/13/21 °1214 12/14/21 °0258 12/14/21 °0913  °WBC 12.3*  --  25.2* 30.3*  °LATICACIDVEN 1.2 1.5  --   --   ° ° °Liver Function Tests: °Recent Labs  °Lab 12/13/21 °1005 12/13/21 °1530 12/14/21 °0258 12/14/21 °0913  °AST 76*  --   --   --   °ALT 40  --   --   --   °ALKPHOS 341*  --   --   --   °BILITOT 1.0  --   --   --   °PROT 6.6 6.8  --   --   °  ALBUMIN 3.1*  --  2.6* 2.9*   No results for input(s): LIPASE, AMYLASE in the last 168 hours. No results for input(s): AMMONIA in the last 168 hours.  ABG    Component Value Date/Time   PHART 7.335 (L) 01/18/2021 2111   PCO2ART 45.7 01/18/2021 2111   PO2ART 150 (H) 01/18/2021 2111   HCO3 30.2 (H) 12/13/2021 1136   TCO2 31 12/13/2021 1136   ACIDBASEDEF 1.0 01/18/2021 2111   O2SAT 90.0 12/13/2021 1136     Coagulation Profile: Recent Labs  Lab 12/13/21 1005  INR 1.2    Cardiac Enzymes: No results for input(s): CKTOTAL, CKMB, CKMBINDEX, TROPONINI in the last 168 hours.  HbA1C: No results found for: HGBA1C  CBG: Recent Labs  Lab 12/13/21 1938 12/13/21 2319 12/14/21 0340 12/14/21 0708 12/14/21 1110  GLUCAP 90 86 89 73 89    Critical care time: 30

## 2021-12-15 ENCOUNTER — Inpatient Hospital Stay (HOSPITAL_COMMUNITY): Payer: Medicare Other

## 2021-12-15 DIAGNOSIS — J9621 Acute and chronic respiratory failure with hypoxia: Secondary | ICD-10-CM | POA: Diagnosis present

## 2021-12-15 DIAGNOSIS — I3139 Other pericardial effusion (noninflammatory): Secondary | ICD-10-CM

## 2021-12-15 DIAGNOSIS — J189 Pneumonia, unspecified organism: Secondary | ICD-10-CM | POA: Diagnosis present

## 2021-12-15 LAB — HEPATITIS B SURFACE ANTIBODY, QUANTITATIVE: Hep B S AB Quant (Post): 51.1 m[IU]/mL (ref >9.9)

## 2021-12-15 MED ORDER — HYDROXYZINE HCL 25 MG PO TABS
25.0000 mg | ORAL_TABLET | Freq: Three times a day (TID) | ORAL | Status: DC | PRN
  Administered 2021-12-15 – 2021-12-17 (×3): 25 mg via ORAL
  Filled 2021-12-15 (×3): qty 1

## 2021-12-15 MED ORDER — DIPHENHYDRAMINE HCL 50 MG/ML IJ SOLN
12.5000 mg | Freq: Four times a day (QID) | INTRAMUSCULAR | Status: DC | PRN
  Administered 2021-12-15 – 2021-12-16 (×3): 12.5 mg via INTRAVENOUS
  Filled 2021-12-15 (×3): qty 1

## 2021-12-15 MED ORDER — HYDROXYZINE HCL 25 MG PO TABS
25.0000 mg | ORAL_TABLET | Freq: Once | ORAL | Status: AC
  Administered 2021-12-15: 25 mg via ORAL
  Filled 2021-12-15: qty 1

## 2021-12-15 NOTE — Progress Notes (Signed)
[facility]                                                                              Patient Demographics  Cathy [name], is a 41 y.o. 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  In ED, patient was febrile up to 103 F, hypertensive, tachycardic, tachypneic, O2 sats 98% on 2 L.  WBCs 12 K, hemoglobin 7.4.  Elevated alk phos 341.  BNP > 4.5k.  Chest x-ray showed bibasilar densities with right pleural effusion> left and cardiomegaly. CTA chest negative for PE, showed large fatty infiltrate of RLL, moderate right-sided pleural effusion, small left pleural effusion, moderate to large pericardial effusion, PAH. Patient was placed on BiPAP, IV antibiotics Underwent chest tube placement on 12/13/21, CCM following for chest tube  Patient transferred to [facility], assumed care on 1/15   Assessment & Plan    Principal Problem:   Acute on chronic respiratory failure with hypoxia ([place])  -Secondary to bilateral pleural effusion, pneumonia, volume overload.  Patient presented with acute on chronic respiratory failure with hypoxia, was placed on BiPAP.  At baseline on 2 L O2 via [place] -O2 sats now stable, 100% on 2 L, at baseline   Active Problems: Right lower lobe pneumonia with parapneumonic effusion -Chest tube was placed on 12/13/2021, 200 cc of light brown fluid  out, pleural fluid analysis shows likely exudative effusion.  MRSA screen positive. -Follow pleural fluid cultures, so far NTD, blood cultures NTD -Continues to have chest tube, PCCM following for the chest tube management -Continue pain control, continue IV vancomycin and cefepime   ESRD on HD, TTS, hyperkalemia -Nephrology following, hemodialysis per patient schedule    Lupus (systemic lupus erythematosus) ([place]) -Not on any medications, chronic and stable    Pericardial effusion, combined systolic and diastolic CHF ([facility]) -Repeat echo showed EF of 25 to 30%, LV global hypokinesis, mild LVH, G2 DD, severely dilated LA, moderately dilated RA and moderate pericardial effusion.  No cardiac tamponade. -Symptoms including dyspnea, lower extremity edema improved with HD -Continue Coreg    Sepsis ([place]) -Patient met sepsis criteria at the time of admission with fevers, tachypnea, hypoxia, tachycardia, leukocytosis, source likely due to parapneumonic effusion, pneumonia -Continue IV antibiotics, sepsis physiology resolved  Anemia of chronic disease -H&H stable, hemoglobin 8.7 -Hold IV iron in the setting of infection,  Leukocytosis -WBC count trending up, continue IV vancomycin, cefepime -Follow pleural fluid cultures, currently has chest tube, repeat CBC  Moderate protein calorie  malnutrition, hypoalbuminemia 2.9 Estimated body mass index is 18.37 kg/m as calculated from the following:   Height as of this encounter: _0  (1.702 m).   Weight as of this encounter: 53.2 kg.  Code Status: Full code DVT Prophylaxis:  heparin injection 5,000 Units Start: 12/13/21 2000 SCDs Start: 12/13/21 1459   Level of Care: Level of care: Progressive Family Communication: Discussed all imaging results, lab results, explained to the patient    Disposition Plan:     Status is: Inpatient  Remains inpatient appropriate because: + Chest tube  Time Spent in minutes   37 minutes  Procedures:   BiPAP Hemodialysis Chest tube placement  Consultants:   Pulmonary critical care Nephrology  Antimicrobials:   Anti-infectives (From admission, onward)    Start     Dose/Rate Route Frequency Ordered Stop   12/14/21 1800  ceFEPIme (MAXIPIME) 1 g in sodium chloride 0.9 % 100 mL IVPB        1 g 200 mL/hr over 30 Minutes Intravenous Every 24 hours 12/13/21 1127     12/14/21 0845  vancomycin (VANCOREADY) IVPB 500 mg/100 mL        500 mg 100 mL/hr over 60 Minutes Intravenous  Once 12/14/21 0749 12/14/21 1039   12/13/21 1130  ceFEPIme (MAXIPIME) 2 g in sodium chloride 0.9 % 100 mL IVPB  Status:  Discontinued        2 g 200 mL/hr over 30 Minutes Intravenous  Once 12/13/21 1122 12/13/21 1127   12/13/21 1130  vancomycin (VANCOCIN) IVPB 1000 mg/200 mL premix        1,000 mg 200 mL/hr over 60 Minutes Intravenous  Once 12/13/21 1127 12/13/21 1450   12/13/21 1130  ceFEPIme (MAXIPIME) 2 g in sodium chloride 0.9 % 100 mL IVPB        2 g 200 mL/hr over 30 Minutes Intravenous  Once 12/13/21 1127 12/13/21 1206   12/13/21 1128  vancomycin variable dose per unstable renal function (pharmacist dosing)         Does not apply See admin instructions 12/13/21 1128     12/13/21 1115  metroNIDAZOLE (FLAGYL) IVPB 500 mg  Status:  Discontinued        500 mg 100 mL/hr over 60 Minutes Intravenous Every 12 hours 12/13/21 1105 12/13/21 1700          Medications  Scheduled Meds:  carvedilol  12.5 mg Oral BID WC   Chlorhexidine Gluconate Cloth  6 each Topical Q0600   darbepoetin (ARANESP) injection - NON-DIALYSIS  200 mcg Subcutaneous Q Fri-1800   doxercalciferol  4 mcg Intravenous Q T,Th,Sa-HD   heparin  5,000 Units Subcutaneous Q8H   hydrALAZINE  25 mg Oral Q8H   hydrocerin   Topical BID   mouth rinse  15 mL Mouth Rinse BID   mupirocin ointment  1 application Nasal BID   scopolamine  1 patch Transdermal Q72H   sodium chloride flush  10 mL Other Q8H   sodium chloride flush  10-40 mL Intracatheter  Q12H   vancomycin variable dose per unstable renal function (pharmacist dosing)   Does not apply See admin instructions   Continuous Infusions:  ceFEPime (MAXIPIME) IV Stopped (12/14/21 1837)   PRN Meds:.acetaminophen, docusate sodium, hydrALAZINE, HYDROmorphone, hydrOXYzine, methocarbamol, morphine injection, polyethylene glycol, sodium chloride flush, white petrolatum      Subjective:   Cathy [name] was seen and examined today.  Complaining of itching but otherwise no acute chest pain or shortness of breath.  Overall improving, sats  at baseline 100% on 2 L via [place].  Still has chest tube.  No acute events overnight.    Objective:   Vitals:   12/15/21 0830 12/15/21 0831 12/15/21 0845 12/15/21 0900  BP: (!) 146/109 (!) 146/109 (!) 135/123 (!) 142/113  Pulse: 73 74 73 72  Resp: (!) 21  18 (!) 29  Temp:      TempSrc:      SpO2: 100%  94% 100%  Weight:      Height:        Intake/Output Summary (Last 24 hours) at 12/15/2021 1020 Last data filed at 12/15/2021 5053 Gross per 24 hour  Intake 630 ml  Output 150 ml  Net 480 ml     Wt Readings from Last 3 Encounters:  12/15/21 53.2 kg  06/15/21 65.8 kg  06/10/21 68 kg     Exam General: Alert and oriented x 3, NAD Cardiovascular: S1 S2 auscultated, no murmurs, RRR Respiratory: Diminished lung sounds, no wheezing Gastrointestinal: Soft, nontender, nondistended, + bowel sounds Ext: trace use pedal edema bilaterally Neuro: no new deficits Psych: Normal affect and demeanor, alert and oriented x3    Data Reviewed:  I have personally reviewed following labs and imaging studies  Micro Results Recent Results (from the past 240 hour(s))  Resp Panel by RT-PCR (Flu A&B, Covid) Nasopharyngeal Swab     Status: None   Collection Time: 12/13/21  9:55 AM   Specimen: Nasopharyngeal Swab; Nasopharyngeal(NP) swabs in vial transport medium  Result Value Ref Range Status   SARS Coronavirus 2 by RT PCR NEGATIVE NEGATIVE Final     Comment: (NOTE) SARS-CoV-2 target nucleic acids are NOT DETECTED.  The SARS-CoV-2 RNA is generally detectable in upper respiratory specimens during the acute phase of infection. The lowest concentration of SARS-CoV-2 viral copies this assay can detect is 138 copies/mL. A negative result does not preclude SARS-Cov-2 infection and should not be used as the sole basis for treatment or other patient management decisions. A negative result may occur with  improper specimen collection/handling, submission of specimen other than nasopharyngeal swab, presence of viral mutation(s) within the areas targeted by this assay, and inadequate number of viral copies(<138 copies/mL). A negative result must be combined with clinical observations, patient history, and epidemiological information. The expected result is Negative.  Fact Sheet for Patients:  EntrepreneurPulse.com.au  Fact Sheet for Healthcare Providers:  IncredibleEmployment.be  This test is no t yet approved or cleared by the [place] FDA and  has been authorized for detection and/or diagnosis of SARS-CoV-2 by FDA under an Emergency Use Authorization (EUA). This EUA will remain  in effect (meaning this test can be used) for the duration of the COVID-19 declaration under Section 564(b)(1) of the Act, 21 U.S.C.section 360bbb-3(b)(1), unless the authorization is terminated  or revoked sooner.       Influenza A by PCR NEGATIVE NEGATIVE Final   Influenza B by PCR NEGATIVE NEGATIVE Final    Comment: (NOTE) The Xpert Xpress SARS-CoV-2/FLU/RSV plus assay is intended as an aid in the diagnosis of influenza from Nasopharyngeal swab specimens and should not be used as a sole basis for treatment. Nasal washings and aspirates are unacceptable for Xpert Xpress SARS-CoV-2/FLU/RSV testing.  Fact Sheet for Patients: EntrepreneurPulse.com.au  Fact Sheet for Healthcare  Providers: IncredibleEmployment.be  This test is not yet approved or cleared by the [place] FDA and has been authorized for detection and/or diagnosis of SARS-CoV-2 by FDA under an Emergency Use Authorization (EUA). This EUA will remain  in effect (meaning this test can be used) for the duration of the COVID-19 declaration under Section 564(b)(1) of the Act, 21 U.S.C. section 360bbb-3(b)(1), unless the authorization is terminated or revoked.  Performed at [facility], [place] [address], [place], [place] [address]   Blood Culture (routine x 2)     Status: None (Preliminary result)   Collection Time: 12/13/21 10:00 AM   Specimen: BLOOD  Result Value Ref Range Status   Specimen Description BLOOD RIGHT ANTECUBITAL  Final   Special Requests   Final    BOTTLES DRAWN AEROBIC AND ANAEROBIC Blood Culture results may not be optimal due to an excessive volume of blood received in culture bottles   Culture   Final    NO GROWTH 1 DAY Performed at [facility], [place] [address] [place], [place] [address]    Report Status PENDING  Incomplete  Blood Culture (routine x 2)     Status: None (Preliminary result)   Collection Time: 12/13/21 10:05 AM   Specimen: BLOOD  Result Value Ref Range Status   Specimen Description BLOOD LEFT ANTECUBITAL  Final   Special Requests   Final    BOTTLES DRAWN AEROBIC AND ANAEROBIC Blood Culture adequate volume   Culture   Final    NO GROWTH 1 DAY Performed at [facility], [place] [address] [place], [place] [address]    Report Status PENDING  Incomplete  Pleural fluid culture w Gram Stain     Status: None (Preliminary result)   Collection Time: 12/13/21  3:14 PM   Specimen: Pleural Fluid  Result Value Ref Range Status   Specimen Description PLEURAL  Final   Special Requests Normal  Final   Gram Stain   Final    ABUNDANT WBC PRESENT, PREDOMINANTLY PMN NO ORGANISMS SEEN    Culture   Final    NO GROWTH 2 DAYS Performed at  [facility], [place] [address], [place], [place] [address]    Report Status PENDING  Incomplete  MRSA Next Gen by PCR, Nasal     Status: Abnormal   Collection Time: 12/13/21  4:24 PM   Specimen: Nasal Mucosa; Nasal Swab  Result Value Ref Range Status   MRSA by PCR Next Gen DETECTED (A) NOT DETECTED Final    Comment: RESULT CALLED TO, READ BACK BY AND VERIFIED WITH: BECKY CUMMIUNGS 12/13/2021 _0  BY JW (NOTE) The GeneXpert MRSA Assay (FDA approved for NASAL specimens only), is one component of a comprehensive MRSA colonization surveillance program. It is not intended to diagnose MRSA infection nor to guide or monitor treatment for MRSA infections. Test performance is not FDA approved in patients less than 63 years old. Performed at [facility], [place] [address] [place], [place] [address]     Radiology Reports CT Angio Chest PE W and/or Wo Contrast  Result Date: 12/13/2021 CLINICAL DATA:  Shortness of breath, chest pain, epigastric pain EXAM: CT ANGIOGRAPHY CHEST WITH CONTRAST TECHNIQUE: Multidetector CT imaging of the chest was performed using the standard protocol during bolus administration of intravenous contrast. Multiplanar CT image reconstructions and MIPs were obtained to evaluate the vascular anatomy. RADIATION DOSE REDUCTION: This exam was performed according to the departmental dose-optimization program which includes automated exposure control, adjustment of the mA and/or kV according to patient size and/or use of iterative reconstruction technique. CONTRAST:  147m OMNIPAQUE IOHEXOL 350 MG/ML SOLN COMPARISON:  02/01/2019 FINDINGS: Cardiovascular: Heart is enlarged in size. There is moderate to  large pericardial effusion. There is no contrast enhancement in thoracic aorta limiting evaluation of the lumen of thoracic aorta. There are no intraluminal filling defects in central pulmonary artery branches. Evaluation of small subsegmental peripheral branches in the lower lung  fields is limited by infiltrates. There is ectasia of main pulmonary artery measuring 4 cm. There is no evidence of right ventricular strain. Mediastinum/Nodes: No significant lymphadenopathy seen. Lungs/Pleura: There is moderate right pleural effusion. There is small left pleural effusion. Large patchy alveolar infiltrate is seen in right lower lobe. There are smaller patchy infiltrates in right middle lobe and left lower lobe. Upper Abdomen: Unremarkable. Musculoskeletal: Unremarkable. Review of the MIP images confirms the above findings. IMPRESSION: There is no evidence of central pulmonary artery embolism. Evaluation of small peripheral subsegmental branches in the lower lobes is limited by infiltrates. There is ectasia of main pulmonary artery suggesting pulmonary arterial hypertension. There is large patchy alveolar infiltrate in the right lower lobe. Small patchy alveolar infiltrates are seen in the right middle lobe and left lower lobe. Findings suggest possible multifocal pneumonia. Moderate right pleural effusion. Small left pleural effusion. Moderate to large pericardial effusion. Electronically Signed   By: Elmer Picker M.D.   On: 12/13/2021 13:44   CT ABDOMEN PELVIS W CONTRAST  Result Date: 12/13/2021 CLINICAL DATA:  Abdominal pain.  Patient unable to lie flat EXAM: CT ABDOMEN AND PELVIS WITH CONTRAST TECHNIQUE: Multidetector CT imaging of the abdomen and pelvis was performed using the standard protocol following bolus administration of intravenous contrast. RADIATION DOSE REDUCTION: This exam was performed according to the departmental dose-optimization program which includes automated exposure control, adjustment of the mA and/or kV according to patient size and/or use of iterative reconstruction technique. CONTRAST:  151mL OMNIPAQUE IOHEXOL 350 MG/ML SOLN COMPARISON:  Chest CT same day FINDINGS: Lower chest: RIGHT lobe pneumonia. Large RIGHT effusion with passive atelectasis in the RIGHT  lower lobe. Cardiomegaly Hepatobiliary: No enhancing hepatic lesion. Portal veins are patent. There is periportal edema. Small fluid around the liver. Gallbladder normal. Pancreas: small cyst in the pancreas measures 6 mm (image 47/3). No worrisome characteristics. No communication with the pancreatic duct. No pancreatic duct dilatation. Spleen: Normal spleen Adrenals/urinary tract: Adrenal glands normal. Four enhancement of the renal parenchyma. No hydronephrosis. Ureters and bladder grossly normal. Bladder collapsed Stomach/Bowel: Stomach, small bowel normal. Colon rectosigmoid colon normal. Vascular/Lymphatic: Abdominal aorta is normal caliber. There is no retroperitoneal or periportal lymphadenopathy. No pelvic lymphadenopathy. Reproductive: Uterus and adnexa unremarkable. Other: Normal moderate volume of intraperitoneal free fluid there is prominent pelvis. Mild anasarca soft tissues Musculoskeletal: No aggressive osseous lesion. IMPRESSION: 1. RIGHT lower lobe pneumonia. 2. Moderate volume intraperitoneal free fluid, anasarca and pleural effusions suggest volume overload/congestive heart failure. 3. Poor renal enhancement consistent with renal insufficiency. 4. Small benign appearing cyst in the pancreas. Per consensus recommendation, consider follow-up MRI in 1 year. This recommendation follows ACR consensus guidelines: Management of Incidental Pancreatic Cysts: A White Paper of the ACR Incidental Findings Committee. [place] 6301;[id number]. Electronically Signed   By: Suzy Bouchard M.D.   On: 12/13/2021 13:51   DG CHEST PORT 1 VIEW  Result Date: 12/15/2021 CLINICAL DATA:  Pleural effusion, difficulty breathing EXAM: PORTABLE CHEST 1 VIEW COMPARISON:  Previous studies including the examination of 12/14/2021 FINDINGS: Transverse diameter of heart is increased. There are no signs of pulmonary edema. Infiltrates are seen in both lower lung fields with interval partial clearing. Right chest tube  is noted with its tip in  the medial right lower lung fields. Tip of dialysis catheter is seen in the region right atrium. There is no pneumothorax. IMPRESSION: Cardiomegaly. There are no signs of pulmonary edema. There is interval improvement in aeration of both lower lung fields suggesting partial clearing of atelectasis/pneumonia and possibly decrease in pleural effusions. There is no pneumothorax. Electronically Signed   By: Elmer Picker M.D.   On: 12/15/2021 08:38   DG Chest Port 1 View  Result Date: 12/14/2021 CLINICAL DATA:  Chest tube placement, recheck right pneumothorax. EXAM: PORTABLE CHEST 1 VIEW COMPARISON:  Chest CT with contrast yesterday at 1:18 p.m., portable chest yesterday at 5:01 p.m. FINDINGS: Pigtail right pleural drain again has its tip in the medial base of the right chest. Minimal right apical pneumothorax estimated 3% or less continues to be noted and is unchanged. Right basilar airspace disease and at least a small pleural effusion continue to be noted with dense left lower lobe consolidation still present and a small left effusion. The more cephalad lung fields are clear. There is a right IJ double-lumen catheter with the tip in the right atrium. There is cardiomegaly with normal caliber central vessels and stable mediastinum. IMPRESSION: There are no changes in the overall aeration from yesterday's most recent film. Minimal 3% or less right apical pneumothorax persists as well as right basilar airspace disease and small right effusion. Aeration on left is likewise unchanged with denser lower lobe consolidation and small effusion on this side as well. Chest tube positioning is unaltered. Electronically Signed   By: Telford Nab M.D.   On: 12/14/2021 03:54   DG Chest Port 1 View  Result Date: 12/13/2021 CLINICAL DATA:  Right pleural effusion.  Chest tube placement. EXAM: PORTABLE CHEST 1 VIEW COMPARISON:  12/13/2021 FINDINGS: A new right pleural pigtail catheter is seen in  the lower right hemithorax, with decreased size of small right pleural effusion since prior study. A tiny less than 5% right apical pneumothorax is now seen. Airspace disease in the right lower lung shows no significant change. Small left pleural effusion and left retrocardiac atelectasis or infiltrate, also unchanged. Cardiomegaly stable. A right jugular dual-lumen central venous catheter is seen with tip overlying the mid right atrium. IMPRESSION: Decreased right pleural effusion following pleural drainage catheter placement. Tiny less than 5% right apical pneumothorax. Stable right lower lung airspace disease. Stable small left pleural effusion, and left basilar atelectasis versus infiltrate. Stable cardiomegaly. Electronically Signed   By: Marlaine Hind M.D.   On: 12/13/2021 17:29   DG Chest Port 1 View  Result Date: 12/13/2021 CLINICAL DATA:  Questionable sepsis.  Evaluate for abnormality. EXAM: PORTABLE CHEST 1 VIEW COMPARISON:  06/15/2021 FINDINGS: There is a right jugular dialysis catheter with the tip extending into the right atrium. Bibasilar chest densities are compatible with pleural effusions and basilar consolidation or airspace disease. Pleural effusions are likely moderate in size particularly on the right side. Negative for pneumothorax. Cardiac silhouette appears to be enlarged and stable from the previous examination. IMPRESSION: New or enlarged bibasilar chest densities. Findings are suggestive for combination of pleural effusions with consolidation/airspace disease. Underlying pneumonia cannot be excluded. Dialysis catheter as described. Electronically Signed   By: Markus Daft M.D.   On: 12/13/2021 11:37   ECHOCARDIOGRAM COMPLETE  Result Date: 12/13/2021    ECHOCARDIOGRAM REPORT   Patient Name:   Cathy [name] Date of Exam: 12/13/2021 Medical Rec #:  [id number]        Height:  67.0 in Accession #:    [id number]       Weight:       146.2 lb Date of Birth:  Sep 05, 1981       BSA:           1.770 m Patient Age:    25 years         BP:           178/136 mmHg Patient Gender: F                HR:           93 bpm. Exam Location:  Inpatient Procedure: 2D Echo, Cardiac Doppler and Color Doppler Indications:    plureral effusion  History:        Patient has no prior history of Echocardiogram examinations.                 Signs/Symptoms:Dyspnea. GERD / PLEURAL EFFUSION.  Sonographer:    Beryle Beams Referring Phys: [id number] [place]  1. Left ventricular ejection fraction, by estimation, is 25 to 30%. The left ventricle has severely decreased function. The left ventricle demonstrates global hypokinesis. The left ventricular internal cavity size was mildly dilated. There is mild concentric left ventricular hypertrophy. Left ventricular diastolic parameters are consistent with Grade II diastolic dysfunction (pseudonormalization).  2. Right ventricular systolic function mildly-to-moderately reduced. The right ventricular size is mildly-to-moderately enlarged. There is mildly elevated pulmonary artery systolic pressure. The estimated right ventricular systolic pressure is 47.8 mmHg.  3. Left atrial size was severely dilated.  4. Right atrial size was moderately dilated.  5. Moderate pericardial effusion. The pericardial effusion is circumferential. There is no evidence of cardiac tamponade.  6. The mitral valve is abnormal. There is mitral annular dilation with tethering of the posterior leaflet and mild mitral regurgitation.  7. Tricuspid valve regurgitation is moderate.  8. The aortic valve is tricuspid. There is mild thickening of the aortic valve. Aortic valve regurgitation is mild. No aortic stenosis is present.  9. The inferior vena cava is dilated in size with <50% respiratory variability, suggesting right atrial pressure of 15 mmHg. Comparison(s): Compared to prior TTE in 05/2021, there is no significant change. There continues to be biventricular dysfunction and a moderate pericardial  effusion present without tamponade. FINDINGS  Left Ventricle: Left ventricular ejection fraction, by estimation, is 25 to 30%. The left ventricle has severely decreased function. The left ventricle demonstrates global hypokinesis. The left ventricular internal cavity size was mildly dilated. There is mild concentric left ventricular hypertrophy. Left ventricular diastolic parameters are consistent with Grade II diastolic dysfunction (pseudonormalization). Right Ventricle: The right ventricular size is mildly-to-moderately enlarged. No increase in right ventricular wall thickness. Right ventricular systolic function mildly-to-moderately reduced. There is mildly elevated pulmonary artery systolic pressure. The tricuspid regurgitant velocity is 2.62 m/s, and with an assumed right atrial pressure of 10 mmHg, the estimated right ventricular systolic pressure is 29.5 mmHg. Left Atrium: Left atrial size was severely dilated. Right Atrium: Right atrial size was moderately dilated. Pericardium: Measuring up to 1.5cm at end-diastole. A moderately sized pericardial effusion is present. The pericardial effusion is circumferential. There is no evidence of cardiac tamponade. Mitral Valve: The mitral valve is abnormal. There is mild thickening of the mitral valve leaflet(s). There is mild calcification of the mitral valve leaflet(s). There is mild mitral regurgitation. Tricuspid Valve: The tricuspid valve is normal in structure. Tricuspid valve regurgitation is moderate. Aortic Valve: The aortic valve is tricuspid. There  is mild thickening of the aortic valve. Aortic valve regurgitation is mild. Aortic regurgitation PHT measures 462 msec. No aortic stenosis is present. Aortic valve mean gradient measures 4.0 mmHg. Aortic  valve peak gradient measures 6.1 mmHg. Aortic valve area, by VTI measures 1.18 cm. Pulmonic Valve: The pulmonic valve was normal in structure. Pulmonic valve regurgitation is mild. Aorta: The aortic root and  ascending aorta are structurally normal, with no evidence of dilitation. Venous: The inferior vena cava is dilated in size with less than 50% respiratory variability, suggesting right atrial pressure of 15 mmHg. IAS/Shunts: The atrial septum is grossly normal.  LEFT VENTRICLE PLAX 2D LVIDd:         2.32 cm      Diastology LVIDs:         3.90 cm      LV e' medial:    6.20 cm/s LV PW:         1.50 cm      LV E/e' medial:  16.6 LV IVS:        3.36 cm      LV e' lateral:   8.81 cm/s LVOT diam:     1.70 cm      LV E/e' lateral: 11.7 LV SV:         20 LV SV Index:   11 LVOT Area:     2.27 cm  LV Volumes (MOD) LV vol d, MOD A2C: 132.0 ml LV vol d, MOD A4C: 117.0 ml LV vol s, MOD A2C: 98.8 ml LV vol s, MOD A4C: 107.0 ml LV SV MOD A2C:     33.2 ml LV SV MOD A4C:     117.0 ml LV SV MOD BP:      30.0 ml RIGHT VENTRICLE             IVC RV S prime:     11.30 cm/s  IVC diam: 2.20 cm TAPSE (M-mode): 2.0 cm LEFT ATRIUM              Index        RIGHT ATRIUM           Index LA diam:        4.30 cm  2.43 cm/m   RA Area:     14.10 cm LA Vol (A2C):   106.0 ml 59.89 ml/m  RA Volume:   33.40 ml  18.87 ml/m LA Vol (A4C):   73.0 ml  41.24 ml/m LA Biplane Vol: 89.6 ml  50.62 ml/m  AORTIC VALVE                    PULMONIC VALVE AV Area (Vmax):    1.40 cm     PV Vmax:       0.69 m/s AV Area (Vmean):   1.19 cm     PV Vmean:      37.100 cm/s AV Area (VTI):     1.18 cm     PV VTI:        0.066 m AV Vmax:           123.00 cm/s  PV Peak grad:  1.9 mmHg AV Vmean:          91.700 cm/s  PV Mean grad:  1.0 mmHg AV VTI:            0.171 m AV Peak Grad:      6.1 mmHg AV Mean Grad:      4.0 mmHg LVOT Vmax:  75.80 cm/s LVOT Vmean:        48.200 cm/s LVOT VTI:          0.089 m LVOT/AV VTI ratio: 0.52 AI PHT:            462 msec  AORTA Ao Root diam: 2.70 cm Ao Asc diam:  2.30 cm MITRAL VALVE                TRICUSPID VALVE MV Area (PHT): 5.34 cm     TV Peak grad:   68.6 mmHg MV Decel Time: 142 msec     TV Mean grad:   54.0 mmHg MV E  velocity: 103.00 cm/s  TV Vmax:        4.14 m/s MV A velocity: 60.70 cm/s   TV Vmean:       355.0 cm/s MV E/A ratio:  1.70         TV VTI:         1.26 msec                             TR Peak grad:   27.5 mmHg                             TR Vmax:        262.00 cm/s                              SHUNTS                             Systemic VTI:  0.09 m                             Systemic Diam: 1.70 cm Gwyndolyn Kaufman MD Electronically signed by Gwyndolyn Kaufman MD Signature Date/Time: 12/13/2021/6:58:25 PM    Final     Lab Data:  CBC: Recent Labs  Lab 12/13/21 1005 12/13/21 1136 12/14/21 0258 12/14/21 0913  WBC 12.3*  --  25.2* 30.3*  NEUTROABS 9.7*  --   --   --   HGB 7.4* 8.5* 7.8* 8.7*  HCT 24.7* 25.0* 25.0* 28.5*  MCV 90.5  --  88.0 88.8  PLT 186  --  180 767   Basic Metabolic Panel: Recent Labs  Lab 12/13/21 1005 12/13/21 1136 12/14/21 0258 12/14/21 0913  NA 132* 131* 133* 134*  K 5.0 4.9 5.9* 4.6  CL 92*  --  89* 95*  CO2 26  --  26 23  GLUCOSE 97  --  102* 94  BUN 23*  --  41* 21*  CREATININE 6.53*  --  8.18* 5.07*  CALCIUM 8.8*  --  8.6* 8.9  PHOS  --   --  7.3* 5.5*   GFR: Estimated Creatinine Clearance: 12.6 mL/min (A) (by C-G formula based on SCr of 5.07 mg/dL (H)). Liver Function Tests: Recent Labs  Lab 12/13/21 1005 12/13/21 1530 12/14/21 0258 12/14/21 0913  AST 76*  --   --   --   ALT 40  --   --   --   ALKPHOS 341*  --   --   --   BILITOT 1.0  --   --   --   PROT 6.6 6.8  --   --  ALBUMIN 3.1*  --  2.6* 2.9*   No results for input(s): LIPASE, AMYLASE in the last 168 hours. No results for input(s): AMMONIA in the last 168 hours. Coagulation Profile: Recent Labs  Lab 12/13/21 1005  INR 1.2   Cardiac Enzymes: No results for input(s): CKTOTAL, CKMB, CKMBINDEX, TROPONINI in the last 168 hours. BNP (last 3 results) No results for input(s): PROBNP in the last 8760 hours. HbA1C: No results for input(s): HGBA1C in the last 72  hours. CBG: Recent Labs  Lab 12/13/21 2319 12/14/21 0340 12/14/21 0708 12/14/21 1110 12/14/21 1502  GLUCAP 86 89 73 89 118*   Lipid Profile: No results for input(s): CHOL, HDL, LDLCALC, TRIG, CHOLHDL, LDLDIRECT in the last 72 hours. Thyroid Function Tests: No results for input(s): TSH, T4TOTAL, FREET4, T3FREE, THYROIDAB in the last 72 hours. Anemia Panel: No results for input(s): VITAMINB12, FOLATE, FERRITIN, TIBC, IRON, RETICCTPCT in the last 72 hours. Urine analysis:    Component Value Date/Time   COLORURINE YELLOW 11/29/2020 2054   APPEARANCEUR HAZY (A) 11/29/2020 2054   LABSPEC 1.015 11/29/2020 2054   PHURINE 6.0 11/29/2020 2054   GLUCOSEU NEGATIVE 11/29/2020 2054   HGBUR MODERATE (A) 11/29/2020 2054   BILIRUBINUR NEGATIVE 11/29/2020 2054   [place] 11/29/2020 2054   PROTEINUR >=300 (A) 11/29/2020 2054   UROBILINOGEN 0.2 10/17/2020 1026   NITRITE NEGATIVE 11/29/2020 2054   LEUKOCYTESUR NEGATIVE 11/29/2020 2054     Alla Sloma M.D. [facility] 12/15/2021, 10:20 AM  Available via Epic secure chat 7am-7pm After 7 pm, please refer to night coverage provider listed on amion.

## 2021-12-15 NOTE — Progress Notes (Signed)
[facility] Progress Note  Subjective: seen in room, remains lethargic. Off O2, no c/o's.   Vitals:   12/15/21 1000 12/15/21 1015 12/15/21 1030 12/15/21 1045  BP: 111/81 105/76 122/84 119/86  Pulse: 60  (!) 57 (!) 57  Resp: 19 19 13 14   Temp:      TempSrc:      SpO2: 100%  100% 100%  Weight:      Height:        Exam: Exam   nad , chronically ill appearing  Chest mostly clear bilat ,  R chest tube in place  Cor reg no RG  Abd soft ntnd no ascites   Ext trace pretib , much better   Neuro - somnolent    [facility]    Home meds include - coreg 25 bid, atrovent, avapro 300, bidil 20-37.5 tid, protonix, demadex 60 bid, prosource plus liquid, prns/ vits / supps      OP HD: [place] TTS  4h   2/2 bath  56kg  Hep none   R TDC  - hect 5 ug tiw  - mircera 225 last 12/22  - venofer 100mg  , 6 more doses      Assessment/ Plan: SOB/ acute hypoxic resp failure/ fever / RLL PNA + effusion - better, on IV abx, sp R chest tube, sp 4 L UF on 1/13.  ESRD - usual HD TTS.  Short HD tomorrow, then resume TTS Volume - under dry wt but BP's remain high, prob has extra vol still SLE - not on any medications at this time HTN - BP's still high, lowering vol w/ HD Anemia ckd - Hold IV Fe w/ infection. Hb ~ 8, resumed high dose esa here w/ darbe 200 ug sq weekly.  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 phos in range 5.5, not sure binder. Cont hectorol.        Rob Doctor, hospital  MD 12/14/2021, 3:04 PM    Rob Mallori Araque 12/15/2021, 11:09 AM   Recent Labs  Lab 12/14/21 0258 12/14/21 0913  K 5.9* 4.6  BUN 41* 21*  CREATININE 8.18* 5.07*  CALCIUM 8.6* 8.9  PHOS 7.3* 5.5*  HGB 7.8* 8.7*    Inpatient medications:  carvedilol  12.5 mg Oral BID WC   Chlorhexidine Gluconate Cloth  6 each Topical Q0600   darbepoetin (ARANESP) injection - NON-DIALYSIS  200 mcg Subcutaneous Q Fri-1800   doxercalciferol  4 mcg Intravenous Q T,Th,Sa-HD   heparin  5,000 Units Subcutaneous Q8H   hydrALAZINE  25 mg  Oral Q8H   hydrocerin   Topical BID   mouth rinse  15 mL Mouth Rinse BID   mupirocin ointment  1 application Nasal BID   scopolamine  1 patch Transdermal Q72H   sodium chloride flush  10 mL Other Q8H   sodium chloride flush  10-40 mL Intracatheter Q12H   vancomycin variable dose per unstable renal function (pharmacist dosing)   Does not apply See admin instructions    ceFEPime (MAXIPIME) IV Stopped (12/14/21 1837)   acetaminophen, docusate sodium, hydrALAZINE, HYDROmorphone, hydrOXYzine, methocarbamol, morphine injection, polyethylene glycol, sodium chloride flush, white petrolatum

## 2021-12-15 NOTE — Progress Notes (Signed)
NAME:  Cathy [name], MRN:  [id number], DOB:  1981/01/26, LOS: 2 ADMISSION DATE:  12/13/2021, CONSULTATION DATE: 12/13/2021 REFERRING MD: Dr. Matilde Sprang, CHIEF COMPLAINT: Sepsis  History of Present Illness:  HPI obtained mostly from chart review as patient is able to give limited information, on biPAP and poor historian.     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  Unclear when fever started or if productive cough.  Last iHD on 1/12 with full treatment.     In ER, febrile up to 103, hypertensive, tachycardic, tachpneic, and sats 98% on baseline 2L.  Labs noted for WBC 12K, Hgb 7.4, Na 132, sCr 6.53, Alk phos 341, albumin 3.1, AST 76, trop hs 88 > 95, and BNP > 4.5K (has been elevated > 4.5K since 2021), normal coags.  Venous/ mixed gas 7.5/ 38/ 52/ 30.  CXR noted for bibasilar densities, R pleural effusion > L, and with stable but enlarged cardiac silhouette.  Underwent CTA PE which was negative for PE but noted ectasia of main pulmonary artery suggestive of PAH and large patchy infiltrate of RLL and small patchy infiltrate in RLL, with moderate R pleural effusion, small L pleural effusion, and moderate to large pericardial effusion.  CT abd/ pelvis noted for moderate volume intraperitoneal free fluid, and small benign appearing cyst in pancreas.  She was placed on BiPAP to help with work of breathing.  Cultures sent and empirically started on vancomycin and cefepime.  Nephrology consulted in ER with plans for iHD later this evening.  PCCM called for admit.  Pertinent  Medical History  Systolic and diastolic HF (0/53 EF 97-67%), HTN, ESRD TTS iHD 2/2 SLE (on iHD for ~1 yr), bipolar, anxiety/ depression  - MSSA  bacteremia 2/2 infected line in 05/2021  Significant Hospital Events: Including procedures, antibiotic start and stop dates in addition to other pertinent events   1/13 admitted, RML/ RLL PNA, sepsis, complicated R pleural effusion, acute on chronic HF 1/13 cefepime / vanc  1/13 Bcx2 >  1/13 SARS/ flu  > neg 1/13 MRSA PCR > Pos 1/13 R pleural fluid >   Interim History / Subjective:   Patient is feeling much better today.   Chest tube output 144m over past 24 hours  Chest x-ray improved  Objective   Blood pressure (!) 141/99, pulse 68, temperature 97.8 F (36.6 C), temperature source Oral, resp. rate 18, height 5' 7"  (1.702 m), weight 53.2 kg, SpO2 100 %, unknown if currently breastfeeding.        Intake/Output Summary (Last 24 hours) at 12/15/2021 0805 Last data filed at 12/15/2021 03419Gross per 24 hour  Intake 870 ml  Output 150 ml  Net 720 ml   Filed Weights   12/14/21 0300 12/14/21 0615 12/15/21 0500  Weight: 57 kg 53.2 kg 53.2 kg    Examination: General: Chronically ill-appearing middle-aged female. NAD. HENT: [place]/AT, moist mucous membranes Lungs: clear to auscultation.  No wheezing or rales. Cardiovascular: RRR.  No murmurs rubs or gallops. Abdomen: Soft.  NT/ND.  Normal BS Extremities: Well perfused.  Noe BLE edema Skin: Warm and dry.  Lupus skin changes Neuro: Alert and oriented x3.  Moves all extremities.  Resolved Hospital Problem list  Acute hypoxic respiratory failure Sepsis  Assessment & Plan:  Acute on chronic hypoxic respiratory failure Pneumonia with pneumonic effusion Pneumonia Chest tube in place with 150 cc of light brown fluid out over past 24 hours.  White count continues to trend up. MRSA screen positive.  --Continue empirical antibiotics with Vanc and cefepime --Follow-up pleural fluid culture and bcx --Sputum culture --PCCM will follow for chest tube management --Start dilaudid 2 mg q6hr prn for moderate pain -- Will discontinue chest  tube with output is less than 126m per day  Best Practice (right click and "Reselect all SmartList Selections" daily)   Per primary team  Labs   CBC: Recent Labs  Lab 12/13/21 1005 12/13/21 1136 12/14/21 0258 12/14/21 0913  WBC 12.3*  --  25.2* 30.3*  NEUTROABS 9.7*  --   --   --   HGB 7.4* 8.5* 7.8* 8.7*  HCT 24.7* 25.0* 25.0* 28.5*  MCV 90.5  --  88.0 88.8  PLT 186  --  180 2638   Basic Metabolic Panel: Recent Labs  Lab 12/13/21 1005 12/13/21 1136 12/14/21 0258 12/14/21 0913  NA 132* 131* 133* 134*  K 5.0 4.9 5.9* 4.6  CL 92*  --  89* 95*  CO2 26  --  26 23  GLUCOSE 97  --  102* 94  BUN 23*  --  41* 21*  CREATININE 6.53*  --  8.18* 5.07*  CALCIUM 8.8*  --  8.6* 8.9  PHOS  --   --  7.3* 5.5*   GFR: Estimated Creatinine Clearance: 12.6 mL/min (A) (by C-G formula based on SCr of 5.07 mg/dL (H)). Recent Labs  Lab 12/13/21 1005 12/13/21 1214 12/14/21 0258 12/14/21 0913  WBC 12.3*  --  25.2* 30.3*  LATICACIDVEN 1.2 1.5  --   --     Liver Function Tests: Recent Labs  Lab 12/13/21 1005 12/13/21 1530 12/14/21 0258 12/14/21 0913  AST 76*  --   --   --   ALT 40  --   --   --   ALKPHOS 341*  --   --   --   BILITOT 1.0  --   --   --   PROT 6.6 6.8  --   --   ALBUMIN 3.1*  --  2.6* 2.9*   No results for input(s): LIPASE, AMYLASE in the last 168 hours. No results for input(s): AMMONIA in the last 168 hours.  ABG    Component Value Date/Time   PHART 7.335 (L) 01/18/2021 2111   PCO2ART 45.7 01/18/2021 2111   PO2ART 150 (H) 01/18/2021 2111   HCO3 30.2 (H) 12/13/2021 1136   TCO2 31 12/13/2021 1136   ACIDBASEDEF 1.0 01/18/2021 2111   O2SAT 90.0 12/13/2021 1136     Coagulation Profile: Recent Labs  Lab 12/13/21 1005  INR 1.2    Cardiac Enzymes: No results for input(s): CKTOTAL, CKMB, CKMBINDEX, TROPONINI in the last 168 hours.  HbA1C: No results found for: HGBA1C  CBG: Recent Labs  Lab 12/13/21 2319 12/14/21 0340 12/14/21 0708  12/14/21 1110 12/14/21 1502  GLUCAP 86 89 73 89 118*    Critical care time: n/a    JFreda Jackson MD [facility] Office: [phone]  See Amion for personal pager PCCM on call pager [phone] 7pm. Please call Elink 7p-7a. [phone]

## 2021-12-15 NOTE — Progress Notes (Signed)
Patient wearing oxygen set at 2lpm with no signs of respiratory distress. Bipap on standby if needed.

## 2021-12-16 ENCOUNTER — Inpatient Hospital Stay (HOSPITAL_COMMUNITY): Payer: Medicare Other

## 2021-12-16 DIAGNOSIS — Z4682 Encounter for fitting and adjustment of non-vascular catheter: Secondary | ICD-10-CM

## 2021-12-16 DIAGNOSIS — Z9689 Presence of other specified functional implants: Secondary | ICD-10-CM

## 2021-12-16 DIAGNOSIS — J189 Pneumonia, unspecified organism: Secondary | ICD-10-CM

## 2021-12-16 DIAGNOSIS — I5020 Unspecified systolic (congestive) heart failure: Secondary | ICD-10-CM

## 2021-12-16 DIAGNOSIS — N186 End stage renal disease: Secondary | ICD-10-CM | POA: Diagnosis not present

## 2021-12-16 DIAGNOSIS — M3214 Glomerular disease in systemic lupus erythematosus: Secondary | ICD-10-CM

## 2021-12-16 DIAGNOSIS — J918 Pleural effusion in other conditions classified elsewhere: Secondary | ICD-10-CM

## 2021-12-16 LAB — BODY FLUID CULTURE W GRAM STAIN
Culture: NO GROWTH
Special Requests: NORMAL

## 2021-12-16 LAB — COMPREHENSIVE METABOLIC PANEL
ALT: 43 U/L (ref 0–44)
AST: 48 U/L — ABNORMAL HIGH (ref 15–41)
Albumin: 2.6 g/dL — ABNORMAL LOW (ref 3.5–5.0)
Alkaline Phosphatase: 255 U/L — ABNORMAL HIGH (ref 38–126)
Anion gap: 17 — ABNORMAL HIGH (ref 5–15)
BUN: 64 mg/dL — ABNORMAL HIGH (ref 6–20)
CO2: 20 mmol/L — ABNORMAL LOW (ref 22–32)
Calcium: 8 mg/dL — ABNORMAL LOW (ref 8.9–10.3)
Chloride: 92 mmol/L — ABNORMAL LOW (ref 98–111)
Creatinine, Ser: 8.66 mg/dL — ABNORMAL HIGH (ref 0.44–1.00)
GFR, Estimated: 6 mL/min — ABNORMAL LOW (ref >60)
Glucose, Bld: 101 mg/dL — ABNORMAL HIGH (ref 70–99)
Potassium: 5.5 mmol/L — ABNORMAL HIGH (ref 3.5–5.1)
Sodium: 129 mmol/L — ABNORMAL LOW (ref 135–145)
Total Bilirubin: 0.7 mg/dL (ref 0.3–1.2)
Total Protein: 6.9 g/dL (ref 6.5–8.1)

## 2021-12-16 LAB — CBC WITH DIFFERENTIAL/PLATELET
Abs Immature Granulocytes: 0.2 10*3/uL — ABNORMAL HIGH (ref 0.00–0.07)
Basophils Absolute: 0 10*3/uL (ref 0.0–0.1)
Basophils Relative: 0 %
Eosinophils Absolute: 0 10*3/uL (ref 0.0–0.5)
Eosinophils Relative: 0 %
HCT: 25.9 % — ABNORMAL LOW (ref 36.0–46.0)
Hemoglobin: 7.9 g/dL — ABNORMAL LOW (ref 12.0–15.0)
Immature Granulocytes: 1 %
Lymphocytes Relative: 4 %
Lymphs Abs: 0.9 10*3/uL (ref 0.7–4.0)
MCH: 27.1 pg (ref 26.0–34.0)
MCHC: 30.5 g/dL (ref 30.0–36.0)
MCV: 88.7 fL (ref 80.0–100.0)
Monocytes Absolute: 1.3 10*3/uL — ABNORMAL HIGH (ref 0.1–1.0)
Monocytes Relative: 6 %
Neutro Abs: 19.3 10*3/uL — ABNORMAL HIGH (ref 1.7–7.7)
Neutrophils Relative %: 89 %
Platelets: 275 10*3/uL (ref 150–400)
RBC: 2.92 MIL/uL — ABNORMAL LOW (ref 3.87–5.11)
RDW: 16.7 % — ABNORMAL HIGH (ref 11.5–15.5)
WBC: 21.8 10*3/uL — ABNORMAL HIGH (ref 4.0–10.5)
nRBC: 0.1 % (ref 0.0–0.2)

## 2021-12-16 MED ORDER — DARBEPOETIN ALFA 200 MCG/0.4ML IJ SOSY: 200.0000 ug | PREFILLED_SYRINGE | INTRAMUSCULAR | Status: DC

## 2021-12-16 MED ORDER — SODIUM CHLORIDE 0.9 % IV SOLN
1.0000 g | Freq: Every day | INTRAVENOUS | Status: DC
  Administered 2021-12-16: 1 g via INTRAVENOUS
  Filled 2021-12-16 (×2): qty 1

## 2021-12-16 MED ORDER — HEPARIN SODIUM (PORCINE) 1000 UNIT/ML IJ SOLN
INTRAMUSCULAR | Status: AC
  Administered 2021-12-16: 1000 [IU]
  Filled 2021-12-16: qty 4

## 2021-12-16 NOTE — Progress Notes (Signed)
Nephrology Follow-Up Consult note   Assessment/Recommendations: Cathy [name] is a/an 41 y.o. female with a past medical history significant for SLE now ESRD, admitted for PNA and AHRF.      OP HD: [place] TTS  4h   2/2 bath  56kg  Hep none   R TDC  - hect 5 ug tiw  - mircera 225 last 12/22  - venofer 100mg  , 6 more doses   ESRD: TTS outpatient. Short HD session today then return to TTS schedule tomorrow  AHRF/PNA: abx per primary team. R chest tube in place. Fairly aggressive UF with HD. Under EDW.  SLE: essentially burnt out at this point. No on any meds.  HTN: watch after HD and UF. 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 Holding iron given infection  Secondary hyperparathyroidism: Continue hecterol. No binder currently  Hyponatremia/hypokalemia: Related to ESRD.  Dialysis as above     Recommendations conveyed to primary service.    [facility] 12/16/2021 8:48 AM  ___________________________________________________________  CC: ESRD, hypoxia  Interval History/Subjective: Patient tired today but overall feels less confused and more alert.  Oxygen requirements overall improved.   Medications:  Current Facility-Administered Medications  Medication Dose Route Frequency Provider Last Rate Last Admin   acetaminophen (TYLENOL) tablet 650 mg  650 mg Oral Q6H PRN Lacinda Axon, MD   650 mg at 12/15/21 0602   carvedilol (COREG) tablet 12.5 mg  12.5 mg Oral BID WC Hunsucker, Bonna Gains, MD   12.5 mg at 12/15/21 1750   ceFEPIme (MAXIPIME) 1 g in sodium chloride 0.9 % 100 mL IVPB  1 g Intravenous Q24H Kommor, Madison, MD 200 mL/hr at 12/15/21 1800 Infusion Verify at 12/15/21 1800   Chlorhexidine Gluconate Cloth 2 % PADS 6 each  6 each Topical Q0600 Roney Jaffe, MD   6 each at 12/16/21 1683   Darbepoetin Alfa (ARANESP) injection 200 mcg  200 mcg Subcutaneous Q Fri-1800 Roney Jaffe, MD   200 mcg at 12/13/21 1848    diphenhydrAMINE (BENADRYL) injection 12.5 mg  12.5 mg Intravenous Q6H PRN Rai, Ripudeep K, MD   12.5 mg at 12/15/21 2006   docusate sodium (COLACE) capsule 100 mg  100 mg Oral BID PRN Jennelle Human B, NP   100 mg at 12/15/21 0602   doxercalciferol (HECTOROL) injection 4 mcg  4 mcg Intravenous Q T,Th,Sa-HD Roney Jaffe, MD       heparin injection 5,000 Units  5,000 Units Subcutaneous Q8H Jennelle Human B, NP   5,000 Units at 12/16/21 7290   hydrALAZINE (APRESOLINE) injection 10 mg  10 mg Intravenous Q4H PRN Jennelle Human B, NP       hydrALAZINE (APRESOLINE) tablet 25 mg  25 mg Oral Q8H Hunsucker, Bonna Gains, MD   25 mg at 12/16/21 2111   hydrocerin (EUCERIN) cream   Topical BID Lacinda Axon, MD   Given at 12/15/21 2125   HYDROmorphone (DILAUDID) tablet 2 mg  2 mg Oral Q6H PRN Lacinda Axon, MD   2 mg at 12/15/21 2257   hydrOXYzine (ATARAX) tablet 25 mg  25 mg Oral TID PRN Rai, Ripudeep K, MD   25 mg at 12/15/21 2257   MEDLINE mouth rinse  15 mL Mouth Rinse BID Hunsucker, Bonna Gains, MD   15 mL at 12/14/21 2253   methocarbamol (ROBAXIN) tablet 500 mg  500 mg Oral Q6H PRN Anders Simmonds, MD   500 mg at 12/14/21 2040   morphine  2 MG/ML injection 2 mg  2 mg Intravenous Q3H PRN Jennelle Human B, NP   2 mg at 12/16/21 0020   mupirocin ointment (BACTROBAN) 2 % 1 application  1 application Nasal BID Hunsucker, Bonna Gains, MD   1 application at 68/34/19 2125   polyethylene glycol (MIRALAX / GLYCOLAX) packet 17 g  17 g Oral Daily PRN Jennelle Human B, NP       scopolamine (TRANSDERM-SCOP) 1 MG/3DAYS 1.5 mg  1 patch Transdermal Q72H Ogan, [place]oron[place]wo U, MD   1.5 mg at 12/14/21 6222   sodium chloride flush (NS) 0.9 % injection 10 mL  10 mL Other Q8H Jennelle Human B, NP   10 mL at 12/16/21 0000   sodium chloride flush (NS) 0.9 % injection 10-40 mL  10-40 mL Intracatheter Q12H Hunsucker, Bonna Gains, MD   10 mL at 12/13/21 2252   sodium chloride flush (NS) 0.9 % injection 10-40 mL  10-40 mL  Intracatheter PRN Hunsucker, Bonna Gains, MD       vancomycin variable dose per unstable renal function (pharmacist dosing)   Does not apply See admin instructions Kommor, Madison, MD       white petrolatum (VASELINE) gel   Topical PRN Anders Simmonds, MD   Given at 12/14/21 2040      Review of Systems: 10 systems reviewed and negative except per interval history/subjective  Physical Exam: Vitals:   12/16/21 0600 12/16/21 0735  BP: (!) 137/106   Pulse:    Resp: (!) 26   Temp:  98.2 F (36.8 C)  SpO2:     No intake/output data recorded.  Intake/Output Summary (Last 24 hours) at 12/16/2021 0848 Last data filed at 12/16/2021 0600 Gross per 24 hour  Intake 56.82 ml  Output 60 ml  Net -3.18 ml   Constitutional: Chronically ill-appearing, lying in bed, no distress ENMT: ears and nose without scars or lesions, MMM CV: normal rate, trace edema in the lower extremities Respiratory: Bilateral chest rise, normal work of breathing Gastrointestinal: soft, non-tender, no palpable masses or hernias Skin: Vitiligo present, otherwise no visible lesions or rashes Psych: alert, appropriate mood and affect   Test Results I personally reviewed new and old clinical labs and radiology tests Lab Results  Component Value Date   NA 129 (L) 12/16/2021   K 5.5 (H) 12/16/2021   CL 92 (L) 12/16/2021   CO2 20 (L) 12/16/2021   BUN 64 (H) 12/16/2021   CREATININE 8.66 (H) 12/16/2021   CALCIUM 8.0 (L) 12/16/2021   ALBUMIN 2.6 (L) 12/16/2021   PHOS 5.5 (H) 12/14/2021

## 2021-12-16 NOTE — Progress Notes (Signed)
[facility] [place] Progress Note Patient Name: Cathy [name] DOB: 05-23-1981 MRN: [id number]   Date of Service  12/16/2021  HPI/Events of Note  Patient needs orders for AM CXR and labs.  eICU Interventions  AM CXR, CBC with Diff, BMP ordered.        Kerry Kass Debby Clyne 12/16/2021, 9:16 PM

## 2021-12-16 NOTE — Progress Notes (Signed)
Pts Qtc today is 0.48. Spoke w/ pharmacist who states pt not on any meds that would prolong.

## 2021-12-16 NOTE — Progress Notes (Signed)
Pt scheduled for HD today. Per HD charge RN, unable to come to ICU to perform HD until night shift. Pt has progressive care transfer orders.  Spoke w/ Hospitalist who states pt is ok to travel to HD.  Report given by night shift RN.

## 2021-12-16 NOTE — Progress Notes (Signed)
RN traveled w/ pt to HD, chest tube put to water seal. Follow up report given to receiving RN at bedside.

## 2021-12-16 NOTE — Progress Notes (Signed)
Pt receives out-pt HD at [facility] on TTS. Pt arrives at 7:45 for 8:00 chair time. Will assist as needed.   Melven Sartorius Renal Navigator [phone]

## 2021-12-16 NOTE — Progress Notes (Signed)
NAME:  MIDGE MOMON, MRN:  [id number], DOB:  October 19, 1981, LOS: 3 ADMISSION DATE:  12/13/2021, CONSULTATION DATE: 12/13/2021 REFERRING MD: Dr. Matilde Sprang, CHIEF COMPLAINT: Sepsis  History of Present Illness:  HPI obtained mostly from chart review as patient is able to give limited information, on biPAP and poor historian.     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  Unclear when fever started or if productive cough.  Last iHD on 1/12 with full treatment.     In ER, febrile up to 103, hypertensive, tachycardic, tachpneic, and sats 98% on baseline 2L.  Labs noted for WBC 12K, Hgb 7.4, Na 132, sCr 6.53, Alk phos 341, albumin 3.1, AST 76, trop hs 88 > 95, and BNP > 4.5K (has been elevated > 4.5K since 2021), normal coags.  Venous/ mixed gas 7.5/ 38/ 52/ 30.  CXR noted for bibasilar densities, R pleural effusion > L, and with stable but enlarged cardiac silhouette.  Underwent CTA PE which was negative for PE but noted ectasia of main pulmonary artery suggestive of PAH and large patchy infiltrate of RLL and small patchy infiltrate in RLL, with moderate R pleural effusion, small L pleural effusion, and moderate to large pericardial effusion.  CT abd/ pelvis noted for moderate volume intraperitoneal free fluid, and small benign appearing cyst in pancreas.  She was placed on BiPAP to help with work of breathing.  Cultures sent and empirically started on vancomycin and cefepime.  Nephrology consulted in ER with plans for iHD later this evening.  PCCM called for admit.  Pertinent  Medical History  Systolic and diastolic HF (6/56 EF 81-27%), HTN, ESRD TTS iHD 2/2 SLE (on iHD for ~1 yr), bipolar, anxiety/ depression  - MSSA  bacteremia 2/2 infected line in 05/2021  Significant Hospital Events: Including procedures, antibiotic start and stop dates in addition to other pertinent events   1/13 admitted, RML/ RLL PNA, sepsis, complicated R pleural effusion, acute on chronic HF 1/13 cefepime / vanc  1/13 Bcx2 >  1/13 SARS/ flu  > neg 1/13 MRSA PCR > Pos 1/13 R pleural fluid > exudate, culture negative 1/15 Hospitalist assumed care  Interim History / Subjective:  No acute events overnight No complaints this morning.   Currently off her oxygen with O2 sats 97%  Objective   Blood pressure (!) 137/106, pulse 77, temperature 98.8 F (37.1 C), temperature source Oral, resp. rate (!) 26, height _0  (1.702 m), weight 53 kg, SpO2 94 %, unknown if currently breastfeeding.    FiO2 (%):  [30 %] 30 %   Intake/Output Summary (Last 24 hours) at 12/16/2021 0732 Last data filed at 12/16/2021 0600 Gross per 24 hour  Intake 56.82 ml  Output 60 ml  Net -3.18 ml   Filed Weights   12/14/21 0615 12/15/21 0500 12/16/21 0500  Weight: 53.2 kg 53.2 kg 53 kg    Examination: General: Chronically ill middle-aged woman.  NAD. HENT: Dry mucous membrane. Lungs: CTAB.  No wheezing or rales. Cardiovascular: RRR.  No murmurs rubs or gallops. Abdomen: Soft.  NT/ND.  Normal BS Extremities: Warm and well perfused. Skin: Warm and dry.  Lupus skin changes Neuro: Alert and oriented x3.  Moves all extremities.  Resolved Hospital Problem list   Acute hypoxic respiratory failure Sepsis Assessment & Plan:  Acute on chronic hypoxic respiratory failure Pneumonia with pneumonic effusion S/p Chest tube placement 1/13 Respiratory status remained stable. Chest tube output down to 60 cc in the last 24 hours.  Pain is well managed. Pleural fluid cultures remain negative x2 days. White count trending down. Repeat CXR unchanged from yesterday but overall decreasing pleural effusion and opacities. --Remove chest tube today --Discontinue  vanc --Continue Cefepime for 2 more days --Bcx, pleural fluid culture negative x3 days --Sputum culture if able --Continue dilaudid 2 mg q6hr prn for moderate pain  PCCM will sign off  Best Practice (right click and "Reselect all SmartList Selections" daily)   Diet/type: Regular consistency (see orders) DVT prophylaxis: prophylactic heparin  GI prophylaxis: N/A Lines: N/A Foley:  N/A Code Status:  full code Last date of multidisciplinary goals of care discussion [no family at bedside]  Labs   CBC: Recent Labs  Lab 12/13/21 1005 12/13/21 1136 12/14/21 0258 12/14/21 0913 12/16/21 0132  WBC 12.3*  --  25.2* 30.3* 21.8*  NEUTROABS 9.7*  --   --   --  19.3*  HGB 7.4* 8.5* 7.8* 8.7* 7.9*  HCT 24.7* 25.0* 25.0* 28.5* 25.9*  MCV 90.5  --  88.0 88.8 88.7  PLT 186  --  180 219 725    Basic Metabolic Panel: Recent Labs  Lab 12/13/21 1005 12/13/21 1136 12/14/21 0258 12/14/21 0913 12/16/21 0132  NA 132* 131* 133* 134* 129*  K 5.0 4.9 5.9* 4.6 5.5*  CL 92*  --  89* 95* 92*  CO2 26  --  26 23 20*  GLUCOSE 97  --  102* 94 101*  BUN 23*  --  41* 21* 64*  CREATININE 6.53*  --  8.18* 5.07* 8.66*  CALCIUM 8.8*  --  8.6* 8.9 8.0*  PHOS  --   --  7.3* 5.5*  --    GFR: Estimated Creatinine Clearance: 7.4 mL/min (A) (by C-G formula based on SCr of 8.66 mg/dL (H)). Recent Labs  Lab 12/13/21 1005 12/13/21 1214 12/14/21 0258 12/14/21 0913 12/16/21 0132  WBC 12.3*  --  25.2* 30.3* 21.8*  LATICACIDVEN 1.2 1.5  --   --   --     Liver Function Tests: Recent Labs  Lab 12/13/21 1005 12/13/21 1530 12/14/21 0258 12/14/21 0913 12/16/21 0132  AST 76*  --   --   --  48*  ALT 40  --   --   --  43  ALKPHOS 341*  --   --   --  255*  BILITOT 1.0  --   --   --  0.7  PROT 6.6 6.8  --   --  6.9  ALBUMIN 3.1*  --  2.6* 2.9* 2.6*   No results for input(s): LIPASE, AMYLASE in the last 168 hours. No results for input(s): AMMONIA in the last 168 hours.  ABG    Component Value  Date/Time   PHART 7.335 (L) 01/18/2021 2111   PCO2ART 45.7 01/18/2021 2111   PO2ART 150 (H) 01/18/2021 2111   HCO3 30.2 (H) 12/13/2021 1136   TCO2 31 12/13/2021 1136   ACIDBASEDEF 1.0 01/18/2021 2111   O2SAT 90.0 12/13/2021 1136     Coagulation Profile: Recent Labs  Lab 12/13/21 1005  INR 1.2    Cardiac Enzymes: No results for input(s): CKTOTAL, CKMB, CKMBINDEX, TROPONINI in the last 168 hours.  HbA1C: No results found for: HGBA1C  CBG:  Recent Labs  Lab 12/13/21 2319 12/14/21 0340 12/14/21 0708 12/14/21 1110 12/14/21 1502  GLUCAP 86 89 73 89 118*    Critical care time: 30

## 2021-12-16 NOTE — Progress Notes (Signed)
[facility]                                                                              Patient Demographics  Cathy [name], is a 41 y.o. 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  In ED, patient was febrile up to 103 F, hypertensive, tachycardic, tachypneic, O2 sats 98% on 2 L.  WBCs 12 K, hemoglobin 7.4.  Elevated alk phos 341.  BNP > 4.5k.  Chest x-ray showed bibasilar densities with right pleural effusion> left and cardiomegaly. CTA chest negative for PE, showed large fatty infiltrate of RLL, moderate right-sided pleural effusion, small left pleural effusion, moderate to large pericardial effusion, PAH. Patient was placed on BiPAP, IV antibiotics Underwent chest tube placement on 12/13/21, CCM following for chest tube  Patient transferred to [facility], assumed care on 1/15   Assessment & Plan    Principal Problem:   Acute on chronic respiratory failure with hypoxia ([place])  -Secondary to bilateral pleural effusion, pneumonia, volume overload.  Patient presented with acute on chronic respiratory failure with hypoxia, was placed on BiPAP.  At baseline on 2 L O2 via [place] -O2 sat stable, 95% on 2 L   Active Problems: Right lower lobe pneumonia with parapneumonic effusion -Chest tube was placed on 12/13/2021, 200 cc of light brown fluid out, pleural fluid  analysis shows likely exudative effusion.  MRSA screen positive. -Follow pleural fluid cultures, so far NTD, blood cultures NTD -PCCM planning to remove chest tube today, recommended continue cefepime for 2 more days   ESRD on HD, TTS, hyperkalemia -Nephrology following, hemodialysis per patient schedule -Plan hemodialysis today per her schedule    Lupus (systemic lupus erythematosus) ([place]) -Not on any medications, chronic and stable    Pericardial effusion, combined systolic and diastolic CHF ([facility]) -Repeat echo showed EF of 25 to 30%, LV global hypokinesis, mild LVH, G2 DD, severely dilated LA, moderately dilated RA and moderate pericardial effusion.  No cardiac tamponade. -Symptoms including dyspnea, lower extremity edema improved with HD -Continue Coreg    Sepsis ([place]) -Patient met sepsis criteria at the time of admission with fevers, tachypnea, hypoxia, tachycardia, leukocytosis, source likely due to parapneumonic effusion, pneumonia -Continue IV antibiotics, sepsis physiology resolved  Anemia of chronic disease -Hemoglobin 7.9, follow closely, transfusion per nephrology, currently on Aranesp -Hold IV iron in the setting of infection,  Leukocytosis -Trending down, currently on IV vancomycin and cefepime  Moderate protein calorie malnutrition, hypoalbuminemia 2.9 Estimated body mass  index is 19.37 kg/m as calculated from the following:   Height as of this encounter: 5' 7" (1.702 m).   Weight as of this encounter: 56.1 kg.  Code Status: Full code DVT Prophylaxis:  heparin injection 5,000 Units Start: 12/13/21 2000 SCDs Start: 12/13/21 1459   Level of Care: Level of care: Med-Surg Family Communication: Discussed all imaging results, lab results, explained to the patient    Disposition Plan:     Status is: Inpatient  Remains inpatient appropriate because: PCCM planning to remove chest tube today, HD planned today possible DC in next 24 hours if remains stable  Time  Spent in minutes 35 minutes  Procedures:  BiPAP Hemodialysis Chest tube placement  Consultants:   Pulmonary critical care Nephrology  Antimicrobials:   Anti-infectives (From admission, onward)    Start     Dose/Rate Route Frequency Ordered Stop   12/16/21 1800  ceFEPIme (MAXIPIME) 1 g in sodium chloride 0.9 % 100 mL IVPB        1 g 200 mL/hr over 30 Minutes Intravenous Daily 12/16/21 1000 12/18/21 1759   12/14/21 1800  ceFEPIme (MAXIPIME) 1 g in sodium chloride 0.9 % 100 mL IVPB  Status:  Discontinued        1 g 200 mL/hr over 30 Minutes Intravenous Every 24 hours 12/13/21 1127 12/16/21 0958   12/14/21 0845  vancomycin (VANCOREADY) IVPB 500 mg/100 mL        500 mg 100 mL/hr over 60 Minutes Intravenous  Once 12/14/21 0749 12/14/21 1039   12/13/21 1130  ceFEPIme (MAXIPIME) 2 g in sodium chloride 0.9 % 100 mL IVPB  Status:  Discontinued        2 g 200 mL/hr over 30 Minutes Intravenous  Once 12/13/21 1122 12/13/21 1127   12/13/21 1130  vancomycin (VANCOCIN) IVPB 1000 mg/200 mL premix        1,000 mg 200 mL/hr over 60 Minutes Intravenous  Once 12/13/21 1127 12/13/21 1450   12/13/21 1130  ceFEPIme (MAXIPIME) 2 g in sodium chloride 0.9 % 100 mL IVPB        2 g 200 mL/hr over 30 Minutes Intravenous  Once 12/13/21 1127 12/13/21 1206   12/13/21 1128  vancomycin variable dose per unstable renal function (pharmacist dosing)  Status:  Discontinued         Does not apply See admin instructions 12/13/21 1128 12/16/21 0958   12/13/21 1115  metroNIDAZOLE (FLAGYL) IVPB 500 mg  Status:  Discontinued        500 mg 100 mL/hr over 60 Minutes Intravenous Every 12 hours 12/13/21 1105 12/13/21 1700          Medications  Scheduled Meds:  carvedilol  12.5 mg Oral BID WC   Chlorhexidine Gluconate Cloth  6 each Topical Q0600   [START ON 12/21/2021] darbepoetin (ARANESP) injection - DIALYSIS  200 mcg Intravenous Q Sat-HD   doxercalciferol  4 mcg Intravenous Q T,Th,Sa-HD   heparin  5,000 Units  Subcutaneous Q8H   hydrALAZINE  25 mg Oral Q8H   hydrocerin   Topical BID   mouth rinse  15 mL Mouth Rinse BID   mupirocin ointment  1 application Nasal BID   scopolamine  1 patch Transdermal Q72H   sodium chloride flush  10 mL Other Q8H   sodium chloride flush  10-40 mL Intracatheter Q12H   Continuous Infusions:  ceFEPime (MAXIPIME) IV     PRN Meds:.acetaminophen, diphenhydrAMINE, docusate sodium, hydrALAZINE, HYDROmorphone, hydrOXYzine, methocarbamol, morphine injection, polyethylene glycol, sodium  chloride flush, white petrolatum      Subjective:   Cathy [name] was seen and examined today.  Itching is better with Benadryl.  No acute complaints.  BP somewhat uncontrolled pending HD today.   Objective:   Vitals:   12/16/21 1100 12/16/21 1130 12/16/21 1200 12/16/21 1230  BP: (!) 148/111 (!) 131/102 (!) 153/126 (!) 164/104  Pulse: 61 66 67 70  Resp: 20 15 (!) 23 18  Temp:      TempSrc:      SpO2:      Weight:      Height:        Intake/Output Summary (Last 24 hours) at 12/16/2021 1346 Last data filed at 12/16/2021 1300 Gross per 24 hour  Intake 296.82 ml  Output 3060 ml  Net -2763.18 ml     Wt Readings from Last 3 Encounters:  12/16/21 56.1 kg  06/15/21 65.8 kg  06/10/21 68 kg   Physical Exam General: Alert and oriented x 3, NAD Cardiovascular: S1 S2 clear, RRR. No pedal edema b/l Respiratory: Diminished breath sound at bases, + chest tube Gastrointestinal: Soft, nontender, nondistended, NBS Ext: no pedal edema bilaterally Psych: Normal affect and demeanor, alert and oriented x3     Data Reviewed:  I have personally reviewed following labs and imaging studies  Micro Results Recent Results (from the past 240 hour(s))  Resp Panel by RT-PCR (Flu A&B, Covid) Nasopharyngeal Swab     Status: None   Collection Time: 12/13/21  9:55 AM   Specimen: Nasopharyngeal Swab; Nasopharyngeal(NP) swabs in vial transport medium  Result Value Ref Range Status   SARS  Coronavirus 2 by RT PCR NEGATIVE NEGATIVE Final    Comment: (NOTE) SARS-CoV-2 target nucleic acids are NOT DETECTED.  The SARS-CoV-2 RNA is generally detectable in upper respiratory specimens during the acute phase of infection. The lowest concentration of SARS-CoV-2 viral copies this assay can detect is 138 copies/mL. A negative result does not preclude SARS-Cov-2 infection and should not be used as the sole basis for treatment or other patient management decisions. A negative result may occur with  improper specimen collection/handling, submission of specimen other than nasopharyngeal swab, presence of viral mutation(s) within the areas targeted by this assay, and inadequate number of viral copies(<138 copies/mL). A negative result must be combined with clinical observations, patient history, and epidemiological information. The expected result is Negative.  Fact Sheet for Patients:  EntrepreneurPulse.com.au  Fact Sheet for Healthcare Providers:  IncredibleEmployment.be  This test is no t yet approved or cleared by the [place] FDA and  has been authorized for detection and/or diagnosis of SARS-CoV-2 by FDA under an Emergency Use Authorization (EUA). This EUA will remain  in effect (meaning this test can be used) for the duration of the COVID-19 declaration under Section 564(b)(1) of the Act, 21 U.S.C.section 360bbb-3(b)(1), unless the authorization is terminated  or revoked sooner.       Influenza A by PCR NEGATIVE NEGATIVE Final   Influenza B by PCR NEGATIVE NEGATIVE Final    Comment: (NOTE) The Xpert Xpress SARS-CoV-2/FLU/RSV plus assay is intended as an aid in the diagnosis of influenza from Nasopharyngeal swab specimens and should not be used as a sole basis for treatment. Nasal washings and aspirates are unacceptable for Xpert Xpress SARS-CoV-2/FLU/RSV testing.  Fact Sheet for  Patients: EntrepreneurPulse.com.au  Fact Sheet for Healthcare Providers: IncredibleEmployment.be  This test is not yet approved or cleared by the [place] FDA and has been authorized for detection and/or diagnosis  of SARS-CoV-2 by FDA under an Emergency Use Authorization (EUA). This EUA will remain in effect (meaning this test can be used) for the duration of the COVID-19 declaration under Section 564(b)(1) of the Act, 21 U.S.C. section 360bbb-3(b)(1), unless the authorization is terminated or revoked.  Performed at [facility], [place] [address], [place], [place] [address]   Blood Culture (routine x 2)     Status: None (Preliminary result)   Collection Time: 12/13/21 10:00 AM   Specimen: BLOOD  Result Value Ref Range Status   Specimen Description BLOOD RIGHT ANTECUBITAL  Final   Special Requests   Final    BOTTLES DRAWN AEROBIC AND ANAEROBIC Blood Culture results may not be optimal due to an excessive volume of blood received in culture bottles   Culture   Final    NO GROWTH 3 DAYS Performed at [facility], [place] [address], [place], [place] [address]    Report Status PENDING  Incomplete  Blood Culture (routine x 2)     Status: None (Preliminary result)   Collection Time: 12/13/21 10:05 AM   Specimen: BLOOD  Result Value Ref Range Status   Specimen Description BLOOD LEFT ANTECUBITAL  Final   Special Requests   Final    BOTTLES DRAWN AEROBIC AND ANAEROBIC Blood Culture adequate volume   Culture   Final    NO GROWTH 3 DAYS Performed at [facility], [place] [address], [place], [place] [address]    Report Status PENDING  Incomplete  Pleural fluid culture w Gram Stain     Status: None   Collection Time: 12/13/21  3:14 PM   Specimen: Pleural Fluid  Result Value Ref Range Status   Specimen Description PLEURAL  Final   Special Requests Normal  Final   Gram Stain   Final    ABUNDANT WBC PRESENT, PREDOMINANTLY PMN NO  ORGANISMS SEEN    Culture   Final    NO GROWTH 3 DAYS Performed at [facility], [place] [address] [place], [place] [address]    Report Status 12/16/2021 FINAL  Final  MRSA Next Gen by PCR, Nasal     Status: Abnormal   Collection Time: 12/13/21  4:24 PM   Specimen: Nasal Mucosa; Nasal Swab  Result Value Ref Range Status   MRSA by PCR Next Gen DETECTED (A) NOT DETECTED Final    Comment: RESULT CALLED TO, READ BACK BY AND VERIFIED WITH: BECKY CUMMIUNGS 12/13/2021 _0  BY JW (NOTE) The GeneXpert MRSA Assay (FDA approved for NASAL specimens only), is one component of a comprehensive MRSA colonization surveillance program. It is not intended to diagnose MRSA infection nor to guide or monitor treatment for MRSA infections. Test performance is not FDA approved in patients less than 35 years old. Performed at [facility], [place] [address], [place], [place] [address]     Radiology Reports CT Angio Chest PE W and/or Wo Contrast  Result Date: 12/13/2021 CLINICAL DATA:  Shortness of breath, chest pain, epigastric pain EXAM: CT ANGIOGRAPHY CHEST WITH CONTRAST TECHNIQUE: Multidetector CT imaging of the chest was performed using the standard protocol during bolus administration of intravenous contrast. Multiplanar CT image reconstructions and MIPs were obtained to evaluate the vascular anatomy. RADIATION DOSE REDUCTION: This exam was performed according to the departmental dose-optimization program which includes automated exposure control, adjustment of the mA and/or kV according to patient size and/or use of iterative reconstruction technique. CONTRAST:  176m OMNIPAQUE IOHEXOL 350 MG/ML SOLN COMPARISON:  02/01/2019 FINDINGS: Cardiovascular: Heart is enlarged in size. There is moderate to large pericardial effusion. There is no contrast enhancement in thoracic aorta limiting evaluation of the lumen of thoracic aorta. There are no intraluminal filling defects in central pulmonary artery  branches. Evaluation of small subsegmental peripheral branches in the lower lung fields is limited by infiltrates. There is ectasia of main pulmonary artery measuring 4 cm. There is no evidence of right ventricular strain. Mediastinum/Nodes: No significant lymphadenopathy seen. Lungs/Pleura: There is moderate right pleural effusion. There is small left pleural effusion. Large patchy alveolar infiltrate is seen in right lower lobe. There are smaller patchy infiltrates in right middle lobe and left lower lobe. Upper Abdomen: Unremarkable. Musculoskeletal: Unremarkable. Review of the MIP images confirms the above findings. IMPRESSION: There is no evidence of central pulmonary artery embolism. Evaluation of small peripheral subsegmental branches in the lower lobes is limited by infiltrates. There is ectasia of main pulmonary artery suggesting pulmonary arterial hypertension. There is large patchy alveolar infiltrate in the right lower lobe. Small patchy alveolar infiltrates are seen in the right middle lobe and left lower lobe. Findings suggest possible multifocal pneumonia. Moderate right pleural effusion. Small left pleural effusion. Moderate to large pericardial effusion. Electronically Signed   By: Elmer Picker M.D.   On: 12/13/2021 13:44   CT ABDOMEN PELVIS W CONTRAST  Result Date: 12/13/2021 CLINICAL DATA:  Abdominal pain.  Patient unable to lie flat EXAM: CT ABDOMEN AND PELVIS WITH CONTRAST TECHNIQUE: Multidetector CT imaging of the abdomen and pelvis was performed using the standard protocol following bolus administration of intravenous contrast. RADIATION DOSE REDUCTION: This exam was performed according to the departmental dose-optimization program which includes automated exposure control, adjustment of the mA and/or kV according to patient size and/or use of iterative reconstruction technique. CONTRAST:  161m OMNIPAQUE IOHEXOL 350 MG/ML SOLN COMPARISON:  Chest CT same day FINDINGS: Lower chest:  RIGHT lobe pneumonia. Large RIGHT effusion with passive atelectasis in the RIGHT lower lobe. Cardiomegaly Hepatobiliary: No enhancing hepatic lesion. Portal veins are patent. There is periportal edema. Small fluid around the liver. Gallbladder normal. Pancreas: small cyst in the pancreas measures 6 mm (image 47/3). No worrisome characteristics. No communication with the pancreatic duct. No pancreatic duct dilatation. Spleen: Normal spleen Adrenals/urinary tract: Adrenal glands normal. Four enhancement of the renal parenchyma. No hydronephrosis. Ureters and bladder grossly normal. Bladder collapsed Stomach/Bowel: Stomach, small bowel normal. Colon rectosigmoid colon normal. Vascular/Lymphatic: Abdominal aorta is normal caliber. There is no retroperitoneal or periportal lymphadenopathy. No pelvic lymphadenopathy. Reproductive: Uterus and adnexa unremarkable. Other: Normal moderate volume of intraperitoneal free fluid there is prominent pelvis. Mild anasarca soft tissues Musculoskeletal: No aggressive osseous lesion. IMPRESSION: 1. RIGHT lower lobe pneumonia. 2. Moderate volume intraperitoneal free fluid, anasarca and pleural effusions suggest volume overload/congestive heart failure. 3. Poor renal enhancement consistent with renal insufficiency. 4. Small benign appearing cyst in the pancreas. Per consensus recommendation, consider follow-up MRI in 1 year. This recommendation follows ACR consensus guidelines: Management of Incidental Pancreatic Cysts: A White Paper of the ACR Incidental Findings Committee. [id number] Electronically Signed   By: SSuzy BouchardM.D.   On: 12/13/2021 13:51   DG CHEST PORT 1 VIEW  Result Date: 12/16/2021 CLINICAL DATA:  Chest tube, pleural effusion EXAM: PORTABLE CHEST 1 VIEW COMPARISON:  12/15/2021 FINDINGS: No significant change in AP portable chest radiograph. Pigtail chest tube remains about the right lung base with associated small effusion and atelectasis  or consolidation. No significant pneumothorax.  Probable small left pleural effusion and or retrocardiac atelectasis. Cardiomegaly. Large-bore right neck multi lumen vascular catheter. IMPRESSION: 1. No significant change in AP portable chest radiograph. Pigtail chest tube remains about the right lung base with associated small effusion and atelectasis or consolidation. No significant pneumothorax. 2. Probable small left pleural effusion and or retrocardiac atelectasis. 3.  Cardiomegaly. Electronically Signed   By: Delanna Ahmadi M.D.   On: 12/16/2021 09:07   DG CHEST PORT 1 VIEW  Result Date: 12/15/2021 CLINICAL DATA:  Pleural effusion, difficulty breathing EXAM: PORTABLE CHEST 1 VIEW COMPARISON:  Previous studies including the examination of 12/14/2021 FINDINGS: Transverse diameter of heart is increased. There are no signs of pulmonary edema. Infiltrates are seen in both lower lung fields with interval partial clearing. Right chest tube is noted with its tip in the medial right lower lung fields. Tip of dialysis catheter is seen in the region right atrium. There is no pneumothorax. IMPRESSION: Cardiomegaly. There are no signs of pulmonary edema. There is interval improvement in aeration of both lower lung fields suggesting partial clearing of atelectasis/pneumonia and possibly decrease in pleural effusions. There is no pneumothorax. Electronically Signed   By: Elmer Picker M.D.   On: 12/15/2021 08:38   DG Chest Port 1 View  Result Date: 12/14/2021 CLINICAL DATA:  Chest tube placement, recheck right pneumothorax. EXAM: PORTABLE CHEST 1 VIEW COMPARISON:  Chest CT with contrast yesterday at 1:18 p.m., portable chest yesterday at 5:01 p.m. FINDINGS: Pigtail right pleural drain again has its tip in the medial base of the right chest. Minimal right apical pneumothorax estimated 3% or less continues to be noted and is unchanged. Right basilar airspace disease and at least a small pleural effusion continue to  be noted with dense left lower lobe consolidation still present and a small left effusion. The more cephalad lung fields are clear. There is a right IJ double-lumen catheter with the tip in the right atrium. There is cardiomegaly with normal caliber central vessels and stable mediastinum. IMPRESSION: There are no changes in the overall aeration from yesterday's most recent film. Minimal 3% or less right apical pneumothorax persists as well as right basilar airspace disease and small right effusion. Aeration on left is likewise unchanged with denser lower lobe consolidation and small effusion on this side as well. Chest tube positioning is unaltered. Electronically Signed   By: Telford Nab M.D.   On: 12/14/2021 03:54   DG Chest Port 1 View  Result Date: 12/13/2021 CLINICAL DATA:  Right pleural effusion.  Chest tube placement. EXAM: PORTABLE CHEST 1 VIEW COMPARISON:  12/13/2021 FINDINGS: A new right pleural pigtail catheter is seen in the lower right hemithorax, with decreased size of small right pleural effusion since prior study. A tiny less than 5% right apical pneumothorax is now seen. Airspace disease in the right lower lung shows no significant change. Small left pleural effusion and left retrocardiac atelectasis or infiltrate, also unchanged. Cardiomegaly stable. A right jugular dual-lumen central venous catheter is seen with tip overlying the mid right atrium. IMPRESSION: Decreased right pleural effusion following pleural drainage catheter placement. Tiny less than 5% right apical pneumothorax. Stable right lower lung airspace disease. Stable small left pleural effusion, and left basilar atelectasis versus infiltrate. Stable cardiomegaly. Electronically Signed   By: Marlaine Hind M.D.   On: 12/13/2021 17:29   DG Chest Port 1 View  Result Date: 12/13/2021 CLINICAL DATA:  Questionable sepsis.  Evaluate for abnormality. EXAM: PORTABLE CHEST 1 VIEW COMPARISON:  06/15/2021  FINDINGS: There is a right  jugular dialysis catheter with the tip extending into the right atrium. Bibasilar chest densities are compatible with pleural effusions and basilar consolidation or airspace disease. Pleural effusions are likely moderate in size particularly on the right side. Negative for pneumothorax. Cardiac silhouette appears to be enlarged and stable from the previous examination. IMPRESSION: New or enlarged bibasilar chest densities. Findings are suggestive for combination of pleural effusions with consolidation/airspace disease. Underlying pneumonia cannot be excluded. Dialysis catheter as described. Electronically Signed   By: Markus Daft M.D.   On: 12/13/2021 11:37   ECHOCARDIOGRAM COMPLETE  Result Date: 12/13/2021    ECHOCARDIOGRAM REPORT   Patient Name:   Cathy [name] Date of Exam: 12/13/2021 Medical Rec #:  [id number]        Height:       67.0 in Accession #:    [id number]       Weight:       146.2 lb Date of Birth:  Jan 23, 1981       BSA:          1.770 m Patient Age:    61 years         BP:           178/136 mmHg Patient Gender: F                HR:           93 bpm. Exam Location:  Inpatient Procedure: 2D Echo, Cardiac Doppler and Color Doppler Indications:    plureral effusion  History:        Patient has no prior history of Echocardiogram examinations.                 Signs/Symptoms:Dyspnea. GERD / PLEURAL EFFUSION.  Sonographer:    Beryle Beams Referring Phys: [id number] [place]  1. Left ventricular ejection fraction, by estimation, is 25 to 30%. The left ventricle has severely decreased function. The left ventricle demonstrates global hypokinesis. The left ventricular internal cavity size was mildly dilated. There is mild concentric left ventricular hypertrophy. Left ventricular diastolic parameters are consistent with Grade II diastolic dysfunction (pseudonormalization).  2. Right ventricular systolic function mildly-to-moderately reduced. The right ventricular size is mildly-to-moderately  enlarged. There is mildly elevated pulmonary artery systolic pressure. The estimated right ventricular systolic pressure is 99.8 mmHg.  3. Left atrial size was severely dilated.  4. Right atrial size was moderately dilated.  5. Moderate pericardial effusion. The pericardial effusion is circumferential. There is no evidence of cardiac tamponade.  6. The mitral valve is abnormal. There is mitral annular dilation with tethering of the posterior leaflet and mild mitral regurgitation.  7. Tricuspid valve regurgitation is moderate.  8. The aortic valve is tricuspid. There is mild thickening of the aortic valve. Aortic valve regurgitation is mild. No aortic stenosis is present.  9. The inferior vena cava is dilated in size with <50% respiratory variability, suggesting right atrial pressure of 15 mmHg. Comparison(s): Compared to prior TTE in 05/2021, there is no significant change. There continues to be biventricular dysfunction and a moderate pericardial effusion present without tamponade. FINDINGS  Left Ventricle: Left ventricular ejection fraction, by estimation, is 25 to 30%. The left ventricle has severely decreased function. The left ventricle demonstrates global hypokinesis. The left ventricular internal cavity size was mildly dilated. There is mild concentric left ventricular hypertrophy. Left ventricular diastolic parameters are consistent with Grade II diastolic dysfunction (pseudonormalization). Right Ventricle: The right ventricular size is  mildly-to-moderately enlarged. No increase in right ventricular wall thickness. Right ventricular systolic function mildly-to-moderately reduced. There is mildly elevated pulmonary artery systolic pressure. The tricuspid regurgitant velocity is 2.62 m/s, and with an assumed right atrial pressure of 10 mmHg, the estimated right ventricular systolic pressure is 12.8 mmHg. Left Atrium: Left atrial size was severely dilated. Right Atrium: Right atrial size was moderately dilated.  Pericardium: Measuring up to 1.5cm at end-diastole. A moderately sized pericardial effusion is present. The pericardial effusion is circumferential. There is no evidence of cardiac tamponade. Mitral Valve: The mitral valve is abnormal. There is mild thickening of the mitral valve leaflet(s). There is mild calcification of the mitral valve leaflet(s). There is mild mitral regurgitation. Tricuspid Valve: The tricuspid valve is normal in structure. Tricuspid valve regurgitation is moderate. Aortic Valve: The aortic valve is tricuspid. There is mild thickening of the aortic valve. Aortic valve regurgitation is mild. Aortic regurgitation PHT measures 462 msec. No aortic stenosis is present. Aortic valve mean gradient measures 4.0 mmHg. Aortic  valve peak gradient measures 6.1 mmHg. Aortic valve area, by VTI measures 1.18 cm. Pulmonic Valve: The pulmonic valve was normal in structure. Pulmonic valve regurgitation is mild. Aorta: The aortic root and ascending aorta are structurally normal, with no evidence of dilitation. Venous: The inferior vena cava is dilated in size with less than 50% respiratory variability, suggesting right atrial pressure of 15 mmHg. IAS/Shunts: The atrial septum is grossly normal.  LEFT VENTRICLE PLAX 2D LVIDd:         2.32 cm      Diastology LVIDs:         3.90 cm      LV e' medial:    6.20 cm/s LV PW:         1.50 cm      LV E/e' medial:  16.6 LV IVS:        3.36 cm      LV e' lateral:   8.81 cm/s LVOT diam:     1.70 cm      LV E/e' lateral: 11.7 LV SV:         20 LV SV Index:   11 LVOT Area:     2.27 cm  LV Volumes (MOD) LV vol d, MOD A2C: 132.0 ml LV vol d, MOD A4C: 117.0 ml LV vol s, MOD A2C: 98.8 ml LV vol s, MOD A4C: 107.0 ml LV SV MOD A2C:     33.2 ml LV SV MOD A4C:     117.0 ml LV SV MOD BP:      30.0 ml RIGHT VENTRICLE             IVC RV S prime:     11.30 cm/s  IVC diam: 2.20 cm TAPSE (M-mode): 2.0 cm LEFT ATRIUM              Index        RIGHT ATRIUM           Index LA diam:         4.30 cm  2.43 cm/m   RA Area:     14.10 cm LA Vol (A2C):   106.0 ml 59.89 ml/m  RA Volume:   33.40 ml  18.87 ml/m LA Vol (A4C):   73.0 ml  41.24 ml/m LA Biplane Vol: 89.6 ml  50.62 ml/m  AORTIC VALVE                    PULMONIC VALVE AV  Area (Vmax):    1.40 cm     PV Vmax:       0.69 m/s AV Area (Vmean):   1.19 cm     PV Vmean:      37.100 cm/s AV Area (VTI):     1.18 cm     PV VTI:        0.066 m AV Vmax:           123.00 cm/s  PV Peak grad:  1.9 mmHg AV Vmean:          91.700 cm/s  PV Mean grad:  1.0 mmHg AV VTI:            0.171 m AV Peak Grad:      6.1 mmHg AV Mean Grad:      4.0 mmHg LVOT Vmax:         75.80 cm/s LVOT Vmean:        48.200 cm/s LVOT VTI:          0.089 m LVOT/AV VTI ratio: 0.52 AI PHT:            462 msec  AORTA Ao Root diam: 2.70 cm Ao Asc diam:  2.30 cm MITRAL VALVE                TRICUSPID VALVE MV Area (PHT): 5.34 cm     TV Peak grad:   68.6 mmHg MV Decel Time: 142 msec     TV Mean grad:   54.0 mmHg MV E velocity: 103.00 cm/s  TV Vmax:        4.14 m/s MV A velocity: 60.70 cm/s   TV Vmean:       355.0 cm/s MV E/A ratio:  1.70         TV VTI:         1.26 msec                             TR Peak grad:   27.5 mmHg                             TR Vmax:        262.00 cm/s                              SHUNTS                             Systemic VTI:  0.09 m                             Systemic Diam: 1.70 cm Gwyndolyn Kaufman MD Electronically signed by Gwyndolyn Kaufman MD Signature Date/Time: 12/13/2021/6:58:25 PM    Final     Lab Data:  CBC: Recent Labs  Lab 12/13/21 1005 12/13/21 1136 12/14/21 0258 12/14/21 0913 12/16/21 0132  WBC 12.3*  --  25.2* 30.3* 21.8*  NEUTROABS 9.7*  --   --   --  19.3*  HGB 7.4* 8.5* 7.8* 8.7* 7.9*  HCT 24.7* 25.0* 25.0* 28.5* 25.9*  MCV 90.5  --  88.0 88.8 88.7  PLT 186  --  180 219 481   Basic Metabolic Panel: Recent Labs  Lab 12/13/21 1005 12/13/21 1136 12/14/21 0258 12/14/21 0913 12/16/21 0132  NA 132* 131* 133* 134* 129*  K 5.0  4.9 5.9* 4.6 5.5*  CL 92*  --  89* 95* 92*  CO2 26  --  26 23 20*  GLUCOSE 97  --  102* 94 101*  BUN 23*  --  41* 21* 64*  CREATININE 6.53*  --  8.18* 5.07* 8.66*  CALCIUM 8.8*  --  8.6* 8.9 8.0*  PHOS  --   --  7.3* 5.5*  --    GFR: Estimated Creatinine Clearance: 7.8 mL/min (A) (by C-G formula based on SCr of 8.66 mg/dL (H)). Liver Function Tests: Recent Labs  Lab 12/13/21 1005 12/13/21 1530 12/14/21 0258 12/14/21 0913 12/16/21 0132  AST 76*  --   --   --  48*  ALT 40  --   --   --  43  ALKPHOS 341*  --   --   --  255*  BILITOT 1.0  --   --   --  0.7  PROT 6.6 6.8  --   --  6.9  ALBUMIN 3.1*  --  2.6* 2.9* 2.6*   No results for input(s): LIPASE, AMYLASE in the last 168 hours. No results for input(s): AMMONIA in the last 168 hours. Coagulation Profile: Recent Labs  Lab 12/13/21 1005  INR 1.2   Cardiac Enzymes: No results for input(s): CKTOTAL, CKMB, CKMBINDEX, TROPONINI in the last 168 hours. BNP (last 3 results) No results for input(s): PROBNP in the last 8760 hours. HbA1C: No results for input(s): HGBA1C in the last 72 hours. CBG: Recent Labs  Lab 12/13/21 2319 12/14/21 0340 12/14/21 0708 12/14/21 1110 12/14/21 1502  GLUCAP 86 89 73 89 118*   Lipid Profile: No results for input(s): CHOL, HDL, LDLCALC, TRIG, CHOLHDL, LDLDIRECT in the last 72 hours. Thyroid Function Tests: No results for input(s): TSH, T4TOTAL, FREET4, T3FREE, THYROIDAB in the last 72 hours. Anemia Panel: No results for input(s): VITAMINB12, FOLATE, FERRITIN, TIBC, IRON, RETICCTPCT in the last 72 hours. Urine analysis:    Component Value Date/Time   COLORURINE YELLOW 11/29/2020 2054   APPEARANCEUR HAZY (A) 11/29/2020 2054   LABSPEC 1.015 11/29/2020 2054   PHURINE 6.0 11/29/2020 2054   GLUCOSEU NEGATIVE 11/29/2020 2054   HGBUR MODERATE (A) 11/29/2020 2054   BILIRUBINUR NEGATIVE 11/29/2020 2054   [place] 11/29/2020 2054   PROTEINUR >=300 (A) 11/29/2020 2054   UROBILINOGEN  0.2 10/17/2020 1026   NITRITE NEGATIVE 11/29/2020 2054   LEUKOCYTESUR NEGATIVE 11/29/2020 2054     Nyasiah Moffet M.D. [facility] 12/16/2021, 1:46 PM  Available via Epic secure chat 7am-7pm After 7 pm, please refer to night coverage provider listed on amion.

## 2021-12-17 ENCOUNTER — Inpatient Hospital Stay (HOSPITAL_COMMUNITY): Payer: Medicare Other

## 2021-12-17 DIAGNOSIS — J9600 Acute respiratory failure, unspecified whether with hypoxia or hypercapnia: Secondary | ICD-10-CM

## 2021-12-17 LAB — CBC WITH DIFFERENTIAL/PLATELET
Abs Immature Granulocytes: 0.2 10*3/uL — ABNORMAL HIGH (ref 0.00–0.07)
Basophils Absolute: 0 10*3/uL (ref 0.0–0.1)
Basophils Relative: 0 %
Eosinophils Absolute: 0.1 10*3/uL (ref 0.0–0.5)
Eosinophils Relative: 1 %
HCT: 24.1 % — ABNORMAL LOW (ref 36.0–46.0)
Hemoglobin: 7.7 g/dL — ABNORMAL LOW (ref 12.0–15.0)
Immature Granulocytes: 2 %
Lymphocytes Relative: 5 %
Lymphs Abs: 0.6 10*3/uL — ABNORMAL LOW (ref 0.7–4.0)
MCH: 27.8 pg (ref 26.0–34.0)
MCHC: 32 g/dL (ref 30.0–36.0)
MCV: 87 fL (ref 80.0–100.0)
Monocytes Absolute: 1.5 10*3/uL — ABNORMAL HIGH (ref 0.1–1.0)
Monocytes Relative: 13 %
Neutro Abs: 9.1 10*3/uL — ABNORMAL HIGH (ref 1.7–7.7)
Neutrophils Relative %: 79 %
Platelets: 262 10*3/uL (ref 150–400)
RBC: 2.77 MIL/uL — ABNORMAL LOW (ref 3.87–5.11)
RDW: 16 % — ABNORMAL HIGH (ref 11.5–15.5)
WBC: 11.5 10*3/uL — ABNORMAL HIGH (ref 4.0–10.5)
nRBC: 0.5 % — ABNORMAL HIGH (ref 0.0–0.2)

## 2021-12-17 LAB — BASIC METABOLIC PANEL
Anion gap: 16 — ABNORMAL HIGH (ref 5–15)
BUN: 37 mg/dL — ABNORMAL HIGH (ref 6–20)
CO2: 22 mmol/L (ref 22–32)
Calcium: 8.1 mg/dL — ABNORMAL LOW (ref 8.9–10.3)
Chloride: 92 mmol/L — ABNORMAL LOW (ref 98–111)
Creatinine, Ser: 5.85 mg/dL — ABNORMAL HIGH (ref 0.44–1.00)
GFR, Estimated: 9 mL/min — ABNORMAL LOW (ref >60)
Glucose, Bld: 92 mg/dL (ref 70–99)
Potassium: 4.1 mmol/L (ref 3.5–5.1)
Sodium: 130 mmol/L — ABNORMAL LOW (ref 135–145)

## 2021-12-17 LAB — EXPECTORATED SPUTUM ASSESSMENT W GRAM STAIN, RFLX TO RESP C

## 2021-12-17 LAB — PATHOLOGIST SMEAR REVIEW

## 2021-12-17 LAB — PHOSPHORUS: Phosphorus: 4.3 mg/dL (ref 2.5–4.6)

## 2021-12-17 MED ORDER — AMLODIPINE BESYLATE 10 MG PO TABS: 10.0000 mg | ORAL_TABLET | Freq: Every day | ORAL | 3 refills | Status: DC

## 2021-12-17 MED ORDER — HYDRALAZINE HCL 25 MG PO TABS: 25.0000 mg | ORAL_TABLET | Freq: Three times a day (TID) | ORAL | 3 refills | Status: DC

## 2021-12-17 MED ORDER — IPRATROPIUM BROMIDE HFA 17 MCG/ACT IN AERS: 2.0000 | INHALATION_SPRAY | Freq: Four times a day (QID) | RESPIRATORY_TRACT | 12 refills | Status: DC | PRN

## 2021-12-17 MED ORDER — HYDROXYZINE HCL 25 MG PO TABS: 25.0000 mg | ORAL_TABLET | Freq: Three times a day (TID) | ORAL | 0 refills | Status: DC | PRN

## 2021-12-17 MED ORDER — CARVEDILOL 12.5 MG PO TABS: 12.5000 mg | ORAL_TABLET | Freq: Two times a day (BID) | ORAL | 3 refills | Status: DC

## 2021-12-17 MED ORDER — HEPARIN SODIUM (PORCINE) 1000 UNIT/ML DIALYSIS
1000.0000 [IU] | INTRAMUSCULAR | Status: DC | PRN
  Administered 2021-12-17: 1000 [IU] via INTRAVENOUS_CENTRAL
  Filled 2021-12-17: qty 1

## 2021-12-17 MED ORDER — LIDOCAINE HCL (PF) 1 % IJ SOLN: 5.0000 mL | INTRAMUSCULAR | Status: DC | PRN

## 2021-12-17 MED ORDER — SODIUM CHLORIDE 0.9 % IV SOLN
2.0000 g | INTRAVENOUS | Status: DC
  Filled 2021-12-17: qty 2

## 2021-12-17 MED ORDER — ALTEPLASE 2 MG IJ SOLR: 2.0000 mg | Freq: Once | INTRAMUSCULAR | Status: DC | PRN

## 2021-12-17 MED ORDER — SODIUM CHLORIDE 0.9 % IV SOLN: 100.0000 mL | INTRAVENOUS | Status: DC | PRN

## 2021-12-17 MED ORDER — METHOCARBAMOL 500 MG PO TABS: 500.0000 mg | ORAL_TABLET | Freq: Four times a day (QID) | ORAL | 0 refills | Status: DC | PRN

## 2021-12-17 MED ORDER — ALBUTEROL SULFATE HFA 108 (90 BASE) MCG/ACT IN AERS: 2.0000 | INHALATION_SPRAY | Freq: Four times a day (QID) | RESPIRATORY_TRACT | 1 refills | Status: DC | PRN

## 2021-12-17 MED ORDER — LIDOCAINE-PRILOCAINE 2.5-2.5 % EX CREA: 1.0000 "application " | TOPICAL_CREAM | CUTANEOUS | Status: DC | PRN

## 2021-12-17 MED ORDER — PENTAFLUOROPROP-TETRAFLUOROETH EX AERO: 1.0000 "application " | INHALATION_SPRAY | CUTANEOUS | Status: DC | PRN

## 2021-12-17 NOTE — Progress Notes (Signed)
Nephrology Follow-Up Consult note   Assessment/Recommendations: Cathy [name] is a/an 41 y.o. female with a past medical history significant for SLE now ESRD, admitted for PNA and AHRF.      OP HD: [place] [facility]  4h   2/2 bath  56kg  Hep none   R TDC  - hect 5 ug tiw  - mircera 225 last 12/22  - venofer 100mg  , 6 more doses   ESRD: TTS outpatient. Tolerated HD yesterday. Plan to resume regular tts schedule today.  AHRF/PNA: abx per primary team. R chest tube now removed. Fairly aggressive UF with HD. Under EDW; will need to be adjusted outpatient at discharge.  SLE: essentially burnt out at this point. No on any meds.  HTN: watch after HD and UF. 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 Holding iron given infection  Secondary hyperparathyroidism: Continue hecterol. No binder currently  Hyponatremia: Related to ESRD.  Dialysis as above     Recommendations conveyed to primary service.    [facility] 12/17/2021 8:53 AM  ___________________________________________________________  CC: ESRD, hypoxia  Interval History/Subjective: Feels well with no complaints today. Chest tube removed.   Medications:  Current Facility-Administered Medications  Medication Dose Route Frequency Provider Last Rate Last Admin   acetaminophen (TYLENOL) tablet 650 mg  650 mg Oral Q6H PRN Lacinda Axon, MD   650 mg at 12/16/21 2138   carvedilol (COREG) tablet 12.5 mg  12.5 mg Oral BID WC Hunsucker, Bonna Gains, MD   12.5 mg at 12/16/21 1832   ceFEPIme (MAXIPIME) 1 g in sodium chloride 0.9 % 100 mL IVPB  1 g Intravenous Daily Spero Geralds, MD   Stopped at 12/16/21 1903   Chlorhexidine Gluconate Cloth 2 % PADS 6 each  6 each Topical Q0600 Roney Jaffe, MD   6 each at 12/16/21 2200   [START ON 12/21/2021] Darbepoetin Alfa (ARANESP) injection 200 mcg  200 mcg Intravenous Q Sat-HD Ueland, Emma M, RPH       diphenhydrAMINE (BENADRYL) injection 12.5  mg  12.5 mg Intravenous Q6H PRN Rai, Ripudeep K, MD   12.5 mg at 12/16/21 2138   docusate sodium (COLACE) capsule 100 mg  100 mg Oral BID PRN Jennelle Human B, NP   100 mg at 12/15/21 0602   doxercalciferol (HECTOROL) injection 4 mcg  4 mcg Intravenous Q T,Th,Sa-HD Roney Jaffe, MD       heparin injection 5,000 Units  5,000 Units Subcutaneous Q8H Jennelle Human B, NP   5,000 Units at 12/17/21 0542   hydrALAZINE (APRESOLINE) injection 10 mg  10 mg Intravenous Q4H PRN Jennelle Human B, NP       hydrALAZINE (APRESOLINE) tablet 25 mg  25 mg Oral Q8H Hunsucker, Bonna Gains, MD   25 mg at 12/17/21 0542   hydrocerin (EUCERIN) cream   Topical BID Lacinda Axon, MD   Given at 12/16/21 2144   HYDROmorphone (DILAUDID) tablet 2 mg  2 mg Oral Q6H PRN Lacinda Axon, MD   2 mg at 12/16/21 1054   hydrOXYzine (ATARAX) tablet 25 mg  25 mg Oral TID PRN Rai, Vernelle Emerald, MD   25 mg at 12/17/21 0314   MEDLINE mouth rinse  15 mL Mouth Rinse BID Hunsucker, Bonna Gains, MD   15 mL at 12/16/21 2144   methocarbamol (ROBAXIN) tablet 500 mg  500 mg Oral Q6H PRN Anders Simmonds, MD   500 mg at 12/14/21 2040   morphine 2  MG/ML injection 2 mg  2 mg Intravenous Q3H PRN Jennelle Human B, NP   2 mg at 12/16/21 0020   mupirocin ointment (BACTROBAN) 2 % 1 application  1 application Nasal BID Hunsucker, Bonna Gains, MD   1 application at 24/23/53 2144   polyethylene glycol (MIRALAX / GLYCOLAX) packet 17 g  17 g Oral Daily PRN Jennelle Human B, NP       sodium chloride flush (NS) 0.9 % injection 10 mL  10 mL Other Q8H Jennelle Human B, NP   10 mL at 12/17/21 0834   sodium chloride flush (NS) 0.9 % injection 10-40 mL  10-40 mL Intracatheter Q12H Hunsucker, Bonna Gains, MD   10 mL at 12/16/21 6144   sodium chloride flush (NS) 0.9 % injection 10-40 mL  10-40 mL Intracatheter PRN Hunsucker, Bonna Gains, MD       white petrolatum (VASELINE) gel   Topical PRN Anders Simmonds, MD   Given at 12/14/21 2040      Review of Systems: 10  systems reviewed and negative except per interval history/subjective  Physical Exam: Vitals:   12/17/21 0800 12/17/21 0849  BP: 135/88   Pulse: 79   Resp: 16   Temp:    SpO2: 97% 96%   Total I/O In: 225 [I.V.:225] Out: -   Intake/Output Summary (Last 24 hours) at 12/17/2021 0853 Last data filed at 12/17/2021 0800 Gross per 24 hour  Intake 945.06 ml  Output 3060 ml  Net -2114.94 ml   Constitutional: Chronically ill-appearing, lying in bed, no distress ENMT: ears and nose without scars or lesions, MMM CV: normal rate, trace edema in the lower extremities Respiratory: Bilateral chest rise, normal work of breathing Gastrointestinal: soft, non-tender, no palpable masses or hernias Skin: Vitiligo present, otherwise no visible lesions or rashes Psych: alert, appropriate mood and affect   Test Results I personally reviewed new and old clinical labs and radiology tests Lab Results  Component Value Date   NA 130 (L) 12/17/2021   K 4.1 12/17/2021   CL 92 (L) 12/17/2021   CO2 22 12/17/2021   BUN 37 (H) 12/17/2021   CREATININE 5.85 (H) 12/17/2021   CALCIUM 8.1 (L) 12/17/2021   ALBUMIN 2.6 (L) 12/16/2021   PHOS 5.5 (H) 12/14/2021

## 2021-12-17 NOTE — Progress Notes (Signed)
Pharmacy Antibiotic Note  Cathy [name] is a 41 y.o. female admitted on 12/13/2021 with sepsis.  Pharmacy has been consulted for vancomycin/cefepime dosing.  ESRD on iHD TuThSa WBC down 11.5, afebrile  Plan: Continue cefepime 1g IV every 24 hours through today  Temp (24hrs), Avg:98.9 F (37.2 C), Min:98.2 F (36.8 C), Max:99.7 F (37.6 C)  Recent Labs  Lab 12/13/21 1005 12/13/21 1214 12/14/21 0258 12/14/21 0913 12/16/21 0132 12/17/21 0110  WBC 12.3*  --  25.2* 30.3* 21.8* 11.5*  CREATININE 6.53*  --  8.18* 5.07* 8.66* 5.85*  LATICACIDVEN 1.2 1.5  --   --   --   --      Estimated Creatinine Clearance: 10.4 mL/min (A) (by C-G formula based on SCr of 5.85 mg/dL (H)).    No Known Allergies  Antimicrobials this admission: Vanco 1/13  Cefepime 1/13 >> 1/17 Metronidazole 1/13  Dose adjustments this admission: none  Microbiology results: 1/13 BCx: NGTD 1/13 MRSA: positive  1/13 pleural fluid: NGF 1/14 sputum: needs recollect   Thank you for allowing pharmacy to be a part of this patients care.  Laurey Arrow, PharmD PGY1 Pharmacy Resident 12/17/2021  7:11 AM  Please check [email or web address] for unit-specific pharmacy phone numbers.

## 2021-12-17 NOTE — Discharge Summary (Signed)
Physician Discharge Summary   Patient: Cathy [name] MRN: [id number] DOB: 07/19/1981  Admit date:     12/13/2021  Discharge date: 12/17/21  Discharge Physician: Estill Cotta   PCP: Pcp, No   Recommendations at discharge:   Chest tube discontinued, patient has completed IV cefepime Counseled on compliance with medications  Discharge Diagnoses    Acute on chronic respiratory failure with hypoxia (HCC)-resolved   Lupus (systemic lupus erythematosus) (HCC)   Pericardial effusion   ESRD (end stage renal disease) (HCC)   HFrEF (heart failure with reduced ejection fraction) (HCC)   Sepsis (HCC)   Parapneumonic effusion status post chest tube, now removed     Hospital Course     Patient is a 41 year old female with systolic and diastolic CHF, HTN, 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   In ED, patient was febrile up to 103 F, hypertensive, tachycardic, tachypneic, O2 sats 98% on 2 L.  WBCs 12 K, hemoglobin 7.4.  Elevated alk phos 341.  BNP > 4.5k.  Chest x-ray showed bibasilar densities with right pleural effusion> left and cardiomegaly. CTA chest negative for PE, showed large fatty infiltrate of RLL, moderate right-sided pleural effusion, small left pleural effusion, moderate to large pericardial effusion, PAH. Patient was placed on BiPAP, IV antibiotics Underwent chest tube placement on 12/13/21, chest tube was removed on 12/16/2021   Acute on chronic respiratory failure with hypoxia ([place])  -Secondary to bilateral pleural effusion, pneumonia, volume overload.  Patient presented with acute on chronic respiratory failure with hypoxia, was placed on BiPAP.  At baseline on 2 L O2 via [place] -Now stable, O2 sats 95 200% on 2 L, at baseline      Right lower lobe pneumonia with  parapneumonic effusion -Chest tube was placed on 12/13/2021, 200 cc of light brown fluid out, pleural fluid analysis shows likely exudative effusion.  MRSA screen positive. - pleural fluid cultures, so far NTD, blood cultures NTD -Chest tube has been removed on 12/16, patient completing IV cefepime today     ESRD on HD, TTS, hyperkalemia -Nephrology following, hemodialysis per patient schedule -Patient receiving hemodialysis today per her schedule, next on 1/19     Lupus (systemic lupus erythematosus) ([place]) -Not on any medications, chronic and stable     Pericardial effusion, combined systolic and diastolic CHF (HCC) -Repeat echo showed EF of 25 to 30%, LV global hypokinesis, mild LVH, G2 DD, severely dilated LA, moderately dilated RA and moderate pericardial effusion.  No cardiac tamponade. -Symptoms including dyspnea, lower extremity edema improved with HD -Continue Coreg 7     Sepsis ([place]) -Patient met sepsis criteria at the time of admission with fevers, tachypnea, hypoxia, tachycardia, leukocytosis, source likely due to parapneumonic effusion, pneumonia -Sepsis physiology has resolved, completing IV cefepime today   Anemia of chronic disease -Hemoglobin 7.7, receiving IV iron as   Leukocytosis -Trending down, completing antibiotics today.  White count trended down to 11.5 today.  Chest tube has been removed  Essential hypertension -Uncontrolled, noncompliant, continue Coreg 12.5 mg p.o. twice daily, Norvasc 10 mg daily, hydralazine 25 mg 3 times daily, volume control with hemodialysis -Readjust antihypertensives outpatient   Moderate protein calorie malnutrition, hypoalbuminemia 2.9 Estimated body mass index is 19.37 kg/m as calculated from the following:   Height as of this encounter: 5' 7"  (1.702  m).   Weight as of this encounter: 56.1 kg.  Pain control - [employer] Controlled Substance Reporting System database was reviewed. and patient was instructed, not to drive,  operate heavy machinery, perform activities at heights, swimming or participation in water activities or provide baby-sitting services while on Pain, Sleep and Anxiety Medications; until their outpatient Physician has advised to do so again. Also recommended to not to take more than prescribed Pain, Sleep and Anxiety Medications.   Consultants:  Critical care Nephrology Procedures performed:  Chest tube placement, hemodialysis Disposition: Home Diet recommendation: Renal diet  DISCHARGE MEDICATION: Allergies as of 12/17/2021   No Known Allergies      Medication List     STOP taking these medications    BiDil 20-37.5 MG tablet Generic drug: isosorbide-hydrALAZINE   cloNIDine 0.3 MG tablet Commonly known as: CATAPRES   irbesartan 300 MG tablet Commonly known as: AVAPRO   pantoprazole 40 MG tablet Commonly known as: PROTONIX   torsemide 20 MG tablet Commonly known as: DEMADEX       TAKE these medications    (feeding supplement) PROSource Plus liquid Take 30 mLs by mouth 3 (three) times daily between meals.   acetaminophen 500 MG tablet Commonly known as: TYLENOL Take 1,000 mg by mouth every 6 (six) hours as needed for moderate pain.   albuterol 108 (90 Base) MCG/ACT inhaler Commonly known as: VENTOLIN HFA Inhale 2 puffs into the lungs every 6 (six) hours as needed for wheezing or shortness of breath. What changed:  when to take this reasons to take this   amLODipine 10 MG tablet Commonly known as: NORVASC Take 1 tablet (10 mg total) by mouth daily.   carvedilol 12.5 MG tablet Commonly known as: COREG Take 1 tablet (12.5 mg total) by mouth 2 (two) times daily with a meal. What changed:  medication strength how much to take   hydrALAZINE 25 MG tablet Commonly known as: APRESOLINE Take 1 tablet (25 mg total) by mouth 3 (three) times daily.   hydrOXYzine 25 MG tablet Commonly known as: ATARAX Take 1 tablet (25 mg total) by mouth 3 (three) times daily as  needed for itching or anxiety.   ipratropium 17 MCG/ACT inhaler Commonly known as: ATROVENT HFA Inhale 2 puffs into the lungs every 6 (six) hours as needed for wheezing.   methocarbamol 500 MG tablet Commonly known as: ROBAXIN Take 1 tablet (500 mg total) by mouth every 6 (six) hours as needed for muscle spasms.               Discharge Care Instructions  (From admission, onward)           Start     Ordered   12/17/21 0000  If the dressing is still on your incision site when you go home, remove it on the third day after your surgery date. Remove dressing if it begins to fall off, or if it is dirty or damaged before the third day.        12/17/21 1408            Follow-up Information     Lelon Perla, MD. Schedule an appointment as soon as possible for a visit in 2 week(s).   Specialty: Cardiology Contact information: [address] [place] [address] [phone]                 Discharge Exam: Filed Weights   12/17/21 0500 12/17/21 0849 12/17/21 1200  Weight: 50.3 kg  49.4 kg 46.4 kg   S: Asking to go home, seen during hemodialysis no fevers or chills.  Feels close to her baseline  BP (!) 145/104 (BP Location: Right Arm)    Pulse 76    Temp 99.8 F (37.7 C) (Oral)    Resp 16    Ht 5' 7"  (1.702 m)    Wt 46.4 kg    SpO2 97%    BMI 16.02 kg/m    Physical Exam General: Alert and oriented x 3, NAD Cardiovascular: S1 S2 clear, RRR. No pedal edema b/l Respiratory: CTAB, no wheezing, rales or rhonchi, dressing intact on the posterior chest.  Chest tube removed Gastrointestinal: Soft, nontender, nondistended, NBS Ext: no pedal edema bilaterally Psych: Normal affect and demeanor, alert and oriented x3    Condition at discharge: fair  The results of significant diagnostics from this hospitalization (including imaging, microbiology, ancillary and laboratory) are listed below for reference.   Imaging Studies: CT Angio Chest PE W  and/or Wo Contrast  Result Date: 12/13/2021 CLINICAL DATA:  Shortness of breath, chest pain, epigastric pain EXAM: CT ANGIOGRAPHY CHEST WITH CONTRAST TECHNIQUE: Multidetector CT imaging of the chest was performed using the standard protocol during bolus administration of intravenous contrast. Multiplanar CT image reconstructions and MIPs were obtained to evaluate the vascular anatomy. RADIATION DOSE REDUCTION: This exam was performed according to the departmental dose-optimization program which includes automated exposure control, adjustment of the mA and/or kV according to patient size and/or use of iterative reconstruction technique. CONTRAST:  166m OMNIPAQUE IOHEXOL 350 MG/ML SOLN COMPARISON:  02/01/2019 FINDINGS: Cardiovascular: Heart is enlarged in size. There is moderate to large pericardial effusion. There is no contrast enhancement in thoracic aorta limiting evaluation of the lumen of thoracic aorta. There are no intraluminal filling defects in central pulmonary artery branches. Evaluation of small subsegmental peripheral branches in the lower lung fields is limited by infiltrates. There is ectasia of main pulmonary artery measuring 4 cm. There is no evidence of right ventricular strain. Mediastinum/Nodes: No significant lymphadenopathy seen. Lungs/Pleura: There is moderate right pleural effusion. There is small left pleural effusion. Large patchy alveolar infiltrate is seen in right lower lobe. There are smaller patchy infiltrates in right middle lobe and left lower lobe. Upper Abdomen: Unremarkable. Musculoskeletal: Unremarkable. Review of the MIP images confirms the above findings. IMPRESSION: There is no evidence of central pulmonary artery embolism. Evaluation of small peripheral subsegmental branches in the lower lobes is limited by infiltrates. There is ectasia of main pulmonary artery suggesting pulmonary arterial hypertension. There is large patchy alveolar infiltrate in the right lower lobe.  Small patchy alveolar infiltrates are seen in the right middle lobe and left lower lobe. Findings suggest possible multifocal pneumonia. Moderate right pleural effusion. Small left pleural effusion. Moderate to large pericardial effusion. Electronically Signed   By: PElmer PickerM.D.   On: 12/13/2021 13:44   CT ABDOMEN PELVIS W CONTRAST  Result Date: 12/13/2021 CLINICAL DATA:  Abdominal pain.  Patient unable to lie flat EXAM: CT ABDOMEN AND PELVIS WITH CONTRAST TECHNIQUE: Multidetector CT imaging of the abdomen and pelvis was performed using the standard protocol following bolus administration of intravenous contrast. RADIATION DOSE REDUCTION: This exam was performed according to the departmental dose-optimization program which includes automated exposure control, adjustment of the mA and/or kV according to patient size and/or use of iterative reconstruction technique. CONTRAST:  1012mOMNIPAQUE IOHEXOL 350 MG/ML SOLN COMPARISON:  Chest CT same day FINDINGS: Lower chest: RIGHT lobe pneumonia. Large RIGHT effusion  with passive atelectasis in the RIGHT lower lobe. Cardiomegaly Hepatobiliary: No enhancing hepatic lesion. Portal veins are patent. There is periportal edema. Small fluid around the liver. Gallbladder normal. Pancreas: small cyst in the pancreas measures 6 mm (image 47/3). No worrisome characteristics. No communication with the pancreatic duct. No pancreatic duct dilatation. Spleen: Normal spleen Adrenals/urinary tract: Adrenal glands normal. Four enhancement of the renal parenchyma. No hydronephrosis. Ureters and bladder grossly normal. Bladder collapsed Stomach/Bowel: Stomach, small bowel normal. Colon rectosigmoid colon normal. Vascular/Lymphatic: Abdominal aorta is normal caliber. There is no retroperitoneal or periportal lymphadenopathy. No pelvic lymphadenopathy. Reproductive: Uterus and adnexa unremarkable. Other: Normal moderate volume of intraperitoneal free fluid there is prominent  pelvis. Mild anasarca soft tissues Musculoskeletal: No aggressive osseous lesion. IMPRESSION: 1. RIGHT lower lobe pneumonia. 2. Moderate volume intraperitoneal free fluid, anasarca and pleural effusions suggest volume overload/congestive heart failure. 3. Poor renal enhancement consistent with renal insufficiency. 4. Small benign appearing cyst in the pancreas. Per consensus recommendation, consider follow-up MRI in 1 year. This recommendation follows ACR consensus guidelines: Management of Incidental Pancreatic Cysts: A White Paper of the ACR Incidental Findings Committee. [place] 4742;[id number]. Electronically Signed   By: Suzy Bouchard M.D.   On: 12/13/2021 13:51   DG Chest Port 1 View  Result Date: 12/17/2021 CLINICAL DATA:  Acute respiratory failure. EXAM: PORTABLE CHEST 1 VIEW COMPARISON:  12/16/2021 FINDINGS: There is a right-sided dialysis catheter with tip in the cavoatrial junction. The right-sided chest tube has been removed. No pneumothorax visualized. Unchanged enlargement of the cardiac silhouette reflecting underlying pericardial effusion. Bilateral pleural effusions are again noted and appear unchanged. Right lower lobe airspace disease is unchanged. Atelectasis within the left base is also stable. IMPRESSION: 1. No change in bilateral pleural effusions with lower lobe airspace consolidation and atelectasis. 2. Stable enlargement of the cardiac silhouette reflecting underlying pericardial effusion. 3. Interval removal of right chest tube without visible pneumothorax. Electronically Signed   By: Kerby Moors M.D.   On: 12/17/2021 05:58   DG CHEST PORT 1 VIEW  Result Date: 12/16/2021 CLINICAL DATA:  Chest tube, pleural effusion EXAM: PORTABLE CHEST 1 VIEW COMPARISON:  12/15/2021 FINDINGS: No significant change in AP portable chest radiograph. Pigtail chest tube remains about the right lung base with associated small effusion and atelectasis or consolidation. No significant  pneumothorax. Probable small left pleural effusion and or retrocardiac atelectasis. Cardiomegaly. Large-bore right neck multi lumen vascular catheter. IMPRESSION: 1. No significant change in AP portable chest radiograph. Pigtail chest tube remains about the right lung base with associated small effusion and atelectasis or consolidation. No significant pneumothorax. 2. Probable small left pleural effusion and or retrocardiac atelectasis. 3.  Cardiomegaly. Electronically Signed   By: Delanna Ahmadi M.D.   On: 12/16/2021 09:07   DG CHEST PORT 1 VIEW  Result Date: 12/15/2021 CLINICAL DATA:  Pleural effusion, difficulty breathing EXAM: PORTABLE CHEST 1 VIEW COMPARISON:  Previous studies including the examination of 12/14/2021 FINDINGS: Transverse diameter of heart is increased. There are no signs of pulmonary edema. Infiltrates are seen in both lower lung fields with interval partial clearing. Right chest tube is noted with its tip in the medial right lower lung fields. Tip of dialysis catheter is seen in the region right atrium. There is no pneumothorax. IMPRESSION: Cardiomegaly. There are no signs of pulmonary edema. There is interval improvement in aeration of both lower lung fields suggesting partial clearing of atelectasis/pneumonia and possibly decrease in pleural effusions. There is no pneumothorax. Electronically Signed  By: Elmer Picker M.D.   On: 12/15/2021 08:38   DG Chest Port 1 View  Result Date: 12/14/2021 CLINICAL DATA:  Chest tube placement, recheck right pneumothorax. EXAM: PORTABLE CHEST 1 VIEW COMPARISON:  Chest CT with contrast yesterday at 1:18 p.m., portable chest yesterday at 5:01 p.m. FINDINGS: Pigtail right pleural drain again has its tip in the medial base of the right chest. Minimal right apical pneumothorax estimated 3% or less continues to be noted and is unchanged. Right basilar airspace disease and at least a small pleural effusion continue to be noted with dense left lower  lobe consolidation still present and a small left effusion. The more cephalad lung fields are clear. There is a right IJ double-lumen catheter with the tip in the right atrium. There is cardiomegaly with normal caliber central vessels and stable mediastinum. IMPRESSION: There are no changes in the overall aeration from yesterday's most recent film. Minimal 3% or less right apical pneumothorax persists as well as right basilar airspace disease and small right effusion. Aeration on left is likewise unchanged with denser lower lobe consolidation and small effusion on this side as well. Chest tube positioning is unaltered. Electronically Signed   By: Telford Nab M.D.   On: 12/14/2021 03:54   DG Chest Port 1 View  Result Date: 12/13/2021 CLINICAL DATA:  Right pleural effusion.  Chest tube placement. EXAM: PORTABLE CHEST 1 VIEW COMPARISON:  12/13/2021 FINDINGS: A new right pleural pigtail catheter is seen in the lower right hemithorax, with decreased size of small right pleural effusion since prior study. A tiny less than 5% right apical pneumothorax is now seen. Airspace disease in the right lower lung shows no significant change. Small left pleural effusion and left retrocardiac atelectasis or infiltrate, also unchanged. Cardiomegaly stable. A right jugular dual-lumen central venous catheter is seen with tip overlying the mid right atrium. IMPRESSION: Decreased right pleural effusion following pleural drainage catheter placement. Tiny less than 5% right apical pneumothorax. Stable right lower lung airspace disease. Stable small left pleural effusion, and left basilar atelectasis versus infiltrate. Stable cardiomegaly. Electronically Signed   By: Marlaine Hind M.D.   On: 12/13/2021 17:29   DG Chest Port 1 View  Result Date: 12/13/2021 CLINICAL DATA:  Questionable sepsis.  Evaluate for abnormality. EXAM: PORTABLE CHEST 1 VIEW COMPARISON:  06/15/2021 FINDINGS: There is a right jugular dialysis catheter with the  tip extending into the right atrium. Bibasilar chest densities are compatible with pleural effusions and basilar consolidation or airspace disease. Pleural effusions are likely moderate in size particularly on the right side. Negative for pneumothorax. Cardiac silhouette appears to be enlarged and stable from the previous examination. IMPRESSION: New or enlarged bibasilar chest densities. Findings are suggestive for combination of pleural effusions with consolidation/airspace disease. Underlying pneumonia cannot be excluded. Dialysis catheter as described. Electronically Signed   By: Markus Daft M.D.   On: 12/13/2021 11:37   ECHOCARDIOGRAM COMPLETE  Result Date: 12/13/2021    ECHOCARDIOGRAM REPORT   Patient Name:   DEBBE CRUMBLE Date of Exam: 12/13/2021 Medical Rec #:  [id number]        Height:       67.0 in Accession #:    [id number]       Weight:       146.2 lb Date of Birth:  May 26, 1981       BSA:          1.770 m Patient Age:    51 years  BP:           178/136 mmHg Patient Gender: F                HR:           93 bpm. Exam Location:  Inpatient Procedure: 2D Echo, Cardiac Doppler and Color Doppler Indications:    plureral effusion  History:        Patient has no prior history of Echocardiogram examinations.                 Signs/Symptoms:Dyspnea. GERD / PLEURAL EFFUSION.  Sonographer:    Beryle Beams Referring Phys: [id number] [place]  1. Left ventricular ejection fraction, by estimation, is 25 to 30%. The left ventricle has severely decreased function. The left ventricle demonstrates global hypokinesis. The left ventricular internal cavity size was mildly dilated. There is mild concentric left ventricular hypertrophy. Left ventricular diastolic parameters are consistent with Grade II diastolic dysfunction (pseudonormalization).  2. Right ventricular systolic function mildly-to-moderately reduced. The right ventricular size is mildly-to-moderately enlarged. There is mildly elevated  pulmonary artery systolic pressure. The estimated right ventricular systolic pressure is 12.2 mmHg.  3. Left atrial size was severely dilated.  4. Right atrial size was moderately dilated.  5. Moderate pericardial effusion. The pericardial effusion is circumferential. There is no evidence of cardiac tamponade.  6. The mitral valve is abnormal. There is mitral annular dilation with tethering of the posterior leaflet and mild mitral regurgitation.  7. Tricuspid valve regurgitation is moderate.  8. The aortic valve is tricuspid. There is mild thickening of the aortic valve. Aortic valve regurgitation is mild. No aortic stenosis is present.  9. The inferior vena cava is dilated in size with <50% respiratory variability, suggesting right atrial pressure of 15 mmHg. Comparison(s): Compared to prior TTE in 05/2021, there is no significant change. There continues to be biventricular dysfunction and a moderate pericardial effusion present without tamponade. FINDINGS  Left Ventricle: Left ventricular ejection fraction, by estimation, is 25 to 30%. The left ventricle has severely decreased function. The left ventricle demonstrates global hypokinesis. The left ventricular internal cavity size was mildly dilated. There is mild concentric left ventricular hypertrophy. Left ventricular diastolic parameters are consistent with Grade II diastolic dysfunction (pseudonormalization). Right Ventricle: The right ventricular size is mildly-to-moderately enlarged. No increase in right ventricular wall thickness. Right ventricular systolic function mildly-to-moderately reduced. There is mildly elevated pulmonary artery systolic pressure. The tricuspid regurgitant velocity is 2.62 m/s, and with an assumed right atrial pressure of 10 mmHg, the estimated right ventricular systolic pressure is 48.2 mmHg. Left Atrium: Left atrial size was severely dilated. Right Atrium: Right atrial size was moderately dilated. Pericardium: Measuring up to 1.5cm  at end-diastole. A moderately sized pericardial effusion is present. The pericardial effusion is circumferential. There is no evidence of cardiac tamponade. Mitral Valve: The mitral valve is abnormal. There is mild thickening of the mitral valve leaflet(s). There is mild calcification of the mitral valve leaflet(s). There is mild mitral regurgitation. Tricuspid Valve: The tricuspid valve is normal in structure. Tricuspid valve regurgitation is moderate. Aortic Valve: The aortic valve is tricuspid. There is mild thickening of the aortic valve. Aortic valve regurgitation is mild. Aortic regurgitation PHT measures 462 msec. No aortic stenosis is present. Aortic valve mean gradient measures 4.0 mmHg. Aortic  valve peak gradient measures 6.1 mmHg. Aortic valve area, by VTI measures 1.18 cm. Pulmonic Valve: The pulmonic valve was normal in structure. Pulmonic valve regurgitation is mild. Aorta:  The aortic root and ascending aorta are structurally normal, with no evidence of dilitation. Venous: The inferior vena cava is dilated in size with less than 50% respiratory variability, suggesting right atrial pressure of 15 mmHg. IAS/Shunts: The atrial septum is grossly normal.  LEFT VENTRICLE PLAX 2D LVIDd:         2.32 cm      Diastology LVIDs:         3.90 cm      LV e' medial:    6.20 cm/s LV PW:         1.50 cm      LV E/e' medial:  16.6 LV IVS:        3.36 cm      LV e' lateral:   8.81 cm/s LVOT diam:     1.70 cm      LV E/e' lateral: 11.7 LV SV:         20 LV SV Index:   11 LVOT Area:     2.27 cm  LV Volumes (MOD) LV vol d, MOD A2C: 132.0 ml LV vol d, MOD A4C: 117.0 ml LV vol s, MOD A2C: 98.8 ml LV vol s, MOD A4C: 107.0 ml LV SV MOD A2C:     33.2 ml LV SV MOD A4C:     117.0 ml LV SV MOD BP:      30.0 ml RIGHT VENTRICLE             IVC RV S prime:     11.30 cm/s  IVC diam: 2.20 cm TAPSE (M-mode): 2.0 cm LEFT ATRIUM              Index        RIGHT ATRIUM           Index LA diam:        4.30 cm  2.43 cm/m   RA Area:      14.10 cm LA Vol (A2C):   106.0 ml 59.89 ml/m  RA Volume:   33.40 ml  18.87 ml/m LA Vol (A4C):   73.0 ml  41.24 ml/m LA Biplane Vol: 89.6 ml  50.62 ml/m  AORTIC VALVE                    PULMONIC VALVE AV Area (Vmax):    1.40 cm     PV Vmax:       0.69 m/s AV Area (Vmean):   1.19 cm     PV Vmean:      37.100 cm/s AV Area (VTI):     1.18 cm     PV VTI:        0.066 m AV Vmax:           123.00 cm/s  PV Peak grad:  1.9 mmHg AV Vmean:          91.700 cm/s  PV Mean grad:  1.0 mmHg AV VTI:            0.171 m AV Peak Grad:      6.1 mmHg AV Mean Grad:      4.0 mmHg LVOT Vmax:         75.80 cm/s LVOT Vmean:        48.200 cm/s LVOT VTI:          0.089 m LVOT/AV VTI ratio: 0.52 AI PHT:            462 msec  AORTA Ao Root diam: 2.70 cm Ao Asc  diam:  2.30 cm MITRAL VALVE                TRICUSPID VALVE MV Area (PHT): 5.34 cm     TV Peak grad:   68.6 mmHg MV Decel Time: 142 msec     TV Mean grad:   54.0 mmHg MV E velocity: 103.00 cm/s  TV Vmax:        4.14 m/s MV A velocity: 60.70 cm/s   TV Vmean:       355.0 cm/s MV E/A ratio:  1.70         TV VTI:         1.26 msec                             TR Peak grad:   27.5 mmHg                             TR Vmax:        262.00 cm/s                              SHUNTS                             Systemic VTI:  0.09 m                             Systemic Diam: 1.70 cm Gwyndolyn Kaufman MD Electronically signed by Gwyndolyn Kaufman MD Signature Date/Time: 12/13/2021/6:58:25 PM    Final     Microbiology: Results for orders placed or performed during the hospital encounter of 12/13/21  Resp Panel by RT-PCR (Flu A&B, Covid) Nasopharyngeal Swab     Status: None   Collection Time: 12/13/21  9:55 AM   Specimen: Nasopharyngeal Swab; Nasopharyngeal(NP) swabs in vial transport medium  Result Value Ref Range Status   SARS Coronavirus 2 by RT PCR NEGATIVE NEGATIVE Final    Comment: (NOTE) SARS-CoV-2 target nucleic acids are NOT DETECTED.  The SARS-CoV-2 RNA is generally detectable in  upper respiratory specimens during the acute phase of infection. The lowest concentration of SARS-CoV-2 viral copies this assay can detect is 138 copies/mL. A negative result does not preclude SARS-Cov-2 infection and should not be used as the sole basis for treatment or other patient management decisions. A negative result may occur with  improper specimen collection/handling, submission of specimen other than nasopharyngeal swab, presence of viral mutation(s) within the areas targeted by this assay, and inadequate number of viral copies(<138 copies/mL). A negative result must be combined with clinical observations, patient history, and epidemiological information. The expected result is Negative.  Fact Sheet for Patients:  EntrepreneurPulse.com.au  Fact Sheet for Healthcare Providers:  IncredibleEmployment.be  This test is no t yet approved or cleared by the [place] FDA and  has been authorized for detection and/or diagnosis of SARS-CoV-2 by FDA under an Emergency Use Authorization (EUA). This EUA will remain  in effect (meaning this test can be used) for the duration of the COVID-19 declaration under Section 564(b)(1) of the Act, 21 U.S.C.section 360bbb-3(b)(1), unless the authorization is terminated  or revoked sooner.       Influenza A by PCR NEGATIVE NEGATIVE Final   Influenza B by PCR NEGATIVE NEGATIVE Final  Comment: (NOTE) The Xpert Xpress SARS-CoV-2/FLU/RSV plus assay is intended as an aid in the diagnosis of influenza from Nasopharyngeal swab specimens and should not be used as a sole basis for treatment. Nasal washings and aspirates are unacceptable for Xpert Xpress SARS-CoV-2/FLU/RSV testing.  Fact Sheet for Patients: EntrepreneurPulse.com.au  Fact Sheet for Healthcare Providers: IncredibleEmployment.be  This test is not yet approved or cleared by the [place] FDA and has been  authorized for detection and/or diagnosis of SARS-CoV-2 by FDA under an Emergency Use Authorization (EUA). This EUA will remain in effect (meaning this test can be used) for the duration of the COVID-19 declaration under Section 564(b)(1) of the Act, 21 U.S.C. section 360bbb-3(b)(1), unless the authorization is terminated or revoked.  Performed at [facility], [place] [address], [place], [place] [address]   Blood Culture (routine x 2)     Status: None (Preliminary result)   Collection Time: 12/13/21 10:00 AM   Specimen: BLOOD  Result Value Ref Range Status   Specimen Description BLOOD RIGHT ANTECUBITAL  Final   Special Requests   Final    BOTTLES DRAWN AEROBIC AND ANAEROBIC Blood Culture results may not be optimal due to an excessive volume of blood received in culture bottles   Culture   Final    NO GROWTH 4 DAYS Performed at [facility], [place] [address] [place], [place] [address]    Report Status PENDING  Incomplete  Blood Culture (routine x 2)     Status: None (Preliminary result)   Collection Time: 12/13/21 10:05 AM   Specimen: BLOOD  Result Value Ref Range Status   Specimen Description BLOOD LEFT ANTECUBITAL  Final   Special Requests   Final    BOTTLES DRAWN AEROBIC AND ANAEROBIC Blood Culture adequate volume   Culture   Final    NO GROWTH 4 DAYS Performed at [facility], [place] [address] [place], [place] [address]    Report Status PENDING  Incomplete  Pleural fluid culture w Gram Stain     Status: None   Collection Time: 12/13/21  3:14 PM   Specimen: Pleural Fluid  Result Value Ref Range Status   Specimen Description PLEURAL  Final   Special Requests Normal  Final   Gram Stain   Final    ABUNDANT WBC PRESENT, PREDOMINANTLY PMN NO ORGANISMS SEEN    Culture   Final    NO GROWTH 3 DAYS Performed at [facility], [place] [address] [place], [place] [address]    Report Status 12/16/2021 FINAL  Final  MRSA Next Gen by PCR, Nasal     Status:  Abnormal   Collection Time: 12/13/21  4:24 PM   Specimen: Nasal Mucosa; Nasal Swab  Result Value Ref Range Status   MRSA by PCR Next Gen DETECTED (A) NOT DETECTED Final    Comment: RESULT CALLED TO, READ BACK BY AND VERIFIED WITH: BECKY CUMMIUNGS 12/13/2021 @2024  BY JW (NOTE) The GeneXpert MRSA Assay (FDA approved for NASAL specimens only), is one component of a comprehensive MRSA colonization surveillance program. It is not intended to diagnose MRSA infection nor to guide or monitor treatment for MRSA infections. Test performance is not FDA approved in patients less than 76 years old. Performed at [facility], [place] [address], [place], [place] [address]   Expectorated Sputum Assessment w Gram Stain, Rflx to Resp Cult     Status: None (Preliminary result)   Collection Time: 12/14/21 12:38 PM  Specimen: Expectorated Sputum  Result Value Ref Range Status   Specimen Description EXPECTORATED SPUTUM  Final   Special Requests NONE  Final   Sputum evaluation   Final    Sputum specimen not acceptable for testing.  Please recollect.   RESULT CALLED TO, READ BACK BY AND VERIFIED WITH: RN Larene Beach ALLEN 12/16/21@22 :26 BY TW Performed at [facility], [place] [address], [place], [place] [address]    Report Status PENDING  Incomplete    Labs: CBC: Recent Labs  Lab 12/13/21 1005 12/13/21 1136 12/14/21 0258 12/14/21 0913 12/16/21 0132 12/17/21 0110  WBC 12.3*  --  25.2* 30.3* 21.8* 11.5*  NEUTROABS 9.7*  --   --   --  19.3* 9.1*  HGB 7.4* 8.5* 7.8* 8.7* 7.9* 7.7*  HCT 24.7* 25.0* 25.0* 28.5* 25.9* 24.1*  MCV 90.5  --  88.0 88.8 88.7 87.0  PLT 186  --  180 219 275 052   Basic Metabolic Panel: Recent Labs  Lab 12/13/21 1005 12/13/21 1136 12/14/21 0258 12/14/21 0913 12/16/21 0132 12/17/21 0110  NA 132* 131* 133* 134* 129* 130*  K 5.0 4.9 5.9* 4.6 5.5* 4.1  CL 92*  --  89* 95* 92* 92*  CO2 26  --  26 23 20* 22  GLUCOSE 97  --  102* 94 101* 92  BUN 23*  --  41* 21* 64*  37*  CREATININE 6.53*  --  8.18* 5.07* 8.66* 5.85*  CALCIUM 8.8*  --  8.6* 8.9 8.0* 8.1*  PHOS  --   --  7.3* 5.5*  --   --    Liver Function Tests: Recent Labs  Lab 12/13/21 1005 12/13/21 1530 12/14/21 0258 12/14/21 0913 12/16/21 0132  AST 76*  --   --   --  48*  ALT 40  --   --   --  43  ALKPHOS 341*  --   --   --  255*  BILITOT 1.0  --   --   --  0.7  PROT 6.6 6.8  --   --  6.9  ALBUMIN 3.1*  --  2.6* 2.9* 2.6*   CBG: Recent Labs  Lab 12/13/21 2319 12/14/21 0340 12/14/21 0708 12/14/21 1110 12/14/21 1502  GLUCAP 86 89 73 89 118*    Discharge time spent: greater than 30 minutes.  Signed: Estill Cotta, MD [facility] 12/17/2021

## 2021-12-17 NOTE — Progress Notes (Signed)
Cathy [name] to be D/C'd Home per MD order.  Discussed with the patient and all questions fully answered.  VSS, Skin clean, dry and intact without evidence of skin break down, no evidence of skin tears noted. IV catheter discontinued intact. Site without signs and symptoms of complications. Dressing and pressure applied.  An After Visit Summary was printed and given to the patient. Patient received prescription.  D/c education completed with patient/family including follow up instructions, medication list, d/c activities limitations if indicated, with other d/c instructions as indicated by MD - patient able to verbalize understanding, all questions fully answered.   Patient instructed to return to ED, call 911, or call MD for any changes in condition.   Patient escorted via [place], and D/C home via private auto.  [place] 12/17/2021 6:33 PM

## 2021-12-17 NOTE — Progress Notes (Signed)
D/C order noted. Contacted [facility] and spoke to [place]. Clinic aware pt for d/c today and will resume on Thursday.   Melven Sartorius Renal Navigator [phone]

## 2021-12-18 ENCOUNTER — Telehealth (HOSPITAL_COMMUNITY): Payer: Self-pay | Admitting: Nephrology

## 2021-12-18 DIAGNOSIS — N186 End stage renal disease: Secondary | ICD-10-CM | POA: Diagnosis not present

## 2021-12-18 LAB — CULTURE, BLOOD (ROUTINE X 2)
Culture: NO GROWTH
Culture: NO GROWTH
Special Requests: ADEQUATE

## 2021-12-18 NOTE — Telephone Encounter (Signed)
Transition of care contact from inpatient facility  Date of Discharge: 12/17/2021 Date of Contact: 12/18/2021 - attempted Method of contact: Phone  Attempted to contact patient to discuss transition of care from inpatient admission. Patient did not answer the phone. Message was left on the patient's voicemail with call back number [phone].  Veneta Penton, PA-C [employer] Pager [phone]

## 2021-12-19 DIAGNOSIS — Z7689 Persons encountering health services in other specified circumstances: Secondary | ICD-10-CM | POA: Diagnosis not present

## 2021-12-20 DIAGNOSIS — N186 End stage renal disease: Secondary | ICD-10-CM | POA: Diagnosis not present

## 2021-12-21 DIAGNOSIS — Z7689 Persons encountering health services in other specified circumstances: Secondary | ICD-10-CM | POA: Diagnosis not present

## 2021-12-23 DIAGNOSIS — N186 End stage renal disease: Secondary | ICD-10-CM | POA: Diagnosis not present

## 2021-12-24 DIAGNOSIS — Z7689 Persons encountering health services in other specified circumstances: Secondary | ICD-10-CM | POA: Diagnosis not present

## 2021-12-25 DIAGNOSIS — N186 End stage renal disease: Secondary | ICD-10-CM | POA: Diagnosis not present

## 2021-12-26 ENCOUNTER — Emergency Department (HOSPITAL_COMMUNITY): Payer: Medicare Other

## 2021-12-26 ENCOUNTER — Other Ambulatory Visit: Payer: Self-pay

## 2021-12-26 ENCOUNTER — Inpatient Hospital Stay (HOSPITAL_COMMUNITY)
Admission: EM | Admit: 2021-12-26 | Discharge: 2021-12-30 | DRG: 545 | Disposition: A | Discharge status: Home / Self Care | Payer: Medicare Other | Attending: Internal Medicine | Admitting: Internal Medicine

## 2021-12-26 DIAGNOSIS — F3130 Bipolar disorder, current episode depressed, mild or moderate severity, unspecified: Secondary | ICD-10-CM | POA: Diagnosis present

## 2021-12-26 DIAGNOSIS — Z8249 Family history of ischemic heart disease and other diseases of the circulatory system: Secondary | ICD-10-CM | POA: Diagnosis not present

## 2021-12-26 DIAGNOSIS — T8249XD Other complication of vascular dialysis catheter, subsequent encounter: Secondary | ICD-10-CM | POA: Diagnosis not present

## 2021-12-26 DIAGNOSIS — I3139 Other pericardial effusion (noninflammatory): Secondary | ICD-10-CM | POA: Diagnosis present

## 2021-12-26 DIAGNOSIS — M898X9 Other specified disorders of bone, unspecified site: Secondary | ICD-10-CM | POA: Diagnosis not present

## 2021-12-26 DIAGNOSIS — M3212 Pericarditis in systemic lupus erythematosus: Principal | ICD-10-CM | POA: Diagnosis present

## 2021-12-26 DIAGNOSIS — I5042 Chronic combined systolic (congestive) and diastolic (congestive) heart failure: Secondary | ICD-10-CM | POA: Diagnosis present

## 2021-12-26 DIAGNOSIS — I132 Hypertensive heart and chronic kidney disease with heart failure and with stage 5 chronic kidney disease, or end stage renal disease: Secondary | ICD-10-CM | POA: Diagnosis present

## 2021-12-26 DIAGNOSIS — Z87891 Personal history of nicotine dependence: Secondary | ICD-10-CM | POA: Diagnosis not present

## 2021-12-26 DIAGNOSIS — I161 Hypertensive emergency: Secondary | ICD-10-CM | POA: Diagnosis not present

## 2021-12-26 DIAGNOSIS — E876 Hypokalemia: Secondary | ICD-10-CM | POA: Diagnosis not present

## 2021-12-26 DIAGNOSIS — R0781 Pleurodynia: Secondary | ICD-10-CM | POA: Diagnosis present

## 2021-12-26 DIAGNOSIS — J90 Pleural effusion, not elsewhere classified: Secondary | ICD-10-CM | POA: Diagnosis not present

## 2021-12-26 DIAGNOSIS — J189 Pneumonia, unspecified organism: Secondary | ICD-10-CM | POA: Diagnosis present

## 2021-12-26 DIAGNOSIS — Z992 Dependence on renal dialysis: Secondary | ICD-10-CM | POA: Diagnosis not present

## 2021-12-26 DIAGNOSIS — D649 Anemia, unspecified: Secondary | ICD-10-CM | POA: Diagnosis not present

## 2021-12-26 DIAGNOSIS — R188 Other ascites: Secondary | ICD-10-CM | POA: Diagnosis not present

## 2021-12-26 DIAGNOSIS — Z20822 Contact with and (suspected) exposure to covid-19: Secondary | ICD-10-CM | POA: Diagnosis not present

## 2021-12-26 DIAGNOSIS — Z79899 Other long term (current) drug therapy: Secondary | ICD-10-CM | POA: Diagnosis not present

## 2021-12-26 DIAGNOSIS — E871 Hypo-osmolality and hyponatremia: Secondary | ICD-10-CM | POA: Diagnosis present

## 2021-12-26 DIAGNOSIS — E875 Hyperkalemia: Secondary | ICD-10-CM | POA: Diagnosis present

## 2021-12-26 DIAGNOSIS — N189 Chronic kidney disease, unspecified: Secondary | ICD-10-CM | POA: Diagnosis not present

## 2021-12-26 DIAGNOSIS — N2581 Secondary hyperparathyroidism of renal origin: Secondary | ICD-10-CM | POA: Diagnosis present

## 2021-12-26 DIAGNOSIS — D631 Anemia in chronic kidney disease: Secondary | ICD-10-CM

## 2021-12-26 DIAGNOSIS — R52 Pain, unspecified: Secondary | ICD-10-CM | POA: Diagnosis not present

## 2021-12-26 DIAGNOSIS — R778 Other specified abnormalities of plasma proteins: Secondary | ICD-10-CM | POA: Diagnosis not present

## 2021-12-26 DIAGNOSIS — I429 Cardiomyopathy, unspecified: Secondary | ICD-10-CM | POA: Diagnosis not present

## 2021-12-26 DIAGNOSIS — I5043 Acute on chronic combined systolic (congestive) and diastolic (congestive) heart failure: Secondary | ICD-10-CM | POA: Diagnosis present

## 2021-12-26 DIAGNOSIS — R072 Precordial pain: Secondary | ICD-10-CM

## 2021-12-26 DIAGNOSIS — R079 Chest pain, unspecified: Secondary | ICD-10-CM | POA: Diagnosis not present

## 2021-12-26 DIAGNOSIS — R7989 Other specified abnormal findings of blood chemistry: Secondary | ICD-10-CM | POA: Diagnosis present

## 2021-12-26 DIAGNOSIS — I517 Cardiomegaly: Secondary | ICD-10-CM | POA: Diagnosis not present

## 2021-12-26 DIAGNOSIS — R0789 Other chest pain: Secondary | ICD-10-CM | POA: Diagnosis not present

## 2021-12-26 DIAGNOSIS — N186 End stage renal disease: Secondary | ICD-10-CM

## 2021-12-26 DIAGNOSIS — R0602 Shortness of breath: Secondary | ICD-10-CM | POA: Diagnosis not present

## 2021-12-26 DIAGNOSIS — D509 Iron deficiency anemia, unspecified: Secondary | ICD-10-CM | POA: Diagnosis not present

## 2021-12-26 DIAGNOSIS — D689 Coagulation defect, unspecified: Secondary | ICD-10-CM | POA: Diagnosis not present

## 2021-12-26 DIAGNOSIS — M329 Systemic lupus erythematosus, unspecified: Secondary | ICD-10-CM | POA: Diagnosis not present

## 2021-12-26 DIAGNOSIS — Z7689 Persons encountering health services in other specified circumstances: Secondary | ICD-10-CM | POA: Diagnosis not present

## 2021-12-26 DIAGNOSIS — I5022 Chronic systolic (congestive) heart failure: Secondary | ICD-10-CM | POA: Diagnosis present

## 2021-12-26 LAB — BASIC METABOLIC PANEL
Anion gap: 15 (ref 5–15)
BUN: 29 mg/dL — ABNORMAL HIGH (ref 6–20)
CO2: 28 mmol/L (ref 22–32)
Calcium: 8.1 mg/dL — ABNORMAL LOW (ref 8.9–10.3)
Chloride: 91 mmol/L — ABNORMAL LOW (ref 98–111)
Creatinine, Ser: 6.74 mg/dL — ABNORMAL HIGH (ref 0.44–1.00)
GFR, Estimated: 7 mL/min — ABNORMAL LOW (ref >60)
Glucose, Bld: 111 mg/dL — ABNORMAL HIGH (ref 70–99)
Potassium: 4.3 mmol/L (ref 3.5–5.1)
Sodium: 134 mmol/L — ABNORMAL LOW (ref 135–145)

## 2021-12-26 LAB — CBC
HCT: 25 % — ABNORMAL LOW (ref 36.0–46.0)
Hemoglobin: 7.5 g/dL — ABNORMAL LOW (ref 12.0–15.0)
MCH: 26.3 pg (ref 26.0–34.0)
MCHC: 30 g/dL (ref 30.0–36.0)
MCV: 87.7 fL (ref 80.0–100.0)
Platelets: 442 10*3/uL — ABNORMAL HIGH (ref 150–400)
RBC: 2.85 MIL/uL — ABNORMAL LOW (ref 3.87–5.11)
RDW: 15.9 % — ABNORMAL HIGH (ref 11.5–15.5)
WBC: 10.4 10*3/uL (ref 4.0–10.5)
nRBC: 0 % (ref 0.0–0.2)

## 2021-12-26 LAB — I-STAT BETA HCG BLOOD, ED (MC, WL, AP ONLY): I-stat hCG, quantitative: <5 m[IU]/mL (ref <5)

## 2021-12-26 LAB — BRAIN NATRIURETIC PEPTIDE: B Natriuretic Peptide: >4500 pg/mL — ABNORMAL HIGH (ref 0.0–100.0)

## 2021-12-26 LAB — TROPONIN I (HIGH SENSITIVITY)
Troponin I (High Sensitivity): 148 ng/L (ref <18)
Troponin I (High Sensitivity): 149 ng/L (ref <18)

## 2021-12-26 NOTE — ED Provider Triage Note (Signed)
Emergency Medicine Provider Triage Evaluation Note  Cathy [name] , a 41 y.o. female  was evaluated in triage.  Pt complains of chest pain shortness of breath ongoing since being discharged from the hospital 8 days ago.  At that time she was admitted for CHF and large pleural effusion chest tube.  She has bilateral epigastric pain as well as nausea.  She has been compliant with her dialysis and went today.  She has not tried any medications for her symptoms.  Denies fever, chills   Review of Systems  Positive: As per HPI above. Negative: Fever, chills  Physical Exam  BP (!) 180/117 (BP Location: Left Arm)    Pulse 91    Temp 99.7 F (37.6 C) (Oral)    Resp 17    SpO2 99%  Gen:   Awake, cachectic appearing, ill-appearing. Resp:  Normal effort  MSK:   Moves extremities without difficulty  Other:  Chest wall tenderness to palpation.  Medical Decision Making  Medically screening exam initiated at 8:50 PM.  Appropriate orders placed.  Cathy [name] was informed that the remainder of the evaluation will be completed by another provider, this initial triage assessment does not replace that evaluation, and the importance of remaining in the ED until their evaluation is complete.  8:51 PM - Discussed with RN that patient is in need of a room immediately. RN aware and working on room placement.    Cathy [name] A, PA-C 12/26/21 2139

## 2021-12-26 NOTE — ED Notes (Signed)
MD Triffan notified of troponin

## 2021-12-26 NOTE — ED Triage Notes (Signed)
Pt here for cp and shob that has continued since being d/c from hospital last Wednesday. Pt was admitting for CHF w/ large pleural effusion requiring a chest tube. Pt reports bilateral epigastric pain that radiates to back w/ shob and nausea. Pt has been compliant w/ dialysis, went today.

## 2021-12-27 ENCOUNTER — Observation Stay (HOSPITAL_COMMUNITY): Payer: Medicare Other

## 2021-12-27 ENCOUNTER — Encounter (HOSPITAL_COMMUNITY): Payer: Self-pay | Admitting: Internal Medicine

## 2021-12-27 ENCOUNTER — Emergency Department (HOSPITAL_COMMUNITY): Payer: Medicare Other

## 2021-12-27 DIAGNOSIS — R0602 Shortness of breath: Secondary | ICD-10-CM | POA: Diagnosis not present

## 2021-12-27 DIAGNOSIS — Z20822 Contact with and (suspected) exposure to covid-19: Secondary | ICD-10-CM | POA: Diagnosis present

## 2021-12-27 DIAGNOSIS — I161 Hypertensive emergency: Secondary | ICD-10-CM | POA: Diagnosis present

## 2021-12-27 DIAGNOSIS — I132 Hypertensive heart and chronic kidney disease with heart failure and with stage 5 chronic kidney disease, or end stage renal disease: Secondary | ICD-10-CM | POA: Diagnosis present

## 2021-12-27 DIAGNOSIS — R079 Chest pain, unspecified: Secondary | ICD-10-CM | POA: Insufficient documentation

## 2021-12-27 DIAGNOSIS — N186 End stage renal disease: Secondary | ICD-10-CM | POA: Diagnosis not present

## 2021-12-27 DIAGNOSIS — E871 Hypo-osmolality and hyponatremia: Secondary | ICD-10-CM | POA: Diagnosis present

## 2021-12-27 DIAGNOSIS — I5043 Acute on chronic combined systolic (congestive) and diastolic (congestive) heart failure: Secondary | ICD-10-CM | POA: Diagnosis present

## 2021-12-27 DIAGNOSIS — I3139 Other pericardial effusion (noninflammatory): Secondary | ICD-10-CM

## 2021-12-27 DIAGNOSIS — R778 Other specified abnormalities of plasma proteins: Secondary | ICD-10-CM

## 2021-12-27 DIAGNOSIS — I5042 Chronic combined systolic (congestive) and diastolic (congestive) heart failure: Secondary | ICD-10-CM | POA: Diagnosis present

## 2021-12-27 DIAGNOSIS — I120 Hypertensive chronic kidney disease with stage 5 chronic kidney disease or end stage renal disease: Secondary | ICD-10-CM | POA: Diagnosis not present

## 2021-12-27 DIAGNOSIS — Z992 Dependence on renal dialysis: Secondary | ICD-10-CM

## 2021-12-27 DIAGNOSIS — M329 Systemic lupus erythematosus, unspecified: Secondary | ICD-10-CM | POA: Diagnosis not present

## 2021-12-27 DIAGNOSIS — M898X9 Other specified disorders of bone, unspecified site: Secondary | ICD-10-CM | POA: Diagnosis present

## 2021-12-27 DIAGNOSIS — Z87891 Personal history of nicotine dependence: Secondary | ICD-10-CM | POA: Diagnosis not present

## 2021-12-27 DIAGNOSIS — J189 Pneumonia, unspecified organism: Secondary | ICD-10-CM

## 2021-12-27 DIAGNOSIS — F3130 Bipolar disorder, current episode depressed, mild or moderate severity, unspecified: Secondary | ICD-10-CM | POA: Diagnosis present

## 2021-12-27 DIAGNOSIS — Z7689 Persons encountering health services in other specified circumstances: Secondary | ICD-10-CM | POA: Diagnosis not present

## 2021-12-27 DIAGNOSIS — J90 Pleural effusion, not elsewhere classified: Secondary | ICD-10-CM | POA: Diagnosis not present

## 2021-12-27 DIAGNOSIS — N189 Chronic kidney disease, unspecified: Secondary | ICD-10-CM | POA: Diagnosis not present

## 2021-12-27 DIAGNOSIS — N250 Renal osteodystrophy: Secondary | ICD-10-CM | POA: Diagnosis not present

## 2021-12-27 DIAGNOSIS — R0781 Pleurodynia: Secondary | ICD-10-CM | POA: Diagnosis not present

## 2021-12-27 DIAGNOSIS — E875 Hyperkalemia: Secondary | ICD-10-CM | POA: Diagnosis present

## 2021-12-27 DIAGNOSIS — Z79899 Other long term (current) drug therapy: Secondary | ICD-10-CM | POA: Diagnosis not present

## 2021-12-27 DIAGNOSIS — I429 Cardiomyopathy, unspecified: Secondary | ICD-10-CM | POA: Diagnosis present

## 2021-12-27 DIAGNOSIS — R188 Other ascites: Secondary | ICD-10-CM | POA: Diagnosis not present

## 2021-12-27 DIAGNOSIS — I5022 Chronic systolic (congestive) heart failure: Secondary | ICD-10-CM | POA: Diagnosis present

## 2021-12-27 DIAGNOSIS — D631 Anemia in chronic kidney disease: Secondary | ICD-10-CM | POA: Diagnosis present

## 2021-12-27 DIAGNOSIS — N2581 Secondary hyperparathyroidism of renal origin: Secondary | ICD-10-CM | POA: Diagnosis present

## 2021-12-27 DIAGNOSIS — Z8249 Family history of ischemic heart disease and other diseases of the circulatory system: Secondary | ICD-10-CM | POA: Diagnosis not present

## 2021-12-27 DIAGNOSIS — M3212 Pericarditis in systemic lupus erythematosus: Secondary | ICD-10-CM | POA: Diagnosis present

## 2021-12-27 LAB — C-REACTIVE PROTEIN: CRP: 13.4 mg/dL — ABNORMAL HIGH (ref <1.0)

## 2021-12-27 LAB — IRON AND TIBC
Iron: 17 ug/dL — ABNORMAL LOW (ref 28–170)
Saturation Ratios: 7 % — ABNORMAL LOW (ref 10.4–31.8)
TIBC: 237 ug/dL — ABNORMAL LOW (ref 250–450)
UIBC: 220 ug/dL

## 2021-12-27 LAB — HEPATIC FUNCTION PANEL
ALT: 19 U/L (ref 0–44)
AST: 19 U/L (ref 15–41)
Albumin: 2.5 g/dL — ABNORMAL LOW (ref 3.5–5.0)
Alkaline Phosphatase: 318 U/L — ABNORMAL HIGH (ref 38–126)
Bilirubin, Direct: 0.1 mg/dL (ref 0.0–0.2)
Indirect Bilirubin: 0.5 mg/dL (ref 0.3–0.9)
Total Bilirubin: 0.6 mg/dL (ref 0.3–1.2)
Total Protein: 6.9 g/dL (ref 6.5–8.1)

## 2021-12-27 LAB — FOLATE: Folate: 7.9 ng/mL (ref >5.9)

## 2021-12-27 LAB — RESP PANEL BY RT-PCR (FLU A&B, COVID) ARPGX2
Influenza A by PCR: NEGATIVE
Influenza B by PCR: NEGATIVE
SARS Coronavirus 2 by RT PCR: NEGATIVE

## 2021-12-27 LAB — PROCALCITONIN: Procalcitonin: 21.46 ng/mL

## 2021-12-27 LAB — LACTIC ACID, PLASMA: Lactic Acid, Venous: 0.8 mmol/L (ref 0.5–1.9)

## 2021-12-27 LAB — LIPASE, BLOOD: Lipase: 40 U/L (ref 11–51)

## 2021-12-27 LAB — PROTIME-INR
INR: 1.2 (ref 0.8–1.2)
Prothrombin Time: 15.2 seconds (ref 11.4–15.2)

## 2021-12-27 LAB — APTT: aPTT: 32 seconds (ref 24–36)

## 2021-12-27 LAB — SEDIMENTATION RATE: Sed Rate: 130 mm/hr — ABNORMAL HIGH (ref 0–22)

## 2021-12-27 LAB — VITAMIN B12: Vitamin B-12: 265 pg/mL (ref 180–914)

## 2021-12-27 LAB — ECHOCARDIOGRAM LIMITED: S' Lateral: 4 cm

## 2021-12-27 MED ORDER — POLYETHYLENE GLYCOL 3350 17 G PO PACK: 17.0000 g | PACK | Freq: Every day | ORAL | Status: DC | PRN

## 2021-12-27 MED ORDER — DARBEPOETIN ALFA 200 MCG/0.4ML IJ SOSY
200.0000 ug | PREFILLED_SYRINGE | INTRAMUSCULAR | Status: DC
  Filled 2021-12-27: qty 0.4

## 2021-12-27 MED ORDER — CALCIUM ACETATE (PHOS BINDER) 667 MG PO CAPS
1334.0000 mg | ORAL_CAPSULE | Freq: Three times a day (TID) | ORAL | Status: DC
  Administered 2021-12-27 – 2021-12-30 (×9): 1334 mg via ORAL
  Filled 2021-12-27 (×9): qty 2

## 2021-12-27 MED ORDER — RENA-VITE PO TABS
1.0000 | ORAL_TABLET | Freq: Every day | ORAL | Status: DC
  Administered 2021-12-27 – 2021-12-29 (×3): 1 via ORAL
  Filled 2021-12-27 (×3): qty 1

## 2021-12-27 MED ORDER — CLONIDINE HCL 0.2 MG PO TABS
0.3000 mg | ORAL_TABLET | Freq: Three times a day (TID) | ORAL | Status: DC
  Administered 2021-12-27 – 2021-12-30 (×10): 0.3 mg via ORAL
  Filled 2021-12-27 (×10): qty 1

## 2021-12-27 MED ORDER — TORSEMIDE 20 MG PO TABS
20.0000 mg | ORAL_TABLET | Freq: Three times a day (TID) | ORAL | Status: DC
  Administered 2021-12-27 – 2021-12-30 (×10): 20 mg via ORAL
  Filled 2021-12-27 (×10): qty 1

## 2021-12-27 MED ORDER — CARVEDILOL 12.5 MG PO TABS
12.5000 mg | ORAL_TABLET | Freq: Two times a day (BID) | ORAL | Status: DC
  Administered 2021-12-27 – 2021-12-30 (×6): 12.5 mg via ORAL
  Filled 2021-12-27 (×7): qty 1

## 2021-12-27 MED ORDER — PANTOPRAZOLE SODIUM 40 MG PO TBEC
40.0000 mg | DELAYED_RELEASE_TABLET | Freq: Every day | ORAL | Status: DC
  Administered 2021-12-27 – 2021-12-30 (×4): 40 mg via ORAL
  Filled 2021-12-27 (×4): qty 1

## 2021-12-27 MED ORDER — VANCOMYCIN HCL IN DEXTROSE 1-5 GM/200ML-% IV SOLN
1000.0000 mg | Freq: Once | INTRAVENOUS | Status: AC
  Administered 2021-12-27: 1000 mg via INTRAVENOUS
  Filled 2021-12-27: qty 200

## 2021-12-27 MED ORDER — FENTANYL CITRATE PF 50 MCG/ML IJ SOSY
50.0000 ug | PREFILLED_SYRINGE | Freq: Once | INTRAMUSCULAR | Status: AC
  Administered 2021-12-27: 50 ug via INTRAVENOUS
  Filled 2021-12-27: qty 1

## 2021-12-27 MED ORDER — ONDANSETRON HCL 4 MG/2ML IJ SOLN: 4.0000 mg | Freq: Four times a day (QID) | INTRAMUSCULAR | Status: DC | PRN

## 2021-12-27 MED ORDER — ACETAMINOPHEN 325 MG PO TABS
650.0000 mg | ORAL_TABLET | Freq: Four times a day (QID) | ORAL | Status: DC | PRN
  Administered 2021-12-28: 650 mg via ORAL
  Filled 2021-12-27: qty 2

## 2021-12-27 MED ORDER — VANCOMYCIN HCL 500 MG/100ML IV SOLN: 500.0000 mg | INTRAVENOUS | Status: DC

## 2021-12-27 MED ORDER — ALBUTEROL SULFATE (2.5 MG/3ML) 0.083% IN NEBU: 2.5000 mg | INHALATION_SOLUTION | Freq: Four times a day (QID) | RESPIRATORY_TRACT | Status: DC | PRN

## 2021-12-27 MED ORDER — HYDROMORPHONE HCL 1 MG/ML IJ SOLN
1.0000 mg | INTRAMUSCULAR | Status: DC | PRN
  Administered 2021-12-27 – 2021-12-30 (×18): 1 mg via INTRAVENOUS
  Filled 2021-12-27 (×18): qty 1

## 2021-12-27 MED ORDER — ACETAMINOPHEN 650 MG RE SUPP: 650.0000 mg | Freq: Four times a day (QID) | RECTAL | Status: DC | PRN

## 2021-12-27 MED ORDER — AMLODIPINE BESYLATE 10 MG PO TABS
10.0000 mg | ORAL_TABLET | Freq: Every day | ORAL | Status: DC
  Administered 2021-12-27 – 2021-12-30 (×4): 10 mg via ORAL
  Filled 2021-12-27 (×2): qty 1
  Filled 2021-12-27: qty 2
  Filled 2021-12-27: qty 1

## 2021-12-27 MED ORDER — DOXERCALCIFEROL 4 MCG/2ML IV SOLN
5.0000 ug | INTRAVENOUS | Status: DC
  Filled 2021-12-27: qty 4

## 2021-12-27 MED ORDER — HYDRALAZINE HCL 20 MG/ML IJ SOLN: 10.0000 mg | Freq: Four times a day (QID) | INTRAMUSCULAR | Status: DC | PRN

## 2021-12-27 MED ORDER — AMOXICILLIN-POT CLAVULANATE 250-125 MG PO TABS
2.0000 | ORAL_TABLET | Freq: Two times a day (BID) | ORAL | Status: DC
  Administered 2021-12-27 – 2021-12-28 (×4): 2 via ORAL
  Filled 2021-12-27 (×5): qty 2

## 2021-12-27 MED ORDER — ONDANSETRON HCL 4 MG PO TABS: 4.0000 mg | ORAL_TABLET | Freq: Four times a day (QID) | ORAL | Status: DC | PRN

## 2021-12-27 MED ORDER — SEVELAMER CARBONATE 800 MG PO TABS
800.0000 mg | ORAL_TABLET | Freq: Three times a day (TID) | ORAL | Status: DC
  Administered 2021-12-27 (×2): 800 mg via ORAL
  Filled 2021-12-27 (×2): qty 1

## 2021-12-27 MED ORDER — PROSOURCE PLUS PO LIQD
30.0000 mL | Freq: Three times a day (TID) | ORAL | Status: DC
  Administered 2021-12-28: 30 mL via ORAL
  Filled 2021-12-27 (×5): qty 30

## 2021-12-27 MED ORDER — SODIUM CHLORIDE 0.9 % IV SOLN
2.0000 g | Freq: Once | INTRAVENOUS | Status: AC
  Administered 2021-12-27: 2 g via INTRAVENOUS
  Filled 2021-12-27: qty 2

## 2021-12-27 MED ORDER — HEPARIN SODIUM (PORCINE) 5000 UNIT/ML IJ SOLN
5000.0000 [IU] | Freq: Three times a day (TID) | INTRAMUSCULAR | Status: DC
  Administered 2021-12-27 – 2021-12-29 (×7): 5000 [IU] via SUBCUTANEOUS
  Filled 2021-12-27 (×7): qty 1

## 2021-12-27 MED ORDER — METHOCARBAMOL 500 MG PO TABS
500.0000 mg | ORAL_TABLET | Freq: Four times a day (QID) | ORAL | Status: DC | PRN
  Administered 2021-12-27: 500 mg via ORAL
  Filled 2021-12-27: qty 1

## 2021-12-27 MED ORDER — SODIUM CHLORIDE 0.9 % IV SOLN: 1.0000 g | Freq: Every day | INTRAVENOUS | Status: DC

## 2021-12-27 MED ORDER — AMOXICILLIN-POT CLAVULANATE 875-125 MG PO TABS: 1.0000 | ORAL_TABLET | Freq: Two times a day (BID) | ORAL | Status: DC

## 2021-12-27 MED ORDER — HYDROXYZINE HCL 25 MG PO TABS
25.0000 mg | ORAL_TABLET | Freq: Three times a day (TID) | ORAL | Status: DC | PRN
  Administered 2021-12-29 (×2): 25 mg via ORAL
  Filled 2021-12-27 (×2): qty 1

## 2021-12-27 MED ORDER — CHLORHEXIDINE GLUCONATE CLOTH 2 % EX PADS
6.0000 | MEDICATED_PAD | Freq: Every day | CUTANEOUS | Status: DC
  Administered 2021-12-28 – 2021-12-30 (×3): 6 via TOPICAL

## 2021-12-27 NOTE — ED Provider Notes (Signed)
[facility] EMERGENCY DEPARTMENT Provider Note   CSN: [id number] Arrival date & time: 12/26/21  2013     History  Chief Complaint  Patient presents with   Chest Pain   Shortness of Breath    Cathy [name] is a 41 y.o. female.  The history is provided by the patient and medical records.  Chest Pain Associated symptoms: shortness of breath   Shortness of Breath Associated symptoms: chest pain   Cathy [name] is a 41 y.o. female who presents to the Emergency Department complaining of chest pain.  She presents to the ED for evaluation bilateral lower chest and upper back pain.  Pain started just after leaving the hospital last Wednesday.  Pain is burning.  Has associated nausea.  Pain comes and goes.  Also has pain in bilateral legs and feels muscles spasms in her abdomen.  No fever.  Has mild sob - mostly with activity. Has cough, productive of scant sputum.    Has ESRD on HD TThSa, had a full session of dialysis today.    Recently admitted 1/13-17 for HF, lupus, pericardial effusion, respiratory failure and sepsis.  Had right sided chest tube placed 1/13 for pleural effusion, removed on 1/16.   Three days of diarrhea 1-2 times daily    Home Medications Prior to Admission medications   Medication Sig Start Date End Date Taking? Authorizing Provider  cloNIDine (CATAPRES) 0.3 MG tablet Take 0.3 mg by mouth 3 (three) times daily.   Yes [provider]  ipratropium (ATROVENT HFA) 17 MCG/ACT inhaler Inhale 2 puffs into the lungs every 6 (six) hours as needed for wheezing.   Yes [provider]  pantoprazole (PROTONIX) 40 MG tablet Take 40 mg by mouth daily.   Yes [provider]  torsemide (DEMADEX) 20 MG tablet Take 20 mg by mouth in the morning, at noon, and at bedtime.   Yes [provider]  acetaminophen (TYLENOL) 500 MG tablet Take 1,000 mg by mouth every 6 (six) hours as needed for moderate pain.    [provider]  albuterol (VENTOLIN HFA) 108 (90 Base) MCG/ACT inhaler Inhale 2 puffs into the lungs every 6 (six) hours as needed for wheezing or shortness of breath. 12/17/21 12/17/22  Rai, Ripudeep K, MD  amLODipine (NORVASC) 10 MG tablet Take 1 tablet (10 mg total) by mouth daily. 12/17/21   Rai, Vernelle Emerald, MD  carvedilol (COREG) 12.5 MG tablet Take 1 tablet (12.5 mg total) by mouth 2 (two) times daily with a meal. 12/17/21   Rai, Ripudeep K, MD  hydrALAZINE (APRESOLINE) 25 MG tablet Take 1 tablet (25 mg total) by mouth 3 (three) times daily. 12/17/21   Rai, Vernelle Emerald, MD  hydrOXYzine (ATARAX) 25 MG tablet Take 1 tablet (25 mg total) by mouth 3 (three) times daily as needed for itching or anxiety. 12/17/21   Rai, Vernelle Emerald, MD  ipratropium (ATROVENT HFA) 17 MCG/ACT inhaler Inhale 2 puffs into the lungs every 6 (six) hours as needed for wheezing. 12/17/21 12/17/22  Rai, Vernelle Emerald, MD  methocarbamol (ROBAXIN) 500 MG tablet Take 1 tablet (500 mg total) by mouth every 6 (six) hours as needed for muscle spasms. 12/17/21   Rai, Vernelle Emerald, MD  Nutritional Supplements (,FEEDING SUPPLEMENT, PROSOURCE PLUS) liquid Take 30 mLs by mouth 3 (three) times daily between meals. Patient not taking: Reported on 12/14/2021 06/14/21   Edwin Dada, MD  sevelamer carbonate (RENVELA) 800 MG tablet Take 800 mg by  mouth 3 (three) times daily. 08/24/21   [provider]  fenofibrate 160 MG tablet Take 1 tablet (160 mg total) by mouth daily. 12/31/20 01/20/21  Regalado, Cassie Freer, MD      Allergies    Patient has no known allergies.    Review of Systems   Review of Systems  Respiratory:  Positive for shortness of breath.   Cardiovascular:  Positive for chest pain.  All other systems reviewed and are negative.  Physical Exam Updated Vital Signs BP (!) 175/133 (BP Location: Right Arm)    Pulse 84    Temp 99.7 F (37.6 C) (Oral)    Resp 17    SpO2 99%  Physical Exam Vitals and nursing note reviewed.  Constitutional:       Appearance: She is well-developed.  HENT:     Head: Normocephalic and atraumatic.  Cardiovascular:     Rate and Rhythm: Normal rate and regular rhythm.     Heart sounds: Murmur heard.  Pulmonary:     Effort: Pulmonary effort is normal. No respiratory distress.     Comments: Decreased air movement in bilateral bases.   Vas cath in right upper chest wall with dressing in place, CDI Abdominal:     Palpations: Abdomen is soft.     Tenderness: There is no guarding or rebound.     Comments: Moderate generalized abdominal tenderness  Musculoskeletal:        General: No swelling or tenderness.  Skin:    General: Skin is warm and dry.  Neurological:     Mental Status: She is alert and oriented to person, place, and time.  Psychiatric:        Behavior: Behavior normal.    ED Results / Procedures / Treatments   Labs (all labs ordered are listed, but only abnormal results are displayed) Labs Reviewed  BASIC METABOLIC PANEL - Abnormal; Notable for the following components:      Result Value   Sodium 134 (*)    Chloride 91 (*)    Glucose, Bld 111 (*)    BUN 29 (*)    Creatinine, Ser 6.74 (*)    Calcium 8.1 (*)    GFR, Estimated 7 (*)    All other components within normal limits  CBC - Abnormal; Notable for the following components:   RBC 2.85 (*)    Hemoglobin 7.5 (*)    HCT 25.0 (*)    RDW 15.9 (*)    Platelets 442 (*)    All other components within normal limits  BRAIN NATRIURETIC PEPTIDE - Abnormal; Notable for the following components:   B Natriuretic Peptide >4,500.0 (*)    All other components within normal limits  HEPATIC FUNCTION PANEL - Abnormal; Notable for the following components:   Albumin 2.5 (*)    Alkaline Phosphatase 318 (*)    All other components within normal limits  C-REACTIVE PROTEIN - Abnormal; Notable for the following components:   CRP 13.4 (*)    All other components within normal limits  IRON AND TIBC - Abnormal; Notable for the following  components:   Iron 17 (*)    TIBC 237 (*)    Saturation Ratios 7 (*)    All other components within normal limits  SEDIMENTATION RATE - Abnormal; Notable for the following components:   Sed Rate 130 (*)    All other components within normal limits  TROPONIN I (HIGH SENSITIVITY) - Abnormal; Notable for the following components:   Troponin I (High  Sensitivity) 148 (*)    All other components within normal limits  TROPONIN I (HIGH SENSITIVITY) - Abnormal; Notable for the following components:   Troponin I (High Sensitivity) 149 (*)    All other components within normal limits  RESP PANEL BY RT-PCR (FLU A&B, COVID) ARPGX2  CULTURE, BLOOD (ROUTINE X 2)  CULTURE, BLOOD (ROUTINE X 2)  MRSA NEXT GEN BY PCR, NASAL  C DIFFICILE QUICK SCREEN W PCR REFLEX    LACTIC ACID, PLASMA  LIPASE, BLOOD  PROCALCITONIN  PROTIME-INR  APTT  FOLATE  VITAMIN B12  LACTIC ACID, PLASMA  ANTI-DNA ANTIBODY, DOUBLE-STRANDED  C3 COMPLEMENT  C4 COMPLEMENT  I-STAT BETA HCG BLOOD, ED ([facility], [facility], [facility] ONLY)  TROPONIN I (HIGH SENSITIVITY)    EKG None  Radiology DG Chest 2 View  Result Date: 12/26/2021 CLINICAL DATA:  Chest pain. EXAM: CHEST - 2 VIEW COMPARISON:  Chest radiograph dated 12/17/2021. FINDINGS: Right-sided dialysis catheter in similar position. There is cardiomegaly. Small bilateral pleural effusions, left greater right, with bibasilar atelectasis or infiltrate. No pneumothorax. No acute osseous pathology. IMPRESSION: 1. Small bilateral pleural effusions with bibasilar atelectasis or infiltrate. Overall similar to prior radiograph. 2. Cardiomegaly. Electronically Signed   By: Anner Crete M.D.   On: 12/26/2021 21:32   CT CHEST ABDOMEN PELVIS WO CONTRAST  Result Date: 12/27/2021 CLINICAL DATA:  None traumatic chest wall pain. Concern for infection or inflammation. EXAM: CT CHEST, ABDOMEN AND PELVIS WITHOUT CONTRAST TECHNIQUE: Multidetector CT imaging of the chest, abdomen and pelvis was performed  following the standard protocol without IV contrast. RADIATION DOSE REDUCTION: This exam was performed according to the departmental dose-optimization program which includes automated exposure control, adjustment of the mA and/or kV according to patient size and/or use of iterative reconstruction technique. COMPARISON:  CT of the abdomen pelvis dated 12/13/2021. FINDINGS: Evaluation of this exam is limited in the absence of intravenous contrast. Evaluation is also limited due to streak artifact caused by overlying support wires. CT CHEST FINDINGS Cardiovascular: There is cardiomegaly. Dialysis catheter with tip at the cavoatrial junction. There is moderate pericardial effusion measuring 2.3 cm in thickness, increased since the prior CT. Correlation with echocardiogram recommended to exclude cardiac tamponade. The thoracic aorta and central pulmonary arteries are grossly unremarkable but poorly visualized. Mediastinum/Nodes: No hilar or mediastinal adenopathy. The esophagus and the thyroid gland are grossly unremarkable. No mediastinal fluid collection. Lungs/Pleura: Small left pleural effusion. There is diffuse interstitial and interlobular septal prominence of the left lung concerning for asymmetric edema. Patchy area of consolidation with air bronchogram in the right middle lobe consistent with atelectasis or pneumonia. No pneumothorax. The central airways are patent. Musculoskeletal: No chest wall mass or suspicious bone lesions identified. CT ABDOMEN PELVIS FINDINGS No intra-abdominal free air.  Small ascites. Hepatobiliary: The liver is unremarkable. No intrahepatic biliary dilatation. No calcified gallstone. Pancreas: Unremarkable. No pancreatic ductal dilatation or surrounding inflammatory changes. Spleen: Normal in size without focal abnormality. Adrenals/Urinary Tract: The adrenal glands unremarkable. There is no hydronephrosis or nephrolithiasis on either side. The visualized ureters and urinary bladder  appear unremarkable. Stomach/Bowel: There is no bowel obstruction. The appendix is unremarkable as visualized. Vascular/Lymphatic: The abdominal aorta and IVC are grossly unremarkable. No portal venous gas. Reproductive: The uterus is anteverted. Bilateral tubal ligation clips. Other: None Musculoskeletal: No acute or significant osseous findings. IMPRESSION: 1. Cardiomegaly with moderate pericardial effusion, increased since the prior CT. Correlation with echocardiogram recommended to exclude cardiac tamponade. 2. Small left pleural effusion. 3. Patchy area of  consolidation with air bronchogram in the right middle lobe consistent with atelectasis or pneumonia. 4. Small ascites. 5. No hydronephrosis or nephrolithiasis. 6. No bowel obstruction. Electronically Signed   By: Anner Crete M.D.   On: 12/27/2021 02:36    Procedures Procedures    Medications Ordered in ED Medications  heparin injection 5,000 Units (has no administration in time range)  acetaminophen (TYLENOL) tablet 650 mg (has no administration in time range)    Or  acetaminophen (TYLENOL) suppository 650 mg (has no administration in time range)  polyethylene glycol (MIRALAX / GLYCOLAX) packet 17 g (has no administration in time range)  ondansetron (ZOFRAN) tablet 4 mg (has no administration in time range)    Or  ondansetron (ZOFRAN) injection 4 mg (has no administration in time range)  vancomycin (VANCOCIN) IVPB 1000 mg/200 mL premix (has no administration in time range)  vancomycin (VANCOREADY) IVPB 500 mg/100 mL (has no administration in time range)  ceFEPIme (MAXIPIME) 2 g in sodium chloride 0.9 % 100 mL IVPB (has no administration in time range)  ceFEPIme (MAXIPIME) 1 g in sodium chloride 0.9 % 100 mL IVPB (has no administration in time range)  hydrALAZINE (APRESOLINE) injection 10 mg (has no administration in time range)  albuterol (VENTOLIN HFA) 108 (90 Base) MCG/ACT inhaler 2 puff (has no administration in time range)   amLODipine (NORVASC) tablet 10 mg (has no administration in time range)  cloNIDine (CATAPRES) tablet 0.3 mg (has no administration in time range)  carvedilol (COREG) tablet 12.5 mg (has no administration in time range)  hydrOXYzine (ATARAX) tablet 25 mg (has no administration in time range)  methocarbamol (ROBAXIN) tablet 500 mg (has no administration in time range)  (feeding supplement) PROSource Plus liquid 30 mL (has no administration in time range)  pantoprazole (PROTONIX) EC tablet 40 mg (has no administration in time range)  sevelamer carbonate (RENVELA) tablet 800 mg (has no administration in time range)  torsemide (DEMADEX) tablet 20 mg (has no administration in time range)  fentaNYL (SUBLIMAZE) injection 50 mcg (50 mcg Intravenous Given 12/27/21 0039)    ED Course/ Medical Decision Making/ A&P                           Medical Decision Making Amount and/or Complexity of Data Reviewed Labs: ordered. Radiology: ordered.  Risk Prescription drug management. Decision regarding hospitalization.   Patient with history of ESRD on hemodialysis, lupus not on current treatment here for evaluation of chest pain, abdominal pain and leg pain similar to what brought her into the hospital previously.  Patient is uncomfortable appearing, chronically ill-appearing on evaluation.  Records reviewed and epic.  Her troponins are elevated but similar when compared to priors.  EKG is without acute ischemic changes.  Renal function is appropriate given her end-stage renal disease.  CBC significant for progressive anemia, question if this is contributing to her symptoms.  CT chest abdomen pelvis was obtained given her symptoms.  CT scan demonstrates progressive pericardial effusion as well as atelectasis versus pneumonia.  Will defer antibiotics at this time as she recently completed treatment for community-acquired pneumonia.  Given patient's severe pain feel she would be better served by admission for  ongoing observation and treatment.  Medicine consulted for admission for ongoing treatment.        Final Clinical Impression(s) / ED Diagnoses Final diagnoses:  None    Rx / DC Orders ED Discharge Orders     None  Quintella Reichert, MD 12/27/21 [phone]

## 2021-12-27 NOTE — Consult Note (Signed)
[facility] Renal Consultation Note    Indication for Consultation:  Management of ESRD/hemodialysis; anemia, hypertension/volume and secondary hyperparathyroidism PCP:  HPI: Cathy [name] is a 41 y.o. female with ESRD on hemodialysis T,Th,S at [facility]. PMH: Lupus, Bipolar disorder, H/O of chronic pericardial effusion, asthma, anemia of ESRD, SHPT. Recent admission: 01/13-01/17/2023 for acute on chronic respiratory failure with hypoxia. Last HD 12/23/2021. Had high gain 9.5 kg on arrival, left 4.8 kg above OP EDW.   Patient returns to ED today with continued complaints of chest discomfort. Low grade temp on arrival to ED 99.7. Very hypertensive BP 180/117 HR 91. O2 sats 99% on RA. Chest xray with Small bilateral pleural effusions with bibasilar atelectasis or infiltrate. Overall similar to prior radiograph, cardiomegaly. Chemistry unremarkable for ESRD pt. Troponin 148-149 flat trend in ESRD pt. Lactate 0.8. WBC 10.4 HGB 7.5. No recent ESA at OP clinic but started Fe load yesterday. She has been admitted for PNA. Concerned for volume overload, demand ischemia as she left well above OP EDW yesterday however no evidence of volume overload by exam. HD tomorrow on schedule.   Past Medical History:  Diagnosis Date   Anasarca associated with disorder of kidney 06/08/2021   Anxiety    Asthma    Bipolar depression ([place])    Depression    Dyspnea    ESRD (end stage renal disease) on dialysis ([place]) 10/2020   GERD (gastroesophageal reflux disease)    Gonorrhea    Lupus (HCC)    Pelvic inflammatory disease (PID)    Pleural effusion 06/08/2021   Renal hypertension    Trichomonas infection    Past Surgical History:  Procedure Laterality Date   IR FLUORO GUIDE CV LINE RIGHT  11/30/2020   IR FLUORO GUIDE CV LINE RIGHT  06/13/2021   IR REMOVAL TUN CV CATH W/O FL  06/11/2021   IR US GUIDE VASC ACCESS RIGHT  11/30/2020   IR US GUIDE VASC ACCESS RIGHT  06/13/2021    RENAL BIOPSY     TEE WITHOUT CARDIOVERSION N/A 06/13/2021   Procedure: TRANSESOPHAGEAL ECHOCARDIOGRAM (TEE);  Surgeon: Josue Hector, MD;  Location: [place];  Service: Cardiovascular;  Laterality: N/A;   TUBAL LIGATION N/A 02/03/2019   Procedure: POST PARTUM TUBAL LIGATION;  Surgeon: Aletha Halim, MD;  Location: [facility] LD [facility]S;  Service: Gynecology;  Laterality: N/A;   Family History  Problem Relation Age of Onset   Diabetes Maternal Grandmother    Hypertension Maternal Grandmother    Diabetes Maternal Grandfather    Hypertension Maternal Grandfather    Diabetes Paternal Grandmother    Hypertension Paternal Grandmother    Diabetes Paternal Grandfather    Hypertension Paternal Grandfather    Social History:  reports that she quit smoking about 14 months ago. Her smoking use included cigarettes. She smoked an average of .25 packs per day. She has never used smokeless tobacco. She reports that she does not currently use alcohol. She reports that she does not currently use drugs after having used the following drugs: Marijuana. No Known Allergies Prior to Admission medications   Medication Sig Start Date End Date Taking? Authorizing Provider  cloNIDine (CATAPRES) 0.3 MG tablet Take 0.3 mg by mouth 3 (three) times daily.   Yes [provider]  ipratropium (ATROVENT HFA) 17 MCG/ACT inhaler Inhale 2 puffs into the lungs every 6 (six) hours as needed for wheezing.   Yes [provider]  pantoprazole (PROTONIX) 40 MG tablet Take 40 mg by  mouth daily.   Yes [provider]  torsemide (DEMADEX) 20 MG tablet Take 20 mg by mouth in the morning, at noon, and at bedtime.   Yes [provider]  acetaminophen (TYLENOL) 500 MG tablet Take 1,000 mg by mouth every 6 (six) hours as needed for moderate pain.    [provider]  albuterol (VENTOLIN HFA) 108 (90 Base) MCG/ACT inhaler Inhale 2 puffs into the lungs every 6 (six) hours as needed for wheezing or  shortness of breath. 12/17/21 12/17/22  Rai, Ripudeep K, MD  amLODipine (NORVASC) 10 MG tablet Take 1 tablet (10 mg total) by mouth daily. 12/17/21   Rai, Vernelle Emerald, MD  carvedilol (COREG) 12.5 MG tablet Take 1 tablet (12.5 mg total) by mouth 2 (two) times daily with a meal. 12/17/21   Rai, Ripudeep K, MD  hydrALAZINE (APRESOLINE) 25 MG tablet Take 1 tablet (25 mg total) by mouth 3 (three) times daily. 12/17/21   Rai, Vernelle Emerald, MD  hydrOXYzine (ATARAX) 25 MG tablet Take 1 tablet (25 mg total) by mouth 3 (three) times daily as needed for itching or anxiety. 12/17/21   Rai, Vernelle Emerald, MD  ipratropium (ATROVENT HFA) 17 MCG/ACT inhaler Inhale 2 puffs into the lungs every 6 (six) hours as needed for wheezing. 12/17/21 12/17/22  Rai, Vernelle Emerald, MD  methocarbamol (ROBAXIN) 500 MG tablet Take 1 tablet (500 mg total) by mouth every 6 (six) hours as needed for muscle spasms. 12/17/21   Rai, Vernelle Emerald, MD  Nutritional Supplements (,FEEDING SUPPLEMENT, PROSOURCE PLUS) liquid Take 30 mLs by mouth 3 (three) times daily between meals. Patient not taking: Reported on 12/14/2021 06/14/21   Edwin Dada, MD  sevelamer carbonate (RENVELA) 800 MG tablet Take 800 mg by mouth 3 (three) times daily. 08/24/21   [provider]  fenofibrate 160 MG tablet Take 1 tablet (160 mg total) by mouth daily. 12/31/20 01/20/21  Regalado, Cassie Freer, MD   Current Facility-Administered Medications  Medication Dose Route Frequency Provider Last Rate Last Admin   (feeding supplement) PROSource Plus liquid 30 mL  30 mL Oral TID BM Shalhoub, Sherryll Burger, MD       acetaminophen (TYLENOL) tablet 650 mg  650 mg Oral Q6H PRN Shalhoub, Sherryll Burger, MD       Or   acetaminophen (TYLENOL) suppository 650 mg  650 mg Rectal Q6H PRN Shalhoub, Sherryll Burger, MD       albuterol (PROVENTIL) (2.5 MG/3ML) 0.083% nebulizer solution 2.5 mg  2.5 mg Inhalation Q6H PRN Shalhoub, Sherryll Burger, MD       amLODipine (NORVASC) tablet 10 mg  10 mg Oral Daily Shalhoub,  Sherryll Burger, MD   10 mg at 12/27/21 0738   amoxicillin-clavulanate (AUGMENTIN) 250-125 MG per tablet 2 tablet  2 tablet Oral Q12H Arrien, Jimmy Picket, MD   2 tablet at 12/27/21 1230   carvedilol (COREG) tablet 12.5 mg  12.5 mg Oral BID WC Vernelle Emerald, MD   12.5 mg at 12/27/21 0811   cloNIDine (CATAPRES) tablet 0.3 mg  0.3 mg Oral Q8H Shalhoub, Sherryll Burger, MD   0.3 mg at 12/27/21 1426   heparin injection 5,000 Units  5,000 Units Subcutaneous Q8H Shalhoub, Sherryll Burger, MD   5,000 Units at 12/27/21 1425   hydrALAZINE (APRESOLINE) injection 10 mg  10 mg Intravenous Q6H PRN Shalhoub, Sherryll Burger, MD       HYDROmorphone (DILAUDID) injection 1 mg  1 mg Intravenous Q2H PRN Arrien, Jimmy Picket, MD  1 mg at 12/27/21 1406   hydrOXYzine (ATARAX) tablet 25 mg  25 mg Oral TID PRN Vernelle Emerald, MD       methocarbamol (ROBAXIN) tablet 500 mg  500 mg Oral Q6H PRN Shalhoub, Sherryll Burger, MD       ondansetron [facility]) tablet 4 mg  4 mg Oral Q6H PRN Shalhoub, Sherryll Burger, MD       Or   ondansetron [facility]) injection 4 mg  4 mg Intravenous Q6H PRN Shalhoub, Sherryll Burger, MD       pantoprazole (PROTONIX) EC tablet 40 mg  40 mg Oral Daily Shalhoub, Sherryll Burger, MD   40 mg at 12/27/21 1053   polyethylene glycol (MIRALAX / GLYCOLAX) packet 17 g  17 g Oral Daily PRN Shalhoub, Sherryll Burger, MD       sevelamer carbonate (RENVELA) tablet 800 mg  800 mg Oral TID WC Shalhoub, Sherryll Burger, MD   800 mg at 12/27/21 1230   torsemide (DEMADEX) tablet 20 mg  20 mg Oral TID Vernelle Emerald, MD   20 mg at 12/27/21 1231   Current Outpatient Medications  Medication Sig Dispense Refill   cloNIDine (CATAPRES) 0.3 MG tablet Take 0.3 mg by mouth 3 (three) times daily.     ipratropium (ATROVENT HFA) 17 MCG/ACT inhaler Inhale 2 puffs into the lungs every 6 (six) hours as needed for wheezing.     pantoprazole (PROTONIX) 40 MG tablet Take 40 mg by mouth daily.     torsemide (DEMADEX) 20 MG tablet Take 20 mg by mouth in the morning, at noon, and at  bedtime.     acetaminophen (TYLENOL) 500 MG tablet Take 1,000 mg by mouth every 6 (six) hours as needed for moderate pain.     albuterol (VENTOLIN HFA) 108 (90 Base) MCG/ACT inhaler Inhale 2 puffs into the lungs every 6 (six) hours as needed for wheezing or shortness of breath. 8 g 1   amLODipine (NORVASC) 10 MG tablet Take 1 tablet (10 mg total) by mouth daily. 30 tablet 3   carvedilol (COREG) 12.5 MG tablet Take 1 tablet (12.5 mg total) by mouth 2 (two) times daily with a meal. 60 tablet 3   hydrALAZINE (APRESOLINE) 25 MG tablet Take 1 tablet (25 mg total) by mouth 3 (three) times daily. 90 tablet 3   hydrOXYzine (ATARAX) 25 MG tablet Take 1 tablet (25 mg total) by mouth 3 (three) times daily as needed for itching or anxiety. 30 tablet 0   ipratropium (ATROVENT HFA) 17 MCG/ACT inhaler Inhale 2 puffs into the lungs every 6 (six) hours as needed for wheezing. 1 each 12   methocarbamol (ROBAXIN) 500 MG tablet Take 1 tablet (500 mg total) by mouth every 6 (six) hours as needed for muscle spasms. 30 tablet 0   Nutritional Supplements (,FEEDING SUPPLEMENT, PROSOURCE PLUS) liquid Take 30 mLs by mouth 3 (three) times daily between meals. (Patient not taking: Reported on 12/14/2021) 887 mL 2   sevelamer carbonate (RENVELA) 800 MG tablet Take 800 mg by mouth 3 (three) times daily.     Labs: Basic Metabolic Panel: Recent Labs  Lab 12/26/21 2049  NA 134*  K 4.3  CL 91*  CO2 28  GLUCOSE 111*  BUN 29*  CREATININE 6.74*  CALCIUM 8.1*   Liver Function Tests: Recent Labs  Lab 12/27/21 0116  AST 19  ALT 19  ALKPHOS 318*  BILITOT 0.6  PROT 6.9  ALBUMIN 2.5*   Recent Labs  Lab 12/27/21 0116  LIPASE 40   No results for input(s): AMMONIA in the last 168 hours. CBC: Recent Labs  Lab 12/26/21 2049  WBC 10.4  HGB 7.5*  HCT 25.0*  MCV 87.7  PLT 442*   Cardiac Enzymes: No results for input(s): CKTOTAL, CKMB, CKMBINDEX, TROPONINI in the last 168 hours. CBG: No results for input(s):  GLUCAP in the last 168 hours. Iron Studies:  Recent Labs    12/27/21 0340  IRON 17*  TIBC 237*   Studies/Results: DG Chest 2 View  Result Date: 12/26/2021 CLINICAL DATA:  Chest pain. EXAM: CHEST - 2 VIEW COMPARISON:  Chest radiograph dated 12/17/2021. FINDINGS: Right-sided dialysis catheter in similar position. There is cardiomegaly. Small bilateral pleural effusions, left greater right, with bibasilar atelectasis or infiltrate. No pneumothorax. No acute osseous pathology. IMPRESSION: 1. Small bilateral pleural effusions with bibasilar atelectasis or infiltrate. Overall similar to prior radiograph. 2. Cardiomegaly. Electronically Signed   By: Anner Crete M.D.   On: 12/26/2021 21:32   ECHOCARDIOGRAM LIMITED  Result Date: 12/27/2021    ECHOCARDIOGRAM LIMITED REPORT   Patient Name:   Cathy [name] Date of Exam: 12/27/2021 Medical Rec #:  [id number]        Height:       67.0 in Accession #:    [id number]       Weight:       102.3 lb Date of Birth:  10-23-1981       BSA:          1.521 m Patient Age:    87 years         BP:           175/118 mmHg Patient Gender: F                HR:           73 bpm. Exam Location:  Inpatient Procedure: Limited Echo, Limited Color Doppler and Cardiac Doppler Indications:    pericardial effusion  History:        Patient has prior history of Echocardiogram examinations, most                 recent 12/13/2021. End stage renal disease. Lupus.; Risk                 Factors:Hypertension.  Sonographer:    Johny Chess RDCS Referring Phys: [id number] [place]  1. Left ventricular ejection fraction, by estimation, is 25 to 30%. The left ventricle has severely decreased function. The left ventricle demonstrates global hypokinesis.  2. Right ventricular systolic function is moderately reduced. The right ventricular size is normal. There is normal pulmonary artery systolic pressure. The estimated right ventricular systolic pressure is 38.1 mmHg.  3.  Moderate pericardial effusion. The pericardial effusion is circumferential.  4. The mitral valve is normal in structure. Mild mitral valve regurgitation. No evidence of mitral stenosis.  5. The aortic valve is normal in structure. Aortic valve regurgitation is trivial. No aortic stenosis is present.  6. The inferior vena cava is normal in size with greater than 50% respiratory variability, suggesting right atrial pressure of 3 mmHg. FINDINGS  Left Ventricle: Left ventricular ejection fraction, by estimation, is 25 to 30%. The left ventricle has severely decreased function. The left ventricle demonstrates global hypokinesis. The left ventricular internal cavity size was normal in size. There is no left ventricular hypertrophy. Right Ventricle: The right ventricular size is normal. No increase in right ventricular wall thickness. Right ventricular systolic function  is moderately reduced. There is normal pulmonary artery systolic pressure. The tricuspid regurgitant velocity is 2.74 m/s, and with an assumed right atrial pressure of 3 mmHg, the estimated right ventricular systolic pressure is 82.9 mmHg. Left Atrium: Left atrial size was normal in size. Right Atrium: Right atrial size was normal in size. Pericardium: A moderately sized pericardial effusion is present. The pericardial effusion is circumferential. Mitral Valve: The mitral valve is normal in structure. Mild mitral valve regurgitation. No evidence of mitral valve stenosis. Tricuspid Valve: The tricuspid valve is normal in structure. Tricuspid valve regurgitation is mild . No evidence of tricuspid stenosis. Aortic Valve: The aortic valve is normal in structure. Aortic valve regurgitation is trivial. No aortic stenosis is present. Pulmonic Valve: The pulmonic valve was normal in structure. Pulmonic valve regurgitation is trivial. No evidence of pulmonic stenosis. Aorta: The aortic root is normal in size and structure. Venous: The inferior vena cava is normal in  size with greater than 50% respiratory variability, suggesting right atrial pressure of 3 mmHg. IAS/Shunts: No atrial level shunt detected by color flow Doppler. LEFT VENTRICLE PLAX 2D LVIDd:         4.90 cm Diastology LVIDs:         4.00 cm LV e' medial:  6.31 cm/s LV PW:         1.50 cm LV e' lateral: 5.55 cm/s LV IVS:        1.10 cm  IVC IVC diam: 1.50 cm  AORTA Ao Asc diam: 3.20 cm TRICUSPID VALVE TR Peak grad:   30.0 mmHg TR Vmax:        274.00 cm/s Fransico Him MD Electronically signed by Fransico Him MD Signature Date/Time: 12/27/2021/10:32:35 AM    Final    CT CHEST ABDOMEN PELVIS WO CONTRAST  Result Date: 12/27/2021 CLINICAL DATA:  None traumatic chest wall pain. Concern for infection or inflammation. EXAM: CT CHEST, ABDOMEN AND PELVIS WITHOUT CONTRAST TECHNIQUE: Multidetector CT imaging of the chest, abdomen and pelvis was performed following the standard protocol without IV contrast. RADIATION DOSE REDUCTION: This exam was performed according to the departmental dose-optimization program which includes automated exposure control, adjustment of the mA and/or kV according to patient size and/or use of iterative reconstruction technique. COMPARISON:  CT of the abdomen pelvis dated 12/13/2021. FINDINGS: Evaluation of this exam is limited in the absence of intravenous contrast. Evaluation is also limited due to streak artifact caused by overlying support wires. CT CHEST FINDINGS Cardiovascular: There is cardiomegaly. Dialysis catheter with tip at the cavoatrial junction. There is moderate pericardial effusion measuring 2.3 cm in thickness, increased since the prior CT. Correlation with echocardiogram recommended to exclude cardiac tamponade. The thoracic aorta and central pulmonary arteries are grossly unremarkable but poorly visualized. Mediastinum/Nodes: No hilar or mediastinal adenopathy. The esophagus and the thyroid gland are grossly unremarkable. No mediastinal fluid collection. Lungs/Pleura: Small  left pleural effusion. There is diffuse interstitial and interlobular septal prominence of the left lung concerning for asymmetric edema. Patchy area of consolidation with air bronchogram in the right middle lobe consistent with atelectasis or pneumonia. No pneumothorax. The central airways are patent. Musculoskeletal: No chest wall mass or suspicious bone lesions identified. CT ABDOMEN PELVIS FINDINGS No intra-abdominal free air.  Small ascites. Hepatobiliary: The liver is unremarkable. No intrahepatic biliary dilatation. No calcified gallstone. Pancreas: Unremarkable. No pancreatic ductal dilatation or surrounding inflammatory changes. Spleen: Normal in size without focal abnormality. Adrenals/Urinary Tract: The adrenal glands unremarkable. There is no hydronephrosis or nephrolithiasis on either  side. The visualized ureters and urinary bladder appear unremarkable. Stomach/Bowel: There is no bowel obstruction. The appendix is unremarkable as visualized. Vascular/Lymphatic: The abdominal aorta and IVC are grossly unremarkable. No portal venous gas. Reproductive: The uterus is anteverted. Bilateral tubal ligation clips. Other: None Musculoskeletal: No acute or significant osseous findings. IMPRESSION: 1. Cardiomegaly with moderate pericardial effusion, increased since the prior CT. Correlation with echocardiogram recommended to exclude cardiac tamponade. 2. Small left pleural effusion. 3. Patchy area of consolidation with air bronchogram in the right middle lobe consistent with atelectasis or pneumonia. 4. Small ascites. 5. No hydronephrosis or nephrolithiasis. 6. No bowel obstruction. Electronically Signed   By: Anner Crete M.D.   On: 12/27/2021 02:36    ROS: As per HPI otherwise negative.   Physical Exam: Vitals:   12/27/21 1200 12/27/21 1345 12/27/21 1400 12/27/21 1415  BP: 117/77 120/87 123/83 120/83  Pulse:  68  70  Resp: 20 (!) 21 (!) 22 18  Temp:      TempSrc:      SpO2: 99% 98%  98%      General: Thin, chronically ill appearing female no acute distress. Head: Normocephalic, atraumatic, sclera non-icteric, mucus membranes are moist Neck: Supple. JVD not elevated. Lungs: Clear bilaterally to auscultation without wheezes, rales, or rhonchi. Breathing is unlabored. Heart: RRR with S1 S2. No murmurs, rubs, or gallops appreciated. Abdomen: Soft, non-tender, non-distended with normoactive bowel sounds. No rebound/guarding. No obvious abdominal masses. M-S:  Strength and tone appear normal for age. Lower extremities:without edema or ischemic changes, no open wounds  Neuro: Alert and oriented X 3. Moves all extremities spontaneously. Psych:  Responds to questions appropriately with a normal affect. Dialysis Access: RIJ TDC drsg CDI  Dialysis Orders: Center: [facility] 4h   2/2 bath 400/800 56kg  Hep none   R TDC  - hect 5 ug  IV tiw  - mircera 200 mcg IV q 2 weeks (last 11/21/2022)  - venofer 100 mg IV X 5 doses (1/5 given 12/26/2021)  Assessment/Plan:  RML PNA: BC pending, ABX per primary  Possible volume overload: Elevated troponin, flat trend. Left 4.8 kg above OP EDW. UF as tolerated tomorrow.   ESRD -  T,Th,S HD 12/28/2021 on schedule. 2.0 K bath.   Hypertension/volume  - BP elevated in ED, now appears controlled. Continue home meds. No evidence of volume overload by exam, small bilateral pleural effusions on Xray.   Anemia  - HGB 7.5 today. No recent OP ESA. Will order for tomorrow. Had started OP Fe load, Tsat 7 here. Hold Fe load until infectious process ruled out.   Metabolic bone disease -  continue binders, VDRA.   Nutrition - Low albumin. Start protein supps. RENAL DIET with fluid restrictions. Lupus-per primary H/O chronic pericardial effusion without evidence of tamponade. Per primary.   Aziah Brostrom H. Owens Shark, NP-C 12/27/2021, 2:31 PM  [employer] [phone]

## 2021-12-27 NOTE — Progress Notes (Signed)
Progress Note   Patient: Cathy [name] RJJ:[id number] DOB: 06-12-1981 DOA: 12/26/2021     0 DOS: the patient was seen and examined on 12/27/2021   Brief hospital course: [name] was admitted to the hospital with the working diagnosis of right middle lobe pneumonia.   41 yo female with the past medical history of systemic lupus erythematosus, ESRD, diastolic heart failure, HTN, chronic respiratory failure, chronic anemia and bipolar disorder who presented with dyspnea and chest pain.  Recent hospitalization 01/13 to 01/17 2023 for right lowe lobe pneumonia, that required non invasive mechanical ventilation and chest tube placement. Reported persistent chest pain since her discharge from the hospital, associated with dyspnea and productive cough. On her initial physical examination her blood pressure was 151/106, HR 85, RR 38 and oxygen saturation 99%,   Na 134. K 4.3, Cl 91, bicarb 28, glucose 111, bun 29 and cr 6,74 BNP >4,500 High sensitive troponin 148 and 149  Wbc 10.4, hgb 7,4, hct 25 and plt 442  SARS COVID 19 negative   Chest radiograph with cardiomegaly, small bilateral pleural effusions and bibasilar atelectasis, HD cathter in place right IJ,   EKG 89 bpm, normal axis, normal intervals, sinus rhythm, with poor R wave progression, no significant ST segment or T wave changes.   Assessment and Plan * Pneumonia of right middle lobe due to infectious organism- (present on admission) Patient with pleuritic chest pain this am, required IV hydromorphone for pain control.  CT with right middle lobe opacity with air bronchogram, small effusion, but improved compared to prior CT chest.  Noted worsening pericardial effusion.   Old records reviewed recent hospitalization she was treated for right lower lobe pneumonia, para pneumonic effusion requiring chest tube.  Apparently received antibiotic therapy from 01/13 to 01/17 with cefepime.  Plan to resume antibiotic therapy with  Augmentin, patient will nee a longer course of antibiotic therapy for complicated community acquired pneumonia.  Continue oxymetry monitoring and supplemental 02 per [place] Air[employer] clearing techniques with incentive spirometer, out of bed to chair tid with meals   ESRD on hemodialysis [facility]) Patient is on T-TH and Sat schedule for HD. Today patient with no signs of hypervolemia and her K is not elevated. Plan to consult nephrology for routine renal replacement therapy.   Pericardial effusion- (present on admission) Worsening pericardial effusion per CT imaging.  Patient is hemodynamically stable this am Continue blood pressure monitoring and follow up on echocardiogram.   Lupus (systemic lupus erythematosus) ([place])- (present on admission) No signs of acute flare, patient will need outpatient follow up.   Hypertensive emergency- (present on admission) On renal replacement therapy.  Troponin elevation due to uncontrolled HTN no clinical signs of acute coronary syndrome. Continue blood pressure control with amlodipine, clonidine and carvedilol.    Pleuritic chest pain- (present on admission) Suspected pleuritic chest pain from right middle lobe infiltrate.    Chronic combined systolic and diastolic congestive heart failure (HCC)- (present on admission) No clinical signs of heart failure exacerbation, echocardiogram with decreased LV EF 25% with global hypokinesis, with positive circumferential pericardial effusion with no tamponade. Plan to continue ultrafiltration with HD   Elevated troponin-resolved as of 12/27/2021, (present on admission) Please see assessment and plan above     Subjective: Patient with chest pain, pleuritic in nature, has improved with hydromorphone but not back to baseline.   Objective BP (!) 123/92 (BP Location: Right Arm)    Pulse 66    Temp 99.7 F (37.6 C) (Oral)  Resp 12    SpO2 98%   Neurology patient is awake and alert ENT mild pallor  Cardiovascular  heart with S1 and S2 present and rhythmic, no rubs or gallops No lower extremity edema or JVD Pulmonary  lungs with rales at bases more on the right but no wheezing or rhonchi.  Abdominal soft and non tender Skin dry skin with no rashes.  Musculoskeletal   Data Reviewed:    Family Communication: no family at the bedside   Disposition: Status is: Inpatient  Remains inpatient appropriate because: renal replacement therapy and IV analgesics.            Author: Tawni Millers, MD 12/27/2021 11:08 AM  For on call review [email or web address].

## 2021-12-27 NOTE — ED Notes (Signed)
Breakfast tray will be ready in at 1030 per dietary services.

## 2021-12-27 NOTE — Progress Notes (Signed)
°  Echocardiogram 2D Echocardiogram has been performed.  Cathy [name] 12/27/2021, 10:01 AM

## 2021-12-27 NOTE — ED Notes (Signed)
Pt calling out asking for pain medicine. 10/10 abdominal pain. Pt curled up holding stomach, moaning.

## 2021-12-27 NOTE — Assessment & Plan Note (Addendum)
On amlodipine, clonidine and carvedilol for blood pressure control.

## 2021-12-27 NOTE — Assessment & Plan Note (Addendum)
Echocardiogram with moderate pericardial effusion, no clinical signs of tamponade. Patient is hemodynamically stable, will need close follow up.

## 2021-12-27 NOTE — Assessment & Plan Note (Addendum)
CT with right middle lobe opacity with air bronchogram, small effusion, but improved compared to prior CT chest.   Old records reviewed recent hospitalization she was treated for right lower lobe pneumonia, para pneumonic effusion requiring chest tube.  Apparently received antibiotic therapy from 01/13 to 01/17 with cefepime.  Patient is feeling better, her oxygenation is 98% on room air.   Continue with Augmentin as antibiotic therapy for complicated community acquired pneumonia.  She had a para pneumonic effusion that was treated with chest tube drainage.   Incentive spirometer, and out of bed to chair tid with meals.

## 2021-12-27 NOTE — Assessment & Plan Note (Signed)
·   Please see assessment and plan above °

## 2021-12-27 NOTE — Assessment & Plan Note (Addendum)
Suspected pleuritic chest pain from right middle lobe infiltrate.

## 2021-12-27 NOTE — Progress Notes (Signed)
Pharmacy Antibiotic Note  Cathy [name] is a 41 y.o. female with ESRD on HD TTSat admitted on 12/26/2021 with SOB, possible PNA.  Pharmacy has been consulted for Vancomycin and Cefepime  dosing.  Plan: Vancomycin 1000 mg IV now, then 500 mg IV after each HD Cefepime 2 g IV now, then 1 g IV q24h   Temp (24hrs), Avg:99.7 F (37.6 C), Min:99.7 F (37.6 C), Max:99.7 F (37.6 C)  Recent Labs  Lab 12/26/21 2049 12/27/21 0116  WBC 10.4  --   CREATININE 6.74*  --   LATICACIDVEN  --  0.8    Estimated Creatinine Clearance: 8.3 mL/min (A) (by C-G formula based on SCr of 6.74 mg/dL (H)).    No Known Allergies   Cathy [name] 12/27/2021 6:38 AM

## 2021-12-27 NOTE — Hospital Course (Addendum)
Mrs. Reeder was admitted to the hospital with the working diagnosis of right middle lobe pneumonia.  ° °40 yo female with the past medical history of systemic lupus erythematosus, ESRD, diastolic heart failure, HTN, chronic respiratory failure, chronic anemia and bipolar disorder who presented with dyspnea and chest pain.  °Recent hospitalization 01/13 to 01/17 2023 for right lowe lobe pneumonia, that required non invasive mechanical ventilation and chest tube placement. °Reported persistent chest pain since her discharge from the hospital, associated with dyspnea and productive cough. On her initial physical examination her blood pressure was 151/106, HR 85, RR 38 and oxygen saturation 99%,  ° °Na 134. K 4.3, Cl 91, bicarb 28, glucose 111, bun 29 and cr 6,74 °BNP >4,500 °High sensitive troponin 148 and 149  °Wbc 10.4, hgb 7,4, hct 25 and plt 442  °SARS COVID 19 negative  ° °Chest radiograph with cardiomegaly, small bilateral pleural effusions and bibasilar atelectasis, HD cathter in place right IJ,  ° °EKG 89 bpm, normal axis, normal intervals, sinus rhythm, with poor R wave progression, no significant ST segment or T wave changes.  ° °Patient was placed on antibiotic therapy and underwent renal replacement therapy °Follow up echocardiogram with persistent pericardial effusion.  ° °Elevated ESR and persistent chest pain requiring IV hydromorphone for pain control. 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 °Hydroxychloroquine.  ° ° °

## 2021-12-27 NOTE — ED Notes (Signed)
Breakfast tray delivered

## 2021-12-27 NOTE — Assessment & Plan Note (Addendum)
Patient has been off treatment and no follow up as outpatient. ESR is elevated and she continue to have chest and upper abdominal pain, requiring frequent doses of IV hydromorphone. Positive itchy skin in face, arms and back, similar symptoms to initial lupus flare.  Concern for lupus reactivation with pericarditis. Start patient with hydroxychloroquine  Will add oral prednisone and colchicine, continue inflammatory markers and will need close follow up with rheumatology, will call for consultation.

## 2021-12-27 NOTE — H&P (Signed)
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  Recently hospitalized at [facility] from 1/13 until 1/17.  Patient presented during that hospital stay with shortness of breath as well found to have right lower lobe pneumonia with moderate right-sided pleural effusion.  Patient also found to have a moderate to large pericardial effusion.  Patient treated with BiPAP therapy initially and IV antibiotics.  Patient underwent chest tube placement on 1/13 with removal on 01/16 and discharged home on 1/17.  Patient explains that since she was discharged from the hospital she has continued to experience chest discomfort.  Patient describes this chest discomfort as diffuse and radiating to her mid back.  Pain is moderate to severe in intensity and associated with shortness of breath.  Shortness of breath is mild to moderate intensity, worse with exertion and improved with rest.  Patient continues to experience a cough as well productive of small amounts of sputum.  Patient denies fever, sick contacts, recent travel or contact with confirmed COVID-19 infection.  Due to patient's persisting worsening symptoms the patient presented once again to [facility] emergency department for evaluation.  Upon evaluation in the  emergency department patient is noted to have a persisting area of consolidation with air bronchograms in the right middle lobe concerning for pneumonia.  Months patient is also noted to have a persisting moderate pericardial effusion, seemingly increased in size compared to the prior CT scan.  Patient is also been found to have troponins of 148 and 149, increased from what they were 2 weeks prior.  Due to concerns for a persisting pericardial effusion and possible infectious process with elevated troponins the hospitalist group has been called to assess the patient for admission to the hospital.  Review of Systems:   Review of Systems  Respiratory:  Positive for cough and shortness of breath.   Cardiovascular:  Positive for chest pain.  All other systems reviewed and are negative.  Past Medical History:  Diagnosis Date   Anasarca associated with disorder of kidney 06/08/2021   Anxiety    Asthma    Bipolar depression ([place])    Depression    Dyspnea    ESRD (end stage renal disease) on dialysis ([place]) 10/2020   GERD (gastroesophageal reflux disease)    Gonorrhea    Lupus (HCC)    Pelvic inflammatory disease (PID)    Pleural effusion 06/08/2021   Renal hypertension    Trichomonas infection     Past Surgical History:  Procedure Laterality Date   IR FLUORO GUIDE CV LINE RIGHT  11/30/2020   IR FLUORO GUIDE CV LINE RIGHT  06/13/2021   IR REMOVAL TUN CV CATH W/O FL  06/11/2021   IR US GUIDE VASC ACCESS RIGHT  11/30/2020   IR US GUIDE VASC ACCESS RIGHT  06/13/2021   RENAL BIOPSY  TEE WITHOUT CARDIOVERSION N/A 06/13/2021   Procedure: TRANSESOPHAGEAL ECHOCARDIOGRAM (TEE);  Surgeon: Josue Hector, MD;  Location: [place];  Service: Cardiovascular;  Laterality: N/A;   TUBAL LIGATION N/A 02/03/2019   Procedure: POST PARTUM TUBAL LIGATION;  Surgeon: Aletha Halim, MD;  Location: [facility] LD ORS;  Service: Gynecology;  Laterality: N/A;     reports that she quit smoking about 14 months ago.  Her smoking use included cigarettes. She smoked an average of .25 packs per day. She has never used smokeless tobacco. She reports that she does not currently use alcohol. She reports that she does not currently use drugs after having used the following drugs: Marijuana.  No Known Allergies  Family History  Problem Relation Age of Onset   Diabetes Maternal Grandmother    Hypertension Maternal Grandmother    Diabetes Maternal Grandfather    Hypertension Maternal Grandfather    Diabetes Paternal Grandmother    Hypertension Paternal Grandmother    Diabetes Paternal Grandfather    Hypertension Paternal Grandfather      Prior to Admission medications   Medication Sig Start Date End Date Taking? Authorizing Provider  acetaminophen (TYLENOL) 500 MG tablet Take 1,000 mg by mouth every 6 (six) hours as needed for moderate pain.    [provider]  albuterol (VENTOLIN HFA) 108 (90 Base) MCG/ACT inhaler Inhale 2 puffs into the lungs every 6 (six) hours as needed for wheezing or shortness of breath. 12/17/21 12/17/22  Rai, Ripudeep K, MD  amLODipine (NORVASC) 10 MG tablet Take 1 tablet (10 mg total) by mouth daily. 12/17/21   Rai, Vernelle Emerald, MD  carvedilol (COREG) 12.5 MG tablet Take 1 tablet (12.5 mg total) by mouth 2 (two) times daily with a meal. 12/17/21   Rai, Ripudeep K, MD  hydrALAZINE (APRESOLINE) 25 MG tablet Take 1 tablet (25 mg total) by mouth 3 (three) times daily. 12/17/21   Rai, Vernelle Emerald, MD  hydrOXYzine (ATARAX) 25 MG tablet Take 1 tablet (25 mg total) by mouth 3 (three) times daily as needed for itching or anxiety. 12/17/21   Rai, Vernelle Emerald, MD  ipratropium (ATROVENT HFA) 17 MCG/ACT inhaler Inhale 2 puffs into the lungs every 6 (six) hours as needed for wheezing. 12/17/21 12/17/22  Rai, Vernelle Emerald, MD  methocarbamol (ROBAXIN) 500 MG tablet Take 1 tablet (500 mg total) by mouth every 6 (six) hours as needed for muscle spasms. 12/17/21   Rai, Vernelle Emerald, MD  Nutritional Supplements  (,FEEDING SUPPLEMENT, PROSOURCE PLUS) liquid Take 30 mLs by mouth 3 (three) times daily between meals. Patient not taking: Reported on 12/14/2021 06/14/21   Edwin Dada, MD  sevelamer carbonate (RENVELA) 800 MG tablet Take 800 mg by mouth 3 (three) times daily. 08/24/21   [provider]  fenofibrate 160 MG tablet Take 1 tablet (160 mg total) by mouth daily. 12/31/20 01/20/21  Elmarie Shiley, MD    Physical Exam: Vitals:   12/27/21 0738 12/27/21 0810 12/27/21 0811 12/27/21 0900  BP: (!) 171/117 (!) 167/107  (!) 175/118  Pulse:   87 88  Resp:    13  Temp:      TempSrc:      SpO2:    97%   Constitutional: Awake alert and oriented x3, patient is in distress due to chest discomfort. Skin: no rashes, no lesions, good skin turgor noted. Eyes: Pupils are equally reactive to light.  No evidence of scleral icterus or conjunctival pallor.  ENMT: Moist mucous membranes noted.  Posterior pharynx  clear of any exudate or lesions.   Neck: normal, supple, no masses, no thyromegaly.  No evidence of jugular venous distension.   Respiratory: Bibasilar and mid field rales.  Normal respiratory effort. No accessory muscle use.  Cardiovascular: Regular rate and rhythm, 2 out of 6 systolic murmur, No extremity edema. 2+ pedal pulses. No carotid bruits.  Chest:   Nontender without crepitus or deformity.   Back:   Nontender without crepitus or deformity. Abdomen: Notable generalized abdominal tenderness.  Abdomen is soft however.  Positive bowel sounds noted in all quadrants.   Musculoskeletal: No joint deformity upper and lower extremities. Good ROM, no contractures. Normal muscle tone.  Neurologic: CN 2-12 grossly intact. Sensation intact.  Patient moving all 4 extremities spontaneously.  Patient is following all commands.  Patient is responsive to verbal stimuli.   Psychiatric: Patient exhibits anxious mood with labile affect.  Patient seems to possess insight as to their current situation.      Labs on Admission: I have personally reviewed following labs and imaging studies -   CBC: Recent Labs  Lab 12/26/21 2049  WBC 10.4  HGB 7.5*  HCT 25.0*  MCV 87.7  PLT 759*   Basic Metabolic Panel: Recent Labs  Lab 12/26/21 2049  NA 134*  K 4.3  CL 91*  CO2 28  GLUCOSE 111*  BUN 29*  CREATININE 6.74*  CALCIUM 8.1*   GFR: Estimated Creatinine Clearance: 8.3 mL/min (A) (by C-G formula based on SCr of 6.74 mg/dL (H)). Liver Function Tests: Recent Labs  Lab 12/27/21 0116  AST 19  ALT 19  ALKPHOS 318*  BILITOT 0.6  PROT 6.9  ALBUMIN 2.5*   Recent Labs  Lab 12/27/21 0116  LIPASE 40   No results for input(s): AMMONIA in the last 168 hours. Coagulation Profile: Recent Labs  Lab 12/27/21 0340  INR 1.2   Cardiac Enzymes: No results for input(s): CKTOTAL, CKMB, CKMBINDEX, TROPONINI in the last 168 hours. BNP (last 3 results) No results for input(s): PROBNP in the last 8760 hours. HbA1C: No results for input(s): HGBA1C in the last 72 hours. CBG: No results for input(s): GLUCAP in the last 168 hours. Lipid Profile: No results for input(s): CHOL, HDL, LDLCALC, TRIG, CHOLHDL, LDLDIRECT in the last 72 hours. Thyroid Function Tests: No results for input(s): TSH, T4TOTAL, FREET4, T3FREE, THYROIDAB in the last 72 hours. Anemia Panel: Recent Labs    12/27/21 0340  VITAMINB12 265  FOLATE 7.9  TIBC 237*  IRON 17*   Urine analysis:    Component Value Date/Time   COLORURINE YELLOW 11/29/2020 2054   APPEARANCEUR HAZY (A) 11/29/2020 2054   LABSPEC 1.015 11/29/2020 2054   PHURINE 6.0 11/29/2020 2054   GLUCOSEU NEGATIVE 11/29/2020 2054   HGBUR MODERATE (A) 11/29/2020 2054   [place] NEGATIVE 11/29/2020 2054   [place] NEGATIVE 11/29/2020 2054   PROTEINUR >=300 (A) 11/29/2020 2054   UROBILINOGEN 0.2 10/17/2020 1026   NITRITE NEGATIVE 11/29/2020 2054   LEUKOCYTESUR NEGATIVE 11/29/2020 2054    Radiological Exams on Admission - Personally Reviewed: DG  Chest 2 View  Result Date: 12/26/2021 CLINICAL DATA:  Chest pain. EXAM: CHEST - 2 VIEW COMPARISON:  Chest radiograph dated 12/17/2021. FINDINGS: Right-sided dialysis catheter in similar position. There is cardiomegaly. Small bilateral pleural effusions, left greater right, with bibasilar atelectasis or infiltrate. No pneumothorax. No acute osseous pathology. IMPRESSION: 1. Small bilateral pleural effusions with bibasilar atelectasis or infiltrate. Overall similar to prior radiograph. 2. Cardiomegaly. Electronically Signed   By: Milas Hock  Radparvar M.D.   On: 12/26/2021 21:32   CT CHEST ABDOMEN PELVIS WO CONTRAST  Result Date: 12/27/2021 CLINICAL DATA:  None traumatic chest wall pain. Concern for infection or inflammation. EXAM: CT CHEST, ABDOMEN AND PELVIS WITHOUT CONTRAST TECHNIQUE: Multidetector CT imaging of the chest, abdomen and pelvis was performed following the standard protocol without IV contrast. RADIATION DOSE REDUCTION: This exam was performed according to the departmental dose-optimization program which includes automated exposure control, adjustment of the mA and/or kV according to patient size and/or use of iterative reconstruction technique. COMPARISON:  CT of the abdomen pelvis dated 12/13/2021. FINDINGS: Evaluation of this exam is limited in the absence of intravenous contrast. Evaluation is also limited due to streak artifact caused by overlying support wires. CT CHEST FINDINGS Cardiovascular: There is cardiomegaly. Dialysis catheter with tip at the cavoatrial junction. There is moderate pericardial effusion measuring 2.3 cm in thickness, increased since the prior CT. Correlation with echocardiogram recommended to exclude cardiac tamponade. The thoracic aorta and central pulmonary arteries are grossly unremarkable but poorly visualized. Mediastinum/Nodes: No hilar or mediastinal adenopathy. The esophagus and the thyroid gland are grossly unremarkable. No mediastinal fluid collection.  Lungs/Pleura: Small left pleural effusion. There is diffuse interstitial and interlobular septal prominence of the left lung concerning for asymmetric edema. Patchy area of consolidation with air bronchogram in the right middle lobe consistent with atelectasis or pneumonia. No pneumothorax. The central airways are patent. Musculoskeletal: No chest wall mass or suspicious bone lesions identified. CT ABDOMEN PELVIS FINDINGS No intra-abdominal free air.  Small ascites. Hepatobiliary: The liver is unremarkable. No intrahepatic biliary dilatation. No calcified gallstone. Pancreas: Unremarkable. No pancreatic ductal dilatation or surrounding inflammatory changes. Spleen: Normal in size without focal abnormality. Adrenals/Urinary Tract: The adrenal glands unremarkable. There is no hydronephrosis or nephrolithiasis on either side. The visualized ureters and urinary bladder appear unremarkable. Stomach/Bowel: There is no bowel obstruction. The appendix is unremarkable as visualized. Vascular/Lymphatic: The abdominal aorta and IVC are grossly unremarkable. No portal venous gas. Reproductive: The uterus is anteverted. Bilateral tubal ligation clips. Other: None Musculoskeletal: No acute or significant osseous findings. IMPRESSION: 1. Cardiomegaly with moderate pericardial effusion, increased since the prior CT. Correlation with echocardiogram recommended to exclude cardiac tamponade. 2. Small left pleural effusion. 3. Patchy area of consolidation with air bronchogram in the right middle lobe consistent with atelectasis or pneumonia. 4. Small ascites. 5. No hydronephrosis or nephrolithiasis. 6. No bowel obstruction. Electronically Signed   By: Anner Crete M.D.   On: 12/27/2021 02:36    EKG: Personally reviewed.  Rhythm is normal sinus rhythm with heart rate of 89 bpm.  No dynamic ST segment changes appreciated.  Assessment/Plan  * Pneumonia of right middle lobe due to infectious organism- (present on  admission) Patient presenting with continued shortness of breath since recent hospitalization as well as complaints of chest and abdominal discomfort CT imaging revealing persisting right middle lobe infiltrate with air bronchograms concerning for persisting pneumonia Placing patient back on intravenous cefepime Supplemental oxygen for bouts of hypoxia Blood cultures obtained As needed bronchodilator therapy for associated shortness of breath and wheezing  Pleuritic chest pain- (present on admission) Patient also presenting with exquisite anterior chest pain that seems to worsen with deep inspiration or cough suggestive of pleuritic nature.   Pain also seems to be reproducible on palpation suggesting a musculoskeletal component Mild troponins are slightly higher than they were during last hospitalization, they are still extremely low for a patient with known pericardial effusion and end-stage renal disease.  In light of an enlarging pericardial effusion on CT imaging, will repeat a limited echocardiogram to truly determine whether this effusion is enlarging, please see remainder of assessment and plan above.   Hypertensive emergency- (present on admission) Markedly elevated blood pressures in the emergency department in the setting of elevated troponin and chest discomfort (albeit this is likely not ischemic) Resuming home regimen of oral antihypertensives As needed intravenous hydralazine for excessively elevated blood pressures   Pericardial effusion- (present on admission) Pericardial effusion, seemingly chronic as it can be traced all the way back to echocardiograms in 2019 Multifactorial likely due to advanced cardiomyopathy, end-stage renal disease and lupus CT imaging during this presentation identifies that pericardial effusion may be enlarging With an associated rising troponin it does bring the question as to whether patient is developing pericarditis, although I believe that this  is quite unlikely considering the chronicity of the pericardial effusion. Furthermore,  chest pain does not seem consistent with chest pain typically seen with pericarditis and seems to be more pleuritic or musculoskeletal EKG does not seem consistent with pericarditis No clinical evidence of tamponade Continue to monitor on telemetry Obtaining repeat limited echocardiogram to truly identify whether effusion is enlarging If there is substantial enlargement of the effusion or if patient develops any clinical evidence of tamponade we will obtain cardiology consultation.  Chronic combined systolic and diastolic congestive heart failure (HCC)- (present on admission) Other than patient's pericardial effusion, no significant evidence of cardiogenic volume overload Continue home regimen of maintenance torsemide Continue dialysis as previously scheduled  Elevated troponin- (present on admission) Please see assessment and plan above  ESRD on hemodialysis [facility]) If patient is anticipated to remain hospitalized on Saturday we will formally consult nephrology Saturday morning  Lupus (systemic lupus erythematosus) ([place])- (present on admission) Patient has a several year history of systemic lupus erythematosus and is currently not on treatment Patient has suffered complications such as lupus nephritis resulting in her development of end-stage renal disease Will attempt to arrange for patient to have new patient follow-up appointment with rheumatology at time of discharge.       Code Status:  Full code  code status decision has been confirmed with: patient Family Communication: deferred   Status is: Observation  The patient remains OBS appropriate and will d/c before 2 midnights.       Vernelle Emerald MD [facility] Pager [phone]  If 7PM-7AM, please contact night-coverage [email or web address] Use universal [place] password for that web site. If you do not have the password,  please call the hospital operator.  12/27/2021, 9:44 AM

## 2021-12-27 NOTE — Assessment & Plan Note (Addendum)
No clinical signs of heart failure exacerbation, echocardiogram with decreased LV EF 25% with global hypokinesis, with positive circumferential pericardial effusion with no tamponade.  Medical management with carvedilol, continue ultrafiltration for volume management.

## 2021-12-27 NOTE — Assessment & Plan Note (Addendum)
Hyponatremia and hyperkalemia.  Patient tolerated renal replacement therapy well yesterday.   Today K is 5,1 and serum bicarbonate is 27.   Metabolic bone disease continue with phsolo and doxercalciferol.  Diuresis with torsemide for diuresis tid

## 2021-12-28 DIAGNOSIS — N186 End stage renal disease: Secondary | ICD-10-CM | POA: Diagnosis not present

## 2021-12-28 DIAGNOSIS — N189 Chronic kidney disease, unspecified: Secondary | ICD-10-CM

## 2021-12-28 DIAGNOSIS — M329 Systemic lupus erythematosus, unspecified: Secondary | ICD-10-CM | POA: Diagnosis not present

## 2021-12-28 DIAGNOSIS — D631 Anemia in chronic kidney disease: Secondary | ICD-10-CM

## 2021-12-28 DIAGNOSIS — I3139 Other pericardial effusion (noninflammatory): Secondary | ICD-10-CM | POA: Diagnosis not present

## 2021-12-28 DIAGNOSIS — J189 Pneumonia, unspecified organism: Secondary | ICD-10-CM | POA: Diagnosis not present

## 2021-12-28 LAB — COMPREHENSIVE METABOLIC PANEL
ALT: 15 U/L (ref 0–44)
AST: 13 U/L — ABNORMAL LOW (ref 15–41)
Albumin: 2.2 g/dL — ABNORMAL LOW (ref 3.5–5.0)
Alkaline Phosphatase: 265 U/L — ABNORMAL HIGH (ref 38–126)
Anion gap: 13 (ref 5–15)
BUN: 49 mg/dL — ABNORMAL HIGH (ref 6–20)
CO2: 25 mmol/L (ref 22–32)
Calcium: 8.1 mg/dL — ABNORMAL LOW (ref 8.9–10.3)
Chloride: 93 mmol/L — ABNORMAL LOW (ref 98–111)
Creatinine, Ser: 9.48 mg/dL — ABNORMAL HIGH (ref 0.44–1.00)
GFR, Estimated: 5 mL/min — ABNORMAL LOW (ref >60)
Glucose, Bld: 107 mg/dL — ABNORMAL HIGH (ref 70–99)
Potassium: 5.4 mmol/L — ABNORMAL HIGH (ref 3.5–5.1)
Sodium: 131 mmol/L — ABNORMAL LOW (ref 135–145)
Total Bilirubin: 0.3 mg/dL (ref 0.3–1.2)
Total Protein: 6 g/dL — ABNORMAL LOW (ref 6.5–8.1)

## 2021-12-28 LAB — CBC WITH DIFFERENTIAL/PLATELET
Abs Immature Granulocytes: 0.05 10*3/uL (ref 0.00–0.07)
Basophils Absolute: 0.1 10*3/uL (ref 0.0–0.1)
Basophils Relative: 1 %
Eosinophils Absolute: 0.2 10*3/uL (ref 0.0–0.5)
Eosinophils Relative: 2 %
HCT: 21.6 % — ABNORMAL LOW (ref 36.0–46.0)
Hemoglobin: 6.8 g/dL — CL (ref 12.0–15.0)
Immature Granulocytes: 1 %
Lymphocytes Relative: 5 %
Lymphs Abs: 0.4 10*3/uL — ABNORMAL LOW (ref 0.7–4.0)
MCH: 27.1 pg (ref 26.0–34.0)
MCHC: 31.5 g/dL (ref 30.0–36.0)
MCV: 86.1 fL (ref 80.0–100.0)
Monocytes Absolute: 0.7 10*3/uL (ref 0.1–1.0)
Monocytes Relative: 8 %
Neutro Abs: 7.6 10*3/uL (ref 1.7–7.7)
Neutrophils Relative %: 83 %
Platelets: 352 10*3/uL (ref 150–400)
RBC: 2.51 MIL/uL — ABNORMAL LOW (ref 3.87–5.11)
RDW: 15.6 % — ABNORMAL HIGH (ref 11.5–15.5)
WBC: 9 10*3/uL (ref 4.0–10.5)
nRBC: 0 % (ref 0.0–0.2)

## 2021-12-28 LAB — HEMOGLOBIN AND HEMATOCRIT, BLOOD
HCT: 23.1 % — ABNORMAL LOW (ref 36.0–46.0)
Hemoglobin: 7.2 g/dL — ABNORMAL LOW (ref 12.0–15.0)

## 2021-12-28 LAB — MAGNESIUM: Magnesium: 2.1 mg/dL (ref 1.7–2.4)

## 2021-12-28 LAB — ANTI-DNA ANTIBODY, DOUBLE-STRANDED: ds DNA Ab: 2 IU/mL (ref 0–9)

## 2021-12-28 LAB — PREPARE RBC (CROSSMATCH)

## 2021-12-28 MED ORDER — ACETAMINOPHEN 325 MG PO TABS: 650.0000 mg | ORAL_TABLET | Freq: Once | ORAL | Status: DC

## 2021-12-28 MED ORDER — DIPHENHYDRAMINE HCL 25 MG PO CAPS
25.0000 mg | ORAL_CAPSULE | Freq: Once | ORAL | Status: AC
  Administered 2021-12-28: 25 mg via ORAL
  Filled 2021-12-28: qty 1

## 2021-12-28 MED ORDER — SODIUM CHLORIDE 0.9% IV SOLUTION: Freq: Once | INTRAVENOUS | Status: DC

## 2021-12-28 MED ORDER — HEPARIN SODIUM (PORCINE) 1000 UNIT/ML IJ SOLN
INTRAMUSCULAR | Status: AC
  Administered 2021-12-28: 3000 [IU]
  Filled 2021-12-28: qty 3

## 2021-12-28 NOTE — Progress Notes (Addendum)
Progress Note   Patient: Cathy [name] ITG:[id number] DOB: 1981/05/09 DOA: 12/26/2021     1 DOS: the patient was seen and examined on 12/28/2021   Brief hospital course: Mrs. Sandefer was admitted to the hospital with the working diagnosis of right middle lobe pneumonia.   41 yo female with the past medical history of systemic lupus erythematosus, ESRD, diastolic heart failure, HTN, chronic respiratory failure, chronic anemia and bipolar disorder who presented with dyspnea and chest pain.  Recent hospitalization 01/13 to 01/17 2023 for right lowe lobe pneumonia, that required non invasive mechanical ventilation and chest tube placement. Reported persistent chest pain since her discharge from the hospital, associated with dyspnea and productive cough. On her initial physical examination her blood pressure was 151/106, HR 85, RR 38 and oxygen saturation 99%,   Na 134. K 4.3, Cl 91, bicarb 28, glucose 111, bun 29 and cr 6,74 BNP >4,500 High sensitive troponin 148 and 149  Wbc 10.4, hgb 7,4, hct 25 and plt 442  SARS COVID 19 negative   Chest radiograph with cardiomegaly, small bilateral pleural effusions and bibasilar atelectasis, HD cathter in place right IJ,   EKG 89 bpm, normal axis, normal intervals, sinus rhythm, with poor R wave progression, no significant ST segment or T wave changes.   Assessment and Plan * Pneumonia of right middle lobe due to infectious organism- (present on admission) CT with right middle lobe opacity with air bronchogram, small effusion, but improved compared to prior CT chest.   Old records reviewed recent hospitalization she was treated for right lower lobe pneumonia, para pneumonic effusion requiring chest tube.  Apparently received antibiotic therapy from 01/13 to 01/17 with cefepime.  Her chest pain is better, she has no nausea or vomiting, and tolerated hemodialysis well today.  Her wbc is 9,0.   Plan to continue with Augmentin as antibiotic therapy for  complicated community acquired pneumonia.  Plan for 8 more days, she had para pneumonic effusion that was treated with chest tube drainage.   Continue incentive spirometer, and out of bed to chair tid with meals.  ESRD on hemodialysis (HCC) Hyponatremia and hyperkalemia.  Patient had renal replacement therapy today with good toleration Plan to check electrolytes in am.  No signs of hypervolemia today.   For metabolic bone disease continue with phsolo and doxercalciferol.  Continue with torsemide for diuresis tid   Pericardial effusion- (present on admission) Echocardiogram with moderate pericardial effusion, no clinical signs of tamponade. 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 Her chest pain is better, and low suspicion for acute pericarditis.   Plan to follow up echocardiogram as outpatient,.   Lupus (systemic lupus erythematosus) ([place])- (present on admission) No acute flare, patient not on immunosuppressive therapy Will need outpatient follow up.    Hypertensive emergency- (present on admission) Continue blood pressure control with amlodipine, clonidine and carvedilol.    Pleuritic chest pain-resolved as of 12/28/2021, (present on admission) Suspected pleuritic chest pain from right middle lobe infiltrate.    Anemia of chronic kidney failure Iron deficiency.  Hgb today down to 6,8, iron stores with serum iron of 17, tibc 237, transferrin saturation 7, consistent with anemia of iron deficiency combined with anemia of chronic renal disease.   Patient had 1 units PRBC ordered today.  Plan to follow up H&H in am IV iron and Epo per nephrology team No clinica signs of bleeding.   Chronic combined systolic and diastolic congestive heart failure ([place])- (present on admission) No clinical signs of heart failure exacerbation,  echocardiogram with decreased LV EF 25% with global hypokinesis, with positive circumferential pericardial effusion with no tamponade.  Continue volume  control with ultrafiltration Continue heart failure management with carvedilol.   Elevated troponin-resolved as of 12/27/2021, (present on admission) Please see assessment and plan above     Subjective: Patient with no nausea or vomiting, her chest pain has been improving, no dyspnea.   Objective BP (!) 162/109 (BP Location: Right Arm)    Pulse 80    Temp 98.3 F (36.8 C) (Oral)    Resp 20    Wt 54.6 kg    SpO2 98%    BMI 18.85 kg/m   Neurology awake and alert ENT mild pallor  Cardiovascular heart with S1 and S2 present and rhythmic, no gallop, positive rub.  Pulmonary  with no wheezing or rhonchi, no rales on anterior auscultation  Abdominal soft and non tender Skin Musculoskeletal   Data Reviewed:    Family Communication: no family at the bedside   Disposition: Status is: Inpatient  Remains inpatient appropriate because: H&H monitoring along with chest pain monitoring, plan for possible dc home in am,            Author: Tawni Millers, MD 12/28/2021 3:29 PM  For on call review [email or web address].

## 2021-12-28 NOTE — Progress Notes (Signed)
Pt spiked a temp of 102. Md Dr. Tonie Griffith notified and ordered to proceed with blood transfusion after giving tylenol. Pt received tylenol 650 mg po at 0555. Temp came dow to 99 at 0635. Blood transfusion started at 0639 am.

## 2021-12-28 NOTE — Assessment & Plan Note (Addendum)
Iron deficiency.  Iron stores with serum iron of 17, tibc 237, transferrin saturation 7, consistent with anemia of iron deficiency combined with anemia of chronic renal disease.   SP 1 units PRBC transfusion    Follow up hgb is 7,4 and hct is 23,8 Plan to continue with EPO and iron supplementation

## 2021-12-28 NOTE — Progress Notes (Signed)
[facility] Progress Note   Subjective: Seen on HD. Audible pericardial rub today. Moderate pericardial effusion by ECHO 12/27/2021, Denies SOB/Chest pain. HGB 6.8. S/P 1 units PRBCs. Febrile over night.   Objective Vitals:   12/28/21 0856 12/28/21 0900 12/28/21 0930 12/28/21 1000  BP: 121/88 (!) 123/91 122/90 (!) 123/96  Pulse:      Resp: 20 (!) 21 (!) 21 (!) 21  Temp: 98.1 F (36.7 C)     TempSrc:      SpO2:      Weight:       Physical Exam General: Chronically ill appearing female in NAD Heart: S1,S2 + pericardial rub. SR HR 60s.  Lungs: CTAB, slightly decreased in bases. No WOB.  Abdomen: Slightly protuberant but no ascites, NABS Extremities: No LE edema Dialysis Access: RIJ TDC drsg intact.    Additional Objective Labs: Basic Metabolic Panel: Recent Labs  Lab 12/26/21 2049 12/28/21 0232  NA 134* 131*  K 4.3 5.4*  CL 91* 93*  CO2 28 25  GLUCOSE 111* 107*  BUN 29* 49*  CREATININE 6.74* 9.48*  CALCIUM 8.1* 8.1*   Liver Function Tests: Recent Labs  Lab 12/27/21 0116 12/28/21 0232  AST 19 13*  ALT 19 15  ALKPHOS 318* 265*  BILITOT 0.6 0.3  PROT 6.9 6.0*  ALBUMIN 2.5* 2.2*   Recent Labs  Lab 12/27/21 0116  LIPASE 40   CBC: Recent Labs  Lab 12/26/21 2049 12/28/21 0232  WBC 10.4 9.0  NEUTROABS  --  7.6  HGB 7.5* 6.8*  HCT 25.0* 21.6*  MCV 87.7 86.1  PLT 442* 352   Blood Culture    Component Value Date/Time   SDES BLOOD LEFT FOREARM 12/27/2021 0023   SPECREQUEST  12/27/2021 0023    BOTTLES DRAWN AEROBIC AND ANAEROBIC Blood Culture results may not be optimal due to an excessive volume of blood received in culture bottles   CULT  12/27/2021 0023    NO GROWTH 1 DAY Performed at [facility], [place] [address], [place], [place] [address]    REPTSTATUS PENDING 12/27/2021 0023    Cardiac Enzymes: No results for input(s): CKTOTAL, CKMB, CKMBINDEX, TROPONINI in the last 168 hours. CBG: No results for input(s): GLUCAP in  the last 168 hours. Iron Studies:  Recent Labs    12/27/21 0340  IRON 17*  TIBC 237*   @lablastinr3 @ Studies/Results: DG Chest 2 View  Result Date: 12/26/2021 CLINICAL DATA:  Chest pain. EXAM: CHEST - 2 VIEW COMPARISON:  Chest radiograph dated 12/17/2021. FINDINGS: Right-sided dialysis catheter in similar position. There is cardiomegaly. Small bilateral pleural effusions, left greater right, with bibasilar atelectasis or infiltrate. No pneumothorax. No acute osseous pathology. IMPRESSION: 1. Small bilateral pleural effusions with bibasilar atelectasis or infiltrate. Overall similar to prior radiograph. 2. Cardiomegaly. Electronically Signed   By: Anner Crete M.D.   On: 12/26/2021 21:32   ECHOCARDIOGRAM LIMITED  Result Date: 12/27/2021    ECHOCARDIOGRAM LIMITED REPORT   Patient Name:   Cathy [name] Date of Exam: 12/27/2021 Medical Rec #:  [id number]        Height:       67.0 in Accession #:    [id number]       Weight:       102.3 lb Date of Birth:  July 16, 1981       BSA:          1.521 m Patient Age:    41 years  BP:           175/118 mmHg Patient Gender: F                HR:           73 bpm. Exam Location:  Inpatient Procedure: Limited Echo, Limited Color Doppler and Cardiac Doppler Indications:    pericardial effusion  History:        Patient has prior history of Echocardiogram examinations, most                 recent 12/13/2021. End stage renal disease. Lupus.; Risk                 Factors:Hypertension.  Sonographer:    Johny Chess RDCS Referring Phys: [id number] [place]  1. Left ventricular ejection fraction, by estimation, is 25 to 30%. The left ventricle has severely decreased function. The left ventricle demonstrates global hypokinesis.  2. Right ventricular systolic function is moderately reduced. The right ventricular size is normal. There is normal pulmonary artery systolic pressure. The estimated right ventricular systolic pressure is 07.3 mmHg.  3.  Moderate pericardial effusion. The pericardial effusion is circumferential.  4. The mitral valve is normal in structure. Mild mitral valve regurgitation. No evidence of mitral stenosis.  5. The aortic valve is normal in structure. Aortic valve regurgitation is trivial. No aortic stenosis is present.  6. The inferior vena cava is normal in size with greater than 50% respiratory variability, suggesting right atrial pressure of 3 mmHg. FINDINGS  Left Ventricle: Left ventricular ejection fraction, by estimation, is 25 to 30%. The left ventricle has severely decreased function. The left ventricle demonstrates global hypokinesis. The left ventricular internal cavity size was normal in size. There is no left ventricular hypertrophy. Right Ventricle: The right ventricular size is normal. No increase in right ventricular wall thickness. Right ventricular systolic function is moderately reduced. There is normal pulmonary artery systolic pressure. The tricuspid regurgitant velocity is 2.74 m/s, and with an assumed right atrial pressure of 3 mmHg, the estimated right ventricular systolic pressure is 71.0 mmHg. Left Atrium: Left atrial size was normal in size. Right Atrium: Right atrial size was normal in size. Pericardium: A moderately sized pericardial effusion is present. The pericardial effusion is circumferential. Mitral Valve: The mitral valve is normal in structure. Mild mitral valve regurgitation. No evidence of mitral valve stenosis. Tricuspid Valve: The tricuspid valve is normal in structure. Tricuspid valve regurgitation is mild . No evidence of tricuspid stenosis. Aortic Valve: The aortic valve is normal in structure. Aortic valve regurgitation is trivial. No aortic stenosis is present. Pulmonic Valve: The pulmonic valve was normal in structure. Pulmonic valve regurgitation is trivial. No evidence of pulmonic stenosis. Aorta: The aortic root is normal in size and structure. Venous: The inferior vena cava is normal in  size with greater than 50% respiratory variability, suggesting right atrial pressure of 3 mmHg. IAS/Shunts: No atrial level shunt detected by color flow Doppler. LEFT VENTRICLE PLAX 2D LVIDd:         4.90 cm Diastology LVIDs:         4.00 cm LV e' medial:  6.31 cm/s LV PW:         1.50 cm LV e' lateral: 5.55 cm/s LV IVS:        1.10 cm  IVC IVC diam: 1.50 cm  AORTA Ao Asc diam: 3.20 cm TRICUSPID VALVE TR Peak grad:   30.0 mmHg TR Vmax:  274.00 cm/s Fransico Him MD Electronically signed by Fransico Him MD Signature Date/Time: 12/27/2021/10:32:35 AM    Final    CT CHEST ABDOMEN PELVIS WO CONTRAST  Result Date: 12/27/2021 CLINICAL DATA:  None traumatic chest wall pain. Concern for infection or inflammation. EXAM: CT CHEST, ABDOMEN AND PELVIS WITHOUT CONTRAST TECHNIQUE: Multidetector CT imaging of the chest, abdomen and pelvis was performed following the standard protocol without IV contrast. RADIATION DOSE REDUCTION: This exam was performed according to the departmental dose-optimization program which includes automated exposure control, adjustment of the mA and/or kV according to patient size and/or use of iterative reconstruction technique. COMPARISON:  CT of the abdomen pelvis dated 12/13/2021. FINDINGS: Evaluation of this exam is limited in the absence of intravenous contrast. Evaluation is also limited due to streak artifact caused by overlying support wires. CT CHEST FINDINGS Cardiovascular: There is cardiomegaly. Dialysis catheter with tip at the cavoatrial junction. There is moderate pericardial effusion measuring 2.3 cm in thickness, increased since the prior CT. Correlation with echocardiogram recommended to exclude cardiac tamponade. The thoracic aorta and central pulmonary arteries are grossly unremarkable but poorly visualized. Mediastinum/Nodes: No hilar or mediastinal adenopathy. The esophagus and the thyroid gland are grossly unremarkable. No mediastinal fluid collection. Lungs/Pleura: Small  left pleural effusion. There is diffuse interstitial and interlobular septal prominence of the left lung concerning for asymmetric edema. Patchy area of consolidation with air bronchogram in the right middle lobe consistent with atelectasis or pneumonia. No pneumothorax. The central airways are patent. Musculoskeletal: No chest wall mass or suspicious bone lesions identified. CT ABDOMEN PELVIS FINDINGS No intra-abdominal free air.  Small ascites. Hepatobiliary: The liver is unremarkable. No intrahepatic biliary dilatation. No calcified gallstone. Pancreas: Unremarkable. No pancreatic ductal dilatation or surrounding inflammatory changes. Spleen: Normal in size without focal abnormality. Adrenals/Urinary Tract: The adrenal glands unremarkable. There is no hydronephrosis or nephrolithiasis on either side. The visualized ureters and urinary bladder appear unremarkable. Stomach/Bowel: There is no bowel obstruction. The appendix is unremarkable as visualized. Vascular/Lymphatic: The abdominal aorta and IVC are grossly unremarkable. No portal venous gas. Reproductive: The uterus is anteverted. Bilateral tubal ligation clips. Other: None Musculoskeletal: No acute or significant osseous findings. IMPRESSION: 1. Cardiomegaly with moderate pericardial effusion, increased since the prior CT. Correlation with echocardiogram recommended to exclude cardiac tamponade. 2. Small left pleural effusion. 3. Patchy area of consolidation with air bronchogram in the right middle lobe consistent with atelectasis or pneumonia. 4. Small ascites. 5. No hydronephrosis or nephrolithiasis. 6. No bowel obstruction. Electronically Signed   By: Anner Crete M.D.   On: 12/27/2021 02:36   Medications:   heparin sodium (porcine)       (feeding supplement) PROSource Plus  30 mL Oral TID BM   sodium chloride   Intravenous Once   acetaminophen  650 mg Oral Once   amLODipine  10 mg Oral Daily   amoxicillin-clavulanate  2 tablet Oral Q12H    calcium acetate  1,334 mg Oral TID WC   carvedilol  12.5 mg Oral BID WC   Chlorhexidine Gluconate Cloth  6 each Topical Q0600   cloNIDine  0.3 mg Oral Q8H   darbepoetin (ARANESP) injection - DIALYSIS  200 mcg Intravenous Q Sat-HD   doxercalciferol  5 mcg Intravenous Q T,Th,Sa-HD   heparin  5,000 Units Subcutaneous Q8H   multivitamin  1 tablet Oral QHS   pantoprazole  40 mg Oral Daily   torsemide  20 mg Oral TID     Dialysis Orders: Center: [facility] 4h  2/2 bath 400/800 56kg  Hep none   R TDC  - hect 5 ug  IV tiw  - mircera 200 mcg IV q 2 weeks (last 11/21/2022)  - venofer 100 mg IV X 5 doses (1/5 given 12/26/2021)   Assessment/Plan:  RML PNA: BC NGTD, ABX per primary  HFrEF/ Possible volume overload: EF 25-30% 12/27/2021. Elevated troponin, flat trend. Left 4.8 kg above OP EDW. UF as tolerated today.   Pericardial Effusion: Chronic issue but increased this admission. No evidence of tamponade. EKG doesn't appear compatible with pericarditis. Per primary.   ESRD -  T,Th,S HD 12/28/2021 on schedule. 2.0 K bath.   Hypertension/volume  - BP elevated in ED, now appears controlled. Continue home meds. No evidence of volume overload by exam, small bilateral pleural effusions on Xray.   Anemia  - HGB 7.5 on admission, down to 6.8 this AM. S/P 1 unit PRBCs. No recent OP ESA. Give ESA today. Had started OP Fe load, Tsat 7 here. Hold Fe load until infectious process ruled out.   Metabolic bone disease -  continue binders, VDRA.   Nutrition - Low albumin. Start protein supps. RENAL DIET with fluid restrictions. Lupus-per primary   [employer] Kahlen Morais NP-C 12/28/2021, 10:07 AM  [employer] [phone]

## 2021-12-29 DIAGNOSIS — M3212 Pericarditis in systemic lupus erythematosus: Principal | ICD-10-CM

## 2021-12-29 DIAGNOSIS — N186 End stage renal disease: Secondary | ICD-10-CM | POA: Diagnosis not present

## 2021-12-29 DIAGNOSIS — J189 Pneumonia, unspecified organism: Secondary | ICD-10-CM | POA: Diagnosis not present

## 2021-12-29 DIAGNOSIS — I3139 Other pericardial effusion (noninflammatory): Secondary | ICD-10-CM | POA: Diagnosis not present

## 2021-12-29 LAB — RENAL FUNCTION PANEL
Albumin: 2.2 g/dL — ABNORMAL LOW (ref 3.5–5.0)
Anion gap: 11 (ref 5–15)
BUN: 33 mg/dL — ABNORMAL HIGH (ref 6–20)
CO2: 27 mmol/L (ref 22–32)
Calcium: 8.2 mg/dL — ABNORMAL LOW (ref 8.9–10.3)
Chloride: 91 mmol/L — ABNORMAL LOW (ref 98–111)
Creatinine, Ser: 7.22 mg/dL — ABNORMAL HIGH (ref 0.44–1.00)
GFR, Estimated: 7 mL/min — ABNORMAL LOW (ref >60)
Glucose, Bld: 100 mg/dL — ABNORMAL HIGH (ref 70–99)
Phosphorus: 5.9 mg/dL — ABNORMAL HIGH (ref 2.5–4.6)
Potassium: 5 mmol/L (ref 3.5–5.1)
Sodium: 129 mmol/L — ABNORMAL LOW (ref 135–145)

## 2021-12-29 LAB — CBC
HCT: 23.8 % — ABNORMAL LOW (ref 36.0–46.0)
Hemoglobin: 7.4 g/dL — ABNORMAL LOW (ref 12.0–15.0)
MCH: 26.7 pg (ref 26.0–34.0)
MCHC: 31.1 g/dL (ref 30.0–36.0)
MCV: 85.9 fL (ref 80.0–100.0)
Platelets: 317 10*3/uL (ref 150–400)
RBC: 2.77 MIL/uL — ABNORMAL LOW (ref 3.87–5.11)
RDW: 15.7 % — ABNORMAL HIGH (ref 11.5–15.5)
WBC: 8.1 10*3/uL (ref 4.0–10.5)
nRBC: 0 % (ref 0.0–0.2)

## 2021-12-29 LAB — PREPARE RBC (CROSSMATCH)

## 2021-12-29 LAB — BASIC METABOLIC PANEL
Anion gap: 13 (ref 5–15)
BUN: 28 mg/dL — ABNORMAL HIGH (ref 6–20)
CO2: 27 mmol/L (ref 22–32)
Calcium: 8.3 mg/dL — ABNORMAL LOW (ref 8.9–10.3)
Chloride: 90 mmol/L — ABNORMAL LOW (ref 98–111)
Creatinine, Ser: 6.58 mg/dL — ABNORMAL HIGH (ref 0.44–1.00)
GFR, Estimated: 8 mL/min — ABNORMAL LOW (ref >60)
Glucose, Bld: 101 mg/dL — ABNORMAL HIGH (ref 70–99)
Potassium: 5.1 mmol/L (ref 3.5–5.1)
Sodium: 130 mmol/L — ABNORMAL LOW (ref 135–145)

## 2021-12-29 LAB — C4 COMPLEMENT: Complement C4, Body Fluid: 27 mg/dL (ref 12–38)

## 2021-12-29 LAB — C3 COMPLEMENT: C3 Complement: 132 mg/dL (ref 82–167)

## 2021-12-29 MED ORDER — COLCHICINE 0.3 MG HALF TABLET
0.3000 mg | ORAL_TABLET | Freq: Every day | ORAL | Status: DC
  Administered 2021-12-29 – 2021-12-30 (×2): 0.3 mg via ORAL
  Filled 2021-12-29 (×2): qty 1

## 2021-12-29 MED ORDER — HYDROXYCHLOROQUINE SULFATE 200 MG PO TABS
200.0000 mg | ORAL_TABLET | Freq: Every day | ORAL | Status: DC
  Administered 2021-12-29 – 2021-12-30 (×2): 200 mg via ORAL
  Filled 2021-12-29 (×2): qty 1

## 2021-12-29 MED ORDER — SODIUM CHLORIDE 0.9% IV SOLUTION: Freq: Once | INTRAVENOUS | Status: AC

## 2021-12-29 MED ORDER — PREDNISONE 20 MG PO TABS
40.0000 mg | ORAL_TABLET | Freq: Every day | ORAL | Status: DC
  Administered 2021-12-29 – 2021-12-30 (×2): 40 mg via ORAL
  Filled 2021-12-29 (×2): qty 2

## 2021-12-29 MED ORDER — DARBEPOETIN ALFA 200 MCG/0.4ML IJ SOSY: 200.0000 ug | PREFILLED_SYRINGE | INTRAMUSCULAR | Status: DC

## 2021-12-29 MED ORDER — PREDNISONE 20 MG PO TABS: 20.0000 mg | ORAL_TABLET | Freq: Every day | ORAL | Status: DC

## 2021-12-29 MED ORDER — AMOXICILLIN-POT CLAVULANATE 500-125 MG PO TABS
1.0000 | ORAL_TABLET | Freq: Two times a day (BID) | ORAL | Status: DC
  Administered 2021-12-29 – 2021-12-30 (×3): 500 mg via ORAL
  Filled 2021-12-29 (×4): qty 1

## 2021-12-29 MED ORDER — OXYCODONE HCL 5 MG PO TABS
5.0000 mg | ORAL_TABLET | ORAL | Status: DC | PRN
  Administered 2021-12-29 – 2021-12-30 (×4): 5 mg via ORAL
  Filled 2021-12-29 (×4): qty 1

## 2021-12-29 MED ORDER — DIPHENHYDRAMINE HCL 25 MG PO CAPS
50.0000 mg | ORAL_CAPSULE | Freq: Once | ORAL | Status: AC
  Administered 2021-12-29: 50 mg via ORAL
  Filled 2021-12-29: qty 2

## 2021-12-29 NOTE — Progress Notes (Addendum)
[facility] Progress Note   Subjective: Seen in room. Still has room. Discussed with primary. Now treating for pericarditis. Itching, skin excoriated from scratching. No chest pain at present but receiving PRN dilaudid.   Objective Vitals:   12/28/21 1713 12/28/21 1812 12/28/21 1953 12/29/21 0556  BP: 114/77  113/80 (!) 120/99  Pulse: 70 68 72 75  Resp: 20  20 20   Temp: 98.3 F (36.8 C)  98.2 F (36.8 C) 98.7 F (37.1 C)  TempSrc: Oral  Oral Oral  SpO2: 98% 97% 99% 98%  Weight:       Physical Exam General: Chronically ill appearing female in NAD Skin: Excoriation marks on back from scratching. No visible rash or lesions.  Heart: S1,S2 + pericardial rub. SR HR 70s.  Lungs: CTAB, slightly decreased in bases. No WOB.  Abdomen: Slightly protuberant but no ascites, NABS Extremities: No LE edema Dialysis Access: RIJ TDC drsg intact.    Additional Objective Labs: Basic Metabolic Panel: Recent Labs  Lab 12/26/21 2049 12/28/21 0232 12/29/21 0141  NA 134* 131* 130*  K 4.3 5.4* 5.1  CL 91* 93* 90*  CO2 28 25 27   GLUCOSE 111* 107* 101*  BUN 29* 49* 28*  CREATININE 6.74* 9.48* 6.58*  CALCIUM 8.1* 8.1* 8.3*   Liver Function Tests: Recent Labs  Lab 12/27/21 0116 12/28/21 0232  AST 19 13*  ALT 19 15  ALKPHOS 318* 265*  BILITOT 0.6 0.3  PROT 6.9 6.0*  ALBUMIN 2.5* 2.2*   Recent Labs  Lab 12/27/21 0116  LIPASE 40   CBC: Recent Labs  Lab 12/26/21 2049 12/28/21 0232 12/28/21 2020 12/29/21 0141  WBC 10.4 9.0  --  8.1  NEUTROABS  --  7.6  --   --   HGB 7.5* 6.8* 7.2* 7.4*  HCT 25.0* 21.6* 23.1* 23.8*  MCV 87.7 86.1  --  85.9  PLT 442* 352  --  317   Blood Culture    Component Value Date/Time   SDES BLOOD LEFT FOREARM 12/27/2021 0023   SPECREQUEST  12/27/2021 0023    BOTTLES DRAWN AEROBIC AND ANAEROBIC Blood Culture results may not be optimal due to an excessive volume of blood received in culture bottles   CULT  12/27/2021 0023    NO  GROWTH 2 DAYS Performed at [facility], [place] [address] [place], [place] [address]    REPTSTATUS PENDING 12/27/2021 0023    Cardiac Enzymes: No results for input(s): CKTOTAL, CKMB, CKMBINDEX, TROPONINI in the last 168 hours. CBG: No results for input(s): GLUCAP in the last 168 hours. Iron Studies:  Recent Labs    12/27/21 0340  IRON 17*  TIBC 237*   @lablastinr3 @ Studies/Results: ECHOCARDIOGRAM LIMITED  Result Date: 12/27/2021    ECHOCARDIOGRAM LIMITED REPORT   Patient Name:   SOFIAH LYNE Date of Exam: 12/27/2021 Medical Rec #:  [id number]        Height:       67.0 in Accession #:    [id number]       Weight:       102.3 lb Date of Birth:  [birth year]       BSA:          1.521 m Patient Age:    37 years         BP:           175/118 mmHg Patient Gender: F                HR:  73 bpm. Exam Location:  Inpatient Procedure: Limited Echo, Limited Color Doppler and Cardiac Doppler Indications:    pericardial effusion  History:        Patient has prior history of Echocardiogram examinations, most                 recent 12/13/2021. End stage renal disease. Lupus.; Risk                 Factors:Hypertension.  Sonographer:    Johny Chess RDCS Referring Phys: [id number] [place]  1. Left ventricular ejection fraction, by estimation, is 25 to 30%. The left ventricle has severely decreased function. The left ventricle demonstrates global hypokinesis.  2. Right ventricular systolic function is moderately reduced. The right ventricular size is normal. There is normal pulmonary artery systolic pressure. The estimated right ventricular systolic pressure is 79.0 mmHg.  3. Moderate pericardial effusion. The pericardial effusion is circumferential.  4. The mitral valve is normal in structure. Mild mitral valve regurgitation. No evidence of mitral stenosis.  5. The aortic valve is normal in structure. Aortic valve regurgitation is trivial. No aortic stenosis is present.  6. The  inferior vena cava is normal in size with greater than 50% respiratory variability, suggesting right atrial pressure of 3 mmHg. FINDINGS  Left Ventricle: Left ventricular ejection fraction, by estimation, is 25 to 30%. The left ventricle has severely decreased function. The left ventricle demonstrates global hypokinesis. The left ventricular internal cavity size was normal in size. There is no left ventricular hypertrophy. Right Ventricle: The right ventricular size is normal. No increase in right ventricular wall thickness. Right ventricular systolic function is moderately reduced. There is normal pulmonary artery systolic pressure. The tricuspid regurgitant velocity is 2.74 m/s, and with an assumed right atrial pressure of 3 mmHg, the estimated right ventricular systolic pressure is 24.0 mmHg. Left Atrium: Left atrial size was normal in size. Right Atrium: Right atrial size was normal in size. Pericardium: A moderately sized pericardial effusion is present. The pericardial effusion is circumferential. Mitral Valve: The mitral valve is normal in structure. Mild mitral valve regurgitation. No evidence of mitral valve stenosis. Tricuspid Valve: The tricuspid valve is normal in structure. Tricuspid valve regurgitation is mild . No evidence of tricuspid stenosis. Aortic Valve: The aortic valve is normal in structure. Aortic valve regurgitation is trivial. No aortic stenosis is present. Pulmonic Valve: The pulmonic valve was normal in structure. Pulmonic valve regurgitation is trivial. No evidence of pulmonic stenosis. Aorta: The aortic root is normal in size and structure. Venous: The inferior vena cava is normal in size with greater than 50% respiratory variability, suggesting right atrial pressure of 3 mmHg. IAS/Shunts: No atrial level shunt detected by color flow Doppler. LEFT VENTRICLE PLAX 2D LVIDd:         4.90 cm Diastology LVIDs:         4.00 cm LV e' medial:  6.31 cm/s LV PW:         1.50 cm LV e' lateral:  5.55 cm/s LV IVS:        1.10 cm  IVC IVC diam: 1.50 cm  AORTA Ao Asc diam: 3.20 cm TRICUSPID VALVE TR Peak grad:   30.0 mmHg TR Vmax:        274.00 cm/s Fransico Him MD Electronically signed by Fransico Him MD Signature Date/Time: 12/27/2021/10:32:35 AM    Final    Medications:   (feeding supplement) PROSource Plus  30 mL Oral TID BM   sodium  chloride   Intravenous Once   acetaminophen  650 mg Oral Once   amLODipine  10 mg Oral Daily   amoxicillin-clavulanate  1 tablet Oral BID   calcium acetate  1,334 mg Oral TID WC   carvedilol  12.5 mg Oral BID WC   Chlorhexidine Gluconate Cloth  6 each Topical Q0600   cloNIDine  0.3 mg Oral Q8H   colchicine  0.3 mg Oral Daily   darbepoetin (ARANESP) injection - DIALYSIS  200 mcg Intravenous Q Sat-HD   diphenhydrAMINE  50 mg Oral Once   doxercalciferol  5 mcg Intravenous Q T,Th,Sa-HD   heparin  5,000 Units Subcutaneous Q8H   hydroxychloroquine  200 mg Oral Daily   multivitamin  1 tablet Oral QHS   pantoprazole  40 mg Oral Daily   predniSONE  40 mg Oral Q breakfast   torsemide  20 mg Oral TID     Dialysis Orders: Center: [facility] 4h   2/2 bath 400/800 56kg  Hep none   R TDC  - hect 5 ug  IV tiw  - mircera 200 mcg IV q 2 weeks (last 11/21/2022)  - venofer 100 mg IV X 5 doses (1/5 given 12/26/2021)   Assessment/Plan:  RML PNA: BC NGTD, ABX per primary  HFrEF/ Possible volume overload: EF 25-30% 12/27/2021. Elevated troponin, flat trend. Left 4.8 kg above OP EDW. Net UF  3.0 liters. No post wt. Daily wt ordered. RN getting wt this AM. Wt today 52 kg. Standing Wt. Very much under OP EDW. Lower EDW on discharge.  Pericardial Effusion/Pericarditis: Chronic issue but increased this admission. No evidence of tamponade. EKG doesn't appear compatible with pericarditis however clinical presentation is very suspicious. Colchicine ordered. Dose carefully in ESRD and limit use. Per primary.   ESRD -  T,Th,S HD 12/28/2021 on schedule. 2.0 K bath.    Hypertension/volume  - BP elevated in ED, now appears controlled. Continue home meds. No evidence of volume overload by exam, small bilateral pleural effusions on Xray.   Anemia  - HGB 6.8 S/P 1 unit PRBCs 12/28/2021. HGB 7.4 today. Will give additional unit of PRBCs today. Watch for volume overload. No recent OP ESA. Was supposed to receive Aranesp 200 mcg IV with HD yesterday but dose not given. Will reschedule for Tuesday.  Had started OP Fe load, Tsat 7 here. Hold Fe load until ABX course completed.   Metabolic bone disease -  continue binders, VDRA. No recent PO4. Added labs today.   Nutrition - Low albumin. Start protein supps. RENAL DIET with fluid restrictions. Lupus-primary starting plaquenil, prednisone today. Consulting Rheumatology.   Rita H. Brown NP-C 12/29/2021, 9:41 AM  [facility] [phone]  Pt seen, examined and agree w A/P as above.  [name]  MD 12/29/2021, 6:17 PM

## 2021-12-29 NOTE — TOC Initial Note (Signed)
Transition of Care [facility]) - Initial/Assessment Note    Patient Details  Name: Cathy [name] MRN: [id number] Date of Birth: 12/08/1980  Transition of Care [facility]) CM/SW Contact:    Konrad Penta, RN Phone Number: 12/29/2021, 11:45 AM  Clinical Narrative:   Spoke with patient about TOC needs. States she is not going home after all today due to a number of different medical concerns.   [name] reports she lives with her best friend and 3 kids. States she will require transportation assistance when she goes home. Also requests that medications be filled thru [facility] prior to discharge.            Expected Discharge Plan: Home/Self Care Barriers to Discharge: Continued Medical Work up   Patient Goals and CMS Choice Patient states their goals for this hospitalization and ongoing recovery are:: return home      Expected Discharge Plan and Services Expected Discharge Plan: Home/Self Care       Living arrangements for the past 2 months: Apartment                                    Prior Living Arrangements/Services Living arrangements for the past 2 months: Apartment Lives with:: Minor Children, Friends   Do you feel safe going back to the place where you live?: Yes               Activities of Daily Living   ADL Screening (condition at time of admission) Does the patient have difficulty seeing, even when wearing glasses/contacts?: No Does the patient have difficulty concentrating, remembering, or making decisions?: No Patient able to express need for assistance with ADLs?: Yes Does the patient have difficulty dressing or bathing?: No Independently performs ADLs?: Yes (appropriate for developmental age) Does the patient have difficulty walking or climbing stairs?: Yes Weakness of Legs: Both Weakness of Arms/Hands: None  Permission Sought/Granted                  Emotional Assessment              Admission diagnosis:  Chest pain  [R07.9] Patient Active Problem List   Diagnosis Date Noted   Anemia of chronic kidney failure 12/28/2021   Chronic combined systolic and diastolic congestive heart failure ([place]) 12/27/2021   Chest pain 12/27/2021   Acute on chronic respiratory failure with hypoxia ([place]) 12/15/2021   Pneumonia of right middle lobe due to infectious organism 12/15/2021   Sepsis ([place]) 12/13/2021   MSSA (methicillin susceptible Staphylococcus aureus) septicemia ([place]) 06/08/2021   Benign essential HTN 06/08/2021   Pleural effusion 06/08/2021   Anasarca associated with disorder of kidney 06/08/2021   HFrEF (heart failure with reduced ejection fraction) ([place])    Encounter for antineoplastic chemotherapy 01/07/2021   Respiratory failure ([place]) 12/24/2020   Flash pulmonary edema ([place]) 12/23/2020   Hypertensive emergency 12/23/2020   CHF exacerbation ([place]) 12/23/2020   ESRD on hemodialysis ([place]) 12/23/2020   COVID-19 virus infection 12/23/2020   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   Volume overload 11/29/2020   Hyperkalemia 11/29/2020   Pericardial effusion    Aortic atherosclerosis ([place]) 11/07/2020   Anasarca 11/07/2020   Abdominal pain 11/07/2020   Acute diarrhea 11/07/2020   Hydrosalpinx (bilateral) 02/07/2019   Pelvic adhesive disease 02/03/2019   Tubal ligation evaluation 02/03/2019   Renal hypertension 02/02/2019   Pulmonary edema 02/01/2019   AMA (advanced maternal age)  multigravida 35+ 01/12/2019   Non compliance w medication regimen 01/12/2019   Current chronic use of systemic steroids 01/12/2019   Unwanted fertility 12/10/2018   Anemia 10/12/2018   Supervision of high risk pregnancy, antepartum 10/04/2018   Systemic lupus erythematosus (SLE) with pericarditis ([place]) 09/20/2018   Supervision of pregnancy with grand multiparity in second trimester 09/20/2018   Substance abuse ([place]) 09/20/2018   Acute renal failure (ARF) ([place]) 09/20/2018   MDD (major depressive disorder) 10/11/2015   Asthma  09/22/2011   PCP:  Pcp, No Pharmacy:   [id number] [address], [place] [address] Phone: [phone] Fax: [phone]     Social Determinants of Health (SDOH) Interventions    Readmission Risk Interventions No flowsheet data found.

## 2021-12-29 NOTE — Progress Notes (Signed)
Progress Note   Patient: Cathy [name] KCL:[id number] DOB: Apr 04, 1981 DOA: 12/26/2021     2 DOS: the patient was seen and examined on 12/29/2021   Brief hospital course: Cathy [name] was admitted to the hospital with the working diagnosis of right middle lobe pneumonia.   41 yo female with the past medical history of systemic lupus erythematosus, ESRD, diastolic heart failure, HTN, chronic respiratory failure, chronic anemia and bipolar disorder who presented with dyspnea and chest pain.  Recent hospitalization 01/13 to 01/17 2023 for right lowe lobe pneumonia, that required non invasive mechanical ventilation and chest tube placement. Reported persistent chest pain since her discharge from the hospital, associated with dyspnea and productive cough. On her initial physical examination her blood pressure was 151/106, HR 85, RR 38 and oxygen saturation 99%,   Na 134. K 4.3, Cl 91, bicarb 28, glucose 111, bun 29 and cr 6,74 BNP >4,500 High sensitive troponin 148 and 149  Wbc 10.4, hgb 7,4, hct 25 and plt 442  SARS COVID 19 negative   Chest radiograph with cardiomegaly, small bilateral pleural effusions and bibasilar atelectasis, HD cathter in place right IJ,   EKG 89 bpm, normal axis, normal intervals, sinus rhythm, with poor R wave progression, no significant ST segment or T wave changes.   Patient was placed on antibiotic therapy and underwent renal replacement therapy Follow up echocardiogram with persistent pericardial effusion.   Elevated ESR and persistent chest pain requiring IV hydromorphone for pain control.  Suspected lupus activation with pericarditis, patient has been started on prednisone and colchicine. Hydroxychloroquine.     Assessment and Plan * Pneumonia of right middle lobe due to infectious organism- (present on admission) CT with right middle lobe opacity with air bronchogram, small effusion, but improved compared to prior CT chest.   Old records reviewed  recent hospitalization she was treated for right lower lobe pneumonia, para pneumonic effusion requiring chest tube.  Apparently received antibiotic therapy from 01/13 to 01/17 with cefepime.  Patient is feeling better, her oxygenation is 98% on room air.   Continue with Augmentin as antibiotic therapy for complicated community acquired pneumonia.  She had a para pneumonic effusion that was treated with chest tube drainage.   Incentive spirometer, and out of bed to chair tid with meals.  ESRD on hemodialysis (HCC) Hyponatremia and hyperkalemia.  Patient tolerated renal replacement therapy well yesterday.   Today K is 5,1 and serum bicarbonate is 27.   Metabolic bone disease continue with phsolo and doxercalciferol.  Diuresis with torsemide for diuresis tid   Pericardial effusion- (present on admission) Echocardiogram with moderate pericardial effusion, no clinical signs of tamponade. Patient is hemodynamically stable, will need close follow up.   Systemic lupus erythematosus (SLE) with pericarditis ([place])- (present on admission) Patient has been off treatment and no follow up as outpatient. ESR is elevated and she continue to have chest and upper abdominal pain, requiring frequent doses of IV hydromorphone. Positive itchy skin in face, arms and back, similar symptoms to initial lupus flare.  Concern for lupus reactivation with pericarditis. Start patient with hydroxychloroquine  Will add oral prednisone and colchicine, continue inflammatory markers and will need close follow up with rheumatology .    Hypertensive emergency- (present on admission) On amlodipine, clonidine and carvedilol for blood pressure control.     Pleuritic chest pain-resolved as of 12/28/2021, (present on admission) Suspected pleuritic chest pain from right middle lobe infiltrate.    Anemia of chronic kidney failure Iron deficiency.  Iron  stores with serum iron of 17, tibc 237, transferrin saturation 7,  consistent with anemia of iron deficiency combined with anemia of chronic renal disease.   SP 1 units PRBC transfusion    Follow up hgb is 7,4 and hct is 23,8 Plan to continue with EPO and iron supplementation   Chronic combined systolic and diastolic congestive heart failure ([place])- (present on admission) No clinical signs of heart failure exacerbation, echocardiogram with decreased LV EF 25% with global hypokinesis, with positive circumferential pericardial effusion with no tamponade.  Medical management with carvedilol, continue ultrafiltration for volume management.   Elevated troponin-resolved as of 12/27/2021, (present on admission) Please see assessment and plan above     Subjective: Patient is feeling better, but continue to have chest pain and upper abdominal pain, no nausea or vomiting.   Objective BP (!) 120/99 (BP Location: Right Arm)    Pulse 75    Temp 98.7 F (37.1 C) (Oral)    Resp 20    Wt 54.6 kg    SpO2 98%    BMI 18.85 kg/m   Neurology awake and alert  ENT no pallor  Cardiovascular S1 and S2 present, positive systolic murmur at the apex, positive rub.  Pulmonary no rales or rhonchi with no wheezing Abdominal soft and on tender Skin positive dry skin with hyperpigmentation face and arms.,  Musculoskeletal   Data Reviewed:    Family Communication: no family at the bedside   Disposition: Status is: Inpatient  Remains inpatient appropriate because: possible pericarditis.            Author: Tawni Millers, MD 12/29/2021 9:15 AM  For on call review [email or web address].

## 2021-12-30 ENCOUNTER — Other Ambulatory Visit (HOSPITAL_COMMUNITY): Payer: Self-pay

## 2021-12-30 ENCOUNTER — Encounter: Payer: Self-pay | Admitting: Hematology and Oncology

## 2021-12-30 DIAGNOSIS — M3212 Pericarditis in systemic lupus erythematosus: Secondary | ICD-10-CM | POA: Diagnosis not present

## 2021-12-30 DIAGNOSIS — N186 End stage renal disease: Secondary | ICD-10-CM | POA: Diagnosis not present

## 2021-12-30 DIAGNOSIS — I3139 Other pericardial effusion (noninflammatory): Secondary | ICD-10-CM | POA: Diagnosis not present

## 2021-12-30 DIAGNOSIS — J189 Pneumonia, unspecified organism: Secondary | ICD-10-CM | POA: Diagnosis not present

## 2021-12-30 LAB — TYPE AND SCREEN
ABO/RH(D): O POS
Antibody Screen: NEGATIVE
DAT, IgG: NEGATIVE
Donor AG Type: NEGATIVE
Donor AG Type: NEGATIVE
Unit division: 0
Unit division: 0

## 2021-12-30 LAB — SEDIMENTATION RATE: Sed Rate: 134 mm/hr — ABNORMAL HIGH (ref 0–22)

## 2021-12-30 LAB — C-REACTIVE PROTEIN: CRP: 13.1 mg/dL — ABNORMAL HIGH (ref <1.0)

## 2021-12-30 LAB — CBC
HCT: 27.2 % — ABNORMAL LOW (ref 36.0–46.0)
Hemoglobin: 8.9 g/dL — ABNORMAL LOW (ref 12.0–15.0)
MCH: 27.6 pg (ref 26.0–34.0)
MCHC: 32.7 g/dL (ref 30.0–36.0)
MCV: 84.2 fL (ref 80.0–100.0)
Platelets: 285 10*3/uL (ref 150–400)
RBC: 3.23 MIL/uL — ABNORMAL LOW (ref 3.87–5.11)
RDW: 15.1 % (ref 11.5–15.5)
WBC: 9.5 10*3/uL (ref 4.0–10.5)
nRBC: 0 % (ref 0.0–0.2)

## 2021-12-30 LAB — BPAM RBC
Blood Product Expiration Date: 202302272359
Blood Product Expiration Date: 202303012359
ISSUE DATE / TIME: 202301280626
ISSUE DATE / TIME: 202301291045
Unit Type and Rh: 5100
Unit Type and Rh: 5100

## 2021-12-30 LAB — BASIC METABOLIC PANEL
Anion gap: 15 (ref 5–15)
BUN: 54 mg/dL — ABNORMAL HIGH (ref 6–20)
CO2: 23 mmol/L (ref 22–32)
Calcium: 8.5 mg/dL — ABNORMAL LOW (ref 8.9–10.3)
Chloride: 91 mmol/L — ABNORMAL LOW (ref 98–111)
Creatinine, Ser: 8.56 mg/dL — ABNORMAL HIGH (ref 0.44–1.00)
GFR, Estimated: 6 mL/min — ABNORMAL LOW (ref >60)
Glucose, Bld: 169 mg/dL — ABNORMAL HIGH (ref 70–99)
Potassium: 5.1 mmol/L (ref 3.5–5.1)
Sodium: 129 mmol/L — ABNORMAL LOW (ref 135–145)

## 2021-12-30 MED ORDER — COLCHICINE 0.6 MG PO TABS
0.3000 mg | ORAL_TABLET | Freq: Every day | ORAL | 0 refills | Status: DC
  Filled 2021-12-30: qty 30, 60d supply, fill #0

## 2021-12-30 MED ORDER — AMOXICILLIN-POT CLAVULANATE 500-125 MG PO TABS
1.0000 | ORAL_TABLET | Freq: Two times a day (BID) | ORAL | 0 refills | Status: AC
  Filled 2021-12-30: qty 10, 5d supply, fill #0

## 2021-12-30 MED ORDER — HYDROXYCHLOROQUINE SULFATE 200 MG PO TABS: 100.0000 mg | ORAL_TABLET | ORAL | Status: DC

## 2021-12-30 MED ORDER — PREDNISONE 20 MG PO TABS
40.0000 mg | ORAL_TABLET | Freq: Every day | ORAL | 0 refills | Status: DC
  Filled 2021-12-30: qty 60, 30d supply, fill #0

## 2021-12-30 MED ORDER — OXYCODONE HCL 5 MG PO TABS
5.0000 mg | ORAL_TABLET | Freq: Four times a day (QID) | ORAL | 0 refills | Status: DC | PRN
  Filled 2021-12-30: qty 10, 3d supply, fill #0

## 2021-12-30 MED ORDER — HYDROXYCHLOROQUINE SULFATE 200 MG PO TABS
100.0000 mg | ORAL_TABLET | ORAL | 0 refills | Status: DC
  Filled 2021-12-30: qty 30, 60d supply, fill #0

## 2021-12-30 NOTE — TOC Transition Note (Signed)
Transition of Care [facility]) - CM/SW Discharge Note   Patient Details  Name: Cathy [name] MRN: [id number] Date of Birth: [birth year]  Transition of Care [facility]) CM/SW Contact:  Milas Gain, [place] Phone Number: 12/30/2021, 12:23 PM   Clinical Narrative:     Patient will DC to: [place] [address]  Anticipated DC date: 12/30/2021  Family notified: Patient declined  Transport by: [facility]    CSW signing off.   Final next level of care: Home/Self Care Barriers to Discharge: Continued Medical Work up   Patient Goals and CMS Choice Patient states their goals for this hospitalization and ongoing recovery are:: return hom      Discharge Placement                       Discharge Plan and Services                                     Social Determinants of Health (SDOH) Interventions     Readmission Risk Interventions No flowsheet data found.

## 2021-12-30 NOTE — Progress Notes (Signed)
[facility] Progress Note   Subjective:   Excited to go home to see her kids. Denies SOB, CP, palpitations, dizziness, abdominal pain and nausea.   Objective Vitals:   12/30/21 0500 12/30/21 0608 12/30/21 0612 12/30/21 0735  BP:   (!) 146/104 (!) 147/112  Pulse:  62  67  Resp:  20  20  Temp:  97.9 F (36.6 C)  97.9 F (36.6 C)  TempSrc:  Oral  Oral  SpO2:    98%  Weight: 52 kg      Physical Exam General: Well developed female, alert and in NAD Heart: RRR, + pericardial rub. No murmur Lungs: CTA bilaterally without wheezing, rhonchi or rales Abdomen: Soft, non-distended ,+BS Extremities: No edema b/l lower extremities Dialysis Access:  R IJ [facility] dressing intact  Additional Objective Labs: Basic Metabolic Panel: Recent Labs  Lab 12/29/21 0141 12/29/21 1004 12/30/21 0313  NA 130* 129* 129*  K 5.1 5.0 5.1  CL 90* 91* 91*  CO2 27 27 23   GLUCOSE 101* 100* 169*  BUN 28* 33* 54*  CREATININE 6.58* 7.22* 8.56*  CALCIUM 8.3* 8.2* 8.5*  PHOS  --  5.9*  --    Liver Function Tests: Recent Labs  Lab 12/27/21 0116 12/28/21 0232 12/29/21 1004  AST 19 13*  --   ALT 19 15  --   ALKPHOS 318* 265*  --   BILITOT 0.6 0.3  --   PROT 6.9 6.0*  --   ALBUMIN 2.5* 2.2* 2.2*   Recent Labs  Lab 12/27/21 0116  LIPASE 40   CBC: Recent Labs  Lab 12/26/21 2049 12/28/21 0232 12/28/21 2020 12/29/21 0141 12/30/21 0313  WBC 10.4 9.0  --  8.1 9.5  NEUTROABS  --  7.6  --   --   --   HGB 7.5* 6.8* 7.2* 7.4* 8.9*  HCT 25.0* 21.6* 23.1* 23.8* 27.2*  MCV 87.7 86.1  --  85.9 84.2  PLT 442* 352  --  317 285   Blood Culture    Component Value Date/Time   SDES BLOOD LEFT FOREARM 12/27/2021 0023   SPECREQUEST  12/27/2021 0023    BOTTLES DRAWN AEROBIC AND ANAEROBIC Blood Culture results may not be optimal due to an excessive volume of blood received in culture bottles   CULT  12/27/2021 0023    NO GROWTH 2 DAYS Performed at [facility], [place] [address],  [place], [place] [address]    REPTSTATUS PENDING 12/27/2021 0023    Cardiac Enzymes: No results for input(s): CKTOTAL, CKMB, CKMBINDEX, TROPONINI in the last 168 hours. CBG: No results for input(s): GLUCAP in the last 168 hours. Iron Studies: No results for input(s): IRON, TIBC, TRANSFERRIN, FERRITIN in the last 72 hours. @lablastinr3 @ Studies/Results: No results found. Medications:   (feeding supplement) PROSource Plus  30 mL Oral TID BM   sodium chloride   Intravenous Once   acetaminophen  650 mg Oral Once   amLODipine  10 mg Oral Daily   amoxicillin-clavulanate  1 tablet Oral BID   calcium acetate  1,334 mg Oral TID WC   carvedilol  12.5 mg Oral BID WC   Chlorhexidine Gluconate Cloth  6 each Topical Q0600   cloNIDine  0.3 mg Oral Q8H   colchicine  0.3 mg Oral Daily   [START ON 12/31/2021] darbepoetin (ARANESP) injection - DIALYSIS  200 mcg Intravenous Q Tue-HD   doxercalciferol  5 mcg Intravenous Q T,Th,Sa-HD   heparin  5,000 Units Subcutaneous Q8H   [  START ON 12/31/2021] hydroxychloroquine  100 mg Oral Q48H   multivitamin  1 tablet Oral QHS   pantoprazole  40 mg Oral Daily   predniSONE  40 mg Oral Q breakfast   torsemide  20 mg Oral TID    Dialysis Orders: [facility] 4h   2/2 bath 400/800 56kg  Hep none   R TDC  - hect 5 ug  IV tiw  - mircera 200 mcg IV q 2 weeks (last 11/21/2022)  - venofer 100 mg IV X 5 doses (1/5 given 12/26/2021)    Assessment/Plan: RML PNA: BC NGTD, ABX per primary  HFrEF/ Possible volume overload: EF 25-30% 12/27/2021. Elevated troponin, flat trend. Under outpatient EDW by standing weights, will lower at discharge.  Pericardial Effusion/Pericarditis: Chronic issue but increased this admission. No evidence of tamponade. EKG doesn't appear compatible with pericarditis however clinical presentation is very suspicious. Colchicine ordered. Dose carefully in ESRD and limit use. Per primary.   ESRD -  T,Th,S HD 12/28/2021 on schedule. 2.0 K bath.    Hypertension/volume  - BP elevated in ED, now appears controlled. Continue home meds. No evidence of volume overload by exam, small bilateral pleural effusions on Xray.   Anemia  - HGB 6.8 on admission, S/P 2 units PRBCs this admission. Hgb now 8.9. Was supposed to receive Aranesp 200 mcg IV with last HD but dose not given. Will reschedule for Tuesday.  Had started OP Fe load, Tsat 7 here. Hold Fe load until ABX course completed.   Metabolic bone disease -  continue binders, VDRA. Phos 5.9, resume binders at discharge.   Nutrition - Low albumin. Start protein supps. RENAL DIET with fluid restrictions. Lupus-primary starting plaquenil, prednisone.  Rheumatology follow up outpatient.    Anice Paganini, PA-C 12/30/2021, 11:05 AM  [facility] Pager: [phone]

## 2021-12-30 NOTE — Discharge Summary (Signed)
Physician Discharge Summary  Cathy [name]:[id number] DOB: 1980-12-16 DOA: 12/26/2021  PCP: Pcp, No  Admit date: 12/26/2021 Discharge date: 12/30/2021  Admitted From: home  Disposition:  Home   Recommendations for Outpatient Follow-up and new medication changes:  Follow up with Primary Care in 7 to 10 days.  Patient has been placed on hydroxychloroquine for lupus On cholchicine (continue for 3 months) and prednisone (taper over 2 to 3 weeks after resolution of chest pain) for pericarditis Will need outpatient follow up with Rheumatology I spoke with Dr Benjamine Mola from Rheumatology who will see her in the office as follow up, I have talked to Cathy [name] and explained that is very important for follow up.    Home Health: no   Equipment/Devices: no    Discharge Condition: stable  CODE STATUS: full  Diet recommendation:  heart healthy and renal prudent.   Brief/Interim Summary: Cathy [name] was admitted to the hospital with the working diagnosis of right middle lobe pneumonia.  Acute pericarditis and suspected lupus flare.    41 yo female with the past medical history of systemic lupus erythematosus, ESRD, diastolic heart failure, HTN, chronic respiratory failure, chronic anemia and bipolar disorder who presented with dyspnea and chest pain.  Recent hospitalization 01/13 to 01/17 2023 for right lower lobe pneumonia, that required non invasive mechanical ventilation and chest tube placement. Reported persistent chest pain since her discharge from the hospital, associated with dyspnea and productive cough.  Her pain was mainly pleuritic, precordial, worse with supine position or bending forward. On her initial physical examination her blood pressure was 151/106, HR 85, RR 38 and oxygen saturation 99%,    Na 134. K 4.3, Cl 91, bicarb 28, glucose 111, bun 29 and cr 6,74 BNP >4,500 High sensitive troponin 148 and 149  Wbc 10.4, hgb 7,4, hct 25 and plt 442  SARS COVID 19 negative    Chest  radiograph with cardiomegaly, small bilateral pleural effusions and bibasilar atelectasis, HD cathter in place right IJ,    EKG 89 bpm, normal axis, normal intervals, sinus rhythm, with poor R wave progression, no significant ST segment or T wave changes.    Patient was placed on antibiotic therapy and underwent renal replacement therapy Follow up echocardiogram with persistent pericardial effusion but no tamponade.   Elevated ESR and persistent chest pain requiring IV hydromorphone for pain control.   Suspected lupus activation with pericarditis, patient has been started on prednisone and colchicine. Hydroxychloroquine.   Patient with improvement of her symptoms, plan to have a close follow up as outpatient.   Pneumonia of right middle lobe due to infectious organism- (present on admission) Patient has paced back on antibiotic therapy with good toleration, no dyspnea and no leukocytosis.   Old records reviewed recent hospitalization she was treated for right lower lobe pneumonia, para pneumonic effusion requiring chest tube.  Apparently received antibiotic therapy from 01/13 to 01/17 with cefepime. Further workup with CT with right middle lobe opacity with air bronchogram, small effusion, but improved compared to prior CT chest..    Plan to continue with Augmentin as antibiotic therapy for complicated community acquired pneumonia.  She had a para pneumonic effusion that was treated with chest tube drainage.    2. ESRD on hemodialysis (HCC) Hyponatremia and hyperkalemia.  Patient underwent HD during this hospitalization with no major complications At her discharge she is euvolemic Plan to continue with routine HD as outpatient   3. Pericardial effusion- (present on admission) Echocardiogram with moderate pericardial  effusion, no clinical signs of tamponade. Patient is hemodynamically stable, will need close follow up with outpatient echocardiogram.    4. Systemic lupus erythematosus  (SLE) with pericarditis ([place])- (present on admission) ESR is elevated and she had positive itchy skin in face, arms and back, similar symptoms to initial lupus flare. Positive pericardial rub and persistent pericardial effusion.    Because concern for lupus reactivation with pericarditis, patient has been started on cholchicine and prednisone. Recommendations to taper steroids over 2 to 4 weeks after his chest pain resolves and continue colchicine for 3 months.  2020 dismissed from Rheumatology because no show.  Old records were personally reviewed she was stopped hydroxychlorquine due to blurry vision on 2021 by dermatology.  05/2021 patient had ED visit with suspected SLE flare and was placed on prednisone apparently plan for referral to rheumatology with [facility]. (Case discussed with Rheumatology from [facility]).   I spoke with Dr. Benjamine Mola from Rheumatology who has agreed to see her as outpatient in the office.   5. Hypertensive emergency- (present on admission) Continue blood pressure control with amlodipine, clonidine and carvedilol for blood pressure control.    6. Anemia of chronic kidney failure Iron deficiency.  Iron stores with serum iron of 17, tibc 237, transferrin saturation 7, consistent with anemia of iron deficiency combined with anemia of chronic renal disease.   Patient received 2 units PRBC with improvement in Hgb and Hct.    Continue with EPO and iron supplementation on HD per nephrology recommendations.    7. Chronic combined systolic and diastolic congestive heart failure ([place])- (present on admission) No clinical signs of heart failure exacerbation, echocardiogram with decreased LV EF 25% with global hypokinesis, with positive circumferential pericardial effusion with no tamponade.   Continue blood pressure control with carvedilol, and volume management per ultrafiltration on hemodialysis   Discharge Diagnoses:  Principal Problem:   Pneumonia of right middle lobe due to  infectious organism Active Problems:   ESRD on hemodialysis (HCC)   Pericardial effusion   Systemic lupus erythematosus (SLE) with pericarditis ([place])   Hypertensive emergency   Chronic combined systolic and diastolic congestive heart failure ([place])   Anemia of chronic kidney failure    Discharge Instructions     Follow-up [place] Follow up.   Why: PLEASE CALL TO SCHEDULE APPT TO ESTABLISH PCP Contact information: [place] [id number] [phone]               No Known Allergies  Consultations: Nephrology    Procedures/Studies: DG Chest 2 View  Result Date: 12/26/2021 CLINICAL DATA:  Chest pain. EXAM: CHEST - 2 VIEW COMPARISON:  Chest radiograph dated 12/17/2021. FINDINGS: Right-sided dialysis catheter in similar position. There is cardiomegaly. Small bilateral pleural effusions, left greater right, with bibasilar atelectasis or infiltrate. No pneumothorax. No acute osseous pathology. IMPRESSION: 1. Small bilateral pleural effusions with bibasilar atelectasis or infiltrate. Overall similar to prior radiograph. 2. Cardiomegaly. Electronically Signed   By: Anner Crete M.D.   On: 12/26/2021 21:32   CT Angio Chest PE W and/or Wo Contrast  Result Date: 12/13/2021 CLINICAL DATA:  Shortness of breath, chest pain, epigastric pain EXAM: CT ANGIOGRAPHY CHEST WITH CONTRAST TECHNIQUE: Multidetector CT imaging of the chest was performed using the standard protocol during bolus administration of intravenous contrast. Multiplanar CT image reconstructions and MIPs were obtained to evaluate the vascular anatomy. RADIATION DOSE REDUCTION: This exam was performed according to  the departmental dose-optimization program which includes automated exposure control, adjustment of the mA and/or kV according to patient size and/or use of iterative reconstruction technique. CONTRAST:  176m OMNIPAQUE  IOHEXOL 350 MG/ML SOLN COMPARISON:  02/01/2019 FINDINGS: Cardiovascular: Heart is enlarged in size. There is moderate to large pericardial effusion. There is no contrast enhancement in thoracic aorta limiting evaluation of the lumen of thoracic aorta. There are no intraluminal filling defects in central pulmonary artery branches. Evaluation of small subsegmental peripheral branches in the lower lung fields is limited by infiltrates. There is ectasia of main pulmonary artery measuring 4 cm. There is no evidence of right ventricular strain. Mediastinum/Nodes: No significant lymphadenopathy seen. Lungs/Pleura: There is moderate right pleural effusion. There is small left pleural effusion. Large patchy alveolar infiltrate is seen in right lower lobe. There are smaller patchy infiltrates in right middle lobe and left lower lobe. Upper Abdomen: Unremarkable. Musculoskeletal: Unremarkable. Review of the MIP images confirms the above findings. IMPRESSION: There is no evidence of central pulmonary artery embolism. Evaluation of small peripheral subsegmental branches in the lower lobes is limited by infiltrates. There is ectasia of main pulmonary artery suggesting pulmonary arterial hypertension. There is large patchy alveolar infiltrate in the right lower lobe. Small patchy alveolar infiltrates are seen in the right middle lobe and left lower lobe. Findings suggest possible multifocal pneumonia. Moderate right pleural effusion. Small left pleural effusion. Moderate to large pericardial effusion. Electronically Signed   By: PElmer PickerM.D.   On: 12/13/2021 13:44   CT ABDOMEN PELVIS W CONTRAST  Result Date: 12/13/2021 CLINICAL DATA:  Abdominal pain.  Patient unable to lie flat EXAM: CT ABDOMEN AND PELVIS WITH CONTRAST TECHNIQUE: Multidetector CT imaging of the abdomen and pelvis was performed using the standard protocol following bolus administration of intravenous contrast. RADIATION DOSE REDUCTION: This exam was  performed according to the departmental dose-optimization program which includes automated exposure control, adjustment of the mA and/or kV according to patient size and/or use of iterative reconstruction technique. CONTRAST:  107mOMNIPAQUE IOHEXOL 350 MG/ML SOLN COMPARISON:  Chest CT same day FINDINGS: Lower chest: RIGHT lobe pneumonia. Large RIGHT effusion with passive atelectasis in the RIGHT lower lobe. Cardiomegaly Hepatobiliary: No enhancing hepatic lesion. Portal veins are patent. There is periportal edema. Small fluid around the liver. Gallbladder normal. Pancreas: small cyst in the pancreas measures 6 mm (image 47/3). No worrisome characteristics. No communication with the pancreatic duct. No pancreatic duct dilatation. Spleen: Normal spleen Adrenals/urinary tract: Adrenal glands normal. Four enhancement of the renal parenchyma. No hydronephrosis. Ureters and bladder grossly normal. Bladder collapsed Stomach/Bowel: Stomach, small bowel normal. Colon rectosigmoid colon normal. Vascular/Lymphatic: Abdominal aorta is normal caliber. There is no retroperitoneal or periportal lymphadenopathy. No pelvic lymphadenopathy. Reproductive: Uterus and adnexa unremarkable. Other: Normal moderate volume of intraperitoneal free fluid there is prominent pelvis. Mild anasarca soft tissues Musculoskeletal: No aggressive osseous lesion. IMPRESSION: 1. RIGHT lower lobe pneumonia. 2. Moderate volume intraperitoneal free fluid, anasarca and pleural effusions suggest volume overload/congestive heart failure. 3. Poor renal enhancement consistent with renal insufficiency. 4. Small benign appearing cyst in the pancreas. Per consensus recommendation, consider follow-up MRI in 1 year. This recommendation follows ACR consensus guidelines: Management of Incidental Pancreatic Cysts: A White Paper of the ACR Incidental Findings Committee. J [id number];[id number] Signed   By: StSuzy Bouchard.D.   On: 12/13/2021  13:51   DG Chest Port 1 View  Result Date: 12/17/2021 CLINICAL DATA:  Acute respiratory failure. EXAM: PORTABLE CHEST  1 VIEW COMPARISON:  12/16/2021 FINDINGS: There is a right-sided dialysis catheter with tip in the cavoatrial junction. The right-sided chest tube has been removed. No pneumothorax visualized. Unchanged enlargement of the cardiac silhouette reflecting underlying pericardial effusion. Bilateral pleural effusions are again noted and appear unchanged. Right lower lobe airspace disease is unchanged. Atelectasis within the left base is also stable. IMPRESSION: 1. No change in bilateral pleural effusions with lower lobe airspace consolidation and atelectasis. 2. Stable enlargement of the cardiac silhouette reflecting underlying pericardial effusion. 3. Interval removal of right chest tube without visible pneumothorax. Electronically Signed   By: Kerby Moors M.D.   On: 12/17/2021 05:58   DG CHEST PORT 1 VIEW  Result Date: 12/16/2021 CLINICAL DATA:  Chest tube, pleural effusion EXAM: PORTABLE CHEST 1 VIEW COMPARISON:  12/15/2021 FINDINGS: No significant change in AP portable chest radiograph. Pigtail chest tube remains about the right lung base with associated small effusion and atelectasis or consolidation. No significant pneumothorax. Probable small left pleural effusion and or retrocardiac atelectasis. Cardiomegaly. Large-bore right neck multi lumen vascular catheter. IMPRESSION: 1. No significant change in AP portable chest radiograph. Pigtail chest tube remains about the right lung base with associated small effusion and atelectasis or consolidation. No significant pneumothorax. 2. Probable small left pleural effusion and or retrocardiac atelectasis. 3.  Cardiomegaly. Electronically Signed   By: Delanna Ahmadi M.D.   On: 12/16/2021 09:07   DG CHEST PORT 1 VIEW  Result Date: 12/15/2021 CLINICAL DATA:  Pleural effusion, difficulty breathing EXAM: PORTABLE CHEST 1 VIEW COMPARISON:  Previous  studies including the examination of 12/14/2021 FINDINGS: Transverse diameter of heart is increased. There are no signs of pulmonary edema. Infiltrates are seen in both lower lung fields with interval partial clearing. Right chest tube is noted with its tip in the medial right lower lung fields. Tip of dialysis catheter is seen in the region right atrium. There is no pneumothorax. IMPRESSION: Cardiomegaly. There are no signs of pulmonary edema. There is interval improvement in aeration of both lower lung fields suggesting partial clearing of atelectasis/pneumonia and possibly decrease in pleural effusions. There is no pneumothorax. Electronically Signed   By: Elmer Picker M.D.   On: 12/15/2021 08:38   DG Chest Port 1 View  Result Date: 12/14/2021 CLINICAL DATA:  Chest tube placement, recheck right pneumothorax. EXAM: PORTABLE CHEST 1 VIEW COMPARISON:  Chest CT with contrast yesterday at 1:18 p.m., portable chest yesterday at 5:01 p.m. FINDINGS: Pigtail right pleural drain again has its tip in the medial base of the right chest. Minimal right apical pneumothorax estimated 3% or less continues to be noted and is unchanged. Right basilar airspace disease and at least a small pleural effusion continue to be noted with dense left lower lobe consolidation still present and a small left effusion. The more cephalad lung fields are clear. There is a right IJ double-lumen catheter with the tip in the right atrium. There is cardiomegaly with normal caliber central vessels and stable mediastinum. IMPRESSION: There are no changes in the overall aeration from yesterday's most recent film. Minimal 3% or less right apical pneumothorax persists as well as right basilar airspace disease and small right effusion. Aeration on left is likewise unchanged with denser lower lobe consolidation and small effusion on this side as well. Chest tube positioning is unaltered. Electronically Signed   By: Telford Nab M.D.   On:  12/14/2021 03:54   DG Chest Port 1 View  Result Date: 12/13/2021 CLINICAL DATA:  Right pleural  effusion.  Chest tube placement. EXAM: PORTABLE CHEST 1 VIEW COMPARISON:  12/13/2021 FINDINGS: A new right pleural pigtail catheter is seen in the lower right hemithorax, with decreased size of small right pleural effusion since prior study. A tiny less than 5% right apical pneumothorax is now seen. Airspace disease in the right lower lung shows no significant change. Small left pleural effusion and left retrocardiac atelectasis or infiltrate, also unchanged. Cardiomegaly stable. A right jugular dual-lumen central venous catheter is seen with tip overlying the mid right atrium. IMPRESSION: Decreased right pleural effusion following pleural drainage catheter placement. Tiny less than 5% right apical pneumothorax. Stable right lower lung airspace disease. Stable small left pleural effusion, and left basilar atelectasis versus infiltrate. Stable cardiomegaly. Electronically Signed   By: Marlaine Hind M.D.   On: 12/13/2021 17:29   DG Chest Port 1 View  Result Date: 12/13/2021 CLINICAL DATA:  Questionable sepsis.  Evaluate for abnormality. EXAM: PORTABLE CHEST 1 VIEW COMPARISON:  06/15/2021 FINDINGS: There is a right jugular dialysis catheter with the tip extending into the right atrium. Bibasilar chest densities are compatible with pleural effusions and basilar consolidation or airspace disease. Pleural effusions are likely moderate in size particularly on the right side. Negative for pneumothorax. Cardiac silhouette appears to be enlarged and stable from the previous examination. IMPRESSION: New or enlarged bibasilar chest densities. Findings are suggestive for combination of pleural effusions with consolidation/airspace disease. Underlying pneumonia cannot be excluded. Dialysis catheter as described. Electronically Signed   By: Markus Daft M.D.   On: 12/13/2021 11:37   ECHOCARDIOGRAM COMPLETE  Result Date:  12/13/2021    ECHOCARDIOGRAM REPORT   Patient Name:   Cathy [name] Date of Exam: 12/13/2021 Medical Rec #:  [id number]        Height:       67.0 in Accession #:    [id number]       Weight:       146.2 lb Date of Birth:  09-22-1981       BSA:          1.770 m Patient Age:    50 years         BP:           178/136 mmHg Patient Gender: F                HR:           93 bpm. Exam Location:  Inpatient Procedure: 2D Echo, Cardiac Doppler and Color Doppler Indications:    plureral effusion  History:        Patient has no prior history of Echocardiogram examinations.                 Signs/Symptoms:Dyspnea. GERD / PLEURAL EFFUSION.  Sonographer:    Beryle Beams Referring Phys: [id number] [place]  1. Left ventricular ejection fraction, by estimation, is 25 to 30%. The left ventricle has severely decreased function. The left ventricle demonstrates global hypokinesis. The left ventricular internal cavity size was mildly dilated. There is mild concentric left ventricular hypertrophy. Left ventricular diastolic parameters are consistent with Grade II diastolic dysfunction (pseudonormalization).  2. Right ventricular systolic function mildly-to-moderately reduced. The right ventricular size is mildly-to-moderately enlarged. There is mildly elevated pulmonary artery systolic pressure. The estimated right ventricular systolic pressure is 49.7 mmHg.  3. Left atrial size was severely dilated.  4. Right atrial size was moderately dilated.  5. Moderate pericardial effusion. The pericardial effusion is circumferential. There is no  evidence of cardiac tamponade.  6. The mitral valve is abnormal. There is mitral annular dilation with tethering of the posterior leaflet and mild mitral regurgitation.  7. Tricuspid valve regurgitation is moderate.  8. The aortic valve is tricuspid. There is mild thickening of the aortic valve. Aortic valve regurgitation is mild. No aortic stenosis is present.  9. The inferior vena cava is  dilated in size with <50% respiratory variability, suggesting right atrial pressure of 15 mmHg. Comparison(s): Compared to prior TTE in 05/2021, there is no significant change. There continues to be biventricular dysfunction and a moderate pericardial effusion present without tamponade. FINDINGS  Left Ventricle: Left ventricular ejection fraction, by estimation, is 25 to 30%. The left ventricle has severely decreased function. The left ventricle demonstrates global hypokinesis. The left ventricular internal cavity size was mildly dilated. There is mild concentric left ventricular hypertrophy. Left ventricular diastolic parameters are consistent with Grade II diastolic dysfunction (pseudonormalization). Right Ventricle: The right ventricular size is mildly-to-moderately enlarged. No increase in right ventricular wall thickness. Right ventricular systolic function mildly-to-moderately reduced. There is mildly elevated pulmonary artery systolic pressure. The tricuspid regurgitant velocity is 2.62 m/s, and with an assumed right atrial pressure of 10 mmHg, the estimated right ventricular systolic pressure is 62.6 mmHg. Left Atrium: Left atrial size was severely dilated. Right Atrium: Right atrial size was moderately dilated. Pericardium: Measuring up to 1.5cm at end-diastole. A moderately sized pericardial effusion is present. The pericardial effusion is circumferential. There is no evidence of cardiac tamponade. Mitral Valve: The mitral valve is abnormal. There is mild thickening of the mitral valve leaflet(s). There is mild calcification of the mitral valve leaflet(s). There is mild mitral regurgitation. Tricuspid Valve: The tricuspid valve is normal in structure. Tricuspid valve regurgitation is moderate. Aortic Valve: The aortic valve is tricuspid. There is mild thickening of the aortic valve. Aortic valve regurgitation is mild. Aortic regurgitation PHT measures 462 msec. No aortic stenosis is present. Aortic valve  mean gradient measures 4.0 mmHg. Aortic  valve peak gradient measures 6.1 mmHg. Aortic valve area, by VTI measures 1.18 cm. Pulmonic Valve: The pulmonic valve was normal in structure. Pulmonic valve regurgitation is mild. Aorta: The aortic root and ascending aorta are structurally normal, with no evidence of dilitation. Venous: The inferior vena cava is dilated in size with less than 50% respiratory variability, suggesting right atrial pressure of 15 mmHg. IAS/Shunts: The atrial septum is grossly normal.  LEFT VENTRICLE PLAX 2D LVIDd:         2.32 cm      Diastology LVIDs:         3.90 cm      LV e' medial:    6.20 cm/s LV PW:         1.50 cm      LV E/e' medial:  16.6 LV IVS:        3.36 cm      LV e' lateral:   8.81 cm/s LVOT diam:     1.70 cm      LV E/e' lateral: 11.7 LV SV:         20 LV SV Index:   11 LVOT Area:     2.27 cm  LV Volumes (MOD) LV vol d, MOD A2C: 132.0 ml LV vol d, MOD A4C: 117.0 ml LV vol s, MOD A2C: 98.8 ml LV vol s, MOD A4C: 107.0 ml LV SV MOD A2C:     33.2 ml LV SV MOD A4C:     117.0  ml LV SV MOD BP:      30.0 ml RIGHT VENTRICLE             IVC RV S prime:     11.30 cm/s  IVC diam: 2.20 cm TAPSE (M-mode): 2.0 cm LEFT ATRIUM              Index        RIGHT ATRIUM           Index LA diam:        4.30 cm  2.43 cm/m   RA Area:     14.10 cm LA Vol (A2C):   106.0 ml 59.89 ml/m  RA Volume:   33.40 ml  18.87 ml/m LA Vol (A4C):   73.0 ml  41.24 ml/m LA Biplane Vol: 89.6 ml  50.62 ml/m  AORTIC VALVE                    PULMONIC VALVE AV Area (Vmax):    1.40 cm     PV Vmax:       0.69 m/s AV Area (Vmean):   1.19 cm     PV Vmean:      37.100 cm/s AV Area (VTI):     1.18 cm     PV VTI:        0.066 m AV Vmax:           123.00 cm/s  PV Peak grad:  1.9 mmHg AV Vmean:          91.700 cm/s  PV Mean grad:  1.0 mmHg AV VTI:            0.171 m AV Peak Grad:      6.1 mmHg AV Mean Grad:      4.0 mmHg LVOT Vmax:         75.80 cm/s LVOT Vmean:        48.200 cm/s LVOT VTI:          0.089 m LVOT/AV VTI  ratio: 0.52 AI PHT:            462 msec  AORTA Ao Root diam: 2.70 cm Ao Asc diam:  2.30 cm MITRAL VALVE                TRICUSPID VALVE MV Area (PHT): 5.34 cm     TV Peak grad:   68.6 mmHg MV Decel Time: 142 msec     TV Mean grad:   54.0 mmHg MV E velocity: 103.00 cm/s  TV Vmax:        4.14 m/s MV A velocity: 60.70 cm/s   TV Vmean:       355.0 cm/s MV E/A ratio:  1.70         TV VTI:         1.26 msec                             TR Peak grad:   27.5 mmHg                             TR Vmax:        262.00 cm/s                              SHUNTS  Systemic VTI:  0.09 m                             Systemic Diam: 1.70 cm Gwyndolyn Kaufman MD Electronically signed by Gwyndolyn Kaufman MD Signature Date/Time: 12/13/2021/6:58:25 PM    Final    ECHOCARDIOGRAM LIMITED  Result Date: 12/27/2021    ECHOCARDIOGRAM LIMITED REPORT   Patient Name:   Cathy [name] Date of Exam: 12/27/2021 Medical Rec #:  [id number]        Height:       67.0 in Accession #:    [id number]       Weight:       102.3 lb Date of Birth:  06-29-1981       BSA:          1.521 m Patient Age:    41 years         BP:           175/118 mmHg Patient Gender: F                HR:           73 bpm. Exam Location:  Inpatient Procedure: Limited Echo, Limited Color Doppler and Cardiac Doppler Indications:    pericardial effusion  History:        Patient has prior history of Echocardiogram examinations, most                 recent 12/13/2021. End stage renal disease. Lupus.; Risk                 Factors:Hypertension.  Sonographer:    Johny Chess RDCS Referring Phys: [id number] [place]  1. Left ventricular ejection fraction, by estimation, is 25 to 30%. The left ventricle has severely decreased function. The left ventricle demonstrates global hypokinesis.  2. Right ventricular systolic function is moderately reduced. The right ventricular size is normal. There is normal pulmonary artery systolic pressure. The  estimated right ventricular systolic pressure is 97.2 mmHg.  3. Moderate pericardial effusion. The pericardial effusion is circumferential.  4. The mitral valve is normal in structure. Mild mitral valve regurgitation. No evidence of mitral stenosis.  5. The aortic valve is normal in structure. Aortic valve regurgitation is trivial. No aortic stenosis is present.  6. The inferior vena cava is normal in size with greater than 50% respiratory variability, suggesting right atrial pressure of 3 mmHg. FINDINGS  Left Ventricle: Left ventricular ejection fraction, by estimation, is 25 to 30%. The left ventricle has severely decreased function. The left ventricle demonstrates global hypokinesis. The left ventricular internal cavity size was normal in size. There is no left ventricular hypertrophy. Right Ventricle: The right ventricular size is normal. No increase in right ventricular wall thickness. Right ventricular systolic function is moderately reduced. There is normal pulmonary artery systolic pressure. The tricuspid regurgitant velocity is 2.74 m/s, and with an assumed right atrial pressure of 3 mmHg, the estimated right ventricular systolic pressure is 82.0 mmHg. Left Atrium: Left atrial size was normal in size. Right Atrium: Right atrial size was normal in size. Pericardium: A moderately sized pericardial effusion is present. The pericardial effusion is circumferential. Mitral Valve: The mitral valve is normal in structure. Mild mitral valve regurgitation. No evidence of mitral valve stenosis. Tricuspid Valve: The tricuspid valve is normal in structure. Tricuspid valve regurgitation is mild . No evidence of tricuspid stenosis. Aortic Valve: The aortic valve is normal  in structure. Aortic valve regurgitation is trivial. No aortic stenosis is present. Pulmonic Valve: The pulmonic valve was normal in structure. Pulmonic valve regurgitation is trivial. No evidence of pulmonic stenosis. Aorta: The aortic root is normal in  size and structure. Venous: The inferior vena cava is normal in size with greater than 50% respiratory variability, suggesting right atrial pressure of 3 mmHg. IAS/Shunts: No atrial level shunt detected by color flow Doppler. LEFT VENTRICLE PLAX 2D LVIDd:         4.90 cm Diastology LVIDs:         4.00 cm LV e' medial:  6.31 cm/s LV PW:         1.50 cm LV e' lateral: 5.55 cm/s LV IVS:        1.10 cm  IVC IVC diam: 1.50 cm  AORTA Ao Asc diam: 3.20 cm TRICUSPID VALVE TR Peak grad:   30.0 mmHg TR Vmax:        274.00 cm/s Fransico Him MD Electronically signed by Fransico Him MD Signature Date/Time: 12/27/2021/10:32:35 AM    Final    CT CHEST ABDOMEN PELVIS WO CONTRAST  Result Date: 12/27/2021 CLINICAL DATA:  None traumatic chest wall pain. Concern for infection or inflammation. EXAM: CT CHEST, ABDOMEN AND PELVIS WITHOUT CONTRAST TECHNIQUE: Multidetector CT imaging of the chest, abdomen and pelvis was performed following the standard protocol without IV contrast. RADIATION DOSE REDUCTION: This exam was performed according to the departmental dose-optimization program which includes automated exposure control, adjustment of the mA and/or kV according to patient size and/or use of iterative reconstruction technique. COMPARISON:  CT of the abdomen pelvis dated 12/13/2021. FINDINGS: Evaluation of this exam is limited in the absence of intravenous contrast. Evaluation is also limited due to streak artifact caused by overlying support wires. CT CHEST FINDINGS Cardiovascular: There is cardiomegaly. Dialysis catheter with tip at the cavoatrial junction. There is moderate pericardial effusion measuring 2.3 cm in thickness, increased since the prior CT. Correlation with echocardiogram recommended to exclude cardiac tamponade. The thoracic aorta and central pulmonary arteries are grossly unremarkable but poorly visualized. Mediastinum/Nodes: No hilar or mediastinal adenopathy. The esophagus and the thyroid gland are grossly  unremarkable. No mediastinal fluid collection. Lungs/Pleura: Small left pleural effusion. There is diffuse interstitial and interlobular septal prominence of the left lung concerning for asymmetric edema. Patchy area of consolidation with air bronchogram in the right middle lobe consistent with atelectasis or pneumonia. No pneumothorax. The central airways are patent. Musculoskeletal: No chest wall mass or suspicious bone lesions identified. CT ABDOMEN PELVIS FINDINGS No intra-abdominal free air.  Small ascites. Hepatobiliary: The liver is unremarkable. No intrahepatic biliary dilatation. No calcified gallstone. Pancreas: Unremarkable. No pancreatic ductal dilatation or surrounding inflammatory changes. Spleen: Normal in size without focal abnormality. Adrenals/Urinary Tract: The adrenal glands unremarkable. There is no hydronephrosis or nephrolithiasis on either side. The visualized ureters and urinary bladder appear unremarkable. Stomach/Bowel: There is no bowel obstruction. The appendix is unremarkable as visualized. Vascular/Lymphatic: The abdominal aorta and IVC are grossly unremarkable. No portal venous gas. Reproductive: The uterus is anteverted. Bilateral tubal ligation clips. Other: None Musculoskeletal: No acute or significant osseous findings. IMPRESSION: 1. Cardiomegaly with moderate pericardial effusion, increased since the prior CT. Correlation with echocardiogram recommended to exclude cardiac tamponade. 2. Small left pleural effusion. 3. Patchy area of consolidation with air bronchogram in the right middle lobe consistent with atelectasis or pneumonia. 4. Small ascites. 5. No hydronephrosis or nephrolithiasis. 6. No bowel obstruction. Electronically Signed   By: Milas Hock  Radparvar M.D.   On: 12/27/2021 02:36      Subjective: Patient is feeling well, her chest pain has improved in intensity, no nausea or vomiting, she is anxious about going home.   Discharge Exam: Vitals:   12/30/21 0612  12/30/21 0735  BP: (!) 146/104 (!) 147/112  Pulse:  67  Resp:  20  Temp:  97.9 F (36.6 C)  SpO2:  98%   Vitals:   12/30/21 0500 12/30/21 0608 12/30/21 0612 12/30/21 0735  BP:   (!) 146/104 (!) 147/112  Pulse:  62  67  Resp:  20  20  Temp:  97.9 F (36.6 C)  97.9 F (36.6 C)  TempSrc:  Oral  Oral  SpO2:    98%  Weight: 52 kg       General: Not in pain or dyspnea  Neurology: Awake and alert, non focal  E ENT: no pallor, no icterus, oral mucosa moist Cardiovascular: No JVD. S1-S2 present, rhythmic, no gallops, pericardial rub with less intensity.  . No lower extremity edema. Pulmonary: positive breath sounds bilaterally, with no wheezing or rales. Gastrointestinal. Abdomen soft and non tender Musculoskeletal: no joint deformities   The results of significant diagnostics from this hospitalization (including imaging, microbiology, ancillary and laboratory) are listed below for reference.     Microbiology: Recent Results (from the past 240 hour(s))  Culture, blood (routine x 2)     Status: None (Preliminary result)   Collection Time: 12/27/21 12:18 AM   Specimen: BLOOD RIGHT FOREARM  Result Value Ref Range Status   Specimen Description BLOOD RIGHT FOREARM  Final   Special Requests   Final    BOTTLES DRAWN AEROBIC AND ANAEROBIC Blood Culture results may not be optimal due to an excessive volume of blood received in culture bottles   Culture   Final    NO GROWTH 2 DAYS Performed at [facility], [place] [address], [place], [place] [address]    Report Status PENDING  Incomplete  Resp Panel by RT-PCR (Flu A&B, Covid) Nasopharyngeal Swab     Status: None   Collection Time: 12/27/21 12:21 AM   Specimen: Nasopharyngeal Swab; Nasopharyngeal(NP) swabs in vial transport medium  Result Value Ref Range Status   SARS Coronavirus 2 by RT PCR NEGATIVE NEGATIVE Final    Comment: (NOTE) SARS-CoV-2 target nucleic acids are NOT DETECTED.  The SARS-CoV-2 RNA is generally detectable  in upper respiratory specimens during the acute phase of infection. The lowest concentration of SARS-CoV-2 viral copies this assay can detect is 138 copies/mL. A negative result does not preclude SARS-Cov-2 infection and should not be used as the sole basis for treatment or other patient management decisions. A negative result may occur with  improper specimen collection/handling, submission of specimen other than nasopharyngeal swab, presence of viral mutation(s) within the areas targeted by this assay, and inadequate number of viral copies(<138 copies/mL). A negative result must be combined with clinical observations, patient history, and epidemiological information. The expected result is Negative.  Fact Sheet for Patients:  EntrepreneurPulse.com.au  Fact Sheet for Healthcare Providers:  IncredibleEmployment.be  This test is no t yet approved or cleared by the [place] FDA and  has been authorized for detection and/or diagnosis of SARS-CoV-2 by FDA under an Emergency Use Authorization (EUA). This EUA will remain  in effect (meaning this test can be used) for the duration of the COVID-19 declaration under Section 564(b)(1) of the Act, 21 U.S.C.section 360bbb-3(b)(1), unless the authorization is terminated  or  revoked sooner.       Influenza A by PCR NEGATIVE NEGATIVE Final   Influenza B by PCR NEGATIVE NEGATIVE Final    Comment: (NOTE) The Xpert Xpress SARS-CoV-2/FLU/RSV plus assay is intended as an aid in the diagnosis of influenza from Nasopharyngeal swab specimens and should not be used as a sole basis for treatment. Nasal washings and aspirates are unacceptable for Xpert Xpress SARS-CoV-2/FLU/RSV testing.  Fact Sheet for Patients: EntrepreneurPulse.com.au  Fact Sheet for Healthcare Providers: IncredibleEmployment.be  This test is not yet approved or cleared by the [place] FDA and has  been authorized for detection and/or diagnosis of SARS-CoV-2 by FDA under an Emergency Use Authorization (EUA). This EUA will remain in effect (meaning this test can be used) for the duration of the COVID-19 declaration under Section 564(b)(1) of the Act, 21 U.S.C. section 360bbb-3(b)(1), unless the authorization is terminated or revoked.  Performed at [facility], [place] [address] [place], [place] [address]   Culture, blood (routine x 2)     Status: None (Preliminary result)   Collection Time: 12/27/21 12:23 AM   Specimen: BLOOD LEFT FOREARM  Result Value Ref Range Status   Specimen Description BLOOD LEFT FOREARM  Final   Special Requests   Final    BOTTLES DRAWN AEROBIC AND ANAEROBIC Blood Culture results may not be optimal due to an excessive volume of blood received in culture bottles   Culture   Final    NO GROWTH 2 DAYS Performed at [facility], [place] [address] [place], [place] [address]    Report Status PENDING  Incomplete     Labs: BNP (last 3 results) Recent Labs    01/18/21 1952 12/13/21 1005 12/26/21 2049  BNP >4,500.0* >4,500.0* >8,832.5*   Basic Metabolic Panel: Recent Labs  Lab 12/26/21 2049 12/28/21 0232 12/29/21 0141 12/29/21 1004 12/30/21 0313  NA 134* 131* 130* 129* 129*  K 4.3 5.4* 5.1 5.0 5.1  CL 91* 93* 90* 91* 91*  CO2 _0 GLUCOSE 111* 107* 101* 100* 169*  BUN 29* 49* 28* 33* 54*  CREATININE 6.74* 9.48* 6.58* 7.22* 8.56*  CALCIUM 8.1* 8.1* 8.3* 8.2* 8.5*  MG  --  2.1  --   --   --   PHOS  --   --   --  5.9*  --    Liver Function Tests: Recent Labs  Lab 12/27/21 0116 12/28/21 0232 12/29/21 1004  AST 19 13*  --   ALT 19 15  --   ALKPHOS 318* 265*  --   BILITOT 0.6 0.3  --   PROT 6.9 6.0*  --   ALBUMIN 2.5* 2.2* 2.2*   Recent Labs  Lab 12/27/21 0116  LIPASE 40   No results for input(s): AMMONIA in the last 168 hours. CBC: Recent Labs  Lab 12/26/21 2049 12/28/21 0232 12/28/21 2020 12/29/21 0141  12/30/21 0313  WBC 10.4 9.0  --  8.1 9.5  NEUTROABS  --  7.6  --   --   --   HGB 7.5* 6.8* 7.2* 7.4* 8.9*  HCT 25.0* 21.6* 23.1* 23.8* 27.2*  MCV 87.7 86.1  --  85.9 84.2  PLT 442* 352  --  317 285   Cardiac Enzymes: No results for input(s): CKTOTAL, CKMB, CKMBINDEX, TROPONINI in the last 168 hours. BNP: Invalid input(s): POCBNP CBG: No results for input(s): GLUCAP in the last 168 hours. D-Dimer No results for input(s): DDIMER in the last 72 hours.  Hgb A1c No results for input(s): HGBA1C in the last 72 hours. Lipid Profile No results for input(s): CHOL, HDL, LDLCALC, TRIG, CHOLHDL, LDLDIRECT in the last 72 hours. Thyroid function studies No results for input(s): TSH, T4TOTAL, T3FREE, THYROIDAB in the last 72 hours.  Invalid input(s): FREET3 Anemia work up No results for input(s): VITAMINB12, FOLATE, FERRITIN, TIBC, IRON, RETICCTPCT in the last 72 hours. Urinalysis    Component Value Date/Time   COLORURINE YELLOW 11/29/2020 2054   APPEARANCEUR HAZY (A) 11/29/2020 2054   LABSPEC 1.015 11/29/2020 2054   PHURINE 6.0 11/29/2020 2054   GLUCOSEU NEGATIVE 11/29/2020 2054   HGBUR MODERATE (A) 11/29/2020 2054   BILIRUBINUR NEGATIVE 11/29/2020 2054   [place] 11/29/2020 2054   PROTEINUR >=300 (A) 11/29/2020 2054   UROBILINOGEN 0.2 10/17/2020 1026   NITRITE NEGATIVE 11/29/2020 2054   LEUKOCYTESUR NEGATIVE 11/29/2020 2054   Sepsis Labs Invalid input(s): PROCALCITONIN,  WBC,  LACTICIDVEN Microbiology Recent Results (from the past 240 hour(s))  Culture, blood (routine x 2)     Status: None (Preliminary result)   Collection Time: 12/27/21 12:18 AM   Specimen: BLOOD RIGHT FOREARM  Result Value Ref Range Status   Specimen Description BLOOD RIGHT FOREARM  Final   Special Requests   Final    BOTTLES DRAWN AEROBIC AND ANAEROBIC Blood Culture results may not be optimal due to an excessive volume of blood received in culture bottles   Culture   Final    NO GROWTH 2  DAYS Performed at [facility], [place] [address], [place], [place] [address]    Report Status PENDING  Incomplete  Resp Panel by RT-PCR (Flu A&B, Covid) Nasopharyngeal Swab     Status: None   Collection Time: 12/27/21 12:21 AM   Specimen: Nasopharyngeal Swab; Nasopharyngeal(NP) swabs in vial transport medium  Result Value Ref Range Status   SARS Coronavirus 2 by RT PCR NEGATIVE NEGATIVE Final    Comment: (NOTE) SARS-CoV-2 target nucleic acids are NOT DETECTED.  The SARS-CoV-2 RNA is generally detectable in upper respiratory specimens during the acute phase of infection. The lowest concentration of SARS-CoV-2 viral copies this assay can detect is 138 copies/mL. A negative result does not preclude SARS-Cov-2 infection and should not be used as the sole basis for treatment or other patient management decisions. A negative result may occur with  improper specimen collection/handling, submission of specimen other than nasopharyngeal swab, presence of viral mutation(s) within the areas targeted by this assay, and inadequate number of viral copies(<138 copies/mL). A negative result must be combined with clinical observations, patient history, and epidemiological information. The expected result is Negative.  Fact Sheet for Patients:  EntrepreneurPulse.com.au  Fact Sheet for Healthcare Providers:  IncredibleEmployment.be  This test is no t yet approved or cleared by the [place] FDA and  has been authorized for detection and/or diagnosis of SARS-CoV-2 by FDA under an Emergency Use Authorization (EUA). This EUA will remain  in effect (meaning this test can be used) for the duration of the COVID-19 declaration under Section 564(b)(1) of the Act, 21 U.S.C.section 360bbb-3(b)(1), unless the authorization is terminated  or revoked sooner.       Influenza A by PCR NEGATIVE NEGATIVE Final   Influenza B by PCR NEGATIVE NEGATIVE Final    Comment:  (NOTE) The Xpert Xpress SARS-CoV-2/FLU/RSV plus assay is intended as an aid in the diagnosis of influenza from Nasopharyngeal swab specimens and should not be used as a sole basis for treatment. Nasal washings and aspirates  are unacceptable for Xpert Xpress SARS-CoV-2/FLU/RSV testing.  Fact Sheet for Patients: EntrepreneurPulse.com.au  Fact Sheet for Healthcare Providers: IncredibleEmployment.be  This test is not yet approved or cleared by the [place] FDA and has been authorized for detection and/or diagnosis of SARS-CoV-2 by FDA under an Emergency Use Authorization (EUA). This EUA will remain in effect (meaning this test can be used) for the duration of the COVID-19 declaration under Section 564(b)(1) of the Act, 21 U.S.C. section 360bbb-3(b)(1), unless the authorization is terminated or revoked.  Performed at [facility], [place] [address] [place], [place] [address]   Culture, blood (routine x 2)     Status: None (Preliminary result)   Collection Time: 12/27/21 12:23 AM   Specimen: BLOOD LEFT FOREARM  Result Value Ref Range Status   Specimen Description BLOOD LEFT FOREARM  Final   Special Requests   Final    BOTTLES DRAWN AEROBIC AND ANAEROBIC Blood Culture results may not be optimal due to an excessive volume of blood received in culture bottles   Culture   Final    NO GROWTH 2 DAYS Performed at [facility], [place] [address] [place], [place] [address]    Report Status PENDING  Incomplete     Time coordinating discharge: 45 minutes  SIGNED:   Tawni Millers, MD  [facility] 12/30/2021, 9:45 AM

## 2021-12-31 DIAGNOSIS — Z7689 Persons encountering health services in other specified circumstances: Secondary | ICD-10-CM | POA: Diagnosis not present

## 2021-12-31 DIAGNOSIS — Z992 Dependence on renal dialysis: Secondary | ICD-10-CM | POA: Diagnosis not present

## 2021-12-31 DIAGNOSIS — N186 End stage renal disease: Secondary | ICD-10-CM | POA: Diagnosis not present

## 2021-12-31 DIAGNOSIS — M3214 Glomerular disease in systemic lupus erythematosus: Secondary | ICD-10-CM | POA: Diagnosis not present

## 2022-01-01 LAB — CULTURE, BLOOD (ROUTINE X 2)
Culture: NO GROWTH
Culture: NO GROWTH

## 2022-01-02 DIAGNOSIS — D689 Coagulation defect, unspecified: Secondary | ICD-10-CM | POA: Diagnosis not present

## 2022-01-02 DIAGNOSIS — T8249XD Other complication of vascular dialysis catheter, subsequent encounter: Secondary | ICD-10-CM | POA: Diagnosis not present

## 2022-01-02 DIAGNOSIS — N2581 Secondary hyperparathyroidism of renal origin: Secondary | ICD-10-CM | POA: Diagnosis not present

## 2022-01-02 DIAGNOSIS — Z992 Dependence on renal dialysis: Secondary | ICD-10-CM | POA: Diagnosis not present

## 2022-01-02 DIAGNOSIS — D649 Anemia, unspecified: Secondary | ICD-10-CM | POA: Diagnosis not present

## 2022-01-02 DIAGNOSIS — Z7689 Persons encountering health services in other specified circumstances: Secondary | ICD-10-CM | POA: Diagnosis not present

## 2022-01-02 DIAGNOSIS — R52 Pain, unspecified: Secondary | ICD-10-CM | POA: Diagnosis not present

## 2022-01-02 DIAGNOSIS — D509 Iron deficiency anemia, unspecified: Secondary | ICD-10-CM | POA: Diagnosis not present

## 2022-01-02 DIAGNOSIS — E8770 Fluid overload, unspecified: Secondary | ICD-10-CM | POA: Diagnosis not present

## 2022-01-02 DIAGNOSIS — N186 End stage renal disease: Secondary | ICD-10-CM | POA: Diagnosis not present

## 2022-01-03 ENCOUNTER — Other Ambulatory Visit (HOSPITAL_COMMUNITY): Payer: Self-pay

## 2022-01-04 DIAGNOSIS — Z7689 Persons encountering health services in other specified circumstances: Secondary | ICD-10-CM | POA: Diagnosis not present

## 2022-01-05 ENCOUNTER — Encounter (HOSPITAL_COMMUNITY): Payer: Self-pay

## 2022-01-05 ENCOUNTER — Inpatient Hospital Stay (HOSPITAL_COMMUNITY)
Admission: EM | Admit: 2022-01-05 | Discharge: 2022-01-09 | DRG: 175 | Disposition: A | Discharge status: Home / Self Care | Payer: Medicare Other | Attending: Internal Medicine | Admitting: Internal Medicine

## 2022-01-05 ENCOUNTER — Other Ambulatory Visit: Payer: Self-pay

## 2022-01-05 ENCOUNTER — Emergency Department (HOSPITAL_COMMUNITY): Payer: Medicare Other

## 2022-01-05 DIAGNOSIS — I10 Essential (primary) hypertension: Secondary | ICD-10-CM | POA: Diagnosis not present

## 2022-01-05 DIAGNOSIS — Z992 Dependence on renal dialysis: Secondary | ICD-10-CM | POA: Diagnosis not present

## 2022-01-05 DIAGNOSIS — Z9851 Tubal ligation status: Secondary | ICD-10-CM

## 2022-01-05 DIAGNOSIS — I509 Heart failure, unspecified: Secondary | ICD-10-CM

## 2022-01-05 DIAGNOSIS — Z20822 Contact with and (suspected) exposure to covid-19: Secondary | ICD-10-CM | POA: Diagnosis present

## 2022-01-05 DIAGNOSIS — Z9114 Patient's other noncompliance with medication regimen: Secondary | ICD-10-CM

## 2022-01-05 DIAGNOSIS — N186 End stage renal disease: Secondary | ICD-10-CM | POA: Diagnosis not present

## 2022-01-05 DIAGNOSIS — R0602 Shortness of breath: Secondary | ICD-10-CM | POA: Diagnosis not present

## 2022-01-05 DIAGNOSIS — Z87441 Personal history of nephrotic syndrome: Secondary | ICD-10-CM | POA: Diagnosis not present

## 2022-01-05 DIAGNOSIS — K219 Gastro-esophageal reflux disease without esophagitis: Secondary | ICD-10-CM | POA: Diagnosis not present

## 2022-01-05 DIAGNOSIS — J90 Pleural effusion, not elsewhere classified: Secondary | ICD-10-CM | POA: Diagnosis not present

## 2022-01-05 DIAGNOSIS — I132 Hypertensive heart and chronic kidney disease with heart failure and with stage 5 chronic kidney disease, or end stage renal disease: Secondary | ICD-10-CM | POA: Diagnosis not present

## 2022-01-05 DIAGNOSIS — I5022 Chronic systolic (congestive) heart failure: Secondary | ICD-10-CM | POA: Diagnosis present

## 2022-01-05 DIAGNOSIS — D631 Anemia in chronic kidney disease: Secondary | ICD-10-CM | POA: Diagnosis not present

## 2022-01-05 DIAGNOSIS — Z8249 Family history of ischemic heart disease and other diseases of the circulatory system: Secondary | ICD-10-CM

## 2022-01-05 DIAGNOSIS — R079 Chest pain, unspecified: Secondary | ICD-10-CM

## 2022-01-05 DIAGNOSIS — I5043 Acute on chronic combined systolic (congestive) and diastolic (congestive) heart failure: Secondary | ICD-10-CM | POA: Diagnosis present

## 2022-01-05 DIAGNOSIS — J45909 Unspecified asthma, uncomplicated: Secondary | ICD-10-CM | POA: Diagnosis present

## 2022-01-05 DIAGNOSIS — Z7952 Long term (current) use of systemic steroids: Secondary | ICD-10-CM | POA: Diagnosis not present

## 2022-01-05 DIAGNOSIS — Z91148 Patient's other noncompliance with medication regimen for other reason: Secondary | ICD-10-CM

## 2022-01-05 DIAGNOSIS — I517 Cardiomegaly: Secondary | ICD-10-CM | POA: Diagnosis not present

## 2022-01-05 DIAGNOSIS — J189 Pneumonia, unspecified organism: Secondary | ICD-10-CM

## 2022-01-05 DIAGNOSIS — I5042 Chronic combined systolic (congestive) and diastolic (congestive) heart failure: Secondary | ICD-10-CM | POA: Diagnosis not present

## 2022-01-05 DIAGNOSIS — I129 Hypertensive chronic kidney disease with stage 1 through stage 4 chronic kidney disease, or unspecified chronic kidney disease: Secondary | ICD-10-CM | POA: Diagnosis present

## 2022-01-05 DIAGNOSIS — E875 Hyperkalemia: Secondary | ICD-10-CM

## 2022-01-05 DIAGNOSIS — M3212 Pericarditis in systemic lupus erythematosus: Secondary | ICD-10-CM | POA: Diagnosis present

## 2022-01-05 DIAGNOSIS — I161 Hypertensive emergency: Secondary | ICD-10-CM | POA: Diagnosis not present

## 2022-01-05 DIAGNOSIS — R000 Tachycardia, unspecified: Secondary | ICD-10-CM | POA: Diagnosis not present

## 2022-01-05 DIAGNOSIS — R609 Edema, unspecified: Secondary | ICD-10-CM | POA: Diagnosis not present

## 2022-01-05 DIAGNOSIS — K5900 Constipation, unspecified: Secondary | ICD-10-CM | POA: Diagnosis present

## 2022-01-05 DIAGNOSIS — I160 Hypertensive urgency: Secondary | ICD-10-CM | POA: Diagnosis present

## 2022-01-05 DIAGNOSIS — Z79899 Other long term (current) drug therapy: Secondary | ICD-10-CM | POA: Diagnosis not present

## 2022-01-05 DIAGNOSIS — J9610 Chronic respiratory failure, unspecified whether with hypoxia or hypercapnia: Secondary | ICD-10-CM | POA: Diagnosis present

## 2022-01-05 DIAGNOSIS — I2699 Other pulmonary embolism without acute cor pulmonale: Secondary | ICD-10-CM | POA: Diagnosis not present

## 2022-01-05 DIAGNOSIS — I3139 Other pericardial effusion (noninflammatory): Secondary | ICD-10-CM | POA: Diagnosis present

## 2022-01-05 DIAGNOSIS — F319 Bipolar disorder, unspecified: Secondary | ICD-10-CM | POA: Diagnosis present

## 2022-01-05 DIAGNOSIS — R0789 Other chest pain: Secondary | ICD-10-CM | POA: Diagnosis not present

## 2022-01-05 DIAGNOSIS — Z87891 Personal history of nicotine dependence: Secondary | ICD-10-CM | POA: Diagnosis not present

## 2022-01-05 DIAGNOSIS — N2581 Secondary hyperparathyroidism of renal origin: Secondary | ICD-10-CM | POA: Diagnosis present

## 2022-01-05 DIAGNOSIS — R0600 Dyspnea, unspecified: Secondary | ICD-10-CM | POA: Diagnosis not present

## 2022-01-05 LAB — I-STAT VENOUS BLOOD GAS, ED
Acid-Base Excess: 1 mmol/L (ref 0.0–2.0)
Bicarbonate: 26.3 mmol/L (ref 20.0–28.0)
Calcium, Ion: 1.12 mmol/L — ABNORMAL LOW (ref 1.15–1.40)
HCT: 28 % — ABNORMAL LOW (ref 36.0–46.0)
Hemoglobin: 9.5 g/dL — ABNORMAL LOW (ref 12.0–15.0)
O2 Saturation: 63 %
Potassium: 6 mmol/L — ABNORMAL HIGH (ref 3.5–5.1)
Sodium: 132 mmol/L — ABNORMAL LOW (ref 135–145)
TCO2: 28 mmol/L (ref 22–32)
pCO2, Ven: 41.5 mmHg — ABNORMAL LOW (ref 44.0–60.0)
pH, Ven: 7.41 (ref 7.250–7.430)
pO2, Ven: 32 mmHg (ref 32.0–45.0)

## 2022-01-05 LAB — BASIC METABOLIC PANEL
Anion gap: 14 (ref 5–15)
BUN: 80 mg/dL — ABNORMAL HIGH (ref 6–20)
CO2: 23 mmol/L (ref 22–32)
Calcium: 8.9 mg/dL (ref 8.9–10.3)
Chloride: 97 mmol/L — ABNORMAL LOW (ref 98–111)
Creatinine, Ser: 8.71 mg/dL — ABNORMAL HIGH (ref 0.44–1.00)
GFR, Estimated: 6 mL/min — ABNORMAL LOW (ref >60)
Glucose, Bld: 109 mg/dL — ABNORMAL HIGH (ref 70–99)
Potassium: 6 mmol/L — ABNORMAL HIGH (ref 3.5–5.1)
Sodium: 134 mmol/L — ABNORMAL LOW (ref 135–145)

## 2022-01-05 LAB — CBC
HCT: 29.8 % — ABNORMAL LOW (ref 36.0–46.0)
Hemoglobin: 9.1 g/dL — ABNORMAL LOW (ref 12.0–15.0)
MCH: 27.5 pg (ref 26.0–34.0)
MCHC: 30.5 g/dL (ref 30.0–36.0)
MCV: 90 fL (ref 80.0–100.0)
Platelets: 378 10*3/uL (ref 150–400)
RBC: 3.31 MIL/uL — ABNORMAL LOW (ref 3.87–5.11)
RDW: 16 % — ABNORMAL HIGH (ref 11.5–15.5)
WBC: 11.6 10*3/uL — ABNORMAL HIGH (ref 4.0–10.5)
nRBC: 0.5 % — ABNORMAL HIGH (ref 0.0–0.2)

## 2022-01-05 LAB — CBG MONITORING, ED: Glucose-Capillary: 220 mg/dL — ABNORMAL HIGH (ref 70–99)

## 2022-01-05 LAB — TROPONIN I (HIGH SENSITIVITY)
Troponin I (High Sensitivity): 38 ng/L — ABNORMAL HIGH (ref <18)
Troponin I (High Sensitivity): 41 ng/L — ABNORMAL HIGH (ref <18)

## 2022-01-05 LAB — I-STAT BETA HCG BLOOD, ED (MC, WL, AP ONLY): I-stat hCG, quantitative: 7.2 m[IU]/mL — ABNORMAL HIGH (ref <5)

## 2022-01-05 MED ORDER — SODIUM CHLORIDE 0.9 % IV SOLN: 100.0000 mL | INTRAVENOUS | Status: DC | PRN

## 2022-01-05 MED ORDER — HEPARIN SODIUM (PORCINE) 1000 UNIT/ML DIALYSIS
1000.0000 [IU] | INTRAMUSCULAR | Status: DC | PRN
  Filled 2022-01-05 (×2): qty 1

## 2022-01-05 MED ORDER — HEPARIN SODIUM (PORCINE) 1000 UNIT/ML DIALYSIS: 1000.0000 [IU] | INTRAMUSCULAR | Status: DC | PRN

## 2022-01-05 MED ORDER — LIDOCAINE HCL (PF) 1 % IJ SOLN: 5.0000 mL | INTRAMUSCULAR | Status: DC | PRN

## 2022-01-05 MED ORDER — CALCIUM GLUCONATE 10 % IV SOLN
1.0000 g | Freq: Once | INTRAVENOUS | Status: AC
  Administered 2022-01-05: 1 g via INTRAVENOUS
  Filled 2022-01-05: qty 10

## 2022-01-05 MED ORDER — ALTEPLASE 2 MG IJ SOLR: 2.0000 mg | Freq: Once | INTRAMUSCULAR | Status: DC | PRN

## 2022-01-05 MED ORDER — ASPIRIN 325 MG PO TABS
325.0000 mg | ORAL_TABLET | Freq: Every day | ORAL | Status: DC
  Administered 2022-01-05 – 2022-01-09 (×5): 325 mg via ORAL
  Filled 2022-01-05 (×5): qty 1

## 2022-01-05 MED ORDER — PENTAFLUOROPROP-TETRAFLUOROETH EX AERO
1.0000 "application " | INHALATION_SPRAY | CUTANEOUS | Status: DC | PRN
  Filled 2022-01-05: qty 116

## 2022-01-05 MED ORDER — LIDOCAINE-PRILOCAINE 2.5-2.5 % EX CREA
1.0000 "application " | TOPICAL_CREAM | CUTANEOUS | Status: DC | PRN
  Filled 2022-01-05: qty 5

## 2022-01-05 MED ORDER — SODIUM ZIRCONIUM CYCLOSILICATE 10 G PO PACK
10.0000 g | PACK | Freq: Once | ORAL | Status: AC
  Administered 2022-01-05: 10 g via ORAL
  Filled 2022-01-05: qty 1

## 2022-01-05 MED ORDER — ALBUTEROL SULFATE (2.5 MG/3ML) 0.083% IN NEBU
10.0000 mg | INHALATION_SOLUTION | Freq: Once | RESPIRATORY_TRACT | Status: AC
  Administered 2022-01-05: 10 mg via RESPIRATORY_TRACT
  Filled 2022-01-05: qty 12

## 2022-01-05 MED ORDER — SODIUM ZIRCONIUM CYCLOSILICATE 5 G PO PACK
5.0000 g | PACK | Freq: Once | ORAL | Status: DC
  Filled 2022-01-05: qty 1

## 2022-01-05 MED ORDER — DEXTROSE 50 % IV SOLN
1.0000 | Freq: Once | INTRAVENOUS | Status: AC
  Administered 2022-01-05: 50 mL via INTRAVENOUS
  Filled 2022-01-05: qty 50

## 2022-01-05 MED ORDER — LIDOCAINE-PRILOCAINE 2.5-2.5 % EX CREA: 1.0000 "application " | TOPICAL_CREAM | CUTANEOUS | Status: DC | PRN

## 2022-01-05 MED ORDER — CHLORHEXIDINE GLUCONATE CLOTH 2 % EX PADS
6.0000 | MEDICATED_PAD | Freq: Every day | CUTANEOUS | Status: DC
  Administered 2022-01-07 – 2022-01-09 (×3): 6 via TOPICAL

## 2022-01-05 MED ORDER — INSULIN ASPART 100 UNIT/ML IV SOLN
5.0000 [IU] | Freq: Once | INTRAVENOUS | Status: AC
Start: 2022-01-05 — End: 2022-01-05
  Administered 2022-01-05: 5 [IU] via INTRAVENOUS

## 2022-01-05 MED ORDER — PENTAFLUOROPROP-TETRAFLUOROETH EX AERO: 1.0000 "application " | INHALATION_SPRAY | CUTANEOUS | Status: DC | PRN

## 2022-01-05 MED ORDER — NITROGLYCERIN IN D5W 200-5 MCG/ML-% IV SOLN
0.0000 ug/min | INTRAVENOUS | Status: DC
  Administered 2022-01-05: 5 ug/min via INTRAVENOUS
  Administered 2022-01-06: 100 ug/min via INTRAVENOUS
  Administered 2022-01-06: 85 ug/min via INTRAVENOUS
  Filled 2022-01-05 (×3): qty 250

## 2022-01-05 NOTE — ED Triage Notes (Signed)
Pt arrived via [facility] from home. Per EMS pt caox4 called c/o SOB, ascites, and edema in the lower extremities after missing dialysis yesterday. Pt last went to dialysis thurs. EMS further reports pt is hypertensive at 195/100, initial was 280/130.  CBG 140. HR 72. RR 20-24 non labored, SpO2 100% RA.

## 2022-01-05 NOTE — ED Notes (Signed)
PT getting dialysis her belongings are in the room, she will return to room

## 2022-01-05 NOTE — ED Provider Notes (Signed)
°  Physical Exam  BP (!) 183/119    Pulse 75    Temp 98.5 F (36.9 C) (Oral)    Resp (!) 22    Ht 5\' 7"  (1.702 m)    Wt 51.7 kg    SpO2 100%    BMI 17.85 kg/m   Physical Exam  Procedures  Procedures  ED Course / MDM   Clinical Course as of 01/05/22 2301  Nancy Fetter Jan 05, 2022  2054 Potassium(!): 6.0 [JL]  2054 BUN(!): 80 [JL]    Clinical Course User Index [JL] Regan Lemming, MD   Medical Decision Making Amount and/or Complexity of Data Reviewed Labs: ordered. Decision-making details documented in ED Course. Radiology: ordered.  Risk OTC drugs. Prescription drug management.   Received patient in signout.  Chest pain end-stage renal disease.  Missed dialysis.  Hyperkalemic and volume overload.  Also severe hypertension.  Continued anterior chest pain.  Troponin mildly elevated but similar to prior levels.  Prior physician had discussed with nephrology who will dialyze short-term.  Has been given temporizing measures for her hyperkalemia.  However with chest pain and severe hypertension and the hyperkalemia believe she will require admission to the hospital.  Has had recent pericarditis.  Will discuss with hospitalist       Davonna Belling, MD 01/05/22 2302

## 2022-01-05 NOTE — ED Notes (Signed)
Pt placed on 2L O2 for comfort only

## 2022-01-05 NOTE — ED Provider Notes (Signed)
[facility] EMERGENCY DEPARTMENT Provider Note   CSN: [id number] Arrival date & time: 01/05/22  1723     History  Chief Complaint  Patient presents with   Shortness of Breath    Cathy [name] is a 41 y.o. female.  HPI  41 year old female with a history of SLE, ESRD on HD Tuesday, Thursday, Saturday, diastolic CHF, HTN, chronic respiratory failure, chronic anemia, bipolar disorder who presented to the emergency department with dyspnea and chest pain in the setting of missed dialysis.  The patient history was provided with the assistance of EMS and the patient.  She states that she has had worsening shortness of breath, abdominal swelling and lower extremity edema after missing dialysis yesterday.  She last went to dialysis on Thursday.  She arrived hypertensive BP 195/100 with initial blood pressure with EMS 280/130.  Her CBG was 140.  Heart rate 72, respiratory rate in the 20s with mild tachypnea, O2 saturations 100% on room air.  The patient states that she is having substernal chest pain with no radiation with associated shortness of breath.  She was subsequently placed on 2 L O2 nasal cannula for tachypnea for comfort.  Home Medications Prior to Admission medications   Medication Sig Start Date End Date Taking? Authorizing Provider  acetaminophen (TYLENOL) 500 MG tablet Take 1,000 mg by mouth every 6 (six) hours as needed for moderate pain.    [provider]  albuterol (VENTOLIN HFA) 108 (90 Base) MCG/ACT inhaler Inhale 2 puffs into the lungs every 6 (six) hours as needed for wheezing or shortness of breath. 12/17/21 12/17/22  Rai, Ripudeep K, MD  amLODipine (NORVASC) 10 MG tablet Take 1 tablet (10 mg total) by mouth daily. 12/17/21   Rai, Vernelle Emerald, MD  carvedilol (COREG) 12.5 MG tablet Take 1 tablet (12.5 mg total) by mouth 2 (two) times daily with a meal. 12/17/21   Rai, Ripudeep K, MD  cloNIDine (CATAPRES) 0.3 MG tablet Take 0.3 mg by mouth 3 (three)  times daily.    [provider]  colchicine 0.6 MG tablet Take 0.5 tablets (0.3 mg total) by mouth daily. 12/31/21   Arrien, Jimmy Picket, MD  hydrALAZINE (APRESOLINE) 25 MG tablet Take 1 tablet (25 mg total) by mouth 3 (three) times daily. 12/17/21   Rai, Vernelle Emerald, MD  hydroxychloroquine (PLAQUENIL) 200 MG tablet Take 0.5 tablets (100 mg total) by mouth every other day. Take one table after hemodialysis 12/31/21   Arrien, Jimmy Picket, MD  hydrOXYzine (ATARAX) 25 MG tablet Take 1 tablet (25 mg total) by mouth 3 (three) times daily as needed for itching or anxiety. 12/17/21   Rai, Vernelle Emerald, MD  ipratropium (ATROVENT HFA) 17 MCG/ACT inhaler Inhale 2 puffs into the lungs every 6 (six) hours as needed for wheezing. 12/17/21 12/17/22  Rai, Vernelle Emerald, MD  methocarbamol (ROBAXIN) 500 MG tablet Take 1 tablet (500 mg total) by mouth every 6 (six) hours as needed for muscle spasms. 12/17/21   Rai, Vernelle Emerald, MD  Nutritional Supplements (,FEEDING SUPPLEMENT, PROSOURCE PLUS) liquid Take 30 mLs by mouth 3 (three) times daily between meals. Patient not taking: Reported on 12/14/2021 06/14/21   Edwin Dada, MD  oxyCODONE (OXY IR/ROXICODONE) 5 MG immediate release tablet Take 1 tablet (5 mg total) by mouth every 6 (six) hours as needed for severe pain. 12/30/21   Arrien, Jimmy Picket, MD  pantoprazole (PROTONIX) 40 MG tablet Take 40 mg by mouth daily.    [provider]  predniSONE (DELTASONE) 20 MG tablet Take 2 tablets (40 mg total) by mouth daily with breakfast. 12/31/21 01/30/22  Arrien, Jimmy Picket, MD  sevelamer carbonate (RENVELA) 800 MG tablet Take 800 mg by mouth in the morning and at bedtime. 08/24/21   [provider]  torsemide (DEMADEX) 20 MG tablet Take 20 mg by mouth 2 (two) times daily.    [provider]  fenofibrate 160 MG tablet Take 1 tablet (160 mg total) by mouth daily. 12/31/20 01/20/21  Regalado, Cassie Freer, MD      Allergies    Patient has  no known allergies.    Review of Systems   Review of Systems  Respiratory:  Positive for shortness of breath.   Cardiovascular:  Positive for chest pain.   Physical Exam Updated Vital Signs BP (!) 178/132    Pulse 72    Temp 98.5 F (36.9 C) (Oral)    Resp 16    Ht 5\' 7"  (1.702 m)    Wt 51.7 kg    SpO2 99%    BMI 17.85 kg/m  Physical Exam Vitals and nursing note reviewed.  Constitutional:      General: She is in acute distress.  HENT:     Head: Normocephalic and atraumatic.  Eyes:     Conjunctiva/sclera: Conjunctivae normal.     Pupils: Pupils are equal, round, and reactive to light.  Cardiovascular:     Rate and Rhythm: Normal rate and regular rhythm.  Pulmonary:     Effort: Tachypnea and respiratory distress present.     Breath sounds: Rales present.  Abdominal:     General: There is no distension.     Tenderness: There is no guarding.  Musculoskeletal:        General: No deformity or signs of injury.     Cervical back: Neck supple.     Right lower leg: Edema present.     Left lower leg: Edema present.  Skin:    Findings: No lesion or rash.  Neurological:     General: No focal deficit present.     Mental Status: She is alert. Mental status is at baseline.    ED Results / Procedures / Treatments   Labs (all labs ordered are listed, but only abnormal results are displayed) Labs Reviewed  BASIC METABOLIC PANEL - Abnormal; Notable for the following components:      Result Value   Sodium 134 (*)    Potassium 6.0 (*)    Chloride 97 (*)    Glucose, Bld 109 (*)    BUN 80 (*)    Creatinine, Ser 8.71 (*)    GFR, Estimated 6 (*)    All other components within normal limits  CBC - Abnormal; Notable for the following components:   WBC 11.6 (*)    RBC 3.31 (*)    Hemoglobin 9.1 (*)    HCT 29.8 (*)    RDW 16.0 (*)    nRBC 0.5 (*)    All other components within normal limits  I-STAT VENOUS BLOOD GAS, ED - Abnormal; Notable for the following components:   pCO2, Ven  41.5 (*)    Sodium 132 (*)    Potassium 6.0 (*)    Calcium, Ion 1.12 (*)    HCT 28.0 (*)    Hemoglobin 9.5 (*)    All other components within normal limits  TROPONIN I (HIGH SENSITIVITY) - Abnormal; Notable for the following components:   Troponin I (High Sensitivity) 38 (*)  All other components within normal limits  I-STAT BETA HCG BLOOD, ED ([facility], [facility], [facility] ONLY)    EKG EKG Interpretation  Date/Time:  Sunday January 05 2022 20:01:00 EST Ventricular Rate:  71 PR Interval:  145 QRS Duration: 89 QT Interval:  434 QTC Calculation: 472 R Axis:   44 Text Interpretation: Sinus rhythm Probable left atrial enlargement Low voltage, extremity leads Abnormal R-wave progression, late transition Nonspecific T abnormalities, lateral leads Peaked T waves Reconfirmed by Regan Lemming ([id number]) on 01/05/2022 8:21:46 PM  Radiology DG Chest Port 1 View  Result Date: 01/05/2022 CLINICAL DATA:  Dyspnea, missed dialysis EXAM: PORTABLE CHEST 1 VIEW COMPARISON:  12/26/2021 FINDINGS: Moderate to severe cardiomegaly is stable. Pulmonary vascularity is normal. Small bilateral pleural effusions are present, left greater than right. No pneumothorax. No confluent pulmonary infiltrate. Right internal jugular hemodialysis catheter tip noted within the right atrium. No acute bone abnormality. IMPRESSION: Stable cardiomegaly. Small bilateral pleural effusions, left greater than right. Electronically Signed   By: Fidela Salisbury M.D.   On: 01/05/2022 19:52    Procedures Procedures    Medications Ordered in ED Medications  nitroGLYCERIN 50 mg in dextrose 5 % 250 mL (0.2 mg/mL) infusion (20 mcg/min Intravenous Rate/Dose Change 01/05/22 2058)  albuterol (PROVENTIL) (2.5 MG/3ML) 0.083% nebulizer solution 10 mg (has no administration in time range)  aspirin tablet 325 mg (has no administration in time range)  sodium zirconium cyclosilicate (LOKELMA) packet 5 g (has no administration in time range)  calcium gluconate inj 10%  (1 g) URGENT USE ONLY! (1 g Intravenous Given 01/05/22 2057)  insulin aspart (novoLOG) injection 5 Units (5 Units Intravenous Given 01/05/22 2057)    And  dextrose 50 % solution 50 mL (50 mLs Intravenous Given 01/05/22 2057)    ED Course/ Medical Decision Making/ A&P Clinical Course as of 01/05/22 2107  Sun Jan 05, 2022  2054 Potassium(!): 6.0 [JL]  2054 BUN(!): 80 [JL]    Clinical Course User Index [JL] Regan Lemming, MD                           Medical Decision Making Amount and/or Complexity of Data Reviewed Labs: ordered. Decision-making details documented in ED Course. Radiology: ordered.  Risk OTC drugs. Prescription drug management.   41 year old female with a history of SLE, ESRD on HD Tuesday, Thursday, Saturday, diastolic CHF, HTN, chronic respiratory failure, chronic anemia, bipolar disorder who presented to the emergency department with dyspnea and chest pain in the setting of missed dialysis.  The patient history was provided with the assistance of EMS and the patient.  She states that she has had worsening shortness of breath, abdominal swelling and lower extremity edema after missing dialysis yesterday.  She last went to dialysis on Thursday.  She arrived hypertensive BP 195/100 with initial blood pressure with EMS 280/130.  Her CBG was 140.  Heart rate 72, respiratory rate in the 20s with mild tachypnea, O2 saturations 100% on room air.  The patient states that she is having substernal chest pain with no radiation with associated shortness of breath.  She was subsequently placed on 2 L O2 nasal cannula for tachypnea for comfort.  Concern for hypertensive emergency in the setting of missed dialysis.  Considered flash pulmonary edema although the patient did not have significant diffuse Rales.  Concern for hypervolemia.  The patient was started on nitroglycerin gtt. and administered aspirin.  An i-STAT VBG was performed which revealed a normal pH  with a pH of 7.41, PCO2 41.5,  electrolytes significant for hyperkalemia to 6.0 with a hemoglobin of 9.5.  The patient states that she has chest pain described as a pressure in her chest.  Considered ACS versus aortic dissection versus PE.  CTA dissection study chest abdomen pelvis was ordered.  An EKG was checked which revealed some nonspecific T wave changes with peaked T waves noted and abnormal R wave progression.  No ST segment changes to indicate ACS.  Initial cardiac troponin was found to be mildly elevated to 38.  BMP was significant for hyperkalemia to 6.0 which confirms to the VBG i-STAT results.  She also had an elevated BUN to 80 and a creatinine of 8.71.  Due to concern for hyperkalemia in the setting of missed dialysis, the patient was administered calcium gluconate, insulin and dextrose and Lokelma and an albuterol nebulizer.  Dr. Osborne Casco of nephrology was consulted for emergent dialysis.  The patient remained without significant respiratory distress on a nitroglycerin GTT on 2 L O2 via nasal cannula for comfort.  She did not require BiPAP during my care.  Plan will be for emergent dialysis this evening due to the patient's hyperkalemia and hypervolemia and reassessment following dialysis.  Signout given to Dr. Alvino Chapel at 2100.    Final Clinical Impression(s) / ED Diagnoses Final diagnoses:  Hyperkalemia  Shortness of breath  Chest pain, unspecified type  Hypertensive emergency  Congestive heart failure, unspecified HF chronicity, unspecified heart failure type ([place])  ESRD (end stage renal disease) ([place])    Rx / DC Orders ED Discharge Orders     None         Regan Lemming, MD 01/05/22 2107

## 2022-01-05 NOTE — Progress Notes (Addendum)
Called by ER in regards to patient. Missed HD yesterday. Hyperkalemic, hypertensive, hypervolemic on presentation. W/ reported EKG changes. Currently on NTG gtt. Will arrange for HD overnight, discussed with HD staff. Last HD 01/02/22. Full consult to follow in AM.  Gean Quint, MD [facility]

## 2022-01-05 NOTE — ED Notes (Signed)
Assumed care of this pt at this time.

## 2022-01-06 ENCOUNTER — Observation Stay (HOSPITAL_COMMUNITY): Payer: Medicare Other

## 2022-01-06 DIAGNOSIS — I10 Essential (primary) hypertension: Secondary | ICD-10-CM | POA: Diagnosis present

## 2022-01-06 DIAGNOSIS — F319 Bipolar disorder, unspecified: Secondary | ICD-10-CM | POA: Diagnosis present

## 2022-01-06 DIAGNOSIS — I2699 Other pulmonary embolism without acute cor pulmonale: Secondary | ICD-10-CM | POA: Diagnosis not present

## 2022-01-06 DIAGNOSIS — I129 Hypertensive chronic kidney disease with stage 1 through stage 4 chronic kidney disease, or unspecified chronic kidney disease: Secondary | ICD-10-CM | POA: Diagnosis not present

## 2022-01-06 DIAGNOSIS — N390 Urinary tract infection, site not specified: Secondary | ICD-10-CM | POA: Diagnosis not present

## 2022-01-06 DIAGNOSIS — Z7952 Long term (current) use of systemic steroids: Secondary | ICD-10-CM | POA: Diagnosis not present

## 2022-01-06 DIAGNOSIS — E875 Hyperkalemia: Secondary | ICD-10-CM

## 2022-01-06 DIAGNOSIS — M3212 Pericarditis in systemic lupus erythematosus: Secondary | ICD-10-CM

## 2022-01-06 DIAGNOSIS — Z7689 Persons encountering health services in other specified circumstances: Secondary | ICD-10-CM | POA: Diagnosis not present

## 2022-01-06 DIAGNOSIS — R188 Other ascites: Secondary | ICD-10-CM | POA: Diagnosis not present

## 2022-01-06 DIAGNOSIS — Z87891 Personal history of nicotine dependence: Secondary | ICD-10-CM | POA: Diagnosis not present

## 2022-01-06 DIAGNOSIS — Z20822 Contact with and (suspected) exposure to covid-19: Secondary | ICD-10-CM | POA: Diagnosis present

## 2022-01-06 DIAGNOSIS — I5042 Chronic combined systolic (congestive) and diastolic (congestive) heart failure: Secondary | ICD-10-CM | POA: Diagnosis not present

## 2022-01-06 DIAGNOSIS — I161 Hypertensive emergency: Secondary | ICD-10-CM | POA: Diagnosis present

## 2022-01-06 DIAGNOSIS — R0602 Shortness of breath: Secondary | ICD-10-CM | POA: Diagnosis present

## 2022-01-06 DIAGNOSIS — I2693 Single subsegmental pulmonary embolism without acute cor pulmonale: Secondary | ICD-10-CM | POA: Diagnosis not present

## 2022-01-06 DIAGNOSIS — I3139 Other pericardial effusion (noninflammatory): Secondary | ICD-10-CM | POA: Diagnosis not present

## 2022-01-06 DIAGNOSIS — Z9114 Patient's other noncompliance with medication regimen: Secondary | ICD-10-CM | POA: Diagnosis not present

## 2022-01-06 DIAGNOSIS — I120 Hypertensive chronic kidney disease with stage 5 chronic kidney disease or end stage renal disease: Secondary | ICD-10-CM | POA: Diagnosis not present

## 2022-01-06 DIAGNOSIS — Z992 Dependence on renal dialysis: Secondary | ICD-10-CM | POA: Diagnosis not present

## 2022-01-06 DIAGNOSIS — D631 Anemia in chronic kidney disease: Secondary | ICD-10-CM | POA: Diagnosis not present

## 2022-01-06 DIAGNOSIS — I160 Hypertensive urgency: Secondary | ICD-10-CM | POA: Diagnosis present

## 2022-01-06 DIAGNOSIS — I132 Hypertensive heart and chronic kidney disease with heart failure and with stage 5 chronic kidney disease, or end stage renal disease: Secondary | ICD-10-CM | POA: Diagnosis present

## 2022-01-06 DIAGNOSIS — J9610 Chronic respiratory failure, unspecified whether with hypoxia or hypercapnia: Secondary | ICD-10-CM | POA: Diagnosis present

## 2022-01-06 DIAGNOSIS — Z79899 Other long term (current) drug therapy: Secondary | ICD-10-CM | POA: Diagnosis not present

## 2022-01-06 DIAGNOSIS — K5900 Constipation, unspecified: Secondary | ICD-10-CM | POA: Diagnosis present

## 2022-01-06 DIAGNOSIS — K219 Gastro-esophageal reflux disease without esophagitis: Secondary | ICD-10-CM | POA: Diagnosis present

## 2022-01-06 DIAGNOSIS — N2581 Secondary hyperparathyroidism of renal origin: Secondary | ICD-10-CM | POA: Diagnosis not present

## 2022-01-06 DIAGNOSIS — N186 End stage renal disease: Secondary | ICD-10-CM

## 2022-01-06 DIAGNOSIS — Z87441 Personal history of nephrotic syndrome: Secondary | ICD-10-CM | POA: Diagnosis not present

## 2022-01-06 DIAGNOSIS — Z9851 Tubal ligation status: Secondary | ICD-10-CM | POA: Diagnosis not present

## 2022-01-06 DIAGNOSIS — J45909 Unspecified asthma, uncomplicated: Secondary | ICD-10-CM | POA: Diagnosis present

## 2022-01-06 DIAGNOSIS — J189 Pneumonia, unspecified organism: Secondary | ICD-10-CM | POA: Diagnosis present

## 2022-01-06 LAB — CBC
HCT: 27.7 % — ABNORMAL LOW (ref 36.0–46.0)
Hemoglobin: 8.6 g/dL — ABNORMAL LOW (ref 12.0–15.0)
MCH: 27.6 pg (ref 26.0–34.0)
MCHC: 31 g/dL (ref 30.0–36.0)
MCV: 88.8 fL (ref 80.0–100.0)
Platelets: 416 10*3/uL — ABNORMAL HIGH (ref 150–400)
RBC: 3.12 MIL/uL — ABNORMAL LOW (ref 3.87–5.11)
RDW: 15.9 % — ABNORMAL HIGH (ref 11.5–15.5)
WBC: 11.7 10*3/uL — ABNORMAL HIGH (ref 4.0–10.5)
nRBC: 0.4 % — ABNORMAL HIGH (ref 0.0–0.2)

## 2022-01-06 LAB — COMPREHENSIVE METABOLIC PANEL
ALT: 21 U/L (ref 0–44)
AST: 21 U/L (ref 15–41)
Albumin: 2.6 g/dL — ABNORMAL LOW (ref 3.5–5.0)
Alkaline Phosphatase: 210 U/L — ABNORMAL HIGH (ref 38–126)
Anion gap: 14 (ref 5–15)
BUN: 48 mg/dL — ABNORMAL HIGH (ref 6–20)
CO2: 24 mmol/L (ref 22–32)
Calcium: 7.8 mg/dL — ABNORMAL LOW (ref 8.9–10.3)
Chloride: 97 mmol/L — ABNORMAL LOW (ref 98–111)
Creatinine, Ser: 5.86 mg/dL — ABNORMAL HIGH (ref 0.44–1.00)
GFR, Estimated: 9 mL/min — ABNORMAL LOW (ref >60)
Glucose, Bld: 106 mg/dL — ABNORMAL HIGH (ref 70–99)
Potassium: 5 mmol/L (ref 3.5–5.1)
Sodium: 135 mmol/L (ref 135–145)
Total Bilirubin: 0.2 mg/dL — ABNORMAL LOW (ref 0.3–1.2)
Total Protein: 6.1 g/dL — ABNORMAL LOW (ref 6.5–8.1)

## 2022-01-06 LAB — RENAL FUNCTION PANEL
Albumin: 2.4 g/dL — ABNORMAL LOW (ref 3.5–5.0)
Anion gap: 14 (ref 5–15)
BUN: 83 mg/dL — ABNORMAL HIGH (ref 6–20)
CO2: 25 mmol/L (ref 22–32)
Calcium: 9.2 mg/dL (ref 8.9–10.3)
Chloride: 96 mmol/L — ABNORMAL LOW (ref 98–111)
Creatinine, Ser: 8.87 mg/dL — ABNORMAL HIGH (ref 0.44–1.00)
GFR, Estimated: 5 mL/min — ABNORMAL LOW (ref >60)
Glucose, Bld: 100 mg/dL — ABNORMAL HIGH (ref 70–99)
Phosphorus: 1.5 mg/dL — ABNORMAL LOW (ref 2.5–4.6)
Potassium: 5.2 mmol/L — ABNORMAL HIGH (ref 3.5–5.1)
Sodium: 135 mmol/L (ref 135–145)

## 2022-01-06 LAB — RESP PANEL BY RT-PCR (FLU A&B, COVID) ARPGX2
Influenza A by PCR: NEGATIVE
Influenza B by PCR: NEGATIVE
SARS Coronavirus 2 by RT PCR: NEGATIVE

## 2022-01-06 LAB — ECHOCARDIOGRAM COMPLETE
Area-P 1/2: 6.32 cm2
Calc EF: 11.5 %
Height: 67 in
S' Lateral: 4.62 cm
Single Plane A2C EF: 17.3 %
Single Plane A4C EF: 7.5 %
Weight: 1824 oz

## 2022-01-06 LAB — CBG MONITORING, ED: Glucose-Capillary: 94 mg/dL (ref 70–99)

## 2022-01-06 LAB — HEPATITIS B SURFACE ANTIBODY,QUALITATIVE: Hep B S Ab: REACTIVE — AB

## 2022-01-06 LAB — PHOSPHORUS: Phosphorus: 2.8 mg/dL (ref 2.5–4.6)

## 2022-01-06 LAB — HEPATITIS B SURFACE ANTIGEN: Hepatitis B Surface Ag: NONREACTIVE

## 2022-01-06 LAB — LIPASE, BLOOD: Lipase: 42 U/L (ref 11–51)

## 2022-01-06 LAB — HCG, SERUM, QUALITATIVE: Preg, Serum: NEGATIVE

## 2022-01-06 MED ORDER — HYDRALAZINE HCL 25 MG PO TABS
25.0000 mg | ORAL_TABLET | Freq: Three times a day (TID) | ORAL | Status: DC
  Administered 2022-01-06 (×2): 25 mg via ORAL
  Filled 2022-01-06 (×2): qty 1

## 2022-01-06 MED ORDER — PREDNISONE 20 MG PO TABS
40.0000 mg | ORAL_TABLET | Freq: Every day | ORAL | Status: DC
  Administered 2022-01-07 – 2022-01-09 (×3): 40 mg via ORAL
  Filled 2022-01-06 (×3): qty 2

## 2022-01-06 MED ORDER — HEPARIN SODIUM (PORCINE) 5000 UNIT/ML IJ SOLN
5000.0000 [IU] | Freq: Three times a day (TID) | INTRAMUSCULAR | Status: DC
  Administered 2022-01-06 – 2022-01-07 (×4): 5000 [IU] via SUBCUTANEOUS
  Filled 2022-01-06 (×4): qty 1

## 2022-01-06 MED ORDER — ACETAMINOPHEN 325 MG PO TABS
650.0000 mg | ORAL_TABLET | Freq: Four times a day (QID) | ORAL | Status: DC | PRN
  Administered 2022-01-06 – 2022-01-09 (×5): 650 mg via ORAL
  Filled 2022-01-06 (×5): qty 2

## 2022-01-06 MED ORDER — ONDANSETRON HCL 4 MG/2ML IJ SOLN: 4.0000 mg | Freq: Four times a day (QID) | INTRAMUSCULAR | Status: DC | PRN

## 2022-01-06 MED ORDER — HYDRALAZINE HCL 20 MG/ML IJ SOLN: 10.0000 mg | INTRAMUSCULAR | Status: DC | PRN

## 2022-01-06 MED ORDER — COLCHICINE 0.6 MG PO TABS: 0.3000 mg | ORAL_TABLET | Freq: Every day | ORAL | Status: DC

## 2022-01-06 MED ORDER — OXYCODONE HCL 5 MG PO TABS
5.0000 mg | ORAL_TABLET | ORAL | Status: DC | PRN
  Administered 2022-01-06 – 2022-01-08 (×8): 5 mg via ORAL
  Filled 2022-01-06 (×8): qty 1

## 2022-01-06 MED ORDER — CALCIUM ACETATE (PHOS BINDER) 667 MG PO CAPS
1334.0000 mg | ORAL_CAPSULE | Freq: Three times a day (TID) | ORAL | Status: DC
  Administered 2022-01-06: 1334 mg via ORAL
  Filled 2022-01-06: qty 2

## 2022-01-06 MED ORDER — TORSEMIDE 20 MG PO TABS
20.0000 mg | ORAL_TABLET | Freq: Two times a day (BID) | ORAL | Status: DC
  Administered 2022-01-06 – 2022-01-09 (×6): 20 mg via ORAL
  Filled 2022-01-06 (×6): qty 1

## 2022-01-06 MED ORDER — CARVEDILOL 3.125 MG PO TABS: 12.5000 mg | ORAL_TABLET | Freq: Two times a day (BID) | ORAL | Status: DC

## 2022-01-06 MED ORDER — ONDANSETRON HCL 4 MG PO TABS: 4.0000 mg | ORAL_TABLET | Freq: Four times a day (QID) | ORAL | Status: DC | PRN

## 2022-01-06 MED ORDER — HYDROMORPHONE HCL 1 MG/ML IJ SOLN
1.0000 mg | Freq: Once | INTRAMUSCULAR | Status: AC
Start: 2022-01-06 — End: 2022-01-06
  Administered 2022-01-06: 1 mg via INTRAVENOUS
  Filled 2022-01-06: qty 1

## 2022-01-06 MED ORDER — CARVEDILOL 12.5 MG PO TABS
12.5000 mg | ORAL_TABLET | Freq: Two times a day (BID) | ORAL | Status: DC
  Administered 2022-01-06 – 2022-01-09 (×6): 12.5 mg via ORAL
  Filled 2022-01-06: qty 1
  Filled 2022-01-06: qty 4
  Filled 2022-01-06 (×3): qty 1
  Filled 2022-01-06: qty 4

## 2022-01-06 MED ORDER — COLCHICINE 0.3 MG HALF TABLET
0.3000 mg | ORAL_TABLET | Freq: Two times a day (BID) | ORAL | Status: DC
  Administered 2022-01-06 (×2): 0.3 mg via ORAL
  Filled 2022-01-06 (×4): qty 1

## 2022-01-06 MED ORDER — CLONIDINE HCL 0.2 MG PO TABS
0.3000 mg | ORAL_TABLET | Freq: Three times a day (TID) | ORAL | Status: DC
  Administered 2022-01-06 – 2022-01-09 (×9): 0.3 mg via ORAL
  Filled 2022-01-06 (×9): qty 1

## 2022-01-06 MED ORDER — FENTANYL CITRATE PF 50 MCG/ML IJ SOSY
25.0000 ug | PREFILLED_SYRINGE | INTRAMUSCULAR | Status: DC | PRN
  Administered 2022-01-07 – 2022-01-09 (×9): 25 ug via INTRAVENOUS
  Filled 2022-01-06 (×11): qty 1

## 2022-01-06 MED ORDER — HYDRALAZINE HCL 20 MG/ML IJ SOLN
10.0000 mg | INTRAMUSCULAR | Status: DC | PRN
  Administered 2022-01-07: 20 mg via INTRAVENOUS
  Filled 2022-01-06: qty 1

## 2022-01-06 MED ORDER — CALCIUM ACETATE (PHOS BINDER) 667 MG/5ML PO SOLN
1334.0000 mg | Freq: Three times a day (TID) | ORAL | Status: DC
  Filled 2022-01-06: qty 10

## 2022-01-06 MED ORDER — ACETAMINOPHEN 650 MG RE SUPP: 650.0000 mg | Freq: Four times a day (QID) | RECTAL | Status: DC | PRN

## 2022-01-06 MED ORDER — HYDROXYCHLOROQUINE SULFATE 200 MG PO TABS
100.0000 mg | ORAL_TABLET | ORAL | Status: DC
  Administered 2022-01-06 – 2022-01-08 (×2): 100 mg via ORAL
  Filled 2022-01-06: qty 1
  Filled 2022-01-06: qty 0.5
  Filled 2022-01-06: qty 1

## 2022-01-06 MED ORDER — HYDRALAZINE HCL 20 MG/ML IJ SOLN: 10.0000 mg | Freq: Once | INTRAMUSCULAR | Status: DC

## 2022-01-06 MED ORDER — AMLODIPINE BESYLATE 10 MG PO TABS
10.0000 mg | ORAL_TABLET | Freq: Every day | ORAL | Status: DC
  Administered 2022-01-07 – 2022-01-09 (×3): 10 mg via ORAL
  Filled 2022-01-06 (×3): qty 1

## 2022-01-06 MED ORDER — HYDRALAZINE HCL 50 MG PO TABS
50.0000 mg | ORAL_TABLET | Freq: Three times a day (TID) | ORAL | Status: DC
  Administered 2022-01-06 – 2022-01-09 (×7): 50 mg via ORAL
  Filled 2022-01-06 (×7): qty 1

## 2022-01-06 MED ORDER — LABETALOL HCL 5 MG/ML IV SOLN
20.0000 mg | Freq: Once | INTRAVENOUS | Status: AC
  Administered 2022-01-06: 20 mg via INTRAVENOUS
  Filled 2022-01-06: qty 4

## 2022-01-06 MED ORDER — NICOTINE 21 MG/24HR TD PT24
21.0000 mg | MEDICATED_PATCH | Freq: Every day | TRANSDERMAL | Status: DC
  Filled 2022-01-06 (×2): qty 1

## 2022-01-06 NOTE — Assessment & Plan Note (Addendum)
Appears euvolemic.  Volume management as per dialysis. Echo has shown EF of 20 to 25%, global hypokinesis, grade 3 diastolic dysfunction.  On Coreg, torsemide, hydralazine.  She needs to follow-up with cardiology as an outpatient.

## 2022-01-06 NOTE — Assessment & Plan Note (Addendum)
On however several hours admissions, she has been recommended to follow-up with rheumatology as an outpatient.  She used to follow with Dr. Benjamine Mola.  Continue Plaquenil, prednisone

## 2022-01-06 NOTE — H&P (Signed)
History and Physical    Cathy [name] DVV:[id number] DOB: 1981-08-13 DOA: 01/05/2022  DOS: the patient was seen and examined on 01/05/2022  PCP: Pcp, No   Patient coming from: Home  I have personally briefly reviewed patient's old medical records in [place]  CC: CP, missed HD yesterday HPI: 41 year old African-American female with a history of SLE, end-stage renal disease on hemodialysis Tuesday Thursday Saturday, history of noncompliance, history of pericarditis, history of combined systolic/diastolic heart failure presents to the ER with shortness of breath and chest pain.  Patient missed hemodialysis yesterday.  Patient gives an excuse of an argument with her roommate.  Patient was discharged last month after having right middle lobe pneumonia along with exacerbation of her pericarditis.  Patient has not been taking her colchicine as prescribed.  She did not even know what colchicine was.  Questionable if patient has been taking any of her meds at home.  On arrival to the ER, temp 98.5 blood pressure 188/31 satting 100% on room air.  Labs significant for sodium 134 potassium 6.0 bicarb 23 BUN of 80 creatinine 8.87.  EKG showed normal sinus rhythm with hyperacute T waves.  Nephrology was consulted and patient went to hemodialysis for emergent dialysis due to hyperkalemia with hyper peaked T waves.  Patient was also persistently hypertensive.  Patient started on nitroglycerin drip for malignant hypertension.  [facility] hospitalist contacted for admission.   ED Course: hyperkalemia, hyperpeaked [place]. Started NTG gtts due to HTN  Review of Systems:  Review of Systems  Constitutional:  Positive for malaise/fatigue. Negative for chills and fever.  HENT: Negative.    Eyes: Negative.   Respiratory:  Positive for shortness of breath.   Cardiovascular:  Positive for chest pain.  Gastrointestinal: Negative.   Genitourinary: Negative.   Musculoskeletal:  Positive for back pain.   Skin:  Positive for itching.  Neurological:  Positive for weakness.  Endo/Heme/Allergies: Negative.   Psychiatric/Behavioral:  Positive for substance abuse.   All other systems reviewed and are negative.  Past Medical History:  Diagnosis Date   Anasarca associated with disorder of kidney 06/08/2021   Anxiety    Asthma    Bipolar depression ([place])    Depression    Dyspnea    ESRD (end stage renal disease) on dialysis ([place]) 10/2020   GERD (gastroesophageal reflux disease)    Gonorrhea    Lupus (HCC)    Pelvic inflammatory disease (PID)    Pleural effusion 06/08/2021   Renal hypertension    Trichomonas infection     Past Surgical History:  Procedure Laterality Date   IR FLUORO GUIDE CV LINE RIGHT  11/30/2020   IR FLUORO GUIDE CV LINE RIGHT  06/13/2021   IR REMOVAL TUN CV CATH W/O FL  06/11/2021   IR US GUIDE VASC ACCESS RIGHT  11/30/2020   IR US GUIDE VASC ACCESS RIGHT  06/13/2021   RENAL BIOPSY     TEE WITHOUT CARDIOVERSION N/A 06/13/2021   Procedure: TRANSESOPHAGEAL ECHOCARDIOGRAM (TEE);  Surgeon: Josue Hector, MD;  Location: [place];  Service: Cardiovascular;  Laterality: N/A;   TUBAL LIGATION N/A 02/03/2019   Procedure: POST PARTUM TUBAL LIGATION;  Surgeon: Aletha Halim, MD;  Location: [facility];  Service: Gynecology;  Laterality: N/A;     reports that she quit smoking about 14 months ago. Her smoking use included cigarettes. She smoked an average of .25 packs per day. She has never used smokeless tobacco. She reports that she does not currently use  alcohol. She reports that she does not currently use drugs after having used the following drugs: Marijuana.  No Known Allergies  Family History  Problem Relation Age of Onset   Diabetes Maternal Grandmother    Hypertension Maternal Grandmother    Diabetes Maternal Grandfather    Hypertension Maternal Grandfather    Diabetes Paternal Grandmother    Hypertension Paternal Grandmother    Diabetes Paternal Grandfather     Hypertension Paternal Grandfather     Prior to Admission medications   Medication Sig Start Date End Date Taking? Authorizing Provider  acetaminophen (TYLENOL) 500 MG tablet Take 1,000 mg by mouth every 6 (six) hours as needed for moderate pain.    [provider]  albuterol (VENTOLIN HFA) 108 (90 Base) MCG/ACT inhaler Inhale 2 puffs into the lungs every 6 (six) hours as needed for wheezing or shortness of breath. 12/17/21 12/17/22  Rai, Ripudeep K, MD  amLODipine (NORVASC) 10 MG tablet Take 1 tablet (10 mg total) by mouth daily. 12/17/21   Rai, Vernelle Emerald, MD  carvedilol (COREG) 12.5 MG tablet Take 1 tablet (12.5 mg total) by mouth 2 (two) times daily with a meal. 12/17/21   Rai, Ripudeep K, MD  cloNIDine (CATAPRES) 0.3 MG tablet Take 0.3 mg by mouth 3 (three) times daily.    [provider]  colchicine 0.6 MG tablet Take 0.5 tablets (0.3 mg total) by mouth daily. 12/31/21   Arrien, Jimmy Picket, MD  hydrALAZINE (APRESOLINE) 25 MG tablet Take 1 tablet (25 mg total) by mouth 3 (three) times daily. 12/17/21   Rai, Vernelle Emerald, MD  hydroxychloroquine (PLAQUENIL) 200 MG tablet Take 0.5 tablets (100 mg total) by mouth every other day. Take one table after hemodialysis 12/31/21   Arrien, Jimmy Picket, MD  hydrOXYzine (ATARAX) 25 MG tablet Take 1 tablet (25 mg total) by mouth 3 (three) times daily as needed for itching or anxiety. 12/17/21   Rai, Vernelle Emerald, MD  ipratropium (ATROVENT HFA) 17 MCG/ACT inhaler Inhale 2 puffs into the lungs every 6 (six) hours as needed for wheezing. 12/17/21 12/17/22  Rai, Vernelle Emerald, MD  methocarbamol (ROBAXIN) 500 MG tablet Take 1 tablet (500 mg total) by mouth every 6 (six) hours as needed for muscle spasms. 12/17/21   Rai, Vernelle Emerald, MD  Nutritional Supplements (,FEEDING SUPPLEMENT, PROSOURCE PLUS) liquid Take 30 mLs by mouth 3 (three) times daily between meals. Patient not taking: Reported on 12/14/2021 06/14/21   Edwin Dada, MD  oxyCODONE  (OXY IR/ROXICODONE) 5 MG immediate release tablet Take 1 tablet (5 mg total) by mouth every 6 (six) hours as needed for severe pain. 12/30/21   Arrien, Jimmy Picket, MD  pantoprazole (PROTONIX) 40 MG tablet Take 40 mg by mouth daily.    [provider]  predniSONE (DELTASONE) 20 MG tablet Take 2 tablets (40 mg total) by mouth daily with breakfast. 12/31/21 01/30/22  Arrien, Jimmy Picket, MD  sevelamer carbonate (RENVELA) 800 MG tablet Take 800 mg by mouth in the morning and at bedtime. 08/24/21   [provider]  torsemide (DEMADEX) 20 MG tablet Take 20 mg by mouth 2 (two) times daily.    [provider]  fenofibrate 160 MG tablet Take 1 tablet (160 mg total) by mouth daily. 12/31/20 01/20/21  Elmarie Shiley, MD    Physical Exam: Vitals:   01/06/22 0159 01/06/22 0229 01/06/22 0259 01/06/22 0415  BP: (!) 193/133 (!) 187/132  (!) 183/131  Pulse: 75 78  88  Resp: Marland Kitchen)  22 18  (!) 21  Temp:   98.2 F (36.8 C)   TempSrc:   Oral   SpO2: 100% 100%  100%  Weight:      Height:        Physical Exam Vitals and nursing note reviewed.  Constitutional:      Comments: Chronically ill-appearing  HENT:     Head: Normocephalic and atraumatic.     Nose: Nose normal. No rhinorrhea.  Cardiovascular:     Rate and Rhythm: Normal rate and regular rhythm.     Heart sounds:    Friction rub present.  Pulmonary:     Effort: Pulmonary effort is normal. No respiratory distress.     Breath sounds: Normal breath sounds. No wheezing or rales.  Abdominal:     General: Abdomen is flat. Bowel sounds are normal. There is no distension.     Tenderness: There is no abdominal tenderness. There is no guarding.  Musculoskeletal:     Right lower leg: No edema.     Left lower leg: No edema.  Skin:    General: Skin is warm and dry.     Capillary Refill: Capillary refill takes less than 2 seconds.     Comments: Right anterior chest wall hemodialysis catheter  Neurological:     General:  No focal deficit present.     Mental Status: She is alert and oriented to person, place, and time.     Labs on Admission: I have personally reviewed following labs and imaging studies  CBC: Recent Labs  Lab 01/05/22 0005 01/05/22 1953 01/05/22 2000  WBC 11.7* 11.6*  --   HGB 8.6* 9.1* 9.5*  HCT 27.7* 29.8* 28.0*  MCV 88.8 90.0  --   PLT 416* 378  --    Basic Metabolic Panel: Recent Labs  Lab 01/05/22 0006 01/05/22 1953 01/05/22 2000  NA 135 134* 132*  K 5.2* 6.0* 6.0*  CL 96* 97*  --   CO2 25 23  --   GLUCOSE 100* 109*  --   BUN 83* 80*  --   CREATININE 8.87* 8.71*  --   CALCIUM 9.2 8.9  --   PHOS 1.5*  --   --    GFR: Estimated Creatinine Clearance: 7.1 mL/min (A) (by C-G formula based on SCr of 8.71 mg/dL (H)). Liver Function Tests: Recent Labs  Lab 01/05/22 0006  ALBUMIN 2.4*   No results for input(s): LIPASE, AMYLASE in the last 168 hours. No results for input(s): AMMONIA in the last 168 hours. Coagulation Profile: No results for input(s): INR, PROTIME in the last 168 hours. Cardiac Enzymes: No results for input(s): CKTOTAL, CKMB, CKMBINDEX, TROPONINI in the last 168 hours. BNP (last 3 results) No results for input(s): PROBNP in the last 8760 hours. HbA1C: No results for input(s): HGBA1C in the last 72 hours. CBG: Recent Labs  Lab 01/05/22 2134  GLUCAP 220*   Lipid Profile: No results for input(s): CHOL, HDL, LDLCALC, TRIG, CHOLHDL, LDLDIRECT in the last 72 hours. Thyroid Function Tests: No results for input(s): TSH, T4TOTAL, FREET4, T3FREE, THYROIDAB in the last 72 hours. Anemia Panel: No results for input(s): VITAMINB12, FOLATE, FERRITIN, TIBC, IRON, RETICCTPCT in the last 72 hours. Urine analysis:    Component Value Date/Time   COLORURINE YELLOW 11/29/2020 2054   APPEARANCEUR HAZY (A) 11/29/2020 2054   LABSPEC 1.015 11/29/2020 2054   PHURINE 6.0 11/29/2020 2054   GLUCOSEU NEGATIVE 11/29/2020 2054   HGBUR MODERATE (A) 11/29/2020 2054    BILIRUBINUR  NEGATIVE 11/29/2020 2054   [place] 11/29/2020 2054   PROTEINUR >=300 (A) 11/29/2020 2054   UROBILINOGEN 0.2 10/17/2020 1026   NITRITE NEGATIVE 11/29/2020 2054   LEUKOCYTESUR NEGATIVE 11/29/2020 2054    Radiological Exams on Admission: I have personally reviewed images DG Chest Port 1 View  Result Date: 01/05/2022 CLINICAL DATA:  Dyspnea, missed dialysis EXAM: PORTABLE CHEST 1 VIEW COMPARISON:  12/26/2021 FINDINGS: Moderate to severe cardiomegaly is stable. Pulmonary vascularity is normal. Small bilateral pleural effusions are present, left greater than right. No pneumothorax. No confluent pulmonary infiltrate. Right internal jugular hemodialysis catheter tip noted within the right atrium. No acute bone abnormality. IMPRESSION: Stable cardiomegaly. Small bilateral pleural effusions, left greater than right. Electronically Signed   By: Fidela Salisbury M.D.   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 Continue NTG gtts. Give 1 dose IV labetalol. May need labetalol gtts vs cardene gtts.  Systemic lupus erythematosus (SLE) with pericarditis ([place])- (present on admission) Pt not taking colchicine that she was Rx at discharge. Pt doesn't even know what colchicine was for. May need 2-4 weeks of tapered prednisone. Repeat echo to evaluate pericardial effusion.  Hyperkalemia- (present on admission) S/p emergent HD tonight.  Non compliance w medication regimen Given another excuse why she  didn't go to HD yesterday. Pt with hx of noncompliance with meds and HD.  Chronic combined systolic and diastolic congestive heart failure ([place])- (present on admission) Appears euvolemic.  ESRD on hemodialysis [facility]) Received emergent HD tonight due to hyperkalemia.  Pericardial effusion- (present on admission) Repeat echo. If continues or gets bigger, ??need for pericardial drainage vs pericardial window.  Renal hypertension- (present on admission) Chronic. Doesn't take her meds.    DVT prophylaxis: SQ Heparin Code Status: Full Code Family Communication: no family at bedside  Disposition Plan: return home  Consults called: nephrology  Admission status: Observation,  progressive   Kristopher Oppenheim, DO [facility] 01/06/2022, 4:44 AM

## 2022-01-06 NOTE — ED Provider Notes (Signed)
I assumed care of this patient.  Please see previous provider note for further details of Hx, PE.  Briefly patient is a 41 y.o. female with a history of SLE, ESRD on HD Tuesday, Thursday, Saturday, diastolic CHF, HTN, chronic respiratory failure, chronic anemia, bipolar disorder who was recently admitted for pericarditis presented to the emergency department with dyspnea and chest pain in the setting of missed dialysis.  Patient was initially noted to be hyperkalemic and hypertensive.  Chest x-ray with evidence of pulmonary edema.  She required emergency dialysis.  Initially patient had CTA ordered that was discontinued after discussion between the previous provider and hospitalist. Plan was to reassess patient upon arrival to determine admission versus DC home.   3:50 AM Patient return to the emergency department. She is still in significant pain which she reports is related to her pericarditis just more severe. Patient is on nitro drip with blood pressure still 180/130s.  I ordered hydralazine for additional blood pressure control. Ordered IV Dilaudid for pain.    4:22 AM I spoke with Dr. Bridgett Larsson from the hospitalist service who will admit the patient for continued management    .Critical Care Performed by: Fatima Blank, MD Authorized by: Fatima Blank, MD   Critical care provider statement:    Critical care time (minutes):  30   Critical care time was exclusive of:  Separately billable procedures and treating other patients   Critical care was necessary to treat or prevent imminent or life-threatening deterioration of the following conditions:  Circulatory failure (HTN emergency)   Critical care was time spent personally by me on the following activities:  Development of treatment plan with patient or surrogate, discussions with consultants, evaluation of patient's response to treatment, examination of patient, obtaining history from patient or surrogate, review of old  charts, re-evaluation of patient's condition, pulse oximetry, ordering and review of radiographic studies, ordering and review of laboratory studies and ordering and performing treatments and interventions   Care discussed with: admitting provider       Fatima Blank, MD 01/06/22 0422

## 2022-01-06 NOTE — Assessment & Plan Note (Signed)
S/p emergent HD tonight.

## 2022-01-06 NOTE — Progress Notes (Signed)
Cathy [name] is alert and oriented x4. [name] ambulated to bed with no assistance, gait steady with nonslip socks. Vital signs completed. Oriented to room, call light and bed controls.

## 2022-01-06 NOTE — Assessment & Plan Note (Addendum)
Received emergent HD tonight due to hyperkalemia. Nephrology following and undergoing dialysis.  Hyperkalemia has resolved.

## 2022-01-06 NOTE — Subjective & Objective (Signed)
CC: CP, missed HD yesterday HPI: 41 year old African-American female with a history of SLE, end-stage renal disease on hemodialysis Tuesday Thursday Saturday, history of noncompliance, history of pericarditis, history of combined systolic/diastolic heart failure presents to the ER with shortness of breath and chest pain.  Patient missed hemodialysis yesterday.  Patient gives an excuse of an argument with her roommate.  Patient was discharged last month after having right middle lobe pneumonia along with exacerbation of her pericarditis.  Patient has not been taking her colchicine as prescribed.  She did not even know what colchicine was.  Questionable if patient has been taking any of her meds at home.  On arrival to the ER, temp 98.5 blood pressure 188/31 satting 100% on room air.  Labs significant for sodium 134 potassium 6.0 bicarb 23 BUN of 80 creatinine 8.87.  EKG showed normal sinus rhythm with hyperacute T waves.  Nephrology was consulted and patient went to hemodialysis for emergent dialysis due to hyperkalemia with hyper peaked T waves.  Patient was also persistently hypertensive.  Patient started on nitroglycerin drip for malignant hypertension.  [facility] hospitalist contacted for admission.

## 2022-01-06 NOTE — Assessment & Plan Note (Addendum)
Chronic. Doesn't take her meds.  Continue current medications

## 2022-01-06 NOTE — ED Notes (Signed)
Breakfast Orders Placed °

## 2022-01-06 NOTE — Assessment & Plan Note (Addendum)
Pt with hx of noncompliance with meds and HD.

## 2022-01-06 NOTE — Assessment & Plan Note (Addendum)
Moderate to large pleural effusion is seen on the CT imaging.  This is a chronic finding.  Likely from uncontrolled lupus.  Echo was repeated here and did not show significant change from imaging no evidence of tamponade.  Plan is to continue monitoring.

## 2022-01-06 NOTE — Assessment & Plan Note (Addendum)
Initially started on NTG gtts. Drip has been stopped now.  She was on Coreg, clonidine, hydralazine, amlodipine at home.  Restarted this medications with improvement in the blood pressure.

## 2022-01-06 NOTE — Progress Notes (Signed)
Patient admitted to the hospital early this morning by Dr. Bridgett Larsson  Patient seen and examined.  She presents to the hospital with complaints of chest pain, abdominal pain and shortness of breath.  Her chest pain felt as though it was radiating to between her shoulder blades.  She had a presentation last month where she was found to have pericarditis and pleural effusion.  She also had right middle lobe pneumonia.  She felt as though her symptoms were similar to as they were to her prior presentation.  At the time of her last discharge, she did feel significantly better.  Her symptoms started approximately 3 days ago and have gotten worse.  Of note, she missed her last dialysis session.  On arrival, she is noted to be markedly hypertensive.  She reports being compliant with her medications including clonidine.  He also reports taking colchicine and prednisone.  She was [facility] emergency room where she was noted to be hyperkalemic.  She underwent urgent dialysis.  Overall, she feels that her shortness of breath may be mildly better.  Her blood pressure remained elevated and she was started on nitroglycerin infusion.  Her home medications were restarted.  Due to chest pain rating through to her back and elevated blood pressures, CT angiogram of the chest has been ordered to rule out dissection.  This was also discussed with nephrology who felt it was reasonable to give the patient.  The patient says that she normally does make urine, but has not made significant urine in the past 3 days.  Checking bladder scan.  Echocardiogram was rechecked today and no significant change in pericardial effusion was noted.  She is continued on colchicine and prednisone.  Currently, she does not have any signs of persistent infection.  We will try to wean off nitroglycerin as tolerated.  [employer]

## 2022-01-06 NOTE — Consult Note (Addendum)
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 On arrival to ED BP 188/31  ED labs significant tof K 6.1 BUN 80 Cr 8.87. ECG with hyperacute T waves  She was started on NTG gtt and admitted to [facility] Nephrology was also consulted  On 2/6 patient c/o worsening SOB and chest pain radiating between shoulder blades. She shared with 2/6 TRH team these sx began 3 days ago. Her home BP meds were re-ordered and ECHO without a significant change in pericardial effusion. A CTA c/a/p was also ordered to r/o dissection but this has not yet been completed   PCCM is consulted 2/6 night for persistent Hypertension. At time of consultation BP 163/123 on 120mcg/min NTG   Pertinent  Medical History  SLE ESRD Pericardial effusion HTN Systolic and diastolic HF  Significant Hospital Events: Including procedures, antibiotic start and stop dates in addition to other pertinent events   2/5 presented to ED. Started on NTG. Admit to TRH 2/6 home BP meds restarted. Remains on NTG. Nephro consulted. PCCM consulted   Interim History / Subjective:   Remains on NTG gtt   Objective   Blood pressure (!) 163/123, pulse 75, temperature 98.7 F (37.1 C), resp. rate 18, height 5\' 7"  (1.702 m), weight 51.7 kg, SpO2 93 %, unknown if currently breastfeeding.        Intake/Output Summary (Last 24 hours) at 01/06/2022 2218 Last data filed at 01/06/2022 1502 Gross per 24 hour  Intake 308.33 ml  Output 4000 ml  Net -3691.67 ml   Filed Weights   01/05/22 1858  Weight: 51.7 kg    Examination: General: acutely ill appearing adult F seated  in BED NAD  HENT: NCAT pink mm [place] hanging off of ear  Lungs: CTAb even unlabored on RA  Cardiovascular: rr. S1s2. + friction rub. Cap refill brisk  Abdomen: soft ndnt  Extremities: no acute deformity no cyanosis or clubbing  Neuro: Slightly lethargic. Oriented x3 following commands no focal deficit GU: defer Skin: c/d/w. HD catheter dressing c/d/i  Resolved Hospital Problem list     Assessment & Plan:   HA -suspect related to NTG. Onset nearly immediately after ntg started. No focal deficits  P -adding PRN fent to PRN oxy  -will add EtCO2   Hypertension, poorly controlled  Chest pain, improved  -home clonidine 0.3 mg TID, demadex 20mg  BID, coreg 12.5 mg BID, norvasc 10mg  qD, Hydral 25mg  TID  -currently on NTG gtt + coreg 12.5, clonidine 0.3mg  TID, hydral 50mg  TID, demadex 20mg  BID + PRN hydral 10mg  q4  -in talking to pt it sounds like home SBPs are often 160-180. It seems historically better control has been achievable inpatient  P -if unable to wean off ntg will move to ICU  -goal SBP 150-170 -wean ntg gtt as able -will add home norvasc  -incr PRN hydral to 10-20mg   -cont clonidine, demadex, [place] hydral, coreg  -awaiting CTA c/a/p   Pericardial effusion  -colchicine   Combined systolic and diastolic HF -volume management via HD  ESRD  Hyperkalemia  - HD per nephro  SLE -cont pred and plaquenil  Pericardial  effusion  -colchicine   Best Practice (right click and "Reselect all SmartList Selections" daily)   Diet/type: Regular consistency (see orders) DVT prophylaxis: prophylactic heparin  GI prophylaxis: PPI -- given chronic steroids  Lines: Dialysis Catheter Foley:  N/A Code Status:  full code Last date of multidisciplinary goals of care discussion [pt updated 2/6]  Labs   CBC: Recent Labs  Lab 01/05/22 0005 01/05/22 1953 01/05/22 2000  WBC 11.7* 11.6*  --   HGB 8.6* 9.1* 9.5*  HCT 27.7* 29.8* 28.0*  MCV 88.8 90.0  --   PLT 416* 378  --      Basic Metabolic Panel: Recent Labs  Lab 01/05/22 0006 01/05/22 1953 01/05/22 2000  NA 135 134* 132*  K 5.2* 6.0* 6.0*  CL 96* 97*  --   CO2 25 23  --   GLUCOSE 100* 109*  --   BUN 83* 80*  --   CREATININE 8.87* 8.71*  --   CALCIUM 9.2 8.9  --   PHOS 1.5*  --   --    GFR: Estimated Creatinine Clearance: 7.1 mL/min (A) (by C-G formula based on SCr of 8.71 mg/dL (H)). Recent Labs  Lab 01/05/22 0005 01/05/22 1953  WBC 11.7* 11.6*    Liver Function Tests: Recent Labs  Lab 01/05/22 0006  ALBUMIN 2.4*   No results for input(s): LIPASE, AMYLASE in the last 168 hours. No results for input(s): AMMONIA in the last 168 hours.  ABG    Component Value Date/Time   PHART 7.335 (L) 01/18/2021 2111   PCO2ART 45.7 01/18/2021 2111   PO2ART 150 (H) 01/18/2021 2111   HCO3 26.3 01/05/2022 2000   TCO2 28 01/05/2022 2000   ACIDBASEDEF 1.0 01/18/2021 2111   O2SAT 63.0 01/05/2022 2000     Coagulation Profile: No results for input(s): INR, PROTIME in the last 168 hours.  Cardiac Enzymes: No results for input(s): CKTOTAL, CKMB, CKMBINDEX, TROPONINI in the last 168 hours.  HbA1C: No results found for: HGBA1C  CBG: Recent Labs  Lab 01/05/22 2134 01/06/22 1250  GLUCAP 220* 94    Review of Systems:   Review of Systems  Constitutional: Negative.   HENT: Negative.    Eyes: Negative.   Respiratory:  Positive for shortness of breath.   Cardiovascular: Negative.   Gastrointestinal: Negative.   Genitourinary: Negative.   Musculoskeletal: Negative.   Skin: Negative.   Neurological:  Positive for headaches.  Endo/Heme/Allergies: Negative.   Psychiatric/Behavioral: Negative.      Past Medical History:  She,  has a past medical history of Anasarca associated with disorder of kidney (06/08/2021), Anxiety, Asthma, Bipolar depression ([place]), Depression, Dyspnea, ESRD (end stage renal disease) on dialysis ([place]) (10/2020), GERD (gastroesophageal reflux disease), Gonorrhea, Lupus  ([place]), Pelvic inflammatory disease (PID), Pleural effusion (06/08/2021), Renal hypertension, and Trichomonas infection.   Surgical History:   Past Surgical History:  Procedure Laterality Date   IR FLUORO GUIDE CV LINE RIGHT  11/30/2020   IR FLUORO GUIDE CV LINE RIGHT  06/13/2021   IR REMOVAL TUN CV CATH W/O FL  06/11/2021   IR US GUIDE VASC ACCESS RIGHT  11/30/2020   IR US GUIDE VASC ACCESS RIGHT  06/13/2021   RENAL BIOPSY     TEE WITHOUT CARDIOVERSION N/A 06/13/2021   Procedure: TRANSESOPHAGEAL ECHOCARDIOGRAM (TEE);  Surgeon: Josue Hector, MD;  Location: [place];  Service: Cardiovascular;  Laterality: N/A;   TUBAL LIGATION N/A 02/03/2019   Procedure: POST PARTUM TUBAL LIGATION;  Surgeon: Aletha Halim, MD;  Location: [facility] LD [facility]S;  Service: Gynecology;  Laterality: N/A;     Social History:   reports that she quit smoking about 14 months ago. Her smoking use included cigarettes. She smoked an average of .25 packs per day. She has never used smokeless tobacco. She reports that she does not currently use alcohol. She reports that she does not currently use drugs after having used the following drugs: Marijuana.   Family History:  Her family history includes Diabetes in her maternal grandfather, maternal grandmother, paternal grandfather, and paternal grandmother; Hypertension in her maternal grandfather, maternal grandmother, paternal grandfather, and paternal grandmother.   Allergies No Active Allergies   Home Medications  Prior to Admission medications   Medication Sig Start Date End Date Taking? Authorizing Provider  acetaminophen (TYLENOL) 500 MG tablet Take 1,000 mg by mouth every 6 (six) hours as needed for moderate pain.   Yes [provider]  albuterol (VENTOLIN HFA) 108 (90 Base) MCG/ACT inhaler Inhale 2 puffs into the lungs every 6 (six) hours as needed for wheezing or shortness of breath. 12/17/21 12/17/22 Yes Rai, Ripudeep K, MD  amoxicillin-clavulanate (AUGMENTIN)  500-125 MG tablet Take 1 tablet by mouth See admin instructions. Bid x 5 days   Yes [provider]  calcium acetate, Phos Binder, (PHOSLYRA) 667 MG/5ML SOLN Take 1,334 mg by mouth 3 (three) times daily with meals.   Yes [provider]  cloNIDine (CATAPRES) 0.3 MG tablet Take 0.3 mg by mouth 3 (three) times daily.   Yes [provider]  colchicine 0.6 MG tablet Take 0.5 tablets (0.3 mg total) by mouth daily. 12/31/21  Yes Arrien, Jimmy Picket, MD  guaiFENesin (MUCINEX) 600 MG 12 hr tablet Take 600 mg by mouth 2 (two) times daily as needed for cough.   Yes [provider]  hydroxychloroquine (PLAQUENIL) 200 MG tablet Take 0.5 tablets (100 mg total) by mouth every other day. Take one table after hemodialysis 12/31/21  Yes Arrien, Jimmy Picket, MD  hydrOXYzine (ATARAX) 25 MG tablet Take 1 tablet (25 mg total) by mouth 3 (three) times daily as needed for itching or anxiety. 12/17/21  Yes Rai, Ripudeep K, MD  ipratropium (ATROVENT HFA) 17 MCG/ACT inhaler Inhale 2 puffs into the lungs every 6 (six) hours as needed for wheezing. 12/17/21 12/17/22 Yes Rai, Ripudeep K, MD  Menthol-Methyl Salicylate (ICY HOT) 42-68 % STCK Apply 1 application topically as needed (pain).   Yes [provider]  methocarbamol (ROBAXIN) 500 MG tablet Take 1 tablet (500 mg total) by mouth every 6 (six) hours as needed for muscle spasms. 12/17/21  Yes Rai, Ripudeep K, MD  oxyCODONE (OXY IR/ROXICODONE) 5 MG immediate release tablet Take 1 tablet (5 mg total) by mouth every 6 (six) hours as needed for severe pain. 12/30/21  Yes Arrien, Jimmy Picket, MD  predniSONE (DELTASONE) 20 MG tablet Take 2 tablets (40 mg total) by mouth daily with breakfast. 12/31/21 01/30/22 Yes Arrien, Jimmy Picket, MD  torsemide (DEMADEX) 20 MG tablet Take 20 mg by mouth 2 (two) times daily.   Yes [provider]  amLODipine (NORVASC) 10 MG tablet Take 1 tablet (10 mg total) by mouth daily. Patient not  taking: Reported on 01/06/2022 12/17/21   Rai, Vernelle Emerald, MD  carvedilol (COREG) 12.5 MG tablet Take 1 tablet (12.5 mg total) by mouth 2 (two) times daily with a meal. Patient not taking: Reported on 01/06/2022 12/17/21   Rai, Vernelle Emerald, MD  hydrALAZINE (APRESOLINE) 25 MG tablet Take 1 tablet (  25 mg total) by mouth 3 (three) times daily. Patient not taking: Reported on 01/06/2022 12/17/21   Rai, Vernelle Emerald, MD  Nutritional Supplements (,FEEDING SUPPLEMENT, PROSOURCE PLUS) liquid Take 30 mLs by mouth 3 (three) times daily between meals. Patient not taking: Reported on 01/06/2022 06/14/21   Edwin Dada, MD  fenofibrate 160 MG tablet Take 1 tablet (160 mg total) by mouth daily. 12/31/20 01/20/21  Regalado, Cassie Freer, MD     Critical care time: 40 minutes      CRITICAL CARE Performed by: Cristal Generous   Total critical care time: 40 minutes  Critical care time was exclusive of separately billable procedures and treating other patients.  Critical care was necessary to treat or prevent imminent or life-threatening deterioration.  Critical care was time spent personally by me on the following activities: development of treatment plan with patient and/or surrogate as well as nursing, discussions with consultants, evaluation of patient's response to treatment, examination of patient, obtaining history from patient or surrogate, ordering and performing treatments and interventions, ordering and review of laboratory studies, ordering and review of radiographic studies, pulse oximetry and re-evaluation of patient's condition.  Eliseo Gum MSN, AGACNP-BC [place] for pager  01/06/2022, 11:05 PM

## 2022-01-06 NOTE — Consult Note (Addendum)
[facility] Progress Note   Subjective:   Patient seen and examined at bedside in the ED.  Multiple complaints.  Reports chest pain and heaviness starting 3 days ago which worsened yesterday prompting her to come to the ED.  Additional symptoms starting about the same time include SOB, hot flashes/cold sweats, flatulence and constipation.  Breathing improved after completing dialysis.  Today she reports n/v after eating breakfast and lower back/flank pain which started while in dialysis.  Admits to dysuria and decreased urine output over the last week.  Missed dialysis on Saturday due to issues with her living situation and concern for her child who is now safe with her brother.  Currently having 9/10 pain and asking for pain medication.  Pertinent findings in ED include K 6.0, BUN 80, SCr 8 and CXR with stable cardiomegaly and small pleural effusions R>L.   Recent [facility] admit 12/26/21 - 12/30/21 due to right middle low PNA, acute pericarditis and suspected lupus flare.   Objective Vitals:   01/06/22 1500 01/06/22 1540 01/06/22 1545 01/06/22 1555  BP: (!) 160/116 (!) 167/131 (!) 156/121 (!) 160/126  Pulse: 73  72   Resp:   (!) 23   Temp:      TempSrc:      SpO2: 100%  93%   Weight:      Height:       Physical Exam General:chronically ill appearing female in NAD Heart:RRR, +pericardial rub, no murmur Lungs:breath sounds decreased in bases, no crackles Abdomen:soft, distended, +BS Extremities:no LE edema Dialysis Access: R IJ [facility]   Filed Weights   01/05/22 1858  Weight: 51.7 kg    Intake/Output Summary (Last 24 hours) at 01/06/2022 1559 Last data filed at 01/06/2022 1502 Gross per 24 hour  Intake 308.33 ml  Output 4000 ml  Net -3691.67 ml    Additional Objective Labs: Basic Metabolic Panel: Recent Labs  Lab 01/05/22 0006 01/05/22 1953 01/05/22 2000  NA 135 134* 132*  K 5.2* 6.0* 6.0*  CL 96* 97*  --   CO2 25 23  --   GLUCOSE 100* 109*  --   BUN 83* 80*  --    CREATININE 8.87* 8.71*  --   CALCIUM 9.2 8.9  --   PHOS 1.5*  --   --    Liver Function Tests: Recent Labs  Lab 01/05/22 0006  ALBUMIN 2.4*   CBC: Recent Labs  Lab 01/05/22 0005 01/05/22 1953 01/05/22 2000  WBC 11.7* 11.6*  --   HGB 8.6* 9.1* 9.5*  HCT 27.7* 29.8* 28.0*  MCV 88.8 90.0  --   PLT 416* 378  --     CBG: Recent Labs  Lab 01/05/22 2134 01/06/22 1250  GLUCAP 220* 94   Studies/Results: DG Chest Port 1 View  Result Date: 01/05/2022 CLINICAL DATA:  Dyspnea, missed dialysis EXAM: PORTABLE CHEST 1 VIEW COMPARISON:  12/26/2021 FINDINGS: Moderate to severe cardiomegaly is stable. Pulmonary vascularity is normal. Small bilateral pleural effusions are present, left greater than right. No pneumothorax. No confluent pulmonary infiltrate. Right internal jugular hemodialysis catheter tip noted within the right atrium. No acute bone abnormality. IMPRESSION: Stable cardiomegaly. Small bilateral pleural effusions, left greater than right. Electronically Signed   By: Fidela Salisbury M.D.   On: 01/05/2022 19:52   ECHOCARDIOGRAM COMPLETE  Result Date: 01/06/2022    ECHOCARDIOGRAM REPORT   Patient Name:   Cathy [name] Date of Exam: 01/06/2022 Medical Rec #:  [id number]  Height:       67.0 in Accession #:    [id number]       Weight:       114.0 lb Date of Birth:  Oct 06, 1981       BSA:          1.593 m Patient Age:    41 years         BP:           177/125 mmHg Patient Gender: F                HR:           86 bpm. Exam Location:  Inpatient Procedure: 2D Echo, Cardiac Doppler and Color Doppler Indications:    pericardial effusion  History:        Patient has prior history of Echocardiogram examinations, most                 recent 12/27/2021. Risk Factors:Hypertension. ESRD.  Sonographer:    Melissa Morford RDCS (AE, PE) Referring Phys: [place]  1. Left ventricular ejection fraction, by estimation, is 20 to 25%. The left ventricle has severely decreased function. The  left ventricle demonstrates global hypokinesis. Left ventricular diastolic parameters are consistent with Grade III diastolic dysfunction (restrictive).  2. Right ventricular systolic function is normal. The right ventricular size is normal.  3. Left atrial size was mildly dilated.  4. Right atrial size was mildly dilated.  5. Moderate pericardial effusion. The pericardial effusion is circumferential. There is no evidence of cardiac tamponade.  6. The mitral valve is normal in structure. Mild mitral valve regurgitation. No evidence of mitral stenosis.  7. Tricuspid valve regurgitation is moderate.  8. The aortic valve is normal in structure. Aortic valve regurgitation is mild. No aortic stenosis is present.  9. Pulmonic valve regurgitation is moderate. 10. The inferior vena cava is normal in size with greater than 50% respiratory variability, suggesting right atrial pressure of 3 mmHg. Comparison(s): No significant change from prior study. Prior images reviewed side by side. FINDINGS  Left Ventricle: Left ventricular ejection fraction, by estimation, is 20 to 25%. The left ventricle has severely decreased function. The left ventricle demonstrates global hypokinesis. The left ventricular internal cavity size was normal in size. There is no left ventricular hypertrophy. Left ventricular diastolic parameters are consistent with Grade III diastolic dysfunction (restrictive). Right Ventricle: The right ventricular size is normal. No increase in right ventricular wall thickness. Right ventricular systolic function is normal. Left Atrium: Left atrial size was mildly dilated. Right Atrium: Right atrial size was mildly dilated. Pericardium: A moderately sized pericardial effusion is present. The pericardial effusion is circumferential. There is no evidence of cardiac tamponade. Mitral Valve: The mitral valve is normal in structure. Mild mitral valve regurgitation. No evidence of mitral valve stenosis. Tricuspid Valve: The  tricuspid valve is normal in structure. Tricuspid valve regurgitation is moderate . No evidence of tricuspid stenosis. Aortic Valve: The aortic valve is normal in structure. Aortic valve regurgitation is mild. No aortic stenosis is present. Pulmonic Valve: The pulmonic valve was normal in structure. Pulmonic valve regurgitation is moderate. No evidence of pulmonic stenosis. Aorta: The aortic root is normal in size and structure. Venous: The inferior vena cava is normal in size with greater than 50% respiratory variability, suggesting right atrial pressure of 3 mmHg. IAS/Shunts: No atrial level shunt detected by color flow Doppler.  LEFT VENTRICLE PLAX 2D LVIDd:  5.17 cm      Diastology LVIDs:         4.62 cm      LV e' medial:    5.77 cm/s LV PW:         1.11 cm      LV E/e' medial:  21.8 LV IVS:        1.16 cm      LV e' lateral:   5.93 cm/s LVOT diam:     2.00 cm      LV E/e' lateral: 21.2 LV SV:         57 LV SV Index:   36 LVOT Area:     3.14 cm  LV Volumes (MOD) LV vol d, MOD A2C: 133.0 ml LV vol d, MOD A4C: 104.5 ml LV vol s, MOD A2C: 110.0 ml LV vol s, MOD A4C: 96.6 ml LV SV MOD A2C:     23.0 ml LV SV MOD A4C:     104.5 ml LV SV MOD BP:      15.0 ml RIGHT VENTRICLE TAPSE (M-mode): 1.6 cm LEFT ATRIUM             Index        RIGHT ATRIUM           Index LA diam:        4.30 cm 2.70 cm/m   RA Area:     19.50 cm LA Vol (A2C):   81.1 ml 50.93 ml/m  RA Volume:   53.30 ml  33.47 ml/m LA Vol (A4C):   69.5 ml 43.64 ml/m LA Biplane Vol: 79.8 ml 50.11 ml/m  AORTIC VALVE LVOT Vmax:   109.00 cm/s LVOT Vmean:  69.500 cm/s LVOT VTI:    0.183 m  AORTA Ao Root diam: 3.10 cm Ao Asc diam:  3.50 cm MITRAL VALVE                TRICUSPID VALVE MV Area (PHT): 6.32 cm     TR Peak grad:   19.7 mmHg MV Decel Time: 120 msec     TR Vmax:        222.00 cm/s MV E velocity: 126.00 cm/s MV A velocity: 68.30 cm/s   SHUNTS MV E/A ratio:  1.84         Systemic VTI:  0.18 m                             Systemic Diam: 2.00 cm Candee Furbish MD Electronically signed by Candee Furbish MD Signature Date/Time: 01/06/2022/1:57:47 PM    Final     Medications:  nitroGLYCERIN 95 mcg/min (01/06/22 1546)    aspirin  325 mg Oral Daily   calcium acetate  1,334 mg Oral TID WC   carvedilol  12.5 mg Oral BID WC   Chlorhexidine Gluconate Cloth  6 each Topical Q0600   cloNIDine  0.3 mg Oral TID   colchicine  0.3 mg Oral BID   heparin  5,000 Units Subcutaneous Q8H   hydrALAZINE  25 mg Oral TID   hydroxychloroquine  100 mg Oral Q48H   nicotine  21 mg Transdermal Daily   [START ON 01/07/2022] predniSONE  40 mg Oral Q breakfast   torsemide  20 mg Oral BID    Dialysis Orders: [facility] 4hrs 400/800 52kg 2K 2Ca TDC Hectorol 67mcg qHD No Heparin Mircera 224mcg q2wks - last 01/02/22  Calcium acetate 2AC TID  Assessment/Plan: 1.  Pericarditis/pericardial effusion - chronic issue. plan for repeat ECHO. Per PMD 2. Hyperkalemia - K 6.0, HD completed overnight.  3. Malignant hypertension - on nitro drip.  Continue home meds. Per PMD 4.  UTI - reports dysuria, flank pain and decreased UOP. Order UA/UC. 5. ESRD - on HD TTS.  HD overnight d/t hyperkalemia. Orders written for HD tomorrow per regular schedule.  6. Anemia of CKD- Hgb 9.5. ESA recently dosed. 7. Secondary hyperparathyroidism - Calcium at goal.  Continue VDRA. Phos low - hold binders & recheck, usually 7-10 range as outpatient.  8. Volume - Does not appear volume overloaded. Small pleural effusions noted on CXR.  Under dry weight if weights correct.  UF as tolerated. 9. Nutrition - Renal diet with fluid restrictions.  10. Lupus - on plaquenil and prednisone.  Jen Mow, PA-C [facility] 01/06/2022,3:59 PM  LOS: 0 days

## 2022-01-06 NOTE — Significant Event (Signed)
Rapid Response Event Note   Reason for Call :  HTN-pt arrived to 5W PCU on ntg gtt for BP control.   Initial Focused Assessment:  Pt lying in bed with eyes open, in no distress. She denies chest pain but does c/o SOB(this is not new) and 9/10 headache. Lungs clear t/o. Pericardial friction rub present on auscultation(this is also not a new finding). Skin warm and dry.  T-98.7, HR-73, BP-157/124, RR-18, SpO2-98% on RA  Per pt, her BP at home is usually 180s.  Interventions:  Goal BP-keep SBP <160 2200 BP meds given: hydralazine, clonidine, torsemide  Plan of Care:  2155: POC 2200 BP meds given. Will attempt to titrate ntg gtt for SBP<160. If can not titrate gtt off pt will tx to ICU.  2240: Shirlee Limerick, PCCM NP to bedside. Goal SBP <170. PRN hydralazine dose increased. Fentanyl added for pain. Will titrate ntg for SBP<170.  2330-Ntg titrated off 20 min ago. BP-147/119. Pt will stay here. PRN hydralazine ordered if needed for SBP>170. Continue to monitor pt closely. Call RRT if further assistance needed.   Event Summary:   MD Notified: Dr. Hal Hope, [facility] consulted [place], Bronte Kropf Anderson, RN

## 2022-01-07 ENCOUNTER — Inpatient Hospital Stay (HOSPITAL_COMMUNITY): Payer: Medicare Other

## 2022-01-07 DIAGNOSIS — I2699 Other pulmonary embolism without acute cor pulmonale: Principal | ICD-10-CM | POA: Diagnosis present

## 2022-01-07 LAB — IRON AND TIBC
Iron: 46 ug/dL (ref 28–170)
Saturation Ratios: 17 % (ref 10.4–31.8)
TIBC: 277 ug/dL (ref 250–450)
UIBC: 231 ug/dL

## 2022-01-07 LAB — CBC
HCT: 25.2 % — ABNORMAL LOW (ref 36.0–46.0)
Hemoglobin: 8 g/dL — ABNORMAL LOW (ref 12.0–15.0)
MCH: 28.4 pg (ref 26.0–34.0)
MCHC: 31.7 g/dL (ref 30.0–36.0)
MCV: 89.4 fL (ref 80.0–100.0)
Platelets: 294 10*3/uL (ref 150–400)
RBC: 2.82 MIL/uL — ABNORMAL LOW (ref 3.87–5.11)
RDW: 16.4 % — ABNORMAL HIGH (ref 11.5–15.5)
WBC: 8.4 10*3/uL (ref 4.0–10.5)
nRBC: 0.7 % — ABNORMAL HIGH (ref 0.0–0.2)

## 2022-01-07 LAB — RENAL FUNCTION PANEL
Albumin: 2.5 g/dL — ABNORMAL LOW (ref 3.5–5.0)
Anion gap: 10 (ref 5–15)
BUN: 54 mg/dL — ABNORMAL HIGH (ref 6–20)
CO2: 27 mmol/L (ref 22–32)
Calcium: 7.7 mg/dL — ABNORMAL LOW (ref 8.9–10.3)
Chloride: 96 mmol/L — ABNORMAL LOW (ref 98–111)
Creatinine, Ser: 6.46 mg/dL — ABNORMAL HIGH (ref 0.44–1.00)
GFR, Estimated: 8 mL/min — ABNORMAL LOW (ref >60)
Glucose, Bld: 88 mg/dL (ref 70–99)
Phosphorus: 2.8 mg/dL (ref 2.5–4.6)
Potassium: 5.1 mmol/L (ref 3.5–5.1)
Sodium: 133 mmol/L — ABNORMAL LOW (ref 135–145)

## 2022-01-07 LAB — HEPATITIS B SURFACE ANTIBODY, QUANTITATIVE: Hep B S AB Quant (Post): 40.4 m[IU]/mL (ref >9.9)

## 2022-01-07 LAB — FERRITIN: Ferritin: 987 ng/mL — ABNORMAL HIGH (ref 11–307)

## 2022-01-07 LAB — GLUCOSE, CAPILLARY: Glucose-Capillary: 127 mg/dL — ABNORMAL HIGH (ref 70–99)

## 2022-01-07 LAB — HEPARIN LEVEL (UNFRACTIONATED): Heparin Unfractionated: 0.13 IU/mL — ABNORMAL LOW (ref 0.30–0.70)

## 2022-01-07 MED ORDER — HEPARIN SODIUM (PORCINE) 1000 UNIT/ML DIALYSIS
1000.0000 [IU] | INTRAMUSCULAR | Status: DC | PRN
  Filled 2022-01-07: qty 1

## 2022-01-07 MED ORDER — AMOXICILLIN-POT CLAVULANATE 875-125 MG PO TABS: 1.0000 | ORAL_TABLET | Freq: Two times a day (BID) | ORAL | Status: DC

## 2022-01-07 MED ORDER — IOHEXOL 350 MG/ML SOLN
75.0000 mL | Freq: Once | INTRAVENOUS | Status: AC | PRN
  Administered 2022-01-07: 80 mL via INTRAVENOUS

## 2022-01-07 MED ORDER — SODIUM CHLORIDE 0.9 % IV SOLN: 100.0000 mL | INTRAVENOUS | Status: DC | PRN

## 2022-01-07 MED ORDER — LIDOCAINE HCL (PF) 1 % IJ SOLN: 5.0000 mL | INTRAMUSCULAR | Status: DC | PRN

## 2022-01-07 MED ORDER — PENTAFLUOROPROP-TETRAFLUOROETH EX AERO: 1.0000 "application " | INHALATION_SPRAY | CUTANEOUS | Status: DC | PRN

## 2022-01-07 MED ORDER — ALTEPLASE 2 MG IJ SOLR: 2.0000 mg | Freq: Once | INTRAMUSCULAR | Status: DC | PRN

## 2022-01-07 MED ORDER — HEPARIN (PORCINE) 25000 UT/250ML-% IV SOLN
1100.0000 [IU]/h | INTRAVENOUS | Status: DC
  Administered 2022-01-07: 08:00:00 750 [IU]/h via INTRAVENOUS
  Filled 2022-01-07: qty 250

## 2022-01-07 MED ORDER — CHLORHEXIDINE GLUCONATE CLOTH 2 % EX PADS: 6.0000 | MEDICATED_PAD | Freq: Every day | CUTANEOUS | Status: DC

## 2022-01-07 MED ORDER — LIDOCAINE-PRILOCAINE 2.5-2.5 % EX CREA: 1.0000 "application " | TOPICAL_CREAM | CUTANEOUS | Status: DC | PRN

## 2022-01-07 MED ORDER — AMOXICILLIN-POT CLAVULANATE 500-125 MG PO TABS
1.0000 | ORAL_TABLET | Freq: Every day | ORAL | Status: DC
  Administered 2022-01-07 – 2022-01-08 (×2): 500 mg via ORAL
  Filled 2022-01-07 (×3): qty 1

## 2022-01-07 MED ORDER — COLCHICINE 0.3 MG HALF TABLET
0.3000 mg | ORAL_TABLET | Freq: Every day | ORAL | Status: DC
  Administered 2022-01-07 – 2022-01-09 (×3): 0.3 mg via ORAL
  Filled 2022-01-07 (×3): qty 1

## 2022-01-07 NOTE — Progress Notes (Deleted)
Office Visit Note  Patient: Cathy [name]             Date of Birth: 1981-02-28           MRN: [id number]             PCP: Pcp, No Referring: No ref. provider found Visit Date: 01/08/2022 Occupation: @GUAROCC @  Subjective:  No chief complaint on file.   History of Present Illness: Cathy [name] is a 41 y.o. female ***   Activities of Daily Living:  Patient reports morning stiffness for *** {minute/hour:19697}.   Patient {ACTIONS;DENIES/REPORTS:21021675::"Denies"} nocturnal pain.  Difficulty dressing/grooming: {ACTIONS;DENIES/REPORTS:21021675::"Denies"} Difficulty climbing stairs: {ACTIONS;DENIES/REPORTS:21021675::"Denies"} Difficulty getting out of chair: {ACTIONS;DENIES/REPORTS:21021675::"Denies"} Difficulty using hands for taps, buttons, cutlery, and/or writing: {ACTIONS;DENIES/REPORTS:21021675::"Denies"}  No Rheumatology ROS completed.   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/2020   Hyperkalemia 11/29/2020   Pericardial effusion    Aortic atherosclerosis ([place]) 11/07/2020   Pelvic adhesive disease 02/03/2019   Renal hypertension 02/02/2019   Non compliance w medication regimen 01/12/2019   Current chronic use of systemic steroids 01/12/2019   Anemia 10/12/2018   Systemic lupus erythematosus (SLE) with pericarditis ([place]) 09/20/2018   Supervision of pregnancy with grand multiparity in second trimester 09/20/2018   Substance abuse ([place]) 09/20/2018   MDD (major depressive  disorder) 10/11/2015   Asthma 09/22/2011    Past Medical History:  Diagnosis Date   Anasarca associated with disorder of kidney 06/08/2021   Anxiety    Asthma    Bipolar depression (HCC)    Depression    Dyspnea    ESRD (end stage renal disease) on dialysis ([place]) 10/2020   GERD (gastroesophageal reflux disease)    Gonorrhea    Lupus (HCC)    Pelvic inflammatory disease (PID)    Pleural effusion 06/08/2021   Renal hypertension    Trichomonas infection     Family History  Problem Relation Age of Onset   Diabetes Maternal Grandmother    Hypertension Maternal Grandmother    Diabetes Maternal Grandfather    Hypertension Maternal Grandfather    Diabetes Paternal Grandmother    Hypertension Paternal Grandmother    Diabetes Paternal Grandfather    Hypertension Paternal Grandfather    Past Surgical History:  Procedure Laterality Date   IR FLUORO GUIDE CV LINE RIGHT  11/30/2020   IR FLUORO GUIDE CV LINE RIGHT  06/13/2021   IR REMOVAL TUN CV CATH W/O FL  06/11/2021   IR US GUIDE VASC ACCESS RIGHT  11/30/2020   IR US GUIDE VASC ACCESS RIGHT  06/13/2021   RENAL BIOPSY     TEE WITHOUT CARDIOVERSION N/A 06/13/2021   Procedure: TRANSESOPHAGEAL ECHOCARDIOGRAM (TEE);  Surgeon: Josue Hector, MD;  Location: [facility];  Service: Cardiovascular;  Laterality: N/A;   TUBAL LIGATION N/A 02/03/2019   Procedure: POST PARTUM TUBAL LIGATION;  Surgeon: Aletha Halim, MD;  Location: [facility];  Service: Gynecology;  Laterality: N/A;   Social History   Social History Narrative   Not on file   Immunization History  Administered Date(s) Administered   Influenza Split 09/26/2011   Pneumococcal Polysaccharide-23 09/26/2011   Tdap 09/26/2011, 12/10/2018, 01/12/2019     Objective: Vital Signs: There were no vitals taken for this visit.   Physical Exam   Musculoskeletal Exam: ***  CDAI Exam: CDAI Score: -- Patient Global: --; Provider Global: -- Swollen: --; Tender: -- Joint Exam  01/08/2022   No joint exam has been documented for this visit   There is currently no information documented on the homunculus. Go to the Rheumatology activity and complete the homunculus joint exam.  Investigation: No additional findings.  Imaging: DG Chest 2 View  Result Date: 12/26/2021 CLINICAL DATA:  Chest pain. EXAM: CHEST - 2 VIEW COMPARISON:  Chest radiograph dated 12/17/2021. FINDINGS: Right-sided dialysis catheter in similar position. There is cardiomegaly. Small bilateral pleural effusions, left greater right, with bibasilar atelectasis or infiltrate. No pneumothorax. No acute osseous pathology. IMPRESSION: 1. Small bilateral pleural effusions with bibasilar atelectasis or infiltrate. Overall similar to prior radiograph. 2. Cardiomegaly. Electronically Signed   By: Anner Crete M.D.   On: 12/26/2021 21:32   CT Angio Chest PE W and/or Wo Contrast  Result Date: 12/13/2021 CLINICAL DATA:  Shortness of breath, chest pain, epigastric pain EXAM: CT ANGIOGRAPHY CHEST WITH CONTRAST TECHNIQUE: Multidetector CT imaging of the chest was performed using the standard protocol during bolus administration of intravenous contrast. Multiplanar CT image reconstructions and MIPs were obtained to evaluate the vascular anatomy. RADIATION DOSE REDUCTION: This exam was performed according to the departmental dose-optimization program which includes automated exposure control, adjustment of the mA and/or kV according to patient size and/or use of iterative reconstruction technique. CONTRAST:  122mL OMNIPAQUE IOHEXOL 350 MG/ML SOLN COMPARISON:  02/01/2019 FINDINGS: Cardiovascular: Heart is enlarged in size. There is moderate to large pericardial effusion. There is no contrast enhancement in thoracic aorta limiting evaluation of the lumen of thoracic aorta. There are no intraluminal filling defects in central pulmonary artery branches. Evaluation of small subsegmental peripheral branches in the lower lung  fields is limited by infiltrates. There is ectasia of main pulmonary artery measuring 4 cm. There is no evidence of right ventricular strain. Mediastinum/Nodes: No significant lymphadenopathy seen. Lungs/Pleura: There is moderate right pleural effusion. There is small left pleural effusion. Large patchy alveolar infiltrate is seen in right lower lobe. There are smaller patchy infiltrates in right middle lobe and left lower lobe. Upper Abdomen: Unremarkable. Musculoskeletal: Unremarkable. Review of the MIP images confirms the above findings. IMPRESSION: There is no evidence of central pulmonary artery embolism. Evaluation of small peripheral subsegmental branches in the lower lobes is limited by infiltrates. There is ectasia of main pulmonary artery suggesting pulmonary arterial hypertension. There is large patchy alveolar infiltrate in the right lower lobe. Small patchy alveolar infiltrates are seen in the right middle lobe and left lower lobe. Findings suggest possible multifocal pneumonia. Moderate right pleural effusion. Small left pleural effusion. Moderate to large pericardial effusion. Electronically Signed   By: Elmer Picker M.D.   On: 12/13/2021 13:44   CT ABDOMEN PELVIS W CONTRAST  Result Date: 12/13/2021 CLINICAL DATA:  Abdominal pain.  Patient unable to lie flat EXAM: CT ABDOMEN AND PELVIS WITH CONTRAST TECHNIQUE: Multidetector CT imaging of the abdomen and pelvis was performed using the standard protocol following bolus administration of intravenous contrast. RADIATION DOSE REDUCTION: This exam was performed according to the departmental dose-optimization program which includes automated exposure control, adjustment of the mA and/or kV according to patient size and/or use of iterative reconstruction  technique. CONTRAST:  175mL OMNIPAQUE IOHEXOL 350 MG/ML SOLN COMPARISON:  Chest CT same day FINDINGS: Lower chest: RIGHT lobe pneumonia. Large RIGHT effusion with passive atelectasis in the RIGHT  lower lobe. Cardiomegaly Hepatobiliary: No enhancing hepatic lesion. Portal veins are patent. There is periportal edema. Small fluid around the liver. Gallbladder normal. Pancreas: small cyst in the pancreas measures 6 mm (image 47/3). No worrisome characteristics. No communication with the pancreatic duct. No pancreatic duct dilatation. Spleen: Normal spleen Adrenals/urinary tract: Adrenal glands normal. Four enhancement of the renal parenchyma. No hydronephrosis. Ureters and bladder grossly normal. Bladder collapsed Stomach/Bowel: Stomach, small bowel normal. Colon rectosigmoid colon normal. Vascular/Lymphatic: Abdominal aorta is normal caliber. There is no retroperitoneal or periportal lymphadenopathy. No pelvic lymphadenopathy. Reproductive: Uterus and adnexa unremarkable. Other: Normal moderate volume of intraperitoneal free fluid there is prominent pelvis. Mild anasarca soft tissues Musculoskeletal: No aggressive osseous lesion. IMPRESSION: 1. RIGHT lower lobe pneumonia. 2. Moderate volume intraperitoneal free fluid, anasarca and pleural effusions suggest volume overload/congestive heart failure. 3. Poor renal enhancement consistent with renal insufficiency. 4. Small benign appearing cyst in the pancreas. Per consensus recommendation, consider follow-up MRI in 1 year. This recommendation follows ACR consensus guidelines: Management of Incidental Pancreatic Cysts: A White Paper of the ACR Incidental Findings Committee. [place] 9381;82:993-716. Electronically Signed   By: Suzy Bouchard M.D.   On: 12/13/2021 13:51   DG Chest Port 1 View  Result Date: 01/05/2022 CLINICAL DATA:  Dyspnea, missed dialysis EXAM: PORTABLE CHEST 1 VIEW COMPARISON:  12/26/2021 FINDINGS: Moderate to severe cardiomegaly is stable. Pulmonary vascularity is normal. Small bilateral pleural effusions are present, left greater than right. No pneumothorax. No confluent pulmonary infiltrate. Right internal jugular hemodialysis  catheter tip noted within the right atrium. No acute bone abnormality. IMPRESSION: Stable cardiomegaly. Small bilateral pleural effusions, left greater than right. Electronically Signed   By: Fidela Salisbury M.D.   On: 01/05/2022 19:52   DG Chest Port 1 View  Result Date: 12/17/2021 CLINICAL DATA:  Acute respiratory failure. EXAM: PORTABLE CHEST 1 VIEW COMPARISON:  12/16/2021 FINDINGS: There is a right-sided dialysis catheter with tip in the cavoatrial junction. The right-sided chest tube has been removed. No pneumothorax visualized. Unchanged enlargement of the cardiac silhouette reflecting underlying pericardial effusion. Bilateral pleural effusions are again noted and appear unchanged. Right lower lobe airspace disease is unchanged. Atelectasis within the left base is also stable. IMPRESSION: 1. No change in bilateral pleural effusions with lower lobe airspace consolidation and atelectasis. 2. Stable enlargement of the cardiac silhouette reflecting underlying pericardial effusion. 3. Interval removal of right chest tube without visible pneumothorax. Electronically Signed   By: Kerby Moors M.D.   On: 12/17/2021 05:58   DG CHEST PORT 1 VIEW  Result Date: 12/16/2021 CLINICAL DATA:  Chest tube, pleural effusion EXAM: PORTABLE CHEST 1 VIEW COMPARISON:  12/15/2021 FINDINGS: No significant change in AP portable chest radiograph. Pigtail chest tube remains about the right lung base with associated small effusion and atelectasis or consolidation. No significant pneumothorax. Probable small left pleural effusion and or retrocardiac atelectasis. Cardiomegaly. Large-bore right neck multi lumen vascular catheter. IMPRESSION: 1. No significant change in AP portable chest radiograph. Pigtail chest tube remains about the right lung base with associated small effusion and atelectasis or consolidation. No significant pneumothorax. 2. Probable small left pleural effusion and or retrocardiac atelectasis. 3.  Cardiomegaly.  Electronically Signed   By: Delanna Ahmadi M.D.   On: 12/16/2021 09:07   DG CHEST PORT 1 VIEW  Result Date: 12/15/2021 CLINICAL DATA:  Pleural effusion, difficulty breathing EXAM: PORTABLE CHEST 1 VIEW COMPARISON:  Previous studies including the examination of 12/14/2021 FINDINGS: Transverse diameter of heart is increased. There are no signs of pulmonary edema. Infiltrates are seen in both lower lung fields with interval partial clearing. Right chest tube is noted with its tip in the medial right lower lung fields. Tip of dialysis catheter is seen in the region right atrium. There is no pneumothorax. IMPRESSION: Cardiomegaly. There are no signs of pulmonary edema. There is interval improvement in aeration of both lower lung fields suggesting partial clearing of atelectasis/pneumonia and possibly decrease in pleural effusions. There is no pneumothorax. Electronically Signed   By: Elmer Picker M.D.   On: 12/15/2021 08:38   DG Chest Port 1 View  Result Date: 12/14/2021 CLINICAL DATA:  Chest tube placement, recheck right pneumothorax. EXAM: PORTABLE CHEST 1 VIEW COMPARISON:  Chest CT with contrast yesterday at 1:18 p.m., portable chest yesterday at 5:01 p.m. FINDINGS: Pigtail right pleural drain again has its tip in the medial base of the right chest. Minimal right apical pneumothorax estimated 3% or less continues to be noted and is unchanged. Right basilar airspace disease and at least a small pleural effusion continue to be noted with dense left lower lobe consolidation still present and a small left effusion. The more cephalad lung fields are clear. There is a right IJ double-lumen catheter with the tip in the right atrium. There is cardiomegaly with normal caliber central vessels and stable mediastinum. IMPRESSION: There are no changes in the overall aeration from yesterday's most recent film. Minimal 3% or less right apical pneumothorax persists as well as right basilar airspace disease and small  right effusion. Aeration on left is likewise unchanged with denser lower lobe consolidation and small effusion on this side as well. Chest tube positioning is unaltered. Electronically Signed   By: Telford Nab M.D.   On: 12/14/2021 03:54   DG Chest Port 1 View  Result Date: 12/13/2021 CLINICAL DATA:  Right pleural effusion.  Chest tube placement. EXAM: PORTABLE CHEST 1 VIEW COMPARISON:  12/13/2021 FINDINGS: A new right pleural pigtail catheter is seen in the lower right hemithorax, with decreased size of small right pleural effusion since prior study. A tiny less than 5% right apical pneumothorax is now seen. Airspace disease in the right lower lung shows no significant change. Small left pleural effusion and left retrocardiac atelectasis or infiltrate, also unchanged. Cardiomegaly stable. A right jugular dual-lumen central venous catheter is seen with tip overlying the mid right atrium. IMPRESSION: Decreased right pleural effusion following pleural drainage catheter placement. Tiny less than 5% right apical pneumothorax. Stable right lower lung airspace disease. Stable small left pleural effusion, and left basilar atelectasis versus infiltrate. Stable cardiomegaly. Electronically Signed   By: Marlaine Hind M.D.   On: 12/13/2021 17:29   DG Chest Port 1 View  Result Date: 12/13/2021 CLINICAL DATA:  Questionable sepsis.  Evaluate for abnormality. EXAM: PORTABLE CHEST 1 VIEW COMPARISON:  06/15/2021 FINDINGS: There is a right jugular dialysis catheter with the tip extending into the right atrium. Bibasilar chest densities are compatible with pleural effusions and basilar consolidation or airspace disease. Pleural effusions are likely moderate in size particularly on the right side. Negative for pneumothorax. Cardiac silhouette appears to be enlarged and stable from the previous examination. IMPRESSION: New or enlarged bibasilar chest densities. Findings are suggestive for combination of pleural effusions with  consolidation/airspace disease. Underlying pneumonia cannot be excluded. Dialysis  catheter as described. Electronically Signed   By: Markus Daft M.D.   On: 12/13/2021 11:37   ECHOCARDIOGRAM COMPLETE  Result Date: 01/06/2022    ECHOCARDIOGRAM REPORT   Patient Name:   Cathy [name] Date of Exam: 01/06/2022 Medical Rec #:  [id number]        Height:       67.0 in Accession #:    [id number]       Weight:       114.0 lb Date of Birth:  1981-10-14       BSA:          1.593 m Patient Age:    89 years         BP:           177/125 mmHg Patient Gender: F                HR:           86 bpm. Exam Location:  Inpatient Procedure: 2D Echo, Cardiac Doppler and Color Doppler Indications:    pericardial effusion  History:        Patient has prior history of Echocardiogram examinations, most                 recent 12/27/2021. Risk Factors:Hypertension. ESRD.  Sonographer:    Melissa Morford RDCS (AE, PE) Referring Phys: [place]  1. Left ventricular ejection fraction, by estimation, is 20 to 25%. The left ventricle has severely decreased function. The left ventricle demonstrates global hypokinesis. Left ventricular diastolic parameters are consistent with Grade III diastolic dysfunction (restrictive).  2. Right ventricular systolic function is normal. The right ventricular size is normal.  3. Left atrial size was mildly dilated.  4. Right atrial size was mildly dilated.  5. Moderate pericardial effusion. The pericardial effusion is circumferential. There is no evidence of cardiac tamponade.  6. The mitral valve is normal in structure. Mild mitral valve regurgitation. No evidence of mitral stenosis.  7. Tricuspid valve regurgitation is moderate.  8. The aortic valve is normal in structure. Aortic valve regurgitation is mild. No aortic stenosis is present.  9. Pulmonic valve regurgitation is moderate. 10. The inferior vena cava is normal in size with greater than 50% respiratory variability, suggesting right atrial  pressure of 3 mmHg. Comparison(s): No significant change from prior study. Prior images reviewed side by side. FINDINGS  Left Ventricle: Left ventricular ejection fraction, by estimation, is 20 to 25%. The left ventricle has severely decreased function. The left ventricle demonstrates global hypokinesis. The left ventricular internal cavity size was normal in size. There is no left ventricular hypertrophy. Left ventricular diastolic parameters are consistent with Grade III diastolic dysfunction (restrictive). Right Ventricle: The right ventricular size is normal. No increase in right ventricular wall thickness. Right ventricular systolic function is normal. Left Atrium: Left atrial size was mildly dilated. Right Atrium: Right atrial size was mildly dilated. Pericardium: A moderately sized pericardial effusion is present. The pericardial effusion is circumferential. There is no evidence of cardiac tamponade. Mitral Valve: The mitral valve is normal in structure. Mild mitral valve regurgitation. No evidence of mitral valve stenosis. Tricuspid Valve: The tricuspid valve is normal in structure. Tricuspid valve regurgitation is moderate . No evidence of tricuspid stenosis. Aortic Valve: The aortic valve is normal in structure. Aortic valve regurgitation is mild. No aortic stenosis is present. Pulmonic Valve: The pulmonic valve was normal in structure. Pulmonic valve regurgitation is moderate. No evidence of pulmonic stenosis. Aorta:  The aortic root is normal in size and structure. Venous: The inferior vena cava is normal in size with greater than 50% respiratory variability, suggesting right atrial pressure of 3 mmHg. IAS/Shunts: No atrial level shunt detected by color flow Doppler.  LEFT VENTRICLE PLAX 2D LVIDd:         5.17 cm      Diastology LVIDs:         4.62 cm      LV e' medial:    5.77 cm/s LV PW:         1.11 cm      LV E/e' medial:  21.8 LV IVS:        1.16 cm      LV e' lateral:   5.93 cm/s LVOT diam:     2.00  cm      LV E/e' lateral: 21.2 LV SV:         57 LV SV Index:   36 LVOT Area:     3.14 cm  LV Volumes (MOD) LV vol d, MOD A2C: 133.0 ml LV vol d, MOD A4C: 104.5 ml LV vol s, MOD A2C: 110.0 ml LV vol s, MOD A4C: 96.6 ml LV SV MOD A2C:     23.0 ml LV SV MOD A4C:     104.5 ml LV SV MOD BP:      15.0 ml RIGHT VENTRICLE TAPSE (M-mode): 1.6 cm LEFT ATRIUM             Index        RIGHT ATRIUM           Index LA diam:        4.30 cm 2.70 cm/m   RA Area:     19.50 cm LA Vol (A2C):   81.1 ml 50.93 ml/m  RA Volume:   53.30 ml  33.47 ml/m LA Vol (A4C):   69.5 ml 43.64 ml/m LA Biplane Vol: 79.8 ml 50.11 ml/m  AORTIC VALVE LVOT Vmax:   109.00 cm/s LVOT Vmean:  69.500 cm/s LVOT VTI:    0.183 m  AORTA Ao Root diam: 3.10 cm Ao Asc diam:  3.50 cm MITRAL VALVE                TRICUSPID VALVE MV Area (PHT): 6.32 cm     TR Peak grad:   19.7 mmHg MV Decel Time: 120 msec     TR Vmax:        222.00 cm/s MV E velocity: 126.00 cm/s MV A velocity: 68.30 cm/s   SHUNTS MV E/A ratio:  1.84         Systemic VTI:  0.18 m                             Systemic Diam: 2.00 cm Candee Furbish MD Electronically signed by Candee Furbish MD Signature Date/Time: 01/06/2022/1:57:47 PM    Final    ECHOCARDIOGRAM COMPLETE  Result Date: 12/13/2021    ECHOCARDIOGRAM REPORT   Patient Name:   Cathy [name] Date of Exam: 12/13/2021 Medical Rec #:  [id number]        Height:       67.0 in Accession #:    [id number]       Weight:       146.2 lb Date of Birth:  15-Sep-1981       BSA:          1.770 m Patient Age:  40 years         BP:           178/136 mmHg Patient Gender: F                HR:           93 bpm. Exam Location:  Inpatient Procedure: 2D Echo, Cardiac Doppler and Color Doppler Indications:    plureral effusion  History:        Patient has no prior history of Echocardiogram examinations.                 Signs/Symptoms:Dyspnea. GERD / PLEURAL EFFUSION.  Sonographer:    Beryle Beams Referring Phys: [id number] [place]  1. Left ventricular  ejection fraction, by estimation, is 25 to 30%. The left ventricle has severely decreased function. The left ventricle demonstrates global hypokinesis. The left ventricular internal cavity size was mildly dilated. There is mild concentric left ventricular hypertrophy. Left ventricular diastolic parameters are consistent with Grade II diastolic dysfunction (pseudonormalization).  2. Right ventricular systolic function mildly-to-moderately reduced. The right ventricular size is mildly-to-moderately enlarged. There is mildly elevated pulmonary artery systolic pressure. The estimated right ventricular systolic pressure is 53.2 mmHg.  3. Left atrial size was severely dilated.  4. Right atrial size was moderately dilated.  5. Moderate pericardial effusion. The pericardial effusion is circumferential. There is no evidence of cardiac tamponade.  6. The mitral valve is abnormal. There is mitral annular dilation with tethering of the posterior leaflet and mild mitral regurgitation.  7. Tricuspid valve regurgitation is moderate.  8. The aortic valve is tricuspid. There is mild thickening of the aortic valve. Aortic valve regurgitation is mild. No aortic stenosis is present.  9. The inferior vena cava is dilated in size with <50% respiratory variability, suggesting right atrial pressure of 15 mmHg. Comparison(s): Compared to prior TTE in 05/2021, there is no significant change. There continues to be biventricular dysfunction and a moderate pericardial effusion present without tamponade. FINDINGS  Left Ventricle: Left ventricular ejection fraction, by estimation, is 25 to 30%. The left ventricle has severely decreased function. The left ventricle demonstrates global hypokinesis. The left ventricular internal cavity size was mildly dilated. There is mild concentric left ventricular hypertrophy. Left ventricular diastolic parameters are consistent with Grade II diastolic dysfunction (pseudonormalization). Right Ventricle: The  right ventricular size is mildly-to-moderately enlarged. No increase in right ventricular wall thickness. Right ventricular systolic function mildly-to-moderately reduced. There is mildly elevated pulmonary artery systolic pressure. The tricuspid regurgitant velocity is 2.62 m/s, and with an assumed right atrial pressure of 10 mmHg, the estimated right ventricular systolic pressure is 99.2 mmHg. Left Atrium: Left atrial size was severely dilated. Right Atrium: Right atrial size was moderately dilated. Pericardium: Measuring up to 1.5cm at end-diastole. A moderately sized pericardial effusion is present. The pericardial effusion is circumferential. There is no evidence of cardiac tamponade. Mitral Valve: The mitral valve is abnormal. There is mild thickening of the mitral valve leaflet(s). There is mild calcification of the mitral valve leaflet(s). There is mild mitral regurgitation. Tricuspid Valve: The tricuspid valve is normal in structure. Tricuspid valve regurgitation is moderate. Aortic Valve: The aortic valve is tricuspid. There is mild thickening of the aortic valve. Aortic valve regurgitation is mild. Aortic regurgitation PHT measures 462 msec. No aortic stenosis is present. Aortic valve mean gradient measures 4.0 mmHg. Aortic  valve peak gradient measures 6.1 mmHg. Aortic valve area, by VTI measures 1.18 cm. Pulmonic Valve: The pulmonic valve  was normal in structure. Pulmonic valve regurgitation is mild. Aorta: The aortic root and ascending aorta are structurally normal, with no evidence of dilitation. Venous: The inferior vena cava is dilated in size with less than 50% respiratory variability, suggesting right atrial pressure of 15 mmHg. IAS/Shunts: The atrial septum is grossly normal.  LEFT VENTRICLE PLAX 2D LVIDd:         2.32 cm      Diastology LVIDs:         3.90 cm      LV e' medial:    6.20 cm/s LV PW:         1.50 cm      LV E/e' medial:  16.6 LV IVS:        3.36 cm      LV e' lateral:   8.81 cm/s  LVOT diam:     1.70 cm      LV E/e' lateral: 11.7 LV SV:         20 LV SV Index:   11 LVOT Area:     2.27 cm  LV Volumes (MOD) LV vol d, MOD A2C: 132.0 ml LV vol d, MOD A4C: 117.0 ml LV vol s, MOD A2C: 98.8 ml LV vol s, MOD A4C: 107.0 ml LV SV MOD A2C:     33.2 ml LV SV MOD A4C:     117.0 ml LV SV MOD BP:      30.0 ml RIGHT VENTRICLE             IVC RV S prime:     11.30 cm/s  IVC diam: 2.20 cm TAPSE (M-mode): 2.0 cm LEFT ATRIUM              Index        RIGHT ATRIUM           Index LA diam:        4.30 cm  2.43 cm/m   RA Area:     14.10 cm LA Vol (A2C):   106.0 ml 59.89 ml/m  RA Volume:   33.40 ml  18.87 ml/m LA Vol (A4C):   73.0 ml  41.24 ml/m LA Biplane Vol: 89.6 ml  50.62 ml/m  AORTIC VALVE                    PULMONIC VALVE AV Area (Vmax):    1.40 cm     PV Vmax:       0.69 m/s AV Area (Vmean):   1.19 cm     PV Vmean:      37.100 cm/s AV Area (VTI):     1.18 cm     PV VTI:        0.066 m AV Vmax:           123.00 cm/s  PV Peak grad:  1.9 mmHg AV Vmean:          91.700 cm/s  PV Mean grad:  1.0 mmHg AV VTI:            0.171 m AV Peak Grad:      6.1 mmHg AV Mean Grad:      4.0 mmHg LVOT Vmax:         75.80 cm/s LVOT Vmean:        48.200 cm/s LVOT VTI:          0.089 m LVOT/AV VTI ratio: 0.52 AI PHT:            462  msec  AORTA Ao Root diam: 2.70 cm Ao Asc diam:  2.30 cm MITRAL VALVE                TRICUSPID VALVE MV Area (PHT): 5.34 cm     TV Peak grad:   68.6 mmHg MV Decel Time: 142 msec     TV Mean grad:   54.0 mmHg MV E velocity: 103.00 cm/s  TV Vmax:        4.14 m/s MV A velocity: 60.70 cm/s   TV Vmean:       355.0 cm/s MV E/A ratio:  1.70         TV VTI:         1.26 msec                             TR Peak grad:   27.5 mmHg                             TR Vmax:        262.00 cm/s                              SHUNTS                             Systemic VTI:  0.09 m                             Systemic Diam: 1.70 cm Gwyndolyn Kaufman MD Electronically signed by Gwyndolyn Kaufman MD Signature Date/Time:  12/13/2021/6:58:25 PM    Final    CT Angio Chest/Abd/Pel for Dissection W and/or W/WO  Result Date: 01/07/2022 CLINICAL DATA:  41 year old female with history of chest or back pain. Suspected aortic dissection. EXAM: CT ANGIOGRAPHY CHEST, ABDOMEN AND PELVIS TECHNIQUE: Non-contrast CT of the chest was initially obtained. Multidetector CT imaging through the chest, abdomen and pelvis was performed using the standard protocol during bolus administration of intravenous contrast. Multiplanar reconstructed images and MIPs were obtained and reviewed to evaluate the vascular anatomy. RADIATION DOSE REDUCTION: This exam was performed according to the departmental dose-optimization program which includes automated exposure control, adjustment of the mA and/or kV according to patient size and/or use of iterative reconstruction technique. CONTRAST:  47mL OMNIPAQUE IOHEXOL 350 MG/ML SOLN COMPARISON:  CT the chest, abdomen and pelvis 12/27/2021. FINDINGS: CTA CHEST FINDINGS Cardiovascular: There is an occlusive segmental sized filling defect in a right lower lobe pulmonary artery branch best appreciated on axial image 92 of series 8. Heart size is mildly enlarged. Moderate to large volume of pericardial fluid. No pericardial calcification. Precontrast images demonstrate no crescentic high attenuation associated with the wall of the thoracic aorta to suggest acute intramural hemorrhage. No evidence of thoracic aortic aneurysm or dissection. No atherosclerotic calcifications are noted in the thoracic aorta or the coronary arteries. Right internal jugular PermCath with tip terminating in the right atrium. Mediastinum/Nodes: No pathologically enlarged mediastinal or hilar lymph nodes. Esophagus is unremarkable in appearance. No axillary lymphadenopathy. Lungs/Pleura: Area of peripheral airspace consolidation in the right lower lobe distal to the occlusive pulmonary embolus, compatible with alveolar hemorrhage in the setting of  acute pulmonary infarction. Extensive passive atelectasis in the left lower lobe and to a lesser extent in  the inferior segment of the lingula. Trace right and small left pleural effusions lying dependently. Musculoskeletal: There are no aggressive appearing lytic or blastic lesions noted in the visualized portions of the skeleton. Review of the MIP images confirms the above findings. CTA ABDOMEN AND PELVIS FINDINGS VASCULAR Aorta: Normal caliber aorta without aneurysm, dissection, vasculitis or significant stenosis. Celiac: Patent without evidence of aneurysm, dissection, vasculitis or significant stenosis. SMA: Patent without evidence of aneurysm, dissection, vasculitis or significant stenosis. Renals: Both renal arteries are patent without evidence of aneurysm, dissection, vasculitis, fibromuscular dysplasia or significant stenosis. IMA: Patent without evidence of aneurysm, dissection, vasculitis or significant stenosis. Inflow: Patent without evidence of aneurysm, dissection, vasculitis or significant stenosis. Veins: No obvious venous abnormality within the limitations of this arterial phase study. Review of the MIP images confirms the above findings. NON-VASCULAR Hepatobiliary: No definite suspicious cystic or solid hepatic lesions are confidently identified on today's arterial phase examination. No intra or extrahepatic biliary ductal dilatation. Gallbladder is unremarkable in appearance. Pancreas: No definite pancreatic mass or peripancreatic fluid collections or inflammatory changes are noted on today's arterial phase examination. No pancreatic ductal dilatation. Spleen: Unremarkable. Adrenals/Urinary Tract: Bilateral kidneys and adrenal glands are normal in appearance. No hydroureteronephrosis. Urinary bladder is normal in appearance. Stomach/Bowel: The appearance of the stomach is normal. No pathologic dilatation of small bowel or colon. Lymphatic: Enlarged left para-aortic lymph node measuring up to 1.5  cm in short axis (axial image 167 of series 8). No other definite lymphadenopathy noted elsewhere in the abdomen or pelvis. Reproductive: Uterus and ovaries are unremarkable in appearance. Bilateral tubal ligation clips are noted. Other: Small to moderate volume of ascites.  No pneumoperitoneum. Musculoskeletal: There are no aggressive appearing lytic or blastic lesions noted in the visualized portions of the skeleton. Review of the MIP images confirms the above findings. IMPRESSION: 1. Study is positive for segmental sized pulmonary embolism in the right lower lobe with area of alveolar hemorrhage in the anterolateral aspect of the right lower lobe. 2. Small left and trace right pleural effusions with some passive atelectasis in the left lung base. 3. Moderate to large pericardial effusion. 4. Small volume of ascites. 5. Enlarged left para-aortic lymph node measuring 1.5 cm in short axis. This is nonspecific, but attention on follow-up studies is recommended. 6. Additional incidental findings, as above. These results will be called to the ordering clinician or representative by the Radiologist Assistant, and communication documented in the PACS or [employer]. Electronically Signed   By: Vinnie Langton M.D.   On: 01/07/2022 05:58   ECHOCARDIOGRAM LIMITED  Result Date: 12/27/2021    ECHOCARDIOGRAM LIMITED REPORT   Patient Name:   Cathy [name] Date of Exam: 12/27/2021 Medical Rec #:  [id number]        Height:       67.0 in Accession #:    [id number]       Weight:       102.3 lb Date of Birth:  12/18/1980       BSA:          1.521 m Patient Age:    31 years         BP:           175/118 mmHg Patient Gender: F                HR:           73 bpm. Exam Location:  Inpatient Procedure: Limited Echo, Limited Color Doppler and  Cardiac Doppler Indications:    pericardial effusion  History:        Patient has prior history of Echocardiogram examinations, most                 recent 12/13/2021. End stage renal  disease. Lupus.; Risk                 Factors:Hypertension.  Sonographer:    Johny Chess RDCS Referring Phys: [id number] [place]  1. Left ventricular ejection fraction, by estimation, is 25 to 30%. The left ventricle has severely decreased function. The left ventricle demonstrates global hypokinesis.  2. Right ventricular systolic function is moderately reduced. The right ventricular size is normal. There is normal pulmonary artery systolic pressure. The estimated right ventricular systolic pressure is 83.3 mmHg.  3. Moderate pericardial effusion. The pericardial effusion is circumferential.  4. The mitral valve is normal in structure. Mild mitral valve regurgitation. No evidence of mitral stenosis.  5. The aortic valve is normal in structure. Aortic valve regurgitation is trivial. No aortic stenosis is present.  6. The inferior vena cava is normal in size with greater than 50% respiratory variability, suggesting right atrial pressure of 3 mmHg. FINDINGS  Left Ventricle: Left ventricular ejection fraction, by estimation, is 25 to 30%. The left ventricle has severely decreased function. The left ventricle demonstrates global hypokinesis. The left ventricular internal cavity size was normal in size. There is no left ventricular hypertrophy. Right Ventricle: The right ventricular size is normal. No increase in right ventricular wall thickness. Right ventricular systolic function is moderately reduced. There is normal pulmonary artery systolic pressure. The tricuspid regurgitant velocity is 2.74 m/s, and with an assumed right atrial pressure of 3 mmHg, the estimated right ventricular systolic pressure is 82.5 mmHg. Left Atrium: Left atrial size was normal in size. Right Atrium: Right atrial size was normal in size. Pericardium: A moderately sized pericardial effusion is present. The pericardial effusion is circumferential. Mitral Valve: The mitral valve is normal in structure. Mild mitral valve  regurgitation. No evidence of mitral valve stenosis. Tricuspid Valve: The tricuspid valve is normal in structure. Tricuspid valve regurgitation is mild . No evidence of tricuspid stenosis. Aortic Valve: The aortic valve is normal in structure. Aortic valve regurgitation is trivial. No aortic stenosis is present. Pulmonic Valve: The pulmonic valve was normal in structure. Pulmonic valve regurgitation is trivial. No evidence of pulmonic stenosis. Aorta: The aortic root is normal in size and structure. Venous: The inferior vena cava is normal in size with greater than 50% respiratory variability, suggesting right atrial pressure of 3 mmHg. IAS/Shunts: No atrial level shunt detected by color flow Doppler. LEFT VENTRICLE PLAX 2D LVIDd:         4.90 cm Diastology LVIDs:         4.00 cm LV e' medial:  6.31 cm/s LV PW:         1.50 cm LV e' lateral: 5.55 cm/s LV IVS:        1.10 cm  IVC IVC diam: 1.50 cm  AORTA Ao Asc diam: 3.20 cm TRICUSPID VALVE TR Peak grad:   30.0 mmHg TR Vmax:        274.00 cm/s Fransico Him MD Electronically signed by Fransico Him MD Signature Date/Time: 12/27/2021/10:32:35 AM    Final    CT CHEST ABDOMEN PELVIS WO CONTRAST  Result Date: 12/27/2021 CLINICAL DATA:  None traumatic chest wall pain. Concern for infection or inflammation. EXAM: CT CHEST, ABDOMEN AND PELVIS  WITHOUT CONTRAST TECHNIQUE: Multidetector CT imaging of the chest, abdomen and pelvis was performed following the standard protocol without IV contrast. RADIATION DOSE REDUCTION: This exam was performed according to the departmental dose-optimization program which includes automated exposure control, adjustment of the mA and/or kV according to patient size and/or use of iterative reconstruction technique. COMPARISON:  CT of the abdomen pelvis dated 12/13/2021. FINDINGS: Evaluation of this exam is limited in the absence of intravenous contrast. Evaluation is also limited due to streak artifact caused by overlying support wires. CT  CHEST FINDINGS Cardiovascular: There is cardiomegaly. Dialysis catheter with tip at the cavoatrial junction. There is moderate pericardial effusion measuring 2.3 cm in thickness, increased since the prior CT. Correlation with echocardiogram recommended to exclude cardiac tamponade. The thoracic aorta and central pulmonary arteries are grossly unremarkable but poorly visualized. Mediastinum/Nodes: No hilar or mediastinal adenopathy. The esophagus and the thyroid gland are grossly unremarkable. No mediastinal fluid collection. Lungs/Pleura: Small left pleural effusion. There is diffuse interstitial and interlobular septal prominence of the left lung concerning for asymmetric edema. Patchy area of consolidation with air bronchogram in the right middle lobe consistent with atelectasis or pneumonia. No pneumothorax. The central airways are patent. Musculoskeletal: No chest wall mass or suspicious bone lesions identified. CT ABDOMEN PELVIS FINDINGS No intra-abdominal free air.  Small ascites. Hepatobiliary: The liver is unremarkable. No intrahepatic biliary dilatation. No calcified gallstone. Pancreas: Unremarkable. No pancreatic ductal dilatation or surrounding inflammatory changes. Spleen: Normal in size without focal abnormality. Adrenals/Urinary Tract: The adrenal glands unremarkable. There is no hydronephrosis or nephrolithiasis on either side. The visualized ureters and urinary bladder appear unremarkable. Stomach/Bowel: There is no bowel obstruction. The appendix is unremarkable as visualized. Vascular/Lymphatic: The abdominal aorta and IVC are grossly unremarkable. No portal venous gas. Reproductive: The uterus is anteverted. Bilateral tubal ligation clips. Other: None Musculoskeletal: No acute or significant osseous findings. IMPRESSION: 1. Cardiomegaly with moderate pericardial effusion, increased since the prior CT. Correlation with echocardiogram recommended to exclude cardiac tamponade. 2. Small left pleural  effusion. 3. Patchy area of consolidation with air bronchogram in the right middle lobe consistent with atelectasis or pneumonia. 4. Small ascites. 5. No hydronephrosis or nephrolithiasis. 6. No bowel obstruction. Electronically Signed   By: Anner Crete M.D.   On: 12/27/2021 02:36    Recent Labs: Lab Results  Component Value Date   WBC 8.4 01/07/2022   HGB 8.0 (L) 01/07/2022   PLT 294 01/07/2022   NA 133 (L) 01/07/2022   K 5.1 01/07/2022   CL 96 (L) 01/07/2022   CO2 27 01/07/2022   GLUCOSE 88 01/07/2022   BUN 54 (H) 01/07/2022   CREATININE 6.46 (H) 01/07/2022   BILITOT 0.2 (L) 01/06/2022   ALKPHOS 210 (H) 01/06/2022   AST 21 01/06/2022   ALT 21 01/06/2022   PROT 6.1 (L) 01/06/2022   ALBUMIN 2.5 (L) 01/07/2022   CALCIUM 7.7 (L) 01/07/2022   GFRAA >60 08/07/2020   QFTBGOLDPLUS Indeterminate (A) 11/30/2020    Speciality Comments: No specialty comments available.  Procedures:  No procedures performed Allergies: Patient has no active allergies.   Assessment / Plan:     Visit Diagnoses: No diagnosis found.  Orders: No orders of the defined types were placed in this encounter.  No orders of the defined types were placed in this encounter.   Face-to-face time spent with patient was *** minutes. Greater than 50% of time was spent in counseling and coordination of care.  Follow-Up Instructions: No follow-ups on file.  Collier Salina, MD  Note - This record has been created using [employer].  Chart creation errors have been sought, but may not always  have been located. Such creation errors do not reflect on  the standard of medical care.

## 2022-01-07 NOTE — Consult Note (Signed)
° °  [facility] CM Inpatient Consult   01/07/2022  AMMARIE MATSUURA 09-21-1981 [id number]  [facility]] Patient: [employer]  Primary Care Provider:  Pcp, No listed [patient states she had a new patient appointment with Dr. Gari [place]] Sadie Haber at [facility] it was for tomorrow 01/08/22]  We have reviewed your referral request and this patient is not showing that the patient is active with a [facility] provider.   Patient states she is to establish care with [facility].  This provider is managed by an external chronic care management team. Patient states she is very weak and doesn't have a disposition yet noted.  Plan: Follow referral    For questions,   Natividad Brood, RN BSN [facility] Liaison  [phone] business mobile phone Toll free office [phone]  Fax number: [phone] [email or web address]

## 2022-01-07 NOTE — Progress Notes (Signed)
ANTICOAGULATION CONSULT NOTE - Initial Consult  Pharmacy Consult for heparin Indication: pulmonary embolus with alveolar hemorrhage  No Active Allergies  Patient Measurements: Height: 5\' 7"  (170.2 cm) Weight:  51.7 kg IBW/kg (Calculated) : 61.6  Vital Signs: Temp: 98.3 F (36.8 C) (02/07 0015) Temp Source: Oral (02/07 0015) BP: 150/106 (02/07 0330) Pulse Rate: 74 (02/07 0330)  Labs: Recent Labs    01/05/22 0005 01/05/22 0006 01/05/22 1953 01/05/22 2000 01/05/22 2136 01/06/22 1937 01/07/22 0132  HGB 8.6*  --  9.1* 9.5*  --   --  8.0*  HCT 27.7*  --  29.8* 28.0*  --   --  25.2*  PLT 416*  --  378  --   --   --  294  CREATININE  --    < > 8.71*  --   --  5.86* 6.46*  TROPONINIHS  --   --  38*  --  41*  --   --    < > = values in this interval not displayed.    Estimated Creatinine Clearance: 9.6 mL/min (A) (by C-G formula based on SCr of 6.46 mg/dL (H)).   Medical History: Past Medical History:  Diagnosis Date   Anasarca associated with disorder of kidney 06/08/2021   Anxiety    Asthma    Bipolar depression ([place])    Depression    Dyspnea    ESRD (end stage renal disease) on dialysis ([place]) 10/2020   GERD (gastroesophageal reflux disease)    Gonorrhea    Lupus (HCC)    Pelvic inflammatory disease (PID)    Pleural effusion 06/08/2021   Renal hypertension    Trichomonas infection     Medications:  Medications Prior to Admission  Medication Sig Dispense Refill Last Dose   acetaminophen (TYLENOL) 500 MG tablet Take 1,000 mg by mouth every 6 (six) hours as needed for moderate pain.   01/05/2022   albuterol (VENTOLIN HFA) 108 (90 Base) MCG/ACT inhaler Inhale 2 puffs into the lungs every 6 (six) hours as needed for wheezing or shortness of breath. 8 g 1 unk   amoxicillin-clavulanate (AUGMENTIN) 500-125 MG tablet Take 1 tablet by mouth See admin instructions. Bid x 5 days   01/05/2022   calcium acetate, Phos Binder, (PHOSLYRA) 667 MG/5ML SOLN Take 1,334 mg by mouth 3  (three) times daily with meals.   01/05/2022   cloNIDine (CATAPRES) 0.3 MG tablet Take 0.3 mg by mouth 3 (three) times daily.   01/05/2022   colchicine 0.6 MG tablet Take 0.5 tablets (0.3 mg total) by mouth daily. 30 tablet 0 01/05/2022   guaiFENesin (MUCINEX) 600 MG 12 hr tablet Take 600 mg by mouth 2 (two) times daily as needed for cough.   01/05/2022   hydroxychloroquine (PLAQUENIL) 200 MG tablet Take 0.5 tablets (100 mg total) by mouth every other day. Take one table after hemodialysis 30 tablet 0 01/04/2022   hydrOXYzine (ATARAX) 25 MG tablet Take 1 tablet (25 mg total) by mouth 3 (three) times daily as needed for itching or anxiety. 30 tablet 0 unk   ipratropium (ATROVENT HFA) 17 MCG/ACT inhaler Inhale 2 puffs into the lungs every 6 (six) hours as needed for wheezing. 1 each 12 01/05/2022   Menthol-Methyl Salicylate (ICY HOT) 76-54 % STCK Apply 1 application topically as needed (pain).   01/05/2022   methocarbamol (ROBAXIN) 500 MG tablet Take 1 tablet (500 mg total) by mouth every 6 (six) hours as needed for muscle spasms. 30 tablet 0 unk   oxyCODONE (  OXY IR/ROXICODONE) 5 MG immediate release tablet Take 1 tablet (5 mg total) by mouth every 6 (six) hours as needed for severe pain. 10 tablet 0 Past Week   predniSONE (DELTASONE) 20 MG tablet Take 2 tablets (40 mg total) by mouth daily with breakfast. 60 tablet 0 01/05/2022   torsemide (DEMADEX) 20 MG tablet Take 20 mg by mouth 2 (two) times daily.   Past Week   amLODipine (NORVASC) 10 MG tablet Take 1 tablet (10 mg total) by mouth daily. (Patient not taking: Reported on 01/06/2022) 30 tablet 3 Not Taking   carvedilol (COREG) 12.5 MG tablet Take 1 tablet (12.5 mg total) by mouth 2 (two) times daily with a meal. (Patient not taking: Reported on 01/06/2022) 60 tablet 3 Not Taking   hydrALAZINE (APRESOLINE) 25 MG tablet Take 1 tablet (25 mg total) by mouth 3 (three) times daily. (Patient not taking: Reported on 01/06/2022) 90 tablet 3 Not Taking   Nutritional Supplements  (,FEEDING SUPPLEMENT, PROSOURCE PLUS) liquid Take 30 mLs by mouth 3 (three) times daily between meals. (Patient not taking: Reported on 01/06/2022) 887 mL 2 Not Taking   Scheduled:   amLODipine  10 mg Oral Daily   aspirin  325 mg Oral Daily   carvedilol  12.5 mg Oral BID WC   Chlorhexidine Gluconate Cloth  6 each Topical Q0600   cloNIDine  0.3 mg Oral TID   colchicine  0.3 mg Oral BID   hydrALAZINE  50 mg Oral TID   hydroxychloroquine  100 mg Oral Q48H   nicotine  21 mg Transdermal Daily   predniSONE  40 mg Oral Q breakfast   torsemide  20 mg Oral BID   Infusions:   nitroGLYCERIN Stopped (01/06/22 2311)    Assessment: 41yo female admitted 2/6 for emergent HD for malignant HTN and hyperkalemia after missed OP HD, now w/ CTA showing PE with area of alveolar hemorrhage; TRH d/w CCM who agree that heparin is appropriate in this pt.  Of note pt had admission 1y ago, was on heparin at rates up to 1200 units/hr though pt's wt at that time was recorded at 85kg, 30kg higher than current.   Goal of Therapy:  Heparin level 0.3-0.5 units/ml Monitor platelets by anticoagulation protocol: Yes   Plan:  Rec'd SQ UFH at 0500; start heparin infusion at 750 units/hr and monitor heparin levels and CBC.  Wynona Neat, PharmD, BCPS  01/07/2022,6:46 AM

## 2022-01-07 NOTE — Significant Event (Signed)
°  I was paged by radiology regarding this patient, who was seen 2/6 by PCCM for HTN, which improved PCCM signed off  Primary team ordered a CTA c/a/p 2/6 to r/o aortic pathology   This CTA resulted with RLL PE, and area concerning for alveolar hemorrhage RLL   Per rad group, they were informed that I am the primary physician following this patient, which I am not. I will reach out to [facility].   PCCM remains signed off. Please reach out if we can be of further assistance    Eliseo Gum MSN, AGACNP-BC [place] for pager  01/07/2022, 6:18 AM

## 2022-01-07 NOTE — Care Management (Addendum)
Spoke w [place] re [facility] banner, PCP assigned at [facility] [employer] per [employer], and [facility] community follow up.  Requested CMA to schedule follow up appointment

## 2022-01-07 NOTE — Progress Notes (Signed)
ANTICOAGULATION CONSULT NOTE  Pharmacy Consult for heparin Indication: pulmonary embolus with alveolar hemorrhage  No Active Allergies  Patient Measurements: Height: 5\' 7"  (170.2 cm) Weight:  51.7 kg IBW/kg (Calculated) : 61.6  Vital Signs: Temp: 98.5 F (36.9 C) (02/07 2015) Temp Source: Oral (02/07 2015) BP: 164/104 (02/07 2000) Pulse Rate: 89 (02/07 2000)  Labs: Recent Labs    01/05/22 0005 01/05/22 0006 01/05/22 1953 01/05/22 2000 01/05/22 2136 01/06/22 1937 01/07/22 0132 01/07/22 1841  HGB 8.6*  --  9.1* 9.5*  --   --  8.0*  --   HCT 27.7*  --  29.8* 28.0*  --   --  25.2*  --   PLT 416*  --  378  --   --   --  294  --   HEPARINUNFRC  --   --   --   --   --   --   --  0.13*  CREATININE  --    < > 8.71*  --   --  5.86* 6.46*  --   TROPONINIHS  --   --  38*  --  41*  --   --   --    < > = values in this interval not displayed.     Estimated Creatinine Clearance: 10.9 mL/min (A) (by C-G formula based on SCr of 6.46 mg/dL (H)).   Assessment: 41yo female admitted 2/6 for emergent HD for malignant HTN and hyperkalemia after missed OP HD, now w/ CTA showing PE with area of alveolar hemorrhage; TRH d/w CCM who agree that heparin is appropriate in this pt.  Of note pt had admission 1y ago, was on heparin at rates up to 1200 units/hr though pt's wt at that time was recorded at 85kg, 30kg higher than current.  Heparin level is subtherapeutic at 0.13 on 750 units/hr. No bleeding or problems with infusion per RN.    Goal of Therapy:  Heparin level 0.3-0.5 units/ml Monitor platelets by anticoagulation protocol: Yes   Plan:  Increase heparin infusion to 900 units/hr  8h heparin level Daily heparin level and CBC Monitor for s/sx of bleeding  Thank you for involving pharmacy in this patient's care.  Renold Genta, PharmD, BCPS Clinical Pharmacist Clinical phone for 01/07/2022 until 10p is [phone] 01/07/2022 8:35 PM  **Pharmacist phone directory can be found on  [email or web address] listed under [place]**

## 2022-01-07 NOTE — Progress Notes (Addendum)
[facility] Progress Note   Subjective:   Patient seen and examined at bedside in room.  Somnolent.  Opens eyes and answers questions but returns quickly to sleep.  Reports improvement in pain and breathing.    Objective Vitals:   01/07/22 0832 01/07/22 0833 01/07/22 0900 01/07/22 1000  BP: (!) 163/129  124/76 120/82  Pulse: 77 72 66 61  Resp:    (!) 23  Temp:      TempSrc:      SpO2: 97% 99% 98% 97%  Weight:      Height:       Physical Exam General:chronically ill appearing somnolent female in NAD Heart:RRR, +rub, no murmur Lungs:scattered rhonchi Abdomen:soft, +distended, NT Extremities:no LE edema Dialysis Access: R IJ [facility]   Filed Weights   01/05/22 1858 01/07/22 0600  Weight: 51.7 kg 51.7 kg    Intake/Output Summary (Last 24 hours) at 01/07/2022 1139 Last data filed at 01/06/2022 2330 Gross per 24 hour  Intake 310.34 ml  Output --  Net 310.34 ml    Additional Objective Labs: Basic Metabolic Panel: Recent Labs  Lab 01/05/22 0006 01/05/22 1953 01/05/22 2000 01/06/22 1937 01/07/22 0132  NA 135 134* 132* 135 133*  K 5.2* 6.0* 6.0* 5.0 5.1  CL 96* 97*  --  97* 96*  CO2 25 23  --  24 27  GLUCOSE 100* 109*  --  106* 88  BUN 83* 80*  --  48* 54*  CREATININE 8.87* 8.71*  --  5.86* 6.46*  CALCIUM 9.2 8.9  --  7.8* 7.7*  PHOS 1.5*  --   --  2.8 2.8   Liver Function Tests: Recent Labs  Lab 01/05/22 0006 01/06/22 1937 01/07/22 0132  AST  --  21  --   ALT  --  21  --   ALKPHOS  --  210*  --   BILITOT  --  0.2*  --   PROT  --  6.1*  --   ALBUMIN 2.4* 2.6* 2.5*   Recent Labs  Lab 01/06/22 1937  LIPASE 42   CBC: Recent Labs  Lab 01/05/22 0005 01/05/22 1953 01/05/22 2000 01/07/22 0132  WBC 11.7* 11.6*  --  8.4  HGB 8.6* 9.1* 9.5* 8.0*  HCT 27.7* 29.8* 28.0* 25.2*  MCV 88.8 90.0  --  89.4  PLT 416* 378  --  294   Blood Culture    Component Value Date/Time   SDES BLOOD LEFT FOREARM 12/27/2021 0023   SPECREQUEST  12/27/2021 0023     BOTTLES DRAWN AEROBIC AND ANAEROBIC Blood Culture results may not be optimal due to an excessive volume of blood received in culture bottles   CULT  12/27/2021 0023    NO GROWTH 5 DAYS Performed at [facility], [place] [address], [place], [place] [address]    REPTSTATUS 01/01/2022 FINAL 12/27/2021 0023    CBG: Recent Labs  Lab 01/05/22 2134 01/06/22 1250  GLUCAP 220* 94   Studies/Results: DG Chest Port 1 View  Result Date: 01/05/2022 CLINICAL DATA:  Dyspnea, missed dialysis EXAM: PORTABLE CHEST 1 VIEW COMPARISON:  12/26/2021 FINDINGS: Moderate to severe cardiomegaly is stable. Pulmonary vascularity is normal. Small bilateral pleural effusions are present, left greater than right. No pneumothorax. No confluent pulmonary infiltrate. Right internal jugular hemodialysis catheter tip noted within the right atrium. No acute bone abnormality. IMPRESSION: Stable cardiomegaly. Small bilateral pleural effusions, left greater than right. Electronically Signed   By: Fidela Salisbury M.D.   On:  01/05/2022 19:52   ECHOCARDIOGRAM COMPLETE  Result Date: 01/06/2022    ECHOCARDIOGRAM REPORT   Patient Name:   LYANN HAGSTROM Date of Exam: 01/06/2022 Medical Rec #:  [id number]        Height:       67.0 in Accession #:    [id number]       Weight:       114.0 lb Date of Birth:  [birth year]       BSA:          1.593 m Patient Age:    41 years         BP:           177/125 mmHg Patient Gender: F                HR:           86 bpm. Exam Location:  Inpatient Procedure: 2D Echo, Cardiac Doppler and Color Doppler Indications:    pericardial effusion  History:        Patient has prior history of Echocardiogram examinations, most                 recent 12/27/2021. Risk Factors:Hypertension. ESRD.  Sonographer:    Melissa Morford RDCS (AE, PE) Referring Phys: [place]  1. Left ventricular ejection fraction, by estimation, is 20 to 25%. The left ventricle has severely decreased function. The left ventricle  demonstrates global hypokinesis. Left ventricular diastolic parameters are consistent with Grade III diastolic dysfunction (restrictive).  2. Right ventricular systolic function is normal. The right ventricular size is normal.  3. Left atrial size was mildly dilated.  4. Right atrial size was mildly dilated.  5. Moderate pericardial effusion. The pericardial effusion is circumferential. There is no evidence of cardiac tamponade.  6. The mitral valve is normal in structure. Mild mitral valve regurgitation. No evidence of mitral stenosis.  7. Tricuspid valve regurgitation is moderate.  8. The aortic valve is normal in structure. Aortic valve regurgitation is mild. No aortic stenosis is present.  9. Pulmonic valve regurgitation is moderate. 10. The inferior vena cava is normal in size with greater than 50% respiratory variability, suggesting right atrial pressure of 3 mmHg. Comparison(s): No significant change from prior study. Prior images reviewed side by side. FINDINGS  Left Ventricle: Left ventricular ejection fraction, by estimation, is 20 to 25%. The left ventricle has severely decreased function. The left ventricle demonstrates global hypokinesis. The left ventricular internal cavity size was normal in size. There is no left ventricular hypertrophy. Left ventricular diastolic parameters are consistent with Grade III diastolic dysfunction (restrictive). Right Ventricle: The right ventricular size is normal. No increase in right ventricular wall thickness. Right ventricular systolic function is normal. Left Atrium: Left atrial size was mildly dilated. Right Atrium: Right atrial size was mildly dilated. Pericardium: A moderately sized pericardial effusion is present. The pericardial effusion is circumferential. There is no evidence of cardiac tamponade. Mitral Valve: The mitral valve is normal in structure. Mild mitral valve regurgitation. No evidence of mitral valve stenosis. Tricuspid Valve: The tricuspid valve  is normal in structure. Tricuspid valve regurgitation is moderate . No evidence of tricuspid stenosis. Aortic Valve: The aortic valve is normal in structure. Aortic valve regurgitation is mild. No aortic stenosis is present. Pulmonic Valve: The pulmonic valve was normal in structure. Pulmonic valve regurgitation is moderate. No evidence of pulmonic stenosis. Aorta: The aortic root is normal in size and structure. Venous: The inferior vena cava  is normal in size with greater than 50% respiratory variability, suggesting right atrial pressure of 3 mmHg. IAS/Shunts: No atrial level shunt detected by color flow Doppler.  LEFT VENTRICLE PLAX 2D LVIDd:         5.17 cm      Diastology LVIDs:         4.62 cm      LV e' medial:    5.77 cm/s LV PW:         1.11 cm      LV E/e' medial:  21.8 LV IVS:        1.16 cm      LV e' lateral:   5.93 cm/s LVOT diam:     2.00 cm      LV E/e' lateral: 21.2 LV SV:         57 LV SV Index:   36 LVOT Area:     3.14 cm  LV Volumes (MOD) LV vol d, MOD A2C: 133.0 ml LV vol d, MOD A4C: 104.5 ml LV vol s, MOD A2C: 110.0 ml LV vol s, MOD A4C: 96.6 ml LV SV MOD A2C:     23.0 ml LV SV MOD A4C:     104.5 ml LV SV MOD BP:      15.0 ml RIGHT VENTRICLE TAPSE (M-mode): 1.6 cm LEFT ATRIUM             Index        RIGHT ATRIUM           Index LA diam:        4.30 cm 2.70 cm/m   RA Area:     19.50 cm LA Vol (A2C):   81.1 ml 50.93 ml/m  RA Volume:   53.30 ml  33.47 ml/m LA Vol (A4C):   69.5 ml 43.64 ml/m LA Biplane Vol: 79.8 ml 50.11 ml/m  AORTIC VALVE LVOT Vmax:   109.00 cm/s LVOT Vmean:  69.500 cm/s LVOT VTI:    0.183 m  AORTA Ao Root diam: 3.10 cm Ao Asc diam:  3.50 cm MITRAL VALVE                TRICUSPID VALVE MV Area (PHT): 6.32 cm     TR Peak grad:   19.7 mmHg MV Decel Time: 120 msec     TR Vmax:        222.00 cm/s MV E velocity: 126.00 cm/s MV A velocity: 68.30 cm/s   SHUNTS MV E/A ratio:  1.84         Systemic VTI:  0.18 m                             Systemic Diam: 2.00 cm Candee Furbish MD  Electronically signed by Candee Furbish MD Signature Date/Time: 01/06/2022/1:57:47 PM    Final    CT Angio Chest/Abd/Pel for Dissection W and/or W/WO  Result Date: 01/07/2022 CLINICAL DATA:  41 year old female with history of chest or back pain. Suspected aortic dissection. EXAM: CT ANGIOGRAPHY CHEST, ABDOMEN AND PELVIS TECHNIQUE: Non-contrast CT of the chest was initially obtained. Multidetector CT imaging through the chest, abdomen and pelvis was performed using the standard protocol during bolus administration of intravenous contrast. Multiplanar reconstructed images and MIPs were obtained and reviewed to evaluate the vascular anatomy. RADIATION DOSE REDUCTION: This exam was performed according to the departmental dose-optimization program which includes automated exposure control, adjustment of the mA and/or kV according to patient  size and/or use of iterative reconstruction technique. CONTRAST:  1mL OMNIPAQUE IOHEXOL 350 MG/ML SOLN COMPARISON:  CT the chest, abdomen and pelvis 12/27/2021. FINDINGS: CTA CHEST FINDINGS Cardiovascular: There is an occlusive segmental sized filling defect in a right lower lobe pulmonary artery branch best appreciated on axial image 92 of series 8. Heart size is mildly enlarged. Moderate to large volume of pericardial fluid. No pericardial calcification. Precontrast images demonstrate no crescentic high attenuation associated with the wall of the thoracic aorta to suggest acute intramural hemorrhage. No evidence of thoracic aortic aneurysm or dissection. No atherosclerotic calcifications are noted in the thoracic aorta or the coronary arteries. Right internal jugular PermCath with tip terminating in the right atrium. Mediastinum/Nodes: No pathologically enlarged mediastinal or hilar lymph nodes. Esophagus is unremarkable in appearance. No axillary lymphadenopathy. Lungs/Pleura: Area of peripheral airspace consolidation in the right lower lobe distal to the occlusive pulmonary  embolus, compatible with alveolar hemorrhage in the setting of acute pulmonary infarction. Extensive passive atelectasis in the left lower lobe and to a lesser extent in the inferior segment of the lingula. Trace right and small left pleural effusions lying dependently. Musculoskeletal: There are no aggressive appearing lytic or blastic lesions noted in the visualized portions of the skeleton. Review of the MIP images confirms the above findings. CTA ABDOMEN AND PELVIS FINDINGS VASCULAR Aorta: Normal caliber aorta without aneurysm, dissection, vasculitis or significant stenosis. Celiac: Patent without evidence of aneurysm, dissection, vasculitis or significant stenosis. SMA: Patent without evidence of aneurysm, dissection, vasculitis or significant stenosis. Renals: Both renal arteries are patent without evidence of aneurysm, dissection, vasculitis, fibromuscular dysplasia or significant stenosis. IMA: Patent without evidence of aneurysm, dissection, vasculitis or significant stenosis. Inflow: Patent without evidence of aneurysm, dissection, vasculitis or significant stenosis. Veins: No obvious venous abnormality within the limitations of this arterial phase study. Review of the MIP images confirms the above findings. NON-VASCULAR Hepatobiliary: No definite suspicious cystic or solid hepatic lesions are confidently identified on today's arterial phase examination. No intra or extrahepatic biliary ductal dilatation. Gallbladder is unremarkable in appearance. Pancreas: No definite pancreatic mass or peripancreatic fluid collections or inflammatory changes are noted on today's arterial phase examination. No pancreatic ductal dilatation. Spleen: Unremarkable. Adrenals/Urinary Tract: Bilateral kidneys and adrenal glands are normal in appearance. No hydroureteronephrosis. Urinary bladder is normal in appearance. Stomach/Bowel: The appearance of the stomach is normal. No pathologic dilatation of small bowel or colon.  Lymphatic: Enlarged left para-aortic lymph node measuring up to 1.5 cm in short axis (axial image 167 of series 8). No other definite lymphadenopathy noted elsewhere in the abdomen or pelvis. Reproductive: Uterus and ovaries are unremarkable in appearance. Bilateral tubal ligation clips are noted. Other: Small to moderate volume of ascites.  No pneumoperitoneum. Musculoskeletal: There are no aggressive appearing lytic or blastic lesions noted in the visualized portions of the skeleton. Review of the MIP images confirms the above findings. IMPRESSION: 1. Study is positive for segmental sized pulmonary embolism in the right lower lobe with area of alveolar hemorrhage in the anterolateral aspect of the right lower lobe. 2. Small left and trace right pleural effusions with some passive atelectasis in the left lung base. 3. Moderate to large pericardial effusion. 4. Small volume of ascites. 5. Enlarged left para-aortic lymph node measuring 1.5 cm in short axis. This is nonspecific, but attention on follow-up studies is recommended. 6. Additional incidental findings, as above. These results will be called to the ordering clinician or representative by the Radiologist Assistant, and communication documented in  the PACS or [employer]. Electronically Signed   By: Vinnie Langton M.D.   On: 01/07/2022 05:58    Medications:  sodium chloride     sodium chloride     heparin 750 Units/hr (01/07/22 0828)   nitroGLYCERIN Stopped (01/06/22 2311)    amLODipine  10 mg Oral Daily   aspirin  325 mg Oral Daily   carvedilol  12.5 mg Oral BID WC   Chlorhexidine Gluconate Cloth  6 each Topical Q0600   Chlorhexidine Gluconate Cloth  6 each Topical Q0600   cloNIDine  0.3 mg Oral TID   colchicine  0.3 mg Oral Daily   hydrALAZINE  50 mg Oral TID   hydroxychloroquine  100 mg Oral Q48H   nicotine  21 mg Transdermal Daily   predniSONE  40 mg Oral Q breakfast   torsemide  20 mg Oral BID    Dialysis Orders: [facility]  4hrs 400/800 52kg 2K 2Ca TDC Hectorol 48mcg qHD No Heparin Mircera 275mcg q2wks - last 01/02/22   Calcium acetate 2AC TID   Assessment/Plan: 1. PE (segmental) w/ RLL pulm infarction - noted on CTA in RLL.  Started on Heparin drip.  2. Pericarditis/pericardial effusion - chronic issue. Per CTA  on 2/6 moderate to large volume. Per PMD 3. Hyperkalemia - K 5.1 today.  Plan for HD today per regular schedule. 4. Malignant hypertension - weaned off nitro drip. Blood pressures improve, continue home meds. Per PMD 5.  ?UTI - reports dysuria, flank pain and decreased UOP. Order UA/UC. 6. Recent PNA- Augmentin started, dose adjusted to appropriate dose for HD patient - 500mg  qd.   7. ESRD - on HD TTS.  HD today per regular schedule.   8. Anemia of CKD- Hgb 8.0. on max ESA, recently dosed.  Check iron studies.  9. Secondary hyperparathyroidism - Calcium at goal.  Continue VDRA. Repeat phos in goal.  Continue to hold binders.  10. Volume - Does not appear volume overloaded. Small pleural effusions noted on CXR.  Under dry weight if weights correct.  UF as tolerated. 11. Nutrition - Renal diet with fluid restrictions.  12. Lupus - on plaquenil and prednisone.  Jen Mow, PA-C [facility] 01/07/2022,11:39 AM  LOS: 1 day   Pt seen, examined and agree w A/P as above. Found to have good-sized PE in R lung w/ some RLL infarction w/ assoc hemorrhage. Started on IV heparin.  [name]  MD 01/07/2022, 7:40 PM

## 2022-01-07 NOTE — Progress Notes (Signed)
PROGRESS NOTE    ANAELLE DUNTON  RJJ:[id number] DOB: 1981-01-01 DOA: 01/05/2022 PCP: Pcp, No    Brief Narrative:  41 year old female, recently admitted to the hospital with right middle lobe pneumonia, pericarditis and pericardial effusion.  She has a history of lupus, end-stage renal disease on hemodialysis, combined CHF with ejection fraction of 25%, presented to the emergency room with chest pain, shortness of breath.  She had missed her last dialysis session.  She was markedly hypertensive.  She was also hyperkalemic and noted to have peaked T waves.  She was started on nitroglycerin infusion and underwent urgent dialysis.  Continued to be hypertensive and was restarted on home medications.  Eventually, nitroglycerin was weaned off.  CT scan of the chest ruled out aortic dissection, but did comment on acute pulmonary embolus.  She has been started on anticoagulation.   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)   Non compliance w medication regimen   Renal hypertension   Pericardial effusion   Hyperkalemia   ESRD on hemodialysis (HCC)   Chronic combined systolic and diastolic congestive heart failure (HCC)   Hypertensive urgency   Acute pulmonary embolus (HCC)   Malignant hypertension -Patient was severely hypertensive on admission -She was restarted on her home medications including Coreg, clonidine, hydralazine, amlodipine -She also required nitroglycerin infusion which has since been weaned off -Overall blood pressures appear to be doing better -She was seen by critical care for hypertensive urgency/emergency, but overall blood pressures have improved  End-stage renal disease on hemodialysis -Patient missed her dialysis treatment prior to admission -She did undergo urgent dialysis on 2/6 and will undergo another treatment on 2/7 since her regular dialysis today -Nephrology  following  Hyperkalemia -Resolved after dialysis  Acute pulmonary embolus -Noted to have right lower lobe pulmonary embolism with associated infarct/hemorrhage -Dr. Hal Hope reviewed CT with PCCM and it was felt reasonable to continue IV heparin -With her underlying connective tissue disease, she may need to be on lifelong therapy  Pericardial effusion/pericarditis -Moderate to large effusion -Suspect this is related to uncontrolled lupus -Echo was repeated and did not show significant change from imaging, no evidence of tamponade -Patient is continued on prednisone and colchicine -Continue to monitor  Chest pain -Likely multifactorial since it was worse with severe hypertension -Now that blood pressure is controlled, appears to be more related to pericarditis.  Symptoms are improved when patient leans forward  Chronic combined CHF -EF 20 to 25% with global hypokinesis and grade 3 diastolic dysfunction -She is on Coreg, torsemide, hydralazine -She is also on amlodipine which would not be ideal with her low EF, but with her refractory hypertension appears to be necessary -She will be follow-up with cardiology for further optimization of her medications  SLE -She reports that she is not being followed by a rheumatologist at present -She is scheduled to see Dr. Benjamine Mola with, rheumatology -Her appointment is scheduled for tomorrow, will send Dr. Benjamine Mola a message that she is currently admitted to the hospital -She is on Plaquenil, prednisone  Diaphoresis Recent pneumonia -Patient was discharged on Augmentin and was due to complete her course prior to admission -She continues to feel diaphoretic and reports that she is coughing -We will resume Augmentin for now -Urinalysis  Anemia of chronic disease -Related to renal disease and lupus -Transfuse as needed   DVT prophylaxis: SCDs Start: 01/06/22 0457  Code Status: Full code Family Communication:  Discussed with  patient Disposition Plan: Status is: Inpatient Remains inpatient appropriate because: Continued management of volume status, anticoagulation for pulmonary embolus             Consultants:  Renal PCCM (signed off)  Procedures:    Antimicrobials:  Augmentin 2/7 >   Subjective: Reports having chest pain and shortness of breath overnight.  This was around midnight when her blood pressure was very high.  Since her blood pressure has improved, her symptoms are better.  Her chest pain is better when she leans forward.  She did experience some numbness around her mouth overnight and correlates this with her high blood pressure.  Since her blood pressure has improved, the numbness has resolved.  She has been having episodes of diaphoresis on and off for the past 3 days.  This is similar to when she was first diagnosed with pneumonia on her last admission.  Objective: Vitals:   01/07/22 1813 01/07/22 1818 01/07/22 2000 01/07/22 2015  BP: (!) 195/126  (!) 164/104   Pulse: 80  89   Resp: (!) 23  18   Temp: 98.8 F (37.1 C)  98.5 F (36.9 C) 98.5 F (36.9 C)  TempSrc: Oral  Oral Oral  SpO2: 100% 100% 100%   Weight: 58.6 kg     Height:        Intake/Output Summary (Last 24 hours) at 01/07/2022 2208 Last data filed at 01/07/2022 1813 Gross per 24 hour  Intake 21.86 ml  Output 2500 ml  Net -2478.14 ml   Filed Weights   01/07/22 0600 01/07/22 1502 01/07/22 1813  Weight: 51.7 kg 61.1 kg 58.6 kg    Examination:  General exam: Appears calm and comfortable  Respiratory system: Clear to auscultation. Respiratory effort normal. Cardiovascular system: S1 & S2 heard, RRR. No JVD, murmurs, rubs, gallops or clicks. No pedal edema. Gastrointestinal system: Abdomen is nondistended, soft and nontender. No organomegaly or masses felt. Normal bowel sounds heard. Central nervous system: Alert and oriented. No focal neurological deficits. Extremities: Symmetric 5 x 5 power. Skin: No  rashes, lesions or ulcers Psychiatry: Judgement and insight appear normal. Mood & affect appropriate.     Data Reviewed: I have personally reviewed following labs and imaging studies  CBC: Recent Labs  Lab 01/05/22 0005 01/05/22 1953 01/05/22 2000 01/07/22 0132  WBC 11.7* 11.6*  --  8.4  HGB 8.6* 9.1* 9.5* 8.0*  HCT 27.7* 29.8* 28.0* 25.2*  MCV 88.8 90.0  --  89.4  PLT 416* 378  --  941   Basic Metabolic Panel: Recent Labs  Lab 01/05/22 0006 01/05/22 1953 01/05/22 2000 01/06/22 1937 01/07/22 0132  NA 135 134* 132* 135 133*  K 5.2* 6.0* 6.0* 5.0 5.1  CL 96* 97*  --  97* 96*  CO2 25 23  --  24 27  GLUCOSE 100* 109*  --  106* 88  BUN 83* 80*  --  48* 54*  CREATININE 8.87* 8.71*  --  5.86* 6.46*  CALCIUM 9.2 8.9  --  7.8* 7.7*  PHOS 1.5*  --   --  2.8 2.8   GFR: Estimated Creatinine Clearance: 10.9 mL/min (A) (by C-G formula based on SCr of 6.46 mg/dL (H)). Liver Function Tests: Recent Labs  Lab 01/05/22 0006 01/06/22 1937 01/07/22 0132  AST  --  21  --   ALT  --  21  --   ALKPHOS  --  210*  --   BILITOT  --  0.2*  --  PROT  --  6.1*  --   ALBUMIN 2.4* 2.6* 2.5*   Recent Labs  Lab 01/06/22 1937  LIPASE 42   No results for input(s): AMMONIA in the last 168 hours. Coagulation Profile: No results for input(s): INR, PROTIME in the last 168 hours. Cardiac Enzymes: No results for input(s): CKTOTAL, CKMB, CKMBINDEX, TROPONINI in the last 168 hours. BNP (last 3 results) No results for input(s): PROBNP in the last 8760 hours. HbA1C: No results for input(s): HGBA1C in the last 72 hours. CBG: Recent Labs  Lab 01/05/22 2134 01/06/22 1250  GLUCAP 220* 94   Lipid Profile: No results for input(s): CHOL, HDL, LDLCALC, TRIG, CHOLHDL, LDLDIRECT in the last 72 hours. Thyroid Function Tests: No results for input(s): TSH, T4TOTAL, FREET4, T3FREE, THYROIDAB in the last 72 hours. Anemia Panel: Recent Labs    01/07/22 1841  FERRITIN 987*  TIBC 277  IRON 46    Sepsis Labs: No results for input(s): PROCALCITON, LATICACIDVEN in the last 168 hours.  Recent Results (from the past 240 hour(s))  Resp Panel by RT-PCR (Flu A&B, Covid) Nasopharyngeal Swab     Status: None   Collection Time: 01/06/22  6:37 AM   Specimen: Nasopharyngeal Swab; Nasopharyngeal(NP) swabs in vial transport medium  Result Value Ref Range Status   SARS Coronavirus 2 by RT PCR NEGATIVE NEGATIVE Final    Comment: (NOTE) SARS-CoV-2 target nucleic acids are NOT DETECTED.  The SARS-CoV-2 RNA is generally detectable in upper respiratory specimens during the acute phase of infection. The lowest concentration of SARS-CoV-2 viral copies this assay can detect is 138 copies/mL. A negative result does not preclude SARS-Cov-2 infection and should not be used as the sole basis for treatment or other patient management decisions. A negative result may occur with  improper specimen collection/handling, submission of specimen other than nasopharyngeal swab, presence of viral mutation(s) within the areas targeted by this assay, and inadequate number of viral copies(<138 copies/mL). A negative result must be combined with clinical observations, patient history, and epidemiological information. The expected result is Negative.  Fact Sheet for Patients:  EntrepreneurPulse.com.au  Fact Sheet for Healthcare Providers:  IncredibleEmployment.be  This test is no t yet approved or cleared by the [place] FDA and  has been authorized for detection and/or diagnosis of SARS-CoV-2 by FDA under an Emergency Use Authorization (EUA). This EUA will remain  in effect (meaning this test can be used) for the duration of the COVID-19 declaration under Section 564(b)(1) of the Act, 21 U.S.C.section 360bbb-3(b)(1), unless the authorization is terminated  or revoked sooner.       Influenza A by PCR NEGATIVE NEGATIVE Final   Influenza B by PCR NEGATIVE NEGATIVE  Final    Comment: (NOTE) The Xpert Xpress SARS-CoV-2/FLU/RSV plus assay is intended as an aid in the diagnosis of influenza from Nasopharyngeal swab specimens and should not be used as a sole basis for treatment. Nasal washings and aspirates are unacceptable for Xpert Xpress SARS-CoV-2/FLU/RSV testing.  Fact Sheet for Patients: EntrepreneurPulse.com.au  Fact Sheet for Healthcare Providers: IncredibleEmployment.be  This test is not yet approved or cleared by the [place] FDA and has been authorized for detection and/or diagnosis of SARS-CoV-2 by FDA under an Emergency Use Authorization (EUA). This EUA will remain in effect (meaning this test can be used) for the duration of the COVID-19 declaration under Section 564(b)(1) of the Act, 21 U.S.C. section 360bbb-3(b)(1), unless the authorization is terminated or revoked.  Performed at [facility], [place]  [address] [place], [place] [address]          Radiology Studies: ECHOCARDIOGRAM COMPLETE  Result Date: 01/06/2022    ECHOCARDIOGRAM REPORT   Patient Name:   APPLE DEARMAS Date of Exam: 01/06/2022 Medical Rec #:  [id number]        Height:       67.0 in Accession #:    [id number]       Weight:       114.0 lb Date of Birth:  1981/04/11       BSA:          1.593 m Patient Age:    23 years         BP:           177/125 mmHg Patient Gender: F                HR:           86 bpm. Exam Location:  Inpatient Procedure: 2D Echo, Cardiac Doppler and Color Doppler Indications:    pericardial effusion  History:        Patient has prior history of Echocardiogram examinations, most                 recent 12/27/2021. Risk Factors:Hypertension. ESRD.  Sonographer:    Melissa Morford RDCS (AE, PE) Referring Phys: [place]  1. Left ventricular ejection fraction, by estimation, is 20 to 25%. The left ventricle has severely decreased function. The left ventricle demonstrates global hypokinesis.  Left ventricular diastolic parameters are consistent with Grade III diastolic dysfunction (restrictive).  2. Right ventricular systolic function is normal. The right ventricular size is normal.  3. Left atrial size was mildly dilated.  4. Right atrial size was mildly dilated.  5. Moderate pericardial effusion. The pericardial effusion is circumferential. There is no evidence of cardiac tamponade.  6. The mitral valve is normal in structure. Mild mitral valve regurgitation. No evidence of mitral stenosis.  7. Tricuspid valve regurgitation is moderate.  8. The aortic valve is normal in structure. Aortic valve regurgitation is mild. No aortic stenosis is present.  9. Pulmonic valve regurgitation is moderate. 10. The inferior vena cava is normal in size with greater than 50% respiratory variability, suggesting right atrial pressure of 3 mmHg. Comparison(s): No significant change from prior study. Prior images reviewed side by side. FINDINGS  Left Ventricle: Left ventricular ejection fraction, by estimation, is 20 to 25%. The left ventricle has severely decreased function. The left ventricle demonstrates global hypokinesis. The left ventricular internal cavity size was normal in size. There is no left ventricular hypertrophy. Left ventricular diastolic parameters are consistent with Grade III diastolic dysfunction (restrictive). Right Ventricle: The right ventricular size is normal. No increase in right ventricular wall thickness. Right ventricular systolic function is normal. Left Atrium: Left atrial size was mildly dilated. Right Atrium: Right atrial size was mildly dilated. Pericardium: A moderately sized pericardial effusion is present. The pericardial effusion is circumferential. There is no evidence of cardiac tamponade. Mitral Valve: The mitral valve is normal in structure. Mild mitral valve regurgitation. No evidence of mitral valve stenosis. Tricuspid Valve: The tricuspid valve is normal in structure. Tricuspid  valve regurgitation is moderate . No evidence of tricuspid stenosis. Aortic Valve: The aortic valve is normal in structure. Aortic valve regurgitation is mild. No aortic stenosis is present. Pulmonic Valve: The pulmonic valve was normal in structure. Pulmonic valve regurgitation is moderate. No evidence of pulmonic stenosis. Aorta: The aortic  root is normal in size and structure. Venous: The inferior vena cava is normal in size with greater than 50% respiratory variability, suggesting right atrial pressure of 3 mmHg. IAS/Shunts: No atrial level shunt detected by color flow Doppler.  LEFT VENTRICLE PLAX 2D LVIDd:         5.17 cm      Diastology LVIDs:         4.62 cm      LV e' medial:    5.77 cm/s LV PW:         1.11 cm      LV E/e' medial:  21.8 LV IVS:        1.16 cm      LV e' lateral:   5.93 cm/s LVOT diam:     2.00 cm      LV E/e' lateral: 21.2 LV SV:         57 LV SV Index:   36 LVOT Area:     3.14 cm  LV Volumes (MOD) LV vol d, MOD A2C: 133.0 ml LV vol d, MOD A4C: 104.5 ml LV vol s, MOD A2C: 110.0 ml LV vol s, MOD A4C: 96.6 ml LV SV MOD A2C:     23.0 ml LV SV MOD A4C:     104.5 ml LV SV MOD BP:      15.0 ml RIGHT VENTRICLE TAPSE (M-mode): 1.6 cm LEFT ATRIUM             Index        RIGHT ATRIUM           Index LA diam:        4.30 cm 2.70 cm/m   RA Area:     19.50 cm LA Vol (A2C):   81.1 ml 50.93 ml/m  RA Volume:   53.30 ml  33.47 ml/m LA Vol (A4C):   69.5 ml 43.64 ml/m LA Biplane Vol: 79.8 ml 50.11 ml/m  AORTIC VALVE LVOT Vmax:   109.00 cm/s LVOT Vmean:  69.500 cm/s LVOT VTI:    0.183 m  AORTA Ao Root diam: 3.10 cm Ao Asc diam:  3.50 cm MITRAL VALVE                TRICUSPID VALVE MV Area (PHT): 6.32 cm     TR Peak grad:   19.7 mmHg MV Decel Time: 120 msec     TR Vmax:        222.00 cm/s MV E velocity: 126.00 cm/s MV A velocity: 68.30 cm/s   SHUNTS MV E/A ratio:  1.84         Systemic VTI:  0.18 m                             Systemic Diam: 2.00 cm Candee Furbish MD Electronically signed by Candee Furbish MD  Signature Date/Time: 01/06/2022/1:57:47 PM    Final    CT Angio Chest/Abd/Pel for Dissection W and/or W/WO  Result Date: 01/07/2022 CLINICAL DATA:  41 year old female with history of chest or back pain. Suspected aortic dissection. EXAM: CT ANGIOGRAPHY CHEST, ABDOMEN AND PELVIS TECHNIQUE: Non-contrast CT of the chest was initially obtained. Multidetector CT imaging through the chest, abdomen and pelvis was performed using the standard protocol during bolus administration of intravenous contrast. Multiplanar reconstructed images and MIPs were obtained and reviewed to evaluate the vascular anatomy. RADIATION DOSE REDUCTION: This exam was performed according to the departmental dose-optimization program which includes  automated exposure control, adjustment of the mA and/or kV according to patient size and/or use of iterative reconstruction technique. CONTRAST:  67mL OMNIPAQUE IOHEXOL 350 MG/ML SOLN COMPARISON:  CT the chest, abdomen and pelvis 12/27/2021. FINDINGS: CTA CHEST FINDINGS Cardiovascular: There is an occlusive segmental sized filling defect in a right lower lobe pulmonary artery branch best appreciated on axial image 92 of series 8. Heart size is mildly enlarged. Moderate to large volume of pericardial fluid. No pericardial calcification. Precontrast images demonstrate no crescentic high attenuation associated with the wall of the thoracic aorta to suggest acute intramural hemorrhage. No evidence of thoracic aortic aneurysm or dissection. No atherosclerotic calcifications are noted in the thoracic aorta or the coronary arteries. Right internal jugular PermCath with tip terminating in the right atrium. Mediastinum/Nodes: No pathologically enlarged mediastinal or hilar lymph nodes. Esophagus is unremarkable in appearance. No axillary lymphadenopathy. Lungs/Pleura: Area of peripheral airspace consolidation in the right lower lobe distal to the occlusive pulmonary embolus, compatible with alveolar hemorrhage in  the setting of acute pulmonary infarction. Extensive passive atelectasis in the left lower lobe and to a lesser extent in the inferior segment of the lingula. Trace right and small left pleural effusions lying dependently. Musculoskeletal: There are no aggressive appearing lytic or blastic lesions noted in the visualized portions of the skeleton. Review of the MIP images confirms the above findings. CTA ABDOMEN AND PELVIS FINDINGS VASCULAR Aorta: Normal caliber aorta without aneurysm, dissection, vasculitis or significant stenosis. Celiac: Patent without evidence of aneurysm, dissection, vasculitis or significant stenosis. SMA: Patent without evidence of aneurysm, dissection, vasculitis or significant stenosis. Renals: Both renal arteries are patent without evidence of aneurysm, dissection, vasculitis, fibromuscular dysplasia or significant stenosis. IMA: Patent without evidence of aneurysm, dissection, vasculitis or significant stenosis. Inflow: Patent without evidence of aneurysm, dissection, vasculitis or significant stenosis. Veins: No obvious venous abnormality within the limitations of this arterial phase study. Review of the MIP images confirms the above findings. NON-VASCULAR Hepatobiliary: No definite suspicious cystic or solid hepatic lesions are confidently identified on today's arterial phase examination. No intra or extrahepatic biliary ductal dilatation. Gallbladder is unremarkable in appearance. Pancreas: No definite pancreatic mass or peripancreatic fluid collections or inflammatory changes are noted on today's arterial phase examination. No pancreatic ductal dilatation. Spleen: Unremarkable. Adrenals/Urinary Tract: Bilateral kidneys and adrenal glands are normal in appearance. No hydroureteronephrosis. Urinary bladder is normal in appearance. Stomach/Bowel: The appearance of the stomach is normal. No pathologic dilatation of small bowel or colon. Lymphatic: Enlarged left para-aortic lymph node  measuring up to 1.5 cm in short axis (axial image 167 of series 8). No other definite lymphadenopathy noted elsewhere in the abdomen or pelvis. Reproductive: Uterus and ovaries are unremarkable in appearance. Bilateral tubal ligation clips are noted. Other: Small to moderate volume of ascites.  No pneumoperitoneum. Musculoskeletal: There are no aggressive appearing lytic or blastic lesions noted in the visualized portions of the skeleton. Review of the MIP images confirms the above findings. IMPRESSION: 1. Study is positive for segmental sized pulmonary embolism in the right lower lobe with area of alveolar hemorrhage in the anterolateral aspect of the right lower lobe. 2. Small left and trace right pleural effusions with some passive atelectasis in the left lung base. 3. Moderate to large pericardial effusion. 4. Small volume of ascites. 5. Enlarged left para-aortic lymph node measuring 1.5 cm in short axis. This is nonspecific, but attention on follow-up studies is recommended. 6. Additional incidental findings, as above. These results will be called to the  ordering clinician or representative by the Radiologist Assistant, and communication documented in the PACS or [employer]. Electronically Signed   By: Vinnie Langton M.D.   On: 01/07/2022 05:58        Scheduled Meds:  amLODipine  10 mg Oral Daily   amoxicillin-clavulanate  1 tablet Oral QHS   aspirin  325 mg Oral Daily   carvedilol  12.5 mg Oral BID WC   Chlorhexidine Gluconate Cloth  6 each Topical Q0600   Chlorhexidine Gluconate Cloth  6 each Topical Q0600   cloNIDine  0.3 mg Oral TID   colchicine  0.3 mg Oral Daily   hydrALAZINE  50 mg Oral TID   hydroxychloroquine  100 mg Oral Q48H   nicotine  21 mg Transdermal Daily   predniSONE  40 mg Oral Q breakfast   torsemide  20 mg Oral BID   Continuous Infusions:  sodium chloride     sodium chloride     heparin 900 Units/hr (01/07/22 2130)     LOS: 1 day    Time spent:  22mins    Kathie Dike, MD [facility]   If 7PM-7AM, please contact night-coverage [email or web address]  01/07/2022, 10:08 PM

## 2022-01-07 NOTE — Significant Event (Signed)
I was notified about patient's CT angiogram result which shows pulm embolism and also an area of alveolar hemorrhage.  Also noted is pericardial effusion.  Patient did have a 2D echo which showed moderate pericardial effusion with no cardiac tamponade.  I did discuss with pulmonologist Dr. Patsey Berthold about starting heparin in the setting of PE with alveolar hemorrhage.  Dr. Patsey Berthold advises okay to start heparin for the PE.  Gean Birchwood

## 2022-01-07 NOTE — Plan of Care (Signed)

## 2022-01-08 ENCOUNTER — Other Ambulatory Visit (HOSPITAL_COMMUNITY): Payer: Self-pay

## 2022-01-08 ENCOUNTER — Ambulatory Visit: Payer: Medicare Other | Admitting: Internal Medicine

## 2022-01-08 ENCOUNTER — Encounter: Payer: Self-pay | Admitting: Hematology and Oncology

## 2022-01-08 LAB — CBC
HCT: 26.1 % — ABNORMAL LOW (ref 36.0–46.0)
Hemoglobin: 8.2 g/dL — ABNORMAL LOW (ref 12.0–15.0)
MCH: 27.7 pg (ref 26.0–34.0)
MCHC: 31.4 g/dL (ref 30.0–36.0)
MCV: 88.2 fL (ref 80.0–100.0)
Platelets: 333 10*3/uL (ref 150–400)
RBC: 2.96 MIL/uL — ABNORMAL LOW (ref 3.87–5.11)
RDW: 17.1 % — ABNORMAL HIGH (ref 11.5–15.5)
WBC: 9 10*3/uL (ref 4.0–10.5)
nRBC: 0.4 % — ABNORMAL HIGH (ref 0.0–0.2)

## 2022-01-08 LAB — RENAL FUNCTION PANEL
Albumin: 2.4 g/dL — ABNORMAL LOW (ref 3.5–5.0)
Anion gap: 13 (ref 5–15)
BUN: 35 mg/dL — ABNORMAL HIGH (ref 6–20)
CO2: 25 mmol/L (ref 22–32)
Calcium: 8.3 mg/dL — ABNORMAL LOW (ref 8.9–10.3)
Chloride: 97 mmol/L — ABNORMAL LOW (ref 98–111)
Creatinine, Ser: 4.54 mg/dL — ABNORMAL HIGH (ref 0.44–1.00)
GFR, Estimated: 12 mL/min — ABNORMAL LOW (ref >60)
Glucose, Bld: 105 mg/dL — ABNORMAL HIGH (ref 70–99)
Phosphorus: 2.5 mg/dL (ref 2.5–4.6)
Potassium: 4.3 mmol/L (ref 3.5–5.1)
Sodium: 135 mmol/L (ref 135–145)

## 2022-01-08 LAB — HEPARIN LEVEL (UNFRACTIONATED): Heparin Unfractionated: <0.1 IU/mL — ABNORMAL LOW (ref 0.30–0.70)

## 2022-01-08 LAB — GLUCOSE, CAPILLARY: Glucose-Capillary: 132 mg/dL — ABNORMAL HIGH (ref 70–99)

## 2022-01-08 MED ORDER — SODIUM CHLORIDE 0.9 % IV SOLN
125.0000 mg | INTRAVENOUS | Status: DC
  Filled 2022-01-08: qty 10

## 2022-01-08 MED ORDER — APIXABAN 5 MG PO TABS: 5.0000 mg | ORAL_TABLET | Freq: Two times a day (BID) | ORAL | Status: DC

## 2022-01-08 MED ORDER — CHLORHEXIDINE GLUCONATE CLOTH 2 % EX PADS: 6.0000 | MEDICATED_PAD | Freq: Every day | CUTANEOUS | Status: DC

## 2022-01-08 MED ORDER — DM-GUAIFENESIN ER 30-600 MG PO TB12
1.0000 | ORAL_TABLET | Freq: Once | ORAL | Status: AC
  Administered 2022-01-08: 1 via ORAL
  Filled 2022-01-08: qty 1

## 2022-01-08 MED ORDER — APIXABAN (ELIQUIS) EDUCATION KIT FOR DVT/PE PATIENTS
PACK | Freq: Once | Status: AC
  Filled 2022-01-08: qty 1

## 2022-01-08 MED ORDER — APIXABAN 5 MG PO TABS
10.0000 mg | ORAL_TABLET | Freq: Two times a day (BID) | ORAL | Status: DC
  Administered 2022-01-08 – 2022-01-09 (×3): 10 mg via ORAL
  Filled 2022-01-08 (×3): qty 2

## 2022-01-08 NOTE — Assessment & Plan Note (Addendum)
CT angiogram showed acute PE.  Described as segmental sized pulmonary embolism in the right lower lobe with area of alveolar hemorrhage in the anterolateral aspect of the right lower lobe.  Currently on heparin drip.We will start on  Eliquis

## 2022-01-08 NOTE — Hospital Course (Signed)
41 year old female, recently admitted to the hospital with right middle lobe pneumonia, pericarditis and pericardial effusion.  She has a history of lupus, end-stage renal disease on hemodialysis, combined CHF with ejection fraction of 25%, presented to the emergency room with chest pain, shortness of breath.  She had missed her last dialysis session.  She was markedly hypertensive.  She was also hyperkalemic and noted to have peaked T waves.  She was started on nitroglycerin infusion and underwent urgent dialysis.  Continued to be hypertensive and was restarted on home medications.  Eventually, nitroglycerin was weaned off.  CT scan of the chest ruled out aortic dissection, but did comment on acute pulmonary embolus.  She has been started on anticoagulation, currently on IV heparin.

## 2022-01-08 NOTE — Progress Notes (Signed)
[facility] Progress Note   Subjective:   Patient seen and examined at bedside.  States pain woke her in the middle of the night with cold sweats.  But now improved.  Tolerated breakfast.  Overall feeling a little better.   Objective Vitals:   01/08/22 0332 01/08/22 0601 01/08/22 0823 01/08/22 1222  BP: (!) 156/101  (!) 175/115 (!) 144/106  Pulse: 75 75    Resp: 20 14    Temp: 98.6 F (37 C)  98.5 F (36.9 C) 98.9 F (37.2 C)  TempSrc: Oral  Oral Oral  SpO2: 99% 100%    Weight:  58.4 kg    Height:       Physical Exam General:chronically ill appearing female in NAD Heart:RRR, +rub Lungs:mostly CTAB, nml WOB Abdomen:soft, distended, non tender Extremities:no LE edema Dialysis Access: [facility]   Filed Weights   01/07/22 1502 01/07/22 1813 01/08/22 0601  Weight: 61.1 kg 58.6 kg 58.4 kg    Intake/Output Summary (Last 24 hours) at 01/08/2022 1358 Last data filed at 01/08/2022 9390 Gross per 24 hour  Intake 314.76 ml  Output 2500 ml  Net -2185.24 ml    Additional Objective Labs: Basic Metabolic Panel: Recent Labs  Lab 01/06/22 1937 01/07/22 0132 01/08/22 0444  NA 135 133* 135  K 5.0 5.1 4.3  CL 97* 96* 97*  CO2 24 27 25   GLUCOSE 106* 88 105*  BUN 48* 54* 35*  CREATININE 5.86* 6.46* 4.54*  CALCIUM 7.8* 7.7* 8.3*  PHOS 2.8 2.8 2.5   Liver Function Tests: Recent Labs  Lab 01/06/22 1937 01/07/22 0132 01/08/22 0444  AST 21  --   --   ALT 21  --   --   ALKPHOS 210*  --   --   BILITOT 0.2*  --   --   PROT 6.1*  --   --   ALBUMIN 2.6* 2.5* 2.4*   Recent Labs  Lab 01/06/22 1937  LIPASE 42   CBC: Recent Labs  Lab 01/05/22 0005 01/05/22 1953 01/05/22 2000 01/07/22 0132 01/08/22 0444  WBC 11.7* 11.6*  --  8.4 9.0  HGB 8.6* 9.1* 9.5* 8.0* 8.2*  HCT 27.7* 29.8* 28.0* 25.2* 26.1*  MCV 88.8 90.0  --  89.4 88.2  PLT 416* 378  --  294 333   CBG: Recent Labs  Lab 01/05/22 2134 01/06/22 1250 01/07/22 2241  GLUCAP 220* 94 127*   Iron  Studies:  Recent Labs    01/07/22 1841  IRON 46  TIBC 277  FERRITIN 987*   Lab Results  Component Value Date   INR 1.2 12/27/2021   INR 1.2 12/13/2021   INR 1.0 06/08/2021   Studies/Results: CT Angio Chest/Abd/Pel for Dissection W and/or W/WO  Result Date: 01/07/2022 CLINICAL DATA:  41 year old female with history of chest or back pain. Suspected aortic dissection. EXAM: CT ANGIOGRAPHY CHEST, ABDOMEN AND PELVIS TECHNIQUE: Non-contrast CT of the chest was initially obtained. Multidetector CT imaging through the chest, abdomen and pelvis was performed using the standard protocol during bolus administration of intravenous contrast. Multiplanar reconstructed images and MIPs were obtained and reviewed to evaluate the vascular anatomy. RADIATION DOSE REDUCTION: This exam was performed according to the departmental dose-optimization program which includes automated exposure control, adjustment of the mA and/or kV according to patient size and/or use of iterative reconstruction technique. CONTRAST:  33mL OMNIPAQUE IOHEXOL 350 MG/ML SOLN COMPARISON:  CT the chest, abdomen and pelvis 12/27/2021. FINDINGS: CTA CHEST FINDINGS Cardiovascular: There is an  occlusive segmental sized filling defect in a right lower lobe pulmonary artery branch best appreciated on axial image 92 of series 8. Heart size is mildly enlarged. Moderate to large volume of pericardial fluid. No pericardial calcification. Precontrast images demonstrate no crescentic high attenuation associated with the wall of the thoracic aorta to suggest acute intramural hemorrhage. No evidence of thoracic aortic aneurysm or dissection. No atherosclerotic calcifications are noted in the thoracic aorta or the coronary arteries. Right internal jugular PermCath with tip terminating in the right atrium. Mediastinum/Nodes: No pathologically enlarged mediastinal or hilar lymph nodes. Esophagus is unremarkable in appearance. No axillary lymphadenopathy.  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 Extensive passive atelectasis in the left lower lobe and to a lesser extent in the inferior segment of the lingula. Trace right and small left pleural effusions lying dependently. Musculoskeletal: There are no aggressive appearing lytic or blastic lesions noted in the visualized portions of the skeleton. Review of the MIP images confirms the above findings. CTA ABDOMEN AND PELVIS FINDINGS VASCULAR Aorta: Normal caliber aorta without aneurysm, dissection, vasculitis or significant stenosis. Celiac: Patent without evidence of aneurysm, dissection, vasculitis or significant stenosis. SMA: Patent without evidence of aneurysm, dissection, vasculitis or significant stenosis. Renals: Both renal arteries are patent without evidence of aneurysm, dissection, vasculitis, fibromuscular dysplasia or significant stenosis. IMA: Patent without evidence of aneurysm, dissection, vasculitis or significant stenosis. Inflow: Patent without evidence of aneurysm, dissection, vasculitis or significant stenosis. Veins: No obvious venous abnormality within the limitations of this arterial phase study. Review of the MIP images confirms the above findings. NON-VASCULAR Hepatobiliary: No definite suspicious cystic or solid hepatic lesions are confidently identified on today's arterial phase examination. No intra or extrahepatic biliary ductal dilatation. Gallbladder is unremarkable in appearance. Pancreas: No definite pancreatic mass or peripancreatic fluid collections or inflammatory changes are noted on today's arterial phase examination. No pancreatic ductal dilatation. Spleen: Unremarkable. Adrenals/Urinary Tract: Bilateral kidneys and adrenal glands are normal in appearance. No hydroureteronephrosis. Urinary bladder is normal in  appearance. Stomach/Bowel: The appearance of the stomach is normal. No pathologic dilatation of small bowel or colon. Lymphatic: Enlarged left para-aortic lymph node measuring up to 1.5 cm in short axis (axial image 167 of series 8). No other definite lymphadenopathy noted elsewhere in the abdomen or pelvis. Reproductive: Uterus and ovaries are unremarkable in appearance. Bilateral tubal ligation clips are noted. Other: Small to moderate volume of ascites.  No pneumoperitoneum. Musculoskeletal: There are no aggressive appearing lytic or blastic lesions noted in the visualized portions of the skeleton. Review of the MIP images confirms the above findings. IMPRESSION: 1. Study is positive for segmental sized pulmonary embolism in the right lower lobe with area of alveolar hemorrhage in the anterolateral aspect of the right lower lobe. 2. Small left and trace right pleural effusions with some passive atelectasis in the left lung base. 3. Moderate to large pericardial effusion. 4. Small volume of ascites. 5. Enlarged left para-aortic lymph node measuring 1.5 cm in short axis. This is nonspecific, but attention on follow-up studies is recommended. 6. Additional incidental findings, as above. These results will be called to the ordering clinician or representative by the Radiologist Assistant, and communication documented in the PACS or [employer]. Electronically Signed   By: Vinnie Langton M.D.   On: 01/07/2022 05:58    Medications:  sodium chloride     sodium chloride  heparin 1,100 Units/hr (01/08/22 0653)    amLODipine  10 mg Oral Daily   amoxicillin-clavulanate  1 tablet Oral QHS   aspirin  325 mg Oral Daily   carvedilol  12.5 mg Oral BID WC   Chlorhexidine Gluconate Cloth  6 each Topical Q0600   Chlorhexidine Gluconate Cloth  6 each Topical Q0600   cloNIDine  0.3 mg Oral TID   colchicine  0.3 mg Oral Daily   hydrALAZINE  50 mg Oral TID   hydroxychloroquine  100 mg Oral Q48H   nicotine  21  mg Transdermal Daily   predniSONE  40 mg Oral Q breakfast   torsemide  20 mg Oral BID    Dialysis Orders: [facility] 400/800 52kg 2K 2Ca TDC Hectorol 37mcg qHD No Heparin Mircera 259mcg q2wks - last 01/02/22   Calcium acetate 2AC TID   Assessment/Plan: 1. PE (segmental) w/ RLL pulm infarction - noted on CTA in RLL.  Started on Heparin drip. To transition to eliquis. 2. Pericarditis/pericardial effusion - chronic issue. Per CTA  on 2/6 moderate to large volume. Per PMD 3. Hyperkalemia - resolved with HD. K 4.3 4. Malignant hypertension - BP variable, continue home meds. Per PMD 5.  ?UTI - symptoms resolved. 6. Recent PNA- Augmentin started - 500mg  qd.   7. ESRD - on HD TTS.  HD today per regular schedule.   8. Anemia of CKD- Hgb 8.2. on max ESA, recently dosed.  tsat 17%, start iron load. 9. Secondary hyperparathyroidism - Calcium at goal.  Continue VDRA. Repeat phos in goal.  Continue to hold binders.  10. Volume - Does not appear volume overloaded. Small pleural effusions noted on CXR.  Under dry weight if weights correct.  UF as tolerated. 11. Nutrition - Renal diet with fluid restrictions.  12. Lupus - on plaquenil and prednisone.  Jen Mow, PA-C [facility] 01/08/2022,1:58 PM  LOS: 2 days

## 2022-01-08 NOTE — Progress Notes (Signed)
ANTICOAGULATION CONSULT NOTE - Follow Up Consult  Pharmacy Consult for heparin Indication:  pulmonary embolus with alveolar hemorrhage  Labs: Recent Labs    01/05/22 1953 01/05/22 2000 01/05/22 2136 01/06/22 1937 01/07/22 0132 01/07/22 1841 01/08/22 0444  HGB 9.1* 9.5*  --   --  8.0*  --  8.2*  HCT 29.8* 28.0*  --   --  25.2*  --  26.1*  PLT 378  --   --   --  294  --  333  HEPARINUNFRC  --   --   --   --   --  0.13* <0.10*  CREATININE 8.71*  --   --  5.86* 6.46*  --  4.54*  TROPONINIHS 38*  --  41*  --   --   --   --     Assessment: 40yo female subtherapeutic on heparin with lower heparin level despite increased rate; no infusion issues or signs of bleeding per RN.  Goal of Therapy:  Heparin level 0.3-0.5 units/ml   Plan:  Will increase heparin infusion by 4 units/kg/hr to 1100 units/hr and check level in 8 hours.    Wynona Neat, PharmD, BCPS  01/08/2022,5:55 AM

## 2022-01-08 NOTE — TOC Benefit Eligibility Note (Signed)
Patient Teacher, English as a foreign language completed.    The patient is currently admitted and upon discharge could be taking Eliquis 5 mg.  The current 30 day co-pay is, $0.00.   The patient is insured through [place] [place] Patient Advocate Specialist [place] Patient Advocate Team Direct Number: [phone]  Fax: [phone]

## 2022-01-08 NOTE — Discharge Instructions (Signed)
Information on my medicine - ELIQUIS (apixaban)  This medication education was reviewed with me or my healthcare representative as part of my discharge preparation.   Why was Eliquis prescribed for you? Eliquis was prescribed to treat blood clots that may have been found in the veins of your legs (deep vein thrombosis) or in your lungs (pulmonary embolism) and to reduce the risk of them occurring again.  What do You need to know about Eliquis ? The starting dose is 10 mg (two 5 mg tablets) taken TWICE daily for the FIRST SEVEN (7) DAYS, then on  01/15/2022  the dose is reduced to ONE 5 mg tablet taken TWICE daily.  Eliquis may be taken with or without food.   Try to take the dose about the same time in the morning and in the evening. If you have difficulty swallowing the tablet whole please discuss with your pharmacist how to take the medication safely.  Take Eliquis exactly as prescribed and DO NOT stop taking Eliquis without talking to the doctor who prescribed the medication.  Stopping may increase your risk of developing a new blood clot.  Refill your prescription before you run out.  After discharge, you should have regular check-up appointments with your healthcare provider that is prescribing your Eliquis.    What do you do if you miss a dose? If a dose of ELIQUIS is not taken at the scheduled time, take it as soon as possible on the same day and twice-daily administration should be resumed. The dose should not be doubled to make up for a missed dose.  Important Safety Information A possible side effect of Eliquis is bleeding. You should call your healthcare provider right away if you experience any of the following: Bleeding from an injury or your nose that does not stop. Unusual colored urine (red or dark brown) or unusual colored stools (red or black). Unusual bruising for unknown reasons. A serious fall or if you hit your head (even if there is no bleeding).  Some  medicines may interact with Eliquis and might increase your risk of bleeding or clotting while on Eliquis. To help avoid this, consult your healthcare provider or pharmacist prior to using any new prescription or non-prescription medications, including herbals, vitamins, non-steroidal anti-inflammatory drugs (NSAIDs) and supplements.  This website has more information on Eliquis (apixaban): http://www.eliquis.com/eliquis/home

## 2022-01-08 NOTE — Assessment & Plan Note (Signed)
Patient was recently discharged on Augmentin, she is due to complete her course prior to admission.  She has some cough.  Augmentin has been resumed.

## 2022-01-08 NOTE — Progress Notes (Signed)
ANTICOAGULATION CONSULT NOTE - Follow Up Consult  Pharmacy Consult for heparin >Transition to oral Eliquis Indication: acute  pulmonary embolus with alveolar hemorrhage  Labs: Recent Labs    01/05/22 1953 01/05/22 2000 01/05/22 2136 01/06/22 1937 01/07/22 0132 01/07/22 1841 01/08/22 0444  HGB 9.1* 9.5*  --   --  8.0*  --  8.2*  HCT 29.8* 28.0*  --   --  25.2*  --  26.1*  PLT 378  --   --   --  294  --  333  HEPARINUNFRC  --   --   --   --   --  0.13* <0.10*  CREATININE 8.71*  --   --  5.86* 6.46*  --  4.54*  TROPONINIHS 38*  --  41*  --   --   --   --      Assessment: 40yo female subtherapeutic on heparin with lower heparin level despite increased rate; no infusion issues or signs of bleeding per RN.  Now pharmacy consulted to transition from IV heparin to oral anticoagulation with Eliquis for acute PE.  Note PE with area of alvelor hemorrhage.  Last night patient reported  some blood on toilet paper, none in stool - patient thinks hemorrhoid.   This AM,  Hgb 8s stable (noted 7-8s during last admission), pltc wnl stable and no bleeding noted since last night.     Plan:  Discontinue IV heparin drip now and start Eliquis  10 mg bid x 7 days then on 01/15/22 reduce to 5 mg BID for acute PE  with area of alvelor hemorrhage. Monitor closely for s/sx of bleeding  Co-pay Eliquis: $0.00  Will educate patient about Eliquis prior to discharge.  Thank you for allowing pharmacy to be part of this patients care team. Nicole Cella, [place] Pharmacist [phone] 01/08/2022,2:40 PM  Please check AMION for all [facility] phone numbers After 10:00 PM, call [place] [phone]

## 2022-01-08 NOTE — Progress Notes (Signed)
Progress Note   Patient: Cathy [name] LDJ:[id number] DOB: [birth year] DOA: 01/05/2022     2 DOS: the patient was seen and examined on 01/08/2022   Brief hospital course: 41 year old female, recently admitted to the hospital with right middle lobe pneumonia, pericarditis and pericardial effusion.  She has a history of lupus, end-stage renal disease on hemodialysis, combined CHF with ejection fraction of 25%, presented to the emergency room with chest pain, shortness of breath.  She had missed her last dialysis session.  She was markedly hypertensive.  She was also hyperkalemic and noted to have peaked T waves.  She was started on nitroglycerin infusion and underwent urgent dialysis.  Continued to be hypertensive and was restarted on home medications.  Eventually, nitroglycerin was weaned off.  CT scan of the chest ruled out aortic dissection, but did comment on acute pulmonary embolus.  She has been started on anticoagulation, currently on IV heparin.  Assessment and Plan: * Malignant hypertension- (present on admission) Initially started on NTG gtts. Drip has been stopped now.  She was on Coreg, clonidine, hydralazine, amlodipine at home.  Restarted this medications with improvement in the blood pressure.  Acute pulmonary embolus ([place])- (present on admission) CT angiogram showed acute PE.  Described as segmental sized pulmonary embolism in the right lower lobe with area of alveolar hemorrhage in the anterolateral aspect of the right lower lobe.  Currently on heparin drip.We will start on  Eliquis   Chronic combined systolic and diastolic congestive heart failure ([place])- (present on admission) Appears euvolemic.  Volume management as per dialysis. Echo has shown EF of 20 to 25%, global hypokinesis, grade 3 diastolic dysfunction.  On Coreg, torsemide, hydralazine.  She needs to follow-up with cardiology as an outpatient.  PNA (pneumonia) Patient was recently discharged on Augmentin, she is due to  complete her course prior to admission.  She has some cough.  Augmentin has been resumed.  ESRD on hemodialysis [facility]) Received emergent HD tonight due to hyperkalemia. Nephrology following and undergoing dialysis.  Hyperkalemia has resolved.  Hyperkalemia- (present on admission) S/p emergent HD tonight.  Pericardial effusion- (present on admission) Moderate to large pleural effusion is seen on the CT imaging.  This is a chronic finding.  Likely from uncontrolled lupus.  Echo was repeated here and did not show significant change from imaging no evidence of tamponade.  Plan is to continue monitoring.  Renal hypertension- (present on admission) Chronic. Doesn't take her meds.  Continue current medications  Non compliance w medication regimen  Pt with hx of noncompliance with meds and HD.  Systemic lupus erythematosus (SLE) with pericarditis ([place])- (present on admission) On however several hours admissions, she has been recommended to follow-up with rheumatology as an outpatient.  She used to follow with Dr. Benjamine Mola.  Continue Plaquenil, prednisone        Subjective:  Patient seen and examined at the bedside this morning.  Hemodynamically stable.  Comfortable, lying in bed.  Denies any complaints, on room air.  Very eager to go home.  Ambulating well and going to bathroom by herself.    Physical Exam: Vitals:   01/08/22 0332 01/08/22 0601 01/08/22 0823 01/08/22 1222  BP: (!) 156/101  (!) 175/115 (!) 144/106  Pulse: 75 75    Resp: 20 14    Temp: 98.6 F (37 C)  98.5 F (36.9 C) 98.9 F (37.2 C)  TempSrc: Oral  Oral Oral  SpO2: 99% 100%    Weight:  58.4 kg  Height:        Data Reviewed:    Family Communication:None at bedside  Disposition:Home tomorrow Status is: Inpatient          Planned Discharge Destination: Home       Author: Shelly Coss, MD 01/08/2022 2:18 PM  For on call review [email or web address].

## 2022-01-09 ENCOUNTER — Other Ambulatory Visit (HOSPITAL_COMMUNITY): Payer: Self-pay

## 2022-01-09 ENCOUNTER — Encounter: Payer: Self-pay | Admitting: Hematology and Oncology

## 2022-01-09 DIAGNOSIS — I2699 Other pulmonary embolism without acute cor pulmonale: Secondary | ICD-10-CM | POA: Insufficient documentation

## 2022-01-09 MED ORDER — TORSEMIDE 20 MG PO TABS
20.0000 mg | ORAL_TABLET | Freq: Two times a day (BID) | ORAL | 1 refills | Status: DC
  Filled 2022-01-09: qty 60, 30d supply, fill #0
  Filled 2022-01-27: qty 60, 30d supply, fill #1

## 2022-01-09 MED ORDER — CLONIDINE HCL 0.3 MG PO TABS
0.3000 mg | ORAL_TABLET | Freq: Three times a day (TID) | ORAL | 1 refills | Status: DC
  Filled 2022-01-09 – 2022-01-27 (×2): qty 90, 30d supply, fill #0

## 2022-01-09 MED ORDER — OMRON 3 SERIES BP MONITOR DEVI
1.0000 | Freq: Two times a day (BID) | 0 refills | Status: DC
  Filled 2022-01-09: qty 1, fill #0
  Filled 2022-01-27: qty 1, 1d supply, fill #0

## 2022-01-09 MED ORDER — NICOTINE 21 MG/24HR TD PT24
21.0000 mg | MEDICATED_PATCH | Freq: Every day | TRANSDERMAL | 0 refills | Status: DC
  Filled 2022-01-09: qty 28, 28d supply, fill #0

## 2022-01-09 MED ORDER — APIXABAN (ELIQUIS) VTE STARTER PACK (10MG AND 5MG)
10.0000 mg | ORAL_TABLET | Freq: Two times a day (BID) | ORAL | 0 refills | Status: DC
  Filled 2022-01-09: qty 74, 30d supply, fill #0

## 2022-01-09 MED ORDER — CALCIUM ACETATE (PHOS BINDER) 667 MG PO CAPS
1334.0000 mg | ORAL_CAPSULE | Freq: Three times a day (TID) | ORAL | 0 refills | Status: DC
  Filled 2022-01-09: qty 180, 30d supply, fill #0

## 2022-01-09 MED ORDER — AMLODIPINE BESYLATE 10 MG PO TABS
10.0000 mg | ORAL_TABLET | Freq: Every day | ORAL | 1 refills | Status: DC
  Filled 2022-01-09 – 2022-01-27 (×2): qty 30, 30d supply, fill #0

## 2022-01-09 MED ORDER — HYDRALAZINE HCL 100 MG PO TABS
100.0000 mg | ORAL_TABLET | Freq: Three times a day (TID) | ORAL | 1 refills | Status: DC
  Filled 2022-01-09: qty 90, 30d supply, fill #0
  Filled 2022-01-27: qty 90, 30d supply, fill #1

## 2022-01-09 MED ORDER — APIXABAN 5 MG PO TABS
5.0000 mg | ORAL_TABLET | Freq: Two times a day (BID) | ORAL | 1 refills | Status: DC
  Filled 2022-01-09 – 2022-01-27 (×2): qty 60, 30d supply, fill #0

## 2022-01-09 MED ORDER — CARVEDILOL 12.5 MG PO TABS
12.5000 mg | ORAL_TABLET | Freq: Two times a day (BID) | ORAL | 1 refills | Status: DC
  Filled 2022-01-09 – 2022-01-27 (×2): qty 60, 30d supply, fill #0

## 2022-01-09 MED ORDER — HYDROXYCHLOROQUINE SULFATE 200 MG PO TABS
100.0000 mg | ORAL_TABLET | ORAL | 0 refills | Status: DC
  Filled 2022-01-09: qty 8, 32d supply, fill #0
  Filled 2022-01-27: qty 8, 32d supply, fill #1

## 2022-01-09 NOTE — Progress Notes (Signed)
[facility] Progress Note   Subjective:   Patient seen and examined at bedside.  Feeling better today.  Wants to go home and do dialysis as outpatient.  Renal navigator has arranged for dialysis tomorrow AM.  Denies CP, SOB, abdominal pain and n/v/d.   Objective Vitals:   01/09/22 0430 01/09/22 0500 01/09/22 0734 01/09/22 0829  BP: (!) 168/109  (!) 172/110 (!) 167/119  Pulse: 62  72   Resp: 14  16   Temp: (!) 97.5 F (36.4 C)  98.1 F (36.7 C)   TempSrc: Oral  Oral   SpO2: 100%  100%   Weight:  61.6 kg    Height:       Physical Exam General:chronically ill appearing female in NAD Heart:RRR, no mrg Lungs:CTAB, nml WOB on RA Abdomen:soft, +distended Extremities:no LE edema Dialysis Access: [facility]   Filed Weights   01/07/22 1813 01/08/22 0601 01/09/22 0500  Weight: 58.6 kg 58.4 kg 61.6 kg    Intake/Output Summary (Last 24 hours) at 01/09/2022 1111 Last data filed at 01/09/2022 0700 Gross per 24 hour  Intake 900 ml  Output --  Net 900 ml    Additional Objective Labs: Basic Metabolic Panel: Recent Labs  Lab 01/06/22 1937 01/07/22 0132 01/08/22 0444  NA 135 133* 135  K 5.0 5.1 4.3  CL 97* 96* 97*  CO2 24 27 25   GLUCOSE 106* 88 105*  BUN 48* 54* 35*  CREATININE 5.86* 6.46* 4.54*  CALCIUM 7.8* 7.7* 8.3*  PHOS 2.8 2.8 2.5   Liver Function Tests: Recent Labs  Lab 01/06/22 1937 01/07/22 0132 01/08/22 0444  AST 21  --   --   ALT 21  --   --   ALKPHOS 210*  --   --   BILITOT 0.2*  --   --   PROT 6.1*  --   --   ALBUMIN 2.6* 2.5* 2.4*   Recent Labs  Lab 01/06/22 1937  LIPASE 42   CBC: Recent Labs  Lab 01/05/22 0005 01/05/22 1953 01/05/22 2000 01/07/22 0132 01/08/22 0444  WBC 11.7* 11.6*  --  8.4 9.0  HGB 8.6* 9.1* 9.5* 8.0* 8.2*  HCT 27.7* 29.8* 28.0* 25.2* 26.1*  MCV 88.8 90.0  --  89.4 88.2  PLT 416* 378  --  294 333   Blood Culture    Component Value Date/Time   SDES EXPECTORATED SPUTUM 01/07/2022 1153   SPECREQUEST NONE  01/07/2022 1153   CULT  12/27/2021 0023    NO GROWTH 5 DAYS Performed at [facility], [place] [address] [place], [place] [address]    REPTSTATUS PENDING 01/07/2022 1153   CBG: Recent Labs  Lab 01/05/22 2134 01/06/22 1250 01/07/22 2241 01/08/22 1600  GLUCAP 220* 94 127* 132*   Iron Studies:  Recent Labs    01/07/22 1841  IRON 46  TIBC 277  FERRITIN 987*   Lab Results  Component Value Date   INR 1.2 12/27/2021   INR 1.2 12/13/2021   INR 1.0 06/08/2021   Studies/Results: No results found.  Medications:  sodium chloride     sodium chloride     ferric gluconate (FERRLECIT) IVPB      amLODipine  10 mg Oral Daily   amoxicillin-clavulanate  1 tablet Oral QHS   apixaban  10 mg Oral BID   Followed by   Derrill Memo ON 01/15/2022] apixaban  5 mg Oral BID   aspirin  325 mg Oral Daily   carvedilol  12.5 mg Oral  BID WC   Chlorhexidine Gluconate Cloth  6 each Topical Q0600   cloNIDine  0.3 mg Oral TID   colchicine  0.3 mg Oral Daily   hydrALAZINE  50 mg Oral TID   hydroxychloroquine  100 mg Oral Q48H   nicotine  21 mg Transdermal Daily   predniSONE  40 mg Oral Q breakfast   torsemide  20 mg Oral BID    Dialysis Orders: [facility] 400/800 52kg 2K 2Ca TDC Hectorol 85mcg qHD No Heparin Mircera 277mcg q2wks - last 01/02/22   Calcium acetate 2AC TID   Assessment/Plan: 1. PE (segmental) w/ RLL pulm infarction - noted on CTA in RLL.  Started on Heparin drip and transitioned to eliquis. 2. Pericarditis/pericardial effusion - chronic issue. Per CTA  on 2/6 moderate to large volume. Per PMD 3. Hyperkalemia - resolved with HD. Last K 4.3 4. Malignant hypertension - BP variable, continue home meds. Per PMD 5.  ?UTI - symptoms resolved. 6. Recent PNA- Augmentin started - 500mg  qd.   7. ESRD - on HD TTS.  HD off schedule tomorrow, then resume regular schedule on Friday. 8. Anemia of CKD- Hgb 8.2. on max ESA, recently dosed.  tsat 17%, start iron load. 9. Secondary  hyperparathyroidism - Calcium at goal.  Continue VDRA. Repeat phos in goal.  Continue to hold binders.  10. Volume - Does not appear volume overloaded. Small pleural effusions noted on CXR.  Under dry weight if weights correct.  UF as tolerated. 11. Nutrition - Renal diet with fluid restrictions.  12. Lupus - on plaquenil and prednisone.  Jen Mow, PA-C [facility] 01/09/2022,11:11 AM  LOS: 3 days

## 2022-01-09 NOTE — Discharge Summary (Signed)
Physician Discharge Summary  Cathy [name] QPY:[id number] DOB: [birth year] DOA: 01/05/2022  PCP: Pcp, No  Admit date: 01/05/2022 Discharge date: 01/09/2022  Admitted From: Home Disposition:  Home  Discharge Condition:Stable CODE STATUS:FULL Diet recommendation: Renal Diet  Brief/Interim Summary:  41 year old female, recently admitted to the hospital with right middle lobe pneumonia, pericarditis and pericardial effusion.  She has a history of lupus, end-stage renal disease on hemodialysis, combined CHF with ejection fraction of 25%, presented to the emergency room with chest pain, shortness of breath.  She had missed her last dialysis session.  She was markedly hypertensive.  She was also hyperkalemic and noted to have peaked T waves.  She was started on nitroglycerin infusion and underwent urgent dialysis.  Continued to be hypertensive and was restarted on home medications.  Eventually, nitroglycerin was weaned off.  CT scan of the chest ruled out aortic dissection, but did comment on acute pulmonary embolus.  She was started on  IV heparin.  Now transition to Eliquis.  Currently she is hemodynamically stable for discharge.  Following problems were addressed during her hospitalization:   Malignant hypertension- (present on admission) Initially started on NTG gtts. Drip has been stopped now.  She was on Coreg, clonidine, hydralazine, amlodipine at home.  Restarted these medications with improvement in the blood pressure.  She has been counseled to be compliant on her medications and monitor blood pressure at home.   Acute pulmonary embolus ([place])- (present on admission) CT angiogram showed acute PE.  Described as segmental sized pulmonary embolism in the right lower lobe with area of alveolar hemorrhage in the anterolateral aspect of the right lower lobe.  Started on heparin drip, transitioned to Eliquis  Chronic combined systolic and diastolic congestive heart failure ([place])- (present on  admission) Appears euvolemic.  Volume management as per dialysis. Echo has shown EF of 20 to 25%, global hypokinesis, grade 3 diastolic dysfunction.  On Coreg, torsemide, hydralazine.  She needs to follow-up with cardiology as an outpatient.   PNA (pneumonia) Patient was recently discharged on Augmentin, she is due to complete her course prior to admission.  She had some cough.  Augmentin has was resumed and she completed the course.  Respiratory status is stable.   ESRD on hemodialysis [facility]) Received emergent HD tonight due to hyperkalemia. Nephrology following and she underwent dialysis.  Hyperkalemia has resolved.   Pericardial effusion- (present on admission) Moderate to large pleural effusion is seen on the CT imaging.  This is a chronic finding.  Likely from uncontrolled lupus.  Echo was repeated here and did not show significant change from imaging no evidence of tamponade.  Plan is to continue monitoring.  Follow-up with cardiology recommended as an outpatient   Non compliance w medication regimen  Pt with hx of noncompliance with meds and HD.  Counseled to be compliant   Systemic lupus erythematosus (SLE) with pericarditis ([place])- (present on admission) On however several hours admissions, she has been recommended to follow-up with rheumatology as an outpatient.  She used to follow with Dr. Benjamine Mola.  Continue Plaquenil, prednisone.  She needs to follow-up with Dr. Benjamine Mola as an outpatient     Discharge Diagnoses:  Principal Problem:   Malignant hypertension Active Problems:   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)   Non compliance w medication regimen   Renal hypertension   Pericardial effusion   Hyperkalemia   ESRD on hemodialysis (HCC)   PNA (pneumonia)   Chronic combined systolic and diastolic congestive heart failure ([place])  Hypertensive urgency   Acute pulmonary embolus [facility])    Discharge Instructions  Discharge Instructions     Diet - low sodium  heart healthy   Complete by: As directed    Discharge instructions   Complete by: As directed    1)Please take prescribed medications as instructed 2)Follow up with your PCP in a week 3)Monitor your blood pressure at home   Increase activity slowly   Complete by: As directed    No wound care   Complete by: As directed       Allergies as of 01/09/2022   No Active Allergies      Medication List     STOP taking these medications    amoxicillin-clavulanate 500-125 MG tablet Commonly known as: AUGMENTIN       TAKE these medications    (feeding supplement) PROSource Plus liquid Take 30 mLs by mouth 3 (three) times daily between meals.   acetaminophen 500 MG tablet Commonly known as: TYLENOL Take 1,000 mg by mouth every 6 (six) hours as needed for moderate pain.   albuterol 108 (90 Base) MCG/ACT inhaler Commonly known as: VENTOLIN HFA Inhale 2 puffs into the lungs every 6 (six) hours as needed for wheezing or shortness of breath.   amLODipine 10 MG tablet Commonly known as: NORVASC Take 1 tablet (10 mg total) by mouth daily.   apixaban 5 MG Tabs tablet Commonly known as: ELIQUIS Take 2 tablets (10 mg total) by mouth 2 (two) times daily for 6 days.   apixaban 5 MG Tabs tablet Commonly known as: ELIQUIS Take 1 tablet (5 mg total) by mouth 2 (two) times daily. Start taking on: January 15, 2022   Blood Pressure Kit 1 each by Does not apply route 2 (two) times daily.   calcium acetate (Phos Binder) 667 MG/5ML Soln Commonly known as: PHOSLYRA Take 10 mLs (1,334 mg total) by mouth 3 (three) times daily with meals.   carvedilol 12.5 MG tablet Commonly known as: COREG Take 1 tablet (12.5 mg total) by mouth 2 (two) times daily with a meal.   cloNIDine 0.3 MG tablet Commonly known as: CATAPRES Take 1 tablet (0.3 mg total) by mouth 3 (three) times daily.   colchicine 0.6 MG tablet Take 0.5 tablets (0.3 mg total) by mouth daily.   guaiFENesin 600 MG 12 hr  tablet Commonly known as: MUCINEX Take 600 mg by mouth 2 (two) times daily as needed for cough.   hydrALAZINE 100 MG tablet Commonly known as: APRESOLINE Take 1 tablet (100 mg total) by mouth 3 (three) times daily. What changed:  medication strength how much to take   hydroxychloroquine 200 MG tablet Commonly known as: PLAQUENIL Take 0.5 tablets (100 mg total) by mouth every other day. Take one table after hemodialysis   hydrOXYzine 25 MG tablet Commonly known as: ATARAX Take 1 tablet (25 mg total) by mouth 3 (three) times daily as needed for itching or anxiety.   Icy Hot 10-30 % Stck Apply 1 application topically as needed (pain).   ipratropium 17 MCG/ACT inhaler Commonly known as: ATROVENT HFA Inhale 2 puffs into the lungs every 6 (six) hours as needed for wheezing.   methocarbamol 500 MG tablet Commonly known as: ROBAXIN Take 1 tablet (500 mg total) by mouth every 6 (six) hours as needed for muscle spasms.   nicotine 21 mg/24hr patch Commonly known as: NICODERM CQ - dosed in mg/24 hours Place 1 patch (21 mg total) onto the skin daily. Start taking on: January 10, 2022   oxyCODONE 5 MG immediate release tablet Commonly known as: Oxy IR/ROXICODONE Take 1 tablet (5 mg total) by mouth every 6 (six) hours as needed for severe pain.   predniSONE 20 MG tablet Commonly known as: DELTASONE Take 2 tablets (40 mg total) by mouth daily with breakfast.   torsemide 20 MG tablet Commonly known as: DEMADEX Take 1 tablet (20 mg total) by mouth 2 (two) times daily.        Follow-up Information     Donald Prose, MD Follow up.   Specialty: Family Medicine Why: Primary Care MD. Please go to scheduled appointment Contact information: [place] [address] [phone]         Gildardo Pounds, NP Follow up.   Specialty: Nurse Practitioner Why: TIME: 2:30 PM DATE: MARCH 15,2023 Geryl Rankins , NP NEW LOCATION : [place].  [phone] Contact information: [place] [address] [phone]         [facility] Go on 01/10/2022.   Why: Patient has HD appointment on 01/10/22. Patient needs to arrive at 5:40 for 6:00 chair time. Medicaid transportation arranged for appointment. Contact information: [address] [place] [address] [phone]                No Active Allergies  Consultations: Nephrology   Procedures/Studies: DG Chest 2 View  Result Date: 12/26/2021 CLINICAL DATA:  Chest pain. EXAM: CHEST - 2 VIEW COMPARISON:  Chest radiograph dated 12/17/2021. FINDINGS: Right-sided dialysis catheter in similar position. There is cardiomegaly. Small bilateral pleural effusions, left greater right, with bibasilar atelectasis or infiltrate. No pneumothorax. No acute osseous pathology. IMPRESSION: 1. Small bilateral pleural effusions with bibasilar atelectasis or infiltrate. Overall similar to prior radiograph. 2. Cardiomegaly. Electronically Signed   By: Anner Crete M.D.   On: 12/26/2021 21:32   CT Angio Chest PE W and/or Wo Contrast  Result Date: 12/13/2021 CLINICAL DATA:  Shortness of breath, chest pain, epigastric pain EXAM: CT ANGIOGRAPHY CHEST WITH CONTRAST TECHNIQUE: Multidetector CT imaging of the chest was performed using the standard protocol during bolus administration of intravenous contrast. Multiplanar CT image reconstructions and MIPs were obtained to evaluate the vascular anatomy. RADIATION DOSE REDUCTION: This exam was performed according to the departmental dose-optimization program which includes automated exposure control, adjustment of the mA and/or kV according to patient size and/or use of iterative reconstruction technique. CONTRAST:  122m OMNIPAQUE IOHEXOL 350 MG/ML SOLN COMPARISON:  02/01/2019 FINDINGS: Cardiovascular: Heart is enlarged in size. There is moderate to large pericardial effusion. There is no contrast enhancement in  thoracic aorta limiting evaluation of the lumen of thoracic aorta. There are no intraluminal filling defects in central pulmonary artery branches. Evaluation of small subsegmental peripheral branches in the lower lung fields is limited by infiltrates. There is ectasia of main pulmonary artery measuring 4 cm. There is no evidence of right ventricular strain. Mediastinum/Nodes: No significant lymphadenopathy seen. Lungs/Pleura: There is moderate right pleural effusion. There is small left pleural effusion. Large patchy alveolar infiltrate is seen in right lower lobe. There are smaller patchy infiltrates in right middle lobe and left lower lobe. Upper Abdomen: Unremarkable. Musculoskeletal: Unremarkable. Review of the MIP images confirms the above findings. IMPRESSION: There is no evidence of central pulmonary artery embolism. Evaluation of small peripheral subsegmental branches in the lower lobes is limited by infiltrates. There is ectasia of main pulmonary artery suggesting pulmonary arterial hypertension. There is large patchy  alveolar infiltrate in the right lower lobe. Small patchy alveolar infiltrates are seen in the right middle lobe and left lower lobe. Findings suggest possible multifocal pneumonia. Moderate right pleural effusion. Small left pleural effusion. Moderate to large pericardial effusion. Electronically Signed   By: Elmer Picker M.D.   On: 12/13/2021 13:44   CT ABDOMEN PELVIS W CONTRAST  Result Date: 12/13/2021 CLINICAL DATA:  Abdominal pain.  Patient unable to lie flat EXAM: CT ABDOMEN AND PELVIS WITH CONTRAST TECHNIQUE: Multidetector CT imaging of the abdomen and pelvis was performed using the standard protocol following bolus administration of intravenous contrast. RADIATION DOSE REDUCTION: This exam was performed according to the departmental dose-optimization program which includes automated exposure control, adjustment of the mA and/or kV according to patient size and/or use of  iterative reconstruction technique. CONTRAST:  127m OMNIPAQUE IOHEXOL 350 MG/ML SOLN COMPARISON:  Chest CT same day FINDINGS: Lower chest: RIGHT lobe pneumonia. Large RIGHT effusion with passive atelectasis in the RIGHT lower lobe. Cardiomegaly Hepatobiliary: No enhancing hepatic lesion. Portal veins are patent. There is periportal edema. Small fluid around the liver. Gallbladder normal. Pancreas: small cyst in the pancreas measures 6 mm (image 47/3). No worrisome characteristics. No communication with the pancreatic duct. No pancreatic duct dilatation. Spleen: Normal spleen Adrenals/urinary tract: Adrenal glands normal. Four enhancement of the renal parenchyma. No hydronephrosis. Ureters and bladder grossly normal. Bladder collapsed Stomach/Bowel: Stomach, small bowel normal. Colon rectosigmoid colon normal. Vascular/Lymphatic: Abdominal aorta is normal caliber. There is no retroperitoneal or periportal lymphadenopathy. No pelvic lymphadenopathy. Reproductive: Uterus and adnexa unremarkable. Other: Normal moderate volume of intraperitoneal free fluid there is prominent pelvis. Mild anasarca soft tissues Musculoskeletal: No aggressive osseous lesion. IMPRESSION: 1. RIGHT lower lobe pneumonia. 2. Moderate volume intraperitoneal free fluid, anasarca and pleural effusions suggest volume overload/congestive heart failure. 3. Poor renal enhancement consistent with renal insufficiency. 4. Small benign appearing cyst in the pancreas. Per consensus recommendation, consider follow-up MRI in 1 year. This recommendation follows ACR consensus guidelines: Management of Incidental Pancreatic Cysts: A White Paper of the ACR Incidental Findings Committee. [id number];18:[id number] Electronically Signed   By: SSuzy BouchardM.D.   On: 12/13/2021 13:51   DG Chest Port 1 View  Result Date: 01/05/2022 CLINICAL DATA:  Dyspnea, missed dialysis EXAM: PORTABLE CHEST 1 VIEW COMPARISON:  12/26/2021 FINDINGS: Moderate to severe  cardiomegaly is stable. Pulmonary vascularity is normal. Small bilateral pleural effusions are present, left greater than right. No pneumothorax. No confluent pulmonary infiltrate. Right internal jugular hemodialysis catheter tip noted within the right atrium. No acute bone abnormality. IMPRESSION: Stable cardiomegaly. Small bilateral pleural effusions, left greater than right. Electronically Signed   By: AFidela SalisburyM.D.   On: 01/05/2022 19:52   DG Chest Port 1 View  Result Date: 12/17/2021 CLINICAL DATA:  Acute respiratory failure. EXAM: PORTABLE CHEST 1 VIEW COMPARISON:  12/16/2021 FINDINGS: There is a right-sided dialysis catheter with tip in the cavoatrial junction. The right-sided chest tube has been removed. No pneumothorax visualized. Unchanged enlargement of the cardiac silhouette reflecting underlying pericardial effusion. Bilateral pleural effusions are again noted and appear unchanged. Right lower lobe airspace disease is unchanged. Atelectasis within the left base is also stable. IMPRESSION: 1. No change in bilateral pleural effusions with lower lobe airspace consolidation and atelectasis. 2. Stable enlargement of the cardiac silhouette reflecting underlying pericardial effusion. 3. Interval removal of right chest tube without visible pneumothorax. Electronically Signed   By: TKerby MoorsM.D.   On: 12/17/2021 05:58  DG CHEST PORT 1 VIEW  Result Date: 12/16/2021 CLINICAL DATA:  Chest tube, pleural effusion EXAM: PORTABLE CHEST 1 VIEW COMPARISON:  12/15/2021 FINDINGS: No significant change in AP portable chest radiograph. Pigtail chest tube remains about the right lung base with associated small effusion and atelectasis or consolidation. No significant pneumothorax. Probable small left pleural effusion and or retrocardiac atelectasis. Cardiomegaly. Large-bore right neck multi lumen vascular catheter. IMPRESSION: 1. No significant change in AP portable chest radiograph. Pigtail chest tube  remains about the right lung base with associated small effusion and atelectasis or consolidation. No significant pneumothorax. 2. Probable small left pleural effusion and or retrocardiac atelectasis. 3.  Cardiomegaly. Electronically Signed   By: Delanna Ahmadi M.D.   On: 12/16/2021 09:07   DG CHEST PORT 1 VIEW  Result Date: 12/15/2021 CLINICAL DATA:  Pleural effusion, difficulty breathing EXAM: PORTABLE CHEST 1 VIEW COMPARISON:  Previous studies including the examination of 12/14/2021 FINDINGS: Transverse diameter of heart is increased. There are no signs of pulmonary edema. Infiltrates are seen in both lower lung fields with interval partial clearing. Right chest tube is noted with its tip in the medial right lower lung fields. Tip of dialysis catheter is seen in the region right atrium. There is no pneumothorax. IMPRESSION: Cardiomegaly. There are no signs of pulmonary edema. There is interval improvement in aeration of both lower lung fields suggesting partial clearing of atelectasis/pneumonia and possibly decrease in pleural effusions. There is no pneumothorax. Electronically Signed   By: Elmer Picker M.D.   On: 12/15/2021 08:38   DG Chest Port 1 View  Result Date: 12/14/2021 CLINICAL DATA:  Chest tube placement, recheck right pneumothorax. EXAM: PORTABLE CHEST 1 VIEW COMPARISON:  Chest CT with contrast yesterday at 1:18 p.m., portable chest yesterday at 5:01 p.m. FINDINGS: Pigtail right pleural drain again has its tip in the medial base of the right chest. Minimal right apical pneumothorax estimated 3% or less continues to be noted and is unchanged. Right basilar airspace disease and at least a small pleural effusion continue to be noted with dense left lower lobe consolidation still present and a small left effusion. The more cephalad lung fields are clear. There is a right IJ double-lumen catheter with the tip in the right atrium. There is cardiomegaly with normal caliber central vessels and  stable mediastinum. IMPRESSION: There are no changes in the overall aeration from yesterday's most recent film. Minimal 3% or less right apical pneumothorax persists as well as right basilar airspace disease and small right effusion. Aeration on left is likewise unchanged with denser lower lobe consolidation and small effusion on this side as well. Chest tube positioning is unaltered. Electronically Signed   By: Telford Nab M.D.   On: 12/14/2021 03:54   DG Chest Port 1 View  Result Date: 12/13/2021 CLINICAL DATA:  Right pleural effusion.  Chest tube placement. EXAM: PORTABLE CHEST 1 VIEW COMPARISON:  12/13/2021 FINDINGS: A new right pleural pigtail catheter is seen in the lower right hemithorax, with decreased size of small right pleural effusion since prior study. A tiny less than 5% right apical pneumothorax is now seen. Airspace disease in the right lower lung shows no significant change. Small left pleural effusion and left retrocardiac atelectasis or infiltrate, also unchanged. Cardiomegaly stable. A right jugular dual-lumen central venous catheter is seen with tip overlying the mid right atrium. IMPRESSION: Decreased right pleural effusion following pleural drainage catheter placement. Tiny less than 5% right apical pneumothorax. Stable right lower lung airspace disease. Stable  small left pleural effusion, and left basilar atelectasis versus infiltrate. Stable cardiomegaly. Electronically Signed   By: Marlaine Hind M.D.   On: 12/13/2021 17:29   DG Chest Port 1 View  Result Date: 12/13/2021 CLINICAL DATA:  Questionable sepsis.  Evaluate for abnormality. EXAM: PORTABLE CHEST 1 VIEW COMPARISON:  06/15/2021 FINDINGS: There is a right jugular dialysis catheter with the tip extending into the right atrium. Bibasilar chest densities are compatible with pleural effusions and basilar consolidation or airspace disease. Pleural effusions are likely moderate in size particularly on the right side. Negative for  pneumothorax. Cardiac silhouette appears to be enlarged and stable from the previous examination. IMPRESSION: New or enlarged bibasilar chest densities. Findings are suggestive for combination of pleural effusions with consolidation/airspace disease. Underlying pneumonia cannot be excluded. Dialysis catheter as described. Electronically Signed   By: Markus Daft M.D.   On: 12/13/2021 11:37   ECHOCARDIOGRAM COMPLETE  Result Date: 01/06/2022    ECHOCARDIOGRAM REPORT   Patient Name:   Cathy [name] Date of Exam: 01/06/2022 Medical Rec #:  [id number]        Height:       67.0 in Accession #:    [id number]       Weight:       114.0 lb Date of Birth:  Jan 24, 1981       BSA:          1.593 m Patient Age:    89 years         BP:           177/125 mmHg Patient Gender: F                HR:           86 bpm. Exam Location:  Inpatient Procedure: 2D Echo, Cardiac Doppler and Color Doppler Indications:    pericardial effusion  History:        Patient has prior history of Echocardiogram examinations, most                 recent 12/27/2021. Risk Factors:Hypertension. ESRD.  Sonographer:    Melissa Morford RDCS (AE, PE) Referring Phys: [place]  1. Left ventricular ejection fraction, by estimation, is 20 to 25%. The left ventricle has severely decreased function. The left ventricle demonstrates global hypokinesis. Left ventricular diastolic parameters are consistent with Grade III diastolic dysfunction (restrictive).  2. Right ventricular systolic function is normal. The right ventricular size is normal.  3. Left atrial size was mildly dilated.  4. Right atrial size was mildly dilated.  5. Moderate pericardial effusion. The pericardial effusion is circumferential. There is no evidence of cardiac tamponade.  6. The mitral valve is normal in structure. Mild mitral valve regurgitation. No evidence of mitral stenosis.  7. Tricuspid valve regurgitation is moderate.  8. The aortic valve is normal in structure. Aortic  valve regurgitation is mild. No aortic stenosis is present.  9. Pulmonic valve regurgitation is moderate. 10. The inferior vena cava is normal in size with greater than 50% respiratory variability, suggesting right atrial pressure of 3 mmHg. Comparison(s): No significant change from prior study. Prior images reviewed side by side. FINDINGS  Left Ventricle: Left ventricular ejection fraction, by estimation, is 20 to 25%. The left ventricle has severely decreased function. The left ventricle demonstrates global hypokinesis. The left ventricular internal cavity size was normal in size. There is no left ventricular hypertrophy. Left ventricular diastolic parameters are consistent with Grade III diastolic dysfunction (  restrictive). Right Ventricle: The right ventricular size is normal. No increase in right ventricular wall thickness. Right ventricular systolic function is normal. Left Atrium: Left atrial size was mildly dilated. Right Atrium: Right atrial size was mildly dilated. Pericardium: A moderately sized pericardial effusion is present. The pericardial effusion is circumferential. There is no evidence of cardiac tamponade. Mitral Valve: The mitral valve is normal in structure. Mild mitral valve regurgitation. No evidence of mitral valve stenosis. Tricuspid Valve: The tricuspid valve is normal in structure. Tricuspid valve regurgitation is moderate . No evidence of tricuspid stenosis. Aortic Valve: The aortic valve is normal in structure. Aortic valve regurgitation is mild. No aortic stenosis is present. Pulmonic Valve: The pulmonic valve was normal in structure. Pulmonic valve regurgitation is moderate. No evidence of pulmonic stenosis. Aorta: The aortic root is normal in size and structure. Venous: The inferior vena cava is normal in size with greater than 50% respiratory variability, suggesting right atrial pressure of 3 mmHg. IAS/Shunts: No atrial level shunt detected by color flow Doppler.  LEFT VENTRICLE PLAX  2D LVIDd:         5.17 cm      Diastology LVIDs:         4.62 cm      LV e' medial:    5.77 cm/s LV PW:         1.11 cm      LV E/e' medial:  21.8 LV IVS:        1.16 cm      LV e' lateral:   5.93 cm/s LVOT diam:     2.00 cm      LV E/e' lateral: 21.2 LV SV:         57 LV SV Index:   36 LVOT Area:     3.14 cm  LV Volumes (MOD) LV vol d, MOD A2C: 133.0 ml LV vol d, MOD A4C: 104.5 ml LV vol s, MOD A2C: 110.0 ml LV vol s, MOD A4C: 96.6 ml LV SV MOD A2C:     23.0 ml LV SV MOD A4C:     104.5 ml LV SV MOD BP:      15.0 ml RIGHT VENTRICLE TAPSE (M-mode): 1.6 cm LEFT ATRIUM             Index        RIGHT ATRIUM           Index LA diam:        4.30 cm 2.70 cm/m   RA Area:     19.50 cm LA Vol (A2C):   81.1 ml 50.93 ml/m  RA Volume:   53.30 ml  33.47 ml/m LA Vol (A4C):   69.5 ml 43.64 ml/m LA Biplane Vol: 79.8 ml 50.11 ml/m  AORTIC VALVE LVOT Vmax:   109.00 cm/s LVOT Vmean:  69.500 cm/s LVOT VTI:    0.183 m  AORTA Ao Root diam: 3.10 cm Ao Asc diam:  3.50 cm MITRAL VALVE                TRICUSPID VALVE MV Area (PHT): 6.32 cm     TR Peak grad:   19.7 mmHg MV Decel Time: 120 msec     TR Vmax:        222.00 cm/s MV E velocity: 126.00 cm/s MV A velocity: 68.30 cm/s   SHUNTS MV E/A ratio:  1.84         Systemic VTI:  0.18 m  Systemic Diam: 2.00 cm Candee Furbish MD Electronically signed by Candee Furbish MD Signature Date/Time: 01/06/2022/1:57:47 PM    Final    ECHOCARDIOGRAM COMPLETE  Result Date: 12/13/2021    ECHOCARDIOGRAM REPORT   Patient Name:   Cathy [name] Date of Exam: 12/13/2021 Medical Rec #:  [id number]        Height:       67.0 in Accession #:    [id number]       Weight:       146.2 lb Date of Birth:  25-Dec-1980       BSA:          1.770 m Patient Age:    49 years         BP:           178/136 mmHg Patient Gender: F                HR:           93 bpm. Exam Location:  Inpatient Procedure: 2D Echo, Cardiac Doppler and Color Doppler Indications:    plureral effusion  History:         Patient has no prior history of Echocardiogram examinations.                 Signs/Symptoms:Dyspnea. GERD / PLEURAL EFFUSION.  Sonographer:    Beryle Beams Referring Phys: [id number] [place]  1. Left ventricular ejection fraction, by estimation, is 25 to 30%. The left ventricle has severely decreased function. The left ventricle demonstrates global hypokinesis. The left ventricular internal cavity size was mildly dilated. There is mild concentric left ventricular hypertrophy. Left ventricular diastolic parameters are consistent with Grade II diastolic dysfunction (pseudonormalization).  2. Right ventricular systolic function mildly-to-moderately reduced. The right ventricular size is mildly-to-moderately enlarged. There is mildly elevated pulmonary artery systolic pressure. The estimated right ventricular systolic pressure is 89.1 mmHg.  3. Left atrial size was severely dilated.  4. Right atrial size was moderately dilated.  5. Moderate pericardial effusion. The pericardial effusion is circumferential. There is no evidence of cardiac tamponade.  6. The mitral valve is abnormal. There is mitral annular dilation with tethering of the posterior leaflet and mild mitral regurgitation.  7. Tricuspid valve regurgitation is moderate.  8. The aortic valve is tricuspid. There is mild thickening of the aortic valve. Aortic valve regurgitation is mild. No aortic stenosis is present.  9. The inferior vena cava is dilated in size with <50% respiratory variability, suggesting right atrial pressure of 15 mmHg. Comparison(s): Compared to prior TTE in 05/2021, there is no significant change. There continues to be biventricular dysfunction and a moderate pericardial effusion present without tamponade. FINDINGS  Left Ventricle: Left ventricular ejection fraction, by estimation, is 25 to 30%. The left ventricle has severely decreased function. The left ventricle demonstrates global hypokinesis. The left ventricular  internal cavity size was mildly dilated. There is mild concentric left ventricular hypertrophy. Left ventricular diastolic parameters are consistent with Grade II diastolic dysfunction (pseudonormalization). Right Ventricle: The right ventricular size is mildly-to-moderately enlarged. No increase in right ventricular wall thickness. Right ventricular systolic function mildly-to-moderately reduced. There is mildly elevated pulmonary artery systolic pressure. The tricuspid regurgitant velocity is 2.62 m/s, and with an assumed right atrial pressure of 10 mmHg, the estimated right ventricular systolic pressure is 69.4 mmHg. Left Atrium: Left atrial size was severely dilated. Right Atrium: Right atrial size was moderately dilated. Pericardium: Measuring up to 1.5cm at end-diastole. A moderately sized pericardial  effusion is present. The pericardial effusion is circumferential. There is no evidence of cardiac tamponade. Mitral Valve: The mitral valve is abnormal. There is mild thickening of the mitral valve leaflet(s). There is mild calcification of the mitral valve leaflet(s). There is mild mitral regurgitation. Tricuspid Valve: The tricuspid valve is normal in structure. Tricuspid valve regurgitation is moderate. Aortic Valve: The aortic valve is tricuspid. There is mild thickening of the aortic valve. Aortic valve regurgitation is mild. Aortic regurgitation PHT measures 462 msec. No aortic stenosis is present. Aortic valve mean gradient measures 4.0 mmHg. Aortic  valve peak gradient measures 6.1 mmHg. Aortic valve area, by VTI measures 1.18 cm. Pulmonic Valve: The pulmonic valve was normal in structure. Pulmonic valve regurgitation is mild. Aorta: The aortic root and ascending aorta are structurally normal, with no evidence of dilitation. Venous: The inferior vena cava is dilated in size with less than 50% respiratory variability, suggesting right atrial pressure of 15 mmHg. IAS/Shunts: The atrial septum is grossly  normal.  LEFT VENTRICLE PLAX 2D LVIDd:         2.32 cm      Diastology LVIDs:         3.90 cm      LV e' medial:    6.20 cm/s LV PW:         1.50 cm      LV E/e' medial:  16.6 LV IVS:        3.36 cm      LV e' lateral:   8.81 cm/s LVOT diam:     1.70 cm      LV E/e' lateral: 11.7 LV SV:         20 LV SV Index:   11 LVOT Area:     2.27 cm  LV Volumes (MOD) LV vol d, MOD A2C: 132.0 ml LV vol d, MOD A4C: 117.0 ml LV vol s, MOD A2C: 98.8 ml LV vol s, MOD A4C: 107.0 ml LV SV MOD A2C:     33.2 ml LV SV MOD A4C:     117.0 ml LV SV MOD BP:      30.0 ml RIGHT VENTRICLE             IVC RV S prime:     11.30 cm/s  IVC diam: 2.20 cm TAPSE (M-mode): 2.0 cm LEFT ATRIUM              Index        RIGHT ATRIUM           Index LA diam:        4.30 cm  2.43 cm/m   RA Area:     14.10 cm LA Vol (A2C):   106.0 ml 59.89 ml/m  RA Volume:   33.40 ml  18.87 ml/m LA Vol (A4C):   73.0 ml  41.24 ml/m LA Biplane Vol: 89.6 ml  50.62 ml/m  AORTIC VALVE                    PULMONIC VALVE AV Area (Vmax):    1.40 cm     PV Vmax:       0.69 m/s AV Area (Vmean):   1.19 cm     PV Vmean:      37.100 cm/s AV Area (VTI):     1.18 cm     PV VTI:        0.066 m AV Vmax:  123.00 cm/s  PV Peak grad:  1.9 mmHg AV Vmean:          91.700 cm/s  PV Mean grad:  1.0 mmHg AV VTI:            0.171 m AV Peak Grad:      6.1 mmHg AV Mean Grad:      4.0 mmHg LVOT Vmax:         75.80 cm/s LVOT Vmean:        48.200 cm/s LVOT VTI:          0.089 m LVOT/AV VTI ratio: 0.52 AI PHT:            462 msec  AORTA Ao Root diam: 2.70 cm Ao Asc diam:  2.30 cm MITRAL VALVE                TRICUSPID VALVE MV Area (PHT): 5.34 cm     TV Peak grad:   68.6 mmHg MV Decel Time: 142 msec     TV Mean grad:   54.0 mmHg MV E velocity: 103.00 cm/s  TV Vmax:        4.14 m/s MV A velocity: 60.70 cm/s   TV Vmean:       355.0 cm/s MV E/A ratio:  1.70         TV VTI:         1.26 msec                             TR Peak grad:   27.5 mmHg                             TR Vmax:         262.00 cm/s                              SHUNTS                             Systemic VTI:  0.09 m                             Systemic Diam: 1.70 cm Gwyndolyn Kaufman MD Electronically signed by Gwyndolyn Kaufman MD Signature Date/Time: 12/13/2021/6:58:25 PM    Final    CT Angio Chest/Abd/Pel for Dissection W and/or W/WO  Result Date: 01/07/2022 CLINICAL DATA:  41 year old female with history of chest or back pain. Suspected aortic dissection. EXAM: CT ANGIOGRAPHY CHEST, ABDOMEN AND PELVIS TECHNIQUE: Non-contrast CT of the chest was initially obtained. Multidetector CT imaging through the chest, abdomen and pelvis was performed using the standard protocol during bolus administration of intravenous contrast. Multiplanar reconstructed images and MIPs were obtained and reviewed to evaluate the vascular anatomy. RADIATION DOSE REDUCTION: This exam was performed according to the departmental dose-optimization program which includes automated exposure control, adjustment of the mA and/or kV according to patient size and/or use of iterative reconstruction technique. CONTRAST:  65m OMNIPAQUE IOHEXOL 350 MG/ML SOLN COMPARISON:  CT the chest, abdomen and pelvis 12/27/2021. FINDINGS: CTA CHEST FINDINGS Cardiovascular: There is an occlusive segmental sized filling defect in a right lower lobe pulmonary artery branch best appreciated on axial image 92 of series 8. Heart size is mildly enlarged. Moderate to large volume  of pericardial fluid. No pericardial calcification. Precontrast images demonstrate no crescentic high attenuation associated with the wall of the thoracic aorta to suggest acute intramural hemorrhage. No evidence of thoracic aortic aneurysm or dissection. No atherosclerotic calcifications are noted in the thoracic aorta or the coronary arteries. Right internal jugular PermCath with tip terminating in the right atrium. Mediastinum/Nodes: No pathologically enlarged mediastinal or hilar lymph nodes. Esophagus is  unremarkable in appearance. No axillary lymphadenopathy. 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 Extensive passive atelectasis in the left lower lobe and to a lesser extent in the inferior segment of the lingula. Trace right and small left pleural effusions lying dependently. Musculoskeletal: There are no aggressive appearing lytic or blastic lesions noted in the visualized portions of the skeleton. Review of the MIP images confirms the above findings. CTA ABDOMEN AND PELVIS FINDINGS VASCULAR Aorta: Normal caliber aorta without aneurysm, dissection, vasculitis or significant stenosis. Celiac: Patent without evidence of aneurysm, dissection, vasculitis or significant stenosis. SMA: Patent without evidence of aneurysm, dissection, vasculitis or significant stenosis. Renals: Both renal arteries are patent without evidence of aneurysm, dissection, vasculitis, fibromuscular dysplasia or significant stenosis. IMA: Patent without evidence of aneurysm, dissection, vasculitis or significant stenosis. Inflow: Patent without evidence of aneurysm, dissection, vasculitis or significant stenosis. Veins: No obvious venous abnormality within the limitations of this arterial phase study. Review of the MIP images confirms the above findings. NON-VASCULAR Hepatobiliary: No definite suspicious cystic or solid hepatic lesions are confidently identified on today's arterial phase examination. No intra or extrahepatic biliary ductal dilatation. Gallbladder is unremarkable in appearance. Pancreas: No definite pancreatic mass or peripancreatic fluid collections or inflammatory changes are noted on today's arterial phase examination. No pancreatic ductal dilatation. Spleen: Unremarkable. Adrenals/Urinary Tract: Bilateral kidneys and adrenal glands are normal in appearance. No  hydroureteronephrosis. Urinary bladder is normal in appearance. Stomach/Bowel: The appearance of the stomach is normal. No pathologic dilatation of small bowel or colon. Lymphatic: Enlarged left para-aortic lymph node measuring up to 1.5 cm in short axis (axial image 167 of series 8). No other definite lymphadenopathy noted elsewhere in the abdomen or pelvis. Reproductive: Uterus and ovaries are unremarkable in appearance. Bilateral tubal ligation clips are noted. Other: Small to moderate volume of ascites.  No pneumoperitoneum. Musculoskeletal: There are no aggressive appearing lytic or blastic lesions noted in the visualized portions of the skeleton. Review of the MIP images confirms the above findings. IMPRESSION: 1. Study is positive for segmental sized pulmonary embolism in the right lower lobe with area of alveolar hemorrhage in the anterolateral aspect of the right lower lobe. 2. Small left and trace right pleural effusions with some passive atelectasis in the left lung base. 3. Moderate to large pericardial effusion. 4. Small volume of ascites. 5. Enlarged left para-aortic lymph node measuring 1.5 cm in short axis. This is nonspecific, but attention on follow-up studies is recommended. 6. Additional incidental findings, as above. These results will be called to the ordering clinician or representative by the Radiologist Assistant, and communication documented in the PACS or [employer]. Electronically Signed   By: Vinnie Langton M.D.   On: 01/07/2022 05:58   ECHOCARDIOGRAM LIMITED  Result Date: 12/27/2021    ECHOCARDIOGRAM LIMITED REPORT   Patient Name:   Cathy [name] Date of Exam: 12/27/2021 Medical Rec #:  [id number]        Height:  67.0 in Accession #:    [id number]       Weight:       102.3 lb Date of Birth:  [birth year]       BSA:          1.521 m Patient Age:    83 years         BP:           175/118 mmHg Patient Gender: F                HR:           73 bpm. Exam Location:  Inpatient  Procedure: Limited Echo, Limited Color Doppler and Cardiac Doppler Indications:    pericardial effusion  History:        Patient has prior history of Echocardiogram examinations, most                 recent 12/13/2021. End stage renal disease. Lupus.; Risk                 Factors:Hypertension.  Sonographer:    Johny Chess RDCS Referring Phys: [id number] [place]  1. Left ventricular ejection fraction, by estimation, is 25 to 30%. The left ventricle has severely decreased function. The left ventricle demonstrates global hypokinesis.  2. Right ventricular systolic function is moderately reduced. The right ventricular size is normal. There is normal pulmonary artery systolic pressure. The estimated right ventricular systolic pressure is 81.8 mmHg.  3. Moderate pericardial effusion. The pericardial effusion is circumferential.  4. The mitral valve is normal in structure. Mild mitral valve regurgitation. No evidence of mitral stenosis.  5. The aortic valve is normal in structure. Aortic valve regurgitation is trivial. No aortic stenosis is present.  6. The inferior vena cava is normal in size with greater than 50% respiratory variability, suggesting right atrial pressure of 3 mmHg. FINDINGS  Left Ventricle: Left ventricular ejection fraction, by estimation, is 25 to 30%. The left ventricle has severely decreased function. The left ventricle demonstrates global hypokinesis. The left ventricular internal cavity size was normal in size. There is no left ventricular hypertrophy. Right Ventricle: The right ventricular size is normal. No increase in right ventricular wall thickness. Right ventricular systolic function is moderately reduced. There is normal pulmonary artery systolic pressure. The tricuspid regurgitant velocity is 2.74 m/s, and with an assumed right atrial pressure of 3 mmHg, the estimated right ventricular systolic pressure is 29.9 mmHg. Left Atrium: Left atrial size was normal in size.  Right Atrium: Right atrial size was normal in size. Pericardium: A moderately sized pericardial effusion is present. The pericardial effusion is circumferential. Mitral Valve: The mitral valve is normal in structure. Mild mitral valve regurgitation. No evidence of mitral valve stenosis. Tricuspid Valve: The tricuspid valve is normal in structure. Tricuspid valve regurgitation is mild . No evidence of tricuspid stenosis. Aortic Valve: The aortic valve is normal in structure. Aortic valve regurgitation is trivial. No aortic stenosis is present. Pulmonic Valve: The pulmonic valve was normal in structure. Pulmonic valve regurgitation is trivial. No evidence of pulmonic stenosis. Aorta: The aortic root is normal in size and structure. Venous: The inferior vena cava is normal in size with greater than 50% respiratory variability, suggesting right atrial pressure of 3 mmHg. IAS/Shunts: No atrial level shunt detected by color flow Doppler. LEFT VENTRICLE PLAX 2D LVIDd:         4.90 cm Diastology LVIDs:         4.00 cm  LV e' medial:  6.31 cm/s LV PW:         1.50 cm LV e' lateral: 5.55 cm/s LV IVS:        1.10 cm  IVC IVC diam: 1.50 cm  AORTA Ao Asc diam: 3.20 cm TRICUSPID VALVE TR Peak grad:   30.0 mmHg TR Vmax:        274.00 cm/s Fransico Him MD Electronically signed by Fransico Him MD Signature Date/Time: 12/27/2021/10:32:35 AM    Final    CT CHEST ABDOMEN PELVIS WO CONTRAST  Result Date: 12/27/2021 CLINICAL DATA:  None traumatic chest wall pain. Concern for infection or inflammation. EXAM: CT CHEST, ABDOMEN AND PELVIS WITHOUT CONTRAST TECHNIQUE: Multidetector CT imaging of the chest, abdomen and pelvis was performed following the standard protocol without IV contrast. RADIATION DOSE REDUCTION: This exam was performed according to the departmental dose-optimization program which includes automated exposure control, adjustment of the mA and/or kV according to patient size and/or use of iterative reconstruction  technique. COMPARISON:  CT of the abdomen pelvis dated 12/13/2021. FINDINGS: Evaluation of this exam is limited in the absence of intravenous contrast. Evaluation is also limited due to streak artifact caused by overlying support wires. CT CHEST FINDINGS Cardiovascular: There is cardiomegaly. Dialysis catheter with tip at the cavoatrial junction. There is moderate pericardial effusion measuring 2.3 cm in thickness, increased since the prior CT. Correlation with echocardiogram recommended to exclude cardiac tamponade. The thoracic aorta and central pulmonary arteries are grossly unremarkable but poorly visualized. Mediastinum/Nodes: No hilar or mediastinal adenopathy. The esophagus and the thyroid gland are grossly unremarkable. No mediastinal fluid collection. Lungs/Pleura: Small left pleural effusion. There is diffuse interstitial and interlobular septal prominence of the left lung concerning for asymmetric edema. Patchy area of consolidation with air bronchogram in the right middle lobe consistent with atelectasis or pneumonia. No pneumothorax. The central airways are patent. Musculoskeletal: No chest wall mass or suspicious bone lesions identified. CT ABDOMEN PELVIS FINDINGS No intra-abdominal free air.  Small ascites. Hepatobiliary: The liver is unremarkable. No intrahepatic biliary dilatation. No calcified gallstone. Pancreas: Unremarkable. No pancreatic ductal dilatation or surrounding inflammatory changes. Spleen: Normal in size without focal abnormality. Adrenals/Urinary Tract: The adrenal glands unremarkable. There is no hydronephrosis or nephrolithiasis on either side. The visualized ureters and urinary bladder appear unremarkable. Stomach/Bowel: There is no bowel obstruction. The appendix is unremarkable as visualized. Vascular/Lymphatic: The abdominal aorta and IVC are grossly unremarkable. No portal venous gas. Reproductive: The uterus is anteverted. Bilateral tubal ligation clips. Other: None  Musculoskeletal: No acute or significant osseous findings. IMPRESSION: 1. Cardiomegaly with moderate pericardial effusion, increased since the prior CT. Correlation with echocardiogram recommended to exclude cardiac tamponade. 2. Small left pleural effusion. 3. Patchy area of consolidation with air bronchogram in the right middle lobe consistent with atelectasis or pneumonia. 4. Small ascites. 5. No hydronephrosis or nephrolithiasis. 6. No bowel obstruction. Electronically Signed   By: Anner Crete M.D.   On: 12/27/2021 02:36      Subjective: Patient seen and examined at the bedside this morning.  Hemodynamically stable for discharge  Discharge Exam: Vitals:   01/09/22 0734 01/09/22 0829  BP: (!) 172/110 (!) 167/119  Pulse: 72   Resp: 16   Temp: 98.1 F (36.7 C)   SpO2: 100%    Vitals:   01/09/22 0430 01/09/22 0500 01/09/22 0734 01/09/22 0829  BP: (!) 168/109  (!) 172/110 (!) 167/119  Pulse: 62  72   Resp: 14  16   Temp: (!)  97.5 F (36.4 C)  98.1 F (36.7 C)   TempSrc: Oral  Oral   SpO2: 100%  100%   Weight:  61.6 kg    Height:        General: Pt is alert, awake, not in acute distress Cardiovascular: RRR, S1/S2 +, no rubs, no gallops Respiratory: CTA bilaterally, no wheezing, no rhonchi Abdominal: Soft, NT, ND, bowel sounds +, colostomy Extremities: no edema, no cyanosis    The results of significant diagnostics from this hospitalization (including imaging, microbiology, ancillary and laboratory) are listed below for reference.     Microbiology: Recent Results (from the past 240 hour(s))  Resp Panel by RT-PCR (Flu A&B, Covid) Nasopharyngeal Swab     Status: None   Collection Time: 01/06/22  6:37 AM   Specimen: Nasopharyngeal Swab; Nasopharyngeal(NP) swabs in vial transport medium  Result Value Ref Range Status   SARS Coronavirus 2 by RT PCR NEGATIVE NEGATIVE Final    Comment: (NOTE) SARS-CoV-2 target nucleic acids are NOT DETECTED.  The SARS-CoV-2 RNA is  generally detectable in upper respiratory specimens during the acute phase of infection. The lowest concentration of SARS-CoV-2 viral copies this assay can detect is 138 copies/mL. A negative result does not preclude SARS-Cov-2 infection and should not be used as the sole basis for treatment or other patient management decisions. A negative result may occur with  improper specimen collection/handling, submission of specimen other than nasopharyngeal swab, presence of viral mutation(s) within the areas targeted by this assay, and inadequate number of viral copies(<138 copies/mL). A negative result must be combined with clinical observations, patient history, and epidemiological information. The expected result is Negative.  Fact Sheet for Patients:  EntrepreneurPulse.com.au  Fact Sheet for Healthcare Providers:  IncredibleEmployment.be  This test is no t yet approved or cleared by the [place] FDA and  has been authorized for detection and/or diagnosis of SARS-CoV-2 by FDA under an Emergency Use Authorization (EUA). This EUA will remain  in effect (meaning this test can be used) for the duration of the COVID-19 declaration under Section 564(b)(1) of the Act, 21 U.S.C.section 360bbb-3(b)(1), unless the authorization is terminated  or revoked sooner.       Influenza A by PCR NEGATIVE NEGATIVE Final   Influenza B by PCR NEGATIVE NEGATIVE Final    Comment: (NOTE) The Xpert Xpress SARS-CoV-2/FLU/RSV plus assay is intended as an aid in the diagnosis of influenza from Nasopharyngeal swab specimens and should not be used as a sole basis for treatment. Nasal washings and aspirates are unacceptable for Xpert Xpress SARS-CoV-2/FLU/RSV testing.  Fact Sheet for Patients: EntrepreneurPulse.com.au  Fact Sheet for Healthcare Providers: IncredibleEmployment.be  This test is not yet approved or cleared by the [place] FDA and has been authorized for detection and/or diagnosis of SARS-CoV-2 by FDA under an Emergency Use Authorization (EUA). This EUA will remain in effect (meaning this test can be used) for the duration of the COVID-19 declaration under Section 564(b)(1) of the Act, 21 U.S.C. section 360bbb-3(b)(1), unless the authorization is terminated or revoked.  Performed at [facility], [place] [address] [place], [place] [address]   Expectorated Sputum Assessment w Gram Stain, Rflx to Resp Cult     Status: None (Preliminary result)   Collection Time: 01/07/22 11:53 AM   Specimen: Expectorated Sputum  Result Value Ref Range Status   Specimen Description EXPECTORATED SPUTUM  Final   Special Requests NONE  Final   Sputum evaluation   Final    Sputum specimen  not acceptable for testing.  Please recollect.   RESULT CALLED TO, READ BACK BY AND VERIFIED WITH: RN Storm Frisk 01/09/22_0 :25 BY TW Performed at [facility], [place] [address], [place], [place] [address]    Report Status PENDING  Incomplete     Labs: BNP (last 3 results) Recent Labs    01/18/21 1952 12/13/21 1005 12/26/21 2049  BNP >4,500.0* >4,500.0* >9,794.8*   Basic Metabolic Panel: Recent Labs  Lab 01/05/22 0006 01/05/22 1953 01/05/22 2000 01/06/22 1937 01/07/22 0132 01/08/22 0444  NA 135 134* 132* 135 133* 135  K 5.2* 6.0* 6.0* 5.0 5.1 4.3  CL 96* 97*  --  97* 96* 97*  CO2 25 23  --  _1 GLUCOSE 100* 109*  --  106* 88 105*  BUN 83* 80*  --  48* 54* 35*  CREATININE 8.87* 8.71*  --  5.86* 6.46* 4.54*  CALCIUM 9.2 8.9  --  7.8* 7.7* 8.3*  PHOS 1.5*  --   --  2.8 2.8 2.5   Liver Function Tests: Recent Labs  Lab 01/05/22 0006 01/06/22 1937 01/07/22 0132 01/08/22 0444  AST  --  21  --   --   ALT  --  21  --   --   ALKPHOS  --  210*  --   --   BILITOT  --  0.2*  --   --   PROT  --  6.1*  --   --   ALBUMIN 2.4* 2.6* 2.5* 2.4*   Recent Labs  Lab 01/06/22 1937  LIPASE 42   No results  for input(s): AMMONIA in the last 168 hours. CBC: Recent Labs  Lab 01/05/22 0005 01/05/22 1953 01/05/22 2000 01/07/22 0132 01/08/22 0444  WBC 11.7* 11.6*  --  8.4 9.0  HGB 8.6* 9.1* 9.5* 8.0* 8.2*  HCT 27.7* 29.8* 28.0* 25.2* 26.1*  MCV 88.8 90.0  --  89.4 88.2  PLT 416* 378  --  294 333   Cardiac Enzymes: No results for input(s): CKTOTAL, CKMB, CKMBINDEX, TROPONINI in the last 168 hours. BNP: Invalid input(s): POCBNP CBG: Recent Labs  Lab 01/05/22 2134 01/06/22 1250 01/07/22 2241 01/08/22 1600  GLUCAP 220* 94 127* 132*   D-Dimer No results for input(s): DDIMER in the last 72 hours. Hgb A1c No results for input(s): HGBA1C in the last 72 hours. Lipid Profile No results for input(s): CHOL, HDL, LDLCALC, TRIG, CHOLHDL, LDLDIRECT in the last 72 hours. Thyroid function studies No results for input(s): TSH, T4TOTAL, T3FREE, THYROIDAB in the last 72 hours.  Invalid input(s): FREET3 Anemia work up Recent Labs    01/07/22 1841  FERRITIN 987*  TIBC 277  IRON 46   Urinalysis    Component Value Date/Time   COLORURINE YELLOW 11/29/2020 2054   APPEARANCEUR HAZY (A) 11/29/2020 2054   LABSPEC 1.015 11/29/2020 2054   PHURINE 6.0 11/29/2020 2054   GLUCOSEU NEGATIVE 11/29/2020 2054   HGBUR MODERATE (A) 11/29/2020 2054   [place] NEGATIVE 11/29/2020 2054   KETONESUR NEGATIVE 11/29/2020 2054   PROTEINUR >=300 (A) 11/29/2020 2054   UROBILINOGEN 0.2 10/17/2020 1026   NITRITE NEGATIVE 11/29/2020 2054   LEUKOCYTESUR NEGATIVE 11/29/2020 2054   Sepsis Labs Invalid input(s): PROCALCITONIN,  WBC,  LACTICIDVEN Microbiology Recent Results (from the past 240 hour(s))  Resp Panel by RT-PCR (Flu A&B, Covid) Nasopharyngeal Swab     Status: None   Collection Time: 01/06/22  6:37 AM   Specimen: Nasopharyngeal Swab; Nasopharyngeal(NP) swabs in vial transport  medium  Result Value Ref Range Status   SARS Coronavirus 2 by RT PCR NEGATIVE NEGATIVE Final    Comment: (NOTE) SARS-CoV-2  target nucleic acids are NOT DETECTED.  The SARS-CoV-2 RNA is generally detectable in upper respiratory specimens during the acute phase of infection. The lowest concentration of SARS-CoV-2 viral copies this assay can detect is 138 copies/mL. A negative result does not preclude SARS-Cov-2 infection and should not be used as the sole basis for treatment or other patient management decisions. A negative result may occur with  improper specimen collection/handling, submission of specimen other than nasopharyngeal swab, presence of viral mutation(s) within the areas targeted by this assay, and inadequate number of viral copies(<138 copies/mL). A negative result must be combined with clinical observations, patient history, and epidemiological information. The expected result is Negative.  Fact Sheet for Patients:  EntrepreneurPulse.com.au  Fact Sheet for Healthcare Providers:  IncredibleEmployment.be  This test is no t yet approved or cleared by the [place] FDA and  has been authorized for detection and/or diagnosis of SARS-CoV-2 by FDA under an Emergency Use Authorization (EUA). This EUA will remain  in effect (meaning this test can be used) for the duration of the COVID-19 declaration under Section 564(b)(1) of the Act, 21 U.S.C.section 360bbb-3(b)(1), unless the authorization is terminated  or revoked sooner.       Influenza A by PCR NEGATIVE NEGATIVE Final   Influenza B by PCR NEGATIVE NEGATIVE Final    Comment: (NOTE) The Xpert Xpress SARS-CoV-2/FLU/RSV plus assay is intended as an aid in the diagnosis of influenza from Nasopharyngeal swab specimens and should not be used as a sole basis for treatment. Nasal washings and aspirates are unacceptable for Xpert Xpress SARS-CoV-2/FLU/RSV testing.  Fact Sheet for Patients: EntrepreneurPulse.com.au  Fact Sheet for Healthcare  Providers: IncredibleEmployment.be  This test is not yet approved or cleared by the [place] FDA and has been authorized for detection and/or diagnosis of SARS-CoV-2 by FDA under an Emergency Use Authorization (EUA). This EUA will remain in effect (meaning this test can be used) for the duration of the COVID-19 declaration under Section 564(b)(1) of the Act, 21 U.S.C. section 360bbb-3(b)(1), unless the authorization is terminated or revoked.  Performed at [facility], [place] [address] [place], [place] [address]   Expectorated Sputum Assessment w Gram Stain, Rflx to Resp Cult     Status: None (Preliminary result)   Collection Time: 01/07/22 11:53 AM   Specimen: Expectorated Sputum  Result Value Ref Range Status   Specimen Description EXPECTORATED SPUTUM  Final   Special Requests NONE  Final   Sputum evaluation   Final    Sputum specimen not acceptable for testing.  Please recollect.   RESULT CALLED TO, READ BACK BY AND VERIFIED WITH: RN Storm Frisk 01/09/22_0 :25 BY TW Performed at [facility], [place] [address], [place], [place] [address]    Report Status PENDING  Incomplete    Please note: You were cared for by a hospitalist during your hospital stay. Once you are discharged, your primary care physician will handle any further medical issues. Please note that NO REFILLS for any discharge medications will be authorized once you are discharged, as it is imperative that you return to your primary care physician (or establish a relationship with a primary care physician if you do not have one) for your post hospital discharge needs so that they can reassess your need for medications and monitor your lab values.    Time coordinating  discharge: 40 minutes  SIGNED:   Shelly Coss, MD  [facility] 01/09/2022, 10:43 AM Pager [phone]  If 7PM-7AM, please contact night-coverage [email or web address] Password [email or web address]

## 2022-01-09 NOTE — TOC Transition Note (Signed)
Transition of Care [facility]) - CM/SW Discharge Note   Patient Details  Name: Cathy [name] MRN: [id number] Date of Birth: Apr 17, 1981  [facility] CM/SW Contact:  Verdell Carmine, RN Phone Number: 01/09/2022, 12:22 PM   Clinical Narrative:     DC today, Appointments made previously See Patient instructions. Set up with OP dialysis tomorrow and transportation arranged. NO further needs identified.        Patient Goals and CMS Choice        Discharge Placement  Home self care                     Discharge Plan and Services                                     Social Determinants of Health (SDOH) Interventions     Readmission Risk Interventions No flowsheet data found.

## 2022-01-09 NOTE — Care Management Important Message (Signed)
Important Message  Patient Details  Name: Cathy [name] MRN: [id number] Date of Birth: 1981-10-01   Medicare Important Message Given:     Patient was discharged prior to IM delivery.  Will mail to the patient home address.    Karolina Zamor 01/09/2022, 2:57 PM

## 2022-01-09 NOTE — Plan of Care (Signed)

## 2022-01-09 NOTE — Progress Notes (Signed)
Contacted attending and nephrology this am due to pt's likely d/c for today. Providers agreeable to pt receiving out-pt HD tomorrow to avoid day of d/c HD. Contacted [facility] and spoke to Nicole/Anne. Pt can receive treatment tomorrow with 5:40 arrival/6:00 chair time. Spoke to pt via phone. Pt agreeable to treatment tomorrow at clinic and states transportation needed. Appt made with medicaid transport this am for pt's transport for tomorrow am. Pt aware and agreeable. Pt for d/c later today and to receive out-pt HD tomorrow. Anne at [facility] aware pt to be at appt tomorrow and transport arranged. Update provided to physicians, RN, and inpt HD unit.  Melven Sartorius Renal Navigator [phone]

## 2022-01-09 NOTE — Progress Notes (Signed)
Patients ride is here

## 2022-01-10 DIAGNOSIS — N2581 Secondary hyperparathyroidism of renal origin: Secondary | ICD-10-CM | POA: Diagnosis not present

## 2022-01-10 DIAGNOSIS — N186 End stage renal disease: Secondary | ICD-10-CM | POA: Diagnosis not present

## 2022-01-10 DIAGNOSIS — R52 Pain, unspecified: Secondary | ICD-10-CM | POA: Diagnosis not present

## 2022-01-10 DIAGNOSIS — E8770 Fluid overload, unspecified: Secondary | ICD-10-CM | POA: Diagnosis not present

## 2022-01-10 DIAGNOSIS — Z7689 Persons encountering health services in other specified circumstances: Secondary | ICD-10-CM | POA: Diagnosis not present

## 2022-01-10 DIAGNOSIS — Z992 Dependence on renal dialysis: Secondary | ICD-10-CM | POA: Diagnosis not present

## 2022-01-10 DIAGNOSIS — D509 Iron deficiency anemia, unspecified: Secondary | ICD-10-CM | POA: Diagnosis not present

## 2022-01-10 DIAGNOSIS — T8249XD Other complication of vascular dialysis catheter, subsequent encounter: Secondary | ICD-10-CM | POA: Diagnosis not present

## 2022-01-10 DIAGNOSIS — D649 Anemia, unspecified: Secondary | ICD-10-CM | POA: Diagnosis not present

## 2022-01-10 DIAGNOSIS — D689 Coagulation defect, unspecified: Secondary | ICD-10-CM | POA: Diagnosis not present

## 2022-01-11 DIAGNOSIS — R52 Pain, unspecified: Secondary | ICD-10-CM | POA: Diagnosis not present

## 2022-01-11 DIAGNOSIS — N2581 Secondary hyperparathyroidism of renal origin: Secondary | ICD-10-CM | POA: Diagnosis not present

## 2022-01-11 DIAGNOSIS — T8249XD Other complication of vascular dialysis catheter, subsequent encounter: Secondary | ICD-10-CM | POA: Diagnosis not present

## 2022-01-11 DIAGNOSIS — E8770 Fluid overload, unspecified: Secondary | ICD-10-CM | POA: Diagnosis not present

## 2022-01-11 DIAGNOSIS — Z7689 Persons encountering health services in other specified circumstances: Secondary | ICD-10-CM | POA: Diagnosis not present

## 2022-01-11 DIAGNOSIS — D509 Iron deficiency anemia, unspecified: Secondary | ICD-10-CM | POA: Diagnosis not present

## 2022-01-11 DIAGNOSIS — Z992 Dependence on renal dialysis: Secondary | ICD-10-CM | POA: Diagnosis not present

## 2022-01-11 DIAGNOSIS — N186 End stage renal disease: Secondary | ICD-10-CM | POA: Diagnosis not present

## 2022-01-11 DIAGNOSIS — D649 Anemia, unspecified: Secondary | ICD-10-CM | POA: Diagnosis not present

## 2022-01-11 DIAGNOSIS — D689 Coagulation defect, unspecified: Secondary | ICD-10-CM | POA: Diagnosis not present

## 2022-01-13 LAB — EXPECTORATED SPUTUM ASSESSMENT W GRAM STAIN, RFLX TO RESP C

## 2022-01-14 ENCOUNTER — Other Ambulatory Visit (HOSPITAL_BASED_OUTPATIENT_CLINIC_OR_DEPARTMENT_OTHER): Payer: Self-pay

## 2022-01-14 DIAGNOSIS — N186 End stage renal disease: Secondary | ICD-10-CM | POA: Diagnosis not present

## 2022-01-14 DIAGNOSIS — Z7689 Persons encountering health services in other specified circumstances: Secondary | ICD-10-CM | POA: Diagnosis not present

## 2022-01-14 DIAGNOSIS — D689 Coagulation defect, unspecified: Secondary | ICD-10-CM | POA: Diagnosis not present

## 2022-01-14 DIAGNOSIS — R52 Pain, unspecified: Secondary | ICD-10-CM | POA: Diagnosis not present

## 2022-01-14 DIAGNOSIS — T8249XD Other complication of vascular dialysis catheter, subsequent encounter: Secondary | ICD-10-CM | POA: Diagnosis not present

## 2022-01-14 DIAGNOSIS — N2581 Secondary hyperparathyroidism of renal origin: Secondary | ICD-10-CM | POA: Diagnosis not present

## 2022-01-14 DIAGNOSIS — E8770 Fluid overload, unspecified: Secondary | ICD-10-CM | POA: Diagnosis not present

## 2022-01-14 DIAGNOSIS — D509 Iron deficiency anemia, unspecified: Secondary | ICD-10-CM | POA: Diagnosis not present

## 2022-01-14 DIAGNOSIS — D649 Anemia, unspecified: Secondary | ICD-10-CM | POA: Diagnosis not present

## 2022-01-14 DIAGNOSIS — Z992 Dependence on renal dialysis: Secondary | ICD-10-CM | POA: Diagnosis not present

## 2022-01-16 DIAGNOSIS — D509 Iron deficiency anemia, unspecified: Secondary | ICD-10-CM | POA: Diagnosis not present

## 2022-01-16 DIAGNOSIS — D689 Coagulation defect, unspecified: Secondary | ICD-10-CM | POA: Diagnosis not present

## 2022-01-16 DIAGNOSIS — N186 End stage renal disease: Secondary | ICD-10-CM | POA: Diagnosis not present

## 2022-01-16 DIAGNOSIS — T8249XD Other complication of vascular dialysis catheter, subsequent encounter: Secondary | ICD-10-CM | POA: Diagnosis not present

## 2022-01-16 DIAGNOSIS — R52 Pain, unspecified: Secondary | ICD-10-CM | POA: Diagnosis not present

## 2022-01-16 DIAGNOSIS — Z992 Dependence on renal dialysis: Secondary | ICD-10-CM | POA: Diagnosis not present

## 2022-01-16 DIAGNOSIS — D649 Anemia, unspecified: Secondary | ICD-10-CM | POA: Diagnosis not present

## 2022-01-16 DIAGNOSIS — E8770 Fluid overload, unspecified: Secondary | ICD-10-CM | POA: Diagnosis not present

## 2022-01-16 DIAGNOSIS — N2581 Secondary hyperparathyroidism of renal origin: Secondary | ICD-10-CM | POA: Diagnosis not present

## 2022-01-16 DIAGNOSIS — Z7689 Persons encountering health services in other specified circumstances: Secondary | ICD-10-CM | POA: Diagnosis not present

## 2022-01-17 ENCOUNTER — Other Ambulatory Visit (HOSPITAL_COMMUNITY): Payer: Self-pay

## 2022-01-18 DIAGNOSIS — D689 Coagulation defect, unspecified: Secondary | ICD-10-CM | POA: Diagnosis not present

## 2022-01-18 DIAGNOSIS — E8770 Fluid overload, unspecified: Secondary | ICD-10-CM | POA: Diagnosis not present

## 2022-01-18 DIAGNOSIS — R52 Pain, unspecified: Secondary | ICD-10-CM | POA: Diagnosis not present

## 2022-01-18 DIAGNOSIS — N2581 Secondary hyperparathyroidism of renal origin: Secondary | ICD-10-CM | POA: Diagnosis not present

## 2022-01-18 DIAGNOSIS — D509 Iron deficiency anemia, unspecified: Secondary | ICD-10-CM | POA: Diagnosis not present

## 2022-01-18 DIAGNOSIS — D649 Anemia, unspecified: Secondary | ICD-10-CM | POA: Diagnosis not present

## 2022-01-18 DIAGNOSIS — T8249XD Other complication of vascular dialysis catheter, subsequent encounter: Secondary | ICD-10-CM | POA: Diagnosis not present

## 2022-01-18 DIAGNOSIS — Z992 Dependence on renal dialysis: Secondary | ICD-10-CM | POA: Diagnosis not present

## 2022-01-18 DIAGNOSIS — N186 End stage renal disease: Secondary | ICD-10-CM | POA: Diagnosis not present

## 2022-01-18 DIAGNOSIS — Z7689 Persons encountering health services in other specified circumstances: Secondary | ICD-10-CM | POA: Diagnosis not present

## 2022-01-21 DIAGNOSIS — D509 Iron deficiency anemia, unspecified: Secondary | ICD-10-CM | POA: Diagnosis not present

## 2022-01-21 DIAGNOSIS — D649 Anemia, unspecified: Secondary | ICD-10-CM | POA: Diagnosis not present

## 2022-01-21 DIAGNOSIS — E8770 Fluid overload, unspecified: Secondary | ICD-10-CM | POA: Diagnosis not present

## 2022-01-21 DIAGNOSIS — R52 Pain, unspecified: Secondary | ICD-10-CM | POA: Diagnosis not present

## 2022-01-21 DIAGNOSIS — T8249XD Other complication of vascular dialysis catheter, subsequent encounter: Secondary | ICD-10-CM | POA: Diagnosis not present

## 2022-01-21 DIAGNOSIS — Z7689 Persons encountering health services in other specified circumstances: Secondary | ICD-10-CM | POA: Diagnosis not present

## 2022-01-21 DIAGNOSIS — N186 End stage renal disease: Secondary | ICD-10-CM | POA: Diagnosis not present

## 2022-01-21 DIAGNOSIS — D689 Coagulation defect, unspecified: Secondary | ICD-10-CM | POA: Diagnosis not present

## 2022-01-21 DIAGNOSIS — Z992 Dependence on renal dialysis: Secondary | ICD-10-CM | POA: Diagnosis not present

## 2022-01-21 DIAGNOSIS — N2581 Secondary hyperparathyroidism of renal origin: Secondary | ICD-10-CM | POA: Diagnosis not present

## 2022-01-22 ENCOUNTER — Other Ambulatory Visit (HOSPITAL_COMMUNITY): Payer: Self-pay

## 2022-01-22 ENCOUNTER — Telehealth (HOSPITAL_COMMUNITY): Payer: Self-pay

## 2022-01-22 NOTE — Telephone Encounter (Signed)
Pharmacy Transitions of Care Follow-up Telephone Call  Date of discharge: 01/09/2022  Discharge Diagnosis: Pulmonary Embolism  How have you been since you were released from the hospital? Patient reports doing well. No questions or concerns at this time.    Medication changes made at discharge:  - START: Eliquis Starter Pack   Medication changes verified by the patient? Yes (Yes/No)   Medication Accessibility:  Home Pharmacy: [id number] - [address]   Was the patient provided with refills on discharged medications? No   Have all prescriptions been transferred from [facility] to home pharmacy? N/A   Is the patient able to afford medications? Patient has insurance  Medication Review: APIXABAN (ELIQUIS)  Apixaban 10 mg BID initiated on 01/08/2022. Will switch to apixaban 5 mg after 7 days (01/15/2022).  - Discussed importance of taking medication around the same time everyday  - Advised patient of medications to avoid (NSAIDs, ASA)  - Educated that Tylenol (acetaminophen) will be the preferred analgesic to prevent risk of bleeding  - Emphasized importance of monitoring for signs and symptoms of bleeding (abnormal bruising, prolonged bleeding, nose bleeds, bleeding from gums, discolored urine, black tarry stools)  - Advised patient to alert all providers of anticoagulation therapy prior to starting a new medication or having a procedure   Follow-up Appointments:  PCP Hospital f/u appt confirmed? Internal Medicine Scheduled to see Ms. Army Melia on 02/12/2022 @ 2:30.   If their condition worsens, is the pt aware to call PCP or go to the Emergency Dept.? Yes  Final Patient Assessment: Patient states she was educated regarding Eliquis at discharge. Verified how patient was taking medication and she told me initially she was taking 10 mg twice daily though currently she was taking 5 mg daily per prescription. I reviewed dispensed prescription in [place] and it was dispensed as 5 mg daily.  Provider was contacted to clarify dose and he stated she should have been on 5 mg BID. Patient was called and made aware of updated dose. Patient is now taking 5mg  twice daily. Patient's PCP was messaged to be made aware of the change in dosing. TOC pharmacy was also called to be made aware of the change.

## 2022-01-23 ENCOUNTER — Other Ambulatory Visit (HOSPITAL_COMMUNITY): Payer: Self-pay

## 2022-01-23 DIAGNOSIS — E8770 Fluid overload, unspecified: Secondary | ICD-10-CM | POA: Diagnosis not present

## 2022-01-23 DIAGNOSIS — D689 Coagulation defect, unspecified: Secondary | ICD-10-CM | POA: Diagnosis not present

## 2022-01-23 DIAGNOSIS — N186 End stage renal disease: Secondary | ICD-10-CM | POA: Diagnosis not present

## 2022-01-23 DIAGNOSIS — D509 Iron deficiency anemia, unspecified: Secondary | ICD-10-CM | POA: Diagnosis not present

## 2022-01-23 DIAGNOSIS — Z7689 Persons encountering health services in other specified circumstances: Secondary | ICD-10-CM | POA: Diagnosis not present

## 2022-01-23 DIAGNOSIS — D649 Anemia, unspecified: Secondary | ICD-10-CM | POA: Diagnosis not present

## 2022-01-23 DIAGNOSIS — Z992 Dependence on renal dialysis: Secondary | ICD-10-CM | POA: Diagnosis not present

## 2022-01-23 DIAGNOSIS — T8249XD Other complication of vascular dialysis catheter, subsequent encounter: Secondary | ICD-10-CM | POA: Diagnosis not present

## 2022-01-23 DIAGNOSIS — N2581 Secondary hyperparathyroidism of renal origin: Secondary | ICD-10-CM | POA: Diagnosis not present

## 2022-01-23 DIAGNOSIS — R52 Pain, unspecified: Secondary | ICD-10-CM | POA: Diagnosis not present

## 2022-01-24 ENCOUNTER — Other Ambulatory Visit (HOSPITAL_COMMUNITY): Payer: Self-pay

## 2022-01-25 DIAGNOSIS — Z992 Dependence on renal dialysis: Secondary | ICD-10-CM | POA: Diagnosis not present

## 2022-01-25 DIAGNOSIS — N186 End stage renal disease: Secondary | ICD-10-CM | POA: Diagnosis not present

## 2022-01-25 DIAGNOSIS — Z7689 Persons encountering health services in other specified circumstances: Secondary | ICD-10-CM | POA: Diagnosis not present

## 2022-01-25 DIAGNOSIS — D689 Coagulation defect, unspecified: Secondary | ICD-10-CM | POA: Diagnosis not present

## 2022-01-25 DIAGNOSIS — T8249XD Other complication of vascular dialysis catheter, subsequent encounter: Secondary | ICD-10-CM | POA: Diagnosis not present

## 2022-01-25 DIAGNOSIS — D509 Iron deficiency anemia, unspecified: Secondary | ICD-10-CM | POA: Diagnosis not present

## 2022-01-25 DIAGNOSIS — R52 Pain, unspecified: Secondary | ICD-10-CM | POA: Diagnosis not present

## 2022-01-25 DIAGNOSIS — E8770 Fluid overload, unspecified: Secondary | ICD-10-CM | POA: Diagnosis not present

## 2022-01-25 DIAGNOSIS — D649 Anemia, unspecified: Secondary | ICD-10-CM | POA: Diagnosis not present

## 2022-01-25 DIAGNOSIS — N2581 Secondary hyperparathyroidism of renal origin: Secondary | ICD-10-CM | POA: Diagnosis not present

## 2022-01-27 ENCOUNTER — Other Ambulatory Visit (HOSPITAL_COMMUNITY): Payer: Self-pay

## 2022-01-27 DIAGNOSIS — T8249XD Other complication of vascular dialysis catheter, subsequent encounter: Secondary | ICD-10-CM | POA: Diagnosis not present

## 2022-01-27 DIAGNOSIS — D649 Anemia, unspecified: Secondary | ICD-10-CM | POA: Diagnosis not present

## 2022-01-27 DIAGNOSIS — Z992 Dependence on renal dialysis: Secondary | ICD-10-CM | POA: Diagnosis not present

## 2022-01-27 DIAGNOSIS — D509 Iron deficiency anemia, unspecified: Secondary | ICD-10-CM | POA: Diagnosis not present

## 2022-01-27 DIAGNOSIS — N2581 Secondary hyperparathyroidism of renal origin: Secondary | ICD-10-CM | POA: Diagnosis not present

## 2022-01-27 DIAGNOSIS — N186 End stage renal disease: Secondary | ICD-10-CM | POA: Diagnosis not present

## 2022-01-27 DIAGNOSIS — R52 Pain, unspecified: Secondary | ICD-10-CM | POA: Diagnosis not present

## 2022-01-27 DIAGNOSIS — D689 Coagulation defect, unspecified: Secondary | ICD-10-CM | POA: Diagnosis not present

## 2022-01-27 DIAGNOSIS — Z7689 Persons encountering health services in other specified circumstances: Secondary | ICD-10-CM | POA: Diagnosis not present

## 2022-01-27 DIAGNOSIS — E8770 Fluid overload, unspecified: Secondary | ICD-10-CM | POA: Diagnosis not present

## 2022-01-28 DIAGNOSIS — Z992 Dependence on renal dialysis: Secondary | ICD-10-CM | POA: Diagnosis not present

## 2022-01-28 DIAGNOSIS — N186 End stage renal disease: Secondary | ICD-10-CM | POA: Diagnosis not present

## 2022-01-28 DIAGNOSIS — Z7689 Persons encountering health services in other specified circumstances: Secondary | ICD-10-CM | POA: Diagnosis not present

## 2022-01-28 DIAGNOSIS — M3214 Glomerular disease in systemic lupus erythematosus: Secondary | ICD-10-CM | POA: Diagnosis not present

## 2022-01-29 ENCOUNTER — Inpatient Hospital Stay (HOSPITAL_COMMUNITY)
Admission: EM | Admit: 2022-01-29 | Discharge: 2022-02-08 | DRG: 314 | Disposition: A | Discharge status: Home / Self Care | Payer: Medicare Other | Attending: Internal Medicine | Admitting: Internal Medicine

## 2022-01-29 ENCOUNTER — Emergency Department (HOSPITAL_COMMUNITY): Payer: Medicare Other

## 2022-01-29 ENCOUNTER — Other Ambulatory Visit: Payer: Self-pay

## 2022-01-29 ENCOUNTER — Encounter (HOSPITAL_COMMUNITY): Payer: Self-pay

## 2022-01-29 ENCOUNTER — Inpatient Hospital Stay (HOSPITAL_COMMUNITY): Payer: Medicare Other

## 2022-01-29 DIAGNOSIS — Y712 Prosthetic and other implants, materials and accessory cardiovascular devices associated with adverse incidents: Secondary | ICD-10-CM | POA: Diagnosis present

## 2022-01-29 DIAGNOSIS — F419 Anxiety disorder, unspecified: Secondary | ICD-10-CM | POA: Diagnosis present

## 2022-01-29 DIAGNOSIS — Z7901 Long term (current) use of anticoagulants: Secondary | ICD-10-CM | POA: Diagnosis not present

## 2022-01-29 DIAGNOSIS — I130 Hypertensive heart and chronic kidney disease with heart failure and stage 1 through stage 4 chronic kidney disease, or unspecified chronic kidney disease: Secondary | ICD-10-CM | POA: Diagnosis not present

## 2022-01-29 DIAGNOSIS — R519 Headache, unspecified: Secondary | ICD-10-CM | POA: Diagnosis not present

## 2022-01-29 DIAGNOSIS — T80211A Bloodstream infection due to central venous catheter, initial encounter: Secondary | ICD-10-CM | POA: Diagnosis not present

## 2022-01-29 DIAGNOSIS — I5042 Chronic combined systolic (congestive) and diastolic (congestive) heart failure: Secondary | ICD-10-CM | POA: Diagnosis present

## 2022-01-29 DIAGNOSIS — F17210 Nicotine dependence, cigarettes, uncomplicated: Secondary | ICD-10-CM | POA: Diagnosis present

## 2022-01-29 DIAGNOSIS — Z9115 Patient's noncompliance with renal dialysis: Secondary | ICD-10-CM | POA: Diagnosis not present

## 2022-01-29 DIAGNOSIS — M898X9 Other specified disorders of bone, unspecified site: Secondary | ICD-10-CM | POA: Diagnosis present

## 2022-01-29 DIAGNOSIS — I5022 Chronic systolic (congestive) heart failure: Secondary | ICD-10-CM | POA: Diagnosis present

## 2022-01-29 DIAGNOSIS — I088 Other rheumatic multiple valve diseases: Secondary | ICD-10-CM | POA: Diagnosis not present

## 2022-01-29 DIAGNOSIS — R140 Abdominal distension (gaseous): Secondary | ICD-10-CM

## 2022-01-29 DIAGNOSIS — I2699 Other pulmonary embolism without acute cor pulmonale: Secondary | ICD-10-CM | POA: Diagnosis not present

## 2022-01-29 DIAGNOSIS — D631 Anemia in chronic kidney disease: Secondary | ICD-10-CM | POA: Diagnosis present

## 2022-01-29 DIAGNOSIS — K219 Gastro-esophageal reflux disease without esophagitis: Secondary | ICD-10-CM | POA: Diagnosis present

## 2022-01-29 DIAGNOSIS — Z7952 Long term (current) use of systemic steroids: Secondary | ICD-10-CM

## 2022-01-29 DIAGNOSIS — E875 Hyperkalemia: Secondary | ICD-10-CM | POA: Diagnosis present

## 2022-01-29 DIAGNOSIS — Z4901 Encounter for fitting and adjustment of extracorporeal dialysis catheter: Secondary | ICD-10-CM | POA: Diagnosis not present

## 2022-01-29 DIAGNOSIS — Z992 Dependence on renal dialysis: Secondary | ICD-10-CM | POA: Diagnosis not present

## 2022-01-29 DIAGNOSIS — Z8249 Family history of ischemic heart disease and other diseases of the circulatory system: Secondary | ICD-10-CM

## 2022-01-29 DIAGNOSIS — Z7689 Persons encountering health services in other specified circumstances: Secondary | ICD-10-CM | POA: Diagnosis not present

## 2022-01-29 DIAGNOSIS — R509 Fever, unspecified: Secondary | ICD-10-CM | POA: Diagnosis not present

## 2022-01-29 DIAGNOSIS — I071 Rheumatic tricuspid insufficiency: Secondary | ICD-10-CM | POA: Diagnosis not present

## 2022-01-29 DIAGNOSIS — R7881 Bacteremia: Secondary | ICD-10-CM | POA: Diagnosis not present

## 2022-01-29 DIAGNOSIS — Z20822 Contact with and (suspected) exposure to covid-19: Secondary | ICD-10-CM | POA: Diagnosis not present

## 2022-01-29 DIAGNOSIS — F17200 Nicotine dependence, unspecified, uncomplicated: Secondary | ICD-10-CM | POA: Diagnosis present

## 2022-01-29 DIAGNOSIS — J45909 Unspecified asthma, uncomplicated: Secondary | ICD-10-CM | POA: Diagnosis not present

## 2022-01-29 DIAGNOSIS — M3214 Glomerular disease in systemic lupus erythematosus: Secondary | ICD-10-CM | POA: Diagnosis not present

## 2022-01-29 DIAGNOSIS — I3139 Other pericardial effusion (noninflammatory): Secondary | ICD-10-CM | POA: Diagnosis not present

## 2022-01-29 DIAGNOSIS — R188 Other ascites: Secondary | ICD-10-CM | POA: Diagnosis present

## 2022-01-29 DIAGNOSIS — A419 Sepsis, unspecified organism: Secondary | ICD-10-CM | POA: Diagnosis not present

## 2022-01-29 DIAGNOSIS — M329 Systemic lupus erythematosus, unspecified: Secondary | ICD-10-CM | POA: Diagnosis not present

## 2022-01-29 DIAGNOSIS — R531 Weakness: Secondary | ICD-10-CM | POA: Diagnosis not present

## 2022-01-29 DIAGNOSIS — I509 Heart failure, unspecified: Secondary | ICD-10-CM | POA: Diagnosis not present

## 2022-01-29 DIAGNOSIS — R110 Nausea: Secondary | ICD-10-CM | POA: Diagnosis not present

## 2022-01-29 DIAGNOSIS — I132 Hypertensive heart and chronic kidney disease with heart failure and with stage 5 chronic kidney disease, or end stage renal disease: Secondary | ICD-10-CM | POA: Diagnosis not present

## 2022-01-29 DIAGNOSIS — F319 Bipolar disorder, unspecified: Secondary | ICD-10-CM | POA: Diagnosis present

## 2022-01-29 DIAGNOSIS — I110 Hypertensive heart disease with heart failure: Secondary | ICD-10-CM | POA: Diagnosis not present

## 2022-01-29 DIAGNOSIS — N250 Renal osteodystrophy: Secondary | ICD-10-CM | POA: Diagnosis not present

## 2022-01-29 DIAGNOSIS — Z86711 Personal history of pulmonary embolism: Secondary | ICD-10-CM

## 2022-01-29 DIAGNOSIS — I10 Essential (primary) hypertension: Secondary | ICD-10-CM | POA: Diagnosis not present

## 2022-01-29 DIAGNOSIS — I129 Hypertensive chronic kidney disease with stage 1 through stage 4 chronic kidney disease, or unspecified chronic kidney disease: Secondary | ICD-10-CM | POA: Diagnosis not present

## 2022-01-29 DIAGNOSIS — Z8619 Personal history of other infectious and parasitic diseases: Secondary | ICD-10-CM

## 2022-01-29 DIAGNOSIS — I340 Nonrheumatic mitral (valve) insufficiency: Secondary | ICD-10-CM | POA: Diagnosis not present

## 2022-01-29 DIAGNOSIS — I120 Hypertensive chronic kidney disease with stage 5 chronic kidney disease or end stage renal disease: Secondary | ICD-10-CM | POA: Diagnosis not present

## 2022-01-29 DIAGNOSIS — E785 Hyperlipidemia, unspecified: Secondary | ICD-10-CM | POA: Diagnosis not present

## 2022-01-29 DIAGNOSIS — Z720 Tobacco use: Secondary | ICD-10-CM | POA: Insufficient documentation

## 2022-01-29 DIAGNOSIS — Z79899 Other long term (current) drug therapy: Secondary | ICD-10-CM

## 2022-01-29 DIAGNOSIS — I517 Cardiomegaly: Secondary | ICD-10-CM | POA: Diagnosis not present

## 2022-01-29 DIAGNOSIS — N186 End stage renal disease: Secondary | ICD-10-CM | POA: Diagnosis not present

## 2022-01-29 DIAGNOSIS — R197 Diarrhea, unspecified: Secondary | ICD-10-CM | POA: Diagnosis present

## 2022-01-29 DIAGNOSIS — I5040 Unspecified combined systolic (congestive) and diastolic (congestive) heart failure: Secondary | ICD-10-CM | POA: Diagnosis not present

## 2022-01-29 DIAGNOSIS — Z833 Family history of diabetes mellitus: Secondary | ICD-10-CM

## 2022-01-29 DIAGNOSIS — B9562 Methicillin resistant Staphylococcus aureus infection as the cause of diseases classified elsewhere: Secondary | ICD-10-CM | POA: Diagnosis present

## 2022-01-29 DIAGNOSIS — I5043 Acute on chronic combined systolic (congestive) and diastolic (congestive) heart failure: Secondary | ICD-10-CM | POA: Diagnosis present

## 2022-01-29 DIAGNOSIS — E8779 Other fluid overload: Secondary | ICD-10-CM | POA: Diagnosis not present

## 2022-01-29 DIAGNOSIS — J9811 Atelectasis: Secondary | ICD-10-CM | POA: Diagnosis not present

## 2022-01-29 DIAGNOSIS — R1084 Generalized abdominal pain: Secondary | ICD-10-CM | POA: Diagnosis not present

## 2022-01-29 DIAGNOSIS — J90 Pleural effusion, not elsewhere classified: Secondary | ICD-10-CM | POA: Diagnosis not present

## 2022-01-29 DIAGNOSIS — I361 Nonrheumatic tricuspid (valve) insufficiency: Secondary | ICD-10-CM | POA: Diagnosis not present

## 2022-01-29 DIAGNOSIS — G4489 Other headache syndrome: Secondary | ICD-10-CM | POA: Diagnosis not present

## 2022-01-29 HISTORY — PX: IR REMOVAL TUN CV CATH W/O FL: IMG2289

## 2022-01-29 LAB — CBC WITH DIFFERENTIAL/PLATELET
Abs Immature Granulocytes: 0.3 10*3/uL — ABNORMAL HIGH (ref 0.00–0.07)
Basophils Absolute: 0 10*3/uL (ref 0.0–0.1)
Basophils Relative: 0 %
Eosinophils Absolute: 0 10*3/uL (ref 0.0–0.5)
Eosinophils Relative: 0 %
HCT: 35.1 % — ABNORMAL LOW (ref 36.0–46.0)
Hemoglobin: 10.9 g/dL — ABNORMAL LOW (ref 12.0–15.0)
Immature Granulocytes: 2 %
Lymphocytes Relative: 3 %
Lymphs Abs: 0.4 10*3/uL — ABNORMAL LOW (ref 0.7–4.0)
MCH: 29.1 pg (ref 26.0–34.0)
MCHC: 31.1 g/dL (ref 30.0–36.0)
MCV: 93.9 fL (ref 80.0–100.0)
Monocytes Absolute: 0.5 10*3/uL (ref 0.1–1.0)
Monocytes Relative: 3 %
Neutro Abs: 14.2 10*3/uL — ABNORMAL HIGH (ref 1.7–7.7)
Neutrophils Relative %: 92 %
Platelets: 176 10*3/uL (ref 150–400)
RBC: 3.74 MIL/uL — ABNORMAL LOW (ref 3.87–5.11)
RDW: 23.2 % — ABNORMAL HIGH (ref 11.5–15.5)
Smear Review: ADEQUATE
WBC: 15.4 10*3/uL — ABNORMAL HIGH (ref 4.0–10.5)
nRBC: 0.6 % — ABNORMAL HIGH (ref 0.0–0.2)

## 2022-01-29 LAB — BLOOD CULTURE ID PANEL (REFLEXED) - BCID2

## 2022-01-29 LAB — COMPREHENSIVE METABOLIC PANEL WITH GFR
ALT: 44 U/L (ref 0–44)
AST: 30 U/L (ref 15–41)
Albumin: 2.8 g/dL — ABNORMAL LOW (ref 3.5–5.0)
Alkaline Phosphatase: 192 U/L — ABNORMAL HIGH (ref 38–126)
Anion gap: 18 — ABNORMAL HIGH (ref 5–15)
BUN: 85 mg/dL — ABNORMAL HIGH (ref 6–20)
CO2: 22 mmol/L (ref 22–32)
Calcium: 8.6 mg/dL — ABNORMAL LOW (ref 8.9–10.3)
Chloride: 96 mmol/L — ABNORMAL LOW (ref 98–111)
Creatinine, Ser: 9.33 mg/dL — ABNORMAL HIGH (ref 0.44–1.00)
GFR, Estimated: 5 mL/min — ABNORMAL LOW (ref >60)
Glucose, Bld: 93 mg/dL (ref 70–99)
Potassium: 5.9 mmol/L — ABNORMAL HIGH (ref 3.5–5.1)
Sodium: 136 mmol/L (ref 135–145)
Total Bilirubin: 0.9 mg/dL (ref 0.3–1.2)
Total Protein: 6.2 g/dL — ABNORMAL LOW (ref 6.5–8.1)

## 2022-01-29 LAB — I-STAT BETA HCG BLOOD, ED (MC, WL, AP ONLY): I-stat hCG, quantitative: 47.1 m[IU]/mL — ABNORMAL HIGH (ref <5)

## 2022-01-29 LAB — LACTIC ACID, PLASMA
Lactic Acid, Venous: 3 mmol/L (ref 0.5–1.9)
Lactic Acid, Venous: 3.1 mmol/L (ref 0.5–1.9)

## 2022-01-29 LAB — HCG, QUANTITATIVE, PREGNANCY: hCG, Beta Chain, Quant, S: 4 m[IU]/mL (ref <5)

## 2022-01-29 LAB — PROTIME-INR
INR: 2.1 — ABNORMAL HIGH (ref 0.8–1.2)
Prothrombin Time: 23.5 seconds — ABNORMAL HIGH (ref 11.4–15.2)

## 2022-01-29 LAB — RESP PANEL BY RT-PCR (FLU A&B, COVID) ARPGX2
Influenza A by PCR: NEGATIVE
Influenza B by PCR: NEGATIVE
SARS Coronavirus 2 by RT PCR: NEGATIVE

## 2022-01-29 LAB — APTT: aPTT: 32 s (ref 24–36)

## 2022-01-29 MED ORDER — NICOTINE 14 MG/24HR TD PT24
14.0000 mg | MEDICATED_PATCH | Freq: Every day | TRANSDERMAL | Status: DC
  Administered 2022-02-08: 14 mg via TRANSDERMAL
  Filled 2022-01-29 (×8): qty 1

## 2022-01-29 MED ORDER — HYDROXYCHLOROQUINE SULFATE 200 MG PO TABS
100.0000 mg | ORAL_TABLET | ORAL | Status: DC
  Administered 2022-01-30 – 2022-02-07 (×5): 100 mg via ORAL
  Filled 2022-01-29 (×6): qty 1

## 2022-01-29 MED ORDER — ALBUTEROL SULFATE (2.5 MG/3ML) 0.083% IN NEBU: 2.5000 mg | INHALATION_SOLUTION | Freq: Four times a day (QID) | RESPIRATORY_TRACT | Status: DC | PRN

## 2022-01-29 MED ORDER — LIDOCAINE-PRILOCAINE 2.5-2.5 % EX CREA
1.0000 "application " | TOPICAL_CREAM | CUTANEOUS | Status: DC | PRN
  Filled 2022-01-29: qty 5

## 2022-01-29 MED ORDER — CARVEDILOL 12.5 MG PO TABS
12.5000 mg | ORAL_TABLET | Freq: Two times a day (BID) | ORAL | Status: DC
Start: 2022-01-29 — End: 2022-02-03
  Administered 2022-01-30 – 2022-02-03 (×9): 12.5 mg via ORAL
  Filled 2022-01-29 (×10): qty 1

## 2022-01-29 MED ORDER — CEFEPIME HCL 1 G IJ SOLR
1.0000 g | INTRAMUSCULAR | Status: DC
  Filled 2022-01-29: qty 1

## 2022-01-29 MED ORDER — ONDANSETRON HCL 4 MG PO TABS: 4.0000 mg | ORAL_TABLET | Freq: Four times a day (QID) | ORAL | Status: DC | PRN

## 2022-01-29 MED ORDER — HYDROMORPHONE HCL 1 MG/ML IJ SOLN
0.5000 mg | INTRAMUSCULAR | Status: DC | PRN
  Administered 2022-01-29 – 2022-02-08 (×48): 0.5 mg via INTRAVENOUS
  Filled 2022-01-29 (×18): qty 0.5
  Filled 2022-01-29: qty 1
  Filled 2022-01-29 (×30): qty 0.5

## 2022-01-29 MED ORDER — SODIUM CHLORIDE 0.9 % IV SOLN
2.0000 g | Freq: Once | INTRAVENOUS | Status: AC
  Administered 2022-01-29: 2 g via INTRAVENOUS
  Filled 2022-01-29: qty 2

## 2022-01-29 MED ORDER — CLONIDINE HCL 0.2 MG PO TABS
0.3000 mg | ORAL_TABLET | Freq: Three times a day (TID) | ORAL | Status: DC
  Administered 2022-01-29 – 2022-02-08 (×28): 0.3 mg via ORAL
  Filled 2022-01-29 (×28): qty 1

## 2022-01-29 MED ORDER — PENTAFLUOROPROP-TETRAFLUOROETH EX AERO
1.0000 "application " | INHALATION_SPRAY | CUTANEOUS | Status: DC | PRN
  Filled 2022-01-29: qty 116

## 2022-01-29 MED ORDER — ACETAMINOPHEN 325 MG PO TABS
650.0000 mg | ORAL_TABLET | Freq: Four times a day (QID) | ORAL | Status: DC | PRN
  Administered 2022-01-29 – 2022-02-07 (×5): 650 mg via ORAL
  Filled 2022-01-29 (×5): qty 2

## 2022-01-29 MED ORDER — LIDOCAINE HCL 1 % IJ SOLN
INTRAMUSCULAR | Status: AC
  Filled 2022-01-29: qty 20

## 2022-01-29 MED ORDER — SODIUM CHLORIDE 0.9 % IV SOLN: 100.0000 mL | INTRAVENOUS | Status: DC | PRN

## 2022-01-29 MED ORDER — ACETAMINOPHEN 500 MG PO TABS
ORAL_TABLET | ORAL | Status: AC
  Filled 2022-01-29: qty 1

## 2022-01-29 MED ORDER — HEPARIN SODIUM (PORCINE) 1000 UNIT/ML DIALYSIS
1000.0000 [IU] | INTRAMUSCULAR | Status: DC | PRN
  Filled 2022-01-29 (×2): qty 1

## 2022-01-29 MED ORDER — SODIUM CHLORIDE 0.9% FLUSH
3.0000 mL | Freq: Two times a day (BID) | INTRAVENOUS | Status: DC
  Administered 2022-01-29 – 2022-02-08 (×20): 3 mL via INTRAVENOUS

## 2022-01-29 MED ORDER — HYDROMORPHONE HCL 1 MG/ML IJ SOLN
0.5000 mg | Freq: Once | INTRAMUSCULAR | Status: AC
  Administered 2022-01-29: 0.5 mg via INTRAVENOUS
  Filled 2022-01-29: qty 1

## 2022-01-29 MED ORDER — APIXABAN 5 MG PO TABS
5.0000 mg | ORAL_TABLET | Freq: Two times a day (BID) | ORAL | Status: DC
  Administered 2022-01-29 – 2022-02-08 (×20): 5 mg via ORAL
  Filled 2022-01-29 (×20): qty 1

## 2022-01-29 MED ORDER — METHOCARBAMOL 500 MG PO TABS
500.0000 mg | ORAL_TABLET | Freq: Four times a day (QID) | ORAL | Status: DC | PRN
  Administered 2022-01-30 – 2022-02-07 (×5): 500 mg via ORAL
  Filled 2022-01-29 (×5): qty 1

## 2022-01-29 MED ORDER — ACETAMINOPHEN 650 MG RE SUPP: 650.0000 mg | Freq: Four times a day (QID) | RECTAL | Status: DC | PRN

## 2022-01-29 MED ORDER — ALTEPLASE 2 MG IJ SOLR: 2.0000 mg | Freq: Once | INTRAMUSCULAR | Status: DC | PRN

## 2022-01-29 MED ORDER — VANCOMYCIN HCL 1.25 G IV SOLR
1250.0000 mg | Freq: Once | INTRAVENOUS | Status: AC
  Administered 2022-01-29: 1250 mg via INTRAVENOUS
  Filled 2022-01-29: qty 25

## 2022-01-29 MED ORDER — VANCOMYCIN HCL 1000 MG IV SOLR
750.0000 mg | INTRAVENOUS | Status: DC
  Filled 2022-01-29: qty 15

## 2022-01-29 MED ORDER — OXYCODONE HCL 5 MG PO TABS
5.0000 mg | ORAL_TABLET | Freq: Four times a day (QID) | ORAL | Status: DC | PRN
  Administered 2022-01-29 – 2022-02-02 (×5): 5 mg via ORAL
  Filled 2022-01-29 (×5): qty 1

## 2022-01-29 MED ORDER — AMLODIPINE BESYLATE 10 MG PO TABS
10.0000 mg | ORAL_TABLET | Freq: Every day | ORAL | Status: DC
  Administered 2022-01-29 – 2022-02-08 (×11): 10 mg via ORAL
  Filled 2022-01-29 (×11): qty 1

## 2022-01-29 MED ORDER — HYDRALAZINE HCL 50 MG PO TABS
100.0000 mg | ORAL_TABLET | Freq: Three times a day (TID) | ORAL | Status: DC
  Administered 2022-01-29 – 2022-02-02 (×13): 100 mg via ORAL
  Filled 2022-01-29 (×13): qty 2

## 2022-01-29 MED ORDER — SODIUM CHLORIDE 0.9 % IV BOLUS (SEPSIS)
1000.0000 mL | Freq: Once | INTRAVENOUS | Status: AC
  Administered 2022-01-29: 1000 mL via INTRAVENOUS

## 2022-01-29 MED ORDER — SORBITOL 70 % SOLN
30.0000 mL | Status: DC | PRN
  Filled 2022-01-29: qty 30

## 2022-01-29 MED ORDER — HYDROXYZINE HCL 25 MG PO TABS
25.0000 mg | ORAL_TABLET | Freq: Three times a day (TID) | ORAL | Status: DC | PRN
  Administered 2022-01-31 – 2022-02-04 (×5): 25 mg via ORAL
  Filled 2022-01-29 (×5): qty 1

## 2022-01-29 MED ORDER — NEPRO/CARBSTEADY PO LIQD
237.0000 mL | Freq: Three times a day (TID) | ORAL | Status: DC | PRN
  Filled 2022-01-29: qty 237

## 2022-01-29 MED ORDER — CALCIUM ACETATE (PHOS BINDER) 667 MG PO CAPS
1334.0000 mg | ORAL_CAPSULE | Freq: Three times a day (TID) | ORAL | Status: DC
  Administered 2022-01-29 – 2022-02-08 (×24): 1334 mg via ORAL
  Filled 2022-01-29 (×25): qty 2

## 2022-01-29 MED ORDER — ONDANSETRON HCL 4 MG/2ML IJ SOLN
4.0000 mg | Freq: Four times a day (QID) | INTRAMUSCULAR | Status: DC | PRN
  Administered 2022-01-29 – 2022-01-30 (×3): 4 mg via INTRAVENOUS
  Filled 2022-01-29 (×3): qty 2

## 2022-01-29 MED ORDER — ONDANSETRON HCL 4 MG/2ML IJ SOLN
4.0000 mg | Freq: Once | INTRAMUSCULAR | Status: AC
Start: 2022-01-29 — End: 2022-01-29
  Administered 2022-01-29: 4 mg via INTRAVENOUS
  Filled 2022-01-29: qty 2

## 2022-01-29 MED ORDER — COLCHICINE 0.6 MG PO TABS
0.3000 mg | ORAL_TABLET | Freq: Every day | ORAL | Status: DC
  Administered 2022-01-30 – 2022-02-08 (×9): 0.3 mg via ORAL
  Filled 2022-01-29 (×11): qty 0.5

## 2022-01-29 MED ORDER — METRONIDAZOLE 500 MG/100ML IV SOLN
500.0000 mg | Freq: Once | INTRAVENOUS | Status: AC
  Administered 2022-01-29: 500 mg via INTRAVENOUS
  Filled 2022-01-29: qty 100

## 2022-01-29 MED ORDER — CHLORHEXIDINE GLUCONATE CLOTH 2 % EX PADS
6.0000 | MEDICATED_PAD | Freq: Every day | CUTANEOUS | Status: DC
  Administered 2022-01-30 – 2022-02-08 (×9): 6 via TOPICAL

## 2022-01-29 MED ORDER — SODIUM ZIRCONIUM CYCLOSILICATE 10 G PO PACK
10.0000 g | PACK | Freq: Every day | ORAL | Status: DC
  Administered 2022-01-30 – 2022-02-07 (×8): 10 g via ORAL
  Filled 2022-01-29 (×10): qty 1

## 2022-01-29 MED ORDER — ACETAMINOPHEN 500 MG PO TABS
1000.0000 mg | ORAL_TABLET | Freq: Once | ORAL | Status: AC
  Administered 2022-01-29: 1000 mg via ORAL
  Filled 2022-01-29: qty 2

## 2022-01-29 MED ORDER — PREDNISONE 20 MG PO TABS
40.0000 mg | ORAL_TABLET | Freq: Every day | ORAL | Status: DC
  Administered 2022-01-30 – 2022-02-08 (×10): 40 mg via ORAL
  Filled 2022-01-29 (×10): qty 2

## 2022-01-29 MED ORDER — LIDOCAINE HCL (PF) 1 % IJ SOLN: 5.0000 mL | INTRAMUSCULAR | Status: DC | PRN

## 2022-01-29 MED ORDER — DOCUSATE SODIUM 283 MG RE ENEM
1.0000 | ENEMA | RECTAL | Status: DC | PRN
  Filled 2022-01-29: qty 1

## 2022-01-29 MED ORDER — ZOLPIDEM TARTRATE 5 MG PO TABS
5.0000 mg | ORAL_TABLET | Freq: Every evening | ORAL | Status: DC | PRN
  Administered 2022-02-01: 5 mg via ORAL
  Filled 2022-01-29: qty 1

## 2022-01-29 MED ORDER — VANCOMYCIN HCL IN DEXTROSE 1-5 GM/200ML-% IV SOLN: 1000.0000 mg | Freq: Once | INTRAVENOUS | Status: DC

## 2022-01-29 MED ORDER — SODIUM CHLORIDE 0.9 % IV SOLN: INTRAVENOUS | Status: DC

## 2022-01-29 MED ORDER — CALCIUM CARBONATE ANTACID 1250 MG/5ML PO SUSP
500.0000 mg | Freq: Four times a day (QID) | ORAL | Status: DC | PRN
  Filled 2022-01-29: qty 5

## 2022-01-29 NOTE — Assessment & Plan Note (Addendum)
-  Continue Clonidine, Carvedilol, Amlodipine, hydralazine ?

## 2022-01-29 NOTE — Assessment & Plan Note (Signed)
-   Continue Eliquis 

## 2022-01-29 NOTE — Assessment & Plan Note (Signed)
-  SIRS criteria in this patient includes: Leukocytosis, fever, tachycardia, tachypnea  ?-Patient has evidence of acute organ failure with elevated lactate >2 that is not easily explained by another condition. ?-Blood cultures appear to be positive for MRSA ?-Sepsis protocol initiated ?-Suspected source is dialysis catheter, which has since been removed ?-Blood and urine cultures pending ?-Will admit due to:  Bacteremia ?-Treat with IV Vanc ?-Will trend lactate to ensure improvement ?-Will consult ID ?-Echo ordered; if negative will need TEE ?

## 2022-01-29 NOTE — Assessment & Plan Note (Signed)
-  Encourage cessation.   -Patch ordered  

## 2022-01-29 NOTE — Consult Note (Signed)
Reason for Consult: To manage dialysis and dialysis related needs Referring Physician: Dr Cathy [name]  Cathy [name] is an 41 y.o. female.  HPI: Pt is a 59F with a PMH sig for HTN, HLD, lupus nephritis, ESRD on HD who is now seen in consultation at the request of DR. Mansy for eval and recs re: provision of HD and management of ESRD.    Pt has had several recent complicated hospitalizations- all related to chronic underdialysis in some way.  1/13-1/17--> RLL pneumonia, large parapneumonic effusion, s/p chest tube and cefepime  1/26-1/30--> RML pneumonia, pericarditis/ effusion (? Lupus vs uremic), and lupus flare 2/5/-2/9--> hypertensive urgency, PE, EF down to 20-25%.  Now admitted 01/29/22 with fevers/ chills.  Has a history of MSSA bacteremia 05/2021. Missed HD yesterday.  States that her [facility] is tender and has been for the last 2 days.  Has headache and nausea. In this setting we are asked to see.     Dialyzes at [facility] TTS 4 hrs EDW 52 kg 2K/ 2 Ca bath BFR 400 via TDC, DFR 800 Hectorol 3 q rx Venofer 100 q rx x 10, ending 02/08/22 Mircera 225 q 2 weeks, last given 01/25/22   Past Medical History:  Diagnosis Date   Anasarca associated with disorder of kidney 06/08/2021   Anxiety    Asthma    Bipolar depression ([place])    Depression    Dyspnea    ESRD (end stage renal disease) on dialysis ([place]) 10/2020   GERD (gastroesophageal reflux disease)    Gonorrhea    Lupus (H[facility])    Pelvic inflammatory disease (PID)    Pleural effusion 06/08/2021   Renal hypertension    Trichomonas infection     Past Surgical History:  Procedure Laterality Date   IR FLUORO GUIDE CV LINE RIGHT  11/30/2020   IR FLUORO GUIDE CV LINE RIGHT  06/13/2021   IR REMOVAL TUN CV CATH W/O FL  06/11/2021   IR US GUIDE VASC ACCESS RIGHT  11/30/2020   IR US GUIDE VASC ACCESS RIGHT  06/13/2021   RENAL BIOPSY     TEE WITHOUT CARDIOVERSION N/A 06/13/2021   Procedure: TRANSESOPHAGEAL ECHOCARDIOGRAM (TEE);  Surgeon: Josue Hector, MD;  Location: [facility];  Service: Cardiovascular;  Laterality: N/A;   TUBAL LIGATION N/A 02/03/2019   Procedure: POST PARTUM TUBAL LIGATION;  Surgeon: Aletha Halim, MD;  Location: [facility] LD [facility]S;  Service: Gynecology;  Laterality: N/A;    Family History  Problem Relation Age of Onset   Diabetes Maternal Grandmother    Hypertension Maternal Grandmother    Diabetes Maternal Grandfather    Hypertension Maternal Grandfather    Diabetes Paternal Grandmother    Hypertension Paternal Grandmother    Diabetes Paternal Grandfather    Hypertension Paternal Grandfather     Social History:  reports that she has been smoking cigarettes. She has been smoking an average of .25 packs per day. She has never used smokeless tobacco. She reports that she does not currently use alcohol. She reports that she does not currently use drugs after having used the following drugs: Marijuana.  Allergies: No Known Allergies  Medications: Scheduled:  amLODipine  10 mg Oral Daily   apixaban  5 mg Oral BID   calcium acetate  1,334 mg Oral TID WC   carvedilol  12.5 mg Oral BID WC   Chlorhexidine Gluconate Cloth  6 each Topical Q0600   cloNIDine  0.3 mg Oral TID   colchicine  0.3 mg Oral  Daily   hydrALAZINE  100 mg Oral TID   hydroxychloroquine  100 mg Oral Q48H   nicotine  14 mg Transdermal Daily   [START ON 01/30/2022] predniSONE  40 mg Oral Q breakfast   sodium chloride flush  3 mL Intravenous Q12H   sodium zirconium cyclosilicate  10 g Oral Daily     Results for orders placed or performed during the hospital encounter of 01/29/22 (from the past 48 hour(s))  Resp Panel by RT-PCR (Flu A&B, Covid) Nasopharyngeal Swab     Status: None   Collection Time: 01/29/22  2:18 AM   Specimen: Nasopharyngeal Swab; Nasopharyngeal(NP) swabs in vial transport medium  Result Value Ref Range   SARS Coronavirus 2 by RT PCR NEGATIVE NEGATIVE    Comment: (NOTE) SARS-CoV-2 target nucleic acids are NOT DETECTED.  The  SARS-CoV-2 RNA is generally detectable in upper respiratory specimens during the acute phase of infection. The lowest concentration of SARS-CoV-2 viral copies this assay can detect is 138 copies/mL. A negative result does not preclude SARS-Cov-2 infection and should not be used as the sole basis for treatment or other patient management decisions. A negative result may occur with  improper specimen collection/handling, submission of specimen other than nasopharyngeal swab, presence of viral mutation(s) within the areas targeted by this assay, and inadequate number of viral copies(<138 copies/mL). A negative result must be combined with clinical observations, patient history, and epidemiological information. The expected result is Negative.  Fact Sheet for Patients:  EntrepreneurPulse.com.au  Fact Sheet for Healthcare Providers:  IncredibleEmployment.be  This test is no t yet approved or cleared by the [place] FDA and  has been authorized for detection and/or diagnosis of SARS-CoV-2 by FDA under an Emergency Use Authorization (EUA). This EUA will remain  in effect (meaning this test can be used) for the duration of the COVID-19 declaration under Section 564(b)(1) of the Act, 21 U.S.C.section 360bbb-3(b)(1), unless the authorization is terminated  or revoked sooner.       Influenza A by PCR NEGATIVE NEGATIVE   Influenza B by PCR NEGATIVE NEGATIVE    Comment: (NOTE) The Xpert Xpress SARS-CoV-2/FLU/RSV plus assay is intended as an aid in the diagnosis of influenza from Nasopharyngeal swab specimens and should not be used as a sole basis for treatment. Nasal washings and aspirates are unacceptable for Xpert Xpress SARS-CoV-2/FLU/RSV testing.  Fact Sheet for Patients: EntrepreneurPulse.com.au  Fact Sheet for Healthcare Providers: IncredibleEmployment.be  This test is not yet approved or cleared by the  [place] FDA and has been authorized for detection and/or diagnosis of SARS-CoV-2 by FDA under an Emergency Use Authorization (EUA). This EUA will remain in effect (meaning this test can be used) for the duration of the COVID-19 declaration under Section 564(b)(1) of the Act, 21 U.S.C. section 360bbb-3(b)(1), unless the authorization is terminated or revoked.  Performed at [name], [place] [address], [place], [place] [address]   Blood Culture (routine x 2)     Status: None (Preliminary result)   Collection Time: 01/29/22  2:30 AM   Specimen: BLOOD LEFT ARM  Result Value Ref Range   Specimen Description BLOOD LEFT ARM    Special Requests      BOTTLES DRAWN AEROBIC AND ANAEROBIC Blood Culture results may not be optimal due to an excessive volume of blood received in culture bottles   Culture  Setup Time      GRAM POSITIVE COCCI IN CLUSTERS IN BOTH AEROBIC AND ANAEROBIC BOTTLES Organism ID to follow  Performed at [facility], [place] [address], [place], [place] [address]    Culture PENDING    Report Status PENDING   Blood Culture (routine x 2)     Status: None (Preliminary result)   Collection Time: 01/29/22  2:40 AM   Specimen: BLOOD LEFT HAND  Result Value Ref Range   Specimen Description BLOOD LEFT HAND    Special Requests      BOTTLES DRAWN AEROBIC ONLY Blood Culture results may not be optimal due to an excessive volume of blood received in culture bottles   Culture      NO GROWTH < 12 HOURS Performed at [place] [address], [place], [place] [address]    Report Status PENDING   Lactic acid, plasma     Status: Abnormal   Collection Time: 01/29/22  2:46 AM  Result Value Ref Range   Lactic Acid, Venous 3.0 (HH) 0.5 - 1.9 mmol/L    Comment: CRITICAL RESULT CALLED TO, READ BACK BY AND VERIFIED WITH: MUNNETT [facility] 01/29/22 0345 WAYK Performed at [facility], [place] [address], [place], [place] [address]   Comprehensive metabolic panel      Status: Abnormal   Collection Time: 01/29/22  2:46 AM  Result Value Ref Range   Sodium 136 135 - 145 mmol/L   Potassium 5.9 (H) 3.5 - 5.1 mmol/L   Chloride 96 (L) 98 - 111 mmol/L   CO2 22 22 - 32 mmol/L   Glucose, Bld 93 70 - 99 mg/dL    Comment: Glucose reference range applies only to samples taken after fasting for at least 8 hours.   BUN 85 (H) 6 - 20 mg/dL   Creatinine, Ser 9.33 (H) 0.44 - 1.00 mg/dL   Calcium 8.6 (L) 8.9 - 10.3 mg/dL   Total Protein 6.2 (L) 6.5 - 8.1 g/dL   Albumin 2.8 (L) 3.5 - 5.0 g/dL   AST 30 15 - 41 U/L   ALT 44 0 - 44 U/L   Alkaline Phosphatase 192 (H) 38 - 126 U/L   Total Bilirubin 0.9 0.3 - 1.2 mg/dL   GFR, Estimated 5 (L) >60 mL/min    Comment: (NOTE) Calculated using the CKD-EPI Creatinine Equation (2021)    Anion gap 18 (H) 5 - 15    Comment: Performed at [facility], [place] [address] [place],  [address]  CBC WITH DIFFERENTIAL     Status: Abnormal   Collection Time: 01/29/22  2:46 AM  Result Value Ref Range   WBC 15.4 (H) 4.0 - 10.5 K/uL   RBC 3.74 (L) 3.87 - 5.11 MIL/uL   Hemoglobin 10.9 (L) 12.0 - 15.0 g/dL   HCT 35.1 (L) 36.0 - 46.0 %   MCV 93.9 80.0 - 100.0 fL   MCH 29.1 26.0 - 34.0 pg   MCHC 31.1 30.0 - 36.0 g/dL   RDW 23.2 (H) 11.5 - 15.5 %   Platelets 176 150 - 400 K/uL   nRBC 0.6 (H) 0.0 - 0.2 %   Neutrophils Relative % 92 %   Neutro Abs 14.2 (H) 1.7 - 7.7 K/uL   Lymphocytes Relative 3 %   Lymphs Abs 0.4 (L) 0.7 - 4.0 K/uL   Monocytes Relative 3 %   Monocytes Absolute 0.5 0.1 - 1.0 K/uL   Eosinophils Relative 0 %   Eosinophils Absolute 0.0 0.0 - 0.5 K/uL   Basophils Relative 0 %   Basophils Absolute 0.0 0.0 - 0.1 K/uL  WBC Morphology DOHLE BODIES    Smear Review PLATELETS APPEAR ADEQUATE    Immature Granulocytes 2 %   Abs Immature Granulocytes 0.30 (H) 0.00 - 0.07 K/uL   Tear Drop Cells PRESENT    Polychromasia PRESENT     Comment: Performed at [facility], [place] [address] [place], [place] [address]   Protime-INR     Status: Abnormal   Collection Time: 01/29/22  2:46 AM  Result Value Ref Range   Prothrombin Time 23.5 (H) 11.4 - 15.2 seconds   INR 2.1 (H) 0.8 - 1.2    Comment: (NOTE) INR goal varies based on device and disease states. Performed at [facility], [place] [address], [place], [place] [address]   APTT     Status: None   Collection Time: 01/29/22  2:46 AM  Result Value Ref Range   aPTT 32 24 - 36 seconds    Comment: Performed at [place] [address] [place], [place] [address]  hCG, quantitative, pregnancy     Status: None   Collection Time: 01/29/22  2:46 AM  Result Value Ref Range   hCG, Beta Chain, Quant, S 4 <5 mIU/mL    Comment:          GEST. AGE      CONC.  (mIU/mL)   <=1 WEEK        5 - 50     2 WEEKS       50 - 500     3 WEEKS       100 - 10,000     4 WEEKS     1,000 - 30,000     5 WEEKS     3,500 - 115,000   6-8 WEEKS     12,000 - 270,000    12 WEEKS     15,000 - 220,000        FEMALE AND NON-PREGNANT FEMALE:     LESS THAN 5 mIU/mL Performed at [facility], [place] [address], [place], [place] [address]   I-Stat beta hCG blood, ED     Status: Abnormal   Collection Time: 01/29/22  5:21 AM  Result Value Ref Range   I-stat hCG, quantitative 47.1 (H) <5 mIU/mL   Comment 3            Comment:   GEST. AGE      CONC.  (mIU/mL)   <=1 WEEK        5 - 50     2 WEEKS       50 - 500     3 WEEKS       100 - 10,000     4 WEEKS     1,000 - 30,000        FEMALE AND NON-PREGNANT FEMALE:     LESS THAN 5 mIU/mL   Culture, blood (single)     Status: None (Preliminary result)   Collection Time: 01/29/22  5:56 AM   Specimen: BLOOD  Result Value Ref Range   Specimen Description BLOOD CENTRAL LINE    Special Requests      BOTTLES DRAWN AEROBIC AND ANAEROBIC Blood Culture adequate volume   Culture      NO GROWTH < 12 HOURS Performed at [facility], [place] [address], [place], [place] [address]    Report Status PENDING   Lactic acid,  plasma     Status: Abnormal   Collection Time: 01/29/22  7:48  AM  Result Value Ref Range   Lactic Acid, Venous 3.1 (HH) 0.5 - 1.9 mmol/L    Comment: CRITICAL VALUE NOTED.  VALUE IS CONSISTENT WITH PREVIOUSLY REPORTED AND CALLED VALUE. Performed at [facility], [place] [address] [place], [place] [address]     DG Chest 2 View  Result Date: 01/29/2022 CLINICAL DATA:  Possible sepsis. EXAM: CHEST - 2 VIEW COMPARISON:  Chest radiograph dated 01/05/2022. FINDINGS: Dialysis catheter in similar position. Small bilateral pleural effusions with bibasilar atelectasis. No pneumothorax. Stable cardiomegaly. No acute osseous pathology. IMPRESSION: 1. Small bilateral pleural effusions with bibasilar atelectasis. 2. Cardiomegaly. Electronically Signed   By: Anner Crete M.D.   On: 01/29/2022 03:04    ROS: all other systems reviewed and are negative except as per HPI  Blood pressure 127/89, pulse 82, temperature 98.2 F (36.8 C), temperature source Temporal, resp. rate 20, height 5\' 7"  (1.702 m), weight 63.5 kg, SpO2 99 %, unknown if currently breastfeeding. GEN ill-appearing, having chilld HEENT EOMI PERRL NECK no JVD PULM coarse CV RRR ABD+ protuberant, +fluid wave EXT +++ LE edema NEURO AAO x 3 ACCESS:  TDC tunnel tender and erythematous  Assessment/Plan: 1 Sepsis- suspect TDC as culprit. Will need line holiday after HD.  Will ask IR to remove.  Cultures drawn already, got vanc/ cefepime/ flagyl 2 ESRD: TTS- has chronic underdialysis.   3 Hypertension/volume- severely volume overloaded- 20 kg over by OP EDW.  Max UF as we can 4. 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 Metabolic Bone Disease: hectorol and binders 6.  Lupus; on pred and plaquenil 7.  Dispo: inpatient.  Madelon Lips 01/29/2022, 1:09 PM

## 2022-01-29 NOTE — ED Notes (Signed)
0900 and 1000 medications were not verified prior to dialysis.  ?

## 2022-01-29 NOTE — ED Triage Notes (Signed)
Pt presents to the ED from home via [facility] with complaints of headache, SOB, chills, nausea and diarrhea onset yesterday. HD pt Tu/Th/Sat, did not go today. Found to have fever in triage. Pt states she has been on thinners for 2 weeks and thinks that is why she feels bad.  ?

## 2022-01-29 NOTE — H&P (Signed)
History and Physical    Patient: Cathy [name] PPI:[id number] DOB: [birth year] DOA: 01/29/2022 DOS: the patient was seen and examined on 01/29/2022 PCP: Pcp, No  Patient coming from: Home - lives with 2 sons and a roommate; NOK: Mother, Cathy [name], [phone]   Chief Complaint: Fever  HPI: Cathy [name] is a 41 y.o. female with medical history significant of bipolar d/o; lupus; chronic combined CHF; MSSA bacteremia (05/2021); HTN; and ESRD on TTS HD presenting with fever.  She was previously admitted from 2/5-9 for malignant HTN and acute PE.  She has had 2 other recent admissions with moderate to large pericardial effusion and PNA.  She was doing really well until yesterday,  She started feeling nauseated, then headache, chest pain, breathing difficulty, abdominal pain.  She was doing well with going to HD for the last 2 weeks but she couldn't get out of bed yesterday, missed yesterday. She has had fever to 103.  Mild cough, it hurts when she coughs.  She makes a little urine, has mild lower abdominal pain without urinary symptoms.      ER Course:  Carryover, per Dr. Sidney Ace:  41 year old female with H/O SLE, end-stage renal disease on hemodialysis Tuesday Thursday Saturday, missing Tuesday, history of noncompliance, history of pericarditis, history of combined systolic/diastolic heart failure, coming with fatigue, pain all over and fever with abdominal discomfort.  She has pericardial pleural effusion.  She had recent PE and was placed on Eliquis as well as recent pericarditis.  Port-A-Cath site was erythematous and could be the culprit for her sepsis.  She had MSSA bacteremia in July 2022.  Med telemetry bed order was placed.     Review of Systems: As mentioned in the history of present illness. All other systems reviewed and are negative. Past Medical History:  Diagnosis Date   Anasarca associated with disorder of kidney 06/08/2021   Anxiety    Asthma    Bipolar depression ([place])     Depression    Dyspnea    ESRD (end stage renal disease) on dialysis ([place]) 10/2020   GERD (gastroesophageal reflux disease)    Gonorrhea    Lupus ([facility])    Pelvic inflammatory disease (PID)    Pleural effusion 06/08/2021   Renal hypertension    Trichomonas infection    Past Surgical History:  Procedure Laterality Date   IR FLUORO GUIDE CV LINE RIGHT  11/30/2020   IR FLUORO GUIDE CV LINE RIGHT  06/13/2021   IR REMOVAL TUN CV CATH W/O FL  06/11/2021   IR US GUIDE VASC ACCESS RIGHT  11/30/2020   IR US GUIDE VASC ACCESS RIGHT  06/13/2021   RENAL BIOPSY     TEE WITHOUT CARDIOVERSION N/A 06/13/2021   Procedure: TRANSESOPHAGEAL ECHOCARDIOGRAM (TEE);  Surgeon: Josue Hector, MD;  Location: [facility];  Service: Cardiovascular;  Laterality: N/A;   TUBAL LIGATION N/A 02/03/2019   Procedure: POST PARTUM TUBAL LIGATION;  Surgeon: Aletha Halim, MD;  Location: [facility];  Service: Gynecology;  Laterality: N/A;   Social History:  reports that she has been smoking cigarettes. She has been smoking an average of .25 packs per day. She has never used smokeless tobacco. She reports that she does not currently use alcohol. She reports that she does not currently use drugs after having used the following drugs: Marijuana.  No Known Allergies  Family History  Problem Relation Age of Onset   Diabetes Maternal Grandmother    Hypertension Maternal Grandmother    Diabetes  Maternal Grandfather    Hypertension Maternal Grandfather    Diabetes Paternal Grandmother    Hypertension Paternal Grandmother    Diabetes Paternal Grandfather    Hypertension Paternal Grandfather     Prior to Admission medications   Medication Sig Start Date End Date Taking? Authorizing Provider  acetaminophen (TYLENOL) 500 MG tablet Take 1,000 mg by mouth every 6 (six) hours as needed for moderate pain.    [provider]  albuterol (VENTOLIN HFA) 108 (90 Base) MCG/ACT inhaler Inhale 2 puffs into the lungs every 6 (six)  hours as needed for wheezing or shortness of breath. 12/17/21 12/17/22  Rai, Ripudeep K, MD  amLODipine (NORVASC) 10 MG tablet Take 1 tablet (10 mg total) by mouth daily. 01/09/22   Shelly Coss, MD  APIXABAN Arne [place]) VTE STARTER PACK (10MG  AND 5MG ) Take 10 mg by mouth 2 (two) times daily for 6 days. Then take 5 mg by mouth once a day. 01/09/22 01/15/22  Shelly Coss, MD  apixaban (ELIQUIS) 5 MG TABS tablet Take 1 tablet (5 mg total) by mouth 2 (two) times daily. 01/15/22   Shelly Coss, MD  Blood Pressure Monitoring (OMRON 3 SERIES BP MONITOR) DEVI Use as directed 2 times a day 01/09/22   Shelly Coss, MD  calcium acetate (PHOSLO) 667 MG capsule Take 2 capsules (1,334 mg total) by mouth 3 (three) times daily with meals. 01/09/22   Shelly Coss, MD  carvedilol (COREG) 12.5 MG tablet Take 1 tablet (12.5 mg total) by mouth 2 (two) times daily with a meal. 01/09/22   Shelly Coss, MD  cloNIDine (CATAPRES) 0.3 MG tablet Take 1 tablet (0.3 mg total) by mouth 3 (three) times daily. 01/09/22   Shelly Coss, MD  colchicine 0.6 MG tablet Take 0.5 tablets (0.3 mg total) by mouth daily. 12/31/21   Arrien, Jimmy Picket, MD  guaiFENesin (MUCINEX) 600 MG 12 hr tablet Take 600 mg by mouth 2 (two) times daily as needed for cough.    [provider]  hydrALAZINE (APRESOLINE) 100 MG tablet Take 1 tablet (100 mg total) by mouth 3 (three) times daily. 01/09/22   Shelly Coss, MD  hydroxychloroquine (PLAQUENIL) 200 MG tablet Take 1/2 tablet (100 mg total) by mouth every other day after hemodialysis 01/09/22   Shelly Coss, MD  hydrOXYzine (ATARAX) 25 MG tablet Take 1 tablet (25 mg total) by mouth 3 (three) times daily as needed for itching or anxiety. 12/17/21   Rai, Vernelle Emerald, MD  ipratropium (ATROVENT HFA) 17 MCG/ACT inhaler Inhale 2 puffs into the lungs every 6 (six) hours as needed for wheezing. 12/17/21 12/17/22  Rai, Vernelle Emerald, MD  Menthol-Methyl Salicylate (ICY HOT) 02-63 % STCK Apply 1 application  topically as needed (pain).    [provider]  methocarbamol (ROBAXIN) 500 MG tablet Take 1 tablet (500 mg total) by mouth every 6 (six) hours as needed for muscle spasms. 12/17/21   Rai, Ripudeep K, MD  nicotine (NICODERM CQ - DOSED IN MG/24 HOURS) 21 mg/24hr patch Place 1 patch (21 mg total) onto the skin daily. 01/10/22   Shelly Coss, MD  Nutritional Supplements (,FEEDING SUPPLEMENT, PROSOURCE PLUS) liquid Take 30 mLs by mouth 3 (three) times daily between meals. Patient not taking: Reported on 01/06/2022 06/14/21   Edwin Dada, MD  oxyCODONE (OXY IR/ROXICODONE) 5 MG immediate release tablet Take 1 tablet (5 mg total) by mouth every 6 (six) hours as needed for severe pain. 12/30/21   Arrien, Jimmy Picket, MD  predniSONE ([place]) 20  MG tablet Take 2 tablets (40 mg total) by mouth daily with breakfast. 12/31/21 01/30/22  Arrien, Jimmy Picket, MD  torsemide (DEMADEX) 20 MG tablet Take 1 tablet (20 mg total) by mouth 2 (two) times daily. 01/09/22   Shelly Coss, MD  fenofibrate 160 MG tablet Take 1 tablet (160 mg total) by mouth daily. 12/31/20 01/20/21  Elmarie Shiley, MD    Physical Exam: Vitals:   01/29/22 1542 01/29/22 1550 01/29/22 1714 01/29/22 1745  BP: (!) 160/99 (!) 157/115 (!) 151/107 (!) 146/95  Pulse:   74 81  Resp: (!) 27 18 17  (!) 21  Temp:   99 F (37.2 C)   TempSrc:   Oral   SpO2:   100% 100%  Weight:      Height:       General:  Appears calm and comfortable and is in NAD, mildly ill-appearing Eyes:  PERRL, EOMI, normal lids, iris ENT:  grossly normal hearing, lips & tongue, mmm Neck:  no LAD, masses or thyromegaly Cardiovascular:  RRR, no m/r/g. No LE edema.  Respiratory:   CTA bilaterally with no wheezes/rales/rhonchi.  Normal to mildly increased respiratory effort. Abdomen:  soft, NT, ND Skin:  no rash or induration seen on limited exam Musculoskeletal:  grossly normal tone BUE/BLE, good ROM, no bony abnormality Psychiatric:  blunted mood  and affect, speech fluent and appropriate, AOx3 Neurologic:  CN 2-12 grossly intact, moves all extremities in coordinated fashion   Radiological Exams on Admission: Independently reviewed - see discussion in A/P where applicable  DG Chest 2 View  Result Date: 01/29/2022 CLINICAL DATA:  Possible sepsis. EXAM: CHEST - 2 VIEW COMPARISON:  Chest radiograph dated 01/05/2022. FINDINGS: Dialysis catheter in similar position. Small bilateral pleural effusions with bibasilar atelectasis. No pneumothorax. Stable cardiomegaly. No acute osseous pathology. IMPRESSION: 1. Small bilateral pleural effusions with bibasilar atelectasis. 2. Cardiomegaly. Electronically Signed   By: Anner Crete M.D.   On: 01/29/2022 03:04    EKG: Independently reviewed.  NSR with rate 67; no evidence of acute ischemia   Labs on Admission: I have personally reviewed the available labs and imaging studies at the time of the admission.  Pertinent labs:    K+ 5.9 BUN 85/Creatinine 9.33/GFR 5 Anion gap 18 AP 192 Albumin 2.8 Lactate 3.0 WBC 15.4 Hgb 10.9 INR 2.1 HCG 47.1 COVID/flu negative    Assessment and Plan: * MRSA bacteremia- (present on admission) -SIRS criteria in this patient includes: Leukocytosis, fever, tachycardia, tachypnea  -Patient has evidence of acute organ failure with elevated lactate >2 that is not easily explained by another condition. -Blood cultures appear to be positive for MRSA -Sepsis protocol initiated -Suspected source is dialysis catheter, which has since been removed -Blood and urine cultures pending -Will admit due to:  Bacteremia -Treat with IV Vanc -Will trend lactate to ensure improvement -Will consult ID -Echo ordered; if negative will need TEE  Tobacco dependence- (present on admission) -Encourage cessation.   -Patch ordered  Acute pulmonary embolus ([place])- (present on admission) -Continue Eliquis  Chronic combined systolic and diastolic congestive heart failure  ([place])- (present on admission) -Volume maintained via HD  ESRD on hemodialysis ([place]) -Patient on chronic TTS HD -Nephrology prn order set utilized -She does needs HD and needs improved compliance -Nephrology is consulting -Continue Phoslo  Lupus nephritis ([place])- (present on admission) -Continue Plaquenil, prednisone  Renal hypertension- (present on admission) -Continue Clonidine, Carvedilol, Amlodipine, hydralazine       Advance Care Planning:   Code Status: Full  Code   Consults: ID; Nephrology; IR  DVT Prophylaxis: Eliquis  Family Communication: None present  Severity of Illness: The appropriate patient status for this patient is INPATIENT. Inpatient status is judged to be reasonable and necessary in order to provide the required intensity of service to ensure the patient's safety. The patient's presenting symptoms, physical exam findings, and initial radiographic and laboratory data in the context of their chronic comorbidities is felt to place them at high risk for further clinical deterioration. Furthermore, it is not anticipated that the patient will be medically stable for discharge from the hospital within 2 midnights of admission.   * I certify that at the point of admission it is my clinical judgment that the patient will require inpatient hospital care spanning beyond 2 midnights from the point of admission due to high intensity of service, high risk for further deterioration and high frequency of surveillance required.*  Author: Karmen Bongo, MD 01/29/2022 7:49 PM  For on call review [email or web address].

## 2022-01-29 NOTE — Procedures (Signed)
Patient seen and examined on Hemodialysis. ?BP 127/89   Pulse 82   Temp 98.2 ?F (36.8 ?C) (Temporal)   Resp 20   Ht 5\' 7"  (1.702 m)   Wt 63.5 kg   SpO2 99%   BMI 21.93 kg/m?   ?QB 400 mL/ min via TDC, UF goal 3L ? ?Looks miserable.  TDC tunnel very tender.  No drainage.   ? ?Will need it removed after dialysis today for line holiday. ? ? ?Madelon Lips MD ?[facility] ?Pgr [phone] ?1:41 PM  ?

## 2022-01-29 NOTE — ED Notes (Signed)
Pt given a warm blanket and socks 

## 2022-01-29 NOTE — ED Notes (Signed)
Pt c/o headache and nausea.  PRN medications provided.  ?

## 2022-01-29 NOTE — Consult Note (Signed)
[facility]                                                                                        Patient Identification: Patient Name: Cathy [name] MRN: [id number] [place] Date: 01/29/2022  2:16 AM Today's Date: 01/29/2022 Reason for consult: MRSA bacteremia  Requesting provider: CHAMP autoconsult   Active Problems:   Sepsis ([place])   Antibiotics:  Cefepime and metronidazole 2/28-c  Vancomycin 3/1-c   Lines/Hardware: RT chest HD catheter  Assessment # High-grade MRSA bacteremia likely secondary to right-sided HD catheter, rule out endocarditis # SLE - on plaquenil and prednisone # ESRD on hemodialysis # History of MSSA bacteremia in 05/2021 ( treated )  Recommendations  Continue vancomycin, pharmacy to dose Stop remaining antibiotics Repeat blood cultures on 3/2 TTE.  Needs TEE if TTE is negative for endocarditis Needs line holiday given MRSA bacteremia Monitor for metastatic sites of infection Monitor CBC BMP and vancomycin trough Following   Rest of the management as per the primary team. Please call with questions or concerns.  Thank you for the consult  Rosiland Oz, MD Infectious Disease Physician [facility] [address] 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  TDC site is tender for last 2 days. Missed HD yesterday.   At ED, Tmax 103.1 Labs remarkable for potassium 5.9, ALP 192, albumin 2.8, lactic acid 3, WBC 15.4 Blood cultures 3/1 both sets GPC.  MRSA by [facility] ID Chest x-ray with small bilateral pleural effusion and bibasilar atelectasis Patient is undergoing hemodialysis She is having chills, right HD catheter site is tender, no fluctuance noted, no exit site purulence  ROS: General- fevers and chills + HEENT - Denies headache, blurry vision, neck pain, sinus pain Chest - Denies any chest pain, cough, SOB + CVS- Denies any dizziness/lightheadedness, syncopal attacks, palpitations Abdomen- Denies vomiting,  hematochezia and diarrhea, nausea and abdominal pain + Neuro - Denies any weakness, numbness, tingling sensation Psych - Denies any changes in mood irritability or depressive symptoms GU- Denies any burning, dysuria, hematuria or increased frequency of urination Skin - denies any rashes/lesions MSK - denies any joint pain/swelling or restricted ROM   Past Medical History:  Diagnosis Date   Anasarca associated with disorder of kidney 06/08/2021   Anxiety    Asthma    Bipolar depression (HCC)    Depression    Dyspnea    ESRD (end stage renal disease) on dialysis ([place]) 10/2020   GERD (gastroesophageal reflux disease)    Gonorrhea    Lupus (HCC)    Pelvic inflammatory disease (PID)    Pleural effusion 06/08/2021   Renal hypertension    Trichomonas infection  Past Surgical History:  Procedure Laterality Date   IR FLUORO GUIDE CV LINE RIGHT  11/30/2020   IR FLUORO GUIDE CV LINE RIGHT  06/13/2021   IR REMOVAL TUN CV CATH W/O FL  06/11/2021   IR US GUIDE VASC ACCESS RIGHT  11/30/2020   IR US GUIDE VASC ACCESS RIGHT  06/13/2021   RENAL BIOPSY     TEE WITHOUT CARDIOVERSION N/A 06/13/2021   Procedure: TRANSESOPHAGEAL ECHOCARDIOGRAM (TEE);  Surgeon: Josue Hector, MD;  Location: [facility];  Service: Cardiovascular;  Laterality:  N/A;   TUBAL LIGATION N/A 02/03/2019   Procedure: POST PARTUM TUBAL LIGATION;  Surgeon: Aletha Halim, MD;  Location: [facility];  Service: Gynecology;  Laterality: N/A;    Scheduled Meds:  amLODipine  10 mg Oral Daily   apixaban  5 mg Oral BID   calcium acetate  1,334 mg Oral TID WC   carvedilol  12.5 mg Oral BID WC   Chlorhexidine Gluconate Cloth  6 each Topical Q0600   cloNIDine  0.3 mg Oral TID   colchicine  0.3 mg Oral Daily   hydrALAZINE  100 mg Oral TID   hydroxychloroquine  100 mg Oral Q48H   nicotine  14 mg Transdermal Daily   [START ON 01/30/2022] predniSONE  40 mg Oral Q breakfast   sodium chloride flush  3 mL Intravenous Q12H   sodium zirconium cyclosilicate  10 g Oral Daily   Continuous Infusions:  sodium chloride     sodium chloride     [START ON 01/30/2022] vancomycin     PRN Meds:.sodium chloride, sodium chloride, acetaminophen **OR** acetaminophen, albuterol, alteplase, calcium carbonate (dosed in mg elemental calcium), docusate sodium, feeding supplement (NEPRO CARB STEADY), heparin, hydrOXYzine, lidocaine (PF), lidocaine-prilocaine, methocarbamol, ondansetron **OR** ondansetron (ZOFRAN) IV, oxyCODONE, pentafluoroprop-tetrafluoroeth, sorbitol, zolpidem  No Known Allergies  Social History   Socioeconomic History   Marital status: Single    Spouse name: Not on file   Number of children: Not on file   Years of education: Not on file   Highest education level: Not on file  Occupational History   Occupation: unemployed  Tobacco Use   Smoking status: Some Days    Packs/day: 0.25    Types: Cigarettes    Last attempt to quit: 10/08/2020    Years since quitting: 1.3   Smokeless tobacco: Never  Vaping Use   Vaping Use: Never used  Substance and Sexual Activity   Alcohol use: Not Currently   Drug use: Not Currently    Types: Marijuana    Comment: 3 times a week    Sexual activity: Not Currently    Birth control/protection: None  Other Topics Concern   Not on  file  Social History Narrative   Not on file   Social Determinants of Health   Financial Resource Strain: Not on file  Food Insecurity: Not on file  Transportation Needs: Not on file  Physical Activity: Not on file  Stress: Not on file  Social Connections: Not on file  Intimate Partner Violence: Not on file   Breast Cancer-relatedfamily history is not on file.  Vitals BP 130/89    Pulse 72    Temp 98.2 F (36.8 C) (Temporal)    Resp 18    Ht 5\' 7"  (1.702 m)    Wt 63.5 kg    SpO2 99%    BMI 21.93 kg/m    Physical Exam Constitutional: Lying in the bed, having hemodialysis, chills present    Comments:  Cardiovascular:     Rate and Rhythm: Normal rate    Heart sounds: Systolic murmur+  Pulmonary:     Effort: Pulmonary effort is normal on room air    Comments:   Abdominal:     Palpations: Abdomen is soft.     Tenderness: Generalized tenderness but no rebound tenderness  Musculoskeletal:        General: No swelling or tenderness.   Skin:    Comments: tattoos +  Neurological:     General: Grossly nonfocal, awake alert and oriented  Pertinent Microbiology Results for orders placed or performed during the hospital encounter of 01/29/22  Resp Panel by RT-PCR (Flu A&B, Covid) Nasopharyngeal Swab     Status: None   Collection Time: 01/29/22  2:18 AM   Specimen: Nasopharyngeal Swab; Nasopharyngeal(NP) swabs in vial transport medium  Result Value Ref Range Status   SARS Coronavirus 2 by RT PCR NEGATIVE NEGATIVE Final    Comment: (NOTE) SARS-CoV-2 target nucleic acids are NOT DETECTED.  The SARS-CoV-2 RNA is generally detectable in upper respiratory specimens during the acute phase of infection. The lowest concentration of SARS-CoV-2 viral copies this assay can detect is 138 copies/mL. A negative result does not preclude SARS-Cov-2 infection and should not be used as the sole basis for treatment or other patient management decisions. A negative result may occur with   improper specimen collection/handling, submission of specimen other than nasopharyngeal swab, presence of viral mutation(s) within the areas targeted by this assay, and inadequate number of viral copies(<138 copies/mL). A negative result must be combined with clinical observations, patient history, and epidemiological information. The expected result is Negative.  Fact Sheet for Patients:  EntrepreneurPulse.com.au  Fact Sheet for Healthcare Providers:  IncredibleEmployment.be  This test is no t yet approved or cleared by the [place] FDA and  has been authorized for detection and/or diagnosis of SARS-CoV-2 by FDA under an Emergency Use Authorization (EUA). This EUA will remain  in effect (meaning this test can be used) for the duration of the COVID-19 declaration under Section 564(b)(1) of the Act, 21 U.S.C.section 360bbb-3(b)(1), unless the authorization is terminated  or revoked sooner.       Influenza A by PCR NEGATIVE NEGATIVE Final   Influenza B by PCR NEGATIVE NEGATIVE Final    Comment: (NOTE) The Xpert Xpress SARS-CoV-2/FLU/RSV plus assay is intended as an aid in the diagnosis of influenza from Nasopharyngeal swab specimens and should not be used as a sole basis for treatment. Nasal washings and aspirates are unacceptable for Xpert Xpress SARS-CoV-2/FLU/RSV testing.  Fact Sheet for Patients: EntrepreneurPulse.com.au  Fact Sheet for Healthcare Providers: IncredibleEmployment.be  This test is not yet approved or cleared by the [place] FDA and has been authorized for detection and/or diagnosis of SARS-CoV-2 by FDA under an Emergency Use Authorization (EUA). This EUA will remain in effect (meaning this test can be used) for the duration of the COVID-19 declaration under Section 564(b)(1) of the Act, 21 U.S.C. section 360bbb-3(b)(1), unless the authorization is terminated  or revoked.  Performed at [facility], [place] [address], [place], [place] [address]   Blood Culture (routine x 2)     Status: None (Preliminary result)   Collection Time: 01/29/22  2:30 AM   Specimen: BLOOD LEFT ARM  Result Value Ref Range Status   Specimen Description BLOOD LEFT ARM  Final   Special Requests   Final    BOTTLES DRAWN AEROBIC AND ANAEROBIC Blood Culture results may not be optimal  due to an excessive volume of blood received in culture bottles   Culture  Setup Time   Final    GRAM POSITIVE COCCI IN CLUSTERS IN BOTH AEROBIC AND ANAEROBIC BOTTLES CRITICAL RESULT CALLED TO, READ BACK BY AND VERIFIED WITH: PHARMD M.WEISKOPH AT 8119 ON 01/29/2022 BY T.SAAD. Performed at [facility], [place] [address] [place], [place] [address]    Culture GRAM POSITIVE COCCI  Final   Report Status PENDING  Incomplete  Blood Culture ID Panel (Reflexed)     Status: Abnormal   Collection Time: 01/29/22  2:30 AM  Result Value Ref Range Status   Enterococcus faecalis NOT DETECTED NOT DETECTED Final   Enterococcus Faecium NOT DETECTED NOT DETECTED Final   Listeria monocytogenes NOT DETECTED NOT DETECTED Final   Staphylococcus species DETECTED (A) NOT DETECTED Final    Comment: CRITICAL RESULT CALLED TO, READ BACK BY AND VERIFIED WITH: PHARMD M.WEISKOPH AT 1449 ON 01/29/2022 BY T.SAAD.    Staphylococcus aureus (BCID) DETECTED (A) NOT DETECTED Final    Comment: Methicillin (oxacillin)-resistant Staphylococcus aureus (MRSA). MRSA is predictably resistant to beta-lactam antibiotics (except ceftaroline). Preferred therapy is vancomycin unless clinically contraindicated. Patient requires contact precautions if  hospitalized. CRITICAL RESULT CALLED TO, READ BACK BY AND VERIFIED WITH: PHARMD M.WEISKOPH AT 1449 ON 01/29/2022 BY T.SAAD.    Staphylococcus epidermidis NOT DETECTED NOT DETECTED Final   Staphylococcus lugdunensis NOT DETECTED NOT DETECTED Final   Streptococcus species NOT  DETECTED NOT DETECTED Final   Streptococcus agalactiae NOT DETECTED NOT DETECTED Final   Streptococcus pneumoniae NOT DETECTED NOT DETECTED Final   Streptococcus pyogenes NOT DETECTED NOT DETECTED Final   A.calcoaceticus-baumannii NOT DETECTED NOT DETECTED Final   Bacteroides fragilis NOT DETECTED NOT DETECTED Final   Enterobacterales NOT DETECTED NOT DETECTED Final   Enterobacter cloacae complex NOT DETECTED NOT DETECTED Final   Escherichia coli NOT DETECTED NOT DETECTED Final   Klebsiella aerogenes NOT DETECTED NOT DETECTED Final   Klebsiella oxytoca NOT DETECTED NOT DETECTED Final   Klebsiella pneumoniae NOT DETECTED NOT DETECTED Final   Proteus species NOT DETECTED NOT DETECTED Final   Salmonella species NOT DETECTED NOT DETECTED Final   Serratia marcescens NOT DETECTED NOT DETECTED Final   Haemophilus influenzae NOT DETECTED NOT DETECTED Final   Neisseria meningitidis NOT DETECTED NOT DETECTED Final   Pseudomonas aeruginosa NOT DETECTED NOT DETECTED Final   Stenotrophomonas maltophilia NOT DETECTED NOT DETECTED Final   Candida albicans NOT DETECTED NOT DETECTED Final   Candida auris NOT DETECTED NOT DETECTED Final   Candida glabrata NOT DETECTED NOT DETECTED Final   Candida krusei NOT DETECTED NOT DETECTED Final   Candida parapsilosis NOT DETECTED NOT DETECTED Final   Candida tropicalis NOT DETECTED NOT DETECTED Final   Cryptococcus neoformans/gattii NOT DETECTED NOT DETECTED Final   Meth resistant mecA/C and MREJ DETECTED (A) NOT DETECTED Final    Comment: CRITICAL RESULT CALLED TO, READ BACK BY AND VERIFIED WITH: PHARMD M.WEISKOPH AT 1449 ON 01/29/2022 BY T.SAAD. Performed at [facility], [place] [address] [place], [place] [address]   Blood Culture (routine x 2)     Status: None (Preliminary result)   Collection Time: 01/29/22  2:40 AM   Specimen: BLOOD LEFT HAND  Result Value Ref Range Status   Specimen Description BLOOD LEFT HAND  Final   Special Requests   Final     BOTTLES DRAWN AEROBIC ONLY Blood Culture results may not be optimal due to an excessive volume of blood received  in culture bottles   Culture  Setup Time   Final    GRAM POSITIVE COCCI IN CLUSTERS AEROBIC BOTTLE ONLY CRITICAL VALUE NOTED.  VALUE IS CONSISTENT WITH PREVIOUSLY REPORTED AND CALLED VALUE. Performed at [facility], [place] [address], [place], [place] [address]    Culture GRAM POSITIVE COCCI  Final   Report Status PENDING  Incomplete  Culture, blood (single)     Status: None (Preliminary result)   Collection Time: 01/29/22  5:56 AM   Specimen: BLOOD  Result Value Ref Range Status   Specimen Description BLOOD CENTRAL LINE  Final   Special Requests   Final    BOTTLES DRAWN AEROBIC AND ANAEROBIC Blood Culture adequate volume   Culture   Final    NO GROWTH < 12 HOURS Performed at [facility], [place] [address] [place], [place] [address]    Report Status PENDING  Incomplete    Pertinent Lab seen by me: CBC Latest Ref Rng & Units 01/29/2022 01/08/2022 01/07/2022  WBC 4.0 - 10.5 K/uL 15.4(H) 9.0 8.4  Hemoglobin 12.0 - 15.0 g/dL 10.9(L) 8.2(L) 8.0(L)  Hematocrit 36.0 - 46.0 % 35.1(L) 26.1(L) 25.2(L)  Platelets 150 - 400 K/uL 176 333 294   CMP Latest Ref Rng & Units 01/29/2022 01/08/2022 01/07/2022  Glucose 70 - 99 mg/dL 93 105(H) 88  BUN 6 - 20 mg/dL 85(H) 35(H) 54(H)  Creatinine 0.44 - 1.00 mg/dL 9.33(H) 4.54(H) 6.46(H)  Sodium 135 - 145 mmol/L 136 135 133(L)  Potassium 3.5 - 5.1 mmol/L 5.9(H) 4.3 5.1  Chloride 98 - 111 mmol/L 96(L) 97(L) 96(L)  CO2 22 - 32 mmol/L 22 25 27   Calcium 8.9 - 10.3 mg/dL 8.6(L) 8.3(L) 7.7(L)  Total Protein 6.5 - 8.1 g/dL 6.2(L) - -  Total Bilirubin 0.3 - 1.2 mg/dL 0.9 - -  Alkaline Phos 38 - 126 U/L 192(H) - -  AST 15 - 41 U/L 30 - -  ALT 0 - 44 U/L 44 - -    Pertinent Imagings/Other Imagings Plain films and CT images have been personally visualized and interpreted; radiology reports have been reviewed. Decision making incorporated into the  Impression / Recommendations. DG Chest 2 View  Result Date: 01/29/2022 CLINICAL DATA:  Possible sepsis. EXAM: CHEST - 2 VIEW COMPARISON:  Chest radiograph dated 01/05/2022. FINDINGS: Dialysis catheter in similar position. Small bilateral pleural effusions with bibasilar atelectasis. No pneumothorax. Stable cardiomegaly. No acute osseous pathology. IMPRESSION: 1. Small bilateral pleural effusions with bibasilar atelectasis. 2. Cardiomegaly. Electronically Signed   By: Anner Crete M.D.   On: 01/29/2022 03:04     I spent more than 80 minutes for this patient encounter including review of prior medical records/discussing diagnostics and treatment plan with the patient/family/coordinate care with primary/other specialits with greater than 50% of time in face to face encounter.   Electronically signed by:   Rosiland Oz, MD Infectious Disease Physician [facility] Pager: [phone]

## 2022-01-29 NOTE — Assessment & Plan Note (Signed)
-  Continue Plaquenil, prednisone ?

## 2022-01-29 NOTE — ED Notes (Addendum)
PT reports she had dialysis yesterday.  ? ?Edit:  when Hospitalist rounded, Pt reported she did not get dialysis yesterday.  ?

## 2022-01-29 NOTE — Sepsis Progress Note (Signed)
Notified provider and bedside nurse of need to order and draw repeat lactic acid (3rd). ? ?

## 2022-01-29 NOTE — ED Notes (Signed)
Pt Placement made aware that the Pt went to dialysis.  ?

## 2022-01-29 NOTE — ED Notes (Signed)
Dialysis Ctr called and ready for Pt.  They are entering her into transport.  ?

## 2022-01-29 NOTE — Sepsis Progress Note (Signed)
Monitoring for the code sepsis protocol. °

## 2022-01-29 NOTE — ED Provider Triage Note (Signed)
Emergency Medicine Provider Triage Evaluation Note ? ?Cathy [name] , a 41 y.o. female  was evaluated in triage.  Pt complains of chills, generalized body aches, headache, shortness of breath, nausea and diarrhea.  Patient on dialysis, did not go yesterday due to feeling poorly but did have a make-up dialysis treatment from last week on Monday.  Reports she got started on blood thinners 2 weeks ago and has been feeling off since then but started feeling significantly worse over the past few days. ? ?Review of Systems  ?Positive: Fever, chills, body aches, headache, shortness of breath, abdominal pain, nausea, diarrhea ?Negative: Syncope, rash, numbness, weakness, vision changes ? ?Physical Exam  ?BP (!) 159/124 (BP Location: Right Arm)   Pulse 81   Temp (!) 103.1 ?F (39.5 ?C) (Oral)   Resp 17   Ht 5\' 7"  (1.702 m)   Wt 63.5 kg   SpO2 100%   BMI 21.93 kg/m?  ?Gen:   Awake, ill-appearing ?Resp:  Normal effort, lungs clear bilaterally ?MSK:   Moves extremities without difficulty  ?Other:  Mild generalized abdominal tenderness ? ?Medical Decision Making  ?Medically screening exam initiated at 2:24 AM.  Appropriate orders placed.  Cathy [name] was informed that the remainder of the evaluation will be completed by another provider, this initial triage assessment does not replace that evaluation, and the importance of remaining in the ED until their evaluation is complete. ? ?Pt ill appearing, needs room for further evaluation ?  ?Jacqlyn Larsen, PA-C ?01/29/22 1410 ? ?

## 2022-01-29 NOTE — Progress Notes (Signed)
Pharmacy Antibiotic Note ? ?[name] is a 41 y.o. female admitted on 01/29/2022 with sepsis.  Pharmacy has been consulted for Vancomycin/Cefepime dosing. Just discharged a few weeks ago. WBC elevated. Pt febrile. ESRD on HD TTS.  ? ?Plan: ?Vancomycin 1250 mg IV x 1, then 750 mg IV qHD TTS ?Cefepime 1g IV q24h ?Trend WBC, temp, HD schedule ?F/U infectious work-up ?Drug levels as indicated ? ? ?Height: 5\' 7"  (170.2 cm) ?Weight: 63.5 kg (140 lb) ?IBW/kg (Calculated) : 61.6 ? ?Temp (24hrs), Avg:101.3 ?F (38.5 ?C), Min:99.4 ?F (37.4 ?C), Max:103.1 ?F (39.5 ?C) ? ?Recent Labs  ?Lab 01/29/22 ?0246  ?WBC 15.4*  ?CREATININE 9.33*  ?LATICACIDVEN 3.0*  ?  ?Estimated Creatinine Clearance: 8 mL/min (A) (by C-G formula based on SCr of 9.33 mg/dL (H)).   ? ?No Known Allergies ? ?Cathy [name], PharmD, BCPS ?Clinical Pharmacist ?Phone: [phone] ? ? ?

## 2022-01-29 NOTE — Assessment & Plan Note (Signed)
-  Patient on chronic TTS HD ?-Nephrology prn order set utilized ?-She does needs HD and needs improved compliance ?-Nephrology is consulting ?-Continue Phoslo ?

## 2022-01-29 NOTE — ED Provider Notes (Signed)
[facility] EMERGENCY DEPARTMENT Provider Note  CSN: [id number] Arrival date & time: 01/29/22 0216  Chief Complaint(s) Fever, Weakness, Shortness of Breath, and Headache  HPI Cathy [name] is a 41 y.o. female with extensive past medical history listed below including SLE, ESRD on dialysis TTS through a PermCath in the right chest, malignant hypertension, chronic systolic/diastolic heart failure, pericarditis with pericardial effusion, recently diagnosed with PEs currently on Eliquis.  She is here for 2 days of generalized fatigue, myalgias, headache, nausea, diarrhea.  Reports that she missed dialysis yesterday due to feeling poorly.  Denies any emesis.  Endorses generalized abdominal discomfort.  Also endorsing cough and mild shortness of breath.   Reports that the symptoms began after having dialysis on Saturday.  PermCath was last changed 1 year ago.  Patient noted that on Saturday she began having discomfort to the area just above the PermCath insertion site.  The history is provided by the patient.   Past Medical History Past Medical History:  Diagnosis Date   Anasarca associated with disorder of kidney 06/08/2021   Anxiety    Asthma    Bipolar depression ([place])    Depression    Dyspnea    ESRD (end stage renal disease) on dialysis ([place]) 10/2020   GERD (gastroesophageal reflux disease)    Gonorrhea    Lupus (HCC)    Pelvic inflammatory disease (PID)    Pleural effusion 06/08/2021   Renal hypertension    Trichomonas infection    Patient Active Problem List   Diagnosis Date Noted   Sepsis ([place]) 01/29/2022   Acute pulmonary embolus ([place]) 01/07/2022   Malignant hypertension 01/06/2022   Hypertensive urgency 01/06/2022   Anemia of chronic kidney failure 12/28/2021   Chronic combined systolic and diastolic congestive heart failure ([place]) 12/27/2021   PNA (pneumonia) 12/15/2021   Benign essential HTN 06/08/2021   HFrEF (heart failure with reduced ejection  fraction) ([place])    Encounter for antineoplastic chemotherapy 01/07/2021   ESRD on hemodialysis ([place]) 12/23/2020   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   Hyperkalemia 11/29/2020   Pericardial effusion    Aortic atherosclerosis ([place]) 11/07/2020   Pelvic adhesive disease 02/03/2019   Renal hypertension 02/02/2019   Non compliance w medication regimen 01/12/2019   Current chronic use of systemic steroids 01/12/2019   Anemia 10/12/2018   Systemic lupus erythematosus (SLE) with pericarditis ([place]) 09/20/2018   Supervision of pregnancy with grand multiparity in second trimester 09/20/2018   Substance abuse ([place]) 09/20/2018   MDD (major depressive disorder) 10/11/2015   Asthma 09/22/2011   Home Medication(s) Prior to Admission medications   Medication Sig Start Date End Date Taking? Authorizing Provider  acetaminophen (TYLENOL) 500 MG tablet Take 1,000 mg by mouth every 6 (six) hours as needed for moderate pain.    [provider]  albuterol (VENTOLIN HFA) 108 (90 Base) MCG/ACT inhaler Inhale 2 puffs into the lungs every 6 (six) hours as needed for wheezing or shortness of breath. 12/17/21 12/17/22  Rai, Ripudeep K, MD  amLODipine (NORVASC) 10 MG tablet Take 1 tablet (10 mg total) by mouth daily. 01/09/22   Shelly Coss, MD  APIXABAN [place]ne [place]) VTE STARTER PACK (10MG  AND 5MG ) Take 10 mg by mouth 2 (two) times daily for 6 days. Then take 5 mg by mouth once a day. 01/09/22 01/15/22  Shelly Coss, MD  apixaban (ELIQUIS) 5 MG TABS tablet Take 1 tablet (5 mg total) by mouth 2 (two) times daily. 01/15/22   Shelly Coss, MD  Blood  Pressure Monitoring (OMRON 3 SERIES BP MONITOR) DEVI Use as directed 2 times a day 01/09/22   Shelly Coss, MD  calcium acetate (PHOSLO) 667 MG capsule Take 2 capsules (1,334 mg total) by mouth 3 (three) times daily with meals. 01/09/22   Shelly Coss, MD  carvedilol (COREG) 12.5 MG tablet Take 1 tablet (12.5 mg total) by mouth 2 (two) times daily with a meal. 01/09/22    Shelly Coss, MD  cloNIDine (CATAPRES) 0.3 MG tablet Take 1 tablet (0.3 mg total) by mouth 3 (three) times daily. 01/09/22   Shelly Coss, MD  colchicine 0.6 MG tablet Take 0.5 tablets (0.3 mg total) by mouth daily. 12/31/21   Arrien, Jimmy Picket, MD  guaiFENesin (MUCINEX) 600 MG 12 hr tablet Take 600 mg by mouth 2 (two) times daily as needed for cough.    [provider]  hydrALAZINE (APRESOLINE) 100 MG tablet Take 1 tablet (100 mg total) by mouth 3 (three) times daily. 01/09/22   Shelly Coss, MD  hydroxychloroquine (PLAQUENIL) 200 MG tablet Take 1/2 tablet (100 mg total) by mouth every other day after hemodialysis 01/09/22   Shelly Coss, MD  hydrOXYzine (ATARAX) 25 MG tablet Take 1 tablet (25 mg total) by mouth 3 (three) times daily as needed for itching or anxiety. 12/17/21   Rai, Vernelle Emerald, MD  ipratropium (ATROVENT HFA) 17 MCG/ACT inhaler Inhale 2 puffs into the lungs every 6 (six) hours as needed for wheezing. 12/17/21 12/17/22  Rai, Vernelle Emerald, MD  Menthol-Methyl Salicylate (ICY HOT) 17-00 % STCK Apply 1 application topically as needed (pain).    [provider]  methocarbamol (ROBAXIN) 500 MG tablet Take 1 tablet (500 mg total) by mouth every 6 (six) hours as needed for muscle spasms. 12/17/21   Rai, Ripudeep K, MD  nicotine (NICODERM CQ - DOSED IN MG/24 HOURS) 21 mg/24hr patch Place 1 patch (21 mg total) onto the skin daily. 01/10/22   Shelly Coss, MD  Nutritional Supplements (,FEEDING SUPPLEMENT, PROSOURCE PLUS) liquid Take 30 mLs by mouth 3 (three) times daily between meals. Patient not taking: Reported on 01/06/2022 06/14/21   Edwin Dada, MD  oxyCODONE (OXY IR/ROXICODONE) 5 MG immediate release tablet Take 1 tablet (5 mg total) by mouth every 6 (six) hours as needed for severe pain. 12/30/21   Arrien, Jimmy Picket, MD  predniSONE (DELTASONE) 20 MG tablet Take 2 tablets (40 mg total) by mouth daily with breakfast. 12/31/21 01/30/22  Arrien, Jimmy Picket, MD  torsemide (DEMADEX) 20 MG tablet Take 1 tablet (20 mg total) by mouth 2 (two) times daily. 01/09/22   Shelly Coss, MD  fenofibrate 160 MG tablet Take 1 tablet (160 mg total) by mouth daily. 12/31/20 01/20/21  Elmarie Shiley, MD  Allergies Patient has no known allergies.  Review of Systems Review of Systems As noted in HPI  Physical Exam Vital Signs  I have reviewed the triage vital signs BP (!) 145/111 (BP Location: Right Arm)    Pulse 71    Temp 103.1 F (39.5) (Oral)    Resp (!) 25    Ht 5\' 7"  (1.702 m)    Wt 63.5 kg    SpO2 99%    BMI 21.93 kg/m   Physical Exam Vitals reviewed.  Constitutional:      General: She is not in acute distress.    Appearance: She is well-developed. She is ill-appearing. She is not diaphoretic.  HENT:     Head: Normocephalic and atraumatic.     Nose: Nose normal.  Eyes:     General: No scleral icterus.       Right eye: No discharge.        Left eye: No discharge.     Conjunctiva/sclera: Conjunctivae normal.     Pupils: Pupils are equal, round, and reactive to light.  Cardiovascular:     Rate and Rhythm: Normal rate and regular rhythm.     Heart sounds: No murmur heard.   No friction rub. No gallop.  Pulmonary:     Effort: Pulmonary effort is normal. Tachypnea present. No respiratory distress.     Breath sounds: Normal breath sounds. No stridor or decreased air movement. No wheezing, rhonchi or rales.  Abdominal:     General: There is no distension.     Palpations: Abdomen is soft.     Tenderness: There is generalized abdominal tenderness (mild). There is no guarding or rebound.  Musculoskeletal:        General: No tenderness.     Cervical back: Normal range of motion and neck supple.  Skin:    General: Skin is warm and dry.     Findings: No erythema or rash.  Neurological:     Mental Status: She  is alert and oriented to person, place, and time.    ED Results and Treatments Labs (all labs ordered are listed, but only abnormal results are displayed) Labs Reviewed  LACTIC ACID, PLASMA - Abnormal; Notable for the following components:      Result Value   Lactic Acid, Venous 3.0 (*)    All other components within normal limits  COMPREHENSIVE METABOLIC PANEL - Abnormal; Notable for the following components:   Potassium 5.9 (*)    Chloride 96 (*)    BUN 85 (*)    Creatinine, Ser 9.33 (*)    Calcium 8.6 (*)    Total Protein 6.2 (*)    Albumin 2.8 (*)    Alkaline Phosphatase 192 (*)    GFR, Estimated 5 (*)    Anion gap 18 (*)    All other components within normal limits  CBC WITH DIFFERENTIAL/PLATELET - Abnormal; Notable for the following components:   WBC 15.4 (*)    RBC 3.74 (*)    Hemoglobin 10.9 (*)    HCT 35.1 (*)    RDW 23.2 (*)    nRBC 0.6 (*)    Neutro Abs 14.2 (*)    Lymphs Abs 0.4 (*)    Abs Immature Granulocytes 0.30 (*)    All other components within normal limits  PROTIME-INR - Abnormal; Notable for the following components:   Prothrombin Time 23.5 (*)    INR 2.1 (*)    All other components within normal limits  I-STAT BETA HCG BLOOD,  ED (MC, WL, AP ONLY) - Abnormal; Notable for the following components:   I-stat hCG, quantitative 47.1 (*)    All other components within normal limits  RESP PANEL BY RT-PCR (FLU A&B, COVID) ARPGX2  CULTURE, BLOOD (ROUTINE X 2)  CULTURE, BLOOD (ROUTINE X 2)  CULTURE, BLOOD (SINGLE)  APTT  HCG, QUANTITATIVE, PREGNANCY  LACTIC ACID, PLASMA  URINALYSIS, ROUTINE W REFLEX MICROSCOPIC                                                                                                                         EKG  EKG Interpretation  Date/Time:  Wednesday January 29 2022 05:57:01 EST Ventricular Rate:  67 PR Interval:  141 QRS Duration: 75 QT Interval:  445 QTC Calculation: 470 R Axis:   79 Text Interpretation: Sinus rhythm Left  atrial enlargement No acute changes Confirmed by Addison Lank [phone]) on 01/29/2022 6:01:25 AM       Radiology DG Chest 2 View  Result Date: 01/29/2022 CLINICAL DATA:  Possible sepsis. EXAM: CHEST - 2 VIEW COMPARISON:  Chest radiograph dated 01/05/2022. FINDINGS: Dialysis catheter in similar position. Small bilateral pleural effusions with bibasilar atelectasis. No pneumothorax. Stable cardiomegaly. No acute osseous pathology. IMPRESSION: 1. Small bilateral pleural effusions with bibasilar atelectasis. 2. Cardiomegaly. Electronically Signed   By: Anner Crete M.D.   On: 01/29/2022 03:04    Pertinent labs & imaging results that were available during my care of the patient were reviewed by me and considered in my medical decision making (see MDM for details).  Medications Ordered in ED Medications  0.9 %  sodium chloride infusion (has no administration in time range)  sodium chloride 0.9 % bolus 1,000 mL (1,000 mLs Intravenous New Bag/Given 01/29/22 0652)  ceFEPIme (MAXIPIME) 2 g in sodium chloride 0.9 % 100 mL IVPB (2 g Intravenous New Bag/Given 01/29/22 0646)  metroNIDAZOLE (FLAGYL) IVPB 500 mg (500 mg Intravenous New Bag/Given 01/29/22 0648)  Vancomycin (VANCOCIN) 1,250 mg in sodium chloride 0.9 % 250 mL IVPB (has no administration in time range)  ceFEPIme (MAXIPIME) 1 g in sodium chloride 0.9 % 100 mL IVPB (has no administration in time range)  vancomycin (VANCOCIN) 750 mg in sodium chloride 0.9 % 250 mL IVPB (has no administration in time range)  sodium zirconium cyclosilicate (LOKELMA) packet 10 g (has no administration in time range)  Chlorhexidine Gluconate Cloth 2 % PADS 6 each (has no administration in time range)  acetaminophen (TYLENOL) tablet 1,000 mg (1,000 mg Oral Not Given 01/29/22 0314)  HYDROmorphone (DILAUDID) injection 0.5 mg (0.5 mg Intravenous Given 01/29/22 0641)  ondansetron (ZOFRAN) injection 4 mg (4 mg Intravenous Given 01/29/22 0641)  Procedures .Critical Care Performed by: Fatima Blank, MD Authorized by: Fatima Blank, MD   Critical care provider statement:    Critical care time (minutes):  60   Critical care time was exclusive of:  Separately billable procedures and treating other patients   Critical care was necessary to treat or prevent imminent or life-threatening deterioration of the following conditions:  Sepsis   Critical care was time spent personally by me on the following activities:  Development of treatment plan with patient or surrogate, discussions with consultants, evaluation of patient's response to treatment, examination of patient, obtaining history from patient or surrogate, review of old charts, re-evaluation of patient's condition, pulse oximetry, ordering and review of radiographic studies, ordering and review of laboratory studies and ordering and performing treatments and interventions   Care discussed with: admitting provider    (including critical care time)  Medical Decision Making / ED Course    Complexity of Problem:  Co morbidities that complicate the patient evaluation Multiple comorbidities listed above. In addition patient has had h/o bacteremia from MSSA.  Additional history obtained: Record review from prior admission to hospital notable for MSSA bacteremia in 05/2021, recent CAP in 12/2021  Patient's presenting problem/concern and DDX listed below: Fever Sepsis.  Less likely drug reaction or lupus flare. Missed HD SOB    Complexity of Data:   Cardiac Monitoring: The patient was maintained on a cardiac monitor.   I personally viewed and interpreted the cardiac monitored which showed an underlying rhythm of normal sinus rhythm with a rate in the 70s.  No dysrhythmias or blocks.   Laboratory Tests ordered listed below with my independent interpretation: CBC with  leukocytosis.  Stable hemoglobin.  No thrombocytopenia. Metabolic panel with mild hyperkalemia at 5.9.  Consistent with ESRD. Elevated lactic acid at 3.0 Peripheral cultures drawn.  We will attempt to draw culture from permacath as well. COVID/influenza negative.   Imaging Studies ordered listed below with my independent interpretation: Chest x-ray without evidence of pneumonia, pulmonary edema.  There are mild pleural effusions.      ED Course:    Based on above concerns, hospitalization possible: Yes  Assessment, Intervention, and Reassessment: Sepsis Code sepsis initiated and patient started on empiric antibiotics for unknown source. Possible sources include bacteremia, catheter associated infection, UTI/pyelo (as patient states she still makes urine, but UA not collected yet) Patient is not exhibiting any nuchal rigidity concerning for meningitis, regardless patient is anticoagulated and will not be able to have LP performed in the emergency department.  No evidence of pneumonia on chest x-ray. IVF - does not require 30/Kg at this time IV pain meds  Missed dialysis Hyperkalemia - no EKG changes Temporized with IV fluids and [facility] neph[facility] to have PermCath replaced and to coordinate for dialysis. I spoke with Dr. Hollie Salk who will coordinate line holiday and change of permcath as well as HD   Disposition: Consulted hospitalist service and spoke with Dr. Sidney Ace. He agreed to admit for further management.  Final Clinical Impression(s) / ED Diagnoses Final diagnoses:  Sepsis ([place])           This chart was dictated using voice recognition software.  Despite best efforts to proofread,  errors can occur which can change the documentation meaning.    Fatima Blank, MD 01/29/22 [phone]

## 2022-01-29 NOTE — Assessment & Plan Note (Signed)
>>  ASSESSMENT AND PLAN FOR TOBACCO DEPENDENCE WRITTEN ON 01/29/2022  7:48 PM BY Jonah Blue, MD  -Encourage cessation.   -Patch ordered

## 2022-01-29 NOTE — Assessment & Plan Note (Signed)
-  Volume maintained via HD ?

## 2022-01-29 NOTE — Progress Notes (Signed)
PHARMACY - PHYSICIAN COMMUNICATION ?CRITICAL VALUE ALERT - BLOOD CULTURE IDENTIFICATION (BCID) ? ?Cathy [name] is an 41 y.o. female who presented to [facility] on [facility]/29/2022 with a chief complaint of fever, weakness, SOB and headache for 2 days. She is ill appearing, has tachypnea, generalized abdominal tenderness, LA 3.1, WBC 15.4, and Tmax 99.61F. ? ?Assessment: Due to 3/5 cultures being positive for MRSA in addition to the severity of her symptoms, not likely a contaminate. ? ?Name of physician (or Provider) Contacted:  ?Eugenie Norrie, MD (off at [place]), then Patricia Pesa NP and Dr. Lorin Mercy notified ?ID team notified, Janene Madeira NP and Dr. West Bali ? ?Changes to prescribed antibiotics required:  ?Currently on cefepime and vancomycin, continue vancomcyin and discontinue cefepime. ? ? ?Results for orders placed or performed during the hospital encounter of 01/29/22  ?Blood Culture ID Panel (Reflexed) (Collected: 01/29/2022  2:30 AM)  ?Result Value Ref Range  ? Enterococcus faecalis NOT DETECTED NOT DETECTED  ? Enterococcus Faecium NOT DETECTED NOT DETECTED  ? Listeria monocytogenes NOT DETECTED NOT DETECTED  ? Staphylococcus species DETECTED (A) NOT DETECTED  ? Staphylococcus aureus (BCID) DETECTED (A) NOT DETECTED  ? Staphylococcus epidermidis NOT DETECTED NOT DETECTED  ? Staphylococcus lugdunensis NOT DETECTED NOT DETECTED  ? Streptococcus species NOT DETECTED NOT DETECTED  ? Streptococcus agalactiae NOT DETECTED NOT DETECTED  ? Streptococcus pneumoniae NOT DETECTED NOT DETECTED  ? Streptococcus pyogenes NOT DETECTED NOT DETECTED  ? A.calcoaceticus-baumannii NOT DETECTED NOT DETECTED  ? Bacteroides fragilis NOT DETECTED NOT DETECTED  ? Enterobacterales NOT DETECTED NOT DETECTED  ? Enterobacter cloacae complex NOT DETECTED NOT DETECTED  ? Escherichia coli NOT DETECTED NOT DETECTED  ? Klebsiella aerogenes NOT DETECTED NOT DETECTED  ? Klebsiella oxytoca NOT DETECTED NOT DETECTED  ? Klebsiella pneumoniae NOT DETECTED  NOT DETECTED  ? Proteus species NOT DETECTED NOT DETECTED  ? Salmonella species NOT DETECTED NOT DETECTED  ? Serratia marcescens NOT DETECTED NOT DETECTED  ? Haemophilus influenzae NOT DETECTED NOT DETECTED  ? Neisseria meningitidis NOT DETECTED NOT DETECTED  ? Pseudomonas aeruginosa NOT DETECTED NOT DETECTED  ? Stenotrophomonas maltophilia NOT DETECTED NOT DETECTED  ? Candida albicans NOT DETECTED NOT DETECTED  ? Candida auris NOT DETECTED NOT DETECTED  ? Candida glabrata NOT DETECTED NOT DETECTED  ? Candida krusei NOT DETECTED NOT DETECTED  ? Candida parapsilosis NOT DETECTED NOT DETECTED  ? Candida tropicalis NOT DETECTED NOT DETECTED  ? Cryptococcus neoformans/gattii NOT DETECTED NOT DETECTED  ? Meth resistant mecA/C and MREJ DETECTED (A) NOT DETECTED  ? ? ?Jerilynn Birkenhead ?01/29/2022  3:00 PM ? ?

## 2022-01-30 ENCOUNTER — Inpatient Hospital Stay (HOSPITAL_COMMUNITY): Payer: Medicare Other

## 2022-01-30 DIAGNOSIS — B9562 Methicillin resistant Staphylococcus aureus infection as the cause of diseases classified elsewhere: Secondary | ICD-10-CM | POA: Diagnosis not present

## 2022-01-30 DIAGNOSIS — N186 End stage renal disease: Secondary | ICD-10-CM

## 2022-01-30 DIAGNOSIS — R7881 Bacteremia: Secondary | ICD-10-CM

## 2022-01-30 DIAGNOSIS — I2699 Other pulmonary embolism without acute cor pulmonale: Secondary | ICD-10-CM

## 2022-01-30 DIAGNOSIS — M3214 Glomerular disease in systemic lupus erythematosus: Secondary | ICD-10-CM

## 2022-01-30 DIAGNOSIS — Z992 Dependence on renal dialysis: Secondary | ICD-10-CM

## 2022-01-30 LAB — ECHOCARDIOGRAM COMPLETE
AR max vel: 2.72 cm2
AV Area VTI: 2.51 cm2
AV Area mean vel: 2.53 cm2
AV Mean grad: 4 mmHg
AV Peak grad: 8 mmHg
Ao pk vel: 1.41 m/s
Area-P 1/2: 5.84 cm2
Height: 67 in
S' Lateral: 4 cm
Weight: 2239.87 oz

## 2022-01-30 LAB — CBC
HCT: 32.1 % — ABNORMAL LOW (ref 36.0–46.0)
Hemoglobin: 10 g/dL — ABNORMAL LOW (ref 12.0–15.0)
MCH: 28.6 pg (ref 26.0–34.0)
MCHC: 31.2 g/dL (ref 30.0–36.0)
MCV: 91.7 fL (ref 80.0–100.0)
Platelets: 155 10*3/uL (ref 150–400)
RBC: 3.5 MIL/uL — ABNORMAL LOW (ref 3.87–5.11)
RDW: 22.3 % — ABNORMAL HIGH (ref 11.5–15.5)
WBC: 11.8 10*3/uL — ABNORMAL HIGH (ref 4.0–10.5)
nRBC: 0.2 % (ref 0.0–0.2)

## 2022-01-30 LAB — BASIC METABOLIC PANEL
Anion gap: 14 (ref 5–15)
BUN: 42 mg/dL — ABNORMAL HIGH (ref 6–20)
CO2: 25 mmol/L (ref 22–32)
Calcium: 7.4 mg/dL — ABNORMAL LOW (ref 8.9–10.3)
Chloride: 95 mmol/L — ABNORMAL LOW (ref 98–111)
Creatinine, Ser: 5.87 mg/dL — ABNORMAL HIGH (ref 0.44–1.00)
GFR, Estimated: 9 mL/min — ABNORMAL LOW (ref >60)
Glucose, Bld: 89 mg/dL (ref 70–99)
Potassium: 4.7 mmol/L (ref 3.5–5.1)
Sodium: 134 mmol/L — ABNORMAL LOW (ref 135–145)

## 2022-01-30 MED ORDER — VANCOMYCIN VARIABLE DOSE PER UNSTABLE RENAL FUNCTION (PHARMACIST DOSING): Status: DC

## 2022-01-30 MED ORDER — VANCOMYCIN HCL 750 MG/150ML IV SOLN
750.0000 mg | Freq: Once | INTRAVENOUS | Status: AC
  Administered 2022-01-30: 750 mg via INTRAVENOUS
  Filled 2022-01-30: qty 150

## 2022-01-30 NOTE — Progress Notes (Signed)
?[facility] ?Progress Note  ? ?Subjective:    ?Seen and examined patient at bedside. Bedside ECHO in progress. She reports feeling tired. Tolerated yesterday's HD with net UF 3L. TDC removed 3/1-now on line holiday. BC X 2 drawn earlier this morning-pending results. ? ?Objective ?Vitals:  ? 01/30/22 0000 01/30/22 0006 01/30/22 0500 01/30/22 0741  ?BP: 113/84 113/84 (!) 117/91 122/86  ?Pulse: 74 74  83  ?Resp: (!) 25 20  20   ?Temp:  97.6 ?F (36.4 ?C) 100.1 ?F (37.8 ?C) 98.4 ?F (36.9 ?C)  ?TempSrc:  Oral Oral Oral  ?SpO2:  100% 100% 96%  ?Weight:      ?Height:      ? ?Physical Exam ?General: Ill-appearing; NAD ?Heart: S1 and S2; No murmurs, gallops, or rubs ?Lungs: Clear throughout; No wheezing, rales, or rhonchi ?Abdomen: Distended, soft, non-tender ?Extremities: 1+ edema BLLE ?Dialysis Access: [facility] removed 3/1-on line holiday ? ?Filed Weights  ? 01/29/22 0221 01/29/22 1135  ?Weight: 63.5 kg 63.5 kg  ? ? ?Intake/Output Summary (Last 24 hours) at 01/30/2022 1149 ?Last data filed at 01/29/2022 1542 ?Gross per 24 hour  ?Intake --  ?Output 3000 ml  ?Net -3000 ml  ? ? ?Additional Objective ?Labs: ?Basic Metabolic Panel: ?Recent Labs  ?Lab 01/29/22 ?0246 01/30/22 ?5643  ?NA 136 134*  ?K 5.9* 4.7  ?CL 96* 95*  ?CO2 22 25  ?GLUCOSE 93 89  ?BUN 85* 42*  ?CREATININE 9.33* 5.87*  ?CALCIUM 8.6* 7.4*  ? ?Liver Function Tests: ?Recent Labs  ?Lab 01/29/22 ?0246  ?AST 30  ?ALT 44  ?ALKPHOS 192*  ?BILITOT 0.9  ?PROT 6.2*  ?ALBUMIN 2.8*  ? ?No results for input(s): LIPASE, AMYLASE in the last 168 hours. ?CBC: ?Recent Labs  ?Lab 01/29/22 ?0246 01/30/22 ?3295  ?WBC 15.4* 11.8*  ?NEUTROABS 14.2*  --   ?HGB 10.9* 10.0*  ?HCT 35.1* 32.1*  ?MCV 93.9 91.7  ?PLT 176 155  ? ?Blood Culture ?   ?Component Value Date/Time  ? [place] LINE 01/29/2022 0556  ? SPECREQUEST  01/29/2022 0556  ?  BOTTLES DRAWN AEROBIC AND ANAEROBIC Blood Culture adequate volume  ? CULT  01/29/2022 0556  ?  NO GROWTH < 12 HOURS ?Performed at [facility], [place] [address] [place], [place] [address] ?  ? REPTSTATUS PENDING 01/29/2022 0556  ? ? ?Cardiac Enzymes: ?No results for input(s): CKTOTAL, CKMB, CKMBINDEX, TROPONINI in the last 168 hours. ?CBG: ?No results for input(s): GLUCAP in the last 168 hours. ?Iron Studies: No results for input(s): IRON, TIBC, TRANSFERRIN, FERRITIN in the last 72 hours. ?Lab Results  ?Component Value Date  ? INR 2.1 (H) 01/29/2022  ? INR 1.2 12/27/2021  ? INR 1.2 12/13/2021  ? ?Studies/Results: ?DG Chest 2 View ? ?Result Date: 01/29/2022 ?CLINICAL DATA:  Possible sepsis. EXAM: CHEST - 2 VIEW COMPARISON:  Chest radiograph dated 01/05/2022. FINDINGS: Dialysis catheter in similar position. Small bilateral pleural effusions with bibasilar atelectasis. No pneumothorax. Stable cardiomegaly. No acute osseous pathology. IMPRESSION: 1. Small bilateral pleural effusions with bibasilar atelectasis. 2. Cardiomegaly. Electronically Signed   By: Anner Crete M.D.   On: 01/29/2022 03:04  ? ?IR Removal Tun Cv Cath W/O FL ? ?Result Date: 01/30/2022 ?INDICATION: Patient with end-stage renal disease on HD via right IJ tunneled dialysis catheter placed 06/21/2021 by Dr. Pascal Lux. Patient admitted with line infection, bacteremia. Request made for tunneled dialysis catheter removal EXAM: REMOVAL TUNNELED CENTRAL VENOUS CATHETER MEDICATIONS: 12 mL 1% lidocaine. ANESTHESIA/SEDATION: None FLUOROSCOPY: None  COMPLICATIONS: None immediate. PROCEDURE: Informed written consent was obtained from the patient after a thorough discussion of the procedural risks, benefits and alternatives. All questions were addressed. Maximal Sterile Barrier Technique was utilized including caps, mask, sterile gowns, sterile gloves, sterile drape, hand hygiene and skin antiseptic. A timeout was performed prior to the initiation of the procedure. The patient's right chest and catheter was prepped and draped in a normal sterile fashion. Heparin was removed from both ports of  catheter. 1% lidocaine was used for local anesthesia. Using gentle blunt dissection the cuff of the catheter was exposed and the catheter was removed in its entirety. Bloody pus flowed from tract upon removal of catheter. Pressure was held till hemostasis was obtained. A sterile dressing was applied. The patient tolerated the procedure well with no immediate complications. Tip was not sent for culture as it was not requested and patient has blood cultures pending. IMPRESSION: Successful catheter removal as described above. Read by: Brynda Greathouse PA-C Electronically Signed   By: Ruthann Cancer M.D.   On: 01/30/2022 07:35   ? ?Medications: ? ? amLODipine  10 mg Oral Daily  ? apixaban  5 mg Oral BID  ? calcium acetate  1,334 mg Oral TID WC  ? carvedilol  12.5 mg Oral BID WC  ? Chlorhexidine Gluconate Cloth  6 each Topical Q0600  ? cloNIDine  0.3 mg Oral TID  ? colchicine  0.3 mg Oral Daily  ? hydrALAZINE  100 mg Oral TID  ? hydroxychloroquine  100 mg Oral Q48H  ? nicotine  14 mg Transdermal Daily  ? predniSONE  40 mg Oral Q breakfast  ? sodium chloride flush  3 mL Intravenous Q12H  ? sodium zirconium cyclosilicate  10 g Oral Daily  ? vancomycin variable dose per unstable renal function (pharmacist dosing)   Does not apply See admin instructions  ? ? ?Dialysis Orders: ?Dialyzes at [facility] TTS 4 hrs EDW 52 kg ?2K/ 2 Ca bath ?BFR 400 via TDC, DFR 800 ?Hectorol 3 q rx ?Venofer 100 q rx x 10, ending 02/08/22 ?Mircera 225 q 2 weeks, last given 01/25/22 ? ?Assessment/Plan: ?1 Sepsis- BC at admit (+) staph. TDC removed by IR 3/1-now on line holiday. Repeat BC X 2 drawn earlier today-results pending. ID following-on vanc. Bedside ECHO in progress. ?2 ESRD: TTS- has chronic underdialysis. On line holiday for bacteremia. Follow volume status and labs. ?3 Hypertension/volume- severely volume overloaded- 20 kg over by OP EDW.  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 Metabolic Bone Disease: hectorol and  binders ?6.  Lupus; on pred and plaquenil ?7.  Dispo: inpatient. ? ?Tobie Poet, NP ?[facility] ?01/30/2022,11:49 AM ? LOS: 1 day  ?  ?

## 2022-01-30 NOTE — Progress Notes (Signed)
PROGRESS NOTE        PATIENT DETAILS Name: Cathy [name] Age: 41 y.o. Sex: female Date of Birth: 1981/01/28 Admit Date: 01/29/2022 Admitting Physician Christel Mormon, MD PCP:Pcp, No  Brief Summary: Patient is a 41 y.o.  female with history of ESRD, lupus, HTN, HLD-history of chronic under dialysis-numerous recent hospitalizations-presented with fever-found to have MRSA bacteremia.  Significant Hospital events: 1/13-1/17>> RLL pneumonia, large parapneumonic effusion, s/p chest tube and cefepime  1/26-1/30>> RML pneumonia, pericarditis/ effusion (? Lupus vs uremic), and lupus flare 2/5/-2/9>> hypertensive urgency, PE, EF down to 20-25% 3/1>> admit for fever-found to have MRSA bacteremia. 3/1>> tunneled HD catheter removed after dialysis.  Significant imaging studies: 3/1>> CXR: Small bilateral pleural effusions.  Significant microbiology data: 3/1>> COVID/influenza PCR: Positive  Procedures: 3/1>> removal of tunneled dialysis catheter by IR. 3/1>> blood culture: Staph aureus 3/2>> blood culture: Pending  Consults:  ID, IR, nephrology  Subjective: Lying comfortably in bed-denies any chest pain or shortness of breath.  Continues to have pain at the right upper chest area-where her prior HD site was.  Claims she feels weak.  Objective: Vitals: Blood pressure 122/86, pulse 83, temperature 98.4 F (36.9 C), temperature source Oral, resp. rate 20, height 5\' 7"  (1.702 m), weight 63.5 kg, SpO2 96 %, unknown if currently breastfeeding.   Exam: Gen Exam:Alert awake-not in any distress.  Frail-chronically sick appearing. HEENT:atraumatic, normocephalic Chest: B/L clear to auscultation anteriorly CVS:S1S2 regular Abdomen:soft non tender, distended. Extremities:+ edema Neurology: Non focal Skin: no rash  Pertinent Labs/Radiology: CBC Latest Ref Rng & Units 01/30/2022 01/29/2022 01/08/2022  WBC 4.0 - 10.5 K/uL 11.8(H) 15.4(H) 9.0  Hemoglobin 12.0 - 15.0 g/dL  10.0(L) 10.9(L) 8.2(L)  Hematocrit 36.0 - 46.0 % 32.1(L) 35.1(L) 26.1(L)  Platelets 150 - 400 K/uL 155 176 333    Lab Results  Component Value Date   NA 134 (L) 01/30/2022   K 4.7 01/30/2022   CL 95 (L) 01/30/2022   CO2 25 01/30/2022      Assessment/Plan: SIRS due to MRSA bacteremia: SIRS physiology improving-continue vancomycin-TDC removed on 3/1-line holiday in progress-await echo.  ID following.  Follow repeat cultures done on 3/2.  Note-sepsis ruled out  ESRD on HD TTS: Nephrology following.  Longstanding history of under dialysis/noncompliance.  Normocytic anemia: Due to CKD-defer Aranesp/iron therapy to nephrology.  Chronic combined systolic and diastolic heart failure (EF 60-63%, grade 3 diastolic dysfunction by TTE on 2/6): Some mild volume overload-but otherwise stable.  Diuresis with HD.  Pericardial effusion: Appears to be a chronic issue-most recent echo on 2/6 showed effusion without any tamponade physiology.  Suspect related to under dialysis.  Pulmonary embolism: Remains on Eliquis.  HTN: BP stable-continue amlodipine, Coreg, clonidine  History of lupus: Appears stable-continue Plaquenil and prednisone  Elevated beta-hCG: Likely false positive elevation-Per patient-she has not had her menstrual period for close to a year-and she has not engage in sexual intercourse for "years".  We will repeat beta-hCG with morning labs.  Tobacco abuse: Encourage cessation-on transdermal nicotine.  Debility/deconditioning: Some of this is chronic-likely worsened by MRSA bacteremia.  Mobilize with rehab services and see how she does.  BMI: Estimated body mass index is 21.93 kg/m as calculated from the following:   Height as of this encounter: 5\' 7"  (1.702 m).   Weight as of this encounter: 63.5 kg.   Code status:  Code Status: Full Code   DVT Prophylaxis: apixaban (ELIQUIS) tablet 5 mg    Family Communication: None at bedside  Disposition Plan: Status is:  Inpatient Remains inpatient appropriate because: MRSA bacteremia-on IV vancomycin-work-up in progress-not yet stable for discharge.   Planned Discharge Destination:Home   Diet: Diet Order             Diet renal with fluid restriction Fluid restriction: 1200 mL Fluid; Room service appropriate? Yes; Fluid consistency: Thin  Diet effective now                     Antimicrobial agents: Anti-infectives (From admission, onward)    Start     Dose/Rate Route Frequency Ordered Stop   01/30/22 2200  ceFEPIme (MAXIPIME) 1 g in sodium chloride 0.9 % 100 mL IVPB  Status:  Discontinued        1 g 200 mL/hr over 30 Minutes Intravenous Every 24 hours 01/29/22 0606 01/29/22 1453   01/30/22 1200  vancomycin (VANCOCIN) 750 mg in sodium chloride 0.9 % 250 mL IVPB  Status:  Discontinued        750 mg 250 mL/hr over 60 Minutes Intravenous Every T-Th-Sa (Hemodialysis) 01/29/22 0606 01/30/22 0806   01/30/22 1000  hydroxychloroquine (PLAQUENIL) tablet 100 mg        100 mg Oral Every 48 hours 01/29/22 0904     01/30/22 0900  vancomycin (VANCOREADY) IVPB 750 mg/150 mL        750 mg 150 mL/hr over 60 Minutes Intravenous  Once 01/30/22 0806 01/30/22 1056   01/30/22 0806  vancomycin variable dose per unstable renal function (pharmacist dosing)         Does not apply See admin instructions 01/30/22 0806     01/29/22 0615  ceFEPIme (MAXIPIME) 2 g in sodium chloride 0.9 % 100 mL IVPB        2 g 200 mL/hr over 30 Minutes Intravenous  Once 01/29/22 0600 01/29/22 0722   01/29/22 0615  metroNIDAZOLE (FLAGYL) IVPB 500 mg        500 mg 100 mL/hr over 60 Minutes Intravenous  Once 01/29/22 0600 01/29/22 0903   01/29/22 0615  vancomycin (VANCOCIN) IVPB 1000 mg/200 mL premix  Status:  Discontinued        1,000 mg 200 mL/hr over 60 Minutes Intravenous  Once 01/29/22 0600 01/29/22 0604   01/29/22 0615  Vancomycin (VANCOCIN) 1,250 mg in sodium chloride 0.9 % 250 mL IVPB        1,250 mg 166.7 mL/hr over 90 Minutes  Intravenous  Once 01/29/22 0604 01/29/22 0934        MEDICATIONS: Scheduled Meds:  amLODipine  10 mg Oral Daily   apixaban  5 mg Oral BID   calcium acetate  1,334 mg Oral TID WC   carvedilol  12.5 mg Oral BID WC   Chlorhexidine Gluconate Cloth  6 each Topical Q0600   cloNIDine  0.3 mg Oral TID   colchicine  0.3 mg Oral Daily   hydrALAZINE  100 mg Oral TID   hydroxychloroquine  100 mg Oral Q48H   nicotine  14 mg Transdermal Daily   predniSONE  40 mg Oral Q breakfast   sodium chloride flush  3 mL Intravenous Q12H   sodium zirconium cyclosilicate  10 g Oral Daily   vancomycin variable dose per unstable renal function (pharmacist dosing)   Does not apply See admin instructions   Continuous Infusions: PRN Meds:.acetaminophen **OR** acetaminophen, albuterol, calcium carbonate (dosed  in mg elemental calcium), docusate sodium, feeding supplement (NEPRO CARB STEADY), HYDROmorphone (DILAUDID) injection, hydrOXYzine, methocarbamol, ondansetron **OR** ondansetron (ZOFRAN) IV, oxyCODONE, sorbitol, zolpidem   I have personally reviewed following labs and imaging studies  LABORATORY DATA: CBC: Recent Labs  Lab 01/29/22 0246 01/30/22 0432  WBC 15.4* 11.8*  NEUTROABS 14.2*  --   HGB 10.9* 10.0*  HCT 35.1* 32.1*  MCV 93.9 91.7  PLT 176 035    Basic Metabolic Panel: Recent Labs  Lab 01/29/22 0246 01/30/22 0432  NA 136 134*  K 5.9* 4.7  CL 96* 95*  CO2 22 25  GLUCOSE 93 89  BUN 85* 42*  CREATININE 9.33* 5.87*  CALCIUM 8.6* 7.4*    GFR: Estimated Creatinine Clearance: 12.6 mL/min (A) (by C-G formula based on SCr of 5.87 mg/dL (H)).  Liver Function Tests: Recent Labs  Lab 01/29/22 0246  AST 30  ALT 44  ALKPHOS 192*  BILITOT 0.9  PROT 6.2*  ALBUMIN 2.8*   No results for input(s): LIPASE, AMYLASE in the last 168 hours. No results for input(s): AMMONIA in the last 168 hours.  Coagulation Profile: Recent Labs  Lab 01/29/22 0246  INR 2.1*    Cardiac Enzymes: No  results for input(s): CKTOTAL, CKMB, CKMBINDEX, TROPONINI in the last 168 hours.  BNP (last 3 results) No results for input(s): PROBNP in the last 8760 hours.  Lipid Profile: No results for input(s): CHOL, HDL, LDLCALC, TRIG, CHOLHDL, LDLDIRECT in the last 72 hours.  Thyroid Function Tests: No results for input(s): TSH, T4TOTAL, FREET4, T3FREE, THYROIDAB in the last 72 hours.  Anemia Panel: No results for input(s): VITAMINB12, FOLATE, FERRITIN, TIBC, IRON, RETICCTPCT in the last 72 hours.  Urine analysis:    Component Value Date/Time   COLORURINE YELLOW 11/29/2020 2054   APPEARANCEUR HAZY (A) 11/29/2020 2054   LABSPEC 1.015 11/29/2020 2054   PHURINE 6.0 11/29/2020 2054   GLUCOSEU NEGATIVE 11/29/2020 2054   HGBUR MODERATE (A) 11/29/2020 2054   BILIRUBINUR NEGATIVE 11/29/2020 2054   KETONESUR NEGATIVE 11/29/2020 2054   PROTEINUR >=300 (A) 11/29/2020 2054   UROBILINOGEN 0.2 10/17/2020 1026   NITRITE NEGATIVE 11/29/2020 2054   LEUKOCYTESUR NEGATIVE 11/29/2020 2054    Sepsis Labs: Lactic Acid, Venous    Component Value Date/Time   LATICACIDVEN 3.1 (HH) 01/29/2022 0748    MICROBIOLOGY: Recent Results (from the past 240 hour(s))  Resp Panel by RT-PCR (Flu A&B, Covid) Nasopharyngeal Swab     Status: None   Collection Time: 01/29/22  2:18 AM   Specimen: Nasopharyngeal Swab; Nasopharyngeal(NP) swabs in vial transport medium  Result Value Ref Range Status   SARS Coronavirus 2 by RT PCR NEGATIVE NEGATIVE Final    Comment: (NOTE) SARS-CoV-2 target nucleic acids are NOT DETECTED.  The SARS-CoV-2 RNA is generally detectable in upper respiratory specimens during the acute phase of infection. The lowest concentration of SARS-CoV-2 viral copies this assay can detect is 138 copies/mL. A negative result does not preclude SARS-Cov-2 infection and should not be used as the sole basis for treatment or other patient management decisions. A negative result may occur with  improper  specimen collection/handling, submission of specimen other than nasopharyngeal swab, presence of viral mutation(s) within the areas targeted by this assay, and inadequate number of viral copies(<138 copies/mL). A negative result must be combined with clinical observations, patient history, and epidemiological information. The expected result is Negative.  Fact Sheet for Patients:  EntrepreneurPulse.com.au  Fact Sheet for Healthcare Providers:  IncredibleEmployment.be  This test is no  t yet approved or cleared by the [place] and  has been authorized for detection and/or diagnosis of SARS-CoV-2 by FDA under an Emergency Use Authorization (EUA). This EUA will remain  in effect (meaning this test can be used) for the duration of the COVID-19 declaration under Section 564(b)(1) of the Act, 21 U.S.C.section 360bbb-3(b)(1), unless the authorization is terminated  or revoked sooner.       Influenza A by PCR NEGATIVE NEGATIVE Final   Influenza B by PCR NEGATIVE NEGATIVE Final    Comment: (NOTE) The Xpert Xpress SARS-CoV-2/FLU/RSV plus assay is intended as an aid in the diagnosis of influenza from Nasopharyngeal swab specimens and should not be used as a sole basis for treatment. Nasal washings and aspirates are unacceptable for Xpert Xpress SARS-CoV-2/FLU/RSV testing.  Fact Sheet for Patients: EntrepreneurPulse.com.au  Fact Sheet for Healthcare Providers: IncredibleEmployment.be  This test is not yet approved or cleared by the [place] FDA and has been authorized for detection and/or diagnosis of SARS-CoV-2 by FDA under an Emergency Use Authorization (EUA). This EUA will remain in effect (meaning this test can be used) for the duration of the COVID-19 declaration under Section 564(b)(1) of the Act, 21 U.S.C. section 360bbb-3(b)(1), unless the authorization is terminated or revoked.  Performed at  [facility], [place] [address] [place], [place] [address]   Blood Culture (routine x 2)     Status: Abnormal (Preliminary result)   Collection Time: 01/29/22  2:30 AM   Specimen: BLOOD LEFT ARM  Result Value Ref Range Status   Specimen Description BLOOD LEFT ARM  Final   Special Requests   Final    BOTTLES DRAWN AEROBIC AND ANAEROBIC Blood Culture results may not be optimal due to an excessive volume of blood received in culture bottles   Culture  Setup Time   Final    GRAM POSITIVE COCCI IN CLUSTERS IN BOTH AEROBIC AND ANAEROBIC BOTTLES CRITICAL RESULT CALLED TO, READ BACK BY AND VERIFIED WITH: PHARMD M.WEISKOPH AT 1884 ON 01/29/2022 BY T.SAAD.    Culture (A)  Final    STAPHYLOCOCCUS AUREUS SUSCEPTIBILITIES TO FOLLOW Performed at [facility], [place] [address], [place], [place] [address]    Report Status PENDING  Incomplete  Blood Culture ID Panel (Reflexed)     Status: Abnormal   Collection Time: 01/29/22  2:30 AM  Result Value Ref Range Status   Enterococcus faecalis NOT DETECTED NOT DETECTED Final   Enterococcus Faecium NOT DETECTED NOT DETECTED Final   Listeria monocytogenes NOT DETECTED NOT DETECTED Final   Staphylococcus species DETECTED (A) NOT DETECTED Final    Comment: CRITICAL RESULT CALLED TO, READ BACK BY AND VERIFIED WITH: PHARMD M.WEISKOPH AT 1449 ON 01/29/2022 BY T.SAAD.    Staphylococcus aureus (BCID) DETECTED (A) NOT DETECTED Final    Comment: Methicillin (oxacillin)-resistant Staphylococcus aureus (MRSA). MRSA is predictably resistant to beta-lactam antibiotics (except ceftaroline). Preferred therapy is vancomycin unless clinically contraindicated. Patient requires contact precautions if  hospitalized. CRITICAL RESULT CALLED TO, READ BACK BY AND VERIFIED WITH: PHARMD M.WEISKOPH AT 1449 ON 01/29/2022 BY T.SAAD.    Staphylococcus epidermidis NOT DETECTED NOT DETECTED Final   Staphylococcus lugdunensis NOT DETECTED NOT DETECTED Final   Streptococcus  species NOT DETECTED NOT DETECTED Final   Streptococcus agalactiae NOT DETECTED NOT DETECTED Final   Streptococcus pneumoniae NOT DETECTED NOT DETECTED Final   Streptococcus pyogenes NOT DETECTED NOT DETECTED Final   A.calcoaceticus-baumannii NOT DETECTED NOT DETECTED Final   Bacteroides fragilis NOT  DETECTED NOT DETECTED Final   Enterobacterales NOT DETECTED NOT DETECTED Final   Enterobacter cloacae complex NOT DETECTED NOT DETECTED Final   Escherichia coli NOT DETECTED NOT DETECTED Final   Klebsiella aerogenes NOT DETECTED NOT DETECTED Final   Klebsiella oxytoca NOT DETECTED NOT DETECTED Final   Klebsiella pneumoniae NOT DETECTED NOT DETECTED Final   Proteus species NOT DETECTED NOT DETECTED Final   Salmonella species NOT DETECTED NOT DETECTED Final   Serratia marcescens NOT DETECTED NOT DETECTED Final   Haemophilus influenzae NOT DETECTED NOT DETECTED Final   Neisseria meningitidis NOT DETECTED NOT DETECTED Final   Pseudomonas aeruginosa NOT DETECTED NOT DETECTED Final   Stenotrophomonas maltophilia NOT DETECTED NOT DETECTED Final   Candida albicans NOT DETECTED NOT DETECTED Final   Candida auris NOT DETECTED NOT DETECTED Final   Candida glabrata NOT DETECTED NOT DETECTED Final   Candida krusei NOT DETECTED NOT DETECTED Final   Candida parapsilosis NOT DETECTED NOT DETECTED Final   Candida tropicalis NOT DETECTED NOT DETECTED Final   Cryptococcus neoformans/gattii NOT DETECTED NOT DETECTED Final   Meth resistant mecA/C and MREJ DETECTED (A) NOT DETECTED Final    Comment: CRITICAL RESULT CALLED TO, READ BACK BY AND VERIFIED WITH: PHARMD M.WEISKOPH AT 1449 ON 01/29/2022 BY T.SAAD. Performed at [facility], [place] [address] [place], [place] [address]   Blood Culture (routine x 2)     Status: Abnormal (Preliminary result)   Collection Time: 01/29/22  2:40 AM   Specimen: BLOOD LEFT HAND  Result Value Ref Range Status   Specimen Description BLOOD LEFT HAND  Final   Special  Requests   Final    BOTTLES DRAWN AEROBIC ONLY Blood Culture results may not be optimal due to an excessive volume of blood received in culture bottles   Culture  Setup Time   Final    GRAM POSITIVE COCCI IN CLUSTERS AEROBIC BOTTLE ONLY CRITICAL VALUE NOTED.  VALUE IS CONSISTENT WITH PREVIOUSLY REPORTED AND CALLED VALUE. Performed at [facility], [place] [address], [place], [place] [address]    Culture STAPHYLOCOCCUS AUREUS (A)  Final   Report Status PENDING  Incomplete  Culture, blood (single)     Status: None (Preliminary result)   Collection Time: 01/29/22  5:56 AM   Specimen: BLOOD  Result Value Ref Range Status   Specimen Description BLOOD CENTRAL LINE  Final   Special Requests   Final    BOTTLES DRAWN AEROBIC AND ANAEROBIC Blood Culture adequate volume   Culture  Setup Time   Final    GRAM POSITIVE COCCI IN CLUSTERS IN BOTH AEROBIC AND ANAEROBIC BOTTLES CRITICAL VALUE NOTED.  VALUE IS CONSISTENT WITH PREVIOUSLY REPORTED AND CALLED VALUE.    Culture   Final    NO GROWTH < 12 HOURS Performed at [facility], [place] [address] [place],  [address]    Report Status PENDING  Incomplete    RADIOLOGY STUDIES/RESULTS: DG Chest 2 View  Result Date: 01/29/2022 CLINICAL DATA:  Possible sepsis. EXAM: CHEST - 2 VIEW COMPARISON:  Chest radiograph dated 01/05/2022. FINDINGS: Dialysis catheter in similar position. Small bilateral pleural effusions with bibasilar atelectasis. No pneumothorax. Stable cardiomegaly. No acute osseous pathology. IMPRESSION: 1. Small bilateral pleural effusions with bibasilar atelectasis. 2. Cardiomegaly. Electronically Signed   By: Anner Crete M.D.   On: 01/29/2022 03:04   IR Removal Tun Cv Cath W/O FL  Result Date: 01/30/2022 INDICATION: Patient with end-stage renal disease on HD via right IJ tunneled  dialysis catheter placed 06/21/2021 by Dr. Pascal Lux. Patient admitted with line infection, bacteremia. Request made for tunneled dialysis catheter  removal EXAM: REMOVAL TUNNELED CENTRAL VENOUS CATHETER MEDICATIONS: 12 mL 1% lidocaine. ANESTHESIA/SEDATION: None FLUOROSCOPY: None COMPLICATIONS: None immediate. PROCEDURE: Informed written consent was obtained from the patient after a thorough discussion of the procedural risks, benefits and alternatives. All questions were addressed. Maximal Sterile Barrier Technique was utilized including caps, mask, sterile gowns, sterile gloves, sterile drape, hand hygiene and skin antiseptic. A timeout was performed prior to the initiation of the procedure. The patient's right chest and catheter was prepped and draped in a normal sterile fashion. Heparin was removed from both ports of catheter. 1% lidocaine was used for local anesthesia. Using gentle blunt dissection the cuff of the catheter was exposed and the catheter was removed in its entirety. Bloody pus flowed from tract upon removal of catheter. Pressure was held till hemostasis was obtained. A sterile dressing was applied. The patient tolerated the procedure well with no immediate complications. Tip was not sent for culture as it was not requested and patient has blood cultures pending. IMPRESSION: Successful catheter removal as described above. Read by: Brynda Greathouse PA-C Electronically Signed   By: Ruthann Cancer M.D.   On: 01/30/2022 07:35     LOS: 1 day   Oren Binet, MD  [facility]    To contact the attending provider between 7A-7P or the covering provider during after hours 7P-7A, please log into the web site [email or web address] and access using universal [place] password for that web site. If you do not have the password, please call the hospital operator.  01/30/2022, 11:19 AM

## 2022-01-30 NOTE — Progress Notes (Signed)
?  Transition of Care (TOC) Screening Note ? ? ?Patient Details  ?Name: Cathy [name] ?Date of Birth: 1981/01/21 ? ? ?Transition of Care (TOC) CM/SW Contact:    ?Cyndi Bender, RN ?Phone Number: ?01/30/2022, 9:42 AM ? ? ? ?Transition of Care Department [facility]) has reviewed patient and no TOC needs have been identified at this time. We will continue to monitor patient advancement through interdisciplinary progression rounds. If new patient transition needs arise, please place a TOC consult. ? ? ?

## 2022-01-30 NOTE — Progress Notes (Signed)
[facility] Infectious Diseases Follow Up Note  Patient Identification: Patient Name: Cathy [name] MRN: [id number] [place] Date: 01/29/2022  2:16 AM Age: 41 y.o.Today's Date: 01/30/2022   Reason for Visit: MRSA bacteremia  Principal Problem:   MRSA bacteremia Active Problems:   Renal hypertension   Lupus nephritis ([place])   ESRD on hemodialysis ([place])   Chronic combined systolic and diastolic congestive heart failure (HCC)   Acute pulmonary embolus (HCC)   Tobacco dependence  Antibiotics:  Cefepime and metronidazole 2/28 Vancomycin 3/1-c    Lines/Hardware: RT chest HD catheter ( removed )   Interval Events: Low-grade fever, right chest HD catheter was removed yesterday   Assessment High-grade MRSA bacteremia/CLABSI secondary to HD catheter (removed on 3/1) -No back pain, peripheral joints swelling or tenderness.  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  If negative will need TEE Monitor CBC, BMP and vancomycin trough Monitor for metastatic sites of infection   Rest of the management as per the primary team. Thank you for the consult. Please page with pertinent questions or concerns.  ______________________________________________________________________ Subjective patient seen and examined at the bedside.  Soreness at the site of removal of the HD catheter  Vitals BP 122/86 (BP Location: Left Arm)    Pulse 83    Temp 98.4 F (36.9 C) (Oral)    Resp 20    Ht 5\' 7"  (1.702 m)    Wt 63.5 kg    SpO2 96%    BMI 21.93 kg/m     Physical Exam Constitutional: Sitting up in the bed, appears tired    Comments:   Cardiovascular:     Rate  and Rhythm: Normal rate and regular rhythm.     Heart sounds: Systolic murmur  Pulmonary:     Effort: Pulmonary effort is normal on room air    Comments:   Abdominal:     Palpations: Abdomen is soft.     Tenderness: Nontender and nondistended  Musculoskeletal:        General: No swelling or tenderness of peripheral joints  Skin:    Comments: Right chest HD cath removal site with minimal soreness but no tenderness or fluctuance  Neurological:     General: Grossly nonfocal.  Awake alert and oriented  Psychiatric:        Mood and Affect: Mood normal.   Pertinent Microbiology Results for orders placed or performed during the hospital encounter of 01/29/22  Resp Panel by RT-PCR (Flu A&B, Covid) Nasopharyngeal Swab     Status: None   Collection Time: 01/29/22  2:18 AM   Specimen: Nasopharyngeal Swab; Nasopharyngeal(NP) swabs in vial transport medium  Result Value Ref Range Status   SARS Coronavirus 2 by RT PCR NEGATIVE NEGATIVE Final    Comment: (NOTE) SARS-CoV-2 target nucleic acids are NOT DETECTED.  The SARS-CoV-2 RNA is generally detectable in upper respiratory specimens during the acute phase of infection. The lowest concentration of SARS-CoV-2 viral copies this assay can detect is 138 copies/mL. A negative result does not preclude SARS-Cov-2 infection and should not be used as the sole basis for treatment or other patient management decisions. A negative result may occur with  improper specimen collection/handling, submission of specimen other than nasopharyngeal swab, presence of viral mutation(s) within the areas targeted  by this assay, and inadequate number of viral copies(<138 copies/mL). A negative result must be combined with clinical observations, patient history, and epidemiological information. The expected result is Negative.  Fact Sheet for Patients:  EntrepreneurPulse.com.au  Fact Sheet for Healthcare Providers:   IncredibleEmployment.be  This test is no t yet approved or cleared by the [place] FDA and  has been authorized for detection and/or diagnosis of SARS-CoV-2 by FDA under an Emergency Use Authorization (EUA). This EUA will remain  in effect (meaning this test can be used) for the duration of the COVID-19 declaration under Section 564(b)(1) of the Act, 21 U.S.C.section 360bbb-3(b)(1), unless the authorization is terminated  or revoked sooner.       Influenza A by PCR NEGATIVE NEGATIVE Final   Influenza B by PCR NEGATIVE NEGATIVE Final    Comment: (NOTE) The Xpert Xpress SARS-CoV-2/FLU/RSV plus assay is intended as an aid in the diagnosis of influenza from Nasopharyngeal swab specimens and should not be used as a sole basis for treatment. Nasal washings and aspirates are unacceptable for Xpert Xpress SARS-CoV-2/FLU/RSV testing.  Fact Sheet for Patients: EntrepreneurPulse.com.au  Fact Sheet for Healthcare Providers: IncredibleEmployment.be  This test is not yet approved or cleared by the [place] FDA and has been authorized for detection and/or diagnosis of SARS-CoV-2 by FDA under an Emergency Use Authorization (EUA). This EUA will remain in effect (meaning this test can be used) for the duration of the COVID-19 declaration under Section 564(b)(1) of the Act, 21 U.S.C. section 360bbb-3(b)(1), unless the authorization is terminated or revoked.  Performed at [facility], [place] [address], [place], [place] [address]   Blood Culture (routine x 2)     Status: None (Preliminary result)   Collection Time: 01/29/22  2:30 AM   Specimen: BLOOD LEFT ARM  Result Value Ref Range Status   Specimen Description BLOOD LEFT ARM  Final   Special Requests   Final    BOTTLES DRAWN AEROBIC AND ANAEROBIC Blood Culture results may not be optimal due to an excessive volume of blood received in culture bottles   Culture  Setup Time    Final    GRAM POSITIVE COCCI IN CLUSTERS IN BOTH AEROBIC AND ANAEROBIC BOTTLES CRITICAL RESULT CALLED TO, READ BACK BY AND VERIFIED WITH: PHARMD M.WEISKOPH AT 4008 ON 01/29/2022 BY T.SAAD. Performed at [facility], [place] [address] [place], [place] [address]    Culture GRAM POSITIVE COCCI  Final   Report Status PENDING  Incomplete  Blood Culture ID Panel (Reflexed)     Status: Abnormal   Collection Time: 01/29/22  2:30 AM  Result Value Ref Range Status   Enterococcus faecalis NOT DETECTED NOT DETECTED Final   Enterococcus Faecium NOT DETECTED NOT DETECTED Final   Listeria monocytogenes NOT DETECTED NOT DETECTED Final   Staphylococcus species DETECTED (A) NOT DETECTED Final    Comment: CRITICAL RESULT CALLED TO, READ BACK BY AND VERIFIED WITH: PHARMD M.WEISKOPH AT 1449 ON 01/29/2022 BY T.SAAD.    Staphylococcus aureus (BCID) DETECTED (A) NOT DETECTED Final    Comment: Methicillin (oxacillin)-resistant Staphylococcus aureus (MRSA). MRSA is predictably resistant to beta-lactam antibiotics (except ceftaroline). Preferred therapy is vancomycin unless clinically contraindicated. Patient requires contact precautions if  hospitalized. CRITICAL RESULT CALLED TO, READ BACK BY AND VERIFIED WITH: PHARMD M.WEISKOPH AT 1449 ON 01/29/2022 BY T.SAAD.    Staphylococcus epidermidis NOT DETECTED NOT DETECTED Final   Staphylococcus lugdunensis NOT DETECTED NOT DETECTED Final   Streptococcus species NOT DETECTED NOT  DETECTED Final   Streptococcus agalactiae NOT DETECTED NOT DETECTED Final   Streptococcus pneumoniae NOT DETECTED NOT DETECTED Final   Streptococcus pyogenes NOT DETECTED NOT DETECTED Final   A.calcoaceticus-baumannii NOT DETECTED NOT DETECTED Final   Bacteroides fragilis NOT DETECTED NOT DETECTED Final   Enterobacterales NOT DETECTED NOT DETECTED Final   Enterobacter cloacae complex NOT DETECTED NOT DETECTED Final   Escherichia coli NOT DETECTED NOT DETECTED Final   Klebsiella  aerogenes NOT DETECTED NOT DETECTED Final   Klebsiella oxytoca NOT DETECTED NOT DETECTED Final   Klebsiella pneumoniae NOT DETECTED NOT DETECTED Final   Proteus species NOT DETECTED NOT DETECTED Final   Salmonella species NOT DETECTED NOT DETECTED Final   Serratia marcescens NOT DETECTED NOT DETECTED Final   Haemophilus influenzae NOT DETECTED NOT DETECTED Final   Neisseria meningitidis NOT DETECTED NOT DETECTED Final   Pseudomonas aeruginosa NOT DETECTED NOT DETECTED Final   Stenotrophomonas maltophilia NOT DETECTED NOT DETECTED Final   Candida albicans NOT DETECTED NOT DETECTED Final   Candida auris NOT DETECTED NOT DETECTED Final   Candida glabrata NOT DETECTED NOT DETECTED Final   Candida krusei NOT DETECTED NOT DETECTED Final   Candida parapsilosis NOT DETECTED NOT DETECTED Final   Candida tropicalis NOT DETECTED NOT DETECTED Final   Cryptococcus neoformans/gattii NOT DETECTED NOT DETECTED Final   Meth resistant mecA/C and MREJ DETECTED (A) NOT DETECTED Final    Comment: CRITICAL RESULT CALLED TO, READ BACK BY AND VERIFIED WITH: PHARMD M.WEISKOPH AT 1449 ON 01/29/2022 BY T.SAAD. Performed at [facility], [place] [address], [place], [place] [address]   Blood Culture (routine x 2)     Status: None (Preliminary result)   Collection Time: 01/29/22  2:40 AM   Specimen: BLOOD LEFT HAND  Result Value Ref Range Status   Specimen Description BLOOD LEFT HAND  Final   Special Requests   Final    BOTTLES DRAWN AEROBIC ONLY Blood Culture results may not be optimal due to an excessive volume of blood received in culture bottles   Culture  Setup Time   Final    GRAM POSITIVE COCCI IN CLUSTERS AEROBIC BOTTLE ONLY CRITICAL VALUE NOTED.  VALUE IS CONSISTENT WITH PREVIOUSLY REPORTED AND CALLED VALUE. Performed at [facility], [place] [address] [place], [place] [address]    Culture GRAM POSITIVE COCCI  Final   Report Status PENDING  Incomplete  Culture, blood (single)     Status: None  (Preliminary result)   Collection Time: 01/29/22  5:56 AM   Specimen: BLOOD  Result Value Ref Range Status   Specimen Description BLOOD CENTRAL LINE  Final   Special Requests   Final    BOTTLES DRAWN AEROBIC AND ANAEROBIC Blood Culture adequate volume   Culture  Setup Time   Final    GRAM POSITIVE COCCI IN CLUSTERS IN BOTH AEROBIC AND ANAEROBIC BOTTLES CRITICAL VALUE NOTED.  VALUE IS CONSISTENT WITH PREVIOUSLY REPORTED AND CALLED VALUE.    Culture   Final    NO GROWTH < 12 HOURS Performed at [facility], [place] [address] [place], [place] [address]    Report Status PENDING  Incomplete    Pertinent Lab. CBC Latest Ref Rng & Units 01/30/2022 01/29/2022 01/08/2022  WBC 4.0 - 10.5 K/uL 11.8(H) 15.4(H) 9.0  Hemoglobin 12.0 - 15.0 g/dL 10.0(L) 10.9(L) 8.2(L)  Hematocrit 36.0 - 46.0 % 32.1(L) 35.1(L) 26.1(L)  Platelets 150 - 400 K/uL 155 176 333   CMP Latest Ref Rng &  Units 01/30/2022 01/29/2022 01/08/2022  Glucose 70 - 99 mg/dL 89 93 105(H)  BUN 6 - 20 mg/dL 42(H) 85(H) 35(H)  Creatinine 0.44 - 1.00 mg/dL 5.87(H) 9.33(H) 4.54(H)  Sodium 135 - 145 mmol/L 134(L) 136 135  Potassium 3.5 - 5.1 mmol/L 4.7 5.9(H) 4.3  Chloride 98 - 111 mmol/L 95(L) 96(L) 97(L)  CO2 22 - 32 mmol/L 25 22 25   Calcium 8.9 - 10.3 mg/dL 7.4(L) 8.6(L) 8.3(L)  Total Protein 6.5 - 8.1 g/dL - 6.2(L) -  Total Bilirubin 0.3 - 1.2 mg/dL - 0.9 -  Alkaline Phos 38 - 126 U/L - 192(H) -  AST 15 - 41 U/L - 30 -  ALT 0 - 44 U/L - 44 -     Pertinent Imaging today Plain films and CT images have been personally visualized and interpreted; radiology reports have been reviewed. Decision making incorporated into the Impression / Recommendations.  I spent more than 35 minutes for this patient encounter including review of prior medical records, coordination of care with primary/other specialist with greater than 50% of time being face to face/counseling and discussing diagnostics/treatment plan with the  patient/family.  Electronically signed by:   Rosiland Oz, MD Infectious Disease Physician [facility] for Infectious Disease Pager: [phone]

## 2022-01-30 NOTE — Progress Notes (Signed)
Pt arrives to the unit via stretcher. Oral membranes are pink and moist.clear lung sounds noted. Respirations are even and non-labored.S1 and S2 noted.  Peripheral pulses are +2. Non-pitting edema to BILAT lower extremiites noted. Abdomen is soft, distended, and tender to palpation. Bowel sounds are active. Skin is clean, dry, and intact. Pt is able to move all extremities wtihout difficulty. Bed in lowest position. Call light within reach. ?

## 2022-01-30 NOTE — Progress Notes (Signed)
Pharmacy Antibiotic Note ? ?Cathy [name] is a 41 y.o. 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   Pharmacy has been consulted for Vancomycin dosing. Patient is ESRD with normal HD days TThSa. Last HD session 3/1 and catheter was removed post HD.  ? ?Plan: ?Vancomycin 750 mg x 1 this morning for post HD dose from yesterday's session  ?Monitor plans for line holiday/replacement of line for further HD sessions and vancomycin doses ?Monitor blood cultures  ? ? ?Height: 5\' 7"  (170.2 cm) ?Weight: 63.5 kg (139 lb 15.9 oz) ?IBW/kg (Calculated) : 61.6 ? ?Temp (24hrs), Avg:98.8 ?F (37.1 ?C), Min:97.6 ?F (36.4 ?C), Max:100.1 ?F (37.8 ?C) ? ?Recent Labs  ?Lab 01/29/22 ?0246 01/29/22 ?1962 01/30/22 ?2297  ?WBC 15.4*  --  11.8*  ?CREATININE 9.33*  --  5.87*  ?LATICACIDVEN 3.0* 3.1*  --   ? ?  ?Estimated Creatinine Clearance: 12.6 mL/min (A) (by C-G formula based on SCr of 5.87 mg/dL (H)).   ? ?No Known Allergies ? ?Jimmy Footman, PharmD, BCPS, BCIDP ?Infectious Diseases Clinical Pharmacist ?Phone: [phone] ?01/30/2022 8:28 AM ? ? ? ?

## 2022-01-30 NOTE — Progress Notes (Signed)
Echocardiogram ?2D Echocardiogram has been performed. ? ?Arlyss Gandy ?01/30/2022, 11:44 AM ?

## 2022-01-31 ENCOUNTER — Encounter (HOSPITAL_COMMUNITY): Payer: Self-pay | Admitting: Family Medicine

## 2022-01-31 DIAGNOSIS — R7881 Bacteremia: Secondary | ICD-10-CM | POA: Diagnosis not present

## 2022-01-31 DIAGNOSIS — I2699 Other pulmonary embolism without acute cor pulmonale: Secondary | ICD-10-CM

## 2022-01-31 DIAGNOSIS — N186 End stage renal disease: Secondary | ICD-10-CM | POA: Diagnosis not present

## 2022-01-31 DIAGNOSIS — M3214 Glomerular disease in systemic lupus erythematosus: Secondary | ICD-10-CM | POA: Diagnosis not present

## 2022-01-31 HISTORY — DX: Other pulmonary embolism without acute cor pulmonale: I26.99

## 2022-01-31 LAB — CULTURE, BLOOD (SINGLE): Special Requests: ADEQUATE

## 2022-01-31 LAB — CBC
HCT: 29.8 % — ABNORMAL LOW (ref 36.0–46.0)
Hemoglobin: 9.3 g/dL — ABNORMAL LOW (ref 12.0–15.0)
MCH: 28.6 pg (ref 26.0–34.0)
MCHC: 31.2 g/dL (ref 30.0–36.0)
MCV: 91.7 fL (ref 80.0–100.0)
Platelets: 136 10*3/uL — ABNORMAL LOW (ref 150–400)
RBC: 3.25 MIL/uL — ABNORMAL LOW (ref 3.87–5.11)
RDW: 21.6 % — ABNORMAL HIGH (ref 11.5–15.5)
WBC: 13.2 10*3/uL — ABNORMAL HIGH (ref 4.0–10.5)
nRBC: 0 % (ref 0.0–0.2)

## 2022-01-31 LAB — RENAL FUNCTION PANEL
Albumin: 2.5 g/dL — ABNORMAL LOW (ref 3.5–5.0)
Anion gap: 14 (ref 5–15)
BUN: 58 mg/dL — ABNORMAL HIGH (ref 6–20)
CO2: 23 mmol/L (ref 22–32)
Calcium: 7.7 mg/dL — ABNORMAL LOW (ref 8.9–10.3)
Chloride: 93 mmol/L — ABNORMAL LOW (ref 98–111)
Creatinine, Ser: 6.91 mg/dL — ABNORMAL HIGH (ref 0.44–1.00)
GFR, Estimated: 7 mL/min — ABNORMAL LOW (ref >60)
Glucose, Bld: 118 mg/dL — ABNORMAL HIGH (ref 70–99)
Phosphorus: 5.1 mg/dL — ABNORMAL HIGH (ref 2.5–4.6)
Potassium: 4.9 mmol/L (ref 3.5–5.1)
Sodium: 130 mmol/L — ABNORMAL LOW (ref 135–145)

## 2022-01-31 LAB — CULTURE, BLOOD (ROUTINE X 2)

## 2022-01-31 LAB — HCG, QUANTITATIVE, PREGNANCY: hCG, Beta Chain, Quant, S: 3 m[IU]/mL (ref <5)

## 2022-01-31 MED ORDER — CEFAZOLIN SODIUM-DEXTROSE 2-4 GM/100ML-% IV SOLN
2.0000 g | INTRAVENOUS | Status: AC
  Administered 2022-02-01: 2 g via INTRAVENOUS
  Filled 2022-01-31: qty 100

## 2022-01-31 NOTE — Progress Notes (Signed)
ID Brief Note  ? ?Remains afebrile, WBC 13.2 ?Blood cx on 3/2 after HD catheter removal no growth in 1 day  ? ?TTE 01/30/22 ?Appearance of the myocardium is concerning for possible infiltrative cardiomypathy - consider further work-up for amyloid ?MV and TV abnormal , AV sclerosis ?Valve thickening.  ? ?TEE planned for 3/6 ?New TDC cather to be placed if blood cultures on 3/2 are negative in 48 hrs  ?Vascular evaluating for permanent access placement ? ?Continue Vancomycin, pharmacy to dose ?Fu repeat blood cultures  ?Monitor CBC, BMP and Vancomycin trough ? ?Dr Tommy Medal available this weekend with questions ? ?Rosiland Oz, MD ?Infectious Disease Physician ?[facility] ?[place] [address] ?[place], [place] [address] ?Phone: [phone]  Fax: [phone] ? ?

## 2022-01-31 NOTE — Progress Notes (Addendum)
Subjective: Seen in room has no complaints, last HD 3/01, Wednesday.  Today does say she is ready had an access in her arm(per patient "in  past was to have left arm access" she kept putting it off) noted multiple cups at bedside reminded of fluid restriction.  Objective Vital signs in last 24 hours: Vitals:   01/30/22 2004 01/30/22 2303 01/31/22 0307 01/31/22 0750  BP: 111/88 118/86 117/87 (!) 128/96  Pulse: (!) 55 (!) 59 (!) 57 62  Resp: 18 18 15 18   Temp: 97.7 F (36.5 C) 97.8 F (36.6 C) 97.9 F (36.6 C) (!) 97.4 F (36.3 C)  TempSrc: Oral Oral Oral Oral  SpO2: 99% 97% 97% 97%  Weight:      Height:       Weight change:   Physical Exam: General: Alert young female NAD Heart: RRR, no MRG  Lungs: CTA nonlabored breathing Abdomen: NABS, mild ascites, NT, soft Extremities: Trace bipedal lower extremity dialysis Access: TDC was removed 3/01/line holiday  Dialysis Orders: Dialyzes at [facility] TTS 4 hrs EDW 52 kg 2K/ 2 Ca bath BFR 400 via TDC, DFR 800 Hectorol 3 q rx Venofer 100 q rx x 10, ending 02/08/22 Mircera 225 q 2 weeks, last given 01/25/22   Problem/Plan: 1 SIRS due to MRSA bacteremia sepsis-  TDC removed by IR 3/1-after HD now on line holiday. Repeat BC X 2 recheck 3/01  ECHO yesterday 2 ESRD: TTS HD, last HD 01/30/19 , K4.9 CR 6.91, stable follow volume status and labs.  Patient agrees for arm access now, will notify [facility] about access when they are able, repeat [facility] -3/01 so far / will have IR place [facility] cath  hopefully tomorrow as dw Dr Hollie Salk  then hd tomor  3 Hypertension/volume-BP has improved since UF on HD, weights are not corresponding to HD, 3 L UF 3/01 HD no change in post weight 63.5, chest x-ray 3/01Small bilateral pleural effusions with bibasilar atelectasis  4. Anemia of ESRD: A.m. Hgb 9.3 last ESA 2/25 on max mircera as OP, hold Venofer  with bacteremia  5.  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  Lupus; on pred and plaquenil 7.   Dispo: inpatient.  Ernest Haber, PA-C [facility] [phone] 01/31/2022,10:11 AM  LOS: 2 days   Labs: Basic Metabolic Panel: Recent Labs  Lab 01/29/22 0246 01/30/22 0432 01/31/22 0202  NA 136 134* 130*  K 5.9* 4.7 4.9  CL 96* 95* 93*  CO2 22 25 23   GLUCOSE 93 89 118*  BUN 85* 42* 58*  CREATININE 9.33* 5.87* 6.91*  CALCIUM 8.6* 7.4* 7.7*  PHOS  --   --  5.1*   Liver Function Tests: Recent Labs  Lab 01/29/22 0246 01/31/22 0202  AST 30  --   ALT 44  --   ALKPHOS 192*  --   BILITOT 0.9  --   PROT 6.2*  --   ALBUMIN 2.8* 2.5*   No results for input(s): LIPASE, AMYLASE in the last 168 hours. No results for input(s): AMMONIA in the last 168 hours. CBC: Recent Labs  Lab 01/29/22 0246 01/30/22 0432 01/31/22 0202  WBC 15.4* 11.8* 13.2*  NEUTROABS 14.2*  --   --   HGB 10.9* 10.0* 9.3*  HCT 35.1* 32.1* 29.8*  MCV 93.9 91.7 91.7  PLT 176 155 136*   Cardiac Enzymes: No results for input(s): CKTOTAL, CKMB, CKMBINDEX, TROPONINI in the last 168 hours. CBG: No results for input(s): GLUCAP in the  last 168 hours.  Studies/Results: IR Removal Tun Cv Cath W/O FL  Result Date: 01/30/2022 INDICATION: Patient with end-stage renal disease on HD via right IJ tunneled dialysis catheter placed 06/21/2021 by Dr. Pascal Lux. Patient admitted with line infection, bacteremia. Request made for tunneled dialysis catheter removal EXAM: REMOVAL TUNNELED CENTRAL VENOUS CATHETER MEDICATIONS: 12 mL 1% lidocaine. ANESTHESIA/SEDATION: None FLUOROSCOPY: None COMPLICATIONS: None immediate. PROCEDURE: Informed written consent was obtained from the patient after a thorough discussion of the procedural risks, benefits and alternatives. All questions were addressed. Maximal Sterile Barrier Technique was utilized including caps, mask, sterile gowns, sterile gloves, sterile drape, hand hygiene and skin antiseptic. A timeout was performed prior to the initiation of the procedure. The patient's  right chest and catheter was prepped and draped in a normal sterile fashion. Heparin was removed from both ports of catheter. 1% lidocaine was used for local anesthesia. Using gentle blunt dissection the cuff of the catheter was exposed and the catheter was removed in its entirety. Bloody pus flowed from tract upon removal of catheter. Pressure was held till hemostasis was obtained. A sterile dressing was applied. The patient tolerated the procedure well with no immediate complications. Tip was not sent for culture as it was not requested and patient has blood cultures pending. IMPRESSION: Successful catheter removal as described above. Read by: Brynda Greathouse PA-C Electronically Signed   By: Ruthann Cancer M.D.   On: 01/30/2022 07:35   ECHOCARDIOGRAM COMPLETE  Result Date: 01/30/2022    ECHOCARDIOGRAM REPORT   Patient Name:   SHENG PRITZ Date of Exam: 01/30/2022 Medical Rec #:  [id number]        Height:       67.0 in Accession #:    [id number]       Weight:       140.0 lb Date of Birth:  25-Nov-1981       BSA:          1.738 m Patient Age:    41 years         BP:           117/91 mmHg Patient Gender: F                HR:           74 bpm. Exam Location:  Inpatient Procedure: 2D Echo Indications:    Bacteremia  History:        Patient has prior history of Echocardiogram examinations, most                 recent 01/06/2022. Pericardial Disease; Signs/Symptoms:Shortness                 of Breath.  Sonographer:    Arlyss Gandy Referring Phys: [id number] [place]  1. Left ventricular ejection fraction, by estimation, is 35 to 40%. The left ventricle has mildly decreased function. The left ventricle demonstrates global hypokinesis. There is moderate concentric left ventricular hypertrophy. The appearance of the myocardium is concerning for possible infiltrative cardiomypathy - consider further work-up for amyloid. Left ventricular diastolic parameters are consistent with Grade III diastolic  dysfunction (restrictive). Elevated left ventricular end-diastolic pressure. The E/e' is 47.  2. Right ventricular systolic function is mildly reduced. The right ventricular size is normal. There is normal pulmonary artery systolic pressure. The estimated right ventricular systolic pressure is 17.4 mmHg.  3. Left atrial size was mildly dilated.  4. Right atrial size was moderately dilated.  5. The pericardial effusion is  posterior to the left ventricle.  6. The mitral valve is abnormal. Mild mitral valve regurgitation.  7. The tricuspid valve is abnormal. Tricuspid valve regurgitation is moderate to severe.  8. The aortic valve is tricuspid. Aortic valve regurgitation is trivial. Aortic valve sclerosis is present, with no evidence of aortic valve stenosis.  9. Mildly dilated pulmonary artery. Comparison(s): Changes from prior study are noted. 01/06/2022: LVEF 20-25%. Conclusion(s)/Recommendation(s): Although there is valve thickening, there is No evidence of valvular vegetation on this transthoracic echocardiogram. Consider a transesophageal echocardiogram to exclude infective endocarditis if clinically indicated. FINDINGS  Left Ventricle: Left ventricular ejection fraction, by estimation, is 35 to 40%. The left ventricle has moderately decreased function. The left ventricle demonstrates global hypokinesis. The left ventricular internal cavity size was normal in size. There is moderate concentric left ventricular hypertrophy. Left ventricular diastolic parameters are consistent with Grade III diastolic dysfunction (restrictive). Elevated left ventricular end-diastolic pressure. The E/e' is 41. Right Ventricle: The right ventricular size is normal. No increase in right ventricular wall thickness. Right ventricular systolic function is mildly reduced. There is normal pulmonary artery systolic pressure. The tricuspid regurgitant velocity is 1.16 m/s, and with an assumed right atrial pressure of 15 mmHg, the estimated  right ventricular systolic pressure is 54.5 mmHg. Left Atrium: Left atrial size was mildly dilated. Right Atrium: Right atrial size was moderately dilated. Pericardium: Trivial pericardial effusion is present. The pericardial effusion is posterior to the left ventricle. Mitral Valve: The mitral valve is abnormal. There is moderate thickening of the anterior and posterior mitral valve leaflet(s). Mild mitral valve regurgitation. Tricuspid Valve: The tricuspid valve is abnormal. Tricuspid valve regurgitation is moderate to severe. Aortic Valve: The aortic valve is tricuspid. Aortic valve regurgitation is trivial. Aortic valve sclerosis is present, with no evidence of aortic valve stenosis. Aortic valve mean gradient measures 4.0 mmHg. Aortic valve peak gradient measures 8.0 mmHg. Aortic valve area, by VTI measures 2.51 cm. Pulmonic Valve: The pulmonic valve was grossly normal. Pulmonic valve regurgitation is mild. Aorta: The aortic root and ascending aorta are structurally normal, with no evidence of dilitation. Pulmonary Artery: The pulmonary artery is mildly dilated. IAS/Shunts: There is left bowing of the interatrial septum, suggestive of elevated right atrial pressure. No atrial level shunt detected by color flow Doppler.  LEFT VENTRICLE PLAX 2D LVIDd:         4.80 cm   Diastology LVIDs:         4.00 cm   LV e' medial:    4.57 cm/s LV PW:         1.20 cm   LV E/e' medial:  33.5 LV IVS:        1.20 cm   LV e' lateral:   6.31 cm/s LVOT diam:     2.00 cm   LV E/e' lateral: 24.2 LV SV:         61 LV SV Index:   35 LVOT Area:     3.14 cm  RIGHT VENTRICLE            IVC RV Basal diam:  4.40 cm    IVC diam: 3.10 cm RV Mid diam:    3.50 cm RV S prime:     7.29 cm/s TAPSE (M-mode): 1.1 cm LEFT ATRIUM           Index        RIGHT ATRIUM           Index LA diam:  3.80 cm 2.19 cm/m   RA Area:     23.40 cm LA Vol (A2C): 63.4 ml 36.48 ml/m  RA Volume:   73.20 ml  42.12 ml/m LA Vol (A4C): 37.2 ml 21.41 ml/m  AORTIC  VALVE AV Area (Vmax):    2.72 cm AV Area (Vmean):   2.53 cm AV Area (VTI):     2.51 cm AV Vmax:           141.00 cm/s AV Vmean:          93.000 cm/s AV VTI:            0.243 m AV Peak Grad:      8.0 mmHg AV Mean Grad:      4.0 mmHg LVOT Vmax:         122.00 cm/s LVOT Vmean:        74.900 cm/s LVOT VTI:          0.194 m LVOT/AV VTI ratio: 0.80  AORTA Ao Root diam: 2.80 cm Ao Asc diam:  2.80 cm MITRAL VALVE                TRICUSPID VALVE MV Area (PHT): 5.84 cm     TR Peak grad:   5.4 mmHg MV Decel Time: 130 msec     TR Vmax:        116.00 cm/s MV E velocity: 153.00 cm/s MV A velocity: 50.10 cm/s   SHUNTS MV E/A ratio:  3.05         Systemic VTI:  0.19 m                             Systemic Diam: 2.00 cm Lyman Bishop MD Electronically signed by Lyman Bishop MD Signature Date/Time: 01/30/2022/1:12:26 PM    Final    Medications:   amLODipine  10 mg Oral Daily   apixaban  5 mg Oral BID   calcium acetate  1,334 mg Oral TID WC   carvedilol  12.5 mg Oral BID WC   Chlorhexidine Gluconate Cloth  6 each Topical Q0600   cloNIDine  0.3 mg Oral TID   colchicine  0.3 mg Oral Daily   hydrALAZINE  100 mg Oral TID   hydroxychloroquine  100 mg Oral Q48H   nicotine  14 mg Transdermal Daily   predniSONE  40 mg Oral Q breakfast   sodium chloride flush  3 mL Intravenous Q12H   sodium zirconium cyclosilicate  10 g Oral Daily   vancomycin variable dose per unstable renal function (pharmacist dosing)   Does not apply See admin instructions

## 2022-01-31 NOTE — Consult Note (Addendum)
Hospital Consult    Reason for Consult:  dialysis access Requesting Physician:  Birdie Riddle MRN #:  [id number]  History of Present Illness: This is a 41 y.o. female with ESRD in need of dialysis access.  She was seen in our office by Dr. Scot Dock in July 2022.  He felt at that time she would probably need a left arm AVG but would look with u/s in the OR.  She did not have access placed.    She was hospitalized a couple of days ago with fever and chills.  She had sepsis and it was felt this was due to [facility] and IR removed this for line holiday.  They will be replacing TDC.  [facility] is asked to consult for permanent dialysis access.    She states that she is on Eliquis for blood clots in her lungs that she was found to have about a month ago.  She has a hx of lupus nephritis, HTN, HLD and recent PE.  She dialyzes T/T/S at [facility] location.  The pt is not on a statin for cholesterol management.  The pt is not on a daily aspirin.   Other AC:  Eliquis (last dose today) The pt is on CCB, BB for hypertension.   The pt is not diabetic.     Past Medical History:  Diagnosis Date   Anasarca associated with disorder of kidney 06/08/2021   Anxiety    Asthma    Bipolar depression ([place])    Depression    Dyspnea    ESRD (end stage renal disease) on dialysis ([place]) 10/2020   GERD (gastroesophageal reflux disease)    Gonorrhea    Lupus ([facility])    Pelvic inflammatory disease (PID)    Pleural effusion 06/08/2021   Renal hypertension    Trichomonas infection     Past Surgical History:  Procedure Laterality Date   IR FLUORO GUIDE CV LINE RIGHT  11/30/2020   IR FLUORO GUIDE CV LINE RIGHT  06/13/2021   IR REMOVAL TUN CV CATH W/O FL  06/11/2021   IR REMOVAL TUN CV CATH W/O FL  01/29/2022   IR US GUIDE VASC ACCESS RIGHT  11/30/2020   IR US GUIDE VASC ACCESS RIGHT  06/13/2021   RENAL BIOPSY     TEE WITHOUT CARDIOVERSION N/A 06/13/2021   Procedure: TRANSESOPHAGEAL ECHOCARDIOGRAM (TEE);  Surgeon: Josue Hector, MD;  Location: [facility];  Service: Cardiovascular;  Laterality: N/A;   TUBAL LIGATION N/A 02/03/2019   Procedure: POST PARTUM TUBAL LIGATION;  Surgeon: Aletha Halim, MD;  Location: [facility] LD ORS;  Service: Gynecology;  Laterality: N/A;    No Known Allergies  Prior to Admission medications   Medication Sig Start Date End Date Taking? Authorizing Provider  acetaminophen (TYLENOL) 500 MG tablet Take 1,000 mg by mouth every 6 (six) hours as needed for moderate pain.   Yes [provider]  albuterol (VENTOLIN HFA) 108 (90 Base) MCG/ACT inhaler Inhale 2 puffs into the lungs every 6 (six) hours as needed for wheezing or shortness of breath. 12/17/21 12/17/22 Yes Rai, Ripudeep K, MD  amLODipine (NORVASC) 10 MG tablet Take 1 tablet (10 mg total) by mouth daily. 01/09/22  Yes Shelly Coss, MD  apixaban (ELIQUIS) 5 MG TABS tablet Take 1 tablet (5 mg total) by mouth 2 (two) times daily. 01/15/22  Yes Shelly Coss, MD  calcium acetate (PHOSLO) 667 MG capsule Take 2 capsules (1,334 mg total) by mouth 3 (three) times daily with meals. 01/09/22  Yes  Shelly Coss, MD  cloNIDine (CATAPRES) 0.3 MG tablet Take 1 tablet (0.3 mg total) by mouth 3 (three) times daily. 01/09/22  Yes Shelly Coss, MD  colchicine 0.6 MG tablet Take 0.5 tablets (0.3 mg total) by mouth daily. 12/31/21  Yes Arrien, Jimmy Picket, MD  guaiFENesin (MUCINEX) 600 MG 12 hr tablet Take 600 mg by mouth 2 (two) times daily as needed for cough.   Yes [provider]  hydrALAZINE (APRESOLINE) 100 MG tablet Take 1 tablet (100 mg total) by mouth 3 (three) times daily. 01/09/22  Yes Shelly Coss, MD  hydroxychloroquine (PLAQUENIL) 200 MG tablet Take 1/2 tablet (100 mg total) by mouth every other day after hemodialysis 01/09/22  Yes Shelly Coss, MD  hydrOXYzine (ATARAX) 25 MG tablet Take 1 tablet (25 mg total) by mouth 3 (three) times daily as needed for itching or anxiety. 12/17/21  Yes Rai, Ripudeep K, MD  ipratropium  (ATROVENT HFA) 17 MCG/ACT inhaler Inhale 2 puffs into the lungs every 6 (six) hours as needed for wheezing. 12/17/21 12/17/22 Yes Rai, Ripudeep K, MD  loratadine (CLARITIN) 10 MG tablet Take 10 mg by mouth daily.   Yes [provider]  predniSONE (DELTASONE) 20 MG tablet Take 2 tablets (40 mg total) by mouth daily with breakfast. 12/31/21 04/05/22 Yes Arrien, Jimmy Picket, MD  torsemide (DEMADEX) 20 MG tablet Take 1 tablet (20 mg total) by mouth 2 (two) times daily. 01/09/22  Yes Shelly Coss, MD  Blood Pressure Monitoring (OMRON 3 SERIES BP MONITOR) DEVI Use as directed 2 times a day 01/09/22   Shelly Coss, MD  carvedilol (COREG) 12.5 MG tablet Take 1 tablet (12.5 mg total) by mouth 2 (two) times daily with a meal. Patient not taking: Reported on 01/31/2022 01/09/22   Shelly Coss, MD  methocarbamol (ROBAXIN) 500 MG tablet Take 1 tablet (500 mg total) by mouth every 6 (six) hours as needed for muscle spasms. Patient not taking: Reported on 01/31/2022 12/17/21   Rai, Vernelle Emerald, MD  nicotine (NICODERM CQ - DOSED IN MG/24 HOURS) 21 mg/24hr patch Place 1 patch (21 mg total) onto the skin daily. Patient not taking: Reported on 01/31/2022 01/10/22   Shelly Coss, MD  oxyCODONE (OXY IR/ROXICODONE) 5 MG immediate release tablet Take 1 tablet (5 mg total) by mouth every 6 (six) hours as needed for severe pain. Patient not taking: Reported on 01/31/2022 12/30/21   Arrien, Jimmy Picket, MD  fenofibrate 160 MG tablet Take 1 tablet (160 mg total) by mouth daily. 12/31/20 01/20/21  Elmarie Shiley, MD    Social History   Socioeconomic History   Marital status: Single    Spouse name: Not on file   Number of children: Not on file   Years of education: Not on file   Highest education level: Not on file  Occupational History   Occupation: unemployed  Tobacco Use   Smoking status: Some Days    Packs/day: 0.25    Types: Cigarettes    Last attempt to quit: 10/08/2020    Years since quitting: 1.3    Smokeless tobacco: Never  Vaping Use   Vaping Use: Never used  Substance and Sexual Activity   Alcohol use: Not Currently   Drug use: Not Currently    Types: Marijuana    Comment: 3 times a week    Sexual activity: Not Currently    Birth control/protection: None  Other Topics Concern   Not on file  Social History Narrative   Not on file  Social Determinants of Health   Financial Resource Strain: Not on file  Food Insecurity: Not on file  Transportation Needs: Not on file  Physical Activity: Not on file  Stress: Not on file  Social Connections: Not on file  Intimate Partner Violence: Not on file     Family History  Problem Relation Age of Onset   Diabetes Maternal Grandmother    Hypertension Maternal Grandmother    Diabetes Maternal Grandfather    Hypertension Maternal Grandfather    Diabetes Paternal Grandmother    Hypertension Paternal Grandmother    Diabetes Paternal Grandfather    Hypertension Paternal Grandfather     ROS: [x]  Positive   [ ]  Negative   [ ]  All sytems reviewed and are negative  Cardiac: [x]  high blood pressure   Vascular: []  denies pain in feet []  non-healing ulcers []  hx of DVT-denies  Pulmonary: [x]  recent PE  Neurologic: []  hx of CVA []  mini stroke   Hematologic: []  hx of cancer  Endocrine:   []  diabetes []  thyroid disease  GI []  GERD  GU: [x]  CKD/renal failure [x]  HD--[]  M/W/F or []  T/T/S  Psychiatric: []  anxiety []  depression  Musculoskeletal: []  arthritis []  joint pain  Integumentary: []  rashes []  ulcers  Constitutional: [x]  fever  [x]  chills   Physical Examination  Vitals:   01/31/22 0750 01/31/22 1155  BP: (!) 128/96 108/72  Pulse: 62 60  Resp: 18 18  Temp: (!) 97.4 F (36.3 C) 98 F (36.7 C)  SpO2: 97% 96%   Body mass index is 21.93 kg/m.  General:  WDWN in NAD Gait: Not observed HENT: WNL, normocephalic Pulmonary: normal non-labored breathing Cardiac: regular Skin: without  rashes Vascular Exam/Pulses: Bilateral radial and DP pulses are easily palpable Extremities: without ischemic changes, without Gangrene , without cellulitis; without open wounds Musculoskeletal: no muscle wasting or atrophy  Neurologic: A&O X 3; speech is fluent/normal Psychiatric:  The pt has Normal affect.   CBC    Component Value Date/Time   WBC 13.2 (H) 01/31/2022 0202   RBC 3.25 (L) 01/31/2022 0202   HGB 9.3 (L) 01/31/2022 0202   HGB 6.6 (L) 12/04/2020 1158   HCT 29.8 (L) 01/31/2022 0202   HCT 21.0 (L) 01/12/2019 0904   PLT 136 (L) 01/31/2022 0202   PLT 228 01/12/2019 0904   MCV 91.7 01/31/2022 0202   MCV 92 01/12/2019 0904   MCH 28.6 01/31/2022 0202   MCHC 31.2 01/31/2022 0202   RDW 21.6 (H) 01/31/2022 0202   RDW 13.9 01/12/2019 0904   LYMPHSABS 0.4 (L) 01/29/2022 0246   LYMPHSABS 0.7 10/04/2018 1131   MONOABS 0.5 01/29/2022 0246   EOSABS 0.0 01/29/2022 0246   EOSABS 0.0 10/04/2018 1131   BASOSABS 0.0 01/29/2022 0246   BASOSABS 0.0 10/04/2018 1131    BMET    Component Value Date/Time   NA 130 (L) 01/31/2022 0202   NA 136 12/10/2018 1142   K 4.9 01/31/2022 0202   CL 93 (L) 01/31/2022 0202   CO2 23 01/31/2022 0202   GLUCOSE 118 (H) 01/31/2022 0202   BUN 58 (H) 01/31/2022 0202   BUN 9 12/10/2018 1142   CREATININE 6.91 (H) 01/31/2022 0202   CALCIUM 7.7 (L) 01/31/2022 0202   GFRNONAA 7 (L) 01/31/2022 0202   GFRAA >60 08/07/2020 1209    COAGS: Lab Results  Component Value Date   INR 2.1 (H) 01/29/2022   INR 1.2 12/27/2021   INR 1.2 12/13/2021     Non-Invasive Vascular Imaging:  BUE Vein Mapping on 06/05/2021: +-----------------+-------------+----------+--------+   Right Cephalic    Diameter (cm) Depth (cm) Findings   +-----------------+-------------+----------+--------+   Shoulder              0.25                            +-----------------+-------------+----------+--------+   Prox upper arm        0.21                             +-----------------+-------------+----------+--------+   Mid upper arm         0.19                            +-----------------+-------------+----------+--------+   Dist upper arm        0.18                            +-----------------+-------------+----------+--------+   Antecubital fossa     0.22                            +-----------------+-------------+----------+--------+   Prox forearm                               thrombus   +-----------------+-------------+----------+--------+   Mid forearm                                thrombus   +-----------------+-------------+----------+--------+   Dist forearm                               thrombus   +-----------------+-------------+----------+--------+   +-----------------+-------------+----------+--------+   Right Basilic     Diameter (cm) Depth (cm) Findings   +-----------------+-------------+----------+--------+   Mid upper arm         0.16                            +-----------------+-------------+----------+--------+   Dist upper arm        0.18                            +-----------------+-------------+----------+--------+   Antecubital fossa     0.17                            +-----------------+-------------+----------+--------+   +-----------------+-------------+----------+--------+   Left Cephalic     Diameter (cm) Depth (cm) Findings   +-----------------+-------------+----------+--------+   Shoulder              0.18                            +-----------------+-------------+----------+--------+   Prox upper arm        0.16                            +-----------------+-------------+----------+--------+   Mid upper arm  0.21                            +-----------------+-------------+----------+--------+   Dist upper arm        0.19                            +-----------------+-------------+----------+--------+   Antecubital fossa     0.28                             +-----------------+-------------+----------+--------+   Prox forearm          0.16                            +-----------------+-------------+----------+--------+   Mid forearm           0.11                            +-----------------+-------------+----------+--------+   +-----------------+-------------+----------+--------+   Left Basilic      Diameter (cm) Depth (cm) Findings   +-----------------+-------------+----------+--------+   Mid upper arm         0.18                            +-----------------+-------------+----------+--------+   Dist upper arm        0.16                            +-----------------+-------------+----------+--------+   Antecubital fossa     0.15                            +-----------------+-------------+----------+--------+   Summary: Right: Patent and compressible basilic vein.         The cephalic vein is thrombosed in the forearm.  Left: Patent and compressible cephalic and basilic veins.   ASSESSMENT/PLAN: This is a 41 y.o. female with ESRD in need of permanent dialysis access with recent sepsis due to [facility].  This is being replaced by IR and we are asked to place permanent access.   -pt is right hand dominant -will repeat vein mapping since she had this done last July -she will need to be off of her Eliquis a couple of days for surgery-will try to plan this next week. -will restrict left upper extremity -discussed with pt that all access will need intervention or fail and will need new access at some point.  She expressed understanding -Dr. Virl Cagey will be by to see pt later today or tomorrow.  Leontine Locket, PA-C [facility] [phone]   VASCULAR STAFF ADDENDUM: I agree with the above.  Pt will need to be transitioned to heparin from Eliquis. Will work to find time next week after TEE Monday.  Will follow up vein mapping and discuss surgical plan accordingly  Cassandria Santee, MD [facility] Phone Number: [phone] 01/31/2022 5:36 PM

## 2022-01-31 NOTE — Progress Notes (Signed)
Pt has not voided during shift. 320cc of urine was noted in bladder via bladder scanner. MD notified. No new orders. ?

## 2022-01-31 NOTE — Consult Note (Signed)
Chief Complaint: Patient was seen in consultation today for end stage renal disease.  Referring Physician(s): Dr. Madelon Lips  Supervising Physician: Jacqulynn Cadet  Patient Status: [facility] - In-pt  History of Present Illness: Cathy [name] is a 41 y.o. female with end stage renal disease in need of dialysis access.  Patient admitted with bacteremia.  Currently on line holiday after right tunneled IJ catheter removal 01/29/22.   Blood culture 3/1 positive for MRSA.  No growth to date on repeat blood culture drawn 3/2.  IR consulted for tunneled dialysis catheter replacement.  Of note, patient also working with vascular service for possible fistula.    Past Medical History:  Diagnosis Date   Anasarca associated with disorder of kidney 06/08/2021   Anxiety    Asthma    Bipolar depression ([place])    Depression    Dyspnea    ESRD (end stage renal disease) on dialysis ([place]) 10/2020   GERD (gastroesophageal reflux disease)    Gonorrhea    Lupus (HCC)    Pelvic inflammatory disease (PID)    Pleural effusion 06/08/2021   Renal hypertension    Trichomonas infection     Past Surgical History:  Procedure Laterality Date   IR FLUORO GUIDE CV LINE RIGHT  11/30/2020   IR FLUORO GUIDE CV LINE RIGHT  06/13/2021   IR REMOVAL TUN CV CATH W/O FL  06/11/2021   IR REMOVAL TUN CV CATH W/O FL  01/29/2022   IR US GUIDE VASC ACCESS RIGHT  11/30/2020   IR US GUIDE VASC ACCESS RIGHT  06/13/2021   RENAL BIOPSY     TEE WITHOUT CARDIOVERSION N/A 06/13/2021   Procedure: TRANSESOPHAGEAL ECHOCARDIOGRAM (TEE);  Surgeon: Josue Hector, MD;  Location: [place];  Service: Cardiovascular;  Laterality: N/A;   TUBAL LIGATION N/A 02/03/2019   Procedure: POST PARTUM TUBAL LIGATION;  Surgeon: Aletha Halim, MD;  Location: [facility] LD ORS;  Service: Gynecology;  Laterality: N/A;    Allergies: Patient has no known allergies.  Medications: Prior to Admission medications   Medication Sig Start Date End  Date Taking? Authorizing Provider  acetaminophen (TYLENOL) 500 MG tablet Take 1,000 mg by mouth every 6 (six) hours as needed for moderate pain.   Yes [provider]  albuterol (VENTOLIN HFA) 108 (90 Base) MCG/ACT inhaler Inhale 2 puffs into the lungs every 6 (six) hours as needed for wheezing or shortness of breath. 12/17/21 12/17/22 Yes Rai, Ripudeep K, MD  amLODipine (NORVASC) 10 MG tablet Take 1 tablet (10 mg total) by mouth daily. 01/09/22  Yes Shelly Coss, MD  apixaban (ELIQUIS) 5 MG TABS tablet Take 1 tablet (5 mg total) by mouth 2 (two) times daily. 01/15/22  Yes Shelly Coss, MD  calcium acetate (PHOSLO) 667 MG capsule Take 2 capsules (1,334 mg total) by mouth 3 (three) times daily with meals. 01/09/22  Yes Shelly Coss, MD  cloNIDine (CATAPRES) 0.3 MG tablet Take 1 tablet (0.3 mg total) by mouth 3 (three) times daily. 01/09/22  Yes Shelly Coss, MD  colchicine 0.6 MG tablet Take 0.5 tablets (0.3 mg total) by mouth daily. 12/31/21  Yes Arrien, Jimmy Picket, MD  guaiFENesin (MUCINEX) 600 MG 12 hr tablet Take 600 mg by mouth 2 (two) times daily as needed for cough.   Yes [provider]  hydrALAZINE (APRESOLINE) 100 MG tablet Take 1 tablet (100 mg total) by mouth 3 (three) times daily. 01/09/22  Yes Shelly Coss, MD  hydroxychloroquine (PLAQUENIL) 200 MG tablet Take 1/2 tablet (  100 mg total) by mouth every other day after hemodialysis 01/09/22  Yes Shelly Coss, MD  hydrOXYzine (ATARAX) 25 MG tablet Take 1 tablet (25 mg total) by mouth 3 (three) times daily as needed for itching or anxiety. 12/17/21  Yes Rai, Ripudeep K, MD  ipratropium (ATROVENT HFA) 17 MCG/ACT inhaler Inhale 2 puffs into the lungs every 6 (six) hours as needed for wheezing. 12/17/21 12/17/22 Yes Rai, Ripudeep K, MD  loratadine (CLARITIN) 10 MG tablet Take 10 mg by mouth daily.   Yes [provider]  predniSONE (DELTASONE) 20 MG tablet Take 2 tablets (40 mg total) by mouth daily with breakfast.  12/31/21 04/05/22 Yes Arrien, Jimmy Picket, MD  torsemide (DEMADEX) 20 MG tablet Take 1 tablet (20 mg total) by mouth 2 (two) times daily. 01/09/22  Yes Shelly Coss, MD  Blood Pressure Monitoring (OMRON 3 SERIES BP MONITOR) DEVI Use as directed 2 times a day 01/09/22   Shelly Coss, MD  carvedilol (COREG) 12.5 MG tablet Take 1 tablet (12.5 mg total) by mouth 2 (two) times daily with a meal. Patient not taking: Reported on 01/31/2022 01/09/22   Shelly Coss, MD  methocarbamol (ROBAXIN) 500 MG tablet Take 1 tablet (500 mg total) by mouth every 6 (six) hours as needed for muscle spasms. Patient not taking: Reported on 01/31/2022 12/17/21   Rai, Vernelle Emerald, MD  nicotine (NICODERM CQ - DOSED IN MG/24 HOURS) 21 mg/24hr patch Place 1 patch (21 mg total) onto the skin daily. Patient not taking: Reported on 01/31/2022 01/10/22   Shelly Coss, MD  oxyCODONE (OXY IR/ROXICODONE) 5 MG immediate release tablet Take 1 tablet (5 mg total) by mouth every 6 (six) hours as needed for severe pain. Patient not taking: Reported on 01/31/2022 12/30/21   Arrien, Jimmy Picket, MD  fenofibrate 160 MG tablet Take 1 tablet (160 mg total) by mouth daily. 12/31/20 01/20/21  Elmarie Shiley, MD     Family History  Problem Relation Age of Onset   Diabetes Maternal Grandmother    Hypertension Maternal Grandmother    Diabetes Maternal Grandfather    Hypertension Maternal Grandfather    Diabetes Paternal Grandmother    Hypertension Paternal Grandmother    Diabetes Paternal Grandfather    Hypertension Paternal Grandfather     Social History   Socioeconomic History   Marital status: Single    Spouse name: Not on file   Number of children: Not on file   Years of education: Not on file   Highest education level: Not on file  Occupational History   Occupation: unemployed  Tobacco Use   Smoking status: Some Days    Packs/day: 0.25    Types: Cigarettes    Last attempt to quit: 10/08/2020    Years since quitting: 1.3    Smokeless tobacco: Never  Vaping Use   Vaping Use: Never used  Substance and Sexual Activity   Alcohol use: Not Currently   Drug use: Not Currently    Types: Marijuana    Comment: 3 times a week    Sexual activity: Not Currently    Birth control/protection: None  Other Topics Concern   Not on file  Social History Narrative   Not on file   Social Determinants of Health   Financial Resource Strain: Not on file  Food Insecurity: Not on file  Transportation Needs: Not on file  Physical Activity: Not on file  Stress: Not on file  Social Connections: Not on file     Review of  Systems: A 12 point ROS discussed and pertinent positives are indicated in the HPI above.  All other systems are negative.  Review of Systems  Constitutional:  Negative for fatigue and fever.  Respiratory:  Negative for cough and shortness of breath.   Cardiovascular:  Negative for chest pain.  Gastrointestinal:  Negative for abdominal pain.  Genitourinary:  Negative for dysuria.  Musculoskeletal:  Negative for back pain.  Psychiatric/Behavioral:  Negative for behavioral problems and confusion.    Vital Signs: BP (!) 118/99 (BP Location: Right Arm)    Pulse 62    Temp 98.4 F (36.9 C) (Oral)    Resp 19    Ht 5\' 7"  (1.702 m)    Wt 139 lb 15.9 oz (63.5 kg)    SpO2 100%    BMI 21.93 kg/m   Physical Exam Vitals and nursing note reviewed.  Constitutional:      General: She is not in acute distress.    Appearance: She is well-developed. She is not ill-appearing.  Neck:     Comments: Dressing clean and dry from recent removal. Cardiovascular:     Rate and Rhythm: Normal rate and regular rhythm.  Pulmonary:     Effort: Pulmonary effort is normal.     Breath sounds: Normal breath sounds.  Skin:    General: Skin is warm and dry.  Neurological:     Mental Status: She is alert.     MD Evaluation Airway: WNL Heart: WNL Abdomen: WNL Chest/ Lungs: WNL ASA  Classification: 3 Mallampati/Airway  Score: Two   Imaging: DG Chest 2 View  Result Date: 01/29/2022 CLINICAL DATA:  Possible sepsis. EXAM: CHEST - 2 VIEW COMPARISON:  Chest radiograph dated 01/05/2022. FINDINGS: Dialysis catheter in similar position. Small bilateral pleural effusions with bibasilar atelectasis. No pneumothorax. Stable cardiomegaly. No acute osseous pathology. IMPRESSION: 1. Small bilateral pleural effusions with bibasilar atelectasis. 2. Cardiomegaly. Electronically Signed   By: Anner Crete M.D.   On: 01/29/2022 03:04   IR Removal Tun Cv Cath W/O FL  Result Date: 01/30/2022 INDICATION: Patient with end-stage renal disease on HD via right IJ tunneled dialysis catheter placed 06/21/2021 by Dr. Pascal Lux. Patient admitted with line infection, bacteremia. Request made for tunneled dialysis catheter removal EXAM: REMOVAL TUNNELED CENTRAL VENOUS CATHETER MEDICATIONS: 12 mL 1% lidocaine. ANESTHESIA/SEDATION: None FLUOROSCOPY: None COMPLICATIONS: None immediate. PROCEDURE: Informed written consent was obtained from the patient after a thorough discussion of the procedural risks, benefits and alternatives. All questions were addressed. Maximal Sterile Barrier Technique was utilized including caps, mask, sterile gowns, sterile gloves, sterile drape, hand hygiene and skin antiseptic. A timeout was performed prior to the initiation of the procedure. The patient's right chest and catheter was prepped and draped in a normal sterile fashion. Heparin was removed from both ports of catheter. 1% lidocaine was used for local anesthesia. Using gentle blunt dissection the cuff of the catheter was exposed and the catheter was removed in its entirety. Bloody pus flowed from tract upon removal of catheter. Pressure was held till hemostasis was obtained. A sterile dressing was applied. The patient tolerated the procedure well with no immediate complications. Tip was not sent for culture as it was not requested and patient has blood cultures pending.  IMPRESSION: Successful catheter removal as described above. Read by: Brynda Greathouse PA-C Electronically Signed   By: Ruthann Cancer M.D.   On: 01/30/2022 07:35   DG Chest Port 1 View  Result Date: 01/05/2022 CLINICAL DATA:  Dyspnea, missed dialysis EXAM: PORTABLE  CHEST 1 VIEW COMPARISON:  12/26/2021 FINDINGS: Moderate to severe cardiomegaly is stable. Pulmonary vascularity is normal. Small bilateral pleural effusions are present, left greater than right. No pneumothorax. No confluent pulmonary infiltrate. Right internal jugular hemodialysis catheter tip noted within the right atrium. No acute bone abnormality. IMPRESSION: Stable cardiomegaly. Small bilateral pleural effusions, left greater than right. Electronically Signed   By: Fidela Salisbury M.D.   On: 01/05/2022 19:52   ECHOCARDIOGRAM COMPLETE  Result Date: 01/30/2022    ECHOCARDIOGRAM REPORT   Patient Name:   SALLIE STARON Date of Exam: 01/30/2022 Medical Rec #:  [id number]        Height:       67.0 in Accession #:    [id number]       Weight:       140.0 lb Date of Birth:  March 17, 1981       BSA:          1.738 m Patient Age:    81 years         BP:           117/91 mmHg Patient Gender: F                HR:           74 bpm. Exam Location:  Inpatient Procedure: 2D Echo Indications:    Bacteremia  History:        Patient has prior history of Echocardiogram examinations, most                 recent 01/06/2022. Pericardial Disease; Signs/Symptoms:Shortness                 of Breath.  Sonographer:    Arlyss Gandy Referring Phys: [id number] [place]  1. Left ventricular ejection fraction, by estimation, is 35 to 40%. The left ventricle has mildly decreased function. The left ventricle demonstrates global hypokinesis. There is moderate concentric left ventricular hypertrophy. The appearance of the myocardium is concerning for possible infiltrative cardiomypathy - consider further work-up for amyloid. Left ventricular diastolic parameters are  consistent with Grade III diastolic dysfunction (restrictive). Elevated left ventricular end-diastolic pressure. The E/e' is 61.  2. Right ventricular systolic function is mildly reduced. The right ventricular size is normal. There is normal pulmonary artery systolic pressure. The estimated right ventricular systolic pressure is 89.1 mmHg.  3. Left atrial size was mildly dilated.  4. Right atrial size was moderately dilated.  5. The pericardial effusion is posterior to the left ventricle.  6. The mitral valve is abnormal. Mild mitral valve regurgitation.  7. The tricuspid valve is abnormal. Tricuspid valve regurgitation is moderate to severe.  8. The aortic valve is tricuspid. Aortic valve regurgitation is trivial. Aortic valve sclerosis is present, with no evidence of aortic valve stenosis.  9. Mildly dilated pulmonary artery. Comparison(s): Changes from prior study are noted. 01/06/2022: LVEF 20-25%. Conclusion(s)/Recommendation(s): Although there is valve thickening, there is No evidence of valvular vegetation on this transthoracic echocardiogram. Consider a transesophageal echocardiogram to exclude infective endocarditis if clinically indicated. FINDINGS  Left Ventricle: Left ventricular ejection fraction, by estimation, is 35 to 40%. The left ventricle has moderately decreased function. The left ventricle demonstrates global hypokinesis. The left ventricular internal cavity size was normal in size. There is moderate concentric left ventricular hypertrophy. Left ventricular diastolic parameters are consistent with Grade III diastolic dysfunction (restrictive). Elevated left ventricular end-diastolic pressure. The E/e' is 15. Right Ventricle: The right ventricular size is normal. No  increase in right ventricular wall thickness. Right ventricular systolic function is mildly reduced. There is normal pulmonary artery systolic pressure. The tricuspid regurgitant velocity is 1.16 m/s, and with an assumed right atrial  pressure of 15 mmHg, the estimated right ventricular systolic pressure is 37.8 mmHg. Left Atrium: Left atrial size was mildly dilated. Right Atrium: Right atrial size was moderately dilated. Pericardium: Trivial pericardial effusion is present. The pericardial effusion is posterior to the left ventricle. Mitral Valve: The mitral valve is abnormal. There is moderate thickening of the anterior and posterior mitral valve leaflet(s). Mild mitral valve regurgitation. Tricuspid Valve: The tricuspid valve is abnormal. Tricuspid valve regurgitation is moderate to severe. Aortic Valve: The aortic valve is tricuspid. Aortic valve regurgitation is trivial. Aortic valve sclerosis is present, with no evidence of aortic valve stenosis. Aortic valve mean gradient measures 4.0 mmHg. Aortic valve peak gradient measures 8.0 mmHg. Aortic valve area, by VTI measures 2.51 cm. Pulmonic Valve: The pulmonic valve was grossly normal. Pulmonic valve regurgitation is mild. Aorta: The aortic root and ascending aorta are structurally normal, with no evidence of dilitation. Pulmonary Artery: The pulmonary artery is mildly dilated. IAS/Shunts: There is left bowing of the interatrial septum, suggestive of elevated right atrial pressure. No atrial level shunt detected by color flow Doppler.  LEFT VENTRICLE PLAX 2D LVIDd:         4.80 cm   Diastology LVIDs:         4.00 cm   LV e' medial:    4.57 cm/s LV PW:         1.20 cm   LV E/e' medial:  33.5 LV IVS:        1.20 cm   LV e' lateral:   6.31 cm/s LVOT diam:     2.00 cm   LV E/e' lateral: 24.2 LV SV:         61 LV SV Index:   35 LVOT Area:     3.14 cm  RIGHT VENTRICLE            IVC RV Basal diam:  4.40 cm    IVC diam: 3.10 cm RV Mid diam:    3.50 cm RV S prime:     7.29 cm/s TAPSE (M-mode): 1.1 cm LEFT ATRIUM           Index        RIGHT ATRIUM           Index LA diam:      3.80 cm 2.19 cm/m   RA Area:     23.40 cm LA Vol (A2C): 63.4 ml 36.48 ml/m  RA Volume:   73.20 ml  42.12 ml/m LA Vol  (A4C): 37.2 ml 21.41 ml/m  AORTIC VALVE AV Area (Vmax):    2.72 cm AV Area (Vmean):   2.53 cm AV Area (VTI):     2.51 cm AV Vmax:           141.00 cm/s AV Vmean:          93.000 cm/s AV VTI:            0.243 m AV Peak Grad:      8.0 mmHg AV Mean Grad:      4.0 mmHg LVOT Vmax:         122.00 cm/s LVOT Vmean:        74.900 cm/s LVOT VTI:          0.194 m LVOT/AV VTI ratio: 0.80  AORTA Ao Root  diam: 2.80 cm Ao Asc diam:  2.80 cm MITRAL VALVE                TRICUSPID VALVE MV Area (PHT): 5.84 cm     TR Peak grad:   5.4 mmHg MV Decel Time: 130 msec     TR Vmax:        116.00 cm/s MV E velocity: 153.00 cm/s MV A velocity: 50.10 cm/s   SHUNTS MV E/A ratio:  3.05         Systemic VTI:  0.19 m                             Systemic Diam: 2.00 cm Lyman Bishop MD Electronically signed by Lyman Bishop MD Signature Date/Time: 01/30/2022/1:12:26 PM    Final    ECHOCARDIOGRAM COMPLETE  Result Date: 01/06/2022    ECHOCARDIOGRAM REPORT   Patient Name:   SYLVESTER SALONGA Date of Exam: 01/06/2022 Medical Rec #:  [id number]        Height:       67.0 in Accession #:    [id number]       Weight:       114.0 lb Date of Birth:  1981/03/14       BSA:          1.593 m Patient Age:    64 years         BP:           177/125 mmHg Patient Gender: F                HR:           86 bpm. Exam Location:  Inpatient Procedure: 2D Echo, Cardiac Doppler and Color Doppler Indications:    pericardial effusion  History:        Patient has prior history of Echocardiogram examinations, most                 recent 12/27/2021. Risk Factors:Hypertension. ESRD.  Sonographer:    Melissa Morford RDCS (AE, PE) Referring Phys: [email or web address]  1. Left ventricular ejection fraction, by estimation, is 20 to 25%. The left ventricle has severely decreased function. The left ventricle demonstrates global hypokinesis. Left ventricular diastolic parameters are consistent with Grade III diastolic dysfunction (restrictive).  2. Right ventricular systolic  function is normal. The right ventricular size is normal.  3. Left atrial size was mildly dilated.  4. Right atrial size was mildly dilated.  5. Moderate pericardial effusion. The pericardial effusion is circumferential. There is no evidence of cardiac tamponade.  6. The mitral valve is normal in structure. Mild mitral valve regurgitation. No evidence of mitral stenosis.  7. Tricuspid valve regurgitation is moderate.  8. The aortic valve is normal in structure. Aortic valve regurgitation is mild. No aortic stenosis is present.  9. Pulmonic valve regurgitation is moderate. 10. The inferior vena cava is normal in size with greater than 50% respiratory variability, suggesting right atrial pressure of 3 mmHg. Comparison(s): No significant change from prior study. Prior images reviewed side by side. FINDINGS  Left Ventricle: Left ventricular ejection fraction, by estimation, is 20 to 25%. The left ventricle has severely decreased function. The left ventricle demonstrates global hypokinesis. The left ventricular internal cavity size was normal in size. There is no left ventricular hypertrophy. Left ventricular diastolic parameters are consistent with Grade III diastolic dysfunction (restrictive). Right Ventricle: The right ventricular size is  normal. No increase in right ventricular wall thickness. Right ventricular systolic function is normal. Left Atrium: Left atrial size was mildly dilated. Right Atrium: Right atrial size was mildly dilated. Pericardium: A moderately sized pericardial effusion is present. The pericardial effusion is circumferential. There is no evidence of cardiac tamponade. Mitral Valve: The mitral valve is normal in structure. Mild mitral valve regurgitation. No evidence of mitral valve stenosis. Tricuspid Valve: The tricuspid valve is normal in structure. Tricuspid valve regurgitation is moderate . No evidence of tricuspid stenosis. Aortic Valve: The aortic valve is normal in structure. Aortic valve  regurgitation is mild. No aortic stenosis is present. Pulmonic Valve: The pulmonic valve was normal in structure. Pulmonic valve regurgitation is moderate. No evidence of pulmonic stenosis. Aorta: The aortic root is normal in size and structure. Venous: The inferior vena cava is normal in size with greater than 50% respiratory variability, suggesting right atrial pressure of 3 mmHg. IAS/Shunts: No atrial level shunt detected by color flow Doppler.  LEFT VENTRICLE PLAX 2D LVIDd:         5.17 cm      Diastology LVIDs:         4.62 cm      LV e' medial:    5.77 cm/s LV PW:         1.11 cm      LV E/e' medial:  21.8 LV IVS:        1.16 cm      LV e' lateral:   5.93 cm/s LVOT diam:     2.00 cm      LV E/e' lateral: 21.2 LV SV:         57 LV SV Index:   36 LVOT Area:     3.14 cm  LV Volumes (MOD) LV vol d, MOD A2C: 133.0 ml LV vol d, MOD A4C: 104.5 ml LV vol s, MOD A2C: 110.0 ml LV vol s, MOD A4C: 96.6 ml LV SV MOD A2C:     23.0 ml LV SV MOD A4C:     104.5 ml LV SV MOD BP:      15.0 ml RIGHT VENTRICLE TAPSE (M-mode): 1.6 cm LEFT ATRIUM             Index        RIGHT ATRIUM           Index LA diam:        4.30 cm 2.70 cm/m   RA Area:     19.50 cm LA Vol (A2C):   81.1 ml 50.93 ml/m  RA Volume:   53.30 ml  33.47 ml/m LA Vol (A4C):   69.5 ml 43.64 ml/m LA Biplane Vol: 79.8 ml 50.11 ml/m  AORTIC VALVE LVOT Vmax:   109.00 cm/s LVOT Vmean:  69.500 cm/s LVOT VTI:    0.183 m  AORTA Ao Root diam: 3.10 cm Ao Asc diam:  3.50 cm MITRAL VALVE                TRICUSPID VALVE MV Area (PHT): 6.32 cm     TR Peak grad:   19.7 mmHg MV Decel Time: 120 msec     TR Vmax:        222.00 cm/s MV E velocity: 126.00 cm/s MV A velocity: 68.30 cm/s   SHUNTS MV E/A ratio:  1.84         Systemic VTI:  0.18 m  Systemic Diam: 2.00 cm Candee Furbish MD Electronically signed by Candee Furbish MD Signature Date/Time: 01/06/2022/1:57:47 PM    Final    CT Angio Chest/Abd/Pel for Dissection W and/or W/WO  Result Date:  01/07/2022 CLINICAL DATA:  41 year old female with history of chest or back pain. Suspected aortic dissection. EXAM: CT ANGIOGRAPHY CHEST, ABDOMEN AND PELVIS TECHNIQUE: Non-contrast CT of the chest was initially obtained. Multidetector CT imaging through the chest, abdomen and pelvis was performed using the standard protocol during bolus administration of intravenous contrast. Multiplanar reconstructed images and MIPs were obtained and reviewed to evaluate the vascular anatomy. RADIATION DOSE REDUCTION: This exam was performed according to the departmental dose-optimization program which includes automated exposure control, adjustment of the mA and/or kV according to patient size and/or use of iterative reconstruction technique. CONTRAST:  16mL OMNIPAQUE IOHEXOL 350 MG/ML SOLN COMPARISON:  CT the chest, abdomen and pelvis 12/27/2021. FINDINGS: CTA CHEST FINDINGS Cardiovascular: There is an occlusive segmental sized filling defect in a right lower lobe pulmonary artery branch best appreciated on axial image 92 of series 8. Heart size is mildly enlarged. Moderate to large volume of pericardial fluid. No pericardial calcification. Precontrast images demonstrate no crescentic high attenuation associated with the wall of the thoracic aorta to suggest acute intramural hemorrhage. No evidence of thoracic aortic aneurysm or dissection. No atherosclerotic calcifications are noted in the thoracic aorta or the coronary arteries. Right internal jugular PermCath with tip terminating in the right atrium. Mediastinum/Nodes: No pathologically enlarged mediastinal or hilar lymph nodes. Esophagus is unremarkable in appearance. No axillary lymphadenopathy. 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 Extensive passive atelectasis in the left lower lobe and to a lesser extent in  the inferior segment of the lingula. Trace right and small left pleural effusions lying dependently. Musculoskeletal: There are no aggressive appearing lytic or blastic lesions noted in the visualized portions of the skeleton. Review of the MIP images confirms the above findings. CTA ABDOMEN AND PELVIS FINDINGS VASCULAR Aorta: Normal caliber aorta without aneurysm, dissection, vasculitis or significant stenosis. Celiac: Patent without evidence of aneurysm, dissection, vasculitis or significant stenosis. SMA: Patent without evidence of aneurysm, dissection, vasculitis or significant stenosis. Renals: Both renal arteries are patent without evidence of aneurysm, dissection, vasculitis, fibromuscular dysplasia or significant stenosis. IMA: Patent without evidence of aneurysm, dissection, vasculitis or significant stenosis. Inflow: Patent without evidence of aneurysm, dissection, vasculitis or significant stenosis. Veins: No obvious venous abnormality within the limitations of this arterial phase study. Review of the MIP images confirms the above findings. NON-VASCULAR Hepatobiliary: No definite suspicious cystic or solid hepatic lesions are confidently identified on today's arterial phase examination. No intra or extrahepatic biliary ductal dilatation. Gallbladder is unremarkable in appearance. Pancreas: No definite pancreatic mass or peripancreatic fluid collections or inflammatory changes are noted on today's arterial phase examination. No pancreatic ductal dilatation. Spleen: Unremarkable. Adrenals/Urinary Tract: Bilateral kidneys and adrenal glands are normal in appearance. No hydroureteronephrosis. Urinary bladder is normal in appearance. Stomach/Bowel: The appearance of the stomach is normal. No pathologic dilatation of small bowel or colon. Lymphatic: Enlarged left para-aortic lymph node measuring up to 1.5 cm in short axis (axial image 167 of series 8). No other definite lymphadenopathy noted elsewhere in the  abdomen or pelvis. Reproductive: Uterus and ovaries are unremarkable in appearance. Bilateral tubal ligation clips are noted. Other: Small to moderate volume of ascites.  No pneumoperitoneum. Musculoskeletal:  There are no aggressive appearing lytic or blastic lesions noted in the visualized portions of the skeleton. Review of the MIP images confirms the above findings. IMPRESSION: 1. Study is positive for segmental sized pulmonary embolism in the right lower lobe with area of alveolar hemorrhage in the anterolateral aspect of the right lower lobe. 2. Small left and trace right pleural effusions with some passive atelectasis in the left lung base. 3. Moderate to large pericardial effusion. 4. Small volume of ascites. 5. Enlarged left para-aortic lymph node measuring 1.5 cm in short axis. This is nonspecific, but attention on follow-up studies is recommended. 6. Additional incidental findings, as above. These results will be called to the ordering clinician or representative by the Radiologist Assistant, and communication documented in the PACS or [employer]. Electronically Signed   By: Vinnie Langton M.D.   On: 01/07/2022 05:58    Labs:  CBC: Recent Labs    01/08/22 0444 01/29/22 0246 01/30/22 0432 01/31/22 0202  WBC 9.0 15.4* 11.8* 13.2*  HGB 8.2* 10.9* 10.0* 9.3*  HCT 26.1* 35.1* 32.1* 29.8*  PLT 333 176 155 136*    COAGS: Recent Labs    06/08/21 1923 12/13/21 1005 12/27/21 0340 01/29/22 0246  INR 1.0 1.2 1.2 2.1*  APTT 28 30 32 32    BMP: Recent Labs    01/08/22 0444 01/29/22 0246 01/30/22 0432 01/31/22 0202  NA 135 136 134* 130*  K 4.3 5.9* 4.7 4.9  CL 97* 96* 95* 93*  CO2 25 22 25 23   GLUCOSE 105* 93 89 118*  BUN 35* 85* 42* 58*  CALCIUM 8.3* 8.6* 7.4* 7.7*  CREATININE 4.54* 9.33* 5.87* 6.91*  GFRNONAA 12* 5* 9* 7*    LIVER FUNCTION TESTS: Recent Labs    12/27/21 0116 12/28/21 0232 12/29/21 1004 01/06/22 1937 01/07/22 0132 01/08/22 0444 01/29/22 0246  01/31/22 0202  BILITOT 0.6 0.3  --  0.2*  --   --  0.9  --   AST 19 13*  --  21  --   --  30  --   ALT 19 15  --  21  --   --  44  --   ALKPHOS 318* 265*  --  210*  --   --  192*  --   PROT 6.9 6.0*  --  6.1*  --   --  6.2*  --   ALBUMIN 2.5* 2.2*   < > 2.6* 2.5* 2.4* 2.8* 2.5*   < > = values in this interval not displayed.    TUMOR MARKERS: No results for input(s): AFPTM, CEA, CA199, CHROMGRNA in the last 8760 hours.  Assessment and Plan: End stage renal disease on dialysis Patient known to IR from prior dialysis access placements. She recently underwent R IJ tunneled catheter removal 3/1 due to bacteremia/line infection. She is now in need of replacement.  Blood cultures negative yesterday. Afebrile. Plan for replacement tomorrow in IR as schedule permits and provided she continues to progress clinically.   Risks and benefits discussed with the patient including, but not limited to bleeding, infection, vascular injury, pneumothorax which may require chest tube placement, air embolism or even death  All of the patient's questions were answered, patient is agreeable to proceed. Consent signed and in chart.   Thank you for this interesting consult.  I greatly enjoyed meeting RAMSHA LONIGRO and look forward to participating in their care.  A copy of this report was sent to the requesting provider on this date.  Electronically Signed: Docia Barrier, PA 01/31/2022, 4:52 PM   I spent a total of 20 Minutes    in face to face in clinical consultation, greater than 50% of which was counseling/coordinating care for renal failure.

## 2022-01-31 NOTE — Progress Notes (Signed)
? ?  [facility] has been requested to perform a transesophageal echocardiogram on Cathy [name] for bacteremia  After careful review of history and examination, the risks and benefits of transesophageal echocardiogram have been explained including risks of esophageal damage, perforation (1:10,000 risk), bleeding, pharyngeal hematoma as well as other potential complications associated with conscious sedation including aspiration, arrhythmia, respiratory failure and death. Alternatives to treatment were discussed, questions were answered. Patient is willing to proceed. Patient has never had any radiation to chest and denies any dysphagia. ? ?Procedure is scheduled for Monday 02/03/2022 at 9:00am with Dr. Harriet Masson. Will place pre-procedural orders. ? ?Darreld Mclean, PA-C ?01/31/2022 4:32 PM  ? ?

## 2022-01-31 NOTE — Progress Notes (Signed)
PROGRESS NOTE        PATIENT DETAILS Name: Cathy [name] Age: 41 y.o. Sex: female Date of Birth: December 13, 1980 Admit Date: 01/29/2022 Admitting Physician Christel Mormon, MD PCP:Pcp, No  Brief Summary: Patient is a 41 y.o.  female with history of ESRD, lupus, HTN, HLD-history of chronic under dialysis-numerous recent hospitalizations-presented with fever-found to have MRSA bacteremia.  Significant Hospital events: 1/13-1/17>> RLL pneumonia, large parapneumonic effusion, s/p chest tube and cefepime  1/26-1/30>> RML pneumonia, pericarditis/ effusion (? Lupus vs uremic), and lupus flare 2/5/-2/9>> hypertensive urgency, PE, EF down to 20-25% 3/1>> admit for fever-found to have MRSA bacteremia. 3/1>> tunneled HD catheter removed after dialysis.  Significant imaging studies: 3/1>> CXR: Small bilateral pleural effusions. 3/2>> Echo: No obvious vegetations, EF 35-40%.  Grade 3 diastolic dysfunction.  Trivial pericardial effusion.  Significant microbiology data: 3/1>> COVID/influenza PCR: Positive  Procedures: 3/1>> removal of tunneled dialysis catheter by IR. 3/1>> blood culture: MRSA 3/2>> blood culture: Negative.  Consults:  ID, IR, nephrology  Subjective: Still weak but feels overall better.  Prior [facility] catheter site less painful.  Objective: Vitals: Blood pressure 108/72, pulse 60, temperature 98 F (36.7 C), temperature source Oral, resp. rate 18, height 5\' 7"  (1.702 m), weight 63.5 kg, SpO2 96 %, unknown if currently breastfeeding.   Exam: Gen Exam:Alert awake-not in any distress HEENT:atraumatic, normocephalic Chest: B/L clear to auscultation anteriorly CVS:S1S2 regular Abdomen:soft non tender, non distended Extremities:+ edema Neurology: Non focal Skin: no rash   Pertinent Labs/Radiology: CBC Latest Ref Rng & Units 01/31/2022 01/30/2022 01/29/2022  WBC 4.0 - 10.5 K/uL 13.2(H) 11.8(H) 15.4(H)  Hemoglobin 12.0 - 15.0 g/dL 9.3(L) 10.0(L) 10.9(L)   Hematocrit 36.0 - 46.0 % 29.8(L) 32.1(L) 35.1(L)  Platelets 150 - 400 K/uL 136(L) 155 176    Lab Results  Component Value Date   NA 130 (L) 01/31/2022   K 4.9 01/31/2022   CL 93 (L) 01/31/2022   CO2 23 01/31/2022      Assessment/Plan: SIRS due to MRSA bacteremia: SIRS physiology has resolved-still has some leukocytosis-on vancomycin-TDC removed on 3/1-line holiday in progress.  Repeat blood cultures done on 3/2 negative so far.  Transthoracic echo on 3/2 without any obvious vegetations-TEE scheduled for this coming Monday.  Await further recommendations from infectious disease.   Note-sepsis ruled out  ESRD on HD TTS: Nephrology following.  Longstanding history of under dialysis/noncompliance.  Normocytic anemia: Due to CKD-defer Aranesp/iron therapy to nephrology.  Chronic combined systolic and diastolic heart failure: Trace lower extremity edema but otherwise stable.  Diuresis with HD.  Will need cardiology input at some point in time when she is a bit more stable-echo findings suspicious for infiltrative cardiomyopathy.    Pericardial effusion: Appears to be a chronic issue-suspect related to poor compliance to dialysis/under dialysis.  Echo on 3/2 showed small pericardial effusion.  Pulmonary embolism: Remains on Eliquis.  HTN: BP stable-continue amlodipine, Coreg, clonidine  History of lupus: Appears stable-continue Plaquenil and prednisone  Elevated point-of-care beta-hCG: Likely false positive elevation-Per patient-she has not had her menstrual period for close to a year-and she has not engage in sexual intercourse for "years".  Formal beta hCG not elevated.  Tobacco abuse: Encourage cessation-on transdermal nicotine.  Debility/deconditioning: Some of this is chronic-likely worsened by MRSA bacteremia.  Mobilize with rehab services and see how she does.  BMI: Estimated body mass index  is 21.93 kg/m as calculated from the following:   Height as of this encounter: 5'  7" (1.702 m).   Weight as of this encounter: 63.5 kg.   Code status:   Code Status: Full Code   DVT Prophylaxis: apixaban (ELIQUIS) tablet 5 mg    Family Communication: None at bedside  Disposition Plan: Status is: Inpatient Remains inpatient appropriate because: MRSA bacteremia-on IV vancomycin-work-up in progress-not yet stable for discharge.   Planned Discharge Destination:Home   Diet: Diet Order             Diet renal with fluid restriction Fluid restriction: 1200 mL Fluid; Room service appropriate? Yes; Fluid consistency: Thin  Diet effective now                     Antimicrobial agents: Anti-infectives (From admission, onward)    Start     Dose/Rate Route Frequency Ordered Stop   01/30/22 2200  ceFEPIme (MAXIPIME) 1 g in sodium chloride 0.9 % 100 mL IVPB  Status:  Discontinued        1 g 200 mL/hr over 30 Minutes Intravenous Every 24 hours 01/29/22 0606 01/29/22 1453   01/30/22 1200  vancomycin (VANCOCIN) 750 mg in sodium chloride 0.9 % 250 mL IVPB  Status:  Discontinued        750 mg 250 mL/hr over 60 Minutes Intravenous Every T-Th-Sa (Hemodialysis) 01/29/22 0606 01/30/22 0806   01/30/22 1000  hydroxychloroquine (PLAQUENIL) tablet 100 mg        100 mg Oral Every 48 hours 01/29/22 0904     01/30/22 0900  vancomycin (VANCOREADY) IVPB 750 mg/150 mL        750 mg 150 mL/hr over 60 Minutes Intravenous  Once 01/30/22 0806 01/30/22 1056   01/30/22 0806  vancomycin variable dose per unstable renal function (pharmacist dosing)         Does not apply See admin instructions 01/30/22 0806     01/29/22 0615  ceFEPIme (MAXIPIME) 2 g in sodium chloride 0.9 % 100 mL IVPB        2 g 200 mL/hr over 30 Minutes Intravenous  Once 01/29/22 0600 01/29/22 0722   01/29/22 0615  metroNIDAZOLE (FLAGYL) IVPB 500 mg        500 mg 100 mL/hr over 60 Minutes Intravenous  Once 01/29/22 0600 01/29/22 0903   01/29/22 0615  vancomycin (VANCOCIN) IVPB 1000 mg/200 mL premix  Status:   Discontinued        1,000 mg 200 mL/hr over 60 Minutes Intravenous  Once 01/29/22 0600 01/29/22 0604   01/29/22 0615  Vancomycin (VANCOCIN) 1,250 mg in sodium chloride 0.9 % 250 mL IVPB        1,250 mg 166.7 mL/hr over 90 Minutes Intravenous  Once 01/29/22 0604 01/29/22 0934        MEDICATIONS: Scheduled Meds:  amLODipine  10 mg Oral Daily   apixaban  5 mg Oral BID   calcium acetate  1,334 mg Oral TID WC   carvedilol  12.5 mg Oral BID WC   Chlorhexidine Gluconate Cloth  6 each Topical Q0600   cloNIDine  0.3 mg Oral TID   colchicine  0.3 mg Oral Daily   hydrALAZINE  100 mg Oral TID   hydroxychloroquine  100 mg Oral Q48H   nicotine  14 mg Transdermal Daily   predniSONE  40 mg Oral Q breakfast   sodium chloride flush  3 mL Intravenous Q12H   sodium zirconium cyclosilicate  10  g Oral Daily   vancomycin variable dose per unstable renal function (pharmacist dosing)   Does not apply See admin instructions   Continuous Infusions: PRN Meds:.acetaminophen **OR** acetaminophen, albuterol, calcium carbonate (dosed in mg elemental calcium), docusate sodium, feeding supplement (NEPRO CARB STEADY), HYDROmorphone (DILAUDID) injection, hydrOXYzine, methocarbamol, ondansetron **OR** ondansetron (ZOFRAN) IV, oxyCODONE, sorbitol, zolpidem   I have personally reviewed following labs and imaging studies  LABORATORY DATA: CBC: Recent Labs  Lab 01/29/22 0246 01/30/22 0432 01/31/22 0202  WBC 15.4* 11.8* 13.2*  NEUTROABS 14.2*  --   --   HGB 10.9* 10.0* 9.3*  HCT 35.1* 32.1* 29.8*  MCV 93.9 91.7 91.7  PLT 176 155 136*     Basic Metabolic Panel: Recent Labs  Lab 01/29/22 0246 01/30/22 0432 01/31/22 0202  NA 136 134* 130*  K 5.9* 4.7 4.9  CL 96* 95* 93*  CO2 22 25 23   GLUCOSE 93 89 118*  BUN 85* 42* 58*  CREATININE 9.33* 5.87* 6.91*  CALCIUM 8.6* 7.4* 7.7*  PHOS  --   --  5.1*     GFR: Estimated Creatinine Clearance: 10.7 mL/min (A) (by C-G formula based on SCr of 6.91 mg/dL  (H)).  Liver Function Tests: Recent Labs  Lab 01/29/22 0246 01/31/22 0202  AST 30  --   ALT 44  --   ALKPHOS 192*  --   BILITOT 0.9  --   PROT 6.2*  --   ALBUMIN 2.8* 2.5*    No results for input(s): LIPASE, AMYLASE in the last 168 hours. No results for input(s): AMMONIA in the last 168 hours.  Coagulation Profile: Recent Labs  Lab 01/29/22 0246  INR 2.1*     Cardiac Enzymes: No results for input(s): CKTOTAL, CKMB, CKMBINDEX, TROPONINI in the last 168 hours.  BNP (last 3 results) No results for input(s): PROBNP in the last 8760 hours.  Lipid Profile: No results for input(s): CHOL, HDL, LDLCALC, TRIG, CHOLHDL, LDLDIRECT in the last 72 hours.  Thyroid Function Tests: No results for input(s): TSH, T4TOTAL, FREET4, T3FREE, THYROIDAB in the last 72 hours.  Anemia Panel: No results for input(s): VITAMINB12, FOLATE, FERRITIN, TIBC, IRON, RETICCTPCT in the last 72 hours.  Urine analysis:    Component Value Date/Time   COLORURINE YELLOW 11/29/2020 2054   APPEARANCEUR HAZY (A) 11/29/2020 2054   LABSPEC 1.015 11/29/2020 2054   PHURINE 6.0 11/29/2020 2054   GLUCOSEU NEGATIVE 11/29/2020 2054   HGBUR MODERATE (A) 11/29/2020 2054   BILIRUBINUR NEGATIVE 11/29/2020 2054   KETONESUR NEGATIVE 11/29/2020 2054   PROTEINUR >=300 (A) 11/29/2020 2054   UROBILINOGEN 0.2 10/17/2020 1026   NITRITE NEGATIVE 11/29/2020 2054   LEUKOCYTESUR NEGATIVE 11/29/2020 2054    Sepsis Labs: Lactic Acid, Venous    Component Value Date/Time   LATICACIDVEN 3.1 (HH) 01/29/2022 0748    MICROBIOLOGY: Recent Results (from the past 240 hour(s))  Resp Panel by RT-PCR (Flu A&B, Covid) Nasopharyngeal Swab     Status: None   Collection Time: 01/29/22  2:18 AM   Specimen: Nasopharyngeal Swab; Nasopharyngeal(NP) swabs in vial transport medium  Result Value Ref Range Status   SARS Coronavirus 2 by RT PCR NEGATIVE NEGATIVE Final    Comment: (NOTE) SARS-CoV-2 target nucleic acids are NOT  DETECTED.  The SARS-CoV-2 RNA is generally detectable in upper respiratory specimens during the acute phase of infection. The lowest concentration of SARS-CoV-2 viral copies this assay can detect is 138 copies/mL. A negative result does not preclude SARS-Cov-2 infection and should not be used  as the sole basis for treatment or other patient management decisions. A negative result may occur with  improper specimen collection/handling, submission of specimen other than nasopharyngeal swab, presence of viral mutation(s) within the areas targeted by this assay, and inadequate number of viral copies(<138 copies/mL). A negative result must be combined with clinical observations, patient history, and epidemiological information. The expected result is Negative.  Fact Sheet for Patients:  EntrepreneurPulse.com.au  Fact Sheet for Healthcare Providers:  IncredibleEmployment.be  This test is no t yet approved or cleared by the [place] FDA and  has been authorized for detection and/or diagnosis of SARS-CoV-2 by FDA under an Emergency Use Authorization (EUA). This EUA will remain  in effect (meaning this test can be used) for the duration of the COVID-19 declaration under Section 564(b)(1) of the Act, 21 U.S.C.section 360bbb-3(b)(1), unless the authorization is terminated  or revoked sooner.       Influenza A by PCR NEGATIVE NEGATIVE Final   Influenza B by PCR NEGATIVE NEGATIVE Final    Comment: (NOTE) The Xpert Xpress SARS-CoV-2/FLU/RSV plus assay is intended as an aid in the diagnosis of influenza from Nasopharyngeal swab specimens and should not be used as a sole basis for treatment. Nasal washings and aspirates are unacceptable for Xpert Xpress SARS-CoV-2/FLU/RSV testing.  Fact Sheet for Patients: EntrepreneurPulse.com.au  Fact Sheet for Healthcare Providers: IncredibleEmployment.be  This test is not yet  approved or cleared by the [place] FDA and has been authorized for detection and/or diagnosis of SARS-CoV-2 by FDA under an Emergency Use Authorization (EUA). This EUA will remain in effect (meaning this test can be used) for the duration of the COVID-19 declaration under Section 564(b)(1) of the Act, 21 U.S.C. section 360bbb-3(b)(1), unless the authorization is terminated or revoked.  Performed at [facility], [place] [address], [place], [place] [address]   Blood Culture (routine x 2)     Status: Abnormal   Collection Time: 01/29/22  2:30 AM   Specimen: BLOOD LEFT ARM  Result Value Ref Range Status   Specimen Description BLOOD LEFT ARM  Final   Special Requests   Final    BOTTLES DRAWN AEROBIC AND ANAEROBIC Blood Culture results may not be optimal due to an excessive volume of blood received in culture bottles   Culture  Setup Time   Final    GRAM POSITIVE COCCI IN CLUSTERS IN BOTH AEROBIC AND ANAEROBIC BOTTLES CRITICAL RESULT CALLED TO, READ BACK BY AND VERIFIED WITH: PHARMD M.WEISKOPH AT 4235 ON 01/29/2022 BY T.SAAD. Performed at [facility], [place] [address], [place], [place] [address]    Culture METHICILLIN RESISTANT STAPHYLOCOCCUS AUREUS (A)  Final   Report Status 01/31/2022 FINAL  Final   Organism ID, Bacteria METHICILLIN RESISTANT STAPHYLOCOCCUS AUREUS  Final      Susceptibility   Methicillin resistant staphylococcus aureus - MIC*    CIPROFLOXACIN <=0.5 SENSITIVE Sensitive     ERYTHROMYCIN >=8 RESISTANT Resistant     GENTAMICIN <=0.5 SENSITIVE Sensitive     OXACILLIN >=4 RESISTANT Resistant     TETRACYCLINE <=1 SENSITIVE Sensitive     VANCOMYCIN 1 SENSITIVE Sensitive     TRIMETH/SULFA <=10 SENSITIVE Sensitive     CLINDAMYCIN <=0.25 SENSITIVE Sensitive     RIFAMPIN <=0.5 SENSITIVE Sensitive     Inducible Clindamycin NEGATIVE Sensitive     * METHICILLIN RESISTANT STAPHYLOCOCCUS AUREUS  Blood Culture ID Panel (Reflexed)     Status: Abnormal   Collection  Time: 01/29/22  2:30 AM  Result Value Ref Range Status   Enterococcus faecalis NOT DETECTED NOT DETECTED Final   Enterococcus Faecium NOT DETECTED NOT DETECTED Final   Listeria monocytogenes NOT DETECTED NOT DETECTED Final   Staphylococcus species DETECTED (A) NOT DETECTED Final    Comment: CRITICAL RESULT CALLED TO, READ BACK BY AND VERIFIED WITH: PHARMD M.WEISKOPH AT 1449 ON 01/29/2022 BY T.SAAD.    Staphylococcus aureus (BCID) DETECTED (A) NOT DETECTED Final    Comment: Methicillin (oxacillin)-resistant Staphylococcus aureus (MRSA). MRSA is predictably resistant to beta-lactam antibiotics (except ceftaroline). Preferred therapy is vancomycin unless clinically contraindicated. Patient requires contact precautions if  hospitalized. CRITICAL RESULT CALLED TO, READ BACK BY AND VERIFIED WITH: PHARMD M.WEISKOPH AT 1449 ON 01/29/2022 BY T.SAAD.    Staphylococcus epidermidis NOT DETECTED NOT DETECTED Final   Staphylococcus lugdunensis NOT DETECTED NOT DETECTED Final   Streptococcus species NOT DETECTED NOT DETECTED Final   Streptococcus agalactiae NOT DETECTED NOT DETECTED Final   Streptococcus pneumoniae NOT DETECTED NOT DETECTED Final   Streptococcus pyogenes NOT DETECTED NOT DETECTED Final   A.calcoaceticus-baumannii NOT DETECTED NOT DETECTED Final   Bacteroides fragilis NOT DETECTED NOT DETECTED Final   Enterobacterales NOT DETECTED NOT DETECTED Final   Enterobacter cloacae complex NOT DETECTED NOT DETECTED Final   Escherichia coli NOT DETECTED NOT DETECTED Final   Klebsiella aerogenes NOT DETECTED NOT DETECTED Final   Klebsiella oxytoca NOT DETECTED NOT DETECTED Final   Klebsiella pneumoniae NOT DETECTED NOT DETECTED Final   Proteus species NOT DETECTED NOT DETECTED Final   Salmonella species NOT DETECTED NOT DETECTED Final   Serratia marcescens NOT DETECTED NOT DETECTED Final   Haemophilus influenzae NOT DETECTED NOT DETECTED Final   Neisseria meningitidis NOT DETECTED NOT DETECTED  Final   Pseudomonas aeruginosa NOT DETECTED NOT DETECTED Final   Stenotrophomonas maltophilia NOT DETECTED NOT DETECTED Final   Candida albicans NOT DETECTED NOT DETECTED Final   Candida auris NOT DETECTED NOT DETECTED Final   Candida glabrata NOT DETECTED NOT DETECTED Final   Candida krusei NOT DETECTED NOT DETECTED Final   Candida parapsilosis NOT DETECTED NOT DETECTED Final   Candida tropicalis NOT DETECTED NOT DETECTED Final   Cryptococcus neoformans/gattii NOT DETECTED NOT DETECTED Final   Meth resistant mecA/C and MREJ DETECTED (A) NOT DETECTED Final    Comment: CRITICAL RESULT CALLED TO, READ BACK BY AND VERIFIED WITH: PHARMD M.WEISKOPH AT 1449 ON 01/29/2022 BY T.SAAD. Performed at [facility], [place] [address] [place], [place] [address]   Blood Culture (routine x 2)     Status: Abnormal   Collection Time: 01/29/22  2:40 AM   Specimen: BLOOD LEFT HAND  Result Value Ref Range Status   Specimen Description BLOOD LEFT HAND  Final   Special Requests   Final    BOTTLES DRAWN AEROBIC ONLY Blood Culture results may not be optimal due to an excessive volume of blood received in culture bottles   Culture  Setup Time   Final    GRAM POSITIVE COCCI IN CLUSTERS AEROBIC BOTTLE ONLY CRITICAL VALUE NOTED.  VALUE IS CONSISTENT WITH PREVIOUSLY REPORTED AND CALLED VALUE.    Culture (A)  Final    STAPHYLOCOCCUS AUREUS SUSCEPTIBILITIES PERFORMED ON PREVIOUS CULTURE WITHIN THE LAST 5 DAYS. Performed at [facility], [place] [address] [place], [place] [address]    Report Status 01/31/2022 FINAL  Final  Culture, blood (single)     Status: Abnormal   Collection Time: 01/29/22  5:56 AM   Specimen: BLOOD  Result Value  Ref Range Status   Specimen Description BLOOD CENTRAL LINE  Final   Special Requests   Final    BOTTLES DRAWN AEROBIC AND ANAEROBIC Blood Culture adequate volume   Culture  Setup Time   Final    GRAM POSITIVE COCCI IN CLUSTERS IN BOTH AEROBIC AND ANAEROBIC  BOTTLES CRITICAL VALUE NOTED.  VALUE IS CONSISTENT WITH PREVIOUSLY REPORTED AND CALLED VALUE.    Culture (A)  Final    STAPHYLOCOCCUS AUREUS SUSCEPTIBILITIES PERFORMED ON PREVIOUS CULTURE WITHIN THE LAST 5 DAYS. Performed at [facility], [place] [address] [place], [place] [address]    Report Status 01/31/2022 FINAL  Final  Culture, blood (routine x 2)     Status: None (Preliminary result)   Collection Time: 01/30/22  4:32 AM   Specimen: BLOOD  Result Value Ref Range Status   Specimen Description BLOOD LEFT ANTECUBITAL  Final   Special Requests   Final    BOTTLES DRAWN AEROBIC AND ANAEROBIC Blood Culture adequate volume   Culture   Final    NO GROWTH 1 DAY Performed at [facility], [place] [address], [place], [place] [address]    Report Status PENDING  Incomplete  Culture, blood (routine x 2)     Status: None (Preliminary result)   Collection Time: 01/30/22  4:45 AM   Specimen: BLOOD  Result Value Ref Range Status   Specimen Description BLOOD BLOOD RIGHT WRIST  Final   Special Requests   Final    BOTTLES DRAWN AEROBIC AND ANAEROBIC Blood Culture adequate volume   Culture   Final    NO GROWTH 1 DAY Performed at [facility], [place] [address], [place], [place] [address]    Report Status PENDING  Incomplete    RADIOLOGY STUDIES/RESULTS: IR Removal Tun Cv Cath W/O FL  Result Date: 01/30/2022 INDICATION: Patient with end-stage renal disease on HD via right IJ tunneled dialysis catheter placed 06/21/2021 by Dr. Pascal Lux. Patient admitted with line infection, bacteremia. Request made for tunneled dialysis catheter removal EXAM: REMOVAL TUNNELED CENTRAL VENOUS CATHETER MEDICATIONS: 12 mL 1% lidocaine. ANESTHESIA/SEDATION: None FLUOROSCOPY: None COMPLICATIONS: None immediate. PROCEDURE: Informed written consent was obtained from the patient after a thorough discussion of the procedural risks, benefits and alternatives. All questions were addressed. Maximal Sterile Barrier  Technique was utilized including caps, mask, sterile gowns, sterile gloves, sterile drape, hand hygiene and skin antiseptic. A timeout was performed prior to the initiation of the procedure. The patient's right chest and catheter was prepped and draped in a normal sterile fashion. Heparin was removed from both ports of catheter. 1% lidocaine was used for local anesthesia. Using gentle blunt dissection the cuff of the catheter was exposed and the catheter was removed in its entirety. Bloody pus flowed from tract upon removal of catheter. Pressure was held till hemostasis was obtained. A sterile dressing was applied. The patient tolerated the procedure well with no immediate complications. Tip was not sent for culture as it was not requested and patient has blood cultures pending. IMPRESSION: Successful catheter removal as described above. Read by: Brynda Greathouse PA-C Electronically Signed   By: Ruthann Cancer M.D.   On: 01/30/2022 07:35   ECHOCARDIOGRAM COMPLETE  Result Date: 01/30/2022    ECHOCARDIOGRAM REPORT   Patient Name:   CARLYON NOLASCO Date of Exam: 01/30/2022 Medical Rec #:  [id number]        Height:       67.0 in Accession #:    [id number]  Weight:       140.0 lb Date of Birth:  02/28/1981       BSA:          1.738 m Patient Age:    23 years         BP:           117/91 mmHg Patient Gender: F                HR:           74 bpm. Exam Location:  Inpatient Procedure: 2D Echo Indications:    Bacteremia  History:        Patient has prior history of Echocardiogram examinations, most                 recent 01/06/2022. Pericardial Disease; Signs/Symptoms:Shortness                 of Breath.  Sonographer:    Arlyss Gandy Referring Phys: [id number] Martha  1. Left ventricular ejection fraction, by estimation, is 35 to 40%. The left ventricle has mildly decreased function. The left ventricle demonstrates global hypokinesis. There is moderate concentric left ventricular hypertrophy. The  appearance of the myocardium is concerning for possible infiltrative cardiomypathy - consider further work-up for amyloid. Left ventricular diastolic parameters are consistent with Grade III diastolic dysfunction (restrictive). Elevated left ventricular end-diastolic pressure. The E/e' is 10.  2. Right ventricular systolic function is mildly reduced. The right ventricular size is normal. There is normal pulmonary artery systolic pressure. The estimated right ventricular systolic pressure is 19.5 mmHg.  3. Left atrial size was mildly dilated.  4. Right atrial size was moderately dilated.  5. The pericardial effusion is posterior to the left ventricle.  6. The mitral valve is abnormal. Mild mitral valve regurgitation.  7. The tricuspid valve is abnormal. Tricuspid valve regurgitation is moderate to severe.  8. The aortic valve is tricuspid. Aortic valve regurgitation is trivial. Aortic valve sclerosis is present, with no evidence of aortic valve stenosis.  9. Mildly dilated pulmonary artery. Comparison(s): Changes from prior study are noted. 01/06/2022: LVEF 20-25%. Conclusion(s)/Recommendation(s): Although there is valve thickening, there is No evidence of valvular vegetation on this transthoracic echocardiogram. Consider a transesophageal echocardiogram to exclude infective endocarditis if clinically indicated. FINDINGS  Left Ventricle: Left ventricular ejection fraction, by estimation, is 35 to 40%. The left ventricle has moderately decreased function. The left ventricle demonstrates global hypokinesis. The left ventricular internal cavity size was normal in size. There is moderate concentric left ventricular hypertrophy. Left ventricular diastolic parameters are consistent with Grade III diastolic dysfunction (restrictive). Elevated left ventricular end-diastolic pressure. The E/e' is 46. Right Ventricle: The right ventricular size is normal. No increase in right ventricular wall thickness. Right ventricular  systolic function is mildly reduced. There is normal pulmonary artery systolic pressure. The tricuspid regurgitant velocity is 1.16 m/s, and with an assumed right atrial pressure of 15 mmHg, the estimated right ventricular systolic pressure is 09.3 mmHg. Left Atrium: Left atrial size was mildly dilated. Right Atrium: Right atrial size was moderately dilated. Pericardium: Trivial pericardial effusion is present. The pericardial effusion is posterior to the left ventricle. Mitral Valve: The mitral valve is abnormal. There is moderate thickening of the anterior and posterior mitral valve leaflet(s). Mild mitral valve regurgitation. Tricuspid Valve: The tricuspid valve is abnormal. Tricuspid valve regurgitation is moderate to severe. Aortic Valve: The aortic valve is tricuspid. Aortic valve regurgitation is trivial. Aortic valve sclerosis is present,  with no evidence of aortic valve stenosis. Aortic valve mean gradient measures 4.0 mmHg. Aortic valve peak gradient measures 8.0 mmHg. Aortic valve area, by VTI measures 2.51 cm. Pulmonic Valve: The pulmonic valve was grossly normal. Pulmonic valve regurgitation is mild. Aorta: The aortic root and ascending aorta are structurally normal, with no evidence of dilitation. Pulmonary Artery: The pulmonary artery is mildly dilated. IAS/Shunts: There is left bowing of the interatrial septum, suggestive of elevated right atrial pressure. No atrial level shunt detected by color flow Doppler.  LEFT VENTRICLE PLAX 2D LVIDd:         4.80 cm   Diastology LVIDs:         4.00 cm   LV e' medial:    4.57 cm/s LV PW:         1.20 cm   LV E/e' medial:  33.5 LV IVS:        1.20 cm   LV e' lateral:   6.31 cm/s LVOT diam:     2.00 cm   LV E/e' lateral: 24.2 LV SV:         61 LV SV Index:   35 LVOT Area:     3.14 cm  RIGHT VENTRICLE            IVC RV Basal diam:  4.40 cm    IVC diam: 3.10 cm RV Mid diam:    3.50 cm RV S prime:     7.29 cm/s TAPSE (M-mode): 1.1 cm LEFT ATRIUM           Index         RIGHT ATRIUM           Index LA diam:      3.80 cm 2.19 cm/m   RA Area:     23.40 cm LA Vol (A2C): 63.4 ml 36.48 ml/m  RA Volume:   73.20 ml  42.12 ml/m LA Vol (A4C): 37.2 ml 21.41 ml/m  AORTIC VALVE AV Area (Vmax):    2.72 cm AV Area (Vmean):   2.53 cm AV Area (VTI):     2.51 cm AV Vmax:           141.00 cm/s AV Vmean:          93.000 cm/s AV VTI:            0.243 m AV Peak Grad:      8.0 mmHg AV Mean Grad:      4.0 mmHg LVOT Vmax:         122.00 cm/s LVOT Vmean:        74.900 cm/s LVOT VTI:          0.194 m LVOT/AV VTI ratio: 0.80  AORTA Ao Root diam: 2.80 cm Ao Asc diam:  2.80 cm MITRAL VALVE                TRICUSPID VALVE MV Area (PHT): 5.84 cm     TR Peak grad:   5.4 mmHg MV Decel Time: 130 msec     TR Vmax:        116.00 cm/s MV E velocity: 153.00 cm/s MV A velocity: 50.10 cm/s   SHUNTS MV E/A ratio:  3.05         Systemic VTI:  0.19 m                             Systemic Diam: 2.00 cm Lyman Bishop MD Electronically signed  by Lyman Bishop MD Signature Date/Time: 01/30/2022/1:12:26 PM    Final      LOS: 2 days   Oren Binet, MD  [facility]    To contact the attending provider between 7A-7P or the covering provider during after hours 7P-7A, please log into the web site [email or web address] and access using universal [facility] password for that web site. If you do not have the password, please call the hospital operator.  01/31/2022, 2:06 PM

## 2022-02-01 ENCOUNTER — Other Ambulatory Visit (HOSPITAL_COMMUNITY): Payer: Self-pay

## 2022-02-01 ENCOUNTER — Inpatient Hospital Stay (HOSPITAL_COMMUNITY): Payer: Medicare Other

## 2022-02-01 DIAGNOSIS — N186 End stage renal disease: Secondary | ICD-10-CM

## 2022-02-01 DIAGNOSIS — R7881 Bacteremia: Secondary | ICD-10-CM | POA: Diagnosis not present

## 2022-02-01 DIAGNOSIS — M3214 Glomerular disease in systemic lupus erythematosus: Secondary | ICD-10-CM | POA: Diagnosis not present

## 2022-02-01 DIAGNOSIS — I2699 Other pulmonary embolism without acute cor pulmonale: Secondary | ICD-10-CM | POA: Diagnosis not present

## 2022-02-01 HISTORY — PX: IR FLUORO GUIDE CV LINE RIGHT: IMG2283

## 2022-02-01 HISTORY — PX: IR PERC TUN PERIT CATH WO PORT S&I /IMAG: IMG2327

## 2022-02-01 LAB — RENAL FUNCTION PANEL
Albumin: 2.6 g/dL — ABNORMAL LOW (ref 3.5–5.0)
Anion gap: 16 — ABNORMAL HIGH (ref 5–15)
BUN: 82 mg/dL — ABNORMAL HIGH (ref 6–20)
CO2: 21 mmol/L — ABNORMAL LOW (ref 22–32)
Calcium: 8.1 mg/dL — ABNORMAL LOW (ref 8.9–10.3)
Chloride: 93 mmol/L — ABNORMAL LOW (ref 98–111)
Creatinine, Ser: 7.71 mg/dL — ABNORMAL HIGH (ref 0.44–1.00)
GFR, Estimated: 6 mL/min — ABNORMAL LOW (ref >60)
Glucose, Bld: 104 mg/dL — ABNORMAL HIGH (ref 70–99)
Phosphorus: 4 mg/dL (ref 2.5–4.6)
Potassium: 5.7 mmol/L — ABNORMAL HIGH (ref 3.5–5.1)
Sodium: 130 mmol/L — ABNORMAL LOW (ref 135–145)

## 2022-02-01 LAB — CBC
HCT: 29.2 % — ABNORMAL LOW (ref 36.0–46.0)
Hemoglobin: 9.2 g/dL — ABNORMAL LOW (ref 12.0–15.0)
MCH: 28.9 pg (ref 26.0–34.0)
MCHC: 31.5 g/dL (ref 30.0–36.0)
MCV: 91.8 fL (ref 80.0–100.0)
Platelets: 137 10*3/uL — ABNORMAL LOW (ref 150–400)
RBC: 3.18 MIL/uL — ABNORMAL LOW (ref 3.87–5.11)
RDW: 21.4 % — ABNORMAL HIGH (ref 11.5–15.5)
WBC: 11.7 10*3/uL — ABNORMAL HIGH (ref 4.0–10.5)
nRBC: 0.3 % — ABNORMAL HIGH (ref 0.0–0.2)

## 2022-02-01 LAB — HEPATITIS B CORE ANTIBODY, TOTAL: Hep B Core Total Ab: NONREACTIVE

## 2022-02-01 LAB — POTASSIUM: Potassium: 5.1 mmol/L (ref 3.5–5.1)

## 2022-02-01 LAB — HEPATITIS B SURFACE ANTIGEN: Hepatitis B Surface Ag: NONREACTIVE

## 2022-02-01 MED ORDER — HEPARIN SODIUM (PORCINE) 1000 UNIT/ML IJ SOLN
INTRAMUSCULAR | Status: AC
Start: 2022-02-01 — End: 2022-02-01
  Filled 2022-02-01: qty 10

## 2022-02-01 MED ORDER — LIDOCAINE HCL 1 % IJ SOLN
INTRAMUSCULAR | Status: AC
Start: 2022-02-01 — End: 2022-02-01
  Filled 2022-02-01: qty 20

## 2022-02-01 MED ORDER — SODIUM ZIRCONIUM CYCLOSILICATE 10 G PO PACK
10.0000 g | PACK | Freq: Once | ORAL | Status: AC
  Administered 2022-02-01: 10 g via ORAL
  Filled 2022-02-01: qty 1

## 2022-02-01 MED ORDER — MIDAZOLAM HCL 2 MG/2ML IJ SOLN
INTRAMUSCULAR | Status: AC | PRN
  Administered 2022-02-01 (×2): .5 mg via INTRAVENOUS
  Administered 2022-02-01: 1 mg via INTRAVENOUS
  Administered 2022-02-01: .5 mg via INTRAVENOUS

## 2022-02-01 MED ORDER — HEPARIN SODIUM (PORCINE) 1000 UNIT/ML IJ SOLN
INTRAMUSCULAR | Status: AC
  Administered 2022-02-01: 1000 [IU]
  Filled 2022-02-01: qty 4

## 2022-02-01 MED ORDER — FENTANYL CITRATE (PF) 100 MCG/2ML IJ SOLN
INTRAMUSCULAR | Status: AC
Start: 2022-02-01 — End: 2022-02-01
  Filled 2022-02-01: qty 2

## 2022-02-01 MED ORDER — FENTANYL CITRATE (PF) 100 MCG/2ML IJ SOLN
INTRAMUSCULAR | Status: AC | PRN
  Administered 2022-02-01 (×4): 25 ug via INTRAVENOUS

## 2022-02-01 MED ORDER — MIDAZOLAM HCL 2 MG/2ML IJ SOLN
INTRAMUSCULAR | Status: AC
  Filled 2022-02-01: qty 2

## 2022-02-01 MED ORDER — VANCOMYCIN HCL 750 MG/150ML IV SOLN
750.0000 mg | INTRAVENOUS | Status: AC | PRN
  Administered 2022-02-01: 750 mg via INTRAVENOUS
  Filled 2022-02-01: qty 150

## 2022-02-01 NOTE — Progress Notes (Signed)
Pharmacy Antibiotic Note ? ?Cathy [name] is a 41 y.o. 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   Pharmacy has been consulted for Vancomycin dosing. Patient is ESRD with normal HD days TThSa. Last HD session 3/1 and catheter was removed post HD. Catheter was replaced this morning after cultures showed no growth for 48 hrs, and pt is getting HD this afternoon. Vascular is following to evaluate access via fistula for HD.   ? ?Cultures:  ?3/1 > MRSA ?3/2> NGTD ? ?Plan: ?Vancomycin 750 mg x 1 post HD session (3/4)  ?Monitor for further HD sessions and vancomycin doses ?Monitor blood cultures  ?F/u plans for long term dialysis access (line or fistula)  ? ? ?Height: 5\' 7"  (170.2 cm) ?Weight: 63.5 kg (139 lb 15.9 oz) ?IBW/kg (Calculated) : 61.6 ? ?Temp (24hrs), Avg:98.1 ?F (36.7 ?C), Min:97.7 ?F (36.5 ?C), Max:98.4 ?F (36.9 ?C) ? ?Recent Labs  ?Lab 01/29/22 ?0246 01/29/22 ?7989 01/30/22 ?2119 01/31/22 ?0202 02/01/22 ?0100  ?WBC 15.4*  --  11.8* 13.2* 11.7*  ?CREATININE 9.33*  --  5.87* 6.91* 7.71*  ?LATICACIDVEN 3.0* 3.1*  --   --   --   ? ?  ?Estimated Creatinine Clearance: 9.6 mL/min (A) (by C-G formula based on SCr of 7.71 mg/dL (H)).   ? ?No Known Allergies ? ?Marzella Schlein [place], Student Pharmacist  ?02/01/2022 2:36 PM ? ? ? ?

## 2022-02-01 NOTE — Progress Notes (Addendum)
PROGRESS NOTE        PATIENT DETAILS Name: Cathy [name] Age: 41 y.o. Sex: female Date of Birth: Mar 21, 1981 Admit Date: 01/29/2022 Admitting Physician Christel Mormon, MD PCP:Pcp, No  Brief Summary: Patient is a 41 y.o.  female with history of ESRD, lupus, HTN, HLD-history of chronic under dialysis-numerous recent hospitalizations-presented with fever-found to have MRSA bacteremia.  Significant Hospital events: 1/13-1/17>> RLL pneumonia, large parapneumonic effusion, s/p chest tube and cefepime  1/26-1/30>> RML pneumonia, pericarditis/ effusion (? Lupus vs uremic), and lupus flare 2/5/-2/9>> hypertensive urgency, PE, EF down to 20-25% 3/1>> admit for fever-found to have MRSA bacteremia. 3/1>> tunneled HD catheter removed after dialysis. 3/4>> tunneled HD catheter placed by IR  Significant imaging studies: 3/1>> CXR: Small bilateral pleural effusions. 3/2>> Echo: No obvious vegetations, EF 35-40%.  Grade 3 diastolic dysfunction.  Trivial pericardial effusion.  Significant microbiology data: 3/1>> COVID/influenza PCR: Positive 3/1>> blood culture: MRSA 3/2>> blood culture: Negative.  Procedures: 3/1>> removal of tunneled dialysis catheter by IR. 3/4>> tunneled dialysis catheter placement by IR.  Consults:  ID, IR, nephrology  Subjective: Still weak but feels overall better.  Prior [facility] catheter site less painful.  Objective: Vitals: Blood pressure (!) 124/93, pulse (!) 57, temperature 97.7 F (36.5 C), temperature source Oral, resp. rate 16, height 5\' 7"  (1.702 m), weight 63.5 kg, SpO2 95 %, unknown if currently breastfeeding.   Exam: Gen Exam:Alert awake-not in any distress HEENT:atraumatic, normocephalic Chest: B/L clear to auscultation anteriorly CVS:S1S2 regular Abdomen:soft non tender, non distended Extremities:no edema Neurology: Non focal Skin: no rash   Pertinent Labs/Radiology: CBC Latest Ref Rng & Units 02/01/2022 01/31/2022 01/30/2022   WBC 4.0 - 10.5 K/uL 11.7(H) 13.2(H) 11.8(H)  Hemoglobin 12.0 - 15.0 g/dL 9.2(L) 9.3(L) 10.0(L)  Hematocrit 36.0 - 46.0 % 29.2(L) 29.8(L) 32.1(L)  Platelets 150 - 400 K/uL 137(L) 136(L) 155    Lab Results  Component Value Date   NA 130 (L) 02/01/2022   K 5.1 02/01/2022   CL 93 (L) 02/01/2022   CO2 21 (L) 02/01/2022      Assessment/Plan: SIRS due to MRSA bacteremia: Clinically improved-no leukocytosis-SIRS physiology has resolved-repeat blood cultures on 3/2 negative so far.  Continue vancomycin-TEE scheduled for 3/6.  Given line holiday-for 48 hours-since blood cultures remain negative-TDC placed by IR today.  ESRD on HD TTS: Nephrology following.  Longstanding history of under dialysis/noncompliance.  Hyperkalemia: Given Lokelma earlier this morning-plans are for HD once [facility] is placed.  Normocytic anemia: Due to CKD-defer Aranesp/iron therapy to nephrology.  Chronic combined systolic and diastolic heart failure: Trace lower extremity edema but otherwise stable.  Diuresis with HD.  Will need cardiology input at some point in time when she is a bit more stable-echo findings suspicious for infiltrative cardiomyopathy.    Pericardial effusion: Appears to be a chronic issue-suspect related to poor compliance to dialysis/under dialysis.  Echo on 3/2 showed small pericardial effusion.  Pulmonary embolism: Remains on Eliquis.  HTN: BP stable-continue amlodipine, Coreg, clonidine  History of lupus: Appears stable-continue Plaquenil and prednisone  Elevated point-of-care beta-hCG: Likely false positive elevation-Per patient-she has not had her menstrual period for close to a year-and she has not engage in sexual intercourse for "years".  Formal beta hCG not elevated.  Tobacco abuse: Encourage cessation-on transdermal nicotine.  Debility/deconditioning: Some of this is chronic-likely worsened by MRSA bacteremia.  Mobilize  with rehab services and see how she does.  BMI: Estimated body  mass index is 21.93 kg/m as calculated from the following:   Height as of this encounter: 5\' 7"  (1.702 m).   Weight as of this encounter: 63.5 kg.   Code status:   Code Status: Full Code   DVT Prophylaxis: apixaban (ELIQUIS) tablet 5 mg    Family Communication: None at bedside  Disposition Plan: Status is: Inpatient Remains inpatient appropriate because: MRSA bacteremia-on IV vancomycin-work-up in progress-not yet stable for discharge.   Planned Discharge Destination:Home   Diet: Diet Order             Diet NPO time specified Except for: Sips with Meds  Diet effective midnight           Diet renal with fluid restriction Fluid restriction: 1200 mL Fluid; Room service appropriate? Yes; Fluid consistency: Thin  Diet effective now                     Antimicrobial agents: Anti-infectives (From admission, onward)    Start     Dose/Rate Route Frequency Ordered Stop   02/01/22 0000  ceFAZolin (ANCEF) IVPB 2g/100 mL premix       Note to Pharmacy: Pls contact [place] or Jannifer Franklin if you have question over the weekend.   2 g 200 mL/hr over 30 Minutes Intravenous To Radiology 01/31/22 1555 02/01/22 1239   01/30/22 2200  ceFEPIme (MAXIPIME) 1 g in sodium chloride 0.9 % 100 mL IVPB  Status:  Discontinued        1 g 200 mL/hr over 30 Minutes Intravenous Every 24 hours 01/29/22 0606 01/29/22 1453   01/30/22 1200  vancomycin (VANCOCIN) 750 mg in sodium chloride 0.9 % 250 mL IVPB  Status:  Discontinued        750 mg 250 mL/hr over 60 Minutes Intravenous Every T-Th-Sa (Hemodialysis) 01/29/22 0606 01/30/22 0806   01/30/22 1000  hydroxychloroquine (PLAQUENIL) tablet 100 mg        100 mg Oral Every 48 hours 01/29/22 0904     01/30/22 0900  vancomycin (VANCOREADY) IVPB 750 mg/150 mL        750 mg 150 mL/hr over 60 Minutes Intravenous  Once 01/30/22 0806 01/30/22 1056   01/30/22 0806  vancomycin variable dose per unstable renal function (pharmacist dosing)         Does not  apply See admin instructions 01/30/22 0806     01/29/22 0615  ceFEPIme (MAXIPIME) 2 g in sodium chloride 0.9 % 100 mL IVPB        2 g 200 mL/hr over 30 Minutes Intravenous  Once 01/29/22 0600 01/29/22 0722   01/29/22 0615  metroNIDAZOLE (FLAGYL) IVPB 500 mg        500 mg 100 mL/hr over 60 Minutes Intravenous  Once 01/29/22 0600 01/29/22 0903   01/29/22 0615  vancomycin (VANCOCIN) IVPB 1000 mg/200 mL premix  Status:  Discontinued        1,000 mg 200 mL/hr over 60 Minutes Intravenous  Once 01/29/22 0600 01/29/22 0604   01/29/22 0615  Vancomycin (VANCOCIN) 1,250 mg in sodium chloride 0.9 % 250 mL IVPB        1,250 mg 166.7 mL/hr over 90 Minutes Intravenous  Once 01/29/22 0604 01/29/22 0934        MEDICATIONS: Scheduled Meds:  amLODipine  10 mg Oral Daily   apixaban  5 mg Oral BID   calcium acetate  1,334 mg Oral  TID WC   carvedilol  12.5 mg Oral BID WC   Chlorhexidine Gluconate Cloth  6 each Topical Q0600   cloNIDine  0.3 mg Oral TID   colchicine  0.3 mg Oral Daily   heparin sodium (porcine)       hydrALAZINE  100 mg Oral TID   hydroxychloroquine  100 mg Oral Q48H   lidocaine       nicotine  14 mg Transdermal Daily   predniSONE  40 mg Oral Q breakfast   sodium chloride flush  3 mL Intravenous Q12H   sodium zirconium cyclosilicate  10 g Oral Daily   vancomycin variable dose per unstable renal function (pharmacist dosing)   Does not apply See admin instructions   Continuous Infusions: PRN Meds:.acetaminophen **OR** acetaminophen, albuterol, calcium carbonate (dosed in mg elemental calcium), docusate sodium, feeding supplement (NEPRO CARB STEADY), HYDROmorphone (DILAUDID) injection, hydrOXYzine, methocarbamol, ondansetron **OR** ondansetron (ZOFRAN) IV, oxyCODONE, sorbitol, zolpidem   I have personally reviewed following labs and imaging studies  LABORATORY DATA: CBC: Recent Labs  Lab 01/29/22 0246 01/30/22 0432 01/31/22 0202 02/01/22 0100  WBC 15.4* 11.8* 13.2* 11.7*   NEUTROABS 14.2*  --   --   --   HGB 10.9* 10.0* 9.3* 9.2*  HCT 35.1* 32.1* 29.8* 29.2*  MCV 93.9 91.7 91.7 91.8  PLT 176 155 136* 137*     Basic Metabolic Panel: Recent Labs  Lab 01/29/22 0246 01/30/22 0432 01/31/22 0202 02/01/22 0100 02/01/22 1121  NA 136 134* 130* 130*  --   K 5.9* 4.7 4.9 5.7* 5.1  CL 96* 95* 93* 93*  --   CO2 22 25 23  21*  --   GLUCOSE 93 89 118* 104*  --   BUN 85* 42* 58* 82*  --   CREATININE 9.33* 5.87* 6.91* 7.71*  --   CALCIUM 8.6* 7.4* 7.7* 8.1*  --   PHOS  --   --  5.1* 4.0  --      GFR: Estimated Creatinine Clearance: 9.6 mL/min (A) (by C-G formula based on SCr of 7.71 mg/dL (H)).  Liver Function Tests: Recent Labs  Lab 01/29/22 0246 01/31/22 0202 02/01/22 0100  AST 30  --   --   ALT 44  --   --   ALKPHOS 192*  --   --   BILITOT 0.9  --   --   PROT 6.2*  --   --   ALBUMIN 2.8* 2.5* 2.6*    No results for input(s): LIPASE, AMYLASE in the last 168 hours. No results for input(s): AMMONIA in the last 168 hours.  Coagulation Profile: Recent Labs  Lab 01/29/22 0246  INR 2.1*     Cardiac Enzymes: No results for input(s): CKTOTAL, CKMB, CKMBINDEX, TROPONINI in the last 168 hours.  BNP (last 3 results) No results for input(s): PROBNP in the last 8760 hours.  Lipid Profile: No results for input(s): CHOL, HDL, LDLCALC, TRIG, CHOLHDL, LDLDIRECT in the last 72 hours.  Thyroid Function Tests: No results for input(s): TSH, T4TOTAL, FREET4, T3FREE, THYROIDAB in the last 72 hours.  Anemia Panel: No results for input(s): VITAMINB12, FOLATE, FERRITIN, TIBC, IRON, RETICCTPCT in the last 72 hours.  Urine analysis:    Component Value Date/Time   COLORURINE YELLOW 11/29/2020 2054   APPEARANCEUR HAZY (A) 11/29/2020 2054   LABSPEC 1.015 11/29/2020 2054   PHURINE 6.0 11/29/2020 2054   GLUCOSEU NEGATIVE 11/29/2020 2054   HGBUR MODERATE (A) 11/29/2020 2054   BILIRUBINUR NEGATIVE 11/29/2020 2054   Benjamin Stain  NEGATIVE 11/29/2020 2054    PROTEINUR >=300 (A) 11/29/2020 2054   UROBILINOGEN 0.2 10/17/2020 1026   NITRITE NEGATIVE 11/29/2020 2054   LEUKOCYTESUR NEGATIVE 11/29/2020 2054    Sepsis Labs: Lactic Acid, Venous    Component Value Date/Time   LATICACIDVEN 3.1 (HH) 01/29/2022 0748    MICROBIOLOGY: Recent Results (from the past 240 hour(s))  Resp Panel by RT-PCR (Flu A&B, Covid) Nasopharyngeal Swab     Status: None   Collection Time: 01/29/22  2:18 AM   Specimen: Nasopharyngeal Swab; Nasopharyngeal(NP) swabs in vial transport medium  Result Value Ref Range Status   SARS Coronavirus 2 by RT PCR NEGATIVE NEGATIVE Final    Comment: (NOTE) SARS-CoV-2 target nucleic acids are NOT DETECTED.  The SARS-CoV-2 RNA is generally detectable in upper respiratory specimens during the acute phase of infection. The lowest concentration of SARS-CoV-2 viral copies this assay can detect is 138 copies/mL. A negative result does not preclude SARS-Cov-2 infection and should not be used as the sole basis for treatment or other patient management decisions. A negative result may occur with  improper specimen collection/handling, submission of specimen other than nasopharyngeal swab, presence of viral mutation(s) within the areas targeted by this assay, and inadequate number of viral copies(<138 copies/mL). A negative result must be combined with clinical observations, patient history, and epidemiological information. The expected result is Negative.  Fact Sheet for Patients:  EntrepreneurPulse.com.au  Fact Sheet for Healthcare Providers:  IncredibleEmployment.be  This test is no t yet approved or cleared by the [place] FDA and  has been authorized for detection and/or diagnosis of SARS-CoV-2 by FDA under an Emergency Use Authorization (EUA). This EUA will remain  in effect (meaning this test can be used) for the duration of the COVID-19 declaration under Section 564(b)(1) of the Act,  21 U.S.C.section 360bbb-3(b)(1), unless the authorization is terminated  or revoked sooner.       Influenza A by PCR NEGATIVE NEGATIVE Final   Influenza B by PCR NEGATIVE NEGATIVE Final    Comment: (NOTE) The Xpert Xpress SARS-CoV-2/FLU/RSV plus assay is intended as an aid in the diagnosis of influenza from Nasopharyngeal swab specimens and should not be used as a sole basis for treatment. Nasal washings and aspirates are unacceptable for Xpert Xpress SARS-CoV-2/FLU/RSV testing.  Fact Sheet for Patients: EntrepreneurPulse.com.au  Fact Sheet for Healthcare Providers: IncredibleEmployment.be  This test is not yet approved or cleared by the [place] FDA and has been authorized for detection and/or diagnosis of SARS-CoV-2 by FDA under an Emergency Use Authorization (EUA). This EUA will remain in effect (meaning this test can be used) for the duration of the COVID-19 declaration under Section 564(b)(1) of the Act, 21 U.S.C. section 360bbb-3(b)(1), unless the authorization is terminated or revoked.  Performed at [facility], [place] [address], [place], [place] [address]   Blood Culture (routine x 2)     Status: Abnormal   Collection Time: 01/29/22  2:30 AM   Specimen: BLOOD LEFT ARM  Result Value Ref Range Status   Specimen Description BLOOD LEFT ARM  Final   Special Requests   Final    BOTTLES DRAWN AEROBIC AND ANAEROBIC Blood Culture results may not be optimal due to an excessive volume of blood received in culture bottles   Culture  Setup Time   Final    GRAM POSITIVE COCCI IN CLUSTERS IN BOTH AEROBIC AND ANAEROBIC BOTTLES CRITICAL RESULT CALLED TO, READ BACK BY AND VERIFIED WITH: PHARMD M.WEISKOPH AT 8416 ON 01/29/2022  BY T.SAAD. Performed at [facility], [place] [address], [place], [place] [address]    Culture METHICILLIN RESISTANT STAPHYLOCOCCUS AUREUS (A)  Final   Report Status 01/31/2022 FINAL  Final   Organism ID,  Bacteria METHICILLIN RESISTANT STAPHYLOCOCCUS AUREUS  Final      Susceptibility   Methicillin resistant staphylococcus aureus - MIC*    CIPROFLOXACIN <=0.5 SENSITIVE Sensitive     ERYTHROMYCIN >=8 RESISTANT Resistant     GENTAMICIN <=0.5 SENSITIVE Sensitive     OXACILLIN >=4 RESISTANT Resistant     TETRACYCLINE <=1 SENSITIVE Sensitive     VANCOMYCIN 1 SENSITIVE Sensitive     TRIMETH/SULFA <=10 SENSITIVE Sensitive     CLINDAMYCIN <=0.25 SENSITIVE Sensitive     RIFAMPIN <=0.5 SENSITIVE Sensitive     Inducible Clindamycin NEGATIVE Sensitive     * METHICILLIN RESISTANT STAPHYLOCOCCUS AUREUS  Blood Culture ID Panel (Reflexed)     Status: Abnormal   Collection Time: 01/29/22  2:30 AM  Result Value Ref Range Status   Enterococcus faecalis NOT DETECTED NOT DETECTED Final   Enterococcus Faecium NOT DETECTED NOT DETECTED Final   Listeria monocytogenes NOT DETECTED NOT DETECTED Final   Staphylococcus species DETECTED (A) NOT DETECTED Final    Comment: CRITICAL RESULT CALLED TO, READ BACK BY AND VERIFIED WITH: PHARMD M.WEISKOPH AT 1449 ON 01/29/2022 BY T.SAAD.    Staphylococcus aureus (BCID) DETECTED (A) NOT DETECTED Final    Comment: Methicillin (oxacillin)-resistant Staphylococcus aureus (MRSA). MRSA is predictably resistant to beta-lactam antibiotics (except ceftaroline). Preferred therapy is vancomycin unless clinically contraindicated. Patient requires contact precautions if  hospitalized. CRITICAL RESULT CALLED TO, READ BACK BY AND VERIFIED WITH: PHARMD M.WEISKOPH AT 1449 ON 01/29/2022 BY T.SAAD.    Staphylococcus epidermidis NOT DETECTED NOT DETECTED Final   Staphylococcus lugdunensis NOT DETECTED NOT DETECTED Final   Streptococcus species NOT DETECTED NOT DETECTED Final   Streptococcus agalactiae NOT DETECTED NOT DETECTED Final   Streptococcus pneumoniae NOT DETECTED NOT DETECTED Final   Streptococcus pyogenes NOT DETECTED NOT DETECTED Final   A.calcoaceticus-baumannii NOT DETECTED  NOT DETECTED Final   Bacteroides fragilis NOT DETECTED NOT DETECTED Final   Enterobacterales NOT DETECTED NOT DETECTED Final   Enterobacter cloacae complex NOT DETECTED NOT DETECTED Final   Escherichia coli NOT DETECTED NOT DETECTED Final   Klebsiella aerogenes NOT DETECTED NOT DETECTED Final   Klebsiella oxytoca NOT DETECTED NOT DETECTED Final   Klebsiella pneumoniae NOT DETECTED NOT DETECTED Final   Proteus species NOT DETECTED NOT DETECTED Final   Salmonella species NOT DETECTED NOT DETECTED Final   Serratia marcescens NOT DETECTED NOT DETECTED Final   Haemophilus influenzae NOT DETECTED NOT DETECTED Final   Neisseria meningitidis NOT DETECTED NOT DETECTED Final   Pseudomonas aeruginosa NOT DETECTED NOT DETECTED Final   Stenotrophomonas maltophilia NOT DETECTED NOT DETECTED Final   Candida albicans NOT DETECTED NOT DETECTED Final   Candida auris NOT DETECTED NOT DETECTED Final   Candida glabrata NOT DETECTED NOT DETECTED Final   Candida krusei NOT DETECTED NOT DETECTED Final   Candida parapsilosis NOT DETECTED NOT DETECTED Final   Candida tropicalis NOT DETECTED NOT DETECTED Final   Cryptococcus neoformans/gattii NOT DETECTED NOT DETECTED Final   Meth resistant mecA/C and MREJ DETECTED (A) NOT DETECTED Final    Comment: CRITICAL RESULT CALLED TO, READ BACK BY AND VERIFIED WITH: PHARMD M.WEISKOPH AT 1449 ON 01/29/2022 BY T.SAAD. Performed at [facility], [place] [address] [place], [place] [address]   Blood Culture (routine x 2)  Status: Abnormal   Collection Time: 01/29/22  2:40 AM   Specimen: BLOOD LEFT HAND  Result Value Ref Range Status   Specimen Description BLOOD LEFT HAND  Final   Special Requests   Final    BOTTLES DRAWN AEROBIC ONLY Blood Culture results may not be optimal due to an excessive volume of blood received in culture bottles   Culture  Setup Time   Final    GRAM POSITIVE COCCI IN CLUSTERS AEROBIC BOTTLE ONLY CRITICAL VALUE NOTED.  VALUE IS CONSISTENT  WITH PREVIOUSLY REPORTED AND CALLED VALUE.    Culture (A)  Final    STAPHYLOCOCCUS AUREUS SUSCEPTIBILITIES PERFORMED ON PREVIOUS CULTURE WITHIN THE LAST 5 DAYS. Performed at [facility], [place] [address], [place], [place] [address]    Report Status 01/31/2022 FINAL  Final  Culture, blood (single)     Status: Abnormal   Collection Time: 01/29/22  5:56 AM   Specimen: BLOOD  Result Value Ref Range Status   Specimen Description BLOOD CENTRAL LINE  Final   Special Requests   Final    BOTTLES DRAWN AEROBIC AND ANAEROBIC Blood Culture adequate volume   Culture  Setup Time   Final    GRAM POSITIVE COCCI IN CLUSTERS IN BOTH AEROBIC AND ANAEROBIC BOTTLES CRITICAL VALUE NOTED.  VALUE IS CONSISTENT WITH PREVIOUSLY REPORTED AND CALLED VALUE.    Culture (A)  Final    STAPHYLOCOCCUS AUREUS SUSCEPTIBILITIES PERFORMED ON PREVIOUS CULTURE WITHIN THE LAST 5 DAYS. Performed at [facility], [place] [address] [place], [place] [address]    Report Status 01/31/2022 FINAL  Final  Culture, blood (routine x 2)     Status: None (Preliminary result)   Collection Time: 01/30/22  4:32 AM   Specimen: BLOOD  Result Value Ref Range Status   Specimen Description BLOOD LEFT ANTECUBITAL  Final   Special Requests   Final    BOTTLES DRAWN AEROBIC AND ANAEROBIC Blood Culture adequate volume   Culture   Final    NO GROWTH 2 DAYS Performed at [facility], [place] [address] [place], [place] [address]    Report Status PENDING  Incomplete  Culture, blood (routine x 2)     Status: None (Preliminary result)   Collection Time: 01/30/22  4:45 AM   Specimen: BLOOD  Result Value Ref Range Status   Specimen Description BLOOD BLOOD RIGHT WRIST  Final   Special Requests   Final    BOTTLES DRAWN AEROBIC AND ANAEROBIC Blood Culture adequate volume   Culture   Final    NO GROWTH 2 DAYS Performed at [facility], [place] [address], [place], [place] [address]    Report Status PENDING  Incomplete     RADIOLOGY STUDIES/RESULTS: VAS [place] UPPER EXT VEIN MAPPING (PRE-OP AVF)  Result Date: 02/01/2022 UPPER EXTREMITY VEIN MAPPING Patient Name:  NATALYAH CUMMISKEY  Date of Exam:   02/01/2022 Medical Rec #: [id number]         Accession #:    [id number] Date of Birth: 12-05-1980        Patient Gender: F Patient Age:   55 years Exam Location:  [facility] Procedure:      VAS [place] UPPER EXT VEIN MAPPING (PRE-OP AVF) Referring Phys: Aldona Bar RHYNE --------------------------------------------------------------------------------  Indications: Pre-access. Comparison Study: No prior studies. Performing Technologist: Oliver Hum RVT  Examination Guidelines: A complete evaluation includes B-mode imaging, spectral Doppler, color Doppler, and power Doppler as needed of all accessible portions of  each vessel. Bilateral testing is considered an integral part of a complete examination. Limited examinations for reoccurring indications may be performed as noted. +-----------------+-------------+----------+---------+  Right Cephalic    Diameter (cm) Depth (cm) Findings   +-----------------+-------------+----------+---------+  Shoulder              0.34         0.46               +-----------------+-------------+----------+---------+  Prox upper arm        0.35         0.23               +-----------------+-------------+----------+---------+  Mid upper arm         0.38         0.14               +-----------------+-------------+----------+---------+  Dist upper arm        0.38         0.13               +-----------------+-------------+----------+---------+  Antecubital fossa     0.44         0.15    branching  +-----------------+-------------+----------+---------+  Prox forearm          0.24         0.28    branching  +-----------------+-------------+----------+---------+  Mid forearm           0.06         0.14               +-----------------+-------------+----------+---------+  Dist forearm          0.06         0.12                +-----------------+-------------+----------+---------+ +-----------------+-------------+----------+---------+  Right Basilic     Diameter (cm) Depth (cm) Findings   +-----------------+-------------+----------+---------+  Shoulder              0.42         0.83               +-----------------+-------------+----------+---------+  Mid upper arm         0.45         0.47               +-----------------+-------------+----------+---------+  Dist upper arm        0.46         0.37    branching  +-----------------+-------------+----------+---------+  Antecubital fossa     0.19         0.12               +-----------------+-------------+----------+---------+  Prox forearm          0.25         0.18    branching  +-----------------+-------------+----------+---------+  Mid forearm           0.20         0.09               +-----------------+-------------+----------+---------+  Distal forearm        0.22         0.08               +-----------------+-------------+----------+---------+ +-----------------+-------------+----------+---------+  Left Cephalic     Diameter (cm) Depth (cm) Findings   +-----------------+-------------+----------+---------+  Shoulder              0.35  0.85               +-----------------+-------------+----------+---------+  Prox upper arm        0.36         0.20               +-----------------+-------------+----------+---------+  Mid upper arm         0.32         0.15    branching  +-----------------+-------------+----------+---------+  Dist upper arm        0.32         0.23               +-----------------+-------------+----------+---------+  Antecubital fossa     0.26         0.22    branching  +-----------------+-------------+----------+---------+  Prox forearm          0.24         0.14    branching  +-----------------+-------------+----------+---------+  Mid forearm           0.22         0.21    branching  +-----------------+-------------+----------+---------+  Dist forearm           0.19         0.10               +-----------------+-------------+----------+---------+ +-----------------+-------------+----------+---------+  Left Basilic      Diameter (cm) Depth (cm) Findings   +-----------------+-------------+----------+---------+  Shoulder              0.29         0.53               +-----------------+-------------+----------+---------+  Mid upper arm         0.33         0.51               +-----------------+-------------+----------+---------+  Dist upper arm        0.42         0.37    branching  +-----------------+-------------+----------+---------+  Antecubital fossa     0.32         0.20               +-----------------+-------------+----------+---------+  Prox forearm          0.19         0.15    branching  +-----------------+-------------+----------+---------+  Mid forearm           0.13         0.08               +-----------------+-------------+----------+---------+  Distal forearm        0.10         0.09               +-----------------+-------------+----------+---------+ *See table(s) above for measurements and observations.  Diagnosing physician:    Preliminary      LOS: 3 days   Oren Binet, MD  [facility]    To contact the attending provider between 7A-7P or the covering provider during after hours 7P-7A, please log into the web site [email or web address] and access using universal [place] password for that web site. If you do not have the password, please call the hospital operator.  02/01/2022, 1:24 PM

## 2022-02-01 NOTE — Progress Notes (Signed)
Bilateral upper extremity vein mapping has been completed. ?Preliminary results can be found in CV Proc through chart review.  ? ?02/01/22 11:43 AM ?Carlos Levering RVT   ?

## 2022-02-01 NOTE — Progress Notes (Signed)
Subjective: No complaints other than being hungry, n.p.o. for IR HD PermCath insert today then HD.  Objective Vital signs in last 24 hours: Vitals:   01/31/22 1551 01/31/22 2000 02/01/22 0400 02/01/22 0807  BP: (!) 118/99 (!) 125/93 (!) 137/99 (!) 147/108  Pulse: 62 (!) 55 (!) 56 (!) 57  Resp: 19 20 20 16   Temp: 98.4 F (36.9 C) 98.2 F (36.8 C) 98.1 F (36.7 C) 97.7 F (36.5 C)  TempSrc: Oral Oral Oral Oral  SpO2: 100% 98% 96% 96%  Weight:      Height:       Weight change:  Physical Exam: General: Alert young female NAD Heart: RRR, no MRG  Lungs: CTA nonlabored breathing Abdomen: NABS, mild ascites, NT, soft Extremities: Trace bipedal lower extremity dialysis Access: No current access, prior site dry clear TDC was removed 3/01/line holiday   Dialysis Orders: Dialyzes at [facility] TTS 4 hrs EDW 52 kg 2K/ 2 Ca bath BFR 400 via TDC, DFR 800 Hectorol 3 q rx Venofer 100 q rx x 10, ending 02/08/22 Mircera 225 q 2 weeks, last given 01/25/22   Problem/Plan: 1 SIRS due to MRSA bacteremia sepsis-  TDC removed by IR 3/1- and for reinsert today after her line holiday. Repeat BC X 2 recheck 3/01, negative so far, ECHO yesterday no significant vegetation but TEE noted ordered for Monday a.m. on vancomycin, infectious disease following 2 ESRD: TTS HD, last HD 01/30/19 , a.m. lab K5.7 creatinine 7.7 stable  volume status .  Patient agrees for arm access now, VVS consulting .  HD today after PermCath 3 Hypertension/volume-BP has improved since UF on HD, weights are not corresponding to HD, 3 L UF 3/01 HD no change in post weight 63.5, chest x-ray 3/01Small bilateral pleural effusions with bibasilar atelectasis  4. Anemia of ESRD: A.m. 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  Lupus; on pred and plaquenil   Ernest Haber, PA-C [facility] Beeper [phone] 02/01/2022,9:19 AM   LOS: 3 days   Labs: Basic Metabolic Panel: Recent Labs  Lab 01/30/22 0432 01/31/22 0202 02/01/22 0100  NA 134* 130* 130*  K 4.7 4.9 5.7*  CL 95* 93* 93*  CO2 25 23 21*  GLUCOSE 89 118* 104*  BUN 42* 58* 82*  CREATININE 5.87* 6.91* 7.71*  CALCIUM 7.4* 7.7* 8.1*  PHOS  --  5.1* 4.0   Liver Function Tests: Recent Labs  Lab 01/29/22 0246 01/31/22 0202 02/01/22 0100  AST 30  --   --   ALT 44  --   --   ALKPHOS 192*  --   --   BILITOT 0.9  --   --   PROT 6.2*  --   --   ALBUMIN 2.8* 2.5* 2.6*   No results for input(s): LIPASE, AMYLASE in the last 168 hours. No results for input(s): AMMONIA in the last 168 hours. CBC: Recent Labs  Lab 01/29/22 0246 01/30/22 0432 01/31/22 0202 02/01/22 0100  WBC 15.4* 11.8* 13.2* 11.7*  NEUTROABS 14.2*  --   --   --   HGB 10.9* 10.0* 9.3* 9.2*  HCT 35.1* 32.1* 29.8* 29.2*  MCV 93.9 91.7 91.7 91.8  PLT 176 155 136* 137*   Cardiac Enzymes: No results for input(s): CKTOTAL, CKMB, CKMBINDEX, TROPONINI in the last 168 hours. CBG: No results for input(s): GLUCAP in the last 168 hours.  Studies/Results: ECHOCARDIOGRAM COMPLETE  Result Date: 01/30/2022    ECHOCARDIOGRAM REPORT   Patient Name:   BEVERLY FERNER Date of Exam: 01/30/2022 Medical Rec #:  [id number]        Height:       67.0 in Accession #:    [id number]       Weight:       140.0 lb Date of Birth:  [birth year]       BSA:          1.738 m Patient Age:    41 years         BP:           117/91 mmHg Patient Gender: F                HR:           74 bpm. Exam Location:  Inpatient Procedure: 2D Echo Indications:    Bacteremia  History:        Patient has prior history of Echocardiogram examinations, most                 recent 01/06/2022. Pericardial Disease; Signs/Symptoms:Shortness                 of Breath.  Sonographer:    Arlyss Gandy Referring Phys: [id number] Barry  1. Left ventricular ejection fraction, by estimation, is 35 to 40%. The left ventricle has mildly  decreased function. The left ventricle demonstrates global hypokinesis. There is moderate concentric left ventricular hypertrophy. The appearance of the myocardium is concerning for possible infiltrative cardiomypathy - consider further work-up for amyloid. Left ventricular diastolic parameters are consistent with Grade III diastolic dysfunction (restrictive). Elevated left ventricular end-diastolic pressure. The E/e' is 66.  2. Right ventricular systolic function is mildly reduced. The right ventricular size is normal. There is normal pulmonary artery systolic pressure. The estimated right ventricular systolic pressure is 21.1 mmHg.  3. Left atrial size was mildly dilated.  4. Right atrial size was moderately dilated.  5. The pericardial effusion is posterior to the left ventricle.  6. The mitral valve is abnormal. Mild mitral valve regurgitation.  7. The tricuspid valve is abnormal. Tricuspid valve regurgitation is moderate to severe.  8. The aortic valve is tricuspid. Aortic valve regurgitation is trivial. Aortic valve sclerosis is present, with no evidence of aortic valve stenosis.  9. Mildly dilated pulmonary artery. Comparison(s): Changes from prior study are noted. 01/06/2022: LVEF 20-25%. Conclusion(s)/Recommendation(s): Although there is valve thickening, there is No evidence of valvular vegetation on this transthoracic echocardiogram. Consider a transesophageal echocardiogram to exclude infective endocarditis if clinically indicated. FINDINGS  Left Ventricle: Left ventricular ejection fraction, by estimation, is 35 to 40%. The left ventricle has moderately decreased function. The left ventricle demonstrates global hypokinesis. The left ventricular internal cavity size was normal in size. There is moderate concentric left ventricular hypertrophy. Left ventricular diastolic parameters are consistent with Grade III diastolic dysfunction (restrictive). Elevated left ventricular end-diastolic pressure. The E/e'  is 63. Right Ventricle: The right ventricular size is normal. No increase in right ventricular wall thickness. Right ventricular systolic function is mildly reduced. There is normal pulmonary artery systolic pressure. The tricuspid regurgitant velocity is 1.16 m/s, and with an assumed right atrial pressure of 15 mmHg, the estimated right ventricular systolic pressure is 94.1 mmHg. Left Atrium: Left atrial size was mildly dilated. Right Atrium: Right atrial size was moderately dilated. Pericardium: Trivial pericardial effusion is present. The pericardial effusion is posterior  to the left ventricle. Mitral Valve: The mitral valve is abnormal. There is moderate thickening of the anterior and posterior mitral valve leaflet(s). Mild mitral valve regurgitation. Tricuspid Valve: The tricuspid valve is abnormal. Tricuspid valve regurgitation is moderate to severe. Aortic Valve: The aortic valve is tricuspid. Aortic valve regurgitation is trivial. Aortic valve sclerosis is present, with no evidence of aortic valve stenosis. Aortic valve mean gradient measures 4.0 mmHg. Aortic valve peak gradient measures 8.0 mmHg. Aortic valve area, by VTI measures 2.51 cm. Pulmonic Valve: The pulmonic valve was grossly normal. Pulmonic valve regurgitation is mild. Aorta: The aortic root and ascending aorta are structurally normal, with no evidence of dilitation. Pulmonary Artery: The pulmonary artery is mildly dilated. IAS/Shunts: There is left bowing of the interatrial septum, suggestive of elevated right atrial pressure. No atrial level shunt detected by color flow Doppler.  LEFT VENTRICLE PLAX 2D LVIDd:         4.80 cm   Diastology LVIDs:         4.00 cm   LV e' medial:    4.57 cm/s LV PW:         1.20 cm   LV E/e' medial:  33.5 LV IVS:        1.20 cm   LV e' lateral:   6.31 cm/s LVOT diam:     2.00 cm   LV E/e' lateral: 24.2 LV SV:         61 LV SV Index:   35 LVOT Area:     3.14 cm  RIGHT VENTRICLE            IVC RV Basal diam:  4.40  cm    IVC diam: 3.10 cm RV Mid diam:    3.50 cm RV S prime:     7.29 cm/s TAPSE (M-mode): 1.1 cm LEFT ATRIUM           Index        RIGHT ATRIUM           Index LA diam:      3.80 cm 2.19 cm/m   RA Area:     23.40 cm LA Vol (A2C): 63.4 ml 36.48 ml/m  RA Volume:   73.20 ml  42.12 ml/m LA Vol (A4C): 37.2 ml 21.41 ml/m  AORTIC VALVE AV Area (Vmax):    2.72 cm AV Area (Vmean):   2.53 cm AV Area (VTI):     2.51 cm AV Vmax:           141.00 cm/s AV Vmean:          93.000 cm/s AV VTI:            0.243 m AV Peak Grad:      8.0 mmHg AV Mean Grad:      4.0 mmHg LVOT Vmax:         122.00 cm/s LVOT Vmean:        74.900 cm/s LVOT VTI:          0.194 m LVOT/AV VTI ratio: 0.80  AORTA Ao Root diam: 2.80 cm Ao Asc diam:  2.80 cm MITRAL VALVE                TRICUSPID VALVE MV Area (PHT): 5.84 cm     TR Peak grad:   5.4 mmHg MV Decel Time: 130 msec     TR Vmax:        116.00 cm/s MV E velocity: 153.00 cm/s MV A velocity: 50.10 cm/s  SHUNTS MV E/A ratio:  3.05         Systemic VTI:  0.19 m                             Systemic Diam: 2.00 cm Lyman Bishop MD Electronically signed by Lyman Bishop MD Signature Date/Time: 01/30/2022/1:12:26 PM    Final    Medications:   ceFAZolin (ANCEF) IV      amLODipine  10 mg Oral Daily   apixaban  5 mg Oral BID   calcium acetate  1,334 mg Oral TID WC   carvedilol  12.5 mg Oral BID WC   Chlorhexidine Gluconate Cloth  6 each Topical Q0600   cloNIDine  0.3 mg Oral TID   colchicine  0.3 mg Oral Daily   hydrALAZINE  100 mg Oral TID   hydroxychloroquine  100 mg Oral Q48H   nicotine  14 mg Transdermal Daily   predniSONE  40 mg Oral Q breakfast   sodium chloride flush  3 mL Intravenous Q12H   sodium zirconium cyclosilicate  10 g Oral Daily   vancomycin variable dose per unstable renal function (pharmacist dosing)   Does not apply See admin instructions

## 2022-02-01 NOTE — Procedures (Signed)
Interventional Radiology Procedure Note ? ?Date of Procedure: 02/01/2022  ?Procedure: Tunneled HD catheter placement  ? ?Findings:  ?1. Successful placement of 19 cm tip-to-cuff tunneled HD catheter via right IJ    ? ?Complications: No immediate complications noted.  ? ?Estimated Blood Loss: minimal ? ?Follow-up and Recommendations: ?1. Ready for use  ? ? ?Albin Felling, MD  ?Vascular [facility] Interventional Radiology  ?02/01/2022 1:15 PM ? ? ? ?

## 2022-02-02 DIAGNOSIS — I2699 Other pulmonary embolism without acute cor pulmonale: Secondary | ICD-10-CM | POA: Diagnosis not present

## 2022-02-02 DIAGNOSIS — R7881 Bacteremia: Secondary | ICD-10-CM | POA: Diagnosis not present

## 2022-02-02 DIAGNOSIS — N186 End stage renal disease: Secondary | ICD-10-CM | POA: Diagnosis not present

## 2022-02-02 DIAGNOSIS — M3214 Glomerular disease in systemic lupus erythematosus: Secondary | ICD-10-CM | POA: Diagnosis not present

## 2022-02-02 LAB — RENAL FUNCTION PANEL
Albumin: 2.4 g/dL — ABNORMAL LOW (ref 3.5–5.0)
Anion gap: 14 (ref 5–15)
BUN: 42 mg/dL — ABNORMAL HIGH (ref 6–20)
CO2: 25 mmol/L (ref 22–32)
Calcium: 7.5 mg/dL — ABNORMAL LOW (ref 8.9–10.3)
Chloride: 95 mmol/L — ABNORMAL LOW (ref 98–111)
Creatinine, Ser: 4.87 mg/dL — ABNORMAL HIGH (ref 0.44–1.00)
GFR, Estimated: 11 mL/min — ABNORMAL LOW (ref >60)
Glucose, Bld: 98 mg/dL (ref 70–99)
Phosphorus: 3.2 mg/dL (ref 2.5–4.6)
Potassium: 3.9 mmol/L (ref 3.5–5.1)
Sodium: 134 mmol/L — ABNORMAL LOW (ref 135–145)

## 2022-02-02 LAB — CBC
HCT: 28.7 % — ABNORMAL LOW (ref 36.0–46.0)
Hemoglobin: 8.9 g/dL — ABNORMAL LOW (ref 12.0–15.0)
MCH: 28.2 pg (ref 26.0–34.0)
MCHC: 31 g/dL (ref 30.0–36.0)
MCV: 90.8 fL (ref 80.0–100.0)
Platelets: 174 10*3/uL (ref 150–400)
RBC: 3.16 MIL/uL — ABNORMAL LOW (ref 3.87–5.11)
RDW: 20.9 % — ABNORMAL HIGH (ref 11.5–15.5)
WBC: 13.3 10*3/uL — ABNORMAL HIGH (ref 4.0–10.5)
nRBC: 0.4 % — ABNORMAL HIGH (ref 0.0–0.2)

## 2022-02-02 MED ORDER — NEPRO/CARBSTEADY PO LIQD
237.0000 mL | Freq: Two times a day (BID) | ORAL | Status: DC
  Administered 2022-02-02 – 2022-02-06 (×2): 237 mL via ORAL

## 2022-02-02 MED ORDER — DARBEPOETIN ALFA 200 MCG/0.4ML IJ SOSY
200.0000 ug | PREFILLED_SYRINGE | INTRAMUSCULAR | Status: DC
  Administered 2022-02-04: 200 ug via INTRAVENOUS
  Filled 2022-02-02 (×2): qty 0.4

## 2022-02-02 NOTE — Progress Notes (Signed)
?  Daily Progress Note ? ? ?Subjective: ?Doing well this morning. No complaints. ? ?Objective: ?Vitals:  ? 02/01/22 2327 02/02/22 0438  ?BP: (!) 137/92 (!) 163/118  ?Pulse: 68 63  ?Resp: 19 (!) 21  ?Temp: 98.9 ?F (37.2 ?C) 98.5 ?F (36.9 ?C)  ?SpO2: 98% 95%  ?  ?Physical Examination ? ?General:  WDWN in NAD ?Gait: Not observed ?HENT: WNL, normocephalic ?Pulmonary: normal non-labored breathing ?Cardiac: regular ?Skin: without rashes ?Vascular Exam/Pulses: ?Bilateral radial and DP pulses are easily palpable ?Extremities: without ischemic changes, without Gangrene , without cellulitis; without open wounds ?Musculoskeletal: no muscle wasting or atrophy       ?Neurologic: A&O X 3; speech is fluent/normal ?Psychiatric:  The pt has Normal affect. ? ?ASSESSMENT/PLAN:  ?End stage renal disease in need of long term dialysis access.  ?Noninvasive duplex ultrasonography reviewed demonstrating adequate cephalic vein bilaterally. ?Being that the car is right-handed, we discussed left-sided AV fistula creation. ?After discussing the risks and benefits, Cathy [name] elected to proceed. ?TEE scheduled for tomorrow.  ?Will schedule for Wednesday. ? ? ?J. Melene Muller MD MS ?[facility] ?[phone] ?02/02/2022  ?8:32 AM ? ?

## 2022-02-02 NOTE — Progress Notes (Signed)
PROGRESS NOTE        PATIENT DETAILS Name: Cathy [name] Age: 41 y.o. Sex: female Date of Birth: Aug 25, 1981 Admit Date: 01/29/2022 Admitting Physician Christel Mormon, MD PCP:Pcp, No  Brief Summary: Patient is a 41 y.o.  female with history of ESRD, lupus, HTN, HLD-history of chronic under dialysis-numerous recent hospitalizations-presented with fever-found to have MRSA bacteremia.  Significant Hospital events: 1/13-1/17>> RLL pneumonia, large parapneumonic effusion, s/p chest tube and cefepime  1/26-1/30>> RML pneumonia, pericarditis/ effusion (? Lupus vs uremic), and lupus flare 2/5/-2/9>> hypertensive urgency, PE, EF down to 20-25% 3/1>> admit for fever-found to have MRSA bacteremia. 3/1>> tunneled HD catheter removed after dialysis. 3/4>> tunneled HD catheter placed by IR  Significant imaging studies: 3/1>> CXR: Small bilateral pleural effusions. 3/2>> Echo: No obvious vegetations, EF 35-40%.  Grade 3 diastolic dysfunction.  Trivial pericardial effusion.  Significant microbiology data: 3/1>> COVID/influenza PCR: Positive 3/1>> blood culture: MRSA 3/2>> blood culture: Negative.  Procedures: 3/1>> removal of tunneled dialysis catheter by IR. 3/4>> tunneled dialysis catheter placement by IR.  Consults:  ID, IR, nephrology  Subjective: Lying comfortably in bed-denies any chest pain or shortness of breath.  Objective: Vitals: Blood pressure (!) 141/104, pulse 69, temperature 98.7 F (37.1 C), temperature source Oral, resp. rate 20, height 5\' 7"  (1.702 m), weight 69.1 kg, SpO2 95 %, unknown if currently breastfeeding.   Exam: Gen Exam:Alert awake-not in any distress HEENT:atraumatic, normocephalic Chest: B/L clear to auscultation anteriorly CVS:S1S2 regular Abdomen:soft non tender, non distended Extremities:no edema Neurology: Non focal Skin: no rash   Pertinent Labs/Radiology: CBC Latest Ref Rng & Units 02/02/2022 02/01/2022 01/31/2022  WBC  4.0 - 10.5 K/uL 13.3(H) 11.7(H) 13.2(H)  Hemoglobin 12.0 - 15.0 g/dL 8.9(L) 9.2(L) 9.3(L)  Hematocrit 36.0 - 46.0 % 28.7(L) 29.2(L) 29.8(L)  Platelets 150 - 400 K/uL 174 137(L) 136(L)    Lab Results  Component Value Date   NA 134 (L) 02/02/2022   K 3.9 02/02/2022   CL 95 (L) 02/02/2022   CO2 25 02/02/2022      Assessment/Plan: SIRS due to MRSA bacteremia: Clinically improved-afebrile-leukocytosis has resolved-repeat blood cultures on 3/2 negative so far.  On IV vancomycin with hemodialysis.  Given line holiday-after removal of TDC on 3/1, since cultures were negative-TDC reinserted on 3/4.  TEE planned for 3/6.  ID following.  ESRD on HD TTS: Nephrology following.  Longstanding history of under dialysis/noncompliance.  Vascular surgery following for permanent access.  Hyperkalemia: Resolved after HD on 3/4.  Normocytic anemia: Due to CKD-defer Aranesp/iron therapy to nephrology.  Chronic combined systolic and diastolic heart failure: Trace lower extremity edema but otherwise stable.  Diuresis with HD.  Will need cardiology input at some point in time when she is a bit more stable-echo findings suspicious for infiltrative cardiomyopathy.    Pericardial effusion: Appears to be a chronic issue-suspect related to poor compliance to dialysis/under dialysis.  Echo on 3/2 showed small pericardial effusion.  Pulmonary embolism: Remains on Eliquis.  HTN: BP stable-continue amlodipine, Coreg, clonidine  History of lupus: Appears stable-continue Plaquenil and prednisone  Elevated point-of-care beta-hCG: Likely false positive elevation-Per patient-she has not had her menstrual period for close to a year-and she has not engage in sexual intercourse for "years".  Formal beta hCG not elevated.  Tobacco abuse: Encourage cessation-on transdermal nicotine.  Debility/deconditioning: Some of this is chronic-likely worsened by  MRSA bacteremia.  Mobilize with rehab services and see how she  does.  BMI: Estimated body mass index is 23.86 kg/m as calculated from the following:   Height as of this encounter: 5\' 7"  (1.702 m).   Weight as of this encounter: 69.1 kg.   Code status:   Code Status: Full Code   DVT Prophylaxis: apixaban (ELIQUIS) tablet 5 mg    Family Communication: None at bedside  Disposition Plan: Status is: Inpatient Remains inpatient appropriate because: MRSA bacteremia-on IV vancomycin-work-up in progress-not yet stable for discharge.   Planned Discharge Destination:Home   Diet: Diet Order             Diet NPO time specified Except for: Sips with Meds  Diet effective midnight           Diet renal with fluid restriction Fluid restriction: 1200 mL Fluid; Room service appropriate? Yes; Fluid consistency: Thin  Diet effective now                     Antimicrobial agents: Anti-infectives (From admission, onward)    Start     Dose/Rate Route Frequency Ordered Stop   02/01/22 1433  vancomycin (VANCOREADY) IVPB 750 mg/150 mL        750 mg 150 mL/hr over 60 Minutes Intravenous Every Dialysis 02/01/22 1435 02/01/22 1928   02/01/22 0000  ceFAZolin (ANCEF) IVPB 2g/100 mL premix       Note to Pharmacy: Pls contact Jardine or Jannifer Franklin if you have question over the weekend.   2 g 200 mL/hr over 30 Minutes Intravenous To Radiology 01/31/22 1555 02/01/22 1607   01/30/22 2200  ceFEPIme (MAXIPIME) 1 g in sodium chloride 0.9 % 100 mL IVPB  Status:  Discontinued        1 g 200 mL/hr over 30 Minutes Intravenous Every 24 hours 01/29/22 0606 01/29/22 1453   01/30/22 1200  vancomycin (VANCOCIN) 750 mg in sodium chloride 0.9 % 250 mL IVPB  Status:  Discontinued        750 mg 250 mL/hr over 60 Minutes Intravenous Every T-Th-Sa (Hemodialysis) 01/29/22 0606 01/30/22 0806   01/30/22 1000  hydroxychloroquine (PLAQUENIL) tablet 100 mg        100 mg Oral Every 48 hours 01/29/22 0904     01/30/22 0900  vancomycin (VANCOREADY) IVPB 750 mg/150 mL        750  mg 150 mL/hr over 60 Minutes Intravenous  Once 01/30/22 0806 01/30/22 1056   01/30/22 0806  vancomycin variable dose per unstable renal function (pharmacist dosing)         Does not apply See admin instructions 01/30/22 0806     01/29/22 0615  ceFEPIme (MAXIPIME) 2 g in sodium chloride 0.9 % 100 mL IVPB        2 g 200 mL/hr over 30 Minutes Intravenous  Once 01/29/22 0600 01/29/22 0722   01/29/22 0615  metroNIDAZOLE (FLAGYL) IVPB 500 mg        500 mg 100 mL/hr over 60 Minutes Intravenous  Once 01/29/22 0600 01/29/22 0903   01/29/22 0615  vancomycin (VANCOCIN) IVPB 1000 mg/200 mL premix  Status:  Discontinued        1,000 mg 200 mL/hr over 60 Minutes Intravenous  Once 01/29/22 0600 01/29/22 0604   01/29/22 0615  Vancomycin (VANCOCIN) 1,250 mg in sodium chloride 0.9 % 250 mL IVPB        1,250 mg 166.7 mL/hr over 90 Minutes Intravenous  Once 01/29/22  0604 01/29/22 0934        MEDICATIONS: Scheduled Meds:  amLODipine  10 mg Oral Daily   apixaban  5 mg Oral BID   calcium acetate  1,334 mg Oral TID WC   carvedilol  12.5 mg Oral BID WC   Chlorhexidine Gluconate Cloth  6 each Topical Q0600   cloNIDine  0.3 mg Oral TID   colchicine  0.3 mg Oral Daily   [START ON 02/04/2022] darbepoetin (ARANESP) injection - DIALYSIS  200 mcg Intravenous Q Tue-HD   feeding supplement (NEPRO CARB STEADY)  237 mL Oral BID BM   hydrALAZINE  100 mg Oral TID   hydroxychloroquine  100 mg Oral Q48H   nicotine  14 mg Transdermal Daily   predniSONE  40 mg Oral Q breakfast   sodium chloride flush  3 mL Intravenous Q12H   sodium zirconium cyclosilicate  10 g Oral Daily   vancomycin variable dose per unstable renal function (pharmacist dosing)   Does not apply See admin instructions   Continuous Infusions: PRN Meds:.acetaminophen **OR** acetaminophen, albuterol, calcium carbonate (dosed in mg elemental calcium), docusate sodium, feeding supplement (NEPRO CARB STEADY), HYDROmorphone (DILAUDID) injection, hydrOXYzine,  methocarbamol, ondansetron **OR** ondansetron (ZOFRAN) IV, oxyCODONE, sorbitol, zolpidem   I have personally reviewed following labs and imaging studies  LABORATORY DATA: CBC: Recent Labs  Lab 01/29/22 0246 01/30/22 0432 01/31/22 0202 02/01/22 0100 02/02/22 0203  WBC 15.4* 11.8* 13.2* 11.7* 13.3*  NEUTROABS 14.2*  --   --   --   --   HGB 10.9* 10.0* 9.3* 9.2* 8.9*  HCT 35.1* 32.1* 29.8* 29.2* 28.7*  MCV 93.9 91.7 91.7 91.8 90.8  PLT 176 155 136* 137* 174     Basic Metabolic Panel: Recent Labs  Lab 01/29/22 0246 01/30/22 0432 01/31/22 0202 02/01/22 0100 02/01/22 1121 02/02/22 0203  NA 136 134* 130* 130*  --  134*  K 5.9* 4.7 4.9 5.7* 5.1 3.9  CL 96* 95* 93* 93*  --  95*  CO2 22 25 23  21*  --  25  GLUCOSE 93 89 118* 104*  --  98  BUN 85* 42* 58* 82*  --  42*  CREATININE 9.33* 5.87* 6.91* 7.71*  --  4.87*  CALCIUM 8.6* 7.4* 7.7* 8.1*  --  7.5*  PHOS  --   --  5.1* 4.0  --  3.2     GFR: Estimated Creatinine Clearance: 15.2 mL/min (A) (by C-G formula based on SCr of 4.87 mg/dL (H)).  Liver Function Tests: Recent Labs  Lab 01/29/22 0246 01/31/22 0202 02/01/22 0100 02/02/22 0203  AST 30  --   --   --   ALT 44  --   --   --   ALKPHOS 192*  --   --   --   BILITOT 0.9  --   --   --   PROT 6.2*  --   --   --   ALBUMIN 2.8* 2.5* 2.6* 2.4*    No results for input(s): LIPASE, AMYLASE in the last 168 hours. No results for input(s): AMMONIA in the last 168 hours.  Coagulation Profile: Recent Labs  Lab 01/29/22 0246  INR 2.1*     Cardiac Enzymes: No results for input(s): CKTOTAL, CKMB, CKMBINDEX, TROPONINI in the last 168 hours.  BNP (last 3 results) No results for input(s): PROBNP in the last 8760 hours.  Lipid Profile: No results for input(s): CHOL, HDL, LDLCALC, TRIG, CHOLHDL, LDLDIRECT in the last 72 hours.  Thyroid Function Tests:  No results for input(s): TSH, T4TOTAL, FREET4, T3FREE, THYROIDAB in the last 72 hours.  Anemia Panel: No results  for input(s): VITAMINB12, FOLATE, FERRITIN, TIBC, IRON, RETICCTPCT in the last 72 hours.  Urine analysis:    Component Value Date/Time   COLORURINE YELLOW 11/29/2020 2054   APPEARANCEUR HAZY (A) 11/29/2020 2054   LABSPEC 1.015 11/29/2020 2054   PHURINE 6.0 11/29/2020 2054   GLUCOSEU NEGATIVE 11/29/2020 2054   HGBUR MODERATE (A) 11/29/2020 2054   BILIRUBINUR NEGATIVE 11/29/2020 2054   KETONESUR NEGATIVE 11/29/2020 2054   PROTEINUR >=300 (A) 11/29/2020 2054   UROBILINOGEN 0.2 10/17/2020 1026   NITRITE NEGATIVE 11/29/2020 2054   LEUKOCYTESUR NEGATIVE 11/29/2020 2054    Sepsis Labs: Lactic Acid, Venous    Component Value Date/Time   LATICACIDVEN 3.1 (HH) 01/29/2022 0748    MICROBIOLOGY: Recent Results (from the past 240 hour(s))  Resp Panel by RT-PCR (Flu A&B, Covid) Nasopharyngeal Swab     Status: None   Collection Time: 01/29/22  2:18 AM   Specimen: Nasopharyngeal Swab; Nasopharyngeal(NP) swabs in vial transport medium  Result Value Ref Range Status   SARS Coronavirus 2 by RT PCR NEGATIVE NEGATIVE Final    Comment: (NOTE) SARS-CoV-2 target nucleic acids are NOT DETECTED.  The SARS-CoV-2 RNA is generally detectable in upper respiratory specimens during the acute phase of infection. The lowest concentration of SARS-CoV-2 viral copies this assay can detect is 138 copies/mL. A negative result does not preclude SARS-Cov-2 infection and should not be used as the sole basis for treatment or other patient management decisions. A negative result may occur with  improper specimen collection/handling, submission of specimen other than nasopharyngeal swab, presence of viral mutation(s) within the areas targeted by this assay, and inadequate number of viral copies(<138 copies/mL). A negative result must be combined with clinical observations, patient history, and epidemiological information. The expected result is Negative.  Fact Sheet for Patients:   EntrepreneurPulse.com.au  Fact Sheet for Healthcare Providers:  IncredibleEmployment.be  This test is no t yet approved or cleared by the [place] FDA and  has been authorized for detection and/or diagnosis of SARS-CoV-2 by FDA under an Emergency Use Authorization (EUA). This EUA will remain  in effect (meaning this test can be used) for the duration of the COVID-19 declaration under Section 564(b)(1) of the Act, 21 U.S.C.section 360bbb-3(b)(1), unless the authorization is terminated  or revoked sooner.       Influenza A by PCR NEGATIVE NEGATIVE Final   Influenza B by PCR NEGATIVE NEGATIVE Final    Comment: (NOTE) The Xpert Xpress SARS-CoV-2/FLU/RSV plus assay is intended as an aid in the diagnosis of influenza from Nasopharyngeal swab specimens and should not be used as a sole basis for treatment. Nasal washings and aspirates are unacceptable for Xpert Xpress SARS-CoV-2/FLU/RSV testing.  Fact Sheet for Patients: EntrepreneurPulse.com.au  Fact Sheet for Healthcare Providers: IncredibleEmployment.be  This test is not yet approved or cleared by the [place] FDA and has been authorized for detection and/or diagnosis of SARS-CoV-2 by FDA under an Emergency Use Authorization (EUA). This EUA will remain in effect (meaning this test can be used) for the duration of the COVID-19 declaration under Section 564(b)(1) of the Act, 21 U.S.C. section 360bbb-3(b)(1), unless the authorization is terminated or revoked.  Performed at [facility], [place] [address], [place], [place] [address]   Blood Culture (routine x 2)     Status: Abnormal   Collection Time: 01/29/22  2:30 AM   Specimen: BLOOD LEFT  ARM  Result Value Ref Range Status   Specimen Description BLOOD LEFT ARM  Final   Special Requests   Final    BOTTLES DRAWN AEROBIC AND ANAEROBIC Blood Culture results may not be optimal due to an excessive  volume of blood received in culture bottles   Culture  Setup Time   Final    GRAM POSITIVE COCCI IN CLUSTERS IN BOTH AEROBIC AND ANAEROBIC BOTTLES CRITICAL RESULT CALLED TO, READ BACK BY AND VERIFIED WITH: PHARMD M.WEISKOPH AT 8938 ON 01/29/2022 BY T.SAAD. Performed at [facility], [place] [address] [place], [place] [address]    Culture METHICILLIN RESISTANT STAPHYLOCOCCUS AUREUS (A)  Final   Report Status 01/31/2022 FINAL  Final   Organism ID, Bacteria METHICILLIN RESISTANT STAPHYLOCOCCUS AUREUS  Final      Susceptibility   Methicillin resistant staphylococcus aureus - MIC*    CIPROFLOXACIN <=0.5 SENSITIVE Sensitive     ERYTHROMYCIN >=8 RESISTANT Resistant     GENTAMICIN <=0.5 SENSITIVE Sensitive     OXACILLIN >=4 RESISTANT Resistant     TETRACYCLINE <=1 SENSITIVE Sensitive     VANCOMYCIN 1 SENSITIVE Sensitive     TRIMETH/SULFA <=10 SENSITIVE Sensitive     CLINDAMYCIN <=0.25 SENSITIVE Sensitive     RIFAMPIN <=0.5 SENSITIVE Sensitive     Inducible Clindamycin NEGATIVE Sensitive     * METHICILLIN RESISTANT STAPHYLOCOCCUS AUREUS  Blood Culture ID Panel (Reflexed)     Status: Abnormal   Collection Time: 01/29/22  2:30 AM  Result Value Ref Range Status   Enterococcus faecalis NOT DETECTED NOT DETECTED Final   Enterococcus Faecium NOT DETECTED NOT DETECTED Final   Listeria monocytogenes NOT DETECTED NOT DETECTED Final   Staphylococcus species DETECTED (A) NOT DETECTED Final    Comment: CRITICAL RESULT CALLED TO, READ BACK BY AND VERIFIED WITH: PHARMD M.WEISKOPH AT 1449 ON 01/29/2022 BY T.SAAD.    Staphylococcus aureus (BCID) DETECTED (A) NOT DETECTED Final    Comment: Methicillin (oxacillin)-resistant Staphylococcus aureus (MRSA). MRSA is predictably resistant to beta-lactam antibiotics (except ceftaroline). Preferred therapy is vancomycin unless clinically contraindicated. Patient requires contact precautions if  hospitalized. CRITICAL RESULT CALLED TO, READ BACK BY AND VERIFIED  WITH: PHARMD M.WEISKOPH AT 1449 ON 01/29/2022 BY T.SAAD.    Staphylococcus epidermidis NOT DETECTED NOT DETECTED Final   Staphylococcus lugdunensis NOT DETECTED NOT DETECTED Final   Streptococcus species NOT DETECTED NOT DETECTED Final   Streptococcus agalactiae NOT DETECTED NOT DETECTED Final   Streptococcus pneumoniae NOT DETECTED NOT DETECTED Final   Streptococcus pyogenes NOT DETECTED NOT DETECTED Final   A.calcoaceticus-baumannii NOT DETECTED NOT DETECTED Final   Bacteroides fragilis NOT DETECTED NOT DETECTED Final   Enterobacterales NOT DETECTED NOT DETECTED Final   Enterobacter cloacae complex NOT DETECTED NOT DETECTED Final   Escherichia coli NOT DETECTED NOT DETECTED Final   Klebsiella aerogenes NOT DETECTED NOT DETECTED Final   Klebsiella oxytoca NOT DETECTED NOT DETECTED Final   Klebsiella pneumoniae NOT DETECTED NOT DETECTED Final   Proteus species NOT DETECTED NOT DETECTED Final   Salmonella species NOT DETECTED NOT DETECTED Final   Serratia marcescens NOT DETECTED NOT DETECTED Final   Haemophilus influenzae NOT DETECTED NOT DETECTED Final   Neisseria meningitidis NOT DETECTED NOT DETECTED Final   Pseudomonas aeruginosa NOT DETECTED NOT DETECTED Final   Stenotrophomonas maltophilia NOT DETECTED NOT DETECTED Final   Candida albicans NOT DETECTED NOT DETECTED Final   Candida auris NOT DETECTED NOT DETECTED Final   Candida glabrata NOT DETECTED NOT DETECTED Final  Candida krusei NOT DETECTED NOT DETECTED Final   Candida parapsilosis NOT DETECTED NOT DETECTED Final   Candida tropicalis NOT DETECTED NOT DETECTED Final   Cryptococcus neoformans/gattii NOT DETECTED NOT DETECTED Final   Meth resistant mecA/C and MREJ DETECTED (A) NOT DETECTED Final    Comment: CRITICAL RESULT CALLED TO, READ BACK BY AND VERIFIED WITH: PHARMD M.WEISKOPH AT 1449 ON 01/29/2022 BY T.SAAD. Performed at [facility], [place] [address] [place], [place] [address]   Blood Culture (routine x 2)      Status: Abnormal   Collection Time: 01/29/22  2:40 AM   Specimen: BLOOD LEFT HAND  Result Value Ref Range Status   Specimen Description BLOOD LEFT HAND  Final   Special Requests   Final    BOTTLES DRAWN AEROBIC ONLY Blood Culture results may not be optimal due to an excessive volume of blood received in culture bottles   Culture  Setup Time   Final    GRAM POSITIVE COCCI IN CLUSTERS AEROBIC BOTTLE ONLY CRITICAL VALUE NOTED.  VALUE IS CONSISTENT WITH PREVIOUSLY REPORTED AND CALLED VALUE.    Culture (A)  Final    STAPHYLOCOCCUS AUREUS SUSCEPTIBILITIES PERFORMED ON PREVIOUS CULTURE WITHIN THE LAST 5 DAYS. Performed at [facility], [place] [address], [place], [place] [address]    Report Status 01/31/2022 FINAL  Final  Culture, blood (single)     Status: Abnormal   Collection Time: 01/29/22  5:56 AM   Specimen: BLOOD  Result Value Ref Range Status   Specimen Description BLOOD CENTRAL LINE  Final   Special Requests   Final    BOTTLES DRAWN AEROBIC AND ANAEROBIC Blood Culture adequate volume   Culture  Setup Time   Final    GRAM POSITIVE COCCI IN CLUSTERS IN BOTH AEROBIC AND ANAEROBIC BOTTLES CRITICAL VALUE NOTED.  VALUE IS CONSISTENT WITH PREVIOUSLY REPORTED AND CALLED VALUE.    Culture (A)  Final    STAPHYLOCOCCUS AUREUS SUSCEPTIBILITIES PERFORMED ON PREVIOUS CULTURE WITHIN THE LAST 5 DAYS. Performed at [facility], [place] [address], [place], [place] [address]    Report Status 01/31/2022 FINAL  Final  Culture, blood (routine x 2)     Status: None (Preliminary result)   Collection Time: 01/30/22  4:32 AM   Specimen: BLOOD  Result Value Ref Range Status   Specimen Description BLOOD LEFT ANTECUBITAL  Final   Special Requests   Final    BOTTLES DRAWN AEROBIC AND ANAEROBIC Blood Culture adequate volume   Culture   Final    NO GROWTH 2 DAYS Performed at [facility], [place] [address] [place], [place] [address]    Report Status PENDING  Incomplete  Culture, blood  (routine x 2)     Status: None (Preliminary result)   Collection Time: 01/30/22  4:45 AM   Specimen: BLOOD  Result Value Ref Range Status   Specimen Description BLOOD BLOOD RIGHT WRIST  Final   Special Requests   Final    BOTTLES DRAWN AEROBIC AND ANAEROBIC Blood Culture adequate volume   Culture   Final    NO GROWTH 2 DAYS Performed at [facility], [place] [address], [place], [place] [address]    Report Status PENDING  Incomplete    RADIOLOGY STUDIES/RESULTS: VAS [place] UPPER EXT VEIN MAPPING (PRE-OP AVF)  Result Date: 02/02/2022 UPPER EXTREMITY VEIN MAPPING Patient Name:  Cathy [name]  Date of Exam:   02/01/2022 Medical Rec #: [id number]  Accession #:    [id number] Date of Birth: 27-Jan-1981        Patient Gender: F Patient Age:   43 years Exam Location:  [facility] Procedure:      VAS [place] UPPER EXT VEIN MAPPING (PRE-OP AVF) Referring Phys: Aldona Bar RHYNE --------------------------------------------------------------------------------  Indications: Pre-access. Comparison Study: No prior studies. Performing Technologist: Oliver Hum RVT  Examination Guidelines: A complete evaluation includes B-mode imaging, spectral Doppler, color Doppler, and power Doppler as needed of all accessible portions of each vessel. Bilateral testing is considered an integral part of a complete examination. Limited examinations for reoccurring indications may be performed as noted. +-----------------+-------------+----------+---------+  Right Cephalic    Diameter (cm) Depth (cm) Findings   +-----------------+-------------+----------+---------+  Shoulder              0.34         0.46               +-----------------+-------------+----------+---------+  Prox upper arm        0.35         0.23               +-----------------+-------------+----------+---------+  Mid upper arm         0.38         0.14               +-----------------+-------------+----------+---------+  Dist upper arm        0.38          0.13               +-----------------+-------------+----------+---------+  Antecubital fossa     0.44         0.15    branching  +-----------------+-------------+----------+---------+  Prox forearm          0.24         0.28    branching  +-----------------+-------------+----------+---------+  Mid forearm           0.06         0.14               +-----------------+-------------+----------+---------+  Dist forearm          0.06         0.12               +-----------------+-------------+----------+---------+ +-----------------+-------------+----------+---------+  Right Basilic     Diameter (cm) Depth (cm) Findings   +-----------------+-------------+----------+---------+  Shoulder              0.42         0.83               +-----------------+-------------+----------+---------+  Mid upper arm         0.45         0.47               +-----------------+-------------+----------+---------+  Dist upper arm        0.46         0.37    branching  +-----------------+-------------+----------+---------+  Antecubital fossa     0.19         0.12               +-----------------+-------------+----------+---------+  Prox forearm          0.25         0.18    branching  +-----------------+-------------+----------+---------+  Mid forearm           0.20  0.09               +-----------------+-------------+----------+---------+  Distal forearm        0.22         0.08               +-----------------+-------------+----------+---------+ +-----------------+-------------+----------+---------+  Left Cephalic     Diameter (cm) Depth (cm) Findings   +-----------------+-------------+----------+---------+  Shoulder              0.35         0.85               +-----------------+-------------+----------+---------+  Prox upper arm        0.36         0.20               +-----------------+-------------+----------+---------+  Mid upper arm         0.32         0.15    branching  +-----------------+-------------+----------+---------+  Dist  upper arm        0.32         0.23               +-----------------+-------------+----------+---------+  Antecubital fossa     0.26         0.22    branching  +-----------------+-------------+----------+---------+  Prox forearm          0.24         0.14    branching  +-----------------+-------------+----------+---------+  Mid forearm           0.22         0.21    branching  +-----------------+-------------+----------+---------+  Dist forearm          0.19         0.10               +-----------------+-------------+----------+---------+ +-----------------+-------------+----------+---------+  Left Basilic      Diameter (cm) Depth (cm) Findings   +-----------------+-------------+----------+---------+  Shoulder              0.29         0.53               +-----------------+-------------+----------+---------+  Mid upper arm         0.33         0.51               +-----------------+-------------+----------+---------+  Dist upper arm        0.42         0.37    branching  +-----------------+-------------+----------+---------+  Antecubital fossa     0.32         0.20               +-----------------+-------------+----------+---------+  Prox forearm          0.19         0.15    branching  +-----------------+-------------+----------+---------+  Mid forearm           0.13         0.08               +-----------------+-------------+----------+---------+  Distal forearm        0.10         0.09               +-----------------+-------------+----------+---------+ *See table(s) above for measurements and observations.  Diagnosing physician: Orlie Pollen Electronically signed by Orlie Pollen on 02/02/2022 at 10:13:07  AM.    Final    IR TUNNELED CENTRAL VENOUS CATHETER PLACEMENT  Result Date: 02/01/2022 INDICATION: Need for dialysis access EXAM: Ultrasound-guided puncture of the right internal jugular vein Placement of a tunneled hemodialysis catheter using fluoroscopic guidance MEDICATIONS: Per EMR ANESTHESIA/SEDATION:  Moderate (conscious) sedation was employed during this procedure. A total of Versed 2.5 mg and Fentanyl 100 mcg was administered intravenously. Moderate Sedation Time: 20 minutes. The patient's level of consciousness and vital signs were monitored continuously by radiology nursing throughout the procedure under my direct supervision. FLUOROSCOPY TIME:  Fluoroscopy Time: 0.4 minutes (1 mGy) COMPLICATIONS: None immediate. PROCEDURE: Informed written consent was obtained from the patient after a thorough discussion of the procedural risks, benefits and alternatives. All questions were addressed. Maximal Sterile Barrier Technique was utilized including caps, mask, sterile gowns, sterile gloves, sterile drape, hand hygiene and skin antiseptic. A timeout was performed prior to the initiation of the procedure. The patient was placed supine on the exam table. The right neck and chest was prepped and draped in the standard sterile fashion. Ultrasound was used to evaluate the right internal jugular vein, which found to be patent and suitable for access. An ultrasound image was permanently stored in the electronic medical record. Using ultrasound guidance, the right internal jugular vein was directly punctured using a 21 gauge micropuncture set. An 018 wire was advanced into the SVC, followed by serial tract dilation and placement of an 035 wire which was advanced into the IVC. Attention was then turned to an infraclavicular location, where a small dermatotomy was made after the administration of additional local anesthetic. From this location, a 19 cm tip-to-cuff hemodialysis catheter was tunneled to the venotomy site. A peel-away sheath was then advanced over the access wire. Through this peel-away sheath, the hemodialysis catheter was advanced into the central veins, such that the tip was positioned near the superior cavoatrial junction. The line was found to flush and aspirate appropriately. It was sutured to the skin  using 0 silk suture, and the venotomy was closed with 4-0 Vicryl and Dermabond. A sterile dressing was placed. The patient tolerated the procedure well without immediate complication. IMPRESSION: Successful placement of a tunneled hemodialysis catheter using the right internal jugular vein. The line is ready for immediate use. Electronically Signed   By: Albin Felling M.D.   On: 02/01/2022 13:57     LOS: 4 days   Oren Binet, MD  [facility]    To contact the attending provider between 7A-7P or the covering provider during after hours 7P-7A, please log into the web site [email or web address] and access using universal [place] password for that web site. If you do not have the password, please call the hospital operator.  02/02/2022, 11:49 AM

## 2022-02-02 NOTE — H&P (View-Only) (Signed)
PROGRESS NOTE        PATIENT DETAILS Name: Cathy [name] Age: 41 y.o. Sex: female Date of Birth: Aug 27, 1981 Admit Date: 01/29/2022 Admitting Physician Christel Mormon, MD PCP:Pcp, No  Brief Summary: Patient is a 41 y.o.  female with history of ESRD, lupus, HTN, HLD-history of chronic under dialysis-numerous recent hospitalizations-presented with fever-found to have MRSA bacteremia.  Significant Hospital events: 1/13-1/17>> RLL pneumonia, large parapneumonic effusion, s/p chest tube and cefepime  1/26-1/30>> RML pneumonia, pericarditis/ effusion (? Lupus vs uremic), and lupus flare 2/5/-2/9>> hypertensive urgency, PE, EF down to 20-25% 3/1>> admit for fever-found to have MRSA bacteremia. 3/1>> tunneled HD catheter removed after dialysis. 3/4>> tunneled HD catheter placed by IR  Significant imaging studies: 3/1>> CXR: Small bilateral pleural effusions. 3/2>> Echo: No obvious vegetations, EF 35-40%.  Grade 3 diastolic dysfunction.  Trivial pericardial effusion.  Significant microbiology data: 3/1>> COVID/influenza PCR: Positive 3/1>> blood culture: MRSA 3/2>> blood culture: Negative.  Procedures: 3/1>> removal of tunneled dialysis catheter by IR. 3/4>> tunneled dialysis catheter placement by IR.  Consults:  ID, IR, nephrology  Subjective: Lying comfortably in bed-denies any chest pain or shortness of breath.  Objective: Vitals: Blood pressure (!) 141/104, pulse 69, temperature 98.7 F (37.1 C), temperature source Oral, resp. rate 20, height 5\' 7"  (1.702 m), weight 69.1 kg, SpO2 95 %, unknown if currently breastfeeding.   Exam: Gen Exam:Alert awake-not in any distress HEENT:atraumatic, normocephalic Chest: B/L clear to auscultation anteriorly CVS:S1S2 regular Abdomen:soft non tender, non distended Extremities:no edema Neurology: Non focal Skin: no rash   Pertinent Labs/Radiology: CBC Latest Ref Rng & Units 02/02/2022 02/01/2022 01/31/2022  WBC  4.0 - 10.5 K/uL 13.3(H) 11.7(H) 13.2(H)  Hemoglobin 12.0 - 15.0 g/dL 8.9(L) 9.2(L) 9.3(L)  Hematocrit 36.0 - 46.0 % 28.7(L) 29.2(L) 29.8(L)  Platelets 150 - 400 K/uL 174 137(L) 136(L)    Lab Results  Component Value Date   NA 134 (L) 02/02/2022   K 3.9 02/02/2022   CL 95 (L) 02/02/2022   CO2 25 02/02/2022      Assessment/Plan: SIRS due to MRSA bacteremia: Clinically improved-afebrile-leukocytosis has resolved-repeat blood cultures on 3/2 negative so far.  On IV vancomycin with hemodialysis.  Given line holiday-after removal of TDC on 3/1, since cultures were negative-TDC reinserted on 3/4.  TEE planned for 3/6.  ID following.  ESRD on HD TTS: Nephrology following.  Longstanding history of under dialysis/noncompliance.  Vascular surgery following for permanent access.  Hyperkalemia: Resolved after HD on 3/4.  Normocytic anemia: Due to CKD-defer Aranesp/iron therapy to nephrology.  Chronic combined systolic and diastolic heart failure: Trace lower extremity edema but otherwise stable.  Diuresis with HD.  Will need cardiology input at some point in time when she is a bit more stable-echo findings suspicious for infiltrative cardiomyopathy.    Pericardial effusion: Appears to be a chronic issue-suspect related to poor compliance to dialysis/under dialysis.  Echo on 3/2 showed small pericardial effusion.  Pulmonary embolism: Remains on Eliquis.  HTN: BP stable-continue amlodipine, Coreg, clonidine  History of lupus: Appears stable-continue Plaquenil and prednisone  Elevated point-of-care beta-hCG: Likely false positive elevation-Per patient-she has not had her menstrual period for close to a year-and she has not engage in sexual intercourse for "years".  Formal beta hCG not elevated.  Tobacco abuse: Encourage cessation-on transdermal nicotine.  Debility/deconditioning: Some of this is chronic-likely worsened by  MRSA bacteremia.  Mobilize with rehab services and see how she  does.  BMI: Estimated body mass index is 23.86 kg/m as calculated from the following:   Height as of this encounter: 5\' 7"  (1.702 m).   Weight as of this encounter: 69.1 kg.   Code status:   Code Status: Full Code   DVT Prophylaxis: apixaban (ELIQUIS) tablet 5 mg    Family Communication: None at bedside  Disposition Plan: Status is: Inpatient Remains inpatient appropriate because: MRSA bacteremia-on IV vancomycin-work-up in progress-not yet stable for discharge.   Planned Discharge Destination:Home   Diet: Diet Order             Diet NPO time specified Except for: Sips with Meds  Diet effective midnight           Diet renal with fluid restriction Fluid restriction: 1200 mL Fluid; Room service appropriate? Yes; Fluid consistency: Thin  Diet effective now                     Antimicrobial agents: Anti-infectives (From admission, onward)    Start     Dose/Rate Route Frequency Ordered Stop   02/01/22 1433  vancomycin (VANCOREADY) IVPB 750 mg/150 mL        750 mg 150 mL/hr over 60 Minutes Intravenous Every Dialysis 02/01/22 1435 02/01/22 1928   02/01/22 0000  ceFAZolin (ANCEF) IVPB 2g/100 mL premix       Note to Pharmacy: Pls contact [place] or Jannifer Franklin if you have question over the weekend.   2 g 200 mL/hr over 30 Minutes Intravenous To Radiology 01/31/22 1555 02/01/22 1607   01/30/22 2200  ceFEPIme (MAXIPIME) 1 g in sodium chloride 0.9 % 100 mL IVPB  Status:  Discontinued        1 g 200 mL/hr over 30 Minutes Intravenous Every 24 hours 01/29/22 0606 01/29/22 1453   01/30/22 1200  vancomycin (VANCOCIN) 750 mg in sodium chloride 0.9 % 250 mL IVPB  Status:  Discontinued        750 mg 250 mL/hr over 60 Minutes Intravenous Every T-Th-Sa (Hemodialysis) 01/29/22 0606 01/30/22 0806   01/30/22 1000  hydroxychloroquine (PLAQUENIL) tablet 100 mg        100 mg Oral Every 48 hours 01/29/22 0904     01/30/22 0900  vancomycin (VANCOREADY) IVPB 750 mg/150 mL        750  mg 150 mL/hr over 60 Minutes Intravenous  Once 01/30/22 0806 01/30/22 1056   01/30/22 0806  vancomycin variable dose per unstable renal function (pharmacist dosing)         Does not apply See admin instructions 01/30/22 0806     01/29/22 0615  ceFEPIme (MAXIPIME) 2 g in sodium chloride 0.9 % 100 mL IVPB        2 g 200 mL/hr over 30 Minutes Intravenous  Once 01/29/22 0600 01/29/22 0722   01/29/22 0615  metroNIDAZOLE (FLAGYL) IVPB 500 mg        500 mg 100 mL/hr over 60 Minutes Intravenous  Once 01/29/22 0600 01/29/22 0903   01/29/22 0615  vancomycin (VANCOCIN) IVPB 1000 mg/200 mL premix  Status:  Discontinued        1,000 mg 200 mL/hr over 60 Minutes Intravenous  Once 01/29/22 0600 01/29/22 0604   01/29/22 0615  Vancomycin (VANCOCIN) 1,250 mg in sodium chloride 0.9 % 250 mL IVPB        1,250 mg 166.7 mL/hr over 90 Minutes Intravenous  Once 01/29/22  0604 01/29/22 0934        MEDICATIONS: Scheduled Meds:  amLODipine  10 mg Oral Daily   apixaban  5 mg Oral BID   calcium acetate  1,334 mg Oral TID WC   carvedilol  12.5 mg Oral BID WC   Chlorhexidine Gluconate Cloth  6 each Topical Q0600   cloNIDine  0.3 mg Oral TID   colchicine  0.3 mg Oral Daily   [START ON 02/04/2022] darbepoetin (ARANESP) injection - DIALYSIS  200 mcg Intravenous Q Tue-HD   feeding supplement (NEPRO CARB STEADY)  237 mL Oral BID BM   hydrALAZINE  100 mg Oral TID   hydroxychloroquine  100 mg Oral Q48H   nicotine  14 mg Transdermal Daily   predniSONE  40 mg Oral Q breakfast   sodium chloride flush  3 mL Intravenous Q12H   sodium zirconium cyclosilicate  10 g Oral Daily   vancomycin variable dose per unstable renal function (pharmacist dosing)   Does not apply See admin instructions   Continuous Infusions: PRN Meds:.acetaminophen **OR** acetaminophen, albuterol, calcium carbonate (dosed in mg elemental calcium), docusate sodium, feeding supplement (NEPRO CARB STEADY), HYDROmorphone (DILAUDID) injection, hydrOXYzine,  methocarbamol, ondansetron **OR** ondansetron (ZOFRAN) IV, oxyCODONE, sorbitol, zolpidem   I have personally reviewed following labs and imaging studies  LABORATORY DATA: CBC: Recent Labs  Lab 01/29/22 0246 01/30/22 0432 01/31/22 0202 02/01/22 0100 02/02/22 0203  WBC 15.4* 11.8* 13.2* 11.7* 13.3*  NEUTROABS 14.2*  --   --   --   --   HGB 10.9* 10.0* 9.3* 9.2* 8.9*  HCT 35.1* 32.1* 29.8* 29.2* 28.7*  MCV 93.9 91.7 91.7 91.8 90.8  PLT 176 155 136* 137* 174     Basic Metabolic Panel: Recent Labs  Lab 01/29/22 0246 01/30/22 0432 01/31/22 0202 02/01/22 0100 02/01/22 1121 02/02/22 0203  NA 136 134* 130* 130*  --  134*  K 5.9* 4.7 4.9 5.7* 5.1 3.9  CL 96* 95* 93* 93*  --  95*  CO2 22 25 23  21*  --  25  GLUCOSE 93 89 118* 104*  --  98  BUN 85* 42* 58* 82*  --  42*  CREATININE 9.33* 5.87* 6.91* 7.71*  --  4.87*  CALCIUM 8.6* 7.4* 7.7* 8.1*  --  7.5*  PHOS  --   --  5.1* 4.0  --  3.2     GFR: Estimated Creatinine Clearance: 15.2 mL/min (A) (by C-G formula based on SCr of 4.87 mg/dL (H)).  Liver Function Tests: Recent Labs  Lab 01/29/22 0246 01/31/22 0202 02/01/22 0100 02/02/22 0203  AST 30  --   --   --   ALT 44  --   --   --   ALKPHOS 192*  --   --   --   BILITOT 0.9  --   --   --   PROT 6.2*  --   --   --   ALBUMIN 2.8* 2.5* 2.6* 2.4*    No results for input(s): LIPASE, AMYLASE in the last 168 hours. No results for input(s): AMMONIA in the last 168 hours.  Coagulation Profile: Recent Labs  Lab 01/29/22 0246  INR 2.1*     Cardiac Enzymes: No results for input(s): CKTOTAL, CKMB, CKMBINDEX, TROPONINI in the last 168 hours.  BNP (last 3 results) No results for input(s): PROBNP in the last 8760 hours.  Lipid Profile: No results for input(s): CHOL, HDL, LDLCALC, TRIG, CHOLHDL, LDLDIRECT in the last 72 hours.  Thyroid Function Tests:  No results for input(s): TSH, T4TOTAL, FREET4, T3FREE, THYROIDAB in the last 72 hours.  Anemia Panel: No results  for input(s): VITAMINB12, FOLATE, FERRITIN, TIBC, IRON, RETICCTPCT in the last 72 hours.  Urine analysis:    Component Value Date/Time   COLORURINE YELLOW 11/29/2020 2054   APPEARANCEUR HAZY (A) 11/29/2020 2054   LABSPEC 1.015 11/29/2020 2054   PHURINE 6.0 11/29/2020 2054   GLUCOSEU NEGATIVE 11/29/2020 2054   HGBUR MODERATE (A) 11/29/2020 2054   BILIRUBINUR NEGATIVE 11/29/2020 2054   KETONESUR NEGATIVE 11/29/2020 2054   PROTEINUR >=300 (A) 11/29/2020 2054   UROBILINOGEN 0.2 10/17/2020 1026   NITRITE NEGATIVE 11/29/2020 2054   LEUKOCYTESUR NEGATIVE 11/29/2020 2054    Sepsis Labs: Lactic Acid, Venous    Component Value Date/Time   LATICACIDVEN 3.1 (HH) 01/29/2022 0748    MICROBIOLOGY: Recent Results (from the past 240 hour(s))  Resp Panel by RT-PCR (Flu A&B, Covid) Nasopharyngeal Swab     Status: None   Collection Time: 01/29/22  2:18 AM   Specimen: Nasopharyngeal Swab; Nasopharyngeal(NP) swabs in vial transport medium  Result Value Ref Range Status   SARS Coronavirus 2 by RT PCR NEGATIVE NEGATIVE Final    Comment: (NOTE) SARS-CoV-2 target nucleic acids are NOT DETECTED.  The SARS-CoV-2 RNA is generally detectable in upper respiratory specimens during the acute phase of infection. The lowest concentration of SARS-CoV-2 viral copies this assay can detect is 138 copies/mL. A negative result does not preclude SARS-Cov-2 infection and should not be used as the sole basis for treatment or other patient management decisions. A negative result may occur with  improper specimen collection/handling, submission of specimen other than nasopharyngeal swab, presence of viral mutation(s) within the areas targeted by this assay, and inadequate number of viral copies(<138 copies/mL). A negative result must be combined with clinical observations, patient history, and epidemiological information. The expected result is Negative.  Fact Sheet for Patients:   EntrepreneurPulse.com.au  Fact Sheet for Healthcare Providers:  IncredibleEmployment.be  This test is no t yet approved or cleared by the [place] FDA and  has been authorized for detection and/or diagnosis of SARS-CoV-2 by FDA under an Emergency Use Authorization (EUA). This EUA will remain  in effect (meaning this test can be used) for the duration of the COVID-19 declaration under Section 564(b)(1) of the Act, 21 U.S.C.section 360bbb-3(b)(1), unless the authorization is terminated  or revoked sooner.       Influenza A by PCR NEGATIVE NEGATIVE Final   Influenza B by PCR NEGATIVE NEGATIVE Final    Comment: (NOTE) The Xpert Xpress SARS-CoV-2/FLU/RSV plus assay is intended as an aid in the diagnosis of influenza from Nasopharyngeal swab specimens and should not be used as a sole basis for treatment. Nasal washings and aspirates are unacceptable for Xpert Xpress SARS-CoV-2/FLU/RSV testing.  Fact Sheet for Patients: EntrepreneurPulse.com.au  Fact Sheet for Healthcare Providers: IncredibleEmployment.be  This test is not yet approved or cleared by the [place] FDA and has been authorized for detection and/or diagnosis of SARS-CoV-2 by FDA under an Emergency Use Authorization (EUA). This EUA will remain in effect (meaning this test can be used) for the duration of the COVID-19 declaration under Section 564(b)(1) of the Act, 21 U.S.C. section 360bbb-3(b)(1), unless the authorization is terminated or revoked.  Performed at [facility], [place] [address], [place], [place] [address]   Blood Culture (routine x 2)     Status: Abnormal   Collection Time: 01/29/22  2:30 AM   Specimen: BLOOD LEFT  ARM  Result Value Ref Range Status   Specimen Description BLOOD LEFT ARM  Final   Special Requests   Final    BOTTLES DRAWN AEROBIC AND ANAEROBIC Blood Culture results may not be optimal due to an excessive  volume of blood received in culture bottles   Culture  Setup Time   Final    GRAM POSITIVE COCCI IN CLUSTERS IN BOTH AEROBIC AND ANAEROBIC BOTTLES CRITICAL RESULT CALLED TO, READ BACK BY AND VERIFIED WITH: PHARMD M.WEISKOPH AT 0814 ON 01/29/2022 BY T.SAAD. Performed at [facility], [place] [address], [place], [place] [address]    Culture METHICILLIN RESISTANT STAPHYLOCOCCUS AUREUS (A)  Final   Report Status 01/31/2022 FINAL  Final   Organism ID, Bacteria METHICILLIN RESISTANT STAPHYLOCOCCUS AUREUS  Final      Susceptibility   Methicillin resistant staphylococcus aureus - MIC*    CIPROFLOXACIN <=0.5 SENSITIVE Sensitive     ERYTHROMYCIN >=8 RESISTANT Resistant     GENTAMICIN <=0.5 SENSITIVE Sensitive     OXACILLIN >=4 RESISTANT Resistant     TETRACYCLINE <=1 SENSITIVE Sensitive     VANCOMYCIN 1 SENSITIVE Sensitive     TRIMETH/SULFA <=10 SENSITIVE Sensitive     CLINDAMYCIN <=0.25 SENSITIVE Sensitive     RIFAMPIN <=0.5 SENSITIVE Sensitive     Inducible Clindamycin NEGATIVE Sensitive     * METHICILLIN RESISTANT STAPHYLOCOCCUS AUREUS  Blood Culture ID Panel (Reflexed)     Status: Abnormal   Collection Time: 01/29/22  2:30 AM  Result Value Ref Range Status   Enterococcus faecalis NOT DETECTED NOT DETECTED Final   Enterococcus Faecium NOT DETECTED NOT DETECTED Final   Listeria monocytogenes NOT DETECTED NOT DETECTED Final   Staphylococcus species DETECTED (A) NOT DETECTED Final    Comment: CRITICAL RESULT CALLED TO, READ BACK BY AND VERIFIED WITH: PHARMD M.WEISKOPH AT 1449 ON 01/29/2022 BY T.SAAD.    Staphylococcus aureus (BCID) DETECTED (A) NOT DETECTED Final    Comment: Methicillin (oxacillin)-resistant Staphylococcus aureus (MRSA). MRSA is predictably resistant to beta-lactam antibiotics (except ceftaroline). Preferred therapy is vancomycin unless clinically contraindicated. Patient requires contact precautions if  hospitalized. CRITICAL RESULT CALLED TO, READ BACK BY AND VERIFIED  WITH: PHARMD M.WEISKOPH AT 1449 ON 01/29/2022 BY T.SAAD.    Staphylococcus epidermidis NOT DETECTED NOT DETECTED Final   Staphylococcus lugdunensis NOT DETECTED NOT DETECTED Final   Streptococcus species NOT DETECTED NOT DETECTED Final   Streptococcus agalactiae NOT DETECTED NOT DETECTED Final   Streptococcus pneumoniae NOT DETECTED NOT DETECTED Final   Streptococcus pyogenes NOT DETECTED NOT DETECTED Final   A.calcoaceticus-baumannii NOT DETECTED NOT DETECTED Final   Bacteroides fragilis NOT DETECTED NOT DETECTED Final   Enterobacterales NOT DETECTED NOT DETECTED Final   Enterobacter cloacae complex NOT DETECTED NOT DETECTED Final   Escherichia coli NOT DETECTED NOT DETECTED Final   Klebsiella aerogenes NOT DETECTED NOT DETECTED Final   Klebsiella oxytoca NOT DETECTED NOT DETECTED Final   Klebsiella pneumoniae NOT DETECTED NOT DETECTED Final   Proteus species NOT DETECTED NOT DETECTED Final   Salmonella species NOT DETECTED NOT DETECTED Final   Serratia marcescens NOT DETECTED NOT DETECTED Final   Haemophilus influenzae NOT DETECTED NOT DETECTED Final   Neisseria meningitidis NOT DETECTED NOT DETECTED Final   Pseudomonas aeruginosa NOT DETECTED NOT DETECTED Final   Stenotrophomonas maltophilia NOT DETECTED NOT DETECTED Final   Candida albicans NOT DETECTED NOT DETECTED Final   Candida auris NOT DETECTED NOT DETECTED Final   Candida glabrata NOT DETECTED NOT DETECTED Final  Candida krusei NOT DETECTED NOT DETECTED Final   Candida parapsilosis NOT DETECTED NOT DETECTED Final   Candida tropicalis NOT DETECTED NOT DETECTED Final   Cryptococcus neoformans/gattii NOT DETECTED NOT DETECTED Final   Meth resistant mecA/C and MREJ DETECTED (A) NOT DETECTED Final    Comment: CRITICAL RESULT CALLED TO, READ BACK BY AND VERIFIED WITH: PHARMD M.WEISKOPH AT 1449 ON 01/29/2022 BY T.SAAD. Performed at [facility], [place] [address] [place], [place] [address]   Blood Culture (routine x 2)      Status: Abnormal   Collection Time: 01/29/22  2:40 AM   Specimen: BLOOD LEFT HAND  Result Value Ref Range Status   Specimen Description BLOOD LEFT HAND  Final   Special Requests   Final    BOTTLES DRAWN AEROBIC ONLY Blood Culture results may not be optimal due to an excessive volume of blood received in culture bottles   Culture  Setup Time   Final    GRAM POSITIVE COCCI IN CLUSTERS AEROBIC BOTTLE ONLY CRITICAL VALUE NOTED.  VALUE IS CONSISTENT WITH PREVIOUSLY REPORTED AND CALLED VALUE.    Culture (A)  Final    STAPHYLOCOCCUS AUREUS SUSCEPTIBILITIES PERFORMED ON PREVIOUS CULTURE WITHIN THE LAST 5 DAYS. Performed at [facility], [place] [address], [place], [place] [address]    Report Status 01/31/2022 FINAL  Final  Culture, blood (single)     Status: Abnormal   Collection Time: 01/29/22  5:56 AM   Specimen: BLOOD  Result Value Ref Range Status   Specimen Description BLOOD CENTRAL LINE  Final   Special Requests   Final    BOTTLES DRAWN AEROBIC AND ANAEROBIC Blood Culture adequate volume   Culture  Setup Time   Final    GRAM POSITIVE COCCI IN CLUSTERS IN BOTH AEROBIC AND ANAEROBIC BOTTLES CRITICAL VALUE NOTED.  VALUE IS CONSISTENT WITH PREVIOUSLY REPORTED AND CALLED VALUE.    Culture (A)  Final    STAPHYLOCOCCUS AUREUS SUSCEPTIBILITIES PERFORMED ON PREVIOUS CULTURE WITHIN THE LAST 5 DAYS. Performed at [facility], [place] [address] [place], [place] [address]    Report Status 01/31/2022 FINAL  Final  Culture, blood (routine x 2)     Status: None (Preliminary result)   Collection Time: 01/30/22  4:32 AM   Specimen: BLOOD  Result Value Ref Range Status   Specimen Description BLOOD LEFT ANTECUBITAL  Final   Special Requests   Final    BOTTLES DRAWN AEROBIC AND ANAEROBIC Blood Culture adequate volume   Culture   Final    NO GROWTH 2 DAYS Performed at [facility], [place] [address], [place], [place] [address]    Report Status PENDING  Incomplete  Culture, blood  (routine x 2)     Status: None (Preliminary result)   Collection Time: 01/30/22  4:45 AM   Specimen: BLOOD  Result Value Ref Range Status   Specimen Description BLOOD BLOOD RIGHT WRIST  Final   Special Requests   Final    BOTTLES DRAWN AEROBIC AND ANAEROBIC Blood Culture adequate volume   Culture   Final    NO GROWTH 2 DAYS Performed at [facility], [place] [address] [place], [place] [address]    Report Status PENDING  Incomplete    RADIOLOGY STUDIES/RESULTS: VAS [place] UPPER EXT VEIN MAPPING (PRE-OP AVF)  Result Date: 02/02/2022 UPPER EXTREMITY VEIN MAPPING Patient Name:  WANIA LONGSTRETH  Date of Exam:   02/01/2022 Medical Rec #: [id number]  Accession #:    [id number] Date of Birth: [birth year]        Patient Gender: F Patient Age:   6 years Exam Location:  [facility] Procedure:      VAS [place] UPPER EXT VEIN MAPPING (PRE-OP AVF) Referring Phys: Aldona Bar RHYNE --------------------------------------------------------------------------------  Indications: Pre-access. Comparison Study: No prior studies. Performing Technologist: Oliver Hum RVT  Examination Guidelines: A complete evaluation includes B-mode imaging, spectral Doppler, color Doppler, and power Doppler as needed of all accessible portions of each vessel. Bilateral testing is considered an integral part of a complete examination. Limited examinations for reoccurring indications may be performed as noted. +-----------------+-------------+----------+---------+  Right Cephalic    Diameter (cm) Depth (cm) Findings   +-----------------+-------------+----------+---------+  Shoulder              0.34         0.46               +-----------------+-------------+----------+---------+  Prox upper arm        0.35         0.23               +-----------------+-------------+----------+---------+  Mid upper arm         0.38         0.14               +-----------------+-------------+----------+---------+  Dist upper arm        0.38          0.13               +-----------------+-------------+----------+---------+  Antecubital fossa     0.44         0.15    branching  +-----------------+-------------+----------+---------+  Prox forearm          0.24         0.28    branching  +-----------------+-------------+----------+---------+  Mid forearm           0.06         0.14               +-----------------+-------------+----------+---------+  Dist forearm          0.06         0.12               +-----------------+-------------+----------+---------+ +-----------------+-------------+----------+---------+  Right Basilic     Diameter (cm) Depth (cm) Findings   +-----------------+-------------+----------+---------+  Shoulder              0.42         0.83               +-----------------+-------------+----------+---------+  Mid upper arm         0.45         0.47               +-----------------+-------------+----------+---------+  Dist upper arm        0.46         0.37    branching  +-----------------+-------------+----------+---------+  Antecubital fossa     0.19         0.12               +-----------------+-------------+----------+---------+  Prox forearm          0.25         0.18    branching  +-----------------+-------------+----------+---------+  Mid forearm           0.20  0.09               +-----------------+-------------+----------+---------+  Distal forearm        0.22         0.08               +-----------------+-------------+----------+---------+ +-----------------+-------------+----------+---------+  Left Cephalic     Diameter (cm) Depth (cm) Findings   +-----------------+-------------+----------+---------+  Shoulder              0.35         0.85               +-----------------+-------------+----------+---------+  Prox upper arm        0.36         0.20               +-----------------+-------------+----------+---------+  Mid upper arm         0.32         0.15    branching  +-----------------+-------------+----------+---------+  Dist  upper arm        0.32         0.23               +-----------------+-------------+----------+---------+  Antecubital fossa     0.26         0.22    branching  +-----------------+-------------+----------+---------+  Prox forearm          0.24         0.14    branching  +-----------------+-------------+----------+---------+  Mid forearm           0.22         0.21    branching  +-----------------+-------------+----------+---------+  Dist forearm          0.19         0.10               +-----------------+-------------+----------+---------+ +-----------------+-------------+----------+---------+  Left Basilic      Diameter (cm) Depth (cm) Findings   +-----------------+-------------+----------+---------+  Shoulder              0.29         0.53               +-----------------+-------------+----------+---------+  Mid upper arm         0.33         0.51               +-----------------+-------------+----------+---------+  Dist upper arm        0.42         0.37    branching  +-----------------+-------------+----------+---------+  Antecubital fossa     0.32         0.20               +-----------------+-------------+----------+---------+  Prox forearm          0.19         0.15    branching  +-----------------+-------------+----------+---------+  Mid forearm           0.13         0.08               +-----------------+-------------+----------+---------+  Distal forearm        0.10         0.09               +-----------------+-------------+----------+---------+ *See table(s) above for measurements and observations.  Diagnosing physician: Orlie Pollen Electronically signed by Orlie Pollen on 02/02/2022 at 10:13:07  AM.    Final    IR TUNNELED CENTRAL VENOUS CATHETER PLACEMENT  Result Date: 02/01/2022 INDICATION: Need for dialysis access EXAM: Ultrasound-guided puncture of the right internal jugular vein Placement of a tunneled hemodialysis catheter using fluoroscopic guidance MEDICATIONS: Per EMR ANESTHESIA/SEDATION:  Moderate (conscious) sedation was employed during this procedure. A total of Versed 2.5 mg and Fentanyl 100 mcg was administered intravenously. Moderate Sedation Time: 20 minutes. The patient's level of consciousness and vital signs were monitored continuously by radiology nursing throughout the procedure under my direct supervision. FLUOROSCOPY TIME:  Fluoroscopy Time: 0.4 minutes (1 mGy) COMPLICATIONS: None immediate. PROCEDURE: Informed written consent was obtained from the patient after a thorough discussion of the procedural risks, benefits and alternatives. All questions were addressed. Maximal Sterile Barrier Technique was utilized including caps, mask, sterile gowns, sterile gloves, sterile drape, hand hygiene and skin antiseptic. A timeout was performed prior to the initiation of the procedure. The patient was placed supine on the exam table. The right neck and chest was prepped and draped in the standard sterile fashion. Ultrasound was used to evaluate the right internal jugular vein, which found to be patent and suitable for access. An ultrasound image was permanently stored in the electronic medical record. Using ultrasound guidance, the right internal jugular vein was directly punctured using a 21 gauge micropuncture set. An 018 wire was advanced into the SVC, followed by serial tract dilation and placement of an 035 wire which was advanced into the IVC. Attention was then turned to an infraclavicular location, where a small dermatotomy was made after the administration of additional local anesthetic. From this location, a 19 cm tip-to-cuff hemodialysis catheter was tunneled to the venotomy site. A peel-away sheath was then advanced over the access wire. Through this peel-away sheath, the hemodialysis catheter was advanced into the central veins, such that the tip was positioned near the superior cavoatrial junction. The line was found to flush and aspirate appropriately. It was sutured to the skin  using 0 silk suture, and the venotomy was closed with 4-0 Vicryl and Dermabond. A sterile dressing was placed. The patient tolerated the procedure well without immediate complication. IMPRESSION: Successful placement of a tunneled hemodialysis catheter using the right internal jugular vein. The line is ready for immediate use. Electronically Signed   By: Albin Felling M.D.   On: 02/01/2022 13:57     LOS: 4 days   Oren Binet, MD  [facility]    To contact the attending provider between 7A-7P or the covering provider during after hours 7P-7A, please log into the web site [email or web address] and access using universal [place] password for that web site. If you do not have the password, please call the hospital operator.  02/02/2022, 11:49 AM

## 2022-02-02 NOTE — Progress Notes (Addendum)
Subjective: Seen in room no complaints said tolerated dialysis yesterday 3.5 L UF via RIJ PermCath IR inserted, TEE for tomorrow, noted VVS plans left AVF creation Wednesday  Objective Vital signs in last 24 hours: Vitals:   02/01/22 2117 02/01/22 2327 02/02/22 0438 02/02/22 0841  BP: (!) 135/95 (!) 137/92 (!) 163/118 (!) 141/104  Pulse:  68 63 69  Resp:  19 (!) 21 20  Temp:  98.9 F (37.2 C) 98.5 F (36.9 C) 98.7 F (37.1 C)  TempSrc:  Oral Oral Oral  SpO2:  98% 95% 95%  Weight:      Height:       Weight change:   Physical Exam: General: Alert young female NAD Heart: RRR, no MRG  Lungs: CTA nonlabored breathing Abdomen: NABS, mild ascites, NT, soft Extremities: Trace bipedal lower extremity dialysis Access: No current access, prior site dry clear TDC was removed 3/01/line holiday   Dialysis Orders: Dialyzes at [facility] TTS 4 hrs EDW 52 kg 2K/ 2 Ca bath BFR 400 via TDC, DFR 800 Hectorol 3 q rx Venofer 100 q rx x 10, ending 02/08/22 Mircera 225 q 2 weeks, last given 01/25/22   Problem/Plan: 1 SIRS due to MRSA bacteremia sepsis-on vancomycin, ID following, TEE for tomorrow, TDC removed by IR 3/1-had line holiday /and reinsert and HD 3/04, repeat BC X 2 recheck 3/02, no growth so far 2 ESRD: TTS HD, yest on sched., a.m. lab K3.9// noted. = Patient agrees for arm access now, VVS consulting plans for Wednesday AV fistula  3 Hypertension/volume-BP has improved since UF on HD, BP meds now still needed with hypertension and some volume on exam, weights are not corresponding to HD, 3 L UF 3/01 HD no change in post weight 63.5, chest x-ray 3/01Small bilateral pleural effusions with bibasilar atelectasis.  Had 3.5 L UF yesterday continue UF as able next HD 4. 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  Lupus; on pred and plaquenil 7.   Nutrition= albumin 2 4 add protein supplement to diet  Ernest Haber, PA-C [place] [phone] 02/02/2022,10:41 AM  LOS: 4 days   Labs: Basic Metabolic Panel: Recent Labs  Lab 01/31/22 0202 02/01/22 0100 02/01/22 1121 02/02/22 0203  NA 130* 130*  --  134*  K 4.9 5.7* 5.1 3.9  CL 93* 93*  --  95*  CO2 23 21*  --  25  GLUCOSE 118* 104*  --  98  BUN 58* 82*  --  42*  CREATININE 6.91* 7.71*  --  4.87*  CALCIUM 7.7* 8.1*  --  7.5*  PHOS 5.1* 4.0  --  3.2   Liver Function Tests: Recent Labs  Lab 01/29/22 0246 01/31/22 0202 02/01/22 0100 02/02/22 0203  AST 30  --   --   --   ALT 44  --   --   --   ALKPHOS 192*  --   --   --   BILITOT 0.9  --   --   --   PROT 6.2*  --   --   --   ALBUMIN 2.8* 2.5* 2.6* 2.4*   No results for input(s): LIPASE, AMYLASE in the last 168 hours. No results for input(s): AMMONIA in the last 168 hours. CBC: Recent Labs  Lab 01/29/22 0246 01/30/22 0432 01/31/22 0202 02/01/22 0100  02/02/22 0203  WBC 15.4* 11.8* 13.2* 11.7* 13.3*  NEUTROABS 14.2*  --   --   --   --   HGB 10.9* 10.0* 9.3* 9.2* 8.9*  HCT 35.1* 32.1* 29.8* 29.2* 28.7*  MCV 93.9 91.7 91.7 91.8 90.8  PLT 176 155 136* 137* 174   Cardiac Enzymes: No results for input(s): CKTOTAL, CKMB, CKMBINDEX, TROPONINI in the last 168 hours. CBG: No results for input(s): GLUCAP in the last 168 hours.  Studies/Results: VAS [place] UPPER EXT VEIN MAPPING (PRE-OP AVF)  Result Date: 02/02/2022 UPPER EXTREMITY VEIN MAPPING Patient Name:  Cathy [name]  Date of Exam:   02/01/2022 Medical Rec #: [id number]         Accession #:    [id number] Date of Birth: [birth year]        Patient Gender: F Patient Age:   41 years Exam Location:  [facility] Procedure:      VAS [place] UPPER EXT VEIN MAPPING (PRE-OP AVF) Referring Phys: Aldona Bar RHYNE --------------------------------------------------------------------------------  Indications: Pre-access. Comparison Study: No prior studies.  Performing Technologist: Oliver Hum RVT  Examination Guidelines: A complete evaluation includes B-mode imaging, spectral Doppler, color Doppler, and power Doppler as needed of all accessible portions of each vessel. Bilateral testing is considered an integral part of a complete examination. Limited examinations for reoccurring indications may be performed as noted. +-----------------+-------------+----------+---------+  Right Cephalic    Diameter (cm) Depth (cm) Findings   +-----------------+-------------+----------+---------+  Shoulder              0.34         0.46               +-----------------+-------------+----------+---------+  Prox upper arm        0.35         0.23               +-----------------+-------------+----------+---------+  Mid upper arm         0.38         0.14               +-----------------+-------------+----------+---------+  Dist upper arm        0.38         0.13               +-----------------+-------------+----------+---------+  Antecubital fossa     0.44         0.15    branching  +-----------------+-------------+----------+---------+  Prox forearm          0.24         0.28    branching  +-----------------+-------------+----------+---------+  Mid forearm           0.06         0.14               +-----------------+-------------+----------+---------+  Dist forearm          0.06         0.12               +-----------------+-------------+----------+---------+ +-----------------+-------------+----------+---------+  Right Basilic     Diameter (cm) Depth (cm) Findings   +-----------------+-------------+----------+---------+  Shoulder              0.42         0.83               +-----------------+-------------+----------+---------+  Mid upper arm         0.45  0.47               +-----------------+-------------+----------+---------+  Dist upper arm        0.46         0.37    branching  +-----------------+-------------+----------+---------+  Antecubital fossa     0.19          0.12               +-----------------+-------------+----------+---------+  Prox forearm          0.25         0.18    branching  +-----------------+-------------+----------+---------+  Mid forearm           0.20         0.09               +-----------------+-------------+----------+---------+  Distal forearm        0.22         0.08               +-----------------+-------------+----------+---------+ +-----------------+-------------+----------+---------+  Left Cephalic     Diameter (cm) Depth (cm) Findings   +-----------------+-------------+----------+---------+  Shoulder              0.35         0.85               +-----------------+-------------+----------+---------+  Prox upper arm        0.36         0.20               +-----------------+-------------+----------+---------+  Mid upper arm         0.32         0.15    branching  +-----------------+-------------+----------+---------+  Dist upper arm        0.32         0.23               +-----------------+-------------+----------+---------+  Antecubital fossa     0.26         0.22    branching  +-----------------+-------------+----------+---------+  Prox forearm          0.24         0.14    branching  +-----------------+-------------+----------+---------+  Mid forearm           0.22         0.21    branching  +-----------------+-------------+----------+---------+  Dist forearm          0.19         0.10               +-----------------+-------------+----------+---------+ +-----------------+-------------+----------+---------+  Left Basilic      Diameter (cm) Depth (cm) Findings   +-----------------+-------------+----------+---------+  Shoulder              0.29         0.53               +-----------------+-------------+----------+---------+  Mid upper arm         0.33         0.51               +-----------------+-------------+----------+---------+  Dist upper arm        0.42         0.37    branching  +-----------------+-------------+----------+---------+   Antecubital fossa     0.32         0.20               +-----------------+-------------+----------+---------+  Prox forearm          0.19         0.15    branching  +-----------------+-------------+----------+---------+  Mid forearm           0.13         0.08               +-----------------+-------------+----------+---------+  Distal forearm        0.10         0.09               +-----------------+-------------+----------+---------+ *See table(s) above for measurements and observations.  Diagnosing physician: Orlie Pollen Electronically signed by Orlie Pollen on 02/02/2022 at 10:13:07 AM.    Final    IR TUNNELED CENTRAL VENOUS CATHETER PLACEMENT  Result Date: 02/01/2022 INDICATION: Need for dialysis access EXAM: Ultrasound-guided puncture of the right internal jugular vein Placement of a tunneled hemodialysis catheter using fluoroscopic guidance MEDICATIONS: Per EMR ANESTHESIA/SEDATION: Moderate (conscious) sedation was employed during this procedure. A total of Versed 2.5 mg and Fentanyl 100 mcg was administered intravenously. Moderate Sedation Time: 20 minutes. The patient's level of consciousness and vital signs were monitored continuously by radiology nursing throughout the procedure under my direct supervision. FLUOROSCOPY TIME:  Fluoroscopy Time: 0.4 minutes (1 mGy) COMPLICATIONS: None immediate. PROCEDURE: Informed written consent was obtained from the patient after a thorough discussion of the procedural risks, benefits and alternatives. All questions were addressed. Maximal Sterile Barrier Technique was utilized including caps, mask, sterile gowns, sterile gloves, sterile drape, hand hygiene and skin antiseptic. A timeout was performed prior to the initiation of the procedure. The patient was placed supine on the exam table. The right neck and chest was prepped and draped in the standard sterile fashion. Ultrasound was used to evaluate the right internal jugular vein, which found to be patent and  suitable for access. An ultrasound image was permanently stored in the electronic medical record. Using ultrasound guidance, the right internal jugular vein was directly punctured using a 21 gauge micropuncture set. An 018 wire was advanced into the SVC, followed by serial tract dilation and placement of an 035 wire which was advanced into the IVC. Attention was then turned to an infraclavicular location, where a small dermatotomy was made after the administration of additional local anesthetic. From this location, a 19 cm tip-to-cuff hemodialysis catheter was tunneled to the venotomy site. A peel-away sheath was then advanced over the access wire. Through this peel-away sheath, the hemodialysis catheter was advanced into the central veins, such that the tip was positioned near the superior cavoatrial junction. The line was found to flush and aspirate appropriately. It was sutured to the skin using 0 silk suture, and the venotomy was closed with 4-0 Vicryl and Dermabond. A sterile dressing was placed. The patient tolerated the procedure well without immediate complication. IMPRESSION: Successful placement of a tunneled hemodialysis catheter using the right internal jugular vein. The line is ready for immediate use. Electronically Signed   By: Albin Felling M.D.   On: 02/01/2022 13:57   Medications:   amLODipine  10 mg Oral Daily   apixaban  5 mg Oral BID   calcium acetate  1,334 mg Oral TID WC   carvedilol  12.5 mg Oral BID WC   Chlorhexidine Gluconate Cloth  6 each Topical Q0600   cloNIDine  0.3 mg Oral TID   colchicine  0.3 mg Oral Daily   hydrALAZINE  100 mg Oral TID   hydroxychloroquine  100 mg Oral Q48H   nicotine  14 mg Transdermal Daily   predniSONE  40 mg Oral Q breakfast   sodium chloride flush  3 mL Intravenous Q12H   sodium zirconium cyclosilicate  10 g Oral Daily   vancomycin variable dose per unstable renal function (pharmacist dosing)   Does not apply See admin instructions

## 2022-02-03 ENCOUNTER — Inpatient Hospital Stay (HOSPITAL_COMMUNITY): Payer: Medicare Other | Admitting: Certified Registered"

## 2022-02-03 ENCOUNTER — Encounter (HOSPITAL_COMMUNITY): Payer: Self-pay

## 2022-02-03 ENCOUNTER — Inpatient Hospital Stay (HOSPITAL_COMMUNITY): Payer: Medicare Other

## 2022-02-03 ENCOUNTER — Encounter (HOSPITAL_COMMUNITY)
Admission: EM | Disposition: A | Discharge status: Home / Self Care | Payer: Self-pay | Source: Home / Self Care | Attending: Internal Medicine

## 2022-02-03 DIAGNOSIS — I5040 Unspecified combined systolic (congestive) and diastolic (congestive) heart failure: Secondary | ICD-10-CM

## 2022-02-03 DIAGNOSIS — J90 Pleural effusion, not elsewhere classified: Secondary | ICD-10-CM

## 2022-02-03 DIAGNOSIS — Z8619 Personal history of other infectious and parasitic diseases: Secondary | ICD-10-CM

## 2022-02-03 DIAGNOSIS — I340 Nonrheumatic mitral (valve) insufficiency: Secondary | ICD-10-CM

## 2022-02-03 DIAGNOSIS — I130 Hypertensive heart and chronic kidney disease with heart failure and stage 1 through stage 4 chronic kidney disease, or unspecified chronic kidney disease: Secondary | ICD-10-CM

## 2022-02-03 DIAGNOSIS — B9562 Methicillin resistant Staphylococcus aureus infection as the cause of diseases classified elsewhere: Secondary | ICD-10-CM | POA: Diagnosis not present

## 2022-02-03 DIAGNOSIS — M3214 Glomerular disease in systemic lupus erythematosus: Secondary | ICD-10-CM | POA: Diagnosis not present

## 2022-02-03 DIAGNOSIS — Z992 Dependence on renal dialysis: Secondary | ICD-10-CM | POA: Diagnosis not present

## 2022-02-03 DIAGNOSIS — R7881 Bacteremia: Secondary | ICD-10-CM | POA: Diagnosis not present

## 2022-02-03 DIAGNOSIS — I071 Rheumatic tricuspid insufficiency: Secondary | ICD-10-CM

## 2022-02-03 DIAGNOSIS — I2699 Other pulmonary embolism without acute cor pulmonale: Secondary | ICD-10-CM | POA: Diagnosis not present

## 2022-02-03 DIAGNOSIS — I3139 Other pericardial effusion (noninflammatory): Secondary | ICD-10-CM

## 2022-02-03 DIAGNOSIS — N186 End stage renal disease: Secondary | ICD-10-CM | POA: Diagnosis not present

## 2022-02-03 DIAGNOSIS — I361 Nonrheumatic tricuspid (valve) insufficiency: Secondary | ICD-10-CM

## 2022-02-03 DIAGNOSIS — I509 Heart failure, unspecified: Secondary | ICD-10-CM

## 2022-02-03 HISTORY — PX: TEE WITHOUT CARDIOVERSION: SHX5443

## 2022-02-03 HISTORY — PX: IR US GUIDE VASC ACCESS RIGHT: IMG2390

## 2022-02-03 LAB — HEPATITIS B SURFACE ANTIBODY, QUANTITATIVE: Hep B S AB Quant (Post): 23.2 m[IU]/mL (ref >9.9)

## 2022-02-03 LAB — VANCOMYCIN, RANDOM: Vancomycin Rm: 18

## 2022-02-03 SURGERY — ECHOCARDIOGRAM, TRANSESOPHAGEAL
Anesthesia: Monitor Anesthesia Care

## 2022-02-03 MED ORDER — ALTEPLASE 2 MG IJ SOLR: 2.0000 mg | Freq: Once | INTRAMUSCULAR | Status: DC | PRN

## 2022-02-03 MED ORDER — VANCOMYCIN HCL 750 MG/150ML IV SOLN
750.0000 mg | INTRAVENOUS | Status: DC
  Administered 2022-02-04 – 2022-02-08 (×3): 750 mg via INTRAVENOUS
  Filled 2022-02-03 (×4): qty 150

## 2022-02-03 MED ORDER — HYDRALAZINE HCL 20 MG/ML IJ SOLN
10.0000 mg | Freq: Once | INTRAMUSCULAR | Status: AC
  Administered 2022-02-03: 10 mg via INTRAVENOUS

## 2022-02-03 MED ORDER — LIDOCAINE HCL (PF) 1 % IJ SOLN
5.0000 mL | INTRAMUSCULAR | Status: DC | PRN
  Filled 2022-02-03: qty 5

## 2022-02-03 MED ORDER — PROPOFOL 500 MG/50ML IV EMUL
INTRAVENOUS | Status: DC | PRN
  Administered 2022-02-03: 125 ug/kg/min via INTRAVENOUS

## 2022-02-03 MED ORDER — HEPARIN SODIUM (PORCINE) 1000 UNIT/ML DIALYSIS
1000.0000 [IU] | INTRAMUSCULAR | Status: DC | PRN
  Administered 2022-02-04: 3200 [IU] via INTRAVENOUS_CENTRAL
  Filled 2022-02-03: qty 1

## 2022-02-03 MED ORDER — CARVEDILOL 25 MG PO TABS
25.0000 mg | ORAL_TABLET | Freq: Two times a day (BID) | ORAL | Status: DC
  Administered 2022-02-03 – 2022-02-08 (×10): 25 mg via ORAL
  Filled 2022-02-03 (×10): qty 1

## 2022-02-03 MED ORDER — SODIUM CHLORIDE 0.9 % IV SOLN: 100.0000 mL | INTRAVENOUS | Status: DC | PRN

## 2022-02-03 MED ORDER — LIDOCAINE-PRILOCAINE 2.5-2.5 % EX CREA
1.0000 "application " | TOPICAL_CREAM | CUTANEOUS | Status: DC | PRN
  Filled 2022-02-03: qty 5

## 2022-02-03 MED ORDER — PENTAFLUOROPROP-TETRAFLUOROETH EX AERO: 1.0000 "application " | INHALATION_SPRAY | CUTANEOUS | Status: DC | PRN

## 2022-02-03 MED ORDER — HYDRALAZINE HCL 20 MG/ML IJ SOLN
INTRAMUSCULAR | Status: AC
  Filled 2022-02-03: qty 1

## 2022-02-03 MED ORDER — SODIUM CHLORIDE 0.9 % IV SOLN: INTRAVENOUS | Status: DC

## 2022-02-03 MED ORDER — PHENOL 1.4 % MT LIQD
1.0000 | OROMUCOSAL | Status: DC | PRN
  Administered 2022-02-03: 1 via OROMUCOSAL
  Filled 2022-02-03: qty 177

## 2022-02-03 NOTE — Transfer of Care (Signed)
Immediate Anesthesia Transfer of Care Note ? ?Patient: Cathy [name] ? ?Procedure(s) Performed: TRANSESOPHAGEAL ECHOCARDIOGRAM (TEE) ? ?Patient Location: Endoscopy Unit ? ?Anesthesia Type:MAC ? ?Level of Consciousness: awake, alert  and oriented ? ?Airway & Oxygen Therapy: Patient Spontanous Breathing and Patient connected to nasal cannula oxygen ? ?Post-op Assessment: Report given to RN and Post -op Vital signs reviewed and stable ? ?Post vital signs: Reviewed and stable ? ?Last Vitals:  ?Vitals Value Taken Time  ?BP 149/100 02/03/22 0926  ?Temp 37 ?C 02/03/22 0925  ?Pulse 58 02/03/22 0929  ?Resp 19 02/03/22 0929  ?SpO2 96 % 02/03/22 0929  ?Vitals shown include unvalidated device data. ? ?Last Pain:  ?Vitals:  ? 02/03/22 0925  ?TempSrc: Temporal  ?PainSc: 0-No pain  ?   ? ?Patients Stated Pain Goal: 4 (02/01/22 1927) ? ?Complications: No notable events documented. ?

## 2022-02-03 NOTE — Progress Notes (Signed)
PROGRESS NOTE        PATIENT DETAILS Name: Cathy [name] Age: 41 y.o. Sex: female Date of Birth: [birth year] Admit Date: 01/29/2022 Admitting Physician Christel Mormon, MD PCP:Pcp, No  Brief Summary: Patient is a 41 y.o.  female with history of ESRD, lupus, HTN, HLD-history of chronic under dialysis-numerous recent hospitalizations-presented with fever-found to have MRSA bacteremia.  Significant Hospital events: 1/13-1/17>> RLL pneumonia, large parapneumonic effusion, s/p chest tube and cefepime  1/26-1/30>> RML pneumonia, pericarditis/ effusion (? Lupus vs uremic), and lupus flare 2/5/-2/9>> hypertensive urgency, PE, EF down to 20-25% 3/1>> admit for fever-found to have MRSA bacteremia. 3/1>> tunneled HD catheter removed after dialysis. 3/4>> tunneled HD catheter placed by IR 3/6>> TEE: No vegetations.  Significant imaging studies: 3/1>> CXR: Small bilateral pleural effusions. 3/2>> Echo: No obvious vegetations, EF 35-40%.  Grade 3 diastolic dysfunction.  Trivial pericardial effusion. 3/6>> TEE  Significant microbiology data: 3/1>> COVID/influenza PCR: Positive 3/1>> blood culture: MRSA 3/2>> blood culture: Negative.  Procedures: 3/1>> removal of tunneled dialysis catheter by IR. 3/4>> tunneled dialysis catheter placement by IR. 3/6>> TEE  Consults:  ID, IR, nephrology  Subjective: No chest pain or shortness of breath.  Lying comfortably in bed.  Objective: Vitals: Blood pressure (!) 171/114, pulse (!) 57, temperature 98.6 F (37 C), temperature source Temporal, resp. rate 15, height 5\' 7"  (1.702 m), weight 69.1 kg, SpO2 97 %, unknown if currently breastfeeding.   Exam: Gen Exam:Alert awake-not in any distress HEENT:atraumatic, normocephalic Chest: B/L clear to auscultation anteriorly CVS:S1S2 regular Abdomen:soft non tender, non distended Extremities:no edema Neurology: Non focal Skin: no rash   Pertinent Labs/Radiology: CBC Latest  Ref Rng & Units 02/02/2022 02/01/2022 01/31/2022  WBC 4.0 - 10.5 K/uL 13.3(H) 11.7(H) 13.2(H)  Hemoglobin 12.0 - 15.0 g/dL 8.9(L) 9.2(L) 9.3(L)  Hematocrit 36.0 - 46.0 % 28.7(L) 29.2(L) 29.8(L)  Platelets 150 - 400 K/uL 174 137(L) 136(L)    Lab Results  Component Value Date   NA 134 (L) 02/02/2022   K 3.9 02/02/2022   CL 95 (L) 02/02/2022   CO2 25 02/02/2022      Assessment/Plan: SIRS due to MRSA bacteremia: Clinically improved-remains afebrile-repeat cultures on 3/2 negative so far.  TEE on 3/6 without any vegetations.  TDC was likely the source of bacteremia-removed on 3/1-given line holiday-TDC reinserted by IR on 3/4.  ID following-we will await further recommendations.  ESRD on HD TTS: Nephrology following.  Longstanding history of under dialysis/noncompliance.  Vascular surgery following for permanent access.  Hyperkalemia: Resolved after HD on 3/4.  Normocytic anemia: Due to CKD-defer Aranesp/iron therapy to nephrology.  Chronic combined systolic and diastolic heart failure: Trace lower extremity edema but otherwise stable.  Diuresis with HD.  Will need cardiology input at some point in time when she is a bit more stable-echo findings suspicious for infiltrative cardiomyopathy.    Pericardial effusion: Appears to be a chronic issue-suspect related to poor compliance to dialysis/under dialysis.  Echo on 3/2 showed small pericardial effusion.  Pulmonary embolism: Remains on Eliquis.  HTN: BP elevated-increase Coreg to 25 mg twice daily, continue amlodipine and Coreg.  Follow and adjust.    History of lupus: Appears stable-continue Plaquenil and prednisone  Elevated point-of-care beta-hCG: Likely false positive elevation-Per patient-she has not had her menstrual period for close to a year-and she has not engage in sexual intercourse for "years".  Formal beta hCG not elevated.  Tobacco abuse: Encourage cessation-on transdermal nicotine.  Debility/deconditioning: Some of this is  chronic-likely worsened by MRSA bacteremia.  Mobilize with rehab services and see how she does.  BMI: Estimated body mass index is 23.86 kg/m as calculated from the following:   Height as of this encounter: 5\' 7"  (1.702 m).   Weight as of this encounter: 69.1 kg.   Code status:   Code Status: Full Code   DVT Prophylaxis: apixaban (ELIQUIS) tablet 5 mg    Family Communication: None at bedside  Disposition Plan: Status is: Inpatient Remains inpatient appropriate because: MRSA bacteremia-on IV vancomycin--awaiting recommendations from ID/VVS-probably home in the next 1-2 days.   Planned Discharge Destination:Home   Diet: Diet Order             Diet renal with fluid restriction Fluid restriction: 1200 mL Fluid; Room service appropriate? Yes; Fluid consistency: Thin  Diet effective now                     Antimicrobial agents: Anti-infectives (From admission, onward)    Start     Dose/Rate Route Frequency Ordered Stop   02/01/22 1433  vancomycin (VANCOREADY) IVPB 750 mg/150 mL        750 mg 150 mL/hr over 60 Minutes Intravenous Every Dialysis 02/01/22 1435 02/01/22 1928   02/01/22 0000  ceFAZolin (ANCEF) IVPB 2g/100 mL premix       Note to Pharmacy: Pls contact Webb or Jannifer Franklin if you have question over the weekend.   2 g 200 mL/hr over 30 Minutes Intravenous To Radiology 01/31/22 1555 02/01/22 1607   01/30/22 2200  ceFEPIme (MAXIPIME) 1 g in sodium chloride 0.9 % 100 mL IVPB  Status:  Discontinued        1 g 200 mL/hr over 30 Minutes Intravenous Every 24 hours 01/29/22 0606 01/29/22 1453   01/30/22 1200  vancomycin (VANCOCIN) 750 mg in sodium chloride 0.9 % 250 mL IVPB  Status:  Discontinued        750 mg 250 mL/hr over 60 Minutes Intravenous Every T-Th-Sa (Hemodialysis) 01/29/22 0606 01/30/22 0806   01/30/22 1000  hydroxychloroquine (PLAQUENIL) tablet 100 mg        100 mg Oral Every 48 hours 01/29/22 0904     01/30/22 0900  vancomycin (VANCOREADY) IVPB  750 mg/150 mL        750 mg 150 mL/hr over 60 Minutes Intravenous  Once 01/30/22 0806 01/30/22 1056   01/30/22 0806  vancomycin variable dose per unstable renal function (pharmacist dosing)         Does not apply See admin instructions 01/30/22 0806     01/29/22 0615  ceFEPIme (MAXIPIME) 2 g in sodium chloride 0.9 % 100 mL IVPB        2 g 200 mL/hr over 30 Minutes Intravenous  Once 01/29/22 0600 01/29/22 0722   01/29/22 0615  metroNIDAZOLE (FLAGYL) IVPB 500 mg        500 mg 100 mL/hr over 60 Minutes Intravenous  Once 01/29/22 0600 01/29/22 0903   01/29/22 0615  vancomycin (VANCOCIN) IVPB 1000 mg/200 mL premix  Status:  Discontinued        1,000 mg 200 mL/hr over 60 Minutes Intravenous  Once 01/29/22 0600 01/29/22 0604   01/29/22 0615  Vancomycin (VANCOCIN) 1,250 mg in sodium chloride 0.9 % 250 mL IVPB        1,250 mg 166.7 mL/hr over 90 Minutes Intravenous  Once  01/29/22 0604 01/29/22 0934        MEDICATIONS: Scheduled Meds:  amLODipine  10 mg Oral Daily   apixaban  5 mg Oral BID   calcium acetate  1,334 mg Oral TID WC   carvedilol  12.5 mg Oral BID WC   Chlorhexidine Gluconate Cloth  6 each Topical Q0600   cloNIDine  0.3 mg Oral TID   colchicine  0.3 mg Oral Daily   [START ON 02/04/2022] darbepoetin (ARANESP) injection - DIALYSIS  200 mcg Intravenous Q Tue-HD   feeding supplement (NEPRO CARB STEADY)  237 mL Oral BID BM   hydroxychloroquine  100 mg Oral Q48H   nicotine  14 mg Transdermal Daily   predniSONE  40 mg Oral Q breakfast   sodium chloride flush  3 mL Intravenous Q12H   sodium zirconium cyclosilicate  10 g Oral Daily   vancomycin variable dose per unstable renal function (pharmacist dosing)   Does not apply See admin instructions   Continuous Infusions: PRN Meds:.acetaminophen **OR** acetaminophen, albuterol, calcium carbonate (dosed in mg elemental calcium), docusate sodium, feeding supplement (NEPRO CARB STEADY), HYDROmorphone (DILAUDID) injection, hydrOXYzine,  methocarbamol, ondansetron **OR** ondansetron (ZOFRAN) IV, oxyCODONE, sorbitol, zolpidem   I have personally reviewed following labs and imaging studies  LABORATORY DATA: CBC: Recent Labs  Lab 01/29/22 0246 01/30/22 0432 01/31/22 0202 02/01/22 0100 02/02/22 0203  WBC 15.4* 11.8* 13.2* 11.7* 13.3*  NEUTROABS 14.2*  --   --   --   --   HGB 10.9* 10.0* 9.3* 9.2* 8.9*  HCT 35.1* 32.1* 29.8* 29.2* 28.7*  MCV 93.9 91.7 91.7 91.8 90.8  PLT 176 155 136* 137* 174     Basic Metabolic Panel: Recent Labs  Lab 01/29/22 0246 01/30/22 0432 01/31/22 0202 02/01/22 0100 02/01/22 1121 02/02/22 0203  NA 136 134* 130* 130*  --  134*  K 5.9* 4.7 4.9 5.7* 5.1 3.9  CL 96* 95* 93* 93*  --  95*  CO2 22 25 23  21*  --  25  GLUCOSE 93 89 118* 104*  --  98  BUN 85* 42* 58* 82*  --  42*  CREATININE 9.33* 5.87* 6.91* 7.71*  --  4.87*  CALCIUM 8.6* 7.4* 7.7* 8.1*  --  7.5*  PHOS  --   --  5.1* 4.0  --  3.2     GFR: Estimated Creatinine Clearance: 15.2 mL/min (A) (by C-G formula based on SCr of 4.87 mg/dL (H)).  Liver Function Tests: Recent Labs  Lab 01/29/22 0246 01/31/22 0202 02/01/22 0100 02/02/22 0203  AST 30  --   --   --   ALT 44  --   --   --   ALKPHOS 192*  --   --   --   BILITOT 0.9  --   --   --   PROT 6.2*  --   --   --   ALBUMIN 2.8* 2.5* 2.6* 2.4*    No results for input(s): LIPASE, AMYLASE in the last 168 hours. No results for input(s): AMMONIA in the last 168 hours.  Coagulation Profile: Recent Labs  Lab 01/29/22 0246  INR 2.1*     Cardiac Enzymes: No results for input(s): CKTOTAL, CKMB, CKMBINDEX, TROPONINI in the last 168 hours.  BNP (last 3 results) No results for input(s): PROBNP in the last 8760 hours.  Lipid Profile: No results for input(s): CHOL, HDL, LDLCALC, TRIG, CHOLHDL, LDLDIRECT in the last 72 hours.  Thyroid Function Tests: No results for input(s): TSH, T4TOTAL, FREET4,  T3FREE, THYROIDAB in the last 72 hours.  Anemia Panel: No results  for input(s): VITAMINB12, FOLATE, FERRITIN, TIBC, IRON, RETICCTPCT in the last 72 hours.  Urine analysis:    Component Value Date/Time   COLORURINE YELLOW 11/29/2020 2054   APPEARANCEUR HAZY (A) 11/29/2020 2054   LABSPEC 1.015 11/29/2020 2054   PHURINE 6.0 11/29/2020 2054   GLUCOSEU NEGATIVE 11/29/2020 2054   HGBUR MODERATE (A) 11/29/2020 2054   BILIRUBINUR NEGATIVE 11/29/2020 2054   KETONESUR NEGATIVE 11/29/2020 2054   PROTEINUR >=300 (A) 11/29/2020 2054   UROBILINOGEN 0.2 10/17/2020 1026   NITRITE NEGATIVE 11/29/2020 2054   LEUKOCYTESUR NEGATIVE 11/29/2020 2054    Sepsis Labs: Lactic Acid, Venous    Component Value Date/Time   LATICACIDVEN 3.1 (HH) 01/29/2022 0748    MICROBIOLOGY: Recent Results (from the past 240 hour(s))  Resp Panel by RT-PCR (Flu A&B, Covid) Nasopharyngeal Swab     Status: None   Collection Time: 01/29/22  2:18 AM   Specimen: Nasopharyngeal Swab; Nasopharyngeal(NP) swabs in vial transport medium  Result Value Ref Range Status   SARS Coronavirus 2 by RT PCR NEGATIVE NEGATIVE Final    Comment: (NOTE) SARS-CoV-2 target nucleic acids are NOT DETECTED.  The SARS-CoV-2 RNA is generally detectable in upper respiratory specimens during the acute phase of infection. The lowest concentration of SARS-CoV-2 viral copies this assay can detect is 138 copies/mL. A negative result does not preclude SARS-Cov-2 infection and should not be used as the sole basis for treatment or other patient management decisions. A negative result may occur with  improper specimen collection/handling, submission of specimen other than nasopharyngeal swab, presence of viral mutation(s) within the areas targeted by this assay, and inadequate number of viral copies(<138 copies/mL). A negative result must be combined with clinical observations, patient history, and epidemiological information. The expected result is Negative.  Fact Sheet for Patients:   EntrepreneurPulse.com.au  Fact Sheet for Healthcare Providers:  IncredibleEmployment.be  This test is no t yet approved or cleared by the [place] FDA and  has been authorized for detection and/or diagnosis of SARS-CoV-2 by FDA under an Emergency Use Authorization (EUA). This EUA will remain  in effect (meaning this test can be used) for the duration of the COVID-19 declaration under Section 564(b)(1) of the Act, 21 U.S.C.section 360bbb-3(b)(1), unless the authorization is terminated  or revoked sooner.       Influenza A by PCR NEGATIVE NEGATIVE Final   Influenza B by PCR NEGATIVE NEGATIVE Final    Comment: (NOTE) The Xpert Xpress SARS-CoV-2/FLU/RSV plus assay is intended as an aid in the diagnosis of influenza from Nasopharyngeal swab specimens and should not be used as a sole basis for treatment. Nasal washings and aspirates are unacceptable for Xpert Xpress SARS-CoV-2/FLU/RSV testing.  Fact Sheet for Patients: EntrepreneurPulse.com.au  Fact Sheet for Healthcare Providers: IncredibleEmployment.be  This test is not yet approved or cleared by the [place] FDA and has been authorized for detection and/or diagnosis of SARS-CoV-2 by FDA under an Emergency Use Authorization (EUA). This EUA will remain in effect (meaning this test can be used) for the duration of the COVID-19 declaration under Section 564(b)(1) of the Act, 21 U.S.C. section 360bbb-3(b)(1), unless the authorization is terminated or revoked.  Performed at [facility], [place] [address], [place], [place] [address]   Blood Culture (routine x 2)     Status: Abnormal   Collection Time: 01/29/22  2:30 AM   Specimen: BLOOD LEFT ARM  Result Value Ref Range Status  Specimen Description BLOOD LEFT ARM  Final   Special Requests   Final    BOTTLES DRAWN AEROBIC AND ANAEROBIC Blood Culture results may not be optimal due to an excessive  volume of blood received in culture bottles   Culture  Setup Time   Final    GRAM POSITIVE COCCI IN CLUSTERS IN BOTH AEROBIC AND ANAEROBIC BOTTLES CRITICAL RESULT CALLED TO, READ BACK BY AND VERIFIED WITH: PHARMD M.WEISKOPH AT 2229 ON 01/29/2022 BY T.SAAD. Performed at [facility], [place] [address] [place], [place] [address]    Culture METHICILLIN RESISTANT STAPHYLOCOCCUS AUREUS (A)  Final   Report Status 01/31/2022 FINAL  Final   Organism ID, Bacteria METHICILLIN RESISTANT STAPHYLOCOCCUS AUREUS  Final      Susceptibility   Methicillin resistant staphylococcus aureus - MIC*    CIPROFLOXACIN <=0.5 SENSITIVE Sensitive     ERYTHROMYCIN >=8 RESISTANT Resistant     GENTAMICIN <=0.5 SENSITIVE Sensitive     OXACILLIN >=4 RESISTANT Resistant     TETRACYCLINE <=1 SENSITIVE Sensitive     VANCOMYCIN 1 SENSITIVE Sensitive     TRIMETH/SULFA <=10 SENSITIVE Sensitive     CLINDAMYCIN <=0.25 SENSITIVE Sensitive     RIFAMPIN <=0.5 SENSITIVE Sensitive     Inducible Clindamycin NEGATIVE Sensitive     * METHICILLIN RESISTANT STAPHYLOCOCCUS AUREUS  Blood Culture ID Panel (Reflexed)     Status: Abnormal   Collection Time: 01/29/22  2:30 AM  Result Value Ref Range Status   Enterococcus faecalis NOT DETECTED NOT DETECTED Final   Enterococcus Faecium NOT DETECTED NOT DETECTED Final   Listeria monocytogenes NOT DETECTED NOT DETECTED Final   Staphylococcus species DETECTED (A) NOT DETECTED Final    Comment: CRITICAL RESULT CALLED TO, READ BACK BY AND VERIFIED WITH: PHARMD M.WEISKOPH AT 1449 ON 01/29/2022 BY T.SAAD.    Staphylococcus aureus (BCID) DETECTED (A) NOT DETECTED Final    Comment: Methicillin (oxacillin)-resistant Staphylococcus aureus (MRSA). MRSA is predictably resistant to beta-lactam antibiotics (except ceftaroline). Preferred therapy is vancomycin unless clinically contraindicated. Patient requires contact precautions if  hospitalized. CRITICAL RESULT CALLED TO, READ BACK BY AND VERIFIED  WITH: PHARMD M.WEISKOPH AT 1449 ON 01/29/2022 BY T.SAAD.    Staphylococcus epidermidis NOT DETECTED NOT DETECTED Final   Staphylococcus lugdunensis NOT DETECTED NOT DETECTED Final   Streptococcus species NOT DETECTED NOT DETECTED Final   Streptococcus agalactiae NOT DETECTED NOT DETECTED Final   Streptococcus pneumoniae NOT DETECTED NOT DETECTED Final   Streptococcus pyogenes NOT DETECTED NOT DETECTED Final   A.calcoaceticus-baumannii NOT DETECTED NOT DETECTED Final   Bacteroides fragilis NOT DETECTED NOT DETECTED Final   Enterobacterales NOT DETECTED NOT DETECTED Final   Enterobacter cloacae complex NOT DETECTED NOT DETECTED Final   Escherichia coli NOT DETECTED NOT DETECTED Final   Klebsiella aerogenes NOT DETECTED NOT DETECTED Final   Klebsiella oxytoca NOT DETECTED NOT DETECTED Final   Klebsiella pneumoniae NOT DETECTED NOT DETECTED Final   Proteus species NOT DETECTED NOT DETECTED Final   Salmonella species NOT DETECTED NOT DETECTED Final   Serratia marcescens NOT DETECTED NOT DETECTED Final   Haemophilus influenzae NOT DETECTED NOT DETECTED Final   Neisseria meningitidis NOT DETECTED NOT DETECTED Final   Pseudomonas aeruginosa NOT DETECTED NOT DETECTED Final   Stenotrophomonas maltophilia NOT DETECTED NOT DETECTED Final   Candida albicans NOT DETECTED NOT DETECTED Final   Candida auris NOT DETECTED NOT DETECTED Final   Candida glabrata NOT DETECTED NOT DETECTED Final   Candida krusei NOT DETECTED NOT DETECTED Final  Candida parapsilosis NOT DETECTED NOT DETECTED Final   Candida tropicalis NOT DETECTED NOT DETECTED Final   Cryptococcus neoformans/gattii NOT DETECTED NOT DETECTED Final   Meth resistant mecA/C and MREJ DETECTED (A) NOT DETECTED Final    Comment: CRITICAL RESULT CALLED TO, READ BACK BY AND VERIFIED WITH: PHARMD M.WEISKOPH AT 1449 ON 01/29/2022 BY T.SAAD. Performed at [facility], [place] [address], [place], [place] [address]   Blood Culture (routine x 2)      Status: Abnormal   Collection Time: 01/29/22  2:40 AM   Specimen: BLOOD LEFT HAND  Result Value Ref Range Status   Specimen Description BLOOD LEFT HAND  Final   Special Requests   Final    BOTTLES DRAWN AEROBIC ONLY Blood Culture results may not be optimal due to an excessive volume of blood received in culture bottles   Culture  Setup Time   Final    GRAM POSITIVE COCCI IN CLUSTERS AEROBIC BOTTLE ONLY CRITICAL VALUE NOTED.  VALUE IS CONSISTENT WITH PREVIOUSLY REPORTED AND CALLED VALUE.    Culture (A)  Final    STAPHYLOCOCCUS AUREUS SUSCEPTIBILITIES PERFORMED ON PREVIOUS CULTURE WITHIN THE LAST 5 DAYS. Performed at [facility], [place] [address] [place], [place] [address]    Report Status 01/31/2022 FINAL  Final  Culture, blood (single)     Status: Abnormal   Collection Time: 01/29/22  5:56 AM   Specimen: BLOOD  Result Value Ref Range Status   Specimen Description BLOOD CENTRAL LINE  Final   Special Requests   Final    BOTTLES DRAWN AEROBIC AND ANAEROBIC Blood Culture adequate volume   Culture  Setup Time   Final    GRAM POSITIVE COCCI IN CLUSTERS IN BOTH AEROBIC AND ANAEROBIC BOTTLES CRITICAL VALUE NOTED.  VALUE IS CONSISTENT WITH PREVIOUSLY REPORTED AND CALLED VALUE.    Culture (A)  Final    STAPHYLOCOCCUS AUREUS SUSCEPTIBILITIES PERFORMED ON PREVIOUS CULTURE WITHIN THE LAST 5 DAYS. Performed at [facility], [place] [address], [place], [place] [address]    Report Status 01/31/2022 FINAL  Final  Culture, blood (routine x 2)     Status: None (Preliminary result)   Collection Time: 01/30/22  4:32 AM   Specimen: BLOOD  Result Value Ref Range Status   Specimen Description BLOOD LEFT ANTECUBITAL  Final   Special Requests   Final    BOTTLES DRAWN AEROBIC AND ANAEROBIC Blood Culture adequate volume   Culture   Final    NO GROWTH 4 DAYS Performed at [facility], [place] [address] [place], [place] [address]    Report Status PENDING  Incomplete  Culture, blood  (routine x 2)     Status: None (Preliminary result)   Collection Time: 01/30/22  4:45 AM   Specimen: BLOOD  Result Value Ref Range Status   Specimen Description BLOOD BLOOD RIGHT WRIST  Final   Special Requests   Final    BOTTLES DRAWN AEROBIC AND ANAEROBIC Blood Culture adequate volume   Culture  Setup Time   Final    GRAM POSITIVE COCCI IN CLUSTERS ANAEROBIC BOTTLE ONLY CRITICAL VALUE NOTED.  VALUE IS CONSISTENT WITH PREVIOUSLY REPORTED AND CALLED VALUE.    Culture   Final    NO GROWTH 4 DAYS Performed at [facility], [place] [address], [place], [place] [address]    Report Status PENDING  Incomplete    RADIOLOGY STUDIES/RESULTS: VAS [place] UPPER EXT VEIN MAPPING (PRE-OP AVF)  Result Date:  02/02/2022 UPPER EXTREMITY VEIN MAPPING Patient Name:  STEFAN KAREN  Date of Exam:   02/01/2022 Medical Rec #: [id number]         Accession #:    [id number] Date of Birth: [birth year]        Patient Gender: F Patient Age:   89 years Exam Location:  [facility] Procedure:      VAS [place] UPPER EXT VEIN MAPPING (PRE-OP AVF) Referring Phys: Aldona Bar RHYNE --------------------------------------------------------------------------------  Indications: Pre-access. Comparison Study: No prior studies. Performing Technologist: Oliver Hum RVT  Examination Guidelines: A complete evaluation includes B-mode imaging, spectral Doppler, color Doppler, and power Doppler as needed of all accessible portions of each vessel. Bilateral testing is considered an integral part of a complete examination. Limited examinations for reoccurring indications may be performed as noted. +-----------------+-------------+----------+---------+  Right Cephalic    Diameter (cm) Depth (cm) Findings   +-----------------+-------------+----------+---------+  Shoulder              0.34         0.46               +-----------------+-------------+----------+---------+  Prox upper arm        0.35         0.23                +-----------------+-------------+----------+---------+  Mid upper arm         0.38         0.14               +-----------------+-------------+----------+---------+  Dist upper arm        0.38         0.13               +-----------------+-------------+----------+---------+  Antecubital fossa     0.44         0.15    branching  +-----------------+-------------+----------+---------+  Prox forearm          0.24         0.28    branching  +-----------------+-------------+----------+---------+  Mid forearm           0.06         0.14               +-----------------+-------------+----------+---------+  Dist forearm          0.06         0.12               +-----------------+-------------+----------+---------+ +-----------------+-------------+----------+---------+  Right Basilic     Diameter (cm) Depth (cm) Findings   +-----------------+-------------+----------+---------+  Shoulder              0.42         0.83               +-----------------+-------------+----------+---------+  Mid upper arm         0.45         0.47               +-----------------+-------------+----------+---------+  Dist upper arm        0.46         0.37    branching  +-----------------+-------------+----------+---------+  Antecubital fossa     0.19         0.12               +-----------------+-------------+----------+---------+  Prox forearm          0.25  0.18    branching  +-----------------+-------------+----------+---------+  Mid forearm           0.20         0.09               +-----------------+-------------+----------+---------+  Distal forearm        0.22         0.08               +-----------------+-------------+----------+---------+ +-----------------+-------------+----------+---------+  Left Cephalic     Diameter (cm) Depth (cm) Findings   +-----------------+-------------+----------+---------+  Shoulder              0.35         0.85               +-----------------+-------------+----------+---------+  Prox upper arm         0.36         0.20               +-----------------+-------------+----------+---------+  Mid upper arm         0.32         0.15    branching  +-----------------+-------------+----------+---------+  Dist upper arm        0.32         0.23               +-----------------+-------------+----------+---------+  Antecubital fossa     0.26         0.22    branching  +-----------------+-------------+----------+---------+  Prox forearm          0.24         0.14    branching  +-----------------+-------------+----------+---------+  Mid forearm           0.22         0.21    branching  +-----------------+-------------+----------+---------+  Dist forearm          0.19         0.10               +-----------------+-------------+----------+---------+ +-----------------+-------------+----------+---------+  Left Basilic      Diameter (cm) Depth (cm) Findings   +-----------------+-------------+----------+---------+  Shoulder              0.29         0.53               +-----------------+-------------+----------+---------+  Mid upper arm         0.33         0.51               +-----------------+-------------+----------+---------+  Dist upper arm        0.42         0.37    branching  +-----------------+-------------+----------+---------+  Antecubital fossa     0.32         0.20               +-----------------+-------------+----------+---------+  Prox forearm          0.19         0.15    branching  +-----------------+-------------+----------+---------+  Mid forearm           0.13         0.08               +-----------------+-------------+----------+---------+  Distal forearm        0.10         0.09               +-----------------+-------------+----------+---------+ *  See table(s) above for measurements and observations.  Diagnosing physician: Orlie Pollen Electronically signed by Orlie Pollen on 02/02/2022 at 10:13:07 AM.    Final      LOS: 5 days   Oren Binet, MD  [facility]    To contact the attending  provider between 7A-7P or the covering provider during after hours 7P-7A, please log into the web site [email or web address] and access using universal [place] password for that web site. If you do not have the password, please call the hospital operator.  02/03/2022, 10:58 AM

## 2022-02-03 NOTE — Anesthesia Procedure Notes (Signed)
Procedure Name: [place] ?Date/Time: 02/03/2022 9:00 AM ?Performed by: Griffin Dakin, CRNA ?Pre-anesthesia Checklist: Patient identified, Suction available, Patient being monitored, Timeout performed and Emergency Drugs available ?Patient Re-evaluated:Patient Re-evaluated prior to induction ?Oxygen Delivery Method: Nasal cannula ?Induction Type: IV induction ?Placement Confirmation: breath sounds checked- equal and bilateral and positive ETCO2 ?Dental Injury: Teeth and Oropharynx as per pre-operative assessment  ? ? ? ? ?

## 2022-02-03 NOTE — Anesthesia Preprocedure Evaluation (Addendum)
Anesthesia Evaluation  ?Patient identified by MRN, date of birth, ID band ?Patient awake ? ? ? ?Reviewed: ?Allergy & Precautions, NPO status , Patient's Chart, lab work & pertinent test results ? ?Airway ?Mallampati: II ? ?TM Distance: >3 FB ?Neck ROM: Full ? ? ? Dental ?no notable dental hx. ? ?  ?Pulmonary ?asthma , Current Smoker and Patient abstained from smoking.,  ?  ?Pulmonary exam normal ? ? ? ? ? ? ? Cardiovascular ?hypertension, Pt. on medications ?+CHF  ? ?Rhythm:Regular Rate:Normal ? ? ?  ?Neuro/Psych ?Anxiety Depression Bipolar Disorder negative neurological ROS ?   ? GI/Hepatic ?Neg liver ROS, GERD  ,  ?Endo/Other  ?negative endocrine ROS ? Renal/GU ?CRF and DialysisRenal disease  ?negative genitourinary ?  ?Musculoskeletal ?negative musculoskeletal ROS ?(+)  ? Abdominal ?Normal abdominal exam  (+)   ?Peds ? Hematology ? ?(+) Blood dyscrasia, anemia , bActeremia    ?Anesthesia Other Findings ? ? Reproductive/Obstetrics ? ?  ? ? ? ? ? ? ? ? ? ? ? ? ? ?  ?  ? ? ? ? ? ? ? ?Anesthesia Physical ?Anesthesia Plan ? ?ASA: 3 ? ?Anesthesia Plan: MAC  ? ?Post-op Pain Management:   ? ?Induction: Intravenous ? ?PONV Risk Score and Plan: 1 and Propofol infusion and Treatment may vary due to age or medical condition ? ?Airway Management Planned: Simple Face Mask, Natural Airway and Nasal Cannula ? ?Additional Equipment: None ? ?Intra-op Plan:  ? ?Post-operative Plan:  ? ?Informed Consent: I have reviewed the patients History and Physical, chart, labs and discussed the procedure including the risks, benefits and alternatives for the proposed anesthesia with the patient or authorized representative who has indicated his/her understanding and acceptance.  ? ? ? ?Dental advisory given ? ?Plan Discussed with: CRNA ? ?Anesthesia Plan Comments: (- DOS note: Given hydralazine 62m IV in pre-procedure area given BP 210/110 with history of non compliance and prior admission for malignant  HTN ?Lab Results ?     Component                Value               Date                 ?     WBC                      13.3 (H)            02/02/2022           ?     HGB                      8.9 (L)             02/02/2022           ?     HCT                      28.7 (L)            02/02/2022           ?     MCV                      90.8                02/02/2022           ?  PLT                      174                 02/02/2022           ?Lab Results ?     Component                Value               Date                 ?     NA                       134 (L)             02/02/2022           ?     K                        3.9                 02/02/2022           ?     CO2                      25                  02/02/2022           ?     GLUCOSE                  98                  02/02/2022           ?     BUN                      42 (H)              02/02/2022           ?     CREATININE               4.87 (H)            02/02/2022           ?     CALCIUM                  7.5 (L)             02/02/2022           ?     GFRNONAA                 11 (L)              02/02/2022          )  ? ? ? ? ? ? ?Anesthesia Quick Evaluation ? ?

## 2022-02-03 NOTE — Progress Notes (Addendum)
Pharmacy Antibiotic Note ? ?Cathy [name] is a 41 y.o. female admitted on 01/29/2022 with MRSA bacteremia secondary to HD catheter (removed on 3/1, replaced 3/4).  Pharmacy has been consulted for vancomycin dosing. ? ?D6 Vancomycin ?TEE negative. No growth on repeat blood cultures yet. ? ?WBC 13.3, afebrile ?ESRD on TTS HD - off schedule HD today ?3/6 VR = 18 ? ?Plan: ?Continue Vancomycin 750 mg IV on HD days ?ID planning on 4 weeks duration through 02/27/2022 ?F/U HD schedule, monitor HD tolerance  ? ?Monitor renal function, CBC, cultures/sensitivities, LOT and de-escalate as able ? ?Temp (24hrs), Avg:98.3 ?F (36.8 ?C), Min:97.7 ?F (36.5 ?C), Max:98.6 ?F (37 ?C) ? ?Recent Labs  ?Lab 01/29/22 ?0246 01/29/22 ?5003 01/30/22 ?0432 01/31/22 ?0202 02/01/22 ?0100 02/02/22 ?0203 02/03/22 ?1141  ?WBC 15.4*  --  11.8* 13.2* 11.7* 13.3*  --   ?CREATININE 9.33*  --  5.87* 6.91* 7.71* 4.87*  --   ?LATICACIDVEN 3.0* 3.1*  --   --   --   --   --   ?VANCORANDOM  --   --   --   --   --   --  18  ?  ?Estimated Creatinine Clearance: 15.2 mL/min (A) (by C-G formula based on SCr of 4.87 mg/dL (H)).   ? ?No Known Allergies ? ?Antimicrobials this admission: ? ?Vancomycin 3/1 >> ?Cefepime 3/1 x1 ?Flagyl 3/1 x1 ?Cefazolin 3/4 x1 ? ?Dose adjustments this admission: ?none ? ?Microbiology results: ?3/2 BCx: Ngx4d ?3/1 BCx: MRSA (R: erythromycin) ? ?Thank you for allowing pharmacy to be a part of this patient?s care. ? ?Laurey Arrow, PharmD ?PGY1 Pharmacy Resident ?02/03/2022  12:53 PM ? ?Please check [email or web address] for unit-specific pharmacy phone numbers. ? ?

## 2022-02-03 NOTE — CV Procedure (Signed)
? ? ?  TRANSESOPHAGEAL ECHOCARDIOGRAM  ? ?NAME:  Cathy [name]    ?MRN: [id number] ?DOB:  September 09, 1981    ?ADMIT DATE: 01/29/2022 ? ?INDICATIONS: ?Bacteremia ? ?PROCEDURE:  ? ?Informed consent was obtained prior to the procedure. The risks, benefits and alternatives for the procedure were discussed and the patient comprehended these risks.  Risks include, but are not limited to, cough, sore throat, vomiting, nausea, somnolence, esophageal and stomach trauma or perforation, bleeding, low blood pressure, aspiration, pneumonia, infection, trauma to the teeth and death.   ? ?Procedural time out performed. The oropharynx was anesthetized with viscous lidocaine. ? ?Anesthesia was administered by the anaesthesilogy team to achieve and maintain moderate to deep conscious sedation.  The patient's heart rate, blood pressure, and oxygen saturation were monitored continuously during the procedure. ? ?The transesophageal probe was inserted in the esophagus and stomach without difficulty and multiple views were obtained.  ? ?The patient tolerated the procedure well. ? ?COMPLICATIONS:   ? ?There were no immediate complications. ? ?KEY FINDINGS: ? ?Left ventricular systolic function 35 to 95%, moderate tricuspid regurgitation no evidence of vegetation on preliminary review follow-up full report.  No left atrial appendage clot.  Small left pleural effusion with small ascites. ?Full report to follow. ?Further management per primary team.  ? ?Berniece Salines, DO [facility] ?[place]  [facility]  ?9:27 AM ?  ?

## 2022-02-03 NOTE — Progress Notes (Signed)
[facility] Progress Note   Subjective:    Seen and examined at bedside. S/p TEE earlier today. No complaints and reports feeling better. Denies SOB and CP. Plan for HD tomorrow. Plan for L AVF placement on 02/05/22 by Dr. Carlis Abbott  Objective Vitals:   02/03/22 0934 02/03/22 0951 02/03/22 1213 02/03/22 1605  BP: (!) 161/104 (!) 171/114 128/89 (!) 154/103  Pulse: (!) 58 (!) 57 62 65  Resp: 19 15 13 20   Temp:   97.7 F (36.5 C) 97.6 F (36.4 C)  TempSrc:   Oral Oral  SpO2: 98% 97% 95% 97%  Weight:      Height:       Physical Exam General: Alert young female NAD Heart: RRR, no MRG  Lungs: CTA nonlabored breathing Abdomen: NABS, mild ascites, NT, soft Extremities: No edema BLLE  Vascular Access: S/p R TDC 3/4 after line holiday  Filed Weights   01/29/22 1135 02/01/22 1436 02/01/22 1851  Weight: 63.5 kg 72.6 kg 69.1 kg   No intake or output data in the 24 hours ending 02/03/22 1700  Additional Objective Labs: Basic Metabolic Panel: Recent Labs  Lab 01/31/22 0202 02/01/22 0100 02/01/22 1121 02/02/22 0203  NA 130* 130*  --  134*  K 4.9 5.7* 5.1 3.9  CL 93* 93*  --  95*  CO2 23 21*  --  25  GLUCOSE 118* 104*  --  98  BUN 58* 82*  --  42*  CREATININE 6.91* 7.71*  --  4.87*  CALCIUM 7.7* 8.1*  --  7.5*  PHOS 5.1* 4.0  --  3.2   Liver Function Tests: Recent Labs  Lab 01/29/22 0246 01/31/22 0202 02/01/22 0100 02/02/22 0203  AST 30  --   --   --   ALT 44  --   --   --   ALKPHOS 192*  --   --   --   BILITOT 0.9  --   --   --   PROT 6.2*  --   --   --   ALBUMIN 2.8* 2.5* 2.6* 2.4*   No results for input(s): LIPASE, AMYLASE in the last 168 hours. CBC: Recent Labs  Lab 01/29/22 0246 01/30/22 0432 01/31/22 0202 02/01/22 0100 02/02/22 0203  WBC 15.4* 11.8* 13.2* 11.7* 13.3*  NEUTROABS 14.2*  --   --   --   --   HGB 10.9* 10.0* 9.3* 9.2* 8.9*  HCT 35.1* 32.1* 29.8* 29.2* 28.7*  MCV 93.9 91.7 91.7 91.8 90.8  PLT 176 155 136* 137* 174   Blood  Culture    Component Value Date/Time   SDES BLOOD BLOOD RIGHT WRIST 01/30/2022 0445   SPECREQUEST  01/30/2022 0445    BOTTLES DRAWN AEROBIC AND ANAEROBIC Blood Culture adequate volume   CULT  01/30/2022 0445    NO GROWTH 4 DAYS Performed at [facility], [place] [address] [place], [place] [address]    REPTSTATUS PENDING 01/30/2022 [phone]    Cardiac Enzymes: No results for input(s): CKTOTAL, CKMB, CKMBINDEX, TROPONINI in the last 168 hours. CBG: No results for input(s): GLUCAP in the last 168 hours. Iron Studies: No results for input(s): IRON, TIBC, TRANSFERRIN, FERRITIN in the last 72 hours. Lab Results  Component Value Date   INR 2.1 (H) 01/29/2022   INR 1.2 12/27/2021   INR 1.2 12/13/2021   Studies/Results: ECHO TEE  Result Date: 02/03/2022    TRANSESOPHOGEAL ECHO REPORT   Patient Name:   Simonne Martinet Stavola Date of  Exam: 02/03/2022 Medical Rec #:  [id number]        Height:       67.0 in Accession #:    [id number]       Weight:       152.3 lb Date of Birth:  [birth year]       BSA:          1.801 m Patient Age:    41 years         BP:           177/112 mmHg Patient Gender: F                HR:           65 bpm. Exam Location:  Inpatient Procedure: Transesophageal Echo, 3D Echo, Color Doppler and Cardiac Doppler Indications:     Bacteremia  History:         Patient has prior history of Echocardiogram examinations, most                  recent 01/30/2022. Risk Factors:Hypertension and ESRD, Lupus.  Sonographer:     Raquel Sarna Senior RDCS Referring Phys:  [id number] Darreld Mclean Diagnosing Phys: Godfrey Pick Tobb DO PROCEDURE: After discussion of the risks and benefits of a TEE, an informed consent was obtained from the patient. The transesophogeal probe was passed without difficulty through the esophogus of the patient. Sedation performed by different physician. The patient was monitored while under deep sedation. Anesthestetic sedation was provided intravenously by Anesthesiology: 155mg  of Propofol. The  patient's vital signs; including heart rate, blood pressure, and oxygen saturation; remained stable throughout the procedure. The patient developed no complications during the procedure. IMPRESSIONS  1. Left ventricular ejection fraction, by estimation, is 35 to 40%. The left ventricle has moderately decreased function. The left ventricle demonstrates global hypokinesis.  2. Right ventricular systolic function is normal. The right ventricular size is normal.  3. No left atrial/left atrial appendage thrombus was detected. The LAA emptying velocity was 70 cm/s.  4. Catheter tip seen in the right atrium, no evidence of vegetation.  5. The mitral valve is normal in structure. Mild mitral valve regurgitation. No evidence of mitral stenosis.  6. Tricuspid valve regurgitation is moderate.  7. The aortic valve is normal in structure. Aortic valve regurgitation is mild. No aortic stenosis is present.  8. The inferior vena cava is normal in size with greater than 50% respiratory variability, suggesting right atrial pressure of 3 mmHg. Conclusion(s)/Recommendation(s): No evidence of vegetation/infective endocarditis on this transesophageael echocardiogram. FINDINGS  Left Ventricle: Left ventricular ejection fraction, by estimation, is 35 to 40%. The left ventricle has moderately decreased function. The left ventricle demonstrates global hypokinesis. The left ventricular internal cavity size was normal in size. There is no left ventricular hypertrophy. Right Ventricle: The right ventricular size is normal. No increase in right ventricular wall thickness. Right ventricular systolic function is normal. Left Atrium: Left atrial size was normal in size. No left atrial/left atrial appendage thrombus was detected. The LAA emptying velocity was 70 cm/s. Right Atrium: Right atrial size was normal in size. Catheter tip seen in the right atrium, no evidence of vegetation. Pericardium: There is no evidence of pericardial effusion. Mitral  Valve: The mitral valve is normal in structure. Mild mitral valve regurgitation. No evidence of mitral valve stenosis. Tricuspid Valve: The tricuspid valve is normal in structure. Tricuspid valve regurgitation is moderate . No evidence of tricuspid stenosis. Aortic Valve: The aortic valve is normal  in structure. Aortic valve regurgitation is mild. No aortic stenosis is present. Pulmonic Valve: The pulmonic valve was normal in structure. Pulmonic valve regurgitation is trivial. No evidence of pulmonic stenosis. Aorta: The aortic root is normal in size and structure. Pulmonary Artery: The pulmonary artery is not well seen. The pulmonary artery is of normal size. Venous: The right upper pulmonary vein, left upper pulmonary vein and left lower pulmonary vein are normal. A normal flow pattern is recorded from the left upper pulmonary vein. The inferior vena cava is normal in size with greater than 50% respiratory variability, suggesting right atrial pressure of 3 mmHg. IAS/Shunts: No atrial level shunt detected by color flow Doppler. Additional Comments: There is a small pleural effusion in the left lateral region. Mild ascites is present.  TRICUSPID VALVE TR Peak grad:   22.7 mmHg TR Vmax:        238.00 cm/s Kardie Tobb DO Electronically signed by Berniece Salines DO Signature Date/Time: 02/03/2022/12:28:25 PM    Final     Medications:  [START ON 02/04/2022] vancomycin      amLODipine  10 mg Oral Daily   apixaban  5 mg Oral BID   calcium acetate  1,334 mg Oral TID WC   carvedilol  25 mg Oral BID WC   Chlorhexidine Gluconate Cloth  6 each Topical Q0600   cloNIDine  0.3 mg Oral TID   colchicine  0.3 mg Oral Daily   [START ON 02/04/2022] darbepoetin (ARANESP) injection - DIALYSIS  200 mcg Intravenous Q Tue-HD   feeding supplement (NEPRO CARB STEADY)  237 mL Oral BID BM   hydroxychloroquine  100 mg Oral Q48H   nicotine  14 mg Transdermal Daily   predniSONE  40 mg Oral Q breakfast   sodium chloride flush  3 mL  Intravenous Q12H   sodium zirconium cyclosilicate  10 g Oral Daily    Dialysis Orders: [facility]yzes at [facility] TTS 4 hrs EDW 52 kg 2K/ 2 Ca bath BFR 400 via TDC, DFR 800 Hectorol 3 q rx Venofer 100 q rx x 10, ending 02/08/22 Mircera 225 q 2 weeks, last given 01/25/22  Assessment/Plan: 1 SIRS due to MRSA bacteremia sepsis-on vancomycin, ID following. Patient on line holiday. Repeat BC (-) X 4 days. S/p R TDC 3/4. S/p TEE today. 2 ESRD: TTS HD. Plan for HD 3/7. Patient agrees for arm access now. VVS consulted. Plan for L AVF placement 3/8 by Dr. Carlis Abbott.  3 Hypertension/volume-BP has improved since UF on HD, BP meds now still needed with hypertension. Doesn't appear overloaded today. Push UF with HD. 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 Hold Venofer  with bacteremia  5.  MBD: Continue hectorol and binders, corrected calcium and phosphorus stable 6.  Lupus: on pred and plaquenil 7.  Nutrition: albumin 2 4 add protein supplement to diet  Tobie Poet, NP [place] 02/03/2022,5:00 PM  LOS: 5 days

## 2022-02-03 NOTE — Interval H&P Note (Signed)
History and Physical Interval Note: ? ?02/03/2022 ?8:43 AM ? ?Cathy [name]  has presented today for surgery, with the diagnosis of BACTEREMIA.  The various methods of treatment have been discussed with the patient and family. After consideration of risks, benefits and other options for treatment, the patient has consented to  Procedure(s): ?TRANSESOPHAGEAL ECHOCARDIOGRAM (TEE) (N/A) as a surgical intervention.  The patient's history has been reviewed, patient examined, no change in status, stable for surgery.  I have reviewed the patient's chart and labs.  Questions were answered to the patient's satisfaction.   ? ? ?Godfrey Pick Melizza Kanode ? ? ?

## 2022-02-03 NOTE — Progress Notes (Addendum)
Miscellaneous Pharmacy Consult - Final Antibiotic Plan ? ?Indication: MRSA bacteremia secondary to HD catheter  ?Regimen: Vancomycin 750 mg IV on HD days TTS  ?Total duration: 4 weeks ?Stop date: 03/03/2022 ? ?No OPAT orders will be entered. ? ?Laurey Arrow, PharmD ?PGY1 Pharmacy Resident ?02/03/2022  1:51 PM ? ?Please check [email or web address] for unit-specific pharmacy phone numbers. ? ?

## 2022-02-03 NOTE — Plan of Care (Signed)
  Problem: Health Behavior/Discharge Planning: Goal: Ability to manage health-related needs will improve Outcome: Progressing   Problem: Clinical Measurements: Goal: Ability to maintain clinical measurements within normal limits will improve Outcome: Progressing Goal: Will remain free from infection Outcome: Progressing Goal: Diagnostic test results will improve Outcome: Progressing Goal: Respiratory complications will improve Outcome: Progressing Goal: Cardiovascular complication will be avoided Outcome: Progressing   Problem: Activity: Goal: Risk for activity intolerance will decrease Outcome: Progressing   Problem: Nutrition: Goal: Adequate nutrition will be maintained Outcome: Progressing   Problem: Coping: Goal: Level of anxiety will decrease Outcome: Progressing   Problem: Elimination: Goal: Will not experience complications related to bowel motility Outcome: Progressing Goal: Will not experience complications related to urinary retention Outcome: Progressing   Problem: Pain Managment: Goal: General experience of comfort will improve Outcome: Progressing   

## 2022-02-03 NOTE — Anesthesia Postprocedure Evaluation (Signed)
Anesthesia Post Note ? ?Patient: Cathy [name] ? ?Procedure(s) Performed: TRANSESOPHAGEAL ECHOCARDIOGRAM (TEE) ? ?  ? ?Patient location during evaluation: Endoscopy ?Anesthesia Type: MAC ?Level of consciousness: awake and alert ?Pain management: pain level controlled ?Vital Signs Assessment: post-procedure vital signs reviewed and stable ?Respiratory status: spontaneous breathing, nonlabored ventilation, respiratory function stable and patient connected to nasal cannula oxygen ?Cardiovascular status: stable and blood pressure returned to baseline ?Postop Assessment: no apparent nausea or vomiting ?Anesthetic complications: no ? ? ?No notable events documented. ? ?Last Vitals:  ?Vitals:  ? 02/03/22 0934 02/03/22 0951  ?BP: (!) 161/104 (!) 171/114  ?Pulse: (!) 58 (!) 57  ?Resp: 19 15  ?Temp:    ?SpO2: 98% 97%  ?  ?Last Pain:  ?Vitals:  ? 02/03/22 0951  ?TempSrc:   ?PainSc: 0-No pain  ? ? ?  ?  ?  ?  ?  ?  ? ?Cathy [name] ? ? ? ? ?

## 2022-02-03 NOTE — Progress Notes (Signed)
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[facility])   ESRD on hemodialysis ([facility])   Chronic combined systolic and diastolic congestive heart failure (HCC)   Acute pulmonary embolus (HCC)   Tobacco dependence  Antibiotics:  Cefepime and metronidazole 2/28 Vancomycin 3/1-c    Lines/Hardware: RT chest HD catheter ( removed on 3/1 and new placed 3/4)  Interval Events: Remains afebrile, BBC 13.3   Assessment Complicated MRSA bacteremia/CLABSI secondary to HD catheter (removed on 3/1 and placed on 3/4) -TEE - no vegetations or endocraditis  -Blood cx 3/2 no growth in 4 days   SLE-on Plaquenil and prednisone  ESRD on hemodialysis  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 TTE concerning for possible infiltrative cardiomyopathy - Further work up per PCP and Cardiology. No pericardial effusion in TEE.   History of MSSA bacteremia in July 2022, treated   Recommendations Continue vancomycin, pharmacy to dose while inpatient: to be dosed with HD when discharged  Duration 4 weeks from 3/2. EOT 02/27/22 Monitor CBC, BMP and Vancomycin trough  Will follow in the clinic , appt on 3/31 at 9:45  ID will sign off for now   Rest of the management as per the primary team. Thank you for the consult. Please page with pertinent questions or concerns.  ______________________________________________________________________ Subjective patient seen and examined at the bedside.  Came back from TEE Doing well except some soreness at the Rt HD catheter site   Vitals BP (!) 159/112 (BP Location: Left Arm)     Pulse 68    Temp 98.6 F (37 C) (Oral)    Resp 19    Ht 5\' 7"  (1.702 m)    Wt 69.1 kg    SpO2 99%    BMI 23.86 kg/m     Physical Exam Constitutional: Sitting up in the bed    Comments:   Cardiovascular:     Rate and Rhythm: Normal rate and regular rhythm.     Heart sounds: Rt chest HD catheter - soreness/minimal tenderness, no fluctuance  Pulmonary:     Effort: Pulmonary effort is normal on room air    Comments:   Abdominal:     Palpations: Abdomen is soft.     Tenderness: Nontender and nondistended  Musculoskeletal:        General: No swelling or tenderness of peripheral joints. No back pain   Skin:    Comments:   Neurological:     General: Grossly nonfocal.  Awake alert and oriented  Psychiatric:        Mood and Affect: Mood normal.   Pertinent Microbiology Results for orders placed or performed during the hospital encounter of 01/29/22  Resp Panel by RT-PCR (Flu A&B, Covid) Nasopharyngeal Swab     Status: None   Collection Time: 01/29/22  2:18 AM   Specimen: Nasopharyngeal Swab; Nasopharyngeal(NP) swabs in vial transport medium  Result Value Ref Range Status   SARS Coronavirus 2 by RT PCR NEGATIVE NEGATIVE Final    Comment: (NOTE) SARS-CoV-2 target nucleic acids are NOT DETECTED.  The SARS-CoV-2 RNA is generally detectable in upper respiratory specimens during the acute phase of infection. The lowest concentration of SARS-CoV-2 viral copies this assay can detect is 138  copies/mL. A negative result does not preclude SARS-Cov-2 infection and should not be used as the sole basis for treatment or other patient management decisions. A negative result may occur with  improper specimen collection/handling, submission of specimen other than nasopharyngeal swab, presence of viral mutation(s) within the areas targeted by this assay, and inadequate number of viral copies(<138 copies/mL). A negative result must be combined with clinical observations, patient history, and  epidemiological information. The expected result is Negative.  Fact Sheet for Patients:  EntrepreneurPulse.com.au  Fact Sheet for Healthcare Providers:  IncredibleEmployment.be  This test is no t yet approved or cleared by the [place] FDA and  has been authorized for detection and/or diagnosis of SARS-CoV-2 by FDA under an Emergency Use Authorization (EUA). This EUA will remain  in effect (meaning this test can be used) for the duration of the COVID-19 declaration under Section 564(b)(1) of the Act, 21 U.S.C.section 360bbb-3(b)(1), unless the authorization is terminated  or revoked sooner.       Influenza A by PCR NEGATIVE NEGATIVE Final   Influenza B by PCR NEGATIVE NEGATIVE Final    Comment: (NOTE) The Xpert Xpress SARS-CoV-2/FLU/RSV plus assay is intended as an aid in the diagnosis of influenza from Nasopharyngeal swab specimens and should not be used as a sole basis for treatment. Nasal washings and aspirates are unacceptable for Xpert Xpress SARS-CoV-2/FLU/RSV testing.  Fact Sheet for Patients: EntrepreneurPulse.com.au  Fact Sheet for Healthcare Providers: IncredibleEmployment.be  This test is not yet approved or cleared by the [place] FDA and has been authorized for detection and/or diagnosis of SARS-CoV-2 by FDA under an Emergency Use Authorization (EUA). This EUA will remain in effect (meaning this test can be used) for the duration of the COVID-19 declaration under Section 564(b)(1) of the Act, 21 U.S.C. section 360bbb-3(b)(1), unless the authorization is terminated or revoked.  Performed at [facility], [place] [address], [place], [place] [address]   Blood Culture (routine x 2)     Status: Abnormal   Collection Time: 01/29/22  2:30 AM   Specimen: BLOOD LEFT ARM  Result Value Ref Range Status   Specimen Description BLOOD LEFT ARM  Final   Special Requests   Final     BOTTLES DRAWN AEROBIC AND ANAEROBIC Blood Culture results may not be optimal due to an excessive volume of blood received in culture bottles   Culture  Setup Time   Final    GRAM POSITIVE COCCI IN CLUSTERS IN BOTH AEROBIC AND ANAEROBIC BOTTLES CRITICAL RESULT CALLED TO, READ BACK BY AND VERIFIED WITH: PHARMD M.WEISKOPH AT 0254 ON 01/29/2022 BY T.SAAD. Performed at [facility], [place] [address], [place], [place] [address]    Culture METHICILLIN RESISTANT STAPHYLOCOCCUS AUREUS (A)  Final   Report Status 01/31/2022 FINAL  Final   Organism ID, Bacteria METHICILLIN RESISTANT STAPHYLOCOCCUS AUREUS  Final      Susceptibility   Methicillin resistant staphylococcus aureus - MIC*    CIPROFLOXACIN <=0.5 SENSITIVE Sensitive     ERYTHROMYCIN >=8 RESISTANT Resistant     GENTAMICIN <=0.5 SENSITIVE Sensitive     OXACILLIN >=4 RESISTANT Resistant     TETRACYCLINE <=1 SENSITIVE Sensitive     VANCOMYCIN 1 SENSITIVE Sensitive     TRIMETH/SULFA <=10 SENSITIVE Sensitive     CLINDAMYCIN <=0.25 SENSITIVE Sensitive     RIFAMPIN <=0.5 SENSITIVE Sensitive     Inducible Clindamycin NEGATIVE Sensitive     * METHICILLIN RESISTANT STAPHYLOCOCCUS AUREUS  Blood Culture ID Panel (Reflexed)  Status: Abnormal   Collection Time: 01/29/22  2:30 AM  Result Value Ref Range Status   Enterococcus faecalis NOT DETECTED NOT DETECTED Final   Enterococcus Faecium NOT DETECTED NOT DETECTED Final   Listeria monocytogenes NOT DETECTED NOT DETECTED Final   Staphylococcus species DETECTED (A) NOT DETECTED Final    Comment: CRITICAL RESULT CALLED TO, READ BACK BY AND VERIFIED WITH: PHARMD M.WEISKOPH AT 1449 ON 01/29/2022 BY T.SAAD.    Staphylococcus aureus (BCID) DETECTED (A) NOT DETECTED Final    Comment: Methicillin (oxacillin)-resistant Staphylococcus aureus (MRSA). MRSA is predictably resistant to beta-lactam antibiotics (except ceftaroline). Preferred therapy is vancomycin unless clinically contraindicated. Patient  requires contact precautions if  hospitalized. CRITICAL RESULT CALLED TO, READ BACK BY AND VERIFIED WITH: PHARMD M.WEISKOPH AT 1449 ON 01/29/2022 BY T.SAAD.    Staphylococcus epidermidis NOT DETECTED NOT DETECTED Final   Staphylococcus lugdunensis NOT DETECTED NOT DETECTED Final   Streptococcus species NOT DETECTED NOT DETECTED Final   Streptococcus agalactiae NOT DETECTED NOT DETECTED Final   Streptococcus pneumoniae NOT DETECTED NOT DETECTED Final   Streptococcus pyogenes NOT DETECTED NOT DETECTED Final   A.calcoaceticus-baumannii NOT DETECTED NOT DETECTED Final   Bacteroides fragilis NOT DETECTED NOT DETECTED Final   Enterobacterales NOT DETECTED NOT DETECTED Final   Enterobacter cloacae complex NOT DETECTED NOT DETECTED Final   Escherichia coli NOT DETECTED NOT DETECTED Final   Klebsiella aerogenes NOT DETECTED NOT DETECTED Final   Klebsiella oxytoca NOT DETECTED NOT DETECTED Final   Klebsiella pneumoniae NOT DETECTED NOT DETECTED Final   Proteus species NOT DETECTED NOT DETECTED Final   Salmonella species NOT DETECTED NOT DETECTED Final   Serratia marcescens NOT DETECTED NOT DETECTED Final   Haemophilus influenzae NOT DETECTED NOT DETECTED Final   Neisseria meningitidis NOT DETECTED NOT DETECTED Final   Pseudomonas aeruginosa NOT DETECTED NOT DETECTED Final   Stenotrophomonas maltophilia NOT DETECTED NOT DETECTED Final   Candida albicans NOT DETECTED NOT DETECTED Final   Candida auris NOT DETECTED NOT DETECTED Final   Candida glabrata NOT DETECTED NOT DETECTED Final   Candida krusei NOT DETECTED NOT DETECTED Final   Candida parapsilosis NOT DETECTED NOT DETECTED Final   Candida tropicalis NOT DETECTED NOT DETECTED Final   Cryptococcus neoformans/gattii NOT DETECTED NOT DETECTED Final   Meth resistant mecA/C and MREJ DETECTED (A) NOT DETECTED Final    Comment: CRITICAL RESULT CALLED TO, READ BACK BY AND VERIFIED WITH: PHARMD M.WEISKOPH AT 1449 ON 01/29/2022 BY  T.SAAD. Performed at [facility], [place] [address] [place], [place] [address]   Blood Culture (routine x 2)     Status: Abnormal   Collection Time: 01/29/22  2:40 AM   Specimen: BLOOD LEFT HAND  Result Value Ref Range Status   Specimen Description BLOOD LEFT HAND  Final   Special Requests   Final    BOTTLES DRAWN AEROBIC ONLY Blood Culture results may not be optimal due to an excessive volume of blood received in culture bottles   Culture  Setup Time   Final    GRAM POSITIVE COCCI IN CLUSTERS AEROBIC BOTTLE ONLY CRITICAL VALUE NOTED.  VALUE IS CONSISTENT WITH PREVIOUSLY REPORTED AND CALLED VALUE.    Culture (A)  Final    STAPHYLOCOCCUS AUREUS SUSCEPTIBILITIES PERFORMED ON PREVIOUS CULTURE WITHIN THE LAST 5 DAYS. Performed at [facility], [place] [address] [place], [place] [address]    Report Status 01/31/2022 FINAL  Final  Culture, blood (single)     Status: Abnormal   Collection  Time: 01/29/22  5:56 AM   Specimen: BLOOD  Result Value Ref Range Status   Specimen Description BLOOD CENTRAL LINE  Final   Special Requests   Final    BOTTLES DRAWN AEROBIC AND ANAEROBIC Blood Culture adequate volume   Culture  Setup Time   Final    GRAM POSITIVE COCCI IN CLUSTERS IN BOTH AEROBIC AND ANAEROBIC BOTTLES CRITICAL VALUE NOTED.  VALUE IS CONSISTENT WITH PREVIOUSLY REPORTED AND CALLED VALUE.    Culture (A)  Final    STAPHYLOCOCCUS AUREUS SUSCEPTIBILITIES PERFORMED ON PREVIOUS CULTURE WITHIN THE LAST 5 DAYS. Performed at [facility], [place] [address] [place], [place] [address]    Report Status 01/31/2022 FINAL  Final  Culture, blood (routine x 2)     Status: None (Preliminary result)   Collection Time: 01/30/22  4:32 AM   Specimen: BLOOD  Result Value Ref Range Status   Specimen Description BLOOD LEFT ANTECUBITAL  Final   Special Requests   Final    BOTTLES DRAWN AEROBIC AND ANAEROBIC Blood Culture adequate volume   Culture   Final    NO GROWTH 3 DAYS Performed at  [facility], [place] [address] [place], [place] [address]    Report Status PENDING  Incomplete  Culture, blood (routine x 2)     Status: None (Preliminary result)   Collection Time: 01/30/22  4:45 AM   Specimen: BLOOD  Result Value Ref Range Status   Specimen Description BLOOD BLOOD RIGHT WRIST  Final   Special Requests   Final    BOTTLES DRAWN AEROBIC AND ANAEROBIC Blood Culture adequate volume   Culture  Setup Time   Final    GRAM POSITIVE COCCI IN CLUSTERS ANAEROBIC BOTTLE ONLY CRITICAL VALUE NOTED.  VALUE IS CONSISTENT WITH PREVIOUSLY REPORTED AND CALLED VALUE.    Culture   Final    NO GROWTH 3 DAYS Performed at [facility], [place] [address] [place], [place] [address]    Report Status PENDING  Incomplete    Pertinent Lab. CBC Latest Ref Rng & Units 02/02/2022 02/01/2022 01/31/2022  WBC 4.0 - 10.5 K/uL 13.3(H) 11.7(H) 13.2(H)  Hemoglobin 12.0 - 15.0 g/dL 8.9(L) 9.2(L) 9.3(L)  Hematocrit 36.0 - 46.0 % 28.7(L) 29.2(L) 29.8(L)  Platelets 150 - 400 K/uL 174 137(L) 136(L)   CMP Latest Ref Rng & Units 02/02/2022 02/01/2022 02/01/2022  Glucose 70 - 99 mg/dL 98 - 104(H)  BUN 6 - 20 mg/dL 42(H) - 82(H)  Creatinine 0.44 - 1.00 mg/dL 4.87(H) - 7.71(H)  Sodium 135 - 145 mmol/L 134(L) - 130(L)  Potassium 3.5 - 5.1 mmol/L 3.9 5.1 5.7(H)  Chloride 98 - 111 mmol/L 95(L) - 93(L)  CO2 22 - 32 mmol/L 25 - 21(L)  Calcium 8.9 - 10.3 mg/dL 7.5(L) - 8.1(L)  Total Protein 6.5 - 8.1 g/dL - - -  Total Bilirubin 0.3 - 1.2 mg/dL - - -  Alkaline Phos 38 - 126 U/L - - -  AST 15 - 41 U/L - - -  ALT 0 - 44 U/L - - -     Pertinent Imaging today Plain films and CT images have been personally visualized and interpreted; radiology reports have been reviewed. Decision making incorporated into the Impression / Recommendations.  ECHO TEE  Result Date: 02/03/2022    TRANSESOPHOGEAL ECHO REPORT   Patient Name:   ELVERA ALMARIO Date of Exam: 02/03/2022 Medical Rec #:  [id number]        Height:  67.0 in  Accession #:    [id number]       Weight:       152.3 lb Date of Birth:  [birth year]       BSA:          1.801 m Patient Age:    61 years         BP:           177/112 mmHg Patient Gender: F                HR:           65 bpm. Exam Location:  Inpatient Procedure: Transesophageal Echo, 3D Echo, Color Doppler and Cardiac Doppler Indications:     Bacteremia  History:         Patient has prior history of Echocardiogram examinations, most                  recent 01/30/2022. Risk Factors:Hypertension and ESRD, Lupus.  Sonographer:     Raquel Sarna Senior RDCS Referring Phys:  [id number] Darreld Mclean Diagnosing Phys: Godfrey Pick Tobb DO PROCEDURE: After discussion of the risks and benefits of a TEE, an informed consent was obtained from the patient. The transesophogeal probe was passed without difficulty through the esophogus of the patient. Sedation performed by different physician. The patient was monitored while under deep sedation. Anesthestetic sedation was provided intravenously by Anesthesiology: 155mg  of Propofol. The patient's vital signs; including heart rate, blood pressure, and oxygen saturation; remained stable throughout the procedure. The patient developed no complications during the procedure. IMPRESSIONS  1. Left ventricular ejection fraction, by estimation, is 35 to 40%. The left ventricle has moderately decreased function. The left ventricle demonstrates global hypokinesis.  2. Right ventricular systolic function is normal. The right ventricular size is normal.  3. No left atrial/left atrial appendage thrombus was detected. The LAA emptying velocity was 70 cm/s.  4. Catheter tip seen in the right atrium, no evidence of vegetation.  5. The mitral valve is normal in structure. Mild mitral valve regurgitation. No evidence of mitral stenosis.  6. Tricuspid valve regurgitation is moderate.  7. The aortic valve is normal in structure. Aortic valve regurgitation is mild. No aortic stenosis is present.  8. The inferior vena  cava is normal in size with greater than 50% respiratory variability, suggesting right atrial pressure of 3 mmHg. Conclusion(s)/Recommendation(s): No evidence of vegetation/infective endocarditis on this transesophageael echocardiogram. FINDINGS  Left Ventricle: Left ventricular ejection fraction, by estimation, is 35 to 40%. The left ventricle has moderately decreased function. The left ventricle demonstrates global hypokinesis. The left ventricular internal cavity size was normal in size. There is no left ventricular hypertrophy. Right Ventricle: The right ventricular size is normal. No increase in right ventricular wall thickness. Right ventricular systolic function is normal. Left Atrium: Left atrial size was normal in size. No left atrial/left atrial appendage thrombus was detected. The LAA emptying velocity was 70 cm/s. Right Atrium: Right atrial size was normal in size. Catheter tip seen in the right atrium, no evidence of vegetation. Pericardium: There is no evidence of pericardial effusion. Mitral Valve: The mitral valve is normal in structure. Mild mitral valve regurgitation. No evidence of mitral valve stenosis. Tricuspid Valve: The tricuspid valve is normal in structure. Tricuspid valve regurgitation is moderate . No evidence of tricuspid stenosis. Aortic Valve: The aortic valve is normal in structure. Aortic valve regurgitation is mild. No aortic stenosis is present. Pulmonic Valve: The pulmonic valve was normal in structure.  Pulmonic valve regurgitation is trivial. No evidence of pulmonic stenosis. Aorta: The aortic root is normal in size and structure. Pulmonary Artery: The pulmonary artery is not well seen. The pulmonary artery is of normal size. Venous: The right upper pulmonary vein, left upper pulmonary vein and left lower pulmonary vein are normal. A normal flow pattern is recorded from the left upper pulmonary vein. The inferior vena cava is normal in size with greater than 50% respiratory  variability, suggesting right atrial pressure of 3 mmHg. IAS/Shunts: No atrial level shunt detected by color flow Doppler. Additional Comments: There is a small pleural effusion in the left lateral region. Mild ascites is present.  TRICUSPID VALVE TR Peak grad:   22.7 mmHg TR Vmax:        238.00 cm/s Godfrey Pick Tobb DO Electronically signed by Berniece Salines DO Signature Date/Time: 02/03/2022/12:28:25 PM    Final      I spent more than 35 minutes for this patient encounter including review of prior medical records, coordination of care with primary/other specialist with greater than 50% of time being face to face/counseling and discussing diagnostics/treatment plan with the patient/family.  Electronically signed by:   Rosiland Oz, MD Infectious Disease Physician [facility] Pager: [phone]

## 2022-02-04 ENCOUNTER — Other Ambulatory Visit (HOSPITAL_COMMUNITY): Payer: Self-pay

## 2022-02-04 ENCOUNTER — Encounter (HOSPITAL_COMMUNITY): Payer: Self-pay | Admitting: Cardiology

## 2022-02-04 DIAGNOSIS — Z992 Dependence on renal dialysis: Secondary | ICD-10-CM | POA: Diagnosis not present

## 2022-02-04 DIAGNOSIS — B9562 Methicillin resistant Staphylococcus aureus infection as the cause of diseases classified elsewhere: Secondary | ICD-10-CM | POA: Diagnosis not present

## 2022-02-04 DIAGNOSIS — I5042 Chronic combined systolic (congestive) and diastolic (congestive) heart failure: Secondary | ICD-10-CM | POA: Diagnosis not present

## 2022-02-04 DIAGNOSIS — M3214 Glomerular disease in systemic lupus erythematosus: Secondary | ICD-10-CM | POA: Diagnosis not present

## 2022-02-04 DIAGNOSIS — N186 End stage renal disease: Secondary | ICD-10-CM | POA: Diagnosis not present

## 2022-02-04 DIAGNOSIS — R7881 Bacteremia: Secondary | ICD-10-CM | POA: Diagnosis not present

## 2022-02-04 LAB — CBC WITH DIFFERENTIAL/PLATELET
Abs Immature Granulocytes: 0.1 10*3/uL — ABNORMAL HIGH (ref 0.00–0.07)
Basophils Absolute: 0.1 10*3/uL (ref 0.0–0.1)
Basophils Relative: 1 %
Eosinophils Absolute: 0 10*3/uL (ref 0.0–0.5)
Eosinophils Relative: 0 %
HCT: 29.6 % — ABNORMAL LOW (ref 36.0–46.0)
Hemoglobin: 9.5 g/dL — ABNORMAL LOW (ref 12.0–15.0)
Lymphocytes Relative: 4 %
Lymphs Abs: 0.5 10*3/uL — ABNORMAL LOW (ref 0.7–4.0)
MCH: 29.3 pg (ref 26.0–34.0)
MCHC: 32.1 g/dL (ref 30.0–36.0)
MCV: 91.4 fL (ref 80.0–100.0)
Monocytes Absolute: 0.5 10*3/uL (ref 0.1–1.0)
Monocytes Relative: 4 %
Myelocytes: 1 %
Neutro Abs: 10.7 10*3/uL — ABNORMAL HIGH (ref 1.7–7.7)
Neutrophils Relative %: 90 %
Platelets: 239 10*3/uL (ref 150–400)
RBC: 3.24 MIL/uL — ABNORMAL LOW (ref 3.87–5.11)
RDW: 21.5 % — ABNORMAL HIGH (ref 11.5–15.5)
WBC: 11.9 10*3/uL — ABNORMAL HIGH (ref 4.0–10.5)
nRBC: 0.3 % — ABNORMAL HIGH (ref 0.0–0.2)
nRBC: 1 /100 WBC — ABNORMAL HIGH

## 2022-02-04 LAB — RENAL FUNCTION PANEL
Albumin: 2.6 g/dL — ABNORMAL LOW (ref 3.5–5.0)
Anion gap: 16 — ABNORMAL HIGH (ref 5–15)
BUN: 109 mg/dL — ABNORMAL HIGH (ref 6–20)
CO2: 23 mmol/L (ref 22–32)
Calcium: 8.3 mg/dL — ABNORMAL LOW (ref 8.9–10.3)
Chloride: 95 mmol/L — ABNORMAL LOW (ref 98–111)
Creatinine, Ser: 7.97 mg/dL — ABNORMAL HIGH (ref 0.44–1.00)
GFR, Estimated: 6 mL/min — ABNORMAL LOW (ref >60)
Glucose, Bld: 112 mg/dL — ABNORMAL HIGH (ref 70–99)
Phosphorus: 4.1 mg/dL (ref 2.5–4.6)
Potassium: 4.5 mmol/L (ref 3.5–5.1)
Sodium: 134 mmol/L — ABNORMAL LOW (ref 135–145)

## 2022-02-04 LAB — CULTURE, BLOOD (ROUTINE X 2)
Culture: NO GROWTH
Special Requests: ADEQUATE

## 2022-02-04 MED ORDER — SODIUM CHLORIDE 0.9 % IV SOLN
1.5000 g | INTRAVENOUS | Status: DC
  Filled 2022-02-04: qty 1.5

## 2022-02-04 MED ORDER — HYDRALAZINE HCL 50 MG PO TABS
100.0000 mg | ORAL_TABLET | Freq: Three times a day (TID) | ORAL | Status: DC
Start: 2022-02-04 — End: 2022-02-08
  Administered 2022-02-04 – 2022-02-08 (×13): 100 mg via ORAL
  Filled 2022-02-04 (×13): qty 2

## 2022-02-04 NOTE — Progress Notes (Signed)
[facility] ? ?Subjective  - Agrees to proceed with surgery 02/05/22.  If she can be discharged she wants to and come back for OP surgery. ? ? ?Objective ?(!) 173/108 ?65 ?97.7 ?F (36.5 ?C) (Oral) ?20 ?98% ?No intake or output data in the 24 hours ending 02/04/22 0804 ? ?Left UE M/N/V intact. ?Venous duplex shows acceptable cephalic vein ? ?Assessment/Planning: ?ESRD ?Plan for left UE AV fistula verses graft.  She is right hand dominant.   ? ?She agrees with the plan.  She asked if she is ready to go home can she do the surgery as an out patient?  This can be discussed with Dr. Donzetta Matters and Dr. Sloan Leiter.  ?NPO past MN orders placed. ?She has a working [facility] ? ?Roxy Horseman ?02/04/2022 ?8:04 AM ?-- ? ?Laboratory ?Lab Results: ?Recent Labs  ?  02/02/22 ?0203  ?WBC 13.3*  ?HGB 8.9*  ?HCT 28.7*  ?PLT 174  ? ?BMET ?Recent Labs  ?  02/01/22 ?1121 02/02/22 ?0203  ?NA  --  134*  ?K 5.1 3.9  ?CL  --  95*  ?CO2  --  25  ?GLUCOSE  --  98  ?BUN  --  42*  ?CREATININE  --  4.87*  ?CALCIUM  --  7.5*  ? ? ?COAG ?Lab Results  ?Component Value Date  ? INR 2.1 (H) 01/29/2022  ? INR 1.2 12/27/2021  ? INR 1.2 12/13/2021  ? ?No results found for: PTT ? ? ? ?

## 2022-02-04 NOTE — Progress Notes (Signed)
Request received for perm cath removal. ? ?Patient is s/p perm cath removal due to bacteremia on 3/2, new perm cath placement on 3/4 by Dr. Denna Haggard.  ? ?Spoke with dr. Denna Haggard, there is no indication for perm cath removal at this moment as the new perm cath is new, it has not had time to form a biofilm.  ?Recommends treating bacteremia with the perm cath in place, wishes to reduce unnecessary vessel damage which could hinder IJ perm cath placement in the future.  ? ?Nephrology and ID informed via secure chat.  ?Will delete the IR rad eval order.  ? ?Please call IR for questions and concerns.  ? ?Tera Mater PA-C ?02/04/2022 2:48 PM ? ? ? ? ?

## 2022-02-04 NOTE — Progress Notes (Addendum)
[facility] Progress Note   Subjective:    Seen and examined patient at bedside. C/o generalized pain. Denies SOB and CP. HD today. Plan for L AVF placement 02/05/22.   Objective Vitals:   02/04/22 1130 02/04/22 1137 02/04/22 1200 02/04/22 1230  BP: (!) 154/107 (!) 154/108 (!) 145/96 (!) 146/103  Pulse: 61 60 60 (!) 59  Resp: 16 16 19 15   Temp: 98 F (36.7 C) 98 F (36.7 C)    TempSrc: Oral     SpO2:      Weight:      Height:       Physical Exam General: Alert young female NAD Heart: RRR, no MRG  Lungs: CTA nonlabored breathing Abdomen: NABS, mild ascites, NT, soft Extremities: No edema BLLE  Vascular Access: S/p R TDC 3/4 after line holiday  Filed Weights   01/29/22 1135 02/01/22 1436 02/01/22 1851  Weight: 63.5 kg 72.6 kg 69.1 kg   No intake or output data in the 24 hours ending 02/04/22 1253  Additional Objective Labs: Basic Metabolic Panel: Recent Labs  Lab 02/01/22 0100 02/01/22 1121 02/02/22 0203 02/04/22 1030  NA 130*  --  134* 134*  K 5.7* 5.1 3.9 4.5  CL 93*  --  95* 95*  CO2 21*  --  25 23  GLUCOSE 104*  --  98 112*  BUN 82*  --  42* 109*  CREATININE 7.71*  --  4.87* 7.97*  CALCIUM 8.1*  --  7.5* 8.3*  PHOS 4.0  --  3.2 4.1   Liver Function Tests: Recent Labs  Lab 01/29/22 0246 01/31/22 0202 02/01/22 0100 02/02/22 0203 02/04/22 1030  AST 30  --   --   --   --   ALT 44  --   --   --   --   ALKPHOS 192*  --   --   --   --   BILITOT 0.9  --   --   --   --   PROT 6.2*  --   --   --   --   ALBUMIN 2.8*   < > 2.6* 2.4* 2.6*   < > = values in this interval not displayed.   No results for input(s): LIPASE, AMYLASE in the last 168 hours. CBC: Recent Labs  Lab 01/29/22 0246 01/30/22 0432 01/31/22 0202 02/01/22 0100 02/02/22 0203 02/04/22 1000  WBC 15.4* 11.8* 13.2* 11.7* 13.3* 11.9*  NEUTROABS 14.2*  --   --   --   --  10.7*  HGB 10.9* 10.0* 9.3* 9.2* 8.9* 9.5*  HCT 35.1* 32.1* 29.8* 29.2* 28.7* 29.6*  MCV 93.9 91.7  91.7 91.8 90.8 91.4  PLT 176 155 136* 137* 174 239   Blood Culture    Component Value Date/Time   SDES BLOOD BLOOD RIGHT WRIST 01/30/2022 0445   SPECREQUEST  01/30/2022 0445    BOTTLES DRAWN AEROBIC AND ANAEROBIC Blood Culture adequate volume   CULT (A) 01/30/2022 0445    STAPHYLOCOCCUS AUREUS SUSCEPTIBILITIES PERFORMED ON PREVIOUS CULTURE WITHIN THE LAST 5 DAYS. Performed at [facility], [place] [address], [place], [place] [address]    REPTSTATUS PENDING 01/30/2022 [phone]    Cardiac Enzymes: No results for input(s): CKTOTAL, CKMB, CKMBINDEX, TROPONINI in the last 168 hours. CBG: No results for input(s): GLUCAP in the last 168 hours. Iron Studies: No results for input(s): IRON, TIBC, TRANSFERRIN, FERRITIN in the last 72 hours. Lab Results  Component Value Date   INR 2.1 (H)  01/29/2022   INR 1.2 12/27/2021   INR 1.2 12/13/2021   Studies/Results: ECHO TEE  Result Date: 02/03/2022    TRANSESOPHOGEAL ECHO REPORT   Patient Name:   Cathy [name] Date of Exam: 02/03/2022 Medical Rec #:  [id number]        Height:       67.0 in Accession #:    [id number]       Weight:       152.3 lb Date of Birth:  [birth year]       BSA:          1.801 m Patient Age:    41 years         BP:           177/112 mmHg Patient Gender: F                HR:           65 bpm. Exam Location:  Inpatient Procedure: Transesophageal Echo, 3D Echo, Color Doppler and Cardiac Doppler Indications:     Bacteremia  History:         Patient has prior history of Echocardiogram examinations, most                  recent 01/30/2022. Risk Factors:Hypertension and ESRD, Lupus.  Sonographer:     Raquel Sarna Senior RDCS Referring Phys:  [id number] Darreld Mclean Diagnosing Phys: Godfrey Pick Tobb DO PROCEDURE: After discussion of the risks and benefits of a TEE, an informed consent was obtained from the patient. The transesophogeal probe was passed without difficulty through the esophogus of the patient. Sedation performed by different physician. The  patient was monitored while under deep sedation. Anesthestetic sedation was provided intravenously by Anesthesiology: 155mg  of Propofol. The patient's vital signs; including heart rate, blood pressure, and oxygen saturation; remained stable throughout the procedure. The patient developed no complications during the procedure. IMPRESSIONS  1. Left ventricular ejection fraction, by estimation, is 35 to 40%. The left ventricle has moderately decreased function. The left ventricle demonstrates global hypokinesis.  2. Right ventricular systolic function is normal. The right ventricular size is normal.  3. No left atrial/left atrial appendage thrombus was detected. The LAA emptying velocity was 70 cm/s.  4. Catheter tip seen in the right atrium, no evidence of vegetation.  5. The mitral valve is normal in structure. Mild mitral valve regurgitation. No evidence of mitral stenosis.  6. Tricuspid valve regurgitation is moderate.  7. The aortic valve is normal in structure. Aortic valve regurgitation is mild. No aortic stenosis is present.  8. The inferior vena cava is normal in size with greater than 50% respiratory variability, suggesting right atrial pressure of 3 mmHg. Conclusion(s)/Recommendation(s): No evidence of vegetation/infective endocarditis on this transesophageael echocardiogram. FINDINGS  Left Ventricle: Left ventricular ejection fraction, by estimation, is 35 to 40%. The left ventricle has moderately decreased function. The left ventricle demonstrates global hypokinesis. The left ventricular internal cavity size was normal in size. There is no left ventricular hypertrophy. Right Ventricle: The right ventricular size is normal. No increase in right ventricular wall thickness. Right ventricular systolic function is normal. Left Atrium: Left atrial size was normal in size. No left atrial/left atrial appendage thrombus was detected. The LAA emptying velocity was 70 cm/s. Right Atrium: Right atrial size was normal  in size. Catheter tip seen in the right atrium, no evidence of vegetation. Pericardium: There is no evidence of pericardial effusion. Mitral Valve: The mitral valve is normal in structure.  Mild mitral valve regurgitation. No evidence of mitral valve stenosis. Tricuspid Valve: The tricuspid valve is normal in structure. Tricuspid valve regurgitation is moderate . No evidence of tricuspid stenosis. Aortic Valve: The aortic valve is normal in structure. Aortic valve regurgitation is mild. No aortic stenosis is present. Pulmonic Valve: The pulmonic valve was normal in structure. Pulmonic valve regurgitation is trivial. No evidence of pulmonic stenosis. Aorta: The aortic root is normal in size and structure. Pulmonary Artery: The pulmonary artery is not well seen. The pulmonary artery is of normal size. Venous: The right upper pulmonary vein, left upper pulmonary vein and left lower pulmonary vein are normal. A normal flow pattern is recorded from the left upper pulmonary vein. The inferior vena cava is normal in size with greater than 50% respiratory variability, suggesting right atrial pressure of 3 mmHg. IAS/Shunts: No atrial level shunt detected by color flow Doppler. Additional Comments: There is a small pleural effusion in the left lateral region. Mild ascites is present.  TRICUSPID VALVE TR Peak grad:   22.7 mmHg TR Vmax:        238.00 cm/s Kardie Tobb DO Electronically signed by Berniece Salines DO Signature Date/Time: 02/03/2022/12:28:25 PM    Final     Medications:  sodium chloride     sodium chloride     [START ON 02/05/2022] cefUROXime (ZINACEF)  IV     vancomycin      amLODipine  10 mg Oral Daily   apixaban  5 mg Oral BID   calcium acetate  1,334 mg Oral TID WC   carvedilol  25 mg Oral BID WC   Chlorhexidine Gluconate Cloth  6 each Topical Q0600   cloNIDine  0.3 mg Oral TID   colchicine  0.3 mg Oral Daily   darbepoetin (ARANESP) injection - DIALYSIS  200 mcg Intravenous Q Tue-HD   feeding supplement  (NEPRO CARB STEADY)  237 mL Oral BID BM   hydrALAZINE  100 mg Oral TID   hydroxychloroquine  100 mg Oral Q48H   nicotine  14 mg Transdermal Daily   predniSONE  40 mg Oral Q breakfast   sodium chloride flush  3 mL Intravenous Q12H   sodium zirconium cyclosilicate  10 g Oral Daily    Dialysis Orders: Dialyzes at [facility] TTS 4 hrs EDW 52 kg 2K/ 2 Ca bath BFR 400 via TDC, DFR 800 Hectorol 3 q rx Venofer 100 q rx x 10, ending 02/08/22 Mircera 225 q 2 weeks, last given 01/25/22  Assessment/Plan: 1 SIRS due to MRSA bacteremia sepsis- S/p R TDC 3/4 after line holiday. S/p TEE 3/6. Noted repeat BC from 3/2 (+) staph aureus. IR said that there is no biofilm yet w/ a new TDC so it dose not need to be removed. Appreciate ID and IR assistance.  2 ESRD: TTS HD. Plan for HD 3/7. Patient agrees for arm access now. VVS consulted. Plan for L AVF placement 3/8 by Dr. Carlis Abbott.  3 Hypertension/volume-BP has improved since UF on HD, BP meds now still needed with hypertension. Doesn't appear overloaded today. Push UF with HD. 4. 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 Hold Venofer  with bacteremia  5.  MBD: Continue hectorol and binders, corrected calcium and phosphorus stable 6.  Lupus: on pred and plaquenil 7.  Nutrition: albumin 2 4 add protein supplement to diet   Tobie Poet, NP [place] 02/04/2022,12:53 PM  LOS: 6 days

## 2022-02-04 NOTE — Progress Notes (Signed)
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[place])   ESRD on hemodialysis ([facility])   Chronic combined systolic and diastolic congestive heart failure (HCC)   Acute pulmonary embolus (HCC)   Tobacco dependence  Antibiotics:  Cefepime and metronidazole 2/28 Vancomycin 3/1-c    Lines/Hardware: RT chest HD catheter ( removed on 3/1 and new placed 3/4)  Interval Events: Remains afebrile, WBC 11.9    Assessment Complicated MRSA bacteremia/CLABSI secondary to HD catheter (removed on 3/1 and placed on 3/4) -TEE - no vegetations or endocraditis  -Blood cx 3/2 Staph aureus in 1/4 bottles   SLE-on Plaquenil and prednisone  ESRD on hemodialysis  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 TTE concerning for possible infiltrative cardiomyopathy - Further work up per PCP and Cardiology. No pericardial effusion in TEE.   History of MSSA bacteremia in July 2022, treated   Recommendations Continue vancomycin, pharmacy to dose Repeat 2 sets of blood cultures today for clearance Given her repeat blood cx on 3/2 are positive for staph aureus, HD catheter placed on 3/4 is assumed to be colonized and would need line holiday  Would recommend to have new AVF placement planned for tomorrow to be postponed  Monitor CBC, BMP and Vancomycin trough  Following   Rest of the management as per the primary team. Thank you for the consult. Please page with pertinent questions or concerns.  ______________________________________________________________________ Subjective patient seen and examined at the bedside.   Having  Tearful when told her blood cultures are still positive for staph aureus   Vitals BP (!) 163/107    Pulse 64    Temp 98 F (36.7 C)    Resp 14    Ht 5\' 7"  (1.702 m)    Wt 69.1 kg    SpO2 98%    BMI 23.86 kg/m     Physical Exam Constitutional: Sitting up in the bed    Comments:   Cardiovascular:     Rate and Rhythm: Normal rate and regular rhythm.     Heart sounds: Rt chest HD catheter - non tender   Pulmonary:     Effort: Pulmonary effort is normal on room air    Comments:   Abdominal:     Palpations: Abdomen is soft.     Tenderness: Nontender and nondistended  Musculoskeletal:        General: No swelling or tenderness of peripheral joints. No back pain   Skin:    Comments:   Neurological:     General: Grossly nonfocal.  Awake alert and oriented  Psychiatric:        Mood and Affect: Mood normal.   Pertinent Microbiology Results for orders placed or performed during the hospital encounter of 01/29/22  Resp Panel by RT-PCR (Flu A&B, Covid) Nasopharyngeal Swab     Status: None   Collection Time: 01/29/22  2:18 AM   Specimen: Nasopharyngeal Swab; Nasopharyngeal(NP) swabs in vial transport medium  Result Value Ref Range Status   SARS Coronavirus 2 by RT PCR NEGATIVE NEGATIVE Final    Comment: (NOTE) SARS-CoV-2 target nucleic acids are NOT DETECTED.  The SARS-CoV-2 RNA is generally detectable in upper respiratory specimens during the acute phase of infection. The lowest  concentration of SARS-CoV-2 viral copies this assay can detect is 138 copies/mL. A negative result does not preclude SARS-Cov-2 infection and should not be used as the sole basis for treatment or other patient management decisions. A negative result may occur with  improper specimen collection/handling, submission of specimen other than nasopharyngeal swab, presence of viral mutation(s) within the areas targeted by this assay, and inadequate number of viral copies(<138 copies/mL). A negative  result must be combined with clinical observations, patient history, and epidemiological information. The expected result is Negative.  Fact Sheet for Patients:  EntrepreneurPulse.com.au  Fact Sheet for Healthcare Providers:  IncredibleEmployment.be  This test is no t yet approved or cleared by the [place] FDA and  has been authorized for detection and/or diagnosis of SARS-CoV-2 by FDA under an Emergency Use Authorization (EUA). This EUA will remain  in effect (meaning this test can be used) for the duration of the COVID-19 declaration under Section 564(b)(1) of the Act, 21 U.S.C.section 360bbb-3(b)(1), unless the authorization is terminated  or revoked sooner.       Influenza A by PCR NEGATIVE NEGATIVE Final   Influenza B by PCR NEGATIVE NEGATIVE Final    Comment: (NOTE) The Xpert Xpress SARS-CoV-2/FLU/RSV plus assay is intended as an aid in the diagnosis of influenza from Nasopharyngeal swab specimens and should not be used as a sole basis for treatment. Nasal washings and aspirates are unacceptable for Xpert Xpress SARS-CoV-2/FLU/RSV testing.  Fact Sheet for Patients: EntrepreneurPulse.com.au  Fact Sheet for Healthcare Providers: IncredibleEmployment.be  This test is not yet approved or cleared by the [place] FDA and has been authorized for detection and/or diagnosis of SARS-CoV-2 by FDA under an Emergency Use Authorization (EUA). This EUA will remain in effect (meaning this test can be used) for the duration of the COVID-19 declaration under Section 564(b)(1) of the Act, 21 U.S.C. section 360bbb-3(b)(1), unless the authorization is terminated or revoked.  Performed at [facility], [place] [address] [place], [place] [address]   Blood Culture (routine x 2)     Status: Abnormal   Collection Time: 01/29/22  2:30 AM   Specimen: BLOOD LEFT ARM  Result Value Ref Range Status    Specimen Description BLOOD LEFT ARM  Final   Special Requests   Final    BOTTLES DRAWN AEROBIC AND ANAEROBIC Blood Culture results may not be optimal due to an excessive volume of blood received in culture bottles   Culture  Setup Time   Final    GRAM POSITIVE COCCI IN CLUSTERS IN BOTH AEROBIC AND ANAEROBIC BOTTLES CRITICAL RESULT CALLED TO, READ BACK BY AND VERIFIED WITH: PHARMD M.WEISKOPH AT 0092 ON 01/29/2022 BY T.SAAD. Performed at [facility], [place] [address] [place], [place] [address]    Culture METHICILLIN RESISTANT STAPHYLOCOCCUS AUREUS (A)  Final   Report Status 01/31/2022 FINAL  Final   Organism ID, Bacteria METHICILLIN RESISTANT STAPHYLOCOCCUS AUREUS  Final      Susceptibility   Methicillin resistant staphylococcus aureus - MIC*    CIPROFLOXACIN <=0.5 SENSITIVE Sensitive     ERYTHROMYCIN >=8 RESISTANT Resistant     GENTAMICIN <=0.5 SENSITIVE Sensitive     OXACILLIN >=4 RESISTANT Resistant     TETRACYCLINE <=1 SENSITIVE Sensitive     VANCOMYCIN 1 SENSITIVE Sensitive     TRIMETH/SULFA <=10 SENSITIVE Sensitive     CLINDAMYCIN <=0.25 SENSITIVE Sensitive     RIFAMPIN <=0.5 SENSITIVE Sensitive     Inducible Clindamycin NEGATIVE Sensitive     *  METHICILLIN RESISTANT STAPHYLOCOCCUS AUREUS  Blood Culture ID Panel (Reflexed)     Status: Abnormal   Collection Time: 01/29/22  2:30 AM  Result Value Ref Range Status   Enterococcus faecalis NOT DETECTED NOT DETECTED Final   Enterococcus Faecium NOT DETECTED NOT DETECTED Final   Listeria monocytogenes NOT DETECTED NOT DETECTED Final   Staphylococcus species DETECTED (A) NOT DETECTED Final    Comment: CRITICAL RESULT CALLED TO, READ BACK BY AND VERIFIED WITH: PHARMD M.WEISKOPH AT 1449 ON 01/29/2022 BY T.SAAD.    Staphylococcus aureus (BCID) DETECTED (A) NOT DETECTED Final    Comment: Methicillin (oxacillin)-resistant Staphylococcus aureus (MRSA). MRSA is predictably resistant to beta-lactam antibiotics (except ceftaroline).  Preferred therapy is vancomycin unless clinically contraindicated. Patient requires contact precautions if  hospitalized. CRITICAL RESULT CALLED TO, READ BACK BY AND VERIFIED WITH: PHARMD M.WEISKOPH AT 1449 ON 01/29/2022 BY T.SAAD.    Staphylococcus epidermidis NOT DETECTED NOT DETECTED Final   Staphylococcus lugdunensis NOT DETECTED NOT DETECTED Final   Streptococcus species NOT DETECTED NOT DETECTED Final   Streptococcus agalactiae NOT DETECTED NOT DETECTED Final   Streptococcus pneumoniae NOT DETECTED NOT DETECTED Final   Streptococcus pyogenes NOT DETECTED NOT DETECTED Final   A.calcoaceticus-baumannii NOT DETECTED NOT DETECTED Final   Bacteroides fragilis NOT DETECTED NOT DETECTED Final   Enterobacterales NOT DETECTED NOT DETECTED Final   Enterobacter cloacae complex NOT DETECTED NOT DETECTED Final   Escherichia coli NOT DETECTED NOT DETECTED Final   Klebsiella aerogenes NOT DETECTED NOT DETECTED Final   Klebsiella oxytoca NOT DETECTED NOT DETECTED Final   Klebsiella pneumoniae NOT DETECTED NOT DETECTED Final   Proteus species NOT DETECTED NOT DETECTED Final   Salmonella species NOT DETECTED NOT DETECTED Final   Serratia marcescens NOT DETECTED NOT DETECTED Final   Haemophilus influenzae NOT DETECTED NOT DETECTED Final   Neisseria meningitidis NOT DETECTED NOT DETECTED Final   Pseudomonas aeruginosa NOT DETECTED NOT DETECTED Final   Stenotrophomonas maltophilia NOT DETECTED NOT DETECTED Final   Candida albicans NOT DETECTED NOT DETECTED Final   Candida auris NOT DETECTED NOT DETECTED Final   Candida glabrata NOT DETECTED NOT DETECTED Final   Candida krusei NOT DETECTED NOT DETECTED Final   Candida parapsilosis NOT DETECTED NOT DETECTED Final   Candida tropicalis NOT DETECTED NOT DETECTED Final   Cryptococcus neoformans/gattii NOT DETECTED NOT DETECTED Final   Meth resistant mecA/C and MREJ DETECTED (A) NOT DETECTED Final    Comment: CRITICAL RESULT CALLED TO, READ BACK BY AND  VERIFIED WITH: PHARMD M.WEISKOPH AT 1449 ON 01/29/2022 BY T.SAAD. Performed at [facility], [place] [address], [place], [place] [address]   Blood Culture (routine x 2)     Status: Abnormal   Collection Time: 01/29/22  2:40 AM   Specimen: BLOOD LEFT HAND  Result Value Ref Range Status   Specimen Description BLOOD LEFT HAND  Final   Special Requests   Final    BOTTLES DRAWN AEROBIC ONLY Blood Culture results may not be optimal due to an excessive volume of blood received in culture bottles   Culture  Setup Time   Final    GRAM POSITIVE COCCI IN CLUSTERS AEROBIC BOTTLE ONLY CRITICAL VALUE NOTED.  VALUE IS CONSISTENT WITH PREVIOUSLY REPORTED AND CALLED VALUE.    Culture (A)  Final    STAPHYLOCOCCUS AUREUS SUSCEPTIBILITIES PERFORMED ON PREVIOUS CULTURE WITHIN THE LAST 5 DAYS. Performed at [facility], [place] [address] [place], [place] [address]    Report Status 01/31/2022 FINAL  Final  Culture, blood (single)     Status: Abnormal   Collection Time: 01/29/22  5:56 AM   Specimen: BLOOD  Result Value Ref Range Status   Specimen Description BLOOD CENTRAL LINE  Final   Special Requests   Final    BOTTLES DRAWN AEROBIC AND ANAEROBIC Blood Culture adequate volume   Culture  Setup Time   Final    GRAM POSITIVE COCCI IN CLUSTERS IN BOTH AEROBIC AND ANAEROBIC BOTTLES CRITICAL VALUE NOTED.  VALUE IS CONSISTENT WITH PREVIOUSLY REPORTED AND CALLED VALUE.    Culture (A)  Final    STAPHYLOCOCCUS AUREUS SUSCEPTIBILITIES PERFORMED ON PREVIOUS CULTURE WITHIN THE LAST 5 DAYS. Performed at [facility], [place] [address], [place], [place] [address]    Report Status 01/31/2022 FINAL  Final  Culture, blood (routine x 2)     Status: None   Collection Time: 01/30/22  4:32 AM   Specimen: BLOOD  Result Value Ref Range Status   Specimen Description BLOOD LEFT ANTECUBITAL  Final   Special Requests   Final    BOTTLES DRAWN AEROBIC AND ANAEROBIC Blood Culture adequate volume   Culture    Final    NO GROWTH 5 DAYS Performed at [facility], [place] [address] [place], [place] [address]    Report Status 02/04/2022 FINAL  Final  Culture, blood (routine x 2)     Status: Abnormal (Preliminary result)   Collection Time: 01/30/22  4:45 AM   Specimen: BLOOD  Result Value Ref Range Status   Specimen Description BLOOD BLOOD RIGHT WRIST  Final   Special Requests   Final    BOTTLES DRAWN AEROBIC AND ANAEROBIC Blood Culture adequate volume   Culture  Setup Time   Final    GRAM POSITIVE COCCI IN CLUSTERS ANAEROBIC BOTTLE ONLY CRITICAL VALUE NOTED.  VALUE IS CONSISTENT WITH PREVIOUSLY REPORTED AND CALLED VALUE.    Culture (A)  Final    STAPHYLOCOCCUS AUREUS SUSCEPTIBILITIES PERFORMED ON PREVIOUS CULTURE WITHIN THE LAST 5 DAYS. Performed at [facility], [place] [address] [place], [place] [address]    Report Status PENDING  Incomplete    Pertinent Lab. CBC Latest Ref Rng & Units 02/04/2022 02/02/2022 02/01/2022  WBC 4.0 - 10.5 K/uL 11.9(H) 13.3(H) 11.7(H)  Hemoglobin 12.0 - 15.0 g/dL 9.5(L) 8.9(L) 9.2(L)  Hematocrit 36.0 - 46.0 % 29.6(L) 28.7(L) 29.2(L)  Platelets 150 - 400 K/uL 239 174 137(L)   CMP Latest Ref Rng & Units 02/04/2022 02/02/2022 02/01/2022  Glucose 70 - 99 mg/dL 112(H) 98 -  BUN 6 - 20 mg/dL 109(H) 42(H) -  Creatinine 0.44 - 1.00 mg/dL 7.97(H) 4.87(H) -  Sodium 135 - 145 mmol/L 134(L) 134(L) -  Potassium 3.5 - 5.1 mmol/L 4.5 3.9 5.1  Chloride 98 - 111 mmol/L 95(L) 95(L) -  CO2 22 - 32 mmol/L 23 25 -  Calcium 8.9 - 10.3 mg/dL 8.3(L) 7.5(L) -  Total Protein 6.5 - 8.1 g/dL - - -  Total Bilirubin 0.3 - 1.2 mg/dL - - -  Alkaline Phos 38 - 126 U/L - - -  AST 15 - 41 U/L - - -  ALT 0 - 44 U/L - - -     Pertinent Imaging today Plain films and CT images have been personally visualized and interpreted; radiology reports have been reviewed. Decision making incorporated into the Impression / Recommendations.  I spent more than 35 minutes for this patient encounter  including review of prior medical records, coordination of  care with primary/other specialist with greater than 50% of time being face to face/counseling and discussing diagnostics/treatment plan with the patient/family.  Electronically signed by:   Rosiland Oz, MD Infectious Disease Physician [facility] Pager: [phone]

## 2022-02-04 NOTE — Progress Notes (Addendum)
PROGRESS NOTE        PATIENT DETAILS Name: Cathy [name] Age: 41 y.o. Sex: female Date of Birth: December 01, 1981 Admit Date: 01/29/2022 Admitting Physician Christel Mormon, MD PCP:Pcp, No  Brief Summary: Patient is a 41 y.o.  female with history of ESRD, lupus, HTN, HLD-history of chronic under dialysis-numerous recent hospitalizations-presented with fever-found to have MRSA bacteremia.  Significant Hospital events: 1/13-1/17>> RLL pneumonia, large parapneumonic effusion, s/p chest tube and cefepime  1/26-1/30>> RML pneumonia, pericarditis/ effusion (? Lupus vs uremic), and lupus flare 2/5/-2/9>> hypertensive urgency, PE, EF down to 20-25% 3/1>> admit for fever-found to have MRSA bacteremia. 3/1>> tunneled HD catheter removed after dialysis. 3/4>> tunneled HD catheter placed by IR 3/6>> TEE: No vegetations.  Significant imaging studies: 3/1>> CXR: Small bilateral pleural effusions. 3/2>> Echo: No obvious vegetations, EF 35-40%.  Grade 3 diastolic dysfunction.  Trivial pericardial effusion. 3/6>> TEE  Significant microbiology data: 3/1>> COVID/influenza PCR: Positive 3/1>> blood culture: MRSA 3/2>> blood culture: 1/2 positive for Staph aureus  Procedures: 3/1>> removal of tunneled dialysis catheter by IR. 3/4>> tunneled dialysis catheter placement by IR. 3/6>> TEE  Consults:  ID, IR, nephrology  Subjective: Initially wanted to go home today and come back tomorrow for AV fistula placement-however she changed her mind and now wants to go home after she gets the AV fistula placed tomorrow.  Lying comfortably in bed-denies any chest pain or shortness of breath.  Objective: Vitals: Blood pressure (!) 163/107, pulse 64, temperature 98 F (36.7 C), resp. rate 14, height 5\' 7"  (1.702 m), weight 69.1 kg, SpO2 98 %, unknown if currently breastfeeding.   Exam: Gen Exam:Alert awake-not in any distress HEENT:atraumatic, normocephalic Chest: B/L clear to  auscultation anteriorly CVS:S1S2 regular Abdomen:soft non tender, non distended Extremities:no edema Neurology: Non focal Skin: no rash   Pertinent Labs/Radiology: CBC Latest Ref Rng & Units 02/04/2022 02/02/2022 02/01/2022  WBC 4.0 - 10.5 K/uL 11.9(H) 13.3(H) 11.7(H)  Hemoglobin 12.0 - 15.0 g/dL 9.5(L) 8.9(L) 9.2(L)  Hematocrit 36.0 - 46.0 % 29.6(L) 28.7(L) 29.2(L)  Platelets 150 - 400 K/uL 239 174 137(L)    Lab Results  Component Value Date   NA 134 (L) 02/04/2022   K 4.5 02/04/2022   CL 95 (L) 02/04/2022   CO2 23 02/04/2022      Assessment/Plan: SIRS due to MRSA bacteremia: Clinically improved-remains afebrile-repeat cultures on 3/2 negative so far.  TEE on 3/6 without any vegetations.  TDC was likely the source of bacteremia-removed on 3/1-given line holiday-TDC reinserted by IR on 3/4.  ID following-recommendations are for IV vancomycin with HD till 3/30.    Addendum: Repeat blood culture on 3/2-1 out of 2 sets came back positive for Staph aureus.  Nephrology aware-with tentative plans to remove HD catheter post dialysis today and give her another few days of line holiday.  We will reach out to vascular surgery to see if we can postpone AV fistula placement that is scheduled on 3/8.  ESRD on HD TTS: Nephrology following.  Longstanding history of under dialysis/noncompliance.  Vascular surgery planning on AV fistula placement on 3/8.  Hyperkalemia: Resolved after HD on 3/4.  Normocytic anemia: Due to CKD-defer Aranesp/iron therapy to nephrology.  Chronic combined systolic and diastolic heart failure: Trace lower extremity edema but otherwise stable.  Diuresis with HD.  Given echocardiogram findings concerning for infiltrative cardiomyopathy-outpatient cardiology appointment has  been scheduled for 3/16.  Pericardial effusion: Appears to be a chronic issue-suspect related to poor compliance to dialysis/under dialysis.  Echo on 3/2 showed small pericardial effusion.  Pulmonary  embolism: Remains on Eliquis.  HTN: BP elevated-continue Coreg, amlodipine, clonidine-restart hydralazine.   History of lupus: Appears stable-continue Plaquenil and prednisone  Elevated point-of-care beta-hCG: Likely false positive elevation-Per patient-she has not had her menstrual period for close to a year-and she has not engage in sexual intercourse for "years".  Formal beta hCG not elevated.  Tobacco abuse: Encourage cessation-on transdermal nicotine.  Debility/deconditioning: Some of this is chronic-likely worsened by MRSA bacteremia.  Mobilize with rehab services and see how she does.  BMI: Estimated body mass index is 23.86 kg/m as calculated from the following:   Height as of this encounter: 5\' 7"  (1.702 m).   Weight as of this encounter: 69.1 kg.   Code status:   Code Status: Full Code   DVT Prophylaxis: apixaban (ELIQUIS) tablet 5 mg    Family Communication: None at bedside  Disposition Plan: Status is: Inpatient Remains inpatient appropriate because: MRSA bacteremia-on IV vancomycin--awaiting recommendations from ID/VVS-probably home in the next 1-2 days.   Planned Discharge Destination:Home   Diet: Diet Order             Diet NPO time specified Except for: Sips with Meds  Diet effective midnight           Diet renal with fluid restriction Fluid restriction: 1200 mL Fluid; Room service appropriate? Yes; Fluid consistency: Thin  Diet effective now                     Antimicrobial agents: Anti-infectives (From admission, onward)    Start     Dose/Rate Route Frequency Ordered Stop   02/05/22 0600  cefUROXime (ZINACEF) 1.5 g in sodium chloride 0.9 % 100 mL IVPB        1.5 g 200 mL/hr over 30 Minutes Intravenous On call to O.R. 02/04/22 0809 02/06/22 0559   02/04/22 1200  vancomycin (VANCOREADY) IVPB 750 mg/150 mL        750 mg 150 mL/hr over 60 Minutes Intravenous Every T-Th-Sa (Hemodialysis) 02/03/22 1348 03/01/22 1159   02/01/22 1433  vancomycin  (VANCOREADY) IVPB 750 mg/150 mL        750 mg 150 mL/hr over 60 Minutes Intravenous Every Dialysis 02/01/22 1435 02/01/22 1928   02/01/22 0000  ceFAZolin (ANCEF) IVPB 2g/100 mL premix       Note to Pharmacy: Pls contact [place] or Jannifer Franklin if you have question over the weekend.   2 g 200 mL/hr over 30 Minutes Intravenous To Radiology 01/31/22 1555 02/01/22 1607   01/30/22 2200  ceFEPIme (MAXIPIME) 1 g in sodium chloride 0.9 % 100 mL IVPB  Status:  Discontinued        1 g 200 mL/hr over 30 Minutes Intravenous Every 24 hours 01/29/22 0606 01/29/22 1453   01/30/22 1200  vancomycin (VANCOCIN) 750 mg in sodium chloride 0.9 % 250 mL IVPB  Status:  Discontinued        750 mg 250 mL/hr over 60 Minutes Intravenous Every T-Th-Sa (Hemodialysis) 01/29/22 0606 01/30/22 0806   01/30/22 1000  hydroxychloroquine (PLAQUENIL) tablet 100 mg        100 mg Oral Every 48 hours 01/29/22 0904     01/30/22 0900  vancomycin (VANCOREADY) IVPB 750 mg/150 mL        750 mg 150 mL/hr over 60 Minutes Intravenous  Once 01/30/22 0806 01/30/22 1056   01/30/22 0806  vancomycin variable dose per unstable renal function (pharmacist dosing)  Status:  Discontinued         Does not apply See admin instructions 01/30/22 0806 02/03/22 1348   01/29/22 0615  ceFEPIme (MAXIPIME) 2 g in sodium chloride 0.9 % 100 mL IVPB        2 g 200 mL/hr over 30 Minutes Intravenous  Once 01/29/22 0600 01/29/22 0722   01/29/22 0615  metroNIDAZOLE (FLAGYL) IVPB 500 mg        500 mg 100 mL/hr over 60 Minutes Intravenous  Once 01/29/22 0600 01/29/22 0903   01/29/22 0615  vancomycin (VANCOCIN) IVPB 1000 mg/200 mL premix  Status:  Discontinued        1,000 mg 200 mL/hr over 60 Minutes Intravenous  Once 01/29/22 0600 01/29/22 0604   01/29/22 0615  Vancomycin (VANCOCIN) 1,250 mg in sodium chloride 0.9 % 250 mL IVPB        1,250 mg 166.7 mL/hr over 90 Minutes Intravenous  Once 01/29/22 0604 01/29/22 0934        MEDICATIONS: Scheduled  Meds:  amLODipine  10 mg Oral Daily   apixaban  5 mg Oral BID   calcium acetate  1,334 mg Oral TID WC   carvedilol  25 mg Oral BID WC   Chlorhexidine Gluconate Cloth  6 each Topical Q0600   cloNIDine  0.3 mg Oral TID   colchicine  0.3 mg Oral Daily   darbepoetin (ARANESP) injection - DIALYSIS  200 mcg Intravenous Q Tue-HD   feeding supplement (NEPRO CARB STEADY)  237 mL Oral BID BM   hydrALAZINE  100 mg Oral TID   hydroxychloroquine  100 mg Oral Q48H   nicotine  14 mg Transdermal Daily   predniSONE  40 mg Oral Q breakfast   sodium chloride flush  3 mL Intravenous Q12H   sodium zirconium cyclosilicate  10 g Oral Daily   Continuous Infusions:  sodium chloride     sodium chloride     [START ON 02/05/2022] cefUROXime (ZINACEF)  IV     vancomycin     PRN Meds:.sodium chloride, sodium chloride, acetaminophen **OR** acetaminophen, albuterol, alteplase, calcium carbonate (dosed in mg elemental calcium), docusate sodium, feeding supplement (NEPRO CARB STEADY), heparin, HYDROmorphone (DILAUDID) injection, hydrOXYzine, lidocaine (PF), lidocaine-prilocaine, methocarbamol, ondansetron **OR** ondansetron (ZOFRAN) IV, oxyCODONE, pentafluoroprop-tetrafluoroeth, phenol, sorbitol, zolpidem   I have personally reviewed following labs and imaging studies  LABORATORY DATA: CBC: Recent Labs  Lab 01/29/22 0246 01/30/22 0432 01/31/22 0202 02/01/22 0100 02/02/22 0203 02/04/22 1000  WBC 15.4* 11.8* 13.2* 11.7* 13.3* 11.9*  NEUTROABS 14.2*  --   --   --   --  10.7*  HGB 10.9* 10.0* 9.3* 9.2* 8.9* 9.5*  HCT 35.1* 32.1* 29.8* 29.2* 28.7* 29.6*  MCV 93.9 91.7 91.7 91.8 90.8 91.4  PLT 176 155 136* 137* 174 239     Basic Metabolic Panel: Recent Labs  Lab 01/30/22 0432 01/31/22 0202 02/01/22 0100 02/01/22 1121 02/02/22 0203 02/04/22 1030  NA 134* 130* 130*  --  134* 134*  K 4.7 4.9 5.7* 5.1 3.9 4.5  CL 95* 93* 93*  --  95* 95*  CO2 25 23 21*  --  25 23  GLUCOSE 89 118* 104*  --  98 112*   BUN 42* 58* 82*  --  42* 109*  CREATININE 5.87* 6.91* 7.71*  --  4.87* 7.97*  CALCIUM 7.4* 7.7* 8.1*  --  7.5* 8.3*  PHOS  --  5.1* 4.0  --  3.2 4.1     GFR: Estimated Creatinine Clearance: 9.3 mL/min (A) (by C-G formula based on SCr of 7.97 mg/dL (H)).  Liver Function Tests: Recent Labs  Lab 01/29/22 0246 01/31/22 0202 02/01/22 0100 02/02/22 0203 02/04/22 1030  AST 30  --   --   --   --   ALT 44  --   --   --   --   ALKPHOS 192*  --   --   --   --   BILITOT 0.9  --   --   --   --   PROT 6.2*  --   --   --   --   ALBUMIN 2.8* 2.5* 2.6* 2.4* 2.6*    No results for input(s): LIPASE, AMYLASE in the last 168 hours. No results for input(s): AMMONIA in the last 168 hours.  Coagulation Profile: Recent Labs  Lab 01/29/22 0246  INR 2.1*     Cardiac Enzymes: No results for input(s): CKTOTAL, CKMB, CKMBINDEX, TROPONINI in the last 168 hours.  BNP (last 3 results) No results for input(s): PROBNP in the last 8760 hours.  Lipid Profile: No results for input(s): CHOL, HDL, LDLCALC, TRIG, CHOLHDL, LDLDIRECT in the last 72 hours.  Thyroid Function Tests: No results for input(s): TSH, T4TOTAL, FREET4, T3FREE, THYROIDAB in the last 72 hours.  Anemia Panel: No results for input(s): VITAMINB12, FOLATE, FERRITIN, TIBC, IRON, RETICCTPCT in the last 72 hours.  Urine analysis:    Component Value Date/Time   COLORURINE YELLOW 11/29/2020 2054   APPEARANCEUR HAZY (A) 11/29/2020 2054   LABSPEC 1.015 11/29/2020 2054   PHURINE 6.0 11/29/2020 2054   GLUCOSEU NEGATIVE 11/29/2020 2054   HGBUR MODERATE (A) 11/29/2020 2054   BILIRUBINUR NEGATIVE 11/29/2020 2054   KETONESUR NEGATIVE 11/29/2020 2054   PROTEINUR >=300 (A) 11/29/2020 2054   UROBILINOGEN 0.2 10/17/2020 1026   NITRITE NEGATIVE 11/29/2020 2054   LEUKOCYTESUR NEGATIVE 11/29/2020 2054    Sepsis Labs: Lactic Acid, Venous    Component Value Date/Time   LATICACIDVEN 3.1 (HH) 01/29/2022 0748    MICROBIOLOGY: Recent  Results (from the past 240 hour(s))  Resp Panel by RT-PCR (Flu A&B, Covid) Nasopharyngeal Swab     Status: None   Collection Time: 01/29/22  2:18 AM   Specimen: Nasopharyngeal Swab; Nasopharyngeal(NP) swabs in vial transport medium  Result Value Ref Range Status   SARS Coronavirus 2 by RT PCR NEGATIVE NEGATIVE Final    Comment: (NOTE) SARS-CoV-2 target nucleic acids are NOT DETECTED.  The SARS-CoV-2 RNA is generally detectable in upper respiratory specimens during the acute phase of infection. The lowest concentration of SARS-CoV-2 viral copies this assay can detect is 138 copies/mL. A negative result does not preclude SARS-Cov-2 infection and should not be used as the sole basis for treatment or other patient management decisions. A negative result may occur with  improper specimen collection/handling, submission of specimen other than nasopharyngeal swab, presence of viral mutation(s) within the areas targeted by this assay, and inadequate number of viral copies(<138 copies/mL). A negative result must be combined with clinical observations, patient history, and epidemiological information. The expected result is Negative.  Fact Sheet for Patients:  EntrepreneurPulse.com.au  Fact Sheet for Healthcare Providers:  IncredibleEmployment.be  This test is no t yet approved or cleared by the [place] FDA and  has been authorized for detection and/or diagnosis of SARS-CoV-2 by FDA under an Emergency Use Authorization (EUA). This EUA will remain  in effect (meaning this test can be used) for the duration of the COVID-19 declaration under Section 564(b)(1) of the Act, 21 U.S.C.section 360bbb-3(b)(1), unless the authorization is terminated  or revoked sooner.       Influenza A by PCR NEGATIVE NEGATIVE Final   Influenza B by PCR NEGATIVE NEGATIVE Final    Comment: (NOTE) The Xpert Xpress SARS-CoV-2/FLU/RSV plus assay is intended as an aid in the  diagnosis of influenza from Nasopharyngeal swab specimens and should not be used as a sole basis for treatment. Nasal washings and aspirates are unacceptable for Xpert Xpress SARS-CoV-2/FLU/RSV testing.  Fact Sheet for Patients: EntrepreneurPulse.com.au  Fact Sheet for Healthcare Providers: IncredibleEmployment.be  This test is not yet approved or cleared by the [place] FDA and has been authorized for detection and/or diagnosis of SARS-CoV-2 by FDA under an Emergency Use Authorization (EUA). This EUA will remain in effect (meaning this test can be used) for the duration of the COVID-19 declaration under Section 564(b)(1) of the Act, 21 U.S.C. section 360bbb-3(b)(1), unless the authorization is terminated or revoked.  Performed at [facility], [place] [address], [place], [place] [address]   Blood Culture (routine x 2)     Status: Abnormal   Collection Time: 01/29/22  2:30 AM   Specimen: BLOOD LEFT ARM  Result Value Ref Range Status   Specimen Description BLOOD LEFT ARM  Final   Special Requests   Final    BOTTLES DRAWN AEROBIC AND ANAEROBIC Blood Culture results may not be optimal due to an excessive volume of blood received in culture bottles   Culture  Setup Time   Final    GRAM POSITIVE COCCI IN CLUSTERS IN BOTH AEROBIC AND ANAEROBIC BOTTLES CRITICAL RESULT CALLED TO, READ BACK BY AND VERIFIED WITH: PHARMD M.WEISKOPH AT 4166 ON 01/29/2022 BY T.SAAD. Performed at [facility], [place] [address], [place], [place] [address]    Culture METHICILLIN RESISTANT STAPHYLOCOCCUS AUREUS (A)  Final   Report Status 01/31/2022 FINAL  Final   Organism ID, Bacteria METHICILLIN RESISTANT STAPHYLOCOCCUS AUREUS  Final      Susceptibility   Methicillin resistant staphylococcus aureus - MIC*    CIPROFLOXACIN <=0.5 SENSITIVE Sensitive     ERYTHROMYCIN >=8 RESISTANT Resistant     GENTAMICIN <=0.5 SENSITIVE Sensitive     OXACILLIN >=4 RESISTANT  Resistant     TETRACYCLINE <=1 SENSITIVE Sensitive     VANCOMYCIN 1 SENSITIVE Sensitive     TRIMETH/SULFA <=10 SENSITIVE Sensitive     CLINDAMYCIN <=0.25 SENSITIVE Sensitive     RIFAMPIN <=0.5 SENSITIVE Sensitive     Inducible Clindamycin NEGATIVE Sensitive     * METHICILLIN RESISTANT STAPHYLOCOCCUS AUREUS  Blood Culture ID Panel (Reflexed)     Status: Abnormal   Collection Time: 01/29/22  2:30 AM  Result Value Ref Range Status   Enterococcus faecalis NOT DETECTED NOT DETECTED Final   Enterococcus Faecium NOT DETECTED NOT DETECTED Final   Listeria monocytogenes NOT DETECTED NOT DETECTED Final   Staphylococcus species DETECTED (A) NOT DETECTED Final    Comment: CRITICAL RESULT CALLED TO, READ BACK BY AND VERIFIED WITH: PHARMD M.WEISKOPH AT 1449 ON 01/29/2022 BY T.SAAD.    Staphylococcus aureus (BCID) DETECTED (A) NOT DETECTED Final    Comment: Methicillin (oxacillin)-resistant Staphylococcus aureus (MRSA). MRSA is predictably resistant to beta-lactam antibiotics (except ceftaroline). Preferred therapy is vancomycin unless clinically contraindicated. Patient requires contact precautions if  hospitalized. CRITICAL RESULT CALLED TO, READ BACK BY AND VERIFIED WITH: PHARMD M.WEISKOPH AT  1449 ON 01/29/2022 BY T.SAAD.    Staphylococcus epidermidis NOT DETECTED NOT DETECTED Final   Staphylococcus lugdunensis NOT DETECTED NOT DETECTED Final   Streptococcus species NOT DETECTED NOT DETECTED Final   Streptococcus agalactiae NOT DETECTED NOT DETECTED Final   Streptococcus pneumoniae NOT DETECTED NOT DETECTED Final   Streptococcus pyogenes NOT DETECTED NOT DETECTED Final   A.calcoaceticus-baumannii NOT DETECTED NOT DETECTED Final   Bacteroides fragilis NOT DETECTED NOT DETECTED Final   Enterobacterales NOT DETECTED NOT DETECTED Final   Enterobacter cloacae complex NOT DETECTED NOT DETECTED Final   Escherichia coli NOT DETECTED NOT DETECTED Final   Klebsiella aerogenes NOT DETECTED NOT DETECTED  Final   Klebsiella oxytoca NOT DETECTED NOT DETECTED Final   Klebsiella pneumoniae NOT DETECTED NOT DETECTED Final   Proteus species NOT DETECTED NOT DETECTED Final   Salmonella species NOT DETECTED NOT DETECTED Final   Serratia marcescens NOT DETECTED NOT DETECTED Final   Haemophilus influenzae NOT DETECTED NOT DETECTED Final   Neisseria meningitidis NOT DETECTED NOT DETECTED Final   Pseudomonas aeruginosa NOT DETECTED NOT DETECTED Final   Stenotrophomonas maltophilia NOT DETECTED NOT DETECTED Final   Candida albicans NOT DETECTED NOT DETECTED Final   Candida auris NOT DETECTED NOT DETECTED Final   Candida glabrata NOT DETECTED NOT DETECTED Final   Candida krusei NOT DETECTED NOT DETECTED Final   Candida parapsilosis NOT DETECTED NOT DETECTED Final   Candida tropicalis NOT DETECTED NOT DETECTED Final   Cryptococcus neoformans/gattii NOT DETECTED NOT DETECTED Final   Meth resistant mecA/C and MREJ DETECTED (A) NOT DETECTED Final    Comment: CRITICAL RESULT CALLED TO, READ BACK BY AND VERIFIED WITH: PHARMD M.WEISKOPH AT 1449 ON 01/29/2022 BY T.SAAD. Performed at [facility], [place] [address], [place], [place] [address]   Blood Culture (routine x 2)     Status: Abnormal   Collection Time: 01/29/22  2:40 AM   Specimen: BLOOD LEFT HAND  Result Value Ref Range Status   Specimen Description BLOOD LEFT HAND  Final   Special Requests   Final    BOTTLES DRAWN AEROBIC ONLY Blood Culture results may not be optimal due to an excessive volume of blood received in culture bottles   Culture  Setup Time   Final    GRAM POSITIVE COCCI IN CLUSTERS AEROBIC BOTTLE ONLY CRITICAL VALUE NOTED.  VALUE IS CONSISTENT WITH PREVIOUSLY REPORTED AND CALLED VALUE.    Culture (A)  Final    STAPHYLOCOCCUS AUREUS SUSCEPTIBILITIES PERFORMED ON PREVIOUS CULTURE WITHIN THE LAST 5 DAYS. Performed at [facility], [place] [address], [place], [place] [address]    Report Status 01/31/2022 FINAL  Final   Culture, blood (single)     Status: Abnormal   Collection Time: 01/29/22  5:56 AM   Specimen: BLOOD  Result Value Ref Range Status   Specimen Description BLOOD CENTRAL LINE  Final   Special Requests   Final    BOTTLES DRAWN AEROBIC AND ANAEROBIC Blood Culture adequate volume   Culture  Setup Time   Final    GRAM POSITIVE COCCI IN CLUSTERS IN BOTH AEROBIC AND ANAEROBIC BOTTLES CRITICAL VALUE NOTED.  VALUE IS CONSISTENT WITH PREVIOUSLY REPORTED AND CALLED VALUE.    Culture (A)  Final    STAPHYLOCOCCUS AUREUS SUSCEPTIBILITIES PERFORMED ON PREVIOUS CULTURE WITHIN THE LAST 5 DAYS. Performed at [facility], [place] [address] [place], [place] [address]    Report Status 01/31/2022 FINAL  Final  Culture, blood (routine x 2)  Status: None   Collection Time: 01/30/22  4:32 AM   Specimen: BLOOD  Result Value Ref Range Status   Specimen Description BLOOD LEFT ANTECUBITAL  Final   Special Requests   Final    BOTTLES DRAWN AEROBIC AND ANAEROBIC Blood Culture adequate volume   Culture   Final    NO GROWTH 5 DAYS Performed at [facility], [address] [place], [place] [address]    Report Status 02/04/2022 FINAL  Final  Culture, blood (routine x 2)     Status: Abnormal (Preliminary result)   Collection Time: 01/30/22  4:45 AM   Specimen: BLOOD  Result Value Ref Range Status   Specimen Description BLOOD BLOOD RIGHT WRIST  Final   Special Requests   Final    BOTTLES DRAWN AEROBIC AND ANAEROBIC Blood Culture adequate volume   Culture  Setup Time   Final    GRAM POSITIVE COCCI IN CLUSTERS ANAEROBIC BOTTLE ONLY CRITICAL VALUE NOTED.  VALUE IS CONSISTENT WITH PREVIOUSLY REPORTED AND CALLED VALUE.    Culture (A)  Final    STAPHYLOCOCCUS AUREUS SUSCEPTIBILITIES PERFORMED ON PREVIOUS CULTURE WITHIN THE LAST 5 DAYS. Performed at [facility], [place] [address], [place], [place] [address]    Report Status PENDING  Incomplete    RADIOLOGY STUDIES/RESULTS: ECHO  TEE  Result Date: 02/03/2022    TRANSESOPHOGEAL ECHO REPORT   Patient Name:   VIVIENE THURSTON Date of Exam: 02/03/2022 Medical Rec #:  [id number]        Height:       67.0 in Accession #:    [id number]       Weight:       152.3 lb Date of Birth:  05/26/1981       BSA:          1.801 m Patient Age:    53 years         BP:           177/112 mmHg Patient Gender: F                HR:           65 bpm. Exam Location:  Inpatient Procedure: Transesophageal Echo, 3D Echo, Color Doppler and Cardiac Doppler Indications:     Bacteremia  History:         Patient has prior history of Echocardiogram examinations, most                  recent 01/30/2022. Risk Factors:Hypertension and ESRD, Lupus.  Sonographer:     Raquel Sarna Senior RDCS Referring Phys:  [id number] Darreld Mclean Diagnosing Phys: Godfrey Pick Tobb DO PROCEDURE: After discussion of the risks and benefits of a TEE, an informed consent was obtained from the patient. The transesophogeal probe was passed without difficulty through the esophogus of the patient. Sedation performed by different physician. The patient was monitored while under deep sedation. Anesthestetic sedation was provided intravenously by Anesthesiology: 155mg  of Propofol. The patient's vital signs; including heart rate, blood pressure, and oxygen saturation; remained stable throughout the procedure. The patient developed no complications during the procedure. IMPRESSIONS  1. Left ventricular ejection fraction, by estimation, is 35 to 40%. The left ventricle has moderately decreased function. The left ventricle demonstrates global hypokinesis.  2. Right ventricular systolic function is normal. The right ventricular size is normal.  3. No left atrial/left atrial appendage thrombus was detected. The LAA emptying velocity was 70 cm/s.  4. Catheter tip seen  in the right atrium, no evidence of vegetation.  5. The mitral valve is normal in structure. Mild mitral valve regurgitation. No evidence of mitral stenosis.  6.  Tricuspid valve regurgitation is moderate.  7. The aortic valve is normal in structure. Aortic valve regurgitation is mild. No aortic stenosis is present.  8. The inferior vena cava is normal in size with greater than 50% respiratory variability, suggesting right atrial pressure of 3 mmHg. Conclusion(s)/Recommendation(s): No evidence of vegetation/infective endocarditis on this transesophageael echocardiogram. FINDINGS  Left Ventricle: Left ventricular ejection fraction, by estimation, is 35 to 40%. The left ventricle has moderately decreased function. The left ventricle demonstrates global hypokinesis. The left ventricular internal cavity size was normal in size. There is no left ventricular hypertrophy. Right Ventricle: The right ventricular size is normal. No increase in right ventricular wall thickness. Right ventricular systolic function is normal. Left Atrium: Left atrial size was normal in size. No left atrial/left atrial appendage thrombus was detected. The LAA emptying velocity was 70 cm/s. Right Atrium: Right atrial size was normal in size. Catheter tip seen in the right atrium, no evidence of vegetation. Pericardium: There is no evidence of pericardial effusion. Mitral Valve: The mitral valve is normal in structure. Mild mitral valve regurgitation. No evidence of mitral valve stenosis. Tricuspid Valve: The tricuspid valve is normal in structure. Tricuspid valve regurgitation is moderate . No evidence of tricuspid stenosis. Aortic Valve: The aortic valve is normal in structure. Aortic valve regurgitation is mild. No aortic stenosis is present. Pulmonic Valve: The pulmonic valve was normal in structure. Pulmonic valve regurgitation is trivial. No evidence of pulmonic stenosis. Aorta: The aortic root is normal in size and structure. Pulmonary Artery: The pulmonary artery is not well seen. The pulmonary artery is of normal size. Venous: The right upper pulmonary vein, left upper pulmonary vein and left lower  pulmonary vein are normal. A normal flow pattern is recorded from the left upper pulmonary vein. The inferior vena cava is normal in size with greater than 50% respiratory variability, suggesting right atrial pressure of 3 mmHg. IAS/Shunts: No atrial level shunt detected by color flow Doppler. Additional Comments: There is a small pleural effusion in the left lateral region. Mild ascites is present.  TRICUSPID VALVE TR Peak grad:   22.7 mmHg TR Vmax:        238.00 cm/s Godfrey Pick Tobb DO Electronically signed by Berniece Salines DO Signature Date/Time: 02/03/2022/12:28:25 PM    Final      LOS: 6 days   Oren Binet, MD  [facility]    To contact the attending provider between 7A-7P or the covering provider during after hours 7P-7A, please log into the web site [email or web address] and access using universal [place] password for that web site. If you do not have the password, please call the hospital operator.  02/04/2022, 1:35 PM

## 2022-02-04 NOTE — Consult Note (Signed)
[facility] CM Inpatient Consult ? ? ?02/04/2022 ? ?Cathy [name] ?[birth year] ?[id number] ? ?[facility] CM) ?  ?Patient chart reviewed with noted high risk score for unplanned readmission and unplanned 30 day readmission. Assessed for post hospital chronic care management needs with [facility] CM services. Attempted to speak with patient bedside, but patient in treatment area at this time. Per review, no current PCP listed. Will attempt to follow up with patient at a later time to verify PCP. ? ?Of note, [facility] Care Management services does not replace or interfere with any services that are arranged by inpatient case management or social work.   ? ?Netta Cedars, MSN, RN ?[facility] Liaison ?Mobile Phone [phone]  ?Toll free office [phone]  ? ?

## 2022-02-04 NOTE — Progress Notes (Signed)
Hemodialysis- Patient refusing HD this am until eats breakfast. Unable to wait due to large patient load. Patient is aware and still refuses. Will attempt back at a later time. ?

## 2022-02-05 ENCOUNTER — Encounter (HOSPITAL_COMMUNITY)
Admission: EM | Disposition: A | Discharge status: Home / Self Care | Payer: Self-pay | Source: Home / Self Care | Attending: Internal Medicine

## 2022-02-05 LAB — CULTURE, BLOOD (ROUTINE X 2): Special Requests: ADEQUATE

## 2022-02-05 SURGERY — ARTERIOVENOUS (AV) FISTULA CREATION
Anesthesia: Choice | Laterality: Left

## 2022-02-05 MED ORDER — PENTAFLUOROPROP-TETRAFLUOROETH EX AERO: 1.0000 "application " | INHALATION_SPRAY | CUTANEOUS | Status: DC | PRN

## 2022-02-05 MED ORDER — HEPARIN SODIUM (PORCINE) 1000 UNIT/ML DIALYSIS: 1000.0000 [IU] | INTRAMUSCULAR | Status: DC | PRN

## 2022-02-05 MED ORDER — SODIUM CHLORIDE 0.9 % IV SOLN: 100.0000 mL | INTRAVENOUS | Status: DC | PRN

## 2022-02-05 MED ORDER — ALTEPLASE 2 MG IJ SOLR: 2.0000 mg | Freq: Once | INTRAMUSCULAR | Status: DC | PRN

## 2022-02-05 MED ORDER — LIDOCAINE-PRILOCAINE 2.5-2.5 % EX CREA
1.0000 "application " | TOPICAL_CREAM | CUTANEOUS | Status: DC | PRN
  Filled 2022-02-05: qty 5

## 2022-02-05 MED ORDER — LIDOCAINE HCL (PF) 1 % IJ SOLN: 5.0000 mL | INTRAMUSCULAR | Status: DC | PRN

## 2022-02-05 NOTE — Progress Notes (Signed)
Pt receives out-pt HD at [facility] on [facility]TS. Pt arrives at 7:45 for 8:00 chair time. Will assist as needed.  ? ?Melven Sartorius ?Renal Navigator ?[phone] ?

## 2022-02-05 NOTE — Progress Notes (Signed)
[facility] ? ?Subjective  - No fever chills this morning. Blood cultures pending ? ? ?Objective ?(!) 183/116 ?61 ?98.6 ?F (37 ?C) (Oral) ?14 ?100% ? ?Intake/Output Summary (Last 24 hours) at 02/05/2022 0830 ?Last data filed at 02/05/2022 0300 ?Gross per 24 hour  ?Intake 104.89 ml  ?Output 3500 ml  ?Net -3395.11 ml  ? ? ?Left UE M/N/V intact. ?Venous duplex shows acceptable cephalic vein ? ?Assessment/Planning: ?ESRD ?Plan for left UE AV fistula verses graft.  Cathy [name] is right hand dominant.   ? ?We will cancel AVF creation today at infectious disease's request. Please call our service line when pt has clinically improved. In talking with nephrology, Cathy [name] would be best treated with AVF creation prior to discharge. Happy to coordinate.  ? ?Cathy [name] ?02/05/2022 ?8:30 AM ?-- ? ?Laboratory ?Lab Results: ?Recent Labs  ?  02/04/22 ?1000  ?WBC 11.9*  ?HGB 9.5*  ?HCT 29.6*  ?PLT 239  ? ? ?BMET ?Recent Labs  ?  02/04/22 ?1030  ?NA 134*  ?K 4.5  ?CL 95*  ?CO2 23  ?GLUCOSE 112*  ?BUN 109*  ?CREATININE 7.97*  ?CALCIUM 8.3*  ? ? ? ?COAG ?Lab Results  ?Component Value Date  ? INR 2.1 (H) 01/29/2022  ? INR 1.2 12/27/2021  ? INR 1.2 12/13/2021  ? ?No results found for: PTT ? ? ? ?

## 2022-02-05 NOTE — Progress Notes (Addendum)
PROGRESS NOTE        PATIENT DETAILS Name: Cathy [name] Age: 41 y.o. Sex: female Date of Birth: 27-Dec-1980 Admit Date: 01/29/2022 Admitting Physician Christel Mormon, MD PCP:Pcp, No  Brief Summary: Patient is a 40 y.o.  female with history of ESRD, lupus, HTN, HLD-history of chronic under dialysis-numerous recent hospitalizations-presented with fever-found to have MRSA bacteremia.  TEE was negative for vegetations.  TDC was removed on 3/1-she was given a line holiday-TDC was subsequently replaced on 3/4.    Significant Hospital events: 1/13-1/17>> RLL pneumonia, large parapneumonic effusion, s/p chest tube and cefepime  1/26-1/30>> RML pneumonia, pericarditis/ effusion (? Lupus vs uremic), and lupus flare 2/5/-2/9>> hypertensive urgency, PE, EF down to 20-25% 3/1>> admit for fever-found to have MRSA bacteremia. 3/1>> tunneled HD catheter removed after dialysis. 3/4>> tunneled HD catheter placed by IR 3/6>> TEE: No vegetations. 3/7>> 1 out of 2 blood cultures drawn on 3/2 positive for MRSA.  Significant imaging studies: 3/1>> CXR: Small bilateral pleural effusions. 3/2>> Echo: No obvious vegetations, EF 35-40%.  Grade 3 diastolic dysfunction.  Trivial pericardial effusion. 3/6>> TEE  Significant microbiology data: 3/1>> COVID/influenza PCR: Positive 3/1>> blood culture: MRSA 3/2>> blood culture: 1/2 +ve MRSA 3/7>>blood culture: neg so far  Procedures: 3/1>> removal of tunneled dialysis catheter by IR. 3/4>> tunneled dialysis catheter placement by IR. 3/6>> TEE  Consults:  ID, IR, nephrology  Subjective: Awake/alert-no chest pain or shortness of breath.  Inquiring about discharge plans.  Objective: Vitals: Blood pressure 138/90, pulse 63, temperature 98.6 F (37 C), temperature source Oral, resp. rate 17, height 5\' 7"  (1.702 m), weight 69.1 kg, SpO2 100 %, unknown if currently breastfeeding.   Exam: Gen Exam:Alert awake-not in any  distress HEENT:atraumatic, normocephalic Chest: B/L clear to auscultation anteriorly CVS:S1S2 regular Abdomen:soft non tender, non distended Extremities:no edema Neurology: Non focal Skin: no rash   Pertinent Labs/Radiology: CBC Latest Ref Rng & Units 02/04/2022 02/02/2022 02/01/2022  WBC 4.0 - 10.5 K/uL 11.9(H) 13.3(H) 11.7(H)  Hemoglobin 12.0 - 15.0 g/dL 9.5(L) 8.9(L) 9.2(L)  Hematocrit 36.0 - 46.0 % 29.6(L) 28.7(L) 29.2(L)  Platelets 150 - 400 K/uL 239 174 137(L)    Lab Results  Component Value Date   NA 134 (L) 02/04/2022   K 4.5 02/04/2022   CL 95 (L) 02/04/2022   CO2 23 02/04/2022      Assessment/Plan: SIRS due to MRSA bacteremia: Overall clinically-foci of infection felt to be [facility] catheter which was removed on 3/1.  Repeat blood cultures on 3/2 were negative-line holiday was given until 3/4-when IR replaced TDC.  Unfortunately 1 out of 2 sets of blood cultures done on 3/2 grew out MRSA again (after 4 days on 3/7).  As discussed with IR on 3/7-existing TDC does not need to be removed, repeat blood cultures on 3/7 pending.  If repeat cultures drawn on 3/7 come back positive for MRSA-May need to reconsider removing existing TDC.  ID following-we will await further recommendations.  ESRD on HD TTS: Nephrology following.  Longstanding history of under dialysis/noncompliance.  Vascular surgery was planning on AV fistula placement on 3/8-but given positive blood cultures-this has now been postponed.    Hyperkalemia: Resolved after HD on 3/4.  Normocytic anemia: Due to CKD-defer Aranesp/iron therapy to nephrology.  Chronic combined systolic and diastolic heart failure: Trace lower extremity edema but otherwise stable.  Diuresis with  HD.  Given echocardiogram findings concerning for infiltrative cardiomyopathy-outpatient cardiology appointment has been scheduled for 3/16.  Pericardial effusion: Appears to be a chronic issue-suspect related to poor compliance to dialysis/under dialysis.   Echo on 3/2 showed small pericardial effusion.  Pulmonary embolism: Remains on Eliquis.  HTN: BP elevated-continue Coreg, amlodipine, clonidine-restart hydralazine.   History of lupus: Appears stable-continue Plaquenil and prednisone  Elevated point-of-care beta-hCG: Likely false positive elevation-Per patient-she has not had her menstrual period for close to a year-and she has not engage in sexual intercourse for "years".  Formal beta hCG not elevated.  Tobacco abuse: Encourage cessation-on transdermal nicotine.  Debility/deconditioning: Some of this is chronic-likely worsened by MRSA bacteremia.  Mobilize with rehab services and see how she does.  BMI: Estimated body mass index is 23.86 kg/m as calculated from the following:   Height as of this encounter: 5\' 7"  (1.702 m).   Weight as of this encounter: 69.1 kg.   Code status:   Code Status: Full Code   DVT Prophylaxis: apixaban (ELIQUIS) tablet 5 mg    Family Communication: None at bedside  Disposition Plan: Status is: Inpatient Remains inpatient appropriate because: MRSA bacteremia-on IV vancomycin-unfortunately repeat cultures drawn on 3/2 were positive after 4 days on 3/7.  Repeat cultures again drawn on 3/7-will need to stay inpatient to ensure clearance of bacteremia before consideration of discharge.   Planned Discharge Destination:Home   Diet: Diet Order             Diet renal with fluid restriction Fluid restriction: 1200 mL Fluid; Room service appropriate? Yes; Fluid consistency: Thin  Diet effective now                     Antimicrobial agents: Anti-infectives (From admission, onward)    Start     Dose/Rate Route Frequency Ordered Stop   02/05/22 0600  cefUROXime (ZINACEF) 1.5 g in sodium chloride 0.9 % 100 mL IVPB  Status:  Discontinued        1.5 g 200 mL/hr over 30 Minutes Intravenous On call to O.R. 02/04/22 0809 02/05/22 1332   02/04/22 1200  vancomycin (VANCOREADY) IVPB 750 mg/150 mL         750 mg 150 mL/hr over 60 Minutes Intravenous Every T-Th-Sa (Hemodialysis) 02/03/22 1348 03/01/22 1159   02/01/22 1433  vancomycin (VANCOREADY) IVPB 750 mg/150 mL        750 mg 150 mL/hr over 60 Minutes Intravenous Every Dialysis 02/01/22 1435 02/01/22 1928   02/01/22 0000  ceFAZolin (ANCEF) IVPB 2g/100 mL premix       Note to Pharmacy: Pls contact [place] or Jannifer Franklin if you have question over the weekend.   2 g 200 mL/hr over 30 Minutes Intravenous To Radiology 01/31/22 1555 02/01/22 1607   01/30/22 2200  ceFEPIme (MAXIPIME) 1 g in sodium chloride 0.9 % 100 mL IVPB  Status:  Discontinued        1 g 200 mL/hr over 30 Minutes Intravenous Every 24 hours 01/29/22 0606 01/29/22 1453   01/30/22 1200  vancomycin (VANCOCIN) 750 mg in sodium chloride 0.9 % 250 mL IVPB  Status:  Discontinued        750 mg 250 mL/hr over 60 Minutes Intravenous Every T-Th-Sa (Hemodialysis) 01/29/22 0606 01/30/22 0806   01/30/22 1000  hydroxychloroquine (PLAQUENIL) tablet 100 mg        100 mg Oral Every 48 hours 01/29/22 0904     01/30/22 0900  vancomycin (VANCOREADY) IVPB 750 mg/150  mL        750 mg 150 mL/hr over 60 Minutes Intravenous  Once 01/30/22 0806 01/30/22 1056   01/30/22 0806  vancomycin variable dose per unstable renal function (pharmacist dosing)  Status:  Discontinued         Does not apply See admin instructions 01/30/22 0806 02/03/22 1348   01/29/22 0615  ceFEPIme (MAXIPIME) 2 g in sodium chloride 0.9 % 100 mL IVPB        2 g 200 mL/hr over 30 Minutes Intravenous  Once 01/29/22 0600 01/29/22 0722   01/29/22 0615  metroNIDAZOLE (FLAGYL) IVPB 500 mg        500 mg 100 mL/hr over 60 Minutes Intravenous  Once 01/29/22 0600 01/29/22 0903   01/29/22 0615  vancomycin (VANCOCIN) IVPB 1000 mg/200 mL premix  Status:  Discontinued        1,000 mg 200 mL/hr over 60 Minutes Intravenous  Once 01/29/22 0600 01/29/22 0604   01/29/22 0615  Vancomycin (VANCOCIN) 1,250 mg in sodium chloride 0.9 % 250 mL IVPB         1,250 mg 166.7 mL/hr over 90 Minutes Intravenous  Once 01/29/22 0604 01/29/22 0934        MEDICATIONS: Scheduled Meds:  amLODipine  10 mg Oral Daily   apixaban  5 mg Oral BID   calcium acetate  1,334 mg Oral TID WC   carvedilol  25 mg Oral BID WC   Chlorhexidine Gluconate Cloth  6 each Topical Q0600   cloNIDine  0.3 mg Oral TID   colchicine  0.3 mg Oral Daily   darbepoetin (ARANESP) injection - DIALYSIS  200 mcg Intravenous Q Tue-HD   feeding supplement (NEPRO CARB STEADY)  237 mL Oral BID BM   hydrALAZINE  100 mg Oral TID   hydroxychloroquine  100 mg Oral Q48H   nicotine  14 mg Transdermal Daily   predniSONE  40 mg Oral Q breakfast   sodium chloride flush  3 mL Intravenous Q12H   sodium zirconium cyclosilicate  10 g Oral Daily   Continuous Infusions:  vancomycin Stopped (02/04/22 1435)   PRN Meds:.acetaminophen **OR** acetaminophen, albuterol, calcium carbonate (dosed in mg elemental calcium), docusate sodium, feeding supplement (NEPRO CARB STEADY), HYDROmorphone (DILAUDID) injection, hydrOXYzine, methocarbamol, ondansetron **OR** ondansetron (ZOFRAN) IV, oxyCODONE, phenol, sorbitol, zolpidem   I have personally reviewed following labs and imaging studies  LABORATORY DATA: CBC: Recent Labs  Lab 01/30/22 0432 01/31/22 0202 02/01/22 0100 02/02/22 0203 02/04/22 1000  WBC 11.8* 13.2* 11.7* 13.3* 11.9*  NEUTROABS  --   --   --   --  10.7*  HGB 10.0* 9.3* 9.2* 8.9* 9.5*  HCT 32.1* 29.8* 29.2* 28.7* 29.6*  MCV 91.7 91.7 91.8 90.8 91.4  PLT 155 136* 137* 174 239     Basic Metabolic Panel: Recent Labs  Lab 01/30/22 0432 01/31/22 0202 02/01/22 0100 02/01/22 1121 02/02/22 0203 02/04/22 1030  NA 134* 130* 130*  --  134* 134*  K 4.7 4.9 5.7* 5.1 3.9 4.5  CL 95* 93* 93*  --  95* 95*  CO2 25 23 21*  --  25 23  GLUCOSE 89 118* 104*  --  98 112*  BUN 42* 58* 82*  --  42* 109*  CREATININE 5.87* 6.91* 7.71*  --  4.87* 7.97*  CALCIUM 7.4* 7.7* 8.1*  --  7.5* 8.3*   PHOS  --  5.1* 4.0  --  3.2 4.1     GFR: Estimated Creatinine Clearance: 9.3 mL/min (A) (  by C-G formula based on SCr of 7.97 mg/dL (H)).  Liver Function Tests: Recent Labs  Lab 01/31/22 0202 02/01/22 0100 02/02/22 0203 02/04/22 1030  ALBUMIN 2.5* 2.6* 2.4* 2.6*    No results for input(s): LIPASE, AMYLASE in the last 168 hours. No results for input(s): AMMONIA in the last 168 hours.  Coagulation Profile: No results for input(s): INR, PROTIME in the last 168 hours.   Cardiac Enzymes: No results for input(s): CKTOTAL, CKMB, CKMBINDEX, TROPONINI in the last 168 hours.  BNP (last 3 results) No results for input(s): PROBNP in the last 8760 hours.  Lipid Profile: No results for input(s): CHOL, HDL, LDLCALC, TRIG, CHOLHDL, LDLDIRECT in the last 72 hours.  Thyroid Function Tests: No results for input(s): TSH, T4TOTAL, FREET4, T3FREE, THYROIDAB in the last 72 hours.  Anemia Panel: No results for input(s): VITAMINB12, FOLATE, FERRITIN, TIBC, IRON, RETICCTPCT in the last 72 hours.  Urine analysis:    Component Value Date/Time   COLORURINE YELLOW 11/29/2020 2054   APPEARANCEUR HAZY (A) 11/29/2020 2054   LABSPEC 1.015 11/29/2020 2054   PHURINE 6.0 11/29/2020 2054   GLUCOSEU NEGATIVE 11/29/2020 2054   HGBUR MODERATE (A) 11/29/2020 2054   BILIRUBINUR NEGATIVE 11/29/2020 2054   KETONESUR NEGATIVE 11/29/2020 2054   PROTEINUR >=300 (A) 11/29/2020 2054   UROBILINOGEN 0.2 10/17/2020 1026   NITRITE NEGATIVE 11/29/2020 2054   LEUKOCYTESUR NEGATIVE 11/29/2020 2054    Sepsis Labs: Lactic Acid, Venous    Component Value Date/Time   LATICACIDVEN 3.1 (HH) 01/29/2022 0748    MICROBIOLOGY: Recent Results (from the past 240 hour(s))  Resp Panel by RT-PCR (Flu A&B, Covid) Nasopharyngeal Swab     Status: None   Collection Time: 01/29/22  2:18 AM   Specimen: Nasopharyngeal Swab; Nasopharyngeal(NP) swabs in vial transport medium  Result Value Ref Range Status   SARS Coronavirus 2  by RT PCR NEGATIVE NEGATIVE Final    Comment: (NOTE) SARS-CoV-2 target nucleic acids are NOT DETECTED.  The SARS-CoV-2 RNA is generally detectable in upper respiratory specimens during the acute phase of infection. The lowest concentration of SARS-CoV-2 viral copies this assay can detect is 138 copies/mL. A negative result does not preclude SARS-Cov-2 infection and should not be used as the sole basis for treatment or other patient management decisions. A negative result may occur with  improper specimen collection/handling, submission of specimen other than nasopharyngeal swab, presence of viral mutation(s) within the areas targeted by this assay, and inadequate number of viral copies(<138 copies/mL). A negative result must be combined with clinical observations, patient history, and epidemiological information. The expected result is Negative.  Fact Sheet for Patients:  EntrepreneurPulse.com.au  Fact Sheet for Healthcare Providers:  IncredibleEmployment.be  This test is no t yet approved or cleared by the [place] FDA and  has been authorized for detection and/or diagnosis of SARS-CoV-2 by FDA under an Emergency Use Authorization (EUA). This EUA will remain  in effect (meaning this test can be used) for the duration of the COVID-19 declaration under Section 564(b)(1) of the Act, 21 U.S.C.section 360bbb-3(b)(1), unless the authorization is terminated  or revoked sooner.       Influenza A by PCR NEGATIVE NEGATIVE Final   Influenza B by PCR NEGATIVE NEGATIVE Final    Comment: (NOTE) The Xpert Xpress SARS-CoV-2/FLU/RSV plus assay is intended as an aid in the diagnosis of influenza from Nasopharyngeal swab specimens and should not be used as a sole basis for treatment. Nasal washings and aspirates are unacceptable for Xpert Xpress  SARS-CoV-2/FLU/RSV testing.  Fact Sheet for Patients: EntrepreneurPulse.com.au  Fact  Sheet for Healthcare Providers: IncredibleEmployment.be  This test is not yet approved or cleared by the [place] FDA and has been authorized for detection and/or diagnosis of SARS-CoV-2 by FDA under an Emergency Use Authorization (EUA). This EUA will remain in effect (meaning this test can be used) for the duration of the COVID-19 declaration under Section 564(b)(1) of the Act, 21 U.S.C. section 360bbb-3(b)(1), unless the authorization is terminated or revoked.  Performed at [facility], [place] [address], [place], [place] [address]   Blood Culture (routine x 2)     Status: Abnormal   Collection Time: 01/29/22  2:30 AM   Specimen: BLOOD LEFT ARM  Result Value Ref Range Status   Specimen Description BLOOD LEFT ARM  Final   Special Requests   Final    BOTTLES DRAWN AEROBIC AND ANAEROBIC Blood Culture results may not be optimal due to an excessive volume of blood received in culture bottles   Culture  Setup Time   Final    GRAM POSITIVE COCCI IN CLUSTERS IN BOTH AEROBIC AND ANAEROBIC BOTTLES CRITICAL RESULT CALLED TO, READ BACK BY AND VERIFIED WITH: PHARMD M.WEISKOPH AT 8299 ON 01/29/2022 BY T.SAAD. Performed at [facility], [place] [address], [place], [place] [address]    Culture METHICILLIN RESISTANT STAPHYLOCOCCUS AUREUS (A)  Final   Report Status 01/31/2022 FINAL  Final   Organism ID, Bacteria METHICILLIN RESISTANT STAPHYLOCOCCUS AUREUS  Final      Susceptibility   Methicillin resistant staphylococcus aureus - MIC*    CIPROFLOXACIN <=0.5 SENSITIVE Sensitive     ERYTHROMYCIN >=8 RESISTANT Resistant     GENTAMICIN <=0.5 SENSITIVE Sensitive     OXACILLIN >=4 RESISTANT Resistant     TETRACYCLINE <=1 SENSITIVE Sensitive     VANCOMYCIN 1 SENSITIVE Sensitive     TRIMETH/SULFA <=10 SENSITIVE Sensitive     CLINDAMYCIN <=0.25 SENSITIVE Sensitive     RIFAMPIN <=0.5 SENSITIVE Sensitive     Inducible Clindamycin NEGATIVE Sensitive     * METHICILLIN  RESISTANT STAPHYLOCOCCUS AUREUS  Blood Culture ID Panel (Reflexed)     Status: Abnormal   Collection Time: 01/29/22  2:30 AM  Result Value Ref Range Status   Enterococcus faecalis NOT DETECTED NOT DETECTED Final   Enterococcus Faecium NOT DETECTED NOT DETECTED Final   Listeria monocytogenes NOT DETECTED NOT DETECTED Final   Staphylococcus species DETECTED (A) NOT DETECTED Final    Comment: CRITICAL RESULT CALLED TO, READ BACK BY AND VERIFIED WITH: PHARMD M.WEISKOPH AT 1449 ON 01/29/2022 BY T.SAAD.    Staphylococcus aureus (BCID) DETECTED (A) NOT DETECTED Final    Comment: Methicillin (oxacillin)-resistant Staphylococcus aureus (MRSA). MRSA is predictably resistant to beta-lactam antibiotics (except ceftaroline). Preferred therapy is vancomycin unless clinically contraindicated. Patient requires contact precautions if  hospitalized. CRITICAL RESULT CALLED TO, READ BACK BY AND VERIFIED WITH: PHARMD M.WEISKOPH AT 1449 ON 01/29/2022 BY T.SAAD.    Staphylococcus epidermidis NOT DETECTED NOT DETECTED Final   Staphylococcus lugdunensis NOT DETECTED NOT DETECTED Final   Streptococcus species NOT DETECTED NOT DETECTED Final   Streptococcus agalactiae NOT DETECTED NOT DETECTED Final   Streptococcus pneumoniae NOT DETECTED NOT DETECTED Final   Streptococcus pyogenes NOT DETECTED NOT DETECTED Final   A.calcoaceticus-baumannii NOT DETECTED NOT DETECTED Final   Bacteroides fragilis NOT DETECTED NOT DETECTED Final   Enterobacterales NOT DETECTED NOT DETECTED Final   Enterobacter cloacae complex NOT DETECTED NOT DETECTED Final   Escherichia coli NOT  DETECTED NOT DETECTED Final   Klebsiella aerogenes NOT DETECTED NOT DETECTED Final   Klebsiella oxytoca NOT DETECTED NOT DETECTED Final   Klebsiella pneumoniae NOT DETECTED NOT DETECTED Final   Proteus species NOT DETECTED NOT DETECTED Final   Salmonella species NOT DETECTED NOT DETECTED Final   Serratia marcescens NOT DETECTED NOT DETECTED Final    Haemophilus influenzae NOT DETECTED NOT DETECTED Final   Neisseria meningitidis NOT DETECTED NOT DETECTED Final   Pseudomonas aeruginosa NOT DETECTED NOT DETECTED Final   Stenotrophomonas maltophilia NOT DETECTED NOT DETECTED Final   Candida albicans NOT DETECTED NOT DETECTED Final   Candida auris NOT DETECTED NOT DETECTED Final   Candida glabrata NOT DETECTED NOT DETECTED Final   Candida krusei NOT DETECTED NOT DETECTED Final   Candida parapsilosis NOT DETECTED NOT DETECTED Final   Candida tropicalis NOT DETECTED NOT DETECTED Final   Cryptococcus neoformans/gattii NOT DETECTED NOT DETECTED Final   Meth resistant mecA/C and MREJ DETECTED (A) NOT DETECTED Final    Comment: CRITICAL RESULT CALLED TO, READ BACK BY AND VERIFIED WITH: PHARMD M.WEISKOPH AT 1449 ON 01/29/2022 BY T.SAAD. Performed at [facility], [place] [address], [place], [place] [address]   Blood Culture (routine x 2)     Status: Abnormal   Collection Time: 01/29/22  2:40 AM   Specimen: BLOOD LEFT HAND  Result Value Ref Range Status   Specimen Description BLOOD LEFT HAND  Final   Special Requests   Final    BOTTLES DRAWN AEROBIC ONLY Blood Culture results may not be optimal due to an excessive volume of blood received in culture bottles   Culture  Setup Time   Final    GRAM POSITIVE COCCI IN CLUSTERS AEROBIC BOTTLE ONLY CRITICAL VALUE NOTED.  VALUE IS CONSISTENT WITH PREVIOUSLY REPORTED AND CALLED VALUE.    Culture (A)  Final    STAPHYLOCOCCUS AUREUS SUSCEPTIBILITIES PERFORMED ON PREVIOUS CULTURE WITHIN THE LAST 5 DAYS. Performed at [facility], [place] [address] [place], [place] [address]    Report Status 01/31/2022 FINAL  Final  Culture, blood (single)     Status: Abnormal   Collection Time: 01/29/22  5:56 AM   Specimen: BLOOD  Result Value Ref Range Status   Specimen Description BLOOD CENTRAL LINE  Final   Special Requests   Final    BOTTLES DRAWN AEROBIC AND ANAEROBIC Blood Culture adequate volume    Culture  Setup Time   Final    GRAM POSITIVE COCCI IN CLUSTERS IN BOTH AEROBIC AND ANAEROBIC BOTTLES CRITICAL VALUE NOTED.  VALUE IS CONSISTENT WITH PREVIOUSLY REPORTED AND CALLED VALUE.    Culture (A)  Final    STAPHYLOCOCCUS AUREUS SUSCEPTIBILITIES PERFORMED ON PREVIOUS CULTURE WITHIN THE LAST 5 DAYS. Performed at [facility], [facility] [address] [place], [place] [address]    Report Status 01/31/2022 FINAL  Final  Culture, blood (routine x 2)     Status: None   Collection Time: 01/30/22  4:32 AM   Specimen: BLOOD  Result Value Ref Range Status   Specimen Description BLOOD LEFT ANTECUBITAL  Final   Special Requests   Final    BOTTLES DRAWN AEROBIC AND ANAEROBIC Blood Culture adequate volume   Culture   Final    NO GROWTH 5 DAYS Performed at [facility], [place] [address], [place], [place] [address]    Report Status 02/04/2022 FINAL  Final  Culture, blood (routine x 2)     Status: Abnormal  Collection Time: 01/30/22  4:45 AM   Specimen: BLOOD  Result Value Ref Range Status   Specimen Description BLOOD BLOOD RIGHT WRIST  Final   Special Requests   Final    BOTTLES DRAWN AEROBIC AND ANAEROBIC Blood Culture adequate volume   Culture  Setup Time   Final    GRAM POSITIVE COCCI IN CLUSTERS ANAEROBIC BOTTLE ONLY CRITICAL VALUE NOTED.  VALUE IS CONSISTENT WITH PREVIOUSLY REPORTED AND CALLED VALUE.    Culture (A)  Final    STAPHYLOCOCCUS AUREUS SUSCEPTIBILITIES PERFORMED ON PREVIOUS CULTURE WITHIN THE LAST 5 DAYS. Performed at [facility], [place] [address], [place], [place] [address]    Report Status 02/05/2022 FINAL  Final  Culture, blood (routine x 2)     Status: None (Preliminary result)   Collection Time: 02/04/22  3:48 PM   Specimen: BLOOD  Result Value Ref Range Status   Specimen Description BLOOD LEFT ANTECUBITAL  Final   Special Requests   Final    BOTTLES DRAWN AEROBIC AND ANAEROBIC Blood Culture adequate volume   Culture   Final    NO GROWTH < 24  HOURS Performed at [facility], [place] [address], [place], [place] [address]    Report Status PENDING  Incomplete  Culture, blood (routine x 2)     Status: None (Preliminary result)   Collection Time: 02/04/22  3:56 PM   Specimen: BLOOD  Result Value Ref Range Status   Specimen Description BLOOD RIGHT ANTECUBITAL  Final   Special Requests   Final    BOTTLES DRAWN AEROBIC AND ANAEROBIC Blood Culture adequate volume   Culture   Final    NO GROWTH < 24 HOURS Performed at [facility], [place] [address], [place], [place] [address]    Report Status PENDING  Incomplete    RADIOLOGY STUDIES/RESULTS: No results found.   LOS: 7 days   Oren Binet, MD  [facility]    To contact the attending provider between 7A-7P or the covering provider during after hours 7P-7A, please log into the web site [email or web address] and access using universal  password for that web site. If you do not have the password, please call the hospital operator.  02/05/2022, 3:17 PM

## 2022-02-05 NOTE — Progress Notes (Signed)
?[facility] ?Progress Note  ? ?Subjective:    ?Seen and examined patient at bedside. Tolerated yesterday's HD with net UF 3.5L. Denies SOB and CP. Bc from 3/2 (+) staph and repeat cx obtained yesterday. AVF placement cancelled until infection has improved. Plan for HD 3/9. ? ?Objective ?Vitals:  ? 02/05/22 0132 02/05/22 0604 02/05/22 0743 02/05/22 1151  ?BP: (!) 156/107 (!) 168/144 (!) 183/116 138/90  ?Pulse: 62 69 61 63  ?Resp: 19 15 14 17   ?Temp: 98.2 ?F (36.8 ?C) 98 ?F (36.7 ?C) 98.6 ?F (37 ?C) 98.6 ?F (37 ?C)  ?TempSrc: Oral Oral Oral Oral  ?SpO2: 100% 100%    ?Weight:      ?Height:      ? ?Physical Exam ?General: Alert young female NAD ?Heart: RRR, no MRG  ?Lungs: CTA nonlabored breathing ?Abdomen: NABS, mild ascites, NT, soft ?Extremities: No edema BLLE  ?Vascular Access: S/p R TDC 3/4  ? ?Filed Weights  ? 01/29/22 1135 02/01/22 1436 02/01/22 1851  ?Weight: 63.5 kg 72.6 kg 69.1 kg  ? ? ?Intake/Output Summary (Last 24 hours) at 02/05/2022 1447 ?Last data filed at 02/05/2022 0300 ?Gross per 24 hour  ?Intake 104.89 ml  ?Output --  ?Net 104.89 ml  ? ? ?Additional Objective ?Labs: ?Basic Metabolic Panel: ?Recent Labs  ?Lab 02/01/22 ?0100 02/01/22 ?1121 02/02/22 ?0203 02/04/22 ?1030  ?NA 130*  --  134* 134*  ?K 5.7* 5.1 3.9 4.5  ?CL 93*  --  95* 95*  ?CO2 21*  --  25 23  ?GLUCOSE 104*  --  98 112*  ?BUN 82*  --  42* 109*  ?CREATININE 7.71*  --  4.87* 7.97*  ?CALCIUM 8.1*  --  7.5* 8.3*  ?PHOS 4.0  --  3.2 4.1  ? ?Liver Function Tests: ?Recent Labs  ?Lab 02/01/22 ?0100 02/02/22 ?0203 02/04/22 ?1030  ?ALBUMIN 2.6* 2.4* 2.6*  ? ?No results for input(s): LIPASE, AMYLASE in the last 168 hours. ?CBC: ?Recent Labs  ?Lab 01/30/22 ?0432 01/31/22 ?0202 02/01/22 ?0100 02/02/22 ?0203 02/04/22 ?1000  ?WBC 11.8* 13.2* 11.7* 13.3* 11.9*  ?NEUTROABS  --   --   --   --  10.7*  ?HGB 10.0* 9.3* 9.2* 8.9* 9.5*  ?HCT 32.1* 29.8* 29.2* 28.7* 29.6*  ?MCV 91.7 91.7 91.8 90.8 91.4  ?PLT 155 136* 137* 174 239  ? ?Blood Culture ?    ?Component Value Date/Time  ? SDES BLOOD RIGHT ANTECUBITAL 02/04/2022 1556  ? SPECREQUEST  02/04/2022 1556  ?  BOTTLES DRAWN AEROBIC AND ANAEROBIC Blood Culture adequate volume  ? CULT  02/04/2022 1556  ?  NO GROWTH < 24 HOURS ?Performed at [facility], [place] [address], [place], [place] [address] ?  ? REPTSTATUS PENDING 02/04/2022 1556  ? ? ?Cardiac Enzymes: ?No results for input(s): CKTOTAL, CKMB, CKMBINDEX, TROPONINI in the last 168 hours. ?CBG: ?No results for input(s): GLUCAP in the last 168 hours. ?Iron Studies: No results for input(s): IRON, TIBC, TRANSFERRIN, FERRITIN in the last 72 hours. ?Lab Results  ?Component Value Date  ? INR 2.1 (H) 01/29/2022  ? INR 1.2 12/27/2021  ? INR 1.2 12/13/2021  ? ?Studies/Results: ?No results found. ? ?Medications: ? vancomycin Stopped (02/04/22 1435)  ? ? amLODipine  10 mg Oral Daily  ? apixaban  5 mg Oral BID  ? calcium acetate  1,334 mg Oral TID WC  ? carvedilol  25 mg Oral BID WC  ? Chlorhexidine Gluconate Cloth  6 each Topical Q0600  ? cloNIDine  0.3 mg Oral TID  ? colchicine  0.3 mg Oral Daily  ? darbepoetin (ARANESP) injection - DIALYSIS  200 mcg Intravenous Q Tue-HD  ? feeding supplement (NEPRO CARB STEADY)  237 mL Oral BID BM  ? hydrALAZINE  100 mg Oral TID  ? hydroxychloroquine  100 mg Oral Q48H  ? nicotine  14 mg Transdermal Daily  ? predniSONE  40 mg Oral Q breakfast  ? sodium chloride flush  3 mL Intravenous Q12H  ? sodium zirconium cyclosilicate  10 g Oral Daily  ? ? ?Dialysis Orders: ?Dialyzes at [facility] TTS 4 hrs EDW 52 kg ?2K/ 2 Ca bath ?BFR 400 via TDC, DFR 800 ?Hectorol 3 q rx ?Venofer 100 q rx x 10, ending 02/08/22 ?Mircera 225 q 2 weeks, last given 01/25/22 ? ?Assessment/Plan: ?1 SIRS due to MRSA bacteremia sepsis- S/p R TDC 3/4 after line holiday. S/p TEE 3/6. Noted repeat BC from 3/2 (+) staph aureus. IR said that there is no biofilm yet w/ a new TDC so it dose not need to be removed. Appreciate ID and IR assistance. Repeat blood cx obtained 3/7. ?2  ESRD: TTS HD. Plan for HD 3/9. Patient agrees for arm access now. VVS consulted-AVF placement cancelled for today until infection has improved-appreciate their assistance.  ?3 Hypertension/volume-BP has improved since UF on HD, BP meds now still needed with hypertension. Push UF with HD. ?4. 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 Hold Venofer  with bacteremia  ?5.  MBD: Continue hectorol and binders, corrected calcium and phosphorus stable ?6.  Lupus: on pred and plaquenil ?7.  Nutrition: albumin 2 4 add protein supplement to diet ? ? ?Tobie Poet, NP ?[facility] ?02/05/2022,2:47 PM ? LOS: 7 days  ?  ?

## 2022-02-06 LAB — CBC
HCT: 30.9 % — ABNORMAL LOW (ref 36.0–46.0)
Hemoglobin: 9.4 g/dL — ABNORMAL LOW (ref 12.0–15.0)
MCH: 28.4 pg (ref 26.0–34.0)
MCHC: 30.4 g/dL (ref 30.0–36.0)
MCV: 93.4 fL (ref 80.0–100.0)
Platelets: 271 10*3/uL (ref 150–400)
RBC: 3.31 MIL/uL — ABNORMAL LOW (ref 3.87–5.11)
RDW: 22.1 % — ABNORMAL HIGH (ref 11.5–15.5)
WBC: 14.5 10*3/uL — ABNORMAL HIGH (ref 4.0–10.5)
nRBC: 1.7 % — ABNORMAL HIGH (ref 0.0–0.2)

## 2022-02-06 LAB — RENAL FUNCTION PANEL
Albumin: 2.6 g/dL — ABNORMAL LOW (ref 3.5–5.0)
Anion gap: 17 — ABNORMAL HIGH (ref 5–15)
BUN: 109 mg/dL — ABNORMAL HIGH (ref 6–20)
CO2: 22 mmol/L (ref 22–32)
Calcium: 8.7 mg/dL — ABNORMAL LOW (ref 8.9–10.3)
Chloride: 94 mmol/L — ABNORMAL LOW (ref 98–111)
Creatinine, Ser: 7.47 mg/dL — ABNORMAL HIGH (ref 0.44–1.00)
GFR, Estimated: 7 mL/min — ABNORMAL LOW (ref >60)
Glucose, Bld: 128 mg/dL — ABNORMAL HIGH (ref 70–99)
Phosphorus: 3.1 mg/dL (ref 2.5–4.6)
Potassium: 4.8 mmol/L (ref 3.5–5.1)
Sodium: 133 mmol/L — ABNORMAL LOW (ref 135–145)

## 2022-02-06 MED ORDER — HEPARIN SODIUM (PORCINE) 1000 UNIT/ML IJ SOLN
INTRAMUSCULAR | Status: AC
  Administered 2022-02-06: 1000 [IU]
  Filled 2022-02-06: qty 4

## 2022-02-06 MED ORDER — LOSARTAN POTASSIUM 50 MG PO TABS
50.0000 mg | ORAL_TABLET | Freq: Every day | ORAL | Status: DC
  Administered 2022-02-06 – 2022-02-07 (×2): 50 mg via ORAL
  Filled 2022-02-06 (×2): qty 1

## 2022-02-06 NOTE — Progress Notes (Signed)
Nutrition Brief Note ? ?Patient identified on the Malnutrition Screening Tool (MST) Report. ? ?Wt Readings from Last 15 Encounters:  ?02/01/22 69.1 kg  ?01/09/22 61.6 kg  ?12/30/21 52 kg  ?12/17/21 46.4 kg  ?06/15/21 65.8 kg  ?06/10/21 68 kg  ?06/06/21 66.7 kg  ?06/05/21 66.7 kg  ?01/20/21 80.5 kg  ?12/26/20 85 kg  ?12/13/20 86 kg  ?11/13/20 76 kg  ?11/06/20 64.9 kg  ?10/17/20 63.5 kg  ?08/07/20 59.4 kg  ? ? ?Body mass index is 23.86 kg/m?Marland Kitchen Patient meets criteria for normal based on current BMI.  ? ?Pt laying in bed at time of RD visit. Breakfast tray at bedside completed. ?Pt reports that her appetite is great currently and PTA. Pt reports that she thinks that she is eating too much. Pt denies any nausea or vomiting with dialysis.  ?Pt denies any weight loss, believes that she has actually gained weight recently.  ? ?Current diet order is Renal w/ 1200 mL fluid restriction. Pt with one meal intake recorded of 100% on 3/4. Labs and medications reviewed.  ? ?Pt with Nepro already ordered. No additional nutrition interventions warranted at this time.  ?If nutrition issues arise, please consult RD.  ? ? ? ?Hermina Barters RD, LDN ?Clinical Dietitian ?See AMiON for contact information.  ? ? ?

## 2022-02-06 NOTE — Progress Notes (Addendum)
[facility] Infectious Diseases Follow Up Note  Patient Identification: Patient Name: Cathy [name] MRN: [id number] [place] Date: 01/29/2022  2:16 AM Age: 41 y.o.Today's Date: 02/06/2022   Reason for Visit: MRSA bacteremia  Principal Problem:   MRSA bacteremia Active Problems:   Renal hypertension   Lupus nephritis ([place])   ESRD on hemodialysis ([place])   Chronic combined systolic and diastolic congestive heart failure (HCC)   Acute pulmonary embolus (HCC)   Tobacco dependence  Antibiotics:  Cefepime and metronidazole 2/28 Vancomycin 3/1-c    Lines/Hardware: RT chest HD catheter ( removed on 3/1 and new placed 3/4)  Interval Events: Remains afebrile, no leukocytosis   Assessment # Complicated MRSA bacteremia/CLABSI secondary to HD catheter (removed on 3/1 and placed on 3/4) -TEE - no vegetations or endocraditis  -Blood cx 3/2 Staph aureus in 1/4 bottles  -Blood cx 3/7 no growth in 2 days  -Per IR newly placed HD catheter on 3/4 less likely to be colonized and hence, did not recommend removal   # Leukocytosis - downtrending  # SLE-on Plaquenil and prednisone  # ESRD on hemodialysis  # 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 TTE concerning for possible infiltrative cardiomyopathy - Further work up per PCP and Cardiology. No pericardial effusion in TEE.   # History of MSSA bacteremia in July 2022, treated   Recommendations Continue vancomycin, pharmacy to dose Follow up blood cultures on 3/7 for clearance. Of note, her blood cultures on 3/2 was reported positive after 5 days on 3/7. Ideally, I would recommend to wait her blood cultures to be negative ( final ) before placing a new AVF and avoid chances of seeding of newly placed hardware. It seems she is clearing the bacteremia given prolonged time to positivity with no new sources of infection identified   Monitor CBC and BMP, Vancomycin trough  She will need 4 weeks of Vancomycin with HD from date of negative blood cultures on 3/7 assuming they remain negative. Call us  back if repeat blood cultures are + or with any concerns. ID will sign off for now.   Rest of the management as per the primary team. Thank you for the consult. Please page with pertinent questions or concerns.  ______________________________________________________________________ Subjective patient seen and examined at the bedside.  She is resting in bed Eager to go home. Otherwise, no new concerns   Vitals BP (!) 191/116    Pulse 65    Temp 98 F (36.7 C) (Oral)    Resp 20    Ht 5\' 7"  (1.702 m)    Wt 69.1 kg    SpO2 100%    BMI 23.86 kg/m     Physical Exam Constitutional: Sleeping in bed     Comments:   Cardiovascular:     Rate and Rhythm: Normal rate and regular rhythm.     Heart sounds: Rt chest HD catheter - non tender   Pulmonary:     Effort: Pulmonary effort is normal on room air    Comments:   Abdominal:     Palpations: Abdomen is soft.     Tenderness: Nontender and nondistended  Musculoskeletal:        General: No swelling or tenderness of peripheral joints. No back pain   Skin:    Comments:   Neurological:     General: Grossly nonfocal.  Awake alert and oriented  Psychiatric:        Mood and Affect: Mood normal.  Pertinent Microbiology Results for orders placed or performed during the hospital encounter of 01/29/22  Resp Panel by RT-PCR (Flu A&B, Covid) Nasopharyngeal Swab     Status: None   Collection Time: 01/29/22  2:18 AM   Specimen: Nasopharyngeal Swab; Nasopharyngeal(NP) swabs in vial transport medium  Result Value Ref Range Status   SARS Coronavirus 2 by RT PCR NEGATIVE NEGATIVE Final    Comment: (NOTE) SARS-CoV-2 target nucleic acids are NOT DETECTED.  The SARS-CoV-2 RNA is generally detectable in upper respiratory specimens during the acute phase of infection. The  lowest concentration of SARS-CoV-2 viral copies this assay can detect is 138 copies/mL. A negative result does not preclude SARS-Cov-2 infection and should not be used as the sole basis for treatment or other patient management decisions. A negative result may occur with  improper specimen collection/handling, submission of specimen other than nasopharyngeal swab, presence of viral mutation(s) within the areas targeted by this assay, and inadequate number of viral copies(<138 copies/mL). A negative result must be combined with clinical observations, patient history, and epidemiological information. The expected result is Negative.  Fact Sheet for Patients:  EntrepreneurPulse.com.au  Fact Sheet for Healthcare Providers:  IncredibleEmployment.be  This test is no t yet approved or cleared by the [place] FDA and  has been authorized for detection and/or diagnosis of SARS-CoV-2 by FDA under an Emergency Use Authorization (EUA). This EUA will remain  in effect (meaning this test can be used) for the duration of the COVID-19 declaration under Section 564(b)(1) of the Act, 21 U.S.C.section 360bbb-3(b)(1), unless the authorization is terminated  or revoked sooner.       Influenza A by PCR NEGATIVE NEGATIVE Final   Influenza B by PCR NEGATIVE NEGATIVE Final    Comment: (NOTE) The Xpert Xpress SARS-CoV-2/FLU/RSV plus assay is intended as an aid in the diagnosis of influenza from Nasopharyngeal swab specimens and should not be used as a sole basis for treatment. Nasal washings and aspirates are unacceptable for Xpert Xpress SARS-CoV-2/FLU/RSV testing.  Fact Sheet for Patients: EntrepreneurPulse.com.au  Fact Sheet for Healthcare Providers: IncredibleEmployment.be  This test is not yet approved or cleared by the [place] FDA and has been authorized for detection and/or diagnosis of SARS-CoV-2 by FDA under  an Emergency Use Authorization (EUA). This EUA will remain in effect (meaning this test can be used) for the duration of the COVID-19 declaration under Section 564(b)(1) of the Act, 21 U.S.C. section 360bbb-3(b)(1), unless the authorization is terminated or revoked.  Performed at [facility], [place] [address], [place], [place] [address]   Blood Culture (routine x 2)     Status: Abnormal   Collection Time: 01/29/22  2:30 AM   Specimen: BLOOD LEFT ARM  Result Value Ref Range Status   Specimen Description BLOOD LEFT ARM  Final   Special Requests   Final    BOTTLES DRAWN AEROBIC AND ANAEROBIC Blood Culture results may not be optimal due to an excessive volume of blood received in culture bottles   Culture  Setup Time   Final    GRAM POSITIVE COCCI IN CLUSTERS IN BOTH AEROBIC AND ANAEROBIC BOTTLES CRITICAL RESULT CALLED TO, READ BACK BY AND VERIFIED WITH: PHARMD M.WEISKOPH AT 4158 ON 01/29/2022 BY T.SAAD. Performed at [facility], [place] [address], [place], [place] [address]    Culture METHICILLIN RESISTANT STAPHYLOCOCCUS AUREUS (A)  Final   Report Status 01/31/2022 FINAL  Final   Organism ID, Bacteria METHICILLIN RESISTANT STAPHYLOCOCCUS AUREUS  Final  Susceptibility   Methicillin resistant staphylococcus aureus - MIC*    CIPROFLOXACIN <=0.5 SENSITIVE Sensitive     ERYTHROMYCIN >=8 RESISTANT Resistant     GENTAMICIN <=0.5 SENSITIVE Sensitive     OXACILLIN >=4 RESISTANT Resistant     TETRACYCLINE <=1 SENSITIVE Sensitive     VANCOMYCIN 1 SENSITIVE Sensitive     TRIMETH/SULFA <=10 SENSITIVE Sensitive     CLINDAMYCIN <=0.25 SENSITIVE Sensitive     RIFAMPIN <=0.5 SENSITIVE Sensitive     Inducible Clindamycin NEGATIVE Sensitive     * METHICILLIN RESISTANT STAPHYLOCOCCUS AUREUS  Blood Culture ID Panel (Reflexed)     Status: Abnormal   Collection Time: 01/29/22  2:30 AM  Result Value Ref Range Status   Enterococcus faecalis NOT DETECTED NOT DETECTED Final   Enterococcus  Faecium NOT DETECTED NOT DETECTED Final   Listeria monocytogenes NOT DETECTED NOT DETECTED Final   Staphylococcus species DETECTED (A) NOT DETECTED Final    Comment: CRITICAL RESULT CALLED TO, READ BACK BY AND VERIFIED WITH: PHARMD M.WEISKOPH AT 1449 ON 01/29/2022 BY T.SAAD.    Staphylococcus aureus (BCID) DETECTED (A) NOT DETECTED Final    Comment: Methicillin (oxacillin)-resistant Staphylococcus aureus (MRSA). MRSA is predictably resistant to beta-lactam antibiotics (except ceftaroline). Preferred therapy is vancomycin unless clinically contraindicated. Patient requires contact precautions if  hospitalized. CRITICAL RESULT CALLED TO, READ BACK BY AND VERIFIED WITH: PHARMD M.WEISKOPH AT 1449 ON 01/29/2022 BY T.SAAD.    Staphylococcus epidermidis NOT DETECTED NOT DETECTED Final   Staphylococcus lugdunensis NOT DETECTED NOT DETECTED Final   Streptococcus species NOT DETECTED NOT DETECTED Final   Streptococcus agalactiae NOT DETECTED NOT DETECTED Final   Streptococcus pneumoniae NOT DETECTED NOT DETECTED Final   Streptococcus pyogenes NOT DETECTED NOT DETECTED Final   A.calcoaceticus-baumannii NOT DETECTED NOT DETECTED Final   Bacteroides fragilis NOT DETECTED NOT DETECTED Final   Enterobacterales NOT DETECTED NOT DETECTED Final   Enterobacter cloacae complex NOT DETECTED NOT DETECTED Final   Escherichia coli NOT DETECTED NOT DETECTED Final   Klebsiella aerogenes NOT DETECTED NOT DETECTED Final   Klebsiella oxytoca NOT DETECTED NOT DETECTED Final   Klebsiella pneumoniae NOT DETECTED NOT DETECTED Final   Proteus species NOT DETECTED NOT DETECTED Final   Salmonella species NOT DETECTED NOT DETECTED Final   Serratia marcescens NOT DETECTED NOT DETECTED Final   Haemophilus influenzae NOT DETECTED NOT DETECTED Final   Neisseria meningitidis NOT DETECTED NOT DETECTED Final   Pseudomonas aeruginosa NOT DETECTED NOT DETECTED Final   Stenotrophomonas maltophilia NOT DETECTED NOT DETECTED Final    Candida albicans NOT DETECTED NOT DETECTED Final   Candida auris NOT DETECTED NOT DETECTED Final   Candida glabrata NOT DETECTED NOT DETECTED Final   Candida krusei NOT DETECTED NOT DETECTED Final   Candida parapsilosis NOT DETECTED NOT DETECTED Final   Candida tropicalis NOT DETECTED NOT DETECTED Final   Cryptococcus neoformans/gattii NOT DETECTED NOT DETECTED Final   Meth resistant mecA/C and MREJ DETECTED (A) NOT DETECTED Final    Comment: CRITICAL RESULT CALLED TO, READ BACK BY AND VERIFIED WITH: PHARMD M.WEISKOPH AT 1449 ON 01/29/2022 BY T.SAAD. Performed at [facility], [place] [address] [place], [place] [address]   Blood Culture (routine x 2)     Status: Abnormal   Collection Time: 01/29/22  2:40 AM   Specimen: BLOOD LEFT HAND  Result Value Ref Range Status   Specimen Description BLOOD LEFT HAND  Final   Special Requests   Final    BOTTLES DRAWN AEROBIC ONLY Blood  Culture results may not be optimal due to an excessive volume of blood received in culture bottles   Culture  Setup Time   Final    GRAM POSITIVE COCCI IN CLUSTERS AEROBIC BOTTLE ONLY CRITICAL VALUE NOTED.  VALUE IS CONSISTENT WITH PREVIOUSLY REPORTED AND CALLED VALUE.    Culture (A)  Final    STAPHYLOCOCCUS AUREUS SUSCEPTIBILITIES PERFORMED ON PREVIOUS CULTURE WITHIN THE LAST 5 DAYS. Performed at [facility], [place] [address] [place], [place] [address]    Report Status 01/31/2022 FINAL  Final  Culture, blood (single)     Status: Abnormal   Collection Time: 01/29/22  5:56 AM   Specimen: BLOOD  Result Value Ref Range Status   Specimen Description BLOOD CENTRAL LINE  Final   Special Requests   Final    BOTTLES DRAWN AEROBIC AND ANAEROBIC Blood Culture adequate volume   Culture  Setup Time   Final    GRAM POSITIVE COCCI IN CLUSTERS IN BOTH AEROBIC AND ANAEROBIC BOTTLES CRITICAL VALUE NOTED.  VALUE IS CONSISTENT WITH PREVIOUSLY REPORTED AND CALLED VALUE.    Culture (A)  Final    STAPHYLOCOCCUS  AUREUS SUSCEPTIBILITIES PERFORMED ON PREVIOUS CULTURE WITHIN THE LAST 5 DAYS. Performed at [facility], [place] [address], [place], [place] [address]    Report Status 01/31/2022 FINAL  Final  Culture, blood (routine x 2)     Status: None   Collection Time: 01/30/22  4:32 AM   Specimen: BLOOD  Result Value Ref Range Status   Specimen Description BLOOD LEFT ANTECUBITAL  Final   Special Requests   Final    BOTTLES DRAWN AEROBIC AND ANAEROBIC Blood Culture adequate volume   Culture   Final    NO GROWTH 5 DAYS Performed at [facility], [place] [address], [place], [place] [address]    Report Status 02/04/2022 FINAL  Final  Culture, blood (routine x 2)     Status: Abnormal   Collection Time: 01/30/22  4:45 AM   Specimen: BLOOD  Result Value Ref Range Status   Specimen Description BLOOD BLOOD RIGHT WRIST  Final   Special Requests   Final    BOTTLES DRAWN AEROBIC AND ANAEROBIC Blood Culture adequate volume   Culture  Setup Time   Final    GRAM POSITIVE COCCI IN CLUSTERS ANAEROBIC BOTTLE ONLY CRITICAL VALUE NOTED.  VALUE IS CONSISTENT WITH PREVIOUSLY REPORTED AND CALLED VALUE.    Culture (A)  Final    STAPHYLOCOCCUS AUREUS SUSCEPTIBILITIES PERFORMED ON PREVIOUS CULTURE WITHIN THE LAST 5 DAYS. Performed at [facility], [place] [address], [place], [place] [address]    Report Status 02/05/2022 FINAL  Final  Culture, blood (routine x 2)     Status: None (Preliminary result)   Collection Time: 02/04/22  3:48 PM   Specimen: BLOOD  Result Value Ref Range Status   Specimen Description BLOOD LEFT ANTECUBITAL  Final   Special Requests   Final    BOTTLES DRAWN AEROBIC AND ANAEROBIC Blood Culture adequate volume   Culture   Final    NO GROWTH < 24 HOURS Performed at [facility], [place] [address] [place],  [address]    Report Status PENDING  Incomplete  Culture, blood (routine x 2)     Status: None (Preliminary result)   Collection Time: 02/04/22  3:56 PM    Specimen: BLOOD  Result Value Ref Range Status   Specimen Description BLOOD RIGHT ANTECUBITAL  Final   Special  Requests   Final    BOTTLES DRAWN AEROBIC AND ANAEROBIC Blood Culture adequate volume   Culture   Final    NO GROWTH < 24 HOURS Performed at [facility], [place] [address], [place], [place] [address]    Report Status PENDING  Incomplete    Pertinent Lab. CBC Latest Ref Rng & Units 02/04/2022 02/02/2022 02/01/2022  WBC 4.0 - 10.5 K/uL 11.9(H) 13.3(H) 11.7(H)  Hemoglobin 12.0 - 15.0 g/dL 9.5(L) 8.9(L) 9.2(L)  Hematocrit 36.0 - 46.0 % 29.6(L) 28.7(L) 29.2(L)  Platelets 150 - 400 K/uL 239 174 137(L)   CMP Latest Ref Rng & Units 02/04/2022 02/02/2022 02/01/2022  Glucose 70 - 99 mg/dL 112(H) 98 -  BUN 6 - 20 mg/dL 109(H) 42(H) -  Creatinine 0.44 - 1.00 mg/dL 7.97(H) 4.87(H) -  Sodium 135 - 145 mmol/L 134(L) 134(L) -  Potassium 3.5 - 5.1 mmol/L 4.5 3.9 5.1  Chloride 98 - 111 mmol/L 95(L) 95(L) -  CO2 22 - 32 mmol/L 23 25 -  Calcium 8.9 - 10.3 mg/dL 8.3(L) 7.5(L) -  Total Protein 6.5 - 8.1 g/dL - - -  Total Bilirubin 0.3 - 1.2 mg/dL - - -  Alkaline Phos 38 - 126 U/L - - -  AST 15 - 41 U/L - - -  ALT 0 - 44 U/L - - -     Pertinent Imaging today Plain films and CT images have been personally visualized and interpreted; radiology reports have been reviewed. Decision making incorporated into the Impression / Recommendations.  No results found.   I spent more than 35 minutes for this patient encounter including review of prior medical records, coordination of care with primary/other specialist with greater than 50% of time being face to face/counseling and discussing diagnostics/treatment plan with the patient/family.  Electronically signed by:   Rosiland Oz, MD Infectious Disease Physician [facility] Pager: [phone]

## 2022-02-06 NOTE — Progress Notes (Signed)
PROGRESS NOTE        PATIENT DETAILS Name: Cathy [name] Age: 41 y.o. Sex: female Date of Birth: 06/25/1981 Admit Date: 01/29/2022 Admitting Physician Christel Mormon, MD PCP:Pcp, No  Brief Summary: Patient is a 41 y.o.  female with history of ESRD, lupus, HTN, HLD-history of chronic under dialysis-numerous recent hospitalizations-presented with fever-found to have MRSA bacteremia.  TEE was negative for vegetations.  TDC was removed on 3/1-she was given a line holiday-TDC was subsequently replaced on 3/4.    Significant Hospital events: 1/13-1/17>> RLL pneumonia, large parapneumonic effusion, s/p chest tube and cefepime  1/26-1/30>> RML pneumonia, pericarditis/ effusion (? Lupus vs uremic), and lupus flare 2/5/-2/9>> hypertensive urgency, PE, EF down to 20-25% 3/1>> admit for fever-found to have MRSA bacteremia. 3/1>> tunneled HD catheter removed after dialysis. 3/4>> tunneled HD catheter placed by IR 3/6>> TEE: No vegetations. 3/7>> 1 out of 2 blood cultures drawn on 3/2 positive for MRSA.  Significant imaging studies: 3/1>> CXR: Small bilateral pleural effusions. 3/2>> Echo: No obvious vegetations, EF 35-40%.  Grade 3 diastolic dysfunction.  Trivial pericardial effusion. 3/6>> TEE  Significant microbiology data: 3/1>> COVID/influenza PCR: Positive 3/1>> blood culture: MRSA 3/2>> blood culture: 1/2 +ve MRSA 3/7>>blood culture: neg so far  Procedures: 3/1>> removal of tunneled dialysis catheter by IR. 3/4>> tunneled dialysis catheter placement by IR. 3/6>> TEE  Consults:  ID, IR, nephrology  Subjective: Very frustrated that she needs to remain hospitalized for a few more days to ensure that her cultures are negative for MRSA before she can be discharged.  Objective: Vitals: Blood pressure (!) 151/95, pulse 62, temperature 98 F (36.7 C), temperature source Temporal, resp. rate 20, height 5\' 7"  (1.702 m), weight 74.2 kg, SpO2 100 %, unknown if  currently breastfeeding.   Exam: Gen Exam:Alert awake-not in any distress HEENT:atraumatic, normocephalic Chest: B/L clear to auscultation anteriorly CVS:S1S2 regular Abdomen:soft non tender, non distended Extremities:no edema Neurology: Non focal Skin: no rash   Pertinent Labs/Radiology: CBC Latest Ref Rng & Units 02/06/2022 02/04/2022 02/02/2022  WBC 4.0 - 10.5 K/uL 14.5(H) 11.9(H) 13.3(H)  Hemoglobin 12.0 - 15.0 g/dL 9.4(L) 9.5(L) 8.9(L)  Hematocrit 36.0 - 46.0 % 30.9(L) 29.6(L) 28.7(L)  Platelets 150 - 400 K/uL 271 239 174    Lab Results  Component Value Date   NA 133 (L) 02/06/2022   K 4.8 02/06/2022   CL 94 (L) 02/06/2022   CO2 22 02/06/2022      Assessment/Plan: SIRS due to MRSA bacteremia: Overall clinically-foci of infection felt to be [facility] catheter which was removed on 3/1.  Repeat blood cultures on 3/2 were negative-line holiday was given until 3/4-when IR replaced TDC.  Unfortunately 1 out of 2 sets of blood cultures done on 3/2 grew out MRSA again (after 4 days on 3/7).  As discussed with IR on 3/7-existing TDC does not need to be removed, repeat blood cultures on 3/7 are negative so far.  Discussed with infectious disease MD-Dr.Manandhar today-she recommends continued inpatient monitoring for another 48 hours to ensure blood cultures drawn on 3/7 continue to stay negative before consideration of discharge or placement of aVF.  Continue vancomycin with HD.  ESRD on HD TTS: Nephrology following.  Longstanding history of under dialysis/noncompliance.  Vascular surgery was planning on AV fistula placement on 3/8-but given positive blood cultures-this has now been postponed.    Hyperkalemia:  Resolved after HD on 3/4.  Normocytic anemia: Due to CKD-defer Aranesp/iron therapy to nephrology.  Chronic combined systolic and diastolic heart failure: Trace lower extremity edema but otherwise stable.  Diuresis with HD.  Given echocardiogram findings concerning for infiltrative  cardiomyopathy-outpatient cardiology appointment has been scheduled for 3/16.  Pericardial effusion: Appears to be a chronic issue-suspect related to poor compliance to dialysis/under dialysis.  Echo on 3/2 showed small pericardial effusion.  Pulmonary embolism: Remains on Eliquis.  HTN: BP fluctuating-remains on the higher side-on Coreg, amlodipine, clonidine and hydralazine nephrology has added losartan.  History of lupus: Appears stable-continue Plaquenil and prednisone  Elevated point-of-care beta-hCG: Likely false positive elevation-Per patient-she has not had her menstrual period for close to a year-and she has not engage in sexual intercourse for "years".  Formal beta hCG not elevated.  Tobacco abuse: Encourage cessation-on transdermal nicotine.  Debility/deconditioning: Some of this is chronic-likely worsened by MRSA bacteremia.  Mobilize with rehab services and see how she does.  BMI: Estimated body mass index is 25.62 kg/m as calculated from the following:   Height as of this encounter: 5\' 7"  (1.702 m).   Weight as of this encounter: 74.2 kg.   Code status:   Code Status: Full Code   DVT Prophylaxis: apixaban (ELIQUIS) tablet 5 mg    Family Communication: Left voicemail for mother [phone] on 3/9  Disposition Plan: Status is: Inpatient Remains inpatient appropriate because: MRSA bacteremia-on IV vancomycin-unfortunately repeat cultures drawn on 3/2 were positive after 4 days on 3/7.  Repeat cultures again drawn on 3/7-will need to stay inpatient to ensure clearance of bacteremia before consideration of discharge.   Planned Discharge Destination:Home   Diet: Diet Order             Diet renal with fluid restriction Fluid restriction: 1200 mL Fluid; Room service appropriate? Yes; Fluid consistency: Thin  Diet effective now                     Antimicrobial agents: Anti-infectives (From admission, onward)    Start     Dose/Rate Route Frequency  Ordered Stop   02/05/22 0600  cefUROXime (ZINACEF) 1.5 g in sodium chloride 0.9 % 100 mL IVPB  Status:  Discontinued        1.5 g 200 mL/hr over 30 Minutes Intravenous On call to O.R. 02/04/22 0809 02/05/22 1332   02/04/22 1200  vancomycin (VANCOREADY) IVPB 750 mg/150 mL        750 mg 150 mL/hr over 60 Minutes Intravenous Every T-Th-Sa (Hemodialysis) 02/03/22 1348 03/01/22 1159   02/01/22 1433  vancomycin (VANCOREADY) IVPB 750 mg/150 mL        750 mg 150 mL/hr over 60 Minutes Intravenous Every Dialysis 02/01/22 1435 02/01/22 1928   02/01/22 0000  ceFAZolin (ANCEF) IVPB 2g/100 mL premix       Note to Pharmacy: Pls contact [place] or Jannifer Franklin if you have question over the weekend.   2 g 200 mL/hr over 30 Minutes Intravenous To Radiology 01/31/22 1555 02/01/22 1607   01/30/22 2200  ceFEPIme (MAXIPIME) 1 g in sodium chloride 0.9 % 100 mL IVPB  Status:  Discontinued        1 g 200 mL/hr over 30 Minutes Intravenous Every 24 hours 01/29/22 0606 01/29/22 1453   01/30/22 1200  vancomycin (VANCOCIN) 750 mg in sodium chloride 0.9 % 250 mL IVPB  Status:  Discontinued        750 mg 250 mL/hr over 60 Minutes  Intravenous Every T-Th-Sa (Hemodialysis) 01/29/22 0606 01/30/22 0806   01/30/22 1000  hydroxychloroquine (PLAQUENIL) tablet 100 mg        100 mg Oral Every 48 hours 01/29/22 0904     01/30/22 0900  vancomycin (VANCOREADY) IVPB 750 mg/150 mL        750 mg 150 mL/hr over 60 Minutes Intravenous  Once 01/30/22 0806 01/30/22 1056   01/30/22 0806  vancomycin variable dose per unstable renal function (pharmacist dosing)  Status:  Discontinued         Does not apply See admin instructions 01/30/22 0806 02/03/22 1348   01/29/22 0615  ceFEPIme (MAXIPIME) 2 g in sodium chloride 0.9 % 100 mL IVPB        2 g 200 mL/hr over 30 Minutes Intravenous  Once 01/29/22 0600 01/29/22 0722   01/29/22 0615  metroNIDAZOLE (FLAGYL) IVPB 500 mg        500 mg 100 mL/hr over 60 Minutes Intravenous  Once 01/29/22  0600 01/29/22 0903   01/29/22 0615  vancomycin (VANCOCIN) IVPB 1000 mg/200 mL premix  Status:  Discontinued        1,000 mg 200 mL/hr over 60 Minutes Intravenous  Once 01/29/22 0600 01/29/22 0604   01/29/22 0615  Vancomycin (VANCOCIN) 1,250 mg in sodium chloride 0.9 % 250 mL IVPB        1,250 mg 166.7 mL/hr over 90 Minutes Intravenous  Once 01/29/22 0604 01/29/22 0934        MEDICATIONS: Scheduled Meds:  amLODipine  10 mg Oral Daily   apixaban  5 mg Oral BID   calcium acetate  1,334 mg Oral TID WC   carvedilol  25 mg Oral BID WC   Chlorhexidine Gluconate Cloth  6 each Topical Q0600   cloNIDine  0.3 mg Oral TID   colchicine  0.3 mg Oral Daily   darbepoetin (ARANESP) injection - DIALYSIS  200 mcg Intravenous Q Tue-HD   feeding supplement (NEPRO CARB STEADY)  237 mL Oral BID BM   hydrALAZINE  100 mg Oral TID   hydroxychloroquine  100 mg Oral Q48H   losartan  50 mg Oral QHS   nicotine  14 mg Transdermal Daily   predniSONE  40 mg Oral Q breakfast   sodium chloride flush  3 mL Intravenous Q12H   sodium zirconium cyclosilicate  10 g Oral Daily   Continuous Infusions:  sodium chloride     sodium chloride     vancomycin Stopped (02/04/22 1435)   PRN Meds:.sodium chloride, sodium chloride, acetaminophen **OR** acetaminophen, albuterol, alteplase, calcium carbonate (dosed in mg elemental calcium), docusate sodium, feeding supplement (NEPRO CARB STEADY), heparin, HYDROmorphone (DILAUDID) injection, hydrOXYzine, lidocaine (PF), lidocaine-prilocaine, methocarbamol, ondansetron **OR** ondansetron (ZOFRAN) IV, oxyCODONE, pentafluoroprop-tetrafluoroeth, phenol, sorbitol, zolpidem   I have personally reviewed following labs and imaging studies  LABORATORY DATA: CBC: Recent Labs  Lab 01/31/22 0202 02/01/22 0100 02/02/22 0203 02/04/22 1000 02/06/22 1335  WBC 13.2* 11.7* 13.3* 11.9* 14.5*  NEUTROABS  --   --   --  10.7*  --   HGB 9.3* 9.2* 8.9* 9.5* 9.4*  HCT 29.8* 29.2* 28.7* 29.6*  30.9*  MCV 91.7 91.8 90.8 91.4 93.4  PLT 136* 137* 174 239 271     Basic Metabolic Panel: Recent Labs  Lab 01/31/22 0202 02/01/22 0100 02/01/22 1121 02/02/22 0203 02/04/22 1030 02/06/22 1335  NA 130* 130*  --  134* 134* 133*  K 4.9 5.7* 5.1 3.9 4.5 4.8  CL 93* 93*  --  95*  95* 94*  CO2 23 21*  --  25 23 22   GLUCOSE 118* 104*  --  98 112* 128*  BUN 58* 82*  --  42* 109* 109*  CREATININE 6.91* 7.71*  --  4.87* 7.97* 7.47*  CALCIUM 7.7* 8.1*  --  7.5* 8.3* 8.7*  PHOS 5.1* 4.0  --  3.2 4.1 3.1     GFR: Estimated Creatinine Clearance: 10.7 mL/min (A) (by C-G formula based on SCr of 7.47 mg/dL (H)).  Liver Function Tests: Recent Labs  Lab 01/31/22 0202 02/01/22 0100 02/02/22 0203 02/04/22 1030 02/06/22 1335  ALBUMIN 2.5* 2.6* 2.4* 2.6* 2.6*    No results for input(s): LIPASE, AMYLASE in the last 168 hours. No results for input(s): AMMONIA in the last 168 hours.  Coagulation Profile: No results for input(s): INR, PROTIME in the last 168 hours.   Cardiac Enzymes: No results for input(s): CKTOTAL, CKMB, CKMBINDEX, TROPONINI in the last 168 hours.  BNP (last 3 results) No results for input(s): PROBNP in the last 8760 hours.  Lipid Profile: No results for input(s): CHOL, HDL, LDLCALC, TRIG, CHOLHDL, LDLDIRECT in the last 72 hours.  Thyroid Function Tests: No results for input(s): TSH, T4TOTAL, FREET4, T3FREE, THYROIDAB in the last 72 hours.  Anemia Panel: No results for input(s): VITAMINB12, FOLATE, FERRITIN, TIBC, IRON, RETICCTPCT in the last 72 hours.  Urine analysis:    Component Value Date/Time   COLORURINE YELLOW 11/29/2020 2054   APPEARANCEUR HAZY (A) 11/29/2020 2054   LABSPEC 1.015 11/29/2020 2054   PHURINE 6.0 11/29/2020 2054   GLUCOSEU NEGATIVE 11/29/2020 2054   HGBUR MODERATE (A) 11/29/2020 2054   BILIRUBINUR NEGATIVE 11/29/2020 2054   KETONESUR NEGATIVE 11/29/2020 2054   PROTEINUR >=300 (A) 11/29/2020 2054   UROBILINOGEN 0.2 10/17/2020 1026    NITRITE NEGATIVE 11/29/2020 2054   LEUKOCYTESUR NEGATIVE 11/29/2020 2054    Sepsis Labs: Lactic Acid, Venous    Component Value Date/Time   LATICACIDVEN 3.1 (HH) 01/29/2022 0748    MICROBIOLOGY: Recent Results (from the past 240 hour(s))  Resp Panel by RT-PCR (Flu A&B, Covid) Nasopharyngeal Swab     Status: None   Collection Time: 01/29/22  2:18 AM   Specimen: Nasopharyngeal Swab; Nasopharyngeal(NP) swabs in vial transport medium  Result Value Ref Range Status   SARS Coronavirus 2 by RT PCR NEGATIVE NEGATIVE Final    Comment: (NOTE) SARS-CoV-2 target nucleic acids are NOT DETECTED.  The SARS-CoV-2 RNA is generally detectable in upper respiratory specimens during the acute phase of infection. The lowest concentration of SARS-CoV-2 viral copies this assay can detect is 138 copies/mL. A negative result does not preclude SARS-Cov-2 infection and should not be used as the sole basis for treatment or other patient management decisions. A negative result may occur with  improper specimen collection/handling, submission of specimen other than nasopharyngeal swab, presence of viral mutation(s) within the areas targeted by this assay, and inadequate number of viral copies(<138 copies/mL). A negative result must be combined with clinical observations, patient history, and epidemiological information. The expected result is Negative.  Fact Sheet for Patients:  EntrepreneurPulse.com.au  Fact Sheet for Healthcare Providers:  IncredibleEmployment.be  This test is no t yet approved or cleared by the [place] FDA and  has been authorized for detection and/or diagnosis of SARS-CoV-2 by FDA under an Emergency Use Authorization (EUA). This EUA will remain  in effect (meaning this test can be used) for the duration of the COVID-19 declaration under Section 564(b)(1) of the Act, 21 U.S.C.section 360bbb-3(b)(1),  unless the authorization is terminated   or revoked sooner.       Influenza A by PCR NEGATIVE NEGATIVE Final   Influenza B by PCR NEGATIVE NEGATIVE Final    Comment: (NOTE) The Xpert Xpress SARS-CoV-2/FLU/RSV plus assay is intended as an aid in the diagnosis of influenza from Nasopharyngeal swab specimens and should not be used as a sole basis for treatment. Nasal washings and aspirates are unacceptable for Xpert Xpress SARS-CoV-2/FLU/RSV testing.  Fact Sheet for Patients: EntrepreneurPulse.com.au  Fact Sheet for Healthcare Providers: IncredibleEmployment.be  This test is not yet approved or cleared by the [place] FDA and has been authorized for detection and/or diagnosis of SARS-CoV-2 by FDA under an Emergency Use Authorization (EUA). This EUA will remain in effect (meaning this test can be used) for the duration of the COVID-19 declaration under Section 564(b)(1) of the Act, 21 U.S.C. section 360bbb-3(b)(1), unless the authorization is terminated or revoked.  Performed at [facility], [place] [address] [place], [place] [address]   Blood Culture (routine x 2)     Status: Abnormal   Collection Time: 01/29/22  2:30 AM   Specimen: BLOOD LEFT ARM  Result Value Ref Range Status   Specimen Description BLOOD LEFT ARM  Final   Special Requests   Final    BOTTLES DRAWN AEROBIC AND ANAEROBIC Blood Culture results may not be optimal due to an excessive volume of blood received in culture bottles   Culture  Setup Time   Final    GRAM POSITIVE COCCI IN CLUSTERS IN BOTH AEROBIC AND ANAEROBIC BOTTLES CRITICAL RESULT CALLED TO, READ BACK BY AND VERIFIED WITH: PHARMD M.WEISKOPH AT 5400 ON 01/29/2022 BY T.SAAD. Performed at [facility], [place] [address] [place], [place] [address]    Culture METHICILLIN RESISTANT STAPHYLOCOCCUS AUREUS (A)  Final   Report Status 01/31/2022 FINAL  Final   Organism ID, Bacteria METHICILLIN RESISTANT STAPHYLOCOCCUS AUREUS  Final       Susceptibility   Methicillin resistant staphylococcus aureus - MIC*    CIPROFLOXACIN <=0.5 SENSITIVE Sensitive     ERYTHROMYCIN >=8 RESISTANT Resistant     GENTAMICIN <=0.5 SENSITIVE Sensitive     OXACILLIN >=4 RESISTANT Resistant     TETRACYCLINE <=1 SENSITIVE Sensitive     VANCOMYCIN 1 SENSITIVE Sensitive     TRIMETH/SULFA <=10 SENSITIVE Sensitive     CLINDAMYCIN <=0.25 SENSITIVE Sensitive     RIFAMPIN <=0.5 SENSITIVE Sensitive     Inducible Clindamycin NEGATIVE Sensitive     * METHICILLIN RESISTANT STAPHYLOCOCCUS AUREUS  Blood Culture ID Panel (Reflexed)     Status: Abnormal   Collection Time: 01/29/22  2:30 AM  Result Value Ref Range Status   Enterococcus faecalis NOT DETECTED NOT DETECTED Final   Enterococcus Faecium NOT DETECTED NOT DETECTED Final   Listeria monocytogenes NOT DETECTED NOT DETECTED Final   Staphylococcus species DETECTED (A) NOT DETECTED Final    Comment: CRITICAL RESULT CALLED TO, READ BACK BY AND VERIFIED WITH: PHARMD M.WEISKOPH AT 1449 ON 01/29/2022 BY T.SAAD.    Staphylococcus aureus (BCID) DETECTED (A) NOT DETECTED Final    Comment: Methicillin (oxacillin)-resistant Staphylococcus aureus (MRSA). MRSA is predictably resistant to beta-lactam antibiotics (except ceftaroline). Preferred therapy is vancomycin unless clinically contraindicated. Patient requires contact precautions if  hospitalized. CRITICAL RESULT CALLED TO, READ BACK BY AND VERIFIED WITH: PHARMD M.WEISKOPH AT 1449 ON 01/29/2022 BY T.SAAD.    Staphylococcus epidermidis NOT DETECTED NOT DETECTED Final   Staphylococcus lugdunensis NOT DETECTED NOT DETECTED Final  Streptococcus species NOT DETECTED NOT DETECTED Final   Streptococcus agalactiae NOT DETECTED NOT DETECTED Final   Streptococcus pneumoniae NOT DETECTED NOT DETECTED Final   Streptococcus pyogenes NOT DETECTED NOT DETECTED Final   A.calcoaceticus-baumannii NOT DETECTED NOT DETECTED Final   Bacteroides fragilis NOT DETECTED NOT DETECTED  Final   Enterobacterales NOT DETECTED NOT DETECTED Final   Enterobacter cloacae complex NOT DETECTED NOT DETECTED Final   Escherichia coli NOT DETECTED NOT DETECTED Final   Klebsiella aerogenes NOT DETECTED NOT DETECTED Final   Klebsiella oxytoca NOT DETECTED NOT DETECTED Final   Klebsiella pneumoniae NOT DETECTED NOT DETECTED Final   Proteus species NOT DETECTED NOT DETECTED Final   Salmonella species NOT DETECTED NOT DETECTED Final   Serratia marcescens NOT DETECTED NOT DETECTED Final   Haemophilus influenzae NOT DETECTED NOT DETECTED Final   Neisseria meningitidis NOT DETECTED NOT DETECTED Final   Pseudomonas aeruginosa NOT DETECTED NOT DETECTED Final   Stenotrophomonas maltophilia NOT DETECTED NOT DETECTED Final   Candida albicans NOT DETECTED NOT DETECTED Final   Candida auris NOT DETECTED NOT DETECTED Final   Candida glabrata NOT DETECTED NOT DETECTED Final   Candida krusei NOT DETECTED NOT DETECTED Final   Candida parapsilosis NOT DETECTED NOT DETECTED Final   Candida tropicalis NOT DETECTED NOT DETECTED Final   Cryptococcus neoformans/gattii NOT DETECTED NOT DETECTED Final   Meth resistant mecA/C and MREJ DETECTED (A) NOT DETECTED Final    Comment: CRITICAL RESULT CALLED TO, READ BACK BY AND VERIFIED WITH: PHARMD M.WEISKOPH AT 1449 ON 01/29/2022 BY T.SAAD. Performed at [facility], [place] [address], [place], [place] [address]   Blood Culture (routine x 2)     Status: Abnormal   Collection Time: 01/29/22  2:40 AM   Specimen: BLOOD LEFT HAND  Result Value Ref Range Status   Specimen Description BLOOD LEFT HAND  Final   Special Requests   Final    BOTTLES DRAWN AEROBIC ONLY Blood Culture results may not be optimal due to an excessive volume of blood received in culture bottles   Culture  Setup Time   Final    GRAM POSITIVE COCCI IN CLUSTERS AEROBIC BOTTLE ONLY CRITICAL VALUE NOTED.  VALUE IS CONSISTENT WITH PREVIOUSLY REPORTED AND CALLED VALUE.    Culture (A)  Final     STAPHYLOCOCCUS AUREUS SUSCEPTIBILITIES PERFORMED ON PREVIOUS CULTURE WITHIN THE LAST 5 DAYS. Performed at [facility], [place] [address], [place], [place] [address]    Report Status 01/31/2022 FINAL  Final  Culture, blood (single)     Status: Abnormal   Collection Time: 01/29/22  5:56 AM   Specimen: BLOOD  Result Value Ref Range Status   Specimen Description BLOOD CENTRAL LINE  Final   Special Requests   Final    BOTTLES DRAWN AEROBIC AND ANAEROBIC Blood Culture adequate volume   Culture  Setup Time   Final    GRAM POSITIVE COCCI IN CLUSTERS IN BOTH AEROBIC AND ANAEROBIC BOTTLES CRITICAL VALUE NOTED.  VALUE IS CONSISTENT WITH PREVIOUSLY REPORTED AND CALLED VALUE.    Culture (A)  Final    STAPHYLOCOCCUS AUREUS SUSCEPTIBILITIES PERFORMED ON PREVIOUS CULTURE WITHIN THE LAST 5 DAYS. Performed at [facility], [place] [address] [place], [place] [address]    Report Status 01/31/2022 FINAL  Final  Culture, blood (routine x 2)     Status: None   Collection Time: 01/30/22  4:32 AM   Specimen: BLOOD  Result Value Ref Range Status   Specimen  Description BLOOD LEFT ANTECUBITAL  Final   Special Requests   Final    BOTTLES DRAWN AEROBIC AND ANAEROBIC Blood Culture adequate volume   Culture   Final    NO GROWTH 5 DAYS Performed at [facility], [address], [place], [place] [address]    Report Status 02/04/2022 FINAL  Final  Culture, blood (routine x 2)     Status: Abnormal   Collection Time: 01/30/22  4:45 AM   Specimen: BLOOD  Result Value Ref Range Status   Specimen Description BLOOD BLOOD RIGHT WRIST  Final   Special Requests   Final    BOTTLES DRAWN AEROBIC AND ANAEROBIC Blood Culture adequate volume   Culture  Setup Time   Final    GRAM POSITIVE COCCI IN CLUSTERS ANAEROBIC BOTTLE ONLY CRITICAL VALUE NOTED.  VALUE IS CONSISTENT WITH PREVIOUSLY REPORTED AND CALLED VALUE.    Culture (A)  Final    STAPHYLOCOCCUS AUREUS SUSCEPTIBILITIES PERFORMED ON PREVIOUS  CULTURE WITHIN THE LAST 5 DAYS. Performed at [facility], [place] [address], [place], [place] [address]    Report Status 02/05/2022 FINAL  Final  Culture, blood (routine x 2)     Status: None (Preliminary result)   Collection Time: 02/04/22  3:48 PM   Specimen: BLOOD  Result Value Ref Range Status   Specimen Description BLOOD LEFT ANTECUBITAL  Final   Special Requests   Final    BOTTLES DRAWN AEROBIC AND ANAEROBIC Blood Culture adequate volume   Culture   Final    NO GROWTH 2 DAYS Performed at [facility], [place] [address], [place], [place] [address]    Report Status PENDING  Incomplete  Culture, blood (routine x 2)     Status: None (Preliminary result)   Collection Time: 02/04/22  3:56 PM   Specimen: BLOOD  Result Value Ref Range Status   Specimen Description BLOOD RIGHT ANTECUBITAL  Final   Special Requests   Final    BOTTLES DRAWN AEROBIC AND ANAEROBIC Blood Culture adequate volume   Culture   Final    NO GROWTH 2 DAYS Performed at [facility], [place] [address] [place], [place] [address]    Report Status PENDING  Incomplete    RADIOLOGY STUDIES/RESULTS: No results found.   LOS: 8 days   Oren Binet, MD  [facility]    To contact the attending provider between 7A-7P or the covering provider during after hours 7P-7A, please log into the web site [email or web address] and access using universal [place] password for that web site. If you do not have the password, please call the hospital operator.  02/06/2022, 2:14 PM

## 2022-02-06 NOTE — Progress Notes (Signed)
?   02/06/22 1038  ?Vitals  ?BP (!) 191/116  ?MEWS COLOR  ?MEWS Score Color Green  ?MEWS Score  ?MEWS Temp 0  ?MEWS Systolic 0  ?MEWS Pulse 0  ?MEWS RR 0  ?MEWS LOC 0  ?MEWS Score 0  ? ?Patient in 7/10 pain, blood p[ressure medications given, MD aware. Will continue to monitor. ? ?Haydee Salter, RN ? ?

## 2022-02-06 NOTE — Progress Notes (Signed)
?[facility] ?Progress Note  ? ?Subjective: Wants to go home. She has 5 children, 4 of which are staying with relatives which is causing increased personal stress. Awaiting results of repeat [facility] 03/07 which so far are NG X 2 days. HD today on schedule.  ? ?Objective ?Vitals:  ? 02/06/22 0001 02/06/22 0410 02/06/22 0744 02/06/22 1038  ?BP: (!) 166/105 (!) 168/104 (!) 192/113 (!) 191/116  ?Pulse: 64 62 65   ?Resp: 19 18 20    ?Temp: 98 ?F (36.7 ?C) 98.4 ?F (36.9 ?C) 98 ?F (36.7 ?C)   ?TempSrc: Oral Oral Oral   ?SpO2: 100% 100%    ?Weight:      ?Height:      ? ?Physical Exam ?General: Pleasant, chronically ill appearing female in NAD ?Heart: S1,S2 RRR No M/R/G. SR on monitor.  ?Lungs: CTAB ?Abdomen: Distended with probable ascites present. NABS.  ?Extremities: No LE edema ?Dialysis Access: [facility] drsg CDI.  ? ? ? ?Additional Objective ?Labs: ?Basic Metabolic Panel: ?Recent Labs  ?Lab 02/01/22 ?0100 02/01/22 ?1121 02/02/22 ?0203 02/04/22 ?1030  ?NA 130*  --  134* 134*  ?K 5.7* 5.1 3.9 4.5  ?CL 93*  --  95* 95*  ?CO2 21*  --  25 23  ?GLUCOSE 104*  --  98 112*  ?BUN 82*  --  42* 109*  ?CREATININE 7.71*  --  4.87* 7.97*  ?CALCIUM 8.1*  --  7.5* 8.3*  ?PHOS 4.0  --  3.2 4.1  ? ?Liver Function Tests: ?Recent Labs  ?Lab 02/01/22 ?0100 02/02/22 ?0203 02/04/22 ?1030  ?ALBUMIN 2.6* 2.4* 2.6*  ? ?No results for input(s): LIPASE, AMYLASE in the last 168 hours. ?CBC: ?Recent Labs  ?Lab 01/31/22 ?0202 02/01/22 ?0100 02/02/22 ?0203 02/04/22 ?1000  ?WBC 13.2* 11.7* 13.3* 11.9*  ?NEUTROABS  --   --   --  10.7*  ?HGB 9.3* 9.2* 8.9* 9.5*  ?HCT 29.8* 29.2* 28.7* 29.6*  ?MCV 91.7 91.8 90.8 91.4  ?PLT 136* 137* 174 239  ? ?Blood Culture ?   ?Component Value Date/Time  ? SDES BLOOD RIGHT ANTECUBITAL 02/04/2022 1556  ? SPECREQUEST  02/04/2022 1556  ?  BOTTLES DRAWN AEROBIC AND ANAEROBIC Blood Culture adequate volume  ? CULT  02/04/2022 1556  ?  NO GROWTH 2 DAYS ?Performed at [facility], [place] [address], [place],  [place] [address] ?  ? REPTSTATUS PENDING 02/04/2022 1556  ? ? ?Cardiac Enzymes: ?No results for input(s): CKTOTAL, CKMB, CKMBINDEX, TROPONINI in the last 168 hours. ?CBG: ?No results for input(s): GLUCAP in the last 168 hours. ?Iron Studies: No results for input(s): IRON, TIBC, TRANSFERRIN, FERRITIN in the last 72 hours. ?@lablastinr3 @ ?Studies/Results: ?No results found. ?Medications: ? sodium chloride    ? sodium chloride    ? vancomycin Stopped (02/04/22 1435)  ? ? amLODipine  10 mg Oral Daily  ? apixaban  5 mg Oral BID  ? calcium acetate  1,334 mg Oral TID WC  ? carvedilol  25 mg Oral BID WC  ? Chlorhexidine Gluconate Cloth  6 each Topical Q0600  ? cloNIDine  0.3 mg Oral TID  ? colchicine  0.3 mg Oral Daily  ? darbepoetin (ARANESP) injection - DIALYSIS  200 mcg Intravenous Q Tue-HD  ? feeding supplement (NEPRO CARB STEADY)  237 mL Oral BID BM  ? hydrALAZINE  100 mg Oral TID  ? hydroxychloroquine  100 mg Oral Q48H  ? nicotine  14 mg Transdermal Daily  ? predniSONE  40 mg Oral Q  breakfast  ? sodium chloride flush  3 mL Intravenous Q12H  ? sodium zirconium cyclosilicate  10 g Oral Daily  ? ? ? ?Dialysis Orders: ?Dialyzes at [facility] TTS 4 hrs EDW 52 kg ?2K/ 2 Ca bath ?BFR 400 via TDC, DFR 800 ?Hectorol 3 q rx ?Venofer 100 q rx x 10, ending 02/08/22 ?Mircera 225 q 2 weeks, last given 01/25/22 ?  ?Assessment/Plan: ?1 SIRS due to MRSA bacteremia sepsis- S/p R TDC 3/4 after line holiday. S/p TEE 3/6-no evidence of vegetation. Continue Vanc. Noted repeat BC from 3/2 (+) staph aureus. IR said that there is no biofilm yet w/ a new TDC so it dose not need to be removed. [facility] 02/04/2022 NGTD X 2 days. Appreciate ID and IR assistance.  ?2 ESRD: TTS HD. Plan for HD 3/9.  ?3. AV Access: Seen by [facility] who had planned for placement of AVF vs AVG, however this has been canceled per request of ID. Can have permanent access placed as OP.  ?4 Hypertension/volume-Very hypertensive, medications reviewed. Start losartan 50 mg PO q HS. Disc with pt.  Continue other medications.Very much above OP EDW with ascites present. May need paracentesis. Continue to lower volume as tolerated.   ?4. 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 Hold Venofer  with bacteremia  ?5.  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 Attempt to optimize volume with HD. Adhere to fluid restrictions.   ?6.  Lupus: on pred and plaquenil ?7.  Nutrition: albumin 2 4 add protein supplement to diet ?  ? ?Jett Kulzer H. Falisha Osment NP-C ?02/06/2022, 11:26 AM  ?[facility] ?[phone] ? ? ?  ? ?

## 2022-02-07 ENCOUNTER — Inpatient Hospital Stay (HOSPITAL_COMMUNITY): Payer: Medicare Other

## 2022-02-07 NOTE — Progress Notes (Signed)
Pt off unit for procedure at this time. ?

## 2022-02-07 NOTE — Progress Notes (Signed)
PROGRESS NOTE        PATIENT DETAILS Name: Cathy [name] Age: 41 y.o. Sex: female Date of Birth: 1981/05/12 Admit Date: 01/29/2022 Admitting Physician Christel Mormon, MD PCP:Pcp, No  Brief Summary: Patient is a 41 y.o.  female with history of ESRD, lupus, HTN, HLD-history of chronic under dialysis-numerous recent hospitalizations-presented with fever-found to have MRSA bacteremia.  TEE was negative for vegetations.  TDC was removed on 3/1-she was given a line holiday-TDC was subsequently replaced on 3/4.    Significant Hospital events: 1/13-1/17>> RLL pneumonia, large parapneumonic effusion, s/p chest tube and cefepime  1/26-1/30>> RML pneumonia, pericarditis/ effusion (? Lupus vs uremic), and lupus flare 2/5/-2/9>> hypertensive urgency, PE, EF down to 20-25% 3/1>> admit for fever-found to have MRSA bacteremia. 3/1>> tunneled HD catheter removed after dialysis. 3/4>> tunneled HD catheter placed by IR 3/6>> TEE: No vegetations. 3/7>> 1 out of 2 blood cultures drawn on 3/2 positive for MRSA.  Significant imaging studies: 3/1>> CXR: Small bilateral pleural effusions. 3/2>> Echo: No obvious vegetations, EF 35-40%.  Grade 3 diastolic dysfunction.  Trivial pericardial effusion. 3/6>> TEE  Significant microbiology data: 3/1>> COVID/influenza PCR: Positive 3/1>> blood culture: MRSA 3/2>> blood culture: 1/2 +ve MRSA 3/7>>blood culture: neg so far  Procedures: 3/1>> removal of tunneled dialysis catheter by IR. 3/4>> tunneled dialysis catheter placement by IR. 3/6>> TEE  Consults:  ID, IR, nephrology  Subjective: Awake/alert-much better-she now understands why she needs to stay in the hospital till tomorrow at the least.  Objective: Vitals: Blood pressure (!) 151/97, pulse 64, temperature 98.1 F (36.7 C), temperature source Oral, resp. rate 16, height 5\' 7"  (1.702 m), weight 70.5 kg, SpO2 98 %, unknown if currently breastfeeding.   Exam: Gen  Exam:Alert awake-not in any distress HEENT:atraumatic, normocephalic Chest: B/L clear to auscultation anteriorly CVS:S1S2 regular Abdomen:soft non tender, non distended Extremities:no edema Neurology: Non focal Skin: no rash   Pertinent Labs/Radiology: CBC Latest Ref Rng & Units 02/06/2022 02/04/2022 02/02/2022  WBC 4.0 - 10.5 K/uL 14.5(H) 11.9(H) 13.3(H)  Hemoglobin 12.0 - 15.0 g/dL 9.4(L) 9.5(L) 8.9(L)  Hematocrit 36.0 - 46.0 % 30.9(L) 29.6(L) 28.7(L)  Platelets 150 - 400 K/uL 271 239 174    Lab Results  Component Value Date   NA 133 (L) 02/06/2022   K 4.8 02/06/2022   CL 94 (L) 02/06/2022   CO2 22 02/06/2022      Assessment/Plan: SIRS due to MRSA bacteremia: Overall clinically-foci of infection felt to be [facility] catheter which was removed on 3/1.  Repeat blood cultures on 3/2 were negative-line holiday was given until 3/4-when IR replaced TDC.  Unfortunately 1 out of 2 sets of blood cultures done on 3/2 grew out MRSA again (after 4 days on 3/7).  As discussed with IR on 3/7-existing TDC does not need to be removed, repeat blood cultures on 3/7 are negative so far.  Discussed with infectious disease MD-Dr.Manandhar on 3/9-she recommends continued inpatient monitoring at least until 3/11 to ensure blood cultures drawn on 2/7 continue to stay negative before consideration of discharge and placement of aVF. Continue vancomycin with HD.  ESRD on HD TTS: Nephrology following.  Longstanding history of under dialysis/noncompliance.  Vascular surgery was planning on AV fistula placement on 3/8-but given positive blood cultures-this has now been postponed.  As some mild abdominal distention-likely ascites-nephrology contemplating paracentesis.  Hyperkalemia: Resolved after HD  on 3/4.  Normocytic anemia: Due to CKD-defer Aranesp/iron therapy to nephrology.  Chronic combined systolic and diastolic heart failure: Trace lower extremity edema but otherwise stable.  Diuresis with HD.  Given  echocardiogram findings concerning for infiltrative cardiomyopathy-outpatient cardiology appointment has been scheduled for 3/16.  Pericardial effusion: Appears to be a chronic issue-suspect related to poor compliance to dialysis/under dialysis.  Echo on 3/2 showed small pericardial effusion.  Pulmonary embolism: Remains on Eliquis.  HTN: BP better controlled-continue losartan, Coreg, amlodipine, clonidine and hydralazine   History of lupus: Appears stable-continue Plaquenil and prednisone  Elevated point-of-care beta-hCG: Likely false positive elevation-Per patient-she has not had her menstrual period for close to a year-and she has not engage in sexual intercourse for "years".  Formal beta hCG not elevated.  Tobacco abuse: Encourage cessation-on transdermal nicotine.  Debility/deconditioning: Some of this is chronic-likely worsened by MRSA bacteremia.  Mobilize with rehab services and see how she does.  BMI: Estimated body mass index is 24.34 kg/m as calculated from the following:   Height as of this encounter: 5\' 7"  (1.702 m).   Weight as of this encounter: 70.5 kg.   Code status:   Code Status: Full Code   DVT Prophylaxis: apixaban (ELIQUIS) tablet 5 mg    Family Communication: Left voicemail for mother [phone] on 3/9  Disposition Plan: Status is: Inpatient Remains inpatient appropriate because: MRSA bacteremia-on IV vancomycin-unfortunately repeat cultures drawn on 3/2 were positive after 4 days on 3/7.  Repeat cultures again drawn on 3/7-will need to stay inpatient to ensure clearance of bacteremia before consideration of discharge.   Planned Discharge Destination:Home once cleared by infectious disease-probably on 3/11 if cultures done on 3/7 continue to stay negative.   Diet: Diet Order             Diet renal with fluid restriction Fluid restriction: 1200 mL Fluid; Room service appropriate? Yes; Fluid consistency: Thin  Diet effective now                      Antimicrobial agents: Anti-infectives (From admission, onward)    Start     Dose/Rate Route Frequency Ordered Stop   02/05/22 0600  cefUROXime (ZINACEF) 1.5 g in sodium chloride 0.9 % 100 mL IVPB  Status:  Discontinued        1.5 g 200 mL/hr over 30 Minutes Intravenous On call to O.R. 02/04/22 0809 02/05/22 1332   02/04/22 1200  vancomycin (VANCOREADY) IVPB 750 mg/150 mL        750 mg 150 mL/hr over 60 Minutes Intravenous Every T-Th-Sa (Hemodialysis) 02/03/22 1348 03/01/22 1159   02/01/22 1433  vancomycin (VANCOREADY) IVPB 750 mg/150 mL        750 mg 150 mL/hr over 60 Minutes Intravenous Every Dialysis 02/01/22 1435 02/01/22 1928   02/01/22 0000  ceFAZolin (ANCEF) IVPB 2g/100 mL premix       Note to Pharmacy: Pls contact [place] or Jannifer Franklin if you have question over the weekend.   2 g 200 mL/hr over 30 Minutes Intravenous To Radiology 01/31/22 1555 02/01/22 1607   01/30/22 2200  ceFEPIme (MAXIPIME) 1 g in sodium chloride 0.9 % 100 mL IVPB  Status:  Discontinued        1 g 200 mL/hr over 30 Minutes Intravenous Every 24 hours 01/29/22 0606 01/29/22 1453   01/30/22 1200  vancomycin (VANCOCIN) 750 mg in sodium chloride 0.9 % 250 mL IVPB  Status:  Discontinued  750 mg 250 mL/hr over 60 Minutes Intravenous Every T-Th-Sa (Hemodialysis) 01/29/22 0606 01/30/22 0806   01/30/22 1000  hydroxychloroquine (PLAQUENIL) tablet 100 mg        100 mg Oral Every 48 hours 01/29/22 0904     01/30/22 0900  vancomycin (VANCOREADY) IVPB 750 mg/150 mL        750 mg 150 mL/hr over 60 Minutes Intravenous  Once 01/30/22 0806 01/30/22 1056   01/30/22 0806  vancomycin variable dose per unstable renal function (pharmacist dosing)  Status:  Discontinued         Does not apply See admin instructions 01/30/22 0806 02/03/22 1348   01/29/22 0615  ceFEPIme (MAXIPIME) 2 g in sodium chloride 0.9 % 100 mL IVPB        2 g 200 mL/hr over 30 Minutes Intravenous  Once 01/29/22 0600 01/29/22 0722    01/29/22 0615  metroNIDAZOLE (FLAGYL) IVPB 500 mg        500 mg 100 mL/hr over 60 Minutes Intravenous  Once 01/29/22 0600 01/29/22 0903   01/29/22 0615  vancomycin (VANCOCIN) IVPB 1000 mg/200 mL premix  Status:  Discontinued        1,000 mg 200 mL/hr over 60 Minutes Intravenous  Once 01/29/22 0600 01/29/22 0604   01/29/22 0615  Vancomycin (VANCOCIN) 1,250 mg in sodium chloride 0.9 % 250 mL IVPB        1,250 mg 166.7 mL/hr over 90 Minutes Intravenous  Once 01/29/22 0604 01/29/22 0934        MEDICATIONS: Scheduled Meds:  amLODipine  10 mg Oral Daily   apixaban  5 mg Oral BID   calcium acetate  1,334 mg Oral TID WC   carvedilol  25 mg Oral BID WC   Chlorhexidine Gluconate Cloth  6 each Topical Q0600   cloNIDine  0.3 mg Oral TID   colchicine  0.3 mg Oral Daily   darbepoetin (ARANESP) injection - DIALYSIS  200 mcg Intravenous Q Tue-HD   feeding supplement (NEPRO CARB STEADY)  237 mL Oral BID BM   hydrALAZINE  100 mg Oral TID   hydroxychloroquine  100 mg Oral Q48H   losartan  50 mg Oral QHS   nicotine  14 mg Transdermal Daily   predniSONE  40 mg Oral Q breakfast   sodium chloride flush  3 mL Intravenous Q12H   sodium zirconium cyclosilicate  10 g Oral Daily   Continuous Infusions:  vancomycin Stopped (02/06/22 1816)   PRN Meds:.acetaminophen **OR** acetaminophen, albuterol, calcium carbonate (dosed in mg elemental calcium), docusate sodium, feeding supplement (NEPRO CARB STEADY), HYDROmorphone (DILAUDID) injection, hydrOXYzine, methocarbamol, ondansetron **OR** ondansetron (ZOFRAN) IV, oxyCODONE, phenol, sorbitol, zolpidem   I have personally reviewed following labs and imaging studies  LABORATORY DATA: CBC: Recent Labs  Lab 02/01/22 0100 02/02/22 0203 02/04/22 1000 02/06/22 1335  WBC 11.7* 13.3* 11.9* 14.5*  NEUTROABS  --   --  10.7*  --   HGB 9.2* 8.9* 9.5* 9.4*  HCT 29.2* 28.7* 29.6* 30.9*  MCV 91.8 90.8 91.4 93.4  PLT 137* 174 239 271     Basic Metabolic  Panel: Recent Labs  Lab 02/01/22 0100 02/01/22 1121 02/02/22 0203 02/04/22 1030 02/06/22 1335  NA 130*  --  134* 134* 133*  K 5.7* 5.1 3.9 4.5 4.8  CL 93*  --  95* 95* 94*  CO2 21*  --  25 23 22   GLUCOSE 104*  --  98 112* 128*  BUN 82*  --  42* 109* 109*  CREATININE 7.71*  --  4.87* 7.97* 7.47*  CALCIUM 8.1*  --  7.5* 8.3* 8.7*  PHOS 4.0  --  3.2 4.1 3.1     GFR: Estimated Creatinine Clearance: 9.9 mL/min (A) (by C-G formula based on SCr of 7.47 mg/dL (H)).  Liver Function Tests: Recent Labs  Lab 02/01/22 0100 02/02/22 0203 02/04/22 1030 02/06/22 1335  ALBUMIN 2.6* 2.4* 2.6* 2.6*    No results for input(s): LIPASE, AMYLASE in the last 168 hours. No results for input(s): AMMONIA in the last 168 hours.  Coagulation Profile: No results for input(s): INR, PROTIME in the last 168 hours.   Cardiac Enzymes: No results for input(s): CKTOTAL, CKMB, CKMBINDEX, TROPONINI in the last 168 hours.  BNP (last 3 results) No results for input(s): PROBNP in the last 8760 hours.  Lipid Profile: No results for input(s): CHOL, HDL, LDLCALC, TRIG, CHOLHDL, LDLDIRECT in the last 72 hours.  Thyroid Function Tests: No results for input(s): TSH, T4TOTAL, FREET4, T3FREE, THYROIDAB in the last 72 hours.  Anemia Panel: No results for input(s): VITAMINB12, FOLATE, FERRITIN, TIBC, IRON, RETICCTPCT in the last 72 hours.  Urine analysis:    Component Value Date/Time   COLORURINE YELLOW 11/29/2020 2054   APPEARANCEUR HAZY (A) 11/29/2020 2054   LABSPEC 1.015 11/29/2020 2054   PHURINE 6.0 11/29/2020 2054   GLUCOSEU NEGATIVE 11/29/2020 2054   HGBUR MODERATE (A) 11/29/2020 2054   BILIRUBINUR NEGATIVE 11/29/2020 2054   KETONESUR NEGATIVE 11/29/2020 2054   PROTEINUR >=300 (A) 11/29/2020 2054   UROBILINOGEN 0.2 10/17/2020 1026   NITRITE NEGATIVE 11/29/2020 2054   LEUKOCYTESUR NEGATIVE 11/29/2020 2054    Sepsis Labs: Lactic Acid, Venous    Component Value Date/Time   LATICACIDVEN 3.1  (HH) 01/29/2022 0748    MICROBIOLOGY: Recent Results (from the past 240 hour(s))  Resp Panel by RT-PCR (Flu A&B, Covid) Nasopharyngeal Swab     Status: None   Collection Time: 01/29/22  2:18 AM   Specimen: Nasopharyngeal Swab; Nasopharyngeal(NP) swabs in vial transport medium  Result Value Ref Range Status   SARS Coronavirus 2 by RT PCR NEGATIVE NEGATIVE Final    Comment: (NOTE) SARS-CoV-2 target nucleic acids are NOT DETECTED.  The SARS-CoV-2 RNA is generally detectable in upper respiratory specimens during the acute phase of infection. The lowest concentration of SARS-CoV-2 viral copies this assay can detect is 138 copies/mL. A negative result does not preclude SARS-Cov-2 infection and should not be used as the sole basis for treatment or other patient management decisions. A negative result may occur with  improper specimen collection/handling, submission of specimen other than nasopharyngeal swab, presence of viral mutation(s) within the areas targeted by this assay, and inadequate number of viral copies(<138 copies/mL). A negative result must be combined with clinical observations, patient history, and epidemiological information. The expected result is Negative.  Fact Sheet for Patients:  EntrepreneurPulse.com.au  Fact Sheet for Healthcare Providers:  IncredibleEmployment.be  This test is no t yet approved or cleared by the [place] FDA and  has been authorized for detection and/or diagnosis of SARS-CoV-2 by FDA under an Emergency Use Authorization (EUA). This EUA will remain  in effect (meaning this test can be used) for the duration of the COVID-19 declaration under Section 564(b)(1) of the Act, 21 U.S.C.section 360bbb-3(b)(1), unless the authorization is terminated  or revoked sooner.       Influenza A by PCR NEGATIVE NEGATIVE Final   Influenza B by PCR NEGATIVE NEGATIVE Final    Comment: (NOTE) The Xpert Xpress  SARS-CoV-2/FLU/RSV plus assay is intended as an aid in the diagnosis of influenza from Nasopharyngeal swab specimens and should not be used as a sole basis for treatment. Nasal washings and aspirates are unacceptable for Xpert Xpress SARS-CoV-2/FLU/RSV testing.  Fact Sheet for Patients: EntrepreneurPulse.com.au  Fact Sheet for Healthcare Providers: IncredibleEmployment.be  This test is not yet approved or cleared by the [place] FDA and has been authorized for detection and/or diagnosis of SARS-CoV-2 by FDA under an Emergency Use Authorization (EUA). This EUA will remain in effect (meaning this test can be used) for the duration of the COVID-19 declaration under Section 564(b)(1) of the Act, 21 U.S.C. section 360bbb-3(b)(1), unless the authorization is terminated or revoked.  Performed at [facility], [place] [address], [place],  [address]   Blood Culture (routine x 2)     Status: Abnormal   Collection Time: 01/29/22  2:30 AM   Specimen: BLOOD LEFT ARM  Result Value Ref Range Status   Specimen Description BLOOD LEFT ARM  Final   Special Requests   Final    BOTTLES DRAWN AEROBIC AND ANAEROBIC Blood Culture results may not be optimal due to an excessive volume of blood received in culture bottles   Culture  Setup Time   Final    GRAM POSITIVE COCCI IN CLUSTERS IN BOTH AEROBIC AND ANAEROBIC BOTTLES CRITICAL RESULT CALLED TO, READ BACK BY AND VERIFIED WITH: PHARMD M.WEISKOPH AT 0102 ON 01/29/2022 BY T.SAAD. Performed at [facility], [place] [address], [place],  [address]    Culture METHICILLIN RESISTANT STAPHYLOCOCCUS AUREUS (A)  Final   Report Status 01/31/2022 FINAL  Final   Organism ID, Bacteria METHICILLIN RESISTANT STAPHYLOCOCCUS AUREUS  Final      Susceptibility   Methicillin resistant staphylococcus aureus - MIC*    CIPROFLOXACIN <=0.5 SENSITIVE Sensitive     ERYTHROMYCIN >=8 RESISTANT Resistant     GENTAMICIN  <=0.5 SENSITIVE Sensitive     OXACILLIN >=4 RESISTANT Resistant     TETRACYCLINE <=1 SENSITIVE Sensitive     VANCOMYCIN 1 SENSITIVE Sensitive     TRIMETH/SULFA <=10 SENSITIVE Sensitive     CLINDAMYCIN <=0.25 SENSITIVE Sensitive     RIFAMPIN <=0.5 SENSITIVE Sensitive     Inducible Clindamycin NEGATIVE Sensitive     * METHICILLIN RESISTANT STAPHYLOCOCCUS AUREUS  Blood Culture ID Panel (Reflexed)     Status: Abnormal   Collection Time: 01/29/22  2:30 AM  Result Value Ref Range Status   Enterococcus faecalis NOT DETECTED NOT DETECTED Final   Enterococcus Faecium NOT DETECTED NOT DETECTED Final   Listeria monocytogenes NOT DETECTED NOT DETECTED Final   Staphylococcus species DETECTED (A) NOT DETECTED Final    Comment: CRITICAL RESULT CALLED TO, READ BACK BY AND VERIFIED WITH: PHARMD M.WEISKOPH AT 1449 ON 01/29/2022 BY T.SAAD.    Staphylococcus aureus (BCID) DETECTED (A) NOT DETECTED Final    Comment: Methicillin (oxacillin)-resistant Staphylococcus aureus (MRSA). MRSA is predictably resistant to beta-lactam antibiotics (except ceftaroline). Preferred therapy is vancomycin unless clinically contraindicated. Patient requires contact precautions if  hospitalized. CRITICAL RESULT CALLED TO, READ BACK BY AND VERIFIED WITH: PHARMD M.WEISKOPH AT 1449 ON 01/29/2022 BY T.SAAD.    Staphylococcus epidermidis NOT DETECTED NOT DETECTED Final   Staphylococcus lugdunensis NOT DETECTED NOT DETECTED Final   Streptococcus species NOT DETECTED NOT DETECTED Final   Streptococcus agalactiae NOT DETECTED NOT DETECTED Final   Streptococcus pneumoniae NOT DETECTED NOT DETECTED Final   Streptococcus pyogenes NOT DETECTED NOT DETECTED Final   A.calcoaceticus-baumannii NOT  DETECTED NOT DETECTED Final   Bacteroides fragilis NOT DETECTED NOT DETECTED Final   Enterobacterales NOT DETECTED NOT DETECTED Final   Enterobacter cloacae complex NOT DETECTED NOT DETECTED Final   Escherichia coli NOT DETECTED NOT DETECTED  Final   Klebsiella aerogenes NOT DETECTED NOT DETECTED Final   Klebsiella oxytoca NOT DETECTED NOT DETECTED Final   Klebsiella pneumoniae NOT DETECTED NOT DETECTED Final   Proteus species NOT DETECTED NOT DETECTED Final   Salmonella species NOT DETECTED NOT DETECTED Final   Serratia marcescens NOT DETECTED NOT DETECTED Final   Haemophilus influenzae NOT DETECTED NOT DETECTED Final   Neisseria meningitidis NOT DETECTED NOT DETECTED Final   Pseudomonas aeruginosa NOT DETECTED NOT DETECTED Final   Stenotrophomonas maltophilia NOT DETECTED NOT DETECTED Final   Candida albicans NOT DETECTED NOT DETECTED Final   Candida auris NOT DETECTED NOT DETECTED Final   Candida glabrata NOT DETECTED NOT DETECTED Final   Candida krusei NOT DETECTED NOT DETECTED Final   Candida parapsilosis NOT DETECTED NOT DETECTED Final   Candida tropicalis NOT DETECTED NOT DETECTED Final   Cryptococcus neoformans/gattii NOT DETECTED NOT DETECTED Final   Meth resistant mecA/C and MREJ DETECTED (A) NOT DETECTED Final    Comment: CRITICAL RESULT CALLED TO, READ BACK BY AND VERIFIED WITH: PHARMD M.WEISKOPH AT 1449 ON 01/29/2022 BY T.SAAD. Performed at [facility], [place] [address], [place], [place] [address]   Blood Culture (routine x 2)     Status: Abnormal   Collection Time: 01/29/22  2:40 AM   Specimen: BLOOD LEFT HAND  Result Value Ref Range Status   Specimen Description BLOOD LEFT HAND  Final   Special Requests   Final    BOTTLES DRAWN AEROBIC ONLY Blood Culture results may not be optimal due to an excessive volume of blood received in culture bottles   Culture  Setup Time   Final    GRAM POSITIVE COCCI IN CLUSTERS AEROBIC BOTTLE ONLY CRITICAL VALUE NOTED.  VALUE IS CONSISTENT WITH PREVIOUSLY REPORTED AND CALLED VALUE.    Culture (A)  Final    STAPHYLOCOCCUS AUREUS SUSCEPTIBILITIES PERFORMED ON PREVIOUS CULTURE WITHIN THE LAST 5 DAYS. Performed at [facility], [place] [address], [place], [place]  [address]    Report Status 01/31/2022 FINAL  Final  Culture, blood (single)     Status: Abnormal   Collection Time: 01/29/22  5:56 AM   Specimen: BLOOD  Result Value Ref Range Status   Specimen Description BLOOD CENTRAL LINE  Final   Special Requests   Final    BOTTLES DRAWN AEROBIC AND ANAEROBIC Blood Culture adequate volume   Culture  Setup Time   Final    GRAM POSITIVE COCCI IN CLUSTERS IN BOTH AEROBIC AND ANAEROBIC BOTTLES CRITICAL VALUE NOTED.  VALUE IS CONSISTENT WITH PREVIOUSLY REPORTED AND CALLED VALUE.    Culture (A)  Final    STAPHYLOCOCCUS AUREUS SUSCEPTIBILITIES PERFORMED ON PREVIOUS CULTURE WITHIN THE LAST 5 DAYS. Performed at [facility], [place] [address], [place], [place] [address]    Report Status 01/31/2022 FINAL  Final  Culture, blood (routine x 2)     Status: None   Collection Time: 01/30/22  4:32 AM   Specimen: BLOOD  Result Value Ref Range Status   Specimen Description BLOOD LEFT ANTECUBITAL  Final   Special Requests   Final    BOTTLES DRAWN AEROBIC AND ANAEROBIC Blood Culture adequate volume   Culture   Final    NO GROWTH 5 DAYS  Performed at [facility], [place] [address] [place], [place] [address]    Report Status 02/04/2022 FINAL  Final  Culture, blood (routine x 2)     Status: Abnormal   Collection Time: 01/30/22  4:45 AM   Specimen: BLOOD  Result Value Ref Range Status   Specimen Description BLOOD BLOOD RIGHT WRIST  Final   Special Requests   Final    BOTTLES DRAWN AEROBIC AND ANAEROBIC Blood Culture adequate volume   Culture  Setup Time   Final    GRAM POSITIVE COCCI IN CLUSTERS ANAEROBIC BOTTLE ONLY CRITICAL VALUE NOTED.  VALUE IS CONSISTENT WITH PREVIOUSLY REPORTED AND CALLED VALUE.    Culture (A)  Final    STAPHYLOCOCCUS AUREUS SUSCEPTIBILITIES PERFORMED ON PREVIOUS CULTURE WITHIN THE LAST 5 DAYS. Performed at [facility], [place] [address], [place], [place] [address]    Report Status 02/05/2022 FINAL  Final  Culture, blood  (routine x 2)     Status: None (Preliminary result)   Collection Time: 02/04/22  3:48 PM   Specimen: BLOOD  Result Value Ref Range Status   Specimen Description BLOOD LEFT ANTECUBITAL  Final   Special Requests   Final    BOTTLES DRAWN AEROBIC AND ANAEROBIC Blood Culture adequate volume   Culture   Final    NO GROWTH 2 DAYS Performed at [facility], [place] [address], [place], [place] [address]    Report Status PENDING  Incomplete  Culture, blood (routine x 2)     Status: None (Preliminary result)   Collection Time: 02/04/22  3:56 PM   Specimen: BLOOD  Result Value Ref Range Status   Specimen Description BLOOD RIGHT ANTECUBITAL  Final   Special Requests   Final    BOTTLES DRAWN AEROBIC AND ANAEROBIC Blood Culture adequate volume   Culture   Final    NO GROWTH 2 DAYS Performed at [facility], [place] [address], [place], [place] [address]    Report Status PENDING  Incomplete    RADIOLOGY STUDIES/RESULTS: No results found.   LOS: 9 days   Oren Binet, MD  [facility]    To contact the attending provider between 7A-7P or the covering provider during after hours 7P-7A, please log into the web site [email or web address] and access using universal [place] password for that web site. If you do not have the password, please call the hospital operator.  02/07/2022, 1:59 PM

## 2022-02-07 NOTE — Progress Notes (Signed)
Miscellaneous Pharmacy Consult - Final Antibiotic Plan ? ?Indication: MRSA bacteremia secondary to HD catheter  ?Regimen: Vancomycin 750 mg IV on HD days TTS  ?Total duration: 4 weeks ?Stop date: 03/04/2022 ? ?No OPAT orders will be entered. ? ?Jimmy Footman, PharmD, BCPS, BCIDP ?Infectious Diseases Clinical Pharmacist ?Phone: [phone] ?02/07/2022  3:07 PM ? ?Please check [email or web address] for unit-specific pharmacy phone numbers. ? ?

## 2022-02-07 NOTE — Progress Notes (Addendum)
?[name] ?Progress Note  ? ?Subjective: Now roughly 22 kg above OP EDW. HD yesterday Net UF 4 liters. HD tomorrow on schedule. Continue lowering volume as tolerated.  ? ?Objective ?Vitals:  ? 02/07/22 0323 02/07/22 0737 02/07/22 0940 02/07/22 1145  ?BP:  (!) 160/90 (!) 160/105 (!) 151/97  ?Pulse:  61 60 64  ?Resp:  17  16  ?Temp: 98.4 ?F (36.9 ?C) 97.7 ?F (36.5 ?C)  98.1 ?F (36.7 ?C)  ?TempSrc: Oral Oral  Oral  ?SpO2:  98%  98%  ?Weight:      ?Height:      ? ?General: Alert young female NAD ?Heart: RRR, no MRG  ?Lungs: CTA nonlabored breathing ?Abdomen: NABS, mild ascites, NT, soft ?Extremities: No edema BLLE  ?Vascular Access:  R TDC drsg CDI ?  ? ? ?Additional Objective ?Labs: ?Basic Metabolic Panel: ?Recent Labs  ?Lab 02/02/22 ?0203 02/04/22 ?1030 02/06/22 ?1335  ?NA 134* 134* 133*  ?K 3.9 4.5 4.8  ?CL 95* 95* 94*  ?CO2 25 23 22   ?GLUCOSE 98 112* 128*  ?BUN 42* 109* 109*  ?CREATININE 4.87* 7.97* 7.47*  ?CALCIUM 7.5* 8.3* 8.7*  ?PHOS 3.2 4.1 3.1  ? ?Liver Function Tests: ?Recent Labs  ?Lab 02/02/22 ?0203 02/04/22 ?1030 02/06/22 ?1335  ?ALBUMIN 2.4* 2.6* 2.6*  ? ?No results for input(s): LIPASE, AMYLASE in the last 168 hours. ?CBC: ?Recent Labs  ?Lab 02/01/22 ?0100 02/02/22 ?0203 02/04/22 ?1000 02/06/22 ?1335  ?WBC 11.7* 13.3* 11.9* 14.5*  ?NEUTROABS  --   --  10.7*  --   ?HGB 9.2* 8.9* 9.5* 9.4*  ?HCT 29.2* 28.7* 29.6* 30.9*  ?MCV 91.8 90.8 91.4 93.4  ?PLT 137* 174 239 271  ? ?Blood Culture ?   ?Component Value Date/Time  ? SDES BLOOD RIGHT ANTECUBITAL 02/04/2022 1556  ? SPECREQUEST  02/04/2022 1556  ?  BOTTLES DRAWN AEROBIC AND ANAEROBIC Blood Culture adequate volume  ? CULT  02/04/2022 1556  ?  NO GROWTH 2 DAYS ?Performed at [facility], [place] [address] [place], [place] [address] ?  ? REPTSTATUS PENDING 02/04/2022 1556  ? ? ?Cardiac Enzymes: ?No results for input(s): CKTOTAL, CKMB, CKMBINDEX, TROPONINI in the last 168 hours. ?CBG: ?No results for input(s): GLUCAP in the last 168  hours. ?Iron Studies: No results for input(s): IRON, TIBC, TRANSFERRIN, FERRITIN in the last 72 hours. ?@lablastinr3 @ ?Studies/Results: ?No results found. ?Medications: ? vancomycin Stopped (02/06/22 1816)  ? ? amLODipine  10 mg Oral Daily  ? apixaban  5 mg Oral BID  ? calcium acetate  1,334 mg Oral TID WC  ? carvedilol  25 mg Oral BID WC  ? Chlorhexidine Gluconate Cloth  6 each Topical Q0600  ? cloNIDine  0.3 mg Oral TID  ? colchicine  0.3 mg Oral Daily  ? darbepoetin (ARANESP) injection - DIALYSIS  200 mcg Intravenous Q Tue-HD  ? feeding supplement (NEPRO CARB STEADY)  237 mL Oral BID BM  ? hydrALAZINE  100 mg Oral TID  ? hydroxychloroquine  100 mg Oral Q48H  ? losartan  50 mg Oral QHS  ? nicotine  14 mg Transdermal Daily  ? predniSONE  40 mg Oral Q breakfast  ? sodium chloride flush  3 mL Intravenous Q12H  ? sodium zirconium cyclosilicate  10 g Oral Daily  ? ? ? ?Dialysis Orders: ?Dialyzes at [facility] TTS 4 hrs EDW 52 kg ?2K/ 2 Ca bath ?BFR 400 via TDC, DFR 800 ?Hectorol 3 q rx ?Venofer 100 q rx x 10,  ending 02/08/22 ?Mircera 225 q 2 weeks, last given 01/25/22 ?  ?Assessment/Plan: ?1 SIRS due to MRSA bacteremia sepsis- S/p R TDC 3/4 after line holiday. S/p TEE 3/6-no evidence of vegetation. Continue Vanc. Noted repeat BC from 3/2 (+) staph aureus. IR said that there is no biofilm yet w/ a new TDC so it dose not need to be removed. [facility] 02/04/2022 NGTD X 2 days. Appreciate ID and IR assistance.  ?2 ESRD: TTS HD. Plan for HD 02/08/2022  ?3. AV Access: Seen by [facility] who had planned for placement of AVF vs AVG, however this has been canceled per request of ID. Can have permanent access placed as OP.  ?4 Hypertension/volume-Very hypertensive 02/06/2022 medications reviewed. Started losartan 50 mg PO q HS. BP better today.  Continue other medications.Very much above OP EDW with ascites present. May need paracentesis. Continue to lower volume as tolerated.  Standing wt is 71kg today, technically she is up 19kg, assuming that she  has not had lean body wt gain. Marked dependent edema, no resp issues. Max UF w/ HD today. Will need extra HD in OP setting after dc.  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 Hold Venofer  with bacteremia  ?6.  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 Attempt to optimize volume with HD. Adhere to fluid restrictions.   ?8.  Lupus: on pred and plaquenil ?9.  Nutrition: albumin 2 4 add protein supplement to diet ?10. H/O PE on Eliquis ?11. Pericardial effusion: Chronic issue without evidence of tamponade. Monitor.  ? ?Jimmye Norman. Brown NP-C ?02/07/2022, 2:12 PM  ?[facility] ?[phone] ? ?Pt seen, examined and agree w assess/plan as above with additions as indicated.  ?Rob Doctor, hospital ?[facility] ?02/08/2022, 7:30 AM ? ? ? ?

## 2022-02-08 DIAGNOSIS — I129 Hypertensive chronic kidney disease with stage 1 through stage 4 chronic kidney disease, or unspecified chronic kidney disease: Secondary | ICD-10-CM

## 2022-02-08 LAB — HEPATITIS B SURFACE ANTIGEN: Hepatitis B Surface Ag: NONREACTIVE

## 2022-02-08 LAB — HEPATITIS B SURFACE ANTIBODY,QUALITATIVE: Hep B S Ab: REACTIVE — AB

## 2022-02-08 MED ORDER — LIDOCAINE HCL (PF) 1 % IJ SOLN
5.0000 mL | INTRAMUSCULAR | Status: DC | PRN
  Filled 2022-02-08: qty 5

## 2022-02-08 MED ORDER — SODIUM CHLORIDE 0.9 % IV SOLN: 100.0000 mL | INTRAVENOUS | Status: DC | PRN

## 2022-02-08 MED ORDER — CARVEDILOL 25 MG PO TABS
25.0000 mg | ORAL_TABLET | Freq: Two times a day (BID) | ORAL | 3 refills | Status: DC
  Filled 2022-02-08: qty 60, 30d supply, fill #0

## 2022-02-08 MED ORDER — ALTEPLASE 2 MG IJ SOLR: 2.0000 mg | Freq: Once | INTRAMUSCULAR | Status: DC | PRN

## 2022-02-08 MED ORDER — LIDOCAINE-PRILOCAINE 2.5-2.5 % EX CREA
1.0000 "application " | TOPICAL_CREAM | CUTANEOUS | Status: DC | PRN
  Filled 2022-02-08: qty 5

## 2022-02-08 MED ORDER — HEPARIN SODIUM (PORCINE) 1000 UNIT/ML DIALYSIS
1000.0000 [IU] | INTRAMUSCULAR | Status: DC | PRN
  Administered 2022-02-08: 3200 [IU] via INTRAVENOUS_CENTRAL
  Filled 2022-02-08: qty 1

## 2022-02-08 MED ORDER — PENTAFLUOROPROP-TETRAFLUOROETH EX AERO: 1.0000 "application " | INHALATION_SPRAY | CUTANEOUS | Status: DC | PRN

## 2022-02-08 MED ORDER — VANCOMYCIN HCL 750 MG/150ML IV SOLN: 750.0000 mg | INTRAVENOUS | Status: DC

## 2022-02-08 MED ORDER — LOSARTAN POTASSIUM 50 MG PO TABS
50.0000 mg | ORAL_TABLET | Freq: Every day | ORAL | 3 refills | Status: DC
  Filled 2022-02-08: qty 30, 30d supply, fill #0

## 2022-02-08 NOTE — Progress Notes (Addendum)
?[facility] ?Progress Note  ? ?Subjective: Seen on HD. Ranting to go home. She has DC orders but needs to run full treatment. Increased goal and increased time today.    ? ?Objective ?Vitals:  ? 02/08/22 1000 02/08/22 1015 02/08/22 1030 02/08/22 1100  ?BP: (!) 164/107 (!) 168/108 (!) 186/115 (!) 196/116  ?Pulse:      ?Resp: 18 18 (!) 24 17  ?Temp:      ?TempSrc:      ?SpO2:      ?Weight:      ?Height:      ? ?General: Alert young female NAD ?Heart: RRR, no MRG  ?Lungs: CTA nonlabored breathing ?Abdomen: NABS, mild ascites, NT, soft ?Extremities: Trace BLE edema, edema in thighs.  ?Vascular Access:  R TDC drsg CDI  ? ? ?Additional Objective ?Labs: ?Basic Metabolic Panel: ?Recent Labs  ?Lab 02/02/22 ?0203 02/04/22 ?1030 02/06/22 ?1335  ?NA 134* 134* 133*  ?K 3.9 4.5 4.8  ?CL 95* 95* 94*  ?CO2 25 23 22   ?GLUCOSE 98 112* 128*  ?BUN 42* 109* 109*  ?CREATININE 4.87* 7.97* 7.47*  ?CALCIUM 7.5* 8.3* 8.7*  ?PHOS 3.2 4.1 3.1  ? ?Liver Function Tests: ?Recent Labs  ?Lab 02/02/22 ?0203 02/04/22 ?1030 02/06/22 ?1335  ?ALBUMIN 2.4* 2.6* 2.6*  ? ?No results for input(s): LIPASE, AMYLASE in the last 168 hours. ?CBC: ?Recent Labs  ?Lab 02/02/22 ?0203 02/04/22 ?1000 02/06/22 ?1335  ?WBC 13.3* 11.9* 14.5*  ?NEUTROABS  --  10.7*  --   ?HGB 8.9* 9.5* 9.4*  ?HCT 28.7* 29.6* 30.9*  ?MCV 90.8 91.4 93.4  ?PLT 174 239 271  ? ?Blood Culture ?   ?Component Value Date/Time  ? SDES BLOOD RIGHT ANTECUBITAL 02/04/2022 1556  ? SPECREQUEST  02/04/2022 1556  ?  BOTTLES DRAWN AEROBIC AND ANAEROBIC Blood Culture adequate volume  ? CULT  02/04/2022 1556  ?  NO GROWTH 4 DAYS ?Performed at [facility], [place] [address] [place], [place] [address] ?  ? REPTSTATUS PENDING 02/04/2022 1556  ? ? ?Cardiac Enzymes: ?No results for input(s): CKTOTAL, CKMB, CKMBINDEX, TROPONINI in the last 168 hours. ?CBG: ?No results for input(s): GLUCAP in the last 168 hours. ?Iron Studies: No results for input(s): IRON, TIBC, TRANSFERRIN, FERRITIN in the  last 72 hours. ?@lablastinr3 @ ?Studies/Results: ?IR ABDOMEN US LIMITED ? ?Result Date: 02/07/2022 ?CLINICAL DATA:  Evaluate for ascites and possible paracentesis. EXAM: LIMITED ABDOMEN ULTRASOUND FOR ASCITES TECHNIQUE: Limited ultrasound survey for ascites was performed in all four abdominal quadrants. COMPARISON:  CT 01/07/2022 FINDINGS: Trace abdominal ascites on the right side and left side of the abdomen. IMPRESSION: Trace abdominal ascites.  Paracentesis not performed. Electronically Signed   By: Markus Daft M.D.   On: 02/07/2022 15:07   ?Medications: ? sodium chloride    ? sodium chloride    ? vancomycin Stopped (02/06/22 1816)  ? ? amLODipine  10 mg Oral Daily  ? apixaban  5 mg Oral BID  ? calcium acetate  1,334 mg Oral TID WC  ? carvedilol  25 mg Oral BID WC  ? Chlorhexidine Gluconate Cloth  6 each Topical Q0600  ? cloNIDine  0.3 mg Oral TID  ? colchicine  0.3 mg Oral Daily  ? darbepoetin (ARANESP) injection - DIALYSIS  200 mcg Intravenous Q Tue-HD  ? feeding supplement (NEPRO CARB STEADY)  237 mL Oral BID BM  ? hydrALAZINE  100 mg Oral TID  ? hydroxychloroquine  100 mg Oral Q48H  ? losartan  50  mg Oral QHS  ? nicotine  14 mg Transdermal Daily  ? predniSONE  40 mg Oral Q breakfast  ? sodium chloride flush  3 mL Intravenous Q12H  ? sodium zirconium cyclosilicate  10 g Oral Daily  ? ? ? ?Dialysis Orders: ?Dialyzes at [facility] TTS 4 hrs EDW 52 kg ?2K/ 2 Ca bath ?BFR 400 via TDC, DFR 800 ?Hectorol 3 q rx ?Venofer 100 q rx x 10, ending 02/08/22 ?Mircera 225 q 2 weeks, last given 01/25/22 ?  ?Assessment/Plan: ?1 SIRS due to MRSA bacteremia sepsis- S/p R TDC 3/4 after line holiday. S/p TEE 3/6-no evidence of vegetation. Continue Vanc. Noted repeat BC from 3/2 (+) staph aureus. IR said that there is no biofilm yet w/ a new TDC so it dose not need to be removed. [facility] 02/04/2022 NGTD X 2 days. Continue Vancomycin 750 mg IV with weekly trough as OP until 03/04/2022.  ?2 ESRD: TTS HD. Plan for HD 02/08/2022  ?3. AV Access: Seen by  [facility] who had planned for placement of AVF vs AVG, however this has been canceled per request of ID. Can have permanent access placed as OP.  ?4 Hypertension/volume-Very hypertensive 02/06/2022 medications reviewed. Started losartan 50 mg PO q HS. BP better today.  Continue other medications.Very much above OP EDW with ascites present. May need paracentesis. Continue to lower volume as tolerated. Max UF in HD today. Attempting net UF 4 liters. Long talk about adherence for fluid restrictions. Still 18 kgs above OP EDW. May need HD 4 days a week.  ?5. Anemia of ESRD: Hgb 9.4-last ESA 2/25 on max mircera as OP, give next on Tuesday HD Aranesp. Hold Venofer  with bacteremia  ?6.  MBD: Continue hectorol and binders, corrected calcium and phosphorus stable ?7. Combined systolic and diastolic HF: EF 98-26%, concentric LVH, G3DD. Attempt to optimize volume with HD. Adhere to fluid restrictions.   ?8.  Lupus: on pred and plaquenil ?9.  Nutrition: albumin 2 4 add protein supplement to diet ?10. H/O PE on Eliquis ?11. Pericardial effusion: Chronic issue without evidence of tamponade. Monitor.  ?Rita H. Brown NP-C ?02/08/2022, 11:38 AM  ?[facility] ?[phone] ? ?Pt seen, examined and agree w assess/plan as above with additions as indicated. OK for dc today.  ?Rob Doctor, hospital ?[facility] ?02/08/2022, 3:44 PM ? ? ? ?  ? ?

## 2022-02-08 NOTE — Discharge Summary (Signed)
PATIENT DETAILS Name: Cathy [name] Age: 41 y.o. Sex: female Date of Birth: Dec 31, 1980 MRN: [id number]. Admitting Physician: Christel Mormon, MD PCP:Pcp, No  Admit Date: 01/29/2022 Discharge date: 02/08/2022  Recommendations for Outpatient Follow-up:  Follow up with PCP in 1-2 weeks Please obtain CMP/CBC in one week Needs IV vancomycin with hemodialysis-stop date 03/04/2022  Admitted From:  Home  Disposition: Home   Discharge Condition: good  CODE STATUS:   Code Status: Full Code   Diet recommendation:  Diet Order             Diet - low sodium heart healthy           Diet renal with fluid restriction Fluid restriction: 1200 mL Fluid; Room service appropriate? Yes; Fluid consistency: Thin  Diet effective now                    Brief Summary: Patient is a 41 y.o.  female with history of ESRD, lupus, HTN, HLD-history of chronic under dialysis-numerous recent hospitalizations-presented with fever-found to have MRSA bacteremia.  TEE was negative for vegetations.  TDC was removed on 3/1-she was given a line holiday-TDC was subsequently replaced on 3/4.     Significant Hospital events: 1/13-1/17>> RLL pneumonia, large parapneumonic effusion, s/p chest tube and cefepime  1/26-1/30>> RML pneumonia, pericarditis/ effusion (? Lupus vs uremic), and lupus flare 2/5/-2/9>> hypertensive urgency, PE, EF down to 20-25% 3/1>> admit for fever-found to have MRSA bacteremia. 3/1>> tunneled HD catheter removed after dialysis. 3/4>> tunneled HD catheter placed by IR 3/6>> TEE: No vegetations. 3/7>> 1 out of 2 blood cultures drawn on 3/2 positive for MRSA.   Significant imaging studies: 3/1>> CXR: Small bilateral pleural effusions. 3/2>> Echo: No obvious vegetations, EF 35-40%.  Grade 3 diastolic dysfunction.  Trivial pericardial effusion. 3/6>> TEE 3/10>> Limited abdominal ultrasound: Trace abdominal ascites-paracentesis not performed.   Significant microbiology data: 3/1>>  COVID/influenza PCR: Positive 3/1>> blood culture: MRSA 3/2>> blood culture: 1/2 +ve MRSA 3/7>>blood culture: neg so far   Procedures: 3/1>> removal of tunneled dialysis catheter by IR. 3/4>> tunneled dialysis catheter placement by IR. 3/6>> TEE   Consults:  ID, IR, nephrology  Brief Hospital Course: SIRS due to MRSA bacteremia: Overall clinically-foci of infection felt to be [facility] catheter which was removed on 3/1.  Repeat blood cultures on 3/2 were negative-line holiday was given until 3/4-when IR replaced TDC.  Unfortunately 1 out of 2 sets of blood cultures done on 3/2 grew out MRSA again (after 4 days on 3/7).  As discussed with IR on 3/7-existing TDC does not need to be removed, repeat blood cultures on 3/7 are negative so far.  Discussed with infectious disease MD-Dr.Manandhar on 3/9-she recommends continued inpatient monitoring at least until 3/11 to ensure blood cultures drawn on 2/7 continue to stay negative before consideration of discharge and placement of aVF.  Thankfully-blood cultures continue to be negative-as discussed with ID previously-plans are to discharge home today, nephrology will arrange for outpatient follow-up with vascular surgery for eventual AVF placement.  Continue IV vancomycin with HD until 4/4.    Note-patient has been requesting discharge for the past several days-she has childcare issues-and is really wanting to go home today.   ESRD on HD TTS: Nephrology following.  Longstanding history of under dialysis/noncompliance.  Vascular surgery was planning on AV fistula placement on 3/8-but given positive blood cultures-this has now been postponed.  Nephrology will arrange for outpatient follow-up with vascular surgery.   Hyperkalemia: Resolved  after HD on 3/4.   Normocytic anemia: Due to CKD-defer Aranesp/iron therapy to nephrology.   Chronic combined systolic and diastolic heart failure: Trace lower extremity edema but otherwise stable.  Diuresis with HD.  Given  echocardiogram findings concerning for infiltrative cardiomyopathy-outpatient cardiology appointment has been scheduled for 3/16.   Pericardial effusion: Appears to be a chronic issue-suspect related to poor compliance to dialysis/under dialysis.  Echo on 3/2 showed small pericardial effusion.   Pulmonary embolism: Remains on Eliquis.   HTN: BP better controlled-continue losartan, Coreg, amlodipine, clonidine and hydralazine   History of lupus: Appears stable-continue Plaquenil and prednisone   Elevated point-of-care beta-hCG: Likely false positive elevation-Per patient-she has not had her menstrual period for close to a year-and she has not engage in sexual intercourse for "years".  Formal beta hCG not elevated.   Tobacco abuse: Encourage cessation-on transdermal nicotine.   Debility/deconditioning: Some of this is chronic-likely worsened by MRSA bacteremia.  Mobilize with rehab services and see how she does.   BMI: Estimated body mass index is 24.34 kg/m as calculated from the following:   Height as of this encounter: 5\' 7"  (1.702 m).   Weight as of this encounter: 70.5 kg.    Discharge Diagnoses:  Principal Problem:   MRSA bacteremia Active Problems:   Chronic combined systolic and diastolic congestive heart failure (HCC)   Renal hypertension   Lupus nephritis (HCC)   ESRD on hemodialysis (HCC)   Acute pulmonary embolus (HCC)   Tobacco dependence   Discharge Instructions:  Activity:  As tolerated   Discharge Instructions     Diet - low sodium heart healthy   Complete by: As directed    Discharge instructions   Complete by: As directed    Follow with Primary MD in 1-2 weeks  Outpatient dialysis schedule as previous  You will get IV vancomycin with dialysis until 03/04/2022  Please get a complete blood count and chemistry panel checked by your Primary MD at your next visit, and again as instructed by your Primary MD.  Get Medicines reviewed and adjusted: Please  take all your medications with you for your next visit with your Primary MD  Laboratory/radiological data: Please request your Primary MD to go over all hospital tests and procedure/radiological results at the follow up, please ask your Primary MD to get all Hospital records sent to his/her office.  In some cases, they will be blood work, cultures and biopsy results pending at the time of your discharge. Please request that your primary care M.D. follows up on these results.  Also Note the following: If you experience worsening of your admission symptoms, develop shortness of breath, life threatening emergency, suicidal or homicidal thoughts you must seek medical attention immediately by calling 911 or calling your MD immediately  if symptoms less severe.  You must read complete instructions/literature along with all the possible adverse reactions/side effects for all the Medicines you take and that have been prescribed to you. Take any new Medicines after you have completely understood and accpet all the possible adverse reactions/side effects.   Do not drive when taking Pain medications or sleeping medications (Benzodaizepines)  Do not take more than prescribed Pain, Sleep and Anxiety Medications. It is not advisable to combine anxiety,sleep and pain medications without talking with your primary care practitioner  Special Instructions: If you have smoked or chewed Tobacco  in the last 2 yrs please stop smoking, stop any regular Alcohol  and or any Recreational drug use.  Wear Seat  belts while driving.  Please note: You were cared for by a hospitalist during your hospital stay. Once you are discharged, your primary care physician will handle any further medical issues. Please note that NO REFILLS for any discharge medications will be authorized once you are discharged, as it is imperative that you return to your primary care physician (or establish a relationship with a primary care physician  if you do not have one) for your post hospital discharge needs so that they can reassess your need for medications and monitor your lab values.   Increase activity slowly   Complete by: As directed    No dressing needed   Complete by: As directed       Allergies as of 02/08/2022       Reactions   Oxycodone Itching        Medication List     STOP taking these medications    methocarbamol 500 MG tablet Commonly known as: ROBAXIN   torsemide 20 MG tablet Commonly known as: DEMADEX       TAKE these medications    acetaminophen 500 MG tablet Commonly known as: TYLENOL Take 1,000 mg by mouth every 6 (six) hours as needed for moderate pain.   albuterol 108 (90 Base) MCG/ACT inhaler Commonly known as: VENTOLIN HFA Inhale 2 puffs into the lungs every 6 (six) hours as needed for wheezing or shortness of breath.   amLODipine 10 MG tablet Commonly known as: NORVASC Take 1 tablet (10 mg total) by mouth daily.   calcium acetate 667 MG capsule Commonly known as: PHOSLO Take 2 capsules (1,334 mg total) by mouth 3 (three) times daily with meals.   carvedilol 25 MG tablet Commonly known as: COREG Take 1 tablet (25 mg total) by mouth 2 (two) times daily with a meal. What changed:  medication strength how much to take   cloNIDine 0.3 MG tablet Commonly known as: CATAPRES Take 1 tablet (0.3 mg total) by mouth 3 (three) times daily.   colchicine 0.6 MG tablet Take 0.5 tablets (0.3 mg total) by mouth daily.   Eliquis 5 MG Tabs tablet Generic drug: apixaban Take 1 tablet (5 mg total) by mouth 2 (two) times daily.   guaiFENesin 600 MG 12 hr tablet Commonly known as: MUCINEX Take 600 mg by mouth 2 (two) times daily as needed for cough.   hydrALAZINE 100 MG tablet Commonly known as: APRESOLINE Take 1 tablet (100 mg total) by mouth 3 (three) times daily.   hydroxychloroquine 200 MG tablet Commonly known as: PLAQUENIL Take 1/2 tablet (100 mg total) by mouth every other  day after hemodialysis   hydrOXYzine 25 MG tablet Commonly known as: ATARAX Take 1 tablet (25 mg total) by mouth 3 (three) times daily as needed for itching or anxiety.   ipratropium 17 MCG/ACT inhaler Commonly known as: ATROVENT HFA Inhale 2 puffs into the lungs every 6 (six) hours as needed for wheezing.   loratadine 10 MG tablet Commonly known as: CLARITIN Take 10 mg by mouth daily.   losartan 50 MG tablet Commonly known as: COZAAR Take 1 tablet (50 mg total) by mouth at bedtime.   nicotine 21 mg/24hr patch Commonly known as: NICODERM CQ - dosed in mg/24 hours Place 1 patch (21 mg total) onto the skin daily.   Omron 3 Series BP Monitor Devi Use as directed 2 times a day   oxyCODONE 5 MG immediate release tablet Commonly known as: Oxy IR/ROXICODONE Take 1 tablet (5 mg total) by  mouth every 6 (six) hours as needed for severe pain.   predniSONE 20 MG tablet Commonly known as: DELTASONE Take 2 tablets (40 mg total) by mouth daily with breakfast.   vancomycin 750 MG/150ML Soln Commonly known as: VANCOREADY Inject 150 mLs (750 mg total) into the vein Every Tuesday,Thursday,and Saturday with dialysis for 24 days.               Discharge Care Instructions  (From admission, onward)           Start     Ordered   02/08/22 0000  No dressing needed        02/08/22 1013            Follow-up Information     [facility] Def[facility], [place] Follow up.   Specialties: Cardiology, Radiology Why: Hospital follow-up with Cardiology scheduled fro 02/13/2022 at 10:05am. Please arrive 15 mintues early for check-in. If this date/time does not work for you, please call our office to reschedule. Contact information: [address] [place] [address] [phone]         Lelon Perla, MD. Schedule an appointment as soon as possible for a visit in 1 week(s).   Specialty: Cardiology Contact information: [address] [place]  [address] [phone]                Allergies  Allergen Reactions   Oxycodone Itching     Other Procedures/Studies: DG Chest 2 View  Result Date: 01/29/2022 CLINICAL DATA:  Possible sepsis. EXAM: CHEST - 2 VIEW COMPARISON:  Chest radiograph dated 01/05/2022. FINDINGS: Dialysis catheter in similar position. Small bilateral pleural effusions with bibasilar atelectasis. No pneumothorax. Stable cardiomegaly. No acute osseous pathology. IMPRESSION: 1. Small bilateral pleural effusions with bibasilar atelectasis. 2. Cardiomegaly. Electronically Signed   By: Anner Crete M.D.   On: 01/29/2022 03:04   IR Removal Tun Cv Cath W/O FL  Result Date: 01/30/2022 INDICATION: Patient with end-stage renal disease on HD via right IJ tunneled dialysis catheter placed 06/21/2021 by Dr. Pascal Lux. Patient admitted with line infection, bacteremia. Request made for tunneled dialysis catheter removal EXAM: REMOVAL TUNNELED CENTRAL VENOUS CATHETER MEDICATIONS: 12 mL 1% lidocaine. ANESTHESIA/SEDATION: None FLUOROSCOPY: None COMPLICATIONS: None immediate. PROCEDURE: Informed written consent was obtained from the patient after a thorough discussion of the procedural risks, benefits and alternatives. All questions were addressed. Maximal Sterile Barrier Technique was utilized including caps, mask, sterile gowns, sterile gloves, sterile drape, hand hygiene and skin antiseptic. A timeout was performed prior to the initiation of the procedure. The patient's right chest and catheter was prepped and draped in a normal sterile fashion. Heparin was removed from both ports of catheter. 1% lidocaine was used for local anesthesia. Using gentle blunt dissection the cuff of the catheter was exposed and the catheter was removed in its entirety. Bloody pus flowed from tract upon removal of catheter. Pressure was held till hemostasis was obtained. A sterile dressing was applied. The patient tolerated the procedure well with no immediate  complications. Tip was not sent for culture as it was not requested and patient has blood cultures pending. IMPRESSION: Successful catheter removal as described above. Read by: Brynda Greathouse PA-C Electronically Signed   By: Ruthann Cancer M.D.   On: 01/30/2022 07:35   ECHOCARDIOGRAM COMPLETE  Result Date: 01/30/2022    ECHOCARDIOGRAM REPORT   Patient Name:   Cathy [name] Date of Exam: 01/30/2022 Medical Rec #:  [id number]  Height:       67.0 in Accession #:    [id number]       Weight:       140.0 lb Date of Birth:  25-Oct-1981       BSA:          1.738 m Patient Age:    49 years         BP:           117/91 mmHg Patient Gender: F                HR:           74 bpm. Exam Location:  Inpatient Procedure: 2D Echo Indications:    Bacteremia  History:        Patient has prior history of Echocardiogram examinations, most                 recent 01/06/2022. Pericardial Disease; Signs/Symptoms:Shortness                 of Breath.  Sonographer:    Arlyss Gandy Referring Phys: [id number] [place]  1. Left ventricular ejection fraction, by estimation, is 35 to 40%. The left ventricle has mildly decreased function. The left ventricle demonstrates global hypokinesis. There is moderate concentric left ventricular hypertrophy. The appearance of the myocardium is concerning for possible infiltrative cardiomypathy - consider further work-up for amyloid. Left ventricular diastolic parameters are consistent with Grade III diastolic dysfunction (restrictive). Elevated left ventricular end-diastolic pressure. The E/e' is 86.  2. Right ventricular systolic function is mildly reduced. The right ventricular size is normal. There is normal pulmonary artery systolic pressure. The estimated right ventricular systolic pressure is 09.3 mmHg.  3. Left atrial size was mildly dilated.  4. Right atrial size was moderately dilated.  5. The pericardial effusion is posterior to the left ventricle.  6. The mitral valve is  abnormal. Mild mitral valve regurgitation.  7. The tricuspid valve is abnormal. Tricuspid valve regurgitation is moderate to severe.  8. The aortic valve is tricuspid. Aortic valve regurgitation is trivial. Aortic valve sclerosis is present, with no evidence of aortic valve stenosis.  9. Mildly dilated pulmonary artery. Comparison(s): Changes from prior study are noted. 01/06/2022: LVEF 20-25%. Conclusion(s)/Recommendation(s): Although there is valve thickening, there is No evidence of valvular vegetation on this transthoracic echocardiogram. Consider a transesophageal echocardiogram to exclude infective endocarditis if clinically indicated. FINDINGS  Left Ventricle: Left ventricular ejection fraction, by estimation, is 35 to 40%. The left ventricle has moderately decreased function. The left ventricle demonstrates global hypokinesis. The left ventricular internal cavity size was normal in size. There is moderate concentric left ventricular hypertrophy. Left ventricular diastolic parameters are consistent with Grade III diastolic dysfunction (restrictive). Elevated left ventricular end-diastolic pressure. The E/e' is 39. Right Ventricle: The right ventricular size is normal. No increase in right ventricular wall thickness. Right ventricular systolic function is mildly reduced. There is normal pulmonary artery systolic pressure. The tricuspid regurgitant velocity is 1.16 m/s, and with an assumed right atrial pressure of 15 mmHg, the estimated right ventricular systolic pressure is 81.8 mmHg. Left Atrium: Left atrial size was mildly dilated. Right Atrium: Right atrial size was moderately dilated. Pericardium: Trivial pericardial effusion is present. The pericardial effusion is posterior to the left ventricle. Mitral Valve: The mitral valve is abnormal. There is moderate thickening of the anterior and posterior mitral valve leaflet(s). Mild mitral valve regurgitation. Tricuspid Valve: The tricuspid valve is abnormal.  Tricuspid valve regurgitation  is moderate to severe. Aortic Valve: The aortic valve is tricuspid. Aortic valve regurgitation is trivial. Aortic valve sclerosis is present, with no evidence of aortic valve stenosis. Aortic valve mean gradient measures 4.0 mmHg. Aortic valve peak gradient measures 8.0 mmHg. Aortic valve area, by VTI measures 2.51 cm. Pulmonic Valve: The pulmonic valve was grossly normal. Pulmonic valve regurgitation is mild. Aorta: The aortic root and ascending aorta are structurally normal, with no evidence of dilitation. Pulmonary Artery: The pulmonary artery is mildly dilated. IAS/Shunts: There is left bowing of the interatrial septum, suggestive of elevated right atrial pressure. No atrial level shunt detected by color flow Doppler.  LEFT VENTRICLE PLAX 2D LVIDd:         4.80 cm   Diastology LVIDs:         4.00 cm   LV e' medial:    4.57 cm/s LV PW:         1.20 cm   LV E/e' medial:  33.5 LV IVS:        1.20 cm   LV e' lateral:   6.31 cm/s LVOT diam:     2.00 cm   LV E/e' lateral: 24.2 LV SV:         61 LV SV Index:   35 LVOT Area:     3.14 cm  RIGHT VENTRICLE            IVC RV Basal diam:  4.40 cm    IVC diam: 3.10 cm RV Mid diam:    3.50 cm RV S prime:     7.29 cm/s TAPSE (M-mode): 1.1 cm LEFT ATRIUM           Index        RIGHT ATRIUM           Index LA diam:      3.80 cm 2.19 cm/m   RA Area:     23.40 cm LA Vol (A2C): 63.4 ml 36.48 ml/m  RA Volume:   73.20 ml  42.12 ml/m LA Vol (A4C): 37.2 ml 21.41 ml/m  AORTIC VALVE AV Area (Vmax):    2.72 cm AV Area (Vmean):   2.53 cm AV Area (VTI):     2.51 cm AV Vmax:           141.00 cm/s AV Vmean:          93.000 cm/s AV VTI:            0.243 m AV Peak Grad:      8.0 mmHg AV Mean Grad:      4.0 mmHg LVOT Vmax:         122.00 cm/s LVOT Vmean:        74.900 cm/s LVOT VTI:          0.194 m LVOT/AV VTI ratio: 0.80  AORTA Ao Root diam: 2.80 cm Ao Asc diam:  2.80 cm MITRAL VALVE                TRICUSPID VALVE MV Area (PHT): 5.84 cm     TR Peak grad:    5.4 mmHg MV Decel Time: 130 msec     TR Vmax:        116.00 cm/s MV E velocity: 153.00 cm/s MV A velocity: 50.10 cm/s   SHUNTS MV E/A ratio:  3.05         Systemic VTI:  0.19 m  Systemic Diam: 2.00 cm Lyman Bishop MD Electronically signed by Lyman Bishop MD Signature Date/Time: 01/30/2022/1:12:26 PM    Final    IR ABDOMEN US LIMITED  Result Date: 02/07/2022 CLINICAL DATA:  Evaluate for ascites and possible paracentesis. EXAM: LIMITED ABDOMEN ULTRASOUND FOR ASCITES TECHNIQUE: Limited ultrasound survey for ascites was performed in all four abdominal quadrants. COMPARISON:  CT 01/07/2022 FINDINGS: Trace abdominal ascites on the right side and left side of the abdomen. IMPRESSION: Trace abdominal ascites.  Paracentesis not performed. Electronically Signed   By: Markus Daft M.D.   On: 02/07/2022 15:07   ECHO TEE  Result Date: 02/03/2022    TRANSESOPHOGEAL ECHO REPORT   Patient Name:   Cathy [name] Date of Exam: 02/03/2022 Medical Rec #:  [id number]        Height:       67.0 in Accession #:    [id number]       Weight:       152.3 lb Date of Birth:  06-Feb-1981       BSA:          1.801 m Patient Age:    89 years         BP:           177/112 mmHg Patient Gender: F                HR:           65 bpm. Exam Location:  Inpatient Procedure: Transesophageal Echo, 3D Echo, Color Doppler and Cardiac Doppler Indications:     Bacteremia  History:         Patient has prior history of Echocardiogram examinations, most                  recent 01/30/2022. Risk Factors:Hypertension and ESRD, Lupus.  Sonographer:     Raquel Sarna Senior RDCS Referring Phys:  [id number] Darreld Mclean Diagnosing Phys: Godfrey Pick Tobb DO PROCEDURE: After discussion of the risks and benefits of a TEE, an informed consent was obtained from the patient. The transesophogeal probe was passed without difficulty through the esophogus of the patient. Sedation performed by different physician. The patient was monitored while under deep  sedation. Anesthestetic sedation was provided intravenously by Anesthesiology: 155mg  of Propofol. The patient's vital signs; including heart rate, blood pressure, and oxygen saturation; remained stable throughout the procedure. The patient developed no complications during the procedure. IMPRESSIONS  1. Left ventricular ejection fraction, by estimation, is 35 to 40%. The left ventricle has moderately decreased function. The left ventricle demonstrates global hypokinesis.  2. Right ventricular systolic function is normal. The right ventricular size is normal.  3. No left atrial/left atrial appendage thrombus was detected. The LAA emptying velocity was 70 cm/s.  4. Catheter tip seen in the right atrium, no evidence of vegetation.  5. The mitral valve is normal in structure. Mild mitral valve regurgitation. No evidence of mitral stenosis.  6. Tricuspid valve regurgitation is moderate.  7. The aortic valve is normal in structure. Aortic valve regurgitation is mild. No aortic stenosis is present.  8. The inferior vena cava is normal in size with greater than 50% respiratory variability, suggesting right atrial pressure of 3 mmHg. Conclusion(s)/Recommendation(s): No evidence of vegetation/infective endocarditis on this transesophageael echocardiogram. FINDINGS  Left Ventricle: Left ventricular ejection fraction, by estimation, is 35 to 40%. The left ventricle has moderately decreased function. The left ventricle demonstrates global hypokinesis. The left ventricular internal cavity size was normal in size. There  is no left ventricular hypertrophy. Right Ventricle: The right ventricular size is normal. No increase in right ventricular wall thickness. Right ventricular systolic function is normal. Left Atrium: Left atrial size was normal in size. No left atrial/left atrial appendage thrombus was detected. The LAA emptying velocity was 70 cm/s. Right Atrium: Right atrial size was normal in size. Catheter tip seen in the right  atrium, no evidence of vegetation. Pericardium: There is no evidence of pericardial effusion. Mitral Valve: The mitral valve is normal in structure. Mild mitral valve regurgitation. No evidence of mitral valve stenosis. Tricuspid Valve: The tricuspid valve is normal in structure. Tricuspid valve regurgitation is moderate . No evidence of tricuspid stenosis. Aortic Valve: The aortic valve is normal in structure. Aortic valve regurgitation is mild. No aortic stenosis is present. Pulmonic Valve: The pulmonic valve was normal in structure. Pulmonic valve regurgitation is trivial. No evidence of pulmonic stenosis. Aorta: The aortic root is normal in size and structure. Pulmonary Artery: The pulmonary artery is not well seen. The pulmonary artery is of normal size. Venous: The right upper pulmonary vein, left upper pulmonary vein and left lower pulmonary vein are normal. A normal flow pattern is recorded from the left upper pulmonary vein. The inferior vena cava is normal in size with greater than 50% respiratory variability, suggesting right atrial pressure of 3 mmHg. IAS/Shunts: No atrial level shunt detected by color flow Doppler. Additional Comments: There is a small pleural effusion in the left lateral region. Mild ascites is present.  TRICUSPID VALVE TR Peak grad:   22.7 mmHg TR Vmax:        238.00 cm/s Kardie Tobb DO Electronically signed by Berniece Salines DO Signature Date/Time: 02/03/2022/12:28:25 PM    Final    VAS [place] UPPER EXT VEIN MAPPING (PRE-OP AVF)  Result Date: 02/02/2022 UPPER EXTREMITY VEIN MAPPING Patient Name:  MIIA BLANKS  Date of Exam:   02/01/2022 Medical Rec #: [id number]         Accession #:    [id number] Date of Birth: November 06, 1981        Patient Gender: F Patient Age:   78 years Exam Location:  [facility] Procedure:      VAS [place] UPPER EXT VEIN MAPPING (PRE-OP AVF) Referring Phys: Aldona Bar RHYNE --------------------------------------------------------------------------------  Indications:  Pre-access. Comparison Study: No prior studies. Performing Technologist: Oliver Hum RVT  Examination Guidelines: A complete evaluation includes B-mode imaging, spectral Doppler, color Doppler, and power Doppler as needed of all accessible portions of each vessel. Bilateral testing is considered an integral part of a complete examination. Limited examinations for reoccurring indications may be performed as noted. +-----------------+-------------+----------+---------+  Right Cephalic    Diameter (cm) Depth (cm) Findings   +-----------------+-------------+----------+---------+  Shoulder              0.34         0.46               +-----------------+-------------+----------+---------+  Prox upper arm        0.35         0.23               +-----------------+-------------+----------+---------+  Mid upper arm         0.38         0.14               +-----------------+-------------+----------+---------+  Dist upper arm        0.38  0.13               +-----------------+-------------+----------+---------+  Antecubital fossa     0.44         0.15    branching  +-----------------+-------------+----------+---------+  Prox forearm          0.24         0.28    branching  +-----------------+-------------+----------+---------+  Mid forearm           0.06         0.14               +-----------------+-------------+----------+---------+  Dist forearm          0.06         0.12               +-----------------+-------------+----------+---------+ +-----------------+-------------+----------+---------+  Right Basilic     Diameter (cm) Depth (cm) Findings   +-----------------+-------------+----------+---------+  Shoulder              0.42         0.83               +-----------------+-------------+----------+---------+  Mid upper arm         0.45         0.47               +-----------------+-------------+----------+---------+  Dist upper arm        0.46         0.37    branching   +-----------------+-------------+----------+---------+  Antecubital fossa     0.19         0.12               +-----------------+-------------+----------+---------+  Prox forearm          0.25         0.18    branching  +-----------------+-------------+----------+---------+  Mid forearm           0.20         0.09               +-----------------+-------------+----------+---------+  Distal forearm        0.22         0.08               +-----------------+-------------+----------+---------+ +-----------------+-------------+----------+---------+  Left Cephalic     Diameter (cm) Depth (cm) Findings   +-----------------+-------------+----------+---------+  Shoulder              0.35         0.85               +-----------------+-------------+----------+---------+  Prox upper arm        0.36         0.20               +-----------------+-------------+----------+---------+  Mid upper arm         0.32         0.15    branching  +-----------------+-------------+----------+---------+  Dist upper arm        0.32         0.23               +-----------------+-------------+----------+---------+  Antecubital fossa     0.26         0.22    branching  +-----------------+-------------+----------+---------+  Prox forearm          0.24         0.14  branching  +-----------------+-------------+----------+---------+  Mid forearm           0.22         0.21    branching  +-----------------+-------------+----------+---------+  Dist forearm          0.19         0.10               +-----------------+-------------+----------+---------+ +-----------------+-------------+----------+---------+  Left Basilic      Diameter (cm) Depth (cm) Findings   +-----------------+-------------+----------+---------+  Shoulder              0.29         0.53               +-----------------+-------------+----------+---------+  Mid upper arm         0.33         0.51               +-----------------+-------------+----------+---------+  Dist upper arm         0.42         0.37    branching  +-----------------+-------------+----------+---------+  Antecubital fossa     0.32         0.20               +-----------------+-------------+----------+---------+  Prox forearm          0.19         0.15    branching  +-----------------+-------------+----------+---------+  Mid forearm           0.13         0.08               +-----------------+-------------+----------+---------+  Distal forearm        0.10         0.09               +-----------------+-------------+----------+---------+ *See table(s) above for measurements and observations.  Diagnosing physician: Orlie Pollen Electronically signed by Orlie Pollen on 02/02/2022 at 10:13:07 AM.    Final      TODAY-DAY OF DISCHARGE:  Subjective:   Cathy [name] today has no headache,no chest abdominal pain,no new weakness tingling or numbness, feels much better wants to go home today.  Objective:   Blood pressure (!) 164/107, pulse 65, temperature 98 F (36.7 C), temperature source Oral, resp. rate 18, height 5\' 7"  (1.702 m), weight 70.5 kg, SpO2 100 %, unknown if currently breastfeeding.  Intake/Output Summary (Last 24 hours) at 02/08/2022 1014 Last data filed at 02/08/2022 0445 Gross per 24 hour  Intake 240 ml  Output 1 ml  Net 239 ml   Filed Weights   02/01/22 1851 02/06/22 1314 02/06/22 1728  Weight: 69.1 kg 74.2 kg 70.5 kg    Exam: Awake Alert, Oriented *3, No new F.N deficits, Normal affect [place].AT,PERRAL Supple Neck,No JVD, No cervical lymphadenopathy appriciated.  Symmetrical Chest wall movement, Good air movement bilaterally, CTAB RRR,No Gallops,Rubs or new Murmurs, No Parasternal Heave +ve B.Sounds, Abd Soft, Non tender, No organomegaly appriciated, No rebound -guarding or rigidity. No Cyanosis, Clubbing or edema, No new Rash or bruise   PERTINENT RADIOLOGIC STUDIES: IR ABDOMEN US LIMITED  Result Date: 02/07/2022 CLINICAL DATA:  Evaluate for ascites and possible paracentesis. EXAM: LIMITED  ABDOMEN ULTRASOUND FOR ASCITES TECHNIQUE: Limited ultrasound survey for ascites was performed in all four abdominal quadrants. COMPARISON:  CT 01/07/2022 FINDINGS: Trace abdominal ascites on the right side and left side of the abdomen. IMPRESSION: Trace abdominal ascites.  Paracentesis not performed. Electronically Signed   By: Markus Daft M.D.   On: 02/07/2022 15:07     PERTINENT LAB RESULTS: CBC: Recent Labs    02/06/22 1335  WBC 14.5*  HGB 9.4*  HCT 30.9*  PLT 271   CMET CMP     Component Value Date/Time   NA 133 (L) 02/06/2022 1335   NA 136 12/10/2018 1142   K 4.8 02/06/2022 1335   CL 94 (L) 02/06/2022 1335   CO2 22 02/06/2022 1335   GLUCOSE 128 (H) 02/06/2022 1335   BUN 109 (H) 02/06/2022 1335   BUN 9 12/10/2018 1142   CREATININE 7.47 (H) 02/06/2022 1335   CALCIUM 8.7 (L) 02/06/2022 1335   PROT 6.2 (L) 01/29/2022 0246   PROT 6.1 12/10/2018 1142   ALBUMIN 2.6 (L) 02/06/2022 1335   ALBUMIN 3.1 (L) 12/10/2018 1142   AST 30 01/29/2022 0246   ALT 44 01/29/2022 0246   ALKPHOS 192 (H) 01/29/2022 0246   BILITOT 0.9 01/29/2022 0246   BILITOT <0.2 12/10/2018 1142   GFRNONAA 7 (L) 02/06/2022 1335   GFRAA >60 08/07/2020 1209    GFR Estimated Creatinine Clearance: 9.9 mL/min (A) (by C-G formula based on SCr of 7.47 mg/dL (H)). No results for input(s): LIPASE, AMYLASE in the last 72 hours. No results for input(s): CKTOTAL, CKMB, CKMBINDEX, TROPONINI in the last 72 hours. Invalid input(s): POCBNP No results for input(s): DDIMER in the last 72 hours. No results for input(s): HGBA1C in the last 72 hours. No results for input(s): CHOL, HDL, LDLCALC, TRIG, CHOLHDL, LDLDIRECT in the last 72 hours. No results for input(s): TSH, T4TOTAL, T3FREE, THYROIDAB in the last 72 hours.  Invalid input(s): FREET3 No results for input(s): VITAMINB12, FOLATE, FERRITIN, TIBC, IRON, RETICCTPCT in the last 72 hours. Coags: No results for input(s): INR in the last 72 hours.  Invalid input(s):  PT Microbiology: Recent Results (from the past 240 hour(s))  Culture, blood (routine x 2)     Status: None   Collection Time: 01/30/22  4:32 AM   Specimen: BLOOD  Result Value Ref Range Status   Specimen Description BLOOD LEFT ANTECUBITAL  Final   Special Requests   Final    BOTTLES DRAWN AEROBIC AND ANAEROBIC Blood Culture adequate volume   Culture   Final    NO GROWTH 5 DAYS Performed at [facility], [address], [place], [place] [address]    Report Status 02/04/2022 FINAL  Final  Culture, blood (routine x 2)     Status: Abnormal   Collection Time: 01/30/22  4:45 AM   Specimen: BLOOD  Result Value Ref Range Status   Specimen Description BLOOD BLOOD RIGHT WRIST  Final   Special Requests   Final    BOTTLES DRAWN AEROBIC AND ANAEROBIC Blood Culture adequate volume   Culture  Setup Time   Final    GRAM POSITIVE COCCI IN CLUSTERS ANAEROBIC BOTTLE ONLY CRITICAL VALUE NOTED.  VALUE IS CONSISTENT WITH PREVIOUSLY REPORTED AND CALLED VALUE.    Culture (A)  Final    STAPHYLOCOCCUS AUREUS SUSCEPTIBILITIES PERFORMED ON PREVIOUS CULTURE WITHIN THE LAST 5 DAYS. Performed at [facility], [place] [address] [place], [place] [address]    Report Status 02/05/2022 FINAL  Final  Culture, blood (routine x 2)     Status: None (Preliminary result)   Collection Time: 02/04/22  3:48 PM   Specimen: BLOOD  Result Value Ref Range Status   Specimen Description BLOOD LEFT ANTECUBITAL  Final  Special Requests   Final    BOTTLES DRAWN AEROBIC AND ANAEROBIC Blood Culture adequate volume   Culture   Final    NO GROWTH 4 DAYS Performed at [facility], [place] [address], [place], [place] [address]    Report Status PENDING  Incomplete  Culture, blood (routine x 2)     Status: None (Preliminary result)   Collection Time: 02/04/22  3:56 PM   Specimen: BLOOD  Result Value Ref Range Status   Specimen Description BLOOD RIGHT ANTECUBITAL  Final   Special Requests   Final    BOTTLES DRAWN  AEROBIC AND ANAEROBIC Blood Culture adequate volume   Culture   Final    NO GROWTH 4 DAYS Performed at [facility], [place] [address] [place], [place] [address]    Report Status PENDING  Incomplete    FURTHER DISCHARGE INSTRUCTIONS:  Get Medicines reviewed and adjusted: Please take all your medications with you for your next visit with your Primary MD  Laboratory/radiological data: Please request your Primary MD to go over all hospital tests and procedure/radiological results at the follow up, please ask your Primary MD to get all Hospital records sent to his/her office.  In some cases, they will be blood work, cultures and biopsy results pending at the time of your discharge. Please request that your primary care M.D. goes through all the records of your hospital data and follows up on these results.  Also Note the following: If you experience worsening of your admission symptoms, develop shortness of breath, life threatening emergency, suicidal or homicidal thoughts you must seek medical attention immediately by calling 911 or calling your MD immediately  if symptoms less severe.  You must read complete instructions/literature along with all the possible adverse reactions/side effects for all the Medicines you take and that have been prescribed to you. Take any new Medicines after you have completely understood and accpet all the possible adverse reactions/side effects.   Do not drive when taking Pain medications or sleeping medications (Benzodaizepines)  Do not take more than prescribed Pain, Sleep and Anxiety Medications. It is not advisable to combine anxiety,sleep and pain medications without talking with your primary care practitioner  Special Instructions: If you have smoked or chewed Tobacco  in the last 2 yrs please stop smoking, stop any regular Alcohol  and or any Recreational drug use.  Wear Seat belts while driving.  Please note: You were cared for by a hospitalist  during your hospital stay. Once you are discharged, your primary care physician will handle any further medical issues. Please note that NO REFILLS for any discharge medications will be authorized once you are discharged, as it is imperative that you return to your primary care physician (or establish a relationship with a primary care physician if you do not have one) for your post hospital discharge needs so that they can reassess your need for medications and monitor your lab values.  Total Time spent coordinating discharge including counseling, education and face to face time equals greater than 30 minutes.  Signed: Lashae Wollenberg 02/08/2022 10:14 AM

## 2022-02-09 LAB — CULTURE, BLOOD (ROUTINE X 2)
Culture: NO GROWTH
Culture: NO GROWTH
Special Requests: ADEQUATE
Special Requests: ADEQUATE

## 2022-02-10 ENCOUNTER — Other Ambulatory Visit (HOSPITAL_COMMUNITY): Payer: Self-pay

## 2022-02-10 ENCOUNTER — Telehealth: Payer: Self-pay | Admitting: Nephrology

## 2022-02-10 LAB — HEPATITIS B SURFACE ANTIBODY, QUANTITATIVE: Hep B S AB Quant (Post): 20.6 m[IU]/mL (ref >9.9)

## 2022-02-10 NOTE — Telephone Encounter (Signed)
Transition of care contact from inpatient facility ? ?Date of discharge: 02/08/22 ?Date of contact: 02/10/22 ?Method: Phone ?Spoke to: Patient ? ?Patient contacted to discuss transition of care from recent inpatient hospitalization. Patient was admitted to [facility] from 3/01-10/2022 .. with discharge diagnosis of SIRS 2/2 MRSA bacteremia, Hyperkalemia,Ht , Volume overload  ? ?Medication changes were reviewed. Need to start Losartan new at dc (not done yet) ? ?Patient will follow up with his/her outpatient HD unit on: tue 02/11/22 ?  ?

## 2022-02-11 ENCOUNTER — Other Ambulatory Visit (HOSPITAL_COMMUNITY): Payer: Self-pay

## 2022-02-11 DIAGNOSIS — R52 Pain, unspecified: Secondary | ICD-10-CM | POA: Diagnosis not present

## 2022-02-11 DIAGNOSIS — D509 Iron deficiency anemia, unspecified: Secondary | ICD-10-CM | POA: Diagnosis not present

## 2022-02-11 DIAGNOSIS — Z5181 Encounter for therapeutic drug level monitoring: Secondary | ICD-10-CM | POA: Diagnosis not present

## 2022-02-11 DIAGNOSIS — N186 End stage renal disease: Secondary | ICD-10-CM | POA: Diagnosis not present

## 2022-02-11 DIAGNOSIS — D649 Anemia, unspecified: Secondary | ICD-10-CM | POA: Diagnosis not present

## 2022-02-11 DIAGNOSIS — R7881 Bacteremia: Secondary | ICD-10-CM | POA: Diagnosis not present

## 2022-02-11 DIAGNOSIS — N2581 Secondary hyperparathyroidism of renal origin: Secondary | ICD-10-CM | POA: Diagnosis not present

## 2022-02-11 DIAGNOSIS — Z7689 Persons encountering health services in other specified circumstances: Secondary | ICD-10-CM | POA: Diagnosis not present

## 2022-02-11 DIAGNOSIS — T8249XD Other complication of vascular dialysis catheter, subsequent encounter: Secondary | ICD-10-CM | POA: Diagnosis not present

## 2022-02-11 DIAGNOSIS — D689 Coagulation defect, unspecified: Secondary | ICD-10-CM | POA: Diagnosis not present

## 2022-02-11 DIAGNOSIS — Z992 Dependence on renal dialysis: Secondary | ICD-10-CM | POA: Diagnosis not present

## 2022-02-12 ENCOUNTER — Ambulatory Visit: Payer: Medicare Other | Attending: Nurse Practitioner | Admitting: Nurse Practitioner

## 2022-02-12 ENCOUNTER — Encounter: Payer: Self-pay | Admitting: Nurse Practitioner

## 2022-02-12 ENCOUNTER — Other Ambulatory Visit: Payer: Self-pay

## 2022-02-12 VITALS — BP 207/135 | HR 88 | Resp 16 | Wt 177.4 lb

## 2022-02-12 DIAGNOSIS — M3212 Pericarditis in systemic lupus erythematosus: Secondary | ICD-10-CM

## 2022-02-12 DIAGNOSIS — Z09 Encounter for follow-up examination after completed treatment for conditions other than malignant neoplasm: Secondary | ICD-10-CM | POA: Diagnosis not present

## 2022-02-12 NOTE — Progress Notes (Signed)
? ?Assessment & Plan:  ?Hser was seen today for hospitalization follow-up and generalized body aches. ? ?Diagnoses and all orders for this visit: ? ?Hospital discharge follow-up ?Instructed to go to the Emergency room due to malignant HTN ?I also offered non emergency transport which she refused ? ?Systemic lupus erythematosus (SLE) with pericarditis, unspecified SLE type ([place]) ?-     Ambulatory referral to Rheumatology ? ? ? ?Patient has been counseled on age-appropriate routine health concerns for screening and prevention. These are reviewed and up-to-date. Referrals have been placed accordingly. Immunizations are up-to-date or declined.    ?Subjective:  ? ?Chief Complaint  ?Patient presents with  ? Hospitalization Follow-up  ? Generalized Body Aches  ?  All over ?Pain scale 10 out of 10  ? ?HPI ?Cathy [name] 41 y.o. 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 ? ?Admitted 01-29-2022 through 02-08-2022 Discharged after a 2week IP hospital stay for SEPSIS due to MRSA bacteremia.([place]) She will need AVF placement through vascular surgery at some point. Blood cultures negative upon discharge.   Hospital course was complicated by  ? ?She is currently only taking clonidine 0.3 mg TID but has not picked up any additional blood pressure medications.  ?Current medications include: losartan, Coreg, amlodipine, and hydralazine   ?Today she is endorsing weakness and fatigue. Denies chest pain or shortness of breath although she does have 2+ nonpitting edema. Blood pressure is very elevated. I have requested to call non emergent EMS for her to go to the hospital for further treatment. She adamantly refuses. States she had HD yesterday and they will pull more fluid off tomorrow with HD. I am  concerned that she needs IV lasix due to history of chronic systolic heart failure. She also missed her cardiology appointment which she states was due to her HD appt.  ?BP Readings from Last 3 Encounters:  ?02/12/22 (!) 207/135  ?02/08/22 (!) 180/94  ?01/09/22 (!) 128/92  ?  ? ?Review of Systems  ?Constitutional:  Positive for malaise/fatigue. Negative for fever and weight loss.  ?HENT: Negative.  Negative for nosebleeds.   ?Eyes: Negative.  Negative for blurred vision, double vision and photophobia.  ?Respiratory: Negative.  Negative for cough and shortness of breath.   ?Cardiovascular:  Positive for leg swelling. Negative for chest pain and palpitations.  ?Gastrointestinal: Negative.  Negative for heartburn, nausea and vomiting.  ?Musculoskeletal: Negative.  Negative for myalgias.  ?Neurological: Negative.  Negative for dizziness, focal weakness, seizures and headaches.  ?Psychiatric/Behavioral: Negative.  Negative for suicidal ideas.   ? ?Past Medical History:  ?Diagnosis Date  ? Anasarca associated with disorder of kidney 06/08/2021  ? Anxiety   ? Asthma   ? Bipolar depression ([place])   ? Depression   ? Dyspnea   ? ESRD (end stage renal disease) on dialysis ([place]) 10/2020  ? GERD (gastroesophageal reflux disease)   ? Gonorrhea   ? Lupus ([place])   ? Pelvic inflammatory disease (PID)   ? Pleural effusion 06/08/2021  ? Renal hypertension   ? Trichomonas infection   ? ? ?Past Surgical History:  ?Procedure Laterality Date  ? IR FLUORO GUIDE CV LINE RIGHT  11/30/2020  ? IR FLUORO GUIDE CV LINE RIGHT  06/13/2021  ? IR FLUORO GUIDE CV LINE RIGHT  02/01/2022  ?  IR REMOVAL TUN CV CATH W/O FL  06/11/2021  ? IR REMOVAL TUN CV CATH W/O FL  01/29/2022  ? IR US GUIDE VASC ACCESS RIGHT  11/30/2020  ? IR US GUIDE VASC ACCESS RIGHT  06/13/2021  ? IR US GUIDE VASC ACCESS RIGHT  02/03/2022  ? RENAL BIOPSY    ? TEE WITHOUT CARDIOVERSION N/A 06/13/2021  ? Procedure: TRANSESOPHAGEAL ECHOCARDIOGRAM (TEE);  Surgeon: Josue Hector, MD;  [facility]  ENDOSCOPY;  Service: Cardiovascular;  Laterality: N/A;  ? TEE WITHOUT CARDIOVERSION N/A 02/03/2022  ? Procedure: TRANSESOPHAGEAL ECHOCARDIOGRAM (TEE);  Surgeon: Berniece Salines, DO;  Location: [facility];  Service: Cardiovascular;  Laterality: N/A;  ? TUBAL LIGATION N/A 02/03/2019  ? Procedure: POST PARTUM TUBAL LIGATION;  Surgeon: Aletha Halim, MD;  Location: [facility] LD ORS;  Service: Gynecology;  Laterality: N/A;  ? ? ?Family History  ?Problem Relation Age of Onset  ? Diabetes Maternal Grandmother   ? Hypertension Maternal Grandmother   ? Diabetes Maternal Grandfather   ? Hypertension Maternal Grandfather   ? Diabetes Paternal Grandmother   ? Hypertension Paternal Grandmother   ? Diabetes Paternal Grandfather   ? Hypertension Paternal Grandfather   ? ? ?Social History Reviewed with no changes to be made today.  ? ?Outpatient Medications Prior to Visit  ?Medication Sig Dispense Refill  ? acetaminophen (TYLENOL) 500 MG tablet Take 1,000 mg by mouth every 6 (six) hours as needed for moderate pain.    ? albuterol (VENTOLIN HFA) 108 (90 Base) MCG/ACT inhaler Inhale 2 puffs into the lungs every 6 (six) hours as needed for wheezing or shortness of breath. 8 g 1  ? amLODipine (NORVASC) 10 MG tablet Take 1 tablet (10 mg total) by mouth daily. 30 tablet 1  ? apixaban (ELIQUIS) 5 MG TABS tablet Take 1 tablet (5 mg total) by mouth 2 (two) times daily. 60 tablet 1  ? Blood Pressure Monitoring (OMRON 3 SERIES BP MONITOR) DEVI Use as directed 2 times a day 1 each 0  ? calcium acetate (PHOSLO) 667 MG capsule Take 2 capsules (1,334 mg total) by mouth 3 (three) times daily with meals. 180 capsule 0  ? carvedilol (COREG) 25 MG tablet Take 1 tablet (25 mg total) by mouth 2 (two) times daily with a meal. 60 tablet 3  ? cloNIDine (CATAPRES) 0.3 MG tablet Take 1 tablet (0.3 mg total) by mouth 3 (three) times daily. 90 tablet 1  ? colchicine 0.6 MG tablet Take 0.5 tablets (0.3 mg total) by mouth daily. 30 tablet 0  ? guaiFENesin (MUCINEX) 600  MG 12 hr tablet Take 600 mg by mouth 2 (two) times daily as needed for cough.    ? hydrALAZINE (APRESOLINE) 100 MG tablet Take 1 tablet (100 mg total) by mouth 3 (three) times daily. 90 tablet 1  ? hydroxychloroquine (PLAQUENIL) 200 MG tablet Take 1/2 tablet (100 mg total) by mouth every other day after hemodialysis 30 tablet 0  ? hydrOXYzine (ATARAX) 25 MG tablet Take 1 tablet (25 mg total) by mouth 3 (three) times daily as needed for itching or anxiety. 30 tablet 0  ? ipratropium (ATROVENT HFA) 17 MCG/ACT inhaler Inhale 2 puffs into the lungs every 6 (six) hours as needed for wheezing. 1 each 12  ? loratadine (CLARITIN) 10 MG tablet Take 10 mg by mouth daily.    ? losartan (COZAAR) 50 MG tablet Take 1 tablet (50 mg total) by mouth at bedtime. 30 tablet 3  ? nicotine (NICODERM CQ - DOSED IN  MG/24 HOURS) 21 mg/24hr patch Place 1 patch (21 mg total) onto the skin daily. (Patient not taking: Reported on 01/31/2022) 28 patch 0  ? oxyCODONE (OXY IR/ROXICODONE) 5 MG immediate release tablet Take 1 tablet (5 mg total) by mouth every 6 (six) hours as needed for severe pain. (Patient not taking: Reported on 01/31/2022) 10 tablet 0  ? predniSONE (DELTASONE) 20 MG tablet Take 2 tablets (40 mg total) by mouth daily with breakfast. (Patient not taking: Reported on 02/12/2022) 60 tablet 0  ? vancomycin (VANCOREADY) 750 MG/150ML SOLN Inject 150 mLs (750 mg total) into the vein Every Tuesday,Thursday,and Saturday with dialysis for 24 days.    ? ?No facility-administered medications prior to visit.  ? ? ?Allergies  ?Allergen Reactions  ? Oxycodone Itching  ? ? ?   ?Objective:  ?  ?BP (!) 207/135 (BP Location: Left Arm, Patient Position: Sitting, Cuff Size: Small)   Pulse 88   Resp 16   Wt 177 lb 6.4 oz (80.5 kg)   SpO2 99%   BMI 27.78 kg/m?  ?Wt Readings from Last 3 Encounters:  ?02/12/22 177 lb 6.4 oz (80.5 kg)  ?02/08/22 151 lb 3.8 oz (68.6 kg)  ?01/09/22 135 lb 12.9 oz (61.6 kg)  ? ? ?Physical Exam ?Vitals and nursing note  reviewed.  ?Constitutional:   ?   Appearance: She is well-developed.  ?HENT:  ?   Head: Normocephalic and atraumatic.  ?Cardiovascular:  ?   Rate and Rhythm: Normal rate and regular rhythm.  ?   Heart sounds:

## 2022-02-13 ENCOUNTER — Encounter: Payer: Self-pay | Admitting: Nurse Practitioner

## 2022-02-13 ENCOUNTER — Encounter: Payer: Medicare Other | Admitting: Physician Assistant

## 2022-02-13 NOTE — Progress Notes (Signed)
This encounter was created in error - please disregard.

## 2022-02-14 ENCOUNTER — Ambulatory Visit: Payer: Medicare Other | Admitting: Cardiovascular Disease

## 2022-02-15 DIAGNOSIS — Z5181 Encounter for therapeutic drug level monitoring: Secondary | ICD-10-CM | POA: Diagnosis not present

## 2022-02-15 DIAGNOSIS — N2581 Secondary hyperparathyroidism of renal origin: Secondary | ICD-10-CM | POA: Diagnosis not present

## 2022-02-15 DIAGNOSIS — D649 Anemia, unspecified: Secondary | ICD-10-CM | POA: Diagnosis not present

## 2022-02-15 DIAGNOSIS — Z992 Dependence on renal dialysis: Secondary | ICD-10-CM | POA: Diagnosis not present

## 2022-02-15 DIAGNOSIS — Z7689 Persons encountering health services in other specified circumstances: Secondary | ICD-10-CM | POA: Diagnosis not present

## 2022-02-15 DIAGNOSIS — R52 Pain, unspecified: Secondary | ICD-10-CM | POA: Diagnosis not present

## 2022-02-15 DIAGNOSIS — R7881 Bacteremia: Secondary | ICD-10-CM | POA: Diagnosis not present

## 2022-02-15 DIAGNOSIS — D509 Iron deficiency anemia, unspecified: Secondary | ICD-10-CM | POA: Diagnosis not present

## 2022-02-15 DIAGNOSIS — T8249XD Other complication of vascular dialysis catheter, subsequent encounter: Secondary | ICD-10-CM | POA: Diagnosis not present

## 2022-02-15 DIAGNOSIS — D689 Coagulation defect, unspecified: Secondary | ICD-10-CM | POA: Diagnosis not present

## 2022-02-15 DIAGNOSIS — N186 End stage renal disease: Secondary | ICD-10-CM | POA: Diagnosis not present

## 2022-02-18 DIAGNOSIS — Z5181 Encounter for therapeutic drug level monitoring: Secondary | ICD-10-CM | POA: Diagnosis not present

## 2022-02-18 DIAGNOSIS — D649 Anemia, unspecified: Secondary | ICD-10-CM | POA: Diagnosis not present

## 2022-02-18 DIAGNOSIS — R52 Pain, unspecified: Secondary | ICD-10-CM | POA: Diagnosis not present

## 2022-02-18 DIAGNOSIS — D689 Coagulation defect, unspecified: Secondary | ICD-10-CM | POA: Diagnosis not present

## 2022-02-18 DIAGNOSIS — D509 Iron deficiency anemia, unspecified: Secondary | ICD-10-CM | POA: Diagnosis not present

## 2022-02-18 DIAGNOSIS — N186 End stage renal disease: Secondary | ICD-10-CM | POA: Diagnosis not present

## 2022-02-18 DIAGNOSIS — R7881 Bacteremia: Secondary | ICD-10-CM | POA: Diagnosis not present

## 2022-02-18 DIAGNOSIS — Z7689 Persons encountering health services in other specified circumstances: Secondary | ICD-10-CM | POA: Diagnosis not present

## 2022-02-18 DIAGNOSIS — Z992 Dependence on renal dialysis: Secondary | ICD-10-CM | POA: Diagnosis not present

## 2022-02-18 DIAGNOSIS — T8249XD Other complication of vascular dialysis catheter, subsequent encounter: Secondary | ICD-10-CM | POA: Diagnosis not present

## 2022-02-18 DIAGNOSIS — N2581 Secondary hyperparathyroidism of renal origin: Secondary | ICD-10-CM | POA: Diagnosis not present

## 2022-02-20 ENCOUNTER — Encounter: Payer: Self-pay | Admitting: Physician Assistant

## 2022-02-20 DIAGNOSIS — Z7689 Persons encountering health services in other specified circumstances: Secondary | ICD-10-CM | POA: Diagnosis not present

## 2022-02-21 ENCOUNTER — Other Ambulatory Visit (HOSPITAL_COMMUNITY): Payer: Self-pay

## 2022-02-22 DIAGNOSIS — N186 End stage renal disease: Secondary | ICD-10-CM | POA: Diagnosis not present

## 2022-02-22 DIAGNOSIS — Z7689 Persons encountering health services in other specified circumstances: Secondary | ICD-10-CM | POA: Diagnosis not present

## 2022-02-22 DIAGNOSIS — R7881 Bacteremia: Secondary | ICD-10-CM | POA: Diagnosis not present

## 2022-02-22 DIAGNOSIS — R52 Pain, unspecified: Secondary | ICD-10-CM | POA: Diagnosis not present

## 2022-02-22 DIAGNOSIS — T8249XD Other complication of vascular dialysis catheter, subsequent encounter: Secondary | ICD-10-CM | POA: Diagnosis not present

## 2022-02-22 DIAGNOSIS — D689 Coagulation defect, unspecified: Secondary | ICD-10-CM | POA: Diagnosis not present

## 2022-02-22 DIAGNOSIS — N2581 Secondary hyperparathyroidism of renal origin: Secondary | ICD-10-CM | POA: Diagnosis not present

## 2022-02-22 DIAGNOSIS — D509 Iron deficiency anemia, unspecified: Secondary | ICD-10-CM | POA: Diagnosis not present

## 2022-02-22 DIAGNOSIS — Z5181 Encounter for therapeutic drug level monitoring: Secondary | ICD-10-CM | POA: Diagnosis not present

## 2022-02-22 DIAGNOSIS — Z992 Dependence on renal dialysis: Secondary | ICD-10-CM | POA: Diagnosis not present

## 2022-02-22 DIAGNOSIS — D649 Anemia, unspecified: Secondary | ICD-10-CM | POA: Diagnosis not present

## 2022-02-25 DIAGNOSIS — Z7689 Persons encountering health services in other specified circumstances: Secondary | ICD-10-CM | POA: Diagnosis not present

## 2022-02-27 DIAGNOSIS — Z7689 Persons encountering health services in other specified circumstances: Secondary | ICD-10-CM | POA: Diagnosis not present

## 2022-02-28 ENCOUNTER — Inpatient Hospital Stay: Payer: Medicare Other | Admitting: Infectious Diseases

## 2022-02-28 DIAGNOSIS — Z992 Dependence on renal dialysis: Secondary | ICD-10-CM | POA: Diagnosis not present

## 2022-02-28 DIAGNOSIS — N186 End stage renal disease: Secondary | ICD-10-CM | POA: Diagnosis not present

## 2022-02-28 DIAGNOSIS — M3214 Glomerular disease in systemic lupus erythematosus: Secondary | ICD-10-CM | POA: Diagnosis not present

## 2022-03-01 ENCOUNTER — Inpatient Hospital Stay (HOSPITAL_COMMUNITY)
Admission: EM | Admit: 2022-03-01 | Discharge: 2022-03-05 | DRG: 640 | Disposition: A | Discharge status: Home / Self Care | Payer: Medicare Other | Attending: Internal Medicine | Admitting: Internal Medicine

## 2022-03-01 ENCOUNTER — Emergency Department (HOSPITAL_COMMUNITY): Payer: Medicare Other

## 2022-03-01 ENCOUNTER — Other Ambulatory Visit: Payer: Self-pay

## 2022-03-01 ENCOUNTER — Encounter (HOSPITAL_COMMUNITY): Payer: Self-pay

## 2022-03-01 DIAGNOSIS — M898X9 Other specified disorders of bone, unspecified site: Secondary | ICD-10-CM | POA: Diagnosis present

## 2022-03-01 DIAGNOSIS — Z8249 Family history of ischemic heart disease and other diseases of the circulatory system: Secondary | ICD-10-CM

## 2022-03-01 DIAGNOSIS — Z86711 Personal history of pulmonary embolism: Secondary | ICD-10-CM

## 2022-03-01 DIAGNOSIS — N186 End stage renal disease: Secondary | ICD-10-CM

## 2022-03-01 DIAGNOSIS — K219 Gastro-esophageal reflux disease without esophagitis: Secondary | ICD-10-CM | POA: Diagnosis not present

## 2022-03-01 DIAGNOSIS — M3214 Glomerular disease in systemic lupus erythematosus: Secondary | ICD-10-CM | POA: Diagnosis present

## 2022-03-01 DIAGNOSIS — F17210 Nicotine dependence, cigarettes, uncomplicated: Secondary | ICD-10-CM | POA: Diagnosis not present

## 2022-03-01 DIAGNOSIS — I160 Hypertensive urgency: Secondary | ICD-10-CM | POA: Diagnosis not present

## 2022-03-01 DIAGNOSIS — D631 Anemia in chronic kidney disease: Secondary | ICD-10-CM | POA: Diagnosis not present

## 2022-03-01 DIAGNOSIS — Z91128 Patient's intentional underdosing of medication regimen for other reason: Secondary | ICD-10-CM | POA: Diagnosis not present

## 2022-03-01 DIAGNOSIS — Z91148 Patient's other noncompliance with medication regimen for other reason: Secondary | ICD-10-CM | POA: Diagnosis not present

## 2022-03-01 DIAGNOSIS — R188 Other ascites: Secondary | ICD-10-CM | POA: Diagnosis not present

## 2022-03-01 DIAGNOSIS — I3139 Other pericardial effusion (noninflammatory): Secondary | ICD-10-CM | POA: Diagnosis not present

## 2022-03-01 DIAGNOSIS — J45909 Unspecified asthma, uncomplicated: Secondary | ICD-10-CM | POA: Diagnosis present

## 2022-03-01 DIAGNOSIS — F17200 Nicotine dependence, unspecified, uncomplicated: Secondary | ICD-10-CM

## 2022-03-01 DIAGNOSIS — E8770 Fluid overload, unspecified: Principal | ICD-10-CM | POA: Diagnosis present

## 2022-03-01 DIAGNOSIS — Z992 Dependence on renal dialysis: Secondary | ICD-10-CM

## 2022-03-01 DIAGNOSIS — Z91158 Patient's noncompliance with renal dialysis for other reason: Secondary | ICD-10-CM | POA: Diagnosis not present

## 2022-03-01 DIAGNOSIS — B9562 Methicillin resistant Staphylococcus aureus infection as the cause of diseases classified elsewhere: Secondary | ICD-10-CM

## 2022-03-01 DIAGNOSIS — I120 Hypertensive chronic kidney disease with stage 5 chronic kidney disease or end stage renal disease: Secondary | ICD-10-CM | POA: Diagnosis not present

## 2022-03-01 DIAGNOSIS — Z8616 Personal history of COVID-19: Secondary | ICD-10-CM

## 2022-03-01 DIAGNOSIS — I5020 Unspecified systolic (congestive) heart failure: Secondary | ICD-10-CM | POA: Diagnosis not present

## 2022-03-01 DIAGNOSIS — I700 Atherosclerosis of aorta: Secondary | ICD-10-CM | POA: Diagnosis present

## 2022-03-01 DIAGNOSIS — Z7951 Long term (current) use of inhaled steroids: Secondary | ICD-10-CM

## 2022-03-01 DIAGNOSIS — M3212 Pericarditis in systemic lupus erythematosus: Secondary | ICD-10-CM | POA: Diagnosis not present

## 2022-03-01 DIAGNOSIS — R140 Abdominal distension (gaseous): Secondary | ICD-10-CM | POA: Diagnosis not present

## 2022-03-01 DIAGNOSIS — Z7901 Long term (current) use of anticoagulants: Secondary | ICD-10-CM

## 2022-03-01 DIAGNOSIS — Z79899 Other long term (current) drug therapy: Secondary | ICD-10-CM

## 2022-03-01 DIAGNOSIS — T368X6A Underdosing of other systemic antibiotics, initial encounter: Secondary | ICD-10-CM | POA: Diagnosis not present

## 2022-03-01 DIAGNOSIS — Z7952 Long term (current) use of systemic steroids: Secondary | ICD-10-CM

## 2022-03-01 DIAGNOSIS — I10 Essential (primary) hypertension: Secondary | ICD-10-CM | POA: Diagnosis not present

## 2022-03-01 DIAGNOSIS — Z7689 Persons encountering health services in other specified circumstances: Secondary | ICD-10-CM | POA: Diagnosis not present

## 2022-03-01 DIAGNOSIS — R7881 Bacteremia: Secondary | ICD-10-CM

## 2022-03-01 DIAGNOSIS — Z9851 Tubal ligation status: Secondary | ICD-10-CM

## 2022-03-01 DIAGNOSIS — Z833 Family history of diabetes mellitus: Secondary | ICD-10-CM

## 2022-03-01 DIAGNOSIS — Z20822 Contact with and (suspected) exposure to covid-19: Secondary | ICD-10-CM | POA: Diagnosis present

## 2022-03-01 DIAGNOSIS — I132 Hypertensive heart and chronic kidney disease with heart failure and with stage 5 chronic kidney disease, or end stage renal disease: Secondary | ICD-10-CM | POA: Diagnosis not present

## 2022-03-01 DIAGNOSIS — J4520 Mild intermittent asthma, uncomplicated: Secondary | ICD-10-CM

## 2022-03-01 DIAGNOSIS — Z91199 Patient's noncompliance with other medical treatment and regimen due to unspecified reason: Secondary | ICD-10-CM

## 2022-03-01 DIAGNOSIS — R109 Unspecified abdominal pain: Secondary | ICD-10-CM | POA: Diagnosis not present

## 2022-03-01 DIAGNOSIS — J90 Pleural effusion, not elsewhere classified: Secondary | ICD-10-CM | POA: Diagnosis not present

## 2022-03-01 DIAGNOSIS — Z91119 Patient's noncompliance with dietary regimen due to unspecified reason: Secondary | ICD-10-CM

## 2022-03-01 DIAGNOSIS — Y92009 Unspecified place in unspecified non-institutional (private) residence as the place of occurrence of the external cause: Secondary | ICD-10-CM

## 2022-03-01 DIAGNOSIS — I5022 Chronic systolic (congestive) heart failure: Secondary | ICD-10-CM | POA: Diagnosis present

## 2022-03-01 DIAGNOSIS — F1910 Other psychoactive substance abuse, uncomplicated: Secondary | ICD-10-CM | POA: Diagnosis present

## 2022-03-01 DIAGNOSIS — I2699 Other pulmonary embolism without acute cor pulmonale: Secondary | ICD-10-CM | POA: Diagnosis present

## 2022-03-01 DIAGNOSIS — Z720 Tobacco use: Secondary | ICD-10-CM | POA: Diagnosis present

## 2022-03-01 DIAGNOSIS — R531 Weakness: Secondary | ICD-10-CM | POA: Diagnosis not present

## 2022-03-01 DIAGNOSIS — R0602 Shortness of breath: Secondary | ICD-10-CM | POA: Diagnosis not present

## 2022-03-01 DIAGNOSIS — Z885 Allergy status to narcotic agent status: Secondary | ICD-10-CM

## 2022-03-01 LAB — COMPREHENSIVE METABOLIC PANEL
ALT: 12 U/L (ref 0–44)
AST: 22 U/L (ref 15–41)
Albumin: 3.3 g/dL — ABNORMAL LOW (ref 3.5–5.0)
Alkaline Phosphatase: 288 U/L — ABNORMAL HIGH (ref 38–126)
Anion gap: 18 — ABNORMAL HIGH (ref 5–15)
BUN: 67 mg/dL — ABNORMAL HIGH (ref 6–20)
CO2: 22 mmol/L (ref 22–32)
Calcium: 8.8 mg/dL — ABNORMAL LOW (ref 8.9–10.3)
Chloride: 96 mmol/L — ABNORMAL LOW (ref 98–111)
Creatinine, Ser: 14.32 mg/dL — ABNORMAL HIGH (ref 0.44–1.00)
GFR, Estimated: 3 mL/min — ABNORMAL LOW (ref >60)
Glucose, Bld: 90 mg/dL (ref 70–99)
Potassium: 4.9 mmol/L (ref 3.5–5.1)
Sodium: 136 mmol/L (ref 135–145)
Total Bilirubin: 0.9 mg/dL (ref 0.3–1.2)
Total Protein: 7 g/dL (ref 6.5–8.1)

## 2022-03-01 LAB — CBC WITH DIFFERENTIAL/PLATELET
Abs Immature Granulocytes: 0.07 10*3/uL (ref 0.00–0.07)
Basophils Absolute: 0.1 10*3/uL (ref 0.0–0.1)
Basophils Relative: 1 %
Eosinophils Absolute: 0.1 10*3/uL (ref 0.0–0.5)
Eosinophils Relative: 1 %
HCT: 39.8 % (ref 36.0–46.0)
Hemoglobin: 12 g/dL (ref 12.0–15.0)
Immature Granulocytes: 1 %
Lymphocytes Relative: 21 %
Lymphs Abs: 1.7 10*3/uL (ref 0.7–4.0)
MCH: 27.8 pg (ref 26.0–34.0)
MCHC: 30.2 g/dL (ref 30.0–36.0)
MCV: 92.1 fL (ref 80.0–100.0)
Monocytes Absolute: 0.7 10*3/uL (ref 0.1–1.0)
Monocytes Relative: 9 %
Neutro Abs: 5.3 10*3/uL (ref 1.7–7.7)
Neutrophils Relative %: 67 %
Platelets: 272 10*3/uL (ref 150–400)
RBC: 4.32 MIL/uL (ref 3.87–5.11)
RDW: 20.6 % — ABNORMAL HIGH (ref 11.5–15.5)
WBC: 7.9 10*3/uL (ref 4.0–10.5)
nRBC: 0 % (ref 0.0–0.2)

## 2022-03-01 LAB — PROTIME-INR
INR: 1.8 — ABNORMAL HIGH (ref 0.8–1.2)
Prothrombin Time: 21.1 seconds — ABNORMAL HIGH (ref 11.4–15.2)

## 2022-03-01 LAB — TROPONIN I (HIGH SENSITIVITY)
Troponin I (High Sensitivity): 74 ng/L — ABNORMAL HIGH (ref <18)
Troponin I (High Sensitivity): 80 ng/L — ABNORMAL HIGH (ref <18)

## 2022-03-01 LAB — RESP PANEL BY RT-PCR (FLU A&B, COVID) ARPGX2
Influenza A by PCR: NEGATIVE
Influenza B by PCR: NEGATIVE
SARS Coronavirus 2 by RT PCR: NEGATIVE

## 2022-03-01 LAB — LIPASE, BLOOD: Lipase: 28 U/L (ref 11–51)

## 2022-03-01 LAB — LACTIC ACID, PLASMA: Lactic Acid, Venous: 1.3 mmol/L (ref 0.5–1.9)

## 2022-03-01 LAB — APTT: aPTT: 39 seconds — ABNORMAL HIGH (ref 24–36)

## 2022-03-01 LAB — BRAIN NATRIURETIC PEPTIDE: B Natriuretic Peptide: >4500 pg/mL — ABNORMAL HIGH (ref 0.0–100.0)

## 2022-03-01 MED ORDER — VANCOMYCIN HCL 750 MG/150ML IV SOLN
750.0000 mg | INTRAVENOUS | Status: DC
  Administered 2022-03-01 – 2022-03-04 (×2): 750 mg via INTRAVENOUS
  Filled 2022-03-01 (×3): qty 150

## 2022-03-01 MED ORDER — ACETAMINOPHEN 650 MG RE SUPP: 650.0000 mg | Freq: Four times a day (QID) | RECTAL | Status: DC | PRN

## 2022-03-01 MED ORDER — VANCOMYCIN HCL 750 MG/150ML IV SOLN
750.0000 mg | INTRAVENOUS | Status: DC
  Filled 2022-03-01: qty 150

## 2022-03-01 MED ORDER — HEPARIN SODIUM (PORCINE) 5000 UNIT/ML IJ SOLN: 5000.0000 [IU] | Freq: Three times a day (TID) | INTRAMUSCULAR | Status: DC

## 2022-03-01 MED ORDER — ACETAMINOPHEN 325 MG PO TABS: 650.0000 mg | ORAL_TABLET | Freq: Four times a day (QID) | ORAL | Status: DC | PRN

## 2022-03-01 MED ORDER — CARVEDILOL 12.5 MG PO TABS
25.0000 mg | ORAL_TABLET | Freq: Two times a day (BID) | ORAL | Status: DC
  Administered 2022-03-01 – 2022-03-05 (×9): 25 mg via ORAL
  Filled 2022-03-01 (×10): qty 2

## 2022-03-01 MED ORDER — HYDROXYCHLOROQUINE SULFATE 200 MG PO TABS
100.0000 mg | ORAL_TABLET | ORAL | Status: DC
  Filled 2022-03-01: qty 0.5

## 2022-03-01 MED ORDER — CALCIUM ACETATE (PHOS BINDER) 667 MG PO CAPS
1334.0000 mg | ORAL_CAPSULE | Freq: Three times a day (TID) | ORAL | Status: DC
  Administered 2022-03-01 – 2022-03-05 (×11): 1334 mg via ORAL
  Filled 2022-03-01 (×11): qty 2

## 2022-03-01 MED ORDER — HYDROXYCHLOROQUINE SULFATE 200 MG PO TABS
100.0000 mg | ORAL_TABLET | ORAL | Status: DC
  Administered 2022-03-04: 100 mg via ORAL
  Filled 2022-03-01: qty 0.5
  Filled 2022-03-01 (×2): qty 1

## 2022-03-01 MED ORDER — ONDANSETRON 4 MG PO TBDP
4.0000 mg | ORAL_TABLET | Freq: Once | ORAL | Status: AC
  Administered 2022-03-01: 4 mg via ORAL
  Filled 2022-03-01: qty 1

## 2022-03-01 MED ORDER — ONDANSETRON HCL 4 MG PO TABS
4.0000 mg | ORAL_TABLET | Freq: Four times a day (QID) | ORAL | Status: DC | PRN
  Administered 2022-03-02: 4 mg via ORAL
  Filled 2022-03-01: qty 1

## 2022-03-01 MED ORDER — OXYCODONE HCL 5 MG PO TABS: 5.0000 mg | ORAL_TABLET | Freq: Four times a day (QID) | ORAL | Status: DC | PRN

## 2022-03-01 MED ORDER — LOSARTAN POTASSIUM 50 MG PO TABS
50.0000 mg | ORAL_TABLET | Freq: Every day | ORAL | Status: DC
  Administered 2022-03-02 – 2022-03-03 (×2): 50 mg via ORAL
  Filled 2022-03-01 (×2): qty 1

## 2022-03-01 MED ORDER — APIXABAN 5 MG PO TABS
5.0000 mg | ORAL_TABLET | Freq: Two times a day (BID) | ORAL | Status: DC
Start: 2022-03-01 — End: 2022-03-02
  Administered 2022-03-02 (×2): 5 mg via ORAL
  Filled 2022-03-01 (×2): qty 1

## 2022-03-01 MED ORDER — HEPARIN SODIUM (PORCINE) 1000 UNIT/ML DIALYSIS
20.0000 [IU]/kg | INTRAMUSCULAR | Status: DC | PRN
  Administered 2022-03-01: 1500 [IU] via INTRAVENOUS_CENTRAL

## 2022-03-01 MED ORDER — CARVEDILOL 12.5 MG PO TABS: 25.0000 mg | ORAL_TABLET | Freq: Two times a day (BID) | ORAL | Status: DC

## 2022-03-01 MED ORDER — HEPARIN SODIUM (PORCINE) 1000 UNIT/ML DIALYSIS
1000.0000 [IU] | INTRAMUSCULAR | Status: DC | PRN
  Administered 2022-03-02 – 2022-03-04 (×2): 3200 [IU] via INTRAVENOUS_CENTRAL

## 2022-03-01 MED ORDER — PREDNISONE 20 MG PO TABS
20.0000 mg | ORAL_TABLET | Freq: Every day | ORAL | Status: DC
  Administered 2022-03-02 – 2022-03-05 (×4): 20 mg via ORAL
  Filled 2022-03-01 (×4): qty 1

## 2022-03-01 MED ORDER — NICOTINE 21 MG/24HR TD PT24
21.0000 mg | MEDICATED_PATCH | Freq: Every day | TRANSDERMAL | Status: DC
  Administered 2022-03-04: 21 mg via TRANSDERMAL
  Filled 2022-03-01 (×5): qty 1

## 2022-03-01 MED ORDER — AMLODIPINE BESYLATE 5 MG PO TABS
10.0000 mg | ORAL_TABLET | Freq: Once | ORAL | Status: AC
  Administered 2022-03-01: 10 mg via ORAL
  Filled 2022-03-01: qty 2

## 2022-03-01 MED ORDER — HYDRALAZINE HCL 25 MG PO TABS
100.0000 mg | ORAL_TABLET | Freq: Once | ORAL | Status: AC
  Administered 2022-03-01: 100 mg via ORAL
  Filled 2022-03-01: qty 4

## 2022-03-01 MED ORDER — SODIUM CHLORIDE 0.9 % IV SOLN: 100.0000 mL | INTRAVENOUS | Status: DC | PRN

## 2022-03-01 MED ORDER — AMLODIPINE BESYLATE 5 MG PO TABS
10.0000 mg | ORAL_TABLET | Freq: Every day | ORAL | Status: DC
  Administered 2022-03-02 – 2022-03-05 (×4): 10 mg via ORAL
  Filled 2022-03-01 (×4): qty 2

## 2022-03-01 MED ORDER — BISACODYL 5 MG PO TBEC: 5.0000 mg | DELAYED_RELEASE_TABLET | Freq: Every day | ORAL | Status: DC | PRN

## 2022-03-01 MED ORDER — ALTEPLASE 2 MG IJ SOLR: 2.0000 mg | Freq: Once | INTRAMUSCULAR | Status: DC | PRN

## 2022-03-01 MED ORDER — CLONIDINE HCL 0.1 MG PO TABS
0.1000 mg | ORAL_TABLET | Freq: Once | ORAL | Status: AC
  Administered 2022-03-01: 0.1 mg via ORAL
  Filled 2022-03-01: qty 1

## 2022-03-01 MED ORDER — ONDANSETRON HCL 4 MG/2ML IJ SOLN
4.0000 mg | Freq: Four times a day (QID) | INTRAMUSCULAR | Status: DC | PRN
  Administered 2022-03-04 – 2022-03-05 (×3): 4 mg via INTRAVENOUS
  Filled 2022-03-01 (×4): qty 2

## 2022-03-01 MED ORDER — OXYCODONE HCL 5 MG PO TABS
5.0000 mg | ORAL_TABLET | Freq: Once | ORAL | Status: AC
  Administered 2022-03-01: 5 mg via ORAL
  Filled 2022-03-01: qty 1

## 2022-03-01 MED ORDER — ALBUTEROL SULFATE (2.5 MG/3ML) 0.083% IN NEBU: 3.0000 mL | INHALATION_SOLUTION | Freq: Four times a day (QID) | RESPIRATORY_TRACT | Status: DC | PRN

## 2022-03-01 MED ORDER — CLONIDINE HCL 0.2 MG PO TABS
0.3000 mg | ORAL_TABLET | Freq: Three times a day (TID) | ORAL | Status: DC
  Administered 2022-03-01 – 2022-03-04 (×9): 0.3 mg via ORAL
  Filled 2022-03-01 (×9): qty 1

## 2022-03-01 MED ORDER — HYDRALAZINE HCL 20 MG/ML IJ SOLN
10.0000 mg | INTRAMUSCULAR | Status: DC | PRN
Start: 2022-03-01 — End: 2022-03-05
  Administered 2022-03-04 – 2022-03-05 (×3): 10 mg via INTRAVENOUS
  Filled 2022-03-01 (×2): qty 1

## 2022-03-01 MED ORDER — FENTANYL CITRATE PF 50 MCG/ML IJ SOSY
12.5000 ug | PREFILLED_SYRINGE | INTRAMUSCULAR | Status: DC | PRN
  Administered 2022-03-01 – 2022-03-04 (×16): 12.5 ug via INTRAVENOUS
  Filled 2022-03-01 (×18): qty 1

## 2022-03-01 MED ORDER — SEVELAMER CARBONATE 800 MG PO TABS
800.0000 mg | ORAL_TABLET | Freq: Three times a day (TID) | ORAL | Status: DC
  Administered 2022-03-01 – 2022-03-05 (×11): 800 mg via ORAL
  Filled 2022-03-01 (×11): qty 1

## 2022-03-01 MED ORDER — CHLORHEXIDINE GLUCONATE CLOTH 2 % EX PADS
6.0000 | MEDICATED_PAD | Freq: Every day | CUTANEOUS | Status: DC
  Administered 2022-03-02 – 2022-03-05 (×3): 6 via TOPICAL

## 2022-03-01 NOTE — ED Notes (Signed)
Patient transported to X-ray 

## 2022-03-01 NOTE — Assessment & Plan Note (Addendum)
--  chronic longstanding problem ?--nephrology following ?-continue plaquenil ?

## 2022-03-01 NOTE — Assessment & Plan Note (Addendum)
--  when blood pressure meds are taken BP seems to be much better controlled ?--continue home meds with recent adjustments made by nephrology  ?

## 2022-03-01 NOTE — Assessment & Plan Note (Addendum)
--  Pt has missed 1 week of dialysis, last reported treatment was 3/25 ?--consult to nephrology for HD treatment while in hospital ?--Pt had HD on 4/1 and 4/4 ?--4/4 HD--4 L removed ?

## 2022-03-01 NOTE — Assessment & Plan Note (Addendum)
--  pt has referral for cardiology follow up ?--It appears that she did not show for the 02/13/22 appt ?--01/30/22 Echo EF 35-40%, global HK ?

## 2022-03-01 NOTE — Assessment & Plan Note (Addendum)
--  temporarily hold further apixaban today for possible paracentesis on 4/3 ?--resume after paracentesis ?--resume apixaban 4/4  ?

## 2022-03-01 NOTE — ED Triage Notes (Signed)
Patient arrived with complaints of SOB and abdominal distension. Patient stated that she has not had dialysis since 03/25. Patient is supposed to go Tu/Th/Sat. Patient requesting oxygen. 2L [place] placed for comfort. Abdomen is distended upon assessment.  ?

## 2022-03-01 NOTE — H&P (Signed)
?History and Physical  ?[place] ? ?Cathy [name] TKZ:[id number] DOB: [birth year] DOA: 03/01/2022 ? ?PCP: Pcp, No  ?Patient coming from: Home  ?Level of care: Telemetry ? ?I have personally briefly reviewed patient's old medical records in [place] ? ?Chief Complaint: SOB and abdominal distension  ? ?HPI: Cathy [name] is a 41 year old female with ESRD T, Th, Sat, missed HD for past week since 02/22/22, recently discharged from [facility] after a long hospitalization for MRSA bacteremia was supposed to be getting vancomycin with HD thru 03/04/22.  She missed outpatient appt with ID Dr. West Bali. She has long history of noncompliance. She is not taking her medications as prescribed.  Her blood pressure was noted to be markedly elevated at her PCP visit and she refused to go to ER. She presented to ED at [facility] today complaining of shortness of breath after missing HD for 1 week.  She also reports abdominal pain and distension that has gotten worse over the last week. Nephrology was planning to arrange outpatient follow up with vascular surgery for eventual AVF placement.  She now has a TDC placed by IR on 3/4 as a replacement for the one that was removed on 3/1 when she was found to have bacteremia.  Her TEE was negative for vegetations on 01/30/22. She tested Covid positive on 01/29/22.  She had an ascites [place] survey on 3/10 and only trace ascites was found and no paracentesis was done.  Today her CT scan is showing a large amount of ascites and she likely will need paracentesis.  Fortunately her potassium was 4.9 today.  Nephrology was consulted and admission was requested for hemodialysis and also paracentesis when available. Pt denies fever and chills, denies chest pain but continues to complain of abdominal pain and distension and SOB.  CXR shows mild edema.  ? ?Review of Systems: Review of Systems  ?Constitutional:  Negative for chills, fever, malaise/fatigue and weight loss.  ?HENT: Negative.     ?Eyes: Negative.   ?Respiratory:  Positive for shortness of breath. Negative for cough, hemoptysis, sputum production and wheezing.   ?Cardiovascular: Negative.   ?Gastrointestinal: Negative.   ?Genitourinary: Negative.   ?Musculoskeletal:  Positive for joint pain and myalgias. Negative for falls.  ?Skin: Negative.   ?Neurological: Negative.   ?Endo/Heme/Allergies: Negative.   ?Psychiatric/Behavioral: Negative.    ?  ?Past Medical History:  ?Diagnosis Date  ? Anasarca associated with disorder of kidney 06/08/2021  ? Anxiety   ? Asthma   ? Bipolar depression ([place])   ? Depression   ? Dyspnea   ? ESRD (end stage renal disease) on dialysis ([place]) 10/2020  ? GERD (gastroesophageal reflux disease)   ? Gonorrhea   ? Lupus ([place])   ? Pelvic inflammatory disease (PID)   ? Pleural effusion 06/08/2021  ? Renal hypertension   ? Trichomonas infection   ? ? ?Past Surgical History:  ?Procedure Laterality Date  ? IR FLUORO GUIDE CV LINE RIGHT  11/30/2020  ? IR FLUORO GUIDE CV LINE RIGHT  06/13/2021  ? IR FLUORO GUIDE CV LINE RIGHT  02/01/2022  ? IR REMOVAL TUN CV CATH W/O FL  06/11/2021  ? IR REMOVAL TUN CV CATH W/O FL  01/29/2022  ? IR US GUIDE VASC ACCESS RIGHT  11/30/2020  ? IR US GUIDE VASC ACCESS RIGHT  06/13/2021  ? IR US GUIDE VASC ACCESS RIGHT  02/03/2022  ? RENAL BIOPSY    ? TEE WITHOUT CARDIOVERSION N/A 06/13/2021  ?  Procedure: TRANSESOPHAGEAL ECHOCARDIOGRAM (TEE);  Surgeon: Josue Hector, MD;  Location: [facility] ENDOSCOPY;  Service: Cardiovascular;  Laterality: N/A;  ? TEE WITHOUT CARDIOVERSION N/A 02/03/2022  ? Procedure: TRANSESOPHAGEAL ECHOCARDIOGRAM (TEE);  Surgeon: Berniece Salines, DO;  Location: [place];  Service: Cardiovascular;  Laterality: N/A;  ? TUBAL LIGATION N/A 02/03/2019  ? Procedure: POST PARTUM TUBAL LIGATION;  Surgeon: Aletha Halim, MD;  Location: [facility] LD ORS;  Service: Gynecology;  Laterality: N/A;  ? ? ? reports that she has been smoking cigarettes. She has been smoking an average of .25 packs per day. She has  never used smokeless tobacco. She reports that she does not currently use alcohol. She reports that she does not currently use drugs after having used the following drugs: Marijuana. ? ?Allergies  ?Allergen Reactions  ? Oxycodone Itching  ? ? ?Family History  ?Problem Relation Age of Onset  ? Diabetes Maternal Grandmother   ? Hypertension Maternal Grandmother   ? Diabetes Maternal Grandfather   ? Hypertension Maternal Grandfather   ? Diabetes Paternal Grandmother   ? Hypertension Paternal Grandmother   ? Diabetes Paternal Grandfather   ? Hypertension Paternal Grandfather   ? ? ?Prior to Admission medications   ?Medication Sig Start Date End Date Taking? Authorizing Provider  ?acetaminophen (TYLENOL) 500 MG tablet Take 1,000 mg by mouth every 6 (six) hours as needed for moderate pain.   Yes [provider]  ?albuterol (VENTOLIN HFA) 108 (90 Base) MCG/ACT inhaler Inhale 2 puffs into the lungs every 6 (six) hours as needed for wheezing or shortness of breath. 12/17/21 12/17/22 Yes Rai, Ripudeep K, MD  ?apixaban (ELIQUIS) 5 MG TABS tablet Take 1 tablet (5 mg total) by mouth 2 (two) times daily. 01/15/22  Yes Shelly Coss, MD  ?calcium acetate (PHOSLO) 667 MG capsule Take 2 capsules (1,334 mg total) by mouth 3 (three) times daily with meals. 01/09/22  Yes Shelly Coss, MD  ?cloNIDine (CATAPRES) 0.3 MG tablet Take 1 tablet (0.3 mg total) by mouth 3 (three) times daily. 01/09/22  Yes Shelly Coss, MD  ?guaiFENesin (MUCINEX) 600 MG 12 hr tablet Take 600 mg by mouth 2 (two) times daily as needed for cough.   Yes [provider]  ?hydroxychloroquine (PLAQUENIL) 200 MG tablet Take 1/2 tablet (100 mg total) by mouth every other day after hemodialysis 01/09/22  Yes Shelly Coss, MD  ?ipratropium (ATROVENT HFA) 17 MCG/ACT inhaler Inhale 2 puffs into the lungs every 6 (six) hours as needed for wheezing. 12/17/21 12/17/22 Yes Rai, Ripudeep K, MD  ?sevelamer carbonate (RENVELA) 800 MG tablet Take 800 mg by mouth 3  (three) times daily. 02/27/22  Yes [provider]  ?amLODipine (NORVASC) 10 MG tablet Take 1 tablet (10 mg total) by mouth daily. 01/09/22   Shelly Coss, MD  ?Blood Pressure Monitoring (OMRON 3 SERIES BP MONITOR) DEVI Use as directed 2 times a day 01/09/22   Shelly Coss, MD  ?carvedilol (COREG) 25 MG tablet Take 1 tablet (25 mg total) by mouth 2 (two) times daily with a meal. 02/08/22   Ghimire, Henreitta Leber, MD  ?colchicine 0.6 MG tablet Take 0.5 tablets (0.3 mg total) by mouth daily. ?Patient not taking: Reported on 03/01/2022 12/31/21   Arrien, Jimmy Picket, MD  ?hydrALAZINE (APRESOLINE) 100 MG tablet Take 1 tablet (100 mg total) by mouth 3 (three) times daily. ?Patient not taking: Reported on 03/01/2022 01/09/22   Shelly Coss, MD  ?hydrOXYzine (ATARAX) 25 MG tablet Take 1 tablet (25 mg total) by mouth 3 (  three) times daily as needed for itching or anxiety. ?Patient not taking: Reported on 03/01/2022 12/17/21   Rai, Vernelle Emerald, MD  ?losartan (COZAAR) 50 MG tablet Take 1 tablet (50 mg total) by mouth at bedtime. 02/08/22   Ghimire, Henreitta Leber, MD  ?nicotine (NICODERM CQ - DOSED IN MG/24 HOURS) 21 mg/24hr patch Place 1 patch (21 mg total) onto the skin daily. ?Patient not taking: Reported on 01/31/2022 01/10/22   Shelly Coss, MD  ?oxyCODONE (OXY IR/ROXICODONE) 5 MG immediate release tablet Take 1 tablet (5 mg total) by mouth every 6 (six) hours as needed for severe pain. ?Patient not taking: Reported on 01/31/2022 12/30/21   Arrien, Jimmy Picket, MD  ?predniSONE (DELTASONE) 20 MG tablet Take 2 tablets (40 mg total) by mouth daily with breakfast. ?Patient not taking: Reported on 02/12/2022 12/31/21 04/05/22  Arrien, Jimmy Picket, MD  ?vancomycin (VANCOREADY) 750 MG/150ML SOLN Inject 150 mLs (750 mg total) into the vein Every Tuesday,Thursday,and Saturday with dialysis for 24 days. ?Patient not taking: Reported on 03/01/2022 02/08/22 03/04/22  Jonetta Osgood, MD  ?fenofibrate 160 MG tablet Take 1 tablet (160 mg  total) by mouth daily. 12/31/20 01/20/21  Elmarie Shiley, MD  ? ? ?Physical Exam: ?Vitals:  ? 03/01/22 1330 03/01/22 1400 03/01/22 1430 03/01/22 1500  ?BP: (!) 128/98 (!) 133/103 (!) 145/108 (!) 131/105

## 2022-03-01 NOTE — Assessment & Plan Note (Addendum)
--  longstanding problem that could eventually lead to patient's death if she does not change this behavior, I counseled with her today and she verbalized understanding ?

## 2022-03-01 NOTE — Assessment & Plan Note (Signed)
--  resume home prednisone therapy ?

## 2022-03-01 NOTE — Procedures (Signed)
? ?  HEMODIALYSIS TREATMENT NOTE: ? ? ?Uneventful 4 hour treatment completed using right IJ TDC.  Cath exit site is unremarkable.  Goal met: 4.5 liters removed without interruption in ultrafiltration.  Vancomycin given during last hour.  All blood was returned.  No changes from pre-HD assessment. ? ?Rockwell Alexandria, RN ?

## 2022-03-01 NOTE — Assessment & Plan Note (Addendum)
--  chronic issue related to poor compliance with dialysis ?--asymptomatic at this time ?--pt remains hemodynamically stable ?

## 2022-03-01 NOTE — ED Notes (Signed)
Patient gives permission to be notify father, Lenn Cal, of any updates regarding her health and stay with this visit.  ?

## 2022-03-01 NOTE — Assessment & Plan Note (Addendum)
--  due to not taking blood pressure medication ?--fortunately the BP improved when given regular home BP meds  ?--resolved now ?

## 2022-03-01 NOTE — Hospital Course (Addendum)
41 year old female with ESRD T, Th, Sat, missed HD for past week since 02/22/22, recently discharged from [facility] after a long hospitalization for MRSA bacteremia was supposed to be getting vancomycin with HD thru 03/04/22.  She missed outpatient appt with ID Dr. West Bali. She has long history of noncompliance. She is not taking her medications as prescribed.  Her blood pressure was noted to be markedly elevated at her PCP visit and she refused to go to ER. She presented to ED at [facility] today complaining of shortness of breath after missing HD for 1 week.  She also reports abdominal pain and distension that has gotten worse over the last week. Nephrology was planning to arrange outpatient follow up with vascular surgery for eventual AVF placement.  She now has a TDC placed by IR on 3/4 as a replacement for the one that was removed on 3/1 when she was found to have bacteremia.  Her TEE was negative for vegetations on 01/30/22. She tested Covid positive on 01/29/22.  She had an ascites [place] survey on 3/10 and only trace ascites was found and no paracentesis was done.  Today her CT scan is showing a large amount of ascites and she likely will need paracentesis.  Fortunately her potassium was 4.9 today.  Nephrology was consulted and admission was requested for hemodialysis and also paracentesis when available. Pt denies fever and chills, denies chest pain but continues to complain of abdominal pain and distension and SOB.  CXR shows mild edema.  ? ?03/02/2022:  pt had HD on 4/1 and 4.5L was removed. Abdomen remains very distended and painful.  Unable to get paracentesis at this hospital until 4/3.  ? ?03/03/2022:  pt feels less SOB now but not back to baseline. Abdominal distension remains and painful. She is agreeable to getting a paracentesis done.    ? ?03/04/2022:  Pt reports that she is having gas pain and flatulence. She is still having abdominal discomfort but markedly improved since paracentesis.   ? ?03/05/2022:   patient accepted at HD unit in [place].  Her transportation home will stop at [facility] pharmacy to pick up her anti-HTN meds on the way home.   ?

## 2022-03-01 NOTE — Assessment & Plan Note (Signed)
>>  ASSESSMENT AND PLAN FOR TOBACCO DEPENDENCE WRITTEN ON 03/01/2022  2:48 PM BY Cleora Fleet, MD  --will offer nicotine patch while in hospital --strongly advised patient to stop all tobacco use

## 2022-03-01 NOTE — Assessment & Plan Note (Addendum)
--  pt was treated at [facility] and after discharge was supposed to do vancomycin with HD thru 03/04/22.  Unfortunately she no-showed for her ID appt and she missed last week of HD.  Currently has normal WBC and no fever.  Resume vancomycin with HD but might need to extend treatment for 1 week.   ?--Pt received vancomycin with HD on 03/01/22.  She was due to complete this on 4/4 however has missed 1 full week of HD treatment. Should extend therapy for 1 additional week.  ?--vancomycin 750 mg on HD outpt 4/6, 4/8, 4/11 ?

## 2022-03-01 NOTE — Progress Notes (Signed)
I was notified of this patient being admitted after presenting with SOB and abdominal distention in the setting of missing a week of HD- she dialyzes at [place] on TTS- last there 3/25 ? ?Her orders are 4 hours/180/2k, 2ca/EDW 52/ has TDC ?Heparin only in [facility], hectorol 4 and mircera-  also was supposed to be on vanc thru 4/4 for MRSA bacteremia in March-  had old TDC removed and line holiday during that hosp ? ?On treatment 3/25 had BP of over 200 pre and post-  left out at 73.7 kg  ? ?I have talked to the HD RN-  she will come in and do treatment today-  full consult to be done either tomorrow or Monday-  call with concerns  ? ?Louis Meckel ? ? ? ? ?

## 2022-03-01 NOTE — Assessment & Plan Note (Signed)
--  will offer nicotine patch while in hospital ?--strongly advised patient to stop all tobacco use ?

## 2022-03-01 NOTE — Assessment & Plan Note (Addendum)
--  pt has an outpatient referral to rheumatology in process ?--remains on plaquenil and prednisone ?

## 2022-03-01 NOTE — Assessment & Plan Note (Addendum)
--  due to noncompliance with diet, medications and hemodialysis treatments.  ?--consulted to nephrology for hemodialysis;  Pt had HD on 4/1 with 4.5 L removed.  ?--paracentesis when available (not available at [facility] on weekends) ?--last HD on 03/04/22--4L removed ?--next HD at outpatient dialysis center [facility]) 03/06/22 ?Stable on RA ?

## 2022-03-01 NOTE — Assessment & Plan Note (Signed)
--  careful with controlled substances ?

## 2022-03-01 NOTE — Assessment & Plan Note (Addendum)
--  no wheezing or hypoxia at this time ?--stable on RA ?

## 2022-03-01 NOTE — ED Provider Notes (Signed)
?[place] ?Provider Note ? ? ?CSN: [id number] ?Arrival date & time: 03/01/22  3428 ? ?  ? ?History ? ?Chief Complaint  ?Patient presents with  ? Shortness of Breath  ?  Missed dialysis x2  ? ? ?Cathy [name] is a 42 y.o. female. ? ?HPI ? ?Patient with medical history notable for ESRD TTS HD, SLE, CHF, malignant hypertension, history of recent admission due to sepsis from MRSA bacteremia (3/1-3/11/23) and noncompliance presents today due to shortness of breath, abdominal pain and missed dialysis x1 week.   ? ?Patient's last dialysis was Saturday 7 days ago.  She has been feeling increasingly short of breath over the last week.  Denies any cough, she is having intermittent chest pain which is sharp.  She also endorses abdominal distention and pain diffusely across her abdomen which is constant.  She is nauseated but had not vomited.  She is having regular bowel movements, solid stool earlier today.   ? ?At home she is on 2 L of supplemental oxygen as needed use primarily at night. ? ?She is status tubal ligation. ? ?Does not have AV fistula access. ? ?Home Medications ?Prior to Admission medications   ?Medication Sig Start Date End Date Taking? Authorizing Provider  ?acetaminophen (TYLENOL) 500 MG tablet Take 1,000 mg by mouth every 6 (six) hours as needed for moderate pain.   Yes [provider]  ?albuterol (VENTOLIN HFA) 108 (90 Base) MCG/ACT inhaler Inhale 2 puffs into the lungs every 6 (six) hours as needed for wheezing or shortness of breath. 12/17/21 12/17/22 Yes Rai, Ripudeep K, MD  ?apixaban (ELIQUIS) 5 MG TABS tablet Take 1 tablet (5 mg total) by mouth 2 (two) times daily. 01/15/22  Yes Shelly Coss, MD  ?calcium acetate (PHOSLO) 667 MG capsule Take 2 capsules (1,334 mg total) by mouth 3 (three) times daily with meals. 01/09/22  Yes Shelly Coss, MD  ?cloNIDine (CATAPRES) 0.3 MG tablet Take 1 tablet (0.3 mg total) by mouth 3 (three) times daily. 01/09/22  Yes Shelly Coss,  MD  ?guaiFENesin (MUCINEX) 600 MG 12 hr tablet Take 600 mg by mouth 2 (two) times daily as needed for cough.   Yes [provider]  ?hydroxychloroquine (PLAQUENIL) 200 MG tablet Take 1/2 tablet (100 mg total) by mouth every other day after hemodialysis 01/09/22  Yes Shelly Coss, MD  ?ipratropium (ATROVENT HFA) 17 MCG/ACT inhaler Inhale 2 puffs into the lungs every 6 (six) hours as needed for wheezing. 12/17/21 12/17/22 Yes Rai, Ripudeep K, MD  ?sevelamer carbonate (RENVELA) 800 MG tablet Take 800 mg by mouth 3 (three) times daily. 02/27/22  Yes [provider]  ?amLODipine (NORVASC) 10 MG tablet Take 1 tablet (10 mg total) by mouth daily. 01/09/22   Shelly Coss, MD  ?Blood Pressure Monitoring (OMRON 3 SERIES BP MONITOR) DEVI Use as directed 2 times a day 01/09/22   Shelly Coss, MD  ?carvedilol (COREG) 25 MG tablet Take 1 tablet (25 mg total) by mouth 2 (two) times daily with a meal. 02/08/22   Ghimire, Henreitta Leber, MD  ?colchicine 0.6 MG tablet Take 0.5 tablets (0.3 mg total) by mouth daily. ?Patient not taking: Reported on 03/01/2022 12/31/21   Arrien, Jimmy Picket, MD  ?hydrALAZINE (APRESOLINE) 100 MG tablet Take 1 tablet (100 mg total) by mouth 3 (three) times daily. ?Patient not taking: Reported on 03/01/2022 01/09/22   Shelly Coss, MD  ?hydrOXYzine (ATARAX) 25 MG tablet Take 1 tablet (25 mg total) by mouth 3 (three) times  daily as needed for itching or anxiety. ?Patient not taking: Reported on 03/01/2022 12/17/21   Rai, Vernelle Emerald, MD  ?losartan (COZAAR) 50 MG tablet Take 1 tablet (50 mg total) by mouth at bedtime. 02/08/22   Ghimire, Henreitta Leber, MD  ?nicotine (NICODERM CQ - DOSED IN MG/24 HOURS) 21 mg/24hr patch Place 1 patch (21 mg total) onto the skin daily. ?Patient not taking: Reported on 01/31/2022 01/10/22   Shelly Coss, MD  ?oxyCODONE (OXY IR/ROXICODONE) 5 MG immediate release tablet Take 1 tablet (5 mg total) by mouth every 6 (six) hours as needed for severe pain. ?Patient not taking:  Reported on 01/31/2022 12/30/21   Arrien, Jimmy Picket, MD  ?predniSONE (DELTASONE) 20 MG tablet Take 2 tablets (40 mg total) by mouth daily with breakfast. ?Patient not taking: Reported on 02/12/2022 12/31/21 04/05/22  Arrien, Jimmy Picket, MD  ?vancomycin (VANCOREADY) 750 MG/150ML SOLN Inject 150 mLs (750 mg total) into the vein Every Tuesday,Thursday,and Saturday with dialysis for 24 days. ?Patient not taking: Reported on 03/01/2022 02/08/22 03/04/22  Jonetta Osgood, MD  ?fenofibrate 160 MG tablet Take 1 tablet (160 mg total) by mouth daily. 12/31/20 01/20/21  Elmarie Shiley, MD  ?   ? ?Allergies    ?Oxycodone   ? ?Review of Systems   ?Review of Systems ? ?Physical Exam ?Updated Vital Signs ?BP (!) 128/98   Pulse 62   Temp 98.4 ?F (36.9 ?C) (Oral)   Resp 11   Ht 5\' 7"  (1.702 m)   Wt 75.1 kg   SpO2 100%   BMI 25.93 kg/m?  ?Physical Exam ?Vitals and nursing note reviewed. Exam conducted with a chaperone present.  ?Constitutional:   ?   Appearance: Normal appearance. She is ill-appearing.  ?HENT:  ?   Head: Normocephalic and atraumatic.  ?Eyes:  ?   General: No scleral icterus.    ?   Right eye: No discharge.     ?   Left eye: No discharge.  ?   Extraocular Movements: Extraocular movements intact.  ?   Pupils: Pupils are equal, round, and reactive to light.  ?Cardiovascular:  ?   Rate and Rhythm: Normal rate and regular rhythm.  ?   Heart sounds: Normal heart sounds.  ?Pulmonary:  ?   Effort: Pulmonary effort is normal. No respiratory distress.  ?   Breath sounds: Decreased breath sounds present.  ?Abdominal:  ?   General: Abdomen is flat. Bowel sounds are normal. There is distension.  ?   Palpations: Abdomen is soft.  ?   Tenderness: There is generalized abdominal tenderness. There is guarding.  ?   Comments: Diffuse abdominal tenderness with guarding.  Abdomen distended.  ?Musculoskeletal:  ?   Comments: Bilateral trace lower extremity edema  ?Skin: ?   General: Skin is warm and dry.  ?   Coloration: Skin  is not jaundiced.  ?Neurological:  ?   Mental Status: She is alert. Mental status is at baseline.  ?   Coordination: Coordination normal.  ? ? ?ED Results / Procedures / Treatments   ?Labs ?(all labs ordered are listed, but only abnormal results are displayed) ?Labs Reviewed  ?COMPREHENSIVE METABOLIC PANEL - Abnormal; Notable for the following components:  ?    Result Value  ? Chloride 96 (*)   ? BUN 67 (*)   ? Creatinine, Ser 14.32 (*)   ? Calcium 8.8 (*)   ? Albumin 3.3 (*)   ? Alkaline Phosphatase 288 (*)   ? GFR, Estimated  3 (*)   ? Anion gap 18 (*)   ? All other components within normal limits  ?CBC WITH DIFFERENTIAL/PLATELET - Abnormal; Notable for the following components:  ? RDW 20.6 (*)   ? All other components within normal limits  ?BRAIN NATRIURETIC PEPTIDE - Abnormal; Notable for the following components:  ? B Natriuretic Peptide >4,500.0 (*)   ? All other components within normal limits  ?PROTIME-INR - Abnormal; Notable for the following components:  ? Prothrombin Time 21.1 (*)   ? INR 1.8 (*)   ? All other components within normal limits  ?APTT - Abnormal; Notable for the following components:  ? aPTT 39 (*)   ? All other components within normal limits  ?TROPONIN I (HIGH SENSITIVITY) - Abnormal; Notable for the following components:  ? Troponin I (High Sensitivity) 74 (*)   ? All other components within normal limits  ?TROPONIN I (HIGH SENSITIVITY) - Abnormal; Notable for the following components:  ? Troponin I (High Sensitivity) 80 (*)   ? All other components within normal limits  ?CULTURE, BLOOD (ROUTINE X 2)  ?CULTURE, BLOOD (ROUTINE X 2)  ?RESP PANEL BY RT-PCR (FLU A&B, COVID) [id number]  ?LIPASE, BLOOD  ?LACTIC ACID, PLASMA  ? ? ?EKG ?EKG Interpretation ? ?Date/Time:  Saturday March 01 2022 09:54:13 EDT ?Ventricular Rate:  86 ?PR Interval:  152 ?QRS Duration: 94 ?QT Interval:  426 ?QTC Calculation: 510 ?R Axis:   42 ?Text Interpretation: Sinus rhythm Left atrial enlargement Prolonged QTc Confirmed  by Octaviano Glow [phone]) on 03/01/2022 9:57:16 AM ? ?Radiology ?CT ABDOMEN PELVIS WO CONTRAST ? ?Result Date: 03/01/2022 ?CLINICAL DATA:  41 year old female with acute abdominal pain and distension. Patient with end

## 2022-03-02 DIAGNOSIS — Z20822 Contact with and (suspected) exposure to covid-19: Secondary | ICD-10-CM | POA: Diagnosis present

## 2022-03-02 DIAGNOSIS — N186 End stage renal disease: Secondary | ICD-10-CM | POA: Diagnosis not present

## 2022-03-02 DIAGNOSIS — I700 Atherosclerosis of aorta: Secondary | ICD-10-CM | POA: Diagnosis not present

## 2022-03-02 DIAGNOSIS — I2699 Other pulmonary embolism without acute cor pulmonale: Secondary | ICD-10-CM

## 2022-03-02 DIAGNOSIS — J45909 Unspecified asthma, uncomplicated: Secondary | ICD-10-CM | POA: Diagnosis present

## 2022-03-02 DIAGNOSIS — Z91128 Patient's intentional underdosing of medication regimen for other reason: Secondary | ICD-10-CM | POA: Diagnosis not present

## 2022-03-02 DIAGNOSIS — Z91158 Patient's noncompliance with renal dialysis for other reason: Secondary | ICD-10-CM | POA: Diagnosis not present

## 2022-03-02 DIAGNOSIS — Z8616 Personal history of COVID-19: Secondary | ICD-10-CM | POA: Diagnosis not present

## 2022-03-02 DIAGNOSIS — D631 Anemia in chronic kidney disease: Secondary | ICD-10-CM | POA: Diagnosis not present

## 2022-03-02 DIAGNOSIS — R188 Other ascites: Secondary | ICD-10-CM | POA: Diagnosis not present

## 2022-03-02 DIAGNOSIS — E8770 Fluid overload, unspecified: Secondary | ICD-10-CM | POA: Diagnosis present

## 2022-03-02 DIAGNOSIS — R7881 Bacteremia: Secondary | ICD-10-CM | POA: Diagnosis not present

## 2022-03-02 DIAGNOSIS — I120 Hypertensive chronic kidney disease with stage 5 chronic kidney disease or end stage renal disease: Secondary | ICD-10-CM | POA: Diagnosis not present

## 2022-03-02 DIAGNOSIS — M3214 Glomerular disease in systemic lupus erythematosus: Secondary | ICD-10-CM | POA: Diagnosis not present

## 2022-03-02 DIAGNOSIS — N250 Renal osteodystrophy: Secondary | ICD-10-CM | POA: Diagnosis not present

## 2022-03-02 DIAGNOSIS — Z7952 Long term (current) use of systemic steroids: Secondary | ICD-10-CM

## 2022-03-02 DIAGNOSIS — I5022 Chronic systolic (congestive) heart failure: Secondary | ICD-10-CM | POA: Diagnosis present

## 2022-03-02 DIAGNOSIS — F17210 Nicotine dependence, cigarettes, uncomplicated: Secondary | ICD-10-CM | POA: Diagnosis present

## 2022-03-02 DIAGNOSIS — T368X6A Underdosing of other systemic antibiotics, initial encounter: Secondary | ICD-10-CM | POA: Diagnosis present

## 2022-03-02 DIAGNOSIS — M3212 Pericarditis in systemic lupus erythematosus: Secondary | ICD-10-CM | POA: Diagnosis present

## 2022-03-02 DIAGNOSIS — K219 Gastro-esophageal reflux disease without esophagitis: Secondary | ICD-10-CM | POA: Diagnosis present

## 2022-03-02 DIAGNOSIS — E8779 Other fluid overload: Secondary | ICD-10-CM | POA: Diagnosis not present

## 2022-03-02 DIAGNOSIS — B9562 Methicillin resistant Staphylococcus aureus infection as the cause of diseases classified elsewhere: Secondary | ICD-10-CM | POA: Diagnosis not present

## 2022-03-02 DIAGNOSIS — I10 Essential (primary) hypertension: Secondary | ICD-10-CM | POA: Diagnosis not present

## 2022-03-02 DIAGNOSIS — Y92009 Unspecified place in unspecified non-institutional (private) residence as the place of occurrence of the external cause: Secondary | ICD-10-CM | POA: Diagnosis not present

## 2022-03-02 DIAGNOSIS — Z91119 Patient's noncompliance with dietary regimen due to unspecified reason: Secondary | ICD-10-CM | POA: Diagnosis not present

## 2022-03-02 DIAGNOSIS — I160 Hypertensive urgency: Secondary | ICD-10-CM | POA: Diagnosis not present

## 2022-03-02 DIAGNOSIS — I132 Hypertensive heart and chronic kidney disease with heart failure and with stage 5 chronic kidney disease, or end stage renal disease: Secondary | ICD-10-CM | POA: Diagnosis present

## 2022-03-02 DIAGNOSIS — Z7689 Persons encountering health services in other specified circumstances: Secondary | ICD-10-CM | POA: Diagnosis not present

## 2022-03-02 DIAGNOSIS — Z992 Dependence on renal dialysis: Secondary | ICD-10-CM | POA: Diagnosis not present

## 2022-03-02 DIAGNOSIS — Z86711 Personal history of pulmonary embolism: Secondary | ICD-10-CM | POA: Diagnosis not present

## 2022-03-02 DIAGNOSIS — I3139 Other pericardial effusion (noninflammatory): Secondary | ICD-10-CM | POA: Diagnosis present

## 2022-03-02 LAB — RENAL FUNCTION PANEL
Albumin: 2.7 g/dL — ABNORMAL LOW (ref 3.5–5.0)
Anion gap: 13 (ref 5–15)
BUN: 30 mg/dL — ABNORMAL HIGH (ref 6–20)
CO2: 25 mmol/L (ref 22–32)
Calcium: 7.9 mg/dL — ABNORMAL LOW (ref 8.9–10.3)
Chloride: 97 mmol/L — ABNORMAL LOW (ref 98–111)
Creatinine, Ser: 8.53 mg/dL — ABNORMAL HIGH (ref 0.44–1.00)
GFR, Estimated: 6 mL/min — ABNORMAL LOW (ref >60)
Glucose, Bld: 78 mg/dL (ref 70–99)
Phosphorus: 3.4 mg/dL (ref 2.5–4.6)
Potassium: 3.6 mmol/L (ref 3.5–5.1)
Sodium: 135 mmol/L (ref 135–145)

## 2022-03-02 LAB — MAGNESIUM: Magnesium: 2 mg/dL (ref 1.7–2.4)

## 2022-03-02 MED ORDER — HYDROCODONE-ACETAMINOPHEN 5-325 MG PO TABS
1.0000 | ORAL_TABLET | Freq: Four times a day (QID) | ORAL | Status: DC | PRN
  Administered 2022-03-02 (×2): 2 via ORAL
  Filled 2022-03-02 (×3): qty 2

## 2022-03-02 NOTE — Progress Notes (Signed)
?PROGRESS NOTE ? ? ?Cathy [name]  WUX:[id number] DOB: [birth year] DOA: 03/01/2022 ?PCP: Pcp, No  ? ?Chief Complaint  ?Patient presents with  ? Shortness of Breath  ?  Missed dialysis x2  ? ?Level of care: Telemetry ? ?Brief Admission History:  ?41 year old female with ESRD T, Th, Sat, missed HD for past week since 02/22/22, recently discharged from [facility] after a long hospitalization for MRSA bacteremia was supposed to be getting vancomycin with HD thru 03/04/22.  She missed outpatient appt with ID Dr. [place] Bali. She has long history of noncompliance. She is not taking her medications as prescribed.  Her blood pressure was noted to be markedly elevated at her PCP visit and she refused to go to ER. She presented to ED at [facility] today complaining of shortness of breath after missing HD for 1 week.  She also reports abdominal pain and distension that has gotten worse over the last week. Nephrology was planning to arrange outpatient follow up with vascular surgery for eventual AVF placement.  She now has a TDC placed by IR on 3/4 as a replacement for the one that was removed on 3/1 when she was found to have bacteremia.  Her TEE was negative for vegetations on 01/30/22. She tested Covid positive on 01/29/22.  She had an ascites [place] survey on 3/10 and only trace ascites was found and no paracentesis was done.  Today her CT scan is showing a large amount of ascites and she likely will need paracentesis.  Fortunately her potassium was 4.9 today.  Nephrology was consulted and admission was requested for hemodialysis and also paracentesis when available. Pt denies fever and chills, denies chest pain but continues to complain of abdominal pain and distension and SOB.  CXR shows mild edema.  ? ?03/02/2022:  pt had HD on 4/1 and 4.5L was removed. Abdomen remains very distended and painful.  Unable to get paracentesis at this hospital until 4/3.   ?  ?Assessment and Plan: ?Volume overload ?--due to noncompliance with diet,  medications and hemodialysis treatments.  ?--consulted to nephrology for hemodialysis;  Pt had HD on 4/1 with 4.5 L removed.  ?--paracentesis when available (not available at [facility] on weekends) ? ?Hypertensive urgency ?--due to not taking blood pressure medication ?--fortunately the BP improved when ED gave patient her BP meds today.  ?--plan to resume home BP meds ? ?Malignant hypertension ?--when blood pressure meds are taken BP seems to be much better controlled ?--plan to resume home meds ? ?ESRD on hemodialysis ([place]) ?--Pt has missed 1 week of dialysis, last reported treatment was 3/25 ?--consult to nephrology for HD treatment while in hospital ?--Pt had HD on 4/1 ? ?Ascites ?--large amount of ascites per CT scan ?--planning for paracentesis on 4/3 ?--HOLD APIXABAN FURTHER APIXABAN FOR PARACENTESIS  ? ?Tobacco dependence ?--will offer nicotine patch while in hospital ?--strongly advised patient to stop all tobacco use ? ?MRSA bacteremia ?--pt was treated at [facility] and after discharge was supposed to do vancomycin with HD thru 03/04/22.  Unfortunately she no-showed for her ID appt and she missed last week of HD.  Currently has normal WBC and no fever.  Resume vancomycin with HD but might need to extend treatment for 1 week.   ?--Pt received vancomycin with HD on 03/01/22.  She was due to complete this on 4/4 however has missed 1 full week of HD treatment. Should extend therapy for 1 additional week.  ? ?History of pulmonary embolus ?--temporarily hold further apixaban today  for possible paracentesis on 4/3 ? ?HFrEF (heart failure with reduced ejection fraction) ([place]) ?--pt has referral for cardiology follow up ?--It appears that she did not show for the 02/13/22 appt ? 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 ?Pericardial effusion-chronic  ?--chronic issue related to poor compliance with dialysis ?--asymptomatic at this time ? ?Current chronic use of systemic steroids ?--resume  home prednisone therapy ? ?Non compliance w medication regimen ?--longstanding problem that will eventually lead to patient's death, I counseled with her today and she verbalized understanding ? ?Substance abuse ([place]) ?--careful with controlled substances ? ?Systemic lupus erythematosus (SLE) with pericarditis ([place]) ?--pt has an outpatient referral to rheumatology in process ?--remains on plaquenil and prednisone ? ?Asthma ?--no wheezing or hypoxia at this time ?--following ? ? ?DVT prophylaxis: apixaban (on hold for paracentesis 4/3) SCDs ?Code Status: full  ?Family Communication:  ?Disposition: Status is: Observation ?The patient remains OBS appropriate and will d/c before 2 midnights. ?  ?Consultants:  ?nephrology ?Procedures:  ?Hemodialysis 03/01/22 ?Antimicrobials:  ?Vancomycin with hemodialysis   ?Subjective: ?Pt reports abdominal pain and distension.  ?Objective: ?Vitals:  ? 03/02/22 0000 03/02/22 0020 03/02/22 0036 03/02/22 1025  ?BP: (!) 138/92 (!) 148/96 (!) 156/110 (!) 147/112  ?Pulse: 67 70 72 64  ?Resp:  18 20 19   ?Temp:   98.7 ?F (37.1 ?C) 98.6 ?F (37 ?C)  ?TempSrc:   Oral Oral  ?SpO2:   100% 100%  ?Weight:      ?Height:      ? ? ?Intake/Output Summary (Last 24 hours) at 03/02/2022 1428 ?Last data filed at 03/02/2022 0900 ?Gross per 24 hour  ?Intake 390 ml  ?Output 4500 ml  ?Net -4110 ml  ? ?Filed Weights  ? 03/01/22 0946 03/01/22 1749 03/01/22 2000  ?Weight: 75.1 kg 74.9 kg 74.9 kg  ? ?Examination: ? ?General exam: chronically ill appearing female, Appears calm and comfortable  ?Respiratory system: crackles RLL. Respiratory effort normal. ?Cardiovascular system: normal S1 & S2 heard. No JVD, murmurs, rubs, gallops or clicks. No pedal edema. ?Gastrointestinal system: Abdomen is markedly distended with fluid waves, tense and tender to palpation. No organomegaly or masses felt. Normal bowel sounds heard. ?Central nervous system: Alert and oriented. No focal neurological deficits. ?Extremities: Symmetric 5 x 5  power. ?Skin: No rashes, lesions or ulcers. ?Psychiatry: Judgement and insight appear poor. Mood & affect appropriate.  ? ?Data Reviewed: I have personally reviewed following labs and imaging studies ? ?CBC: ?Recent Labs  ?Lab 03/01/22 ?1040  ?WBC 7.9  ?NEUTROABS 5.3  ?HGB 12.0  ?HCT 39.8  ?MCV 92.1  ?PLT 272  ? ? ?Basic Metabolic Panel: ?Recent Labs  ?Lab 03/01/22 ?1040 03/02/22 ?8527  ?NA 136 135  ?K 4.9 3.6  ?CL 96* 97*  ?CO2 22 25  ?GLUCOSE 90 78  ?BUN 67* 30*  ?CREATININE 14.32* 8.53*  ?CALCIUM 8.8* 7.9*  ?MG  --  2.0  ?PHOS  --  3.4  ? ? ?CBG: ?No results for input(s): GLUCAP in the last 168 hours. ? ?Recent Results (from the past 240 hour(s))  ?Resp Panel by RT-PCR (Flu A&B, Covid) Nasopharyngeal Swab     Status: None  ? Collection Time: 03/01/22  9:53 AM  ? Specimen: Nasopharyngeal Swab; Nasopharyngeal(NP) swabs in vial transport medium  ?Result Value Ref Range Status  ? SARS Coronavirus 2 by RT PCR NEGATIVE NEGATIVE Final  ?  Comment: (NOTE) ?SARS-CoV-2 target nucleic acids are NOT DETECTED. ? ?The SARS-CoV-2 RNA is generally detectable  in upper respiratory ?specimens during the acute phase of infection. The lowest ?concentration of SARS-CoV-2 viral copies this assay can detect is ?138 copies/mL. A negative result does not preclude SARS-Cov-2 ?infection and should not be used as the sole basis for treatment or ?other patient management decisions. A negative result may occur with  ?improper specimen collection/handling, submission of specimen other ?than nasopharyngeal swab, presence of viral mutation(s) within the ?areas targeted by this assay, and inadequate number of viral ?copies(<138 copies/mL). A negative result must be combined with ?clinical observations, patient history, and epidemiological ?information. The expected result is Negative. ? ?Fact Sheet for Patients:  ?EntrepreneurPulse.com.au ? ?Fact Sheet for Healthcare Providers:  ?IncredibleEmployment.be ? ?This  test is no t yet approved or cleared by the [place] FDA and  ?has been authorized for detection and/or diagnosis of SARS-CoV-2 by ?FDA under an Emergency Use Authorization (EUA). This EUA will remain

## 2022-03-02 NOTE — Plan of Care (Signed)

## 2022-03-02 NOTE — Assessment & Plan Note (Addendum)
--  large amount of ascites per CT scan ?-- paracentesis on 4/3 ?--HELD APIXABAN FOR PARACENTESIS  ?--paracentesis completed with 3L fluid removed ?--restarted apixaban  ?--cell count and cultures neg for infection ?

## 2022-03-03 ENCOUNTER — Encounter (HOSPITAL_COMMUNITY): Payer: Self-pay | Admitting: Family Medicine

## 2022-03-03 ENCOUNTER — Inpatient Hospital Stay (HOSPITAL_COMMUNITY): Payer: Medicare Other

## 2022-03-03 DIAGNOSIS — I2699 Other pulmonary embolism without acute cor pulmonale: Secondary | ICD-10-CM | POA: Diagnosis not present

## 2022-03-03 DIAGNOSIS — I700 Atherosclerosis of aorta: Secondary | ICD-10-CM | POA: Diagnosis not present

## 2022-03-03 DIAGNOSIS — R188 Other ascites: Secondary | ICD-10-CM | POA: Diagnosis not present

## 2022-03-03 DIAGNOSIS — Z7952 Long term (current) use of systemic steroids: Secondary | ICD-10-CM | POA: Diagnosis not present

## 2022-03-03 LAB — GRAM STAIN: Gram Stain: NONE SEEN

## 2022-03-03 LAB — RENAL FUNCTION PANEL
Albumin: 2.9 g/dL — ABNORMAL LOW (ref 3.5–5.0)
Anion gap: 14 (ref 5–15)
BUN: 41 mg/dL — ABNORMAL HIGH (ref 6–20)
CO2: 26 mmol/L (ref 22–32)
Calcium: 8.2 mg/dL — ABNORMAL LOW (ref 8.9–10.3)
Chloride: 95 mmol/L — ABNORMAL LOW (ref 98–111)
Creatinine, Ser: 10.06 mg/dL — ABNORMAL HIGH (ref 0.44–1.00)
GFR, Estimated: 5 mL/min — ABNORMAL LOW (ref >60)
Glucose, Bld: 120 mg/dL — ABNORMAL HIGH (ref 70–99)
Phosphorus: 4 mg/dL (ref 2.5–4.6)
Potassium: 4.1 mmol/L (ref 3.5–5.1)
Sodium: 135 mmol/L (ref 135–145)

## 2022-03-03 LAB — BODY FLUID CELL COUNT WITH DIFFERENTIAL
Eos, Fluid: 0 %
Lymphs, Fluid: 77 %
Monocyte-Macrophage-Serous Fluid: 22 % — ABNORMAL LOW (ref 50–90)
Neutrophil Count, Fluid: 1 % (ref 0–25)
Total Nucleated Cell Count, Fluid: 121 cu mm (ref 0–1000)

## 2022-03-03 LAB — GLUCOSE, PLEURAL OR PERITONEAL FLUID: Glucose, Fluid: 125 mg/dL

## 2022-03-03 LAB — PROTEIN, PLEURAL OR PERITONEAL FLUID: Total protein, fluid: <3 g/dL

## 2022-03-03 LAB — MRSA NEXT GEN BY PCR, NASAL: MRSA by PCR Next Gen: NOT DETECTED

## 2022-03-03 MED ORDER — DOXERCALCIFEROL 4 MCG/2ML IV SOLN
4.0000 ug | Freq: Once | INTRAVENOUS | Status: AC
  Administered 2022-03-04: 4 ug via INTRAVENOUS
  Filled 2022-03-03: qty 2

## 2022-03-03 MED ORDER — CHLORHEXIDINE GLUCONATE CLOTH 2 % EX PADS
6.0000 | MEDICATED_PAD | Freq: Every day | CUTANEOUS | Status: DC
  Administered 2022-03-03 – 2022-03-05 (×3): 6 via TOPICAL

## 2022-03-03 NOTE — Consult Note (Signed)
Reason for Consult: To manage dialysis and dialysis related needs ?Referring Physician: Wynetta Emery ? ?Cathy [name] is an 41 y.o. female with past medical history significant for HTN-  severe, lupus, psychiatric disorder with medical noncompliance and substance abuse, fairly recent covid infection as well as hospitalzation earlier in the month for MRSA bacteremia  had TDC removed and line holiday-  TEE neg for veg-  was supposed to be on vanc at HD until 4/4.  She presented to the ER on 4/1 after not being to dialysis for a week and complaints of volume overload.  She has been admitted-  underwent HD on Saturday night- removed 4500 tolerated well-  vanc was continued-  is due to get a paracentesis possibly today.  We are asked to continue to provide her routine dialysis.  She states that she is feeling pretty good after HD ? ? ?Dialyzes at [facility] 4 hours   EDW 52. ?HD Bath 2/2, Dialyzer 180, Heparin only in cath. Access TDC.  mircera 200 q 2 weeks, hect 4 q tx and vanc 750 q tx til 4/4 ?On treatment 3/25 had BP of over 200 pre and post-  left out at 73.7 kg  ? ?Past Medical History:  ?Diagnosis Date  ? Anasarca associated with disorder of kidney 06/08/2021  ? Anxiety   ? Asthma   ? Bipolar depression ([place])   ? Depression   ? Dyspnea   ? ESRD (end stage renal disease) on dialysis ([place]) 10/2020  ? GERD (gastroesophageal reflux disease)   ? Gonorrhea   ? Lupus ([place])   ? Pelvic inflammatory disease (PID)   ? Pleural effusion 06/08/2021  ? Renal hypertension   ? Trichomonas infection   ? ? ?Past Surgical History:  ?Procedure Laterality Date  ? IR FLUORO GUIDE CV LINE RIGHT  11/30/2020  ? IR FLUORO GUIDE CV LINE RIGHT  06/13/2021  ? IR FLUORO GUIDE CV LINE RIGHT  02/01/2022  ? IR REMOVAL TUN CV CATH W/O FL  06/11/2021  ? IR REMOVAL TUN CV CATH W/O FL  01/29/2022  ? IR US GUIDE VASC ACCESS RIGHT  11/30/2020  ? IR US GUIDE VASC ACCESS RIGHT  06/13/2021  ? IR US GUIDE VASC ACCESS RIGHT  02/03/2022  ? RENAL BIOPSY    ? TEE  WITHOUT CARDIOVERSION N/A 06/13/2021  ? Procedure: TRANSESOPHAGEAL ECHOCARDIOGRAM (TEE);  Surgeon: Josue Hector, MD;  [facility] ENDOSCOPY;  Service: Cardiovascular;  Laterality: N/A;  ? TEE WITHOUT CARDIOVERSION N/A 02/03/2022  ? Procedure: TRANSESOPHAGEAL ECHOCARDIOGRAM (TEE);  Surgeon: Berniece Salines, DO;  Location: [facility];  Service: Cardiovascular;  Laterality: N/A;  ? TUBAL LIGATION N/A 02/03/2019  ? Procedure: POST PARTUM TUBAL LIGATION;  Surgeon: Aletha Halim, MD;  Location: [facility] LD ORS;  Service: Gynecology;  Laterality: N/A;  ? ? ?Family History  ?Problem Relation Age of Onset  ? Diabetes Maternal Grandmother   ? Hypertension Maternal Grandmother   ? Diabetes Maternal Grandfather   ? Hypertension Maternal Grandfather   ? Diabetes Paternal Grandmother   ? Hypertension Paternal Grandmother   ? Diabetes Paternal Grandfather   ? Hypertension Paternal Grandfather   ? ? ?Social History:  reports that she has been smoking cigarettes. She has been smoking an average of .25 packs per day. She has never used smokeless tobacco. She reports that she does not currently use alcohol. She reports that she does not currently use drugs after having used the following drugs: Marijuana. ? ?Allergies:  ?Allergies  ?Allergen  Reactions  ? Oxycodone Itching  ? ? ?Medications: I have reviewed the patient's current medications. ? ?Results for orders placed or performed during the hospital encounter of 03/01/22 (from the past 48 hour(s))  ?Resp Panel by RT-PCR (Flu A&B, Covid) Nasopharyngeal Swab     Status: None  ? Collection Time: 03/01/22  9:53 AM  ? Specimen: Nasopharyngeal Swab; Nasopharyngeal(NP) swabs in vial transport medium  ?Result Value Ref Range  ? SARS Coronavirus 2 by RT PCR NEGATIVE NEGATIVE  ?  Comment: (NOTE) ?SARS-CoV-2 target nucleic acids are NOT DETECTED. ? ?The SARS-CoV-2 RNA is generally detectable in upper respiratory ?specimens during the acute phase of infection. The lowest ?concentration of SARS-CoV-2  viral copies this assay can detect is ?138 copies/mL. A negative result does not preclude SARS-Cov-2 ?infection and should not be used as the sole basis for treatment or ?other patient management decisions. A negative result may occur with  ?improper specimen collection/handling, submission of specimen other ?than nasopharyngeal swab, presence of viral mutation(s) within the ?areas targeted by this assay, and inadequate number of viral ?copies(<138 copies/mL). A negative result must be combined with ?clinical observations, patient history, and epidemiological ?information. The expected result is Negative. ? ?Fact Sheet for Patients:  ?EntrepreneurPulse.com.au ? ?Fact Sheet for Healthcare Providers:  ?IncredibleEmployment.be ? ?This test is no t yet approved or cleared by the [place] FDA and  ?has been authorized for detection and/or diagnosis of SARS-CoV-2 by ?FDA under an Emergency Use Authorization (EUA). This EUA will remain  ?in effect (meaning this test can be used) for the duration of the ?COVID-19 declaration under Section 564(b)(1) of the Act, 21 ?U.S.C.section 360bbb-3(b)(1), unless the authorization is terminated  ?or revoked sooner.  ? ? ?  ? Influenza A by PCR NEGATIVE NEGATIVE  ? Influenza B by PCR NEGATIVE NEGATIVE  ?  Comment: (NOTE) ?The Xpert Xpress SARS-CoV-2/FLU/RSV plus assay is intended as an aid ?in the diagnosis of influenza from Nasopharyngeal swab specimens and ?should not be used as a sole basis for treatment. Nasal washings and ?aspirates are unacceptable for Xpert Xpress SARS-CoV-2/FLU/RSV ?testing. ? ?Fact Sheet for Patients: ?EntrepreneurPulse.com.au ? ?Fact Sheet for Healthcare Providers: ?IncredibleEmployment.be ? ?This test is not yet approved or cleared by the [place] FDA and ?has been authorized for detection and/or diagnosis of SARS-CoV-2 by ?FDA under an Emergency Use Authorization (EUA). This EUA  will remain ?in effect (meaning this test can be used) for the duration of the ?COVID-19 declaration under Section 564(b)(1) of the Act, 21 U.S.C. ?section 360bbb-3(b)(1), unless the authorization is terminated or ?revoked. ? ?Performed at [facility], [address], [place], [place] [address] ?  ?Comprehensive metabolic panel     Status: Abnormal  ? Collection Time: 03/01/22 10:40 AM  ?Result Value Ref Range  ? Sodium 136 135 - 145 mmol/L  ? Potassium 4.9 3.5 - 5.1 mmol/L  ? Chloride 96 (L) 98 - 111 mmol/L  ? CO2 22 22 - 32 mmol/L  ? Glucose, Bld 90 70 - 99 mg/dL  ?  Comment: Glucose reference range applies only to samples taken after fasting for at least 8 hours.  ? BUN 67 (H) 6 - 20 mg/dL  ? Creatinine, Ser 14.32 (H) 0.44 - 1.00 mg/dL  ? Calcium 8.8 (L) 8.9 - 10.3 mg/dL  ? Total Protein 7.0 6.5 - 8.1 g/dL  ? Albumin 3.3 (L) 3.5 - 5.0 g/dL  ? AST 22 15 - 41 U/L  ? ALT 12 0 - 44 U/L  ?  Alkaline Phosphatase 288 (H) 38 - 126 U/L  ? Total Bilirubin 0.9 0.3 - 1.2 mg/dL  ? GFR, Estimated 3 (L) >60 mL/min  ?  Comment: (NOTE) ?Calculated using the CKD-EPI Creatinine Equation (2021) ?  ? Anion gap 18 (H) 5 - 15  ?  Comment: Performed at [facility], [address], [place], [place] [address]  ?CBC with Differential     Status: Abnormal  ? Collection Time: 03/01/22 10:40 AM  ?Result Value Ref Range  ? WBC 7.9 4.0 - 10.5 K/uL  ? RBC 4.32 3.87 - 5.11 MIL/uL  ? Hemoglobin 12.0 12.0 - 15.0 g/dL  ? HCT 39.8 36.0 - 46.0 %  ? MCV 92.1 80.0 - 100.0 fL  ? MCH 27.8 26.0 - 34.0 pg  ? MCHC 30.2 30.0 - 36.0 g/dL  ? RDW 20.6 (H) 11.5 - 15.5 %  ? Platelets 272 150 - 400 K/uL  ? nRBC 0.0 0.0 - 0.2 %  ? Neutrophils Relative % 67 %  ? Neutro Abs 5.3 1.7 - 7.7 K/uL  ? Lymphocytes Relative 21 %  ? Lymphs Abs 1.7 0.7 - 4.0 K/uL  ? Monocytes Relative 9 %  ? Monocytes Absolute 0.7 0.1 - 1.0 K/uL  ? Eosinophils Relative 1 %  ? Eosinophils Absolute 0.1 0.0 - 0.5 K/uL  ? Basophils Relative 1 %  ? Basophils Absolute 0.1 0.0 - 0.1 K/uL  ? Immature  Granulocytes 1 %  ? Abs Immature Granulocytes 0.07 0.00 - 0.07 K/uL  ?  Comment: Performed at [facility], [address] [place], [place] [address]  ?Troponin I (High Sensitivity)     Status: Abnormal  ?

## 2022-03-03 NOTE — Progress Notes (Signed)
?PROGRESS NOTE ? ? ?Cathy [name]  JSR:[id number] DOB: 1981/07/20 DOA: 03/01/2022 ?PCP: Pcp, No  ? ?Chief Complaint  ?Patient presents with  ? Shortness of Breath  ?  Missed dialysis x2  ? ?Level of care: Telemetry ? ?Brief Admission History:  ?41 year old female with ESRD T, Th, Sat, missed HD for past week since 02/22/22, recently discharged from [facility] after a long hospitalization for MRSA bacteremia was supposed to be getting vancomycin with HD thru 03/04/22.  She missed outpatient appt with ID Dr. [place] Bali. She has long history of noncompliance. She is not taking her medications as prescribed.  Her blood pressure was noted to be markedly elevated at her PCP visit and she refused to go to ER. She presented to ED at [facility] today complaining of shortness of breath after missing HD for 1 week.  She also reports abdominal pain and distension that has gotten worse over the last week. Nephrology was planning to arrange outpatient follow up with vascular surgery for eventual AVF placement.  She now has a TDC placed by IR on 3/4 as a replacement for the one that was removed on 3/1 when she was found to have bacteremia.  Her TEE was negative for vegetations on 01/30/22. She tested Covid positive on 01/29/22.  She had an ascites [place] survey on 3/10 and only trace ascites was found and no paracentesis was done.  Today her CT scan is showing a large amount of ascites and she likely will need paracentesis.  Fortunately her potassium was 4.9 today.  Nephrology was consulted and admission was requested for hemodialysis and also paracentesis when available. Pt denies fever and chills, denies chest pain but continues to complain of abdominal pain and distension and SOB.  CXR shows mild edema.  ? ?03/02/2022:  pt had HD on 4/1 and 4.5L was removed. Abdomen remains very distended and painful.  Unable to get paracentesis at this hospital until 4/3.  ? ?03/03/2022:  pt feels less SOB now but not back to baseline. Abdominal distension  remains and painful. She is agreeable to getting a paracentesis done.    ?  ?Assessment and Plan: ?Volume overload ?--due to noncompliance with diet, medications and hemodialysis treatments.  ?--consulted to nephrology for hemodialysis;  Pt had HD on 4/1 with 4.5 L removed.  ?--paracentesis when available (not available at [facility] on weekends) ?--further HD planned for tomorrow with more volume removal.  ? ?Hypertensive urgency ?--due to not taking blood pressure medication ?--fortunately the BP improved when ED gave patient her BP meds today.  ?--plan to resume home BP meds ? ?Malignant hypertension ?--when blood pressure meds are taken BP seems to be much better controlled ?--plan to resume home meds ? ?ESRD on hemodialysis ([place]) ?--Pt has missed 1 week of dialysis, last reported treatment was 3/25 ?--consult to nephrology for HD treatment while in hospital ?--Pt had HD on 4/1 ?--further HD planned for 4/3 ? ?Ascites ?--large amount of ascites per CT scan ?--planning for paracentesis on 4/3 ?--HOLD APIXABAN FURTHER APIXABAN FOR PARACENTESIS  ?--paracentesis ordered for today ? ?Tobacco dependence ?--will offer nicotine patch while in hospital ?--strongly advised patient to stop all tobacco use ? ?MRSA bacteremia ?--pt was treated at [facility] and after discharge was supposed to do vancomycin with HD thru 03/04/22.  Unfortunately she no-showed for her ID appt and she missed last week of HD.  Currently has normal WBC and no fever.  Resume vancomycin with HD but might need to extend treatment for  1 week.   ?--Pt received vancomycin with HD on 03/01/22.  She was due to complete this on 4/4 however has missed 1 full week of HD treatment. Should extend therapy for 1 additional week.  ? ?History of pulmonary embolus ?--temporarily hold further apixaban today for possible paracentesis on 4/3 ?--resume after paracentesis ? ?HFrEF (heart failure with reduced ejection fraction) ([place]) ?--pt has referral for cardiology follow up ?--It appears  that she did not show for the 02/13/22 appt ? 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 ?Pericardial effusion-chronic  ?--chronic issue related to poor compliance with dialysis ?--asymptomatic at this time ? ?Current chronic use of systemic steroids ?--resume home prednisone therapy ? ?Non compliance w medication regimen ?--longstanding problem that will eventually lead to patient's death, I counseled with her today and she verbalized understanding ? ?Substance abuse ([place]) ?--careful with controlled substances ? ?Systemic lupus erythematosus (SLE) with pericarditis ([place]) ?--pt has an outpatient referral to rheumatology in process ?--remains on plaquenil and prednisone ? ?Asthma ?--no wheezing or hypoxia at this time ?--following ? ? ?DVT prophylaxis: apixaban (on hold for paracentesis 4/3) SCDs ?Code Status: full  ?Family Communication:  ?Disposition: Status is: Observation ?The patient remains OBS appropriate and will d/c before 2 midnights. ?  ?Consultants:  ?nephrology ?Procedures:  ?Hemodialysis 03/01/22 ?Antimicrobials:  ?Vancomycin with hemodialysis   ?Subjective: ?Pt reports less SOB after HD.  Still with painful swollen abdomen.   ?Objective: ?Vitals:  ? 03/02/22 0616 03/02/22 1430 03/02/22 2045 03/03/22 0537  ?BP: (!) 147/112 113/72 (!) 134/101 (!) 148/113  ?Pulse: 64 63 60 63  ?Resp: 19 17 17 16   ?Temp: 98.6 ?F (37 ?C) (!) 97.4 ?F (36.3 ?C) 98.8 ?F (37.1 ?C) 98.6 ?F (37 ?C)  ?TempSrc: Oral  Oral Oral  ?SpO2: 100% 98% 100% 98%  ?Weight:      ?Height:      ? ? ?Intake/Output Summary (Last 24 hours) at 03/03/2022 1210 ?Last data filed at 03/03/2022 0900 ?Gross per 24 hour  ?Intake 240 ml  ?Output --  ?Net 240 ml  ? ?Filed Weights  ? 03/01/22 0946 03/01/22 1749 03/01/22 2000  ?Weight: 75.1 kg 74.9 kg 74.9 kg  ? ?Examination: ? ?General exam: chronically ill appearing female, Appears calm and comfortable  ?Respiratory system: crackles RLL. Respiratory effort  normal. ?Cardiovascular system: normal S1 & S2 heard. No JVD, murmurs, rubs, gallops or clicks. No pedal edema. ?Gastrointestinal system: Abdomen is markedly distended with fluid waves, tense and tender to palpation. No organomegaly or masses felt. Normal bowel sounds heard. ?Central nervous system: Alert and oriented. No focal neurological deficits. ?Extremities: Symmetric 5 x 5 power. ?Skin: No rashes, lesions or ulcers. ?Psychiatry: Judgement and insight appear poor. Mood & affect appropriate.  ? ?Data Reviewed: I have personally reviewed following labs and imaging studies ? ?CBC: ?Recent Labs  ?Lab 03/01/22 ?1040  ?WBC 7.9  ?NEUTROABS 5.3  ?HGB 12.0  ?HCT 39.8  ?MCV 92.1  ?PLT 272  ? ? ?Basic Metabolic Panel: ?Recent Labs  ?Lab 03/01/22 ?1040 03/02/22 ?7517 03/03/22 ?0448  ?NA 136 135 135  ?K 4.9 3.6 4.1  ?CL 96* 97* 95*  ?CO2 22 25 26   ?GLUCOSE 90 78 120*  ?BUN 67* 30* 41*  ?CREATININE 14.32* 8.53* 10.06*  ?CALCIUM 8.8* 7.9* 8.2*  ?MG  --  2.0  --   ?PHOS  --  3.4 4.0  ? ? ?CBG: ?No results for input(s): GLUCAP in the last 168 hours. ? ?Recent Results (  from the past 240 hour(s))  ?Resp Panel by RT-PCR (Flu A&B, Covid) Nasopharyngeal Swab     Status: None  ? Collection Time: 03/01/22  9:53 AM  ? Specimen: Nasopharyngeal Swab; Nasopharyngeal(NP) swabs in vial transport medium  ?Result Value Ref Range Status  ? SARS Coronavirus 2 by RT PCR NEGATIVE NEGATIVE Final  ?  Comment: (NOTE) ?SARS-CoV-2 target nucleic acids are NOT DETECTED. ? ?The SARS-CoV-2 RNA is generally detectable in upper respiratory ?specimens during the acute phase of infection. The lowest ?concentration of SARS-CoV-2 viral copies this assay can detect is ?138 copies/mL. A negative result does not preclude SARS-Cov-2 ?infection and should not be used as the sole basis for treatment or ?other patient management decisions. A negative result may occur with  ?improper specimen collection/handling, submission of specimen other ?than nasopharyngeal  swab, presence of viral mutation(s) within the ?areas targeted by this assay, and inadequate number of viral ?copies(<138 copies/mL). A negative result must be combined with ?clinical observations, patient history, and

## 2022-03-03 NOTE — TOC Initial Note (Signed)
Transition of Care (TOC) - Initial/Assessment Note  ? ? ?Patient Details  ?Name: Cathy [name] ?MRN: [id number] ?Date of Birth: 10/25/1981 ? ?Transition of Care (TOC) CM/SW Contact:    ?Salome Arnt, LCSW ?Phone Number: ?03/03/2022, 2:01 PM ? ?Clinical Narrative:  Pt admitted due to volume overload. Assessment completed for high risk readmission score. Pt reports she has been staying in hotel recently as she had to leave her housing in [place]. She will either be going to stay with her mother in [place] or her sister in [place] after d/c. Pt requesting to transfer dialysis from [facility] to [facility] in [place]. She was on TTS 1st shift at [facility] and had missed last several treatments. LCSW spoke with Education officer, museum and ward clerk who will work on transfer request. Pt states she is on 2L home O2, but is unsure what agency it is through. She indicates she had to leave portable tank and concentrator at her last house for some reason and needs new ones. Pt plans to find out what agency so TOC can contact with request. TOC will continue to follow.                 ? ? ?Expected Discharge Plan: Home/Self Care ?Barriers to Discharge: Continued Medical Work up ? ? ?Patient Goals and CMS Choice ?Patient states their goals for this hospitalization and ongoing recovery are:: transfer to [facility] ?  ?Choice offered to / list presented to : Patient ? ?Expected Discharge Plan and Services ?Expected Discharge Plan: Home/Self Care ?In-house Referral: Clinical Social Work ?  ?  ?Living arrangements for the past 2 months: No permanent address ?                ?  ?  ?  ?  ?  ?  ?  ?  ?  ?  ? ?Prior Living Arrangements/Services ?Living arrangements for the past 2 months: No permanent address ?Lives with:: Self ?Patient language and need for interpreter reviewed:: Yes ?Do you feel safe going back to the place where you live?: Yes      ?Need for Family Participation in Patient Care: No  (Comment) ?  ?Current home services: DME (home O2) ?Criminal Activity/Legal Involvement Pertinent to Current Situation/Hospitalization: No - Comment as needed ? ?Activities of Daily Living ?Home Assistive Devices/Equipment: None ?ADL Screening (condition at time of admission) ?Patient's cognitive ability adequate to safely complete daily activities?: Yes ?Is the patient deaf or have difficulty hearing?: No ?Does the patient have difficulty seeing, even when wearing glasses/contacts?: No ?Does the patient have difficulty concentrating, remembering, or making decisions?: No ?Patient able to express need for assistance with ADLs?: Yes ?Does the patient have difficulty dressing or bathing?: No ?Independently performs ADLs?: Yes (appropriate for developmental age) ?Does the patient have difficulty walking or climbing stairs?: No ?Weakness of Legs: None ?Weakness of Arms/Hands: None ? ?Permission Sought/Granted ?  ?  ?   ?   ?   ?   ? ?Emotional Assessment ?  ?  ?Affect (typically observed): Appropriate ?Orientation: : Oriented to Self, Oriented to Place, Oriented to  Time, Oriented to Situation ?Alcohol / Substance Use: Not Applicable ?Psych Involvement: No (comment) ? ?Admission diagnosis:  ESRD (end stage renal disease) ([place]) [N18.6] ?Other ascites [R18.8] ?Volume overload [E87.70] ?Patient Active Problem List  ? Diagnosis Date Noted  ? Ascites 03/02/2022  ? Volume overload 03/01/2022  ? MRSA bacteremia 01/29/2022  ? Tobacco dependence 01/29/2022  ? History  of pulmonary embolus 01/07/2022  ? Malignant hypertension 01/06/2022  ? Hypertensive urgency 01/06/2022  ? Anemia of chronic kidney failure 12/28/2021  ? Chronic combined systolic and diastolic congestive heart failure ([place]) 12/27/2021  ? PNA (pneumonia) 12/15/2021  ? Benign essential HTN 06/08/2021  ? HFrEF (heart failure with reduced ejection fraction) ([place])   ? Encounter for antineoplastic chemotherapy 01/07/2021  ? ESRD on hemodialysis ([place]) 12/23/2020  ? Lupus  nephritis ([place]) 12/21/2020  ? Hyperkalemia 11/29/2020  ? Pericardial effusion-chronic    ? Aortic atherosclerosis ([place]) 11/07/2020  ? Pelvic adhesive disease 02/03/2019  ? Renal hypertension 02/02/2019  ? Non compliance w medication regimen 01/12/2019  ? Current chronic use of systemic steroids 01/12/2019  ? Anemia 10/12/2018  ? Systemic lupus erythematosus (SLE) with pericarditis ([place]) 09/20/2018  ? Supervision of pregnancy with grand multiparity in second trimester 09/20/2018  ? Substance abuse ([place]) 09/20/2018  ? MDD (major depressive disorder) 10/11/2015  ? Asthma 09/22/2011  ? ?PCP:  Pcp, No ?Pharmacy:   ?[facility] ?[address] ?[place] [address] ?Phone: [phone] Fax: [phone] ? ?[id number] [place], [place] ?[address] ?[place] [address] ?Phone: [phone] Fax: [phone] ? ? ? ? ?Social Determinants of Health (SDOH) Interventions ?  ? ?Readmission Risk Interventions ? ?  03/03/2022  ?  1:58 PM  ?Readmission Risk Prevention Plan  ?Transportation Screening Complete  ?Medication Review Press photographer) Complete  ?[place] or Home Care Consult Complete  ?SW Recovery Care/Counseling Consult Complete  ?Palliative Care Screening Not Applicable  ?[place] Not Applicable  ? ? ? ?

## 2022-03-03 NOTE — Progress Notes (Signed)
Right sided paracentesis procedure completed at this time and 3 Liters of clear amber colored fluid removed with labs collected and sent for processing. PT verbalized understanding of post procedure instructions and returned to inpatient bed assignment at this time via stretcher with no acute distress noted.  ?

## 2022-03-03 NOTE — Procedures (Signed)
PreOperative Dx: Ascites ?Postoperative Dx: Ascites ?Procedure:   US guided paracentesis ?Radiologist:  Thornton Papas ?Anesthesia:  10 ml of1% lidocaine ?Specimen:  3.0 L of amber ascitic fluid ?EBL:   < 1 ml ?Complications: None   ?

## 2022-03-04 DIAGNOSIS — R188 Other ascites: Secondary | ICD-10-CM | POA: Diagnosis not present

## 2022-03-04 DIAGNOSIS — I2699 Other pulmonary embolism without acute cor pulmonale: Secondary | ICD-10-CM | POA: Diagnosis not present

## 2022-03-04 DIAGNOSIS — I700 Atherosclerosis of aorta: Secondary | ICD-10-CM | POA: Diagnosis not present

## 2022-03-04 DIAGNOSIS — Z7952 Long term (current) use of systemic steroids: Secondary | ICD-10-CM | POA: Diagnosis not present

## 2022-03-04 LAB — CBC
HCT: 35.5 % — ABNORMAL LOW (ref 36.0–46.0)
Hemoglobin: 10.7 g/dL — ABNORMAL LOW (ref 12.0–15.0)
MCH: 27.8 pg (ref 26.0–34.0)
MCHC: 30.1 g/dL (ref 30.0–36.0)
MCV: 92.2 fL (ref 80.0–100.0)
Platelets: 250 10*3/uL (ref 150–400)
RBC: 3.85 MIL/uL — ABNORMAL LOW (ref 3.87–5.11)
RDW: 19.9 % — ABNORMAL HIGH (ref 11.5–15.5)
WBC: 6.3 10*3/uL (ref 4.0–10.5)
nRBC: 0 % (ref 0.0–0.2)

## 2022-03-04 LAB — RENAL FUNCTION PANEL
Albumin: 2.8 g/dL — ABNORMAL LOW (ref 3.5–5.0)
Anion gap: 14 (ref 5–15)
BUN: 54 mg/dL — ABNORMAL HIGH (ref 6–20)
CO2: 24 mmol/L (ref 22–32)
Calcium: 7.9 mg/dL — ABNORMAL LOW (ref 8.9–10.3)
Chloride: 95 mmol/L — ABNORMAL LOW (ref 98–111)
Creatinine, Ser: 11.09 mg/dL — ABNORMAL HIGH (ref 0.44–1.00)
GFR, Estimated: 4 mL/min — ABNORMAL LOW (ref >60)
Glucose, Bld: 122 mg/dL — ABNORMAL HIGH (ref 70–99)
Phosphorus: 4.5 mg/dL (ref 2.5–4.6)
Potassium: 4.7 mmol/L (ref 3.5–5.1)
Sodium: 133 mmol/L — ABNORMAL LOW (ref 135–145)

## 2022-03-04 LAB — PATHOLOGIST SMEAR REVIEW

## 2022-03-04 MED ORDER — APIXABAN 5 MG PO TABS
5.0000 mg | ORAL_TABLET | Freq: Two times a day (BID) | ORAL | Status: DC
  Administered 2022-03-04 – 2022-03-05 (×2): 5 mg via ORAL
  Filled 2022-03-04 (×2): qty 1

## 2022-03-04 MED ORDER — CLONIDINE HCL 0.2 MG PO TABS
0.2000 mg | ORAL_TABLET | Freq: Three times a day (TID) | ORAL | Status: DC
  Administered 2022-03-04 – 2022-03-05 (×3): 0.2 mg via ORAL
  Filled 2022-03-04 (×4): qty 1

## 2022-03-04 MED ORDER — FENTANYL CITRATE PF 50 MCG/ML IJ SOSY
25.0000 ug | PREFILLED_SYRINGE | INTRAMUSCULAR | Status: DC | PRN
  Administered 2022-03-04 – 2022-03-05 (×4): 25 ug via INTRAVENOUS
  Filled 2022-03-04 (×4): qty 1

## 2022-03-04 MED ORDER — HEPARIN SODIUM (PORCINE) 1000 UNIT/ML DIALYSIS
20.0000 [IU]/kg | INTRAMUSCULAR | Status: DC | PRN
  Administered 2022-03-04: 1500 [IU] via INTRAVENOUS_CENTRAL

## 2022-03-04 MED ORDER — DIPHENHYDRAMINE HCL 50 MG/ML IJ SOLN
50.0000 mg | Freq: Once | INTRAMUSCULAR | Status: AC
  Administered 2022-03-04: 50 mg via INTRAVENOUS
  Filled 2022-03-04: qty 1

## 2022-03-04 MED ORDER — LOSARTAN POTASSIUM 50 MG PO TABS
100.0000 mg | ORAL_TABLET | Freq: Every day | ORAL | Status: DC
  Administered 2022-03-04: 100 mg via ORAL
  Filled 2022-03-04: qty 2

## 2022-03-04 NOTE — Progress Notes (Signed)
Patient off unit at this time for hemo still. ?

## 2022-03-04 NOTE — Progress Notes (Signed)
Subjective:  Underwent paracentesis removal of 3 liters-  stable overnight otherwise-  planning for HD today -  notice that pt requesting transfer to HD in [facility]-  going to live with her Mom at D/C ? ?Objective ?Vital signs in last 24 hours: ?Vitals:  ? 03/03/22 1240 03/03/22 1348 03/03/22 2053 03/04/22 0454  ?BP: (!) 135/102 (!) 143/107 (!) 141/108 (!) 155/123  ?Pulse: 61 64 60 62  ?Resp: 18 15 18 16   ?Temp:  98 ?F (36.7 ?C) 97.9 ?F (36.6 ?C) 97.7 ?F (36.5 ?C)  ?TempSrc:  Oral Oral Oral  ?SpO2: 99% 100% 100% 100%  ?Weight:      ?Height:      ? ?Weight change:  ? ?Intake/Output Summary (Last 24 hours) at 03/04/2022 5809 ?Last data filed at 03/04/2022 0500 ?Gross per 24 hour  ?Intake 960 ml  ?Output --  ?Net 960 ml  ? ?Dialyzes at [place] 4 hours   EDW 52. ?HD Bath 2/2, Dialyzer 180, Heparin only in cath. Access TDC.  mircera 200 q 2 weeks, hect 4 q tx and vanc 750 q tx til 4/4 ?On treatment 3/25 had BP of over 200 pre and post-  left out at 73.7 kg  ? ? ?Assessment/Plan: 41 year old BF-  severe HTN and ESRD as well as medical non compliance and recent MRSA bacteremia presents after a week of no HD and volume overload  ?1 volume overload/ascites-  after no HD for a week-  s/p paracentesis of 3 liters ?2 ESRD: normally TTS via [facility]-  has been non compliant with HD-  underwent Saturday with good results-  plan for routine HD today-  much volume removal again-  wanting to transfer to [facility]-  I will see if I can help that because she is ready to go home. She is willing to get AVF  just waiting for bacteremia to clear-  close ?3 Hypertension: severe on admission as well as in the OP setting -  has come down with norvasc/clonidine/cozaar/coreg. Clonidine is probably the worse med for her-  if she could be more compliant might be able to wean as OP- I will start by increasing her cozaar to 100 and taking clonidine down to 0.2 and cont to wean as able .  Needs a new EDW-  not even close-  still has fluid on board,  will be aggressive with UF tomorrow  ?4. Anemia of ESRD: hgb is actually great-  no ESA needed here ?5. Metabolic Bone Disease: will continue her home hectorol and is also ordered renvela and phoslo.  Phos is great  ?6. ID- previous MRSA bacteremia req line change earlier this month-  new TDC looks fine-  giving vanc with HD here-  I agree can lengthen her course by a week ( if she goes to OP) ?7. Dispo-  now new wrinkle of changing HD units-  if can get that figured out could go today   ?  ? ?Louis Meckel  ? ? ?Labs: ?Basic Metabolic Panel: ?Recent Labs  ?Lab 03/02/22 ?9833 03/03/22 ?0448 03/04/22 ?8250  ?NA 135 135 133*  ?K 3.6 4.1 4.7  ?CL 97* 95* 95*  ?CO2 25 26 24   ?GLUCOSE 78 120* 122*  ?BUN 30* 41* 54*  ?CREATININE 8.53* 10.06* 11.09*  ?CALCIUM 7.9* 8.2* 7.9*  ?PHOS 3.4 4.0 4.5  ? ?Liver Function Tests: ?Recent Labs  ?Lab 03/01/22 ?1040 03/02/22 ?5397 03/03/22 ?0448 03/04/22 ?6734  ?AST 22  --   --   --   ?  ALT 12  --   --   --   ?ALKPHOS 288*  --   --   --   ?BILITOT 0.9  --   --   --   ?PROT 7.0  --   --   --   ?ALBUMIN 3.3* 2.7* 2.9* 2.8*  ? ?Recent Labs  ?Lab 03/01/22 ?1040  ?LIPASE 28  ? ?No results for input(s): AMMONIA in the last 168 hours. ?CBC: ?Recent Labs  ?Lab 03/01/22 ?1040  ?WBC 7.9  ?NEUTROABS 5.3  ?HGB 12.0  ?HCT 39.8  ?MCV 92.1  ?PLT 272  ? ?Cardiac Enzymes: ?No results for input(s): CKTOTAL, CKMB, CKMBINDEX, TROPONINI in the last 168 hours. ?CBG: ?No results for input(s): GLUCAP in the last 168 hours. ? ?Iron Studies: No results for input(s): IRON, TIBC, TRANSFERRIN, FERRITIN in the last 72 hours. ?Studies/Results: ?US Paracentesis ? ?Result Date: 03/03/2022 ?INDICATION: Ascites EXAM: ULTRASOUND GUIDED DIAGNOSTIC AND THERAPEUTIC PARACENTESIS MEDICATIONS: None. COMPLICATIONS: None immediate. PROCEDURE: Informed written consent was obtained from the patient after a discussion of the risks, benefits and alternatives to treatment. A timeout was performed prior to the initiation of the  procedure. Initial ultrasound scanning demonstrates a large amount of ascites within the right lower abdominal quadrant. The right lower abdomen was prepped and draped in the usual sterile fashion. 1% lidocaine was used for local anesthesia. Following this, a 5 [place] Yueh catheter was introduced. An ultrasound image was saved for documentation purposes. The paracentesis was performed. The catheter was removed and a dressing was applied. The patient tolerated the procedure well without immediate post procedural complication. FINDINGS: A total of approximately 3.0 L of amber colored ascitic fluid was removed. Samples were sent to the laboratory as requested by the clinical team. IMPRESSION: Successful ultrasound-guided paracentesis yielding 3.0 liters of peritoneal fluid. Electronically Signed   By: Lavonia Dana M.D.   On: 03/03/2022 12:57   ?Medications: ?Infusions: ? sodium chloride    ? sodium chloride    ? vancomycin 750 mg (03/01/22 2310)  ? ? ?Scheduled Medications: ? amLODipine  10 mg Oral Daily  ? calcium acetate  1,334 mg Oral TID WC  ? carvedilol  25 mg Oral BID WC  ? Chlorhexidine Gluconate Cloth  6 each Topical Q0600  ? Chlorhexidine Gluconate Cloth  6 each Topical Q0600  ? cloNIDine  0.3 mg Oral Q8H  ? hydroxychloroquine  100 mg Oral Q48H  ? losartan  50 mg Oral QHS  ? nicotine  21 mg Transdermal Daily  ? predniSONE  20 mg Oral Q breakfast  ? sevelamer carbonate  800 mg Oral TID WC  ? ? have reviewed scheduled and prn medications. ? ?Physical Exam: ?General:  NAD-  pleasant ?Heart: RRR ?Lungs: dec BS at bases ?Abdomen: still distended but better ?Extremities: pitting edema ?Dialysis Access: right sided TDC   ? ? ?03/04/2022,7:12 AM ? LOS: 2 days  ? ?  ? ? ? ? ?

## 2022-03-04 NOTE — Progress Notes (Signed)
?PROGRESS NOTE ? ? ?Cathy [name]  DGU:[id number] DOB: 03/29/1981 DOA: 03/01/2022 ?PCP: Pcp, No  ? ?Chief Complaint  ?Patient presents with  ? Shortness of Breath  ?  Missed dialysis x2  ? ?Level of care: Telemetry ? ?Brief Admission History:  ?41 year old female with ESRD T, Th, Sat, missed HD for past week since 02/22/22, recently discharged from [facility] after a long hospitalization for MRSA bacteremia was supposed to be getting vancomycin with HD thru 03/04/22.  She missed outpatient appt with ID Dr. [place] Bali. She has long history of noncompliance. She is not taking her medications as prescribed.  Her blood pressure was noted to be markedly elevated at her PCP visit and she refused to go to ER. She presented to ED at [facility] today complaining of shortness of breath after missing HD for 1 week.  She also reports abdominal pain and distension that has gotten worse over the last week. Nephrology was planning to arrange outpatient follow up with vascular surgery for eventual AVF placement.  She now has a TDC placed by IR on 3/4 as a replacement for the one that was removed on 3/1 when she was found to have bacteremia.  Her TEE was negative for vegetations on 01/30/22. She tested Covid positive on 01/29/22.  She had an ascites [place] survey on 3/10 and only trace ascites was found and no paracentesis was done.  Today her CT scan is showing a large amount of ascites and she likely will need paracentesis.  Fortunately her potassium was 4.9 today.  Nephrology was consulted and admission was requested for hemodialysis and also paracentesis when available. Pt denies fever and chills, denies chest pain but continues to complain of abdominal pain and distension and SOB.  CXR shows mild edema.  ? ?03/02/2022:  pt had HD on 4/1 and 4.5L was removed. Abdomen remains very distended and painful.  Unable to get paracentesis at this hospital until 4/3.  ? ?03/03/2022:  pt feels less SOB now but not back to baseline. Abdominal distension  remains and painful. She is agreeable to getting a paracentesis done.    ? ?03/04/2022:  Pt reports that she is having gas pain and flatulence. She is still having abdominal discomfort but markedly improved since paracentesis.   ?  ?Assessment and Plan: ?Volume overload ?--due to noncompliance with diet, medications and hemodialysis treatments.  ?--consulted to nephrology for hemodialysis;  Pt had HD on 4/1 with 4.5 L removed.  ?--paracentesis when available (not available at [facility] on weekends) ?--further HD planned today for more volume removal.  ? ?Hypertensive urgency ?--due to not taking blood pressure medication ?--fortunately the BP improved when given regular home BP meds  ?--resolved now ? ?Malignant hypertension ?--when blood pressure meds are taken BP seems to be much better controlled ?--continue home meds with recent adjustments made by nephrology  ? ?ESRD on hemodialysis ([place]) ?--Pt has missed 1 week of dialysis, last reported treatment was 3/25 ?--consult to nephrology for HD treatment while in hospital ?--Pt had HD on 4/1 ?--further HD planned for 4/3 ? ?Ascites ?--large amount of ascites per CT scan ?--planning for paracentesis on 4/3 ?--HELD APIXABAN FOR PARACENTESIS  ?--paracentesis completed with 3L fluid removed ?--restarted apixaban  ? ?Tobacco dependence ?--will offer nicotine patch while in hospital ?--strongly advised patient to stop all tobacco use ? ?MRSA bacteremia ?--pt was treated at [facility] and after discharge was supposed to do vancomycin with HD thru 03/04/22.  Unfortunately she no-showed for her ID appt  and she missed last week of HD.  Currently has normal WBC and no fever.  Resume vancomycin with HD but might need to extend treatment for 1 week.   ?--Pt received vancomycin with HD on 03/01/22.  She was due to complete this on 4/4 however has missed 1 full week of HD treatment. Should extend therapy for 1 additional week.  ? ?History of pulmonary embolus ?--temporarily hold further apixaban today  for possible paracentesis on 4/3 ?--resume after paracentesis ?--resume apixaban 4/4  ? ?HFrEF (heart failure with reduced ejection fraction) ([place]) ?--pt has referral for cardiology follow up ?--It appears that she did not show for the 02/13/22 appt ? ?Benign essential HTN ?--resumed home meds with some recent changes made by nephrology service  ? 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 ?Pericardial effusion-chronic  ?--chronic issue related to poor compliance with dialysis ?--asymptomatic at this time ? ?Aortic atherosclerosis ([place]) ?--stable  ? ?Current chronic use of systemic steroids ?--resume home prednisone therapy ? ?Non compliance w medication regimen ?--longstanding problem that could eventually lead to patient's death if she does not change this behavior, I counseled with her today and she verbalized understanding ? ?Substance abuse ([place]) ?--careful with controlled substances ? ?Systemic lupus erythematosus (SLE) with pericarditis ([place]) ?--pt has an outpatient referral to rheumatology in process ?--remains on plaquenil and prednisone ? ?Asthma ?--no wheezing or hypoxia at this time ?--following ? ?DVT prophylaxis: apixaban ?Code Status: full  ?Family Communication:  ?Disposition: Status is: Observation ?The patient remains OBS appropriate and will d/c before 2 midnights. ?  ?Consultants:  ?nephrology ?Procedures:  ?Hemodialysis 03/01/22 ?Antimicrobials:  ?Vancomycin with hemodialysis   ?Subjective: ?Pt having lots of flatus and gas pain but overall less abdominal distension after paracentesis  ?Objective: ?Vitals:  ? 03/04/22 1440 03/04/22 1450 03/04/22 1500 03/04/22 1530  ?BP: 137/79 (!) 133/95 (!) 140/99 (!) 144/100  ?Pulse: (!) 59 (!) 57 (!) 58 (!) 56  ?Resp: 18     ?Temp: 98.2 ?F (36.8 ?C)     ?TempSrc: Oral     ?SpO2: 99%     ?Weight: 70 kg     ?Height:      ? ? ?Intake/Output Summary (Last 24 hours) at 03/04/2022 1557 ?Last data filed at 03/04/2022 1300 ?Gross  per 24 hour  ?Intake 960 ml  ?Output --  ?Net 960 ml  ? ?Filed Weights  ? 03/01/22 1749 03/01/22 2000 03/04/22 1440  ?Weight: 74.9 kg 74.9 kg 70 kg  ? ?Examination: ? ?General exam: chronically ill appearing female, Appears calm and comfortable  ?Respiratory system: crackles RLL. Respiratory effort normal. ?Cardiovascular system: normal S1 & S2 heard. No JVD, murmurs, rubs, gallops or clicks. No pedal edema. ?Gastrointestinal system: Abdomen is markedly distended with fluid waves, tense and tender to palpation. No organomegaly or masses felt. Normal bowel sounds heard. ?Central nervous system: Alert and oriented. No focal neurological deficits. ?Extremities: Symmetric 5 x 5 power. ?Skin: No rashes, lesions or ulcers. ?Psychiatry: Judgement and insight appear poor. Mood & affect appropriate.  ? ?Data Reviewed: I have personally reviewed following labs and imaging studies ? ?CBC: ?Recent Labs  ?Lab 03/01/22 ?1040  ?WBC 7.9  ?NEUTROABS 5.3  ?HGB 12.0  ?HCT 39.8  ?MCV 92.1  ?PLT 272  ? ? ?Basic Metabolic Panel: ?Recent Labs  ?Lab 03/01/22 ?1040 03/02/22 ?9924 03/03/22 ?0448 03/04/22 ?2683  ?NA 136 135 135 133*  ?K 4.9 3.6 4.1 4.7  ?CL 96* 97* 95* 95*  ?CO2 22  25 26 24   ?GLUCOSE 90 78 120* 122*  ?BUN 67* 30* 41* 54*  ?CREATININE 14.32* 8.53* 10.06* 11.09*  ?CALCIUM 8.8* 7.9* 8.2* 7.9*  ?MG  --  2.0  --   --   ?PHOS  --  3.4 4.0 4.5  ? ? ?CBG: ?No results for input(s): GLUCAP in the last 168 hours. ? ?Recent Results (from the past 240 hour(s))  ?Resp Panel by RT-PCR (Flu A&B, Covid) Nasopharyngeal Swab     Status: None  ? Collection Time: 03/01/22  9:53 AM  ? Specimen: Nasopharyngeal Swab; Nasopharyngeal(NP) swabs in vial transport medium  ?Result Value Ref Range Status  ? SARS Coronavirus 2 by RT PCR NEGATIVE NEGATIVE Final  ?  Comment: (NOTE) ?SARS-CoV-2 target nucleic acids are NOT DETECTED. ? ?The SARS-CoV-2 RNA is generally detectable in upper respiratory ?specimens during the acute phase of infection. The  lowest ?concentration of SARS-CoV-2 viral copies this assay can detect is ?138 copies/mL. A negative result does not preclude SARS-Cov-2 ?infection and should not be used as the sole basis for treatment or ?othe

## 2022-03-04 NOTE — Procedures (Signed)
? ?  HEMODIALYSIS TREATMENT NOTE: ? ? ?Uneventful 4 hour tx completed using right IJ TDC.  Goal met: 4 liters removed without interruption in UF.  All blood was returned.  Vancomycin given during last hour. ? ? ?Rockwell Alexandria, RN ?

## 2022-03-04 NOTE — Assessment & Plan Note (Signed)
stable °

## 2022-03-04 NOTE — Progress Notes (Signed)
Patient requested pain meds for ongoing pain as prescribed. Given.  ?

## 2022-03-04 NOTE — Assessment & Plan Note (Addendum)
--  resumed home meds with some recent changes made by nephrology service  ?--coreg, hydralazine, clonidine, amlodipine, losartan sent to [facility] at time of d/c ?--pt's ride home to stop at [facility] to pick up meds ?

## 2022-03-04 NOTE — TOC Progression Note (Addendum)
Transition of Care (TOC) - Progression Note  ? ? ?Patient Details  ?Name: Cathy [name] ?MRN: [id number] ?Date of Birth: 1981-01-18 ? ?Transition of Care (TOC) CM/SW Contact  ?Keyshaun Exley D, LCSW ?Phone Number: ?03/04/2022, 10:59 AM ? ?Clinical Narrative:    ?Patient will be residing with her mother at [address] [place], [place] [address]. Spoke with Kieth Brightly at [place] at [employer] and discussed. Webb Silversmith advised that she would clip patient for the [facility] and return contact.  ?Discussed with Kieth Brightly the need to arrange transport. ?Melissa with [employer] confirms that patient receives oxygen through them. She has a home fill system. Patient will need to arrange for [employer] to pick this up before they can deliver it to her new address. Patient needs to call [phone] to have them pick arrange for them to pick the equipment up. Information provided to patient via text message to her cell phone at her request.  ? ? ? ?Expected Discharge Plan: Home/Self Care ?Barriers to Discharge: Continued Medical Work up ? ?Expected Discharge Plan and Services ?Expected Discharge Plan: Home/Self Care ?In-house Referral: Clinical Social Work ?  ?  ?Living arrangements for the past 2 months: No permanent address ?                ?  ?  ?  ?  ?  ?  ?  ?  ?  ?  ? ? ?Social Determinants of Health (SDOH) Interventions ?  ? ?Readmission Risk Interventions ? ?  03/03/2022  ?  1:58 PM  ?Readmission Risk Prevention Plan  ?Transportation Screening Complete  ?Medication Review Press photographer) Complete  ?[place] or Home Care Consult Complete  ?SW Recovery Care/Counseling Consult Complete  ?Palliative Care Screening Not Applicable  ?[place] Not Applicable  ? ? ?

## 2022-03-05 DIAGNOSIS — E039 Hypothyroidism, unspecified: Secondary | ICD-10-CM | POA: Insufficient documentation

## 2022-03-05 DIAGNOSIS — I160 Hypertensive urgency: Secondary | ICD-10-CM | POA: Diagnosis not present

## 2022-03-05 DIAGNOSIS — N186 End stage renal disease: Secondary | ICD-10-CM | POA: Diagnosis not present

## 2022-03-05 DIAGNOSIS — I10 Essential (primary) hypertension: Secondary | ICD-10-CM | POA: Diagnosis not present

## 2022-03-05 DIAGNOSIS — R188 Other ascites: Secondary | ICD-10-CM | POA: Diagnosis not present

## 2022-03-05 LAB — RENAL FUNCTION PANEL
Albumin: 2.7 g/dL — ABNORMAL LOW (ref 3.5–5.0)
Anion gap: 10 (ref 5–15)
BUN: 30 mg/dL — ABNORMAL HIGH (ref 6–20)
CO2: 24 mmol/L (ref 22–32)
Calcium: 8 mg/dL — ABNORMAL LOW (ref 8.9–10.3)
Chloride: 100 mmol/L (ref 98–111)
Creatinine, Ser: 7.23 mg/dL — ABNORMAL HIGH (ref 0.44–1.00)
GFR, Estimated: 7 mL/min — ABNORMAL LOW (ref >60)
Glucose, Bld: 91 mg/dL (ref 70–99)
Phosphorus: 3.2 mg/dL (ref 2.5–4.6)
Potassium: 4.2 mmol/L (ref 3.5–5.1)
Sodium: 134 mmol/L — ABNORMAL LOW (ref 135–145)

## 2022-03-05 MED ORDER — LOSARTAN POTASSIUM 100 MG PO TABS: 100.0000 mg | ORAL_TABLET | Freq: Every day | ORAL | 1 refills | Status: DC

## 2022-03-05 MED ORDER — CLONIDINE HCL 0.2 MG PO TABS: 0.2000 mg | ORAL_TABLET | Freq: Three times a day (TID) | ORAL | 1 refills | Status: DC

## 2022-03-05 MED ORDER — APIXABAN 5 MG PO TABS: 5.0000 mg | ORAL_TABLET | Freq: Two times a day (BID) | ORAL | 1 refills | Status: DC

## 2022-03-05 MED ORDER — SEVELAMER CARBONATE 800 MG PO TABS: 800.0000 mg | ORAL_TABLET | Freq: Three times a day (TID) | ORAL | 1 refills | Status: DC

## 2022-03-05 MED ORDER — VANCOMYCIN HCL 750 MG/150ML IV SOLN
750.0000 mg | INTRAVENOUS | Status: DC
Start: 2022-03-06 — End: 2022-03-21

## 2022-03-05 MED ORDER — HYDRALAZINE HCL 100 MG PO TABS: 100.0000 mg | ORAL_TABLET | Freq: Three times a day (TID) | ORAL | 1 refills | Status: DC

## 2022-03-05 MED ORDER — AMLODIPINE BESYLATE 10 MG PO TABS: 10.0000 mg | ORAL_TABLET | Freq: Every day | ORAL | 1 refills | Status: DC

## 2022-03-05 MED ORDER — CALCIUM ACETATE (PHOS BINDER) 667 MG PO CAPS: 1334.0000 mg | ORAL_CAPSULE | Freq: Three times a day (TID) | ORAL | 1 refills | Status: DC

## 2022-03-05 MED ORDER — CARVEDILOL 25 MG PO TABS
25.0000 mg | ORAL_TABLET | Freq: Two times a day (BID) | ORAL | 1 refills | Status: DC
Start: 2022-03-05 — End: 2023-08-27

## 2022-03-05 MED ORDER — HYDROCODONE-ACETAMINOPHEN 5-325 MG PO TABS: 1.0000 | ORAL_TABLET | Freq: Four times a day (QID) | ORAL | 0 refills | Status: DC | PRN

## 2022-03-05 MED ORDER — OXYCODONE HCL 5 MG PO TABS: 5.0000 mg | ORAL_TABLET | Freq: Four times a day (QID) | ORAL | Status: DC | PRN

## 2022-03-05 NOTE — Progress Notes (Signed)
Patient complained of itching to face, noted irritation to bilateral cheeks. MD Tat made aware. New orders placed. ?

## 2022-03-05 NOTE — Progress Notes (Signed)
Patients BP 168/121. Patient given ordered BP medications and PRN BP medication, see MAR. MD Tat made aware. No new orders. Re-checked blood pressure via vital sign machine was 142/108, checked manually was 140/106. Patient complained of pain, stated she felt like her whole body was on fire. MD Tat made aware. No new orders.  ? ?

## 2022-03-05 NOTE — Discharge Summary (Addendum)
Physician Discharge Summary   Patient: Cathy [name] MRN: [id number] DOB: Aug 19, 1981  Admit date:     03/01/2022  Discharge date: 03/05/22  Discharge Physician: Onalee Hua Reise Hietala   PCP: Pcp, No   Recommendations at discharge:   Please follow up with primary care provider within 1-2 weeks  Please repeat BMP and CBC in one week  [facility] transportation will pick patient up for HD on 4/6 and 4/8 at 9:45 AM to transport patient to HD Give vancomycin 750 mg on HD on 4/6, 4/8, and 4/11     Hospital Course: 41 year old female with ESRD T, Th, Sat, missed HD for past week since 02/22/22, recently discharged from [facility] after a long hospitalization for MRSA bacteremia was supposed to be getting vancomycin with HD thru 03/04/22.  She missed outpatient appt with ID Dr. Elinor Parkinson. She has long history of noncompliance. She is not taking her medications as prescribed.  Her blood pressure was noted to be markedly elevated at her PCP visit and she refused to go to ER. She presented to ED at [facility] today complaining of shortness of breath after missing HD for 1 week.  She also reports abdominal pain and distension that has gotten worse over the last week. Nephrology was planning to arrange outpatient follow up with vascular surgery for eventual AVF placement.  She now has a TDC placed by IR on 3/4 as a replacement for the one that was removed on 3/1 when she was found to have bacteremia.  Her TEE was negative for vegetations on 01/30/22. She tested Covid positive on 01/29/22.  She had an ascites [place] survey on 3/10 and only trace ascites was found and no paracentesis was done.  Today her CT scan is showing a large amount of ascites and she likely will need paracentesis.  Fortunately her potassium was 4.9 today.  Nephrology was consulted and admission was requested for hemodialysis and also paracentesis when available. Pt denies fever and chills, denies chest pain but continues to complain of abdominal pain and distension  and SOB.  CXR shows mild edema.   03/02/2022:  pt had HD on 4/1 and 4.5L was removed. Abdomen remains very distended and painful.  Unable to get paracentesis at this hospital until 4/3.   03/03/2022:  pt feels less SOB now but not back to baseline. Abdominal distension remains and painful. She is agreeable to getting a paracentesis done.     03/04/2022:  Pt reports that she is having gas pain and flatulence. She is still having abdominal discomfort but markedly improved since paracentesis.    03/05/2022:  patient accepted at HD unit in [place].  Her transportation home will stop at [facility] to pick up her anti-HTN meds on the way home.    Assessment and Plan: Ascites --large amount of ascites per CT scan -- paracentesis on 4/3 --HELD APIXABAN FOR PARACENTESIS  --paracentesis completed with 3L fluid removed --restarted apixaban  --cell count and cultures neg for infection  Volume overload --due to noncompliance with diet, medications and hemodialysis treatments.  --consulted to nephrology for hemodialysis;  Pt had HD on 4/1 with 4.5 L removed.  --paracentesis when available (not available at [facility] on weekends) --last HD on 03/04/22--4L removed --next HD at outpatient dialysis center [facility]) 03/06/22 Stable on RA  Tobacco dependence --will offer nicotine patch while in hospital --strongly advised patient to stop all tobacco use  MRSA bacteremia --pt was treated at [facility] and after discharge was supposed to do vancomycin with  HD thru 03/04/22.  Unfortunately she no-showed for her ID appt and she missed last week of HD.  Currently has normal WBC and no fever.  Resume vancomycin with HD but might need to extend treatment for 1 week.   --Pt received vancomycin with HD on 03/01/22.  She was due to complete this on 4/4 however has missed 1 full week of HD treatment. Should extend therapy for 1 additional week.  --vancomycin 750 mg on HD outpt 4/6, 4/8, 4/11  History of pulmonary  embolus --temporarily hold further apixaban today for possible paracentesis on 4/3 --resume after paracentesis --resume apixaban 4/4   Hypertensive urgency --due to not taking blood pressure medication --fortunately the BP improved when given regular home BP meds  --resolved now  Malignant hypertension --when blood pressure meds are taken BP seems to be much better controlled --continue home meds with recent adjustments made by nephrology   HFrEF (heart failure with reduced ejection fraction) (HCC) --pt has referral for cardiology follow up --It appears that she did not show for the 02/13/22 appt --01/30/22 Echo EF 35-40%, global HK  Benign essential HTN --resumed home meds with some recent changes made by nephrology service  --coreg, hydralazine, clonidine, amlodipine, losartan [facility] --pt's [facility] to [facility]s  ESRD on hemodialysis [facility]) --Pt has missed 1 week of dialysis, last reported treatment was 3/25 --consult to nephrology for HD treatment while in hospital --Pt had HD on 4/1 and 4/4 --4/4 HD--4 L removed  Lupus nephritis (HCC) --chronic longstanding problem --nephrology following -continue plaquenil  Pericardial effusion-chronic  --chronic issue related to poor compliance with dialysis --asymptomatic at this time --pt remains hemodynamically stable  Non compliance w medication regimen --longstanding problem that could eventually lead to patient's death if she does not change this behavior, I counseled with her today and she verbalized understanding  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 and patient was instructed, not to drive, operate heavy  machinery, perform activities at heights, swimming or participation in water activities or provide baby-sitting services while on Pain, Sleep and Anxiety Medications; until their outpatient Physician has advised to do so again. Also recommended to not to take more than prescribed Pain, Sleep and Anxiety Medications.  Consultants: renal Procedures performed: HD  Disposition: Home Diet recommendation: renal with fluid restriction Renal diet DISCHARGE MEDICATION: Allergies as of 03/05/2022       Reactions   Oxycodone Itching        Medication List     STOP taking these medications    colchicine 0.6 MG tablet   hydrOXYzine 25 MG tablet Commonly known as: ATARAX   nicotine 21 mg/24hr patch Commonly known as: NICODERM CQ - dosed in mg/24 hours   oxyCODONE 5 MG immediate release tablet Commonly known as: Oxy IR/ROXICODONE       TAKE these medications    acetaminophen 500 MG tablet Commonly known as: TYLENOL Take 1,000 mg by mouth every 6 (six) hours as needed for moderate pain.   albuterol 108 (90 Base) MCG/ACT inhaler Commonly known as: VENTOLIN HFA Inhale 2 puffs into the lungs every 6 (six) hours as needed for wheezing or shortness of breath.  amLODipine 10 MG tablet Commonly known as: NORVASC Take 1 tablet (10 mg total) by mouth daily.   calcium acetate 667 MG capsule Commonly known as: PHOSLO Take 2 capsules (1,334 mg total) by mouth 3 (three) times daily with meals.   carvedilol 25 MG tablet Commonly known as: COREG Take 1 tablet (25 mg total) by mouth 2 (two) times daily with a meal.   cloNIDine 0.2 MG tablet Commonly known as: CATAPRES Take 1 tablet (0.2 mg total) by mouth every 8 (eight) hours. What changed:  medication strength how much to take when to take this   Eliquis 5 MG Tabs tablet Generic drug: apixaban Take 1 tablet (5 mg total) by mouth 2 (two) times daily.   guaiFENesin 600 MG 12 hr tablet Commonly known as: MUCINEX Take 600 mg by  mouth 2 (two) times daily as needed for cough.   hydrALAZINE 100 MG tablet Commonly known as: APRESOLINE Take 1 tablet (100 mg total) by mouth 3 (three) times daily.   HYDROcodone-acetaminophen 5-325 MG tablet Commonly known as: NORCO/VICODIN Take 1-2 tablets by mouth every 6 (six) hours as needed for moderate pain.   hydroxychloroquine 200 MG tablet Commonly known as: PLAQUENIL Take 1/2 tablet (100 mg total) by mouth every other day after hemodialysis   ipratropium 17 MCG/ACT inhaler Commonly known as: ATROVENT HFA Inhale 2 puffs into the lungs every 6 (six) hours as needed for wheezing.   losartan 100 MG tablet Commonly known as: COZAAR Take 1 tablet (100 mg total) by mouth at bedtime. What changed:  medication strength how much to take   Omron 3 Series BP Monitor Devi Use as directed 2 times a day   predniSONE 20 MG tablet Commonly known as: DELTASONE Take 2 tablets (40 mg total) by mouth daily with breakfast.   sevelamer carbonate 800 MG tablet Commonly known as: RENVELA Take 800 mg by mouth 3 (three) times daily.   vancomycin 750 MG/150ML Soln Commonly known as: VANCOREADY Inject 150 mLs (750 mg total) into the vein Every Tuesday,Thursday,and Saturday with dialysis. Last dose on 03/11/22 Start taking on: March 06, 2022 What changed: additional instructions        Discharge Exam: Filed Weights   03/01/22 1749 03/01/22 2000 03/04/22 1440  Weight: 74.9 kg 74.9 kg 70 kg   HEENT:  [place]/AT, No thrush, no icterus CV:  RRR, no rub, no S3, no S4 Lung:  fine bibasilar crackles.  No wheeze Abd:  soft/+BS, +fluid wave; mild distension Ext:  1+ LE edema, no lymphangitis, no synovitis, no rash   Condition at discharge: stable  The results of significant diagnostics from this hospitalization (including imaging, microbiology, ancillary and laboratory) are listed below for reference.   Imaging Studies: CT ABDOMEN PELVIS WO CONTRAST  Result Date: 03/01/2022 CLINICAL  DATA:  41 year old female with acute abdominal pain and distension. Patient with end-stage renal disease on hemodialysis which she recently missed. EXAM: CT ABDOMEN AND PELVIS WITHOUT CONTRAST TECHNIQUE: Multidetector CT imaging of the abdomen and pelvis was performed following the standard protocol without IV contrast. RADIATION DOSE REDUCTION: This exam was performed according to the departmental dose-optimization program which includes automated exposure control, adjustment of the mA and/or kV according to patient size and/or use of iterative reconstruction technique. COMPARISON:  01/07/2022 and prior CTs FINDINGS: Please note that parenchymal and vascular abnormalities may be missed as intravenous contrast was not administered. Lower chest: Small bilateral pleural effusions are again noted. Cardiomegaly is again identified. Slightly decreased peripheral anterior RIGHT  LOWER lobe airspace consolidation noted. No new findings are present. Hepatobiliary: No significant hepatic or gallbladder abnormalities identified. No biliary dilatation identified. Pancreas: Unremarkable Spleen: Unremarkable Adrenals/Urinary Tract: No significant renal, adrenal or bladder abnormalities identified. Stomach/Bowel: Stomach is within normal limits. Appendix appears normal. No evidence of bowel wall thickening, distention, or inflammatory changes. Vascular/Lymphatic: No significant vascular findings are present. No enlarged abdominal or pelvic lymph nodes. Reproductive: Uterus and bilateral adnexa are unremarkable except for bilateral tubal ligation clips. Other: A large amount of ascites is noted in increased from 01/07/2022. Diffuse subcutaneous edema is present. There is no evidence pneumoperitoneum. Musculoskeletal: No acute or suspicious bony abnormalities are noted. IMPRESSION: 1. Large amount of ascites, small bilateral pleural effusions and diffuse subcutaneous edema. 2. Slightly decreased peripheral anterior RIGHT LOWER lobe  airspace consolidation. 3. Cardiomegaly. Electronically Signed   By: Harmon Pier M.D.   On: 03/01/2022 10:45   DG Chest 2 View  Result Date: 03/01/2022 CLINICAL DATA:  Weakness. Shortness of breath and abdominal distension. EXAM: CHEST - 2 VIEW COMPARISON:  01/29/2022 FINDINGS: There is a right chest wall dialysis catheter with tip at the cavoatrial junction. Unchanged cardiac enlargement. Bilateral pleural effusions identified. Mild increase interstitial markings within the lower lung zones concerning for interstitial edema. No airspace consolidation. IMPRESSION: Cardiac enlargement, small pleural effusions and mild edema. Electronically Signed   By: Signa Kell M.D.   On: 03/01/2022 10:35   US Paracentesis  Result Date: 03/03/2022 INDICATION: Ascites EXAM: ULTRASOUND GUIDED DIAGNOSTIC AND THERAPEUTIC PARACENTESIS MEDICATIONS: None. COMPLICATIONS: None immediate. PROCEDURE: Informed written consent was obtained from the patient after a discussion of the risks, benefits and alternatives to treatment. A timeout was performed prior to the initiation of the procedure. Initial ultrasound scanning demonstrates a large amount of ascites within the right lower abdominal quadrant. The right lower abdomen was prepped and draped in the usual sterile fashion. 1% lidocaine was used for local anesthesia. Following this, a 5 [place] Yueh catheter was introduced. An ultrasound image was saved for documentation purposes. The paracentesis was performed. The catheter was removed and a dressing was applied. The patient tolerated the procedure well without immediate post procedural complication. FINDINGS: A total of approximately 3.0 L of amber colored ascitic fluid was removed. Samples were sent to the laboratory as requested by the clinical team. IMPRESSION: Successful ultrasound-guided paracentesis yielding 3.0 liters of peritoneal fluid. Electronically Signed   By: Ulyses Southward M.D.   On: 03/03/2022 12:57   IR ABDOMEN US  LIMITED  Result Date: 02/07/2022 CLINICAL DATA:  Evaluate for ascites and possible paracentesis. EXAM: LIMITED ABDOMEN ULTRASOUND FOR ASCITES TECHNIQUE: Limited ultrasound survey for ascites was performed in all four abdominal quadrants. COMPARISON:  CT 01/07/2022 FINDINGS: Trace abdominal ascites on the right side and left side of the abdomen. IMPRESSION: Trace abdominal ascites.  Paracentesis not performed. Electronically Signed   By: Richarda Overlie M.D.   On: 02/07/2022 15:07    Microbiology: Results for orders placed or performed during the hospital encounter of 03/01/22  Resp Panel by RT-PCR (Flu A&B, Covid) Nasopharyngeal Swab     Status: None   Collection Time: 03/01/22  9:53 AM   Specimen: Nasopharyngeal Swab; Nasopharyngeal(NP) swabs in vial transport medium  Result Value Ref Range Status   SARS Coronavirus 2 by RT PCR NEGATIVE NEGATIVE Final    Comment: (NOTE) SARS-CoV-2 target nucleic acids are NOT DETECTED.  The SARS-CoV-2 RNA is generally detectable in upper respiratory specimens during the acute phase of infection. The lowest  concentration of SARS-CoV-2 viral copies this assay can detect is 138 copies/mL. A negative result does not preclude SARS-Cov-2 infection and should not be used as the sole basis for treatment or other patient management decisions. A negative result may occur with  improper specimen collection/handling, submission of specimen other than nasopharyngeal swab, presence of viral mutation(s) within the areas targeted by this assay, and inadequate number of viral copies(<138 copies/mL). A negative result must be combined with clinical observations, patient history, and epidemiological information. The expected result is Negative.  Fact Sheet for Patients:  BloggerCourse.com  Fact Sheet for Healthcare Providers:  SeriousBroker.it  This test is no t yet approved or cleared by the [place] FDA and  has been  authorized for detection and/or diagnosis of SARS-CoV-2 by FDA under an Emergency Use Authorization (EUA). This EUA will remain  in effect (meaning this test can be used) for the duration of the COVID-19 declaration under Section 564(b)(1) of the Act, 21 U.S.C.section 360bbb-3(b)(1), unless the authorization is terminated  or revoked sooner.       Influenza A by PCR NEGATIVE NEGATIVE Final   Influenza B by PCR NEGATIVE NEGATIVE Final    Comment: (NOTE) The Xpert Xpress SARS-CoV-2/FLU/RSV plus assay is intended as an aid in the diagnosis of influenza from Nasopharyngeal swab specimens and should not be used as a sole basis for treatment. Nasal washings and aspirates are unacceptable for Xpert Xpress SARS-CoV-2/FLU/RSV testing.  Fact Sheet for Patients: BloggerCourse.com  Fact Sheet for Healthcare Providers: SeriousBroker.it  This test is not yet approved or cleared by the [place] FDA and has been authorized for detection and/or diagnosis of SARS-CoV-2 by FDA under an Emergency Use Authorization (EUA). This EUA will remain in effect (meaning this test can be used) for the duration of the COVID-19 declaration under Section 564(b)(1) of the Act, 21 U.S.C. section 360bbb-3(b)(1), unless the authorization is terminated or revoked.  Performed at [facility], [address], [place], [place] [address]   Blood culture (routine x 2)     Status: None (Preliminary result)   Collection Time: 03/01/22 10:40 AM   Specimen: Right Antecubital; Blood  Result Value Ref Range Status   Specimen Description   Final    RIGHT ANTECUBITAL BOTTLES DRAWN AEROBIC AND ANAEROBIC   Special Requests Blood Culture adequate volume  Final   Culture   Final    NO GROWTH 4 DAYS Performed at [facility], [address] [place], [place] [address]    Report Status PENDING  Incomplete  Blood culture (routine x 2)     Status: None (Preliminary result)    Collection Time: 03/01/22 10:40 AM   Specimen: Left Antecubital; Blood  Result Value Ref Range Status   Specimen Description   Final    LEFT ANTECUBITAL BOTTLES DRAWN AEROBIC AND ANAEROBIC   Special Requests   Final    Blood Culture results may not be optimal due to an excessive volume of blood received in culture bottles   Culture   Final    NO GROWTH 4 DAYS Performed at [facility], [address], [place], [place] [address]    Report Status PENDING  Incomplete  Gram stain     Status: None   Collection Time: 03/03/22 12:20 PM   Specimen: Peritoneal Washings  Result Value Ref Range Status   Specimen Description PERITONEAL ASCITIC  Final   Special Requests NONE  Final   Gram Stain   Final    NO ORGANISMS SEEN WBC  PRESENT, PREDOMINANTLY MONONUCLEAR CYTOSPIN SMEAR Performed at [facility], [address], [place], [place] [address]    Report Status 03/03/2022 FINAL  Final  Culture, body fluid w Gram Stain-bottle     Status: None (Preliminary result)   Collection Time: 03/03/22 12:20 PM   Specimen: Peritoneal Washings  Result Value Ref Range Status   Specimen Description PERITONEAL ASCITIC  Final   Special Requests   Final    BOTTLES DRAWN AEROBIC AND ANAEROBIC Blood Culture adequate volume   Culture   Final    NO GROWTH 2 DAYS Performed at [facility], [address] [place], [place] [address]    Report Status PENDING  Incomplete  MRSA Next Gen by PCR, Nasal     Status: None   Collection Time: 03/03/22  5:16 PM   Specimen: Nasal Mucosa; Nasal Swab  Result Value Ref Range Status   MRSA by PCR Next Gen NOT DETECTED NOT DETECTED Final    Comment: (NOTE) The GeneXpert MRSA Assay (FDA approved for NASAL specimens only), is one component of a comprehensive MRSA colonization surveillance program. It is not intended to diagnose MRSA infection nor to guide or monitor treatment for MRSA infections. Test performance is not FDA approved in patients less than 39 years old. Performed  at [facility], [address], [place], [place] [address]     Labs: CBC: Recent Labs  Lab 03/01/22 1040 03/04/22 1523  WBC 7.9 6.3  NEUTROABS 5.3  --   HGB 12.0 10.7*  HCT 39.8 35.5*  MCV 92.1 92.2  PLT 272 250   Basic Metabolic Panel: Recent Labs  Lab 03/01/22 1040 03/02/22 0508 03/03/22 0448 03/04/22 0612 03/05/22 0524  NA 136 135 135 133* 134*  K 4.9 3.6 4.1 4.7 4.2  CL 96* 97* 95* 95* 100  CO2 22 25 26 24 24   GLUCOSE 90 78 120* 122* 91  BUN 67* 30* 41* 54* 30*  CREATININE 14.32* 8.53* 10.06* 11.09* 7.23*  CALCIUM 8.8* 7.9* 8.2* 7.9* 8.0*  MG  --  2.0  --   --   --   PHOS  --  3.4 4.0 4.5 3.2   Liver Function Tests: Recent Labs  Lab 03/01/22 1040 03/02/22 0508 03/03/22 0448 03/04/22 0612 03/05/22 0524  AST 22  --   --   --   --   ALT 12  --   --   --   --   ALKPHOS 288*  --   --   --   --   BILITOT 0.9  --   --   --   --   PROT 7.0  --   --   --   --   ALBUMIN 3.3* 2.7* 2.9* 2.8* 2.7*   CBG: No results for input(s): GLUCAP in the last 168 hours.  Discharge time spent: greater than 30 minutes.  Signed: Catarina Hartshorn, MD [facility] 03/05/2022

## 2022-03-05 NOTE — Progress Notes (Signed)
Patient discharged home today, transported home by family. Discharge paperwork went over with patient, patient verbalized understanding. Belongings sent home with patient. Patient stated she needed calcium acetate, prednisone and sevelamer carbonate sent to pharmacy due to not having at home. MD Tat made aware. Per MD will send to pharmacy.  ?

## 2022-03-05 NOTE — Progress Notes (Signed)
Patient ID: [name], female   DOB: 1981-02-05, 41 y.o.   MRN: [id number] ?S: Complaining of abdominal and hip pain. ?O:BP (!) 142/103   Pulse 62   Temp 99.1 ?F (37.3 ?C) (Oral)   Resp 19   Ht 5\' 7"  (1.702 m)   Wt 70 kg   SpO2 99%   BMI 24.17 kg/m?  ? ?Intake/Output Summary (Last 24 hours) at 03/05/2022 1046 ?Last data filed at 03/04/2022 1850 ?Gross per 24 hour  ?Intake 241 ml  ?Output 4000 ml  ?Net -3759 ml  ? ?Intake/Output: ?I/O last 3 completed shifts: ?In: 010 [P.O.:721] ?Out: 4000 [Other:4000] ? Intake/Output this shift: ? No intake/output data recorded. ?Weight change:  ?XNA:TFTDDUKGU but arouseable ?CVS:RRR ?Resp: CTA  ?RKY:HCWCBJSEG, +BS, soft, mildly tender to palpation, no rebound, + fluid wave ?Ext:1+ edema BLE ? ?Recent Labs  ?Lab 03/01/22 ?1040 03/02/22 ?3151 03/03/22 ?0448 03/04/22 ?0612 03/05/22 ?7616  ?NA 136 135 135 133* 134*  ?K 4.9 3.6 4.1 4.7 4.2  ?CL 96* 97* 95* 95* 100  ?CO2 22 25 26 24 24   ?GLUCOSE 90 78 120* 122* 91  ?BUN 67* 30* 41* 54* 30*  ?CREATININE 14.32* 8.53* 10.06* 11.09* 7.23*  ?ALBUMIN 3.3* 2.7* 2.9* 2.8* 2.7*  ?CALCIUM 8.8* 7.9* 8.2* 7.9* 8.0*  ?PHOS  --  3.4 4.0 4.5 3.2  ?AST 22  --   --   --   --   ?ALT 12  --   --   --   --   ? ?Liver Function Tests: ?Recent Labs  ?Lab 03/01/22 ?1040 03/02/22 ?0737 03/03/22 ?0448 03/04/22 ?0612 03/05/22 ?1062  ?AST 22  --   --   --   --   ?ALT 12  --   --   --   --   ?ALKPHOS 288*  --   --   --   --   ?BILITOT 0.9  --   --   --   --   ?PROT 7.0  --   --   --   --   ?ALBUMIN 3.3*   < > 2.9* 2.8* 2.7*  ? < > = values in this interval not displayed.  ? ?Recent Labs  ?Lab 03/01/22 ?1040  ?LIPASE 28  ? ?No results for input(s): AMMONIA in the last 168 hours. ?CBC: ?Recent Labs  ?Lab 03/01/22 ?1040 03/04/22 ?1523  ?WBC 7.9 6.3  ?NEUTROABS 5.3  --   ?HGB 12.0 10.7*  ?HCT 39.8 35.5*  ?MCV 92.1 92.2  ?PLT 272 250  ? ?Cardiac Enzymes: ?No results for input(s): CKTOTAL, CKMB, CKMBINDEX, TROPONINI in the last 168 hours. ?CBG: ?No results for  input(s): GLUCAP in the last 168 hours. ? ?Iron Studies: No results for input(s): IRON, TIBC, TRANSFERRIN, FERRITIN in the last 72 hours. ?Studies/Results: ?US Paracentesis ? ?Result Date: 03/03/2022 ?INDICATION: Ascites EXAM: ULTRASOUND GUIDED DIAGNOSTIC AND THERAPEUTIC PARACENTESIS MEDICATIONS: None. COMPLICATIONS: None immediate. PROCEDURE: Informed written consent was obtained from the patient after a discussion of the risks, benefits and alternatives to treatment. A timeout was performed prior to the initiation of the procedure. Initial ultrasound scanning demonstrates a large amount of ascites within the right lower abdominal quadrant. The right lower abdomen was prepped and draped in the usual sterile fashion. 1% lidocaine was used for local anesthesia. Following this, a 5 [place] Yueh catheter was introduced. An ultrasound image was saved for documentation purposes. The paracentesis was performed. The catheter was removed and a dressing was applied. The patient tolerated the procedure  well without immediate post procedural complication. FINDINGS: A total of approximately 3.0 L of amber colored ascitic fluid was removed. Samples were sent to the laboratory as requested by the clinical team. IMPRESSION: Successful ultrasound-guided paracentesis yielding 3.0 liters of peritoneal fluid. Electronically Signed   By: Lavonia Dana M.D.   On: 03/03/2022 12:57   ? amLODipine  10 mg Oral Daily  ? apixaban  5 mg Oral BID  ? calcium acetate  1,334 mg Oral TID WC  ? carvedilol  25 mg Oral BID WC  ? Chlorhexidine Gluconate Cloth  6 each Topical Q0600  ? Chlorhexidine Gluconate Cloth  6 each Topical Q0600  ? cloNIDine  0.2 mg Oral Q8H  ? hydroxychloroquine  100 mg Oral Q48H  ? losartan  100 mg Oral QHS  ? nicotine  21 mg Transdermal Daily  ? predniSONE  20 mg Oral Q breakfast  ? sevelamer carbonate  800 mg Oral TID WC  ? ? ?BMET ?   ?Component Value Date/Time  ? NA 134 (L) 03/05/2022 0524  ? NA 136 12/10/2018 1142  ? K 4.2  03/05/2022 0524  ? CL 100 03/05/2022 0524  ? CO2 24 03/05/2022 0524  ? GLUCOSE 91 03/05/2022 0524  ? BUN 30 (H) 03/05/2022 0524  ? BUN 9 12/10/2018 1142  ? CREATININE 7.23 (H) 03/05/2022 0524  ? CALCIUM 8.0 (L) 03/05/2022 0524  ? GFRNONAA 7 (L) 03/05/2022 0524  ? GFRAA >60 08/07/2020 1209  ? ?CBC ?   ?Component Value Date/Time  ? WBC 6.3 03/04/2022 1523  ? RBC 3.85 (L) 03/04/2022 1523  ? HGB 10.7 (L) 03/04/2022 1523  ? HGB 6.6 (L) 12/04/2020 1158  ? HCT 35.5 (L) 03/04/2022 1523  ? HCT 21.0 (L) 01/12/2019 0904  ? PLT 250 03/04/2022 1523  ? PLT 228 01/12/2019 0904  ? MCV 92.2 03/04/2022 1523  ? MCV 92 01/12/2019 0904  ? MCH 27.8 03/04/2022 1523  ? MCHC 30.1 03/04/2022 1523  ? RDW 19.9 (H) 03/04/2022 1523  ? RDW 13.9 01/12/2019 0904  ? LYMPHSABS 1.7 03/01/2022 1040  ? LYMPHSABS 0.7 10/04/2018 1131  ? MONOABS 0.7 03/01/2022 1040  ? EOSABS 0.1 03/01/2022 1040  ? EOSABS 0.0 10/04/2018 1131  ? BASOSABS 0.1 03/01/2022 1040  ? BASOSABS 0.0 10/04/2018 1131  ? ?HPI:  41 year old BF-  severe HTN and ESRD as well as medical non compliance and recent MRSA bacteremia presents after a week of no HD and volume overload  ? ?Dialyzes at [place] 4 hours   EDW 52. ?HD [place] 2/2, Dialyzer 180, Heparin only in cath. Access TDC.  mircera 200 q 2 weeks, hect 4 q tx and vanc 750 q tx til 4/4 ?On treatment 3/25 had BP of over 200 pre and post-  left out at 73.7 kg  ? ?Assessment/Plan: ? ?Volume overload with ascites - missed HD for a week.  S/p paracentesis on 03/03/22 of 3 liters.  HD on 03/01/22 UF 4.5 liters, HD on 03/03/22 UF of 4 liters.  Plan to continue to UF as tolerated. ?ESRD - continue with TTS schedule and UF as tolerated.  Has been noncompliant in the past and hopefully she will improve living with her mother. ?HTN - stable on current regimen. ?Anemia of ESRD - stable, no ESA for now. 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 ?MRSA bacteremia - s/p line change and still on IV vancomycin.  Blood cultures negative to  date. ?Vascular access - will eventually need  AVF/AVG once bacteremia has been adequately treated and no evidence of recurrence.  ?Ascites - due to noncompliance with HD.  S/p paracentesis of 3 liters on 03/03/22.  Gram stain negative and cultures negative to date. ?Disposition - once stable for discharge will transfer to [facility] in [place]. ? ?Donetta Potts, MD ?[facility] ? ? ?

## 2022-03-05 NOTE — TOC Transition Note (Signed)
Transition of Care (TOC) - CM/SW Discharge Note ? ? ?Patient Details  ?Name: Cathy [name] ?MRN: [id number] ?Date of Birth: 09/15/1981 ? ?Transition of Care (TOC) CM/SW Contact:  ?Nico Rogness D, LCSW ?Phone Number: ?03/05/2022, 4:36 PM ? ? ?Clinical Narrative:    ?Patient to d/c home [facility] transportation scheduled to take patient to HD tomorrow and Saturday. HD clinic will arrange ongoing transportation. Patient has to be at HD at 10:45. [facility] will pick her up at 9:45-10 on tomorrow and Saturday. Patient made aware. ?Penny at HD informed of Vanc need through 4/11. D/c summary faxed to [facility].  ?TOC signing off.  ? ? ?Final next level of care: Home/Self Care ?Barriers to Discharge: No Barriers Identified ? ? ?Patient Goals and CMS Choice ?Patient states their goals for this hospitalization and ongoing recovery are:: transfer to [facility] ?  ?Choice offered to / list presented to : Patient ? ?Discharge Placement ?  ?           ?  ?  ?  ?  ? ?Discharge Plan and Services ?In-house Referral: Clinical Social Work ?  ?           ?  ?  ?  ?  ?  ?  ?  ?  ?  ?  ? ?Social Determinants of Health (SDOH) Interventions ?  ? ? ?Readmission Risk Interventions ? ?  03/03/2022  ?  1:58 PM  ?Readmission Risk Prevention Plan  ?Transportation Screening Complete  ?Medication Review Press photographer) Complete  ?[place] or Home Care Consult Complete  ?SW Recovery Care/Counseling Consult Complete  ?Palliative Care Screening Not Applicable  ?[place] Not Applicable  ? ? ? ? ? ?

## 2022-03-06 DIAGNOSIS — Z992 Dependence on renal dialysis: Secondary | ICD-10-CM | POA: Diagnosis not present

## 2022-03-06 DIAGNOSIS — R7881 Bacteremia: Secondary | ICD-10-CM | POA: Diagnosis not present

## 2022-03-06 DIAGNOSIS — N186 End stage renal disease: Secondary | ICD-10-CM | POA: Diagnosis not present

## 2022-03-06 DIAGNOSIS — L299 Pruritus, unspecified: Secondary | ICD-10-CM | POA: Diagnosis not present

## 2022-03-06 DIAGNOSIS — D689 Coagulation defect, unspecified: Secondary | ICD-10-CM | POA: Diagnosis not present

## 2022-03-06 DIAGNOSIS — T782XXA Anaphylactic shock, unspecified, initial encounter: Secondary | ICD-10-CM | POA: Diagnosis not present

## 2022-03-06 DIAGNOSIS — N2581 Secondary hyperparathyroidism of renal origin: Secondary | ICD-10-CM | POA: Diagnosis not present

## 2022-03-06 LAB — CULTURE, BLOOD (ROUTINE X 2)
Culture: NO GROWTH
Culture: NO GROWTH
Special Requests: ADEQUATE

## 2022-03-08 DIAGNOSIS — Z992 Dependence on renal dialysis: Secondary | ICD-10-CM | POA: Diagnosis not present

## 2022-03-08 DIAGNOSIS — D689 Coagulation defect, unspecified: Secondary | ICD-10-CM | POA: Diagnosis not present

## 2022-03-08 DIAGNOSIS — L299 Pruritus, unspecified: Secondary | ICD-10-CM | POA: Diagnosis not present

## 2022-03-08 DIAGNOSIS — N2581 Secondary hyperparathyroidism of renal origin: Secondary | ICD-10-CM | POA: Diagnosis not present

## 2022-03-08 DIAGNOSIS — N186 End stage renal disease: Secondary | ICD-10-CM | POA: Diagnosis not present

## 2022-03-08 DIAGNOSIS — R7881 Bacteremia: Secondary | ICD-10-CM | POA: Diagnosis not present

## 2022-03-08 DIAGNOSIS — T782XXA Anaphylactic shock, unspecified, initial encounter: Secondary | ICD-10-CM | POA: Diagnosis not present

## 2022-03-08 LAB — CULTURE, BODY FLUID W GRAM STAIN -BOTTLE
Culture: NO GROWTH
Special Requests: ADEQUATE

## 2022-03-11 DIAGNOSIS — T782XXA Anaphylactic shock, unspecified, initial encounter: Secondary | ICD-10-CM | POA: Diagnosis not present

## 2022-03-11 DIAGNOSIS — N2581 Secondary hyperparathyroidism of renal origin: Secondary | ICD-10-CM | POA: Diagnosis not present

## 2022-03-11 DIAGNOSIS — N186 End stage renal disease: Secondary | ICD-10-CM | POA: Diagnosis not present

## 2022-03-11 DIAGNOSIS — D689 Coagulation defect, unspecified: Secondary | ICD-10-CM | POA: Diagnosis not present

## 2022-03-11 DIAGNOSIS — L299 Pruritus, unspecified: Secondary | ICD-10-CM | POA: Diagnosis not present

## 2022-03-11 DIAGNOSIS — Z992 Dependence on renal dialysis: Secondary | ICD-10-CM | POA: Diagnosis not present

## 2022-03-11 DIAGNOSIS — R7881 Bacteremia: Secondary | ICD-10-CM | POA: Diagnosis not present

## 2022-03-13 DIAGNOSIS — Z992 Dependence on renal dialysis: Secondary | ICD-10-CM | POA: Diagnosis not present

## 2022-03-13 DIAGNOSIS — R7881 Bacteremia: Secondary | ICD-10-CM | POA: Diagnosis not present

## 2022-03-13 DIAGNOSIS — N186 End stage renal disease: Secondary | ICD-10-CM | POA: Diagnosis not present

## 2022-03-13 DIAGNOSIS — D689 Coagulation defect, unspecified: Secondary | ICD-10-CM | POA: Diagnosis not present

## 2022-03-13 DIAGNOSIS — T782XXA Anaphylactic shock, unspecified, initial encounter: Secondary | ICD-10-CM | POA: Diagnosis not present

## 2022-03-13 DIAGNOSIS — N2581 Secondary hyperparathyroidism of renal origin: Secondary | ICD-10-CM | POA: Diagnosis not present

## 2022-03-13 DIAGNOSIS — L299 Pruritus, unspecified: Secondary | ICD-10-CM | POA: Diagnosis not present

## 2022-03-13 LAB — TOTAL BILIRUBIN, BODY FLUID: Total bilirubin, fluid: 0.2 mg/dL

## 2022-03-15 DIAGNOSIS — N2581 Secondary hyperparathyroidism of renal origin: Secondary | ICD-10-CM | POA: Diagnosis not present

## 2022-03-15 DIAGNOSIS — L299 Pruritus, unspecified: Secondary | ICD-10-CM | POA: Diagnosis not present

## 2022-03-15 DIAGNOSIS — R7881 Bacteremia: Secondary | ICD-10-CM | POA: Diagnosis not present

## 2022-03-15 DIAGNOSIS — Z992 Dependence on renal dialysis: Secondary | ICD-10-CM | POA: Diagnosis not present

## 2022-03-15 DIAGNOSIS — D689 Coagulation defect, unspecified: Secondary | ICD-10-CM | POA: Diagnosis not present

## 2022-03-15 DIAGNOSIS — N186 End stage renal disease: Secondary | ICD-10-CM | POA: Diagnosis not present

## 2022-03-15 DIAGNOSIS — T782XXA Anaphylactic shock, unspecified, initial encounter: Secondary | ICD-10-CM | POA: Diagnosis not present

## 2022-03-17 ENCOUNTER — Encounter (HOSPITAL_COMMUNITY): Payer: Self-pay | Admitting: *Deleted

## 2022-03-17 ENCOUNTER — Emergency Department (HOSPITAL_COMMUNITY): Payer: Medicare Other

## 2022-03-17 ENCOUNTER — Inpatient Hospital Stay (HOSPITAL_COMMUNITY)
Admission: EM | Admit: 2022-03-17 | Discharge: 2022-03-21 | DRG: 193 | Disposition: A | Discharge status: Home / Self Care | Payer: Medicare Other | Attending: Internal Medicine | Admitting: Internal Medicine

## 2022-03-17 ENCOUNTER — Other Ambulatory Visit: Payer: Self-pay

## 2022-03-17 DIAGNOSIS — N250 Renal osteodystrophy: Secondary | ICD-10-CM | POA: Diagnosis not present

## 2022-03-17 DIAGNOSIS — M898X9 Other specified disorders of bone, unspecified site: Secondary | ICD-10-CM | POA: Diagnosis not present

## 2022-03-17 DIAGNOSIS — Z8614 Personal history of Methicillin resistant Staphylococcus aureus infection: Secondary | ICD-10-CM

## 2022-03-17 DIAGNOSIS — R1084 Generalized abdominal pain: Secondary | ICD-10-CM

## 2022-03-17 DIAGNOSIS — Z86711 Personal history of pulmonary embolism: Secondary | ICD-10-CM

## 2022-03-17 DIAGNOSIS — R110 Nausea: Secondary | ICD-10-CM | POA: Diagnosis not present

## 2022-03-17 DIAGNOSIS — N186 End stage renal disease: Secondary | ICD-10-CM

## 2022-03-17 DIAGNOSIS — M3214 Glomerular disease in systemic lupus erythematosus: Secondary | ICD-10-CM | POA: Diagnosis not present

## 2022-03-17 DIAGNOSIS — I132 Hypertensive heart and chronic kidney disease with heart failure and with stage 5 chronic kidney disease, or end stage renal disease: Secondary | ICD-10-CM | POA: Diagnosis not present

## 2022-03-17 DIAGNOSIS — J189 Pneumonia, unspecified organism: Principal | ICD-10-CM

## 2022-03-17 DIAGNOSIS — R188 Other ascites: Secondary | ICD-10-CM | POA: Diagnosis not present

## 2022-03-17 DIAGNOSIS — R0602 Shortness of breath: Secondary | ICD-10-CM | POA: Diagnosis not present

## 2022-03-17 DIAGNOSIS — I5042 Chronic combined systolic (congestive) and diastolic (congestive) heart failure: Secondary | ICD-10-CM | POA: Diagnosis present

## 2022-03-17 DIAGNOSIS — M3212 Pericarditis in systemic lupus erythematosus: Secondary | ICD-10-CM | POA: Diagnosis present

## 2022-03-17 DIAGNOSIS — F339 Major depressive disorder, recurrent, unspecified: Secondary | ICD-10-CM | POA: Diagnosis not present

## 2022-03-17 DIAGNOSIS — R109 Unspecified abdominal pain: Principal | ICD-10-CM | POA: Diagnosis present

## 2022-03-17 DIAGNOSIS — N2581 Secondary hyperparathyroidism of renal origin: Secondary | ICD-10-CM | POA: Diagnosis present

## 2022-03-17 DIAGNOSIS — L299 Pruritus, unspecified: Secondary | ICD-10-CM | POA: Diagnosis not present

## 2022-03-17 DIAGNOSIS — K5900 Constipation, unspecified: Secondary | ICD-10-CM | POA: Diagnosis not present

## 2022-03-17 DIAGNOSIS — I5022 Chronic systolic (congestive) heart failure: Secondary | ICD-10-CM | POA: Diagnosis present

## 2022-03-17 DIAGNOSIS — Z7901 Long term (current) use of anticoagulants: Secondary | ICD-10-CM

## 2022-03-17 DIAGNOSIS — J45909 Unspecified asthma, uncomplicated: Secondary | ICD-10-CM | POA: Diagnosis not present

## 2022-03-17 DIAGNOSIS — I120 Hypertensive chronic kidney disease with stage 5 chronic kidney disease or end stage renal disease: Secondary | ICD-10-CM | POA: Diagnosis not present

## 2022-03-17 DIAGNOSIS — I517 Cardiomegaly: Secondary | ICD-10-CM | POA: Diagnosis not present

## 2022-03-17 DIAGNOSIS — D631 Anemia in chronic kidney disease: Secondary | ICD-10-CM | POA: Diagnosis not present

## 2022-03-17 DIAGNOSIS — I10 Essential (primary) hypertension: Secondary | ICD-10-CM | POA: Diagnosis not present

## 2022-03-17 DIAGNOSIS — M329 Systemic lupus erythematosus, unspecified: Secondary | ICD-10-CM

## 2022-03-17 DIAGNOSIS — Z91128 Patient's intentional underdosing of medication regimen for other reason: Secondary | ICD-10-CM

## 2022-03-17 DIAGNOSIS — Z79899 Other long term (current) drug therapy: Secondary | ICD-10-CM

## 2022-03-17 DIAGNOSIS — F17210 Nicotine dependence, cigarettes, uncomplicated: Secondary | ICD-10-CM | POA: Diagnosis present

## 2022-03-17 DIAGNOSIS — Z8249 Family history of ischemic heart disease and other diseases of the circulatory system: Secondary | ICD-10-CM

## 2022-03-17 DIAGNOSIS — Z8616 Personal history of COVID-19: Secondary | ICD-10-CM | POA: Diagnosis not present

## 2022-03-17 DIAGNOSIS — R52 Pain, unspecified: Secondary | ICD-10-CM | POA: Diagnosis not present

## 2022-03-17 DIAGNOSIS — Z91148 Patient's other noncompliance with medication regimen for other reason: Secondary | ICD-10-CM

## 2022-03-17 DIAGNOSIS — Z992 Dependence on renal dialysis: Secondary | ICD-10-CM

## 2022-03-17 DIAGNOSIS — I5043 Acute on chronic combined systolic (congestive) and diastolic (congestive) heart failure: Secondary | ICD-10-CM | POA: Diagnosis present

## 2022-03-17 DIAGNOSIS — Z833 Family history of diabetes mellitus: Secondary | ICD-10-CM

## 2022-03-17 DIAGNOSIS — T378X6A Underdosing of other specified systemic anti-infectives and antiparasitics, initial encounter: Secondary | ICD-10-CM | POA: Diagnosis present

## 2022-03-17 DIAGNOSIS — I2699 Other pulmonary embolism without acute cor pulmonale: Secondary | ICD-10-CM | POA: Diagnosis not present

## 2022-03-17 DIAGNOSIS — J90 Pleural effusion, not elsewhere classified: Secondary | ICD-10-CM | POA: Diagnosis not present

## 2022-03-17 DIAGNOSIS — K219 Gastro-esophageal reflux disease without esophagitis: Secondary | ICD-10-CM | POA: Diagnosis present

## 2022-03-17 DIAGNOSIS — M545 Low back pain, unspecified: Secondary | ICD-10-CM | POA: Diagnosis not present

## 2022-03-17 DIAGNOSIS — F329 Major depressive disorder, single episode, unspecified: Secondary | ICD-10-CM | POA: Diagnosis present

## 2022-03-17 DIAGNOSIS — R079 Chest pain, unspecified: Secondary | ICD-10-CM | POA: Diagnosis not present

## 2022-03-17 DIAGNOSIS — T380X6A Underdosing of glucocorticoids and synthetic analogues, initial encounter: Secondary | ICD-10-CM | POA: Diagnosis not present

## 2022-03-17 DIAGNOSIS — Z885 Allergy status to narcotic agent status: Secondary | ICD-10-CM

## 2022-03-17 DIAGNOSIS — J9811 Atelectasis: Secondary | ICD-10-CM | POA: Diagnosis not present

## 2022-03-17 LAB — CBC WITH DIFFERENTIAL/PLATELET
Abs Immature Granulocytes: 0.04 10*3/uL (ref 0.00–0.07)
Basophils Absolute: 0 10*3/uL (ref 0.0–0.1)
Basophils Relative: 1 %
Eosinophils Absolute: 0.1 10*3/uL (ref 0.0–0.5)
Eosinophils Relative: 2 %
HCT: 35.2 % — ABNORMAL LOW (ref 36.0–46.0)
Hemoglobin: 11 g/dL — ABNORMAL LOW (ref 12.0–15.0)
Immature Granulocytes: 1 %
Lymphocytes Relative: 16 %
Lymphs Abs: 0.9 10*3/uL (ref 0.7–4.0)
MCH: 28.1 pg (ref 26.0–34.0)
MCHC: 31.3 g/dL (ref 30.0–36.0)
MCV: 89.8 fL (ref 80.0–100.0)
Monocytes Absolute: 0.8 10*3/uL (ref 0.1–1.0)
Monocytes Relative: 14 %
Neutro Abs: 4 10*3/uL (ref 1.7–7.7)
Neutrophils Relative %: 66 %
Platelets: 216 10*3/uL (ref 150–400)
RBC: 3.92 MIL/uL (ref 3.87–5.11)
RDW: 18.4 % — ABNORMAL HIGH (ref 11.5–15.5)
WBC: 5.9 10*3/uL (ref 4.0–10.5)
nRBC: 0 % (ref 0.0–0.2)

## 2022-03-17 LAB — COMPREHENSIVE METABOLIC PANEL
ALT: 18 U/L (ref 0–44)
AST: 29 U/L (ref 15–41)
Albumin: 3.2 g/dL — ABNORMAL LOW (ref 3.5–5.0)
Alkaline Phosphatase: 278 U/L — ABNORMAL HIGH (ref 38–126)
Anion gap: 13 (ref 5–15)
BUN: 32 mg/dL — ABNORMAL HIGH (ref 6–20)
CO2: 26 mmol/L (ref 22–32)
Calcium: 8.8 mg/dL — ABNORMAL LOW (ref 8.9–10.3)
Chloride: 96 mmol/L — ABNORMAL LOW (ref 98–111)
Creatinine, Ser: 7.49 mg/dL — ABNORMAL HIGH (ref 0.44–1.00)
GFR, Estimated: 7 mL/min — ABNORMAL LOW (ref >60)
Glucose, Bld: 83 mg/dL (ref 70–99)
Potassium: 3.9 mmol/L (ref 3.5–5.1)
Sodium: 135 mmol/L (ref 135–145)
Total Bilirubin: 0.8 mg/dL (ref 0.3–1.2)
Total Protein: 6.9 g/dL (ref 6.5–8.1)

## 2022-03-17 LAB — TROPONIN I (HIGH SENSITIVITY)
Troponin I (High Sensitivity): 62 ng/L — ABNORMAL HIGH (ref <18)
Troponin I (High Sensitivity): 62 ng/L — ABNORMAL HIGH (ref <18)

## 2022-03-17 LAB — LIPASE, BLOOD: Lipase: 59 U/L — ABNORMAL HIGH (ref 11–51)

## 2022-03-17 LAB — PROCALCITONIN: Procalcitonin: 0.97 ng/mL

## 2022-03-17 LAB — SEDIMENTATION RATE: Sed Rate: 38 mm/hr — ABNORMAL HIGH (ref 0–22)

## 2022-03-17 LAB — C-REACTIVE PROTEIN: CRP: 2.4 mg/dL — ABNORMAL HIGH (ref <1.0)

## 2022-03-17 MED ORDER — HYDRALAZINE HCL 25 MG PO TABS
100.0000 mg | ORAL_TABLET | Freq: Once | ORAL | Status: AC
  Administered 2022-03-17: 100 mg via ORAL
  Filled 2022-03-17: qty 4

## 2022-03-17 MED ORDER — CARVEDILOL 12.5 MG PO TABS
25.0000 mg | ORAL_TABLET | Freq: Two times a day (BID) | ORAL | Status: DC
  Administered 2022-03-17 – 2022-03-21 (×5): 25 mg via ORAL
  Filled 2022-03-17 (×6): qty 2

## 2022-03-17 MED ORDER — SODIUM CHLORIDE 0.9 % IV SOLN
2.0000 g | INTRAVENOUS | Status: DC
  Administered 2022-03-18 – 2022-03-20 (×4): 2 g via INTRAVENOUS
  Filled 2022-03-17 (×4): qty 20

## 2022-03-17 MED ORDER — POLYETHYLENE GLYCOL 3350 17 G PO PACK
17.0000 g | PACK | Freq: Two times a day (BID) | ORAL | Status: AC
  Filled 2022-03-17 (×4): qty 1

## 2022-03-17 MED ORDER — AMLODIPINE BESYLATE 5 MG PO TABS
10.0000 mg | ORAL_TABLET | Freq: Once | ORAL | Status: AC
  Administered 2022-03-17: 10 mg via ORAL
  Filled 2022-03-17: qty 2

## 2022-03-17 MED ORDER — HYDROMORPHONE HCL 1 MG/ML IJ SOLN
0.5000 mg | Freq: Once | INTRAMUSCULAR | Status: AC
  Administered 2022-03-17: 0.5 mg via INTRAVENOUS
  Filled 2022-03-17: qty 1

## 2022-03-17 MED ORDER — HYDRALAZINE HCL 25 MG PO TABS
100.0000 mg | ORAL_TABLET | Freq: Three times a day (TID) | ORAL | Status: DC
  Administered 2022-03-18 – 2022-03-21 (×10): 100 mg via ORAL
  Filled 2022-03-17 (×11): qty 4

## 2022-03-17 MED ORDER — ONDANSETRON HCL 4 MG/2ML IJ SOLN
4.0000 mg | Freq: Four times a day (QID) | INTRAMUSCULAR | Status: DC | PRN
  Administered 2022-03-18: 4 mg via INTRAVENOUS
  Filled 2022-03-17 (×2): qty 2

## 2022-03-17 MED ORDER — ACETAMINOPHEN 650 MG RE SUPP: 650.0000 mg | Freq: Four times a day (QID) | RECTAL | Status: DC | PRN

## 2022-03-17 MED ORDER — ONDANSETRON HCL 4 MG PO TABS: 4.0000 mg | ORAL_TABLET | Freq: Four times a day (QID) | ORAL | Status: DC | PRN

## 2022-03-17 MED ORDER — SODIUM CHLORIDE 0.9 % IV SOLN
500.0000 mg | INTRAVENOUS | Status: DC
  Administered 2022-03-18 – 2022-03-19 (×3): 500 mg via INTRAVENOUS
  Filled 2022-03-17 (×4): qty 5

## 2022-03-17 MED ORDER — LOSARTAN POTASSIUM 50 MG PO TABS
100.0000 mg | ORAL_TABLET | Freq: Every day | ORAL | Status: DC
  Administered 2022-03-18 – 2022-03-20 (×4): 100 mg via ORAL
  Filled 2022-03-17 (×3): qty 2
  Filled 2022-03-17: qty 4

## 2022-03-17 MED ORDER — FENTANYL CITRATE PF 50 MCG/ML IJ SOSY
50.0000 ug | PREFILLED_SYRINGE | Freq: Once | INTRAMUSCULAR | Status: AC
  Administered 2022-03-17: 50 ug via INTRAVENOUS
  Filled 2022-03-17: qty 1

## 2022-03-17 MED ORDER — ACETAMINOPHEN 325 MG PO TABS: 650.0000 mg | ORAL_TABLET | Freq: Four times a day (QID) | ORAL | Status: DC | PRN

## 2022-03-17 MED ORDER — ONDANSETRON HCL 4 MG/2ML IJ SOLN
4.0000 mg | Freq: Once | INTRAMUSCULAR | Status: AC | PRN
  Administered 2022-03-17: 4 mg via INTRAVENOUS
  Filled 2022-03-17: qty 2

## 2022-03-17 MED ORDER — AMLODIPINE BESYLATE 5 MG PO TABS
10.0000 mg | ORAL_TABLET | Freq: Every day | ORAL | Status: DC
  Administered 2022-03-19 – 2022-03-21 (×2): 10 mg via ORAL
  Filled 2022-03-17 (×3): qty 2

## 2022-03-17 MED ORDER — CLONIDINE HCL 0.2 MG PO TABS
0.2000 mg | ORAL_TABLET | Freq: Three times a day (TID) | ORAL | Status: DC
  Administered 2022-03-18 – 2022-03-21 (×11): 0.2 mg via ORAL
  Filled 2022-03-17 (×11): qty 1

## 2022-03-17 NOTE — H&P (Signed)
Reports ?History and Physical  ? ? ?[name] BPZ:[id number] DOB: Mar 12, 1981 DOA: 03/17/2022 ? ?PCP: Pcp, No  ? ?Patient coming from: Home ? ?I have personally briefly reviewed patient's old medical records in [place] ? ?Chief Complaint: Generalized body pain ? ?HPI: Cathy [name] is a 41 y.o. female with medical history significant for SLE with pericarditis, systolic and diastolic CHF, nephritis with ESRD, hypertension, MRSA bacteremia, noncompliance. ? ?Patient presented to the ED with complaints of generalized body pain that started after hemodialysis on Saturday.  She reports generalized cramps and spasms.  She reports it might be lupus pain, but she has never generalized body pains with her lupus in the past.  Pain is particularly worse in her abdominal area, and left flank area.  She reports intermittent difficulty breathing since HD on Saturday, with productive cough.  She reports feeling ill all over, with chills last night, nausea and dry heaving also.  Denies swelling of her joints. ? ?Recent hospitalizations- 4/1 to 03/05/22 for volume overload, ascites requiring paracentesis, overall of 3 L of fluid. ? ?3/1 to 02/08/2022 for MRSA bacteremia treated with IV vancomycin with hemodialysis, completed course. ? ?ED Course: Temperature 98.  Heart rate 60s to 70s.  Respiratory rate 12-17.  Blood pressure systolic 242P to 536R.  O2 sat greater than 97% on room air. ?WBC 5.9.  Troponin 62 x 2.  ?Chest x-ray without acute abnormality ?CT abdomen and pelvis contrast shows mild to moderate ascites, small bilateral possibly loculated pleural effusions, right lower lobe airspace opacity concerning for pneumonia or atelectasis. ? ?On talking to ED provider, initially concern for lupus flare, recommended patient would benefit from admission to tertiary care center with inpatient rheumatology.  Unfortunately [facility] is at capacity.  Hence hospitalist was reconsulted. ? ?Review of Systems: As per HPI all  other systems reviewed and negative. ? ?Past Medical History:  ?Diagnosis Date  ? Anasarca associated with disorder of kidney 06/08/2021  ? Anxiety   ? Asthma   ? Bipolar depression ([place])   ? Depression   ? Dyspnea   ? ESRD (end stage renal disease) on dialysis ([place]) 10/2020  ? GERD (gastroesophageal reflux disease)   ? Gonorrhea   ? Lupus ([place])   ? Pelvic inflammatory disease (PID)   ? Pleural effusion 06/08/2021  ? Renal hypertension   ? Trichomonas infection   ? ? ?Past Surgical History:  ?Procedure Laterality Date  ? IR FLUORO GUIDE CV LINE RIGHT  11/30/2020  ? IR FLUORO GUIDE CV LINE RIGHT  06/13/2021  ? IR FLUORO GUIDE CV LINE RIGHT  02/01/2022  ? IR REMOVAL TUN CV CATH W/O FL  06/11/2021  ? IR REMOVAL TUN CV CATH W/O FL  01/29/2022  ? IR US GUIDE VASC ACCESS RIGHT  11/30/2020  ? IR US GUIDE VASC ACCESS RIGHT  06/13/2021  ? IR US GUIDE VASC ACCESS RIGHT  02/03/2022  ? RENAL BIOPSY    ? TEE WITHOUT CARDIOVERSION N/A 06/13/2021  ? Procedure: TRANSESOPHAGEAL ECHOCARDIOGRAM (TEE);  Surgeon: Josue Hector, MD;  Location[facility] ENDOSCOPY;  Service: Cardiovascular;  Laterality: N/A;  ? TEE WITHOUT CARDIOVERSION N/A 02/03/2022  ? Procedure: TRANSESOPHAGEAL ECHOCARDIOGRAM (TEE);  Surgeon: Berniece Salines, DO;  Location: [place];  Service: Cardiovascular;  Laterality: N/A;  ? TUBAL LIGATION N/A 02/03/2019  ? Procedure: POST PARTUM TUBAL LIGATION;  Surgeon: Aletha Halim, MD;  Location: [facility] LD ORS;  Service: Gynecology;  Laterality: N/A;  ? ? ? reports that she has  been smoking cigarettes. She has never used smokeless tobacco. She reports that she does not currently use alcohol. She reports that she does not currently use drugs after having used the following drugs: Marijuana. ? ?Allergies  ?Allergen Reactions  ? Oxycodone Itching  ? ? ?Family History  ?Problem Relation Age of Onset  ? Diabetes Maternal Grandmother   ? Hypertension Maternal Grandmother   ? Diabetes Maternal Grandfather   ? Hypertension Maternal Grandfather   ?  Diabetes Paternal Grandmother   ? Hypertension Paternal Grandmother   ? Diabetes Paternal Grandfather   ? Hypertension Paternal Grandfather   ? ? ? ?Prior to Admission medications   ?Medication Sig Start Date End Date Taking? Authorizing Provider  ?acetaminophen (TYLENOL) 500 MG tablet Take 1,000 mg by mouth every 6 (six) hours as needed for moderate pain.   Yes [provider]  ?albuterol (VENTOLIN HFA) 108 (90 Base) MCG/ACT inhaler Inhale 2 puffs into the lungs every 6 (six) hours as needed for wheezing or shortness of breath. 12/17/21 12/17/22 Yes Rai, Ripudeep K, MD  ?amLODipine (NORVASC) 10 MG tablet Take 1 tablet (10 mg total) by mouth daily. 03/05/22  Yes Tat, Shanon Brow, MD  ?apixaban (ELIQUIS) 5 MG TABS tablet Take 1 tablet (5 mg total) by mouth 2 (two) times daily. 03/05/22  Yes Tat, Shanon Brow, MD  ?calcium acetate (PHOSLO) 667 MG capsule Take 2 capsules (1,334 mg total) by mouth 3 (three) times daily with meals. 03/05/22  Yes Tat, Shanon Brow, MD  ?carvedilol (COREG) 25 MG tablet Take 1 tablet (25 mg total) by mouth 2 (two) times daily with a meal. 03/05/22  Yes Tat, Shanon Brow, MD  ?cloNIDine (CATAPRES) 0.2 MG tablet Take 1 tablet (0.2 mg total) by mouth every 8 (eight) hours. 03/05/22  Yes TatShanon Brow, MD  ?Doxercalciferol (HECTOROL IV) Doxercalciferol (Hectorol) 03/06/22 03/05/23 Yes [provider]  ?guaiFENesin (MUCINEX) 600 MG 12 hr tablet Take 600 mg by mouth 2 (two) times daily as needed for cough.   Yes [provider]  ?hydrALAZINE (APRESOLINE) 100 MG tablet Take 1 tablet (100 mg total) by mouth 3 (three) times daily. 03/05/22  Yes Tat, Shanon Brow, MD  ?ipratropium (ATROVENT HFA) 17 MCG/ACT inhaler Inhale 2 puffs into the lungs every 6 (six) hours as needed for wheezing. 12/17/21 12/17/22 Yes Rai, Ripudeep K, MD  ?losartan (COZAAR) 100 MG tablet Take 1 tablet (100 mg total) by mouth at bedtime. 03/05/22  Yes TatShanon Brow, MD  ?Blood Pressure Monitoring (OMRON 3 SERIES BP MONITOR) DEVI Use as directed 2 times a day  01/09/22   Shelly Coss, MD  ?HYDROcodone-acetaminophen (NORCO/VICODIN) 5-325 MG tablet Take 1-2 tablets by mouth every 6 (six) hours as needed for moderate pain. ?Patient not taking: Reported on 03/17/2022 03/05/22   Orson Eva, MD  ?hydroxychloroquine (PLAQUENIL) 200 MG tablet Take 1/2 tablet (100 mg total) by mouth every other day after hemodialysis ?Patient not taking: Reported on 03/17/2022 01/09/22   Shelly Coss, MD  ?predniSONE (DELTASONE) 20 MG tablet Take 2 tablets (40 mg total) by mouth daily with breakfast. ?Patient not taking: Reported on 02/12/2022 12/31/21 04/05/22  Arrien, Jimmy Picket, MD  ?sevelamer carbonate (RENVELA) 800 MG tablet Take 1 tablet (800 mg total) by mouth 3 (three) times daily. ?Patient not taking: Reported on 03/17/2022 03/05/22   Orson Eva, MD  ?vancomycin (VANCOREADY) 750 MG/150ML SOLN Inject 150 mLs (750 mg total) into the vein Every Tuesday,Thursday,and Saturday with dialysis. Last dose on 03/11/22 ?Patient not taking: Reported on 03/17/2022 03/06/22  Orson Eva, MD  ?fenofibrate 160 MG tablet Take 1 tablet (160 mg total) by mouth daily. 12/31/20 01/20/21  Elmarie Shiley, MD  ? ? ?Physical Exam: ?Vitals:  ? 03/17/22 1800 03/17/22 1830 03/17/22 1900 03/17/22 2030  ?BP: (!) 149/108 (!) 152/107 (!) 144/95 (!) 158/106  ?Pulse: 67 67 66 66  ?Resp: 12 12 16 16   ?Temp:      ?TempSrc:      ?SpO2: 96% 98% 97% 97%  ?Weight:      ?Height:      ? ? ?Constitutional: Generalized hyperpigmentation of skin, chronically ill-appearing ?Vitals:  ? 03/17/22 1800 03/17/22 1830 03/17/22 1900 03/17/22 2030  ?BP: (!) 149/108 (!) 152/107 (!) 144/95 (!) 158/106  ?Pulse: 67 67 66 66  ?Resp: 12 12 16 16   ?Temp:      ?TempSrc:      ?SpO2: 96% 98% 97% 97%  ?Weight:      ?Height:      ? ?Eyes: PERRL, lids and conjunctivae normal ?ENMT: Mucous membranes are mildly dry ?Neck: normal, supple, no masses, no thyromegaly ?Respiratory: clear to auscultation bilaterally, no wheezing, no crackles. Normal respiratory effort.  No accessory muscle use.  ?Cardiovascular: Regular rate and rhythm, 2/6 systolic murmur loudest aortic area  No extremity edema.  Lower extremities warm.  ?Abdomen: Abdomen markedly tender even to very ligh

## 2022-03-17 NOTE — Assessment & Plan Note (Addendum)
Treating as RLL pneumonia. She reports dyspnea, cough, fatigue, chills.  She is afebrile without leukocytosis.  Rules out for sepsis.  CT abdomen and pelvis showing atelectasis versus pneumonia, small bilateral and possibly loculated pleural effusions. ?-Continue ceftriaxone and azithromycin.  ?- If worsens, would add MRSA coverage given her previous infection (albeit completed treatment) ?

## 2022-03-17 NOTE — Assessment & Plan Note (Addendum)
Uncontrolled. ?- Resumed hydralazine, carvedilol, norvasc, clonidine, losartan. ?

## 2022-03-17 NOTE — Assessment & Plan Note (Addendum)
Continue eliquis  ?

## 2022-03-17 NOTE — Assessment & Plan Note (Addendum)
Afebrile without leukocytosis, though abdominal exam is considerably tender. Insufficient ascites for paracentesis, though on empiric SBP coverage with ceftriaxone.  ?- Constipation certainly contributing. Will augment bowel regimen.  ?- No other emergent conditions noted on CT abd/pelvis.  ?- Supportive care with antiemetics and analgesics. Due to renal failure and unclear etiology of pain, will continue with dilaudid. She's not having emesis, so will give PO in lieu of IV. ?

## 2022-03-17 NOTE — ED Notes (Signed)
Pt states that pain is 9/10. Dr notified. ?

## 2022-03-17 NOTE — ED Provider Notes (Signed)
?[facility] ?Provider Note ? ? ?CSN: [id number] ?Arrival date & time: 03/17/22  1017 ? ?  ? ?History ?Chief Complaint  ?Patient presents with  ? Pain  ? ? ?Cathy [name] is a 41 y.o. female with history of lupus, end-stage renal disease on dialysis Tuesday Thursday Saturday, ascites, GERD, lupus nephritis, and medication noncompliance presents to the emergency department for evaluation of diffuse body aches since Saturday.  Patient reports that she created her full dialysis session on Saturday and started to have some left flank pain that slowly turned into full body pain over the past 3 days.  She reports she has had abdominal tension off and on for the past few years and was seen had a paracentesis.  She reports some nausea as well as some intermittent chest pain and shortness of breath.  She reports she is having a headache yesterday but denies any neck pain, joint swelling, or joint stiffness.  Reports she has not taken her blood pressure medicines today.  She denies any fever, cough, congestion, or blurry vision.  The patient reports she still makes urine.  The patient is not taking her Plaquenil or steroids for her lupus.  She reports that this feels like her lupus flare. ? ?HPI ? ?  ? ?Home Medications ?Prior to Admission medications   ?Medication Sig Start Date End Date Taking? Authorizing Provider  ?acetaminophen (TYLENOL) 500 MG tablet Take 1,000 mg by mouth every 6 (six) hours as needed for moderate pain.    [provider]  ?albuterol (VENTOLIN HFA) 108 (90 Base) MCG/ACT inhaler Inhale 2 puffs into the lungs every 6 (six) hours as needed for wheezing or shortness of breath. 12/17/21 12/17/22  Rai, Vernelle Emerald, MD  ?amLODipine (NORVASC) 10 MG tablet Take 1 tablet (10 mg total) by mouth daily. 03/05/22   Orson Eva, MD  ?apixaban (ELIQUIS) 5 MG TABS tablet Take 1 tablet (5 mg total) by mouth 2 (two) times daily. 03/05/22   Orson Eva, MD  ?Blood Pressure Monitoring (OMRON 3 SERIES  BP MONITOR) DEVI Use as directed 2 times a day 01/09/22   Shelly Coss, MD  ?calcium acetate (PHOSLO) 667 MG capsule Take 2 capsules (1,334 mg total) by mouth 3 (three) times daily with meals. 03/05/22   Orson Eva, MD  ?carvedilol (COREG) 25 MG tablet Take 1 tablet (25 mg total) by mouth 2 (two) times daily with a meal. 03/05/22   Tat, Shanon Brow, MD  ?cloNIDine (CATAPRES) 0.2 MG tablet Take 1 tablet (0.2 mg total) by mouth every 8 (eight) hours. 03/05/22   Orson Eva, MD  ?guaiFENesin (MUCINEX) 600 MG 12 hr tablet Take 600 mg by mouth 2 (two) times daily as needed for cough.    [provider]  ?hydrALAZINE (APRESOLINE) 100 MG tablet Take 1 tablet (100 mg total) by mouth 3 (three) times daily. 03/05/22   Orson Eva, MD  ?HYDROcodone-acetaminophen (NORCO/VICODIN) 5-325 MG tablet Take 1-2 tablets by mouth every 6 (six) hours as needed for moderate pain. 03/05/22   Orson Eva, MD  ?hydroxychloroquine (PLAQUENIL) 200 MG tablet Take 1/2 tablet (100 mg total) by mouth every other day after hemodialysis 01/09/22   Shelly Coss, MD  ?ipratropium (ATROVENT HFA) 17 MCG/ACT inhaler Inhale 2 puffs into the lungs every 6 (six) hours as needed for wheezing. 12/17/21 12/17/22  Rai, Vernelle Emerald, MD  ?losartan (COZAAR) 100 MG tablet Take 1 tablet (100 mg total) by mouth at bedtime. 03/05/22   Orson Eva, MD  ?predniSONE (  DELTASONE) 20 MG tablet Take 2 tablets (40 mg total) by mouth daily with breakfast. ?Patient not taking: Reported on 02/12/2022 12/31/21 04/05/22  Arrien, Jimmy Picket, MD  ?sevelamer carbonate (RENVELA) 800 MG tablet Take 1 tablet (800 mg total) by mouth 3 (three) times daily. 03/05/22   Orson Eva, MD  ?vancomycin (VANCOREADY) 750 MG/150ML SOLN Inject 150 mLs (750 mg total) into the vein Every Tuesday,Thursday,and Saturday with dialysis. Last dose on 03/11/22 03/06/22   Orson Eva, MD  ?fenofibrate 160 MG tablet Take 1 tablet (160 mg total) by mouth daily. 12/31/20 01/20/21  Elmarie Shiley, MD  ?   ? ?Allergies    ?Oxycodone    ? ?Review of Systems   ?Review of Systems  ?Constitutional:  Negative for chills and fever.  ?HENT:  Negative for congestion, rhinorrhea and sore throat.   ?Respiratory:  Positive for shortness of breath. Negative for cough.   ?Cardiovascular:  Positive for chest pain. Negative for palpitations.  ?Gastrointestinal:  Positive for abdominal distention, abdominal pain and nausea. Negative for constipation, diarrhea and vomiting.  ?Musculoskeletal:  Positive for myalgias. Negative for joint swelling, neck pain and neck stiffness.  ?Neurological:  Positive for headaches. Negative for dizziness, syncope, weakness and light-headedness.  ? ?Physical Exam ?Updated Vital Signs ?BP (!) 163/115   Pulse 70   Temp 98 ?F (36.7 ?C) (Oral)   Resp 13   Ht 5\' 7"  (1.702 m)   Wt 54.4 kg   SpO2 98%   BMI 18.79 kg/m?  ?Physical Exam ?Vitals and nursing note reviewed.  ?Constitutional:   ?   General: She is not in acute distress. ?   Appearance: She is not toxic-appearing.  ?HENT:  ?   Head: Normocephalic and atraumatic.  ?   Mouth/Throat:  ?   Mouth: Mucous membranes are moist.  ?   Pharynx: No oropharyngeal exudate or posterior oropharyngeal erythema.  ?Eyes:  ?   General: No scleral icterus. ?   Extraocular Movements: Extraocular movements intact.  ?   Pupils: Pupils are equal, round, and reactive to light.  ?Cardiovascular:  ?   Rate and Rhythm: Normal rate and regular rhythm.  ?Pulmonary:  ?   Effort: Pulmonary effort is normal. No respiratory distress.  ?   Breath sounds: Normal breath sounds.  ?   Comments: Clear to auscultation bilaterally. ?Abdominal:  ?   General: Bowel sounds are normal. There is distension.  ?   Palpations: Abdomen is soft.  ?   Tenderness: There is abdominal tenderness. There is guarding. There is no rebound.  ?   Comments: Abdomen is mildly distended although patient's abdomen is still soft.  Normal active bowel sounds.  She does have diffuse abdominal tenderness with some voluntary guarding, no  rebound tenderness.  No overlying skin changes noted.  ?Musculoskeletal:     ?   General: No deformity.  ?   Cervical back: Normal range of motion and neck supple. No rigidity or tenderness.  ?   Comments: No joint swelling noted.  Patient has diffuse tenderness to the back, no focalized or point tenderness.  No overlying erythema or warmth noted.  No obvious swelling.  No step-off or deformity noted.  ?Lymphadenopathy:  ?   Cervical: No cervical adenopathy.  ?Skin: ?   General: Skin is warm and dry.  ?Neurological:  ?   General: No focal deficit present.  ?   Mental Status: She is alert. Mental status is at baseline.  ?   GCS: GCS  eye subscore is 4. GCS verbal subscore is 5. GCS motor subscore is 6.  ?   Cranial Nerves: No cranial nerve deficit or facial asymmetry.  ?   Sensory: No sensory deficit.  ?   Motor: No weakness.  ?   Coordination: Finger-Nose-Finger Test normal.  ? ? ?ED Results / Procedures / Treatments   ?Labs ?(all labs ordered are listed, but only abnormal results are displayed) ?Labs Reviewed  ?CBC WITH DIFFERENTIAL/PLATELET - Abnormal; Notable for the following components:  ?    Result Value  ? Hemoglobin 11.0 (*)   ? HCT 35.2 (*)   ? RDW 18.4 (*)   ? All other components within normal limits  ?COMPREHENSIVE METABOLIC PANEL - Abnormal; Notable for the following components:  ? Chloride 96 (*)   ? BUN 32 (*)   ? Creatinine, Ser 7.49 (*)   ? Calcium 8.8 (*)   ? Albumin 3.2 (*)   ? Alkaline Phosphatase 278 (*)   ? GFR, Estimated 7 (*)   ? All other components within normal limits  ?LIPASE, BLOOD - Abnormal; Notable for the following components:  ? Lipase 59 (*)   ? All other components within normal limits  ?TROPONIN I (HIGH SENSITIVITY) - Abnormal; Notable for the following components:  ? Troponin I (High Sensitivity) 62 (*)   ? All other components within normal limits  ?TROPONIN I (HIGH SENSITIVITY)  ? ? ?EKG ?None ? ?Radiology ?CT ABDOMEN PELVIS WO CONTRAST ? ?Result Date: 03/17/2022 ?CLINICAL DATA:   Acute left-sided abdominal pain. EXAM: CT ABDOMEN AND PELVIS WITHOUT CONTRAST TECHNIQUE: Multidetector CT imaging of the abdomen and pelvis was performed following the standard protocol without IV contrast. R

## 2022-03-17 NOTE — Assessment & Plan Note (Addendum)
Last echo was TEE 01/2022, EF of 35 to 40%. Compensated with volume managed by dialysis.  ?

## 2022-03-17 NOTE — ED Triage Notes (Signed)
Pt brought in by [facility] from home with c/o pain all over since after dialysis on Saturday. Pt reports hx of lupus and is concerned it may be a lupus flare-up. Pt took a muscle relaxer with no relief of pain.  BP 172/100, HR 66, 100% RA for EMS.  ?

## 2022-03-17 NOTE — Assessment & Plan Note (Addendum)
-   Nephrology consulted, routine HD performed 4/18 (TTS schedule) ?

## 2022-03-17 NOTE — Assessment & Plan Note (Addendum)
Reports generalized body pain though no rash or arthritis/joint swelling noted.  ?- ESR is 38, CRP 2.4. Reports she's not been taking hydroxychloroquine.  ?- Complement is pending. If low or objective evidence of lupus flare presents, would reinitiate transfer request to [facility] for rheumatology evaluation. ?

## 2022-03-17 NOTE — ED Notes (Signed)
Patient transported to CT 

## 2022-03-17 NOTE — ED Notes (Signed)
Patient complaining of abdominal pain and nausea.  ?

## 2022-03-18 ENCOUNTER — Inpatient Hospital Stay (HOSPITAL_COMMUNITY): Payer: Medicare Other

## 2022-03-18 ENCOUNTER — Other Ambulatory Visit: Payer: Self-pay

## 2022-03-18 DIAGNOSIS — M3212 Pericarditis in systemic lupus erythematosus: Secondary | ICD-10-CM

## 2022-03-18 DIAGNOSIS — F339 Major depressive disorder, recurrent, unspecified: Secondary | ICD-10-CM

## 2022-03-18 DIAGNOSIS — R1084 Generalized abdominal pain: Secondary | ICD-10-CM | POA: Diagnosis not present

## 2022-03-18 DIAGNOSIS — I10 Essential (primary) hypertension: Secondary | ICD-10-CM | POA: Diagnosis not present

## 2022-03-18 DIAGNOSIS — I2699 Other pulmonary embolism without acute cor pulmonale: Secondary | ICD-10-CM | POA: Diagnosis not present

## 2022-03-18 DIAGNOSIS — L299 Pruritus, unspecified: Secondary | ICD-10-CM

## 2022-03-18 DIAGNOSIS — R188 Other ascites: Secondary | ICD-10-CM | POA: Diagnosis not present

## 2022-03-18 LAB — CBC
HCT: 37 % (ref 36.0–46.0)
HCT: 36.1 % (ref 36.0–46.0)
Hemoglobin: 11.2 g/dL — ABNORMAL LOW (ref 12.0–15.0)
Hemoglobin: 11.1 g/dL — ABNORMAL LOW (ref 12.0–15.0)
MCH: 27.3 pg (ref 26.0–34.0)
MCH: 27.5 pg (ref 26.0–34.0)
MCHC: 30.3 g/dL (ref 30.0–36.0)
MCHC: 30.7 g/dL (ref 30.0–36.0)
MCV: 90 fL (ref 80.0–100.0)
MCV: 89.6 fL (ref 80.0–100.0)
Platelets: 261 10*3/uL (ref 150–400)
Platelets: 273 10*3/uL (ref 150–400)
RBC: 4.11 MIL/uL (ref 3.87–5.11)
RBC: 4.03 MIL/uL (ref 3.87–5.11)
RDW: 18.2 % — ABNORMAL HIGH (ref 11.5–15.5)
RDW: 18.1 % — ABNORMAL HIGH (ref 11.5–15.5)
WBC: 7 10*3/uL (ref 4.0–10.5)
WBC: 6.3 10*3/uL (ref 4.0–10.5)
nRBC: 0 % (ref 0.0–0.2)
nRBC: 0 % (ref 0.0–0.2)

## 2022-03-18 LAB — BASIC METABOLIC PANEL
Anion gap: 15 (ref 5–15)
BUN: 43 mg/dL — ABNORMAL HIGH (ref 6–20)
CO2: 23 mmol/L (ref 22–32)
Calcium: 8.4 mg/dL — ABNORMAL LOW (ref 8.9–10.3)
Chloride: 97 mmol/L — ABNORMAL LOW (ref 98–111)
Creatinine, Ser: 8.6 mg/dL — ABNORMAL HIGH (ref 0.44–1.00)
GFR, Estimated: 6 mL/min — ABNORMAL LOW (ref >60)
Glucose, Bld: 75 mg/dL (ref 70–99)
Potassium: 4.2 mmol/L (ref 3.5–5.1)
Sodium: 135 mmol/L (ref 135–145)

## 2022-03-18 LAB — HEPATITIS B SURFACE ANTIGEN: Hepatitis B Surface Ag: NONREACTIVE

## 2022-03-18 LAB — HEPATITIS B SURFACE ANTIBODY,QUALITATIVE: Hep B S Ab: REACTIVE — AB

## 2022-03-18 LAB — CBG MONITORING, ED: Glucose-Capillary: 82 mg/dL (ref 70–99)

## 2022-03-18 MED ORDER — PENTAFLUOROPROP-TETRAFLUOROETH EX AERO: 1.0000 "application " | INHALATION_SPRAY | CUTANEOUS | Status: DC | PRN

## 2022-03-18 MED ORDER — SODIUM CHLORIDE 0.9 % IV SOLN: 100.0000 mL | INTRAVENOUS | Status: DC | PRN

## 2022-03-18 MED ORDER — DIPHENHYDRAMINE HCL 25 MG PO CAPS
50.0000 mg | ORAL_CAPSULE | Freq: Four times a day (QID) | ORAL | Status: DC | PRN
  Administered 2022-03-18 – 2022-03-20 (×2): 50 mg via ORAL
  Filled 2022-03-18 (×2): qty 2

## 2022-03-18 MED ORDER — NEPRO/CARBSTEADY PO LIQD
237.0000 mL | Freq: Two times a day (BID) | ORAL | Status: DC
Start: 2022-03-18 — End: 2022-03-18

## 2022-03-18 MED ORDER — APIXABAN 5 MG PO TABS
5.0000 mg | ORAL_TABLET | Freq: Two times a day (BID) | ORAL | Status: DC
  Administered 2022-03-18 – 2022-03-21 (×5): 5 mg via ORAL
  Filled 2022-03-18 (×6): qty 1

## 2022-03-18 MED ORDER — CHLORHEXIDINE GLUCONATE CLOTH 2 % EX PADS
6.0000 | MEDICATED_PAD | Freq: Every day | CUTANEOUS | Status: DC
  Administered 2022-03-18 – 2022-03-21 (×3): 6 via TOPICAL

## 2022-03-18 MED ORDER — CHLORHEXIDINE GLUCONATE CLOTH 2 % EX PADS
6.0000 | MEDICATED_PAD | Freq: Every day | CUTANEOUS | Status: DC
  Administered 2022-03-18 – 2022-03-21 (×4): 6 via TOPICAL

## 2022-03-18 MED ORDER — SENNOSIDES-DOCUSATE SODIUM 8.6-50 MG PO TABS
2.0000 | ORAL_TABLET | Freq: Two times a day (BID) | ORAL | Status: DC
  Administered 2022-03-18 – 2022-03-21 (×5): 2 via ORAL
  Filled 2022-03-18 (×6): qty 2

## 2022-03-18 MED ORDER — HYDROMORPHONE HCL 2 MG PO TABS
2.0000 mg | ORAL_TABLET | ORAL | Status: DC | PRN
  Administered 2022-03-18 (×2): 4 mg via ORAL
  Filled 2022-03-18 (×2): qty 2

## 2022-03-18 MED ORDER — DIPHENHYDRAMINE HCL 50 MG/ML IJ SOLN
50.0000 mg | Freq: Once | INTRAMUSCULAR | Status: AC
  Administered 2022-03-18: 50 mg via INTRAVENOUS

## 2022-03-18 MED ORDER — LIDOCAINE-PRILOCAINE 2.5-2.5 % EX CREA: 1.0000 "application " | TOPICAL_CREAM | CUTANEOUS | Status: DC | PRN

## 2022-03-18 MED ORDER — ALTEPLASE 2 MG IJ SOLR: 2.0000 mg | Freq: Once | INTRAMUSCULAR | Status: DC | PRN

## 2022-03-18 MED ORDER — ENSURE ENLIVE PO LIQD
237.0000 mL | Freq: Two times a day (BID) | ORAL | Status: DC
  Administered 2022-03-19 – 2022-03-21 (×3): 237 mL via ORAL

## 2022-03-18 MED ORDER — HYDROMORPHONE HCL 4 MG PO TABS
4.0000 mg | ORAL_TABLET | ORAL | Status: DC | PRN
  Administered 2022-03-18 – 2022-03-19 (×2): 6 mg via ORAL
  Administered 2022-03-19 (×2): 4 mg via ORAL
  Filled 2022-03-18 (×2): qty 1
  Filled 2022-03-18 (×2): qty 2

## 2022-03-18 MED ORDER — HYDROMORPHONE HCL 1 MG/ML IJ SOLN
0.5000 mg | Freq: Once | INTRAMUSCULAR | Status: AC
  Administered 2022-03-18: 0.5 mg via INTRAVENOUS
  Filled 2022-03-18: qty 0.5

## 2022-03-18 MED ORDER — TRAMADOL HCL 50 MG PO TABS
50.0000 mg | ORAL_TABLET | Freq: Once | ORAL | Status: AC
  Administered 2022-03-18: 50 mg via ORAL
  Filled 2022-03-18: qty 1

## 2022-03-18 MED ORDER — LIDOCAINE HCL (PF) 1 % IJ SOLN: 5.0000 mL | INTRAMUSCULAR | Status: DC | PRN

## 2022-03-18 MED ORDER — HEPARIN SODIUM (PORCINE) 1000 UNIT/ML IJ SOLN
INTRAMUSCULAR | Status: AC
  Administered 2022-03-18: 1400 [IU] via INTRAVENOUS_CENTRAL
  Filled 2022-03-18: qty 4

## 2022-03-18 MED ORDER — SORBITOL 70 % SOLN: 30.0000 mL | Freq: Every day | Status: DC | PRN

## 2022-03-18 MED ORDER — DIPHENHYDRAMINE HCL 50 MG/ML IJ SOLN
INTRAMUSCULAR | Status: AC
  Filled 2022-03-18: qty 1

## 2022-03-18 MED ORDER — HEPARIN SODIUM (PORCINE) 1000 UNIT/ML DIALYSIS: 1000.0000 [IU] | INTRAMUSCULAR | Status: DC | PRN

## 2022-03-18 MED ORDER — HEPARIN SODIUM (PORCINE) 1000 UNIT/ML DIALYSIS: 20.0000 [IU]/kg | INTRAMUSCULAR | Status: DC | PRN

## 2022-03-18 NOTE — Consult Note (Signed)
[facility] ?Renal Consultation Note  ?  ?Indication for Consultation:  Management of ESRD/hemodialysis; anemia, hypertension/volume and secondary hyperparathyroidism ? ?HPI: Cathy [name] is a 41 y.o. female with past medical history significant for HTN-  severe, lupus, psychiatric disorder with medical noncompliance and substance abuse, fairly recent covid infection as well as hospitalzation in March for MRSA bacteremia  had TDC removed and line holiday-  TEE neg for veg-  was supposed to be on vanc at HD until 4/10.  She presented to [facility] ED on 03/17/22 c/o generalized body pain, lower abdominal and back pain, fevers, chills, and cough productive of yellow sputum.  In the ED she was afebrile and VSS.  CXR without acute abnormality. CT scan of abd and pelvis showed moderate ascites, bilateral pleural effusions, possibly loculated, and RLL airspace disease concerning for pneumonia or atelectasis.  She was admitted for further evaluation and we were consulted to provide dialysis during her hospitalization.  A transfer to [facility] for inpatient rheumatology consultation was attempted, however they are at capacity. ? ?Past Medical History:  ?Diagnosis Date  ? Anasarca associated with disorder of kidney 06/08/2021  ? Anxiety   ? Asthma   ? Bipolar depression ([place])   ? Depression   ? Dyspnea   ? ESRD (end stage renal disease) on dialysis ([place]) 10/2020  ? GERD (gastroesophageal reflux disease)   ? Gonorrhea   ? Lupus ([place])   ? Pelvic inflammatory disease (PID)   ? Pleural effusion 06/08/2021  ? Renal hypertension   ? Trichomonas infection   ? ?Past Surgical History:  ?Procedure Laterality Date  ? IR FLUORO GUIDE CV LINE RIGHT  11/30/2020  ? IR FLUORO GUIDE CV LINE RIGHT  06/13/2021  ? IR FLUORO GUIDE CV LINE RIGHT  02/01/2022  ? IR REMOVAL TUN CV CATH W/O FL  06/11/2021  ? IR REMOVAL TUN CV CATH W/O FL  01/29/2022  ? IR US GUIDE VASC ACCESS RIGHT  11/30/2020  ? IR US GUIDE VASC ACCESS RIGHT  06/13/2021  ? IR US  GUIDE VASC ACCESS RIGHT  02/03/2022  ? RENAL BIOPSY    ? TEE WITHOUT CARDIOVERSION N/A 06/13/2021  ? Procedure: TRANSESOPHAGEAL ECHOCARDIOGRAM (TEE);  Surgeon: Josue Hector, MD;  Location: [facility] ENDOSCOPY;  Service: Cardiovascular;  Laterality: N/A;  ? TEE WITHOUT CARDIOVERSION N/A 02/03/2022  ? Procedure: TRANSESOPHAGEAL ECHOCARDIOGRAM (TEE);  Surgeon: Berniece Salines, DO;  Location: [place];  Service: Cardiovascular;  Laterality: N/A;  ? TUBAL LIGATION N/A 02/03/2019  ? Procedure: POST PARTUM TUBAL LIGATION;  Surgeon: Aletha Halim, MD;  Location: [facility] LD ORS;  Service: Gynecology;  Laterality: N/A;  ? ?Family History:   ?Family History  ?Problem Relation Age of Onset  ? Diabetes Maternal Grandmother   ? Hypertension Maternal Grandmother   ? Diabetes Maternal Grandfather   ? Hypertension Maternal Grandfather   ? Diabetes Paternal Grandmother   ? Hypertension Paternal Grandmother   ? Diabetes Paternal Grandfather   ? Hypertension Paternal Grandfather   ? ?Social History: ? reports that she has been smoking cigarettes. She has never used smokeless tobacco. She reports that she does not currently use alcohol. She reports that she does not currently use drugs after having used the following drugs: Marijuana. ?Allergies  ?Allergen Reactions  ? Oxycodone Itching  ? ?Prior to Admission medications   ?Medication Sig Start Date End Date Taking? Authorizing Provider  ?acetaminophen (TYLENOL) 500 MG tablet Take 1,000 mg by mouth every 6 (six) hours as needed for moderate  pain.   Yes [provider]  ?albuterol (VENTOLIN HFA) 108 (90 Base) MCG/ACT inhaler Inhale 2 puffs into the lungs every 6 (six) hours as needed for wheezing or shortness of breath. 12/17/21 12/17/22 Yes Rai, Ripudeep K, MD  ?amLODipine (NORVASC) 10 MG tablet Take 1 tablet (10 mg total) by mouth daily. 03/05/22  Yes Tat, Shanon Brow, MD  ?apixaban (ELIQUIS) 5 MG TABS tablet Take 1 tablet (5 mg total) by mouth 2 (two) times daily. 03/05/22  Yes Tat, Shanon Brow, MD   ?calcium acetate (PHOSLO) 667 MG capsule Take 2 capsules (1,334 mg total) by mouth 3 (three) times daily with meals. 03/05/22  Yes Tat, Shanon Brow, MD  ?carvedilol (COREG) 25 MG tablet Take 1 tablet (25 mg total) by mouth 2 (two) times daily with a meal. 03/05/22  Yes Tat, Shanon Brow, MD  ?cloNIDine (CATAPRES) 0.2 MG tablet Take 1 tablet (0.2 mg total) by mouth every 8 (eight) hours. 03/05/22  Yes TatShanon Brow, MD  ?Doxercalciferol (HECTOROL IV) Doxercalciferol (Hectorol) 03/06/22 03/05/23 Yes [provider]  ?guaiFENesin (MUCINEX) 600 MG 12 hr tablet Take 600 mg by mouth 2 (two) times daily as needed for cough.   Yes [provider]  ?hydrALAZINE (APRESOLINE) 100 MG tablet Take 1 tablet (100 mg total) by mouth 3 (three) times daily. 03/05/22  Yes Tat, Shanon Brow, MD  ?ipratropium (ATROVENT HFA) 17 MCG/ACT inhaler Inhale 2 puffs into the lungs every 6 (six) hours as needed for wheezing. 12/17/21 12/17/22 Yes Rai, Ripudeep K, MD  ?losartan (COZAAR) 100 MG tablet Take 1 tablet (100 mg total) by mouth at bedtime. 03/05/22  Yes TatShanon Brow, MD  ?Blood Pressure Monitoring (OMRON 3 SERIES BP MONITOR) DEVI Use as directed 2 times a day 01/09/22   Shelly Coss, MD  ?HYDROcodone-acetaminophen (NORCO/VICODIN) 5-325 MG tablet Take 1-2 tablets by mouth every 6 (six) hours as needed for moderate pain. ?Patient not taking: Reported on 03/17/2022 03/05/22   Orson Eva, MD  ?hydroxychloroquine (PLAQUENIL) 200 MG tablet Take 1/2 tablet (100 mg total) by mouth every other day after hemodialysis ?Patient not taking: Reported on 03/17/2022 01/09/22   Shelly Coss, MD  ?predniSONE (DELTASONE) 20 MG tablet Take 2 tablets (40 mg total) by mouth daily with breakfast. ?Patient not taking: Reported on 02/12/2022 12/31/21 04/05/22  Arrien, Jimmy Picket, MD  ?sevelamer carbonate (RENVELA) 800 MG tablet Take 1 tablet (800 mg total) by mouth 3 (three) times daily. ?Patient not taking: Reported on 03/17/2022 03/05/22   Orson Eva, MD  ?vancomycin (VANCOREADY) 750  MG/150ML SOLN Inject 150 mLs (750 mg total) into the vein Every Tuesday,Thursday,and Saturday with dialysis. Last dose on 03/11/22 ?Patient not taking: Reported on 03/17/2022 03/06/22   Orson Eva, MD  ?fenofibrate 160 MG tablet Take 1 tablet (160 mg total) by mouth daily. 12/31/20 01/20/21  Elmarie Shiley, MD  ? ?Current Facility-Administered Medications  ?Medication Dose Route Frequency Provider Last Rate Last Admin  ? acetaminophen (TYLENOL) tablet 650 mg  650 mg Oral Q6H PRN Emokpae, Ejiroghene E, MD      ? Or  ? acetaminophen (TYLENOL) suppository 650 mg  650 mg Rectal Q6H PRN Emokpae, Ejiroghene E, MD      ? amLODipine (NORVASC) tablet 10 mg  10 mg Oral Daily Emokpae, Ejiroghene E, MD      ? azithromycin (ZITHROMAX) 500 mg in sodium chloride 0.9 % 250 mL IVPB  500 mg Intravenous Q24H Emokpae, Ejiroghene E, MD   Stopped at 03/18/22 0458  ? carvedilol (COREG) tablet 25 mg  25 mg Oral BID WC Emokpae, Ejiroghene E, MD   25 mg at 03/17/22 1642  ? cefTRIAXone (ROCEPHIN) 2 g in sodium chloride 0.9 % 100 mL IVPB  2 g Intravenous Q24H Emokpae, Ejiroghene E, MD   Stopped at 03/18/22 0249  ? Chlorhexidine Gluconate Cloth 2 % PADS 6 each  6 each Topical Daily Emokpae, Ejiroghene E, MD      ? cloNIDine (CATAPRES) tablet 0.2 mg  0.2 mg Oral Q8H Emokpae, Ejiroghene E, MD   0.2 mg at 03/18/22 0615  ? feeding supplement (NEPRO CARB STEADY) liquid 237 mL  237 mL Oral BID BM Emokpae, Ejiroghene E, MD      ? hydrALAZINE (APRESOLINE) tablet 100 mg  100 mg Oral TID Emokpae, Ejiroghene E, MD   100 mg at 03/18/22 0123  ? losartan (COZAAR) tablet 100 mg  100 mg Oral QHS Emokpae, Ejiroghene E, MD   100 mg at 03/18/22 0120  ? ondansetron (ZOFRAN) tablet 4 mg  4 mg Oral Q6H PRN Emokpae, Ejiroghene E, MD      ? Or  ? ondansetron (ZOFRAN) injection 4 mg  4 mg Intravenous Q6H PRN Emokpae, Ejiroghene E, MD      ? polyethylene glycol (MIRALAX / GLYCOLAX) packet 17 g  17 g Oral BID Emokpae, Ejiroghene E, MD      ? ?Labs: ?Basic Metabolic  Panel: ?Recent Labs  ?Lab 03/17/22 ?1246 03/18/22 ?0358  ?NA 135 135  ?K 3.9 4.2  ?CL 96* 97*  ?CO2 26 23  ?GLUCOSE 83 75  ?BUN 32* 43*  ?CREATININE 7.49* 8.60*  ?CALCIUM 8.8* 8.4*  ? ?Liver Function Tests: ?Recent [place]

## 2022-03-18 NOTE — ED Notes (Signed)
Pt ambulated to restroom. 

## 2022-03-18 NOTE — TOC Initial Note (Signed)
Transition of Care (TOC) - Initial/Assessment Note  ? ? ?Patient Details  ?Name: Cathy [name] ?MRN: [id number] ?Date of Birth: 05-Jan-1981 ? ?Transition of Care (TOC) CM/SW Contact:    ?Shade Flood, LCSW ?Phone Number: ?03/18/2022, 9:21 AM ? ?Clinical Narrative:                 ? ?Pt admitted from home. She has a high readmission risk score. Reviewed TOC notes from pt's recent admission earlier this month and spoke with pt to assess. Per pt, she has been staying with her mother since her previous dc and she has been attending her outpatient HD at [facility] on a TRS schedule. Pt states she has transportation and that she has been following her treatment schedule. Reviewed pt's home O2 status and she states that she does not yet have it straightened out but that she is supposed to be getting her home O2 setup at the beginning of next month. Pt states she gets O2 while at HD and that helps. ? ?Osborne Casco with [employer] to request further information on the status of pt's O2. ? ?Pt is not anticipating any other TOC needs for dc. Will follow. ? ?Expected Discharge Plan: Home/Self Care ?Barriers to Discharge: Continued Medical Work up ? ? ?Patient Goals and CMS Choice ?Patient states their goals for this hospitalization and ongoing recovery are:: return to mother's house ?  ?  ? ?Expected Discharge Plan and Services ?Expected Discharge Plan: Home/Self Care ?In-house Referral: Clinical Social Work ?  ?  ?Living arrangements for the past 2 months: [place] ?                ?  ?  ?  ?  ?  ?  ?  ?  ?  ?  ? ?Prior Living Arrangements/Services ?Living arrangements for the past 2 months: [place] ?Lives with:: Parents ?  ?Do you feel safe going back to the place where you live?: Yes      ?Need for Family Participation in Patient Care: No (Comment) ?Care giver support system in place?: Yes (comment) ?  ?Criminal Activity/Legal Involvement Pertinent to Current Situation/Hospitalization: No -  Comment as needed ? ?Activities of Daily Living ?Home Assistive Devices/Equipment: None ?ADL Screening (condition at time of admission) ?Patient's cognitive ability adequate to safely complete daily activities?: Yes ?Is the patient deaf or have difficulty hearing?: No ?Does the patient have difficulty seeing, even when wearing glasses/contacts?: No ?Does the patient have difficulty concentrating, remembering, or making decisions?: No ?Patient able to express need for assistance with ADLs?: Yes ?Does the patient have difficulty dressing or bathing?: No ?Independently performs ADLs?: Yes (appropriate for developmental age) ?Does the patient have difficulty walking or climbing stairs?: No ?Weakness of Legs: None ?Weakness of Arms/Hands: None ? ?Permission Sought/Granted ?  ?  ?   ?   ?   ?   ? ?Emotional Assessment ?Appearance:: Appears stated age ?Attitude/Demeanor/Rapport: Engaged ?Affect (typically observed): Pleasant ?Orientation: : Oriented to Self, Oriented to Place, Oriented to  Time, Oriented to Situation ?Alcohol / Substance Use: Not Applicable ?Psych Involvement: No (comment) ? ?Admission diagnosis:  Lupus ([place]) [M32.9] ?Other ascites [R18.8] ?Abdominal pain [R10.9] ?Patient Active Problem List  ? Diagnosis Date Noted  ? Abdominal pain 03/17/2022  ? Ascites 03/02/2022  ? Volume overload 03/01/2022  ? MRSA bacteremia 01/29/2022  ? Tobacco dependence 01/29/2022  ? History of pulmonary embolus 01/07/2022  ? Malignant hypertension 01/06/2022  ? Hypertensive urgency 01/06/2022  ?  Anemia of chronic kidney failure 12/28/2021  ? Chronic combined systolic and diastolic congestive heart failure ([place]) 12/27/2021  ? PNA (pneumonia) 12/15/2021  ? Benign essential HTN 06/08/2021  ? HFrEF (heart failure with reduced ejection fraction) ([place])   ? Encounter for antineoplastic chemotherapy 01/07/2021  ? ESRD on hemodialysis ([place]) 12/23/2020  ? Lupus nephritis ([place]) 12/21/2020  ? Hyperkalemia 11/29/2020  ? Pericardial  effusion-chronic    ? Pelvic adhesive disease 02/03/2019  ? Renal hypertension 02/02/2019  ? Non compliance w medication regimen 01/12/2019  ? Anemia 10/12/2018  ? 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 Supervision of pregnancy with grand multiparity in second trimester 09/20/2018  ? MDD (major depressive disorder) 10/11/2015  ? Asthma 09/22/2011  ? ?PCP:  Pcp, No ?Pharmacy:   ?[id number] - [place], [place] ?[place] ?[place] [address] ?Phone: [phone] Fax: [phone] ? ? ? ? ?Social Determinants of Health (SDOH) Interventions ?  ? ?Readmission Risk Interventions ? ?  03/18/2022  ?  9:17 AM 03/05/2022  ?  4:42 PM 03/03/2022  ?  1:58 PM  ?Readmission Risk Prevention Plan  ?Transportation Screening Complete Complete Complete  ?Medication Review Press photographer) Complete Complete Complete  ?PCP or Specialist appointment within 3-5 days of discharge  Complete   ?[place] or Home Care Consult Complete Complete Complete  ?SW Recovery Care/Counseling Consult Complete Complete Complete  ?Palliative Care Screening Not Applicable Not Applicable Not Applicable  ?[place] Not Applicable Not Applicable Not Applicable  ? ? ? ?

## 2022-03-18 NOTE — Progress Notes (Signed)
?   03/18/22 0835  ?Level of Consciousness  ?Level of Consciousness Alert  ?MEWS COLOR  ?MEWS Score Color Green  ?PCA/Epidural/Spinal Assessment  ?Respiratory Pattern Regular;Unlabored  ?MEWS Score  ?MEWS Temp 0  ?MEWS Systolic 0  ?MEWS Pulse 0  ?MEWS RR 0  ?MEWS LOC 0  ?MEWS Score 0  ?Provider Notification  ?Provider Name/Title dr Kathe Mariner  ?Date Provider Notified 03/18/22  ?Time Provider Notified [phone]  ?Method of Notification Face-to-face ?(during rounds)  ?Notification Reason Other (Comment) ?(elevated BP, asked if she would have HD treatment, states later today, go ahead and give ordered hydralazine now and hold other am meds until after HD treatment.)  ?Provider response At bedside  ?Date of Provider Response 03/18/22  ?Time of Provider Response [phone]  ? ? ?

## 2022-03-18 NOTE — ED Notes (Signed)
Pt states that abdominal pain is 10/10. MD notified  ?

## 2022-03-18 NOTE — Progress Notes (Addendum)
Initial Nutrition Assessment ? ?DOCUMENTATION CODES:  ? ?  ? ?INTERVENTION:  ?Ensure Enlive po BID  ? ?NUTRITION DIAGNOSIS:  ? ?Increased nutrient needs related to chronic illness (ESRD-HD) as evidenced by estimated needs. ? ? ?GOAL:  ?Patient will meet greater than or equal to 90% of their needs ? ? ?MONITOR:  ?PO intake, Labs, I & O's, Weight trends, Supplement acceptance ? ?REASON FOR ASSESSMENT:  ? ?Malnutrition Screening Tool ?  ? ?ASSESSMENT: Patient is a pleasant 41 yo with hx of Lupus, ESRD-HD, Chronic HF, HTN, MRSA bacteremia who presents with abdominal pain, ascites.  Per MD request made for patient to transfer to [facility] for inpatient rheumatology consult.  ? ?RD spoke with patient as she is receiving HD treatment. Goal: 4 liters to be removed and pt is tolerating well per dialysis nurse. Patient unable to provide EDW.  ? ?Meal intake- 75% breakfast. Patient "loves" eggs and enjoys veggies and also chicken and beef as sources of protein. Endorses good appetite. She likes Ensure over Nepro will change ONS.  ? ?Medications: Miralax and IV antibiotics.  ? ?Weight- 61.3 kg (this morning on standing scale). EDW-52 kg/ per nephrology. ?  ? ?  Latest Ref Rng & Units 03/18/2022  ?  3:58 AM 03/17/2022  ? 12:46 PM 03/05/2022  ?  5:24 AM  ?BMP  ?Glucose 70 - 99 mg/dL 75   83   91    ?BUN 6 - 20 mg/dL 43   32   30    ?Creatinine 0.44 - 1.00 mg/dL 8.60   7.49   7.23    ?Sodium 135 - 145 mmol/L 135   135   134    ?Potassium 3.5 - 5.1 mmol/L 4.2   3.9   4.2    ?Chloride 98 - 111 mmol/L 97   96   100    ?CO2 22 - 32 mmol/L 23   26   24     ?Calcium 8.9 - 10.3 mg/dL 8.4   8.8   8.0    ?  ? ?NUTRITION - FOCUSED PHYSICAL EXAM: ?Unable to complete Nutrition-Focused physical exam at this time.   ?  ? ?Diet Order:   ?Diet Order   ? ?       ?  Diet renal with fluid restriction Fluid restriction: 1200 mL Fluid; Room service appropriate? Yes; Fluid consistency: Thin  Diet effective now       ?  ? ?  ?  ? ?  ? ? ?EDUCATION NEEDS:  ?No  education needs have been identified at this time ? ?Skin:  Skin Assessment: Reviewed RN Assessment ? ?Last BM:  4/17 ? ?Height:  ? ?Ht Readings from Last 1 Encounters:  ?03/18/22 5\' 7"  (1.702 m)  ? ? ?Weight:  ? ?Wt Readings from Last 1 Encounters:  ?03/18/22 61.2 kg  ? ? ?Ideal Body Weight:   61 kg ? ?BMI:  Body mass index is 21.13 kg/m?. ? ?Estimated Nutritional Needs:  ? ?Kcal:  1800-1975 (based on EDW-52 kg) ? ?Protein:  68-72 gr ? ?Fluid:  1200 ml daily ? ?Colman Cater MS,RD,CSG,LDN ?Contact: AMION ?

## 2022-03-18 NOTE — ED Notes (Signed)
Pt states that pain level is 10/10. MD is aware ?

## 2022-03-18 NOTE — Progress Notes (Signed)
Patient reported that she had home medications with her during admission assessment. Discussed need to lock them in the pharmacy during admission or send home with family. Stated she would like them sent to pharmacy as her mother would be unable to come and get them today. One empty bottle of hydrocodone/acetaminophen, verified in front of patient that bottle was empty, witnessed by L. Chana Bode, LPN. Patient stated she was aware it was empty. Notified pharmacy on delivery.  ?

## 2022-03-18 NOTE — ED Notes (Signed)
Checked on pt. ?Pt not need to go to restroom at this time. ?RN notified Pt needs something for pain ?

## 2022-03-18 NOTE — Progress Notes (Signed)
removed 3557mls net fluid. pt states this is the max she can remove in one tx.  pre weight 61.5 bed scales.  edw 52kg post weight standing 57.5kg .  pt arrived very itching all over.  gave 50mg  iv benadryl.  provided some short term relief however pt scratching and itchy again within 20 to 30 minutes.  catheter ran well.  packed with heparin clamped and capped.  pre bp 164/109 post bp 160/100 ?

## 2022-03-18 NOTE — Assessment & Plan Note (Signed)
Without evidence of rash.  ?- Continue benadryl prn. Since taking po, will preferentially administer thru this route to reduce risk of oversedation with concomitant narcotic administration. ?

## 2022-03-18 NOTE — Progress Notes (Signed)
?Progress Note ? ?Patient: Cathy [name] QVZ:[id number] DOB: 1981/09/27  ?DOA: 03/17/2022  DOS: 03/18/2022  ?  ?Brief hospital course: ?HPI: Cathy [name] is a 41 y.o. female with medical history significant for SLE with pericarditis, systolic and diastolic CHF, nephritis with ESRD, hypertension, MRSA bacteremia, noncompliance. ?  ?Patient presented to the ED with complaints of generalized body pain that started after hemodialysis on Saturday.  She reports generalized cramps and spasms.  She reports it might be lupus pain, but she has never generalized body pains with her lupus in the past.  Pain is particularly worse in her abdominal area, and left flank area.  She reports intermittent difficulty breathing since HD on Saturday, with productive cough.  She reports feeling ill all over, with chills last night, nausea and dry heaving also.  Denies swelling of her joints. ?  ?Recent hospitalizations- 4/1 to 03/05/22 for volume overload, ascites requiring paracentesis, overall of 3 L of fluid. ?  ?3/1 to 02/08/2022 for MRSA bacteremia treated with IV vancomycin with hemodialysis, completed course. ?  ?ED Course: Temperature 98.  Heart rate 60s to 70s.  Respiratory rate 12-17.  Blood pressure systolic 329J to 188C.  O2 sat greater than 97% on room air. ?WBC 5.9.  Troponin 62 x 2.  ?Chest x-ray without acute abnormality ?CT abdomen and pelvis contrast shows mild to moderate ascites, small bilateral possibly loculated pleural effusions, right lower lobe airspace opacity concerning for pneumonia or atelectasis. ?  ?On talking to ED provider, initially concern for lupus flare, recommended patient would benefit from admission to tertiary care center with inpatient rheumatology.  Unfortunately [facility] is at capacity.  Hence hospitalist was reconsulted. ? ?Hospital Course: Patient admitted with empiric abx and nephrology consult for routine HD. Insufficient ascites for diagnostic paracentesis. CRP is only mildly elevated  at 2.4 with PCT 0.97. Complement levels are pending.  ? ?Assessment and Plan: ?* Abdominal pain ?Afebrile without leukocytosis, though abdominal exam is considerably tender. Insufficient ascites for paracentesis, though on empiric SBP coverage with ceftriaxone.  ?- Constipation certainly contributing. Will augment bowel regimen.  ?- No other emergent conditions noted on CT abd/pelvis.  ?- Supportive care with antiemetics and analgesics. Due to renal failure and unclear etiology of pain, will continue with dilaudid. She's not having emesis, so will give PO in lieu of IV. ? ?Chronic combined systolic and diastolic congestive heart failure ([place]) ? Last echo was TEE 01/2022, EF of 35 to 40%. Compensated with volume managed by dialysis.  ? ?Pruritus ?Without evidence of rash.  ?- Continue benadryl prn. Since taking po, will preferentially administer thru this route to reduce risk of oversedation with concomitant narcotic administration. ? ?History of pulmonary embolus ?- Continue eliquis ? ?PNA (pneumonia) ?Treating as RLL pneumonia. She reports dyspnea, cough, fatigue, chills.  She is afebrile without leukocytosis.  Rules out for sepsis.  CT abdomen and pelvis showing atelectasis versus pneumonia, small bilateral and possibly loculated pleural effusions. ?-Continue ceftriaxone and azithromycin.  ?- If worsens, would add MRSA coverage given her previous infection (albeit completed treatment) ? ?Benign essential HTN ?Uncontrolled. ?- Resumed hydralazine, carvedilol, norvasc, clonidine, losartan. ? ?ESRD on hemodialysis ([place]) ?- Nephrology consulted, routine HD performed 4/18 (TTS schedule) ? ?Systemic lupus erythematosus (SLE) with pericarditis ([place]) ?Reports generalized body pain though no rash or arthritis/joint swelling noted.  ?- ESR is 38, CRP 2.4. Reports she's not been taking hydroxychloroquine.  ?- Complement is pending. If low or objective evidence of lupus flare presents, would reinitiate  transfer request to  [facility] for rheumatology evaluation. ? ?Subjective: Pain is "all over" including abdomen, all extremities, not localized to joints or muscle groups in particular. No changes in vision reported, denies rash. No chest pain reported.  ? ?Objective: ?Vitals:  ? 03/18/22 1430 03/18/22 1500 03/18/22 1520 03/18/22 1555  ?BP: (!) 160/99 (!) 170/109 (!) 158/105 (!) 165/105  ?Pulse: 69 76 73 79  ?Resp: 18 20 20 18   ?Temp:   98.3 ?F (36.8 ?C) 98.4 ?F (36.9 ?C)  ?TempSrc:    Oral  ?SpO2:    98%  ?Weight:   57.5 kg   ?Height:      ? ?Gen: Chronically ill-appearing 41 y.o. female in no distress ?Pulm: Nonlabored breathing room air. Diminished at bases. ?CV: Regular rate and rhythm. No murmur, rub, or gallop. No JVD, no pitting dependent edema. ?GI: Abdomen is protuberant and diffusely tender to very light palpation. No rebound. No palpable mass. +BS. ?Ext: Warm, no deformities ?Skin: No rashes, lesions or ulcers on visualized skin. ?Neuro: Alert and oriented. No focal neurological deficits. ?Psych: Judgement and insight appear fair. Mood euthymic & affect congruent. Behavior is appropriate.   ? ?Data Personally reviewed: ? ?CBC: ?Recent Labs  ?Lab 03/17/22 ?1246 03/18/22 ?0358 03/18/22 ?1045  ?WBC 5.9 7.0 6.3  ?NEUTROABS 4.0  --   --   ?HGB 11.0* 11.2* 11.1*  ?HCT 35.2* 37.0 36.1  ?MCV 89.8 90.0 89.6  ?PLT 216 261 273  ? ?Basic Metabolic Panel: ?Recent Labs  ?Lab 03/17/22 ?1246 03/18/22 ?0358  ?NA 135 135  ?K 3.9 4.2  ?CL 96* 97*  ?CO2 26 23  ?GLUCOSE 83 75  ?BUN 32* 43*  ?CREATININE 7.49* 8.60*  ?CALCIUM 8.8* 8.4*  ? ?GFR: ?Estimated Creatinine Clearance: 8.1 mL/min (A) (by C-G formula based on SCr of 8.6 mg/dL (H)). ?Liver Function Tests: ?Recent Labs  ?Lab 03/17/22 ?1246  ?AST 29  ?ALT 18  ?ALKPHOS 278*  ?BILITOT 0.8  ?PROT 6.9  ?ALBUMIN 3.2*  ? ?Recent Labs  ?Lab 03/17/22 ?1253  ?LIPASE 59*  ? ?No results for input(s): AMMONIA in the last 168 hours. ?Coagulation Profile: ?No results for input(s): INR, PROTIME in the last 168  hours. ?Cardiac Enzymes: ?No results for input(s): CKTOTAL, CKMB, CKMBINDEX, TROPONINI in the last 168 hours. ?BNP (last 3 results) ?No results for input(s): PROBNP in the last 8760 hours. ?HbA1C: ?No results for input(s): HGBA1C in the last 72 hours. ?CBG: ?Recent Labs  ?Lab 03/18/22 ?0115  ?GLUCAP 82  ? ?Lipid Profile: ?No results for input(s): CHOL, HDL, LDLCALC, TRIG, CHOLHDL, LDLDIRECT in the last 72 hours. ?Thyroid Function Tests: ?No results for input(s): TSH, T4TOTAL, FREET4, T3FREE, THYROIDAB in the last 72 hours. ?Anemia Panel: ?No results for input(s): VITAMINB12, FOLATE, FERRITIN, TIBC, IRON, RETICCTPCT in the last 72 hours. ?Urine analysis: ?   ?Component Value Date/Time  ? [place] 11/29/2020 2054  ? APPEARANCEUR HAZY (A) 11/29/2020 2054  ? LABSPEC 1.015 11/29/2020 2054  ? PHURINE 6.0 11/29/2020 2054  ? [place] NEGATIVE 11/29/2020 2054  ? HGBUR MODERATE (A) 11/29/2020 2054  ? [place] NEGATIVE 11/29/2020 2054  ? [place] NEGATIVE 11/29/2020 2054  ? PROTEINUR >=300 (A) 11/29/2020 2054  ? UROBILINOGEN 0.2 10/17/2020 1026  ? NITRITE NEGATIVE 11/29/2020 2054  ? LEUKOCYTESUR NEGATIVE 11/29/2020 2054  ? ?Recent Results (from the past 240 hour(s))  ?Culture, blood (Routine X 2) w Reflex to ID Panel     Status: None (Preliminary result)  ? Collection Time: 03/18/22 10:36 AM  ?  Specimen: Left Antecubital; Blood  ?Result Value Ref Range Status  ? Specimen Description LEFT ANTECUBITAL  Final  ? Special Requests   Final  ?  BOTTLES DRAWN AEROBIC AND ANAEROBIC Blood Culture adequate volume ?Performed at [facility], [address], [place], [place] [address] ?  ? Culture PENDING  Incomplete  ? Report Status PENDING  Incomplete  ?Culture, blood (Routine X 2) w Reflex to ID Panel     Status: None (Preliminary result)  ? Collection Time: 03/18/22 10:38 AM  ? Specimen: BLOOD RIGHT HAND  ?Result Value Ref Range Status  ? Specimen Description BLOOD RIGHT HAND  Final  ? Special Requests   Final  ?  BOTTLES  DRAWN AEROBIC AND ANAEROBIC Blood Culture results may not be optimal due to an excessive volume of blood received in culture bottles ?Performed at [facility], [address], [place],  [address] ?  ?

## 2022-03-19 DIAGNOSIS — J189 Pneumonia, unspecified organism: Secondary | ICD-10-CM | POA: Diagnosis not present

## 2022-03-19 LAB — C4 COMPLEMENT: Complement C4, Body Fluid: 20 mg/dL (ref 12–38)

## 2022-03-19 LAB — HEPATITIS B SURFACE ANTIBODY, QUANTITATIVE: Hep B S AB Quant (Post): 45.5 m[IU]/mL (ref >9.9)

## 2022-03-19 LAB — C3 COMPLEMENT: C3 Complement: 104 mg/dL (ref 82–167)

## 2022-03-19 LAB — ANTI-DNA ANTIBODY, DOUBLE-STRANDED: ds DNA Ab: 1 IU/mL (ref 0–9)

## 2022-03-19 MED ORDER — KETOROLAC TROMETHAMINE 15 MG/ML IJ SOLN
15.0000 mg | Freq: Four times a day (QID) | INTRAMUSCULAR | Status: DC | PRN
  Administered 2022-03-19 – 2022-03-21 (×5): 15 mg via INTRAVENOUS
  Filled 2022-03-19 (×6): qty 1

## 2022-03-19 MED ORDER — METHOCARBAMOL 1000 MG/10ML IJ SOLN
500.0000 mg | Freq: Three times a day (TID) | INTRAVENOUS | Status: DC | PRN
  Administered 2022-03-19 – 2022-03-20 (×2): 500 mg via INTRAVENOUS
  Filled 2022-03-19: qty 5

## 2022-03-19 MED ORDER — METHOCARBAMOL 1000 MG/10ML IJ SOLN
INTRAMUSCULAR | Status: AC
  Filled 2022-03-19: qty 10

## 2022-03-19 NOTE — Progress Notes (Signed)
Patient ID: Cathy [name], female   DOB: 1981-07-19, 41 y.o.   MRN: [id number] ?S: Feeling better but still having left sided low back pain. ?O:BP (!) 172/115 (BP Location: Left Arm)   Pulse 86   Temp 98 ?F (36.7 ?C)   Resp 19   Ht _0  (1.702 m)   Wt 57.5 kg   SpO2 100%   BMI 19.85 kg/m?  ? ?Intake/Output Summary (Last 24 hours) at 03/19/2022 0941 ?Last data filed at 03/19/2022 8657 ?Gross per 24 hour  ?Intake 942.04 ml  ?Output 3500 ml  ?Net -2557.96 ml  ? ?Intake/Output: ?I/O last 3 completed shifts: ?In: 1182 [P.O.:480; IV Piggyback:702] ?Out: 3500 [Other:3500] ? Intake/Output this shift: ? No intake/output data recorded. ?Weight change: 6.87 kg ?Gen:NAD ?CVS: RRR ?Resp:CTA ?Abd: +BS, soft, mildly distended, nontender, no guarding or rebound ?Ext: no edema ? ?Recent Labs  ?Lab 03/17/22 ?1246 03/18/22 ?0358  ?NA 135 135  ?K 3.9 4.2  ?CL 96* 97*  ?CO2 26 23  ?GLUCOSE 83 75  ?BUN 32* 43*  ?CREATININE 7.49* 8.60*  ?ALBUMIN 3.2*  --   ?CALCIUM 8.8* 8.4*  ?AST 29  --   ?ALT 18  --   ? ?Liver Function Tests: ?Recent Labs  ?Lab 03/17/22 ?1246  ?AST 29  ?ALT 18  ?ALKPHOS 278*  ?BILITOT 0.8  ?PROT 6.9  ?ALBUMIN 3.2*  ? ?Recent Labs  ?Lab 03/17/22 ?1253  ?LIPASE 59*  ? ?No results for input(s): AMMONIA in the last 168 hours. ?CBC: ?Recent Labs  ?Lab 03/17/22 ?1246 03/18/22 ?0358 03/18/22 ?1045  ?WBC 5.9 7.0 6.3  ?NEUTROABS 4.0  --   --   ?HGB 11.0* 11.2* 11.1*  ?HCT 35.2* 37.0 36.1  ?MCV 89.8 90.0 89.6  ?PLT 216 261 273  ? ?Cardiac Enzymes: ?No results for input(s): CKTOTAL, CKMB, CKMBINDEX, TROPONINI in the last 168 hours. ?CBG: ?Recent Labs  ?Lab 03/18/22 ?0115  ?GLUCAP 82  ? ? ?Iron Studies: No results for input(s): IRON, TIBC, TRANSFERRIN, FERRITIN in the last 72 hours. ?Studies/Results: ?CT ABDOMEN PELVIS WO CONTRAST ? ?Result Date: 03/17/2022 ?CLINICAL DATA:  Acute left-sided abdominal pain. EXAM: CT ABDOMEN AND PELVIS WITHOUT CONTRAST TECHNIQUE: Multidetector CT imaging of the abdomen and pelvis was performed  following the standard protocol without IV contrast. RADIATION DOSE REDUCTION: This exam was performed according to the departmental dose-optimization program which includes automated exposure control, adjustment of the mA and/or kV according to patient size and/or use of iterative reconstruction technique. COMPARISON:  March 01, 2022. FINDINGS: Lower chest: Small possibly loculated pleural effusions are noted. Right lower lobe airspace opacity is noted concerning for pneumonia or atelectasis. Hepatobiliary: No focal liver abnormality is seen. No gallstones, gallbladder wall thickening, or biliary dilatation. Pancreas: Unremarkable. No pancreatic ductal dilatation or surrounding inflammatory changes. Spleen: Normal in size without focal abnormality. Adrenals/Urinary Tract: Adrenal glands are unremarkable. Kidneys are normal, without renal calculi, focal lesion, or hydronephrosis. Bladder is unremarkable. Stomach/Bowel: Stomach is within normal limits. Appendix appears normal. No evidence of bowel wall thickening, distention, or inflammatory changes. Stool is noted throughout the colon. Vascular/Lymphatic: No significant vascular findings are present. No enlarged abdominal or pelvic lymph nodes. Reproductive: Uterus and ovaries are unremarkable. Bilateral fallopian tube ligation clips are noted. Other: Mild to moderate ascites is noted in the pelvis and in the perisplenic and perihepatic regions. No hernia is noted. Musculoskeletal: No acute or significant osseous findings. IMPRESSION: Mild to moderate ascites is noted. Small bilateral and possibly loculated pleural effusions are noted.  Right lower lobe airspace opacity is noted concerning for pneumonia or atelectasis. Stool is noted throughout the colon. Electronically Signed   By: Marijo Conception M.D.   On: 03/17/2022 16:18  ? ?DG Chest 2 View ? ?Result Date: 03/17/2022 ?CLINICAL DATA:  Shortness of breath, chest pain. EXAM: CHEST - 2 VIEW COMPARISON:  March 01, 2022.  FINDINGS: Stable cardiomegaly. Right internal jugular dialysis catheter is unchanged in position. Bibasilar atelectasis is noted with small bilateral pleural effusions. Bony thorax is unremarkable. IMPRESSION: Mild bibasilar subsegmental atelectasis is noted with small bilateral pleural effusions. Electronically Signed   By: Marijo Conception M.D.   On: 03/17/2022 16:29  ? ?[place] ASCITES (ABDOMEN LIMITED) ? ?Result Date: 03/18/2022 ?CLINICAL DATA:  Evaluate for possible paracentesis EXAM: LIMITED ABDOMEN ULTRASOUND FOR ASCITES TECHNIQUE: Limited ultrasound survey for ascites was performed in all four abdominal quadrants. COMPARISON:  CT of 1 day prior FINDINGS: Small volume ascites identified within the imaged abdomen and pelvis. Insufficient for paracentesis. IMPRESSION: Small volume abdominal ascites.  Felt insufficient for paracentesis. Electronically Signed   By: Abigail Miyamoto M.D.   On: 03/18/2022 09:46   ? amLODipine  10 mg Oral Daily  ? apixaban  5 mg Oral BID  ? carvedilol  25 mg Oral BID WC  ? Chlorhexidine Gluconate Cloth  6 each Topical Daily  ? Chlorhexidine Gluconate Cloth  6 each Topical Q0600  ? cloNIDine  0.2 mg Oral Q8H  ? feeding supplement  237 mL Oral BID BM  ? hydrALAZINE  100 mg Oral TID  ? losartan  100 mg Oral QHS  ? polyethylene glycol  17 g Oral BID  ? senna-docusate  2 tablet Oral BID  ? ? ?BMET ?   ?Component Value Date/Time  ? NA 135 03/18/2022 0358  ? NA 136 12/10/2018 1142  ? K 4.2 03/18/2022 0358  ? CL 97 (L) 03/18/2022 0358  ? CO2 23 03/18/2022 0358  ? GLUCOSE 75 03/18/2022 0358  ? BUN 43 (H) 03/18/2022 0358  ? BUN 9 12/10/2018 1142  ? CREATININE 8.60 (H) 03/18/2022 0358  ? CALCIUM 8.4 (L) 03/18/2022 0358  ? GFRNONAA 6 (L) 03/18/2022 0358  ? GFRAA >60 08/07/2020 1209  ? ?CBC ?   ?Component Value Date/Time  ? WBC 6.3 03/18/2022 1045  ? RBC 4.03 03/18/2022 1045  ? HGB 11.1 (L) 03/18/2022 1045  ? HGB 6.6 (L) 12/04/2020 1158  ? HCT 36.1 03/18/2022 1045  ? HCT 21.0 (L) 01/12/2019 0904  ? PLT 273  03/18/2022 1045  ? PLT 228 01/12/2019 0904  ? MCV 89.6 03/18/2022 1045  ? MCV 92 01/12/2019 0904  ? MCH 27.5 03/18/2022 1045  ? MCHC 30.7 03/18/2022 1045  ? RDW 18.1 (H) 03/18/2022 1045  ? RDW 13.9 01/12/2019 0904  ? LYMPHSABS 0.9 03/17/2022 1246  ? LYMPHSABS 0.7 10/04/2018 1131  ? MONOABS 0.8 03/17/2022 1246  ? EOSABS 0.1 03/17/2022 1246  ? EOSABS 0.0 10/04/2018 1131  ? BASOSABS 0.0 03/17/2022 1246  ? BASOSABS 0.0 10/04/2018 1131  ? ? ?Dialysis Orders: Center: [facility]  on TTS . ?EDW 52 kg HD Bath 2K/2Ca  Time 4 hours Heparin none. Access RIJ TDC BFR 400 DFR 800    ?Hectoral 4 mcg IVP TIW,  ?  ?Assessment/Plan: ? Abdominal pain - mainly in lower abdomen and back.  CT scan with moderate ascites, had similar presentation earlier this month and paracentesis was negative for SBP.  [place] with small amount of peritoneal fluid and not enough  for paracentesis. Started on iv ceftriaxone per primary ? Left lower back pain - no osseous findings.  May be muscle spasms.  Consider muscle relaxer but avoid baclofen in ESRD patients.   ? ESRD -  Will plan to keep on TTS schedule ? Hypertension/volume  - BP high despite multiple medications and UF with HD. She is still above edw and will UF as tolerated with HD tomorrow.  May need to add clonidine. ? Anemia  - no esa ? Metabolic bone disease -   continue with home meds ? Nutrition - renal diet ? Diffuse arthralgias and myalgias - concerning for lupus flare, however low ESR and CRP with normal complements.  Less likely.   ? RLL pneumonia - on IV ceftriaxone and azithromycin.  Also started on Vancomycin given recent MRSA bacteremia.  Need to recheck blood cultures. ? SLE with pericarditis - Will order serologies as above. ? 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  Blood cultures pending. ? Disposition - per primary svc. ? ?Donetta Potts, MD ?[facility] ? ? ?

## 2022-03-19 NOTE — Plan of Care (Signed)

## 2022-03-19 NOTE — Progress Notes (Signed)
?PROGRESS NOTE ? ? ? ?Cathy [name]  TLX:[id number] DOB: [birth year] DOA: 03/17/2022 ?PCP: Pcp, No  ? ?  ?Brief Narrative:  ?Cathy [name] is a 41 y.o. female with medical history significant for SLE with pericarditis, systolic and diastolic CHF, nephritis with ESRD, hypertension, MRSA bacteremia, noncompliance. ?  ?Patient presented to the ED with complaints of generalized body pain that started after hemodialysis on Saturday.  She reports generalized cramps and spasms.  She reports it might be lupus pain, but she has never generalized body pains with her lupus in the past.  Pain is particularly worse in her abdominal area, and left flank area.  She reports intermittent difficulty breathing since HD on Saturday, with productive cough.  She reports feeling ill all over, with chills last night, nausea and dry heaving also.  Denies swelling of her joints. ? ?Recent hospitalizations: ?4/1 to 03/05/22 for volume overload, ascites requiring paracentesis, overall of 3 L of fluid. ?  ?3/1 to 02/08/2022 for MRSA bacteremia treated with IV vancomycin with hemodialysis, completed course. ? ?There was initial concern for lupus flare with pain.  Case was discussed with [facility], but they were at capacity so patient was admitted to [facility]. ? ?New events last 24 hours / Subjective: ?Continues to have some low back pain, states this is where her pain is the worst.  Thinks maybe it is musculoskeletal. ? ?Assessment & Plan: ?  ? ?Principal Problem: ?  CAP (community acquired pneumonia) ?Active Problems: ?  Chronic combined systolic and diastolic congestive heart failure ([place]) ?  MDD (major depressive disorder) ?  Systemic lupus erythematosus (SLE) with pericarditis ([place]) ?  Non compliance w medication regimen ?  Lupus nephritis ([place]) ?  ESRD on hemodialysis ([place]) ?  Benign essential HTN ?  History of pulmonary embolus ?  Ascites ?  Abdominal pain ?  Pruritus ? ? ?Right-sided CAP ?-Sepsis ruled out ?-Continue  Rocephin, azithromycin ? ?Diffuse arthralgias, myalgia - not likely lupus flare ?History of lupus ?-ESR 38, CRP 2.4, C4 20, C3 104 ?-No longer taking hydroxychloroquine ?-Pain management, trial robaxin  ? ?Abdominal pain ?-CT abdomen pelvis largely unremarkable  ?-Insufficient ascites for paracentesis ?-Continue bowel regimen for constipation ? ?Hypertension ?-Hydralazine, Coreg, Norvasc, clonidine, losartan ? ?ESRD ?-Nephrology following for dialysis TTS ? ?Chronic combined systolic and diastolic heart failure ?-Volume managed by dialysis ? ?History of PE ?-Eliquis ? ? ?DVT prophylaxis:  ?SCDs Start: 03/17/22 2152 ?apixaban (ELIQUIS) tablet 5 mg  ?Code Status: Full ?Family Communication: None at bedside ?Disposition Plan:  ?Status is: Inpatient ?Remains inpatient appropriate because: IV antibiotics, pain management  ? ?Consultants:  ?Nephrology ? ?Procedures:  ?None  ? ?Antimicrobials:  ?Anti-infectives (From admission, onward)  ? ? Start     Dose/Rate Route Frequency Ordered Stop  ? 03/17/22 2200  cefTRIAXone (ROCEPHIN) 2 g in sodium chloride 0.9 % 100 mL IVPB       ? 2 g ?200 mL/hr over 30 Minutes Intravenous Every 24 hours 03/17/22 2148 03/22/22 2159  ? 03/17/22 2200  azithromycin (ZITHROMAX) 500 mg in sodium chloride 0.9 % 250 mL IVPB       ? 500 mg ?250 mL/hr over 60 Minutes Intravenous Every 24 hours 03/17/22 2148 03/22/22 2159  ? ?  ? ? ? ?Objective: ?Vitals:  ? 03/18/22 1520 03/18/22 1555 03/18/22 2010 03/19/22 0511  ?BP: (!) 158/105 (!) 165/105 (!) 155/104 (!) 172/115  ?Pulse: 73 79 84 86  ?Resp: 20 18 19 19   ?Temp: 98.3 ?  F (36.8 ?C) 98.4 ?F (36.9 ?C) 98 ?F (36.7 ?C) 98 ?F (36.7 ?C)  ?TempSrc:  Oral    ?SpO2:  98% 100% 100%  ?Weight: 57.5 kg     ?Height:      ? ? ?Intake/Output Summary (Last 24 hours) at 03/19/2022 1241 ?Last data filed at 03/19/2022 1324 ?Gross per 24 hour  ?Intake 942.04 ml  ?Output 3500 ml  ?Net -2557.96 ml  ? ?Filed Weights  ? 03/18/22 0900 03/18/22 1109 03/18/22 1520  ?Weight: 61.3 kg  61.2 kg 57.5 kg  ? ? ?Examination:  ?General exam: Appears calm and comfortable  ?Respiratory system: Clear to auscultation. Respiratory effort normal. No respiratory distress. No conversational dyspnea. On room air  ?Cardiovascular system: S1 & S2 heard, RRR. No murmurs. No pedal edema. ?Gastrointestinal system: Abdomen is mildly distended, soft ?Central nervous system: Alert and oriented. No focal neurological deficits. Speech clear.  ?Extremities: Symmetric in appearance  ?Skin: No rashes, lesions or ulcers on exposed skin  ?Psychiatry: Judgement and insight appear normal. Mood & affect appropriate.  ? ?Data Reviewed: I have personally reviewed following labs and imaging studies ? ?CBC: ?Recent Labs  ?Lab 03/17/22 ?1246 03/18/22 ?0358 03/18/22 ?1045  ?WBC 5.9 7.0 6.3  ?NEUTROABS 4.0  --   --   ?HGB 11.0* 11.2* 11.1*  ?HCT 35.2* 37.0 36.1  ?MCV 89.8 90.0 89.6  ?PLT 216 261 273  ? ?Basic Metabolic Panel: ?Recent Labs  ?Lab 03/17/22 ?1246 03/18/22 ?0358  ?NA 135 135  ?K 3.9 4.2  ?CL 96* 97*  ?CO2 26 23  ?GLUCOSE 83 75  ?BUN 32* 43*  ?CREATININE 7.49* 8.60*  ?CALCIUM 8.8* 8.4*  ? ?GFR: ?Estimated Creatinine Clearance: 8.1 mL/min (A) (by C-G formula based on SCr of 8.6 mg/dL (H)). ?Liver Function Tests: ?Recent Labs  ?Lab 03/17/22 ?1246  ?AST 29  ?ALT 18  ?ALKPHOS 278*  ?BILITOT 0.8  ?PROT 6.9  ?ALBUMIN 3.2*  ? ?Recent Labs  ?Lab 03/17/22 ?1253  ?LIPASE 59*  ? ?No results for input(s): AMMONIA in the last 168 hours. ?Coagulation Profile: ?No results for input(s): INR, PROTIME in the last 168 hours. ?Cardiac Enzymes: ?No results for input(s): CKTOTAL, CKMB, CKMBINDEX, TROPONINI in the last 168 hours. ?BNP (last 3 results) ?No results for input(s): PROBNP in the last 8760 hours. ?HbA1C: ?No results for input(s): HGBA1C in the last 72 hours. ?CBG: ?Recent Labs  ?Lab 03/18/22 ?0115  ?GLUCAP 82  ? ?Lipid Profile: ?No results for input(s): CHOL, HDL, LDLCALC, TRIG, CHOLHDL, LDLDIRECT in the last 72 hours. ?Thyroid Function  Tests: ?No results for input(s): TSH, T4TOTAL, FREET4, T3FREE, THYROIDAB in the last 72 hours. ?Anemia Panel: ?No results for input(s): VITAMINB12, FOLATE, FERRITIN, TIBC, IRON, RETICCTPCT in the last 72 hours. ?Sepsis Labs: ?Recent Labs  ?Lab 03/17/22 ?1718  ?PROCALCITON 0.97  ? ? ?Recent Results (from the past 240 hour(s))  ?Culture, blood (Routine X 2) w Reflex to ID Panel     Status: None (Preliminary result)  ? Collection Time: 03/18/22 10:36 AM  ? Specimen: Left Antecubital; Blood  ?Result Value Ref Range Status  ? Specimen Description LEFT ANTECUBITAL  Final  ? Special Requests   Final  ?  BOTTLES DRAWN AEROBIC AND ANAEROBIC Blood Culture adequate volume  ? Culture   Final  ?  NO GROWTH < 24 HOURS ?Performed at [facility], [address] [place], [place] [address] ?  ? Report Status PENDING  Incomplete  ?Culture, blood (Routine X 2) w Reflex to ID  Panel     Status: None (Preliminary result)  ? Collection Time: 03/18/22 10:38 AM  ? Specimen: BLOOD RIGHT HAND  ?Result Value Ref Range Status  ? Specimen Description BLOOD RIGHT HAND  Final  ? Special Requests   Final  ?  BOTTLES DRAWN AEROBIC AND ANAEROBIC Blood Culture results may not be optimal due to an excessive volume of blood received in culture bottles  ? Culture   Final  ?  NO GROWTH < 24 HOURS ?Performed at [facility], [address], [place], [place] [address] ?  ? Report Status PENDING  Incomplete  ?  ? ? ?Radiology Studies: ?CT ABDOMEN PELVIS WO CONTRAST ? ?Result Date: 03/17/2022 ?CLINICAL DATA:  Acute left-sided abdominal pain. EXAM: CT ABDOMEN AND PELVIS WITHOUT CONTRAST TECHNIQUE: Multidetector CT imaging of the abdomen and pelvis was performed following the standard protocol without IV contrast. RADIATION DOSE REDUCTION: This exam was performed according to the departmental dose-optimization program which includes automated exposure control, adjustment of the mA and/or kV according to patient size and/or use of iterative reconstruction  technique. COMPARISON:  March 01, 2022. FINDINGS: Lower chest: Small possibly loculated pleural effusions are noted. Right lower lobe airspace opacity is noted concerning for pneumonia or atelectasis. Hepatobiliary: N

## 2022-03-20 DIAGNOSIS — J189 Pneumonia, unspecified organism: Secondary | ICD-10-CM | POA: Diagnosis not present

## 2022-03-20 LAB — BASIC METABOLIC PANEL
Anion gap: 12 (ref 5–15)
BUN: 32 mg/dL — ABNORMAL HIGH (ref 6–20)
CO2: 24 mmol/L (ref 22–32)
Calcium: 8.2 mg/dL — ABNORMAL LOW (ref 8.9–10.3)
Chloride: 100 mmol/L (ref 98–111)
Creatinine, Ser: 7.72 mg/dL — ABNORMAL HIGH (ref 0.44–1.00)
GFR, Estimated: 6 mL/min — ABNORMAL LOW (ref >60)
Glucose, Bld: 92 mg/dL (ref 70–99)
Potassium: 3.3 mmol/L — ABNORMAL LOW (ref 3.5–5.1)
Sodium: 136 mmol/L (ref 135–145)

## 2022-03-20 LAB — COMPLEMENT, TOTAL: Compl, Total (CH50): 60 U/mL (ref >41)

## 2022-03-20 MED ORDER — AZITHROMYCIN 250 MG PO TABS
500.0000 mg | ORAL_TABLET | Freq: Every day | ORAL | Status: DC
  Administered 2022-03-20: 500 mg via ORAL
  Filled 2022-03-20: qty 2

## 2022-03-20 MED ORDER — HYDROMORPHONE HCL 2 MG PO TABS
2.0000 mg | ORAL_TABLET | ORAL | Status: DC | PRN
  Administered 2022-03-20 (×2): 4 mg via ORAL
  Administered 2022-03-21: 2 mg via ORAL
  Filled 2022-03-20: qty 2
  Filled 2022-03-20: qty 1
  Filled 2022-03-20: qty 2

## 2022-03-20 MED ORDER — HYDRALAZINE HCL 20 MG/ML IJ SOLN
INTRAMUSCULAR | Status: AC
  Filled 2022-03-20: qty 1

## 2022-03-20 MED ORDER — DIPHENHYDRAMINE HCL 50 MG/ML IJ SOLN
25.0000 mg | Freq: Once | INTRAMUSCULAR | Status: AC
  Administered 2022-03-20: 25 mg via INTRAVENOUS

## 2022-03-20 MED ORDER — DIPHENHYDRAMINE HCL 50 MG/ML IJ SOLN
INTRAMUSCULAR | Status: AC
  Filled 2022-03-20: qty 1

## 2022-03-20 NOTE — Progress Notes (Signed)
Patient ID: Cathy [name], female   DOB: June 11, 1981, 41 y.o.   MRN: [id number] ?S:Feeling better this morning but reports constipation. ?O:BP (!) 143/99 (BP Location: Right Arm)   Pulse 61   Temp 98.6 ?F (37 ?C)   Resp 18   Ht _0  (1.702 m)   Wt 57.5 kg   SpO2 97%   BMI 19.85 kg/m?  ? ?Intake/Output Summary (Last 24 hours) at 03/20/2022 1006 ?Last data filed at 03/20/2022 0900 ?Gross per 24 hour  ?Intake 290 ml  ?Output --  ?Net 290 ml  ? ?Intake/Output: ?I/O last 3 completed shifts: ?In: 400 [IV [id number]] ?Out: -  ? Intake/Output this shift: ? Total I/O ?In: 240 [P.O.:240] ?Out: -  ?Weight change:  ?Gen:NAD ?CVS: RRR ?Resp:CTA ?Abd: +BS, soft, mildly distended, mildly tender ?Ext: no edema ? ?Recent Labs  ?Lab 03/17/22 ?1246 03/18/22 ?0358 03/20/22 ?0451  ?NA 135 135 136  ?K 3.9 4.2 3.3*  ?CL 96* 97* 100  ?CO2 _1 ?GLUCOSE 83 75 92  ?BUN 32* 43* 32*  ?CREATININE 7.49* 8.60* 7.72*  ?ALBUMIN 3.2*  --   --   ?CALCIUM 8.8* 8.4* 8.2*  ?AST 29  --   --   ?ALT 18  --   --   ? ?Liver Function Tests: ?Recent Labs  ?Lab 03/17/22 ?1246  ?AST 29  ?ALT 18  ?ALKPHOS 278*  ?BILITOT 0.8  ?PROT 6.9  ?ALBUMIN 3.2*  ? ?Recent Labs  ?Lab 03/17/22 ?1253  ?LIPASE 59*  ? ?No results for input(s): AMMONIA in the last 168 hours. ?CBC: ?Recent Labs  ?Lab 03/17/22 ?1246 03/18/22 ?0358 03/18/22 ?1045  ?WBC 5.9 7.0 6.3  ?NEUTROABS 4.0  --   --   ?HGB 11.0* 11.2* 11.1*  ?HCT 35.2* 37.0 36.1  ?MCV 89.8 90.0 89.6  ?PLT 216 261 273  ? ?Cardiac Enzymes: ?No results for input(s): CKTOTAL, CKMB, CKMBINDEX, TROPONINI in the last 168 hours. ?CBG: ?Recent Labs  ?Lab 03/18/22 ?0115  ?GLUCAP 82  ? ? ?Iron Studies: No results for input(s): IRON, TIBC, TRANSFERRIN, FERRITIN in the last 72 hours. ?Studies/Results: ?No results found. ? amLODipine  10 mg Oral Daily  ? apixaban  5 mg Oral BID  ? carvedilol  25 mg Oral BID WC  ? Chlorhexidine Gluconate Cloth  6 each Topical Daily  ? Chlorhexidine Gluconate Cloth  6 each Topical Q0600  ?  cloNIDine  0.2 mg Oral Q8H  ? feeding supplement  237 mL Oral BID BM  ? hydrALAZINE  100 mg Oral TID  ? losartan  100 mg Oral QHS  ? senna-docusate  2 tablet Oral BID  ? ? ?BMET ?   ?Component Value Date/Time  ? NA 136 03/20/2022 0451  ? NA 136 12/10/2018 1142  ? K 3.3 (L) 03/20/2022 0451  ? CL 100 03/20/2022 0451  ? CO2 24 03/20/2022 0451  ? GLUCOSE 92 03/20/2022 0451  ? BUN 32 (H) 03/20/2022 0451  ? BUN 9 12/10/2018 1142  ? CREATININE 7.72 (H) 03/20/2022 0451  ? CALCIUM 8.2 (L) 03/20/2022 0451  ? GFRNONAA 6 (L) 03/20/2022 0451  ? GFRAA >60 08/07/2020 1209  ? ?CBC ?   ?Component Value Date/Time  ? WBC 6.3 03/18/2022 1045  ? RBC 4.03 03/18/2022 1045  ? HGB 11.1 (L) 03/18/2022 1045  ? HGB 6.6 (L) 12/04/2020 1158  ? HCT 36.1 03/18/2022 1045  ? HCT 21.0 (L) 01/12/2019 0904  ? PLT 273 03/18/2022 1045  ? PLT 228 01/12/2019 0904  ?  MCV 89.6 03/18/2022 1045  ? MCV 92 01/12/2019 0904  ? MCH 27.5 03/18/2022 1045  ? MCHC 30.7 03/18/2022 1045  ? RDW 18.1 (H) 03/18/2022 1045  ? RDW 13.9 01/12/2019 0904  ? LYMPHSABS 0.9 03/17/2022 1246  ? LYMPHSABS 0.7 10/04/2018 1131  ? MONOABS 0.8 03/17/2022 1246  ? EOSABS 0.1 03/17/2022 1246  ? EOSABS 0.0 10/04/2018 1131  ? BASOSABS 0.0 03/17/2022 1246  ? BASOSABS 0.0 10/04/2018 1131  ? ? ?Dialysis Orders: Center: [facility]  on TTS . ?EDW 52 kg HD Bath 2K/2Ca  Time 4 hours Heparin none. Access RIJ TDC BFR 400 DFR 800    ?Hectoral 4 mcg IVP TIW,  ?  ?Assessment/Plan: ? Abdominal pain - mainly in lower abdomen and back.  CT scan with moderate ascites, had similar presentation earlier this month and paracentesis was negative for SBP.  [place] with small amount of peritoneal fluid and not enough for paracentesis. Started on iv ceftriaxone per primary ? Left lower back pain - no osseous findings.  May be muscle spasms and have responded to robaxin.  Avoid baclofen in ESRD patients.   ? ESRD -  Will plan to keep on TTS schedule and is scheduled for HD today. ? Hypertension/volume  - BP high despite multiple  medications and UF with HD. She is still above edw and will UF as tolerated with HD tomorrow.  May need to add clonidine. ? Anemia  - no esa ? Metabolic bone disease -   continue with home meds ? Nutrition - renal diet ? Diffuse arthralgias and myalgias - concerning for lupus flare, however low ESR and CRP with normal complements.  Less likely.   ? RLL pneumonia - on IV ceftriaxone and azithromycin.  Also started on Vancomycin given recent MRSA bacteremia.  Need to recheck blood cultures. ? SLE with pericarditis - Will order serologies as above. ? 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  Blood cultures pending. ? Disposition - per primary svc. ? ?Donetta Potts, MD ?[facility] ? ? ?

## 2022-03-20 NOTE — Progress Notes (Signed)
?PROGRESS NOTE ? ? ? ?MANIAH NADING  UQJ:[id number] DOB: Sep 10, 1981 DOA: 03/17/2022 ?PCP: Pcp, No  ? ?  ?Brief Narrative:  ?JARAH PEMBER is a 41 y.o. female with medical history significant for SLE with pericarditis, systolic and diastolic CHF, nephritis with ESRD, hypertension, MRSA bacteremia, noncompliance. ?  ?Patient presented to the ED with complaints of generalized body pain that started after hemodialysis on Saturday.  She reports generalized cramps and spasms.  She reports it might be lupus pain, but she has never generalized body pains with her lupus in the past.  Pain is particularly worse in her abdominal area, and left flank area.  She reports intermittent difficulty breathing since HD on Saturday, with productive cough.  She reports feeling ill all over, with chills last night, nausea and dry heaving also.  Denies swelling of her joints. ? ?Recent hospitalizations: ?4/1 to 03/05/22 for volume overload, ascites requiring paracentesis, overall of 3 L of fluid. ?  ?3/1 to 02/08/2022 for MRSA bacteremia treated with IV vancomycin with hemodialysis, completed course. ? ?There was initial concern for lupus flare with pain.  Case was discussed with [facility], but they were at capacity so patient was admitted to [facility] ? ?New events last 24 hours / Subjective: ?Patient feeling much better on Robaxin.  Discussed with her that we will start to wean down on her oral Dilaudid to prepare for discharge home tomorrow.  Patient concerned that she did not urinate yesterday. ? ?Assessment & Plan: ?  ? ?Principal Problem: ?  CAP (community acquired pneumonia) ?Active Problems: ?  Chronic combined systolic and diastolic congestive heart failure ([place]) ?  MDD (major depressive disorder) ?  Systemic lupus erythematosus (SLE) with pericarditis ([place]) ?  Non compliance w medication regimen ?  Lupus nephritis ([place]) ?  ESRD on hemodialysis ([place]) ?  Benign essential HTN ?  History of pulmonary embolus ?   Ascites ?  Abdominal pain ?  Pruritus ? ? ?Right-sided CAP ?-Sepsis ruled out ?-Continue Rocephin, azithromycin ? ?Diffuse arthralgias, myalgia - not likely lupus flare ?History of lupus ?-ESR 38, CRP 2.4, C4 20, C3 104 ?-No longer taking hydroxychloroquine ?-Pain management, trial robaxin  ?-Improved ?-Outpatient referral to rheumatology was placed by PCP 3/15.  Asked patient to follow-up ? ?Abdominal pain ?-CT abdomen pelvis largely unremarkable  ?-Insufficient ascites for paracentesis ?-Continue bowel regimen for constipation ? ?Hypertension ?-Hydralazine, Coreg, Norvasc, clonidine, losartan ? ?ESRD ?-Nephrology following for dialysis TTS ? ?Chronic combined systolic and diastolic heart failure ?-Volume managed by dialysis ? ?History of PE ?-Eliquis ? ? ?DVT prophylaxis:  ?SCDs Start: 03/17/22 2152 ?apixaban (ELIQUIS) tablet 5 mg  ?Code Status: Full ?Family Communication: None at bedside ?Disposition Plan:  ?Status is: Inpatient ?Remains inpatient appropriate because: Wean down on oral Dilaudid, hopeful discharge home 4/21 ? ?Consultants:  ?Nephrology ? ?Procedures:  ?None  ? ?Antimicrobials:  ?Anti-infectives (From admission, onward)  ? ? Start     Dose/Rate Route Frequency Ordered Stop  ? 03/17/22 2200  cefTRIAXone (ROCEPHIN) 2 g in sodium chloride 0.9 % 100 mL IVPB       ? 2 g ?200 mL/hr over 30 Minutes Intravenous Every 24 hours 03/17/22 2148 03/22/22 2159  ? 03/17/22 2200  azithromycin (ZITHROMAX) 500 mg in sodium chloride 0.9 % 250 mL IVPB       ? 500 mg ?250 mL/hr over 60 Minutes Intravenous Every 24 hours 03/17/22 2148 03/22/22 2159  ? ?  ? ? ? ?Objective: ?Vitals:  ?  03/19/22 1354 03/19/22 2133 03/19/22 2210 03/20/22 0409  ?BP: (!) 132/102 (!) 137/97 (!) 141/99 (!) 143/99  ?Pulse: 61 (!) 58 60 61  ?Resp: 16   18  ?Temp: 97.9 ?F (36.6 ?C)  97.7 ?F (36.5 ?C) 98.6 ?F (37 ?C)  ?TempSrc: Oral  Oral   ?SpO2: 100%  100% 97%  ?Weight:      ?Height:      ? ? ?Intake/Output Summary (Last 24 hours) at 03/20/2022  1018 ?Last data filed at 03/20/2022 0900 ?Gross per 24 hour  ?Intake 290 ml  ?Output --  ?Net 290 ml  ? ? ?Filed Weights  ? 03/18/22 0900 03/18/22 1109 03/18/22 1520  ?Weight: 61.3 kg 61.2 kg 57.5 kg  ? ? ?Examination:  ?General exam: Appears calm and comfortable  ?Respiratory system: Clear to auscultation. Respiratory effort normal. No respiratory distress. No conversational dyspnea. On room air  ?Cardiovascular system: S1 & S2 heard, RRR. No murmurs. No pedal edema. ?Gastrointestinal system: Abdomen is mildly distended, soft ?Central nervous system: Alert and oriented. No focal neurological deficits. Speech clear.  ?Extremities: Symmetric in appearance  ?Skin: No rashes, lesions or ulcers on exposed skin  ?Psychiatry: Judgement and insight appear normal. Mood & affect appropriate.  ? ?Data Reviewed: I have personally reviewed following labs and imaging studies ? ?CBC: ?Recent Labs  ?Lab 03/17/22 ?1246 03/18/22 ?0358 03/18/22 ?1045  ?WBC 5.9 7.0 6.3  ?NEUTROABS 4.0  --   --   ?HGB 11.0* 11.2* 11.1*  ?HCT 35.2* 37.0 36.1  ?MCV 89.8 90.0 89.6  ?PLT 216 261 273  ? ? ?Basic Metabolic Panel: ?Recent Labs  ?Lab 03/17/22 ?1246 03/18/22 ?0358 03/20/22 ?0451  ?NA 135 135 136  ?K 3.9 4.2 3.3*  ?CL 96* 97* 100  ?CO2 _0 ?GLUCOSE 83 75 92  ?BUN 32* 43* 32*  ?CREATININE 7.49* 8.60* 7.72*  ?CALCIUM 8.8* 8.4* 8.2*  ? ? ?GFR: ?Estimated Creatinine Clearance: 9 mL/min (A) (by C-G formula based on SCr of 7.72 mg/dL (H)). ?Liver Function Tests: ?Recent Labs  ?Lab 03/17/22 ?1246  ?AST 29  ?ALT 18  ?ALKPHOS 278*  ?BILITOT 0.8  ?PROT 6.9  ?ALBUMIN 3.2*  ? ? ?Recent Labs  ?Lab 03/17/22 ?1253  ?LIPASE 59*  ? ? ?No results for input(s): AMMONIA in the last 168 hours. ?Coagulation Profile: ?No results for input(s): INR, PROTIME in the last 168 hours. ?Cardiac Enzymes: ?No results for input(s): CKTOTAL, CKMB, CKMBINDEX, TROPONINI in the last 168 hours. ?BNP (last 3 results) ?No results for input(s): PROBNP in the last 8760  hours. ?HbA1C: ?No results for input(s): HGBA1C in the last 72 hours. ?CBG: ?Recent Labs  ?Lab 03/18/22 ?0115  ?GLUCAP 82  ? ? ?Lipid Profile: ?No results for input(s): CHOL, HDL, LDLCALC, TRIG, CHOLHDL, LDLDIRECT in the last 72 hours. ?Thyroid Function Tests: ?No results for input(s): TSH, T4TOTAL, FREET4, T3FREE, THYROIDAB in the last 72 hours. ?Anemia Panel: ?No results for input(s): VITAMINB12, FOLATE, FERRITIN, TIBC, IRON, RETICCTPCT in the last 72 hours. ?Sepsis Labs: ?Recent Labs  ?Lab 03/17/22 ?1718  ?PROCALCITON 0.97  ? ? ? ?Recent Results (from the past 240 hour(s))  ?Culture, blood (Routine X 2) w Reflex to ID Panel     Status: None (Preliminary result)  ? Collection Time: 03/18/22 10:36 AM  ? Specimen: Left Antecubital; Blood  ?Result Value Ref Range Status  ? Specimen Description LEFT ANTECUBITAL  Final  ? Special Requests   Final  ?  BOTTLES DRAWN AEROBIC AND ANAEROBIC Blood  Culture adequate volume  ? Culture   Final  ?  NO GROWTH < 24 HOURS ?Performed at [facility], [address], [place], [place] [address] ?  ? Report Status PENDING  Incomplete  ?Culture, blood (Routine X 2) w Reflex to ID Panel     Status: None (Preliminary result)  ? Collection Time: 03/18/22 10:38 AM  ? Specimen: BLOOD RIGHT HAND  ?Result Value Ref Range Status  ? Specimen Description BLOOD RIGHT HAND  Final  ? Special Requests   Final  ?  BOTTLES DRAWN AEROBIC AND ANAEROBIC Blood Culture results may not be optimal due to an excessive volume of blood received in culture bottles  ? Culture   Final  ?  NO GROWTH < 24 HOURS ?Performed at [facility], [address] [place], [place] [address] ?  ? Report Status PENDING  Incomplete  ? ?  ? ? ?Radiology Studies: ?No results found. ? ? ? ?Scheduled Meds: ? amLODipine  10 mg Oral Daily  ? apixaban  5 mg Oral BID  ? carvedilol  25 mg Oral BID WC  ? Chlorhexidine Gluconate Cloth  6 each Topical Daily  ? Chlorhexidine Gluconate Cloth  6 each Topical Q0600  ? cloNIDine  0.2 mg Oral Q8H  ?  feeding supplement  237 mL Oral BID BM  ? hydrALAZINE  100 mg Oral TID  ? losartan  100 mg Oral QHS  ? senna-docusate  2 tablet Oral BID  ? ?Continuous Infusions: ? sodium chloride    ? sodium chloride    ? azithromycin 500

## 2022-03-20 NOTE — Progress Notes (Signed)
?  03/20/22 1812  ?Vitals  ?BP (!) 151/100 ?(Pt BP remains high after antihypertensives to include clonodine. Denies symptoms.)  ?Pulse Rate 66  ?Resp 18  ?Post-Hemodialysis Assessment  ?Post-Hemodialysis Comments HD in suite, consent and orders verified, UF goal of 4500 mls including prime/rinse.  Hypertensive and appropriate for HD today. Telemetry on, lungs diminished, heart regular, abdominal sounds present. Distended abdomen complains of pain in lower back 3/10. Reports some itchiness despite recent benedryl, and no urination in 2 days. IV benedryl ordered, with some relief.                             HD tx tolerated well. Net UF removal 4000 ml. Post assessment completed. Baseline met; VS within normal limits. Blood returned per policy. CVC lumens blocked with 0.9% NS as ordered, catheter clamped and caps attached. Pt denies pain or needs at this time.  ? ? ?

## 2022-03-21 DIAGNOSIS — J189 Pneumonia, unspecified organism: Secondary | ICD-10-CM | POA: Diagnosis not present

## 2022-03-21 LAB — GLUCOSE, CAPILLARY
Glucose-Capillary: 92 mg/dL (ref 70–99)
Glucose-Capillary: 107 mg/dL — ABNORMAL HIGH (ref 70–99)

## 2022-03-21 MED ORDER — SODIUM CHLORIDE 0.9 % IV SOLN
2.0000 g | Freq: Once | INTRAVENOUS | Status: AC
  Administered 2022-03-21: 2 g via INTRAVENOUS
  Filled 2022-03-21: qty 20

## 2022-03-21 MED ORDER — HYDROMORPHONE HCL 2 MG PO TABS
1.0000 mg | ORAL_TABLET | ORAL | Status: DC | PRN
  Administered 2022-03-21: 2 mg via ORAL
  Filled 2022-03-21: qty 1

## 2022-03-21 MED ORDER — LACTULOSE 10 GM/15ML PO SOLN
20.0000 g | Freq: Once | ORAL | Status: AC
  Administered 2022-03-21: 20 g via ORAL
  Filled 2022-03-21: qty 30

## 2022-03-21 MED ORDER — METHOCARBAMOL 500 MG PO TABS: 500.0000 mg | ORAL_TABLET | Freq: Three times a day (TID) | ORAL | 0 refills | Status: DC | PRN

## 2022-03-21 MED ORDER — AZITHROMYCIN 250 MG PO TABS
500.0000 mg | ORAL_TABLET | Freq: Once | ORAL | Status: AC
  Administered 2022-03-21: 500 mg via ORAL
  Filled 2022-03-21: qty 2

## 2022-03-21 NOTE — Progress Notes (Signed)
Patient ID: Cathy [name], female   DOB: Aug 04, 1981, 41 y.o.   MRN: [id number] ?S: Feeling better but hasn't had BM. ?O:BP (!) 152/103 (BP Location: Left Arm)   Pulse 65   Temp 97.7 ?F (36.5 ?C) (Oral)   Resp 18   Ht 5' 7"  (1.702 m)   Wt 60 kg   SpO2 100%   BMI 20.72 kg/m?  ? ?Intake/Output Summary (Last 24 hours) at 03/21/2022 0929 ?Last data filed at 03/21/2022 7948 ?Gross per 24 hour  ?Intake 628.04 ml  ?Output 4000 ml  ?Net -3371.96 ml  ? ?Intake/Output: ?I/O last 3 completed shifts: ?In: 016 [P.O.:480; IV Piggyback:152] ?Out: 4000 [Other:4000] ? Intake/Output this shift: ? Total I/O ?In: 77 [P.O.:236] ?Out: -  ?Weight change:  ?Gen:NAD ?CVS: RRR ?Resp:CTA ?Abd: +BS, soft, mildly tender, mildly distended. ?Ext:no edema ? ?Recent Labs  ?Lab 03/17/22 ?1246 03/18/22 ?0358 03/20/22 ?0451  ?NA 135 135 136  ?K 3.9 4.2 3.3*  ?CL 96* 97* 100  ?CO2 26 23 24   ?GLUCOSE 83 75 92  ?BUN 32* 43* 32*  ?CREATININE 7.49* 8.60* 7.72*  ?ALBUMIN 3.2*  --   --   ?CALCIUM 8.8* 8.4* 8.2*  ?AST 29  --   --   ?ALT 18  --   --   ? ?Liver Function Tests: ?Recent Labs  ?Lab 03/17/22 ?1246  ?AST 29  ?ALT 18  ?ALKPHOS 278*  ?BILITOT 0.8  ?PROT 6.9  ?ALBUMIN 3.2*  ? ?Recent Labs  ?Lab 03/17/22 ?1253  ?LIPASE 59*  ? ?No results for input(s): AMMONIA in the last 168 hours. ?CBC: ?Recent Labs  ?Lab 03/17/22 ?1246 03/18/22 ?0358 03/18/22 ?1045  ?WBC 5.9 7.0 6.3  ?NEUTROABS 4.0  --   --   ?HGB 11.0* 11.2* 11.1*  ?HCT 35.2* 37.0 36.1  ?MCV 89.8 90.0 89.6  ?PLT 216 261 273  ? ?Cardiac Enzymes: ?No results for input(s): CKTOTAL, CKMB, CKMBINDEX, TROPONINI in the last 168 hours. ?CBG: ?Recent Labs  ?Lab 03/18/22 ?0115 03/21/22 ?5537  ?GLUCAP 82 92  ? ? ?Iron Studies: No results for input(s): IRON, TIBC, TRANSFERRIN, FERRITIN in the last 72 hours. ?Studies/Results: ?No results found. ? amLODipine  10 mg Oral Daily  ? apixaban  5 mg Oral BID  ? azithromycin  500 mg Oral Q2200  ? carvedilol  25 mg Oral BID WC  ? Chlorhexidine Gluconate Cloth  6  each Topical Daily  ? Chlorhexidine Gluconate Cloth  6 each Topical Q0600  ? cloNIDine  0.2 mg Oral Q8H  ? feeding supplement  237 mL Oral BID BM  ? hydrALAZINE  100 mg Oral TID  ? losartan  100 mg Oral QHS  ? senna-docusate  2 tablet Oral BID  ? ? ?BMET ?   ?Component Value Date/Time  ? NA 136 03/20/2022 0451  ? NA 136 12/10/2018 1142  ? K 3.3 (L) 03/20/2022 0451  ? CL 100 03/20/2022 0451  ? CO2 24 03/20/2022 0451  ? GLUCOSE 92 03/20/2022 0451  ? BUN 32 (H) 03/20/2022 0451  ? BUN 9 12/10/2018 1142  ? CREATININE 7.72 (H) 03/20/2022 0451  ? CALCIUM 8.2 (L) 03/20/2022 0451  ? GFRNONAA 6 (L) 03/20/2022 0451  ? GFRAA >60 08/07/2020 1209  ? ?CBC ?   ?Component Value Date/Time  ? WBC 6.3 03/18/2022 1045  ? RBC 4.03 03/18/2022 1045  ? HGB 11.1 (L) 03/18/2022 1045  ? HGB 6.6 (L) 12/04/2020 1158  ? HCT 36.1 03/18/2022 1045  ? HCT 21.0 (L) 01/12/2019  1157  ? PLT 273 03/18/2022 1045  ? PLT 228 01/12/2019 0904  ? MCV 89.6 03/18/2022 1045  ? MCV 92 01/12/2019 0904  ? MCH 27.5 03/18/2022 1045  ? MCHC 30.7 03/18/2022 1045  ? RDW 18.1 (H) 03/18/2022 1045  ? RDW 13.9 01/12/2019 0904  ? LYMPHSABS 0.9 03/17/2022 1246  ? LYMPHSABS 0.7 10/04/2018 1131  ? MONOABS 0.8 03/17/2022 1246  ? EOSABS 0.1 03/17/2022 1246  ? EOSABS 0.0 10/04/2018 1131  ? BASOSABS 0.0 03/17/2022 1246  ? BASOSABS 0.0 10/04/2018 1131  ? ?Dialysis Orders: Center: [facility]  on TTS . ?EDW 52 kg HD Bath 2K/2Ca  Time 4 hours Heparin none. Access RIJ TDC BFR 400 DFR 800    ?Hectoral 4 mcg IVP TIW,  ?  ?Assessment/Plan: ? Abdominal pain - mainly in lower abdomen and back.  CT scan with moderate ascites, had similar presentation earlier this month and paracentesis was negative for SBP.  [place] with small amount of peritoneal fluid and not enough for paracentesis. Started on iv ceftriaxone per primary.  Will dose with lactulose today for BM.  ? Left lower back pain - no osseous findings.  May be muscle spasms and have responded to robaxin.  Avoid baclofen in ESRD patients.   ? ESRD -   Will plan to keep on TTS schedule and is scheduled for HD today. ? Hypertension/volume  - BP high despite multiple medications and UF with HD. She is still above edw and will UF as tolerated with HD tomorrow.  May need to add clonidine. ? Anemia  - no esa ? Metabolic bone disease -   continue with home meds ? Nutrition - renal diet ? Diffuse arthralgias and myalgias - concerning for lupus flare, however low ESR and CRP with normal complements.  Less likely.   ? RLL pneumonia - on IV ceftriaxone and azithromycin.  Also started on Vancomycin given recent MRSA bacteremia.  Need to recheck blood cultures. ? SLE with pericarditis - Will order serologies as above. ? 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  Blood cultures pending. ? Disposition - hopeful discharge today if ok with primary svc and follow up with outpatient unit tomorrow.  If not will plan for HD tomorrow.  ? ?Donetta Potts, MD ?[facility] ? ? ?

## 2022-03-21 NOTE — Care Management Important Message (Signed)
Important Message ? ?Patient Details  ?Name: Cathy [name] ?MRN: [id number] ?Date of Birth: [birth year] ? ? ?Medicare Important Message Given:  Yes ? ? ? ? ?Tommy Medal ?03/21/2022, 11:51 AM ?

## 2022-03-21 NOTE — TOC Transition Note (Signed)
Transition of Care (TOC) - CM/SW Discharge Note ? ? ?Patient Details  ?Name: LADESHA PACINI ?MRN: [id number] ?Date of Birth: [birth year] ? ?Transition of Care (TOC) CM/SW Contact:  ?Shade Flood, LCSW ?Phone Number: ?03/21/2022, 10:59 AM ? ? ?Clinical Narrative:    ? ?Pt stable for dc today per MD. No TOC needs for dc. ? ?Final next level of care: Home/Self Care ?Barriers to Discharge: Barriers Resolved ? ? ?Patient Goals and CMS Choice ?Patient states their goals for this hospitalization and ongoing recovery are:: return to mother's house ?  ?  ? ?Discharge Placement ?  ?           ?  ?  ?  ?  ? ?Discharge Plan and Services ?In-house Referral: Clinical Social Work ?  ?           ?  ?  ?  ?  ?  ?  ?  ?  ?  ?  ? ?Social Determinants of Health (SDOH) Interventions ?  ? ? ?Readmission Risk Interventions ? ?  03/18/2022  ?  9:17 AM 03/05/2022  ?  4:42 PM 03/03/2022  ?  1:58 PM  ?Readmission Risk Prevention Plan  ?Transportation Screening Complete Complete Complete  ?Medication Review Press photographer) Complete Complete Complete  ?PCP or Specialist appointment within 3-5 days of discharge  Complete   ?[place] or Home Care Consult Complete Complete Complete  ?SW Recovery Care/Counseling Consult Complete Complete Complete  ?Palliative Care Screening Not Applicable Not Applicable Not Applicable  ?[place] Not Applicable Not Applicable Not Applicable  ? ? ? ? ? ?

## 2022-03-21 NOTE — Discharge Summary (Signed)
Physician Discharge Summary  ?Cathy [name] YBO:[id number] DOB: 1981/10/12 DOA: 03/17/2022 ? ?PCP: Gildardo Pounds, NP ? ?Admit date: 03/17/2022 ?Discharge date: 03/21/2022 ? ?Admitted From: Home ?Disposition:  Home ? ?Recommendations for Outpatient Follow-up:  ?Follow up with PCP in 1 week ?Follow up with rheumatology.  Outpatient referral was placed in March by primary care physician. ? ?Discharge Condition: Stable ?CODE STATUS: Full code ?Diet recommendation: Renal diet ? ?Brief/Interim Summary: ?Cathy [name] is a 41 y.o. female with medical history significant for SLE with pericarditis, systolic and diastolic CHF, nephritis with ESRD, hypertension, MRSA bacteremia, noncompliance. ?  ?Patient presented to the ED with complaints of generalized body pain that started after hemodialysis on Saturday.  She reports generalized cramps and spasms.  She reports it might be lupus pain, but she has never generalized body pains with her lupus in the past.  Pain is particularly worse in her abdominal area, and left flank area.  She reports intermittent difficulty breathing since HD on Saturday, with productive cough.  She reports feeling ill all over, with chills last night, nausea and dry heaving also.  Denies swelling of her joints. ?  ?Recent hospitalizations: ?4/1 to 03/05/22 for volume overload, ascites requiring paracentesis, overall of 3 L of fluid. ?  ?3/1 to 02/08/2022 for MRSA bacteremia treated with IV vancomycin with hemodialysis, completed course. ?  ?There was initial concern for lupus flare with pain.  Case was discussed with [facility], but they were at capacity so patient was admitted to [facility].  Patient's ESR, CRP, complement levels were checked.  Her diffuse arthralgia and myalgia was not felt to be lupus flare.  She received antibiotics for community-acquired pneumonia, pain medication and muscle relaxer for her pain.  She underwent dialysis while in the hospital. ? ?Discharge Diagnoses:   ? ?Principal Problem: ?  CAP (community acquired pneumonia) ?Active Problems: ?  Chronic combined systolic and diastolic congestive heart failure ([place]) ?  MDD (major depressive disorder) ?  Systemic lupus erythematosus (SLE) with pericarditis ([place]) ?  Non compliance w medication regimen ?  Lupus nephritis ([place]) ?  ESRD on hemodialysis ([place]) ?  Benign essential HTN ?  History of pulmonary embolus ?  Ascites ?  Abdominal pain ?  Pruritus ? ?Right-sided CAP ?-Sepsis ruled out ?-Completed 5 days of Rocephin, azithromycin ?  ?Diffuse arthralgias, myalgia - not likely lupus flare ?History of lupus ?-ESR 38, CRP 2.4, C4 20, C3 104 ?-No longer taking hydroxychloroquine ?-Improved ?-Outpatient referral to rheumatology was placed by PCP 3/15.  Asked patient to follow-up ?  ?Abdominal pain ?-CT abdomen pelvis largely unremarkable  ?-Insufficient ascites for paracentesis ?-Continue bowel regimen for constipation ?  ?Hypertension ?-Hydralazine, Coreg, Norvasc, clonidine, losartan ?  ?ESRD ?-Nephrology following for dialysis TTS ?  ?Chronic combined systolic and diastolic heart failure ?-Volume managed by dialysis ?  ?History of PE ?-Eliquis ? ?Discharge Instructions ? ?Discharge Instructions   ? ? Call MD for:  difficulty breathing, headache or visual disturbances   Complete by: As directed ?  ? Call MD for:  extreme fatigue   Complete by: As directed ?  ? Call MD for:  persistant dizziness or light-headedness   Complete by: As directed ?  ? Call MD for:  persistant nausea and vomiting   Complete by: As directed ?  ? Call MD for:  severe uncontrolled pain   Complete by: As directed ?  ? Call MD for:  temperature >100.4   Complete by: As directed ?  ?  Discharge instructions   Complete by: As directed ?  ? You were cared for by a hospitalist during your hospital stay. If you have any questions about your discharge medications or the care you received while you were in the hospital after you are discharged, you can call the unit  and ask to speak with the hospitalist on call if the hospitalist that took care of you is not available. Once you are discharged, your primary care physician will handle any further medical issues. Please note that NO REFILLS for any discharge medications will be authorized once you are discharged, as it is imperative that you return to your primary care physician (or establish a relationship with a primary care physician if you do not have one) for your aftercare needs so that they can reassess your need for medications and monitor your lab values.  ? Increase activity slowly   Complete by: As directed ?  ? No wound care   Complete by: As directed ?  ? ?  ? ?Allergies as of 03/21/2022   ? ?   Reactions  ? Oxycodone Itching  ? ?  ? ?  ?Medication List  ?  ? ?STOP taking these medications   ? ?HYDROcodone-acetaminophen 5-325 MG tablet ?Commonly known as: NORCO/VICODIN ?  ?hydroxychloroquine 200 MG tablet ?Commonly known as: PLAQUENIL ?  ?predniSONE 20 MG tablet ?Commonly known as: DELTASONE ?  ?vancomycin 750 MG/150ML Soln ?Commonly known as: VANCOREADY ?  ? ?  ? ?TAKE these medications   ? ?acetaminophen 500 MG tablet ?Commonly known as: TYLENOL ?Take 1,000 mg by mouth every 6 (six) hours as needed for moderate pain. ?  ?albuterol 108 (90 Base) MCG/ACT inhaler ?Commonly known as: VENTOLIN HFA ?Inhale 2 puffs into the lungs every 6 (six) hours as needed for wheezing or shortness of breath. ?  ?amLODipine 10 MG tablet ?Commonly known as: NORVASC ?Take 1 tablet (10 mg total) by mouth daily. ?  ?apixaban 5 MG Tabs tablet ?Commonly known as: ELIQUIS ?Take 1 tablet (5 mg total) by mouth 2 (two) times daily. ?  ?calcium acetate 667 MG capsule ?Commonly known as: PHOSLO ?Take 2 capsules (1,334 mg total) by mouth 3 (three) times daily with meals. ?  ?carvedilol 25 MG tablet ?Commonly known as: COREG ?Take 1 tablet (25 mg total) by mouth 2 (two) times daily with a meal. ?  ?cloNIDine 0.2 MG tablet ?Commonly known as:  CATAPRES ?Take 1 tablet (0.2 mg total) by mouth every 8 (eight) hours. ?  ?guaiFENesin 600 MG 12 hr tablet ?Commonly known as: [place] ?Take 600 mg by mouth 2 (two) times daily as needed for cough. ?  ?HECTOROL IV ?Doxercalciferol (Hectorol) ?  ?hydrALAZINE 100 MG tablet ?Commonly known as: APRESOLINE ?Take 1 tablet (100 mg total) by mouth 3 (three) times daily. ?  ?ipratropium 17 MCG/ACT inhaler ?Commonly known as: ATROVENT HFA ?Inhale 2 puffs into the lungs every 6 (six) hours as needed for wheezing. ?  ?losartan 100 MG tablet ?Commonly known as: COZAAR ?Take 1 tablet (100 mg total) by mouth at bedtime. ?  ?methocarbamol 500 MG tablet ?Commonly known as: Robaxin ?Take 1 tablet (500 mg total) by mouth every 8 (eight) hours as needed for muscle spasms. ?  ?Omron 3 Series BP Monitor Devi ?Use as directed 2 times a day ?  ?sevelamer carbonate 800 MG tablet ?Commonly known as: RENVELA ?Take 1 tablet (800 mg total) by mouth 3 (three) times daily. ?  ? ?  ? ? Follow-up  Information   ? ? Gildardo Pounds, NP Follow up.   ?Specialty: Nurse Practitioner ?Why: please follow up on rheumatology referral ?Contact information: ?[address] ?[place] [address] ?[phone] ? ? ?  ?  ? ?  ?  ? ?  ? ?Allergies  ?Allergen Reactions  ? Oxycodone Itching  ? ? ? ? ?Procedures/Studies: ?CT ABDOMEN PELVIS WO CONTRAST ? ?Result Date: 03/17/2022 ?CLINICAL DATA:  Acute left-sided abdominal pain. EXAM: CT ABDOMEN AND PELVIS WITHOUT CONTRAST TECHNIQUE: Multidetector CT imaging of the abdomen and pelvis was performed following the standard protocol without IV contrast. RADIATION DOSE REDUCTION: This exam was performed according to the departmental dose-optimization program which includes automated exposure control, adjustment of the mA and/or kV according to patient size and/or use of iterative reconstruction technique. COMPARISON:  March 01, 2022. FINDINGS: Lower chest: Small possibly loculated pleural effusions are noted. Right lower  lobe airspace opacity is noted concerning for pneumonia or atelectasis. Hepatobiliary: No focal liver abnormality is seen. No gallstones, gallbladder wall thickening, or biliary dilatation. Pancreas: Unremarkable.

## 2022-03-23 LAB — CULTURE, BLOOD (ROUTINE X 2)
Culture: NO GROWTH
Culture: NO GROWTH
Special Requests: ADEQUATE

## 2022-03-24 ENCOUNTER — Telehealth: Payer: Self-pay

## 2022-03-24 NOTE — Telephone Encounter (Signed)
Transition Care Management Follow-up Telephone Call ?Date of discharge and from where: 03/21/2022, [facility] ?How have you been since you were released from the hospital? She stated that she is feeling a lot better.  ?Any questions or concerns? Yes - She currently lives in [place]  and will be moving next week to [place], which is where she receives dialysis. She is concerned about transportation to dialysis and medical appointments. She called [employer] and was told at one point there was no driver available. It was not clear how many times she attempted to schedule rides with [facility].   ?Her Medicaid is with [facility] and she needs to have it switched to [facility]. She said that her sister is planning to take her to DSS today to update her address to [facility].   She will then be able to get transportation to her appointments through [facility].  ?When asked if  she spoke to the SW at HD about transportation she was told that she needs to switch over her Medicaid. ?She is also interested in changing her PCP to a provider in [facility].  She was agreeable to having Lyndee Hensen, RN CM contact her to discuss options. She is not sure where to start and will keep her appointment with Dr Margarita Rana in [place] until she has another appointment scheduled with a new PCP.  ? ?Items Reviewed: ?Did the pt receive and understand the discharge instructions provided? Yes  ?Medications obtained and verified? Yes - she said she has all medications and did not have any questions about her med regime.  ?Other? No  ?Any new allergies since your discharge? No  ?Dietary orders reviewed? No ?Do you have support at home? Yes  - she is staying with her mother ? ?Home Care and Equipment/Supplies: ?Were home health services ordered? no ?If so, what is the name of the agency? N/a  ?Has the agency set up a time to come to the patient's home? not applicable ?Were any new equipment or medical supplies ordered?   No ?What is the name of the medical supply agency? N/a ?Were you able to get the supplies/equipment? not applicable ?Do you have any questions related to the use of the equipment or supplies? No ? ?She attends HD: [facility]  in [place].  ? ?Functional Questionnaire: (I = Independent and D = Dependent) ?ADLs: independent.  ? ? ?Follow up appointments reviewed: ? ?PCP Hospital f/u appt confirmed? Yes  Scheduled to see Dr Margarita Rana - 03/31/2022.  ?[facility] f/u appt confirmed?  None scheduled at this time    ?Are transportation arrangements needed? Yes  - concerns noted above. ?If their condition worsens, is the pt aware to call PCP or go to the Emergency Dept.? Yes ?Was the patient provided with contact information for the PCP's office or ED? Yes ?Was to pt encouraged to call back with questions or concerns? Yes ? ?

## 2022-03-25 ENCOUNTER — Telehealth: Payer: Self-pay

## 2022-03-25 NOTE — Telephone Encounter (Signed)
Attempted to call Ms Heldman to discuss possible providers in [place] that she may establish primary care with. ? ?No Answer, left voicemail requesting return call.  ? ? ? ?Debria Garret RN ?[facility] [employer] ?

## 2022-03-25 NOTE — Telephone Encounter (Signed)
Thanks Trish

## 2022-03-27 DIAGNOSIS — D689 Coagulation defect, unspecified: Secondary | ICD-10-CM | POA: Diagnosis not present

## 2022-03-27 DIAGNOSIS — Z992 Dependence on renal dialysis: Secondary | ICD-10-CM | POA: Diagnosis not present

## 2022-03-27 DIAGNOSIS — R7881 Bacteremia: Secondary | ICD-10-CM | POA: Diagnosis not present

## 2022-03-27 DIAGNOSIS — N2581 Secondary hyperparathyroidism of renal origin: Secondary | ICD-10-CM | POA: Diagnosis not present

## 2022-03-27 DIAGNOSIS — L299 Pruritus, unspecified: Secondary | ICD-10-CM | POA: Diagnosis not present

## 2022-03-27 DIAGNOSIS — N186 End stage renal disease: Secondary | ICD-10-CM | POA: Diagnosis not present

## 2022-03-27 DIAGNOSIS — T782XXA Anaphylactic shock, unspecified, initial encounter: Secondary | ICD-10-CM | POA: Diagnosis not present

## 2022-03-29 DIAGNOSIS — Z992 Dependence on renal dialysis: Secondary | ICD-10-CM | POA: Diagnosis not present

## 2022-03-29 DIAGNOSIS — R7881 Bacteremia: Secondary | ICD-10-CM | POA: Diagnosis not present

## 2022-03-29 DIAGNOSIS — N186 End stage renal disease: Secondary | ICD-10-CM | POA: Diagnosis not present

## 2022-03-29 DIAGNOSIS — T782XXA Anaphylactic shock, unspecified, initial encounter: Secondary | ICD-10-CM | POA: Diagnosis not present

## 2022-03-29 DIAGNOSIS — D689 Coagulation defect, unspecified: Secondary | ICD-10-CM | POA: Diagnosis not present

## 2022-03-29 DIAGNOSIS — L299 Pruritus, unspecified: Secondary | ICD-10-CM | POA: Diagnosis not present

## 2022-03-29 DIAGNOSIS — N2581 Secondary hyperparathyroidism of renal origin: Secondary | ICD-10-CM | POA: Diagnosis not present

## 2022-03-30 DIAGNOSIS — M3214 Glomerular disease in systemic lupus erythematosus: Secondary | ICD-10-CM | POA: Diagnosis not present

## 2022-03-30 DIAGNOSIS — N186 End stage renal disease: Secondary | ICD-10-CM | POA: Diagnosis not present

## 2022-03-30 DIAGNOSIS — Z992 Dependence on renal dialysis: Secondary | ICD-10-CM | POA: Diagnosis not present

## 2022-03-31 ENCOUNTER — Encounter: Payer: Self-pay | Admitting: Hematology and Oncology

## 2022-03-31 ENCOUNTER — Ambulatory Visit: Payer: Medicare Other | Admitting: Family Medicine

## 2022-04-01 ENCOUNTER — Telehealth: Payer: Self-pay

## 2022-04-01 DIAGNOSIS — N186 End stage renal disease: Secondary | ICD-10-CM | POA: Diagnosis not present

## 2022-04-01 DIAGNOSIS — Z992 Dependence on renal dialysis: Secondary | ICD-10-CM | POA: Diagnosis not present

## 2022-04-01 DIAGNOSIS — N2581 Secondary hyperparathyroidism of renal origin: Secondary | ICD-10-CM | POA: Diagnosis not present

## 2022-04-01 NOTE — Telephone Encounter (Signed)
Attempted call to follow up with Ms Kenton Kingfisher. Had been referred to provide Ms Wrubel with some primary care providers that accept new Medicare patients in the [place] area.  ?Was able to connect with Ms Patch on 03/25/22 and discussed needs. She requested information to be sent via Text message. Provided the following on that date for possible primary care providers to contact. ?1) [facility](location and phone number provided. ?2). [facility] in [place] and phone number provided) ?3)[facility] ?  ?Attempted call today for follow up as planned and no answer and unable to leave a voicemail due to being full. Texted Ms Carelli today requesting a call back, if no answer, will attempt later today. ? ?Debria Garret RN ?[facility] ? ?

## 2022-04-03 DIAGNOSIS — N2581 Secondary hyperparathyroidism of renal origin: Secondary | ICD-10-CM | POA: Diagnosis not present

## 2022-04-03 DIAGNOSIS — Z992 Dependence on renal dialysis: Secondary | ICD-10-CM | POA: Diagnosis not present

## 2022-04-03 DIAGNOSIS — N186 End stage renal disease: Secondary | ICD-10-CM | POA: Diagnosis not present

## 2022-04-04 ENCOUNTER — Telehealth: Payer: Self-pay

## 2022-04-04 NOTE — Telephone Encounter (Signed)
Attempted to follow up with Cathy [name] regarding potential primary care offices in [place] area to contact. ? ?No answer, voicemail full and unable to leave a message.  ? ?Previously with her permission had sent 3 potential primary care offices to establish care with via text message. Provided phone numbers and addresses at that time. ?Plan: will attempt to follow up again next week with client. ? ?Debria Garret RN ?[facility] ?

## 2022-04-05 DIAGNOSIS — N186 End stage renal disease: Secondary | ICD-10-CM | POA: Diagnosis not present

## 2022-04-05 DIAGNOSIS — N2581 Secondary hyperparathyroidism of renal origin: Secondary | ICD-10-CM | POA: Diagnosis not present

## 2022-04-05 DIAGNOSIS — Z992 Dependence on renal dialysis: Secondary | ICD-10-CM | POA: Diagnosis not present

## 2022-04-08 DIAGNOSIS — N186 End stage renal disease: Secondary | ICD-10-CM | POA: Diagnosis not present

## 2022-04-08 DIAGNOSIS — Z992 Dependence on renal dialysis: Secondary | ICD-10-CM | POA: Diagnosis not present

## 2022-04-08 DIAGNOSIS — N2581 Secondary hyperparathyroidism of renal origin: Secondary | ICD-10-CM | POA: Diagnosis not present

## 2022-04-10 ENCOUNTER — Other Ambulatory Visit: Payer: Self-pay

## 2022-04-10 ENCOUNTER — Encounter: Payer: Self-pay | Admitting: Hematology and Oncology

## 2022-04-10 DIAGNOSIS — Z992 Dependence on renal dialysis: Secondary | ICD-10-CM | POA: Diagnosis not present

## 2022-04-10 DIAGNOSIS — N2581 Secondary hyperparathyroidism of renal origin: Secondary | ICD-10-CM | POA: Diagnosis not present

## 2022-04-10 DIAGNOSIS — N186 End stage renal disease: Secondary | ICD-10-CM

## 2022-04-14 ENCOUNTER — Ambulatory Visit (HOSPITAL_COMMUNITY): Payer: Medicare HMO | Admitting: Anesthesiology

## 2022-04-14 ENCOUNTER — Encounter (HOSPITAL_COMMUNITY)
Admission: RE | Admit: 2022-04-14 | Discharge: 2022-04-14 | Disposition: A | Discharge status: Home / Self Care | Payer: Medicare HMO | Source: Ambulatory Visit | Attending: Vascular Surgery | Admitting: Vascular Surgery

## 2022-04-14 NOTE — Pre-Procedure Instructions (Signed)
Attempted to do pre-op phone call. Patient states she does not have transportation for tomorrow. She took number to call office. I also interoffice messaged Minette Brine to let her know as well. ?

## 2022-04-16 DIAGNOSIS — Z992 Dependence on renal dialysis: Secondary | ICD-10-CM | POA: Diagnosis not present

## 2022-04-16 DIAGNOSIS — N2581 Secondary hyperparathyroidism of renal origin: Secondary | ICD-10-CM | POA: Diagnosis not present

## 2022-04-16 DIAGNOSIS — N186 End stage renal disease: Secondary | ICD-10-CM | POA: Diagnosis not present

## 2022-04-19 DIAGNOSIS — N2581 Secondary hyperparathyroidism of renal origin: Secondary | ICD-10-CM | POA: Diagnosis not present

## 2022-04-19 DIAGNOSIS — Z992 Dependence on renal dialysis: Secondary | ICD-10-CM | POA: Diagnosis not present

## 2022-04-19 DIAGNOSIS — N186 End stage renal disease: Secondary | ICD-10-CM | POA: Diagnosis not present

## 2022-04-24 DIAGNOSIS — Z992 Dependence on renal dialysis: Secondary | ICD-10-CM | POA: Diagnosis not present

## 2022-04-24 DIAGNOSIS — N2581 Secondary hyperparathyroidism of renal origin: Secondary | ICD-10-CM | POA: Diagnosis not present

## 2022-04-24 DIAGNOSIS — N186 End stage renal disease: Secondary | ICD-10-CM | POA: Diagnosis not present

## 2022-04-25 ENCOUNTER — Encounter: Payer: Self-pay | Admitting: Hematology and Oncology

## 2022-04-25 ENCOUNTER — Encounter (HOSPITAL_COMMUNITY): Payer: Self-pay

## 2022-04-25 ENCOUNTER — Encounter (HOSPITAL_COMMUNITY)
Admission: RE | Admit: 2022-04-25 | Discharge: 2022-04-25 | Disposition: A | Discharge status: Home / Self Care | Payer: Medicare HMO | Source: Ambulatory Visit | Attending: Vascular Surgery | Admitting: Vascular Surgery

## 2022-04-25 VITALS — Ht 67.0 in | Wt 130.0 lb

## 2022-04-25 DIAGNOSIS — Z01818 Encounter for other preprocedural examination: Secondary | ICD-10-CM

## 2022-04-25 NOTE — Telephone Encounter (Signed)
Signing encounter, see note 01/03/21

## 2022-04-25 NOTE — Progress Notes (Signed)
Patient is to stop Eliquis prior to procedure per Dr Donnetta Hutching. Last dose should be on Sunday 04/27/2022 and she will resume after the procedure on Tuesday 04/29/2022.  Patient notified and verbalizes understanding .

## 2022-04-26 DIAGNOSIS — N2581 Secondary hyperparathyroidism of renal origin: Secondary | ICD-10-CM | POA: Diagnosis not present

## 2022-04-26 DIAGNOSIS — Z992 Dependence on renal dialysis: Secondary | ICD-10-CM | POA: Diagnosis not present

## 2022-04-26 DIAGNOSIS — N186 End stage renal disease: Secondary | ICD-10-CM | POA: Diagnosis not present

## 2022-04-28 DIAGNOSIS — Z992 Dependence on renal dialysis: Secondary | ICD-10-CM | POA: Diagnosis not present

## 2022-04-28 DIAGNOSIS — N2581 Secondary hyperparathyroidism of renal origin: Secondary | ICD-10-CM | POA: Diagnosis not present

## 2022-04-28 DIAGNOSIS — N186 End stage renal disease: Secondary | ICD-10-CM | POA: Diagnosis not present

## 2022-04-29 ENCOUNTER — Encounter (HOSPITAL_COMMUNITY): Payer: Self-pay | Admitting: Vascular Surgery

## 2022-04-29 ENCOUNTER — Encounter (HOSPITAL_COMMUNITY)
Admission: RE | Disposition: A | Discharge status: Home / Self Care | Payer: Self-pay | Source: Home / Self Care | Attending: Vascular Surgery

## 2022-04-29 ENCOUNTER — Ambulatory Visit (HOSPITAL_COMMUNITY)
Admission: RE | Admit: 2022-04-29 | Discharge: 2022-04-29 | Disposition: A | Discharge status: Home / Self Care | Payer: Medicare HMO | Attending: Vascular Surgery | Admitting: Vascular Surgery

## 2022-04-29 DIAGNOSIS — Z538 Procedure and treatment not carried out for other reasons: Secondary | ICD-10-CM | POA: Insufficient documentation

## 2022-04-29 DIAGNOSIS — F329 Major depressive disorder, single episode, unspecified: Secondary | ICD-10-CM

## 2022-04-29 DIAGNOSIS — I160 Hypertensive urgency: Secondary | ICD-10-CM

## 2022-04-29 DIAGNOSIS — D631 Anemia in chronic kidney disease: Secondary | ICD-10-CM

## 2022-04-29 DIAGNOSIS — D649 Anemia, unspecified: Secondary | ICD-10-CM

## 2022-04-29 DIAGNOSIS — I5020 Unspecified systolic (congestive) heart failure: Secondary | ICD-10-CM

## 2022-04-29 DIAGNOSIS — I120 Hypertensive chronic kidney disease with stage 5 chronic kidney disease or end stage renal disease: Secondary | ICD-10-CM | POA: Diagnosis present

## 2022-04-29 DIAGNOSIS — F17200 Nicotine dependence, unspecified, uncomplicated: Secondary | ICD-10-CM

## 2022-04-29 DIAGNOSIS — M3212 Pericarditis in systemic lupus erythematosus: Secondary | ICD-10-CM

## 2022-04-29 DIAGNOSIS — J45909 Unspecified asthma, uncomplicated: Secondary | ICD-10-CM

## 2022-04-29 DIAGNOSIS — I10 Essential (primary) hypertension: Secondary | ICD-10-CM

## 2022-04-29 DIAGNOSIS — N736 Female pelvic peritoneal adhesions (postinfective): Secondary | ICD-10-CM

## 2022-04-29 DIAGNOSIS — Z7901 Long term (current) use of anticoagulants: Secondary | ICD-10-CM | POA: Diagnosis not present

## 2022-04-29 DIAGNOSIS — J189 Pneumonia, unspecified organism: Secondary | ICD-10-CM

## 2022-04-29 DIAGNOSIS — Z01818 Encounter for other preprocedural examination: Secondary | ICD-10-CM

## 2022-04-29 DIAGNOSIS — Z91148 Patient's other noncompliance with medication regimen for other reason: Secondary | ICD-10-CM

## 2022-04-29 DIAGNOSIS — R7881 Bacteremia: Secondary | ICD-10-CM

## 2022-04-29 DIAGNOSIS — I2699 Other pulmonary embolism without acute cor pulmonale: Secondary | ICD-10-CM

## 2022-04-29 DIAGNOSIS — Z992 Dependence on renal dialysis: Secondary | ICD-10-CM

## 2022-04-29 DIAGNOSIS — E8770 Fluid overload, unspecified: Secondary | ICD-10-CM

## 2022-04-29 DIAGNOSIS — N186 End stage renal disease: Secondary | ICD-10-CM

## 2022-04-29 DIAGNOSIS — I129 Hypertensive chronic kidney disease with stage 1 through stage 4 chronic kidney disease, or unspecified chronic kidney disease: Secondary | ICD-10-CM

## 2022-04-29 DIAGNOSIS — L299 Pruritus, unspecified: Secondary | ICD-10-CM

## 2022-04-29 DIAGNOSIS — I5042 Chronic combined systolic (congestive) and diastolic (congestive) heart failure: Secondary | ICD-10-CM

## 2022-04-29 DIAGNOSIS — I3139 Other pericardial effusion (noninflammatory): Secondary | ICD-10-CM

## 2022-04-29 DIAGNOSIS — E875 Hyperkalemia: Secondary | ICD-10-CM

## 2022-04-29 DIAGNOSIS — R188 Other ascites: Secondary | ICD-10-CM

## 2022-04-29 DIAGNOSIS — O0942 Supervision of pregnancy with grand multiparity, second trimester: Secondary | ICD-10-CM

## 2022-04-29 DIAGNOSIS — Z5111 Encounter for antineoplastic chemotherapy: Secondary | ICD-10-CM

## 2022-04-29 DIAGNOSIS — R109 Unspecified abdominal pain: Secondary | ICD-10-CM

## 2022-04-29 DIAGNOSIS — M3214 Glomerular disease in systemic lupus erythematosus: Secondary | ICD-10-CM

## 2022-04-29 LAB — POCT PREGNANCY, URINE: Preg Test, Ur: NEGATIVE

## 2022-04-29 LAB — POCT I-STAT, CHEM 8
BUN: 34 mg/dL — ABNORMAL HIGH (ref 6–20)
Calcium, Ion: 1.04 mmol/L — ABNORMAL LOW (ref 1.15–1.40)
Chloride: 101 mmol/L (ref 98–111)
Creatinine, Ser: 6.3 mg/dL — ABNORMAL HIGH (ref 0.44–1.00)
Glucose, Bld: 91 mg/dL (ref 70–99)
HCT: 35 % — ABNORMAL LOW (ref 36.0–46.0)
Hemoglobin: 11.9 g/dL — ABNORMAL LOW (ref 12.0–15.0)
Potassium: 4.6 mmol/L (ref 3.5–5.1)
Sodium: 139 mmol/L (ref 135–145)
TCO2: 33 mmol/L — ABNORMAL HIGH (ref 22–32)

## 2022-04-29 SURGERY — ARTERIOVENOUS (AV) FISTULA CREATION
Anesthesia: General

## 2022-04-29 MED ORDER — LIDOCAINE HCL (PF) 2 % IJ SOLN
INTRAMUSCULAR | Status: AC
  Filled 2022-04-29: qty 5

## 2022-04-29 MED ORDER — SODIUM CHLORIDE 0.9 % IV SOLN: INTRAVENOUS | Status: DC

## 2022-04-29 MED ORDER — PROPOFOL 10 MG/ML IV BOLUS
INTRAVENOUS | Status: AC
  Filled 2022-04-29: qty 20

## 2022-04-29 MED ORDER — CHLORHEXIDINE GLUCONATE 0.12 % MT SOLN
15.0000 mL | Freq: Once | OROMUCOSAL | Status: DC
  Filled 2022-04-29: qty 15

## 2022-04-29 MED ORDER — FENTANYL CITRATE (PF) 100 MCG/2ML IJ SOLN
INTRAMUSCULAR | Status: AC
  Filled 2022-04-29: qty 2

## 2022-04-29 MED ORDER — LACTATED RINGERS IV SOLN
INTRAVENOUS | Status: DC
Start: 2022-04-29 — End: 2022-04-29

## 2022-04-29 MED ORDER — CHLORHEXIDINE GLUCONATE 4 % EX LIQD: 60.0000 mL | Freq: Once | CUTANEOUS | Status: DC

## 2022-04-29 MED ORDER — HEPARIN SODIUM (PORCINE) 1000 UNIT/ML IJ SOLN
INTRAMUSCULAR | Status: AC
  Filled 2022-04-29: qty 6

## 2022-04-29 MED ORDER — GLYCOPYRROLATE PF 0.2 MG/ML IJ SOSY
PREFILLED_SYRINGE | INTRAMUSCULAR | Status: AC
  Filled 2022-04-29: qty 1

## 2022-04-29 MED ORDER — ORAL CARE MOUTH RINSE: 15.0000 mL | Freq: Once | OROMUCOSAL | Status: DC

## 2022-04-29 MED ORDER — KETAMINE HCL 50 MG/5ML IJ SOSY
PREFILLED_SYRINGE | INTRAMUSCULAR | Status: AC
  Filled 2022-04-29: qty 5

## 2022-04-29 MED ORDER — DEXMEDETOMIDINE HCL IN NACL 80 MCG/20ML IV SOLN
INTRAVENOUS | Status: AC
  Filled 2022-04-29: qty 20

## 2022-04-29 MED ORDER — CEFAZOLIN SODIUM-DEXTROSE 2-4 GM/100ML-% IV SOLN
2.0000 g | INTRAVENOUS | Status: DC
  Filled 2022-04-29: qty 100

## 2022-04-29 MED ORDER — MIDAZOLAM HCL 2 MG/2ML IJ SOLN
INTRAMUSCULAR | Status: AC
  Filled 2022-04-29: qty 2

## 2022-04-29 NOTE — Consult Note (Signed)
[facility] [place]  Patient name: Cathy [name] MRN: [id number] DOB: 09-07-1981 Sex: female    HPI: Cathy [name] is a 41 y.o. female presented to [facility] today for planned outpatient left arm hemodialysis access.  She had recently been admitted to [facility] in March of this year with catheter sepsis.  She was seen in consultation with my partners in [place].  She had a vein map at that time which showed she may be a candidate for left arm AV fistula.  She lives in the [place] area and therefore was posted for me today as an outpatient.  On presentation to short stay, her diastolic blood pressure was 140 mmHg.  I discussed this with Dr.Keil with anesthesia.  This obviously is a nonemergent case and we did not feel that it was appropriate to proceed with extreme hypertension.  I discussed this with the patient.  I will also contact Dr. Marval Regal regarding her hypertension.  She will be difficult to optimize for surgery with her multiple medical issues.  I did image her left arm veins with SonoSite ultrasound in the holding area.  She has a very sclerotic cephalic vein at the antecubital space.  Her basilic vein is not sclerotic and is moderate size.  I did discuss surgical options with the patient.  She may be a candidate for basilic vein fistula and I explained to her that this would require 2 stages.  The first would be brachiobasilic fistula followed by an office visit in approximately 1 month.  If she had adequate maturation then we would plan second stage transposition.  Also explained that at the time of surgery we may feel that her vein is inadequate for fistula attempt and we would place a left upper arm AV Gore-Tex graft.  Current Facility-Administered Medications  Medication Dose Route Frequency Provider Last Rate Last Admin   0.9 %  sodium chloride infusion   Intravenous Continuous Battula,  Rajamani C, MD       0.9 %  sodium chloride infusion   Intravenous Continuous Marabelle Cushman, Arvilla Meres, MD       ceFAZolin (ANCEF) IVPB 2g/100 mL premix  2 g Intravenous 30 min Pre-Op [place] Shiffman, Arvilla Meres, MD       chlorhexidine (HIBICLENS) 4 % liquid 4 application.  60 mL Topical Once Dillan Candela, Arvilla Meres, MD       And   [START ON 04/30/2022] chlorhexidine (HIBICLENS) 4 % liquid 4 application.  60 mL Topical Once Spirit Wernli, Arvilla Meres, MD       chlorhexidine (PERIDEX) 0.12 % solution 15 mL  15 mL Mouth/Throat Once Battula, Rajamani C, MD       Or   MEDLINE mouth rinse  15 mL Mouth Rinse Once Battula, Rajamani C, MD         PHYSICAL EXAM: Vitals:   04/29/22 0655 04/29/22 0700  BP: (!) 198/125 (!) 190/143  Pulse: 61 65  Resp:  (!) 24  Temp: 98.4 F (36.9 C)   TempSrc: Oral   SpO2: 100% 100%  Weight: 55.3 kg   Height: 5\' 7"  (1.702 m)     GENERAL: The patient is a well-nourished female, in no acute distress. The vital signs are documented above. Palpable radial pulses bilaterally.  Small surface veins bilaterally  MEDICAL ISSUES: End-stage renal disease with plan for outpatient access.  Presented with extreme hypertension and was not felt safe to proceed.  We will reschedule once better controlled hypertension.  She is on DOAC for pulmonary embolus in March.  We will hold her anticoagulation prior to the next surgery   [name], MD FACS [facility] [phone]  Note: Portions of this report may have been transcribed using voice recognition software.  Every effort has been made to ensure accuracy; however, inadvertent computerized transcription errors may still be present.

## 2022-04-29 NOTE — Anesthesia Preprocedure Evaluation (Deleted)
Anesthesia Evaluation  Patient identified by MRN, date of birth, ID band Patient awake    Reviewed: Allergy & Precautions, H&P , NPO status , Patient's Chart, lab work & pertinent test results, reviewed documented beta blocker date and time   Airway Mallampati: II  TM Distance: >3 FB Neck ROM: full    Dental no notable dental hx.    Pulmonary shortness of breath, asthma , Current Smoker and Patient abstained from smoking.,    Pulmonary exam normal breath sounds clear to auscultation       Cardiovascular Exercise Tolerance: Good hypertension, +CHF   Rhythm:regular Rate:Normal     Neuro/Psych PSYCHIATRIC DISORDERS Anxiety Depression Bipolar Disorder negative neurological ROS     GI/Hepatic Neg liver ROS, GERD  Medicated,  Endo/Other  negative endocrine ROS  Renal/GU ESRF and DialysisRenal disease  negative genitourinary   Musculoskeletal   Abdominal   Peds  Hematology  (+) Blood dyscrasia, anemia ,   Anesthesia Other Findings   Reproductive/Obstetrics negative OB ROS                             Anesthesia Physical Anesthesia Plan  ASA: 4  Anesthesia Plan: General   Post-op Pain Management:    Induction:   PONV Risk Score and Plan:   Airway Management Planned:   Additional Equipment:   Intra-op Plan:   Post-operative Plan:   Informed Consent: I have reviewed the patients History and Physical, chart, labs and discussed the procedure including the risks, benefits and alternatives for the proposed anesthesia with the patient or authorized representative who has indicated his/her understanding and acceptance.     Dental Advisory Given  Plan Discussed with: CRNA  Anesthesia Plan Comments:         Anesthesia Quick Evaluation

## 2022-04-29 NOTE — OR Nursing (Signed)
Patient surgery cancelled due to elevated blood pressure by Dr. Donnetta Hutching.  Patient is to follow up with her nephrologist. Cancel order placed in chl

## 2022-04-29 NOTE — Progress Notes (Signed)
Pt had high BP's (198/125, 190/143, 208/126). Dr Briant Cedar and Dr. Donnetta Hutching agree to cancel for now.

## 2022-04-30 DIAGNOSIS — Z992 Dependence on renal dialysis: Secondary | ICD-10-CM | POA: Diagnosis not present

## 2022-04-30 DIAGNOSIS — M3214 Glomerular disease in systemic lupus erythematosus: Secondary | ICD-10-CM | POA: Diagnosis not present

## 2022-04-30 DIAGNOSIS — N186 End stage renal disease: Secondary | ICD-10-CM | POA: Diagnosis not present

## 2022-05-01 DIAGNOSIS — N186 End stage renal disease: Secondary | ICD-10-CM | POA: Diagnosis not present

## 2022-05-01 DIAGNOSIS — N2581 Secondary hyperparathyroidism of renal origin: Secondary | ICD-10-CM | POA: Diagnosis not present

## 2022-05-01 DIAGNOSIS — Z992 Dependence on renal dialysis: Secondary | ICD-10-CM | POA: Diagnosis not present

## 2022-05-05 DIAGNOSIS — N186 End stage renal disease: Secondary | ICD-10-CM | POA: Diagnosis not present

## 2022-05-05 DIAGNOSIS — Z992 Dependence on renal dialysis: Secondary | ICD-10-CM | POA: Diagnosis not present

## 2022-05-05 DIAGNOSIS — N2581 Secondary hyperparathyroidism of renal origin: Secondary | ICD-10-CM | POA: Diagnosis not present

## 2022-05-07 ENCOUNTER — Other Ambulatory Visit: Payer: Self-pay

## 2022-05-07 DIAGNOSIS — N186 End stage renal disease: Secondary | ICD-10-CM

## 2022-05-08 DIAGNOSIS — N2581 Secondary hyperparathyroidism of renal origin: Secondary | ICD-10-CM | POA: Diagnosis not present

## 2022-05-08 DIAGNOSIS — Z992 Dependence on renal dialysis: Secondary | ICD-10-CM | POA: Diagnosis not present

## 2022-05-08 DIAGNOSIS — N186 End stage renal disease: Secondary | ICD-10-CM | POA: Diagnosis not present

## 2022-05-13 DIAGNOSIS — N186 End stage renal disease: Secondary | ICD-10-CM | POA: Diagnosis not present

## 2022-05-13 DIAGNOSIS — Z992 Dependence on renal dialysis: Secondary | ICD-10-CM | POA: Diagnosis not present

## 2022-05-13 DIAGNOSIS — N2581 Secondary hyperparathyroidism of renal origin: Secondary | ICD-10-CM | POA: Diagnosis not present

## 2022-05-15 DIAGNOSIS — N2581 Secondary hyperparathyroidism of renal origin: Secondary | ICD-10-CM | POA: Diagnosis not present

## 2022-05-15 DIAGNOSIS — N186 End stage renal disease: Secondary | ICD-10-CM | POA: Diagnosis not present

## 2022-05-15 DIAGNOSIS — Z992 Dependence on renal dialysis: Secondary | ICD-10-CM | POA: Diagnosis not present

## 2022-05-17 DIAGNOSIS — N186 End stage renal disease: Secondary | ICD-10-CM | POA: Diagnosis not present

## 2022-05-17 DIAGNOSIS — Z992 Dependence on renal dialysis: Secondary | ICD-10-CM | POA: Diagnosis not present

## 2022-05-17 DIAGNOSIS — N2581 Secondary hyperparathyroidism of renal origin: Secondary | ICD-10-CM | POA: Diagnosis not present

## 2022-05-20 DIAGNOSIS — N2581 Secondary hyperparathyroidism of renal origin: Secondary | ICD-10-CM | POA: Diagnosis not present

## 2022-05-20 DIAGNOSIS — N186 End stage renal disease: Secondary | ICD-10-CM | POA: Diagnosis not present

## 2022-05-20 DIAGNOSIS — Z992 Dependence on renal dialysis: Secondary | ICD-10-CM | POA: Diagnosis not present

## 2022-05-24 DIAGNOSIS — N186 End stage renal disease: Secondary | ICD-10-CM | POA: Diagnosis not present

## 2022-05-24 DIAGNOSIS — N2581 Secondary hyperparathyroidism of renal origin: Secondary | ICD-10-CM | POA: Diagnosis not present

## 2022-05-24 DIAGNOSIS — Z992 Dependence on renal dialysis: Secondary | ICD-10-CM | POA: Diagnosis not present

## 2022-05-27 DIAGNOSIS — N186 End stage renal disease: Secondary | ICD-10-CM | POA: Diagnosis not present

## 2022-05-27 DIAGNOSIS — Z992 Dependence on renal dialysis: Secondary | ICD-10-CM | POA: Diagnosis not present

## 2022-05-27 DIAGNOSIS — N2581 Secondary hyperparathyroidism of renal origin: Secondary | ICD-10-CM | POA: Diagnosis not present

## 2022-05-29 DIAGNOSIS — N2581 Secondary hyperparathyroidism of renal origin: Secondary | ICD-10-CM | POA: Diagnosis not present

## 2022-05-29 DIAGNOSIS — N186 End stage renal disease: Secondary | ICD-10-CM | POA: Diagnosis not present

## 2022-05-29 DIAGNOSIS — Z992 Dependence on renal dialysis: Secondary | ICD-10-CM | POA: Diagnosis not present

## 2022-05-30 DIAGNOSIS — N186 End stage renal disease: Secondary | ICD-10-CM | POA: Diagnosis not present

## 2022-05-30 DIAGNOSIS — M3214 Glomerular disease in systemic lupus erythematosus: Secondary | ICD-10-CM | POA: Diagnosis not present

## 2022-05-30 DIAGNOSIS — Z992 Dependence on renal dialysis: Secondary | ICD-10-CM | POA: Diagnosis not present

## 2022-06-05 ENCOUNTER — Emergency Department (HOSPITAL_COMMUNITY)
Admission: EM | Admit: 2022-06-05 | Discharge: 2022-06-05 | Disposition: A | Discharge status: Home / Self Care | Payer: Medicare HMO | Attending: Emergency Medicine | Admitting: Emergency Medicine

## 2022-06-05 ENCOUNTER — Encounter (HOSPITAL_COMMUNITY): Payer: Self-pay | Admitting: Emergency Medicine

## 2022-06-05 ENCOUNTER — Other Ambulatory Visit: Payer: Self-pay

## 2022-06-05 DIAGNOSIS — N179 Acute kidney failure, unspecified: Secondary | ICD-10-CM | POA: Insufficient documentation

## 2022-06-05 DIAGNOSIS — Z992 Dependence on renal dialysis: Secondary | ICD-10-CM | POA: Diagnosis not present

## 2022-06-05 DIAGNOSIS — Z79899 Other long term (current) drug therapy: Secondary | ICD-10-CM | POA: Diagnosis not present

## 2022-06-05 DIAGNOSIS — Z7901 Long term (current) use of anticoagulants: Secondary | ICD-10-CM | POA: Diagnosis not present

## 2022-06-05 DIAGNOSIS — I10 Essential (primary) hypertension: Secondary | ICD-10-CM | POA: Diagnosis not present

## 2022-06-05 DIAGNOSIS — R531 Weakness: Secondary | ICD-10-CM | POA: Insufficient documentation

## 2022-06-05 DIAGNOSIS — R110 Nausea: Secondary | ICD-10-CM | POA: Diagnosis present

## 2022-06-05 MED ORDER — CARVEDILOL 12.5 MG PO TABS
25.0000 mg | ORAL_TABLET | Freq: Once | ORAL | Status: AC
Start: 2022-06-05 — End: 2022-06-05
  Administered 2022-06-05: 25 mg via ORAL
  Filled 2022-06-05: qty 2

## 2022-06-05 MED ORDER — AMLODIPINE BESYLATE 5 MG PO TABS
10.0000 mg | ORAL_TABLET | Freq: Once | ORAL | Status: AC
  Administered 2022-06-05: 10 mg via ORAL
  Filled 2022-06-05: qty 2

## 2022-06-05 MED ORDER — HYDRALAZINE HCL 25 MG PO TABS
100.0000 mg | ORAL_TABLET | Freq: Once | ORAL | Status: AC
  Administered 2022-06-05: 100 mg via ORAL
  Filled 2022-06-05: qty 4

## 2022-06-05 MED ORDER — ONDANSETRON 4 MG PO TBDP: ORAL_TABLET | ORAL | 0 refills | Status: DC

## 2022-06-05 NOTE — ED Provider Notes (Signed)
[facility] EMERGENCY DEPARTMENT Provider Note   CSN: [id number] Arrival date & time: 06/05/22  9379     History  No chief complaint on file.   Cathy [name] is a 41 y.o. female.  Patient has a history of hypertension and renal failure.  She missed 2 episodes of dialysis and feels little nauseated.  They sent her over here for evaluation.  Patient states she did not take her blood pressure medicines today  The history is provided by the patient and medical records. No language interpreter was used.  Weakness Severity:  Mild Onset quality:  Gradual Timing:  Constant Progression:  Waxing and waning Chronicity:  Recurrent Context: not alcohol use   Relieved by:  Nothing Worsened by:  Nothing Ineffective treatments:  None tried Associated symptoms: no abdominal pain, no chest pain, no cough, no diarrhea, no frequency, no headaches and no seizures        Home Medications Prior to Admission medications   Medication Sig Start Date End Date Taking? Authorizing Provider  ondansetron (ZOFRAN-ODT) 4 MG disintegrating tablet 4mg  ODT q4 hours prn nausea/vomit 06/05/22  Yes Milton Ferguson, MD  acetaminophen (TYLENOL) 500 MG tablet Take 1,000 mg by mouth every 6 (six) hours as needed for moderate pain.    [provider]  albuterol (VENTOLIN HFA) 108 (90 Base) MCG/ACT inhaler Inhale 2 puffs into the lungs every 6 (six) hours as needed for wheezing or shortness of breath. 12/17/21 12/17/22  Rai, Ripudeep K, MD  amLODipine (NORVASC) 10 MG tablet Take 1 tablet (10 mg total) by mouth daily. 03/05/22   Orson Eva, MD  apixaban (ELIQUIS) 5 MG TABS tablet Take 1 tablet (5 mg total) by mouth 2 (two) times daily. 03/05/22   Orson Eva, MD  Blood Pressure Monitoring (OMRON 3 SERIES BP MONITOR) DEVI Use as directed 2 times a day 01/09/22   Shelly Coss, MD  calcium acetate (PHOSLO) 667 MG capsule Take 2 capsules (1,334 mg total) by mouth 3 (three) times daily with meals. 03/05/22   Orson Eva, MD   carvedilol (COREG) 25 MG tablet Take 1 tablet (25 mg total) by mouth 2 (two) times daily with a meal. 03/05/22   Tat, Shanon Brow, MD  cloNIDine (CATAPRES) 0.2 MG tablet Take 1 tablet (0.2 mg total) by mouth every 8 (eight) hours. 03/05/22   Orson Eva, MD  Doxercalciferol (HECTOROL IV) Doxercalciferol (Hectorol) 03/06/22 03/05/23  [provider]  guaiFENesin (MUCINEX) 600 MG 12 hr tablet Take 600 mg by mouth 2 (two) times daily as needed for cough.    [provider]  hydrALAZINE (APRESOLINE) 100 MG tablet Take 1 tablet (100 mg total) by mouth 3 (three) times daily. 03/05/22   Orson Eva, MD  ipratropium (ATROVENT HFA) 17 MCG/ACT inhaler Inhale 2 puffs into the lungs every 6 (six) hours as needed for wheezing. 12/17/21 12/17/22  Rai, Vernelle Emerald, MD  losartan (COZAAR) 100 MG tablet Take 1 tablet (100 mg total) by mouth at bedtime. 03/05/22   Orson Eva, MD  methocarbamol (ROBAXIN) 500 MG tablet Take 1 tablet (500 mg total) by mouth every 8 (eight) hours as needed for muscle spasms. 03/21/22   Dessa Phi, DO  sevelamer carbonate (RENVELA) 800 MG tablet Take 1 tablet (800 mg total) by mouth 3 (three) times daily. Patient not taking: Reported on 03/17/2022 03/05/22   Orson Eva, MD  fenofibrate 160 MG tablet Take 1 tablet (160 mg total) by mouth daily. 12/31/20 01/20/21  Elmarie Shiley, MD  Allergies    Oxycodone    Review of Systems   Review of Systems  Constitutional:  Negative for appetite change and fatigue.  HENT:  Negative for congestion, ear discharge and sinus pressure.   Eyes:  Negative for discharge.  Respiratory:  Negative for cough.   Cardiovascular:  Negative for chest pain.  Gastrointestinal:  Negative for abdominal pain and diarrhea.  Genitourinary:  Negative for frequency and hematuria.  Musculoskeletal:  Negative for back pain.  Skin:  Negative for rash.  Neurological:  Positive for weakness. Negative for seizures and headaches.  Psychiatric/Behavioral:  Negative for  hallucinations.     Physical Exam Updated Vital Signs BP (!) 177/120 (BP Location: Right Arm)   Pulse 60   Temp 97.9 F (36.6 C) (Oral)   Resp 16   Ht 5\' 7"  (1.702 m)   Wt 59 kg   LMP 06/05/2020   SpO2 100%   BMI 20.36 kg/m  Physical Exam Vitals and nursing note reviewed.  Constitutional:      Appearance: She is well-developed.  HENT:     Head: Normocephalic.     Nose: Nose normal.  Eyes:     General: No scleral icterus.    Conjunctiva/sclera: Conjunctivae normal.  Neck:     Thyroid: No thyromegaly.  Cardiovascular:     Rate and Rhythm: Normal rate and regular rhythm.     Heart sounds: No murmur heard.    No friction rub. No gallop.  Pulmonary:     Breath sounds: No stridor. No wheezing or rales.  Chest:     Chest wall: No tenderness.  Abdominal:     General: There is no distension.     Tenderness: There is no abdominal tenderness. There is no rebound.  Musculoskeletal:        General: Normal range of motion.     Cervical back: Neck supple.  Lymphadenopathy:     Cervical: No cervical adenopathy.  Skin:    Findings: No erythema or rash.  Neurological:     Mental Status: She is alert and oriented to person, place, and time.     Motor: No abnormal muscle tone.     Coordination: Coordination normal.  Psychiatric:        Behavior: Behavior normal.     ED Results / Procedures / Treatments   Labs (all labs ordered are listed, but only abnormal results are displayed) Labs Reviewed - No data to display  EKG None  Radiology No results found.  Procedures Procedures    Medications Ordered in ED Medications  amLODipine (NORVASC) tablet 10 mg (10 mg Oral Given 06/05/22 0748)  carvedilol (COREG) tablet 25 mg (25 mg Oral Given 06/05/22 0749)  hydrALAZINE (APRESOLINE) tablet 100 mg (100 mg Oral Given 06/05/22 0749)    ED Course/ Medical Decision Making/ A&P                           Medical Decision Making Risk Prescription drug management.  Patient with  hypertension and and renal failure.  She is given some Zofran for nausea and sent over to get her dialysis today.  Patient does not show significant signs of fluid overload and she is not hypoxic         Final Clinical Impression(s) / ED Diagnoses Final diagnoses:  Nausea    Rx / DC Orders ED Discharge Orders          Ordered    ondansetron (ZOFRAN-ODT)  4 MG disintegrating tablet        06/05/22 0745              Milton Ferguson, MD 06/06/22 1729

## 2022-06-05 NOTE — ED Triage Notes (Signed)
Pt to the ED after having missed two dialysis treatments. The center is requiring her to be evaluated prior to returning for tratment.  Her normal dialysis days are S,T,R.

## 2022-06-05 NOTE — Discharge Instructions (Signed)
Go directly to dialysis after you leave the hospital today.   Follow up with your md if problems

## 2022-06-06 ENCOUNTER — Other Ambulatory Visit: Payer: Self-pay

## 2022-06-06 ENCOUNTER — Encounter (HOSPITAL_COMMUNITY): Payer: Self-pay | Admitting: Emergency Medicine

## 2022-06-06 ENCOUNTER — Encounter (HOSPITAL_COMMUNITY)
Admission: RE | Admit: 2022-06-06 | Discharge: 2022-06-06 | Disposition: A | Discharge status: Home / Self Care | Payer: Medicare HMO | Source: Ambulatory Visit | Attending: Vascular Surgery | Admitting: Vascular Surgery

## 2022-06-06 ENCOUNTER — Observation Stay (HOSPITAL_COMMUNITY)
Admission: EM | Admit: 2022-06-06 | Discharge: 2022-06-07 | Disposition: A | Discharge status: Home / Self Care | Payer: Medicare HMO | Attending: Family Medicine | Admitting: Family Medicine

## 2022-06-06 DIAGNOSIS — F17200 Nicotine dependence, unspecified, uncomplicated: Secondary | ICD-10-CM

## 2022-06-06 DIAGNOSIS — Z86711 Personal history of pulmonary embolism: Secondary | ICD-10-CM | POA: Insufficient documentation

## 2022-06-06 DIAGNOSIS — I132 Hypertensive heart and chronic kidney disease with heart failure and with stage 5 chronic kidney disease, or end stage renal disease: Secondary | ICD-10-CM | POA: Insufficient documentation

## 2022-06-06 DIAGNOSIS — I5040 Unspecified combined systolic (congestive) and diastolic (congestive) heart failure: Secondary | ICD-10-CM | POA: Insufficient documentation

## 2022-06-06 DIAGNOSIS — F17210 Nicotine dependence, cigarettes, uncomplicated: Secondary | ICD-10-CM | POA: Insufficient documentation

## 2022-06-06 DIAGNOSIS — R1084 Generalized abdominal pain: Secondary | ICD-10-CM | POA: Diagnosis present

## 2022-06-06 DIAGNOSIS — I10 Essential (primary) hypertension: Secondary | ICD-10-CM | POA: Diagnosis present

## 2022-06-06 DIAGNOSIS — I160 Hypertensive urgency: Secondary | ICD-10-CM | POA: Diagnosis not present

## 2022-06-06 DIAGNOSIS — M3214 Glomerular disease in systemic lupus erythematosus: Secondary | ICD-10-CM | POA: Diagnosis present

## 2022-06-06 DIAGNOSIS — Z720 Tobacco use: Secondary | ICD-10-CM | POA: Diagnosis present

## 2022-06-06 DIAGNOSIS — Z7901 Long term (current) use of anticoagulants: Secondary | ICD-10-CM | POA: Insufficient documentation

## 2022-06-06 DIAGNOSIS — E875 Hyperkalemia: Secondary | ICD-10-CM | POA: Diagnosis not present

## 2022-06-06 DIAGNOSIS — Z01818 Encounter for other preprocedural examination: Secondary | ICD-10-CM

## 2022-06-06 DIAGNOSIS — J45909 Unspecified asthma, uncomplicated: Secondary | ICD-10-CM | POA: Diagnosis present

## 2022-06-06 DIAGNOSIS — E8770 Fluid overload, unspecified: Secondary | ICD-10-CM | POA: Diagnosis present

## 2022-06-06 DIAGNOSIS — N186 End stage renal disease: Secondary | ICD-10-CM

## 2022-06-06 DIAGNOSIS — I5020 Unspecified systolic (congestive) heart failure: Secondary | ICD-10-CM | POA: Diagnosis present

## 2022-06-06 DIAGNOSIS — Z992 Dependence on renal dialysis: Secondary | ICD-10-CM | POA: Diagnosis not present

## 2022-06-06 DIAGNOSIS — Z79899 Other long term (current) drug therapy: Secondary | ICD-10-CM | POA: Insufficient documentation

## 2022-06-06 LAB — CBC WITH DIFFERENTIAL/PLATELET
Abs Immature Granulocytes: 0.02 10*3/uL (ref 0.00–0.07)
Basophils Absolute: 0 10*3/uL (ref 0.0–0.1)
Basophils Relative: 1 %
Eosinophils Absolute: 0.2 10*3/uL (ref 0.0–0.5)
Eosinophils Relative: 5 %
HCT: 36.1 % (ref 36.0–46.0)
Hemoglobin: 11.3 g/dL — ABNORMAL LOW (ref 12.0–15.0)
Immature Granulocytes: 0 %
Lymphocytes Relative: 18 %
Lymphs Abs: 0.9 10*3/uL (ref 0.7–4.0)
MCH: 28.2 pg (ref 26.0–34.0)
MCHC: 31.3 g/dL (ref 30.0–36.0)
MCV: 90 fL (ref 80.0–100.0)
Monocytes Absolute: 0.7 10*3/uL (ref 0.1–1.0)
Monocytes Relative: 13 %
Neutro Abs: 3.1 10*3/uL (ref 1.7–7.7)
Neutrophils Relative %: 63 %
Platelets: 284 10*3/uL (ref 150–400)
RBC: 4.01 MIL/uL (ref 3.87–5.11)
RDW: 17.9 % — ABNORMAL HIGH (ref 11.5–15.5)
WBC: 5 10*3/uL (ref 4.0–10.5)
nRBC: 0 % (ref 0.0–0.2)

## 2022-06-06 LAB — BASIC METABOLIC PANEL
Anion gap: 14 (ref 5–15)
BUN: 108 mg/dL — ABNORMAL HIGH (ref 6–20)
CO2: 21 mmol/L — ABNORMAL LOW (ref 22–32)
Calcium: 8.4 mg/dL — ABNORMAL LOW (ref 8.9–10.3)
Chloride: 104 mmol/L (ref 98–111)
Creatinine, Ser: 18.8 mg/dL — ABNORMAL HIGH (ref 0.44–1.00)
GFR, Estimated: 2 mL/min — ABNORMAL LOW (ref >60)
Glucose, Bld: 97 mg/dL (ref 70–99)
Potassium: 7 mmol/L (ref 3.5–5.1)
Sodium: 139 mmol/L (ref 135–145)

## 2022-06-06 LAB — HEPATITIS C ANTIBODY: HCV Ab: NONREACTIVE

## 2022-06-06 LAB — HEPATITIS B CORE ANTIBODY, TOTAL: Hep B Core Total Ab: NONREACTIVE

## 2022-06-06 LAB — HEPATITIS B SURFACE ANTIBODY,QUALITATIVE: Hep B S Ab: REACTIVE — AB

## 2022-06-06 LAB — HEPATITIS B SURFACE ANTIGEN
Hepatitis B Surface Ag: NONREACTIVE
Hepatitis B Surface Ag: NONREACTIVE

## 2022-06-06 MED ORDER — ONDANSETRON 4 MG PO TBDP
4.0000 mg | ORAL_TABLET | Freq: Once | ORAL | Status: AC
  Administered 2022-06-06: 4 mg via ORAL
  Filled 2022-06-06: qty 1

## 2022-06-06 MED ORDER — CARVEDILOL 12.5 MG PO TABS
25.0000 mg | ORAL_TABLET | Freq: Two times a day (BID) | ORAL | Status: DC
  Administered 2022-06-06 – 2022-06-07 (×3): 25 mg via ORAL
  Filled 2022-06-06 (×3): qty 2

## 2022-06-06 MED ORDER — CLONIDINE HCL 0.2 MG PO TABS
0.2000 mg | ORAL_TABLET | Freq: Once | ORAL | Status: AC
  Administered 2022-06-06: 0.2 mg via ORAL
  Filled 2022-06-06: qty 1

## 2022-06-06 MED ORDER — ALBUTEROL SULFATE (2.5 MG/3ML) 0.083% IN NEBU: 3.0000 mL | INHALATION_SOLUTION | Freq: Four times a day (QID) | RESPIRATORY_TRACT | Status: DC | PRN

## 2022-06-06 MED ORDER — CALCIUM ACETATE (PHOS BINDER) 667 MG PO CAPS
1334.0000 mg | ORAL_CAPSULE | Freq: Three times a day (TID) | ORAL | Status: DC
  Administered 2022-06-06 – 2022-06-07 (×3): 1334 mg via ORAL
  Filled 2022-06-06 (×3): qty 2

## 2022-06-06 MED ORDER — ACETAMINOPHEN 325 MG PO TABS
650.0000 mg | ORAL_TABLET | Freq: Four times a day (QID) | ORAL | Status: DC | PRN
  Administered 2022-06-07: 650 mg via ORAL
  Filled 2022-06-06: qty 2

## 2022-06-06 MED ORDER — SODIUM ZIRCONIUM CYCLOSILICATE 5 G PO PACK
10.0000 g | PACK | Freq: Once | ORAL | Status: AC
  Administered 2022-06-06: 10 g via ORAL
  Filled 2022-06-06: qty 2

## 2022-06-06 MED ORDER — GUAIFENESIN ER 600 MG PO TB12: 600.0000 mg | ORAL_TABLET | Freq: Two times a day (BID) | ORAL | Status: DC | PRN

## 2022-06-06 MED ORDER — HYDROMORPHONE HCL 1 MG/ML IJ SOLN
0.5000 mg | Freq: Once | INTRAMUSCULAR | Status: AC
  Administered 2022-06-06: 0.5 mg via INTRAVENOUS
  Filled 2022-06-06: qty 0.5

## 2022-06-06 MED ORDER — HYDRALAZINE HCL 25 MG PO TABS
100.0000 mg | ORAL_TABLET | Freq: Once | ORAL | Status: AC
  Administered 2022-06-06: 100 mg via ORAL
  Filled 2022-06-06: qty 4

## 2022-06-06 MED ORDER — CHLORHEXIDINE GLUCONATE CLOTH 2 % EX PADS
6.0000 | MEDICATED_PAD | Freq: Every day | CUTANEOUS | Status: DC
  Administered 2022-06-07: 6 via TOPICAL

## 2022-06-06 MED ORDER — HYDRALAZINE HCL 20 MG/ML IJ SOLN: 10.0000 mg | INTRAMUSCULAR | Status: DC | PRN

## 2022-06-06 MED ORDER — SODIUM CHLORIDE 0.9 % IV SOLN
125.0000 mg | INTRAVENOUS | Status: DC
  Administered 2022-06-06: 125 mg via INTRAVENOUS
  Filled 2022-06-06: qty 10

## 2022-06-06 MED ORDER — ONDANSETRON HCL 4 MG PO TABS: 4.0000 mg | ORAL_TABLET | Freq: Four times a day (QID) | ORAL | Status: DC | PRN

## 2022-06-06 MED ORDER — PENTAFLUOROPROP-TETRAFLUOROETH EX AERO: 1.0000 | INHALATION_SPRAY | CUTANEOUS | Status: DC | PRN

## 2022-06-06 MED ORDER — ACETAMINOPHEN 500 MG PO TABS
1000.0000 mg | ORAL_TABLET | Freq: Four times a day (QID) | ORAL | Status: DC | PRN
  Administered 2022-06-06 – 2022-06-07 (×3): 1000 mg via ORAL
  Filled 2022-06-06 (×3): qty 2

## 2022-06-06 MED ORDER — CLONIDINE HCL 0.2 MG PO TABS
0.2000 mg | ORAL_TABLET | Freq: Three times a day (TID) | ORAL | Status: DC
  Administered 2022-06-06 – 2022-06-07 (×3): 0.2 mg via ORAL
  Filled 2022-06-06 (×4): qty 1

## 2022-06-06 MED ORDER — DOXERCALCIFEROL 4 MCG/2ML IV SOLN
2.0000 ug | INTRAVENOUS | Status: DC
  Filled 2022-06-06 (×2): qty 2

## 2022-06-06 MED ORDER — ACETAMINOPHEN 650 MG RE SUPP: 650.0000 mg | Freq: Four times a day (QID) | RECTAL | Status: DC | PRN

## 2022-06-06 MED ORDER — HYDRALAZINE HCL 25 MG PO TABS
100.0000 mg | ORAL_TABLET | Freq: Three times a day (TID) | ORAL | Status: DC
  Administered 2022-06-06 – 2022-06-07 (×3): 100 mg via ORAL
  Filled 2022-06-06 (×3): qty 4

## 2022-06-06 MED ORDER — DIPHENHYDRAMINE HCL 50 MG/ML IJ SOLN
INTRAMUSCULAR | Status: AC
  Administered 2022-06-06: 12.5 mg via INTRAVENOUS
  Filled 2022-06-06: qty 1

## 2022-06-06 MED ORDER — AMLODIPINE BESYLATE 5 MG PO TABS
10.0000 mg | ORAL_TABLET | Freq: Once | ORAL | Status: AC
  Administered 2022-06-06: 10 mg via ORAL
  Filled 2022-06-06: qty 2

## 2022-06-06 MED ORDER — IPRATROPIUM BROMIDE 0.02 % IN SOLN: 2.5000 mL | Freq: Four times a day (QID) | RESPIRATORY_TRACT | Status: DC | PRN

## 2022-06-06 MED ORDER — APIXABAN 5 MG PO TABS
5.0000 mg | ORAL_TABLET | Freq: Two times a day (BID) | ORAL | Status: DC
  Administered 2022-06-06 – 2022-06-07 (×2): 5 mg via ORAL
  Filled 2022-06-06 (×2): qty 1

## 2022-06-06 MED ORDER — ONDANSETRON HCL 4 MG/2ML IJ SOLN
4.0000 mg | Freq: Four times a day (QID) | INTRAMUSCULAR | Status: DC | PRN
  Administered 2022-06-06: 4 mg via INTRAVENOUS
  Filled 2022-06-06: qty 2

## 2022-06-06 MED ORDER — DOXERCALCIFEROL 4 MCG/2ML IV SOLN
INTRAVENOUS | Status: AC
  Administered 2022-06-06: 2 ug via INTRAVENOUS
  Filled 2022-06-06: qty 2

## 2022-06-06 MED ORDER — ANTICOAGULANT SODIUM CITRATE 4% (200MG/5ML) IV SOLN
5.0000 mL | Status: DC | PRN
  Filled 2022-06-06: qty 5

## 2022-06-06 MED ORDER — HEPARIN SODIUM (PORCINE) 1000 UNIT/ML DIALYSIS: 1000.0000 [IU] | INTRAMUSCULAR | Status: DC | PRN

## 2022-06-06 MED ORDER — AMLODIPINE BESYLATE 5 MG PO TABS
10.0000 mg | ORAL_TABLET | Freq: Every day | ORAL | Status: DC
  Administered 2022-06-07: 10 mg via ORAL
  Filled 2022-06-06: qty 2

## 2022-06-06 MED ORDER — LIDOCAINE-PRILOCAINE 2.5-2.5 % EX CREA: 1.0000 | TOPICAL_CREAM | CUTANEOUS | Status: DC | PRN

## 2022-06-06 MED ORDER — LIDOCAINE HCL (PF) 1 % IJ SOLN: 5.0000 mL | INTRAMUSCULAR | Status: DC | PRN

## 2022-06-06 MED ORDER — DIPHENHYDRAMINE HCL 50 MG/ML IJ SOLN: 12.5000 mg | INTRAMUSCULAR | Status: DC | PRN

## 2022-06-06 MED ORDER — SODIUM BICARBONATE 8.4 % IV SOLN: 50.0000 meq | Freq: Once | INTRAVENOUS | Status: DC

## 2022-06-06 MED ORDER — ALTEPLASE 2 MG IJ SOLR
2.0000 mg | Freq: Once | INTRAMUSCULAR | Status: DC | PRN
  Filled 2022-06-06: qty 2

## 2022-06-06 MED ORDER — HEPARIN SODIUM (PORCINE) 1000 UNIT/ML IJ SOLN
INTRAMUSCULAR | Status: AC
  Administered 2022-06-06: 3200 [IU]
  Filled 2022-06-06: qty 6

## 2022-06-06 MED ORDER — LOSARTAN POTASSIUM 50 MG PO TABS
100.0000 mg | ORAL_TABLET | Freq: Every day | ORAL | Status: DC
  Administered 2022-06-06: 100 mg via ORAL
  Filled 2022-06-06: qty 2

## 2022-06-06 MED ORDER — CARVEDILOL 12.5 MG PO TABS
25.0000 mg | ORAL_TABLET | Freq: Once | ORAL | Status: AC
  Administered 2022-06-06: 25 mg via ORAL
  Filled 2022-06-06: qty 2

## 2022-06-06 NOTE — Assessment & Plan Note (Signed)
-   Stable no acute symptoms resume home bronchodilators

## 2022-06-06 NOTE — H&P (Signed)
History and Physical  Cathy [name]:[id number] DOB: 04/01/1981 DOA: 06/06/2022  PCP: Gildardo Pounds, NP  Patient coming from: home  Level of care: Telemetry  I have personally briefly reviewed patient's old medical records in [place]  Chief Complaint: missed dialysis   HPI: Cathy [name] is a 41 year old female with history of end-stage renal disease on hemodialysis TTS, history of MSSA bacteremia, systolic and diastolic heart failure, SLE with history of pericarditis, multiple hospitalizations for noncompliance with hemodialysis treatments, etc.  Patient presented to hemodialysis yesterday after having missed 2 prior sessions and they required that she have labs done prior to treatment.  She ended up coming to the ED today and noted to have hypertensive urgency, volume overload and severe hyperkalemia with EKG changes.  Nephrology was consulted for emergency hemodialysis.  The patient was given bicarbonate, Lokelma as a temporizing measure until she can have hemodialysis treatment.  She is going to be admitted for overnight observation.  She has complaints of diarrhea and generalized abdominal pain and abdominal bloating secondary to volume overload which is typical to how she feels when missing hemodialysis treatments.  She denies fever and chills at this time.  She denies chest pain symptoms.  Review of Systems: Review of Systems  Constitutional:  Positive for malaise/fatigue. Negative for weight loss.  HENT: Negative.    Eyes: Negative.   Respiratory:  Positive for shortness of breath.   Cardiovascular:  Positive for leg swelling and PND. Negative for chest pain and claudication.  Gastrointestinal:  Positive for abdominal pain, diarrhea and nausea.  Genitourinary: Negative.   Musculoskeletal:  Positive for myalgias. Negative for falls.  Skin:  Positive for itching.  Neurological: Negative.   Endo/Heme/Allergies: Negative.   Psychiatric/Behavioral:  Negative.    All other systems reviewed and are negative.    Past Medical History:  Diagnosis Date   Anasarca associated with disorder of kidney 06/08/2021   Anxiety    Asthma    Bipolar depression ([place])    Depression    Dyspnea    ESRD (end stage renal disease) on dialysis ([place]) 10/2020   GERD (gastroesophageal reflux disease)    Gonorrhea    Lupus (HCC)    Pelvic inflammatory disease (PID)    Pleural effusion 06/08/2021   Pulmonary embolism ([place]) 01/31/2022   Renal hypertension    Trichomonas infection     Past Surgical History:  Procedure Laterality Date   IR FLUORO GUIDE CV LINE RIGHT  11/30/2020   IR FLUORO GUIDE CV LINE RIGHT  06/13/2021   IR FLUORO GUIDE CV LINE RIGHT  02/01/2022   IR REMOVAL TUN CV CATH W/O FL  06/11/2021   IR REMOVAL TUN CV CATH W/O FL  01/29/2022   IR US GUIDE VASC ACCESS RIGHT  11/30/2020   IR US GUIDE VASC ACCESS RIGHT  06/13/2021   IR US GUIDE VASC ACCESS RIGHT  02/03/2022   RENAL BIOPSY     TEE WITHOUT CARDIOVERSION N/A 06/13/2021   Procedure: TRANSESOPHAGEAL ECHOCARDIOGRAM (TEE);  Surgeon: Josue Hector, MD;  Location: [facility] ENDOSCOPY;  Service: Cardiovascular;  Laterality: N/A;   TEE WITHOUT CARDIOVERSION N/A 02/03/2022   Procedure: TRANSESOPHAGEAL ECHOCARDIOGRAM (TEE);  Surgeon: Berniece Salines, DO;  Location: [facility];  Service: Cardiovascular;  Laterality: N/A;   TUBAL LIGATION N/A 02/03/2019   Procedure: POST PARTUM TUBAL LIGATION;  Surgeon: Aletha Halim, MD;  Location: [facility] LD ORS;  Service: Gynecology;  Laterality: N/A;     reports  that she has been smoking cigarettes. She has never used smokeless tobacco. She reports that she does not currently use alcohol. She reports that she does not currently use drugs after having used the following drugs: Marijuana.  Allergies  Allergen Reactions   Oxycodone Itching    Family History  Problem Relation Age of Onset   Diabetes Maternal Grandmother    Hypertension Maternal Grandmother    Diabetes  Maternal Grandfather    Hypertension Maternal Grandfather    Diabetes Paternal Grandmother    Hypertension Paternal Grandmother    Diabetes Paternal Grandfather    Hypertension Paternal Grandfather     Prior to Admission medications   Medication Sig Start Date End Date Taking? Authorizing Provider  acetaminophen (TYLENOL) 500 MG tablet Take 1,000 mg by mouth every 6 (six) hours as needed for moderate pain.    [provider]  albuterol (VENTOLIN HFA) 108 (90 Base) MCG/ACT inhaler Inhale 2 puffs into the lungs every 6 (six) hours as needed for wheezing or shortness of breath. 12/17/21 12/17/22  Rai, Ripudeep K, MD  amLODipine (NORVASC) 10 MG tablet Take 1 tablet (10 mg total) by mouth daily. 03/05/22   Orson Eva, MD  apixaban (ELIQUIS) 5 MG TABS tablet Take 1 tablet (5 mg total) by mouth 2 (two) times daily. 03/05/22   Orson Eva, MD  Blood Pressure Monitoring (OMRON 3 SERIES BP MONITOR) DEVI Use as directed 2 times a day 01/09/22   Shelly Coss, MD  calcium acetate (PHOSLO) 667 MG capsule Take 2 capsules (1,334 mg total) by mouth 3 (three) times daily with meals. 03/05/22   Orson Eva, MD  carvedilol (COREG) 25 MG tablet Take 1 tablet (25 mg total) by mouth 2 (two) times daily with a meal. 03/05/22   Tat, Shanon Brow, MD  cloNIDine (CATAPRES) 0.2 MG tablet Take 1 tablet (0.2 mg total) by mouth every 8 (eight) hours. 03/05/22   Orson Eva, MD  Doxercalciferol (HECTOROL IV) Doxercalciferol (Hectorol) 03/06/22 03/05/23  [provider]  guaiFENesin (MUCINEX) 600 MG 12 hr tablet Take 600 mg by mouth 2 (two) times daily as needed for cough.    [provider]  hydrALAZINE (APRESOLINE) 100 MG tablet Take 1 tablet (100 mg total) by mouth 3 (three) times daily. 03/05/22   Orson Eva, MD  ipratropium (ATROVENT HFA) 17 MCG/ACT inhaler Inhale 2 puffs into the lungs every 6 (six) hours as needed for wheezing. 12/17/21 12/17/22  Rai, Vernelle Emerald, MD  losartan (COZAAR) 100 MG tablet Take 1 tablet (100 mg  total) by mouth at bedtime. 03/05/22   Orson Eva, MD  methocarbamol (ROBAXIN) 500 MG tablet Take 1 tablet (500 mg total) by mouth every 8 (eight) hours as needed for muscle spasms. 03/21/22   Dessa Phi, DO  ondansetron (ZOFRAN-ODT) 4 MG disintegrating tablet 4mg  ODT q4 hours prn nausea/vomit 06/05/22   Milton Ferguson, MD  sevelamer carbonate (RENVELA) 800 MG tablet Take 1 tablet (800 mg total) by mouth 3 (three) times daily. Patient not taking: Reported on 03/17/2022 03/05/22   Orson Eva, MD  fenofibrate 160 MG tablet Take 1 tablet (160 mg total) by mouth daily. 12/31/20 01/20/21  Elmarie Shiley, MD    Physical Exam: Vitals:   06/06/22 1415 06/06/22 1430 06/06/22 1445 06/06/22 1500  BP: (!) 177/101 (!) 172/110 (!) 163/112 (!) 174/105  Pulse: (!) 58 (!) 56 (!) 55 (!) 56  Resp: 15 16 18 16   Temp:      TempSrc:  SpO2:      Weight:      Height:        Constitutional: chronically ill appearing female, appears volume overloaded, NAD, calm, Uncomfortable Eyes: PERRL, lids and conjunctivae normal ENMT: Mucous membranes are moist. Posterior pharynx clear of any exudate or lesions.  Neck: normal, supple, no masses, no thyromegaly Respiratory: bibasilar crackles. Normal respiratory effort. No accessory muscle use.  Cardiovascular: normal s1, s2 sounds, 1+ pretibial bilateral extremity edema. 2+ pedal pulses. No carotid bruits.  Abdomen: mild tenderness, moderate distension, no masses palpated. No hepatosplenomegaly. Bowel sounds positive.  Musculoskeletal: no clubbing / cyanosis. No joint deformity upper and lower extremities. Good ROM, no contractures. Normal muscle tone.  Skin: no rashes, lesions, ulcers. No induration Neurologic: CN 2-12 grossly intact. Sensation intact, DTR normal. Strength 5/5 in all 4.  Psychiatric: Poor judgment and insight. Alert and oriented x 3. Normal mood.   Labs on Admission: I have personally reviewed following labs and imaging studies  CBC: Recent Labs   Lab 06/06/22 1040  WBC 5.0  NEUTROABS 3.1  HGB 11.3*  HCT 36.1  MCV 90.0  PLT 409   Basic Metabolic Panel: Recent Labs  Lab 06/06/22 1040  NA 139  K 7.0*  CL 104  CO2 21*  GLUCOSE 97  BUN 108*  CREATININE 18.80*  CALCIUM 8.4*   GFR: Estimated Creatinine Clearance: 3.9 mL/min (A) (by C-G formula based on SCr of 18.8 mg/dL (H)). Liver Function Tests: No results for input(s): "AST", "ALT", "ALKPHOS", "BILITOT", "PROT", "ALBUMIN" in the last 168 hours. No results for input(s): "LIPASE", "AMYLASE" in the last 168 hours. No results for input(s): "AMMONIA" in the last 168 hours. Coagulation Profile: No results for input(s): "INR", "PROTIME" in the last 168 hours. Cardiac Enzymes: No results for input(s): "CKTOTAL", "CKMB", "CKMBINDEX", "TROPONINI" in the last 168 hours. BNP (last 3 results) No results for input(s): "PROBNP" in the last 8760 hours. HbA1C: No results for input(s): "HGBA1C" in the last 72 hours. CBG: No results for input(s): "GLUCAP" in the last 168 hours. Lipid Profile: No results for input(s): "CHOL", "HDL", "LDLCALC", "TRIG", "CHOLHDL", "LDLDIRECT" in the last 72 hours. Thyroid Function Tests: No results for input(s): "TSH", "T4TOTAL", "FREET4", "T3FREE", "THYROIDAB" in the last 72 hours. Anemia Panel: No results for input(s): "VITAMINB12", "FOLATE", "FERRITIN", "TIBC", "IRON", "RETICCTPCT" in the last 72 hours. Urine analysis:    Component Value Date/Time   COLORURINE YELLOW 11/29/2020 2054   APPEARANCEUR HAZY (A) 11/29/2020 2054   LABSPEC 1.015 11/29/2020 2054   PHURINE 6.0 11/29/2020 2054   GLUCOSEU NEGATIVE 11/29/2020 2054   HGBUR MODERATE (A) 11/29/2020 2054   [place] NEGATIVE 11/29/2020 2054   [place] 11/29/2020 2054   PROTEINUR >=300 (A) 11/29/2020 2054   UROBILINOGEN 0.2 10/17/2020 1026   NITRITE NEGATIVE 11/29/2020 2054   LEUKOCYTESUR NEGATIVE 11/29/2020 2054    Radiological Exams on Admission: No results found.  EKG:  Independently reviewed. Peaked T waves seen.   Assessment/Plan Principal Problem:   Acute hyperkalemia Active Problems:   ESRD on hemodialysis (HCC)   Malignant hypertension   Hypertensive urgency   Volume overload   Asthma   Lupus nephritis (HCC)   HFrEF (heart failure with reduced ejection fraction) (HCC)   Tobacco dependence   Assessment and Plan: * Acute hyperkalemia - Treated with Lokelma and bicarbonate and plan for emergency hemodialysis today by Dr. Hollie Salk  Volume overload - Volume removal with emergency hemodialysis treatment today  Hypertensive urgency - Secondary to volume overload which will  be treated with hemodialysis.  We have resumed her home blood pressure medications and IV hydralazine ordered as needed for elevated blood pressure readings but anticipate blood pressure will improve with hemodialysis treatment  Malignant hypertension - Long history of poor compliance with taking blood pressure medications. - BP elevation is secondary to volume overload from missing 3 dialysis treatments -Anticipate blood pressure will improve with volume removal during hemodialysis  ESRD on hemodialysis [facility]) - Nephrology arranging for emergency hemodialysis treatment today for volume removal and correction of electrolytes  Tobacco dependence - We will offer nicotine patch while in hospital  HFrEF (heart failure with reduced ejection fraction) ([place]) - Volume removal with hemodialysis as noted  Lupus nephritis ([place]) - Stable outpatient follow-up with nephrology  Asthma - Stable no acute symptoms resume home bronchodilators   DVT prophylaxis: [place] heparin  Code Status: Full   Family Communication: none present   Disposition Plan: home   Consults called: nephrology   Admission status: OBS  Level of care: Telemetry Irwin Brakeman MD [facility] How to contact the [facility] Attending or Consulting provider [place] or covering provider during after hours [place], for  this patient?  Check the care team in [facility] and look for a) attending/consulting TR[facility] provider listed and b) the [facility] team listed Log into [email or web address] and use [place]'s universal password to access. If you do not have the password, please contact the hospital operator. Locate the [facility] Psychiatric provider you are looking for under [facility] and page to a number that you can be directly reached. If you still have difficulty reaching the provider, please page the [facility] (Director on Call) for the Hospitalists listed on amion for assistance.   If 7PM-7AM, please contact night-coverage [email or web address] Password [email or web address]  06/06/2022, 3:03 PM

## 2022-06-06 NOTE — Assessment & Plan Note (Addendum)
-   Long history of poor compliance with taking blood pressure medications. - BP elevation is secondary to volume overload from missing 3 dialysis treatments -Blood pressure markedly improved with volume removal during hemodialysis Pt had a treatment 7/7 and 7/8 and feeling much better.

## 2022-06-06 NOTE — Progress Notes (Signed)
  Transition of Care [facility]) Screening Note   Patient Details  Name: Cathy [name] Date of Birth: [birth year]   Transition of [facility]) CM/SW Contact:    Iona Beard, [place] Phone Number: 06/06/2022, 12:20 PM    Transition of Care Department [facility]) has reviewed patient and no TOC needs have been identified at this time. We will continue to monitor patient advancement through interdisciplinary progression rounds. If new patient transition needs arise, please place a TOC consult.

## 2022-06-06 NOTE — Consult Note (Signed)
Reason for Consult: To manage dialysis and dialysis related needs  Referring Physician: Dr Geraldine Solar is an 41 y.o. female.   HPI: Pt is a 65F with a PMH sig for HTN, lupus, ESRD on HD at [facility], and h/o MSSA bacteremia x 2 who is now seen in consultation at the request of Dr Roderic Palau for evaluation and recommendations surrounding ESRD and hyperkalemia.    Pt has a history of missing dialysis rx.  She missed several dialysis rx and when she presented to her HD unit she was asked to go to ED for evaluation.  She did but since labs weren't drawn was asked to return.  She is here today.  K is 7.0.  In this setting we are asked to see.    Reports some diarrhea and vague abdominal pain.  No CP, Sob, LE edema.  Has abd bloating.  Dialyzes at [facility] TTS 4 hrs EDW 55.5 kg F 180 BFR 400 mL/min DFR 800 mL/min 2k/ 2 ca bath Hectorol 2 mcg q rx Venofer 100 mg q rx (hasn't started yet) Mircera 200 mg q 2 weeks  Past Medical History:  Diagnosis Date   Anasarca associated with disorder of kidney 06/08/2021   Anxiety    Asthma    Bipolar depression (HCC)    Depression    Dyspnea    ESRD (end stage renal disease) on dialysis ([place]) 10/2020   GERD (gastroesophageal reflux disease)    Gonorrhea    Lupus (HCC)    Pelvic inflammatory disease (PID)    Pleural effusion 06/08/2021   Pulmonary embolism ([place]) 01/31/2022   Renal hypertension    Trichomonas infection     Past Surgical History:  Procedure Laterality Date   IR FLUORO GUIDE CV LINE RIGHT  11/30/2020   IR FLUORO GUIDE CV LINE RIGHT  06/13/2021   IR FLUORO GUIDE CV LINE RIGHT  02/01/2022   IR REMOVAL TUN CV CATH W/O FL  06/11/2021   IR REMOVAL TUN CV CATH W/O FL  01/29/2022   IR US GUIDE VASC ACCESS RIGHT  11/30/2020   IR US GUIDE VASC ACCESS RIGHT  06/13/2021   IR US GUIDE VASC ACCESS RIGHT  02/03/2022   RENAL BIOPSY     TEE WITHOUT CARDIOVERSION N/A 06/13/2021   Procedure: TRANSESOPHAGEAL ECHOCARDIOGRAM (TEE);  Surgeon: Josue Hector, MD;  Location: [facility] ENDOSCOPY;  Service: Cardiovascular;  Laterality: N/A;   TEE WITHOUT CARDIOVERSION N/A 02/03/2022   Procedure: TRANSESOPHAGEAL ECHOCARDIOGRAM (TEE);  Surgeon: Berniece Salines, DO;  Location: [place];  Service: Cardiovascular;  Laterality: N/A;   TUBAL LIGATION N/A 02/03/2019   Procedure: POST PARTUM TUBAL LIGATION;  Surgeon: Aletha Halim, MD;  Location: [facility] LD ORS;  Service: Gynecology;  Laterality: N/A;    Family History  Problem Relation Age of Onset   Diabetes Maternal Grandmother    Hypertension Maternal Grandmother    Diabetes Maternal Grandfather    Hypertension Maternal Grandfather    Diabetes Paternal Grandmother    Hypertension Paternal Grandmother    Diabetes Paternal Grandfather    Hypertension Paternal Grandfather     Social History:  reports that she has been smoking cigarettes. She has never used smokeless tobacco. She reports that she does not currently use alcohol. She reports that she does not currently use drugs after having used the following drugs: Marijuana.  Allergies:  Allergies  Allergen Reactions   Oxycodone Itching    Medications: Scheduled:  [START ON 06/07/2022] amLODipine  10 mg Oral  Daily   apixaban  5 mg Oral BID   calcium acetate  1,334 mg Oral TID WC   carvedilol  25 mg Oral Once   carvedilol  25 mg Oral BID WC   Chlorhexidine Gluconate Cloth  6 each Topical Q0600   cloNIDine  0.2 mg Oral Once   cloNIDine  0.2 mg Oral Q8H   hydrALAZINE  100 mg Oral TID   losartan  100 mg Oral QHS   sodium bicarbonate  50 mEq Intravenous Once    Results for orders placed or performed during the hospital encounter of 06/06/22 (from the past 48 hour(s))  Basic metabolic panel     Status: Abnormal   Collection Time: 06/06/22 10:40 AM  Result Value Ref Range   Sodium 139 135 - 145 mmol/L   Potassium 7.0 (HH) 3.5 - 5.1 mmol/L    Comment: CRITICAL RESULT CALLED TO, READ BACK BY AND VERIFIED WITH: TALBOTT,T AT 10:50AM ON 06/06/22 BY  FESTERMAN,C    Chloride 104 98 - 111 mmol/L   CO2 21 (L) 22 - 32 mmol/L   Glucose, Bld 97 70 - 99 mg/dL    Comment: Glucose reference range applies only to samples taken after fasting for at least 8 hours.   BUN 108 (H) 6 - 20 mg/dL    Comment: RESULTS CONFIRMED BY MANUAL DILUTION   Creatinine, Ser 18.80 (H) 0.44 - 1.00 mg/dL   Calcium 8.4 (L) 8.9 - 10.3 mg/dL   GFR, Estimated 2 (L) >60 mL/min    Comment: (NOTE) Calculated using the CKD-EPI Creatinine Equation (2021)    Anion gap 14 5 - 15    Comment: Performed at [facility], [address] [place], [place] [address]  CBC with Differential     Status: Abnormal   Collection Time: 06/06/22 10:40 AM  Result Value Ref Range   WBC 5.0 4.0 - 10.5 K/uL   RBC 4.01 3.87 - 5.11 MIL/uL   Hemoglobin 11.3 (L) 12.0 - 15.0 g/dL   HCT 36.1 36.0 - 46.0 %   MCV 90.0 80.0 - 100.0 fL   MCH 28.2 26.0 - 34.0 pg   MCHC 31.3 30.0 - 36.0 g/dL   RDW 17.9 (H) 11.5 - 15.5 %   Platelets 284 150 - 400 K/uL   nRBC 0.0 0.0 - 0.2 %   Neutrophils Relative % 63 %   Neutro Abs 3.1 1.7 - 7.7 K/uL   Lymphocytes Relative 18 %   Lymphs Abs 0.9 0.7 - 4.0 K/uL   Monocytes Relative 13 %   Monocytes Absolute 0.7 0.1 - 1.0 K/uL   Eosinophils Relative 5 %   Eosinophils Absolute 0.2 0.0 - 0.5 K/uL   Basophils Relative 1 %   Basophils Absolute 0.0 0.0 - 0.1 K/uL   Immature Granulocytes 0 %   Abs Immature Granulocytes 0.02 0.00 - 0.07 K/uL    Comment: Performed at [facility], [address], [place], [place] [address]    No results found.  ROS: all other systems reviewed and are negative except as per HPI Blood pressure (!) 186/121, pulse 60, height 5\' 7"  (1.702 m), weight 59 kg, last menstrual period 06/05/2020, SpO2 100 %, unknown if currently breastfeeding. Marland Kitchen GEN sitting in bed, NAD HEENT EOMI PERRL NECK no JVD PULM clear bilaterally CV RRR, loud S2 ABD soft, + distended with fluid wave EXT no LE edema NEURO AAO x 3 nonfocal SKIN no  rashes  Assessment/Plan: 1 hyperkalemia: d/t noncompliance with dialysis.  Will dialyze  urgently here, 1 hr on 1K bath and the rest on 2K.  Lokelma and bicarb in the meantime.  2 ESRD: TTS at [facility].   3 Hypertension:  expect to improve with UF 4. Anemia of ESRD:  will give ferrlicet with HD 5. Metabolic Bone Disease: will give hectorol with HD, binders when eating 6. Dispo: OBS  Heidie Krall 06/06/2022, 11:45 AM

## 2022-06-06 NOTE — Assessment & Plan Note (Addendum)
-   Treated with Lokelma and bicarbonate and plan for emergency hemodialysis today by Dr. Hollie Salk

## 2022-06-06 NOTE — Hospital Course (Signed)
41 year old female with history of end-stage renal disease on hemodialysis TTS, history of MSSA bacteremia, systolic and diastolic heart failure, SLE with history of pericarditis, multiple hospitalizations for noncompliance with hemodialysis treatments, etc.  Patient presented to hemodialysis yesterday after having missed 2 prior sessions and they required that she have labs done prior to treatment.  She ended up coming to the ED today and noted to have hypertensive urgency, volume overload and severe hyperkalemia with EKG changes.  Nephrology was consulted for emergency hemodialysis.  The patient was given bicarbonate, Lokelma as a temporizing measure until she can have hemodialysis treatment.  She is going to be admitted for overnight observation.  She has complaints of diarrhea and generalized abdominal pain and abdominal bloating secondary to volume overload which is typical to how she feels when missing hemodialysis treatments.  She denies fever and chills at this time.  She denies chest pain symptoms.

## 2022-06-06 NOTE — ED Notes (Signed)
Date and time results received: 06/06/22 1115 (use smartphrase ".now" to insert current time)  Test: potassium Critical Value: 7  Name of Provider Notified: Dr. Roderic Palau  Orders Received? Or Actions Taken?: no/na

## 2022-06-06 NOTE — Assessment & Plan Note (Signed)
-   Secondary to volume overload which will be treated with hemodialysis.  We have resumed her home blood pressure medications and IV hydralazine ordered as needed for elevated blood pressure readings but anticipate blood pressure will improve with hemodialysis treatment  -- BP has improved after 2 HD treatments

## 2022-06-06 NOTE — Procedures (Signed)
   HEMODIALYSIS TREATMENT NOTE:   3.5 hour heparin-free treatment completed using right IJ TDC.  Cath entry site is unremarkable. Goal met: 3.7 liters removed without interruption in UF.  Dialyzed first hour on lowest potassium bath. All blood was returned.  NSR 60s without ectopy after HD; otherwise, no changes from pre-HD assessment.  Report given to Lowry Ram, RN   Rockwell Alexandria, RN

## 2022-06-06 NOTE — Assessment & Plan Note (Signed)
>>  ASSESSMENT AND PLAN FOR TOBACCO DEPENDENCE WRITTEN ON 06/06/2022 12:16 PM BY Cleora Fleet, MD  - We will offer nicotine patch while in hospital

## 2022-06-06 NOTE — Care Management Obs Status (Signed)
[facility] NOTIFICATION   Patient Details  Name: Cathy [name] MRN: [id number] Date of Birth: Jul 27, 1981   Medicare Observation Status Notification Given:  Yes    Iona Beard, [place] 06/06/2022, 12:20 PM

## 2022-06-06 NOTE — Assessment & Plan Note (Signed)
-   Nephrology arranging for emergency hemodialysis treatment today for volume removal and correction of electrolytes

## 2022-06-06 NOTE — ED Provider Notes (Signed)
[facility] Provider Note   CSN: [id number] Arrival date & time: 06/06/22  1016     History  Chief Complaint  Patient presents with   Abdominal Pain    Cathy [name] is a 41 y.o. female.  Patient has a history of renal disease.  She has missed 3 days of dialysis and now complains of abdominal distention diarrhea and abdominal pain  The history is provided by the patient. No language interpreter was used.  Abdominal Pain Pain location:  Generalized Pain quality: aching   Pain radiates to:  Does not radiate Pain severity:  Moderate Onset quality:  Sudden Timing:  Constant Progression:  Worsening Chronicity:  Recurrent Context: not alcohol use   Associated symptoms: no chest pain, no cough, no diarrhea, no fatigue and no hematuria        Home Medications Prior to Admission medications   Medication Sig Start Date End Date Taking? Authorizing Provider  acetaminophen (TYLENOL) 500 MG tablet Take 1,000 mg by mouth every 6 (six) hours as needed for moderate pain.    [provider]  albuterol (VENTOLIN HFA) 108 (90 Base) MCG/ACT inhaler Inhale 2 puffs into the lungs every 6 (six) hours as needed for wheezing or shortness of breath. 12/17/21 12/17/22  Rai, Ripudeep K, MD  amLODipine (NORVASC) 10 MG tablet Take 1 tablet (10 mg total) by mouth daily. 03/05/22   Orson Eva, MD  apixaban (ELIQUIS) 5 MG TABS tablet Take 1 tablet (5 mg total) by mouth 2 (two) times daily. 03/05/22   Orson Eva, MD  Blood Pressure Monitoring (OMRON 3 SERIES BP MONITOR) DEVI Use as directed 2 times a day 01/09/22   Shelly Coss, MD  calcium acetate (PHOSLO) 667 MG capsule Take 2 capsules (1,334 mg total) by mouth 3 (three) times daily with meals. 03/05/22   Orson Eva, MD  carvedilol (COREG) 25 MG tablet Take 1 tablet (25 mg total) by mouth 2 (two) times daily with a meal. 03/05/22   Tat, Shanon Brow, MD  cloNIDine (CATAPRES) 0.2 MG tablet Take 1 tablet (0.2 mg total) by mouth every 8  (eight) hours. 03/05/22   Orson Eva, MD  Doxercalciferol (HECTOROL IV) Doxercalciferol (Hectorol) 03/06/22 03/05/23  [provider]  guaiFENesin (MUCINEX) 600 MG 12 hr tablet Take 600 mg by mouth 2 (two) times daily as needed for cough.    [provider]  hydrALAZINE (APRESOLINE) 100 MG tablet Take 1 tablet (100 mg total) by mouth 3 (three) times daily. 03/05/22   Orson Eva, MD  ipratropium (ATROVENT HFA) 17 MCG/ACT inhaler Inhale 2 puffs into the lungs every 6 (six) hours as needed for wheezing. 12/17/21 12/17/22  Rai, Vernelle Emerald, MD  losartan (COZAAR) 100 MG tablet Take 1 tablet (100 mg total) by mouth at bedtime. 03/05/22   Orson Eva, MD  methocarbamol (ROBAXIN) 500 MG tablet Take 1 tablet (500 mg total) by mouth every 8 (eight) hours as needed for muscle spasms. 03/21/22   Dessa Phi, DO  ondansetron (ZOFRAN-ODT) 4 MG disintegrating tablet 4mg  ODT q4 hours prn nausea/vomit 06/05/22   Milton Ferguson, MD  sevelamer carbonate (RENVELA) 800 MG tablet Take 1 tablet (800 mg total) by mouth 3 (three) times daily. Patient not taking: Reported on 03/17/2022 03/05/22   Orson Eva, MD  fenofibrate 160 MG tablet Take 1 tablet (160 mg total) by mouth daily. 12/31/20 01/20/21  Regalado, Cassie Freer, MD      Allergies    Oxycodone    Review of  Systems   Review of Systems  Constitutional:  Negative for appetite change and fatigue.  HENT:  Negative for congestion, ear discharge and sinus pressure.   Eyes:  Negative for discharge.  Respiratory:  Negative for cough.   Cardiovascular:  Negative for chest pain.  Gastrointestinal:  Positive for abdominal pain. Negative for diarrhea.       Diarrhea  Genitourinary:  Negative for frequency and hematuria.  Musculoskeletal:  Negative for back pain.  Skin:  Negative for rash.  Neurological:  Negative for seizures and headaches.  Psychiatric/Behavioral:  Negative for hallucinations.     Physical Exam Updated Vital Signs BP (!) 180/112 (BP Location:  Right Arm)   Pulse 64   Temp 97.9 F (36.6 C) (Oral)   Resp 18   Ht 5\' 7"  (1.702 m)   Wt 58.7 kg   LMP 06/05/2020   SpO2 100%   BMI 20.27 kg/m  Physical Exam Vitals and nursing note reviewed.  Constitutional:      Appearance: She is well-developed.  HENT:     Head: Normocephalic.     Right Ear: Tympanic membrane normal.     Mouth/Throat:     Mouth: Mucous membranes are moist.  Eyes:     General: No scleral icterus.    Conjunctiva/sclera: Conjunctivae normal.  Neck:     Thyroid: No thyromegaly.  Cardiovascular:     Rate and Rhythm: Normal rate and regular rhythm.     Heart sounds: No murmur heard.    No friction rub. No gallop.  Pulmonary:     Breath sounds: No stridor. No wheezing or rales.  Chest:     Chest wall: No tenderness.  Abdominal:     General: There is no distension.     Tenderness: There is abdominal tenderness. There is no rebound.  Musculoskeletal:        General: Normal range of motion.     Cervical back: Neck supple.  Lymphadenopathy:     Cervical: No cervical adenopathy.  Skin:    Findings: No erythema or rash.  Neurological:     Mental Status: She is alert and oriented to person, place, and time.     Motor: No abnormal muscle tone.     Coordination: Coordination normal.  Psychiatric:        Behavior: Behavior normal.     ED Results / Procedures / Treatments   Labs (all labs ordered are listed, but only abnormal results are displayed) Labs Reviewed  BASIC METABOLIC PANEL - Abnormal; Notable for the following components:      Result Value   Potassium 7.0 (*)    CO2 21 (*)    BUN 108 (*)    Creatinine, Ser 18.80 (*)    Calcium 8.4 (*)    GFR, Estimated 2 (*)    All other components within normal limits  CBC WITH DIFFERENTIAL/PLATELET - Abnormal; Notable for the following components:   Hemoglobin 11.3 (*)    RDW 17.9 (*)    All other components within normal limits  HEPATITIS B SURFACE ANTIBODY,QUALITATIVE - Abnormal; Notable for the  following components:   Hep B S Ab Reactive (*)    All other components within normal limits  HEPATITIS B SURFACE ANTIGEN  HEPATITIS B SURFACE ANTIGEN  HEPATITIS B CORE ANTIBODY, TOTAL  HEPATITIS C ANTIBODY  HEPATITIS B SURFACE ANTIBODY, QUANTITATIVE  HEPATITIS B SURFACE ANTIBODY, QUANTITATIVE  RENAL FUNCTION PANEL  MAGNESIUM    EKG None  Radiology No results found.  Procedures  Procedures    Medications Ordered in ED Medications  sodium bicarbonate injection 50 mEq (has no administration in time range)  Chlorhexidine Gluconate Cloth 2 % PADS 6 each (has no administration in time range)  pentafluoroprop-tetrafluoroeth (GEBAUERS) aerosol 1 Application (has no administration in time range)  lidocaine (PF) (XYLOCAINE) 1 % injection 5 mL (has no administration in time range)  lidocaine-prilocaine (EMLA) cream 1 Application (has no administration in time range)  heparin injection 1,000 Units (3,200 Units Intracatheter Given 06/06/22 1640)  anticoagulant sodium citrate solution 5 mL (has no administration in time range)  alteplase (CATHFLO ACTIVASE) injection 2 mg (has no administration in time range)  acetaminophen (TYLENOL) tablet 1,000 mg (has no administration in time range)  amLODipine (NORVASC) tablet 10 mg (has no administration in time range)  carvedilol (COREG) tablet 25 mg (has no administration in time range)  cloNIDine (CATAPRES) tablet 0.2 mg (has no administration in time range)  hydrALAZINE (APRESOLINE) tablet 100 mg (has no administration in time range)  losartan (COZAAR) tablet 100 mg (has no administration in time range)  calcium acetate (PHOSLO) capsule 1,334 mg (has no administration in time range)  apixaban (ELIQUIS) tablet 5 mg (has no administration in time range)  albuterol (PROVENTIL) (2.5 MG/3ML) 0.083% nebulizer solution 3 mL (has no administration in time range)  guaiFENesin (MUCINEX) 12 hr tablet 600 mg (has no administration in time range)  ipratropium  (ATROVENT) nebulizer solution 0.5 mg (has no administration in time range)  acetaminophen (TYLENOL) tablet 650 mg (has no administration in time range)    Or  acetaminophen (TYLENOL) suppository 650 mg (has no administration in time range)  ondansetron (ZOFRAN) tablet 4 mg (has no administration in time range)    Or  ondansetron (ZOFRAN) injection 4 mg (has no administration in time range)  hydrALAZINE (APRESOLINE) injection 10 mg (has no administration in time range)  ferric gluconate (FERRLECIT) 125 mg in sodium chloride 0.9 % 100 mL IVPB (125 mg Intravenous New Bag/Given 06/06/22 1530)  doxercalciferol (HECTOROL) injection 2 mcg (2 mcg Intravenous Given 06/06/22 1500)  diphenhydrAMINE (BENADRYL) injection 12.5 mg (12.5 mg Intravenous Given 06/06/22 1310)  sodium zirconium cyclosilicate (LOKELMA) packet 10 g (10 g Oral Given 06/06/22 1154)  amLODipine (NORVASC) tablet 10 mg (10 mg Oral Given 06/06/22 1154)  carvedilol (COREG) tablet 25 mg (25 mg Oral Given 06/06/22 1155)  cloNIDine (CATAPRES) tablet 0.2 mg (0.2 mg Oral Given 06/06/22 1155)  hydrALAZINE (APRESOLINE) tablet 100 mg (100 mg Oral Given 06/06/22 1150)  ondansetron (ZOFRAN-ODT) disintegrating tablet 4 mg (4 mg Oral Given 06/06/22 1150)  HYDROmorphone (DILAUDID) injection 0.5 mg (0.5 mg Intravenous Given 06/06/22 1143)    ED Course/ Medical Decision Making/ A&P  CRITICAL CARE Performed by: Milton Ferguson Total critical care time: 40 minutes Critical care time was exclusive of separately billable procedures and treating other patients. Critical care was necessary to treat or prevent imminent or life-threatening deterioration. Critical care was time spent personally by me on the following activities: development of treatment plan with patient and/or surrogate as well as nursing, discussions with consultants, evaluation of patient's response to treatment, examination of patient, obtaining history from patient or surrogate, ordering and performing  treatments and interventions, ordering and review of laboratory studies, ordering and review of radiographic studies, pulse oximetry and re-evaluation of patient's condition.      Patient has a potassium of 7.  She also has a lot of fluid in her abdomen is.  I called nephrology and they agree with dialyzing  her soon as possible.  She was given some Lokelma and bicarb                         Medical Decision Making Amount and/or Complexity of Data Reviewed Labs: ordered. ECG/medicine tests: ordered.  Risk Prescription drug management. Decision regarding hospitalization.  This patient presents to the ED for concern of abdominal pain, this involves an extensive number of treatment options, and is a complaint that carries with it a high risk of complications and morbidity.  The differential diagnosis includes fluid from dialysis, infection   Co morbidities that complicate the patient evaluation  Renal failure   Additional history obtained:  Additional history obtained from patient External records from outside source obtained and reviewed including hospital records   Lab Tests:  I Ordered, and personally interpreted labs.  The pertinent results include: Potassium of 7.0, along with creatinine of 18.8 BUN 108   Imaging Studies ordered:  No imaging  Cardiac Monitoring: / EKG:  The patient was maintained on a cardiac monitor.  I personally viewed and interpreted the cardiac monitored which showed an underlying rhythm of: Normal sinus rhythm   Consultations Obtained:  I requested consultation with the hospitalist and nephrology,  and discussed lab and imaging findings as well as pertinent plan - they recommend: Admit and give dialysis   Problem List / ED Course / Critical interventions / Medication management  Abdominal pain and renal failure I ordered medication including Lokelma, bicarb Reevaluation of the patient after these medicines showed that the patient improved I  have reviewed the patients home medicines and have made adjustments as needed   Social Determinants of Health:  Dialysis   Test / Admission - Considered:  No other test necessary  Renal failure with hyperkalemia.  Patient will get dialysis to be admitted        Final Clinical Impression(s) / ED Diagnoses Final diagnoses:  None    Rx / DC Orders ED Discharge Orders     None         Milton Ferguson, MD 06/06/22 1733

## 2022-06-06 NOTE — Assessment & Plan Note (Signed)
-   Stable outpatient follow-up with nephrology

## 2022-06-06 NOTE — Assessment & Plan Note (Signed)
-   Volume removal with hemodialysis as noted

## 2022-06-06 NOTE — Assessment & Plan Note (Signed)
-   Volume removal with emergency two hemodialysis treatments

## 2022-06-06 NOTE — ED Triage Notes (Signed)
Pt to ER with c/o abdominal pain and nausea.  Pt reports that she has missed 3 dialysis treatments.  Pt is T,TH,S pt.  States she missed Saturday due to transportation, missed Tuesday due to family outing and was not allowed to come to center yesterday as they required her to be evaluated and have blood work.  Pt presents today for that evaluation.

## 2022-06-06 NOTE — Assessment & Plan Note (Signed)
-   We will offer nicotine patch while in hospital

## 2022-06-07 DIAGNOSIS — N186 End stage renal disease: Secondary | ICD-10-CM

## 2022-06-07 DIAGNOSIS — Z992 Dependence on renal dialysis: Secondary | ICD-10-CM | POA: Diagnosis not present

## 2022-06-07 DIAGNOSIS — I5020 Unspecified systolic (congestive) heart failure: Secondary | ICD-10-CM | POA: Diagnosis not present

## 2022-06-07 DIAGNOSIS — I10 Essential (primary) hypertension: Secondary | ICD-10-CM | POA: Diagnosis not present

## 2022-06-07 DIAGNOSIS — F17200 Nicotine dependence, unspecified, uncomplicated: Secondary | ICD-10-CM | POA: Diagnosis not present

## 2022-06-07 DIAGNOSIS — I160 Hypertensive urgency: Secondary | ICD-10-CM | POA: Diagnosis not present

## 2022-06-07 DIAGNOSIS — E8770 Fluid overload, unspecified: Secondary | ICD-10-CM | POA: Diagnosis not present

## 2022-06-07 DIAGNOSIS — M3214 Glomerular disease in systemic lupus erythematosus: Secondary | ICD-10-CM | POA: Diagnosis not present

## 2022-06-07 DIAGNOSIS — E875 Hyperkalemia: Secondary | ICD-10-CM | POA: Diagnosis not present

## 2022-06-07 LAB — RENAL FUNCTION PANEL
Albumin: 2.9 g/dL — ABNORMAL LOW (ref 3.5–5.0)
Anion gap: 10 (ref 5–15)
BUN: 50 mg/dL — ABNORMAL HIGH (ref 6–20)
CO2: 28 mmol/L (ref 22–32)
Calcium: 7.9 mg/dL — ABNORMAL LOW (ref 8.9–10.3)
Chloride: 102 mmol/L (ref 98–111)
Creatinine, Ser: 10.45 mg/dL — ABNORMAL HIGH (ref 0.44–1.00)
GFR, Estimated: 4 mL/min — ABNORMAL LOW (ref >60)
Glucose, Bld: 82 mg/dL (ref 70–99)
Phosphorus: 6.1 mg/dL — ABNORMAL HIGH (ref 2.5–4.6)
Potassium: 5.2 mmol/L — ABNORMAL HIGH (ref 3.5–5.1)
Sodium: 140 mmol/L (ref 135–145)

## 2022-06-07 LAB — HEPATITIS B SURFACE ANTIBODY, QUANTITATIVE
Hep B S AB Quant (Post): 103.1 m[IU]/mL (ref >9.9)
Hep B S AB Quant (Post): 114.3 m[IU]/mL (ref >9.9)

## 2022-06-07 LAB — MAGNESIUM: Magnesium: 2 mg/dL (ref 1.7–2.4)

## 2022-06-07 MED ORDER — DIPHENHYDRAMINE HCL 50 MG/ML IJ SOLN
INTRAMUSCULAR | Status: AC
  Administered 2022-06-07: 12.5 mg via INTRAVENOUS
  Filled 2022-06-07: qty 1

## 2022-06-07 MED ORDER — HEPARIN SODIUM (PORCINE) 1000 UNIT/ML IJ SOLN
INTRAMUSCULAR | Status: AC
  Administered 2022-06-07: 3200 [IU]
  Filled 2022-06-07: qty 6

## 2022-06-07 MED ORDER — SODIUM ZIRCONIUM CYCLOSILICATE 10 G PO PACK
10.0000 g | PACK | Freq: Three times a day (TID) | ORAL | Status: AC
Start: 2022-06-07 — End: 2022-06-07
  Administered 2022-06-07 (×2): 10 g via ORAL
  Filled 2022-06-07 (×2): qty 1

## 2022-06-07 NOTE — Progress Notes (Signed)
Labs reviewed.  Pt is still over 3 kg above EDW and K elevated.  Will plan for HD again today and hopefully will be stable for discharge to home.  She apparently is schedule for access on Tuesday and will need to arrange makeup treatment with her home unit on Monday.  Discussed with [facility] and will arrange for Monday treatment.

## 2022-06-07 NOTE — Discharge Instructions (Signed)
Please go to dialysis on Monday for special treatment due to your appointment for fistula on Tuesday   IMPORTANT INFORMATION: [place]  Follow with Primary care provider  Gildardo Pounds, NP  and other consultants as instructed by your Hospitalist Physician  [place], WORSEN OR NEW PROBLEM DEVELOPS   Please note: You were cared for by a hospitalist during your hospital stay. Every effort will be made to forward records to your primary care provider.  You can request that your primary care provider send for your hospital records if they have not received them.  Once you are discharged, your primary care physician will handle any further medical issues. Please note that NO REFILLS for any discharge medications will be authorized once you are discharged, as it is imperative that you return to your primary care physician (or establish a relationship with a primary care physician if you do not have one) for your post hospital discharge needs so that they can reassess your need for medications and monitor your lab values.  Please get a complete blood count and chemistry panel checked by your Primary MD at your next visit, and again as instructed by your Primary MD.  Get Medicines reviewed and adjusted: Please take all your medications with you for your next visit with your Primary MD  Laboratory/radiological data: Please request your Primary MD to go over all hospital tests and procedure/radiological results at the follow up, please ask your primary care provider to get all Hospital records sent to his/her office.  In some cases, they will be blood work, cultures and biopsy results pending at the time of your discharge. Please request that your primary care provider follow up on these results.  If you are diabetic, please bring your blood sugar readings with you to your follow up appointment  with primary care.    Please call and make your follow up appointments as soon as possible.    Also Note the following: If you experience worsening of your admission symptoms, develop shortness of breath, life threatening emergency, suicidal or homicidal thoughts you must seek medical attention immediately by calling 911 or calling your MD immediately  if symptoms less severe.  You must read complete instructions/literature along with all the possible adverse reactions/side effects for all the Medicines you take and that have been prescribed to you. Take any new Medicines after you have completely understood and accpet all the possible adverse reactions/side effects.   Do not drive when taking Pain medications or sleeping medications (Benzodiazepines)  Do not take more than prescribed Pain, Sleep and Anxiety Medications. It is not advisable to combine anxiety,sleep and pain medications without talking with your primary care practitioner  Special Instructions: If you have smoked or chewed Tobacco  in the last 2 yrs please stop smoking, stop any regular Alcohol  and or any Recreational drug use.  Wear Seat belts while driving.  Do not drive if taking any narcotic, mind altering or controlled substances or recreational drugs or alcohol.

## 2022-06-07 NOTE — Plan of Care (Signed)

## 2022-06-07 NOTE — Discharge Summary (Signed)
Physician Discharge Summary  Cathy [name]:[id number] DOB: 1981/02/16 DOA: 06/06/2022  PCP: Gildardo Pounds, NP  Admit date: 06/06/2022 Discharge date: 06/07/2022  Admitted From:  Home  Disposition: Home   Recommendations for Outpatient Follow-up:  Go to HD on 7/10 for special circumstance treatment as arranged for you  See Dr. Donnetta Hutching on 7/11 as scheduled for procedure   Discharge Condition: STABLE   CODE STATUS: FULL DIET: renal / fluid restriction    Brief Hospitalization Summary: Please see all hospital notes, images, labs for full details of the hospitalization. 41 year old female with history of end-stage renal disease on hemodialysis TTS, history of MSSA bacteremia, systolic and diastolic heart failure, SLE with history of pericarditis, multiple hospitalizations for noncompliance with hemodialysis treatments, etc.  Patient presented to hemodialysis yesterday after having missed 2 prior sessions and they required that she have labs done prior to treatment.  She ended up coming to the ED today and noted to have hypertensive urgency, volume overload and severe hyperkalemia with EKG changes.  Nephrology was consulted for emergency hemodialysis.  The patient was given bicarbonate, Lokelma as a temporizing measure until she can have hemodialysis treatment.  She is going to be admitted for overnight observation.  She has complaints of diarrhea and generalized abdominal pain and abdominal bloating secondary to volume overload which is typical to how she feels when missing hemodialysis treatments.  She denies fever and chills at this time.  She denies chest pain symptoms.  Hospital Course by problem   Assessment and Plan: * Acute hyperkalemia - Treated with 2 HD treatments on 7/7 and 7/8  Volume overload - Volume removal with emergency two hemodialysis treatments  Hypertensive urgency - Secondary to volume overload which will be treated with hemodialysis.  We have resumed her home blood  pressure medications and IV hydralazine ordered as needed for elevated blood pressure readings but anticipate blood pressure will improve with hemodialysis treatment  -- BP has improved after 2 HD treatments   Malignant hypertension - Long history of poor compliance with taking blood pressure medications. - BP elevation is secondary to volume overload from missing 3 dialysis treatments -Blood pressure markedly improved with volume removal during hemodialysis Pt had a treatment 7/7 and 7/8 and feeling much better.   ESRD on hemodialysis [facility]) - Nephrology arranging for emergency hemodialysis treatment for volume removal and correction of electrolytes - - pt had HD on 7/7 and 7/8 and she is feeling much better now, BP is better and asking to go home.   -- Dr. Marval Regal made arrangements for patient to have HD on Monday 7/10 due to special circumstances -- she is due to have fistula procedure on Tue 7/11 with Dr. Donnetta Hutching   Tobacco dependence - We will offer nicotine patch while in hospital  HFrEF (heart failure with reduced ejection fraction) ([place]) - Volume removal with hemodialysis as noted  Lupus nephritis ([place]) - Stable outpatient follow-up with nephrology  Asthma - Stable no acute symptoms resume home bronchodilators   Discharge Diagnoses:  Principal Problem:   Acute hyperkalemia Active Problems:   ESRD on hemodialysis ([place])   Malignant hypertension   Hypertensive urgency   Volume overload   Asthma   Lupus nephritis ([place])   HFrEF (heart failure with reduced ejection fraction) ([place])   Tobacco dependence   Discharge Instructions:  Allergies as of 06/07/2022       Reactions   Oxycodone Itching        Medication List  STOP taking these medications    sevelamer carbonate 800 MG tablet Commonly known as: RENVELA       TAKE these medications    acetaminophen 500 MG tablet Commonly known as: TYLENOL Take 1,000 mg by mouth every 6 (six) hours as needed for  moderate pain.   albuterol 108 (90 Base) MCG/ACT inhaler Commonly known as: VENTOLIN HFA Inhale 2 puffs into the lungs every 6 (six) hours as needed for wheezing or shortness of breath.   amLODipine 10 MG tablet Commonly known as: NORVASC Take 1 tablet (10 mg total) by mouth daily.   apixaban 5 MG Tabs tablet Commonly known as: ELIQUIS Take 1 tablet (5 mg total) by mouth 2 (two) times daily.   calcium acetate 667 MG capsule Commonly known as: PHOSLO Take 2 capsules (1,334 mg total) by mouth 3 (three) times daily with meals.   carvedilol 25 MG tablet Commonly known as: COREG Take 1 tablet (25 mg total) by mouth 2 (two) times daily with a meal.   cloNIDine 0.2 MG tablet Commonly known as: CATAPRES Take 1 tablet (0.2 mg total) by mouth every 8 (eight) hours.   guaiFENesin 600 MG 12 hr tablet Commonly known as: MUCINEX Take 600 mg by mouth 2 (two) times daily as needed for cough.   HECTOROL IV Doxercalciferol (Hectorol)   hydrALAZINE 100 MG tablet Commonly known as: APRESOLINE Take 1 tablet (100 mg total) by mouth 3 (three) times daily.   ipratropium 17 MCG/ACT inhaler Commonly known as: ATROVENT HFA Inhale 2 puffs into the lungs every 6 (six) hours as needed for wheezing.   losartan 100 MG tablet Commonly known as: COZAAR Take 1 tablet (100 mg total) by mouth at bedtime.   Omron 3 Series BP Monitor Devi Use as directed 2 times a day        Follow-up Information     hemodialysis. Go on 06/09/2022.   Why: Dr. Marval Regal arranged for a special Monday dialysis treatment at [facility] due to having fistula on Tuesday               Allergies  Allergen Reactions   Oxycodone Itching   Allergies as of 06/07/2022       Reactions   Oxycodone Itching        Medication List     STOP taking these medications    sevelamer carbonate 800 MG tablet Commonly known as: RENVELA       TAKE these medications    acetaminophen 500 MG tablet Commonly known as:  TYLENOL Take 1,000 mg by mouth every 6 (six) hours as needed for moderate pain.   albuterol 108 (90 Base) MCG/ACT inhaler Commonly known as: VENTOLIN HFA Inhale 2 puffs into the lungs every 6 (six) hours as needed for wheezing or shortness of breath.   amLODipine 10 MG tablet Commonly known as: NORVASC Take 1 tablet (10 mg total) by mouth daily.   apixaban 5 MG Tabs tablet Commonly known as: ELIQUIS Take 1 tablet (5 mg total) by mouth 2 (two) times daily.   calcium acetate 667 MG capsule Commonly known as: PHOSLO Take 2 capsules (1,334 mg total) by mouth 3 (three) times daily with meals.   carvedilol 25 MG tablet Commonly known as: COREG Take 1 tablet (25 mg total) by mouth 2 (two) times daily with a meal.   cloNIDine 0.2 MG tablet Commonly known as: CATAPRES Take 1 tablet (0.2 mg total) by mouth every 8 (eight) hours.   guaiFENesin 600 MG 12  hr tablet Commonly known as: MUCINEX Take 600 mg by mouth 2 (two) times daily as needed for cough.   HECTOROL IV Doxercalciferol (Hectorol)   hydrALAZINE 100 MG tablet Commonly known as: APRESOLINE Take 1 tablet (100 mg total) by mouth 3 (three) times daily.   ipratropium 17 MCG/ACT inhaler Commonly known as: ATROVENT HFA Inhale 2 puffs into the lungs every 6 (six) hours as needed for wheezing.   losartan 100 MG tablet Commonly known as: COZAAR Take 1 tablet (100 mg total) by mouth at bedtime.   Omron 3 Series BP Monitor Devi Use as directed 2 times a day        Procedures/Studies: No results found.   Subjective: Pt asking to go home, feels much better now after 2 HD treatments   Discharge Exam: Vitals:   06/07/22 1418 06/07/22 1430  BP: (!) 143/99 (!) 150/101  Pulse: 65 64  Resp: 14 16  Temp:  98.2 F (36.8 C)  SpO2:  100%   Vitals:   06/07/22 1330 06/07/22 1400 06/07/22 1418 06/07/22 1430  BP: (!) 145/105 (!) 144/103 (!) 143/99 (!) 150/101  Pulse: 63 61 65 64  Resp: 16 16 14 16   Temp:    98.2 F (36.8  C)  TempSrc:    Oral  SpO2:    100%  Weight:    56 kg  Height:        General: Pt is alert, awake, not in acute distress Cardiovascular: RRR, S1/S2 +, no rubs, no gallops Respiratory: CTA bilaterally, no wheezing, no rhonchi Abdominal: Soft, NT, ND, bowel sounds + Extremities: no edema, no cyanosis   The results of significant diagnostics from this hospitalization (including imaging, microbiology, ancillary and laboratory) are listed below for reference.     Microbiology: No results found for this or any previous visit (from the past 240 hour(s)).   Labs: BNP (last 3 results) Recent Labs    12/13/21 1005 12/26/21 2049 03/01/22 1040  BNP >4,500.0* >4,500.0* >7,408.1*   Basic Metabolic Panel: Recent Labs  Lab 06/06/22 1040 06/07/22 0455 06/07/22 0456  NA 139 140  --   K 7.0* 5.2*  --   CL 104 102  --   CO2 21* 28  --   GLUCOSE 97 82  --   BUN 108* 50*  --   CREATININE 18.80* 10.45*  --   CALCIUM 8.4* 7.9*  --   MG  --   --  2.0  PHOS  --  6.1*  --    Liver Function Tests: Recent Labs  Lab 06/07/22 0455  ALBUMIN 2.9*   No results for input(s): "LIPASE", "AMYLASE" in the last 168 hours. No results for input(s): "AMMONIA" in the last 168 hours. CBC: Recent Labs  Lab 06/06/22 1040  WBC 5.0  NEUTROABS 3.1  HGB 11.3*  HCT 36.1  MCV 90.0  PLT 284   Cardiac Enzymes: No results for input(s): "CKTOTAL", "CKMB", "CKMBINDEX", "TROPONINI" in the last 168 hours. BNP: Invalid input(s): "POCBNP" CBG: No results for input(s): "GLUCAP" in the last 168 hours. D-Dimer No results for input(s): "DDIMER" in the last 72 hours. Hgb A1c No results for input(s): "HGBA1C" in the last 72 hours. Lipid Profile No results for input(s): "CHOL", "HDL", "LDLCALC", "TRIG", "CHOLHDL", "LDLDIRECT" in the last 72 hours. Thyroid function studies No results for input(s): "TSH", "T4TOTAL", "T3FREE", "THYROIDAB" in the last 72 hours.  Invalid input(s): "FREET3" Anemia work up No  results for input(s): "VITAMINB12", "FOLATE", "FERRITIN", "TIBC", "IRON", "RETICCTPCT" in  the last 72 hours. Urinalysis    Component Value Date/Time   COLORURINE YELLOW 11/29/2020 2054   APPEARANCEUR HAZY (A) 11/29/2020 2054   LABSPEC 1.015 11/29/2020 2054   PHURINE 6.0 11/29/2020 2054   GLUCOSEU NEGATIVE 11/29/2020 2054   HGBUR MODERATE (A) 11/29/2020 2054   [place] NEGATIVE 11/29/2020 2054   [place] 11/29/2020 2054   PROTEINUR >=300 (A) 11/29/2020 2054   UROBILINOGEN 0.2 10/17/2020 1026   NITRITE NEGATIVE 11/29/2020 2054   LEUKOCYTESUR NEGATIVE 11/29/2020 2054   Sepsis Labs Recent Labs  Lab 06/06/22 1040  WBC 5.0   Microbiology No results found for this or any previous visit (from the past 240 hour(s)).  Time coordinating discharge: 32 mins  SIGNED:  Irwin Brakeman, MD  [facility] 06/07/2022, 3:14 PM How to contact the [facility] Attending or Consulting provider [place] or covering provider during after hours [place], for this patient?  Check the care team in [facility] and look for a) attending/consulting [facility] provider listed and b) the [facility] team listed Log into [email or web address] and use [facility]'s universal password to access. If you do not have the password, please contact the hospital operator. Locate the [facility] provider you are looking for under [facility] and page to a number that you can be directly reached. If you still have difficulty reaching the provider, please page the [facility] (Director on Call) for the Hospitalists listed on amion for assistance.

## 2022-06-07 NOTE — Procedures (Signed)
   HEMODIALYSIS TREATMENT NOTE:   Uneventful 3 hour heparin-free treatment completed using RIJ TDC. Goal met: 3 liters removed without interruption in UF.  All blood was returned.  No changes from pre-HD assessment.  Report called to primary nurse Caryl Asp.   Rockwell Alexandria, RN

## 2022-06-09 ENCOUNTER — Telehealth: Payer: Self-pay

## 2022-06-09 DIAGNOSIS — Z992 Dependence on renal dialysis: Secondary | ICD-10-CM | POA: Diagnosis not present

## 2022-06-09 DIAGNOSIS — N2581 Secondary hyperparathyroidism of renal origin: Secondary | ICD-10-CM | POA: Diagnosis not present

## 2022-06-09 DIAGNOSIS — N186 End stage renal disease: Secondary | ICD-10-CM | POA: Diagnosis not present

## 2022-06-09 NOTE — Telephone Encounter (Signed)
Transition Care Management Unsuccessful Follow-up Telephone Call  Date of discharge and from where:  06/07/2022, [facility]  Attempts:  1st Attempt  Reason for unsuccessful TCM follow-up call:  Voice mail full  # [phone].

## 2022-06-10 ENCOUNTER — Ambulatory Visit (HOSPITAL_COMMUNITY): Payer: Medicare HMO | Admitting: Certified Registered Nurse Anesthetist

## 2022-06-10 ENCOUNTER — Ambulatory Visit (HOSPITAL_BASED_OUTPATIENT_CLINIC_OR_DEPARTMENT_OTHER): Payer: Medicare HMO | Admitting: Certified Registered Nurse Anesthetist

## 2022-06-10 ENCOUNTER — Encounter (HOSPITAL_COMMUNITY)
Admission: RE | Disposition: A | Discharge status: Home / Self Care | Payer: Self-pay | Source: Home / Self Care | Attending: Vascular Surgery

## 2022-06-10 ENCOUNTER — Other Ambulatory Visit: Payer: Self-pay

## 2022-06-10 ENCOUNTER — Telehealth: Payer: Self-pay

## 2022-06-10 ENCOUNTER — Encounter (HOSPITAL_COMMUNITY): Payer: Self-pay | Admitting: Vascular Surgery

## 2022-06-10 ENCOUNTER — Ambulatory Visit (HOSPITAL_COMMUNITY)
Admission: RE | Admit: 2022-06-10 | Discharge: 2022-06-10 | Disposition: A | Discharge status: Home / Self Care | Payer: Medicare HMO | Attending: Vascular Surgery | Admitting: Vascular Surgery

## 2022-06-10 DIAGNOSIS — N186 End stage renal disease: Secondary | ICD-10-CM

## 2022-06-10 DIAGNOSIS — I5020 Unspecified systolic (congestive) heart failure: Secondary | ICD-10-CM

## 2022-06-10 DIAGNOSIS — Z86711 Personal history of pulmonary embolism: Secondary | ICD-10-CM | POA: Diagnosis not present

## 2022-06-10 DIAGNOSIS — I120 Hypertensive chronic kidney disease with stage 5 chronic kidney disease or end stage renal disease: Secondary | ICD-10-CM | POA: Insufficient documentation

## 2022-06-10 DIAGNOSIS — N185 Chronic kidney disease, stage 5: Secondary | ICD-10-CM

## 2022-06-10 DIAGNOSIS — I132 Hypertensive heart and chronic kidney disease with heart failure and with stage 5 chronic kidney disease, or end stage renal disease: Secondary | ICD-10-CM

## 2022-06-10 DIAGNOSIS — F17210 Nicotine dependence, cigarettes, uncomplicated: Secondary | ICD-10-CM

## 2022-06-10 DIAGNOSIS — Z992 Dependence on renal dialysis: Secondary | ICD-10-CM | POA: Diagnosis not present

## 2022-06-10 DIAGNOSIS — Z7901 Long term (current) use of anticoagulants: Secondary | ICD-10-CM | POA: Insufficient documentation

## 2022-06-10 DIAGNOSIS — D631 Anemia in chronic kidney disease: Secondary | ICD-10-CM | POA: Diagnosis not present

## 2022-06-10 DIAGNOSIS — Z01818 Encounter for other preprocedural examination: Secondary | ICD-10-CM

## 2022-06-10 DIAGNOSIS — I509 Heart failure, unspecified: Secondary | ICD-10-CM | POA: Diagnosis not present

## 2022-06-10 HISTORY — PX: AV FISTULA PLACEMENT: SHX1204

## 2022-06-10 LAB — POCT I-STAT, CHEM 8
BUN: 37 mg/dL — ABNORMAL HIGH (ref 6–20)
Calcium, Ion: 1.08 mmol/L — ABNORMAL LOW (ref 1.15–1.40)
Chloride: 99 mmol/L (ref 98–111)
Creatinine, Ser: 7.9 mg/dL — ABNORMAL HIGH (ref 0.44–1.00)
Glucose, Bld: 97 mg/dL (ref 70–99)
HCT: 40 % (ref 36.0–46.0)
Hemoglobin: 13.6 g/dL (ref 12.0–15.0)
Potassium: 5 mmol/L (ref 3.5–5.1)
Sodium: 136 mmol/L (ref 135–145)
TCO2: 26 mmol/L (ref 22–32)

## 2022-06-10 LAB — POCT PREGNANCY, URINE: Preg Test, Ur: NEGATIVE

## 2022-06-10 SURGERY — ARTERIOVENOUS (AV) FISTULA CREATION
Anesthesia: General | Site: Arm Lower | Laterality: Left

## 2022-06-10 MED ORDER — ONDANSETRON HCL 4 MG/2ML IJ SOLN
INTRAMUSCULAR | Status: AC
  Filled 2022-06-10: qty 2

## 2022-06-10 MED ORDER — LIDOCAINE-EPINEPHRINE 0.5 %-1:200000 IJ SOLN
INTRAMUSCULAR | Status: AC
  Filled 2022-06-10: qty 1

## 2022-06-10 MED ORDER — CHLORHEXIDINE GLUCONATE 4 % EX LIQD: 60.0000 mL | Freq: Once | CUTANEOUS | Status: DC

## 2022-06-10 MED ORDER — BUPIVACAINE HCL (PF) 0.5 % IJ SOLN
INTRAMUSCULAR | Status: AC
  Filled 2022-06-10: qty 30

## 2022-06-10 MED ORDER — FENTANYL CITRATE PF 50 MCG/ML IJ SOSY: 25.0000 ug | PREFILLED_SYRINGE | INTRAMUSCULAR | Status: DC | PRN

## 2022-06-10 MED ORDER — HYDROCODONE-ACETAMINOPHEN 7.5-325 MG PO TABS: 1.0000 | ORAL_TABLET | Freq: Once | ORAL | Status: DC | PRN

## 2022-06-10 MED ORDER — FENTANYL CITRATE (PF) 100 MCG/2ML IJ SOLN
INTRAMUSCULAR | Status: AC
  Filled 2022-06-10: qty 2

## 2022-06-10 MED ORDER — HEPARIN 6000 UNIT IRRIGATION SOLUTION
Status: DC | PRN
  Administered 2022-06-10: 1

## 2022-06-10 MED ORDER — HEPARIN SODIUM (PORCINE) 1000 UNIT/ML IJ SOLN
INTRAMUSCULAR | Status: AC
  Filled 2022-06-10: qty 6

## 2022-06-10 MED ORDER — CHLORHEXIDINE GLUCONATE 0.12 % MT SOLN
OROMUCOSAL | Status: AC
  Administered 2022-06-10: 15 mL
  Filled 2022-06-10: qty 15

## 2022-06-10 MED ORDER — 0.9 % SODIUM CHLORIDE (POUR BTL) OPTIME
TOPICAL | Status: DC | PRN
  Administered 2022-06-10: 500 mL

## 2022-06-10 MED ORDER — SODIUM CHLORIDE 0.9 % IV SOLN: INTRAVENOUS | Status: DC

## 2022-06-10 MED ORDER — LIDOCAINE-EPINEPHRINE 0.5 %-1:200000 IJ SOLN
INTRAMUSCULAR | Status: DC | PRN
  Administered 2022-06-10: 7 mL

## 2022-06-10 MED ORDER — FENTANYL CITRATE (PF) 100 MCG/2ML IJ SOLN
INTRAMUSCULAR | Status: DC | PRN
  Administered 2022-06-10 (×2): 50 ug via INTRAVENOUS

## 2022-06-10 MED ORDER — PROPOFOL 10 MG/ML IV BOLUS
INTRAVENOUS | Status: DC | PRN
  Administered 2022-06-10 (×2): 30 mg via INTRAVENOUS

## 2022-06-10 MED ORDER — LACTATED RINGERS IV SOLN: INTRAVENOUS | Status: DC | PRN

## 2022-06-10 MED ORDER — CEFAZOLIN SODIUM-DEXTROSE 2-4 GM/100ML-% IV SOLN
2.0000 g | INTRAVENOUS | Status: AC
  Administered 2022-06-10: 2 g via INTRAVENOUS

## 2022-06-10 MED ORDER — GLYCOPYRROLATE PF 0.2 MG/ML IJ SOSY
PREFILLED_SYRINGE | INTRAMUSCULAR | Status: AC
  Filled 2022-06-10: qty 1

## 2022-06-10 MED ORDER — ONDANSETRON HCL 4 MG/2ML IJ SOLN: 4.0000 mg | Freq: Once | INTRAMUSCULAR | Status: DC | PRN

## 2022-06-10 MED ORDER — PROPOFOL 500 MG/50ML IV EMUL
INTRAVENOUS | Status: DC | PRN
  Administered 2022-06-10: 150 ug/kg/min via INTRAVENOUS

## 2022-06-10 MED ORDER — LIDOCAINE HCL (PF) 2 % IJ SOLN
INTRAMUSCULAR | Status: AC
  Filled 2022-06-10: qty 5

## 2022-06-10 MED ORDER — PROPOFOL 10 MG/ML IV BOLUS
INTRAVENOUS | Status: AC
  Filled 2022-06-10: qty 20

## 2022-06-10 MED ORDER — MIDAZOLAM HCL 2 MG/2ML IJ SOLN
INTRAMUSCULAR | Status: AC
  Filled 2022-06-10: qty 2

## 2022-06-10 MED ORDER — TRAMADOL HCL 50 MG PO TABS: 50.0000 mg | ORAL_TABLET | Freq: Four times a day (QID) | ORAL | 0 refills | Status: DC | PRN

## 2022-06-10 MED ORDER — ARTIFICIAL TEARS OPHTHALMIC OINT
TOPICAL_OINTMENT | OPHTHALMIC | Status: AC
  Filled 2022-06-10: qty 3.5

## 2022-06-10 MED ORDER — MIDAZOLAM HCL 2 MG/2ML IJ SOLN
INTRAMUSCULAR | Status: DC | PRN
  Administered 2022-06-10: 1 mg via INTRAVENOUS

## 2022-06-10 MED ORDER — CEFAZOLIN SODIUM-DEXTROSE 2-4 GM/100ML-% IV SOLN
INTRAVENOUS | Status: AC
  Filled 2022-06-10: qty 100

## 2022-06-10 MED ORDER — ONDANSETRON HCL 4 MG/2ML IJ SOLN
INTRAMUSCULAR | Status: DC | PRN
  Administered 2022-06-10: 4 mg via INTRAVENOUS

## 2022-06-10 MED ORDER — STERILE WATER FOR IRRIGATION IR SOLN
Status: DC | PRN
  Administered 2022-06-10: 500 mL

## 2022-06-10 SURGICAL SUPPLY — 42 items
ADH SKN CLS APL DERMABOND .7 (GAUZE/BANDAGES/DRESSINGS) ×1
ARMBAND PINK RESTRICT EXTREMIT (MISCELLANEOUS) ×3 IMPLANT
BAG HAMPER (MISCELLANEOUS) ×3 IMPLANT
CANNULA VESSEL 3MM 2 BLNT TIP (CANNULA) ×3 IMPLANT
CLIP LIGATING EXTRA MED SLVR (CLIP) ×3 IMPLANT
CLIP LIGATING EXTRA SM BLUE (MISCELLANEOUS) ×3 IMPLANT
COVER LIGHT HANDLE STERIS (MISCELLANEOUS) ×6 IMPLANT
COVER MAYO STAND XLG (MISCELLANEOUS) ×3 IMPLANT
COVER PROBE U/S 5X48 (MISCELLANEOUS) IMPLANT
DERMABOND ADVANCED (GAUZE/BANDAGES/DRESSINGS) ×1
DERMABOND ADVANCED .7 DNX12 (GAUZE/BANDAGES/DRESSINGS) ×2 IMPLANT
ELECT REM PT RETURN 9FT ADLT (ELECTROSURGICAL) ×2
ELECTRODE REM PT RTRN 9FT ADLT (ELECTROSURGICAL) ×2 IMPLANT
GAUZE SPONGE 4X4 12PLY STRL (GAUZE/BANDAGES/DRESSINGS) ×3 IMPLANT
GLOVE BIO SURGEON STRL SZ7 (GLOVE) ×1 IMPLANT
GLOVE BIOGEL PI IND STRL 7.0 (GLOVE) ×4 IMPLANT
GLOVE BIOGEL PI IND STRL 7.5 (GLOVE) IMPLANT
GLOVE BIOGEL PI INDICATOR 7.0 (GLOVE) ×2
GLOVE BIOGEL PI INDICATOR 7.5 (GLOVE) ×1
GLOVE SURG MICRO LTX SZ7.5 (GLOVE) ×3 IMPLANT
GOWN STRL REUS W/TWL LRG LVL3 (GOWN DISPOSABLE) ×9 IMPLANT
IV NS 500ML (IV SOLUTION) ×2
IV NS 500ML BAXH (IV SOLUTION) ×2 IMPLANT
KIT BLADEGUARD II DBL (SET/KITS/TRAYS/PACK) ×3 IMPLANT
KIT TURNOVER KIT A (KITS) ×3 IMPLANT
MANIFOLD NEPTUNE II (INSTRUMENTS) ×3 IMPLANT
MARKER SKIN DUAL TIP RULER LAB (MISCELLANEOUS) ×6 IMPLANT
NDL HYPO 18GX1.5 BLUNT FILL (NEEDLE) ×2 IMPLANT
NEEDLE HYPO 18GX1.5 BLUNT FILL (NEEDLE) ×2 IMPLANT
NS IRRIG 500ML POUR BTL (IV SOLUTION) ×1 IMPLANT
PACK CV ACCESS (CUSTOM PROCEDURE TRAY) ×3 IMPLANT
PAD ARMBOARD 7.5X6 YLW CONV (MISCELLANEOUS) ×3 IMPLANT
SET BASIN LINEN APH (SET/KITS/TRAYS/PACK) ×3 IMPLANT
SOL PREP POV-IOD 4OZ 10% (MISCELLANEOUS) ×3 IMPLANT
SOL PREP PROV IODINE SCRUB 4OZ (MISCELLANEOUS) ×3 IMPLANT
SPONGE T-LAP 18X18 ~~LOC~~+RFID (SPONGE) ×3 IMPLANT
SUT PROLENE 6 0 CC (SUTURE) ×4 IMPLANT
SUT VIC AB 3-0 SH 27 (SUTURE) ×2
SUT VIC AB 3-0 SH 27X BRD (SUTURE) ×2 IMPLANT
SYR 10ML LL (SYRINGE) ×3 IMPLANT
UNDERPAD 30X36 HEAVY ABSORB (UNDERPADS AND DIAPERS) ×3 IMPLANT
WATER STERILE IRR 500ML POUR (IV SOLUTION) ×1 IMPLANT

## 2022-06-10 NOTE — Anesthesia Postprocedure Evaluation (Signed)
Anesthesia Post Note  Patient: Cathy [name]  Procedure(s) Performed: LEFT ARM ARTERIOVENOUS (AV) FISTULA CREATION (Left: Arm Lower)  Patient location during evaluation: PACU Anesthesia Type: MAC Level of consciousness: awake and alert Pain management: pain level controlled Vital Signs Assessment: post-procedure vital signs reviewed and stable Respiratory status: spontaneous breathing, nonlabored ventilation, respiratory function stable and patient connected to nasal cannula oxygen Cardiovascular status: stable and blood pressure returned to baseline Postop Assessment: no apparent nausea or vomiting Anesthetic complications: no   There were no known notable events for this encounter.   Last Vitals:  Vitals:   06/10/22 0930 06/10/22 0935  BP: 137/77 118/86  Pulse:  71  Resp: 18 16  Temp:  36.6 C  SpO2:  94%    Last Pain:  Vitals:   06/10/22 0935  TempSrc: Oral  PainSc: 0-No pain                 Trixie Rude

## 2022-06-10 NOTE — Transfer of Care (Signed)
Immediate Anesthesia Transfer of Care Note  Patient: Cathy [name]  Procedure(s) Performed: LEFT ARM ARTERIOVENOUS (AV) FISTULA VERSUS ARTERIOVENOUS GRAFT CREATION (Left: Arm Lower)  Patient Location: PACU  Anesthesia Type:MAC  Level of Consciousness: drowsy  Airway & Oxygen Therapy: Patient Spontanous Breathing and Patient connected to nasal cannula oxygen  Post-op Assessment: Report given to RN and Post -op Vital signs reviewed and stable  Post vital signs: Reviewed and stable  Last Vitals:  Vitals Value Taken Time  BP 125/67 06/10/22 0858  Temp    Pulse    Resp 17 06/10/22 0859  SpO2    Vitals shown include unvalidated device data.  Last Pain:  Vitals:   06/10/22 0644  TempSrc: Oral  PainSc: 0-No pain         Complications: No notable events documented.

## 2022-06-10 NOTE — Op Note (Signed)
    OPERATIVE REPORT  DATE OF SURGERY: 06/10/2022  PATIENT: Cathy [name], 41 y.o. female MRN: [id number]  DOB: October 27, 1981  PRE-OPERATIVE DIAGNOSIS: End-stage renal disease  POST-OPERATIVE DIAGNOSIS:  Same  PROCEDURE: Left for stage brachiobasilic fistula  SURGEON:  Curt Jews, M.D.  PHYSICIAN ASSISTANT: Vevelyn Royals, RN, FA  The assistant was needed for exposure and to expedite the case  ANESTHESIA: Local with sedation  EBL: per anesthesia record  Total I/O In: -  Out: 10 [Blood:10]  BLOOD ADMINISTERED: none  DRAINS: none  SPECIMEN: none  COUNTS CORRECT:  YES  PATIENT DISPOSITION:  PACU - hemodynamically stable  PROCEDURE DETAILS: The patient was taken operating placed evaluation with area of the left arm prepped draped you sterile fashion.  SonoSite ultrasound was used to visualize the veins.  Patient did have an adequate basilic vein.  This was on the more medial aspect of the arm at the elbow.  Using local anesthesia incision was made over this vein.  Tributary branches were ligated with 3-0 and 4-0 silk ties and divided.  The vein was ligated distally and divided.  The vein was gently dilated and was of good size for fistula attempt.  Separate incision was made over the brachial artery.  The artery was of large caliber.  A tunnel was created between the basilic vein and the brachial artery.  The artery was occluded proximally and distally and was opened with an 11 blade and sent longstanding with Potts scissors.  The vein was cut to the appropriate length and was spatulated and sewn end-to-side to the artery with a running 6-0 Prolene suture.  Clamps were removed and excellent thrill was noted through the vein.  The patient maintained a left radial pulse.  The wounds were irrigated with saline.  Hemostasis was obtained after cautery.  The wounds were closed with 3-0 Vicryl in the subcutaneous and subcuticular tissue.  Sterile dressing was applied the patient was  transferred to the recovery room in stable condition   [name], M.D., [facility] 06/10/2022 9:26 AM  Note: Portions of this report may have been transcribed using voice recognition software.  Every effort has been made to ensure accuracy; however, inadvertent computerized transcription errors may still be present.

## 2022-06-10 NOTE — Discharge Instructions (Signed)
[facility]  Discharge Instructions  AV Fistula or Graft Surgery for Dialysis Access  Please refer to the following instructions for your post-procedure care. Your surgeon or physician assistant will discuss any changes with you.  Activity  You may drive the day following your surgery, if you are comfortable and no longer taking prescription pain medication. Resume full activity as the soreness in your incision resolves.  Bathing/Showering  You may shower after you go home. Keep your incision dry for 48 hours. Do not soak in a bathtub, hot tub, or swim until the incision heals completely. You may not shower if you have a hemodialysis catheter.  Incision Care  Clean your incision with mild soap and water after 48 hours. Pat the area dry with a clean towel. You do not need a bandage unless otherwise instructed. Do not apply any ointments or creams to your incision. You may have skin glue on your incision. Do not peel it off. It will come off on its own in about one week. Your arm may swell a bit after surgery. To reduce swelling use pillows to elevate your arm so it is above your heart. Your doctor will tell you if you need to lightly wrap your arm with an ACE bandage.  Diet  Resume your normal diet. There are not special food restrictions following this procedure. In order to heal from your surgery, it is CRITICAL to get adequate nutrition. Your body requires vitamins, minerals, and protein. Vegetables are the best source of vitamins and minerals. Vegetables also provide the perfect balance of protein. Processed food has little nutritional value, so try to avoid this.  Medications  Resume taking all of your medications. If your incision is causing pain, you may take over-the counter pain relievers such as acetaminophen (Tylenol). If you were prescribed a stronger pain medication, please be aware these medications can cause nausea and constipation. Prevent  nausea by taking the medication with a snack or meal. Avoid constipation by drinking plenty of fluids and eating foods with high amount of fiber, such as fruits, vegetables, and grains.  Do not take Tylenol if you are taking prescription pain medications.  Follow up Your surgeon may want to see you in the office following your access surgery. If so, this will be arranged at the time of your surgery.  Please call us immediately for any of the following conditions:  Increased pain, redness, drainage (pus) from your incision site Fever of 101 degrees or higher Severe or worsening pain at your incision site Hand pain or numbness.  Reduce your risk of vascular disease:  Stop smoking. If you would like help, call [facility] at 1-800-QUIT-NOW 812-333-3907) or [place] at [place] your cholesterol Maintain a desired weight Control your diabetes Keep your blood pressure down  Dialysis  It will take several weeks to several months for your new dialysis access to be ready for use. Your surgeon will determine when it is okay to use it. Your nephrologist will continue to direct your dialysis. You can continue to use your Permcath until your new access is ready for use.   06/10/2022 Cathy [name] [id number] 07/05/1981  Surgeon(s): Savir Blanke, Arvilla Meres, MD  Procedure(s): LEFT ARM ARTERIOVENOUS (AV) FISTULA VERSUS ARTERIOVENOUS GRAFT CREATION   May stick graft immediately   May stick graft on designated area only:    Do not stick fistula for 12 weeks    If you have any questions, please call the  office at [phone].

## 2022-06-10 NOTE — Telephone Encounter (Signed)
Transition Care Management Follow-up Telephone Call Date of discharge and from where: 06/07/2022 - [facility].   she was at [facility] again today for AVF How have you been since you were released from the hospital? She just left the hospital after her procedure this morning and is now home.  She said she has some pain from the procedure. Her mother is with her.  Any questions or concerns? Yes- she is interested in establishing care in [place] and would like to speak with Lyndee Hensen, RN about her options. I told her that I would ask Trish to contact her again.    Items Reviewed: Did the pt receive and understand the discharge instructions provided? Yes  Medications obtained and verified? Yes - she said she has all of her medications and she did not have any questions about her med regime. Other? No  Any new allergies since your discharge? No  Dietary orders reviewed? No Do you have support at home? Yes   Home Care and Equipment/Supplies: Were home health services ordered? no If so, what is the name of the agency? N/a  Has the agency set up a time to come to the patient's home? not applicable Were any new equipment or medical supplies ordered?  No What is the name of the medical supply agency? N/a Were you able to get the supplies/equipment? not applicable Do you have any questions related to the use of the equipment or supplies? No  Functional Questionnaire: (I = Independent and D = Dependent) ADLs: independent  Attends dialysis: T/T/S in [place].    Follow up appointments reviewed:  PCP Hospital f/u appt confirmed? No  - Geryl Rankins, NP is listed as her PCP but the patient said she is not able to come to [place] and would like to find a PCP in [place] or [place]. [facility] f/u appt confirmed?  She needs to schedule follow up with [facility].   Are transportation arrangements needed?  She is working with the staff at the dialysis center to secure  transportation to her appointments. She has exhausted her [employer] ride benefit and she is trying to arrange transportation with Medicaid and [facility].   If their condition worsens, is the pt aware to call PCP or go to the Emergency Dept.? Yes Was the patient provided with contact information for the PCP's office or ED? Yes Was to pt encouraged to call back with questions or concerns? Yes

## 2022-06-10 NOTE — H&P (Addendum)
Signed                        [facility] [place]   Patient name: Cathy [name]          MRN: [id number]        DOB: 11/03/1981        Sex: female       HPI: Cathy [name] is a 41 y.o. female presented to [facility] today for planned outpatient left arm hemodialysis access.  She had recently been admitted to [facility] in March of this year with catheter sepsis.  She was seen in consultation with my partners in [place].  She had a vein map at that time which showed she may be a candidate for left arm AV fistula.  She lives in the [place] area and therefore was posted for me today as an outpatient.   On presentation to short stay, her diastolic blood pressure was 140 mmHg.  I discussed this with Dr.Keil with anesthesia.  This obviously is a nonemergent case and we did not feel that it was appropriate to proceed with extreme hypertension.  I discussed this with the patient.  I will also contact Dr. Marval Regal regarding her hypertension.  She will be difficult to optimize for surgery with her multiple medical issues.   I did image her left arm veins with SonoSite ultrasound in the holding area.  She has a very sclerotic cephalic vein at the antecubital space.  Her basilic vein is not sclerotic and is moderate size.  I did discuss surgical options with the patient.  She may be a candidate for basilic vein fistula and I explained to her that this would require 2 stages.  The first would be brachiobasilic fistula followed by an office visit in approximately 1 month.  If she had adequate maturation then we would plan second stage transposition.  Also explained that at the time of surgery we may feel that her vein is inadequate for fistula attempt and we would place a left upper arm AV Gore-Tex graft.            Current Facility-Administered Medications  Medication Dose Route Frequency Provider Last Rate Last Admin   0.9 %  sodium chloride infusion    Intravenous Continuous Battula, Rajamani C, MD       0.9 %  sodium chloride infusion   Intravenous Continuous Salah Burlison, Arvilla Meres, MD       ceFAZolin (ANCEF) IVPB 2g/100 mL premix  2 g Intravenous 30 min Pre-Op Jamaurie Bernier, Arvilla Meres, MD       chlorhexidine (HIBICLENS) 4 % liquid 4 application.  60 mL Topical Once Eythan Jayne, Arvilla Meres, MD        And   [START ON 04/30/2022] chlorhexidine (HIBICLENS) 4 % liquid 4 application.  60 mL Topical Once Trystin Terhune, Arvilla Meres, MD       chlorhexidine (PERIDEX) 0.12 % solution 15 mL  15 mL Mouth/Throat Once Battula, Alisa Graff, MD        Or   MEDLINE mouth rinse  15 mL Mouth Rinse Once Denese Killings, MD            PHYSICAL EXAM:     Vitals:    04/29/22 0655 04/29/22 0700  BP: (!) 198/125 (!) 190/143  Pulse: 61 65  Resp:   (!) 24  Temp: 98.4 F (36.9 C)    TempSrc: Oral  SpO2: 100% 100%  Weight: 55.3 kg    Height: 5\' 7"  (1.702 m)        GENERAL: The patient is a well-nourished female, in no acute distress. The vital signs are documented above. Palpable radial pulses bilaterally.  Small surface veins bilaterally   MEDICAL ISSUES: End-stage renal disease with plan for outpatient access.  Presented with extreme hypertension and was not felt safe to proceed.  We will reschedule once better controlled hypertension.  She is on DOAC for pulmonary embolus in March.  We will hold her anticoagulation prior to the next surgery     [name], MD FACS [facility] [phone]   Note: Portions of this report may have been transcribed using voice recognition software.  Every effort has been made to ensure accuracy; however, inadvertent computerized transcription errors may still be present.           Addendum:  The patient has been re-examined and re-evaluated.  The patient's history and physical has been reviewed and is unchanged.    Cathy [name] is a 41 y.o. female is being admitted with ESRD. All the risks,  benefits and other treatment options have been discussed with the patient. The patient has consented to proceed with Procedure(s): LEFT ARM ARTERIOVENOUS (AV) FISTULA VERSUS ARTERIOVENOUS GRAFT CREATION as a surgical intervention.  Jyoti Harju 06/10/2022 7:26 AM V[facility]

## 2022-06-10 NOTE — Anesthesia Preprocedure Evaluation (Signed)
Anesthesia Evaluation  Patient identified by MRN, date of birth, ID band Patient awake    Reviewed: Allergy & Precautions, Patient's Chart, lab work & pertinent test results  Airway Mallampati: II  TM Distance: >3 FB Neck ROM: Full    Dental no notable dental hx.    Pulmonary asthma , Current Smoker and Patient abstained from smoking.,    Pulmonary exam normal        Cardiovascular Exercise Tolerance: Good hypertension, +CHF  Normal cardiovascular exam     Neuro/Psych Anxiety Depression Bipolar Disorder negative neurological ROS     GI/Hepatic Neg liver ROS, GERD  ,  Endo/Other  negative endocrine ROS  Renal/GU Renal disease     Musculoskeletal negative musculoskeletal ROS (+)   Abdominal Normal abdominal exam  (+)   Peds  Hematology  (+) Blood dyscrasia, anemia ,   Anesthesia Other Findings   Reproductive/Obstetrics                             Anesthesia Physical Anesthesia Plan  ASA: 4  Anesthesia Plan: General   Post-op Pain Management:    Induction:   PONV Risk Score and Plan: 1 and TIVA  Airway Management Planned: Nasal Cannula  Additional Equipment:   Intra-op Plan:   Post-operative Plan:   Informed Consent: I have reviewed the patients History and Physical, chart, labs and discussed the procedure including the risks, benefits and alternatives for the proposed anesthesia with the patient or authorized representative who has indicated his/her understanding and acceptance.       Plan Discussed with: CRNA  Anesthesia Plan Comments:         Anesthesia Quick Evaluation

## 2022-06-11 ENCOUNTER — Telehealth: Payer: Self-pay

## 2022-06-11 ENCOUNTER — Encounter (HOSPITAL_COMMUNITY): Payer: Self-pay | Admitting: Vascular Surgery

## 2022-06-11 NOTE — Telephone Encounter (Signed)
Received message from [place] at [facility] and wellness.  Ms. Dake needs a list of providers in [facility] that are accepting new patients. She is insured with [facility].  Will send per text at Ms. Abramovich' request the following providers to contact.  1). [facility]  [address], [place] [address] [place]  2) [facility] [address] [place] [address]: Phone [phone]  3) [facility] [place] [address]: Phone [phone]  4) [place] [address], [place], [place] [address] Phone: [place] RN [facility] [place]

## 2022-06-12 DIAGNOSIS — Z992 Dependence on renal dialysis: Secondary | ICD-10-CM | POA: Diagnosis not present

## 2022-06-12 DIAGNOSIS — N2581 Secondary hyperparathyroidism of renal origin: Secondary | ICD-10-CM | POA: Diagnosis not present

## 2022-06-12 DIAGNOSIS — N186 End stage renal disease: Secondary | ICD-10-CM | POA: Diagnosis not present

## 2022-06-12 DIAGNOSIS — Z7689 Persons encountering health services in other specified circumstances: Secondary | ICD-10-CM | POA: Diagnosis not present

## 2022-06-14 DIAGNOSIS — N186 End stage renal disease: Secondary | ICD-10-CM | POA: Diagnosis not present

## 2022-06-14 DIAGNOSIS — Z7689 Persons encountering health services in other specified circumstances: Secondary | ICD-10-CM | POA: Diagnosis not present

## 2022-06-14 DIAGNOSIS — N2581 Secondary hyperparathyroidism of renal origin: Secondary | ICD-10-CM | POA: Diagnosis not present

## 2022-06-14 DIAGNOSIS — Z992 Dependence on renal dialysis: Secondary | ICD-10-CM | POA: Diagnosis not present

## 2022-06-17 ENCOUNTER — Telehealth: Payer: Self-pay | Admitting: Vascular Surgery

## 2022-06-17 DIAGNOSIS — Z992 Dependence on renal dialysis: Secondary | ICD-10-CM | POA: Diagnosis not present

## 2022-06-17 DIAGNOSIS — N2581 Secondary hyperparathyroidism of renal origin: Secondary | ICD-10-CM | POA: Diagnosis not present

## 2022-06-17 DIAGNOSIS — N186 End stage renal disease: Secondary | ICD-10-CM | POA: Diagnosis not present

## 2022-06-17 DIAGNOSIS — Z7689 Persons encountering health services in other specified circumstances: Secondary | ICD-10-CM | POA: Diagnosis not present

## 2022-06-17 NOTE — Telephone Encounter (Signed)
-----   Message from JOWANNA LOEFFLER, [place] sent at 06/13/2022  9:17 AM EDT -----  ----- Message ----- From: [name], MD Sent: 06/10/2022   9:29 AM EDT To: Marvia Pickles; [facility] Admin Pool; #   Left first stage brachiobasilic fistula creation.  Tourist information centre manager.  I need to see her in 4 to 6 weeks with duplex ultrasound

## 2022-06-19 DIAGNOSIS — N2581 Secondary hyperparathyroidism of renal origin: Secondary | ICD-10-CM | POA: Diagnosis not present

## 2022-06-19 DIAGNOSIS — N186 End stage renal disease: Secondary | ICD-10-CM | POA: Diagnosis not present

## 2022-06-19 DIAGNOSIS — Z992 Dependence on renal dialysis: Secondary | ICD-10-CM | POA: Diagnosis not present

## 2022-06-19 DIAGNOSIS — Z7689 Persons encountering health services in other specified circumstances: Secondary | ICD-10-CM | POA: Diagnosis not present

## 2022-06-21 DIAGNOSIS — Z992 Dependence on renal dialysis: Secondary | ICD-10-CM | POA: Diagnosis not present

## 2022-06-21 DIAGNOSIS — N186 End stage renal disease: Secondary | ICD-10-CM | POA: Diagnosis not present

## 2022-06-21 DIAGNOSIS — Z7689 Persons encountering health services in other specified circumstances: Secondary | ICD-10-CM | POA: Diagnosis not present

## 2022-06-21 DIAGNOSIS — N2581 Secondary hyperparathyroidism of renal origin: Secondary | ICD-10-CM | POA: Diagnosis not present

## 2022-06-24 DIAGNOSIS — Z7689 Persons encountering health services in other specified circumstances: Secondary | ICD-10-CM | POA: Diagnosis not present

## 2022-06-24 DIAGNOSIS — N2581 Secondary hyperparathyroidism of renal origin: Secondary | ICD-10-CM | POA: Diagnosis not present

## 2022-06-24 DIAGNOSIS — N186 End stage renal disease: Secondary | ICD-10-CM | POA: Diagnosis not present

## 2022-06-24 DIAGNOSIS — Z992 Dependence on renal dialysis: Secondary | ICD-10-CM | POA: Diagnosis not present

## 2022-06-30 DIAGNOSIS — M3214 Glomerular disease in systemic lupus erythematosus: Secondary | ICD-10-CM | POA: Diagnosis not present

## 2022-06-30 DIAGNOSIS — Z992 Dependence on renal dialysis: Secondary | ICD-10-CM | POA: Diagnosis not present

## 2022-06-30 DIAGNOSIS — N186 End stage renal disease: Secondary | ICD-10-CM | POA: Diagnosis not present

## 2022-07-02 ENCOUNTER — Emergency Department (HOSPITAL_COMMUNITY)
Admission: EM | Admit: 2022-07-02 | Discharge: 2022-07-02 | Disposition: A | Discharge status: Home / Self Care | Payer: Medicare HMO | Attending: Student | Admitting: Student

## 2022-07-02 ENCOUNTER — Other Ambulatory Visit: Payer: Self-pay

## 2022-07-02 ENCOUNTER — Encounter (HOSPITAL_COMMUNITY): Payer: Self-pay

## 2022-07-02 DIAGNOSIS — I120 Hypertensive chronic kidney disease with stage 5 chronic kidney disease or end stage renal disease: Secondary | ICD-10-CM | POA: Insufficient documentation

## 2022-07-02 DIAGNOSIS — R519 Headache, unspecified: Secondary | ICD-10-CM | POA: Insufficient documentation

## 2022-07-02 DIAGNOSIS — Z79899 Other long term (current) drug therapy: Secondary | ICD-10-CM | POA: Diagnosis not present

## 2022-07-02 DIAGNOSIS — R1084 Generalized abdominal pain: Secondary | ICD-10-CM | POA: Diagnosis not present

## 2022-07-02 DIAGNOSIS — Z992 Dependence on renal dialysis: Secondary | ICD-10-CM | POA: Insufficient documentation

## 2022-07-02 DIAGNOSIS — N186 End stage renal disease: Secondary | ICD-10-CM | POA: Insufficient documentation

## 2022-07-02 DIAGNOSIS — R7989 Other specified abnormal findings of blood chemistry: Secondary | ICD-10-CM | POA: Insufficient documentation

## 2022-07-02 DIAGNOSIS — J45909 Unspecified asthma, uncomplicated: Secondary | ICD-10-CM | POA: Diagnosis not present

## 2022-07-02 DIAGNOSIS — R1013 Epigastric pain: Secondary | ICD-10-CM | POA: Diagnosis present

## 2022-07-02 DIAGNOSIS — R000 Tachycardia, unspecified: Secondary | ICD-10-CM | POA: Diagnosis not present

## 2022-07-02 DIAGNOSIS — R197 Diarrhea, unspecified: Secondary | ICD-10-CM | POA: Insufficient documentation

## 2022-07-02 DIAGNOSIS — Z7901 Long term (current) use of anticoagulants: Secondary | ICD-10-CM | POA: Insufficient documentation

## 2022-07-02 LAB — CBC WITH DIFFERENTIAL/PLATELET
Abs Immature Granulocytes: 0.03 10*3/uL (ref 0.00–0.07)
Basophils Absolute: 0 10*3/uL (ref 0.0–0.1)
Basophils Relative: 0 %
Eosinophils Absolute: 0.3 10*3/uL (ref 0.0–0.5)
Eosinophils Relative: 5 %
HCT: 34.3 % — ABNORMAL LOW (ref 36.0–46.0)
Hemoglobin: 10.7 g/dL — ABNORMAL LOW (ref 12.0–15.0)
Immature Granulocytes: 1 %
Lymphocytes Relative: 23 %
Lymphs Abs: 1.2 10*3/uL (ref 0.7–4.0)
MCH: 28.2 pg (ref 26.0–34.0)
MCHC: 31.2 g/dL (ref 30.0–36.0)
MCV: 90.5 fL (ref 80.0–100.0)
Monocytes Absolute: 0.6 10*3/uL (ref 0.1–1.0)
Monocytes Relative: 11 %
Neutro Abs: 3.1 10*3/uL (ref 1.7–7.7)
Neutrophils Relative %: 60 %
Platelets: 179 10*3/uL (ref 150–400)
RBC: 3.79 MIL/uL — ABNORMAL LOW (ref 3.87–5.11)
RDW: 19.9 % — ABNORMAL HIGH (ref 11.5–15.5)
WBC: 5.2 10*3/uL (ref 4.0–10.5)
nRBC: 0 % (ref 0.0–0.2)

## 2022-07-02 LAB — COMPREHENSIVE METABOLIC PANEL
ALT: 55 U/L — ABNORMAL HIGH (ref 0–44)
AST: 40 U/L (ref 15–41)
Albumin: 3.5 g/dL (ref 3.5–5.0)
Alkaline Phosphatase: 250 U/L — ABNORMAL HIGH (ref 38–126)
Anion gap: 14 (ref 5–15)
BUN: 100 mg/dL — ABNORMAL HIGH (ref 6–20)
CO2: 20 mmol/L — ABNORMAL LOW (ref 22–32)
Calcium: 8.3 mg/dL — ABNORMAL LOW (ref 8.9–10.3)
Chloride: 105 mmol/L (ref 98–111)
Creatinine, Ser: 18.18 mg/dL — ABNORMAL HIGH (ref 0.44–1.00)
GFR, Estimated: 2 mL/min — ABNORMAL LOW (ref >60)
Glucose, Bld: 91 mg/dL (ref 70–99)
Potassium: 6.6 mmol/L (ref 3.5–5.1)
Sodium: 139 mmol/L (ref 135–145)
Total Bilirubin: 0.6 mg/dL (ref 0.3–1.2)
Total Protein: 7.4 g/dL (ref 6.5–8.1)

## 2022-07-02 LAB — CBG MONITORING, ED: Glucose-Capillary: 82 mg/dL (ref 70–99)

## 2022-07-02 LAB — HEPATITIS B CORE ANTIBODY, TOTAL: Hep B Core Total Ab: NONREACTIVE

## 2022-07-02 LAB — HEPATITIS B SURFACE ANTIBODY,QUALITATIVE: Hep B S Ab: REACTIVE — AB

## 2022-07-02 LAB — HEPATITIS C ANTIBODY: HCV Ab: NONREACTIVE

## 2022-07-02 LAB — HEPATITIS B SURFACE ANTIGEN: Hepatitis B Surface Ag: NONREACTIVE

## 2022-07-02 MED ORDER — ALTEPLASE 2 MG IJ SOLR: 2.0000 mg | Freq: Once | INTRAMUSCULAR | Status: DC | PRN

## 2022-07-02 MED ORDER — HEPARIN SODIUM (PORCINE) 1000 UNIT/ML IJ SOLN
INTRAMUSCULAR | Status: AC
  Filled 2022-07-02: qty 5

## 2022-07-02 MED ORDER — ONDANSETRON HCL 4 MG/2ML IJ SOLN
4.0000 mg | Freq: Once | INTRAMUSCULAR | Status: AC
  Administered 2022-07-02: 4 mg via INTRAVENOUS
  Filled 2022-07-02: qty 2

## 2022-07-02 MED ORDER — DIPHENHYDRAMINE HCL 50 MG/ML IJ SOLN
25.0000 mg | INTRAMUSCULAR | Status: DC
Start: 2022-07-02 — End: 2022-07-02

## 2022-07-02 MED ORDER — CHLORHEXIDINE GLUCONATE CLOTH 2 % EX PADS: 6.0000 | MEDICATED_PAD | Freq: Every day | CUTANEOUS | Status: DC

## 2022-07-02 MED ORDER — HEPARIN SODIUM (PORCINE) 1000 UNIT/ML DIALYSIS: 1000.0000 [IU] | INTRAMUSCULAR | Status: DC | PRN

## 2022-07-02 MED ORDER — DIPHENHYDRAMINE HCL 50 MG/ML IJ SOLN
INTRAMUSCULAR | Status: AC
  Administered 2022-07-02: 25 mg via INTRAVENOUS
  Filled 2022-07-02: qty 1

## 2022-07-02 MED ORDER — DIPHENHYDRAMINE HCL 50 MG/ML IJ SOLN: 25.0000 mg | INTRAMUSCULAR | Status: DC

## 2022-07-02 MED ORDER — MORPHINE SULFATE (PF) 2 MG/ML IV SOLN
2.0000 mg | Freq: Once | INTRAVENOUS | Status: AC
  Administered 2022-07-02: 2 mg via INTRAVENOUS
  Filled 2022-07-02: qty 1

## 2022-07-02 NOTE — ED Triage Notes (Signed)
Patient states she is here due to medical clearance to go to dialysis since she has missed her Thu, Sat, and Tues. Patient complaining of abdominal pain with nausea an diarrhea.

## 2022-07-02 NOTE — Progress Notes (Signed)
Patient ID: Cathy [name], female   DOB: 14-Jan-1981, 41 y.o.   MRN: [id number] S: Notified that patient has missed her last 3 HD sessions and was sent to [facility] ED to be evaluated.  She reports that [facility] did not pick her up to take her to HD.  She also reports some abdominal pain and diarrhea.  Labs notable for K of 6.6.  BUN pending. O:Ht 5\' 7"  (1.702 m)   Wt 56.7 kg   LMP 06/05/2020 Comment: HCG negative 06/10/2022  BMI 19.58 kg/m  No intake or output data in the 24 hours ending 07/02/22 1138 Intake/Output: No intake/output data recorded.  Intake/Output this shift:  No intake/output data recorded. Weight change:  Gen:NAD CVS: bradycardic at 58 Resp: CTA Abd: distended, +BS, soft, NT Ext: no edema LUE AVF +T/B  Recent Labs  Lab 07/02/22 1034  NA 139  K 6.6*  CL 105  CO2 20*  GLUCOSE 91  BUN PENDING  CREATININE 18.18*  ALBUMIN 3.5  CALCIUM 8.3*  AST 40  ALT 55*   Liver Function Tests: Recent Labs  Lab 07/02/22 1034  AST 40  ALT 55*  ALKPHOS 250*  BILITOT 0.6  PROT 7.4  ALBUMIN 3.5   No results for input(s): "LIPASE", "AMYLASE" in the last 168 hours. No results for input(s): "AMMONIA" in the last 168 hours. CBC: Recent Labs  Lab 07/02/22 1034  WBC 5.2  NEUTROABS 3.1  HGB 10.7*  HCT 34.3*  MCV 90.5  PLT 179   Cardiac Enzymes: No results for input(s): "CKTOTAL", "CKMB", "CKMBINDEX", "TROPONINI" in the last 168 hours. CBG: Recent Labs  Lab 07/02/22 1115  GLUCAP 82    Iron Studies: No results for input(s): "IRON", "TIBC", "TRANSFERRIN", "FERRITIN" in the last 72 hours. Studies/Results: No results found.  Chlorhexidine Gluconate Cloth  6 each Topical Q0600    BMET    Component Value Date/Time   NA 139 07/02/2022 1034   NA 136 12/10/2018 1142   K 6.6 (HH) 07/02/2022 1034   CL 105 07/02/2022 1034   CO2 20 (L) 07/02/2022 1034   GLUCOSE 91 07/02/2022 1034   BUN PENDING 07/02/2022 1034   BUN 9 12/10/2018 1142   CREATININE 18.18 (H) 07/02/2022  1034   CALCIUM 8.3 (L) 07/02/2022 1034   GFRNONAA 2 (L) 07/02/2022 1034   GFRAA >60 08/07/2020 1209   CBC    Component Value Date/Time   WBC 5.2 07/02/2022 1034   RBC 3.79 (L) 07/02/2022 1034   HGB 10.7 (L) 07/02/2022 1034   HGB 6.6 (L) 12/04/2020 1158   HCT 34.3 (L) 07/02/2022 1034   HCT 21.0 (L) 01/12/2019 0904   PLT 179 07/02/2022 1034   PLT 228 01/12/2019 0904   MCV 90.5 07/02/2022 1034   MCV 92 01/12/2019 0904   MCH 28.2 07/02/2022 1034   MCHC 31.2 07/02/2022 1034   RDW 19.9 (H) 07/02/2022 1034   RDW 13.9 01/12/2019 0904   LYMPHSABS 1.2 07/02/2022 1034   LYMPHSABS 0.7 10/04/2018 1131   MONOABS 0.6 07/02/2022 1034   EOSABS 0.3 07/02/2022 1034   EOSABS 0.0 10/04/2018 1131   BASOSABS 0.0 07/02/2022 1034   BASOSABS 0.0 10/04/2018 1131   Dialyzes at [facility] TTS 4 hrs EDW 55.5 kg F 180 BFR 400 mL/min DFR 800 mL/min 2k/ 2 ca bath Hectorol 2 mcg q rx Venofer 100 mg q rx (hasn't started yet) Mircera 200 mg q 2 weeks  Assessment/Plan:  Hyperkalemia - due to missed HD.  Will plan for urgent  HD today with low K bath.  Hopefully she  can be discharged after HD and attend outpatient HD tomorrow.  BUN is 100, will decrease BFR to 300 to prevent dialysis disequilibrium.    ESRD - needs to get back on TTS schedule. Anemia of ESKD - stable Disposition - hopeful d/c to home after HD.  Donetta Potts, MD [facility]

## 2022-07-02 NOTE — Procedures (Signed)
  EMERGENT HEMODIALYSIS TREATMENT NOTE:   Received patient in stretcher from ED to unit.  Alert and oriented.  Informed consent for HD obtained.  Treatment initiated: 5625 Treatment completed: 1615  Patient tolerated well.  Transported back to the ED alert, without acute distress.  Hand-off given to Terance Hart, EMT-P   Access used: right IJ TDC, site unremarkable. Access issues: Lines reversed d/t sluggish aspiration from art port.  Total UF removed: 3 liters Medication(s) given: Diphenhydramine 25mg  IVP Post HD VS: 202/100 - EDP notified Post HD weight: 53.5kg   [name]

## 2022-07-02 NOTE — ED Provider Notes (Signed)
[facility] EMERGENCY DEPARTMENT Provider Note   CSN: [id number] Arrival date & time: 07/02/22  6440     History  Chief Complaint  Patient presents with   Abdominal Pain    Cathy [name] is a 41 y.o. female. 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  States that she missed three days of dialysis Thursday, Saturday and Tuesday. She reports transportation issues. Long history of non-compliance on dialysis. She states she is having abdominal cramping in her epigastric region as well as diarrhea today. She endorses having headache and "breathing difficulty" and feels "woozy." She denies chest pain, palpitations. Denies fevers.   HPI     Home Medications Prior to Admission medications   Medication Sig Start Date End Date Taking? Authorizing Provider  acetaminophen (TYLENOL) 500 MG tablet Take 1,000 mg by mouth every 6 (six) hours as needed for moderate pain.    [provider]  albuterol (VENTOLIN HFA) 108 (90 Base) MCG/ACT inhaler Inhale 2 puffs into the lungs every 6 (six) hours as needed for wheezing or shortness of breath. 12/17/21 12/17/22  Rai, Ripudeep K, MD  amLODipine (NORVASC) 10 MG tablet Take 1 tablet (10 mg total) by mouth daily. 03/05/22   Orson Eva, MD  apixaban (ELIQUIS) 5 MG TABS tablet Take 1 tablet (5 mg total) by mouth 2 (two) times daily. 03/05/22   Orson Eva, MD  Blood Pressure Monitoring (OMRON 3 SERIES BP MONITOR) DEVI Use as directed 2 times a day 01/09/22   Shelly Coss, MD  calcium acetate (PHOSLO) 667 MG capsule Take 2 capsules (1,334 mg total) by mouth 3 (three) times daily with meals. 03/05/22   Orson Eva, MD  carvedilol (COREG) 25 MG tablet Take 1 tablet (25 mg total) by mouth 2 (two) times daily with a meal. 03/05/22   Tat, Shanon Brow, MD  cloNIDine (CATAPRES) 0.2 MG tablet Take 1 tablet (0.2 mg total) by mouth every 8  (eight) hours. 03/05/22   Orson Eva, MD  Doxercalciferol (HECTOROL IV) Doxercalciferol (Hectorol) 03/06/22 03/05/23  [provider]  guaiFENesin (MUCINEX) 600 MG 12 hr tablet Take 600 mg by mouth 2 (two) times daily as needed for cough.    [provider]  hydrALAZINE (APRESOLINE) 100 MG tablet Take 1 tablet (100 mg total) by mouth 3 (three) times daily. 03/05/22   Orson Eva, MD  ipratropium (ATROVENT HFA) 17 MCG/ACT inhaler Inhale 2 puffs into the lungs every 6 (six) hours as needed for wheezing. 12/17/21 12/17/22  Rai, Vernelle Emerald, MD  losartan (COZAAR) 100 MG tablet Take 1 tablet (100 mg total) by mouth at bedtime. 03/05/22   Orson Eva, MD  traMADol (ULTRAM) 50 MG tablet Take 1 tablet (50 mg total) by mouth every 6 (six) hours as needed. 06/10/22   [name], MD  fenofibrate 160 MG tablet Take 1 tablet (160 mg total) by mouth daily. 12/31/20 01/20/21  Regalado, Jerald Kief A, MD      Allergies    Oxycodone    Review of Systems   Review of Systems  Respiratory:  Positive for shortness of breath.   Gastrointestinal:  Positive for abdominal pain, diarrhea and nausea.  Neurological:  Positive for headaches.  All other systems reviewed and are negative.   Physical Exam Updated Vital Signs Ht 5\' 7"  (1.702 m)   Wt 56.7 kg   LMP 06/05/2020 Comment: HCG negative 06/10/2022  BMI 19.58 kg/m  Physical Exam Vitals and nursing note reviewed.  Constitutional:      Appearance: Normal appearance. She is ill-appearing.     Comments: Chronically ill appearing   HENT:     Head: Normocephalic.     Mouth/Throat:     Mouth: Mucous membranes are moist.     Pharynx: Oropharynx is clear.  Eyes:     General: No scleral icterus.    Extraocular Movements: Extraocular movements intact.     Pupils: Pupils are equal, round, and reactive to light.  Cardiovascular:     Rate and Rhythm: Regular rhythm. Bradycardia present.     Heart sounds: Normal heart sounds. No murmur heard. Pulmonary:      Effort: Pulmonary effort is normal. No respiratory distress.     Breath sounds: Normal breath sounds. No wheezing, rhonchi or rales.  Abdominal:     General: Abdomen is protuberant. Bowel sounds are normal.     Palpations: Abdomen is soft.     Tenderness: There is generalized abdominal tenderness.  Musculoskeletal:        General: Normal range of motion.     Cervical back: Neck supple.  Skin:    General: Skin is warm and dry.     Capillary Refill: Capillary refill takes less than 2 seconds.  Neurological:     General: No focal deficit present.     Mental Status: She is alert and oriented to person, place, and time. Mental status is at baseline.  Psychiatric:        Mood and Affect: Mood normal.        Behavior: Behavior normal.        Thought Content: Thought content normal.        Judgment: Judgment normal.    ED Results / Procedures / Treatments   Labs (all labs ordered are listed, but only abnormal results are displayed) Labs Reviewed  COMPREHENSIVE METABOLIC PANEL - Abnormal; Notable for the following components:      Result Value   Potassium 6.6 (*)    CO2 20 (*)    BUN 100 (*)    Creatinine, Ser 18.18 (*)    Calcium 8.3 (*)    ALT 55 (*)    Alkaline Phosphatase 250 (*)    GFR, Estimated 2 (*)    All other components within normal limits  CBC WITH DIFFERENTIAL/PLATELET - Abnormal; Notable for the following components:   RBC 3.79 (*)    Hemoglobin 10.7 (*)    HCT 34.3 (*)    RDW 19.9 (*)    All other components within normal limits  HEPATITIS B SURFACE ANTIBODY,QUALITATIVE - Abnormal; Notable for the following components:   Hep B S Ab Reactive (*)    All other components within normal limits  HEPATITIS B SURFACE ANTIGEN  HEPATITIS B CORE ANTIBODY, TOTAL  HEPATITIS C ANTIBODY  HEPATITIS B SURFACE ANTIBODY, QUANTITATIVE  CBG MONITORING, ED   EKG None  Radiology No results found.  Procedures Procedures   Medications Ordered in ED Medications   Chlorhexidine Gluconate Cloth 2 % PADS 6 each (has no administration in time range)  heparin injection 1,000 Units (has no administration in time range)  alteplase (CATHFLO ACTIVASE) injection 2 mg (has no administration in time range)  diphenhydrAMINE (BENADRYL) injection 25 mg (25 mg Intravenous Given 07/02/22 1345)  heparin sodium (porcine) 1000 UNIT/ML injection (has no administration in time range)  ondansetron (ZOFRAN) injection 4 mg (4 mg Intravenous Given 07/02/22 1057)  morphine (  PF) 2 MG/ML injection 2 mg (2 mg Intravenous Given 07/02/22 1217)    ED Course/ Medical Decision Making/ A&P                           Medical Decision Making Amount and/or Complexity of Data Reviewed Labs: ordered.  Risk Prescription drug management.  This patient presents to the ED for concern of abdominal pain, this involves an extensive number of treatment options, and is a complaint that carries with it a high risk of complications and morbidity.  The differential diagnosis includes Acute hepatobiliary disease, pancreatitis, appendicitis, PUD, gastritis, SBO, diverticulitis, colitis, viral gastroenteritis, Crohn's, UC, electrolyte dysfunction  Co morbidities that complicate the patient evaluation Malignant hypertension, asthma, lupus, lupus nephritis, GERD   Additional history obtained:  Additional history obtained from: none  External records from outside source obtained and reviewed including: most recent 2 admission discharge summary physician notes  EKG: EKG: normal EKG, normal sinus rhythm, peaked t waves .   Cardiac Monitoring: The patient was maintained on a cardiac monitor.  I personally viewed and interpreted the cardiac monitored which showed an underlying rhythm of: sinus rhythm  Lab Results: I personally ordered, reviewed, and interpreted labs. Pertinent results include: CBC without leukocytosis. Hemoglobin 10.7, stable CMP K+ 6.6, bicarb 20, elevated creatinine/BUN. Missed 3  dialysis treatments. EKG changes with peaked T waves without chest pain.  BG 82  Medications  I ordered medication including morphine and zofran for pain and nausea Reevaluation of the patient after medication shows that patient improved -I reviewed the patient's home medications and did not make adjustments. -I did not prescribe new home medications.  Tests Considered: N/A  Critical Interventions: Dialysis  Consultations: I requested consultation with the nephrologist, Dr. Marval Regal,  and discussed lab and imaging findings as well as pertinent plan - they recommend: dialysis today and d/c after   SDH Non-compliance on dialysis   ED Course:  41 year old female who presents to the emergency department after missing 3 dialysis treatments with abdominal cramping and nausea.  She has malignant hypertension and presents with elevated BP. Known patient to the ED with frequent missed dialysis.  Basic labs drawn as well as EKG for electrolyte derangement.  Given zofran and morphine with improvement in her symptoms. Labs with K 6.6, she does have some peaked T waves on her EKG without arrhythmia or chest pain. Spoke with Dr. Marval Regal, nephrology who has placed dialysis orders as this is definitive treatment of her symptoms. I have low suspicion that abdominal cramping is being caused by other sources such as appendicitis, pancreatitis, acute hepatobiliary disease, gastroenteritis, diverticulitis, PUD etc. No urinary or vaginal symptoms.  Patient was sent to dialysis and returned to ED after full treatment. She is feeling improved. Dr. Marval Regal has also placed orders for her to have dialysis tomorrow at her outpatient dialysis center. She is aware that she needs to attend. Given return precautions for worsening symptoms. She verbalizes understanding. Continues to be hemodynamically stable after dialysis and ready for dc.   After consideration of the diagnostic results and the patients  response to treatment, I feel that the patent would benefit from discharge. The patient has been appropriately medically screened and/or stabilized in the ED. I have low suspicion for any other emergent medical condition which would require further screening, evaluation or treatment in the ED or require inpatient management. The patient is overall well appearing and non-toxic in appearance. They are hemodynamically stable at  time of discharge.   Final Clinical Impression(s) / ED Diagnoses Final diagnoses:  Generalized abdominal pain    Rx / DC Orders ED Discharge Orders     None         Mickie Hillier, PA-C 07/03/22 1204    Kommor, [place], MD 07/03/22 2135

## 2022-07-02 NOTE — Discharge Instructions (Addendum)
You were seen in the emergency department today for abdominal pain after missing her dialysis.  He received a full treatment while you were here in the emergency department.  You will have a treatment tomorrow as well at your dialysis facility.  Please return for any worsening symptoms or chest pain, shortness of breath.

## 2022-07-02 NOTE — ED Notes (Signed)
Date and time results received: 07/02/22 11:30 AM    Test: potassium Critical Value: 6.6  Name of Provider Notified: Lauren PA  Orders Received? Or Actions Taken?: see orders

## 2022-07-03 DIAGNOSIS — Z992 Dependence on renal dialysis: Secondary | ICD-10-CM | POA: Diagnosis not present

## 2022-07-03 DIAGNOSIS — N186 End stage renal disease: Secondary | ICD-10-CM | POA: Diagnosis not present

## 2022-07-03 DIAGNOSIS — N2581 Secondary hyperparathyroidism of renal origin: Secondary | ICD-10-CM | POA: Diagnosis not present

## 2022-07-03 LAB — HEPATITIS B SURFACE ANTIBODY, QUANTITATIVE: Hep B S AB Quant (Post): 100.7 m[IU]/mL (ref >9.9)

## 2022-07-04 NOTE — Telephone Encounter (Signed)
Scheduled appt on 8/23

## 2022-07-08 DIAGNOSIS — Z7689 Persons encountering health services in other specified circumstances: Secondary | ICD-10-CM | POA: Diagnosis not present

## 2022-07-08 DIAGNOSIS — N2581 Secondary hyperparathyroidism of renal origin: Secondary | ICD-10-CM | POA: Diagnosis not present

## 2022-07-08 DIAGNOSIS — Z992 Dependence on renal dialysis: Secondary | ICD-10-CM | POA: Diagnosis not present

## 2022-07-08 DIAGNOSIS — N186 End stage renal disease: Secondary | ICD-10-CM | POA: Diagnosis not present

## 2022-07-12 DIAGNOSIS — N186 End stage renal disease: Secondary | ICD-10-CM | POA: Diagnosis not present

## 2022-07-12 DIAGNOSIS — N2581 Secondary hyperparathyroidism of renal origin: Secondary | ICD-10-CM | POA: Diagnosis not present

## 2022-07-12 DIAGNOSIS — Z992 Dependence on renal dialysis: Secondary | ICD-10-CM | POA: Diagnosis not present

## 2022-07-15 DIAGNOSIS — N186 End stage renal disease: Secondary | ICD-10-CM | POA: Diagnosis not present

## 2022-07-15 DIAGNOSIS — Z7689 Persons encountering health services in other specified circumstances: Secondary | ICD-10-CM | POA: Diagnosis not present

## 2022-07-15 DIAGNOSIS — Z992 Dependence on renal dialysis: Secondary | ICD-10-CM | POA: Diagnosis not present

## 2022-07-15 DIAGNOSIS — N2581 Secondary hyperparathyroidism of renal origin: Secondary | ICD-10-CM | POA: Diagnosis not present

## 2022-07-17 ENCOUNTER — Other Ambulatory Visit: Payer: Self-pay | Admitting: *Deleted

## 2022-07-17 DIAGNOSIS — N2581 Secondary hyperparathyroidism of renal origin: Secondary | ICD-10-CM | POA: Diagnosis not present

## 2022-07-17 DIAGNOSIS — Z7689 Persons encountering health services in other specified circumstances: Secondary | ICD-10-CM | POA: Diagnosis not present

## 2022-07-17 DIAGNOSIS — N186 End stage renal disease: Secondary | ICD-10-CM | POA: Diagnosis not present

## 2022-07-17 DIAGNOSIS — Z992 Dependence on renal dialysis: Secondary | ICD-10-CM | POA: Diagnosis not present

## 2022-07-19 DIAGNOSIS — N186 End stage renal disease: Secondary | ICD-10-CM | POA: Diagnosis not present

## 2022-07-19 DIAGNOSIS — Z992 Dependence on renal dialysis: Secondary | ICD-10-CM | POA: Diagnosis not present

## 2022-07-19 DIAGNOSIS — Z7689 Persons encountering health services in other specified circumstances: Secondary | ICD-10-CM | POA: Diagnosis not present

## 2022-07-19 DIAGNOSIS — N2581 Secondary hyperparathyroidism of renal origin: Secondary | ICD-10-CM | POA: Diagnosis not present

## 2022-07-23 ENCOUNTER — Encounter: Payer: Medicare HMO | Admitting: Vascular Surgery

## 2022-07-24 DIAGNOSIS — Z992 Dependence on renal dialysis: Secondary | ICD-10-CM | POA: Diagnosis not present

## 2022-07-24 DIAGNOSIS — Z7689 Persons encountering health services in other specified circumstances: Secondary | ICD-10-CM | POA: Diagnosis not present

## 2022-07-24 DIAGNOSIS — N2581 Secondary hyperparathyroidism of renal origin: Secondary | ICD-10-CM | POA: Diagnosis not present

## 2022-07-24 DIAGNOSIS — N186 End stage renal disease: Secondary | ICD-10-CM | POA: Diagnosis not present

## 2022-07-29 DIAGNOSIS — N2581 Secondary hyperparathyroidism of renal origin: Secondary | ICD-10-CM | POA: Diagnosis not present

## 2022-07-29 DIAGNOSIS — Z992 Dependence on renal dialysis: Secondary | ICD-10-CM | POA: Diagnosis not present

## 2022-07-29 DIAGNOSIS — Z7689 Persons encountering health services in other specified circumstances: Secondary | ICD-10-CM | POA: Diagnosis not present

## 2022-07-29 DIAGNOSIS — N186 End stage renal disease: Secondary | ICD-10-CM | POA: Diagnosis not present

## 2022-08-02 DIAGNOSIS — Z992 Dependence on renal dialysis: Secondary | ICD-10-CM | POA: Diagnosis not present

## 2022-08-02 DIAGNOSIS — N186 End stage renal disease: Secondary | ICD-10-CM | POA: Diagnosis not present

## 2022-08-02 DIAGNOSIS — N2581 Secondary hyperparathyroidism of renal origin: Secondary | ICD-10-CM | POA: Diagnosis not present

## 2022-08-09 ENCOUNTER — Encounter (HOSPITAL_COMMUNITY): Payer: Self-pay | Admitting: Emergency Medicine

## 2022-08-09 ENCOUNTER — Inpatient Hospital Stay (HOSPITAL_COMMUNITY)
Admission: EM | Admit: 2022-08-09 | Discharge: 2022-08-12 | DRG: 304 | Disposition: A | Discharge status: Home / Self Care | Payer: Medicare HMO | Attending: Internal Medicine | Admitting: Internal Medicine

## 2022-08-09 ENCOUNTER — Other Ambulatory Visit: Payer: Self-pay

## 2022-08-09 ENCOUNTER — Emergency Department (HOSPITAL_COMMUNITY): Payer: Medicare HMO

## 2022-08-09 DIAGNOSIS — F419 Anxiety disorder, unspecified: Secondary | ICD-10-CM | POA: Diagnosis present

## 2022-08-09 DIAGNOSIS — R748 Abnormal levels of other serum enzymes: Secondary | ICD-10-CM | POA: Diagnosis present

## 2022-08-09 DIAGNOSIS — K7460 Unspecified cirrhosis of liver: Secondary | ICD-10-CM

## 2022-08-09 DIAGNOSIS — R1310 Dysphagia, unspecified: Secondary | ICD-10-CM

## 2022-08-09 DIAGNOSIS — Z8249 Family history of ischemic heart disease and other diseases of the circulatory system: Secondary | ICD-10-CM | POA: Diagnosis not present

## 2022-08-09 DIAGNOSIS — Z86711 Personal history of pulmonary embolism: Secondary | ICD-10-CM | POA: Diagnosis not present

## 2022-08-09 DIAGNOSIS — Z7901 Long term (current) use of anticoagulants: Secondary | ICD-10-CM

## 2022-08-09 DIAGNOSIS — I5020 Unspecified systolic (congestive) heart failure: Secondary | ICD-10-CM | POA: Diagnosis not present

## 2022-08-09 DIAGNOSIS — R1010 Upper abdominal pain, unspecified: Secondary | ICD-10-CM | POA: Diagnosis not present

## 2022-08-09 DIAGNOSIS — Z833 Family history of diabetes mellitus: Secondary | ICD-10-CM

## 2022-08-09 DIAGNOSIS — Z885 Allergy status to narcotic agent status: Secondary | ICD-10-CM

## 2022-08-09 DIAGNOSIS — I5022 Chronic systolic (congestive) heart failure: Secondary | ICD-10-CM | POA: Diagnosis present

## 2022-08-09 DIAGNOSIS — Z79899 Other long term (current) drug therapy: Secondary | ICD-10-CM | POA: Diagnosis not present

## 2022-08-09 DIAGNOSIS — M3214 Glomerular disease in systemic lupus erythematosus: Secondary | ICD-10-CM | POA: Diagnosis present

## 2022-08-09 DIAGNOSIS — Z23 Encounter for immunization: Secondary | ICD-10-CM

## 2022-08-09 DIAGNOSIS — F17210 Nicotine dependence, cigarettes, uncomplicated: Secondary | ICD-10-CM | POA: Diagnosis present

## 2022-08-09 DIAGNOSIS — R188 Other ascites: Secondary | ICD-10-CM | POA: Diagnosis present

## 2022-08-09 DIAGNOSIS — R932 Abnormal findings on diagnostic imaging of liver and biliary tract: Secondary | ICD-10-CM | POA: Diagnosis not present

## 2022-08-09 DIAGNOSIS — F319 Bipolar disorder, unspecified: Secondary | ICD-10-CM | POA: Diagnosis present

## 2022-08-09 DIAGNOSIS — E875 Hyperkalemia: Secondary | ICD-10-CM | POA: Diagnosis present

## 2022-08-09 DIAGNOSIS — N186 End stage renal disease: Secondary | ICD-10-CM | POA: Diagnosis present

## 2022-08-09 DIAGNOSIS — I132 Hypertensive heart and chronic kidney disease with heart failure and with stage 5 chronic kidney disease, or end stage renal disease: Secondary | ICD-10-CM | POA: Diagnosis present

## 2022-08-09 DIAGNOSIS — K219 Gastro-esophageal reflux disease without esophagitis: Secondary | ICD-10-CM

## 2022-08-09 DIAGNOSIS — R109 Unspecified abdominal pain: Secondary | ICD-10-CM | POA: Diagnosis present

## 2022-08-09 DIAGNOSIS — I2699 Other pulmonary embolism without acute cor pulmonale: Secondary | ICD-10-CM | POA: Diagnosis present

## 2022-08-09 DIAGNOSIS — M898X9 Other specified disorders of bone, unspecified site: Secondary | ICD-10-CM | POA: Diagnosis present

## 2022-08-09 DIAGNOSIS — R1084 Generalized abdominal pain: Secondary | ICD-10-CM

## 2022-08-09 DIAGNOSIS — I10 Essential (primary) hypertension: Secondary | ICD-10-CM | POA: Diagnosis present

## 2022-08-09 DIAGNOSIS — Z91158 Patient's noncompliance with renal dialysis for other reason: Secondary | ICD-10-CM

## 2022-08-09 DIAGNOSIS — Z992 Dependence on renal dialysis: Secondary | ICD-10-CM | POA: Diagnosis not present

## 2022-08-09 DIAGNOSIS — I160 Hypertensive urgency: Secondary | ICD-10-CM | POA: Diagnosis present

## 2022-08-09 LAB — CBC WITH DIFFERENTIAL/PLATELET
Abs Immature Granulocytes: 0.04 10*3/uL (ref 0.00–0.07)
Basophils Absolute: 0 10*3/uL (ref 0.0–0.1)
Basophils Relative: 1 %
Eosinophils Absolute: 0.2 10*3/uL (ref 0.0–0.5)
Eosinophils Relative: 4 %
HCT: 38 % (ref 36.0–46.0)
Hemoglobin: 11.9 g/dL — ABNORMAL LOW (ref 12.0–15.0)
Immature Granulocytes: 1 %
Lymphocytes Relative: 20 %
Lymphs Abs: 1.1 10*3/uL (ref 0.7–4.0)
MCH: 28.2 pg (ref 26.0–34.0)
MCHC: 31.3 g/dL (ref 30.0–36.0)
MCV: 90 fL (ref 80.0–100.0)
Monocytes Absolute: 0.6 10*3/uL (ref 0.1–1.0)
Monocytes Relative: 10 %
Neutro Abs: 3.5 10*3/uL (ref 1.7–7.7)
Neutrophils Relative %: 64 %
Platelets: 213 10*3/uL (ref 150–400)
RBC: 4.22 MIL/uL (ref 3.87–5.11)
RDW: 21.4 % — ABNORMAL HIGH (ref 11.5–15.5)
WBC: 5.4 10*3/uL (ref 4.0–10.5)
nRBC: 0 % (ref 0.0–0.2)

## 2022-08-09 LAB — COMPREHENSIVE METABOLIC PANEL
ALT: 34 U/L (ref 0–44)
AST: 35 U/L (ref 15–41)
Albumin: 3.5 g/dL (ref 3.5–5.0)
Alkaline Phosphatase: 210 U/L — ABNORMAL HIGH (ref 38–126)
Anion gap: 12 (ref 5–15)
BUN: 99 mg/dL — ABNORMAL HIGH (ref 6–20)
CO2: 22 mmol/L (ref 22–32)
Calcium: 8.2 mg/dL — ABNORMAL LOW (ref 8.9–10.3)
Chloride: 106 mmol/L (ref 98–111)
Creatinine, Ser: 20.14 mg/dL — ABNORMAL HIGH (ref 0.44–1.00)
GFR, Estimated: 2 mL/min — ABNORMAL LOW (ref >60)
Glucose, Bld: 95 mg/dL (ref 70–99)
Potassium: 6.1 mmol/L — ABNORMAL HIGH (ref 3.5–5.1)
Sodium: 140 mmol/L (ref 135–145)
Total Bilirubin: 0.8 mg/dL (ref 0.3–1.2)
Total Protein: 7.2 g/dL (ref 6.5–8.1)

## 2022-08-09 LAB — LIPASE, BLOOD: Lipase: 52 U/L — ABNORMAL HIGH (ref 11–51)

## 2022-08-09 MED ORDER — LOSARTAN POTASSIUM 50 MG PO TABS
100.0000 mg | ORAL_TABLET | Freq: Every day | ORAL | Status: DC
  Administered 2022-08-09 – 2022-08-11 (×3): 100 mg via ORAL
  Filled 2022-08-09 (×3): qty 2

## 2022-08-09 MED ORDER — DIPHENHYDRAMINE HCL 25 MG PO CAPS
25.0000 mg | ORAL_CAPSULE | Freq: Once | ORAL | Status: AC
  Administered 2022-08-09: 25 mg via ORAL
  Filled 2022-08-09: qty 1

## 2022-08-09 MED ORDER — CALCIUM ACETATE (PHOS BINDER) 667 MG PO CAPS
1334.0000 mg | ORAL_CAPSULE | Freq: Three times a day (TID) | ORAL | Status: DC
  Administered 2022-08-10 – 2022-08-12 (×8): 1334 mg via ORAL
  Filled 2022-08-09 (×9): qty 2

## 2022-08-09 MED ORDER — MORPHINE SULFATE (PF) 4 MG/ML IV SOLN
4.0000 mg | Freq: Once | INTRAVENOUS | Status: AC
  Administered 2022-08-09: 4 mg via INTRAVENOUS
  Filled 2022-08-09: qty 1

## 2022-08-09 MED ORDER — IOHEXOL 300 MG/ML  SOLN
100.0000 mL | Freq: Once | INTRAMUSCULAR | Status: AC | PRN
  Administered 2022-08-09: 100 mL via INTRAVENOUS

## 2022-08-09 MED ORDER — HYDRALAZINE HCL 20 MG/ML IJ SOLN
INTRAMUSCULAR | Status: AC
  Administered 2022-08-09: 10 mg via INTRAVENOUS
  Filled 2022-08-09: qty 1

## 2022-08-09 MED ORDER — APIXABAN 5 MG PO TABS
5.0000 mg | ORAL_TABLET | Freq: Two times a day (BID) | ORAL | Status: DC
  Administered 2022-08-09 – 2022-08-12 (×6): 5 mg via ORAL
  Filled 2022-08-09 (×6): qty 1

## 2022-08-09 MED ORDER — ONDANSETRON HCL 4 MG PO TABS: 4.0000 mg | ORAL_TABLET | Freq: Four times a day (QID) | ORAL | Status: DC | PRN

## 2022-08-09 MED ORDER — SODIUM BICARBONATE 8.4 % IV SOLN: 50.0000 meq | Freq: Once | INTRAVENOUS | Status: DC

## 2022-08-09 MED ORDER — HYDRALAZINE HCL 20 MG/ML IJ SOLN: 10.0000 mg | Freq: Once | INTRAMUSCULAR | Status: AC

## 2022-08-09 MED ORDER — CALCIUM GLUCONATE-NACL 1-0.675 GM/50ML-% IV SOLN
1.0000 g | Freq: Once | INTRAVENOUS | Status: DC
  Filled 2022-08-09: qty 50

## 2022-08-09 MED ORDER — HYDRALAZINE HCL 20 MG/ML IJ SOLN
20.0000 mg | Freq: Once | INTRAMUSCULAR | Status: AC
Start: 2022-08-09 — End: 2022-08-09
  Administered 2022-08-09: 20 mg via INTRAVENOUS
  Filled 2022-08-09: qty 1

## 2022-08-09 MED ORDER — INFLUENZA VAC SPLIT QUAD 0.5 ML IM SUSY
0.5000 mL | PREFILLED_SYRINGE | INTRAMUSCULAR | Status: AC
Start: 2022-08-10 — End: 2022-08-10
  Administered 2022-08-10: 0.5 mL via INTRAMUSCULAR
  Filled 2022-08-09: qty 0.5

## 2022-08-09 MED ORDER — ONDANSETRON HCL 4 MG/2ML IJ SOLN
4.0000 mg | Freq: Once | INTRAMUSCULAR | Status: AC
Start: 2022-08-09 — End: 2022-08-09
  Administered 2022-08-09: 4 mg via INTRAVENOUS
  Filled 2022-08-09: qty 2

## 2022-08-09 MED ORDER — ONDANSETRON HCL 4 MG/2ML IJ SOLN
4.0000 mg | Freq: Four times a day (QID) | INTRAMUSCULAR | Status: DC | PRN
  Administered 2022-08-09 – 2022-08-12 (×5): 4 mg via INTRAVENOUS
  Filled 2022-08-09 (×5): qty 2

## 2022-08-09 MED ORDER — ALTEPLASE 2 MG IJ SOLR
INTRAMUSCULAR | Status: AC
  Administered 2022-08-09: 4 mg
  Filled 2022-08-09: qty 4

## 2022-08-09 MED ORDER — SODIUM CHLORIDE 0.9 % IV SOLN
2.0000 g | INTRAVENOUS | Status: DC
  Administered 2022-08-09 – 2022-08-11 (×3): 2 g via INTRAVENOUS
  Filled 2022-08-09 (×3): qty 20

## 2022-08-09 MED ORDER — NICARDIPINE HCL IN NACL 20-0.86 MG/200ML-% IV SOLN
3.0000 mg/h | INTRAVENOUS | Status: DC
  Administered 2022-08-09: 5 mg/h via INTRAVENOUS
  Administered 2022-08-09: 10 mg/h via INTRAVENOUS
  Administered 2022-08-10 (×3): 7.5 mg/h via INTRAVENOUS
  Administered 2022-08-10: 3 mg/h via INTRAVENOUS
  Administered 2022-08-10 (×2): 7.5 mg/h via INTRAVENOUS
  Administered 2022-08-10: 10 mg/h via INTRAVENOUS
  Administered 2022-08-11: 5 mg/h via INTRAVENOUS
  Administered 2022-08-12: 3 mg/h via INTRAVENOUS
  Filled 2022-08-09 (×3): qty 200
  Filled 2022-08-09: qty 400
  Filled 2022-08-09 (×6): qty 200

## 2022-08-09 MED ORDER — DIPHENHYDRAMINE HCL 25 MG PO CAPS: 25.0000 mg | ORAL_CAPSULE | Freq: Once | ORAL | Status: AC

## 2022-08-09 MED ORDER — DIPHENHYDRAMINE HCL 25 MG PO CAPS
ORAL_CAPSULE | ORAL | Status: AC
  Administered 2022-08-09: 25 mg via ORAL
  Filled 2022-08-09: qty 1

## 2022-08-09 MED ORDER — ALTEPLASE 2 MG IJ SOLR: 2.0000 mg | Freq: Once | INTRAMUSCULAR | Status: AC | PRN

## 2022-08-09 MED ORDER — HEPARIN SODIUM (PORCINE) 1000 UNIT/ML DIALYSIS
1000.0000 [IU] | INTRAMUSCULAR | Status: DC | PRN
  Administered 2022-08-11: 3200 [IU]

## 2022-08-09 MED ORDER — CHLORHEXIDINE GLUCONATE CLOTH 2 % EX PADS
6.0000 | MEDICATED_PAD | Freq: Every day | CUTANEOUS | Status: DC
  Administered 2022-08-09: 6 via TOPICAL

## 2022-08-09 MED ORDER — SODIUM ZIRCONIUM CYCLOSILICATE 10 G PO PACK
10.0000 g | PACK | Freq: Once | ORAL | Status: DC
Start: 2022-08-09 — End: 2022-08-12

## 2022-08-09 MED ORDER — HYDROMORPHONE HCL 1 MG/ML IJ SOLN
0.5000 mg | Freq: Once | INTRAMUSCULAR | Status: AC
  Administered 2022-08-09: 0.5 mg via INTRAVENOUS
  Filled 2022-08-09: qty 0.5

## 2022-08-09 MED ORDER — CARVEDILOL 12.5 MG PO TABS
25.0000 mg | ORAL_TABLET | Freq: Two times a day (BID) | ORAL | Status: DC
  Administered 2022-08-10 – 2022-08-12 (×6): 25 mg via ORAL
  Filled 2022-08-09 (×6): qty 2

## 2022-08-09 MED ORDER — HYDROMORPHONE HCL 1 MG/ML IJ SOLN
0.5000 mg | INTRAMUSCULAR | Status: DC | PRN
  Administered 2022-08-09 – 2022-08-12 (×19): 0.5 mg via INTRAVENOUS
  Filled 2022-08-09 (×19): qty 0.5

## 2022-08-09 NOTE — Procedures (Signed)
   EMERGENT HEMODIALYSIS TREATMENT NOTE:   Received pt from ED via stretcher before calcium gluconate infusion had started - ok to discontinue, per Dr. Royce Macadamia.  Hypertensive, c/o intermittent abdominal pain 10/10 "feels like contractions" - GI consult pending.    HD catheter without dressing on admission.  Discussed risks of CRBSIs.  Entry site care done + biopatch and transparent dressing.  Art port would not aspirate - lines reversed for HD - locked with cathflo activase post-dialysis, per Dr. Royce Macadamia.  3.5 hour heparin-free session completed. Goal met: 3.7 liters removed.  Persistent hypertension (DBP>100) d/w Dr. Nehemiah Settle.  Hydralazine 45m IVP given at [facility] no effect.  Pt to transfer to stepdown unit for antihypertensive infusion.    Post HD v/s 191/122, map 144, p70, r20, spO2 96% on RA.  Rocephin which was scheduled for 1830 is still due.  Hand-off given to RMorton Peters RN.   ARockwell Alexandria RN

## 2022-08-09 NOTE — ED Provider Triage Note (Signed)
Emergency Medicine Provider Triage Evaluation Note  Cathy [name] , a 41 y.o. female  was evaluated in triage.  Pt complains of generalized abdominal pain, nausea and vomiting x2 days.  Denies fevers, diarrhea.  Patient is a dialysis patient who missed Tuesday and Thursday this week secondary to childcare issues, stating she has a 39-year-old child that she had not been able to find childcare for in order to get her dialysis.  She does make urine.  Review of Systems  Positive: Nausea vomiting and abdominal pain Negative: Fevers, dysuria, back pain  Physical Exam  BP (!) 196/120 (BP Location: Right Arm)   Pulse 64   Temp 98.5 F (36.9 C) (Oral)   Resp 18   Ht 5\' 7"  (1.702 m)   Wt 59 kg   SpO2 98%   BMI 20.36 kg/m  Gen:   Awake, no distress   Resp:  Normal effort  MSK:   Moves extremities without difficulty  Other:    Medical Decision Making  Medically screening exam initiated at 11:54 AM.  Appropriate orders placed.  Cathy [name] was informed that the remainder of the evaluation will be completed by another provider, this initial triage assessment does not replace that evaluation, and the importance of remaining in the ED until their evaluation is complete.  Patient has been medically screened, lab testing has been ordered.  Pending ED room.   Cathy Jefferson, PA-C 08/09/22 1156

## 2022-08-09 NOTE — ED Triage Notes (Signed)
Pt to the ED with complaints of abdominal pain for the past two days. Pt states she has been unable to eat with nausea and vomiting.   Pt is a dialysis patient who last had dialysis on Saturday of last week and has missed three treatment. The dialysis facility requires blood to be drawn and medical clearance in order for her to return for treatment.

## 2022-08-09 NOTE — H&P (Signed)
History and Physical    Patient: Cathy [name] OQH:[id number] DOB: 1981/05/08 DOA: 08/09/2022 DOS: the patient was seen and examined on 08/09/2022 PCP: Pcp, No  Patient coming from: Home  Chief Complaint:  Chief Complaint  Patient presents with   Abdominal Pain   HPI: Cathy [name] is a 41 y.o. female with medical history significant of end-stage renal disease on dialysis, lupus with lupus nephritis.  Patient presents with generalized stabbing periumbilical abdominal pain that started 2 days ago with nausea and vomiting and distention.  She has not been able to tolerate oral food or liquid for 24 hours.  She denies fevers, chills, nausea, vomiting.  She has not been able to tolerate any of her oral medications.   She does have difficulty with childcare and has missed the last 3 episodes of dialysis.  Patient seen during dialysis - starting to feel a little improved.  Review of Systems: As mentioned in the history of present illness. All other systems reviewed and are negative. Past Medical History:  Diagnosis Date   Anasarca associated with disorder of kidney 06/08/2021   Anxiety    Asthma    Bipolar depression ([place])    Depression    Dyspnea    ESRD (end stage renal disease) on dialysis ([place]) 10/2020   GERD (gastroesophageal reflux disease)    Gonorrhea    Lupus (HCC)    Pelvic inflammatory disease (PID)    Pleural effusion 06/08/2021   Pulmonary embolism ([place]) 01/31/2022   Renal hypertension    Trichomonas infection    Past Surgical History:  Procedure Laterality Date   AV FISTULA PLACEMENT Left 06/10/2022   Procedure: LEFT ARM ARTERIOVENOUS (AV) FISTULA CREATION;  Surgeon: [name], MD;  Location: [facility];  Service: Vascular;  Laterality: Left;   IR FLUORO GUIDE CV LINE RIGHT  11/30/2020   IR FLUORO GUIDE CV LINE RIGHT  06/13/2021   IR FLUORO GUIDE CV LINE RIGHT  02/01/2022   IR REMOVAL TUN CV CATH W/O FL  06/11/2021   IR REMOVAL TUN CV CATH W/O FL  01/29/2022   IR  US GUIDE VASC ACCESS RIGHT  11/30/2020   IR US GUIDE VASC ACCESS RIGHT  06/13/2021   IR US GUIDE VASC ACCESS RIGHT  02/03/2022   RENAL BIOPSY     TEE WITHOUT CARDIOVERSION N/A 06/13/2021   Procedure: TRANSESOPHAGEAL ECHOCARDIOGRAM (TEE);  Surgeon: Josue Hector, MD;  Location: [facility] ENDOSCOPY;  Service: Cardiovascular;  Laterality: N/A;   TEE WITHOUT CARDIOVERSION N/A 02/03/2022   Procedure: TRANSESOPHAGEAL ECHOCARDIOGRAM (TEE);  Surgeon: Berniece Salines, DO;  Location: [facility];  Service: Cardiovascular;  Laterality: N/A;   TUBAL LIGATION N/A 02/03/2019   Procedure: POST PARTUM TUBAL LIGATION;  Surgeon: Aletha Halim, MD;  Location: [facility]S;  Service: Gynecology;  Laterality: N/A;   Social History:  reports that she has been smoking cigarettes. She has never used smokeless tobacco. She reports that she does not currently use alcohol. She reports that she does not currently use drugs after having used the following drugs: Marijuana.  Allergies  Allergen Reactions   Oxycodone Itching    Family History  Problem Relation Age of Onset   Diabetes Maternal Grandmother    Hypertension Maternal Grandmother    Diabetes Maternal Grandfather    Hypertension Maternal Grandfather    Diabetes Paternal Grandmother    Hypertension Paternal Grandmother    Diabetes Paternal Grandfather    Hypertension Paternal Grandfather     Prior to Admission  medications   Medication Sig Start Date End Date Taking? Authorizing Provider  acetaminophen (TYLENOL) 500 MG tablet Take 1,000 mg by mouth every 6 (six) hours as needed for moderate pain.    [provider]  albuterol (VENTOLIN HFA) 108 (90 Base) MCG/ACT inhaler Inhale 2 puffs into the lungs every 6 (six) hours as needed for wheezing or shortness of breath. 12/17/21 12/17/22  Rai, Ripudeep K, MD  amLODipine (NORVASC) 10 MG tablet Take 1 tablet (10 mg total) by mouth daily. 03/05/22   Orson Eva, MD  apixaban (ELIQUIS) 5 MG TABS tablet Take 1 tablet (5 mg  total) by mouth 2 (two) times daily. Patient taking differently: Take 5 mg by mouth daily. 03/05/22   Orson Eva, MD  Blood Pressure Monitoring (OMRON 3 SERIES BP MONITOR) DEVI Use as directed 2 times a day 01/09/22   Shelly Coss, MD  calcium acetate (PHOSLO) 667 MG capsule Take 2 capsules (1,334 mg total) by mouth 3 (three) times daily with meals. 03/05/22   Orson Eva, MD  carvedilol (COREG) 25 MG tablet Take 1 tablet (25 mg total) by mouth 2 (two) times daily with a meal. 03/05/22   Tat, Shanon Brow, MD  cloNIDine (CATAPRES) 0.2 MG tablet Take 1 tablet (0.2 mg total) by mouth every 8 (eight) hours. 03/05/22   Orson Eva, MD  Doxercalciferol (HECTOROL IV) Doxercalciferol (Hectorol) 03/06/22 03/05/23  [provider]  guaiFENesin (MUCINEX) 600 MG 12 hr tablet Take 600 mg by mouth 2 (two) times daily as needed for cough.    [provider]  hydrALAZINE (APRESOLINE) 100 MG tablet Take 1 tablet (100 mg total) by mouth 3 (three) times daily. 03/05/22   Orson Eva, MD  ipratropium (ATROVENT HFA) 17 MCG/ACT inhaler Inhale 2 puffs into the lungs every 6 (six) hours as needed for wheezing. 12/17/21 12/17/22  Rai, Vernelle Emerald, MD  losartan (COZAAR) 100 MG tablet Take 1 tablet (100 mg total) by mouth at bedtime. 03/05/22   Orson Eva, MD  traMADol (ULTRAM) 50 MG tablet Take 1 tablet (50 mg total) by mouth every 6 (six) hours as needed. Patient not taking: Reported on 07/02/2022 06/10/22   [name], MD  fenofibrate 160 MG tablet Take 1 tablet (160 mg total) by mouth daily. 12/31/20 01/20/21  Elmarie Shiley, MD    Physical Exam: Vitals:   08/09/22 1730 08/09/22 1800 08/09/22 1830 08/09/22 1900  BP: (!) 194/106 (!) 199/109 (!) 201/104 (!) 210/114  Pulse:      Resp: 18 14 15 16   Temp:      TempSrc:      SpO2:      Weight:      Height:       General: Young female. Awake and alert and oriented x3. No acute cardiopulmonary distress.  HEENT: Normocephalic atraumatic.  Right and left ears normal in  appearance.  Pupils equal, round, reactive to light. Extraocular muscles are intact. Sclerae anicteric and noninjected.  Moist mucosal membranes. No mucosal lesions.  Neck: Neck supple without lymphadenopathy. No carotid bruits. No masses palpated.  Cardiovascular: Regular rate with normal S1-S2 sounds. No murmurs, rubs, gallops auscultated. No JVD.  Respiratory: Good respiratory effort with no wheezes, rales, rhonchi. Lungs clear to auscultation bilaterally.  No accessory muscle use. Abdomen: tenderness to palpation with guarding. No rebound tenderness. Mild distention. Active bowel sounds. No masses or hepatosplenomegaly  Skin: No rashes, lesions, or ulcerations.  Dry, warm to touch. 2+ dorsalis pedis and radial pulses. Musculoskeletal: No calf  or leg pain. All major joints not erythematous nontender.  No upper or lower joint deformation.  Good ROM.  No contractures  Psychiatric: Intact judgment and insight. Pleasant and cooperative. Neurologic: No focal neurological deficits. Strength is 5/5 and symmetric in upper and lower extremities.  Cranial nerves II through XII are grossly intact.  Data Reviewed: Results for orders placed or performed during the hospital encounter of 08/09/22 (from the past 24 hour(s))  CBC with Differential     Status: Abnormal   Collection Time: 08/09/22 12:20 PM  Result Value Ref Range   WBC 5.4 4.0 - 10.5 K/uL   RBC 4.22 3.87 - 5.11 MIL/uL   Hemoglobin 11.9 (L) 12.0 - 15.0 g/dL   HCT 38.0 36.0 - 46.0 %   MCV 90.0 80.0 - 100.0 fL   MCH 28.2 26.0 - 34.0 pg   MCHC 31.3 30.0 - 36.0 g/dL   RDW 21.4 (H) 11.5 - 15.5 %   Platelets 213 150 - 400 K/uL   nRBC 0.0 0.0 - 0.2 %   Neutrophils Relative % 64 %   Neutro Abs 3.5 1.7 - 7.7 K/uL   Lymphocytes Relative 20 %   Lymphs Abs 1.1 0.7 - 4.0 K/uL   Monocytes Relative 10 %   Monocytes Absolute 0.6 0.1 - 1.0 K/uL   Eosinophils Relative 4 %   Eosinophils Absolute 0.2 0.0 - 0.5 K/uL   Basophils Relative 1 %   Basophils  Absolute 0.0 0.0 - 0.1 K/uL   Immature Granulocytes 1 %   Abs Immature Granulocytes 0.04 0.00 - 0.07 K/uL  Comprehensive metabolic panel     Status: Abnormal   Collection Time: 08/09/22 12:20 PM  Result Value Ref Range   Sodium 140 135 - 145 mmol/L   Potassium 6.1 (H) 3.5 - 5.1 mmol/L   Chloride 106 98 - 111 mmol/L   CO2 22 22 - 32 mmol/L   Glucose, Bld 95 70 - 99 mg/dL   BUN 99 (H) 6 - 20 mg/dL   Creatinine, Ser 20.14 (H) 0.44 - 1.00 mg/dL   Calcium 8.2 (L) 8.9 - 10.3 mg/dL   Total Protein 7.2 6.5 - 8.1 g/dL   Albumin 3.5 3.5 - 5.0 g/dL   AST 35 15 - 41 U/L   ALT 34 0 - 44 U/L   Alkaline Phosphatase 210 (H) 38 - 126 U/L   Total Bilirubin 0.8 0.3 - 1.2 mg/dL   GFR, Estimated 2 (L) >60 mL/min   Anion gap 12 5 - 15  Lipase, blood     Status: Abnormal   Collection Time: 08/09/22 12:20 PM  Result Value Ref Range   Lipase 52 (H) 11 - 51 U/L   CT ABDOMEN PELVIS W CONTRAST  Result Date: 08/09/2022 CLINICAL DATA:  Generalized abdominal pain and nausea and vomiting for 2 days. On dialysis. EXAM: CT ABDOMEN AND PELVIS WITH CONTRAST TECHNIQUE: Multidetector CT imaging of the abdomen and pelvis was performed using the standard protocol following bolus administration of intravenous contrast. RADIATION DOSE REDUCTION: This exam was performed according to the departmental dose-optimization program which includes automated exposure control, adjustment of the mA and/or kV according to patient size and/or use of iterative reconstruction technique. CONTRAST:  154mL OMNIPAQUE IOHEXOL 300 MG/ML  SOLN COMPARISON:  Noncontrast CT on 03/17/2022 FINDINGS: Lower Chest: Stable marked cardiomegaly. Stable right basilar scarring. Hepatobiliary: Hypertrophy of caudate and left hepatic lobes is suspicious for cirrhosis. Periportal edema and enhancement is seen throughout the liver, which is nonspecific and  may be due to hepatitis or hepatic congestion syndrome. Multiple tiny sub-centimeter hypervascular foci are also  seen throughout the liver, which may represent cirrhotic nodules or hepatocellular carcinoma. Diffuse gallbladder wall thickening is likely secondary to hepatocellular disease. No evidence of biliary ductal dilatation. Pancreas:  No mass or inflammatory changes. Spleen: Within normal limits in size and appearance. Adrenals/Urinary Tract: No suspicious masses identified. No evidence of ureteral calculi or hydronephrosis. Stomach/Bowel: No evidence of obstruction, inflammatory process or abnormal fluid collections. Vascular/Lymphatic: Shotty retroperitoneal lymph nodes in the left paraaortic region show no significant change. No acute vascular findings. Reproductive:  No mass or other significant abnormality. Other: Diffuse mesenteric edema and mild-to-moderate ascites is noted. Musculoskeletal:  No suspicious bone lesions identified. IMPRESSION: Diffuse periportal edema and enhancement throughout the liver is nonspecific, and may be due to hepatitis or hepatic congestion syndrome. Probable hepatic cirrhosis. Multiple tiny sub-centimeter hypervascular foci throughout the liver, which may represent cirrhotic nodules or hepatocellular carcinoma. Recommend abdomen MRI without and with contrast for further characterization. Mild-to-moderate ascites and diffuse mesenteric edema. Stable marked cardiomegaly. Electronically Signed   By: Marlaine Hind M.D.   On: 08/09/2022 15:08     Assessment and Plan: No notes have been filed under this hospital service. Service: Hospitalist  Active Problems:   Hyperkalemia   Lupus nephritis (HCC)   Benign essential HTN   HFrEF (heart failure with reduced ejection fraction) (HCC)   Hypertensive urgency   History of pulmonary embolus   Ascites   Abdominal pain  Abdominal pain ED PE consulted GI who has concerns about spontaneous bacterial peritonitis.  We will start the patient on Rocephin and arrange for paracentesis We will treat the patient's pain Zofran for nausea and  vomiting We will keep the patient on clear liquids for now and advance as tolerated. End-stage renal disease on dialysis secondary to lupus nephritis Dialysis tonight.  Patient will likely need dialysis on Monday. Hyperkalemia Repeat BMP tonight and tomorrow morning Patient has received Lokelma, bicarb Ascites Likely secondary to fluid overload. Hypertension with hypertensive urgency We will restart the patient's medications. Hydralazine 10 mg now IV 1 consideration should be to change the patient to clonidine patch as this may improve the patient's ability to take the medication should she begin to have nausea and vomiting. At the present, the patient thinks that she will be able to keep her medications down, so we will hold on making the change for the present time. History of pulmonary embolism   Advance Care Planning:   Code Status: Prior full code  Consults: Nephrology, gastroenterology  Family Communication: None  Severity of Illness: The appropriate patient status for this patient is INPATIENT. Inpatient status is judged to be reasonable and necessary in order to provide the required intensity of service to ensure the patient's safety. The patient's presenting symptoms, physical exam findings, and initial radiographic and laboratory data in the context of their chronic comorbidities is felt to place them at high risk for further clinical deterioration. Furthermore, it is not anticipated that the patient will be medically stable for discharge from the hospital within 2 midnights of admission.   * I certify that at the point of admission it is my clinical judgment that the patient will require inpatient hospital care spanning beyond 2 midnights from the point of admission due to high intensity of service, high risk for further deterioration and high frequency of surveillance required.*  Author: Truett Mainland, DO 08/09/2022 7:11 PM  For on call review  [email or web address]

## 2022-08-09 NOTE — ED Provider Notes (Signed)
[facility] EMERGENCY DEPARTMENT Provider Note   CSN: [id number] Arrival date & time: 08/09/22  1036     History  Chief Complaint  Patient presents with   Abdominal Pain    Cathy [name] is a 41 y.o. female with a history significant for end-stage renal disease on dialysis, lupus induced, Tuesday Thursday Saturday schedule, presenting for evaluation of generalized abdominal pain which started 2 days ago in association with nausea and vomiting.  She also states that she has a to her left to dialysis sessions, Tuesday and Thursday secondary to 2 childcare issues, her older children are in school but that lives her with a 43-year-old at home and she has not been able to find childcare for him so she can get her treatments.  She describes sharp stabbing pain in her periumbilical region, also endorses abdominal distention and has been unable to tolerate p.o. intake for the past 24 hours.  She has had no fevers or chills.  She presents with a significant hypertension, she has been unable to tolerate any of her p.o. medications including her blood pressure medicines.  She does make urine, denies dysuria.  She is not currently taking medicines for her lupus.   The history is provided by the patient.       Home Medications Prior to Admission medications   Medication Sig Start Date End Date Taking? Authorizing Provider  acetaminophen (TYLENOL) 500 MG tablet Take 1,000 mg by mouth every 6 (six) hours as needed for moderate pain.    [provider]  albuterol (VENTOLIN HFA) 108 (90 Base) MCG/ACT inhaler Inhale 2 puffs into the lungs every 6 (six) hours as needed for wheezing or shortness of breath. 12/17/21 12/17/22  Rai, Ripudeep K, MD  amLODipine (NORVASC) 10 MG tablet Take 1 tablet (10 mg total) by mouth daily. 03/05/22   Orson Eva, MD  apixaban (ELIQUIS) 5 MG TABS tablet Take 1 tablet (5 mg total) by mouth 2 (two) times daily. Patient taking differently: Take 5 mg by mouth daily. 03/05/22    Orson Eva, MD  Blood Pressure Monitoring (OMRON 3 SERIES BP MONITOR) DEVI Use as directed 2 times a day 01/09/22   Shelly Coss, MD  calcium acetate (PHOSLO) 667 MG capsule Take 2 capsules (1,334 mg total) by mouth 3 (three) times daily with meals. 03/05/22   Orson Eva, MD  carvedilol (COREG) 25 MG tablet Take 1 tablet (25 mg total) by mouth 2 (two) times daily with a meal. 03/05/22   Tat, Shanon Brow, MD  cloNIDine (CATAPRES) 0.2 MG tablet Take 1 tablet (0.2 mg total) by mouth every 8 (eight) hours. 03/05/22   Orson Eva, MD  Doxercalciferol (HECTOROL IV) Doxercalciferol (Hectorol) 03/06/22 03/05/23  [provider]  guaiFENesin (MUCINEX) 600 MG 12 hr tablet Take 600 mg by mouth 2 (two) times daily as needed for cough.    [provider]  hydrALAZINE (APRESOLINE) 100 MG tablet Take 1 tablet (100 mg total) by mouth 3 (three) times daily. 03/05/22   Orson Eva, MD  ipratropium (ATROVENT HFA) 17 MCG/ACT inhaler Inhale 2 puffs into the lungs every 6 (six) hours as needed for wheezing. 12/17/21 12/17/22  Rai, Vernelle Emerald, MD  losartan (COZAAR) 100 MG tablet Take 1 tablet (100 mg total) by mouth at bedtime. 03/05/22   Orson Eva, MD  traMADol (ULTRAM) 50 MG tablet Take 1 tablet (50 mg total) by mouth every 6 (six) hours as needed. Patient not taking: Reported on 07/02/2022 06/10/22   Curt Jews  F, MD  fenofibrate 160 MG tablet Take 1 tablet (160 mg total) by mouth daily. 12/31/20 01/20/21  Regalado, Cassie Freer, MD      Allergies    Oxycodone    Review of Systems   Review of Systems  Constitutional:  Negative for chills and fever.  HENT:  Negative for congestion and sore throat.   Eyes: Negative.   Respiratory:  Negative for chest tightness and shortness of breath.   Cardiovascular:  Negative for chest pain.  Gastrointestinal:  Positive for abdominal pain, nausea and vomiting. Negative for constipation and diarrhea.  Genitourinary: Negative.  Negative for dysuria.  Musculoskeletal:  Negative for  arthralgias, joint swelling and neck pain.  Skin: Negative.  Negative for rash and wound.  Neurological:  Negative for dizziness, weakness, light-headedness, numbness and headaches.  Psychiatric/Behavioral: Negative.    All other systems reviewed and are negative.   Physical Exam Updated Vital Signs BP (!) 201/104   Pulse 68   Temp 98.4 F (36.9 C) (Oral)   Resp 15   Ht 5' 7"  (1.702 m)   Wt 59 kg   SpO2 96%   BMI 20.36 kg/m  Physical Exam Vitals and nursing note reviewed.  Constitutional:      Appearance: She is well-developed. She is ill-appearing.     Comments: tearful  HENT:     Head: Normocephalic and atraumatic.  Eyes:     Conjunctiva/sclera: Conjunctivae normal.  Cardiovascular:     Rate and Rhythm: Normal rate and regular rhythm.     Heart sounds: Normal heart sounds.  Pulmonary:     Effort: Pulmonary effort is normal.     Breath sounds: Normal breath sounds. No wheezing.  Abdominal:     General: Abdomen is protuberant. Bowel sounds are normal. There is distension.     Palpations: Abdomen is soft.     Tenderness: There is generalized abdominal tenderness. There is guarding.     Comments: Soft abdominal distention.  Guarding periumbilical. Deep palpation not tolerated by pt.   Musculoskeletal:        General: Normal range of motion.     Cervical back: Normal range of motion.  Skin:    General: Skin is warm and dry.  Neurological:     Mental Status: She is alert.     ED Results / Procedures / Treatments   Labs (all labs ordered are listed, but only abnormal results are displayed) Labs Reviewed  CBC WITH DIFFERENTIAL/PLATELET - Abnormal; Notable for the following components:      Result Value   Hemoglobin 11.9 (*)    RDW 21.4 (*)    All other components within normal limits  COMPREHENSIVE METABOLIC PANEL - Abnormal; Notable for the following components:   Potassium 6.1 (*)    BUN 99 (*)    Creatinine, Ser 20.14 (*)    Calcium 8.2 (*)    Alkaline  Phosphatase 210 (*)    GFR, Estimated 2 (*)    All other components within normal limits  LIPASE, BLOOD - Abnormal; Notable for the following components:   Lipase 52 (*)    All other components within normal limits  URINALYSIS, ROUTINE W REFLEX MICROSCOPIC  HEPATITIS B SURFACE ANTIGEN  HEPATITIS B SURFACE ANTIBODY,QUALITATIVE  HEPATITIS B SURFACE ANTIBODY, QUANTITATIVE  HEPATITIS B CORE ANTIBODY, TOTAL  HEPATITIS C ANTIBODY    EKG EKG Interpretation  Date/Time:  Saturday August 09 2022 13:18:20 EDT Ventricular Rate:  67 PR Interval:  164 QRS Duration: 97 QT Interval:  468 QTC Calculation: 495 R Axis:   79 Text Interpretation: Sinus rhythm Left atrial enlargement Left ventricular hypertrophy Anterolateral Q wave, probably normal for age Confirmed by Milton Ferguson [phone]) on 08/09/2022 6:48:20 PM  Radiology CT ABDOMEN PELVIS W CONTRAST  Result Date: 08/09/2022 CLINICAL DATA:  Generalized abdominal pain and nausea and vomiting for 2 days. On dialysis. EXAM: CT ABDOMEN AND PELVIS WITH CONTRAST TECHNIQUE: Multidetector CT imaging of the abdomen and pelvis was performed using the standard protocol following bolus administration of intravenous contrast. RADIATION DOSE REDUCTION: This exam was performed according to the departmental dose-optimization program which includes automated exposure control, adjustment of the mA and/or kV according to patient size and/or use of iterative reconstruction technique. CONTRAST:  161m OMNIPAQUE IOHEXOL 300 MG/ML  SOLN COMPARISON:  Noncontrast CT on 03/17/2022 FINDINGS: Lower Chest: Stable marked cardiomegaly. Stable right basilar scarring. Hepatobiliary: Hypertrophy of caudate and left hepatic lobes is suspicious for cirrhosis. Periportal edema and enhancement is seen throughout the liver, which is nonspecific and may be due to hepatitis or hepatic congestion syndrome. Multiple tiny sub-centimeter hypervascular foci are also seen throughout the liver, which  may represent cirrhotic nodules or hepatocellular carcinoma. Diffuse gallbladder wall thickening is likely secondary to hepatocellular disease. No evidence of biliary ductal dilatation. Pancreas:  No mass or inflammatory changes. Spleen: Within normal limits in size and appearance. Adrenals/Urinary Tract: No suspicious masses identified. No evidence of ureteral calculi or hydronephrosis. Stomach/Bowel: No evidence of obstruction, inflammatory process or abnormal fluid collections. Vascular/Lymphatic: Shotty retroperitoneal lymph nodes in the left paraaortic region show no significant change. No acute vascular findings. Reproductive:  No mass or other significant abnormality. Other: Diffuse mesenteric edema and mild-to-moderate ascites is noted. Musculoskeletal:  No suspicious bone lesions identified. IMPRESSION: Diffuse periportal edema and enhancement throughout the liver is nonspecific, and may be due to hepatitis or hepatic congestion syndrome. Probable hepatic cirrhosis. Multiple tiny sub-centimeter hypervascular foci throughout the liver, which may represent cirrhotic nodules or hepatocellular carcinoma. Recommend abdomen MRI without and with contrast for further characterization. Mild-to-moderate ascites and diffuse mesenteric edema. Stable marked cardiomegaly. Electronically Signed   By: JMarlaine HindM.D.   On: 08/09/2022 15:08    Procedures Procedures    Medications Ordered in ED Medications  sodium bicarbonate injection 50 mEq (has no administration in time range)  sodium zirconium cyclosilicate (LOKELMA) packet 10 g (has no administration in time range)  Chlorhexidine Gluconate Cloth 2 % PADS 6 each (has no administration in time range)  heparin injection 1,000 Units (has no administration in time range)  alteplase (CATHFLO ACTIVASE) injection 2 mg (has no administration in time range)  ondansetron (ZOFRAN) injection 4 mg (4 mg Intravenous Given 08/09/22 1229)  morphine (PF) 4 MG/ML injection 4  mg (4 mg Intravenous Given 08/09/22 1230)  hydrALAZINE (APRESOLINE) injection 20 mg (20 mg Intravenous Given 08/09/22 1229)  HYDROmorphone (DILAUDID) injection 0.5 mg (0.5 mg Intravenous Given 08/09/22 1526)  iohexol (OMNIPAQUE) 300 MG/ML solution 100 mL (100 mLs Intravenous Contrast Given 08/09/22 1447)  diphenhydrAMINE (BENADRYL) capsule 25 mg (25 mg Oral Given 08/09/22 1545)    ED Course/ Medical Decision Making/ A&P                           Medical Decision Making Patient presenting with 2-day history of severe abdominal pain, nausea and vomiting along with having missed 2 dialysis sessions this week.  She has a significant hyperkalemia at 6.1 with peaked T's on EKG  requiring emergent dialysis today.   Other pertinent labs including an alk phos of 210 which has been chronically elevated, her LFTs are normal, she does have a mild elevation in her lipase at 52.  CT imaging with mild to moderate ascites which has been present in the past, new hepatic findings suggested cirrhosis versus carcinoma.  Amount and/or Complexity of Data Reviewed Labs: ordered.    Details: Per above Radiology: ordered.    Details: Per above  Risk Prescription drug management. Decision regarding hospitalization. Diagnosis or treatment significantly limited by social determinants of health. Risk Details: 2:50 PM Spoke with Dr. Royce Macadamia with nephrology - will arrange emergent dialysis today with plans to dc home from renal standpoint.   Call placed also to Dr. Jenetta Downer to discuss abd pain and new hepatic findings suggesting cirrhosis vs carcinoma. Pt has had ascites prior - admitted 4/23 - ruled out sbp.  Given degree of guarding, Dr. Melida Quitter  concerned for perforation or other surgical problem and also sbp, advised call to gen surgery.  Advised pt will need paracentesis and labs to rule out sbp, but also ascites could be from missed dialysis tx.  She has had multiple tests for acute hepatitis, positive for hep b surface  antibody, but negative for hep b surface antigen, most recently tested 07/01/22.   He will follow pt if she remains at [facility].   Discussed case with Dr. Constance Haw who reviewed ct imaging and labs.  No signs of bowel wall inflammation or free air,  no surgical intervention at this time indicated.   Discussed with Dr. Nehemiah Settle who accepts pt for admission.  She will remain here at [facility].    Advised Dr.Castaneda of pt placement.             Final Clinical Impression(s) / ED Diagnoses Final diagnoses:  Generalized abdominal pain  Other ascites  ESRD on dialysis [facility])  Hyperkalemia    Rx / DC Orders ED Discharge Orders     None         Landis Martins 08/09/22 Marshall, [place] K, DO 08/10/22 [phone]

## 2022-08-09 NOTE — ED Notes (Signed)
Pt has not had dialysis since last sat.

## 2022-08-09 NOTE — Care Management (Signed)
Inquiry about any assistance with child care, patient is unable to get to dialysis due to this. Asked several TOC members, none available. Patient should discuss with her social worker at her center. Messaged provider

## 2022-08-09 NOTE — Progress Notes (Signed)
Patient Name: Cathy [name], female   DOB: Nov 24, 1981, 41 y.o.  MRN: [id number]  Despite several doses of IV hydralazine, her blood patient remains elevated. Will admit to stepdown and start patient on Cardene Drip.  Truett Mainland, DO 08/09/2022.

## 2022-08-09 NOTE — ED Notes (Signed)
Pt remains in dialysis at this time- spoke with Otila Kluver, charge nurse on 300 to request notification when bed is assigned and ready so I can inform dialysis nurse of admission room number and relay to dialysis nurse so she can take pt directly to 300 when dialysis is complete.

## 2022-08-09 NOTE — ED Notes (Addendum)
Sent calcium to dialysis.  Pt going to dialysis

## 2022-08-09 NOTE — Plan of Care (Signed)

## 2022-08-10 DIAGNOSIS — R1084 Generalized abdominal pain: Secondary | ICD-10-CM | POA: Diagnosis not present

## 2022-08-10 LAB — COMPREHENSIVE METABOLIC PANEL
ALT: 32 U/L (ref 0–44)
AST: 27 U/L (ref 15–41)
Albumin: 3.4 g/dL — ABNORMAL LOW (ref 3.5–5.0)
Alkaline Phosphatase: 208 U/L — ABNORMAL HIGH (ref 38–126)
Anion gap: 13 (ref 5–15)
BUN: 42 mg/dL — ABNORMAL HIGH (ref 6–20)
CO2: 25 mmol/L (ref 22–32)
Calcium: 7.8 mg/dL — ABNORMAL LOW (ref 8.9–10.3)
Chloride: 100 mmol/L (ref 98–111)
Creatinine, Ser: 11.66 mg/dL — ABNORMAL HIGH (ref 0.44–1.00)
GFR, Estimated: 4 mL/min — ABNORMAL LOW (ref >60)
Glucose, Bld: 79 mg/dL (ref 70–99)
Potassium: 4.4 mmol/L (ref 3.5–5.1)
Sodium: 138 mmol/L (ref 135–145)
Total Bilirubin: 0.8 mg/dL (ref 0.3–1.2)
Total Protein: 7.3 g/dL (ref 6.5–8.1)

## 2022-08-10 LAB — URINALYSIS, ROUTINE W REFLEX MICROSCOPIC
Bilirubin Urine: NEGATIVE
Glucose, UA: NEGATIVE mg/dL
Hgb urine dipstick: NEGATIVE
Ketones, ur: NEGATIVE mg/dL
Nitrite: NEGATIVE
Protein, ur: >=300 mg/dL — AB
Specific Gravity, Urine: 1.016 (ref 1.005–1.030)
pH: 7 (ref 5.0–8.0)

## 2022-08-10 LAB — CBC
HCT: 39.2 % (ref 36.0–46.0)
Hemoglobin: 12.2 g/dL (ref 12.0–15.0)
MCH: 28 pg (ref 26.0–34.0)
MCHC: 31.1 g/dL (ref 30.0–36.0)
MCV: 90.1 fL (ref 80.0–100.0)
Platelets: 204 10*3/uL (ref 150–400)
RBC: 4.35 MIL/uL (ref 3.87–5.11)
RDW: 21.6 % — ABNORMAL HIGH (ref 11.5–15.5)
WBC: 5.2 10*3/uL (ref 4.0–10.5)
nRBC: 0 % (ref 0.0–0.2)

## 2022-08-10 LAB — MRSA NEXT GEN BY PCR, NASAL: MRSA by PCR Next Gen: NOT DETECTED

## 2022-08-10 MED ORDER — ALUM & MAG HYDROXIDE-SIMETH 200-200-20 MG/5ML PO SUSP
30.0000 mL | Freq: Four times a day (QID) | ORAL | Status: DC | PRN
  Administered 2022-08-10 (×2): 30 mL via ORAL
  Filled 2022-08-10 (×2): qty 30

## 2022-08-10 MED ORDER — CLONIDINE HCL 0.2 MG PO TABS
0.2000 mg | ORAL_TABLET | Freq: Three times a day (TID) | ORAL | Status: DC
  Administered 2022-08-10 – 2022-08-12 (×8): 0.2 mg via ORAL
  Filled 2022-08-10 (×8): qty 1

## 2022-08-10 MED ORDER — CHLORHEXIDINE GLUCONATE CLOTH 2 % EX PADS
6.0000 | MEDICATED_PAD | Freq: Every day | CUTANEOUS | Status: DC
  Administered 2022-08-11 – 2022-08-12 (×2): 6 via TOPICAL

## 2022-08-10 MED ORDER — HYDRALAZINE HCL 25 MG PO TABS
100.0000 mg | ORAL_TABLET | Freq: Three times a day (TID) | ORAL | Status: DC
  Administered 2022-08-10 – 2022-08-12 (×8): 100 mg via ORAL
  Filled 2022-08-10 (×8): qty 4

## 2022-08-10 NOTE — Consult Note (Addendum)
[employer]  Reason for Consult: Abdominal pain  Referring Physician:  [name] PA ED   Chief Complaint   Abdominal Pain     HPI: Cathy [name] is a 41 y.o. female with abdominal pain in the setting of peritoneal edema and fluid overload after missing dialysis. She had complained of nausea/vomiting and abdominal pain. She has unfortunately been missing dialysis due to child care issues. I was called last night and reviewed the CT and noted no concern for any issues with bowel and only edema in the periportal system, concern for liver lesions, and mesenteric edema. All of this edema goes along with fluid overload. She also can have uremia induced nausea and vomiting.   Past Medical History:  Diagnosis Date   Anasarca associated with disorder of kidney 06/08/2021   Anxiety    Asthma    Bipolar depression ([place])    Depression    Dyspnea    ESRD (end stage renal disease) on dialysis ([facility]) 10/2020   GERD (gastroesophageal reflux disease)    Gonorrhea    Lupus (HCC)    Pelvic inflammatory disease (PID)    Pleural effusion 06/08/2021   Pulmonary embolism ([facility]) 01/31/2022   Renal hypertension    Trichomonas infection     Past Surgical History:  Procedure Laterality Date   AV FISTULA PLACEMENT Left 06/10/2022   Procedure: LEFT ARM ARTERIOVENOUS (AV) FISTULA CREATION;  Surgeon: [name], MD;  Location: [facility];  Service: Vascular;  Laterality: Left;   IR FLUORO GUIDE CV LINE RIGHT  11/30/2020   IR FLUORO GUIDE CV LINE RIGHT  06/13/2021   IR FLUORO GUIDE CV LINE RIGHT  02/01/2022   IR REMOVAL TUN CV CATH W/O FL  06/11/2021   IR REMOVAL TUN CV CATH W/O FL  01/29/2022   IR US GUIDE VASC ACCESS RIGHT  11/30/2020   IR US GUIDE VASC ACCESS RIGHT  06/13/2021   IR US GUIDE VASC ACCESS RIGHT  02/03/2022   RENAL BIOPSY     TEE WITHOUT CARDIOVERSION N/A 06/13/2021   Procedure: TRANSESOPHAGEAL ECHOCARDIOGRAM (TEE);  Surgeon: Josue Hector, MD;  Location: [facility];   Service: Cardiovascular;  Laterality: N/A;   TEE WITHOUT CARDIOVERSION N/A 02/03/2022   Procedure: TRANSESOPHAGEAL ECHOCARDIOGRAM (TEE);  Surgeon: Berniece Salines, DO;  Location: [facility];  Service: Cardiovascular;  Laterality: N/A;   TUBAL LIGATION N/A 02/03/2019   Procedure: POST PARTUM TUBAL LIGATION;  Surgeon: Aletha Halim, MD;  Location: [facility];  Service: Gynecology;  Laterality: N/A;    Family History  Problem Relation Age of Onset   Diabetes Maternal Grandmother    Hypertension Maternal Grandmother    Diabetes Maternal Grandfather    Hypertension Maternal Grandfather    Diabetes Paternal Grandmother    Hypertension Paternal Grandmother    Diabetes Paternal Grandfather    Hypertension Paternal Grandfather     Social History   Tobacco Use   Smoking status: Some Days    Types: Cigarettes   Smokeless tobacco: Never   Tobacco comments:    1-2 cigarettes daily   Vaping Use   Vaping Use: Never used  Substance Use Topics   Alcohol use: Not Currently   Drug use: Not Currently    Types: Marijuana    Comment: 3 times a week     Medications: I have reviewed the patient's current medications. Prior to Admission:  Medications Prior to Admission  Medication Sig Dispense Refill Last Dose   acetaminophen (TYLENOL) 500 MG  tablet Take 1,000 mg by mouth every 6 (six) hours as needed for moderate pain.   unknown   albuterol (VENTOLIN HFA) 108 (90 Base) MCG/ACT inhaler Inhale 2 puffs into the lungs every 6 (six) hours as needed for wheezing or shortness of breath. 8 g 1 unknown   B Complex-C-Folic Acid (DIALYVITE 323) 0.8 MG TABS Take 1 tablet by mouth daily.   Past Week   calcium acetate (PHOSLO) 667 MG capsule Take 2 capsules (1,334 mg total) by mouth 3 (three) times daily with meals. 180 capsule 1 08/04/2022   carvedilol (COREG) 25 MG tablet Take 1 tablet (25 mg total) by mouth 2 (two) times daily with a meal. 60 tablet 1 08/04/2022   cloNIDine (CATAPRES) 0.2 MG tablet Take 1 tablet  (0.2 mg total) by mouth every 8 (eight) hours. 90 tablet 1 08/04/2022   Doxercalciferol (HECTOROL IV) Doxercalciferol (Hectorol)   08/02/2022   hydrALAZINE (APRESOLINE) 100 MG tablet Take 1 tablet (100 mg total) by mouth 3 (three) times daily. 90 tablet 1 08/04/2022   ipratropium (ATROVENT HFA) 17 MCG/ACT inhaler Inhale 2 puffs into the lungs every 6 (six) hours as needed for wheezing. (Patient taking differently: Inhale 2 puffs into the lungs at bedtime.) 1 each 12 08/08/2022   losartan (COZAAR) 100 MG tablet Take 1 tablet (100 mg total) by mouth at bedtime. 30 tablet 1 08/08/2022   Blood Pressure Monitoring (OMRON 3 SERIES BP MONITOR) DEVI Use as directed 2 times a day 1 each 0    Scheduled:  apixaban  5 mg Oral BID   calcium acetate  1,334 mg Oral TID WC   carvedilol  25 mg Oral BID WC   Chlorhexidine Gluconate Cloth  6 each Topical Q0600   cloNIDine  0.2 mg Oral Q8H   hydrALAZINE  100 mg Oral TID   influenza vac split quadrivalent PF  0.5 mL Intramuscular Tomorrow-1000   losartan  100 mg Oral QHS   sodium bicarbonate  50 mEq Intravenous Once   sodium zirconium cyclosilicate  10 g Oral Once   Continuous:  cefTRIAXone (ROCEPHIN)  IV 2 g (08/09/22 2124)   niCARDipine 7.5 mg/hr (08/10/22 0832)   FTD:DUKGURK, HYDROmorphone (DILAUDID) injection, ondansetron **OR** ondansetron (ZOFRAN) IV  Allergies  Allergen Reactions   Oxycodone Itching     ROS:  A comprehensive review of systems was negative except for: Gastrointestinal: positive for abdominal pain, nausea, and vomiting Genitourinary: positive for ESRD, fluid overload  Blood pressure 131/71, pulse 62, temperature 99.4 F (37.4 C), temperature source Oral, resp. rate 14, height 5\' 7"  (1.702 m), weight 59.7 kg, SpO2 99 %, unknown if currently breastfeeding. Physical Exam Vitals reviewed.  HENT:     Head: Normocephalic.  Cardiovascular:     Rate and Rhythm: Regular rhythm.  Pulmonary:     Effort: Pulmonary effort is normal.   Abdominal:     General: There is distension.     Palpations: Abdomen is soft.     Tenderness: There is generalized abdominal tenderness.     Hernia: No hernia is present.     Comments: Generalized mild tenderness, no rebound or guarding, soft abdomen, deep palpation more pain  Skin:    General: Skin is warm.  Neurological:     General: No focal deficit present.     Mental Status: She is alert and oriented to person, place, and time.  Psychiatric:        Mood and Affect: Mood normal.  Behavior: Behavior normal.     Results: Results for orders placed or performed during the hospital encounter of 08/09/22 (from the past 48 hour(s))  CBC with Differential     Status: Abnormal   Collection Time: 08/09/22 12:20 PM  Result Value Ref Range   WBC 5.4 4.0 - 10.5 K/uL   RBC 4.22 3.87 - 5.11 MIL/uL   Hemoglobin 11.9 (L) 12.0 - 15.0 g/dL   HCT 38.0 36.0 - 46.0 %   MCV 90.0 80.0 - 100.0 fL   MCH 28.2 26.0 - 34.0 pg   MCHC 31.3 30.0 - 36.0 g/dL   RDW 21.4 (H) 11.5 - 15.5 %   Platelets 213 150 - 400 K/uL   nRBC 0.0 0.0 - 0.2 %   Neutrophils Relative % 64 %   Neutro Abs 3.5 1.7 - 7.7 K/uL   Lymphocytes Relative 20 %   Lymphs Abs 1.1 0.7 - 4.0 K/uL   Monocytes Relative 10 %   Monocytes Absolute 0.6 0.1 - 1.0 K/uL   Eosinophils Relative 4 %   Eosinophils Absolute 0.2 0.0 - 0.5 K/uL   Basophils Relative 1 %   Basophils Absolute 0.0 0.0 - 0.1 K/uL   Immature Granulocytes 1 %   Abs Immature Granulocytes 0.04 0.00 - 0.07 K/uL    Comment: Performed at [facility], [address], [place], [place] [address]  Comprehensive metabolic panel     Status: Abnormal   Collection Time: 08/09/22 12:20 PM  Result Value Ref Range   Sodium 140 135 - 145 mmol/L   Potassium 6.1 (H) 3.5 - 5.1 mmol/L   Chloride 106 98 - 111 mmol/L   CO2 22 22 - 32 mmol/L   Glucose, Bld 95 70 - 99 mg/dL    Comment: Glucose reference range applies only to samples taken after fasting for at least 8 hours.   BUN 99  (H) 6 - 20 mg/dL   Creatinine, Ser 20.14 (H) 0.44 - 1.00 mg/dL   Calcium 8.2 (L) 8.9 - 10.3 mg/dL   Total Protein 7.2 6.5 - 8.1 g/dL   Albumin 3.5 3.5 - 5.0 g/dL   AST 35 15 - 41 U/L   ALT 34 0 - 44 U/L   Alkaline Phosphatase 210 (H) 38 - 126 U/L   Total Bilirubin 0.8 0.3 - 1.2 mg/dL   GFR, Estimated 2 (L) >60 mL/min    Comment: (NOTE) Calculated using the CKD-EPI Creatinine Equation (2021)    Anion gap 12 5 - 15    Comment: Performed at [facility], [address], [place], [place] [address]  Lipase, blood     Status: Abnormal   Collection Time: 08/09/22 12:20 PM  Result Value Ref Range   Lipase 52 (H) 11 - 51 U/L    Comment: Performed at [facility], [address], [place], [place] [address]  MRSA Next Gen by PCR, Nasal     Status: None   Collection Time: 08/09/22  8:43 PM   Specimen: Nasal Mucosa; Nasal Swab  Result Value Ref Range   MRSA by PCR Next Gen NOT DETECTED NOT DETECTED    Comment: (NOTE) The GeneXpert MRSA Assay (FDA approved for NASAL specimens only), is one component of a comprehensive MRSA colonization surveillance program. It is not intended to diagnose MRSA infection nor to guide or monitor treatment for MRSA infections. Test performance is not FDA approved in patients less than 79 years old. Performed at [facility], [address] [place], [place] [address]  Basic metabolic panel     Status: Abnormal   Collection Time: 08/09/22 10:47 PM  Result Value Ref Range   Sodium 136 135 - 145 mmol/L   Potassium 3.9 3.5 - 5.1 mmol/L   Chloride 97 (L) 98 - 111 mmol/L   CO2 26 22 - 32 mmol/L   Glucose, Bld 95 70 - 99 mg/dL    Comment: Glucose reference range applies only to samples taken after fasting for at least 8 hours.   BUN 42 (H) 6 - 20 mg/dL   Creatinine, Ser 11.09 (H) 0.44 - 1.00 mg/dL   Calcium 8.5 (L) 8.9 - 10.3 mg/dL   GFR, Estimated 4 (L) >60 mL/min    Comment: (NOTE) Calculated using the CKD-EPI Creatinine Equation (2021)    Anion gap 13 5 - 15     Comment: Performed at [facility], [address], [place], [place] [address]  CBC     Status: Abnormal   Collection Time: 08/10/22  3:47 AM  Result Value Ref Range   WBC 5.2 4.0 - 10.5 K/uL   RBC 4.35 3.87 - 5.11 MIL/uL   Hemoglobin 12.2 12.0 - 15.0 g/dL   HCT 39.2 36.0 - 46.0 %   MCV 90.1 80.0 - 100.0 fL   MCH 28.0 26.0 - 34.0 pg   MCHC 31.1 30.0 - 36.0 g/dL   RDW 21.6 (H) 11.5 - 15.5 %   Platelets 204 150 - 400 K/uL   nRBC 0.0 0.0 - 0.2 %    Comment: Performed at [facility], [address], [place], [place] [address]  Comprehensive metabolic panel     Status: Abnormal   Collection Time: 08/10/22  3:47 AM  Result Value Ref Range   Sodium 138 135 - 145 mmol/L   Potassium 4.4 3.5 - 5.1 mmol/L   Chloride 100 98 - 111 mmol/L   CO2 25 22 - 32 mmol/L   Glucose, Bld 79 70 - 99 mg/dL    Comment: Glucose reference range applies only to samples taken after fasting for at least 8 hours.   BUN 42 (H) 6 - 20 mg/dL   Creatinine, Ser 11.66 (H) 0.44 - 1.00 mg/dL   Calcium 7.8 (L) 8.9 - 10.3 mg/dL   Total Protein 7.3 6.5 - 8.1 g/dL   Albumin 3.4 (L) 3.5 - 5.0 g/dL   AST 27 15 - 41 U/L   ALT 32 0 - 44 U/L   Alkaline Phosphatase 208 (H) 38 - 126 U/L   Total Bilirubin 0.8 0.3 - 1.2 mg/dL   GFR, Estimated 4 (L) >60 mL/min    Comment: (NOTE) Calculated using the CKD-EPI Creatinine Equation (2021)    Anion gap 13 5 - 15    Comment: Performed at [facility], [address], [place], [place] [address]   Personally reviewed- edema throughout but no signs of bowel wall thickening or pneumatosis, no pneumoperitoneum, no peritoneal enhancement  CT ABDOMEN PELVIS W CONTRAST  Result Date: 08/09/2022 CLINICAL DATA:  Generalized abdominal pain and nausea and vomiting for 2 days. On dialysis. EXAM: CT ABDOMEN AND PELVIS WITH CONTRAST TECHNIQUE: Multidetector CT imaging of the abdomen and pelvis was performed using the standard protocol following bolus administration of intravenous contrast. RADIATION  DOSE REDUCTION: This exam was performed according to the departmental dose-optimization program which includes automated exposure control, adjustment of the mA and/or kV according to patient size and/or use of iterative reconstruction technique. CONTRAST:  128mL OMNIPAQUE IOHEXOL 300 MG/ML  SOLN COMPARISON:  Noncontrast  CT on 03/17/2022 FINDINGS: Lower Chest: Stable marked cardiomegaly. Stable right basilar scarring. Hepatobiliary: Hypertrophy of caudate and left hepatic lobes is suspicious for cirrhosis. Periportal edema and enhancement is seen throughout the liver, which is nonspecific and may be due to hepatitis or hepatic congestion syndrome. Multiple tiny sub-centimeter hypervascular foci are also seen throughout the liver, which may represent cirrhotic nodules or hepatocellular carcinoma. Diffuse gallbladder wall thickening is likely secondary to hepatocellular disease. No evidence of biliary ductal dilatation. Pancreas:  No mass or inflammatory changes. Spleen: Within normal limits in size and appearance. Adrenals/Urinary Tract: No suspicious masses identified. No evidence of ureteral calculi or hydronephrosis. Stomach/Bowel: No evidence of obstruction, inflammatory process or abnormal fluid collections. Vascular/Lymphatic: Shotty retroperitoneal lymph nodes in the left paraaortic region show no significant change. No acute vascular findings. Reproductive:  No mass or other significant abnormality. Other: Diffuse mesenteric edema and mild-to-moderate ascites is noted. Musculoskeletal:  No suspicious bone lesions identified. IMPRESSION: Diffuse periportal edema and enhancement throughout the liver is nonspecific, and may be due to hepatitis or hepatic congestion syndrome. Probable hepatic cirrhosis. Multiple tiny sub-centimeter hypervascular foci throughout the liver, which may represent cirrhotic nodules or hepatocellular carcinoma. Recommend abdomen MRI without and with contrast for further characterization.  Mild-to-moderate ascites and diffuse mesenteric edema. Stable marked cardiomegaly. Electronically Signed   By: Marlaine Hind M.D.   On: 08/09/2022 15:08     Assessment & Plan:  Cathy [name] is a 41 y.o. female with fluid over load and signs of edema in her mesentery and around her portal system. She is feeling better after dialysis she reports. I do not see anything in her CT that needs surgical intervention. They are recommending MRI for possible liver nodules concern for carcinoma, defer to Hospitalist and GI.   Diet as tolerated No surgical intervention indicated Call back if something changes   All questions were answered to the satisfaction of the patient.    Virl Cagey 08/10/2022, 10:51 AM

## 2022-08-10 NOTE — Progress Notes (Signed)
PROGRESS NOTE    Cathy [name]  WJX:[id number] DOB: 27-Mar-1981 DOA: 08/09/2022 PCP: Pcp, No   Brief Narrative:  Cathy [name] is a 41 y.o. female with medical history significant of end-stage renal disease on dialysis, lupus with lupus nephritis.  Patient presents with generalized stabbing periumbilical abdominal pain that started 2 days ago with nausea and vomiting and distention.  She has not been able to tolerate oral food or liquid for 24 hours.  She denies fevers, chills, nausea, vomiting.  She has not been able to tolerate any of her oral medications. She does have difficulty with childcare and has missed the last 3 episodes of dialysis.  Patient was admitted with abdominal pain with small ascites and mild suspicion for SBP.  She received hemodialysis 9/9 and currently has hypertensive crisis for which she is on Cardene drip.  Assessment & Plan:   Principal Problem:   Abdominal pain Active Problems:   Hyperkalemia   Lupus nephritis (HCC)   Benign essential HTN   HFrEF (heart failure with reduced ejection fraction) (HCC)   Hypertensive urgency   History of pulmonary embolus   Ascites  Assessment and Plan:   Abdominal pain ED PE consulted GI who has concerns about spontaneous bacterial peritonitis although this is less likely.  Continue on Rocephin for now and anticipate paracentesis in a.m. We will treat the patient's pain Zofran for nausea and vomiting We will keep the patient on clear liquids for now and advance as tolerated. End-stage renal disease on dialysis secondary to lupus nephritis Dialysis performed 9/9 with further hemodialysis 9/11 anticipated. Hyperkalemia Currently resolved, continue to monitor Ascites Appears minimal, paracentesis to be attempted tomorrow Hypertension with hypertensive urgency We will restart the patient's medications. Hydralazine 10 mg now IV Resume home hydralazine and clonidine and wean off Cardene drip, once weaned may transfer  to telemetry History of pulmonary embolism    DVT prophylaxis: Eliquis Code Status: Full Family Communication: None at bedside Disposition Plan:  Status is: Inpatient Remains inpatient appropriate because: IV medications.   Consultants:  [facility]  Procedures:  None  Antimicrobials:  Anti-infectives (From admission, onward)    Start     Dose/Rate Route Frequency Ordered Stop   08/09/22 1930  cefTRIAXone (ROCEPHIN) 2 g in sodium chloride 0.9 % 100 mL IVPB        2 g 200 mL/hr over 30 Minutes Intravenous Every 24 hours 08/09/22 1916         Subjective: Patient seen and evaluated today with some mild abdominal pain and nausea this morning.  Blood pressures continue to remain elevated and she is on Cardene drip.  Objective: Vitals:   08/10/22 0600 08/10/22 0715 08/10/22 0800 08/10/22 0900  BP: (!) 155/89  (!) 157/83 131/71  Pulse:    62  Resp: 16 17 16 14   Temp:  99.4 F (37.4 C)    TempSrc:  Oral    SpO2:    99%  Weight: 59.7 kg     Height:        Intake/Output Summary (Last 24 hours) at 08/10/2022 1133 Last data filed at 08/10/2022 0900 Gross per 24 hour  Intake 1632.16 ml  Output 3975 ml  Net -2342.84 ml   Filed Weights   08/09/22 1108 08/09/22 2200 08/10/22 0600  Weight: 59 kg 57.4 kg 59.7 kg    Examination:  General exam: Appears calm and comfortable  Respiratory system: Clear to auscultation. Respiratory effort normal. Cardiovascular system: S1 & S2 heard, RRR.  Gastrointestinal system: Abdomen is only tender to palpation, nondistended Central nervous system: Alert and awake Extremities: No edema Skin: No significant lesions noted Psychiatry: Flat affect.    Data Reviewed: I have personally reviewed following labs and imaging studies  CBC: Recent Labs  Lab 08/09/22 1220 08/10/22 0347  WBC 5.4 5.2  NEUTROABS 3.5  --   HGB 11.9* 12.2  HCT 38.0 39.2  MCV 90.0 90.1  PLT 213 349   Basic Metabolic Panel: Recent Labs  Lab  08/09/22 1220 08/09/22 2247 08/10/22 0347  NA 140 136 138  K 6.1* 3.9 4.4  CL 106 97* 100  CO2 22 26 25   GLUCOSE 95 95 79  BUN 99* 42* 42*  CREATININE 20.14* 11.09* 11.66*  CALCIUM 8.2* 8.5* 7.8*   GFR: Estimated Creatinine Clearance: 6.2 mL/min (A) (by C-G formula based on SCr of 11.66 mg/dL (H)). Liver Function Tests: Recent Labs  Lab 08/09/22 1220 08/10/22 0347  AST 35 27  ALT 34 32  ALKPHOS 210* 208*  BILITOT 0.8 0.8  PROT 7.2 7.3  ALBUMIN 3.5 3.4*   Recent Labs  Lab 08/09/22 1220  LIPASE 52*   No results for input(s): "AMMONIA" in the last 168 hours. Coagulation Profile: No results for input(s): "INR", "PROTIME" in the last 168 hours. Cardiac Enzymes: No results for input(s): "CKTOTAL", "CKMB", "CKMBINDEX", "TROPONINI" in the last 168 hours. BNP (last 3 results) No results for input(s): "PROBNP" in the last 8760 hours. HbA1C: No results for input(s): "HGBA1C" in the last 72 hours. CBG: No results for input(s): "GLUCAP" in the last 168 hours. Lipid Profile: No results for input(s): "CHOL", "HDL", "LDLCALC", "TRIG", "CHOLHDL", "LDLDIRECT" in the last 72 hours. Thyroid Function Tests: No results for input(s): "TSH", "T4TOTAL", "FREET4", "T3FREE", "THYROIDAB" in the last 72 hours. Anemia Panel: No results for input(s): "VITAMINB12", "FOLATE", "FERRITIN", "TIBC", "IRON", "RETICCTPCT" in the last 72 hours. Sepsis Labs: No results for input(s): "PROCALCITON", "LATICACIDVEN" in the last 168 hours.  Recent Results (from the past 240 hour(s))  MRSA Next Gen by PCR, Nasal     Status: None   Collection Time: 08/09/22  8:43 PM   Specimen: Nasal Mucosa; Nasal Swab  Result Value Ref Range Status   MRSA by PCR Next Gen NOT DETECTED NOT DETECTED Final    Comment: (NOTE) The GeneXpert MRSA Assay (FDA approved for NASAL specimens only), is one component of a comprehensive MRSA colonization surveillance program. It is not intended to diagnose MRSA infection nor to  guide or monitor treatment for MRSA infections. Test performance is not FDA approved in patients less than 103 years old. Performed at [facility], [address], [place], [place] [address]          Radiology Studies: CT ABDOMEN PELVIS W CONTRAST  Result Date: 08/09/2022 CLINICAL DATA:  Generalized abdominal pain and nausea and vomiting for 2 days. On dialysis. EXAM: CT ABDOMEN AND PELVIS WITH CONTRAST TECHNIQUE: Multidetector CT imaging of the abdomen and pelvis was performed using the standard protocol following bolus administration of intravenous contrast. RADIATION DOSE REDUCTION: This exam was performed according to the departmental dose-optimization program which includes automated exposure control, adjustment of the mA and/or kV according to patient size and/or use of iterative reconstruction technique. CONTRAST:  138mL OMNIPAQUE IOHEXOL 300 MG/ML  SOLN COMPARISON:  Noncontrast CT on 03/17/2022 FINDINGS: Lower Chest: Stable marked cardiomegaly. Stable right basilar scarring. Hepatobiliary: Hypertrophy of caudate and left hepatic lobes is suspicious for cirrhosis. Periportal edema and enhancement is seen throughout the  liver, which is nonspecific and may be due to hepatitis or hepatic congestion syndrome. Multiple tiny sub-centimeter hypervascular foci are also seen throughout the liver, which may represent cirrhotic nodules or hepatocellular carcinoma. Diffuse gallbladder wall thickening is likely secondary to hepatocellular disease. No evidence of biliary ductal dilatation. Pancreas:  No mass or inflammatory changes. Spleen: Within normal limits in size and appearance. Adrenals/Urinary Tract: No suspicious masses identified. No evidence of ureteral calculi or hydronephrosis. Stomach/Bowel: No evidence of obstruction, inflammatory process or abnormal fluid collections. Vascular/Lymphatic: Shotty retroperitoneal lymph nodes in the left paraaortic region show no significant change. No acute  vascular findings. Reproductive:  No mass or other significant abnormality. Other: Diffuse mesenteric edema and mild-to-moderate ascites is noted. Musculoskeletal:  No suspicious bone lesions identified. IMPRESSION: Diffuse periportal edema and enhancement throughout the liver is nonspecific, and may be due to hepatitis or hepatic congestion syndrome. Probable hepatic cirrhosis. Multiple tiny sub-centimeter hypervascular foci throughout the liver, which may represent cirrhotic nodules or hepatocellular carcinoma. Recommend abdomen MRI without and with contrast for further characterization. Mild-to-moderate ascites and diffuse mesenteric edema. Stable marked cardiomegaly. Electronically Signed   By: Marlaine Hind M.D.   On: 08/09/2022 15:08        Scheduled Meds:  apixaban  5 mg Oral BID   calcium acetate  1,334 mg Oral TID WC   carvedilol  25 mg Oral BID WC   Chlorhexidine Gluconate Cloth  6 each Topical Q0600   cloNIDine  0.2 mg Oral Q8H   hydrALAZINE  100 mg Oral TID   losartan  100 mg Oral QHS   sodium bicarbonate  50 mEq Intravenous Once   sodium zirconium cyclosilicate  10 g Oral Once   Continuous Infusions:  cefTRIAXone (ROCEPHIN)  IV 2 g (08/09/22 2124)   niCARDipine 7.5 mg/hr (08/10/22 1109)     LOS: 1 day    Time spent: 35 minutes    Pope Brunty Darleen Crocker, DO [facility]  If 7PM-7AM, please contact night-coverage [email or web address] 08/10/2022, 11:33 AM

## 2022-08-10 NOTE — Consult Note (Signed)
Maylon Peppers, M.D. Gastroenterology & Hepatology                                           Patient Name: Cathy [name] Account #: @FLAACCTNO @   MRN: [id number] Admission Date: 08/09/2022 Date of Evaluation:  08/10/2022 Time of Evaluation: 11:16 AM   Referring Physician: [name], PA  Chief Complaint: Abdominal pain  HPI:  This is a 41 y.o. female with history of bipolar disorder, depression, end-stage renal disease on dialysis, lupus, pulmonary embolism, anxiety, who came to the hospital after presenting worsening abdominal pain for the last couple of days.  Patient states that she missed 2 of her dialysis sessions last week.  States that her last dialysis was performed a week ago.  Since then she has presented worsening nausea and multiple episodes of vomiting with worsening pain in her upper abdomen.  Denies any fever, chills, lightheadedness or dizziness.  Reports feeling significant distention of her abdomen and extremities.  In the ED, she was hypertensive with blood pressure 196/120 but with tachycardia, and afebrile. Labs were remarkable for potassium 6.6, sodium 139, creatinine 18.18, BUN 100, ALT of 55, AST of 40, total bilirubin 0.6, alkaline phosphatase 250, CBC with hemoglobin of 10.7, also count 5.2 and platelets 179.  CT abdomen pelvis with IV contrast showed diffuse periportal edema and liver enhancement, questionable nodularity of the liver versus liver lesions.  Per radiology recommendations recommended MRI to further evaluate the liver parenchyma.  There were changes suggestive of possible hepatic cirrhosis.  There was also presence of mild to moderate ascites and mesenteric edema.  Patient underwent emergent dialysis yesterday.  Past Medical History: SEE CHRONIC ISSSUES: Past Medical History:  Diagnosis Date   Anasarca associated with disorder of kidney 06/08/2021   Anxiety    Asthma    Bipolar depression ([place])    Depression    Dyspnea    ESRD (end stage renal  disease) on dialysis ([place]) 10/2020   GERD (gastroesophageal reflux disease)    Gonorrhea    Lupus ([facility])    Pelvic inflammatory disease (PID)    Pleural effusion 06/08/2021   Pulmonary embolism ([place]) 01/31/2022   Renal hypertension    Trichomonas infection    Past Surgical History:  Past Surgical History:  Procedure Laterality Date   AV FISTULA PLACEMENT Left 06/10/2022   Procedure: LEFT ARM ARTERIOVENOUS (AV) FISTULA CREATION;  Surgeon: [name], MD;  Location: [facility];  Service: Vascular;  Laterality: Left;   IR FLUORO GUIDE CV LINE RIGHT  11/30/2020   IR FLUORO GUIDE CV LINE RIGHT  06/13/2021   IR FLUORO GUIDE CV LINE RIGHT  02/01/2022   IR REMOVAL TUN CV CATH W/O FL  06/11/2021   IR REMOVAL TUN CV CATH W/O FL  01/29/2022   IR US GUIDE VASC ACCESS RIGHT  11/30/2020   IR US GUIDE VASC ACCESS RIGHT  06/13/2021   IR US GUIDE VASC ACCESS RIGHT  02/03/2022   RENAL BIOPSY     TEE WITHOUT CARDIOVERSION N/A 06/13/2021   Procedure: TRANSESOPHAGEAL ECHOCARDIOGRAM (TEE);  Surgeon: Josue Hector, MD;  Location: [facility];  Service: Cardiovascular;  Laterality: N/A;   TEE WITHOUT CARDIOVERSION N/A 02/03/2022   Procedure: TRANSESOPHAGEAL ECHOCARDIOGRAM (TEE);  Surgeon: Berniece Salines, DO;  Location: [facility];  Service: Cardiovascular;  Laterality: N/A;   TUBAL LIGATION N/A 02/03/2019   Procedure: POST  PARTUM TUBAL LIGATION;  Surgeon: Aletha Halim, MD;  Location: [facility];  Service: Gynecology;  Laterality: N/A;   Family History:  Family History  Problem Relation Age of Onset   Diabetes Maternal Grandmother    Hypertension Maternal Grandmother    Diabetes Maternal Grandfather    Hypertension Maternal Grandfather    Diabetes Paternal Grandmother    Hypertension Paternal Grandmother    Diabetes Paternal Grandfather    Hypertension Paternal Grandfather    Social History:  Social History   Tobacco Use   Smoking status: Some Days    Types: Cigarettes   Smokeless tobacco: Never    Tobacco comments:    1-2 cigarettes daily   Vaping Use   Vaping Use: Never used  Substance Use Topics   Alcohol use: Not Currently   Drug use: Not Currently    Types: Marijuana    Comment: 3 times a week     Home Medications:  Prior to Admission medications   Medication Sig Start Date End Date Taking? Authorizing Provider  acetaminophen (TYLENOL) 500 MG tablet Take 1,000 mg by mouth every 6 (six) hours as needed for moderate pain.   Yes [provider]  albuterol (VENTOLIN HFA) 108 (90 Base) MCG/ACT inhaler Inhale 2 puffs into the lungs every 6 (six) hours as needed for wheezing or shortness of breath. 12/17/21 12/17/22 Yes Rai, Ripudeep K, MD  B Complex-C-Folic Acid (DIALYVITE 771) 0.8 MG TABS Take 1 tablet by mouth daily. 07/05/22  Yes [provider]  calcium acetate (PHOSLO) 667 MG capsule Take 2 capsules (1,334 mg total) by mouth 3 (three) times daily with meals. 03/05/22  Yes Tat, Shanon Brow, MD  carvedilol (COREG) 25 MG tablet Take 1 tablet (25 mg total) by mouth 2 (two) times daily with a meal. 03/05/22  Yes Tat, Shanon Brow, MD  cloNIDine (CATAPRES) 0.2 MG tablet Take 1 tablet (0.2 mg total) by mouth every 8 (eight) hours. 03/05/22  Yes Tat, Shanon Brow, MD  Doxercalciferol (HECTOROL IV) Doxercalciferol (Hectorol) 03/06/22 03/05/23 Yes [provider]  hydrALAZINE (APRESOLINE) 100 MG tablet Take 1 tablet (100 mg total) by mouth 3 (three) times daily. 03/05/22  Yes Tat, Shanon Brow, MD  ipratropium (ATROVENT HFA) 17 MCG/ACT inhaler Inhale 2 puffs into the lungs every 6 (six) hours as needed for wheezing. Patient taking differently: Inhale 2 puffs into the lungs at bedtime. 12/17/21 12/17/22 Yes Rai, Ripudeep K, MD  losartan (COZAAR) 100 MG tablet Take 1 tablet (100 mg total) by mouth at bedtime. 03/05/22  Yes Tat, Shanon Brow, MD  Blood Pressure Monitoring (OMRON 3 SERIES BP MONITOR) DEVI Use as directed 2 times a day 01/09/22   Shelly Coss, MD  fenofibrate 160 MG tablet Take 1 tablet (160 mg total) by  mouth daily. 12/31/20 01/20/21  Elmarie Shiley, MD    Inpatient Medications:  Current Facility-Administered Medications:    apixaban (ELIQUIS) tablet 5 mg, 5 mg, Oral, BID, Truett Mainland, DO, 5 mg at 08/10/22 1108   calcium acetate (PHOSLO) capsule 1,334 mg, 1,334 mg, Oral, TID WC, Truett Mainland, DO, 1,334 mg at 08/10/22 1108   carvedilol (COREG) tablet 25 mg, 25 mg, Oral, BID WC, Truett Mainland, DO, 25 mg at 08/10/22 0737   cefTRIAXone (ROCEPHIN) 2 g in sodium chloride 0.9 % 100 mL IVPB, 2 g, Intravenous, Q24H, Stinson, Jacob J, DO, Last Rate: 200 mL/hr at 08/09/22 2124, 2 g at 08/09/22 2124   Chlorhexidine Gluconate Cloth 2 % PADS 6 each, 6 each, Topical,  A1287, Truett Mainland, DO, 6 each at 08/09/22 2131   cloNIDine (CATAPRES) tablet 0.2 mg, 0.2 mg, Oral, Q8H, Shah, Pratik D, DO, 0.2 mg at 08/10/22 1108   heparin injection 1,000 Units, 1,000 Units, Intracatheter, PRN, Truett Mainland, DO   hydrALAZINE (APRESOLINE) tablet 100 mg, 100 mg, Oral, TID, Manuella Ghazi, Pratik D, DO, 100 mg at 08/10/22 1107   HYDROmorphone (DILAUDID) injection 0.5 mg, 0.5 mg, Intravenous, Q2H PRN, Truett Mainland, DO, 0.5 mg at 08/10/22 8676   influenza vac split quadrivalent PF (FLUARIX) injection 0.5 mL, 0.5 mL, Intramuscular, Tomorrow-1000, Stinson, Jacob J, DO   losartan (COZAAR) tablet 100 mg, 100 mg, Oral, QHS, Stinson, Jacob J, DO, 100 mg at 08/09/22 2140   nicardipine (CARDENE) 42m in 0.86% saline 2066mIV infusion (0.1 mg/ml), 3-15 mg/hr, Intravenous, Continuous, StTruett MainlandDO, Last Rate: 75 mL/hr at 08/10/22 1109, 7.5 mg/hr at 08/10/22 1109   ondansetron (ZOFRAN) tablet 4 mg, 4 mg, Oral, Q6H PRN **OR** ondansetron (ZOFRAN) injection 4 mg, 4 mg, Intravenous, Q6H PRN, StTruett MainlandDO, 4 mg at 08/10/22 0743   sodium bicarbonate injection 50 mEq, 50 mEq, Intravenous, Once, Stinson, Jacob J, DO   sodium zirconium cyclosilicate (LOKELMA) packet 10 g, 10 g, Oral, Once, Stinson, Jacob J,  DO Allergies: Oxycodone  Complete Review of Systems: GENERAL: negative for malaise, night sweats HEENT: No changes in hearing or vision, no nose bleeds or other nasal problems. NECK: Negative for lumps, goiter, pain and significant neck swelling RESPIRATORY: Negative for cough, wheezing CARDIOVASCULAR: Negative for chest pain, leg swelling, palpitations, orthopnea GI: SEE HPI MUSCULOSKELETAL: Negative for joint pain or swelling, back pain, and muscle pain. SKIN: Negative for lesions, rash PSYCH: Negative for sleep disturbance, mood disorder and recent psychosocial stressors. HEMATOLOGY Negative for prolonged bleeding, bruising easily, and swollen nodes. ENDOCRINE: Negative for cold or heat intolerance, polyuria, polydipsia and goiter. NEURO: negative for tremor, gait imbalance, syncope and seizures. The remainder of the review of systems is noncontributory.  Physical Exam: BP 131/71   Pulse 62   Temp 99.4 F (37.4 C) (Oral)   Resp 14   Ht 5' 7"  (1.702 m)   Wt 59.7 kg   SpO2 99%   BMI 20.61 kg/m  GENERAL: The patient is AO x3, in no acute distress.  Slightly somnolent. HEENT: Head is normocephalic and atraumatic. EOMI are intact. Mouth is well hydrated and without lesions. NECK: Supple. No masses LUNGS: Clear to auscultation. No presence of rhonchi/wheezing/rales. Adequate chest expansion HEART: RRR, normal s1 and s2. ABDOMEN: Tender upon palpation of the upper abdomen but no guarding, no peritoneal signs, and nondistended. BS +. No masses. EXTREMITIES: Without any cyanosis, clubbing, rash, lesions or edema. NEUROLOGIC: AOx3, no focal motor deficit. SKIN: no jaundice, no rashes  Laboratory Data CBC:     Component Value Date/Time   WBC 5.2 08/10/2022 0347   RBC 4.35 08/10/2022 0347   HGB 12.2 08/10/2022 0347   HGB 6.6 (L) 12/04/2020 1158   HCT 39.2 08/10/2022 0347   HCT 21.0 (L) 01/12/2019 0904   PLT 204 08/10/2022 0347   PLT 228 01/12/2019 0904   MCV 90.1 08/10/2022  0347   MCV 92 01/12/2019 0904   MCH 28.0 08/10/2022 0347   MCHC 31.1 08/10/2022 0347   RDW 21.6 (H) 08/10/2022 0347   RDW 13.9 01/12/2019 0904   LYMPHSABS 1.1 08/09/2022 1220   LYMPHSABS 0.7 10/04/2018 1131   MONOABS 0.6 08/09/2022 1220   EOSABS 0.2 08/09/2022 1220  EOSABS 0.0 10/04/2018 1131   BASOSABS 0.0 08/09/2022 1220   BASOSABS 0.0 10/04/2018 1131   COAG:  Lab Results  Component Value Date   INR 1.8 (H) 03/01/2022   INR 2.1 (H) 01/29/2022   INR 1.2 12/27/2021    BMP:     Latest Ref Rng & Units 08/10/2022    3:47 AM 08/09/2022   10:47 PM 08/09/2022   12:20 PM  BMP  Glucose 70 - 99 mg/dL 79  95  95   BUN 6 - 20 mg/dL 42  42  99   Creatinine 0.44 - 1.00 mg/dL 11.66  11.09  20.14   Sodium 135 - 145 mmol/L 138  136  140   Potassium 3.5 - 5.1 mmol/L 4.4  3.9  6.1   Chloride 98 - 111 mmol/L 100  97  106   CO2 22 - 32 mmol/L 25  26  22    Calcium 8.9 - 10.3 mg/dL 7.8  8.5  8.2     HEPATIC:     Latest Ref Rng & Units 08/10/2022    3:47 AM 08/09/2022   12:20 PM 07/02/2022   10:34 AM  Hepatic Function  Total Protein 6.5 - 8.1 g/dL 7.3  7.2  7.4   Albumin 3.5 - 5.0 g/dL 3.4  3.5  3.5   AST 15 - 41 U/L 27  35  40   ALT 0 - 44 U/L 32  34  55   Alk Phosphatase 38 - 126 U/L 208  210  250   Total Bilirubin 0.3 - 1.2 mg/dL 0.8  0.8  0.6     CARDIAC:  Lab Results  Component Value Date   TROPONINI <0.03 02/01/2019     Imaging: I personally reviewed and interpreted the available imaging.  Assessment & Plan: Cathy [name] is a 41 y.o. female with history of bipolar disorder, depression, end-stage renal disease on dialysis, lupus, pulmonary embolism, anxiety, who came to the hospital after presenting worsening abdominal pain for the last couple of days.  The patient is presenting a benign exam today and has presented significant improvement of her abdominal pain after undergoing dialysis.  I do suspect that her complaints are related to being under dialyzed/related to uremia,  which may get better with further dialysis sessions.  Nevertheless, given the presence of ascites in her abdomen, may need to rule out SBP with paracentesis.  Will need to check albumin, protein, cytology, gram, culture and cell count.  If positive for SBP, will need to complete a 7-day course of antibiotics with third-generation cephalosporin and will need long-term prophylaxis for SBP.  Regarding the abnormalities found in her CT scan of the abdomen and pelvis with IV contrast, may need to further characterize this with an MRI of the liver with and without IV contrast.  This could be performed as outpatient, as long as there is adequate follow-up.  Notably, she had a hepatitis B surface antibody in the past.  I discussed this with [name] as there was some confusion from 13 regarding the hepatitis B serology.  This patient is immune to hepatitis B.  Harvel Quale, MD Gastroenterology and Hepatology [facility]

## 2022-08-11 ENCOUNTER — Inpatient Hospital Stay (HOSPITAL_COMMUNITY): Payer: Medicare HMO

## 2022-08-11 DIAGNOSIS — R188 Other ascites: Secondary | ICD-10-CM | POA: Diagnosis not present

## 2022-08-11 DIAGNOSIS — K7460 Unspecified cirrhosis of liver: Secondary | ICD-10-CM | POA: Diagnosis not present

## 2022-08-11 DIAGNOSIS — R1084 Generalized abdominal pain: Secondary | ICD-10-CM | POA: Diagnosis not present

## 2022-08-11 LAB — BASIC METABOLIC PANEL
Anion gap: 13 (ref 5–15)
Anion gap: 12 (ref 5–15)
BUN: 42 mg/dL — ABNORMAL HIGH (ref 6–20)
BUN: 46 mg/dL — ABNORMAL HIGH (ref 6–20)
CO2: 26 mmol/L (ref 22–32)
CO2: 23 mmol/L (ref 22–32)
Calcium: 8.5 mg/dL — ABNORMAL LOW (ref 8.9–10.3)
Calcium: 7.7 mg/dL — ABNORMAL LOW (ref 8.9–10.3)
Chloride: 97 mmol/L — ABNORMAL LOW (ref 98–111)
Chloride: 102 mmol/L (ref 98–111)
Creatinine, Ser: 11.09 mg/dL — ABNORMAL HIGH (ref 0.44–1.00)
Creatinine, Ser: 13.69 mg/dL — ABNORMAL HIGH (ref 0.44–1.00)
GFR, Estimated: 4 mL/min — ABNORMAL LOW (ref >60)
GFR, Estimated: 3 mL/min — ABNORMAL LOW (ref >60)
Glucose, Bld: 95 mg/dL (ref 70–99)
Glucose, Bld: 89 mg/dL (ref 70–99)
Potassium: 3.9 mmol/L (ref 3.5–5.1)
Potassium: 5.1 mmol/L (ref 3.5–5.1)
Sodium: 136 mmol/L (ref 135–145)
Sodium: 137 mmol/L (ref 135–145)

## 2022-08-11 LAB — CBC
HCT: 39.9 % (ref 36.0–46.0)
Hemoglobin: 12.1 g/dL (ref 12.0–15.0)
MCH: 27.9 pg (ref 26.0–34.0)
MCHC: 30.3 g/dL (ref 30.0–36.0)
MCV: 91.9 fL (ref 80.0–100.0)
Platelets: 151 10*3/uL (ref 150–400)
RBC: 4.34 MIL/uL (ref 3.87–5.11)
RDW: 21.1 % — ABNORMAL HIGH (ref 11.5–15.5)
WBC: 3.9 10*3/uL — ABNORMAL LOW (ref 4.0–10.5)
nRBC: 0 % (ref 0.0–0.2)

## 2022-08-11 LAB — MAGNESIUM: Magnesium: 2 mg/dL (ref 1.7–2.4)

## 2022-08-11 MED ORDER — HYDRALAZINE HCL 20 MG/ML IJ SOLN
10.0000 mg | INTRAMUSCULAR | Status: DC | PRN
  Administered 2022-08-11: 10 mg via INTRAVENOUS
  Filled 2022-08-11: qty 1

## 2022-08-11 MED ORDER — DOXERCALCIFEROL 4 MCG/2ML IV SOLN
2.0000 ug | INTRAVENOUS | Status: DC
  Administered 2022-08-11: 2 ug via INTRAVENOUS
  Filled 2022-08-11: qty 2

## 2022-08-11 MED ORDER — DIPHENHYDRAMINE HCL 50 MG/ML IJ SOLN
25.0000 mg | Freq: Once | INTRAMUSCULAR | Status: AC
  Administered 2022-08-11: 25 mg via INTRAVENOUS
  Filled 2022-08-11: qty 1

## 2022-08-11 MED ORDER — CHLORHEXIDINE GLUCONATE CLOTH 2 % EX PADS: 6.0000 | MEDICATED_PAD | Freq: Every day | CUTANEOUS | Status: DC

## 2022-08-11 NOTE — Plan of Care (Signed)

## 2022-08-11 NOTE — Progress Notes (Signed)
Gastroenterology Progress Note   Referring Provider: [facility] Primary Care Physician:  Pcp, No Primary Gastroenterologist:  Dr. Jenetta Downer, previously unassigned   Patient ID: Cathy [name]; [id number]; 1981/11/01    Subjective   Improved abdominal pain. Notes pain in RUQ and epigastric. Intermittent nausea. Insufficient fluid present for para. Trace ascites noted. Currently undergoing dialysis at bedside.    Objective   Vital signs in last 24 hours Temp:  [97.9 F (36.6 C)-99.2 F (37.3 C)] 98.4 F (36.9 C) (09/11 0845) Pulse Rate:  [51-66] 56 (09/11 0845) Resp:  [11-21] 11 (09/11 0901) BP: (114-168)/(61-106) 168/100 (09/11 0901) SpO2:  [75 %-100 %] 96 % (09/11 0845) Weight:  [59.8 kg-59.9 kg] 59.9 kg (09/11 0855)    Physical Exam General:   Alert and oriented, pleasant Head:  Normocephalic and atraumatic. Abdomen:  Bowel sounds present, round and distended but soft. TTP epigastric and RUQ without rebound.  Neurologic:  Alert and  oriented x4 Psych:  Alert and cooperative. Normal mood and affect.  Intake/Output from previous day: 09/10 0701 - 09/11 0700 In: 2497.7 [P.O.:1430; I.V.:967.7; IV Piggyback:100] Out: 275 [Urine:275] Intake/Output this shift: No intake/output data recorded.  Lab Results  Recent Labs    08/09/22 1220 08/10/22 0347 08/11/22 0430  WBC 5.4 5.2 3.9*  HGB 11.9* 12.2 12.1  HCT 38.0 39.2 39.9  PLT 213 204 151   BMET Recent Labs    08/09/22 2247 08/10/22 0347 08/11/22 0430  NA 136 138 137  K 3.9 4.4 5.1  CL 97* 100 102  CO2 26 25 23   GLUCOSE 95 79 89  BUN 42* 42* 46*  CREATININE 11.09* 11.66* 13.69*  CALCIUM 8.5* 7.8* 7.7*   LFT Recent Labs    08/09/22 1220 08/10/22 0347  PROT 7.2 7.3  ALBUMIN 3.5 3.4*  AST 35 27  ALT 34 32  ALKPHOS 210* 208*  BILITOT 0.8 0.8     Studies/Results CT ABDOMEN PELVIS W CONTRAST  Result Date: 08/09/2022 CLINICAL DATA:  Generalized abdominal pain and nausea and vomiting for 2  days. On dialysis. EXAM: CT ABDOMEN AND PELVIS WITH CONTRAST TECHNIQUE: Multidetector CT imaging of the abdomen and pelvis was performed using the standard protocol following bolus administration of intravenous contrast. RADIATION DOSE REDUCTION: This exam was performed according to the departmental dose-optimization program which includes automated exposure control, adjustment of the mA and/or kV according to patient size and/or use of iterative reconstruction technique. CONTRAST:  169m OMNIPAQUE IOHEXOL 300 MG/ML  SOLN COMPARISON:  Noncontrast CT on 03/17/2022 FINDINGS: Lower Chest: Stable marked cardiomegaly. Stable right basilar scarring. Hepatobiliary: Hypertrophy of caudate and left hepatic lobes is suspicious for cirrhosis. Periportal edema and enhancement is seen throughout the liver, which is nonspecific and may be due to hepatitis or hepatic congestion syndrome. Multiple tiny sub-centimeter hypervascular foci are also seen throughout the liver, which may represent cirrhotic nodules or hepatocellular carcinoma. Diffuse gallbladder wall thickening is likely secondary to hepatocellular disease. No evidence of biliary ductal dilatation. Pancreas:  No mass or inflammatory changes. Spleen: Within normal limits in size and appearance. Adrenals/Urinary Tract: No suspicious masses identified. No evidence of ureteral calculi or hydronephrosis. Stomach/Bowel: No evidence of obstruction, inflammatory process or abnormal fluid collections. Vascular/Lymphatic: Shotty retroperitoneal lymph nodes in the left paraaortic region show no significant change. No acute vascular findings. Reproductive:  No mass or other significant abnormality. Other: Diffuse mesenteric edema and mild-to-moderate ascites is noted. Musculoskeletal:  No suspicious bone lesions identified. IMPRESSION: Diffuse periportal edema and enhancement  throughout the liver is nonspecific, and may be due to hepatitis or hepatic congestion syndrome. Probable  hepatic cirrhosis. Multiple tiny sub-centimeter hypervascular foci throughout the liver, which may represent cirrhotic nodules or hepatocellular carcinoma. Recommend abdomen MRI without and with contrast for further characterization. Mild-to-moderate ascites and diffuse mesenteric edema. Stable marked cardiomegaly. Electronically Signed   By: Marlaine Hind M.D.   On: 08/09/2022 15:08    Assessment  41 y.o. female with a history of bipolar disorder, depression, end-stage renal disease on dialysis, lupus, pulmonary embolism, heart failure with reduced EF, anxiety, who came to the hospital after presenting worsening abdominal pain, distension, N/V after missing several episodes of dialysis.   CT abd/pelvis with contrast showed non-specific diffuse periportal edema and enhancement throughout liver, probable hepatic cirrhosis, multiple tiny sub-centimeter hypervascular foci throughout liver, differentials including cirrhotic nodules or HCC. MRI recommended. Mild to moderate ascites and diffuse mesenteric edema also noted. Notably, prior CTs as recent as April 2023 showed no focal liver abnormalities.   Para scheduled for today but insufficient fluid present.   Clinically, she has improved from admission, and diet will be advanced to soft. Currently undergoing dialysis at bedside. I suspect her presentation was secondary to uremia. As insufficient ascites present for para, doubt SBP. However, she needs further dedicated imaging of liver with MRI. May need to pursue this as inpatient due to concern for outpatient compliance.   Isolated elevated alk phos: suspect related to hepatic congestion in setting of known history of HF and reduced EF. Could pursue further evaluation as outpatient.    Plan / Recommendations  Check AFP Soft diet Recommend MRI while inpatient instead of outpatient due to concern for outpatient follow-up: will discuss with attending Undergoing dialysis today    LOS: 2 days     08/11/2022, 9:11 AM  Annitta Needs, PhD, ANP-BC [facility]

## 2022-08-11 NOTE — Progress Notes (Signed)
PROGRESS NOTE    Cathy [name]  VOZ:[id number] DOB: [birth year] DOA: 08/09/2022 PCP: Pcp, No   Brief Narrative:    Cathy [name] is a 41 y.o. female with medical history significant of end-stage renal disease on dialysis, lupus with lupus nephritis.  Patient presents with generalized stabbing periumbilical abdominal pain that started 2 days ago with nausea and vomiting and distention.  She has not been able to tolerate oral food or liquid for 24 hours.  She denies fevers, chills, nausea, vomiting.  She has not been able to tolerate any of her oral medications. She does have difficulty with childcare and has missed the last 3 episodes of dialysis.  Patient was admitted with abdominal pain with small ascites and mild suspicion for SBP.  She received hemodialysis 9/9 and currently has hypertensive crisis for which she is on Cardene drip.  Assessment & Plan:   Principal Problem:   Abdominal pain Active Problems:   Hyperkalemia   Lupus nephritis (HCC)   Benign essential HTN   HFrEF (heart failure with reduced ejection fraction) (HCC)   Hypertensive urgency   History of pulmonary embolus   Ascites   Cirrhosis of liver with ascites (HCC)  Assessment and Plan:   Abdominal pain ED PE consulted GI who has concerns about spontaneous bacterial peritonitis although this is less likely.  Continue on Rocephin for now and anticipate paracentesis in a.m. We will treat the patient's pain Zofran for nausea and vomiting Advance to soft diet today End-stage renal disease on dialysis secondary to lupus nephritis Dialysis performed 9/9 with further hemodialysis 9/11 anticipated. Hyperkalemia Currently resolved, continue to monitor Ascites Appears minimal, paracentesis attempted on 9/11 but not enough fluid present Appreciate GI evaluation with abdominal MRI ordered Hypertension with hypertensive urgency We will restart the patient's medications. Hydralazine ordered as needed, but blood  pressure still quite elevated and may require reinitiation of Cardene drip Continue home medications History of pulmonary embolism     DVT prophylaxis: Eliquis Code Status: Full Family Communication: None at bedside Disposition Plan:  Status is: Inpatient Remains inpatient appropriate because: IV medications.     Consultants:  [facility]   Procedures:  None   Antimicrobials:  Anti-infectives (From admission, onward)    Start     Dose/Rate Route Frequency Ordered Stop   08/09/22 1930  cefTRIAXone (ROCEPHIN) 2 g in sodium chloride 0.9 % 100 mL IVPB        2 g 200 mL/hr over 30 Minutes Intravenous Every 24 hours 08/09/22 1916         Subjective: Patient seen and evaluated today with ongoing abdominal pain noted, but less nausea and vomiting.  She is agreeable to having diet advanced.  Blood pressures continue to remain elevated.  Objective: Vitals:   08/11/22 1230 08/11/22 1245 08/11/22 1330 08/11/22 1406  BP: (!) 160/108 (!) 169/103 (!) 182/91 (!) 151/107  Pulse:  65 62   Resp: 16 18 16    Temp:      TempSrc:      SpO2:  97% 98%   Weight:  56 kg    Height:        Intake/Output Summary (Last 24 hours) at 08/11/2022 1448 Last data filed at 08/11/2022 1245 Gross per 24 hour  Intake 1578.24 ml  Output 4000 ml  Net -2421.76 ml   Filed Weights   08/11/22 0633 08/11/22 0855 08/11/22 1245  Weight: 59.8 kg 59.9 kg 56 kg    Examination:  General exam: Appears calm  and comfortable  Respiratory system: Clear to auscultation. Respiratory effort normal. Cardiovascular system: S1 & S2 heard, RRR.  Gastrointestinal system: Abdomen is soft, minimally tender to palpation throughout Central nervous system: Alert and awake Extremities: No edema Skin: No significant lesions noted Psychiatry: Flat affect.    Data Reviewed: I have personally reviewed following labs and imaging studies  CBC: Recent Labs  Lab 08/09/22 1220 08/10/22 0347 08/11/22 0430  WBC 5.4  5.2 3.9*  NEUTROABS 3.5  --   --   HGB 11.9* 12.2 12.1  HCT 38.0 39.2 39.9  MCV 90.0 90.1 91.9  PLT 213 204 625   Basic Metabolic Panel: Recent Labs  Lab 08/09/22 1220 08/09/22 2247 08/10/22 0347 08/11/22 0430  NA 140 136 138 137  K 6.1* 3.9 4.4 5.1  CL 106 97* 100 102  CO2 22 26 25 23   GLUCOSE 95 95 79 89  BUN 99* 42* 42* 46*  CREATININE 20.14* 11.09* 11.66* 13.69*  CALCIUM 8.2* 8.5* 7.8* 7.7*  MG  --   --   --  2.0   GFR: Estimated Creatinine Clearance: 4.9 mL/min (A) (by C-G formula based on SCr of 13.69 mg/dL (H)). Liver Function Tests: Recent Labs  Lab 08/09/22 1220 08/10/22 0347  AST 35 27  ALT 34 32  ALKPHOS 210* 208*  BILITOT 0.8 0.8  PROT 7.2 7.3  ALBUMIN 3.5 3.4*   Recent Labs  Lab 08/09/22 1220  LIPASE 52*   No results for input(s): "AMMONIA" in the last 168 hours. Coagulation Profile: No results for input(s): "INR", "PROTIME" in the last 168 hours. Cardiac Enzymes: No results for input(s): "CKTOTAL", "CKMB", "CKMBINDEX", "TROPONINI" in the last 168 hours. BNP (last 3 results) No results for input(s): "PROBNP" in the last 8760 hours. HbA1C: No results for input(s): "HGBA1C" in the last 72 hours. CBG: No results for input(s): "GLUCAP" in the last 168 hours. Lipid Profile: No results for input(s): "CHOL", "HDL", "LDLCALC", "TRIG", "CHOLHDL", "LDLDIRECT" in the last 72 hours. Thyroid Function Tests: No results for input(s): "TSH", "T4TOTAL", "FREET4", "T3FREE", "THYROIDAB" in the last 72 hours. Anemia Panel: No results for input(s): "VITAMINB12", "FOLATE", "FERRITIN", "TIBC", "IRON", "RETICCTPCT" in the last 72 hours. Sepsis Labs: No results for input(s): "PROCALCITON", "LATICACIDVEN" in the last 168 hours.  Recent Results (from the past 240 hour(s))  MRSA Next Gen by PCR, Nasal     Status: None   Collection Time: 08/09/22  8:43 PM   Specimen: Nasal Mucosa; Nasal Swab  Result Value Ref Range Status   MRSA by PCR Next Gen NOT DETECTED NOT  DETECTED Final    Comment: (NOTE) The GeneXpert MRSA Assay (FDA approved for NASAL specimens only), is one component of a comprehensive MRSA colonization surveillance program. It is not intended to diagnose MRSA infection nor to guide or monitor treatment for MRSA infections. Test performance is not FDA approved in patients less than 21 years old. Performed at [facility], [address] [place], [place] [address]          Radiology Studies: [place] ASCITES (ABDOMEN LIMITED)  Result Date: 08/11/2022 CLINICAL DATA:  End-stage renal disease, ascites EXAM: LIMITED ABDOMEN ULTRASOUND FOR ASCITES TECHNIQUE: Limited ultrasound survey for ascites was performed in all four abdominal quadrants. COMPARISON:  CT abdomen and pelvis 08/09/2022 FINDINGS: Trace ascites, insufficient for paracentesis. IMPRESSION: Trace ascites, insufficient for paracentesis. Electronically Signed   By: Lavonia Dana M.D.   On: 08/11/2022 09:25   CT ABDOMEN PELVIS W CONTRAST  Result Date: 08/09/2022 CLINICAL  DATA:  Generalized abdominal pain and nausea and vomiting for 2 days. On dialysis. EXAM: CT ABDOMEN AND PELVIS WITH CONTRAST TECHNIQUE: Multidetector CT imaging of the abdomen and pelvis was performed using the standard protocol following bolus administration of intravenous contrast. RADIATION DOSE REDUCTION: This exam was performed according to the departmental dose-optimization program which includes automated exposure control, adjustment of the mA and/or kV according to patient size and/or use of iterative reconstruction technique. CONTRAST:  160mL OMNIPAQUE IOHEXOL 300 MG/ML  SOLN COMPARISON:  Noncontrast CT on 03/17/2022 FINDINGS: Lower Chest: Stable marked cardiomegaly. Stable right basilar scarring. Hepatobiliary: Hypertrophy of caudate and left hepatic lobes is suspicious for cirrhosis. Periportal edema and enhancement is seen throughout the liver, which is nonspecific and may be due to hepatitis or hepatic congestion  syndrome. Multiple tiny sub-centimeter hypervascular foci are also seen throughout the liver, which may represent cirrhotic nodules or hepatocellular carcinoma. Diffuse gallbladder wall thickening is likely secondary to hepatocellular disease. No evidence of biliary ductal dilatation. Pancreas:  No mass or inflammatory changes. Spleen: Within normal limits in size and appearance. Adrenals/Urinary Tract: No suspicious masses identified. No evidence of ureteral calculi or hydronephrosis. Stomach/Bowel: No evidence of obstruction, inflammatory process or abnormal fluid collections. Vascular/Lymphatic: Shotty retroperitoneal lymph nodes in the left paraaortic region show no significant change. No acute vascular findings. Reproductive:  No mass or other significant abnormality. Other: Diffuse mesenteric edema and mild-to-moderate ascites is noted. Musculoskeletal:  No suspicious bone lesions identified. IMPRESSION: Diffuse periportal edema and enhancement throughout the liver is nonspecific, and may be due to hepatitis or hepatic congestion syndrome. Probable hepatic cirrhosis. Multiple tiny sub-centimeter hypervascular foci throughout the liver, which may represent cirrhotic nodules or hepatocellular carcinoma. Recommend abdomen MRI without and with contrast for further characterization. Mild-to-moderate ascites and diffuse mesenteric edema. Stable marked cardiomegaly. Electronically Signed   By: Marlaine Hind M.D.   On: 08/09/2022 15:08        Scheduled Meds:  apixaban  5 mg Oral BID   calcium acetate  1,334 mg Oral TID WC   carvedilol  25 mg Oral BID WC   Chlorhexidine Gluconate Cloth  6 each Topical Q0600   Chlorhexidine Gluconate Cloth  6 each Topical Q0600   [START ON 08/12/2022] Chlorhexidine Gluconate Cloth  6 each Topical Q0600   cloNIDine  0.2 mg Oral Q8H   doxercalciferol  2 mcg Intravenous Q M,W,F-HD   hydrALAZINE  100 mg Oral TID   losartan  100 mg Oral QHS   sodium bicarbonate  50 mEq  Intravenous Once   sodium zirconium cyclosilicate  10 g Oral Once   Continuous Infusions:  cefTRIAXone (ROCEPHIN)  IV 2 g (08/10/22 1831)   niCARDipine 3 mg/hr (08/10/22 1727)     LOS: 2 days    Time spent: 35 minutes    Brason Berthelot D Manuella Ghazi, DO [facility]  If 7PM-7AM, please contact night-coverage [email or web address] 08/11/2022, 2:48 PM

## 2022-08-11 NOTE — Consult Note (Signed)
Reason for Consult: To manage dialysis and dialysis related needs Referring Physician: [name] is an 41 y.o. female with lupus, lupus nephritis and ESRD-  noted to be non compliant with HD and has had several hospitalizations related to that.  Appears that last HD as OP was on 9/2.  She then presented to the ER on 9/9 with c/o abdominal pain/N/V.  She underwent HD on 9/9 with 3.7 liters removed-  then had to be transferred to the ICU for persistent HTN req IV meds-  cont to have abdominal pain-  GI and surgery consulted-  no ind for surgery-  might ned further imaging of liver as OP given possible cirrhosis and multiple foci in liver, ascites and diffuse mesenteric edema   Dialyzes at [facility] TTS 4 hours, profile #2  EDW 58. HD [place] 2/2, Dialyzer 180, Heparin no. Access TDC.  Hectorol 2 and mircera 200  last hgb 10.8  Past Medical History:  Diagnosis Date   Anasarca associated with disorder of kidney 06/08/2021   Anxiety    Asthma    Bipolar depression (HCC)    Depression    Dyspnea    ESRD (end stage renal disease) on dialysis ([place]) 10/2020   GERD (gastroesophageal reflux disease)    Gonorrhea    Lupus (HCC)    Pelvic inflammatory disease (PID)    Pleural effusion 06/08/2021   Pulmonary embolism ([place]) 01/31/2022   Renal hypertension    Trichomonas infection     Past Surgical History:  Procedure Laterality Date   AV FISTULA PLACEMENT Left 06/10/2022   Procedure: LEFT ARM ARTERIOVENOUS (AV) FISTULA CREATION;  Surgeon: [name], MD;  Location: [facility];  Service: Vascular;  Laterality: Left;   IR FLUORO GUIDE CV LINE RIGHT  11/30/2020   IR FLUORO GUIDE CV LINE RIGHT  06/13/2021   IR FLUORO GUIDE CV LINE RIGHT  02/01/2022   IR REMOVAL TUN CV CATH W/O FL  06/11/2021   IR REMOVAL TUN CV CATH W/O FL  01/29/2022   IR US GUIDE VASC ACCESS RIGHT  11/30/2020   IR US GUIDE VASC ACCESS RIGHT  06/13/2021   IR US GUIDE VASC ACCESS RIGHT  02/03/2022   RENAL BIOPSY     TEE WITHOUT  CARDIOVERSION N/A 06/13/2021   Procedure: TRANSESOPHAGEAL ECHOCARDIOGRAM (TEE);  Surgeon: Josue Hector, MD;  Location: [facility] ENDOSCOPY;  Service: Cardiovascular;  Laterality: N/A;   TEE WITHOUT CARDIOVERSION N/A 02/03/2022   Procedure: TRANSESOPHAGEAL ECHOCARDIOGRAM (TEE);  Surgeon: Berniece Salines, DO;  Location: [place];  Service: Cardiovascular;  Laterality: N/A;   TUBAL LIGATION N/A 02/03/2019   Procedure: POST PARTUM TUBAL LIGATION;  Surgeon: Aletha Halim, MD;  Location: [facility] LD [facility]S;  Service: Gynecology;  Laterality: N/A;    Family History  Problem Relation Age of Onset   Diabetes Maternal Grandmother    Hypertension Maternal Grandmother    Diabetes Maternal Grandfather    Hypertension Maternal Grandfather    Diabetes Paternal Grandmother    Hypertension Paternal Grandmother    Diabetes Paternal Grandfather    Hypertension Paternal Grandfather     Social History:  reports that she has been smoking cigarettes. She has never used smokeless tobacco. She reports that she does not currently use alcohol. She reports that she does not currently use drugs after having used the following drugs: Marijuana.  Allergies:  Allergies  Allergen Reactions   Oxycodone Itching    Medications: I have reviewed the patient's current medications.  Results for  orders placed or performed during the hospital encounter of 08/09/22 (from the past 48 hour(s))  CBC with Differential     Status: Abnormal   Collection Time: 08/09/22 12:20 PM  Result Value Ref Range   WBC 5.4 4.0 - 10.5 K/uL   RBC 4.22 3.87 - 5.11 MIL/uL   Hemoglobin 11.9 (L) 12.0 - 15.0 g/dL   HCT 38.0 36.0 - 46.0 %   MCV 90.0 80.0 - 100.0 fL   MCH 28.2 26.0 - 34.0 pg   MCHC 31.3 30.0 - 36.0 g/dL   RDW 21.4 (H) 11.5 - 15.5 %   Platelets 213 150 - 400 K/uL   nRBC 0.0 0.0 - 0.2 %   Neutrophils Relative % 64 %   Neutro Abs 3.5 1.7 - 7.7 K/uL   Lymphocytes Relative 20 %   Lymphs Abs 1.1 0.7 - 4.0 K/uL   Monocytes Relative 10 %    Monocytes Absolute 0.6 0.1 - 1.0 K/uL   Eosinophils Relative 4 %   Eosinophils Absolute 0.2 0.0 - 0.5 K/uL   Basophils Relative 1 %   Basophils Absolute 0.0 0.0 - 0.1 K/uL   Immature Granulocytes 1 %   Abs Immature Granulocytes 0.04 0.00 - 0.07 K/uL    Comment: Performed at [facility], [address], [place], [place] [address]  Comprehensive metabolic panel     Status: Abnormal   Collection Time: 08/09/22 12:20 PM  Result Value Ref Range   Sodium 140 135 - 145 mmol/L   Potassium 6.1 (H) 3.5 - 5.1 mmol/L   Chloride 106 98 - 111 mmol/L   CO2 22 22 - 32 mmol/L   Glucose, Bld 95 70 - 99 mg/dL    Comment: Glucose reference range applies only to samples taken after fasting for at least 8 hours.   BUN 99 (H) 6 - 20 mg/dL   Creatinine, Ser 20.14 (H) 0.44 - 1.00 mg/dL   Calcium 8.2 (L) 8.9 - 10.3 mg/dL   Total Protein 7.2 6.5 - 8.1 g/dL   Albumin 3.5 3.5 - 5.0 g/dL   AST 35 15 - 41 U/L   ALT 34 0 - 44 U/L   Alkaline Phosphatase 210 (H) 38 - 126 U/L   Total Bilirubin 0.8 0.3 - 1.2 mg/dL   GFR, Estimated 2 (L) >60 mL/min    Comment: (NOTE) Calculated using the CKD-EPI Creatinine Equation (2021)    Anion gap 12 5 - 15    Comment: Performed at [facility], [address] [place], [place] [address]  Lipase, blood     Status: Abnormal   Collection Time: 08/09/22 12:20 PM  Result Value Ref Range   Lipase 52 (H) 11 - 51 U/L    Comment: Performed at [facility], [address] [place], [place] [address]  MRSA Next Gen by PCR, Nasal     Status: None   Collection Time: 08/09/22  8:43 PM   Specimen: Nasal Mucosa; Nasal Swab  Result Value Ref Range   MRSA by PCR Next Gen NOT DETECTED NOT DETECTED    Comment: (NOTE) The GeneXpert MRSA Assay (FDA approved for NASAL specimens only), is one component of a comprehensive MRSA colonization surveillance program. It is not intended to diagnose MRSA infection nor to guide or monitor treatment for MRSA infections. Test performance is not FDA  approved in patients less than 29 years old. Performed at [facility], [address] [place], [place] [address]   Basic metabolic panel     Status:  Abnormal   Collection Time: 08/09/22 10:47 PM  Result Value Ref Range   Sodium 136 135 - 145 mmol/L   Potassium 3.9 3.5 - 5.1 mmol/L   Chloride 97 (L) 98 - 111 mmol/L   CO2 26 22 - 32 mmol/L   Glucose, Bld 95 70 - 99 mg/dL    Comment: Glucose reference range applies only to samples taken after fasting for at least 8 hours.   BUN 42 (H) 6 - 20 mg/dL   Creatinine, Ser 11.09 (H) 0.44 - 1.00 mg/dL   Calcium 8.5 (L) 8.9 - 10.3 mg/dL   GFR, Estimated 4 (L) >60 mL/min    Comment: (NOTE) Calculated using the CKD-EPI Creatinine Equation (2021)    Anion gap 13 5 - 15    Comment: Performed at [facility], [address] [place], [place] [address]  CBC     Status: Abnormal   Collection Time: 08/10/22  3:47 AM  Result Value Ref Range   WBC 5.2 4.0 - 10.5 K/uL   RBC 4.35 3.87 - 5.11 MIL/uL   Hemoglobin 12.2 12.0 - 15.0 g/dL   HCT 39.2 36.0 - 46.0 %   MCV 90.1 80.0 - 100.0 fL   MCH 28.0 26.0 - 34.0 pg   MCHC 31.1 30.0 - 36.0 g/dL   RDW 21.6 (H) 11.5 - 15.5 %   Platelets 204 150 - 400 K/uL   nRBC 0.0 0.0 - 0.2 %    Comment: Performed at [facility], [address] [place], [place] [address]  Comprehensive metabolic panel     Status: Abnormal   Collection Time: 08/10/22  3:47 AM  Result Value Ref Range   Sodium 138 135 - 145 mmol/L   Potassium 4.4 3.5 - 5.1 mmol/L   Chloride 100 98 - 111 mmol/L   CO2 25 22 - 32 mmol/L   Glucose, Bld 79 70 - 99 mg/dL    Comment: Glucose reference range applies only to samples taken after fasting for at least 8 hours.   BUN 42 (H) 6 - 20 mg/dL   Creatinine, Ser 11.66 (H) 0.44 - 1.00 mg/dL   Calcium 7.8 (L) 8.9 - 10.3 mg/dL   Total Protein 7.3 6.5 - 8.1 g/dL   Albumin 3.4 (L) 3.5 - 5.0 g/dL   AST 27 15 - 41 U/L   ALT 32 0 - 44 U/L   Alkaline Phosphatase 208 (H) 38 - 126 U/L   Total Bilirubin 0.8 0.3 -  1.2 mg/dL   GFR, Estimated 4 (L) >60 mL/min    Comment: (NOTE) Calculated using the CKD-EPI Creatinine Equation (2021)    Anion gap 13 5 - 15    Comment: Performed at [facility], [address], [place],  [address]  Urinalysis, Routine w reflex microscopic Urine, Clean Catch     Status: Abnormal   Collection Time: 08/10/22  7:58 AM  Result Value Ref Range   Color, Urine YELLOW YELLOW   APPearance CLEAR CLEAR   Specific Gravity, Urine 1.016 1.005 - 1.030   pH 7.0 5.0 - 8.0   Glucose, UA NEGATIVE NEGATIVE mg/dL   Hgb urine dipstick NEGATIVE NEGATIVE   Bilirubin Urine NEGATIVE NEGATIVE   Ketones, ur NEGATIVE NEGATIVE mg/dL   Protein, ur >=300 (A) NEGATIVE mg/dL   Nitrite NEGATIVE NEGATIVE   Leukocytes,Ua TRACE (A) NEGATIVE   RBC / HPF 6-10 0 - 5 RBC/hpf   WBC, UA 11-20 0 - 5 WBC/hpf   Bacteria, UA RARE (A) NONE SEEN  Squamous Epithelial / LPF 0-5 0 - 5    Comment: Performed at [facility], [address] [place], [place] [address]  Basic metabolic panel     Status: Abnormal   Collection Time: 08/11/22  4:30 AM  Result Value Ref Range   Sodium 137 135 - 145 mmol/L   Potassium 5.1 3.5 - 5.1 mmol/L   Chloride 102 98 - 111 mmol/L   CO2 23 22 - 32 mmol/L   Glucose, Bld 89 70 - 99 mg/dL    Comment: Glucose reference range applies only to samples taken after fasting for at least 8 hours.   BUN 46 (H) 6 - 20 mg/dL   Creatinine, Ser 13.69 (H) 0.44 - 1.00 mg/dL   Calcium 7.7 (L) 8.9 - 10.3 mg/dL   GFR, Estimated 3 (L) >60 mL/min    Comment: (NOTE) Calculated using the CKD-EPI Creatinine Equation (2021)    Anion gap 12 5 - 15    Comment: Performed at [facility], [address] [place], [place] [address]  Magnesium     Status: None   Collection Time: 08/11/22  4:30 AM  Result Value Ref Range   Magnesium 2.0 1.7 - 2.4 mg/dL    Comment: Performed at [facility], [address] [place], [place] [address]  CBC     Status: Abnormal   Collection Time: 08/11/22  4:30 AM   Result Value Ref Range   WBC 3.9 (L) 4.0 - 10.5 K/uL   RBC 4.34 3.87 - 5.11 MIL/uL   Hemoglobin 12.1 12.0 - 15.0 g/dL   HCT 39.9 36.0 - 46.0 %   MCV 91.9 80.0 - 100.0 fL   MCH 27.9 26.0 - 34.0 pg   MCHC 30.3 30.0 - 36.0 g/dL   RDW 21.1 (H) 11.5 - 15.5 %   Platelets 151 150 - 400 K/uL   nRBC 0.0 0.0 - 0.2 %    Comment: Performed at [facility], [address] [place], [place] [address]    CT ABDOMEN PELVIS W CONTRAST  Result Date: 08/09/2022 CLINICAL DATA:  Generalized abdominal pain and nausea and vomiting for 2 days. On dialysis. EXAM: CT ABDOMEN AND PELVIS WITH CONTRAST TECHNIQUE: Multidetector CT imaging of the abdomen and pelvis was performed using the standard protocol following bolus administration of intravenous contrast. RADIATION DOSE REDUCTION: This exam was performed according to the departmental dose-optimization program which includes automated exposure control, adjustment of the mA and/or kV according to patient size and/or use of iterative reconstruction technique. CONTRAST:  117mL OMNIPAQUE IOHEXOL 300 MG/ML  SOLN COMPARISON:  Noncontrast CT on 03/17/2022 FINDINGS: Lower Chest: Stable marked cardiomegaly. Stable right basilar scarring. Hepatobiliary: Hypertrophy of caudate and left hepatic lobes is suspicious for cirrhosis. Periportal edema and enhancement is seen throughout the liver, which is nonspecific and may be due to hepatitis or hepatic congestion syndrome. Multiple tiny sub-centimeter hypervascular foci are also seen throughout the liver, which may represent cirrhotic nodules or hepatocellular carcinoma. Diffuse gallbladder wall thickening is likely secondary to hepatocellular disease. No evidence of biliary ductal dilatation. Pancreas:  No mass or inflammatory changes. Spleen: Within normal limits in size and appearance. Adrenals/Urinary Tract: No suspicious masses identified. No evidence of ureteral calculi or hydronephrosis. Stomach/Bowel: No evidence of obstruction,  inflammatory process or abnormal fluid collections. Vascular/Lymphatic: Shotty retroperitoneal lymph nodes in the left paraaortic region show no significant change. No acute vascular findings. Reproductive:  No mass or other significant abnormality. Other: Diffuse mesenteric edema and mild-to-moderate ascites is noted. Musculoskeletal:  No suspicious bone lesions identified. IMPRESSION: Diffuse periportal edema and enhancement throughout the liver is nonspecific, and may be due to hepatitis or hepatic congestion syndrome. Probable hepatic cirrhosis. Multiple tiny sub-centimeter hypervascular foci throughout the liver, which may represent cirrhotic nodules or hepatocellular carcinoma. Recommend abdomen MRI without and with contrast for further characterization. Mild-to-moderate ascites and diffuse mesenteric edema. Stable marked cardiomegaly. Electronically Signed   By: Marlaine Hind M.D.   On: 08/09/2022 15:08    ROS: high BP, occasional sharp pains in belly, distended belly  Blood pressure (!) 144/96, pulse (!) 56, temperature 98.2 F (36.8 C), temperature source Oral, resp. rate 11, height 5\' 7"  (1.702 m), weight 59.8 kg, SpO2 97 %, unknown if currently breastfeeding. General appearance: alert and no distress Resp: clear to auscultation bilaterally Cardio: regular rate and rhythm, S1, S2 normal, no murmur, click, rub or gallop GI: abnormal findings:  distended Extremities: extremities normal, atraumatic, no cyanosis or edema TDC and left arm AVF-  good thrill and bruit  placed 7/11  Assessment/Plan: 41 year old BF with ESRD and noncompliance presenting with abdominal pain and high BP-  volume overloaded on imaging  1 abdominal pain-  large work up-  is on rocephin for possible SBP.  For paracentesis today ( but actually no fluid to give) GI involved-  severity of pain seems to be less 2 ESRD: normally TTS-  non compliant-  plan on HD today off schedule then likely will plan again for tomorrow to get  back on schedule-  possibly as OP ?? 3 Hypertension: BP high as OP suspect noncom with BP meds  and fluid-  max UF today -  is still on cardene should be able to stop-  also on coreg/hydra;/clonidine/ and cozaar-  should get BP down today  4. Anemia of ESRD: not an issue right now, no ESA needed  5. Metabolic Bone Disease: cont phoslo and hectorol  6. Dispo-  if BP down after HD I would not be opposed to discharge so she could go to OP unit tomorrow    Louis Meckel 08/11/2022, 7:58 AM

## 2022-08-11 NOTE — Progress Notes (Signed)
Dr Manuella Ghazi has been updated and made aware of patient blood pressures throughout the day. PRN ordered and given and IV Cardene restarted. Dr Manuella Ghazi aware. Patient denied any chest pain, discomfort or any shortness of breath.

## 2022-08-11 NOTE — TOC Initial Note (Signed)
Transition of Care [facility]) - Initial/Assessment Note    Patient Details  Name: Cathy [name] MRN: [id number] Date of Birth: 02-10-1981  [facility] CM/SW Contact:    Iona Beard, [place] Phone Number: 08/11/2022, 12:51 PM  Clinical Narrative:                 Pt is high risk for readmission. CSW spoke with pt to complete assessment. Pt states that she lives at home with her three children. Pt states that she is able to complete her ADLs and drives when needed. Pt has not had HH in the past and she had home O2. Pt has HD at [facility].   Expected Discharge Plan: Home/Self Care Barriers to Discharge: Continued Medical Work up   Patient Goals and CMS Choice Patient states their goals for this hospitalization and ongoing recovery are:: return home CMS Medicare.gov Compare Post Acute Care list provided to:: Patient Choice offered to / list presented to : Patient  Expected Discharge Plan and Services Expected Discharge Plan: Home/Self Care In-house Referral: Clinical Social Work Discharge Planning Services: CM Consult   Living arrangements for the past 2 months: Apartment                                      Prior Living Arrangements/Services Living arrangements for the past 2 months: Apartment Lives with:: Minor Children Patient language and need for interpreter reviewed:: Yes Do you feel safe going back to the place where you live?: Yes      Need for Family Participation in Patient Care: Yes (Comment) Care giver support system in place?: Yes (comment) Current home services: DME (Home O2) Criminal Activity/Legal Involvement Pertinent to Current Situation/Hospitalization: No - Comment as needed  Activities of Daily Living Home Assistive Devices/Equipment: None ADL Screening (condition at time of admission) Patient's cognitive ability adequate to safely complete daily activities?: Yes Is the patient deaf or have difficulty hearing?:  No Does the patient have difficulty seeing, even when wearing glasses/contacts?: No Does the patient have difficulty concentrating, remembering, or making decisions?: No Patient able to express need for assistance with ADLs?: Yes Does the patient have difficulty dressing or bathing?: No Independently performs ADLs?: Yes (appropriate for developmental age) Does the patient have difficulty walking or climbing stairs?: No Weakness of Legs: None Weakness of Arms/Hands: None  Permission Sought/Granted                  Emotional Assessment Appearance:: Appears stated age Attitude/Demeanor/Rapport: Engaged Affect (typically observed): Accepting Orientation: : Oriented to Self, Oriented to Place, Oriented to  Time, Oriented to Situation Alcohol / Substance Use: Not Applicable Psych Involvement: No (comment)  Admission diagnosis:  Hyperkalemia [E87.5] Generalized abdominal pain [R10.84] Other ascites [R18.8] ESRD on dialysis ([place]) [N18.6, Z99.2] Hypertensive urgency [I16.0] Abdominal pain [R10.9] Patient Active Problem List   Diagnosis Date Noted   Acute hyperkalemia 06/06/2022   Pruritus 03/18/2022   Abdominal pain 03/17/2022   Ascites 03/02/2022   Volume overload 03/01/2022   MRSA bacteremia 01/29/2022   Tobacco dependence 01/29/2022   History of pulmonary embolus 01/07/2022   Malignant hypertension 01/06/2022   Hypertensive urgency 01/06/2022   Anemia of chronic kidney failure 12/28/2021   Chronic combined systolic and diastolic congestive heart failure ([place]) 12/27/2021   CAP (community acquired pneumonia) 12/15/2021   Benign essential HTN 06/08/2021   HFrEF (heart failure with reduced  ejection fraction) ([place])    Encounter for antineoplastic chemotherapy 01/07/2021   ESRD on hemodialysis ([place]) 12/23/2020   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 AT [facility] [address] [place] [address] Phone: [phone] Fax: [phone]     Social Determinants of Health (SDOH) Interventions Housing Interventions: Other (Comment) (Patient says she doesn't need help currently her housing is good now.)  Readmission Risk Interventions    08/11/2022   12:50 PM 03/18/2022    9:17 AM 03/05/2022    4:42 PM  Readmission Risk Prevention Plan  Transportation Screening Complete Complete Complete  Medication Review ([place]) Complete Complete Complete  PCP or Specialist appointment within 3-5 days of discharge   Complete  HRI or [place] Complete Complete Complete  SW Recovery Care/Counseling Consult Complete Complete Complete  Palliative Care Screening Not Applicable Not Applicable Not [place] Not Applicable Not Applicable Not Applicable

## 2022-08-11 NOTE — Procedures (Signed)
   HEMODIALYSIS TREATMENT NOTE:   Extra treatment ordered for hypertension and volume overload.  Cardene infusion was stopped just prior to start of HD with a 4L goal.  Hypertension persisted.  Scheduled po hydralazine was given mid-treatment. 3.5 hour session completed.  Goal met; 4 liters removed.  All blood was returned.  Meds given: Hectorol 2 mcg  Post treatment: 169/103, map104, p68, r18, spO2 98% on RA.  Hand-off to Jeanmarie Plant, Therapist, sports.     Rockwell Alexandria, RN

## 2022-08-12 ENCOUNTER — Other Ambulatory Visit: Payer: Self-pay | Admitting: *Deleted

## 2022-08-12 ENCOUNTER — Telehealth: Payer: Self-pay | Admitting: Gastroenterology

## 2022-08-12 ENCOUNTER — Other Ambulatory Visit: Payer: Self-pay | Admitting: Gastroenterology

## 2022-08-12 DIAGNOSIS — R932 Abnormal findings on diagnostic imaging of liver and biliary tract: Secondary | ICD-10-CM

## 2022-08-12 DIAGNOSIS — K219 Gastro-esophageal reflux disease without esophagitis: Secondary | ICD-10-CM

## 2022-08-12 DIAGNOSIS — R1310 Dysphagia, unspecified: Secondary | ICD-10-CM

## 2022-08-12 DIAGNOSIS — R1010 Upper abdominal pain, unspecified: Secondary | ICD-10-CM

## 2022-08-12 DIAGNOSIS — R7989 Other specified abnormal findings of blood chemistry: Secondary | ICD-10-CM

## 2022-08-12 DIAGNOSIS — Z23 Encounter for immunization: Secondary | ICD-10-CM | POA: Diagnosis present

## 2022-08-12 DIAGNOSIS — R1084 Generalized abdominal pain: Secondary | ICD-10-CM | POA: Diagnosis not present

## 2022-08-12 LAB — BASIC METABOLIC PANEL
Anion gap: 9 (ref 5–15)
BUN: 27 mg/dL — ABNORMAL HIGH (ref 6–20)
CO2: 27 mmol/L (ref 22–32)
Calcium: 8.3 mg/dL — ABNORMAL LOW (ref 8.9–10.3)
Chloride: 98 mmol/L (ref 98–111)
Creatinine, Ser: 9.28 mg/dL — ABNORMAL HIGH (ref 0.44–1.00)
GFR, Estimated: 5 mL/min — ABNORMAL LOW (ref >60)
Glucose, Bld: 94 mg/dL (ref 70–99)
Potassium: 4.3 mmol/L (ref 3.5–5.1)
Sodium: 134 mmol/L — ABNORMAL LOW (ref 135–145)

## 2022-08-12 LAB — HEPATITIS B SURFACE ANTIBODY, QUANTITATIVE: Hep B S AB Quant (Post): 143.3 m[IU]/mL (ref >9.9)

## 2022-08-12 LAB — CBC
HCT: 37.6 % (ref 36.0–46.0)
Hemoglobin: 11.7 g/dL — ABNORMAL LOW (ref 12.0–15.0)
MCH: 28.3 pg (ref 26.0–34.0)
MCHC: 31.1 g/dL (ref 30.0–36.0)
MCV: 91 fL (ref 80.0–100.0)
Platelets: 137 10*3/uL — ABNORMAL LOW (ref 150–400)
RBC: 4.13 MIL/uL (ref 3.87–5.11)
RDW: 20.5 % — ABNORMAL HIGH (ref 11.5–15.5)
WBC: 4.2 10*3/uL (ref 4.0–10.5)
nRBC: 0 % (ref 0.0–0.2)

## 2022-08-12 LAB — MAGNESIUM: Magnesium: 2 mg/dL (ref 1.7–2.4)

## 2022-08-12 LAB — HEPATITIS C ANTIBODY: HCV Ab: NONREACTIVE — AB

## 2022-08-12 LAB — AFP TUMOR MARKER: AFP, Serum, Tumor Marker: 3.8 ng/mL (ref 0.0–6.4)

## 2022-08-12 MED ORDER — APIXABAN 5 MG PO TABS
5.0000 mg | ORAL_TABLET | Freq: Every day | ORAL | 0 refills | Status: DC
Start: 2022-08-12 — End: 2022-10-27

## 2022-08-12 MED ORDER — AMLODIPINE BESYLATE 5 MG PO TABS
5.0000 mg | ORAL_TABLET | Freq: Every day | ORAL | Status: DC
  Administered 2022-08-12: 5 mg via ORAL
  Filled 2022-08-12: qty 1

## 2022-08-12 MED ORDER — PANTOPRAZOLE SODIUM 40 MG PO TBEC
40.0000 mg | DELAYED_RELEASE_TABLET | Freq: Every day | ORAL | 0 refills | Status: DC
Start: 2022-08-12 — End: 2022-10-27

## 2022-08-12 MED ORDER — HEPARIN SODIUM (PORCINE) 1000 UNIT/ML IJ SOLN
INTRAMUSCULAR | Status: AC
  Administered 2022-08-12: 3200 [IU]
  Filled 2022-08-12: qty 8

## 2022-08-12 MED ORDER — AMLODIPINE BESYLATE 5 MG PO TABS: 5.0000 mg | ORAL_TABLET | Freq: Every day | ORAL | 0 refills | Status: DC

## 2022-08-12 MED ORDER — DOXERCALCIFEROL 4 MCG/2ML IV SOLN
2.0000 ug | INTRAVENOUS | Status: DC
Start: 2022-08-14 — End: 2022-08-12

## 2022-08-12 MED ORDER — DIPHENHYDRAMINE HCL 50 MG/ML IJ SOLN
25.0000 mg | Freq: Once | INTRAMUSCULAR | Status: AC
  Administered 2022-08-12: 25 mg via INTRAVENOUS
  Filled 2022-08-12: qty 1

## 2022-08-12 MED ORDER — PANTOPRAZOLE SODIUM 40 MG PO TBEC
40.0000 mg | DELAYED_RELEASE_TABLET | Freq: Every day | ORAL | Status: DC
  Administered 2022-08-12: 40 mg via ORAL
  Filled 2022-08-12: qty 1

## 2022-08-12 NOTE — Progress Notes (Signed)
error 

## 2022-08-12 NOTE — Progress Notes (Signed)
Subjective: Upper abdominal pain has improved since admission, but still present.  Primarily epigastric and RUQ region.  No postprandial pain.  Tolerated soft diet this morning well.  She does have mild nausea, but no vomiting. Reports intermittent nausea/vomiting has been a chronic problem for her, usually occurs on the days she doesn't have dialysis. Has chronic reflux several days a week taking tums. Also with new onset dysphagia starting March this year with a total of 4 episodes. No regurgitation. No brbpr or melena. Last BM was yesterday.   No Fhx of hemochromatosis.  No oral iron supplementation.  Think she has received IV iron with dialysis, but cannot be sure of last dose.  Per dialysis summary, does not appear she is receiving IV iron at this time.  No NSAIDs.  Currently undergoing dialysis at bedside.  Objective: Vital signs in last 24 hours: Temp:  [98 F (36.7 C)-99.4 F (37.4 C)] 98.1 F (36.7 C) (09/12 0915) Pulse Rate:  [52-105] 58 (09/12 1130) Resp:  [9-25] 20 (09/12 1130) BP: (115-189)/(63-121) 163/96 (09/12 1130) SpO2:  [92 %-100 %] 100 % (09/12 1100) Weight:  [56 kg-57.7 kg] 57.6 kg (09/12 0915) Last BM Date : 08/11/22 General:   Alert and oriented, pleasant Head:  Normocephalic and atraumatic. Abdomen:  Bowel sounds present, soft, non-distended.  Moderate TTP in epigastric and RUQ region.  No rebound.  No masses appreciated  Msk:  Symmetrical without gross deformities. Normal posture. Extremities:  Without edema. Neurologic:  Alert and  oriented x4;  grossly normal neurologically. Psych:  Normal mood and affect.  Intake/Output from previous day: 09/11 0701 - 09/12 0700 In: 506.1 [I.V.:406.1; IV Piggyback:100] Out: 4000  Intake/Output this shift: Total I/O In: 48.4 [I.V.:48.4] Out: -   Lab Results: Recent Labs    08/10/22 0347 08/11/22 0430 08/12/22 0504  WBC 5.2 3.9* 4.2  HGB 12.2 12.1 11.7*  HCT 39.2 39.9 37.6  PLT 204 151 137*    BMET Recent Labs    08/10/22 0347 08/11/22 0430 08/12/22 0504  NA 138 137 134*  K 4.4 5.1 4.3  CL 100 102 98  CO2 25 23 27   GLUCOSE 79 89 94  BUN 42* 46* 27*  CREATININE 11.66* 13.69* 9.28*  CALCIUM 7.8* 7.7* 8.3*   LFT Recent Labs    08/09/22 1220 08/10/22 0347  PROT 7.2 7.3  ALBUMIN 3.5 3.4*  AST 35 27  ALT 34 32  ALKPHOS 210* 208*  BILITOT 0.8 0.8   PT/INR No results for input(s): "LABPROT", "INR" in the last 72 hours. Hepatitis Panel Recent Labs    08/09/22 1645  HCVAB Non Reactive*     Studies/Results: MR ABDOMEN MRCP WO CONTRAST  Result Date: 08/11/2022 CLINICAL DATA:  Abnormal liver on CT EXAM: MRI ABDOMEN WITHOUT CONTRAST  (INCLUDING MRCP) TECHNIQUE: Multiplanar multisequence MR imaging of the abdomen was performed. Heavily T2-weighted images of the biliary and pancreatic ducts were obtained, and three-dimensional MRCP images were rendered by post processing. COMPARISON:  CT abdomen/pelvis dated 08/09/2022 FINDINGS: Lower chest: Stable opacity along the lateral right lung base (series 4/image 4), presumably post infectious/inflammatory scarring. Cardiomegaly. Hepatobiliary: Low T2 signal with signal loss on in-phase imaging, suggesting iron deposition in the liver. No focal hepatic lesion is seen. No restricted diffusion. Intravenous contrast not administered. Gallbladder is unremarkable. No intrahepatic or extrahepatic ductal dilatation. Pancreas: 7 mm unilocular cyst in the pancreatic head (series 4/image 33). No pancreatic ductal dilatation. Spleen: Spleen is normal in size. Signal loss on in-phase  imaging, suggesting iron deposition. Adrenals/Urinary Tract:  Adrenal glands are within normal limits. Kidneys are within normal limits.  No hydronephrosis. Stomach/Bowel: Stomach is within normal limits. Visualized bowel is grossly unremarkable. Vascular/Lymphatic:  No evidence of aneurysm. No suspicious abdominal lymphadenopathy. Other:  No abdominal ascites.  Musculoskeletal: No focal osseous lesions. IMPRESSION: Limited evaluation due to lack of intravenous contrast administration. However, no focal hepatic lesion is seen. The appearance of the liver on CT may reflect congestive hepatopathy due to cardiac dysfunction. Iron deposition in the liver and spleen. 7 mm unilocular cyst in the pancreatic head, likely benign. Follow-up MRI abdomen is suggested in 2 years, if clinically warranted. Electronically Signed   By: Julian Hy M.D.   On: 08/11/2022 19:58   [place] ASCITES (ABDOMEN LIMITED)  Result Date: 08/11/2022 CLINICAL DATA:  End-stage renal disease, ascites EXAM: LIMITED ABDOMEN ULTRASOUND FOR ASCITES TECHNIQUE: Limited ultrasound survey for ascites was performed in all four abdominal quadrants. COMPARISON:  CT abdomen and pelvis 08/09/2022 FINDINGS: Trace ascites, insufficient for paracentesis. IMPRESSION: Trace ascites, insufficient for paracentesis. Electronically Signed   By: Lavonia Dana M.D.   On: 08/11/2022 09:25    Assessment: 41 y.o. female with a history of bipolar disorder, depression, end-stage renal disease on dialysis, lupus, pulmonary embolism, heart failure with reduced EF, anxiety, who came to the hospital after presenting worsening abdominal pain, distension, N/V after missing several episodes of dialysis.   CT abd/pelvis with contrast showed non-specific diffuse periportal edema and enhancement throughout liver, probable hepatic cirrhosis, multiple tiny sub-centimeter hypervascular foci throughout liver, differentials including cirrhotic nodules or HCC. MRI recommended. Mild to moderate ascites and diffuse mesenteric edema also noted. Notably, prior CTs as recent as April 2023 showed no focal liver abnormalities.   Attempted paracentesis, but insufficient fluid present, thus doubt SBP.  MRI/MRCP abdomen without contrast completed yesterday with suggestion of iron deposition in the liver and spleen, no focal hepatic lesion, no biliary  abnormalities.  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  Also with 7 mm unilocular cyst in the pancreatic head, likely benign with recommendations for follow-up MRI in 2 years. AFP was wnl.   Clinically, she has improved from admission, but continues with epigastric and RUQ abdominal pain, nausea, but no vomiting.  She is tolerating a soft diet and reports no postprandial symptoms. On exam, she has moderate TTP in the epigastric and RUQ region. Suspect flare of symptoms was likely secondary to uremia; however, additional differentials include gastritis, duodenitis, PUD, uncontrolled chronic GERD. Denies NSAIDs. Will start PPI daily. Consider EGD in outpatient setting.   GERD:  Chronic. Uncontrolled using tums several days a week. Will start PPI daily.   Dysphagia:  Reports 4 episodes of dysphagia since March 2023. This is in the setting of uncontrolled GERD and may be secondary to GERD, esophagitis, esophageal web, ring, or stricture. She is currently tolerating a soft diet well. I am starting her on PPI daily. She should have EGD at some point in the near future, but this can be completed outpatient as she is tolerating her diet.   Iron deposition of liver and spleen:  History of elevated ferritin, most recent ferritin 987 in February, but iron and saturation wnl. Patient denies oral iron supplementation, but thinks she has received IV iron at the time of dialysis in the past; however, this isn't listed on her current dialysis regimen. No Fhx of hemochromatosis. Will recheck iron panel and Hemochromatosis DNA. Can consider hematology referral  outpatient for monitoring.   Isolated elevated alk phos:  Chronic. Possibly related to hepatic congestion in setting of known history of HF and reduced EF. Could pursue further evaluation as outpatient.    Plan: Protonix 40 mg daily. Continue soft diet.  Check iron panel and hemochromatosis DNA. MRI with  pancreatic protocol in 2 years.  Follow-up in clinic in 4 weeks. Consider EGD at that time if ongoing upper abdominal pain and dysphagia.    LOS: 3 days    08/12/2022, 11:48 AM   Aliene Altes, PA-C [facility]

## 2022-08-12 NOTE — Telephone Encounter (Signed)
Mandy:  Please arrange hospital follow-up in about 4 weeks.   Courtney:  Please arrange for iron panel with ferritin and hemochromatosis DNA. Dx: Abnormal MRI liver, abnormal MRI spleen, elevated ferritin.

## 2022-08-12 NOTE — Progress Notes (Signed)
Nsg Discharge Note  Admit Date:  08/09/2022 Discharge date: 08/12/2022   Cathy [name] to be D/C'd Home per MD order.  AVS completed.  Copy for chart, and copy for patient signed, and dated. Patient/caregiver able to verbalize understanding.  Discharge Medication: Allergies as of 08/12/2022       Reactions   Oxycodone Itching        Medication List     TAKE these medications    acetaminophen 500 MG tablet Commonly known as: TYLENOL Take 1,000 mg by mouth every 6 (six) hours as needed for moderate pain.   albuterol 108 (90 Base) MCG/ACT inhaler Commonly known as: VENTOLIN HFA Inhale 2 puffs into the lungs every 6 (six) hours as needed for wheezing or shortness of breath.   amLODipine 5 MG tablet Commonly known as: NORVASC Take 1 tablet (5 mg total) by mouth daily. Start taking on: August 13, 2022   apixaban 5 MG Tabs tablet Commonly known as: ELIQUIS Take 1 tablet (5 mg total) by mouth daily.   calcium acetate 667 MG capsule Commonly known as: PHOSLO Take 2 capsules (1,334 mg total) by mouth 3 (three) times daily with meals.   carvedilol 25 MG tablet Commonly known as: COREG Take 1 tablet (25 mg total) by mouth 2 (two) times daily with a meal.   cloNIDine 0.2 MG tablet Commonly known as: CATAPRES Take 1 tablet (0.2 mg total) by mouth every 8 (eight) hours.   Dialyvite 800 0.8 MG Tabs Take 1 tablet by mouth daily.   HECTOROL IV Doxercalciferol (Hectorol)   hydrALAZINE 100 MG tablet Commonly known as: APRESOLINE Take 1 tablet (100 mg total) by mouth 3 (three) times daily.   ipratropium 17 MCG/ACT inhaler Commonly known as: ATROVENT HFA Inhale 2 puffs into the lungs every 6 (six) hours as needed for wheezing. What changed: when to take this   losartan 100 MG tablet Commonly known as: COZAAR Take 1 tablet (100 mg total) by mouth at bedtime.   Omron 3 Series BP Monitor Devi Use as directed 2 times a day   pantoprazole 40 MG tablet Commonly known  as: PROTONIX Take 1 tablet (40 mg total) by mouth daily before breakfast.        Discharge Assessment: Vitals:   08/12/22 1530 08/12/22 1600  BP:  133/74  Pulse: (!) 58 (!) 56  Resp: 17 (!) 30  Temp:    SpO2: 100% 96%   Skin clean, dry and intact without evidence of skin break down, no evidence of skin tears noted. IV catheter discontinued intact. Site without signs and symptoms of complications - no redness or edema noted at insertion site, patient denies c/o pain - only slight tenderness at site.  Dressing with slight pressure applied.  D/c Instructions-Education: Discharge instructions given to patient/family with verbalized understanding. D/c education completed with patient/family including follow up instructions, medication list, d/c activities limitations if indicated, with other d/c instructions as indicated by MD - patient able to verbalize understanding, all questions fully answered. Patient instructed to return to ED, call 911, or call MD for any changes in condition.  Patient escorted via [place], and D/C home via private auto.  Carney Corners, RN 08/12/2022 4:44 PM

## 2022-08-12 NOTE — Telephone Encounter (Signed)
LMOM for pt to call office  

## 2022-08-12 NOTE — Discharge Summary (Signed)
Physician Discharge Summary  Cathy [name] OIN:[id number] DOB: Nov 01, 1981 DOA: 08/09/2022  PCP: Pcp, No  Admit date: 08/09/2022  Discharge date: 08/12/2022  Admitted From:Home  Disposition:  Home  Recommendations for Outpatient Follow-up:  Follow up with PCP in 1-2 weeks Follow-up with GI in 4 weeks which will be scheduled, remain on Protonix daily Amlodipine 5 mg daily added to hypertension regimen Continue hemodialysis per usual routine Tuesday, Thursday, Saturday  Home Health: None  Equipment/Devices: None  Discharge Condition:Stable  CODE STATUS: Full  Diet recommendation: Heart Healthy  Brief/Interim Summary: Cathy [name] is a 41 y.o. female with medical history significant of end-stage renal disease on dialysis, lupus with lupus nephritis.  Patient presents with generalized stabbing periumbilical abdominal pain that started 2 days ago with nausea and vomiting and distention.  She has not been able to tolerate oral food or liquid for 24 hours.  She denies fevers, chills, nausea, vomiting.  She has not been able to tolerate any of her oral medications. She does have difficulty with childcare and has missed the last 3 episodes of dialysis.  Patient was admitted with abdominal pain with small ascites and mild suspicion for SBP and therefore she was empirically started on Rocephin.  She did not have any fluid to drain out for paracentesis and according to GI, it was reasonable to discontinue further Rocephin as it appears that she likely did not have SBP and more likely had fluid accumulation and bowel edema from missed hemodialysis sessions.  Her blood pressures were better controlled on IV Cardene during the course of this admission and she is now in stable condition for discharge on her usual home blood pressure agents with the addition of amlodipine.  She will follow-up with GI outpatient to consider EGD and do her best to attend her usual hemodialysis sessions.  Discharge  Diagnoses:  Principal Problem:   Abdominal pain Active Problems:   Hyperkalemia   Lupus nephritis (HCC)   Benign essential HTN   HFrEF (heart failure with reduced ejection fraction) (HCC)   Hypertensive urgency   History of pulmonary embolus   Ascites   Cirrhosis of liver with ascites ([place])  Principal discharge diagnosis: Abdominal pain and dysphagia likely in the setting of bowel edema/GERD with plans for outpatient work-up.  Hypertensive crisis in patient with ESRD/HD  Discharge Instructions  Discharge Instructions     Diet - low sodium heart healthy   Complete by: As directed    Increase activity slowly   Complete by: As directed    No wound care   Complete by: As directed       Allergies as of 08/12/2022       Reactions   Oxycodone Itching        Medication List     TAKE these medications    acetaminophen 500 MG tablet Commonly known as: TYLENOL Take 1,000 mg by mouth every 6 (six) hours as needed for moderate pain.   albuterol 108 (90 Base) MCG/ACT inhaler Commonly known as: VENTOLIN HFA Inhale 2 puffs into the lungs every 6 (six) hours as needed for wheezing or shortness of breath.   amLODipine 5 MG tablet Commonly known as: NORVASC Take 1 tablet (5 mg total) by mouth daily. Start taking on: August 13, 2022   apixaban 5 MG Tabs tablet Commonly known as: ELIQUIS Take 1 tablet (5 mg total) by mouth daily.   calcium acetate 667 MG capsule Commonly known as: PHOSLO Take 2 capsules (1,334 mg total) by  mouth 3 (three) times daily with meals.   carvedilol 25 MG tablet Commonly known as: COREG Take 1 tablet (25 mg total) by mouth 2 (two) times daily with a meal.   cloNIDine 0.2 MG tablet Commonly known as: CATAPRES Take 1 tablet (0.2 mg total) by mouth every 8 (eight) hours.   Dialyvite 800 0.8 MG Tabs Take 1 tablet by mouth daily.   HECTOROL IV Doxercalciferol (Hectorol)   hydrALAZINE 100 MG tablet Commonly known as: APRESOLINE Take 1  tablet (100 mg total) by mouth 3 (three) times daily.   ipratropium 17 MCG/ACT inhaler Commonly known as: ATROVENT HFA Inhale 2 puffs into the lungs every 6 (six) hours as needed for wheezing. What changed: when to take this   losartan 100 MG tablet Commonly known as: COZAAR Take 1 tablet (100 mg total) by mouth at bedtime.   Omron 3 Series BP Monitor Devi Use as directed 2 times a day   pantoprazole 40 MG tablet Commonly known as: PROTONIX Take 1 tablet (40 mg total) by mouth daily before breakfast.        Follow-up Information     [facility]. Call.   Why: schedule an appointment to establish a Primary care doctor Contact information: [address] [place] [address] [phone]               Allergies  Allergen Reactions   Oxycodone Itching    Consultations: Nephrology GI General surgery   Procedures/Studies: MR ABDOMEN MRCP WO CONTRAST  Result Date: 08/11/2022 CLINICAL DATA:  Abnormal liver on CT EXAM: MRI ABDOMEN WITHOUT CONTRAST  (INCLUDING MRCP) TECHNIQUE: Multiplanar multisequence MR imaging of the abdomen was performed. Heavily T2-weighted images of the biliary and pancreatic ducts were obtained, and three-dimensional MRCP images were rendered by post processing. COMPARISON:  CT abdomen/pelvis dated 08/09/2022 FINDINGS: Lower chest: Stable opacity along the lateral right lung base (series 4/image 4), presumably post infectious/inflammatory scarring. Cardiomegaly. Hepatobiliary: Low T2 signal with signal loss on in-phase imaging, suggesting iron deposition in the liver. No focal hepatic lesion is seen. No restricted diffusion. Intravenous contrast not administered. Gallbladder is unremarkable. No intrahepatic or extrahepatic ductal dilatation. Pancreas: 7 mm unilocular cyst in the pancreatic head (series 4/image 33). No pancreatic ductal dilatation. Spleen: Spleen is normal in size. Signal loss on in-phase  imaging, suggesting iron deposition. Adrenals/Urinary Tract:  Adrenal glands are within normal limits. Kidneys are within normal limits.  No hydronephrosis. Stomach/Bowel: Stomach is within normal limits. Visualized bowel is grossly unremarkable. Vascular/Lymphatic:  No evidence of aneurysm. No suspicious abdominal lymphadenopathy. Other:  No abdominal ascites. Musculoskeletal: No focal osseous lesions. IMPRESSION: Limited evaluation due to lack of intravenous contrast administration. However, no focal hepatic lesion is seen. The appearance of the liver on CT may reflect congestive hepatopathy due to cardiac dysfunction. Iron deposition in the liver and spleen. 7 mm unilocular cyst in the pancreatic head, likely benign. Follow-up MRI abdomen is suggested in 2 years, if clinically warranted. Electronically Signed   By: Julian Hy M.D.   On: 08/11/2022 19:58   [place] ASCITES (ABDOMEN LIMITED)  Result Date: 08/11/2022 CLINICAL DATA:  End-stage renal disease, ascites EXAM: LIMITED ABDOMEN ULTRASOUND FOR ASCITES TECHNIQUE: Limited ultrasound survey for ascites was performed in all four abdominal quadrants. COMPARISON:  CT abdomen and pelvis 08/09/2022 FINDINGS: Trace ascites, insufficient for paracentesis. IMPRESSION: Trace ascites, insufficient for paracentesis. Electronically Signed   By: Lavonia Dana M.D.   On: 08/11/2022 09:25   CT  ABDOMEN PELVIS W CONTRAST  Result Date: 08/09/2022 CLINICAL DATA:  Generalized abdominal pain and nausea and vomiting for 2 days. On dialysis. EXAM: CT ABDOMEN AND PELVIS WITH CONTRAST TECHNIQUE: Multidetector CT imaging of the abdomen and pelvis was performed using the standard protocol following bolus administration of intravenous contrast. RADIATION DOSE REDUCTION: This exam was performed according to the departmental dose-optimization program which includes automated exposure control, adjustment of the mA and/or kV according to patient size and/or use of iterative  reconstruction technique. CONTRAST:  124mL OMNIPAQUE IOHEXOL 300 MG/ML  SOLN COMPARISON:  Noncontrast CT on 03/17/2022 FINDINGS: Lower Chest: Stable marked cardiomegaly. Stable right basilar scarring. Hepatobiliary: Hypertrophy of caudate and left hepatic lobes is suspicious for cirrhosis. Periportal edema and enhancement is seen throughout the liver, which is nonspecific and may be due to hepatitis or hepatic congestion syndrome. Multiple tiny sub-centimeter hypervascular foci are also seen throughout the liver, which may represent cirrhotic nodules or hepatocellular carcinoma. Diffuse gallbladder wall thickening is likely secondary to hepatocellular disease. No evidence of biliary ductal dilatation. Pancreas:  No mass or inflammatory changes. Spleen: Within normal limits in size and appearance. Adrenals/Urinary Tract: No suspicious masses identified. No evidence of ureteral calculi or hydronephrosis. Stomach/Bowel: No evidence of obstruction, inflammatory process or abnormal fluid collections. Vascular/Lymphatic: Shotty retroperitoneal lymph nodes in the left paraaortic region show no significant change. No acute vascular findings. Reproductive:  No mass or other significant abnormality. Other: Diffuse mesenteric edema and mild-to-moderate ascites is noted. Musculoskeletal:  No suspicious bone lesions identified. IMPRESSION: Diffuse periportal edema and enhancement throughout the liver is nonspecific, and may be due to hepatitis or hepatic congestion syndrome. Probable hepatic cirrhosis. Multiple tiny sub-centimeter hypervascular foci throughout the liver, which may represent cirrhotic nodules or hepatocellular carcinoma. Recommend abdomen MRI without and with contrast for further characterization. Mild-to-moderate ascites and diffuse mesenteric edema. Stable marked cardiomegaly. Electronically Signed   By: Marlaine Hind M.D.   On: 08/09/2022 15:08     Discharge Exam: Vitals:   08/12/22 1330 08/12/22 1345   BP: (!) 155/101 110/83  Pulse: 68 72  Resp: 20 14  Temp:    SpO2: 100% 100%   Vitals:   08/12/22 1300 08/12/22 1315 08/12/22 1330 08/12/22 1345  BP: (!) 140/86 (!) 138/95 (!) 155/101 110/83  Pulse: 63 66 68 72  Resp: 14 15 20 14   Temp:      TempSrc:      SpO2: 100%  100% 100%  Weight:      Height:        General: Pt is alert, awake, not in acute distress Cardiovascular: RRR, S1/S2 +, no rubs, no gallops Respiratory: CTA bilaterally, no wheezing, no rhonchi Abdominal: Soft, NT, ND, bowel sounds + Extremities: no edema, no cyanosis    The results of significant diagnostics from this hospitalization (including imaging, microbiology, ancillary and laboratory) are listed below for reference.     Microbiology: Recent Results (from the past 240 hour(s))  MRSA Next Gen by PCR, Nasal     Status: None   Collection Time: 08/09/22  8:43 PM   Specimen: Nasal Mucosa; Nasal Swab  Result Value Ref Range Status   MRSA by PCR Next Gen NOT DETECTED NOT DETECTED Final    Comment: (NOTE) The GeneXpert MRSA Assay (FDA approved for NASAL specimens only), is one component of a comprehensive MRSA colonization surveillance program. It is not intended to diagnose MRSA infection nor to guide or monitor treatment for MRSA infections. Test performance is not FDA approved  in patients less than 54 years old. Performed at [facility], [address], [place], [place] [address]      Labs: BNP (last 3 results) Recent Labs    12/13/21 1005 12/26/21 2049 03/01/22 1040  BNP >4,500.0* >4,500.0* >1,027.2*   Basic Metabolic Panel: Recent Labs  Lab 08/09/22 1220 08/09/22 2247 08/10/22 0347 08/11/22 0430 08/12/22 0504  NA 140 136 138 137 134*  K 6.1* 3.9 4.4 5.1 4.3  CL 106 97* 100 102 98  CO2 22 26 25 23 27   GLUCOSE 95 95 79 89 94  BUN 99* 42* 42* 46* 27*  CREATININE 20.14* 11.09* 11.66* 13.69* 9.28*  CALCIUM 8.2* 8.5* 7.8* 7.7* 8.3*  MG  --   --   --  2.0 2.0   Liver Function  Tests: Recent Labs  Lab 08/09/22 1220 08/10/22 0347  AST 35 27  ALT 34 32  ALKPHOS 210* 208*  BILITOT 0.8 0.8  PROT 7.2 7.3  ALBUMIN 3.5 3.4*   Recent Labs  Lab 08/09/22 1220  LIPASE 52*   No results for input(s): "AMMONIA" in the last 168 hours. CBC: Recent Labs  Lab 08/09/22 1220 08/10/22 0347 08/11/22 0430 08/12/22 0504  WBC 5.4 5.2 3.9* 4.2  NEUTROABS 3.5  --   --   --   HGB 11.9* 12.2 12.1 11.7*  HCT 38.0 39.2 39.9 37.6  MCV 90.0 90.1 91.9 91.0  PLT 213 204 151 137*   Cardiac Enzymes: No results for input(s): "CKTOTAL", "CKMB", "CKMBINDEX", "TROPONINI" in the last 168 hours. BNP: Invalid input(s): "POCBNP" CBG: No results for input(s): "GLUCAP" in the last 168 hours. D-Dimer No results for input(s): "DDIMER" in the last 72 hours. Hgb A1c No results for input(s): "HGBA1C" in the last 72 hours. Lipid Profile No results for input(s): "CHOL", "HDL", "LDLCALC", "TRIG", "CHOLHDL", "LDLDIRECT" in the last 72 hours. Thyroid function studies No results for input(s): "TSH", "T4TOTAL", "T3FREE", "THYROIDAB" in the last 72 hours.  Invalid input(s): "FREET3" Anemia work up No results for input(s): "VITAMINB12", "FOLATE", "FERRITIN", "TIBC", "IRON", "RETICCTPCT" in the last 72 hours. Urinalysis    Component Value Date/Time   COLORURINE YELLOW 08/10/2022 0758   APPEARANCEUR CLEAR 08/10/2022 0758   LABSPEC 1.016 08/10/2022 0758   PHURINE 7.0 08/10/2022 0758   GLUCOSEU NEGATIVE 08/10/2022 0758   HGBUR NEGATIVE 08/10/2022 0758   BILIRUBINUR NEGATIVE 08/10/2022 0758   KETONESUR NEGATIVE 08/10/2022 0758   PROTEINUR >=300 (A) 08/10/2022 0758   UROBILINOGEN 0.2 10/17/2020 1026   NITRITE NEGATIVE 08/10/2022 0758   LEUKOCYTESUR TRACE (A) 08/10/2022 0758   Sepsis Labs Recent Labs  Lab 08/09/22 1220 08/10/22 0347 08/11/22 0430 08/12/22 0504  WBC 5.4 5.2 3.9* 4.2   Microbiology Recent Results (from the past 240 hour(s))  MRSA Next Gen by PCR, Nasal     Status:  None   Collection Time: 08/09/22  8:43 PM   Specimen: Nasal Mucosa; Nasal Swab  Result Value Ref Range Status   MRSA by PCR Next Gen NOT DETECTED NOT DETECTED Final    Comment: (NOTE) The GeneXpert MRSA Assay (FDA approved for NASAL specimens only), is one component of a comprehensive MRSA colonization surveillance program. It is not intended to diagnose MRSA infection nor to guide or monitor treatment for MRSA infections. Test performance is not FDA approved in patients less than 26 years old. Performed at [facility], [address] [place], [place] [address]      Time coordinating discharge: 35 minutes  SIGNED:   Edrick Whitehorn D  Manuella Ghazi, DO [facility] 08/12/2022, 1:55 PM  If 7PM-7AM, please contact night-coverage [email or web address]

## 2022-08-12 NOTE — Progress Notes (Signed)
Subjective:  Had HD yesterday removed 4 liters without issue-  BP transiently improved but then went up again late afternoon-  back on cardene-  she says belly is OK -  is hoping that she will be able to go home after HD   Objective Vital signs in last 24 hours: Vitals:   08/12/22 0600 08/12/22 0630 08/12/22 0700 08/12/22 0715  BP: (!) 175/91 (!) 148/100 (!) 160/96 (!) 148/87  Pulse: (!) 58 (!) 105 (!) 55 (!) 55  Resp: (!) 9 13 16 16   Temp:      TempSrc:      SpO2: 98% 100% 100% 100%  Weight:      Height:       Weight change: 0.1 kg  Intake/Output Summary (Last 24 hours) at 08/12/2022 0738 Last data filed at 08/12/2022 0729 Gross per 24 hour  Intake 529.15 ml  Output 4000 ml  Net -3470.85 ml   Dialyzes at [facility] TTS 4 hours, profile #2  EDW 58. HD Bath 2/2, Dialyzer 180, Heparin no. Access TDC.  Hectorol 2 and mircera 200  last hgb 10.8   Assessment/Plan: 41 year old BF with ESRD and noncompliance presenting with abdominal pain and high BP-  volume overloaded on imaging  1 abdominal pain-  large work up-  is on rocephin for possible SBP.  Attempted paracentesis but actually no fluid to give) GI involved-  severity of pain seems to be less-  now s/p MRI-  no suspicious hepatic lesion 2 ESRD: normally TTS-  non compliant-  HD yesterday off schedule , also today to get back on schedule-  3 Hypertension: BP high as OP suspect noncom with BP meds  and fluid-  max UF yesterday and today -  is still on cardene should be able to stop-  also on coreg/hydral/clonidine/ and cozaar-  should get BP down today -  was 2 kg under EDW yest after HD  4. Anemia of ESRD: not an issue right now, no ESA needed  5. Metabolic Bone Disease: cont phoslo and hectorol  6. Dispo-  if BP down after HD I would not be opposed to discharge so she could go to OP unit on Thursday.  I have strongly encouraged her to get a system in place to take BP meds on time and to get to OP HD   [place]: Basic Metabolic Panel: Recent Labs  Lab 08/10/22 0347 08/11/22 0430 08/12/22 0504  NA 138 137 134*  K 4.4 5.1 4.3  CL 100 102 98  CO2 25 23 27   GLUCOSE 79 89 94  BUN 42* 46* 27*  CREATININE 11.66* 13.69* 9.28*  CALCIUM 7.8* 7.7* 8.3*   Liver Function Tests: Recent Labs  Lab 08/09/22 1220 08/10/22 0347  AST 35 27  ALT 34 32  ALKPHOS 210* 208*  BILITOT 0.8 0.8  PROT 7.2 7.3  ALBUMIN 3.5 3.4*   Recent Labs  Lab 08/09/22 1220  LIPASE 52*   No results for input(s): "AMMONIA" in the last 168 hours. CBC: Recent Labs  Lab 08/09/22 1220 08/10/22 0347 08/11/22 0430 08/12/22 0504  WBC 5.4 5.2 3.9* 4.2  NEUTROABS 3.5  --   --   --   HGB 11.9* 12.2 12.1 11.7*  HCT 38.0 39.2 39.9 37.6  MCV 90.0 90.1 91.9 91.0  PLT 213 204 151 137*   Cardiac Enzymes: No results for input(s): "CKTOTAL", "CKMB", "CKMBINDEX", "TROPONINI" in the last 168 hours. CBG: No results  for input(s): "GLUCAP" in the last 168 hours.  Iron Studies: No results for input(s): "IRON", "TIBC", "TRANSFERRIN", "FERRITIN" in the last 72 hours. Studies/Results: MR ABDOMEN MRCP WO CONTRAST  Result Date: 08/11/2022 CLINICAL DATA:  Abnormal liver on CT EXAM: MRI ABDOMEN WITHOUT CONTRAST  (INCLUDING MRCP) TECHNIQUE: Multiplanar multisequence MR imaging of the abdomen was performed. Heavily T2-weighted images of the biliary and pancreatic ducts were obtained, and three-dimensional MRCP images were rendered by post processing. COMPARISON:  CT abdomen/pelvis dated 08/09/2022 FINDINGS: Lower chest: Stable opacity along the lateral right lung base (series 4/image 4), presumably post infectious/inflammatory scarring. Cardiomegaly. Hepatobiliary: Low T2 signal with signal loss on in-phase imaging, suggesting iron deposition in the liver. No focal hepatic lesion is seen. No restricted diffusion. Intravenous contrast not administered. Gallbladder is unremarkable. No intrahepatic or extrahepatic ductal  dilatation. Pancreas: 7 mm unilocular cyst in the pancreatic head (series 4/image 33). No pancreatic ductal dilatation. Spleen: Spleen is normal in size. Signal loss on in-phase imaging, suggesting iron deposition. Adrenals/Urinary Tract:  Adrenal glands are within normal limits. Kidneys are within normal limits.  No hydronephrosis. Stomach/Bowel: Stomach is within normal limits. Visualized bowel is grossly unremarkable. Vascular/Lymphatic:  No evidence of aneurysm. No suspicious abdominal lymphadenopathy. Other:  No abdominal ascites. Musculoskeletal: No focal osseous lesions. IMPRESSION: Limited evaluation due to lack of intravenous contrast administration. However, no focal hepatic lesion is seen. The appearance of the liver on CT may reflect congestive hepatopathy due to cardiac dysfunction. Iron deposition in the liver and spleen. 7 mm unilocular cyst in the pancreatic head, likely benign. Follow-up MRI abdomen is suggested in 2 years, if clinically warranted. Electronically Signed   By: Julian Hy M.D.   On: 08/11/2022 19:58   [place] ASCITES (ABDOMEN LIMITED)  Result Date: 08/11/2022 CLINICAL DATA:  End-stage renal disease, ascites EXAM: LIMITED ABDOMEN ULTRASOUND FOR ASCITES TECHNIQUE: Limited ultrasound survey for ascites was performed in all four abdominal quadrants. COMPARISON:  CT abdomen and pelvis 08/09/2022 FINDINGS: Trace ascites, insufficient for paracentesis. IMPRESSION: Trace ascites, insufficient for paracentesis. Electronically Signed   By: Lavonia Dana M.D.   On: 08/11/2022 09:25   Medications: Infusions:  cefTRIAXone (ROCEPHIN)  IV 2 g (08/11/22 1836)   niCARDipine 3 mg/hr (08/12/22 0729)    Scheduled Medications:  apixaban  5 mg Oral BID   calcium acetate  1,334 mg Oral TID WC   carvedilol  25 mg Oral BID WC   Chlorhexidine Gluconate Cloth  6 each Topical Q0600   Chlorhexidine Gluconate Cloth  6 each Topical Q0600   Chlorhexidine Gluconate Cloth  6 each Topical Q0600    cloNIDine  0.2 mg Oral Q8H   doxercalciferol  2 mcg Intravenous Q M,W,F-HD   hydrALAZINE  100 mg Oral TID   losartan  100 mg Oral QHS   sodium bicarbonate  50 mEq Intravenous Once   sodium zirconium cyclosilicate  10 g Oral Once    have reviewed scheduled and prn medications.  Physical Exam: General: NAD, pleasant Heart: RRR Lungs: mostly clear Abdomen: distended Extremities: no peripheral edema Dialysis Access: TDC and maturing left AVF    08/12/2022,7:38 AM  LOS: 3 days

## 2022-08-12 NOTE — Procedures (Signed)
   HEMODIALYSIS TREATMENT NOTE:   Uneventful 4 hour heparin-free treatment completed. Goal met: 4.1 liters removed.  All blood was returned.  "I feel drained and my body hurts" post HD - d/w primary nurse.   Rockwell Alexandria, RN

## 2022-08-12 NOTE — Care Management Important Message (Signed)
Important Message  Patient Details  Name: Cathy [name] MRN: [id number] Date of Birth: 1980-12-17   Medicare Important Message Given:  N/A - LOS <3 / Initial given by admissions     Tommy Medal 08/12/2022, 11:42 AM

## 2022-08-12 NOTE — TOC Transition Note (Signed)
Transition of Care [facility]) - CM/SW Discharge Note   Patient Details  Name: Cathy [name] MRN: [id number] Date of Birth: November 13, 1981  [facility] CM/SW Contact:  Shade Flood, LCSW Phone Number: 08/12/2022, 1:43 PM   Clinical Narrative:     Per MD, pt will likely dc home after HD today. Met with pt at bedside to provide ADTS resource list for assistance addressing SDOH needs as determined in pt's admission screening. Pt did not have any other TOC needs and she is anticipating dc later today.  Final next level of care: Home/Self Care Barriers to Discharge: Barriers Resolved   Patient Goals and CMS Choice Patient states their goals for this hospitalization and ongoing recovery are:: return home CMS Medicare.gov Compare Post Acute Care list provided to:: Patient Choice offered to / list presented to : Patient  Discharge Placement                       Discharge Plan and Services In-house Referral: Clinical Social Work Discharge Planning Services: CM Consult                                 Social Determinants of Health (SDOH) Interventions Food Insecurity Interventions: Inpatient TOC, Other (Comment) (ADTS resource list given) Housing Interventions: Inpatient TOC, Other (Comment) (ADTS resource list given) Transportation Interventions: Inpatient TOC, Other (Comment) (ADTS resource list given) Utilities Interventions: Inpatient TOC, Other (Comment) (ADTS resource list given)   Readmission Risk Interventions    08/11/2022   12:50 PM 03/18/2022    9:17 AM 03/05/2022    4:42 PM  Readmission Risk Prevention Plan  Transportation Screening Complete Complete Complete  Medication Review Press photographer) Complete Complete Complete  PCP or Specialist appointment within 3-5 days of discharge   Complete  HRI or Home Care Consult Complete Complete Complete  SW Recovery Care/Counseling Consult Complete Complete Complete  Palliative Care Screening Not  Applicable Not Applicable Not [place] Not Applicable Not Applicable Not Applicable

## 2022-08-13 ENCOUNTER — Emergency Department (HOSPITAL_COMMUNITY): Payer: Medicare HMO

## 2022-08-13 ENCOUNTER — Encounter (HOSPITAL_COMMUNITY): Payer: Self-pay | Admitting: *Deleted

## 2022-08-13 ENCOUNTER — Emergency Department (HOSPITAL_COMMUNITY)
Admission: EM | Admit: 2022-08-13 | Discharge: 2022-08-13 | Disposition: A | Discharge status: Home / Self Care | Payer: Medicare HMO | Attending: Emergency Medicine | Admitting: Emergency Medicine

## 2022-08-13 ENCOUNTER — Other Ambulatory Visit: Payer: Self-pay

## 2022-08-13 DIAGNOSIS — I132 Hypertensive heart and chronic kidney disease with heart failure and with stage 5 chronic kidney disease, or end stage renal disease: Secondary | ICD-10-CM | POA: Insufficient documentation

## 2022-08-13 DIAGNOSIS — Z7901 Long term (current) use of anticoagulants: Secondary | ICD-10-CM | POA: Diagnosis not present

## 2022-08-13 DIAGNOSIS — N186 End stage renal disease: Secondary | ICD-10-CM | POA: Diagnosis not present

## 2022-08-13 DIAGNOSIS — J45909 Unspecified asthma, uncomplicated: Secondary | ICD-10-CM | POA: Diagnosis not present

## 2022-08-13 DIAGNOSIS — R55 Syncope and collapse: Secondary | ICD-10-CM | POA: Insufficient documentation

## 2022-08-13 DIAGNOSIS — Z79899 Other long term (current) drug therapy: Secondary | ICD-10-CM | POA: Insufficient documentation

## 2022-08-13 DIAGNOSIS — Z992 Dependence on renal dialysis: Secondary | ICD-10-CM | POA: Diagnosis not present

## 2022-08-13 DIAGNOSIS — M549 Dorsalgia, unspecified: Secondary | ICD-10-CM | POA: Diagnosis not present

## 2022-08-13 DIAGNOSIS — R079 Chest pain, unspecified: Secondary | ICD-10-CM | POA: Diagnosis not present

## 2022-08-13 DIAGNOSIS — R252 Cramp and spasm: Secondary | ICD-10-CM | POA: Insufficient documentation

## 2022-08-13 DIAGNOSIS — I509 Heart failure, unspecified: Secondary | ICD-10-CM | POA: Insufficient documentation

## 2022-08-13 LAB — CBC
HCT: 45.8 % (ref 36.0–46.0)
Hemoglobin: 14.1 g/dL (ref 12.0–15.0)
MCH: 27.8 pg (ref 26.0–34.0)
MCHC: 30.8 g/dL (ref 30.0–36.0)
MCV: 90.2 fL (ref 80.0–100.0)
Platelets: 187 10*3/uL (ref 150–400)
RBC: 5.08 MIL/uL (ref 3.87–5.11)
RDW: 20.7 % — ABNORMAL HIGH (ref 11.5–15.5)
WBC: 9.5 10*3/uL (ref 4.0–10.5)
nRBC: 0 % (ref 0.0–0.2)

## 2022-08-13 LAB — BASIC METABOLIC PANEL
Anion gap: 16 — ABNORMAL HIGH (ref 5–15)
BUN: 27 mg/dL — ABNORMAL HIGH (ref 6–20)
CO2: 25 mmol/L (ref 22–32)
Calcium: 9.7 mg/dL (ref 8.9–10.3)
Chloride: 92 mmol/L — ABNORMAL LOW (ref 98–111)
Creatinine, Ser: 10.5 mg/dL — ABNORMAL HIGH (ref 0.44–1.00)
GFR, Estimated: 4 mL/min — ABNORMAL LOW (ref >60)
Glucose, Bld: 101 mg/dL — ABNORMAL HIGH (ref 70–99)
Potassium: 4.4 mmol/L (ref 3.5–5.1)
Sodium: 133 mmol/L — ABNORMAL LOW (ref 135–145)

## 2022-08-13 LAB — TROPONIN I (HIGH SENSITIVITY)
Troponin I (High Sensitivity): 43 ng/L — ABNORMAL HIGH (ref <18)
Troponin I (High Sensitivity): 51 ng/L — ABNORMAL HIGH (ref <18)

## 2022-08-13 LAB — HCG, SERUM, QUALITATIVE: Preg, Serum: NEGATIVE

## 2022-08-13 LAB — HEPATITIS B SURFACE ANTIBODY,QUALITATIVE

## 2022-08-13 LAB — HEPATITIS B CORE ANTIBODY, TOTAL

## 2022-08-13 LAB — HEPATITIS B SURFACE ANTIGEN

## 2022-08-13 MED ORDER — HYDRALAZINE HCL 25 MG PO TABS
100.0000 mg | ORAL_TABLET | Freq: Three times a day (TID) | ORAL | Status: DC
  Administered 2022-08-13: 100 mg via ORAL
  Filled 2022-08-13: qty 4

## 2022-08-13 MED ORDER — LOSARTAN POTASSIUM 25 MG PO TABS
100.0000 mg | ORAL_TABLET | Freq: Every day | ORAL | Status: DC
  Administered 2022-08-13: 100 mg via ORAL
  Filled 2022-08-13: qty 4

## 2022-08-13 MED ORDER — APIXABAN 5 MG PO TABS
5.0000 mg | ORAL_TABLET | Freq: Two times a day (BID) | ORAL | Status: DC
  Administered 2022-08-13: 5 mg via ORAL
  Filled 2022-08-13: qty 1

## 2022-08-13 MED ORDER — HYDROCODONE-ACETAMINOPHEN 5-325 MG PO TABS
1.0000 | ORAL_TABLET | Freq: Once | ORAL | Status: AC
  Administered 2022-08-13: 1 via ORAL
  Filled 2022-08-13: qty 1

## 2022-08-13 MED ORDER — CLONIDINE HCL 0.2 MG PO TABS
0.2000 mg | ORAL_TABLET | Freq: Three times a day (TID) | ORAL | Status: DC
  Administered 2022-08-13: 0.2 mg via ORAL
  Filled 2022-08-13: qty 1

## 2022-08-13 NOTE — ED Notes (Signed)
Pt refused any further blood draws

## 2022-08-13 NOTE — ED Provider Notes (Signed)
[facility] EMERGENCY DEPARTMENT Provider Note   CSN: [id number] Arrival date & time: 08/13/22  1611     History  Chief Complaint  Patient presents with   Chest Pain    Cathy [name] is a 41 y.o. female.   Chest Pain Patient presents for chest pain.  Medical history includes asthma, lupus nephritis, ESRD, HTN, depression, pericardial effusion, CHF, cirrhosis, anxiety, PE.  She had a recent admission to the hospital due to missed dialysis and hyperkalemia.  She underwent HD sessions over the past 3 days.  She was discharged yesterday.  Patient developed muscle cramps last night following her third session of dialysis this week.  Muscle cramps continued today.  At around 3 PM, they involved the low side of her chest.  For this reason, she presented to the ED.  Patient reports similar episodes of milligrams in the past.     Home Medications Prior to Admission medications   Medication Sig Start Date End Date Taking? Authorizing Provider  acetaminophen (TYLENOL) 500 MG tablet Take 1,000 mg by mouth every 6 (six) hours as needed for moderate pain.   Yes [provider]  albuterol (VENTOLIN HFA) 108 (90 Base) MCG/ACT inhaler Inhale 2 puffs into the lungs every 6 (six) hours as needed for wheezing or shortness of breath. 12/17/21 12/17/22 Yes Rai, Ripudeep K, MD  amLODipine (NORVASC) 5 MG tablet Take 1 tablet (5 mg total) by mouth daily. 08/13/22 09/12/22 Yes Shah, Pratik D, DO  apixaban (ELIQUIS) 5 MG TABS tablet Take 1 tablet (5 mg total) by mouth daily. 08/12/22 09/11/22 Yes Shah, Pratik D, DO  calcium acetate (PHOSLO) 667 MG capsule Take 2 capsules (1,334 mg total) by mouth 3 (three) times daily with meals. 03/05/22  Yes Tat, Shanon Brow, MD  carvedilol (COREG) 25 MG tablet Take 1 tablet (25 mg total) by mouth 2 (two) times daily with a meal. 03/05/22  Yes Tat, Shanon Brow, MD  cloNIDine (CATAPRES) 0.2 MG tablet Take 1 tablet (0.2 mg total) by mouth every 8 (eight) hours. Patient taking  differently: Take 0.3 mg by mouth every 8 (eight) hours. 03/05/22  Yes Tat, Shanon Brow, MD  Fexofenadine HCl [facility] ALLERGY PO) Take 1 tablet by mouth daily as needed (mucas relief).   Yes [provider]  hydrALAZINE (APRESOLINE) 100 MG tablet Take 1 tablet (100 mg total) by mouth 3 (three) times daily. 03/05/22  Yes Tat, Shanon Brow, MD  ipratropium (ATROVENT HFA) 17 MCG/ACT inhaler Inhale 2 puffs into the lungs every 6 (six) hours as needed for wheezing. Patient taking differently: Inhale 2 puffs into the lungs at bedtime. 12/17/21 12/17/22 Yes Rai, Ripudeep K, MD  losartan (COZAAR) 100 MG tablet Take 1 tablet (100 mg total) by mouth at bedtime. 03/05/22  Yes Tat, Shanon Brow, MD  pantoprazole (PROTONIX) 40 MG tablet Take 1 tablet (40 mg total) by mouth daily before breakfast. 08/12/22 09/11/22 Yes Shah, Pratik D, DO  B Complex-C-Folic Acid (DIALYVITE 846) 0.8 MG TABS Take 1 tablet by mouth daily. Patient not taking: Reported on 08/13/2022 07/05/22   [provider]  Blood Pressure Monitoring (OMRON 3 SERIES BP MONITOR) DEVI Use as directed 2 times a day 01/09/22   Shelly Coss, MD  Doxercalciferol (HECTOROL IV) Doxercalciferol (Hectorol) Patient not taking: Reported on 08/13/2022 03/06/22 03/05/23  [provider]  fenofibrate 160 MG tablet Take 1 tablet (160 mg total) by mouth daily. 12/31/20 01/20/21  Elmarie Shiley, MD      Allergies    Oxycodone  Review of Systems   Review of Systems  Cardiovascular:  Positive for chest pain.  Gastrointestinal:  Positive for constipation.  Musculoskeletal:  Positive for arthralgias and myalgias.  All other systems reviewed and are negative.   Physical Exam Updated Vital Signs BP (!) 126/112   Pulse 60   Temp 98.1 F (36.7 C) (Oral)   Resp 14   Ht 5\' 7"  (1.702 m)   Wt 59.4 kg   SpO2 98%   BMI 20.52 kg/m  Physical Exam Vitals and nursing note reviewed.  Constitutional:      General: She is not in acute distress.    Appearance: She is  well-developed. She is ill-appearing (Chronically). She is not toxic-appearing or diaphoretic.  HENT:     Head: Normocephalic and atraumatic.  Eyes:     Extraocular Movements: Extraocular movements intact.     Conjunctiva/sclera: Conjunctivae normal.  Cardiovascular:     Rate and Rhythm: Normal rate and regular rhythm.     Heart sounds: No murmur heard. Pulmonary:     Effort: Pulmonary effort is normal. No respiratory distress.     Breath sounds: Normal breath sounds. No decreased breath sounds, wheezing, rhonchi or rales.  Chest:     Chest wall: No tenderness.  Abdominal:     Palpations: Abdomen is soft.     Tenderness: There is no abdominal tenderness.  Musculoskeletal:        General: No swelling.     Cervical back: Normal range of motion and neck supple.     Right lower leg: No edema.     Left lower leg: No edema.  Skin:    General: Skin is warm and dry.     Capillary Refill: Capillary refill takes less than 2 seconds.  Neurological:     General: No focal deficit present.     Mental Status: She is alert and oriented to person, place, and time.  Psychiatric:        Mood and Affect: Mood normal.        Behavior: Behavior normal.     ED Results / Procedures / Treatments   Labs (all labs ordered are listed, but only abnormal results are displayed) Labs Reviewed  BASIC METABOLIC PANEL - Abnormal; Notable for the following components:      Result Value   Sodium 133 (*)    Chloride 92 (*)    Glucose, Bld 101 (*)    BUN 27 (*)    Creatinine, Ser 10.50 (*)    GFR, Estimated 4 (*)    Anion gap 16 (*)    All other components within normal limits  CBC - Abnormal; Notable for the following components:   RDW 20.7 (*)    All other components within normal limits  TROPONIN I (HIGH SENSITIVITY) - Abnormal; Notable for the following components:   Troponin I (High Sensitivity) 43 (*)    All other components within normal limits  TROPONIN I (HIGH SENSITIVITY) - Abnormal; Notable  for the following components:   Troponin I (High Sensitivity) 51 (*)    All other components within normal limits  HCG, SERUM, QUALITATIVE  URINALYSIS, ROUTINE W REFLEX MICROSCOPIC  POC URINE PREG, ED  I-STAT BETA HCG BLOOD, ED ([facility], [facility], [facility] ONLY)    EKG EKG Interpretation  Date/Time:  Wednesday August 13 2022 16:32:41 EDT Ventricular Rate:  72 PR Interval:  146 QRS Duration: 74 QT Interval:  444 QTC Calculation: 486 R Axis:   83 Text Interpretation: Normal sinus  rhythm Left atrial enlargement Left ventricular hypertrophy Confirmed by Godfrey Pick [id number] on 08/13/2022 8:07:02 PM  Radiology DG Chest 2 View  Result Date: 08/13/2022 CLINICAL DATA:  Chest pain. Presyncope. Asthma. Smoker. History of pulmonary embolism. EXAM: CHEST - 2 VIEW COMPARISON:  03/17/2022 FINDINGS: Dialysis catheter tip at high SVC. Midline trachea. Mild cardiomegaly. No pleural effusion or pneumothorax. Mild biapical pleuroparenchymal scarring. Suspect scarring at the right lung base laterally, similar. No congestive failure. IMPRESSION: Cardiomegaly without congestive failure. Electronically Signed   By: Abigail Miyamoto M.D.   On: 08/13/2022 17:20    Procedures Procedures    Medications Ordered in ED Medications  apixaban (ELIQUIS) tablet 5 mg (5 mg Oral Given 08/13/22 2109)  cloNIDine (CATAPRES) tablet 0.2 mg (0.2 mg Oral Given 08/13/22 2109)  hydrALAZINE (APRESOLINE) tablet 100 mg (100 mg Oral Given 08/13/22 2109)  losartan (COZAAR) tablet 100 mg (100 mg Oral Given 08/13/22 2109)  HYDROcodone-acetaminophen (NORCO/VICODIN) 5-325 MG per tablet 1 tablet (1 tablet Oral Given 08/13/22 2109)    ED Course/ Medical Decision Making/ A&P                           Medical Decision Making Amount and/or Complexity of Data Reviewed Labs: ordered. Radiology: ordered.  Risk Prescription drug management.   This patient presents to the ED for concern of muscle cramps, this involves an extensive number of treatment  options, and is a complaint that carries with it a high risk of complications and morbidity.  The differential diagnosis includes dehydration, electrolyte abnormalities, fluid shifts from recent dialysis, ACS   Co morbidities that complicate the patient evaluation  asthma, lupus nephritis, ESRD, HTN, depression, pericardial effusion, CHF, cirrhosis, anxiety, PE   Additional history obtained:  Additional history obtained from N/A External records from outside source obtained and reviewed including EMR   Lab Tests:  I Ordered, and personally interpreted labs.  The pertinent results include: Patient has elevated creatinine and BUN consistent with ESRD.  Her calcium has been trending up over the past several days.  This might explain her diffuse areas of pain.  She has mild elevation in troponin, consistent with baseline.  She has normal hemoglobin with no leukocytosis.   Imaging Studies ordered:  I ordered imaging studies including chest x-ray I independently visualized and interpreted imaging which showed no acute findings I agree with the radiologist interpretation   Cardiac Monitoring: / EKG:  The patient was maintained on a cardiac monitor.  I personally viewed and interpreted the cardiac monitored which showed an underlying rhythm of: Sinus rhythm  Problem List / ED Course / Critical interventions / Medication management  Patient presents for chest pain.  Onset was 2 hours prior to arrival.  At the time, she was shopping in a grocery store.  In addition to her chest pain, she had near syncopal symptoms and back pain.  Vital signs on arrival in the ED are notable for moderate hypertension.  Prior to being bedded in the ED, diagnostic work-up was initiated.  Results of lab work showed baseline elevation in creatinine and BUN, consistent with ESRD.  Troponin is mildly elevated (also baseline).  Hemoglobin is normal and patient has no leukocytosis.  Her creatinine has been trending up  over the past several days.  She has received dialysis every day for the past 3 days.  She is also on PhosLo which may be contributing to increasing calcium.  Increasing calcium may can be contributing to  her recent symptoms.  Does endorse recent constipation.  Chest x-ray shows baseline cardiomegaly.  On assessment, patient is chronically ill-appearing.  She is nondistressed and her breathing is unlabored.  She describes diffuse areas of pain.  She endorses mild areas of tenderness in lower extremity muscles and chest wall.  Patient was given Norco for analgesia.  Currently, her blood pressures are elevated.  She is due for her evening medications and these were ordered.  Patient states that she does continue to urinate but it is infrequent.  She was noted provide a urine sample while in the ED.  Patient improved symptoms and does feel comfortable discharge home.  She is scheduled for dialysis tomorrow.  Currently, patient does not have a PCP.  Her medications are managed through her dialysis center.  She was given contact information to establish a primary care doctor.  She was advised to discuss her home medications, including her PhosLo due to her increasing calcium.  Patient was discharged in good condition. I ordered medication including Norco for analgesia; Catapres, hydralazine, and Cozaar for hypertension Reevaluation of the patient after these medicines showed that the patient improved I have reviewed the patients home medicines and have made adjustments as needed   Social Determinants of Health:  Currently does not have a PCP but does have interest in establishing primary care.         Final Clinical Impression(s) / ED Diagnoses Final diagnoses:  Muscle cramps    Rx / DC Orders ED Discharge Orders     None         Godfrey Pick, MD 08/13/22 2321

## 2022-08-13 NOTE — Discharge Instructions (Addendum)
There is a telephone number below that you can call to establish a primary care doctor.  You should discuss your chronic illnesses and daily home medications with someone who can manage you over the long-term.  Continue to go to your dialysis sessions as scheduled.  Return to the emergency department at any time for any new or worsening symptoms of concern.

## 2022-08-13 NOTE — ED Notes (Signed)
Pt father Rolan Lipa is leaving. Per Johnnie please call with any changes.

## 2022-08-13 NOTE — ED Triage Notes (Signed)
Pt requested to have IV to right AC removed.  EMS had placed 18 G to rt AC prior to arrival. IV removed and catheter intact and dsg applied to site.

## 2022-08-13 NOTE — ED Triage Notes (Signed)
Pt with mid CP x 2 hours while in grocery store, states she felt like she was going pass out.  Also with mid back pain.
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
## 2022-08-14 ENCOUNTER — Encounter (HOSPITAL_COMMUNITY): Payer: Self-pay | Admitting: Pharmacist Clinician (PhC)/ Clinical Pharmacy Specialist

## 2022-08-14 NOTE — Telephone Encounter (Signed)
LMOM for pt call office

## 2022-08-16 DIAGNOSIS — Z7689 Persons encountering health services in other specified circumstances: Secondary | ICD-10-CM | POA: Diagnosis not present

## 2022-08-16 DIAGNOSIS — N2581 Secondary hyperparathyroidism of renal origin: Secondary | ICD-10-CM | POA: Diagnosis not present

## 2022-08-16 DIAGNOSIS — Z992 Dependence on renal dialysis: Secondary | ICD-10-CM | POA: Diagnosis not present

## 2022-08-16 DIAGNOSIS — N186 End stage renal disease: Secondary | ICD-10-CM | POA: Diagnosis not present

## 2022-08-19 DIAGNOSIS — Z992 Dependence on renal dialysis: Secondary | ICD-10-CM | POA: Diagnosis not present

## 2022-08-19 DIAGNOSIS — Z7689 Persons encountering health services in other specified circumstances: Secondary | ICD-10-CM | POA: Diagnosis not present

## 2022-08-19 DIAGNOSIS — N2581 Secondary hyperparathyroidism of renal origin: Secondary | ICD-10-CM | POA: Diagnosis not present

## 2022-08-19 DIAGNOSIS — N186 End stage renal disease: Secondary | ICD-10-CM | POA: Diagnosis not present

## 2022-08-20 ENCOUNTER — Encounter: Payer: Medicare HMO | Admitting: Vascular Surgery

## 2022-08-21 ENCOUNTER — Telehealth: Payer: Self-pay | Admitting: Vascular Surgery

## 2022-08-21 NOTE — Telephone Encounter (Signed)
Noted  

## 2022-08-23 DIAGNOSIS — Z7689 Persons encountering health services in other specified circumstances: Secondary | ICD-10-CM | POA: Diagnosis not present

## 2022-08-23 DIAGNOSIS — N186 End stage renal disease: Secondary | ICD-10-CM | POA: Diagnosis not present

## 2022-08-23 DIAGNOSIS — N2581 Secondary hyperparathyroidism of renal origin: Secondary | ICD-10-CM | POA: Diagnosis not present

## 2022-08-23 DIAGNOSIS — Z992 Dependence on renal dialysis: Secondary | ICD-10-CM | POA: Diagnosis not present

## 2022-08-26 DIAGNOSIS — Z7689 Persons encountering health services in other specified circumstances: Secondary | ICD-10-CM | POA: Diagnosis not present

## 2022-08-26 DIAGNOSIS — N2581 Secondary hyperparathyroidism of renal origin: Secondary | ICD-10-CM | POA: Diagnosis not present

## 2022-08-26 DIAGNOSIS — N186 End stage renal disease: Secondary | ICD-10-CM | POA: Diagnosis not present

## 2022-08-26 DIAGNOSIS — Z992 Dependence on renal dialysis: Secondary | ICD-10-CM | POA: Diagnosis not present

## 2022-08-28 DIAGNOSIS — Z7689 Persons encountering health services in other specified circumstances: Secondary | ICD-10-CM | POA: Diagnosis not present

## 2022-08-30 DIAGNOSIS — N186 End stage renal disease: Secondary | ICD-10-CM | POA: Diagnosis not present

## 2022-08-30 DIAGNOSIS — Z992 Dependence on renal dialysis: Secondary | ICD-10-CM | POA: Diagnosis not present

## 2022-08-30 DIAGNOSIS — Z7689 Persons encountering health services in other specified circumstances: Secondary | ICD-10-CM | POA: Diagnosis not present

## 2022-08-30 DIAGNOSIS — M3214 Glomerular disease in systemic lupus erythematosus: Secondary | ICD-10-CM | POA: Diagnosis not present

## 2022-09-02 ENCOUNTER — Emergency Department (HOSPITAL_COMMUNITY)
Admission: EM | Admit: 2022-09-02 | Discharge: 2022-09-02 | Disposition: A | Discharge status: Home / Self Care | Payer: Medicare HMO | Attending: Emergency Medicine | Admitting: Emergency Medicine

## 2022-09-02 ENCOUNTER — Encounter (HOSPITAL_COMMUNITY): Payer: Self-pay

## 2022-09-02 ENCOUNTER — Other Ambulatory Visit: Payer: Self-pay

## 2022-09-02 ENCOUNTER — Emergency Department (HOSPITAL_COMMUNITY): Payer: Medicare HMO

## 2022-09-02 DIAGNOSIS — Z7901 Long term (current) use of anticoagulants: Secondary | ICD-10-CM | POA: Diagnosis not present

## 2022-09-02 DIAGNOSIS — Z79899 Other long term (current) drug therapy: Secondary | ICD-10-CM | POA: Diagnosis not present

## 2022-09-02 DIAGNOSIS — N186 End stage renal disease: Secondary | ICD-10-CM | POA: Insufficient documentation

## 2022-09-02 DIAGNOSIS — E875 Hyperkalemia: Secondary | ICD-10-CM | POA: Insufficient documentation

## 2022-09-02 DIAGNOSIS — I120 Hypertensive chronic kidney disease with stage 5 chronic kidney disease or end stage renal disease: Secondary | ICD-10-CM | POA: Insufficient documentation

## 2022-09-02 DIAGNOSIS — Z992 Dependence on renal dialysis: Secondary | ICD-10-CM | POA: Insufficient documentation

## 2022-09-02 DIAGNOSIS — R0602 Shortness of breath: Secondary | ICD-10-CM | POA: Diagnosis present

## 2022-09-02 DIAGNOSIS — I10 Essential (primary) hypertension: Secondary | ICD-10-CM

## 2022-09-02 LAB — BASIC METABOLIC PANEL
Anion gap: 17 — ABNORMAL HIGH (ref 5–15)
BUN: 112 mg/dL — ABNORMAL HIGH (ref 6–20)
CO2: 20 mmol/L — ABNORMAL LOW (ref 22–32)
Calcium: 8.1 mg/dL — ABNORMAL LOW (ref 8.9–10.3)
Chloride: 102 mmol/L (ref 98–111)
Creatinine, Ser: 17.51 mg/dL — ABNORMAL HIGH (ref 0.44–1.00)
GFR, Estimated: 2 mL/min — ABNORMAL LOW (ref >60)
Glucose, Bld: 113 mg/dL — ABNORMAL HIGH (ref 70–99)
Potassium: 5.6 mmol/L — ABNORMAL HIGH (ref 3.5–5.1)
Sodium: 139 mmol/L (ref 135–145)

## 2022-09-02 LAB — CBC WITH DIFFERENTIAL/PLATELET
Abs Immature Granulocytes: 0.02 10*3/uL (ref 0.00–0.07)
Basophils Absolute: 0.1 10*3/uL (ref 0.0–0.1)
Basophils Relative: 1 %
Eosinophils Absolute: 0.3 10*3/uL (ref 0.0–0.5)
Eosinophils Relative: 5 %
HCT: 34.4 % — ABNORMAL LOW (ref 36.0–46.0)
Hemoglobin: 10.9 g/dL — ABNORMAL LOW (ref 12.0–15.0)
Immature Granulocytes: 0 %
Lymphocytes Relative: 25 %
Lymphs Abs: 1.5 10*3/uL (ref 0.7–4.0)
MCH: 28.6 pg (ref 26.0–34.0)
MCHC: 31.7 g/dL (ref 30.0–36.0)
MCV: 90.3 fL (ref 80.0–100.0)
Monocytes Absolute: 0.7 10*3/uL (ref 0.1–1.0)
Monocytes Relative: 12 %
Neutro Abs: 3.4 10*3/uL (ref 1.7–7.7)
Neutrophils Relative %: 57 %
Platelets: 222 10*3/uL (ref 150–400)
RBC: 3.81 MIL/uL — ABNORMAL LOW (ref 3.87–5.11)
RDW: 21.4 % — ABNORMAL HIGH (ref 11.5–15.5)
WBC Morphology: REACTIVE
WBC: 5.8 10*3/uL (ref 4.0–10.5)
nRBC: 0 % (ref 0.0–0.2)

## 2022-09-02 LAB — HEPATITIS C ANTIBODY: HCV Ab: NONREACTIVE

## 2022-09-02 LAB — HEPATITIS B CORE ANTIBODY, TOTAL: Hep B Core Total Ab: NONREACTIVE

## 2022-09-02 LAB — HEPATITIS B SURFACE ANTIGEN: Hepatitis B Surface Ag: NONREACTIVE

## 2022-09-02 LAB — HEPATITIS B SURFACE ANTIBODY,QUALITATIVE: Hep B S Ab: REACTIVE — AB

## 2022-09-02 MED ORDER — MORPHINE SULFATE (PF) 4 MG/ML IV SOLN
4.0000 mg | Freq: Once | INTRAVENOUS | Status: AC
  Administered 2022-09-02: 4 mg via INTRAVENOUS
  Filled 2022-09-02: qty 1

## 2022-09-02 MED ORDER — CLONIDINE HCL 0.2 MG PO TABS
0.2000 mg | ORAL_TABLET | Freq: Once | ORAL | Status: AC
  Administered 2022-09-02: 0.2 mg via ORAL
  Filled 2022-09-02: qty 1

## 2022-09-02 MED ORDER — APIXABAN 5 MG PO TABS
5.0000 mg | ORAL_TABLET | Freq: Once | ORAL | Status: AC
  Administered 2022-09-02: 5 mg via ORAL
  Filled 2022-09-02: qty 1

## 2022-09-02 MED ORDER — DIPHENHYDRAMINE HCL 50 MG/ML IJ SOLN
25.0000 mg | Freq: Once | INTRAMUSCULAR | Status: AC
  Administered 2022-09-02: 25 mg via INTRAVENOUS
  Filled 2022-09-02: qty 0.5

## 2022-09-02 MED ORDER — CARVEDILOL 12.5 MG PO TABS
25.0000 mg | ORAL_TABLET | Freq: Once | ORAL | Status: AC
  Administered 2022-09-02: 25 mg via ORAL
  Filled 2022-09-02: qty 2

## 2022-09-02 MED ORDER — HYDROCODONE-ACETAMINOPHEN 5-325 MG PO TABS: 1.0000 | ORAL_TABLET | Freq: Once | ORAL | Status: DC

## 2022-09-02 MED ORDER — HYDRALAZINE HCL 25 MG PO TABS
100.0000 mg | ORAL_TABLET | Freq: Once | ORAL | Status: AC
  Administered 2022-09-02: 100 mg via ORAL
  Filled 2022-09-02: qty 4

## 2022-09-02 MED ORDER — ONDANSETRON HCL 4 MG/2ML IJ SOLN
4.0000 mg | Freq: Once | INTRAMUSCULAR | Status: AC
  Administered 2022-09-02: 4 mg via INTRAVENOUS
  Filled 2022-09-02: qty 2

## 2022-09-02 MED ORDER — CHLORHEXIDINE GLUCONATE CLOTH 2 % EX PADS: 6.0000 | MEDICATED_PAD | Freq: Every day | CUTANEOUS | Status: DC

## 2022-09-02 MED ORDER — AMLODIPINE BESYLATE 5 MG PO TABS
5.0000 mg | ORAL_TABLET | Freq: Once | ORAL | Status: AC
  Administered 2022-09-02: 5 mg via ORAL
  Filled 2022-09-02: qty 1

## 2022-09-02 MED ORDER — ONDANSETRON 4 MG PO TBDP: 4.0000 mg | ORAL_TABLET | Freq: Once | ORAL | Status: DC

## 2022-09-02 NOTE — Progress Notes (Signed)
Well known to [place]-  many hospitalizations for missed HD and hypertensive urgency.  Presents again for same-  last HD was 9/26.  Presents with hypertensive urgency and slightly high K-  no oxygen requirement.  Plan will be for inpatient HD this AM with the hope that she can be discharged to resume her usual OP HD on Thursday   [place]

## 2022-09-02 NOTE — ED Provider Notes (Signed)
[facility] EMERGENCY DEPARTMENT Provider Note   CSN: [id number] Arrival date & time: 09/02/22  4259     History  Chief Complaint  Patient presents with   Shortness of Breath    Cathy [name] is a 41 y.o. female.  Pt is a 41 yo female with a pmhx significant for ESRD on HD (T, TH, and Sat), HTN, Depression, Lupus, PE, and GERD.  Pt said she has been having trouble getting child care for her 24 year old, so has missed her dialysis on Thursday and Saturday.  She was supposed to go today, but said they told her she needed to get her electrolytes checked in the ED because she missed too many sessions.  Pt said her dad took off work today so she could come in and get dialysis.  Pt is sob.  She is nauseous.  She said she could not take her bp meds this am because she was too nauseous and could not eat.  She has abd pain which she always gets when she misses too many sessions.       Home Medications Prior to Admission medications   Medication Sig Start Date End Date Taking? Authorizing Provider  acetaminophen (TYLENOL) 500 MG tablet Take 1,000 mg by mouth every 6 (six) hours as needed for moderate pain.   Yes [provider]  albuterol (VENTOLIN HFA) 108 (90 Base) MCG/ACT inhaler Inhale 2 puffs into the lungs every 6 (six) hours as needed for wheezing or shortness of breath. 12/17/21 12/17/22 Yes Rai, Ripudeep K, MD  amLODipine (NORVASC) 5 MG tablet Take 1 tablet (5 mg total) by mouth daily. 08/13/22 09/12/22 Yes Shah, Pratik D, DO  apixaban (ELIQUIS) 5 MG TABS tablet Take 1 tablet (5 mg total) by mouth daily. Patient taking differently: Take 5 mg by mouth 2 (two) times daily. 08/12/22 09/11/22 Yes Shah, Pratik D, DO  B Complex-C-Folic Acid (DIALYVITE 563) 0.8 MG TABS Take 1 tablet by mouth daily. 07/05/22  Yes [provider]  carvedilol (COREG) 25 MG tablet Take 1 tablet (25 mg total) by mouth 2 (two) times daily with a meal. 03/05/22  Yes Tat, David, MD  cloNIDine (CATAPRES)  0.3 MG tablet Take 0.3 mg by mouth 3 (three) times daily. 08/19/22  Yes [provider]  hydrALAZINE (APRESOLINE) 100 MG tablet Take 1 tablet (100 mg total) by mouth 3 (three) times daily. 03/05/22  Yes Tat, Shanon Brow, MD  ipratropium (ATROVENT HFA) 17 MCG/ACT inhaler Inhale 2 puffs into the lungs every 6 (six) hours as needed for wheezing. Patient taking differently: Inhale 2 puffs into the lungs at bedtime. 12/17/21 12/17/22 Yes Rai, Ripudeep K, MD  losartan (COZAAR) 50 MG tablet Take 50 mg by mouth at bedtime. 08/19/22  Yes [provider]  pantoprazole (PROTONIX) 40 MG tablet Take 1 tablet (40 mg total) by mouth daily before breakfast. 08/12/22 09/11/22 Yes Shah, Pratik D, DO  sevelamer carbonate (RENVELA) 800 MG tablet Take 800 mg by mouth 3 (three) times daily. 08/19/22  Yes [provider]  Blood Pressure Monitoring (OMRON 3 SERIES BP MONITOR) DEVI Use as directed 2 times a day 01/09/22   Shelly Coss, MD  calcium acetate (PHOSLO) 667 MG capsule Take 2 capsules (1,334 mg total) by mouth 3 (three) times daily with meals. Patient not taking: Reported on 09/02/2022 03/05/22   Orson Eva, MD  cloNIDine (CATAPRES) 0.2 MG tablet Take 1 tablet (0.2 mg total) by mouth every 8 (eight) hours. Patient not taking: Reported  on 09/02/2022 03/05/22   Orson Eva, MD  Doxercalciferol (HECTOROL IV) Doxercalciferol (Hectorol) Patient not taking: Reported on 08/13/2022 03/06/22 03/05/23  [provider]  losartan (COZAAR) 100 MG tablet Take 1 tablet (100 mg total) by mouth at bedtime. Patient not taking: Reported on 09/02/2022 03/05/22   Orson Eva, MD  fenofibrate 160 MG tablet Take 1 tablet (160 mg total) by mouth daily. 12/31/20 01/20/21  Regalado, Jerald Kief A, MD      Allergies    Oxycodone    Review of Systems   Review of Systems  Respiratory:  Positive for shortness of breath.   Gastrointestinal:  Positive for abdominal pain.  All other systems reviewed and are negative.   Physical  Exam Updated Vital Signs BP (!) 173/106   Pulse 60   Temp 98 F (36.7 C) (Oral)   Resp 19   Ht 5\' 7"  (1.702 m)   Wt 59.4 kg   SpO2 100%   BMI 20.52 kg/m  Physical Exam Vitals and nursing note reviewed.  Constitutional:      Appearance: She is well-developed.  HENT:     Head: Normocephalic and atraumatic.     Mouth/Throat:     Mouth: Mucous membranes are moist.  Eyes:     Extraocular Movements: Extraocular movements intact.     Pupils: Pupils are equal, round, and reactive to light.  Cardiovascular:     Rate and Rhythm: Normal rate and regular rhythm.  Pulmonary:     Breath sounds: Rhonchi present.  Chest:     Comments: Dialysis catheter right upper chest Abdominal:     General: There is distension.     Palpations: Abdomen is soft.     Tenderness: There is generalized abdominal tenderness.  Musculoskeletal:        General: Normal range of motion.     Cervical back: Normal range of motion and neck supple.  Skin:    General: Skin is warm.     Capillary Refill: Capillary refill takes less than 2 seconds.  Neurological:     General: No focal deficit present.     Mental Status: She is alert and oriented to person, place, and time.  Psychiatric:        Mood and Affect: Mood normal.        Behavior: Behavior normal.     ED Results / Procedures / Treatments   Labs (all labs ordered are listed, but only abnormal results are displayed) Labs Reviewed  BASIC METABOLIC PANEL - Abnormal; Notable for the following components:      Result Value   Potassium 5.6 (*)    CO2 20 (*)    Glucose, Bld 113 (*)    BUN 112 (*)    Creatinine, Ser 17.51 (*)    Calcium 8.1 (*)    GFR, Estimated 2 (*)    Anion gap 17 (*)    All other components within normal limits  CBC WITH DIFFERENTIAL/PLATELET - Abnormal; Notable for the following components:   RBC 3.81 (*)    Hemoglobin 10.9 (*)    HCT 34.4 (*)    RDW 21.4 (*)    All other components within normal limits  HEPATITIS B SURFACE  ANTIGEN  HEPATITIS B SURFACE ANTIBODY,QUALITATIVE  HEPATITIS B SURFACE ANTIBODY, QUANTITATIVE  HEPATITIS B CORE ANTIBODY, TOTAL  HEPATITIS C ANTIBODY  I-STAT BETA HCG BLOOD, ED ([facility], [facility], [facility] ONLY)    EKG None  Radiology DG Chest Portable 1 View  Result Date: 09/02/2022 CLINICAL DATA:  End-stage renal disease on hemodialysis, has not been to last to dialysis sessions EXAM: PORTABLE CHEST 1 VIEW COMPARISON:  Portable exam 0846 hours compared 08/13/2022 FINDINGS: RIGHT jugular dual-lumen central venous catheter with tip projecting over SVC. Enlargement of cardiac silhouette with pulmonary vascular congestion. Minimal interstitial infiltrate consistent with pulmonary edema/fluid overload. Small bibasilar effusions. No pneumothorax or acute osseous findings. IMPRESSION: Mild pulmonary edema/fluid overload with small bibasilar effusions. Electronically Signed   By: Lavonia Dana M.D.   On: 09/02/2022 08:53    Procedures Procedures    Medications Ordered in ED Medications  Chlorhexidine Gluconate Cloth 2 % PADS 6 each (has no administration in time range)  amLODipine (NORVASC) tablet 5 mg (5 mg Oral Given 09/02/22 0940)  apixaban (ELIQUIS) tablet 5 mg (5 mg Oral Given 09/02/22 0940)  carvedilol (COREG) tablet 25 mg (25 mg Oral Given 09/02/22 0940)  cloNIDine (CATAPRES) tablet 0.2 mg (0.2 mg Oral Given 09/02/22 0941)  hydrALAZINE (APRESOLINE) tablet 100 mg (100 mg Oral Given 09/02/22 0940)  ondansetron (ZOFRAN) injection 4 mg (4 mg Intravenous Given 09/02/22 0943)  morphine (PF) 4 MG/ML injection 4 mg (4 mg Intravenous Given 09/02/22 0943)  diphenhydrAMINE (BENADRYL) injection 25 mg (25 mg Intravenous Given 09/02/22 1158)    ED Course/ Medical Decision Making/ A&P                           Medical Decision Making Amount and/or Complexity of Data Reviewed Labs: ordered. Radiology: ordered.  Risk Prescription drug management.   This patient presents to the ED for concern of sob, this involves  an extensive number of treatment options, and is a complaint that carries with it a high risk of complications and morbidity.  The differential diagnosis includes uri, pna, pulm edema   Co morbidities that complicate the patient evaluation  ESRD on HD (T, TH, and Sat), HTN, Depression, Lupus, PE, and GERD   Additional history obtained:  Additional history obtained from epic chart review   Lab Tests:  I Ordered, and personally interpreted labs.  The pertinent results include:  cbc with hgb 10.9 (14.1 2 weeks ago, but she's been in this range several times recently); K slightly elevated 5.6; bun 112 and cr 17.51 (chronic)   Imaging Studies ordered:  I ordered imaging studies including CXR  I independently visualized and interpreted imaging which showed  IMPRESSION:  Mild pulmonary edema/fluid overload with small bibasilar effusions.   I agree with the radiologist interpretation   Cardiac Monitoring:  The patient was maintained on a cardiac monitor.  I personally viewed and interpreted the cardiac monitored which showed an underlying rhythm of: nsr   Medicines ordered and prescription drug management:  I ordered medication including morphine and zofran  for pain and nausea + normal home bp meds (amlodipine, carvedilol, hydralazine, and clonidine) Reevaluation of the patient after these medicines showed that the patient improved I have reviewed the patients home medicines and have made adjustments as needed   Critical Interventions:  dialysis   Consultations Obtained:  I requested consultation with the nephrologist (Dr. Moshe Cipro),  and discussed lab and imaging findings as well as pertinent plan -she arranged to take pt to dialysis   Problem List / ED Course:  ESRD on HD with multiple missed sessions:  mild pulm edema on cxr.  No oxygen requirement.  Mild hyperkalemia.  Nephrology will dialyze. HTN:  pt given her normal home meds and bp is going down by the  time  she went to dialysis   Reevaluation:  After the interventions noted above, I reevaluated the patient and found that they have :improved   Social Determinants of Health:  Lives at home with 3 kids.  No significant other.  She does not have adequate childcare and has poor social support.   Dispostion:  After consideration of the diagnostic results and the patients response to treatment, I feel that the patent would benefit from dialysis and likely d/c with f/u on Thurs.        Final Clinical Impression(s) / ED Diagnoses Final diagnoses:  ESRD on hemodialysis ([place])  Hypertension, unspecified type  Hyperkalemia    Rx / DC Orders ED Discharge Orders     None         Isla Pence, MD 09/02/22 1233

## 2022-09-02 NOTE — Progress Notes (Signed)
Received patient in bed to unit.  Alert and oriented.  Informed consent signed and in chart.   Treatment initiated: 1140 Treatment completed: 1540  Patient tolerated well.  Transported back to the room  Alert, without acute distress.  Hand-off given to patient's nurse.   Access used: caetheter Access issues: lines reversed  Total UF removed: 3.5 L Medication(s) given: Benadryl 25 mg Post HD VS: 190/106 P 66 R 20 O2 sat 100 in room air. Post HD weight: 60.5 kg   [facility]

## 2022-09-02 NOTE — ED Triage Notes (Signed)
Pt arrived from home with complaints of sob, n/v. Pt has dialysis T/Th/Sat and has not been to her past two sessions.

## 2022-09-02 NOTE — ED Provider Notes (Signed)
Pt back from dialysis and is feeling better.  She is stable for d/c.  F/u with dialysis on 10/5.   Isla Pence, MD 09/02/22 1600

## 2022-09-04 DIAGNOSIS — Z7689 Persons encountering health services in other specified circumstances: Secondary | ICD-10-CM | POA: Diagnosis not present

## 2022-09-04 DIAGNOSIS — Z992 Dependence on renal dialysis: Secondary | ICD-10-CM | POA: Diagnosis not present

## 2022-09-04 DIAGNOSIS — N2581 Secondary hyperparathyroidism of renal origin: Secondary | ICD-10-CM | POA: Diagnosis not present

## 2022-09-04 DIAGNOSIS — N186 End stage renal disease: Secondary | ICD-10-CM | POA: Diagnosis not present

## 2022-09-04 LAB — HEPATITIS B SURFACE ANTIBODY, QUANTITATIVE: Hep B S AB Quant (Post): 144.9 m[IU]/mL (ref >9.9)

## 2022-09-06 DIAGNOSIS — Z7689 Persons encountering health services in other specified circumstances: Secondary | ICD-10-CM | POA: Diagnosis not present

## 2022-09-06 DIAGNOSIS — N186 End stage renal disease: Secondary | ICD-10-CM | POA: Diagnosis not present

## 2022-09-06 DIAGNOSIS — N2581 Secondary hyperparathyroidism of renal origin: Secondary | ICD-10-CM | POA: Diagnosis not present

## 2022-09-06 DIAGNOSIS — Z992 Dependence on renal dialysis: Secondary | ICD-10-CM | POA: Diagnosis not present

## 2022-09-11 DIAGNOSIS — Z992 Dependence on renal dialysis: Secondary | ICD-10-CM | POA: Diagnosis not present

## 2022-09-11 DIAGNOSIS — N186 End stage renal disease: Secondary | ICD-10-CM | POA: Diagnosis not present

## 2022-09-11 DIAGNOSIS — Z7689 Persons encountering health services in other specified circumstances: Secondary | ICD-10-CM | POA: Diagnosis not present

## 2022-09-11 DIAGNOSIS — N2581 Secondary hyperparathyroidism of renal origin: Secondary | ICD-10-CM | POA: Diagnosis not present

## 2022-09-13 DIAGNOSIS — Z7689 Persons encountering health services in other specified circumstances: Secondary | ICD-10-CM | POA: Diagnosis not present

## 2022-09-13 DIAGNOSIS — Z992 Dependence on renal dialysis: Secondary | ICD-10-CM | POA: Diagnosis not present

## 2022-09-13 DIAGNOSIS — N186 End stage renal disease: Secondary | ICD-10-CM | POA: Diagnosis not present

## 2022-09-13 DIAGNOSIS — N2581 Secondary hyperparathyroidism of renal origin: Secondary | ICD-10-CM | POA: Diagnosis not present

## 2022-09-16 DIAGNOSIS — N186 End stage renal disease: Secondary | ICD-10-CM | POA: Diagnosis not present

## 2022-09-16 DIAGNOSIS — Z7689 Persons encountering health services in other specified circumstances: Secondary | ICD-10-CM | POA: Diagnosis not present

## 2022-09-16 DIAGNOSIS — N2581 Secondary hyperparathyroidism of renal origin: Secondary | ICD-10-CM | POA: Diagnosis not present

## 2022-09-16 DIAGNOSIS — Z992 Dependence on renal dialysis: Secondary | ICD-10-CM | POA: Diagnosis not present

## 2022-09-17 ENCOUNTER — Encounter: Payer: Medicare HMO | Admitting: Vascular Surgery

## 2022-09-17 ENCOUNTER — Ambulatory Visit: Payer: Medicare HMO | Admitting: Gastroenterology

## 2022-09-17 NOTE — Progress Notes (Deleted)
GI Office Note    Referring Provider: No ref. provider found Primary Care Physician:  Pcp, No  Primary Gastroenterologist:  Chief Complaint   No chief complaint on file.   History of Present Illness   Cathy [name] is a 41 y.o. female presenting today for hospital follow-up.  Presented with epigastric/right upper quadrant pain.  Also complained of chronic reflux, dysphagia, intermittent nausea and vomiting usually occurring all day she does not have dialysis.  CT abdomen pelvis with contrast showed nonspecific diffuse periportal edema and enhancement throughout the liver, probable hepatic cirrhosis, multiple tiny subcentimeter hypervascular foci throughout the liver, differential including cirrhotic nodules or HCC.  Mild to moderate ascites and diffuse mesenteric edema also noted.  Prior CTs as recent as of April 2023 showed no focal liver abnormalities.  Attempted paracentesis but insufficient fluid present.  MRI/MRCP abdomen without contrast completed with suggestion of iron deposition in the liver and spleen, no focal hepatic lesion, no biliary abnormalities.  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  aFP within normal limits.  She has had several missed dialysis sessions in the past several weeks requiring hospitalization.  Please arrange for iron panel with ferritin and hemochromatosis DNA. Dx: Abnormal MRI liver, abnormal MRI spleen, elevated ferritin.  AMA  Labs from September 02, 2022: HCV antibody nonreactive, hepatitis B core total antibody nonreactive, hepatitis B surface antibody positive, hepatitis B surface antigen negative, white blood cell count 5800, hemoglobin 10.9, hematocrit 34.4, platelets 222,000, creatinine 17.51, potassium 5.6, trop I 51.  Labs from September 2023: AFP 3.8, hemoglobin  12.1, platelets 151,000, total bilirubin 0.8, alkaline phosphatase 208, AST 27, ALT 32, albumin 3.4  Medications   Current Outpatient Medications  Medication Sig Dispense Refill   acetaminophen (TYLENOL) 500 MG tablet Take 1,000 mg by mouth every 6 (six) hours as needed for moderate pain.     albuterol (VENTOLIN HFA) 108 (90 Base) MCG/ACT inhaler Inhale 2 puffs into the lungs every 6 (six) hours as needed for wheezing or shortness of breath. 8 g 1   amLODipine (NORVASC) 5 MG tablet Take 1 tablet (5 mg total) by mouth daily. 30 tablet 0   apixaban (ELIQUIS) 5 MG TABS tablet Take 1 tablet (5 mg total) by mouth daily. (Patient taking differently: Take 5 mg by mouth 2 (two) times daily.) 30 tablet 0   B Complex-C-Folic Acid (DIALYVITE 024) 0.8 MG TABS Take 1 tablet by mouth daily.     Blood Pressure Monitoring (OMRON 3 SERIES BP MONITOR) DEVI Use as directed 2 times a day 1 each 0   calcium acetate (PHOSLO) 667 MG capsule Take 2 capsules (1,334 mg total) by mouth 3 (three) times daily with meals. (Patient not taking: Reported on 09/02/2022) 180 capsule 1   carvedilol (COREG) 25 MG tablet Take 1 tablet (25 mg total) by mouth 2 (two) times daily with a meal. 60 tablet 1   cloNIDine (CATAPRES) 0.2 MG tablet Take 1 tablet (0.2 mg total) by mouth every 8 (eight) hours. (Patient not taking: Reported on 09/02/2022) 90 tablet 1   cloNIDine (CATAPRES) 0.3 MG tablet Take 0.3 mg by mouth 3 (three) times daily.     Doxercalciferol (HECTOROL IV) Doxercalciferol (Hectorol) (Patient not taking: Reported on 08/13/2022)     hydrALAZINE (APRESOLINE) 100 MG tablet Take 1 tablet (100 mg  total) by mouth 3 (three) times daily. 90 tablet 1   ipratropium (ATROVENT HFA) 17 MCG/ACT inhaler Inhale 2 puffs into the lungs every 6 (six) hours as needed for wheezing. (Patient taking differently: Inhale 2 puffs into the lungs at bedtime.) 1 each 12   losartan (COZAAR) 100 MG tablet Take 1 tablet (100 mg total) by mouth at bedtime.  (Patient not taking: Reported on 09/02/2022) 30 tablet 1   losartan (COZAAR) 50 MG tablet Take 50 mg by mouth at bedtime.     pantoprazole (PROTONIX) 40 MG tablet Take 1 tablet (40 mg total) by mouth daily before breakfast. 30 tablet 0   sevelamer carbonate (RENVELA) 800 MG tablet Take 800 mg by mouth 3 (three) times daily.     No current facility-administered medications for this visit.    Allergies   Allergies as of 09/17/2022 - Review Complete 09/02/2022  Allergen Reaction Noted   Oxycodone Itching 02/03/2022     Past Medical History   Past Medical History:  Diagnosis Date   Anasarca associated with disorder of kidney 06/08/2021   Anxiety    Asthma    Bipolar depression ([place])    Depression    Dyspnea    ESRD (end stage renal disease) on dialysis ([place]) 10/2020   GERD (gastroesophageal reflux disease)    Gonorrhea    Lupus ([facility])    Pelvic inflammatory disease (PID)    Pleural effusion 06/08/2021   Pulmonary embolism ([place]) 01/31/2022   Renal hypertension    Trichomonas infection     Past Surgical History   Past Surgical History:  Procedure Laterality Date   AV FISTULA PLACEMENT Left 06/10/2022   Procedure: LEFT ARM ARTERIOVENOUS (AV) FISTULA CREATION;  Surgeon: [name], MD;  Location: [facility];  Service: Vascular;  Laterality: Left;   IR FLUORO GUIDE CV LINE RIGHT  11/30/2020   IR FLUORO GUIDE CV LINE RIGHT  06/13/2021   IR FLUORO GUIDE CV LINE RIGHT  02/01/2022   IR REMOVAL TUN CV CATH W/O FL  06/11/2021   IR REMOVAL TUN CV CATH W/O FL  01/29/2022   IR US GUIDE VASC ACCESS RIGHT  11/30/2020   IR US GUIDE VASC ACCESS RIGHT  06/13/2021   IR US GUIDE VASC ACCESS RIGHT  02/03/2022   RENAL BIOPSY     TEE WITHOUT CARDIOVERSION N/A 06/13/2021   Procedure: TRANSESOPHAGEAL ECHOCARDIOGRAM (TEE);  Surgeon: Josue Hector, MD;  Location: [facility] ENDOSCOPY;  Service: Cardiovascular;  Laterality: N/A;   TEE WITHOUT CARDIOVERSION N/A 02/03/2022   Procedure: TRANSESOPHAGEAL ECHOCARDIOGRAM  (TEE);  Surgeon: Berniece Salines, DO;  Location: [place];  Service: Cardiovascular;  Laterality: N/A;   TUBAL LIGATION N/A 02/03/2019   Procedure: POST PARTUM TUBAL LIGATION;  Surgeon: Aletha Halim, MD;  Location: [facility];  Service: Gynecology;  Laterality: N/A;    Past Family History   Family History  Problem Relation Age of Onset   Diabetes Maternal Grandmother    Hypertension Maternal Grandmother    Diabetes Maternal Grandfather    Hypertension Maternal Grandfather    Diabetes Paternal Grandmother    Hypertension Paternal Grandmother    Diabetes Paternal Grandfather    Hypertension Paternal Grandfather     Past Social History   Social History   Socioeconomic History   Marital status: Single    Spouse name: Not on file   Number of children: Not on file   Years of education: Not on file   Highest education level: Not on file  Occupational History   Occupation: unemployed  Tobacco Use   Smoking status: Some Days    Types: Cigarettes   Smokeless tobacco: Never   Tobacco comments:    1-2 cigarettes daily   Vaping Use   Vaping Use: Never used  Substance and Sexual Activity   Alcohol use: Not Currently   Drug use: Not Currently    Types: Marijuana    Comment: 3 times a week    Sexual activity: Not Currently    Birth control/protection: None  Other Topics Concern   Not on file  Social History Narrative   Not on file   Social Determinants of Health   Financial Resource Strain: Not on file  Food Insecurity: Food Insecurity Present (08/09/2022)   Hunger Vital Sign    Worried About Running Out of Food in the Last Year: Never true    Ran Out of Food in the Last Year: Often true  Transportation Needs: Unmet Transportation Needs (08/09/2022)   PRAPARE - Hydrologist (Medical): Yes    Lack of Transportation (Non-Medical): Yes  Physical Activity: Not on file  Stress: Not on file  Social Connections: Not on file  Intimate Partner  Violence: Not At Risk (08/09/2022)   Humiliation, Afraid, Rape, and Kick questionnaire    Fear of Current or Ex-Partner: No    Emotionally Abused: No    Physically Abused: No    Sexually Abused: No    Review of Systems   General: Negative for anorexia, weight loss, fever, chills, fatigue, weakness. ENT: Negative for hoarseness, difficulty swallowing , nasal congestion. CV: Negative for chest pain, angina, palpitations, dyspnea on exertion, peripheral edema.  Respiratory: Negative for dyspnea at rest, dyspnea on exertion, cough, sputum, wheezing.  GI: See history of present illness. GU:  Negative for dysuria, hematuria, urinary incontinence, urinary frequency, nocturnal urination.  Endo: Negative for unusual weight change.     Physical Exam   There were no vitals taken for this visit.   General: Well-nourished, well-developed in no acute distress.  Eyes: No icterus. Mouth: Oropharyngeal mucosa moist and pink , no lesions erythema or exudate. Lungs: Clear to auscultation bilaterally.  Heart: Regular rate and rhythm, no murmurs rubs or gallops.  Abdomen: Bowel sounds are normal, nontender, nondistended, no hepatosplenomegaly or masses,  no abdominal bruits or hernia , no rebound or guarding.  Rectal: ***  Extremities: No lower extremity edema. No clubbing or deformities. Neuro: Alert and oriented x 4   Skin: Warm and dry, no jaundice.   Psych: Alert and cooperative, normal mood and affect.  Labs   *** Imaging Studies   DG Chest Portable 1 View  Result Date: 09/02/2022 CLINICAL DATA:  End-stage renal disease on hemodialysis, has not been to last to dialysis sessions EXAM: PORTABLE CHEST 1 VIEW COMPARISON:  Portable exam 0846 hours compared 08/13/2022 FINDINGS: RIGHT jugular dual-lumen central venous catheter with tip projecting over SVC. Enlargement of cardiac silhouette with pulmonary vascular congestion. Minimal interstitial infiltrate consistent with pulmonary edema/fluid  overload. Small bibasilar effusions. No pneumothorax or acute osseous findings. IMPRESSION: Mild pulmonary edema/fluid overload with small bibasilar effusions. Electronically Signed   By: Lavonia Dana M.D.   On: 09/02/2022 08:53    Assessment       PLAN   ***   Laureen Ochs. Bobby Rumpf, [place], [facility]

## 2022-09-20 DIAGNOSIS — N2581 Secondary hyperparathyroidism of renal origin: Secondary | ICD-10-CM | POA: Diagnosis not present

## 2022-09-20 DIAGNOSIS — Z992 Dependence on renal dialysis: Secondary | ICD-10-CM | POA: Diagnosis not present

## 2022-09-20 DIAGNOSIS — Z7689 Persons encountering health services in other specified circumstances: Secondary | ICD-10-CM | POA: Diagnosis not present

## 2022-09-20 DIAGNOSIS — N186 End stage renal disease: Secondary | ICD-10-CM | POA: Diagnosis not present

## 2022-09-25 DIAGNOSIS — Z7689 Persons encountering health services in other specified circumstances: Secondary | ICD-10-CM | POA: Diagnosis not present

## 2022-09-25 DIAGNOSIS — N186 End stage renal disease: Secondary | ICD-10-CM | POA: Diagnosis not present

## 2022-09-25 DIAGNOSIS — N2581 Secondary hyperparathyroidism of renal origin: Secondary | ICD-10-CM | POA: Diagnosis not present

## 2022-09-25 DIAGNOSIS — Z992 Dependence on renal dialysis: Secondary | ICD-10-CM | POA: Diagnosis not present

## 2022-09-27 DIAGNOSIS — N2581 Secondary hyperparathyroidism of renal origin: Secondary | ICD-10-CM | POA: Diagnosis not present

## 2022-09-27 DIAGNOSIS — Z992 Dependence on renal dialysis: Secondary | ICD-10-CM | POA: Diagnosis not present

## 2022-09-27 DIAGNOSIS — Z7689 Persons encountering health services in other specified circumstances: Secondary | ICD-10-CM | POA: Diagnosis not present

## 2022-09-27 DIAGNOSIS — N186 End stage renal disease: Secondary | ICD-10-CM | POA: Diagnosis not present

## 2022-09-30 DIAGNOSIS — Z992 Dependence on renal dialysis: Secondary | ICD-10-CM | POA: Diagnosis not present

## 2022-09-30 DIAGNOSIS — M3214 Glomerular disease in systemic lupus erythematosus: Secondary | ICD-10-CM | POA: Diagnosis not present

## 2022-09-30 DIAGNOSIS — N186 End stage renal disease: Secondary | ICD-10-CM | POA: Diagnosis not present

## 2022-10-01 NOTE — Telephone Encounter (Signed)
Closing encounter

## 2022-10-02 DIAGNOSIS — Z7689 Persons encountering health services in other specified circumstances: Secondary | ICD-10-CM | POA: Diagnosis not present

## 2022-10-02 DIAGNOSIS — N186 End stage renal disease: Secondary | ICD-10-CM | POA: Diagnosis not present

## 2022-10-02 DIAGNOSIS — N2581 Secondary hyperparathyroidism of renal origin: Secondary | ICD-10-CM | POA: Diagnosis not present

## 2022-10-02 DIAGNOSIS — Z992 Dependence on renal dialysis: Secondary | ICD-10-CM | POA: Diagnosis not present

## 2022-10-04 DIAGNOSIS — N186 End stage renal disease: Secondary | ICD-10-CM | POA: Diagnosis not present

## 2022-10-04 DIAGNOSIS — Z992 Dependence on renal dialysis: Secondary | ICD-10-CM | POA: Diagnosis not present

## 2022-10-04 DIAGNOSIS — N2581 Secondary hyperparathyroidism of renal origin: Secondary | ICD-10-CM | POA: Diagnosis not present

## 2022-10-04 DIAGNOSIS — Z7689 Persons encountering health services in other specified circumstances: Secondary | ICD-10-CM | POA: Diagnosis not present

## 2022-10-07 DIAGNOSIS — N2581 Secondary hyperparathyroidism of renal origin: Secondary | ICD-10-CM | POA: Diagnosis not present

## 2022-10-07 DIAGNOSIS — Z992 Dependence on renal dialysis: Secondary | ICD-10-CM | POA: Diagnosis not present

## 2022-10-07 DIAGNOSIS — N186 End stage renal disease: Secondary | ICD-10-CM | POA: Diagnosis not present

## 2022-10-07 DIAGNOSIS — Z7689 Persons encountering health services in other specified circumstances: Secondary | ICD-10-CM | POA: Diagnosis not present

## 2022-10-11 DIAGNOSIS — Z7689 Persons encountering health services in other specified circumstances: Secondary | ICD-10-CM | POA: Diagnosis not present

## 2022-10-11 DIAGNOSIS — Z992 Dependence on renal dialysis: Secondary | ICD-10-CM | POA: Diagnosis not present

## 2022-10-11 DIAGNOSIS — N2581 Secondary hyperparathyroidism of renal origin: Secondary | ICD-10-CM | POA: Diagnosis not present

## 2022-10-11 DIAGNOSIS — N186 End stage renal disease: Secondary | ICD-10-CM | POA: Diagnosis not present

## 2022-10-14 DIAGNOSIS — Z7689 Persons encountering health services in other specified circumstances: Secondary | ICD-10-CM | POA: Diagnosis not present

## 2022-10-16 DIAGNOSIS — Z992 Dependence on renal dialysis: Secondary | ICD-10-CM | POA: Diagnosis not present

## 2022-10-16 DIAGNOSIS — N2581 Secondary hyperparathyroidism of renal origin: Secondary | ICD-10-CM | POA: Diagnosis not present

## 2022-10-16 DIAGNOSIS — N186 End stage renal disease: Secondary | ICD-10-CM | POA: Diagnosis not present

## 2022-10-16 DIAGNOSIS — Z7689 Persons encountering health services in other specified circumstances: Secondary | ICD-10-CM | POA: Diagnosis not present

## 2022-10-18 DIAGNOSIS — N2581 Secondary hyperparathyroidism of renal origin: Secondary | ICD-10-CM | POA: Diagnosis not present

## 2022-10-18 DIAGNOSIS — Z7689 Persons encountering health services in other specified circumstances: Secondary | ICD-10-CM | POA: Diagnosis not present

## 2022-10-18 DIAGNOSIS — N186 End stage renal disease: Secondary | ICD-10-CM | POA: Diagnosis not present

## 2022-10-18 DIAGNOSIS — Z992 Dependence on renal dialysis: Secondary | ICD-10-CM | POA: Diagnosis not present

## 2022-10-26 ENCOUNTER — Other Ambulatory Visit: Payer: Self-pay

## 2022-10-26 ENCOUNTER — Emergency Department (HOSPITAL_COMMUNITY): Payer: Medicare HMO

## 2022-10-26 ENCOUNTER — Encounter (HOSPITAL_COMMUNITY): Payer: Self-pay | Admitting: *Deleted

## 2022-10-26 ENCOUNTER — Observation Stay (HOSPITAL_COMMUNITY)
Admission: EM | Admit: 2022-10-26 | Discharge: 2022-10-27 | Disposition: A | Discharge status: Home / Self Care | Payer: Medicare HMO | Attending: Internal Medicine | Admitting: Internal Medicine

## 2022-10-26 DIAGNOSIS — F17210 Nicotine dependence, cigarettes, uncomplicated: Secondary | ICD-10-CM | POA: Insufficient documentation

## 2022-10-26 DIAGNOSIS — N186 End stage renal disease: Secondary | ICD-10-CM | POA: Diagnosis not present

## 2022-10-26 DIAGNOSIS — E875 Hyperkalemia: Principal | ICD-10-CM

## 2022-10-26 DIAGNOSIS — I132 Hypertensive heart and chronic kidney disease with heart failure and with stage 5 chronic kidney disease, or end stage renal disease: Secondary | ICD-10-CM | POA: Diagnosis not present

## 2022-10-26 DIAGNOSIS — K7460 Unspecified cirrhosis of liver: Secondary | ICD-10-CM | POA: Diagnosis not present

## 2022-10-26 DIAGNOSIS — J45909 Unspecified asthma, uncomplicated: Secondary | ICD-10-CM | POA: Diagnosis not present

## 2022-10-26 DIAGNOSIS — I2782 Chronic pulmonary embolism: Secondary | ICD-10-CM

## 2022-10-26 DIAGNOSIS — Z91158 Patient's noncompliance with renal dialysis for other reason: Secondary | ICD-10-CM | POA: Insufficient documentation

## 2022-10-26 DIAGNOSIS — I5043 Acute on chronic combined systolic (congestive) and diastolic (congestive) heart failure: Secondary | ICD-10-CM | POA: Insufficient documentation

## 2022-10-26 DIAGNOSIS — Z79899 Other long term (current) drug therapy: Secondary | ICD-10-CM | POA: Insufficient documentation

## 2022-10-26 DIAGNOSIS — R109 Unspecified abdominal pain: Secondary | ICD-10-CM | POA: Diagnosis not present

## 2022-10-26 DIAGNOSIS — I2699 Other pulmonary embolism without acute cor pulmonale: Secondary | ICD-10-CM | POA: Diagnosis present

## 2022-10-26 DIAGNOSIS — Z86711 Personal history of pulmonary embolism: Secondary | ICD-10-CM | POA: Insufficient documentation

## 2022-10-26 DIAGNOSIS — Z992 Dependence on renal dialysis: Secondary | ICD-10-CM

## 2022-10-26 DIAGNOSIS — E8770 Fluid overload, unspecified: Secondary | ICD-10-CM | POA: Diagnosis not present

## 2022-10-26 DIAGNOSIS — R197 Diarrhea, unspecified: Secondary | ICD-10-CM | POA: Insufficient documentation

## 2022-10-26 DIAGNOSIS — R112 Nausea with vomiting, unspecified: Secondary | ICD-10-CM

## 2022-10-26 DIAGNOSIS — I5023 Acute on chronic systolic (congestive) heart failure: Secondary | ICD-10-CM

## 2022-10-26 DIAGNOSIS — I10 Essential (primary) hypertension: Secondary | ICD-10-CM

## 2022-10-26 DIAGNOSIS — R188 Other ascites: Secondary | ICD-10-CM

## 2022-10-26 DIAGNOSIS — R0789 Other chest pain: Secondary | ICD-10-CM | POA: Diagnosis present

## 2022-10-26 DIAGNOSIS — Z7901 Long term (current) use of anticoagulants: Secondary | ICD-10-CM | POA: Diagnosis not present

## 2022-10-26 LAB — MAGNESIUM: Magnesium: 2.8 mg/dL — ABNORMAL HIGH (ref 1.7–2.4)

## 2022-10-26 LAB — CBC WITH DIFFERENTIAL/PLATELET
Abs Immature Granulocytes: 0.03 10*3/uL (ref 0.00–0.07)
Basophils Absolute: 0 10*3/uL (ref 0.0–0.1)
Basophils Relative: 1 %
Eosinophils Absolute: 0.2 10*3/uL (ref 0.0–0.5)
Eosinophils Relative: 3 %
HCT: 36.5 % (ref 36.0–46.0)
Hemoglobin: 11.4 g/dL — ABNORMAL LOW (ref 12.0–15.0)
Immature Granulocytes: 1 %
Lymphocytes Relative: 16 %
Lymphs Abs: 0.8 10*3/uL (ref 0.7–4.0)
MCH: 28.3 pg (ref 26.0–34.0)
MCHC: 31.2 g/dL (ref 30.0–36.0)
MCV: 90.6 fL (ref 80.0–100.0)
Monocytes Absolute: 0.5 10*3/uL (ref 0.1–1.0)
Monocytes Relative: 10 %
Neutro Abs: 3.3 10*3/uL (ref 1.7–7.7)
Neutrophils Relative %: 69 %
Platelets: 179 10*3/uL (ref 150–400)
RBC: 4.03 MIL/uL (ref 3.87–5.11)
RDW: 21.2 % — ABNORMAL HIGH (ref 11.5–15.5)
WBC: 4.8 10*3/uL (ref 4.0–10.5)
nRBC: 0 % (ref 0.0–0.2)

## 2022-10-26 LAB — BASIC METABOLIC PANEL
Anion gap: 17 — ABNORMAL HIGH (ref 5–15)
BUN: 101 mg/dL — ABNORMAL HIGH (ref 6–20)
CO2: 17 mmol/L — ABNORMAL LOW (ref 22–32)
Calcium: 7.6 mg/dL — ABNORMAL LOW (ref 8.9–10.3)
Chloride: 104 mmol/L (ref 98–111)
Creatinine, Ser: 19.37 mg/dL — ABNORMAL HIGH (ref 0.44–1.00)
GFR, Estimated: 2 mL/min — ABNORMAL LOW (ref >60)
Glucose, Bld: 92 mg/dL (ref 70–99)
Potassium: 7.1 mmol/L (ref 3.5–5.1)
Sodium: 138 mmol/L (ref 135–145)

## 2022-10-26 LAB — CBG MONITORING, ED
Glucose-Capillary: 85 mg/dL (ref 70–99)
Glucose-Capillary: 68 mg/dL — ABNORMAL LOW (ref 70–99)

## 2022-10-26 LAB — TROPONIN I (HIGH SENSITIVITY): Troponin I (High Sensitivity): 28 ng/L — ABNORMAL HIGH (ref <18)

## 2022-10-26 LAB — POTASSIUM
Potassium: 7.1 mmol/L (ref 3.5–5.1)
Potassium: 7.1 mmol/L (ref 3.5–5.1)

## 2022-10-26 LAB — I-STAT BETA HCG BLOOD, ED (MC, WL, AP ONLY): I-stat hCG, quantitative: <5 m[IU]/mL (ref <5)

## 2022-10-26 LAB — GLUCOSE, CAPILLARY: Glucose-Capillary: 78 mg/dL (ref 70–99)

## 2022-10-26 MED ORDER — SODIUM BICARBONATE 8.4 % IV SOLN: 50.0000 meq | Freq: Once | INTRAVENOUS | Status: AC

## 2022-10-26 MED ORDER — DEXTROSE 50 % IV SOLN
50.0000 mL | Freq: Once | INTRAVENOUS | Status: AC
  Administered 2022-10-26: 50 mL via INTRAVENOUS
  Filled 2022-10-26: qty 50

## 2022-10-26 MED ORDER — FUROSEMIDE 10 MG/ML IJ SOLN
40.0000 mg | Freq: Once | INTRAMUSCULAR | Status: AC
  Administered 2022-10-26: 40 mg via INTRAVENOUS
  Filled 2022-10-26: qty 4

## 2022-10-26 MED ORDER — SODIUM BICARBONATE 8.4 % IV SOLN
50.0000 meq | Freq: Once | INTRAVENOUS | Status: AC
  Administered 2022-10-26: 50 meq via INTRAVENOUS
  Filled 2022-10-26: qty 50

## 2022-10-26 MED ORDER — APIXABAN 5 MG PO TABS
5.0000 mg | ORAL_TABLET | Freq: Every day | ORAL | Status: DC
  Administered 2022-10-26 – 2022-10-27 (×2): 5 mg via ORAL
  Filled 2022-10-26 (×2): qty 1

## 2022-10-26 MED ORDER — DEXTROSE 50 % IV SOLN
INTRAVENOUS | Status: AC
  Filled 2022-10-26: qty 50

## 2022-10-26 MED ORDER — CHLORHEXIDINE GLUCONATE CLOTH 2 % EX PADS
6.0000 | MEDICATED_PAD | Freq: Every day | CUTANEOUS | Status: DC
  Administered 2022-10-27: 6 via TOPICAL

## 2022-10-26 MED ORDER — CALCIUM GLUCONATE 10 % IV SOLN
1.0000 g | Freq: Once | INTRAVENOUS | Status: AC
  Administered 2022-10-26: 1 g via INTRAVENOUS
  Filled 2022-10-26: qty 10

## 2022-10-26 MED ORDER — MORPHINE SULFATE (PF) 4 MG/ML IV SOLN
4.0000 mg | Freq: Once | INTRAVENOUS | Status: AC
  Administered 2022-10-26: 4 mg via INTRAVENOUS
  Filled 2022-10-26: qty 1

## 2022-10-26 MED ORDER — ACETAMINOPHEN 325 MG PO TABS: 650.0000 mg | ORAL_TABLET | Freq: Four times a day (QID) | ORAL | Status: DC | PRN

## 2022-10-26 MED ORDER — SODIUM ZIRCONIUM CYCLOSILICATE 5 G PO PACK
10.0000 g | PACK | Freq: Once | ORAL | Status: AC
  Administered 2022-10-26: 10 g via ORAL
  Filled 2022-10-26: qty 2

## 2022-10-26 MED ORDER — POLYETHYLENE GLYCOL 3350 17 G PO PACK: 17.0000 g | PACK | Freq: Every day | ORAL | Status: DC | PRN

## 2022-10-26 MED ORDER — AMLODIPINE BESYLATE 5 MG PO TABS
10.0000 mg | ORAL_TABLET | Freq: Every morning | ORAL | Status: DC
  Administered 2022-10-26 – 2022-10-27 (×2): 10 mg via ORAL
  Filled 2022-10-26 (×2): qty 2

## 2022-10-26 MED ORDER — CARVEDILOL 12.5 MG PO TABS
25.0000 mg | ORAL_TABLET | Freq: Two times a day (BID) | ORAL | Status: DC
  Administered 2022-10-26 – 2022-10-27 (×2): 25 mg via ORAL
  Filled 2022-10-26 (×2): qty 2

## 2022-10-26 MED ORDER — ONDANSETRON HCL 4 MG/2ML IJ SOLN: 4.0000 mg | Freq: Four times a day (QID) | INTRAMUSCULAR | Status: DC | PRN

## 2022-10-26 MED ORDER — AMLODIPINE BESYLATE 5 MG PO TABS: 10.0000 mg | ORAL_TABLET | Freq: Every morning | ORAL | Status: DC

## 2022-10-26 MED ORDER — DEXTROSE 50 % IV SOLN
25.0000 mL | Freq: Once | INTRAVENOUS | Status: AC
  Administered 2022-10-26: 25 mL via INTRAVENOUS
  Filled 2022-10-26: qty 50

## 2022-10-26 MED ORDER — ALBUTEROL SULFATE (2.5 MG/3ML) 0.083% IN NEBU
10.0000 mg | INHALATION_SOLUTION | Freq: Once | RESPIRATORY_TRACT | Status: AC
  Administered 2022-10-26: 10 mg via RESPIRATORY_TRACT
  Filled 2022-10-26: qty 12

## 2022-10-26 MED ORDER — ONDANSETRON HCL 4 MG/2ML IJ SOLN
4.0000 mg | Freq: Once | INTRAMUSCULAR | Status: AC
  Administered 2022-10-26: 4 mg via INTRAVENOUS
  Filled 2022-10-26: qty 2

## 2022-10-26 MED ORDER — DEXTROSE 50 % IV SOLN
1.0000 | Freq: Once | INTRAVENOUS | Status: AC
  Administered 2022-10-26: 50 mL via INTRAVENOUS
  Filled 2022-10-26: qty 50

## 2022-10-26 MED ORDER — HEPARIN SODIUM (PORCINE) 1000 UNIT/ML DIALYSIS: 1000.0000 [IU] | INTRAMUSCULAR | Status: DC | PRN

## 2022-10-26 MED ORDER — HEPARIN SODIUM (PORCINE) 5000 UNIT/ML IJ SOLN: 5000.0000 [IU] | Freq: Three times a day (TID) | INTRAMUSCULAR | Status: DC

## 2022-10-26 MED ORDER — HYDROMORPHONE HCL 1 MG/ML IJ SOLN
0.5000 mg | INTRAMUSCULAR | Status: DC | PRN
  Administered 2022-10-26 – 2022-10-27 (×3): 0.5 mg via INTRAVENOUS
  Filled 2022-10-26 (×3): qty 0.5

## 2022-10-26 MED ORDER — SODIUM POLYSTYRENE SULFONATE 15 GM/60ML PO SUSP
15.0000 g | Freq: Once | ORAL | Status: AC
  Administered 2022-10-26: 15 g via ORAL
  Filled 2022-10-26: qty 60

## 2022-10-26 MED ORDER — CHLORHEXIDINE GLUCONATE CLOTH 2 % EX PADS
6.0000 | MEDICATED_PAD | Freq: Every day | CUTANEOUS | Status: DC
  Administered 2022-10-26 – 2022-10-27 (×2): 6 via TOPICAL

## 2022-10-26 MED ORDER — INSULIN ASPART 100 UNIT/ML IJ SOLN
10.0000 [IU] | Freq: Once | INTRAMUSCULAR | Status: AC
  Administered 2022-10-26: 10 [IU] via SUBCUTANEOUS

## 2022-10-26 MED ORDER — ACETAMINOPHEN 650 MG RE SUPP: 650.0000 mg | Freq: Four times a day (QID) | RECTAL | Status: DC | PRN

## 2022-10-26 MED ORDER — SEVELAMER CARBONATE 800 MG PO TABS
800.0000 mg | ORAL_TABLET | Freq: Three times a day (TID) | ORAL | Status: DC
  Administered 2022-10-27 (×2): 800 mg via ORAL
  Filled 2022-10-26 (×2): qty 1

## 2022-10-26 MED ORDER — CLONIDINE HCL 0.3 MG/24HR TD PTWK
0.3000 mg | MEDICATED_PATCH | Freq: Once | TRANSDERMAL | Status: DC
  Administered 2022-10-26: 0.3 mg via TRANSDERMAL
  Filled 2022-10-26: qty 1

## 2022-10-26 MED ORDER — HYDRALAZINE HCL 20 MG/ML IJ SOLN
20.0000 mg | Freq: Once | INTRAMUSCULAR | Status: AC
  Administered 2022-10-26: 20 mg via INTRAVENOUS
  Filled 2022-10-26: qty 1

## 2022-10-26 MED ORDER — ALTEPLASE 2 MG IJ SOLR
2.0000 mg | Freq: Once | INTRAMUSCULAR | Status: AC | PRN
  Administered 2022-10-26: 4 mg

## 2022-10-26 MED ORDER — HYDRALAZINE HCL 25 MG PO TABS
100.0000 mg | ORAL_TABLET | Freq: Three times a day (TID) | ORAL | Status: DC
  Administered 2022-10-26 – 2022-10-27 (×2): 100 mg via ORAL
  Filled 2022-10-26: qty 4
  Filled 2022-10-26 (×9): qty 2

## 2022-10-26 MED ORDER — SODIUM ZIRCONIUM CYCLOSILICATE 10 G PO PACK
10.0000 g | PACK | Freq: Once | ORAL | Status: AC
  Administered 2022-10-26: 10 g via ORAL
  Filled 2022-10-26: qty 1

## 2022-10-26 MED ORDER — INSULIN ASPART 100 UNIT/ML IV SOLN
5.0000 [IU] | Freq: Once | INTRAVENOUS | Status: AC
  Administered 2022-10-26: 5 [IU] via INTRAVENOUS

## 2022-10-26 MED ORDER — HYDROMORPHONE HCL 1 MG/ML IJ SOLN
0.5000 mg | Freq: Once | INTRAMUSCULAR | Status: AC
  Administered 2022-10-26: 0.5 mg via INTRAVENOUS
  Filled 2022-10-26: qty 0.5

## 2022-10-26 MED ORDER — HYDRALAZINE HCL 20 MG/ML IJ SOLN
10.0000 mg | INTRAMUSCULAR | Status: DC | PRN
  Administered 2022-10-27: 10 mg via INTRAVENOUS
  Filled 2022-10-26: qty 1

## 2022-10-26 MED ORDER — DEXTROSE 50 % IV SOLN: 50.0000 mL | Freq: Once | INTRAVENOUS | Status: DC

## 2022-10-26 MED ORDER — SODIUM CHLORIDE 0.9 % IV BOLUS
250.0000 mL | Freq: Once | INTRAVENOUS | Status: AC
  Administered 2022-10-26: 250 mL via INTRAVENOUS

## 2022-10-26 MED ORDER — SODIUM BICARBONATE 8.4 % IV SOLN
INTRAVENOUS | Status: AC
  Administered 2022-10-26: 50 meq via INTRAVENOUS
  Filled 2022-10-26: qty 50

## 2022-10-26 MED ORDER — ONDANSETRON HCL 4 MG PO TABS: 4.0000 mg | ORAL_TABLET | Freq: Four times a day (QID) | ORAL | Status: DC | PRN

## 2022-10-26 MED ORDER — LOSARTAN POTASSIUM 50 MG PO TABS: 50.0000 mg | ORAL_TABLET | Freq: Every day | ORAL | Status: DC

## 2022-10-26 NOTE — Assessment & Plan Note (Addendum)
-  Patient with Underlying history of connective tissue disease -Continue anticoagulation therapy with Eliquis. -Follow-up with rheumatology service recommended.

## 2022-10-26 NOTE — Assessment & Plan Note (Addendum)
-  Patient meeting criteria for hypertensive urgency -Continue current antihypertensive agents; hemodialysis to further assist with blood pressure control in the setting of fluid overload. -Low-sodium diet and medication compliance has been encouraged.

## 2022-10-26 NOTE — ED Provider Notes (Signed)
[facility] EMERGENCY DEPARTMENT Provider Note   CSN: [id number] Arrival date & time: 10/26/22  1420     History  Chief Complaint  Patient presents with   missed dialysis    Chest Pain    Cathy [name] is a 41 y.o. female with a history of end-stage renal disease on dialysis Tuesday Thursday Saturday schedule, history of lupus, GERD, asthma and a history of pulmonary embolism presenting for evaluation of multiple complaints, mostly associated with several missed dialysis sessions.  Her last dialysis was on November 18, stating she got confused with the holiday dialysis schedule changes, also had home problems preventing her from being able to get her dialysis.  She is here secondary to multiple complaints, reporting generalized body aches along with nausea, vomiting and diarrhea which has been present for the past 2 days.  She also describes a midsternal chest pressure.  She does endorse shortness of breath with exertion, she has had no documented fevers or chills.  She does make urine but states she feels dehydrated secondary to the vomiting and diarrhea.  She has generalized weakness.  She has not been able to take her home medications for the past 2 days as she has been unable to keep them down including her blood pressure medications.  The history is provided by the patient.       Home Medications Prior to Admission medications   Medication Sig Start Date End Date Taking? Authorizing Provider  acetaminophen (TYLENOL) 500 MG tablet Take 1,000 mg by mouth every 6 (six) hours as needed for moderate pain.   Yes [provider]  amLODipine (NORVASC) 10 MG tablet Take 10 mg by mouth in the morning.   Yes [provider]  carvedilol (COREG) 25 MG tablet Take 1 tablet (25 mg total) by mouth 2 (two) times daily with a meal. 03/05/22  Yes Tat, David, MD  cloNIDine (CATAPRES) 0.3 MG tablet Take 0.3 mg by mouth 3 (three) times daily. 08/19/22  Yes [provider]   guaiFENesin (MUCINEX PO) Take 2 tablets by mouth 2 (two) times daily as needed (congestion).   Yes [provider]  hydrALAZINE (APRESOLINE) 100 MG tablet Take 1 tablet (100 mg total) by mouth 3 (three) times daily. 03/05/22  Yes Tat, Shanon Brow, MD  ipratropium (ATROVENT HFA) 17 MCG/ACT inhaler Inhale 2 puffs into the lungs every 6 (six) hours as needed for wheezing. 12/17/21 12/17/22 Yes Rai, Ripudeep K, MD  losartan (COZAAR) 50 MG tablet Take 50 mg by mouth at bedtime. 08/19/22  Yes [provider]  sevelamer carbonate (RENVELA) 800 MG tablet Take 800 mg by mouth 3 (three) times daily with meals. 08/19/22  Yes [provider]  amLODipine (NORVASC) 5 MG tablet Take 1 tablet (5 mg total) by mouth daily. Patient not taking: Reported on 10/26/2022 08/13/22 12/06/22  Heath Lark D, DO  apixaban (ELIQUIS) 5 MG TABS tablet Take 1 tablet (5 mg total) by mouth daily. Patient not taking: Reported on 10/26/2022 08/12/22 12/06/22  Heath Lark D, DO  Blood Pressure Monitoring (OMRON 3 SERIES BP MONITOR) DEVI Use as directed 2 times a day 01/09/22   Shelly Coss, MD  calcium acetate (PHOSLO) 667 MG capsule Take 2 capsules (1,334 mg total) by mouth 3 (three) times daily with meals. Patient not taking: Reported on 09/02/2022 03/05/22   Orson Eva, MD  cloNIDine (CATAPRES) 0.2 MG tablet Take 1 tablet (0.2 mg total) by mouth every 8 (eight) hours. Patient not taking: Reported on 09/02/2022  03/05/22   Orson Eva, MD  losartan (COZAAR) 100 MG tablet Take 1 tablet (100 mg total) by mouth at bedtime. Patient not taking: Reported on 09/02/2022 03/05/22   Orson Eva, MD  pantoprazole (PROTONIX) 40 MG tablet Take 1 tablet (40 mg total) by mouth daily before breakfast. Patient not taking: Reported on 10/26/2022 08/12/22 12/06/22  Heath Lark D, DO  fenofibrate 160 MG tablet Take 1 tablet (160 mg total) by mouth daily. 12/31/20 01/20/21  Regalado, Jerald Kief A, MD      Allergies    Oxycontin [oxycodone] and Dialyvite  800 [nephro-vite]    Review of Systems   Review of Systems  Constitutional:  Positive for fatigue. Negative for chills and fever.  HENT:  Negative for congestion and sore throat.   Eyes: Negative.   Respiratory:  Positive for chest tightness and shortness of breath.   Cardiovascular:  Negative for chest pain.  Gastrointestinal:  Negative for abdominal pain, diarrhea, nausea and vomiting.  Genitourinary: Negative.  Negative for dysuria.       Patient states she does make urine, usually fairly regularly although not over the past several days that she believes she is dehydrated.  Musculoskeletal:  Negative for arthralgias, joint swelling and neck pain.  Skin: Negative.  Negative for rash and wound.  Neurological:  Positive for weakness. Negative for dizziness, light-headedness, numbness and headaches.  Psychiatric/Behavioral: Negative.    All other systems reviewed and are negative.   Physical Exam Updated Vital Signs BP (!) 211/108   Pulse 67   Temp (!) 97.5 F (36.4 C) (Oral)   Resp 19   Ht 5\' 7"  (1.702 m)   Wt 59 kg   SpO2 100%   BMI 20.36 kg/m  Physical Exam Vitals and nursing note reviewed.  Constitutional:      Appearance: She is well-developed.  HENT:     Head: Normocephalic and atraumatic.  Eyes:     Conjunctiva/sclera: Conjunctivae normal.  Neck:     Vascular: JVD present.  Cardiovascular:     Rate and Rhythm: Normal rate and regular rhythm.     Heart sounds: Normal heart sounds.  Pulmonary:     Effort: Pulmonary effort is normal. No respiratory distress.     Breath sounds: Examination of the right-lower field reveals rales. Examination of the left-lower field reveals rales. Rales present. No wheezing or rhonchi.  Abdominal:     General: Bowel sounds are normal. There is distension.     Palpations: Abdomen is soft.     Tenderness: There is no abdominal tenderness.  Musculoskeletal:        General: Normal range of motion.     Cervical back: Normal range of  motion.     Right lower leg: No edema.     Left lower leg: No edema.  Skin:    General: Skin is warm and dry.  Neurological:     Mental Status: She is alert.     ED Results / Procedures / Treatments   Labs (all labs ordered are listed, but only abnormal results are displayed) Labs Reviewed  CBC WITH DIFFERENTIAL/PLATELET - Abnormal; Notable for the following components:      Result Value   Hemoglobin 11.4 (*)    RDW 21.2 (*)    All other components within normal limits  BASIC METABOLIC PANEL - Abnormal; Notable for the following components:   Potassium 7.1 (*)    CO2 17 (*)    BUN 101 (*)    Creatinine, Ser  19.37 (*)    Calcium 7.6 (*)    GFR, Estimated 2 (*)    Anion gap 17 (*)    All other components within normal limits  MAGNESIUM - Abnormal; Notable for the following components:   Magnesium 2.8 (*)    All other components within normal limits  I-STAT CHEM 8, ED  CBG MONITORING, ED  I-STAT BETA HCG BLOOD, ED ([facility], [facility], [facility] ONLY)    EKG EKG Interpretation  Date/Time:  Sunday October 26 2022 14:54:53 EST Ventricular Rate:  64 PR Interval:  166 QRS Duration: 84 QT Interval:  462 QTC Calculation: 477 R Axis:   108 Text Interpretation: Sinus rhythm Left atrial enlargement Left ventricular hypertrophy Anterior infarct, old abnormal T waves - ST changes in avL not seen on this EKG Confirmed by Noemi Chapel [phone]) on 10/26/2022 3:04:44 PM  Radiology No results found.  Procedures Procedures    Medications Ordered in ED Medications  cloNIDine (CATAPRES - Dosed in mg/24 hr) patch 0.3 mg (has no administration in time range)  calcium gluconate inj 10% (1 g) URGENT USE ONLY! (1 g Intravenous Given 10/26/22 1452)  sodium zirconium cyclosilicate (LOKELMA) packet 10 g (10 g Oral Given 10/26/22 1511)  sodium chloride 0.9 % bolus 250 mL (0 mLs Intravenous Stopped 10/26/22 1634)  furosemide (LASIX) injection 40 mg (40 mg Intravenous Given 10/26/22 1505)  albuterol  (PROVENTIL) (2.5 MG/3ML) 0.083% nebulizer solution 10 mg (10 mg Nebulization Given 10/26/22 1519)  insulin aspart (novoLOG) injection 5 Units (5 Units Intravenous Given 10/26/22 1508)    And  dextrose 50 % solution 50 mL (50 mLs Intravenous Given 10/26/22 1509)  hydrALAZINE (APRESOLINE) injection 20 mg (20 mg Intravenous Given 10/26/22 1521)  ondansetron (ZOFRAN) injection 4 mg (4 mg Intravenous Given 10/26/22 1521)  morphine (PF) 4 MG/ML injection 4 mg (4 mg Intravenous Given 10/26/22 1520)  sodium bicarbonate injection 50 mEq (50 mEq Intravenous Given 10/26/22 1638)    ED Course/ Medical Decision Making/ A&P                           Medical Decision Making Patient having missed multiple dialysis sessions presenting with multiple complaints, generalized fatigue along with nausea vomiting, diarrhea, shortness of breath, also with complaints of chest pain.  She endorses fatigue and feels dehydrated with the vomiting and diarrhea.  She has been unable to keep down any p.o. medications for the past 2 days, presents very hypertensive.  Patient states last attempt to take any pills was this morning but promptly vomited them up.  Amount and/or Complexity of Data Reviewed Labs: ordered.    Details: Significant labs including a potassium was 7.1, she has a BUN of 101, creatinine of 19.37, she has a low calcium at 7.6, magnesium of 2.8.  Mild anemia with a hemoglobin 11.4. Discussion of management or test interpretation with external provider(s): Patient was discussed with Dr. Johnney Ou of nephrology including patient's lab results, specifically her potassium level of 7.1.  Patient is not in any respiratory distress, not requiring oxygen, her potassium is being addressed with insulin and glucose, calcium and Lokelma given.  Therefore she did not feel this patient needed emergent dialysis and we could continue to address her lab abnormalities here with plans for dialysis in the morning.  She would recommend  repeating a potassium level at the 4-hour mark.  Given her low CO2 and anion gap she would also recommend some bicarb.  Call placed to  hospitalist to discuss admission.  Risk OTC drugs. Prescription drug management.   CRITICAL CARE Performed by: [name] Total critical care time: 40 minutes Critical care time was exclusive of separately billable procedures and treating other patients. Critical care was necessary to treat or prevent imminent or life-threatening deterioration. Critical care was time spent personally by me on the following activities: development of treatment plan with patient and/or surrogate as well as nursing, discussions with consultants, evaluation of patient's response to treatment, examination of patient, obtaining history from patient or surrogate, ordering and performing treatments and interventions, ordering and review of laboratory studies, ordering and review of radiographic studies, pulse oximetry and re-evaluation of patient's condition.         Final Clinical Impression(s) / ED Diagnoses Final diagnoses:  Hyperkalemia  Nausea vomiting and diarrhea  Dialysis patient, noncompliant    Rx / DC Orders ED Discharge Orders     None         Landis Martins 10/26/22 1646    Noemi Chapel, MD 10/26/22 2055

## 2022-10-26 NOTE — Progress Notes (Signed)
Nephrology Note:   Contacted by ED for ESRD pt coming in with multiple symptoms after reportedly missing dialysis for 8d.  She is afebrile, hypertensive, on RA.  Labs with K 7.1, bicarb 17, BUN 101, WBC 4.8, Hb 11.4 Plt 179.  CXR with vascular congestion.  Initial EKG reported peaked T waves in some leads.  K was appropriately medically managed and plans for HD in am made.  However, repeat K still 7.1, confirmed on stat repeat.  Discussed with HD RN - will perform urgent HD bedside tonight for refractory hyperkalemia.  Full note to follow from nephrology in AM.  Please contact me with concerns.    Jannifer Hick MD [facility] Pager [phone]

## 2022-10-26 NOTE — Assessment & Plan Note (Addendum)
-  Chronic. Abdomen diffusely tender to barely any touch, soft.  Similar exam findings from prior admissions. -Presenting with vomiting and diarrhea, afebrile without leukocytosis.  Also reports chest pains-atypical, similar to prior chest pains when she missed dialysis.   -No acute ischemic changes appreciated on EKG or telemetry. -Continue as needed analgesics -Continue volume stabilization with hemodialysis.

## 2022-10-26 NOTE — Assessment & Plan Note (Addendum)
-  Due to Missed HD.  -Patient with history of systolic heart failure at baseline; last echo- TEE- 01/2022 EF of 35 to 40%, with global hypokinesis. -Low-sodium diet discussed with patient -Volume management with hemodialysis. -Outpatient follow-up with cardiology service recommended.

## 2022-10-26 NOTE — H&P (Addendum)
History and Physical    Cathy [name]:[id number] DOB: 06-03-1981 DOA: 10/26/2022  PCP: Pcp, No   Patient coming from: Home  I have personally briefly reviewed patient's old medical records in [place]  Chief Complaint: Missed HD, vomiting and diarrhea  HPI: Cathy [name] is a 41 y.o. female with medical history significant for ESRD on dialysis, noncompliance, PE, NASH, systolic and diastolic CHF, hypertension, SLE. Patient presented to the ED with complaints of nausea vomiting and diarrhea over the past 3 days.  Time of my evaluation, patient is complaining of pain, mildly agitated and not answering all questions.  Reports 2 episodes of vomiting today.  History of chronic pain, bloating abdominal pain is reporting diffuse abdominal pain today.  Also left-sided chest pain.  Chest pain is intermittent, not related to activity, reports several episodes a day.  Similar to prior chest pain episodes which is mostly chronic and occurs when she misses her dialysis.  No blood in vomitus or stool.  She still makes urine.  Her last HD session was 11/18.  Patient is on dialysis Tuesday Thursday Saturday, she reports she was told that there was a change in the schedule due to Thanksgiving holiday.  She reports she did not notice this until it was too late to go.  She reports her dialysis center was not open on Thanksgiving day or the day after.   ED Course: Temperature 97.5.  Heart rate 57-68.  Respiratory rate 11-29.  Blood pressure systolic 314-970.  O2 sats greater than 91% on room air.  X-ray showing cardiomegaly and pulmonary vascular congestion, left lower lobe atelectasis or consolidation. 250 mL of fluid given.  Sodium bicarbonate, calcium gluconate, Lokelma 10 mg, D50, hydralazine 20 mg, given. EDP talked to nephrology on-call Dr. Johnney Ou, recommended checking potassium every 4 hourly, giving hyperkalemia cocktail including Lokelma, can stay here with plans for HD in AM.  Review  of Systems: As per HPI all other systems reviewed and negative.  Past Medical History:  Diagnosis Date   Anasarca associated with disorder of kidney 06/08/2021   Anxiety    Asthma    Bipolar depression ([place])    Depression    Dyspnea    ESRD (end stage renal disease) on dialysis ([place]) 10/2020   GERD (gastroesophageal reflux disease)    Gonorrhea    Lupus (HCC)    Pelvic inflammatory disease (PID)    Pleural effusion 06/08/2021   Pulmonary embolism ([place]) 01/31/2022   Renal hypertension    Trichomonas infection     Past Surgical History:  Procedure Laterality Date   AV FISTULA PLACEMENT Left 06/10/2022   Procedure: LEFT ARM ARTERIOVENOUS (AV) FISTULA CREATION;  Surgeon: [name], MD;  Location: [facility];  Service: Vascular;  Laterality: Left;   IR FLUORO GUIDE CV LINE RIGHT  11/30/2020   IR FLUORO GUIDE CV LINE RIGHT  06/13/2021   IR FLUORO GUIDE CV LINE RIGHT  02/01/2022   IR REMOVAL TUN CV CATH W/O FL  06/11/2021   IR REMOVAL TUN CV CATH W/O FL  01/29/2022   IR US GUIDE VASC ACCESS RIGHT  11/30/2020   IR US GUIDE VASC ACCESS RIGHT  06/13/2021   IR US GUIDE VASC ACCESS RIGHT  02/03/2022   RENAL BIOPSY     TEE WITHOUT CARDIOVERSION N/A 06/13/2021   Procedure: TRANSESOPHAGEAL ECHOCARDIOGRAM (TEE);  Surgeon: Josue Hector, MD;  Location: [facility] ENDOSCOPY;  Service: Cardiovascular;  Laterality: N/A;   TEE WITHOUT CARDIOVERSION N/A 02/03/2022  Procedure: TRANSESOPHAGEAL ECHOCARDIOGRAM (TEE);  Surgeon: Berniece Salines, DO;  Location: [place] [facility];  Service: Cardiovascular;  Laterality: N/A;   TUBAL LIGATION N/A 02/03/2019   Procedure: POST PARTUM TUBAL LIGATION;  Surgeon: Aletha Halim, MD;  Location: [facility];  Service: Gynecology;  Laterality: N/A;     reports that she has been smoking cigarettes. She has never used smokeless tobacco. She reports that she does not currently use alcohol. She reports that she does not currently use drugs after having used the following drugs:  Marijuana.  Allergies  Allergen Reactions   Oxycontin [Oxycodone] Itching   Dialyvite 800 [Nephro-Vite] Nausea And Vomiting    Family History  Problem Relation Age of Onset   Diabetes Maternal Grandmother    Hypertension Maternal Grandmother    Diabetes Maternal Grandfather    Hypertension Maternal Grandfather    Diabetes Paternal Grandmother    Hypertension Paternal Grandmother    Diabetes Paternal Grandfather    Hypertension Paternal Grandfather     Prior to Admission medications   Medication Sig Start Date End Date Taking? Authorizing Provider  acetaminophen (TYLENOL) 500 MG tablet Take 1,000 mg by mouth every 6 (six) hours as needed for moderate pain.   Yes [provider]  amLODipine (NORVASC) 10 MG tablet Take 10 mg by mouth in the morning.   Yes [provider]  carvedilol (COREG) 25 MG tablet Take 1 tablet (25 mg total) by mouth 2 (two) times daily with a meal. 03/05/22  Yes Tat, David, MD  cloNIDine (CATAPRES) 0.3 MG tablet Take 0.3 mg by mouth 3 (three) times daily. 08/19/22  Yes [provider]  guaiFENesin (MUCINEX PO) Take 2 tablets by mouth 2 (two) times daily as needed (congestion).   Yes [provider]  hydrALAZINE (APRESOLINE) 100 MG tablet Take 1 tablet (100 mg total) by mouth 3 (three) times daily. 03/05/22  Yes Tat, Shanon Brow, MD  ipratropium (ATROVENT HFA) 17 MCG/ACT inhaler Inhale 2 puffs into the lungs every 6 (six) hours as needed for wheezing. 12/17/21 12/17/22 Yes Rai, Ripudeep K, MD  losartan (COZAAR) 50 MG tablet Take 50 mg by mouth at bedtime. 08/19/22  Yes [provider]  sevelamer carbonate (RENVELA) 800 MG tablet Take 800 mg by mouth 3 (three) times daily with meals. 08/19/22  Yes [provider]  amLODipine (NORVASC) 5 MG tablet Take 1 tablet (5 mg total) by mouth daily. Patient not taking: Reported on 10/26/2022 08/13/22 12/06/22  Heath Lark D, DO  apixaban (ELIQUIS) 5 MG TABS tablet Take 1 tablet (5 mg  total) by mouth daily. Patient not taking: Reported on 10/26/2022 08/12/22 12/06/22  Heath Lark D, DO  Blood Pressure Monitoring (OMRON 3 SERIES BP MONITOR) DEVI Use as directed 2 times a day 01/09/22   Shelly Coss, MD  calcium acetate (PHOSLO) 667 MG capsule Take 2 capsules (1,334 mg total) by mouth 3 (three) times daily with meals. Patient not taking: Reported on 09/02/2022 03/05/22   Orson Eva, MD  cloNIDine (CATAPRES) 0.2 MG tablet Take 1 tablet (0.2 mg total) by mouth every 8 (eight) hours. Patient not taking: Reported on 09/02/2022 03/05/22   Orson Eva, MD  losartan (COZAAR) 100 MG tablet Take 1 tablet (100 mg total) by mouth at bedtime. Patient not taking: Reported on 09/02/2022 03/05/22   Orson Eva, MD  pantoprazole (PROTONIX) 40 MG tablet Take 1 tablet (40 mg total) by mouth daily before breakfast. Patient not taking: Reported on 10/26/2022 08/12/22 12/06/22  Heath Lark D, DO  fenofibrate 160 MG tablet Take 1 tablet (160 mg total) by mouth daily. 12/31/20 01/20/21  Elmarie Shiley, MD    Physical Exam: Vitals:   10/26/22 1500 10/26/22 1515 10/26/22 1519 10/26/22 1530  BP: (!) 222/124 (!) 209/106  (!) 211/108  Pulse: 64 (!) 57  67  Resp: (!) 23 (!) 29  19  Temp:      TempSrc:      SpO2: 97% 99% 95% 100%  Weight:      Height:        Constitutional: Acutely and chronically ill-appearing, appears uncomfortable, agitated Vitals:   10/26/22 1500 10/26/22 1515 10/26/22 1519 10/26/22 1530  BP: (!) 222/124 (!) 209/106  (!) 211/108  Pulse: 64 (!) 57  67  Resp: (!) 23 (!) 29  19  Temp:      TempSrc:      SpO2: 97% 99% 95% 100%  Weight:      Height:       Eyes: PERRL, lids and conjunctivae normal ENMT: Mucous membranes are moist.  Neck: normal, supple, no masses, no thyromegaly Respiratory: clear to auscultation bilaterally, no wheezing, no crackles. Normal respiratory effort. No accessory muscle use.  Cardiovascular: Regular rate and rhythm, no murmurs / rubs / gallops. No  extremity edema.  Extremities warm. Abdomen: Distended, but soft, diffuse tenderness to even very light palpation,  -unable to perform deep palpation, due to patient discomfort.  Musculoskeletal: no clubbing / cyanosis. No joint deformity upper and lower extremities.  Skin: no rashes, lesions, ulcers. No induration Neurologic: No apparent cranial nerve abnormality, moving extremities spontaneously Psychiatric: Normal judgment and insight. Alert and oriented x 3. Normal mood.   Labs on Admission: I have personally reviewed following labs and imaging studies  CBC: Recent Labs  Lab 10/26/22 1438  WBC 4.8  NEUTROABS 3.3  HGB 11.4*  HCT 36.5  MCV 90.6  PLT 409   Basic Metabolic Panel: Recent Labs  Lab 10/26/22 1438  NA 138  K 7.1*  CL 104  CO2 17*  GLUCOSE 92  BUN 101*  CREATININE 19.37*  CALCIUM 7.6*  MG 2.8*   CBG: Recent Labs  Lab 10/26/22 1449  GLUCAP 85   Radiological Exams on Admission: No results found.  EKG: Independently reviewed.  Sinus rhythm at 64, QTc 477.  Mild T wave changes to lead 2, 3 aVF, peaked T waves in leads V4 through V6 old and unchanged compared to prior.  Assessment/Plan Principal Problem:   Acute hyperkalemia Active Problems:   ESRD on hemodialysis (HCC)   Volume overload   Benign essential HTN   Abdominal pain   History of pulmonary embolus   Cirrhosis of liver with ascites (HCC)  Assessment and Plan: * Acute hyperkalemia Due to missed HD.  Potassium 7.1.  Last HD 11/18.  EKG with mild T wave changes in in lead - 2, 3, aVF - To receive 2 doses of Lokelma- 10 mg -Monitor potassium closely -Calcium gluconate, sodium bicarbonate, NovoLog 5 units, D50 given. -Monitor CBG -Repeat EKG in the morning - EDP to Dr. Johnney Ou, okay to stay here, plan for HD in a.m. - Addendum-potassium still elevated at 7.1- 5 hours after 1st 10 mg Lokelma given.  She has received a second dose of Lokelma.  Will add Kayexalate 15 g x 1, sodium bicarb repeat,  NovoLog 10 units and D50- 50 mL x 1.   ESRD on hemodialysis ([place]) Missed 3 sessions of HD.  Reports changing HD schedule due to Thanksgiving holiday.  Hx of non compliance.  Reports dyspnea, vomiting, diarrhea.  Potassium elevated at 7.1.  BUN 101.  Serum bicarb 17, anion gap of 17.  Blood pressure up to 222 improved with IV hydralazine 20mg .  Chest x-ray showing pulmonary vascular congestion, cardiomegaly, left lower lobe atelectasis or consolidation, small right pleural effusion. -Per HD -Still makes urine, IV Lasix 40 mg x 2.   - EDP talked to nephrologist Dr. Johnney Ou, plan for HD in a.m. okay to stay at [facility], monitor K closely  Volume overload Due to Missed HD.  Last echo- TEE- 01/2022 EF of 35 to 40%, with global hypokinesis. - Per HD.  Benign essential HTN Elevated.  Blood pressure was 150s-222.  Reports she took her medication but has been vomiting. IV hydralazine 20 mg x 1 given in ED -Resume Norvasc 10 mg, clonidine 0.3 mg 3 times daily, carvedilol 25 mg twice daily -Continue with as needed hydralazine 10 mg for systolic greater than 438  Abdominal pain Chronic. Abdomen diffusely tender to barely any touch, soft.  Similar exam findings from prior admission by myself.  Presenting with vomiting and diarrhea, afebrile without leukocytosis.  Also reports chest pains-atypical, similar to prior chest pains when she missed dialysis.  EKG with mild T wave changes to lead to 3 aVF. -IV Dilaudid 0.5 every 4 hourly as needed -Bowel rest with clear liquid diet, advance as tolerated -Repeat EKG in a.m. -Trend troponin    History of pulmonary embolus She is supposed to be on Eliquis, she ran out of refills.  01/2022- diagnosed with PE-right lower lobe segmental sized pulmonary embolism.  Per admission note at the time- setting of PE appeared unprovoked, and considering her connective tissue disease history may need to be on long-term anticoagulation. -Resume Eliquis for now  Cirrhosis of liver  with ascites ([place]) Abdomen is distended, soft, markedly tender, this is not new. Similar exam findings on prior hospitalization. -Reassess after dialysis   DVT prophylaxis: ELiquis Code Status: Full code Family Communication: None at bedside Disposition Plan:~  2 days Consults called: Nephrology Admission status:  Obs stepdown    Author: Bethena Roys, MD 10/26/2022 10:03 PM  For on call review [email or web address].

## 2022-10-26 NOTE — Assessment & Plan Note (Addendum)
-  Chronic and secondary to NASH  -low-sodium diet discussed with patient -PPI daily added -Outpatient follow-up with gastroenterology recommended.

## 2022-10-26 NOTE — ED Triage Notes (Addendum)
Pt states she has missed a whole week of dialysis.  Last one was on 11/18.  + N/V/D, generalized body pain and mid CP

## 2022-10-26 NOTE — ED Notes (Signed)
Cranberry juice given at this time

## 2022-10-26 NOTE — Procedures (Signed)
    EMERGENT HEMODIALYSIS TREATMENT NOTE:   Persistent hyperkalemia despite medical management.  RIJ TDC was completely occluded pre-HD.  Entry site is unremarkable.  Activase instilled x 60 minutes to restore patency.  3 hour heparin-free treatment completed with first 30 minutes on low K dialysate.   Cartridge/dialyzer changed once for unstable TMP (wet transducer).  All blood was returned. Persistent hypertension despite removal of 3.5 liters.  Morning labs were r/s for 0530 to allow for equilibration - discussed with phlebotomist [place].  Hand-off given to Candise Bowens, RN.     Rockwell Alexandria, RN

## 2022-10-26 NOTE — Assessment & Plan Note (Addendum)
-  Patient missed 3 sessions of HD.  Reports changing HD schedule due to Thanksgiving holiday.  -She has hx of non compliance.   -At time of admission potassium elevated at 7.1, which failed to respond to medication therapy. -Emergent hemodialysis provided. -Repeat basic metabolic panel after hemodialysis demonstrating potassium of 3.7. -Patient still with signs of fluid overload; but hemodynamically stable and after discussing with nephrology service plan is for patient to be discharged with resumption of hemodialysis as an outpatient on 10/28/2022.

## 2022-10-26 NOTE — Assessment & Plan Note (Addendum)
-  Due to missed HD.  Potassium 7.1.  Last HD 11/18.  EKG with mild T wave changes in in lead - 2, 3, aVF at time of admission -Patient received Lokelma, calcium gluconate, sodium bicarbonate, and insulin -Despite treatment potassium remains elevated and emergent hemodialysis provided. -Current potassium level 3.7.

## 2022-10-27 DIAGNOSIS — Z992 Dependence on renal dialysis: Secondary | ICD-10-CM | POA: Diagnosis not present

## 2022-10-27 DIAGNOSIS — I160 Hypertensive urgency: Secondary | ICD-10-CM | POA: Diagnosis not present

## 2022-10-27 DIAGNOSIS — I2782 Chronic pulmonary embolism: Secondary | ICD-10-CM

## 2022-10-27 DIAGNOSIS — I5023 Acute on chronic systolic (congestive) heart failure: Secondary | ICD-10-CM | POA: Diagnosis not present

## 2022-10-27 DIAGNOSIS — E875 Hyperkalemia: Secondary | ICD-10-CM | POA: Diagnosis not present

## 2022-10-27 DIAGNOSIS — R188 Other ascites: Secondary | ICD-10-CM | POA: Diagnosis not present

## 2022-10-27 DIAGNOSIS — N186 End stage renal disease: Secondary | ICD-10-CM | POA: Diagnosis not present

## 2022-10-27 DIAGNOSIS — K7460 Unspecified cirrhosis of liver: Secondary | ICD-10-CM | POA: Diagnosis not present

## 2022-10-27 DIAGNOSIS — R109 Unspecified abdominal pain: Secondary | ICD-10-CM | POA: Diagnosis not present

## 2022-10-27 DIAGNOSIS — E8770 Fluid overload, unspecified: Secondary | ICD-10-CM | POA: Diagnosis not present

## 2022-10-27 DIAGNOSIS — Z86711 Personal history of pulmonary embolism: Secondary | ICD-10-CM | POA: Diagnosis not present

## 2022-10-27 LAB — MRSA NEXT GEN BY PCR, NASAL: MRSA by PCR Next Gen: NOT DETECTED

## 2022-10-27 LAB — BASIC METABOLIC PANEL
Anion gap: 16 — ABNORMAL HIGH (ref 5–15)
BUN: 52 mg/dL — ABNORMAL HIGH (ref 6–20)
CO2: 25 mmol/L (ref 22–32)
Calcium: 8.1 mg/dL — ABNORMAL LOW (ref 8.9–10.3)
Chloride: 96 mmol/L — ABNORMAL LOW (ref 98–111)
Creatinine, Ser: 10.88 mg/dL — ABNORMAL HIGH (ref 0.44–1.00)
GFR, Estimated: 4 mL/min — ABNORMAL LOW (ref >60)
Glucose, Bld: 97 mg/dL (ref 70–99)
Potassium: 3.7 mmol/L (ref 3.5–5.1)
Sodium: 137 mmol/L (ref 135–145)

## 2022-10-27 LAB — CBC
HCT: 41.6 % (ref 36.0–46.0)
Hemoglobin: 13.2 g/dL (ref 12.0–15.0)
MCH: 28.3 pg (ref 26.0–34.0)
MCHC: 31.7 g/dL (ref 30.0–36.0)
MCV: 89.3 fL (ref 80.0–100.0)
Platelets: 199 10*3/uL (ref 150–400)
RBC: 4.66 MIL/uL (ref 3.87–5.11)
RDW: 21.2 % — ABNORMAL HIGH (ref 11.5–15.5)
WBC: 6.8 10*3/uL (ref 4.0–10.5)
nRBC: 0 % (ref 0.0–0.2)

## 2022-10-27 LAB — GLUCOSE, CAPILLARY
Glucose-Capillary: 51 mg/dL — ABNORMAL LOW (ref 70–99)
Glucose-Capillary: 96 mg/dL (ref 70–99)
Glucose-Capillary: 94 mg/dL (ref 70–99)
Glucose-Capillary: 74 mg/dL (ref 70–99)

## 2022-10-27 LAB — HEPATITIS B SURFACE ANTIGEN: Hepatitis B Surface Ag: NONREACTIVE

## 2022-10-27 MED ORDER — DIPHENHYDRAMINE HCL 50 MG/ML IJ SOLN: 12.5000 mg | Freq: Once | INTRAMUSCULAR | Status: AC

## 2022-10-27 MED ORDER — APIXABAN 5 MG PO TABS: 5.0000 mg | ORAL_TABLET | Freq: Two times a day (BID) | ORAL | 3 refills | Status: DC

## 2022-10-27 MED ORDER — CLONIDINE HCL 0.3 MG PO TABS: 0.3000 mg | ORAL_TABLET | Freq: Three times a day (TID) | ORAL | 2 refills | Status: DC

## 2022-10-27 MED ORDER — AMLODIPINE BESYLATE 10 MG PO TABS: 10.0000 mg | ORAL_TABLET | Freq: Every morning | ORAL | 2 refills | Status: DC

## 2022-10-27 MED ORDER — LABETALOL HCL 5 MG/ML IV SOLN: 20.0000 mg | INTRAVENOUS | Status: DC | PRN

## 2022-10-27 MED ORDER — DIPHENHYDRAMINE HCL 50 MG/ML IJ SOLN
INTRAMUSCULAR | Status: AC
  Administered 2022-10-27: 12.5 mg via INTRAVENOUS
  Filled 2022-10-27: qty 1

## 2022-10-27 MED ORDER — HEPARIN SODIUM (PORCINE) 1000 UNIT/ML IJ SOLN
INTRAMUSCULAR | Status: AC
  Administered 2022-10-27: 3200 [IU]
  Filled 2022-10-27: qty 5

## 2022-10-27 MED ORDER — PANTOPRAZOLE SODIUM 40 MG PO TBEC: 40.0000 mg | DELAYED_RELEASE_TABLET | Freq: Every day | ORAL | 1 refills | Status: DC

## 2022-10-27 MED ORDER — DILTIAZEM HCL 25 MG/5ML IV SOLN
15.0000 mg | Freq: Once | INTRAVENOUS | Status: AC
  Administered 2022-10-27: 15 mg via INTRAVENOUS
  Filled 2022-10-27: qty 5

## 2022-10-27 NOTE — Progress Notes (Signed)
Patient ID: Cathy [name], female   DOB: 05-04-1981, 41 y.o.   MRN: [id number]  [facility] Progress Note    Subjective:    Feels better this morning and tolerated HD without issue last night.   Objective:   BP (!) 170/111 (BP Location: Right Arm)   Pulse 83   Temp 98.3 F (36.8 C) (Oral)   Resp 18   Ht 5\' 7"  (1.702 m)   Wt 62.6 kg   SpO2 100%   BMI 21.62 kg/m   Intake/Output: I/O last 3 completed shifts: In: 550 [P.O.:300; IV Piggyback:250] Out: 7017 [Urine:200; Other:3500]   Intake/Output this shift:  Total I/O In: 240 [P.O.:240] Out: -  Weight change:   Physical Exam: Gen: NAD CVS: RRR Resp:CTA Abd: +BS, soft, NT/ND Ext: no edema, left AVF +T/B  Labs: BMET Recent Labs  Lab 10/26/22 1438 10/26/22 2014 10/26/22 2148 10/27/22 0606  NA 138  --   --  137  K 7.1* 7.1* 7.1* 3.7  CL 104  --   --  96*  CO2 17*  --   --  25  GLUCOSE 92  --   --  97  BUN 101*  --   --  52*  CREATININE 19.37*  --   --  10.88*  CALCIUM 7.6*  --   --  8.1*   CBC Recent Labs  Lab 10/26/22 1438 10/27/22 0606  WBC 4.8 6.8  NEUTROABS 3.3  --   HGB 11.4* 13.2  HCT 36.5 41.6  MCV 90.6 89.3  PLT 179 199      Medications:     amLODipine  10 mg Oral q AM   apixaban  5 mg Oral Daily   carvedilol  25 mg Oral BID WC   Chlorhexidine Gluconate Cloth  6 each Topical Daily   Chlorhexidine Gluconate Cloth  6 each Topical Q0600   cloNIDine  0.3 mg Transdermal Once   hydrALAZINE  100 mg Oral TID   sevelamer carbonate  800 mg Oral TID WC   Dialyzes at [facility] TTS 4 hours, profile #2  EDW 58 kg. HD Bath 2/2, Dialyzer 180, Heparin no. Access TDC.  Hectorol 3 mcg  Assessment/ Plan:   Hyperkalemia - due to noncompliance with HD.  Improved with urgent HD last night. ESRD- will need to go to outpatient HD tomorrow to keep on her schedule. 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  Donetta Potts, MD [employer] 10/27/2022, 9:10 AM

## 2022-10-27 NOTE — Discharge Summary (Signed)
Physician Discharge Summary   Patient: Cathy [name] MRN: [id number] DOB: [birth year]  Admit date:     10/26/2022  Discharge date: 10/27/22  Discharge Physician: Barton Dubois   PCP: Pcp, No   Recommendations at discharge:  Repeat CBC to follow hemoglobin and Platelet count stability Repeat basic metabolic panel to follow electrolytes renal function Reassess blood pressure and adjust antihypertensive treatment as needed Continue assisting patient with compliance to treatment and medications.  Discharge Diagnoses: Principal Problem:   Acute hyperkalemia Active Problems:   ESRD on hemodialysis (HCC)   Volume overload   Benign essential HTN   Abdominal pain   History of pulmonary embolus   Cirrhosis of liver with ascites (HCC)   Acute on chronic clinical systolic heart failure (HCC)   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 cor pulmonale [facility])   Brief Hospital admission course: As per H&P written by Dr. Denton Brick on 10/26/2022 Cathy [name] is a 41 y.o. female with medical history significant for ESRD on dialysis, noncompliance, PE, NASH, systolic and diastolic CHF, hypertension, SLE. Patient presented to the ED with complaints of nausea vomiting and diarrhea over the past 3 days.  Time of my evaluation, patient is complaining of pain, mildly agitated and not answering all questions.  Reports 2 episodes of vomiting today.  History of chronic pain, bloating abdominal pain is reporting diffuse abdominal pain today.  Also left-sided chest pain.  Chest pain is intermittent, not related to activity, reports several episodes a day.  Similar to prior chest pain episodes which is mostly chronic and occurs when she misses her dialysis.  No blood in vomitus or stool.  She still makes urine.  Her last HD session was 11/18.   Patient is on dialysis Tuesday Thursday Saturday, she reports she was told that there was a change in the schedule due to Thanksgiving holiday.  She reports she did not  notice this until it was too late to go.  She reports her dialysis center was not open on Thanksgiving day or the day after.    ED Course: Temperature 97.5.  Heart rate 57-68.  Respiratory rate 11-29.  Blood pressure systolic 250-539.  O2 sats greater than 91% on room air.  X-ray showing cardiomegaly and pulmonary vascular congestion, left lower lobe atelectasis or consolidation. 250 mL of fluid given.  Sodium bicarbonate, calcium gluconate, Lokelma 10 mg, D50, hydralazine 20 mg, given. EDP talked to nephrology on-call Dr. Johnney Ou, recommended checking potassium every 4 hourly, giving hyperkalemia cocktail including Lokelma, can stay here with plans for HD in AM. Assessment and Plan: * Acute hyperkalemia -Due to missed HD.  Potassium 7.1.  Last HD 11/18.  EKG with mild T wave changes in in lead - 2, 3, aVF at time of admission -Patient received Lokelma, calcium gluconate, sodium bicarbonate, and insulin -Despite treatment potassium remains elevated and emergent hemodialysis provided. -Current potassium level 3.7.  ESRD on hemodialysis [facility]) -Patient missed 3 sessions of HD.  Reports changing HD schedule due to Thanksgiving holiday.  -She has hx of non compliance.   -At time of admission potassium elevated at 7.1, which failed to respond to medication therapy. -Emergent hemodialysis provided. -Repeat basic metabolic panel after hemodialysis demonstrating potassium of 3.7. -Patient still with signs of fluid overload; but hemodynamically stable and after discussing with nephrology service plan is for patient to be discharged with resumption of hemodialysis as an outpatient on 10/28/2022.   Volume overload -Due to Missed HD.  -Patient with history of systolic heart failure  at baseline; last echo- TEE- 01/2022 EF of 35 to 40%, with global hypokinesis. -Low-sodium diet discussed with patient -Volume management with hemodialysis. -Outpatient follow-up with cardiology service recommended.  Benign  essential HTN -Patient meeting criteria for hypertensive urgency -Continue current antihypertensive agents; hemodialysis to further assist with blood pressure control in the setting of fluid overload. -Low-sodium diet and medication compliance has been encouraged.  Abdominal pain -Chronic. Abdomen diffusely tender to barely any touch, soft.  Similar exam findings from prior admissions. -Presenting with vomiting and diarrhea, afebrile without leukocytosis.  Also reports chest pains-atypical, similar to prior chest pains when she missed dialysis.   -No acute ischemic changes appreciated on EKG or telemetry. -Continue as needed analgesics -Continue volume stabilization with hemodialysis.    History of pulmonary embolus -Patient with Underlying history of connective tissue disease -Continue anticoagulation therapy with Eliquis. -Follow-up with rheumatology service recommended.   Cirrhosis of liver with ascites (HCC) -Chronic and secondary to NASH  -low-sodium diet discussed with patient -PPI daily added -Outpatient follow-up with gastroenterology recommended.  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 cor pulmonale (HCC) -As mentioned; continue anticoagulation therapy using Eliquis.  Acute on chronic clinical systolic heart failure (HCC) -Daily weights, low-sodium diet and volume management with hemodialysis recommended. -Patient educated about the importance of treatment compliance. -Outpatient follow-up with cardiology encourage.   Consultants: Neurology service Procedures performed: See below for x-ray report; hemodialysis. Disposition: Home Diet recommendation: Heart healthy/low-sodium diet.  DISCHARGE MEDICATION: Allergies as of 10/27/2022       Reactions   Oxycontin [oxycodone] Itching   Dialyvite 800 [nephro-vite] Nausea And Vomiting        Medication List     STOP taking these medications    calcium acetate 667 MG capsule Commonly known as: PHOSLO   losartan  100 MG tablet Commonly known as: COZAAR   losartan 50 MG tablet Commonly known as: COZAAR       TAKE these medications    acetaminophen 500 MG tablet Commonly known as: TYLENOL Take 1,000 mg by mouth every 6 (six) hours as needed for moderate pain.   amLODipine 10 MG tablet Commonly known as: NORVASC Take 1 tablet (10 mg total) by mouth in the morning. What changed: Another medication with the same name was removed. Continue taking this medication, and follow the directions you see here.   apixaban 5 MG Tabs tablet Commonly known as: ELIQUIS Take 1 tablet (5 mg total) by mouth 2 (two) times daily. What changed: when to take this   carvedilol 25 MG tablet Commonly known as: COREG Take 1 tablet (25 mg total) by mouth 2 (two) times daily with a meal.   cloNIDine 0.3 MG tablet Commonly known as: CATAPRES Take 1 tablet (0.3 mg total) by mouth 3 (three) times daily. What changed: Another medication with the same name was removed. Continue taking this medication, and follow the directions you see here.   hydrALAZINE 100 MG tablet Commonly known as: APRESOLINE Take 1 tablet (100 mg total) by mouth 3 (three) times daily.   ipratropium 17 MCG/ACT inhaler Commonly known as: ATROVENT HFA Inhale 2 puffs into the lungs every 6 (six) hours as needed for wheezing.   MUCINEX PO Take 2 tablets by mouth 2 (two) times daily as needed (congestion).   Omron 3 Series BP Monitor Devi Use as directed 2 times a day   pantoprazole 40 MG tablet Commonly known as: PROTONIX Take 1 tablet (40 mg total) by mouth daily before breakfast.   sevelamer carbonate  800 MG tablet Commonly known as: RENVELA Take 800 mg by mouth 3 (three) times daily with meals.        Discharge Exam: Filed Weights   10/26/22 1819 10/27/22 0010 10/27/22 0448  Weight: 64.2 kg 64.2 kg 62.6 kg    General exam: Alert, awake, oriented x 3; no chest pain, no shortness of breath.  In no acute distress.  Patient is  afebrile. Respiratory system: Good saturation on room air; no using accessory muscles. Cardiovascular system:RRR. No rubs or gallops; no JVD on exam. Gastrointestinal system: Abdomen is mildly distended; currently nontender on palpation.  Positive bowel sounds appreciated.   Central nervous system: Alert and oriented. No focal neurological deficits. Extremities: No cyanosis or clubbing. Skin: No petechiae. Psychiatry: Judgement and insight appear normal. Mood & affect appropriate.   Condition at discharge: Stable and improved.  The results of significant diagnostics from this hospitalization (including imaging, microbiology, ancillary and laboratory) are listed below for reference.   Imaging Studies: DG Chest Portable 1 View  Result Date: 10/26/2022 CLINICAL DATA:  Shortness of breath EXAM: PORTABLE CHEST 1 VIEW COMPARISON:  Radiographs 09/02/2022 FINDINGS: Right IJ CVC tip in the right atrium. Cardiomegaly and mild pulmonary vascular congestion. Retrocardiac atelectasis/consolidation, unchanged from prior. Probable small right pleural effusion. No acute osseous abnormality. IMPRESSION: Cardiomegaly and pulmonary vascular congestion. Left lower lobe atelectasis or consolidation. Small right pleural effusion. Electronically Signed   By: Placido Sou M.D.   On: 10/26/2022 17:13    Microbiology: Results for orders placed or performed during the hospital encounter of 08/09/22  MRSA Next Gen by PCR, Nasal     Status: None   Collection Time: 08/09/22  8:43 PM   Specimen: Nasal Mucosa; Nasal Swab  Result Value Ref Range Status   MRSA by PCR Next Gen NOT DETECTED NOT DETECTED Final    Comment: (NOTE) The GeneXpert MRSA Assay (FDA approved for NASAL specimens only), is one component of a comprehensive MRSA colonization surveillance program. It is not intended to diagnose MRSA infection nor to guide or monitor treatment for MRSA infections. Test performance is not FDA approved in patients  less than 62 years old. Performed at [facility], [address] [place], [place] [address]     Labs: CBC: Recent Labs  Lab 10/26/22 1438 10/27/22 0606  WBC 4.8 6.8  NEUTROABS 3.3  --   HGB 11.4* 13.2  HCT 36.5 41.6  MCV 90.6 89.3  PLT 179 939   Basic Metabolic Panel: Recent Labs  Lab 10/26/22 1438 10/26/22 2014 10/26/22 2148 10/27/22 0606  NA 138  --   --  137  K 7.1* 7.1* 7.1* 3.7  CL 104  --   --  96*  CO2 17*  --   --  25  GLUCOSE 92  --   --  97  BUN 101*  --   --  52*  CREATININE 19.37*  --   --  10.88*  CALCIUM 7.6*  --   --  8.1*  MG 2.8*  --   --   --    CBG: Recent Labs  Lab 10/26/22 1818 10/27/22 0501 10/27/22 0552 10/27/22 0720 10/27/22 1107  GLUCAP 78 51* 96 94 74    Discharge time spent: greater than 30 minutes.  Signed: Barton Dubois, MD [facility] 10/27/2022

## 2022-10-27 NOTE — Assessment & Plan Note (Signed)
-  Daily weights, low-sodium diet and volume management with hemodialysis recommended. -Patient educated about the importance of treatment compliance. -Outpatient follow-up with cardiology encourage.

## 2022-10-27 NOTE — Assessment & Plan Note (Signed)
-  As mentioned; continue anticoagulation therapy using Eliquis.

## 2022-10-28 DIAGNOSIS — N2581 Secondary hyperparathyroidism of renal origin: Secondary | ICD-10-CM | POA: Diagnosis not present

## 2022-10-28 DIAGNOSIS — N186 End stage renal disease: Secondary | ICD-10-CM | POA: Diagnosis not present

## 2022-10-28 DIAGNOSIS — Z7689 Persons encountering health services in other specified circumstances: Secondary | ICD-10-CM | POA: Diagnosis not present

## 2022-10-28 DIAGNOSIS — Z992 Dependence on renal dialysis: Secondary | ICD-10-CM | POA: Diagnosis not present

## 2022-10-28 LAB — HEPATITIS B SURFACE ANTIBODY, QUANTITATIVE: Hep B S AB Quant (Post): 118.7 m[IU]/mL (ref >9.9)

## 2022-10-30 DIAGNOSIS — M3214 Glomerular disease in systemic lupus erythematosus: Secondary | ICD-10-CM | POA: Diagnosis not present

## 2022-10-30 DIAGNOSIS — N186 End stage renal disease: Secondary | ICD-10-CM | POA: Diagnosis not present

## 2022-10-30 DIAGNOSIS — N2581 Secondary hyperparathyroidism of renal origin: Secondary | ICD-10-CM | POA: Diagnosis not present

## 2022-10-30 DIAGNOSIS — Z992 Dependence on renal dialysis: Secondary | ICD-10-CM | POA: Diagnosis not present

## 2022-10-30 DIAGNOSIS — Z7689 Persons encountering health services in other specified circumstances: Secondary | ICD-10-CM | POA: Diagnosis not present

## 2022-11-01 DIAGNOSIS — N2581 Secondary hyperparathyroidism of renal origin: Secondary | ICD-10-CM | POA: Diagnosis not present

## 2022-11-01 DIAGNOSIS — N186 End stage renal disease: Secondary | ICD-10-CM | POA: Diagnosis not present

## 2022-11-01 DIAGNOSIS — Z7689 Persons encountering health services in other specified circumstances: Secondary | ICD-10-CM | POA: Diagnosis not present

## 2022-11-01 DIAGNOSIS — Z992 Dependence on renal dialysis: Secondary | ICD-10-CM | POA: Diagnosis not present

## 2022-11-04 DIAGNOSIS — Z992 Dependence on renal dialysis: Secondary | ICD-10-CM | POA: Diagnosis not present

## 2022-11-04 DIAGNOSIS — N2581 Secondary hyperparathyroidism of renal origin: Secondary | ICD-10-CM | POA: Diagnosis not present

## 2022-11-04 DIAGNOSIS — N186 End stage renal disease: Secondary | ICD-10-CM | POA: Diagnosis not present

## 2022-11-04 DIAGNOSIS — Z7689 Persons encountering health services in other specified circumstances: Secondary | ICD-10-CM | POA: Diagnosis not present

## 2022-11-06 DIAGNOSIS — Z992 Dependence on renal dialysis: Secondary | ICD-10-CM | POA: Diagnosis not present

## 2022-11-06 DIAGNOSIS — Z7689 Persons encountering health services in other specified circumstances: Secondary | ICD-10-CM | POA: Diagnosis not present

## 2022-11-06 DIAGNOSIS — N186 End stage renal disease: Secondary | ICD-10-CM | POA: Diagnosis not present

## 2022-11-06 DIAGNOSIS — N2581 Secondary hyperparathyroidism of renal origin: Secondary | ICD-10-CM | POA: Diagnosis not present

## 2022-11-08 DIAGNOSIS — N2581 Secondary hyperparathyroidism of renal origin: Secondary | ICD-10-CM | POA: Diagnosis not present

## 2022-11-08 DIAGNOSIS — N186 End stage renal disease: Secondary | ICD-10-CM | POA: Diagnosis not present

## 2022-11-08 DIAGNOSIS — Z992 Dependence on renal dialysis: Secondary | ICD-10-CM | POA: Diagnosis not present

## 2022-11-08 DIAGNOSIS — Z7689 Persons encountering health services in other specified circumstances: Secondary | ICD-10-CM | POA: Diagnosis not present

## 2022-11-11 DIAGNOSIS — Z7689 Persons encountering health services in other specified circumstances: Secondary | ICD-10-CM | POA: Diagnosis not present

## 2022-11-11 DIAGNOSIS — N186 End stage renal disease: Secondary | ICD-10-CM | POA: Diagnosis not present

## 2022-11-11 DIAGNOSIS — Z992 Dependence on renal dialysis: Secondary | ICD-10-CM | POA: Diagnosis not present

## 2022-11-11 DIAGNOSIS — N2581 Secondary hyperparathyroidism of renal origin: Secondary | ICD-10-CM | POA: Diagnosis not present

## 2022-11-13 DIAGNOSIS — N186 End stage renal disease: Secondary | ICD-10-CM | POA: Diagnosis not present

## 2022-11-13 DIAGNOSIS — Z992 Dependence on renal dialysis: Secondary | ICD-10-CM | POA: Diagnosis not present

## 2022-11-13 DIAGNOSIS — Z7689 Persons encountering health services in other specified circumstances: Secondary | ICD-10-CM | POA: Diagnosis not present

## 2022-11-13 DIAGNOSIS — N2581 Secondary hyperparathyroidism of renal origin: Secondary | ICD-10-CM | POA: Diagnosis not present

## 2022-11-15 DIAGNOSIS — Z992 Dependence on renal dialysis: Secondary | ICD-10-CM | POA: Diagnosis not present

## 2022-11-15 DIAGNOSIS — N2581 Secondary hyperparathyroidism of renal origin: Secondary | ICD-10-CM | POA: Diagnosis not present

## 2022-11-15 DIAGNOSIS — Z7689 Persons encountering health services in other specified circumstances: Secondary | ICD-10-CM | POA: Diagnosis not present

## 2022-11-15 DIAGNOSIS — N186 End stage renal disease: Secondary | ICD-10-CM | POA: Diagnosis not present

## 2022-11-18 DIAGNOSIS — Z7689 Persons encountering health services in other specified circumstances: Secondary | ICD-10-CM | POA: Diagnosis not present

## 2022-11-18 DIAGNOSIS — N2581 Secondary hyperparathyroidism of renal origin: Secondary | ICD-10-CM | POA: Diagnosis not present

## 2022-11-18 DIAGNOSIS — N186 End stage renal disease: Secondary | ICD-10-CM | POA: Diagnosis not present

## 2022-11-18 DIAGNOSIS — Z992 Dependence on renal dialysis: Secondary | ICD-10-CM | POA: Diagnosis not present

## 2022-11-20 DIAGNOSIS — N186 End stage renal disease: Secondary | ICD-10-CM | POA: Diagnosis not present

## 2022-11-20 DIAGNOSIS — Z7689 Persons encountering health services in other specified circumstances: Secondary | ICD-10-CM | POA: Diagnosis not present

## 2022-11-20 DIAGNOSIS — Z992 Dependence on renal dialysis: Secondary | ICD-10-CM | POA: Diagnosis not present

## 2022-11-20 DIAGNOSIS — N2581 Secondary hyperparathyroidism of renal origin: Secondary | ICD-10-CM | POA: Diagnosis not present

## 2022-11-22 DIAGNOSIS — Z7689 Persons encountering health services in other specified circumstances: Secondary | ICD-10-CM | POA: Diagnosis not present

## 2022-11-22 DIAGNOSIS — Z992 Dependence on renal dialysis: Secondary | ICD-10-CM | POA: Diagnosis not present

## 2022-11-22 DIAGNOSIS — N2581 Secondary hyperparathyroidism of renal origin: Secondary | ICD-10-CM | POA: Diagnosis not present

## 2022-11-22 DIAGNOSIS — N186 End stage renal disease: Secondary | ICD-10-CM | POA: Diagnosis not present

## 2022-11-25 DIAGNOSIS — Z7689 Persons encountering health services in other specified circumstances: Secondary | ICD-10-CM | POA: Diagnosis not present

## 2022-11-25 DIAGNOSIS — N2581 Secondary hyperparathyroidism of renal origin: Secondary | ICD-10-CM | POA: Diagnosis not present

## 2022-11-25 DIAGNOSIS — Z992 Dependence on renal dialysis: Secondary | ICD-10-CM | POA: Diagnosis not present

## 2022-11-25 DIAGNOSIS — N186 End stage renal disease: Secondary | ICD-10-CM | POA: Diagnosis not present

## 2022-11-29 DIAGNOSIS — Z992 Dependence on renal dialysis: Secondary | ICD-10-CM | POA: Diagnosis not present

## 2022-11-29 DIAGNOSIS — Z7689 Persons encountering health services in other specified circumstances: Secondary | ICD-10-CM | POA: Diagnosis not present

## 2022-11-29 DIAGNOSIS — N2581 Secondary hyperparathyroidism of renal origin: Secondary | ICD-10-CM | POA: Diagnosis not present

## 2022-11-29 DIAGNOSIS — N186 End stage renal disease: Secondary | ICD-10-CM | POA: Diagnosis not present

## 2022-11-30 DIAGNOSIS — Z992 Dependence on renal dialysis: Secondary | ICD-10-CM | POA: Diagnosis not present

## 2022-11-30 DIAGNOSIS — N186 End stage renal disease: Secondary | ICD-10-CM | POA: Diagnosis not present

## 2022-11-30 DIAGNOSIS — M3214 Glomerular disease in systemic lupus erythematosus: Secondary | ICD-10-CM | POA: Diagnosis not present

## 2022-12-02 DIAGNOSIS — N186 End stage renal disease: Secondary | ICD-10-CM | POA: Diagnosis not present

## 2022-12-02 DIAGNOSIS — Z7689 Persons encountering health services in other specified circumstances: Secondary | ICD-10-CM | POA: Diagnosis not present

## 2022-12-04 DIAGNOSIS — Z7689 Persons encountering health services in other specified circumstances: Secondary | ICD-10-CM | POA: Diagnosis not present

## 2022-12-06 DIAGNOSIS — Z7689 Persons encountering health services in other specified circumstances: Secondary | ICD-10-CM | POA: Diagnosis not present

## 2022-12-09 DIAGNOSIS — Z7689 Persons encountering health services in other specified circumstances: Secondary | ICD-10-CM | POA: Diagnosis not present

## 2022-12-11 DIAGNOSIS — Z7689 Persons encountering health services in other specified circumstances: Secondary | ICD-10-CM | POA: Diagnosis not present

## 2022-12-16 DIAGNOSIS — Z7689 Persons encountering health services in other specified circumstances: Secondary | ICD-10-CM | POA: Diagnosis not present

## 2022-12-20 DIAGNOSIS — Z7689 Persons encountering health services in other specified circumstances: Secondary | ICD-10-CM | POA: Diagnosis not present

## 2022-12-23 DIAGNOSIS — Z7689 Persons encountering health services in other specified circumstances: Secondary | ICD-10-CM | POA: Diagnosis not present

## 2022-12-25 DIAGNOSIS — Z7689 Persons encountering health services in other specified circumstances: Secondary | ICD-10-CM | POA: Diagnosis not present

## 2022-12-27 DIAGNOSIS — Z7689 Persons encountering health services in other specified circumstances: Secondary | ICD-10-CM | POA: Diagnosis not present

## 2022-12-30 DIAGNOSIS — Z7689 Persons encountering health services in other specified circumstances: Secondary | ICD-10-CM | POA: Diagnosis not present

## 2022-12-31 DIAGNOSIS — M3214 Glomerular disease in systemic lupus erythematosus: Secondary | ICD-10-CM | POA: Diagnosis not present

## 2022-12-31 DIAGNOSIS — Z992 Dependence on renal dialysis: Secondary | ICD-10-CM | POA: Diagnosis not present

## 2022-12-31 DIAGNOSIS — N186 End stage renal disease: Secondary | ICD-10-CM | POA: Diagnosis not present

## 2023-01-03 DIAGNOSIS — Z7689 Persons encountering health services in other specified circumstances: Secondary | ICD-10-CM | POA: Diagnosis not present

## 2023-01-03 DIAGNOSIS — N2581 Secondary hyperparathyroidism of renal origin: Secondary | ICD-10-CM | POA: Diagnosis not present

## 2023-01-03 DIAGNOSIS — Z992 Dependence on renal dialysis: Secondary | ICD-10-CM | POA: Diagnosis not present

## 2023-01-03 DIAGNOSIS — N186 End stage renal disease: Secondary | ICD-10-CM | POA: Diagnosis not present

## 2023-01-06 DIAGNOSIS — Z7689 Persons encountering health services in other specified circumstances: Secondary | ICD-10-CM | POA: Diagnosis not present

## 2023-01-06 DIAGNOSIS — N186 End stage renal disease: Secondary | ICD-10-CM | POA: Diagnosis not present

## 2023-01-06 DIAGNOSIS — Z992 Dependence on renal dialysis: Secondary | ICD-10-CM | POA: Diagnosis not present

## 2023-01-06 DIAGNOSIS — N2581 Secondary hyperparathyroidism of renal origin: Secondary | ICD-10-CM | POA: Diagnosis not present

## 2023-01-08 DIAGNOSIS — Z992 Dependence on renal dialysis: Secondary | ICD-10-CM | POA: Diagnosis not present

## 2023-01-08 DIAGNOSIS — N2581 Secondary hyperparathyroidism of renal origin: Secondary | ICD-10-CM | POA: Diagnosis not present

## 2023-01-08 DIAGNOSIS — Z7689 Persons encountering health services in other specified circumstances: Secondary | ICD-10-CM | POA: Diagnosis not present

## 2023-01-08 DIAGNOSIS — N186 End stage renal disease: Secondary | ICD-10-CM | POA: Diagnosis not present

## 2023-01-13 DIAGNOSIS — Z992 Dependence on renal dialysis: Secondary | ICD-10-CM | POA: Diagnosis not present

## 2023-01-13 DIAGNOSIS — N186 End stage renal disease: Secondary | ICD-10-CM | POA: Diagnosis not present

## 2023-01-13 DIAGNOSIS — Z7689 Persons encountering health services in other specified circumstances: Secondary | ICD-10-CM | POA: Diagnosis not present

## 2023-01-13 DIAGNOSIS — N2581 Secondary hyperparathyroidism of renal origin: Secondary | ICD-10-CM | POA: Diagnosis not present

## 2023-01-15 DIAGNOSIS — Z7689 Persons encountering health services in other specified circumstances: Secondary | ICD-10-CM | POA: Diagnosis not present

## 2023-01-15 DIAGNOSIS — N2581 Secondary hyperparathyroidism of renal origin: Secondary | ICD-10-CM | POA: Diagnosis not present

## 2023-01-15 DIAGNOSIS — Z992 Dependence on renal dialysis: Secondary | ICD-10-CM | POA: Diagnosis not present

## 2023-01-15 DIAGNOSIS — N186 End stage renal disease: Secondary | ICD-10-CM | POA: Diagnosis not present

## 2023-01-17 DIAGNOSIS — N186 End stage renal disease: Secondary | ICD-10-CM | POA: Diagnosis not present

## 2023-01-17 DIAGNOSIS — Z7689 Persons encountering health services in other specified circumstances: Secondary | ICD-10-CM | POA: Diagnosis not present

## 2023-01-17 DIAGNOSIS — N2581 Secondary hyperparathyroidism of renal origin: Secondary | ICD-10-CM | POA: Diagnosis not present

## 2023-01-17 DIAGNOSIS — Z992 Dependence on renal dialysis: Secondary | ICD-10-CM | POA: Diagnosis not present

## 2023-01-22 DIAGNOSIS — N186 End stage renal disease: Secondary | ICD-10-CM | POA: Diagnosis not present

## 2023-01-22 DIAGNOSIS — N2581 Secondary hyperparathyroidism of renal origin: Secondary | ICD-10-CM | POA: Diagnosis not present

## 2023-01-22 DIAGNOSIS — Z7689 Persons encountering health services in other specified circumstances: Secondary | ICD-10-CM | POA: Diagnosis not present

## 2023-01-22 DIAGNOSIS — Z992 Dependence on renal dialysis: Secondary | ICD-10-CM | POA: Diagnosis not present

## 2023-01-24 DIAGNOSIS — N186 End stage renal disease: Secondary | ICD-10-CM | POA: Diagnosis not present

## 2023-01-24 DIAGNOSIS — Z992 Dependence on renal dialysis: Secondary | ICD-10-CM | POA: Diagnosis not present

## 2023-01-24 DIAGNOSIS — Z7689 Persons encountering health services in other specified circumstances: Secondary | ICD-10-CM | POA: Diagnosis not present

## 2023-01-24 DIAGNOSIS — N2581 Secondary hyperparathyroidism of renal origin: Secondary | ICD-10-CM | POA: Diagnosis not present

## 2023-01-27 DIAGNOSIS — Z7689 Persons encountering health services in other specified circumstances: Secondary | ICD-10-CM | POA: Diagnosis not present

## 2023-01-27 DIAGNOSIS — N2581 Secondary hyperparathyroidism of renal origin: Secondary | ICD-10-CM | POA: Diagnosis not present

## 2023-01-27 DIAGNOSIS — N186 End stage renal disease: Secondary | ICD-10-CM | POA: Diagnosis not present

## 2023-01-27 DIAGNOSIS — Z992 Dependence on renal dialysis: Secondary | ICD-10-CM | POA: Diagnosis not present

## 2023-01-29 DIAGNOSIS — M3214 Glomerular disease in systemic lupus erythematosus: Secondary | ICD-10-CM | POA: Diagnosis not present

## 2023-01-29 DIAGNOSIS — Z992 Dependence on renal dialysis: Secondary | ICD-10-CM | POA: Diagnosis not present

## 2023-01-29 DIAGNOSIS — N186 End stage renal disease: Secondary | ICD-10-CM | POA: Diagnosis not present

## 2023-01-29 DIAGNOSIS — N2581 Secondary hyperparathyroidism of renal origin: Secondary | ICD-10-CM | POA: Diagnosis not present

## 2023-01-29 DIAGNOSIS — Z7689 Persons encountering health services in other specified circumstances: Secondary | ICD-10-CM | POA: Diagnosis not present

## 2023-02-03 DIAGNOSIS — Z992 Dependence on renal dialysis: Secondary | ICD-10-CM | POA: Diagnosis not present

## 2023-02-03 DIAGNOSIS — N186 End stage renal disease: Secondary | ICD-10-CM | POA: Diagnosis not present

## 2023-02-03 DIAGNOSIS — Z7689 Persons encountering health services in other specified circumstances: Secondary | ICD-10-CM | POA: Diagnosis not present

## 2023-02-03 DIAGNOSIS — N2581 Secondary hyperparathyroidism of renal origin: Secondary | ICD-10-CM | POA: Diagnosis not present

## 2023-02-05 DIAGNOSIS — Z7689 Persons encountering health services in other specified circumstances: Secondary | ICD-10-CM | POA: Diagnosis not present

## 2023-02-05 DIAGNOSIS — Z992 Dependence on renal dialysis: Secondary | ICD-10-CM | POA: Diagnosis not present

## 2023-02-05 DIAGNOSIS — N2581 Secondary hyperparathyroidism of renal origin: Secondary | ICD-10-CM | POA: Diagnosis not present

## 2023-02-05 DIAGNOSIS — N186 End stage renal disease: Secondary | ICD-10-CM | POA: Diagnosis not present

## 2023-02-09 ENCOUNTER — Telehealth: Payer: Self-pay

## 2023-02-09 DIAGNOSIS — N186 End stage renal disease: Secondary | ICD-10-CM

## 2023-02-09 NOTE — Telephone Encounter (Signed)
Penny @ [employer] called requesting to schedule an appt for f/u on AVF so it can be accessed.  Reviewed pt's chart, returned call for clarification, two identifiers used. Pt had either canceled or no showed all her post-op visits since her surgery in 05/2022. She stated that she was going to have a firm discussion with her, along with the doctor so she understands how important the f/u appt is. Appts for [place] and Dr. Donnetta Hutching scheduled, giving pt plenty of time to get transportation. Confirmed understanding.

## 2023-02-10 DIAGNOSIS — N186 End stage renal disease: Secondary | ICD-10-CM | POA: Diagnosis not present

## 2023-02-10 DIAGNOSIS — Z992 Dependence on renal dialysis: Secondary | ICD-10-CM | POA: Diagnosis not present

## 2023-02-10 DIAGNOSIS — N2581 Secondary hyperparathyroidism of renal origin: Secondary | ICD-10-CM | POA: Diagnosis not present

## 2023-02-10 DIAGNOSIS — Z7689 Persons encountering health services in other specified circumstances: Secondary | ICD-10-CM | POA: Diagnosis not present

## 2023-02-12 DIAGNOSIS — N2581 Secondary hyperparathyroidism of renal origin: Secondary | ICD-10-CM | POA: Diagnosis not present

## 2023-02-12 DIAGNOSIS — N186 End stage renal disease: Secondary | ICD-10-CM | POA: Diagnosis not present

## 2023-02-12 DIAGNOSIS — Z992 Dependence on renal dialysis: Secondary | ICD-10-CM | POA: Diagnosis not present

## 2023-02-12 DIAGNOSIS — Z7689 Persons encountering health services in other specified circumstances: Secondary | ICD-10-CM | POA: Diagnosis not present

## 2023-02-14 DIAGNOSIS — Z992 Dependence on renal dialysis: Secondary | ICD-10-CM | POA: Diagnosis not present

## 2023-02-14 DIAGNOSIS — Z7689 Persons encountering health services in other specified circumstances: Secondary | ICD-10-CM | POA: Diagnosis not present

## 2023-02-14 DIAGNOSIS — N186 End stage renal disease: Secondary | ICD-10-CM | POA: Diagnosis not present

## 2023-02-14 DIAGNOSIS — N2581 Secondary hyperparathyroidism of renal origin: Secondary | ICD-10-CM | POA: Diagnosis not present

## 2023-02-17 DIAGNOSIS — Z992 Dependence on renal dialysis: Secondary | ICD-10-CM | POA: Diagnosis not present

## 2023-02-17 DIAGNOSIS — N186 End stage renal disease: Secondary | ICD-10-CM | POA: Diagnosis not present

## 2023-02-17 DIAGNOSIS — N2581 Secondary hyperparathyroidism of renal origin: Secondary | ICD-10-CM | POA: Diagnosis not present

## 2023-02-17 DIAGNOSIS — Z7689 Persons encountering health services in other specified circumstances: Secondary | ICD-10-CM | POA: Diagnosis not present

## 2023-02-19 DIAGNOSIS — Z992 Dependence on renal dialysis: Secondary | ICD-10-CM | POA: Diagnosis not present

## 2023-02-19 DIAGNOSIS — Z7689 Persons encountering health services in other specified circumstances: Secondary | ICD-10-CM | POA: Diagnosis not present

## 2023-02-19 DIAGNOSIS — N186 End stage renal disease: Secondary | ICD-10-CM | POA: Diagnosis not present

## 2023-02-19 DIAGNOSIS — N2581 Secondary hyperparathyroidism of renal origin: Secondary | ICD-10-CM | POA: Diagnosis not present

## 2023-02-21 DIAGNOSIS — N2581 Secondary hyperparathyroidism of renal origin: Secondary | ICD-10-CM | POA: Diagnosis not present

## 2023-02-21 DIAGNOSIS — Z7689 Persons encountering health services in other specified circumstances: Secondary | ICD-10-CM | POA: Diagnosis not present

## 2023-02-21 DIAGNOSIS — Z992 Dependence on renal dialysis: Secondary | ICD-10-CM | POA: Diagnosis not present

## 2023-02-21 DIAGNOSIS — N186 End stage renal disease: Secondary | ICD-10-CM | POA: Diagnosis not present

## 2023-02-24 DIAGNOSIS — Z7689 Persons encountering health services in other specified circumstances: Secondary | ICD-10-CM | POA: Diagnosis not present

## 2023-02-24 DIAGNOSIS — N186 End stage renal disease: Secondary | ICD-10-CM | POA: Diagnosis not present

## 2023-02-24 DIAGNOSIS — N2581 Secondary hyperparathyroidism of renal origin: Secondary | ICD-10-CM | POA: Diagnosis not present

## 2023-02-24 DIAGNOSIS — Z992 Dependence on renal dialysis: Secondary | ICD-10-CM | POA: Diagnosis not present

## 2023-02-28 DIAGNOSIS — Z992 Dependence on renal dialysis: Secondary | ICD-10-CM | POA: Diagnosis not present

## 2023-02-28 DIAGNOSIS — Z7689 Persons encountering health services in other specified circumstances: Secondary | ICD-10-CM | POA: Diagnosis not present

## 2023-02-28 DIAGNOSIS — N186 End stage renal disease: Secondary | ICD-10-CM | POA: Diagnosis not present

## 2023-02-28 DIAGNOSIS — N2581 Secondary hyperparathyroidism of renal origin: Secondary | ICD-10-CM | POA: Diagnosis not present

## 2023-03-01 DIAGNOSIS — M3214 Glomerular disease in systemic lupus erythematosus: Secondary | ICD-10-CM | POA: Diagnosis not present

## 2023-03-01 DIAGNOSIS — Z992 Dependence on renal dialysis: Secondary | ICD-10-CM | POA: Diagnosis not present

## 2023-03-01 DIAGNOSIS — N186 End stage renal disease: Secondary | ICD-10-CM | POA: Diagnosis not present

## 2023-03-04 ENCOUNTER — Ambulatory Visit (INDEPENDENT_AMBULATORY_CARE_PROVIDER_SITE_OTHER): Payer: Medicare HMO | Admitting: Vascular Surgery

## 2023-03-04 ENCOUNTER — Ambulatory Visit (INDEPENDENT_AMBULATORY_CARE_PROVIDER_SITE_OTHER): Payer: Medicare HMO

## 2023-03-04 ENCOUNTER — Encounter: Payer: Self-pay | Admitting: Vascular Surgery

## 2023-03-04 VITALS — BP 153/90 | HR 52 | Temp 97.7°F | Ht 67.0 in | Wt 140.8 lb

## 2023-03-04 DIAGNOSIS — Z992 Dependence on renal dialysis: Secondary | ICD-10-CM

## 2023-03-04 DIAGNOSIS — N186 End stage renal disease: Secondary | ICD-10-CM

## 2023-03-04 DIAGNOSIS — Z7689 Persons encountering health services in other specified circumstances: Secondary | ICD-10-CM | POA: Diagnosis not present

## 2023-03-04 NOTE — Progress Notes (Signed)
[facility] [place]  Patient name: Cathy [name] MRN: [id number] DOB: 04/26/1981 Sex: female  REASON FOR VISIT: Follow-up left arm for stage basilic vein transposition fistula  HPI: Cathy [name] is a 42 y.o. female here today for follow-up.  She underwent for stage AV fistula creation by myself on 06/10/2022 at [facility].  She has failed to see [place] for follow-up on multiple occasions.  She presents today for further follow-up.  She is having hemodialysis via right IJ tunnel catheter and reports no difficulty with this.  He has dialysis at [facility]  Past Medical History:  Diagnosis Date   Anasarca associated with disorder of kidney 06/08/2021   Anxiety    Asthma    Bipolar depression    Depression    Dyspnea    ESRD (end stage renal disease) on dialysis 10/2020   GERD (gastroesophageal reflux disease)    Gonorrhea    Lupus    Pelvic inflammatory disease (PID)    Pleural effusion 06/08/2021   Pulmonary embolism 01/31/2022   Renal hypertension    Trichomonas infection     Family History  Problem Relation Age of Onset   Diabetes Maternal Grandmother    Hypertension Maternal Grandmother    Diabetes Maternal Grandfather    Hypertension Maternal Grandfather    Diabetes Paternal Grandmother    Hypertension Paternal Grandmother    Diabetes Paternal Grandfather    Hypertension Paternal Grandfather     SOCIAL HISTORY: Social History   Tobacco Use   Smoking status: Some Days    Types: Cigarettes   Smokeless tobacco: Never   Tobacco comments:    1-2 cigarettes daily   Substance Use Topics   Alcohol use: Not Currently    Allergies  Allergen Reactions   Oxycontin [Oxycodone] Itching   Dialyvite 800 [Nephro-Vite] Nausea And Vomiting    Current Outpatient Medications  Medication Sig Dispense Refill   acetaminophen (TYLENOL) 500 MG tablet Take 1,000 mg by mouth every 6  (six) hours as needed for moderate pain.     amLODipine (NORVASC) 10 MG tablet Take 1 tablet (10 mg total) by mouth in the morning. 30 tablet 2   Blood Pressure Monitoring (OMRON 3 SERIES BP MONITOR) DEVI Use as directed 2 times a day 1 each 0   carvedilol (COREG) 25 MG tablet Take 1 tablet (25 mg total) by mouth 2 (two) times daily with a meal. 60 tablet 1   cloNIDine (CATAPRES) 0.3 MG tablet Take 1 tablet (0.3 mg total) by mouth 3 (three) times daily. 90 tablet 2   guaiFENesin (MUCINEX PO) Take 2 tablets by mouth 2 (two) times daily as needed (congestion).     hydrALAZINE (APRESOLINE) 100 MG tablet Take 1 tablet (100 mg total) by mouth 3 (three) times daily. 90 tablet 1   losartan (COZAAR) 50 MG tablet Take 50 mg by mouth at bedtime.     sevelamer carbonate (RENVELA) 800 MG tablet Take 800 mg by mouth 3 (three) times daily with meals.     ipratropium (ATROVENT HFA) 17 MCG/ACT inhaler Inhale 2 puffs into the lungs every 6 (six) hours as needed for wheezing. 1 each 12   pantoprazole (PROTONIX) 40 MG tablet Take 1 tablet (40 mg total) by mouth daily before breakfast. 30 tablet 1   No current facility-administered medications for this visit.    REVIEW OF SYSTEMS:  [X]  denotes positive finding, [ ]  denotes negative finding Cardiac  Comments:  Chest pain or chest pressure:    Shortness of breath upon exertion:    Short of breath when lying flat:    Irregular heart rhythm:        Vascular    Pain in calf, thigh, or hip brought on by ambulation:    Pain in feet at night that wakes you up from your sleep:     Blood clot in your veins:    Leg swelling:           PHYSICAL EXAM: Vitals:   03/04/23 1436  BP: (!) 153/90  Pulse: (!) 52  Temp: 97.7 F (36.5 C)  SpO2: 100%  Weight: 140 lb 12.8 oz (63.9 kg)  Height: 5\' 7"  (1.702 m)    GENERAL: The patient is a well-nourished female, in no acute distress. The vital signs are documented above. CARDIOVASCULAR: As an excellent thrill in her  left arm basilic vein fistula.  She has a good size maturation by physical exam. PULMONARY: There is good air exchange  MUSCULOSKELETAL: There are no major deformities or cyanosis. NEUROLOGIC: No focal weakness or paresthesias are detected. SKIN: There are no ulcers or rashes noted. PSYCHIATRIC: The patient has a normal affect.  DATA:  Duplex of her fistula shows good size maturation from the antecubital space proximally to her axilla.  She does have high flow velocities throughout  MEDICAL ISSUES: Discussed status again with the procedure with the patient.  Explained the need for translocation of her basilic vein.  Explained this to be done as an outpatient at [facility] and that she would require 1 additional month of healing prior to use of her fistula.  We have scheduled this for Tuesday April 9.  She does have hemodialysis typically on Tuesday, Thursday and Saturday.  We will coordinate moving her from the ninth for surgery    [name], MD FACS [facility] [phone]  Note: Portions of this report may have been transcribed using voice recognition software.  Every effort has been made to ensure accuracy; however, inadvertent computerized transcription errors may still be present.

## 2023-03-04 NOTE — H&P (View-Only) (Signed)
  [facility] [place]  Patient name: Cathy [name] MRN: [id number] DOB: 07/30/1981 Sex: female  REASON FOR VISIT: Follow-up left arm for stage basilic vein transposition fistula  HPI: Cathy [name] is a 41 y.o. female here today for follow-up.  She underwent for stage AV fistula creation by myself on 06/10/2022 at [facility].  She has failed to see us for follow-up on multiple occasions.  She presents today for further follow-up.  She is having hemodialysis via right IJ tunnel catheter and reports no difficulty with this.  He has dialysis at [facility] [place]  Past Medical History:  Diagnosis Date   Anasarca associated with disorder of kidney 06/08/2021   Anxiety    Asthma    Bipolar depression    Depression    Dyspnea    ESRD (end stage renal disease) on dialysis 10/2020   GERD (gastroesophageal reflux disease)    Gonorrhea    Lupus    Pelvic inflammatory disease (PID)    Pleural effusion 06/08/2021   Pulmonary embolism 01/31/2022   Renal hypertension    Trichomonas infection     Family History  Problem Relation Age of Onset   Diabetes Maternal Grandmother    Hypertension Maternal Grandmother    Diabetes Maternal Grandfather    Hypertension Maternal Grandfather    Diabetes Paternal Grandmother    Hypertension Paternal Grandmother    Diabetes Paternal Grandfather    Hypertension Paternal Grandfather     SOCIAL HISTORY: Social History   Tobacco Use   Smoking status: Some Days    Types: Cigarettes   Smokeless tobacco: Never   Tobacco comments:    1-2 cigarettes daily   Substance Use Topics   Alcohol use: Not Currently    Allergies  Allergen Reactions   Oxycontin [Oxycodone] Itching   Dialyvite 800 [Nephro-Vite] Nausea And Vomiting    Current Outpatient Medications  Medication Sig Dispense Refill   acetaminophen (TYLENOL) 500 MG tablet Take 1,000 mg by mouth every 6  (six) hours as needed for moderate pain.     amLODipine (NORVASC) 10 MG tablet Take 1 tablet (10 mg total) by mouth in the morning. 30 tablet 2   Blood Pressure Monitoring (OMRON 3 SERIES BP MONITOR) DEVI Use as directed 2 times a day 1 each 0   carvedilol (COREG) 25 MG tablet Take 1 tablet (25 mg total) by mouth 2 (two) times daily with a meal. 60 tablet 1   cloNIDine (CATAPRES) 0.3 MG tablet Take 1 tablet (0.3 mg total) by mouth 3 (three) times daily. 90 tablet 2   guaiFENesin (MUCINEX PO) Take 2 tablets by mouth 2 (two) times daily as needed (congestion).     hydrALAZINE (APRESOLINE) 100 MG tablet Take 1 tablet (100 mg total) by mouth 3 (three) times daily. 90 tablet 1   losartan (COZAAR) 50 MG tablet Take 50 mg by mouth at bedtime.     sevelamer carbonate (RENVELA) 800 MG tablet Take 800 mg by mouth 3 (three) times daily with meals.     ipratropium (ATROVENT HFA) 17 MCG/ACT inhaler Inhale 2 puffs into the lungs every 6 (six) hours as needed for wheezing. 1 each 12   pantoprazole (PROTONIX) 40 MG tablet Take 1 tablet (40 mg total) by mouth daily before breakfast. 30 tablet 1   No current facility-administered medications for this visit.    REVIEW OF SYSTEMS:  [X] denotes positive finding, [ ] denotes negative finding Cardiac  Comments:    Chest pain or chest pressure:    Shortness of breath upon exertion:    Short of breath when lying flat:    Irregular heart rhythm:        Vascular    Pain in calf, thigh, or hip brought on by ambulation:    Pain in feet at night that wakes you up from your sleep:     Blood clot in your veins:    Leg swelling:           PHYSICAL EXAM: Vitals:   03/04/23 1436  BP: (!) 153/90  Pulse: (!) 52  Temp: 97.7 F (36.5 C)  SpO2: 100%  Weight: 140 lb 12.8 oz (63.9 kg)  Height: 5' 7" (1.702 m)    GENERAL: The patient is a well-nourished female, in no acute distress. The vital signs are documented above. CARDIOVASCULAR: As an excellent thrill in her  left arm basilic vein fistula.  She has a good size maturation by physical exam. PULMONARY: There is good air exchange  MUSCULOSKELETAL: There are no major deformities or cyanosis. NEUROLOGIC: No focal weakness or paresthesias are detected. SKIN: There are no ulcers or rashes noted. PSYCHIATRIC: The patient has a normal affect.  DATA:  Duplex of her fistula shows good size maturation from the antecubital space proximally to her axilla.  She does have high flow velocities throughout  MEDICAL ISSUES: Discussed status again with the procedure with the patient.  Explained the need for translocation of her basilic vein.  Explained this to be done as an outpatient at [facility] and that she would require 1 additional month of healing prior to use of her fistula.  We have scheduled this for Tuesday April 9.  She does have hemodialysis typically on Tuesday, Thursday and Saturday.  We will coordinate moving her from the ninth for surgery    Gaelyn Tukes F. Zyeir Dymek, MD FACS [facility] [place] Office Tel [phone]  Note: Portions of this report may have been transcribed using voice recognition software.  Every effort has been made to ensure accuracy; however, inadvertent computerized transcription errors may still be present. 

## 2023-03-05 ENCOUNTER — Telehealth: Payer: Self-pay

## 2023-03-05 ENCOUNTER — Other Ambulatory Visit: Payer: Self-pay

## 2023-03-05 DIAGNOSIS — Z992 Dependence on renal dialysis: Secondary | ICD-10-CM | POA: Diagnosis not present

## 2023-03-05 DIAGNOSIS — N186 End stage renal disease: Secondary | ICD-10-CM | POA: Diagnosis not present

## 2023-03-05 DIAGNOSIS — Z7689 Persons encountering health services in other specified circumstances: Secondary | ICD-10-CM | POA: Diagnosis not present

## 2023-03-05 DIAGNOSIS — N2581 Secondary hyperparathyroidism of renal origin: Secondary | ICD-10-CM | POA: Diagnosis not present

## 2023-03-05 NOTE — Telephone Encounter (Signed)
Attempted to reach patient to schedule surgery, but no answer. Left vm message for patient to return call.   Spoke with Kieth Brightly at [employer] [employer]. Agreed to schedule patient for surgery on 4/9 as indicated by Dr. Donnetta Hutching and will provide information to patient while there today during treatment. Instructions provided and will also fax to [phone]. Voiced understanding.

## 2023-03-05 NOTE — Telephone Encounter (Signed)
Received a call from [place] at dialysis center. States patient request to move surgery date because lack of transportation. Surgery rescheduled for 4/23 and instructions reviewed. She voiced understanding.

## 2023-03-07 DIAGNOSIS — Z7689 Persons encountering health services in other specified circumstances: Secondary | ICD-10-CM | POA: Diagnosis not present

## 2023-03-07 DIAGNOSIS — N2581 Secondary hyperparathyroidism of renal origin: Secondary | ICD-10-CM | POA: Diagnosis not present

## 2023-03-07 DIAGNOSIS — Z992 Dependence on renal dialysis: Secondary | ICD-10-CM | POA: Diagnosis not present

## 2023-03-07 DIAGNOSIS — N186 End stage renal disease: Secondary | ICD-10-CM | POA: Diagnosis not present

## 2023-03-09 ENCOUNTER — Other Ambulatory Visit (HOSPITAL_COMMUNITY): Payer: Medicare HMO

## 2023-03-12 DIAGNOSIS — Z992 Dependence on renal dialysis: Secondary | ICD-10-CM | POA: Diagnosis not present

## 2023-03-12 DIAGNOSIS — N186 End stage renal disease: Secondary | ICD-10-CM | POA: Diagnosis not present

## 2023-03-12 DIAGNOSIS — N2581 Secondary hyperparathyroidism of renal origin: Secondary | ICD-10-CM | POA: Diagnosis not present

## 2023-03-12 DIAGNOSIS — Z7689 Persons encountering health services in other specified circumstances: Secondary | ICD-10-CM | POA: Diagnosis not present

## 2023-03-14 DIAGNOSIS — Z7689 Persons encountering health services in other specified circumstances: Secondary | ICD-10-CM | POA: Diagnosis not present

## 2023-03-14 DIAGNOSIS — Z992 Dependence on renal dialysis: Secondary | ICD-10-CM | POA: Diagnosis not present

## 2023-03-14 DIAGNOSIS — N2581 Secondary hyperparathyroidism of renal origin: Secondary | ICD-10-CM | POA: Diagnosis not present

## 2023-03-14 DIAGNOSIS — N186 End stage renal disease: Secondary | ICD-10-CM | POA: Diagnosis not present

## 2023-03-19 DIAGNOSIS — Z992 Dependence on renal dialysis: Secondary | ICD-10-CM | POA: Diagnosis not present

## 2023-03-19 DIAGNOSIS — Z7689 Persons encountering health services in other specified circumstances: Secondary | ICD-10-CM | POA: Diagnosis not present

## 2023-03-19 DIAGNOSIS — N186 End stage renal disease: Secondary | ICD-10-CM | POA: Diagnosis not present

## 2023-03-19 DIAGNOSIS — N2581 Secondary hyperparathyroidism of renal origin: Secondary | ICD-10-CM | POA: Diagnosis not present

## 2023-03-20 ENCOUNTER — Encounter (HOSPITAL_COMMUNITY): Payer: Self-pay

## 2023-03-20 ENCOUNTER — Other Ambulatory Visit: Payer: Self-pay

## 2023-03-20 ENCOUNTER — Encounter (HOSPITAL_COMMUNITY)
Admission: RE | Admit: 2023-03-20 | Discharge: 2023-03-20 | Disposition: A | Discharge status: Home / Self Care | Payer: Medicare HMO | Source: Ambulatory Visit | Attending: Vascular Surgery | Admitting: Vascular Surgery

## 2023-03-20 DIAGNOSIS — D631 Anemia in chronic kidney disease: Secondary | ICD-10-CM

## 2023-03-24 ENCOUNTER — Ambulatory Visit (HOSPITAL_COMMUNITY): Payer: Medicare HMO | Admitting: Anesthesiology

## 2023-03-24 ENCOUNTER — Ambulatory Visit (HOSPITAL_COMMUNITY)
Admission: RE | Admit: 2023-03-24 | Discharge: 2023-03-24 | Disposition: A | Discharge status: Home / Self Care | Payer: Medicare HMO | Attending: Vascular Surgery | Admitting: Vascular Surgery

## 2023-03-24 ENCOUNTER — Ambulatory Visit (HOSPITAL_BASED_OUTPATIENT_CLINIC_OR_DEPARTMENT_OTHER): Payer: Medicare HMO | Admitting: Anesthesiology

## 2023-03-24 ENCOUNTER — Encounter (HOSPITAL_COMMUNITY)
Admission: RE | Disposition: A | Discharge status: Home / Self Care | Payer: Self-pay | Source: Home / Self Care | Attending: Vascular Surgery

## 2023-03-24 ENCOUNTER — Other Ambulatory Visit: Payer: Self-pay

## 2023-03-24 ENCOUNTER — Encounter (HOSPITAL_COMMUNITY): Payer: Self-pay | Admitting: Vascular Surgery

## 2023-03-24 DIAGNOSIS — Z992 Dependence on renal dialysis: Secondary | ICD-10-CM | POA: Insufficient documentation

## 2023-03-24 DIAGNOSIS — N185 Chronic kidney disease, stage 5: Secondary | ICD-10-CM | POA: Diagnosis not present

## 2023-03-24 DIAGNOSIS — K7460 Unspecified cirrhosis of liver: Secondary | ICD-10-CM | POA: Diagnosis not present

## 2023-03-24 DIAGNOSIS — J45909 Unspecified asthma, uncomplicated: Secondary | ICD-10-CM | POA: Diagnosis not present

## 2023-03-24 DIAGNOSIS — I132 Hypertensive heart and chronic kidney disease with heart failure and with stage 5 chronic kidney disease, or end stage renal disease: Secondary | ICD-10-CM

## 2023-03-24 DIAGNOSIS — J189 Pneumonia, unspecified organism: Secondary | ICD-10-CM

## 2023-03-24 DIAGNOSIS — Z86711 Personal history of pulmonary embolism: Secondary | ICD-10-CM | POA: Insufficient documentation

## 2023-03-24 DIAGNOSIS — N186 End stage renal disease: Secondary | ICD-10-CM | POA: Diagnosis not present

## 2023-03-24 DIAGNOSIS — F319 Bipolar disorder, unspecified: Secondary | ICD-10-CM | POA: Diagnosis not present

## 2023-03-24 DIAGNOSIS — F17210 Nicotine dependence, cigarettes, uncomplicated: Secondary | ICD-10-CM | POA: Diagnosis not present

## 2023-03-24 DIAGNOSIS — Z87891 Personal history of nicotine dependence: Secondary | ICD-10-CM | POA: Diagnosis not present

## 2023-03-24 DIAGNOSIS — I509 Heart failure, unspecified: Secondary | ICD-10-CM | POA: Insufficient documentation

## 2023-03-24 DIAGNOSIS — D631 Anemia in chronic kidney disease: Secondary | ICD-10-CM

## 2023-03-24 DIAGNOSIS — K219 Gastro-esophageal reflux disease without esophagitis: Secondary | ICD-10-CM | POA: Insufficient documentation

## 2023-03-24 HISTORY — PX: BASCILIC VEIN TRANSPOSITION: SHX5742

## 2023-03-24 LAB — CBC
HCT: 28.3 % — ABNORMAL LOW (ref 36.0–46.0)
Hemoglobin: 9.1 g/dL — ABNORMAL LOW (ref 12.0–15.0)
MCH: 29.4 pg (ref 26.0–34.0)
MCHC: 32.2 g/dL (ref 30.0–36.0)
MCV: 91.3 fL (ref 80.0–100.0)
Platelets: 145 10*3/uL — ABNORMAL LOW (ref 150–400)
RBC: 3.1 MIL/uL — ABNORMAL LOW (ref 3.87–5.11)
RDW: 19 % — ABNORMAL HIGH (ref 11.5–15.5)
WBC: 4.4 10*3/uL (ref 4.0–10.5)
nRBC: 0 % (ref 0.0–0.2)

## 2023-03-24 LAB — BASIC METABOLIC PANEL
Anion gap: 15 (ref 5–15)
BUN: 75 mg/dL — ABNORMAL HIGH (ref 6–20)
CO2: 22 mmol/L (ref 22–32)
Calcium: 7.7 mg/dL — ABNORMAL LOW (ref 8.9–10.3)
Chloride: 99 mmol/L (ref 98–111)
Creatinine, Ser: 16.49 mg/dL — ABNORMAL HIGH (ref 0.44–1.00)
GFR, Estimated: 3 mL/min — ABNORMAL LOW (ref >60)
Glucose, Bld: 90 mg/dL (ref 70–99)
Potassium: 5.5 mmol/L — ABNORMAL HIGH (ref 3.5–5.1)
Sodium: 136 mmol/L (ref 135–145)

## 2023-03-24 LAB — HCG, QUANTITATIVE, PREGNANCY: hCG, Beta Chain, Quant, S: 2 m[IU]/mL (ref <5)

## 2023-03-24 SURGERY — TRANSPOSITION, VEIN, BASILIC
Anesthesia: General | Site: Arm Upper | Laterality: Left

## 2023-03-24 MED ORDER — SODIUM CHLORIDE 0.9 % IV SOLN: INTRAVENOUS | Status: DC

## 2023-03-24 MED ORDER — CHLORHEXIDINE GLUCONATE 4 % EX LIQD
60.0000 mL | Freq: Once | CUTANEOUS | Status: DC
  Filled 2023-03-24: qty 60

## 2023-03-24 MED ORDER — METOCLOPRAMIDE HCL 5 MG/ML IJ SOLN
10.0000 mg | Freq: Once | INTRAMUSCULAR | Status: AC
  Administered 2023-03-24: 10 mg via INTRAVENOUS

## 2023-03-24 MED ORDER — ROCURONIUM BROMIDE 10 MG/ML (PF) SYRINGE
PREFILLED_SYRINGE | INTRAVENOUS | Status: AC
  Filled 2023-03-24: qty 10

## 2023-03-24 MED ORDER — ONDANSETRON HCL 4 MG/2ML IJ SOLN
4.0000 mg | Freq: Once | INTRAMUSCULAR | Status: AC | PRN
  Administered 2023-03-24: 4 mg via INTRAVENOUS
  Filled 2023-03-24: qty 2

## 2023-03-24 MED ORDER — PHENYLEPHRINE HCL-NACL 20-0.9 MG/250ML-% IV SOLN
INTRAVENOUS | Status: AC
  Filled 2023-03-24: qty 250

## 2023-03-24 MED ORDER — LIDOCAINE HCL (PF) 2 % IJ SOLN
INTRAMUSCULAR | Status: AC
  Filled 2023-03-24: qty 5

## 2023-03-24 MED ORDER — ROCURONIUM BROMIDE 100 MG/10ML IV SOLN
INTRAVENOUS | Status: DC | PRN
  Administered 2023-03-24: 50 mg via INTRAVENOUS
  Administered 2023-03-24: 20 mg via INTRAVENOUS

## 2023-03-24 MED ORDER — SUGAMMADEX SODIUM 500 MG/5ML IV SOLN
INTRAVENOUS | Status: DC | PRN
  Administered 2023-03-24: 200 mg via INTRAVENOUS

## 2023-03-24 MED ORDER — HYDROMORPHONE HCL 1 MG/ML IJ SOLN
0.2500 mg | INTRAMUSCULAR | Status: DC | PRN
  Administered 2023-03-24 (×2): 0.5 mg via INTRAVENOUS
  Filled 2023-03-24 (×2): qty 0.5

## 2023-03-24 MED ORDER — CEFAZOLIN SODIUM-DEXTROSE 2-4 GM/100ML-% IV SOLN
INTRAVENOUS | Status: AC
  Filled 2023-03-24: qty 100

## 2023-03-24 MED ORDER — HEPARIN SODIUM (PORCINE) 1000 UNIT/ML IJ SOLN
INTRAMUSCULAR | Status: AC
  Filled 2023-03-24: qty 6

## 2023-03-24 MED ORDER — ONDANSETRON HCL 4 MG/2ML IJ SOLN
INTRAMUSCULAR | Status: DC | PRN
  Administered 2023-03-24: 4 mg via INTRAVENOUS

## 2023-03-24 MED ORDER — HEPARIN 6000 UNIT IRRIGATION SOLUTION
Status: DC | PRN
  Administered 2023-03-24: 1

## 2023-03-24 MED ORDER — FENTANYL CITRATE (PF) 100 MCG/2ML IJ SOLN
INTRAMUSCULAR | Status: DC | PRN
  Administered 2023-03-24 (×2): 25 ug via INTRAVENOUS

## 2023-03-24 MED ORDER — PROPOFOL 10 MG/ML IV BOLUS
INTRAVENOUS | Status: AC
  Filled 2023-03-24: qty 20

## 2023-03-24 MED ORDER — PROPOFOL 10 MG/ML IV BOLUS
INTRAVENOUS | Status: DC | PRN
  Administered 2023-03-24: 20 mg via INTRAVENOUS
  Administered 2023-03-24: 150 mg via INTRAVENOUS

## 2023-03-24 MED ORDER — LIDOCAINE-EPINEPHRINE 0.5 %-1:200000 IJ SOLN
INTRAMUSCULAR | Status: AC
  Filled 2023-03-24: qty 50

## 2023-03-24 MED ORDER — FENTANYL CITRATE (PF) 100 MCG/2ML IJ SOLN
INTRAMUSCULAR | Status: AC
  Filled 2023-03-24: qty 2

## 2023-03-24 MED ORDER — CHLORHEXIDINE GLUCONATE 0.12 % MT SOLN
15.0000 mL | Freq: Once | OROMUCOSAL | Status: AC
  Administered 2023-03-24: 15 mL via OROMUCOSAL

## 2023-03-24 MED ORDER — METOCLOPRAMIDE HCL 5 MG/ML IJ SOLN
INTRAMUSCULAR | Status: AC
  Filled 2023-03-24: qty 2

## 2023-03-24 MED ORDER — ORAL CARE MOUTH RINSE: 15.0000 mL | Freq: Once | OROMUCOSAL | Status: AC

## 2023-03-24 MED ORDER — DEXAMETHASONE SODIUM PHOSPHATE 10 MG/ML IJ SOLN
INTRAMUSCULAR | Status: AC
  Filled 2023-03-24: qty 1

## 2023-03-24 MED ORDER — ONDANSETRON HCL 4 MG PO TABS: 4.0000 mg | ORAL_TABLET | Freq: Three times a day (TID) | ORAL | 0 refills | Status: DC | PRN

## 2023-03-24 MED ORDER — 0.9 % SODIUM CHLORIDE (POUR BTL) OPTIME
TOPICAL | Status: DC | PRN
  Administered 2023-03-24: 1000 mL

## 2023-03-24 MED ORDER — CEFAZOLIN SODIUM-DEXTROSE 2-4 GM/100ML-% IV SOLN
2.0000 g | INTRAVENOUS | Status: AC
  Administered 2023-03-24: 2 g via INTRAVENOUS

## 2023-03-24 MED ORDER — LIDOCAINE-EPINEPHRINE 0.5 %-1:200000 IJ SOLN
INTRAMUSCULAR | Status: DC | PRN
  Administered 2023-03-24: 31 mL

## 2023-03-24 MED ORDER — TRAMADOL HCL 50 MG PO TABS: 50.0000 mg | ORAL_TABLET | Freq: Four times a day (QID) | ORAL | 0 refills | Status: DC | PRN

## 2023-03-24 MED ORDER — LIDOCAINE HCL (CARDIAC) PF 100 MG/5ML IV SOSY
PREFILLED_SYRINGE | INTRAVENOUS | Status: DC | PRN
  Administered 2023-03-24: 60 mg via INTRAVENOUS

## 2023-03-24 MED ORDER — DEXAMETHASONE SODIUM PHOSPHATE 10 MG/ML IJ SOLN
INTRAMUSCULAR | Status: DC | PRN
  Administered 2023-03-24: 5 mg via INTRAVENOUS

## 2023-03-24 MED ORDER — ONDANSETRON HCL 4 MG/2ML IJ SOLN
INTRAMUSCULAR | Status: AC
  Filled 2023-03-24: qty 2

## 2023-03-24 SURGICAL SUPPLY — 43 items
ADH SKN CLS APL DERMABOND .7 (GAUZE/BANDAGES/DRESSINGS) ×1
ARMBAND PINK RESTRICT EXTREMIT (MISCELLANEOUS) ×2 IMPLANT
BAG HAMPER (MISCELLANEOUS) ×2 IMPLANT
CANNULA VESSEL 3MM 2 BLNT TIP (CANNULA) ×2 IMPLANT
CLIP LIGATING EXTRA MED SLVR (CLIP) ×2 IMPLANT
CLIP LIGATING EXTRA SM BLUE (MISCELLANEOUS) ×2 IMPLANT
COVER LIGHT HANDLE STERIS (MISCELLANEOUS) ×2 IMPLANT
COVER MAYO STAND XLG (MISCELLANEOUS) ×2 IMPLANT
COVER PROBE U/S 5X48 (MISCELLANEOUS) IMPLANT
DERMABOND ADVANCED .7 DNX12 (GAUZE/BANDAGES/DRESSINGS) ×2 IMPLANT
ELECT REM PT RETURN 9FT ADLT (ELECTROSURGICAL) ×1
ELECTRODE REM PT RTRN 9FT ADLT (ELECTROSURGICAL) ×2 IMPLANT
GAUZE SPONGE 4X4 12PLY STRL (GAUZE/BANDAGES/DRESSINGS) ×2 IMPLANT
GLOVE BIOGEL PI IND STRL 7.0 (GLOVE) ×4 IMPLANT
GLOVE SURG MICRO LTX SZ7.5 (GLOVE) ×2 IMPLANT
GOWN STRL REUS W/TWL LRG LVL3 (GOWN DISPOSABLE) ×6 IMPLANT
IV NS 500ML (IV SOLUTION) ×1
IV NS 500ML BAXH (IV SOLUTION) ×2 IMPLANT
KIT BLADEGUARD II DBL (SET/KITS/TRAYS/PACK) ×2 IMPLANT
KIT TURNOVER KIT A (KITS) ×2 IMPLANT
MANIFOLD NEPTUNE II (INSTRUMENTS) ×2 IMPLANT
MARKER SKIN DUAL TIP RULER LAB (MISCELLANEOUS) ×4 IMPLANT
NDL HYPO 18GX1.5 BLUNT FILL (NEEDLE) ×4 IMPLANT
NEEDLE HYPO 18GX1.5 BLUNT FILL (NEEDLE) ×2 IMPLANT
NS IRRIG 1000ML POUR BTL (IV SOLUTION) ×2 IMPLANT
PACK CV ACCESS (CUSTOM PROCEDURE TRAY) ×2 IMPLANT
PAD ARMBOARD 7.5X6 YLW CONV (MISCELLANEOUS) ×2 IMPLANT
POSITIONER HEAD 8X9X4 ADT (SOFTGOODS) ×2 IMPLANT
SET BASIN LINEN APH (SET/KITS/TRAYS/PACK) ×2 IMPLANT
SOL PREP POV-IOD 4OZ 10% (MISCELLANEOUS) ×2 IMPLANT
SOL PREP PROV IODINE SCRUB 4OZ (MISCELLANEOUS) ×2 IMPLANT
SPONGE T-LAP 18X18 ~~LOC~~+RFID (SPONGE) ×2 IMPLANT
STOCKINETTE 4X48 STRL (DRAPES) ×2 IMPLANT
SUT PROLENE 6 0 CC (SUTURE) ×2 IMPLANT
SUT SILK 2 0 SH (SUTURE) ×2 IMPLANT
SUT SILK 3 0 (SUTURE) ×1
SUT SILK 3-0 18XBRD TIE 12 (SUTURE) IMPLANT
SUT VIC AB 3-0 SH 27 (SUTURE) ×2
SUT VIC AB 3-0 SH 27X BRD (SUTURE) ×4 IMPLANT
SYR 10ML LL (SYRINGE) ×2 IMPLANT
SYR BULB IRRIG 60ML STRL (SYRINGE) ×2 IMPLANT
UNDERPAD 30X36 HEAVY ABSORB (UNDERPADS AND DIAPERS) ×2 IMPLANT
WATER STERILE IRR 1000ML POUR (IV SOLUTION) ×2 IMPLANT

## 2023-03-24 NOTE — Progress Notes (Signed)
Labs drawn with IV start.  Urine pregnancy was needed but patient was unable to void.  Serum quantitative drawn and sent to lab.  Asked lab about drawing a serum qualitative and they state they are not drawing them at this time, only using serum quantitative's.

## 2023-03-24 NOTE — Op Note (Signed)
    OPERATIVE REPORT  DATE OF SURGERY: 03/24/2023  PATIENT: Cathy [name], 42 y.o. female MRN: [id number]  DOB: [birth year]  PRE-OPERATIVE DIAGNOSIS: End-stage renal disease  POST-OPERATIVE DIAGNOSIS:  Same  PROCEDURE: Left second stage basilic vein transposition  SURGEON:  Gretta Began, M.D.  PHYSICIAN ASSISTANT: Rebeca Alert, RNFA  The assistant was needed for exposure and to expedite the case  ANESTHESIA: General  EBL: per anesthesia record  Total I/O In: 500 [I.V.:400; IV Piggyback:100] Out: 10 [Blood:10]  BLOOD ADMINISTERED: none  DRAINS: none  SPECIMEN: none  COUNTS CORRECT:  YES  PATIENT DISPOSITION:  PACU - hemodynamically stable  PROCEDURE DETAILS: The patient was taken everyplace Uman[place]or tributary of the left arm and left axilla were prepped and draped in usual sterile fashion.  SonoSite ultrasound was used to mark the level of the basilic vein throughout its course.  The patient had excellent size of her basilic vein.  Incision was made over the prior brachiobasilic anastomosis and the anastomosis was exposed.  3 separate incisions were made in the upper arm over the basilic vein.  The vein was mobilized circumferentially.  The patient had multiple large vein branches and these were ligated with 2-0 and 3-0 silk ties and divided.  The vein was occluded near the prior brachial artery anastomosis.  The vein was transected.  The vein was brought out through the native tunnel up to the axilla.  The vein was marked to reduce risk of twisting.  A new subcutaneous tunnel was created lateral to the native position of her basilic vein.  The vein was then brought back through this tunnel.  The vein ends were spatulated and the vein was sewn into into itself with a running 6-0 Prolene suture.  Clamps were removed and excellent thrill was noted.  The wounds were irrigated with saline.  Hemostasis was obtained with electrocautery.  The wound was closed with 3-0 Vicryl in the  subcutaneous and subcuticular tissue.  Dermabond was applied and the patient was transferred to the recovery room with 2+ radial pulse.   Larina Earthly, M.D., [facility] 03/24/2023 2:19 PM  Note: Portions of this report may have been transcribed using voice recognition software.  Every effort has been made to ensure accuracy; however, inadvertent computerized transcription errors may still be present.

## 2023-03-24 NOTE — Anesthesia Preprocedure Evaluation (Addendum)
Anesthesia Evaluation  Patient identified by MRN, date of birth, ID band Patient awake    Reviewed: Allergy & Precautions, H&P , NPO status , Patient's Chart, lab work & pertinent test results  Airway Mallampati: II  TM Distance: >3 FB Neck ROM: Full    Dental no notable dental hx. (+) Dental Advisory Given, Teeth Intact   Pulmonary shortness of breath and with exertion, asthma , pneumonia, Current Smoker and Patient abstained from smoking., PE   Pulmonary exam normal breath sounds clear to auscultation       Cardiovascular hypertension, Pt. on medications +CHF   Rhythm:Regular Rate:Normal + Systolic murmurs 1. Left ventricular ejection fraction, by estimation, is 35 to 40%. The  left ventricle has moderately decreased function. The left ventricle  demonstrates global hypokinesis.   2. Right ventricular systolic function is normal. The right ventricular  size is normal.   3. No left atrial/left atrial appendage thrombus was detected. The LAA  emptying velocity was 70 cm/s.   4. Catheter tip seen in the right atrium, no evidence of vegetation.   5. The mitral valve is normal in structure. Mild mitral valve  regurgitation. No evidence of mitral stenosis.   6. Tricuspid valve regurgitation is moderate.   7. The aortic valve is normal in structure. Aortic valve regurgitation is  mild. No aortic stenosis is present.   8. The inferior vena cava is normal in size with greater than 50%  respiratory variability, suggesting right atrial pressure of 3 mmHg.     Neuro/Psych  PSYCHIATRIC DISORDERS Anxiety Depression Bipolar Disorder   negative neurological ROS     GI/Hepatic ,GERD  Poorly Controlled and Medicated,,(+) Cirrhosis   ascites      Endo/Other  negative endocrine ROS    Renal/GU Renal disease  negative genitourinary   Musculoskeletal negative musculoskeletal ROS (+)    Abdominal   Peds negative pediatric ROS (+)   Hematology  (+) Blood dyscrasia, anemia   Anesthesia Other Findings Lupus   Reproductive/Obstetrics negative OB ROS                             Anesthesia Physical Anesthesia Plan  ASA: 4  Anesthesia Plan: General   Post-op Pain Management: Dilaudid IV   Induction: Intravenous  PONV Risk Score and Plan: 3 and Ondansetron, Dexamethasone and Metaclopromide  Airway Management Planned: Oral ETT and LMA  Additional Equipment:   Intra-op Plan:   Post-operative Plan: Extubation in OR  Informed Consent: I have reviewed the patients History and Physical, chart, labs and discussed the procedure including the risks, benefits and alternatives for the proposed anesthesia with the patient or authorized representative who has indicated his/her understanding and acceptance.     Dental advisory given  Plan Discussed with: CRNA and Surgeon  Anesthesia Plan Comments: (Patient refused regional anesthesia for the procedure.)        Anesthesia Quick Evaluation

## 2023-03-24 NOTE — Transfer of Care (Signed)
Immediate Anesthesia Transfer of Care Note  Patient: Cathy [name]  Procedure(s) Performed: LEFT ARM SECOND STAGE BASILIC VEIN TRANSPOSITION (Left: Arm Upper)  Patient Location: PACU  Anesthesia Type:General  Level of Consciousness: drowsy and patient cooperative  Airway & Oxygen Therapy: Patient Spontanous Breathing  Post-op Assessment: Report given to RN and Post -op Vital signs reviewed and stable  Post vital signs: Reviewed and stable  Last Vitals:  Vitals Value Taken Time  BP 165/94 03/24/23  1352  Temp 98.4 03/24/23   1352  Pulse 51 03/24/23   1352  Resp 16 03/2323  1352   SpO2 100 03/24/23  1352    Last Pain:  Vitals:   03/24/23 0849  PainSc: 0-No pain         Complications: No notable events documented.

## 2023-03-24 NOTE — Discharge Instructions (Signed)
[facility]  Discharge Instructions  AV Fistula or Graft Surgery for Dialysis Access  Please refer to the following instructions for your post-procedure care. Your surgeon or physician assistant will discuss any changes with you.  Activity  You may drive the day following your surgery, if you are comfortable and no longer taking prescription pain medication. Resume full activity as the soreness in your incision resolves.  Bathing/Showering  You may shower after you go home. Keep your incision dry for 48 hours. Do not soak in a bathtub, hot tub, or swim until the incision heals completely. You may not shower if you have a hemodialysis catheter.  Incision Care  Clean your incision with mild soap and water after 48 hours. Pat the area dry with a clean towel. You do not need a bandage unless otherwise instructed. Do not apply any ointments or creams to your incision. You may have skin glue on your incision. Do not peel it off. It will come off on its own in about one week. Your arm may swell a bit after surgery. To reduce swelling use pillows to elevate your arm so it is above your heart. Your doctor will tell you if you need to lightly wrap your arm with an ACE bandage.  Diet  Resume your normal diet. There are not special food restrictions following this procedure. In order to heal from your surgery, it is CRITICAL to get adequate nutrition. Your body requires vitamins, minerals, and protein. Vegetables are the best source of vitamins and minerals. Vegetables also provide the perfect balance of protein. Processed food has little nutritional value, so try to avoid this.  Medications  Resume taking all of your medications. If your incision is causing pain, you may take over-the counter pain relievers such as acetaminophen (Tylenol). If you were prescribed a stronger pain medication, please be aware these medications can cause nausea and constipation. Prevent  nausea by taking the medication with a snack or meal. Avoid constipation by drinking plenty of fluids and eating foods with high amount of fiber, such as fruits, vegetables, and grains.  Do not take Tylenol if you are taking prescription pain medications.  Follow up Your surgeon may want to see you in the office following your access surgery. If so, this will be arranged at the time of your surgery.  Please call us immediately for any of the following conditions:  Increased pain, redness, drainage (pus) from your incision site Fever of 101 degrees or higher Severe or worsening pain at your incision site Hand pain or numbness.  Reduce your risk of vascular disease:  Stop smoking. If you would like help, call [facility] at 1-800-QUIT-NOW (872-408-0828) or [place] at 4306107040  Manage your cholesterol Maintain a desired weight Control your diabetes Keep your blood pressure down  Dialysis  It will take several weeks to several months for your new dialysis access to be ready for use. Your surgeon will determine when it is okay to use it. Your nephrologist will continue to direct your dialysis. You can continue to use your Permcath until your new access is ready for use.   03/24/2023 Cathy [name] [id number] 20-Sep-1981  Surgeon(s): Kynnedy Carreno, Kristen Loader, MD  Procedure(s): LEFT ARM SECOND STAGE BASILIC VEIN TRANSPOSITION   May stick graft immediately   May stick graft on designated area only:    Do not stick fistula for 4 weeks    If you have any questions, please call the office at  [phone].  

## 2023-03-24 NOTE — Interval H&P Note (Signed)
History and Physical Interval Note:  03/24/2023 10:49 AM  Cathy [name]  has presented today for surgery, with the diagnosis of ESRD.  The various methods of treatment have been discussed with the patient and family. After consideration of risks, benefits and other options for treatment, the patient has consented to  Procedure(s): LEFT ARM SECOND STAGE BASILIC VEIN TRANSPOSITION (Left) as a surgical intervention.  The patient's history has been reviewed, patient examined, no change in status, stable for surgery.  I have reviewed the patient's chart and labs.  Questions were answered to the patient's satisfaction.     Gretta Began

## 2023-03-24 NOTE — Anesthesia Procedure Notes (Signed)
Procedure Name: Intubation Date/Time: 03/24/2023 11:24 AM  Performed by: Franco Nones, CRNAPre-anesthesia Checklist: Patient identified, Patient being monitored, Timeout performed, Emergency Drugs available and Suction available Patient Re-evaluated:Patient Re-evaluated prior to induction Oxygen Delivery Method: Circle system utilized Preoxygenation: Pre-oxygenation with 100% oxygen Induction Type: IV induction Ventilation: Mask ventilation without difficulty Laryngoscope Size: Mac and 3 Grade View: Grade I Tube type: Oral Tube size: 7.0 mm Number of attempts: 1 Airway Equipment and Method: Stylet Placement Confirmation: ETT inserted through vocal cords under direct vision, positive ETCO2 and breath sounds checked- equal and bilateral Secured at: 22 cm Tube secured with: Tape Dental Injury: Teeth and Oropharynx as per pre-operative assessment

## 2023-03-24 NOTE — Anesthesia Postprocedure Evaluation (Signed)
Anesthesia Post Note  Patient: Cathy [name]  Procedure(s) Performed: LEFT ARM SECOND STAGE BASILIC VEIN TRANSPOSITION (Left: Arm Upper)  Patient location during evaluation: PACU Anesthesia Type: General Level of consciousness: awake and alert and oriented Pain management: pain level controlled Vital Signs Assessment: post-procedure vital signs reviewed and stable Respiratory status: spontaneous breathing, nonlabored ventilation and respiratory function stable Cardiovascular status: blood pressure returned to baseline and stable Postop Assessment: no apparent nausea or vomiting Anesthetic complications: no  No notable events documented.   Last Vitals:  Vitals:   03/24/23 1424 03/24/23 1430  BP:  (!) 169/98  Pulse: (!) 50 (!) 49  Resp: 14 14  Temp:    SpO2: 100% 100%    Last Pain:  Vitals:   03/24/23 1424  PainSc: (P) 7                  Kristina Bertone C [place]

## 2023-03-26 ENCOUNTER — Telehealth: Payer: Self-pay | Admitting: Vascular Surgery

## 2023-03-26 DIAGNOSIS — N2581 Secondary hyperparathyroidism of renal origin: Secondary | ICD-10-CM | POA: Diagnosis not present

## 2023-03-26 DIAGNOSIS — Z7689 Persons encountering health services in other specified circumstances: Secondary | ICD-10-CM | POA: Diagnosis not present

## 2023-03-26 DIAGNOSIS — Z992 Dependence on renal dialysis: Secondary | ICD-10-CM | POA: Diagnosis not present

## 2023-03-26 DIAGNOSIS — N186 End stage renal disease: Secondary | ICD-10-CM | POA: Diagnosis not present

## 2023-03-26 NOTE — Telephone Encounter (Signed)
-----   Message from Larina Earthly, MD sent at 03/24/2023  2:21 PM EDT -----  Left second stage basilic vein transposition fistula.  Midwife.  I need to see her in the office in 4 to 6 weeks.  She does not need duplex

## 2023-03-27 ENCOUNTER — Encounter (HOSPITAL_COMMUNITY): Payer: Self-pay | Admitting: Vascular Surgery

## 2023-03-31 DIAGNOSIS — M3214 Glomerular disease in systemic lupus erythematosus: Secondary | ICD-10-CM | POA: Diagnosis not present

## 2023-03-31 DIAGNOSIS — N2581 Secondary hyperparathyroidism of renal origin: Secondary | ICD-10-CM | POA: Diagnosis not present

## 2023-03-31 DIAGNOSIS — Z7689 Persons encountering health services in other specified circumstances: Secondary | ICD-10-CM | POA: Diagnosis not present

## 2023-03-31 DIAGNOSIS — N186 End stage renal disease: Secondary | ICD-10-CM | POA: Diagnosis not present

## 2023-03-31 DIAGNOSIS — Z992 Dependence on renal dialysis: Secondary | ICD-10-CM | POA: Diagnosis not present

## 2023-04-02 DIAGNOSIS — N186 End stage renal disease: Secondary | ICD-10-CM | POA: Diagnosis not present

## 2023-04-02 DIAGNOSIS — Z7689 Persons encountering health services in other specified circumstances: Secondary | ICD-10-CM | POA: Diagnosis not present

## 2023-04-02 DIAGNOSIS — N2581 Secondary hyperparathyroidism of renal origin: Secondary | ICD-10-CM | POA: Diagnosis not present

## 2023-04-02 DIAGNOSIS — Z992 Dependence on renal dialysis: Secondary | ICD-10-CM | POA: Diagnosis not present

## 2023-04-04 DIAGNOSIS — N2581 Secondary hyperparathyroidism of renal origin: Secondary | ICD-10-CM | POA: Diagnosis not present

## 2023-04-04 DIAGNOSIS — Z7689 Persons encountering health services in other specified circumstances: Secondary | ICD-10-CM | POA: Diagnosis not present

## 2023-04-04 DIAGNOSIS — Z992 Dependence on renal dialysis: Secondary | ICD-10-CM | POA: Diagnosis not present

## 2023-04-04 DIAGNOSIS — N186 End stage renal disease: Secondary | ICD-10-CM | POA: Diagnosis not present

## 2023-04-07 DIAGNOSIS — Z992 Dependence on renal dialysis: Secondary | ICD-10-CM | POA: Diagnosis not present

## 2023-04-07 DIAGNOSIS — N2581 Secondary hyperparathyroidism of renal origin: Secondary | ICD-10-CM | POA: Diagnosis not present

## 2023-04-07 DIAGNOSIS — N186 End stage renal disease: Secondary | ICD-10-CM | POA: Diagnosis not present

## 2023-04-07 DIAGNOSIS — Z7689 Persons encountering health services in other specified circumstances: Secondary | ICD-10-CM | POA: Diagnosis not present

## 2023-04-08 NOTE — Telephone Encounter (Signed)
Appt has been scheduled.

## 2023-04-09 ENCOUNTER — Other Ambulatory Visit: Payer: Self-pay

## 2023-04-09 DIAGNOSIS — N2581 Secondary hyperparathyroidism of renal origin: Secondary | ICD-10-CM | POA: Diagnosis not present

## 2023-04-09 DIAGNOSIS — Z7689 Persons encountering health services in other specified circumstances: Secondary | ICD-10-CM | POA: Diagnosis not present

## 2023-04-09 DIAGNOSIS — Z992 Dependence on renal dialysis: Secondary | ICD-10-CM | POA: Diagnosis not present

## 2023-04-09 DIAGNOSIS — N186 End stage renal disease: Secondary | ICD-10-CM | POA: Diagnosis not present

## 2023-04-11 DIAGNOSIS — N186 End stage renal disease: Secondary | ICD-10-CM | POA: Diagnosis not present

## 2023-04-11 DIAGNOSIS — N2581 Secondary hyperparathyroidism of renal origin: Secondary | ICD-10-CM | POA: Diagnosis not present

## 2023-04-11 DIAGNOSIS — Z7689 Persons encountering health services in other specified circumstances: Secondary | ICD-10-CM | POA: Diagnosis not present

## 2023-04-11 DIAGNOSIS — Z992 Dependence on renal dialysis: Secondary | ICD-10-CM | POA: Diagnosis not present

## 2023-04-14 DIAGNOSIS — Z7689 Persons encountering health services in other specified circumstances: Secondary | ICD-10-CM | POA: Diagnosis not present

## 2023-04-14 DIAGNOSIS — N2581 Secondary hyperparathyroidism of renal origin: Secondary | ICD-10-CM | POA: Diagnosis not present

## 2023-04-14 DIAGNOSIS — Z992 Dependence on renal dialysis: Secondary | ICD-10-CM | POA: Diagnosis not present

## 2023-04-14 DIAGNOSIS — N186 End stage renal disease: Secondary | ICD-10-CM | POA: Diagnosis not present

## 2023-04-16 DIAGNOSIS — N186 End stage renal disease: Secondary | ICD-10-CM | POA: Diagnosis not present

## 2023-04-16 DIAGNOSIS — Z7689 Persons encountering health services in other specified circumstances: Secondary | ICD-10-CM | POA: Diagnosis not present

## 2023-04-16 DIAGNOSIS — Z992 Dependence on renal dialysis: Secondary | ICD-10-CM | POA: Diagnosis not present

## 2023-04-16 DIAGNOSIS — N2581 Secondary hyperparathyroidism of renal origin: Secondary | ICD-10-CM | POA: Diagnosis not present

## 2023-04-21 DIAGNOSIS — Z7689 Persons encountering health services in other specified circumstances: Secondary | ICD-10-CM | POA: Diagnosis not present

## 2023-04-21 DIAGNOSIS — N186 End stage renal disease: Secondary | ICD-10-CM | POA: Diagnosis not present

## 2023-04-21 DIAGNOSIS — N2581 Secondary hyperparathyroidism of renal origin: Secondary | ICD-10-CM | POA: Diagnosis not present

## 2023-04-21 DIAGNOSIS — Z992 Dependence on renal dialysis: Secondary | ICD-10-CM | POA: Diagnosis not present

## 2023-04-23 DIAGNOSIS — Z992 Dependence on renal dialysis: Secondary | ICD-10-CM | POA: Diagnosis not present

## 2023-04-23 DIAGNOSIS — Z7689 Persons encountering health services in other specified circumstances: Secondary | ICD-10-CM | POA: Diagnosis not present

## 2023-04-23 DIAGNOSIS — N186 End stage renal disease: Secondary | ICD-10-CM | POA: Diagnosis not present

## 2023-04-23 DIAGNOSIS — N2581 Secondary hyperparathyroidism of renal origin: Secondary | ICD-10-CM | POA: Diagnosis not present

## 2023-04-25 DIAGNOSIS — Z7689 Persons encountering health services in other specified circumstances: Secondary | ICD-10-CM | POA: Diagnosis not present

## 2023-04-28 DIAGNOSIS — Z7689 Persons encountering health services in other specified circumstances: Secondary | ICD-10-CM | POA: Diagnosis not present

## 2023-04-28 DIAGNOSIS — N186 End stage renal disease: Secondary | ICD-10-CM | POA: Diagnosis not present

## 2023-04-28 DIAGNOSIS — N2581 Secondary hyperparathyroidism of renal origin: Secondary | ICD-10-CM | POA: Diagnosis not present

## 2023-04-28 DIAGNOSIS — Z992 Dependence on renal dialysis: Secondary | ICD-10-CM | POA: Diagnosis not present

## 2023-04-29 ENCOUNTER — Ambulatory Visit (INDEPENDENT_AMBULATORY_CARE_PROVIDER_SITE_OTHER): Payer: Medicare HMO | Admitting: Vascular Surgery

## 2023-04-29 ENCOUNTER — Encounter: Payer: Self-pay | Admitting: Vascular Surgery

## 2023-04-29 VITALS — BP 189/106 | HR 61 | Temp 98.2°F | Ht 67.0 in | Wt 129.6 lb

## 2023-04-29 DIAGNOSIS — Z7689 Persons encountering health services in other specified circumstances: Secondary | ICD-10-CM | POA: Diagnosis not present

## 2023-04-29 DIAGNOSIS — N186 End stage renal disease: Secondary | ICD-10-CM

## 2023-04-29 DIAGNOSIS — Z992 Dependence on renal dialysis: Secondary | ICD-10-CM

## 2023-04-29 NOTE — Progress Notes (Signed)
[facility]  Patient name: Cathy [name] MRN: [id number] DOB: 1981/02/06 Sex: female  REASON FOR VISIT: Follow-up left second stage basilic vein transposition  HPI: Cathy [name] is a 42 y.o. female here today for follow-up.  She underwent second stage transposition on 03/24/2023.  She has no steal symptoms.  She does have some nerve irritation which she states is on the medial aspect of her forearm down to her fifth finger.  Current Outpatient Medications  Medication Sig Dispense Refill   acetaminophen (TYLENOL) 500 MG tablet Take 1,000 mg by mouth every 6 (six) hours as needed for moderate pain.     amLODipine (NORVASC) 10 MG tablet Take 1 tablet (10 mg total) by mouth in the morning. 30 tablet 2   B Complex-C-Folic Acid (DIALYVITE 800) 0.8 MG TABS Take 1 tablet by mouth daily.     Blood Pressure Monitoring (OMRON 3 SERIES BP MONITOR) DEVI Use as directed 2 times a day 1 each 0   carvedilol (COREG) 25 MG tablet Take 1 tablet (25 mg total) by mouth 2 (two) times daily with a meal. 60 tablet 1   cloNIDine (CATAPRES) 0.3 MG tablet Take 1 tablet (0.3 mg total) by mouth 3 (three) times daily. 90 tablet 2   Doxercalciferol (HECTOROL IV) Doxercalciferol (Hectorol)     guaiFENesin (MUCINEX PO) Take 2 tablets by mouth 2 (two) times daily as needed (congestion).     hydrALAZINE (APRESOLINE) 100 MG tablet Take 1 tablet (100 mg total) by mouth 3 (three) times daily. 90 tablet 1   losartan (COZAAR) 50 MG tablet Take 50 mg by mouth at bedtime.     Methoxy PEG-Epoetin Beta (MIRCERA IJ) Mircera     ondansetron (ZOFRAN) 4 MG tablet Take 1 tablet (4 mg total) by mouth every 8 (eight) hours as needed for nausea or vomiting. 20 tablet 0   traMADol (ULTRAM) 50 MG tablet Take 1 tablet (50 mg total) by mouth every 6 (six) hours as needed. 15 tablet 0   VELPHORO 500 MG chewable tablet Chew 1,000 mg by mouth 3 (three) times daily.      ipratropium (ATROVENT HFA) 17 MCG/ACT inhaler Inhale 2 puffs into the lungs every 6 (six) hours as needed for wheezing. (Patient taking differently: Inhale 2 puffs into the lungs 2 (two) times daily as needed for wheezing (Shortness of breath).) 1 each 12   pantoprazole (PROTONIX) 40 MG tablet Take 1 tablet (40 mg total) by mouth daily before breakfast. (Patient not taking: Reported on 03/23/2023) 30 tablet 1   sevelamer carbonate (RENVELA) 800 MG tablet Take 800 mg by mouth 3 (three) times daily with meals. (Patient not taking: Reported on 03/23/2023)     No current facility-administered medications for this visit.     PHYSICAL EXAM: Vitals:   04/29/23 0938  BP: (!) 189/106  Pulse: 61  Temp: 98.2 F (36.8 C)  SpO2: 98%  Weight: 129 lb 9.6 oz (58.8 kg)  Height: 5\' 7"  (1.702 m)    GENERAL: The patient is a well-nourished female, in no acute distress. The vital signs are documented above. Excellent size maturation of her basilic vein fistula.  All surgical incisions are well-healed.  She has excellent thrill in the fistula with runs superficial and in a straight course  MEDICAL ISSUES: 5 weeks out from second stage transposition.  I would wait 1 additional week and then she is able to use her fistula.  I discussed removal of her catheter when  she has had several successful dialysis uses.  This can be done at [facility] up through the end of June.  After this catheters will need to be removed in Peter Minium, MD [facility] [place] Office Tel [phone]  Note: Portions of this report may have been transcribed using voice recognition software.  Every effort has been made to ensure accuracy; however, inadvertent computerized transcription errors may still be present.

## 2023-04-30 DIAGNOSIS — N2581 Secondary hyperparathyroidism of renal origin: Secondary | ICD-10-CM | POA: Diagnosis not present

## 2023-04-30 DIAGNOSIS — Z7689 Persons encountering health services in other specified circumstances: Secondary | ICD-10-CM | POA: Diagnosis not present

## 2023-04-30 DIAGNOSIS — N186 End stage renal disease: Secondary | ICD-10-CM | POA: Diagnosis not present

## 2023-04-30 DIAGNOSIS — Z992 Dependence on renal dialysis: Secondary | ICD-10-CM | POA: Diagnosis not present

## 2023-05-01 DIAGNOSIS — M3214 Glomerular disease in systemic lupus erythematosus: Secondary | ICD-10-CM | POA: Diagnosis not present

## 2023-05-01 DIAGNOSIS — N186 End stage renal disease: Secondary | ICD-10-CM | POA: Diagnosis not present

## 2023-05-01 DIAGNOSIS — Z992 Dependence on renal dialysis: Secondary | ICD-10-CM | POA: Diagnosis not present

## 2023-05-02 ENCOUNTER — Encounter: Payer: Self-pay | Admitting: Hematology and Oncology

## 2023-05-04 ENCOUNTER — Ambulatory Visit: Payer: Medicare HMO | Admitting: Family Medicine

## 2023-06-10 ENCOUNTER — Ambulatory Visit: Payer: Medicare HMO | Admitting: Family Medicine

## 2023-06-12 ENCOUNTER — Other Ambulatory Visit: Payer: Self-pay

## 2023-06-12 ENCOUNTER — Encounter (HOSPITAL_COMMUNITY): Payer: Self-pay

## 2023-06-12 ENCOUNTER — Emergency Department (HOSPITAL_COMMUNITY)
Admission: EM | Admit: 2023-06-12 | Discharge: 2023-06-12 | Disposition: A | Discharge status: Home / Self Care | Payer: Medicare HMO | Attending: Emergency Medicine | Admitting: Emergency Medicine

## 2023-06-12 DIAGNOSIS — I132 Hypertensive heart and chronic kidney disease with heart failure and with stage 5 chronic kidney disease, or end stage renal disease: Secondary | ICD-10-CM | POA: Insufficient documentation

## 2023-06-12 DIAGNOSIS — E875 Hyperkalemia: Secondary | ICD-10-CM | POA: Diagnosis not present

## 2023-06-12 DIAGNOSIS — Z79899 Other long term (current) drug therapy: Secondary | ICD-10-CM | POA: Insufficient documentation

## 2023-06-12 DIAGNOSIS — Z992 Dependence on renal dialysis: Secondary | ICD-10-CM | POA: Diagnosis not present

## 2023-06-12 DIAGNOSIS — N186 End stage renal disease: Secondary | ICD-10-CM | POA: Diagnosis not present

## 2023-06-12 DIAGNOSIS — I5020 Unspecified systolic (congestive) heart failure: Secondary | ICD-10-CM | POA: Insufficient documentation

## 2023-06-12 DIAGNOSIS — R0602 Shortness of breath: Secondary | ICD-10-CM | POA: Diagnosis not present

## 2023-06-12 LAB — COMPREHENSIVE METABOLIC PANEL
ALT: 25 U/L (ref 0–44)
AST: 29 U/L (ref 15–41)
Albumin: 3.4 g/dL — ABNORMAL LOW (ref 3.5–5.0)
Alkaline Phosphatase: 109 U/L (ref 38–126)
Anion gap: 14 (ref 5–15)
BUN: 76 mg/dL — ABNORMAL HIGH (ref 6–20)
CO2: 20 mmol/L — ABNORMAL LOW (ref 22–32)
Calcium: 8 mg/dL — ABNORMAL LOW (ref 8.9–10.3)
Chloride: 102 mmol/L (ref 98–111)
Creatinine, Ser: 18.61 mg/dL — ABNORMAL HIGH (ref 0.44–1.00)
GFR, Estimated: 2 mL/min — ABNORMAL LOW (ref >60)
Glucose, Bld: 95 mg/dL (ref 70–99)
Potassium: 5.3 mmol/L — ABNORMAL HIGH (ref 3.5–5.1)
Sodium: 136 mmol/L (ref 135–145)
Total Bilirubin: 0.7 mg/dL (ref 0.3–1.2)
Total Protein: 6.9 g/dL (ref 6.5–8.1)

## 2023-06-12 LAB — CBC
HCT: 32.5 % — ABNORMAL LOW (ref 36.0–46.0)
Hemoglobin: 10.4 g/dL — ABNORMAL LOW (ref 12.0–15.0)
MCH: 30.9 pg (ref 26.0–34.0)
MCHC: 32 g/dL (ref 30.0–36.0)
MCV: 96.4 fL (ref 80.0–100.0)
Platelets: 182 10*3/uL (ref 150–400)
RBC: 3.37 MIL/uL — ABNORMAL LOW (ref 3.87–5.11)
RDW: 17.9 % — ABNORMAL HIGH (ref 11.5–15.5)
WBC: 5.2 10*3/uL (ref 4.0–10.5)
nRBC: 0 % (ref 0.0–0.2)

## 2023-06-12 LAB — MAGNESIUM: Magnesium: 3 mg/dL — ABNORMAL HIGH (ref 1.7–2.4)

## 2023-06-12 LAB — HEPATITIS B SURFACE ANTIGEN: Hepatitis B Surface Ag: NONREACTIVE

## 2023-06-12 MED ORDER — ONDANSETRON HCL 4 MG/2ML IJ SOLN
4.0000 mg | Freq: Once | INTRAMUSCULAR | Status: AC
  Administered 2023-06-12: 4 mg via INTRAVENOUS
  Filled 2023-06-12: qty 2

## 2023-06-12 MED ORDER — OXYCODONE HCL 5 MG PO TABS
5.0000 mg | ORAL_TABLET | ORAL | Status: AC
  Administered 2023-06-12: 5 mg via ORAL
  Filled 2023-06-12: qty 1

## 2023-06-12 MED ORDER — ACETAMINOPHEN 500 MG PO TABS: 1000.0000 mg | ORAL_TABLET | Freq: Once | ORAL | Status: DC

## 2023-06-12 MED ORDER — FENTANYL CITRATE PF 50 MCG/ML IJ SOSY
50.0000 ug | PREFILLED_SYRINGE | Freq: Once | INTRAMUSCULAR | Status: AC
  Administered 2023-06-12: 50 ug via INTRAVENOUS
  Filled 2023-06-12: qty 1

## 2023-06-12 MED ORDER — DIPHENHYDRAMINE HCL 25 MG PO CAPS
25.0000 mg | ORAL_CAPSULE | Freq: Once | ORAL | Status: AC
  Administered 2023-06-12: 25 mg via ORAL
  Filled 2023-06-12: qty 1

## 2023-06-12 MED ORDER — PROCHLORPERAZINE MALEATE 10 MG PO TABS: 10.0000 mg | ORAL_TABLET | Freq: Two times a day (BID) | ORAL | 0 refills | Status: DC | PRN

## 2023-06-12 MED ORDER — SODIUM ZIRCONIUM CYCLOSILICATE 5 G PO PACK
10.0000 g | PACK | Freq: Once | ORAL | Status: AC
  Administered 2023-06-12: 10 g via ORAL
  Filled 2023-06-12: qty 2

## 2023-06-12 NOTE — Discharge Instructions (Signed)
You are seen in the emergency department after missing her dialysis.  We talked to the nephrologist who have set you up for an appointment tomorrow at your usual time.  Social work will be reaching out to you about arranging transportation.  Return to the emergency department if you miss your dialysis appointment tomorrow.

## 2023-06-12 NOTE — ED Provider Notes (Signed)
[place] EMERGENCY DEPARTMENT AT [facility] Provider Note   CSN: [id number] Arrival date & time: 06/12/23  0830     History  Chief Complaint  Patient presents with   needs dialysis    Cathy [name] is a 42 y.o. female.  42 year old female with a history of SLE with lupus nephritis, ESRD on IHD, NASH, systolic CHF (EF 35 to 40%), and hypertension who presents to the emergency department with requests for dialysis.  Patient typically goes to [facility] dialysis on Tuesday, Thursday, and Saturday.  Last dialysis session was on Saturday but due to insurance issues where she was having difficulty with transportation she has not been able to attend the last 2 dialysis sessions.  Says that since then she has felt generally fatigued and is having diffuse muscle aches and cramps as well as nausea and vomiting.  Also has had some mild shortness of breath.  Only makes a small amount of urine.  No significant lower extremity swelling.       Home Medications Prior to Admission medications   Medication Sig Start Date End Date Taking? Authorizing Provider  prochlorperazine (COMPAZINE) 10 MG tablet Take 1 tablet (10 mg total) by mouth 2 (two) times daily as needed for nausea or vomiting. 06/12/23  Yes Rondel Baton, MD  acetaminophen (TYLENOL) 500 MG tablet Take 1,000 mg by mouth every 6 (six) hours as needed for moderate pain.    [provider]  amLODipine (NORVASC) 10 MG tablet Take 1 tablet (10 mg total) by mouth in the morning. 10/27/22   Vassie Loll, MD  B Complex-C-Folic Acid (DIALYVITE 800) 0.8 MG TABS Take 1 tablet by mouth daily. 01/03/23   [provider]  Blood Pressure Monitoring (OMRON 3 SERIES BP MONITOR) DEVI Use as directed 2 times a day 01/09/22   Burnadette Pop, MD  carvedilol (COREG) 25 MG tablet Take 1 tablet (25 mg total) by mouth 2 (two) times daily with a meal. 03/05/22   Tat, Onalee Hua, MD  cloNIDine (CATAPRES) 0.3 MG tablet Take 1 tablet (0.3  mg total) by mouth 3 (three) times daily. 10/27/22   Vassie Loll, MD  Doxercalciferol (HECTOROL IV) Doxercalciferol (Hectorol) 03/19/23   [provider]  guaiFENesin (MUCINEX PO) Take 2 tablets by mouth 2 (two) times daily as needed (congestion).    [provider]  hydrALAZINE (APRESOLINE) 100 MG tablet Take 1 tablet (100 mg total) by mouth 3 (three) times daily. 03/05/22   Catarina Hartshorn, MD  ipratropium (ATROVENT HFA) 17 MCG/ACT inhaler Inhale 2 puffs into the lungs every 6 (six) hours as needed for wheezing. Patient taking differently: Inhale 2 puffs into the lungs 2 (two) times daily as needed for wheezing (Shortness of breath). 12/17/21 03/23/23  Rai, Delene Ruffini, MD  losartan (COZAAR) 50 MG tablet Take 50 mg by mouth at bedtime. 01/27/23   [provider]  Methoxy PEG-Epoetin Beta (MIRCERA IJ) Mircera 03/10/23 03/08/24  [provider]  ondansetron (ZOFRAN) 4 MG tablet Take 1 tablet (4 mg total) by mouth every 8 (eight) hours as needed for nausea or vomiting. 03/24/23   Early, Kristen Loader, MD  pantoprazole (PROTONIX) 40 MG tablet Take 1 tablet (40 mg total) by mouth daily before breakfast. Patient not taking: Reported on 03/23/2023 10/27/22 12/26/22  Vassie Loll, MD  sevelamer carbonate (RENVELA) 800 MG tablet Take 800 mg by mouth 3 (three) times daily with meals. Patient not taking: Reported on 03/23/2023 08/19/22   [provider]  traMADol (  ULTRAM) 50 MG tablet Take 1 tablet (50 mg total) by mouth every 6 (six) hours as needed. 03/24/23   Larina Earthly, MD  VELPHORO 500 MG chewable tablet Chew 1,000 mg by mouth 3 (three) times daily.    [provider]  fenofibrate 160 MG tablet Take 1 tablet (160 mg total) by mouth daily. 12/31/20 01/20/21  Regalado, Prentiss Bells, MD      Allergies    Oxycontin [oxycodone] and Dialyvite 800 [nephro-vite]    Review of Systems   Review of Systems  Physical Exam Updated Vital Signs BP (!) 176/98   Pulse (!) 54   Temp  98.5 F (36.9 C) (Oral)   Resp (!) 22   Ht 5\' 7"  (1.702 m)   Wt 55.8 kg   LMP  (LMP Unknown) Comment: Per pt, periods stopped in 2020.  SpO2 98%   BMI 19.26 kg/m  Physical Exam Vitals and nursing note reviewed.  Constitutional:      General: She is not in acute distress.    Appearance: She is well-developed.  HENT:     Head: Normocephalic and atraumatic.     Right Ear: External ear normal.     Left Ear: External ear normal.     Nose: Nose normal.  Eyes:     Extraocular Movements: Extraocular movements intact.     Conjunctiva/sclera: Conjunctivae normal.     Pupils: Pupils are equal, round, and reactive to light.  Cardiovascular:     Rate and Rhythm: Normal rate and regular rhythm.     Heart sounds: No murmur heard.    Comments: Fistula in left upper extremity with bruit and thrill Pulmonary:     Effort: Pulmonary effort is normal. No respiratory distress.     Breath sounds: Normal breath sounds.  Musculoskeletal:     Cervical back: Normal range of motion and neck supple.     Right lower leg: No edema.     Left lower leg: No edema.  Skin:    General: Skin is warm and dry.  Neurological:     Mental Status: She is alert and oriented to person, place, and time. Mental status is at baseline.  Psychiatric:        Mood and Affect: Mood normal.     ED Results / Procedures / Treatments   Labs (all labs ordered are listed, but only abnormal results are displayed) Labs Reviewed  COMPREHENSIVE METABOLIC PANEL - Abnormal; Notable for the following components:      Result Value   Potassium 5.3 (*)    CO2 20 (*)    BUN 76 (*)    Creatinine, Ser 18.61 (*)    Calcium 8.0 (*)    Albumin 3.4 (*)    GFR, Estimated 2 (*)    All other components within normal limits  CBC - Abnormal; Notable for the following components:   RBC 3.37 (*)    Hemoglobin 10.4 (*)    HCT 32.5 (*)    RDW 17.9 (*)    All other components within normal limits  MAGNESIUM - Abnormal; Notable for the  following components:   Magnesium 3.0 (*)    All other components within normal limits  HEPATITIS B SURFACE ANTIGEN    EKG EKG Interpretation Date/Time:  Friday June 12 2023 09:00:20 EDT Ventricular Rate:  52 PR Interval:  164 QRS Duration:  97 QT Interval:  564 QTC Calculation: 525 R Axis:   62  Text Interpretation: Sinus rhythm Left atrial enlargement  Left ventricular hypertrophy Abnormal T, consider ischemia, lateral leads Prolonged QT interval Confirmed by Vonita Moss [phone]) on 06/12/2023 9:02:49 AM  Radiology No results found.  Procedures Procedures    Medications Ordered in ED Medications  acetaminophen (TYLENOL) tablet 1,000 mg (1,000 mg Oral Not Given 06/12/23 1157)  ondansetron (ZOFRAN) injection 4 mg (4 mg Intravenous Given 06/12/23 0906)  fentaNYL (SUBLIMAZE) injection 50 mcg (50 mcg Intravenous Given 06/12/23 0906)  sodium zirconium cyclosilicate (LOKELMA) packet 10 g (10 g Oral Given 06/12/23 1124)  oxyCODONE (Oxy IR/ROXICODONE) immediate release tablet 5 mg (5 mg Oral Given 06/12/23 1158)  diphenhydrAMINE (BENADRYL) capsule 25 mg (25 mg Oral Given 06/12/23 1158)    ED Course/ Medical Decision Making/ A&P Clinical Course as of 06/12/23 1650  Fri Jun 12, 2023  1050 Dr Signe Colt from nephrology was consulted regarding the patient's symptoms and labs and will contact her dialysis center. [RP]    Clinical Course User Index [RP] Rondel Baton, MD                             Medical Decision Making Amount and/or Complexity of Data Reviewed Labs: ordered.  Risk OTC drugs. Prescription drug management.   Cathy [name] is a 42 y.o. female with comorbidities that complicate the patient evaluation including SLE with lupus nephritis, ESRD on IHD, NASH, systolic CHF (EF 35 to 40%), and hypertension who presents to the emergency department with requests for dialysis.   Initial Ddx:  Hyperkalemia, uremia, volume overload, gastroenteritis  MDM/Course:   Patient presents to the emergency department with generalized fatigue and nausea in the setting of missing dialysis.  Not currently volume overloaded.  Was given some Zofran as well as oxycodone for pain was much improved.  Does not have any significant abdominal tenderness to palpation or abdominal pain specifically that would be concerning for acute surgical pathology of her abdomen.  Upon re-evaluation patient remained stable.  Was found to have potassium of 5.3 and was given a dose of Lokelma.  Discussed her elevated BUN and creatinine with nephrology in case her nausea and vomiting fatigue or symptoms of uremia.  It was felt that this was less likely and that the patient can follow-up as an outpatient for dialysis tomorrow.  Nephrology did contact her dialysis center and patient has an appointment for tomorrow.  They did state that this patient is well-known to them and that she has had issues getting to dialysis in the past and that it may not be an insurance issue.  Social work consult was placed for the patient to assist with transportation to dialysis.  Patient was informed that if she cannot get dialysis tomorrow to come back to the emergency department.  This patient presents to the ED for concern of complaints listed in HPI, this involves an extensive number of treatment options, and is a complaint that carries with it a high risk of complications and morbidity. Disposition including potential need for admission considered.   Dispo: DC Home. Return precautions discussed including, but not limited to, those listed in the AVS. Allowed pt time to ask questions which were answered fully prior to dc.  Records reviewed Outpatient Clinic Notes The following labs were independently interpreted: Chemistry and show CKD I personally reviewed and interpreted cardiac monitoring: normal sinus rhythm  I personally reviewed and interpreted the pt's EKG: see above for interpretation  I have reviewed the  patients home medications and made adjustments  as needed Consults: Nephrology       Final Clinical Impression(s) / ED Diagnoses Final diagnoses:  ESRD (end stage renal disease) (HCC)  Hyperkalemia    Rx / DC Orders ED Discharge Orders          Ordered    prochlorperazine (COMPAZINE) 10 MG tablet  2 times daily PRN        06/12/23 1116              Rondel Baton, MD 06/12/23 1650

## 2023-06-12 NOTE — ED Triage Notes (Signed)
Pt having problem with insurance and transportation. Regualr dialysis it T,Th,Sat. And she's been "kicked off the bus" both times this week to go get dialysis" Last dialysis was Saturday 7/6, did entire session.

## 2023-06-14 ENCOUNTER — Emergency Department (HOSPITAL_COMMUNITY): Payer: Medicare HMO

## 2023-06-14 ENCOUNTER — Other Ambulatory Visit: Payer: Self-pay

## 2023-06-14 ENCOUNTER — Emergency Department (HOSPITAL_COMMUNITY)
Admission: EM | Admit: 2023-06-14 | Discharge: 2023-06-14 | Disposition: A | Discharge status: Home / Self Care | Payer: Medicare HMO | Attending: Emergency Medicine | Admitting: Emergency Medicine

## 2023-06-14 DIAGNOSIS — Z992 Dependence on renal dialysis: Secondary | ICD-10-CM | POA: Insufficient documentation

## 2023-06-14 DIAGNOSIS — R109 Unspecified abdominal pain: Secondary | ICD-10-CM | POA: Insufficient documentation

## 2023-06-14 DIAGNOSIS — N186 End stage renal disease: Secondary | ICD-10-CM | POA: Diagnosis not present

## 2023-06-14 DIAGNOSIS — R0602 Shortness of breath: Secondary | ICD-10-CM | POA: Diagnosis present

## 2023-06-14 DIAGNOSIS — E8770 Fluid overload, unspecified: Secondary | ICD-10-CM

## 2023-06-14 LAB — CBC WITH DIFFERENTIAL/PLATELET
Abs Immature Granulocytes: 0.03 10*3/uL (ref 0.00–0.07)
Basophils Absolute: 0 10*3/uL (ref 0.0–0.1)
Basophils Relative: 1 %
Eosinophils Absolute: 0.2 10*3/uL (ref 0.0–0.5)
Eosinophils Relative: 4 %
HCT: 32.7 % — ABNORMAL LOW (ref 36.0–46.0)
Hemoglobin: 10.4 g/dL — ABNORMAL LOW (ref 12.0–15.0)
Immature Granulocytes: 1 %
Lymphocytes Relative: 22 %
Lymphs Abs: 1 10*3/uL (ref 0.7–4.0)
MCH: 30.6 pg (ref 26.0–34.0)
MCHC: 31.8 g/dL (ref 30.0–36.0)
MCV: 96.2 fL (ref 80.0–100.0)
Monocytes Absolute: 0.4 10*3/uL (ref 0.1–1.0)
Monocytes Relative: 10 %
Neutro Abs: 2.9 10*3/uL (ref 1.7–7.7)
Neutrophils Relative %: 62 %
Platelets: 180 10*3/uL (ref 150–400)
RBC: 3.4 MIL/uL — ABNORMAL LOW (ref 3.87–5.11)
RDW: 18.7 % — ABNORMAL HIGH (ref 11.5–15.5)
WBC: 4.6 10*3/uL (ref 4.0–10.5)
nRBC: 0 % (ref 0.0–0.2)

## 2023-06-14 LAB — BASIC METABOLIC PANEL
Anion gap: 15 (ref 5–15)
BUN: 93 mg/dL — ABNORMAL HIGH (ref 6–20)
CO2: 18 mmol/L — ABNORMAL LOW (ref 22–32)
Calcium: 8.3 mg/dL — ABNORMAL LOW (ref 8.9–10.3)
Chloride: 103 mmol/L (ref 98–111)
Creatinine, Ser: 20.8 mg/dL — ABNORMAL HIGH (ref 0.44–1.00)
GFR, Estimated: 2 mL/min — ABNORMAL LOW (ref >60)
Glucose, Bld: 94 mg/dL (ref 70–99)
Potassium: 5.9 mmol/L — ABNORMAL HIGH (ref 3.5–5.1)
Sodium: 136 mmol/L (ref 135–145)

## 2023-06-14 MED ORDER — HYDROMORPHONE HCL 1 MG/ML IJ SOLN
0.5000 mg | Freq: Once | INTRAMUSCULAR | Status: AC
  Administered 2023-06-14: 0.5 mg via INTRAVENOUS
  Filled 2023-06-14: qty 0.5

## 2023-06-14 MED ORDER — DIPHENHYDRAMINE HCL 50 MG/ML IJ SOLN
INTRAMUSCULAR | Status: AC
  Filled 2023-06-14: qty 1

## 2023-06-14 MED ORDER — SODIUM ZIRCONIUM CYCLOSILICATE 5 G PO PACK
5.0000 g | PACK | Freq: Once | ORAL | Status: AC
  Administered 2023-06-14: 5 g via ORAL
  Filled 2023-06-14: qty 1

## 2023-06-14 MED ORDER — ALBUTEROL SULFATE HFA 108 (90 BASE) MCG/ACT IN AERS: 2.0000 | INHALATION_SPRAY | RESPIRATORY_TRACT | Status: DC | PRN

## 2023-06-14 MED ORDER — ALTEPLASE 2 MG IJ SOLR: 2.0000 mg | Freq: Once | INTRAMUSCULAR | Status: DC | PRN

## 2023-06-14 MED ORDER — HEPARIN SODIUM (PORCINE) 1000 UNIT/ML DIALYSIS
1000.0000 [IU] | INTRAMUSCULAR | Status: DC | PRN
  Administered 2023-06-14: 3200 [IU]

## 2023-06-14 MED ORDER — HYDROCODONE-ACETAMINOPHEN 5-325 MG PO TABS
1.0000 | ORAL_TABLET | Freq: Once | ORAL | Status: DC
  Filled 2023-06-14: qty 1

## 2023-06-14 MED ORDER — HEPARIN SODIUM (PORCINE) 1000 UNIT/ML IJ SOLN
INTRAMUSCULAR | Status: AC
  Filled 2023-06-14: qty 8

## 2023-06-14 MED ORDER — DIPHENHYDRAMINE HCL 50 MG/ML IJ SOLN
25.0000 mg | Freq: Once | INTRAMUSCULAR | Status: AC
  Administered 2023-06-14: 25 mg via INTRAVENOUS

## 2023-06-14 MED ORDER — CHLORHEXIDINE GLUCONATE CLOTH 2 % EX PADS: 6.0000 | MEDICATED_PAD | Freq: Every day | CUTANEOUS | Status: DC

## 2023-06-14 MED ORDER — ONDANSETRON HCL 4 MG/2ML IJ SOLN
4.0000 mg | Freq: Once | INTRAMUSCULAR | Status: AC
  Administered 2023-06-14: 4 mg via INTRAVENOUS
  Filled 2023-06-14: qty 2

## 2023-06-14 NOTE — ED Triage Notes (Signed)
Pt arrived POV. Pt is crying stating her stomach hurts and she is short of breath with emesis. Pt states she has not had dialysis since last Sat. Pt states she has not been able to pay for transportation and a Dr was supposed to set a ride up for her yesterday but the ride never showed up.

## 2023-06-14 NOTE — ED Provider Notes (Signed)
[place] EMERGENCY DEPARTMENT AT [facility] Provider Note   CSN: [id number] Arrival date & time: 06/14/23  5784     History  Chief Complaint  Patient presents with   Shortness of Breath   Abdominal Pain    Cathy [name] is a 42 y.o. female.  Patient with end-stage renal disease and lupus.  Patient has not had dialysis for 8 days.  She was post get dialysis yesterday but nobody came to pick her up.  She is complaining of abdominal bloating and some shortness of breath  The history is provided by the patient and medical records. No language interpreter was used.  Shortness of Breath Severity:  Mild Onset quality:  Sudden Timing:  Constant Progression:  Worsening Chronicity:  New Relieved by:  Nothing Worsened by:  Nothing Ineffective treatments:  None tried Associated symptoms: abdominal pain   Associated symptoms: no chest pain, no cough, no headaches and no rash   Abdominal Pain Associated symptoms: shortness of breath   Associated symptoms: no chest pain, no cough, no diarrhea, no fatigue and no hematuria        Home Medications Prior to Admission medications   Medication Sig Start Date End Date Taking? Authorizing Provider  acetaminophen (TYLENOL) 500 MG tablet Take 1,000 mg by mouth every 6 (six) hours as needed for moderate pain.    [provider]  amLODipine (NORVASC) 10 MG tablet Take 1 tablet (10 mg total) by mouth in the morning. 10/27/22   Vassie Loll, MD  B Complex-C-Folic Acid (DIALYVITE 800) 0.8 MG TABS Take 1 tablet by mouth daily. 01/03/23   [provider]  Blood Pressure Monitoring (OMRON 3 SERIES BP MONITOR) DEVI Use as directed 2 times a day 01/09/22   Burnadette Pop, MD  carvedilol (COREG) 25 MG tablet Take 1 tablet (25 mg total) by mouth 2 (two) times daily with a meal. 03/05/22   Tat, Onalee Hua, MD  cloNIDine (CATAPRES) 0.3 MG tablet Take 1 tablet (0.3 mg total) by mouth 3 (three) times daily. 10/27/22   Vassie Loll,  MD  Doxercalciferol (HECTOROL IV) Doxercalciferol (Hectorol) 03/19/23   [provider]  guaiFENesin (MUCINEX PO) Take 2 tablets by mouth 2 (two) times daily as needed (congestion).    [provider]  hydrALAZINE (APRESOLINE) 100 MG tablet Take 1 tablet (100 mg total) by mouth 3 (three) times daily. 03/05/22   Catarina Hartshorn, MD  ipratropium (ATROVENT HFA) 17 MCG/ACT inhaler Inhale 2 puffs into the lungs every 6 (six) hours as needed for wheezing. Patient taking differently: Inhale 2 puffs into the lungs 2 (two) times daily as needed for wheezing (Shortness of breath). 12/17/21 03/23/23  Rai, Delene Ruffini, MD  losartan (COZAAR) 50 MG tablet Take 50 mg by mouth at bedtime. 01/27/23   [provider]  Methoxy PEG-Epoetin Beta (MIRCERA IJ) Mircera 03/10/23 03/08/24  [provider]  ondansetron (ZOFRAN) 4 MG tablet Take 1 tablet (4 mg total) by mouth every 8 (eight) hours as needed for nausea or vomiting. 03/24/23   Early, Kristen Loader, MD  pantoprazole (PROTONIX) 40 MG tablet Take 1 tablet (40 mg total) by mouth daily before breakfast. Patient not taking: Reported on 03/23/2023 10/27/22 12/26/22  Vassie Loll, MD  prochlorperazine (COMPAZINE) 10 MG tablet Take 1 tablet (10 mg total) by mouth 2 (two) times daily as needed for nausea or vomiting. 06/12/23   Rondel Baton, MD  sevelamer carbonate (RENVELA) 800 MG tablet Take 800 mg by mouth 3 (three)  times daily with meals. Patient not taking: Reported on 03/23/2023 08/19/22   [provider]  traMADol (ULTRAM) 50 MG tablet Take 1 tablet (50 mg total) by mouth every 6 (six) hours as needed. 03/24/23   Larina Earthly, MD  VELPHORO 500 MG chewable tablet Chew 1,000 mg by mouth 3 (three) times daily.    [provider]  fenofibrate 160 MG tablet Take 1 tablet (160 mg total) by mouth daily. 12/31/20 01/20/21  Regalado, Jon Billings A, MD      Allergies    Oxycontin [oxycodone] and Dialyvite 800 [nephro-vite]    Review of Systems    Review of Systems  Constitutional:  Negative for appetite change and fatigue.  HENT:  Negative for congestion, ear discharge and sinus pressure.   Eyes:  Negative for discharge.  Respiratory:  Positive for shortness of breath. Negative for cough.   Cardiovascular:  Negative for chest pain.  Gastrointestinal:  Positive for abdominal pain. Negative for diarrhea.  Genitourinary:  Negative for frequency and hematuria.  Musculoskeletal:  Negative for back pain.  Skin:  Negative for rash.  Neurological:  Negative for seizures and headaches.  Psychiatric/Behavioral:  Negative for hallucinations.     Physical Exam Updated Vital Signs BP (!) 153/93   Pulse (!) 49   Temp 97.8 F (36.6 C) (Oral)   Resp 16   Ht 5\' 7"  (1.702 m)   Wt 55.8 kg   LMP  (LMP Unknown) Comment: Per pt, periods stopped in 2020.  SpO2 100%   BMI 19.26 kg/m  Physical Exam Vitals and nursing note reviewed.  Constitutional:      Appearance: She is well-developed.  HENT:     Head: Normocephalic.     Nose: Nose normal.  Eyes:     General: No scleral icterus.    Conjunctiva/sclera: Conjunctivae normal.  Neck:     Thyroid: No thyromegaly.  Cardiovascular:     Rate and Rhythm: Normal rate and regular rhythm.     Heart sounds: No murmur heard.    No friction rub. No gallop.  Pulmonary:     Breath sounds: No stridor. No wheezing or rales.  Chest:     Chest wall: No tenderness.  Abdominal:     General: There is no distension.     Tenderness: There is abdominal tenderness. There is no rebound.  Musculoskeletal:        General: Normal range of motion.     Cervical back: Neck supple.  Lymphadenopathy:     Cervical: No cervical adenopathy.  Skin:    Findings: No erythema or rash.  Neurological:     Mental Status: She is alert and oriented to person, place, and time.     Motor: No abnormal muscle tone.     Coordination: Coordination normal.  Psychiatric:        Behavior: Behavior normal.     ED Results /  Procedures / Treatments   Labs (all labs ordered are listed, but only abnormal results are displayed) Labs Reviewed  CBC WITH DIFFERENTIAL/PLATELET - Abnormal; Notable for the following components:      Result Value   RBC 3.40 (*)    Hemoglobin 10.4 (*)    HCT 32.7 (*)    RDW 18.7 (*)    All other components within normal limits  BASIC METABOLIC PANEL - Abnormal; Notable for the following components:   Potassium 5.9 (*)    CO2 18 (*)    BUN 93 (*)    Creatinine,  Ser 20.80 (*)    Calcium 8.3 (*)    GFR, Estimated 2 (*)    All other components within normal limits  HEPATITIS B SURFACE ANTIGEN  HEPATITIS B SURFACE ANTIBODY, QUANTITATIVE    EKG None  Radiology DG Chest Port 1 View  Result Date: 06/14/2023 CLINICAL DATA:  Shortness of breath EXAM: PORTABLE CHEST 1 VIEW COMPARISON:  10/26/2022 FINDINGS: Right IJ approach hemodialysis catheter terminates at the level of the superior cavoatrial junction. Stable cardiomegaly. Small right pleural effusion with associated right basilar opacity. Left lung appears clear. No pneumothorax. IMPRESSION: Small right pleural effusion with associated right basilar opacity, which may represent atelectasis or pneumonia. Electronically Signed   By: Duanne Guess D.O.   On: 06/14/2023 09:12    Procedures Procedures    Medications Ordered in ED Medications  albuterol (VENTOLIN HFA) 108 (90 Base) MCG/ACT inhaler 2 puff (has no administration in time range)  HYDROcodone-acetaminophen (NORCO/VICODIN) 5-325 MG per tablet 1 tablet (1 tablet Oral Not Given 06/14/23 0902)  Chlorhexidine Gluconate Cloth 2 % PADS 6 each (has no administration in time range)  ondansetron (ZOFRAN) injection 4 mg (4 mg Intravenous Given 06/14/23 0855)  HYDROmorphone (DILAUDID) injection 0.5 mg (0.5 mg Intravenous Given 06/14/23 0901)  sodium zirconium cyclosilicate (LOKELMA) packet 5 g (5 g Oral Given 06/14/23 0945)    ED Course/ Medical Decision Making/ A&P   CRITICAL  CARE Performed by: Bethann Berkshire Total critical care time: 40 minutes Critical care time was exclusive of separately billable procedures and treating other patients. Critical care was necessary to treat or prevent imminent or life-threatening deterioration. Critical care was time spent personally by me on the following activities: development of treatment plan with patient and/or surrogate as well as nursing, discussions with consultants, evaluation of patient's response to treatment, examination of patient, obtaining history from patient or surrogate, ordering and performing treatments and interventions, ordering and review of laboratory studies, ordering and review of radiographic studies, pulse oximetry and re-evaluation of patient's condition.  Click here for ABCD2, HEART and other calculatorsREFRESH Note before signing :1}                          Medical Decision Making Amount and/or Complexity of Data Reviewed Labs: ordered. Radiology: ordered. ECG/medicine tests: ordered.  Risk Prescription drug management.  This patient presents to the ED for concern of shortness of breath and abdominal swelling, this involves an extensive number of treatment options, and is a complaint that carries with it a high risk of complications and morbidity.  The differential diagnosis includes stage renal disease and not getting dialysis for 8 days   Co morbidities that complicate the patient evaluation  End-stage renal disease   Additional history obtained:  Additional history obtained from patient External records from outside source obtained and reviewed including hospital records   Lab Tests:  I Ordered, and personally interpreted labs.  The pertinent results include: Hemoglobin 10.4   Imaging Studies ordered:  I ordered imaging studies including chest x-ray I independently visualized and interpreted imaging which showed right pleural effusion I agree with the radiologist  interpretation   Cardiac Monitoring: / EKG:  The patient was maintained on a cardiac monitor.  I personally viewed and interpreted the cardiac monitored which showed an underlying rhythm of: Sinus rhythm   Consultations Obtained:  I requested consultation with the nephrology,  and discussed lab and imaging findings as well as pertinent plan - they recommend: Dialysis today  Problem List / ED Course / Critical interventions / Medication management  Hyperkalemia and renal failure I ordered medication including Lokelma for hyperkalemia Reevaluation of the patient after these medicines showed that the patient improved I have reviewed the patients home medicines and have made adjustments as needed   Social Determinants of Health:  Dialysis patient   Test / Admission - Considered:  None  Patient with hyperkalemia and fluid overload from not getting dialysis for 8 days.  I spoke with nephrology and the patient will get dialysis today and return to the emergency department for reevaluation and probable disposition Home        Final Clinical Impression(s) / ED Diagnoses Final diagnoses:  None    Rx / DC Orders ED Discharge Orders     None         Bethann Berkshire, MD 06/15/23 1616

## 2023-06-14 NOTE — ED Provider Notes (Signed)
Signout from Dr. Estell Harpin.  42 year old female end-stage renal disease here with increased shortness of breath.  She is up in dialysis and plan is to return here for reassessment and discharge. Physical Exam  BP (!) 191/117 (BP Location: Right Arm)   Pulse 65   Temp 98 F (36.7 C) (Oral)   Resp 19   Ht 5\' 7"  (1.702 m)   Wt 58.7 kg   LMP  (LMP Unknown) Comment: Per pt, periods stopped in 2020.  SpO2 100%   BMI 20.27 kg/m   Physical Exam  Procedures  Procedures  ED Course / MDM    Medical Decision Making Amount and/or Complexity of Data Reviewed Labs: ordered. Radiology: ordered. ECG/medicine tests: ordered.  Risk Prescription drug management.   Patient has returned from dialysis.  Blood pressure elevated but oxygenating well.  She is demanding to be discharged.  She will take her blood pressure medicine when she gets home.       Terrilee Files, MD 06/14/23 1750

## 2023-06-14 NOTE — ED Notes (Signed)
Pt states social work never attempted to reach her Friday afternoon for her transportation plan for Saturday's dialysis and that is why she came here today.

## 2023-06-14 NOTE — Progress Notes (Signed)
  HEMODIALYSIS TREATMENT NOTE:  Uneventful 4 hour heparin-free treatment completed using RIJ TDC at pt's request.  She refused cannulation of AVF, stating "they just started using it last week and it's real sore."  Goal met: 3.7 liters removed without interruption in UF.  Remained hypertensive throughout session.  All blood was returned.  Post-HD:  06/14/23 1715  Vitals  Temp 98 F (36.7 C)  Temp Source Oral  BP (!) 191/117  MAP (mmHg) 140  BP Location Right Arm  BP Method Automatic  Patient Position (if appropriate) Standing  Pulse Rate 65  Pulse Rate Source Monitor  ECG Heart Rate 63  Resp 19  Oxygen Therapy  SpO2 100 %  O2 Device Room Air  Post Treatment  Dialyzer Clearance Lightly streaked  Duration of HD Treatment -hour(s) 4 hour(s)  Hemodialysis Intake (mL) 0 mL  Liters Processed 79.8  Fluid Removed (mL) 3700 mL  Tolerated HD Treatment Yes  Post-Hemodialysis Comments Goal met  Fistula / Graft Left Upper arm Arteriovenous fistula  Placement Date: (c)    Placed prior to admission: Yes  Orientation: Left  Access Location: Upper arm  Access Type: Arteriovenous fistula  Fistula / Graft Assessment Thrill;Bruit  Status Patent  Hemodialysis Catheter Right Internal jugular  Placement Date/Time: 02/01/22 1234   Placed prior to admission: No  Time Out: Correct patient;Correct site;Correct procedure  Maximum sterile barrier precautions: Hand hygiene;Cap;Mask;Sterile gown;Sterile gloves;Large sterile sheet  Site Prep: Chlorh...  Blue Lumen Status Flushed;Heparin locked;Dead end cap in place  Red Lumen Status Flushed;Heparin locked;Dead end cap in place  Purple Lumen Status N/A  Catheter fill solution Heparin 1000 units/ml  Catheter fill volume (Arterial) 1.6 cc  Catheter fill volume (Venous) 1.6  Dressing Type Transparent;Tube stabilization device  Dressing Status Antimicrobial disc in place;Clean, Dry, Intact  Interventions New dressing  Drainage Description None  Dressing  Change Due 06/21/23  Post treatment catheter status Capped and Clamped    Pt was returned to ED-12 to await disposition.  Hand-off given to Sadie Haber, RN.   Arman Filter, RN AP [facility]

## 2023-06-14 NOTE — ED Notes (Signed)
Pt place on 1 liter of oxygen for comfort.

## 2023-06-14 NOTE — ED Notes (Signed)
Pt taken to dialysis 

## 2023-06-15 LAB — HEPATITIS B SURFACE ANTIGEN: Hepatitis B Surface Ag: NONREACTIVE

## 2023-06-16 LAB — HEPATITIS B SURFACE ANTIBODY, QUANTITATIVE: Hep B S AB Quant (Post): 62.3 m[IU]/mL

## 2023-06-25 ENCOUNTER — Encounter (HOSPITAL_COMMUNITY): Payer: Self-pay | Admitting: Emergency Medicine

## 2023-06-25 ENCOUNTER — Encounter: Payer: Self-pay | Admitting: Hematology and Oncology

## 2023-06-25 ENCOUNTER — Other Ambulatory Visit: Payer: Self-pay

## 2023-06-25 ENCOUNTER — Emergency Department (HOSPITAL_COMMUNITY)
Admission: EM | Admit: 2023-06-25 | Discharge: 2023-06-25 | Disposition: A | Discharge status: Home / Self Care | Payer: Medicare HMO | Attending: Emergency Medicine | Admitting: Emergency Medicine

## 2023-06-25 DIAGNOSIS — T82838A Hemorrhage of vascular prosthetic devices, implants and grafts, initial encounter: Secondary | ICD-10-CM | POA: Insufficient documentation

## 2023-06-25 DIAGNOSIS — R58 Hemorrhage, not elsewhere classified: Secondary | ICD-10-CM

## 2023-06-25 DIAGNOSIS — Z992 Dependence on renal dialysis: Secondary | ICD-10-CM | POA: Insufficient documentation

## 2023-06-25 DIAGNOSIS — Z79899 Other long term (current) drug therapy: Secondary | ICD-10-CM | POA: Diagnosis not present

## 2023-06-25 NOTE — Discharge Instructions (Signed)
Keep the bandage on till tomorrow.  Return if any problems

## 2023-06-25 NOTE — ED Notes (Signed)
In with dr Estell Harpin. Took bandage off and area had stopped bleeding, was the lower site to left arm. EDP applied quick clot and wrapped with Coban and advised pt will observe her for a bit. Pt did c/o some dizziness when arrived that now states she is getting better. Nad. Blood cleaned off arm

## 2023-06-25 NOTE — ED Triage Notes (Signed)
Pt finished dialysis today. Went home, took bandage off and started bleeding. Per ems pt has saturated 4 abd pads on way here. A/o.

## 2023-06-25 NOTE — ED Provider Notes (Signed)
[place] EMERGENCY DEPARTMENT AT [facility] Provider Note   CSN: [id number] Arrival date & time: 06/25/23  1630     History  Chief Complaint  Patient presents with   Vascular Access Problem    Cathy [name] is a 42 y.o. female.  Patient presents with bleeding from her dialysis catheter.  She had dialysis today  The history is provided by the patient and medical records. No language interpreter was used.  Weakness Severity:  Mild Onset quality:  Sudden Timing:  Intermittent Progression:  Waxing and waning Chronicity:  Recurrent Context: not alcohol use   Relieved by:  Nothing Worsened by:  Nothing Ineffective treatments:  None tried Associated symptoms: no abdominal pain, no chest pain, no cough, no diarrhea, no frequency, no headaches and no seizures        Home Medications Prior to Admission medications   Medication Sig Start Date End Date Taking? Authorizing Provider  acetaminophen (TYLENOL) 500 MG tablet Take 1,000 mg by mouth every 6 (six) hours as needed for moderate pain.    [provider]  amLODipine (NORVASC) 10 MG tablet Take 1 tablet (10 mg total) by mouth in the morning. 10/27/22   Vassie Loll, MD  B Complex-C-Folic Acid (DIALYVITE 800) 0.8 MG TABS Take 1 tablet by mouth daily. 01/03/23   [provider]  Blood Pressure Monitoring (OMRON 3 SERIES BP MONITOR) DEVI Use as directed 2 times a day 01/09/22   Burnadette Pop, MD  carvedilol (COREG) 25 MG tablet Take 1 tablet (25 mg total) by mouth 2 (two) times daily with a meal. 03/05/22   Tat, Onalee Hua, MD  cloNIDine (CATAPRES) 0.3 MG tablet Take 1 tablet (0.3 mg total) by mouth 3 (three) times daily. 10/27/22   Vassie Loll, MD  Doxercalciferol (HECTOROL IV) Doxercalciferol (Hectorol) 03/19/23   [provider]  guaiFENesin (MUCINEX PO) Take 2 tablets by mouth 2 (two) times daily as needed (congestion).    [provider]  hydrALAZINE (APRESOLINE) 100 MG tablet Take  1 tablet (100 mg total) by mouth 3 (three) times daily. 03/05/22   Catarina Hartshorn, MD  ipratropium (ATROVENT HFA) 17 MCG/ACT inhaler Inhale 2 puffs into the lungs every 6 (six) hours as needed for wheezing. Patient taking differently: Inhale 2 puffs into the lungs 2 (two) times daily as needed for wheezing (Shortness of breath). 12/17/21 03/23/23  Rai, Delene Ruffini, MD  losartan (COZAAR) 50 MG tablet Take 50 mg by mouth at bedtime. 01/27/23   [provider]  Methoxy PEG-Epoetin Beta (MIRCERA IJ) Mircera 03/10/23 03/08/24  [provider]  ondansetron (ZOFRAN) 4 MG tablet Take 1 tablet (4 mg total) by mouth every 8 (eight) hours as needed for nausea or vomiting. 03/24/23   Early, Kristen Loader, MD  pantoprazole (PROTONIX) 40 MG tablet Take 1 tablet (40 mg total) by mouth daily before breakfast. Patient not taking: Reported on 03/23/2023 10/27/22 12/26/22  Vassie Loll, MD  prochlorperazine (COMPAZINE) 10 MG tablet Take 1 tablet (10 mg total) by mouth 2 (two) times daily as needed for nausea or vomiting. 06/12/23   Rondel Baton, MD  sevelamer carbonate (RENVELA) 800 MG tablet Take 800 mg by mouth 3 (three) times daily with meals. Patient not taking: Reported on 03/23/2023 08/19/22   [provider]  traMADol (ULTRAM) 50 MG tablet Take 1 tablet (50 mg total) by mouth every 6 (six) hours as needed. 03/24/23   Larina Earthly, MD  VELPHORO 500 MG chewable tablet Chew 1,000  mg by mouth 3 (three) times daily.    [provider]  fenofibrate 160 MG tablet Take 1 tablet (160 mg total) by mouth daily. 12/31/20 01/20/21  Regalado, Jon Billings A, MD      Allergies    Oxycontin [oxycodone] and Dialyvite 800 [nephro-vite]    Review of Systems   Review of Systems  Constitutional:  Negative for appetite change and fatigue.  HENT:  Negative for congestion, ear discharge and sinus pressure.   Eyes:  Negative for discharge.  Respiratory:  Negative for cough.   Cardiovascular:  Negative for chest pain.   Gastrointestinal:  Negative for abdominal pain and diarrhea.  Genitourinary:  Negative for frequency and hematuria.  Musculoskeletal:  Negative for back pain.       Bleeding from left arm dialysis fistula  Skin:  Negative for rash.  Neurological:  Positive for weakness. Negative for seizures and headaches.  Psychiatric/Behavioral:  Negative for hallucinations.     Physical Exam Updated Vital Signs BP (!) 206/129   Pulse (!) 59   Temp 99.2 F (37.3 C) (Oral)   Resp 18   LMP  (LMP Unknown) Comment: Per pt, periods stopped in 2020.  SpO2 100%  Physical Exam Vitals and nursing note reviewed.  Constitutional:      Appearance: She is well-developed.  HENT:     Head: Normocephalic.     Nose: Nose normal.  Eyes:     General: No scleral icterus.    Conjunctiva/sclera: Conjunctivae normal.  Neck:     Thyroid: No thyromegaly.  Cardiovascular:     Rate and Rhythm: Normal rate and regular rhythm.     Heart sounds: No murmur heard.    No friction rub. No gallop.  Pulmonary:     Breath sounds: No stridor. No wheezing or rales.  Chest:     Chest wall: No tenderness.  Abdominal:     General: There is no distension.     Tenderness: There is no abdominal tenderness. There is no rebound.  Musculoskeletal:        General: Normal range of motion.     Cervical back: Neck supple.     Comments: Mild bleeding open with arm fistula  Lymphadenopathy:     Cervical: No cervical adenopathy.  Skin:    Findings: No erythema or rash.  Neurological:     Mental Status: She is alert and oriented to person, place, and time.     Motor: No abnormal muscle tone.     Coordination: Coordination normal.  Psychiatric:        Behavior: Behavior normal.     ED Results / Procedures / Treatments   Labs (all labs ordered are listed, but only abnormal results are displayed) Labs Reviewed - No data to display  EKG None  Radiology No results found.  Procedures Wound repair  Date/Time: 06/26/2023  5:00 PM  Performed by: Bethann Berkshire, MD Authorized by: Bethann Berkshire, MD  Comments: Patient had bleeding from her dialysis fistula.  Bleeding was controlled with quick clot and a bandage       Medications Ordered in ED Medications - No data to display  ED Course/ Medical Decision Making/ A&P                             Medical Decision Making Bleeding is controlled with quick clot.  She will leave the bandage on until tomorrow and get her dialysis in 2 days  Final Clinical Impression(s) / ED Diagnoses Final diagnoses:  Bleeding    Rx / DC Orders ED Discharge Orders     None         Bethann Berkshire, MD 06/26/23 1701

## 2023-07-05 ENCOUNTER — Other Ambulatory Visit: Payer: Self-pay

## 2023-07-05 ENCOUNTER — Emergency Department (HOSPITAL_COMMUNITY)
Admission: EM | Admit: 2023-07-05 | Discharge: 2023-07-05 | Disposition: A | Discharge status: Home / Self Care | Payer: Medicare HMO | Attending: Emergency Medicine | Admitting: Emergency Medicine

## 2023-07-05 ENCOUNTER — Emergency Department (HOSPITAL_COMMUNITY): Payer: Medicare HMO

## 2023-07-05 ENCOUNTER — Encounter: Payer: Self-pay | Admitting: Hematology and Oncology

## 2023-07-05 ENCOUNTER — Encounter (HOSPITAL_COMMUNITY): Payer: Self-pay | Admitting: *Deleted

## 2023-07-05 DIAGNOSIS — R197 Diarrhea, unspecified: Secondary | ICD-10-CM | POA: Insufficient documentation

## 2023-07-05 DIAGNOSIS — F17210 Nicotine dependence, cigarettes, uncomplicated: Secondary | ICD-10-CM | POA: Diagnosis not present

## 2023-07-05 DIAGNOSIS — R109 Unspecified abdominal pain: Secondary | ICD-10-CM | POA: Diagnosis present

## 2023-07-05 DIAGNOSIS — R112 Nausea with vomiting, unspecified: Secondary | ICD-10-CM | POA: Diagnosis not present

## 2023-07-05 DIAGNOSIS — I5042 Chronic combined systolic (congestive) and diastolic (congestive) heart failure: Secondary | ICD-10-CM | POA: Insufficient documentation

## 2023-07-05 DIAGNOSIS — N186 End stage renal disease: Secondary | ICD-10-CM | POA: Diagnosis not present

## 2023-07-05 DIAGNOSIS — R1084 Generalized abdominal pain: Secondary | ICD-10-CM | POA: Insufficient documentation

## 2023-07-05 DIAGNOSIS — J45909 Unspecified asthma, uncomplicated: Secondary | ICD-10-CM | POA: Insufficient documentation

## 2023-07-05 DIAGNOSIS — Z7951 Long term (current) use of inhaled steroids: Secondary | ICD-10-CM | POA: Diagnosis not present

## 2023-07-05 DIAGNOSIS — R1013 Epigastric pain: Secondary | ICD-10-CM

## 2023-07-05 LAB — COMPREHENSIVE METABOLIC PANEL
ALT: 27 U/L (ref 0–44)
AST: 22 U/L (ref 15–41)
Albumin: 3.3 g/dL — ABNORMAL LOW (ref 3.5–5.0)
Alkaline Phosphatase: 136 U/L — ABNORMAL HIGH (ref 38–126)
Anion gap: 17 — ABNORMAL HIGH (ref 5–15)
BUN: 55 mg/dL — ABNORMAL HIGH (ref 6–20)
CO2: 23 mmol/L (ref 22–32)
Calcium: 8.6 mg/dL — ABNORMAL LOW (ref 8.9–10.3)
Chloride: 96 mmol/L — ABNORMAL LOW (ref 98–111)
Creatinine, Ser: 12.24 mg/dL — ABNORMAL HIGH (ref 0.44–1.00)
GFR, Estimated: 4 mL/min — ABNORMAL LOW (ref >60)
Glucose, Bld: 77 mg/dL (ref 70–99)
Potassium: 5.1 mmol/L (ref 3.5–5.1)
Sodium: 136 mmol/L (ref 135–145)
Total Bilirubin: 0.8 mg/dL (ref 0.3–1.2)
Total Protein: 6.9 g/dL (ref 6.5–8.1)

## 2023-07-05 LAB — URINALYSIS, ROUTINE W REFLEX MICROSCOPIC
Bilirubin Urine: NEGATIVE
Glucose, UA: NEGATIVE mg/dL
Hgb urine dipstick: NEGATIVE
Ketones, ur: NEGATIVE mg/dL
Nitrite: NEGATIVE
Protein, ur: >=300 mg/dL — AB
Specific Gravity, Urine: 1.013 (ref 1.005–1.030)
pH: 8 (ref 5.0–8.0)

## 2023-07-05 LAB — TROPONIN I (HIGH SENSITIVITY)
Troponin I (High Sensitivity): 52 ng/L — ABNORMAL HIGH (ref <18)
Troponin I (High Sensitivity): 55 ng/L — ABNORMAL HIGH (ref <18)

## 2023-07-05 LAB — CBC WITH DIFFERENTIAL/PLATELET
Abs Immature Granulocytes: 0.02 10*3/uL (ref 0.00–0.07)
Basophils Absolute: 0 10*3/uL (ref 0.0–0.1)
Basophils Relative: 0 %
Eosinophils Absolute: 0.3 10*3/uL (ref 0.0–0.5)
Eosinophils Relative: 5 %
HCT: 37.2 % (ref 36.0–46.0)
Hemoglobin: 12.1 g/dL (ref 12.0–15.0)
Immature Granulocytes: 0 %
Lymphocytes Relative: 15 %
Lymphs Abs: 0.8 10*3/uL (ref 0.7–4.0)
MCH: 30.9 pg (ref 26.0–34.0)
MCHC: 32.5 g/dL (ref 30.0–36.0)
MCV: 95.1 fL (ref 80.0–100.0)
Monocytes Absolute: 0.6 10*3/uL (ref 0.1–1.0)
Monocytes Relative: 12 %
Neutro Abs: 3.5 10*3/uL (ref 1.7–7.7)
Neutrophils Relative %: 68 %
Platelets: 157 10*3/uL (ref 150–400)
RBC: 3.91 MIL/uL (ref 3.87–5.11)
RDW: 18 % — ABNORMAL HIGH (ref 11.5–15.5)
WBC: 5.1 10*3/uL (ref 4.0–10.5)
nRBC: 0 % (ref 0.0–0.2)

## 2023-07-05 LAB — LIPASE, BLOOD: Lipase: 39 U/L (ref 11–51)

## 2023-07-05 LAB — POC URINE PREG, ED: Preg Test, Ur: NEGATIVE

## 2023-07-05 MED ORDER — ONDANSETRON HCL 4 MG/2ML IJ SOLN
4.0000 mg | Freq: Once | INTRAMUSCULAR | Status: AC
  Administered 2023-07-05: 4 mg via INTRAVENOUS
  Filled 2023-07-05: qty 2

## 2023-07-05 MED ORDER — FAMOTIDINE 20 MG PO TABS
20.0000 mg | ORAL_TABLET | Freq: Once | ORAL | Status: AC
  Administered 2023-07-05: 20 mg via ORAL
  Filled 2023-07-05: qty 1

## 2023-07-05 MED ORDER — HYDROMORPHONE HCL 1 MG/ML IJ SOLN
0.5000 mg | Freq: Once | INTRAMUSCULAR | Status: AC
  Administered 2023-07-05: 0.5 mg via INTRAVENOUS
  Filled 2023-07-05: qty 0.5

## 2023-07-05 MED ORDER — MAALOX MAX 400-400-40 MG/5ML PO SUSP: 15.0000 mL | Freq: Four times a day (QID) | ORAL | 0 refills | Status: DC | PRN

## 2023-07-05 MED ORDER — LIDOCAINE VISCOUS HCL 2 % MT SOLN
15.0000 mL | Freq: Once | OROMUCOSAL | Status: AC
  Administered 2023-07-05: 15 mL via ORAL
  Filled 2023-07-05: qty 15

## 2023-07-05 MED ORDER — HYDRALAZINE HCL 25 MG PO TABS
100.0000 mg | ORAL_TABLET | Freq: Once | ORAL | Status: AC
  Administered 2023-07-05: 100 mg via ORAL
  Filled 2023-07-05: qty 4

## 2023-07-05 MED ORDER — PANTOPRAZOLE SODIUM 40 MG PO TBEC: 40.0000 mg | DELAYED_RELEASE_TABLET | Freq: Every day | ORAL | 1 refills | Status: DC

## 2023-07-05 MED ORDER — ALUM & MAG HYDROXIDE-SIMETH 200-200-20 MG/5ML PO SUSP
30.0000 mL | Freq: Once | ORAL | Status: AC
  Administered 2023-07-05: 30 mL via ORAL
  Filled 2023-07-05: qty 30

## 2023-07-05 MED ORDER — CLONIDINE HCL 0.2 MG PO TABS
0.3000 mg | ORAL_TABLET | Freq: Once | ORAL | Status: AC
  Administered 2023-07-05: 0.3 mg via ORAL
  Filled 2023-07-05: qty 1

## 2023-07-05 MED ORDER — FUROSEMIDE 10 MG/ML IJ SOLN
120.0000 mg | Freq: Once | INTRAVENOUS | Status: AC
  Administered 2023-07-05: 120 mg via INTRAVENOUS
  Filled 2023-07-05: qty 12

## 2023-07-05 MED ORDER — ONDANSETRON HCL 4 MG PO TABS: 4.0000 mg | ORAL_TABLET | Freq: Three times a day (TID) | ORAL | 0 refills | Status: DC | PRN

## 2023-07-05 MED ORDER — IOHEXOL 350 MG/ML SOLN
100.0000 mL | Freq: Once | INTRAVENOUS | Status: AC | PRN
  Administered 2023-07-05: 100 mL via INTRAVENOUS

## 2023-07-05 NOTE — ED Provider Notes (Signed)
[facility] AT [facility] Provider Note  CSN: [id number] Arrival date & time: 07/05/23 4696  Chief Complaint(s) Abdominal Pain and Chest Pain  HPI MONET NORTH is a 42 y.o. female with history of lupus, end-stage renal disease on dialysis, CHF presenting to the emergency department with abdominal pain.  Patient reports abdominal pain for 2 days.  Reports associated nausea, vomiting, diarrhea.  No hematochezia, hematemesis.  No syncope.  Pain is in the epigastric region.  Pain radiates to the back.  She reports she felt bad yesterday so she did not go to dialysis.  She reports now she is feeling short of breath.   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) 10/27/2022   Gastroesophageal reflux disease    Dysphagia    Abnormal MRI, liver    Cirrhosis of liver with ascites (HCC)    Acute hyperkalemia 06/06/2022   Pruritus 03/18/2022   Abdominal pain 03/17/2022   Ascites 03/02/2022   Volume overload 03/01/2022   MRSA bacteremia 01/29/2022   Tobacco dependence 01/29/2022   History of pulmonary embolus 01/07/2022   Malignant hypertension 01/06/2022   Hypertensive urgency 01/06/2022   Anemia of chronic kidney failure 12/28/2021   Chronic combined systolic and diastolic congestive heart failure (HCC) 12/27/2021   CAP (community acquired  pneumonia) 12/15/2021   Benign essential HTN 06/08/2021   HFrEF (heart failure with reduced ejection fraction) (HCC)    Encounter for antineoplastic chemotherapy 01/07/2021   ESRD on hemodialysis (HCC) 12/23/2020   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 Authorizing Provider  alum & mag hydroxide-simeth (MAALOX MAX) 400-400-40 MG/5ML suspension Take 15 mLs by mouth every 6 (six) hours as needed for indigestion. 07/05/23  Yes Lonell Grandchild, MD  ondansetron (ZOFRAN) 4 MG tablet Take 1 tablet (4 mg total) by mouth every 8 (eight) hours as needed for nausea or vomiting. 07/05/23  Yes Lonell Grandchild, MD  acetaminophen (TYLENOL) 500 MG tablet Take 1,000 mg by mouth every 6 (six) hours as needed for moderate pain.    [provider]  amLODipine (NORVASC) 10 MG tablet Take 1 tablet (10 mg total) by mouth in the morning. 10/27/22   Vassie Loll, MD  B Complex-C-Folic Acid (DIALYVITE 800) 0.8 MG TABS Take 1 tablet by mouth daily. 01/03/23   [provider]  Blood Pressure Monitoring (OMRON  3 SERIES BP MONITOR) DEVI Use as directed 2 times a day 01/09/22   Burnadette Pop, MD  carvedilol (COREG) 25 MG tablet Take 1 tablet (25 mg total) by mouth 2 (two) times daily with a meal. 03/05/22   Tat, Onalee Hua, MD  cloNIDine (CATAPRES) 0.3 MG tablet Take 1 tablet (0.3 mg total) by mouth 3 (three) times daily. 10/27/22   Vassie Loll, MD  Doxercalciferol (HECTOROL IV) Doxercalciferol (Hectorol) 03/19/23   [provider]  guaiFENesin (MUCINEX PO) Take 2 tablets by mouth 2 (two) times daily as needed (congestion).    [provider]  hydrALAZINE (APRESOLINE) 100 MG tablet Take 1 tablet (100 mg total) by mouth 3 (three) times daily. 03/05/22   Catarina Hartshorn, MD  ipratropium (ATROVENT HFA) 17 MCG/ACT inhaler Inhale 2 puffs into the lungs every 6 (six) hours as needed for wheezing. Patient taking differently: Inhale 2 puffs into the lungs 2 (two) times daily as needed for wheezing (Shortness of breath). 12/17/21 03/23/23  Rai, Delene Ruffini, MD  losartan (COZAAR) 50 MG tablet Take 50 mg by mouth at bedtime. 01/27/23   [provider]  Methoxy PEG-Epoetin Beta (MIRCERA IJ) Mircera 03/10/23 03/08/24  [provider]  pantoprazole (PROTONIX) 40 MG tablet Take 1 tablet (40 mg total) by mouth daily before breakfast. 07/05/23 09/03/23  Lonell Grandchild, MD  prochlorperazine (COMPAZINE) 10 MG tablet Take 1 tablet (10 mg total) by mouth 2 (two) times daily as needed for nausea or vomiting. 06/12/23   Rondel Baton, MD  sevelamer carbonate (RENVELA) 800 MG tablet Take 800 mg by mouth 3 (three) times daily with meals. Patient not taking: Reported on 03/23/2023 08/19/22   [provider]  traMADol (ULTRAM) 50 MG tablet Take 1 tablet (50 mg total) by mouth every 6 (six) hours as needed. 03/24/23   Larina Earthly, MD  VELPHORO 500 MG chewable tablet Chew 1,000 mg by mouth 3 (three) times daily.    [provider]  fenofibrate 160 MG tablet Take 1 tablet (160 mg total) by mouth daily. 12/31/20 01/20/21  Alba Cory, MD                                                                                                                                    Past Surgical History Past Surgical History:  Procedure Laterality Date   AV FISTULA PLACEMENT Left 06/10/2022   Procedure: LEFT ARM ARTERIOVENOUS (AV) FISTULA CREATION;  Surgeon: Larina Earthly, MD;  Location: [facility];  Service: Vascular;   Laterality: Left;   BASCILIC VEIN TRANSPOSITION Left 03/24/2023   Procedure: LEFT ARM SECOND STAGE BASILIC VEIN TRANSPOSITION;  Surgeon: Larina Earthly, MD;  Location: [facility];  Service: Vascular;  Laterality: Left;   IR FLUORO GUIDE CV LINE RIGHT  11/30/2020   IR FLUORO GUIDE CV LINE RIGHT  06/13/2021   IR FLUORO GUIDE CV LINE RIGHT  02/01/2022  IR REMOVAL TUN CV CATH W/O FL  06/11/2021   IR REMOVAL TUN CV CATH W/O FL  01/29/2022   IR US GUIDE VASC ACCESS RIGHT  11/30/2020   IR US GUIDE VASC ACCESS RIGHT  06/13/2021   IR US GUIDE VASC ACCESS RIGHT  02/03/2022   RENAL BIOPSY     TEE WITHOUT CARDIOVERSION N/A 06/13/2021   Procedure: TRANSESOPHAGEAL ECHOCARDIOGRAM (TEE);  Surgeon: Wendall Stade, MD;  Location: [facility] ENDOSCOPY;  Service: Cardiovascular;  Laterality: N/A;   TEE WITHOUT CARDIOVERSION N/A 02/03/2022   Procedure: TRANSESOPHAGEAL ECHOCARDIOGRAM (TEE);  Surgeon: Thomasene Ripple, DO;  Location: [facility] ENDOSCOPY;  Service: Cardiovascular;  Laterality: N/A;   TUBAL LIGATION N/A 02/03/2019   Procedure: POST PARTUM TUBAL LIGATION;  Surgeon: Jennings Bing, MD;  Location: [facility] LD ORS;  Service: Gynecology;  Laterality: N/A;   Family History Family History  Problem Relation Age of Onset   Diabetes Maternal Grandmother    Hypertension Maternal Grandmother    Diabetes Maternal Grandfather    Hypertension Maternal Grandfather    Diabetes Paternal Grandmother    Hypertension Paternal Grandmother    Diabetes Paternal Grandfather    Hypertension Paternal Grandfather     Social History Social History   Tobacco Use   Smoking status: Some Days    Current packs/day: 0.25    Types: Cigarettes   Smokeless tobacco: Never   Tobacco comments:    1-2 cigarettes daily   Vaping Use   Vaping status: Never Used  Substance Use Topics   Alcohol use: Not Currently   Drug use: Yes    Types: Marijuana    Comment: 3 times a week    Allergies Oxycontin [oxycodone] and Dialyvite 800 [nephro-vite]  Review of  Systems Review of Systems  All other systems reviewed and are negative.   Physical Exam Vital Signs  I have reviewed the triage vital signs BP (!) 207/114   Pulse 71   Temp 97.8 F (36.6 C) (Oral)   Resp 14   Ht 5\' 7"  (1.702 m)   Wt 61.1 kg   LMP  (LMP Unknown) Comment: Per pt, periods stopped in 2020.  SpO2 97%   BMI 21.10 kg/m  Physical Exam Vitals and nursing note reviewed.  Constitutional:      General: She is not in acute distress.    Appearance: She is well-developed.  HENT:     Head: Normocephalic and atraumatic.     Mouth/Throat:     Mouth: Mucous membranes are moist.  Eyes:     Pupils: Pupils are equal, round, and reactive to light.  Cardiovascular:     Rate and Rhythm: Normal rate and regular rhythm.     Heart sounds: No murmur heard. Pulmonary:     Effort: Pulmonary effort is normal. No respiratory distress.     Breath sounds: Normal breath sounds.  Abdominal:     General: Abdomen is flat.     Palpations: Abdomen is soft.     Tenderness: There is generalized abdominal tenderness and tenderness in the epigastric area.  Musculoskeletal:        General: No tenderness.     Right lower leg: No edema.     Left lower leg: No edema.  Skin:    General: Skin is warm and dry.  Neurological:     General: No focal deficit present.     Mental Status: She is alert. Mental status is at baseline.  Psychiatric:        Mood and Affect:  Mood normal.        Behavior: Behavior normal.     ED Results and Treatments Labs (all labs ordered are listed, but only abnormal results are displayed) Labs Reviewed  COMPREHENSIVE METABOLIC PANEL - Abnormal; Notable for the following components:      Result Value   Chloride 96 (*)    BUN 55 (*)    Creatinine, Ser 12.24 (*)    Calcium 8.6 (*)    Albumin 3.3 (*)    Alkaline Phosphatase 136 (*)    GFR, Estimated 4 (*)    Anion gap 17 (*)    All other components within normal limits  CBC WITH DIFFERENTIAL/PLATELET - Abnormal;  Notable for the following components:   RDW 18.0 (*)    All other components within normal limits  URINALYSIS, ROUTINE W REFLEX MICROSCOPIC - Abnormal; Notable for the following components:   APPearance CLOUDY (*)    Protein, ur >=300 (*)    Leukocytes,Ua SMALL (*)    Bacteria, UA MANY (*)    All other components within normal limits  TROPONIN I (HIGH SENSITIVITY) - Abnormal; Notable for the following components:   Troponin I (High Sensitivity) 52 (*)    All other components within normal limits  TROPONIN I (HIGH SENSITIVITY) - Abnormal; Notable for the following components:   Troponin I (High Sensitivity) 55 (*)    All other components within normal limits  LIPASE, BLOOD  POC URINE PREG, ED                                                                                                                          Radiology CT Angio Chest/Abd/Pel for Dissection W and/or W/WO  Result Date: 07/05/2023 CLINICAL DATA:  Acute aortic syndrome. EXAM: CT ANGIOGRAPHY CHEST, ABDOMEN AND PELVIS TECHNIQUE: Non-contrast CT of the chest was initially obtained. Multidetector CT imaging through the chest, abdomen and pelvis was performed using the standard protocol during bolus administration of intravenous contrast. Multiplanar reconstructed images and MIPs were obtained and reviewed to evaluate the vascular anatomy. RADIATION DOSE REDUCTION: This exam was performed according to the departmental dose-optimization program which includes automated exposure control, adjustment of the mA and/or kV according to patient size and/or use of iterative reconstruction technique. CONTRAST:  OMNIPAQUE IOHEXOL 350 MG/ML SOLN COMPARISON:  Abdomen and pelvis CT 08/09/2022 FINDINGS: CTA CHEST FINDINGS Cardiovascular: Pre contrast imaging shows no hyperdense crescent in the wall of the thoracic aorta to suggest the presence of an acute intramural hematoma. No thoracic aortic aneurysm. No dissection of the thoracic aorta.  Aortic arch branch vessel anatomy is widely patent. Right IJ central line tip is positioned in the distal SVC near the junction with the RA. No pericardial effusion. Enlargement of the pulmonary outflow tract/main pulmonary arteries suggests pulmonary arterial hypertension. Mediastinum/Nodes: No mediastinal lymphadenopathy. There is no hilar lymphadenopathy. The esophagus has normal imaging features. Upper normal lymph nodes are seen in the axillary regions bilaterally. Lungs/Pleura: Collapse/consolidation is seen  in both lower lungs with small right and moderate left pleural effusions. Musculoskeletal: No worrisome lytic or sclerotic osseous abnormality. Review of the MIP images confirms the above findings. CTA ABDOMEN AND PELVIS FINDINGS VASCULAR Aorta: Normal caliber aorta without aneurysm, dissection, vasculitis or significant stenosis. Celiac: Patent without evidence of aneurysm, dissection, vasculitis or significant stenosis. SMA: Patent without evidence of aneurysm, dissection, vasculitis or significant stenosis. Renals: Both renal arteries are patent without evidence of aneurysm, dissection, vasculitis, fibromuscular dysplasia or significant stenosis. IMA: Patent without evidence of aneurysm, dissection, vasculitis or significant stenosis. Inflow: Patent without evidence of aneurysm, dissection, vasculitis or significant stenosis. Veins: No obvious venous abnormality within the limitations of this arterial phase study. Review of the MIP images confirms the above findings. NON-VASCULAR Hepatobiliary: Markedly heterogeneous appearance of the liver parenchyma on arterial phase imaging could reflect changes of fatty deposition or vascular congestion or combination of both. There is no evidence for gallstones, gallbladder wall thickening, or pericholecystic fluid. No intrahepatic or extrahepatic biliary dilation. Pancreas: Ill-defined 8 mm hypoattenuating lesion in the pancreatic head is similar to prior. This  was characterized as a 7 mm unilocular cyst on MRCP 08/11/2022 . Spleen: No splenomegaly. No suspicious focal mass lesion. Adrenals/Urinary Tract: No adrenal nodule or mass. Kidneys unremarkable. No evidence for hydroureter. Bladder is decompressed. Stomach/Bowel: Stomach is unremarkable. No gastric wall thickening. No evidence of outlet obstruction. Duodenum is normally positioned as is the ligament of Treitz. No small bowel wall thickening. No small bowel dilatation. The appendix is normal. No gross colonic mass. No colonic wall thickening. Lymphatic: There is no gastrohepatic or hepatoduodenal ligament lymphadenopathy. Mild left para-aortic lymphadenopathy is stable occluding 11 mm short axis left para-aortic node on 01/30 3/5. 12 mm short axis left common iliac node on 141/5 is similar to prior. Reproductive: Unremarkable. Other: Small volume free fluid seen in the pelvis. Mesenteric and body wall edema evident. Musculoskeletal: No worrisome lytic or sclerotic osseous abnormality. Review of the MIP images confirms the above findings. IMPRESSION: 1. No evidence for thoracic or abdominal aortic aneurysm or dissection. 2. Enlargement of the pulmonary outflow tract/main pulmonary arteries suggests pulmonary arterial hypertension. 3. Collapse/consolidation in both lower lungs with small right and moderate left pleural effusions. 4. Markedly heterogeneous appearance of the liver parenchyma on arterial phase imaging could reflect changes of fatty deposition or vascular congestion or a combination of both of both or combination. 5. Stable mild left para-aortic and left common iliac lymphadenopathy. 6. Small volume free fluid in the pelvis. 7. Mesenteric and body wall edema. 8. Stable 8 mm hypoattenuating lesion in the pancreatic head. This was characterized as a 7 mm unilocular cyst on MRCP 08/11/2022. Electronically Signed   By: Kennith Center M.D.   On: 07/05/2023 13:14   DG Chest Portable 1 View  Result Date:  07/05/2023 CLINICAL DATA:  Chest pain and shortness of breath. EXAM: PORTABLE CHEST 1 VIEW COMPARISON:  Chest x-ray dated June 14, 2023. FINDINGS: Unchanged tunneled right internal jugular dialysis catheter. Stable cardiomegaly. Normal pulmonary vascularity. Possible small left pleural effusion. Unchanged scarring at the peripheral right lung base and costophrenic angle. No pneumothorax. No acute osseous abnormality. IMPRESSION: 1. Possible small left pleural effusion. Electronically Signed   By: Obie Dredge M.D.   On: 07/05/2023 11:32    Pertinent labs & imaging results that were available during my care of the patient were reviewed by me and considered in my medical decision making (see MDM for details).  Medications Ordered in ED Medications  furosemide (LASIX) 120 mg in dextrose 5 % 50 mL IVPB (has no administration in time range)  HYDROmorphone (DILAUDID) injection 0.5 mg (0.5 mg Intravenous Given 07/05/23 1213)  ondansetron (ZOFRAN) injection 4 mg (4 mg Intravenous Given 07/05/23 1212)  hydrALAZINE (APRESOLINE) tablet 100 mg (100 mg Oral Given 07/05/23 1212)  iohexol (OMNIPAQUE) 350 MG/ML injection 100 mL (100 mLs Intravenous Contrast Given 07/05/23 1246)  HYDROmorphone (DILAUDID) injection 0.5 mg (0.5 mg Intravenous Given 07/05/23 1430)  cloNIDine (CATAPRES) tablet 0.3 mg (0.3 mg Oral Given 07/05/23 1548)  alum & mag hydroxide-simeth (MAALOX/MYLANTA) 200-200-20 MG/5ML suspension 30 mL (30 mLs Oral Given 07/05/23 1551)    And  lidocaine (XYLOCAINE) 2 % viscous mouth solution 15 mL (15 mLs Oral Given 07/05/23 1551)  ondansetron (ZOFRAN) injection 4 mg (4 mg Intravenous Given 07/05/23 1551)  famotidine (PEPCID) tablet 20 mg (20 mg Oral Given 07/05/23 1551)                                                                                                                                     Procedures Procedures  (including critical care time)  Medical Decision Making / ED Course   MDM:  42 year old  female presenting to the emergency department with abdominal pain.  Patient overall well-appearing, vitals notable for significant hypertension.  Unclear cause of symptoms, could be pancreatitis, gastroenteritis, possibly aortic process given radiation of the back and hypertension, possibly obstruction although she is still having bowel movements, other intra-abdominal process such as abscess, volvulus, perforation.  Patient also missed dialysis session.  Will check labs to further evaluate as well as CT chest abdomen pelvis to evaluate for dissection or other intra-abdominal process.  Will reassess.  Clinical Course as of 07/05/23 1552  Sun Jul 05, 2023  1549 Imaging negative.  Discussed with Dr. Juel Burrow.  Dialysis availability limited, given patient not hypoxic or without electrolyte derangements, recommend giving Lasix, having patient call dialysis center tomorrow to try to get dialyzed.  Patient understands.  She also reports that the pain is more burning in nature, may represent gastritis.  She reports she has been taking ibuprofen which has not helped.  Discussed that this may be making her symptoms worse.  She was prescribed pantoprazole previously but not taking this.  Will refill her prescription as well as Maalox and Zofran.  Discussed strict return precautions for any worsening symptoms or if she develops any difficulty breathing, lightheadedness, fainting, or any other concerning symptoms. [WS]    Clinical Course User Index [WS] Lonell Grandchild, MD     Additional history obtained: -External records from outside source obtained and reviewed including: Chart review including previous notes, labs, imaging, consultation notes including previous ER visits for similar abdominal pain   Lab Tests: -I ordered, reviewed, and interpreted labs.   The pertinent results include:   Labs Reviewed  COMPREHENSIVE METABOLIC PANEL - Abnormal; Notable for the following components:  Result Value    Chloride 96 (*)    BUN 55 (*)    Creatinine, Ser 12.24 (*)    Calcium 8.6 (*)    Albumin 3.3 (*)    Alkaline Phosphatase 136 (*)    GFR, Estimated 4 (*)    Anion gap 17 (*)    All other components within normal limits  CBC WITH DIFFERENTIAL/PLATELET - Abnormal; Notable for the following components:   RDW 18.0 (*)    All other components within normal limits  URINALYSIS, ROUTINE W REFLEX MICROSCOPIC - Abnormal; Notable for the following components:   APPearance CLOUDY (*)    Protein, ur >=300 (*)    Leukocytes,Ua SMALL (*)    Bacteria, UA MANY (*)    All other components within normal limits  TROPONIN I (HIGH SENSITIVITY) - Abnormal; Notable for the following components:   Troponin I (High Sensitivity) 52 (*)    All other components within normal limits  TROPONIN I (HIGH SENSITIVITY) - Abnormal; Notable for the following components:   Troponin I (High Sensitivity) 55 (*)    All other components within normal limits  LIPASE, BLOOD  POC URINE PREG, ED    Notable for stable troponin elevation in setting of ESRD and missed dialysis , similar to previous. Normal lipase  EKG   EKG Interpretation Date/Time:  Sunday July 05 2023 11:03:55 EDT Ventricular Rate:  64 PR Interval:  155 QRS Duration:  85 QT Interval:  477 QTC Calculation: 493 R Axis:   73  Text Interpretation: Sinus rhythm Left atrial enlargement LVH with secondary repolarization abnormality Anterior ST elevation, probably due to LVH Borderline prolonged QT interval Baseline wander in lead(s) V3 similar finding in v2-3 to ECG 26 nov 23 Confirmed by Alvino Blood ([id number]) on 07/05/2023 11:16:16 AM         Imaging Studies ordered: I ordered imaging studies including CTA chest/abdomen/pelvis On my interpretation imaging demonstrates no dissection or acute findings I independently visualized and interpreted imaging. I agree with the radiologist interpretation   Medicines ordered and prescription drug  management: Meds ordered this encounter  Medications   HYDROmorphone (DILAUDID) injection 0.5 mg   ondansetron (ZOFRAN) injection 4 mg   hydrALAZINE (APRESOLINE) tablet 100 mg   iohexol (OMNIPAQUE) 350 MG/ML injection 100 mL   HYDROmorphone (DILAUDID) injection 0.5 mg   furosemide (LASIX) 120 mg in dextrose 5 % 50 mL IVPB   cloNIDine (CATAPRES) tablet 0.3 mg   AND Linked Order Group    alum & mag hydroxide-simeth (MAALOX/MYLANTA) 200-200-20 MG/5ML suspension 30 mL    lidocaine (XYLOCAINE) 2 % viscous mouth solution 15 mL   ondansetron (ZOFRAN) injection 4 mg   famotidine (PEPCID) tablet 20 mg   alum & mag hydroxide-simeth (MAALOX MAX) 400-400-40 MG/5ML suspension    Sig: Take 15 mLs by mouth every 6 (six) hours as needed for indigestion.    Dispense:  355 mL    Refill:  0   ondansetron (ZOFRAN) 4 MG tablet    Sig: Take 1 tablet (4 mg total) by mouth every 8 (eight) hours as needed for nausea or vomiting.    Dispense:  12 tablet    Refill:  0   pantoprazole (PROTONIX) 40 MG tablet    Sig: Take 1 tablet (40 mg total) by mouth daily before breakfast.    Dispense:  30 tablet    Refill:  1    -I have reviewed the patients home medicines and have made adjustments as needed  Consultations Obtained: I requested consultation with the nephrologist,  and discussed lab and imaging findings as well as pertinent plan - they recommend: outpatient dialysis, return to ER if worsening    Cardiac Monitoring: The patient was maintained on a cardiac monitor.  I personally viewed and interpreted the cardiac monitored which showed an underlying rhythm of: NSR  Social Determinants of Health:  Diagnosis or treatment significantly limited by social determinants of health: current smoker   Reevaluation: After the interventions noted above, I reevaluated the patient and found that their symptoms have improved  Co morbidities that complicate the patient evaluation  Past Medical History:   Diagnosis Date   Anasarca associated with disorder of kidney 06/08/2021   Anxiety    Asthma    Bipolar depression (HCC)    Depression    Dyspnea    ESRD (end stage renal disease) on dialysis (HCC) 10/2020   GERD (gastroesophageal reflux disease)    Gonorrhea    Lupus (HCC)    Pelvic inflammatory disease (PID)    Pleural effusion 06/08/2021   Pulmonary embolism (HCC) 01/31/2022   Renal hypertension    Trichomonas infection       Dispostion: Disposition decision including need for hospitalization was considered, and patient discharged from emergency department.    Final Clinical Impression(s) / ED Diagnoses Final diagnoses:  Epigastric pain     This chart was dictated using voice recognition software.  Despite best efforts to proofread,  errors can occur which can change the documentation meaning.    Lonell Grandchild, MD 07/05/23 [id number]

## 2023-07-05 NOTE — Discharge Instructions (Addendum)
We evaluated you for your abdominal pain.  Your testing was reassuring.  Your CT scan did not show any dangerous problems.  I think the likely cause of your symptoms is reflux.  Please stop taking Motrin as this can make this type of symptom worse.  Please take Maalox every 6 hours as needed for pain.  Please also take pantoprazole daily.  I have also prescribed you nausea medication which you can take once every 8 hours as needed for nausea or vomiting.  Please call your dialysis center tomorrow to help schedule dialysis since you missed your session yesterday.  If you have any worsening symptoms, please return to the emergency department for reassessment.

## 2023-07-05 NOTE — ED Notes (Signed)
Patient Alert and oriented to baseline. Stable and ambulatory to baseline. Patient verbalized understanding of the discharge instructions.  Patient belongings were taken by the patient.   

## 2023-07-05 NOTE — ED Triage Notes (Signed)
Pt with mid to left CP, sob and abd pain for past couple of days.  Bloated abdomen. + N/V/D.  "Cold sweats" at night. Last dialysis was Thursday, unable to make yesterday's appt for feeling so bad per pt.

## 2023-07-05 NOTE — ED Notes (Signed)
Medicated per [facility] Pt waiting for CT

## 2023-07-05 NOTE — ED Notes (Signed)
Pt called out for pain medication. MD notified,

## 2023-07-08 ENCOUNTER — Encounter (HOSPITAL_COMMUNITY): Payer: Self-pay

## 2023-07-08 ENCOUNTER — Emergency Department (HOSPITAL_COMMUNITY): Payer: Medicare HMO

## 2023-07-08 ENCOUNTER — Other Ambulatory Visit: Payer: Self-pay

## 2023-07-08 ENCOUNTER — Emergency Department (HOSPITAL_COMMUNITY)
Admission: EM | Admit: 2023-07-08 | Discharge: 2023-07-08 | Disposition: A | Discharge status: Home / Self Care | Payer: Medicare HMO | Attending: Student | Admitting: Student

## 2023-07-08 DIAGNOSIS — R0602 Shortness of breath: Secondary | ICD-10-CM | POA: Insufficient documentation

## 2023-07-08 DIAGNOSIS — Z20822 Contact with and (suspected) exposure to covid-19: Secondary | ICD-10-CM | POA: Diagnosis not present

## 2023-07-08 DIAGNOSIS — J45909 Unspecified asthma, uncomplicated: Secondary | ICD-10-CM | POA: Diagnosis not present

## 2023-07-08 DIAGNOSIS — Z992 Dependence on renal dialysis: Secondary | ICD-10-CM | POA: Insufficient documentation

## 2023-07-08 DIAGNOSIS — N186 End stage renal disease: Secondary | ICD-10-CM | POA: Diagnosis not present

## 2023-07-08 DIAGNOSIS — J90 Pleural effusion, not elsewhere classified: Secondary | ICD-10-CM | POA: Diagnosis not present

## 2023-07-08 DIAGNOSIS — R109 Unspecified abdominal pain: Secondary | ICD-10-CM | POA: Diagnosis present

## 2023-07-08 DIAGNOSIS — E871 Hypo-osmolality and hyponatremia: Secondary | ICD-10-CM | POA: Diagnosis not present

## 2023-07-08 DIAGNOSIS — E875 Hyperkalemia: Secondary | ICD-10-CM | POA: Diagnosis not present

## 2023-07-08 DIAGNOSIS — R1084 Generalized abdominal pain: Secondary | ICD-10-CM | POA: Insufficient documentation

## 2023-07-08 LAB — COMPREHENSIVE METABOLIC PANEL
ALT: 31 U/L (ref 0–44)
AST: 26 U/L (ref 15–41)
Albumin: 3.4 g/dL — ABNORMAL LOW (ref 3.5–5.0)
Alkaline Phosphatase: 162 U/L — ABNORMAL HIGH (ref 38–126)
Anion gap: 16 — ABNORMAL HIGH (ref 5–15)
BUN: 81 mg/dL — ABNORMAL HIGH (ref 6–20)
CO2: 24 mmol/L (ref 22–32)
Calcium: 8 mg/dL — ABNORMAL LOW (ref 8.9–10.3)
Chloride: 94 mmol/L — ABNORMAL LOW (ref 98–111)
Creatinine, Ser: 16.71 mg/dL — ABNORMAL HIGH (ref 0.44–1.00)
GFR, Estimated: 3 mL/min — ABNORMAL LOW (ref >60)
Glucose, Bld: 96 mg/dL (ref 70–99)
Potassium: 5.5 mmol/L — ABNORMAL HIGH (ref 3.5–5.1)
Sodium: 134 mmol/L — ABNORMAL LOW (ref 135–145)
Total Bilirubin: 0.9 mg/dL (ref 0.3–1.2)
Total Protein: 7 g/dL (ref 6.5–8.1)

## 2023-07-08 LAB — TROPONIN I (HIGH SENSITIVITY)
Troponin I (High Sensitivity): 44 ng/L — ABNORMAL HIGH (ref <18)
Troponin I (High Sensitivity): 48 ng/L — ABNORMAL HIGH (ref <18)

## 2023-07-08 LAB — RESP PANEL BY RT-PCR (RSV, FLU A&B, COVID)  RVPGX2
Influenza A by PCR: NEGATIVE
Influenza B by PCR: NEGATIVE
Resp Syncytial Virus by PCR: NEGATIVE
SARS Coronavirus 2 by RT PCR: NEGATIVE

## 2023-07-08 LAB — CBC
HCT: 33.2 % — ABNORMAL LOW (ref 36.0–46.0)
Hemoglobin: 10.8 g/dL — ABNORMAL LOW (ref 12.0–15.0)
MCH: 31 pg (ref 26.0–34.0)
MCHC: 32.5 g/dL (ref 30.0–36.0)
MCV: 95.4 fL (ref 80.0–100.0)
Platelets: 177 10*3/uL (ref 150–400)
RBC: 3.48 MIL/uL — ABNORMAL LOW (ref 3.87–5.11)
RDW: 18 % — ABNORMAL HIGH (ref 11.5–15.5)
WBC: 5.8 10*3/uL (ref 4.0–10.5)
nRBC: 0 % (ref 0.0–0.2)

## 2023-07-08 LAB — LIPASE, BLOOD: Lipase: 43 U/L (ref 11–51)

## 2023-07-08 LAB — HEPATITIS B SURFACE ANTIGEN: Hepatitis B Surface Ag: NONREACTIVE

## 2023-07-08 MED ORDER — LACTATED RINGERS IV BOLUS
500.0000 mL | Freq: Once | INTRAVENOUS | Status: AC
  Administered 2023-07-08: 500 mL via INTRAVENOUS

## 2023-07-08 MED ORDER — FENTANYL CITRATE PF 50 MCG/ML IJ SOSY
50.0000 ug | PREFILLED_SYRINGE | Freq: Once | INTRAMUSCULAR | Status: AC
  Administered 2023-07-08: 50 ug via INTRAVENOUS
  Filled 2023-07-08: qty 1

## 2023-07-08 MED ORDER — ALTEPLASE 2 MG IJ SOLR: 2.0000 mg | Freq: Once | INTRAMUSCULAR | Status: DC | PRN

## 2023-07-08 MED ORDER — DOXERCALCIFEROL 4 MCG/2ML IV SOLN
7.0000 ug | Freq: Once | INTRAVENOUS | Status: AC
  Administered 2023-07-08: 7 ug via INTRAVENOUS
  Filled 2023-07-08: qty 4

## 2023-07-08 MED ORDER — DIAZEPAM 5 MG/ML IJ SOLN
5.0000 mg | Freq: Once | INTRAMUSCULAR | Status: AC
  Administered 2023-07-08: 5 mg via INTRAVENOUS
  Filled 2023-07-08: qty 2

## 2023-07-08 MED ORDER — FAMOTIDINE IN NACL 20-0.9 MG/50ML-% IV SOLN
20.0000 mg | Freq: Once | INTRAVENOUS | Status: AC
  Administered 2023-07-08: 20 mg via INTRAVENOUS
  Filled 2023-07-08: qty 50

## 2023-07-08 MED ORDER — HEPARIN SODIUM (PORCINE) 1000 UNIT/ML IJ SOLN
INTRAMUSCULAR | Status: AC
  Filled 2023-07-08: qty 6

## 2023-07-08 MED ORDER — ONDANSETRON HCL 4 MG/2ML IJ SOLN
4.0000 mg | Freq: Once | INTRAMUSCULAR | Status: AC
  Administered 2023-07-08: 4 mg via INTRAVENOUS
  Filled 2023-07-08: qty 2

## 2023-07-08 MED ORDER — DOXERCALCIFEROL 4 MCG/2ML IV SOLN
INTRAVENOUS | Status: AC
  Filled 2023-07-08: qty 4

## 2023-07-08 MED ORDER — DIPHENHYDRAMINE HCL 50 MG/ML IJ SOLN: 25.0000 mg | Freq: Once | INTRAMUSCULAR | Status: DC

## 2023-07-08 MED ORDER — CHLORHEXIDINE GLUCONATE CLOTH 2 % EX PADS: 6.0000 | MEDICATED_PAD | Freq: Every day | CUTANEOUS | Status: DC

## 2023-07-08 MED ORDER — DOXERCALCIFEROL 4 MCG/2ML IV SOLN
7.0000 ug | INTRAVENOUS | Status: DC
  Filled 2023-07-08: qty 4

## 2023-07-08 MED ORDER — PROCHLORPERAZINE EDISYLATE 10 MG/2ML IJ SOLN
10.0000 mg | Freq: Once | INTRAMUSCULAR | Status: AC
  Administered 2023-07-08: 10 mg via INTRAVENOUS
  Filled 2023-07-08: qty 2

## 2023-07-08 MED ORDER — HEPARIN SODIUM (PORCINE) 1000 UNIT/ML DIALYSIS
1000.0000 [IU] | INTRAMUSCULAR | Status: DC | PRN
  Administered 2023-07-08: 3800 [IU]

## 2023-07-08 NOTE — ED Provider Notes (Signed)
EMERGENCY DEPARTMENT AT [facility] Provider Note  CSN: [id number] Arrival date & time: 07/08/23 0630  Chief Complaint(s) Abdominal Pain and missed dialysis  HPI Cathy [name] is a 42 y.o. female with PMH ESRD on hemodialysis, bipolar depression, GERD, lupus, previous PE not currently on anticoagulation who presents emergency room for evaluation of abdominal pain, chest pain, shortness of breath and myalgias.  She has not had dialysis in almost 1 week.  Patient was seen on 07/05/2023 for similar complaints where she received an extensive workup including a dissection study that showed a moderate left pleural effusion but was otherwise negative for acute pathology.  Plan was for outpatient dialysis at ER discharge but patient had a court date yesterday that interfered with getting dialysis and the dialysis center was "shortstaffed" and recommended that she come to the emergency department for dialysis.   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) 10/27/2022   Gastroesophageal reflux disease    Dysphagia    Abnormal MRI, liver    Cirrhosis of liver with ascites (HCC)    Acute hyperkalemia 06/06/2022   Pruritus 03/18/2022   Abdominal pain 03/17/2022   Ascites 03/02/2022   Volume overload 03/01/2022   MRSA bacteremia 01/29/2022   Tobacco dependence  01/29/2022   History of pulmonary embolus 01/07/2022   Malignant hypertension 01/06/2022   Hypertensive urgency 01/06/2022   Anemia of chronic kidney failure 12/28/2021   Chronic combined systolic and diastolic congestive heart failure (HCC) 12/27/2021   CAP (community acquired pneumonia) 12/15/2021   Benign essential HTN 06/08/2021   HFrEF (heart failure with reduced ejection fraction) (HCC)    Encounter for antineoplastic chemotherapy 01/07/2021   ESRD on hemodialysis (HCC) 12/23/2020   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 Authorizing Provider  acetaminophen (TYLENOL) 500 MG tablet Take 1,000 mg by mouth every 6 (six) hours as needed for moderate pain.    [provider]  alum & mag hydroxide-simeth (MAALOX MAX) 400-400-40 MG/5ML suspension Take 15 mLs by mouth every 6 (six) hours as needed for indigestion. 07/05/23   Lonell Grandchild, MD  amLODipine (NORVASC) 10 MG tablet Take 1 tablet (10 mg total) by mouth in the morning. 10/27/22   Vassie Loll, MD  B Complex-C-Folic Acid (DIALYVITE 800) 0.8 MG TABS Take 1 tablet by mouth daily. 01/03/23   [provider]  Blood Pressure Monitoring (OMRON 3 SERIES BP MONITOR) DEVI Use as directed 2 times a day 01/09/22   Burnadette Pop, MD  carvedilol (COREG) 25 MG tablet Take 1 tablet (25 mg total) by mouth 2 (two) times daily with a meal. 03/05/22   Tat, Onalee Hua, MD  cloNIDine (CATAPRES) 0.3 MG tablet Take 1 tablet (0.3 mg total) by mouth 3 (three) times  daily. 10/27/22   Vassie Loll, MD  Doxercalciferol (HECTOROL IV) Doxercalciferol (Hectorol) 03/19/23   [provider]  guaiFENesin (MUCINEX PO) Take 2 tablets by mouth 2 (two) times daily as needed (congestion).    [provider]  hydrALAZINE (APRESOLINE) 100 MG tablet Take 1 tablet (100 mg total) by mouth 3 (three) times daily. 03/05/22   Catarina Hartshorn, MD  ipratropium (ATROVENT HFA) 17 MCG/ACT inhaler Inhale 2 puffs into the lungs every 6 (six) hours as needed for wheezing. Patient taking differently: Inhale 2 puffs into the lungs 2 (two) times daily as needed for wheezing (Shortness of breath). 12/17/21 03/23/23  Rai, Delene Ruffini, MD  losartan (COZAAR) 50 MG tablet Take 50 mg by mouth at bedtime. 01/27/23   [provider]  Methoxy PEG-Epoetin Beta (MIRCERA IJ) Mircera 03/10/23 03/08/24  [provider]  ondansetron (ZOFRAN) 4 MG tablet Take 1 tablet (4 mg total) by mouth every 8 (eight) hours as needed for nausea or vomiting. 07/05/23   Lonell Grandchild, MD  pantoprazole (PROTONIX) 40 MG tablet Take 1 tablet (40 mg total) by mouth daily before breakfast. 07/05/23 09/03/23  Lonell Grandchild, MD  prochlorperazine (COMPAZINE) 10 MG tablet Take 1 tablet (10 mg total) by mouth 2 (two) times daily as needed for nausea or vomiting. 06/12/23   Rondel Baton, MD  sevelamer carbonate (RENVELA) 800 MG tablet Take 800 mg by mouth 3 (three) times daily with meals. Patient not taking: Reported on 03/23/2023 08/19/22   [provider]  traMADol (ULTRAM) 50 MG tablet Take 1 tablet (50 mg total) by mouth every 6 (six) hours as needed. 03/24/23   Larina Earthly, MD  VELPHORO 500 MG chewable tablet Chew 1,000 mg by mouth 3 (three) times daily.    [provider]  fenofibrate 160 MG tablet Take 1 tablet (160 mg total) by mouth daily. 12/31/20 01/20/21  Alba Cory, MD                                                                                                                                     Past Surgical History Past Surgical History:  Procedure Laterality Date   AV FISTULA PLACEMENT Left 06/10/2022   Procedure: LEFT ARM ARTERIOVENOUS (AV) FISTULA CREATION;  Surgeon: Larina Earthly, MD;  Location: [facility];  Service: Vascular;  Laterality: Left;   BASCILIC VEIN TRANSPOSITION Left 03/24/2023   Procedure: LEFT ARM SECOND STAGE BASILIC VEIN TRANSPOSITION;  Surgeon: Larina Earthly, MD;  Location: [facility];  Service:  Vascular;  Laterality: Left;   IR FLUORO GUIDE CV LINE RIGHT  11/30/2020   IR FLUORO GUIDE CV LINE RIGHT  06/13/2021   IR FLUORO GUIDE CV LINE RIGHT  02/01/2022   IR REMOVAL TUN CV CATH W/O FL  06/11/2021   IR REMOVAL TUN CV CATH W/O FL  01/29/2022   IR US GUIDE VASC ACCESS RIGHT  11/30/2020   IR US GUIDE VASC ACCESS RIGHT  06/13/2021   IR US GUIDE VASC ACCESS RIGHT  02/03/2022   RENAL BIOPSY     TEE WITHOUT CARDIOVERSION N/A 06/13/2021   Procedure: TRANSESOPHAGEAL ECHOCARDIOGRAM (TEE);  Surgeon: Wendall Stade, MD;  Location: [facility] ENDOSCOPY;  Service: Cardiovascular;  Laterality: N/A;   TEE WITHOUT CARDIOVERSION N/A 02/03/2022   Procedure: TRANSESOPHAGEAL ECHOCARDIOGRAM (TEE);  Surgeon: Thomasene Ripple, DO;  Location: [facility] ENDOSCOPY;  Service: Cardiovascular;  Laterality: N/A;   TUBAL LIGATION N/A 02/03/2019   Procedure: POST PARTUM TUBAL LIGATION;  Surgeon: [employer] Bing, MD;  Location: [facility] LD ORS;  Service: Gynecology;  Laterality: N/A;   Family History Family History  Problem Relation Age of Onset   Diabetes Maternal Grandmother    Hypertension Maternal Grandmother    Diabetes Maternal Grandfather    Hypertension Maternal Grandfather    Diabetes Paternal Grandmother    Hypertension Paternal Grandmother    Diabetes Paternal Grandfather    Hypertension Paternal Grandfather     Social History Social History   Tobacco Use   Smoking status: Some Days    Current packs/day: 0.25    Types: Cigarettes   Smokeless tobacco: Never   Tobacco comments:     1-2 cigarettes daily   Vaping Use   Vaping status: Never Used  Substance Use Topics   Alcohol use: Not Currently   Drug use: Yes    Types: Marijuana    Comment: 3 times a week    Allergies Oxycontin [oxycodone] and Dialyvite 800 [nephro-vite]  Review of Systems Review of Systems  Respiratory:  Positive for cough and shortness of breath.   Gastrointestinal:  Positive for abdominal pain.  Musculoskeletal:  Positive for arthralgias and myalgias.    Physical Exam Vital Signs  I have reviewed the triage vital signs BP (!) 89/65   Pulse 68   Temp 97.9 F (36.6 C)   Resp 20   Ht 5\' 7"  (1.702 m)   Wt 60.8 kg   LMP  (LMP Unknown) Comment: Per pt, periods stopped in 2020.  SpO2 99%   BMI 20.99 kg/m   Physical Exam Vitals and nursing note reviewed.  Constitutional:      General: She is not in acute distress.    Appearance: She is well-developed.  HENT:     Head: Normocephalic and atraumatic.  Eyes:     Conjunctiva/sclera: Conjunctivae normal.  Cardiovascular:     Rate and Rhythm: Normal rate and regular rhythm.     Heart sounds: No murmur heard. Pulmonary:     Effort: Pulmonary effort is normal. No respiratory distress.     Breath sounds: Rales present.  Abdominal:     Palpations: Abdomen is soft.     Tenderness: There is generalized abdominal tenderness.  Musculoskeletal:        General: No swelling.     Cervical back: Neck supple.  Skin:    General: Skin is warm and dry.     Capillary Refill: Capillary refill takes less than 2 seconds.  Neurological:     Mental Status: She is alert.  Psychiatric:  Mood and Affect: Mood normal.     ED Results and Treatments Labs (all labs ordered are listed, but only abnormal results are displayed) Labs Reviewed  RESP PANEL BY RT-PCR (RSV, FLU A&B, COVID)  RVPGX2  LIPASE, BLOOD  COMPREHENSIVE METABOLIC PANEL  CBC  URINALYSIS, ROUTINE W REFLEX MICROSCOPIC  POC URINE PREG, ED  TROPONIN I (HIGH SENSITIVITY)                                                                                                                           Radiology No results found.  Pertinent labs & imaging results that were available during my care of the patient were reviewed by me and considered in my medical decision making (see MDM for details).  Medications Ordered in ED Medications  lactated ringers bolus 500 mL (has no administration in time range)                                                                                                                                     Procedures .Critical Care  Performed by: Glendora Score, MD Authorized by: Glendora Score, MD   Critical care provider statement:    Critical care time (minutes):  30   Critical care was necessary to treat or prevent imminent or life-threatening deterioration of the following conditions:  Respiratory failure   Critical care was time spent personally by me on the following activities:  Development of treatment plan with patient or surrogate, discussions with consultants, evaluation of patient's response to treatment, examination of patient, ordering and review of laboratory studies, ordering and review of radiographic studies, ordering and performing treatments and interventions, pulse oximetry, re-evaluation of patient's condition and review of old charts   (including critical care time)  Medical Decision Making / ED Course   This patient presents to the ED for concern of chest pain, abdominal pain, myalgias, this involves an extensive number of treatment options, and is a complaint that carries with it a high risk of complications and morbidity.  The differential diagnosis includes fluid overload, uremia, gastritis, intra-abdominal infection, pneumonia  MDM: Patient seen the emergency room for evaluation of chest pain, abdominal pain and shortness of breath.  Patient hypoxic into the mid 80s and was placed on 2 L nasal cannula.   Generalized tenderness to palpation on abdominal exam and rales on lung exam.  Laboratory evaluation with a potassium  of 5.5, BUN 81, creatinine 16.71, hemoglobin 10.8, chest x-ray with pulmonary edema and increasing right pleural effusion.  COVID flu and RSV negative and obtained in the setting of hypoxia.  Patient pain controlled and I spoke with Dr. Thedore Mins of nephrology who helped arrange hospital dialysis for the patient today.  Patient was transferred back to the emergency department and her symptoms have completely resolved.  She had extensive CT imaging on 07/05/2023 and I do not feel that repeat imaging would change my management at this time.  Given that patient symptoms have completely resolved after dialysis, suspect patient's symptoms primarily secondary to fluid overload and uremia.  At this time she does not meet inpatient criteria and she is safe for discharge with outpatient follow-up.  Patient then discharged.  Patient saturating 94 % on room air after returning from dialysis.  Of note, patient hypertensive on arrival back to Emergency Department.  She has not taken her home blood pressure medications today and she was instructed to take these her home blood pressure regimen when she returns home.    Additional history obtained:  -External records from outside source obtained and reviewed including: Chart review including previous notes, labs, imaging, consultation notes   Lab Tests: -I ordered, reviewed, and interpreted labs.   The pertinent results include:   Labs Reviewed  RESP PANEL BY RT-PCR (RSV, FLU A&B, COVID)  RVPGX2  LIPASE, BLOOD  COMPREHENSIVE METABOLIC PANEL  CBC  URINALYSIS, ROUTINE W REFLEX MICROSCOPIC  POC URINE PREG, ED  TROPONIN I (HIGH SENSITIVITY)      EKG   EKG Interpretation Date/Time:  Wednesday July 08 2023 07:47:35 EDT Ventricular Rate:  60 PR Interval:  171 QRS Duration:  94 QT Interval:  503 QTC Calculation: 503 R Axis:   67  Text  Interpretation: Sinus rhythm Left atrial enlargement LVH with secondary repolarization abnormality Borderline prolonged QT interval No significant change since last tracing Confirmed by ,  ([id number]) on 07/08/2023 8:27:44 AM         Imaging Studies ordered: I ordered imaging studies including chest x-ray I independently visualized and interpreted imaging. I agree with the radiologist interpretation   Medicines ordered and prescription drug management: Meds ordered this encounter  Medications   lactated ringers bolus 500 mL    -I have reviewed the patients home medicines and have made adjustments as needed  Critical interventions Supplemental oxygen, emergent dialysis  Consultations Obtained: I requested consultation with the nephrologist Dr. Thedore Mins,  and discussed lab and imaging findings as well as pertinent plan - they recommend: Dialysis in the hospital today   Cardiac Monitoring: The patient was maintained on a cardiac monitor.  I personally viewed and interpreted the cardiac monitored which showed an underlying rhythm of: NSR  Social Determinants of Health:  Factors impacting patients care include: Missed multiple dialysis appointments   Reevaluation: After the interventions noted above, I reevaluated the patient and found that they have :improved  Co morbidities that complicate the patient evaluation  Past Medical History:  Diagnosis Date   Anasarca associated with disorder of kidney 06/08/2021   Anxiety    Asthma    Bipolar depression (HCC)    Depression    Dyspnea    ESRD (end stage renal disease) on dialysis (HCC) 10/2020   GERD (gastroesophageal reflux disease)    Gonorrhea    Lupus (HCC)    Pelvic inflammatory disease (PID)    Pleural effusion 06/08/2021   Pulmonary embolism (HCC) 01/31/2022   Renal hypertension  Trichomonas infection       Dispostion: I considered admission for this patient, but at this time with symptoms improved she  does not meet inpatient criteria for admission and she is safe for discharge with outpatient follow-up.  Return precautions given of which she voiced understanding and patient will go home and take her home blood pressure medicines.  Patient then discharge     Final Clinical Impression(s) / ED Diagnoses Final diagnoses:  None     @PCDICTATION @    Glendora Score, MD 07/08/23 2054

## 2023-07-08 NOTE — Progress Notes (Signed)
   HEMODIALYSIS TREATMENT NOTE:  Pt refused cannulation of AVF today. 3.25 hour heparin-free treatment completed.  Session was stopped with 15 minutes remaining d/t clotting cartridge.  All blood was returned.  Dr. Thedore Mins was notified.  Post-treatment:  07/08/23 1500  Vitals  Temp 98 F (36.7 C)  Temp Source Oral  BP (!) 196/107  MAP (mmHg) 133  BP Location Right Wrist  BP Method Automatic  Patient Position (if appropriate) Sitting  Pulse Rate 72  Pulse Rate Source Monitor  ECG Heart Rate 70  Resp 18  Post Treatment  Dialyzer Clearance Lightly streaked  Duration of HD Treatment -hour(s) 3.25 hour(s)  Hemodialysis Intake (mL) 0 mL  Liters Processed 66.3  Fluid Removed (mL) 3600 mL  Tolerated HD Treatment Yes  Post-Hemodialysis Comments Tx ended with 76m RTD d/t clotting bloodied transducer prompting cartridge change. Dr. Thedore Mins was notified.  Fistula / Graft Left Upper arm Arteriovenous fistula  Placement Date: (c)    Placed prior to admission: Yes  Orientation: Left  Access Location: Upper arm  Access Type: Arteriovenous fistula  Fistula / Graft Assessment Thrill;Bruit  Status Patent  Hemodialysis Catheter Right Internal jugular  Placement Date/Time: 02/01/22 1234   Placed prior to admission: No  Time Out: Correct patient;Correct site;Correct procedure  Maximum sterile barrier precautions: Hand hygiene;Cap;Mask;Sterile gown;Sterile gloves;Large sterile sheet  Site Prep: Chlorh...  Site Condition No complications  Blue Lumen Status Flushed;Heparin locked;Dead end cap in place  Red Lumen Status Flushed;Heparin locked;Dead end cap in place  Purple Lumen Status N/A  Catheter fill solution Heparin 1000 units/ml  Catheter fill volume (Arterial) 1.6 cc  Catheter fill volume (Venous) 1.6  Dressing Type Transparent;Tube stabilization device  Dressing Status Antimicrobial disc in place;Clean, Dry, Intact  Interventions New dressing  Drainage Description None  Dressing Change Due  07/15/23  Post treatment catheter status Capped and Clamped    No c/o pain, hopeful to discharge home.  Pt transported back to ED to await disposition.   Arman Filter, RN [facility]

## 2023-07-08 NOTE — ED Triage Notes (Signed)
Pt has been having abd pain 10/10 generalized abd pain since Friday. This caused her to miss dialysis sat and tues.  PT is now having abd pain, headache, nausea, diarrhea, and back pain.

## 2023-07-08 NOTE — ED Notes (Signed)
Pt transported to dialysis

## 2023-07-08 NOTE — Consult Note (Signed)
ESRD Consult Note  Reason for consult: ESRD, provision of dialysis  Assessment/Recommendations:  ESRD -outpatient HD orders: [facility].  TTS.  4 hours.  EDW 58.8 kg.  Flow rate: 400/800.  2K/2 calcium.  aVF.Marland Kitchen  Meds: Mircera 100 mcg every 2 weeks (last dose 7/20), Venofer 100 mg 100 mg 3 times a week (started 7/23, last dose 8/1), Hectorol 7 mcg every treatment -numerous missed treatments (chronic issue) -HD planned for today, can keep on TTS schedule starting tomorrow  Abdominal pain, nausea/diarrhea -work up per primary service  Volume/ hypertension  -will UF as tolerated  Hyperkalemia -HD today  Anemia of Chronic Kidney Disease -Hemoglobin 10.8. r/s ESA if/when hgb <10 -Transfuse PRN for Hgb <7.   Secondary Hyperparathyroidism/Hyperphosphatemia - resume home velphoro 2 tabs tidac -will resume hectorol   Vascular access - was planned to have a f'gram of AVF on 8/9 given bleeding. Will see how she access does here and if any issues, can plan for fistulogram while admitted  -needs to have [facility] removed, can be done as outpatient or inpatient depending on dispo/length of stay  Recommendations were discussed with the primary team.  Anthony Sar, MD [facility]  History of Present Illness: Cathy [name] is a/an 42 y.o. female with a past medical history of ESRD, hypertension, CHF, SLE, history of PE, NASH who presents with abdominal pain.  Has had numerous missed HD treatments, chronically noncompliant with dialysis.  Blood pressure 193/106 here with a potassium of 5.5 on presentation. Patient seen and examined in ER, she reports that her abdominal pain has been ongoing. Has also been having SOB, myalgias, chest pain. She had a similar presentation in the ER on 8/4-dissection study negative. Did not do dialysis yesterday given that she had a court date. She reports that she still has a TDC in place that needs to be removed.   Medications:  Current  Facility-Administered Medications  Medication Dose Route Frequency Provider Last Rate Last Admin   famotidine (PEPCID) IVPB 20 mg premix  20 mg Intravenous Once Kommor, [place], MD 100 mL/hr at 07/08/23 0759 20 mg at 07/08/23 0759   Current Outpatient Medications  Medication Sig Dispense Refill   acetaminophen (TYLENOL) 500 MG tablet Take 1,000 mg by mouth every 6 (six) hours as needed for moderate pain.     alum & mag hydroxide-simeth (MAALOX MAX) 400-400-40 MG/5ML suspension Take 15 mLs by mouth every 6 (six) hours as needed for indigestion. 355 mL 0   amLODipine (NORVASC) 10 MG tablet Take 1 tablet (10 mg total) by mouth in the morning. 30 tablet 2   B Complex-C-Folic Acid (DIALYVITE 800) 0.8 MG TABS Take 1 tablet by mouth daily.     Blood Pressure Monitoring (OMRON 3 SERIES BP MONITOR) DEVI Use as directed 2 times a day 1 each 0   carvedilol (COREG) 25 MG tablet Take 1 tablet (25 mg total) by mouth 2 (two) times daily with a meal. 60 tablet 1   cloNIDine (CATAPRES) 0.3 MG tablet Take 1 tablet (0.3 mg total) by mouth 3 (three) times daily. 90 tablet 2   Doxercalciferol (HECTOROL IV) Doxercalciferol (Hectorol)     guaiFENesin (MUCINEX PO) Take 2 tablets by mouth 2 (two) times daily as needed (congestion).     hydrALAZINE (APRESOLINE) 100 MG tablet Take 1 tablet (100 mg total) by mouth 3 (three) times daily. 90 tablet 1   ipratropium (ATROVENT HFA) 17 MCG/ACT inhaler Inhale 2 puffs into the lungs every 6 (six) hours  as needed for wheezing. (Patient taking differently: Inhale 2 puffs into the lungs 2 (two) times daily as needed for wheezing (Shortness of breath).) 1 each 12   losartan (COZAAR) 50 MG tablet Take 50 mg by mouth at bedtime.     Methoxy PEG-Epoetin Beta (MIRCERA IJ) Mircera     ondansetron (ZOFRAN) 4 MG tablet Take 1 tablet (4 mg total) by mouth every 8 (eight) hours as needed for nausea or vomiting. 12 tablet 0   pantoprazole (PROTONIX) 40 MG tablet Take 1 tablet (40 mg total) by  mouth daily before breakfast. 30 tablet 1   prochlorperazine (COMPAZINE) 10 MG tablet Take 1 tablet (10 mg total) by mouth 2 (two) times daily as needed for nausea or vomiting. 10 tablet 0   sevelamer carbonate (RENVELA) 800 MG tablet Take 800 mg by mouth 3 (three) times daily with meals. (Patient not taking: Reported on 03/23/2023)     traMADol (ULTRAM) 50 MG tablet Take 1 tablet (50 mg total) by mouth every 6 (six) hours as needed. 15 tablet 0   VELPHORO 500 MG chewable tablet Chew 1,000 mg by mouth 3 (three) times daily.       ALLERGIES Oxycontin [oxycodone] and Dialyvite 800 [nephro-vite]  MEDICAL HISTORY Past Medical History:  Diagnosis Date   Anasarca associated with disorder of kidney 06/08/2021   Anxiety    Asthma    Bipolar depression (HCC)    Depression    Dyspnea    ESRD (end stage renal disease) on dialysis (HCC) 10/2020   GERD (gastroesophageal reflux disease)    Gonorrhea    Lupus (HCC)    Pelvic inflammatory disease (PID)    Pleural effusion 06/08/2021   Pulmonary embolism (HCC) 01/31/2022   Renal hypertension    Trichomonas infection      SOCIAL HISTORY Social History   Socioeconomic History   Marital status: Single    Spouse name: Not on file   Number of children: Not on file   Years of education: Not on file   Highest education level: Not on file  Occupational History   Occupation: unemployed  Tobacco Use   Smoking status: Some Days    Current packs/day: 0.25    Types: Cigarettes   Smokeless tobacco: Never   Tobacco comments:    1-2 cigarettes daily   Vaping Use   Vaping status: Never Used  Substance and Sexual Activity   Alcohol use: Not Currently   Drug use: Yes    Types: Marijuana    Comment: 3 times a week    Sexual activity: Not Currently    Birth control/protection: None  Other Topics Concern   Not on file  Social History Narrative   Not on file   Social Determinants of Health   Financial Resource Strain: Not on file  Food  Insecurity: No Food Insecurity (10/26/2022)   Hunger Vital Sign    Worried About Running Out of Food in the Last Year: Never true    Ran Out of Food in the Last Year: Never true  Recent Concern: Food Insecurity - Food Insecurity Present (08/09/2022)   Hunger Vital Sign    Worried About Running Out of Food in the Last Year: Never true    Ran Out of Food in the Last Year: Often true  Transportation Needs: Unmet Transportation Needs (10/26/2022)   PRAPARE - Administrator, Civil Service (Medical): Yes    Lack of Transportation (Non-Medical): Yes  Physical Activity: Not on file  Stress: Not on file  Social Connections: Not on file  Intimate Partner Violence: Not At Risk (10/26/2022)   Humiliation, Afraid, Rape, and Kick questionnaire    Fear of Current or Ex-Partner: No    Emotionally Abused: No    Physically Abused: No    Sexually Abused: No     FAMILY HISTORY Family History  Problem Relation Age of Onset   Diabetes Maternal Grandmother    Hypertension Maternal Grandmother    Diabetes Maternal Grandfather    Hypertension Maternal Grandfather    Diabetes Paternal Grandmother    Hypertension Paternal Grandmother    Diabetes Paternal Grandfather    Hypertension Paternal Grandfather      Review of Systems: 12 systems were reviewed and negative except per HPI  Physical Exam: Vitals:   07/08/23 0652 07/08/23 0800  BP: (!) 89/65 (!) 193/106  Pulse: 68 63  Resp: 20 10  Temp: 97.9 F (36.6 C) 97.8 F (36.6 C)  SpO2: 99% 91%   No intake/output data recorded. No intake or output data in the 24 hours ending 07/08/23 0814 General: well-appearing, no acute distress HEENT: anicteric sclera, MMM CV: normal rate, no murmurs, no edema Lungs: bilateral chest rise, normal wob Abd: soft, non-tender, non-distended Skin: no visible lesions or rashes Psych: alert, engaged, appropriate mood and affect Neuro: normal speech, no gross focal deficits  Dialysis access: LUE AVF  +b/t, [facility]  Test Results Reviewed Lab Results  Component Value Date   NA 134 (L) 07/08/2023   K 5.5 (H) 07/08/2023   CL 94 (L) 07/08/2023   CO2 24 07/08/2023   BUN 81 (H) 07/08/2023   CREATININE 16.71 (H) 07/08/2023   CALCIUM 8.0 (L) 07/08/2023   ALBUMIN 3.4 (L) 07/08/2023   PHOS 6.1 (H) 06/07/2022    I have reviewed relevant outside healthcare records

## 2023-07-28 ENCOUNTER — Encounter: Payer: Self-pay | Admitting: Hematology and Oncology

## 2023-08-04 IMAGING — DX DG CHEST 1V PORT
1 series · 1 of 1 positions shown · non-contrast
Comparison: Chest CT with contrast yesterday at [DATE] p.m., portable
chest yesterday at [DATE] p.m.

CLINICAL DATA: Chest tube placement, recheck right pneumothorax.

EXAM:
PORTABLE CHEST 1 VIEW

[chest]
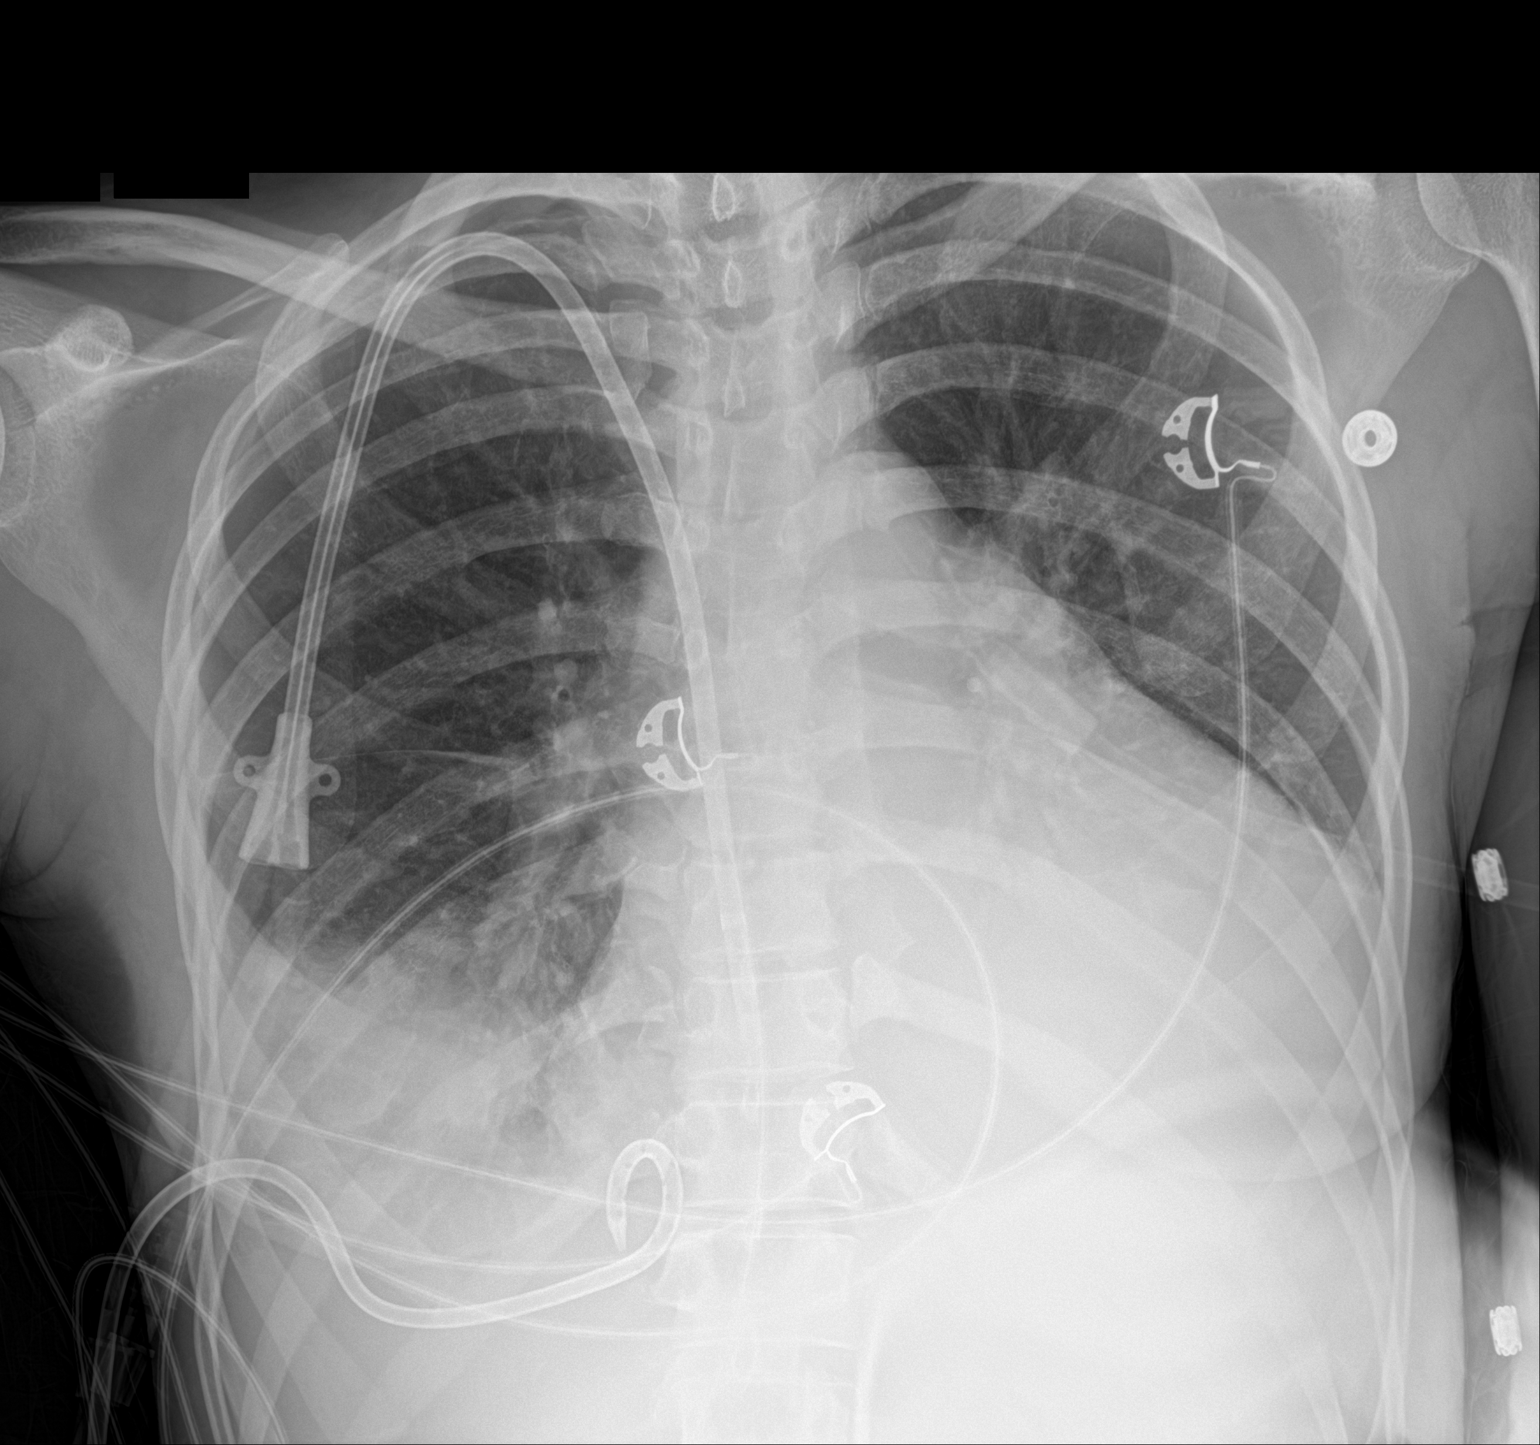

[1 of 1 positions shown; findings below may reference images not displayed]

FINDINGS: Pigtail right pleural drain again has its tip in the medial base of
the right chest. Minimal right apical pneumothorax estimated 3% or
less continues to be noted and is unchanged. Right basilar airspace
disease and at least a small pleural effusion continue to be noted
with dense left lower lobe consolidation still present and a small
left effusion.

The more cephalad lung fields are clear. There is a right IJ
double-lumen catheter with the tip in the right atrium. There is
cardiomegaly with normal caliber central vessels and stable
mediastinum.
IMPRESSION: There are no changes in the overall aeration from yesterday's most
recent film. Minimal 3% or less right apical pneumothorax persists
as well as right basilar airspace disease and small right effusion.
Aeration on left is likewise unchanged with denser lower lobe
consolidation and small effusion on this side as well. Chest tube
positioning is unaltered.

## 2023-08-05 IMAGING — DX DG CHEST 1V PORT
1 series · 1 of 1 positions shown · non-contrast
Comparison: Previous studies including the examination of
12/14/2021

CLINICAL DATA: Pleural effusion, difficulty breathing

EXAM:
PORTABLE CHEST 1 VIEW

[chest ap]
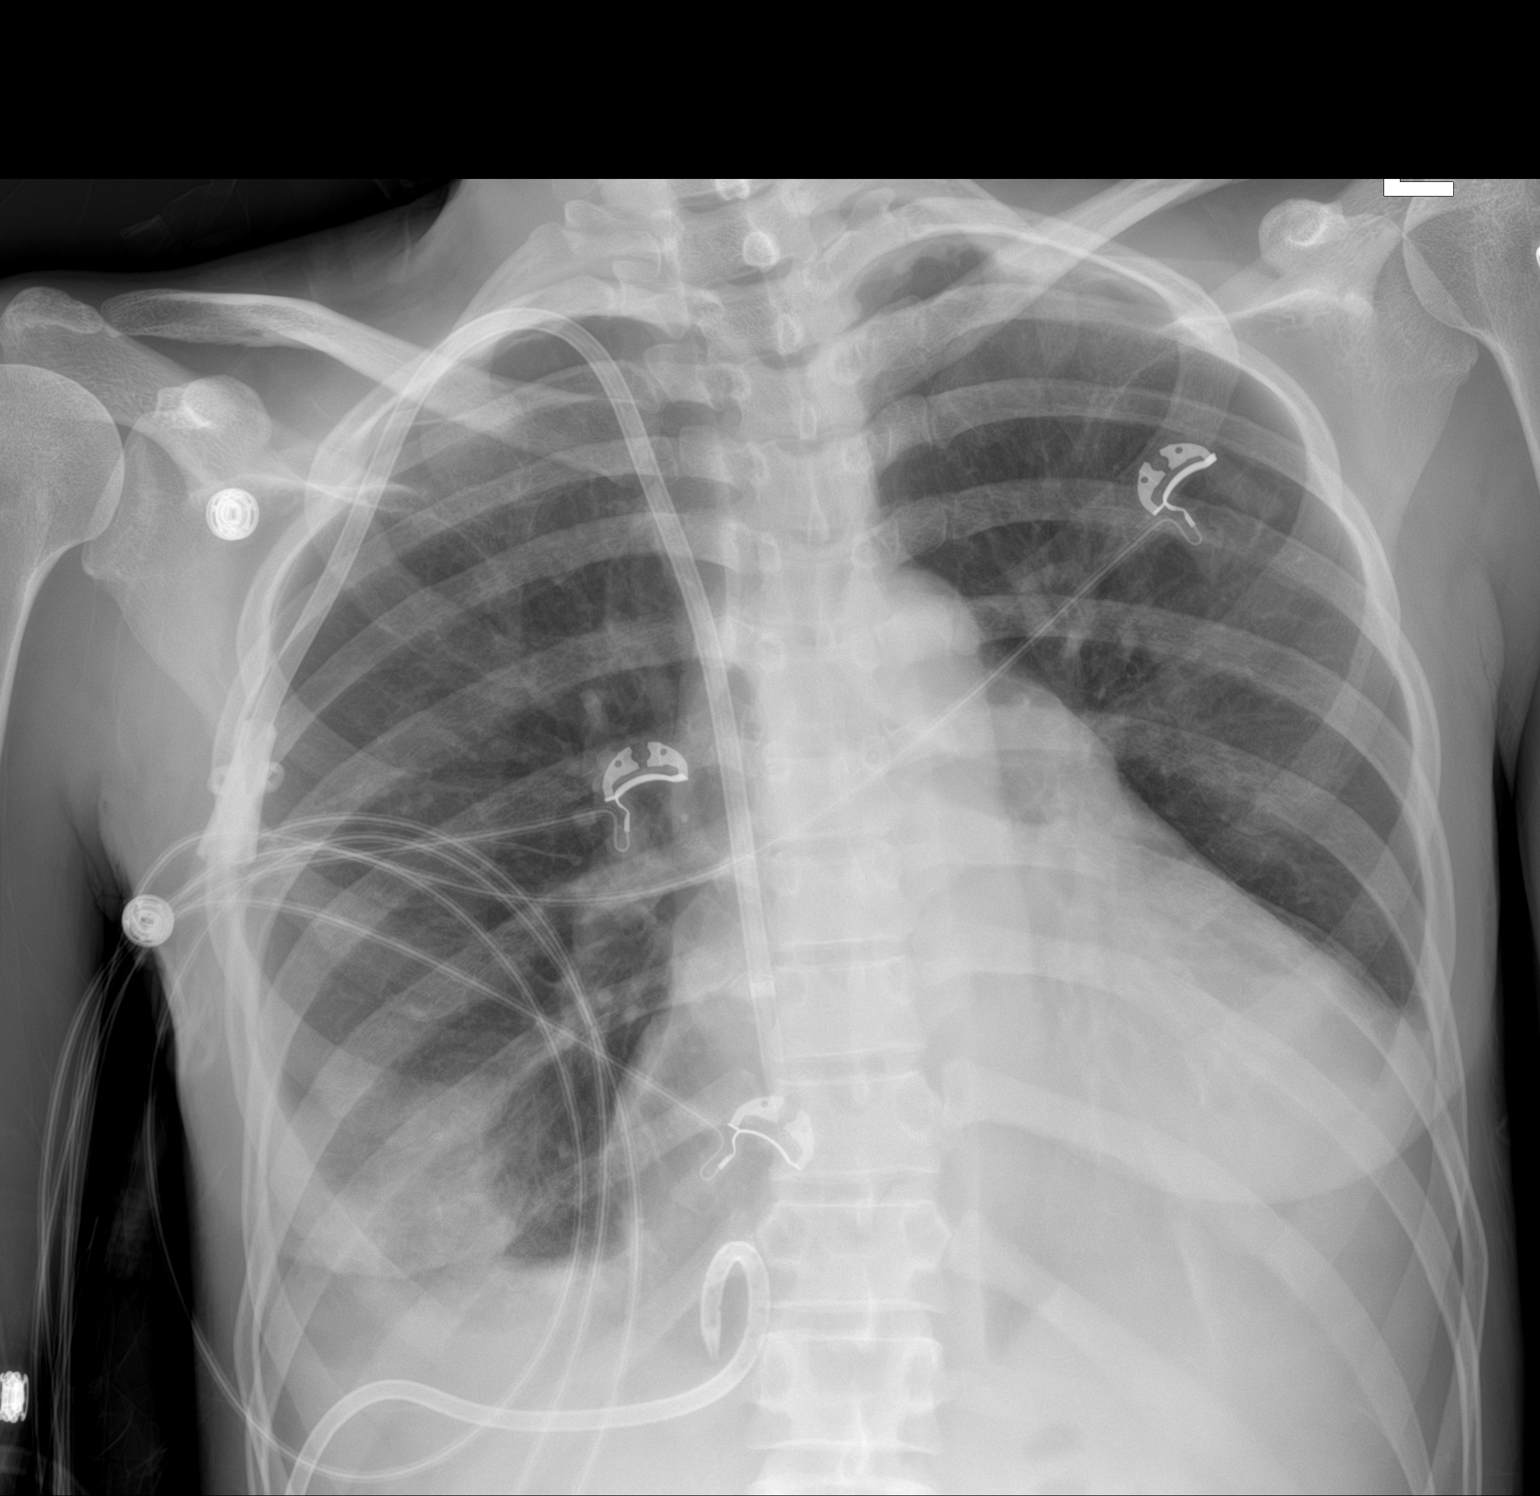

[1 of 1 positions shown; findings below may reference images not displayed]

FINDINGS: Transverse diameter of heart is increased. There are no signs of
pulmonary edema. Infiltrates are seen in both lower lung fields with
interval partial clearing. Right chest tube is noted with its tip in
the medial right lower lung fields. Tip of dialysis catheter is seen
in the region right atrium. There is no pneumothorax.
IMPRESSION: Cardiomegaly. There are no signs of pulmonary edema. There is
interval improvement in aeration of both lower lung fields
suggesting partial clearing of atelectasis/pneumonia and possibly
decrease in pleural effusions. There is no pneumothorax.

## 2023-08-26 ENCOUNTER — Encounter: Payer: Self-pay | Admitting: Hematology and Oncology

## 2023-08-27 ENCOUNTER — Ambulatory Visit (INDEPENDENT_AMBULATORY_CARE_PROVIDER_SITE_OTHER): Payer: Medicare HMO | Admitting: Family Medicine

## 2023-08-27 ENCOUNTER — Encounter: Payer: Self-pay | Admitting: Hematology and Oncology

## 2023-08-27 ENCOUNTER — Encounter: Payer: Self-pay | Admitting: Family Medicine

## 2023-08-27 VITALS — BP 170/98 | HR 63 | Resp 16 | Ht 67.0 in | Wt 129.0 lb

## 2023-08-27 DIAGNOSIS — R52 Pain, unspecified: Secondary | ICD-10-CM | POA: Diagnosis not present

## 2023-08-27 DIAGNOSIS — Z13220 Encounter for screening for lipoid disorders: Secondary | ICD-10-CM

## 2023-08-27 DIAGNOSIS — F339 Major depressive disorder, recurrent, unspecified: Secondary | ICD-10-CM | POA: Diagnosis not present

## 2023-08-27 DIAGNOSIS — D509 Iron deficiency anemia, unspecified: Secondary | ICD-10-CM

## 2023-08-27 DIAGNOSIS — Z131 Encounter for screening for diabetes mellitus: Secondary | ICD-10-CM

## 2023-08-27 DIAGNOSIS — Z8659 Personal history of other mental and behavioral disorders: Secondary | ICD-10-CM

## 2023-08-27 DIAGNOSIS — Z1329 Encounter for screening for other suspected endocrine disorder: Secondary | ICD-10-CM

## 2023-08-27 DIAGNOSIS — I10 Essential (primary) hypertension: Secondary | ICD-10-CM | POA: Insufficient documentation

## 2023-08-27 DIAGNOSIS — R0602 Shortness of breath: Secondary | ICD-10-CM

## 2023-08-27 DIAGNOSIS — M329 Systemic lupus erythematosus, unspecified: Secondary | ICD-10-CM

## 2023-08-27 DIAGNOSIS — R0600 Dyspnea, unspecified: Secondary | ICD-10-CM | POA: Insufficient documentation

## 2023-08-27 DIAGNOSIS — E559 Vitamin D deficiency, unspecified: Secondary | ICD-10-CM

## 2023-08-27 MED ORDER — CARVEDILOL 25 MG PO TABS: 25.0000 mg | ORAL_TABLET | Freq: Two times a day (BID) | ORAL | 1 refills | Status: DC

## 2023-08-27 MED ORDER — FLUTICASONE-SALMETEROL 100-50 MCG/ACT IN AEPB: 1.0000 | INHALATION_SPRAY | Freq: Two times a day (BID) | RESPIRATORY_TRACT | 3 refills | Status: DC

## 2023-08-27 MED ORDER — PANTOPRAZOLE SODIUM 40 MG PO TBEC: 40.0000 mg | DELAYED_RELEASE_TABLET | Freq: Every day | ORAL | 1 refills | Status: DC

## 2023-08-27 MED ORDER — FLUTICASONE-SALMETEROL 100-50 MCG/ACT IN AEPB: 1.0000 | INHALATION_SPRAY | Freq: Two times a day (BID) | RESPIRATORY_TRACT | 6 refills | Status: DC

## 2023-08-27 MED ORDER — BUSPIRONE HCL 7.5 MG PO TABS: 7.5000 mg | ORAL_TABLET | Freq: Two times a day (BID) | ORAL | 2 refills | Status: DC

## 2023-08-27 MED ORDER — AMLODIPINE BESYLATE 10 MG PO TABS: 10.0000 mg | ORAL_TABLET | Freq: Every morning | ORAL | 2 refills | Status: DC

## 2023-08-27 MED ORDER — ONDANSETRON HCL 4 MG PO TABS: 4.0000 mg | ORAL_TABLET | Freq: Three times a day (TID) | ORAL | 0 refills | Status: DC | PRN

## 2023-08-27 MED ORDER — DULOXETINE HCL 30 MG PO CPEP
30.0000 mg | ORAL_CAPSULE | Freq: Every day | ORAL | 3 refills | Status: DC
Start: 2023-08-27 — End: 2023-09-21

## 2023-08-27 MED ORDER — CLONIDINE HCL 0.3 MG PO TABS: 0.3000 mg | ORAL_TABLET | Freq: Three times a day (TID) | ORAL | 2 refills | Status: DC

## 2023-08-27 MED ORDER — HYDRALAZINE HCL 100 MG PO TABS: 100.0000 mg | ORAL_TABLET | Freq: Three times a day (TID) | ORAL | 1 refills | Status: DC

## 2023-08-27 MED ORDER — OMRON 3 SERIES BP MONITOR DEVI: 1.0000 | Freq: Two times a day (BID) | 0 refills | Status: DC

## 2023-08-27 NOTE — Patient Instructions (Signed)

## 2023-08-27 NOTE — Assessment & Plan Note (Signed)
Patient reports using Atrovent 17 mcg/Act inhaler  2 times daily with no improvement. Trial on Advair 100-50 mch inhaler Discussed the importance of smoking cessation  Referral placed to Pulmonary for PFT

## 2023-08-27 NOTE — Assessment & Plan Note (Addendum)
Vitals:   08/27/23 0847 08/27/23 0859  BP: (!) 167/94 (!) 170/98    Long history of poor compliance with taking blood pressure medications.  Referral placed to Cardiology Patient reports taking amlodipine 10 mg daily, carvedilol 25 mg twice daily, clonidine 0.3 mg 2 twice daily, Hydralazine 100 mg 3 times daily.  Explained non pharmacological interventions such as low salt, DASH diet discussed. Educated on stress reduction and physical activity minimum 150 minutes per week. Discussed signs and symptoms of major cardiovascular event and need to present to the ED.  Patient verbalizes understanding regarding plan of care and all questions answered.
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
## 2023-08-27 NOTE — Assessment & Plan Note (Signed)
Flowsheet Row Office Visit from 08/27/2023 in [facility]  PHQ-9 Total Score 11      Patient reports history of "bipolar disorder" and has not been on medication since 2016 Referral placed to psychiatry and behavioral health services Started patient on Buspar 7.5 mg twice daily Follow up in 5 weeks Discussed about cognitive behavioral therapy focusing on thoughts, belief, and attitudes that affects feelings and behavior, learning about coping skills to deal with certain problems. Maintaining a consistent routine and schedule, Practice stress management and self calming techniques, excersise regularly and spend time outdoors, Do not eat food that are high in fat, added sugar, or salt.  Patient verbally consented to [facility] services about presenting concerns and psychiatric consultation as appropriate. The services will be billed as appropriate for the patient.

## 2023-08-29 LAB — MICROALBUMIN / CREATININE URINE RATIO
Creatinine, Urine: 82.1 mg/dL
Microalb/Creat Ratio: 5320 mg/g{creat} — ABNORMAL HIGH (ref 0–29)
Microalbumin, Urine: 4367.6 ug/mL

## 2023-08-29 LAB — CBC WITH DIFFERENTIAL/PLATELET
Basophils Absolute: 0 10*3/uL (ref 0.0–0.2)
Basos: 1 %
EOS (ABSOLUTE): 0.2 10*3/uL (ref 0.0–0.4)
Eos: 5 %
Hematocrit: 35.1 % (ref 34.0–46.6)
Hemoglobin: 11 g/dL — ABNORMAL LOW (ref 11.1–15.9)
Immature Grans (Abs): 0 10*3/uL (ref 0.0–0.1)
Immature Granulocytes: 1 %
Lymphocytes Absolute: 1.1 10*3/uL (ref 0.7–3.1)
Lymphs: 25 %
MCH: 30.5 pg (ref 26.6–33.0)
MCHC: 31.3 g/dL — ABNORMAL LOW (ref 31.5–35.7)
MCV: 97 fL (ref 79–97)
Monocytes Absolute: 0.4 10*3/uL (ref 0.1–0.9)
Monocytes: 9 %
Neutrophils Absolute: 2.6 10*3/uL (ref 1.4–7.0)
Neutrophils: 59 %
Platelets: 217 10*3/uL (ref 150–450)
RBC: 3.61 x10E6/uL — ABNORMAL LOW (ref 3.77–5.28)
RDW: 15.2 % (ref 11.7–15.4)
WBC: 4.3 10*3/uL (ref 3.4–10.8)

## 2023-08-29 LAB — IRON,TIBC AND FERRITIN PANEL
Ferritin: 1500 ng/mL — ABNORMAL HIGH (ref 15–150)
Iron Saturation: 24 % (ref 15–55)
Iron: 47 ug/dL (ref 27–159)
Total Iron Binding Capacity: 199 ug/dL — ABNORMAL LOW (ref 250–450)
UIBC: 152 ug/dL (ref 131–425)

## 2023-08-29 LAB — ENA+DNA/DS+SJORGEN'S
ENA RNP Ab: 1.1 AI — ABNORMAL HIGH (ref 0.0–0.9)
ENA SM Ab Ser-aCnc: 0.9 AI (ref 0.0–0.9)
ENA SSA (RO) Ab: >8 AI — ABNORMAL HIGH (ref 0.0–0.9)
ENA SSB (LA) Ab: >8 AI — ABNORMAL HIGH (ref 0.0–0.9)
dsDNA Ab: 3 [IU]/mL (ref 0–9)

## 2023-08-29 LAB — B12 AND FOLATE PANEL
Folate: 5.6 ng/mL (ref >3.0)
Vitamin B-12: 322 pg/mL (ref 232–1245)

## 2023-08-29 LAB — LIPID PANEL
Chol/HDL Ratio: 4.9 {ratio} — ABNORMAL HIGH (ref 0.0–4.4)
Cholesterol, Total: 182 mg/dL (ref 100–199)
HDL: 37 mg/dL — ABNORMAL LOW (ref >39)
LDL Chol Calc (NIH): 113 mg/dL — ABNORMAL HIGH (ref 0–99)
Triglycerides: 182 mg/dL — ABNORMAL HIGH (ref 0–149)
VLDL Cholesterol Cal: 32 mg/dL (ref 5–40)

## 2023-08-29 LAB — HEMOGLOBIN A1C
Est. average glucose Bld gHb Est-mCnc: 94 mg/dL
Hgb A1c MFr Bld: 4.9 % (ref 4.8–5.6)

## 2023-08-29 LAB — VITAMIN D 25 HYDROXY (VIT D DEFICIENCY, FRACTURES): Vit D, 25-Hydroxy: 22.1 ng/mL — ABNORMAL LOW (ref 30.0–100.0)

## 2023-08-29 LAB — TSH+FREE T4
Free T4: 1.5 ng/dL (ref 0.82–1.77)
TSH: 1.22 u[IU]/mL (ref 0.450–4.500)

## 2023-08-29 LAB — ANA W/REFLEX

## 2023-08-30 ENCOUNTER — Other Ambulatory Visit: Payer: Self-pay | Admitting: Family Medicine

## 2023-08-30 DIAGNOSIS — R7989 Other specified abnormal findings of blood chemistry: Secondary | ICD-10-CM

## 2023-08-30 DIAGNOSIS — R768 Other specified abnormal immunological findings in serum: Secondary | ICD-10-CM

## 2023-08-30 NOTE — Progress Notes (Signed)
Please inform patient,  Vitamin D levels low, I advise to taking  over the counter supplements of vitamin D 1000 IU/day to prevent low vitamin D levels. Consuming Vitamin D rich food sources include fish, salmon, sardines, egg yolks, red meat, liver, oranges, soy milk.      Cholesterol levels elevated, start lifestyle modifications follow diet low in saturated fat, reduce dietary salt intake, avoid fatty foods, maintain an exercise routine 3 to 5 days a week for a minimum total of 150 minutes.

## 2023-08-30 NOTE — Progress Notes (Signed)
I encourage over the counter omega-3 fish oil 1000 mg twice daily.   Referral placed to rheumatology for management of lupus And Hematology for elevated ferritin

## 2023-09-15 ENCOUNTER — Encounter: Payer: Self-pay | Admitting: Hematology and Oncology

## 2023-09-16 ENCOUNTER — Other Ambulatory Visit: Payer: Self-pay

## 2023-09-16 ENCOUNTER — Emergency Department (HOSPITAL_COMMUNITY)
Admission: EM | Admit: 2023-09-16 | Discharge: 2023-09-16 | Disposition: A | Discharge status: Home / Self Care | Payer: Medicare HMO | Attending: Emergency Medicine | Admitting: Emergency Medicine

## 2023-09-16 ENCOUNTER — Encounter (HOSPITAL_COMMUNITY): Payer: Self-pay | Admitting: *Deleted

## 2023-09-16 ENCOUNTER — Inpatient Hospital Stay: Payer: Medicare HMO

## 2023-09-16 ENCOUNTER — Emergency Department (HOSPITAL_COMMUNITY): Payer: Medicare HMO

## 2023-09-16 ENCOUNTER — Inpatient Hospital Stay: Payer: Medicare HMO | Admitting: Oncology

## 2023-09-16 DIAGNOSIS — N186 End stage renal disease: Secondary | ICD-10-CM | POA: Diagnosis not present

## 2023-09-16 DIAGNOSIS — I10 Essential (primary) hypertension: Secondary | ICD-10-CM

## 2023-09-16 DIAGNOSIS — I120 Hypertensive chronic kidney disease with stage 5 chronic kidney disease or end stage renal disease: Secondary | ICD-10-CM | POA: Insufficient documentation

## 2023-09-16 DIAGNOSIS — Z91158 Patient's noncompliance with renal dialysis for other reason: Secondary | ICD-10-CM | POA: Insufficient documentation

## 2023-09-16 DIAGNOSIS — R1012 Left upper quadrant pain: Secondary | ICD-10-CM | POA: Diagnosis not present

## 2023-09-16 DIAGNOSIS — R079 Chest pain, unspecified: Secondary | ICD-10-CM | POA: Diagnosis present

## 2023-09-16 DIAGNOSIS — Z79899 Other long term (current) drug therapy: Secondary | ICD-10-CM | POA: Insufficient documentation

## 2023-09-16 LAB — I-STAT CHEM 8, ED
BUN: 34 mg/dL — ABNORMAL HIGH (ref 6–20)
Calcium, Ion: 1 mmol/L — ABNORMAL LOW (ref 1.15–1.40)
Chloride: 99 mmol/L (ref 98–111)
Creatinine, Ser: 9.5 mg/dL — ABNORMAL HIGH (ref 0.44–1.00)
Glucose, Bld: 90 mg/dL (ref 70–99)
HCT: 38 % (ref 36.0–46.0)
Hemoglobin: 12.9 g/dL (ref 12.0–15.0)
Potassium: 4.3 mmol/L (ref 3.5–5.1)
Sodium: 138 mmol/L (ref 135–145)
TCO2: 27 mmol/L (ref 22–32)

## 2023-09-16 LAB — TROPONIN I (HIGH SENSITIVITY)
Troponin I (High Sensitivity): 24 ng/L — ABNORMAL HIGH (ref <18)
Troponin I (High Sensitivity): 26 ng/L — ABNORMAL HIGH (ref <18)

## 2023-09-16 LAB — COMPREHENSIVE METABOLIC PANEL
ALT: 23 U/L (ref 0–44)
AST: 24 U/L (ref 15–41)
Albumin: 3 g/dL — ABNORMAL LOW (ref 3.5–5.0)
Alkaline Phosphatase: 161 U/L — ABNORMAL HIGH (ref 38–126)
Anion gap: 16 — ABNORMAL HIGH (ref 5–15)
BUN: 83 mg/dL — ABNORMAL HIGH (ref 6–20)
CO2: 23 mmol/L (ref 22–32)
Calcium: 8.1 mg/dL — ABNORMAL LOW (ref 8.9–10.3)
Chloride: 98 mmol/L (ref 98–111)
Creatinine, Ser: 15.74 mg/dL — ABNORMAL HIGH (ref 0.44–1.00)
GFR, Estimated: 3 mL/min — ABNORMAL LOW (ref >60)
Glucose, Bld: 92 mg/dL (ref 70–99)
Potassium: 6.1 mmol/L — ABNORMAL HIGH (ref 3.5–5.1)
Sodium: 137 mmol/L (ref 135–145)
Total Bilirubin: 0.9 mg/dL (ref 0.3–1.2)
Total Protein: 6.2 g/dL — ABNORMAL LOW (ref 6.5–8.1)

## 2023-09-16 LAB — LIPASE, BLOOD: Lipase: 32 U/L (ref 11–51)

## 2023-09-16 LAB — HEPATITIS B SURFACE ANTIGEN: Hepatitis B Surface Ag: NONREACTIVE

## 2023-09-16 LAB — CBC
HCT: 33.6 % — ABNORMAL LOW (ref 36.0–46.0)
Hemoglobin: 10.6 g/dL — ABNORMAL LOW (ref 12.0–15.0)
MCH: 30.2 pg (ref 26.0–34.0)
MCHC: 31.5 g/dL (ref 30.0–36.0)
MCV: 95.7 fL (ref 80.0–100.0)
Platelets: 132 10*3/uL — ABNORMAL LOW (ref 150–400)
RBC: 3.51 MIL/uL — ABNORMAL LOW (ref 3.87–5.11)
RDW: 17.2 % — ABNORMAL HIGH (ref 11.5–15.5)
WBC: 5.8 10*3/uL (ref 4.0–10.5)
nRBC: 0 % (ref 0.0–0.2)

## 2023-09-16 MED ORDER — FENTANYL CITRATE PF 50 MCG/ML IJ SOSY
50.0000 ug | PREFILLED_SYRINGE | Freq: Once | INTRAMUSCULAR | Status: AC
  Administered 2023-09-16: 50 ug via INTRAVENOUS
  Filled 2023-09-16: qty 1

## 2023-09-16 MED ORDER — CHLORHEXIDINE GLUCONATE CLOTH 2 % EX PADS: 6.0000 | MEDICATED_PAD | Freq: Every day | CUTANEOUS | Status: DC

## 2023-09-16 MED ORDER — ONDANSETRON HCL 4 MG/2ML IJ SOLN
4.0000 mg | Freq: Once | INTRAMUSCULAR | Status: AC
  Administered 2023-09-16: 4 mg via INTRAVENOUS
  Filled 2023-09-16: qty 2

## 2023-09-16 MED ORDER — LIDOCAINE HCL (PF) 1 % IJ SOLN: 5.0000 mL | INTRAMUSCULAR | Status: DC | PRN

## 2023-09-16 MED ORDER — DIPHENHYDRAMINE HCL 50 MG/ML IJ SOLN
25.0000 mg | Freq: Once | INTRAMUSCULAR | Status: AC
  Administered 2023-09-16: 25 mg via INTRAVENOUS

## 2023-09-16 MED ORDER — LOSARTAN POTASSIUM 25 MG PO TABS
50.0000 mg | ORAL_TABLET | Freq: Once | ORAL | Status: AC
  Administered 2023-09-16: 50 mg via ORAL
  Filled 2023-09-16: qty 2

## 2023-09-16 MED ORDER — HEPARIN SODIUM (PORCINE) 1000 UNIT/ML IJ SOLN
INTRAMUSCULAR | Status: AC
  Filled 2023-09-16: qty 3

## 2023-09-16 MED ORDER — FENTANYL CITRATE (PF) 100 MCG/2ML IJ SOLN: 100.0000 ug | Freq: Once | INTRAMUSCULAR | Status: DC

## 2023-09-16 MED ORDER — CLONIDINE HCL 0.2 MG PO TABS
0.2000 mg | ORAL_TABLET | Freq: Once | ORAL | Status: AC
  Administered 2023-09-16: 0.2 mg via ORAL
  Filled 2023-09-16: qty 1

## 2023-09-16 MED ORDER — DIPHENHYDRAMINE HCL 50 MG/ML IJ SOLN
INTRAMUSCULAR | Status: AC
  Filled 2023-09-16: qty 1

## 2023-09-16 MED ORDER — HEPARIN SODIUM (PORCINE) 1000 UNIT/ML DIALYSIS
20.0000 [IU]/kg | INTRAMUSCULAR | Status: DC | PRN
  Administered 2023-09-16: 1300 [IU] via INTRAVENOUS_CENTRAL

## 2023-09-16 MED ORDER — LIDOCAINE-PRILOCAINE 2.5-2.5 % EX CREA: 1.0000 | TOPICAL_CREAM | CUTANEOUS | Status: DC | PRN

## 2023-09-16 MED ORDER — PENTAFLUOROPROP-TETRAFLUOROETH EX AERO: 1.0000 | INHALATION_SPRAY | CUTANEOUS | Status: DC | PRN

## 2023-09-16 MED ORDER — HYDRALAZINE HCL 20 MG/ML IJ SOLN
20.0000 mg | Freq: Once | INTRAMUSCULAR | Status: AC
  Administered 2023-09-16: 20 mg via INTRAVENOUS

## 2023-09-16 MED ORDER — HYDRALAZINE HCL 20 MG/ML IJ SOLN
INTRAMUSCULAR | Status: AC
  Filled 2023-09-16: qty 1

## 2023-09-16 NOTE — ED Notes (Signed)
Pt transported to dialysis

## 2023-09-16 NOTE — ED Notes (Signed)
Pt aware we need urine sample.

## 2023-09-16 NOTE — ED Notes (Signed)
X-Ray at bedside.

## 2023-09-16 NOTE — Discharge Instructions (Signed)
Is important that you keep your scheduled dialysis appointments.  Take your blood pressure medications as directed.  Please follow-up with your primary care provider for recheck.  Return the emergency department for any new or worsening symptoms.

## 2023-09-16 NOTE — Progress Notes (Signed)
  EMERGENT HEMODIALYSIS TREATMENT NOTE:  3.5 hour low-heparin treatment completed using left upper arm AVF (15g/antegrade).  Goal challenge met: 4 liters removed without interruption in UF.  Persistent HTN with BPs 200s over 100s.  Hydralazine 20mg  given IVP during last 20 minutes of treatment with NO effect.  All blood was returned.  AV needles were removed, one at a time.  Clonidine 0.2mg  ordered po x 1 by on-call nephrologist.  Dr. Wallace Cullens, EDP was notified of patient's condition.  Post-treatment:  09/16/23 2045  Vitals  Temp 98.1 F (36.7 C)  Temp Source Oral  BP (!) 215/120  MAP (mmHg) 148  BP Location Right Arm  BP Method Automatic  Patient Position (if appropriate) Sitting  Pulse Rate 72  Pulse Rate Source Monitor  ECG Heart Rate 73  Resp 18  Oxygen Therapy  SpO2 100 %  O2 Device Room Air  Hepatitis B Pre Treatment Patient Checks  Hepatitis B Surface Antigen Results Pending (labs drawn, awaiting results)  Date Hepatitis B Surface Antigen Drawn 09/16/23  Hep B Antibody Quant/Post still pending  Date Hep B Antibody Quant/Post Drawn 09/16/23  Patient's Immunity Status Pending (labs drawn, awaiting results)  Isolation Initiated Unknown Hepatitis status (Tablo 2 - chemical disinfection)  Post Treatment  Dialyzer Clearance Clear  Hemodialysis Intake (mL) 0 mL  Liters Processed 66.4  Fluid Removed (mL) 4000 mL  Tolerated HD Treatment Yes  Post-Hemodialysis Comments Persistent HTN - see PN  AVG/AVF Arterial Site Held (minutes) 20 minutes  AVG/AVF Venous Site Held (minutes) 15 minutes  Fistula / Graft Left Upper arm Arteriovenous fistula  Placement Date: (c)    Placed prior to admission: Yes  Orientation: Left  Access Location: Upper arm  Access Type: Arteriovenous fistula  Fistula / Graft Assessment Thrill;Bruit  Status Patent    Report was called to Orlena Sheldon, ED charge nurse. Pt was transported by [facility] from [facility] back to ED to determine disposition.   Arman Filter,  RN [facility]

## 2023-09-16 NOTE — ED Provider Notes (Signed)
[place] EMERGENCY DEPARTMENT AT [facility] Provider Note   CSN: [id number] Arrival date & time: 09/16/23  8657     History  Chief Complaint  Patient presents with   Chest Pain    Cathy [name] is a 42 y.o. female with a history including end-stage renal disease who has missed her last 2 dialysis sessions last would have occurred yesterday, secondary to lack of transportation, other history including lupus and history of PE, not currently on anticoagulation presenting for evaluation of chest pain, abdominal pain, elevated blood pressure, abdominal distention and shortness of breath.  She states she has a symptoms when she misses dialysis.  She was told by the transportation service of [facility] that she has used her maximum services and has to reapply, hence having missed the last 2 sessions.  She denies cough, orthopnea, fevers or chills.  She has not taken any blood pressure medications today, stating she has also had some nausea and p.o. intake has been regurgitating, also describes having a little bit of diarrhea since yesterday.   The history is provided by the patient.       Home Medications Prior to Admission medications   Medication Sig Start Date End Date Taking? Authorizing Provider  acetaminophen (TYLENOL) 500 MG tablet Take 1,000 mg by mouth every 6 (six) hours as needed for moderate pain.    [provider]  alum & mag hydroxide-simeth (MAALOX MAX) 400-400-40 MG/5ML suspension Take 15 mLs by mouth every 6 (six) hours as needed for indigestion. 07/05/23   Lonell Grandchild, MD  amLODipine (NORVASC) 10 MG tablet Take 1 tablet (10 mg total) by mouth in the morning. 08/27/23   [name], Tenna Child, FNP  B Complex-C-Folic Acid (DIALYVITE 800) 0.8 MG TABS Take 1 tablet by mouth daily. 01/03/23   [provider]  Blood Pressure Monitoring (OMRON 3 SERIES BP MONITOR) DEVI Use as directed 2 times a day 08/27/23   [name], Tenna Child,  FNP  busPIRone (BUSPAR) 7.5 MG tablet Take 1 tablet (7.5 mg total) by mouth 2 (two) times daily. 08/27/23   Del Nigel Berthold, FNP  carvedilol (COREG) 25 MG tablet Take 1 tablet (25 mg total) by mouth 2 (two) times daily with a meal. 08/27/23   [name], Tenna Child, FNP  cloNIDine (CATAPRES) 0.3 MG tablet Take 1 tablet (0.3 mg total) by mouth 3 (three) times daily. 08/27/23   Del Nigel Berthold, FNP  Doxercalciferol (HECTOROL IV) Doxercalciferol (Hectorol) Patient not taking: Reported on 08/27/2023 03/19/23   [provider]  DULoxetine (CYMBALTA) 30 MG capsule Take 1 capsule (30 mg total) by mouth daily. 08/27/23   Del Nigel Berthold, FNP  fluticasone-salmeterol (ADVAIR) 100-50 MCG/ACT AEPB Inhale 1 puff into the lungs 2 (two) times daily. 08/27/23   Del Nigel Berthold, FNP  guaiFENesin (MUCINEX PO) Take 2 tablets by mouth 2 (two) times daily as needed (congestion).    [provider]  hydrALAZINE (APRESOLINE) 100 MG tablet Take 1 tablet (100 mg total) by mouth 3 (three) times daily. 08/27/23   Del Nigel Berthold, FNP  ipratropium (ATROVENT HFA) 17 MCG/ACT inhaler Inhale 2 puffs into the lungs every 6 (six) hours as needed for wheezing. Patient taking differently: Inhale 2 puffs into the lungs 2 (two) times daily as needed for wheezing (Shortness of breath). 12/17/21 03/23/23  Rai, Delene Ruffini, MD  losartan (COZAAR) 50 MG tablet Take 50 mg by mouth at bedtime. 01/27/23  [provider]  Methoxy PEG-Epoetin Beta (MIRCERA IJ) Mircera Patient not taking: Reported on 08/27/2023 03/10/23 03/08/24  [provider]  ondansetron (ZOFRAN) 4 MG tablet Take 1 tablet (4 mg total) by mouth every 8 (eight) hours as needed for nausea or vomiting. 08/27/23   [name], Tenna Child, FNP  pantoprazole (PROTONIX) 40 MG tablet Take 1 tablet (40 mg total) by mouth daily before breakfast. 08/27/23 10/26/23  Del Nigel Berthold, FNP  prochlorperazine (COMPAZINE)  10 MG tablet Take 1 tablet (10 mg total) by mouth 2 (two) times daily as needed for nausea or vomiting. Patient not taking: Reported on 08/27/2023 06/12/23   Rondel Baton, MD  VELPHORO 500 MG chewable tablet Chew 1,000 mg by mouth 3 (three) times daily.    [provider]  fenofibrate 160 MG tablet Take 1 tablet (160 mg total) by mouth daily. 12/31/20 01/20/21  Regalado, Prentiss Bells, MD      Allergies    Dialyvite 800 [nephro-vite]    Review of Systems   Review of Systems  Constitutional:  Negative for fever.  HENT:  Negative for congestion.   Eyes: Negative.   Respiratory:  Positive for shortness of breath. Negative for chest tightness.   Cardiovascular:  Positive for chest pain.  Gastrointestinal:  Positive for abdominal distention, abdominal pain, diarrhea and nausea.  Genitourinary: Negative.   Musculoskeletal:  Negative for arthralgias, joint swelling and neck pain.  Skin: Negative.  Negative for rash and wound.  Neurological:  Negative for dizziness, weakness, light-headedness, numbness and headaches.  Psychiatric/Behavioral: Negative.      Physical Exam Updated Vital Signs BP (!) 215/123   Pulse (!) 59   Temp 98 F (36.7 C) (Oral)   Resp 16   Ht 5\' 7"  (1.702 m)   Wt 63.2 kg   SpO2 96%   BMI 21.82 kg/m  Physical Exam Vitals and nursing note reviewed.  Constitutional:      Appearance: She is well-developed.  HENT:     Head: Normocephalic and atraumatic.  Eyes:     Conjunctiva/sclera: Conjunctivae normal.  Cardiovascular:     Rate and Rhythm: Normal rate and regular rhythm.     Heart sounds: Normal heart sounds.  Pulmonary:     Effort: Pulmonary effort is normal.     Breath sounds: Examination of the right-lower field reveals rales. Examination of the left-lower field reveals rales. Rales present. No wheezing.  Abdominal:     General: Bowel sounds are normal. There is distension.     Palpations: Abdomen is soft.     Tenderness: There is abdominal  tenderness in the left upper quadrant.  Musculoskeletal:        General: Normal range of motion.     Cervical back: Normal range of motion.  Skin:    General: Skin is warm and dry.  Neurological:     Mental Status: She is alert.     ED Results / Procedures / Treatments   Labs (all labs ordered are listed, but only abnormal results are displayed) Labs Reviewed  CBC - Abnormal; Notable for the following components:      Result Value   RBC 3.51 (*)    Hemoglobin 10.6 (*)    HCT 33.6 (*)    RDW 17.2 (*)    Platelets 132 (*)    All other components within normal limits  COMPREHENSIVE METABOLIC PANEL - Abnormal; Notable for the following components:   Potassium 6.1 (*)    BUN 83 (*)  Creatinine, Ser 15.74 (*)    Calcium 8.1 (*)    Total Protein 6.2 (*)    Albumin 3.0 (*)    Alkaline Phosphatase 161 (*)    GFR, Estimated 3 (*)    Anion gap 16 (*)    All other components within normal limits  TROPONIN I (HIGH SENSITIVITY) - Abnormal; Notable for the following components:   Troponin I (High Sensitivity) 24 (*)    All other components within normal limits  TROPONIN I (HIGH SENSITIVITY) - Abnormal; Notable for the following components:   Troponin I (High Sensitivity) 26 (*)    All other components within normal limits  LIPASE, BLOOD  HEPATITIS B SURFACE ANTIGEN  HEPATITIS B SURFACE ANTIBODY, QUANTITATIVE    EKG EKG Interpretation Date/Time:  Wednesday September 16 2023 09:50:40 EDT Ventricular Rate:  60 PR Interval:  154 QRS Duration:  68 QT Interval:  470 QTC Calculation: 470 R Axis:   17  Text Interpretation: Normal sinus rhythm Possible Left atrial enlargement Minimal voltage criteria for LVH, may be normal variant ( Sokolow-Lyon ) Anterior infarct , age undetermined Abnormal ECG When compared with ECG of 08-Jul-2023 07:47, PREVIOUS ECG IS PRESENT Confirmed by Bethann Berkshire [phone]) on 09/16/2023 10:53:38 AM  Radiology DG Chest Portable 1 View  Result Date:  09/16/2023 CLINICAL DATA:  Chest pain.  Shortness of breath. EXAM: PORTABLE CHEST 1 VIEW COMPARISON:  07/08/2023. FINDINGS: Redemonstration of blunting of right lateral costophrenic angle, essentially similar to the prior study. There are probable associated probable compressive atelectatic changes in the right lung. Bilateral lung fields are otherwise clear. Left lateral costophrenic angles is clear. Stable cardio-mediastinal silhouette. No acute osseous abnormalities. The soft tissues are within normal limits. Interval removal of previously seen right IJ hemodialysis catheter. IMPRESSION: *Redemonstration of right pleural effusion with associated probable compressive atelectatic changes. No significant interval change. Electronically Signed   By: Jules Schick M.D.   On: 09/16/2023 11:24    Procedures Procedures    Medications Ordered in ED Medications  Chlorhexidine Gluconate Cloth 2 % PADS 6 each (has no administration in time range)  pentafluoroprop-tetrafluoroeth (GEBAUERS) aerosol 1 Application (has no administration in time range)  lidocaine (PF) (XYLOCAINE) 1 % injection 5 mL (has no administration in time range)  lidocaine-prilocaine (EMLA) cream 1 Application (has no administration in time range)  heparin injection 1,300 Units (1,300 Units Dialysis Given 09/16/23 1636)  fentaNYL (SUBLIMAZE) injection 50 mcg (50 mcg Intravenous Given 09/16/23 1100)  ondansetron (ZOFRAN) injection 4 mg (4 mg Intravenous Given 09/16/23 1101)  fentaNYL (SUBLIMAZE) injection 50 mcg (50 mcg Intravenous Given 09/16/23 1223)  fentaNYL (SUBLIMAZE) injection 50 mcg (50 mcg Intravenous Given 09/16/23 1553)  diphenhydrAMINE (BENADRYL) injection 25 mg (25 mg Intravenous Given 09/16/23 1635)    ED Course/ Medical Decision Making/ A&P                                 Medical Decision Making Patient presenting with abdominal and chest pain, distention, reported similar symptoms with prior episodes of missed  dialysis.  Her last dialysis was last Thursday indicating she has missed 2 treatments, second missed treatment should have been yesterday.  Labs obtained as outlined below, discussed with Dr. Arrie Aran who has placed orders for dialysis today.  Will plan to reassess patient after her treatment and if symptoms are improved she may be discharged home.  Discussed with Pauline Aus, PA-C who assumes patient care.  Amount and/or Complexity of Data Reviewed Labs: ordered.    Details: Labs are significant for an elevated potassium at 6.1, she has a BUN of 83 and a creatinine of 15.74 she has an alk phos of 161, her hemoglobin is 10.6.  All of these abnormal lab values are essentially baseline for this patient with the exception of her potassium being elevated at this time. Radiology: ordered.    Details: Chest x-ray reviewed, right pleural effusion.  Unchanged Discussion of management or test interpretation with external provider(s): Discussed with Dr. Arrie Aran per above.  Also discussed patient's transportation problems with our case manager.  She reached out to the staff at her dialysis center and her Medicaid worker.  Patient had been's on a Medicaid plan which is a family plan allowing only a certain number of transportation services which she had maxed out.  If she is on an individual Medicaid plan she has unlimited transportation.  This is being changed for her so that she should not have any further difficulty getting to her dialysis treatments.  Risk Decision regarding hospitalization.           Final Clinical Impression(s) / ED Diagnoses Final diagnoses:  Left upper quadrant abdominal pain  Patient's noncompliance with renal dialysis for other reason [facility])  Hypertension, unspecified type    Rx / DC Orders ED Discharge Orders     None         Victoriano Lain 09/16/23 1924    Bethann Berkshire, MD 09/17/23 1452

## 2023-09-16 NOTE — ED Notes (Addendum)
TOC consulted as pt has not been able to get to HD as she no longer has transportation through [employer]. CSW spoke to [employer] who states that pt has exhausted all of her rides for the year at this time due to switching to a new Medicaid plan. CSW spoke to HD clinic who states they spent hours on the phone yesterday with insurance, DSS caseworker and Medicaid caseworker to see if situation could be resolved. CSW updated that pt will need to reach out to [facility] about getting back on the individual plan rather than the family plan as that one does not have coverage for HD rides. CSW updated attending of this. TOC signing off.   Addendum: CSW spoke to supervisor Angelica Chessman with DSS who confirms pt has Medicaid direct which is what allows pt transportation with [employer]. CSW spoke to [facility] who confirms they can pull pt up in their system, they asked that HD clinic send new referral over for pt and they will start back transportation. CSW then spoke with Boyd Kerbs at HD clinic to provide update, she will send needed documents to [employer]. ED staff updated as well. TOC signing off.

## 2023-09-16 NOTE — Progress Notes (Signed)
Contacted by ED MD.  Cathy [name] has missed multiple treatments in the last 2 weeks and presented to the ED via EMS c/o SOB and chest pain.  Last HD session was 09/10/23.  K elevated SpO2 100%, CXR without change from prior exam.  Will plan for HD today due to hyperkalemia and need for dialysis.  Hopefully she can be discharged after HD.  Will need to change her medicaid due to transportation issues.

## 2023-09-16 NOTE — ED Triage Notes (Signed)
Pt BIB [facility] for c/o chest pain and sob x 3 days; pt has not been to dialysis in a week  Pt states she has not been able to go to dialysis due to transportation

## 2023-09-16 NOTE — ED Provider Notes (Signed)
Signed out to me by Burgess Amor, PA-C pending re evaluation after her dialysis   Patient was here for abdominal pain, distention, chest pain.  Missed her dialysis Saturday and again yesterday.  States that she missed her dialysis due to an issue with transportation. Reports similar symptoms in the past with missed dialysis appointments.  See previous provider note for complete H&P  Patient was evaluated and sent for hemodialysis here.  Patient was returned after dialysis, she is requesting discharge home.  Remains hypertensive has not taken her antihypertensive medications, given her nighttime dose of losartan.  Labs improved, symptoms resolved after receiving her dialysis.  Felt to be appropriate for discharge home.   Initial workup showed potassium of 6.1, serum creatinine 15.74 BUN 83.  After dialysis, I stat chem 8 shows potassium now 4.3.  Creatinine 9.5     Pauline Aus, PA-C 09/16/23 2205    Franne Forts, DO 09/18/23 0207

## 2023-09-17 LAB — HEPATITIS B SURFACE ANTIBODY, QUANTITATIVE: Hep B S AB Quant (Post): 51.5 m[IU]/mL

## 2023-09-18 ENCOUNTER — Other Ambulatory Visit: Payer: Self-pay | Admitting: Family Medicine

## 2023-09-18 DIAGNOSIS — R52 Pain, unspecified: Secondary | ICD-10-CM

## 2023-09-20 NOTE — Progress Notes (Deleted)
   Maryclare Labrador, female    DOB: 1981/02/08    MRN: [id number]   Brief patient profile:  42  yo*** *** referred to pulmonary clinic in [place]  09/21/2023 by *** for ***      History of Present Illness  09/21/2023  Pulmonary/ 1st office eval/ Sherene Sires / [place] [facility]  No chief complaint on file.    Dyspnea:  *** Cough: *** Sleep: *** SABA use: *** 02: *** Lung cancer screen: ***   Outpatient Medications Prior to Visit  Medication Sig Dispense Refill   amLODipine (NORVASC) 10 MG tablet Take 1 tablet (10 mg total) by mouth in the morning. 30 tablet 2   Blood Pressure Monitoring (OMRON 3 SERIES BP MONITOR) DEVI Use as directed 2 times a day 3 kit 0   busPIRone (BUSPAR) 7.5 MG tablet Take 1 tablet (7.5 mg total) by mouth 2 (two) times daily. 60 tablet 2   carvedilol (COREG) 25 MG tablet Take 1 tablet (25 mg total) by mouth 2 (two) times daily with a meal. 60 tablet 1   cloNIDine (CATAPRES) 0.3 MG tablet Take 1 tablet (0.3 mg total) by mouth 3 (three) times daily. 90 tablet 2   Doxercalciferol (HECTOROL IV)      DULoxetine (CYMBALTA) 30 MG capsule Take 1 capsule (30 mg total) by mouth daily. 30 capsule 3   fluticasone-salmeterol (ADVAIR) 100-50 MCG/ACT AEPB Inhale 1 puff into the lungs 2 (two) times daily. 60 each 6   hydrALAZINE (APRESOLINE) 100 MG tablet Take 1 tablet (100 mg total) by mouth 3 (three) times daily. 90 tablet 1   ipratropium (ATROVENT HFA) 17 MCG/ACT inhaler Inhale 2 puffs into the lungs every 6 (six) hours as needed for wheezing. (Patient not taking: Reported on 09/16/2023) 1 each 12   losartan (COZAAR) 50 MG tablet Take 50 mg by mouth at bedtime.     ondansetron (ZOFRAN) 4 MG tablet Take 1 tablet (4 mg total) by mouth every 8 (eight) hours as needed for nausea or vomiting. 12 tablet 0   pantoprazole (PROTONIX) 40 MG tablet Take 1 tablet (40 mg total) by mouth daily before breakfast. 30 tablet 1   No facility-administered medications prior to visit.     Past Medical History:  Diagnosis Date   Anasarca associated with disorder of kidney 06/08/2021   Anxiety    Asthma    Bipolar depression (HCC)    Depression    Dyspnea    ESRD (end stage renal disease) on dialysis (HCC) 10/2020   GERD (gastroesophageal reflux disease)    Gonorrhea    Lupus    Pelvic inflammatory disease (PID)    Pleural effusion 06/08/2021   Pulmonary embolism (HCC) 01/31/2022   Renal hypertension    Trichomonas infection       Objective:     There were no vitals taken for this visit.         Assessment   No problem-specific Assessment & Plan notes found for this encounter.     Sandrea Hughs, MD 09/20/2023

## 2023-09-21 ENCOUNTER — Other Ambulatory Visit: Payer: Self-pay | Admitting: Family Medicine

## 2023-09-21 ENCOUNTER — Institutional Professional Consult (permissible substitution): Payer: Medicare HMO | Admitting: Internal Medicine

## 2023-09-21 DIAGNOSIS — R52 Pain, unspecified: Secondary | ICD-10-CM

## 2023-09-21 MED ORDER — DULOXETINE HCL 30 MG PO CPEP: 30.0000 mg | ORAL_CAPSULE | Freq: Every day | ORAL | 0 refills | Status: DC

## 2023-09-21 MED ORDER — BUSPIRONE HCL 7.5 MG PO TABS: 7.5000 mg | ORAL_TABLET | Freq: Two times a day (BID) | ORAL | 0 refills | Status: DC

## 2023-09-23 ENCOUNTER — Encounter: Payer: Self-pay | Admitting: Hematology and Oncology

## 2023-09-24 ENCOUNTER — Telehealth (HOSPITAL_COMMUNITY): Payer: Self-pay

## 2023-09-24 NOTE — Telephone Encounter (Signed)
Called to confirm 09/28/23 appt no answer vm full

## 2023-09-28 ENCOUNTER — Ambulatory Visit (HOSPITAL_COMMUNITY): Payer: Medicare HMO | Admitting: Psychiatry

## 2023-09-28 ENCOUNTER — Encounter (HOSPITAL_COMMUNITY): Payer: Self-pay

## 2023-09-28 IMAGING — US IR ABDOMEN US LIMITED
1 series · 5 of 5 positions shown · non-contrast
Comparison: CT 01/07/2022

CLINICAL DATA: Evaluate for ascites and possible paracentesis.

EXAM:
LIMITED ABDOMEN ULTRASOUND FOR ASCITES
TECHNIQUE: Limited ultrasound survey for ascites was performed in all four
abdominal quadrants.

[Series 1: ir (person_name)/(person_name) · 5 acquisitions, 5 frames shown]
[im 1/5]
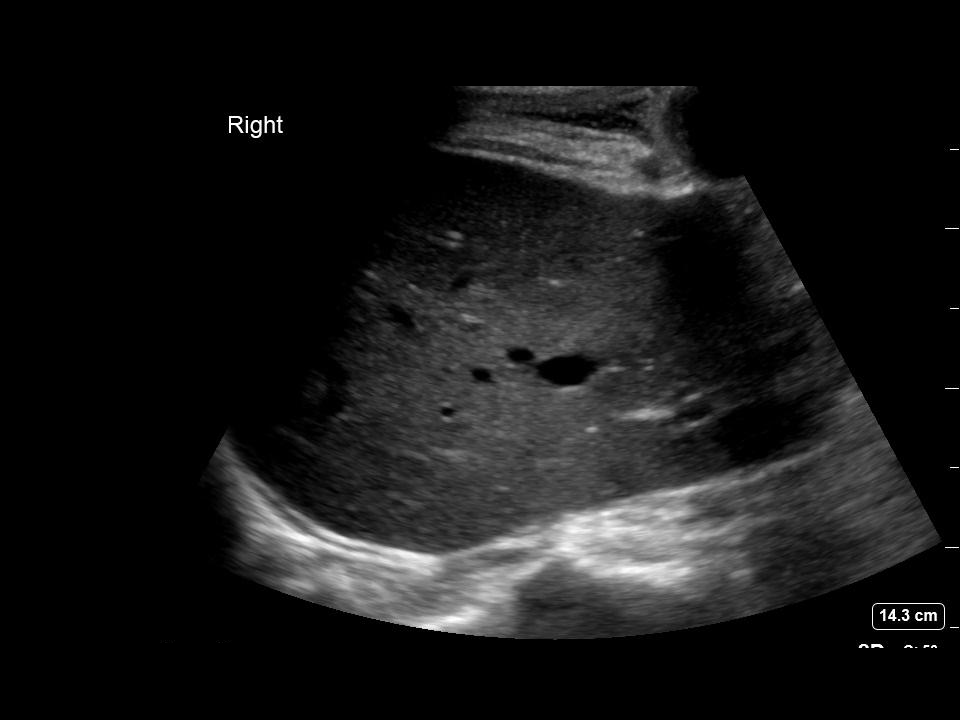
[im 2/5]
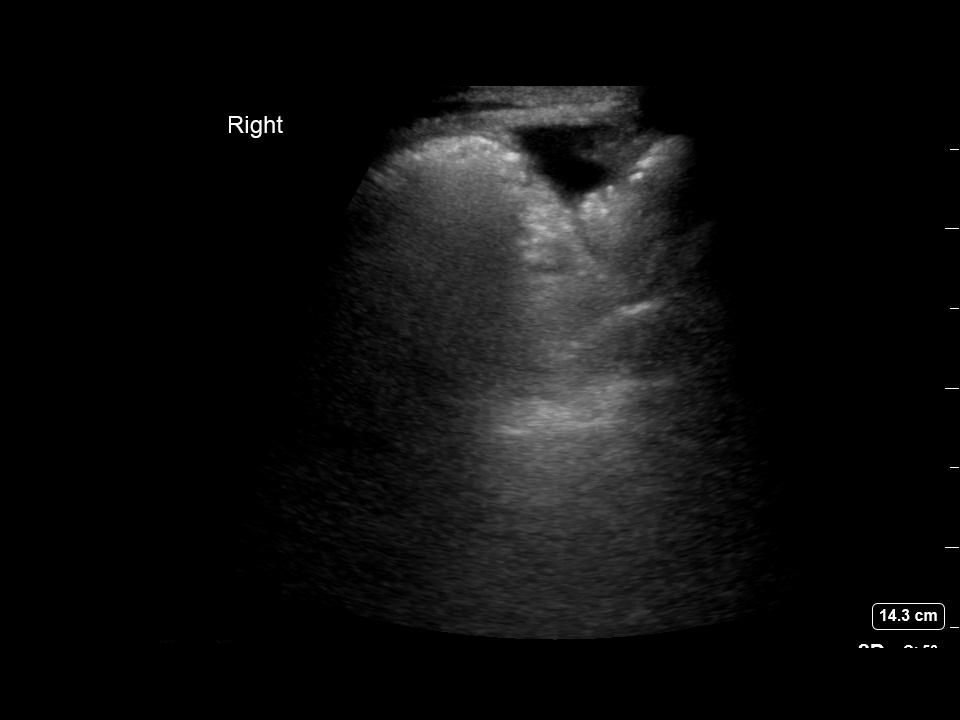
[im 3/5]
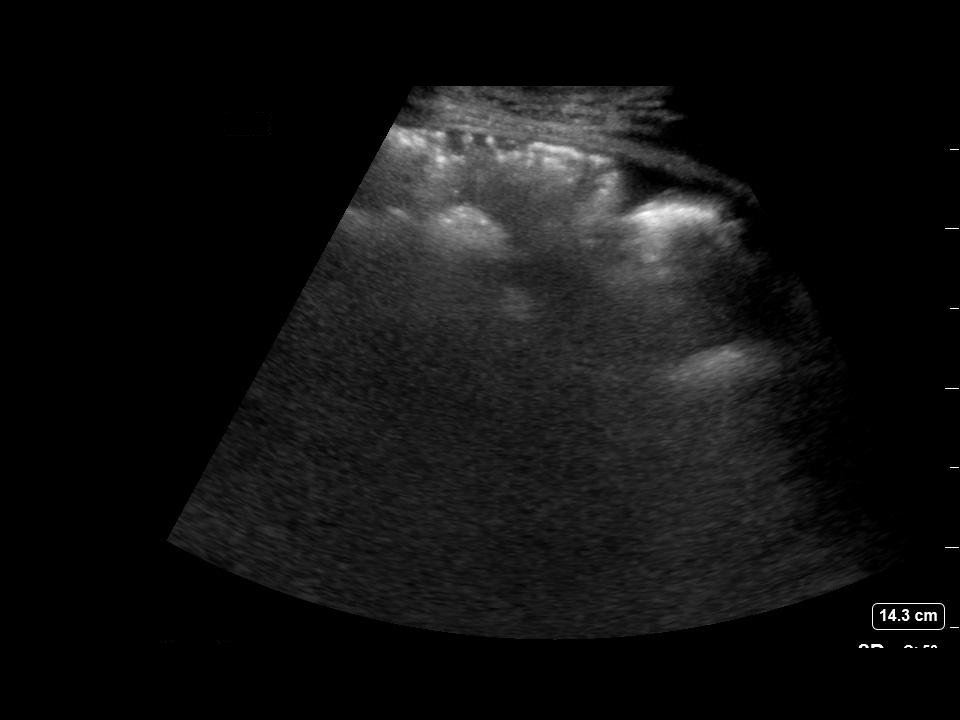
[im 4/5]
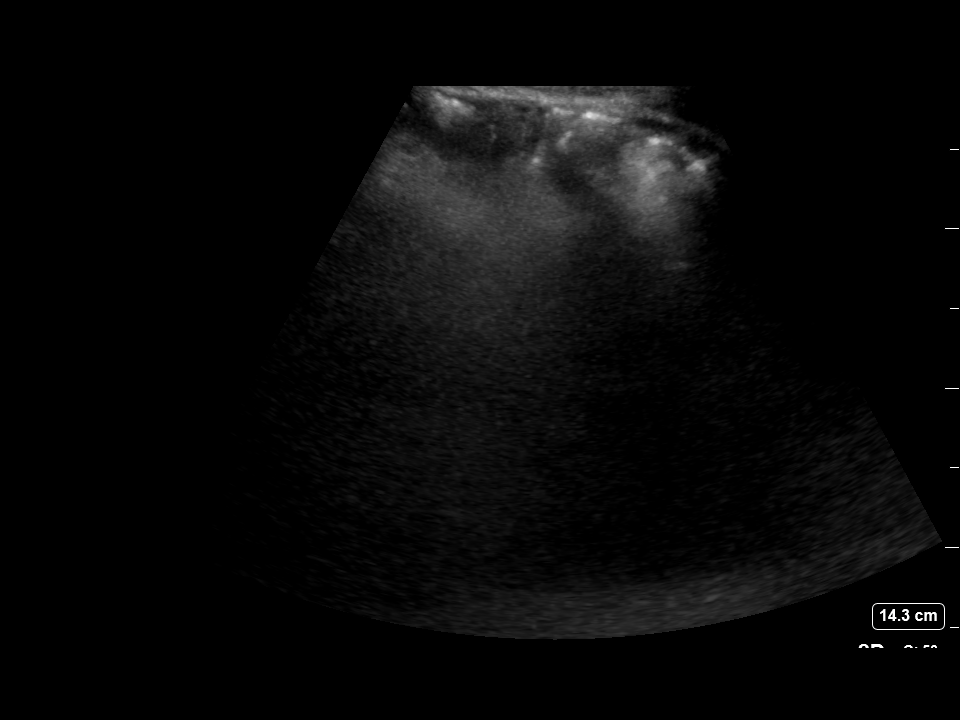
[im 5/5]
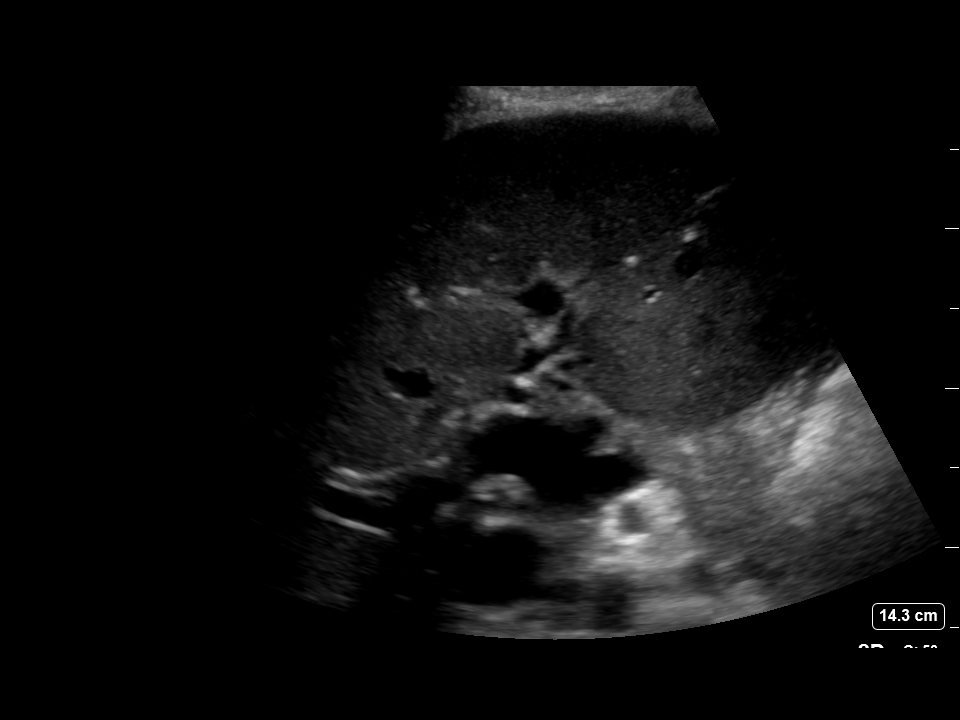

[5 of 5 positions shown; findings below may reference images not displayed]

FINDINGS: Trace abdominal ascites on the right side and left side of the
abdomen.
IMPRESSION: Trace abdominal ascites.  Paracentesis not performed.

## 2023-09-29 NOTE — Progress Notes (Unsigned)
Established Patient Office Visit   Subjective  Patient ID: Cathy [name], female    DOB: 06/20/1981  Age: 42 y.o. MRN: [id number]  No chief complaint on file.   She  has a past medical history of Anasarca associated with disorder of kidney (06/08/2021), Anxiety, Asthma, Bipolar depression (HCC), Depression, Dyspnea, ESRD (end stage renal disease) on dialysis (HCC) (10/2020), GERD (gastroesophageal reflux disease), Gonorrhea, Lupus, Pelvic inflammatory disease (PID), Pleural effusion (06/08/2021), Pulmonary embolism (HCC) (01/31/2022), Renal hypertension, and Trichomonas infection.  HPI  ROS    Objective:     There were no vitals taken for this visit. {Vitals History (Optional):23777}  Physical Exam   No results found for any visits on 09/30/23.  The ASCVD Risk score (Arnett DK, et al., 2019) failed to calculate for the following reasons:   The 2019 ASCVD risk score is only valid for ages 13 to 50    Assessment & Plan:  There are no diagnoses linked to this encounter.  No follow-ups on file.   Cruzita Lederer Newman Nip, FNP

## 2023-09-29 NOTE — Patient Instructions (Signed)
        Great to see you today.  I have refilled the medication(s) we provide.    - Please take medications as prescribed. - Follow up with your primary health provider if any health concerns arises. - If symptoms worsen please contact your primary care provider and/or visit the emergency department.  

## 2023-09-30 ENCOUNTER — Ambulatory Visit (INDEPENDENT_AMBULATORY_CARE_PROVIDER_SITE_OTHER): Payer: Medicare HMO | Admitting: Family Medicine

## 2023-09-30 ENCOUNTER — Encounter (INDEPENDENT_AMBULATORY_CARE_PROVIDER_SITE_OTHER): Payer: Self-pay | Admitting: *Deleted

## 2023-09-30 ENCOUNTER — Encounter: Payer: Self-pay | Admitting: Family Medicine

## 2023-09-30 VITALS — BP 170/98 | HR 66 | Ht 67.0 in | Wt 132.1 lb

## 2023-09-30 DIAGNOSIS — I1A0 Resistant hypertension: Secondary | ICD-10-CM

## 2023-09-30 DIAGNOSIS — Z8659 Personal history of other mental and behavioral disorders: Secondary | ICD-10-CM | POA: Diagnosis not present

## 2023-09-30 DIAGNOSIS — Z124 Encounter for screening for malignant neoplasm of cervix: Secondary | ICD-10-CM

## 2023-09-30 DIAGNOSIS — R1084 Generalized abdominal pain: Secondary | ICD-10-CM

## 2023-09-30 DIAGNOSIS — K219 Gastro-esophageal reflux disease without esophagitis: Secondary | ICD-10-CM

## 2023-09-30 DIAGNOSIS — R109 Unspecified abdominal pain: Secondary | ICD-10-CM | POA: Diagnosis not present

## 2023-09-30 DIAGNOSIS — F419 Anxiety disorder, unspecified: Secondary | ICD-10-CM

## 2023-09-30 DIAGNOSIS — F32A Depression, unspecified: Secondary | ICD-10-CM

## 2023-09-30 MED ORDER — BUSPIRONE HCL 15 MG PO TABS: 15.0000 mg | ORAL_TABLET | Freq: Two times a day (BID) | ORAL | 2 refills | Status: DC

## 2023-09-30 MED ORDER — ONDANSETRON HCL 4 MG PO TABS
4.0000 mg | ORAL_TABLET | Freq: Three times a day (TID) | ORAL | 0 refills | Status: DC | PRN
Start: 2023-09-30 — End: 2024-02-23

## 2023-09-30 NOTE — Assessment & Plan Note (Addendum)
Blood pressure not controlled in today's visit Patient reported did not take her medications today Advise the importance of medication compliance to reduce the risk of cardiac complications  Referral placed to cardiology , contact information provided to schedule an appointment  Hx CHF   Current Regimen: amlodipine 10 mg daily, carvedilol 25 mg twice daily, clonidine 0.3 mg 2 twice daily, Hydralazine 100 mg 3 times daily.   Continued discussion to take  prescribed medication, & take it as directed and avoid skipping doses. Seek emergency care if your blood pressure is (over 180/100) or you experience chest pain, shortness of breath, or sudden vision changes.Patient verbalizes understanding regarding plan of care and all questions answered.

## 2023-09-30 NOTE — Assessment & Plan Note (Signed)
    09/30/2023    8:56 AM 08/27/2023    8:51 AM 02/12/2022    2:51 PM 01/12/2019   10:36 AM  GAD 7 : Generalized Anxiety Score  Nervous, Anxious, on Edge 1 1 0 1  Control/stop worrying 2 1 0 1  Worry too much - different things 2 1 0 1  Trouble relaxing 2 2 1 1   Restless 1 1 0 0  Easily annoyed or irritable 2 2 1 1   Afraid - awful might happen 1 0 0 0  Total GAD 7 Score 11 8 2 5   Anxiety Difficulty Somewhat difficult Somewhat difficult       Flowsheet Row Office Visit from 09/30/2023 in [facility]  PHQ-9 Total Score 11     Increased Buspar 15 mg twice daily Continue Cymbalta 30 mg daily Referral placed to Psychiatry patient reports hx bipolar   We discussed several non-pharmacological approaches to managing depression, including:  Establishing a consistent daily routine: This helps create structure and stability. Practicing mindfulness and relaxation techniques: Incorporating meditation, deep breathing exercises, or yoga to manage stress and improve emotional well-being. Engaging in regular physical activity: Aim for at least 30 minutes of exercise most days to boost mood and energy levels. Spending time outdoors: Exposure to natural light and fresh air can improve mental health. Building a support network: Encouraging social connections with friends, family, or support groups to reduce feelings of isolation. Prioritizing a balanced diet: Eating nutrient-rich foods while avoiding excessive amounts of processed foods, sugar, and unhealthy fats. Follow-up is recommended in 4-8 weeks to assess progress, with a referral to behavioral health for further support if needed.

## 2023-09-30 NOTE — Assessment & Plan Note (Signed)
Chronic now acute Abdominal CT ordered  Advise Bland diet as tolerated. Avoid fluids that have a lot sugar or caffeine, Avoid spicy or fatty food. Avoid alcohol. Can take OTC tylenol for pain. Follow-up in unable to keep food/fluid down x 24 hours, dizziness, fevers, worsening or persistent symptoms to present to ED or contact primary care provider. Patient verbalizes understanding regarding plan of care and all questions answered.

## 2023-10-02 ENCOUNTER — Encounter: Payer: Self-pay | Admitting: Hematology and Oncology

## 2023-10-02 ENCOUNTER — Ambulatory Visit (HOSPITAL_COMMUNITY): Payer: Medicare HMO

## 2023-10-05 ENCOUNTER — Ambulatory Visit (HOSPITAL_COMMUNITY)
Admission: RE | Admit: 2023-10-05 | Discharge: 2023-10-05 | Disposition: A | Discharge status: Home / Self Care | Payer: Medicare HMO | Source: Ambulatory Visit | Attending: Family Medicine | Admitting: Family Medicine

## 2023-10-05 DIAGNOSIS — R109 Unspecified abdominal pain: Secondary | ICD-10-CM | POA: Insufficient documentation

## 2023-10-07 ENCOUNTER — Encounter (INDEPENDENT_AMBULATORY_CARE_PROVIDER_SITE_OTHER): Payer: Self-pay

## 2023-10-07 ENCOUNTER — Inpatient Hospital Stay: Payer: Medicare HMO | Admitting: Oncology

## 2023-10-07 ENCOUNTER — Ambulatory Visit (INDEPENDENT_AMBULATORY_CARE_PROVIDER_SITE_OTHER): Payer: Medicare HMO | Admitting: Gastroenterology

## 2023-10-07 ENCOUNTER — Other Ambulatory Visit: Payer: Medicare HMO

## 2023-10-09 ENCOUNTER — Encounter: Payer: Self-pay | Admitting: Hematology and Oncology

## 2023-10-13 ENCOUNTER — Emergency Department (HOSPITAL_COMMUNITY)
Admission: EM | Admit: 2023-10-13 | Discharge: 2023-10-13 | Disposition: A | Discharge status: Home / Self Care | Payer: Medicare HMO | Attending: Emergency Medicine | Admitting: Emergency Medicine

## 2023-10-13 ENCOUNTER — Encounter (HOSPITAL_COMMUNITY): Payer: Self-pay | Admitting: Emergency Medicine

## 2023-10-13 ENCOUNTER — Other Ambulatory Visit: Payer: Self-pay

## 2023-10-13 DIAGNOSIS — Z79899 Other long term (current) drug therapy: Secondary | ICD-10-CM | POA: Diagnosis not present

## 2023-10-13 DIAGNOSIS — N186 End stage renal disease: Secondary | ICD-10-CM | POA: Insufficient documentation

## 2023-10-13 DIAGNOSIS — I509 Heart failure, unspecified: Secondary | ICD-10-CM | POA: Diagnosis not present

## 2023-10-13 DIAGNOSIS — K047 Periapical abscess without sinus: Secondary | ICD-10-CM | POA: Insufficient documentation

## 2023-10-13 DIAGNOSIS — K0889 Other specified disorders of teeth and supporting structures: Secondary | ICD-10-CM | POA: Diagnosis present

## 2023-10-13 MED ORDER — AMOXICILLIN-POT CLAVULANATE 875-125 MG PO TABS: 1.0000 | ORAL_TABLET | Freq: Two times a day (BID) | ORAL | 0 refills | Status: DC

## 2023-10-13 MED ORDER — AMOXICILLIN-POT CLAVULANATE 875-125 MG PO TABS
1.0000 | ORAL_TABLET | Freq: Once | ORAL | Status: AC
  Administered 2023-10-13: 1 via ORAL
  Filled 2023-10-13: qty 1

## 2023-10-13 MED ORDER — HYDROCODONE-ACETAMINOPHEN 5-325 MG PO TABS
1.0000 | ORAL_TABLET | Freq: Once | ORAL | Status: AC
  Administered 2023-10-13: 1 via ORAL
  Filled 2023-10-13: qty 1

## 2023-10-13 MED ORDER — HYDROCODONE-ACETAMINOPHEN 5-325 MG PO TABS
1.0000 | ORAL_TABLET | Freq: Three times a day (TID) | ORAL | 0 refills | Status: DC | PRN
Start: 2023-10-13 — End: 2024-02-01

## 2023-10-13 NOTE — Discharge Instructions (Addendum)
Evaluation revealed that you have a periapical abscess which is an abscess near your teeth.  I am treating with Augmentin which is an antibiotic.  Please take the entire course even if you are feeling better.  Also highly recommend that you reach out to your dentist and schedule an earlier appointment.  Also sent a few tablets of Norco to your pharmacy.

## 2023-10-13 NOTE — ED Triage Notes (Signed)
Pt presents with abscess to left side of face, x 3 days.

## 2023-10-13 NOTE — ED Notes (Signed)
Pt complains of tooth abscess that started 3 days ago Bottom LEFT side of mouth Hurts to eat Pain 10/10

## 2023-10-13 NOTE — ED Provider Notes (Signed)
[place] EMERGENCY DEPARTMENT AT [facility] Provider Note   CSN: [id number] Arrival date & time: 10/13/23  1325     History  Chief Complaint  Patient presents with   Abscess    Facial (Left)   HPI Cathy [name] is a 42 y.o. female with history of lupus, ESRD, PEs, CHF presenting for dental pain.  Started 3 days ago.  Located in the lower left teeth.  States she may have an abscess there.  States she has a schedule appoint with a dentist on Monday.  Denies fever.  Denies any swelling under her tongue.  Denies any trouble eating or drinking.  States the pain is very severe and she is unsure if she "can make it until her appointment".   Abscess      Home Medications Prior to Admission medications   Medication Sig Start Date End Date Taking? Authorizing Provider  amoxicillin-clavulanate (AUGMENTIN) 875-125 MG tablet Take 1 tablet by mouth every 12 (twelve) hours. 10/13/23  Yes Gareth Eagle, PA-C  HYDROcodone-acetaminophen (NORCO/VICODIN) 5-325 MG tablet Take 1 tablet by mouth every 8 (eight) hours as needed for severe pain (pain score 7-10). 10/13/23  Yes Gareth Eagle, PA-C  amLODipine (NORVASC) 10 MG tablet Take 1 tablet (10 mg total) by mouth in the morning. 08/27/23   Del Nigel Berthold, FNP  Blood Pressure Monitoring (OMRON 3 SERIES BP MONITOR) DEVI Use as directed 2 times a day 08/27/23   Del Nigel Berthold, FNP  busPIRone (BUSPAR) 15 MG tablet Take 1 tablet (15 mg total) by mouth 2 (two) times daily. 09/30/23   Del Nigel Berthold, FNP  carvedilol (COREG) 25 MG tablet Take 1 tablet (25 mg total) by mouth 2 (two) times daily with a meal. 08/27/23   [name], Tenna Child, FNP  cloNIDine (CATAPRES) 0.3 MG tablet Take 1 tablet (0.3 mg total) by mouth 3 (three) times daily. 08/27/23   Del Nigel Berthold, FNP  Doxercalciferol (HECTOROL IV)  03/19/23   [provider]  DULoxetine (CYMBALTA) 30 MG capsule Take 1 capsule (30 mg total)  by mouth daily. 09/21/23   Del Nigel Berthold, FNP  fluticasone-salmeterol (ADVAIR) 100-50 MCG/ACT AEPB Inhale 1 puff into the lungs 2 (two) times daily. 08/27/23   Del Nigel Berthold, FNP  hydrALAZINE (APRESOLINE) 100 MG tablet Take 1 tablet (100 mg total) by mouth 3 (three) times daily. 08/27/23   Del Nigel Berthold, FNP  ipratropium (ATROVENT HFA) 17 MCG/ACT inhaler Inhale 2 puffs into the lungs every 6 (six) hours as needed for wheezing. Patient not taking: Reported on 09/16/2023 12/17/21 03/23/23  Rai, Delene Ruffini, MD  losartan (COZAAR) 50 MG tablet Take 50 mg by mouth at bedtime. 01/27/23   [provider]  ondansetron (ZOFRAN) 4 MG tablet Take 1 tablet (4 mg total) by mouth every 8 (eight) hours as needed for nausea or vomiting. 09/30/23   Del Nigel Berthold, FNP  pantoprazole (PROTONIX) 40 MG tablet Take 1 tablet (40 mg total) by mouth daily before breakfast. 08/27/23 10/26/23  Del Nigel Berthold, FNP  fenofibrate 160 MG tablet Take 1 tablet (160 mg total) by mouth daily. 12/31/20 01/20/21  Regalado, Prentiss Bells, MD      Allergies    Dialyvite 800 [nephro-vite]    Review of Systems   See HPI for pertinent positives  Physical Exam Constitutional:      Appearance: Normal appearance.  HENT:     Head: Normocephalic.  Nose: Nose normal.     Mouth/Throat:      Comments: Multiple fractures and tooth avulsions noted as well in the mouth.  Gingival swelling noted near the abscess.  No pustular oozing or bleeding noted. Eyes:     Conjunctiva/sclera: Conjunctivae normal.  Pulmonary:     Effort: Pulmonary effort is normal.  Neurological:     Mental Status: She is alert.  Psychiatric:        Mood and Affect: Mood normal.      ED Results / Procedures / Treatments   Labs (all labs ordered are listed, but only abnormal results are displayed) Labs Reviewed - No data to display  EKG None  Radiology No results found.  Procedures Procedures     Medications Ordered in ED Medications  HYDROcodone-acetaminophen (NORCO/VICODIN) 5-325 MG per tablet 1 tablet (1 tablet Oral Given 10/13/23 1421)  amoxicillin-clavulanate (AUGMENTIN) 875-125 MG per tablet 1 tablet (1 tablet Oral Given 10/13/23 1423)    ED Course/ Medical Decision Making/ A&P                                 Medical Decision Making  42 year old well-appearing female presenting for dental pain. Exam notable for small periapical abscess in the left lower teeth and gingival swelling.  Treated with Augmentin and Norco.  Advised to follow-up with her dentist. Findings do not suggest Ludwig's angina or other deep space abscess.  Patient looks well and nontoxic and hemodynamically stable.  Discussed return precautions.  Discharged in good condition.        Final Clinical Impression(s) / ED Diagnoses Final diagnoses:  Dental infection  Periapical abscess    Rx / DC Orders ED Discharge Orders          Ordered    HYDROcodone-acetaminophen (NORCO/VICODIN) 5-325 MG tablet  Every 8 hours PRN        10/13/23 1427    amoxicillin-clavulanate (AUGMENTIN) 875-125 MG tablet  Every 12 hours        10/13/23 1427              Gareth Eagle, PA-C 10/13/23 1427    Sloan Leiter, DO 10/14/23 [phone]

## 2023-10-13 NOTE — Progress Notes (Deleted)
   Cathy [name], female    DOB: [birth year]    MRN: [id number]   Brief patient profile:  43  yobf  *** referred to pulmonary clinic in [place]  10/14/2023 by *** for ***      History of Present Illness  10/14/2023  Pulmonary/ 1st office eval/ Sherene Sires / [place] Office  No chief complaint on file.    Dyspnea:  *** Cough: *** Sleep: *** SABA use: *** 02: *** Lung cancer screen: ***   Outpatient Medications Prior to Visit  Medication Sig Dispense Refill   amLODipine (NORVASC) 10 MG tablet Take 1 tablet (10 mg total) by mouth in the morning. 30 tablet 2   Blood Pressure Monitoring (OMRON 3 SERIES BP MONITOR) DEVI Use as directed 2 times a day 3 kit 0   busPIRone (BUSPAR) 15 MG tablet Take 1 tablet (15 mg total) by mouth 2 (two) times daily. 60 tablet 2   carvedilol (COREG) 25 MG tablet Take 1 tablet (25 mg total) by mouth 2 (two) times daily with a meal. 60 tablet 1   cloNIDine (CATAPRES) 0.3 MG tablet Take 1 tablet (0.3 mg total) by mouth 3 (three) times daily. 90 tablet 2   Doxercalciferol (HECTOROL IV)      DULoxetine (CYMBALTA) 30 MG capsule Take 1 capsule (30 mg total) by mouth daily. 90 capsule 0   fluticasone-salmeterol (ADVAIR) 100-50 MCG/ACT AEPB Inhale 1 puff into the lungs 2 (two) times daily. 60 each 6   hydrALAZINE (APRESOLINE) 100 MG tablet Take 1 tablet (100 mg total) by mouth 3 (three) times daily. 90 tablet 1   ipratropium (ATROVENT HFA) 17 MCG/ACT inhaler Inhale 2 puffs into the lungs every 6 (six) hours as needed for wheezing. (Patient not taking: Reported on 09/16/2023) 1 each 12   losartan (COZAAR) 50 MG tablet Take 50 mg by mouth at bedtime.     ondansetron (ZOFRAN) 4 MG tablet Take 1 tablet (4 mg total) by mouth every 8 (eight) hours as needed for nausea or vomiting. 12 tablet 0   pantoprazole (PROTONIX) 40 MG tablet Take 1 tablet (40 mg total) by mouth daily before breakfast. 30 tablet 1   No facility-administered medications prior to visit.    Past  Medical History:  Diagnosis Date   Anasarca associated with disorder of kidney 06/08/2021   Anxiety    Asthma    Bipolar depression (HCC)    Depression    Dyspnea    ESRD (end stage renal disease) on dialysis (HCC) 10/2020   GERD (gastroesophageal reflux disease)    Gonorrhea    Lupus    Pelvic inflammatory disease (PID)    Pleural effusion 06/08/2021   Pulmonary embolism (HCC) 01/31/2022   Renal hypertension    Trichomonas infection       Objective:     There were no vitals taken for this visit.         Assessment   No problem-specific Assessment & Plan notes found for this encounter.     Sandrea Hughs, MD 10/13/2023

## 2023-10-14 ENCOUNTER — Institutional Professional Consult (permissible substitution): Payer: Medicare HMO | Admitting: Internal Medicine

## 2023-10-14 ENCOUNTER — Encounter: Payer: Self-pay | Admitting: Internal Medicine

## 2023-10-19 ENCOUNTER — Other Ambulatory Visit: Payer: Self-pay | Admitting: Family Medicine

## 2023-10-19 ENCOUNTER — Inpatient Hospital Stay: Payer: Medicare HMO | Attending: Oncology | Admitting: Oncology

## 2023-10-19 ENCOUNTER — Inpatient Hospital Stay: Payer: Medicare HMO

## 2023-10-19 VITALS — BP 188/115 | HR 64 | Temp 96.2°F | Resp 18 | Ht 66.73 in | Wt 139.2 lb

## 2023-10-19 DIAGNOSIS — M329 Systemic lupus erythematosus, unspecified: Secondary | ICD-10-CM

## 2023-10-19 DIAGNOSIS — R7989 Other specified abnormal findings of blood chemistry: Secondary | ICD-10-CM

## 2023-10-19 DIAGNOSIS — K862 Cyst of pancreas: Secondary | ICD-10-CM

## 2023-10-19 DIAGNOSIS — M3214 Glomerular disease in systemic lupus erythematosus: Secondary | ICD-10-CM | POA: Diagnosis not present

## 2023-10-19 DIAGNOSIS — Z720 Tobacco use: Secondary | ICD-10-CM | POA: Diagnosis not present

## 2023-10-19 DIAGNOSIS — N1330 Unspecified hydronephrosis: Secondary | ICD-10-CM

## 2023-10-19 LAB — CBC WITH DIFFERENTIAL/PLATELET
Abs Immature Granulocytes: 0.04 10*3/uL (ref 0.00–0.07)
Basophils Absolute: 0 10*3/uL (ref 0.0–0.1)
Basophils Relative: 1 %
Eosinophils Absolute: 0.2 10*3/uL (ref 0.0–0.5)
Eosinophils Relative: 4 %
HCT: 33.1 % — ABNORMAL LOW (ref 36.0–46.0)
Hemoglobin: 10.5 g/dL — ABNORMAL LOW (ref 12.0–15.0)
Immature Granulocytes: 1 %
Lymphocytes Relative: 11 %
Lymphs Abs: 0.5 10*3/uL — ABNORMAL LOW (ref 0.7–4.0)
MCH: 30.1 pg (ref 26.0–34.0)
MCHC: 31.7 g/dL (ref 30.0–36.0)
MCV: 94.8 fL (ref 80.0–100.0)
Monocytes Absolute: 0.3 10*3/uL (ref 0.1–1.0)
Monocytes Relative: 7 %
Neutro Abs: 3.4 10*3/uL (ref 1.7–7.7)
Neutrophils Relative %: 76 %
Platelets: 143 10*3/uL — ABNORMAL LOW (ref 150–400)
RBC: 3.49 MIL/uL — ABNORMAL LOW (ref 3.87–5.11)
RDW: 19.5 % — ABNORMAL HIGH (ref 11.5–15.5)
WBC: 4.5 10*3/uL (ref 4.0–10.5)
nRBC: 0 % (ref 0.0–0.2)

## 2023-10-19 LAB — COMPREHENSIVE METABOLIC PANEL
ALT: 19 U/L (ref 0–44)
AST: 19 U/L (ref 15–41)
Albumin: 3.1 g/dL — ABNORMAL LOW (ref 3.5–5.0)
Alkaline Phosphatase: 174 U/L — ABNORMAL HIGH (ref 38–126)
Anion gap: 17 — ABNORMAL HIGH (ref 5–15)
BUN: 80 mg/dL — ABNORMAL HIGH (ref 6–20)
CO2: 26 mmol/L (ref 22–32)
Calcium: 8.5 mg/dL — ABNORMAL LOW (ref 8.9–10.3)
Chloride: 94 mmol/L — ABNORMAL LOW (ref 98–111)
Creatinine, Ser: 14.14 mg/dL — ABNORMAL HIGH (ref 0.44–1.00)
GFR, Estimated: 3 mL/min — ABNORMAL LOW (ref >60)
Glucose, Bld: 87 mg/dL (ref 70–99)
Potassium: 5 mmol/L (ref 3.5–5.1)
Sodium: 137 mmol/L (ref 135–145)
Total Bilirubin: 0.8 mg/dL (ref <1.2)
Total Protein: 6.8 g/dL (ref 6.5–8.1)

## 2023-10-19 LAB — FERRITIN: Ferritin: 858 ng/mL — ABNORMAL HIGH (ref 11–307)

## 2023-10-19 LAB — IRON AND TIBC
Iron: 51 ug/dL (ref 28–170)
Saturation Ratios: 21 % (ref 10.4–31.8)
TIBC: 247 ug/dL — ABNORMAL LOW (ref 250–450)
UIBC: 196 ug/dL

## 2023-10-19 MED ORDER — AZITHROMYCIN 250 MG PO TABS: ORAL_TABLET | ORAL | 0 refills | Status: DC

## 2023-10-19 NOTE — Assessment & Plan Note (Addendum)
Ferritin is a acute phase reactant.  Patient has multiple inflammatory conditions which likely led to an increase in ferritin. -Repeat ferritin level today and monitor every 3 months. -If ferritin is <1000 we will not consider further workup.  No indication for iron overload workup at this time  Return to clinic in 3 months

## 2023-10-19 NOTE — Assessment & Plan Note (Signed)
Smoking approximately 3-4 cigarettes per day. -Encourage smoking cessation.

## 2023-10-19 NOTE — Assessment & Plan Note (Signed)
ESRD secondary to lupus nephritis.  On dialysis on T/T/S -Continue to follow with nephrology

## 2023-10-19 NOTE — Progress Notes (Signed)
Please inform patient,  Abdominal CT Results Summary:  Severe Right Hydronephrosis: Swelling in the right kidney due to a possible past stone or blockage. - Referral placed to Urologist for further evaluation.    Right Lower Lobe Pneumonia: Infection in the lower right lung. Antibiotics sent to pharmacy.  Trace Pleural Effusion: Small fluid around the left lung. Ascites: Small amount of fluid in the abdomen. Mild Swelling: Generalized body swelling due to fluid retention.  Artery Narrowing: Age-related hardening of abdominal and pelvic arteries.   Advise for heart health, focus on: Eat more fruits and vegetables: Aim for a variety of colors. Choose whole grains: Brown rice, oats, and whole-wheat bread. Limit unhealthy fats: Avoid trans fats; use olive or avocado oil instead. Include lean proteins: Opt for fish, chicken, beans, and legumes. Reduce sodium: Limit processed foods and add less salt.

## 2023-10-19 NOTE — Progress Notes (Signed)
7 mm cyst at the pancreatic head  - Referral placed to GI.

## 2023-10-19 NOTE — Assessment & Plan Note (Signed)
Stable, with upcoming appointment with rheumatologist. -Continue current management and follow-up with rheumatology

## 2023-10-19 NOTE — Patient Instructions (Signed)
VISIT SUMMARY:  During today's visit, we discussed your elevated ferritin levels, lupus management, abdominal pain, and tobacco use. We also reviewed your general health maintenance plan.  YOUR PLAN:  -ELEVATED FERRITIN: Elevated ferritin levels can be due to your lupus and dialysis. We will repeat your ferritin level today and monitor it every 3 months.  -SYSTEMIC LUPUS ERYTHEMATOSUS: Lupus is an autoimmune disease where the body's immune system attacks its own tissues. Your condition is stable, and you should continue with your current management plan. You have an upcoming appointment with your rheumatologist.  -ABDOMINAL PAIN: You are experiencing severe abdominal pain that feels like hunger but isn't relieved by eating, along with nausea. We are waiting for the results of your recent CT scan and will plan follow-up care based on those results.  -TOBACCO USE: You are currently smoking 3-4 cigarettes per day. It is important to quit smoking, and we encourage you to consider smoking cessation options.  -GENERAL HEALTH MAINTENANCE: Continue with your regular dialysis sessions. We will review your routine labs and studies during your follow-up visit in 3 months, or sooner if your CT scan results are concerning.  INSTRUCTIONS:  Please return for a follow-up visit in 3 months, or sooner if the CT scan results are concerning. Repeat your ferritin level today and continue with your regular dialysis sessions. Consider smoking cessation options to improve your overall health.

## 2023-10-19 NOTE — Progress Notes (Signed)
[facility] at [facility] HEMATOLOGY NEW VISIT  Cathy [name], Cathy [name], [place]  REASON FOR REFERRAL: Elevated ferritin   HISTORY OF PRESENT ILLNESS: Cathy [name] 42 y.o. female referred for elevated ferritin.  Patient has a past medical history of SLE, ESRD on dialysis, PEs, CHF.She reports feeling 'drained' and 'always tired,' which she attributes to her blood pressure medications. She also describes ongoing abdominal pain, rated as 7-8/10, which she describes as 'short pains' and 'like a hunger feeling, but you're not hungry.' This is associated with nausea. She has had a recent CT scan for this issue, but the results are not yet available. She also reports coughing up mucus, sometimes with blood streaks.   She smokes approximately 3-4 cigarettes per day and denies alcohol use. She has a family history of lupus in one cousin.  I have reviewed the past medical history, past surgical history, social history and family history with the patient   ALLERGIES:  is allergic to dialyvite 800 [nephro-vite].  MEDICATIONS:  Current Outpatient Medications  Medication Sig Dispense Refill   amLODipine (NORVASC) 10 MG tablet Take 1 tablet (10 mg total) by mouth in the morning. 30 tablet 2   amoxicillin-clavulanate (AUGMENTIN) 875-125 MG tablet Take 1 tablet by mouth every 12 (twelve) hours. 14 tablet 0   Blood Pressure Monitoring (OMRON 3 SERIES BP MONITOR) DEVI Use as directed 2 times a day 3 kit 0   busPIRone (BUSPAR) 15 MG tablet Take 1 tablet (15 mg total) by mouth 2 (two) times daily. 60 tablet 2   carvedilol (COREG) 25 MG tablet Take 1 tablet (25 mg total) by mouth 2 (two) times daily with a meal. 60 tablet 1   cloNIDine (CATAPRES) 0.3 MG tablet Take 1 tablet (0.3 mg total) by mouth 3 (three) times daily. 90 tablet 2   Doxercalciferol (HECTOROL IV)      DULoxetine (CYMBALTA) 30 MG capsule Take 1 capsule (30 mg total) by mouth daily. 90 capsule 0    fluticasone-salmeterol (ADVAIR) 100-50 MCG/ACT AEPB Inhale 1 puff into the lungs 2 (two) times daily. 60 each 6   hydrALAZINE (APRESOLINE) 100 MG tablet Take 1 tablet (100 mg total) by mouth 3 (three) times daily. 90 tablet 1   HYDROcodone-acetaminophen (NORCO/VICODIN) 5-325 MG tablet Take 1 tablet by mouth every 8 (eight) hours as needed for severe pain (pain score 7-10). 6 tablet 0   losartan (COZAAR) 50 MG tablet Take 50 mg by mouth at bedtime.     ondansetron (ZOFRAN) 4 MG tablet Take 1 tablet (4 mg total) by mouth every 8 (eight) hours as needed for nausea or vomiting. 12 tablet 0   pantoprazole (PROTONIX) 40 MG tablet Take 1 tablet (40 mg total) by mouth daily before breakfast. 30 tablet 1   sevelamer carbonate (RENVELA) 800 MG tablet Take 800 mg by mouth 3 (three) times daily.     azithromycin (ZITHROMAX) 250 MG tablet Take 2 tablets on day 1, then 1 tablet daily on days 2 through 5 6 tablet 0   ipratropium (ATROVENT HFA) 17 MCG/ACT inhaler Inhale 2 puffs into the lungs every 6 (six) hours as needed for wheezing. (Patient not taking: Reported on 09/16/2023) 1 each 12   No current facility-administered medications for this visit.     REVIEW OF SYSTEMS:   Constitutional: Denies fevers, chills or night sweats Eyes: Denies blurriness of vision Ears, nose, mouth, throat, and face: Denies mucositis or sore throat Respiratory: Denies cough, dyspnea or wheezes  Cardiovascular: Denies palpitation, chest discomfort or lower extremity swelling Gastrointestinal:  Denies nausea, heartburn or change in bowel habits Skin: Denies abnormal skin rashes Lymphatics: Denies new lymphadenopathy or easy bruising Neurological:Denies numbness, tingling or new weaknesses Behavioral/Psych: Mood is stable, no new changes  All other systems were reviewed with the patient and are negative.  PHYSICAL EXAMINATION:   Vitals:   10/19/23 1109  BP: (!) 188/115  Pulse: 64  Resp: 18  Temp: (!) 96.2 F (35.7 C)   SpO2: 100%    GENERAL:alert, no distress and comfortable SKIN: skin color, texture, turgor are normal, no rashes or significant lesions, hypopigmentation around the lips  EYES: normal, Conjunctiva are pink and non-injected, sclera clear LUNGS: clear to auscultation and percussion with normal breathing effort HEART: regular rate & rhythm and no murmurs and no lower extremity edema ABDOMEN: Abdomen distended, fluid thrill present Musculoskeletal:no cyanosis of digits and no clubbing  NEURO: alert & oriented x 3 with fluent speech  LABORATORY DATA:  I have reviewed the data as listed  Lab Results  Component Value Date   WBC 4.5 10/19/2023   NEUTROABS 3.4 10/19/2023   HGB 10.5 (L) 10/19/2023   HCT 33.1 (L) 10/19/2023   MCV 94.8 10/19/2023   PLT 143 (L) 10/19/2023      Component Value Date/Time   NA 137 10/19/2023 1157   NA 136 12/10/2018 1142   K 5.0 10/19/2023 1157   CL 94 (L) 10/19/2023 1157   CO2 26 10/19/2023 1157   GLUCOSE 87 10/19/2023 1157   BUN 80 (H) 10/19/2023 1157   BUN 9 12/10/2018 1142   CREATININE 14.14 (H) 10/19/2023 1157   CALCIUM 8.5 (L) 10/19/2023 1157   PROT 6.8 10/19/2023 1157   PROT 6.1 12/10/2018 1142   ALBUMIN 3.1 (L) 10/19/2023 1157   ALBUMIN 3.1 (L) 12/10/2018 1142   AST 19 10/19/2023 1157   ALT 19 10/19/2023 1157   ALKPHOS 174 (H) 10/19/2023 1157   BILITOT 0.8 10/19/2023 1157   BILITOT <0.2 12/10/2018 1142   GFRNONAA 3 (L) 10/19/2023 1157   GFRAA >60 08/07/2020 1209      Chemistry      Component Value Date/Time   NA 137 10/19/2023 1157   NA 136 12/10/2018 1142   K 5.0 10/19/2023 1157   CL 94 (L) 10/19/2023 1157   CO2 26 10/19/2023 1157   BUN 80 (H) 10/19/2023 1157   BUN 9 12/10/2018 1142   CREATININE 14.14 (H) 10/19/2023 1157      Component Value Date/Time   CALCIUM 8.5 (L) 10/19/2023 1157   ALKPHOS 174 (H) 10/19/2023 1157   AST 19 10/19/2023 1157   ALT 19 10/19/2023 1157   BILITOT 0.8 10/19/2023 1157   BILITOT <0.2 12/10/2018  1142      ASSESSMENT & PLAN:  Patient is a 42 year old female with multiple comorbidities presenting for elevated ferritin   Elevated ferritin Ferritin is a acute phase reactant.  Patient has multiple inflammatory conditions which likely led to an increase in ferritin. -Repeat ferritin level today and monitor every 3 months. -If ferritin is <1000 we will not consider further workup.  No indication for iron overload workup at this time  Return to clinic in 3 months  SLE (systemic lupus erythematosus related syndrome) (HCC) Stable, with upcoming appointment with rheumatologist. -Continue current management and follow-up with rheumatology  Tobacco use Smoking approximately 3-4 cigarettes per day. -Encourage smoking cessation.  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  On dialysis on T/T/S -  Continue to follow with nephrology   Orders Placed This Encounter  Procedures   Ferritin    Standing Status:   Future    Number of Occurrences:   1    Standing Expiration Date:   10/18/2024   CBC with Differential/Platelet    Standing Status:   Future    Number of Occurrences:   1    Standing Expiration Date:   10/18/2024   Comprehensive metabolic panel    Standing Status:   Future    Number of Occurrences:   1    Standing Expiration Date:   10/18/2024   Iron and TIBC    Standing Status:   Future    Number of Occurrences:   1    Standing Expiration Date:   10/18/2024    The total time spent in the appointment was 30 minutes encounter with patients including review of chart and various tests results, discussions about plan of care and coordination of care plan   All questions were answered. The patient knows to call the clinic with any problems, questions or concerns. No barriers to learning was detected.   Cindie Crumbly, MD 11/18/20244:53 PM

## 2023-10-21 ENCOUNTER — Telehealth: Payer: Self-pay

## 2023-10-21 NOTE — Telephone Encounter (Signed)
Awaiting a return call

## 2023-10-21 NOTE — Telephone Encounter (Signed)
Unable to get through to pts mother.

## 2023-10-23 ENCOUNTER — Institutional Professional Consult (permissible substitution): Payer: Medicare HMO | Admitting: Professional Counselor

## 2023-10-23 ENCOUNTER — Encounter: Payer: Self-pay | Admitting: Hematology and Oncology

## 2023-10-26 ENCOUNTER — Ambulatory Visit: Payer: Medicare HMO | Admitting: Gastroenterology

## 2023-10-27 ENCOUNTER — Encounter: Payer: Self-pay | Admitting: Gastroenterology

## 2023-10-27 ENCOUNTER — Ambulatory Visit: Payer: Medicare HMO | Admitting: Gastroenterology

## 2023-10-30 ENCOUNTER — Other Ambulatory Visit: Payer: Self-pay | Admitting: Family Medicine

## 2023-11-05 ENCOUNTER — Ambulatory Visit: Payer: Medicare HMO | Admitting: Internal Medicine

## 2023-11-05 ENCOUNTER — Encounter: Payer: Self-pay | Admitting: Internal Medicine

## 2023-11-23 ENCOUNTER — Encounter: Payer: Self-pay | Admitting: Hematology and Oncology

## 2023-11-27 ENCOUNTER — Institutional Professional Consult (permissible substitution): Payer: Medicare HMO | Admitting: Professional Counselor

## 2023-11-27 NOTE — Patient Instructions (Signed)

## 2023-11-27 NOTE — Progress Notes (Unsigned)
   Established Patient Office Visit   Subjective  Patient ID: Cathy [name], female    DOB: 1981-08-18  Age: 42 y.o. MRN: [id number]  No chief complaint on file.   She  has a past medical history of Anasarca associated with disorder of kidney (06/08/2021), Anxiety, Asthma, Bipolar depression (HCC), Depression, Dyspnea, ESRD (end stage renal disease) on dialysis (HCC) (10/2020), GERD (gastroesophageal reflux disease), Gonorrhea, Lupus, Pelvic inflammatory disease (PID), Pleural effusion (06/08/2021), Pulmonary embolism (HCC) (01/31/2022), Renal hypertension, and Trichomonas infection.  HPI  ROS    Objective:     There were no vitals taken for this visit. {Vitals History (Optional):23777}  Physical Exam   No results found for any visits on 11/30/23.  The 10-year ASCVD risk score (Arnett DK, et al., 2019) is: 45.1%    Assessment & Plan:  There are no diagnoses linked to this encounter.  No follow-ups on file.   Cruzita Lederer Newman Nip, FNP

## 2023-11-30 ENCOUNTER — Encounter (INDEPENDENT_AMBULATORY_CARE_PROVIDER_SITE_OTHER): Payer: Medicare HMO | Admitting: Family Medicine

## 2023-11-30 DIAGNOSIS — Z124 Encounter for screening for malignant neoplasm of cervix: Secondary | ICD-10-CM

## 2023-11-30 NOTE — Progress Notes (Signed)
NO SHOW

## 2023-12-01 ENCOUNTER — Ambulatory Visit: Payer: Medicare HMO | Admitting: Urology

## 2023-12-09 ENCOUNTER — Encounter: Payer: Self-pay | Admitting: Family Medicine

## 2023-12-17 NOTE — Progress Notes (Signed)
Established Patient Office Visit   Subjective  Patient ID: Cathy [name], female    DOB: 1981-06-05  Age: 43 y.o. MRN: [id number]  Chief Complaint  Patient presents with   Acute Visit    Headaches/  Breathing concerns, not able to catch breath, not able to walk far w/ out feeling like she will have an asthma attack.  Vaginal discharge. X 3 wks.     She  has a past medical history of Anasarca associated with disorder of kidney (06/08/2021), Anxiety, Asthma, Bipolar depression (HCC), Depression, Dyspnea, ESRD (end stage renal disease) on dialysis (HCC) (10/2020), GERD (gastroesophageal reflux disease), Gonorrhea, Lupus, Pelvic inflammatory disease (PID), Pleural effusion (06/08/2021), Pulmonary embolism (HCC) (01/31/2022), Renal hypertension, and Trichomonas infection.  Patient reports recurrent headaches that occur intermittently and have been gradually worsening over time. The pain is localized to the frontal and occipital regions and radiates to the right and left sides of the neck. She describes the headache as aching, pulsating, and sharp, with a severity of 9/10. Associated symptoms include blurred vision, eye pain, neck pain, photophobia, and visual changes, which are aggravated by bright lights and noise. She has tried acetaminophen for relief, but it was ineffective. Her medical history is significant for hypertension, a personal history of migraine headaches, and a family history of migraines.    Review of Systems  Constitutional:  Negative for chills and fever.  Eyes:  Positive for blurred vision, photophobia and pain.  Cardiovascular:  Negative for chest pain.  Neurological:  Positive for dizziness and headaches.      Objective:     BP 139/88   Pulse (!) 113   Ht 5' 6.73" (1.695 m)   Wt 135 lb 1.3 oz (61.3 kg)   SpO2 98%   BMI 21.33 kg/m  BP Readings from Last 3 Encounters:  12/18/23 139/88  10/19/23 (!) 188/115  10/13/23 (!) 191/114      Physical  Exam Vitals reviewed.  Constitutional:      General: She is not in acute distress.    Appearance: Normal appearance. She is not ill-appearing, toxic-appearing or diaphoretic.  HENT:     Head: Normocephalic.  Eyes:     General:        Right eye: No discharge.        Left eye: No discharge.     Conjunctiva/sclera: Conjunctivae normal.  Cardiovascular:     Rate and Rhythm: Normal rate.     Pulses: Normal pulses.     Heart sounds: Normal heart sounds.  Pulmonary:     Effort: Pulmonary effort is normal. No respiratory distress.     Breath sounds: Normal breath sounds.  Musculoskeletal:     Cervical back: Normal range of motion.  Skin:    General: Skin is warm and dry.     Capillary Refill: Capillary refill takes less than 2 seconds.  Neurological:     Mental Status: She is alert.  Psychiatric:        Mood and Affect: Mood normal.        Behavior: Behavior normal.      No results found for any visits on 12/18/23.  The 10-year ASCVD risk score (Arnett DK, et al., 2019) is: 10.6%    Assessment & Plan:  Vaginal discharge Assessment & Plan: Urinalysis, NuSwab and urine culture ordered- Awaiting results will follow up. Discussed to make sure to urinate frequently and avoid holding your urine. After using the bathroom, always wipe from front to back to  prevent bacteria from spreading. Avoid irritants like caffeine, alcohol, spicy foods, and artificial sweeteners, as they can aggravate your bladder. Wearing loose, breathable clothing, especially cotton underwear, can help keep the area dry and reduce bacterial growth. If symptoms persist or worsen follow up.   Orders: -     Urinalysis -     NuSwab Vaginitis Plus (VG+) -     Urine Culture  Screening for cervical cancer -     Cytology - PAP  Cervical cancer screening -     Cytology - PAP  Generalized abdominal pain -     Urinalysis  Chronic migraine w/o aura w/o status migrainosus, not intractable Assessment &  Plan: Propranolol 10 mg PRN Advise methods include deep breathing, cognitive behavioral therapy focusing on changing thoughts, Limit or avoid alcohol, caffeine, chocolate, canner foods, MSG and aspartame.  Implement sleep hygiene includes not watching TV or listen music in bed, don't eat heavy meals within a couple of hours of bedtime, don't use your phone, laptop, or tablet at bedtime     Other orders -     Mometasone Furo-Formoterol Fum; Inhale 2 puffs into the lungs in the morning and at bedtime.  Dispense: 13 each; Refill: 2 -     busPIRone HCl; Take 1 tablet (30 mg total) by mouth in the morning and at bedtime.  Dispense: 60 tablet; Refill: 2 -     Albuterol Sulfate HFA; Inhale 2 puffs into the lungs every 6 (six) hours as needed for wheezing or shortness of breath.  Dispense: 8 g; Refill: 2 -     Propranolol HCl; Take 1 tablet (10 mg total) by mouth every 12 (twelve) hours as needed.  Dispense: 30 tablet; Refill: 2    Return in about 4 months (around 04/16/2024), or if symptoms worsen or fail to improve, for chronic follow-up.   Cruzita Lederer Newman Nip, FNP

## 2023-12-17 NOTE — Patient Instructions (Signed)

## 2023-12-18 ENCOUNTER — Encounter: Payer: Self-pay | Admitting: Hematology and Oncology

## 2023-12-18 ENCOUNTER — Ambulatory Visit: Payer: Medicare HMO | Admitting: Urology

## 2023-12-18 ENCOUNTER — Other Ambulatory Visit: Payer: Self-pay | Admitting: Family Medicine

## 2023-12-18 ENCOUNTER — Ambulatory Visit (INDEPENDENT_AMBULATORY_CARE_PROVIDER_SITE_OTHER): Payer: Medicare HMO | Admitting: Family Medicine

## 2023-12-18 ENCOUNTER — Encounter: Payer: Self-pay | Admitting: Family Medicine

## 2023-12-18 ENCOUNTER — Ambulatory Visit: Payer: Medicare HMO | Admitting: Professional Counselor

## 2023-12-18 VITALS — BP 139/88 | HR 113 | Ht 66.73 in | Wt 135.1 lb

## 2023-12-18 DIAGNOSIS — Z124 Encounter for screening for malignant neoplasm of cervix: Secondary | ICD-10-CM

## 2023-12-18 DIAGNOSIS — R1084 Generalized abdominal pain: Secondary | ICD-10-CM | POA: Diagnosis not present

## 2023-12-18 DIAGNOSIS — Z8659 Personal history of other mental and behavioral disorders: Secondary | ICD-10-CM

## 2023-12-18 DIAGNOSIS — N898 Other specified noninflammatory disorders of vagina: Secondary | ICD-10-CM | POA: Insufficient documentation

## 2023-12-18 DIAGNOSIS — G43709 Chronic migraine without aura, not intractable, without status migrainosus: Secondary | ICD-10-CM | POA: Diagnosis not present

## 2023-12-18 MED ORDER — MOMETASONE FURO-FORMOTEROL FUM 100-5 MCG/ACT IN AERO
2.0000 | INHALATION_SPRAY | Freq: Two times a day (BID) | RESPIRATORY_TRACT | 2 refills | Status: DC
Start: 2023-12-18 — End: 2023-12-18

## 2023-12-18 MED ORDER — TOPIRAMATE 50 MG PO TABS: 50.0000 mg | ORAL_TABLET | Freq: Every day | ORAL | 3 refills | Status: DC

## 2023-12-18 MED ORDER — BUSPIRONE HCL 30 MG PO TABS: 30.0000 mg | ORAL_TABLET | Freq: Two times a day (BID) | ORAL | 2 refills | Status: DC

## 2023-12-18 MED ORDER — PROPRANOLOL HCL 10 MG PO TABS: 10.0000 mg | ORAL_TABLET | Freq: Two times a day (BID) | ORAL | 2 refills | Status: DC | PRN

## 2023-12-18 MED ORDER — ALBUTEROL SULFATE HFA 108 (90 BASE) MCG/ACT IN AERS: 2.0000 | INHALATION_SPRAY | Freq: Four times a day (QID) | RESPIRATORY_TRACT | 2 refills | Status: DC | PRN

## 2023-12-18 NOTE — Telephone Encounter (Signed)
sent 
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
## 2023-12-18 NOTE — Assessment & Plan Note (Signed)
Propranolol 10 mg PRN Advise methods include deep breathing, cognitive behavioral therapy focusing on changing thoughts, Limit or avoid alcohol, caffeine, chocolate, canner foods, MSG and aspartame.  Implement sleep hygiene includes not watching TV or listen music in bed, don't eat heavy meals within a couple of hours of bedtime, don't use your phone, laptop, or tablet at bedtime

## 2023-12-18 NOTE — Assessment & Plan Note (Signed)
Urinalysis, NuSwab and urine culture ordered- Awaiting results will follow up. Discussed to make sure to urinate frequently and avoid holding your urine. After using the bathroom, always wipe from front to back to prevent bacteria from spreading. Avoid irritants like caffeine, alcohol, spicy foods, and artificial sweeteners, as they can aggravate your bladder. Wearing loose, breathable clothing, especially cotton underwear, can help keep the area dry and reduce bacterial growth. If symptoms persist or worsen follow up.

## 2023-12-20 ENCOUNTER — Other Ambulatory Visit: Payer: Self-pay | Admitting: Family Medicine

## 2023-12-20 LAB — URINE CULTURE: Organism ID, Bacteria: NO GROWTH

## 2023-12-21 LAB — URINALYSIS
Bilirubin, UA: NEGATIVE
Glucose, UA: NEGATIVE
Ketones, UA: NEGATIVE
Nitrite, UA: NEGATIVE
Specific Gravity, UA: 1.02 (ref 1.005–1.030)
Urobilinogen, Ur: 0.2 mg/dL (ref 0.2–1.0)
pH, UA: 8.5 — ABNORMAL HIGH (ref 5.0–7.5)

## 2023-12-22 ENCOUNTER — Other Ambulatory Visit: Payer: Self-pay | Admitting: Family Medicine

## 2023-12-22 LAB — NUSWAB VAGINITIS PLUS (VG+)
Atopobium vaginae: HIGH {score} — AB
Candida albicans, NAA: NEGATIVE
Candida glabrata, NAA: NEGATIVE

## 2023-12-22 MED ORDER — METRONIDAZOLE 500 MG PO TABS: 500.0000 mg | ORAL_TABLET | Freq: Two times a day (BID) | ORAL | 0 refills | Status: AC

## 2023-12-22 NOTE — Progress Notes (Signed)
Please inform the patient that their results came back positive for trichomoniasis. A prescription for metronidazole 500 mg, to be taken twice daily for 7 days, has been sent to the pharmacy. Ensure the patient understands the importance of completing the full course of medication and abstaining from sexual activity until treatment is finished and symptoms resolve

## 2023-12-23 ENCOUNTER — Telehealth (INDEPENDENT_AMBULATORY_CARE_PROVIDER_SITE_OTHER): Payer: Medicare HMO | Admitting: Professional Counselor

## 2023-12-23 DIAGNOSIS — Z8659 Personal history of other mental and behavioral disorders: Secondary | ICD-10-CM | POA: Diagnosis not present

## 2023-12-23 DIAGNOSIS — F32A Depression, unspecified: Secondary | ICD-10-CM | POA: Diagnosis not present
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
## 2023-12-28 ENCOUNTER — Encounter (HOSPITAL_COMMUNITY): Payer: Self-pay | Admitting: Emergency Medicine

## 2023-12-28 ENCOUNTER — Emergency Department (HOSPITAL_COMMUNITY)
Admission: EM | Admit: 2023-12-28 | Discharge: 2023-12-28 | Disposition: A | Discharge status: Home / Self Care | Payer: Medicare HMO | Attending: Emergency Medicine | Admitting: Emergency Medicine

## 2023-12-28 ENCOUNTER — Encounter: Payer: Self-pay | Admitting: Hematology and Oncology

## 2023-12-28 ENCOUNTER — Other Ambulatory Visit: Payer: Self-pay

## 2023-12-28 DIAGNOSIS — Z992 Dependence on renal dialysis: Secondary | ICD-10-CM | POA: Diagnosis not present

## 2023-12-28 DIAGNOSIS — K219 Gastro-esophageal reflux disease without esophagitis: Secondary | ICD-10-CM | POA: Insufficient documentation

## 2023-12-28 DIAGNOSIS — Z20822 Contact with and (suspected) exposure to covid-19: Secondary | ICD-10-CM | POA: Diagnosis present

## 2023-12-28 DIAGNOSIS — K7460 Unspecified cirrhosis of liver: Secondary | ICD-10-CM | POA: Diagnosis not present

## 2023-12-28 DIAGNOSIS — Z1152 Encounter for screening for COVID-19: Secondary | ICD-10-CM | POA: Diagnosis not present

## 2023-12-28 DIAGNOSIS — Z86711 Personal history of pulmonary embolism: Secondary | ICD-10-CM | POA: Insufficient documentation

## 2023-12-28 DIAGNOSIS — J45909 Unspecified asthma, uncomplicated: Secondary | ICD-10-CM | POA: Diagnosis not present

## 2023-12-28 DIAGNOSIS — N186 End stage renal disease: Secondary | ICD-10-CM | POA: Diagnosis not present

## 2023-12-28 DIAGNOSIS — Z79899 Other long term (current) drug therapy: Secondary | ICD-10-CM | POA: Insufficient documentation

## 2023-12-28 LAB — RESP PANEL BY RT-PCR (RSV, FLU A&B, COVID)  RVPGX2
Influenza A by PCR: NEGATIVE
Influenza B by PCR: NEGATIVE
Resp Syncytial Virus by PCR: NEGATIVE
SARS Coronavirus 2 by RT PCR: NEGATIVE

## 2023-12-28 MED ORDER — ACETAMINOPHEN 500 MG PO TABS: 500.0000 mg | ORAL_TABLET | Freq: Once | ORAL | Status: DC

## 2023-12-28 NOTE — ED Triage Notes (Addendum)
Error in documentation

## 2023-12-28 NOTE — ED Provider Notes (Signed)
[place] EMERGENCY DEPARTMENT AT [facility] Provider Note   CSN: [id number] Arrival date & time: 12/28/23  1131     History  Chief Complaint  Patient presents with   covid test    Cathy [name] is a 43 y.o. female.  Patient with history of ESRD on hemodialysis, asthma, GERD, lupus, PE, liver cirrhosis presents today requesting a COVID test.  She is completely asymptomatic, however her son is here sick with a cough and cold and therefore she wanted to be swabbed as well.  She has been compliant with her dialysis and has no complaints.  The history is provided by the patient. No language interpreter was used.       Home Medications Prior to Admission medications   Medication Sig Start Date End Date Taking? Authorizing Provider  albuterol (VENTOLIN HFA) 108 (90 Base) MCG/ACT inhaler Inhale 2 puffs into the lungs every 6 (six) hours as needed for wheezing or shortness of breath. 12/18/23   Del Nigel Berthold, FNP  amLODipine (NORVASC) 10 MG tablet Take 1 tablet (10 mg total) by mouth in the morning. 08/27/23   Del Nigel Berthold, FNP  amoxicillin-clavulanate (AUGMENTIN) 875-125 MG tablet Take 1 tablet by mouth every 12 (twelve) hours. 10/13/23   Gareth Eagle, PA-C  azithromycin (ZITHROMAX) 250 MG tablet Take 2 tablets on day 1, then 1 tablet daily on days 2 through 5 10/19/23   [name], Rainier, FNP  Blood Pressure Monitoring (OMRON 3 SERIES BP MONITOR) DEVI Use as directed 2 times a day 08/27/23   [name], Tenna Child, FNP  budesonide-formoterol (SYMBICORT) 160-4.5 MCG/ACT inhaler Inhale 2 puffs into the lungs 2 (two) times daily. 12/18/23   Del Nigel Berthold, FNP  busPIRone (BUSPAR) 30 MG tablet Take 1 tablet (30 mg total) by mouth in the morning and at bedtime. 12/18/23   Del Nigel Berthold, FNP  carvedilol (COREG) 25 MG tablet Take 1 tablet (25 mg total) by mouth 2 (two) times daily with a meal. 08/27/23   [name], Tenna Child,  FNP  cloNIDine (CATAPRES) 0.3 MG tablet Take 1 tablet (0.3 mg total) by mouth 3 (three) times daily. 08/27/23   Del Nigel Berthold, FNP  Doxercalciferol (HECTOROL IV)  03/19/23   [provider]  DULoxetine (CYMBALTA) 30 MG capsule Take 1 capsule (30 mg total) by mouth daily. 09/21/23   Del Nigel Berthold, FNP  hydrALAZINE (APRESOLINE) 100 MG tablet Take 1 tablet (100 mg total) by mouth 3 (three) times daily. 08/27/23   Del Nigel Berthold, FNP  HYDROcodone-acetaminophen (NORCO/VICODIN) 5-325 MG tablet Take 1 tablet by mouth every 8 (eight) hours as needed for severe pain (pain score 7-10). 10/13/23   Gareth Eagle, PA-C  ipratropium (ATROVENT HFA) 17 MCG/ACT inhaler Inhale 2 puffs into the lungs every 6 (six) hours as needed for wheezing. Patient not taking: Reported on 09/16/2023 12/17/21 03/23/23  Rai, Delene Ruffini, MD  losartan (COZAAR) 50 MG tablet Take 50 mg by mouth at bedtime. 01/27/23   [provider]  metroNIDAZOLE (FLAGYL) 500 MG tablet Take 1 tablet (500 mg total) by mouth 2 (two) times daily for 7 days. 12/22/23 12/29/23  Del Nigel Berthold, FNP  ondansetron (ZOFRAN) 4 MG tablet Take 1 tablet (4 mg total) by mouth every 8 (eight) hours as needed for nausea or vomiting. 09/30/23   Del Nigel Berthold, FNP  pantoprazole (PROTONIX) 40 MG tablet Take 1 tablet (40 mg  total) by mouth daily before breakfast. 08/27/23 10/26/23  Del Nigel Berthold, FNP  propranolol (INDERAL) 10 MG tablet TAKE 1 TABLET (10 MG TOTAL) BY MOUTH EVERY 12 (TWELVE) HOURS AS NEEDED. 12/21/23   Del Nigel Berthold, FNP  sevelamer carbonate (RENVELA) 800 MG tablet Take 800 mg by mouth 3 (three) times daily. 10/08/23   [provider]  topiramate (TOPAMAX) 50 MG tablet TAKE 1 TABLET BY MOUTH EVERY DAY 12/21/23   Del Nigel Berthold, FNP  fenofibrate 160 MG tablet Take 1 tablet (160 mg total) by mouth daily. 12/31/20 01/20/21  Regalado, Prentiss Bells, MD      Allergies     Dialyvite 800 [nephro-vite]    Review of Systems   Review of Systems  HENT:  Positive for congestion.   Respiratory:  Positive for cough.   All other systems reviewed and are negative.   Physical Exam Updated Vital Signs BP (!) 157/88 (BP Location: Right Arm)   Pulse (!) 50   Temp 98.2 F (36.8 C) (Oral)   Resp 16   Ht 5\' 7"  (1.702 m)   Wt 63 kg   SpO2 100%   BMI 21.77 kg/m  Physical Exam Vitals and nursing note reviewed.  Constitutional:      General: She is not in acute distress.    Appearance: Normal appearance. She is normal weight. She is not ill-appearing, toxic-appearing or diaphoretic.  HENT:     Head: Normocephalic and atraumatic.  Cardiovascular:     Rate and Rhythm: Normal rate.  Pulmonary:     Effort: Pulmonary effort is normal. No respiratory distress.  Musculoskeletal:        General: Normal range of motion.     Cervical back: Normal range of motion.  Skin:    General: Skin is warm and dry.  Neurological:     General: No focal deficit present.     Mental Status: She is alert.  Psychiatric:        Mood and Affect: Mood normal.        Behavior: Behavior normal.     ED Results / Procedures / Treatments   Labs (all labs ordered are listed, but only abnormal results are displayed) Labs Reviewed  RESP PANEL BY RT-PCR (RSV, FLU A&B, COVID)  [id number]    EKG None  Radiology No results found.  Procedures Procedures    Medications Ordered in ED Medications - No data to display  ED Course/ Medical Decision Making/ A&P                                 Medical Decision Making  Patient presents requesting a COVID test.  She is afebrile, nontoxic-appearing, and in no acute distress reassuring vital signs.  She is completely asymptomatic.  Her respiratory swab is negative.  Discussed same with patient.  Her son is positive for strep as well as the flu, advised her she will probably get sick with similar symptoms.  Supportive care recommended and  discussed. Evaluation and diagnostic testing in the emergency department does not suggest an emergent condition requiring admission or immediate intervention beyond what has been performed at this time.  Plan for discharge with close PCP follow-up.  Patient is understanding and amenable with plan, educated on red flag symptoms that would prompt immediate return.  Patient discharged in stable condition.  Final Clinical Impression(s) / ED Diagnoses Final diagnoses:  Lab test negative for  COVID-19 virus    Rx / DC Orders ED Discharge Orders     None     An After Visit Summary was printed and given to the patient.     Vear Clock 12/28/23 1435    Benjiman Core, MD 12/28/23 1517

## 2023-12-28 NOTE — ED Triage Notes (Signed)
Pt presents with Covid symptoms x 2 days.

## 2023-12-28 NOTE — Discharge Instructions (Signed)
As we discussed, your workup in the ER today was reassuring for acute findings.  Your swab was negative for COVID, flu, and RSV.  However, given that your son has the flu, you will likely contract this also.  If you do get symptoms you can reach out to your primary doctor who can prescribe Tamiflu.  Otherwise management is supportive care with over-the-counter medication, close primary care follow-up and return precautions.  Return if development of any new or worsening symptoms.

## 2023-12-30 NOTE — Progress Notes (Deleted)
 Name: Cathy [name] DOB: 11/27/1981 MRN: [id number]  History of Present Illness: Cathy [name] is a 43 y.o. female who presents today as a new patient at [facility] [place]. All available relevant medical records have been reviewed.  ***She is accompanied by ***. - GU History: 1. ESRD secondary to lupus nephritis; on hemodialysis.  Recent history:  > 07/05/2023: CT angio chest/abdomen/pelvis w/wo contrast showed no GU stones, masses, or hydronephrosis; bladder unremarkable.  > 10/05/2023: CT abdomen/pelvis w/o contrast showed severe right hydronephrosis. No stones or masses identified. Also had small-volume ascites throughout the abdomen and pelvis with mild anasarca.  > 10/19/2023:  - Seen by Hem/Onc (Dr. Davonna). Reported ongoing abdominal pain, rated as 7-8/10, which she describes as 'short pains' and 'like a hunger feeling, but you're not hungry.' This is associated with nausea. - Renal function at baseline (abnormal; creatinine 14.14, GFR 3).  > 12/18/2023:  - UA showed trace blood and 1+ leukocytes; negative for nitrites. Negative urine culture.  - Vaginal discharge tested positive for trichomoniasis; treated with Flagyl .  Today: She {Actions; denies-reports:120008} flank pain or abdominal pain. She {Actions; denies-reports:120008} fevers, nausea, or vomiting.  She is ***anuric / ***oliguric due to hemodialysis.  She {Actions; denies-reports:120008} increased urinary urgency, frequency, nocturia, dysuria, gross hematuria, hesitancy, straining to void, or sensations of incomplete emptying.  ***unclear why she developed severe right hydronephrosis between August & November 2024 ***Already has ESRD / on hemodialysis  ***Consulted with Dr. Sherrilee on 12/30/23. He advised diagnostic right ureteroscopy with retrograde pyelogram.   Fall Screening: Do you usually have a device to assist in your mobility? {yes/no:20286} ***cane / ***walker /  ***wheelchair  Medications: Current Outpatient Medications  Medication Sig Dispense Refill   albuterol  (VENTOLIN  HFA) 108 (90 Base) MCG/ACT inhaler Inhale 2 puffs into the lungs every 6 (six) hours as needed for wheezing or shortness of breath. 8 g 2   amLODipine  (NORVASC ) 10 MG tablet Take 1 tablet (10 mg total) by mouth in the morning. 30 tablet 2   amoxicillin -clavulanate (AUGMENTIN ) 875-125 MG tablet Take 1 tablet by mouth every 12 (twelve) hours. 14 tablet 0   azithromycin  (ZITHROMAX ) 250 MG tablet Take 2 tablets on day 1, then 1 tablet daily on days 2 through 5 6 tablet 0   Blood Pressure Monitoring (OMRON 3 SERIES BP MONITOR) DEVI Use as directed 2 times a day 3 kit 0   budesonide-formoterol  (SYMBICORT) 160-4.5 MCG/ACT inhaler Inhale 2 puffs into the lungs 2 (two) times daily. 10.2 g 3   busPIRone  (BUSPAR ) 30 MG tablet Take 1 tablet (30 mg total) by mouth in the morning and at bedtime. 60 tablet 2   carvedilol  (COREG ) 25 MG tablet Take 1 tablet (25 mg total) by mouth 2 (two) times daily with a meal. 60 tablet 1   cloNIDine  (CATAPRES ) 0.3 MG tablet Take 1 tablet (0.3 mg total) by mouth 3 (three) times daily. 90 tablet 2   Doxercalciferol  (HECTOROL  IV)      DULoxetine  (CYMBALTA ) 30 MG capsule Take 1 capsule (30 mg total) by mouth daily. 90 capsule 0   hydrALAZINE  (APRESOLINE ) 100 MG tablet Take 1 tablet (100 mg total) by mouth 3 (three) times daily. 90 tablet 1   HYDROcodone -acetaminophen  (NORCO/VICODIN) 5-325 MG tablet Take 1 tablet by mouth every 8 (eight) hours as needed for severe pain (pain score 7-10). 6 tablet 0   ipratropium (ATROVENT  HFA) 17 MCG/ACT inhaler Inhale 2 puffs into the lungs every 6 (six) hours as needed for  wheezing. (Patient not taking: Reported on 09/16/2023) 1 each 12   losartan  (COZAAR ) 50 MG tablet Take 50 mg by mouth at bedtime.     ondansetron  (ZOFRAN ) 4 MG tablet Take 1 tablet (4 mg total) by mouth every 8 (eight) hours as needed for nausea or vomiting. 12  tablet 0   pantoprazole  (PROTONIX ) 40 MG tablet Take 1 tablet (40 mg total) by mouth daily before breakfast. 30 tablet 1   propranolol  (INDERAL ) 10 MG tablet TAKE 1 TABLET (10 MG TOTAL) BY MOUTH EVERY 12 (TWELVE) HOURS AS NEEDED. 90 tablet 1   sevelamer  carbonate (RENVELA ) 800 MG tablet Take 800 mg by mouth 3 (three) times daily.     topiramate  (TOPAMAX ) 50 MG tablet TAKE 1 TABLET BY MOUTH EVERY DAY 90 tablet 1   No current facility-administered medications for this visit.    Allergies: Allergies  Allergen Reactions   Dialyvite 800 [Nephro-Vite] Nausea And Vomiting    Past Medical History:  Diagnosis Date   Anasarca associated with disorder of kidney 06/08/2021   Anxiety    Asthma    Bipolar depression (HCC)    Depression    Dyspnea    ESRD (end stage renal disease) on dialysis (HCC) 10/2020   GERD (gastroesophageal reflux disease)    Gonorrhea    Lupus    Pelvic inflammatory disease (PID)    Pleural effusion 06/08/2021   Pulmonary embolism (HCC) 01/31/2022   Renal hypertension    Trichomonas infection    Past Surgical History:  Procedure Laterality Date   AV FISTULA PLACEMENT Left 06/10/2022   Procedure: LEFT ARM ARTERIOVENOUS (AV) FISTULA CREATION;  Surgeon: Oris Krystal FALCON, MD;  Location: [facility];  Service: Vascular;  Laterality: Left;   BASCILIC VEIN TRANSPOSITION Left 03/24/2023   Procedure: LEFT ARM SECOND STAGE BASILIC VEIN TRANSPOSITION;  Surgeon: Oris Krystal FALCON, MD;  Location: [facility];  Service: Vascular;  Laterality: Left;   IR FLUORO GUIDE CV LINE RIGHT  11/30/2020   IR FLUORO GUIDE CV LINE RIGHT  06/13/2021   IR FLUORO GUIDE CV LINE RIGHT  02/01/2022   IR REMOVAL TUN CV CATH W/O FL  06/11/2021   IR REMOVAL TUN CV CATH W/O FL  01/29/2022   IR US  GUIDE VASC ACCESS RIGHT  11/30/2020   IR US  GUIDE VASC ACCESS RIGHT  06/13/2021   IR US  GUIDE VASC ACCESS RIGHT  02/03/2022   RENAL BIOPSY     TEE WITHOUT CARDIOVERSION N/A 06/13/2021   Procedure: TRANSESOPHAGEAL ECHOCARDIOGRAM  (TEE);  Surgeon: Delford Maude BROCKS, MD;  Location: [facility];  Service: Cardiovascular;  Laterality: N/A;   TEE WITHOUT CARDIOVERSION N/A 02/03/2022   Procedure: TRANSESOPHAGEAL ECHOCARDIOGRAM (TEE);  Surgeon: Tobb, Kardie, DO;  Location: [facility] ENDOSCOPY;  Service: Cardiovascular;  Laterality: N/A;   TUBAL LIGATION N/A 02/03/2019   Procedure: POST PARTUM TUBAL LIGATION;  Surgeon: Izell Harari, MD;  Location: [facility] LD ORS;  Service: Gynecology;  Laterality: N/A;   Family History  Problem Relation Age of Onset   Anxiety disorder Mother    Sleep apnea Father    Diabetes Maternal Grandmother    Hypertension Maternal Grandmother    Diabetes Maternal Grandfather    Hypertension Maternal Grandfather    Diabetes Paternal Grandmother    Hypertension Paternal Grandmother    Diabetes Paternal Grandfather    Hypertension Paternal Grandfather    Social History   Socioeconomic History   Marital status: Single    Spouse name: Not on file   Number of children:  Not on file   Years of education: Not on file   Highest education level: Not on file  Occupational History   Occupation: unemployed  Tobacco Use   Smoking status: Every Day    Current packs/day: 0.25    Average packs/day: 0.3 packs/day for 25.1 years (6.3 ttl pk-yrs)    Types: Cigarettes    Start date: 2000   Smokeless tobacco: Never   Tobacco comments:    1-2 cigarettes daily   Vaping Use   Vaping status: Never Used  Substance and Sexual Activity   Alcohol use: Not Currently   Drug use: Yes    Types: Marijuana    Comment: 3 times a week    Sexual activity: Not Currently    Birth control/protection: None  Other Topics Concern   Not on file  Social History Narrative   Not on file   Social Drivers of Health   Financial Resource Strain: Not on file  Food Insecurity: No Food Insecurity (10/26/2022)   Hunger Vital Sign    Worried About Running Out of Food in the Last Year: Never true    Ran Out of Food in the Last Year: Never  true  Recent Concern: Food Insecurity - Food Insecurity Present (08/09/2022)   Hunger Vital Sign    Worried About Programme Researcher, Broadcasting/film/video in the Last Year: Never true    Ran Out of Food in the Last Year: Often true  Transportation Needs: Unmet Transportation Needs (10/26/2022)   PRAPARE - Administrator, Civil Service (Medical): Yes    Lack of Transportation (Non-Medical): Yes  Physical Activity: Not on file  Stress: Not on file  Social Connections: Not on file  Intimate Partner Violence: Not At Risk (10/26/2022)   Humiliation, Afraid, Rape, and Kick questionnaire    Fear of Current or Ex-Partner: No    Emotionally Abused: No    Physically Abused: No    Sexually Abused: No    SUBJECTIVE  Review of Systems Constitutional: Patient denies any unintentional weight loss or change in strength lntegumentary: Patient denies any rashes or pruritus Cardiovascular: Patient denies chest pain or syncope Respiratory: Patient denies shortness of breath Gastrointestinal: Patient ***denies nausea, vomiting, constipation, or diarrhea Musculoskeletal: Patient denies muscle cramps or weakness Neurologic: Patient denies convulsions or seizures Allergic/Immunologic: Patient denies recent allergic reaction(s) Hematologic/Lymphatic: Patient denies bleeding tendencies Endocrine: Patient denies heat/cold intolerance  GU: As per HPI.  OBJECTIVE There were no vitals filed for this visit. There is no height or weight on file to calculate BMI.  Physical Examination Constitutional: No obvious distress; patient is non-toxic appearing  Cardiovascular: No visible lower extremity edema.  Respiratory: The patient does not have audible wheezing/stridor; respirations do not appear labored  Gastrointestinal: Abdomen non-distended Musculoskeletal: Normal ROM of UEs  Skin: No obvious rashes/open sores  Neurologic: CN 2-12 grossly intact Psychiatric: Answered questions appropriately with normal affect   Hematologic/Lymphatic/Immunologic: No obvious bruises or sites of spontaneous bleeding  UA: ***negative / *** WBC/hpf, *** RBC/hpf, *** bacteria ***Urine microscopy: ***negative / *** WBC/hpf, *** RBC/hpf, *** bacteria ***with no evidence of UTI ***with no evidence of microscopic hematuria ***otherwise unremarkable  PVR: *** ml  ASSESSMENT No diagnosis found. ***  We agreed to plan for follow up in *** months or sooner if needed. Patient verbalized understanding of and agreement with current plan. All questions were answered.  PLAN Advised the following: *** ***No follow-ups on file.  No orders of the defined types were placed in  this encounter.   It has been explained that the patient is to follow regularly with their PCP in addition to all other providers involved in their care and to follow instructions provided by these respective offices. Patient advised to contact urology clinic if any urologic-pertaining questions, concerns, new symptoms or problems arise in the interim period.  There are no Patient Instructions on file for this visit.  Electronically signed by:  Lauraine KYM Oz, MSN, FNP-C, CUNP 12/30/2023 8:58 AM

## 2024-01-01 ENCOUNTER — Telehealth: Payer: Self-pay | Admitting: Professional Counselor

## 2024-01-01 ENCOUNTER — Ambulatory Visit (INDEPENDENT_AMBULATORY_CARE_PROVIDER_SITE_OTHER): Payer: Medicare HMO | Admitting: Professional Counselor

## 2024-01-01 NOTE — Telephone Encounter (Signed)
The patient was contacted by a behavioral health counselor regarding her follow-up appointment for collaborative care. She was initially seen two weeks ago for an evaluation and had a follow-up scheduled. However, during the psychiatric consultation, it was determined that she is not appropriate for collaborative care and requires a higher level of care. The plan was to discuss this recommendation during her appointment today, but she canceled. As a result, a behavioral health counselor reached out via telephone to assess her status and discuss the psychiatric consultation findings.    The patient answered the call and reported that she was stable, denying any suicidal thoughts, plan, or intent. The behavioral health counselor reviewed the recommendations, advising her to seek services at [facility] for a timely psychiatric evaluation and medication management, given concerns about her mental health and alcohol use. She was encouraged to seek these services immediately. The patient expressed understanding of these recommendations and stated that she would follow through. At this time, she will be discharged from the collaborative care program.

## 2024-01-02 ENCOUNTER — Encounter: Payer: Self-pay | Admitting: Hematology and Oncology

## 2024-01-06 NOTE — Patient Instructions (Signed)
 If your symptoms worsen or you have thoughts of suicide/homicide, PLEASE SEEK IMMEDIATE MEDICAL ATTENTION.  You may always call:   National Suicide Hotline: 988 or 917-531-6113 [place] Crisis Line: 636-643-9342 Crisis Recovery in [place]: [phone]     These are available 24 hours a day, 7 days a week.

## 2024-01-07 ENCOUNTER — Ambulatory Visit: Payer: Medicare HMO | Admitting: Urology

## 2024-01-07 DIAGNOSIS — M3214 Glomerular disease in systemic lupus erythematosus: Secondary | ICD-10-CM

## 2024-01-07 DIAGNOSIS — R109 Unspecified abdominal pain: Secondary | ICD-10-CM

## 2024-01-07 DIAGNOSIS — N186 End stage renal disease: Secondary | ICD-10-CM

## 2024-01-07 DIAGNOSIS — N1330 Unspecified hydronephrosis: Secondary | ICD-10-CM

## 2024-01-08 ENCOUNTER — Ambulatory Visit: Payer: Medicare HMO | Admitting: Professional Counselor

## 2024-01-08 ENCOUNTER — Ambulatory Visit (HOSPITAL_COMMUNITY): Payer: Medicare HMO | Admitting: Psychiatry

## 2024-01-08 ENCOUNTER — Encounter: Payer: Self-pay | Admitting: Hematology and Oncology

## 2024-01-08 ENCOUNTER — Ambulatory Visit: Payer: Medicare HMO | Admitting: Family Medicine

## 2024-01-08 ENCOUNTER — Encounter (HOSPITAL_COMMUNITY): Payer: Self-pay

## 2024-01-11 ENCOUNTER — Other Ambulatory Visit: Payer: Self-pay | Admitting: Family Medicine

## 2024-01-15 ENCOUNTER — Encounter: Payer: Self-pay | Admitting: Hematology and Oncology

## 2024-01-15 ENCOUNTER — Other Ambulatory Visit: Payer: Self-pay

## 2024-01-15 DIAGNOSIS — Z720 Tobacco use: Secondary | ICD-10-CM

## 2024-01-15 DIAGNOSIS — M3214 Glomerular disease in systemic lupus erythematosus: Secondary | ICD-10-CM

## 2024-01-15 DIAGNOSIS — R7989 Other specified abnormal findings of blood chemistry: Secondary | ICD-10-CM

## 2024-01-15 DIAGNOSIS — M329 Systemic lupus erythematosus, unspecified: Secondary | ICD-10-CM

## 2024-01-18 ENCOUNTER — Inpatient Hospital Stay: Payer: Medicare HMO | Attending: Oncology

## 2024-01-18 DIAGNOSIS — D631 Anemia in chronic kidney disease: Secondary | ICD-10-CM | POA: Insufficient documentation

## 2024-01-18 DIAGNOSIS — N189 Chronic kidney disease, unspecified: Secondary | ICD-10-CM | POA: Insufficient documentation

## 2024-01-25 ENCOUNTER — Inpatient Hospital Stay: Payer: Medicare HMO | Admitting: Oncology

## 2024-01-25 ENCOUNTER — Inpatient Hospital Stay: Payer: Medicare HMO

## 2024-01-25 DIAGNOSIS — N189 Chronic kidney disease, unspecified: Secondary | ICD-10-CM | POA: Diagnosis present

## 2024-01-25 DIAGNOSIS — M3214 Glomerular disease in systemic lupus erythematosus: Secondary | ICD-10-CM

## 2024-01-25 DIAGNOSIS — Z720 Tobacco use: Secondary | ICD-10-CM

## 2024-01-25 DIAGNOSIS — R7989 Other specified abnormal findings of blood chemistry: Secondary | ICD-10-CM

## 2024-01-25 DIAGNOSIS — D631 Anemia in chronic kidney disease: Secondary | ICD-10-CM | POA: Diagnosis present

## 2024-01-25 DIAGNOSIS — M329 Systemic lupus erythematosus, unspecified: Secondary | ICD-10-CM

## 2024-01-25 LAB — COMPREHENSIVE METABOLIC PANEL
ALT: 13 U/L (ref 0–44)
AST: 16 U/L (ref 15–41)
Albumin: 3.1 g/dL — ABNORMAL LOW (ref 3.5–5.0)
Alkaline Phosphatase: 140 U/L — ABNORMAL HIGH (ref 38–126)
Anion gap: 16 — ABNORMAL HIGH (ref 5–15)
BUN: 61 mg/dL — ABNORMAL HIGH (ref 6–20)
CO2: 28 mmol/L (ref 22–32)
Calcium: 9.3 mg/dL (ref 8.9–10.3)
Chloride: 94 mmol/L — ABNORMAL LOW (ref 98–111)
Creatinine, Ser: 11.83 mg/dL — ABNORMAL HIGH (ref 0.44–1.00)
GFR, Estimated: 4 mL/min — ABNORMAL LOW (ref >60)
Glucose, Bld: 118 mg/dL — ABNORMAL HIGH (ref 70–99)
Potassium: 4.5 mmol/L (ref 3.5–5.1)
Sodium: 138 mmol/L (ref 135–145)
Total Bilirubin: 1 mg/dL (ref 0.0–1.2)
Total Protein: 6.6 g/dL (ref 6.5–8.1)

## 2024-01-25 LAB — CBC WITH DIFFERENTIAL/PLATELET
Abs Immature Granulocytes: 0.02 10*3/uL (ref 0.00–0.07)
Basophils Absolute: 0 10*3/uL (ref 0.0–0.1)
Basophils Relative: 1 %
Eosinophils Absolute: 0.1 10*3/uL (ref 0.0–0.5)
Eosinophils Relative: 4 %
HCT: 32.6 % — ABNORMAL LOW (ref 36.0–46.0)
Hemoglobin: 10.1 g/dL — ABNORMAL LOW (ref 12.0–15.0)
Immature Granulocytes: 1 %
Lymphocytes Relative: 13 %
Lymphs Abs: 0.5 10*3/uL — ABNORMAL LOW (ref 0.7–4.0)
MCH: 30.1 pg (ref 26.0–34.0)
MCHC: 31 g/dL (ref 30.0–36.0)
MCV: 97 fL (ref 80.0–100.0)
Monocytes Absolute: 0.3 10*3/uL (ref 0.1–1.0)
Monocytes Relative: 7 %
Neutro Abs: 2.5 10*3/uL (ref 1.7–7.7)
Neutrophils Relative %: 74 %
Platelets: 71 10*3/uL — ABNORMAL LOW (ref 150–400)
RBC: 3.36 MIL/uL — ABNORMAL LOW (ref 3.87–5.11)
RDW: 18.4 % — ABNORMAL HIGH (ref 11.5–15.5)
WBC: 3.4 10*3/uL — ABNORMAL LOW (ref 4.0–10.5)
nRBC: 0.6 % — ABNORMAL HIGH (ref 0.0–0.2)

## 2024-01-25 LAB — IRON AND TIBC
Iron: 62 ug/dL (ref 28–170)
Saturation Ratios: 23 % (ref 10.4–31.8)
TIBC: 268 ug/dL (ref 250–450)
UIBC: 206 ug/dL

## 2024-01-25 LAB — FERRITIN: Ferritin: 996 ng/mL — ABNORMAL HIGH (ref 11–307)

## 2024-01-27 NOTE — Progress Notes (Unsigned)
 Name: NALAYAH HITT DOB: 1981-11-12 MRN: [id number]  History of Present Illness: Ms. Garretson is a 43 y.o. female who presents today as a new patient at [facility]. All available relevant medical records have been reviewed.  - GU History: 1. ESRD secondary to lupus nephritis; on hemodialysis.  Recent history:  > 07/05/2023: CT angio chest/abdomen/pelvis w/wo contrast showed no GU stones, masses, or hydronephrosis; bladder unremarkable.  > 10/05/2023: CT abdomen/pelvis w/o contrast showed severe right hydronephrosis. No stones or masses identified. Also had small-volume ascites throughout the abdomen and pelvis with mild anasarca.  > 10/19/2023:  - Seen by Hem/Onc (Dr. Anders Simmonds). Reported "ongoing abdominal pain, rated as 7-8/10, which she describes as 'short pains' and 'like a hunger feeling, but you're not hungry.' This is associated with nausea." - Renal function at baseline (abnormal; creatinine 14.14, GFR 3).  > 12/18/2023:  - UA showed trace blood and 1+ leukocytes; negative for nitrites. Negative urine culture.  - Vaginal discharge tested positive for trichomoniasis; treated with Flagyl.  Today: She reports bilateral flank pain (R > L) and generalized abdominal pain with significant / progressive abdominal swelling x6 months. She is oliguric due to hemodialysis; voids 2-3x/day on average. Last urinated 2-3 hours ago; denies needing to urinate in office today. Denies increased urinary urgency, frequency, nocturia, dysuria, gross hematuria, hesitancy, straining to void, or sensations of incomplete emptying.   Reports significant rectal pressure, sensation of rectal bulge when standing, and recent hematochezia. States she had a paracentesis for her ascites in the past. Was referred to GI in November 2024 but appointment never scheduled.   She reports constant nausea and weakness, daily coughing spells with phlegm production, and night sweats.   Reports ongoing  vaginal discharge. Denies being established with a GYN provider. Denies being sexually active.   Fall Screening: Do you usually have a device to assist in your mobility? No   Medications: Current Outpatient Medications  Medication Sig Dispense Refill   albuterol (VENTOLIN HFA) 108 (90 Base) MCG/ACT inhaler Inhale 2 puffs into the lungs every 6 (six) hours as needed for wheezing or shortness of breath. 8 g 2   amLODipine (NORVASC) 10 MG tablet Take 1 tablet (10 mg total) by mouth in the morning. 30 tablet 2   Blood Pressure Monitoring (OMRON 3 SERIES BP MONITOR) DEVI Use as directed 2 times a day 3 kit 0   budesonide-formoterol (SYMBICORT) 160-4.5 MCG/ACT inhaler Inhale 2 puffs into the lungs 2 (two) times daily. 10.2 g 3   busPIRone (BUSPAR) 30 MG tablet TAKE 1 TABLET (30 MG TOTAL) BY MOUTH IN THE MORNING AND AT BEDTIME. 180 tablet 1   carvedilol (COREG) 25 MG tablet Take 1 tablet (25 mg total) by mouth 2 (two) times daily with a meal. 60 tablet 1   cloNIDine (CATAPRES) 0.3 MG tablet Take 1 tablet (0.3 mg total) by mouth 3 (three) times daily. 90 tablet 2   Doxercalciferol (HECTOROL IV)      DULoxetine (CYMBALTA) 30 MG capsule Take 1 capsule (30 mg total) by mouth daily. 90 capsule 0   hydrALAZINE (APRESOLINE) 100 MG tablet Take 1 tablet (100 mg total) by mouth 3 (three) times daily. 90 tablet 1   HYDROcodone-acetaminophen (NORCO/VICODIN) 5-325 MG tablet Take 1 tablet by mouth every 8 (eight) hours as needed for severe pain (pain score 7-10). 6 tablet 0   losartan (COZAAR) 50 MG tablet Take 50 mg by mouth at bedtime.     ondansetron (ZOFRAN) 4 MG  tablet Take 1 tablet (4 mg total) by mouth every 8 (eight) hours as needed for nausea or vomiting. 12 tablet 0   propranolol (INDERAL) 10 MG tablet TAKE 1 TABLET (10 MG TOTAL) BY MOUTH EVERY 12 (TWELVE) HOURS AS NEEDED. 90 tablet 1   sevelamer carbonate (RENVELA) 800 MG tablet Take 800 mg by mouth 3 (three) times daily.     topiramate (TOPAMAX) 50 MG  tablet TAKE 1 TABLET BY MOUTH EVERY DAY 90 tablet 1   amoxicillin-clavulanate (AUGMENTIN) 875-125 MG tablet Take 1 tablet by mouth every 12 (twelve) hours. (Patient not taking: Reported on 01/29/2024) 14 tablet 0   azithromycin (ZITHROMAX) 250 MG tablet Take 2 tablets on day 1, then 1 tablet daily on days 2 through 5 (Patient not taking: Reported on 01/29/2024) 6 tablet 0   ipratropium (ATROVENT HFA) 17 MCG/ACT inhaler Inhale 2 puffs into the lungs every 6 (six) hours as needed for wheezing. (Patient not taking: Reported on 09/16/2023) 1 each 12   pantoprazole (PROTONIX) 40 MG tablet Take 1 tablet (40 mg total) by mouth daily before breakfast. 30 tablet 1   No current facility-administered medications for this visit.    Allergies: Allergies  Allergen Reactions   Dialyvite 800 [Nephro-Vite] Nausea And Vomiting    Past Medical History:  Diagnosis Date   Anasarca associated with disorder of kidney 06/08/2021   Anxiety    Asthma    Bipolar depression (HCC)    Depression    Dyspnea    ESRD (end stage renal disease) on dialysis (HCC) 10/2020   GERD (gastroesophageal reflux disease)    Gonorrhea    Lupus    Pelvic inflammatory disease (PID)    Pleural effusion 06/08/2021   Pulmonary embolism (HCC) 01/31/2022   Renal hypertension    Trichomonas infection    Past Surgical History:  Procedure Laterality Date   AV FISTULA PLACEMENT Left 06/10/2022   Procedure: LEFT ARM ARTERIOVENOUS (AV) FISTULA CREATION;  Surgeon: Larina Earthly, MD;  Location: [facility];  Service: Vascular;  Laterality: Left;   BASCILIC VEIN TRANSPOSITION Left 03/24/2023   Procedure: LEFT ARM SECOND STAGE BASILIC VEIN TRANSPOSITION;  Surgeon: Larina Earthly, MD;  Location: [facility];  Service: Vascular;  Laterality: Left;   IR FLUORO GUIDE CV LINE RIGHT  11/30/2020   IR FLUORO GUIDE CV LINE RIGHT  06/13/2021   IR FLUORO GUIDE CV LINE RIGHT  02/01/2022   IR REMOVAL TUN CV CATH W/O FL  06/11/2021   IR REMOVAL TUN CV CATH W/O FL   01/29/2022   IR US GUIDE VASC ACCESS RIGHT  11/30/2020   IR US GUIDE VASC ACCESS RIGHT  06/13/2021   IR US GUIDE VASC ACCESS RIGHT  02/03/2022   RENAL BIOPSY     TEE WITHOUT CARDIOVERSION N/A 06/13/2021   Procedure: TRANSESOPHAGEAL ECHOCARDIOGRAM (TEE);  Surgeon: Wendall Stade, MD;  Location[facility];  Service: Cardiovascular;  Laterality: N/A;   TEE WITHOUT CARDIOVERSION N/A 02/03/2022   Procedure: TRANSESOPHAGEAL ECHOCARDIOGRAM (TEE);  Surgeon: Thomasene Ripple, DO;  Location: [facility] ENDOSCOPY;  Service: Cardiovascular;  Laterality: N/A;   TUBAL LIGATION N/A 02/03/2019   Procedure: POST PARTUM TUBAL LIGATION;  Surgeon: Tropic Bing, MD;  Location: [facility] LD ORS;  Service: Gynecology;  Laterality: N/A;   Family History  Problem Relation Age of Onset   Anxiety disorder Mother    Sleep apnea Father    Diabetes Maternal Grandmother    Hypertension Maternal Grandmother    Diabetes Maternal Grandfather  Hypertension Maternal Grandfather    Diabetes Paternal Grandmother    Hypertension Paternal Grandmother    Diabetes Paternal Grandfather    Hypertension Paternal Grandfather    Social History   Socioeconomic History   Marital status: Single    Spouse name: Not on file   Number of children: Not on file   Years of education: Not on file   Highest education level: Not on file  Occupational History   Occupation: unemployed  Tobacco Use   Smoking status: Every Day    Current packs/day: 0.25    Average packs/day: 0.3 packs/day for 25.2 years (6.3 ttl pk-yrs)    Types: Cigarettes    Start date: 2000   Smokeless tobacco: Never   Tobacco comments:    1-2 cigarettes daily   Vaping Use   Vaping status: Never Used  Substance and Sexual Activity   Alcohol use: Not Currently   Drug use: Yes    Types: Marijuana    Comment: 3 times a week    Sexual activity: Not Currently    Birth control/protection: None  Other Topics Concern   Not on file  Social History Narrative   Not on file   Social  Drivers of Health   Financial Resource Strain: Not on file  Food Insecurity: No Food Insecurity (10/26/2022)   Hunger Vital Sign    Worried About Running Out of Food in the Last Year: Never true    Ran Out of Food in the Last Year: Never true  Recent Concern: Food Insecurity - Food Insecurity Present (08/09/2022)   Hunger Vital Sign    Worried About Programme researcher, broadcasting/film/video in the Last Year: Never true    Ran Out of Food in the Last Year: Often true  Transportation Needs: Unmet Transportation Needs (10/26/2022)   PRAPARE - Administrator, Civil Service (Medical): Yes    Lack of Transportation (Non-Medical): Yes  Physical Activity: Not on file  Stress: Not on file  Social Connections: Not on file  Intimate Partner Violence: Not At Risk (10/26/2022)   Humiliation, Afraid, Rape, and Kick questionnaire    Fear of Current or Ex-Partner: No    Emotionally Abused: No    Physically Abused: No    Sexually Abused: No    SUBJECTIVE  Review of Systems Constitutional: Patient reports feeling unwell Cardiovascular: Patient denies chest pain or syncope Respiratory: Patient reports shortness of breath Gastrointestinal: As per HPI Musculoskeletal: Patient reports weakness Endocrine: Patient reports night sweats  GU: As per HPI.  OBJECTIVE Vitals:   01/29/24 0901  BP: (!) 170/100  Pulse: (!) 56  Temp: 97.7 F (36.5 C)   There is no height or weight on file to calculate BMI.  Physical Examination Constitutional: Disheveled appearance; appears uncomfortable  Cardiovascular: No visible lower extremity edema.  Respiratory: The patient does not have audible wheezing/stridor; respirations do not appear labored  Gastrointestinal: Abdomen significantly distended (appears gravid x30+ weeks) Musculoskeletal: Normal ROM of UEs  Skin: No obvious rashes/open sores  Neurologic: CN 2-12 grossly intact Psychiatric: Answered questions appropriately with normal affect   Hematologic/Lymphatic/Immunologic: No obvious bruises or sites of spontaneous bleeding  UA: Patient did not void in office today PVR: >850 ml (voided 2-3 hours prior; unclear if this is accurate PVR or skewed due to her significant ascites. She declined I&O cath for PVR check).  ASSESSMENT Hydronephrosis of right kidney - Plan: Urinalysis, Routine w reflex microscopic, BLADDER SCAN AMB NON-IMAGING  SLE (systemic lupus erythematosus related  syndrome) (HCC) - Plan: Urinalysis, Routine w reflex microscopic, BLADDER SCAN AMB NON-IMAGING, Ambulatory referral to Gastroenterology  Nephrotic syndrome with focal and segmental glomerular lesions - Plan: Urinalysis, Routine w reflex microscopic, BLADDER SCAN AMB NON-IMAGING  Lupus nephritis (HCC) - Plan: Urinalysis, Routine w reflex microscopic, BLADDER SCAN AMB NON-IMAGING  ESRD (end stage renal disease) (HCC) - Plan: Urinalysis, Routine w reflex microscopic, BLADDER SCAN AMB NON-IMAGING  Tobacco use - Plan: Urinalysis, Routine w reflex microscopic, BLADDER SCAN AMB NON-IMAGING  Pelvic adhesive disease - Plan: Urinalysis, Routine w reflex microscopic, BLADDER SCAN AMB NON-IMAGING, Ambulatory referral to Obstetrics / Gynecology  Vaginal discharge - Plan: Ambulatory referral to Obstetrics / Gynecology  Trichomoniasis - Plan: Ambulatory referral to Obstetrics / Gynecology  Cirrhosis of liver with ascites, unspecified hepatic cirrhosis type (HCC) - Plan: Ambulatory referral to Gastroenterology  Gastroesophageal reflux disease, unspecified whether esophagitis present - Plan: Ambulatory referral to Gastroenterology  Other ascites - Plan: Ambulatory referral to Gastroenterology  Abdominal pain, unspecified abdominal location - Plan: Ambulatory referral to Gastroenterology  Abnormal MRI, liver - Plan: Ambulatory referral to Gastroenterology  We discussed her multiple concerns today. From a urology standpoint, we discussed concern regarding unclear  cause for development of severe right hydronephrosis between August 2024 & November 2024. May be secondary to ureteral compression from her significant ascites. She also has a history of pelvic adhesive disease. Discussed recommendation for updated imaging to reassess. Offered to place order for RUS today, however she elected to proceed to the ER after today's visit due to her significant discomfort related to her abdominal distention / ascites so we agreed to hold off on outpatient imaging order since she will likely have a CT there. We discussed likely need to repeat paracentesis. Urgent referral placed to GI.   If right hydronephrosis persists, will plan for diagnostic right ureteroscopy with retrograde pyelogram as per Dr. Ronne Binning, however patient is noted to be a poor surgical candidate due to her complex medical comorbidities.   GYN referral placed for her ongoing vaginal discharge per patient request.  We agreed to plan for follow up in 4 weeks or sooner if needed. Patient verbalized understanding of and agreement with current plan. All questions were answered.  PLAN Advised the following: Patient to ER now. Continue hemodialysis. Urgent referral placed to GI.  GYN referral placed.  Return in about 4 weeks (around 02/26/2024).  Orders Placed This Encounter  Procedures   Urinalysis, Routine w reflex microscopic   Ambulatory referral to Obstetrics / Gynecology    Referral Priority:   Routine    Referral Type:   Consultation    Referral Reason:   Specialty Services Required    Requested Specialty:   Obstetrics and Gynecology    Number of Visits Requested:   1   Ambulatory referral to Gastroenterology    Referral Priority:   Urgent    Referral Type:   Consultation    Referral Reason:   Specialty Services Required    Number of Visits Requested:   1   BLADDER SCAN AMB NON-IMAGING   Total time spent caring for the patient today was over 45 minutes. This includes time spent on the date  of the visit reviewing the patient's chart before the visit, time spent during the visit, and time spent after the visit on documentation. Over 50% of that time was spent in face-to-face time with this patient for direct counseling. E&M based on time and complexity of medical decision making.  It has been explained  that the patient is to follow regularly with their PCP in addition to all other providers involved in their care and to follow instructions provided by these respective offices. Patient advised to contact urology clinic if any urologic-pertaining questions, concerns, new symptoms or problems arise in the interim period.  There are no Patient Instructions on file for this visit.  Electronically signed by:  Donnita Falls, MSN, FNP-C, CUNP 01/29/2024 11:52 AM

## 2024-01-29 ENCOUNTER — Ambulatory Visit: Payer: Medicare HMO | Admitting: Urology

## 2024-01-29 ENCOUNTER — Encounter: Payer: Self-pay | Admitting: Urology

## 2024-01-29 VITALS — BP 170/100 | HR 56 | Temp 97.7°F

## 2024-01-29 DIAGNOSIS — K7460 Unspecified cirrhosis of liver: Secondary | ICD-10-CM

## 2024-01-29 DIAGNOSIS — A599 Trichomoniasis, unspecified: Secondary | ICD-10-CM

## 2024-01-29 DIAGNOSIS — K219 Gastro-esophageal reflux disease without esophagitis: Secondary | ICD-10-CM

## 2024-01-29 DIAGNOSIS — M3214 Glomerular disease in systemic lupus erythematosus: Secondary | ICD-10-CM

## 2024-01-29 DIAGNOSIS — N736 Female pelvic peritoneal adhesions (postinfective): Secondary | ICD-10-CM

## 2024-01-29 DIAGNOSIS — R109 Unspecified abdominal pain: Secondary | ICD-10-CM

## 2024-01-29 DIAGNOSIS — M329 Systemic lupus erythematosus, unspecified: Secondary | ICD-10-CM

## 2024-01-29 DIAGNOSIS — N898 Other specified noninflammatory disorders of vagina: Secondary | ICD-10-CM

## 2024-01-29 DIAGNOSIS — N041 Nephrotic syndrome with focal and segmental glomerular lesions: Secondary | ICD-10-CM

## 2024-01-29 DIAGNOSIS — R188 Other ascites: Secondary | ICD-10-CM

## 2024-01-29 DIAGNOSIS — N186 End stage renal disease: Secondary | ICD-10-CM

## 2024-01-29 DIAGNOSIS — R932 Abnormal findings on diagnostic imaging of liver and biliary tract: Secondary | ICD-10-CM

## 2024-01-29 DIAGNOSIS — Z720 Tobacco use: Secondary | ICD-10-CM

## 2024-01-29 DIAGNOSIS — N1330 Unspecified hydronephrosis: Secondary | ICD-10-CM | POA: Diagnosis not present

## 2024-01-29 LAB — URINALYSIS, ROUTINE W REFLEX MICROSCOPIC
Bilirubin, UA: NEGATIVE
Glucose, UA: NEGATIVE
Ketones, UA: NEGATIVE
Leukocytes,UA: NEGATIVE
Nitrite, UA: NEGATIVE
Protein,UA: NEGATIVE
Specific Gravity, UA: 1.015 (ref 1.005–1.030)
Urobilinogen, Ur: 1 mg/dL (ref 0.2–1.0)
pH, UA: 6.5 (ref 5.0–7.5)

## 2024-01-29 LAB — MICROSCOPIC EXAMINATION: Bacteria, UA: NONE SEEN

## 2024-01-29 LAB — BLADDER SCAN AMB NON-IMAGING: Scan Result: >819

## 2024-02-01 ENCOUNTER — Emergency Department (HOSPITAL_COMMUNITY)

## 2024-02-01 ENCOUNTER — Telehealth: Payer: Self-pay | Admitting: *Deleted

## 2024-02-01 ENCOUNTER — Inpatient Hospital Stay (HOSPITAL_BASED_OUTPATIENT_CLINIC_OR_DEPARTMENT_OTHER): Payer: Medicare HMO | Admitting: Oncology

## 2024-02-01 ENCOUNTER — Encounter (HOSPITAL_COMMUNITY): Payer: Self-pay | Admitting: *Deleted

## 2024-02-01 ENCOUNTER — Emergency Department (HOSPITAL_COMMUNITY)
Admission: EM | Admit: 2024-02-01 | Discharge: 2024-02-02 | Disposition: A | Discharge status: Home / Self Care | Attending: Emergency Medicine | Admitting: Emergency Medicine

## 2024-02-01 ENCOUNTER — Other Ambulatory Visit: Payer: Self-pay

## 2024-02-01 VITALS — BP 189/100 | HR 57 | Temp 98.2°F | Resp 18 | Ht 67.0 in | Wt 143.0 lb

## 2024-02-01 DIAGNOSIS — R7989 Other specified abnormal findings of blood chemistry: Secondary | ICD-10-CM

## 2024-02-01 DIAGNOSIS — N189 Chronic kidney disease, unspecified: Secondary | ICD-10-CM | POA: Insufficient documentation

## 2024-02-01 DIAGNOSIS — Z992 Dependence on renal dialysis: Secondary | ICD-10-CM | POA: Insufficient documentation

## 2024-02-01 DIAGNOSIS — J90 Pleural effusion, not elsewhere classified: Secondary | ICD-10-CM | POA: Diagnosis not present

## 2024-02-01 DIAGNOSIS — J939 Pneumothorax, unspecified: Secondary | ICD-10-CM | POA: Diagnosis not present

## 2024-02-01 DIAGNOSIS — M329 Systemic lupus erythematosus, unspecified: Secondary | ICD-10-CM | POA: Insufficient documentation

## 2024-02-01 DIAGNOSIS — K7460 Unspecified cirrhosis of liver: Secondary | ICD-10-CM | POA: Diagnosis not present

## 2024-02-01 DIAGNOSIS — R188 Other ascites: Secondary | ICD-10-CM | POA: Insufficient documentation

## 2024-02-01 DIAGNOSIS — D631 Anemia in chronic kidney disease: Secondary | ICD-10-CM | POA: Insufficient documentation

## 2024-02-01 DIAGNOSIS — M3214 Glomerular disease in systemic lupus erythematosus: Secondary | ICD-10-CM | POA: Diagnosis not present

## 2024-02-01 DIAGNOSIS — K7031 Alcoholic cirrhosis of liver with ascites: Secondary | ICD-10-CM | POA: Diagnosis not present

## 2024-02-01 DIAGNOSIS — N186 End stage renal disease: Secondary | ICD-10-CM | POA: Diagnosis not present

## 2024-02-01 DIAGNOSIS — Z720 Tobacco use: Secondary | ICD-10-CM | POA: Diagnosis not present

## 2024-02-01 DIAGNOSIS — R918 Other nonspecific abnormal finding of lung field: Secondary | ICD-10-CM | POA: Diagnosis not present

## 2024-02-01 DIAGNOSIS — R160 Hepatomegaly, not elsewhere classified: Secondary | ICD-10-CM | POA: Diagnosis not present

## 2024-02-01 DIAGNOSIS — R109 Unspecified abdominal pain: Secondary | ICD-10-CM | POA: Diagnosis not present

## 2024-02-01 DIAGNOSIS — R0989 Other specified symptoms and signs involving the circulatory and respiratory systems: Secondary | ICD-10-CM | POA: Diagnosis not present

## 2024-02-01 LAB — COMPREHENSIVE METABOLIC PANEL
ALT: 13 U/L (ref 0–44)
AST: 16 U/L (ref 15–41)
Albumin: 3 g/dL — ABNORMAL LOW (ref 3.5–5.0)
Alkaline Phosphatase: 128 U/L — ABNORMAL HIGH (ref 38–126)
Anion gap: 18 — ABNORMAL HIGH (ref 5–15)
BUN: 65 mg/dL — ABNORMAL HIGH (ref 6–20)
CO2: 24 mmol/L (ref 22–32)
Calcium: 9.2 mg/dL (ref 8.9–10.3)
Chloride: 97 mmol/L — ABNORMAL LOW (ref 98–111)
Creatinine, Ser: 13.68 mg/dL — ABNORMAL HIGH (ref 0.44–1.00)
GFR, Estimated: 3 mL/min — ABNORMAL LOW (ref >60)
Glucose, Bld: 98 mg/dL (ref 70–99)
Potassium: 4.9 mmol/L (ref 3.5–5.1)
Sodium: 139 mmol/L (ref 135–145)
Total Bilirubin: 0.8 mg/dL (ref 0.0–1.2)
Total Protein: 6.3 g/dL — ABNORMAL LOW (ref 6.5–8.1)

## 2024-02-01 LAB — PROTIME-INR
INR: 1.2 (ref 0.8–1.2)
Prothrombin Time: 15 s (ref 11.4–15.2)

## 2024-02-01 LAB — CBC WITH DIFFERENTIAL/PLATELET
Abs Immature Granulocytes: 0.05 10*3/uL (ref 0.00–0.07)
Basophils Absolute: 0 10*3/uL (ref 0.0–0.1)
Basophils Relative: 1 %
Eosinophils Absolute: 0.1 10*3/uL (ref 0.0–0.5)
Eosinophils Relative: 2 %
HCT: 29.7 % — ABNORMAL LOW (ref 36.0–46.0)
Hemoglobin: 9.3 g/dL — ABNORMAL LOW (ref 12.0–15.0)
Immature Granulocytes: 1 %
Lymphocytes Relative: 11 %
Lymphs Abs: 0.4 10*3/uL — ABNORMAL LOW (ref 0.7–4.0)
MCH: 30 pg (ref 26.0–34.0)
MCHC: 31.3 g/dL (ref 30.0–36.0)
MCV: 95.8 fL (ref 80.0–100.0)
Monocytes Absolute: 0.3 10*3/uL (ref 0.1–1.0)
Monocytes Relative: 8 %
Neutro Abs: 3.1 10*3/uL (ref 1.7–7.7)
Neutrophils Relative %: 77 %
Platelets: 94 10*3/uL — ABNORMAL LOW (ref 150–400)
RBC: 3.1 MIL/uL — ABNORMAL LOW (ref 3.87–5.11)
RDW: 18.6 % — ABNORMAL HIGH (ref 11.5–15.5)
WBC: 4 10*3/uL (ref 4.0–10.5)
nRBC: 0.5 % — ABNORMAL HIGH (ref 0.0–0.2)

## 2024-02-01 LAB — GLUCOSE, PLEURAL OR PERITONEAL FLUID: Glucose, Fluid: 104 mg/dL

## 2024-02-01 LAB — LIPASE, BLOOD: Lipase: 34 U/L (ref 11–51)

## 2024-02-01 LAB — BODY FLUID CELL COUNT WITH DIFFERENTIAL
Eos, Fluid: 0 %
Lymphs, Fluid: 63 %
Monocyte-Macrophage-Serous Fluid: 35 % — ABNORMAL LOW (ref 50–90)
Neutrophil Count, Fluid: 2 % (ref 0–25)
Total Nucleated Cell Count, Fluid: 90 uL (ref 0–1000)

## 2024-02-01 LAB — GRAM STAIN: Gram Stain: NONE SEEN

## 2024-02-01 LAB — PROTEIN, PLEURAL OR PERITONEAL FLUID: Total protein, fluid: <3 g/dL

## 2024-02-01 MED ORDER — LIDOCAINE HCL (PF) 2 % IJ SOLN
10.0000 mL | Freq: Once | INTRAMUSCULAR | Status: AC
  Administered 2024-02-01: 10 mL

## 2024-02-01 MED ORDER — FENTANYL CITRATE PF 50 MCG/ML IJ SOSY
50.0000 ug | PREFILLED_SYRINGE | Freq: Once | INTRAMUSCULAR | Status: AC
  Administered 2024-02-01: 50 ug via INTRAVENOUS
  Filled 2024-02-01: qty 1

## 2024-02-01 MED ORDER — LIDOCAINE HCL (PF) 2 % IJ SOLN
INTRAMUSCULAR | Status: AC
  Filled 2024-02-01: qty 10

## 2024-02-01 NOTE — ED Notes (Signed)
 Pt unable to provide urine sample at this time

## 2024-02-01 NOTE — Assessment & Plan Note (Signed)
 Smoking approximately 3-4 cigarettes per day. -Encourage smoking cessation.

## 2024-02-01 NOTE — Progress Notes (Signed)
 Patient tolerated right sided paracentesis procedure well today and 350 mL of blood tinged clear ascites removed with labs collected and sent for processing. Patient verbalized understanding of post procedure instructions and transported back to ED room assignment via stretcher at this time with no acute distress noted.

## 2024-02-01 NOTE — ED Notes (Signed)
Pt taken to ultrasound at this time.

## 2024-02-01 NOTE — Progress Notes (Signed)
 [facility] HEMATOLOGY FOLLOW-UP VISIT  Cathy [name], Tenna Child, FNP  REASON FOR FOLLOW-UP: Elevated ferritin  ASSESSMENT & PLAN:  Patient is a 43 year old female with ESRD on dialysis and SLE following for elevated ferritin.   Elevated ferritin Likely secondary to ESRD, SLE and iatrogenic IV iron infusions during dialysis.  Levels currently below thousand. -Will recommend dialysis center to stop her IV iron infusions -Will continue to monitor levels.  Will reach out to primary care to add iron labs and ferritin frequent blood work.  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  On dialysis on T/T/S -Continue to follow with nephrology   Anemia due to chronic kidney disease Anemia likely secondary to chronic kidney disease.  Hemoglobin greater than 10.  Receiving low-dose ESA with dialysis. -Continue to monitor and will administer high-dose ESA in the clinic if hemoglobin drops below 10  Tobacco use Smoking approximately 3-4 cigarettes per day. -Encourage smoking cessation.  Return to clinic in 3 months.  The total time spent in the appointment was 20 minutes encounter with patients including review of chart and various tests results, discussions about plan of care and coordination of care plan   All questions were answered. The patient knows to call the clinic with any problems, questions or concerns. No barriers to learning was detected.  Cindie Crumbly, MD 3/3/20253:52 PM   SUMMARY OF HEMATOLOGIC HISTORY: Elevated ferritin likely secondary to ESRD and chronic inflammation from SLE -Current levels <1000   INTERVAL HISTORY: Cathy [name] 43 y.o. female is following for elevated ferritin levels.  Patient reports abdominal distention today and is scheduled for paracentesis later today.  Patient is currently undergoing dialysis at [facility] dialysis center in [place] on Tuesday, Thursday and Saturday.  She also  receives ESA and frequent IV iron infusions.  She has no other complaints today.  She actually stated that she feels better than her last visit.  Patient continues to smoke, consuming about a pack of cigarettes every 3 to 4 days.She reports that she is trying to cut back due to its impact on her breathing.  I have reviewed the past medical history, past surgical history, social history and family history with the patient   ALLERGIES:  is allergic to dialyvite 800 [nephro-vite].  MEDICATIONS:  Current Outpatient Medications  Medication Sig Dispense Refill   albuterol (VENTOLIN HFA) 108 (90 Base) MCG/ACT inhaler Inhale 2 puffs into the lungs every 6 (six) hours as needed for wheezing or shortness of breath. 8 g 2   amLODipine (NORVASC) 10 MG tablet Take 1 tablet (10 mg total) by mouth in the morning. 30 tablet 2   Blood Pressure Monitoring (OMRON 3 SERIES BP MONITOR) DEVI Use as directed 2 times a day 3 kit 0   budesonide-formoterol (SYMBICORT) 160-4.5 MCG/ACT inhaler Inhale 2 puffs into the lungs 2 (two) times daily. 10.2 g 3   busPIRone (BUSPAR) 30 MG tablet TAKE 1 TABLET (30 MG TOTAL) BY MOUTH IN THE MORNING AND AT BEDTIME. 180 tablet 1   carvedilol (COREG) 25 MG tablet Take 1 tablet (25 mg total) by mouth 2 (two) times daily with a meal. 60 tablet 1   cloNIDine (CATAPRES) 0.3 MG tablet Take 1 tablet (0.3 mg total) by mouth 3 (three) times daily. 90 tablet 2   hydrALAZINE (APRESOLINE) 100 MG tablet Take 1 tablet (100 mg total) by mouth 3 (three) times daily. 90 tablet 1   losartan (COZAAR) 50 MG tablet Take 50  mg by mouth at bedtime.     Methoxy PEG-Epoetin Beta (MIRCERA IJ) Mircera     ondansetron (ZOFRAN) 4 MG tablet Take 1 tablet (4 mg total) by mouth every 8 (eight) hours as needed for nausea or vomiting. 12 tablet 0   propranolol (INDERAL) 10 MG tablet TAKE 1 TABLET (10 MG TOTAL) BY MOUTH EVERY 12 (TWELVE) HOURS AS NEEDED. 90 tablet 1   sevelamer carbonate (RENVELA) 800 MG tablet Take  800 mg by mouth 3 (three) times daily.     topiramate (TOPAMAX) 50 MG tablet TAKE 1 TABLET BY MOUTH EVERY DAY 90 tablet 1   VELPHORO 500 MG chewable tablet Chew by mouth. (Patient not taking: Reported on 02/01/2024)     acetaminophen (TYLENOL) 500 MG tablet Take 1,000 mg by mouth every 6 (six) hours as needed.     pantoprazole (PROTONIX) 40 MG tablet Take 1 tablet (40 mg total) by mouth daily before breakfast. 30 tablet 1   No current facility-administered medications for this visit.     REVIEW OF SYSTEMS:   Constitutional: Denies fevers, chills or night sweats Eyes: Denies blurriness of vision Ears, nose, mouth, throat, and face: Denies mucositis or sore throat Respiratory: Denies cough, dyspnea or wheezes Cardiovascular: Denies palpitation, chest discomfort or lower extremity swelling Gastrointestinal:  Denies nausea, heartburn or change in bowel habits Skin: Denies abnormal skin rashes Lymphatics: Denies new lymphadenopathy or easy bruising Neurological:Denies numbness, tingling or new weaknesses Behavioral/Psych: Mood is stable, no new changes  All other systems were reviewed with the patient and are negative.  PHYSICAL EXAMINATION:   Vitals:   02/01/24 0931  BP: (!) 189/100  Pulse: (!) 57  Resp: 18  Temp: 98.2 F (36.8 C)  SpO2: 98%    GENERAL:alert, no distress and comfortable SKIN: skin color, texture, turgor are normal, no rashes or significant lesions LUNGS: clear to auscultation and percussion with normal breathing effort HEART: regular rate & rhythm and no murmurs and no lower extremity edema ABDOMEN:abdomen soft, non-tender and normal bowel sounds Musculoskeletal:no cyanosis of digits and no clubbing  NEURO: alert & oriented x 3 with fluent speech  LABORATORY DATA:  I have reviewed the data as listed  Lab Results  Component Value Date   WBC 4.0 02/01/2024   NEUTROABS 3.1 02/01/2024   HGB 9.3 (L) 02/01/2024   HCT 29.7 (L) 02/01/2024   MCV 95.8 02/01/2024    PLT 94 (L) 02/01/2024       Chemistry      Component Value Date/Time   NA 139 02/01/2024 1244   NA 136 12/10/2018 1142   K 4.9 02/01/2024 1244   CL 97 (L) 02/01/2024 1244   CO2 24 02/01/2024 1244   BUN 65 (H) 02/01/2024 1244   BUN 9 12/10/2018 1142   CREATININE 13.68 (H) 02/01/2024 1244      Component Value Date/Time   CALCIUM 9.2 02/01/2024 1244   ALKPHOS 128 (H) 02/01/2024 1244   AST 16 02/01/2024 1244   ALT 13 02/01/2024 1244   BILITOT 0.8 02/01/2024 1244   BILITOT <0.2 12/10/2018 1142      Latest Reference Range & Units 01/25/24 14:09  Iron 28 - 170 ug/dL 62  UIBC ug/dL 213  TIBC 086 - 578 ug/dL 469  Saturation Ratios 10.4 - 31.8 % 23  Ferritin 11 - 307 ng/mL 996 (H)  (H): Data is abnormally high

## 2024-02-01 NOTE — ED Provider Notes (Signed)
 [place] EMERGENCY DEPARTMENT AT [facility] Provider Note   CSN: [id number] Arrival date & time: 02/01/24  1051     History  Chief Complaint  Patient presents with   Abdominal Pain    Cathy [name] is a 43 y.o. female.  She has history of lupus, cirrhosis, ESRD on dialysis Tuesday Thursday Saturday, the last dialyzed on Friday due to missing her Thursday session last week.  She presents to the ER today for evaluation of abdominal distention gradually worsening over the past 6 months.  She states it feels like when she had to have a paracentesis about 2 years ago.  Is gotten worse over the past 2 months to now where she feels like she constantly has to have a bowel movement and feels short of breath due to the pressure in her abdomen.  She denies fever or chills, denies chest pain, no nausea or vomiting.  No urinary symptoms.   Abdominal Pain      Home Medications Prior to Admission medications   Medication Sig Start Date End Date Taking? Authorizing Provider  acetaminophen (TYLENOL) 500 MG tablet Take 1,000 mg by mouth every 6 (six) hours as needed.   Yes [provider]  albuterol (VENTOLIN HFA) 108 (90 Base) MCG/ACT inhaler Inhale 2 puffs into the lungs every 6 (six) hours as needed for wheezing or shortness of breath. 12/18/23  Yes [name], Tenna Child, FNP  amLODipine (NORVASC) 10 MG tablet Take 1 tablet (10 mg total) by mouth in the morning. 08/27/23  Yes [name], Blyn, FNP  budesonide-formoterol (SYMBICORT) 160-4.5 MCG/ACT inhaler Inhale 2 puffs into the lungs 2 (two) times daily. 12/18/23  Yes [name], Tenna Child, FNP  busPIRone (BUSPAR) 30 MG tablet TAKE 1 TABLET (30 MG TOTAL) BY MOUTH IN THE MORNING AND AT BEDTIME. 01/11/24  Yes [name], [place], FNP  carvedilol (COREG) 25 MG tablet Take 1 tablet (25 mg total) by mouth 2 (two) times daily with a meal. 08/27/23  Yes [name], Gregory, FNP  cloNIDine (CATAPRES) 0.3 MG tablet  Take 1 tablet (0.3 mg total) by mouth 3 (three) times daily. 08/27/23  Yes [name], Tenna Child, FNP  hydrALAZINE (APRESOLINE) 100 MG tablet Take 1 tablet (100 mg total) by mouth 3 (three) times daily. 08/27/23  Yes [name], Tenna Child, FNP  losartan (COZAAR) 50 MG tablet Take 50 mg by mouth at bedtime. 01/27/23  Yes [provider]  Methoxy PEG-Epoetin Beta (MIRCERA IJ) Mircera 01/12/24 01/10/25 Yes [provider]  ondansetron (ZOFRAN) 4 MG tablet Take 1 tablet (4 mg total) by mouth every 8 (eight) hours as needed for nausea or vomiting. 09/30/23  Yes [name], Tenna Child, FNP  pantoprazole (PROTONIX) 40 MG tablet Take 1 tablet (40 mg total) by mouth daily before breakfast. 08/27/23 02/01/24 Yes [name], Tenna Child, FNP  propranolol (INDERAL) 10 MG tablet TAKE 1 TABLET (10 MG TOTAL) BY MOUTH EVERY 12 (TWELVE) HOURS AS NEEDED. 12/21/23  Yes [name], Tenna Child, FNP  sevelamer carbonate (RENVELA) 800 MG tablet Take 800 mg by mouth 3 (three) times daily. 10/08/23  Yes [provider]  topiramate (TOPAMAX) 50 MG tablet TAKE 1 TABLET BY MOUTH EVERY DAY 12/21/23  Yes [name], [place], FNP  Blood Pressure Monitoring (OMRON 3 SERIES BP MONITOR) DEVI Use as directed 2 times a day 08/27/23   [name], Tenna Child, FNP  VELPHORO 500 MG chewable tablet Chew by mouth. Patient  not taking: Reported on 02/01/2024    [provider]  fenofibrate 160 MG tablet Take 1 tablet (160 mg total) by mouth daily. 12/31/20 01/20/21  Regalado, Prentiss Bells, MD      Allergies    Dialyvite 800 [nephro-vite]    Review of Systems   Review of Systems  Gastrointestinal:  Positive for abdominal pain.    Physical Exam Updated Vital Signs BP (!) 167/98   Pulse 65   Temp 98.4 F (36.9 C) (Oral)   Resp 18   Ht 5\' 7"  (1.702 m)   Wt 64.9 kg   SpO2 96%   BMI 22.40 kg/m  Physical Exam Vitals and nursing note reviewed.  Constitutional:      General: She is not in acute  distress.    Appearance: She is well-developed.  HENT:     Head: Normocephalic and atraumatic.  Eyes:     Conjunctiva/sclera: Conjunctivae normal.  Cardiovascular:     Rate and Rhythm: Normal rate and regular rhythm.     Heart sounds: No murmur heard. Pulmonary:     Effort: Pulmonary effort is normal. No respiratory distress.     Breath sounds: Normal breath sounds.  Abdominal:     General: There is distension.     Palpations: Abdomen is soft. There is fluid wave.     Tenderness: There is generalized abdominal tenderness. There is no guarding or rebound.  Musculoskeletal:        General: No swelling.     Cervical back: Neck supple.  Skin:    General: Skin is warm and dry.     Capillary Refill: Capillary refill takes less than 2 seconds.  Neurological:     Mental Status: She is alert.  Psychiatric:        Mood and Affect: Mood normal.     ED Results / Procedures / Treatments   Labs (all labs ordered are listed, but only abnormal results are displayed) Labs Reviewed  CBC WITH DIFFERENTIAL/PLATELET - Abnormal; Notable for the following components:      Result Value   RBC 3.10 (*)    Hemoglobin 9.3 (*)    HCT 29.7 (*)    RDW 18.6 (*)    Platelets 94 (*)    nRBC 0.5 (*)    Lymphs Abs 0.4 (*)    All other components within normal limits  COMPREHENSIVE METABOLIC PANEL - Abnormal; Notable for the following components:   Chloride 97 (*)    BUN 65 (*)    Creatinine, Ser 13.68 (*)    Total Protein 6.3 (*)    Albumin 3.0 (*)    Alkaline Phosphatase 128 (*)    GFR, Estimated 3 (*)    Anion gap 18 (*)    All other components within normal limits  BODY FLUID CELL COUNT WITH DIFFERENTIAL - Abnormal; Notable for the following components:   Color, Fluid ORANGE (*)    Appearance, Fluid HAZY (*)    Monocyte-Macrophage-Serous Fluid 35 (*)    All other components within normal limits  GRAM STAIN  CULTURE, BODY FLUID W GRAM STAIN -BOTTLE  LIPASE, BLOOD  PROTIME-INR  GLUCOSE,  PLEURAL OR PERITONEAL FLUID  PROTEIN, PLEURAL OR PERITONEAL FLUID  URINALYSIS, ROUTINE W REFLEX MICROSCOPIC  PREGNANCY, URINE  PATHOLOGIST SMEAR REVIEW    EKG None  Radiology DG Chest Portable 1 View Result Date: 02/01/2024 CLINICAL DATA:  Ascites and shortness of breath EXAM: PORTABLE CHEST 1 VIEW COMPARISON:  Chest radiograph dated 09/16/2023 FINDINGS: Patient is  rotated slightly to the right. Slightly low lung volumes. Minimal bilateral interstitial opacities. Trace bilateral pleural effusions. Pneumothorax. Similar enlarged cardiomediastinal silhouette. No acute osseous abnormality. IMPRESSION: 1. Minimal bilateral interstitial opacities, likely pulmonary edema. 2. Trace bilateral pleural effusions. 3. Similar cardiomegaly. Electronically Signed   By: Agustin Cree M.D.   On: 02/01/2024 14:41   US Paracentesis Result Date: 02/01/2024 INDICATION: 43 year old female referred for paracentesis EXAM: ULTRASOUND GUIDED  PARACENTESIS MEDICATIONS: None. COMPLICATIONS: None PROCEDURE: Informed written consent was obtained from the patient after a discussion of the risks, benefits and alternatives to treatment. A timeout was performed prior to the initiation of the procedure. Initial ultrasound scanning demonstrates a small amount of ascites within the right lower abdominal quadrant. The right lower abdomen was prepped and draped in the usual sterile fashion. 1% lidocaine was used for local anesthesia. Following this, a 8 Fr Safe-T-Centesis catheter was introduced. An ultrasound image was saved for documentation purposes. The paracentesis was performed. The catheter was removed and a dressing was applied. The patient tolerated the procedure well without immediate post procedural complication. FINDINGS: A total of approximately 350 cc of blood tinged thin fluid was removed. Samples were sent to the laboratory as requested by the clinical team. IMPRESSION: Status post ultrasound-guided paracentesis Signed, Yvone Neu.  Miachel Roux, RPVI [facility] Radiology Electronically Signed   By: Gilmer Mor D.O.   On: 02/01/2024 14:07    Procedures Procedures    Medications Ordered in ED Medications  fentaNYL (SUBLIMAZE) injection 50 mcg (50 mcg Intravenous Given 02/01/24 1321)  lidocaine HCl (PF) (XYLOCAINE) 2 % injection 10 mL (10 mLs Other Given by Other 02/01/24 1337)  fentaNYL (SUBLIMAZE) injection 50 mcg (50 mcg Intravenous Given 02/01/24 1544)    ED Course/ Medical Decision Making/ A&P                                 Medical Decision Making This patient presents to the ED for concern of abdominal distension, this involves an extensive number of treatment options, and is a complaint that carries with it a high risk of complications and morbidity.  The differential diagnosis includes ascites, bowel obstruction, ileus, CHF exacerbation, other   Co morbidities that complicate the patient evaluation :   lupus, ESRD on HD TTS,    Additional history obtained:  Additional history obtained from EMR External records from outside source obtained and reviewed including prior notes   Lab Tests:  I Ordered, and personally interpreted labs.  The pertinent results include: No leukocytosis, hemoglobin 9.3 slightly decreased from baseline, potassium normal, increased anion gap 18 likely due to the disease         Problem List / ED Course / Critical interventions / Medication management  Shortness of breath-feel secondary to ascites, paracentesis performed by radiology and patient is feeling much better but having pain at the site of the puncture, given fentanyl and improved but given that she only got about 350 cc drained we will get a CT abdomen pelvis as well for further evaluation. I ordered medication including fentanyl for pain  Reevaluation of the patient after these medicines showed that the patient improved I have reviewed the patients home  medicines and have made adjustments as needed  CT is pending, signed out to Sabra Heck PA-C    Amount and/or Complexity of Data Reviewed Labs: ordered. Radiology: ordered.  Risk Prescription drug management.  Final Clinical Impression(s) / ED Diagnoses Final diagnoses:  None    Rx / DC Orders ED Discharge Orders     None         Josem Kaufmann 02/01/24 1736    Terrilee Files, MD 02/02/24 1815

## 2024-02-01 NOTE — Patient Instructions (Signed)
 VISIT SUMMARY:  During today's visit, we discussed your abdominal distension, chronic kidney disease, lupus, and tobacco use. We reviewed your recent ultrasound results and dialysis treatment, and we have made plans to address your current symptoms and ongoing health needs.  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 It is often caused by lupus in your case. We will check with your dialysis center to see if you are receiving iron or medications to boost your red blood cell count. If you are getting iron, we may stop it due to high iron levels. If your hemoglobin drops below 10, we might consider giving you medications to help increase it.  -TOBACCO USE: Smoking can harm your lungs and overall health. You are currently smoking about a pack every 3-4 days but are trying to cut back. We encourage you to continue your efforts to reduce and eventually quit smoking.  -LUPUS: Lupus is an autoimmune disease that can cause inflammation and damage to various parts of the body. You have an appointment with a rheumatologist later this month, and we recommend you continue with this plan.  INSTRUCTIONS:  Please undergo the paracentesis procedure today as scheduled. Check with your dialysis center to confirm if you are receiving iron or erythropoiesis-stimulating agents. Follow up with your primary care provider to perform iron labs at your next visit. We will see you again in three months.

## 2024-02-01 NOTE — Telephone Encounter (Signed)
 Per Dr. Anders Simmonds, Fredrich Romans, RN at [facility] notified that Dr. Anders Simmonds will be taking over management of her iron infusions, as she has been referred to her for elevated Ferritin.  Verbalized understanding and future appointments for iron cancelled.

## 2024-02-01 NOTE — Assessment & Plan Note (Signed)
 ESRD secondary to lupus nephritis.  On dialysis on T/T/S -Continue to follow with nephrology

## 2024-02-01 NOTE — Assessment & Plan Note (Addendum)
 Likely secondary to ESRD, SLE and iatrogenic IV iron infusions during dialysis.  Levels currently below thousand. -Will recommend dialysis center to stop her IV iron infusions -Will continue to monitor levels.  Will reach out to primary care to add iron labs and ferritin frequent blood work.

## 2024-02-01 NOTE — ED Provider Notes (Signed)
 Received patient from previous provider.  See her note.  In short, patient presents to emergency department for worsening abdominal distention over the past 6 months.  History of paracentesis 2 years ago.  ED workup is significant for no leukocytosis, infectious appearing fluid from paracentesis.  Gram culture and pathology smear pending.  CT significant for moderate ascites.  No effusion on chest x-ray.  Provided patient with analgesia and GI follow-up to schedule paracentesis.  Discussed ED workup, disposition, return to emergency department cautions with patient expressed understand agrees with plan.  All questions answered to her satisfaction.  She agreeable discharge.  Discharge instructions provided on paperwork.  Dr. Rhae Hammock individually reviewed ED workup and agrees with plan.   Judithann Sheen, PA 02/02/24 0013    Durwin Glaze, MD 02/02/24 [phone]

## 2024-02-01 NOTE — ED Notes (Signed)
 Pt states that she cannot give a UA. Pt made aware that when she needs to use call light and let us know.

## 2024-02-01 NOTE — ED Triage Notes (Addendum)
 Pt c/o continued abdominal pain and swelling for months causing difficulty breathing. Pt reports she believes she has urinary retention. Reports hx of paracentesis. Abdomen is noted to be distended above umbilicus in triage. Pt on dialysis - last session on Friday, next session due tomorrow. Pt also reports nausea.

## 2024-02-01 NOTE — Assessment & Plan Note (Signed)
 Anemia likely secondary to chronic kidney disease.  Hemoglobin greater than 10.  Receiving low-dose ESA with dialysis. -Continue to monitor and will administer high-dose ESA in the clinic if hemoglobin drops below 10

## 2024-02-01 NOTE — Discharge Instructions (Addendum)
 Thank you for letting us evaluate you today. Your CT scan showed moderate ascites but fortunately it is not infectious and does not require emergent drainage. I have provided you with follow up to have abdomen drained with GI  Return to ED if worsening of symptoms

## 2024-02-02 DIAGNOSIS — Z992 Dependence on renal dialysis: Secondary | ICD-10-CM | POA: Diagnosis not present

## 2024-02-02 DIAGNOSIS — R188 Other ascites: Secondary | ICD-10-CM | POA: Diagnosis not present

## 2024-02-02 DIAGNOSIS — N2581 Secondary hyperparathyroidism of renal origin: Secondary | ICD-10-CM | POA: Diagnosis not present

## 2024-02-02 DIAGNOSIS — N186 End stage renal disease: Secondary | ICD-10-CM | POA: Diagnosis not present

## 2024-02-02 LAB — PATHOLOGIST SMEAR REVIEW

## 2024-02-02 MED ORDER — OXYCODONE-ACETAMINOPHEN 5-325 MG PO TABS: 1.0000 | ORAL_TABLET | Freq: Four times a day (QID) | ORAL | 0 refills | Status: DC | PRN

## 2024-02-02 NOTE — ED Notes (Signed)
 Prepack percocet given. Patient educated about not driving or performing other critical tasks (such as operating heavy machinery, caring for infant/toddler/child) due to sedative nature of narcotic medications received while in the ED.  Pt/caregiver verbalized understanding.

## 2024-02-03 MED FILL — Oxycodone w/ Acetaminophen Tab 5-325 MG: ORAL | Qty: 6 | Status: AC

## 2024-02-04 DIAGNOSIS — N2581 Secondary hyperparathyroidism of renal origin: Secondary | ICD-10-CM | POA: Diagnosis not present

## 2024-02-04 DIAGNOSIS — Z992 Dependence on renal dialysis: Secondary | ICD-10-CM | POA: Diagnosis not present

## 2024-02-04 DIAGNOSIS — N186 End stage renal disease: Secondary | ICD-10-CM | POA: Diagnosis not present

## 2024-02-06 LAB — CULTURE, BODY FLUID W GRAM STAIN -BOTTLE: Culture: NO GROWTH

## 2024-02-09 ENCOUNTER — Encounter: Payer: Medicare HMO | Admitting: Internal Medicine

## 2024-02-09 DIAGNOSIS — N2581 Secondary hyperparathyroidism of renal origin: Secondary | ICD-10-CM | POA: Diagnosis not present

## 2024-02-09 DIAGNOSIS — Z992 Dependence on renal dialysis: Secondary | ICD-10-CM | POA: Diagnosis not present

## 2024-02-09 DIAGNOSIS — N186 End stage renal disease: Secondary | ICD-10-CM | POA: Diagnosis not present

## 2024-02-11 DIAGNOSIS — Z992 Dependence on renal dialysis: Secondary | ICD-10-CM | POA: Diagnosis not present

## 2024-02-11 DIAGNOSIS — N186 End stage renal disease: Secondary | ICD-10-CM | POA: Diagnosis not present

## 2024-02-11 DIAGNOSIS — N2581 Secondary hyperparathyroidism of renal origin: Secondary | ICD-10-CM | POA: Diagnosis not present

## 2024-02-12 ENCOUNTER — Encounter: Admitting: Adult Health

## 2024-02-15 ENCOUNTER — Emergency Department (HOSPITAL_COMMUNITY)
Admission: EM | Admit: 2024-02-15 | Discharge: 2024-02-15 | Disposition: A | Discharge status: Home / Self Care | Attending: Emergency Medicine | Admitting: Emergency Medicine

## 2024-02-15 ENCOUNTER — Emergency Department (HOSPITAL_COMMUNITY)

## 2024-02-15 ENCOUNTER — Other Ambulatory Visit: Payer: Self-pay | Admitting: Family Medicine

## 2024-02-15 ENCOUNTER — Encounter (HOSPITAL_COMMUNITY): Payer: Self-pay | Admitting: *Deleted

## 2024-02-15 ENCOUNTER — Other Ambulatory Visit: Payer: Self-pay

## 2024-02-15 DIAGNOSIS — M79602 Pain in left arm: Secondary | ICD-10-CM | POA: Diagnosis present

## 2024-02-15 DIAGNOSIS — T82848A Pain from vascular prosthetic devices, implants and grafts, initial encounter: Secondary | ICD-10-CM | POA: Insufficient documentation

## 2024-02-15 DIAGNOSIS — N186 End stage renal disease: Secondary | ICD-10-CM | POA: Diagnosis not present

## 2024-02-15 DIAGNOSIS — Z992 Dependence on renal dialysis: Secondary | ICD-10-CM | POA: Insufficient documentation

## 2024-02-15 DIAGNOSIS — M79603 Pain in arm, unspecified: Secondary | ICD-10-CM | POA: Diagnosis not present

## 2024-02-15 DIAGNOSIS — I509 Heart failure, unspecified: Secondary | ICD-10-CM | POA: Insufficient documentation

## 2024-02-15 DIAGNOSIS — R2232 Localized swelling, mass and lump, left upper limb: Secondary | ICD-10-CM | POA: Diagnosis not present

## 2024-02-15 DIAGNOSIS — T82590A Other mechanical complication of surgically created arteriovenous fistula, initial encounter: Secondary | ICD-10-CM | POA: Diagnosis not present

## 2024-02-15 LAB — CBC WITH DIFFERENTIAL/PLATELET
Abs Immature Granulocytes: 0.05 10*3/uL (ref 0.00–0.07)
Basophils Absolute: 0 10*3/uL (ref 0.0–0.1)
Basophils Relative: 1 %
Eosinophils Absolute: 0.2 10*3/uL (ref 0.0–0.5)
Eosinophils Relative: 4 %
HCT: 31.2 % — ABNORMAL LOW (ref 36.0–46.0)
Hemoglobin: 9.6 g/dL — ABNORMAL LOW (ref 12.0–15.0)
Immature Granulocytes: 1 %
Lymphocytes Relative: 14 %
Lymphs Abs: 0.6 10*3/uL — ABNORMAL LOW (ref 0.7–4.0)
MCH: 30.7 pg (ref 26.0–34.0)
MCHC: 30.8 g/dL (ref 30.0–36.0)
MCV: 99.7 fL (ref 80.0–100.0)
Monocytes Absolute: 0.4 10*3/uL (ref 0.1–1.0)
Monocytes Relative: 10 %
Neutro Abs: 2.7 10*3/uL (ref 1.7–7.7)
Neutrophils Relative %: 70 %
Platelets: 106 10*3/uL — ABNORMAL LOW (ref 150–400)
RBC: 3.13 MIL/uL — ABNORMAL LOW (ref 3.87–5.11)
RDW: 19.4 % — ABNORMAL HIGH (ref 11.5–15.5)
WBC: 3.9 10*3/uL — ABNORMAL LOW (ref 4.0–10.5)
nRBC: 0 % (ref 0.0–0.2)

## 2024-02-15 LAB — BASIC METABOLIC PANEL
Anion gap: 18 — ABNORMAL HIGH (ref 5–15)
BUN: 83 mg/dL — ABNORMAL HIGH (ref 6–20)
CO2: 26 mmol/L (ref 22–32)
Calcium: 9.3 mg/dL (ref 8.9–10.3)
Chloride: 97 mmol/L — ABNORMAL LOW (ref 98–111)
Creatinine, Ser: 14.37 mg/dL — ABNORMAL HIGH (ref 0.44–1.00)
GFR, Estimated: 3 mL/min — ABNORMAL LOW (ref >60)
Glucose, Bld: 96 mg/dL (ref 70–99)
Potassium: 5.3 mmol/L — ABNORMAL HIGH (ref 3.5–5.1)
Sodium: 141 mmol/L (ref 135–145)

## 2024-02-15 MED ORDER — OXYCODONE-ACETAMINOPHEN 5-325 MG PO TABS: 1.0000 | ORAL_TABLET | Freq: Four times a day (QID) | ORAL | 0 refills | Status: DC | PRN

## 2024-02-15 MED ORDER — FENTANYL CITRATE PF 50 MCG/ML IJ SOSY
50.0000 ug | PREFILLED_SYRINGE | Freq: Once | INTRAMUSCULAR | Status: DC
  Filled 2024-02-15: qty 1

## 2024-02-15 MED ORDER — FENTANYL CITRATE PF 50 MCG/ML IJ SOSY
50.0000 ug | PREFILLED_SYRINGE | Freq: Once | INTRAMUSCULAR | Status: AC
  Administered 2024-02-15: 50 ug via INTRAMUSCULAR
  Filled 2024-02-15: qty 1

## 2024-02-15 MED ORDER — METHOCARBAMOL 500 MG PO TABS
500.0000 mg | ORAL_TABLET | Freq: Once | ORAL | Status: AC
  Administered 2024-02-15: 500 mg via ORAL
  Filled 2024-02-15: qty 1

## 2024-02-15 NOTE — ED Triage Notes (Signed)
 Pt states she went to dialysis on Thursday and there was a new person there doing her dialysis and she said when they accessed her fistula it started swelling, she told them it was hurting and she had swelling  She has significant swelling to left arm and states she can not straighten her arm out

## 2024-02-15 NOTE — ED Provider Notes (Signed)
 [place] EMERGENCY DEPARTMENT AT [facility] Provider Note   CSN: [id number] Arrival date & time: 02/15/24  0754     History  Chief Complaint  Patient presents with   Arm Swelling    Cathy [name] is a 43 y.o. female with a history of lupus nephritis, CHF, and ESRD who presents the ED today with the arm pain. Patient reports that she has been having pain and swelling to the left arm since dialysis 5 days ago. She has intermittent muscle spasms of the arm as well as intermittent numbness and tingling to the forearm. Denies fevers, redness to the arm, or warmth to touch. States she has limited ROM second to pain. She has been icing the arm to help with the pain and swelling without improvement. No additional complaints or concerns at this time.    Home Medications Prior to Admission medications   Medication Sig Start Date End Date Taking? Authorizing Provider  oxyCODONE-acetaminophen (PERCOCET/ROXICET) 5-325 MG tablet Take 1 tablet by mouth every 6 (six) hours as needed for up to 5 days for severe pain (pain score 7-10). 02/15/24 02/20/24 Yes Maxwell Marion, PA-C  acetaminophen (TYLENOL) 500 MG tablet Take 1,000 mg by mouth every 6 (six) hours as needed.    [provider]  albuterol (VENTOLIN HFA) 108 (90 Base) MCG/ACT inhaler Inhale 2 puffs into the lungs every 6 (six) hours as needed for wheezing or shortness of breath. 12/18/23   Del Nigel Berthold, FNP  amLODipine (NORVASC) 10 MG tablet Take 1 tablet (10 mg total) by mouth in the morning. 08/27/23   Del Nigel Berthold, FNP  Blood Pressure Monitoring (OMRON 3 SERIES BP MONITOR) DEVI Use as directed 2 times a day 08/27/23   [name], Tenna Child, FNP  budesonide-formoterol (SYMBICORT) 160-4.5 MCG/ACT inhaler Inhale 2 puffs into the lungs 2 (two) times daily. 12/18/23   [name], Tenna Child, FNP  busPIRone (BUSPAR) 30 MG tablet TAKE 1 TABLET (30 MG TOTAL) BY MOUTH IN THE MORNING AND AT BEDTIME. 01/11/24    [name], Tenna Child, FNP  carvedilol (COREG) 25 MG tablet Take 1 tablet (25 mg total) by mouth 2 (two) times daily with a meal. 08/27/23   [name], Tenna Child, FNP  cloNIDine (CATAPRES) 0.3 MG tablet Take 1 tablet (0.3 mg total) by mouth 3 (three) times daily. 08/27/23   Del Nigel Berthold, FNP  hydrALAZINE (APRESOLINE) 100 MG tablet Take 1 tablet (100 mg total) by mouth 3 (three) times daily. 08/27/23   Del Nigel Berthold, FNP  losartan (COZAAR) 50 MG tablet Take 50 mg by mouth at bedtime. 01/27/23   [provider]  Methoxy PEG-Epoetin Beta (MIRCERA IJ) Mircera 01/12/24 01/10/25  [provider]  ondansetron (ZOFRAN) 4 MG tablet Take 1 tablet (4 mg total) by mouth every 8 (eight) hours as needed for nausea or vomiting. 09/30/23   [name], Tenna Child, FNP  pantoprazole (PROTONIX) 40 MG tablet Take 1 tablet (40 mg total) by mouth daily before breakfast. 08/27/23 02/01/24  Del Nigel Berthold, FNP  propranolol (INDERAL) 10 MG tablet TAKE 1 TABLET BY MOUTH EVERY 12 HOURS AS NEEDED. 02/15/24   Del Nigel Berthold, FNP  sevelamer carbonate (RENVELA) 800 MG tablet Take 800 mg by mouth 3 (three) times daily. 10/08/23   [provider]  topiramate (TOPAMAX) 50 MG tablet TAKE 1 TABLET BY MOUTH EVERY DAY 12/21/23   Del Nigel Berthold, FNP  VELPHORO 500 MG  chewable tablet Chew by mouth. Patient not taking: Reported on 02/01/2024    [provider]  fenofibrate 160 MG tablet Take 1 tablet (160 mg total) by mouth daily. 12/31/20 01/20/21  Regalado, Prentiss Bells, MD      Allergies    Dialyvite 800 [nephro-vite]    Review of Systems   Review of Systems  Musculoskeletal:        Arm pain  All other systems reviewed and are negative.   Physical Exam Updated Vital Signs BP (!) 159/66 (BP Location: Right Arm)   Pulse (!) 52   Temp 98.1 F (36.7 C) (Oral)   Resp 18   Ht 5\' 7"  (1.702 m)   Wt 64.9 kg   SpO2 96%   BMI 22.40 kg/m  Physical  Exam Vitals and nursing note reviewed.  Constitutional:      General: She is not in acute distress.    Appearance: Normal appearance.  HENT:     Head: Normocephalic and atraumatic.     Mouth/Throat:     Mouth: Mucous membranes are moist.  Eyes:     Conjunctiva/sclera: Conjunctivae normal.     Pupils: Pupils are equal, round, and reactive to light.  Cardiovascular:     Rate and Rhythm: Normal rate and regular rhythm.     Pulses: Normal pulses.     Heart sounds: Normal heart sounds.  Pulmonary:     Effort: Pulmonary effort is normal.     Breath sounds: Normal breath sounds.  Abdominal:     Palpations: Abdomen is soft.     Tenderness: There is no abdominal tenderness.  Musculoskeletal:        General: Swelling and tenderness present.     Comments: Tenderness to palpation of medial left biceps with swelling present. No warmth to touch or erythema. Palpable radial pulse. ROM of left upper extremity appreciated on exam. No tenderness to the left hand, elbow, or shoulder.  Skin:    General: Skin is warm and dry.     Findings: No rash.  Neurological:     General: No focal deficit present.     Mental Status: She is alert.  Psychiatric:        Mood and Affect: Mood normal.        Behavior: Behavior normal.      ED Results / Procedures / Treatments   Labs (all labs ordered are listed, but only abnormal results are displayed) Labs Reviewed  CBC WITH DIFFERENTIAL/PLATELET - Abnormal; Notable for the following components:      Result Value   WBC 3.9 (*)    RBC 3.13 (*)    Hemoglobin 9.6 (*)    HCT 31.2 (*)    RDW 19.4 (*)    Platelets 106 (*)    Lymphs Abs 0.6 (*)    All other components within normal limits  BASIC METABOLIC PANEL - Abnormal; Notable for the following components:   Potassium 5.3 (*)    Chloride 97 (*)    BUN 83 (*)    Creatinine, Ser 14.37 (*)    GFR, Estimated 3 (*)    Anion gap 18 (*)    All other components within normal limits     EKG None  Radiology US Venous Img Upper Uni Left Result Date: 02/15/2024 CLINICAL DATA:  Right arm pain after dialysis 4 days ago. EXAM: Left UPPER EXTREMITY VENOUS DOPPLER ULTRASOUND TECHNIQUE: Gray-scale sonography with graded compression, as well as color Doppler and duplex ultrasound were performed to  evaluate the upper extremity deep venous system from the level of the subclavian vein and including the jugular, axillary, basilic, radial, ulnar and upper cephalic vein. Spectral Doppler was utilized to evaluate flow at rest and with distal augmentation maneuvers. COMPARISON:  None Available. FINDINGS: Contralateral Subclavian Vein: Respiratory phasicity is normal and symmetric with the symptomatic side. No evidence of thrombus. Normal compressibility. Of note there is some slow flow in the right internal jugular vein. Internal Jugular Vein: No evidence of thrombus. Normal compressibility, respiratory phasicity and response to augmentation. Subclavian Vein: No evidence of thrombus. Normal compressibility, respiratory phasicity and response to augmentation. Axillary Vein: No evidence of thrombus. Normal compressibility, respiratory phasicity and response to augmentation. Cephalic Vein: No evidence of thrombus. Normal compressibility, respiratory phasicity and response to augmentation. Basilic Vein: No evidence of thrombus. Normal compressibility, respiratory phasicity and response to augmentation. Brachial Veins: No evidence of thrombus. Normal compressibility, respiratory phasicity and response to augmentation. Radial Veins: No evidence of thrombus. Normal compressibility, respiratory phasicity and response to augmentation. Ulnar Veins: No evidence of thrombus. Normal compressibility, respiratory phasicity and response to augmentation. Venous Reflux:  None visualized. Other Findings: Dialysis fistula the is noted and essentially patent at the arterial anastomosis. The dialysis fistula was not  interrogated in detail today. IMPRESSION: No evidence of left upper extremity DVT. Electronically Signed   By: Karen Kays M.D.   On: 02/15/2024 10:00    Procedures Procedures: not indicated.   Medications Ordered in ED Medications  fentaNYL (SUBLIMAZE) injection 50 mcg (50 mcg Intramuscular Given 02/15/24 0908)  methocarbamol (ROBAXIN) tablet 500 mg (500 mg Oral Given 02/15/24 1129)    ED Course/ Medical Decision Making/ A&P                                 Medical Decision Making Amount and/or Complexity of Data Reviewed Labs: ordered.  Risk Prescription drug management.   This patient presents to the ED for concern of arm pain at fistula site, this involves an extensive number of treatment options, and is a complaint that carries with it a high risk of complications and morbidity.   Differential diagnosis includes: cellulitis, phlebitis, thrombosis, occlusion, etc.   Comorbidities  See HPI above   Additional History  Additional history obtained from prior records   Lab Tests  I ordered and personally interpreted labs.  The pertinent results include:   BMP and CBC within normal limits for patient   Imaging Studies  I ordered imaging studies including left upper extremity ultrasound  I independently visualized and interpreted imaging which showed:  Fistula and anastomosis are patent.  No signs of thrombosis. I agree with the radiologist interpretation   Consultations  I requested consultation with Dr. Thedore Mins with nephrology,  and discussed lab and imaging findings as well as pertinent plan - they recommend: he came down and evaluated patient himself. Advised to reach out to vascular. She most likely has an access issue. If her labs are okay she can wait until tomorrow for HD. I requested consultation with Dr. Karin Lieu with vascular surgery,  and discussed lab and imaging findings as well as pertinent plan - they recommend: wrap arm with ACE wrap. They will  schedule her an appointment for this week.   Problem List / ED Course / Critical Interventions / Medication Management  Pain and swelling to left upper extremity since dialysis 5 days ago. She has been icing her arm without  improvement of symptoms. No clinical signs of cellulitis. I ordered medications including: Fentanyl for pain  Reevaluation of the patient after these medicines showed that the patient improved. Per nephrology, Dr. Thedore Mins, patient is stable for HD tomorrow. I have reviewed the patients home medicines and have made adjustments as needed. Patient given ACE wrap per Dr. Karin Lieu advise and given sling for comfort prior to discharge.   Social Determinants of Health  Tobacco use   Test / Admission - Considered  Patient is stable and safe for discharge home. Return precautions given.        Final Clinical Impression(s) / ED Diagnoses Final diagnoses:  Pain from A/V fistula [facility])    Rx / DC Orders ED Discharge Orders          Ordered    oxyCODONE-acetaminophen (PERCOCET/ROXICET) 5-325 MG tablet  Every 6 hours PRN        02/15/24 1117              Maxwell Marion, PA-C 02/15/24 1957    Rondel Baton, MD 02/19/24 1143

## 2024-02-15 NOTE — Discharge Instructions (Addendum)
 As discussed, your imaging of your arm was reassuring as well as her labs.  Continue with dialysis tomorrow.  Per vascular, wear an Ace wrap at the arm to help with swelling.  You will receive a call from the vascular office in the next several days to go into their office for reevaluation.  I have a prescription for Percocet to your pharmacy. Take this every 6 hours for pain as needed.  Do not drive for her right hip and machine you will take this medication as it can cause drowsiness.  Get help right away if: You have bleeding at your access that can't be easily stopped. Your access is suddenly hot, swollen, red, and very painful. Blood isn't flowing through the graft or fistula.

## 2024-02-16 DIAGNOSIS — N186 End stage renal disease: Secondary | ICD-10-CM | POA: Diagnosis not present

## 2024-02-16 DIAGNOSIS — Z992 Dependence on renal dialysis: Secondary | ICD-10-CM | POA: Diagnosis not present

## 2024-02-16 DIAGNOSIS — N2581 Secondary hyperparathyroidism of renal origin: Secondary | ICD-10-CM | POA: Diagnosis not present

## 2024-02-17 ENCOUNTER — Encounter: Payer: Self-pay | Admitting: Internal Medicine

## 2024-02-17 ENCOUNTER — Ambulatory Visit: Admitting: Internal Medicine

## 2024-02-18 ENCOUNTER — Ambulatory Visit: Admitting: Obstetrics and Gynecology

## 2024-02-18 ENCOUNTER — Encounter: Payer: Self-pay | Admitting: Obstetrics and Gynecology

## 2024-02-18 ENCOUNTER — Other Ambulatory Visit (HOSPITAL_COMMUNITY)
Admission: RE | Admit: 2024-02-18 | Discharge: 2024-02-18 | Disposition: A | Discharge status: Home / Self Care | Source: Ambulatory Visit | Attending: Obstetrics and Gynecology | Admitting: Obstetrics and Gynecology

## 2024-02-18 VITALS — BP 178/95 | HR 60 | Ht 67.0 in | Wt 147.0 lb

## 2024-02-18 DIAGNOSIS — N898 Other specified noninflammatory disorders of vagina: Secondary | ICD-10-CM | POA: Diagnosis not present

## 2024-02-18 DIAGNOSIS — Z7689 Persons encountering health services in other specified circumstances: Secondary | ICD-10-CM | POA: Diagnosis not present

## 2024-02-18 DIAGNOSIS — Z124 Encounter for screening for malignant neoplasm of cervix: Secondary | ICD-10-CM

## 2024-02-18 DIAGNOSIS — Z113 Encounter for screening for infections with a predominantly sexual mode of transmission: Secondary | ICD-10-CM | POA: Diagnosis not present

## 2024-02-18 DIAGNOSIS — Z1151 Encounter for screening for human papillomavirus (HPV): Secondary | ICD-10-CM | POA: Insufficient documentation

## 2024-02-18 NOTE — Progress Notes (Signed)
   GYNECOLOGY PROGRESS NOTE  History:  43 y.o. [id number] presents to [facility] establish care, hx CHF and ESRD  Having vaginal discharge, odor, reports some itching and irritation randomly throughout the day.   The following portions of the patient's history were reviewed and updated as appropriate: allergies, current medications, past family history, past medical history, past social history, past surgical history and problem list. Last pap smear around   Health Maintenance Due  Topic Date Due   Medicare Annual Wellness (AWV)  Never done   Cervical Cancer Screening (HPV/Pap Cotest)  Never done   COVID-19 Vaccine (1 - 2024-25 season) Never done     Review of Systems:  Pertinent items are noted in HPI.   Objective:  Physical Exam Blood pressure (!) 178/95, pulse 60, height 5\' 7"  (1.702 m), weight 147 lb (66.7 kg), unknown if currently breastfeeding. VS reviewed, nursing note reviewed,  Constitutional: well developed, well nourished, no distress HEENT: normocephalic CV: normal rate Pulm/chest wall: normal effort Breast Exam: deferred Neuro: alert and oriented Skin: warm, dry Psych: affect normal Pelvic exam: Pelvic: normal appearing vulva with no masses, tenderness or lesions  VAGINA: normal appearing vagina with normal color and creamy white discharge, no lesions  CERVIX: normal appearing cervix without discharge or lesions, no CMT  Thin prep pap is done w HR HPV cotesting  UTERUS: uterus is felt to be normal size, shape, consistency and nontender   ADNEXA: No adnexal masses or tenderness noted.    Assessment & Plan:  1. Vaginal discharge Swab collected, will follow up with results - Cervicovaginal ancillary only( [place]) - RPR+HBsAg+HCVAb+...  2. Cervical cancer screening  - Cytology - PAP( [place])  3. Encounter to establish care (Primary)  - MM 3D SCREENING MAMMOGRAM BILATERAL BREAST; Future  4. Screen for STD (sexually transmitted disease)  -  RPR+HBsAg+HCVAb+...   Albertine Grates, FNP

## 2024-02-19 ENCOUNTER — Ambulatory Visit

## 2024-02-19 VITALS — BP 203/100 | HR 82 | Temp 98.1°F | Ht 67.0 in | Wt 146.2 lb

## 2024-02-19 DIAGNOSIS — N186 End stage renal disease: Secondary | ICD-10-CM

## 2024-02-19 DIAGNOSIS — L03114 Cellulitis of left upper limb: Secondary | ICD-10-CM

## 2024-02-19 DIAGNOSIS — Z992 Dependence on renal dialysis: Secondary | ICD-10-CM

## 2024-02-19 LAB — CERVICOVAGINAL ANCILLARY ONLY
Bacterial Vaginitis (gardnerella): POSITIVE — AB
Candida Glabrata: NEGATIVE
Candida Vaginitis: NEGATIVE
Chlamydia: NEGATIVE
Comment: NEGATIVE
Comment: NEGATIVE
Comment: NEGATIVE
Comment: NEGATIVE
Comment: NEGATIVE
Comment: NORMAL
Neisseria Gonorrhea: NEGATIVE
Trichomonas: POSITIVE — AB

## 2024-02-19 NOTE — Progress Notes (Signed)
 Office Note     CC:  follow up Requesting Provider:  Wylene Men*  HPI: Cathy [name] is a 43 y.o. (1981-01-13) female who presents for evaluation of left arm AV fistula.  She had a second stage basilic vein fistula creation by Dr. Arbie Cookey in April 2024.  Last week she describes what sounds like an infiltration event.  She however continues to have swelling especially in the left hand and forearm.  She presented to the emergency department on 02/15/2024.  Workup included left upper extremity venous duplex which was negative for DVT.  Her last HD treatment was on Tuesday, 02/16/2024.  She has ongoing pain however is now describing redness and warmth over her fistula and elbow.  She denies fevers.  She dialyzes in [place] on a Tuesday Thursday Saturday schedule.   Past Medical History:  Diagnosis Date   Anasarca associated with disorder of kidney 06/08/2021   Anxiety    Asthma    Bipolar depression (HCC)    Depression    Dyspnea    ESRD (end stage renal disease) on dialysis (HCC) 10/2020   GERD (gastroesophageal reflux disease)    Gonorrhea    Lupus    Pelvic inflammatory disease (PID)    Pleural effusion 06/08/2021   Pulmonary embolism (HCC) 01/31/2022   Renal hypertension    Trichomonas infection     Past Surgical History:  Procedure Laterality Date   AV FISTULA PLACEMENT Left 06/10/2022   Procedure: LEFT ARM ARTERIOVENOUS (AV) FISTULA CREATION;  Surgeon: Larina Earthly, MD;  Location: [facility] ORS;  Service: Vascular;  Laterality: Left;   BASCILIC VEIN TRANSPOSITION Left 03/24/2023   Procedure: LEFT ARM SECOND STAGE BASILIC VEIN TRANSPOSITION;  Surgeon: Larina Earthly, MD;  Location: [facility] ORS;  Service: Vascular;  Laterality: Left;   IR FLUORO GUIDE CV LINE RIGHT  11/30/2020   IR FLUORO GUIDE CV LINE RIGHT  06/13/2021   IR FLUORO GUIDE CV LINE RIGHT  02/01/2022   IR REMOVAL TUN CV CATH W/O FL  06/11/2021   IR REMOVAL TUN CV CATH W/O FL  01/29/2022   IR US GUIDE VASC ACCESS  RIGHT  11/30/2020   IR US GUIDE VASC ACCESS RIGHT  06/13/2021   IR US GUIDE VASC ACCESS RIGHT  02/03/2022   RENAL BIOPSY     TEE WITHOUT CARDIOVERSION N/A 06/13/2021   Procedure: TRANSESOPHAGEAL ECHOCARDIOGRAM (TEE);  Surgeon: Wendall Stade, MD;  Location: [facility] ENDOSCOPY;  Service: Cardiovascular;  Laterality: N/A;   TEE WITHOUT CARDIOVERSION N/A 02/03/2022   Procedure: TRANSESOPHAGEAL ECHOCARDIOGRAM (TEE);  Surgeon: Thomasene Ripple, DO;  Location: [facility] ENDOSCOPY;  Service: Cardiovascular;  Laterality: N/A;   TUBAL LIGATION N/A 02/03/2019   Procedure: POST PARTUM TUBAL LIGATION;  Surgeon: Clearwater Bing, MD;  Location: [facility] LD ORS;  Service: Gynecology;  Laterality: N/A;    Social History   Socioeconomic History   Marital status: Single    Spouse name: Not on file   Number of children: Not on file   Years of education: Not on file   Highest education level: Not on file  Occupational History   Occupation: unemployed  Tobacco Use   Smoking status: Every Day    Current packs/day: 0.25    Average packs/day: 0.3 packs/day for 25.2 years (6.3 ttl pk-yrs)    Types: Cigarettes    Start date: 2000   Smokeless tobacco: Never   Tobacco comments:    1-2 cigarettes daily   Vaping Use   Vaping status: Never  Used  Substance and Sexual Activity   Alcohol use: Not Currently   Drug use: Yes    Frequency: 3.0 times per week    Types: Marijuana   Sexual activity: Not Currently    Birth control/protection: None  Other Topics Concern   Not on file  Social History Narrative   Not on file   Social Drivers of Health   Financial Resource Strain: Low Risk  (02/18/2024)   Overall Financial Resource Strain (CARDIA)    Difficulty of Paying Living Expenses: Not very hard  Food Insecurity: No Food Insecurity (02/18/2024)   Hunger Vital Sign    Worried About Running Out of Food in the Last Year: Never true    Ran Out of Food in the Last Year: Never true  Transportation Needs: No Transportation Needs  (02/18/2024)   PRAPARE - Administrator, Civil Service (Medical): No    Lack of Transportation (Non-Medical): No  Physical Activity: Inactive (02/18/2024)   Exercise Vital Sign    Days of Exercise per Week: 1 day    Minutes of Exercise per Session: 0 min  Stress: Stress Concern Present (02/18/2024)   [employer] of Occupational Health - Occupational Stress Questionnaire    Feeling of Stress : To some extent  Social Connections: Socially Isolated (02/18/2024)   Social Connection and Isolation Panel [NHANES]    Frequency of Communication with Friends and Family: More than three times a week    Frequency of Social Gatherings with Friends and Family: More than three times a week    Attends Religious Services: Never    Database administrator or Organizations: No    Attends Banker Meetings: Never    Marital Status: Never married  Intimate Partner Violence: Not At Risk (02/18/2024)   Humiliation, Afraid, Rape, and Kick questionnaire    Fear of Current or Ex-Partner: No    Emotionally Abused: No    Physically Abused: No    Sexually Abused: No    Family History  Problem Relation Age of Onset   Anxiety disorder Mother    Sleep apnea Father    Diabetes Maternal Grandmother    Hypertension Maternal Grandmother    Diabetes Maternal Grandfather    Hypertension Maternal Grandfather    Diabetes Paternal Grandmother    Hypertension Paternal Grandmother    Diabetes Paternal Grandfather    Hypertension Paternal Grandfather     Current Outpatient Medications  Medication Sig Dispense Refill   acetaminophen (TYLENOL) 500 MG tablet Take 1,000 mg by mouth every 6 (six) hours as needed.     albuterol (VENTOLIN HFA) 108 (90 Base) MCG/ACT inhaler Inhale 2 puffs into the lungs every 6 (six) hours as needed for wheezing or shortness of breath. 8 g 2   amLODipine (NORVASC) 10 MG tablet Take 1 tablet (10 mg total) by mouth in the morning. 30 tablet 2   Blood Pressure  Monitoring (OMRON 3 SERIES BP MONITOR) DEVI Use as directed 2 times a day 3 kit 0   budesonide-formoterol (SYMBICORT) 160-4.5 MCG/ACT inhaler Inhale 2 puffs into the lungs 2 (two) times daily. 10.2 g 3   busPIRone (BUSPAR) 30 MG tablet TAKE 1 TABLET (30 MG TOTAL) BY MOUTH IN THE MORNING AND AT BEDTIME. 180 tablet 1   carvedilol (COREG) 25 MG tablet Take 1 tablet (25 mg total) by mouth 2 (two) times daily with a meal. 60 tablet 1   cloNIDine (CATAPRES) 0.3 MG tablet Take 1 tablet (0.3 mg total)  by mouth 3 (three) times daily. 90 tablet 2   Doxercalciferol (HECTOROL IV) Doxercalciferol (Hectorol)     hydrALAZINE (APRESOLINE) 100 MG tablet Take 1 tablet (100 mg total) by mouth 3 (three) times daily. 90 tablet 1   losartan (COZAAR) 50 MG tablet Take 50 mg by mouth at bedtime.     Methoxy PEG-Epoetin Beta (MIRCERA IJ)      ondansetron (ZOFRAN) 4 MG tablet Take 1 tablet (4 mg total) by mouth every 8 (eight) hours as needed for nausea or vomiting. 12 tablet 0   oxyCODONE-acetaminophen (PERCOCET/ROXICET) 5-325 MG tablet Take 1 tablet by mouth every 6 (six) hours as needed for up to 5 days for severe pain (pain score 7-10). 20 tablet 0   propranolol (INDERAL) 10 MG tablet TAKE 1 TABLET BY MOUTH EVERY 12 HOURS AS NEEDED. 180 tablet 0   sevelamer carbonate (RENVELA) 800 MG tablet Take 800 mg by mouth 3 (three) times daily.     topiramate (TOPAMAX) 50 MG tablet TAKE 1 TABLET BY MOUTH EVERY DAY 90 tablet 1   VELPHORO 500 MG chewable tablet Chew by mouth.     pantoprazole (PROTONIX) 40 MG tablet Take 1 tablet (40 mg total) by mouth daily before breakfast. 30 tablet 1   No current facility-administered medications for this visit.    Allergies  Allergen Reactions   Dialyvite 800 [Nephro-Vite] Nausea And Vomiting     REVIEW OF SYSTEMS:   [X]  denotes positive finding, [ ]  denotes negative finding Cardiac  Comments:  Chest pain or chest pressure:    Shortness of breath upon exertion:    Short of breath  when lying flat:    Irregular heart rhythm:        Vascular    Pain in calf, thigh, or hip brought on by ambulation:    Pain in feet at night that wakes you up from your sleep:     Blood clot in your veins:    Leg swelling:         Pulmonary    Oxygen at home:    Productive cough:     Wheezing:         Neurologic    Sudden weakness in arms or legs:     Sudden numbness in arms or legs:     Sudden onset of difficulty speaking or slurred speech:    Temporary loss of vision in one eye:     Problems with dizziness:         Gastrointestinal    Blood in stool:     Vomited blood:         Genitourinary    Burning when urinating:     Blood in urine:        Psychiatric    Major depression:         Hematologic    Bleeding problems:    Problems with blood clotting too easily:        Skin    Rashes or ulcers:        Constitutional    Fever or chills:      PHYSICAL EXAMINATION:  Vitals:   02/19/24 1011  BP: (!) 203/100  Pulse: 82  Temp: 98.1 F (36.7 C)  TempSrc: Temporal  SpO2: 97%  Weight: 146 lb 3.2 oz (66.3 kg)  Height: 5\' 7"  (1.702 m)    General:  WDWN in NAD; vital signs documented above Gait: Not observed HENT: WNL, normocephalic Pulmonary: normal non-labored breathing , without Rales,  rhonchi,  wheezing Cardiac: regular HR Abdomen: soft, NT, no masses Skin: without rashes Vascular Exam/Pulses: palpable L radial pulse Extremities: Palpable thrill through left arm fistula; tender to touch over fistula and elbow as well as into her forearm; fistula and elbow are warm with mild erythema Musculoskeletal: no muscle wasting or atrophy  Neurologic: A&O X 3 Psychiatric:  The pt has Normal affect.   Non-Invasive Vascular Imaging:   Left upper extremity venous duplex negative for DVT on 02/15/2024    ASSESSMENT/PLAN:: 43 y.o. female here for follow up for evaluation of left arm fistula  Cathy [name] is a 43 year old female who experienced what sounds  like an infiltration event at dialysis last week.  She continues to have pain around her fistula.  On exam she also has redness overlying fistula near the anastomosis as well as into her elbow.  I am concerned about an evolving cellulitis.  Ok to cannulate fistula as long as this is tolerable.  She will be started on IV antibiotics for the next 2 weeks during her HD treatments.  If fistula continues to be painful, we can consider placement of TDC to rest fistula as long as erythema and infectious picture resolves.  I will also have her scheduled for a fistulogram in the next 1 to 2 weeks to evaluate for central stenosis.  Plan was discussed in detail with the patient and she is agreeable to proceed.   Emilie Rutter, PA-C [facility] [phone]  Clinic MD:   Randie Heinz on call

## 2024-02-20 ENCOUNTER — Other Ambulatory Visit: Payer: Self-pay

## 2024-02-20 ENCOUNTER — Emergency Department (HOSPITAL_COMMUNITY)

## 2024-02-20 ENCOUNTER — Inpatient Hospital Stay (HOSPITAL_COMMUNITY)
Admission: EM | Admit: 2024-02-20 | Discharge: 2024-02-24 | DRG: 314 | Disposition: A | Discharge status: Home / Self Care | Attending: Internal Medicine | Admitting: Internal Medicine

## 2024-02-20 ENCOUNTER — Encounter (HOSPITAL_COMMUNITY): Payer: Self-pay | Admitting: *Deleted

## 2024-02-20 DIAGNOSIS — A5901 Trichomonal vulvovaginitis: Secondary | ICD-10-CM | POA: Diagnosis present

## 2024-02-20 DIAGNOSIS — L03114 Cellulitis of left upper limb: Principal | ICD-10-CM | POA: Diagnosis present

## 2024-02-20 DIAGNOSIS — I871 Compression of vein: Secondary | ICD-10-CM | POA: Diagnosis present

## 2024-02-20 DIAGNOSIS — Z833 Family history of diabetes mellitus: Secondary | ICD-10-CM

## 2024-02-20 DIAGNOSIS — I120 Hypertensive chronic kidney disease with stage 5 chronic kidney disease or end stage renal disease: Secondary | ICD-10-CM | POA: Diagnosis not present

## 2024-02-20 DIAGNOSIS — F17210 Nicotine dependence, cigarettes, uncomplicated: Secondary | ICD-10-CM | POA: Diagnosis present

## 2024-02-20 DIAGNOSIS — I132 Hypertensive heart and chronic kidney disease with heart failure and with stage 5 chronic kidney disease, or end stage renal disease: Secondary | ICD-10-CM | POA: Diagnosis present

## 2024-02-20 DIAGNOSIS — T801XXA Vascular complications following infusion, transfusion and therapeutic injection, initial encounter: Principal | ICD-10-CM | POA: Diagnosis present

## 2024-02-20 DIAGNOSIS — J811 Chronic pulmonary edema: Secondary | ICD-10-CM | POA: Diagnosis not present

## 2024-02-20 DIAGNOSIS — Z992 Dependence on renal dialysis: Secondary | ICD-10-CM | POA: Diagnosis not present

## 2024-02-20 DIAGNOSIS — Z8249 Family history of ischemic heart disease and other diseases of the circulatory system: Secondary | ICD-10-CM | POA: Diagnosis not present

## 2024-02-20 DIAGNOSIS — M3214 Glomerular disease in systemic lupus erythematosus: Secondary | ICD-10-CM | POA: Diagnosis present

## 2024-02-20 DIAGNOSIS — I5042 Chronic combined systolic (congestive) and diastolic (congestive) heart failure: Secondary | ICD-10-CM | POA: Diagnosis present

## 2024-02-20 DIAGNOSIS — Z7951 Long term (current) use of inhaled steroids: Secondary | ICD-10-CM | POA: Diagnosis not present

## 2024-02-20 DIAGNOSIS — D649 Anemia, unspecified: Secondary | ICD-10-CM | POA: Diagnosis present

## 2024-02-20 DIAGNOSIS — I10 Essential (primary) hypertension: Secondary | ICD-10-CM | POA: Diagnosis present

## 2024-02-20 DIAGNOSIS — I5022 Chronic systolic (congestive) heart failure: Secondary | ICD-10-CM | POA: Diagnosis present

## 2024-02-20 DIAGNOSIS — F329 Major depressive disorder, single episode, unspecified: Secondary | ICD-10-CM | POA: Diagnosis present

## 2024-02-20 DIAGNOSIS — F419 Anxiety disorder, unspecified: Secondary | ICD-10-CM | POA: Diagnosis present

## 2024-02-20 DIAGNOSIS — N185 Chronic kidney disease, stage 5: Secondary | ICD-10-CM | POA: Diagnosis not present

## 2024-02-20 DIAGNOSIS — Z79899 Other long term (current) drug therapy: Secondary | ICD-10-CM

## 2024-02-20 DIAGNOSIS — N2581 Secondary hyperparathyroidism of renal origin: Secondary | ICD-10-CM | POA: Diagnosis present

## 2024-02-20 DIAGNOSIS — J90 Pleural effusion, not elsewhere classified: Secondary | ICD-10-CM | POA: Diagnosis not present

## 2024-02-20 DIAGNOSIS — L0390 Cellulitis, unspecified: Secondary | ICD-10-CM | POA: Diagnosis present

## 2024-02-20 DIAGNOSIS — F319 Bipolar disorder, unspecified: Secondary | ICD-10-CM | POA: Diagnosis present

## 2024-02-20 DIAGNOSIS — K219 Gastro-esophageal reflux disease without esophagitis: Secondary | ICD-10-CM | POA: Diagnosis present

## 2024-02-20 DIAGNOSIS — R0602 Shortness of breath: Secondary | ICD-10-CM | POA: Diagnosis not present

## 2024-02-20 DIAGNOSIS — J4489 Other specified chronic obstructive pulmonary disease: Secondary | ICD-10-CM | POA: Diagnosis present

## 2024-02-20 DIAGNOSIS — I5043 Acute on chronic combined systolic (congestive) and diastolic (congestive) heart failure: Secondary | ICD-10-CM | POA: Diagnosis not present

## 2024-02-20 DIAGNOSIS — E875 Hyperkalemia: Secondary | ICD-10-CM | POA: Diagnosis present

## 2024-02-20 DIAGNOSIS — K7469 Other cirrhosis of liver: Secondary | ICD-10-CM | POA: Diagnosis present

## 2024-02-20 DIAGNOSIS — I2782 Chronic pulmonary embolism: Secondary | ICD-10-CM | POA: Diagnosis not present

## 2024-02-20 DIAGNOSIS — E785 Hyperlipidemia, unspecified: Secondary | ICD-10-CM | POA: Diagnosis present

## 2024-02-20 DIAGNOSIS — M329 Systemic lupus erythematosus, unspecified: Secondary | ICD-10-CM | POA: Diagnosis present

## 2024-02-20 DIAGNOSIS — D631 Anemia in chronic kidney disease: Secondary | ICD-10-CM | POA: Diagnosis present

## 2024-02-20 DIAGNOSIS — N186 End stage renal disease: Secondary | ICD-10-CM | POA: Diagnosis present

## 2024-02-20 DIAGNOSIS — K7460 Unspecified cirrhosis of liver: Secondary | ICD-10-CM | POA: Diagnosis present

## 2024-02-20 DIAGNOSIS — Z818 Family history of other mental and behavioral disorders: Secondary | ICD-10-CM

## 2024-02-20 DIAGNOSIS — M79602 Pain in left arm: Secondary | ICD-10-CM | POA: Diagnosis not present

## 2024-02-20 DIAGNOSIS — R001 Bradycardia, unspecified: Secondary | ICD-10-CM | POA: Diagnosis not present

## 2024-02-20 DIAGNOSIS — K7581 Nonalcoholic steatohepatitis (NASH): Secondary | ICD-10-CM | POA: Diagnosis present

## 2024-02-20 DIAGNOSIS — N898 Other specified noninflammatory disorders of vagina: Secondary | ICD-10-CM | POA: Diagnosis present

## 2024-02-20 DIAGNOSIS — F32A Depression, unspecified: Secondary | ICD-10-CM | POA: Diagnosis present

## 2024-02-20 DIAGNOSIS — G8929 Other chronic pain: Secondary | ICD-10-CM | POA: Diagnosis present

## 2024-02-20 DIAGNOSIS — Z888 Allergy status to other drugs, medicaments and biological substances status: Secondary | ICD-10-CM

## 2024-02-20 DIAGNOSIS — R188 Other ascites: Secondary | ICD-10-CM | POA: Diagnosis present

## 2024-02-20 DIAGNOSIS — Y711 Therapeutic (nonsurgical) and rehabilitative cardiovascular devices associated with adverse incidents: Secondary | ICD-10-CM | POA: Diagnosis present

## 2024-02-20 DIAGNOSIS — T82898A Other specified complication of vascular prosthetic devices, implants and grafts, initial encounter: Secondary | ICD-10-CM | POA: Diagnosis not present

## 2024-02-20 DIAGNOSIS — M7989 Other specified soft tissue disorders: Secondary | ICD-10-CM | POA: Diagnosis present

## 2024-02-20 LAB — COMPREHENSIVE METABOLIC PANEL
ALT: 12 U/L (ref 0–44)
AST: 15 U/L (ref 15–41)
Albumin: 3 g/dL — ABNORMAL LOW (ref 3.5–5.0)
Alkaline Phosphatase: 148 U/L — ABNORMAL HIGH (ref 38–126)
Anion gap: 20 — ABNORMAL HIGH (ref 5–15)
BUN: 76 mg/dL — ABNORMAL HIGH (ref 6–20)
CO2: 25 mmol/L (ref 22–32)
Calcium: 9.2 mg/dL (ref 8.9–10.3)
Chloride: 95 mmol/L — ABNORMAL LOW (ref 98–111)
Creatinine, Ser: 14.87 mg/dL — ABNORMAL HIGH (ref 0.44–1.00)
GFR, Estimated: 3 mL/min — ABNORMAL LOW (ref >60)
Glucose, Bld: 102 mg/dL — ABNORMAL HIGH (ref 70–99)
Potassium: 5.4 mmol/L — ABNORMAL HIGH (ref 3.5–5.1)
Sodium: 140 mmol/L (ref 135–145)
Total Bilirubin: 0.9 mg/dL (ref 0.0–1.2)
Total Protein: 6.4 g/dL — ABNORMAL LOW (ref 6.5–8.1)

## 2024-02-20 LAB — LACTIC ACID, PLASMA
Lactic Acid, Venous: 0.9 mmol/L (ref 0.5–1.9)
Lactic Acid, Venous: 0.7 mmol/L (ref 0.5–1.9)

## 2024-02-20 LAB — CBC WITH DIFFERENTIAL/PLATELET
Abs Immature Granulocytes: 0.03 10*3/uL (ref 0.00–0.07)
Basophils Absolute: 0 10*3/uL (ref 0.0–0.1)
Basophils Relative: 1 %
Eosinophils Absolute: 0.1 10*3/uL (ref 0.0–0.5)
Eosinophils Relative: 4 %
HCT: 32.4 % — ABNORMAL LOW (ref 36.0–46.0)
Hemoglobin: 10 g/dL — ABNORMAL LOW (ref 12.0–15.0)
Immature Granulocytes: 1 %
Lymphocytes Relative: 13 %
Lymphs Abs: 0.5 10*3/uL — ABNORMAL LOW (ref 0.7–4.0)
MCH: 30 pg (ref 26.0–34.0)
MCHC: 30.9 g/dL (ref 30.0–36.0)
MCV: 97.3 fL (ref 80.0–100.0)
Monocytes Absolute: 0.3 10*3/uL (ref 0.1–1.0)
Monocytes Relative: 8 %
Neutro Abs: 3 10*3/uL (ref 1.7–7.7)
Neutrophils Relative %: 73 %
Platelets: 87 10*3/uL — ABNORMAL LOW (ref 150–400)
RBC: 3.33 MIL/uL — ABNORMAL LOW (ref 3.87–5.11)
RDW: 18.6 % — ABNORMAL HIGH (ref 11.5–15.5)
WBC: 4 10*3/uL (ref 4.0–10.5)
nRBC: 0 % (ref 0.0–0.2)

## 2024-02-20 LAB — HIV ANTIBODY (ROUTINE TESTING W REFLEX): HIV Screen 4th Generation wRfx: NONREACTIVE

## 2024-02-20 MED ORDER — HYDROMORPHONE HCL 1 MG/ML IJ SOLN
0.5000 mg | INTRAMUSCULAR | Status: DC | PRN
  Administered 2024-02-20 – 2024-02-24 (×20): 0.5 mg via INTRAVENOUS
  Filled 2024-02-20 (×21): qty 0.5

## 2024-02-20 MED ORDER — CARVEDILOL 25 MG PO TABS
25.0000 mg | ORAL_TABLET | Freq: Two times a day (BID) | ORAL | Status: DC
  Administered 2024-02-22 – 2024-02-24 (×5): 25 mg via ORAL
  Filled 2024-02-20 (×5): qty 1

## 2024-02-20 MED ORDER — SODIUM ZIRCONIUM CYCLOSILICATE 5 G PO PACK
10.0000 g | PACK | Freq: Once | ORAL | Status: AC
  Administered 2024-02-20: 10 g via ORAL
  Filled 2024-02-20: qty 2

## 2024-02-20 MED ORDER — AMLODIPINE BESYLATE 10 MG PO TABS
10.0000 mg | ORAL_TABLET | Freq: Every day | ORAL | Status: DC
  Administered 2024-02-21 – 2024-02-24 (×4): 10 mg via ORAL
  Filled 2024-02-20 (×4): qty 1

## 2024-02-20 MED ORDER — FENTANYL CITRATE PF 50 MCG/ML IJ SOSY
50.0000 ug | PREFILLED_SYRINGE | Freq: Once | INTRAMUSCULAR | Status: AC
  Administered 2024-02-20: 50 ug via INTRAVENOUS
  Filled 2024-02-20: qty 1

## 2024-02-20 MED ORDER — CLONIDINE HCL 0.1 MG PO TABS
0.3000 mg | ORAL_TABLET | Freq: Three times a day (TID) | ORAL | Status: DC
  Administered 2024-02-20 – 2024-02-24 (×11): 0.3 mg via ORAL
  Filled 2024-02-20 (×12): qty 3

## 2024-02-20 MED ORDER — SODIUM ZIRCONIUM CYCLOSILICATE 10 G PO PACK
10.0000 g | PACK | Freq: Three times a day (TID) | ORAL | Status: AC
  Administered 2024-02-20 – 2024-02-21 (×3): 10 g via ORAL
  Filled 2024-02-20 (×3): qty 1

## 2024-02-20 MED ORDER — SUCROFERRIC OXYHYDROXIDE 500 MG PO CHEW: 500.0000 mg | CHEWABLE_TABLET | Freq: Three times a day (TID) | ORAL | Status: DC

## 2024-02-20 MED ORDER — SODIUM CHLORIDE 0.9 % IV SOLN
2.0000 g | INTRAVENOUS | Status: DC
  Administered 2024-02-21 – 2024-02-23 (×3): 2 g via INTRAVENOUS
  Filled 2024-02-20 (×3): qty 20

## 2024-02-20 MED ORDER — ONDANSETRON HCL 4 MG/2ML IJ SOLN: 4.0000 mg | Freq: Four times a day (QID) | INTRAMUSCULAR | Status: DC | PRN

## 2024-02-20 MED ORDER — OXYCODONE HCL 5 MG PO TABS
5.0000 mg | ORAL_TABLET | Freq: Four times a day (QID) | ORAL | Status: DC | PRN
  Administered 2024-02-20 – 2024-02-23 (×5): 5 mg via ORAL
  Filled 2024-02-20 (×6): qty 1

## 2024-02-20 MED ORDER — HEPARIN SODIUM (PORCINE) 5000 UNIT/ML IJ SOLN
5000.0000 [IU] | Freq: Three times a day (TID) | INTRAMUSCULAR | Status: DC
  Administered 2024-02-20 – 2024-02-23 (×7): 5000 [IU] via SUBCUTANEOUS
  Filled 2024-02-20 (×8): qty 1

## 2024-02-20 MED ORDER — SODIUM CHLORIDE 0.9 % IV SOLN
2.0000 g | Freq: Once | INTRAVENOUS | Status: AC
  Administered 2024-02-20: 2 g via INTRAVENOUS
  Filled 2024-02-20: qty 20

## 2024-02-20 MED ORDER — TOPIRAMATE 25 MG PO TABS
50.0000 mg | ORAL_TABLET | Freq: Every day | ORAL | Status: DC
  Administered 2024-02-21 – 2024-02-24 (×4): 50 mg via ORAL
  Filled 2024-02-20 (×4): qty 2

## 2024-02-20 MED ORDER — PANTOPRAZOLE SODIUM 40 MG PO TBEC
40.0000 mg | DELAYED_RELEASE_TABLET | Freq: Every day | ORAL | Status: DC
  Administered 2024-02-21 – 2024-02-24 (×4): 40 mg via ORAL
  Filled 2024-02-20 (×4): qty 1

## 2024-02-20 MED ORDER — HYDRALAZINE HCL 50 MG PO TABS
100.0000 mg | ORAL_TABLET | Freq: Three times a day (TID) | ORAL | Status: DC
  Administered 2024-02-20 – 2024-02-24 (×10): 100 mg via ORAL
  Filled 2024-02-20 (×11): qty 2

## 2024-02-20 MED ORDER — ACETAMINOPHEN 650 MG RE SUPP: 650.0000 mg | Freq: Four times a day (QID) | RECTAL | Status: DC | PRN

## 2024-02-20 MED ORDER — ONDANSETRON HCL 4 MG/2ML IJ SOLN
4.0000 mg | Freq: Once | INTRAMUSCULAR | Status: AC
  Administered 2024-02-20: 4 mg via INTRAVENOUS
  Filled 2024-02-20: qty 2

## 2024-02-20 MED ORDER — ACETAMINOPHEN 325 MG PO TABS
650.0000 mg | ORAL_TABLET | Freq: Four times a day (QID) | ORAL | Status: DC | PRN
  Administered 2024-02-20 – 2024-02-22 (×2): 650 mg via ORAL
  Filled 2024-02-20 (×2): qty 2

## 2024-02-20 MED ORDER — SEVELAMER CARBONATE 800 MG PO TABS
800.0000 mg | ORAL_TABLET | Freq: Three times a day (TID) | ORAL | Status: DC
  Administered 2024-02-20 – 2024-02-24 (×8): 800 mg via ORAL
  Filled 2024-02-20 (×8): qty 1

## 2024-02-20 MED ORDER — LOSARTAN POTASSIUM 50 MG PO TABS
50.0000 mg | ORAL_TABLET | Freq: Every day | ORAL | Status: DC
  Administered 2024-02-23 (×2): 50 mg via ORAL
  Filled 2024-02-20 (×3): qty 1

## 2024-02-20 MED ORDER — BUSPIRONE HCL 10 MG PO TABS
30.0000 mg | ORAL_TABLET | Freq: Two times a day (BID) | ORAL | Status: DC
  Administered 2024-02-20 – 2024-02-24 (×8): 30 mg via ORAL
  Filled 2024-02-20 (×8): qty 3

## 2024-02-20 MED ORDER — ONDANSETRON HCL 4 MG PO TABS: 4.0000 mg | ORAL_TABLET | Freq: Four times a day (QID) | ORAL | Status: DC | PRN

## 2024-02-20 MED ORDER — METRONIDAZOLE 500 MG PO TABS
500.0000 mg | ORAL_TABLET | Freq: Two times a day (BID) | ORAL | Status: DC
  Administered 2024-02-20 – 2024-02-24 (×9): 500 mg via ORAL
  Filled 2024-02-20 (×9): qty 1

## 2024-02-20 NOTE — ED Provider Notes (Signed)
 [place] EMERGENCY DEPARTMENT AT [facility] Provider Note   CSN: [id number] Arrival date & time: 02/20/24  1020     History  Chief Complaint  Patient presents with   Arm Swelling    Cathy [name] is a 43 y.o. female.  43 year old female with past medical history of end-stage renal disease on dialysis presenting to the emergency department today with concern for pain and swelling her left upper extremity.  The patient's last dialysis was on Tuesday.  She has been having swelling in her arm.  She states this started last week after a dialysis session.  She has followed up with vascular surgery who apparently felt that this may have been secondary to cellulitis.  The patient is not currently on any antibiotics.  She states that she did try dialysis once since and the symptoms got worse.  She states that the swelling was worse today which was what prompted her to come to the emergency department.        Home Medications Prior to Admission medications   Medication Sig Start Date End Date Taking? Authorizing Provider  acetaminophen (TYLENOL) 500 MG tablet Take 1,000 mg by mouth every 6 (six) hours as needed.    [provider]  albuterol (VENTOLIN HFA) 108 (90 Base) MCG/ACT inhaler Inhale 2 puffs into the lungs every 6 (six) hours as needed for wheezing or shortness of breath. 12/18/23   Del Nigel Berthold, FNP  amLODipine (NORVASC) 10 MG tablet Take 1 tablet (10 mg total) by mouth in the morning. 08/27/23   Del Nigel Berthold, FNP  Blood Pressure Monitoring (OMRON 3 SERIES BP MONITOR) DEVI Use as directed 2 times a day 08/27/23   [name], Tenna Child, FNP  budesonide-formoterol (SYMBICORT) 160-4.5 MCG/ACT inhaler Inhale 2 puffs into the lungs 2 (two) times daily. 12/18/23   [name], Tenna Child, FNP  busPIRone (BUSPAR) 30 MG tablet TAKE 1 TABLET (30 MG TOTAL) BY MOUTH IN THE MORNING AND AT BEDTIME. 01/11/24   [name], Tenna Child, FNP   carvedilol (COREG) 25 MG tablet Take 1 tablet (25 mg total) by mouth 2 (two) times daily with a meal. 08/27/23   [name], Tenna Child, FNP  cloNIDine (CATAPRES) 0.3 MG tablet Take 1 tablet (0.3 mg total) by mouth 3 (three) times daily. 08/27/23   Del Nigel Berthold, FNP  Doxercalciferol (HECTOROL IV) Doxercalciferol (Hectorol) 02/09/24 02/07/25  [provider]  hydrALAZINE (APRESOLINE) 100 MG tablet Take 1 tablet (100 mg total) by mouth 3 (three) times daily. 08/27/23   Del Nigel Berthold, FNP  losartan (COZAAR) 50 MG tablet Take 50 mg by mouth at bedtime. 01/27/23   [provider]  Methoxy PEG-Epoetin Beta (MIRCERA IJ)  01/12/24 01/10/25  [provider]  ondansetron (ZOFRAN) 4 MG tablet Take 1 tablet (4 mg total) by mouth every 8 (eight) hours as needed for nausea or vomiting. 09/30/23   Del Nigel Berthold, FNP  oxyCODONE-acetaminophen (PERCOCET/ROXICET) 5-325 MG tablet Take 1 tablet by mouth every 6 (six) hours as needed for up to 5 days for severe pain (pain score 7-10). 02/15/24 02/20/24  Maxwell Marion, PA-C  pantoprazole (PROTONIX) 40 MG tablet Take 1 tablet (40 mg total) by mouth daily before breakfast. 08/27/23 02/01/24  Del Nigel Berthold, FNP  propranolol (INDERAL) 10 MG tablet TAKE 1 TABLET BY MOUTH EVERY 12 HOURS AS NEEDED. 02/15/24   Del Nigel Berthold, FNP  sevelamer carbonate (RENVELA) 800 MG tablet  Take 800 mg by mouth 3 (three) times daily. 10/08/23   [provider]  topiramate (TOPAMAX) 50 MG tablet TAKE 1 TABLET BY MOUTH EVERY DAY 12/21/23   [name], Tenna Child, FNP  VELPHORO 500 MG chewable tablet Chew by mouth.    [provider]  fenofibrate 160 MG tablet Take 1 tablet (160 mg total) by mouth daily. 12/31/20 01/20/21  Regalado, Prentiss Bells, MD      Allergies    Dialyvite 800 [nephro-vite]    Review of Systems   Review of Systems  Musculoskeletal:        Arm swelling  All other systems reviewed and are  negative.   Physical Exam Updated Vital Signs BP 125/81 (BP Location: Right Arm)   Pulse (!) 52   Temp 99.4 F (37.4 C) (Oral)   Resp 18   Wt 66.2 kg   SpO2 97%   BMI 22.87 kg/m  Physical Exam Vitals and nursing note reviewed.   Gen: NAD Eyes: PERRL, EOMI HEENT: no oropharyngeal swelling Neck: trachea midline Resp: clear to auscultation bilaterally Card: RRR, no murmurs, rubs, or gallops Abd: nontender, nondistended Extremities: There is significant swelling noted to the left upper extremity, compartments are soft, there is some mild overlying erythema noted over the patient's fistula, there is a palpable thrill and bruit on auscultation Vascular: Palpable radial pulse on the left with normal capillary refill throughout Skin: no rashes Psyc: acting appropriately   ED Results / Procedures / Treatments   Labs (all labs ordered are listed, but only abnormal results are displayed) Labs Reviewed  COMPREHENSIVE METABOLIC PANEL - Abnormal; Notable for the following components:      Result Value   Potassium 5.4 (*)    Chloride 95 (*)    Glucose, Bld 102 (*)    BUN 76 (*)    Creatinine, Ser 14.87 (*)    Total Protein 6.4 (*)    Albumin 3.0 (*)    Alkaline Phosphatase 148 (*)    GFR, Estimated 3 (*)    Anion gap 20 (*)    All other components within normal limits  CBC WITH DIFFERENTIAL/PLATELET - Abnormal; Notable for the following components:   RBC 3.33 (*)    Hemoglobin 10.0 (*)    HCT 32.4 (*)    RDW 18.6 (*)    Platelets 87 (*)    Lymphs Abs 0.5 (*)    All other components within normal limits  CULTURE, BLOOD (ROUTINE X 2)  CULTURE, BLOOD (ROUTINE X 2)  LACTIC ACID, PLASMA  LACTIC ACID, PLASMA    EKG EKG Interpretation Date/Time:  Saturday February 20 2024 11:05:12 EDT Ventricular Rate:  54 PR Interval:  163 QRS Duration:  73 QT Interval:  492 QTC Calculation: 467 R Axis:   85  Text Interpretation: Sinus rhythm Left atrial enlargement Probable  anterolateral infarct, old Nonspecific ST changes Confirmed by Beckey Downing [phone]) on 02/20/2024 11:37:53 AM  Radiology DG Chest 2 View Result Date: 02/20/2024 CLINICAL DATA:  Shortness of breath with left arm swelling and pain EXAM: CHEST - 2 VIEW COMPARISON:  Chest radiograph dated 02/01/2024 FINDINGS: Patient is rotated slightly to the right. Normal lung volumes. Increased diffuse bilateral interstitial and perihilar pulmonary vasculature. Blunting of the bilateral costophrenic angles. No pneumothorax. Similar markedly enlarged cardiomediastinal silhouette. No acute osseous abnormality. IMPRESSION: Cardiomegaly with pulmonary edema and small bilateral pleural effusions. Electronically Signed   By: Agustin Cree M.D.   On: 02/20/2024 11:57    Procedures Procedures  Medications Ordered in ED Medications  fentaNYL (SUBLIMAZE) injection 50 mcg (has no administration in time range)  cefTRIAXone (ROCEPHIN) 2 g in sodium chloride 0.9 % 100 mL IVPB (has no administration in time range)  ondansetron (ZOFRAN) injection 4 mg (4 mg Intravenous Given 02/20/24 1214)  fentaNYL (SUBLIMAZE) injection 50 mcg (50 mcg Intravenous Given 02/20/24 1215)    ED Course/ Medical Decision Making/ A&P                                 Medical Decision Making 43 year old female with past medical history of end-stage renal disease on dialysis presenting to the emergency department today with left upper extremity swelling.  The patient does have a thrill and bruit here.  She does have significant swelling to the arm.  There are no signs of compartment syndrome at this time.  I will obtain basic labs here as well as a lactic acid and blood cultures.  Will call and discuss her case with nephrology.  I did discuss the patient's case with Dr. Juel Burrow from nephrology.  He recommends having the patient admitted and transferred to [facility] for possible dialysis and for possible IR evaluation if she does require a dialysis catheter.   Looking through the patient's note from vascular surgery it did appear that they would be okay with the patient trying dialysis again and did recommend antibiotics with dialysis.  A call was placed to hospitalist service for admission.  The patient's potassium is only minimally elevated at 5.4.  She does not require emergent dialysis at this time.  Amount and/or Complexity of Data Reviewed Labs: ordered. Radiology: ordered.  Risk Prescription drug management. Decision regarding hospitalization.           Final Clinical Impression(s) / ED Diagnoses Final diagnoses:  Left arm cellulitis    Rx / DC Orders ED Discharge Orders     None         Durwin Glaze, MD 02/20/24 1328

## 2024-02-20 NOTE — H&P (Addendum)
 History and Physical    Cathy [name] ZOX:[id number] DOB: Apr 21, 1981 DOA: 02/20/2024  PCP: Rica Records, FNP   Patient coming from: Home  Chief Complaint: Left upper extremity swelling/pain  HPI: Cathy [name] is a 43 y.o. female with medical history significant for ESRD on HD, prior PE, NASH, systolic and diastolic CHF, hypertension, and SLE who presented to the ED with concerns of left upper extremity pain and swelling.  Her last dialysis session was on Tuesday and she has been having worsening swelling to her arm since then.  She has followed up with vascular surgery and was noted to be negative for DVT on ultrasound 02/15/2024.  It was thought that she may be experiencing cellulitis and was ordered to remain on IV antibiotics for the next 2 weeks during HD treatments with plans for fistulogram in the next 1 to 2 weeks to evaluate for central stenosis.  She has not received any IV antibiotics and tried dialysis again, but her symptoms got worse.  Due to her worsening swelling and pain she was prompted to come to the ED for further evaluation.  She is also noted to have some mild increasing shortness of breath as well as cough and orthopnea.  She denies any fevers or chills.   ED Course: Vital signs stable and patient afebrile.  Hemoglobin 10 and potassium 5.4 with creatinine 14.87 and BUN 76.  Chest x-ray with some cardiomegaly with pleural effusions noted.  Patient has been started on Rocephin and blood cultures have been obtained.  EDP has discussed with nephrology Dr. Juel Burrow who recommends transfer to [facility] for dialysis and potential further evaluation by vascular surgery as needed.  Review of Systems: Reviewed as noted above, otherwise negative.  Past Medical History:  Diagnosis Date   Anasarca associated with disorder of kidney 06/08/2021   Anxiety    Asthma    Bipolar depression (HCC)    Depression    Dyspnea    ESRD (end stage renal disease) on dialysis (HCC)  10/2020   GERD (gastroesophageal reflux disease)    Gonorrhea    Lupus    Pelvic inflammatory disease (PID)    Pleural effusion 06/08/2021   Pulmonary embolism (HCC) 01/31/2022   Renal hypertension    Trichomonas infection     Past Surgical History:  Procedure Laterality Date   AV FISTULA PLACEMENT Left 06/10/2022   Procedure: LEFT ARM ARTERIOVENOUS (AV) FISTULA CREATION;  Surgeon: Larina Earthly, MD;  Location: [facility] ORS;  Service: Vascular;  Laterality: Left;   BASCILIC VEIN TRANSPOSITION Left 03/24/2023   Procedure: LEFT ARM SECOND STAGE BASILIC VEIN TRANSPOSITION;  Surgeon: Larina Earthly, MD;  Location: [facility] ORS;  Service: Vascular;  Laterality: Left;   IR FLUORO GUIDE CV LINE RIGHT  11/30/2020   IR FLUORO GUIDE CV LINE RIGHT  06/13/2021   IR FLUORO GUIDE CV LINE RIGHT  02/01/2022   IR REMOVAL TUN CV CATH W/O FL  06/11/2021   IR REMOVAL TUN CV CATH W/O FL  01/29/2022   IR US GUIDE VASC ACCESS RIGHT  11/30/2020   IR US GUIDE VASC ACCESS RIGHT  06/13/2021   IR US GUIDE VASC ACCESS RIGHT  02/03/2022   RENAL BIOPSY     TEE WITHOUT CARDIOVERSION N/A 06/13/2021   Procedure: TRANSESOPHAGEAL ECHOCARDIOGRAM (TEE);  Surgeon: Wendall Stade, MD;  Location: [facility] ENDOSCOPY;  Service: Cardiovascular;  Laterality: N/A;   TEE WITHOUT CARDIOVERSION N/A 02/03/2022   Procedure: TRANSESOPHAGEAL ECHOCARDIOGRAM (TEE);  Surgeon: Servando Salina,  Lavona Mound, DO;  Location: [facility] ENDOSCOPY;  Service: Cardiovascular;  Laterality: N/A;   TUBAL LIGATION N/A 02/03/2019   Procedure: POST PARTUM TUBAL LIGATION;  Surgeon: Daisy Bing, MD;  Location: [facility] LD ORS;  Service: Gynecology;  Laterality: N/A;     reports that she has been smoking cigarettes. She started smoking about 25 years ago. She has a 6.3 pack-year smoking history. She has never used smokeless tobacco. She reports that she does not currently use alcohol. She reports current drug use. Frequency: 3.00 times per week. Drug: Marijuana.  Allergies  Allergen Reactions   Dialyvite 800  [Nephro-Vite] Nausea And Vomiting    Family History  Problem Relation Age of Onset   Anxiety disorder Mother    Sleep apnea Father    Diabetes Maternal Grandmother    Hypertension Maternal Grandmother    Diabetes Maternal Grandfather    Hypertension Maternal Grandfather    Diabetes Paternal Grandmother    Hypertension Paternal Grandmother    Diabetes Paternal Grandfather    Hypertension Paternal Grandfather     Prior to Admission medications   Medication Sig Start Date End Date Taking? Authorizing Provider  acetaminophen (TYLENOL) 500 MG tablet Take 1,000 mg by mouth every 6 (six) hours as needed.    [provider]  albuterol (VENTOLIN HFA) 108 (90 Base) MCG/ACT inhaler Inhale 2 puffs into the lungs every 6 (six) hours as needed for wheezing or shortness of breath. 12/18/23   Del Nigel Berthold, FNP  amLODipine (NORVASC) 10 MG tablet Take 1 tablet (10 mg total) by mouth in the morning. 08/27/23   Del Nigel Berthold, FNP  Blood Pressure Monitoring (OMRON 3 SERIES BP MONITOR) DEVI Use as directed 2 times a day 08/27/23   [name], Tenna Child, FNP  budesonide-formoterol (SYMBICORT) 160-4.5 MCG/ACT inhaler Inhale 2 puffs into the lungs 2 (two) times daily. 12/18/23   [name], Tenna Child, FNP  busPIRone (BUSPAR) 30 MG tablet TAKE 1 TABLET (30 MG TOTAL) BY MOUTH IN THE MORNING AND AT BEDTIME. 01/11/24   [name], Tenna Child, FNP  carvedilol (COREG) 25 MG tablet Take 1 tablet (25 mg total) by mouth 2 (two) times daily with a meal. 08/27/23   [name], Tenna Child, FNP  cloNIDine (CATAPRES) 0.3 MG tablet Take 1 tablet (0.3 mg total) by mouth 3 (three) times daily. 08/27/23   Del Nigel Berthold, FNP  Doxercalciferol (HECTOROL IV) Doxercalciferol (Hectorol) 02/09/24 02/07/25  [provider]  hydrALAZINE (APRESOLINE) 100 MG tablet Take 1 tablet (100 mg total) by mouth 3 (three) times daily. 08/27/23   Del Nigel Berthold, FNP  losartan (COZAAR) 50 MG  tablet Take 50 mg by mouth at bedtime. 01/27/23   [provider]  Methoxy PEG-Epoetin Beta (MIRCERA IJ)  01/12/24 01/10/25  [provider]  ondansetron (ZOFRAN) 4 MG tablet Take 1 tablet (4 mg total) by mouth every 8 (eight) hours as needed for nausea or vomiting. 09/30/23   Del Nigel Berthold, FNP  oxyCODONE-acetaminophen (PERCOCET/ROXICET) 5-325 MG tablet Take 1 tablet by mouth every 6 (six) hours as needed for up to 5 days for severe pain (pain score 7-10). 02/15/24 02/20/24  Maxwell Marion, PA-C  pantoprazole (PROTONIX) 40 MG tablet Take 1 tablet (40 mg total) by mouth daily before breakfast. 08/27/23 02/01/24  Del Nigel Berthold, FNP  propranolol (INDERAL) 10 MG tablet TAKE 1 TABLET BY MOUTH EVERY 12 HOURS AS NEEDED. 02/15/24   Del Nigel Berthold, FNP  sevelamer carbonate (  RENVELA) 800 MG tablet Take 800 mg by mouth 3 (three) times daily. 10/08/23   [provider]  topiramate (TOPAMAX) 50 MG tablet TAKE 1 TABLET BY MOUTH EVERY DAY 12/21/23   [name], Tenna Child, FNP  VELPHORO 500 MG chewable tablet Chew by mouth.    [provider]  fenofibrate 160 MG tablet Take 1 tablet (160 mg total) by mouth daily. 12/31/20 01/20/21  Alba Cory, MD    Physical Exam: Vitals:   02/20/24 1051 02/20/24 1058 02/20/24 1058  BP:  125/81 125/81  Pulse:  (!) 52 (!) 52  Resp:  18 18  Temp:  99.4 F (37.4 C) 99.4 F (37.4 C)  TempSrc:  Oral Oral  SpO2:   97%  Weight: 66.2 kg      Constitutional: NAD, calm, comfortable Vitals:   02/20/24 1051 02/20/24 1058 02/20/24 1058  BP:  125/81 125/81  Pulse:  (!) 52 (!) 52  Resp:  18 18  Temp:  99.4 F (37.4 C) 99.4 F (37.4 C)  TempSrc:  Oral Oral  SpO2:   97%  Weight: 66.2 kg     Eyes: lids and conjunctivae normal Neck: normal, supple Respiratory: clear to auscultation bilaterally. Normal respiratory effort. No accessory muscle use.  Cardiovascular: Regular rate and rhythm, no murmurs. Abdomen: no  tenderness, moderate distention. Bowel sounds positive.  Musculoskeletal: Left upper extremity edema at fistula site Skin: no rashes, lesions, ulcers.  Psychiatric: Flat affect  Labs on Admission: I have personally reviewed following labs and imaging studies  CBC: Recent Labs  Lab 02/15/24 0904 02/20/24 1055  WBC 3.9* 4.0  NEUTROABS 2.7 3.0  HGB 9.6* 10.0*  HCT 31.2* 32.4*  MCV 99.7 97.3  PLT 106* 87*   Basic Metabolic Panel: Recent Labs  Lab 02/15/24 0904 02/20/24 1055  NA 141 140  K 5.3* 5.4*  CL 97* 95*  CO2 26 25  GLUCOSE 96 102*  BUN 83* 76*  CREATININE 14.37* 14.87*  CALCIUM 9.3 9.2   GFR: Estimated Creatinine Clearance: 4.9 mL/min (A) (by C-G formula based on SCr of 14.87 mg/dL (H)). Liver Function Tests: Recent Labs  Lab 02/20/24 1055  AST 15  ALT 12  ALKPHOS 148*  BILITOT 0.9  PROT 6.4*  ALBUMIN 3.0*   No results for input(s): "LIPASE", "AMYLASE" in the last 168 hours. No results for input(s): "AMMONIA" in the last 168 hours. Coagulation Profile: No results for input(s): "INR", "PROTIME" in the last 168 hours. Cardiac Enzymes: No results for input(s): "CKTOTAL", "CKMB", "CKMBINDEX", "TROPONINI" in the last 168 hours. BNP (last 3 results) No results for input(s): "PROBNP" in the last 8760 hours. HbA1C: No results for input(s): "HGBA1C" in the last 72 hours. CBG: No results for input(s): "GLUCAP" in the last 168 hours. Lipid Profile: No results for input(s): "CHOL", "HDL", "LDLCALC", "TRIG", "CHOLHDL", "LDLDIRECT" in the last 72 hours. Thyroid Function Tests: No results for input(s): "TSH", "T4TOTAL", "FREET4", "T3FREE", "THYROIDAB" in the last 72 hours. Anemia Panel: No results for input(s): "VITAMINB12", "FOLATE", "FERRITIN", "TIBC", "IRON", "RETICCTPCT" in the last 72 hours. Urine analysis:    Component Value Date/Time   COLORURINE YELLOW 07/05/2023 1146   APPEARANCEUR Clear 01/29/2024 1022   LABSPEC 1.013 07/05/2023 1146   PHURINE 8.0  07/05/2023 1146   GLUCOSEU Negative 01/29/2024 1022   HGBUR NEGATIVE 07/05/2023 1146   BILIRUBINUR Negative 01/29/2024 1022   KETONESUR NEGATIVE 07/05/2023 1146   PROTEINUR Negative 01/29/2024 1022   PROTEINUR >=300 (A) 07/05/2023 1146   UROBILINOGEN  0.2 10/17/2020 1026   NITRITE Negative 01/29/2024 1022   NITRITE NEGATIVE 07/05/2023 1146   LEUKOCYTESUR Negative 01/29/2024 1022   LEUKOCYTESUR SMALL (A) 07/05/2023 1146    Radiological Exams on Admission: DG Chest 2 View Result Date: 02/20/2024 CLINICAL DATA:  Shortness of breath with left arm swelling and pain EXAM: CHEST - 2 VIEW COMPARISON:  Chest radiograph dated 02/01/2024 FINDINGS: Patient is rotated slightly to the right. Normal lung volumes. Increased diffuse bilateral interstitial and perihilar pulmonary vasculature. Blunting of the bilateral costophrenic angles. No pneumothorax. Similar markedly enlarged cardiomediastinal silhouette. No acute osseous abnormality. IMPRESSION: Cardiomegaly with pulmonary edema and small bilateral pleural effusions. Electronically Signed   By: Agustin Cree M.D.   On: 02/20/2024 11:57    EKG: Independently reviewed.  SR 54 bpm.  Assessment/Plan Principal Problem:   Cellulitis Active Problems:   ESRD (end stage renal disease) (HCC)   Benign essential HTN   Chronic combined systolic and diastolic congestive heart failure (HCC)   Cirrhosis of liver with ascites (HCC)   MDD (major depressive disorder)   Anemia   Lupus nephritis (HCC)   Anemia due to chronic kidney disease   Acute hyperkalemia   Gastroesophageal reflux disease   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 cor pulmonale (HCC)   Anxiety and depression   Vaginal discharge    Left upper extremity cellulitis -Continue on Rocephin for now empirically -Follow blood cultures -Transfer to [facility] for further nephrology evaluation potential need for vascular evaluation given left arm fistula issues  Acute on chronic systolic and  diastolic CHF -LVEF 35-40% on 2D echocardiogram in 2023 with grade 3 diastolic dysfunction -Plan for hemodialysis upon arrival to [facility]  ESRD on HD with noncompliance and noted hyperkalemia -Dialysis per nephrology upon arrival to [facility] -Give 1 dose Lokelma -No significant findings on EKG, monitor on telemetry  Recent diagnosis of trichomonas and bacterial vaginitis -Flagyl 500 mg twice daily for 7 days  Benign essential hypertension -Continue home blood pressure medications  Anxiety/depression -Continue home medications  Chronic abdominal pain -In the setting of ascites and liver cirrhosis as noted below  History of PE -No longer on Eliquis and completed this last year  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 Juel Burrow Admission status: Inpatient, telemetry  Severity of Illness: The appropriate patient status for this patient is INPATIENT. Inpatient status is judged to be reasonable and necessary in order to provide the required intensity of service to ensure the patient's safety. The patient's presenting symptoms, physical exam findings, and initial radiographic and laboratory data in the context of their chronic comorbidities is felt to place them at high risk for further clinical deterioration. Furthermore, it is not anticipated that the patient will be medically stable for discharge from the hospital within 2 midnights of admission.   * I certify that at the point of admission it is my clinical judgment that the patient will require inpatient hospital care spanning beyond 2 midnights from the point of admission due to high intensity of service, high risk for further deterioration and high frequency of  surveillance required.*   Norbert Malkin D Ciella Obi DO [facility]  If 7PM-7AM, please contact night-coverage [email or web address]  02/20/2024, 1:59 PM

## 2024-02-20 NOTE — Plan of Care (Signed)

## 2024-02-20 NOTE — ED Triage Notes (Addendum)
 Here by POV from home for L arm swelling and pain. Onset since HD on Thursday 9 days ago.  Gradually progressively worse. HD T, TH, S. Last HD was Tuesday, missed Thursday. Concern for L arm infection. Nephrologist mentioned catheter in chest to give AVG/ L arm a break. "Needs HD". Alert, NAD, calm, interactive. Endorses some sob. Denies fever, or other sx.

## 2024-02-21 ENCOUNTER — Encounter (HOSPITAL_COMMUNITY): Payer: Self-pay | Admitting: Internal Medicine

## 2024-02-21 DIAGNOSIS — T82898A Other specified complication of vascular prosthetic devices, implants and grafts, initial encounter: Secondary | ICD-10-CM | POA: Diagnosis not present

## 2024-02-21 DIAGNOSIS — N185 Chronic kidney disease, stage 5: Secondary | ICD-10-CM | POA: Diagnosis not present

## 2024-02-21 DIAGNOSIS — L03114 Cellulitis of left upper limb: Secondary | ICD-10-CM | POA: Diagnosis not present

## 2024-02-21 LAB — BASIC METABOLIC PANEL
Anion gap: 18 — ABNORMAL HIGH (ref 5–15)
BUN: 84 mg/dL — ABNORMAL HIGH (ref 6–20)
CO2: 24 mmol/L (ref 22–32)
Calcium: 9.1 mg/dL (ref 8.9–10.3)
Chloride: 97 mmol/L — ABNORMAL LOW (ref 98–111)
Creatinine, Ser: 15.71 mg/dL — ABNORMAL HIGH (ref 0.44–1.00)
GFR, Estimated: 3 mL/min — ABNORMAL LOW (ref >60)
Glucose, Bld: 88 mg/dL (ref 70–99)
Potassium: 5.1 mmol/L (ref 3.5–5.1)
Sodium: 139 mmol/L (ref 135–145)

## 2024-02-21 LAB — CBC
HCT: 33.3 % — ABNORMAL LOW (ref 36.0–46.0)
Hemoglobin: 10.5 g/dL — ABNORMAL LOW (ref 12.0–15.0)
MCH: 30.5 pg (ref 26.0–34.0)
MCHC: 31.5 g/dL (ref 30.0–36.0)
MCV: 96.8 fL (ref 80.0–100.0)
Platelets: 80 10*3/uL — ABNORMAL LOW (ref 150–400)
RBC: 3.44 MIL/uL — ABNORMAL LOW (ref 3.87–5.11)
RDW: 18.4 % — ABNORMAL HIGH (ref 11.5–15.5)
WBC: 3.9 10*3/uL — ABNORMAL LOW (ref 4.0–10.5)
nRBC: 0 % (ref 0.0–0.2)

## 2024-02-21 LAB — HEPATITIS B SURFACE ANTIGEN: Hepatitis B Surface Ag: NONREACTIVE

## 2024-02-21 LAB — MAGNESIUM: Magnesium: 2.4 mg/dL (ref 1.7–2.4)

## 2024-02-21 MED ORDER — CAMPHOR-MENTHOL 0.5-0.5 % EX LOTN
TOPICAL_LOTION | CUTANEOUS | Status: DC | PRN
  Filled 2024-02-21: qty 222

## 2024-02-21 MED ORDER — METHOCARBAMOL 500 MG PO TABS
250.0000 mg | ORAL_TABLET | Freq: Three times a day (TID) | ORAL | Status: DC | PRN
  Administered 2024-02-21 – 2024-02-22 (×2): 250 mg via ORAL
  Filled 2024-02-21 (×2): qty 1

## 2024-02-21 MED ORDER — HYDROXYZINE HCL 10 MG PO TABS
10.0000 mg | ORAL_TABLET | Freq: Three times a day (TID) | ORAL | Status: AC
  Administered 2024-02-21 – 2024-02-23 (×6): 10 mg via ORAL
  Filled 2024-02-21 (×6): qty 1

## 2024-02-21 MED ORDER — CHLORHEXIDINE GLUCONATE CLOTH 2 % EX PADS
6.0000 | MEDICATED_PAD | Freq: Every day | CUTANEOUS | Status: DC
  Administered 2024-02-23: 6 via TOPICAL

## 2024-02-21 MED ORDER — CHLORHEXIDINE GLUCONATE CLOTH 2 % EX PADS: 6.0000 | MEDICATED_PAD | Freq: Every day | CUTANEOUS | Status: DC

## 2024-02-21 NOTE — Progress Notes (Signed)
 [facility] Progress Note Patient: Cathy [name] ZOX:[id number] DOB: 1981-02-18 DOA: 02/20/2024  DOS: the patient was seen and examined on 02/21/2024  Brief Hospital Course: PMH of ESRD on HD, combined CHF, HTN, HLD, NASH, bipolar disorder, GERD, PE present to the hospital with numbness of left upper extremity swelling and pain. Currently being treated for cellulitis and venous outflow obstruction. Initially admitted to [facility], brought to [facility] for vascular surgery evaluation as well as HD. Nephrology and vascular surgery following. Assessment and Plan: Left upper extremity cellulitis. Currently on IV antibiotic. Blood cultures performed. Will follow.  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 Vascular surgery consulted. Patient will have fistulogram tomorrow. TDC will be placed tomorrow as well. Appreciate assistance. Continue pain control.  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 Missed HD secondary to malfunctioning fistula. HD tomorrow after [facility] placement.  ESRD on HD. TTS schedule. Nephrology consulted. HD tomorrow.  HTN. Blood pressure stable. Will continue the same medication regimen for now.  Anemia of CKD ESRD. Monitor H&H for now.  History of [place] cirrhosis. Volume status adequate.  No evidence of encephalopathy.  Monitor for now.  COPD. Continue inhalers.  Anxiety depression and bipolar disorder. Currently stable.  Will continue home medication.  Recent diagnosis of bacterial vaginitis as well as trichomonas infection. On Flagyl twice daily for 7 days.      Subjective: Continues to have a complaint of left arm pain.  No nausea no vomiting.  Improving and redness reported by the patient.  Physical Exam: General: in severe distress, No Rash Cardiovascular: S1 and S2 Present, No Murmur Respiratory: Good respiratory effort, Bilateral Air entry present. No Crackles, No wheezes Abdomen: Bowel Sound present, No tenderness Extremities: Left upper extremity  edema Neuro: Alert and oriented x3, no new focal deficit  Data Reviewed: I have Reviewed nursing notes, Vitals, and Lab results. Since last encounter, pertinent lab results CBC and BMP   . I have ordered test including CBC and BMP  .   Disposition: Status is: Inpatient Remains inpatient appropriate because: Will require TDC placement hemodialysis as well as vascular surgery procedure  heparin injection 5,000 Units Start: 02/20/24 2200   Family Communication: No one at bedside Level of care: Telemetry Medical   Vitals:   02/20/24 2251 02/21/24 0512 02/21/24 0851 02/21/24 1519  BP: (!) 173/96 (!) 145/94 132/81 (!) 153/96  Pulse:  (!) 51 (!) 53   Resp:   18 20  Temp:  98 F (36.7 C) 98 F (36.7 C) 98.5 F (36.9 C)  TempSrc:  Oral Oral Oral  SpO2:  100% 100% 100%  Weight:         Author: Lynden Oxford, MD 02/21/2024 7:48 PM  Please look on [email or web address] to find out who is on call.

## 2024-02-21 NOTE — Consult Note (Signed)
 Renal Service Consult Note [facility]  Cathy [name] 02/21/2024 Cathy Krabbe, MD Requesting Physician: Dr Allena Katz.  Reason for Consult: ESRD patient with painful, swollen left upper arm HPI: The patient is a 43 y.o. year-old w/ PMH as below who presented to emergency room due to significant swelling of her left arm and pain.  The patient had infiltration of the fistula in the left upper arm last Tuesday 3/18, and the pain and swelling has not improved.  She is not in favor of trying to use the fistula due to severe pain.  Patient was admitted and given IV antibiotics for some erythema and cellulitis. We are asked to see for dialysis.   Pt seen in hospital room.  History as above.  She has prior history of ascites which has recurred recently, she has never been told she has cirrhosis as of yet per the patient.  Patient lives at home, her sister lives with her, she takes public transportation to dialysis.   PMH Anxiety Bipolar depression ESRD on HD History of lupus History of PE Chronic combined CHF    ROS - denies CP, no joint pain, no HA, no blurry vision, no rash, no diarrhea, no nausea/ vomiting   Past Medical History  Past Medical History:  Diagnosis Date   Anasarca associated with disorder of kidney 06/08/2021   Anxiety    Asthma    Bipolar depression (HCC)    Depression    Dyspnea    ESRD (end stage renal disease) on dialysis (HCC) 10/2020   GERD (gastroesophageal reflux disease)    Gonorrhea    Lupus    Pelvic inflammatory disease (PID)    Pleural effusion 06/08/2021   Pulmonary embolism (HCC) 01/31/2022   Renal hypertension    Trichomonas infection    Past Surgical History  Past Surgical History:  Procedure Laterality Date   AV FISTULA PLACEMENT Left 06/10/2022   Procedure: LEFT ARM ARTERIOVENOUS (AV) FISTULA CREATION;  Surgeon: Larina Earthly, MD;  Location: [facility] ORS;  Service: Vascular;  Laterality: Left;   BASCILIC VEIN TRANSPOSITION Left  03/24/2023   Procedure: LEFT ARM SECOND STAGE BASILIC VEIN TRANSPOSITION;  Surgeon: Larina Earthly, MD;  Location: [facility] ORS;  Service: Vascular;  Laterality: Left;   IR FLUORO GUIDE CV LINE RIGHT  11/30/2020   IR FLUORO GUIDE CV LINE RIGHT  06/13/2021   IR FLUORO GUIDE CV LINE RIGHT  02/01/2022   IR REMOVAL TUN CV CATH W/O FL  06/11/2021   IR REMOVAL TUN CV CATH W/O FL  01/29/2022   IR US GUIDE VASC ACCESS RIGHT  11/30/2020   IR US GUIDE VASC ACCESS RIGHT  06/13/2021   IR US GUIDE VASC ACCESS RIGHT  02/03/2022   RENAL BIOPSY     TEE WITHOUT CARDIOVERSION N/A 06/13/2021   Procedure: TRANSESOPHAGEAL ECHOCARDIOGRAM (TEE);  Surgeon: Wendall Stade, MD;  Location: [facility] ENDOSCOPY;  Service: Cardiovascular;  Laterality: N/A;   TEE WITHOUT CARDIOVERSION N/A 02/03/2022   Procedure: TRANSESOPHAGEAL ECHOCARDIOGRAM (TEE);  Surgeon: Thomasene Ripple, DO;  Location: [facility] ENDOSCOPY;  Service: Cardiovascular;  Laterality: N/A;   TUBAL LIGATION N/A 02/03/2019   Procedure: POST PARTUM TUBAL LIGATION;  Surgeon: Tea Bing, MD;  Location: [facility] LD ORS;  Service: Gynecology;  Laterality: N/A;   Family History  Family History  Problem Relation Age of Onset   Anxiety disorder Mother    Sleep apnea Father    Diabetes Maternal Grandmother    Hypertension Maternal Grandmother  Diabetes Maternal Grandfather    Hypertension Maternal Grandfather    Diabetes Paternal Grandmother    Hypertension Paternal Grandmother    Diabetes Paternal Grandfather    Hypertension Paternal Grandfather    Social History  reports that she has been smoking cigarettes. She started smoking about 25 years ago. She has a 6.3 pack-year smoking history. She has never used smokeless tobacco. She reports that she does not currently use alcohol. She reports current drug use. Frequency: 3.00 times per week. Drug: Marijuana. Allergies  Allergies  Allergen Reactions   Dialyvite 800 [Nephro-Vite] Nausea And Vomiting   Home medications Prior to Admission  medications   Medication Sig Start Date End Date Taking? Authorizing Provider  acetaminophen (TYLENOL) 500 MG tablet Take 1,000 mg by mouth every 6 (six) hours as needed.   Yes [provider]  albuterol (VENTOLIN HFA) 108 (90 Base) MCG/ACT inhaler Inhale 2 puffs into the lungs every 6 (six) hours as needed for wheezing or shortness of breath. 12/18/23  Yes [name], Tenna Child, FNP  amLODipine (NORVASC) 10 MG tablet Take 1 tablet (10 mg total) by mouth in the morning. 08/27/23  Yes [name], [place], FNP  budesonide-formoterol (SYMBICORT) 160-4.5 MCG/ACT inhaler Inhale 2 puffs into the lungs 2 (two) times daily. 12/18/23  Yes [name], Tenna Child, FNP  busPIRone (BUSPAR) 30 MG tablet TAKE 1 TABLET (30 MG TOTAL) BY MOUTH IN THE MORNING AND AT BEDTIME. 01/11/24  Yes [name], [place], FNP  carvedilol (COREG) 25 MG tablet Take 1 tablet (25 mg total) by mouth 2 (two) times daily with a meal. 08/27/23  Yes [name], [place], FNP  cloNIDine (CATAPRES) 0.3 MG tablet Take 1 tablet (0.3 mg total) by mouth 3 (three) times daily. 08/27/23  Yes [name], Tenna Child, FNP  hydrALAZINE (APRESOLINE) 100 MG tablet Take 1 tablet (100 mg total) by mouth 3 (three) times daily. 08/27/23  Yes [name], Tenna Child, FNP  losartan (COZAAR) 50 MG tablet Take 50 mg by mouth at bedtime. 01/27/23  Yes [provider]  ondansetron (ZOFRAN) 4 MG tablet Take 1 tablet (4 mg total) by mouth every 8 (eight) hours as needed for nausea or vomiting. 09/30/23  Yes [name], Tenna Child, FNP  pantoprazole (PROTONIX) 40 MG tablet Take 1 tablet (40 mg total) by mouth daily before breakfast. 08/27/23 02/20/24 Yes [name], Tenna Child, FNP  propranolol (INDERAL) 10 MG tablet TAKE 1 TABLET BY MOUTH EVERY 12 HOURS AS NEEDED. 02/15/24  Yes [name], Tenna Child, FNP  sevelamer carbonate (RENVELA) 800 MG tablet Take 800 mg by mouth 3 (three) times daily. 10/08/23  Yes [provider]   topiramate (TOPAMAX) 50 MG tablet TAKE 1 TABLET BY MOUTH EVERY DAY 12/21/23  Yes [name], [place], [place]  Blood Pressure Monitoring (OMRON 3 SERIES BP MONITOR) DEVI Use as directed 2 times a day 08/27/23   Del Nigel Berthold, FNP  Doxercalciferol (HECTOROL IV) Doxercalciferol (Hectorol) 02/09/24 02/07/25  [provider]  Methoxy PEG-Epoetin Beta (MIRCERA IJ)  01/12/24 01/10/25  [provider]  fenofibrate 160 MG tablet Take 1 tablet (160 mg total) by mouth daily. 12/31/20 01/20/21  Regalado, Jon Billings A, MD     Vitals:   02/20/24 2031 02/20/24 2251 02/21/24 0512 02/21/24 0851  BP: (!) 143/95 (!) 173/96 (!) 145/94 132/81  Pulse: (!) 54  (!) 51 (!) 53  Resp: 16   18  Temp: 98.2 F (36.8 C)  98 F (  36.7 C) 98 F (36.7 C)  TempSrc:   Oral Oral  SpO2: 97%  100% 100%  Weight:       Exam Gen alert, no distress No rash, cyanosis or gangrene Sclera anicteric, throat clear  No jvd or bruits Chest clear bilat to bases, no rales/ wheezing RRR no MRG Abd soft ntnd no mass or ascites +bs GU defer MS left upper and lower arm is edematous 2+ and painful to touch Ext no LE edema Neuro is alert, Ox 3 , nf  LUA AVF+bruit     Renal-related home meds: Norvasc 10 mg Coreg 25 mg twice daily Clonidine 0.3 mg 3 times daily Hydralazine 100 3 times daily Losartan 50 nightly Renvela 800 mg AC 3 times daily Propranolol 10 mg twice daily    OP HD: TTS [facility]  From aug 2024 --> 4h  400/800   2k bath  Heparin ?    Assessment/ Plan: Swelling/ cellulitis LUA: the AVF infiltrated Tuesday last week, and the whole arm remains to be swollen and painful to touch. Redness improved per pt. Pt is not amenable to trying AVF. Will need TDC. Have d/w [facility]VS, plan is for [facility] tomorrow.  Hyperkalemia - mild 5.5, responding to lokelma, 5.1 today.  ESRD: on HD TTS, last HD 3/18. Pt w/o urgent need for HD. Next HD tomorrow after access established.  HTN: bp's good, cont home meds (is on  4-5) Volume: no vol excess on exam, on RA, no LE edema. Follow. Get OP records.   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  Secondary hyperparathyroidism: CCa in range, add on phos and cont binders ac.  Cirrhosis/ ascites - per pmd      Vinson Moselle  MD [facility] 02/21/2024, 10:33 AM  Recent Labs  Lab 02/20/24 1055 02/21/24 0527  HGB 10.0* 10.5*  ALBUMIN 3.0*  --   CALCIUM 9.2 9.1  CREATININE 14.87* 15.71*  K 5.4* 5.1   Inpatient medications:  amLODipine  10 mg Oral Daily   busPIRone  30 mg Oral BID   carvedilol  25 mg Oral BID WC   cloNIDine  0.3 mg Oral TID   heparin  5,000 Units Subcutaneous Q8H   hydrALAZINE  100 mg Oral TID   hydrOXYzine  10 mg Oral TID   losartan  50 mg Oral QHS   metroNIDAZOLE  500 mg Oral Q12H   pantoprazole  40 mg Oral QAC breakfast   sevelamer carbonate  800 mg Oral TID WC   sodium zirconium cyclosilicate  10 g Oral TID   topiramate  50 mg Oral Daily    cefTRIAXone (ROCEPHIN)  IV 2 g (02/21/24 0827)   acetaminophen **OR** acetaminophen, camphor-menthol, HYDROmorphone (DILAUDID) injection, ondansetron **OR** ondansetron (ZOFRAN) IV, oxyCODONE

## 2024-02-21 NOTE — H&P (Signed)
 Hospital Consult    Reason for Consult: Need for temporary dialysis access, swelling left upper extremity Referring Physician: Dr. Arlean Hopping MRN #:  [id number]  History of Present Illness: This is a 43 y.o. female history of end-stage renal disease was dialyzing via left upper arm AV fistula.  She was evaluated in our office 2 days ago for left upper extremity swelling around her basilic vein fistula.  She subsequently had an infiltration event at dialysis and now has not had dialysis since the 18th.  We are now consulted for placement of tunneled catheter and to evaluate for left upper extremity swelling.  She does not take any blood thinners.  Past Medical History:  Diagnosis Date   Anasarca associated with disorder of kidney 06/08/2021   Anxiety    Asthma    Bipolar depression (HCC)    Depression    Dyspnea    ESRD (end stage renal disease) on dialysis (HCC) 10/2020   GERD (gastroesophageal reflux disease)    Gonorrhea    Lupus    Pelvic inflammatory disease (PID)    Pleural effusion 06/08/2021   Pulmonary embolism (HCC) 01/31/2022   Renal hypertension    Trichomonas infection     Past Surgical History:  Procedure Laterality Date   AV FISTULA PLACEMENT Left 06/10/2022   Procedure: LEFT ARM ARTERIOVENOUS (AV) FISTULA CREATION;  Surgeon: Larina Earthly, MD;  Location: [facility];  Service: Vascular;  Laterality: Left;   BASCILIC VEIN TRANSPOSITION Left 03/24/2023   Procedure: LEFT ARM SECOND STAGE BASILIC VEIN TRANSPOSITION;  Surgeon: Larina Earthly, MD;  Location: [facility];  Service: Vascular;  Laterality: Left;   IR FLUORO GUIDE CV LINE RIGHT  11/30/2020   IR FLUORO GUIDE CV LINE RIGHT  06/13/2021   IR FLUORO GUIDE CV LINE RIGHT  02/01/2022   IR REMOVAL TUN CV CATH W/O FL  06/11/2021   IR REMOVAL TUN CV CATH W/O FL  01/29/2022   IR US GUIDE VASC ACCESS RIGHT  11/30/2020   IR US GUIDE VASC ACCESS RIGHT  06/13/2021   IR US GUIDE VASC ACCESS RIGHT  02/03/2022   RENAL BIOPSY     TEE WITHOUT  CARDIOVERSION N/A 06/13/2021   Procedure: TRANSESOPHAGEAL ECHOCARDIOGRAM (TEE);  Surgeon: Wendall Stade, MD;  Location: [facility] ENDOSCOPY;  Service: Cardiovascular;  Laterality: N/A;   TEE WITHOUT CARDIOVERSION N/A 02/03/2022   Procedure: TRANSESOPHAGEAL ECHOCARDIOGRAM (TEE);  Surgeon: Thomasene Ripple, DO;  Location: [facility] ENDOSCOPY;  Service: Cardiovascular;  Laterality: N/A;   TUBAL LIGATION N/A 02/03/2019   Procedure: POST PARTUM TUBAL LIGATION;  Surgeon: Cresson Bing, MD;  Location: [facility] LD ORS;  Service: Gynecology;  Laterality: N/A;    Allergies  Allergen Reactions   Dialyvite 800 [Nephro-Vite] Nausea And Vomiting    Prior to Admission medications   Medication Sig Start Date End Date Taking? Authorizing Provider  acetaminophen (TYLENOL) 500 MG tablet Take 1,000 mg by mouth every 6 (six) hours as needed.   Yes [provider]  albuterol (VENTOLIN HFA) 108 (90 Base) MCG/ACT inhaler Inhale 2 puffs into the lungs every 6 (six) hours as needed for wheezing or shortness of breath. 12/18/23  Yes [name], Tenna Child, FNP  amLODipine (NORVASC) 10 MG tablet Take 1 tablet (10 mg total) by mouth in the morning. 08/27/23  Yes [name], [place], FNP  budesonide-formoterol (SYMBICORT) 160-4.5 MCG/ACT inhaler Inhale 2 puffs into the lungs 2 (two) times daily. 12/18/23  Yes [name], Tenna Child, FNP  busPIRone (BUSPAR) 30 MG  tablet TAKE 1 TABLET (30 MG TOTAL) BY MOUTH IN THE MORNING AND AT BEDTIME. 01/11/24  Yes [name], [place], FNP  carvedilol (COREG) 25 MG tablet Take 1 tablet (25 mg total) by mouth 2 (two) times daily with a meal. 08/27/23  Yes [name], [place], FNP  cloNIDine (CATAPRES) 0.3 MG tablet Take 1 tablet (0.3 mg total) by mouth 3 (three) times daily. 08/27/23  Yes [name], Tenna Child, FNP  hydrALAZINE (APRESOLINE) 100 MG tablet Take 1 tablet (100 mg total) by mouth 3 (three) times daily. 08/27/23  Yes [name], Tenna Child, FNP  losartan (COZAAR) 50 MG tablet  Take 50 mg by mouth at bedtime. 01/27/23  Yes [provider]  ondansetron (ZOFRAN) 4 MG tablet Take 1 tablet (4 mg total) by mouth every 8 (eight) hours as needed for nausea or vomiting. 09/30/23  Yes [name], Tenna Child, FNP  pantoprazole (PROTONIX) 40 MG tablet Take 1 tablet (40 mg total) by mouth daily before breakfast. 08/27/23 02/20/24 Yes [name], Tenna Child, FNP  propranolol (INDERAL) 10 MG tablet TAKE 1 TABLET BY MOUTH EVERY 12 HOURS AS NEEDED. 02/15/24  Yes [name], Tenna Child, FNP  sevelamer carbonate (RENVELA) 800 MG tablet Take 800 mg by mouth 3 (three) times daily. 10/08/23  Yes [provider]  topiramate (TOPAMAX) 50 MG tablet TAKE 1 TABLET BY MOUTH EVERY DAY 12/21/23  Yes [name], [place], [place]  Blood Pressure Monitoring (OMRON 3 SERIES BP MONITOR) DEVI Use as directed 2 times a day 08/27/23   Del Nigel Berthold, FNP  Doxercalciferol (HECTOROL IV) Doxercalciferol (Hectorol) 02/09/24 02/07/25  [provider]  Methoxy PEG-Epoetin Beta (MIRCERA IJ)  01/12/24 01/10/25  [provider]  fenofibrate 160 MG tablet Take 1 tablet (160 mg total) by mouth daily. 12/31/20 01/20/21  Regalado, Prentiss Bells, MD    Social History   Socioeconomic History   Marital status: Single    Spouse name: Not on file   Number of children: Not on file   Years of education: Not on file   Highest education level: Not on file  Occupational History   Occupation: unemployed  Tobacco Use   Smoking status: Every Day    Current packs/day: 0.25    Average packs/day: 0.3 packs/day for 25.2 years (6.3 ttl pk-yrs)    Types: Cigarettes    Start date: 2000   Smokeless tobacco: Never   Tobacco comments:    1-2 cigarettes daily   Vaping Use   Vaping status: Never Used  Substance and Sexual Activity   Alcohol use: Not Currently   Drug use: Yes    Frequency: 3.0 times per week    Types: Marijuana   Sexual activity: Not Currently    Birth control/protection:  None  Other Topics Concern   Not on file  Social History Narrative   Not on file   Social Drivers of Health   Financial Resource Strain: Low Risk  (02/18/2024)   Overall Financial Resource Strain (CARDIA)    Difficulty of Paying Living Expenses: Not very hard  Food Insecurity: No Food Insecurity (02/20/2024)   Hunger Vital Sign    Worried About Running Out of Food in the Last Year: Never true    Ran Out of Food in the Last Year: Never true  Transportation Needs: No Transportation Needs (02/20/2024)   PRAPARE - Administrator, Civil Service (Medical): No    Lack of Transportation (Non-Medical): No  Physical Activity:  Inactive (02/18/2024)   Exercise Vital Sign    Days of Exercise per Week: 1 day    Minutes of Exercise per Session: 0 min  Stress: Stress Concern Present (02/18/2024)   [employer] of Occupational Health - Occupational Stress Questionnaire    Feeling of Stress : To some extent  Social Connections: Socially Isolated (02/18/2024)   Social Connection and Isolation Panel [NHANES]    Frequency of Communication with Friends and Family: More than three times a week    Frequency of Social Gatherings with Friends and Family: More than three times a week    Attends Religious Services: Never    Database administrator or Organizations: No    Attends Banker Meetings: Never    Marital Status: Never married  Intimate Partner Violence: Not At Risk (02/20/2024)   Humiliation, Afraid, Rape, and Kick questionnaire    Fear of Current or Ex-Partner: No    Emotionally Abused: No    Physically Abused: No    Sexually Abused: No     Family History  Problem Relation Age of Onset   Anxiety disorder Mother    Sleep apnea Father    Diabetes Maternal Grandmother    Hypertension Maternal Grandmother    Diabetes Maternal Grandfather    Hypertension Maternal Grandfather    Diabetes Paternal Grandmother    Hypertension Paternal Grandmother    Diabetes Paternal  Grandfather    Hypertension Paternal Grandfather     Review of Systems  Constitutional: Negative.   HENT: Negative.    Eyes: Negative.   Respiratory: Negative.    Cardiovascular: Negative.   Gastrointestinal:  Positive for abdominal pain.  Musculoskeletal:        Left upper extremity swelling and pain  Skin: Negative.   Neurological: Negative.   Endo/Heme/Allergies: Negative.   Psychiatric/Behavioral: Negative.        Physical Examination  Vitals:   02/21/24 0851 02/21/24 1519  BP: 132/81 (!) 153/96  Pulse: (!) 53   Resp: 18 20  Temp: 98 F (36.7 C) 98.5 F (36.9 C)  SpO2: 100% 100%   Body mass index is 22.87 kg/m.  Physical Exam HENT:     Head: Normocephalic.     Mouth/Throat:     Mouth: Mucous membranes are moist.  Cardiovascular:     Pulses:          Radial pulses are 1+ on the right side.  Abdominal:     General: There is distension.  Musculoskeletal:     Comments: Swelling throughout the left upper extremity with significant tenderness to palpation throughout Strong thrill mid left upper arm  Skin:    Capillary Refill: Capillary refill takes less than 2 seconds.  Neurological:     Mental Status: She is alert.  Psychiatric:        Mood and Affect: Mood normal.      CBC    Component Value Date/Time   WBC 3.9 (L) 02/21/2024 0527   RBC 3.44 (L) 02/21/2024 0527   HGB 10.5 (L) 02/21/2024 0527   HGB 11.0 (L) 08/27/2023 1004   HCT 33.3 (L) 02/21/2024 0527   HCT 35.1 08/27/2023 1004   PLT 80 (L) 02/21/2024 0527   PLT 217 08/27/2023 1004   MCV 96.8 02/21/2024 0527   MCV 97 08/27/2023 1004   MCH 30.5 02/21/2024 0527   MCHC 31.5 02/21/2024 0527   RDW 18.4 (H) 02/21/2024 0527   RDW 15.2 08/27/2023 1004   LYMPHSABS 0.5 (L) 02/20/2024 1055  LYMPHSABS 1.1 08/27/2023 1004   MONOABS 0.3 02/20/2024 1055   EOSABS 0.1 02/20/2024 1055   EOSABS 0.2 08/27/2023 1004   BASOSABS 0.0 02/20/2024 1055   BASOSABS 0.0 08/27/2023 1004    BMET    Component  Value Date/Time   NA 139 02/21/2024 0527   NA 136 12/10/2018 1142   K 5.1 02/21/2024 0527   CL 97 (L) 02/21/2024 0527   CO2 24 02/21/2024 0527   GLUCOSE 88 02/21/2024 0527   BUN 84 (H) 02/21/2024 0527   BUN 9 12/10/2018 1142   CREATININE 15.71 (H) 02/21/2024 0527   CALCIUM 9.1 02/21/2024 0527   GFRNONAA 3 (L) 02/21/2024 0527   GFRAA >60 08/07/2020 1209    COAGS: Lab Results  Component Value Date   INR 1.2 02/01/2024   INR 1.8 (H) 03/01/2022   INR 2.1 (H) 01/29/2022     Non-Invasive Vascular Imaging:   Left UPPER EXTREMITY VENOUS DOPPLER ULTRASOUND   TECHNIQUE: Gray-scale sonography with graded compression, as well as color Doppler and duplex ultrasound were performed to evaluate the upper extremity deep venous system from the level of the subclavian vein and including the jugular, axillary, basilic, radial, ulnar and upper cephalic vein. Spectral Doppler was utilized to evaluate flow at rest and with distal augmentation maneuvers.   COMPARISON:  None Available.   FINDINGS: Contralateral Subclavian Vein: Respiratory phasicity is normal and symmetric with the symptomatic side. No evidence of thrombus. Normal compressibility. Of note there is some slow flow in the right internal jugular vein.   Internal Jugular Vein: No evidence of thrombus. Normal compressibility, respiratory phasicity and response to augmentation.   Subclavian Vein: No evidence of thrombus. Normal compressibility, respiratory phasicity and response to augmentation.   Axillary Vein: No evidence of thrombus. Normal compressibility, respiratory phasicity and response to augmentation.   Cephalic Vein: No evidence of thrombus. Normal compressibility, respiratory phasicity and response to augmentation.   Basilic Vein: No evidence of thrombus. Normal compressibility, respiratory phasicity and response to augmentation.   Brachial Veins: No evidence of thrombus. Normal compressibility, respiratory  phasicity and response to augmentation.   Radial Veins: No evidence of thrombus. Normal compressibility, respiratory phasicity and response to augmentation.   Ulnar Veins: No evidence of thrombus. Normal compressibility, respiratory phasicity and response to augmentation.   Venous Reflux:  None visualized.   Other Findings: Dialysis fistula the is noted and essentially patent at the arterial anastomosis. The dialysis fistula was not interrogated in detail today.   IMPRESSION: No evidence of left upper extremity DVT.   ASSESSMENT/PLAN: This is a 43 y.o. female with end-stage renal disease now with swelling of her left upper extremity significant tenderness to palpation.  Appears that she has had an infiltration event and likely has a venous outflow obstruction.  Will plan for tunneled dialysis catheter placement and fistulogram tomorrow in the Cath Lab.  She will be n.p.o. past midnight.  I discussed the risk benefits alternatives she demonstrates good understanding.  Samiha Denapoli C. Randie Heinz, MD [facility] [place] Office: [phone] Pager: [phone]

## 2024-02-22 ENCOUNTER — Inpatient Hospital Stay (HOSPITAL_COMMUNITY)
Admission: EM | Disposition: A | Discharge status: Home / Self Care | Payer: Self-pay | Source: Home / Self Care | Attending: Internal Medicine

## 2024-02-22 ENCOUNTER — Telehealth: Payer: Self-pay

## 2024-02-22 DIAGNOSIS — N185 Chronic kidney disease, stage 5: Secondary | ICD-10-CM | POA: Diagnosis not present

## 2024-02-22 DIAGNOSIS — L03114 Cellulitis of left upper limb: Secondary | ICD-10-CM | POA: Diagnosis not present

## 2024-02-22 DIAGNOSIS — T82898A Other specified complication of vascular prosthetic devices, implants and grafts, initial encounter: Secondary | ICD-10-CM

## 2024-02-22 HISTORY — PX: CENTRAL LINE INSERTION-TUNNELED: CATH118291

## 2024-02-22 HISTORY — PX: A/V FISTULAGRAM: CATH118298

## 2024-02-22 LAB — RENAL FUNCTION PANEL
Albumin: 2.8 g/dL — ABNORMAL LOW (ref 3.5–5.0)
Anion gap: 20 — ABNORMAL HIGH (ref 5–15)
BUN: 98 mg/dL — ABNORMAL HIGH (ref 6–20)
CO2: 22 mmol/L (ref 22–32)
Calcium: 9.1 mg/dL (ref 8.9–10.3)
Chloride: 97 mmol/L — ABNORMAL LOW (ref 98–111)
Creatinine, Ser: 17.51 mg/dL — ABNORMAL HIGH (ref 0.44–1.00)
GFR, Estimated: 2 mL/min — ABNORMAL LOW (ref >60)
Glucose, Bld: 77 mg/dL (ref 70–99)
Phosphorus: >30 mg/dL — ABNORMAL HIGH (ref 2.5–4.6)
Potassium: 5.5 mmol/L — ABNORMAL HIGH (ref 3.5–5.1)
Sodium: 139 mmol/L (ref 135–145)

## 2024-02-22 LAB — CBC
HCT: 31 % — ABNORMAL LOW (ref 36.0–46.0)
Hemoglobin: 9.8 g/dL — ABNORMAL LOW (ref 12.0–15.0)
MCH: 29.9 pg (ref 26.0–34.0)
MCHC: 31.6 g/dL (ref 30.0–36.0)
MCV: 94.5 fL (ref 80.0–100.0)
Platelets: 74 10*3/uL — ABNORMAL LOW (ref 150–400)
RBC: 3.28 MIL/uL — ABNORMAL LOW (ref 3.87–5.11)
RDW: 18 % — ABNORMAL HIGH (ref 11.5–15.5)
WBC: 4 10*3/uL (ref 4.0–10.5)
nRBC: 0 % (ref 0.0–0.2)

## 2024-02-22 LAB — CYTOLOGY - PAP
Comment: NEGATIVE
Diagnosis: NEGATIVE
High risk HPV: NEGATIVE

## 2024-02-22 LAB — MAGNESIUM: Magnesium: 2.5 mg/dL — ABNORMAL HIGH (ref 1.7–2.4)

## 2024-02-22 SURGERY — A/V FISTULAGRAM
Anesthesia: LOCAL

## 2024-02-22 MED ORDER — HEPARIN SODIUM (PORCINE) 1000 UNIT/ML IJ SOLN
INTRAMUSCULAR | Status: AC
  Filled 2024-02-22: qty 10

## 2024-02-22 MED ORDER — FENTANYL CITRATE (PF) 100 MCG/2ML IJ SOLN
INTRAMUSCULAR | Status: DC | PRN
  Administered 2024-02-22: 50 ug via INTRAVENOUS

## 2024-02-22 MED ORDER — FENTANYL CITRATE (PF) 100 MCG/2ML IJ SOLN
INTRAMUSCULAR | Status: AC
  Filled 2024-02-22: qty 2

## 2024-02-22 MED ORDER — LIDOCAINE HCL (PF) 1 % IJ SOLN
INTRAMUSCULAR | Status: AC
  Filled 2024-02-22: qty 30

## 2024-02-22 MED ORDER — MIDAZOLAM HCL 2 MG/2ML IJ SOLN
INTRAMUSCULAR | Status: DC | PRN
  Administered 2024-02-22: 1 mg via INTRAVENOUS

## 2024-02-22 MED ORDER — LIDOCAINE HCL (PF) 1 % IJ SOLN
INTRAMUSCULAR | Status: DC | PRN
  Administered 2024-02-22 (×2): 10 mL

## 2024-02-22 MED ORDER — MIDAZOLAM HCL 2 MG/2ML IJ SOLN
INTRAMUSCULAR | Status: AC
  Filled 2024-02-22: qty 2

## 2024-02-22 SURGICAL SUPPLY — 10 items
CATH ANGIO 5F BER2 65CM (CATHETERS) IMPLANT
CATH PALINDROME-P 23 W/VT (CATHETERS) IMPLANT
COVER DOME SNAP 22 D (MISCELLANEOUS) ×2 IMPLANT
KIT MICROPUNCTURE NIT STIFF (SHEATH) IMPLANT
SHEATH PINNACLE R/O II 6F 4CM (SHEATH) IMPLANT
SHEATH PROBE COVER 6X72 (BAG) ×2 IMPLANT
STOPCOCK MORSE 400PSI 3WAY (MISCELLANEOUS) IMPLANT
TRAY PV CATH (CUSTOM PROCEDURE TRAY) ×2 IMPLANT
TUBING CIL FLEX 10 FLL-RA (TUBING) IMPLANT
WIRE BENTSON .035X145CM (WIRE) IMPLANT

## 2024-02-22 NOTE — Progress Notes (Signed)
 [facility] Progress Note Patient: Cathy [name] ZOX:[id number] DOB: 1981/05/10 DOA: 02/20/2024  DOS: the patient was seen and examined on 02/22/2024  Brief Hospital Course: PMH of ESRD on HD, combined CHF, HTN, HLD, NASH, bipolar disorder, GERD, PE present to the hospital with numbness of left upper extremity swelling and pain. Currently being treated for cellulitis and venous outflow obstruction. Initially admitted to [facility], brought to [facility] for vascular surgery evaluation as well as HD. Nephrology and vascular surgery following.  Assessment and Plan: Left upper extremity cellulitis. Currently on IV antibiotic. So far no bacteremia. Would like to switch to p.o. antibiotics tomorrow.  Fistula malfunction. Vascular surgery consulted. Likely after infiltration. Fistulogram shows evidence of patent fistula. TDC placed. Continue pain control.   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 Missed HD secondary to malfunctioning fistula. HD tomorrow after [facility] placement.   ESRD on HD. TTS schedule. Nephrology consulted. HD today and tomorrow.   HTN. Blood pressure stable. Will continue the same medication regimen for now.   Anemia of CKD ESRD. Monitor H&H for now.   History of Nash cirrhosis. No evidence of encephalopathy.  Monitor for now.   COPD. Continue inhalers.   Anxiety depression and bipolar disorder. Currently stable.  Will continue home medication.   Recent diagnosis of bacterial vaginitis as well as trichomonas infection. On Flagyl twice daily for 7 days.   Subjective: No nausea no vomiting no fever no chills.  Ongoing pain reported.  Physical Exam: General: in Mild distress, No Rash Cardiovascular: S1 and S2 Present, No Murmur Respiratory: Good respiratory effort, Bilateral Air entry present. No Crackles, No wheezes Abdomen: Bowel Sound present, No tenderness Extremities: No edema in legs, left upper extremity swelling seen. Neuro: Alert and  oriented x3, no new focal deficit  Data Reviewed: I have Reviewed nursing notes, Vitals, and Lab results. Since last encounter, pertinent lab results CBC and BMP   . I have ordered test including CBC and BMP  .  Discussed with nephrology. Unresulted Labs (From admission, onward)     Start     Ordered   02/22/24 0500  CBC  Daily,   R     Question:  Specimen collection method  Answer:  Lab=Lab collect   02/21/24 2348   02/22/24 0500  Magnesium  Daily,   R     Question:  Specimen collection method  Answer:  Lab=Lab collect   02/21/24 2348   02/22/24 0500  Renal function panel  Daily,   R     Question:  Specimen collection method  Answer:  Lab=Lab collect   02/21/24 2348   02/21/24 1056  Hepatitis B surface antibody,quantitative  (New Admission Hemo Labs (Hepatitis B))  Once,   R        02/21/24 1057            Disposition: Status is: Inpatient Remains inpatient appropriate because: Awaiting further clearance from nephrology.  heparin injection 5,000 Units Start: 02/20/24 2200   Family Communication: None at bedside Level of care: Telemetry Medical   Vitals:   02/22/24 1244 02/22/24 1249 02/22/24 1335 02/22/24 1541  BP: (!) 147/100 (!) 147/100 (!) 154/92 (!) 164/93  Pulse: (!) 54   (!) 58  Resp: 19  18 19   Temp:   98 F (36.7 C) 98.2 F (36.8 C)  TempSrc:   Oral Oral  SpO2: 97%  99% 100%  Weight:      Height:  Author: Lynden Oxford, MD 02/22/2024 6:20 PM  Please look on [email or web address] to find out who is on call.

## 2024-02-22 NOTE — Progress Notes (Signed)
 Odd the floor for hemodialysis

## 2024-02-22 NOTE — Op Note (Signed)
    Patient name: Cathy [name] MRN: [id number] DOB: Apr 30, 1981 Sex: female  02/22/2024 Pre-operative Diagnosis: End-stage renal disease, malfunction left upper arm AV fistula with infiltration Post-operative diagnosis:  Same Surgeon:  Luanna Salk. Randie Heinz, MD Procedure Performed: 1.  Ultrasound-guided cannulation left basilic vein AV fistula 2.  Left upper extremity fistulogram with catheter selection of left subclavian vein 3.  Placement of left IJ 23 cm tunneled dialysis catheter with ultrasound and fluoroscopic guidance 4.  Moderate sedation with fentanyl and Versed for 41 minutes  Indications: 43 year old female with history of end-stage renal disease.  She was previously on dialysis via left upper arm AV fistula.  She now has had an infiltration event with swelling of the left upper extremity and has severe pain in the left arm and is indicated for tunneled dialysis catheter placement and fistulogram.  Findings: The left upper extremity fistula was patent with central patency and no evidence of filling of large collateral veins.  Swelling left upper extremity likely due to infiltration event and will need rest of the arm prior to use of the fistula again.  Left IJ tunneled dialysis catheter was placed the SVC atrial junction and did flush and withdraw blood easily at completion.   Procedure:  The patient was identified in the holding area and taken to room 8.  The patient was then placed supine on the table and prepped and draped in the usual sterile fashion.  A time out was called.  Ultrasound was used to evaluate the left arm AV fistula and the area was anesthetized 1% lidocaine and this was cannulated with a micropuncture needle for a wire and sheath.  An ultrasound image was saved the permanent record.  Concomitantly we administered fentanyl and Versed as moderate sedation her vital signs were monitored throughout the case.  We then performed left upper extremity fistulogram and then placed  a Bentson wire followed by a 6 French sheath and a Berenstein catheter was placed at the subclavian vein to complete central fistulogram.  We then performed retrograde fistulogram with pressure held in the fistula which demonstrated widely patent anastomosis and patent arterial trifurcation.  With this we removed the wire and the sheath and the cannulation site was suture-ligated with 4-0 Monocryl suture.  Attention was then turned to the left neck where there was a very large IJ.  This area was anesthetized after sterilely prepping and draping and and we cannulated with micropuncture needle 4 by wire and a sheath.  I then placed a J-wire centrally under fluoroscopic guidance.  A counterincision was created and a 23 cm catheter was tunneled from a counterincision.  We then serially dilated the wire tract and placed introducer sheath of the fluoroscopic guidance and we then placed the catheter to the SVC atrial junction.  This did flush and withdraw blood easily.  We then flocked this with 1.9 cc of heparin either port.  The neck incision was closed with a figure-of-eight Monocryl in the catheter was affixed to the skin with nylon suture.  Sterile dressing was applied.  She tolerated the procedure without immediate complication.  Contrast: 25cc  Izayiah Tibbitts C. Randie Heinz, MD [facility] [place] Office: [phone] Pager: [phone]

## 2024-02-22 NOTE — Progress Notes (Addendum)
  Progress Note    02/22/2024 8:06 AM * No surgery found *  Subjective: Painful left arm   Vitals:   02/22/24 0404 02/22/24 0733  BP: (!) 163/99 (!) 161/101  Pulse: (!) 56 83  Resp: 18 16  Temp: 98 F (36.7 C) 98.7 F (37.1 C)  SpO2: 100% 97%   Physical Exam: Lungs:  non labored Extremities: Palpable left radial pulse; tender to palpation left upper arm and forearm; palpable thrill through fistula Neurologic: A&O  CBC    Component Value Date/Time   WBC 3.9 (L) 02/21/2024 0527   RBC 3.44 (L) 02/21/2024 0527   HGB 10.5 (L) 02/21/2024 0527   HGB 11.0 (L) 08/27/2023 1004   HCT 33.3 (L) 02/21/2024 0527   HCT 35.1 08/27/2023 1004   PLT 80 (L) 02/21/2024 0527   PLT 217 08/27/2023 1004   MCV 96.8 02/21/2024 0527   MCV 97 08/27/2023 1004   MCH 30.5 02/21/2024 0527   MCHC 31.5 02/21/2024 0527   RDW 18.4 (H) 02/21/2024 0527   RDW 15.2 08/27/2023 1004   LYMPHSABS 0.5 (L) 02/20/2024 1055   LYMPHSABS 1.1 08/27/2023 1004   MONOABS 0.3 02/20/2024 1055   EOSABS 0.1 02/20/2024 1055   EOSABS 0.2 08/27/2023 1004   BASOSABS 0.0 02/20/2024 1055   BASOSABS 0.0 08/27/2023 1004    BMET    Component Value Date/Time   NA 139 02/21/2024 0527   NA 136 12/10/2018 1142   K 5.1 02/21/2024 0527   CL 97 (L) 02/21/2024 0527   CO2 24 02/21/2024 0527   GLUCOSE 88 02/21/2024 0527   BUN 84 (H) 02/21/2024 0527   BUN 9 12/10/2018 1142   CREATININE 15.71 (H) 02/21/2024 0527   CALCIUM 9.1 02/21/2024 0527   GFRNONAA 3 (L) 02/21/2024 0527   GFRAA >60 08/07/2020 1209    INR    Component Value Date/Time   INR 1.2 02/01/2024 1244    No intake or output data in the 24 hours ending 02/22/24 1610   Assessment/Plan:  43 y.o. female with left upper extremity swelling  Painful and swollen left upper arm.  Plan is for fistulogram with possible intervention of likely venous outflow obstruction.  She will also receive a TDC.  Continue NPO.  Case was discussed in detail with the patient and she  is agreeable to proceed.    Emilie Rutter, PA-C [facility] [phone] 02/22/2024 8:06 AM  I have independently interviewed and examined patient and agree with PA assessment and plan above.  Plan fistulogram and TDC today in the Cath Lab.  Denyse Fillion C. Randie Heinz, MD [facility] [place] Office: [phone] Pager: [phone]

## 2024-02-22 NOTE — Progress Notes (Addendum)
 Contacted by renal NP regarding pt possibly going to out-pt HD at clinic tomorrow after d/c. Contacted [facility]. Pt's chair time is 10:15 am on TTS and clinic can treat pt tomorrow if pt arrives by 1:00 pm. Spoke to pt via phone. Pt is not agreeable to resuming care at [facility] at d/c and requesting referral be made to [facility] for [place] or [place] [facility]. Contacted renal NP with this information and will await direction from staff. Will assist as needed.   Olivia Canter Renal Navigator [phone]  Addendum at 3:30 pm: Case discussed with renal NP. Pt will need to  resume at out-pt clinic and pt's clinic will assist with transfer to another clinic. Spoke to pt via phone to make aware of this info. Pt agreeable to resuming at [facility] on Thursday and clinic assisting with transfer to new clinic. Pt is not wanting HD treatment tomorrow if pt receives treatment today. This info was provided to renal NP and [facility] staff.

## 2024-02-22 NOTE — Hospital Course (Signed)
 PMH of ESRD on HD, combined CHF, HTN, HLD, NASH, bipolar disorder, GERD, PE present to the hospital with numbness of left upper extremity swelling and pain. Currently being treated for cellulitis and venous outflow obstruction. Initially admitted to [facility], brought to [facility] for vascular surgery evaluation as well as HD. Nephrology and vascular surgery following.  Assessment and Plan: Left upper extremity cellulitis. Currently on IV antibiotic. So far no bacteremia. Would like to switch to p.o. antibiotics tomorrow.  Fistula malfunction. Vascular surgery consulted. Likely after infiltration. Fistulogram shows evidence of patent fistula. TDC placed. Continue pain control.   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 Missed HD secondary to malfunctioning fistula. HD tomorrow after [facility] placement.   ESRD on HD. TTS schedule. Nephrology consulted. HD today and tomorrow.   HTN. Blood pressure stable. Will continue the same medication regimen for now.   Anemia of CKD ESRD. Monitor H&H for now.   History of Nash cirrhosis. No evidence of encephalopathy.  Monitor for now.   COPD. Continue inhalers.   Anxiety depression and bipolar disorder. Currently stable.  Will continue home medication.   Recent diagnosis of bacterial vaginitis as well as trichomonas infection. On Flagyl twice daily for 7 days.

## 2024-02-22 NOTE — Progress Notes (Signed)
 [facility] Progress Note   Subjective:    Seen and examined patient at bedside. L arm remains swollen and tender to touch. VVS following. Plan for F'gram with possible intervention and new TDC placement today. She will then receive HD today after access has been established.  Objective Vitals:   02/21/24 2046 02/22/24 0050 02/22/24 0404 02/22/24 0733  BP: (!) 105/90  (!) 163/99 (!) 161/101  Pulse: (!) 58  (!) 56 83  Resp: 18  18 16   Temp: 98 F (36.7 C)  98 F (36.7 C) 98.7 F (37.1 C)  TempSrc:    Oral  SpO2: 97%  100% 97%  Weight:      Height:  5\' 7"  (1.702 m)     Physical Exam General: Alert, awake, on RA, NAD Heart: S1 and S2; No murmurs, gallops, or rubs Lungs: Clear throughout; No wheezing, rales, or rhonchi Abdomen: Soft and non-tender Extremities: Trace RLE; No edema LLE Dialysis Access: L AVF (+) B/T but L arm edema and tenderness with palpation   Filed Weights   02/20/24 1051  Weight: 66.2 kg   No intake or output data in the 24 hours ending 02/22/24 0943  Additional Objective Labs: Basic Metabolic Panel: Recent Labs  Lab 02/20/24 1055 02/21/24 0527  NA 140 139  K 5.4* 5.1  CL 95* 97*  CO2 25 24  GLUCOSE 102* 88  BUN 76* 84*  CREATININE 14.87* 15.71*  CALCIUM 9.2 9.1   Liver Function Tests: Recent Labs  Lab 02/20/24 1055  AST 15  ALT 12  ALKPHOS 148*  BILITOT 0.9  PROT 6.4*  ALBUMIN 3.0*   No results for input(s): "LIPASE", "AMYLASE" in the last 168 hours. CBC: Recent Labs  Lab 02/20/24 1055 02/21/24 0527  WBC 4.0 3.9*  NEUTROABS 3.0  --   HGB 10.0* 10.5*  HCT 32.4* 33.3*  MCV 97.3 96.8  PLT 87* 80*   Blood Culture    Component Value Date/Time   SDES  02/20/2024 1217    BLOOD RIGHT ARM BOTTLES DRAWN AEROBIC AND ANAEROBIC   SPECREQUEST Blood Culture adequate volume 02/20/2024 1217   CULT  02/20/2024 1217    NO GROWTH 2 DAYS Performed at [facility], [address], [place], [place] [address]    REPTSTATUS  PENDING 02/20/2024 1217    Cardiac Enzymes: No results for input(s): "CKTOTAL", "CKMB", "CKMBINDEX", "TROPONINI" in the last 168 hours. CBG: No results for input(s): "GLUCAP" in the last 168 hours. Iron Studies: No results for input(s): "IRON", "TIBC", "TRANSFERRIN", "FERRITIN" in the last 72 hours. Lab Results  Component Value Date   INR 1.2 02/01/2024   INR 1.8 (H) 03/01/2022   INR 2.1 (H) 01/29/2022   Studies/Results: DG Chest 2 View Result Date: 02/20/2024 CLINICAL DATA:  Shortness of breath with left arm swelling and pain EXAM: CHEST - 2 VIEW COMPARISON:  Chest radiograph dated 02/01/2024 FINDINGS: Patient is rotated slightly to the right. Normal lung volumes. Increased diffuse bilateral interstitial and perihilar pulmonary vasculature. Blunting of the bilateral costophrenic angles. No pneumothorax. Similar markedly enlarged cardiomediastinal silhouette. No acute osseous abnormality. IMPRESSION: Cardiomegaly with pulmonary edema and small bilateral pleural effusions. Electronically Signed   By: Agustin Cree M.D.   On: 02/20/2024 11:57    Medications:  cefTRIAXone (ROCEPHIN)  IV 2 g (02/22/24 0838)    amLODipine  10 mg Oral Daily   busPIRone  30 mg Oral BID   carvedilol  25 mg Oral BID WC   Chlorhexidine Gluconate Cloth  6 each Topical Q0600   cloNIDine  0.3 mg Oral TID   heparin  5,000 Units Subcutaneous Q8H   hydrALAZINE  100 mg Oral TID   hydrOXYzine  10 mg Oral TID   losartan  50 mg Oral QHS   metroNIDAZOLE  500 mg Oral Q12H   pantoprazole  40 mg Oral QAC breakfast   sevelamer carbonate  800 mg Oral TID WC   topiramate  50 mg Oral Daily    Dialysis Orders: TTS [facility]  From aug 2024 --> 4h  400/800   2k/2Ca  No heparin ordered  Renal-related home meds: Norvasc 10 mg Coreg 25 mg twice daily Clonidine 0.3 mg 3 times daily Hydralazine 100 3 times daily Losartan 50 nightly Renvela 800 mg AC 3 times daily Propranolol 10 mg twice daily  Assessment/Plan: Swelling/  cellulitis LUA: the AVF infiltrated Tuesday last week, and the whole arm remains to be swollen and painful to touch. Redness improved per pt. Pt is not amenable to trying AVF. VVS following with concern for possible venous outflow obstruction. Plan for F'gram with possible intervention and new TDC placement today. Hyperkalemia - mild 5.5, responding to lokelma, last K+ 5.1.  ESRD: on HD TTS, last HD 3/18. Pt w/o urgent need for HD. Plan for today after access established.  HTN: bp's good, cont home meds (is on 4-5) Volume: no vol excess on exam, on RA, no LE edema. Follow. Get OP records.   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  Secondary hyperparathyroidism: CCa in range, add on phos and cont binders ac.  Cirrhosis/ ascites - per pmd  Salome Holmes, NP [facility] 02/22/2024,9:43 AM  LOS: 2 days

## 2024-02-22 NOTE — Progress Notes (Signed)
 Patient arrived back to unit in bed, stable condition. Procedure site assessed.    Melony Overly, RN

## 2024-02-22 NOTE — Plan of Care (Signed)

## 2024-02-22 NOTE — Telephone Encounter (Signed)
 Attempted phone call for surgical scheduling.  No VM - unable to leave message.

## 2024-02-23 ENCOUNTER — Encounter (HOSPITAL_COMMUNITY): Payer: Self-pay | Admitting: Vascular Surgery

## 2024-02-23 DIAGNOSIS — L03114 Cellulitis of left upper limb: Secondary | ICD-10-CM | POA: Diagnosis not present

## 2024-02-23 LAB — RENAL FUNCTION PANEL
Albumin: 2.6 g/dL — ABNORMAL LOW (ref 3.5–5.0)
Anion gap: 17 — ABNORMAL HIGH (ref 5–15)
BUN: 57 mg/dL — ABNORMAL HIGH (ref 6–20)
CO2: 24 mmol/L (ref 22–32)
Calcium: 8.7 mg/dL — ABNORMAL LOW (ref 8.9–10.3)
Chloride: 97 mmol/L — ABNORMAL LOW (ref 98–111)
Creatinine, Ser: 11.58 mg/dL — ABNORMAL HIGH (ref 0.44–1.00)
GFR, Estimated: 4 mL/min — ABNORMAL LOW (ref >60)
Glucose, Bld: 118 mg/dL — ABNORMAL HIGH (ref 70–99)
Phosphorus: 8 mg/dL — ABNORMAL HIGH (ref 2.5–4.6)
Potassium: 4.1 mmol/L (ref 3.5–5.1)
Sodium: 138 mmol/L (ref 135–145)

## 2024-02-23 LAB — HEPATITIS B SURFACE ANTIBODY, QUANTITATIVE: Hep B S AB Quant (Post): 49.5 m[IU]/mL

## 2024-02-23 LAB — CBC
HCT: 30 % — ABNORMAL LOW (ref 36.0–46.0)
Hemoglobin: 9.7 g/dL — ABNORMAL LOW (ref 12.0–15.0)
MCH: 30.1 pg (ref 26.0–34.0)
MCHC: 32.3 g/dL (ref 30.0–36.0)
MCV: 93.2 fL (ref 80.0–100.0)
Platelets: 82 10*3/uL — ABNORMAL LOW (ref 150–400)
RBC: 3.22 MIL/uL — ABNORMAL LOW (ref 3.87–5.11)
RDW: 17.9 % — ABNORMAL HIGH (ref 11.5–15.5)
WBC: 3.3 10*3/uL — ABNORMAL LOW (ref 4.0–10.5)
nRBC: 0 % (ref 0.0–0.2)

## 2024-02-23 LAB — MAGNESIUM: Magnesium: 2.1 mg/dL (ref 1.7–2.4)

## 2024-02-23 MED ORDER — CHLORHEXIDINE GLUCONATE CLOTH 2 % EX PADS
6.0000 | MEDICATED_PAD | Freq: Every day | CUTANEOUS | Status: DC
  Administered 2024-02-23 – 2024-02-24 (×2): 6 via TOPICAL

## 2024-02-23 MED ORDER — DOXYCYCLINE HYCLATE 100 MG PO TABS: 100.0000 mg | ORAL_TABLET | Freq: Two times a day (BID) | ORAL | 0 refills | Status: AC

## 2024-02-23 MED ORDER — METHOCARBAMOL 500 MG PO TABS: 500.0000 mg | ORAL_TABLET | Freq: Three times a day (TID) | ORAL | 0 refills | Status: DC | PRN

## 2024-02-23 MED ORDER — METRONIDAZOLE 500 MG PO TABS: 500.0000 mg | ORAL_TABLET | Freq: Two times a day (BID) | ORAL | 0 refills | Status: AC

## 2024-02-23 MED ORDER — CAMPHOR-MENTHOL 0.5-0.5 % EX LOTN: TOPICAL_LOTION | CUTANEOUS | 0 refills | Status: DC | PRN

## 2024-02-23 MED ORDER — ONDANSETRON HCL 4 MG PO TABS: 4.0000 mg | ORAL_TABLET | Freq: Three times a day (TID) | ORAL | 0 refills | Status: DC | PRN

## 2024-02-23 MED ORDER — CEPHALEXIN 250 MG PO CAPS
250.0000 mg | ORAL_CAPSULE | Freq: Two times a day (BID) | ORAL | Status: DC
  Administered 2024-02-23 – 2024-02-24 (×2): 250 mg via ORAL
  Filled 2024-02-23 (×3): qty 1

## 2024-02-23 MED ORDER — OXYCODONE-ACETAMINOPHEN 5-325 MG PO TABS: 1.0000 | ORAL_TABLET | Freq: Three times a day (TID) | ORAL | 0 refills | Status: AC | PRN

## 2024-02-23 MED ORDER — PANTOPRAZOLE SODIUM 40 MG PO TBEC: 40.0000 mg | DELAYED_RELEASE_TABLET | Freq: Every day | ORAL | 0 refills | Status: DC

## 2024-02-23 MED FILL — Heparin Sodium (Porcine) Inj 1000 Unit/ML: INTRAMUSCULAR | Qty: 10 | Status: AC

## 2024-02-23 NOTE — Progress Notes (Signed)
 Contacted [facility] this morning to provide an update on pt. Will assist as needed.   Olivia Canter Renal Navigator [phone]

## 2024-02-23 NOTE — Progress Notes (Addendum)
  [facility] Progress Note   Subjective:    Seen and examined patient at bedside. S/p F'gram and left [facility] placement yesterday by Dr. Randie Heinz. Tolerated HD overnight with net UF 3L. She reports feeling mildly short-winded this morning. She remains on RA and not in acute respiratory distress. Plan to dialyze her early tomorrow morning (3/26) then she can go home afterwards. She then can resume HD on Thursday in outpatient. She also requested wanting to be transferred to another outpatient HD center. I discussed with her this can be handled in outpatient given high census in the hospital. She verbalized understanding.  Objective Vitals:   02/23/24 0054 02/23/24 0144 02/23/24 0605 02/23/24 0800  BP: (!) 178/101 (!) 155/96 (!) 154/86 (!) 149/89  Pulse: 62 63 (!) 57 (!) 57  Resp: (!) 22 20 20    Temp: 98.4 F (36.9 C) 97.9 F (36.6 C) 98.4 F (36.9 C) 98.7 F (37.1 C)  TempSrc:    Oral  SpO2: 100% 100% 100% 100%  Weight:      Height:       Physical Exam General: Alert, awake, on RA, NAD Heart: S1 and S2; No murmurs, gallops, or rubs Lungs: Posterior rales lower-mid lobes; No wheezing or rhonchi Abdomen: Soft and non-tender Extremities: Trace RLE; No edema LLE Dialysis Access: L AVF (+) B/T but L arm edema and tenderness with palpation   Filed Weights   02/20/24 1051  Weight: 66.2 kg    Intake/Output Summary (Last 24 hours) at 02/23/2024 1009 Last data filed at 02/23/2024 0054 Gross per 24 hour  Intake --  Output 3000 ml  Net -3000 ml    Additional Objective Labs: Basic Metabolic Panel: Recent Labs  Lab 02/21/24 0527 02/22/24 0803 02/23/24 0636  NA 139 139 138  K 5.1 5.5* 4.1  CL 97* 97* 97*  CO2 24 22 24   GLUCOSE 88 77 118*  BUN 84* 98* 57*  CREATININE 15.71* 17.51* 11.58*  CALCIUM 9.1 9.1 8.7*  PHOS  --  >30.0* 8.0*   Liver Function Tests: Recent Labs  Lab 02/20/24 1055 02/22/24 0803 02/23/24 0636  AST 15  --   --   ALT 12  --   --   ALKPHOS  148*  --   --   BILITOT 0.9  --   --   PROT 6.4*  --   --   ALBUMIN 3.0* 2.8* 2.6*   No results for input(s): "LIPASE", "AMYLASE" in the last 168 hours. CBC: Recent Labs  Lab 02/20/24 1055 02/21/24 0527 02/22/24 0803 02/23/24 0636  WBC 4.0 3.9* 4.0 3.3*  NEUTROABS 3.0  --   --   --   HGB 10.0* 10.5* 9.8* 9.7*  HCT 32.4* 33.3* 31.0* 30.0*  MCV 97.3 96.8 94.5 93.2  PLT 87* 80* 74* 82*   Blood Culture    Component Value Date/Time   SDES  02/20/2024 1217    BLOOD RIGHT ARM BOTTLES DRAWN AEROBIC AND ANAEROBIC   SPECREQUEST Blood Culture adequate volume 02/20/2024 1217   CULT  02/20/2024 1217    NO GROWTH 3 DAYS Performed at [facility], [address], [place], [place] [address]    REPTSTATUS PENDING 02/20/2024 1217    Cardiac Enzymes: No results for input(s): "CKTOTAL", "CKMB", "CKMBINDEX", "TROPONINI" in the last 168 hours. CBG: No results for input(s): "GLUCAP" in the last 168 hours. Iron Studies: No results for input(s): "IRON", "TIBC", "TRANSFERRIN", "FERRITIN" in the last 72 hours. Lab Results  Component Value Date  INR 1.2 02/01/2024   INR 1.8 (H) 03/01/2022   INR 2.1 (H) 01/29/2022   Studies/Results: PERIPHERAL VASCULAR CATHETERIZATION Result Date: 02/23/2024 Images from the original result were not included.   Patient name: Cathy [name]          MRN: [id number]        DOB: November 11, 1981        Sex: female  02/22/2024 Pre-operative Diagnosis: End-stage renal disease, malfunction left upper arm AV fistula with infiltration Post-operative diagnosis:  Same Surgeon:  Luanna Salk. Randie Heinz, MD Procedure Performed: 1.  Ultrasound-guided cannulation left basilic vein AV fistula 2.  Left upper extremity fistulogram with catheter selection of left subclavian vein 3.  Placement of left IJ 23 cm tunneled dialysis catheter with ultrasound and fluoroscopic guidance 4.  Moderate sedation with fentanyl and Versed for 41 minutes  Indications: 43 year old female with history of end-stage  renal disease.  She was previously on dialysis via left upper arm AV fistula.  She now has had an infiltration event with swelling of the left upper extremity and has severe pain in the left arm and is indicated for tunneled dialysis catheter placement and fistulogram.  Findings: The left upper extremity fistula was patent with central patency and no evidence of filling of large collateral veins.  Swelling left upper extremity likely due to infiltration event and will need rest of the arm prior to use of the fistula again.  Left IJ tunneled dialysis catheter was placed the SVC atrial junction and did flush and withdraw blood easily at completion.             Procedure:  The patient was identified in the holding area and taken to room 8.  The patient was then placed supine on the table and prepped and draped in the usual sterile fashion.  A time out was called.  Ultrasound was used to evaluate the left arm AV fistula and the area was anesthetized 1% lidocaine and this was cannulated with a micropuncture needle for a wire and sheath.  An ultrasound image was saved the permanent record.  Concomitantly we administered fentanyl and Versed as moderate sedation her vital signs were monitored throughout the case.  We then performed left upper extremity fistulogram and then placed a Bentson wire followed by a 6 French sheath and a Berenstein catheter was placed at the subclavian vein to complete central fistulogram.  We then performed retrograde fistulogram with pressure held in the fistula which demonstrated widely patent anastomosis and patent arterial trifurcation.  With this we removed the wire and the sheath and the cannulation site was suture-ligated with 4-0 Monocryl suture.  Attention was then turned to the left neck where there was a very large IJ.  This area was anesthetized after sterilely prepping and draping and and we cannulated with micropuncture needle 4 by wire and a sheath.  I then placed a J-wire centrally  under fluoroscopic guidance.  A counterincision was created and a 23 cm catheter was tunneled from a counterincision.  We then serially dilated the wire tract and placed introducer sheath of the fluoroscopic guidance and we then placed the catheter to the SVC atrial junction.  This did flush and withdraw blood easily.  We then flocked this with 1.9 cc of heparin either port.  The neck incision was closed with a figure-of-eight Monocryl in the catheter was affixed to the skin with nylon suture.  Sterile dressing was applied.  She tolerated the procedure without immediate complication.  Contrast: 25cc  Brandon C. Randie Heinz, MD [facility] Office: [phone] Pager: [phone]     Medications:  cefTRIAXone (ROCEPHIN)  IV 2 g (02/23/24 0852)    amLODipine  10 mg Oral Daily   busPIRone  30 mg Oral BID   carvedilol  25 mg Oral BID WC   Chlorhexidine Gluconate Cloth  6 each Topical Q0600   cloNIDine  0.3 mg Oral TID   heparin  5,000 Units Subcutaneous Q8H   hydrALAZINE  100 mg Oral TID   losartan  50 mg Oral QHS   metroNIDAZOLE  500 mg Oral Q12H   pantoprazole  40 mg Oral QAC breakfast   sevelamer carbonate  800 mg Oral TID WC   topiramate  50 mg Oral Daily    Dialysis Orders: TTS [facility]  From aug 2024 --> 4h  400/800   2k/2Ca  No heparin ordered, EDW 57.7kg  Renal-related home meds: Norvasc 10 mg Coreg 25 mg twice daily Clonidine 0.3 mg 3 times daily Hydralazine 100 3 times daily Losartan 50 nightly Renvela 800 mg AC 3 times daily Propranolol 10 mg twice daily  Assessment/Plan: Swelling/ cellulitis LUA: the AVF infiltrated Tuesday last week, and the whole arm remains to be swollen and painful to touch. Redness improved per pt. Pt is not amenable to trying AVF. VVS following. S/p F'gram and left TDC placement 3/24 by Dr. Randie Heinz. Per VVS, F'gram was negative for central stenosis. AVF is widely patent and pain/swelling 2nd to infiltration. Hyperkalemia - resolved,  current K+ stable.  ESRD: on HD TTS, last HD 3/18. Pt w/o urgent need for HD. Next HD 3/26.  HTN: bp's good, cont home meds (is on 4-5) Volume: Patient reports feeling short-winded this AM. Appears Euvolemic and remains on RA. 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  Secondary hyperparathyroidism: CCa in range, add on phos and cont binders ac.  Cirrhosis/ ascites - per pmd Dispo - HD currently off schedule. Plan for next HD tomorrow morning then she can be discharged afterwards. She will then resume HD Thursday in outpatient. She also requested she be transferred to another outpatient HD center. I discussed with her we can sort this out in outpatient given high census here in the hospital. She voiced understanding.  Salome Holmes, NP [facility] 02/23/2024,10:09 AM  LOS: 3 days

## 2024-02-23 NOTE — Progress Notes (Addendum)
  Progress Note    02/23/2024 7:42 AM 1 Day Post-Op  Subjective:  L arm pain slightly improved today   Vitals:   02/23/24 0144 02/23/24 0605  BP: (!) 155/96 (!) 154/86  Pulse: 63 (!) 57  Resp: 20 20  Temp: 97.9 F (36.6 C) 98.4 F (36.9 C)  SpO2: 100% 100%   Physical Exam: Lungs:  non labored Incisions:  L TDC without bleeding Extremities:  palpable L radial; palpable thrill L AVF Neurologic: A&O  CBC    Component Value Date/Time   WBC 4.0 02/22/2024 0803   RBC 3.28 (L) 02/22/2024 0803   HGB 9.8 (L) 02/22/2024 0803   HGB 11.0 (L) 08/27/2023 1004   HCT 31.0 (L) 02/22/2024 0803   HCT 35.1 08/27/2023 1004   PLT 74 (L) 02/22/2024 0803   PLT 217 08/27/2023 1004   MCV 94.5 02/22/2024 0803   MCV 97 08/27/2023 1004   MCH 29.9 02/22/2024 0803   MCHC 31.6 02/22/2024 0803   RDW 18.0 (H) 02/22/2024 0803   RDW 15.2 08/27/2023 1004   LYMPHSABS 0.5 (L) 02/20/2024 1055   LYMPHSABS 1.1 08/27/2023 1004   MONOABS 0.3 02/20/2024 1055   EOSABS 0.1 02/20/2024 1055   EOSABS 0.2 08/27/2023 1004   BASOSABS 0.0 02/20/2024 1055   BASOSABS 0.0 08/27/2023 1004    BMET    Component Value Date/Time   NA 139 02/22/2024 0803   NA 136 12/10/2018 1142   K 5.5 (H) 02/22/2024 0803   CL 97 (L) 02/22/2024 0803   CO2 22 02/22/2024 0803   GLUCOSE 77 02/22/2024 0803   BUN 98 (H) 02/22/2024 0803   BUN 9 12/10/2018 1142   CREATININE 17.51 (H) 02/22/2024 0803   CALCIUM 9.1 02/22/2024 0803   GFRNONAA 2 (L) 02/22/2024 0803   GFRAA >60 08/07/2020 1209    INR    Component Value Date/Time   INR 1.2 02/01/2024 1244     Intake/Output Summary (Last 24 hours) at 02/23/2024 1610 Last data filed at 02/23/2024 0054 Gross per 24 hour  Intake --  Output 3000 ml  Net -3000 ml     Assessment/Plan:  43 y.o. female is s/p L arm fistulogram with TDC placement 1 Day Post-Op   L internal jugular TDC worked well on HD last night; soreness to [facility] site this morning as expected Subjectively L arm  pain improved; fistulogram negative for central stenosis Continue HD via TDC until L arm swelling resolves Ok for d/c from vascular standpoint   Emilie Rutter, PA-C [facility] [phone] 02/23/2024 7:42 AM   I have independently interviewed and examined patient and agree with PA assessment and plan above. Use tdc until left arm edema improves and then tdc can be removed.   Maylani Embree C. Randie Heinz, MD [facility] Office: [phone] Pager: [phone]

## 2024-02-23 NOTE — Care Management Important Message (Signed)
 Important Message  Patient Details  Name: FAYNE MCGUFFEE MRN: [id number] Date of Birth: [birth year]   Important Message Given:  Yes - Medicare IM     Sherilyn Banker 02/23/2024, 11:42 AM

## 2024-02-23 NOTE — Progress Notes (Signed)
 Back on the floor after dialysis.

## 2024-02-23 NOTE — Progress Notes (Signed)
 Completed 3 hours of dialysis. Tolerated fluid removal of 3L. VS stable. No significant events. LIJ TDC saline-locked and de-accessed using aseptic technique. Clamped and replaced caps securely. Patient is alert and oriented, no apparent signs of distress. Handoff given to [employer]

## 2024-02-23 NOTE — Plan of Care (Signed)

## 2024-02-23 NOTE — Plan of Care (Signed)

## 2024-02-23 NOTE — Progress Notes (Signed)
 [facility] Progress Note Patient: Cathy [name] ZOX:[id number] DOB: [birth year] DOA: 02/20/2024  DOS: the patient was seen and examined on 02/23/2024  Brief Hospital Course: PMH of ESRD on HD, combined CHF, HTN, HLD, NASH, bipolar disorder, GERD, PE present to the hospital with numbness of left upper extremity swelling and pain. Currently being treated for cellulitis and venous outflow obstruction. Initially admitted to [facility], brought to [facility] for vascular surgery evaluation as well as HD. Nephrology and vascular surgery following.  Assessment and Plan: Left upper extremity cellulitis. Currently on IV antibiotic. Switch to oral Keflex. Blood culture negative for 3 days.  Fistula malfunction. Vascular surgery consulted. Likely after infiltration. Fistulogram shows evidence of patent fistula. TDC placed. Continue pain control.   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 Missed HD secondary to malfunctioning fistula. Reports some shortness of breath on 3/24 therefore nephrology recommending repeat EGD in 3/26.   ESRD on HD. TTS schedule. Nephrology consulted. TDC placed. Patient will continue outpatient HD on TTS schedule.   HTN. Blood pressure stable. Will continue the same medication regimen for now.   Anemia of CKD ESRD. Monitor H&H for now.   History of Nash cirrhosis. No evidence of encephalopathy.  Monitor for now.   COPD. Continue inhalers.   Anxiety depression and bipolar disorder. Currently stable.  Will continue home medication.   Recent diagnosis of bacterial vaginitis as well as trichomonas infection. On Flagyl twice daily for 7 days.   Subjective: No nausea no vomiting no fever no chills but reports some congestion and shortness of breath.  Left upper extremity swelling unchanged.   Physical Exam: General: in Mild distress, No Rash Cardiovascular: S1 and S2 Present, No Murmur Respiratory: Good respiratory effort, Bilateral Air entry present.  faint Crackles, No wheezes Abdomen: Bowel Sound present, No tenderness Extremities: Left upper extremity edema Neuro: Alert and oriented x3, no new focal deficit  Data Reviewed: I have Reviewed nursing notes, Vitals, and Lab results. Since last encounter, pertinent lab results CBC and BMP   . I have ordered test including CBC and BMP,  .  Discussed with nephrology.  Disposition: Status is: Inpatient Remains inpatient appropriate because: Awaiting HD tomorrow.  heparin injection 5,000 Units Start: 02/20/24 2200   Family Communication: No one at bedside Level of care: Telemetry Medical   Vitals:   02/23/24 0144 02/23/24 0605 02/23/24 0800 02/23/24 1716  BP: (!) 155/96 (!) 154/86 (!) 149/89 (!) 126/92  Pulse: 63 (!) 57 (!) 57 (!) 53  Resp: 20 20  18   Temp: 97.9 F (36.6 C) 98.4 F (36.9 C) 98.7 F (37.1 C) 98.5 F (36.9 C)  TempSrc:   Oral   SpO2: 100% 100% 100% 99%  Weight:      Height:         Author: Lynden Oxford, MD 02/23/2024 6:48 PM  Please look on [email or web address] to find out who is on call.

## 2024-02-24 DIAGNOSIS — L03114 Cellulitis of left upper limb: Secondary | ICD-10-CM | POA: Diagnosis not present

## 2024-02-24 LAB — CBC
HCT: 31.5 % — ABNORMAL LOW (ref 36.0–46.0)
Hemoglobin: 10.1 g/dL — ABNORMAL LOW (ref 12.0–15.0)
MCH: 30.1 pg (ref 26.0–34.0)
MCHC: 32.1 g/dL (ref 30.0–36.0)
MCV: 93.8 fL (ref 80.0–100.0)
Platelets: 84 10*3/uL — ABNORMAL LOW (ref 150–400)
RBC: 3.36 MIL/uL — ABNORMAL LOW (ref 3.87–5.11)
RDW: 17.8 % — ABNORMAL HIGH (ref 11.5–15.5)
WBC: 3.4 10*3/uL — ABNORMAL LOW (ref 4.0–10.5)
nRBC: 0 % (ref 0.0–0.2)

## 2024-02-24 LAB — RENAL FUNCTION PANEL
Albumin: 2.7 g/dL — ABNORMAL LOW (ref 3.5–5.0)
Anion gap: 17 — ABNORMAL HIGH (ref 5–15)
BUN: 65 mg/dL — ABNORMAL HIGH (ref 6–20)
CO2: 24 mmol/L (ref 22–32)
Calcium: 8.8 mg/dL — ABNORMAL LOW (ref 8.9–10.3)
Chloride: 96 mmol/L — ABNORMAL LOW (ref 98–111)
Creatinine, Ser: 13.41 mg/dL — ABNORMAL HIGH (ref 0.44–1.00)
GFR, Estimated: 3 mL/min — ABNORMAL LOW (ref >60)
Glucose, Bld: 95 mg/dL (ref 70–99)
Phosphorus: 9.1 mg/dL — ABNORMAL HIGH (ref 2.5–4.6)
Potassium: 4.5 mmol/L (ref 3.5–5.1)
Sodium: 137 mmol/L (ref 135–145)

## 2024-02-24 LAB — MAGNESIUM: Magnesium: 2.3 mg/dL (ref 1.7–2.4)

## 2024-02-24 MED ORDER — HEPARIN SODIUM (PORCINE) 1000 UNIT/ML DIALYSIS: 1000.0000 [IU] | INTRAMUSCULAR | Status: DC | PRN

## 2024-02-24 MED ORDER — PENTAFLUOROPROP-TETRAFLUOROETH EX AERO: 1.0000 | INHALATION_SPRAY | CUTANEOUS | Status: DC | PRN

## 2024-02-24 MED ORDER — LIDOCAINE-PRILOCAINE 2.5-2.5 % EX CREA: 1.0000 | TOPICAL_CREAM | CUTANEOUS | Status: DC | PRN

## 2024-02-24 MED ORDER — ANTICOAGULANT SODIUM CITRATE 4% (200MG/5ML) IV SOLN: 5.0000 mL | Status: DC | PRN

## 2024-02-24 MED ORDER — OXYCODONE HCL 5 MG PO TABS: 10.0000 mg | ORAL_TABLET | Freq: Four times a day (QID) | ORAL | Status: DC | PRN

## 2024-02-24 MED ORDER — ALTEPLASE 2 MG IJ SOLR: 2.0000 mg | Freq: Once | INTRAMUSCULAR | Status: DC | PRN

## 2024-02-24 MED ORDER — LIDOCAINE HCL (PF) 1 % IJ SOLN: 5.0000 mL | INTRAMUSCULAR | Status: DC | PRN

## 2024-02-24 NOTE — Progress Notes (Signed)
 D/C order noted. Contacted [facility] to be advised of pt's d/c today and that pt should resume care tomorrow.   Olivia Canter Renal Navigator [phone]

## 2024-02-24 NOTE — Discharge Summary (Signed)
 Physician Discharge Summary  Cathy [name] MWN:[id number] DOB: Jan 11, 1981 DOA: 02/20/2024  PCP: Rica Records, FNP  Admit date: 02/20/2024 Discharge date: 02/24/2024  Admitted From: Home Disposition:  Home  Recommendations for Outpatient Follow-up:  Follow up with vascular surgery in 2 weeks.  Home Health:No Equipment/Devices:None  Discharge Condition:Stable CODE STATUS:Full Diet recommendation: Heart Healthy  Brief/Interim Summary:  43 y.o. female past medical history of end-stage renal disease on hemodialysis, chronic diastolic and systolic heart failure with an EF of 35% back in 2024 NASH, bipolar disorder, history of PE not on anticoagulation came into the hospital with left upper extremity swelling and pain, currently being treated for cellulitis and venous outlet obstruction.  Sent for [facility] for vascular surgery evaluation as well as HD.  Nephrology vascular surgery following   Discharge Diagnoses:  Principal Problem:   Cellulitis Active Problems:   ESRD (end stage renal disease) (HCC)   Benign essential HTN   Chronic combined systolic and diastolic congestive heart failure (HCC)   Cirrhosis of liver with ascites (HCC)   MDD (major depressive disorder)   Anemia   Lupus nephritis (HCC)   Anemia due to chronic kidney disease   Acute hyperkalemia   Gastroesophageal reflux disease   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 cor pulmonale (HCC)   Anxiety and depression   Vaginal discharge  Left upper extremity cellulitis: 30 empirically on IV Rocephin,now on oral Keflex, blood cultures have been negative till date. She will finish her treatment as an outpatient.   Fistula malfunction: Vascular surgery was consulted fistulogram showed no evidence of obstruction. TDC placed. Continue narcotics for pain control.   Acute on chronic combined systolic and diastolic heart failure: Echo with an EF of 35%. Missed dialysis due to BMI HD malfunction. Continue  fluid management per renal.   End-stage renal disease: Usually dialyzes Tuesday Thursdays and Saturdays and [facility] placed and appropriately working.   Essential Hypertension: Continue Norvasc, Coreg, clonidine and ARB. Blood pressure is elevated.   Anemia of chronic renal disease: Continue management per renal.   History of NASH cirrhosis: No Encephalopathy.   Anxiety, depression and bipolar: Currently stable continue current home regimen.   Bacterial vaginosis Trichomonas: Currently on a 7-day course of Flagyl.    Discharge Instructions  Discharge Instructions     Diet - low sodium heart healthy   Complete by: As directed    Diet renal with fluid restriction   Complete by: As directed    Increase activity slowly   Complete by: As directed    Increase activity slowly   Complete by: As directed    No wound care   Complete by: As directed       Allergies as of 02/24/2024       Reactions   Dialyvite 800 [nephro-vite] Nausea And Vomiting        Medication List     TAKE these medications    acetaminophen 500 MG tablet Commonly known as: TYLENOL Take 1,000 mg by mouth every 6 (six) hours as needed.   albuterol 108 (90 Base) MCG/ACT inhaler Commonly known as: VENTOLIN HFA Inhale 2 puffs into the lungs every 6 (six) hours as needed for wheezing or shortness of breath.   amLODipine 10 MG tablet Commonly known as: NORVASC Take 1 tablet (10 mg total) by mouth in the morning.   budesonide-formoterol 160-4.5 MCG/ACT inhaler Commonly known as: Symbicort Inhale 2 puffs into the lungs 2 (two) times daily.   busPIRone 30 MG tablet Commonly known  as: BUSPAR TAKE 1 TABLET (30 MG TOTAL) BY MOUTH IN THE MORNING AND AT BEDTIME.   camphor-menthol lotion Commonly known as: SARNA Apply topically as needed for itching.   carvedilol 25 MG tablet Commonly known as: COREG Take 1 tablet (25 mg total) by mouth 2 (two) times daily with a meal.   cloNIDine 0.3 MG  tablet Commonly known as: CATAPRES Take 1 tablet (0.3 mg total) by mouth 3 (three) times daily.   doxycycline 100 MG tablet Commonly known as: VIBRA-TABS Take 1 tablet (100 mg total) by mouth 2 (two) times daily for 5 days.   HECTOROL IV Doxercalciferol (Hectorol)   hydrALAZINE 100 MG tablet Commonly known as: APRESOLINE Take 1 tablet (100 mg total) by mouth 3 (three) times daily.   losartan 50 MG tablet Commonly known as: COZAAR Take 50 mg by mouth at bedtime.   methocarbamol 500 MG tablet Commonly known as: ROBAXIN Take 1 tablet (500 mg total) by mouth every 8 (eight) hours as needed for muscle spasms.   metroNIDAZOLE 500 MG tablet Commonly known as: FLAGYL Take 1 tablet (500 mg total) by mouth 2 (two) times daily for 4 days.   MIRCERA IJ   Omron 3 Series BP Monitor Devi Use as directed 2 times a day   ondansetron 4 MG tablet Commonly known as: ZOFRAN Take 1 tablet (4 mg total) by mouth every 8 (eight) hours as needed for nausea or vomiting.   oxyCODONE-acetaminophen 5-325 MG tablet Commonly known as: PERCOCET/ROXICET Take 1 tablet by mouth every 8 (eight) hours as needed for up to 5 days for severe pain (pain score 7-10) or moderate pain (pain score 4-6). What changed:  when to take this reasons to take this   pantoprazole 40 MG tablet Commonly known as: PROTONIX Take 1 tablet (40 mg total) by mouth daily before breakfast.   propranolol 10 MG tablet Commonly known as: INDERAL TAKE 1 TABLET BY MOUTH EVERY 12 HOURS AS NEEDED.   sevelamer carbonate 800 MG tablet Commonly known as: RENVELA Take 800 mg by mouth 3 (three) times daily.   topiramate 50 MG tablet Commonly known as: TOPAMAX TAKE 1 TABLET BY MOUTH EVERY DAY        Follow-up Information     Del Nigel Berthold, FNP. Schedule an appointment as soon as possible for a visit in 2 week(s).   Specialty: Family Medicine Contact information: [address] [place]  [address] [phone]         [facility] at [facility]. Schedule an appointment as soon as possible for a visit in 2 week(s).   Specialty: Vascular Surgery Contact information: [address] [place] [address] [phone]               Allergies  Allergen Reactions   Dialyvite 800 [Nephro-Vite] Nausea And Vomiting    Consultations: Vascular Surgeon Nephrology   Procedures/Studies: PERIPHERAL VASCULAR CATHETERIZATION Result Date: 02/23/2024 Images from the original result were not included.   Patient name: Cathy [name]          MRN: [id number]        DOB: 1980-12-08        Sex: female  02/22/2024 Pre-operative Diagnosis: End-stage renal disease, malfunction left upper arm AV fistula with infiltration Post-operative diagnosis:  Same Surgeon:  Luanna Salk. Randie Heinz, MD Procedure Performed: 1.  Ultrasound-guided cannulation left basilic vein AV fistula 2.  Left upper extremity fistulogram with catheter selection of left  subclavian vein 3.  Placement of left IJ 23 cm tunneled dialysis catheter with ultrasound and fluoroscopic guidance 4.  Moderate sedation with fentanyl and Versed for 41 minutes  Indications: 43 year old female with history of end-stage renal disease.  She was previously on dialysis via left upper arm AV fistula.  She now has had an infiltration event with swelling of the left upper extremity and has severe pain in the left arm and is indicated for tunneled dialysis catheter placement and fistulogram.  Findings: The left upper extremity fistula was patent with central patency and no evidence of filling of large collateral veins.  Swelling left upper extremity likely due to infiltration event and will need rest of the arm prior to use of the fistula again.  Left IJ tunneled dialysis catheter was placed the SVC atrial junction and did flush and withdraw blood easily at completion.             Procedure:  The patient was identified in  the holding area and taken to room 8.  The patient was then placed supine on the table and prepped and draped in the usual sterile fashion.  A time out was called.  Ultrasound was used to evaluate the left arm AV fistula and the area was anesthetized 1% lidocaine and this was cannulated with a micropuncture needle for a wire and sheath.  An ultrasound image was saved the permanent record.  Concomitantly we administered fentanyl and Versed as moderate sedation her vital signs were monitored throughout the case.  We then performed left upper extremity fistulogram and then placed a Bentson wire followed by a 6 French sheath and a Berenstein catheter was placed at the subclavian vein to complete central fistulogram.  We then performed retrograde fistulogram with pressure held in the fistula which demonstrated widely patent anastomosis and patent arterial trifurcation.  With this we removed the wire and the sheath and the cannulation site was suture-ligated with 4-0 Monocryl suture.  Attention was then turned to the left neck where there was a very large IJ.  This area was anesthetized after sterilely prepping and draping and and we cannulated with micropuncture needle 4 by wire and a sheath.  I then placed a J-wire centrally under fluoroscopic guidance.  A counterincision was created and a 23 cm catheter was tunneled from a counterincision.  We then serially dilated the wire tract and placed introducer sheath of the fluoroscopic guidance and we then placed the catheter to the SVC atrial junction.  This did flush and withdraw blood easily.  We then flocked this with 1.9 cc of heparin either port.  The neck incision was closed with a figure-of-eight Monocryl in the catheter was affixed to the skin with nylon suture.  Sterile dressing was applied.  She tolerated the procedure without immediate complication.  Contrast: 25cc  Brandon C. Randie Heinz, MD [facility] [place] Office: [phone] Pager:  [phone]    DG Chest 2 View Result Date: 02/20/2024 CLINICAL DATA:  Shortness of breath with left arm swelling and pain EXAM: CHEST - 2 VIEW COMPARISON:  Chest radiograph dated 02/01/2024 FINDINGS: Patient is rotated slightly to the right. Normal lung volumes. Increased diffuse bilateral interstitial and perihilar pulmonary vasculature. Blunting of the bilateral costophrenic angles. No pneumothorax. Similar markedly enlarged cardiomediastinal silhouette. No acute osseous abnormality. IMPRESSION: Cardiomegaly with pulmonary edema and small bilateral pleural effusions. Electronically Signed   By: Agustin Cree M.D.   On: 02/20/2024 11:57   US Venous Img Upper Uni  Left Result Date: 02/15/2024 CLINICAL DATA:  Right arm pain after dialysis 4 days ago. EXAM: Left UPPER EXTREMITY VENOUS DOPPLER ULTRASOUND TECHNIQUE: Gray-scale sonography with graded compression, as well as color Doppler and duplex ultrasound were performed to evaluate the upper extremity deep venous system from the level of the subclavian vein and including the jugular, axillary, basilic, radial, ulnar and upper cephalic vein. Spectral Doppler was utilized to evaluate flow at rest and with distal augmentation maneuvers. COMPARISON:  None Available. FINDINGS: Contralateral Subclavian Vein: Respiratory phasicity is normal and symmetric with the symptomatic side. No evidence of thrombus. Normal compressibility. Of note there is some slow flow in the right internal jugular vein. Internal Jugular Vein: No evidence of thrombus. Normal compressibility, respiratory phasicity and response to augmentation. Subclavian Vein: No evidence of thrombus. Normal compressibility, respiratory phasicity and response to augmentation. Axillary Vein: No evidence of thrombus. Normal compressibility, respiratory phasicity and response to augmentation. Cephalic Vein: No evidence of thrombus. Normal compressibility, respiratory phasicity and response to augmentation. Basilic  Vein: No evidence of thrombus. Normal compressibility, respiratory phasicity and response to augmentation. Brachial Veins: No evidence of thrombus. Normal compressibility, respiratory phasicity and response to augmentation. Radial Veins: No evidence of thrombus. Normal compressibility, respiratory phasicity and response to augmentation. Ulnar Veins: No evidence of thrombus. Normal compressibility, respiratory phasicity and response to augmentation. Venous Reflux:  None visualized. Other Findings: Dialysis fistula the is noted and essentially patent at the arterial anastomosis. The dialysis fistula was not interrogated in detail today. IMPRESSION: No evidence of left upper extremity DVT. Electronically Signed   By: Karen Kays M.D.   On: 02/15/2024 10:00   CT ABDOMEN PELVIS WO CONTRAST Result Date: 02/01/2024 CLINICAL DATA:  Abdominal pain and swelling causing difficulty breathing. EXAM: CT ABDOMEN AND PELVIS WITHOUT CONTRAST TECHNIQUE: Multidetector CT imaging of the abdomen and pelvis was performed following the standard protocol without IV contrast. RADIATION DOSE REDUCTION: This exam was performed according to the departmental dose-optimization program which includes automated exposure control, adjustment of the mA and/or kV according to patient size and/or use of iterative reconstruction technique. COMPARISON:  October 05, 2023 FINDINGS: Lower chest: Stable moderate severity cardiomegaly is seen with a very small pericardial effusion. Stable mild to moderate severity scarring, atelectasis and/or infiltrate is seen along the anterolateral aspect of the right lung base. Hepatobiliary: There is mild, stable hepatomegaly. No focal liver abnormality is seen. No gallstones, gallbladder wall thickening, or biliary dilatation. Pancreas: Unremarkable. No pancreatic ductal dilatation or surrounding inflammatory changes. Spleen: Normal in size without focal abnormality. Adrenals/Urinary Tract: Adrenal glands are  unremarkable. The native kidneys are atrophic in size, without renal calculi, focal lesion, or hydronephrosis. The urinary bladder is poorly distended and subsequently limited in evaluation. Stomach/Bowel: Stomach is within normal limits. Appendix appears normal. A large stool burden is noted throughout the colon. No evidence of bowel wall thickening, distention, or inflammatory changes. Vascular/Lymphatic: Aortic atherosclerosis. Numerous stable para-aortic and aortocaval lymph nodes are seen. Reproductive: A right sided tubal ligation clip is seen. A migrated left-sided tubal ligation clip is suspected within the mid left abdomen. The bilateral adnexa and uterus are otherwise unremarkable. Other: No abdominal wall hernia or abnormality. There is moderate severity abdominopelvic ascites. Musculoskeletal: No acute or significant osseous findings. IMPRESSION: 1. Moderate severity abdominopelvic ascites. 2. Stable moderate severity cardiomegaly with a very small pericardial effusion. 3. Stable mild to moderate severity anterolateral right basilar scarring, atelectasis and/or infiltrate. 4. Large stool burden without evidence of bowel obstruction. 5. Aortic atherosclerosis.  Aortic Atherosclerosis (ICD10-I70.0). Electronically Signed   By: Aram Candela M.D.   On: 02/01/2024 21:18   DG Chest Portable 1 View Result Date: 02/01/2024 CLINICAL DATA:  Ascites and shortness of breath EXAM: PORTABLE CHEST 1 VIEW COMPARISON:  Chest radiograph dated 09/16/2023 FINDINGS: Patient is rotated slightly to the right. Slightly low lung volumes. Minimal bilateral interstitial opacities. Trace bilateral pleural effusions. Pneumothorax. Similar enlarged cardiomediastinal silhouette. No acute osseous abnormality. IMPRESSION: 1. Minimal bilateral interstitial opacities, likely pulmonary edema. 2. Trace bilateral pleural effusions. 3. Similar cardiomegaly. Electronically Signed   By: Agustin Cree M.D.   On: 02/01/2024 14:41   US  Paracentesis Result Date: 02/01/2024 INDICATION: 43 year old female referred for paracentesis EXAM: ULTRASOUND GUIDED  PARACENTESIS MEDICATIONS: None. COMPLICATIONS: None PROCEDURE: Informed written consent was obtained from the patient after a discussion of the risks, benefits and alternatives to treatment. A timeout was performed prior to the initiation of the procedure. Initial ultrasound scanning demonstrates a small amount of ascites within the right lower abdominal quadrant. The right lower abdomen was prepped and draped in the usual sterile fashion. 1% lidocaine was used for local anesthesia. Following this, a 8 Fr Safe-T-Centesis catheter was introduced. An ultrasound image was saved for documentation purposes. The paracentesis was performed. The catheter was removed and a dressing was applied. The patient tolerated the procedure well without immediate post procedural complication. FINDINGS: A total of approximately 350 cc of blood tinged thin fluid was removed. Samples were sent to the laboratory as requested by the clinical team. IMPRESSION: Status post ultrasound-guided paracentesis Signed, Yvone Neu. Miachel Roux, RPVI [facility] Electronically Signed   By: Gilmer Mor D.O.   On: 02/01/2024 14:07    Subjective: Complaining of pain  Discharge Exam: Vitals:   02/24/24 0808 02/24/24 0830  BP: (!) 163/98 (!) 154/105  Pulse: (!) 59 (!) 51  Resp: 20 16  Temp:    SpO2: 100% 96%   Vitals:   02/24/24 0743 02/24/24 0800 02/24/24 0808 02/24/24 0830  BP:  (!) 153/99 (!) 163/98 (!) 154/105  Pulse:  (!) 53 (!) 59 (!) 51  Resp: (P) 16 16 20 16   Temp: (P) 99.1 F (37.3 C) 99.1 F (37.3 C)    TempSrc:      SpO2:  97% 100% 96%  Weight:  68.1 kg    Height:        General: Pt is alert, awake, not in acute distress Cardiovascular: RRR, S1/S2 +, no rubs, no gallops Respiratory: CTA bilaterally, no wheezing, no rhonchi Abdominal:  Soft, NT, ND, bowel sounds + Extremities: no edema, no cyanosis    The results of significant diagnostics from this hospitalization (including imaging, microbiology, ancillary and laboratory) are listed below for reference.     Microbiology: Recent Results (from the past 240 hours)  Blood culture (routine x 2)     Status: None (Preliminary result)   Collection Time: 02/20/24 10:57 AM   Specimen: BLOOD  Result Value Ref Range Status   Specimen Description BLOOD  Final   Special Requests NONE  Final   Culture   Final    NO GROWTH 4 DAYS Performed at [facility], [address] [place], [place] [address]    Report Status PENDING  Incomplete  Blood culture (routine x 2)     Status: None (Preliminary result)   Collection Time: 02/20/24 12:17 PM   Specimen: BLOOD RIGHT ARM  Result Value Ref Range Status   Specimen Description  Final    BLOOD RIGHT ARM BOTTLES DRAWN AEROBIC AND ANAEROBIC   Special Requests Blood Culture adequate volume  Final   Culture   Final    NO GROWTH 4 DAYS Performed at [facility], [address] [place], [place] [address]    Report Status PENDING  Incomplete     Labs: BNP (last 3 results) No results for input(s): "BNP" in the last 8760 hours. Basic Metabolic Panel: Recent Labs  Lab 02/20/24 1055 02/21/24 0527 02/22/24 0803 02/23/24 0636 02/24/24 0626  NA 140 139 139 138 137  K 5.4* 5.1 5.5* 4.1 4.5  CL 95* 97* 97* 97* 96*  CO2 25 24 22 24 24   GLUCOSE 102* 88 77 118* 95  BUN 76* 84* 98* 57* 65*  CREATININE 14.87* 15.71* 17.51* 11.58* 13.41*  CALCIUM 9.2 9.1 9.1 8.7* 8.8*  MG  --  2.4 2.5* 2.1 2.3  PHOS  --   --  >30.0* 8.0* 9.1*   Liver Function Tests: Recent Labs  Lab 02/20/24 1055 02/22/24 0803 02/23/24 0636 02/24/24 0626  AST 15  --   --   --   ALT 12  --   --   --   ALKPHOS 148*  --   --   --   BILITOT 0.9  --   --   --   PROT 6.4*  --   --   --   ALBUMIN 3.0* 2.8* 2.6* 2.7*   No results for input(s): "LIPASE", "AMYLASE"  in the last 168 hours. No results for input(s): "AMMONIA" in the last 168 hours. CBC: Recent Labs  Lab 02/20/24 1055 02/21/24 0527 02/22/24 0803 02/23/24 0636 02/24/24 0626  WBC 4.0 3.9* 4.0 3.3* 3.4*  NEUTROABS 3.0  --   --   --   --   HGB 10.0* 10.5* 9.8* 9.7* 10.1*  HCT 32.4* 33.3* 31.0* 30.0* 31.5*  MCV 97.3 96.8 94.5 93.2 93.8  PLT 87* 80* 74* 82* 84*   Cardiac Enzymes: No results for input(s): "CKTOTAL", "CKMB", "CKMBINDEX", "TROPONINI" in the last 168 hours. BNP: Invalid input(s): "POCBNP" CBG: No results for input(s): "GLUCAP" in the last 168 hours. D-Dimer No results for input(s): "DDIMER" in the last 72 hours. Hgb A1c No results for input(s): "HGBA1C" in the last 72 hours. Lipid Profile No results for input(s): "CHOL", "HDL", "LDLCALC", "TRIG", "CHOLHDL", "LDLDIRECT" in the last 72 hours. Thyroid function studies No results for input(s): "TSH", "T4TOTAL", "T3FREE", "THYROIDAB" in the last 72 hours.  Invalid input(s): "FREET3" Anemia work up No results for input(s): "VITAMINB12", "FOLATE", "FERRITIN", "TIBC", "IRON", "RETICCTPCT" in the last 72 hours. Urinalysis    Component Value Date/Time   COLORURINE YELLOW 07/05/2023 1146   APPEARANCEUR Clear 01/29/2024 1022   LABSPEC 1.013 07/05/2023 1146   PHURINE 8.0 07/05/2023 1146   GLUCOSEU Negative 01/29/2024 1022   HGBUR NEGATIVE 07/05/2023 1146   BILIRUBINUR Negative 01/29/2024 1022   KETONESUR NEGATIVE 07/05/2023 1146   PROTEINUR Negative 01/29/2024 1022   PROTEINUR >=300 (A) 07/05/2023 1146   UROBILINOGEN 0.2 10/17/2020 1026   NITRITE Negative 01/29/2024 1022   NITRITE NEGATIVE 07/05/2023 1146   LEUKOCYTESUR Negative 01/29/2024 1022   LEUKOCYTESUR SMALL (A) 07/05/2023 1146   Sepsis Labs Recent Labs  Lab 02/21/24 0527 02/22/24 0803 02/23/24 0636 02/24/24 0626  WBC 3.9* 4.0 3.3* 3.4*   Microbiology Recent Results (from the past 240 hours)  Blood culture (routine x 2)     Status: None (Preliminary  result)   Collection Time: 02/20/24 10:57  AM   Specimen: BLOOD  Result Value Ref Range Status   Specimen Description BLOOD  Final   Special Requests NONE  Final   Culture   Final    NO GROWTH 4 DAYS Performed at [facility], [address], [place], [place] [address]    Report Status PENDING  Incomplete  Blood culture (routine x 2)     Status: None (Preliminary result)   Collection Time: 02/20/24 12:17 PM   Specimen: BLOOD RIGHT ARM  Result Value Ref Range Status   Specimen Description   Final    BLOOD RIGHT ARM BOTTLES DRAWN AEROBIC AND ANAEROBIC   Special Requests Blood Culture adequate volume  Final   Culture   Final    NO GROWTH 4 DAYS Performed at [facility], [address] [place], [place] [address]    Report Status PENDING  Incomplete     Time coordinating discharge: Over 35 minutes  SIGNED:   Marinda Elk, MD  [facility] 02/24/2024, 9:01 AM Pager   If 7PM-7AM, please contact night-coverage [email or web address] Password [email or web address]

## 2024-02-24 NOTE — Plan of Care (Signed)

## 2024-02-24 NOTE — Progress Notes (Signed)
 Patient refuses heparin due for 0600 claiming this nurse has given it at 0300, explained to patient that the only medicine I gave her 0314 was her dilaudid and then she went to the bathroom to urinate and this nurse change her beddings and gown and fixed her telemetry, No heparin was administered at that time as it was every 8 hours. Heparin returned to pyxis due to refusal and notified Chinita Greenland NP.

## 2024-02-24 NOTE — Plan of Care (Signed)
  Problem: Clinical Measurements: Goal: Will remain free from infection Outcome: Not Progressing   Problem: Pain Managment: Goal: General experience of comfort will improve and/or be controlled Outcome: Not Progressing   Problem: Safety: Goal: Ability to remain free from injury will improve Outcome: Not Progressing

## 2024-02-24 NOTE — Progress Notes (Signed)
 Received patient in bed to unit.  Alert and oriented.  Informed consent signed and in chart.   TX duration: 3.5 hours   Patient tolerated well.  Transported back to the room  Alert, without acute distress.  Hand-off given to patient's nurse. Marylene Buerger RN  Access used: Left upper internal jugular (CVC) Access issues: None  Total UF removed: 3500 Medication(s) given: None Post HD weight: 65.3 Kg Post HD VS: 175/91, MAP 111, 17 Resp, 100 % on 2L per N/C, 52 Pulse, No c/o pain at this time    [facility]

## 2024-02-24 NOTE — Discharge Planning (Signed)
 [facility] Patient Discharge Orders- [facility] CLINIC: [facility]  Patient's name: Cathy [name] Admit/DC Dates: 02/20/2024 - 02/24/2024  Discharge Diagnoses: LUE Cellulitis - On PO ABXs now, see below. Access malfunction - See below  Aranesp: Given: No    Last Hgb: 10.1 PRBC's Given: No  ESA dose for discharge: mircera 225 mcg IV q 2 weeks  IV Iron dose at discharge: N/A  Heparin change: No  EDW Change: No. Please note, she received an extra HD today (02/24/24). Resume HD tomorrow to get her back on her routine schedule. Notify renal team if she comes in under tomorrow so we can adjust.  Bath Change: No  Access intervention/Change: Yes Details: Swelling/ cellulitis LUA. S/p F'gram and left TDC placement 3/24 by Dr. Randie Heinz. Per Dr. Randie Heinz, F'gram was negative for central stenosis. AVF is widely patent and pain/swelling 2nd to infiltration. Use the TDC until the infiltration resolves.  Hectorol change: No  Discharge Labs: Calcium 8.8 Phosphorus 9.1 Albumin 2.7 K+ 4.5  IV Antibiotics: No Details: LUE Cellulitis. Empirically treated here with IV Rocephin. On PO Doxycycline  On Coumadin?: No    D/C Meds to be reconciled by nurse after every discharge.  Completed By: Salome Holmes, NP   Reviewed by: MD:______ RN_______

## 2024-02-24 NOTE — Procedures (Signed)
 I was present at this dialysis session. I have reviewed the session itself and made appropriate changes.   Vital signs in last 24 hours:  Temp:  [97.3 F (36.3 C)-99.1 F (37.3 C)] 99.1 F (37.3 C) (03/26 0800) Pulse Rate:  [53-59] 59 (03/26 0808) Resp:  [16-20] 20 (03/26 0808) BP: (126-172)/(92-100) 163/98 (03/26 0808) SpO2:  [97 %-100 %] 100 % (03/26 0808) Weight:  [68.1 kg] 68.1 kg (03/26 0800) Weight change:  Filed Weights   02/20/24 1051 02/24/24 0800  Weight: 66.2 kg 68.1 kg    Recent Labs  Lab 02/24/24 0626  NA 137  K 4.5  CL 96*  CO2 24  GLUCOSE 95  BUN 65*  CREATININE 13.41*  CALCIUM 8.8*  PHOS 9.1*    Recent Labs  Lab 02/20/24 1055 02/21/24 0527 02/22/24 0803 02/23/24 0636 02/24/24 0626  WBC 4.0   < > 4.0 3.3* 3.4*  NEUTROABS 3.0  --   --   --   --   HGB 10.0*   < > 9.8* 9.7* 10.1*  HCT 32.4*   < > 31.0* 30.0* 31.5*  MCV 97.3   < > 94.5 93.2 93.8  PLT 87*   < > 74* 82* 84*   < > = values in this interval not displayed.    Scheduled Meds:  amLODipine  10 mg Oral Daily   busPIRone  30 mg Oral BID   carvedilol  25 mg Oral BID WC   cephALEXin  250 mg Oral Q12H   Chlorhexidine Gluconate Cloth  6 each Topical Q0600   cloNIDine  0.3 mg Oral TID   heparin  5,000 Units Subcutaneous Q8H   hydrALAZINE  100 mg Oral TID   losartan  50 mg Oral QHS   metroNIDAZOLE  500 mg Oral Q12H   pantoprazole  40 mg Oral QAC breakfast   sevelamer carbonate  800 mg Oral TID WC   topiramate  50 mg Oral Daily   Continuous Infusions:  anticoagulant sodium citrate     PRN Meds:.acetaminophen **OR** acetaminophen, alteplase, alteplase, anticoagulant sodium citrate, camphor-menthol, heparin, heparin, HYDROmorphone (DILAUDID) injection, lidocaine (PF), lidocaine (PF), lidocaine-prilocaine, lidocaine-prilocaine, methocarbamol, ondansetron **OR** ondansetron (ZOFRAN) IV, oxyCODONE, pentafluoroprop-tetrafluoroeth, pentafluoroprop-tetrafluoroeth   Irena Cords,   MD 02/24/2024, 8:38 AM

## 2024-02-25 ENCOUNTER — Telehealth: Payer: Self-pay

## 2024-02-25 LAB — CULTURE, BLOOD (ROUTINE X 2)
Culture: NO GROWTH
Special Requests: ADEQUATE

## 2024-02-25 NOTE — Transitions of Care (Post Inpatient/ED Visit) (Signed)
   02/25/2024  Name: Cathy [name] MRN: [id number] DOB: 1981-08-16  Today's TOC FU Call Status: Today's TOC FU Call Status:: Successful TOC FU Call Completed TOC FU Call Complete Date: 02/25/24 Patient's Name and Date of Birth confirmed.  Transition Care Management Follow-up Telephone Call Date of Discharge: 02/24/24 Discharge Facility: [facility]) Type of Discharge: Inpatient Admission Primary Inpatient Discharge Diagnosis:: Cellulitis How have you been since you were released from the hospital?: Same Any questions or concerns?: Yes Patient Questions/Concerns:: " I am mad that my doctor did not think I needed more pain medications and I was pushed out the door" Patient Questions/Concerns Addressed: Other: (attempted to discuss with patient and she hung up and said have a nice day.)  Placed call to patient and explained reason for call.  Patient voices that she is not happy that she did not get more pain medications and that she pushed out of the hospital.  I attempted to discuss medications with patient and she stated have a nice day and hung up on me.   No additional assessments completed.  Lonia Chimera, RN, BSN, CEN [employer]- Transition of Care Team.  [facility] [phone]

## 2024-02-25 NOTE — Transitions of Care (Post Inpatient/ED Visit) (Signed)
   02/25/2024  Name: Cathy [name] MRN: [id number] DOB: 04-07-1981  Today's TOC FU Call Status:    Attempted to reach the patient regarding the most recent Inpatient/ED visit.  Follow Up Plan: Additional outreach attempts will be made to reach the patient to complete the Transitions of Care (Post Inpatient/ED visit) call.   Lonia Chimera, RN, BSN, CEN [employer]- Transition of Care Team.  [facility] [phone]

## 2024-02-25 NOTE — Discharge Planning (Addendum)
 Transition of Care - Initial Contact from Inpatient Facility  Date of discharge: 02/24/24 Date of contact: 02/25/24 Method: Phone Spoke to: Patient  Patient contacted to discuss transition of care from recent inpatient hospitalization. Patient was admitted to [facility] from 02/20/24-02/24/24 with discharge diagnosis of LUE cellulitis and access malfunction. S/p new TDC placement.  The discharge medication list was reviewed. Patient understands the changes and has no concerns.   Patient will return to her outpatient HD unit on: Saturday 02/27/24  Cathy Holmes, NP

## 2024-02-26 ENCOUNTER — Ambulatory Visit: Payer: Medicare HMO | Admitting: Urology

## 2024-02-28 ENCOUNTER — Encounter (HOSPITAL_COMMUNITY): Payer: Self-pay | Admitting: Emergency Medicine

## 2024-02-28 ENCOUNTER — Emergency Department (HOSPITAL_COMMUNITY)

## 2024-02-28 ENCOUNTER — Emergency Department (HOSPITAL_COMMUNITY)
Admission: EM | Admit: 2024-02-28 | Discharge: 2024-02-28 | Disposition: A | Discharge status: Home / Self Care | Attending: Emergency Medicine | Admitting: Emergency Medicine

## 2024-02-28 ENCOUNTER — Other Ambulatory Visit: Payer: Self-pay

## 2024-02-28 DIAGNOSIS — R0601 Orthopnea: Secondary | ICD-10-CM | POA: Insufficient documentation

## 2024-02-28 DIAGNOSIS — R110 Nausea: Secondary | ICD-10-CM | POA: Diagnosis not present

## 2024-02-28 DIAGNOSIS — I129 Hypertensive chronic kidney disease with stage 1 through stage 4 chronic kidney disease, or unspecified chronic kidney disease: Secondary | ICD-10-CM | POA: Diagnosis not present

## 2024-02-28 DIAGNOSIS — R0989 Other specified symptoms and signs involving the circulatory and respiratory systems: Secondary | ICD-10-CM | POA: Diagnosis not present

## 2024-02-28 DIAGNOSIS — N189 Chronic kidney disease, unspecified: Secondary | ICD-10-CM | POA: Diagnosis not present

## 2024-02-28 DIAGNOSIS — D649 Anemia, unspecified: Secondary | ICD-10-CM | POA: Insufficient documentation

## 2024-02-28 DIAGNOSIS — R600 Localized edema: Secondary | ICD-10-CM | POA: Diagnosis not present

## 2024-02-28 DIAGNOSIS — Z79899 Other long term (current) drug therapy: Secondary | ICD-10-CM | POA: Diagnosis not present

## 2024-02-28 DIAGNOSIS — I517 Cardiomegaly: Secondary | ICD-10-CM | POA: Diagnosis not present

## 2024-02-28 DIAGNOSIS — J90 Pleural effusion, not elsewhere classified: Secondary | ICD-10-CM | POA: Diagnosis not present

## 2024-02-28 DIAGNOSIS — Z992 Dependence on renal dialysis: Secondary | ICD-10-CM | POA: Insufficient documentation

## 2024-02-28 DIAGNOSIS — R109 Unspecified abdominal pain: Secondary | ICD-10-CM | POA: Diagnosis not present

## 2024-02-28 DIAGNOSIS — J810 Acute pulmonary edema: Secondary | ICD-10-CM | POA: Diagnosis not present

## 2024-02-28 DIAGNOSIS — R0600 Dyspnea, unspecified: Secondary | ICD-10-CM | POA: Diagnosis not present

## 2024-02-28 LAB — CBC WITH DIFFERENTIAL/PLATELET
Abs Immature Granulocytes: 0.04 10*3/uL (ref 0.00–0.07)
Basophils Absolute: 0.1 10*3/uL (ref 0.0–0.1)
Basophils Relative: 1 %
Eosinophils Absolute: 0.2 10*3/uL (ref 0.0–0.5)
Eosinophils Relative: 4 %
HCT: 31.8 % — ABNORMAL LOW (ref 36.0–46.0)
Hemoglobin: 9.9 g/dL — ABNORMAL LOW (ref 12.0–15.0)
Immature Granulocytes: 1 %
Lymphocytes Relative: 16 %
Lymphs Abs: 0.6 10*3/uL — ABNORMAL LOW (ref 0.7–4.0)
MCH: 29.7 pg (ref 26.0–34.0)
MCHC: 31.1 g/dL (ref 30.0–36.0)
MCV: 95.5 fL (ref 80.0–100.0)
Monocytes Absolute: 0.4 10*3/uL (ref 0.1–1.0)
Monocytes Relative: 9 %
Neutro Abs: 2.8 10*3/uL (ref 1.7–7.7)
Neutrophils Relative %: 69 %
Platelets: 107 10*3/uL — ABNORMAL LOW (ref 150–400)
RBC: 3.33 MIL/uL — ABNORMAL LOW (ref 3.87–5.11)
RDW: 17.3 % — ABNORMAL HIGH (ref 11.5–15.5)
WBC: 4.1 10*3/uL (ref 4.0–10.5)
nRBC: 0 % (ref 0.0–0.2)

## 2024-02-28 LAB — MAGNESIUM: Magnesium: 2.5 mg/dL — ABNORMAL HIGH (ref 1.7–2.4)

## 2024-02-28 LAB — COMPREHENSIVE METABOLIC PANEL WITH GFR
ALT: 13 U/L (ref 0–44)
AST: 20 U/L (ref 15–41)
Albumin: 3 g/dL — ABNORMAL LOW (ref 3.5–5.0)
Alkaline Phosphatase: 145 U/L — ABNORMAL HIGH (ref 38–126)
Anion gap: 16 — ABNORMAL HIGH (ref 5–15)
BUN: 74 mg/dL — ABNORMAL HIGH (ref 6–20)
CO2: 24 mmol/L (ref 22–32)
Calcium: 8.8 mg/dL — ABNORMAL LOW (ref 8.9–10.3)
Chloride: 99 mmol/L (ref 98–111)
Creatinine, Ser: 14.9 mg/dL — ABNORMAL HIGH (ref 0.44–1.00)
GFR, Estimated: 3 mL/min — ABNORMAL LOW (ref >60)
Glucose, Bld: 97 mg/dL (ref 70–99)
Potassium: 4.5 mmol/L (ref 3.5–5.1)
Sodium: 139 mmol/L (ref 135–145)
Total Bilirubin: 1.2 mg/dL (ref 0.0–1.2)
Total Protein: 6.4 g/dL — ABNORMAL LOW (ref 6.5–8.1)

## 2024-02-28 MED ORDER — ONDANSETRON HCL 4 MG/2ML IJ SOLN
4.0000 mg | Freq: Once | INTRAMUSCULAR | Status: AC
  Administered 2024-02-28: 4 mg via INTRAVENOUS
  Filled 2024-02-28: qty 2

## 2024-02-28 MED ORDER — CHLORHEXIDINE GLUCONATE CLOTH 2 % EX PADS: 6.0000 | MEDICATED_PAD | Freq: Every day | CUTANEOUS | Status: DC

## 2024-02-28 MED ORDER — MORPHINE SULFATE (PF) 4 MG/ML IV SOLN
2.0000 mg | Freq: Once | INTRAVENOUS | Status: AC
  Administered 2024-02-28: 2 mg via INTRAVENOUS
  Filled 2024-02-28: qty 1

## 2024-02-28 NOTE — Discharge Instructions (Signed)
 Please return tomorrow for dialysis.  Return to emergency department immediately for any new or worsening symptoms.

## 2024-02-28 NOTE — ED Triage Notes (Addendum)
 Pt states she needs to have dialysis, the last time she had dialysis was 02/24/24. Went to dialysis center this Saturday.   Reports she and the nurses had a conflict and the person in charge told her she had to leave and could not get dialysis there anymore. Having nausea, diarrhea and headaches. Also needs sutures removed from LT arm.

## 2024-02-28 NOTE — ED Notes (Signed)
 PA-C spoke with pt as pt stated she did not want to be admitted. Pt states she will come back tomorrow for dialysis per PA-C

## 2024-02-28 NOTE — ED Provider Notes (Signed)
 [place] EMERGENCY DEPARTMENT AT [facility] Provider Note   CSN: [id number] Arrival date & time: 02/28/24  7829     History  Chief Complaint  Patient presents with   Nausea    Cathy [name] is a 43 y.o. female.  Patient is a 43 year old female who presents to the emergency department stating that she needs dialysis.  Patient notes that she last had dialysis 4 days ago.  Patient notes that she did attempt dialysis yesterday but notes that she did get into an argument with one of the nurses there for and was directed not to return to their facility.  Patient notes that she has been experiencing increased edema to bilateral lower extremities and stomach.  She admits to associated orthopnea.  She also admits to some associated generalized pain.  She denies any associated vomiting or diarrhea.  She has had associated nausea.  She denies any dizziness, lightheadedness or syncope.        Home Medications Prior to Admission medications   Medication Sig Start Date End Date Taking? Authorizing Provider  acetaminophen (TYLENOL) 500 MG tablet Take 1,000 mg by mouth every 6 (six) hours as needed.    [provider]  albuterol (VENTOLIN HFA) 108 (90 Base) MCG/ACT inhaler Inhale 2 puffs into the lungs every 6 (six) hours as needed for wheezing or shortness of breath. 12/18/23   Del Nigel Berthold, FNP  amLODipine (NORVASC) 10 MG tablet Take 1 tablet (10 mg total) by mouth in the morning. 08/27/23   Del Nigel Berthold, FNP  Blood Pressure Monitoring (OMRON 3 SERIES BP MONITOR) DEVI Use as directed 2 times a day 08/27/23   [name], Tenna Child, FNP  budesonide-formoterol (SYMBICORT) 160-4.5 MCG/ACT inhaler Inhale 2 puffs into the lungs 2 (two) times daily. 12/18/23   [name], Tenna Child, FNP  busPIRone (BUSPAR) 30 MG tablet TAKE 1 TABLET (30 MG TOTAL) BY MOUTH IN THE MORNING AND AT BEDTIME. 01/11/24   [name], [place], FNP  camphor-menthol [facility])  lotion Apply topically as needed for itching. 02/23/24   Rolly Salter, MD  carvedilol (COREG) 25 MG tablet Take 1 tablet (25 mg total) by mouth 2 (two) times daily with a meal. 08/27/23   [name], Tenna Child, FNP  cloNIDine (CATAPRES) 0.3 MG tablet Take 1 tablet (0.3 mg total) by mouth 3 (three) times daily. 08/27/23   Del Nigel Berthold, FNP  Doxercalciferol (HECTOROL IV) Doxercalciferol (Hectorol) 02/09/24 02/07/25  [provider]  doxycycline (VIBRA-TABS) 100 MG tablet Take 1 tablet (100 mg total) by mouth 2 (two) times daily for 5 days. 02/23/24 02/28/24  Rolly Salter, MD  hydrALAZINE (APRESOLINE) 100 MG tablet Take 1 tablet (100 mg total) by mouth 3 (three) times daily. 08/27/23   Del Nigel Berthold, FNP  losartan (COZAAR) 50 MG tablet Take 50 mg by mouth at bedtime. 01/27/23   [provider]  methocarbamol (ROBAXIN) 500 MG tablet Take 1 tablet (500 mg total) by mouth every 8 (eight) hours as needed for muscle spasms. 02/23/24   Rolly Salter, MD  Methoxy PEG-Epoetin Beta (MIRCERA IJ)  01/12/24 01/10/25  [provider]  ondansetron (ZOFRAN) 4 MG tablet Take 1 tablet (4 mg total) by mouth every 8 (eight) hours as needed for nausea or vomiting. 02/23/24   Rolly Salter, MD  oxyCODONE-acetaminophen (PERCOCET/ROXICET) 5-325 MG tablet Take 1 tablet by mouth every 8 (eight) hours as needed for up to 5 days  for severe pain (pain score 7-10) or moderate pain (pain score 4-6). 02/23/24 02/28/24  Rolly Salter, MD  pantoprazole (PROTONIX) 40 MG tablet Take 1 tablet (40 mg total) by mouth daily before breakfast. 02/23/24 04/23/24  Rolly Salter, MD  propranolol (INDERAL) 10 MG tablet TAKE 1 TABLET BY MOUTH EVERY 12 HOURS AS NEEDED. 02/15/24   Del Nigel Berthold, FNP  sevelamer carbonate (RENVELA) 800 MG tablet Take 800 mg by mouth 3 (three) times daily. 10/08/23   [provider]  topiramate (TOPAMAX) 50 MG tablet TAKE 1 TABLET BY MOUTH EVERY DAY  12/21/23   Del Nigel Berthold, FNP  fenofibrate 160 MG tablet Take 1 tablet (160 mg total) by mouth daily. 12/31/20 01/20/21  Regalado, Jon Billings A, MD      Allergies    Dialyvite 800 [nephro-vite]    Review of Systems   Review of Systems  Respiratory:  Positive for shortness of breath.   Cardiovascular:  Positive for leg swelling.  All other systems reviewed and are negative.   Physical Exam Updated Vital Signs BP (!) 184/94   Pulse (!) 54   Temp 98.8 F (37.1 C) (Oral)   Resp 14   Ht 5\' 7"  (1.702 m)   Wt 66.2 kg   SpO2 98%   BMI 22.87 kg/m  Physical Exam Vitals and nursing note reviewed.  Constitutional:      Appearance: Normal appearance.  HENT:     Head: Normocephalic and atraumatic.     Nose: Nose normal.     Mouth/Throat:     Mouth: Mucous membranes are moist.  Eyes:     Extraocular Movements: Extraocular movements intact.     Conjunctiva/sclera: Conjunctivae normal.     Pupils: Pupils are equal, round, and reactive to light.  Cardiovascular:     Rate and Rhythm: Normal rate and regular rhythm.     Pulses: Normal pulses.     Heart sounds: Normal heart sounds. No murmur heard.    No gallop.  Pulmonary:     Effort: Pulmonary effort is normal. No respiratory distress.     Breath sounds: Rales present. No wheezing.  Abdominal:     General: Abdomen is flat. Bowel sounds are normal.     Palpations: Abdomen is soft.     Tenderness: There is abdominal tenderness.  Musculoskeletal:        General: Normal range of motion.     Cervical back: Normal range of motion and neck supple.     Right lower leg: Edema present.     Left lower leg: Edema present.  Skin:    General: Skin is warm and dry.     Findings: No bruising, erythema or rash.  Neurological:     General: No focal deficit present.     Mental Status: She is alert and oriented to person, place, and time. Mental status is at baseline.     Cranial Nerves: No cranial nerve deficit.     Sensory: No sensory  deficit.     Motor: No weakness.     Coordination: Coordination normal.     Gait: Gait normal.  Psychiatric:        Mood and Affect: Mood normal.        Behavior: Behavior normal.        Thought Content: Thought content normal.        Judgment: Judgment normal.     ED Results / Procedures / Treatments   Labs (all labs ordered  are listed, but only abnormal results are displayed) Labs Reviewed  COMPREHENSIVE METABOLIC PANEL WITH GFR - Abnormal; Notable for the following components:      Result Value   BUN 74 (*)    Creatinine, Ser 14.90 (*)    Calcium 8.8 (*)    Total Protein 6.4 (*)    Albumin 3.0 (*)    Alkaline Phosphatase 145 (*)    GFR, Estimated 3 (*)    Anion gap 16 (*)    All other components within normal limits  CBC WITH DIFFERENTIAL/PLATELET - Abnormal; Notable for the following components:   RBC 3.33 (*)    Hemoglobin 9.9 (*)    HCT 31.8 (*)    RDW 17.3 (*)    Platelets 107 (*)    Lymphs Abs 0.6 (*)    All other components within normal limits  MAGNESIUM - Abnormal; Notable for the following components:   Magnesium 2.5 (*)    All other components within normal limits    EKG None  Radiology DG Chest Port 1 View Result Date: 02/28/2024 CLINICAL DATA:  Dyspnea. EXAM: PORTABLE CHEST 1 VIEW COMPARISON:  02/20/2024 FINDINGS: There is a left IJ dialysis catheter with tips at the superior cavoatrial junction. No pneumothorax identified. Heart size appears normal. Scratch set stable cardiac enlargement. Small bilateral pleural effusions are unchanged in the interval. Pulmonary vascular congestion is unchanged. IMPRESSION: 1. Stable cardiac enlargement with persistent pulmonary vascular congestion and small bilateral pleural effusions. Electronically Signed   By: Signa Kell M.D.   On: 02/28/2024 10:47    Procedures Procedures    Medications Ordered in ED Medications  ondansetron (ZOFRAN) injection 4 mg (has no administration in time range)  morphine (PF) 4  MG/ML injection 2 mg (has no administration in time range)    ED Course/ Medical Decision Making/ A&P                                 Medical Decision Making Amount and/or Complexity of Data Reviewed Labs: ordered. Radiology: ordered.  Risk Prescription drug management.   This patient presents to the ED for concern of lower extremity edema, shortness of breath differential diagnosis includes electrolyte derangement, pulmonary edema, chronic kidney disease    Additional history obtained:  Additional history obtained from medical records External records from outside source obtained and reviewed including none   Lab Tests:  I Ordered, and personally interpreted labs.  The pertinent results include: Anemia, elevated creatinine, elevated magnesium, normal sodium, potassium, chloride   Imaging Studies ordered:  I ordered imaging studies including chest x-ray I independently visualized and interpreted imaging which showed color congestion I agree with the radiologist interpretation   Medicines ordered and prescription drug management:  I ordered medication including morphine, Zofran for pain and nausea Reevaluation of the patient after these medicines showed that the patient improved I have reviewed the patients home medicines and have made adjustments as needed   Problem List / ED Course:  Patient is doing very well at this time.  Did discuss patient case with Dr. Arrie Aran with nephrology who initially stated the patient can be admitted to the hospital service for dialysis tomorrow.  Patient notes that she does not want to be admitted at this time and notes that she would like to go home to care for her children.  Patient will return in the morning for dialysis.  Did read discuss patient case with Dr.  Claude Manges who does note that this is reasonable at this time.  Patient has stable vital signs and electrolytes are within normal limits.  Patient does not warrant emergent  dialysis at this point.  Patient will return immediately for any new or worsening symptoms.   Social Determinants of Health:  None           Final Clinical Impression(s) / ED Diagnoses Final diagnoses:  None    Rx / DC Orders ED Discharge Orders     None         Kathlen Mody 02/28/24 1205    Gloris Manchester, MD 02/28/24 [phone]

## 2024-02-28 NOTE — ED Notes (Signed)
 Endorses nausea, and pain in: abd, body, chest and HA.

## 2024-02-29 ENCOUNTER — Other Ambulatory Visit: Payer: Self-pay

## 2024-02-29 ENCOUNTER — Encounter (HOSPITAL_COMMUNITY): Payer: Self-pay | Admitting: *Deleted

## 2024-02-29 ENCOUNTER — Emergency Department (HOSPITAL_COMMUNITY)
Admission: EM | Admit: 2024-02-29 | Discharge: 2024-02-29 | Disposition: A | Discharge status: Against Medical Advice | Attending: Emergency Medicine | Admitting: Emergency Medicine

## 2024-02-29 DIAGNOSIS — Z992 Dependence on renal dialysis: Secondary | ICD-10-CM | POA: Diagnosis not present

## 2024-02-29 DIAGNOSIS — R110 Nausea: Secondary | ICD-10-CM | POA: Diagnosis not present

## 2024-02-29 DIAGNOSIS — I120 Hypertensive chronic kidney disease with stage 5 chronic kidney disease or end stage renal disease: Secondary | ICD-10-CM | POA: Diagnosis not present

## 2024-02-29 DIAGNOSIS — N186 End stage renal disease: Secondary | ICD-10-CM | POA: Diagnosis not present

## 2024-02-29 DIAGNOSIS — M3214 Glomerular disease in systemic lupus erythematosus: Secondary | ICD-10-CM | POA: Diagnosis not present

## 2024-02-29 DIAGNOSIS — R5383 Other fatigue: Secondary | ICD-10-CM | POA: Diagnosis not present

## 2024-02-29 MED ORDER — CHLORHEXIDINE GLUCONATE CLOTH 2 % EX PADS: 6.0000 | MEDICATED_PAD | Freq: Every day | CUTANEOUS | Status: DC

## 2024-02-29 MED ORDER — CLONIDINE HCL 0.1 MG PO TABS
0.1000 mg | ORAL_TABLET | Freq: Once | ORAL | Status: AC
  Administered 2024-02-29: 0.1 mg via ORAL
  Filled 2024-02-29: qty 1

## 2024-02-29 MED ORDER — AMLODIPINE BESYLATE 5 MG PO TABS
10.0000 mg | ORAL_TABLET | Freq: Once | ORAL | Status: AC
  Administered 2024-02-29: 10 mg via ORAL
  Filled 2024-02-29: qty 2

## 2024-02-29 MED ORDER — HYDRALAZINE HCL 25 MG PO TABS
100.0000 mg | ORAL_TABLET | Freq: Once | ORAL | Status: AC
  Administered 2024-02-29: 100 mg via ORAL
  Filled 2024-02-29: qty 4

## 2024-02-29 MED ORDER — ONDANSETRON 4 MG PO TBDP
4.0000 mg | ORAL_TABLET | Freq: Once | ORAL | Status: AC
Start: 2024-02-29 — End: 2024-02-29
  Administered 2024-02-29: 4 mg via ORAL
  Filled 2024-02-29: qty 1

## 2024-02-29 MED ORDER — HYDROCODONE-ACETAMINOPHEN 5-325 MG PO TABS
1.0000 | ORAL_TABLET | Freq: Once | ORAL | Status: AC
  Administered 2024-02-29: 1 via ORAL
  Filled 2024-02-29: qty 1

## 2024-02-29 MED ORDER — CARVEDILOL 12.5 MG PO TABS
25.0000 mg | ORAL_TABLET | Freq: Two times a day (BID) | ORAL | Status: DC
  Administered 2024-02-29: 25 mg via ORAL
  Filled 2024-02-29: qty 2

## 2024-02-29 NOTE — Progress Notes (Signed)
 Called by Dr. Adriana Simas, pt does not want to go back to her outpatient clinic following infiltration of her LUE AVF.  She missed HD on Saturday due to a verbal altercation with staff.  Will provide HD today but will need CM/SW assistance with placing her into [facility].

## 2024-02-29 NOTE — ED Triage Notes (Signed)
 Pt is requesting dialysis. She was seeing an outpt dialysis center but they will no longer see her so she is also requesting [place] to help find her a new dialysis center.

## 2024-02-29 NOTE — ED Provider Notes (Signed)
 [place] EMERGENCY DEPARTMENT AT [facility] Provider Note   CSN: [id number] Arrival date & time: 02/29/24  7829     History  No chief complaint on file.   Cathy [name] is a 43 y.o. female.  Patient is a 43 year old female who presents to the emergency department for dialysis.  She was evaluated in the emergency department yesterday by myself.  During which time I did speak with nephrology and patient was to be admitted for dialysis this morning but she was unable to stay secondary to family obligations.  Did touch base with nephrology yesterday and they did recommend that she return this morning for dialysis.  Patient admits to generalized fatigue at this point as well as nausea.        Home Medications Prior to Admission medications   Medication Sig Start Date End Date Taking? Authorizing Provider  acetaminophen (TYLENOL) 500 MG tablet Take 1,000 mg by mouth every 6 (six) hours as needed.    [provider]  albuterol (VENTOLIN HFA) 108 (90 Base) MCG/ACT inhaler Inhale 2 puffs into the lungs every 6 (six) hours as needed for wheezing or shortness of breath. 12/18/23   Del Nigel Berthold, FNP  amLODipine (NORVASC) 10 MG tablet Take 1 tablet (10 mg total) by mouth in the morning. 08/27/23   Del Nigel Berthold, FNP  Blood Pressure Monitoring (OMRON 3 SERIES BP MONITOR) DEVI Use as directed 2 times a day 08/27/23   [name], Tenna Child, FNP  budesonide-formoterol (SYMBICORT) 160-4.5 MCG/ACT inhaler Inhale 2 puffs into the lungs 2 (two) times daily. 12/18/23   [name], Tenna Child, FNP  busPIRone (BUSPAR) 30 MG tablet TAKE 1 TABLET (30 MG TOTAL) BY MOUTH IN THE MORNING AND AT BEDTIME. 01/11/24   [name], [place], FNP  camphor-menthol [facility]) lotion Apply topically as needed for itching. 02/23/24   Rolly Salter, MD  carvedilol (COREG) 25 MG tablet Take 1 tablet (25 mg total) by mouth 2 (two) times daily with a meal. 08/27/23   [name], Tenna Child, FNP  cloNIDine (CATAPRES) 0.3 MG tablet Take 1 tablet (0.3 mg total) by mouth 3 (three) times daily. 08/27/23   Del Nigel Berthold, FNP  Doxercalciferol (HECTOROL IV) Doxercalciferol (Hectorol) 02/09/24 02/07/25  [provider]  hydrALAZINE (APRESOLINE) 100 MG tablet Take 1 tablet (100 mg total) by mouth 3 (three) times daily. 08/27/23   Del Nigel Berthold, FNP  losartan (COZAAR) 50 MG tablet Take 50 mg by mouth at bedtime. 01/27/23   [provider]  methocarbamol (ROBAXIN) 500 MG tablet Take 1 tablet (500 mg total) by mouth every 8 (eight) hours as needed for muscle spasms. 02/23/24   Rolly Salter, MD  Methoxy PEG-Epoetin Beta (MIRCERA IJ)  01/12/24 01/10/25  [provider]  ondansetron (ZOFRAN) 4 MG tablet Take 1 tablet (4 mg total) by mouth every 8 (eight) hours as needed for nausea or vomiting. 02/23/24   Rolly Salter, MD  pantoprazole (PROTONIX) 40 MG tablet Take 1 tablet (40 mg total) by mouth daily before breakfast. 02/23/24 04/23/24  Rolly Salter, MD  propranolol (INDERAL) 10 MG tablet TAKE 1 TABLET BY MOUTH EVERY 12 HOURS AS NEEDED. 02/15/24   Del Nigel Berthold, FNP  sevelamer carbonate (RENVELA) 800 MG tablet Take 800 mg by mouth 3 (three) times daily. 10/08/23   [provider]  topiramate (TOPAMAX) 50 MG tablet TAKE 1 TABLET BY MOUTH EVERY DAY 12/21/23  [name], Tenna Child, FNP  fenofibrate 160 MG tablet Take 1 tablet (160 mg total) by mouth daily. 12/31/20 01/20/21  Regalado, Prentiss Bells, MD      Allergies    Dialyvite 800 [nephro-vite]    Review of Systems   Review of Systems  Gastrointestinal:  Positive for nausea.  All other systems reviewed and are negative.   Physical Exam Updated Vital Signs Ht 5\' 7"  (1.702 m)   Wt 66.2 kg   BMI 22.87 kg/m  Physical Exam Vitals and nursing note reviewed.  Constitutional:      Appearance: Normal appearance.  HENT:     Head: Normocephalic and atraumatic.      Nose: Nose normal.     Mouth/Throat:     Mouth: Mucous membranes are moist.  Eyes:     Extraocular Movements: Extraocular movements intact.     Conjunctiva/sclera: Conjunctivae normal.     Pupils: Pupils are equal, round, and reactive to light.  Cardiovascular:     Rate and Rhythm: Normal rate and regular rhythm.     Pulses: Normal pulses.     Heart sounds: No murmur heard.    No gallop.  Pulmonary:     Effort: Pulmonary effort is normal. No respiratory distress.     Breath sounds: Normal breath sounds. No stridor. No wheezing, rhonchi or rales.  Abdominal:     General: Abdomen is flat. Bowel sounds are normal. There is no distension.     Palpations: Abdomen is soft.     Tenderness: There is no abdominal tenderness. There is no guarding.  Musculoskeletal:        General: Normal range of motion.     Cervical back: Normal range of motion and neck supple.  Skin:    General: Skin is warm and dry.     Findings: No bruising or rash.  Neurological:     General: No focal deficit present.     Mental Status: She is alert and oriented to person, place, and time. Mental status is at baseline.  Psychiatric:        Mood and Affect: Mood normal.        Behavior: Behavior normal.        Thought Content: Thought content normal.        Judgment: Judgment normal.     ED Results / Procedures / Treatments   Labs (all labs ordered are listed, but only abnormal results are displayed) Labs Reviewed - No data to display  EKG None  Radiology DG Chest [facility] 1 View Result Date: 02/28/2024 CLINICAL DATA:  Dyspnea. EXAM: PORTABLE CHEST 1 VIEW COMPARISON:  02/20/2024 FINDINGS: There is a left IJ dialysis catheter with tips at the superior cavoatrial junction. No pneumothorax identified. Heart size appears normal. Scratch set stable cardiac enlargement. Small bilateral pleural effusions are unchanged in the interval. Pulmonary vascular congestion is unchanged. IMPRESSION: 1. Stable cardiac enlargement  with persistent pulmonary vascular congestion and small bilateral pleural effusions. Electronically Signed   By: Signa Kell M.D.   On: 02/28/2024 10:47    Procedures Procedures    Medications Ordered in ED Medications  ondansetron (ZOFRAN-ODT) disintegrating tablet 4 mg (has no administration in time range)    ED Course/ Medical Decision Making/ A&P Clinical Course as of 02/29/24 1514  Mon Feb 29, 2024  0858 Did not take her blood pressure medications this morning.  Did confirm that she takes Norvasc, Coreg, hydralazine and clonidine in the morning.  Occasions have been ordered. [CR]  Clinical Course User Index [CR] Lelon Perla, PA-C                                 Medical Decision Making Did discuss patient case with Dr. Arrie Aran who notes that he will take the patient to dialysis today.  Patient was provided with her home blood pressure medications as well as something for pain and nausea.  Vital signs did improve.  Prior to going to dialysis the patient noted that she was unable to stay as she had to pick up her children.  I was unable to speak with the patient prior to leaving the emergency department and she did sign out AGAINST MEDICAL ADVICE.  Patient was alert and oriented x 4 and was competent to refuse dialysis and refused further care.  Patient did tell the nursing staff that she will return tomorrow.  Amount and/or Complexity of Data Reviewed Labs: ordered.  Risk Prescription drug management.           Final Clinical Impression(s) / ED Diagnoses Final diagnoses:  None    Rx / DC Orders ED Discharge Orders     None         Lelon Perla, PA-C 02/29/24 1515    Pricilla Loveless, MD 03/01/24 [phone]

## 2024-02-29 NOTE — ED Notes (Signed)
 ED Provider at bedside.
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
## 2024-03-02 ENCOUNTER — Other Ambulatory Visit: Payer: Self-pay

## 2024-03-02 ENCOUNTER — Encounter (HOSPITAL_COMMUNITY): Payer: Self-pay

## 2024-03-02 ENCOUNTER — Emergency Department (HOSPITAL_COMMUNITY)
Admission: EM | Admit: 2024-03-02 | Discharge: 2024-03-02 | Disposition: A | Discharge status: Home / Self Care | Attending: Emergency Medicine | Admitting: Emergency Medicine

## 2024-03-02 ENCOUNTER — Emergency Department (HOSPITAL_COMMUNITY)

## 2024-03-02 DIAGNOSIS — R531 Weakness: Secondary | ICD-10-CM | POA: Diagnosis present

## 2024-03-02 DIAGNOSIS — I517 Cardiomegaly: Secondary | ICD-10-CM | POA: Diagnosis not present

## 2024-03-02 DIAGNOSIS — R0989 Other specified symptoms and signs involving the circulatory and respiratory systems: Secondary | ICD-10-CM | POA: Diagnosis not present

## 2024-03-02 DIAGNOSIS — N170 Acute kidney failure with tubular necrosis: Secondary | ICD-10-CM | POA: Insufficient documentation

## 2024-03-02 DIAGNOSIS — R079 Chest pain, unspecified: Secondary | ICD-10-CM | POA: Diagnosis not present

## 2024-03-02 LAB — TROPONIN I (HIGH SENSITIVITY)
Troponin I (High Sensitivity): 40 ng/L — ABNORMAL HIGH (ref <18)
Troponin I (High Sensitivity): 40 ng/L — ABNORMAL HIGH (ref <18)

## 2024-03-02 LAB — BASIC METABOLIC PANEL WITH GFR
Anion gap: 22 — ABNORMAL HIGH (ref 5–15)
BUN: 98 mg/dL — ABNORMAL HIGH (ref 6–20)
CO2: 20 mmol/L — ABNORMAL LOW (ref 22–32)
Calcium: 8.5 mg/dL — ABNORMAL LOW (ref 8.9–10.3)
Chloride: 98 mmol/L (ref 98–111)
Creatinine, Ser: 18.19 mg/dL — ABNORMAL HIGH (ref 0.44–1.00)
GFR, Estimated: 2 mL/min — ABNORMAL LOW (ref >60)
Glucose, Bld: 114 mg/dL — ABNORMAL HIGH (ref 70–99)
Potassium: 5.7 mmol/L — ABNORMAL HIGH (ref 3.5–5.1)
Sodium: 140 mmol/L (ref 135–145)

## 2024-03-02 LAB — CBC
HCT: 29.7 % — ABNORMAL LOW (ref 36.0–46.0)
Hemoglobin: 9.5 g/dL — ABNORMAL LOW (ref 12.0–15.0)
MCH: 30.2 pg (ref 26.0–34.0)
MCHC: 32 g/dL (ref 30.0–36.0)
MCV: 94.3 fL (ref 80.0–100.0)
Platelets: 87 10*3/uL — ABNORMAL LOW (ref 150–400)
RBC: 3.15 MIL/uL — ABNORMAL LOW (ref 3.87–5.11)
RDW: 17.2 % — ABNORMAL HIGH (ref 11.5–15.5)
WBC: 4.4 10*3/uL (ref 4.0–10.5)
nRBC: 0 % (ref 0.0–0.2)

## 2024-03-02 LAB — HEPATITIS B SURFACE ANTIGEN: Hepatitis B Surface Ag: NONREACTIVE

## 2024-03-02 MED ORDER — CHLORHEXIDINE GLUCONATE CLOTH 2 % EX PADS
6.0000 | MEDICATED_PAD | Freq: Every day | CUTANEOUS | Status: DC
  Administered 2024-03-02: 6 via TOPICAL

## 2024-03-02 MED ORDER — HEPARIN SODIUM (PORCINE) 1000 UNIT/ML IJ SOLN
INTRAMUSCULAR | Status: AC
  Filled 2024-03-02: qty 4

## 2024-03-02 MED ORDER — ONDANSETRON 4 MG PO TBDP
4.0000 mg | ORAL_TABLET | Freq: Once | ORAL | Status: AC
  Administered 2024-03-02: 4 mg via ORAL
  Filled 2024-03-02: qty 1

## 2024-03-02 MED ORDER — OXYCODONE-ACETAMINOPHEN 5-325 MG PO TABS
1.0000 | ORAL_TABLET | Freq: Once | ORAL | Status: DC
  Filled 2024-03-02: qty 1

## 2024-03-02 MED ORDER — TRAMADOL HCL 50 MG PO TABS
50.0000 mg | ORAL_TABLET | Freq: Once | ORAL | Status: AC
  Administered 2024-03-02: 50 mg via ORAL
  Filled 2024-03-02: qty 1

## 2024-03-02 MED ORDER — ONDANSETRON HCL 4 MG/2ML IJ SOLN
4.0000 mg | Freq: Once | INTRAMUSCULAR | Status: AC
  Administered 2024-03-02: 4 mg via INTRAVENOUS
  Filled 2024-03-02: qty 2

## 2024-03-02 MED ORDER — OXYCODONE-ACETAMINOPHEN 5-325 MG PO TABS
1.0000 | ORAL_TABLET | Freq: Once | ORAL | Status: AC
  Administered 2024-03-02: 1 via ORAL
  Filled 2024-03-02: qty 1
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
## 2024-03-02 NOTE — Procedures (Signed)
 HD Note:  Some information was entered later than the data was gathered due to patient care needs. The stated time with the data is accurate.  Patient treated at Bedside in the ED related to space in the Dialysis unit.  Patient chose to not stand to weigh.  She stated that she was unaware of her designated dry weight.  Alert and oriented.   Informed consent signed and in chart.   Access used: Upper left chest HD catheter Access issues: None  Patient tolerated treatment well.  Patient complained of itching before, during, and after treatment.  She declined benadryl.   TX duration: 3.5 hours  Alert, without acute distress.  Total UF removed: 3500 ml  Hand-off given to patient's nurse.     Miette Molenda L. Dareen Piano, RN Kidney Dialysis Unit.

## 2024-03-02 NOTE — ED Provider Notes (Signed)
 I was asked to the patient after dialysis.  She had full dialysis session and blood pressure was still elevated.  She is offered blood pressure medications and declined this.  She is requesting discharge.  She is discharged with return precautions. Physical Exam  BP (!) 208/114   Pulse 61   Temp 98.1 F (36.7 C) (Oral)   Resp 15   Ht 5\' 7"  (1.702 m)   Wt 66.2 kg   SpO2 99%   BMI 22.87 kg/m   Physical Exam  Procedures  Procedures  ED Course / MDM    Medical Decision Making Amount and/or Complexity of Data Reviewed Labs: ordered. Radiology: ordered.  Risk Prescription drug management.          Durwin Glaze, MD 03/02/24 [phone]

## 2024-03-02 NOTE — Discharge Instructions (Signed)
 Patient has been accepted to the dialysis center [facility] in [place].  She will need to be there on April 4 at 1015.  She will be signing some papers.  The contact phone number there is [phone]

## 2024-03-02 NOTE — ED Triage Notes (Signed)
 Pt was seen the other day and had to leave AMA. Pt is feeling week and full of fluid. Pt has not had dialysis in over a week. Per pt her dialysis center d/c her and she needs assistance find a new dialysis center.

## 2024-03-03 ENCOUNTER — Telehealth: Payer: Self-pay

## 2024-03-03 LAB — HEPATITIS B SURFACE ANTIBODY, QUANTITATIVE: Hep B S AB Quant (Post): 56.5 m[IU]/mL

## 2024-03-03 NOTE — Transitions of Care (Post Inpatient/ED Visit) (Signed)
 03/03/2024  Name: Cathy [name] MRN: [id number] DOB: [birth year]  Today's TOC FU Call Status: Today's TOC FU Call Status:: Successful TOC FU Call Completed TOC FU Call Complete Date: 03/03/24 Patient's Name and Date of Birth confirmed.  Transition Care Management Follow-up Telephone Call Date of Discharge: 03/02/24 [facility] Type of Discharge: Emergency Department Reason for ED Visit: Other: (Acute renal failure) How have you been since you were released from the hospital?: Same Any questions or concerns?: No  Items Reviewed: Did you receive and understand the discharge instructions provided?: Yes Medications obtained,verified, and reconciled?: Yes (Medications Reviewed) Any new allergies since your discharge?: No Dietary orders reviewed?: NA Do you have support at home?: No  Medications Reviewed Today: Medications Reviewed Today     Reviewed by Anthoney Harada, LPN (Licensed Practical Nurse) on 03/03/24 at 1438  Med List Status: <None>   Medication Order Taking? Sig Documenting Provider Last Dose Status Informant  acetaminophen (TYLENOL) 500 MG tablet [id number] Yes Take 1,000 mg by mouth every 6 (six) hours as needed. [provider] Taking Active Self  albuterol (VENTOLIN HFA) 108 (90 Base) MCG/ACT inhaler [id number] Yes Inhale 2 puffs into the lungs every 6 (six) hours as needed for wheezing or shortness of breath. Del Nigel Berthold, FNP Taking Active Self           Med Note (WARD, ANGELICA G   Sat Feb 20, 2024  2:48 PM) Pt is requesting a refill  amLODipine (NORVASC) 10 MG tablet [id number] No Take 1 tablet (10 mg total) by mouth in the morning.  Patient not taking: Reported on 03/03/2024   Rica Records, FNP Not Taking Active Self  Blood Pressure Monitoring (OMRON 3 SERIES BP MONITOR) DEVI [id number] Yes Use as directed 2 times a day [name], [place], FNP Taking Active Self  budesonide-formoterol (SYMBICORT)  160-4.5 MCG/ACT inhaler [id number] Yes Inhale 2 puffs into the lungs 2 (two) times daily. [name], Tenna Child, FNP Taking Active Self  busPIRone (BUSPAR) 30 MG tablet [id number] Yes TAKE 1 TABLET (30 MG TOTAL) BY MOUTH IN THE MORNING AND AT BEDTIME. Del Nigel Berthold, FNP Taking Active Self  camphor-menthol [facility]) lotion [id number] Yes Apply topically as needed for itching. Rolly Salter, MD Taking Active   carvedilol (COREG) 25 MG tablet [id number] Yes Take 1 tablet (25 mg total) by mouth 2 (two) times daily with a meal. [name], Tenna Child, FNP Taking Active Self  cloNIDine (CATAPRES) 0.3 MG tablet [id number] Yes Take 1 tablet (0.3 mg total) by mouth 3 (three) times daily. Del Nigel Berthold, FNP Taking Active Self  Doxercalciferol (HECTOROL IV) [id number] Yes Doxercalciferol (Hectorol) [provider] Taking Active Self           Med Note Elesa Massed, ANGELICA G   Sat Feb 20, 2024  2:49 PM) Pt doesn't think she is on this but is not sure   doxycycline (VIBRA-TABS) 100 MG tablet [id number] No Take 100 mg by mouth 2 (two) times daily.  Patient not taking: Reported on 03/03/2024   [provider] Not Taking Active     Discontinued 01/20/21 1853 (Stop Taking at Discharge)   hydrALAZINE (APRESOLINE) 100 MG tablet [id number] No Take 1 tablet (100 mg total) by mouth 3 (three) times daily.  Patient not taking: Reported on 03/03/2024   Rica Records, FNP Not Taking Active Self  losartan (COZAAR) 50 MG tablet [id number] No Take 50 mg by mouth  at bedtime.  Patient not taking: Reported on 03/03/2024   [provider] Not Taking Active Self  methocarbamol (ROBAXIN) 500 MG tablet [id number] Yes Take 1 tablet (500 mg total) by mouth every 8 (eight) hours as needed for muscle spasms. Rolly Salter, MD Taking Active   Methoxy PEG-Epoetin [facility] IJ) [id number] Yes  [provider] Taking Active Self           Med Note Elesa Massed, Illene Labrador   Sat Feb 20, 2024  2:48 PM) Pt doesn't think she is on this but is not sure  ondansetron (ZOFRAN) 4 MG tablet [id number] Yes Take 1 tablet (4 mg total) by mouth every 8 (eight) hours as needed for nausea or vomiting. Rolly Salter, MD Taking Active   pantoprazole (PROTONIX) 40 MG tablet [id number] Yes Take 1 tablet (40 mg total) by mouth daily before breakfast. Rolly Salter, MD Taking Active   propranolol (INDERAL) 10 MG tablet [id number] Yes TAKE 1 TABLET BY MOUTH EVERY 12 HOURS AS NEEDED. [name], Tenna Child, FNP Taking Active Self  sevelamer carbonate (RENVELA) 800 MG tablet [id number] Yes Take 800 mg by mouth 3 (three) times daily. [provider] Taking Active Self  topiramate (TOPAMAX) 50 MG tablet [id number] Yes TAKE 1 TABLET BY MOUTH EVERY DAY Del Nigel Berthold, FNP Taking Active Self  Med List Note Dianah Field 03/23/23 7564): Dialysis tu, th, Sat.            Home Care and Equipment/Supplies: Were Home Health Services Ordered?: NA Any new equipment or medical supplies ordered?: NA  Functional Questionnaire: Do you need assistance with bathing/showering or dressing?: No Do you need assistance with meal preparation?: No Do you need assistance with eating?: No Do you have difficulty maintaining continence: No Do you need assistance with getting out of bed/getting out of a chair/moving?: No Do you have difficulty managing or taking your medications?: No  Follow up appointments reviewed: PCP Follow-up appointment confirmed?: Yes Date of PCP follow-up appointment?: 03/10/24 Follow-up Provider: Lonna Cobb [facility] Follow-up appointment confirmed?: NA Do you need transportation to your follow-up appointment?: No Do you understand care options if your condition(s) worsen?: Yes-patient verbalized understanding    SIGNATURE Kandis Fantasia, LPN [facility] Health Advisor [place] [facility] You Are. We Are. [facility] Direct Dial  [phone]

## 2024-03-07 DIAGNOSIS — Z992 Dependence on renal dialysis: Secondary | ICD-10-CM | POA: Diagnosis not present

## 2024-03-07 DIAGNOSIS — N186 End stage renal disease: Secondary | ICD-10-CM | POA: Diagnosis not present

## 2024-03-09 DIAGNOSIS — N186 End stage renal disease: Secondary | ICD-10-CM | POA: Diagnosis not present

## 2024-03-09 DIAGNOSIS — Z992 Dependence on renal dialysis: Secondary | ICD-10-CM | POA: Diagnosis not present

## 2024-03-09 NOTE — Progress Notes (Unsigned)
   Established Patient Office Visit   Subjective  Patient ID: Cathy [name], female    DOB: 11-16-1981  Age: 43 y.o. MRN: [id number]  No chief complaint on file.   She  has a past medical history of Anasarca associated with disorder of kidney (06/08/2021), Anxiety, Asthma, Bipolar depression (HCC), Depression, Dyspnea, ESRD (end stage renal disease) on dialysis (HCC) (10/2020), GERD (gastroesophageal reflux disease), Gonorrhea, Lupus, Pelvic inflammatory disease (PID), Pleural effusion (06/08/2021), Pulmonary embolism (HCC) (01/31/2022), Renal hypertension, and Trichomonas infection.  HPI  ROS    Objective:     There were no vitals taken for this visit. {Vitals History (Optional):23777}  Physical Exam   No results found for any visits on 03/10/24.  The 10-year ASCVD risk score (Arnett DK, et al., 2019) is: 47.1%    Assessment & Plan:  There are no diagnoses linked to this encounter.  No follow-ups on file.   Cruzita Lederer Newman Nip, FNP

## 2024-03-09 NOTE — Patient Instructions (Signed)

## 2024-03-09 NOTE — ED Provider Notes (Signed)
 [place] EMERGENCY DEPARTMENT AT [facility] Provider Note   CSN: [id number] Arrival date & time: 03/02/24  6010     History  Chief Complaint  Patient presents with   Needs Dialysis    Cathy [name] is a 43 y.o. female.  Patient with weakness and feels full of fluid.   She has not had dialysis in a week  The history is provided by the patient and medical records. No language interpreter was used.  Weakness Severity:  Moderate Onset quality:  Sudden Timing:  Constant Progression:  Worsening Chronicity:  New Context: not alcohol use   Relieved by:  Nothing Worsened by:  Nothing Ineffective treatments:  None tried Associated symptoms: no abdominal pain, no chest pain, no cough, no diarrhea, no frequency, no headaches and no seizures   Risk factors: no anemia        Home Medications Prior to Admission medications   Medication Sig Start Date End Date Taking? Authorizing Provider  acetaminophen (TYLENOL) 500 MG tablet Take 1,000 mg by mouth every 6 (six) hours as needed.    [provider]  albuterol (VENTOLIN HFA) 108 (90 Base) MCG/ACT inhaler Inhale 2 puffs into the lungs every 6 (six) hours as needed for wheezing or shortness of breath. 12/18/23   Del Nigel Berthold, FNP  amLODipine (NORVASC) 10 MG tablet Take 1 tablet (10 mg total) by mouth in the morning. Patient not taking: Reported on 03/03/2024 08/27/23   Del Nigel Berthold, FNP  Blood Pressure Monitoring (OMRON 3 SERIES BP MONITOR) DEVI Use as directed 2 times a day 08/27/23   [name], Tenna Child, FNP  budesonide-formoterol (SYMBICORT) 160-4.5 MCG/ACT inhaler Inhale 2 puffs into the lungs 2 (two) times daily. 12/18/23   [name], Tenna Child, FNP  busPIRone (BUSPAR) 30 MG tablet TAKE 1 TABLET (30 MG TOTAL) BY MOUTH IN THE MORNING AND AT BEDTIME. 01/11/24   [name], [place], FNP  camphor-menthol [facility]) lotion Apply topically as needed for itching. 02/23/24   Rolly Salter,  MD  carvedilol (COREG) 25 MG tablet Take 1 tablet (25 mg total) by mouth 2 (two) times daily with a meal. 08/27/23   [name], Tenna Child, FNP  cloNIDine (CATAPRES) 0.3 MG tablet Take 1 tablet (0.3 mg total) by mouth 3 (three) times daily. 08/27/23   Del Nigel Berthold, FNP  Doxercalciferol (HECTOROL IV) Doxercalciferol (Hectorol) 02/09/24 02/07/25  [provider]  doxycycline (VIBRA-TABS) 100 MG tablet Take 100 mg by mouth 2 (two) times daily. Patient not taking: Reported on 03/03/2024 02/23/24   [provider]  hydrALAZINE (APRESOLINE) 100 MG tablet Take 1 tablet (100 mg total) by mouth 3 (three) times daily. Patient not taking: Reported on 03/03/2024 08/27/23   Del Nigel Berthold, FNP  losartan (COZAAR) 50 MG tablet Take 50 mg by mouth at bedtime. Patient not taking: Reported on 03/03/2024 01/27/23   [provider]  methocarbamol (ROBAXIN) 500 MG tablet Take 1 tablet (500 mg total) by mouth every 8 (eight) hours as needed for muscle spasms. 02/23/24   Rolly Salter, MD  Methoxy PEG-Epoetin Beta (MIRCERA IJ)  01/12/24 01/10/25  [provider]  ondansetron (ZOFRAN) 4 MG tablet Take 1 tablet (4 mg total) by mouth every 8 (eight) hours as needed for nausea or vomiting. 02/23/24   Rolly Salter, MD  pantoprazole (PROTONIX) 40 MG tablet Take 1 tablet (40 mg total) by mouth daily before breakfast. 02/23/24 04/23/24  Lynden Oxford  M, MD  propranolol (INDERAL) 10 MG tablet TAKE 1 TABLET BY MOUTH EVERY 12 HOURS AS NEEDED. 02/15/24   Del Nigel Berthold, FNP  sevelamer carbonate (RENVELA) 800 MG tablet Take 800 mg by mouth 3 (three) times daily. 10/08/23   [provider]  topiramate (TOPAMAX) 50 MG tablet TAKE 1 TABLET BY MOUTH EVERY DAY 12/21/23   Del Nigel Berthold, FNP  fenofibrate 160 MG tablet Take 1 tablet (160 mg total) by mouth daily. 12/31/20 01/20/21  Regalado, Jon Billings A, MD      Allergies    Dialyvite 800 [nephro-vite]    Review of  Systems   Review of Systems  Constitutional:  Negative for appetite change and fatigue.  HENT:  Negative for congestion, ear discharge and sinus pressure.   Eyes:  Negative for discharge.  Respiratory:  Negative for cough.   Cardiovascular:  Negative for chest pain.  Gastrointestinal:  Negative for abdominal pain and diarrhea.  Genitourinary:  Negative for frequency and hematuria.  Musculoskeletal:  Negative for back pain.  Skin:  Negative for rash.  Neurological:  Positive for weakness. Negative for seizures and headaches.  Psychiatric/Behavioral:  Negative for hallucinations.     Physical Exam Updated Vital Signs BP (!) 190/92   Pulse 61   Temp 98.1 F (36.7 C) (Oral)   Resp 14   Ht 5\' 7"  (1.702 m)   Wt 66.2 kg   SpO2 99%   BMI 22.87 kg/m  Physical Exam Vitals and nursing note reviewed.  Constitutional:      Appearance: She is well-developed.  HENT:     Head: Normocephalic.     Nose: Nose normal.  Eyes:     General: No scleral icterus.    Conjunctiva/sclera: Conjunctivae normal.  Neck:     Thyroid: No thyromegaly.  Cardiovascular:     Rate and Rhythm: Normal rate and regular rhythm.     Heart sounds: No murmur heard.    No friction rub. No gallop.  Pulmonary:     Breath sounds: No stridor. No wheezing or rales.  Chest:     Chest wall: No tenderness.  Abdominal:     General: There is no distension.     Tenderness: There is no abdominal tenderness. There is no rebound.  Musculoskeletal:        General: Normal range of motion.     Cervical back: Neck supple.  Lymphadenopathy:     Cervical: No cervical adenopathy.  Skin:    Findings: No erythema or rash.  Neurological:     Mental Status: She is alert and oriented to person, place, and time.     Motor: No abnormal muscle tone.     Coordination: Coordination normal.  Psychiatric:        Behavior: Behavior normal.     ED Results / Procedures / Treatments   Labs (all labs ordered are listed, but only  abnormal results are displayed) Labs Reviewed  BASIC METABOLIC PANEL WITH GFR - Abnormal; Notable for the following components:      Result Value   Potassium 5.7 (*)    CO2 20 (*)    Glucose, Bld 114 (*)    BUN 98 (*)    Creatinine, Ser 18.19 (*)    Calcium 8.5 (*)    GFR, Estimated 2 (*)    Anion gap 22 (*)    All other components within normal limits  CBC - Abnormal; Notable for the following components:   RBC 3.15 (*)  Hemoglobin 9.5 (*)    HCT 29.7 (*)    RDW 17.2 (*)    Platelets 87 (*)    All other components within normal limits  TROPONIN I (HIGH SENSITIVITY) - Abnormal; Notable for the following components:   Troponin I (High Sensitivity) 40 (*)    All other components within normal limits  TROPONIN I (HIGH SENSITIVITY) - Abnormal; Notable for the following components:   Troponin I (High Sensitivity) 40 (*)    All other components within normal limits  HEPATITIS B SURFACE ANTIGEN  HEPATITIS B SURFACE ANTIBODY, QUANTITATIVE    EKG EKG Interpretation Date/Time:  Wednesday March 02 2024 08:39:23 EDT Ventricular Rate:  57 PR Interval:  178 QRS Duration:  78 QT Interval:  489 QTC Calculation: 477 R Axis:   63  Text Interpretation: Sinus rhythm Probable left atrial enlargement Probable anterior infarct, old Nonspecific T abnormalities, lateral leads Minimal ST elevation, lateral leads since last tracing no significant change Confirmed by Rolan Bucco [phone]) on 03/03/2024 9:00:22 AM  Radiology No results found.  Procedures Procedures    Medications Ordered in ED Medications  ondansetron (ZOFRAN-ODT) disintegrating tablet 4 mg (4 mg Oral Given 03/02/24 0905)  oxyCODONE-acetaminophen (PERCOCET/ROXICET) 5-325 MG per tablet 1 tablet (1 tablet Oral Given 03/02/24 0905)  ondansetron (ZOFRAN) injection 4 mg (4 mg Intravenous Given 03/02/24 1425)  traMADol (ULTRAM) tablet 50 mg (50 mg Oral Given 03/02/24 1501)    ED Course/ Medical Decision Making/ A&P                                  Medical Decision Making Amount and/or Complexity of Data Reviewed Labs: ordered. Radiology: ordered.  Risk Prescription drug management.   Pt with renal failure and has missed dialysis for one week.  Pt got dialysis today and was discharged home       Final Clinical Impression(s) / ED Diagnoses Final diagnoses:  Acute renal failure with tubular necrosis [facility])    Rx / DC Orders ED Discharge Orders     None         Bethann Berkshire, MD 03/09/24 [id number]

## 2024-03-10 ENCOUNTER — Ambulatory Visit: Admitting: Family Medicine

## 2024-03-14 DIAGNOSIS — N186 End stage renal disease: Secondary | ICD-10-CM | POA: Diagnosis not present

## 2024-03-14 DIAGNOSIS — Z992 Dependence on renal dialysis: Secondary | ICD-10-CM | POA: Diagnosis not present

## 2024-03-16 DIAGNOSIS — Z992 Dependence on renal dialysis: Secondary | ICD-10-CM | POA: Diagnosis not present

## 2024-03-16 DIAGNOSIS — N186 End stage renal disease: Secondary | ICD-10-CM | POA: Diagnosis not present

## 2024-03-18 DIAGNOSIS — Z992 Dependence on renal dialysis: Secondary | ICD-10-CM | POA: Diagnosis not present

## 2024-03-18 DIAGNOSIS — N186 End stage renal disease: Secondary | ICD-10-CM | POA: Diagnosis not present

## 2024-03-23 DIAGNOSIS — N186 End stage renal disease: Secondary | ICD-10-CM | POA: Diagnosis not present

## 2024-03-23 DIAGNOSIS — Z992 Dependence on renal dialysis: Secondary | ICD-10-CM | POA: Diagnosis not present

## 2024-03-25 DIAGNOSIS — N186 End stage renal disease: Secondary | ICD-10-CM | POA: Diagnosis not present

## 2024-03-25 DIAGNOSIS — Z992 Dependence on renal dialysis: Secondary | ICD-10-CM | POA: Diagnosis not present

## 2024-03-30 DIAGNOSIS — N186 End stage renal disease: Secondary | ICD-10-CM | POA: Diagnosis not present

## 2024-03-30 DIAGNOSIS — Z992 Dependence on renal dialysis: Secondary | ICD-10-CM | POA: Diagnosis not present

## 2024-04-01 DIAGNOSIS — Z992 Dependence on renal dialysis: Secondary | ICD-10-CM | POA: Diagnosis not present

## 2024-04-01 DIAGNOSIS — N186 End stage renal disease: Secondary | ICD-10-CM | POA: Diagnosis not present

## 2024-04-04 DIAGNOSIS — N186 End stage renal disease: Secondary | ICD-10-CM | POA: Diagnosis not present

## 2024-04-04 DIAGNOSIS — Z992 Dependence on renal dialysis: Secondary | ICD-10-CM | POA: Diagnosis not present

## 2024-04-06 DIAGNOSIS — Z992 Dependence on renal dialysis: Secondary | ICD-10-CM | POA: Diagnosis not present

## 2024-04-06 DIAGNOSIS — N186 End stage renal disease: Secondary | ICD-10-CM | POA: Diagnosis not present

## 2024-04-07 ENCOUNTER — Other Ambulatory Visit: Payer: Self-pay | Admitting: *Deleted

## 2024-04-07 ENCOUNTER — Telehealth: Payer: Self-pay | Admitting: *Deleted

## 2024-04-07 ENCOUNTER — Encounter: Payer: Self-pay | Admitting: *Deleted

## 2024-04-07 ENCOUNTER — Encounter: Payer: Self-pay | Admitting: Gastroenterology

## 2024-04-07 ENCOUNTER — Ambulatory Visit (INDEPENDENT_AMBULATORY_CARE_PROVIDER_SITE_OTHER): Admitting: Gastroenterology

## 2024-04-07 VITALS — BP 158/90 | HR 54 | Temp 98.9°F | Ht 67.0 in | Wt 140.0 lb

## 2024-04-07 DIAGNOSIS — R188 Other ascites: Secondary | ICD-10-CM | POA: Diagnosis not present

## 2024-04-07 DIAGNOSIS — R197 Diarrhea, unspecified: Secondary | ICD-10-CM

## 2024-04-07 DIAGNOSIS — R160 Hepatomegaly, not elsewhere classified: Secondary | ICD-10-CM

## 2024-04-07 DIAGNOSIS — I509 Heart failure, unspecified: Secondary | ICD-10-CM

## 2024-04-07 DIAGNOSIS — K862 Cyst of pancreas: Secondary | ICD-10-CM | POA: Diagnosis not present

## 2024-04-07 DIAGNOSIS — R7989 Other specified abnormal findings of blood chemistry: Secondary | ICD-10-CM

## 2024-04-07 DIAGNOSIS — K219 Gastro-esophageal reflux disease without esophagitis: Secondary | ICD-10-CM | POA: Diagnosis not present

## 2024-04-07 DIAGNOSIS — R1310 Dysphagia, unspecified: Secondary | ICD-10-CM | POA: Diagnosis not present

## 2024-04-07 DIAGNOSIS — D696 Thrombocytopenia, unspecified: Secondary | ICD-10-CM

## 2024-04-07 DIAGNOSIS — R748 Abnormal levels of other serum enzymes: Secondary | ICD-10-CM | POA: Diagnosis not present

## 2024-04-07 NOTE — Progress Notes (Addendum)
 Referring Provider: Del Orbe Polanco, Ilian* Primary Care Physician:  Rosanna Comment, FNP Primary GI Physician: Dr. Sammi Crick  Chief Complaint  Patient presents with   Ascites    Last para was in March.    HPI:   Cathy [name] is a 43 y.o. female with a history of bipolar disorder, depression, end-stage renal disease on dialysis, lupus, pulmonary embolism, heart failure with reduced EF, GERD, presenting today at the request of Hyler, Bethene Brought, NP for ascites.    Ascites:  Reports increasing abdominal swelling started in September.  Makes it difficult to sleep and causes SOB at times.   Sometimes has swelling in her legs and feet.   Slight improvement with dialysis, but not really.  Dialysis 3 times a week, M, W, F.   Hasn't been following with cardiology. Not sure if she has ever seen anyone outpatient.    Still getting intermittent IV iron  with dialysis.   No mental status changes/confusion.   Unclear if she has cirrhosis, but if so:  MELD 3.0: 21 at 02/01/2024 12:44 PM MELD-Na: 22 at 02/01/2024 12:44 PM Calculated from: Serum Creatinine: 13.68 mg/dL (Using max of 3 mg/dL) at 4/0/9811 91:47 PM Serum Sodium: 139 mmol/L (Using max of 137 mmol/L) at 02/01/2024 12:44 PM Total Bilirubin: 0.8 mg/dL (Using min of 1 mg/dL) at 07/02/9561 13:08 PM Serum Albumin : 3 g/dL at 05/05/7845 96:29 PM INR(ratio): 1.2 at 02/01/2024 12:44 PM Age at listing (hypothetical): 42 years Sex: Female at 02/01/2024 12:44 PM     GERD:  Intermittent heartburn 3-4 times a week, but if she takes pantoprazole , symptoms are well-controlled.  Unfortunately, she does forget to take it at times. Has a lot of nausea. Thinks this is coming from ascites.   Dysphagia:  Intermittent, primarily in the morning.  Occurs once or twice a week or less.  Feels like her throat is having a cramp.   Constipation:  Had been having trouble with constipation, but now having intermittent diarrhea about 3 days a week.   Gets a lot of cramping prior to having a diarrhea spell.  States this has been going on more so since ascites has been an issue.  Notices more of an issue with eating dairy products.  Some issues with beef as well.  Does not think she is ever had a tick bite.  No BRBPR or melena.  No Fhx of colon cancer.   No prior EGD or colonoscopy.     Recent imaging: Chest x-ray 03/02/2024: Cardiomegaly, interstitial edema, small volume pleural effusion.  CT A/P without contrast 02/18/2024: Moderate severity abdominal pelvic ascites, moderate volume severity cardiomegaly with very small pericardial effusion, stable mild to moderate severity anterolateral right basilar scarring, atelectasis, and/or infiltrate, large stool burden.  Ultrasound paracentesis 02/28/2024 removing 350 cc.  Negative for SBP.  CT angio chest abdomen pelvis 8//24: Enlargement of pulmonary outflow tract/main pulmonary artery suggesting pulmonary arterial hypertension.  Collapse/consolidation of both lower lungs with small right and moderate left pleural effusions.  Markedly heterogenous appearance of liver parenchyma on arterial phase imaging could reflect changes of fatty deposition or vascular congestion or both.  Stable mild left para-aortic and left common iliac lymphadenopathy.  Small volume free fluid in the pelvis.  Mesenteric and body wall edema.  Stable 8 mm hypoattenuating lesion in the pancreatic head previously characterized as 7 mm unilocular cyst on MRCP in September 2023.  Echo TEE 02/03/2022: LVEF 35-40% with LV global hypokinesis.  Mitral valve regurgitation.  Mild aortic valve regurgitation.  MR abdomen MRCP without contrast 08/11/2022: No focal hepatic lesion.  Stated appearance of the liver on CT may reflect congestive hepatopathy due to cardiac dysfunction.  Iron  deposition in the liver and spleen.  7 mm unilocular cyst in the pancreatic head, likely benign with recommendations for follow-up MRI in 2 years.  Past Medical  History:  Diagnosis Date   Anasarca associated with disorder of kidney 06/08/2021   Anxiety    Asthma    Bipolar depression (HCC)    Depression    Dyspnea    ESRD (end stage renal disease) on dialysis (HCC) 10/2020   GERD (gastroesophageal reflux disease)    Gonorrhea    Lupus    Pelvic inflammatory disease (PID)    Pleural effusion 06/08/2021   Pulmonary embolism (HCC) 01/31/2022   Renal hypertension    Trichomonas infection     Past Surgical History:  Procedure Laterality Date   A/V FISTULAGRAM N/A 02/22/2024   Procedure: A/V Fistulagram;  Surgeon: Adine Hoof, MD;  Location: [facility] INVASIVE CV LAB;  Service: Cardiovascular;  Laterality: N/A;   AV FISTULA PLACEMENT Left 06/10/2022   Procedure: LEFT ARM ARTERIOVENOUS (AV) FISTULA CREATION;  Surgeon: Mayo Speck, MD;  Location: [facility] ORS;  Service: Vascular;  Laterality: Left;   BASCILIC VEIN TRANSPOSITION Left 03/24/2023   Procedure: LEFT ARM SECOND STAGE BASILIC VEIN TRANSPOSITION;  Surgeon: Mayo Speck, MD;  Location: [facility] ORS;  Service: Vascular;  Laterality: Left;   CENTRAL LINE INSERTION-TUNNELED N/A 02/22/2024   Procedure: CENTRAL LINE INSERTION-TUNNELED;  Surgeon: Adine Hoof, MD;  Location: [facility] INVASIVE CV LAB;  Service: Cardiovascular;  Laterality: N/A;   IR FLUORO GUIDE CV LINE RIGHT  11/30/2020   IR FLUORO GUIDE CV LINE RIGHT  06/13/2021   IR FLUORO GUIDE CV LINE RIGHT  02/01/2022   IR REMOVAL TUN CV CATH W/O FL  06/11/2021   IR REMOVAL TUN CV CATH W/O FL  01/29/2022   IR US  GUIDE VASC ACCESS RIGHT  11/30/2020   IR US  GUIDE VASC ACCESS RIGHT  06/13/2021   IR US  GUIDE VASC ACCESS RIGHT  02/03/2022   RENAL BIOPSY     TEE WITHOUT CARDIOVERSION N/A 06/13/2021   Procedure: TRANSESOPHAGEAL ECHOCARDIOGRAM (TEE);  Surgeon: Loyde Rule, MD;  Location: [facility] ENDOSCOPY;  Service: Cardiovascular;  Laterality: N/A;   TEE WITHOUT CARDIOVERSION N/A 02/03/2022   Procedure: TRANSESOPHAGEAL ECHOCARDIOGRAM (TEE);  Surgeon:  Tobb, Kardie, DO;  Location: [facility] ENDOSCOPY;  Service: Cardiovascular;  Laterality: N/A;   TUBAL LIGATION N/A 02/03/2019   Procedure: POST PARTUM TUBAL LIGATION;  Surgeon: Raynell Caller, MD;  Location: [facility] LD ORS;  Service: Gynecology;  Laterality: N/A;    Current Outpatient Medications  Medication Sig Dispense Refill   acetaminophen  (TYLENOL ) 500 MG tablet Take 1,000 mg by mouth every 6 (six) hours as needed.     albuterol  (VENTOLIN  HFA) 108 (90 Base) MCG/ACT inhaler Inhale 2 puffs into the lungs every 6 (six) hours as needed for wheezing or shortness of breath. 8 g 2   amLODipine  (NORVASC ) 10 MG tablet Take 1 tablet (10 mg total) by mouth in the morning. 30 tablet 2   Blood Pressure Monitoring (OMRON 3 SERIES BP MONITOR) DEVI Use as directed 2 times a day 3 kit 0   busPIRone  (BUSPAR ) 7.5 MG tablet Take 7.5 mg by mouth daily.     camphor-menthol  (SARNA) lotion Apply topically as needed for itching. 222 mL 0   carvedilol  (COREG ) 25 MG  tablet Take 1 tablet (25 mg total) by mouth 2 (two) times daily with a meal. 60 tablet 1   cloNIDine  (CATAPRES ) 0.3 MG tablet Take 1 tablet (0.3 mg total) by mouth 3 (three) times daily. 90 tablet 2   hydrALAZINE  (APRESOLINE ) 100 MG tablet Take 1 tablet (100 mg total) by mouth 3 (three) times daily. 90 tablet 1   hydrOXYzine  (ATARAX ) 25 MG tablet Take 25 mg by mouth every 12 (twelve) hours as needed.     losartan  (COZAAR ) 50 MG tablet Take 50 mg by mouth at bedtime.     pantoprazole  (PROTONIX ) 40 MG tablet Take 1 tablet (40 mg total) by mouth daily before breakfast. 30 tablet 0   propranolol  (INDERAL ) 10 MG tablet TAKE 1 TABLET BY MOUTH EVERY 12 HOURS AS NEEDED. 180 tablet 0   topiramate  (TOPAMAX ) 50 MG tablet TAKE 1 TABLET BY MOUTH EVERY DAY 90 tablet 1   No current facility-administered medications for this visit.    Allergies as of 04/07/2024 - Review Complete 04/07/2024  Allergen Reaction Noted   Dialyvite 800 [nephro-vite] Nausea And Vomiting     Family  History  Problem Relation Age of Onset   Anxiety disorder Mother    Sleep apnea Father    Diabetes Maternal Grandmother    Hypertension Maternal Grandmother    Diabetes Maternal Grandfather    Hypertension Maternal Grandfather    Diabetes Paternal Grandmother    Hypertension Paternal Grandmother    Diabetes Paternal Grandfather    Hypertension Paternal Grandfather     Social History   Socioeconomic History   Marital status: Single    Spouse name: Not on file   Number of children: Not on file   Years of education: Not on file   Highest education level: Not on file  Occupational History   Occupation: unemployed  Tobacco Use   Smoking status: Every Day    Current packs/day: 0.25    Average packs/day: 0.3 packs/day for 25.3 years (6.3 ttl pk-yrs)    Types: Cigarettes    Start date: 2000   Smokeless tobacco: Never   Tobacco comments:    1-2 cigarettes daily   Vaping Use   Vaping status: Never Used  Substance and Sexual Activity   Alcohol use: Not Currently   Drug use: Yes    Frequency: 3.0 times per week    Types: Marijuana   Sexual activity: Not Currently    Birth control/protection: None  Other Topics Concern   Not on file  Social History Narrative   Not on file   Social Drivers of Health   Financial Resource Strain: Low Risk  (02/18/2024)   Overall Financial Resource Strain (CARDIA)    Difficulty of Paying Living Expenses: Not very hard  Food Insecurity: No Food Insecurity (02/20/2024)   Hunger Vital Sign    Worried About Running Out of Food in the Last Year: Never true    Ran Out of Food in the Last Year: Never true  Transportation Needs: No Transportation Needs (02/20/2024)   PRAPARE - Administrator, Civil Service (Medical): No    Lack of Transportation (Non-Medical): No  Physical Activity: Inactive (02/18/2024)   Exercise Vital Sign    Days of Exercise per Week: 1 day    Minutes of Exercise per Session: 0 min  Stress: Stress Concern Present  (02/18/2024)   [employer] of Occupational Health - Occupational Stress Questionnaire    Feeling of Stress : To some extent  Social Connections:  Socially Isolated (02/18/2024)   Social Connection and Isolation Panel [NHANES]    Frequency of Communication with Friends and Family: More than three times a week    Frequency of Social Gatherings with Friends and Family: More than three times a week    Attends Religious Services: Never    Database administrator or Organizations: No    Attends Engineer, structural: Never    Marital Status: Never married    Review of Systems: Gen: Denies fever, chills, cold or flulike symptoms, presyncope, syncope. CV: Denies chest pain, palpitations.  Resp: Denies dyspnea, cough. GI: See HPI Heme: See HPI  Physical Exam: BP (!) 158/90 (BP Location: Right Arm, Patient Position: Sitting, Cuff Size: Normal)   Pulse (!) 54   Temp 98.9 F (37.2 C) (Oral)   Ht 5\' 7"  (1.702 m)   Wt 140 lb (63.5 kg)   LMP 04/07/2020 (Within Months)   Breastfeeding No   BMI 21.93 kg/m  General:   Alert and oriented. No distress noted. Pleasant and cooperative.  Head:  Normocephalic and atraumatic. Eyes:  Conjuctiva clear without scleral icterus. Heart:  S1, S2 present without murmurs appreciated. Lungs:  Clear to auscultation bilaterally. No wheezes, rales, or rhonchi. No distress.  Abdomen:  +BS, soft, non-tender and non-distended. No rebound or guarding. No HSM or masses noted. Msk:  Symmetrical without gross deformities. Normal posture. Extremities:  Without edema. Neurologic:  Alert and  oriented x4 Psych:  Normal mood and affect.    Assessment:  43 year old female with history of bipolar disorder, depression, ESRD on dialysis secondary to lupus, pulmonary embolism Feb 2023, CHF, GERD, presenting today at the request of Hyler, Bethene Brought, NP for ascites.   Ascites:  Etiology may be multifactorial in the setting of ESRD, CHF with low EF.  Unclear if she  has any degree of cirrhosis though no overt cirrhosis has been mentioned on prior imaging.  There is report of possible congestive hepatopathy and hemosiderosis on MRI in September 2023.  Most recent imaging with CT A/P without contrast 02/01/2024 with mild hepatomegaly.   Will pursue paracentesis for therapeutic and diagnostic purposes.  Will calculate SAAG score to help determine etiology of ascites.  Also counseled on importance of low-sodium diet which she is not currently adhering to.  Likely, this will help limit recurrence of ascites.  Due to ESRD, unable to add diuretics and due to CHF, patient is not a candidate for TIPS.  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  Plan for EGD due to dysphagia as per below which also help to evaluate for esophageal varices.  Notably, she is already on carvedilol . Will also obtain RUQ US  with liver doppler as well to ensure she doesn't have a clot in her portal vein.    Elevated ferritin:  May be secondary to receiving IV iron  at dialysis, but will check hemochromatosis DNA in light of concerns for possible underlying liver disease.   Elevated alkaline phosphatase: Chronic.  Most recent labs 02/28/2024 with alk phos 145, AST 20, ALT 13, total bilirubin 1.2.  Likely related to ESRD and CHF, but will check GGT and AMA.    GERD: Responds well to pantoprazole , but not taking this consistently, still having uncontrolled symptoms.   Dysphagia: Intermittent solid dysphagia occurring a couple times a week or less.  Will pursue EGD to evaluate for esophageal web, ring, stricture, EOE. Doubt malignancy.    Intermittent diarrhea: May be related lactose intolerance.  Also consider  alpha gal as she notes issues with beef.  However, could have overflow diarrhea as she was noted to have a large stool burden on CT in March. I have recommended avoiding dairy for now and monitoring.  I will go ahead and check alpha gal panel, screen for  celiac, and check thyroid function for completeness.  If no improvement, consider abdominal x-ray to reevaluate stool burden.    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    CHF with reduced EF: Refer to cardiology.   Plan:  Ultrasound paracentesis.  Max 5 L  25 g of 25% IV albumin  at the start  Body fluid cell count, culture, Gram stain, cytology, albumin  RUQ ultrasound with liver Doppler Hemochromatosis DNA, GGT, AMA, alpha gal panel, IgA, TTG IgA, TSH Proceed with upper endoscopy +/- dilation with propofol  by Dr. Sammi Crick in the near future. The risks, benefits, and alternatives have been discussed with the patient in detail. The patient states understanding and desires to proceed.  ASA 3 Strict low-sodium diet.  No more than 2000 mg/day. Take pantoprazole  40 mg every day 30 minutes before breakfast. Follow a GERD diet:  Avoid fried, fatty, greasy, spicy, citrus foods. Avoid caffeine  and carbonated beverages. Avoid chocolate. Try eating 4-6 small meals a day rather than 3 large meals. Do not eat within 3 hours of laying down. Prop head of bed up on wood or bricks to create a 6 inch incline. Lactose-free diet. Low-fat diet Refer to cardiology Follow-up TBD pending above results.    Shana Daring, PA-C [facility] Gastroenterology 04/07/2024   I have reviewed the note and agree with the APP's assessment as described in this progress note  Samantha Cress, MD Gastroenterology and Hepatology [facility] Gastroenterology

## 2024-04-07 NOTE — Telephone Encounter (Signed)
 [employer] PA: Approved Authorization #[id number]  Tracking #[id number] Dates of service 05/10/2024 - 08/09/2024

## 2024-04-07 NOTE — Patient Instructions (Addendum)
 Will get you scheduled for an ultrasound of your abdomen to reevaluate your liver.  We will get you scheduled for a paracentesis to remove fluid from your abdomen.   As we discussed, you need to follow a strict low-sodium diet.  More than 2000 mg of sodium per 24 hours.  This includes everything that you eat and drink. You should void items out of a can, processed foods, deli meats, frozen preprepared meals. It will be important that you look at the nutrition label on the back of everything after eating to calculate how much sodium you are consuming.   We are also getting you scheduled for an upper endoscopy with possible stretching of your esophagus with Dr. Sammi Crick.   Be sure to take your pantoprazole  40 mg every day to keep acid reflux symptoms under control.  Follow a GERD diet:  Avoid fried, fatty, greasy, spicy, citrus foods. Avoid caffeine  and carbonated beverages. Avoid chocolate. Try eating 4-6 small meals a day rather than 3 large meals. Do not eat within 3 hours of laying down. Prop head of bed up on wood or bricks to create a 6 inch incline.  To help with intermittent diarrhea: Follow a strict lactose-free diet. Avoid fried, fatty, greasy foods.   I am referring you to cardiology due to your history of heart failure.

## 2024-04-11 ENCOUNTER — Encounter (HOSPITAL_COMMUNITY): Payer: Self-pay

## 2024-04-11 ENCOUNTER — Ambulatory Visit (HOSPITAL_COMMUNITY): Admission: RE | Admit: 2024-04-11 | Source: Ambulatory Visit

## 2024-04-11 ENCOUNTER — Ambulatory Visit (HOSPITAL_COMMUNITY)
Admission: RE | Admit: 2024-04-11 | Discharge: 2024-04-11 | Disposition: A | Discharge status: Home / Self Care | Source: Ambulatory Visit | Attending: Gastroenterology

## 2024-04-11 ENCOUNTER — Ambulatory Visit (HOSPITAL_COMMUNITY)
Admission: RE | Admit: 2024-04-11 | Discharge: 2024-04-11 | Disposition: A | Discharge status: Home / Self Care | Source: Ambulatory Visit | Attending: Gastroenterology | Admitting: Gastroenterology

## 2024-04-11 VITALS — BP 174/102 | HR 54 | Temp 98.0°F | Resp 18

## 2024-04-11 DIAGNOSIS — I509 Heart failure, unspecified: Secondary | ICD-10-CM | POA: Insufficient documentation

## 2024-04-11 DIAGNOSIS — K766 Portal hypertension: Secondary | ICD-10-CM | POA: Insufficient documentation

## 2024-04-11 DIAGNOSIS — Z992 Dependence on renal dialysis: Secondary | ICD-10-CM | POA: Diagnosis not present

## 2024-04-11 DIAGNOSIS — K7460 Unspecified cirrhosis of liver: Secondary | ICD-10-CM | POA: Insufficient documentation

## 2024-04-11 DIAGNOSIS — R188 Other ascites: Secondary | ICD-10-CM | POA: Insufficient documentation

## 2024-04-11 DIAGNOSIS — K7689 Other specified diseases of liver: Secondary | ICD-10-CM | POA: Diagnosis not present

## 2024-04-11 DIAGNOSIS — N186 End stage renal disease: Secondary | ICD-10-CM | POA: Insufficient documentation

## 2024-04-11 DIAGNOSIS — K828 Other specified diseases of gallbladder: Secondary | ICD-10-CM | POA: Diagnosis not present

## 2024-04-11 LAB — GRAM STAIN: Gram Stain: NONE SEEN

## 2024-04-11 LAB — BODY FLUID CELL COUNT WITH DIFFERENTIAL
Eos, Fluid: 0 %
Lymphs, Fluid: 47 %
Monocyte-Macrophage-Serous Fluid: 51 % (ref 50–90)
Neutrophil Count, Fluid: 2 % (ref 0–25)
Total Nucleated Cell Count, Fluid: 113 uL (ref 0–1000)

## 2024-04-11 LAB — ALBUMIN, PLEURAL OR PERITONEAL FLUID: Albumin, Fluid: 1.7 g/dL

## 2024-04-11 NOTE — Progress Notes (Signed)
 Patient tolerated right sided Paracentesis procedure well today and 3.8 Liters of amber colored fluid removed with labs collected and sent for processing. Patient verbalized understanding of discharge instructions and had no acute distress noted at completion of paracentesis.

## 2024-04-11 NOTE — Procedures (Signed)
 PROCEDURE SUMMARY:  Successful image-guided paracentesis from the right lower abdomen.  Yielded 3.8 liters of clear, amber fluid.  No immediate complications.  EBL: zero Patient tolerated well.   Specimen was sent for labs.  Please see imaging section of Epic for full dictation.  Marilu Shown PA-C 04/11/2024 1:42 PM

## 2024-04-12 ENCOUNTER — Ambulatory Visit: Payer: Self-pay | Admitting: Gastroenterology

## 2024-04-13 DIAGNOSIS — Z992 Dependence on renal dialysis: Secondary | ICD-10-CM | POA: Diagnosis not present

## 2024-04-13 DIAGNOSIS — N186 End stage renal disease: Secondary | ICD-10-CM | POA: Diagnosis not present

## 2024-04-13 LAB — ACID FAST SMEAR (AFB, MYCOBACTERIA): Acid Fast Smear: NEGATIVE

## 2024-04-14 LAB — CYTOLOGY - NON PAP

## 2024-04-15 DIAGNOSIS — N186 End stage renal disease: Secondary | ICD-10-CM | POA: Diagnosis not present

## 2024-04-15 DIAGNOSIS — Z992 Dependence on renal dialysis: Secondary | ICD-10-CM | POA: Diagnosis not present

## 2024-04-16 LAB — CULTURE, BODY FLUID W GRAM STAIN -BOTTLE: Culture: NO GROWTH

## 2024-04-20 DIAGNOSIS — N186 End stage renal disease: Secondary | ICD-10-CM | POA: Diagnosis not present

## 2024-04-20 DIAGNOSIS — Z992 Dependence on renal dialysis: Secondary | ICD-10-CM | POA: Diagnosis not present

## 2024-04-26 ENCOUNTER — Emergency Department (HOSPITAL_COMMUNITY)
Admission: EM | Admit: 2024-04-26 | Discharge: 2024-04-26 | Discharge status: Against Medical Advice | Attending: Emergency Medicine | Admitting: Emergency Medicine

## 2024-04-26 ENCOUNTER — Other Ambulatory Visit: Payer: Self-pay

## 2024-04-26 ENCOUNTER — Encounter (HOSPITAL_COMMUNITY): Payer: Self-pay

## 2024-04-26 DIAGNOSIS — R0602 Shortness of breath: Secondary | ICD-10-CM | POA: Diagnosis not present

## 2024-04-26 DIAGNOSIS — R197 Diarrhea, unspecified: Secondary | ICD-10-CM | POA: Diagnosis not present

## 2024-04-26 DIAGNOSIS — R110 Nausea: Secondary | ICD-10-CM | POA: Insufficient documentation

## 2024-04-26 DIAGNOSIS — R079 Chest pain, unspecified: Secondary | ICD-10-CM | POA: Diagnosis not present

## 2024-04-26 DIAGNOSIS — Z5321 Procedure and treatment not carried out due to patient leaving prior to being seen by health care provider: Secondary | ICD-10-CM | POA: Diagnosis not present

## 2024-04-26 DIAGNOSIS — R519 Headache, unspecified: Secondary | ICD-10-CM | POA: Insufficient documentation

## 2024-04-26 DIAGNOSIS — R109 Unspecified abdominal pain: Secondary | ICD-10-CM | POA: Insufficient documentation

## 2024-04-26 DIAGNOSIS — Z992 Dependence on renal dialysis: Secondary | ICD-10-CM | POA: Insufficient documentation

## 2024-04-26 NOTE — ED Notes (Signed)
 Pt called 3 times for rooming, no response. Pt LWBS

## 2024-04-26 NOTE — Progress Notes (Addendum)
 Referring Provider: Del Orbe Polanco, Ilian* Primary Care Physician:  Rosanna Comment, FNP Primary GI Physician: Dr. Sammi Crick   Chief Complaint  Patient presents with   Follow-up    Follow up. No problems     HPI:   Cathy [name] is a 43 y.o. female  with history of bipolar disorder, depression, ESRD on dialysis secondary to lupus, pulmonary embolism Feb 2023, CHF, GERD, presenting today for follow-up of of cirrhosis and ascites.   Last seen in the office 04/07/24 for ascites. Suspected this was multifactorial in the setting of ESRD, CHF with low EF. Unclear if cirrhosis. 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 Planned for paracentesis and calculate SAAG, RUQ US  and liver doppler. Also planned to check hemochromatosis DNA due to elevated ferritin, GGT and AMA due to elevated alk phos. Also recommended pantoprazole  daily for GERD, EGD for dysphagia. Also with intermittent diarrhea and recommended dairy avoidance, alpha gal panel, celiac screen, and TSH. Referral placed to cardiology for CHF.   Paracentesis 04/11/2024 yielding 3.8 L. No SBP. SAAG score -1.3 suggesting portal hypertension is not necessarily the cause of ascites. Suspect CHF and ESRD are playing a role here.   RUQ ultrasound 04/11/2024 with cirrhosis, ascites, thickened gallbladder wall measuring 8.4 mm favored to be reactive due to surrounding ascites.  Liver Doppler with patent portal vein.  Labs have not been completed.  Schedule for EGD 05/10/2024.  Today: Has had recurrent abdominal distension. Felt like she was going to pop yesterday, but slight improvement since dialysis. Felt like fluid started to come back the day after para, but got really bad over the weekend.  She did miss 2 dialysis sessions last week.  Mild LE edema, but improved after dialysis yesterday. No confusion or mental status changes.  No ETOH.   AFP: Normal 08/11/2022 Hep A/B vaccination:  Immune to hepatitis B.  Unknown immunity status to hepatitis A. EGD: Never BB: Carvedilol  25 mg twice daily. Diuretics: No.  On dialysis 3 days a week. Paracentesis: Yes.  As per above. History of SBP: No   MELD 3.0: 21 at 02/01/2024 12:44 PM Calculated from: Serum Creatinine: 13.68 mg/dL (Using max of 3 mg/dL) at 01/10/5620 30:86 PM Serum Sodium: 139 mmol/L (Using max of 137 mmol/L) at 02/01/2024 12:44 PM Total Bilirubin: 0.8 mg/dL (Using min of 1 mg/dL) at 04/06/8468 62:95 PM Serum Albumin : 3 g/dL at 01/08/4131 44:01 PM INR(ratio): 1.2 at 02/01/2024 12:44 PM Age at listing (hypothetical): 42 years Sex: Female at 02/01/2024 12:44 PM   Prior labs on file:  History of positive ANA in the setting of lupus. Hep C antibody, hep B core antibody and hep B surface antigen negative with hep B surface antibody positive in October 2023.    Diarrhea:  Resolved. Now with mild constipation.  Has incomplete bowel movements more frequently than normal bowel movements.  No BRBPR or melena.  GERD: Much improved with pantoprazole  40 mg daily.  Past Medical History:  Diagnosis Date   Anasarca associated with disorder of kidney 06/08/2021   Anxiety    Asthma    Bipolar depression (HCC)    Cirrhosis (HCC)    Depression    Dyspnea    ESRD (end stage renal disease) on dialysis (HCC) 10/2020   GERD (gastroesophageal reflux disease)    Gonorrhea    Lupus    Pelvic inflammatory disease (PID)    Pleural effusion 06/08/2021   Pulmonary embolism (HCC) 01/31/2022  Renal hypertension    Trichomonas infection     Past Surgical History:  Procedure Laterality Date   A/V FISTULAGRAM N/A 02/22/2024   Procedure: A/V Fistulagram;  Surgeon: Adine Hoof, MD;  Location: [facility] INVASIVE CV LAB;  Service: Cardiovascular;  Laterality: N/A;   AV FISTULA PLACEMENT Left 06/10/2022   Procedure: LEFT ARM ARTERIOVENOUS (AV) FISTULA CREATION;  Surgeon: Cathy Speck, MD;  Location: [facility] ORS;  Service: Vascular;   Laterality: Left;   BASCILIC VEIN TRANSPOSITION Left 03/24/2023   Procedure: LEFT ARM SECOND STAGE BASILIC VEIN TRANSPOSITION;  Surgeon: Cathy Speck, MD;  Location: [facility] ORS;  Service: Vascular;  Laterality: Left;   CENTRAL LINE INSERTION-TUNNELED N/A 02/22/2024   Procedure: CENTRAL LINE INSERTION-TUNNELED;  Surgeon: Adine Hoof, MD;  Location: [facility] CV LAB;  Service: Cardiovascular;  Laterality: N/A;   IR FLUORO GUIDE CV LINE RIGHT  11/30/2020   IR FLUORO GUIDE CV LINE RIGHT  06/13/2021   IR FLUORO GUIDE CV LINE RIGHT  02/01/2022   IR REMOVAL TUN CV CATH W/O FL  06/11/2021   IR REMOVAL TUN CV CATH W/O FL  01/29/2022   IR US  GUIDE VASC ACCESS RIGHT  11/30/2020   IR US  GUIDE VASC ACCESS RIGHT  06/13/2021   IR US  GUIDE VASC ACCESS RIGHT  02/03/2022   RENAL BIOPSY     TEE WITHOUT CARDIOVERSION N/A 06/13/2021   Procedure: TRANSESOPHAGEAL ECHOCARDIOGRAM (TEE);  Surgeon: Loyde Rule, MD;  Location: [facility] ENDOSCOPY;  Service: Cardiovascular;  Laterality: N/A;   TEE WITHOUT CARDIOVERSION N/A 02/03/2022   Procedure: TRANSESOPHAGEAL ECHOCARDIOGRAM (TEE);  Surgeon: Tobb, Kardie, DO;  Location: [facility] ENDOSCOPY;  Service: Cardiovascular;  Laterality: N/A;   TUBAL LIGATION N/A 02/03/2019   Procedure: POST PARTUM TUBAL LIGATION;  Surgeon: Raynell Caller, MD;  Location: [facility] LD ORS;  Service: Gynecology;  Laterality: N/A;    Current Outpatient Medications  Medication Sig Dispense Refill   acetaminophen  (TYLENOL ) 500 MG tablet Take 1,000 mg by mouth every 6 (six) hours as needed.     albuterol  (VENTOLIN  HFA) 108 (90 Base) MCG/ACT inhaler Inhale 2 puffs into the lungs every 6 (six) hours as needed for wheezing or shortness of breath. 8 g 2   amLODipine  (NORVASC ) 10 MG tablet Take 1 tablet (10 mg total) by mouth in the morning. 30 tablet 2   Blood Pressure Monitoring (OMRON 3 SERIES BP MONITOR) DEVI Use as directed 2 times a day 3 kit 0   busPIRone  (BUSPAR ) 7.5 MG tablet Take 7.5 mg by mouth daily.      camphor-menthol  (SARNA) lotion Apply topically as needed for itching. 222 mL 0   carvedilol  (COREG ) 25 MG tablet Take 1 tablet (25 mg total) by mouth 2 (two) times daily with a meal. 60 tablet 1   cloNIDine  (CATAPRES ) 0.3 MG tablet Take 1 tablet (0.3 mg total) by mouth 3 (three) times daily. 90 tablet 2   hydrALAZINE  (APRESOLINE ) 100 MG tablet Take 1 tablet (100 mg total) by mouth 3 (three) times daily. 90 tablet 1   hydrOXYzine  (ATARAX ) 25 MG tablet Take 25 mg by mouth every 12 (twelve) hours as needed.     losartan  (COZAAR ) 50 MG tablet Take 50 mg by mouth at bedtime.     pantoprazole  (PROTONIX ) 40 MG tablet Take 1 tablet (40 mg total) by mouth daily before breakfast. 30 tablet 0   propranolol  (INDERAL ) 10 MG tablet TAKE 1 TABLET BY MOUTH EVERY 12 HOURS AS NEEDED. 180 tablet 0  topiramate  (TOPAMAX ) 50 MG tablet TAKE 1 TABLET BY MOUTH EVERY DAY 90 tablet 1   No current facility-administered medications for this visit.    Allergies as of 04/28/2024 - Review Complete 04/28/2024  Allergen Reaction Noted   Dialyvite 800 [nephro-vite] Nausea And Vomiting     Family History  Problem Relation Age of Onset   Anxiety disorder Mother    Sleep apnea Father    Diabetes Maternal Grandmother    Hypertension Maternal Grandmother    Diabetes Maternal Grandfather    Hypertension Maternal Grandfather    Diabetes Paternal Grandmother    Hypertension Paternal Grandmother    Diabetes Paternal Grandfather    Hypertension Paternal Grandfather     Social History   Socioeconomic History   Marital status: Single    Spouse name: Not on file   Number of children: Not on file   Years of education: Not on file   Highest education level: Not on file  Occupational History   Occupation: unemployed  Tobacco Use   Smoking status: Every Day    Current packs/day: 0.25    Average packs/day: 0.3 packs/day for 25.4 years (6.4 ttl pk-yrs)    Types: Cigarettes    Start date: 2000   Smokeless tobacco: Never    Tobacco comments:    1-2 cigarettes daily   Vaping Use   Vaping status: Never Used  Substance and Sexual Activity   Alcohol use: Not Currently   Drug use: Yes    Frequency: 3.0 times per week    Types: Marijuana   Sexual activity: Not Currently    Birth control/protection: None  Other Topics Concern   Not on file  Social History Narrative   Not on file   Social Drivers of Health   Financial Resource Strain: Low Risk  (02/18/2024)   Overall Financial Resource Strain (CARDIA)    Difficulty of Paying Living Expenses: Not very hard  Food Insecurity: No Food Insecurity (02/20/2024)   Hunger Vital Sign    Worried About Running Out of Food in the Last Year: Never true    Ran Out of Food in the Last Year: Never true  Transportation Needs: No Transportation Needs (02/20/2024)   PRAPARE - Administrator, Civil Service (Medical): No    Lack of Transportation (Non-Medical): No  Physical Activity: Inactive (02/18/2024)   Exercise Vital Sign    Days of Exercise per Week: 1 day    Minutes of Exercise per Session: 0 min  Stress: Stress Concern Present (02/18/2024)   [employer] of Occupational Health - Occupational Stress Questionnaire    Feeling of Stress : To some extent  Social Connections: Socially Isolated (02/18/2024)   Social Connection and Isolation Panel [NHANES]    Frequency of Communication with Friends and Family: More than three times a week    Frequency of Social Gatherings with Friends and Family: More than three times a week    Attends Religious Services: Never    Database administrator or Organizations: No    Attends Engineer, structural: Never    Marital Status: Never married    Review of Systems: Gen: Denies fever, chills, cold or flulike symptoms, presyncope, syncope. CV: Denies chest pain, palpitations.. Resp: Denies dyspnea, cough. GI: See HPI Heme: See HPI  Physical Exam: BP 139/82 (BP Location: Right Arm, Patient Position: Sitting,  Cuff Size: Normal)   Pulse (!) 56   Temp 97.6 F (36.4 C) (Temporal)   Ht 5\' 7"  (1.702  m)   Wt 158 lb 12.8 oz (72 kg)   LMP 04/07/2020 (Within Months)   BMI 24.87 kg/m  General:   Alert and oriented. No distress noted. Pleasant and cooperative.  Head:  Normocephalic and atraumatic. Eyes:  Conjuctiva clear without scleral icterus. Heart:  S1, S2 present without murmurs appreciated. Lungs:  Clear to auscultation bilaterally. No wheezes, rales, or rhonchi. No distress.  Abdomen:  +BS. Abdomen distended and tight with mild to moderate TTP in left abdomen. No rebound or guarding. No HSM or masses noted. Msk:  Symmetrical without gross deformities. Normal posture. Extremities:  With trace LE edema.  Neurologic:  Alert and  oriented x4 Psych:  Normal mood and affect.    Assessment:  43 y.o. female  with history of bipolar disorder, depression, ESRD on dialysis secondary to lupus, pulmonary embolism Feb 2023, CHF, GERD, presenting today for follow-up of of cirrhosis with ascites.    Cirrhosis with ascites: Newly diagnosed by 04/11/24 the ultrasound.  Associated ascites requiring paracentesis on 04/11/2024 yielding 3.8 L.  No SBP.  Unfortunately, she has had recurrent tense ascites evident on exam today.  Patient reports fluid started to reaccumulate the day after para, but acutely worsened over the weekend after missing 2 dialysis sessions last week.  Unfortunately, due to ESRD, unable to manage ascites with diuretics.  Discussed the importance of not missing dialysis sessions and possibly the need of increasing dialysis to 4 times a week if nephrology feels this is appropriate.  Otherwise, will need to follow a strict low-sodium diet and undergo paracentesis as needed.  Not a TIPS candidate due to CHF.  No history of hepatic encephalopathy.  MELD 3.0 was 21 on 02/01/2024 in the setting of ESRD. She would not be a transplant candidate considering multi organ failure with CHF, ESRD,  cirrhosis.  Regarding etiology of her cirrhosis, may be multifactorial in the setting of lupus, CHF with congestive hepatopathy, possible hemochromatosis in the setting of elevated iron  and hemosiderosis noted on MRI in 2023 though these findings may have been secondary to supplemental IV iron  with dialysis.  No ETOH. Will plan to complete additional serologic workup.  Notably, she has history of positive ANA in the setting of lupus.  HCC screening is up-to-date.  No focal liver lesion noted on 04/11/2024.  She is scheduled for EGD on 05/10/2024 which will screen for esophageal varices.  She is immune to hepatitis B.  Will need to check hep A antibody total.    Elevated ferritin: Likely secondary to receiving IV iron  at dialysis, RA, but will check hemochromatosis DNA.   Elevated alkaline phosphatase: Possibly secondary to congestive hepatopathy, CKD, but in the setting of cirrhosis, will be completing workup to evaluate for PBC as well.  GERD: Much improved after starting pantoprazole  40 mg daily.  Left-sided abdominal pain/constipation: Patient reports this is chronic.  No pain unless palpating her left abdomen.  No significant findings on CT abdomen pelvis without contrast 02/01/2024 aside from large stool burden.  She does report frequent incomplete bowel movements.  Recommended starting MiraLAX  daily.  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  Placed on recall today for MRI in September 2025.   Plan:  ANA, ASMA, immunoglobulins, alpha-1 antitrypsin phenotype, ceruloplasmin, hemochromatosis DNA, AFP, TSH, TTG IgA, hepatitis A antibody total, GGT, CMP, INR Standing orders for paracentesis every 2 weeks as needed.  Max 5 L  25  g of 25% IV albumin  at the start  Cell count, Gram stain, culture Proceed with EGD as scheduled on 05/10/2024 for esophageal variceal screening and previously  reported dysphagia. Continue pantoprazole  40 mg daily. Start MiraLAX  17 g daily. Discussed the importance of not missing dialysis sessions. Encouraged patient to discuss ascites with dialysis and whether or not increasing dialysis to 4 days a week is appropriate. Nutrition:  High-protein diet from a primarily plant-based diet. Avoid red meat.  No raw or undercooked meat, seafood, or shellfish. Low-fat/cholesterol/carbohydrate diet. Limit sodium to no more than 2000 mg/day including everything that you eat and drink. Recommend at least 30 minutes of aerobic and resistance exercise 3 days/week. Placed on recall for MRI/MRCP with and without contrast for September 2025. Follow-up in 3 months.   Shana Daring, PA-C [facility] Gastroenterology 04/28/2024   I have reviewed the note and agree with the APP's assessment as described in this progress note  Samantha Cress, MD Gastroenterology and Hepatology [facility] Gastroenterology

## 2024-04-26 NOTE — ED Notes (Signed)
Called 2x

## 2024-04-26 NOTE — ED Notes (Signed)
Called 1x

## 2024-04-26 NOTE — H&P (View-Only) (Signed)
 Referring Provider: Del Orbe Polanco, Ilian* Primary Care Physician:  Rosanna Comment, FNP Primary GI Physician: Dr. Sammi Crick   Chief Complaint  Patient presents with   Follow-up    Follow up. No problems     HPI:   Cathy [name] is a 43 y.o. female  with history of bipolar disorder, depression, ESRD on dialysis secondary to lupus, pulmonary embolism Feb 2023, CHF, GERD, presenting today for follow-up of of cirrhosis and ascites.   Last seen in the office 04/07/24 for ascites. Suspected this was multifactorial in the setting of ESRD, CHF with low EF. Unclear if cirrhosis. 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 Planned for paracentesis and calculate SAAG, RUQ US  and liver doppler. Also planned to check hemochromatosis DNA due to elevated ferritin, GGT and AMA due to elevated alk phos. Also recommended pantoprazole  daily for GERD, EGD for dysphagia. Also with intermittent diarrhea and recommended dairy avoidance, alpha gal panel, celiac screen, and TSH. Referral placed to cardiology for CHF.   Paracentesis 04/11/2024 yielding 3.8 L. No SBP. SAAG score -1.3 suggesting portal hypertension is not necessarily the cause of ascites. Suspect CHF and ESRD are playing a role here.   RUQ ultrasound 04/11/2024 with cirrhosis, ascites, thickened gallbladder wall measuring 8.4 mm favored to be reactive due to surrounding ascites.  Liver Doppler with patent portal vein.  Labs have not been completed.  Schedule for EGD 05/10/2024.  Today: Has had recurrent abdominal distension. Felt like she was going to pop yesterday, but slight improvement since dialysis. Felt like fluid started to come back the day after para, but got really bad over the weekend.  She did miss 2 dialysis sessions last week.  Mild LE edema, but improved after dialysis yesterday. No confusion or mental status changes.  No ETOH.   AFP: Normal 08/11/2022 Hep A/B vaccination:  Immune to hepatitis B.  Unknown immunity status to hepatitis A. EGD: Never BB: Carvedilol  25 mg twice daily. Diuretics: No.  On dialysis 3 days a week. Paracentesis: Yes.  As per above. History of SBP: No   MELD 3.0: 21 at 02/01/2024 12:44 PM Calculated from: Serum Creatinine: 13.68 mg/dL (Using max of 3 mg/dL) at 12/06/1094 04:54 PM Serum Sodium: 139 mmol/L (Using max of 137 mmol/L) at 02/01/2024 12:44 PM Total Bilirubin: 0.8 mg/dL (Using min of 1 mg/dL) at 0/08/8118 14:78 PM Serum Albumin : 3 g/dL at 01/10/5620 30:86 PM INR(ratio): 1.2 at 02/01/2024 12:44 PM Age at listing (hypothetical): 42 years Sex: Female at 02/01/2024 12:44 PM   Prior labs on file:  History of positive ANA in the setting of lupus. Hep C antibody, hep B core antibody and hep B surface antigen negative with hep B surface antibody positive in October 2023.    Diarrhea:  Resolved. Now with mild constipation.  Has incomplete bowel movements more frequently than normal bowel movements.  No BRBPR or melena.  GERD: Much improved with pantoprazole  40 mg daily.  Past Medical History:  Diagnosis Date   Anasarca associated with disorder of kidney 06/08/2021   Anxiety    Asthma    Bipolar depression (HCC)    Cirrhosis (HCC)    Depression    Dyspnea    ESRD (end stage renal disease) on dialysis (HCC) 10/2020   GERD (gastroesophageal reflux disease)    Gonorrhea    Lupus    Pelvic inflammatory disease (PID)    Pleural effusion 06/08/2021   Pulmonary embolism (HCC) 01/31/2022  Renal hypertension    Trichomonas infection     Past Surgical History:  Procedure Laterality Date   A/V FISTULAGRAM N/A 02/22/2024   Procedure: A/V Fistulagram;  Surgeon: Adine Hoof, MD;  Location: [facility] INVASIVE CV LAB;  Service: Cardiovascular;  Laterality: N/A;   AV FISTULA PLACEMENT Left 06/10/2022   Procedure: LEFT ARM ARTERIOVENOUS (AV) FISTULA CREATION;  Surgeon: Mayo Speck, MD;  Location: [facility] ORS;  Service: Vascular;   Laterality: Left;   BASCILIC VEIN TRANSPOSITION Left 03/24/2023   Procedure: LEFT ARM SECOND STAGE BASILIC VEIN TRANSPOSITION;  Surgeon: Mayo Speck, MD;  Location: [facility] ORS;  Service: Vascular;  Laterality: Left;   CENTRAL LINE INSERTION-TUNNELED N/A 02/22/2024   Procedure: CENTRAL LINE INSERTION-TUNNELED;  Surgeon: Adine Hoof, MD;  Location: [facility] INVASIVE CV LAB;  Service: Cardiovascular;  Laterality: N/A;   IR FLUORO GUIDE CV LINE RIGHT  11/30/2020   IR FLUORO GUIDE CV LINE RIGHT  06/13/2021   IR FLUORO GUIDE CV LINE RIGHT  02/01/2022   IR REMOVAL TUN CV CATH W/O FL  06/11/2021   IR REMOVAL TUN CV CATH W/O FL  01/29/2022   IR US  GUIDE VASC ACCESS RIGHT  11/30/2020   IR US  GUIDE VASC ACCESS RIGHT  06/13/2021   IR US  GUIDE VASC ACCESS RIGHT  02/03/2022   RENAL BIOPSY     TEE WITHOUT CARDIOVERSION N/A 06/13/2021   Procedure: TRANSESOPHAGEAL ECHOCARDIOGRAM (TEE);  Surgeon: Loyde Rule, MD;  Location: [facility] ENDOSCOPY;  Service: Cardiovascular;  Laterality: N/A;   TEE WITHOUT CARDIOVERSION N/A 02/03/2022   Procedure: TRANSESOPHAGEAL ECHOCARDIOGRAM (TEE);  Surgeon: Tobb, Kardie, DO;  Location: [facility] ENDOSCOPY;  Service: Cardiovascular;  Laterality: N/A;   TUBAL LIGATION N/A 02/03/2019   Procedure: POST PARTUM TUBAL LIGATION;  Surgeon: Raynell Caller, MD;  Location: [facility] LD ORS;  Service: Gynecology;  Laterality: N/A;    Current Outpatient Medications  Medication Sig Dispense Refill   acetaminophen  (TYLENOL ) 500 MG tablet Take 1,000 mg by mouth every 6 (six) hours as needed.     albuterol  (VENTOLIN  HFA) 108 (90 Base) MCG/ACT inhaler Inhale 2 puffs into the lungs every 6 (six) hours as needed for wheezing or shortness of breath. 8 g 2   amLODipine  (NORVASC ) 10 MG tablet Take 1 tablet (10 mg total) by mouth in the morning. 30 tablet 2   Blood Pressure Monitoring (OMRON 3 SERIES BP MONITOR) DEVI Use as directed 2 times a day 3 kit 0   busPIRone  (BUSPAR ) 7.5 MG tablet Take 7.5 mg by mouth daily.      camphor-menthol  (SARNA) lotion Apply topically as needed for itching. 222 mL 0   carvedilol  (COREG ) 25 MG tablet Take 1 tablet (25 mg total) by mouth 2 (two) times daily with a meal. 60 tablet 1   cloNIDine  (CATAPRES ) 0.3 MG tablet Take 1 tablet (0.3 mg total) by mouth 3 (three) times daily. 90 tablet 2   hydrALAZINE  (APRESOLINE ) 100 MG tablet Take 1 tablet (100 mg total) by mouth 3 (three) times daily. 90 tablet 1   hydrOXYzine  (ATARAX ) 25 MG tablet Take 25 mg by mouth every 12 (twelve) hours as needed.     losartan  (COZAAR ) 50 MG tablet Take 50 mg by mouth at bedtime.     pantoprazole  (PROTONIX ) 40 MG tablet Take 1 tablet (40 mg total) by mouth daily before breakfast. 30 tablet 0   propranolol  (INDERAL ) 10 MG tablet TAKE 1 TABLET BY MOUTH EVERY 12 HOURS AS NEEDED. 180 tablet 0  topiramate  (TOPAMAX ) 50 MG tablet TAKE 1 TABLET BY MOUTH EVERY DAY 90 tablet 1   No current facility-administered medications for this visit.    Allergies as of 04/28/2024 - Review Complete 04/28/2024  Allergen Reaction Noted   Dialyvite 800 [nephro-vite] Nausea And Vomiting     Family History  Problem Relation Age of Onset   Anxiety disorder Mother    Sleep apnea Father    Diabetes Maternal Grandmother    Hypertension Maternal Grandmother    Diabetes Maternal Grandfather    Hypertension Maternal Grandfather    Diabetes Paternal Grandmother    Hypertension Paternal Grandmother    Diabetes Paternal Grandfather    Hypertension Paternal Grandfather     Social History   Socioeconomic History   Marital status: Single    Spouse name: Not on file   Number of children: Not on file   Years of education: Not on file   Highest education level: Not on file  Occupational History   Occupation: unemployed  Tobacco Use   Smoking status: Every Day    Current packs/day: 0.25    Average packs/day: 0.3 packs/day for 25.4 years (6.4 ttl pk-yrs)    Types: Cigarettes    Start date: 2000   Smokeless tobacco: Never    Tobacco comments:    1-2 cigarettes daily   Vaping Use   Vaping status: Never Used  Substance and Sexual Activity   Alcohol use: Not Currently   Drug use: Yes    Frequency: 3.0 times per week    Types: Marijuana   Sexual activity: Not Currently    Birth control/protection: None  Other Topics Concern   Not on file  Social History Narrative   Not on file   Social Drivers of Health   Financial Resource Strain: Low Risk  (02/18/2024)   Overall Financial Resource Strain (CARDIA)    Difficulty of Paying Living Expenses: Not very hard  Food Insecurity: No Food Insecurity (02/20/2024)   Hunger Vital Sign    Worried About Running Out of Food in the Last Year: Never true    Ran Out of Food in the Last Year: Never true  Transportation Needs: No Transportation Needs (02/20/2024)   PRAPARE - Administrator, Civil Service (Medical): No    Lack of Transportation (Non-Medical): No  Physical Activity: Inactive (02/18/2024)   Exercise Vital Sign    Days of Exercise per Week: 1 day    Minutes of Exercise per Session: 0 min  Stress: Stress Concern Present (02/18/2024)   [employer] of Occupational Health - Occupational Stress Questionnaire    Feeling of Stress : To some extent  Social Connections: Socially Isolated (02/18/2024)   Social Connection and Isolation Panel [NHANES]    Frequency of Communication with Friends and Family: More than three times a week    Frequency of Social Gatherings with Friends and Family: More than three times a week    Attends Religious Services: Never    Database administrator or Organizations: No    Attends Engineer, structural: Never    Marital Status: Never married    Review of Systems: Gen: Denies fever, chills, cold or flulike symptoms, presyncope, syncope. CV: Denies chest pain, palpitations.. Resp: Denies dyspnea, cough. GI: See HPI Heme: See HPI  Physical Exam: BP 139/82 (BP Location: Right Arm, Patient Position: Sitting,  Cuff Size: Normal)   Pulse (!) 56   Temp 97.6 F (36.4 C) (Temporal)   Ht 5\' 7"  (1.702  m)   Wt 158 lb 12.8 oz (72 kg)   LMP 04/07/2020 (Within Months)   BMI 24.87 kg/m  General:   Alert and oriented. No distress noted. Pleasant and cooperative.  Head:  Normocephalic and atraumatic. Eyes:  Conjuctiva clear without scleral icterus. Heart:  S1, S2 present without murmurs appreciated. Lungs:  Clear to auscultation bilaterally. No wheezes, rales, or rhonchi. No distress.  Abdomen:  +BS. Abdomen distended and tight with mild to moderate TTP in left abdomen. No rebound or guarding. No HSM or masses noted. Msk:  Symmetrical without gross deformities. Normal posture. Extremities:  With trace LE edema.  Neurologic:  Alert and  oriented x4 Psych:  Normal mood and affect.    Assessment:  43 y.o. female  with history of bipolar disorder, depression, ESRD on dialysis secondary to lupus, pulmonary embolism Feb 2023, CHF, GERD, presenting today for follow-up of of cirrhosis with ascites.    Cirrhosis with ascites: Newly diagnosed by 04/11/24 the ultrasound.  Associated ascites requiring paracentesis on 04/11/2024 yielding 3.8 L.  No SBP.  Unfortunately, she has had recurrent tense ascites evident on exam today.  Patient reports fluid started to reaccumulate the day after para, but acutely worsened over the weekend after missing 2 dialysis sessions last week.  Unfortunately, due to ESRD, unable to manage ascites with diuretics.  Discussed the importance of not missing dialysis sessions and possibly the need of increasing dialysis to 4 times a week if nephrology feels this is appropriate.  Otherwise, will need to follow a strict low-sodium diet and undergo paracentesis as needed.  Not a TIPS candidate due to CHF.  No history of hepatic encephalopathy.  MELD 3.0 was 21 on 02/01/2024 in the setting of ESRD. She would not be a transplant candidate considering multi organ failure with CHF, ESRD,  cirrhosis.  Regarding etiology of her cirrhosis, may be multifactorial in the setting of lupus, CHF with congestive hepatopathy, possible hemochromatosis in the setting of elevated iron  and hemosiderosis noted on MRI in 2023 though these findings may have been secondary to supplemental IV iron  with dialysis.  No ETOH. Will plan to complete additional serologic workup.  Notably, she has history of positive ANA in the setting of lupus.  HCC screening is up-to-date.  No focal liver lesion noted on 04/11/2024.  She is scheduled for EGD on 05/10/2024 which will screen for esophageal varices.  She is immune to hepatitis B.  Will need to check hep A antibody total.    Elevated ferritin: Likely secondary to receiving IV iron  at dialysis, RA, but will check hemochromatosis DNA.   Elevated alkaline phosphatase: Possibly secondary to congestive hepatopathy, CKD, but in the setting of cirrhosis, will be completing workup to evaluate for PBC as well.  GERD: Much improved after starting pantoprazole  40 mg daily.  Left-sided abdominal pain/constipation: Patient reports this is chronic.  No pain unless palpating her left abdomen.  No significant findings on CT abdomen pelvis without contrast 02/01/2024 aside from large stool burden.  She does report frequent incomplete bowel movements.  Recommended starting MiraLAX  daily.  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  Placed on recall today for MRI in September 2025.   Plan:  ANA, ASMA, immunoglobulins, alpha-1 antitrypsin phenotype, ceruloplasmin, hemochromatosis DNA, AFP, TSH, TTG IgA, hepatitis A antibody total, GGT, CMP, INR Standing orders for paracentesis every 2 weeks as needed.  Max 5 L  25  g of 25% IV albumin  at the start  Cell count, Gram stain, culture Proceed with EGD as scheduled on 05/10/2024 for esophageal variceal screening and previously  reported dysphagia. Continue pantoprazole  40 mg daily. Start MiraLAX  17 g daily. Discussed the importance of not missing dialysis sessions. Encouraged patient to discuss ascites with dialysis and whether or not increasing dialysis to 4 days a week is appropriate. Nutrition:  High-protein diet from a primarily plant-based diet. Avoid red meat.  No raw or undercooked meat, seafood, or shellfish. Low-fat/cholesterol/carbohydrate diet. Limit sodium to no more than 2000 mg/day including everything that you eat and drink. Recommend at least 30 minutes of aerobic and resistance exercise 3 days/week. Placed on recall for MRI/MRCP with and without contrast for September 2025. Follow-up in 3 months.   Shana Daring, PA-C [facility]enter[facility] 04/28/2024

## 2024-04-26 NOTE — ED Notes (Signed)
 Called 3x.

## 2024-04-26 NOTE — ED Triage Notes (Addendum)
 Pt states she needs dialysis, complains of SOB, Nausea, Headaches, Diarrhea, stomach pain and chest pain. Last treatment on Wednesday of last week (MWF) Stomach is rounded.

## 2024-04-27 ENCOUNTER — Ambulatory Visit: Payer: Medicare HMO | Admitting: Family Medicine

## 2024-04-27 DIAGNOSIS — N186 End stage renal disease: Secondary | ICD-10-CM | POA: Diagnosis not present

## 2024-04-27 DIAGNOSIS — Z992 Dependence on renal dialysis: Secondary | ICD-10-CM | POA: Diagnosis not present

## 2024-04-28 ENCOUNTER — Encounter: Payer: Self-pay | Admitting: Gastroenterology

## 2024-04-28 ENCOUNTER — Other Ambulatory Visit: Payer: Self-pay | Admitting: *Deleted

## 2024-04-28 ENCOUNTER — Ambulatory Visit (INDEPENDENT_AMBULATORY_CARE_PROVIDER_SITE_OTHER): Admitting: Gastroenterology

## 2024-04-28 VITALS — BP 139/82 | HR 56 | Temp 97.6°F | Ht 67.0 in | Wt 158.8 lb

## 2024-04-28 DIAGNOSIS — K7460 Unspecified cirrhosis of liver: Secondary | ICD-10-CM | POA: Diagnosis not present

## 2024-04-28 DIAGNOSIS — R188 Other ascites: Secondary | ICD-10-CM

## 2024-04-28 DIAGNOSIS — K5900 Constipation, unspecified: Secondary | ICD-10-CM | POA: Diagnosis not present

## 2024-04-28 DIAGNOSIS — R7989 Other specified abnormal findings of blood chemistry: Secondary | ICD-10-CM | POA: Diagnosis not present

## 2024-04-28 DIAGNOSIS — R109 Unspecified abdominal pain: Secondary | ICD-10-CM | POA: Diagnosis not present

## 2024-04-28 DIAGNOSIS — R748 Abnormal levels of other serum enzymes: Secondary | ICD-10-CM

## 2024-04-28 DIAGNOSIS — K7469 Other cirrhosis of liver: Secondary | ICD-10-CM

## 2024-04-28 DIAGNOSIS — K862 Cyst of pancreas: Secondary | ICD-10-CM

## 2024-04-28 DIAGNOSIS — R197 Diarrhea, unspecified: Secondary | ICD-10-CM | POA: Diagnosis not present

## 2024-04-28 DIAGNOSIS — K219 Gastro-esophageal reflux disease without esophagitis: Secondary | ICD-10-CM | POA: Diagnosis not present

## 2024-04-28 NOTE — Patient Instructions (Addendum)
 Please have labs completed at [facility].   Keep you upcoming appointment for EGD on 6/10 as scheduled.   I am putting in standing orders for paracentesis every 2 weeks as needed to remove the fluid from your abdomen.   It is very important that you do not miss any dialysis sessions as this will certainly worsen the fluid accumulation in her abdomen.  I would encourage you to discuss with dialysis if they would feel it is appropriate for you to increase to 4 days a week to help limit fluid retention.  To help with your bowel movements, start MiraLAX  1 capful (17 g) daily in 8 ounces of water  or other noncarbonated beverage of your choice.  Nutrition Recommendations:  High-protein diet from a primarily plant-based diet. Avoid red meat.  No raw or undercooked meat, seafood, or shellfish. Low-fat/cholesterol/carbohydrate diet. Limit sodium to no more than 2000 mg/day including everything that you eat and drink. This is very important as sodium causes you to retain more fluid.  Recommend at least 30 minutes of aerobic and resistance exercise 3 days/week.  I will plan to see you back in 3 months or sooner if needed.   Shana Daring, PA-C [facility] Gastroenterology

## 2024-04-29 DIAGNOSIS — N186 End stage renal disease: Secondary | ICD-10-CM | POA: Diagnosis not present

## 2024-04-29 DIAGNOSIS — Z992 Dependence on renal dialysis: Secondary | ICD-10-CM | POA: Diagnosis not present

## 2024-04-30 DIAGNOSIS — N186 End stage renal disease: Secondary | ICD-10-CM | POA: Diagnosis not present

## 2024-04-30 DIAGNOSIS — Z992 Dependence on renal dialysis: Secondary | ICD-10-CM | POA: Diagnosis not present

## 2024-05-02 ENCOUNTER — Inpatient Hospital Stay: Attending: Oncology | Admitting: Oncology

## 2024-05-02 DIAGNOSIS — Z992 Dependence on renal dialysis: Secondary | ICD-10-CM | POA: Diagnosis not present

## 2024-05-02 DIAGNOSIS — N186 End stage renal disease: Secondary | ICD-10-CM | POA: Diagnosis not present

## 2024-05-04 DIAGNOSIS — N186 End stage renal disease: Secondary | ICD-10-CM | POA: Diagnosis not present

## 2024-05-04 DIAGNOSIS — Z992 Dependence on renal dialysis: Secondary | ICD-10-CM | POA: Diagnosis not present

## 2024-05-05 ENCOUNTER — Encounter (HOSPITAL_COMMUNITY)
Admission: RE | Admit: 2024-05-05 | Discharge: 2024-05-05 | Disposition: A | Discharge status: Home / Self Care | Source: Ambulatory Visit | Attending: Gastroenterology | Admitting: Gastroenterology

## 2024-05-05 DIAGNOSIS — H5203 Hypermetropia, bilateral: Secondary | ICD-10-CM | POA: Diagnosis not present

## 2024-05-05 DIAGNOSIS — Z01818 Encounter for other preprocedural examination: Secondary | ICD-10-CM

## 2024-05-09 DIAGNOSIS — Z992 Dependence on renal dialysis: Secondary | ICD-10-CM | POA: Diagnosis not present

## 2024-05-09 DIAGNOSIS — N186 End stage renal disease: Secondary | ICD-10-CM | POA: Diagnosis not present

## 2024-05-09 LAB — MITOCHONDRIAL ANTIBODIES: Mitochondrial M2 Ab, IgG: <=20 U (ref <=20.0)

## 2024-05-09 LAB — IGA: Immunoglobulin A: 179 mg/dL (ref 47–310)

## 2024-05-09 LAB — ALPHA-GAL PANEL
Allergen, Mutton, f88: <0.1 kU/L
Allergen, Pork, f26: <0.1 kU/L
Beef: <0.1 kU/L
CLASS: 0
CLASS: 0
Class: 0
GALACTOSE-ALPHA-1,3-GALACTOSE IGE*: <0.1 kU/L (ref <0.10)

## 2024-05-09 LAB — TISSUE TRANSGLUTAMINASE, IGA: (tTG) Ab, IgA: <1 U/mL

## 2024-05-09 LAB — GAMMA GT: GGT: 166 U/L — ABNORMAL HIGH (ref 3–55)

## 2024-05-09 LAB — INTERPRETATION:

## 2024-05-09 LAB — HEMOCHROMATOSIS DNA-PCR(C282Y,H63D)

## 2024-05-09 LAB — TSH: TSH: 2.42 m[IU]/L

## 2024-05-10 ENCOUNTER — Ambulatory Visit (HOSPITAL_COMMUNITY)
Admission: RE | Admit: 2024-05-10 | Discharge: 2024-05-10 | Disposition: A | Discharge status: Home / Self Care | Attending: Gastroenterology | Admitting: Gastroenterology

## 2024-05-10 ENCOUNTER — Ambulatory Visit (HOSPITAL_COMMUNITY): Payer: Self-pay | Admitting: Anesthesiology

## 2024-05-10 ENCOUNTER — Encounter (HOSPITAL_COMMUNITY)
Admission: RE | Disposition: A | Discharge status: Home / Self Care | Payer: Self-pay | Source: Home / Self Care | Attending: Gastroenterology

## 2024-05-10 ENCOUNTER — Encounter (HOSPITAL_COMMUNITY): Payer: Self-pay | Admitting: Gastroenterology

## 2024-05-10 DIAGNOSIS — R0602 Shortness of breath: Secondary | ICD-10-CM | POA: Diagnosis not present

## 2024-05-10 DIAGNOSIS — M329 Systemic lupus erythematosus, unspecified: Secondary | ICD-10-CM | POA: Insufficient documentation

## 2024-05-10 DIAGNOSIS — Z992 Dependence on renal dialysis: Secondary | ICD-10-CM | POA: Diagnosis not present

## 2024-05-10 DIAGNOSIS — I5042 Chronic combined systolic (congestive) and diastolic (congestive) heart failure: Secondary | ICD-10-CM | POA: Diagnosis not present

## 2024-05-10 DIAGNOSIS — I509 Heart failure, unspecified: Secondary | ICD-10-CM | POA: Insufficient documentation

## 2024-05-10 DIAGNOSIS — K219 Gastro-esophageal reflux disease without esophagitis: Secondary | ICD-10-CM | POA: Diagnosis not present

## 2024-05-10 DIAGNOSIS — N186 End stage renal disease: Secondary | ICD-10-CM | POA: Insufficient documentation

## 2024-05-10 DIAGNOSIS — E039 Hypothyroidism, unspecified: Secondary | ICD-10-CM | POA: Insufficient documentation

## 2024-05-10 DIAGNOSIS — R197 Diarrhea, unspecified: Secondary | ICD-10-CM | POA: Insufficient documentation

## 2024-05-10 DIAGNOSIS — R188 Other ascites: Secondary | ICD-10-CM | POA: Diagnosis not present

## 2024-05-10 DIAGNOSIS — R1310 Dysphagia, unspecified: Secondary | ICD-10-CM | POA: Diagnosis not present

## 2024-05-10 DIAGNOSIS — K3189 Other diseases of stomach and duodenum: Secondary | ICD-10-CM

## 2024-05-10 DIAGNOSIS — Z79899 Other long term (current) drug therapy: Secondary | ICD-10-CM | POA: Insufficient documentation

## 2024-05-10 DIAGNOSIS — I132 Hypertensive heart and chronic kidney disease with heart failure and with stage 5 chronic kidney disease, or end stage renal disease: Secondary | ICD-10-CM | POA: Insufficient documentation

## 2024-05-10 DIAGNOSIS — J45909 Unspecified asthma, uncomplicated: Secondary | ICD-10-CM | POA: Diagnosis not present

## 2024-05-10 DIAGNOSIS — F17210 Nicotine dependence, cigarettes, uncomplicated: Secondary | ICD-10-CM | POA: Diagnosis not present

## 2024-05-10 DIAGNOSIS — F419 Anxiety disorder, unspecified: Secondary | ICD-10-CM | POA: Insufficient documentation

## 2024-05-10 DIAGNOSIS — I110 Hypertensive heart disease with heart failure: Secondary | ICD-10-CM | POA: Diagnosis not present

## 2024-05-10 DIAGNOSIS — D631 Anemia in chronic kidney disease: Secondary | ICD-10-CM | POA: Insufficient documentation

## 2024-05-10 DIAGNOSIS — F319 Bipolar disorder, unspecified: Secondary | ICD-10-CM | POA: Diagnosis not present

## 2024-05-10 DIAGNOSIS — I083 Combined rheumatic disorders of mitral, aortic and tricuspid valves: Secondary | ICD-10-CM | POA: Insufficient documentation

## 2024-05-10 DIAGNOSIS — R519 Headache, unspecified: Secondary | ICD-10-CM | POA: Diagnosis not present

## 2024-05-10 DIAGNOSIS — Z86711 Personal history of pulmonary embolism: Secondary | ICD-10-CM | POA: Insufficient documentation

## 2024-05-10 DIAGNOSIS — K7460 Unspecified cirrhosis of liver: Secondary | ICD-10-CM | POA: Insufficient documentation

## 2024-05-10 DIAGNOSIS — Z01818 Encounter for other preprocedural examination: Secondary | ICD-10-CM

## 2024-05-10 HISTORY — PX: ESOPHAGOGASTRODUODENOSCOPY: SHX5428

## 2024-05-10 HISTORY — PX: ESOPHAGEAL DILATION: SHX303

## 2024-05-10 SURGERY — EGD (ESOPHAGOGASTRODUODENOSCOPY)
Anesthesia: General

## 2024-05-10 MED ORDER — LIDOCAINE 2% (20 MG/ML) 5 ML SYRINGE
INTRAMUSCULAR | Status: DC | PRN
  Administered 2024-05-10: 60 mg via INTRAVENOUS

## 2024-05-10 MED ORDER — PROPOFOL 500 MG/50ML IV EMUL
INTRAVENOUS | Status: DC | PRN
Start: 2024-05-10 — End: 2024-05-10
  Administered 2024-05-10: 150 ug/kg/min via INTRAVENOUS

## 2024-05-10 MED ORDER — SODIUM CHLORIDE 0.9 % IV SOLN: INTRAVENOUS | Status: DC | PRN

## 2024-05-10 MED ORDER — LACTATED RINGERS IV SOLN: INTRAVENOUS | Status: DC

## 2024-05-10 MED ORDER — PROPOFOL 10 MG/ML IV BOLUS
INTRAVENOUS | Status: DC | PRN
  Administered 2024-05-10 (×2): 50 mg via INTRAVENOUS

## 2024-05-10 MED ORDER — GLYCOPYRROLATE PF 0.2 MG/ML IJ SOSY
PREFILLED_SYRINGE | INTRAMUSCULAR | Status: DC | PRN
Start: 2024-05-10 — End: 2024-05-10
  Administered 2024-05-10: .2 mg via INTRAVENOUS

## 2024-05-10 NOTE — Interval H&P Note (Signed)
 History and Physical Interval Note:  05/10/2024 7:30 AM  Cathy [name]  has presented today for surgery, with the diagnosis of dysphagia.  The various methods of treatment have been discussed with the patient and family. After consideration of risks, benefits and other options for treatment, the patient has consented to  Procedure(s) with comments: EGD (ESOPHAGOGASTRODUODENOSCOPY) (N/A) - 8:45 am, asa 3  dialysis M,W & F DILATION, ESOPHAGUS (N/A) as a surgical intervention.  The patient's history has been reviewed, patient examined, no change in status, stable for surgery.  I have reviewed the patient's chart and labs.  Questions were answered to the patient's satisfaction.     [name]

## 2024-05-10 NOTE — Anesthesia Postprocedure Evaluation (Signed)
 Anesthesia Post Note  Patient: Cathy [name]  Procedure(s) Performed: EGD (ESOPHAGOGASTRODUODENOSCOPY) DILATION, ESOPHAGUS  Patient location during evaluation: PACU Anesthesia Type: General Level of consciousness: awake and alert Pain management: pain level controlled Vital Signs Assessment: post-procedure vital signs reviewed and stable Respiratory status: spontaneous breathing, nonlabored ventilation, respiratory function stable and patient connected to nasal cannula oxygen  Cardiovascular status: blood pressure returned to baseline and stable Postop Assessment: no apparent nausea or vomiting Anesthetic complications: no   There were no known notable events for this encounter.   Last Vitals:  Vitals:   05/10/24 0713 05/10/24 0855  BP: (!) 148/88 (!) 102/57  Pulse:  (!) 59  Resp: 16 (!) 24  Temp: 36.9 C (!) 36.3 C  SpO2: 100% 94%    Last Pain:  Vitals:   05/10/24 0855  TempSrc: Oral  PainSc:                  Sandy Crumb

## 2024-05-10 NOTE — Transfer of Care (Signed)
 Immediate Anesthesia Transfer of Care Note  Patient: Velvet Gibbs  Procedure(s) Performed: EGD (ESOPHAGOGASTRODUODENOSCOPY) DILATION, ESOPHAGUS  Patient Location: Endoscopy Unit  Anesthesia Type:General  Level of Consciousness: awake, alert , oriented, and patient cooperative  Airway & Oxygen  Therapy: Patient Spontanous Breathing  Post-op Assessment: Report given to RN, Post -op Vital signs reviewed and stable, and Patient moving all extremities X 4  Post vital signs: Reviewed and stable  Last Vitals:  Vitals Value Taken Time  BP 102/57 05/10/24 0855  Temp 36.3 C 05/10/24 0855  Pulse 59 05/10/24 0855  Resp 24 05/10/24 0855  SpO2 94 % 05/10/24 0855    Last Pain:  Vitals:   05/10/24 0855  TempSrc: Oral  PainSc:          Complications: No notable events documented.

## 2024-05-10 NOTE — Op Note (Signed)
 [facility] Patient Name: Cathy [name] Procedure Date: 05/10/2024 7:58 AM MRN: [id number] Date of Birth: 28-May-1981 Attending MD: Samantha Cress , , [id number] CSN: [id number] Age: 43 Admit Type: Outpatient Procedure:                Upper GI endoscopy Indications:              Dysphagia Providers:                Samantha Cress, Pasco Bond, RN, Theola Fitch Referring MD:              Medicines:                Monitored Anesthesia Care Complications:            No immediate complications. Estimated Blood Loss:     Estimated blood loss: none. Procedure:                Pre-Anesthesia Assessment:                           - Prior to the procedure, a History and Physical                            was performed, and patient medications, allergies                            and sensitivities were reviewed. The patient's                            tolerance of previous anesthesia was reviewed.                           - The risks and benefits of the procedure and the                            sedation options and risks were discussed with the                            patient. All questions were answered and informed                            consent was obtained.                           - ASA Grade Assessment: II - A patient with mild                            systemic disease.                           After obtaining informed consent, the endoscope was                            passed under direct vision. Throughout the                            procedure, the patient's blood pressure, pulse,  and                            oxygen  saturations were monitored continuously. The                            [id number]-[id number] ([id number]) scope was introduced through the                            mouth, and advanced to the second part of duodenum.                            The upper GI endoscopy was accomplished without                            difficulty. The patient tolerated the  procedure                            well. Scope In: 8:38:03 AM Scope Out: 8:46:49 AM Total Procedure Duration: 0 hours 8 minutes 46 seconds  Findings:      No endoscopic abnormality was evident in the esophagus to explain the       patient's complaint of dysphagia. It was decided, however, to proceed       with dilation of the entire esophagus. A guidewire was placed and the       scope was withdrawn. Dilation was performed with a Savary dilator with       no resistance at 18 mm. The dilation site was examined following       endoscope reinsertion and showed no change. Biopsies were taken with a       cold forceps for histology.      Patchy mildly erythematous mucosa without bleeding was found in the       gastric antrum.      The examined duodenum was normal. Impression:               - No endoscopic esophageal abnormality to explain                            patient's dysphagia. Esophagus dilated. Dilated.                            Biopsied.                           - Erythematous mucosa in the antrum.                           - Normal examined duodenum. Moderate Sedation:      Per Anesthesia Care Recommendation:           - Discharge patient to home (ambulatory).                           - Resume previous diet.                           - Await pathology results. Procedure Code(s):        ---  Professional ---                           [id number], Esophagogastroduodenoscopy, flexible,                            transoral; with insertion of guide wire followed by                            passage of dilator(s) through esophagus over guide                            wire                           [id number], 59, Esophagogastroduodenoscopy, flexible,                            transoral; with biopsy, single or multiple Diagnosis Code(s):        --- Professional ---                           R13.10, Dysphagia, unspecified                           K31.89, Other diseases of stomach and  duodenum CPT copyright 2022 American Medical Association. All rights reserved. The codes documented in this report are preliminary and upon coder review may  be revised to meet current compliance requirements. Samantha Cress, MD Samantha Cress,  05/10/2024 8:58:01 AM This report has been signed electronically. Number of Addenda: 0

## 2024-05-10 NOTE — Anesthesia Preprocedure Evaluation (Signed)
 Anesthesia Evaluation  Patient identified by MRN, date of birth, ID band Patient awake    Reviewed: Allergy & Precautions, H&P , NPO status , Patient's Chart, lab work & pertinent test results  Airway Mallampati: II  TM Distance: >3 FB Neck ROM: Full    Dental no notable dental hx. (+) Dental Advisory Given, Teeth Intact   Pulmonary shortness of breath and with exertion, asthma , pneumonia, Current Smoker and Patient abstained from smoking., PE   Pulmonary exam normal breath sounds clear to auscultation       Cardiovascular hypertension, Pt. on medications +CHF  Normal cardiovascular exam Rhythm:Regular Rate:Normal + Systolic murmurs 1. Left ventricular ejection fraction, by estimation, is 35 to 40%. The  left ventricle has moderately decreased function. The left ventricle  demonstrates global hypokinesis.   2. Right ventricular systolic function is normal. The right ventricular  size is normal.   3. No left atrial/left atrial appendage thrombus was detected. The LAA  emptying velocity was 70 cm/s.   4. Catheter tip seen in the right atrium, no evidence of vegetation.   5. The mitral valve is normal in structure. Mild mitral valve  regurgitation. No evidence of mitral stenosis.   6. Tricuspid valve regurgitation is moderate.   7. The aortic valve is normal in structure. Aortic valve regurgitation is  mild. No aortic stenosis is present.   8. The inferior vena cava is normal in size with greater than 50%  respiratory variability, suggesting right atrial pressure of 3 mmHg.     Neuro/Psych  Headaches PSYCHIATRIC DISORDERS Anxiety Depression Bipolar Disorder      GI/Hepatic ,GERD  Poorly Controlled and Medicated,,(+) Cirrhosis   ascites      Endo/Other  Hypothyroidism    Renal/GU ESRFRenal disease  negative genitourinary   Musculoskeletal negative musculoskeletal ROS (+)    Abdominal   Peds negative pediatric  ROS (+)  Hematology  (+) Blood dyscrasia, anemia   Anesthesia Other Findings Lupus   Reproductive/Obstetrics negative OB ROS                             Anesthesia Physical Anesthesia Plan  ASA: 4  Anesthesia Plan: General   Post-op Pain Management:    Induction: Intravenous  PONV Risk Score and Plan: Propofol  infusion  Airway Management Planned: Nasal Cannula and Natural Airway  Additional Equipment: None  Intra-op Plan:   Post-operative Plan:   Informed Consent: I have reviewed the patients History and Physical, chart, labs and discussed the procedure including the risks, benefits and alternatives for the proposed anesthesia with the patient or authorized representative who has indicated his/her understanding and acceptance.     Dental advisory given  Plan Discussed with: CRNA  Anesthesia Plan Comments: (Patient refused regional anesthesia for the procedure.)        Anesthesia Quick Evaluation

## 2024-05-11 ENCOUNTER — Ambulatory Visit (INDEPENDENT_AMBULATORY_CARE_PROVIDER_SITE_OTHER): Payer: Self-pay | Admitting: Gastroenterology

## 2024-05-11 ENCOUNTER — Encounter (HOSPITAL_COMMUNITY): Payer: Self-pay | Admitting: Gastroenterology

## 2024-05-11 DIAGNOSIS — Z992 Dependence on renal dialysis: Secondary | ICD-10-CM | POA: Diagnosis not present

## 2024-05-11 DIAGNOSIS — N186 End stage renal disease: Secondary | ICD-10-CM | POA: Diagnosis not present

## 2024-05-11 LAB — SURGICAL PATHOLOGY

## 2024-05-13 DIAGNOSIS — Z992 Dependence on renal dialysis: Secondary | ICD-10-CM | POA: Diagnosis not present

## 2024-05-13 DIAGNOSIS — N186 End stage renal disease: Secondary | ICD-10-CM | POA: Diagnosis not present

## 2024-05-18 DIAGNOSIS — Z992 Dependence on renal dialysis: Secondary | ICD-10-CM | POA: Diagnosis not present

## 2024-05-18 DIAGNOSIS — N186 End stage renal disease: Secondary | ICD-10-CM | POA: Diagnosis not present

## 2024-05-20 DIAGNOSIS — Z992 Dependence on renal dialysis: Secondary | ICD-10-CM | POA: Diagnosis not present

## 2024-05-20 DIAGNOSIS — N186 End stage renal disease: Secondary | ICD-10-CM | POA: Diagnosis not present

## 2024-05-24 DIAGNOSIS — N186 End stage renal disease: Secondary | ICD-10-CM | POA: Diagnosis not present

## 2024-05-24 DIAGNOSIS — Z992 Dependence on renal dialysis: Secondary | ICD-10-CM | POA: Diagnosis not present

## 2024-05-27 DIAGNOSIS — N186 End stage renal disease: Secondary | ICD-10-CM | POA: Diagnosis not present

## 2024-05-27 DIAGNOSIS — Z992 Dependence on renal dialysis: Secondary | ICD-10-CM | POA: Diagnosis not present

## 2024-05-30 DIAGNOSIS — N186 End stage renal disease: Secondary | ICD-10-CM | POA: Diagnosis not present

## 2024-05-30 DIAGNOSIS — Z992 Dependence on renal dialysis: Secondary | ICD-10-CM | POA: Diagnosis not present

## 2024-05-31 ENCOUNTER — Other Ambulatory Visit: Payer: Self-pay

## 2024-05-31 ENCOUNTER — Inpatient Hospital Stay (HOSPITAL_COMMUNITY)
Admission: EM | Admit: 2024-05-31 | Discharge: 2024-06-03 | DRG: 056 | Disposition: A | Discharge status: Home / Self Care | Attending: Internal Medicine | Admitting: Internal Medicine

## 2024-05-31 ENCOUNTER — Emergency Department (HOSPITAL_COMMUNITY)

## 2024-05-31 ENCOUNTER — Inpatient Hospital Stay (HOSPITAL_COMMUNITY)

## 2024-05-31 ENCOUNTER — Encounter (HOSPITAL_COMMUNITY): Payer: Self-pay | Admitting: Internal Medicine

## 2024-05-31 ENCOUNTER — Inpatient Hospital Stay (HOSPITAL_COMMUNITY)
Admit: 2024-05-31 | Discharge: 2024-05-31 | Disposition: A | Discharge status: Home / Self Care | Attending: Internal Medicine

## 2024-05-31 DIAGNOSIS — G9389 Other specified disorders of brain: Secondary | ICD-10-CM | POA: Diagnosis not present

## 2024-05-31 DIAGNOSIS — K219 Gastro-esophageal reflux disease without esophagitis: Secondary | ICD-10-CM | POA: Diagnosis present

## 2024-05-31 DIAGNOSIS — Z833 Family history of diabetes mellitus: Secondary | ICD-10-CM

## 2024-05-31 DIAGNOSIS — R188 Other ascites: Secondary | ICD-10-CM | POA: Diagnosis not present

## 2024-05-31 DIAGNOSIS — N19 Unspecified kidney failure: Secondary | ICD-10-CM | POA: Diagnosis present

## 2024-05-31 DIAGNOSIS — N250 Renal osteodystrophy: Secondary | ICD-10-CM | POA: Diagnosis not present

## 2024-05-31 DIAGNOSIS — I132 Hypertensive heart and chronic kidney disease with heart failure and with stage 5 chronic kidney disease, or end stage renal disease: Secondary | ICD-10-CM | POA: Diagnosis present

## 2024-05-31 DIAGNOSIS — F319 Bipolar disorder, unspecified: Secondary | ICD-10-CM | POA: Diagnosis present

## 2024-05-31 DIAGNOSIS — I6389 Other cerebral infarction: Secondary | ICD-10-CM | POA: Diagnosis not present

## 2024-05-31 DIAGNOSIS — Z8249 Family history of ischemic heart disease and other diseases of the circulatory system: Secondary | ICD-10-CM | POA: Diagnosis not present

## 2024-05-31 DIAGNOSIS — R0989 Other specified symptoms and signs involving the circulatory and respiratory systems: Secondary | ICD-10-CM | POA: Diagnosis not present

## 2024-05-31 DIAGNOSIS — Z992 Dependence on renal dialysis: Secondary | ICD-10-CM | POA: Diagnosis not present

## 2024-05-31 DIAGNOSIS — Z888 Allergy status to other drugs, medicaments and biological substances status: Secondary | ICD-10-CM

## 2024-05-31 DIAGNOSIS — R0689 Other abnormalities of breathing: Secondary | ICD-10-CM | POA: Diagnosis not present

## 2024-05-31 DIAGNOSIS — Z86711 Personal history of pulmonary embolism: Secondary | ICD-10-CM

## 2024-05-31 DIAGNOSIS — Z79899 Other long term (current) drug therapy: Secondary | ICD-10-CM

## 2024-05-31 DIAGNOSIS — I69398 Other sequelae of cerebral infarction: Principal | ICD-10-CM

## 2024-05-31 DIAGNOSIS — M3214 Glomerular disease in systemic lupus erythematosus: Secondary | ICD-10-CM | POA: Diagnosis not present

## 2024-05-31 DIAGNOSIS — H5500 Unspecified nystagmus: Secondary | ICD-10-CM | POA: Diagnosis present

## 2024-05-31 DIAGNOSIS — R4182 Altered mental status, unspecified: Secondary | ICD-10-CM | POA: Diagnosis not present

## 2024-05-31 DIAGNOSIS — E875 Hyperkalemia: Secondary | ICD-10-CM | POA: Diagnosis not present

## 2024-05-31 DIAGNOSIS — R569 Unspecified convulsions: Principal | ICD-10-CM | POA: Diagnosis present

## 2024-05-31 DIAGNOSIS — Z87441 Personal history of nephrotic syndrome: Secondary | ICD-10-CM

## 2024-05-31 DIAGNOSIS — I639 Cerebral infarction, unspecified: Secondary | ICD-10-CM | POA: Diagnosis not present

## 2024-05-31 DIAGNOSIS — R899 Unspecified abnormal finding in specimens from other organs, systems and tissues: Secondary | ICD-10-CM | POA: Diagnosis not present

## 2024-05-31 DIAGNOSIS — I5043 Acute on chronic combined systolic (congestive) and diastolic (congestive) heart failure: Secondary | ICD-10-CM | POA: Diagnosis not present

## 2024-05-31 DIAGNOSIS — N186 End stage renal disease: Secondary | ICD-10-CM

## 2024-05-31 DIAGNOSIS — J45909 Unspecified asthma, uncomplicated: Secondary | ICD-10-CM | POA: Diagnosis present

## 2024-05-31 DIAGNOSIS — D631 Anemia in chronic kidney disease: Secondary | ICD-10-CM | POA: Diagnosis not present

## 2024-05-31 DIAGNOSIS — I120 Hypertensive chronic kidney disease with stage 5 chronic kidney disease or end stage renal disease: Secondary | ICD-10-CM | POA: Diagnosis not present

## 2024-05-31 DIAGNOSIS — N2581 Secondary hyperparathyroidism of renal origin: Secondary | ICD-10-CM | POA: Diagnosis present

## 2024-05-31 DIAGNOSIS — D696 Thrombocytopenia, unspecified: Secondary | ICD-10-CM | POA: Diagnosis present

## 2024-05-31 DIAGNOSIS — G9341 Metabolic encephalopathy: Principal | ICD-10-CM | POA: Diagnosis present

## 2024-05-31 DIAGNOSIS — K7460 Unspecified cirrhosis of liver: Secondary | ICD-10-CM | POA: Diagnosis present

## 2024-05-31 DIAGNOSIS — E7220 Disorder of urea cycle metabolism, unspecified: Secondary | ICD-10-CM | POA: Diagnosis not present

## 2024-05-31 DIAGNOSIS — Z8719 Personal history of other diseases of the digestive system: Secondary | ICD-10-CM

## 2024-05-31 DIAGNOSIS — Z5982 Transportation insecurity: Secondary | ICD-10-CM

## 2024-05-31 DIAGNOSIS — I5042 Chronic combined systolic (congestive) and diastolic (congestive) heart failure: Secondary | ICD-10-CM | POA: Diagnosis not present

## 2024-05-31 DIAGNOSIS — I499 Cardiac arrhythmia, unspecified: Secondary | ICD-10-CM | POA: Diagnosis not present

## 2024-05-31 DIAGNOSIS — F17210 Nicotine dependence, cigarettes, uncomplicated: Secondary | ICD-10-CM | POA: Diagnosis present

## 2024-05-31 DIAGNOSIS — R140 Abdominal distension (gaseous): Secondary | ICD-10-CM | POA: Diagnosis not present

## 2024-05-31 DIAGNOSIS — N289 Disorder of kidney and ureter, unspecified: Secondary | ICD-10-CM | POA: Diagnosis not present

## 2024-05-31 DIAGNOSIS — K7581 Nonalcoholic steatohepatitis (NASH): Secondary | ICD-10-CM | POA: Diagnosis present

## 2024-05-31 DIAGNOSIS — I5022 Chronic systolic (congestive) heart failure: Secondary | ICD-10-CM | POA: Diagnosis present

## 2024-05-31 DIAGNOSIS — Z743 Need for continuous supervision: Secondary | ICD-10-CM | POA: Diagnosis not present

## 2024-05-31 DIAGNOSIS — R9431 Abnormal electrocardiogram [ECG] [EKG]: Secondary | ICD-10-CM | POA: Diagnosis not present

## 2024-05-31 DIAGNOSIS — R609 Edema, unspecified: Secondary | ICD-10-CM | POA: Diagnosis present

## 2024-05-31 DIAGNOSIS — Z91158 Patient's noncompliance with renal dialysis for other reason: Secondary | ICD-10-CM

## 2024-05-31 DIAGNOSIS — R6889 Other general symptoms and signs: Secondary | ICD-10-CM | POA: Diagnosis not present

## 2024-05-31 DIAGNOSIS — Z8673 Personal history of transient ischemic attack (TIA), and cerebral infarction without residual deficits: Secondary | ICD-10-CM

## 2024-05-31 DIAGNOSIS — Z818 Family history of other mental and behavioral disorders: Secondary | ICD-10-CM

## 2024-05-31 DIAGNOSIS — Z9851 Tubal ligation status: Secondary | ICD-10-CM

## 2024-05-31 LAB — COMPREHENSIVE METABOLIC PANEL WITH GFR
ALT: 13 U/L (ref 0–44)
AST: 21 U/L (ref 15–41)
Albumin: 3.1 g/dL — ABNORMAL LOW (ref 3.5–5.0)
Alkaline Phosphatase: 227 U/L — ABNORMAL HIGH (ref 38–126)
Anion gap: 18 — ABNORMAL HIGH (ref 5–15)
BUN: 59 mg/dL — ABNORMAL HIGH (ref 6–20)
CO2: 24 mmol/L (ref 22–32)
Calcium: 7.9 mg/dL — ABNORMAL LOW (ref 8.9–10.3)
Chloride: 98 mmol/L (ref 98–111)
Creatinine, Ser: 11.81 mg/dL — ABNORMAL HIGH (ref 0.44–1.00)
GFR, Estimated: 4 mL/min — ABNORMAL LOW (ref >60)
Glucose, Bld: 119 mg/dL — ABNORMAL HIGH (ref 70–99)
Potassium: 6.1 mmol/L — ABNORMAL HIGH (ref 3.5–5.1)
Sodium: 140 mmol/L (ref 135–145)
Total Bilirubin: 0.7 mg/dL (ref 0.0–1.2)
Total Protein: 6.6 g/dL (ref 6.5–8.1)

## 2024-05-31 LAB — HEPATITIS B SURFACE ANTIGEN: Hepatitis B Surface Ag: NONREACTIVE

## 2024-05-31 LAB — CBC WITH DIFFERENTIAL/PLATELET
Abs Immature Granulocytes: 0.11 10*3/uL — ABNORMAL HIGH (ref 0.00–0.07)
Basophils Absolute: 0 10*3/uL (ref 0.0–0.1)
Basophils Relative: 1 %
Eosinophils Absolute: 0.2 10*3/uL (ref 0.0–0.5)
Eosinophils Relative: 3 %
HCT: 25.2 % — ABNORMAL LOW (ref 36.0–46.0)
Hemoglobin: 8 g/dL — ABNORMAL LOW (ref 12.0–15.0)
Immature Granulocytes: 2 %
Lymphocytes Relative: 6 %
Lymphs Abs: 0.4 10*3/uL — ABNORMAL LOW (ref 0.7–4.0)
MCH: 30.1 pg (ref 26.0–34.0)
MCHC: 31.7 g/dL (ref 30.0–36.0)
MCV: 94.7 fL (ref 80.0–100.0)
Monocytes Absolute: 0.5 10*3/uL (ref 0.1–1.0)
Monocytes Relative: 7 %
Neutro Abs: 5.9 10*3/uL (ref 1.7–7.7)
Neutrophils Relative %: 81 %
Platelets: 142 10*3/uL — ABNORMAL LOW (ref 150–400)
RBC: 2.66 MIL/uL — ABNORMAL LOW (ref 3.87–5.11)
RDW: 16.4 % — ABNORMAL HIGH (ref 11.5–15.5)
WBC: 7.1 10*3/uL (ref 4.0–10.5)
nRBC: 0 % (ref 0.0–0.2)

## 2024-05-31 LAB — T4, FREE: Free T4: 0.84 ng/dL (ref 0.61–1.12)

## 2024-05-31 LAB — MAGNESIUM: Magnesium: 2.1 mg/dL (ref 1.7–2.4)

## 2024-05-31 LAB — FOLATE: Folate: 7.8 ng/mL (ref >5.9)

## 2024-05-31 LAB — VITAMIN B12: Vitamin B-12: 221 pg/mL (ref 180–914)

## 2024-05-31 LAB — PHOSPHORUS: Phosphorus: 8.7 mg/dL — ABNORMAL HIGH (ref 2.5–4.6)

## 2024-05-31 LAB — TSH: TSH: 0.946 u[IU]/mL (ref 0.350–4.500)

## 2024-05-31 LAB — ETHANOL: Alcohol, Ethyl (B): <15 mg/dL (ref <15)

## 2024-05-31 LAB — CORTISOL-AM, BLOOD: Cortisol - AM: 18.5 ug/dL (ref 6.7–22.6)

## 2024-05-31 LAB — AMMONIA: Ammonia: 42 umol/L — ABNORMAL HIGH (ref 9–35)

## 2024-05-31 LAB — PROTIME-INR
INR: 1.1 (ref 0.8–1.2)
Prothrombin Time: 14.9 s (ref 11.4–15.2)

## 2024-05-31 LAB — MRSA NEXT GEN BY PCR, NASAL: MRSA by PCR Next Gen: NOT DETECTED

## 2024-05-31 LAB — CBG MONITORING, ED: Glucose-Capillary: 142 mg/dL — ABNORMAL HIGH (ref 70–99)

## 2024-05-31 MED ORDER — SODIUM ZIRCONIUM CYCLOSILICATE 10 G PO PACK
10.0000 g | PACK | Freq: Once | ORAL | Status: DC
  Filled 2024-05-31: qty 2

## 2024-05-31 MED ORDER — HYDRALAZINE HCL 20 MG/ML IJ SOLN
10.0000 mg | Freq: Four times a day (QID) | INTRAMUSCULAR | Status: DC | PRN
  Administered 2024-05-31 – 2024-06-01 (×3): 10 mg via INTRAVENOUS
  Filled 2024-05-31 (×3): qty 1

## 2024-05-31 MED ORDER — CHLORHEXIDINE GLUCONATE CLOTH 2 % EX PADS
6.0000 | MEDICATED_PAD | Freq: Every day | CUTANEOUS | Status: DC
  Administered 2024-06-01 – 2024-06-03 (×2): 6 via TOPICAL

## 2024-05-31 MED ORDER — LEVETIRACETAM (KEPPRA) 500 MG/5 ML ADULT IV PUSH
1000.0000 mg | Freq: Once | INTRAVENOUS | Status: AC
  Administered 2024-05-31: 1000 mg via INTRAVENOUS
  Filled 2024-05-31: qty 10

## 2024-05-31 MED ORDER — HEPARIN SODIUM (PORCINE) 5000 UNIT/ML IJ SOLN
5000.0000 [IU] | Freq: Three times a day (TID) | INTRAMUSCULAR | Status: DC
  Administered 2024-05-31 – 2024-06-01 (×3): 5000 [IU] via SUBCUTANEOUS
  Filled 2024-05-31 (×7): qty 1

## 2024-05-31 MED ORDER — CHLORHEXIDINE GLUCONATE CLOTH 2 % EX PADS
6.0000 | MEDICATED_PAD | Freq: Every day | CUTANEOUS | Status: DC
  Administered 2024-05-31 – 2024-06-03 (×4): 6 via TOPICAL

## 2024-05-31 MED ORDER — ALBUMIN HUMAN 25 % IV SOLN: 0.0000 g | Freq: Once | INTRAVENOUS | Status: DC | PRN

## 2024-05-31 MED ORDER — CALCIUM GLUCONATE-NACL 1-0.675 GM/50ML-% IV SOLN
1.0000 g | Freq: Once | INTRAVENOUS | Status: AC
  Administered 2024-05-31: 1000 mg via INTRAVENOUS
  Filled 2024-05-31: qty 50

## 2024-05-31 MED ORDER — LORAZEPAM 2 MG/ML IJ SOLN
1.0000 mg | Freq: Once | INTRAMUSCULAR | Status: AC
  Administered 2024-05-31: 1 mg via INTRAVENOUS
  Filled 2024-05-31: qty 1

## 2024-05-31 MED ORDER — PROCHLORPERAZINE EDISYLATE 10 MG/2ML IJ SOLN: 10.0000 mg | Freq: Four times a day (QID) | INTRAMUSCULAR | Status: DC | PRN

## 2024-05-31 MED ORDER — LACTULOSE ENEMA
300.0000 mL | Freq: Once | ORAL | Status: DC
  Filled 2024-05-31: qty 300

## 2024-05-31 NOTE — Plan of Care (Signed)

## 2024-05-31 NOTE — Progress Notes (Signed)
 Post dialysis status: 3.5 L UF achieved. Patient presents with persistent confusion,Unresponsiveness,and involuntary motor activity.  05/31/24 1343  Vitals  Temp 97.6 F (36.4 C)  Temp Source Axillary  BP (!) 184/86  BP Location Right Arm  BP Method Automatic  Patient Position (if appropriate) Lying  Weight 65.5 kg  Type of Weight Post-Dialysis  Oxygen  Therapy  O2 Device Nasal Cannula  O2 Flow Rate (L/min) 2 L/min  During Treatment Monitoring  HD Safety Checks Performed Yes  Intra-Hemodialysis Comments Tx completed  Post Treatment  Dialyzer Clearance Heavily streaked  Hemodialysis Intake (mL) 0 mL  Liters Processed 78  Fluid Removed (mL) 3500 mL  Tolerated HD Treatment Yes  Post-Hemodialysis Comments Pt goal met.  Hemodialysis Catheter Left Internal jugular Double lumen Temporary (Non-Tunneled)  Placement Date/Time: 02/22/24 1227   Placed prior to admission: No  Serial / Lot #: [id number]  Expiration Date: 04/30/28  Time Out: Correct patient;Correct site;Correct procedure  Maximum sterile barrier precautions: Hand hygiene;Cap;Sterile gown;Ster...  Site Condition No complications  Blue Lumen Status Heparin  locked  Red Lumen Status Heparin  locked  Catheter fill solution Heparin  1000 units/ml  Catheter fill volume (Arterial) 1.9 cc  Catheter fill volume (Venous) 1.9  Dressing Type Gauze/Drain sponge;Transparent  Dressing Status Antimicrobial disc/dressing in place;Clean, Dry, Intact  Interventions Dressing changed  Drainage Description None  Dressing Change Due 06/07/24  Post treatment catheter status Capped and Clamped

## 2024-05-31 NOTE — ED Provider Notes (Signed)
 [place] EMERGENCY DEPARTMENT AT [facility]  Provider Note  CSN: [id number] Arrival date & time: 05/31/24 0327  History Chief Complaint  Patient presents with   Altered Mental Status    Cathy [name] is a 43 y.o. female with history of lupus, ESRD on HD MWF and cirrhosis brought to the ED via EMS from home. EMS reports family called for AMS onset a short time prior to arrival. She is unable to give any further history.    Home Medications Prior to Admission medications   Medication Sig Start Date End Date Taking? Authorizing Provider  acetaminophen  (TYLENOL ) 500 MG tablet Take 1,000 mg by mouth every 6 (six) hours as needed.    [provider]  albuterol  (VENTOLIN  HFA) 108 (90 Base) MCG/ACT inhaler Inhale 2 puffs into the lungs every 6 (six) hours as needed for wheezing or shortness of breath. 12/18/23   Del Orbe Polanco, Iliana, FNP  amLODipine  (NORVASC ) 10 MG tablet Take 1 tablet (10 mg total) by mouth in the morning. 08/27/23   Del Wilhelmena Lloyd Sola, FNP  Blood Pressure Monitoring (OMRON 3 SERIES BP MONITOR) DEVI Use as directed 2 times a day 08/27/23   Del Wilhelmena Lloyd Sola, FNP  busPIRone  (BUSPAR ) 7.5 MG tablet Take 7.5 mg by mouth daily. 03/30/24   [provider]  camphor-menthol  VIKKI) lotion Apply topically as needed for itching. 02/23/24   Tobie Yetta HERO, MD  carvedilol  (COREG ) 25 MG tablet Take 1 tablet (25 mg total) by mouth 2 (two) times daily with a meal. 08/27/23   Del Wilhelmena Lloyd, Sola, FNP  cloNIDine  (CATAPRES ) 0.3 MG tablet Take 1 tablet (0.3 mg total) by mouth 3 (three) times daily. 08/27/23   Del Orbe Polanco, Iliana, FNP  hydrALAZINE  (APRESOLINE ) 100 MG tablet Take 1 tablet (100 mg total) by mouth 3 (three) times daily. 08/27/23   Del Orbe Polanco, Iliana, FNP  hydrOXYzine  (ATARAX ) 25 MG tablet Take 25 mg by mouth every 12 (twelve) hours as needed. 03/30/24   [provider]  losartan  (COZAAR ) 50 MG tablet Take 50 mg by  mouth at bedtime. 01/27/23   [provider]  pantoprazole  (PROTONIX ) 40 MG tablet Take 1 tablet (40 mg total) by mouth daily before breakfast. 02/23/24 04/28/24  Tobie Yetta HERO, MD  propranolol  (INDERAL ) 10 MG tablet TAKE 1 TABLET BY MOUTH EVERY 12 HOURS AS NEEDED. 02/15/24   Del Orbe Polanco, Sola, FNP  topiramate  (TOPAMAX ) 50 MG tablet TAKE 1 TABLET BY MOUTH EVERY DAY 12/21/23   Del Orbe Polanco, Iliana, FNP     Allergies    Dialyvite 800 [nephro-vite]   Review of Systems   Review of Systems Please see HPI for pertinent positives and negatives  Physical Exam BP (!) 165/87   Pulse (!) 54   Temp (!) 97.3 F (36.3 C) (Axillary)   Resp (!) 22   LMP 04/07/2020 (Within Months)   SpO2 100%   Physical Exam Vitals and nursing note reviewed.  Constitutional:      Appearance: Normal appearance.  HENT:     Head: Normocephalic and atraumatic.     Nose: Nose normal.     Mouth/Throat:     Mouth: Mucous membranes are dry.   Eyes:     Extraocular Movements: Extraocular movements intact.     Conjunctiva/sclera: Conjunctivae normal.     Pupils: Pupils are equal, round, and reactive to light.    Cardiovascular:     Rate and Rhythm: Bradycardia present.  Pulmonary:     Effort: Pulmonary effort is normal.     Breath sounds: Normal breath sounds.  Abdominal:     General: Abdomen is protuberant.     Tenderness: There is no abdominal tenderness.   Musculoskeletal:        General: No swelling. Normal range of motion.     Cervical back: Neck supple.     Right lower leg: No edema.     Left lower leg: No edema.     Comments: Dialysis graft in LUE with thrill   Skin:    General: Skin is warm and dry.   Neurological:     Mental Status: She is alert.     Comments: Patient unable to speak, follows commands with equal strength in all four extremities, mild ataxia. No facial asymmetry    ED Results / Procedures / Treatments   EKG EKG Interpretation Date/Time:  Tuesday May 31 2024 03:41:00 EDT Ventricular Rate:  53 PR Interval:  174 QRS Duration:  98 QT Interval:  533 QTC Calculation: 501 R Axis:   56  Text Interpretation: Sinus rhythm Left atrial enlargement Borderline low voltage, extremity leads Borderline prolonged QT interval No significant change since last tracing Confirmed by Roselyn Dunnings [phone]) on 05/31/2024 3:45:04 AM  Procedures Procedures  Medications Ordered in the ED Medications  sodium zirconium cyclosilicate  (LOKELMA ) packet 10 g (has no administration in time range)  calcium  gluconate 1 g/ 50 mL sodium chloride  IVPB (1,000 mg Intravenous New Bag/Given 05/31/24 0437)    Initial Impression and Plan  Patient with complex PMH here for AMS. Per EMS, she missed her dialysis yesterday due to transportation issues. She is confused here without focal neuro deficits. Suspect hepatic encephalopathy, uremia or other metabolic etiology. Will check labs, CXR, CT. Monitor in the ED  ED Course   Clinical Course as of 05/31/24 0520  Tue May 31, 2024  0401 CBC with anemia, worse than baseline, but not in need of emergent transfusion.  [CS]  0419 I personally viewed the images from radiology studies and agree with radiologist interpretation: CT head neg for acute process, CXR with vascular congestion.  [CS]  0422 CMP with ESRD, mild hyperkalemia, LFTs normal. Will give Lokelma  and CaGluc [CS]  0423 INR is normal.  [CS]  0424 Ammonia is mildly elevated. Unclear if this is the cause of her AMS. Will add EtOH. She will need admission for dialysis today.  [CS]  0504 ETOH is neg. Will discuss with hospitalist for admission.  [CS]  [phone] Spoke with Dr. Manfred, Hospitalist, who will evaluate the patient for admission.  [CS]    Clinical Course User Index [CS] Roselyn Dunnings NOVAK, MD     MDM Rules/Calculators/A&P Medical Decision Making Problems Addressed: Altered mental status, unspecified altered mental status type: acute illness or injury ESRD on  hemodialysis [facility]): chronic illness or injury History of cirrhosis of liver: chronic illness or injury Uremia: chronic illness or injury  Amount and/or Complexity of Data Reviewed Labs: ordered. Decision-making details documented in ED Course. Radiology: ordered and independent interpretation performed. Decision-making details documented in ED Course. ECG/medicine tests: ordered and independent interpretation performed. Decision-making details documented in ED Course.  Risk Prescription drug management. Decision regarding hospitalization.     Final Clinical Impression(s) / ED Diagnoses Final diagnoses:  Altered mental status, unspecified altered mental status type  ESRD on hemodialysis (HCC)  Uremia  History of cirrhosis of liver    Rx / DC Orders ED Discharge Orders  None        Roselyn Carlin NOVAK, MD 05/31/24 [phone]

## 2024-05-31 NOTE — Progress Notes (Signed)
 EEG complete - results pending

## 2024-05-31 NOTE — Progress Notes (Addendum)
 PROGRESS NOTE  Cathy [name] FMW:[id number] DOB: 02-13-1981 DOA: 05/31/2024 PCP: Terry Wilhelmena Lloyd Hilario, FNP  Brief History:  History per Dr. Manfred H&P 43 y.o. female with medical history significant of ESRD on HD (MWF), prior PE, NASH, systolic and diastolic CHF, hypertension, and SLE who presented to the ED from home via EMS due to altered mental status which started shortly PTA.  Patient was unable to provide history, history was obtained from ED physician and ED medical record.  Per report, last known well was 2 AM, around 3 AM, she was noted to be more confused, so EMS was activated and on arrival to the ED she was able to follow-up most commands, though, she did not answer questions.  She was usually compliant with her dialysis, but she missed her dialysis yesterday due to transportation issues.   ED Course:  In the emergency department, temperature was 97.3, patient was tachypneic and bradycardic.  BP was 153/85 and O2 sat was 98% on room air.  CBC showed normocytic anemia and thrombocytopenia.  BMP showed hyperkalemia, blood glucose 119, BUN 59, creatinine 1.81, albumin  3.1, ALP 227, ammonia 42, alcohol level was less than 15. Chest x-ray showed chronic cardiomegaly with increased pulmonary vascular congestion with consideration for mild or developing interstitial edema.  Pleural fluid chronically tracking in the right lung field shows, stable to mildly increased since April. Calcium  gluconate was given, Lokelma  was given.  [facility] was asked to admit patient   Assessment/Plan: Acute metabolic encephalopathy -Likely multifactorial including missing dialysis on 05/30/2024, possible seizure, elevated ammonia -According to the patient's family, she has been compliant with dialysis open till missing dialysis on 05/30/2024 which was her usual dialysis day - Obtain EEG - B12 - TSH - CT brain negative - Neurology consultation  ESRD - Nephrology consulted for dialysis - She  dialyzes Monday, Wednesday, Friday  Hyperammonemia - Ammonia 42 - Unclear if this is truly contributing to the patient's altered mental status - Continue lactulose  for now - Repeat ammonia in the morning  Hyperkalemia - Patient will be dialyzed  Fluid overload - Management per dialysis  Acute on chronic HFrEF - 01/30/22 echo EF 35-40%, grade 3 DD, mild decreased RVF - Chest x-ray with vascular congestion - Fluid management per dialysis  Liver Cirrhosis -follows [facility] -felt to be possible multifactorial including lupus, NASH  Essential hypertension - Restart amlodipine  and hydralazine  if able to tolerate p.o.    Family Communication:   mother and sister at bedside 7/1  Consultants:  renal, neurology  Code Status:  FULL  DVT Prophylaxis:  [place] Heparin     Procedures: As Listed in Progress Note Above  Antibiotics: None     Total time spent 50 minutes.  Greater than 50% spent face to face counseling and coordinating care.    Subjective: Unobtainable due to altered mental status  Objective: Vitals:   05/31/24 0515 05/31/24 0530 05/31/24 0545 05/31/24 0726  BP: (!) 156/121 (!) 167/90 (!) 150/87   Pulse: (!) 51 (!) 50 (!) 48   Resp: (!) 23 20 16    Temp:      TempSrc:      SpO2: 100% 99% 100%   Height:    5' 7 (1.702 m)   No intake or output data in the 24 hours ending 05/31/24 0827 Weight change:  Exam:  General:  Pt is alert, follows commands appropriately, not in acute distress HEENT: No icterus, No thrush, No  neck mass, [place]/AT Cardiovascular: RRR, S1/S2, no rubs, no gallops Respiratory: bibasilar crackles Abdomen: Soft/+BS, non tender, non distended, no guarding Extremities: No edema, No lymphangitis, No petechiae, No rashes, no synovitis   Data Reviewed: I have personally reviewed following labs and imaging studies Basic Metabolic Panel: Recent Labs  Lab 05/31/24 0342  NA 140  K 6.1*  CL 98  CO2 24  GLUCOSE 119*  BUN 59*   CREATININE 11.81*  CALCIUM  7.9*  MG 2.1  PHOS 8.7*   Liver Function Tests: Recent Labs  Lab 05/31/24 0342  AST 21  ALT 13  ALKPHOS 227*  BILITOT 0.7  PROT 6.6  ALBUMIN  3.1*   No results for input(s): LIPASE, AMYLASE in the last 168 hours. Recent Labs  Lab 05/31/24 0342  AMMONIA 42*   Coagulation Profile: Recent Labs  Lab 05/31/24 0342  INR 1.1   CBC: Recent Labs  Lab 05/31/24 0342  WBC 7.1  NEUTROABS 5.9  HGB 8.0*  HCT 25.2*  MCV 94.7  PLT 142*   Cardiac Enzymes: No results for input(s): CKTOTAL, CKMB, CKMBINDEX, TROPONINI in the last 168 hours. BNP: Invalid input(s): POCBNP CBG: Recent Labs  Lab 05/31/24 0409  GLUCAP 142*   HbA1C: No results for input(s): HGBA1C in the last 72 hours. Urine analysis:    Component Value Date/Time   COLORURINE YELLOW 07/05/2023 1146   APPEARANCEUR Clear 01/29/2024 1022   LABSPEC 1.013 07/05/2023 1146   PHURINE 8.0 07/05/2023 1146   GLUCOSEU Negative 01/29/2024 1022   HGBUR NEGATIVE 07/05/2023 1146   BILIRUBINUR Negative 01/29/2024 1022   KETONESUR NEGATIVE 07/05/2023 1146   PROTEINUR Negative 01/29/2024 1022   PROTEINUR >=300 (A) 07/05/2023 1146   UROBILINOGEN 0.2 10/17/2020 1026   NITRITE Negative 01/29/2024 1022   NITRITE NEGATIVE 07/05/2023 1146   LEUKOCYTESUR Negative 01/29/2024 1022   LEUKOCYTESUR SMALL (A) 07/05/2023 1146   Sepsis Labs: @LABRCNTIP (procalcitonin:4,lacticidven:4) )No results found for this or any previous visit (from the past 240 hours).   Scheduled Meds:  Chlorhexidine  Gluconate Cloth  6 each Topical Daily   heparin   5,000 Units Subcutaneous Q8H   lactulose   300 mL Rectal Once   sodium zirconium cyclosilicate   10 g Oral Once   Continuous Infusions:  albumin  human      Procedures/Studies: DG Chest Port 1 View Result Date: 05/31/2024 CLINICAL DATA:  43 year old female with altered mental status, end stage renal disease. EXAM: PORTABLE CHEST 1 VIEW COMPARISON:   Chest radiographs 03/02/2024 and earlier. FINDINGS: Portable AP upright view at 0353 hours. Stable left chest dual lumen dialysis type catheter. Mildly lower lung volumes. Stable cardiomegaly and mediastinal contours. Visualized tracheal air column is within normal limits. Veiling opacity along the right minor fissure has mildly increased, pleural fluid tracking in the right lung fissures on the previous exam. No superimposed pneumothorax. Bilateral pulmonary vascularity appears increased. Stable left lung base ventilation, no pleural effusion identified previously. Paucity of bowel gas in the upper abdomen. No acute osseous abnormality identified. IMPRESSION: 1. Chronic cardiomegaly with increased pulmonary vascular congestion. Consider mild or developing interstitial edema. 2. Pleural fluid chronically tracking in the right lung fissures, stable to mildly increased since April. Electronically Signed   By: VEAR Hurst M.D.   On: 05/31/2024 04:16   CT Head Wo Contrast Result Date: 05/31/2024 CLINICAL DATA:  Altered mental status EXAM: CT HEAD WITHOUT CONTRAST TECHNIQUE: Contiguous axial images were obtained from the base of the skull through the vertex without intravenous contrast. RADIATION DOSE REDUCTION: This exam was  performed according to the departmental dose-optimization program which includes automated exposure control, adjustment of the mA and/or kV according to patient size and/or use of iterative reconstruction technique. COMPARISON:  09/01/2016 FINDINGS: Brain: No evidence of acute infarction, hemorrhage, hydrocephalus, extra-axial collection or mass lesion/mass effect. Encephalomalacia in the left frontal lobe laterally is noted consistent with prior ischemia. Vascular: No hyperdense vessel or unexpected calcification. Skull: Normal. Negative for fracture or focal lesion. Sinuses/Orbits: No acute finding. Other: None. IMPRESSION: No acute intracranial abnormality noted. Electronically Signed   By: Oneil Devonshire M.D.   On: 05/31/2024 04:04    Alm Schneider, DO  [facility]  If 7PM-7AM, please contact night-coverage [email or web address] Password [email or web address] 05/31/2024, 8:27 AM   LOS: 0 days

## 2024-05-31 NOTE — Consult Note (Addendum)
 [facility] Renal Consultation Note    Indication for Consultation:  Management of ESRD/hemodialysis; anemia, hypertension/volume and secondary hyperparathyroidism  HPI: Cathy [name] is a 43 y.o. female with a PMH significant for HTN, chronic combines systolic and diastolic CHF, NASH, h/o SLE, h/o PE, NASH, and ESKD on HD MWF at [facility] who presented to [facility] ED via EMS after she was found to have seizure like activity at 2 am and has been confused since.  In th ED, Temp 97.3, Bp 153/85, HR 53, RR 21, SpO2 97%.  Labs notable for Hgb 8, K 6.1, ammonia 42, glucose 142.  CT of head w/o contrast showed no acute intracranial abnormality.  CXR with cardiomegaly and increased pulmonary vascular congestion.  She was admitted to the ICU and we were consulted to provide dialysis during her hospitalization.  She missed HD per family due to transportation issues.  She is currently unresponsive and nonverbal.  This HPI was obtained by speaking with her family at the bedside and review of the EMR.  Past Medical History:  Diagnosis Date   Anasarca associated with disorder of kidney 06/08/2021   Anxiety    Asthma    Bipolar depression (HCC)    Cirrhosis (HCC)    Depression    Dyspnea    ESRD (end stage renal disease) on dialysis (HCC) 10/2020   GERD (gastroesophageal reflux disease)    Gonorrhea    Lupus    Pelvic inflammatory disease (PID)    Pleural effusion 06/08/2021   Pulmonary embolism (HCC) 01/31/2022   Renal hypertension    Trichomonas infection    Past Surgical History:  Procedure Laterality Date   A/V FISTULAGRAM N/A 02/22/2024   Procedure: A/V Fistulagram;  Surgeon: Sheree Penne Bruckner, MD;  Location: [facility] INVASIVE CV LAB;  Service: Cardiovascular;  Laterality: N/A;   AV FISTULA PLACEMENT Left 06/10/2022   Procedure: LEFT ARM ARTERIOVENOUS (AV) FISTULA CREATION;  Surgeon: Oris Krystal FALCON, MD;  Location: [facility] ORS;  Service: Vascular;  Laterality: Left;   BASCILIC  VEIN TRANSPOSITION Left 03/24/2023   Procedure: LEFT ARM SECOND STAGE BASILIC VEIN TRANSPOSITION;  Surgeon: Oris Krystal FALCON, MD;  Location: [facility] ORS;  Service: Vascular;  Laterality: Left;   CENTRAL LINE INSERTION-TUNNELED N/A 02/22/2024   Procedure: CENTRAL LINE INSERTION-TUNNELED;  Surgeon: Sheree Penne Bruckner, MD;  Location: [facility] INVASIVE CV LAB;  Service: Cardiovascular;  Laterality: N/A;   ESOPHAGEAL DILATION N/A 05/10/2024   Procedure: DILATION, ESOPHAGUS;  Surgeon: Eartha Angelia Sieving, MD;  Location: [facility] ENDO SUITE;  Service: Gastroenterology;  Laterality: N/A;   ESOPHAGOGASTRODUODENOSCOPY N/A 05/10/2024   Procedure: EGD (ESOPHAGOGASTRODUODENOSCOPY);  Surgeon: Eartha Angelia, Sieving, MD;  Location: [facility] ENDO SUITE;  Service: Gastroenterology;  Laterality: N/A;  8:45 am, asa 3  dialysis M,W & F   IR FLUORO GUIDE CV LINE RIGHT  11/30/2020   IR FLUORO GUIDE CV LINE RIGHT  06/13/2021   IR FLUORO GUIDE CV LINE RIGHT  02/01/2022   IR REMOVAL TUN CV CATH W/O FL  06/11/2021   IR REMOVAL TUN CV CATH W/O FL  01/29/2022   IR US  GUIDE VASC ACCESS RIGHT  11/30/2020   IR US  GUIDE VASC ACCESS RIGHT  06/13/2021   IR US  GUIDE VASC ACCESS RIGHT  02/03/2022   RENAL BIOPSY     TEE WITHOUT CARDIOVERSION N/A 06/13/2021   Procedure: TRANSESOPHAGEAL ECHOCARDIOGRAM (TEE);  Surgeon: Delford Maude BROCKS, MD;  Location: [facility] ENDOSCOPY;  Service: Cardiovascular;  Laterality: N/A;   TEE WITHOUT CARDIOVERSION N/A 02/03/2022  Procedure: TRANSESOPHAGEAL ECHOCARDIOGRAM (TEE);  Surgeon: Tobb, Kardie, DO;  Location: [facility] ENDOSCOPY;  Service: Cardiovascular;  Laterality: N/A;   TUBAL LIGATION N/A 02/03/2019   Procedure: POST PARTUM TUBAL LIGATION;  Surgeon: Izell Harari, MD;  Location: [facility] LD ORS;  Service: Gynecology;  Laterality: N/A;   Family History:   Family History  Problem Relation Age of Onset   Anxiety disorder Mother    Sleep apnea Father    Diabetes Maternal Grandmother    Hypertension Maternal Grandmother    Diabetes  Maternal Grandfather    Hypertension Maternal Grandfather    Diabetes Paternal Grandmother    Hypertension Paternal Grandmother    Diabetes Paternal Grandfather    Hypertension Paternal Grandfather    Social History:  reports that she has been smoking cigarettes. She started smoking about 25 years ago. She has a 6.4 pack-year smoking history. She has never used smokeless tobacco. She reports that she does not currently use alcohol. She reports current drug use. Frequency: 3.00 times per week. Drug: Marijuana. Allergies  Allergen Reactions   Dialyvite 800 [Nephro-Vite] Nausea And Vomiting   Prior to Admission medications   Medication Sig Start Date End Date Taking? Authorizing Provider  b complex-vitamin c-folic acid  (NEPHRO-VITE) 0.8 MG TABS tablet Take 1 tablet by mouth daily. 04/22/24  Yes [provider]  busPIRone  (BUSPAR ) 30 MG tablet Take 30 mg by mouth. 05/12/24  Yes [provider]  acetaminophen  (TYLENOL ) 500 MG tablet Take 1,000 mg by mouth every 6 (six) hours as needed.    [provider]  albuterol  (VENTOLIN  HFA) 108 (90 Base) MCG/ACT inhaler Inhale 2 puffs into the lungs every 6 (six) hours as needed for wheezing or shortness of breath. 12/18/23   Del Orbe Polanco, Iliana, FNP  amLODipine  (NORVASC ) 10 MG tablet Take 1 tablet (10 mg total) by mouth in the morning. 08/27/23   Del Wilhelmena Lloyd Sola, FNP  Blood Pressure Monitoring (OMRON 3 SERIES BP MONITOR) DEVI Use as directed 2 times a day 08/27/23   Del Wilhelmena Lloyd, Sola, FNP  camphor-menthol  [facility]) lotion Apply topically as needed for itching. 02/23/24   Tobie Yetta HERO, MD  carvedilol  (COREG ) 25 MG tablet Take 1 tablet (25 mg total) by mouth 2 (two) times daily with a meal. 08/27/23   Del Wilhelmena Lloyd, Sola, FNP  cloNIDine  (CATAPRES ) 0.3 MG tablet Take 1 tablet (0.3 mg total) by mouth 3 (three) times daily. 08/27/23   Del Wilhelmena Lloyd Sola, FNP  hydrALAZINE  (APRESOLINE ) 100 MG tablet Take 1 tablet  (100 mg total) by mouth 3 (three) times daily. 08/27/23   Del Orbe Polanco, Iliana, FNP  hydrOXYzine  (ATARAX ) 25 MG tablet Take 25 mg by mouth every 12 (twelve) hours as needed. 03/30/24   [provider]  losartan  (COZAAR ) 50 MG tablet Take 50 mg by mouth at bedtime. 01/27/23   [provider]  pantoprazole  (PROTONIX ) 40 MG tablet Take 1 tablet (40 mg total) by mouth daily before breakfast. 02/23/24 04/28/24  Tobie Yetta HERO, MD  propranolol  (INDERAL ) 10 MG tablet TAKE 1 TABLET BY MOUTH EVERY 12 HOURS AS NEEDED. 02/15/24   Del Wilhelmena Lloyd, Sola, FNP  topiramate  (TOPAMAX ) 50 MG tablet TAKE 1 TABLET BY MOUTH EVERY DAY 12/21/23   Del Orbe Polanco, Iliana, FNP   Current Facility-Administered Medications  Medication Dose Route Frequency Provider Last Rate Last Admin   albumin  human 25 % solution 0-100 g  0-100 g Intravenous Once PRN Adefeso, Oladapo, DO  Chlorhexidine  Gluconate Cloth 2 % PADS 6 each  6 each Topical Daily Tat, David, MD       heparin  injection 5,000 Units  5,000 Units Subcutaneous Q8H Adefeso, Oladapo, DO       hydrALAZINE  (APRESOLINE ) injection 10 mg  10 mg Intravenous Q6H PRN Tat, Alm, MD       lactulose  (CHRONULAC ) enema 200 gm  300 mL Rectal Once Adefeso, Oladapo, DO       prochlorperazine  (COMPAZINE ) injection 10 mg  10 mg Intravenous Q6H PRN Adefeso, Oladapo, DO       sodium zirconium cyclosilicate  (LOKELMA ) packet 10 g  10 g Oral Once Roselyn Carlin NOVAK, MD       Labs: Basic Metabolic Panel: Recent Labs  Lab 05/31/24 0342  NA 140  K 6.1*  CL 98  CO2 24  GLUCOSE 119*  BUN 59*  CREATININE 11.81*  CALCIUM  7.9*  PHOS 8.7*   Liver Function Tests: Recent Labs  Lab 05/31/24 0342  AST 21  ALT 13  ALKPHOS 227*  BILITOT 0.7  PROT 6.6  ALBUMIN  3.1*   No results for input(s): LIPASE, AMYLASE in the last 168 hours. Recent Labs  Lab 05/31/24 0342  AMMONIA 42*   CBC: Recent Labs  Lab 05/31/24 0342  WBC 7.1  NEUTROABS 5.9  HGB 8.0*   HCT 25.2*  MCV 94.7  PLT 142*   Cardiac Enzymes: No results for input(s): CKTOTAL, CKMB, CKMBINDEX, TROPONINI in the last 168 hours. CBG: Recent Labs  Lab 05/31/24 0409  GLUCAP 142*   Iron  Studies: No results for input(s): IRON , TIBC, TRANSFERRIN, FERRITIN in the last 72 hours. Studies/Results: DG Chest Port 1 View Result Date: 05/31/2024 CLINICAL DATA:  43 year old female with altered mental status, end stage renal disease. EXAM: PORTABLE CHEST 1 VIEW COMPARISON:  Chest radiographs 03/02/2024 and earlier. FINDINGS: Portable AP upright view at 0353 hours. Stable left chest dual lumen dialysis type catheter. Mildly lower lung volumes. Stable cardiomegaly and mediastinal contours. Visualized tracheal air column is within normal limits. Veiling opacity along the right minor fissure has mildly increased, pleural fluid tracking in the right lung fissures on the previous exam. No superimposed pneumothorax. Bilateral pulmonary vascularity appears increased. Stable left lung base ventilation, no pleural effusion identified previously. Paucity of bowel gas in the upper abdomen. No acute osseous abnormality identified. IMPRESSION: 1. Chronic cardiomegaly with increased pulmonary vascular congestion. Consider mild or developing interstitial edema. 2. Pleural fluid chronically tracking in the right lung fissures, stable to mildly increased since April. Electronically Signed   By: VEAR Hurst M.D.   On: 05/31/2024 04:16   CT Head Wo Contrast Result Date: 05/31/2024 CLINICAL DATA:  Altered mental status EXAM: CT HEAD WITHOUT CONTRAST TECHNIQUE: Contiguous axial images were obtained from the base of the skull through the vertex without intravenous contrast. RADIATION DOSE REDUCTION: This exam was performed according to the departmental dose-optimization program which includes automated exposure control, adjustment of the mA and/or kV according to patient size and/or use of iterative reconstruction  technique. COMPARISON:  09/01/2016 FINDINGS: Brain: No evidence of acute infarction, hemorrhage, hydrocephalus, extra-axial collection or mass lesion/mass effect. Encephalomalacia in the left frontal lobe laterally is noted consistent with prior ischemia. Vascular: No hyperdense vessel or unexpected calcification. Skull: Normal. Negative for fracture or focal lesion. Sinuses/Orbits: No acute finding. Other: None. IMPRESSION: No acute intracranial abnormality noted. Electronically Signed   By: Oneil Devonshire M.D.   On: 05/31/2024 04:04    ROS: Review of systems not obtained  due to patient factors. Physical Exam: Vitals:   05/31/24 0515 05/31/24 0530 05/31/24 0545 05/31/24 0726  BP: (!) 156/121 (!) 167/90 (!) 150/87   Pulse: (!) 51 (!) 50 (!) 48   Resp: (!) 23 20 16    Temp:      TempSrc:      SpO2: 100% 99% 100%   Height:    5' 7 (1.702 m)      Weight change:  No intake or output data in the 24 hours ending 05/31/24 0852 BP (!) 150/87   Pulse (!) 48   Temp (!) 97.3 F (36.3 C) (Axillary)   Resp 16   Ht 5' 7 (1.702 m)   LMP 04/07/2020 (Within Months)   SpO2 100%   BMI 24.86 kg/m  General appearance: uncooperative and nonverbal Head: Normocephalic, without obvious abnormality, atraumatic Resp: clear to auscultation bilaterally Cardio: bradycardic, no rub GI: distended, +BS, soft, + fluid wave Extremities: extremities normal, atraumatic, no cyanosis or edema and LUE AVF +T/B Dialysis Access:  Dialysis Orders: Center:  [facility] [address]  on MWF . EDW 62.5kg HD Bath 2K/2Ca  Time 4:00 Heparin  500 bolus then 500 units/hr. Access LIJ TDC and LUE AVF BFR 400 DFR 600      Assessment/Plan:  Acute metabolic encephalopathy - possible seizure and post-ictal state.  Awaiting EEG. Doubt missing one HD session could cause this, especially with a BUN of only 59.  Further workup per primary svc.  Should also send tox screen.  ESRD -  missed HD yesterday.  Plan on urgent HD given hyperkalemia  and bradycardia.  She was already given IV Calcium .  Hyperkalemia - will use low K bath and follow response.  Hypertension/volume  -  UF with HD today  Anemia  -  will check iron  stores and resume ESA.  Transfuse for Hgb <7  Metabolic bone disease -   npo for now  Nutrition -  npo for now  NASH cirrhosis with elevated ammonia - continue lactulose   Acute on chronic combined systolic and diastolic CHF - UF with HD  Fairy RONAL Sellar, MD [facility], [facility] 05/31/2024, 8:52 AM    The patient is critically ill with Acute metabolic encephalopathy, HTN, volume overload, and hyperkalemia and which includes my role to primarily manage electrolyte abnormalities and volume overload.  This requires high complexity decision making.  Total critical care time: 30 minutes    Critical care time was exclusive of treating other patients.   Critical care was necessary to treat or prevent imminent or life-threatening deterioration.   Critical care was time spent personally by me on the following activities:   development of treatment plan with patient and/or surrogate as well as nursing,   discussions with other provider evaluation of patient's response to treatment  examination of patient  obtaining history from patient or surrogate  ordering and performing treatments and interventions  ordering and review of laboratory studies  ordering and review of radiographic studies

## 2024-05-31 NOTE — Hospital Course (Signed)
 History per Dr. Manfred H&P 43 y.o. female with medical history significant of ESRD on HD (MWF), prior PE, NASH, systolic and diastolic CHF, hypertension, and SLE who presented to the ED from home via EMS due to altered mental status which started shortly PTA.  Patient was unable to provide history, history was obtained from ED physician and ED medical record.  Per report, last known well was 2 AM, around 3 AM, she was noted to be more confused, so EMS was activated and on arrival to the ED she was able to follow-up most commands, though, she did not answer questions.  She was usually compliant with her dialysis, but she missed her dialysis yesterday due to transportation issues.   ED Course:  In the emergency department, temperature was 97.3, patient was tachypneic and bradycardic.  BP was 153/85 and O2 sat was 98% on room air.  CBC showed normocytic anemia and thrombocytopenia.  BMP showed hyperkalemia, blood glucose 119, BUN 59, creatinine 1.81, albumin  3.1, ALP 227, ammonia 42, alcohol level was less than 15. Chest x-ray showed chronic cardiomegaly with increased pulmonary vascular congestion with consideration for mild or developing interstitial edema.  Pleural fluid chronically tracking in the right lung field shows, stable to mildly increased since April. Calcium  gluconate was given, Lokelma  was given.  TRH was asked to admit patient

## 2024-05-31 NOTE — ED Triage Notes (Addendum)
 Pt bib [facility] c/o AMS. Per EMS family reported that pt was found to be altered around 0300, LKW 0200. Pt responsive to verbal stimuli, able to follow most commands but will not answer question. Pt is on dialysis, MWF, missed dialysis Monday.   HX NASH, lupus, and ESRD  Dr. Roselyn at bedside

## 2024-05-31 NOTE — H&P (Addendum)
 History and Physical    Patient: Cathy [name] DOB: Jan 02, 1981 DOA: 05/31/2024 DOS: the patient was seen and examined on 05/31/2024 PCP: Del Wilhelmena Lloyd Sola, FNP  Patient coming from: Home  Chief Complaint:  Chief Complaint  Patient presents with   Altered Mental Status   HPI: Cathy [name] is a 43 y.o. female with medical history significant of ESRD on HD (MWF), prior PE, NASH, systolic and diastolic CHF, hypertension, and SLE who presented to the ED from home via EMS due to altered mental status which started shortly PTA.  Patient was unable to provide history, history was obtained from ED physician and ED medical record.  Per report, last known well was 2 AM, around 3 AM, she was noted to be more confused, so EMS was activated and on arrival to the ED she was able to follow-up most commands, though, she did not answer questions.  She was usually compliant with her dialysis, but she missed her dialysis yesterday due to transportation issues.  ED Course:  In the emergency department, temperature was 97.3, patient was tachypneic and bradycardic.  BP was 153/85 and O2 sat was 98% on room air.  CBC showed normocytic anemia and thrombocytopenia.  BMP showed hyperkalemia, blood glucose 119, BUN 59, creatinine 1.81, albumin  3.1, ALP 227, ammonia 42, alcohol level was less than 15. Chest x-ray showed chronic cardiomegaly with increased pulmonary vascular congestion with consideration for mild or developing interstitial edema.  Pleural fluid chronically tracking in the right lung field shows, stable to mildly increased since April. Calcium  gluconate was given, Lokelma  was given.  TRH was asked to admit patient  Review of Systems: Review of systems as noted in the HPI. All other systems reviewed and are negative.   Past Medical History:  Diagnosis Date   Anasarca associated with disorder of kidney 06/08/2021   Anxiety    Asthma    Bipolar depression (HCC)    Cirrhosis  (HCC)    Depression    Dyspnea    ESRD (end stage renal disease) on dialysis (HCC) 10/2020   GERD (gastroesophageal reflux disease)    Gonorrhea    Lupus    Pelvic inflammatory disease (PID)    Pleural effusion 06/08/2021   Pulmonary embolism (HCC) 01/31/2022   Renal hypertension    Trichomonas infection    Past Surgical History:  Procedure Laterality Date   A/V FISTULAGRAM N/A 02/22/2024   Procedure: A/V Fistulagram;  Surgeon: Sheree Penne Bruckner, MD;  Location: [facility] INVASIVE CV LAB;  Service: Cardiovascular;  Laterality: N/A;   AV FISTULA PLACEMENT Left 06/10/2022   Procedure: LEFT ARM ARTERIOVENOUS (AV) FISTULA CREATION;  Surgeon: Oris Krystal FALCON, MD;  Location: [facility] ORS;  Service: Vascular;  Laterality: Left;   BASCILIC VEIN TRANSPOSITION Left 03/24/2023   Procedure: LEFT ARM SECOND STAGE BASILIC VEIN TRANSPOSITION;  Surgeon: Oris Krystal FALCON, MD;  Location: [facility] ORS;  Service: Vascular;  Laterality: Left;   CENTRAL LINE INSERTION-TUNNELED N/A 02/22/2024   Procedure: CENTRAL LINE INSERTION-TUNNELED;  Surgeon: Sheree Penne Bruckner, MD;  Location: [facility] INVASIVE CV LAB;  Service: Cardiovascular;  Laterality: N/A;   ESOPHAGEAL DILATION N/A 05/10/2024   Procedure: DILATION, ESOPHAGUS;  Surgeon: Eartha Angelia Sieving, MD;  Location: [facility] ENDO SUITE;  Service: Gastroenterology;  Laterality: N/A;   ESOPHAGOGASTRODUODENOSCOPY N/A 05/10/2024   Procedure: EGD (ESOPHAGOGASTRODUODENOSCOPY);  Surgeon: Eartha Angelia, Sieving, MD;  Location: [facility] ENDO SUITE;  Service: Gastroenterology;  Laterality: N/A;  8:45 am, asa 3  dialysis M,W & F  IR FLUORO GUIDE CV LINE RIGHT  11/30/2020   IR FLUORO GUIDE CV LINE RIGHT  06/13/2021   IR FLUORO GUIDE CV LINE RIGHT  02/01/2022   IR REMOVAL TUN CV CATH W/O FL  06/11/2021   IR REMOVAL TUN CV CATH W/O FL  01/29/2022   IR US  GUIDE VASC ACCESS RIGHT  11/30/2020   IR US  GUIDE VASC ACCESS RIGHT  06/13/2021   IR US  GUIDE VASC ACCESS RIGHT  02/03/2022   RENAL BIOPSY     TEE  WITHOUT CARDIOVERSION N/A 06/13/2021   Procedure: TRANSESOPHAGEAL ECHOCARDIOGRAM (TEE);  Surgeon: Delford Maude BROCKS, MD;  Location: [facility] ENDOSCOPY;  Service: Cardiovascular;  Laterality: N/A;   TEE WITHOUT CARDIOVERSION N/A 02/03/2022   Procedure: TRANSESOPHAGEAL ECHOCARDIOGRAM (TEE);  Surgeon: Tobb, Kardie, DO;  Location: [facility] ENDOSCOPY;  Service: Cardiovascular;  Laterality: N/A;   TUBAL LIGATION N/A 02/03/2019   Procedure: POST PARTUM TUBAL LIGATION;  Surgeon: Izell Harari, MD;  Location: [facility] LD ORS;  Service: Gynecology;  Laterality: N/A;    Social History:  reports that she has been smoking cigarettes. She started smoking about 25 years ago. She has a 6.4 pack-year smoking history. She has never used smokeless tobacco. She reports that she does not currently use alcohol. She reports current drug use. Frequency: 3.00 times per week. Drug: Marijuana.   Allergies  Allergen Reactions   Dialyvite 800 [Nephro-Vite] Nausea And Vomiting    Family History  Problem Relation Age of Onset   Anxiety disorder Mother    Sleep apnea Father    Diabetes Maternal Grandmother    Hypertension Maternal Grandmother    Diabetes Maternal Grandfather    Hypertension Maternal Grandfather    Diabetes Paternal Grandmother    Hypertension Paternal Grandmother    Diabetes Paternal Grandfather    Hypertension Paternal Grandfather      Prior to Admission medications   Medication Sig Start Date End Date Taking? Authorizing Provider  acetaminophen  (TYLENOL ) 500 MG tablet Take 1,000 mg by mouth every 6 (six) hours as needed.    [provider]  albuterol  (VENTOLIN  HFA) 108 (90 Base) MCG/ACT inhaler Inhale 2 puffs into the lungs every 6 (six) hours as needed for wheezing or shortness of breath. 12/18/23   Del Orbe Polanco, Iliana, FNP  amLODipine  (NORVASC ) 10 MG tablet Take 1 tablet (10 mg total) by mouth in the morning. 08/27/23   Del Wilhelmena Lloyd Sola, FNP  Blood Pressure Monitoring (OMRON 3 SERIES BP  MONITOR) DEVI Use as directed 2 times a day 08/27/23   Del Wilhelmena Lloyd Sola, FNP  busPIRone  (BUSPAR ) 7.5 MG tablet Take 7.5 mg by mouth daily. 03/30/24   [provider]  camphor-menthol  VIKKI) lotion Apply topically as needed for itching. 02/23/24   Tobie Yetta HERO, MD  carvedilol  (COREG ) 25 MG tablet Take 1 tablet (25 mg total) by mouth 2 (two) times daily with a meal. 08/27/23   Del Wilhelmena Lloyd, Sola, FNP  cloNIDine  (CATAPRES ) 0.3 MG tablet Take 1 tablet (0.3 mg total) by mouth 3 (three) times daily. 08/27/23   Del Orbe Polanco, Iliana, FNP  hydrALAZINE  (APRESOLINE ) 100 MG tablet Take 1 tablet (100 mg total) by mouth 3 (three) times daily. 08/27/23   Del Orbe Polanco, Iliana, FNP  hydrOXYzine  (ATARAX ) 25 MG tablet Take 25 mg by mouth every 12 (twelve) hours as needed. 03/30/24   [provider]  losartan  (COZAAR ) 50 MG tablet Take 50 mg by mouth at bedtime. 01/27/23   [provider]  pantoprazole  (  PROTONIX ) 40 MG tablet Take 1 tablet (40 mg total) by mouth daily before breakfast. 02/23/24 04/28/24  Tobie Yetta HERO, MD  propranolol  (INDERAL ) 10 MG tablet TAKE 1 TABLET BY MOUTH EVERY 12 HOURS AS NEEDED. 02/15/24   Del Wilhelmena Falter, Hilario, FNP  topiramate  (TOPAMAX ) 50 MG tablet TAKE 1 TABLET BY MOUTH EVERY DAY 12/21/23   Del Wilhelmena Falter Hilario, FNP    Physical Exam: BP (!) 150/87   Pulse (!) 48   Temp (!) 97.3 F (36.3 C) (Axillary)   Resp 16   LMP 04/07/2020 (Within Months)   SpO2 100%   General: 43 y.o. year-old female well developed well nourished in no acute distress.  Alert but disoriented. HEENT: NCAT, EOMI, dry mucous membrane Neck: Supple, trachea medial Cardiovascular: Bradycardia.  Regular rate and rhythm with no rubs or gallops.  No thyromegaly or JVD noted.  No lower extremity edema. 2/4 pulses in all 4 extremities. Respiratory: Clear to auscultation with no wheezes or rales. Good inspiratory effort. Vascular: AVF in LUE with thrill Abdomen: Soft,  nontender but distended with normal bowel sounds x4 quadrants. Muskuloskeletal: No cyanosis, clubbing or edema noted bilaterally Neuro: Responds to verbal stimuli.  Intermittent tremors noted in both hands.   Skin: No ulcerative lesions noted or rashes Psychiatry: This cannot be assessed at this time due to patient's current condition         Labs on Admission:  Basic Metabolic Panel: Recent Labs  Lab 05/31/24 0342  NA 140  K 6.1*  CL 98  CO2 24  GLUCOSE 119*  BUN 59*  CREATININE 11.81*  CALCIUM  7.9*   Liver Function Tests: Recent Labs  Lab 05/31/24 0342  AST 21  ALT 13  ALKPHOS 227*  BILITOT 0.7  PROT 6.6  ALBUMIN  3.1*   No results for input(s): LIPASE, AMYLASE in the last 168 hours. Recent Labs  Lab 05/31/24 0342  AMMONIA 42*   CBC: Recent Labs  Lab 05/31/24 0342  WBC 7.1  NEUTROABS 5.9  HGB 8.0*  HCT 25.2*  MCV 94.7  PLT 142*   Cardiac Enzymes: No results for input(s): CKTOTAL, CKMB, CKMBINDEX, TROPONINI in the last 168 hours.  BNP (last 3 results) No results for input(s): BNP in the last 8760 hours.  ProBNP (last 3 results) No results for input(s): PROBNP in the last 8760 hours.  CBG: Recent Labs  Lab 05/31/24 0409  GLUCAP 142*    Radiological Exams on Admission: DG Chest Port 1 View Result Date: 05/31/2024 CLINICAL DATA:  43 year old female with altered mental status, end stage renal disease. EXAM: PORTABLE CHEST 1 VIEW COMPARISON:  Chest radiographs 03/02/2024 and earlier. FINDINGS: Portable AP upright view at 0353 hours. Stable left chest dual lumen dialysis type catheter. Mildly lower lung volumes. Stable cardiomegaly and mediastinal contours. Visualized tracheal air column is within normal limits. Veiling opacity along the right minor fissure has mildly increased, pleural fluid tracking in the right lung fissures on the previous exam. No superimposed pneumothorax. Bilateral pulmonary vascularity appears increased. Stable left  lung base ventilation, no pleural effusion identified previously. Paucity of bowel gas in the upper abdomen. No acute osseous abnormality identified. IMPRESSION: 1. Chronic cardiomegaly with increased pulmonary vascular congestion. Consider mild or developing interstitial edema. 2. Pleural fluid chronically tracking in the right lung fissures, stable to mildly increased since April. Electronically Signed   By: VEAR Hurst M.D.   On: 05/31/2024 04:16   CT Head Wo Contrast Result Date: 05/31/2024 CLINICAL DATA:  Altered mental  status EXAM: CT HEAD WITHOUT CONTRAST TECHNIQUE: Contiguous axial images were obtained from the base of the skull through the vertex without intravenous contrast. RADIATION DOSE REDUCTION: This exam was performed according to the departmental dose-optimization program which includes automated exposure control, adjustment of the mA and/or kV according to patient size and/or use of iterative reconstruction technique. COMPARISON:  09/01/2016 FINDINGS: Brain: No evidence of acute infarction, hemorrhage, hydrocephalus, extra-axial collection or mass lesion/mass effect. Encephalomalacia in the left frontal lobe laterally is noted consistent with prior ischemia. Vascular: No hyperdense vessel or unexpected calcification. Skull: Normal. Negative for fracture or focal lesion. Sinuses/Orbits: No acute finding. Other: None. IMPRESSION: No acute intracranial abnormality noted. Electronically Signed   By: Oneil Devonshire M.D.   On: 05/31/2024 04:04    EKG: I independently viewed the EKG done and my findings are as followed: Sinus bradycardia at a rate of 53 bpm with QTc of 501 ms  Assessment/Plan Present on Admission:  Acute metabolic encephalopathy  Acute hyperkalemia  Interstitial edema  Cirrhosis of liver with ascites (HCC)  Acute on chronic combined systolic and diastolic CHF (congestive heart failure) (HCC)  Principal Problem:   Acute metabolic encephalopathy Active Problems:   ESRD on  hemodialysis (HCC)   Acute on chronic combined systolic and diastolic CHF (congestive heart failure) (HCC)   Cirrhosis of liver with ascites (HCC)   Interstitial edema   Acute hyperkalemia   Hyperammonemia (HCC)   NASH (nonalcoholic steatohepatitis)   Abnormal pleural fluid   Prolonged QT interval  Acute metabolic encephalopathy possibly due to multifactorial including uremia Patient missed dialysis yesterday Nephrology will be consulted for possible dialysis today Continue fall precaution  Hyperammonemia NASH Patient has history of NASH, ammonia was 42 This may also be a contributing factor to patient's metabolic encephalopathy Lactulose  will be provided  Hyperkalemia K+ 6.1 Calcium  gluconate was given Lokelma  was given Nephrology consult placed for possible dialysis this morning Continue to monitor potassium level  Pleural fluid and interstitial edema This may be due to fluid overload and can be corrected with dialysis Continue telemetry  Cirrhosis of liver with ascites secondary to NASH  Patient noted with abdominal distention Nephrology consulted for dialysis this morning Consider paracentesis if patient continues to have ascites s/p dialysis  Prolonged QT interval QTc 501 ms Avoid QT prolonging drugs K+ 6.1, Lokelma  was given; nephrology consulted for dialysis Magnesium  level will be checked Repeat EKG in the morning  ESRD on HD (MWF) Patient missed dialysis yesterday due to transportation issues Consult placed for nephrology for dialysis this morning  Acute on chronic systolic and diastolic CHF LVEF 35-40% on 2D echocardiogram in 2023 with grade 3 diastolic dysfunction Nephrology consult placed for dialysis this morning  Lupus nephritis Patient follows with hematology/oncology ( Dr. Davonna)   DVT prophylaxis: Subcu heparin   Code Status: Full code  Family Communication: None at bedside  Consults: Nephrology  Severity of Illness: The appropriate  patient status for this patient is INPATIENT. Inpatient status is judged to be reasonable and necessary in order to provide the required intensity of service to ensure the patient's safety. The patient's presenting symptoms, physical exam findings, and initial radiographic and laboratory data in the context of their chronic comorbidities is felt to place them at high risk for further clinical deterioration. Furthermore, it is not anticipated that the patient will be medically stable for discharge from the hospital within 2 midnights of admission.   * I certify that at the point of admission it is my  clinical judgment that the patient will require inpatient hospital care spanning beyond 2 midnights from the point of admission due to high intensity of service, high risk for further deterioration and high frequency of surveillance required.*  Author: Jalie Eiland, DO 05/31/2024 6:40 AM  For on call review [email or web address].

## 2024-05-31 NOTE — Procedures (Signed)
 Patient Name: Cathy [name]  MRN: [id number]  Epilepsy Attending: Arlin MALVA Krebs  Referring Physician/Provider: Evonnie Lenis, MD  Date: 05/31/2024 Duration: 23.27 mins  Patient history: 43 year old female with multiple medical comorbidities including end-stage renal disease who missed her dialysis on Monday morning and subsequently came after seizure-like episode. EEG to evaluate for seizure.  Level of alertness: Awake  AEDs during EEG study: LEV  Technical aspects: This EEG study was done with scalp electrodes positioned according to the 10-20 International system of electrode placement. Electrical activity was reviewed with band pass filter of 1-70Hz , sensitivity of 7 uV/mm, display speed of 83mm/sec with a 60Hz  notched filter applied as appropriate. EEG data were recorded continuously and digitally stored.  Video monitoring was available and reviewed as appropriate.  Description:  EEG showed continuous generalized predominantly 5 to 6 Hz theta slowing admixed with intermittent 2-3hz  delta slowing, at times with triphasic morphology. Hyperventilation and photic stimulation were not performed.      ABNORMALITY - Continuous slow, generalized   IMPRESSION: This study is suggestive of moderate diffuse encephalopathy. No seizures or epileptiform discharges were seen throughout the recording.   Jameson Tormey O Mohsin Crum

## 2024-05-31 NOTE — Consult Note (Addendum)
 I connected with  Cathy [name] on 05/31/24 by a video enabled telemedicine application and verified that I am speaking with the correct person using two identifiers.   I discussed the limitations of evaluation and management by telemedicine. The patient expressed understanding and agreed to proceed.  Location of patient: [facility] Location of physician: [facility]   Neurology Consultation Reason for Consult: Altered mental status Referring Physician: Dr. Posey Maier  CC: Altered mental status  History is obtained from: Patient, chart review  HPI: Cathy [name] is a 43 y.o. female with past medical history of end-stage renal disease on HD (Monday Wednesday Friday), prior PE, systolic and diastolic congestive heart failure, hypertension, SLE who was brought in due to altered mental status.  Per chart review and family at bedside, patient missed her dialysis on Monday morning due to transportation issues.  Her mother then found her to be confused, having seizure-like episode.  Since being in the hospital she remains confused, at times has jerking/twitching movements of right more than left upper extremity and nystagmus in her eyes.  She has not been following commands.  Therefore neurology was consulted for further recommendations.  Altered bedside denies prior history of seizures or epilepsy.  ROS:  Unable to obtain due to altered mental status.   Past Medical History:  Diagnosis Date   Anasarca associated with disorder of kidney 06/08/2021   Anxiety    Asthma    Bipolar depression (HCC)    Cirrhosis (HCC)    Depression    Dyspnea    ESRD (end stage renal disease) on dialysis (HCC) 10/2020   GERD (gastroesophageal reflux disease)    Gonorrhea    Lupus    Pelvic inflammatory disease (PID)    Pleural effusion 06/08/2021   Pulmonary embolism (HCC) 01/31/2022   Renal hypertension    Trichomonas infection     Family History  Problem Relation Age of  Onset   Anxiety disorder Mother    Sleep apnea Father    Diabetes Maternal Grandmother    Hypertension Maternal Grandmother    Diabetes Maternal Grandfather    Hypertension Maternal Grandfather    Diabetes Paternal Grandmother    Hypertension Paternal Grandmother    Diabetes Paternal Grandfather    Hypertension Paternal Grandfather     Social History:  reports that she has been smoking cigarettes. She started smoking about 25 years ago. She has a 6.4 pack-year smoking history. She has never used smokeless tobacco. She reports that she does not currently use alcohol. She reports current drug use. Frequency: 3.00 times per week. Drug: Marijuana.   Medications Prior to Admission  Medication Sig Dispense Refill Last Dose/Taking   b complex-vitamin c-folic acid  (NEPHRO-VITE) 0.8 MG TABS tablet Take 1 tablet by mouth daily.   Taking   busPIRone  (BUSPAR ) 30 MG tablet Take 30 mg by mouth.   Taking   acetaminophen  (TYLENOL ) 500 MG tablet Take 1,000 mg by mouth every 6 (six) hours as needed.      albuterol  (VENTOLIN  HFA) 108 (90 Base) MCG/ACT inhaler Inhale 2 puffs into the lungs every 6 (six) hours as needed for wheezing or shortness of breath. 8 g 2    amLODipine  (NORVASC ) 10 MG tablet Take 1 tablet (10 mg total) by mouth in the morning. 30 tablet 2    Blood Pressure Monitoring (OMRON 3 SERIES BP MONITOR) DEVI Use as directed 2 times a day 3 kit 0    camphor-menthol  (SARNA) lotion Apply topically  as needed for itching. 222 mL 0    carvedilol  (COREG ) 25 MG tablet Take 1 tablet (25 mg total) by mouth 2 (two) times daily with a meal. 60 tablet 1    cloNIDine  (CATAPRES ) 0.3 MG tablet Take 1 tablet (0.3 mg total) by mouth 3 (three) times daily. 90 tablet 2    hydrALAZINE  (APRESOLINE ) 100 MG tablet Take 1 tablet (100 mg total) by mouth 3 (three) times daily. 90 tablet 1    hydrOXYzine  (ATARAX ) 25 MG tablet Take 25 mg by mouth every 12 (twelve) hours as needed.      losartan  (COZAAR ) 50 MG tablet Take  50 mg by mouth at bedtime.      pantoprazole  (PROTONIX ) 40 MG tablet Take 1 tablet (40 mg total) by mouth daily before breakfast. 30 tablet 0    propranolol  (INDERAL ) 10 MG tablet TAKE 1 TABLET BY MOUTH EVERY 12 HOURS AS NEEDED. 180 tablet 0    topiramate  (TOPAMAX ) 50 MG tablet TAKE 1 TABLET BY MOUTH EVERY DAY 90 tablet 1       Exam: Current vital signs: BP (!) 190/98 (BP Location: Right Arm)   Pulse (!) 54   Temp 98 F (36.7 C) (Axillary)   Resp 20   Ht 5' 7 (1.702 m)   Wt 69 kg   LMP 04/07/2020 (Within Months)   SpO2 100%   BMI 23.82 kg/m  Vital signs in last 24 hours: Temp:  [97.3 F (36.3 C)-98 F (36.7 C)] 98 F (36.7 C) (07/01 1015) Pulse Rate:  [48-56] 54 (07/01 1030) Resp:  [16-29] 20 (07/01 1030) BP: (147-209)/(79-149) 190/98 (07/01 1030) SpO2:  [96 %-100 %] 100 % (07/01 1030) Weight:  [68.9 kg-69 kg] 69 kg (07/01 1015)   Physical Exam  Constitutional: Laying in bed, not in apparent distress Neuro: Briefly opens eyes and looks at her father when he is speaking to her, but does not track examiner, does not follow commands, does not answer orientation questions, pupils are equally round and sluggishly reacting to light, appears to have right gaze nystagmus but no forced gaze deviation, no apparent facial asymmetry, antigravity strength in all 4 extremities with intermittent nonrhythmic twitching of right upper extremity  I have reviewed labs in epic and the results pertinent to this consultation are: CBC:  Recent Labs  Lab 05/31/24 0342  WBC 7.1  NEUTROABS 5.9  HGB 8.0*  HCT 25.2*  MCV 94.7  PLT 142*    Basic Metabolic Panel:  Lab Results  Component Value Date   NA 140 05/31/2024   K 6.1 (H) 05/31/2024   CO2 24 05/31/2024   GLUCOSE 119 (H) 05/31/2024   BUN 59 (H) 05/31/2024   CREATININE 11.81 (H) 05/31/2024   CALCIUM  7.9 (L) 05/31/2024   GFRNONAA 4 (L) 05/31/2024   GFRAA >60 08/07/2020   Lipid Panel:  Lab Results  Component Value Date   LDLCALC  113 (H) 08/27/2023   HgbA1c:  Lab Results  Component Value Date   HGBA1C 4.9 08/27/2023   Urine Drug Screen:     Component Value Date/Time   LABOPIA NONE DETECTED 11/08/2020 1640   COCAINSCRNUR NONE DETECTED 11/08/2020 1640   LABBENZ NONE DETECTED 11/08/2020 1640   AMPHETMU NONE DETECTED 11/08/2020 1640   THCU POSITIVE (A) 11/08/2020 1640   LABBARB NONE DETECTED 11/08/2020 1640    Alcohol Level     Component Value Date/Time   [facility] <15 05/31/2024 0342     I have reviewed the images obtained: CT head  without contrast 05/31/2024: No acute abnormality. Encephalomalacia in the left frontal lobe laterally is noted consistent with prior ischemia.  ASSESSMENT/PLAN:  43 year old female with multiple medical comorbidities including end-stage renal disease who missed her dialysis on Monday morning and subsequently came after seizure-like episode.   Seizure like episode Hyperammonemia End-stage renal disease - Patient CT showed encephalomalacia in left frontal lobe which does increase her risk of seizures specially in the setting of missed dialysis  Recommendations: - Recommend giving Keppra 1000 mg once.  Will decide maintenance doses based on MRI and EEG findings - Recommend routine EEG to assess for epileptogenicity Plan recommend MRI brain without contrast to look for any acute abnormality - On my current exam, patient was altered but still movements in right upper extremity did not seem consistent with focal motor seizure.  Therefore hold off on treating right upper extremity jerking with benzodiazepines for now - If unable to get EEG today or patient's mental status worsens or has any further clinical seizures, would recommend transfer to [facility] for long-term EEG -Management of end-stage renal disease and hyperammonemia per primary team - Discussed plan with patient's father at bedside as well as Dr. Evonnie via secure chat   Thank you for allowing us  to participate in the  care of this patient. If you have any further questions, please contact  me or neurohospitalist.   Arlin Krebs [facility]  neurohospitalist

## 2024-05-31 NOTE — TOC Initial Note (Signed)
 Transition of Care [facility]) - Initial/Assessment Note    Patient Details  Name: Cathy [name] MRN: [id number] Date of Birth: Jul 24, 1981  Transition of Care [facility]) CM/SW Contact:    Sharlyne Stabs, RN Phone Number: 05/31/2024, 1:11 PM  Clinical Narrative:    CM at the bedside. Patient sleeping, HD treatment is process with RN. Mother at the bedside. Patient still confused, she missed HD yesterday due to her car breaking down.  Patient did change HD clinics. She is now with [facility] MWF. She drives herself to HD. No new needs per mother. TOC following.               Expected Discharge Plan: Home/Self Care Barriers to Discharge: Continued Medical Work up   Patient Goals and CMS Choice Patient states their goals for this hospitalization and ongoing recovery are:: Return home CMS Medicare.gov Compare Post Acute Care list provided to:: Patient        Expected Discharge Plan and Services       Living arrangements for the past 2 months: Single Family Home      Prior Living Arrangements/Services Living arrangements for the past 2 months: Single Family Home Lives with:: Parents Patient language and need for interpreter reviewed:: Yes        Need for Family Participation in Patient Care: Yes (Comment) Care giver support system in place?: Yes (comment)   Criminal Activity/Legal Involvement Pertinent to Current Situation/Hospitalization: No - Comment as needed  Activities of Daily Living   ADL Screening (condition at time of admission) Independently performs ADLs?: Yes (appropriate for developmental age) Is the patient deaf or have difficulty hearing?: No Does the patient have difficulty seeing, even when wearing glasses/contacts?: No Does the patient have difficulty concentrating, remembering, or making decisions?: No  Permission Sought/Granted   Emotional Assessment     Affect (typically observed): Unable to Assess   Alcohol / Substance Use: Not Applicable    Admission  diagnosis:  Uremia [N19] ESRD on hemodialysis (HCC) [N18.6, Z99.2] History of cirrhosis of liver [Z87.19] Altered mental status, unspecified altered mental status type [R41.82] Acute metabolic encephalopathy [G93.41] Patient Active Problem List   Diagnosis Date Noted   Acute metabolic encephalopathy 05/31/2024   Hyperammonemia (HCC) 05/31/2024   NASH (nonalcoholic steatohepatitis) 05/31/2024   Abnormal pleural fluid 05/31/2024   Prolonged QT interval 05/31/2024   Cellulitis 02/20/2024   Chronic migraine w/o aura w/o status migrainosus, not intractable 12/18/2023   Vaginal discharge 12/18/2023   Elevated ferritin 10/19/2023   SLE (systemic lupus erythematosus related syndrome) (HCC) 10/19/2023   Hypertension 08/27/2023   Dyspnea 08/27/2023   Acute on chronic clinical systolic heart failure (HCC) 10/27/2022   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 cor pulmonale (HCC) 10/27/2022   Gastroesophageal reflux disease    Dysphagia    Abnormal MRI, liver    Cirrhosis of liver with ascites (HCC)    Acute hyperkalemia 06/06/2022   Abdominal pain 03/17/2022   Hypothyroidism, unspecified 03/05/2022   Ascites 03/02/2022   Volume overload 03/01/2022   Tobacco use 01/29/2022   Pulmonary embolism (HCC) 01/09/2022   History of pulmonary embolus 01/07/2022   Malignant hypertension 01/06/2022   Anemia due to chronic kidney disease 12/28/2021   Acute on chronic combined systolic and diastolic CHF (congestive heart failure) (HCC) 12/27/2021   Benign essential HTN 06/08/2021   HFrEF (heart failure with reduced ejection fraction) (HCC)    Nephrotic syndrome with focal and segmental glomerular lesions 03/12/2021   Encounter for antineoplastic chemotherapy 01/07/2021  ESRD on hemodialysis (HCC) 12/23/2020   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   Anxiety and depression 12/14/2020   Iron  deficiency anemia, unspecified 12/14/2020   Systemic lupus erythematosus, unspecified (HCC) 12/14/2020   Secondary  hyperparathyroidism of renal origin (HCC) 12/14/2020   Pericardial effusion-chronic     Interstitial edema 11/07/2020   Pelvic adhesive disease 02/03/2019   Renal hypertension 02/02/2019   Non compliance w medication regimen 01/12/2019   Anemia 10/12/2018   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 [place] [address] Phone: [phone] Fax: [phone]     Social Drivers of Health (SDOH) Social History: SDOH Screenings   Food Insecurity: Patient Unable To Answer (05/31/2024)  Housing: Unknown (05/31/2024)  Transportation Needs: No Transportation Needs (05/31/2024)  Utilities: Not At Risk (05/31/2024)  Alcohol Screen: Low Risk  (02/18/2024)  Depression (PHQ2-9): Medium Risk (02/18/2024)  Financial Resource Strain: Low Risk  (02/18/2024)  Physical Activity: Inactive (02/18/2024)  Social Connections: Socially Isolated (02/18/2024)  Stress: Stress Concern Present (02/18/2024)  Tobacco Use: High Risk (05/31/2024)   SDOH Interventions:     Readmission Risk Interventions    05/31/2024    1:09 PM 08/11/2022   12:50 PM 03/18/2022    9:17 AM  Readmission Risk Prevention Plan  Transportation Screening Complete Complete Complete  Medication Review Oceanographer) Complete Complete Complete  PCP or Specialist appointment within 3-5 days of discharge Not Complete    HRI or Home Care Consult Complete Complete Complete  SW Recovery Care/Counseling Consult Complete Complete Complete  Palliative  Care Screening Not Applicable Not Applicable Not Applicable  Skilled Nursing Facility Not Applicable Not Applicable Not Applicable

## 2024-05-31 NOTE — Progress Notes (Signed)
   05/31/24 1340  Spiritual Encounters  Type of Visit Initial  Care provided to: Patient;Family  Conversation partners present during encounter Nurse  Referral source Nurse (RN/NT/LPN)  Reason for visit Urgent spiritual support  OnCall Visit No  Interventions  Spiritual Care Interventions Made Established relationship of care and support;Compassionate presence;Narrative/life review;Encouragement;Prayer  Intervention Outcomes  Outcomes Awareness around self/spiritual resourses;Reduced anxiety   Reason for Visit: Chaplain making rounds on the ICU and received referral from Rn that Pt's family could use support   Description of Visit: Upon arrival in chaplain found Pt in the bed, not responding to voices, and experiencing what appeared to be involuntary muscle contractions and twitching.   Chaplain introduced himself to family and met Pt's father, Cathy [name], and mother, Cathy [name], as well as the [name], Cathy [name].   Pt's father gave brief overview of Pt's health journey and expressed that they as a family are praying and leaning upon their faith to help them in this difficult time.   Chaplain listened with compassion.  The family asked for prayer and chaplain provided.   Chaplain educated family on how to reach out to spiritual care and I also spoke with nursing staff, asking them to call if the situation escalated.   Plan of Care: No further spiritual interventions planned at this time    Chaplain Maude Roll, MDiv  [email or web address] [phone]

## 2024-06-01 DIAGNOSIS — G9341 Metabolic encephalopathy: Secondary | ICD-10-CM | POA: Diagnosis not present

## 2024-06-01 LAB — GLUCOSE, CAPILLARY: Glucose-Capillary: 83 mg/dL (ref 70–99)

## 2024-06-01 LAB — COMPREHENSIVE METABOLIC PANEL WITH GFR
ALT: 13 U/L (ref 0–44)
AST: 20 U/L (ref 15–41)
Albumin: 3.2 g/dL — ABNORMAL LOW (ref 3.5–5.0)
Alkaline Phosphatase: 223 U/L — ABNORMAL HIGH (ref 38–126)
Anion gap: 12 (ref 5–15)
BUN: 38 mg/dL — ABNORMAL HIGH (ref 6–20)
CO2: 27 mmol/L (ref 22–32)
Calcium: 8.6 mg/dL — ABNORMAL LOW (ref 8.9–10.3)
Chloride: 98 mmol/L (ref 98–111)
Creatinine, Ser: 8.15 mg/dL — ABNORMAL HIGH (ref 0.44–1.00)
GFR, Estimated: 6 mL/min — ABNORMAL LOW (ref >60)
Glucose, Bld: 80 mg/dL (ref 70–99)
Potassium: 4.6 mmol/L (ref 3.5–5.1)
Sodium: 137 mmol/L (ref 135–145)
Total Bilirubin: 0.8 mg/dL (ref 0.0–1.2)
Total Protein: 6.8 g/dL (ref 6.5–8.1)

## 2024-06-01 LAB — CBC
HCT: 27.4 % — ABNORMAL LOW (ref 36.0–46.0)
Hemoglobin: 8.3 g/dL — ABNORMAL LOW (ref 12.0–15.0)
MCH: 29.3 pg (ref 26.0–34.0)
MCHC: 30.3 g/dL (ref 30.0–36.0)
MCV: 96.8 fL (ref 80.0–100.0)
Platelets: 123 10*3/uL — ABNORMAL LOW (ref 150–400)
RBC: 2.83 MIL/uL — ABNORMAL LOW (ref 3.87–5.11)
RDW: 16 % — ABNORMAL HIGH (ref 11.5–15.5)
WBC: 5.1 10*3/uL (ref 4.0–10.5)
nRBC: 0 % (ref 0.0–0.2)

## 2024-06-01 LAB — AMMONIA: Ammonia: 23 umol/L (ref 9–35)

## 2024-06-01 LAB — HEPATITIS B SURFACE ANTIBODY, QUANTITATIVE: Hep B S AB Quant (Post): 44.3 m[IU]/mL

## 2024-06-01 LAB — IRON AND TIBC
Iron: 32 ug/dL (ref 28–170)
Saturation Ratios: 12 % (ref 10.4–31.8)
TIBC: 271 ug/dL (ref 250–450)
UIBC: 239 ug/dL

## 2024-06-01 MED ORDER — CLONIDINE HCL 0.2 MG PO TABS
0.3000 mg | ORAL_TABLET | Freq: Three times a day (TID) | ORAL | Status: DC
  Administered 2024-06-01 – 2024-06-03 (×8): 0.3 mg via ORAL
  Filled 2024-06-01 (×8): qty 1

## 2024-06-01 MED ORDER — HYDRALAZINE HCL 20 MG/ML IJ SOLN
10.0000 mg | Freq: Once | INTRAMUSCULAR | Status: AC
  Administered 2024-06-01: 10 mg via INTRAVENOUS
  Filled 2024-06-01: qty 1

## 2024-06-01 MED ORDER — AMLODIPINE BESYLATE 5 MG PO TABS
10.0000 mg | ORAL_TABLET | Freq: Every day | ORAL | Status: DC
  Administered 2024-06-01 – 2024-06-03 (×3): 10 mg via ORAL
  Filled 2024-06-01 (×3): qty 2

## 2024-06-01 MED ORDER — OXYCODONE HCL 5 MG PO TABS: 5.0000 mg | ORAL_TABLET | ORAL | Status: DC | PRN

## 2024-06-01 MED ORDER — LOSARTAN POTASSIUM 50 MG PO TABS
50.0000 mg | ORAL_TABLET | Freq: Every day | ORAL | Status: DC
  Administered 2024-06-01 – 2024-06-02 (×2): 50 mg via ORAL
  Filled 2024-06-01 (×2): qty 1

## 2024-06-01 MED ORDER — HYDRALAZINE HCL 50 MG PO TABS
100.0000 mg | ORAL_TABLET | Freq: Three times a day (TID) | ORAL | Status: DC
  Administered 2024-06-01 – 2024-06-03 (×8): 100 mg via ORAL
  Filled 2024-06-01 (×7): qty 2

## 2024-06-01 MED ORDER — HYDROMORPHONE HCL 1 MG/ML IJ SOLN
0.5000 mg | INTRAMUSCULAR | Status: DC | PRN
  Administered 2024-06-01 – 2024-06-03 (×10): 0.5 mg via INTRAVENOUS
  Filled 2024-06-01 (×10): qty 0.5

## 2024-06-01 MED ORDER — CARVEDILOL 12.5 MG PO TABS
25.0000 mg | ORAL_TABLET | Freq: Two times a day (BID) | ORAL | Status: DC
  Administered 2024-06-01 – 2024-06-02 (×3): 25 mg via ORAL
  Filled 2024-06-01 (×2): qty 2
  Filled 2024-06-01: qty 8
  Filled 2024-06-01: qty 2

## 2024-06-01 MED ORDER — HYDROXYZINE HCL 25 MG PO TABS
25.0000 mg | ORAL_TABLET | Freq: Two times a day (BID) | ORAL | Status: DC | PRN
  Administered 2024-06-01: 25 mg via ORAL
  Filled 2024-06-01: qty 1

## 2024-06-01 MED ORDER — PANTOPRAZOLE SODIUM 40 MG PO TBEC
40.0000 mg | DELAYED_RELEASE_TABLET | Freq: Every day | ORAL | Status: DC
  Administered 2024-06-01 – 2024-06-03 (×3): 40 mg via ORAL
  Filled 2024-06-01 (×3): qty 1

## 2024-06-01 NOTE — Progress Notes (Signed)
 Patient ID: Cathy [name], female   DOB: 09/09/1981, 43 y.o.   MRN: [id number] S: A little more awake this morning but remains somnolent and falls back to sleep easily. O:BP (!) 141/78   Pulse (!) 57   Temp 99.7 F (37.6 C)   Resp 19   Ht 5' 7 (1.702 m)   Wt 65.5 kg   LMP 04/07/2020 (Within Months)   SpO2 93%   BMI 22.62 kg/m   Intake/Output Summary (Last 24 hours) at 06/01/2024 0917 Last data filed at 06/01/2024 0700 Gross per 24 hour  Intake 240 ml  Output 3500 ml  Net -3260 ml   Intake/Output: I/O last 3 completed shifts: In: 240 [P.O.:240] Out: 3500 [Other:3500]  Intake/Output this shift:  No intake/output data recorded. Weight change:  Gen: NAD CVS: bradycardic at 57 Resp: CTA Abd: distended, +BS, soft, NT Ext: no edema, LUE AVF +T/B  Recent Labs  Lab 05/31/24 0342 06/01/24 0453  NA 140 137  K 6.1* 4.6  CL 98 98  CO2 24 27  GLUCOSE 119* 80  BUN 59* 38*  CREATININE 11.81* 8.15*  ALBUMIN  3.1* 3.2*  CALCIUM  7.9* 8.6*  PHOS 8.7*  --   AST 21 20  ALT 13 13   Liver Function Tests: Recent Labs  Lab 05/31/24 0342 06/01/24 0453  AST 21 20  ALT 13 13  ALKPHOS 227* 223*  BILITOT 0.7 0.8  PROT 6.6 6.8  ALBUMIN  3.1* 3.2*   No results for input(s): LIPASE, AMYLASE in the last 168 hours. Recent Labs  Lab 05/31/24 0342 06/01/24 0453  AMMONIA 42* 23   CBC: Recent Labs  Lab 05/31/24 0342 06/01/24 0453  WBC 7.1 5.1  NEUTROABS 5.9  --   HGB 8.0* 8.3*  HCT 25.2* 27.4*  MCV 94.7 96.8  PLT 142* 123*   Cardiac Enzymes: No results for input(s): CKTOTAL, CKMB, CKMBINDEX, TROPONINI in the last 168 hours. CBG: Recent Labs  Lab 05/31/24 0409 06/01/24 0431  GLUCAP 142* 83    Iron  Studies: No results for input(s): IRON , TIBC, TRANSFERRIN, FERRITIN in the last 72 hours. Studies/Results: EEG adult Result Date: 05/31/2024 Cathy Arlin KIDD, MD     05/31/2024  5:57 PM Patient Name: Cathy [name] MRN: [id number] Epilepsy Attending:  Arlin [name] Cathy Referring Physician/Provider: Evonnie Lenis, MD Date: 05/31/2024 Duration: 23.27 mins Patient history: 43 year old female with multiple medical comorbidities including end-stage renal disease who missed her dialysis on Monday morning and subsequently came after seizure-like episode. EEG to evaluate for seizure. Level of alertness: Awake AEDs during EEG study: LEV Technical aspects: This EEG study was done with scalp electrodes positioned according to the 10-20 International system of electrode placement. Electrical activity was reviewed with band pass filter of 1-70Hz , sensitivity of 7 uV/mm, display speed of 28mm/sec with a 60Hz  notched filter applied as appropriate. EEG data were recorded continuously and digitally stored.  Video monitoring was available and reviewed as appropriate. Description:  EEG showed continuous generalized predominantly 5 to 6 Hz theta slowing admixed with intermittent 2-3hz  delta slowing, at times with triphasic morphology. Hyperventilation and photic stimulation were not performed.    ABNORMALITY - Continuous slow, generalized  IMPRESSION: This study is suggestive of moderate diffuse encephalopathy. No seizures or epileptiform discharges were seen throughout the recording.  Arlin [name] Cathy   DG Chest Port 1 View Result Date: 05/31/2024 CLINICAL DATA:  43 year old female with altered mental status, end stage renal disease. EXAM: PORTABLE CHEST 1 VIEW COMPARISON:  Chest radiographs 03/02/2024  and earlier. FINDINGS: Portable AP upright view at 0353 hours. Stable left chest dual lumen dialysis type catheter. Mildly lower lung volumes. Stable cardiomegaly and mediastinal contours. Visualized tracheal air column is within normal limits. Veiling opacity along the right minor fissure has mildly increased, pleural fluid tracking in the right lung fissures on the previous exam. No superimposed pneumothorax. Bilateral pulmonary vascularity appears increased. Stable left lung base  ventilation, no pleural effusion identified previously. Paucity of bowel gas in the upper abdomen. No acute osseous abnormality identified. IMPRESSION: 1. Chronic cardiomegaly with increased pulmonary vascular congestion. Consider mild or developing interstitial edema. 2. Pleural fluid chronically tracking in the right lung fissures, stable to mildly increased since April. Electronically Signed   By: VEAR Hurst M.D.   On: 05/31/2024 04:16   CT Head Wo Contrast Result Date: 05/31/2024 CLINICAL DATA:  Altered mental status EXAM: CT HEAD WITHOUT CONTRAST TECHNIQUE: Contiguous axial images were obtained from the base of the skull through the vertex without intravenous contrast. RADIATION DOSE REDUCTION: This exam was performed according to the departmental dose-optimization program which includes automated exposure control, adjustment of the mA and/or kV according to patient size and/or use of iterative reconstruction technique. COMPARISON:  09/01/2016 FINDINGS: Brain: No evidence of acute infarction, hemorrhage, hydrocephalus, extra-axial collection or mass lesion/mass effect. Encephalomalacia in the left frontal lobe laterally is noted consistent with prior ischemia. Vascular: No hyperdense vessel or unexpected calcification. Skull: Normal. Negative for fracture or focal lesion. Sinuses/Orbits: No acute finding. Other: None. IMPRESSION: No acute intracranial abnormality noted. Electronically Signed   By: Oneil Devonshire M.D.   On: 05/31/2024 04:04    amLODipine   10 mg Oral Daily   carvedilol   25 mg Oral BID WC   Chlorhexidine  Gluconate Cloth  6 each Topical Daily   Chlorhexidine  Gluconate Cloth  6 each Topical Q0600   cloNIDine   0.3 mg Oral TID   heparin   5,000 Units Subcutaneous Q8H   hydrALAZINE   100 mg Oral TID   lactulose   300 mL Rectal Once   losartan   50 mg Oral QHS   pantoprazole   40 mg Oral QAC breakfast   sodium zirconium cyclosilicate   10 g Oral Once    BMET    Component Value Date/Time   NA  137 06/01/2024 0453   NA 136 12/10/2018 1142   K 4.6 06/01/2024 0453   CL 98 06/01/2024 0453   CO2 27 06/01/2024 0453   GLUCOSE 80 06/01/2024 0453   BUN 38 (H) 06/01/2024 0453   BUN 9 12/10/2018 1142   CREATININE 8.15 (H) 06/01/2024 0453   CALCIUM  8.6 (L) 06/01/2024 0453   GFRNONAA 6 (L) 06/01/2024 0453   GFRAA >60 08/07/2020 1209   CBC    Component Value Date/Time   WBC 5.1 06/01/2024 0453   RBC 2.83 (L) 06/01/2024 0453   HGB 8.3 (L) 06/01/2024 0453   HGB 11.0 (L) 08/27/2023 1004   HCT 27.4 (L) 06/01/2024 0453   HCT 35.1 08/27/2023 1004   PLT 123 (L) 06/01/2024 0453   PLT 217 08/27/2023 1004   MCV 96.8 06/01/2024 0453   MCV 97 08/27/2023 1004   MCH 29.3 06/01/2024 0453   MCHC 30.3 06/01/2024 0453   RDW 16.0 (H) 06/01/2024 0453   RDW 15.2 08/27/2023 1004   LYMPHSABS 0.4 (L) 05/31/2024 0342   LYMPHSABS 1.1 08/27/2023 1004   MONOABS 0.5 05/31/2024 0342   EOSABS 0.2 05/31/2024 0342   EOSABS 0.2 08/27/2023 1004   BASOSABS 0.0 05/31/2024 0342  BASOSABS 0.0 08/27/2023 1004    Dialysis Orders: Center:  [facility]  on MWF . EDW 62.5kg HD Bath 2K/2Ca  Time 4:00 Heparin  500 bolus then 500 units/hr. Access LIJ TDC and LUE AVF BFR 400 DFR 600        Assessment/Plan:  Acute metabolic encephalopathy - possible seizure and post-ictal state.  EEG with moderate diffuse encephalopathy but no seizure activity. Doubt missing one HD session could cause this, especially with a BUN of only 59.  Further workup per primary svc.  Should also send tox screen.  Mild improvement this morning.  ESRD -  missed HD on Monday.  Tolerated HD yesterday with UF of 3.5 L.  Plan for HD again tomorrow and UF as tolerated.  She is still 3.5 L above edw.  Will get back on MWF schedule next week.  Hyperkalemia - will use low K bath and follow response.  Improved with HD  Hypertension/volume  -  UF with HD as above.  Bp improved.  Anemia  -  will check iron  stores and resume ESA.  Transfuse for Hgb <7   Metabolic bone disease -   npo for now  Nutrition -  npo for now  NASH cirrhosis with elevated ammonia - continue lactulose   Acute on chronic combined systolic and diastolic CHF - UF with HD  Fairy RONAL Sellar, MD [facility]

## 2024-06-01 NOTE — Progress Notes (Signed)
 PROGRESS NOTE    Cathy [name]  FMW:[id number] DOB: Dec 29, 1980 DOA: 05/31/2024 PCP: Terry Wilhelmena Lloyd Hilario, FNP   Brief Narrative:    43 y.o. female with medical history significant of ESRD on HD (MWF), prior PE, NASH, systolic and diastolic CHF, hypertension, and SLE who presented to the ED from home via EMS due to altered mental status which started shortly PTA.  Patient has been admitted for evaluation of acute metabolic encephalopathy.  EEG with no abnormal findings on CT brain imaging within normal limits.  Brain MRI ordered and pending.  Mother at bedside states that she appears to overall be doing better from a mentation standpoint.  Assessment & Plan:   Principal Problem:   Acute metabolic encephalopathy Active Problems:   ESRD on hemodialysis (HCC)   Acute on chronic combined systolic and diastolic CHF (congestive heart failure) (HCC)   Cirrhosis of liver with ascites (HCC)   Interstitial edema   Acute hyperkalemia   Hyperammonemia (HCC)   NASH (nonalcoholic steatohepatitis)   Abnormal pleural fluid   Prolonged QT interval  Assessment and Plan:   Acute metabolic encephalopathy -Likely multifactorial including missing dialysis on 05/30/2024, possible seizure, elevated ammonia -According to the patient's family, she has been compliant with dialysis open till missing dialysis on 05/30/2024 which was her usual dialysis day - EEG with no signs to suggest seizure activity - B12 221 - TSH 0.946 - CT brain negative, brain MRI pending - Neurology consultation appreciated -Check UA -Okay for transfer to telemetry   ESRD - Nephrology consulted for dialysis - She dialyzes Monday, Wednesday, Friday   Hyperammonemia-resolved - Ammonia 42>23 - Continue to intermittently monitor depending on patient's overall status -Continues on lactulose    Fluid overload - Management per dialysis   Acute on chronic HFrEF - 01/30/22 echo EF 35-40%, grade 3 DD, mild decreased RVF -  Chest x-ray with vascular congestion - Fluid management per dialysis   Liver Cirrhosis -follows [facility] -felt to be possible multifactorial including lupus, NASH   Essential hypertension-uncontrolled - Resume multiple home medications to include amlodipine , hydralazine , losartan , Coreg , and clonidine  -IV hydralazine  for significant elevations    DVT prophylaxis:Heparin  Code Status: Full Family Communication: Mother at bedside 7/2 Disposition Plan:  Status is: Inpatient Remains inpatient appropriate because: Need for IV medications and close monitoring   Consultants:  None  Procedures:  None  Antimicrobials:  None   Subjective: Patient seen and evaluated today with complaints of some itching and pain this morning.  Mentation appears improved and patient cannot recognize mother at bedside.  Tolerated hemodialysis well yesterday, though she does not remember.  Objective: Vitals:   06/01/24 0800 06/01/24 0819 06/01/24 0900 06/01/24 1000  BP: (!) 164/68 (!) 164/68 (!) 141/78 129/80  Pulse:   (!) 57 (!) 54  Resp: 16  19 14   Temp:      TempSrc:      SpO2: 92% 95% 93% 95%  Weight:      Height:        Intake/Output Summary (Last 24 hours) at 06/01/2024 1106 Last data filed at 06/01/2024 0700 Gross per 24 hour  Intake 240 ml  Output 3500 ml  Net -3260 ml   Filed Weights   05/31/24 0932 05/31/24 1015 05/31/24 1343  Weight: 68.9 kg 69 kg 65.5 kg    Examination:  General exam: Appears calm and comfortable  Respiratory system: Clear to auscultation. Respiratory effort normal. Cardiovascular system: S1 & S2 heard, RRR.  Gastrointestinal system: Abdomen is  soft Central nervous system: Alert and awake Extremities: No edema Skin: No significant lesions noted Psychiatry: Flat affect.    Data Reviewed: I have personally reviewed following labs and imaging studies  CBC: Recent Labs  Lab 05/31/24 0342 06/01/24 0453  WBC 7.1 5.1  NEUTROABS 5.9  --   HGB 8.0*  8.3*  HCT 25.2* 27.4*  MCV 94.7 96.8  PLT 142* 123*   Basic Metabolic Panel: Recent Labs  Lab 05/31/24 0342 06/01/24 0453  NA 140 137  K 6.1* 4.6  CL 98 98  CO2 24 27  GLUCOSE 119* 80  BUN 59* 38*  CREATININE 11.81* 8.15*  CALCIUM  7.9* 8.6*  MG 2.1  --   PHOS 8.7*  --    GFR: Estimated Creatinine Clearance: 8.9 mL/min (A) (by C-G formula based on SCr of 8.15 mg/dL (H)). Liver Function Tests: Recent Labs  Lab 05/31/24 0342 06/01/24 0453  AST 21 20  ALT 13 13  ALKPHOS 227* 223*  BILITOT 0.7 0.8  PROT 6.6 6.8  ALBUMIN  3.1* 3.2*   No results for input(s): LIPASE, AMYLASE in the last 168 hours. Recent Labs  Lab 05/31/24 0342 06/01/24 0453  AMMONIA 42* 23   Coagulation Profile: Recent Labs  Lab 05/31/24 0342  INR 1.1   Cardiac Enzymes: No results for input(s): CKTOTAL, CKMB, CKMBINDEX, TROPONINI in the last 168 hours. BNP (last 3 results) No results for input(s): PROBNP in the last 8760 hours. HbA1C: No results for input(s): HGBA1C in the last 72 hours. CBG: Recent Labs  Lab 05/31/24 0409 06/01/24 0431  GLUCAP 142* 83   Lipid Profile: No results for input(s): CHOL, HDL, LDLCALC, TRIG, CHOLHDL, LDLDIRECT in the last 72 hours. Thyroid Function Tests: Recent Labs    05/31/24 0905  TSH 0.946  FREET4 0.84   Anemia Panel: Recent Labs    05/31/24 0905 06/01/24 0453  VITAMINB12 221  --   FOLATE 7.8  --   TIBC  --  271  IRON   --  32   Sepsis Labs: No results for input(s): PROCALCITON, LATICACIDVEN in the last 168 hours.  Recent Results (from the past 240 hours)  MRSA Next Gen by PCR, Nasal     Status: None   Collection Time: 05/31/24  7:22 AM   Specimen: Nasal Mucosa; Nasal Swab  Result Value Ref Range Status   MRSA by PCR Next Gen NOT DETECTED NOT DETECTED Final    Comment: (NOTE) The GeneXpert MRSA Assay (FDA approved for NASAL specimens only), is one component of a comprehensive MRSA colonization  surveillance program. It is not intended to diagnose MRSA infection nor to guide or monitor treatment for MRSA infections. Test performance is not FDA approved in patients less than 38 years old. Performed at [facility], [address], [place], [place] [address]          Radiology Studies: EEG adult Result Date: 05/31/2024 Shelton Arlin KIDD, MD     05/31/2024  5:57 PM Patient Name: ALVAH GILDER MRN: [id number] Epilepsy Attending: Arlin KIDD Shelton Referring Physician/Provider: Evonnie Lenis, MD Date: 05/31/2024 Duration: 23.27 mins Patient history: 43 year old female with multiple medical comorbidities including end-stage renal disease who missed her dialysis on Monday morning and subsequently came after seizure-like episode. EEG to evaluate for seizure. Level of alertness: Awake AEDs during EEG study: LEV Technical aspects: This EEG study was done with scalp electrodes positioned according to the 10-20 International system of electrode placement. Electrical activity was reviewed with band pass filter of 1-70Hz ,  sensitivity of 7 uV/mm, display speed of 81mm/sec with a 60Hz  notched filter applied as appropriate. EEG data were recorded continuously and digitally stored.  Video monitoring was available and reviewed as appropriate. Description:  EEG showed continuous generalized predominantly 5 to 6 Hz theta slowing admixed with intermittent 2-3hz  delta slowing, at times with triphasic morphology. Hyperventilation and photic stimulation were not performed.    ABNORMALITY - Continuous slow, generalized  IMPRESSION: This study is suggestive of moderate diffuse encephalopathy. No seizures or epileptiform discharges were seen throughout the recording.  Arlin MALVA Krebs   DG Chest Port 1 View Result Date: 05/31/2024 CLINICAL DATA:  43 year old female with altered mental status, end stage renal disease. EXAM: PORTABLE CHEST 1 VIEW COMPARISON:  Chest radiographs 03/02/2024 and earlier. FINDINGS: Portable AP upright  view at 0353 hours. Stable left chest dual lumen dialysis type catheter. Mildly lower lung volumes. Stable cardiomegaly and mediastinal contours. Visualized tracheal air column is within normal limits. Veiling opacity along the right minor fissure has mildly increased, pleural fluid tracking in the right lung fissures on the previous exam. No superimposed pneumothorax. Bilateral pulmonary vascularity appears increased. Stable left lung base ventilation, no pleural effusion identified previously. Paucity of bowel gas in the upper abdomen. No acute osseous abnormality identified. IMPRESSION: 1. Chronic cardiomegaly with increased pulmonary vascular congestion. Consider mild or developing interstitial edema. 2. Pleural fluid chronically tracking in the right lung fissures, stable to mildly increased since April. Electronically Signed   By: VEAR Hurst M.D.   On: 05/31/2024 04:16   CT Head Wo Contrast Result Date: 05/31/2024 CLINICAL DATA:  Altered mental status EXAM: CT HEAD WITHOUT CONTRAST TECHNIQUE: Contiguous axial images were obtained from the base of the skull through the vertex without intravenous contrast. RADIATION DOSE REDUCTION: This exam was performed according to the departmental dose-optimization program which includes automated exposure control, adjustment of the mA and/or kV according to patient size and/or use of iterative reconstruction technique. COMPARISON:  09/01/2016 FINDINGS: Brain: No evidence of acute infarction, hemorrhage, hydrocephalus, extra-axial collection or mass lesion/mass effect. Encephalomalacia in the left frontal lobe laterally is noted consistent with prior ischemia. Vascular: No hyperdense vessel or unexpected calcification. Skull: Normal. Negative for fracture or focal lesion. Sinuses/Orbits: No acute finding. Other: None. IMPRESSION: No acute intracranial abnormality noted. Electronically Signed   By: Oneil Devonshire M.D.   On: 05/31/2024 04:04        Scheduled Meds:   amLODipine   10 mg Oral Daily   carvedilol   25 mg Oral BID WC   Chlorhexidine  Gluconate Cloth  6 each Topical Daily   Chlorhexidine  Gluconate Cloth  6 each Topical Q0600   cloNIDine   0.3 mg Oral TID   heparin   5,000 Units Subcutaneous Q8H   hydrALAZINE   100 mg Oral TID   lactulose   300 mL Rectal Once   losartan   50 mg Oral QHS   pantoprazole   40 mg Oral QAC breakfast   sodium zirconium cyclosilicate   10 g Oral Once   Continuous Infusions:  albumin  human       LOS: 1 day    Time spent: 55 minutes    Cathy Droz D Maree, DO [facility]  If 7PM-7AM, please contact night-coverage [email or web address] 06/01/2024, 11:06 AM

## 2024-06-01 NOTE — Plan of Care (Signed)

## 2024-06-02 ENCOUNTER — Other Ambulatory Visit: Payer: Self-pay | Admitting: Family Medicine

## 2024-06-02 ENCOUNTER — Inpatient Hospital Stay (HOSPITAL_COMMUNITY)

## 2024-06-02 DIAGNOSIS — G9341 Metabolic encephalopathy: Secondary | ICD-10-CM | POA: Diagnosis not present

## 2024-06-02 LAB — CBC
HCT: 22.6 % — ABNORMAL LOW (ref 36.0–46.0)
Hemoglobin: 7.1 g/dL — ABNORMAL LOW (ref 12.0–15.0)
MCH: 30.1 pg (ref 26.0–34.0)
MCHC: 31.4 g/dL (ref 30.0–36.0)
MCV: 95.8 fL (ref 80.0–100.0)
Platelets: 117 10*3/uL — ABNORMAL LOW (ref 150–400)
RBC: 2.36 MIL/uL — ABNORMAL LOW (ref 3.87–5.11)
RDW: 16.3 % — ABNORMAL HIGH (ref 11.5–15.5)
WBC: 4.7 10*3/uL (ref 4.0–10.5)
nRBC: 0 % (ref 0.0–0.2)

## 2024-06-02 LAB — COMPREHENSIVE METABOLIC PANEL WITH GFR
ALT: 11 U/L (ref 0–44)
AST: 16 U/L (ref 15–41)
Albumin: 2.8 g/dL — ABNORMAL LOW (ref 3.5–5.0)
Alkaline Phosphatase: 200 U/L — ABNORMAL HIGH (ref 38–126)
Anion gap: 14 (ref 5–15)
BUN: 56 mg/dL — ABNORMAL HIGH (ref 6–20)
CO2: 26 mmol/L (ref 22–32)
Calcium: 8 mg/dL — ABNORMAL LOW (ref 8.9–10.3)
Chloride: 100 mmol/L (ref 98–111)
Creatinine, Ser: 10.49 mg/dL — ABNORMAL HIGH (ref 0.44–1.00)
GFR, Estimated: 4 mL/min — ABNORMAL LOW (ref >60)
Glucose, Bld: 95 mg/dL (ref 70–99)
Potassium: 5 mmol/L (ref 3.5–5.1)
Sodium: 140 mmol/L (ref 135–145)
Total Bilirubin: 0.6 mg/dL (ref 0.0–1.2)
Total Protein: 6.2 g/dL — ABNORMAL LOW (ref 6.5–8.1)

## 2024-06-02 LAB — HEMOGLOBIN AND HEMATOCRIT, BLOOD
HCT: 23.4 % — ABNORMAL LOW (ref 36.0–46.0)
Hemoglobin: 7.2 g/dL — ABNORMAL LOW (ref 12.0–15.0)

## 2024-06-02 LAB — MAGNESIUM: Magnesium: 2.1 mg/dL (ref 1.7–2.4)

## 2024-06-02 LAB — PREPARE RBC (CROSSMATCH)

## 2024-06-02 MED ORDER — HEPARIN SODIUM (PORCINE) 1000 UNIT/ML DIALYSIS
20.0000 [IU]/kg | INTRAMUSCULAR | Status: DC | PRN
  Administered 2024-06-02: 3800 [IU] via INTRAVENOUS_CENTRAL

## 2024-06-02 MED ORDER — ASPIRIN 81 MG PO CHEW
81.0000 mg | CHEWABLE_TABLET | Freq: Every day | ORAL | Status: DC
  Administered 2024-06-02 – 2024-06-03 (×2): 81 mg via ORAL
  Filled 2024-06-02 (×2): qty 1

## 2024-06-02 MED ORDER — LEVETIRACETAM 500 MG PO TABS
500.0000 mg | ORAL_TABLET | Freq: Every day | ORAL | Status: DC
  Administered 2024-06-03 (×2): 500 mg via ORAL
  Filled 2024-06-02 (×2): qty 1

## 2024-06-02 MED ORDER — SODIUM CHLORIDE 0.9% IV SOLUTION: Freq: Once | INTRAVENOUS | Status: AC

## 2024-06-02 MED ORDER — HEPARIN SODIUM (PORCINE) 1000 UNIT/ML IJ SOLN
INTRAMUSCULAR | Status: AC
Start: 2024-06-02 — End: 2024-06-02
  Filled 2024-06-02: qty 5

## 2024-06-02 MED ORDER — DIPHENHYDRAMINE HCL 50 MG/ML IJ SOLN
25.0000 mg | Freq: Once | INTRAMUSCULAR | Status: AC
  Administered 2024-06-02: 25 mg via INTRAVENOUS

## 2024-06-02 MED ORDER — DARBEPOETIN ALFA 60 MCG/0.3ML IJ SOSY
60.0000 ug | PREFILLED_SYRINGE | Freq: Once | INTRAMUSCULAR | Status: DC
  Filled 2024-06-02: qty 0.3

## 2024-06-02 MED ORDER — DIPHENHYDRAMINE HCL 50 MG/ML IJ SOLN
INTRAMUSCULAR | Status: AC
Start: 2024-06-02 — End: 2024-06-02
  Filled 2024-06-02: qty 1

## 2024-06-02 MED ORDER — BUSPIRONE HCL 7.5 MG PO TABS
7.5000 mg | ORAL_TABLET | Freq: Every day | ORAL | 0 refills | Status: DC
Start: 2024-06-02 — End: 2024-08-26

## 2024-06-02 MED ORDER — LEVETIRACETAM 250 MG PO TABS: 250.0000 mg | ORAL_TABLET | ORAL | Status: DC

## 2024-06-02 NOTE — Care Plan (Signed)
 MRI brain showed Focal encephalomalacia changes within the left mid frontal lobe, most likely secondary to focal cortical infarct with associated petechial hemorrhage.  Patient in dialysis so unable to see currently. Will attempt to see if back before 430 pm or see her tomorrow  Recommend ASA 81mg  daily in the meantime. Also recommend maintenance keppra 500mg  daily, additional 250mg  after dialysis due to presentation with seizure in setting of stroke.   Ron Beske O Manraj Yeo

## 2024-06-02 NOTE — Progress Notes (Signed)
   HEMODIALYSIS TREATMENT NOTE:  Uneventful 3.5 hour heparin -free treatment completed using left internal jugular TDC.  Blood transfusion had to be deferred to primary nursing service - pRBCs are coming from an off-campus blood bank and did not arrive before HD ended.  3.7 liters removed without interruption in UF.    Post-HD:  06/02/24 1630  Vitals  Temp 98 F (36.7 C)  Temp Source Oral  BP (!) 111/93  MAP (mmHg) 99  BP Location Right Arm  BP Method Automatic  Patient Position (if appropriate) Lying  Pulse Rate (!) 52  Pulse Rate Source Monitor  ECG Heart Rate (!) 51  Resp 17  Weight 62.1 kg (question d/t sz precaution pads - too weak to stand)  Type of Weight Post-Dialysis  Post Treatment  Dialyzer Clearance Lightly streaked  Hemodialysis Intake (mL) 0 mL  Liters Processed 82.5  Fluid Removed (mL) 3700 mL  Tolerated HD Treatment Yes  Post-Hemodialysis Comments Goal met    Jon Laos, RN AP [facility]

## 2024-06-02 NOTE — Plan of Care (Signed)
   Problem: Clinical Measurements: Goal: Ability to maintain clinical measurements within normal limits will improve Outcome: Progressing Goal: Will remain free from infection Outcome: Progressing

## 2024-06-02 NOTE — Progress Notes (Addendum)
 Patient ID: [name], female   DOB: [birth year], 43 y.o.   MRN: [id number] S: Awake, eating breakfast, answers questions slowly, often times spacing out before answering.  No complaints.  O:BP (!) 154/87   Pulse (!) 45   Temp 97.8 F (36.6 C)   Resp 14   Ht 5' 7 (1.702 m)   Wt 65.5 kg   LMP 04/07/2020 (Within Months)   SpO2 100%   BMI 22.62 kg/m   Intake/Output Summary (Last 24 hours) at 06/02/2024 0920 Last data filed at 06/02/2024 0300 Gross per 24 hour  Intake 480 ml  Output --  Net 480 ml   Intake/Output: I/O last 3 completed shifts: In: 720 [P.O.:720] Out: -   Intake/Output this shift:  No intake/output data recorded. Weight change:  Gen: NAD CVS: bradycardic Resp: CTA Abd: distended, +BS, soft, NT Ext: no edema, LUE AVF +T/B LIJ [facility]  Recent Labs  Lab 05/31/24 0342 06/01/24 0453 06/02/24 0414  NA 140 137 140  K 6.1* 4.6 5.0  CL 98 98 100  CO2 24 27 26   GLUCOSE 119* 80 95  BUN 59* 38* 56*  CREATININE 11.81* 8.15* 10.49*  ALBUMIN  3.1* 3.2* 2.8*  CALCIUM  7.9* 8.6* 8.0*  PHOS 8.7*  --   --   AST 21 20 16   ALT 13 13 11    Liver Function Tests: Recent Labs  Lab 05/31/24 0342 06/01/24 0453 06/02/24 0414  AST 21 20 16   ALT 13 13 11   ALKPHOS 227* 223* 200*  BILITOT 0.7 0.8 0.6  PROT 6.6 6.8 6.2*  ALBUMIN  3.1* 3.2* 2.8*   No results for input(s): LIPASE, AMYLASE in the last 168 hours. Recent Labs  Lab 05/31/24 0342 06/01/24 0453  AMMONIA 42* 23   CBC: Recent Labs  Lab 05/31/24 0342 06/01/24 0453 06/02/24 0414 06/02/24 0814  WBC 7.1 5.1 4.7  --   NEUTROABS 5.9  --   --   --   HGB 8.0* 8.3* 7.1* 7.2*  HCT 25.2* 27.4* 22.6* 23.4*  MCV 94.7 96.8 95.8  --   PLT 142* 123* 117*  --    Cardiac Enzymes: No results for input(s): CKTOTAL, CKMB, CKMBINDEX, TROPONINI in the last 168 hours. CBG: Recent Labs  Lab 05/31/24 0409 06/01/24 0431  GLUCAP 142* 83    Iron  Studies:  Recent Labs    06/01/24 0453  IRON  32  TIBC 271    Studies/Results: EEG adult Result Date: 05/31/2024 Shelton Arlin KIDD, MD     05/31/2024  5:57 PM Patient Name: DANIRA NYLANDER MRN: [id number] Epilepsy Attending: Arlin KIDD Shelton Referring Physician/Provider: Evonnie Lenis, MD Date: 05/31/2024 Duration: 23.27 mins Patient history: 43 year old female with multiple medical comorbidities including end-stage renal disease who missed her dialysis on Monday morning and subsequently came after seizure-like episode. EEG to evaluate for seizure. Level of alertness: Awake AEDs during EEG study: LEV Technical aspects: This EEG study was done with scalp electrodes positioned according to the 10-20 International system of electrode placement. Electrical activity was reviewed with band pass filter of 1-70Hz , sensitivity of 7 uV/mm, display speed of 27mm/sec with a 60Hz  notched filter applied as appropriate. EEG data were recorded continuously and digitally stored.  Video monitoring was available and reviewed as appropriate. Description:  EEG showed continuous generalized predominantly 5 to 6 Hz theta slowing admixed with intermittent 2-3hz  delta slowing, at times with triphasic morphology. Hyperventilation and photic stimulation were not performed.    ABNORMALITY - Continuous slow, generalized  IMPRESSION: This study  is suggestive of moderate diffuse encephalopathy. No seizures or epileptiform discharges were seen throughout the recording.  Priyanka O Yadav    amLODipine   10 mg Oral Daily   carvedilol   25 mg Oral BID WC   Chlorhexidine  Gluconate Cloth  6 each Topical Daily   Chlorhexidine  Gluconate Cloth  6 each Topical Q0600   cloNIDine   0.3 mg Oral TID   hydrALAZINE   100 mg Oral TID   lactulose   300 mL Rectal Once   losartan   50 mg Oral QHS   pantoprazole   40 mg Oral QAC breakfast   sodium zirconium cyclosilicate   10 g Oral Once    BMET    Component Value Date/Time   NA 140 06/02/2024 0414   NA 136 12/10/2018 1142   K 5.0 06/02/2024 0414   CL 100 06/02/2024  0414   CO2 26 06/02/2024 0414   GLUCOSE 95 06/02/2024 0414   BUN 56 (H) 06/02/2024 0414   BUN 9 12/10/2018 1142   CREATININE 10.49 (H) 06/02/2024 0414   CALCIUM  8.0 (L) 06/02/2024 0414   GFRNONAA 4 (L) 06/02/2024 0414   GFRAA >60 08/07/2020 1209   CBC    Component Value Date/Time   WBC 4.7 06/02/2024 0414   RBC 2.36 (L) 06/02/2024 0414   HGB 7.2 (L) 06/02/2024 0814   HGB 11.0 (L) 08/27/2023 1004   HCT 23.4 (L) 06/02/2024 0814   HCT 35.1 08/27/2023 1004   PLT 117 (L) 06/02/2024 0414   PLT 217 08/27/2023 1004   MCV 95.8 06/02/2024 0414   MCV 97 08/27/2023 1004   MCH 30.1 06/02/2024 0414   MCHC 31.4 06/02/2024 0414   RDW 16.3 (H) 06/02/2024 0414   RDW 15.2 08/27/2023 1004   LYMPHSABS 0.4 (L) 05/31/2024 0342   LYMPHSABS 1.1 08/27/2023 1004   MONOABS 0.5 05/31/2024 0342   EOSABS 0.2 05/31/2024 0342   EOSABS 0.2 08/27/2023 1004   BASOSABS 0.0 05/31/2024 0342   BASOSABS 0.0 08/27/2023 1004    Dialysis Orders: Center:  [facility]  on MWF . EDW 62.5kg HD Bath 2K/2Ca  Time 4:00 Heparin  500 bolus then 500 units/hr. Access LIJ TDC and LUE AVF BFR 400 DFR 600        Assessment/Plan:  Acute metabolic encephalopathy - possible seizure and post-ictal state.  EEG with moderate diffuse encephalopathy but no seizure activity. Doubt missing one HD session could cause this, especially with a BUN of only 59.  Further workup per primary svc.   ESRD -  missed HD on Monday.  Tolerated HD yesterday with UF of 3.5 L.  Plan for HD again today 7/3 and UF as tolerated.  She is still  above edw.  Will get back on MWF schedule next week.  Hyperkalemia - Resolved.  2K bath.   Hypertension/volume  -  UF with HD as above.  Bp improved.  Anemia  -discussed with primary service and will give 1 unit with dialysis today.  ESA today.  Metabolic bone disease / hyperphosphatemia-   Cont outpt regimen.   Nutrition -  tol PO at this time, Renal Diet  NASH cirrhosis with elevated ammonia - continue  lactulose  per PCP  Acute on chronic combined systolic and diastolic CHF / Hypervolemia - UF with HD  Bernardino KATHEE Gasman, MD  [facility]

## 2024-06-02 NOTE — Care Management Important Message (Signed)
 Important Message  Patient Details  Name: PINKEY MCJUNKIN MRN: [id number] Date of Birth: [birth year]   Important Message Given:  Yes - Medicare IM     Marley Pakula L Jeni Duling 06/02/2024, 12:16 PM

## 2024-06-02 NOTE — Progress Notes (Signed)
 PROGRESS NOTE    Cathy [name]  FMW:[id number] DOB: [birth year] DOA: 05/31/2024 PCP: Terry Wilhelmena Lloyd Hilario, FNP   Brief Narrative:    43 y.o. female with medical history significant of ESRD on HD (MWF), prior PE, NASH, systolic and diastolic CHF, hypertension, and SLE who presented to the ED from home via EMS due to altered mental status which started shortly PTA.  Patient has been admitted for evaluation of acute metabolic encephalopathy.  EEG with no abnormal findings on CT brain imaging within normal limits.  Brain MRI shows finding of focal encephalomalacia as well as focal cortical infarct associated with petechial hemorrhage.  Neurology recommends aspirin  as well as Keppra for now.  Further evaluation pending.  Assessment & Plan:   Principal Problem:   Acute metabolic encephalopathy Active Problems:   ESRD on hemodialysis (HCC)   Acute on chronic combined systolic and diastolic CHF (congestive heart failure) (HCC)   Cirrhosis of liver with ascites (HCC)   Interstitial edema   Acute hyperkalemia   Hyperammonemia (HCC)   NASH (nonalcoholic steatohepatitis)   Abnormal pleural fluid   Prolonged QT interval  Assessment and Plan:   Acute encephalopathy likely related to focal encephalomalacia with findings of CVA -Likely multifactorial including missing dialysis on 05/30/2024, possible seizure, elevated ammonia -According to the patient's family, she has been compliant with dialysis open till missing dialysis on 05/30/2024 which was her usual dialysis day - EEG with no signs to suggest seizure activity - B12 221 - TSH 0.946 - CT brain negative, brain MRI is altered with above findings 7/3 - Neurology consultation appreciated with recommendations to maintain on aspirin  and Keppra which has been prescribed   ESRD - Nephrology consulted for dialysis - She dialyzes Monday, Wednesday, Friday, currently off schedule  Worsening anemia likely related to CKD -No overt bleeding  noted -Plan to transfuse 1 unit PRBC today -Continue to monitor CBC -ESA per nephrology   Hyperammonemia-resolved - Ammonia 42>23 - Continue to intermittently monitor depending on patient's overall status -Continues on lactulose    Fluid overload - Management per dialysis   Acute on chronic HFrEF - 01/30/22 echo EF 35-40%, grade 3 DD, mild decreased RVF - Chest x-ray with vascular congestion - Fluid management per dialysis   Liver Cirrhosis -follows [facility] -felt to be possible multifactorial including lupus, NASH   Essential hypertension-uncontrolled - Resume multiple home medications to include amlodipine , hydralazine , losartan , Coreg , and clonidine  -IV hydralazine  for significant elevations    DVT prophylaxis:Heparin  Code Status: Full Family Communication: Mother at bedside 7/2 Disposition Plan:  Status is: Inpatient Remains inpatient appropriate because: Need for IV medications and close monitoring   Consultants:  Nephrology Neurology  Procedures:  None  Antimicrobials:  None   Subjective: Patient seen and evaluated today with complaints of some itching and pain this morning.  Mentation appears improved, but she is aching all over.  Plans for hemodialysis today and 1 unit PRBC transfusion given drop in hemoglobin levels.  Objective: Vitals:   06/02/24 1345 06/02/24 1400 06/02/24 1430 06/02/24 1500  BP: 132/73 125/80 133/79 130/75  Pulse:      Resp: 17 12 16 15   Temp:      TempSrc:      SpO2:      Weight:      Height:        Intake/Output Summary (Last 24 hours) at 06/02/2024 1518 Last data filed at 06/02/2024 0300 Gross per 24 hour  Intake 240 ml  Output --  Net  240 ml   Filed Weights   05/31/24 1015 05/31/24 1343 06/02/24 1230  Weight: 69 kg 65.5 kg 66.1 kg    Examination:  General exam: Appears calm and comfortable  Respiratory system: Clear to auscultation. Respiratory effort normal. Cardiovascular system: S1 & S2 heard, RRR.   Gastrointestinal system: Abdomen is soft Central nervous system: Alert and awake Extremities: No edema Skin: No significant lesions noted Psychiatry: Flat affect.    Data Reviewed: I have personally reviewed following labs and imaging studies  CBC: Recent Labs  Lab 05/31/24 0342 06/01/24 0453 06/02/24 0414 06/02/24 0814  WBC 7.1 5.1 4.7  --   NEUTROABS 5.9  --   --   --   HGB 8.0* 8.3* 7.1* 7.2*  HCT 25.2* 27.4* 22.6* 23.4*  MCV 94.7 96.8 95.8  --   PLT 142* 123* 117*  --    Basic Metabolic Panel: Recent Labs  Lab 05/31/24 0342 06/01/24 0453 06/02/24 0414  NA 140 137 140  K 6.1* 4.6 5.0  CL 98 98 100  CO2 24 27 26   GLUCOSE 119* 80 95  BUN 59* 38* 56*  CREATININE 11.81* 8.15* 10.49*  CALCIUM  7.9* 8.6* 8.0*  MG 2.1  --  2.1  PHOS 8.7*  --   --    GFR: Estimated Creatinine Clearance: 6.9 mL/min (A) (by C-G formula based on SCr of 10.49 mg/dL (H)). Liver Function Tests: Recent Labs  Lab 05/31/24 0342 06/01/24 0453 06/02/24 0414  AST 21 20 16   ALT 13 13 11   ALKPHOS 227* 223* 200*  BILITOT 0.7 0.8 0.6  PROT 6.6 6.8 6.2*  ALBUMIN  3.1* 3.2* 2.8*   No results for input(s): LIPASE, AMYLASE in the last 168 hours. Recent Labs  Lab 05/31/24 0342 06/01/24 0453  AMMONIA 42* 23   Coagulation Profile: Recent Labs  Lab 05/31/24 0342  INR 1.1   Cardiac Enzymes: No results for input(s): CKTOTAL, CKMB, CKMBINDEX, TROPONINI in the last 168 hours. BNP (last 3 results) No results for input(s): PROBNP in the last 8760 hours. HbA1C: No results for input(s): HGBA1C in the last 72 hours. CBG: Recent Labs  Lab 05/31/24 0409 06/01/24 0431  GLUCAP 142* 83   Lipid Profile: No results for input(s): CHOL, HDL, LDLCALC, TRIG, CHOLHDL, LDLDIRECT in the last 72 hours. Thyroid Function Tests: Recent Labs    05/31/24 0905  TSH 0.946  FREET4 0.84   Anemia Panel: Recent Labs    05/31/24 0905 06/01/24 0453  VITAMINB12 221  --    FOLATE 7.8  --   TIBC  --  271  IRON   --  32   Sepsis Labs: No results for input(s): PROCALCITON, LATICACIDVEN in the last 168 hours.  Recent Results (from the past 240 hours)  MRSA Next Gen by PCR, Nasal     Status: None   Collection Time: 05/31/24  7:22 AM   Specimen: Nasal Mucosa; Nasal Swab  Result Value Ref Range Status   MRSA by PCR Next Gen NOT DETECTED NOT DETECTED Final    Comment: (NOTE) The GeneXpert MRSA Assay (FDA approved for NASAL specimens only), is one component of a comprehensive MRSA colonization surveillance program. It is not intended to diagnose MRSA infection nor to guide or monitor treatment for MRSA infections. Test performance is not FDA approved in patients less than 34 years old. Performed at [facility], [address] [place], [place] [address]          Radiology Studies: MR BRAIN WO CONTRAST Result Date:  06/02/2024 CLINICAL DATA:  Mental status change, unknown cause Seizure, new-onset, no history of trauma EXAM: MRI HEAD WITHOUT CONTRAST TECHNIQUE: Multiplanar, multiecho pulse sequences of the brain and surrounding structures were obtained without intravenous contrast. COMPARISON:  CT of the head dated May 31, 2024. FINDINGS: Brain: There are focal encephalomalacia changes again demonstrated within the left mid frontal lobe with underlying gliosis, presumably from prior infarct. There is mild blooming artifact within the subcortical white matter suggesting hemosiderin staining. The brain otherwise appears normal. There is no evidence of mass or hydrocephalus. Vascular: Normal vascular flow voids. Skull and upper cervical spine: Normal marrow signal. Sinuses/Orbits: No acute process. Other: None. IMPRESSION: 1. Focal encephalomalacia changes within the left mid frontal lobe, most likely secondary to focal cortical infarct with associated petechial hemorrhage. Electronically Signed   By: Evalene Coho M.D.   On: 06/02/2024 12:34   EEG  adult Result Date: 05/31/2024 Shelton Arlin KIDD, MD     05/31/2024  5:57 PM Patient Name: Cathy [name] MRN: [id number] Epilepsy Attending: Arlin KIDD Shelton Referring Physician/Provider: Evonnie Lenis, MD Date: 05/31/2024 Duration: 23.27 mins Patient history: 43 year old female with multiple medical comorbidities including end-stage renal disease who missed her dialysis on Monday morning and subsequently came after seizure-like episode. EEG to evaluate for seizure. Level of alertness: Awake AEDs during EEG study: LEV Technical aspects: This EEG study was done with scalp electrodes positioned according to the 10-20 International system of electrode placement. Electrical activity was reviewed with band pass filter of 1-70Hz , sensitivity of 7 uV/mm, display speed of 5mm/sec with a 60Hz  notched filter applied as appropriate. EEG data were recorded continuously and digitally stored.  Video monitoring was available and reviewed as appropriate. Description:  EEG showed continuous generalized predominantly 5 to 6 Hz theta slowing admixed with intermittent 2-3hz  delta slowing, at times with triphasic morphology. Hyperventilation and photic stimulation were not performed.    ABNORMALITY - Continuous slow, generalized  IMPRESSION: This study is suggestive of moderate diffuse encephalopathy. No seizures or epileptiform discharges were seen throughout the recording.  Priyanka O Yadav        Scheduled Meds:  amLODipine   10 mg Oral Daily   aspirin   81 mg Oral Daily   carvedilol   25 mg Oral BID WC   Chlorhexidine  Gluconate Cloth  6 each Topical Daily   Chlorhexidine  Gluconate Cloth  6 each Topical Q0600   cloNIDine   0.3 mg Oral TID   darbepoetin (ARANESP ) injection - DIALYSIS  60 mcg Subcutaneous Once   hydrALAZINE   100 mg Oral TID   lactulose   300 mL Rectal Once   levETIRAcetam  500 mg Oral Daily   And   [START ON 06/03/2024] levETIRAcetam  250 mg Oral Q M,W,F-HD   losartan   50 mg Oral QHS   pantoprazole   40 mg Oral  QAC breakfast   sodium zirconium cyclosilicate   10 g Oral Once   Continuous Infusions:  albumin  human       LOS: 2 days    Time spent: 55 minutes    Hendricks Schwandt D Rashidi Loh, DO [facility]  If 7PM-7AM, please contact night-coverage [email or web address] 06/02/2024, 3:18 PM

## 2024-06-02 NOTE — Plan of Care (Signed)
   Problem: Health Behavior/Discharge Planning: Goal: Ability to manage health-related needs will improve Outcome: Progressing

## 2024-06-03 ENCOUNTER — Inpatient Hospital Stay (HOSPITAL_COMMUNITY)

## 2024-06-03 DIAGNOSIS — I639 Cerebral infarction, unspecified: Secondary | ICD-10-CM

## 2024-06-03 DIAGNOSIS — I6389 Other cerebral infarction: Secondary | ICD-10-CM | POA: Diagnosis not present

## 2024-06-03 DIAGNOSIS — R569 Unspecified convulsions: Secondary | ICD-10-CM | POA: Diagnosis not present

## 2024-06-03 DIAGNOSIS — Z992 Dependence on renal dialysis: Secondary | ICD-10-CM | POA: Diagnosis not present

## 2024-06-03 DIAGNOSIS — N186 End stage renal disease: Secondary | ICD-10-CM | POA: Diagnosis not present

## 2024-06-03 LAB — LIPID PANEL
Cholesterol: 201 mg/dL — ABNORMAL HIGH (ref 0–200)
HDL: 32 mg/dL — ABNORMAL LOW (ref >40)
LDL Cholesterol: 139 mg/dL — ABNORMAL HIGH (ref 0–99)
Total CHOL/HDL Ratio: 6.3 ratio
Triglycerides: 148 mg/dL (ref <150)
VLDL: 30 mg/dL (ref 0–40)

## 2024-06-03 LAB — COMPREHENSIVE METABOLIC PANEL WITH GFR
ALT: 12 U/L (ref 0–44)
AST: 16 U/L (ref 15–41)
Albumin: 2.9 g/dL — ABNORMAL LOW (ref 3.5–5.0)
Alkaline Phosphatase: 216 U/L — ABNORMAL HIGH (ref 38–126)
Anion gap: 11 (ref 5–15)
BUN: 36 mg/dL — ABNORMAL HIGH (ref 6–20)
CO2: 28 mmol/L (ref 22–32)
Calcium: 8.2 mg/dL — ABNORMAL LOW (ref 8.9–10.3)
Chloride: 98 mmol/L (ref 98–111)
Creatinine, Ser: 7.36 mg/dL — ABNORMAL HIGH (ref 0.44–1.00)
GFR, Estimated: 7 mL/min — ABNORMAL LOW (ref >60)
Glucose, Bld: 97 mg/dL (ref 70–99)
Potassium: 4.2 mmol/L (ref 3.5–5.1)
Sodium: 137 mmol/L (ref 135–145)
Total Bilirubin: 0.6 mg/dL (ref 0.0–1.2)
Total Protein: 6.5 g/dL (ref 6.5–8.1)

## 2024-06-03 LAB — TYPE AND SCREEN
ABO/RH(D): O POS
Antibody Screen: NEGATIVE
Donor AG Type: NEGATIVE
Unit division: 0

## 2024-06-03 LAB — CBC
HCT: 25.7 % — ABNORMAL LOW (ref 36.0–46.0)
Hemoglobin: 8.3 g/dL — ABNORMAL LOW (ref 12.0–15.0)
MCH: 29.4 pg (ref 26.0–34.0)
MCHC: 32.3 g/dL (ref 30.0–36.0)
MCV: 91.1 fL (ref 80.0–100.0)
Platelets: 119 K/uL — ABNORMAL LOW (ref 150–400)
RBC: 2.82 MIL/uL — ABNORMAL LOW (ref 3.87–5.11)
RDW: 19 % — ABNORMAL HIGH (ref 11.5–15.5)
WBC: 4.6 K/uL (ref 4.0–10.5)
nRBC: 0 % (ref 0.0–0.2)

## 2024-06-03 LAB — ECHOCARDIOGRAM COMPLETE BUBBLE STUDY
AR max vel: 2.72 cm2
AV Area VTI: 2.57 cm2
AV Area mean vel: 2.46 cm2
AV Mean grad: 9 mmHg
AV Peak grad: 19 mmHg
Ao pk vel: 2.18 m/s
Area-P 1/2: 2.71 cm2
S' Lateral: 2.9 cm

## 2024-06-03 LAB — BPAM RBC
Blood Product Expiration Date: 202507222359
ISSUE DATE / TIME: 202507031806
Unit Type and Rh: 5100

## 2024-06-03 LAB — MAGNESIUM: Magnesium: 1.9 mg/dL (ref 1.7–2.4)

## 2024-06-03 LAB — AMMONIA: Ammonia: 32 umol/L (ref 9–35)

## 2024-06-03 MED ORDER — LEVETIRACETAM 250 MG PO TABS: ORAL_TABLET | ORAL | 0 refills | Status: DC

## 2024-06-03 MED ORDER — CARVEDILOL 12.5 MG PO TABS
12.5000 mg | ORAL_TABLET | Freq: Two times a day (BID) | ORAL | Status: DC
  Administered 2024-06-03: 12.5 mg via ORAL
  Filled 2024-06-03: qty 1

## 2024-06-03 MED ORDER — ATORVASTATIN CALCIUM 40 MG PO TABS
40.0000 mg | ORAL_TABLET | Freq: Every day | ORAL | Status: DC
  Administered 2024-06-03: 40 mg via ORAL
  Filled 2024-06-03: qty 1

## 2024-06-03 MED ORDER — CARVEDILOL 12.5 MG PO TABS: 12.5000 mg | ORAL_TABLET | Freq: Two times a day (BID) | ORAL | 2 refills | Status: DC

## 2024-06-03 MED ORDER — CHLORHEXIDINE GLUCONATE CLOTH 2 % EX PADS
6.0000 | MEDICATED_PAD | Freq: Every day | CUTANEOUS | Status: DC
  Administered 2024-06-03: 6 via TOPICAL

## 2024-06-03 MED ORDER — ASPIRIN 81 MG PO CHEW: 81.0000 mg | CHEWABLE_TABLET | Freq: Every day | ORAL | 1 refills | Status: DC

## 2024-06-03 MED ORDER — ATORVASTATIN CALCIUM 40 MG PO TABS: 40.0000 mg | ORAL_TABLET | Freq: Every day | ORAL | 1 refills | Status: DC

## 2024-06-03 MED ORDER — DARBEPOETIN ALFA 60 MCG/0.3ML IJ SOSY
60.0000 ug | PREFILLED_SYRINGE | Freq: Once | INTRAMUSCULAR | Status: AC
  Administered 2024-06-03: 60 ug via SUBCUTANEOUS
  Filled 2024-06-03: qty 0.3

## 2024-06-03 NOTE — Progress Notes (Addendum)
 Patient ID: Cathy [name], female   DOB: Sep 09, 1981, 43 y.o.   MRN: [id number]  Subjective:  Last HD on 7/3 with 3.7 kg UF.  Spoke with primary team and she may be discharged today after PT/OT eval and after her echo is back.  PT/OT is here now about to start their evaluation.  Patient's daughter just turned 5 this Spring.  Per outpatient HD RN her tunneled catheter is to be removed soon and they've been using her AVF  Review of systems:  Patient denies shortness of breath  Had some nausea earlier No chest pain    O:BP 138/74 (BP Location: Right Arm)   Pulse (!) 46   Temp 98.2 F (36.8 C) (Oral)   Resp 15   Ht 5' 7 (1.702 m)   Wt 62.1 kg Comment: question d/t sz precaution pads - too weak to stand  LMP 04/07/2020 (Within Months)   SpO2 100%   BMI 21.44 kg/m   Intake/Output Summary (Last 24 hours) at 06/03/2024 1002 Last data filed at 06/03/2024 0910 Gross per 24 hour  Intake 634 ml  Output 3700 ml  Net -3066 ml   Intake/Output: I/O last 3 completed shifts: In: 814 [P.O.:480; Blood:334] Out: 3700 [Other:3700]  Intake/Output this shift:  Total I/O In: 60 [P.O.:60] Out: -  Weight change:   General adult female in bed in no acute distress HEENT normocephalic atraumatic extraocular movements intact sclera anicteric Neck supple trachea midline Lungs clear to auscultation bilaterally normal work of breathing at rest on room air Heart S1S2 no rub bradycardic Abdomen soft nontender nondistended Extremities no edema  Psych normal mood and affect LUE AVF with bruit and thrill; tunneled catheter in place   Recent Labs  Lab 05/31/24 0342 06/01/24 0453 06/02/24 0414 06/03/24 0413  NA 140 137 140 137  K 6.1* 4.6 5.0 4.2  CL 98 98 100 98  CO2 24 27 26 28   GLUCOSE 119* 80 95 97  BUN 59* 38* 56* 36*  CREATININE 11.81* 8.15* 10.49* 7.36*  ALBUMIN  3.1* 3.2* 2.8* 2.9*  CALCIUM  7.9* 8.6* 8.0* 8.2*  PHOS 8.7*  --   --   --   AST 21 20 16 16   ALT 13 13 11 12    Liver  Function Tests: Recent Labs  Lab 06/01/24 0453 06/02/24 0414 06/03/24 0413  AST 20 16 16   ALT 13 11 12   ALKPHOS 223* 200* 216*  BILITOT 0.8 0.6 0.6  PROT 6.8 6.2* 6.5  ALBUMIN  3.2* 2.8* 2.9*   No results for input(s): LIPASE, AMYLASE in the last 168 hours. Recent Labs  Lab 05/31/24 0342 06/01/24 0453 06/03/24 0413  AMMONIA 42* 23 32   CBC: Recent Labs  Lab 05/31/24 0342 06/01/24 0453 06/02/24 0414 06/02/24 0814 06/03/24 0413  WBC 7.1 5.1 4.7  --  4.6  NEUTROABS 5.9  --   --   --   --   HGB 8.0* 8.3* 7.1* 7.2* 8.3*  HCT 25.2* 27.4* 22.6* 23.4* 25.7*  MCV 94.7 96.8 95.8  --  91.1  PLT 142* 123* 117*  --  119*   Cardiac Enzymes: No results for input(s): CKTOTAL, CKMB, CKMBINDEX, TROPONINI in the last 168 hours. CBG: Recent Labs  Lab 05/31/24 0409 06/01/24 0431  GLUCAP 142* 83    Iron  Studies:  Recent Labs    06/01/24 0453  IRON  32  TIBC 271   Studies/Results: MR BRAIN WO CONTRAST Result Date: 06/02/2024 CLINICAL DATA:  Mental status change, unknown cause Seizure, new-onset,  no history of trauma EXAM: MRI HEAD WITHOUT CONTRAST TECHNIQUE: Multiplanar, multiecho pulse sequences of the brain and surrounding structures were obtained without intravenous contrast. COMPARISON:  CT of the head dated May 31, 2024. FINDINGS: Brain: There are focal encephalomalacia changes again demonstrated within the left mid frontal lobe with underlying gliosis, presumably from prior infarct. There is mild blooming artifact within the subcortical white matter suggesting hemosiderin staining. The brain otherwise appears normal. There is no evidence of mass or hydrocephalus. Vascular: Normal vascular flow voids. Skull and upper cervical spine: Normal marrow signal. Sinuses/Orbits: No acute process. Other: None. IMPRESSION: 1. Focal encephalomalacia changes within the left mid frontal lobe, most likely secondary to focal cortical infarct with associated petechial hemorrhage.  Electronically Signed   By: Cathy [name] M.D.   On: 06/02/2024 12:34    amLODipine   10 mg Oral Daily   aspirin   81 mg Oral Daily   atorvastatin   40 mg Oral Daily   carvedilol   25 mg Oral BID WC   Chlorhexidine  Gluconate Cloth  6 each Topical Daily   Chlorhexidine  Gluconate Cloth  6 each Topical Q0600   cloNIDine   0.3 mg Oral TID   darbepoetin (ARANESP ) injection - DIALYSIS  60 mcg Subcutaneous Once   hydrALAZINE   100 mg Oral TID   lactulose   300 mL Rectal Once   levETIRAcetam   500 mg Oral Daily   And   levETIRAcetam   250 mg Oral Q M,W,F-HD   losartan   50 mg Oral QHS   pantoprazole   40 mg Oral QAC breakfast   sodium zirconium cyclosilicate   10 g Oral Once    BMET    Component Value Date/Time   NA 137 06/03/2024 0413   NA 136 12/10/2018 1142   K 4.2 06/03/2024 0413   CL 98 06/03/2024 0413   CO2 28 06/03/2024 0413   GLUCOSE 97 06/03/2024 0413   BUN 36 (H) 06/03/2024 0413   BUN 9 12/10/2018 1142   CREATININE 7.36 (H) 06/03/2024 0413   CALCIUM  8.2 (L) 06/03/2024 0413   GFRNONAA 7 (L) 06/03/2024 0413   GFRAA >60 08/07/2020 1209   CBC    Component Value Date/Time   WBC 4.6 06/03/2024 0413   RBC 2.82 (L) 06/03/2024 0413   HGB 8.3 (L) 06/03/2024 0413   HGB 11.0 (L) 08/27/2023 1004   HCT 25.7 (L) 06/03/2024 0413   HCT 35.1 08/27/2023 1004   PLT 119 (L) 06/03/2024 0413   PLT 217 08/27/2023 1004   MCV 91.1 06/03/2024 0413   MCV 97 08/27/2023 1004   MCH 29.4 06/03/2024 0413   MCHC 32.3 06/03/2024 0413   RDW 19.0 (H) 06/03/2024 0413   RDW 15.2 08/27/2023 1004   LYMPHSABS 0.4 (L) 05/31/2024 0342   LYMPHSABS 1.1 08/27/2023 1004   MONOABS 0.5 05/31/2024 0342   EOSABS 0.2 05/31/2024 0342   EOSABS 0.2 08/27/2023 1004   BASOSABS 0.0 05/31/2024 0342   BASOSABS 0.0 08/27/2023 1004    Dialysis Orders: Center:  [facility]  on MWF . EDW 62.5kg HD Bath 2K/2Ca  Time 4:00 Heparin  500 bolus then 500 units/hr. Access LIJ TDC and LUE AVF BFR 400 DFR 600         Assessment/Plan:  Acute metabolic encephalopathy - possible seizure and post-ictal state.  EEG with moderate diffuse encephalopathy but no seizure activity. Doubt missing one HD session could cause this, especially with a BUN of only 59.  Further workup per primary svc.   ESRD -  missed HD on Monday.  Has been TTS here this week.   HD tomorrow then back to MWF schedule next week  I have ordered HD here tomorrow in case she is still here but have also arranged for 10 am HD spot outpatient at [facility] on 7/5  I have ordered strict ins/outs  Hyperkalemia - Resolved and in part secondary to HD noncompliance.  2K bath. Renal diet  Hypertension/volume  -- optimize volume status with HD  Anemia of CKD -s/p PRBC's. Reordered ESA for today as aranesp  was ordered for 7/3 but not given  Metabolic bone disease / hyperphosphatemia-   Cont outpt regimen. Renal panel in AM if here  NASH cirrhosis with elevated ammonia - continue lactulose  per PCP  Acute on chronic combined systolic and diastolic CHF / Hypervolemia - optimize volume status with HD  Disposition per primary team   Katheryn JAYSON Saba, MD  [facility] 10:19 AM 06/03/2024   Addendum - note bradycardia - reduced coreg .   Katheryn JAYSON Saba, MD 10:27 AM 06/03/2024

## 2024-06-03 NOTE — Discharge Summary (Signed)
 Physician Discharge Summary  Cathy [name] FMW:[id number] DOB: 06-26-1981 DOA: 05/31/2024  PCP: Terry Wilhelmena Lloyd Hilario, FNP  Admit date: 05/31/2024  Discharge date: 06/03/2024  Admitted From: Home  Disposition: Home  Recommendations for Outpatient Follow-up:  Follow up with PCP in 1-2 weeks Follow-up with neurology in 2-3 months Continue hemodialysis 7/5 and then back to routine schedule MWF Remain on aspirin  81 mg daily as well as atorvastatin  40 mg daily Started on Keppra  500 mg daily as well as additional dose of 250 mg daily MWF after dialysis 30-day cardiac monitor to be delivered for evaluation of paroxysmal atrial fibrillation Remain on BuSpar  7.5 mg daily as prescribed and consider discontinuing use of this medication in the near future Seizure precautions include no driving Continue other medications as noted below  Home Health: PT recommended, but patient refuses  Equipment/Devices: None  Discharge Condition:Stable  CODE STATUS: Full  Diet recommendation: Renal  Brief/Interim Summary: 43 y.o. female with medical history significant of ESRD on HD (MWF), prior PE, NASH, systolic and diastolic CHF, hypertension, and SLE who presented to the ED from home via EMS due to altered mental status which started shortly PTA.  Patient has been admitted for evaluation of acute metabolic encephalopathy.  EEG with no abnormal findings on CT brain imaging within normal limits.  Brain MRI shows finding of focal encephalomalacia as well as focal cortical infarct associated with petechial hemorrhage.  Neurology recommends aspirin , statin, as well as Keppra .  Further evaluation 2D echocardiogram with no PFO or thrombus noted and patient will receive 30-day cardiac monitor in the near future.  Other medication adjustments have been made as noted.  She continues to remain a high risk for readmission and it is uncertain how compliant she is with her home medications.  Her mentation has returned to  baseline and no other acute events or concerns have been noted during the course of this admission.  It is thought that perhaps her 2 separate prescriptions for BuSpar  had also contributed to the problem on top of the stroke that was already noted.  She will remain on low-dose BuSpar  as prescribed to prevent any withdrawal seizures, but it is hoped that she would be weaned off of this medication in the near future.  She has difficulty with medication compliance and remains a high risk for readmission.  Discharge Diagnoses:  Principal Problem:   Acute metabolic encephalopathy Active Problems:   ESRD on hemodialysis (HCC)   Acute on chronic combined systolic and diastolic CHF (congestive heart failure) (HCC)   Cirrhosis of liver with ascites (HCC)   Interstitial edema   Acute hyperkalemia   Hyperammonemia (HCC)   NASH (nonalcoholic steatohepatitis)   Abnormal pleural fluid   Prolonged QT interval  Principal discharge diagnosis: Acute encephalopathy-multifactorial related to focal encephalomalacia/ischemic CVA with first-time seizure.  Discharge Instructions  Discharge Instructions     Ambulatory referral to Neurology   Complete by: As directed    43 year old an appointment is requested in approximately: 8-12 weeks   Diet - low sodium heart healthy   Complete by: As directed    Diet - low sodium heart healthy   Complete by: As directed    Increase activity slowly   Complete by: As directed    Increase activity slowly   Complete by: As directed       Allergies as of 06/03/2024       Reactions   Dialyvite 800 [nephro-vite] Nausea And Vomiting  Medication List     STOP taking these medications    topiramate  50 MG tablet Commonly known as: TOPAMAX        TAKE these medications    acetaminophen  500 MG tablet Commonly known as: TYLENOL  Take 1,000 mg by mouth every 6 (six) hours as needed for mild pain (pain score 1-3).   albuterol  108 (90 Base) MCG/ACT  inhaler Commonly known as: VENTOLIN  HFA Inhale 2 puffs into the lungs every 6 (six) hours as needed for wheezing or shortness of breath.   amLODipine  10 MG tablet Commonly known as: NORVASC  Take 1 tablet (10 mg total) by mouth in the morning.   aspirin  81 MG chewable tablet Chew 1 tablet (81 mg total) by mouth daily. Start taking on: June 04, 2024   atorvastatin  40 MG tablet Commonly known as: LIPITOR Take 1 tablet (40 mg total) by mouth daily. Start taking on: June 04, 2024   b complex-vitamin c-folic acid  0.8 MG Tabs tablet Take 1 tablet by mouth daily.   busPIRone  7.5 MG tablet Commonly known as: BUSPAR  Take 1 tablet (7.5 mg total) by mouth daily. What changed:  medication strength how much to take when to take this   camphor-menthol  lotion Commonly known as: SARNA Apply topically as needed for itching.   carvedilol  12.5 MG tablet Commonly known as: COREG  Take 1 tablet (12.5 mg total) by mouth 2 (two) times daily with a meal. What changed:  medication strength how much to take   cloNIDine  0.3 MG tablet Commonly known as: CATAPRES  Take 1 tablet (0.3 mg total) by mouth 3 (three) times daily.   hydrALAZINE  100 MG tablet Commonly known as: APRESOLINE  Take 1 tablet (100 mg total) by mouth 3 (three) times daily.   hydrOXYzine  25 MG tablet Commonly known as: ATARAX  Take 25 mg by mouth every 12 (twelve) hours as needed.   levETIRAcetam  250 MG tablet Commonly known as: KEPPRA  Take 2 tablets (500 mg total) by mouth daily AND 1 tablet (250 mg total) every Monday, Wednesday, and Friday with hemodialysis.   losartan  50 MG tablet Commonly known as: COZAAR  Take 50 mg by mouth at bedtime.   Omron 3 Series BP Monitor Devi Use as directed 2 times a day   pantoprazole  40 MG tablet Commonly known as: PROTONIX  TAKE 1 TABLET BY MOUTH DAILY BEFORE BREAKFAST        Follow-up Information     Del Wilhelmena Lloyd Sola, FNP. Schedule an appointment as soon as possible for  a visit in 1 week(s).   Specialty: Family Medicine Contact information: [address] [place] [address] [phone]         [facility]. Go in 2 month(s).   Specialty: Neurology Contact information: [address] [place]  [address] [phone]               Allergies  Allergen Reactions   Dialyvite 800 [Nephro-Vite] Nausea And Vomiting    Consultations: Neurology Nephrology   Procedures/Studies: ECHOCARDIOGRAM COMPLETE BUBBLE STUDY Result Date: 06/03/2024    ECHOCARDIOGRAM REPORT   Patient Name:   Cathy [name] Date of Exam: 06/03/2024 Medical Rec #:  [id number]        Height:       67.0 in Accession #:    [id number]       Weight:       136.9 lb Date of Birth:  15-May-1981       BSA:  1.721 m Patient Age:    42 years         BP:           108/55 mmHg Patient Gender: F                HR:           49 bpm. Exam Location:  Inpatient Procedure: 2D Echo (Both Spectral and Color Flow Doppler were utilized during            procedure). Indications:    stroke  History:        Patient has prior history of Echocardiogram examinations, most                 recent 02/03/2022. Lupus. End stage renal disease; Risk                 Factors:Current Smoker and Hypertension.  Sonographer:    Tinnie Barefoot RDCS Referring Phys: [id number] ARLIN O YADAV IMPRESSIONS  1. Left ventricular ejection fraction, by estimation, is 60 to 65%. The left ventricle has normal function. The left ventricle has no regional wall motion abnormalities. There is severe asymmetric left ventricular hypertrophy of the lateral segment. Left ventricular diastolic parameters are indeterminate.  2. Deep trabeculations noted in the right ventricle, r/o righr ventricular noncompaction. Right ventricular systolic function is moderately reduced. The right ventricular size is mildly enlarged. There is normal pulmonary artery systolic pressure. Consider  cardiac MRI.  3. Left atrial size was moderately dilated.  4. Right atrial size was severely dilated.  5. The mitral valve is normal in structure. No evidence of mitral valve regurgitation. No evidence of mitral stenosis.  6. The tricuspid valve is abnormal, thickened. Tricuspid valve regurgitation is moderate.  7. The aortic valve is tricuspid. Aortic valve regurgitation is mild. No aortic stenosis is present. Aortic valve area, by VTI measures 2.57 cm. Aortic valve mean gradient measures 9.0 mmHg. Aortic valve Vmax measures 2.18 m/s. Increased aortic valve velocities likely due to increased LVOT velocity from small LVOT. Aortic valve leaflet excursion is normal, no stenosis visually.  8. The inferior vena cava is normal in size with greater than 50% respiratory variability, suggesting right atrial pressure of 3 mmHg.  9. Agitated saline contrast bubble study was negative, with no evidence of any interatrial shunt. 10. Increased flow velocities may be secondary to anemia, thyrotoxicosis, hyperdynamic or high flow state. FINDINGS  Left Ventricle: Left ventricular ejection fraction, by estimation, is 60 to 65%. The left ventricle has normal function. The left ventricle has no regional wall motion abnormalities. Strain was performed and the global longitudinal strain is indeterminate. The left ventricular internal cavity size was normal in size. There is severe asymmetric left ventricular hypertrophy of the lateral segment. Left ventricular diastolic parameters are indeterminate. Right Ventricle: The right ventricular size is mildly enlarged. No increase in right ventricular wall thickness. Right ventricular systolic function is moderately reduced. There is normal pulmonary artery systolic pressure. The tricuspid regurgitant velocity is 2.77 m/s, and with an assumed right atrial pressure of 3 mmHg, the estimated right ventricular systolic pressure is 33.7 mmHg. Left Atrium: Left atrial size was moderately dilated.  Right Atrium: Right atrial size was severely dilated. Pericardium: There is no evidence of pericardial effusion. Mitral Valve: The mitral valve is normal in structure. No evidence of mitral valve regurgitation. No evidence of mitral valve stenosis. Tricuspid Valve: The tricuspid valve is abnormal. Tricuspid valve regurgitation is moderate . No evidence of tricuspid  stenosis. Aortic Valve: The aortic valve is tricuspid. Aortic valve regurgitation is mild. No aortic stenosis is present. Aortic valve mean gradient measures 9.0 mmHg. Aortic valve peak gradient measures 19.0 mmHg. Aortic valve area, by VTI measures 2.57 cm. Pulmonic Valve: The pulmonic valve was normal in structure. Pulmonic valve regurgitation is mild. No evidence of pulmonic stenosis. Aorta: The aortic root and ascending aorta are structurally normal, with no evidence of dilitation. Venous: The inferior vena cava is normal in size with greater than 50% respiratory variability, suggesting right atrial pressure of 3 mmHg. IAS/Shunts: No atrial level shunt detected by color flow Doppler. Agitated saline contrast was given intravenously to evaluate for intracardiac shunting. Agitated saline contrast bubble study was negative, with no evidence of any interatrial shunt. Additional Comments: 3D was performed not requiring image post processing on an independent workstation and was indeterminate.  LEFT VENTRICLE PLAX 2D LVIDd:         4.70 cm   Diastology LVIDs:         2.90 cm   LV e' medial:    6.74 cm/s LV PW:         1.50 cm   LV E/e' medial:  12.8 LV IVS:        1.10 cm   LV e' lateral:   6.96 cm/s LVOT diam:     2.00 cm   LV E/e' lateral: 12.4 LV SV:         119 LV SV Index:   69 LVOT Area:     3.14 cm  RIGHT VENTRICLE             IVC RV Basal diam:  3.60 cm     IVC diam: 2.10 cm RV S prime:     12.90 cm/s TAPSE (M-mode): 2.5 cm LEFT ATRIUM           Index        RIGHT ATRIUM           Index LA diam:      4.30 cm 2.50 cm/m   RA Area:     27.60 cm LA  Vol (A2C): 76.5 ml 44.44 ml/m  RA Volume:   102.00 ml 59.25 ml/m  AORTIC VALVE                     PULMONIC VALVE AV Area (Vmax):    2.72 cm      PR End Diast Vel: 5.34 msec AV Area (Vmean):   2.46 cm AV Area (VTI):     2.57 cm AV Vmax:           218.00 cm/s AV Vmean:          138.000 cm/s AV VTI:            0.464 m AV Peak Grad:      19.0 mmHg AV Mean Grad:      9.0 mmHg LVOT Vmax:         189.00 cm/s LVOT Vmean:        108.000 cm/s LVOT VTI:          0.379 m LVOT/AV VTI ratio: 0.82  AORTA Ao Root diam: 3.00 cm Ao Asc diam:  3.50 cm MITRAL VALVE               TRICUSPID VALVE MV Area (PHT): 2.71 cm    TR Peak grad:   30.7 mmHg MV Decel Time: 280 msec    TR Vmax:  277.00 cm/s MV E velocity: 86.00 cm/s MV A velocity: 55.80 cm/s  SHUNTS MV E/A ratio:  1.54        Systemic VTI:  0.38 m                            Systemic Diam: 2.00 cm Vishnu Priya Mallipeddi Electronically signed by Diannah Late Mallipeddi Signature Date/Time: 06/03/2024/3:51:05 PM    Final    MR BRAIN WO CONTRAST Result Date: 06/02/2024 CLINICAL DATA:  Mental status change, unknown cause Seizure, new-onset, no history of trauma EXAM: MRI HEAD WITHOUT CONTRAST TECHNIQUE: Multiplanar, multiecho pulse sequences of the brain and surrounding structures were obtained without intravenous contrast. COMPARISON:  CT of the head dated May 31, 2024. FINDINGS: Brain: There are focal encephalomalacia changes again demonstrated within the left mid frontal lobe with underlying gliosis, presumably from prior infarct. There is mild blooming artifact within the subcortical white matter suggesting hemosiderin staining. The brain otherwise appears normal. There is no evidence of mass or hydrocephalus. Vascular: Normal vascular flow voids. Skull and upper cervical spine: Normal marrow signal. Sinuses/Orbits: No acute process. Other: None. IMPRESSION: 1. Focal encephalomalacia changes within the left mid frontal lobe, most likely secondary to focal cortical  infarct with associated petechial hemorrhage. Electronically Signed   By: Evalene Coho M.D.   On: 06/02/2024 12:34   EEG adult Result Date: 05/31/2024 Shelton Arlin KIDD, MD     05/31/2024  5:57 PM Patient Name: Cathy [name] MRN: [id number] Epilepsy Attending: Arlin KIDD Shelton Referring Physician/Provider: Evonnie Lenis, MD Date: 05/31/2024 Duration: 23.27 mins Patient history: 43 year old female with multiple medical comorbidities including end-stage renal disease who missed her dialysis on Monday morning and subsequently came after seizure-like episode. EEG to evaluate for seizure. Level of alertness: Awake AEDs during EEG study: LEV Technical aspects: This EEG study was done with scalp electrodes positioned according to the 10-20 International system of electrode placement. Electrical activity was reviewed with band pass filter of 1-70Hz , sensitivity of 7 uV/mm, display speed of 38mm/sec with a 60Hz  notched filter applied as appropriate. EEG data were recorded continuously and digitally stored.  Video monitoring was available and reviewed as appropriate. Description:  EEG showed continuous generalized predominantly 5 to 6 Hz theta slowing admixed with intermittent 2-3hz  delta slowing, at times with triphasic morphology. Hyperventilation and photic stimulation were not performed.    ABNORMALITY - Continuous slow, generalized  IMPRESSION: This study is suggestive of moderate diffuse encephalopathy. No seizures or epileptiform discharges were seen throughout the recording.  Arlin KIDD Shelton   DG Chest Port 1 View Result Date: 05/31/2024 CLINICAL DATA:  43 year old female with altered mental status, end stage renal disease. EXAM: PORTABLE CHEST 1 VIEW COMPARISON:  Chest radiographs 03/02/2024 and earlier. FINDINGS: Portable AP upright view at 0353 hours. Stable left chest dual lumen dialysis type catheter. Mildly lower lung volumes. Stable cardiomegaly and mediastinal contours. Visualized tracheal air column is  within normal limits. Veiling opacity along the right minor fissure has mildly increased, pleural fluid tracking in the right lung fissures on the previous exam. No superimposed pneumothorax. Bilateral pulmonary vascularity appears increased. Stable left lung base ventilation, no pleural effusion identified previously. Paucity of bowel gas in the upper abdomen. No acute osseous abnormality identified. IMPRESSION: 1. Chronic cardiomegaly with increased pulmonary vascular congestion. Consider mild or developing interstitial edema. 2. Pleural fluid chronically tracking in the right lung fissures, stable to mildly increased since April. Electronically Signed  By: VEAR Hurst M.D.   On: 05/31/2024 04:16   CT Head Wo Contrast Result Date: 05/31/2024 CLINICAL DATA:  Altered mental status EXAM: CT HEAD WITHOUT CONTRAST TECHNIQUE: Contiguous axial images were obtained from the base of the skull through the vertex without intravenous contrast. RADIATION DOSE REDUCTION: This exam was performed according to the departmental dose-optimization program which includes automated exposure control, adjustment of the mA and/or kV according to patient size and/or use of iterative reconstruction technique. COMPARISON:  09/01/2016 FINDINGS: Brain: No evidence of acute infarction, hemorrhage, hydrocephalus, extra-axial collection or mass lesion/mass effect. Encephalomalacia in the left frontal lobe laterally is noted consistent with prior ischemia. Vascular: No hyperdense vessel or unexpected calcification. Skull: Normal. Negative for fracture or focal lesion. Sinuses/Orbits: No acute finding. Other: None. IMPRESSION: No acute intracranial abnormality noted. Electronically Signed   By: Oneil Devonshire M.D.   On: 05/31/2024 04:04     Discharge Exam: Vitals:   06/03/24 0435 06/03/24 1401  BP: 138/74 (!) 108/55  Pulse: (!) 46 (!) 48  Resp: 15 18  Temp: 98.2 F (36.8 C) 98.7 F (37.1 C)  SpO2: 100% 94%   Vitals:   06/02/24 2115  06/02/24 2348 06/03/24 0435 06/03/24 1401  BP: (!) 143/85 (!) 154/84 138/74 (!) 108/55  Pulse: (!) 53 (!) 55 (!) 46 (!) 48  Resp: 12 14 15 18   Temp: 98.6 F (37 C) 98.2 F (36.8 C) 98.2 F (36.8 C) 98.7 F (37.1 C)  TempSrc: Oral Oral Oral Oral  SpO2: 97% 100% 100% 94%  Weight:      Height:        General: Pt is alert, awake, not in acute distress Cardiovascular: RRR, S1/S2 +, no rubs, no gallops Respiratory: CTA bilaterally, no wheezing, no rhonchi Abdominal: Soft, NT, ND, bowel sounds + Extremities: no edema, no cyanosis    The results of significant diagnostics from this hospitalization (including imaging, microbiology, ancillary and laboratory) are listed below for reference.     Microbiology: Recent Results (from the past 240 hours)  MRSA Next Gen by PCR, Nasal     Status: None   Collection Time: 05/31/24  7:22 AM   Specimen: Nasal Mucosa; Nasal Swab  Result Value Ref Range Status   MRSA by PCR Next Gen NOT DETECTED NOT DETECTED Final    Comment: (NOTE) The GeneXpert MRSA Assay (FDA approved for NASAL specimens only), is one component of a comprehensive MRSA colonization surveillance program. It is not intended to diagnose MRSA infection nor to guide or monitor treatment for MRSA infections. Test performance is not FDA approved in patients less than 73 years old. Performed at [facility], [address] [place], [place] [address]      Labs: BNP (last 3 results) No results for input(s): BNP in the last 8760 hours. Basic Metabolic Panel: Recent Labs  Lab 05/31/24 0342 06/01/24 0453 06/02/24 0414 06/03/24 0413  NA 140 137 140 137  K 6.1* 4.6 5.0 4.2  CL 98 98 100 98  CO2 24 27 26 28   GLUCOSE 119* 80 95 97  BUN 59* 38* 56* 36*  CREATININE 11.81* 8.15* 10.49* 7.36*  CALCIUM  7.9* 8.6* 8.0* 8.2*  MG 2.1  --  2.1 1.9  PHOS 8.7*  --   --   --    Liver Function Tests: Recent Labs  Lab 05/31/24 0342 06/01/24 0453 06/02/24 0414 06/03/24 0413  AST 21  20 16 16   ALT 13 13 11 12   ALKPHOS 227* 223* 200*  216*  BILITOT 0.7 0.8 0.6 0.6  PROT 6.6 6.8 6.2* 6.5  ALBUMIN  3.1* 3.2* 2.8* 2.9*   No results for input(s): LIPASE, AMYLASE in the last 168 hours. Recent Labs  Lab 05/31/24 0342 06/01/24 0453 06/03/24 0413  AMMONIA 42* 23 32   CBC: Recent Labs  Lab 05/31/24 0342 06/01/24 0453 06/02/24 0414 06/02/24 0814 06/03/24 0413  WBC 7.1 5.1 4.7  --  4.6  NEUTROABS 5.9  --   --   --   --   HGB 8.0* 8.3* 7.1* 7.2* 8.3*  HCT 25.2* 27.4* 22.6* 23.4* 25.7*  MCV 94.7 96.8 95.8  --  91.1  PLT 142* 123* 117*  --  119*   Cardiac Enzymes: No results for input(s): CKTOTAL, CKMB, CKMBINDEX, TROPONINI in the last 168 hours. BNP: Invalid input(s): POCBNP CBG: Recent Labs  Lab 05/31/24 0409 06/01/24 0431  GLUCAP 142* 83   D-Dimer No results for input(s): DDIMER in the last 72 hours. Hgb A1c No results for input(s): HGBA1C in the last 72 hours. Lipid Profile Recent Labs    06/03/24 0413  CHOL 201*  HDL 32*  LDLCALC 139*  TRIG 148  CHOLHDL 6.3   Thyroid  function studies No results for input(s): TSH, T4TOTAL, T3FREE, THYROIDAB in the last 72 hours.  Invalid input(s): FREET3 Anemia work up Recent Labs    06/01/24 0453  TIBC 271  IRON  32   Urinalysis    Component Value Date/Time   COLORURINE YELLOW 07/05/2023 1146   APPEARANCEUR Clear 01/29/2024 1022   LABSPEC 1.013 07/05/2023 1146   PHURINE 8.0 07/05/2023 1146   GLUCOSEU Negative 01/29/2024 1022   HGBUR NEGATIVE 07/05/2023 1146   BILIRUBINUR Negative 01/29/2024 1022   KETONESUR NEGATIVE 07/05/2023 1146   PROTEINUR Negative 01/29/2024 1022   PROTEINUR >=300 (A) 07/05/2023 1146   UROBILINOGEN 0.2 10/17/2020 1026   NITRITE Negative 01/29/2024 1022   NITRITE NEGATIVE 07/05/2023 1146   LEUKOCYTESUR Negative 01/29/2024 1022   LEUKOCYTESUR SMALL (A) 07/05/2023 1146   Sepsis Labs Recent Labs  Lab 05/31/24 0342 06/01/24 0453 06/02/24 0414  06/03/24 0413  WBC 7.1 5.1 4.7 4.6   Microbiology Recent Results (from the past 240 hours)  MRSA Next Gen by PCR, Nasal     Status: None   Collection Time: 05/31/24  7:22 AM   Specimen: Nasal Mucosa; Nasal Swab  Result Value Ref Range Status   MRSA by PCR Next Gen NOT DETECTED NOT DETECTED Final    Comment: (NOTE) The GeneXpert MRSA Assay (FDA approved for NASAL specimens only), is one component of a comprehensive MRSA colonization surveillance program. It is not intended to diagnose MRSA infection nor to guide or monitor treatment for MRSA infections. Test performance is not FDA approved in patients less than 53 years old. Performed at [facility], [address], [place], [place] [address]      Time coordinating discharge: 35 minutes  SIGNED:   Adron JONETTA Fairly, DO [facility] 06/03/2024, 4:54 PM  If 7PM-7AM, please contact night-coverage [email or web address]

## 2024-06-03 NOTE — TOC Initial Note (Incomplete)
 Transition of Care [facility]) - Initial/Assessment Note    Patient Details  Name: Cathy [name] MRN: [id number] Date of Birth: 08-14-1981  Transition of Care [facility]) CM/SW Contact:    Lorraine LILLETTE Fenton, LCSW Phone Number: 06/03/2024, 12:23 PM  Clinical Narrative:                   Expected Discharge Plan: Home/Self Care Barriers to Discharge: Continued Medical Work up   Patient Goals and CMS Choice Patient states their goals for this hospitalization and ongoing recovery are:: Return home CMS Medicare.gov Compare Post Acute Care list provided to:: Patient        Expected Discharge Plan and Services       Living arrangements for the past 2 months: Single Family Home Expected Discharge Date: 06/02/24                                    Prior Living Arrangements/Services Living arrangements for the past 2 months: Single Family Home Lives with:: Parents Patient language and need for interpreter reviewed:: Yes        Need for Family Participation in Patient Care: Yes (Comment) Care giver support system in place?: Yes (comment)   Criminal Activity/Legal Involvement Pertinent to Current Situation/Hospitalization: No - Comment as needed  Activities of Daily Living   ADL Screening (condition at time of admission) Independently performs ADLs?: Yes (appropriate for developmental age) Is the patient deaf or have difficulty hearing?: No Does the patient have difficulty seeing, even when wearing glasses/contacts?: No Does the patient have difficulty concentrating, remembering, or making decisions?: No  Permission Sought/Granted                  Emotional Assessment     Affect (typically observed): Unable to Assess   Alcohol / Substance Use: Not Applicable    Admission diagnosis:  Uremia [N19] ESRD on hemodialysis (HCC) [N18.6, Z99.2] History of cirrhosis of liver [Z87.19] Altered mental status, unspecified altered mental status type [R41.82] Acute metabolic  encephalopathy [G93.41] Patient Active Problem List   Diagnosis Date Noted   Acute metabolic encephalopathy 05/31/2024   Hyperammonemia (HCC) 05/31/2024   NASH (nonalcoholic steatohepatitis) 05/31/2024   Abnormal pleural fluid 05/31/2024   Prolonged QT interval 05/31/2024   Cellulitis 02/20/2024   Chronic migraine w/o aura w/o status migrainosus, not intractable 12/18/2023   Vaginal discharge 12/18/2023   Elevated ferritin 10/19/2023   SLE (systemic lupus erythematosus related syndrome) (HCC) 10/19/2023   Hypertension 08/27/2023   Dyspnea 08/27/2023   Acute on chronic clinical systolic heart failure (HCC) 10/27/2022   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 cor pulmonale (HCC) 10/27/2022   Gastroesophageal reflux disease    Dysphagia    Abnormal MRI, liver    Cirrhosis of liver with ascites (HCC)    Acute hyperkalemia 06/06/2022   Abdominal pain 03/17/2022   Hypothyroidism, unspecified 03/05/2022   Ascites 03/02/2022   Volume overload 03/01/2022   Tobacco use 01/29/2022   Pulmonary embolism (HCC) 01/09/2022   History of pulmonary embolus 01/07/2022   Malignant hypertension 01/06/2022   Anemia due to chronic kidney disease 12/28/2021   Acute on chronic combined systolic and diastolic CHF (congestive heart failure) (HCC) 12/27/2021   Benign essential HTN 06/08/2021   HFrEF (heart failure with reduced ejection fraction) (HCC)    Nephrotic syndrome with focal and segmental glomerular lesions 03/12/2021   Encounter for antineoplastic chemotherapy 01/07/2021   ESRD on  hemodialysis (HCC) 12/23/2020   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   Anxiety and depression 12/14/2020   Iron  deficiency anemia, unspecified 12/14/2020   Systemic lupus erythematosus, unspecified (HCC) 12/14/2020   Secondary hyperparathyroidism of renal origin (HCC) 12/14/2020   Pericardial effusion-chronic     Interstitial edema 11/07/2020   Pelvic adhesive disease 02/03/2019   Renal hypertension 02/02/2019   Non  compliance w medication regimen 01/12/2019   Anemia 10/12/2018   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 [place] [address] Phone: [phone] Fax: [phone]     Social Drivers of Health (SDOH) Social History: SDOH Screenings   Food Insecurity: Patient Unable To Answer (05/31/2024)  Housing: Unknown (05/31/2024)  Transportation Needs: No Transportation Needs (05/31/2024)  Utilities: Not At Risk (05/31/2024)  Alcohol Screen: Low Risk  (02/18/2024)  Depression (PHQ2-9): Medium Risk (02/18/2024)  Financial Resource Strain: Low Risk  (02/18/2024)  Physical Activity: Inactive (02/18/2024)  Social Connections: Socially Isolated (02/18/2024)  Stress: Stress Concern Present (02/18/2024)  Tobacco Use: High Risk (05/31/2024)   SDOH Interventions:     Readmission Risk Interventions    05/31/2024    1:09 PM 08/11/2022   12:50 PM 03/18/2022    9:17 AM  Readmission Risk Prevention Plan  Transportation Screening Complete Complete Complete  Medication Review Oceanographer) Complete Complete Complete  PCP or Specialist appointment within 3-5 days of discharge Not Complete    HRI or Home Care Consult Complete Complete Complete  SW Recovery Care/Counseling Consult Complete Complete Complete  Palliative Care Screening Not Applicable Not Applicable Not Applicable  Skilled Nursing Facility Not Applicable Not Applicable Not Applicable

## 2024-06-03 NOTE — Plan of Care (Signed)
  Problem: Acute Rehab PT Goals(only PT should resolve) Goal: Pt Will Go Supine/Side To Sit Outcome: Progressing Flowsheets (Taken 06/03/2024 1045) Pt will go Supine/Side to Sit: Independently Goal: Patient Will Transfer Sit To/From Stand Outcome: Progressing Flowsheets (Taken 06/03/2024 1045) Patient will transfer sit to/from stand: Independently Goal: Pt Will Transfer Bed To Chair/Chair To Bed Outcome: Progressing Flowsheets (Taken 06/03/2024 1045) Pt will Transfer Bed to Chair/Chair to Bed: Independently Goal: Pt Will Ambulate Outcome: Progressing Flowsheets (Taken 06/03/2024 1045) Pt will Ambulate:  100 feet  with modified independence

## 2024-06-03 NOTE — Progress Notes (Signed)
  Echocardiogram 2D Echocardiogram has been performed.  Cathy [name] 06/03/2024, 3:07 PM

## 2024-06-03 NOTE — Evaluation (Signed)
 Physical Therapy Evaluation Patient Details Name: Cathy [name] MRN: [id number] DOB: [birth year] Today's Date: 06/03/2024  History of Present Illness  Cathy [name] is a 43 y.o. female with medical history significant of ESRD on HD (MWF), prior PE, NASH, systolic and diastolic CHF, hypertension, and SLE who presented to the ED from home via EMS due to altered mental status which started shortly PTA.  Patient was unable to provide history, history was obtained from ED physician and ED medical record.  Per report, last known well was 2 AM, around 3 AM, she was noted to be more confused, so EMS was activated and on arrival to the ED she was able to follow-up most commands, though, she did not answer questions.  She was usually compliant with her dialysis, but she missed her dialysis yesterday due to transportation issues.Cathy [name] is a 43 y.o. female with medical history significant of ESRD on HD (MWF), prior PE, NASH, systolic and diastolic CHF, hypertension, and SLE who presented to the ED from home via EMS due to altered mental status which started shortly PTA.  Patient was unable to provide history, history was obtained from ED physician and ED medical record.  Per report, last known well was 2 AM, around 3 AM, she was noted to be more confused, so EMS was activated and on arrival to the ED she was able to follow-up most commands, though, she did not answer questions.  She was usually compliant with her dialysis, but she missed her dialysis yesterday due to transportation issues. MRI positive for focal encephalomalacia as well as focal cortical infarct associated with petechial hemorrhage    Clinical Impression  On therapist arrival; patient lying in bed; lethargic but rouses with increased volume with greeting.  She is agreeable to therapist assessment.  Patient performs supine to sit with Supervision; taking extra time due to lethargy.  Patient is able to sit on the side of the bed with good  balance and don her socks independently.  Patient performs sit to stand with CGA for safety and take a couple of steps over to the chair.  She sits with uncontrolled descent.  patient left in chair with call button in reach, chair alarm set and nursing notified of mobility status.  Patient will benefit from continued skilled therapy services during the remainder of her hospital stay and at the next recommended venue of care to address deficits and promote return to optimal function.          If plan is discharge home, recommend the following: A little help with walking and/or transfers;A little help with bathing/dressing/bathroom;Help with stairs or ramp for entrance   Can travel by private vehicle        Equipment Recommendations None recommended by PT  Recommendations for Other Services       Functional Status Assessment Patient has had a recent decline in their functional status and demonstrates the ability to make significant improvements in function in a reasonable and predictable amount of time.     Precautions / Restrictions Precautions Precautions: Fall Restrictions Weight Bearing Restrictions Per Provider Order: No      Mobility  Bed Mobility Overal bed mobility: Needs Assistance Bed Mobility: Supine to Sit     Supine to sit: HOB elevated, Supervision       Patient Response:  (lethargic)  Transfers Overall transfer level: Needs assistance Equipment used: None Transfers: Sit to/from Stand, Bed to chair/wheelchair/BSC Sit to Stand: Contact guard assist   Step  pivot transfers: Contact guard assist       General transfer comment: CGA for safety as patient is lethargic    Ambulation/Gait Ambulation/Gait assistance: Contact guard assist Gait Distance (Feet): 2 Feet Assistive device: None         General Gait Details: slow  Stairs            Wheelchair Mobility     Tilt Bed Tilt Bed Patient Response:  (lethargic)  Modified Rankin (Stroke  Patients Only)       Balance Overall balance assessment: Needs assistance Sitting-balance support: Feet supported Sitting balance-Leahy Scale: Good Sitting balance - Comments: able to don socks independently   Standing balance support: No upper extremity supported Standing balance-Leahy Scale: Fair Standing balance comment: fair to good standing balance CGA for safety due to lethargy                             Pertinent Vitals/Pain Pain Assessment Pain Assessment: 0-10 Pain Score: 8  Facial Expression: Relaxed, neutral Pain Location: head and body Pain Intervention(s): Monitored during session    Home Living Family/patient expects to be discharged to:: Private residence Living Arrangements: Parent;Children (5 y/o dtr; 4 boys 15-21 yrs old) Available Help at Discharge: Family;Available 24 hours/day   Home Access: Stairs to enter Entrance Stairs-Rails: Doctor, general practice of Steps: 5   Home Layout: One level Home Equipment: None      Prior Function Prior Level of Function : Independent/Modified Independent;Driving             Mobility Comments: independent ADLs Comments: independent     Extremity/Trunk Assessment   Upper Extremity Assessment Upper Extremity Assessment: Defer to OT evaluation    Lower Extremity Assessment Lower Extremity Assessment: Generalized weakness    Cervical / Trunk Assessment Cervical / Trunk Assessment: Normal  Communication   Communication Communication: No apparent difficulties    Cognition Arousal: Lethargic Behavior During Therapy: WFL for tasks assessed/performed                             Following commands: Intact       Cueing       General Comments      Exercises     Assessment/Plan    PT Assessment Patient needs continued PT services  PT Problem List Decreased strength;Decreased balance;Pain       PT Treatment Interventions Functional mobility  training;Therapeutic exercise;Therapeutic activities;Balance training;Patient/family education    PT Goals (Current goals can be found in the Care Plan section)  Acute Rehab PT Goals Patient Stated Goal: return home PT Goal Formulation: With patient Time For Goal Achievement: 06/17/24 Potential to Achieve Goals: Good    Frequency       Co-evaluation PT/OT/SLP Co-Evaluation/Treatment: Yes Reason for Co-Treatment: Complexity of the patient's impairments (multi-system involvement);For patient/therapist safety PT goals addressed during session: Mobility/safety with mobility OT goals addressed during session: ADL's and self-care;Proper use of Adaptive equipment and DME       AM-PAC PT 6 Clicks Mobility  Outcome Measure Help needed turning from your back to your side while in a flat bed without using bedrails?: None Help needed moving from lying on your back to sitting on the side of a flat bed without using bedrails?: A Little Help needed moving to and from a bed to a chair (including a wheelchair)?: A Little Help needed standing up from a chair using  your arms (e.g., wheelchair or bedside chair)?: A Little Help needed to walk in hospital room?: A Little Help needed climbing 3-5 steps with a railing? : A Lot 6 Click Score: 18    End of Session   Activity Tolerance: Patient limited by lethargy;Patient tolerated treatment well Patient left: in chair;with chair alarm set;with nursing/sitter in room Nurse Communication: Mobility status PT Visit Diagnosis: Unsteadiness on feet (R26.81);Muscle weakness (generalized) (M62.81);Pain Pain - part of body:  (head and body)    Time: 1000-1025 PT Time Calculation (min) (ACUTE ONLY): 25 min   Charges:   PT Evaluation $PT Eval Moderate Complexity: 1 Mod   PT General Charges $$ ACUTE PT VISIT: 1 Visit         10:45 AM, 06/03/24 Edith Groleau Small Adea Geisel MPT [facility] [place] [phone] Ph:[phone]

## 2024-06-03 NOTE — Evaluation (Signed)
 Occupational Therapy Evaluation Patient Details Name: Cathy [name] MRN: [id number] DOB: [birth year] Today's Date: 06/03/2024   History of Present Illness   Cathy [name] is a 43 y.o. female with medical history significant of ESRD on HD (MWF), prior PE, NASH, systolic and diastolic CHF, hypertension, and SLE who presented to the ED from home via EMS due to altered mental status which started shortly PTA.  Patient was unable to provide history, history was obtained from ED physician and ED medical record.  Per report, last known well was 2 AM, around 3 AM, she was noted to be more confused, so EMS was activated and on arrival to the ED she was able to follow-up most commands, though, she did not answer questions.  She was usually compliant with her dialysis, but she missed her dialysis yesterday due to transportation issues.Cathy [name] is a 43 y.o. female with medical history significant of ESRD on HD (MWF), prior PE, NASH, systolic and diastolic CHF, hypertension, and SLE who presented to the ED from home via EMS due to altered mental status which started shortly PTA.  Patient was unable to provide history, history was obtained from ED physician and ED medical record.  Per report, last known well was 2 AM, around 3 AM, she was noted to be more confused, so EMS was activated and on arrival to the ED she was able to follow-up most commands, though, she did not answer questions.  She was usually compliant with her dialysis, but she missed her dialysis yesterday due to transportation issues. MRI positive for focal encephalomalacia as well as focal cortical infarct associated with petechial hemorrhage     Clinical Impressions Pt seen as co-evaluation with PT. Pt lethargic, requiring frequent cuing to remain alert. Pt completing mobility tasks at supervision/CGA level. Pt reports her Mom assists her with any ADL needs that she has. Pt near baseline for mobility, impulsive at times due to lethargy and  cognition. No further OT needs at this time, pt will be discharged to care of nursing staff for remainder of stay.      If plan is discharge home, recommend the following:   A lot of help with bathing/dressing/bathroom;Assistance with cooking/housework;Direct supervision/assist for medications management     Functional Status Assessment   Patient has had a recent decline in their functional status and demonstrates the ability to make significant improvements in function in a reasonable and predictable amount of time.     Equipment Recommendations   None recommended by OT      Precautions/Restrictions   Precautions Precautions: Fall Restrictions Weight Bearing Restrictions Per Provider Order: No     Mobility Bed Mobility Overal bed mobility: Needs Assistance Bed Mobility: Supine to Sit     Supine to sit: HOB elevated, Supervision          Transfers Overall transfer level: Needs assistance Equipment used: None Transfers: Sit to/from Stand, Bed to chair/wheelchair/BSC Sit to Stand: Contact guard assist     Step pivot transfers: Contact guard assist     General transfer comment: CGA for safety as patient is lethargic          ADL either performed or assessed with clinical judgement   ADL Overall ADL's : Needs assistance/impaired                     Lower Body Dressing: Supervision/safety;Sitting/lateral leans Lower Body Dressing Details (indicate cue type and reason): pt donning socks at EOB Toilet Transfer: Supervision/safety;Stand-pivot  Toilet Transfer Details (indicate cue type and reason): simulated with bed to chair transfer           General ADL Comments: Pt requiring supervision/CGA for mobilty      Pertinent Vitals/Pain Pain Assessment Pain Assessment: 0-10 Pain Score: 8  Facial Expression: Relaxed, neutral Pain Location: head and body Pain Descriptors / Indicators: Aching Pain Intervention(s): Limited activity within  patient's tolerance, Monitored during session, Repositioned     Extremity/Trunk Assessment Upper Extremity Assessment Upper Extremity Assessment: Defer to OT evaluation   Lower Extremity Assessment Lower Extremity Assessment: Generalized weakness   Cervical / Trunk Assessment Cervical / Trunk Assessment: Normal   Communication Communication Communication: No apparent difficulties   Cognition Arousal: Lethargic Behavior During Therapy: WFL for tasks assessed/performed Cognition: No apparent impairments                               Following commands: Intact                  Home Living Family/patient expects to be discharged to:: Private residence Living Arrangements: Parent;Children (5 y/o dtr; 4 boys 63-41 yrs old) Available Help at Discharge: Family;Available 24 hours/day   Home Access: Stairs to enter [employer] of Steps: 5 Entrance Stairs-Rails: Right;Left Home Layout: One level     Bathroom Shower/Tub: Chief Strategy Officer: Standard Bathroom Accessibility: Yes   Home Equipment: None          Prior Functioning/Environment Prior Level of Function : Independent/Modified Independent;Driving             Mobility Comments: independent ADLs Comments: independent    OT Problem List: Decreased activity tolerance;Impaired balance (sitting and/or standing)             Co-evaluation PT/OT/SLP Co-Evaluation/Treatment: Yes Reason for Co-Treatment: Complexity of the patient's impairments (multi-system involvement);For patient/therapist safety PT goals addressed during session: Mobility/safety with mobility OT goals addressed during session: ADL's and self-care;Proper use of Adaptive equipment and DME      AM-PAC OT 6 Clicks Daily Activity     Outcome Measure Help from another person eating meals?: None Help from another person taking care of personal grooming?: None Help from another person toileting, which  includes using toliet, bedpan, or urinal?: None Help from another person bathing (including washing, rinsing, drying)?: A Little Help from another person to put on and taking off regular upper body clothing?: None Help from another person to put on and taking off regular lower body clothing?: A Little 6 Click Score: 22   End of Session Nurse Communication: Mobility status  Activity Tolerance: Patient limited by lethargy Patient left: in chair;with call bell/phone within reach;with nursing/sitter in room;with chair alarm set  OT Visit Diagnosis: Muscle weakness (generalized) (M62.81)                Time: 8990-8975 OT Time Calculation (min): 15 min Charges:  OT General Charges $OT Visit: 1 Visit OT Evaluation $OT Eval Low Complexity: 1 Low  Sonny Cory, OTR/L  [phone] 06/03/2024, 10:58 AM

## 2024-06-03 NOTE — TOC Progression Note (Signed)
 Transition of Care [facility]) - Progression Note    Patient Details  Name: Cathy [name] MRN: [id number] Date of Birth: [birth year]  Transition of Care [facility]) CM/SW Contact  Lorraine LILLETTE [place], [place] Phone Number: 06/03/2024, 12:12 PM  Clinical Narrative:    CSW visited pt at bedside to discuss PT recommendation of HHPT- pt was resting but opened eyes and attentive as introduced myself and purpose of the visit.  She declined HHPT  and is not interested in a referral. TOC following.   Expected Discharge Plan: Home/Self Care Barriers to Discharge: Continued Medical Work up  Expected Discharge Plan and Services       Living arrangements for the past 2 months: Single Family Home Expected Discharge Date: 06/02/24                                     Social Determinants of Health (SDOH) Interventions SDOH Screenings   Food Insecurity: Patient Unable To Answer (05/31/2024)  Housing: Unknown (05/31/2024)  Transportation Needs: No Transportation Needs (05/31/2024)  Utilities: Not At Risk (05/31/2024)  Alcohol Screen: Low Risk  (02/18/2024)  Depression (PHQ2-9): Medium Risk (02/18/2024)  Financial Resource Strain: Low Risk  (02/18/2024)  Physical Activity: Inactive (02/18/2024)  Social Connections: Socially Isolated (02/18/2024)  Stress: Stress Concern Present (02/18/2024)  Tobacco Use: High Risk (05/31/2024)    Readmission Risk Interventions    05/31/2024    1:09 PM 08/11/2022   12:50 PM 03/18/2022    9:17 AM  Readmission Risk Prevention Plan  Transportation Screening Complete Complete Complete  Medication Review Oceanographer) Complete Complete Complete  PCP or Specialist appointment within 3-5 days of discharge Not Complete    HRI or Home Care Consult Complete Complete Complete  SW Recovery Care/Counseling Consult Complete Complete Complete  Palliative Care Screening Not Applicable Not Applicable Not Applicable  Skilled Nursing Facility Not Applicable Not Applicable Not Applicable

## 2024-06-03 NOTE — Progress Notes (Addendum)
 I connected with  Cathy [name] on 06/03/24 by a video enabled telemedicine application and verified that I am speaking with the correct person using two identifiers.   I discussed the limitations of evaluation and management by telemedicine. The patient expressed understanding and agreed to proceed.  Location of patient: [facility] hospital Location of physician: [facility]   Subjective: No acute events overnight.  No further seizures since hospital stay.  Reporting diffuse body pain.  Denies any other concerns.  ROS: negative except above  Examination  Vital signs in last 24 hours: Temp:  [97.9 F (36.6 C)-99.7 F (37.6 C)] 98.2 F (36.8 C) (07/04 0435) Pulse Rate:  [46-58] 46 (07/04 0435) Resp:  [12-20] 15 (07/04 0435) BP: (111-162)/(63-98) 138/74 (07/04 0435) SpO2:  [95 %-100 %] 100 % (07/04 0435) Weight:  [62.1 kg-66.1 kg] 62.1 kg (07/03 1630)  General: lying in bed, NAD Neuro: MS: Alert, oriented, follows commands CN: pupils equal and reactive,  EOMI, face symmetric, tongue midline, normal sensation over face, Motor: Antigravity strength without drift in all 4 extremities Coordination: normal Gait: not tested  Basic Metabolic Panel: Recent Labs  Lab 05/31/24 0342 06/01/24 0453 06/02/24 0414 06/03/24 0413  NA 140 137 140 137  K 6.1* 4.6 5.0 4.2  CL 98 98 100 98  CO2 24 27 26 28   GLUCOSE 119* 80 95 97  BUN 59* 38* 56* 36*  CREATININE 11.81* 8.15* 10.49* 7.36*  CALCIUM  7.9* 8.6* 8.0* 8.2*  MG 2.1  --  2.1 1.9  PHOS 8.7*  --   --   --     CBC: Recent Labs  Lab 05/31/24 0342 06/01/24 0453 06/02/24 0414 06/02/24 0814 06/03/24 0413  WBC 7.1 5.1 4.7  --  4.6  NEUTROABS 5.9  --   --   --   --   HGB 8.0* 8.3* 7.1* 7.2* 8.3*  HCT 25.2* 27.4* 22.6* 23.4* 25.7*  MCV 94.7 96.8 95.8  --  91.1  PLT 142* 123* 117*  --  119*     Coagulation Studies: No results for input(s): LABPROT, INR in the last 72 hours.  Imaging personally  reviewed  MRI brain without contrast 06/02/2024:  Focal encephalomalacia changes within the left mid frontal lobe,most likely secondary to focal cortical infarct with associated petechial hemorrhage.  ASSESSMENT AND PLAN: 43 year old female with multiple medical comorbidities including end-stage renal disease who missed her dialysis on Monday morning and subsequently came after seizure-like episode.   First-time seizure likely due to underlying stroke Hyperammonemia (resolved) End-stage renal disease Chronic stroke - Recommend Keppra  500 mg daily as well as an additional dose of 250 mg after dialysis -Recommend aspirin  81 mg daily and atorvastatin  40 mg daily for secondary stroke prevention -Recommend TTE to look for thrombus/PFO -Recommend cardiac monitor to look for paroxysmal A-fib -Seizure precautions including no driving -Follow-up with neurology in 2 to 3 months - Discussed plan with Dr. Maree via secure chat     Thank you for allowing us  to participate in the care of this patient. If you have any further questions, please contact  me or neurohospitalist.    I have spent a total of  36  minutes with the patient reviewing hospital notes,  test results, labs and examining the patient as well as establishing an assessment and plan that was discussed personally with the patient.  > 50% of time was spent in direct patient care.         Karthikeya [facility]  [facility] For questions after 5pm please refer to AMION to reach the Neurologist on call

## 2024-06-06 ENCOUNTER — Telehealth: Payer: Self-pay

## 2024-06-06 NOTE — Transitions of Care (Post Inpatient/ED Visit) (Signed)
 06/06/2024  Name: Cathy [name] MRN: [id number] DOB: 1981-02-11  Today's TOC FU Call Status: Today's TOC FU Call Status:: Successful TOC FU Call Completed TOC FU Call Complete Date: 06/06/24 Patient's Name and Date of Birth confirmed.  Transition Care Management Follow-up Telephone Call Date of Discharge: 06/03/24 [facility] Type of Discharge: Inpatient Admission Primary Inpatient Discharge Diagnosis:: Acute metabolic encephalopathy How have you been since you were released from the hospital?: Better (feelsl better- no seizures) Any questions or concerns?: No  Items Reviewed: Did you receive and understand the discharge instructions provided?: Yes Medications obtained,verified, and reconciled?: Yes (Medications Reviewed) Any new allergies since your discharge?: No Dietary orders reviewed?: Yes Type of Diet Ordered:: low salt heart healthy diet Do you have support at home?: Yes People in Home [RPT]: parent(s) Name of Support/Comfort Primary Source: Mom- Jenkins Lesches  Medications Reviewed Today: Medications Reviewed Today     Reviewed by Rumalda Alan PENNER, RN (Registered Nurse) on 06/06/24 at 1335  Med List Status: <None>   Medication Order Taking? Sig Documenting Provider Last Dose Status Informant  acetaminophen  (TYLENOL ) 500 MG tablet [id number] Yes Take 1,000 mg by mouth every 6 (six) hours as needed for mild pain (pain score 1-3). [provider]  Active Pharmacy Records, Mother  albuterol  (VENTOLIN  HFA) [phone] Base) MCG/ACT inhaler [id number] Yes Inhale 2 puffs into the lungs every 6 (six) hours as needed for wheezing or shortness of breath. Del Wilhelmena Lloyd Sola, FNP  Active Pharmacy Records, Mother           Med Note MAZIE, [place]   Thu Apr 07, 2024  9:13 AM)    amLODipine  (NORVASC ) 10 MG tablet [id number] Yes Take 1 tablet (10 mg total) by mouth in the morning. Del Wilhelmena Lloyd Sola, FNP  Active Pharmacy Records, Mother  aspirin  81 MG  chewable tablet [id number] Yes Chew 1 tablet (81 mg total) by mouth daily. Maree, Pratik D, DO  Active   atorvastatin  (LIPITOR) 40 MG tablet [id number] Yes Take 1 tablet (40 mg total) by mouth daily. Maree, Pratik D, DO  Active   b complex-vitamin c-folic acid  (NEPHRO-VITE) 0.8 MG TABS tablet [id number] Yes Take 1 tablet by mouth daily. [provider]  Active Pharmacy Records, Mother  Blood Pressure Monitoring (OMRON 3 SERIES BP MONITOR) DEVI [id number] Yes Use as directed 2 times a day Del Wilhelmena Lloyd, Sola, FNP  Active Pharmacy Records, Mother  busPIRone  (BUSPAR ) 7.5 MG tablet [id number] Yes Take 1 tablet (7.5 mg total) by mouth daily. Maree, Pratik D, DO  Active   camphor-menthol  [facility]) lotion [id number] Yes Apply topically as needed for itching. Tobie Yetta HERO, MD  Active Pharmacy Records, Mother  carvedilol  (COREG ) 12.5 MG tablet [id number] Yes Take 1 tablet (12.5 mg total) by mouth 2 (two) times daily with a meal. Maree, Pratik D, DO  Active   cloNIDine  (CATAPRES ) 0.3 MG tablet [id number] Yes Take 1 tablet (0.3 mg total) by mouth 3 (three) times daily. Del Wilhelmena Lloyd Sola, FNP  Active Pharmacy Records, Mother  hydrALAZINE  (APRESOLINE ) 100 MG tablet [id number] Yes Take 1 tablet (100 mg total) by mouth 3 (three) times daily. Del Wilhelmena Lloyd Sola, FNP  Active Pharmacy Records, Mother  hydrOXYzine  (ATARAX ) 25 MG tablet [id number] Yes Take 25 mg by mouth every 12 (twelve) hours as needed. [provider]  Active Pharmacy Records, Mother           Med Note DRENA, CHUCK KANDICE Debar May 31, 2024  6:23 PM) Pt's mother was unsure if pt still takes this or not   levETIRAcetam  (KEPPRA ) 250 MG tablet [id number] Yes Take 2 tablets (500 mg total) by mouth daily AND 1 tablet (250 mg total) every Monday, Wednesday, and Friday with hemodialysis. Maree, Pratik D, DO  Active   losartan  (COZAAR ) 50 MG tablet [id number] Yes Take 50 mg by mouth at bedtime. [provider]  Active Pharmacy  Records, Mother  pantoprazole  (PROTONIX ) 40 MG tablet [id number] Yes TAKE 1 TABLET BY MOUTH DAILY BEFORE BREAKFAST Del Wilhelmena Lloyd Sola, FNP  Active   Med List Note Christie Alexander, CPhT 03/23/23 9152): Dialysis tu, th, Sat.            Home Care and Equipment/Supplies: Were Home Health Services Ordered?: No Any new equipment or medical supplies ordered?: No  Functional Questionnaire: Do you need assistance with bathing/showering or dressing?: No Do you need assistance with meal preparation?: No Do you need assistance with eating?: No Do you have difficulty maintaining continence: No Do you need assistance with getting out of bed/getting out of a chair/moving?: No Do you have difficulty managing or taking your medications?: No  Follow up appointments reviewed: PCP Follow-up appointment confirmed?: Yes Date of PCP follow-up appointment?: 06/16/24 Follow-up Provider: PCP Specialist Hospital Follow-up appointment confirmed?: Yes Date of Specialist follow-up appointment?: 07/08/24 Follow-Up Specialty Provider:: Dr. Jeannetta Do you need transportation to your follow-up appointment?: No Do you understand care options if your condition(s) worsen?: Yes-patient verbalized understanding  SDOH Interventions Today    Flowsheet Row Most Recent Value  SDOH Interventions   Food Insecurity Interventions Intervention Not Indicated  Housing Interventions Intervention Not Indicated  Utilities Interventions Intervention Not Indicated    Goals      VBCI Transitions of Care (TOC) Care Plan     Problems:  Recent Hospitalization for treatment of lupus, ESRD, new onset of seizures  Functional/Safety concern: new onset of seizure.  Encouraged patient not to drive until cleared by MD,  c/o headaches No BP cuff  Goal:  Over the next 30 days, the patient will not experience hospital readmission  Interventions:  Transitions of Care: Doctor Visits  - discussed the importance of doctor  visits Call MD for changes in condition Reviewed follow up with PCP for 10 days.   Reviewed new patient appointment with rheumatology  Reviewed SDOH and no needs Reviewed and offered 30 day TOC program and patient consented.  Evaluation of current treatment plan related to seizures,  self-management and patient's adherence to plan as established by provider. Discussed plans with patient for ongoing care management follow up and provided patient with direct contact information for care management team Advised patient to seek medical attention for an additional seizure. Provided education to patient- reviewed patient should not drive until cleared by MD since she has new onset of seizure Reviewed medications with patient and discussed.  Importance of not missing any medications.  Discussed plans with patient for ongoing care management follow up and provided patient with direct contact information for care management team Screening for signs and symptoms of depression related to chronic disease state  Assessed social determinant of health barriers Reviewed importance of monitoring BP and encouraged patient to get a BP cuff.  Reviewed importance of not missing any dialysis sessions.  Confirmed that patient has transportation to appointments.   Patient Self Care Activities:  Attend all scheduled provider appointments Call pharmacy for medication refills 3-7 days in advance of running out of  medications Call provider office for new concerns or questions  Notify RN Care Manager of TOC call rescheduling needs Participate in Transition of Care Program/Attend TOC scheduled calls Perform all self care activities independently  Take medications as prescribed   Seek emergency care for seizure Practice safety and avoid falls Attend dialysis as planned every MWF Do not drive until cleared by MD.   Plan:  Telephone follow up appointment with care management team member scheduled for:  06/14/2024   with Mliss Creed RN at 0930 The patient has been provided with contact information for the care management team and has been advised to call with any health related questions or concerns.         Alan Ee, RN, BSN, CEN [employer]- Transition of Care Team.  [facility] [phone]

## 2024-06-08 ENCOUNTER — Telehealth: Payer: Self-pay | Admitting: *Deleted

## 2024-06-08 DIAGNOSIS — I639 Cerebral infarction, unspecified: Secondary | ICD-10-CM

## 2024-06-08 DIAGNOSIS — N186 End stage renal disease: Secondary | ICD-10-CM | POA: Diagnosis not present

## 2024-06-08 DIAGNOSIS — Z992 Dependence on renal dialysis: Secondary | ICD-10-CM | POA: Diagnosis not present

## 2024-06-08 NOTE — Telephone Encounter (Signed)
-----   Message from Adron JONETTA Fairly sent at 06/03/2024  3:09 PM EDT ----- Patient needs 30 day monitor for CVA. Thanks!

## 2024-06-08 NOTE — Telephone Encounter (Signed)
Order placed and pt enrolled in [employer].

## 2024-06-10 DIAGNOSIS — N186 End stage renal disease: Secondary | ICD-10-CM | POA: Diagnosis not present

## 2024-06-10 DIAGNOSIS — Z992 Dependence on renal dialysis: Secondary | ICD-10-CM | POA: Diagnosis not present

## 2024-06-13 DIAGNOSIS — Z992 Dependence on renal dialysis: Secondary | ICD-10-CM | POA: Diagnosis not present

## 2024-06-13 DIAGNOSIS — N186 End stage renal disease: Secondary | ICD-10-CM | POA: Diagnosis not present

## 2024-06-13 LAB — DRUG SCREEN 10 W/CONF, SERUM
Amphetamines, IA: NEGATIVE ng/mL
Barbiturates, IA: NEGATIVE ug/mL
Benzodiazepines, IA: NEGATIVE ng/mL
Cocaine & Metabolite, IA: NEGATIVE ng/mL
Methadone, IA: NEGATIVE ng/mL
Opiates, IA: NEGATIVE ng/mL
Oxycodones, IA: NEGATIVE ng/mL
Phencyclidine, IA: NEGATIVE ng/mL
Propoxyphene, IA: NEGATIVE ng/mL
THC(Marijuana) Metabolite, IA: POSITIVE ng/mL — AB

## 2024-06-13 LAB — THC,MS,WB/SP RFX
Cannabidiol: NEGATIVE ng/mL
Cannabinoid Confirmation: POSITIVE
Carboxy-THC: 33.3 ng/mL
Hydroxy-THC: NEGATIVE ng/mL
Tetrahydrocannabinol(THC): 2.4 ng/mL

## 2024-06-14 ENCOUNTER — Other Ambulatory Visit: Payer: Self-pay | Admitting: *Deleted

## 2024-06-14 NOTE — Transitions of Care (Post Inpatient/ED Visit) (Signed)
 Transition of Care week 2  Visit Note  06/14/2024  Name: PAETYN PIETRZAK MRN: [id number]          DOB: 1981/04/09  Situation: Patient enrolled in [facility] 30-day program. Visit completed with patient by telephone.   Background:    Past Medical History:  Diagnosis Date   Anasarca associated with disorder of kidney 06/08/2021   Anxiety    Asthma    Bipolar depression (HCC)    Cirrhosis (HCC)    Depression    Dyspnea    ESRD (end stage renal disease) on dialysis (HCC) 10/2020   GERD (gastroesophageal reflux disease)    Gonorrhea    Lupus    Pelvic inflammatory disease (PID)    Pleural effusion 06/08/2021   Pulmonary embolism (HCC) 01/31/2022   Renal hypertension    Trichomonas infection     Assessment: Patient Reported Symptoms: Cognitive Cognitive Status: No symptoms reported, Able to follow simple commands, Alert and oriented to person, place, and time, Normal speech and language skills      Neurological Neurological Review of Symptoms: No symptoms reported Neurological Management Strategies: Medication therapy Neurological Self-Management Outcome: 4 (good) Neurological Comment: pt states headaches has resolved,  no seizure activity reported  HEENT HEENT Symptoms Reported: No symptoms reported      Cardiovascular Cardiovascular Symptoms Reported: Fatigue Does patient have uncontrolled Hypertension?: Yes Is patient checking Blood Pressure at home?: No Patient's Recent BP reading at home: pt states she has BP cuff at home and will start checking BP today and keeping a log Cardiovascular Management Strategies: Medication therapy, Routine screening Cardiovascular Comment: pt feels BP medication is causing drowsiness during the day, plans to discuss with primary care provider at visit on 06/16/24  Respiratory Respiratory Symptoms Reported: Other: Other Respiratory Symptoms: reports dypsnea with exertion only on occasion Additional Respiratory Details: reviewed energy  conservation, avoiding outdoors during the day with elevated temperature Respiratory Management Strategies: Medication therapy, Routine screening, Adequate rest Respiratory Self-Management Outcome: 4 (good)  Endocrine Endocrine Symptoms Reported: No symptoms reported Is patient diabetic?: No    Gastrointestinal Gastrointestinal Symptoms Reported: No symptoms reported      Genitourinary Genitourinary Symptoms Reported: Other Other Genitourinary Symptoms: ESRD dialysis @ [facility] in [place] Genitourinary Management Strategies: Hemodialysis Hemodialysis Schedule: M,W,F Hemodialysis Last Treatment: 06/13/24 Genitourinary Self-Management Outcome: 4 (good) Genitourinary Comment: Reviewed renal diet, fluid restrictions  Integumentary Integumentary Symptoms Reported: No symptoms reported Additional Integumentary Details: no issues with fistula to left arm    Musculoskeletal Musculoskelatal Symptoms Reviewed: No symptoms reported        Psychosocial Psychosocial Symptoms Reported: No symptoms reported         There were no vitals filed for this visit.  Medications Reviewed Today     Reviewed by Aura Mliss DELENA, RN (Registered Nurse) on 06/14/24 at [phone]  Med List Status: <None>   Medication Order Taking? Sig Documenting Provider Last Dose Status Informant  acetaminophen  (TYLENOL ) 500 MG tablet [id number]  Take 1,000 mg by mouth every 6 (six) hours as needed for mild pain (pain score 1-3). [provider]  Active Pharmacy Records, Mother  albuterol  (VENTOLIN  HFA) [phone] Base) MCG/ACT inhaler [id number]  Inhale 2 puffs into the lungs every 6 (six) hours as needed for wheezing or shortness of breath. Del Wilhelmena Lloyd Sola, FNP  Active Pharmacy Records, Mother           Med Note MAZIE, [place]   Thu Apr 07, 2024  9:13 AM)    amLODipine  (NORVASC )  10 MG tablet [id number]  Take 1 tablet (10 mg total) by mouth in the morning. Del Wilhelmena Lloyd Sola, FNP  Active Pharmacy Records,  Mother  aspirin  81 MG chewable tablet [id number]  Chew 1 tablet (81 mg total) by mouth daily. Maree, Pratik D, DO  Active   atorvastatin  (LIPITOR) 40 MG tablet [id number]  Take 1 tablet (40 mg total) by mouth daily. Maree, Pratik D, DO  Active   b complex-vitamin c-folic acid  (NEPHRO-VITE) 0.8 MG TABS tablet [id number]  Take 1 tablet by mouth daily. [provider]  Active Pharmacy Records, Mother  Blood Pressure Monitoring (OMRON 3 SERIES BP MONITOR) DEVI [id number]  Use as directed 2 times a day Del Wilhelmena Lloyd, Sola, FNP  Active Pharmacy Records, Mother  busPIRone  (BUSPAR ) 7.5 MG tablet [id number]  Take 1 tablet (7.5 mg total) by mouth daily. Maree, Pratik D, DO  Active   camphor-menthol  [facility]) lotion [id number]  Apply topically as needed for itching. Tobie Yetta HERO, MD  Active Pharmacy Records, Mother  carvedilol  (COREG ) 12.5 MG tablet [id number]  Take 1 tablet (12.5 mg total) by mouth 2 (two) times daily with a meal. Maree, Pratik D, DO  Active   cloNIDine  (CATAPRES ) 0.3 MG tablet [id number]  Take 1 tablet (0.3 mg total) by mouth 3 (three) times daily. Del Wilhelmena Lloyd Sola, FNP  Active Pharmacy Records, Mother  hydrALAZINE  (APRESOLINE ) 100 MG tablet [id number]  Take 1 tablet (100 mg total) by mouth 3 (three) times daily. Del Wilhelmena Lloyd Sola, FNP  Active Pharmacy Records, Mother  hydrOXYzine  (ATARAX ) 25 MG tablet [id number]  Take 25 mg by mouth every 12 (twelve) hours as needed. [provider]  Active Pharmacy Records, Mother           Med Note DRENA, CHUCK KANDICE Debar May 31, 2024  6:23 PM) Pt's mother was unsure if pt still takes this or not   levETIRAcetam  (KEPPRA ) 250 MG tablet [id number]  Take 2 tablets (500 mg total) by mouth daily AND 1 tablet (250 mg total) every Monday, Wednesday, and Friday with hemodialysis. Maree, Pratik D, DO  Active   losartan  (COZAAR ) 50 MG tablet [id number]  Take 50 mg by mouth at bedtime. [provider]  Active Pharmacy Records, Mother   pantoprazole  (PROTONIX ) 40 MG tablet [id number]  TAKE 1 TABLET BY MOUTH DAILY BEFORE BREAKFAST Del Wilhelmena Lloyd Sola, FNP  Active   Med List Note (Horton, Jeannetta, CPhT 03/23/23 9152): Dialysis tu, th, Sat.            Recommendation:   PCP Follow-up Continue Current Plan of Care  Follow Up Plan:   Telephone follow-up 06/21/24 @ 1015 am  Mliss Creed [facility], BSN RN Care Manager/ Transition of Care [place] [phone]

## 2024-06-14 NOTE — Patient Instructions (Signed)
 Visit Information  Thank you for taking time to visit with me today. Please don't hesitate to contact me if I can be of assistance to you before our next scheduled telephone appointment.  Our next appointment is by telephone on 06/21/24 at 1015 am  Following is a copy of your care plan:   Goals Addressed             This Visit's Progress    VBCI Transitions of Care (TOC) Care Plan       Problems:  Recent Hospitalization for treatment of lupus, ESRD, new onset of seizures  Functional/Safety concern: new onset of seizure.  Encouraged patient not to drive until cleared by MD,  no complaint of headaches, no seizure activity reported. Patient states she has a blood pressure cuff and will start checking blood pressure today and keeping a log, pt reports she has all medications and taking as prescribed, continues dialysis M,W,F  Goal:  Over the next 30 days, the patient will not experience hospital readmission  Interventions:  Transitions of Care: Doctor Visits  - discussed the importance of doctor visits Call MD for changes in condition Reviewed follow up with primary care provider on 06/16/24 @ 140 pm   Reviewed new patient appointment with rheumatology  Evaluation of current treatment plan related to seizures,  self-management and patient's adherence to plan as established by provider. Reinforced plans with patient for ongoing care management follow up and provided patient with direct contact information for care management team Advised patient to seek medical attention for an additional seizure. Reinforced with patient- do not drive until cleared by MD since she has new onset of seizure Reviewed medications with patient and discussed.  Importance of not missing any medications.  Reviewed importance of monitoring blood pressure and keeping a log, taking log to provider appointments  Reinforced importance of not missing any dialysis sessions.  Reviewed renal diet, fluid  restrictions  Patient Self Care Activities:  Attend all scheduled provider appointments Call pharmacy for medication refills 3-7 days in advance of running out of medications Call provider office for new concerns or questions  Notify RN Care Manager of TOC call rescheduling needs Participate in Transition of Care Program/Attend TOC scheduled calls Perform all self care activities independently  Take medications as prescribed   Seek emergency care for seizure Practice safety and avoid falls Attend dialysis as planned every MWF Do not drive until cleared by MD Check blood pressure and keep a log  Plan:  Telephone follow up appointment with care management team member scheduled for:  06/21/24 @ 1015 am with Alan Ee RN The patient has been provided with contact information for the care management team and has been advised to call with any health related questions or concerns.         Patient verbalizes understanding of instructions and care plan provided today and agrees to view in MyChart. Active MyChart status and patient understanding of how to access instructions and care plan via MyChart confirmed with patient.     Telephone follow up appointment with care management team member scheduled for: 06/21/24 @ 1015 am  Please call the care guide team at [phone] if you need to cancel or reschedule your appointment.   Please call the Suicide and Crisis Lifeline: 988 call the [place]  National Suicide Prevention Lifeline: 385-269-3186 or TTY: 530-324-2011 TTY (587)215-7583) to talk to a trained counselor call 1-800-273-TALK (toll free, 24 hour hotline) go to [facility] [address]  [address], [place] [phone]) call the [facility] Line: [phone] call 911 if you are experiencing a Mental Health or Behavioral Health Crisis or need someone to talk to.  Mliss Creed [facility], BSN RN Care Manager/ Transition of Care / [facility] [phone]

## 2024-06-15 DIAGNOSIS — G459 Transient cerebral ischemic attack, unspecified: Secondary | ICD-10-CM | POA: Diagnosis not present

## 2024-06-16 ENCOUNTER — Encounter: Payer: Self-pay | Admitting: Family Medicine

## 2024-06-16 ENCOUNTER — Ambulatory Visit: Admitting: Family Medicine

## 2024-06-16 VITALS — BP 138/86 | HR 54 | Ht 67.0 in | Wt 146.0 lb

## 2024-06-16 DIAGNOSIS — G479 Sleep disorder, unspecified: Secondary | ICD-10-CM | POA: Diagnosis not present

## 2024-06-16 DIAGNOSIS — I151 Hypertension secondary to other renal disorders: Secondary | ICD-10-CM

## 2024-06-16 DIAGNOSIS — K047 Periapical abscess without sinus: Secondary | ICD-10-CM | POA: Diagnosis not present

## 2024-06-16 DIAGNOSIS — R7301 Impaired fasting glucose: Secondary | ICD-10-CM | POA: Diagnosis not present

## 2024-06-16 MED ORDER — AMLODIPINE BESYLATE 10 MG PO TABS: 10.0000 mg | ORAL_TABLET | Freq: Every morning | ORAL | 3 refills | Status: DC

## 2024-06-16 MED ORDER — CLONIDINE HCL 0.3 MG PO TABS: 0.3000 mg | ORAL_TABLET | Freq: Two times a day (BID) | ORAL | 3 refills | Status: DC

## 2024-06-16 MED ORDER — LIDOCAINE VISCOUS HCL 2 % MT SOLN: 15.0000 mL | OROMUCOSAL | 0 refills | Status: DC | PRN

## 2024-06-16 MED ORDER — AMOXICILLIN 500 MG PO CAPS: 500.0000 mg | ORAL_CAPSULE | Freq: Two times a day (BID) | ORAL | 0 refills | Status: AC

## 2024-06-16 MED ORDER — HYDRALAZINE HCL 100 MG PO TABS: 100.0000 mg | ORAL_TABLET | Freq: Three times a day (TID) | ORAL | 3 refills | Status: DC

## 2024-06-16 MED ORDER — GABAPENTIN 300 MG PO CAPS: 300.0000 mg | ORAL_CAPSULE | Freq: Every day | ORAL | 3 refills | Status: DC

## 2024-06-16 MED ORDER — CARVEDILOL 12.5 MG PO TABS: 12.5000 mg | ORAL_TABLET | Freq: Two times a day (BID) | ORAL | 3 refills | Status: DC

## 2024-06-16 MED ORDER — LOSARTAN POTASSIUM 50 MG PO TABS: 50.0000 mg | ORAL_TABLET | Freq: Every day | ORAL | 3 refills | Status: DC

## 2024-06-16 NOTE — Progress Notes (Signed)
 Established Patient Office Visit   Subjective  Patient ID: Cathy [name], female    DOB: 17-Dec-1980  Age: 43 y.o. MRN: [id number]  Chief Complaint  Patient presents with   Care Management    Four month follow up    Hypertension    Follow up    Dental Pain    Abscess, can not get in to the dentist until next week     She  has a past medical history of Anasarca associated with disorder of kidney (06/08/2021), Anxiety, Asthma, Bipolar depression (HCC), Cirrhosis (HCC), Depression, Dyspnea, ESRD (end stage renal disease) on dialysis (HCC) (10/2020), GERD (gastroesophageal reflux disease), Gonorrhea, Lupus, Pelvic inflammatory disease (PID), Pleural effusion (06/08/2021), Pulmonary embolism (HCC) (01/31/2022), Renal hypertension, and Trichomonas infection.  HPI Patient presents to the clinic for chronic follow up. For the details of today's visit, please refer to assessment and plan.   Review of Systems  Constitutional:  Negative for chills and fever.  Respiratory:  Negative for shortness of breath.   Cardiovascular:  Negative for chest pain.  Neurological:  Negative for dizziness.  Psychiatric/Behavioral:  The patient has insomnia.       Objective:     BP 138/86   Pulse (!) 54   Ht 5' 7 (1.702 m)   Wt 146 lb (66.2 kg)   LMP 04/07/2020 (Within Months)   SpO2 94%   BMI 22.87 kg/m  BP Readings from Last 3 Encounters:  06/16/24 138/86  06/03/24 (!) 108/55  05/10/24 (!) 102/57      Physical Exam Vitals reviewed.  Constitutional:      General: She is not in acute distress.    Appearance: Normal appearance. She is not ill-appearing, toxic-appearing or diaphoretic.  HENT:     Head: Normocephalic.  Eyes:     General:        Right eye: No discharge.        Left eye: No discharge.     Conjunctiva/sclera: Conjunctivae normal.  Cardiovascular:     Rate and Rhythm: Normal rate.     Pulses: Normal pulses.     Heart sounds: Normal heart sounds.  Pulmonary:      Effort: Pulmonary effort is normal. No respiratory distress.     Breath sounds: Normal breath sounds.  Skin:    General: Skin is warm and dry.  Neurological:     Mental Status: She is alert.  Psychiatric:        Mood and Affect: Mood normal.        Behavior: Behavior normal.        Thought Content: Thought content normal.      No results found for any visits on 06/16/24.  The ASCVD Risk score (Arnett DK, et al., 2019) failed to calculate for the following reasons:   Risk score cannot be calculated because patient has a medical history suggesting prior/existing ASCVD    Assessment & Plan:  IFG (impaired fasting glucose) -     Hemoglobin A1c  Dental abscess Assessment & Plan: Started antibiotic treatment with Amoxicillin  500 mg twice daily for 7 days. Oral Lidocaine  PRN The patient requested a referral to a dentist, which has been placed. Encourage the patient to maintain good oral hygiene, including warm salt water  rinses and avoiding sugary foods to reduce bacterial growth and discomfort. Advise Over-the-counter pain relievers may help manage pain until dental evaluation.  Orders: -     Ambulatory referral to Dentistry  Hypertension secondary to other renal disorders Assessment &  Plan: Vitals:   06/16/24 1326 06/16/24 1336  BP: (!) 149/80 138/86    Continue  current Regimen: amlodipine  10 mg daily, carvedilol  25 mg twice daily, clonidine  0.3 mg 2 twice daily, Hydralazine  100 mg 3 times daily.  Continued discussion on DASH diet, low sodium diet and maintain a exercise routine for 150 minutes per week.  Follow up with Cardiology appointment next month    Difficulty sleeping Assessment & Plan: Trial on Gabapentin  300 mg at bedtime Explained to go to bed at the same time each night and get up at the same time each morning, including on the weekends. Make sure your bedroom is quiet, dark, relaxing, and at a comfortable temperature. Remove electronic devices, such as TVs,  computers, and smart phones, from the bedroom.    Other orders -     Amoxicillin ; Take 1 capsule (500 mg total) by mouth every 12 (twelve) hours for 7 days.  Dispense: 14 capsule; Refill: 0 -     Lidocaine  Viscous HCl; Use as directed 15 mLs in the mouth or throat as needed.  Dispense: 100 mL; Refill: 0 -     Gabapentin ; Take 1 capsule (300 mg total) by mouth at bedtime.  Dispense: 30 capsule; Refill: 3 -     amLODIPine  Besylate; Take 1 tablet (10 mg total) by mouth in the morning.  Dispense: 30 tablet; Refill: 3 -     Carvedilol ; Take 1 tablet (12.5 mg total) by mouth 2 (two) times daily with a meal.  Dispense: 60 tablet; Refill: 3 -     cloNIDine  HCl; Take 1 tablet (0.3 mg total) by mouth 2 (two) times daily.  Dispense: 60 tablet; Refill: 3 -     Losartan  Potassium; Take 1 tablet (50 mg total) by mouth at bedtime.  Dispense: 30 tablet; Refill: 3 -     hydrALAZINE  HCl; Take 1 tablet (100 mg total) by mouth 3 (three) times daily.  Dispense: 90 tablet; Refill: 3    Return in about 4 months (around 10/17/2024), or if symptoms worsen or fail to improve, for chronic follow-up.   Hilario Kidd Wilhelmena Falter, FNP

## 2024-06-16 NOTE — Assessment & Plan Note (Signed)
 Vitals:   06/16/24 1326 06/16/24 1336  BP: (!) 149/80 138/86    Continue  current Regimen: amlodipine  10 mg daily, carvedilol  25 mg twice daily, clonidine  0.3 mg 2 twice daily, Hydralazine  100 mg 3 times daily.  Continued discussion on DASH diet, low sodium diet and maintain a exercise routine for 150 minutes per week.  Follow up with Cardiology appointment next month

## 2024-06-16 NOTE — Assessment & Plan Note (Signed)
 Trial on Gabapentin 300 mg at bedtime Explained to go to bed at the same time each night and get up at the same time each morning, including on the weekends. Make sure your bedroom is quiet, dark, relaxing, and at a comfortable temperature. Remove electronic devices, such as TVs, computers, and smart phones, from the bedroom.

## 2024-06-16 NOTE — Assessment & Plan Note (Signed)
 Started antibiotic treatment with Amoxicillin  500 mg twice daily for 7 days. Oral Lidocaine  PRN The patient requested a referral to a dentist, which has been placed. Encourage the patient to maintain good oral hygiene, including warm salt water  rinses and avoiding sugary foods to reduce bacterial growth and discomfort. Advise Over-the-counter pain relievers may help manage pain until dental evaluation.

## 2024-06-16 NOTE — Patient Instructions (Addendum)
           Great to see you today.  I have refilled the medication(s) we provide.   If labs were collected, we will inform you of lab results once received either by echart message or telephone call.   - echart message- for normal results that have been seen by the patient already.   - telephone call: abnormal results or if patient has not viewed results in their echart.   - Please take medications as prescribed. - Follow up with your primary health provider if any health concerns arises. - If symptoms worsen please contact your primary care provider and/or visit the emergency department.    Cardiology: [facility] at [facility] [address] [place],  [place]  [address]     GI: [facility] at [facility] [address] [place], [place] [address] [phone]

## 2024-06-17 DIAGNOSIS — N186 End stage renal disease: Secondary | ICD-10-CM | POA: Diagnosis not present

## 2024-06-17 DIAGNOSIS — Z992 Dependence on renal dialysis: Secondary | ICD-10-CM | POA: Diagnosis not present

## 2024-06-18 LAB — HEMOGLOBIN A1C
Est. average glucose Bld gHb Est-mCnc: 108 mg/dL
Hgb A1c MFr Bld: 5.4 % (ref 4.8–5.6)

## 2024-06-21 ENCOUNTER — Telehealth: Payer: Self-pay

## 2024-06-22 ENCOUNTER — Telehealth: Payer: Self-pay

## 2024-06-22 NOTE — Patient Instructions (Signed)
 Visit Information  Thank you for taking time to visit with me today. Please don't hesitate to contact me if I can be of assistance to you before our next scheduled telephone appointment.  Our next appointment is by telephone on 06/29/2024 at 1000  Following is a copy of your care plan:   Goals Addressed             This Visit's Progress    [facility] Transitions of Care (TOC) Care Plan       Problems:  Recent Hospitalization for treatment of lupus, ESRD, new onset of seizures   06/22/2024 Denies any seizure but reports that she has hands, arms and leg shaking daily. Reports shaking happens all day long.  Functional/Safety concern: new onset of seizure.  Encouraged patient not to drive until cleared by MD,  no complaint of headaches, no seizure activity reported. Patient states she has a blood pressure cuff and will start checking blood pressure today and keeping a log, pt reports she has all medications and taking as prescribed, continues dialysis M,W,F.  06/22/2024  Reports BP is high  176/100 right now at dialysis. Mother is monitoring daily at home.   Goal:  Over the next 30 days, the patient will not experience hospital readmission  Interventions:  Transitions of Care: Doctor Visits  - discussed the importance of doctor visits Call MD for changes in condition Reviewed new patient appointment with rheumatology  Evaluation of current treatment plan related to seizures,  self-management and patient's adherence to plan as established by provider. Reinforced plans with patient for ongoing care management follow up and provided patient with direct contact information for care management team Advised patient to seek medical attention for an additional seizure. Reinforced with patient- do not drive until cleared by MD since she has new onset of seizure Reviewed medications with patient and discussed.  Importance of not missing any medications.  Placed call to [facility] [address] and spoke with  Beverley who states pharmacy has questions about instructions.  Reports they sent a fax numerous times without a response.  I read the discharge summary to pharmacist from discharging MD.  Pharmacist voices he understands now and will fill RX.  I informed pharmacist that I am not providing a verbal order, that I am reading from patients EMR. Beverley voiced understanding.  Reviewed importance of monitoring blood pressure and keeping a log, taking log to provider appointments  Reinforced importance of not missing any dialysis sessions.  Reviewed renal diet, fluid restrictions  Patient Self Care Activities:  Attend all scheduled provider appointments Call pharmacy for medication refills 3-7 days in advance of running out of medications Call provider office for new concerns or questions  Notify RN Care Manager of TOC call rescheduling needs Participate in Transition of Care Program/Attend TOC scheduled calls Perform all self care activities independently  Take medications as prescribed   Seek emergency care for seizure Practice safety and avoid falls Attend dialysis as planned every MWF Do not drive until cleared by MD Check blood pressure and keep a log Pick up Keppra  today and start taking as prescribed.   Plan:  Telephone follow up appointment with care management team member scheduled for:  06/29/2024 @ 1000am with Alan Ee RN The patient has been provided with contact information for the care management team and has been advised to call with any health related questions or concerns.         Patient verbalizes understanding of instructions and care plan  provided today and agrees to view in MyChart. Active MyChart status and patient understanding of how to access instructions and care plan via MyChart confirmed with patient.     Telephone follow up appointment with care management team member scheduled for:   06/29/2024  at 10am  Please call the care guide team at [phone] if you need  to cancel or reschedule your appointment.   Please call the Suicide and Crisis Lifeline: 988 call the [place]  National Suicide Prevention Lifeline: (404)253-9326 or TTY: (430)486-1793 TTY 515-432-7587) to talk to a trained counselor call 1-800-273-TALK (toll free, 24 hour hotline) call 911 if you are experiencing a Mental Health or Behavioral Health Crisis or need someone to talk to.  Alan Ee, RN, BSN, CEN [employer]- Transition of Care Team.  [facility] [phone]

## 2024-06-22 NOTE — Transitions of Care (Post Inpatient/ED Visit) (Signed)
 Transition of Care week 3  Visit Note  06/22/2024  Name: Cathy [name] MRN: [id number]          DOB: 1981-06-30  Situation: Patient enrolled in [facility] 30-day program. Visit completed with pt by telephone.   Background:   Initial Transition Care Management Follow-up Telephone Call    Past Medical History:  Diagnosis Date   Anasarca associated with disorder of kidney 06/08/2021   Anxiety    Asthma    Bipolar depression (HCC)    Cirrhosis (HCC)    Depression    Dyspnea    ESRD (end stage renal disease) on dialysis (HCC) 10/2020   GERD (gastroesophageal reflux disease)    Gonorrhea    Lupus    Pelvic inflammatory disease (PID)    Pleural effusion 06/08/2021   Pulmonary embolism (HCC) 01/31/2022   Renal hypertension    Trichomonas infection     Assessment: Patient reports that she is at dialysis. Reports that she has had some shaking of her hand, arm and legs.  Denies taking her Keppra  and reports she never got from pharmacy.  Denies any seizures.  Appears awake and alert during call at dialysis center.  Patient Reported Symptoms: Cognitive Cognitive Status: Able to follow simple commands, Alert and oriented to person, place, and time, Normal speech and language skills      Neurological Neurological Review of Symptoms: Other: [place] Neurological Symptoms/Conditions [RPT]: Reports continous shaking.  denies seizure and does not want to go to the hospital. Neurological Management Strategies: Medication therapy Neurological Self-Management Outcome: 3 (uncertain) Neurological Comment: Reviewed with patient that she is supposed to be taking Keppra . Patient reprorts to me that she never got RX.  Phone call with pharmacist and RX will be filled today.  HEENT HEENT Symptoms Reported: No symptoms reported      Cardiovascular Cardiovascular Symptoms Reported: No symptoms reported    Respiratory Respiratory Symptoms Reported: No symptoms reported    Endocrine Endocrine Symptoms  Reported: No symptoms reported Is patient diabetic?: No    Gastrointestinal Gastrointestinal Symptoms Reported: No symptoms reported      Genitourinary Genitourinary Symptoms Reported: Other Other Genitourinary Symptoms: Currently at dialysis during this call.    Integumentary Integumentary Symptoms Reported: No symptoms reported    Musculoskeletal Musculoskelatal Symptoms Reviewed: No symptoms reported        Psychosocial Psychosocial Symptoms Reported: No symptoms reported         Vitals:   06/22/24 1244  BP: (!) 176/100    Medications Reviewed Today     Reviewed by Rumalda Alan PENNER, RN (Registered Nurse) on 06/22/24 at 1217  Med List Status: <None>   Medication Order Taking? Sig Documenting Provider Last Dose Status Informant  acetaminophen  (TYLENOL ) 500 MG tablet [id number] Yes Take 1,000 mg by mouth every 6 (six) hours as needed for mild pain (pain score 1-3). [provider]  Active Pharmacy Records, Mother  albuterol  (VENTOLIN  HFA) 108 [phone] Base) MCG/ACT inhaler [id number] Yes Inhale 2 puffs into the lungs every 6 (six) hours as needed for wheezing or shortness of breath. Del Wilhelmena Lloyd Sola, FNP  Active Pharmacy Records, Mother           Med Note MAZIE, [place]   Thu Apr 07, 2024  9:13 AM)    amLODipine  (NORVASC ) 10 MG tablet [id number] Yes Take 1 tablet (10 mg total) by mouth in the morning. Del Wilhelmena Lloyd, Sola, FNP  Active   amoxicillin  (AMOXIL ) 500 MG capsule [id number] Yes Take 1 capsule (  500 mg total) by mouth every 12 (twelve) hours for 7 days. Del Wilhelmena Falter, Hilario, FNP  Active   aspirin  81 MG chewable tablet [id number] Yes Chew 1 tablet (81 mg total) by mouth daily. Maree, Pratik D, DO  Active   atorvastatin  (LIPITOR) 40 MG tablet [id number] Yes Take 1 tablet (40 mg total) by mouth daily. Maree, Pratik D, DO  Active   b complex-vitamin c-folic acid  (NEPHRO-VITE) 0.8 MG TABS tablet [id number] Yes Take 1 tablet by mouth daily. [provider]   Active Pharmacy Records, Mother  Blood Pressure Monitoring (OMRON 3 SERIES BP MONITOR) DEVI [id number] Yes Use as directed 2 times a day Del Wilhelmena Falter, Hilario, FNP  Active Pharmacy Records, Mother  busPIRone  (BUSPAR ) 7.5 MG tablet [id number] Yes Take 1 tablet (7.5 mg total) by mouth daily. Maree, Pratik D, DO  Active   camphor-menthol  VIKKI) lotion [id number] Yes Apply topically as needed for itching. Tobie Yetta HERO, MD  Active Pharmacy Records, Mother  carvedilol  (COREG ) 12.5 MG tablet [id number] Yes Take 1 tablet (12.5 mg total) by mouth 2 (two) times daily with a meal. Del Wilhelmena Falter, Matthews, FNP  Active   cloNIDine  (CATAPRES ) 0.3 MG tablet [id number] Yes Take 1 tablet (0.3 mg total) by mouth 2 (two) times daily. Del Wilhelmena Falter, Hilario, FNP  Active   gabapentin  (NEURONTIN ) 300 MG capsule [id number] Yes Take 1 capsule (300 mg total) by mouth at bedtime. Del Wilhelmena Falter, Hilario, FNP  Active   hydrALAZINE  (APRESOLINE ) 100 MG tablet [id number] Yes Take 1 tablet (100 mg total) by mouth 3 (three) times daily. Del Orbe Polanco, Hilario, FNP  Active   hydrOXYzine  (ATARAX ) 25 MG tablet [id number] Yes Take 25 mg by mouth every 12 (twelve) hours as needed. [provider]  Active Pharmacy Records, Mother           Med Note DRENA, CHUCK KANDICE Debar May 31, 2024  6:23 PM) Pt's mother was unsure if pt still takes this or not   levETIRAcetam  (KEPPRA ) 250 MG tablet [id number] Yes Take 2 tablets (500 mg total) by mouth daily AND 1 tablet (250 mg total) every Monday, Wednesday, and Friday with hemodialysis. Maree, Pratik D, DO  Active   lidocaine  (XYLOCAINE ) 2 % solution [id number] Yes Use as directed 15 mLs in the mouth or throat as needed. Del Orbe Polanco, Hilario, FNP  Active   losartan  (COZAAR ) 50 MG tablet [id number] Yes Take 1 tablet (50 mg total) by mouth at bedtime. Del Orbe Polanco, Hilario, FNP  Active   pantoprazole  (PROTONIX ) 40 MG tablet [id number] Yes TAKE 1 TABLET BY MOUTH DAILY BEFORE BREAKFAST Del  Wilhelmena Falter Hilario, FNP  Active   Med List Note Christie Alexander, CPhT 03/23/23 9152): Dialysis tu, th, Sat.            Goals      [facility]BCI Transitions of Care (TOC) Care Plan     Problems:  Recent Hospitalization for treatment of lupus, ESRD, new onset of seizures   06/22/2024 Denies any seizure but reports that she has hands, arms and leg shaking daily. Reports shaking happens all day long.  Functional/Safety concern: new onset of seizure.  Encouraged patient not to drive until cleared by MD,  no complaint of headaches, no seizure activity reported. Patient states she has a blood pressure cuff and will start checking blood pressure today and keeping a log, pt reports she has all medications and taking as prescribed, continues dialysis M,W,F.  06/22/2024  Reports  BP is high  176/100 right now at dialysis. Mother is monitoring daily at home.   Goal:  Over the next 30 days, the patient will not experience hospital readmission  Interventions:  Transitions of Care: Doctor Visits  - discussed the importance of doctor visits Call MD for changes in condition Reviewed new patient appointment with rheumatology  Evaluation of current treatment plan related to seizures,  self-management and patient's adherence to plan as established by provider. Reinforced plans with patient for ongoing care management follow up and provided patient with direct contact information for care management team Advised patient to seek medical attention for an additional seizure. Reinforced with patient- do not drive until cleared by MD since she has new onset of seizure Reviewed medications with patient and discussed.  Importance of not missing any medications.  [facility] [address] and spoke with Beverley who states pharmacy has questions about instructions.  Reports they sent a fax numerous times without a response.  I read the discharge summary to pharmacist from discharging MD.  Pharmacist voices he  understands now and will fill RX.  I informed pharmacist that I am not providing a verbal order, that I am reading from patients EMR. Beverley voiced understanding.  Reviewed importance of monitoring blood pressure and keeping a log, taking log to provider appointments  Reinforced importance of not missing any dialysis sessions.  Reviewed renal diet, fluid restrictions  Patient Self Care Activities:  Attend all scheduled provider appointments Call pharmacy for medication refills 3-7 days in advance of running out of medications Call provider office for new concerns or questions  Notify RN Care Manager of TOC call rescheduling needs Participate in Transition of Care Program/Attend TOC scheduled calls Perform all self care activities independently  Take medications as prescribed   Seek emergency care for seizure Practice safety and avoid falls Attend dialysis as planned every MWF Do not drive until cleared by MD Check blood pressure and keep a log Pick up Keppra  today and start taking as prescribed.   Plan:  Telephone follow up appointment with care management team member scheduled for:  06/29/2024 @ 1000am with Alan Ee RN The patient has been provided with contact information for the care management team and has been advised to call with any health related questions or concerns.         Recommendation:   Continue Current Plan of Care Pick up Keppra  today and start taking as prescribed.   Follow Up Plan:   Telephone follow up appointment date/time:  06/29/2024 at 10 am  Alan Ee, RN, BSN, [employer]- Transition of Care Team.  [facility] [phone]

## 2024-06-24 ENCOUNTER — Encounter: Payer: Self-pay | Admitting: Gastroenterology

## 2024-06-25 DIAGNOSIS — N186 End stage renal disease: Secondary | ICD-10-CM | POA: Diagnosis not present

## 2024-06-25 DIAGNOSIS — Z992 Dependence on renal dialysis: Secondary | ICD-10-CM | POA: Diagnosis not present

## 2024-06-27 ENCOUNTER — Other Ambulatory Visit (HOSPITAL_COMMUNITY): Payer: Self-pay | Admitting: Nurse Practitioner

## 2024-06-27 DIAGNOSIS — N186 End stage renal disease: Secondary | ICD-10-CM | POA: Diagnosis not present

## 2024-06-27 DIAGNOSIS — Z992 Dependence on renal dialysis: Secondary | ICD-10-CM | POA: Diagnosis not present

## 2024-06-29 ENCOUNTER — Other Ambulatory Visit: Payer: Self-pay

## 2024-06-29 NOTE — Patient Instructions (Signed)
 Visit Information  Thank you for taking time to visit with me today. Please don't hesitate to contact me if I can be of assistance to you before our next scheduled telephone appointment.  Our next appointment is by telephone on 08/07/025 at 11 am  Following is a copy of your care plan:   Goals Addressed             This Visit's Progress    [facility] Transitions of Care (TOC) Care Plan       Problems:  Recent Hospitalization for treatment of lupus, ESRD, new onset of seizures   06/22/2024 Denies any seizure but reports that she has hands, arms and leg shaking daily. Reports shaking happens all day long. 06/29/2024  Patient reports that she is doing well. No seizures. Reports that she picked up her keppra  and is taking as prescribed.  Reports decrease in  shaking episodes  Functional/Safety concern: new onset of seizure.  Encouraged patient not to drive until cleared by MD,  no complaint of headaches, no seizure activity reported. 06/29/2024   denies any concerns at this time Patient states she has a blood pressure cuff and will start checking blood pressure today and keeping a log, pt reports she has all medications and taking as prescribed, continues dialysis M,W,F.  06/22/2024  Reports BP is high  176/100 right now at dialysis. Mother is monitoring daily at home. 06/29/2024  Patient reports that she is sleepy and has not checked today.   Goal:  Over the next 30 days, the patient will not experience hospital readmission  Interventions:  Transitions of Care: Doctor Visits  - discussed the importance of doctor visits Call MD for changes in condition Reviewed new patient appointment with rheumatology. Reviewed date and time. Encouraged patient to cancel if she is not able to attend.  Evaluation of current treatment plan related to seizures,  self-management and patient's adherence to plan as established by provider. Reinforced plans with patient for ongoing care management follow up and  provided patient with direct contact information for care management team Advised patient to seek medical attention for an additional seizure. Reinforced with patient- do not drive until cleared by MD since she has new onset of seizure Reviewed medications with patient and discussed.  Importance of not missing any medications.  Reviewed importance of monitoring blood pressure and keeping a log, taking log to provider appointments  Reinforced importance of not missing any dialysis sessions.  Reviewed renal diet, fluid restrictions  Patient Self Care Activities:  Attend all scheduled provider appointments Call pharmacy for medication refills 3-7 days in advance of running out of medications Call provider office for new concerns or questions  Notify RN Care Manager of TOC call rescheduling needs Participate in Transition of Care Program/Attend TOC scheduled calls Perform all self care activities independently  Take medications as prescribed   Seek emergency care for seizure Practice safety and avoid falls Attend dialysis as planned every MWF Do not drive until cleared by MD Check blood pressure and keep a log Attended your appointments next week (IR and rheumatology)    Plan:  Telephone follow up appointment with care management team member scheduled for:  07/07/2024 @ 1100am with Alan Ee RN The patient has been provided with contact information for the care management team and has been advised to call with any health related questions or concerns.         Patient verbalizes understanding of instructions and care plan provided today and agrees to  view in MyChart. Active MyChart status and patient understanding of how to access instructions and care plan via MyChart confirmed with patient.     Telephone follow up appointment with care management team member scheduled for:  07/07/2024  Please call the care guide team at [phone] if you need to cancel or reschedule your  appointment.   Please call the Suicide and Crisis Lifeline: 988 call the [place]  National Suicide Prevention Lifeline: (830) 699-5975 or TTY: 786-877-6155 TTY 2791408991) to talk to a trained counselor call 1-800-273-TALK (toll free, 24 hour hotline) call 911 if you are experiencing a Mental Health or Behavioral Health Crisis or need someone to talk to.  Alan Ee, RN, BSN, CEN [employer]- Transition of Care Team.  [facility] [phone]

## 2024-06-29 NOTE — Transitions of Care (Post Inpatient/ED Visit) (Signed)
 Transition of Care week 4  Visit Note  06/29/2024  Name: Cathy [name] MRN: [id number]          DOB: 1981-09-01  Situation: Patient enrolled in [facility] 30-day program. Visit completed with patient by telephone.   Background:   Initial Transition Care Management Follow-up Telephone Call    Past Medical History:  Diagnosis Date   Anasarca associated with disorder of kidney 06/08/2021   Anxiety    Asthma    Bipolar depression (HCC)    Cirrhosis (HCC)    Depression    Dyspnea    ESRD (end stage renal disease) on dialysis (HCC) 10/2020   GERD (gastroesophageal reflux disease)    Gonorrhea    Lupus    Pelvic inflammatory disease (PID)    Pleural effusion 06/08/2021   Pulmonary embolism (HCC) 01/31/2022   Renal hypertension    Trichomonas infection     Assessment: Patient reports that she is doing well. Denies any new problems or concerns. Is not taking her Keppra  as prescribed.  Patient Reported Symptoms: Cognitive Cognitive Status: Able to follow simple commands, Alert and oriented to person, place, and time, Normal speech and language skills      Neurological Neurological Review of Symptoms: Other: Oher Neurological Symptoms/Conditions [RPT]: decrease in shaking episodes    HEENT HEENT Symptoms Reported: No symptoms reported      Cardiovascular Cardiovascular Symptoms Reported: No symptoms reported    Respiratory Respiratory Symptoms Reported: No symptoms reported    Endocrine Endocrine Symptoms Reported: No symptoms reported Is patient diabetic?: No    Gastrointestinal Gastrointestinal Symptoms Reported: No symptoms reported      Genitourinary Genitourinary Symptoms Reported: Other Other Genitourinary Symptoms: Current dialysis MWF    Integumentary Integumentary Symptoms Reported: No symptoms reported    Musculoskeletal Musculoskelatal Symptoms Reviewed: No symptoms reported        Psychosocial Psychosocial Symptoms Reported: No symptoms reported          There were no vitals filed for this visit.  Medications Reviewed Today     Reviewed by Rumalda Alan PENNER, RN (Registered Nurse) on 06/29/24 at 1032  Med List Status: <None>   Medication Order Taking? Sig Documenting Provider Last Dose Status Informant  acetaminophen  (TYLENOL ) 500 MG tablet [id number] Yes Take 1,000 mg by mouth every 6 (six) hours as needed for mild pain (pain score 1-3). [provider]  Active Pharmacy Records, Mother  albuterol  (VENTOLIN  HFA) 108 [phone] Base) MCG/ACT inhaler [id number] Yes Inhale 2 puffs into the lungs every 6 (six) hours as needed for wheezing or shortness of breath. Del Wilhelmena Lloyd Sola, FNP  Active Pharmacy Records, Mother           Med Note MAZIE, [place]   Thu Apr 07, 2024  9:13 AM)    amLODipine  (NORVASC ) 10 MG tablet [id number] Yes Take 1 tablet (10 mg total) by mouth in the morning. Del Wilhelmena Lloyd Sola, FNP  Active   aspirin  81 MG chewable tablet [id number] Yes Chew 1 tablet (81 mg total) by mouth daily. Maree, Pratik D, DO  Active   atorvastatin  (LIPITOR) 40 MG tablet [id number] Yes Take 1 tablet (40 mg total) by mouth daily. Maree, Pratik D, DO  Active   b complex-vitamin c-folic acid  (NEPHRO-VITE) 0.8 MG TABS tablet [id number] Yes Take 1 tablet by mouth daily. [provider]  Active Pharmacy Records, Mother  Blood Pressure Monitoring (OMRON 3 SERIES BP MONITOR) DEVI [id number] Yes Use as directed 2 times a day Del Orbe  Polanco, Iliana, FNP  Active Pharmacy Records, Mother  busPIRone  (BUSPAR ) 7.5 MG tablet [id number] Yes Take 1 tablet (7.5 mg total) by mouth daily. Maree, Pratik D, DO  Active   camphor-menthol  VIKKI) lotion [id number] Yes Apply topically as needed for itching. Tobie Yetta HERO, MD  Active Pharmacy Records, Mother  carvedilol  (COREG ) 12.5 MG tablet [id number] Yes Take 1 tablet (12.5 mg total) by mouth 2 (two) times daily with a meal. Del Wilhelmena Falter, Hilario, FNP  Active   cloNIDine  (CATAPRES ) 0.3 MG tablet [id number] Yes  Take 1 tablet (0.3 mg total) by mouth 2 (two) times daily. Del Orbe Polanco, Hilario, FNP  Active   gabapentin  (NEURONTIN ) 300 MG capsule [id number] Yes Take 1 capsule (300 mg total) by mouth at bedtime. Del Orbe Polanco, Hilario, FNP  Active   hydrALAZINE  (APRESOLINE ) 100 MG tablet [id number] Yes Take 1 tablet (100 mg total) by mouth 3 (three) times daily. Del Orbe Polanco, Hilario, FNP  Active   hydrOXYzine  (ATARAX ) 25 MG tablet [id number] Yes Take 25 mg by mouth every 12 (twelve) hours as needed. [provider]  Active Pharmacy Records, Mother           Med Note DRENA, CHUCK KANDICE Debar May 31, 2024  6:23 PM) Pt's mother was unsure if pt still takes this or not   levETIRAcetam  (KEPPRA ) 250 MG tablet [id number] Yes Take 2 tablets (500 mg total) by mouth daily AND 1 tablet (250 mg total) every Monday, Wednesday, and Friday with hemodialysis. Maree, Pratik D, DO  Active   lidocaine  (XYLOCAINE ) 2 % solution [id number] Yes Use as directed 15 mLs in the mouth or throat as needed. Del Orbe Polanco, Hilario, FNP  Active   losartan  (COZAAR ) 50 MG tablet [id number] Yes Take 1 tablet (50 mg total) by mouth at bedtime. Del Orbe Polanco, Hilario, FNP  Active   pantoprazole  (PROTONIX ) 40 MG tablet [id number] Yes TAKE 1 TABLET BY MOUTH DAILY BEFORE BREAKFAST Del Wilhelmena Falter Hilario, FNP  Active   Med List Note Christie Alexander, CPhT 03/23/23 9152): Dialysis tu, th, Sat.            Goals      VBCI Transitions of Care (TOC) Care Plan     Problems:  Recent Hospitalization for treatment of lupus, ESRD, new onset of seizures   06/22/2024 Denies any seizure but reports that she has hands, arms and leg shaking daily. Reports shaking happens all day long. 06/29/2024  Patient reports that she is doing well. No seizures. Reports that she picked up her keppra  and is taking as prescribed.  Reports decrease in  shaking episodes  Functional/Safety concern: new onset of seizure.  Encouraged patient not to drive until  cleared by MD,  no complaint of headaches, no seizure activity reported. 06/29/2024   denies any concerns at this time Patient states she has a blood pressure cuff and will start checking blood pressure today and keeping a log, pt reports she has all medications and taking as prescribed, continues dialysis M,W,F.  06/22/2024  Reports BP is high  176/100 right now at dialysis. Mother is monitoring daily at home. 06/29/2024  Patient reports that she is sleepy and has not checked today.   Goal:  Over the next 30 days, the patient will not experience hospital readmission  Interventions:  Transitions of Care: Doctor Visits  - discussed the importance of doctor visits Call MD for changes in condition Reviewed new patient appointment with rheumatology. Reviewed date and time.  Encouraged patient to cancel if she is not able to attend.  Evaluation of current treatment plan related to seizures,  self-management and patient's adherence to plan as established by provider. Reinforced plans with patient for ongoing care management follow up and provided patient with direct contact information for care management team Advised patient to seek medical attention for an additional seizure. Reinforced with patient- do not drive until cleared by MD since she has new onset of seizure Reviewed medications with patient and discussed.  Importance of not missing any medications.  Reviewed importance of monitoring blood pressure and keeping a log, taking log to provider appointments  Reinforced importance of not missing any dialysis sessions.  Reviewed renal diet, fluid restrictions  Patient Self Care Activities:  Attend all scheduled provider appointments Call pharmacy for medication refills 3-7 days in advance of running out of medications Call provider office for new concerns or questions  Notify RN Care Manager of TOC call rescheduling needs Participate in Transition of Care Program/Attend TOC scheduled  calls Perform all self care activities independently  Take medications as prescribed   Seek emergency care for seizure Practice safety and avoid falls Attend dialysis as planned every MWF Do not drive until cleared by MD Check blood pressure and keep a log Attended your appointments next week (IR and rheumatology)    Plan:  Telephone follow up appointment with care management team member scheduled for:  07/07/2024 @ 1100am with Alan Ee RN The patient has been provided with contact information for the care management team and has been advised to call with any health related questions or concerns.         Recommendation:   Continue Current Plan of Care  Follow Up Plan:   Telephone follow up appointment date/time:  08//05/2024  Alan Ee, RN, BSN, CEN [facility]- Transition of Care Team.  [facility] [phone]

## 2024-06-30 DIAGNOSIS — Z992 Dependence on renal dialysis: Secondary | ICD-10-CM | POA: Diagnosis not present

## 2024-06-30 DIAGNOSIS — N186 End stage renal disease: Secondary | ICD-10-CM | POA: Diagnosis not present

## 2024-07-01 ENCOUNTER — Encounter: Payer: Self-pay | Admitting: Hematology and Oncology

## 2024-07-01 DIAGNOSIS — Z992 Dependence on renal dialysis: Secondary | ICD-10-CM | POA: Diagnosis not present

## 2024-07-01 DIAGNOSIS — N186 End stage renal disease: Secondary | ICD-10-CM | POA: Diagnosis not present

## 2024-07-05 ENCOUNTER — Ambulatory Visit (HOSPITAL_COMMUNITY): Admission: RE | Admit: 2024-07-05 | Source: Ambulatory Visit

## 2024-07-06 DIAGNOSIS — N186 End stage renal disease: Secondary | ICD-10-CM | POA: Diagnosis not present

## 2024-07-06 DIAGNOSIS — Z992 Dependence on renal dialysis: Secondary | ICD-10-CM | POA: Diagnosis not present

## 2024-07-07 ENCOUNTER — Telehealth: Payer: Self-pay

## 2024-07-07 ENCOUNTER — Encounter: Payer: Self-pay | Admitting: Hematology and Oncology

## 2024-07-07 NOTE — Progress Notes (Deleted)
 Office Visit Note  Patient: Cathy [name]             Date of Birth: 11/16/1981           MRN: [id number]             PCP: Terry Wilhelmena Lloyd Hilario, FNP Referring: Terry Wilhelmena Lloyd Michail* Visit Date: 07/08/2024 Occupation: @GUAROCC @  Subjective:  No chief complaint on file.   History of Present Illness: Cathy [name] is a 43 y.o. female ***     Activities of Daily Living:  Patient reports morning stiffness for *** {minute/hour:19697}.   Patient {ACTIONS;DENIES/REPORTS:21021675::Denies} nocturnal pain.  Difficulty dressing/grooming: {ACTIONS;DENIES/REPORTS:21021675::Denies} Difficulty climbing stairs: {ACTIONS;DENIES/REPORTS:21021675::Denies} Difficulty getting out of chair: {ACTIONS;DENIES/REPORTS:21021675::Denies} Difficulty using hands for taps, buttons, cutlery, and/or writing: {ACTIONS;DENIES/REPORTS:21021675::Denies}  No Rheumatology ROS completed.   PMFS History:  Patient Active Problem List   Diagnosis Date Noted   Dental abscess 06/16/2024   Difficulty sleeping 06/16/2024   Acute metabolic encephalopathy 05/31/2024   Hyperammonemia (HCC) 05/31/2024   NASH (nonalcoholic steatohepatitis) 05/31/2024   Abnormal pleural fluid 05/31/2024   Prolonged QT interval 05/31/2024   Cellulitis 02/20/2024   Chronic migraine w/o aura w/o status migrainosus, not intractable 12/18/2023   Vaginal discharge 12/18/2023   Elevated ferritin 10/19/2023   SLE (systemic lupus erythematosus related syndrome) (HCC) 10/19/2023   Hypertension 08/27/2023   Dyspnea 08/27/2023   Acute on chronic clinical systolic heart failure (HCC) 10/27/2022   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 cor pulmonale (HCC) 10/27/2022   Gastroesophageal reflux disease    Dysphagia    Abnormal MRI, liver    Cirrhosis of liver with ascites (HCC)    Acute hyperkalemia 06/06/2022   Abdominal pain 03/17/2022   Hypothyroidism, unspecified 03/05/2022   Ascites 03/02/2022   Volume overload  03/01/2022   Tobacco use 01/29/2022   Pulmonary embolism (HCC) 01/09/2022   History of pulmonary embolus 01/07/2022   Malignant hypertension 01/06/2022   Anemia due to chronic kidney disease 12/28/2021   Acute on chronic combined systolic and diastolic CHF (congestive heart failure) (HCC) 12/27/2021   Benign essential HTN 06/08/2021   HFrEF (heart failure with reduced ejection fraction) (HCC)    Nephrotic syndrome with focal and segmental glomerular lesions 03/12/2021   Encounter for antineoplastic chemotherapy 01/07/2021   ESRD on hemodialysis (HCC) 12/23/2020   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   Anxiety and depression 12/14/2020   Iron  deficiency anemia, unspecified 12/14/2020   Systemic lupus erythematosus, unspecified (HCC) 12/14/2020   Secondary hyperparathyroidism of renal origin (HCC) 12/14/2020   Pericardial effusion-chronic     Interstitial edema 11/07/2020   Pelvic adhesive disease 02/03/2019   Renal hypertension 02/02/2019   Non compliance w medication regimen 01/12/2019   Anemia 10/12/2018   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) 09/20/2018   MDD (major depressive disorder) 10/11/2015   Asthma 09/22/2011    Past Medical History:  Diagnosis Date   Anasarca associated with disorder of kidney 06/08/2021   Anxiety    Asthma    Bipolar depression (HCC)    Cirrhosis (HCC)    Depression    Dyspnea    ESRD (end stage renal disease) on dialysis (HCC) 10/2020   GERD (gastroesophageal reflux disease)    Gonorrhea    Lupus    Pelvic inflammatory disease (PID)    Pleural effusion 06/08/2021   Pulmonary embolism (HCC) 01/31/2022   Renal hypertension    Trichomonas infection     Family History  Problem Relation Age of Onset  Anxiety disorder Mother    Sleep apnea Father    Diabetes Maternal Grandmother    Hypertension Maternal Grandmother    Diabetes Maternal Grandfather    Hypertension Maternal Grandfather    Diabetes Paternal Grandmother     Hypertension Paternal Grandmother    Diabetes Paternal Grandfather    Hypertension Paternal Grandfather    Past Surgical History:  Procedure Laterality Date   A/V FISTULAGRAM N/A 02/22/2024   Procedure: A/V Fistulagram;  Surgeon: Sheree Penne Bruckner, MD;  Location: [facility] INVASIVE CV LAB;  Service: Cardiovascular;  Laterality: N/A;   AV FISTULA PLACEMENT Left 06/10/2022   Procedure: LEFT ARM ARTERIOVENOUS (AV) FISTULA CREATION;  Surgeon: Oris Krystal FALCON, MD;  Location: [facility] ORS;  Service: Vascular;  Laterality: Left;   BASCILIC VEIN TRANSPOSITION Left 03/24/2023   Procedure: LEFT ARM SECOND STAGE BASILIC VEIN TRANSPOSITION;  Surgeon: Oris Krystal FALCON, MD;  Location: [facility] ORS;  Service: Vascular;  Laterality: Left;   CENTRAL LINE INSERTION-TUNNELED N/A 02/22/2024   Procedure: CENTRAL LINE INSERTION-TUNNELED;  Surgeon: Sheree Penne Bruckner, MD;  Location: [facility] INVASIVE CV LAB;  Service: Cardiovascular;  Laterality: N/A;   ESOPHAGEAL DILATION N/A 05/10/2024   Procedure: DILATION, ESOPHAGUS;  Surgeon: Eartha Angelia Sieving, MD;  Location: [facility] ENDO SUITE;  Service: Gastroenterology;  Laterality: N/A;   ESOPHAGOGASTRODUODENOSCOPY N/A 05/10/2024   Procedure: EGD (ESOPHAGOGASTRODUODENOSCOPY);  Surgeon: Eartha Angelia, Sieving, MD;  Location: [facility] ENDO SUITE;  Service: Gastroenterology;  Laterality: N/A;  8:45 am, asa 3  dialysis M,W & F   IR FLUORO GUIDE CV LINE RIGHT  11/30/2020   IR FLUORO GUIDE CV LINE RIGHT  06/13/2021   IR FLUORO GUIDE CV LINE RIGHT  02/01/2022   IR REMOVAL TUN CV CATH W/O FL  06/11/2021   IR REMOVAL TUN CV CATH W/O FL  01/29/2022   IR US  GUIDE VASC ACCESS RIGHT  11/30/2020   IR US  GUIDE VASC ACCESS RIGHT  06/13/2021   IR US  GUIDE VASC ACCESS RIGHT  02/03/2022   RENAL BIOPSY     TEE WITHOUT CARDIOVERSION N/A 06/13/2021   Procedure: TRANSESOPHAGEAL ECHOCARDIOGRAM (TEE);  Surgeon: Delford Maude BROCKS, MD;  Location: [facility] ENDOSCOPY;  Service: Cardiovascular;  Laterality: N/A;   TEE WITHOUT  CARDIOVERSION N/A 02/03/2022   Procedure: TRANSESOPHAGEAL ECHOCARDIOGRAM (TEE);  Surgeon: Tobb, Kardie, DO;  Location: [facility] ENDOSCOPY;  Service: Cardiovascular;  Laterality: N/A;   TUBAL LIGATION N/A 02/03/2019   Procedure: POST PARTUM TUBAL LIGATION;  Surgeon: Izell Harari, MD;  Location: [facility] LD ORS;  Service: Gynecology;  Laterality: N/A;   Social History   Social History Narrative   Not on file   Immunization History  Administered Date(s) Administered   Influenza Split 09/26/2011   Influenza,inj,Quad PF,6+ Mos 09/12/2021, 08/10/2022   Influenza-Unspecified 09/22/2023   Pneumococcal Conjugate-13 02/05/2021   Pneumococcal Polysaccharide-23 09/26/2011, 04/20/2021   Tdap 09/26/2011, 12/10/2018, 01/12/2019     Objective: Vital Signs: LMP 04/07/2020 (Within Months)    Physical Exam   Musculoskeletal Exam: ***  CDAI Exam: CDAI Score: -- Patient Global: --; Provider Global: -- Swollen: --; Tender: -- Joint Exam 07/08/2024   No joint exam has been documented for this visit   There is currently no information documented on the homunculus. Go to the Rheumatology activity and complete the homunculus joint exam.  Investigation: No additional findings.  Imaging: No results found.  Recent Labs: Lab Results  Component Value Date   WBC 4.6 06/03/2024   HGB 8.3 (L) 06/03/2024   PLT 119 (L) 06/03/2024   NA  137 06/03/2024   K 4.2 06/03/2024   CL 98 06/03/2024   CO2 28 06/03/2024   GLUCOSE 97 06/03/2024   BUN 36 (H) 06/03/2024   CREATININE 7.36 (H) 06/03/2024   BILITOT 0.6 06/03/2024   ALKPHOS 216 (H) 06/03/2024   AST 16 06/03/2024   ALT 12 06/03/2024   PROT 6.5 06/03/2024   ALBUMIN  2.9 (L) 06/03/2024   CALCIUM  8.2 (L) 06/03/2024   GFRAA >60 08/07/2020   QFTBGOLDPLUS Indeterminate (A) 11/30/2020    Speciality Comments: No specialty comments available.  Procedures:  No procedures performed Allergies: Dialyvite 800 [nephro-vite]   Assessment / Plan:     Visit  Diagnoses: No diagnosis found.  Orders: No orders of the defined types were placed in this encounter.  No orders of the defined types were placed in this encounter.   Face-to-face time spent with patient was *** minutes. Greater than 50% of time was spent in counseling and coordination of care.  Follow-Up Instructions: No follow-ups on file.   Lonni LELON Ester, MD  Note - This record has been created using [employer].  Chart creation errors have been sought, but may not always  have been located. Such creation errors do not reflect on  the standard of medical care.

## 2024-07-08 ENCOUNTER — Encounter: Admitting: Internal Medicine

## 2024-07-08 ENCOUNTER — Telehealth: Payer: Self-pay

## 2024-07-08 DIAGNOSIS — Z992 Dependence on renal dialysis: Secondary | ICD-10-CM | POA: Diagnosis not present

## 2024-07-08 DIAGNOSIS — N186 End stage renal disease: Secondary | ICD-10-CM | POA: Diagnosis not present

## 2024-07-08 NOTE — Patient Instructions (Signed)
 Visit Information  Thank you for taking time to visit with me today.  Following is a copy of your care plan:   Goals Addressed             This Visit's Progress    [facility] Transitions of Care (TOC) Care Plan       Problems:  Recent Hospitalization for treatment of lupus, ESRD, new onset of seizures   06/22/2024 Denies any seizure but reports that she has hands, arms and leg shaking daily. Reports shaking happens all day long. 06/29/2024  Patient reports that she is doing well. No seizures. Reports that she picked up her keppra  and is taking as prescribed.  Reports decrease in  shaking episodes 07/08/2024  Patient reports she is no longer having any shaking episodes. Reports keppra  is working well.  Functional/Safety concern: new onset of seizure.  Encouraged patient not to drive until cleared by MD,  no complaint of headaches, no seizure activity reported. 06/29/2024   denies any concerns at this time. 07/08/2024 Patient denies any concerns today.  Patient states she has a blood pressure cuff and will start checking blood pressure today and keeping a log, pt reports she has all medications and taking as prescribed, continues dialysis M,W,F.  06/22/2024  Reports BP is high  176/100 right now at dialysis. Mother is monitoring daily at home. 06/29/2024  Patient reports that she is sleepy and has not checked today. 07/08/2024  Reports she continues to go to dialysis as scheduled. Reports BP monitored by dialysis  Goal:  Over the next 30 days, the patient will not experience hospital readmission  Interventions:  Transitions of Care: Doctor Visits  - discussed the importance of doctor visits Call MD for changes in condition Reviewed new patient appointment with rheumatology. Reviewed date and time. Encouraged patient to cancel if she is not able to attend.  Reviewed with patient that she has an appointment today .  Evaluation of current treatment plan related to seizures,  self-management and  patient's adherence to plan as established by provider. Advised patient to seek medical attention for an additional seizure. Reinforced with patient- do not drive until cleared by MD since she has new onset of seizure Reviewed medications with patient and discussed.  Importance of not missing any medications.  Reviewed importance of monitoring blood pressure and keeping a log, taking log to provider appointments  Reinforced importance of not missing any dialysis sessions.    Patient Self Care Activities:  Attend all scheduled provider appointments Call pharmacy for medication refills 3-7 days in advance of running out of medications Call provider office for new concerns or questions  Perform all self care activities independently  Take medications as prescribed   Seek emergency care for seizure Practice safety and avoid falls Attend dialysis as planned every MWF Do not drive until cleared by MD Check blood pressure and keep a log    Plan:  Patient has met goals and completed TOC program. Encouraged patient to call MD for any health concerns and continue to go to dialysis as planned.         Patient verbalizes understanding of instructions and care plan provided today and agrees to view in MyChart. Active MyChart status and patient understanding of how to access instructions and care plan via MyChart confirmed with patient.     No further follow up required: patient to call MD for any needs.  Please call the care guide team at [phone] if you need to cancel or  reschedule your appointment.   Please call the Suicide and Crisis Lifeline: 988 call the [place]  National Suicide Prevention Lifeline: 803-607-3729 or TTY: 978-130-3028 TTY 202-612-7843) to talk to a trained counselor call 1-800-273-TALK (toll free, 24 hour hotline) call 911 if you are experiencing a Mental Health or Behavioral Health Crisis or need someone to talk to.  Alan Ee, RN, BSN, CEN [employer]-  Transition of Care Team.  [facility] [phone]

## 2024-07-08 NOTE — Transitions of Care (Post Inpatient/ED Visit) (Signed)
 Transition of Care week 5  Visit Note  07/08/2024  Name: Cathy [name] MRN: [id number]          DOB: October 24, 1981  Situation: Patient enrolled in [facility] 30-day program. Visit completed with patient by telephone.   Background:   Initial Transition Care Management Follow-up Telephone Call    Past Medical History:  Diagnosis Date   Anasarca associated with disorder of kidney 06/08/2021   Anxiety    Asthma    Bipolar depression (HCC)    Cirrhosis (HCC)    Depression    Dyspnea    ESRD (end stage renal disease) on dialysis (HCC) 10/2020   GERD (gastroesophageal reflux disease)    Gonorrhea    Lupus    Pelvic inflammatory disease (PID)    Pleural effusion 06/08/2021   Pulmonary embolism (HCC) 01/31/2022   Renal hypertension    Trichomonas infection     Assessment: Patient reports that she is doing well. Reports no more shaking episodes since she is taking the Keppra . Reports that she is attending her dialysis sessions as planned. Denies any new problems or concerns today.  Patient Reported Symptoms: Cognitive Cognitive Status: Able to follow simple commands, Alert and oriented to person, place, and time, Normal speech and language skills      Neurological Neurological Review of Symptoms: No symptoms reported    HEENT HEENT Symptoms Reported: No symptoms reported      Cardiovascular Cardiovascular Symptoms Reported: No symptoms reported    Respiratory Respiratory Symptoms Reported: No symptoms reported    Endocrine Endocrine Symptoms Reported: No symptoms reported Is patient diabetic?: No    Gastrointestinal Gastrointestinal Symptoms Reported: No symptoms reported      Genitourinary Genitourinary Symptoms Reported: No symptoms reported    Integumentary Integumentary Symptoms Reported: No symptoms reported    Musculoskeletal Musculoskelatal Symptoms Reviewed: No symptoms reported        Psychosocial Psychosocial Symptoms Reported: No symptoms reported          There were no vitals filed for this visit.  Medications Reviewed Today     Reviewed by Rumalda Alan PENNER, RN (Registered Nurse) on 07/08/24 at [phone]  Med List Status: <None>   Medication Order Taking? Sig Documenting Provider Last Dose Status Informant  acetaminophen  (TYLENOL ) 500 MG tablet [id number]  Take 1,000 mg by mouth every 6 (six) hours as needed for mild pain (pain score 1-3). [provider]  Active Pharmacy Records, Mother  albuterol  (VENTOLIN  HFA) 108 [phone] Base) MCG/ACT inhaler [id number]  Inhale 2 puffs into the lungs every 6 (six) hours as needed for wheezing or shortness of breath. Del Wilhelmena Lloyd Sola, FNP  Active Pharmacy Records, Mother           Med Note MAZIE, [place]   Thu Apr 07, 2024  9:13 AM)    amLODipine  (NORVASC ) 10 MG tablet [id number]  Take 1 tablet (10 mg total) by mouth in the morning. Del Wilhelmena Lloyd, Sola, FNP  Active   aspirin  81 MG chewable tablet [id number]  Chew 1 tablet (81 mg total) by mouth daily. Maree, Pratik D, DO  Active   atorvastatin  (LIPITOR) 40 MG tablet [id number]  Take 1 tablet (40 mg total) by mouth daily. Maree, Pratik D, DO  Active   b complex-vitamin c-folic acid  (NEPHRO-VITE) 0.8 MG TABS tablet [id number]  Take 1 tablet by mouth daily. [provider]  Active Pharmacy Records, Mother  Blood Pressure Monitoring (OMRON 3 SERIES BP MONITOR) DEVI [id number]  Use as directed 2  times a day Del Wilhelmena Falter, Hilario, FNP  Active Pharmacy Records, Mother  busPIRone  (BUSPAR ) 7.5 MG tablet [id number]  Take 1 tablet (7.5 mg total) by mouth daily. Maree, Pratik D, DO  Active   camphor-menthol  [facility]) lotion [id number]  Apply topically as needed for itching. Tobie Yetta HERO, MD  Active Pharmacy Records, Mother  carvedilol  (COREG ) 12.5 MG tablet [id number]  Take 1 tablet (12.5 mg total) by mouth 2 (two) times daily with a meal. Del Wilhelmena Falter, Forsan, FNP  Active   cloNIDine  (CATAPRES ) 0.3 MG tablet [id number]  Take 1 tablet (0.3 mg total) by  mouth 2 (two) times daily. Del Wilhelmena Falter, Hilario, FNP  Active   gabapentin  (NEURONTIN ) 300 MG capsule [id number]  Take 1 capsule (300 mg total) by mouth at bedtime. Del Orbe Polanco, Hilario, FNP  Active   hydrALAZINE  (APRESOLINE ) 100 MG tablet [id number]  Take 1 tablet (100 mg total) by mouth 3 (three) times daily. Del Orbe Polanco, Hilario, FNP  Active   hydrOXYzine  (ATARAX ) 25 MG tablet [id number]  Take 25 mg by mouth every 12 (twelve) hours as needed. [provider]  Active Pharmacy Records, Mother           Med Note DRENA, CHUCK KANDICE Debar May 31, 2024  6:23 PM) Pt's mother was unsure if pt still takes this or not   levETIRAcetam  (KEPPRA ) 250 MG tablet [id number]  Take 2 tablets (500 mg total) by mouth daily AND 1 tablet (250 mg total) every Monday, Wednesday, and Friday with hemodialysis. Maree, Pratik D, DO  Active   lidocaine  (XYLOCAINE ) 2 % solution [id number]  Use as directed 15 mLs in the mouth or throat as needed. Del Wilhelmena Falter, Hilario, FNP  Active   losartan  (COZAAR ) 50 MG tablet [id number]  Take 1 tablet (50 mg total) by mouth at bedtime. Del Orbe Polanco, Hilario, FNP  Active   pantoprazole  (PROTONIX ) 40 MG tablet [id number]  TAKE 1 TABLET BY MOUTH DAILY BEFORE BREAKFAST Del Wilhelmena Falter Hilario, FNP  Active   Med List Note (Horton, Jeannetta, CPhT 03/23/23 9152): Dialysis tu, th, Sat.            Goals      VBCI Transitions of Care (TOC) Care Plan     Problems:  Recent Hospitalization for treatment of lupus, ESRD, new onset of seizures   06/22/2024 Denies any seizure but reports that she has hands, arms and leg shaking daily. Reports shaking happens all day long. 06/29/2024  Patient reports that she is doing well. No seizures. Reports that she picked up her keppra  and is taking as prescribed.  Reports decrease in  shaking episodes 07/08/2024  Patient reports she is no longer having any shaking episodes. Reports keppra  is working well.  Functional/Safety concern: new onset  of seizure.  Encouraged patient not to drive until cleared by MD,  no complaint of headaches, no seizure activity reported. 06/29/2024   denies any concerns at this time. 07/08/2024 Patient denies any concerns today.  Patient states she has a blood pressure cuff and [name] start checking blood pressure today and keeping a log, pt reports she has all medications and taking as prescribed, continues dialysis M,W,F.  06/22/2024  Reports BP is high  176/100 right now at dialysis. Mother is monitoring daily at home. 06/29/2024  Patient reports that she is sleepy and has not checked today. 07/08/2024  Reports she continues to go to dialysis as scheduled. Reports BP monitored by dialysis  Goal:  Over the next 30 days, the patient [name] not experience hospital readmission  Interventions:  Transitions of Care: Doctor Visits  - discussed the importance of doctor visits Call MD for changes in condition Reviewed new patient appointment with rheumatology. Reviewed date and time. Encouraged patient to cancel if she is not able to attend.  Reviewed with patient that she has an appointment today .  Evaluation of current treatment plan related to seizures,  self-management and patient's adherence to plan as established by provider. Advised patient to seek medical attention for an additional seizure. Reinforced with patient- do not drive until cleared by MD since she has new onset of seizure Reviewed medications with patient and discussed.  Importance of not missing any medications.  Reviewed importance of monitoring blood pressure and keeping a log, taking log to provider appointments  Reinforced importance of not missing any dialysis sessions.    Patient Self Care Activities:  Attend all scheduled provider appointments Call pharmacy for medication refills 3-7 days in advance of running out of medications Call provider office for new concerns or questions  Perform all self care activities independently  Take  medications as prescribed   Seek emergency care for seizure Practice safety and avoid falls Attend dialysis as planned every MWF Do not drive until cleared by MD Check blood pressure and keep a log    Plan:  Patient has met goals and completed TOC program. Encouraged patient to call MD for any health concerns and continue to go to dialysis as planned.          Recommendation:   Continue Current Plan of Care  Follow Up Plan:   Closing From:  Transitions of Care Program  Alan Ee, RN, BSN, [employer]- Transition of Care Team.  [facility] [phone]

## 2024-07-11 ENCOUNTER — Ambulatory Visit (HOSPITAL_COMMUNITY): Admission: RE | Admit: 2024-07-11 | Source: Ambulatory Visit

## 2024-07-13 ENCOUNTER — Encounter (HOSPITAL_COMMUNITY): Payer: Self-pay

## 2024-07-13 ENCOUNTER — Inpatient Hospital Stay (HOSPITAL_COMMUNITY): Admission: RE | Admit: 2024-07-13 | Source: Ambulatory Visit

## 2024-07-13 DIAGNOSIS — Z992 Dependence on renal dialysis: Secondary | ICD-10-CM | POA: Diagnosis not present

## 2024-07-13 DIAGNOSIS — N186 End stage renal disease: Secondary | ICD-10-CM | POA: Diagnosis not present

## 2024-07-18 ENCOUNTER — Encounter: Payer: Self-pay | Admitting: Internal Medicine

## 2024-07-20 DIAGNOSIS — N186 End stage renal disease: Secondary | ICD-10-CM | POA: Diagnosis not present

## 2024-07-20 DIAGNOSIS — Z992 Dependence on renal dialysis: Secondary | ICD-10-CM | POA: Diagnosis not present

## 2024-07-25 DIAGNOSIS — Z992 Dependence on renal dialysis: Secondary | ICD-10-CM | POA: Diagnosis not present

## 2024-07-25 DIAGNOSIS — N186 End stage renal disease: Secondary | ICD-10-CM | POA: Diagnosis not present

## 2024-07-25 NOTE — Progress Notes (Deleted)
 Cardiology Office Note   Date:  07/25/2024   ID:  Cathy [name], DOB 1981-07-26, MRN [id number]  PCP:  Terry Wilhelmena Lloyd Hilario, FNP  Cardiologist:   None Referring:  ***  No chief complaint on file.     History of Present Illness: Cathy [name] is a 43 y.o. female who presents for ***       Past Medical History:  Diagnosis Date   Anasarca associated with disorder of kidney 06/08/2021   Anxiety    Asthma    Bipolar depression (HCC)    Cirrhosis (HCC)    Depression    Dyspnea    ESRD (end stage renal disease) on dialysis (HCC) 10/2020   GERD (gastroesophageal reflux disease)    Gonorrhea    Lupus    Pelvic inflammatory disease (PID)    Pleural effusion 06/08/2021   Pulmonary embolism (HCC) 01/31/2022   Renal hypertension    Trichomonas infection     Past Surgical History:  Procedure Laterality Date   A/V FISTULAGRAM N/A 02/22/2024   Procedure: A/V Fistulagram;  Surgeon: Sheree Penne Bruckner, MD;  Location: [facility] INVASIVE CV LAB;  Service: Cardiovascular;  Laterality: N/A;   AV FISTULA PLACEMENT Left 06/10/2022   Procedure: LEFT ARM ARTERIOVENOUS (AV) FISTULA CREATION;  Surgeon: Oris Krystal FALCON, MD;  Location: [facility] ORS;  Service: Vascular;  Laterality: Left;   BASCILIC VEIN TRANSPOSITION Left 03/24/2023   Procedure: LEFT ARM SECOND STAGE BASILIC VEIN TRANSPOSITION;  Surgeon: Oris Krystal FALCON, MD;  Location: [facility] ORS;  Service: Vascular;  Laterality: Left;   CENTRAL LINE INSERTION-TUNNELED N/A 02/22/2024   Procedure: CENTRAL LINE INSERTION-TUNNELED;  Surgeon: Sheree Penne Bruckner, MD;  Location[facility] INVASIVE CV LAB;  Service: Cardiovascular;  Laterality: N/A;   ESOPHAGEAL DILATION N/A 05/10/2024   Procedure: DILATION, ESOPHAGUS;  Surgeon: Eartha Angelia Sieving, MD;  Location: [facility] ENDO SUITE;  Service: Gastroenterology;  Laterality: N/A;   ESOPHAGOGASTRODUODENOSCOPY N/A 05/10/2024   Procedure: EGD (ESOPHAGOGASTRODUODENOSCOPY);  Surgeon: Eartha Angelia, Sieving,  MD;  Location: [facility] ENDO SUITE;  Service: Gastroenterology;  Laterality: N/A;  8:45 am, asa 3  dialysis M,W & F   IR FLUORO GUIDE CV LINE RIGHT  11/30/2020   IR FLUORO GUIDE CV LINE RIGHT  06/13/2021   IR FLUORO GUIDE CV LINE RIGHT  02/01/2022   IR REMOVAL TUN CV CATH W/O FL  06/11/2021   IR REMOVAL TUN CV CATH W/O FL  01/29/2022   IR US  GUIDE VASC ACCESS RIGHT  11/30/2020   IR US  GUIDE VASC ACCESS RIGHT  06/13/2021   IR US  GUIDE VASC ACCESS RIGHT  02/03/2022   RENAL BIOPSY     TEE WITHOUT CARDIOVERSION N/A 06/13/2021   Procedure: TRANSESOPHAGEAL ECHOCARDIOGRAM (TEE);  Surgeon: Delford Maude BROCKS, MD;  Location: [facility] ENDOSCOPY;  Service: Cardiovascular;  Laterality: N/A;   TEE WITHOUT CARDIOVERSION N/A 02/03/2022   Procedure: TRANSESOPHAGEAL ECHOCARDIOGRAM (TEE);  Surgeon: Tobb, Kardie, DO;  Location: [facility] ENDOSCOPY;  Service: Cardiovascular;  Laterality: N/A;   TUBAL LIGATION N/A 02/03/2019   Procedure: POST PARTUM TUBAL LIGATION;  Surgeon: Izell Harari, MD;  Location: [facility] LD ORS;  Service: Gynecology;  Laterality: N/A;     Current Outpatient Medications  Medication Sig Dispense Refill   acetaminophen  (TYLENOL ) 500 MG tablet Take 1,000 mg by mouth every 6 (six) hours as needed for mild pain (pain score 1-3).     albuterol  (VENTOLIN  HFA) 108 (90 Base) MCG/ACT inhaler Inhale 2 puffs into the lungs every 6 (six) hours as needed  for wheezing or shortness of breath. 8 g 2   amLODipine  (NORVASC ) 10 MG tablet Take 1 tablet (10 mg total) by mouth in the morning. 30 tablet 3   aspirin  81 MG chewable tablet Chew 1 tablet (81 mg total) by mouth daily. 60 tablet 1   atorvastatin  (LIPITOR) 40 MG tablet Take 1 tablet (40 mg total) by mouth daily. 60 tablet 1   b complex-vitamin c-folic acid  (NEPHRO-VITE) 0.8 MG TABS tablet Take 1 tablet by mouth daily.     Blood Pressure Monitoring (OMRON 3 SERIES BP MONITOR) DEVI Use as directed 2 times a day 3 kit 0   busPIRone  (BUSPAR ) 7.5 MG tablet Take 1 tablet (7.5 mg total) by  mouth daily. 30 tablet 0   camphor-menthol  (SARNA) lotion Apply topically as needed for itching. 222 mL 0   carvedilol  (COREG ) 12.5 MG tablet Take 1 tablet (12.5 mg total) by mouth 2 (two) times daily with a meal. 60 tablet 3   cloNIDine  (CATAPRES ) 0.3 MG tablet Take 1 tablet (0.3 mg total) by mouth 2 (two) times daily. 60 tablet 3   gabapentin  (NEURONTIN ) 300 MG capsule Take 1 capsule (300 mg total) by mouth at bedtime. 30 capsule 3   hydrALAZINE  (APRESOLINE ) 100 MG tablet Take 1 tablet (100 mg total) by mouth 3 (three) times daily. 90 tablet 3   hydrOXYzine  (ATARAX ) 25 MG tablet Take 25 mg by mouth every 12 (twelve) hours as needed.     levETIRAcetam  (KEPPRA ) 250 MG tablet Take 2 tablets (500 mg total) by mouth daily AND 1 tablet (250 mg total) every Monday, Wednesday, and Friday with hemodialysis. 144 tablet 0   lidocaine  (XYLOCAINE ) 2 % solution Use as directed 15 mLs in the mouth or throat as needed. 100 mL 0   losartan  (COZAAR ) 50 MG tablet Take 1 tablet (50 mg total) by mouth at bedtime. 30 tablet 3   pantoprazole  (PROTONIX ) 40 MG tablet TAKE 1 TABLET BY MOUTH DAILY BEFORE BREAKFAST 30 tablet 1   No current facility-administered medications for this visit.    Allergies:   Dialyvite 800 [nephro-vite]    Social History:  The patient  reports that she has been smoking cigarettes. She started smoking about 25 years ago. She has a 6.4 pack-year smoking history. She has never used smokeless tobacco. She reports that she does not currently use alcohol. She reports current drug use. Frequency: 3.00 times per week. Drug: Marijuana.   Family History:  The patient's ***family history includes Anxiety disorder in her mother; Diabetes in her maternal grandfather, maternal grandmother, paternal grandfather, and paternal grandmother; Hypertension in her maternal grandfather, maternal grandmother, paternal grandfather, and paternal grandmother; Sleep apnea in her father.    ROS:  Please see the history  of present illness.   Otherwise, review of systems are positive for {NONE DEFAULTED:18576}.   All other systems are reviewed and negative.    PHYSICAL EXAM: VS:  LMP 04/07/2020 (Within Months)  , BMI There is no height or weight on file to calculate BMI. GENERAL:  Well appearing HEENT:  Pupils equal round and reactive, fundi not visualized, oral mucosa unremarkable NECK:  No jugular venous distention, waveform within normal limits, carotid upstroke brisk and symmetric, no bruits, no thyromegaly LYMPHATICS:  No cervical, inguinal adenopathy LUNGS:  Clear to auscultation bilaterally BACK:  No CVA tenderness CHEST:  Unremarkable HEART:  PMI not displaced or sustained,S1 and S2 within normal limits, no S3, no S4, no clicks, no rubs, *** murmurs ABD:  Flat, positive bowel sounds normal in frequency in pitch, no bruits, no rebound, no guarding, no midline pulsatile mass, no hepatomegaly, no splenomegaly EXT:  2 plus pulses throughout, no edema, no cyanosis no clubbing SKIN:  No rashes no nodules NEURO:  Cranial nerves II through XII grossly intact, motor grossly intact throughout PSYCH:  Cognitively intact, oriented to person place and time    EKG:        Recent Labs: 05/31/2024: TSH 0.946 06/03/2024: ALT 12; BUN 36; Creatinine, Ser 7.36; Hemoglobin 8.3; Magnesium  1.9; Platelets 119; Potassium 4.2; Sodium 137    Lipid Panel    Component Value Date/Time   CHOL 201 (H) 06/03/2024 0413   CHOL 182 08/27/2023 1004   TRIG 148 06/03/2024 0413   HDL 32 (L) 06/03/2024 0413   HDL 37 (L) 08/27/2023 1004   CHOLHDL 6.3 06/03/2024 0413   VLDL 30 06/03/2024 0413   LDLCALC 139 (H) 06/03/2024 0413   LDLCALC 113 (H) 08/27/2023 1004   LDLDIRECT 154.7 (H) 11/12/2020 0526      Wt Readings from Last 3 Encounters:  06/16/24 146 lb (66.2 kg)  06/02/24 136 lb 14.5 oz (62.1 kg)  05/10/24 158 lb 11.7 oz (72 kg)      Other studies Reviewed: Additional studies/ records that were reviewed today  include: ***. Review of the above records demonstrates:  Please see elsewhere in the note.  ***   ASSESSMENT AND PLAN:  ***   Current medicines are reviewed at length with the patient today.  The patient {ACTIONS; HAS/DOES NOT HAVE:19233} concerns regarding medicines.  The following changes have been made:  {PLAN; NO CHANGE:13088:s}  Labs/ tests ordered today include: *** No orders of the defined types were placed in this encounter.    Disposition:   FU with ***    Signed, Lynwood Schilling, MD  07/25/2024 8:11 PM    [facility]

## 2024-07-27 ENCOUNTER — Ambulatory Visit: Admitting: Cardiology

## 2024-07-27 DIAGNOSIS — Z992 Dependence on renal dialysis: Secondary | ICD-10-CM | POA: Diagnosis not present

## 2024-07-27 DIAGNOSIS — N186 End stage renal disease: Secondary | ICD-10-CM | POA: Diagnosis not present

## 2024-07-31 DIAGNOSIS — Z992 Dependence on renal dialysis: Secondary | ICD-10-CM | POA: Diagnosis not present

## 2024-07-31 DIAGNOSIS — N186 End stage renal disease: Secondary | ICD-10-CM | POA: Diagnosis not present

## 2024-08-03 DIAGNOSIS — N186 End stage renal disease: Secondary | ICD-10-CM | POA: Diagnosis not present

## 2024-08-03 DIAGNOSIS — Z992 Dependence on renal dialysis: Secondary | ICD-10-CM | POA: Diagnosis not present

## 2024-08-09 ENCOUNTER — Telehealth: Payer: Self-pay | Admitting: Family Medicine

## 2024-08-09 ENCOUNTER — Ambulatory Visit (HOSPITAL_COMMUNITY): Admission: RE | Admit: 2024-08-09 | Source: Ambulatory Visit

## 2024-08-09 NOTE — Telephone Encounter (Signed)
 [facility] forms  Copied Noted Sleeved Original placed in provider box Copy placed in front desk folder

## 2024-08-10 DIAGNOSIS — Z992 Dependence on renal dialysis: Secondary | ICD-10-CM | POA: Diagnosis not present

## 2024-08-10 DIAGNOSIS — N186 End stage renal disease: Secondary | ICD-10-CM | POA: Diagnosis not present

## 2024-08-15 NOTE — Telephone Encounter (Signed)
 Put in Gloria's box to sign for [employer]

## 2024-08-16 ENCOUNTER — Telehealth: Payer: Self-pay

## 2024-08-16 NOTE — Telephone Encounter (Signed)
 Call annette moyer call [phone] when ready to pick up

## 2024-08-16 NOTE — Telephone Encounter (Signed)
 Copied from CRM #[id number]. Topic: General - Other >> Aug 16, 2024  8:39 AM Cleave MATSU wrote: Reason for CRM: [facility] faxed something over on sept 8th and sept 11th and they wanted to know if you guys received it. If not please follow up with them [phone] >> Aug 16, 2024  2:56 PM Ivette P wrote: Pt called in for an update. Advised that request was still being processed. Pls follow up with pt with update.

## 2024-08-16 NOTE — Telephone Encounter (Signed)
 Copied from CRM #[id number]. Topic: General - Other >> Aug 16, 2024  8:39 AM Cleave MATSU wrote: Reason for CRM: [facility] faxed something over on sept 8th and sept 11th and they wanted to know if you guys received it. If not please follow up with them [phone]

## 2024-08-16 NOTE — Telephone Encounter (Signed)
 Form is in provider box, awaiting Cathy [name] , unsure who is handling paperwork while iliana is out on leave

## 2024-08-17 DIAGNOSIS — Z992 Dependence on renal dialysis: Secondary | ICD-10-CM | POA: Diagnosis not present

## 2024-08-17 NOTE — Telephone Encounter (Signed)
 Form is in Gloria's box for her to sign in Iliana's absence

## 2024-08-18 NOTE — Telephone Encounter (Signed)
 Faxed to [facility] to fax number on form with confirmation.  Original at front desk in completed folder Call annette moyer call [phone] when ready to pick up

## 2024-08-22 ENCOUNTER — Telehealth: Payer: Self-pay

## 2024-08-22 NOTE — Telephone Encounter (Signed)
 Copied from CRM [id number]. Topic: Clinical - Prescription Issue >> Aug 22, 2024 10:27 AM Everette C wrote: Reason for CRM: Alex with [facility] has requested to be called at [phone] to confirm receipt of a previous request for a transfer of all the patient's active medications and an updated med list. Marolyn would like to be contacted by practice staff to discuss the requested which was submitted on 08/20/24

## 2024-08-23 NOTE — Telephone Encounter (Signed)
Documents received.

## 2024-08-24 ENCOUNTER — Emergency Department (HOSPITAL_COMMUNITY)

## 2024-08-24 ENCOUNTER — Observation Stay (HOSPITAL_COMMUNITY)
Admission: EM | Admit: 2024-08-24 | Discharge: 2024-08-26 | Disposition: A | Discharge status: Home / Self Care | Attending: Internal Medicine | Admitting: Internal Medicine

## 2024-08-24 ENCOUNTER — Other Ambulatory Visit: Payer: Self-pay

## 2024-08-24 ENCOUNTER — Encounter (HOSPITAL_COMMUNITY): Payer: Self-pay

## 2024-08-24 DIAGNOSIS — R7989 Other specified abnormal findings of blood chemistry: Secondary | ICD-10-CM | POA: Insufficient documentation

## 2024-08-24 DIAGNOSIS — Z992 Dependence on renal dialysis: Secondary | ICD-10-CM | POA: Diagnosis not present

## 2024-08-24 DIAGNOSIS — R188 Other ascites: Secondary | ICD-10-CM | POA: Diagnosis not present

## 2024-08-24 DIAGNOSIS — F14920 Cocaine use, unspecified with intoxication, uncomplicated: Secondary | ICD-10-CM | POA: Insufficient documentation

## 2024-08-24 DIAGNOSIS — F17210 Nicotine dependence, cigarettes, uncomplicated: Secondary | ICD-10-CM | POA: Insufficient documentation

## 2024-08-24 DIAGNOSIS — J9811 Atelectasis: Secondary | ICD-10-CM | POA: Diagnosis not present

## 2024-08-24 DIAGNOSIS — M329 Systemic lupus erythematosus, unspecified: Secondary | ICD-10-CM | POA: Diagnosis present

## 2024-08-24 DIAGNOSIS — K7460 Unspecified cirrhosis of liver: Secondary | ICD-10-CM | POA: Insufficient documentation

## 2024-08-24 DIAGNOSIS — G40909 Epilepsy, unspecified, not intractable, without status epilepticus: Secondary | ICD-10-CM | POA: Insufficient documentation

## 2024-08-24 DIAGNOSIS — E8770 Fluid overload, unspecified: Secondary | ICD-10-CM | POA: Diagnosis present

## 2024-08-24 DIAGNOSIS — R0781 Pleurodynia: Secondary | ICD-10-CM | POA: Diagnosis not present

## 2024-08-24 DIAGNOSIS — R519 Headache, unspecified: Secondary | ICD-10-CM | POA: Diagnosis not present

## 2024-08-24 DIAGNOSIS — I5032 Chronic diastolic (congestive) heart failure: Secondary | ICD-10-CM | POA: Diagnosis not present

## 2024-08-24 DIAGNOSIS — I132 Hypertensive heart and chronic kidney disease with heart failure and with stage 5 chronic kidney disease, or end stage renal disease: Secondary | ICD-10-CM | POA: Diagnosis not present

## 2024-08-24 DIAGNOSIS — N186 End stage renal disease: Principal | ICD-10-CM | POA: Insufficient documentation

## 2024-08-24 DIAGNOSIS — Z7982 Long term (current) use of aspirin: Secondary | ICD-10-CM | POA: Diagnosis not present

## 2024-08-24 DIAGNOSIS — Z79899 Other long term (current) drug therapy: Secondary | ICD-10-CM | POA: Diagnosis not present

## 2024-08-24 DIAGNOSIS — I120 Hypertensive chronic kidney disease with stage 5 chronic kidney disease or end stage renal disease: Secondary | ICD-10-CM | POA: Diagnosis not present

## 2024-08-24 DIAGNOSIS — R110 Nausea: Secondary | ICD-10-CM | POA: Diagnosis not present

## 2024-08-24 DIAGNOSIS — G629 Polyneuropathy, unspecified: Secondary | ICD-10-CM | POA: Diagnosis not present

## 2024-08-24 LAB — CBC WITH DIFFERENTIAL/PLATELET
Abs Immature Granulocytes: 0.03 K/uL (ref 0.00–0.07)
Basophils Absolute: 0 K/uL (ref 0.0–0.1)
Basophils Relative: 1 %
Eosinophils Absolute: 0.2 K/uL (ref 0.0–0.5)
Eosinophils Relative: 3 %
HCT: 26.5 % — ABNORMAL LOW (ref 36.0–46.0)
Hemoglobin: 8.4 g/dL — ABNORMAL LOW (ref 12.0–15.0)
Immature Granulocytes: 1 %
Lymphocytes Relative: 9 %
Lymphs Abs: 0.5 K/uL — ABNORMAL LOW (ref 0.7–4.0)
MCH: 30.3 pg (ref 26.0–34.0)
MCHC: 31.7 g/dL (ref 30.0–36.0)
MCV: 95.7 fL (ref 80.0–100.0)
Monocytes Absolute: 0.5 K/uL (ref 0.1–1.0)
Monocytes Relative: 10 %
Neutro Abs: 4 K/uL (ref 1.7–7.7)
Neutrophils Relative %: 76 %
Platelets: 87 K/uL — ABNORMAL LOW (ref 150–400)
RBC: 2.77 MIL/uL — ABNORMAL LOW (ref 3.87–5.11)
RDW: 15.9 % — ABNORMAL HIGH (ref 11.5–15.5)
WBC: 5.2 K/uL (ref 4.0–10.5)
nRBC: 0 % (ref 0.0–0.2)

## 2024-08-24 LAB — COMPREHENSIVE METABOLIC PANEL WITH GFR
ALT: 14 U/L (ref 0–44)
AST: 26 U/L (ref 15–41)
Albumin: 3.2 g/dL — ABNORMAL LOW (ref 3.5–5.0)
Alkaline Phosphatase: 161 U/L — ABNORMAL HIGH (ref 38–126)
Anion gap: 20 — ABNORMAL HIGH (ref 5–15)
BUN: 64 mg/dL — ABNORMAL HIGH (ref 6–20)
CO2: 21 mmol/L — ABNORMAL LOW (ref 22–32)
Calcium: 6.9 mg/dL — ABNORMAL LOW (ref 8.9–10.3)
Chloride: 101 mmol/L (ref 98–111)
Creatinine, Ser: 16.29 mg/dL — ABNORMAL HIGH (ref 0.44–1.00)
GFR, Estimated: 3 mL/min — ABNORMAL LOW (ref >60)
Glucose, Bld: 86 mg/dL (ref 70–99)
Potassium: 5.4 mmol/L — ABNORMAL HIGH (ref 3.5–5.1)
Sodium: 142 mmol/L (ref 135–145)
Total Bilirubin: 0.7 mg/dL (ref 0.0–1.2)
Total Protein: 6.7 g/dL (ref 6.5–8.1)

## 2024-08-24 LAB — TROPONIN I (HIGH SENSITIVITY): Troponin I (High Sensitivity): 50 ng/L — ABNORMAL HIGH (ref <18)

## 2024-08-24 LAB — BRAIN NATRIURETIC PEPTIDE: B Natriuretic Peptide: 3189 pg/mL — ABNORMAL HIGH (ref 0.0–100.0)

## 2024-08-24 NOTE — ED Notes (Signed)
 See triage notes. Pt a/o. Abd swollen and tight. No resp distress/shob noted. Pt c/o pain all over and in leg

## 2024-08-24 NOTE — Telephone Encounter (Signed)
 Alex - [facility]  Pharmacy is calling to follow up on a refill of medications to be sent to their pharmacy.  Phone: [phone]

## 2024-08-24 NOTE — Telephone Encounter (Signed)
 Attempted to call patient to see if she is in need of any refills

## 2024-08-24 NOTE — ED Notes (Addendum)
 Pt asking for pain medicine- informed pt that she has not been seen by Dr yet, cannot give pain meds without Dr order. Pt watching something on cell phone at this time.

## 2024-08-24 NOTE — ED Triage Notes (Signed)
 Pt to ED from home, pt on dialysis, last tx today, pt had about 4 lbs taken off today, pt says her lupus is flaring up and she has swelling in her abd and legs with joint pain. Pt abdomen is distended. Pt reports this started 3 days ago.

## 2024-08-25 DIAGNOSIS — I5022 Chronic systolic (congestive) heart failure: Secondary | ICD-10-CM | POA: Diagnosis not present

## 2024-08-25 DIAGNOSIS — N2581 Secondary hyperparathyroidism of renal origin: Secondary | ICD-10-CM | POA: Diagnosis not present

## 2024-08-25 DIAGNOSIS — A419 Sepsis, unspecified organism: Secondary | ICD-10-CM | POA: Diagnosis not present

## 2024-08-25 DIAGNOSIS — I132 Hypertensive heart and chronic kidney disease with heart failure and with stage 5 chronic kidney disease, or end stage renal disease: Secondary | ICD-10-CM | POA: Diagnosis not present

## 2024-08-25 DIAGNOSIS — R6520 Severe sepsis without septic shock: Secondary | ICD-10-CM | POA: Diagnosis not present

## 2024-08-25 DIAGNOSIS — G629 Polyneuropathy, unspecified: Secondary | ICD-10-CM

## 2024-08-25 DIAGNOSIS — R188 Other ascites: Secondary | ICD-10-CM

## 2024-08-25 DIAGNOSIS — R9431 Abnormal electrocardiogram [ECG] [EKG]: Secondary | ICD-10-CM | POA: Diagnosis not present

## 2024-08-25 DIAGNOSIS — K7460 Unspecified cirrhosis of liver: Secondary | ICD-10-CM | POA: Diagnosis not present

## 2024-08-25 DIAGNOSIS — A4101 Sepsis due to Methicillin susceptible Staphylococcus aureus: Secondary | ICD-10-CM | POA: Diagnosis not present

## 2024-08-25 DIAGNOSIS — N186 End stage renal disease: Secondary | ICD-10-CM | POA: Diagnosis not present

## 2024-08-25 DIAGNOSIS — J90 Pleural effusion, not elsewhere classified: Secondary | ICD-10-CM | POA: Diagnosis not present

## 2024-08-25 DIAGNOSIS — D696 Thrombocytopenia, unspecified: Secondary | ICD-10-CM | POA: Diagnosis not present

## 2024-08-25 DIAGNOSIS — M3212 Pericarditis in systemic lupus erythematosus: Secondary | ICD-10-CM | POA: Diagnosis not present

## 2024-08-25 DIAGNOSIS — B9689 Other specified bacterial agents as the cause of diseases classified elsewhere: Secondary | ICD-10-CM | POA: Diagnosis not present

## 2024-08-25 DIAGNOSIS — G9341 Metabolic encephalopathy: Secondary | ICD-10-CM | POA: Diagnosis not present

## 2024-08-25 DIAGNOSIS — D631 Anemia in chronic kidney disease: Secondary | ICD-10-CM | POA: Diagnosis not present

## 2024-08-25 DIAGNOSIS — E875 Hyperkalemia: Secondary | ICD-10-CM | POA: Diagnosis not present

## 2024-08-25 DIAGNOSIS — K652 Spontaneous bacterial peritonitis: Secondary | ICD-10-CM | POA: Diagnosis not present

## 2024-08-25 DIAGNOSIS — E871 Hypo-osmolality and hyponatremia: Secondary | ICD-10-CM | POA: Diagnosis not present

## 2024-08-25 DIAGNOSIS — Z992 Dependence on renal dialysis: Secondary | ICD-10-CM

## 2024-08-25 DIAGNOSIS — R569 Unspecified convulsions: Secondary | ICD-10-CM | POA: Diagnosis not present

## 2024-08-25 DIAGNOSIS — E872 Acidosis, unspecified: Secondary | ICD-10-CM | POA: Diagnosis not present

## 2024-08-25 DIAGNOSIS — J45909 Unspecified asthma, uncomplicated: Secondary | ICD-10-CM | POA: Diagnosis not present

## 2024-08-25 DIAGNOSIS — E11649 Type 2 diabetes mellitus with hypoglycemia without coma: Secondary | ICD-10-CM | POA: Diagnosis not present

## 2024-08-25 DIAGNOSIS — E8779 Other fluid overload: Secondary | ICD-10-CM

## 2024-08-25 DIAGNOSIS — E8770 Fluid overload, unspecified: Secondary | ICD-10-CM | POA: Diagnosis not present

## 2024-08-25 LAB — TROPONIN I (HIGH SENSITIVITY): Troponin I (High Sensitivity): 51 ng/L — ABNORMAL HIGH (ref <18)

## 2024-08-25 LAB — CBC
HCT: 24.5 % — ABNORMAL LOW (ref 36.0–46.0)
Hemoglobin: 7.4 g/dL — ABNORMAL LOW (ref 12.0–15.0)
MCH: 29.1 pg (ref 26.0–34.0)
MCHC: 30.2 g/dL (ref 30.0–36.0)
MCV: 96.5 fL (ref 80.0–100.0)
Platelets: 79 K/uL — ABNORMAL LOW (ref 150–400)
RBC: 2.54 MIL/uL — ABNORMAL LOW (ref 3.87–5.11)
RDW: 15.9 % — ABNORMAL HIGH (ref 11.5–15.5)
WBC: 4.8 K/uL (ref 4.0–10.5)
nRBC: 0 % (ref 0.0–0.2)

## 2024-08-25 LAB — BASIC METABOLIC PANEL WITH GFR
Anion gap: 19 — ABNORMAL HIGH (ref 5–15)
BUN: 63 mg/dL — ABNORMAL HIGH (ref 6–20)
CO2: 22 mmol/L (ref 22–32)
Calcium: 6.8 mg/dL — ABNORMAL LOW (ref 8.9–10.3)
Chloride: 100 mmol/L (ref 98–111)
Creatinine, Ser: 16.42 mg/dL — ABNORMAL HIGH (ref 0.44–1.00)
GFR, Estimated: 3 mL/min — ABNORMAL LOW (ref >60)
Glucose, Bld: 99 mg/dL (ref 70–99)
Potassium: 5.1 mmol/L (ref 3.5–5.1)
Sodium: 141 mmol/L (ref 135–145)

## 2024-08-25 LAB — HEPATITIS B SURFACE ANTIGEN: Hepatitis B Surface Ag: NONREACTIVE

## 2024-08-25 LAB — PROTIME-INR
INR: 1.2 (ref 0.8–1.2)
Prothrombin Time: 16.2 s — ABNORMAL HIGH (ref 11.4–15.2)

## 2024-08-25 LAB — PHOSPHORUS: Phosphorus: 7.9 mg/dL — ABNORMAL HIGH (ref 2.5–4.6)

## 2024-08-25 LAB — MAGNESIUM: Magnesium: 2 mg/dL (ref 1.7–2.4)

## 2024-08-25 MED ORDER — ATORVASTATIN CALCIUM 40 MG PO TABS
40.0000 mg | ORAL_TABLET | Freq: Every day | ORAL | Status: DC
  Administered 2024-08-25 – 2024-08-26 (×2): 40 mg via ORAL
  Filled 2024-08-25 (×2): qty 1

## 2024-08-25 MED ORDER — ACETAMINOPHEN 500 MG PO TABS: 500.0000 mg | ORAL_TABLET | Freq: Four times a day (QID) | ORAL | Status: DC | PRN

## 2024-08-25 MED ORDER — POLYETHYLENE GLYCOL 3350 17 G PO PACK: 17.0000 g | PACK | Freq: Every day | ORAL | Status: DC | PRN

## 2024-08-25 MED ORDER — SODIUM CHLORIDE 0.9% FLUSH
3.0000 mL | Freq: Two times a day (BID) | INTRAVENOUS | Status: DC
Start: 2024-08-25 — End: 2024-08-26
  Administered 2024-08-25 – 2024-08-26 (×3): 3 mL via INTRAVENOUS

## 2024-08-25 MED ORDER — LOSARTAN POTASSIUM 25 MG PO TABS
100.0000 mg | ORAL_TABLET | Freq: Every day | ORAL | Status: DC
  Administered 2024-08-25: 100 mg via ORAL
  Filled 2024-08-25: qty 4

## 2024-08-25 MED ORDER — MORPHINE SULFATE (PF) 4 MG/ML IV SOLN
4.0000 mg | Freq: Once | INTRAVENOUS | Status: AC
  Administered 2024-08-25: 4 mg via INTRAVENOUS
  Filled 2024-08-25: qty 1

## 2024-08-25 MED ORDER — LEVETIRACETAM 500 MG PO TABS
500.0000 mg | ORAL_TABLET | Freq: Every day | ORAL | Status: DC
  Administered 2024-08-26: 500 mg via ORAL
  Filled 2024-08-25: qty 1

## 2024-08-25 MED ORDER — PROCHLORPERAZINE EDISYLATE 10 MG/2ML IJ SOLN
10.0000 mg | Freq: Once | INTRAMUSCULAR | Status: AC
  Administered 2024-08-25: 10 mg via INTRAVENOUS
  Filled 2024-08-25: qty 2

## 2024-08-25 MED ORDER — AMLODIPINE BESYLATE 5 MG PO TABS
10.0000 mg | ORAL_TABLET | Freq: Every day | ORAL | Status: DC
  Administered 2024-08-25 – 2024-08-26 (×2): 10 mg via ORAL
  Filled 2024-08-25 (×2): qty 2

## 2024-08-25 MED ORDER — CLONIDINE HCL 0.2 MG PO TABS
0.3000 mg | ORAL_TABLET | Freq: Two times a day (BID) | ORAL | Status: DC
  Administered 2024-08-25 – 2024-08-26 (×4): 0.3 mg via ORAL
  Filled 2024-08-25 (×4): qty 1

## 2024-08-25 MED ORDER — ALBUTEROL SULFATE (2.5 MG/3ML) 0.083% IN NEBU: 2.5000 mg | INHALATION_SOLUTION | RESPIRATORY_TRACT | Status: DC | PRN

## 2024-08-25 MED ORDER — SODIUM ZIRCONIUM CYCLOSILICATE 5 G PO PACK
10.0000 g | PACK | Freq: Once | ORAL | Status: AC
  Administered 2024-08-25: 10 g via ORAL
  Filled 2024-08-25: qty 2

## 2024-08-25 MED ORDER — GABAPENTIN 100 MG PO CAPS
100.0000 mg | ORAL_CAPSULE | ORAL | Status: DC
  Administered 2024-08-25: 100 mg via ORAL
  Filled 2024-08-25: qty 1

## 2024-08-25 MED ORDER — CARVEDILOL 12.5 MG PO TABS
12.5000 mg | ORAL_TABLET | Freq: Two times a day (BID) | ORAL | Status: DC
  Administered 2024-08-25 – 2024-08-26 (×3): 12.5 mg via ORAL
  Filled 2024-08-25 (×3): qty 1

## 2024-08-25 MED ORDER — SODIUM CHLORIDE 0.9% FLUSH: 10.0000 mL | INTRAVENOUS | Status: DC | PRN

## 2024-08-25 MED ORDER — CHLORHEXIDINE GLUCONATE CLOTH 2 % EX PADS
6.0000 | MEDICATED_PAD | Freq: Every day | CUTANEOUS | Status: DC
  Administered 2024-08-25 – 2024-08-26 (×2): 6 via TOPICAL

## 2024-08-25 MED ORDER — CALCIUM GLUCONATE-NACL 1-0.675 GM/50ML-% IV SOLN
1.0000 g | Freq: Once | INTRAVENOUS | Status: AC
  Administered 2024-08-25: 1000 mg via INTRAVENOUS
  Filled 2024-08-25: qty 50

## 2024-08-25 MED ORDER — ONDANSETRON HCL 4 MG/2ML IJ SOLN: 4.0000 mg | Freq: Four times a day (QID) | INTRAMUSCULAR | Status: DC | PRN

## 2024-08-25 MED ORDER — MELATONIN 3 MG PO TABS: 6.0000 mg | ORAL_TABLET | Freq: Every evening | ORAL | Status: DC | PRN

## 2024-08-25 MED ORDER — LEVETIRACETAM 250 MG PO TABS
250.0000 mg | ORAL_TABLET | ORAL | Status: DC
  Administered 2024-08-25: 250 mg via ORAL
  Filled 2024-08-25: qty 1

## 2024-08-25 MED ORDER — OXYCODONE HCL 5 MG PO TABS: 2.5000 mg | ORAL_TABLET | Freq: Four times a day (QID) | ORAL | Status: DC | PRN

## 2024-08-25 MED ORDER — OXYCODONE HCL 5 MG PO TABS
5.0000 mg | ORAL_TABLET | Freq: Four times a day (QID) | ORAL | Status: DC | PRN
  Administered 2024-08-25 – 2024-08-26 (×6): 5 mg via ORAL
  Filled 2024-08-25 (×6): qty 1

## 2024-08-25 MED ORDER — SODIUM CHLORIDE 0.9% FLUSH
10.0000 mL | Freq: Two times a day (BID) | INTRAVENOUS | Status: DC
  Administered 2024-08-25 (×2): 10 mL

## 2024-08-25 MED ORDER — ASPIRIN 81 MG PO CHEW
81.0000 mg | CHEWABLE_TABLET | Freq: Every day | ORAL | Status: DC
  Administered 2024-08-25 – 2024-08-26 (×2): 81 mg via ORAL
  Filled 2024-08-25 (×2): qty 1

## 2024-08-25 NOTE — ED Provider Notes (Signed)
 [place] EMERGENCY DEPARTMENT AT [facility] Provider Note   CSN: [id number] Arrival date & time: 08/24/24  2108     Patient presents with: lupus flare up   Cathy [name] is a 43 y.o. female.   Patient is a 43 year old female with extensive past medical history including lupus, cirrhosis, end-stage renal disease on hemodialysis, hypertension.  Patient sent from dialysis for evaluation of generalized pain, swelling, and difficulty breathing.  According to the patient, she has attended partial dialysis recently due to having to take her daughter to school.  She describes being swollen all over, swelling in her abdomen, and generalized pain.  No fevers or chills.       Prior to Admission medications   Medication Sig Start Date End Date Taking? Authorizing Provider  acetaminophen  (TYLENOL ) 500 MG tablet Take 1,000 mg by mouth every 6 (six) hours as needed for mild pain (pain score 1-3).    [provider]  albuterol  (VENTOLIN  HFA) 108 (90 Base) MCG/ACT inhaler Inhale 2 puffs into the lungs every 6 (six) hours as needed for wheezing or shortness of breath. 12/18/23   Del Orbe Polanco, Iliana, FNP  amLODipine  (NORVASC ) 10 MG tablet Take 1 tablet (10 mg total) by mouth in the morning. 06/16/24   Del Wilhelmena Lloyd Sola, FNP  aspirin  81 MG chewable tablet Chew 1 tablet (81 mg total) by mouth daily. 06/04/24   Maree, Pratik D, DO  atorvastatin  (LIPITOR) 40 MG tablet Take 1 tablet (40 mg total) by mouth daily. 06/04/24   Maree, Pratik D, DO  b complex-vitamin c-folic acid  (NEPHRO-VITE) 0.8 MG TABS tablet Take 1 tablet by mouth daily. 04/22/24   [provider]  Blood Pressure Monitoring (OMRON 3 SERIES BP MONITOR) DEVI Use as directed 2 times a day 08/27/23   Del Wilhelmena Lloyd, Sola, FNP  busPIRone  (BUSPAR ) 7.5 MG tablet Take 1 tablet (7.5 mg total) by mouth daily. 06/02/24   Maree, Pratik D, DO  camphor-menthol  VIKKI) lotion Apply topically as needed for itching. 02/23/24    Tobie Yetta HERO, MD  carvedilol  (COREG ) 12.5 MG tablet Take 1 tablet (12.5 mg total) by mouth 2 (two) times daily with a meal. 06/16/24   Del Wilhelmena Lloyd, Sola, FNP  cloNIDine  (CATAPRES ) 0.3 MG tablet Take 1 tablet (0.3 mg total) by mouth 2 (two) times daily. 06/16/24   Del Orbe Polanco, Iliana, FNP  gabapentin  (NEURONTIN ) 300 MG capsule Take 1 capsule (300 mg total) by mouth at bedtime. 06/16/24   Del Orbe Polanco, Iliana, FNP  hydrALAZINE  (APRESOLINE ) 100 MG tablet Take 1 tablet (100 mg total) by mouth 3 (three) times daily. 06/16/24   Del Orbe Polanco, Iliana, FNP  hydrOXYzine  (ATARAX ) 25 MG tablet Take 25 mg by mouth every 12 (twelve) hours as needed. 03/30/24   [provider]  levETIRAcetam  (KEPPRA ) 250 MG tablet Take 2 tablets (500 mg total) by mouth daily AND 1 tablet (250 mg total) every Monday, Wednesday, and Friday with hemodialysis. 06/03/24 08/02/24  Maree, Pratik D, DO  lidocaine  (XYLOCAINE ) 2 % solution Use as directed 15 mLs in the mouth or throat as needed. 06/16/24   Del Orbe Polanco, Iliana, FNP  losartan  (COZAAR ) 50 MG tablet Take 1 tablet (50 mg total) by mouth at bedtime. 06/16/24   Del Orbe Polanco, Sola, FNP  pantoprazole  (PROTONIX ) 40 MG tablet TAKE 1 TABLET BY MOUTH DAILY BEFORE BREAKFAST 06/02/24   Del Orbe Polanco, Iliana, FNP    Allergies: Dialyvite 800 [nephro-vite]  Review of Systems  All other systems reviewed and are negative.   Updated Vital Signs BP (!) 186/110   Pulse 70   Temp 99 F (37.2 C) (Oral)   Resp 20   Ht 5' 7 (1.702 m)   Wt 66.2 kg   LMP 04/07/2020 (Within Months)   SpO2 96%   BMI 22.86 kg/m   Physical Exam Vitals and nursing note reviewed.  Constitutional:      General: She is not in acute distress.    Appearance: She is well-developed. She is not diaphoretic.  HENT:     Head: Normocephalic and atraumatic.  Cardiovascular:     Rate and Rhythm: Normal rate and regular rhythm.     Heart sounds: No murmur heard.    No friction  rub. No gallop.  Pulmonary:     Effort: Pulmonary effort is normal. No respiratory distress.     Breath sounds: Normal breath sounds. No wheezing.  Abdominal:     General: Bowel sounds are normal. There is no distension.     Palpations: Abdomen is soft.     Tenderness: There is no abdominal tenderness.     Comments: Abdomen is distended and firm.  There is generalized tenderness.  Musculoskeletal:        General: Normal range of motion.     Cervical back: Normal range of motion and neck supple.     Right lower leg: Edema present.     Left lower leg: Edema present.  Skin:    General: Skin is warm and dry.  Neurological:     General: No focal deficit present.     Mental Status: She is alert and oriented to person, place, and time.     (all labs ordered are listed, but only abnormal results are displayed) Labs Reviewed  COMPREHENSIVE METABOLIC PANEL WITH GFR - Abnormal; Notable for the following components:      Result Value   Potassium 5.4 (*)    CO2 21 (*)    BUN 64 (*)    Creatinine, Ser 16.29 (*)    Calcium  6.9 (*)    Albumin  3.2 (*)    Alkaline Phosphatase 161 (*)    GFR, Estimated 3 (*)    Anion gap 20 (*)    All other components within normal limits  CBC WITH DIFFERENTIAL/PLATELET - Abnormal; Notable for the following components:   RBC 2.77 (*)    Hemoglobin 8.4 (*)    HCT 26.5 (*)    RDW 15.9 (*)    Platelets 87 (*)    Lymphs Abs 0.5 (*)    All other components within normal limits  BRAIN NATRIURETIC PEPTIDE - Abnormal; Notable for the following components:   B Natriuretic Peptide 3,189.0 (*)    All other components within normal limits  TROPONIN I (HIGH SENSITIVITY) - Abnormal; Notable for the following components:   Troponin I (High Sensitivity) 50 (*)    All other components within normal limits  TROPONIN I (HIGH SENSITIVITY)    EKG: EKG Interpretation Date/Time:  Wednesday August 24 2024 23:04:29 EDT Ventricular Rate:  70 PR Interval:  171 QRS  Duration:  75 QT Interval:  454 QTC Calculation: 490 R Axis:   67  Text Interpretation: Sinus rhythm Left atrial enlargement Low voltage, extremity leads Confirmed by Geroldine Berg ([id number]) on 08/24/2024 11:21:57 PM  Radiology: [facility] Chest Port 1 View Result Date: 08/24/2024 CLINICAL DATA:  Edema for 3 days. Lupus flare up. Headache and nausea. Chest and rib  pain. EXAM: PORTABLE CHEST 1 VIEW COMPARISON:  05/31/2024 FINDINGS: Left central venous catheter with tip over the cavoatrial junction region. No pneumothorax. Cardiac enlargement. Shallow inspiration with atelectasis or fibrosis in the lung bases. No airspace disease or consolidation in the lungs. No pneumothorax. Mediastinal contours appear intact. IMPRESSION: Shallow inspiration with atelectasis in the lung bases. Cardiac enlargement. No focal consolidation. Electronically Signed   By: Elsie Gravely M.D.   On: 08/24/2024 22:54     Procedures   Medications Ordered in the ED  morphine  (PF) 4 MG/ML injection 4 mg (has no administration in time range)  prochlorperazine  (COMPAZINE ) injection 10 mg (has no administration in time range)                                    Medical Decision Making Risk Prescription drug management. Decision regarding hospitalization.   Patient with extensive past medical history as per HPI presenting with shortness of breath and abdominal distention.  She arrives here with stable vital signs and is afebrile.  Laboratory studies obtained including CBC, basic metabolic panel.  Studies are consistent with baseline dialysis.  BNP is elevated at 3100 and troponin mildly elevated at 50.  Chest x-ray showing atelectasis and cardiac enlargement, but no focal consolidation.  I feel as though the patient's most pressing issue is her abdominal distention.  Her abdomen is very taut and firm and likely requires paracentesis.  I have discussed care with Dr. Segars and patient will be admitted.  IR to be consulted in  the morning for paracentesis.     Final diagnoses:  None    ED Discharge Orders     None          Geroldine Berg, MD 08/25/24 [phone]

## 2024-08-25 NOTE — Progress Notes (Signed)
 Pt receives out-pt HD at [facility], as of 9/25 she is on a TTS schedule now, 1015 chair time. Clinic states that pt has now been informed of this schedule change.  [facility] [phone]

## 2024-08-25 NOTE — TOC CM/SW Note (Signed)
 Transition of Care [facility]) - Inpatient Brief Assessment  Patient Details  Name: Cathy [name] MRN: [id number] Date of Birth: [birth year]  Transition of Care [facility]) CM/SW Contact:    Lucie Lunger, LCSWA Phone Number: 08/25/2024, 9:26 AM  Clinical Narrative: Transition of Care Department [facility]) has reviewed patient and no TOC needs have been identified at this time. We will continue to monitor patient advancement through interdiciplinary progression rounds. If new patient transition needs arise, please place a TOC consult.  Transition of Care Asessment: Insurance and Status: Insurance coverage has been reviewed Patient has primary care physician: Yes Home environment has been reviewed: From home Prior level of function:: Independent Prior/Current Home Services: No current home services Social Drivers of Health Review: SDOH reviewed no interventions necessary Readmission risk has been reviewed: Yes Transition of care needs: no transition of care needs at this time

## 2024-08-25 NOTE — Progress Notes (Signed)
 Patient seen and examined; admitted after midnight secondary to shortness of breath, fluid overload and increased abdominal girth distention.  Patient with underlying history of ESRD on dialysis (Monday-Wednesday-Friday); also with history of diastolic heart failure, NASH and lupus.  Denying chest pain and reporting some orthopnea and worsening ascites.  Please refer to H&P written by Dr. Segars for further reinforced/details on admission.   Plan - Nephrology service has been consulted for assistance with dialysis management and dry weight challenges. -Follow-up results for IR paracentesis -Continue supportive care and closely follow electrolytes/hemoglobin stability. -Hemodynamically stable at the moment.  Eric Nunnery MD [phone]

## 2024-08-25 NOTE — H&P (Addendum)
 History and Physical    Cathy [name] FMW:[id number] DOB: 03-Nov-1981 DOA: 08/24/2024  PCP: Terry Wilhelmena Lloyd Hilario, FNP   Patient coming from: Home   Chief Complaint:  Chief Complaint  Patient presents with   lupus flare up    HPI:  Cathy [name] is a 43 y.o. female with hx of ESRD on HD (MWF), prior PE, NASH, systolic and diastolic CHF, hypertension, and SLE, who presented from dialysis with SOB, abd distension and diffuse pain. Reports had been receiving partial HD treatments as she needed to leave early to pick up daughter from school, unclear when last had a full session. She says they just fixed her schedule so she will be able to have full sessions from now on. Abd has been distended for months, and she was unsure when last had paracentesis (appears last in 5/'25). Otherwise she reports pain mainly in her lower extremities, described as electric pain even to light touch extends all throughout lower extremities. She otherwise has joint pain in her ankles, feet > hands, and mid back. She attributes all these to lupus type pain flaring back up.  Otherwise she reports persistent SOB, and sharp central chest pain which is nonexertional, worse with left lateral decubitus positioning.    Review of Systems:  ROS complete and negative except as marked above   Allergies  Allergen Reactions   Dialyvite 800 [Nephro-Vite] Nausea And Vomiting    Prior to Admission medications   Medication Sig Start Date End Date Taking? Authorizing Provider  acetaminophen  (TYLENOL ) 500 MG tablet Take 1,000 mg by mouth every 6 (six) hours as needed for mild pain (pain score 1-3).    [provider]  albuterol  (VENTOLIN  HFA) 108 (90 Base) MCG/ACT inhaler Inhale 2 puffs into the lungs every 6 (six) hours as needed for wheezing or shortness of breath. 12/18/23   Del Wilhelmena Lloyd Hilario, FNP  amLODipine  (NORVASC ) 10 MG tablet Take 1 tablet (10 mg total) by mouth in the morning. 06/16/24   Del Wilhelmena Lloyd Hilario, FNP  aspirin  81 MG chewable tablet Chew 1 tablet (81 mg total) by mouth daily. 06/04/24   Maree, Pratik D, DO  atorvastatin  (LIPITOR) 40 MG tablet Take 1 tablet (40 mg total) by mouth daily. 06/04/24   Maree, Pratik D, DO  b complex-vitamin c-folic acid  (NEPHRO-VITE) 0.8 MG TABS tablet Take 1 tablet by mouth daily. 04/22/24   [provider]  Blood Pressure Monitoring (OMRON 3 SERIES BP MONITOR) DEVI Use as directed 2 times a day 08/27/23   Del Wilhelmena Lloyd Hilario, FNP  busPIRone  (BUSPAR ) 7.5 MG tablet Take 1 tablet (7.5 mg total) by mouth daily. 06/02/24   Maree, Pratik D, DO  camphor-menthol  VIKKI) lotion Apply topically as needed for itching. 02/23/24   Tobie Yetta HERO, MD  carvedilol  (COREG ) 12.5 MG tablet Take 1 tablet (12.5 mg total) by mouth 2 (two) times daily with a meal. 06/16/24   Del Wilhelmena Lloyd, Hilario, FNP  cloNIDine  (CATAPRES ) 0.3 MG tablet Take 1 tablet (0.3 mg total) by mouth 2 (two) times daily. 06/16/24   Del Wilhelmena Lloyd Hilario, FNP  gabapentin  (NEURONTIN ) 300 MG capsule Take 1 capsule (300 mg total) by mouth at bedtime. 06/16/24   Del Orbe Polanco, Iliana, FNP  hydrALAZINE  (APRESOLINE ) 100 MG tablet Take 1 tablet (100 mg total) by mouth 3 (three) times daily. 06/16/24   Del Orbe Polanco, Iliana, FNP  hydrOXYzine  (ATARAX ) 25 MG tablet Take 25 mg by mouth every 12 (twelve)  hours as needed. 03/30/24   [provider]  levETIRAcetam  (KEPPRA ) 250 MG tablet Take 2 tablets (500 mg total) by mouth daily AND 1 tablet (250 mg total) every Monday, Wednesday, and Friday with hemodialysis. 06/03/24 08/02/24  Maree, Pratik D, DO  lidocaine  (XYLOCAINE ) 2 % solution Use as directed 15 mLs in the mouth or throat as needed. 06/16/24   Del Orbe Polanco, Iliana, FNP  losartan  (COZAAR ) 50 MG tablet Take 1 tablet (50 mg total) by mouth at bedtime. 06/16/24   Del Wilhelmena Lloyd Sola, FNP  pantoprazole  (PROTONIX ) 40 MG tablet TAKE 1 TABLET BY MOUTH DAILY BEFORE BREAKFAST 06/02/24   Del Wilhelmena Lloyd Sola, FNP    Past Medical History:  Diagnosis Date   Anasarca associated with disorder of kidney 06/08/2021   Anxiety    Asthma    Bipolar depression (HCC)    Cirrhosis (HCC)    Depression    Dyspnea    ESRD (end stage renal disease) on dialysis (HCC) 10/2020   GERD (gastroesophageal reflux disease)    Gonorrhea    Lupus    Pelvic inflammatory disease (PID)    Pleural effusion 06/08/2021   Pulmonary embolism (HCC) 01/31/2022   Renal hypertension    Trichomonas infection     Past Surgical History:  Procedure Laterality Date   A/V FISTULAGRAM N/A 02/22/2024   Procedure: A/V Fistulagram;  Surgeon: Sheree Penne Bruckner, MD;  Location: [facility] INVASIVE CV LAB;  Service: Cardiovascular;  Laterality: N/A;   AV FISTULA PLACEMENT Left 06/10/2022   Procedure: LEFT ARM ARTERIOVENOUS (AV) FISTULA CREATION;  Surgeon: Oris Krystal FALCON, MD;  Location: [facility] ORS;  Service: Vascular;  Laterality: Left;   BASCILIC VEIN TRANSPOSITION Left 03/24/2023   Procedure: LEFT ARM SECOND STAGE BASILIC VEIN TRANSPOSITION;  Surgeon: Oris Krystal FALCON, MD;  Location: [facility] ORS;  Service: Vascular;  Laterality: Left;   CENTRAL LINE INSERTION-TUNNELED N/A 02/22/2024   Procedure: CENTRAL LINE INSERTION-TUNNELED;  Surgeon: Sheree Penne Bruckner, MD;  Location: [facility] INVASIVE CV LAB;  Service: Cardiovascular;  Laterality: N/A;   ESOPHAGEAL DILATION N/A 05/10/2024   Procedure: DILATION, ESOPHAGUS;  Surgeon: Eartha Angelia Sieving, MD;  Location: [facility] ENDO SUITE;  Service: Gastroenterology;  Laterality: N/A;   ESOPHAGOGASTRODUODENOSCOPY N/A 05/10/2024   Procedure: EGD (ESOPHAGOGASTRODUODENOSCOPY);  Surgeon: Eartha Angelia, Sieving, MD;  Location: [facility] END[facility] SU[facility];  Service: Gastroenterology;  Laterality: N/A;  8:45 am, asa 3  dialysis M,W & F   IR FLUORO GUIDE CV LINE RIGHT  11/30/2020   IR FLUORO GUIDE CV LINE RIGHT  06/13/2021   IR FLUORO GUIDE CV LINE RIGHT  02/01/2022   IR REMOVAL TUN CV CATH W/O FL  06/11/2021   IR  REMOVAL TUN CV CATH W/O FL  01/29/2022   IR US  GUIDE VASC ACCESS RIGHT  11/30/2020   IR US  GUIDE VASC ACCESS RIGHT  06/13/2021   IR US  GUIDE VASC ACCESS RIGHT  02/03/2022   RENAL BIOPSY     TEE WITHOUT CARDIOVERSION N/A 06/13/2021   Procedure: TRANSESOPHAGEAL ECHOCARDIOGRAM (TEE);  Surgeon: Delford Maude BROCKS, MD;  Location[facility] ENDOSCOPY;  Service: Cardiovascular;  Laterality: N/A;   TEE WITHOUT CARDIOVERSION N/A 02/03/2022   Procedure: TRANSESOPHAGEAL ECHOCARDIOGRAM (TEE);  Surgeon: Tobb, Kardie, DO;  Location: [facility] ENDOSCOPY;  Service: Cardiovascular;  Laterality: N/A;   TUBAL LIGATION N/A 02/03/2019   Procedure: POST PARTUM TUBAL LIGATION;  Surgeon: Izell Harari, MD;  Location: [facility] LD ORS;  Service: Gynecology;  Laterality: N/A;     reports that she has been  smoking cigarettes. She started smoking about 25 years ago. She has a 6.4 pack-year smoking history. She has never used smokeless tobacco. She reports that she does not currently use alcohol. She reports current drug use. Frequency: 3.00 times per week. Drug: Marijuana.  Family History  Problem Relation Age of Onset   Anxiety disorder Mother    Sleep apnea Father    Diabetes Maternal Grandmother    Hypertension Maternal Grandmother    Diabetes Maternal Grandfather    Hypertension Maternal Grandfather    Diabetes Paternal Grandmother    Hypertension Paternal Grandmother    Diabetes Paternal Grandfather    Hypertension Paternal Grandfather      Physical Exam: Vitals:   08/24/24 2330 08/24/24 2341 08/25/24 0030 08/25/24 0130  BP:  (!) 186/110 (!) 176/119 (!) 166/100  Pulse: 69 70 70 71  Resp: 15 20 18 17   Temp:    99 F (37.2 C)  TempSrc:      SpO2: 96% 96% 100% 92%  Weight:      Height:        Gen: Awake, alert, chronically ill appearing.   CV: Regular, normal S1, S2, 1/6 SEM Resp: Normal WOB, Diminished in bases, otherwise clear.  Abd: tense and distended, dull, normoactive, mild diffuse tenderness.  MSK: Symmetric, 1+  pitting edema. No significant synovitis of joints of the hands or feet.  Skin: somewhat cushingoid appearance, depigmentation of lips. No other rashes or lesions.  Neuro: Alert and interactive, allodynia lower ext  Psych: euthymic, appropriate    Data review:   Labs reviewed, notable for:   Creatinine 16 (ESRD K5.4 Bicarb 21, AG 20 BUN 64 Ca 7.5 corrected  Alk phos 161, other LFT within normal limit BNP 3189 High-sensitivity Trop 50 -> 51 Hemoglobin 9.4, normocytic Platelet 87   Micro:  Results for orders placed or performed during the hospital encounter of 05/31/24  MRSA Next Gen by PCR, Nasal     Status: None   Collection Time: 05/31/24  7:22 AM   Specimen: Nasal Mucosa; Nasal Swab  Result Value Ref Range Status   MRSA by PCR Next Gen NOT DETECTED NOT DETECTED Final    Comment: (NOTE) The GeneXpert MRSA Assay (FDA approved for NASAL specimens only), is one component of a comprehensive MRSA colonization surveillance program. It is not intended to diagnose MRSA infection nor to guide or monitor treatment for MRSA infections. Test performance is not FDA approved in patients less than 60 years old. Performed at [facility], [address], [place], [place] [address]     Imaging reviewed:  [facility] Chest [facility] 1 View Result Date: 08/24/2024 CLINICAL DATA:  Edema for 3 days. Lupus flare up. Headache and nausea. Chest and rib pain. EXAM: PORTABLE CHEST 1 VIEW COMPARISON:  05/31/2024 FINDINGS: Left central venous catheter with tip over the cavoatrial junction region. No pneumothorax. Cardiac enlargement. Shallow inspiration with atelectasis or fibrosis in the lung bases. No airspace disease or consolidation in the lungs. No pneumothorax. Mediastinal contours appear intact. IMPRESSION: Shallow inspiration with atelectasis in the lung bases. Cardiac enlargement. No focal consolidation. Electronically Signed   By: Elsie Gravely M.D.   On: 08/24/2024 22:54    EKG:  Personally reviewed,  sinus rhythm, borderline LAE, artifact limiting interpretation, no overt ischemic changes, possible ST depression inferolaterally.  ED Course:  Treated with morphine , Compazine    Assessment/Plan:  43 y.o. female with hx ESRD on HD (MWF), prior PE, hx decompensated cirrhosis with recurrent ascites, systolic and diastolic CHF,  hypertension, and SLE, who presented from dialysis with SOB, abd distension, admitted with volume overload, symptomatic tense ascites.   Volume overload ESRD MWF HD  Hyperkalemia AGMA  Elevated BUN  Recent partial HD sessions due to scheduling conflict, needing to pick up daughter from school.  Associated volume overload, recurrent tense ascites per below.  Lab abnormalities per above; K 5.4, Bicarb 21 with AG 20, BUN 64.  Chest x-ray without significant pulmonary edema.  - Routine nephrology consult in a.m. for HD management  Decompensated Cirrhosis, with recurrent tense ascites Has established with GI as outpatient, she was planned to have prn order for paracentesis every 2 weeks but does not appear having this done regularly as outpatient. ? Last paracentesis 5/12 with 3.8 L out.  -IR order for therapeutic paracentesis, if unavailable can have general surgery perform  -Likely will need albumin  depending on fluid removed -Volume management per HD, will need routine paracentesis scheduled as outpatient to avoid readmission for this problem -Follow-up with GI/hepatology outpatient  Acute myocardial injury Atypical chest pain, worse with lateral decubitus positioning.  High-sensitivity troponin is flat 50 -> 51.  EKG limited by artifact but without overt ischemic changes.  Suspect demand ischemia in the setting of volume overload, less likely myopericarditis but may consider if chest pain is persistent despite fluid management  - Management directed at volume status per above --Consider echo r/o pericardial effusion   Borderline severe HypoCa  -Give 1 g IV calcium   gluconate  Suspect neuropathic pain affecting lower extremities  -- Trial gabapentin  100 mg three times per week after HD   Chronic medical problems: History of PE: No longer on AC   Systolic and diastolic heart failure: Last echo 7/'25 LVEF 60 to 65%, severe asymmetric hypertrophy of the lateral segment, indeterminate diastolic parameters deep RV trabeculations, ? RV noncompaction, moderately reduced RV systolic function, biatrial enlargement, small LVOT.  Volume management per dialysis Hypertension: Continue home amlodipine , Coreg , clonidine , losartan .  SLE: Reports flare of symptoms; will need expedited OP f/u. Check DS-DNA. Consider for steroid course. For now Oxycodone  2.5 / 5 mg q 6 hr for mod/severe. will try to avoid escalation in opiate therapy  Seizure disorder: Continue home Keppra   Hx of CVA: continue home aspirin , atorvastatin .    Body mass index is 22.86 kg/m.    DVT prophylaxis:  SCDs Code Status:  Full Code Diet:  Diet Orders (From admission, onward)    None      Family Communication:  None   Consults:  None   Admission status:   Inpatient, Telemetry bed  Severity of Illness: The appropriate patient status for this patient is INPATIENT. Inpatient status is judged to be reasonable and necessary in order to provide the required intensity of service to ensure the patient's safety. The patient's presenting symptoms, physical exam findings, and initial radiographic and laboratory data in the context of their chronic comorbidities is felt to place them at high risk for further clinical deterioration. Furthermore, it is not anticipated that the patient will be medically stable for discharge from the hospital within 2 midnights of admission.   * I certify that at the point of admission it is my clinical judgment that the patient will require inpatient hospital care spanning beyond 2 midnights from the point of admission due to high intensity of service, high risk for further  deterioration and high frequency of surveillance required.*   Dorn Dawson, MD [facility]  How to contact the [facility] Attending or Consulting provider  7A - 7P or covering provider during after hours 7P -7A, for this patient.  Check the care team in [facility] and look for a) attending/consulting [facility] provider listed and b) the [facility] team listed Log into [email or web address] and use [facility] universal password to access. If you do not have the password, please contact the hospital operator. Locate the [facility] provider you are looking for under [facility] and page to a number that you can be directly reached. If you still have difficulty reaching the provider, please page the [facility] (Director on Call) for the Hospitalists listed on amion for assistance.  08/25/2024, 1:43 AM

## 2024-08-25 NOTE — ED Notes (Signed)
 ED Provider at bedside.

## 2024-08-25 NOTE — ED Notes (Signed)
 Pt told this nurse while Dr Geroldine at bedside that she has been going to dialysis, but only staying part of the time, b/c she has to get kids from school

## 2024-08-25 NOTE — Consult Note (Signed)
 Reason for Consult: To manage dialysis and dialysis related needs Referring Physician: Dr. Ricky Guadelupe Cathy [name] is an 43 y.o. female.  HPI: 69F PMH ESKD on HD MWF, prior PE, NASH, systolic/diastolic HF, HTN, SLE.  Presents from HD with SOB, abd distension, diffuse pain.  Receiving partial HD treatments.    Seen in her room.  She keeps falling asleep due to recent pain medication received.  States she keeps missing HD due to family reasons.  Unable to recall her last full treatment.  States she makes urine.  Not short of breath.  Otherwise feels okay.   Spoke to [employer].  Confirms she only went to HD yesterday for 2h.  Has been non adherent for a while.    Center:  [facility] on MWF . EDW 69 kg HD Bath 1K/2.5Ca, Na 138 Time 4:00 Heparin  500 bolus then 500 units/hr. Access LUE AVF BFR 400 DFR 600, 15g 1in, Nipro 17H, Temp 37  Mircera 90mcg q2w Venofer  50mg  qTuesday   Past Medical History:  Diagnosis Date   Anasarca associated with disorder of kidney 06/08/2021   Anxiety    Asthma    Bipolar depression (HCC)    Cirrhosis (HCC)    Depression    Dyspnea    ESRD (end stage renal disease) on dialysis (HCC) 10/2020   GERD (gastroesophageal reflux disease)    Gonorrhea    Lupus    Pelvic inflammatory disease (PID)    Pleural effusion 06/08/2021   Pulmonary embolism (HCC) 01/31/2022   Renal hypertension    Trichomonas infection     Past Surgical History:  Procedure Laterality Date   A/V FISTULAGRAM N/A 02/22/2024   Procedure: A/V Fistulagram;  Surgeon: Sheree Penne Bruckner, MD;  Location: [facility] INVASIVE CV LAB;  Service: Cardiovascular;  Laterality: N/A;   AV FISTULA PLACEMENT Left 06/10/2022   Procedure: LEFT ARM ARTERIOVENOUS (AV) FISTULA CREATION;  Surgeon: Oris Krystal FALCON, MD;  Location: [facility] ORS;  Service: Vascular;  Laterality: Left;   BASCILIC VEIN TRANSPOSITION Left 03/24/2023   Procedure: LEFT ARM SECOND STAGE BASILIC VEIN TRANSPOSITION;  Surgeon: Oris Krystal FALCON,  MD;  Location: [facility] ORS;  Service: Vascular;  Laterality: Left;   CENTRAL LINE INSERTION-TUNNELED N/A 02/22/2024   Procedure: CENTRAL LINE INSERTION-TUNNELED;  Surgeon: Sheree Penne Bruckner, MD;  Location: [facility] INVASIVE CV LAB;  Service: Cardiovascular;  Laterality: N/A;   ESOPHAGEAL DILATION N/A 05/10/2024   Procedure: DILATION, ESOPHAGUS;  Surgeon: Eartha Angelia Sieving, MD;  Location: [facility] ENDO SUITE;  Service: Gastroenterology;  Laterality: N/A;   ESOPHAGOGASTRODUODENOSCOPY N/A 05/10/2024   Procedure: EGD (ESOPHAGOGASTRODUODENOSCOPY);  Surgeon: Eartha Angelia, Sieving, MD;  Location: [facility];  Service: Gastroenterology;  Laterality: N/A;  8:45 am, asa 3  dialysis M,W & F   IR FLUORO GUIDE CV LINE RIGHT  11/30/2020   IR FLUORO GUIDE CV LINE RIGHT  06/13/2021   IR FLUORO GUIDE CV LINE RIGHT  02/01/2022   IR REMOVAL TUN CV CATH W/O FL  06/11/2021   IR REMOVAL TUN CV CATH W/O FL  01/29/2022   IR US  GUIDE VASC ACCESS RIGHT  11/30/2020   IR US  GUIDE VASC ACCESS RIGHT  06/13/2021   IR US  GUIDE VASC ACCESS RIGHT  02/03/2022   RENAL BIOPSY     TEE WITHOUT CARDIOVERSION N/A 06/13/2021   Procedure: TRANSESOPHAGEAL ECHOCARDIOGRAM (TEE);  Surgeon: Delford Maude BROCKS, MD;  Location: [facility] ENDOSCOPY;  Service: Cardiovascular;  Laterality: N/A;   TEE WITHOUT CARDIOVERSION N/A 02/03/2022   Procedure: TRANSESOPHAGEAL  ECHOCARDIOGRAM (TEE);  Surgeon: Tobb, Kardie, DO;  Location: [facility] ENDOSCOPY;  Service: Cardiovascular;  Laterality: N/A;   TUBAL LIGATION N/A 02/03/2019   Procedure: POST PARTUM TUBAL LIGATION;  Surgeon: Izell Harari, MD;  Location: [facility] LD ORS;  Service: Gynecology;  Laterality: N/A;    Family History  Problem Relation Age of Onset   Anxiety disorder Mother    Sleep apnea Father    Diabetes Maternal Grandmother    Hypertension Maternal Grandmother    Diabetes Maternal Grandfather    Hypertension Maternal Grandfather    Diabetes Paternal Grandmother    Hypertension Paternal Grandmother     Diabetes Paternal Grandfather    Hypertension Paternal Grandfather     Social History:  reports that she has been smoking cigarettes. She started smoking about 25 years ago. She has a 6.4 pack-year smoking history. She has never used smokeless tobacco. She reports that she does not currently use alcohol. She reports current drug use. Frequency: 3.00 times per week. Drug: Marijuana.  Allergies:  Allergies  Allergen Reactions   Dialyvite 800 [Nephro-Vite] Nausea And Vomiting   Results for orders placed or performed during the hospital encounter of 08/24/24 (from the past 48 hours)  Comprehensive metabolic panel     Status: Abnormal   Collection Time: 08/24/24 10:40 PM  Result Value Ref Range   Sodium 142 135 - 145 mmol/L   Potassium 5.4 (H) 3.5 - 5.1 mmol/L   Chloride 101 98 - 111 mmol/L   CO2 21 (L) 22 - 32 mmol/L   Glucose, Bld 86 70 - 99 mg/dL    Comment: Glucose reference range applies only to samples taken after fasting for at least 8 hours.   BUN 64 (H) 6 - 20 mg/dL   Creatinine, Ser 83.70 (H) 0.44 - 1.00 mg/dL   Calcium  6.9 (L) 8.9 - 10.3 mg/dL   Total Protein 6.7 6.5 - 8.1 g/dL   Albumin  3.2 (L) 3.5 - 5.0 g/dL   AST 26 15 - 41 U/L   ALT 14 0 - 44 U/L   Alkaline Phosphatase 161 (H) 38 - 126 U/L   Total Bilirubin 0.7 0.0 - 1.2 mg/dL   GFR, Estimated 3 (L) >60 mL/min    Comment: (NOTE) Calculated using the CKD-EPI Creatinine Equation (2021)    Anion gap 20 (H) 5 - 15    Comment: Performed at [facility], [address] [place], [place] [address]  CBC with Differential     Status: Abnormal   Collection Time: 08/24/24 10:40 PM  Result Value Ref Range   WBC 5.2 4.0 - 10.5 K/uL   RBC 2.77 (L) 3.87 - 5.11 MIL/uL   Hemoglobin 8.4 (L) 12.0 - 15.0 g/dL   HCT 73.4 (L) 63.9 - 53.9 %   MCV 95.7 80.0 - 100.0 fL   MCH 30.3 26.0 - 34.0 pg   MCHC 31.7 30.0 - 36.0 g/dL   RDW 84.0 (H) 88.4 - 84.4 %   Platelets 87 (L) 150 - 400 K/uL    Comment: SPECIMEN CHECKED FOR CLOTS PLATELET  COUNT CONFIRMED BY SMEAR REPEATED TO VERIFY Immature Platelet Fraction may be clinically indicated, consider ordering this additional test [id number]    nRBC 0.0 0.0 - 0.2 %   Neutrophils Relative % 76 %   Neutro Abs 4.0 1.7 - 7.7 K/uL   Lymphocytes Relative 9 %   Lymphs Abs 0.5 (L) 0.7 - 4.0 K/uL   Monocytes Relative 10 %   Monocytes Absolute 0.5 0.1 - 1.0  K/uL   Eosinophils Relative 3 %   Eosinophils Absolute 0.2 0.0 - 0.5 K/uL   Basophils Relative 1 %   Basophils Absolute 0.0 0.0 - 0.1 K/uL   Immature Granulocytes 1 %   Abs Immature Granulocytes 0.03 0.00 - 0.07 K/uL    Comment: Performed at [facility], [address], [place], [place] [address]  Brain natriuretic peptide     Status: Abnormal   Collection Time: 08/24/24 10:40 PM  Result Value Ref Range   B Natriuretic Peptide 3,189.0 (H) 0.0 - 100.0 pg/mL    Comment: Performed at [facility], [address] [place], [place] [address]  Troponin I (High Sensitivity)     Status: Abnormal   Collection Time: 08/24/24 10:40 PM  Result Value Ref Range   Troponin I (High Sensitivity) 50 (H) <18 ng/L    Comment: (NOTE) Elevated high sensitivity troponin I (hsTnI) values and significant  changes across serial measurements may suggest ACS but many other  chronic and acute conditions are known to elevate hsTnI results.  Refer to the Links section for chest pain algorithms and additional  guidance. Performed at [facility], [address] [place], [place] [address]   Troponin I (High Sensitivity)     Status: Abnormal   Collection Time: 08/25/24 12:48 AM  Result Value Ref Range   Troponin I (High Sensitivity) 51 (H) <18 ng/L    Comment: (NOTE) Elevated high sensitivity troponin I (hsTnI) values and significant  changes across serial measurements may suggest ACS but many other  chronic and acute conditions are known to elevate hsTnI results.  Refer to the Links section for chest pain algorithms and additional   guidance. Performed at [facility], [address] [place], [place] [address]   Basic metabolic panel with GFR     Status: Abnormal   Collection Time: 08/25/24  3:40 AM  Result Value Ref Range   Sodium 141 135 - 145 mmol/L   Potassium 5.1 3.5 - 5.1 mmol/L   Chloride 100 98 - 111 mmol/L   CO2 22 22 - 32 mmol/L   Glucose, Bld 99 70 - 99 mg/dL    Comment: Glucose reference range applies only to samples taken after fasting for at least 8 hours.   BUN 63 (H) 6 - 20 mg/dL   Creatinine, Ser 83.57 (H) 0.44 - 1.00 mg/dL   Calcium  6.8 (L) 8.9 - 10.3 mg/dL   GFR, Estimated 3 (L) >60 mL/min    Comment: (NOTE) Calculated using the CKD-EPI Creatinine Equation (2021)    Anion gap 19 (H) 5 - 15    Comment: Performed at [facility], [address], [place], [place] [address]  CBC     Status: Abnormal   Collection Time: 08/25/24  3:40 AM  Result Value Ref Range   WBC 4.8 4.0 - 10.5 K/uL   RBC 2.54 (L) 3.87 - 5.11 MIL/uL   Hemoglobin 7.4 (L) 12.0 - 15.0 g/dL   HCT 75.4 (L) 63.9 - 53.9 %   MCV 96.5 80.0 - 100.0 fL   MCH 29.1 26.0 - 34.0 pg   MCHC 30.2 30.0 - 36.0 g/dL   RDW 84.0 (H) 88.4 - 84.4 %   Platelets 79 (L) 150 - 400 K/uL    Comment: SPECIMEN CHECKED FOR CLOTS PLATELET COUNT CONFIRMED BY SMEAR REPEATED TO VERIFY Immature Platelet Fraction may be clinically indicated, consider ordering this additional test [id number]    nRBC 0.0 0.0 - 0.2 %    Comment: Performed  at [facility], [address] [place], [place] [address]  Magnesium      Status: None   Collection Time: 08/25/24  3:40 AM  Result Value Ref Range   Magnesium  2.0 1.7 - 2.4 mg/dL    Comment: Performed at [facility], [address] [place], [place] [address]  Phosphorus     Status: Abnormal   Collection Time: 08/25/24  3:40 AM  Result Value Ref Range   Phosphorus 7.9 (H) 2.5 - 4.6 mg/dL    Comment: Performed at [facility], [address], [place], [place] [address]  Protime-INR     Status: Abnormal   Collection  Time: 08/25/24  3:40 AM  Result Value Ref Range   Prothrombin Time 16.2 (H) 11.4 - 15.2 seconds   INR 1.2 0.8 - 1.2    Comment: (NOTE) INR goal varies based on device and disease states. Performed at [facility], [address], [place], [place] [address]     DG Chest Port 1 View Result Date: 08/24/2024 CLINICAL DATA:  Edema for 3 days. Lupus flare up. Headache and nausea. Chest and rib pain. EXAM: PORTABLE CHEST 1 VIEW COMPARISON:  05/31/2024 FINDINGS: Left central venous catheter with tip over the cavoatrial junction region. No pneumothorax. Cardiac enlargement. Shallow inspiration with atelectasis or fibrosis in the lung bases. No airspace disease or consolidation in the lungs. No pneumothorax. Mediastinal contours appear intact. IMPRESSION: Shallow inspiration with atelectasis in the lung bases. Cardiac enlargement. No focal consolidation. Electronically Signed   By: Elsie Gravely M.D.   On: 08/24/2024 22:54    ROS: No SOB, CP, HA, N/V  Blood pressure (!) 179/104, pulse 67, temperature 98.5 F (36.9 C), temperature source Oral, resp. rate 19, height 5' 7 (1.702 m), weight 83.3 kg, last menstrual period 04/07/2020, SpO2 90%. General appearance: Conversational, falling asleep Resp: BLBS Cardio: RRR Extremities: edema BL LE L-TDC L-AVF  Assessment/Plan: 1 Volume overload: Significantly overloaded, per OP HD nurse.  No SOB today. Plan HD as below 2 ESRD/Hyperkalemia: Plan HD 3 Hypertension: Optimize volume status with HD 4. Anemia of ESRD: Check iron  stores.  Can resume ESA. Transfuse Hgb <7.  5. Metabolic Bone Disease: Cont outpatient regimen    Shuntel Fishburn M Alayne Estrella 08/25/2024, 8:39 AM

## 2024-08-25 NOTE — Care Management Obs Status (Signed)
 MEDICARE OBSERVATION STATUS NOTIFICATION   Patient Details  Name: Cathy [name] MRN: [id number] Date of Birth: June 06, 1981   Medicare Observation Status Notification Given:  Yes    Lucie Lunger, LCSWA 08/25/2024, 8:51 AM

## 2024-08-26 ENCOUNTER — Observation Stay (HOSPITAL_COMMUNITY)

## 2024-08-26 ENCOUNTER — Encounter (HOSPITAL_COMMUNITY): Payer: Self-pay | Admitting: Internal Medicine

## 2024-08-26 ENCOUNTER — Telehealth: Payer: Self-pay | Admitting: Family Medicine

## 2024-08-26 DIAGNOSIS — G629 Polyneuropathy, unspecified: Secondary | ICD-10-CM | POA: Diagnosis not present

## 2024-08-26 DIAGNOSIS — N186 End stage renal disease: Secondary | ICD-10-CM | POA: Diagnosis not present

## 2024-08-26 DIAGNOSIS — R188 Other ascites: Secondary | ICD-10-CM | POA: Diagnosis not present

## 2024-08-26 DIAGNOSIS — E8770 Fluid overload, unspecified: Secondary | ICD-10-CM

## 2024-08-26 DIAGNOSIS — Z992 Dependence on renal dialysis: Secondary | ICD-10-CM | POA: Diagnosis not present

## 2024-08-26 DIAGNOSIS — E8779 Other fluid overload: Secondary | ICD-10-CM | POA: Diagnosis not present

## 2024-08-26 LAB — BODY FLUID CELL COUNT WITH DIFFERENTIAL
Eos, Fluid: 0 %
Lymphs, Fluid: 5 %
Monocyte-Macrophage-Serous Fluid: 93 % — ABNORMAL HIGH (ref 50–90)
Neutrophil Count, Fluid: 2 % (ref 0–25)
Total Nucleated Cell Count, Fluid: 133 uL (ref 0–1000)

## 2024-08-26 LAB — ALBUMIN, PLEURAL OR PERITONEAL FLUID: Albumin, Fluid: 1.7 g/dL

## 2024-08-26 LAB — PROTEIN, PLEURAL OR PERITONEAL FLUID: Total protein, fluid: 3.2 g/dL

## 2024-08-26 LAB — GRAM STAIN

## 2024-08-26 LAB — HEPATITIS B SURFACE ANTIBODY, QUANTITATIVE: Hep B S AB Quant (Post): 30.9 m[IU]/mL

## 2024-08-26 LAB — ANTI-DNA ANTIBODY, DOUBLE-STRANDED: ds DNA Ab: 2 [IU]/mL (ref 0–9)

## 2024-08-26 MED ORDER — LEVETIRACETAM 250 MG PO TABS: ORAL_TABLET | ORAL | 0 refills | Status: DC

## 2024-08-26 MED ORDER — OXYCODONE HCL 5 MG PO TABS: 5.0000 mg | ORAL_TABLET | Freq: Three times a day (TID) | ORAL | 0 refills | Status: DC | PRN

## 2024-08-26 MED ORDER — HEPARIN SODIUM (PORCINE) 1000 UNIT/ML DIALYSIS: 1000.0000 [IU] | INTRAMUSCULAR | Status: DC | PRN

## 2024-08-26 MED ORDER — LIDOCAINE-PRILOCAINE 2.5-2.5 % EX CREA: 1.0000 | TOPICAL_CREAM | CUTANEOUS | Status: DC | PRN

## 2024-08-26 MED ORDER — LIDOCAINE HCL (PF) 1 % IJ SOLN: 5.0000 mL | INTRAMUSCULAR | Status: DC | PRN

## 2024-08-26 MED ORDER — PENTAFLUOROPROP-TETRAFLUOROETH EX AERO: 1.0000 | INHALATION_SPRAY | CUTANEOUS | Status: DC | PRN

## 2024-08-26 MED ORDER — BUSPIRONE HCL 7.5 MG PO TABS: 7.5000 mg | ORAL_TABLET | Freq: Two times a day (BID) | ORAL | Status: DC

## 2024-08-26 MED ORDER — ALBUMIN HUMAN 25 % IV SOLN
50.0000 g | Freq: Once | INTRAVENOUS | Status: AC
  Administered 2024-08-26: 50 g via INTRAVENOUS
  Filled 2024-08-26: qty 200

## 2024-08-26 MED ORDER — ALTEPLASE 2 MG IJ SOLR: 2.0000 mg | Freq: Once | INTRAMUSCULAR | Status: DC | PRN

## 2024-08-26 MED ORDER — ANTICOAGULANT SODIUM CITRATE 4% (200MG/5ML) IV SOLN: 5.0000 mL | Status: DC | PRN

## 2024-08-26 MED ORDER — LIDOCAINE HCL (PF) 2 % IJ SOLN
10.0000 mL | Freq: Once | INTRAMUSCULAR | Status: AC
Start: 2024-08-26 — End: 2024-08-26
  Administered 2024-08-26: 10 mL via INTRADERMAL
  Filled 2024-08-26: qty 10

## 2024-08-26 NOTE — Telephone Encounter (Unsigned)
 Copied from CRM [id number]. Topic: Clinical - Medication Question >> Aug 26, 2024  2:54 PM Donee H wrote: Reason for CRM: Alex with [facility] pharmacy called requesting for all the patient's active medications to be transferred to pharmacy and update on medication list. He stated sent a previous request on 08-20-2024 but has not gotten anything as of yet. It can either be sent via e-script or by fax. He would like someone to follow up with him on request or when it has been sent.  Alex direct line is [phone]  Pharmacy information  [facility] [place], [place] - [address] [place] [address] Phone: [phone] Fax: [phone] Hours: Not open 24 hours

## 2024-08-26 NOTE — Plan of Care (Signed)

## 2024-08-26 NOTE — Progress Notes (Addendum)
 Left floor for paracentesis at approx 1130  1217 arrived back to room 319 from paracentesis

## 2024-08-26 NOTE — Progress Notes (Signed)
 Contacted by [facility]. They mentioned that they have made this pt 3-4 appts to get her left chest CVC removed, due to infection concerns for awhile now. pt did not show to any of these appts and is not welcomed back to [facility] due to these no-shows. They are wondering if at all possible, could it be removed here during this admission.   Nephrologist notified at this time.   Will continue to assist as needed.   Estee Yohe Dialysis Navigator [phone]

## 2024-08-26 NOTE — Progress Notes (Signed)
 Admit: 08/24/2024 LOS: 0   16F PMH ESKD on HD MWF, prior PE, NASH, systolic/diastolic HF, HTN, SLE. Presents from HD with SOB, abd distension, diffuse pain. Receiving partial HD treatments.   Subjective:  Nephrology navigator confirmed she is now on a TTS schedule, which hopefully will benefit her prior non adherence.    No events reported overnight.  Seen in bed.  On room air.  Overall doing well.  States she is okay going to HD tomorrow. Spoke to hospitalist.  Planning paracentesis today, then discharge.  09/25 0701 - 09/26 0700 In: 490 [P.O.:480; I.V.:10] Out: -   Filed Weights   08/24/24 2146 08/25/24 0801 08/26/24 0500  Weight: 66.2 kg 83.3 kg 84.5 kg    Scheduled Meds:  amLODipine   10 mg Oral Daily   aspirin   81 mg Oral Daily   atorvastatin   40 mg Oral Daily   carvedilol   12.5 mg Oral BID WC   Chlorhexidine  Gluconate Cloth  6 each Topical Daily   Chlorhexidine  Gluconate Cloth  6 each Topical Q0600   cloNIDine   0.3 mg Oral BID   gabapentin   100 mg Oral Q T,Th,Sa-HD   levETIRAcetam   500 mg Oral Daily   And   levETIRAcetam   250 mg Oral Q T,Th,S,Su-1800   losartan   100 mg Oral QHS   sodium chloride  flush  10-40 mL Intracatheter Q12H   sodium chloride  flush  3 mL Intravenous Q12H   Continuous Infusions: PRN Meds:.acetaminophen , albuterol , melatonin, ondansetron  (ZOFRAN ) IV, oxyCODONE  **OR** oxyCODONE , polyethylene glycol, sodium chloride  flush  Current Labs: reviewed No new labs  Outpt HD Orders Center:  [facility] on MWF->TTS EDW 69 kg HD Bath 1K/2.5Ca, Na 138 Time 4:00 Heparin  500 bolus then 500 units/hr. Access LUE AVF BFR 400 DFR 600, 15g 1in, Nipro 17H, Temp 37  Mircera 90mcg q2w Venofer  50mg  qTuesday  Physical Exam:  Blood pressure (!) 178/100, pulse 63, temperature 98.6 F (37 C), temperature source Oral, resp. rate 18, height 5' 7 (1.702 m), weight 84.5 kg, last menstrual period 04/07/2020, SpO2 95%.  General appearance: Conversational, NAD Resp:  BLBS Cardio: RRR Extremities: edema BL LE L-TDC L-AVF  Assessment/Plan Volume overload: On room air.  Plan outpatient HD tomorrow ESRD/Hyperkalemia: Plan HD tomorrow.  Hyperkalemia resolved. Hypertension: Optimize volume status with HD Anemia of ESRD: Check iron  stores.  Can resume ESA. Transfuse Hgb <7.  Metabolic Bone Disease: Cont outpatient regimen   Dr. Evalene Lanes, [facility] 08/26/2024, 9:52 AM  Recent Labs  Lab 08/24/24 2240 08/25/24 0340  NA 142 141  K 5.4* 5.1  CL 101 100  CO2 21* 22  GLUCOSE 86 99  BUN 64* 63*  CREATININE 16.29* 16.42*  CALCIUM  6.9* 6.8*  PHOS  --  7.9*   Recent Labs  Lab 08/24/24 2240 08/25/24 0340  WBC 5.2 4.8  NEUTROABS 4.0  --   HGB 8.4* 7.4*  HCT 26.5* 24.5*  MCV 95.7 96.5  PLT 87* 79*

## 2024-08-26 NOTE — Telephone Encounter (Signed)
 Patient currently admitted to hospital, unable to send in any prescriptions

## 2024-08-26 NOTE — Discharge Summary (Signed)
 Physician Discharge Summary   Patient: Cathy [name] MRN: [id number] DOB: [birth year]  Admit date:     08/24/2024  Discharge date: 08/26/24  Discharge Physician: Eric Nunnery   PCP: Terry Wilhelmena Lloyd Hilario, FNP   Recommendations at discharge:  Repeat CBC to follow hemoglobin trend/stability Make sure patient follow-up with gastroenterology service for further management of underlying NASH cirrhosis with ascites. Reassess blood pressure and adjust antihypertensive agents as needed.  Discharge Diagnoses: Active Problems:   End-stage renal disease on hemodialysis (HCC)   Volume overload   Tense ascites   Neuropathy   Brief Hospital admission narrative: As per H&P written by Dr. Keturah on 08/25/2024 Cathy [name] is a 43 y.o. female with hx of ESRD on HD (MWF), prior PE, NASH, systolic and diastolic CHF, hypertension, and SLE, who presented from dialysis with SOB, abd distension and diffuse pain. Reports had been receiving partial HD treatments as she needed to leave early to pick up daughter from school, unclear when last had a full session. She says they just fixed her schedule so she will be able to have full sessions from now on. Abd has been distended for months, and she was unsure when last had paracentesis (appears last in 5/'25). Otherwise she reports pain mainly in her lower extremities, described as electric pain even to light touch extends all throughout lower extremities. She otherwise has joint pain in her ankles, feet > hands, and mid back. She attributes all these to lupus type pain flaring back up.  Otherwise she reports persistent SOB, and sharp central chest pain which is nonexertional, worse with left lateral decubitus positioning.   Assessment and Plan: 1-volume overload/ESRD/hyperkalemia/anion gap metabolic acidosis -Improve after hemodialysis treatment provided - Patient has had adjusted schedule with anticipated next treatment on 08/27/2024 - She will be now on  Tuesdays-Thursdays and Saturdays - Continue dry weight challenging for further volume stabilization - Discussed with patient about the importance of low-sodium diet.  2-NASH cirrhosis with tense ascites - Paracentesis provided throughout this hospitalization with 5.9 L removed. - Albumin  provided - Further volume management to be addressed as an outpatient with routine paracentesis as scheduled by GI service.  3-elevated troponin - No frank chest pain appreciated on exam - Most likely in the setting of volume overload with possible some component of pericarditis - Flat troponin around 50>>51 - Overall improved after hemodialysis treatment. - Continue volume management.  4-borderline severe hypocalcemia - Patient treated with 1 g of IV calcium  gluconate - Further electrolytes to be addressed and managed by nephrology service.  5-lower extremity pains - Most likely associated with neuropathy - Gabapentin  trial provided.  6-chronic diastolic heart failure - Most recent echo in July 2025 with ejection fraction of 60 to 65%; there is severe asymmetric hypertrophy of the lateral segment with indeterminate diastolic parameters. - Continue volume management per dialysis - Low-sodium diet discussed with patient.  7-hypertension - Continue current antihypertensive agents.  8-systemic lupus - Continue patient follow-up with rheumatology service. - Continue as needed analgesics  9-history of seizure disorder - Continue Keppra . -No seizure activity appreciated.  10-prior history of stroke - No new neurologic deficit appreciated - Continue Refacto modification - Continue aspirin  and statin.  Consultants: Nephrology service; IR Procedures performed: Paracentesis (08/26/2024); hemodialysis 08/25/2024.  Please refer below for x-ray reports. Disposition: Home Diet recommendation: Renal diet with fluid restrictions.  DISCHARGE MEDICATION: Allergies as of 08/26/2024       Reactions    Dialyvite 800 [nephro-vite] Nausea And  Vomiting        Medication List     TAKE these medications    acetaminophen  500 MG tablet Commonly known as: TYLENOL  Take 1,000 mg by mouth every 6 (six) hours as needed for mild pain (pain score 1-3).   albuterol  108 (90 Base) MCG/ACT inhaler Commonly known as: VENTOLIN  HFA Inhale 2 puffs into the lungs every 6 (six) hours as needed for wheezing or shortness of breath.   amLODipine  10 MG tablet Commonly known as: NORVASC  Take 1 tablet (10 mg total) by mouth in the morning.   aspirin  81 MG chewable tablet Chew 1 tablet (81 mg total) by mouth daily. What changed: when to take this   atorvastatin  40 MG tablet Commonly known as: LIPITOR Take 1 tablet (40 mg total) by mouth daily.   busPIRone  7.5 MG tablet Commonly known as: BUSPAR  Take 1 tablet (7.5 mg total) by mouth 2 (two) times daily.   camphor-menthol  lotion Commonly known as: SARNA Apply topically as needed for itching. What changed: how much to take   carvedilol  12.5 MG tablet Commonly known as: COREG  Take 1 tablet (12.5 mg total) by mouth 2 (two) times daily with a meal.   cloNIDine  0.3 MG tablet Commonly known as: CATAPRES  Take 1 tablet (0.3 mg total) by mouth 2 (two) times daily.   gabapentin  300 MG capsule Commonly known as: NEURONTIN  Take 1 capsule (300 mg total) by mouth at bedtime.   hydrALAZINE  100 MG tablet Commonly known as: APRESOLINE  Take 1 tablet (100 mg total) by mouth 3 (three) times daily.   hydrOXYzine  25 MG tablet Commonly known as: ATARAX  Take 25 mg by mouth every 12 (twelve) hours as needed for anxiety or itching.   levETIRAcetam  250 MG tablet Commonly known as: KEPPRA  Take 2 tablets (500 mg total) by mouth daily AND 1 tablet (250 mg total) every Monday, Wednesday, and Friday with hemodialysis.   lidocaine  2 % solution Commonly known as: XYLOCAINE  Use as directed 15 mLs in the mouth or throat as needed. What changed: reasons to take this    losartan  100 MG tablet Commonly known as: COZAAR  Take 100 mg by mouth daily.   Omron 3 Series BP Monitor Devi Use as directed 2 times a day   oxyCODONE  5 MG immediate release tablet Commonly known as: Oxy IR/ROXICODONE  Take 1 tablet (5 mg total) by mouth every 8 (eight) hours as needed for severe pain (pain score 7-10).   pantoprazole  40 MG tablet Commonly known as: PROTONIX  TAKE 1 TABLET BY MOUTH DAILY BEFORE BREAKFAST        Discharge Exam: Filed Weights   08/24/24 2146 08/25/24 0801 08/26/24 0500  Weight: 66.2 kg 83.3 kg 84.5 kg   General exam: Alert, awake, oriented x 3; in no acute distress. Respiratory system: Improved air movement bilaterally; no using accessory muscle.  Good saturation on room air. Cardiovascular system:RRR. No rubs or gallops. Gastrointestinal system: Abdomen is mildly distended and demonstrating significant improvement in ascites; positive bowel sounds.  No guarding or significant tenderness. Central nervous system:  No focal neurological deficits. Extremities: No cyanosis or clubbing.  2-3+ edema bilaterally. Skin: No petechiae. Psychiatry: Judgement and insight appear normal. Mood & affect appropriate.    Condition at discharge: Stable and improved.  The results of significant diagnostics from this hospitalization (including imaging, microbiology, ancillary and laboratory) are listed below for reference.   Imaging Studies: US  Paracentesis Result Date: 08/26/2024 INDICATION: Patient history of end-stage renal disease on hemodialysis, admitted with fluid  overload and recurrent ascites. Request for diagnostic and therapeutic paracentesis. EXAM: ULTRASOUND GUIDED DIAGNOSTIC AND THERAPEUTIC PARACENTESIS MEDICATIONS: 8 mL 1% lidocaine  COMPLICATIONS: None immediate. PROCEDURE: Informed written consent was obtained from the patient after a discussion of the risks, benefits and alternatives to treatment. A timeout was performed prior to the initiation of  the procedure. Initial ultrasound scanning demonstrates a large amount of ascites within the left lower abdominal quadrant. The left lower abdomen was prepped and draped in the usual sterile fashion. 1% lidocaine  was used for local anesthesia. Following this, a 19 gauge, 7-cm, Yueh catheter was introduced. An ultrasound image was saved for documentation purposes. The paracentesis was performed. The catheter was removed and a dressing was applied. The patient tolerated the procedure well without immediate post procedural complication. Patient received post-procedure intravenous albumin  on the floor postprocedure; see nursing notes for details. FINDINGS: A total of approximately 5.6 L of clear, dark yellow fluid was removed. Samples were sent to the laboratory as requested by the clinical team. IMPRESSION: Successful ultrasound-guided paracentesis yielding 5.6 liters of peritoneal fluid. Performed by Clotilda Hesselbach, PA-C Electronically Signed   By: Ester Sides M.D.   On: 08/26/2024 14:23   DG Chest Port 1 View Result Date: 08/24/2024 CLINICAL DATA:  Edema for 3 days. Lupus flare up. Headache and nausea. Chest and rib pain. EXAM: PORTABLE CHEST 1 VIEW COMPARISON:  05/31/2024 FINDINGS: Left central venous catheter with tip over the cavoatrial junction region. No pneumothorax. Cardiac enlargement. Shallow inspiration with atelectasis or fibrosis in the lung bases. No airspace disease or consolidation in the lungs. No pneumothorax. Mediastinal contours appear intact. IMPRESSION: Shallow inspiration with atelectasis in the lung bases. Cardiac enlargement. No focal consolidation. Electronically Signed   By: Elsie Gravely M.D.   On: 08/24/2024 22:54    Microbiology: Results for orders placed or performed during the hospital encounter of 08/24/24  Gram stain     Status: None   Collection Time: 08/26/24 11:43 AM   Specimen: Peritoneal Washings; Peritoneal Fluid  Result Value Ref Range Status   Specimen  Description PERITONEAL  Final   Special Requests NONE  Final   Gram Stain   Final    CYTOSPIN SMEAR ASCITIC WBC PRESENT, PREDOMINANTLY MONONUCLEAR NO ORGANISMS SEEN Performed at [facility], [address], [place], [place] [address]    Report Status 08/26/2024 FINAL  Final    Labs: CBC: Recent Labs  Lab 08/24/24 2240 08/25/24 0340  WBC 5.2 4.8  NEUTROABS 4.0  --   HGB 8.4* 7.4*  HCT 26.5* 24.5*  MCV 95.7 96.5  PLT 87* 79*   Basic Metabolic Panel: Recent Labs  Lab 08/24/24 2240 08/25/24 0340  NA 142 141  K 5.4* 5.1  CL 101 100  CO2 21* 22  GLUCOSE 86 99  BUN 64* 63*  CREATININE 16.29* 16.42*  CALCIUM  6.9* 6.8*  MG  --  2.0  PHOS  --  7.9*   Liver Function Tests: Recent Labs  Lab 08/24/24 2240  AST 26  ALT 14  ALKPHOS 161*  BILITOT 0.7  PROT 6.7  ALBUMIN  3.2*   CBG: No results for input(s): GLUCAP in the last 168 hours.  Discharge time spent:  35 minutes.  Signed: Eric Nunnery, MD [facility] 08/26/2024

## 2024-08-26 NOTE — Telephone Encounter (Signed)
  Copied from CRM [id number]. Topic: Clinical - Medication Question >> Aug 26, 2024  2:54 PM Donee H wrote: Reason for CRM: Alex with [facility] pharmacy called requesting for all the patient's active medications to be transferred to pharmacy and update on medication list. He stated sent a previous request on 08-20-2024 but has not gotten anything as of yet. It can either be sent via e-script or by fax. He would like someone to follow up with him on request or when it has been sent.   Alex direct line is [phone]   Pharmacy information   [facility] - [place], [place] - [address] [place] [address] Phone: [phone] Fax: [phone] Hours: Not open 24 hours

## 2024-08-26 NOTE — Procedures (Signed)
 PROCEDURE SUMMARY:  Successful image-guided paracentesis from the left lower abdomen.  Yielded 5.6 liters of clear dark yellow fluid.  No immediate complications.  EBL < 1 mL Patient tolerated well.   Specimen was sent for labs.  Please see imaging section of Epic for full dictation.  Clotilda DELENA Hesselbach PA-C 08/26/2024 11:58 AM

## 2024-08-26 NOTE — Progress Notes (Signed)
 Pt had a paracentesis. Removed 5.9 L of peritoneal fluid. Tolerated well. VSS. No acute distress. Pt was transported back to her room.  Dr. Ricky made aware of amount. No orders given at this time

## 2024-08-28 ENCOUNTER — Inpatient Hospital Stay (HOSPITAL_COMMUNITY)
Admission: EM | Admit: 2024-08-28 | Discharge: 2024-09-01 | DRG: 871 | Disposition: A | Discharge status: Home / Self Care | Attending: Family Medicine | Admitting: Family Medicine

## 2024-08-28 ENCOUNTER — Encounter (HOSPITAL_COMMUNITY): Payer: Self-pay | Admitting: Emergency Medicine

## 2024-08-28 ENCOUNTER — Emergency Department (HOSPITAL_COMMUNITY)

## 2024-08-28 ENCOUNTER — Other Ambulatory Visit: Payer: Self-pay

## 2024-08-28 DIAGNOSIS — F339 Major depressive disorder, recurrent, unspecified: Secondary | ICD-10-CM

## 2024-08-28 DIAGNOSIS — A419 Sepsis, unspecified organism: Principal | ICD-10-CM | POA: Diagnosis present

## 2024-08-28 DIAGNOSIS — F329 Major depressive disorder, single episode, unspecified: Secondary | ICD-10-CM | POA: Diagnosis present

## 2024-08-28 DIAGNOSIS — Z1152 Encounter for screening for COVID-19: Secondary | ICD-10-CM

## 2024-08-28 DIAGNOSIS — E162 Hypoglycemia, unspecified: Secondary | ICD-10-CM | POA: Diagnosis present

## 2024-08-28 DIAGNOSIS — E871 Hypo-osmolality and hyponatremia: Secondary | ICD-10-CM | POA: Diagnosis present

## 2024-08-28 DIAGNOSIS — F419 Anxiety disorder, unspecified: Secondary | ICD-10-CM | POA: Diagnosis present

## 2024-08-28 DIAGNOSIS — I120 Hypertensive chronic kidney disease with stage 5 chronic kidney disease or end stage renal disease: Secondary | ICD-10-CM | POA: Diagnosis not present

## 2024-08-28 DIAGNOSIS — B9689 Other specified bacterial agents as the cause of diseases classified elsewhere: Secondary | ICD-10-CM | POA: Diagnosis present

## 2024-08-28 DIAGNOSIS — I5022 Chronic systolic (congestive) heart failure: Secondary | ICD-10-CM | POA: Diagnosis not present

## 2024-08-28 DIAGNOSIS — D696 Thrombocytopenia, unspecified: Secondary | ICD-10-CM | POA: Diagnosis present

## 2024-08-28 DIAGNOSIS — N186 End stage renal disease: Secondary | ICD-10-CM | POA: Diagnosis not present

## 2024-08-28 DIAGNOSIS — G9341 Metabolic encephalopathy: Secondary | ICD-10-CM | POA: Diagnosis present

## 2024-08-28 DIAGNOSIS — B9561 Methicillin susceptible Staphylococcus aureus infection as the cause of diseases classified elsewhere: Secondary | ICD-10-CM | POA: Diagnosis not present

## 2024-08-28 DIAGNOSIS — K7460 Unspecified cirrhosis of liver: Secondary | ICD-10-CM | POA: Diagnosis not present

## 2024-08-28 DIAGNOSIS — I5020 Unspecified systolic (congestive) heart failure: Secondary | ICD-10-CM | POA: Diagnosis not present

## 2024-08-28 DIAGNOSIS — I2699 Other pulmonary embolism without acute cor pulmonale: Secondary | ICD-10-CM | POA: Diagnosis not present

## 2024-08-28 DIAGNOSIS — Z7982 Long term (current) use of aspirin: Secondary | ICD-10-CM

## 2024-08-28 DIAGNOSIS — E872 Acidosis, unspecified: Secondary | ICD-10-CM | POA: Diagnosis present

## 2024-08-28 DIAGNOSIS — Z79899 Other long term (current) drug therapy: Secondary | ICD-10-CM

## 2024-08-28 DIAGNOSIS — R404 Transient alteration of awareness: Secondary | ICD-10-CM | POA: Diagnosis not present

## 2024-08-28 DIAGNOSIS — A4101 Sepsis due to Methicillin susceptible Staphylococcus aureus: Principal | ICD-10-CM | POA: Diagnosis present

## 2024-08-28 DIAGNOSIS — D631 Anemia in chronic kidney disease: Secondary | ICD-10-CM | POA: Diagnosis not present

## 2024-08-28 DIAGNOSIS — F3130 Bipolar disorder, current episode depressed, mild or moderate severity, unspecified: Secondary | ICD-10-CM | POA: Diagnosis present

## 2024-08-28 DIAGNOSIS — I132 Hypertensive heart and chronic kidney disease with heart failure and with stage 5 chronic kidney disease, or end stage renal disease: Secondary | ICD-10-CM | POA: Diagnosis present

## 2024-08-28 DIAGNOSIS — E11649 Type 2 diabetes mellitus with hypoglycemia without coma: Secondary | ICD-10-CM | POA: Diagnosis not present

## 2024-08-28 DIAGNOSIS — J45909 Unspecified asthma, uncomplicated: Secondary | ICD-10-CM | POA: Diagnosis present

## 2024-08-28 DIAGNOSIS — R188 Other ascites: Secondary | ICD-10-CM | POA: Diagnosis not present

## 2024-08-28 DIAGNOSIS — E875 Hyperkalemia: Secondary | ICD-10-CM | POA: Diagnosis not present

## 2024-08-28 DIAGNOSIS — R569 Unspecified convulsions: Secondary | ICD-10-CM | POA: Diagnosis present

## 2024-08-28 DIAGNOSIS — R9431 Abnormal electrocardiogram [ECG] [EKG]: Secondary | ICD-10-CM | POA: Diagnosis not present

## 2024-08-28 DIAGNOSIS — F17210 Nicotine dependence, cigarettes, uncomplicated: Secondary | ICD-10-CM | POA: Diagnosis present

## 2024-08-28 DIAGNOSIS — R6520 Severe sepsis without septic shock: Secondary | ICD-10-CM | POA: Diagnosis present

## 2024-08-28 DIAGNOSIS — Z4803 Encounter for change or removal of drains: Secondary | ICD-10-CM

## 2024-08-28 DIAGNOSIS — I351 Nonrheumatic aortic (valve) insufficiency: Secondary | ICD-10-CM | POA: Diagnosis not present

## 2024-08-28 DIAGNOSIS — I151 Hypertension secondary to other renal disorders: Secondary | ICD-10-CM | POA: Diagnosis not present

## 2024-08-28 DIAGNOSIS — M329 Systemic lupus erythematosus, unspecified: Secondary | ICD-10-CM | POA: Diagnosis not present

## 2024-08-28 DIAGNOSIS — Z833 Family history of diabetes mellitus: Secondary | ICD-10-CM

## 2024-08-28 DIAGNOSIS — R4182 Altered mental status, unspecified: Secondary | ICD-10-CM | POA: Diagnosis not present

## 2024-08-28 DIAGNOSIS — J90 Pleural effusion, not elsewhere classified: Secondary | ICD-10-CM | POA: Diagnosis not present

## 2024-08-28 DIAGNOSIS — I10 Essential (primary) hypertension: Secondary | ICD-10-CM | POA: Diagnosis present

## 2024-08-28 DIAGNOSIS — K7581 Nonalcoholic steatohepatitis (NASH): Secondary | ICD-10-CM | POA: Diagnosis not present

## 2024-08-28 DIAGNOSIS — Z87441 Personal history of nephrotic syndrome: Secondary | ICD-10-CM

## 2024-08-28 DIAGNOSIS — Z888 Allergy status to other drugs, medicaments and biological substances status: Secondary | ICD-10-CM

## 2024-08-28 DIAGNOSIS — N2581 Secondary hyperparathyroidism of renal origin: Secondary | ICD-10-CM | POA: Diagnosis not present

## 2024-08-28 DIAGNOSIS — Z992 Dependence on renal dialysis: Secondary | ICD-10-CM | POA: Diagnosis not present

## 2024-08-28 DIAGNOSIS — R7881 Bacteremia: Secondary | ICD-10-CM | POA: Diagnosis not present

## 2024-08-28 DIAGNOSIS — I361 Nonrheumatic tricuspid (valve) insufficiency: Secondary | ICD-10-CM | POA: Diagnosis not present

## 2024-08-28 DIAGNOSIS — Z8249 Family history of ischemic heart disease and other diseases of the circulatory system: Secondary | ICD-10-CM

## 2024-08-28 DIAGNOSIS — M3212 Pericarditis in systemic lupus erythematosus: Secondary | ICD-10-CM | POA: Diagnosis not present

## 2024-08-28 DIAGNOSIS — K652 Spontaneous bacterial peritonitis: Secondary | ICD-10-CM | POA: Diagnosis present

## 2024-08-28 DIAGNOSIS — Z818 Family history of other mental and behavioral disorders: Secondary | ICD-10-CM

## 2024-08-28 DIAGNOSIS — R609 Edema, unspecified: Secondary | ICD-10-CM | POA: Diagnosis not present

## 2024-08-28 DIAGNOSIS — Z8669 Personal history of other diseases of the nervous system and sense organs: Secondary | ICD-10-CM

## 2024-08-28 DIAGNOSIS — R509 Fever, unspecified: Secondary | ICD-10-CM | POA: Diagnosis not present

## 2024-08-28 DIAGNOSIS — Z86711 Personal history of pulmonary embolism: Secondary | ICD-10-CM

## 2024-08-28 LAB — CBG MONITORING, ED
Glucose-Capillary: 35 mg/dL — CL (ref 70–99)
Glucose-Capillary: 36 mg/dL — CL (ref 70–99)
Glucose-Capillary: 95 mg/dL (ref 70–99)

## 2024-08-28 LAB — LACTIC ACID, PLASMA: Lactic Acid, Venous: 1.4 mmol/L (ref 0.5–1.9)

## 2024-08-28 LAB — CBC WITH DIFFERENTIAL/PLATELET
Abs Immature Granulocytes: 0.16 K/uL — ABNORMAL HIGH (ref 0.00–0.07)
Basophils Absolute: 0 K/uL (ref 0.0–0.1)
Basophils Relative: 0 %
Eosinophils Absolute: 0 K/uL (ref 0.0–0.5)
Eosinophils Relative: 0 %
HCT: 28.2 % — ABNORMAL LOW (ref 36.0–46.0)
Hemoglobin: 9 g/dL — ABNORMAL LOW (ref 12.0–15.0)
Immature Granulocytes: 2 %
Lymphocytes Relative: 2 %
Lymphs Abs: 0.2 K/uL — ABNORMAL LOW (ref 0.7–4.0)
MCH: 29.3 pg (ref 26.0–34.0)
MCHC: 31.9 g/dL (ref 30.0–36.0)
MCV: 91.9 fL (ref 80.0–100.0)
Monocytes Absolute: 0.5 K/uL (ref 0.1–1.0)
Monocytes Relative: 5 %
Neutro Abs: 9.8 K/uL — ABNORMAL HIGH (ref 1.7–7.7)
Neutrophils Relative %: 91 %
Platelets: 71 K/uL — ABNORMAL LOW (ref 150–400)
RBC: 3.07 MIL/uL — ABNORMAL LOW (ref 3.87–5.11)
RDW: 16 % — ABNORMAL HIGH (ref 11.5–15.5)
WBC: 10.7 K/uL — ABNORMAL HIGH (ref 4.0–10.5)
nRBC: 0.4 % — ABNORMAL HIGH (ref 0.0–0.2)

## 2024-08-28 LAB — RESP PANEL BY RT-PCR (RSV, FLU A&B, COVID)  RVPGX2
Influenza A by PCR: NEGATIVE
Influenza B by PCR: NEGATIVE
Resp Syncytial Virus by PCR: NEGATIVE
SARS Coronavirus 2 by RT PCR: NEGATIVE

## 2024-08-28 LAB — BLOOD GAS, VENOUS
Acid-base deficit: 6.8 mmol/L — ABNORMAL HIGH (ref 0.0–2.0)
Bicarbonate: 17.7 mmol/L — ABNORMAL LOW (ref 20.0–28.0)
Drawn by: 45378
O2 Saturation: 75.4 %
Patient temperature: 38.4
pCO2, Ven: 34 mmHg — ABNORMAL LOW (ref 44–60)
pH, Ven: 7.33 (ref 7.25–7.43)
pO2, Ven: 55 mmHg — ABNORMAL HIGH (ref 32–45)

## 2024-08-28 LAB — ETHANOL: Alcohol, Ethyl (B): <15 mg/dL (ref <15)

## 2024-08-28 LAB — COMPREHENSIVE METABOLIC PANEL WITH GFR
ALT: 21 U/L (ref 0–44)
AST: 61 U/L — ABNORMAL HIGH (ref 15–41)
Albumin: 2.9 g/dL — ABNORMAL LOW (ref 3.5–5.0)
Alkaline Phosphatase: 180 U/L — ABNORMAL HIGH (ref 38–126)
Anion gap: 23 — ABNORMAL HIGH (ref 5–15)
BUN: 91 mg/dL — ABNORMAL HIGH (ref 6–20)
CO2: 17 mmol/L — ABNORMAL LOW (ref 22–32)
Calcium: 6.4 mg/dL — CL (ref 8.9–10.3)
Chloride: 100 mmol/L (ref 98–111)
Creatinine, Ser: 20.6 mg/dL — ABNORMAL HIGH (ref 0.44–1.00)
GFR, Estimated: 2 mL/min — ABNORMAL LOW (ref >60)
Glucose, Bld: 64 mg/dL — ABNORMAL LOW (ref 70–99)
Potassium: 6.1 mmol/L — ABNORMAL HIGH (ref 3.5–5.1)
Sodium: 140 mmol/L (ref 135–145)
Total Bilirubin: 1.5 mg/dL — ABNORMAL HIGH (ref 0.0–1.2)
Total Protein: 5.6 g/dL — ABNORMAL LOW (ref 6.5–8.1)

## 2024-08-28 LAB — TSH: TSH: 2.303 u[IU]/mL (ref 0.350–4.500)

## 2024-08-28 LAB — LIPASE, BLOOD: Lipase: 25 U/L (ref 11–51)

## 2024-08-28 LAB — AMMONIA: Ammonia: 24 umol/L (ref 9–35)

## 2024-08-28 LAB — PROTIME-INR
INR: 1.4 — ABNORMAL HIGH (ref 0.8–1.2)
Prothrombin Time: 17.4 s — ABNORMAL HIGH (ref 11.4–15.2)

## 2024-08-28 MED ORDER — DEXTROSE 50 % IV SOLN: 25.0000 g | Freq: Once | INTRAVENOUS | Status: AC

## 2024-08-28 MED ORDER — ACETAMINOPHEN 325 MG PO TABS
650.0000 mg | ORAL_TABLET | Freq: Four times a day (QID) | ORAL | Status: DC | PRN
  Administered 2024-08-29 – 2024-08-31 (×7): 650 mg via ORAL
  Filled 2024-08-28 (×6): qty 2

## 2024-08-28 MED ORDER — IOHEXOL 300 MG/ML  SOLN
100.0000 mL | Freq: Once | INTRAMUSCULAR | Status: AC | PRN
  Administered 2024-08-28: 100 mL via INTRAVENOUS

## 2024-08-28 MED ORDER — SODIUM CHLORIDE 0.9 % IV BOLUS
500.0000 mL | Freq: Once | INTRAVENOUS | Status: AC
  Administered 2024-08-28: 500 mL via INTRAVENOUS

## 2024-08-28 MED ORDER — HYDROXYZINE HCL 25 MG PO TABS
25.0000 mg | ORAL_TABLET | Freq: Two times a day (BID) | ORAL | Status: DC | PRN
  Administered 2024-08-31: 25 mg via ORAL
  Filled 2024-08-28: qty 1

## 2024-08-28 MED ORDER — HEPARIN SODIUM (PORCINE) 5000 UNIT/ML IJ SOLN: 5000.0000 [IU] | Freq: Three times a day (TID) | INTRAMUSCULAR | Status: DC

## 2024-08-28 MED ORDER — CEFTRIAXONE SODIUM 2 G IJ SOLR
2.0000 g | Freq: Once | INTRAMUSCULAR | Status: AC
  Administered 2024-08-29: 2 g via INTRAVENOUS
  Filled 2024-08-28: qty 20

## 2024-08-28 MED ORDER — PANTOPRAZOLE SODIUM 40 MG PO TBEC
40.0000 mg | DELAYED_RELEASE_TABLET | Freq: Every day | ORAL | Status: DC
  Administered 2024-08-29 – 2024-09-01 (×4): 40 mg via ORAL
  Filled 2024-08-28 (×4): qty 1

## 2024-08-28 MED ORDER — HYDRALAZINE HCL 50 MG PO TABS
100.0000 mg | ORAL_TABLET | Freq: Three times a day (TID) | ORAL | Status: DC
Start: 2024-08-28 — End: 2024-08-28

## 2024-08-28 MED ORDER — ALBUTEROL SULFATE HFA 108 (90 BASE) MCG/ACT IN AERS
2.0000 | INHALATION_SPRAY | Freq: Four times a day (QID) | RESPIRATORY_TRACT | Status: DC | PRN
Start: 2024-08-28 — End: 2024-08-28

## 2024-08-28 MED ORDER — LEVETIRACETAM 500 MG PO TABS
500.0000 mg | ORAL_TABLET | Freq: Every day | ORAL | Status: DC
  Administered 2024-08-29 – 2024-09-01 (×4): 500 mg via ORAL
  Filled 2024-08-28 (×4): qty 1

## 2024-08-28 MED ORDER — BUSPIRONE HCL 5 MG PO TABS
7.5000 mg | ORAL_TABLET | Freq: Two times a day (BID) | ORAL | Status: DC
  Administered 2024-08-28 – 2024-09-01 (×8): 7.5 mg via ORAL
  Filled 2024-08-28 (×9): qty 2

## 2024-08-28 MED ORDER — AMLODIPINE BESYLATE 5 MG PO TABS
10.0000 mg | ORAL_TABLET | Freq: Every morning | ORAL | Status: DC
Start: 2024-08-29 — End: 2024-08-28

## 2024-08-28 MED ORDER — CLONIDINE HCL 0.2 MG PO TABS
0.3000 mg | ORAL_TABLET | Freq: Two times a day (BID) | ORAL | Status: DC
Start: 2024-08-28 — End: 2024-08-29
  Administered 2024-08-28 – 2024-08-29 (×2): 0.3 mg via ORAL
  Filled 2024-08-28 (×2): qty 1

## 2024-08-28 MED ORDER — LIDOCAINE HCL (PF) 1 % IJ SOLN
5.0000 mL | Freq: Once | INTRAMUSCULAR | Status: AC
Start: 2024-08-28 — End: 2024-08-28
  Administered 2024-08-28: 5 mL
  Filled 2024-08-28: qty 5

## 2024-08-28 MED ORDER — ACETAMINOPHEN 500 MG PO TABS
1000.0000 mg | ORAL_TABLET | Freq: Once | ORAL | Status: AC
  Administered 2024-08-28: 1000 mg via ORAL
  Filled 2024-08-28: qty 2

## 2024-08-28 MED ORDER — DEXTROSE 50 % IV SOLN
INTRAVENOUS | Status: AC
  Administered 2024-08-28: 25 g via INTRAVENOUS
  Filled 2024-08-28: qty 50

## 2024-08-28 MED ORDER — ASPIRIN 81 MG PO CHEW
81.0000 mg | CHEWABLE_TABLET | Freq: Every day | ORAL | Status: DC
  Administered 2024-08-28 – 2024-08-31 (×4): 81 mg via ORAL
  Filled 2024-08-28 (×4): qty 1

## 2024-08-28 MED ORDER — ALBUTEROL SULFATE (2.5 MG/3ML) 0.083% IN NEBU: 2.5000 mg | INHALATION_SOLUTION | Freq: Four times a day (QID) | RESPIRATORY_TRACT | Status: DC | PRN

## 2024-08-28 MED ORDER — CARVEDILOL 12.5 MG PO TABS
12.5000 mg | ORAL_TABLET | Freq: Two times a day (BID) | ORAL | Status: DC
Start: 2024-08-29 — End: 2024-08-28

## 2024-08-28 MED ORDER — LOSARTAN POTASSIUM 25 MG PO TABS
100.0000 mg | ORAL_TABLET | Freq: Every day | ORAL | Status: DC
Start: 2024-08-29 — End: 2024-08-29

## 2024-08-28 MED ORDER — ACETAMINOPHEN 650 MG RE SUPP: 650.0000 mg | Freq: Four times a day (QID) | RECTAL | Status: DC | PRN

## 2024-08-28 MED ORDER — HEPARIN SODIUM (PORCINE) 5000 UNIT/ML IJ SOLN
5000.0000 [IU] | Freq: Three times a day (TID) | INTRAMUSCULAR | Status: DC
  Administered 2024-08-29 – 2024-08-30 (×4): 5000 [IU] via SUBCUTANEOUS
  Filled 2024-08-28 (×4): qty 1

## 2024-08-28 MED ORDER — LEVETIRACETAM 250 MG PO TABS
250.0000 mg | ORAL_TABLET | ORAL | Status: DC
  Filled 2024-08-28: qty 1

## 2024-08-28 MED ORDER — GABAPENTIN 300 MG PO CAPS
300.0000 mg | ORAL_CAPSULE | Freq: Every day | ORAL | Status: DC
  Administered 2024-08-28 – 2024-08-31 (×4): 300 mg via ORAL
  Filled 2024-08-28 (×4): qty 1

## 2024-08-28 MED ORDER — SODIUM ZIRCONIUM CYCLOSILICATE 5 G PO PACK
10.0000 g | PACK | Freq: Once | ORAL | Status: AC
  Administered 2024-08-29: 10 g via ORAL
  Filled 2024-08-28: qty 2

## 2024-08-28 MED ORDER — CHLORHEXIDINE GLUCONATE CLOTH 2 % EX PADS
6.0000 | MEDICATED_PAD | Freq: Every day | CUTANEOUS | Status: DC
  Administered 2024-08-29 – 2024-09-01 (×4): 6 via TOPICAL

## 2024-08-28 NOTE — H&P (Addendum)
 History and Physical    Patient: Cathy [name] FMW:[id number] DOB: [birth year] DOA: 08/28/2024 DOS: the patient was seen and examined on 08/28/2024 PCP: Terry Wilhelmena Lloyd Hilario, FNP  Patient coming from: Home  Chief Complaint:  Chief Complaint  Patient presents with   Shortness of Breath   HPI: Cathy [name] is a 43 y.o. female with medical history significant of ESRD on hemodialysis, Tuesday, Thursday and Saturday, cirrhosis, depression, lupus, history of pulmonary embolism and bipolar/ depression with presented with altered mental status.  Patient was found down by her family. Unknown for how long patient was on the floor. EMS was called, she wad found confused and febrile, then she was transported to the ED.  On direct questioning she mentions falling down while trying to go the bathroom, unclear if she loss her consciousness or had a head trauma.  Reports having abdominal pain since this morning, constant, severe in intensity and associated with abdominal distention.  Currently she is having thirst, no nausea or vomiting.   Recent hospitalization 09/24 to 08/26/24 for volume overload, hyperkalemia and acidosis. She underwent hemodialysis with ultrafiltration with improvement in her symptoms. HD access is left upper extremity fistula.  She required large volume paracentesis 5,9 L.  Apparently her next HD was scheduled on 08/27/24 but not clear if she went to the outpatient HD unit.   Limited history due to patients somnolence and confusion.    Review of Systems: unable to review all systems due to the inability of the patient to answer questions. Past Medical History:  Diagnosis Date   Anasarca associated with disorder of kidney 06/08/2021   Anxiety    Asthma    Bipolar depression (HCC)    Cirrhosis (HCC)    Depression    Dyspnea    ESRD (end stage renal disease) on dialysis (HCC) 10/2020   GERD (gastroesophageal reflux disease)    Gonorrhea    Lupus    Pelvic  inflammatory disease (PID)    Pleural effusion 06/08/2021   Pulmonary embolism (HCC) 01/31/2022   Renal hypertension    Trichomonas infection    Past Surgical History:  Procedure Laterality Date   A/V FISTULAGRAM N/A 02/22/2024   Procedure: A/V Fistulagram;  Surgeon: Sheree Penne Bruckner, MD;  Location: [facility] INVASIVE CV LAB;  Service: Cardiovascular;  Laterality: N/A;   AV FISTULA PLACEMENT Left 06/10/2022   Procedure: LEFT ARM ARTERIOVENOUS (AV) FISTULA CREATION;  Surgeon: Oris Krystal FALCON, MD;  Location: [facility] ORS;  Service: Vascular;  Laterality: Left;   BASCILIC VEIN TRANSPOSITION Left 03/24/2023   Procedure: LEFT ARM SECOND STAGE BASILIC VEIN TRANSPOSITION;  Surgeon: Oris Krystal FALCON, MD;  Location: [facility] ORS;  Service: Vascular;  Laterality: Left;   CENTRAL LINE INSERTION-TUNNELED N/A 02/22/2024   Procedure: CENTRAL LINE INSERTION-TUNNELED;  Surgeon: Sheree Penne Bruckner, MD;  Location: [facility] INVASIVE CV LAB;  Service: Cardiovascular;  Laterality: N/A;   ESOPHAGEAL DILATION N/A 05/10/2024   Procedure: DILATION, ESOPHAGUS;  Surgeon: Eartha Angelia Sieving, MD;  Location: [facility] ENDO SUITE;  Service: Gastroenterology;  Laterality: N/A;   ESOPHAGOGASTRODUODENOSCOPY N/A 05/10/2024   Procedure: EGD (ESOPHAGOGASTRODUODENOSCOPY);  Surgeon: Eartha Angelia, Sieving, MD;  Location: [facility] ENDO SUITE;  Service: Gastroenterology;  Laterality: N/A;  8:45 am, asa 3  dialysis M,W & F   IR FLUORO GUIDE CV LINE RIGHT  11/30/2020   IR FLUORO GUIDE CV LINE RIGHT  06/13/2021   IR FLUORO GUIDE CV LINE RIGHT  02/01/2022   IR REMOVAL TUN CV CATH W/O FL  06/11/2021  IR REMOVAL TUN CV CATH W/O FL  01/29/2022   IR US  GUIDE VASC ACCESS RIGHT  11/30/2020   IR US  GUIDE VASC ACCESS RIGHT  06/13/2021   IR US  GUIDE VASC ACCESS RIGHT  02/03/2022   RENAL BIOPSY     TEE WITHOUT CARDIOVERSION N/A 06/13/2021   Procedure: TRANSESOPHAGEAL ECHOCARDIOGRAM (TEE);  Surgeon: Delford Maude BROCKS, MD;  Location: [facility] ENDOSCOPY;  Service: Cardiovascular;   Laterality: N/A;   TEE WITHOUT CARDIOVERSION N/A 02/03/2022   Procedure: TRANSESOPHAGEAL ECHOCARDIOGRAM (TEE);  Surgeon: Tobb, Kardie, DO;  Location: [facility] ENDOSCOPY;  Service: Cardiovascular;  Laterality: N/A;   TUBAL LIGATION N/A 02/03/2019   Procedure: POST PARTUM TUBAL LIGATION;  Surgeon: Izell Harari, MD;  Location: [facility] LD ORS;  Service: Gynecology;  Laterality: N/A;   Social History:  reports that she has been smoking cigarettes. She started smoking about 25 years ago. She has a 6.4 pack-year smoking history. She has never used smokeless tobacco. She reports that she does not currently use alcohol. She reports current drug use. Frequency: 3.00 times per week. Drug: Marijuana.  Allergies  Allergen Reactions   Dialyvite 800 [Nephro-Vite] Nausea And Vomiting    Family History  Problem Relation Age of Onset   Anxiety disorder Mother    Sleep apnea Father    Diabetes Maternal Grandmother    Hypertension Maternal Grandmother    Diabetes Maternal Grandfather    Hypertension Maternal Grandfather    Diabetes Paternal Grandmother    Hypertension Paternal Grandmother    Diabetes Paternal Grandfather    Hypertension Paternal Grandfather     Prior to Admission medications   Medication Sig Start Date End Date Taking? Authorizing Provider  acetaminophen  (TYLENOL ) 500 MG tablet Take 1,000 mg by mouth every 6 (six) hours as needed for mild pain (pain score 1-3).    [provider]  albuterol  (VENTOLIN  HFA) 108 (90 Base) MCG/ACT inhaler Inhale 2 puffs into the lungs every 6 (six) hours as needed for wheezing or shortness of breath. 12/18/23   Del Wilhelmena Lloyd Sola, FNP  amLODipine  (NORVASC ) 10 MG tablet Take 1 tablet (10 mg total) by mouth in the morning. 06/16/24   Del Wilhelmena Lloyd Sola, FNP  aspirin  81 MG chewable tablet Chew 1 tablet (81 mg total) by mouth daily. Patient taking differently: Chew 81 mg by mouth at bedtime. 06/04/24   Maree, Pratik D, DO  atorvastatin  (LIPITOR) 40 MG  tablet Take 1 tablet (40 mg total) by mouth daily. 06/04/24   Maree, Pratik D, DO  Blood Pressure Monitoring (OMRON 3 SERIES BP MONITOR) DEVI Use as directed 2 times a day 08/27/23   Del Wilhelmena Lloyd Sola, FNP  busPIRone  (BUSPAR ) 7.5 MG tablet Take 1 tablet (7.5 mg total) by mouth 2 (two) times daily. 08/26/24   Ricky Fines, MD  camphor-menthol  [facility]) lotion Apply topically as needed for itching. Patient taking differently: Apply 1 Application topically as needed for itching. 02/23/24   Tobie Yetta HERO, MD  carvedilol  (COREG ) 12.5 MG tablet Take 1 tablet (12.5 mg total) by mouth 2 (two) times daily with a meal. 06/16/24   Del Wilhelmena Lloyd, Sola, FNP  cloNIDine  (CATAPRES ) 0.3 MG tablet Take 1 tablet (0.3 mg total) by mouth 2 (two) times daily. 06/16/24   Del Wilhelmena Lloyd Sola, FNP  gabapentin  (NEURONTIN ) 300 MG capsule Take 1 capsule (300 mg total) by mouth at bedtime. 06/16/24   Del Orbe Polanco, Iliana, FNP  hydrALAZINE  (APRESOLINE ) 100 MG tablet Take 1 tablet (100 mg total) by  mouth 3 (three) times daily. 06/16/24   Del Orbe Polanco, Iliana, FNP  hydrOXYzine  (ATARAX ) 25 MG tablet Take 25 mg by mouth every 12 (twelve) hours as needed for anxiety or itching. 03/30/24   [provider]  levETIRAcetam  (KEPPRA ) 250 MG tablet Take 2 tablets (500 mg total) by mouth daily AND 1 tablet (250 mg total) Every Tuesday,Thursday,and Saturday with dialysis. Take extra 250mg  dose after dialysis on Tuesday, Thursday, and Saturday. 08/26/24 10/25/24  Ricky Fines, MD  lidocaine  (XYLOCAINE ) 2 % solution Use as directed 15 mLs in the mouth or throat as needed. Patient taking differently: Use as directed 15 mLs in the mouth or throat as needed for mouth pain. 06/16/24   Del Wilhelmena Lloyd Sola, FNP  losartan  (COZAAR ) 100 MG tablet Take 100 mg by mouth daily. 06/17/24   [provider]  oxyCODONE  (OXY IR/ROXICODONE ) 5 MG immediate release tablet Take 1 tablet (5 mg total) by mouth every 8 (eight) hours  as needed for severe pain (pain score 7-10). 08/26/24   Ricky Fines, MD  pantoprazole  (PROTONIX ) 40 MG tablet TAKE 1 TABLET BY MOUTH DAILY BEFORE BREAKFAST 06/02/24   Del Wilhelmena Lloyd Sola, FNP    Physical Exam: Vitals:   08/28/24 2115 08/28/24 2200 08/28/24 2215 08/28/24 2237  BP:  (!) 154/95 (!) 153/87   Pulse: 87 88 82   Resp: (!) 23 12 (!) 24   Temp:    100 F (37.8 C)  TempSrc:    Oral  SpO2: 99%  100%   Weight:      Height:       BP (!) 153/87   Pulse 82   Temp 100 F (37.8 C) (Oral)   Resp (!) 24   Ht 5' 7 (1.702 m)   Wt 85 kg   LMP 04/07/2020 (Within Months)   SpO2 100%   BMI 29.35 kg/m   Neurology somnolent but easy to arouse, responds to simple questions and follows simple commands.  ENT with positive pallor with no icterus Cardiovascular with S1 and S2 present and regular with no gallops, rubs or murmurs Respiratory with rales at bases with no wheezing or rhonchi  Abdomen with distended, and tender to palpation, dull to percussion and positive fluid wave Positive lower extremity edema +++  Data Reviewed:   VBG 7,33/ 34/ 55/ 17.7/ 75.4%  Na 140, K 6.1 Cl 100 bicarbonate 17 glucose 64 bun 91, cr 20,6  Calcium  6,4  AST 61 ALT 21  Lactic acid 1,4  Wbc 10,4 hgb 9,0 plt 71  INR 1,4  Sars covid 19 negative Influenza negative RSV negative   CT head with no acute intracranial abnormalities.  CT chest abdomen and pelvis, small loculated right plural effusion posteriorly and in the right major fissure. Large volume ascites, increasing since prior study.   Assessment and Plan: * End-stage renal disease on hemodialysis [facility]) Patient with azotemia and likely component of uremia.  Hyperkalemia and anion gap metabolic acidosis.  Hypocalcemia.   Plan for sodium zirconium 10 g x 2 doses Check EKG and continue telemetry monitoring Consulted nephrology for renal replacement therapy with ultrafiltration.   SBP (spontaneous bacterial peritonitis)  (HCC) Complicated with severe sepsis, present on admission, acute metabolic encephalopathy.   Plan to follow up on ascitic fluid analysis.  Antibiotic therapy with IV ceftriaxone .  Follow up cell count, temperature curve and cultures.  Admit to step down unit for close monitoring.  Neuro checks q 4 hrs   Hypertension Due to risk  of hypotension hold on losartan , hydralazine , amlodipine  and carvedilol   Will continue clonidine  to avoid rebound hypertension. Continue close blood pressure monitoring   Chronic systolic CHF (congestive heart failure) (HCC) Volume overload due to renal failure with ESRD, not compliant with hemodialysis and ultrafiltration Last echocardiogram with reduced LV systolic function Continue blood pressure monitoring   Cirrhosis of liver with ascites (HCC) Follow up with ascitic fluid analysis. Patient will likely need repeat therapeutic paracentesis.  Consult IR   Hypoglycemia Positive hypoglycemia, likely due to poor oral intake, low glycogen storages and reduced GFR.  Add hypoglycemia protocol Clear liquid diet for now.   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       Advance Care Planning:   Code Status: Full Code   Consults: nephrology   Family Communication: no family at the bedside   Severity of Illness: The appropriate patient status for this patient is INPATIENT. Inpatient status is judged to be reasonable and necessary in order to provide the required intensity of service to ensure the patient's safety. The patient's presenting symptoms, physical exam findings, and initial radiographic and laboratory data in the context of their chronic comorbidities is felt to place them at high risk for  further clinical deterioration. Furthermore, it is not anticipated that the patient will be medically stable for discharge from the hospital within 2 midnights of admission.   * I certify that at the point of admission it is my clinical judgment that the patient will require inpatient hospital care spanning beyond 2 midnights from the point of admission due to high intensity of service, high risk for further deterioration and high frequency of surveillance required.*  Author: Elidia Toribio Furnace, MD 08/28/2024 10:54 PM  For on call review [email or web address].

## 2024-08-28 NOTE — Assessment & Plan Note (Signed)
 Continue with buspirone  hydroxyzine    Seizures continue with Keppra .

## 2024-08-28 NOTE — ED Triage Notes (Signed)
 Family found pt in the floor. Pt is altered and nobody knows last known normal. Per ems pt has fever and she has not answered any questions from them.

## 2024-08-28 NOTE — ED Provider Notes (Signed)
 [place] EMERGENCY DEPARTMENT AT [facility] Provider Note  CSN: [id number] Arrival date & time: 08/28/24 1921  Chief Complaint(s) Shortness of Breath  HPI Cathy [name] is a 43 y.o. female history of lupus nephritis, end-stage renal disease, hypertension, CHF, cirrhosis, presenting to the emergency department with infusion.  Apparently patient was found on the floor by family.  Unknown last known well.  Patient seems very confused.  She is able to state her name and reports I am okay, says she is in the hospital but otherwise not able to answer any questions.  When asked if she has been going to dialysis she reports yes .  History limited due to altered mental status  Past Medical History Past Medical History:  Diagnosis Date   Anasarca associated with disorder of kidney 06/08/2021   Anxiety    Asthma    Bipolar depression (HCC)    Cirrhosis (HCC)    Depression    Dyspnea    ESRD (end stage renal disease) on dialysis (HCC) 10/2020   GERD (gastroesophageal reflux disease)    Gonorrhea    Lupus    Pelvic inflammatory disease (PID)    Pleural effusion 06/08/2021   Pulmonary embolism (HCC) 01/31/2022   Renal hypertension    Trichomonas infection    Patient Active Problem List   Diagnosis Date Noted   SBP (spontaneous bacterial peritonitis) (HCC) 08/28/2024   Neuropathy 08/25/2024   Dental abscess 06/16/2024   Difficulty sleeping 06/16/2024   Acute metabolic encephalopathy 05/31/2024   Hyperammonemia 05/31/2024   NASH (nonalcoholic steatohepatitis) 05/31/2024   Abnormal pleural fluid 05/31/2024   Prolonged QT interval 05/31/2024   Cellulitis 02/20/2024   Chronic migraine w/o aura w/o status migrainosus, not intractable 12/18/2023   Vaginal discharge 12/18/2023   Elevated ferritin 10/19/2023   SLE (systemic lupus erythematosus related syndrome) (HCC) 10/19/2023   Hypertension 08/27/2023   Dyspnea 08/27/2023   Acute on chronic clinical systolic heart  failure (HCC) 88/72/7976   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 cor pulmonale (HCC) 10/27/2022   Gastroesophageal reflux disease    Dysphagia    Abnormal MRI, liver    Cirrhosis of liver with ascites (HCC)    Acute hyperkalemia 06/06/2022   Abdominal pain 03/17/2022   Hypothyroidism, unspecified 03/05/2022   Tense ascites 03/02/2022   Volume overload 03/01/2022   Tobacco use 01/29/2022   Pulmonary embolism (HCC) 01/09/2022   History of pulmonary embolus 01/07/2022   Malignant hypertension 01/06/2022   Anemia due to chronic kidney disease 12/28/2021   Acute on chronic combined systolic and diastolic CHF (congestive heart failure) (HCC) 12/27/2021   Benign essential HTN 06/08/2021   HFrEF (heart failure with reduced ejection fraction) (HCC)    Nephrotic syndrome with focal and segmental glomerular lesions 03/12/2021   Encounter for antineoplastic chemotherapy 01/07/2021   End-stage renal disease on hemodialysis (HCC) 12/23/2020   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   Anxiety and depression 12/14/2020   Iron  deficiency anemia, unspecified 12/14/2020   Systemic lupus erythematosus, unspecified (HCC) 12/14/2020   Secondary hyperparathyroidism of renal origin 12/14/2020   Pericardial effusion-chronic     Interstitial edema 11/07/2020   Pelvic adhesive disease 02/03/2019   Renal hypertension 02/02/2019   Non compliance w medication regimen 01/12/2019   Anemia 10/12/2018   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 Authorizing Provider  acetaminophen  (TYLENOL ) 500 MG tablet Take 1,000 mg by mouth every 6 (six) hours as needed for mild pain (pain score 1-3).    [provider]  albuterol  (VENTOLIN  HFA) 108 (90 Base) MCG/ACT inhaler Inhale 2 puffs into the lungs every 6 (six) hours as  needed for wheezing or shortness of breath. 12/18/23   Del Wilhelmena Lloyd Sola, FNP  amLODipine  (NORVASC ) 10 MG tablet Take 1 tablet (10 mg total) by mouth in the morning. 06/16/24   Del Wilhelmena Lloyd Sola, FNP  aspirin  81 MG chewable tablet Chew 1 tablet (81 mg total) by mouth daily. Patient taking differently: Chew 81 mg by mouth at bedtime. 06/04/24   Maree, Pratik D, DO  atorvastatin  (LIPITOR) 40 MG tablet Take 1 tablet (40 mg total) by mouth daily. 06/04/24   Maree, Pratik D, DO  Blood Pressure Monitoring (OMRON 3 SERIES BP MONITOR) DEVI Use as directed 2 times a day 08/27/23   Del Orbe Polanco, Iliana, FNP  busPIRone  (BUSPAR ) 7.5 MG tablet Take 1 tablet (7.5 mg total) by mouth 2 (two) times daily. 08/26/24   Ricky Fines, MD  camphor-menthol  [facility]) lotion Apply topically as needed for itching. Patient taking differently: Apply 1 Application topically as needed for itching. 02/23/24   Tobie Yetta HERO, MD  carvedilol  (COREG ) 12.5 MG tablet Take 1 tablet (12.5 mg total) by mouth 2 (two) times daily with a meal. 06/16/24   Del Wilhelmena Lloyd, Sola, FNP  cloNIDine  (CATAPRES ) 0.3 MG tablet Take 1 tablet (0.3 mg total) by mouth 2 (two) times daily. 06/16/24   Del Wilhelmena Lloyd Sola, FNP  gabapentin  (NEURONTIN ) 300 MG capsule Take 1 capsule (300 mg total) by mouth at bedtime. 06/16/24   Del Orbe Polanco, Iliana, FNP  hydrALAZINE  (APRESOLINE ) 100 MG tablet Take 1 tablet (100 mg total) by mouth 3 (three) times daily. 06/16/24   Del Orbe Polanco, Iliana, FNP  hydrOXYzine  (ATARAX ) 25 MG tablet Take 25 mg by mouth every 12 (twelve) hours as needed for anxiety or itching. 03/30/24   [provider]  levETIRAcetam  (KEPPRA ) 250 MG tablet Take 2 tablets (500 mg total) by mouth daily AND 1 tablet (250 mg total) Every Tuesday,Thursday,and Saturday with dialysis. Take extra 250mg  dose after dialysis on Tuesday, Thursday, and Saturday. 08/26/24 10/25/24  Ricky Fines, MD  lidocaine  (XYLOCAINE ) 2 % solution Use as  directed 15 mLs in the mouth or throat as needed. Patient taking differently: Use as directed 15 mLs in the mouth or throat as needed for mouth pain. 06/16/24   Del Wilhelmena Lloyd Sola, FNP  losartan  (COZAAR ) 100 MG tablet Take 100 mg by mouth daily. 06/17/24   [provider]  oxyCODONE  (OXY IR/ROXICODONE ) 5 MG immediate release tablet Take 1 tablet (5 mg total) by mouth every 8 (eight) hours as needed for severe pain (pain score 7-10). 08/26/24   Ricky Fines, MD  pantoprazole  (PROTONIX ) 40 MG tablet TAKE 1 TABLET BY MOUTH DAILY BEFORE BREAKFAST 06/02/24   Del Wilhelmena Lloyd Sola, FNP  Past Surgical History Past Surgical History:  Procedure Laterality Date   A/V FISTULAGRAM N/A 02/22/2024   Procedure: A/V Fistulagram;  Surgeon: Sheree Penne Bruckner, MD;  Location[facility] INVAS[facility] CV LAB;  Service: Cardiovascular;  Laterality: N/A;   AV FISTULA PLACEMENT Left 06/10/2022   Procedure: LEFT ARM ARTERIOVENOUS (AV) FISTULA CREATION;  Surgeon: Oris Krystal FALCON, MD;  Location: [facility] ORS;  Service: Vascular;  Laterality: Left;   BASCILIC VEIN TRANSPOSITION Left 03/24/2023   Procedure: LEFT ARM SECOND STAGE BASILIC VEIN TRANSPOSITION;  Surgeon: Oris Krystal FALCON, MD;  Location: [facility] ORS;  Service: Vascular;  Laterality: Left;   CENTRAL LINE INSERTION-TUNNELED N/A 02/22/2024   Procedure: CENTRAL LINE INSERTION-TUNNELED;  Surgeon: Sheree Penne Bruckner, MD;  Location: [facility] INVASIVE CV LAB;  Service: Cardiovascular;  Laterality: N/A;   ESOPHAGEAL DILATION N/A 05/10/2024   Procedure: DILATION, ESOPHAGUS;  Surgeon: Eartha Angelia Sieving, MD;  Location: [facility] ENDO SUITE;  Service: Gastroenterology;  Laterality: N/A;   ESOPHAGOGASTRODUODENOSCOPY N/A 05/10/2024   Procedure: EGD (ESOPHAGOGASTRODUODENOSCOPY);  Surgeon: Eartha Angelia, Sieving, MD;  Location: [facility] ENDO SUITE;  Service:  Gastroenterology;  Laterality: N/A;  8:45 am, asa 3  dialysis M,W & F   IR FLUORO GUIDE CV LINE RIGHT  11/30/2020   IR FLUORO GUIDE CV LINE RIGHT  06/13/2021   IR FLUORO GUIDE CV LINE RIGHT  02/01/2022   IR REMOVAL TUN CV CATH W/O FL  06/11/2021   IR REMOVAL TUN CV CATH W/O FL  01/29/2022   IR US  GUIDE VASC ACCESS RIGHT  11/30/2020   IR US  GUIDE VASC ACCESS RIGHT  06/13/2021   IR US  GUIDE VASC ACCESS RIGHT  02/03/2022   RENAL BIOPSY     TEE WITHOUT CARDIOVERSION N/A 06/13/2021   Procedure: TRANSESOPHAGEAL ECHOCARDIOGRAM (TEE);  Surgeon: Delford Maude BROCKS, MD;  Location: [facility] ENDOSCOPY;  Service: Cardiovascular;  Laterality: N/A;   TEE WITHOUT CARDIOVERSION N/A 02/03/2022   Procedure: TRANSESOPHAGEAL ECHOCARDIOGRAM (TEE);  Surgeon: Tobb, Kardie, DO;  Location: [facility] ENDOSCOPY;  Service: Cardiovascular;  Laterality: N/A;   TUBAL LIGATION N/A 02/03/2019   Procedure: POST PARTUM TUBAL LIGATION;  Surgeon: Izell Harari, MD;  Location: [facility] LD ORS;  Service: Gynecology;  Laterality: N/A;   Family History Family History  Problem Relation Age of Onset   Anxiety disorder Mother    Sleep apnea Father    Diabetes Maternal Grandmother    Hypertension Maternal Grandmother    Diabetes Maternal Grandfather    Hypertension Maternal Grandfather    Diabetes Paternal Grandmother    Hypertension Paternal Grandmother    Diabetes Paternal Grandfather    Hypertension Paternal Grandfather     Social History Social History   Tobacco Use   Smoking status: Every Day    Current packs/day: 0.25    Average packs/day: 0.3 packs/day for 25.7 years (6.4 ttl pk-yrs)    Types: Cigarettes    Start date: 2000   Smokeless tobacco: Never   Tobacco comments:    1-2 cigarettes daily   Vaping Use   Vaping status: Never Used  Substance Use Topics   Alcohol use: Not Currently   Drug use: Yes    Frequency: 3.0 times per week    Types: Marijuana   Allergies Dialyvite 800 [nephro-vite]  Review of Systems Review of Systems   All other systems reviewed and are negative.   Physical Exam Vital Signs  I have reviewed the triage vital signs BP (!) 153/87   Pulse 82   Temp 100 F (37.8 C) (Oral)  Resp (!) 24   Ht 5' 7 (1.702 m)   Wt 85 kg   LMP 04/07/2020 (Within Months)   SpO2 100%   BMI 29.35 kg/m  Physical Exam Vitals and nursing note reviewed.  Constitutional:      General: She is not in acute distress.    Appearance: She is well-developed. She is ill-appearing.  HENT:     Head: Normocephalic and atraumatic.     Mouth/Throat:     Mouth: Mucous membranes are moist.  Eyes:     Pupils: Pupils are equal, round, and reactive to light.  Cardiovascular:     Rate and Rhythm: Normal rate and regular rhythm.     Heart sounds: No murmur heard. Pulmonary:     Effort: Pulmonary effort is normal. No respiratory distress.     Breath sounds: Rales (basilar) present.  Abdominal:     General: Abdomen is flat. There is distension.     Palpations: Abdomen is soft.     Tenderness: There is abdominal tenderness (generalized).  Musculoskeletal:        General: No tenderness.     Right lower leg: Edema present.     Left lower leg: Edema present.  Skin:    General: Skin is warm and dry.  Neurological:     Mental Status: She is alert.     Comments: Moves all of her extremities equally, no cranial nerve deficit, following commands, oriented to self and place but not to situation  Psychiatric:        Mood and Affect: Mood normal.        Behavior: Behavior normal.     ED Results and Treatments Labs (all labs ordered are listed, but only abnormal results are displayed) Labs Reviewed  COMPREHENSIVE METABOLIC PANEL WITH GFR - Abnormal; Notable for the following components:      Result Value   Potassium 6.1 (*)    CO2 17 (*)    Glucose, Bld 64 (*)    BUN 91 (*)    Creatinine, Ser 20.60 (*)    Calcium  6.4 (*)    Total Protein 5.6 (*)    Albumin  2.9 (*)    AST 61 (*)    Alkaline Phosphatase 180 (*)     Total Bilirubin 1.5 (*)    GFR, Estimated 2 (*)    Anion gap 23 (*)    All other components within normal limits  CBC WITH DIFFERENTIAL/PLATELET - Abnormal; Notable for the following components:   WBC 10.7 (*)    RBC 3.07 (*)    Hemoglobin 9.0 (*)    HCT 28.2 (*)    RDW 16.0 (*)    Platelets 71 (*)    nRBC 0.4 (*)    Neutro Abs 9.8 (*)    Lymphs Abs 0.2 (*)    Abs Immature Granulocytes 0.16 (*)    All other components within normal limits  BLOOD GAS, VENOUS - Abnormal; Notable for the following components:   pCO2, Ven 34 (*)    pO2, Ven 55 (*)    Bicarbonate 17.7 (*)    Acid-base deficit 6.8 (*)    All other components within normal limits  PROTIME-INR - Abnormal; Notable for the following components:   Prothrombin Time 17.4 (*)    INR 1.4 (*)    All other components within normal limits  CBG MONITORING, ED - Abnormal; Notable for the following components:   Glucose-Capillary 36 (*)    All other components within normal limits  CBG MONITORING, ED - Abnormal; Notable for the following components:   Glucose-Capillary 35 (*)    All other components within normal limits  RESP PANEL BY RT-PCR (RSV, FLU A&B, COVID)  RVPGX2  CULTURE, BLOOD (ROUTINE X 2)  CULTURE, BLOOD (ROUTINE X 2)  BODY FLUID CULTURE W GRAM STAIN  TSH  AMMONIA  ETHANOL  LACTIC ACID, PLASMA  LIPASE, BLOOD  URINALYSIS, W/ REFLEX TO CULTURE (INFECTION SUSPECTED)  BODY FLUID CELL COUNT WITH DIFFERENTIAL  I-STAT CHEM 8, ED  CBG MONITORING, ED                                                                                                                          Radiology CT Head Wo Contrast Result Date: 08/28/2024 CLINICAL DATA:  Mental status changes. EXAM: CT HEAD WITHOUT CONTRAST TECHNIQUE: Contiguous axial images were obtained from the base of the skull through the vertex without intravenous contrast. RADIATION DOSE REDUCTION: This exam was performed according to the departmental dose-optimization program  which includes automated exposure control, adjustment of the mA and/or kV according to patient size and/or use of iterative reconstruction technique. COMPARISON:  05/31/2024 FINDINGS: Brain: No acute intracranial abnormality. Specifically, no hemorrhage, hydrocephalus, mass lesion, acute infarction, or significant intracranial injury. Vascular: No hyperdense vessel or unexpected calcification. Skull: No acute calvarial abnormality. Sinuses/Orbits: No acute findings. Mucosal thickening in the ethmoid air cells and maxillary sinuses. Other: None IMPRESSION: No acute intracranial abnormality. Chronic sinusitis. Electronically Signed   By: Franky Crease M.D.   On: 08/28/2024 21:37   CT CHEST ABDOMEN PELVIS W CONTRAST Result Date: 08/28/2024 CLINICAL DATA:  Sepsis suspected.  Found on floor. EXAM: CT CHEST, ABDOMEN, AND PELVIS WITH CONTRAST TECHNIQUE: Multidetector CT imaging of the chest, abdomen and pelvis was performed following the standard protocol during bolus administration of intravenous contrast. RADIATION DOSE REDUCTION: This exam was performed according to the departmental dose-optimization program which includes automated exposure control, adjustment of the mA and/or kV according to patient size and/or use of iterative reconstruction technique. CONTRAST:  OMNIPAQUE  IOHEXOL  300 MG/ML  SOLN COMPARISON:  02/01/2024 FINDINGS: CT CHEST FINDINGS Cardiovascular: Cardiomegaly. Scattered coronary artery calcifications. Dialysis catheter tip in the right atrium. Aorta normal caliber. Mediastinum/Nodes: No mediastinal, hilar, or axillary adenopathy. Trachea and esophagus are unremarkable. Thyroid  unremarkable. Lungs/Pleura: Small right pleural effusion loculated superiorly and posteriorly and within the major fissure. No confluent airspace opacities or overt edema. Musculoskeletal: Chest wall soft tissues are unremarkable. No acute bony abnormality. CT ABDOMEN PELVIS FINDINGS Hepatobiliary: No focal hepatic  abnormality. Gallbladder unremarkable. Pancreas: No focal abnormality or ductal dilatation. Spleen: No focal abnormality.  Normal size. Adrenals/Urinary Tract: Kidneys are atrophic. No renal or adrenal mass. No hydronephrosis. Urinary bladder not visualized. Stomach/Bowel: Normal appendix. Stomach, large and small bowel grossly unremarkable. Vascular/Lymphatic: No evidence of aneurysm or adenopathy. Diffuse aortic atherosclerosis. Reproductive: Uterus and adnexa unremarkable.  No mass. Other: Large volume ascites, slightly increased since prior study. Musculoskeletal: No acute bony abnormality.  IMPRESSION: Cardiomegaly, coronary artery disease.  Aortic atherosclerosis. Small loculated right pleural effusion posteriorly and in the right major fissure. Large volume ascites, increasing since prior study. Electronically Signed   By: Franky Crease M.D.   On: 08/28/2024 21:34    Pertinent labs & imaging results that were available during my care of the patient were reviewed by me and considered in my medical decision making (see MDM for details).  Medications Ordered in ED Medications  cefTRIAXone  (ROCEPHIN ) 2 g in sodium chloride  0.9 % 100 mL IVPB (has no administration in time range)  sodium zirconium cyclosilicate  (LOKELMA ) packet 10 g (has no administration in time range)  Chlorhexidine  Gluconate Cloth 2 % PADS 6 each (has no administration in time range)  sodium chloride  0.9 % bolus 500 mL (0 mLs Intravenous Stopped 08/28/24 2223)  acetaminophen  (TYLENOL ) tablet 1,000 mg (1,000 mg Oral Given 08/28/24 2145)  lidocaine  (PF) (XYLOCAINE ) 1 % injection 5 mL (5 mLs Other Given 08/28/24 2223)  iohexol  (OMNIPAQUE ) 300 MG/ML solution 100 mL (100 mLs Intravenous Contrast Given 08/28/24 2114)  dextrose  50 % solution 25 g (25 g Intravenous Given 08/28/24 2206)                                                                                                                                     Procedures .Critical  Care  Performed by: Francesca Elsie CROME, MD Authorized by: Francesca Elsie CROME, MD   Critical care provider statement:    Critical care time (minutes):  30   Critical care was time spent personally by me on the following activities:  Development of treatment plan with patient or surrogate, discussions with consultants, evaluation of patient's response to treatment, examination of patient, ordering and review of laboratory studies, ordering and review of radiographic studies, ordering and performing treatments and interventions, pulse oximetry, re-evaluation of patient's condition and review of old charts   Care discussed with: admitting provider     (including critical care time)  Medical Decision Making / ED Course   MDM:  43 year old presenting to the emergency department with altered mental status.  Patient initially with fever, tachycardia.  Improved on recheck.  No hypotension.  Appears slightly volume overloaded.  Also with abdominal distention and concern for ascites.  Differential includes intracranial process, toxic or metabolic process such as hepatic encephalopathy, sepsis, COVID.  Will check labs.  Anticipate patient will probably need to have diagnostic paracentesis, differential includes SBP.  Will obtain CT head.  Given confusion and chronic illness will pursue further test including CT scans.  Anticipate patient will need admission.  Clinical Course as of 08/28/24 2305  [place] Aug 28, 2024  2301 Performed diagnostic paracentesis.  Patient was consented for procedure.  Some additional fluid was obtained.  Patient tolerated well.  Discussed with Dr. Tobie with nephrology.  Labs notable for chronic renal failure with hyperkalemia and uremia.  He will help arrange dialysis  for the patient.  Suspect likely source of patient's fever is SBP, will cover empirically.  CT, CT chest abdomen pelvis without evidence of acute process. Patient also developed episode of hypoglycemia likely due  to infection/sbp. Discussed with Dr. Noralee who will admit [WS]    Clinical Course User Index [WS] Francesca Elsie CROME, MD     Additional history obtained: -Additional history obtained from ems -External records from outside source obtained and reviewed including: Chart review including previous notes, labs, imaging, consultation notes including prior notes    Lab Tests: -I ordered, reviewed, and interpreted labs.   The pertinent results include:   Labs Reviewed  COMPREHENSIVE METABOLIC PANEL WITH GFR - Abnormal; Notable for the following components:      Result Value   Potassium 6.1 (*)    CO2 17 (*)    Glucose, Bld 64 (*)    BUN 91 (*)    Creatinine, Ser 20.60 (*)    Calcium  6.4 (*)    Total Protein 5.6 (*)    Albumin  2.9 (*)    AST 61 (*)    Alkaline Phosphatase 180 (*)    Total Bilirubin 1.5 (*)    GFR, Estimated 2 (*)    Anion gap 23 (*)    All other components within normal limits  CBC WITH DIFFERENTIAL/PLATELET - Abnormal; Notable for the following components:   WBC 10.7 (*)    RBC 3.07 (*)    Hemoglobin 9.0 (*)    HCT 28.2 (*)    RDW 16.0 (*)    Platelets 71 (*)    nRBC 0.4 (*)    Neutro Abs 9.8 (*)    Lymphs Abs 0.2 (*)    Abs Immature Granulocytes 0.16 (*)    All other components within normal limits  BLOOD GAS, VENOUS - Abnormal; Notable for the following components:   pCO2, Ven 34 (*)    pO2, Ven 55 (*)    Bicarbonate 17.7 (*)    Acid-base deficit 6.8 (*)    All other components within normal limits  PROTIME-INR - Abnormal; Notable for the following components:   Prothrombin Time 17.4 (*)    INR 1.4 (*)    All other components within normal limits  CBG MONITORING, ED - Abnormal; Notable for the following components:   Glucose-Capillary 36 (*)    All other components within normal limits  CBG MONITORING, ED - Abnormal; Notable for the following components:   Glucose-Capillary 35 (*)    All other components within normal limits  RESP PANEL BY  RT-PCR (RSV, FLU A&B, COVID)  RVPGX2  CULTURE, BLOOD (ROUTINE X 2)  CULTURE, BLOOD (ROUTINE X 2)  BODY FLUID CULTURE W GRAM STAIN  TSH  AMMONIA  ETHANOL  LACTIC ACID, PLASMA  LIPASE, BLOOD  URINALYSIS, W/ REFLEX TO CULTURE (INFECTION SUSPECTED)  BODY FLUID CELL COUNT WITH DIFFERENTIAL  I-STAT CHEM 8, ED  CBG MONITORING, ED    Notable for leukocytosis, ESRD, hyperkalemia   EKG   EKG Interpretation Date/Time:  Sunday August 28 2024 19:45:50 EDT Ventricular Rate:  88 PR Interval:  163 QRS Duration:  86 QT Interval:  397 QTC Calculation: 481 R Axis:   86  Text Interpretation: Sinus rhythm Low voltage, extremity leads Probable anterolateral infarct, old Confirmed by Francesca Elsie ([id number]) on 08/28/2024 9:47:28 PM         Imaging Studies ordered: I ordered imaging studies including CT head , C/A/P On my interpretation imaging demonstrates ascites  I independently visualized  and interpreted imaging. I agree with the radiologist interpretation   Medicines ordered and prescription drug management: Meds ordered this encounter  Medications   sodium chloride  0.9 % bolus 500 mL   acetaminophen  (TYLENOL ) tablet 1,000 mg   lidocaine  (PF) (XYLOCAINE ) 1 % injection 5 mL   iohexol  (OMNIPAQUE ) 300 MG/ML solution 100 mL   dextrose  50 % solution 25 g   dextrose  50 % solution    Belton, Kristy A: cabinet override   cefTRIAXone  (ROCEPHIN ) 2 g in sodium chloride  0.9 % 100 mL IVPB    Antibiotic Indication::   Intra-abdominal   sodium zirconium cyclosilicate  (LOKELMA ) packet 10 g   Chlorhexidine  Gluconate Cloth 2 % PADS 6 each    -I have reviewed the patients home medicines and have made adjustments as needed   Consultations Obtained: I requested consultation with the hospitalist,  and discussed lab and imaging findings as well as pertinent plan - they recommend: admission   Cardiac Monitoring: The patient was maintained on a cardiac monitor.  I personally viewed and  interpreted the cardiac monitored which showed an underlying rhythm of: NSR  Social Determinants of Health:  Diagnosis or treatment significantly limited by social determinants of health: obesity   Reevaluation: After the interventions noted above, I reevaluated the patient and found that their symptoms have improved  Co morbidities that complicate the patient evaluation  Past Medical History:  Diagnosis Date   Anasarca associated with disorder of kidney 06/08/2021   Anxiety    Asthma    Bipolar depression (HCC)    Cirrhosis (HCC)    Depression    Dyspnea    ESRD (end stage renal disease) on dialysis (HCC) 10/2020   GERD (gastroesophageal reflux disease)    Gonorrhea    Lupus    Pelvic inflammatory disease (PID)    Pleural effusion 06/08/2021   Pulmonary embolism (HCC) 01/31/2022   Renal hypertension    Trichomonas infection       Dispostion: Disposition decision including need for hospitalization was considered, and patient admitted to the hospital.    Final Clinical Impression(s) / ED Diagnoses Final diagnoses:  Sepsis, due to unspecified organism, unspecified whether acute organ dysfunction present (HCC)  Hyperkalemia  Hypoglycemia     This chart was dictated using voice recognition software.  Despite best efforts to proofread,  errors can occur which can change the documentation meaning.    Francesca Elsie CROME, MD 08/28/24 [phone]

## 2024-08-28 NOTE — Assessment & Plan Note (Signed)
 Positive hypoglycemia, likely due to poor oral intake, low glycogen storages and reduced GFR.  Add hypoglycemia protocol Clear liquid diet for now.

## 2024-08-28 NOTE — Assessment & Plan Note (Signed)
 Patient with azotemia and likely component of uremia.  Hyperkalemia and anion gap metabolic acidosis.  Hypocalcemia.   Plan for sodium zirconium 10 g x 2 doses Check EKG and continue telemetry monitoring Consulted nephrology for renal replacement therapy with ultrafiltration.

## 2024-08-28 NOTE — Assessment & Plan Note (Signed)
 Due to risk of hypotension hold on losartan , hydralazine , amlodipine  and carvedilol   Will continue clonidine  to avoid rebound hypertension. Continue close blood pressure monitoring

## 2024-08-28 NOTE — Assessment & Plan Note (Signed)
 Patient currently not on anticoagulation

## 2024-08-28 NOTE — Assessment & Plan Note (Addendum)
 Complicated with severe sepsis, present on admission, acute metabolic encephalopathy.   Plan to follow up on ascitic fluid analysis.  Antibiotic therapy with IV ceftriaxone .  Follow up cell count, temperature curve and cultures.  Admit to step down unit for close monitoring.  Neuro checks q 4 hrs

## 2024-08-28 NOTE — Assessment & Plan Note (Signed)
 Follow up as outpatient, no clinical signs of exacerbation

## 2024-08-28 NOTE — Assessment & Plan Note (Signed)
 Follow up with ascitic fluid analysis. Patient will likely need repeat therapeutic paracentesis.  Consult IR

## 2024-08-28 NOTE — ED Notes (Signed)
 Patient transported to CT

## 2024-08-28 NOTE — Assessment & Plan Note (Signed)
 Volume overload due to renal failure with ESRD, not compliant with hemodialysis and ultrafiltration Last echocardiogram with reduced LV systolic function Continue blood pressure monitoring

## 2024-08-29 ENCOUNTER — Encounter (HOSPITAL_COMMUNITY): Payer: Self-pay | Admitting: Internal Medicine

## 2024-08-29 ENCOUNTER — Encounter: Payer: Self-pay | Admitting: Hematology and Oncology

## 2024-08-29 ENCOUNTER — Inpatient Hospital Stay (HOSPITAL_COMMUNITY)

## 2024-08-29 DIAGNOSIS — R7881 Bacteremia: Secondary | ICD-10-CM | POA: Diagnosis not present

## 2024-08-29 DIAGNOSIS — A419 Sepsis, unspecified organism: Secondary | ICD-10-CM | POA: Diagnosis not present

## 2024-08-29 DIAGNOSIS — N186 End stage renal disease: Secondary | ICD-10-CM | POA: Diagnosis not present

## 2024-08-29 DIAGNOSIS — B9561 Methicillin susceptible Staphylococcus aureus infection as the cause of diseases classified elsewhere: Secondary | ICD-10-CM | POA: Diagnosis not present

## 2024-08-29 DIAGNOSIS — R4182 Altered mental status, unspecified: Secondary | ICD-10-CM

## 2024-08-29 DIAGNOSIS — K7581 Nonalcoholic steatohepatitis (NASH): Secondary | ICD-10-CM

## 2024-08-29 DIAGNOSIS — M329 Systemic lupus erythematosus, unspecified: Secondary | ICD-10-CM

## 2024-08-29 DIAGNOSIS — I351 Nonrheumatic aortic (valve) insufficiency: Secondary | ICD-10-CM | POA: Diagnosis not present

## 2024-08-29 DIAGNOSIS — I5020 Unspecified systolic (congestive) heart failure: Secondary | ICD-10-CM

## 2024-08-29 DIAGNOSIS — I361 Nonrheumatic tricuspid (valve) insufficiency: Secondary | ICD-10-CM | POA: Diagnosis not present

## 2024-08-29 DIAGNOSIS — Z992 Dependence on renal dialysis: Secondary | ICD-10-CM | POA: Diagnosis not present

## 2024-08-29 DIAGNOSIS — R188 Other ascites: Secondary | ICD-10-CM | POA: Diagnosis not present

## 2024-08-29 LAB — CBG MONITORING, ED
Glucose-Capillary: 65 mg/dL — ABNORMAL LOW (ref 70–99)
Glucose-Capillary: 74 mg/dL (ref 70–99)

## 2024-08-29 LAB — GLUCOSE, CAPILLARY
Glucose-Capillary: 79 mg/dL (ref 70–99)
Glucose-Capillary: 143 mg/dL — ABNORMAL HIGH (ref 70–99)
Glucose-Capillary: 55 mg/dL — ABNORMAL LOW (ref 70–99)
Glucose-Capillary: 102 mg/dL — ABNORMAL HIGH (ref 70–99)
Glucose-Capillary: 53 mg/dL — ABNORMAL LOW (ref 70–99)
Glucose-Capillary: 70 mg/dL (ref 70–99)
Glucose-Capillary: 33 mg/dL — CL (ref 70–99)
Glucose-Capillary: 92 mg/dL (ref 70–99)
Glucose-Capillary: 65 mg/dL — ABNORMAL LOW (ref 70–99)
Glucose-Capillary: 73 mg/dL (ref 70–99)
Glucose-Capillary: 114 mg/dL — ABNORMAL HIGH (ref 70–99)
Glucose-Capillary: 68 mg/dL — ABNORMAL LOW (ref 70–99)

## 2024-08-29 LAB — COMPREHENSIVE METABOLIC PANEL WITH GFR
ALT: 26 U/L (ref 0–44)
AST: 73 U/L — ABNORMAL HIGH (ref 15–41)
Albumin: 2.6 g/dL — ABNORMAL LOW (ref 3.5–5.0)
Alkaline Phosphatase: 148 U/L — ABNORMAL HIGH (ref 38–126)
Anion gap: 19 — ABNORMAL HIGH (ref 5–15)
BUN: 92 mg/dL — ABNORMAL HIGH (ref 6–20)
CO2: 17 mmol/L — ABNORMAL LOW (ref 22–32)
Calcium: 5.7 mg/dL — CL (ref 8.9–10.3)
Chloride: 102 mmol/L (ref 98–111)
Creatinine, Ser: 20.41 mg/dL — ABNORMAL HIGH (ref 0.44–1.00)
GFR, Estimated: 2 mL/min — ABNORMAL LOW (ref >60)
Glucose, Bld: 149 mg/dL — ABNORMAL HIGH (ref 70–99)
Potassium: 5.7 mmol/L — ABNORMAL HIGH (ref 3.5–5.1)
Sodium: 138 mmol/L (ref 135–145)
Total Bilirubin: 1.1 mg/dL (ref 0.0–1.2)
Total Protein: 5.1 g/dL — ABNORMAL LOW (ref 6.5–8.1)

## 2024-08-29 LAB — BLOOD CULTURE ID PANEL (REFLEXED) - BCID2

## 2024-08-29 LAB — BODY FLUID CELL COUNT WITH DIFFERENTIAL
Eos, Fluid: 0 %
Lymphs, Fluid: 3 %
Monocyte-Macrophage-Serous Fluid: 1 % — ABNORMAL LOW (ref 50–90)
Neutrophil Count, Fluid: 10 % (ref 0–25)
Other Cells, Fluid: NONE SEEN %
Total Nucleated Cell Count, Fluid: 27 uL (ref 0–1000)

## 2024-08-29 LAB — CBC
HCT: 25.2 % — ABNORMAL LOW (ref 36.0–46.0)
Hemoglobin: 8.1 g/dL — ABNORMAL LOW (ref 12.0–15.0)
MCH: 29.3 pg (ref 26.0–34.0)
MCHC: 32.1 g/dL (ref 30.0–36.0)
MCV: 91.3 fL (ref 80.0–100.0)
Platelets: 70 K/uL — ABNORMAL LOW (ref 150–400)
RBC: 2.76 MIL/uL — ABNORMAL LOW (ref 3.87–5.11)
RDW: 16 % — ABNORMAL HIGH (ref 11.5–15.5)
WBC: 15 K/uL — ABNORMAL HIGH (ref 4.0–10.5)
nRBC: 0 % (ref 0.0–0.2)

## 2024-08-29 LAB — MRSA NEXT GEN BY PCR, NASAL: MRSA by PCR Next Gen: NOT DETECTED

## 2024-08-29 MED ORDER — HEPARIN SODIUM (PORCINE) 1000 UNIT/ML IJ SOLN
INTRAMUSCULAR | Status: AC
  Filled 2024-08-29: qty 4

## 2024-08-29 MED ORDER — MIDODRINE HCL 5 MG PO TABS
5.0000 mg | ORAL_TABLET | Freq: Once | ORAL | Status: AC
  Administered 2024-08-29: 5 mg via ORAL
  Filled 2024-08-29: qty 1

## 2024-08-29 MED ORDER — DEXTROSE 10 % IV SOLN: INTRAVENOUS | Status: DC

## 2024-08-29 MED ORDER — ANTICOAGULANT SODIUM CITRATE 4% (200MG/5ML) IV SOLN: 5.0000 mL | Status: DC | PRN

## 2024-08-29 MED ORDER — SODIUM ZIRCONIUM CYCLOSILICATE 10 G PO PACK
10.0000 g | PACK | ORAL | Status: AC
Start: 2024-08-29 — End: 2024-08-29
  Administered 2024-08-29 (×2): 10 g via ORAL
  Filled 2024-08-29 (×2): qty 1

## 2024-08-29 MED ORDER — SODIUM CHLORIDE 0.9 % IV SOLN: 2.0000 g | INTRAVENOUS | Status: DC

## 2024-08-29 MED ORDER — ALTEPLASE 2 MG IJ SOLR: 2.0000 mg | Freq: Once | INTRAMUSCULAR | Status: DC | PRN

## 2024-08-29 MED ORDER — DEXTROSE 50 % IV SOLN
12.5000 g | INTRAVENOUS | Status: AC
  Administered 2024-08-29: 12.5 g via INTRAVENOUS

## 2024-08-29 MED ORDER — ATORVASTATIN CALCIUM 40 MG PO TABS
40.0000 mg | ORAL_TABLET | Freq: Every day | ORAL | Status: DC
  Administered 2024-08-29: 40 mg via ORAL
  Filled 2024-08-29: qty 1

## 2024-08-29 MED ORDER — DEXTROSE 5 % IV SOLN: INTRAVENOUS | Status: DC

## 2024-08-29 MED ORDER — DEXTROSE 50 % IV SOLN
INTRAVENOUS | Status: AC
  Filled 2024-08-29: qty 50

## 2024-08-29 MED ORDER — PENTAFLUOROPROP-TETRAFLUOROETH EX AERO
1.0000 | INHALATION_SPRAY | CUTANEOUS | Status: DC | PRN
  Administered 2024-08-29: 1 via TOPICAL
  Filled 2024-08-29: qty 30

## 2024-08-29 MED ORDER — LIDOCAINE-PRILOCAINE 2.5-2.5 % EX CREA: 1.0000 | TOPICAL_CREAM | CUTANEOUS | Status: DC | PRN

## 2024-08-29 MED ORDER — DEXTROSE 50 % IV SOLN
INTRAVENOUS | Status: AC
  Administered 2024-08-29: 50 mL
  Filled 2024-08-29: qty 50

## 2024-08-29 MED ORDER — HEPARIN SODIUM (PORCINE) 1000 UNIT/ML DIALYSIS: 1000.0000 [IU] | INTRAMUSCULAR | Status: DC | PRN

## 2024-08-29 MED ORDER — PROCHLORPERAZINE EDISYLATE 10 MG/2ML IJ SOLN
5.0000 mg | Freq: Four times a day (QID) | INTRAMUSCULAR | Status: DC | PRN
  Administered 2024-08-29 – 2024-09-01 (×4): 5 mg via INTRAVENOUS
  Filled 2024-08-29 (×4): qty 2

## 2024-08-29 MED ORDER — CALCIUM GLUCONATE-NACL 1-0.675 GM/50ML-% IV SOLN
1.0000 g | Freq: Once | INTRAVENOUS | Status: AC
  Administered 2024-08-29: 1000 mg via INTRAVENOUS
  Filled 2024-08-29: qty 50

## 2024-08-29 MED ORDER — LIDOCAINE HCL (PF) 1 % IJ SOLN: 5.0000 mL | INTRAMUSCULAR | Status: DC | PRN

## 2024-08-29 MED ORDER — CEFAZOLIN SODIUM-DEXTROSE 1-4 GM/50ML-% IV SOLN
1.0000 g | INTRAVENOUS | Status: DC
  Administered 2024-08-29 – 2024-08-30 (×2): 1 g via INTRAVENOUS
  Filled 2024-08-29 (×3): qty 50

## 2024-08-29 MED ORDER — DEXTROSE 50 % IV SOLN
25.0000 g | INTRAVENOUS | Status: AC
  Administered 2024-08-29: 25 g via INTRAVENOUS

## 2024-08-29 MED ORDER — HEPARIN SODIUM (PORCINE) 1000 UNIT/ML DIALYSIS
40.0000 [IU]/kg | INTRAMUSCULAR | Status: DC | PRN
  Administered 2024-08-29: 3400 [IU] via INTRAVENOUS_CENTRAL

## 2024-08-29 NOTE — Consult Note (Signed)
 ESRD Consult Note  Requesting provider:  Dr. Maree  Reason for consult: ESRD, provision of dialysis  Assessment/Recommendations:  ESRD  -outpatient HD orders (per recent admission):  [facility] on MWF . EDW 69 kg HD Bath 1K/2.5Ca, Na 138 Time 4:00 Heparin  500 bolus then 500 units/hr. Access LUE AVF BFR 400 DFR 600, 15g 1in, Nipro 17H, Temp 37 -HD today, maintain MWF for now  Sepsis secondary to SBP/gram positive bacteremia -abx/mgmt per primary service--currently receiving rocephin   Hyperkalemia -K down to 5.7, HD planned for today  Volume/ hypertension  -EDW 69kg. UF as tolerated -anti-HTNs currently on hold given concern for shock  Chronic Systolic CHF -UF as tolerated with HD  Cirrhosis -s/p diagnostic paracentesis -per primary service  Anemia of Chronic Kidney Disease Hemoglobin 9.  -Avoid IV iron  in the context of bacteremia -Transfuse PRN for Hgb <7  Secondary Hyperparathyroidism/Hyperphosphatemia - resume home meds, monitor phos   Recommendations were discussed with the primary team.  Ephriam Stank, MD [facility]  History of Present Illness: Cathy [name] is a/an 43 y.o. female with a past medical history of ESRD, lupus, cirrhosis, history of PE, bipolar/depression who presents with AMS, found down by her family (downtime unknown).  Upon arrival of EMS, she was found to be confused and febrile.  She was recently discharged from here on 9/26, was admitted for volume overload/hyperkalemia/acidosis, she did undergo LVP at that time. She was hyperkalemic on presentation with K of 6.1-improved with med mgmt. Underwent diagnostic paracentesis on 9/28.  Blood cultures so far positive for gram-positive cocci.  Temperature on presentation was 101.4. Patient seen and examined bedside. She reports feeling tired, denies any chest pain/SOB.   Medications:  Current Facility-Administered Medications  Medication Dose Route Frequency Provider Last Rate  Last Admin   acetaminophen  (TYLENOL ) tablet 650 mg  650 mg Oral Q6H PRN Arrien, Elidia Sieving, MD       Or   acetaminophen  (TYLENOL ) suppository 650 mg  650 mg Rectal Q6H PRN Arrien, Elidia Sieving, MD       albuterol  (PROVENTIL ) (2.5 MG/3ML) 0.083% nebulizer solution 2.5 mg  2.5 mg Nebulization Q6H PRN Arrien, Mauricio Daniel, MD       aspirin  chewable tablet 81 mg  81 mg Oral QHS Arrien, Elidia Sieving, MD   81 mg at 08/28/24 2354   atorvastatin  (LIPITOR) tablet 40 mg  40 mg Oral Daily Arrien, Elidia Sieving, MD       busPIRone  (BUSPAR ) tablet 7.5 mg  7.5 mg Oral BID Arrien, Elidia Sieving, MD   7.5 mg at 08/28/24 2354   calcium  gluconate 1 g/ 50 mL sodium chloride  IVPB  1 g Intravenous Once Shah, Pratik D, DO       [START ON 08/30/2024] cefTRIAXone  (ROCEPHIN ) 2 g in sodium chloride  0.9 % 100 mL IVPB  2 g Intravenous Q24H Arrien, Elidia Sieving, MD       Chlorhexidine  Gluconate Cloth 2 % PADS 6 each  6 each Topical Q0600 Tobie Gordy POUR, MD   6 each at 08/29/24 0109   cloNIDine  (CATAPRES ) tablet 0.3 mg  0.3 mg Oral BID Arrien, Mauricio Daniel, MD   0.3 mg at 08/28/24 2354   gabapentin  (NEURONTIN ) capsule 300 mg  300 mg Oral QHS Arrien, Mauricio Daniel, MD   300 mg at 08/28/24 2354   heparin  injection 5,000 Units  5,000 Units Subcutaneous Q8H Arrien, Mauricio Daniel, MD   5,000 Units at 08/29/24 0549   hydrOXYzine  (ATARAX ) tablet 25 mg  25  mg Oral Q12H PRN Arrien, Elidia Sieving, MD       levETIRAcetam  (KEPPRA ) tablet 500 mg  500 mg Oral Daily Arrien, Elidia Sieving, MD       And   NOREEN ON 08/30/2024] levETIRAcetam  (KEPPRA ) tablet 250 mg  250 mg Oral Q T,Th,Sa-HD Arrien, Elidia Sieving, MD       pantoprazole  (PROTONIX ) EC tablet 40 mg  40 mg Oral QAC breakfast Arrien, Elidia Sieving, MD         ALLERGIES Dialyvite 800 [nephro-vite]  MEDICAL HISTORY Past Medical History:  Diagnosis Date   Anasarca associated with disorder of kidney 06/08/2021   Anxiety    Asthma    Bipolar  depression (HCC)    Cirrhosis (HCC)    Depression    Dyspnea    ESRD (end stage renal disease) on dialysis (HCC) 10/2020   GERD (gastroesophageal reflux disease)    Gonorrhea    Lupus    Pelvic inflammatory disease (PID)    Pleural effusion 06/08/2021   Pulmonary embolism (HCC) 01/31/2022   Renal hypertension    Trichomonas infection      SOCIAL HISTORY Social History   Socioeconomic History   Marital status: Single    Spouse name: Not on file   Number of children: Not on file   Years of education: Not on file   Highest education level: Not on file  Occupational History   Occupation: unemployed  Tobacco Use   Smoking status: Every Day    Current packs/day: 0.25    Average packs/day: 0.3 packs/day for 25.7 years (6.4 ttl pk-yrs)    Types: Cigarettes    Start date: 2000   Smokeless tobacco: Never   Tobacco comments:    1-2 cigarettes daily   Vaping Use   Vaping status: Never Used  Substance and Sexual Activity   Alcohol use: Not Currently   Drug use: Yes    Frequency: 3.0 times per week    Types: Marijuana   Sexual activity: Not Currently    Birth control/protection: None  Other Topics Concern   Not on file  Social History Narrative   Not on file   Social Drivers of Health   Financial Resource Strain: Low Risk  (02/18/2024)   Overall Financial Resource Strain (CARDIA)    Difficulty of Paying Living Expenses: Not very hard  Food Insecurity: No Food Insecurity (08/25/2024)   Hunger Vital Sign    Worried About Running Out of Food in the Last Year: Never true    Ran Out of Food in the Last Year: Never true  Transportation Needs: No Transportation Needs (08/25/2024)   PRAPARE - Administrator, Civil Service (Medical): No    Lack of Transportation (Non-Medical): No  Physical Activity: Inactive (02/18/2024)   Exercise Vital Sign    Days of Exercise per Week: 1 day    Minutes of Exercise per Session: 0 min  Stress: Stress Concern Present (02/18/2024)    [employer] of Occupational Health - Occupational Stress Questionnaire    Feeling of Stress : To some extent  Social Connections: Socially Isolated (02/18/2024)   Social Connection and Isolation Panel    Frequency of Communication with Friends and Family: More than three times a week    Frequency of Social Gatherings with Friends and Family: More than three times a week    Attends Religious Services: Never    Database administrator or Organizations: No    Attends Banker Meetings: Never  Marital Status: Never married  Intimate Partner Violence: Not At Risk (08/25/2024)   Humiliation, Afraid, Rape, and Kick questionnaire    Fear of Current or Ex-Partner: No    Emotionally Abused: No    Physically Abused: No    Sexually Abused: No     FAMILY HISTORY Family History  Problem Relation Age of Onset   Anxiety disorder Mother    Sleep apnea Father    Diabetes Maternal Grandmother    Hypertension Maternal Grandmother    Diabetes Maternal Grandfather    Hypertension Maternal Grandfather    Diabetes Paternal Grandmother    Hypertension Paternal Grandmother    Diabetes Paternal Grandfather    Hypertension Paternal Grandfather      Review of Systems: 12 systems were reviewed and negative except per HPI  Physical Exam: Vitals:   08/29/24 0500 08/29/24 0725  BP: (!) 101/47   Pulse:    Resp: 20   Temp:  98.4 F (36.9 C)  SpO2:     No intake/output data recorded. No intake or output data in the 24 hours ending 08/29/24 0806 General: ill-appearing, no acute distress HEENT: anicteric sclera, MMM CV: normal rate, no murmurs, no edema Lungs: bilateral chest rise, normal wob Abd: soft, slightly tender and distended (on the firm side) Skin: no visible lesions or rashes Psych: alert, engaged, appropriate mood and affect Neuro: normal speech, no gross focal deficits  Dialysis access: LUE AVF +b/t  Test Results Reviewed Lab Results  Component Value Date   NA 138  08/29/2024   K 5.7 (H) 08/29/2024   CL 102 08/29/2024   CO2 17 (L) 08/29/2024   BUN 92 (H) 08/29/2024   CREATININE 20.41 (H) 08/29/2024   CALCIUM  5.7 (LL) 08/29/2024   ALBUMIN  2.6 (L) 08/29/2024   PHOS 7.9 (H) 08/25/2024    I have reviewed relevant outside healthcare records

## 2024-08-29 NOTE — Op Note (Signed)
 Patient:  Cathy [name]  DOB:  [birth year]  MRN:  [id number]   Preop Diagnosis: End-stage renal disease, bacteremia  Postop Diagnosis: Same  Procedure: Permacath removal  Surgeon: Oneil Budge, MD  Anes: None  Indications: Patient is a 43 year old black female with bacteremia possibly secondary to line infection.  Patient has an active AV fistula on the left arm.  I was asked to remove the permacath.  Procedure note: The procedure was done at bedside in ICU #1.  Using traction, the permacath was removed without difficulty.  The tip was sent for culture.  Pressure was held at the exit site as well as the left lower neck.  No hematoma was noted at the end of the procedure.  She tolerated the procedure well.  Complications: None  EBL: Minimal  Specimen: Culture tip for microbiology.

## 2024-08-29 NOTE — Plan of Care (Signed)
 This patient remains on AP-CCU as of time of writing. The patient is spontaneously alert, oriented to person only; follows commands in all four extremities. Hypoglycemic, febrile, and hypotensive overnight (see flowsheets). The patient is now on D10W for glycemic support. Multiple loose, incontinent BM's overnight as well. NPO at MN for potential TEE tomorrow (08/30/24).   Problem: Education: Goal: Knowledge of General Education information will improve Description: Including pain rating scale, medication(s)/side effects and non-pharmacologic comfort measures Outcome: Not Progressing   Problem: Health Behavior/Discharge Planning: Goal: Ability to manage health-related needs will improve Outcome: Not Progressing   Problem: Clinical Measurements: Goal: Ability to maintain clinical measurements within normal limits will improve Outcome: Not Progressing Goal: Will remain free from infection Outcome: Not Progressing Goal: Diagnostic test results will improve Outcome: Not Progressing Goal: Respiratory complications will improve Outcome: Not Progressing Goal: Cardiovascular complication will be avoided Outcome: Not Progressing   Problem: Activity: Goal: Risk for activity intolerance will decrease Outcome: Not Progressing   Problem: Nutrition: Goal: Adequate nutrition will be maintained Outcome: Not Progressing   Problem: Coping: Goal: Level of anxiety will decrease Outcome: Not Progressing   Problem: Elimination: Goal: Will not experience complications related to bowel motility Outcome: Not Progressing Goal: Will not experience complications related to urinary retention Outcome: Not Progressing   Problem: Pain Managment: Goal: General experience of comfort will improve and/or be controlled Outcome: Not Progressing   Problem: Safety: Goal: Ability to remain free from injury will improve Outcome: Not Progressing   Problem: Skin Integrity: Goal: Risk for impaired skin  integrity will decrease Outcome: Not Progressing   Problem: Education: Goal: Ability to describe self-care measures that may prevent or decrease complications (Diabetes Survival Skills Education) will improve Outcome: Not Progressing Goal: Individualized Educational Video(s) Outcome: Not Progressing   Problem: Coping: Goal: Ability to adjust to condition or change in health will improve Outcome: Not Progressing   Problem: Fluid Volume: Goal: Ability to maintain a balanced intake and output will improve Outcome: Not Progressing   Problem: Health Behavior/Discharge Planning: Goal: Ability to identify and utilize available resources and services will improve Outcome: Not Progressing Goal: Ability to manage health-related needs will improve Outcome: Not Progressing   Problem: Metabolic: Goal: Ability to maintain appropriate glucose levels will improve Outcome: Not Progressing   Problem: Nutritional: Goal: Maintenance of adequate nutrition will improve Outcome: Not Progressing Goal: Progress toward achieving an optimal weight will improve Outcome: Not Progressing   Problem: Skin Integrity: Goal: Risk for impaired skin integrity will decrease Outcome: Not Progressing   Problem: Tissue Perfusion: Goal: Adequacy of tissue perfusion will improve Outcome: Not Progressing   Problem: Education: Goal: Knowledge of disease and its progression will improve Outcome: Not Progressing Goal: Individualized Educational Video(s) Outcome: Not Progressing   Problem: Fluid Volume: Goal: Compliance with measures to maintain balanced fluid volume will improve Outcome: Not Progressing   Problem: Health Behavior/Discharge Planning: Goal: Ability to manage health-related needs will improve Outcome: Not Progressing   Problem: Nutritional: Goal: Ability to make healthy dietary choices will improve Outcome: Not Progressing   Problem: Clinical Measurements: Goal: Complications related to  the disease process, condition or treatment will be avoided or minimized Outcome: Not Progressing   Problem: Education: Goal: Ability to demonstrate management of disease process will improve Outcome: Not Progressing Goal: Ability to verbalize understanding of medication therapies will improve Outcome: Not Progressing Goal: Individualized Educational Video(s) Outcome: Not Progressing   Problem: Activity: Goal: Capacity to carry out activities will improve Outcome: Not Progressing  Problem: Cardiac: Goal: Ability to achieve and maintain adequate cardiopulmonary perfusion will improve Outcome: Not Progressing

## 2024-08-29 NOTE — Consult Note (Addendum)
 [facility]    Date of Admission:  08/28/2024     Reason for Consult: mssa bsi    Referring Provider: albertina Fairly     Lines:  Left internal jugular hd line since 02/20/24  Lue avf native since 2023   Abx: 9/29-c cefazolin   9/28 ceftriaxone         Assessment: Mssa bsi ams Esrd Liver cirrhosis (nash) HFrEF Lupus Hx PE Bipolar depression  Patient has left avf and also a hd line came in 9/28 to [facility] with dyspnea/abd distension from dialysis center, with also concern for hd line ?exit site infection pending removal, found to have mssa bacteremia  Timeline of the 2 recent hospital course: 08/26/24 She was w/u for sbp here too -- s/p lvp and cx and cell count not suggestive On 9/28 got more confused and developed fever and returned for admission  9/26 ascites fluid negative on gram stain; wbc < 130; no culture I could see 9/28 bcx 2 of 2 set mssa 9/28 repeat ascites fluid cx so far negative; wbc 27   Nephrology mention concern for line infection pending removal now for a while    Plan: Probably line related Agree with line removal today if possible - please send the line tip to micro for tip culture Please repeat blood culture after line is removed to document clearance  Will need to r/o endocarditis -- tte/tee Continue cefazolin  for now Please investigate (mri) for any joint/mid back tenderness to r/o metastatic involvement from mssa Maintain standard isolation precaution Discussed with renal/cards/triad    I spent more than 20 minute reviewing data/chart, and coordinating care, providing diagnostics/treatment plan with treatment team   ------------------------------------------------ Principal Problem:   End-stage renal disease on hemodialysis (HCC) Active Problems:   MDD (major depressive disorder)   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 female esrd with concern for left internal jugular hd line infection but not yet removed, admitted 9/28 for sepsis/ams found to have mssa bsi   9/26-9/28 admission dyspnea/ascites. Lvp analysis no sign of sbp  Febrile on 9/28 admission Bcx mssa Nephrology following, planning line removal On abx narrowed to cefazolin   Chart mention no focal tenderness  Mild acute on chronic thrombocytopenia Cirrhosis known; nash   Ct abd pelv with large ascites -- tapped again and sbp r/o 9/26 has ascites analysis too no sbp at that time as well   Family History  Problem Relation Age of Onset   Anxiety disorder Mother    Sleep apnea Father    Diabetes Maternal Grandmother    Hypertension Maternal Grandmother    Diabetes Maternal Grandfather    Hypertension Maternal Grandfather    Diabetes Paternal Grandmother    Hypertension Paternal Grandmother    Diabetes Paternal Grandfather    Hypertension Paternal Grandfather     Social History   Tobacco Use   Smoking status: Every Day    Current packs/day: 0.25    Average packs/day: 0.3 packs/day for 25.7 years (6.4 ttl pk-yrs)    Types: Cigarettes    Start date: 2000   Smokeless tobacco: Never   Tobacco comments:    1-2 cigarettes daily   Vaping Use  Vaping status: Never Used  Substance Use Topics   Alcohol use: Not Currently   Drug use: Yes    Frequency: 3.0 times per week    Types: Marijuana    Allergies  Allergen Reactions   Dialyvite 800 [Nephro-Vite] Nausea And Vomiting    Review of Systems: ROS All Other ROS was negative, except mentioned above   Past Medical History:  Diagnosis Date   Anasarca associated with disorder of kidney 06/08/2021   Anxiety    Asthma    Bipolar depression (HCC)    Cirrhosis (HCC)    Depression     Dyspnea    ESRD (end stage renal disease) on dialysis (HCC) 10/2020   GERD (gastroesophageal reflux disease)    Gonorrhea    Lupus    Pelvic inflammatory disease (PID)    Pleural effusion 06/08/2021   Pulmonary embolism (HCC) 01/31/2022   Renal hypertension    Trichomonas infection        Scheduled Meds:  aspirin   81 mg Oral QHS   busPIRone   7.5 mg Oral BID   Chlorhexidine  Gluconate Cloth  6 each Topical Q0600   cloNIDine   0.3 mg Oral BID   gabapentin   300 mg Oral QHS   heparin   5,000 Units Subcutaneous Q8H   levETIRAcetam   500 mg Oral Daily   And   [START ON 08/30/2024] levETIRAcetam   250 mg Oral Q T,Th,Sa-HD   pantoprazole   40 mg Oral QAC breakfast   Continuous Infusions:  anticoagulant sodium citrate       ceFAZolin  (ANCEF ) IV     PRN Meds:.acetaminophen  **OR** acetaminophen , albuterol , alteplase , anticoagulant sodium citrate , heparin , heparin , hydrOXYzine , lidocaine  (PF), lidocaine -prilocaine , pentafluoroprop-tetrafluoroeth   OBJECTIVE: Blood pressure 129/75, pulse 66, temperature 99.2 F (37.3 C), temperature source Oral, resp. rate 20, height 5' 7 (1.702 m), weight 81.5 kg, last menstrual period 04/07/2020, SpO2 100%.  Physical Exam  Chart reviewed  Lab Results Lab Results  Component Value Date   WBC 10.7 (H) 08/28/2024   HGB 9.0 (L) 08/28/2024   HCT 28.2 (L) 08/28/2024   MCV 91.9 08/28/2024   PLT 71 (L) 08/28/2024    Lab Results  Component Value Date   CREATININE 20.41 (H) 08/29/2024   BUN 92 (H) 08/29/2024   NA 138 08/29/2024   K 5.7 (H) 08/29/2024   CL 102 08/29/2024   CO2 17 (L) 08/29/2024    Lab Results  Component Value Date   ALT 26 08/29/2024   AST 73 (H) 08/29/2024   ALKPHOS 148 (H) 08/29/2024   BILITOT 1.1 08/29/2024      Microbiology: Recent Results (from the past 240 hours)  Gram stain     Status: None   Collection Time: 08/26/24 11:43 AM   Specimen: Peritoneal Washings; Peritoneal Fluid  Result Value Ref Range Status    Specimen Description PERITONEAL  Final   Special Requests NONE  Final   Gram Stain   Final    CYTOSPIN SMEAR ASCITIC WBC PRESENT, PREDOMINANTLY MONONUCLEAR NO ORGANISMS SEEN Performed at [facility], [address], [place], [place] [address]    Report Status 08/26/2024 FINAL  Final  Culture, blood (routine x 2)     Status: None (Preliminary result)   Collection Time: 08/28/24  8:43 PM   Specimen: BLOOD  Result Value Ref Range Status   Specimen Description   Final    BLOOD RIGHT ANTECUBITAL Performed at [facility], [address] [place], [place] [address]    Special Requests   Final  BOTTLES DRAWN AEROBIC AND ANAEROBIC Blood Culture adequate volume Performed at [facility], [address], [place], [place] [address]    Culture  Setup Time   Final    GRAM POSITIVE COCCI IN BOTH AEROBIC AND ANAEROBIC BOTTLES Gram Stain Report Called to,Read Back By and Verified With: WOODEN C AT 0627 ON [id number] BY PURDIE J CRITICAL RESULT CALLED TO, READ BACK BY AND VERIFIED WITH: PHARMD LINDA LONG [id number] FCP Performed at [facility], [address], [place], [place] [address]    Culture GRAM POSITIVE COCCI  Final   Report Status PENDING  Incomplete  Resp panel by RT-PCR (RSV, Flu A&B, Covid) Anterior Nasal Swab     Status: None   Collection Time: 08/28/24  8:43 PM   Specimen: Anterior Nasal Swab  Result Value Ref Range Status   SARS Coronavirus 2 by RT PCR NEGATIVE NEGATIVE Final    Comment: (NOTE) SARS-CoV-2 target nucleic acids are NOT DETECTED.  The SARS-CoV-2 RNA is generally detectable in upper respiratory specimens during the acute phase of infection. The lowest concentration of SARS-CoV-2 viral copies this assay can detect is 138 copies/mL. A negative result does not preclude SARS-Cov-2 infection and should not be used as the sole basis for treatment or other patient management decisions. A negative result may occur with  improper specimen  collection/handling, submission of specimen other than nasopharyngeal swab, presence of viral mutation(s) within the areas targeted by this assay, and inadequate number of viral copies(<138 copies/mL). A negative result must be combined with clinical observations, patient history, and epidemiological information. The expected result is Negative.  Fact Sheet for Patients:  BloggerCourse.com  Fact Sheet for Healthcare Providers:  SeriousBroker.it  This test is no t yet approved or cleared by the [place]  FDA and  has been authorized for detection and/or diagnosis of SARS-CoV-2 by FDA under an Emergency Use Authorization (EUA). This EUA will remain  in effect (meaning this test can be used) for the duration of the COVID-19 declaration under Section 564(b)(1) of the Act, 21 U.S.C.section 360bbb-3(b)(1), unless the authorization is terminated  or revoked sooner.       Influenza A by PCR NEGATIVE NEGATIVE Final   Influenza B by PCR NEGATIVE NEGATIVE Final    Comment: (NOTE) The Xpert Xpress SARS-CoV-2/FLU/RSV plus assay is intended as an aid in the diagnosis of influenza from Nasopharyngeal swab specimens and should not be used as a sole basis for treatment. Nasal washings and aspirates are unacceptable for Xpert Xpress SARS-CoV-2/FLU/RSV testing.  Fact Sheet for Patients: BloggerCourse.com  Fact Sheet for Healthcare Providers: SeriousBroker.it  This test is not yet approved or cleared by the [place]  FDA and has been authorized for detection and/or diagnosis of SARS-CoV-2 by FDA under an Emergency Use Authorization (EUA). This EUA will remain in effect (meaning this test can be used) for the duration of the COVID-19 declaration under Section 564(b)(1) of the Act, 21 U.S.C. section 360bbb-3(b)(1), unless the authorization is terminated or revoked.     Resp Syncytial  Virus by PCR NEGATIVE NEGATIVE Final    Comment: (NOTE) Fact Sheet for Patients: BloggerCourse.com  Fact Sheet for Healthcare Providers: SeriousBroker.it  This test is not yet approved or cleared by the [place]  FDA and has been authorized for detection and/or diagnosis of SARS-CoV-2 by FDA under an Emergency Use Authorization (EUA). This EUA will remain in effect (meaning this test can be used) for the duration of the COVID-19 declaration under Section 564(b)(1) of the  Act, 21 U.S.C. section 360bbb-3(b)(1), unless the authorization is terminated or revoked.  Performed at [facility], [address], [place], [place] [address]   Blood Culture ID Panel (Reflexed)     Status: Abnormal   Collection Time: 08/28/24  8:43 PM  Result Value Ref Range Status   Enterococcus faecalis NOT DETECTED NOT DETECTED Final   Enterococcus Faecium NOT DETECTED NOT DETECTED Final   Listeria monocytogenes NOT DETECTED NOT DETECTED Final   Staphylococcus species DETECTED (A) NOT DETECTED Final    Comment: CRITICAL RESULT CALLED TO, READ BACK BY AND VERIFIED WITH: PHARMD LINDA LONG [id number] FCP    Staphylococcus aureus (BCID) DETECTED (A) NOT DETECTED Final    Comment: CRITICAL RESULT CALLED TO, READ BACK BY AND VERIFIED WITH: PHARMD LINDA LONG [id number] FCP    Staphylococcus epidermidis NOT DETECTED NOT DETECTED Final   Staphylococcus lugdunensis NOT DETECTED NOT DETECTED Final   Streptococcus species NOT DETECTED NOT DETECTED Final   Streptococcus agalactiae NOT DETECTED NOT DETECTED Final   Streptococcus pneumoniae NOT DETECTED NOT DETECTED Final   Streptococcus pyogenes NOT DETECTED NOT DETECTED Final   A.calcoaceticus-baumannii NOT DETECTED NOT DETECTED Final   Bacteroides fragilis NOT DETECTED NOT DETECTED Final   Enterobacterales NOT DETECTED NOT DETECTED Final   Enterobacter cloacae complex NOT DETECTED NOT DETECTED Final    Escherichia coli NOT DETECTED NOT DETECTED Final   Klebsiella aerogenes NOT DETECTED NOT DETECTED Final   Klebsiella oxytoca NOT DETECTED NOT DETECTED Final   Klebsiella pneumoniae NOT DETECTED NOT DETECTED Final   Proteus species NOT DETECTED NOT DETECTED Final   Salmonella species NOT DETECTED NOT DETECTED Final   Serratia marcescens NOT DETECTED NOT DETECTED Final   Haemophilus influenzae NOT DETECTED NOT DETECTED Final   Neisseria meningitidis NOT DETECTED NOT DETECTED Final   Pseudomonas aeruginosa NOT DETECTED NOT DETECTED Final   Stenotrophomonas maltophilia NOT DETECTED NOT DETECTED Final   Candida albicans NOT DETECTED NOT DETECTED Final   Candida auris NOT DETECTED NOT DETECTED Final   Candida glabrata NOT DETECTED NOT DETECTED Final   Candida krusei NOT DETECTED NOT DETECTED Final   Candida parapsilosis NOT DETECTED NOT DETECTED Final   Candida tropicalis NOT DETECTED NOT DETECTED Final   Cryptococcus neoformans/gattii NOT DETECTED NOT DETECTED Final   Meth resistant mecA/C and MREJ NOT DETECTED NOT DETECTED Final    Comment: Performed at [facility], [address], [place], [place] [address]  Culture, blood (routine x 2)     Status: None (Preliminary result)   Collection Time: 08/28/24  9:50 PM   Specimen: BLOOD RIGHT HAND  Result Value Ref Range Status   Specimen Description BLOOD RIGHT HAND  Final   Special Requests   Final    BOTTLES DRAWN AEROBIC AND ANAEROBIC Blood Culture adequate volume   Culture  Setup Time   Final    GRAM POSITIVE COCCI IN BOTH AEROBIC AND ANAEROBIC BOTTLES Gram Stain Report Called to,Read Back By and Verified With: G ROOS AT 0727 ON [id number] BY S DALTON Performed at [facility], [address], [place], [place] [address]    Culture [facility] POSITIVE COCCI  Final   Report Status PENDING  Incomplete  Body fluid culture w Gram Stain     Status: None (Preliminary result)   Collection Time: 08/28/24 10:39 PM   Specimen: Peritoneal Washings;  Body Fluid  Result Value Ref Range Status   Specimen Description PERITONEAL  Final   Special Requests NONE  Final  Gram Stain   Final    NO ORGANISMS SEEN WBC PRESENT, PREDOMINANTLY PMN CYTOSPIN SMEAR Performed at [facility], [address] [place], [place] [address]    Culture PENDING  Incomplete   Report Status PENDING  Incomplete  MRSA Next Gen by PCR, Nasal     Status: None   Collection Time: 08/29/24  1:04 AM   Specimen: Nasal Mucosa; Nasal Swab  Result Value Ref Range Status   MRSA by PCR Next Gen NOT DETECTED NOT DETECTED Final    Comment: (NOTE) The GeneXpert MRSA Assay (FDA approved for NASAL specimens only), is one component of a comprehensive MRSA colonization surveillance program. It is not intended to diagnose MRSA infection nor to guide or monitor treatment for MRSA infections. Test performance is not FDA approved in patients less than 34 years old. Performed at [facility], [address], [place], [place] [address]      Serology:    Imaging: If present, new imagings (plain films, ct scans, and mri) have been personally visualized and interpreted; radiology reports have been reviewed. Decision making incorporated into the Impression / Recommendations.  9/28 ct abd pelv Cardiomegaly, coronary artery disease.  Aortic atherosclerosis.   Small loculated right pleural effusion posteriorly and in the right major fissure.   Large volume ascites, increasing since prior study.   Constance ONEIDA Passer, MD [facility] [phone] pager    08/29/2024, 12:11 PM

## 2024-08-29 NOTE — Progress Notes (Signed)
 Date and time results received: 08/29/24 0728 (use smartphrase .now to insert current time)  Test: Blood Cultures Critical Value: Both bottles positive for gram positive cocci  Name of Provider Notified: Dr. Maree  Orders Received? Or Actions Taken?: MD and primary nurse aware. Awaiting orders.

## 2024-08-29 NOTE — Progress Notes (Signed)
 PROGRESS NOTE    Cathy [name]  FMW:[id number] DOB: 22-Nov-1981 DOA: 08/28/2024 PCP: Terry Wilhelmena Lloyd Hilario, FNP   Brief Narrative:    Cathy [name] is a 43 y.o. female with medical history significant of ESRD on hemodialysis, Tuesday, Thursday and Saturday, cirrhosis, depression, lupus, history of pulmonary embolism and bipolar/ depression with presented with altered mental status.  Patient was found down by her family. Unknown for how long patient was on the floor. EMS was called, she wad found confused and febrile, then she was transported to the ED. she was recently discharged on 9/26 after hospitalization for volume overload with hyperkalemia and acidosis and underwent hemodialysis as well as large-volume paracentesis.  She has now been admitted with concerns for acute metabolic encephalopathy secondary to severe sepsis with concerns for SBP and is now noted to have some gram-positive cocci bacteremia as well.  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  Initial concern for SBP, however ascitic fluid analysis does not appear concerning and Gram stain negative with cultures pending Antibiotic therapy with IV ceftriaxone  2 g daily MRSA PCR negative Follow up cell count, temperature curve and cultures.  Admit to step down unit for close monitoring.  Neuro checks q 4 hrs  No need for paracentesis at this  time  End-stage renal disease on hemodialysis (HCC) with hyperkalemia Patient with azotemia and likely component of uremia.  Hyperkalemia and anion gap metabolic acidosis.  Hypocalcemia-calcium  gluconate ordered Appreciate nephrology with plans for dialysis   Hypertension-with soft blood pressure readings Due to risk of hypotension hold on losartan , hydralazine , amlodipine  and carvedilol   Will continue clonidine  to avoid rebound hypertension. Continue close blood pressure monitoring    Chronic systolic CHF (congestive heart failure) (HCC) Volume overload due to renal failure with ESRD, not compliant with hemodialysis and ultrafiltration Last echocardiogram with reduced LV systolic function Continue blood pressure monitoring    Cirrhosis of liver with ascites (HCC) Follow up with ascitic fluid analysis. Patient will likely need repeat therapeutic paracentesis.  Consult IR    Hypoglycemia Positive hypoglycemia, likely due to poor oral intake, low glycogen storages and reduced GFR.  Add hypoglycemia protocol Clear liquid diet for now.  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    DVT prophylaxis:Heparin  Code Status: Full Family Communication: None at bedside Disposition Plan:  Status is: Inpatient Remains inpatient appropriate because: Need for IV medications   Consultants:  Nephrology  Procedures:  None  Antimicrobials:  Anti-infectives (From admission, onward)    Start     Dose/Rate Route Frequency Ordered Stop   08/30/24 0000  cefTRIAXone  (ROCEPHIN ) 2 g in sodium chloride  0.9 % 100 mL IVPB  2 g 200 mL/hr over 30 Minutes Intravenous  Every 24 hours 08/29/24 0029     08/28/24 2245  cefTRIAXone  (ROCEPHIN ) 2 g in sodium chloride  0.9 % 100 mL IVPB        2 g 200 mL/hr over 30 Minutes Intravenous  Once 08/28/24 2234 08/29/24 0033         Subjective: Patient seen and evaluated today and is somnolent, but arousable and otherwise states that she is hurting and aches all over.  She is refusing some lab draws as well.  Objective: Vitals:   08/29/24 0344 08/29/24 0400 08/29/24 0500 08/29/24 0725  BP: 121/69 (!) 106/44 (!) 101/47   Pulse:  66    Resp: (!) 22 19 20    Temp:  97.9 F (36.6 C)  98.4 F (36.9 C)  TempSrc:  Axillary  Oral  SpO2:  100%    Weight:      Height:       No intake or output data in the 24 hours ending 08/29/24 0734 Filed Weights   08/28/24 1927 08/29/24 0300  Weight: 85 kg 81.5 kg    Examination:  General exam: Appears somnolent but arousable Respiratory system: Clear to auscultation. Respiratory effort normal.  Nasal cannula. Cardiovascular system: S1 & S2 heard, RRR.  Gastrointestinal system: Abdomen is soft, distended and tender to palpation but not tense Central nervous system: Alert and awake Extremities: No edema Skin: No significant lesions noted Psychiatry: Flat affect.    Data Reviewed: I have personally reviewed following labs and imaging studies  CBC: Recent Labs  Lab 08/24/24 2240 08/25/24 0340 08/28/24 2043  WBC 5.2 4.8 10.7*  NEUTROABS 4.0  --  9.8*  HGB 8.4* 7.4* 9.0*  HCT 26.5* 24.5* 28.2*  MCV 95.7 96.5 91.9  PLT 87* 79* 71*   Basic Metabolic Panel: Recent Labs  Lab 08/24/24 2240 08/25/24 0340 08/28/24 2043 08/29/24 0349  NA 142 141 140 138  K 5.4* 5.1 6.1* 5.7*  CL 101 100 100 102  CO2 21* 22 17* 17*  GLUCOSE 86 99 64* 149*  BUN 64* 63* 91* 92*  CREATININE 16.29* 16.42* 20.60* 20.41*  CALCIUM  6.9* 6.8* 6.4* 5.7*  MG  --  2.0  --   --   PHOS  --  7.9*  --   --    GFR: Estimated Creatinine Clearance: 4 mL/min (A) (by C-G formula based on SCr  of 20.41 mg/dL (H)). Liver Function Tests: Recent Labs  Lab 08/24/24 2240 08/28/24 2043 08/29/24 0349  AST 26 61* 73*  ALT 14 21 26   ALKPHOS 161* 180* 148*  BILITOT 0.7 1.5* 1.1  PROT 6.7 5.6* 5.1*  ALBUMIN  3.2* 2.9* 2.6*   Recent Labs  Lab 08/28/24 2043  LIPASE 25   Recent Labs  Lab 08/28/24 2043  AMMONIA 24   Coagulation Profile: Recent Labs  Lab 08/25/24 0340 08/28/24 2043  INR 1.2 1.4*   Cardiac Enzymes: No results for input(s): CKTOTAL, CKMB, CKMBINDEX, TROPONINI in the last 168 hours. BNP (last 3 results) No results for input(s): PROBNP in the last 8760 hours. HbA1C: No results for input(s): HGBA1C in the last 72 hours. CBG: Recent Labs  Lab 08/29/24 0127 08/29/24 0336 08/29/24 0404 08/29/24 0651 08/29/24 0725  GLUCAP 79 55* 143* 53* 102*   Lipid Profile: No results for input(s): CHOL, HDL, LDLCALC, TRIG, CHOLHDL, LDLDIRECT in the last 72 hours. Thyroid  Function Tests: Recent Labs    08/28/24 2043  TSH 2.303   Anemia Panel: No  results for input(s): VITAMINB12, FOLATE, FERRITIN, TIBC, IRON , RETICCTPCT in the last 72 hours. Sepsis Labs: Recent Labs  Lab 08/28/24 2043  LATICACIDVEN 1.4    Recent Results (from the past 240 hours)  Gram stain     Status: None   Collection Time: 08/26/24 11:43 AM   Specimen: Peritoneal Washings; Peritoneal Fluid  Result Value Ref Range Status   Specimen Description PERITONEAL  Final   Special Requests NONE  Final   Gram Stain   Final    CYTOSPIN SMEAR ASCITIC WBC PRESENT, PREDOMINANTLY MONONUCLEAR NO ORGANISMS SEEN Performed at [facility], [address], [place], [place] [address]    Report Status 08/26/2024 FINAL  Final  Culture, blood (routine x 2)     Status: None (Preliminary result)   Collection Time: 08/28/24  8:43 PM   Specimen: Right Antecubital; Blood  Result Value Ref Range Status   Specimen Description RIGHT ANTECUBITAL  Final   Special Requests    Final    BOTTLES DRAWN AEROBIC AND ANAEROBIC Blood Culture adequate volume   Culture   Final    NO GROWTH < 12 HOURS Performed at [facility], [address] [place], [place] [address]    Report Status PENDING  Incomplete  Resp panel by RT-PCR (RSV, Flu A&B, Covid) Anterior Nasal Swab     Status: None   Collection Time: 08/28/24  8:43 PM   Specimen: Anterior Nasal Swab  Result Value Ref Range Status   SARS Coronavirus 2 by RT PCR NEGATIVE NEGATIVE Final    Comment: (NOTE) SARS-CoV-2 target nucleic acids are NOT DETECTED.  The SARS-CoV-2 RNA is generally detectable in upper respiratory specimens during the acute phase of infection. The lowest concentration of SARS-CoV-2 viral copies this assay can detect is 138 copies/mL. A negative result does not preclude SARS-Cov-2 infection and should not be used as the sole basis for treatment or other patient management decisions. A negative result may occur with  improper specimen collection/handling, submission of specimen other than nasopharyngeal swab, presence of viral mutation(s) within the areas targeted by this assay, and inadequate number of viral copies(<138 copies/mL). A negative result must be combined with clinical observations, patient history, and epidemiological information. The expected result is Negative.  Fact Sheet for Patients:  BloggerCourse.com  Fact Sheet for Healthcare Providers:  SeriousBroker.it  This test is no t yet approved or cleared by the [place]  FDA and  has been authorized for detection and/or diagnosis of SARS-CoV-2 by FDA under an Emergency Use Authorization (EUA). This EUA will remain  in effect (meaning this test can be used) for the duration of the COVID-19 declaration under Section 564(b)(1) of the Act, 21 U.S.C.section 360bbb-3(b)(1), unless the authorization is terminated  or revoked sooner.       Influenza A by PCR NEGATIVE NEGATIVE  Final   Influenza B by PCR NEGATIVE NEGATIVE Final    Comment: (NOTE) The Xpert Xpress SARS-CoV-2/FLU/RSV plus assay is intended as an aid in the diagnosis of influenza from Nasopharyngeal swab specimens and should not be used as a sole basis for treatment. Nasal washings and aspirates are unacceptable for Xpert Xpress SARS-CoV-2/FLU/RSV testing.  Fact Sheet for Patients: BloggerCourse.com  Fact Sheet for Healthcare Providers: SeriousBroker.it  This test is not yet approved or cleared by the [place]  FDA and has been authorized for detection and/or diagnosis of SARS-CoV-2 by FDA under an Emergency Use Authorization (EUA). This EUA will remain in effect (meaning this test can be used) for the duration  of the COVID-19 declaration under Section 564(b)(1) of the Act, 21 U.S.C. section 360bbb-3(b)(1), unless the authorization is terminated or revoked.     Resp Syncytial Virus by PCR NEGATIVE NEGATIVE Final    Comment: (NOTE) Fact Sheet for Patients: BloggerCourse.com  Fact Sheet for Healthcare Providers: SeriousBroker.it  This test is not yet approved or cleared by the [place]  FDA and has been authorized for detection and/or diagnosis of SARS-CoV-2 by FDA under an Emergency Use Authorization (EUA). This EUA will remain in effect (meaning this test can be used) for the duration of the COVID-19 declaration under Section 564(b)(1) of the Act, 21 U.S.C. section 360bbb-3(b)(1), unless the authorization is terminated or revoked.  Performed at [facility], [address] [place], [place] [address]   Culture, blood (routine x 2)     Status: None (Preliminary result)   Collection Time: 08/28/24  9:50 PM   Specimen: BLOOD RIGHT HAND  Result Value Ref Range Status   Specimen Description BLOOD RIGHT HAND  Final   Special Requests   Final    BOTTLES DRAWN AEROBIC AND ANAEROBIC  Blood Culture adequate volume   Culture  Setup Time   Final    GRAM POSITIVE COCCI IN BOTH AEROBIC AND ANAEROBIC BOTTLES Gram Stain Report Called to,Read Back By and Verified With: G ROOS AT 0727 ON [id number] BY S DALTON Performed at [facility], [address], [place], [place] [address]    Culture [facility] POSITIVE COCCI  Final   Report Status PENDING  Incomplete  Body fluid culture w Gram Stain     Status: None (Preliminary result)   Collection Time: 08/28/24 10:39 PM   Specimen: Peritoneal Washings; Body Fluid  Result Value Ref Range Status   Specimen Description PERITONEAL  Final   Special Requests NONE  Final   Gram Stain   Final    NO ORGANISMS SEEN WBC PRESENT, PREDOMINANTLY PMN CYTOSPIN SMEAR Performed at [facility], [address], [place], [place] [address]    Culture PENDING  Incomplete   Report Status PENDING  Incomplete  MRSA Next Gen by PCR, Nasal     Status: None   Collection Time: 08/29/24  1:04 AM   Specimen: Nasal Mucosa; Nasal Swab  Result Value Ref Range Status   MRSA by PCR Next Gen NOT DETECTED NOT DETECTED Final    Comment: (NOTE) The GeneXpert MRSA Assay (FDA approved for NASAL specimens only), is one component of a comprehensive MRSA colonization surveillance program. It is not intended to diagnose MRSA infection nor to guide or monitor treatment for MRSA infections. Test performance is not FDA approved in patients less than 12 years old. Performed at [facility], [address], [place], [place] [address]          Radiology Studies: CT Head Wo Contrast Result Date: 08/28/2024 CLINICAL DATA:  Mental status changes. EXAM: CT HEAD WITHOUT CONTRAST TECHNIQUE: Contiguous axial images were obtained from the base of the skull through the vertex without intravenous contrast. RADIATION DOSE REDUCTION: This exam was performed according to the departmental dose-optimization program which includes automated exposure control, adjustment of the mA and/or kV  according to patient size and/or use of iterative reconstruction technique. COMPARISON:  05/31/2024 FINDINGS: Brain: No acute intracranial abnormality. Specifically, no hemorrhage, hydrocephalus, mass lesion, acute infarction, or significant intracranial injury. Vascular: No hyperdense vessel or unexpected calcification. Skull: No acute calvarial abnormality. Sinuses/Orbits: No acute findings. Mucosal thickening in the ethmoid air cells and maxillary sinuses. Other: None IMPRESSION: No acute  intracranial abnormality. Chronic sinusitis. Electronically Signed   By: Franky Crease M.D.   On: 08/28/2024 21:37   CT CHEST ABDOMEN PELVIS W CONTRAST Result Date: 08/28/2024 CLINICAL DATA:  Sepsis suspected.  Found on floor. EXAM: CT CHEST, ABDOMEN, AND PELVIS WITH CONTRAST TECHNIQUE: Multidetector CT imaging of the chest, abdomen and pelvis was performed following the standard protocol during bolus administration of intravenous contrast. RADIATION DOSE REDUCTION: This exam was performed according to the departmental dose-optimization program which includes automated exposure control, adjustment of the mA and/or kV according to patient size and/or use of iterative reconstruction technique. CONTRAST:  OMNIPAQUE  IOHEXOL  300 MG/ML  SOLN COMPARISON:  02/01/2024 FINDINGS: CT CHEST FINDINGS Cardiovascular: Cardiomegaly. Scattered coronary artery calcifications. Dialysis catheter tip in the right atrium. Aorta normal caliber. Mediastinum/Nodes: No mediastinal, hilar, or axillary adenopathy. Trachea and esophagus are unremarkable. Thyroid  unremarkable. Lungs/Pleura: Small right pleural effusion loculated superiorly and posteriorly and within the major fissure. No confluent airspace opacities or overt edema. Musculoskeletal: Chest wall soft tissues are unremarkable. No acute bony abnormality. CT ABDOMEN PELVIS FINDINGS Hepatobiliary: No focal hepatic abnormality. Gallbladder unremarkable. Pancreas: No focal abnormality or  ductal dilatation. Spleen: No focal abnormality.  Normal size. Adrenals/Urinary Tract: Kidneys are atrophic. No renal or adrenal mass. No hydronephrosis. Urinary bladder not visualized. Stomach/Bowel: Normal appendix. Stomach, large and small bowel grossly unremarkable. Vascular/Lymphatic: No evidence of aneurysm or adenopathy. Diffuse aortic atherosclerosis. Reproductive: Uterus and adnexa unremarkable.  No mass. Other: Large volume ascites, slightly increased since prior study. Musculoskeletal: No acute bony abnormality. IMPRESSION: Cardiomegaly, coronary artery disease.  Aortic atherosclerosis. Small loculated right pleural effusion posteriorly and in the right major fissure. Large volume ascites, increasing since prior study. Electronically Signed   By: Franky Crease M.D.   On: 08/28/2024 21:34        Scheduled Meds:  aspirin   81 mg Oral QHS   atorvastatin   40 mg Oral Daily   busPIRone   7.5 mg Oral BID   Chlorhexidine  Gluconate Cloth  6 each Topical Q0600   cloNIDine   0.3 mg Oral BID   gabapentin   300 mg Oral QHS   heparin   5,000 Units Subcutaneous Q8H   levETIRAcetam   500 mg Oral Daily   And   [START ON 08/30/2024] levETIRAcetam   250 mg Oral Q T,Th,Sa-HD   pantoprazole   40 mg Oral QAC breakfast   Continuous Infusions:  [START ON 08/30/2024] cefTRIAXone  (ROCEPHIN )  IV       LOS: 1 day    Time spent: 55 minutes    Lucan Riner D Nyriah Coote, DO [facility]  If 7PM-7AM, please contact night-coverage [email or web address] 08/29/2024, 7:34 AM

## 2024-08-29 NOTE — Progress Notes (Signed)
 Pt receives out-pt HD at [facility], as of 9/25 she is on a TTS schedule, 1015 chair time. Clinic states that pt has been informed of this schedule change and did not show to dialysis on Saturday.    Per note on the 26th, I was Contacted by Larue Chester. They mentioned that they have made this pt 3-4 appts to get her left chest CVC removed, due to infection concerns for awhile now. pt did not show to any of these appts and is not welcomed back to [facility] due to these no-shows. They are wondering if at all possible, could it be removed here during this admission.   Nephrologist was notified Friday the 26th, nephrologist has been notified of this as well now on 9/29.   Will continue to assist as needed.    Miliana Gangwer Dialysis Navigator [phone]

## 2024-08-29 NOTE — TOC Initial Note (Signed)
 Transition of Care [facility]) - Initial/Assessment Note    Patient Details  Name: Cathy [name] MRN: [id number] Date of Birth: [birth year]  Transition [facility]) CM/SW Contact:    Sharlyne Stabs, RN Phone Number: 08/29/2024, 4:07 PM  Clinical Narrative:     TOC at the beside to assess. RN at the bedside, patient screaming. Radiology team waiting to take patient for a Para. TOC following. Patient has a recent assessment in the chart.               Expected Discharge Plan: Home w Home Health Services Barriers to Discharge: Continued Medical Work up   Patient Goals and CMS Choice Patient states their goals for this hospitalization and ongoing recovery are:: Return home        Activities of Daily Living     Admission diagnosis:  Hyperkalemia [E87.5] Hypoglycemia [E16.2] SBP (spontaneous bacterial peritonitis) (HCC) [K65.2] Sepsis, due to unspecified organism, unspecified whether acute organ dysfunction present [facility]) [A41.9] Patient Active Problem List   Diagnosis Date Noted   SBP (spontaneous bacterial peritonitis) (HCC) 08/28/2024   Hypoglycemia 08/28/2024   Neuropathy 08/25/2024   Dental abscess 06/16/2024   Difficulty sleeping 06/16/2024   Acute metabolic encephalopathy 05/31/2024   Hyperammonemia 05/31/2024   NASH (nonalcoholic steatohepatitis) 05/31/2024   Abnormal pleural fluid 05/31/2024   Prolonged QT interval 05/31/2024   Cellulitis 02/20/2024   Chronic migraine w/o aura w/o status migrainosus, not intractable 12/18/2023   Vaginal discharge 12/18/2023   Elevated ferritin 10/19/2023   SLE (systemic lupus erythematosus related syndrome) (HCC) 10/19/2023   Hypertension 08/27/2023   Dyspnea 08/27/2023   Acute on chronic clinical systolic heart failure (HCC) 10/27/2022   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 cor pulmonale (HCC) 10/27/2022   Gastroesophageal reflux disease    Dysphagia    Abnormal MRI, liver    Cirrhosis of liver with ascites (HCC)    Acute  hyperkalemia 06/06/2022   Abdominal pain 03/17/2022   Hypothyroidism, unspecified 03/05/2022   Tense ascites 03/02/2022   Volume overload 03/01/2022   Tobacco use 01/29/2022   Pulmonary embolism (HCC) 01/09/2022   History of pulmonary embolus 01/07/2022   Malignant hypertension 01/06/2022   Anemia due to chronic kidney disease 12/28/2021   Chronic systolic CHF (congestive heart failure) (HCC) 12/27/2021   Sepsis (HCC) 12/13/2021   Benign essential HTN 06/08/2021   HFrEF (heart failure with reduced ejection fraction) (HCC)    Nephrotic syndrome with focal and segmental glomerular lesions 03/12/2021   Encounter for antineoplastic chemotherapy 01/07/2021   End-stage renal disease on hemodialysis (HCC) 12/23/2020   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   Anxiety and depression 12/14/2020   Iron  deficiency anemia, unspecified 12/14/2020   Systemic lupus erythematosus, unspecified (HCC) 12/14/2020   Secondary hyperparathyroidism of renal origin 12/14/2020   Pericardial effusion-chronic     Interstitial edema 11/07/2020   Pelvic adhesive disease 02/03/2019   Renal hypertension 02/02/2019   Non compliance w medication regimen 01/12/2019   Anemia 10/12/2018   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) 09/20/2018   MDD (major depressive disorder) 10/11/2015   Asthma 09/22/2011   PCP:  Terry Wilhelmena Lloyd Hilario, FNP Pharmacy:   [facility] - [place], [place] - [address] [place] [address] Phone: [phone] Fax: [phone]  [facility] - [place], [place] - [address] [place] [address] Phone: [phone] Fax: [phone]  [id number] - , [place] - [address] AT [facility] [phone] [place] [address] Phone: [phone] Fax: [phone]  [facility] [address] [place] [address] Phone: [phone] Fax:  [phone]     Social Drivers of Health (SDOH) Social History: SDOH Screenings   Food Insecurity: No Food Insecurity (08/25/2024)  Housing: Low Risk  (08/25/2024)  Transportation Needs: No Transportation Needs (08/25/2024)  Utilities: Not At Risk (08/25/2024)  Alcohol Screen: Low Risk  (02/18/2024)  Depression (PHQ2-9): High Risk (06/16/2024)  Financial Resource Strain: Low Risk  (02/18/2024)  Physical Activity: Inactive (02/18/2024)  Social Connections: Socially Isolated (02/18/2024)  Stress: Stress Concern Present (02/18/2024)  Tobacco Use: High Risk (08/29/2024)   SDOH Interventions:     Readmission Risk Interventions    08/29/2024    4:06 PM 05/31/2024    1:09 PM 08/11/2022   12:50 PM  Readmission Risk Prevention Plan  Transportation Screening Complete Complete Complete  Medication Review Oceanographer) Complete Complete Complete  PCP or Specialist appointment within 3-5 days of discharge  Not Complete   HRI or Home Care Consult Not Complete Complete Complete  SW Recovery Care/Counseling Consult Not Complete Complete Complete  Palliative Care Screening Not Applicable Not Applicable Not Applicable  Skilled Nursing Facility Not Applicable Not Applicable Not Applicable

## 2024-08-29 NOTE — Progress Notes (Signed)
 Hypoglycemic Event  CBG: 68 mg/dL  Treatment: D50 25 mL (12.5 gm)  Symptoms: Shaky  Follow-up CBG: Time: 2025 hours CBG Result: 114 mg/dL  Possible Reasons for Event: Other: Medical conditions  Comments/MD notified:Dr. Segars   Annalysse Shoemaker S Dino Borntreger, RN

## 2024-08-29 NOTE — Progress Notes (Signed)
*  PRELIMINARY RESULTS* Echocardiogram 2D Echocardiogram has been performed.  [name] Aida PARAS 08/29/2024, 5:02 PM

## 2024-08-29 NOTE — Progress Notes (Signed)
 Patient admitted overnight with sepsis/AMS. She is s/p diagnostic and therapeutic in IR on 9/26 which yielded 5.6 L peritoneal fluid, labs without concern for SBP. Diagnostic paracentesis was performed in the ED overnight - so far no evidence of SBP from those labs. Tunneled HD catheter removed today by general surgery, currently concern for this as source of bacteremia. Request to IR for therapeutic paracentesis - discussed with patient's mother via phone who gives verbal consent to proceed.  Presented to ICU for bedside procedure, patient just finished HD and per RN has been having multiple episodes of hypoglycemia. Patient moaning and yelling in pain when abdomen is touched with US  probe or finger. Noted on US  is significant hepatomegaly extending to the RLQ. Blood soaked bandage present to LLQ which was removed without further drainage noted.  The RLQ was prepped and draped in sterile fashion initially, however the patient then placed her hand over the drape and this was therefore removed. Area was re-prepped and re-draped in sterile fashion and skin infiltrated briefly with approximately 1 cc of lidocaine  before patient began screaming stating no no no while curling legs up and thrashing arms. Procedure was stopped due to concern for patient safety given patient unable to remain still with needle in abdomen and the intended procedural area was again contaminated by patient movement.  Decision was made to hold on further paracentesis attempts today given patient AMS and difficulty following commands for safe procedure. IR APP will be on site on Wednesday and Friday, please place new order for paracentesis if patient mental status improves and primary team would like us  to re-attempt therapeutic paracentesis.  US  images available for review under imaging section of Epic.  Clotilda Hesselbach, PA-C

## 2024-08-29 NOTE — Procedures (Signed)
 Received patient in bed. Alert and oriented.  Informed consent signed and in chart.  LUA AVF cannulated x 2 with 15g needles per policy, without difficulty. Tx initiated per MD order. UF goal 2500 ml. TX duration:3.75 Tx complete. Blood returned. Need;es removed, sites held x 2 until hemostasis achieved. Gauze changed prior to taping. Patient tolerated well.   Alert, without acute distress.  Hand-off given to patient's nurse.   Access used: LUA AVF Access issues: none  Total UF removed: Medication(s) given: See MAR   [name]

## 2024-08-29 NOTE — Plan of Care (Signed)
   Problem: Elimination: Goal: Will not experience complications related to bowel motility Outcome: Progressing   Problem: Skin Integrity: Goal: Risk for impaired skin integrity will decrease Outcome: Progressing

## 2024-08-30 ENCOUNTER — Encounter (HOSPITAL_COMMUNITY): Payer: Self-pay | Admitting: Certified Registered Nurse Anesthetist

## 2024-08-30 ENCOUNTER — Other Ambulatory Visit (HOSPITAL_COMMUNITY)

## 2024-08-30 ENCOUNTER — Encounter (HOSPITAL_COMMUNITY)
Admission: EM | Disposition: A | Discharge status: Home Health | Payer: Self-pay | Source: Home / Self Care | Attending: Internal Medicine

## 2024-08-30 DIAGNOSIS — Z992 Dependence on renal dialysis: Secondary | ICD-10-CM | POA: Diagnosis not present

## 2024-08-30 DIAGNOSIS — N186 End stage renal disease: Secondary | ICD-10-CM | POA: Diagnosis not present

## 2024-08-30 LAB — LACTIC ACID, PLASMA: Lactic Acid, Venous: 1.2 mmol/L (ref 0.5–1.9)

## 2024-08-30 LAB — ECHOCARDIOGRAM COMPLETE
AR max vel: 1.95 cm2
AV Area VTI: 1.68 cm2
AV Area mean vel: 1.8 cm2
AV Mean grad: 14 mmHg
AV Peak grad: 26.4 mmHg
Ao pk vel: 2.57 m/s
Area-P 1/2: 2 cm2
Height: 67 in
S' Lateral: 3 cm
Weight: 2786.61 [oz_av]

## 2024-08-30 LAB — GLUCOSE, CAPILLARY
Glucose-Capillary: 66 mg/dL — ABNORMAL LOW (ref 70–99)
Glucose-Capillary: 74 mg/dL (ref 70–99)
Glucose-Capillary: 75 mg/dL (ref 70–99)
Glucose-Capillary: 80 mg/dL (ref 70–99)
Glucose-Capillary: 80 mg/dL (ref 70–99)
Glucose-Capillary: 91 mg/dL (ref 70–99)
Glucose-Capillary: 91 mg/dL (ref 70–99)
Glucose-Capillary: 92 mg/dL (ref 70–99)
Glucose-Capillary: 92 mg/dL (ref 70–99)
Glucose-Capillary: 99 mg/dL (ref 70–99)

## 2024-08-30 LAB — COMPREHENSIVE METABOLIC PANEL WITH GFR
ALT: 17 U/L (ref 0–44)
AST: 25 U/L (ref 15–41)
Albumin: 2.2 g/dL — ABNORMAL LOW (ref 3.5–5.0)
Alkaline Phosphatase: 113 U/L (ref 38–126)
Anion gap: 11 (ref 5–15)
BUN: 58 mg/dL — ABNORMAL HIGH (ref 6–20)
CO2: 26 mmol/L (ref 22–32)
Calcium: 6.3 mg/dL — CL (ref 8.9–10.3)
Chloride: 96 mmol/L — ABNORMAL LOW (ref 98–111)
Creatinine, Ser: 12.95 mg/dL — ABNORMAL HIGH (ref 0.44–1.00)
GFR, Estimated: 3 mL/min — ABNORMAL LOW (ref >60)
Glucose, Bld: 84 mg/dL (ref 70–99)
Potassium: 4.2 mmol/L (ref 3.5–5.1)
Sodium: 133 mmol/L — ABNORMAL LOW (ref 135–145)
Total Bilirubin: 0.8 mg/dL (ref 0.0–1.2)
Total Protein: 4.7 g/dL — ABNORMAL LOW (ref 6.5–8.1)

## 2024-08-30 LAB — CBC
HCT: 22.3 % — ABNORMAL LOW (ref 36.0–46.0)
Hemoglobin: 7.3 g/dL — ABNORMAL LOW (ref 12.0–15.0)
MCH: 29.6 pg (ref 26.0–34.0)
MCHC: 32.7 g/dL (ref 30.0–36.0)
MCV: 90.3 fL (ref 80.0–100.0)
Platelets: 50 K/uL — ABNORMAL LOW (ref 150–400)
RBC: 2.47 MIL/uL — ABNORMAL LOW (ref 3.87–5.11)
RDW: 16.1 % — ABNORMAL HIGH (ref 11.5–15.5)
WBC: 11.3 K/uL — ABNORMAL HIGH (ref 4.0–10.5)
nRBC: 0 % (ref 0.0–0.2)

## 2024-08-30 LAB — BASIC METABOLIC PANEL WITH GFR
Anion gap: 16 — ABNORMAL HIGH (ref 5–15)
BUN: 52 mg/dL — ABNORMAL HIGH (ref 6–20)
CO2: 24 mmol/L (ref 22–32)
Calcium: 6.1 mg/dL — CL (ref 8.9–10.3)
Chloride: 94 mmol/L — ABNORMAL LOW (ref 98–111)
Creatinine, Ser: 12.96 mg/dL — ABNORMAL HIGH (ref 0.44–1.00)
GFR, Estimated: 3 mL/min — ABNORMAL LOW (ref >60)
Glucose, Bld: 76 mg/dL (ref 70–99)
Potassium: 4.1 mmol/L (ref 3.5–5.1)
Sodium: 134 mmol/L — ABNORMAL LOW (ref 135–145)

## 2024-08-30 LAB — PHOSPHORUS: Phosphorus: 6 mg/dL — ABNORMAL HIGH (ref 2.5–4.6)

## 2024-08-30 LAB — VITAMIN D 25 HYDROXY (VIT D DEFICIENCY, FRACTURES): Vit D, 25-Hydroxy: 18.35 ng/mL — ABNORMAL LOW (ref 30–100)

## 2024-08-30 LAB — MAGNESIUM: Magnesium: 1.9 mg/dL (ref 1.7–2.4)

## 2024-08-30 SURGERY — ECHOCARDIOGRAM, TRANSESOPHAGEAL
Anesthesia: Monitor Anesthesia Care

## 2024-08-30 MED ORDER — SODIUM CHLORIDE 0.9 % IV SOLN
4.0000 g | Freq: Once | INTRAVENOUS | Status: AC
  Administered 2024-08-30: 4 g via INTRAVENOUS
  Filled 2024-08-30: qty 40

## 2024-08-30 MED ORDER — LEVETIRACETAM 250 MG PO TABS: 250.0000 mg | ORAL_TABLET | ORAL | Status: DC

## 2024-08-30 MED ORDER — DEXTROSE 50 % IV SOLN
12.5000 g | INTRAVENOUS | Status: AC
  Administered 2024-08-30: 12.5 g via INTRAVENOUS

## 2024-08-30 MED ORDER — CALCIUM GLUCONATE-NACL 2-0.675 GM/100ML-% IV SOLN
2.0000 g | Freq: Once | INTRAVENOUS | Status: DC
  Filled 2024-08-30: qty 100

## 2024-08-30 MED ORDER — CALCIUM GLUCONATE-NACL 1-0.675 GM/50ML-% IV SOLN
1.0000 g | Freq: Once | INTRAVENOUS | Status: AC
  Administered 2024-08-30: 1000 mg via INTRAVENOUS
  Filled 2024-08-30: qty 50

## 2024-08-30 MED ORDER — DEXTROSE 50 % IV SOLN
INTRAVENOUS | Status: AC
  Filled 2024-08-30: qty 50

## 2024-08-30 MED ORDER — CEFAZOLIN IV (FOR PTA / DISCHARGE USE ONLY): 2.0000 g | INTRAVENOUS | Status: AC

## 2024-08-30 MED ORDER — ORAL CARE MOUTH RINSE: 15.0000 mL | OROMUCOSAL | Status: DC | PRN

## 2024-08-30 MED ORDER — DARBEPOETIN ALFA 100 MCG/0.5ML IJ SOSY
100.0000 ug | PREFILLED_SYRINGE | INTRAMUSCULAR | Status: DC
  Administered 2024-08-31: 100 ug via SUBCUTANEOUS
  Filled 2024-08-30: qty 0.5

## 2024-08-30 MED ORDER — CALCITRIOL 0.25 MCG PO CAPS
0.5000 ug | ORAL_CAPSULE | ORAL | Status: DC
Start: 2024-08-31 — End: 2024-09-01
  Administered 2024-08-31: 0.5 ug via ORAL
  Filled 2024-08-30 (×2): qty 2

## 2024-08-30 NOTE — Progress Notes (Addendum)
 CRITICAL VALUE STICKER  CRITICAL VALUE: Calcium  6.3  RECEIVER (on-site recipient of call): Rosina RAMAN, RN  DATE & TIME NOTIFIED: 08/30/2024 at [phone]  MESSENGER (representative from lab):  MD NOTIFIED: Dr. Maree  TIME OF NOTIFICATION: 08/30/2024 at 0707  RESPONSE:  see new orders

## 2024-08-30 NOTE — TOC Progression Note (Signed)
 Transition of Care [facility]) - Progression Note    Patient Details  Name: Cathy [name] MRN: [id number] Date of Birth: 1981/03/09  [facility] CM/SW Contact  Sharlyne Stabs, RN Phone Number: 08/30/2024, 2:25 PM  Clinical Narrative:   CM at the bedside, Patient very sleepy, not wanting to answer questions. Very ill per report, Waiting for TEE, low HGB. DC 2-3 days.    Expected Discharge Plan: Home w Home Health Services Barriers to Discharge: Continued Medical Work up

## 2024-08-30 NOTE — Progress Notes (Signed)
    TEE was canceled today after discussion with Dr. Mallipeddi. Patient's hemoglobin and platelet levels have been fluctuating and trending down. While this represents a relative contraindication and the procedure could be performed with caution, there is significant concern regarding the patient's markedly low calcium  levels and the associated risk of arrhythmias with sedation. In the interest of patient safety, the decision was made to defer TEE until calcium  levels return to baseline. The expectation is that hemoglobin and platelet counts will also stabilize by that time. Cardiology team to be contacted once labs normalize to reassess timing of TE.    Signed, Lorette CINDERELLA Kapur, PA-C 08/30/2024, 9:36 AM

## 2024-08-30 NOTE — Progress Notes (Signed)
 Hypoglycemic Event  CBG: 66  Treatment: D50 25 mL (12.5 gm)  Symptoms: None  Follow-up CBG: Time: 0429 CBG Result:99  Possible Reasons for Event: Other: PIV issue that delayed D10W infusion  Comments/MD notified: Dr. Segars    Asya Derryberry S Stephanine Reas, RN

## 2024-08-30 NOTE — Progress Notes (Addendum)
 PROGRESS NOTE    Cathy [name]  FMW:[id number] DOB: 1981/02/12 DOA: 08/28/2024 PCP: Terry Wilhelmena Lloyd Hilario, FNP   Brief Narrative:    Cathy [name] is a 43 y.o. female with medical history significant of ESRD on hemodialysis, Tuesday, Thursday and Saturday, cirrhosis, depression, lupus, history of pulmonary embolism and bipolar/ depression with presented with altered mental status.  Patient was found down by her family. Unknown for how long patient was on the floor. EMS was called, she wad found confused and febrile, then she was transported to the ED. she was recently discharged on 9/26 after hospitalization for volume overload with hyperkalemia and acidosis and underwent hemodialysis as well as large-volume paracentesis.  She has now been admitted with concerns for acute metabolic encephalopathy secondary to severe sepsis with concerns for SBP and is now noted to have some Staph aureus bacteremia for which ID has recommended TEE, however patient is not stable to have TEE at this point given significant hypocalcemia as well as worsening anemia and thrombocytopenia, now recommendation is for 6 weeks of cefazolin  with hemodialysis.  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  Initial concern for SBP, however ascitic fluid analysis does not appear concerning and Gram stain negative with cultures showing no  growth HD catheter removed 9/29 with tip cultures pending Antibiotic therapy with IV ceftriaxone  2 g daily MRSA PCR negative No need for paracentesis at this time TEE currently canceled by cardiology on 9/30 given significant hypocalcemia as well as anemia and thrombocytopenia, now ID recommending 6 weeks of cefazolin  with hemodialysis  End-stage renal disease on hemodialysis [facility]) Appreciate nephrology for dialysis with next session planned 10/1  Hypocalcemia Possibly due to noncompliance with calcitriol after discussion with nephrology Continue supplement with IV calcium  gluconate Further evaluation with PTH, vitamin D , and ionized calcium  pending Continue to monitor  Anemia of chronic disease Hemoglobin 7.3 Transfuse for hemoglobin less than 7 Nephrology plans to start Aranesp  and check iron  panel  Thrombocytopenia Discontinue heparin  and continue to monitor on CBC   Hypertension-with soft blood pressure readings Due to risk of hypotension hold on losartan , hydralazine , amlodipine  and carvedilol   Will continue clonidine  to avoid rebound hypertension. Continue close blood pressure monitoring    Chronic systolic CHF (congestive heart failure) (HCC) Volume overload due to renal failure with ESRD, not compliant with hemodialysis and ultrafiltration Last echocardiogram with reduced LV systolic function Continue blood pressure monitoring    Cirrhosis of liver with ascites (HCC) Follow up with ascitic fluid analysis. No need for paracentesis at this time, continue to monitor   Hypoglycemia Positive hypoglycemia, likely due to poor oral intake, low glycogen storages and reduced GFR.  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) Follow up as outpatient, no clinical signs of exacerbation   History of pulmonary embolus Patient currently not on anticoagulation   MDD (major  depressive disorder) Continue  with buspirone  hydroxyzine     History of seizures Continue Keppra    DVT prophylaxis:Heparin  discontinued given thrombocytopenia Code Status: Full Family Communication: None at bedside Disposition Plan:  Status is: Inpatient Remains inpatient appropriate because: Need for IV medications   Consultants:  Nephrology ID Cardiology  Procedures:  Dialysis catheter removal 9/29  Antimicrobials:  Anti-infectives (From admission, onward)    Start     Dose/Rate Route Frequency Ordered Stop   08/30/24 0000  cefTRIAXone  (ROCEPHIN ) 2 g in sodium chloride  0.9 % 100 mL IVPB  Status:  Discontinued        2 g 200 mL/hr over 30 Minutes Intravenous Every 24 hours 08/29/24 0029 08/29/24 1026   08/29/24 2000  ceFAZolin  (ANCEF ) IVPB 1 g/50 mL premix        1 g 100 mL/hr over 30 Minutes Intravenous Every 24 hours 08/29/24 1026     08/28/24 2245  cefTRIAXone  (ROCEPHIN ) 2 g in sodium chloride  0.9 % 100 mL IVPB        2 g 200 mL/hr over 30 Minutes Intravenous  Once 08/28/24 2234 08/29/24 1956         Subjective: Patient seen and evaluated today and overall doing better and seems more alert and hungry this morning.  Continue to have some episodes of hypoglycemia overnight for which D10 infusion was started.  Objective: Vitals:   08/30/24 0900 08/30/24 1000 08/30/24 1100 08/30/24 1216  BP: 122/63 118/61 108/61 97/66  Pulse: 61 60 60   Resp: 12 13 (!) 6   Temp:   98 F (36.7 C)   TempSrc:   Oral   SpO2: 96% 96% 100%   Weight:      Height:        Intake/Output Summary (Last 24 hours) at 08/30/2024 1253 Last data filed at 08/30/2024 0700 Gross per 24 hour  Intake 541.21 ml  Output 2500 ml  Net -1958.79 ml   Filed Weights   08/29/24 0300 08/29/24 1000 08/29/24 1445  Weight: 81.5 kg 81.5 kg 79 kg    Examination:  General exam: Appears somnolent but arousable Respiratory system: Clear to auscultation. Respiratory effort normal.  Nasal  cannula. Cardiovascular system: S1 & S2 heard, RRR.  Gastrointestinal system: Abdomen is soft, distended and tender to palpation but not tense Central nervous system: Alert and awake Extremities: No edema Skin: No significant lesions noted Psychiatry: Flat affect.    Data Reviewed: I have personally reviewed following labs and imaging studies  CBC: Recent Labs  Lab 08/24/24 2240 08/25/24 0340 08/28/24 2043 08/29/24 1539 08/30/24 0514  WBC 5.2 4.8 10.7* 15.0* 11.3*  NEUTROABS 4.0  --  9.8*  --   --   HGB 8.4* 7.4* 9.0* 8.1* 7.3*  HCT 26.5* 24.5* 28.2* 25.2* 22.3*  MCV 95.7 96.5 91.9 91.3 90.3  PLT 87* 79* 71* 70* 50*   Basic Metabolic Panel: Recent Labs  Lab 08/25/24 0340 08/28/24 2043 08/29/24 0349 08/29/24 2323 08/30/24 0514  NA 141 140 138 134* 133*  K 5.1 6.1* 5.7* 4.1 4.2  CL 100 100 102 94* 96*  CO2 22 17* 17* 24 26  GLUCOSE 99 64* 149* 76 84  BUN 63* 91* 92* 52* 58*  CREATININE 16.42* 20.60* 20.41* 12.96* 12.95*  CALCIUM  6.8* 6.4* 5.7* 6.1* 6.3*  MG 2.0  --   --   --  1.9  PHOS 7.9*  --   --   --  6.0*   GFR: Estimated Creatinine Clearance: 6.3 mL/min (A) (by C-G formula  based on SCr of 12.95 mg/dL (H)). Liver Function Tests: Recent Labs  Lab 08/24/24 2240 08/28/24 2043 08/29/24 0349 08/30/24 0514  AST 26 61* 73* 25  ALT 14 21 26 17   ALKPHOS 161* 180* 148* 113  BILITOT 0.7 1.5* 1.1 0.8  PROT 6.7 5.6* 5.1* 4.7*  ALBUMIN  3.2* 2.9* 2.6* 2.2*   Recent Labs  Lab 08/28/24 2043  LIPASE 25   Recent Labs  Lab 08/28/24 2043  AMMONIA 24   Coagulation Profile: Recent Labs  Lab 08/25/24 0340 08/28/24 2043  INR 1.2 1.4*   Cardiac Enzymes: No results for input(s): CKTOTAL, CKMB, CKMBINDEX, TROPONINI in the last 168 hours. BNP (last 3 results) No results for input(s): PROBNP in the last 8760 hours. HbA1C: No results for input(s): HGBA1C in the last 72 hours. CBG: Recent Labs  Lab 08/30/24 0406 08/30/24 0429 08/30/24 0722  08/30/24 1008 08/30/24 1121  GLUCAP 66* 99 80 74 75   Lipid Profile: No results for input(s): CHOL, HDL, LDLCALC, TRIG, CHOLHDL, LDLDIRECT in the last 72 hours. Thyroid  Function Tests: Recent Labs    08/28/24 2043  TSH 2.303   Anemia Panel: No results for input(s): VITAMINB12, FOLATE, FERRITIN, TIBC, IRON , RETICCTPCT in the last 72 hours. Sepsis Labs: Recent Labs  Lab 08/28/24 2043 08/29/24 2323  LATICACIDVEN 1.4 1.2    Recent Results (from the past 240 hours)  Gram stain     Status: None   Collection Time: 08/26/24 11:43 AM   Specimen: Peritoneal Washings; Peritoneal Fluid  Result Value Ref Range Status   Specimen Description PERITONEAL  Final   Special Requests NONE  Final   Gram Stain   Final    CYTOSPIN SMEAR ASCITIC WBC PRESENT, PREDOMINANTLY MONONUCLEAR NO ORGANISMS SEEN Performed at [facility], [address] [place], [place] [address]    Report Status 08/26/2024 FINAL  Final  Culture, blood (routine x 2)     Status: Abnormal (Preliminary result)   Collection Time: 08/28/24  8:43 PM   Specimen: BLOOD  Result Value Ref Range Status   Specimen Description   Final    BLOOD RIGHT ANTECUBITAL Performed at [facility], [address], [place], [place] [address]    Special Requests   Final    BOTTLES DRAWN AEROBIC AND ANAEROBIC Blood Culture adequate volume Performed at [facility], [address], [place], [place] [address]    Culture  Setup Time   Final    GRAM POSITIVE COCCI IN BOTH AEROBIC AND ANAEROBIC BOTTLES Gram Stain Report Called to,Read Back By and Verified With: WOODEN C AT 0627 ON [id number] BY PURDIE J CRITICAL RESULT CALLED TO, READ BACK BY AND VERIFIED WITH: PHARMD LINDA LONG [id number] FCP    Culture (A)  Final    STAPHYLOCOCCUS AUREUS SUSCEPTIBILITIES TO FOLLOW Performed at [facility], [address] [place], [place] [address]    Report Status PENDING  Incomplete  Resp panel by RT-PCR (RSV, Flu  A&B, Covid) Anterior Nasal Swab     Status: None   Collection Time: 08/28/24  8:43 PM   Specimen: Anterior Nasal Swab  Result Value Ref Range Status   SARS Coronavirus 2 by RT PCR NEGATIVE NEGATIVE Final    Comment: (NOTE) SARS-CoV-2 target nucleic acids are NOT DETECTED.  The SARS-CoV-2 RNA is generally detectable in upper respiratory specimens during the acute phase of infection. The lowest concentration of SARS-CoV-2 viral copies this assay can detect is 138 copies/mL. A negative result does not preclude  SARS-Cov-2 infection and should not be used as the sole basis for treatment or other patient management decisions. A negative result may occur with  improper specimen collection/handling, submission of specimen other than nasopharyngeal swab, presence of viral mutation(s) within the areas targeted by this assay, and inadequate number of viral copies(<138 copies/mL). A negative result must be combined with clinical observations, patient history, and epidemiological information. The expected result is Negative.  Fact Sheet for Patients:  BloggerCourse.com  Fact Sheet for Healthcare Providers:  SeriousBroker.it  This test is no t yet approved or cleared by the [place]  FDA and  has been authorized for detection and/or diagnosis of SARS-CoV-2 by FDA under an Emergency Use Authorization (EUA). This EUA will remain  in effect (meaning this test can be used) for the duration of the COVID-19 declaration under Section 564(b)(1) of the Act, 21 U.S.C.section 360bbb-3(b)(1), unless the authorization is terminated  or revoked sooner.       Influenza A by PCR NEGATIVE NEGATIVE Final   Influenza B by PCR NEGATIVE NEGATIVE Final    Comment: (NOTE) The Xpert Xpress SARS-CoV-2/FLU/RSV plus assay is intended as an aid in the diagnosis of influenza from Nasopharyngeal swab specimens and should not be used as a sole basis for treatment.  Nasal washings and aspirates are unacceptable for Xpert Xpress SARS-CoV-2/FLU/RSV testing.  Fact Sheet for Patients: BloggerCourse.com  Fact Sheet for Healthcare Providers: SeriousBroker.it  This test is not yet approved or cleared by the [place]  FDA and has been authorized for detection and/or diagnosis of SARS-CoV-2 by FDA under an Emergency Use Authorization (EUA). This EUA will remain in effect (meaning this test can be used) for the duration of the COVID-19 declaration under Section 564(b)(1) of the Act, 21 U.S.C. section 360bbb-3(b)(1), unless the authorization is terminated or revoked.     Resp Syncytial Virus by PCR NEGATIVE NEGATIVE Final    Comment: (NOTE) Fact Sheet for Patients: BloggerCourse.com  Fact Sheet for Healthcare Providers: SeriousBroker.it  This test is not yet approved or cleared by the [place]  FDA and has been authorized for detection and/or diagnosis of SARS-CoV-2 by FDA under an Emergency Use Authorization (EUA). This EUA will remain in effect (meaning this test can be used) for the duration of the COVID-19 declaration under Section 564(b)(1) of the Act, 21 U.S.C. section 360bbb-3(b)(1), unless the authorization is terminated or revoked.  Performed at [facility], [address] [place], [place] [address]   Blood Culture ID Panel (Reflexed)     Status: Abnormal   Collection Time: 08/28/24  8:43 PM  Result Value Ref Range Status   Enterococcus faecalis NOT DETECTED NOT DETECTED Final   Enterococcus Faecium NOT DETECTED NOT DETECTED Final   Listeria monocytogenes NOT DETECTED NOT DETECTED Final   Staphylococcus species DETECTED (A) NOT DETECTED Final    Comment: CRITICAL RESULT CALLED TO, READ BACK BY AND VERIFIED WITH: PHARMD LINDA LONG [id number] FCP    Staphylococcus aureus (BCID) DETECTED (A) NOT DETECTED Final    Comment:  CRITICAL RESULT CALLED TO, READ BACK BY AND VERIFIED WITH: PHARMD LINDA LONG 1001 [id number] FCP    Staphylococcus epidermidis NOT DETECTED NOT DETECTED Final   Staphylococcus lugdunensis NOT DETECTED NOT DETECTED Final   Streptococcus species NOT DETECTED NOT DETECTED Final   Streptococcus agalactiae NOT DETECTED NOT DETECTED Final   Streptococcus pneumoniae NOT DETECTED NOT DETECTED Final   Streptococcus pyogenes NOT DETECTED NOT DETECTED Final   A.calcoaceticus-baumannii NOT DETECTED NOT DETECTED Final   Bacteroides  fragilis NOT DETECTED NOT DETECTED Final   Enterobacterales NOT DETECTED NOT DETECTED Final   Enterobacter cloacae complex NOT DETECTED NOT DETECTED Final   Escherichia coli NOT DETECTED NOT DETECTED Final   Klebsiella aerogenes NOT DETECTED NOT DETECTED Final   Klebsiella oxytoca NOT DETECTED NOT DETECTED Final   Klebsiella pneumoniae NOT DETECTED NOT DETECTED Final   Proteus species NOT DETECTED NOT DETECTED Final   Salmonella species NOT DETECTED NOT DETECTED Final   Serratia marcescens NOT DETECTED NOT DETECTED Final   Haemophilus influenzae NOT DETECTED NOT DETECTED Final   Neisseria meningitidis NOT DETECTED NOT DETECTED Final   Pseudomonas aeruginosa NOT DETECTED NOT DETECTED Final   Stenotrophomonas maltophilia NOT DETECTED NOT DETECTED Final   Candida albicans NOT DETECTED NOT DETECTED Final   Candida auris NOT DETECTED NOT DETECTED Final   Candida glabrata NOT DETECTED NOT DETECTED Final   Candida krusei NOT DETECTED NOT DETECTED Final   Candida parapsilosis NOT DETECTED NOT DETECTED Final   Candida tropicalis NOT DETECTED NOT DETECTED Final   Cryptococcus neoformans/gattii NOT DETECTED NOT DETECTED Final   Meth resistant mecA/C and MREJ NOT DETECTED NOT DETECTED Final    Comment: Performed at [facility], [address], [place], [place] [address]  Culture, blood (routine x 2)     Status: Abnormal (Preliminary result)   Collection Time: 08/28/24  9:50 PM    Specimen: BLOOD RIGHT HAND  Result Value Ref Range Status   Specimen Description   Final    BLOOD RIGHT HAND Performed at [facility], [address], [place], [place] [address]    Special Requests   Final    BOTTLES DRAWN AEROBIC AND ANAEROBIC Blood Culture adequate volume Performed at [facility], [address] [place], [place] [address]    Culture  Setup Time   Final    GRAM POSITIVE COCCI IN BOTH AEROBIC AND ANAEROBIC BOTTLES Gram Stain Report Called to,Read Back By and Verified With: G ROOS AT 0727 ON [id number] BY S DALTON GRAM STAIN REVIEWED-AGREE WITH RESULT DRT Performed at [facility], [address] [place], [place] [address]    Culture STAPHYLOCOCCUS AUREUS (A)  Final   Report Status PENDING  Incomplete  Body fluid culture w Gram Stain     Status: None (Preliminary result)   Collection Time: 08/28/24 10:39 PM   Specimen: Peritoneal Washings; Body Fluid  Result Value Ref Range Status   Specimen Description   Final    PERITONEAL Performed at [facility], [address], [place], [place] [address]    Special Requests   Final    NONE Performed at [facility], [address] [place], [place] [address]    Gram Stain   Final    NO ORGANISMS SEEN WBC PRESENT, PREDOMINANTLY PMN CYTOSPIN SMEAR Performed at [facility], [address], [place], [place] [address]    Culture   Final    NO GROWTH 1 DAY Performed at [facility], [address] [place], [place] [address]    Report Status PENDING  Incomplete  MRSA Next Gen by PCR, Nasal     Status: None   Collection Time: 08/29/24  1:04 AM   Specimen: Nasal Mucosa; Nasal Swab  Result Value Ref Range Status   MRSA by PCR Next Gen NOT DETECTED NOT DETECTED Final    Comment: (NOTE) The GeneXpert MRSA Assay (FDA approved for NASAL specimens only), is one component of a comprehensive MRSA colonization surveillance program. It is  not intended to diagnose MRSA infection nor to guide or monitor treatment for  MRSA infections. Test performance is not FDA approved in patients less than 58 years old. Performed at [facility], [address] [place], [place] [address]   Culture, blood (Routine X 2) w Reflex to ID Panel     Status: None (Preliminary result)   Collection Time: 08/29/24  3:39 PM   Specimen: BLOOD  Result Value Ref Range Status   Specimen Description BLOOD RIGHT ANTECUBITAL  Final   Special Requests   Final    BOTTLES DRAWN AEROBIC ONLY Blood Culture adequate volume   Culture   Final    NO GROWTH < 24 HOURS Performed at [facility], [address] [place], [place] [address]    Report Status PENDING  Incomplete  Culture, blood (Routine X 2) w Reflex to ID Panel     Status: None (Preliminary result)   Collection Time: 08/29/24  3:45 PM   Specimen: BLOOD  Result Value Ref Range Status   Specimen Description BLOOD RIGHT ANTECUBITAL  Final   Special Requests   Final    BOTTLES DRAWN AEROBIC ONLY Blood Culture results may not be optimal due to an inadequate volume of blood received in culture bottles   Culture   Final    NO GROWTH < 24 HOURS Performed at [facility], [address], [place], [place] [address]    Report Status PENDING  Incomplete         Radiology Studies: ECHOCARDIOGRAM COMPLETE Result Date: 08/30/2024    ECHOCARDIOGRAM REPORT   Patient Name:   NEMESIS RAINWATER Date of Exam: 08/29/2024 Medical Rec #:  [id number]        Height:       67.0 in Accession #:    [id number]       Weight:       174.2 lb Date of Birth:  14-Oct-1981       BSA:          1.907 m Patient Age:    42 years         BP:           106/53 mmHg Patient Gender: F                HR:           66 bpm. Exam Location:  [facility] Procedure: 2D Echo, Cardiac Doppler, Color Doppler and Strain Analysis (Both            Spectral and Color Flow Doppler were utilized during procedure). Indications:    Bacteremia R78.81  History:        Patient has prior history of Echocardiogram examinations, most                  recent 02/03/2022. CHF; Risk Factors:Hypertension and Tobacco                 abuse. Hx of End-stage renal disease on hemodialysis (HCC),                 HFrEF (heart failure with reduced ejection fraction) (HCC).  Sonographer:    Aida Pizza RCS Referring Phys: [id number] Darrin Apodaca D [facility]  Sonographer Comments: Global longitudinal strain was attempted. IMPRESSIONS  1. Left ventricular ejection fraction, by estimation, is 60 to 65%. The left ventricle has normal function. The left ventricle has no regional wall motion abnormalities. There is moderate left ventricular hypertrophy. Left ventricular diastolic parameters are indeterminate.  The average left ventricular global longitudinal strain is -11.1 %.  2. Right ventricular systolic function is moderately reduced. The right ventricular size is moderately enlarged.  3. Left atrial size was moderately dilated.  4. Right atrial size was moderately dilated.  5. The mitral valve is normal in structure. Mild mitral valve regurgitation. No evidence of mitral stenosis.  6. Tricuspid valve regurgitation is moderate to severe.  7. The aortic valve has an indeterminant number of cusps. Aortic valve regurgitation is mild. Mild aortic valve stenosis. Increased aortic valve velocities likely secondary to increased LVOT velocity. Aortic valve area, by VTI measures 1.68 cm. Aortic valve mean gradient measures 14.0 mmHg. Aortic valve Vmax measures 2.57 m/s.  8. The inferior vena cava is dilated but IVC collapsibility could not be evaluated.  9. Increased flow velocities may be secondary to anemia, thyrotoxicosis, hyperdynamic or high flow state. FINDINGS  Left Ventricle: Left ventricular ejection fraction, by estimation, is 60 to 65%. The left ventricle has normal function. The left ventricle has no regional wall motion abnormalities. The average left ventricular global longitudinal strain is -11.1 %. Strain was performed and the global longitudinal strain is indeterminate. The left  ventricular internal cavity size was normal in size. There is moderate left ventricular hypertrophy. Left ventricular diastolic parameters are indeterminate. Right Ventricle: The right ventricular size is moderately enlarged. No increase in right ventricular wall thickness. Right ventricular systolic function is moderately reduced. Left Atrium: Left atrial size was moderately dilated. Right Atrium: Right atrial size was moderately dilated. Pericardium: There is no evidence of pericardial effusion. Mitral Valve: The mitral valve is normal in structure. Mild mitral valve regurgitation. No evidence of mitral valve stenosis. Tricuspid Valve: The tricuspid valve is normal in structure. Tricuspid valve regurgitation is moderate to severe. No evidence of tricuspid stenosis. Aortic Valve: The aortic valve has an indeterminant number of cusps. Aortic valve regurgitation is mild. Mild aortic stenosis is present. Aortic valve mean gradient measures 14.0 mmHg. Aortic valve peak gradient measures 26.4 mmHg. Aortic valve area, by VTI measures 1.68 cm. Pulmonic Valve: The pulmonic valve was normal in structure. Pulmonic valve regurgitation is mild. No evidence of pulmonic stenosis. Aorta: The aortic root is normal in size and structure. Venous: The inferior vena cava is dilated but IVC collapsibility could not be evaluated. IAS/Shunts: No atrial level shunt detected by color flow Doppler. Additional Comments: 3D was performed not requiring image post processing on an independent workstation and was indeterminate.  LEFT VENTRICLE PLAX 2D LVIDd:         4.90 cm   Diastology LVIDs:         3.00 cm   LV e' medial:    8.08 cm/s LV PW:         1.40 cm   LV E/e' medial:  17.3 LV IVS:        1.30 cm   LV e' lateral:   12.20 cm/s LVOT diam:     1.80 cm   LV E/e' lateral: 11.5 LV SV:         76 LV SV Index:   40        2D Longitudinal Strain LVOT Area:     2.54 cm  2D Strain GLS Avg:     -11.1 %  RIGHT VENTRICLE RV S prime:     16.40 cm/s  TAPSE (M-mode): 2.7 cm LEFT ATRIUM             Index  RIGHT ATRIUM           Index LA diam:        3.40 cm 1.78 cm/m   RA Area:     23.10 cm LA Vol (A2C):   88.1 ml 46.20 ml/m  RA Volume:   68.00 ml  35.66 ml/m LA Vol (A4C):   77.3 ml 40.54 ml/m LA Biplane Vol: 82.4 ml 43.21 ml/m  AORTIC VALVE AV Area (Vmax):    1.95 cm AV Area (Vmean):   1.80 cm AV Area (VTI):     1.68 cm AV Vmax:           257.00 cm/s AV Vmean:          171.000 cm/s AV VTI:            0.450 m AV Peak Grad:      26.4 mmHg AV Mean Grad:      14.0 mmHg LVOT Vmax:         197.00 cm/s LVOT Vmean:        121.000 cm/s LVOT VTI:          0.297 m LVOT/AV VTI ratio: 0.66  AORTA Ao Root diam: 3.10 cm MITRAL VALVE                TRICUSPID VALVE MV Area (PHT): 2.00 cm     TR Peak grad:   26.8 mmHg MV Decel Time: 380 msec     TR Vmax:        259.00 cm/s MV E velocity: 140.00 cm/s MV A velocity: 94.00 cm/s   SHUNTS MV E/A ratio:  1.49         Systemic VTI:  0.30 m                             Systemic Diam: 1.80 cm Vishnu Priya Mallipeddi Electronically signed by Diannah Late Mallipeddi Signature Date/Time: 08/30/2024/9:30:31 AM    Final    US  ASCITES (ABDOMEN LIMITED) Result Date: 08/29/2024 EXAM: LIMITED ABDOMINAL ULTRASOUND FOR ASCITES EVALUATION TECHNIQUE: Limited real-time sonography of all 4 quadrants of the abdomen was performed for evaluation of ascites. COMPARISON: None. CLINICAL HISTORY: Ascites. FINDINGS: RIGHT LOWER QUADRANT: Moderate ascites seen. Largest pocket measures 10.7 cm. LEFT LOWER QUADRANT: Moderate ascites seen. OTHER: Limited visualization of the rest of the abdomen demonstrates no other acute abnormality. IMPRESSION: 1. Scattered moderate ascites with the largest pocket in the right lower quadrant measuring up to 10.7 cm. Electronically signed by: Katheleen Faes MD 08/29/2024 03:55 PM EDT RP Workstation: [id number]   CT Head Wo Contrast Result Date: 08/28/2024 CLINICAL DATA:  Mental status changes. EXAM: CT HEAD  WITHOUT CONTRAST TECHNIQUE: Contiguous axial images were obtained from the base of the skull through the vertex without intravenous contrast. RADIATION DOSE REDUCTION: This exam was performed according to the departmental dose-optimization program which includes automated exposure control, adjustment of the mA and/or kV according to patient size and/or use of iterative reconstruction technique. COMPARISON:  05/31/2024 FINDINGS: Brain: No acute intracranial abnormality. Specifically, no hemorrhage, hydrocephalus, mass lesion, acute infarction, or significant intracranial injury. Vascular: No hyperdense vessel or unexpected calcification. Skull: No acute calvarial abnormality. Sinuses/Orbits: No acute findings. Mucosal thickening in the ethmoid air cells and maxillary sinuses. Other: None IMPRESSION: No acute intracranial abnormality. Chronic sinusitis. Electronically Signed   By: Franky Crease M.D.   On: 08/28/2024 21:37   CT CHEST ABDOMEN PELVIS W CONTRAST Result Date: 08/28/2024 CLINICAL  DATA:  Sepsis suspected.  Found on floor. EXAM: CT CHEST, ABDOMEN, AND PELVIS WITH CONTRAST TECHNIQUE: Multidetector CT imaging of the chest, abdomen and pelvis was performed following the standard protocol during bolus administration of intravenous contrast. RADIATION DOSE REDUCTION: This exam was performed according to the departmental dose-optimization program which includes automated exposure control, adjustment of the mA and/or kV according to patient size and/or use of iterative reconstruction technique. CONTRAST:  OMNIPAQUE  IOHEXOL  300 MG/ML  SOLN COMPARISON:  02/01/2024 FINDINGS: CT CHEST FINDINGS Cardiovascular: Cardiomegaly. Scattered coronary artery calcifications. Dialysis catheter tip in the right atrium. Aorta normal caliber. Mediastinum/Nodes: No mediastinal, hilar, or axillary adenopathy. Trachea and esophagus are unremarkable. Thyroid  unremarkable. Lungs/Pleura: Small right pleural effusion loculated  superiorly and posteriorly and within the major fissure. No confluent airspace opacities or overt edema. Musculoskeletal: Chest wall soft tissues are unremarkable. No acute bony abnormality. CT ABDOMEN PELVIS FINDINGS Hepatobiliary: No focal hepatic abnormality. Gallbladder unremarkable. Pancreas: No focal abnormality or ductal dilatation. Spleen: No focal abnormality.  Normal size. Adrenals/Urinary Tract: Kidneys are atrophic. No renal or adrenal mass. No hydronephrosis. Urinary bladder not visualized. Stomach/Bowel: Normal appendix. Stomach, large and small bowel grossly unremarkable. Vascular/Lymphatic: No evidence of aneurysm or adenopathy. Diffuse aortic atherosclerosis. Reproductive: Uterus and adnexa unremarkable.  No mass. Other: Large volume ascites, slightly increased since prior study. Musculoskeletal: No acute bony abnormality. IMPRESSION: Cardiomegaly, coronary artery disease.  Aortic atherosclerosis. Small loculated right pleural effusion posteriorly and in the right major fissure. Large volume ascites, increasing since prior study. Electronically Signed   By: Franky Crease M.D.   On: 08/28/2024 21:34        Scheduled Meds:  aspirin   81 mg Oral QHS   busPIRone   7.5 mg Oral BID   [START ON 08/31/2024] calcitRIOL  0.5 mcg Oral Q M,W,F-HD   Chlorhexidine  Gluconate Cloth  6 each Topical Q0600   [START ON 08/31/2024] darbepoetin (ARANESP ) injection - DIALYSIS  100 mcg Subcutaneous Q Wed-1800   dextrose        gabapentin   300 mg Oral QHS   levETIRAcetam   500 mg Oral Daily   And   levETIRAcetam   250 mg Oral Q T,Th,Sa-HD   [START ON 08/31/2024] levETIRAcetam   250 mg Oral Q M,W,F-HD   pantoprazole   40 mg Oral QAC breakfast   Continuous Infusions:  anticoagulant sodium citrate       ceFAZolin  (ANCEF ) IV Stopped (08/29/24 2029)   dextrose  25 mL/hr at 08/30/24 0700     LOS: 2 days    Time spent: 55 minutes    Jarmarcus Wambold D Maree, DO [facility]  If 7PM-7AM, please contact  night-coverage [email or web address] 08/30/2024, 12:53 PM

## 2024-08-30 NOTE — Progress Notes (Signed)
   08/30/24 1520  Spiritual Encounters  Type of Visit Initial  Care provided to: Patient  Referral source IDT Rounds  Reason for visit Routine spiritual support  OnCall Visit No  Spiritual Framework  Presenting Themes Courage hope and growth;Significant life change  Community/Connection Family  Patient Stress Factors None identified  Family Stress Factors None identified  Interventions  Spiritual Care Interventions Made Established relationship of care and support;Compassionate presence;Prayer  Intervention Outcomes  Outcomes Connection to spiritual care;Awareness of support  Spiritual Care Plan  Spiritual Care Issues Still Outstanding Chaplain will continue to follow   Found patient laying in ICU bed with eyes open. I sat bedside and was welcomed warmly. Patient closed eyes and fell asleep. Chaplain remained until she was comfortable. Will continue to visit in order to provide spiritual support and to assess for spiritual need.   Ether Blush, M.Div Chaplain

## 2024-08-30 NOTE — Plan of Care (Signed)

## 2024-08-30 NOTE — Progress Notes (Signed)
 PHARMACY CONSULT NOTE FOR:  OUTPATIENT  PARENTERAL ANTIBIOTIC THERAPY (OPAT)  Informational OPAT only as the patient will be receiving antibiotics outpatient with hemodialysis  Indication: MSSA bacteremia Regimen: Cefazolin  2g/HD-TTS End date: 10/10/24 (6 weeks from [facility] removal and neg BCx 9/29)  IV antibiotic discharge orders are pended. To discharging provider:  please sign these orders via discharge navigator,  Select New Orders & click on the button choice - Manage This Unsigned Work.     Thank you for allowing pharmacy to be a part of this patient's care.  Almarie Lunger, PharmD, BCPS, BCIDP Infectious Diseases Clinical Pharmacist 08/30/2024 2:51 PM   **Pharmacist phone directory can now be found on [email or web address] ([email or web address]).  Listed under [facility].

## 2024-08-30 NOTE — Progress Notes (Signed)
 Id brief note  Catheter removed yesterday  Chart review no event  Tee canceled as thrombocytopenia  Discussed with team  Plan 6 weeks cefazolin  tiw with dialysis from today given risk.  Tte no obvious veg  Follow up id clinic in about 4 weeks.  Will have or clinic staff arrange

## 2024-08-30 NOTE — Progress Notes (Addendum)
 [facility] Progress Note    Assessment/ Plan:   ESRD  -outpatient HD orders (per recent admission):  [facility] on TTS (schedule recently changed) . EDW 69 kg HD Bath 1K/2.5Ca, Na 138 Time 4:00 Heparin  500 bolus then 500 units/hr. Access LUE AVF BFR 400 DFR 600, 15g 1in, Nipro 17H, Temp 37 -HD tomorrow, maintain MWF for now, can transition back to TTS on discharge or later during admit   Sepsis secondary to SBP/gram positive bacteremia -abx/mgmt per primary service & ID--currently receiving ancef  -TDC removed yesterday -TEE pending for today   Hyperkalemia -improved. Managing with HD   Volume/ hypertension  -EDW 69kg. UF as tolerated -anti-HTNs currently on hold given high risk for hypotension   Chronic Systolic CHF -UF as tolerated with HD   Cirrhosis -s/p diagnostic paracentesis -per primary service   Anemia of Chronic Kidney Disease Hemoglobin 7.3  -Avoid IV iron  in the context of bacteremia -Transfuse PRN for Hgb <7 -checking Fe panel. Starting aranesp  100mcg qwed's   Secondary Hyperparathyroidism/Hyperphosphatemia - resume home meds, monitor phos  -cca ~7.7, start calcitriol with HD  Vascular access -TDC removed by gen surg on 9/29 given bacteremia. AVF is functional  Discussed with primary service.  Addendum: received message from pharmacy: plan for 6 weeks of Cefazolin  2g/HD-TTS thru 10/10/24   Subjective:   Patient seen and examined bedside. Net uf 2.5L with HD yesterday. Has been hypoglycemic therefore runningon d10. She is not aware that her [facility] is out and keeps repeating it is out. She is drowsy but awake. Upon prompting, she denies any chest pain, SOB, abdominal pain.   Objective:   BP (!) 99/57   Pulse 60   Temp 98 F (36.7 C) (Oral)   Resp 16   Ht 5' 7 (1.702 m)   Wt 79 kg   LMP 04/07/2020 (Within Months)   SpO2 96%   BMI 27.28 kg/m   Intake/Output Summary (Last 24 hours) at 08/30/2024 0824 Last data filed at  08/30/2024 0700 Gross per 24 hour  Intake 541.21 ml  Output 2500 ml  Net -1958.79 ml   Weight change: -3.5 kg  Physical Exam: Gen: ill appearing CVS: RRR Resp: CTA BL Abd: distended, soft, NT Ext: no sig edema b/l Les Neuro: awake but drowsy, following some commands Dialysis access: LUE AVF +b/t  Imaging: US  ASCITES (ABDOMEN LIMITED) Result Date: 08/29/2024 EXAM: LIMITED ABDOMINAL ULTRASOUND FOR ASCITES EVALUATION TECHNIQUE: Limited real-time sonography of all 4 quadrants of the abdomen was performed for evaluation of ascites. COMPARISON: None. CLINICAL HISTORY: Ascites. FINDINGS: RIGHT LOWER QUADRANT: Moderate ascites seen. Largest pocket measures 10.7 cm. LEFT LOWER QUADRANT: Moderate ascites seen. OTHER: Limited visualization of the rest of the abdomen demonstrates no other acute abnormality. IMPRESSION: 1. Scattered moderate ascites with the largest pocket in the right lower quadrant measuring up to 10.7 cm. Electronically signed by: Katheleen Faes MD 08/29/2024 03:55 PM EDT RP Workstation: [id number]   CT Head Wo Contrast Result Date: 08/28/2024 CLINICAL DATA:  Mental status changes. EXAM: CT HEAD WITHOUT CONTRAST TECHNIQUE: Contiguous axial images were obtained from the base of the skull through the vertex without intravenous contrast. RADIATION DOSE REDUCTION: This exam was performed according to the departmental dose-optimization program which includes automated exposure control, adjustment of the mA and/or kV according to patient size and/or use of iterative reconstruction technique. COMPARISON:  05/31/2024 FINDINGS: Brain: No acute intracranial abnormality. Specifically, no hemorrhage, hydrocephalus, mass lesion, acute infarction, or significant intracranial injury. Vascular: No hyperdense vessel or  unexpected calcification. Skull: No acute calvarial abnormality. Sinuses/Orbits: No acute findings. Mucosal thickening in the ethmoid air cells and maxillary sinuses. Other: None IMPRESSION:  No acute intracranial abnormality. Chronic sinusitis. Electronically Signed   By: Franky Crease M.D.   On: 08/28/2024 21:37   CT CHEST ABDOMEN PELVIS W CONTRAST Result Date: 08/28/2024 CLINICAL DATA:  Sepsis suspected.  Found on floor. EXAM: CT CHEST, ABDOMEN, AND PELVIS WITH CONTRAST TECHNIQUE: Multidetector CT imaging of the chest, abdomen and pelvis was performed following the standard protocol during bolus administration of intravenous contrast. RADIATION DOSE REDUCTION: This exam was performed according to the departmental dose-optimization program which includes automated exposure control, adjustment of the mA and/or kV according to patient size and/or use of iterative reconstruction technique. CONTRAST:  OMNIPAQUE  IOHEXOL  300 MG/ML  SOLN COMPARISON:  02/01/2024 FINDINGS: CT CHEST FINDINGS Cardiovascular: Cardiomegaly. Scattered coronary artery calcifications. Dialysis catheter tip in the right atrium. Aorta normal caliber. Mediastinum/Nodes: No mediastinal, hilar, or axillary adenopathy. Trachea and esophagus are unremarkable. Thyroid  unremarkable. Lungs/Pleura: Small right pleural effusion loculated superiorly and posteriorly and within the major fissure. No confluent airspace opacities or overt edema. Musculoskeletal: Chest wall soft tissues are unremarkable. No acute bony abnormality. CT ABDOMEN PELVIS FINDINGS Hepatobiliary: No focal hepatic abnormality. Gallbladder unremarkable. Pancreas: No focal abnormality or ductal dilatation. Spleen: No focal abnormality.  Normal size. Adrenals/Urinary Tract: Kidneys are atrophic. No renal or adrenal mass. No hydronephrosis. Urinary bladder not visualized. Stomach/Bowel: Normal appendix. Stomach, large and small bowel grossly unremarkable. Vascular/Lymphatic: No evidence of aneurysm or adenopathy. Diffuse aortic atherosclerosis. Reproductive: Uterus and adnexa unremarkable.  No mass. Other: Large volume ascites, slightly increased since prior study.  Musculoskeletal: No acute bony abnormality. IMPRESSION: Cardiomegaly, coronary artery disease.  Aortic atherosclerosis. Small loculated right pleural effusion posteriorly and in the right major fissure. Large volume ascites, increasing since prior study. Electronically Signed   By: Franky Crease M.D.   On: 08/28/2024 21:34    Labs: BMET Recent Labs  Lab 08/24/24 2240 08/25/24 0340 08/28/24 2043 08/29/24 0349 08/29/24 2323 08/30/24 0514  NA 142 141 140 138 134* 133*  K 5.4* 5.1 6.1* 5.7* 4.1 4.2  CL 101 100 100 102 94* 96*  CO2 21* 22 17* 17* 24 26  GLUCOSE 86 99 64* 149* 76 84  BUN 64* 63* 91* 92* 52* 58*  CREATININE 16.29* 16.42* 20.60* 20.41* 12.96* 12.95*  CALCIUM  6.9* 6.8* 6.4* 5.7* 6.1* 6.3*  PHOS  --  7.9*  --   --   --  6.0*   CBC Recent Labs  Lab 08/24/24 2240 08/25/24 0340 08/28/24 2043 08/29/24 1539 08/30/24 0514  WBC 5.2 4.8 10.7* 15.0* 11.3*  NEUTROABS 4.0  --  9.8*  --   --   HGB 8.4* 7.4* 9.0* 8.1* 7.3*  HCT 26.5* 24.5* 28.2* 25.2* 22.3*  MCV 95.7 96.5 91.9 91.3 90.3  PLT 87* 79* 71* 70* 50*    Medications:     aspirin   81 mg Oral QHS   busPIRone   7.5 mg Oral BID   Chlorhexidine  Gluconate Cloth  6 each Topical Q0600   dextrose        gabapentin   300 mg Oral QHS   heparin   5,000 Units Subcutaneous Q8H   levETIRAcetam   500 mg Oral Daily   And   levETIRAcetam   250 mg Oral Q T,Th,Sa-HD   pantoprazole   40 mg Oral QAC breakfast      Ephriam Stank, MD [facility] 08/30/2024, 8:24 AM

## 2024-08-31 DIAGNOSIS — Z992 Dependence on renal dialysis: Secondary | ICD-10-CM | POA: Diagnosis not present

## 2024-08-31 DIAGNOSIS — N186 End stage renal disease: Secondary | ICD-10-CM | POA: Diagnosis not present

## 2024-08-31 LAB — COMPREHENSIVE METABOLIC PANEL WITH GFR
ALT: 10 U/L (ref 0–44)
AST: 23 U/L (ref 15–41)
Albumin: 3 g/dL — ABNORMAL LOW (ref 3.5–5.0)
Alkaline Phosphatase: 239 U/L — ABNORMAL HIGH (ref 38–126)
Anion gap: 19 — ABNORMAL HIGH (ref 5–15)
BUN: 66 mg/dL — ABNORMAL HIGH (ref 6–20)
CO2: 22 mmol/L (ref 22–32)
Calcium: 7.1 mg/dL — ABNORMAL LOW (ref 8.9–10.3)
Chloride: 94 mmol/L — ABNORMAL LOW (ref 98–111)
Creatinine, Ser: 15 mg/dL — ABNORMAL HIGH (ref 0.44–1.00)
GFR, Estimated: 3 mL/min — ABNORMAL LOW (ref >60)
Glucose, Bld: 87 mg/dL (ref 70–99)
Potassium: 5 mmol/L (ref 3.5–5.1)
Sodium: 135 mmol/L (ref 135–145)
Total Bilirubin: 0.7 mg/dL (ref 0.0–1.2)
Total Protein: 5.4 g/dL — ABNORMAL LOW (ref 6.5–8.1)

## 2024-08-31 LAB — CATH TIP CULTURE: Culture: 100000

## 2024-08-31 LAB — CULTURE, BLOOD (ROUTINE X 2)
Special Requests: ADEQUATE
Special Requests: ADEQUATE

## 2024-08-31 LAB — GLUCOSE, CAPILLARY
Glucose-Capillary: 96 mg/dL (ref 70–99)
Glucose-Capillary: 95 mg/dL (ref 70–99)
Glucose-Capillary: 96 mg/dL (ref 70–99)
Glucose-Capillary: 105 mg/dL — ABNORMAL HIGH (ref 70–99)
Glucose-Capillary: 93 mg/dL (ref 70–99)

## 2024-08-31 LAB — CBC
HCT: 24.3 % — ABNORMAL LOW (ref 36.0–46.0)
Hemoglobin: 7.9 g/dL — ABNORMAL LOW (ref 12.0–15.0)
MCH: 29.2 pg (ref 26.0–34.0)
MCHC: 32.5 g/dL (ref 30.0–36.0)
MCV: 89.7 fL (ref 80.0–100.0)
Platelets: 51 K/uL — ABNORMAL LOW (ref 150–400)
RBC: 2.71 MIL/uL — ABNORMAL LOW (ref 3.87–5.11)
RDW: 16.1 % — ABNORMAL HIGH (ref 11.5–15.5)
WBC: 10.9 K/uL — ABNORMAL HIGH (ref 4.0–10.5)
nRBC: 0 % (ref 0.0–0.2)

## 2024-08-31 LAB — MAGNESIUM: Magnesium: 2.2 mg/dL (ref 1.7–2.4)

## 2024-08-31 LAB — PATHOLOGIST SMEAR REVIEW: Path Review: REACTIVE

## 2024-08-31 LAB — CALCIUM, IONIZED: Calcium, Ionized, Serum: 4.2 mg/dL — ABNORMAL LOW (ref 4.5–5.6)

## 2024-08-31 LAB — PARATHYROID HORMONE, INTACT (NO CA): PTH: 120 pg/mL — ABNORMAL HIGH (ref 15–65)

## 2024-08-31 MED ORDER — OXYCODONE HCL 5 MG PO TABS
5.0000 mg | ORAL_TABLET | ORAL | Status: DC | PRN
  Administered 2024-08-31: 5 mg via ORAL
  Filled 2024-08-31: qty 1

## 2024-08-31 MED ORDER — HYDRALAZINE HCL 20 MG/ML IJ SOLN
10.0000 mg | Freq: Four times a day (QID) | INTRAMUSCULAR | Status: DC | PRN
  Administered 2024-08-31 – 2024-09-01 (×2): 10 mg via INTRAVENOUS
  Filled 2024-08-31 (×2): qty 1

## 2024-08-31 MED ORDER — CEFAZOLIN SODIUM-DEXTROSE 2-4 GM/100ML-% IV SOLN
2.0000 g | INTRAVENOUS | Status: DC
  Administered 2024-08-31: 2 g via INTRAVENOUS
  Filled 2024-08-31: qty 100

## 2024-08-31 NOTE — Progress Notes (Signed)
 PROGRESS NOTE    Cathy [name]  FMW:[id number] DOB: 05-05-1981 DOA: 08/28/2024 PCP: Terry Wilhelmena Lloyd Hilario, FNP   Brief Narrative:    Cathy [name] is a 43 y.o. female with medical history significant of ESRD on hemodialysis, Tuesday, Thursday and Saturday, cirrhosis, depression, lupus, history of pulmonary embolism and bipolar/ depression with presented with altered mental status.  Patient was found down by her family. Unknown for how long patient was on the floor. EMS was called, she wad found confused and febrile, then she was transported to the ED. she was recently discharged on 9/26 after hospitalization for volume overload with hyperkalemia and acidosis and underwent hemodialysis as well as large-volume paracentesis.  She has now been admitted with concerns for acute metabolic encephalopathy secondary to severe sepsis with concerns for SBP and is now noted to have some Staph aureus bacteremia for which ID has recommended TEE, however patient is not stable to have TEE at this point given significant hypocalcemia as well as worsening anemia and thrombocytopenia, now recommendation is for 6 weeks of cefazolin  with hemodialysis.  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)   History of pulmonary embolus   MDD (major depressive disorder)   Sepsis (HCC)  Assessment and Plan:  1)Acute metabolic encephalopathy secondary to severe sepsis due to Staph aureus bacteremia---POA -Initial concern for SBP, however ascitic fluid analysis does not appear concerning and Gram stain negative with cultures showing no growth HD catheter removed 9/29 with tip cultures with MSSA MRSA PCR negative TEE  currently canceled by cardiology given significant hypocalcemia as well as anemia and thrombocytopenia,  Treated with Rocephin -- now ID recommending 6 weeks of cefazolin  with hemodialysis -Initial blood cultures from 08/28/2028 with MSSA bacteremia -Repeat blood blood cultures from 08/29/2024 NGTD -HD catheter removal 08/29/24 -HD catheter tip culture from 08/29/2024 with MSSA -ceFAZolin  (ANCEF ) IVPB -Antibiotic---Inject 2 g into the vein Every Tuesday,Thursday,and Saturday with dialysis. Indication: MSSA bacteremia Last Day of Therapy: 10/10/24 Labs - Once weekly: CBC/D and BMP, ESR and CRP Method of administration: Per HD protocol - to be given at the patient's HD center, Starting Tue 08/30/2024, Until Mon 10/10/2024-   2)End-stage renal disease on hemodialysis [facility]) Appreciate nephrology for dialysis   plan for 6 weeks of Cefazolin  2g/HD-TTS thru 10/10/24 for MSSA with inability to rule out endocarditis - HD catheter removed as above on 08/29/2024 -Left upper extremity AV fistula is functioning well continue to use for HD per TTS schedule   3)Hypocalcemia Possibly due to noncompliance with calcitriol after discussion with nephrology Received IV calcium  gluconate Further evaluation with PTH, vitamin D    4)Anemia of ESRD Hemoglobin > 7 Transfuse for hemoglobin less than 7 Defer decision on Epocrit/Aranesp  to Nephrology service   5)Thrombocytopenia Platelets > 70 K   6)Hypertension-with soft blood pressure readings initially - BP is now running higher, okay to restart BP meds    Chronic systolic CHF (congestive heart failure) (HCC) Volume overload due to renal failure with ESRD, not compliant with hemodialysis and ultrafiltration Last echocardiogram with reduced LV systolic function -Low-salt diet advised, continue hemodialysis sessions to address volume status   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) Follow up as outpatient, no clinical signs of exacerbation   History of pulmonary embolus Patient currently not on anticoagulation--- chronic anemia and chronic thrombocytopenia as above   MDD (major depressive disorder)/anxiety disorder Continue with buspirone  and hydroxyzine     History of seizures Stable without seizures, continue Keppra      DVT prophylaxis:Heparin  discontinued given thrombocytopenia Code Status: Full Family Communication: None at bedside Disposition Plan:  Status is: Inpatient Remains inpatient appropriate because: Need for IV medications   Consultants:  Nephrology ID Cardiology  Procedures:  Dialysis catheter removal 9/29  Antimicrobials:  Anti-infectives (From admission, onward)    Start     Dose/Rate Route Frequency Ordered Stop   08/31/24 1600  ceFAZolin  (ANCEF ) IVPB 2g/100 mL premix        2 g 200 mL/hr over 30 Minutes Intravenous Every M-W-F (Hemodialysis) 08/31/24 0836     08/30/24 0000  cefTRIAXone  (ROCEPHIN ) 2 g in sodium chloride  0.9 % 100 mL IVPB  Status:  Discontinued        2 g 200 mL/hr over 30 Minutes Intravenous Every 24 hours 08/29/24 0029 08/29/24 1026   08/30/24 0000  ceFAZolin  (ANCEF ) IVPB        2 g Intravenous Every T-Th-Sa (Hemodialysis) 08/30/24 1457 10/10/24 2359   08/29/24 2000  ceFAZolin  (ANCEF ) IVPB 1 g/50 mL premix  Status:  Discontinued        1 g 100 mL/hr over 30 Minutes Intravenous Every 24 hours 08/29/24 1026 08/31/24 0836   08/28/24 2245  cefTRIAXone  (ROCEPHIN ) 2 g in sodium chloride  0.9 % 100 mL IVPB        2 g 200 mL/hr over 30 Minutes Intravenous  Once 08/28/24  2234 08/29/24 1956         Subjective:  -undergoing HD on 08/31/2024 Elevated BP noted  No fever  Or chills   No Nausea, Vomiting or Diarrhea  Will try to wean off D10 solution as-oral intake improves and if glucose stabilizes Patient trying to eat more   Objective: Vitals:   08/31/24 1800 08/31/24 1815 08/31/24 1830 08/31/24 1845  BP: (!) 195/112 (!) 194/90 (!) 198/86 (!) 179/88  Pulse:      Resp: 14 19 17 14   Temp:      TempSrc:      SpO2:      Weight:      Height:        Intake/Output Summary (Last 24 hours) at 08/31/2024 1854 Last data filed at 08/31/2024 0616 Gross per 24 hour  Intake 88 ml  Output --  Net 88 ml   Filed Weights   08/29/24 0300 08/29/24 1000 08/29/24 1445  Weight: 81.5 kg 81.5 kg 79 kg    Examination: en:- Awake Alert, no acute distress , coherent  HEENT:- Naplate.AT, No sclera icterus Neck-Supple Neck,No JVD,.  Lungs-  CTAB , good air movement bilaterally CV- S1, S2 normal, regular Abd-  +ve B.Sounds, Abd Soft, No tenderness,    Extremity/Skin:- No  edema,   good pulses Psych-affect is appropriate, oriented x3 Neuro-no new focal deficits, no tremors  MSK-left upper extremity AV fistula with positive bruit and thrill  Data  Reviewed: I have personally reviewed following labs and imaging studies  CBC: Recent Labs  Lab 08/24/24 2240 08/25/24 0340 08/28/24 2043 08/29/24 1539 08/30/24 0514 08/31/24 0507  WBC 5.2 4.8 10.7* 15.0* 11.3* 10.9*  NEUTROABS 4.0  --  9.8*  --   --   --   HGB 8.4* 7.4* 9.0* 8.1* 7.3* 7.9*  HCT 26.5* 24.5* 28.2* 25.2* 22.3* 24.3*  MCV 95.7 96.5 91.9 91.3 90.3 89.7  PLT 87* 79* 71* 70* 50* 51*   Basic Metabolic Panel: Recent Labs  Lab 08/25/24 0340 08/28/24 2043 08/29/24 0349 08/29/24 2323 08/30/24 0514 08/31/24 0507  NA 141 140 138 134* 133* 135  K 5.1 6.1* 5.7* 4.1 4.2 5.0  CL 100 100 102 94* 96* 94*  CO2 22 17* 17* 24 26 22   GLUCOSE 99 64* 149* 76 84 87  BUN 63* 91* 92* 52* 58* 66*  CREATININE  16.42* 20.60* 20.41* 12.96* 12.95* 15.00*  CALCIUM  6.8* 6.4* 5.7* 6.1* 6.3* 7.1*  MG 2.0  --   --   --  1.9 2.2  PHOS 7.9*  --   --   --  6.0*  --    GFR: Estimated Creatinine Clearance: 5.4 mL/min (A) (by C-G formula based on SCr of 15 mg/dL (H)). Liver Function Tests: Recent Labs  Lab 08/24/24 2240 08/28/24 2043 08/29/24 0349 08/30/24 0514 08/31/24 0507  AST 26 61* 73* 25 23  ALT 14 21 26 17 10   ALKPHOS 161* 180* 148* 113 239*  BILITOT 0.7 1.5* 1.1 0.8 0.7  PROT 6.7 5.6* 5.1* 4.7* 5.4*  ALBUMIN  3.2* 2.9* 2.6* 2.2* 3.0*   Recent Labs  Lab 08/28/24 2043  LIPASE 25   Recent Labs  Lab 08/28/24 2043  AMMONIA 24   Coagulation Profile: Recent Labs  Lab 08/25/24 0340 08/28/24 2043  INR 1.2 1.4*   CBG: Recent Labs  Lab 08/30/24 2308 08/31/24 0338 08/31/24 0714 08/31/24 1108 08/31/24 1645  GLUCAP 91 96 95 96 105*   Thyroid  Function Tests: Recent Labs    08/28/24 2043  TSH 2.303   Sepsis Labs: Recent Labs  Lab 08/28/24 2043 08/29/24 2323  LATICACIDVEN 1.4 1.2    Recent Results (from the past 240 hours)  Gram stain     Status: None   Collection Time: 08/26/24 11:43 AM   Specimen: Peritoneal Washings; Peritoneal Fluid  Result Value Ref Range Status   Specimen Description PERITONEAL  Final   Special Requests NONE  Final   Gram Stain   Final    CYTOSPIN SMEAR ASCITIC WBC PRESENT, PREDOMINANTLY MONONUCLEAR NO ORGANISMS SEEN Performed at [facility], [address] [place], [place] [address]    Report Status 08/26/2024 FINAL  Final  Culture, blood (routine x 2)     Status: Abnormal   Collection Time: 08/28/24  8:43 PM   Specimen: BLOOD  Result Value Ref Range Status   Specimen Description   Final    BLOOD RIGHT ANTECUBITAL Performed at [facility], [address], [place], [place] [address]    Special Requests   Final    BOTTLES DRAWN AEROBIC AND ANAEROBIC Blood Culture adequate volume Performed at [facility], [address],  [place], [place] [address]    Culture  Setup Time   Final    GRAM POSITIVE COCCI IN BOTH AEROBIC AND ANAEROBIC BOTTLES Gram Stain Report Called to,Read Back By and Verified With: WOODEN C AT 0627 ON [id number] BY PURDIE J CRITICAL RESULT CALLED TO, READ BACK BY  AND VERIFIED WITH: PHARMD LINDA LONG 1001 [id number] FCP Performed at [facility], [address] [place], [place] [address]    Culture STAPHYLOCOCCUS AUREUS (A)  Final   Report Status 08/31/2024 FINAL  Final   Organism ID, Bacteria STAPHYLOCOCCUS AUREUS  Final      Susceptibility   Staphylococcus aureus - MIC*    CIPROFLOXACIN <=0.5 SENSITIVE Sensitive     ERYTHROMYCIN  <=0.25 SENSITIVE Sensitive     GENTAMICIN 4 SENSITIVE Sensitive     OXACILLIN <=0.25 SENSITIVE Sensitive     TETRACYCLINE <=1 SENSITIVE Sensitive     VANCOMYCIN  1 SENSITIVE Sensitive     TRIMETH /SULFA  <=10 SENSITIVE Sensitive     CLINDAMYCIN <=0.25 SENSITIVE Sensitive     RIFAMPIN <=0.5 SENSITIVE Sensitive     Inducible Clindamycin NEGATIVE Sensitive     LINEZOLID 2 SENSITIVE Sensitive     * STAPHYLOCOCCUS AUREUS  Resp panel by RT-PCR (RSV, Flu A&B, Covid) Anterior Nasal Swab     Status: None   Collection Time: 08/28/24  8:43 PM   Specimen: Anterior Nasal Swab  Result Value Ref Range Status   SARS Coronavirus 2 by RT PCR NEGATIVE NEGATIVE Final    Comment: (NOTE) SARS-CoV-2 target nucleic acids are NOT DETECTED.  The SARS-CoV-2 RNA is generally detectable in upper respiratory specimens during the acute phase of infection. The lowest concentration of SARS-CoV-2 viral copies this assay can detect is 138 copies/mL. A negative result does not preclude SARS-Cov-2 infection and should not be used as the sole basis for treatment or other patient management decisions. A negative result may occur with  improper specimen collection/handling, submission of specimen other than nasopharyngeal swab, presence of viral mutation(s) within the areas targeted by this assay,  and inadequate number of viral copies(<138 copies/mL). A negative result must be combined with clinical observations, patient history, and epidemiological information. The expected result is Negative.  Fact Sheet for Patients:  BloggerCourse.com  Fact Sheet for Healthcare Providers:  SeriousBroker.it  This test is no t yet approved or cleared by the [place]  FDA and  has been authorized for detection and/or diagnosis of SARS-CoV-2 by FDA under an Emergency Use Authorization (EUA). This EUA will remain  in effect (meaning this test can be used) for the duration of the COVID-19 declaration under Section 564(b)(1) of the Act, 21 U.S.C.section 360bbb-3(b)(1), unless the authorization is terminated  or revoked sooner.       Influenza A by PCR NEGATIVE NEGATIVE Final   Influenza B by PCR NEGATIVE NEGATIVE Final    Comment: (NOTE) The Xpert Xpress SARS-CoV-2/FLU/RSV plus assay is intended as an aid in the diagnosis of influenza from Nasopharyngeal swab specimens and should not be used as a sole basis for treatment. Nasal washings and aspirates are unacceptable for Xpert Xpress SARS-CoV-2/FLU/RSV testing.  Fact Sheet for Patients: BloggerCourse.com  Fact Sheet for Healthcare Providers: SeriousBroker.it  This test is not yet approved or cleared by the [place]  FDA and has been authorized for detection and/or diagnosis of SARS-CoV-2 by FDA under an Emergency Use Authorization (EUA). This EUA will remain in effect (meaning this test can be used) for the duration of the COVID-19 declaration under Section 564(b)(1) of the Act, 21 U.S.C. section 360bbb-3(b)(1), unless the authorization is terminated or revoked.     Resp Syncytial Virus by PCR NEGATIVE NEGATIVE Final    Comment: (NOTE) Fact Sheet for Patients: BloggerCourse.com  Fact Sheet for  Healthcare Providers: SeriousBroker.it  This test is not yet approved or  cleared by the [place]  FDA and has been authorized for detection and/or diagnosis of SARS-CoV-2 by FDA under an Emergency Use Authorization (EUA). This EUA will remain in effect (meaning this test can be used) for the duration of the COVID-19 declaration under Section 564(b)(1) of the Act, 21 U.S.C. section 360bbb-3(b)(1), unless the authorization is terminated or revoked.  Performed at [facility], [address] [place], [place] [address]   Blood Culture ID Panel (Reflexed)     Status: Abnormal   Collection Time: 08/28/24  8:43 PM  Result Value Ref Range Status   Enterococcus faecalis NOT DETECTED NOT DETECTED Final   Enterococcus Faecium NOT DETECTED NOT DETECTED Final   Listeria monocytogenes NOT DETECTED NOT DETECTED Final   Staphylococcus species DETECTED (A) NOT DETECTED Final    Comment: CRITICAL RESULT CALLED TO, READ BACK BY AND VERIFIED WITH: PHARMD LINDA LONG 1001 [id number] FCP    Staphylococcus aureus (BCID) DETECTED (A) NOT DETECTED Final    Comment: CRITICAL RESULT CALLED TO, READ BACK BY AND VERIFIED WITH: PHARMD LINDA LONG 1001 [id number] FCP    Staphylococcus epidermidis NOT DETECTED NOT DETECTED Final   Staphylococcus lugdunensis NOT DETECTED NOT DETECTED Final   Streptococcus species NOT DETECTED NOT DETECTED Final   Streptococcus agalactiae NOT DETECTED NOT DETECTED Final   Streptococcus pneumoniae NOT DETECTED NOT DETECTED Final   Streptococcus pyogenes NOT DETECTED NOT DETECTED Final   A.calcoaceticus-baumannii NOT DETECTED NOT DETECTED Final   Bacteroides fragilis NOT DETECTED NOT DETECTED Final   Enterobacterales NOT DETECTED NOT DETECTED Final   Enterobacter cloacae complex NOT DETECTED NOT DETECTED Final   Escherichia coli NOT DETECTED NOT DETECTED Final   Klebsiella aerogenes NOT DETECTED NOT DETECTED Final   Klebsiella oxytoca NOT DETECTED NOT DETECTED  Final   Klebsiella pneumoniae NOT DETECTED NOT DETECTED Final   Proteus species NOT DETECTED NOT DETECTED Final   Salmonella species NOT DETECTED NOT DETECTED Final   Serratia marcescens NOT DETECTED NOT DETECTED Final   Haemophilus influenzae NOT DETECTED NOT DETECTED Final   Neisseria meningitidis NOT DETECTED NOT DETECTED Final   Pseudomonas aeruginosa NOT DETECTED NOT DETECTED Final   Stenotrophomonas maltophilia NOT DETECTED NOT DETECTED Final   Candida albicans NOT DETECTED NOT DETECTED Final   Candida auris NOT DETECTED NOT DETECTED Final   Candida glabrata NOT DETECTED NOT DETECTED Final   Candida krusei NOT DETECTED NOT DETECTED Final   Candida parapsilosis NOT DETECTED NOT DETECTED Final   Candida tropicalis NOT DETECTED NOT DETECTED Final   Cryptococcus neoformans/gattii NOT DETECTED NOT DETECTED Final   Meth resistant mecA/C and MREJ NOT DETECTED NOT DETECTED Final    Comment: Performed at [facility], [address], [place], [place] [address]  Culture, blood (routine x 2)     Status: Abnormal   Collection Time: 08/28/24  9:50 PM   Specimen: BLOOD RIGHT HAND  Result Value Ref Range Status   Specimen Description   Final    BLOOD RIGHT HAND Performed at [facility], [address] [place], [place] [address]    Special Requests   Final    BOTTLES DRAWN AEROBIC AND ANAEROBIC Blood Culture adequate volume Performed at [facility], [address], [place], [place] [address]    Culture  Setup Time   Final    GRAM POSITIVE COCCI IN BOTH AEROBIC AND ANAEROBIC BOTTLES Gram Stain Report Called to,Read Back By and Verified With: G ROOS AT 0727 ON [id number] BY S DALTON GRAM STAIN REVIEWED-AGREE WITH  RESULT DRT    Culture (A)  Final    STAPHYLOCOCCUS AUREUS SUSCEPTIBILITIES PERFORMED ON PREVIOUS CULTURE WITHIN THE LAST 5 DAYS. Performed at [facility], [address], [place], [place] [address]    Report Status 08/31/2024 FINAL  Final  Body fluid culture w Gram  Stain     Status: None (Preliminary result)   Collection Time: 08/28/24 10:39 PM   Specimen: Peritoneal Washings; Body Fluid  Result Value Ref Range Status   Specimen Description   Final    PERITONEAL Performed at [facility], [address] [place], [place] [address]    Special Requests   Final    NONE Performed at [facility], [address], [place], [place] [address]    Gram Stain   Final    NO ORGANISMS SEEN WBC PRESENT, PREDOMINANTLY PMN CYTOSPIN SMEAR Performed at [facility], [address], [place], [place] [address]    Culture   Final    NO GROWTH 2 DAYS Performed at [facility], [address] [place], [place] [address]    Report Status PENDING  Incomplete  MRSA Next Gen by PCR, Nasal     Status: None   Collection Time: 08/29/24  1:04 AM   Specimen: Nasal Mucosa; Nasal Swab  Result Value Ref Range Status   MRSA by PCR Next Gen NOT DETECTED NOT DETECTED Final    Comment: (NOTE) The GeneXpert MRSA Assay (FDA approved for NASAL specimens only), is one component of a comprehensive MRSA colonization surveillance program. It is not intended to diagnose MRSA infection nor to guide or monitor treatment for MRSA infections. Test performance is not FDA approved in patients less than 59 years old. Performed at [facility], [address] [place], [place] [address]   Cath Tip Culture     Status: None   Collection Time: 08/29/24 12:45 PM   Specimen: Urine, Catheterized; Other  Result Value Ref Range Status   Specimen Description   Final    URINE, CATHETERIZED Performed at [facility], [address] [place], [place] [address]    Special Requests   Final    NONE Performed at [facility], [address] [place], [place] [address]    Culture 100000 CFU STAPHYLOCOCCUS AUREUS  Final   Report Status 08/31/2024 FINAL  Final   Organism ID, Bacteria STAPHYLOCOCCUS AUREUS  Final      Susceptibility   Staphylococcus aureus - MIC*    CIPROFLOXACIN <=0.5 SENSITIVE  Sensitive     GENTAMICIN 4 SENSITIVE Sensitive     NITROFURANTOIN  <=16 SENSITIVE Sensitive     OXACILLIN <=0.25 SENSITIVE Sensitive     TETRACYCLINE <=1 SENSITIVE Sensitive     VANCOMYCIN  1 SENSITIVE Sensitive     TRIMETH /SULFA  <=10 SENSITIVE Sensitive     CLINDAMYCIN <=0.25 SENSITIVE Sensitive     RIFAMPIN <=0.5 SENSITIVE Sensitive     Inducible Clindamycin NEGATIVE Sensitive     LINEZOLID 2 SENSITIVE Sensitive     * 100000 CFU STAPHYLOCOCCUS AUREUS  Culture, blood (Routine X 2) w Reflex to ID Panel     Status: None (Preliminary result)   Collection Time: 08/29/24  3:39 PM   Specimen: BLOOD  Result Value Ref Range Status   Specimen Description BLOOD RIGHT ANTECUBITAL  Final   Special Requests   Final    BOTTLES DRAWN AEROBIC ONLY Blood Culture adequate volume   Culture   Final    NO GROWTH 2 DAYS Performed at [facility], [address] [place], [place] [address]    Report Status PENDING  Incomplete  Culture, blood (Routine X 2) w Reflex to ID Panel     Status: None (Preliminary result)   Collection Time: 08/29/24  3:45 PM   Specimen: BLOOD  Result Value Ref Range Status   Specimen Description BLOOD RIGHT ANTECUBITAL  Final   Special Requests   Final    BOTTLES DRAWN AEROBIC ONLY Blood Culture results may not be optimal due to an inadequate volume of blood received in culture bottles   Culture   Final    NO GROWTH 2 DAYS Performed at [facility], [address], [place], [place] [address]    Report Status PENDING  Incomplete     Scheduled Meds:  aspirin   81 mg Oral QHS   busPIRone   7.5 mg Oral BID   calcitRIOL  0.5 mcg Oral Q M,W,F-HD   Chlorhexidine  Gluconate Cloth  6 each Topical Q0600   darbepoetin (ARANESP ) injection - DIALYSIS  100 mcg Subcutaneous Q Wed-1800   gabapentin   300 mg Oral QHS   levETIRAcetam   500 mg Oral Daily   And   levETIRAcetam   250 mg Oral Q T,Th,Sa-HD   levETIRAcetam   250 mg Oral Q M,W,F-HD   pantoprazole   40 mg Oral QAC breakfast    Continuous Infusions:  anticoagulant sodium citrate       ceFAZolin  (ANCEF ) IV     dextrose  Stopped (08/30/24 0831)     LOS: 3 days   Rendall Carwin, MD [facility]  If 7PM-7AM, please contact night-coverage [email or web address] 08/31/2024, 6:54 PM

## 2024-08-31 NOTE — Progress Notes (Signed)
 [facility] Progress Note    Outpatient HD orders (per recent admission):  [facility] on TTS (schedule recently changed) . EDW 69 kg HD Bath 1K/2.5Ca, Na 138 Time 4:00 Heparin  500 bolus then 500 units/hr. Access LUE AVF BFR 400 DFR 600, 15g 1in, Nipro 17H, Temp 37  Assessment/ Plan:   ESRD   -HD today and then maintain MWF, can transition back to TTS on discharge or later during admit. Certainly still with volume onboard.  Catheter ON Monday.   Sepsis secondary to SBP/gram positive bacteremia (Staph aureus) -abx/mgmt per primary service & ID--currently receiving ancef  -TDC removed Monday -TEE pending  - plan for 6 weeks of Cefazolin  2g/HD-TTS thru 10/10/24    Hyperkalemia -improved. Managing with HD   Volume/ hypertension  -EDW 69kg. UF as tolerated; still w/  significant edema in the lower extremities -anti-HTNs currently on hold given high risk for hypotension   Chronic Systolic CHF -UF as tolerated with HD   Cirrhosis -s/p diagnostic paracentesis -per primary service   Anemia of Chronic Kidney Disease Hemoglobin 7.3  -Avoid IV iron  in the context of bacteremia -Transfuse PRN for Hgb <7 -checking Fe panel. Starting aranesp  100mcg qwed's   Secondary Hyperparathyroidism/Hyperphosphatemia - resume home meds, monitor phos (will recheck on Friday) -cca ~7.7, started on calcitriol 0.5 MWF with HD  Vascular access -TDC removed by gen surg on 9/29 given bacteremia. AVF is functional with a good bruit    Subjective:   Patient seen and examined bedside. Net uf 2.5L with HD Monday. Has been hypoglycemic therefore running on d10. She is alert today and cooperative. Denies any chest pain, SOB, abdominal pain, nausea and has an appetite.   Objective:   BP 136/73   Pulse 64   Temp 98.5 F (36.9 C) (Oral)   Resp 14   Ht 5' 7 (1.702 m)   Wt 79 kg   LMP 04/07/2020 (Within Months)   SpO2 98%   BMI 27.28 kg/m   Intake/Output Summary (Last 24  hours) at 08/31/2024 0830 Last data filed at 08/31/2024 9383 Gross per 24 hour  Intake 88 ml  Output --  Net 88 ml   Weight change:   Physical Exam: Gen: ill appearing CVS: RRR Resp: CTA BL Abd: distended, soft, NT Ext: no sig edema b/l Les Neuro: awake but drowsy, following some commands Dialysis access: LUE AVF +b/t  Imaging: ECHOCARDIOGRAM COMPLETE Result Date: 08/30/2024    ECHOCARDIOGRAM REPORT   Patient Name:   KATERIA CUTRONA Date of Exam: 08/29/2024 Medical Rec #:  [id number]        Height:       67.0 in Accession #:    [id number]       Weight:       174.2 lb Date of Birth:  Jun 21, 1981       BSA:          1.907 m Patient Age:    42 years         BP:           106/53 mmHg Patient Gender: F                HR:           66 bpm. Exam Location:  [facility] Procedure: 2D Echo, Cardiac Doppler, Color Doppler and Strain Analysis (Both            Spectral and Color Flow Doppler were utilized during procedure). Indications:    Bacteremia R78.81  History:        Patient has prior history of Echocardiogram examinations, most                 recent 02/03/2022. CHF; Risk Factors:Hypertension and Tobacco                 abuse. Hx of End-stage renal disease on hemodialysis (HCC),                 HFrEF (heart failure with reduced ejection fraction) (HCC).  Sonographer:    Aida Pizza RCS Referring Phys: [id number] PRATIK D [facility]  Sonographer Comments: Global longitudinal strain was attempted. IMPRESSIONS  1. Left ventricular ejection fraction, by estimation, is 60 to 65%. The left ventricle has normal function. The left ventricle has no regional wall motion abnormalities. There is moderate left ventricular hypertrophy. Left ventricular diastolic parameters are indeterminate. The average left ventricular global longitudinal strain is -11.1 %.  2. Right ventricular systolic function is moderately reduced. The right ventricular size is moderately enlarged.  3. Left atrial size was moderately dilated.  4. Right  atrial size was moderately dilated.  5. The mitral valve is normal in structure. Mild mitral valve regurgitation. No evidence of mitral stenosis.  6. Tricuspid valve regurgitation is moderate to severe.  7. The aortic valve has an indeterminant number of cusps. Aortic valve regurgitation is mild. Mild aortic valve stenosis. Increased aortic valve velocities likely secondary to increased LVOT velocity. Aortic valve area, by VTI measures 1.68 cm. Aortic valve mean gradient measures 14.0 mmHg. Aortic valve Vmax measures 2.57 m/s.  8. The inferior vena cava is dilated but IVC collapsibility could not be evaluated.  9. Increased flow velocities may be secondary to anemia, thyrotoxicosis, hyperdynamic or high flow state. FINDINGS  Left Ventricle: Left ventricular ejection fraction, by estimation, is 60 to 65%. The left ventricle has normal function. The left ventricle has no regional wall motion abnormalities. The average left ventricular global longitudinal strain is -11.1 %. Strain was performed and the global longitudinal strain is indeterminate. The left ventricular internal cavity size was normal in size. There is moderate left ventricular hypertrophy. Left ventricular diastolic parameters are indeterminate. Right Ventricle: The right ventricular size is moderately enlarged. No increase in right ventricular wall thickness. Right ventricular systolic function is moderately reduced. Left Atrium: Left atrial size was moderately dilated. Right Atrium: Right atrial size was moderately dilated. Pericardium: There is no evidence of pericardial effusion. Mitral Valve: The mitral valve is normal in structure. Mild mitral valve regurgitation. No evidence of mitral valve stenosis. Tricuspid Valve: The tricuspid valve is normal in structure. Tricuspid valve regurgitation is moderate to severe. No evidence of tricuspid stenosis. Aortic Valve: The aortic valve has an indeterminant number of cusps. Aortic valve regurgitation is  mild. Mild aortic stenosis is present. Aortic valve mean gradient measures 14.0 mmHg. Aortic valve peak gradient measures 26.4 mmHg. Aortic valve area, by VTI measures 1.68 cm. Pulmonic Valve: The pulmonic valve was normal in structure. Pulmonic valve regurgitation is mild. No evidence of pulmonic stenosis. Aorta: The aortic root is normal in size and structure. Venous: The inferior vena cava is dilated but IVC collapsibility could not be evaluated. IAS/Shunts: No atrial level shunt detected by color flow Doppler. Additional Comments: 3D was performed not requiring image post processing on an independent workstation and was indeterminate.  LEFT VENTRICLE PLAX 2D LVIDd:         4.90 cm   Diastology LVIDs:  3.00 cm   LV e' medial:    8.08 cm/s LV PW:         1.40 cm   LV E/e' medial:  17.3 LV IVS:        1.30 cm   LV e' lateral:   12.20 cm/s LVOT diam:     1.80 cm   LV E/e' lateral: 11.5 LV SV:         76 LV SV Index:   40        2D Longitudinal Strain LVOT Area:     2.54 cm  2D Strain GLS Avg:     -11.1 %  RIGHT VENTRICLE RV S prime:     16.40 cm/s TAPSE (M-mode): 2.7 cm LEFT ATRIUM             Index        RIGHT ATRIUM           Index LA diam:        3.40 cm 1.78 cm/m   RA Area:     23.10 cm LA Vol (A2C):   88.1 ml 46.20 ml/m  RA Volume:   68.00 ml  35.66 ml/m LA Vol (A4C):   77.3 ml 40.54 ml/m LA Biplane Vol: 82.4 ml 43.21 ml/m  AORTIC VALVE AV Area (Vmax):    1.95 cm AV Area (Vmean):   1.80 cm AV Area (VTI):     1.68 cm AV Vmax:           257.00 cm/s AV Vmean:          171.000 cm/s AV VTI:            0.450 m AV Peak Grad:      26.4 mmHg AV Mean Grad:      14.0 mmHg LVOT Vmax:         197.00 cm/s LVOT Vmean:        121.000 cm/s LVOT VTI:          0.297 m LVOT/AV VTI ratio: 0.66  AORTA Ao Root diam: 3.10 cm MITRAL VALVE                TRICUSPID VALVE MV Area (PHT): 2.00 cm     TR Peak grad:   26.8 mmHg MV Decel Time: 380 msec     TR Vmax:        259.00 cm/s MV E velocity: 140.00 cm/s MV A  velocity: 94.00 cm/s   SHUNTS MV E/A ratio:  1.49         Systemic VTI:  0.30 m                             Systemic Diam: 1.80 cm Vishnu Priya Mallipeddi Electronically signed by Diannah Late Mallipeddi Signature Date/Time: 08/30/2024/9:30:31 AM    Final    US  ASCITES (ABDOMEN LIMITED) Result Date: 08/29/2024 EXAM: LIMITED ABDOMINAL ULTRASOUND FOR ASCITES EVALUATION TECHNIQUE: Limited real-time sonography of all 4 quadrants of the abdomen was performed for evaluation of ascites. COMPARISON: None. CLINICAL HISTORY: Ascites. FINDINGS: RIGHT LOWER QUADRANT: Moderate ascites seen. Largest pocket measures 10.7 cm. LEFT LOWER QUADRANT: Moderate ascites seen. OTHER: Limited visualization of the rest of the abdomen demonstrates no other acute abnormality. IMPRESSION: 1. Scattered moderate ascites with the largest pocket in the right lower quadrant measuring up to 10.7 cm. Electronically signed by: Katheleen Faes MD 08/29/2024 03:55 PM EDT RP Workstation: [id number]    Labs: BMET  Recent Labs  Lab 08/24/24 2240 08/25/24 0340 08/28/24 2043 08/29/24 0349 08/29/24 2323 08/30/24 0514 08/31/24 0507  NA 142 141 140 138 134* 133* 135  K 5.4* 5.1 6.1* 5.7* 4.1 4.2 5.0  CL 101 100 100 102 94* 96* 94*  CO2 21* 22 17* 17* 24 26 22   GLUCOSE 86 99 64* 149* 76 84 87  BUN 64* 63* 91* 92* 52* 58* 66*  CREATININE 16.29* 16.42* 20.60* 20.41* 12.96* 12.95* 15.00*  CALCIUM  6.9* 6.8* 6.4* 5.7* 6.1* 6.3* 7.1*  PHOS  --  7.9*  --   --   --  6.0*  --    CBC Recent Labs  Lab 08/24/24 2240 08/25/24 0340 08/28/24 2043 08/29/24 1539 08/30/24 0514 08/31/24 0507  WBC 5.2   < > 10.7* 15.0* 11.3* 10.9*  NEUTROABS 4.0  --  9.8*  --   --   --   HGB 8.4*   < > 9.0* 8.1* 7.3* 7.9*  HCT 26.5*   < > 28.2* 25.2* 22.3* 24.3*  MCV 95.7   < > 91.9 91.3 90.3 89.7  PLT 87*   < > 71* 70* 50* 51*   < > = values in this interval not displayed.    Medications:     aspirin   81 mg Oral QHS   busPIRone   7.5 mg Oral BID    calcitRIOL  0.5 mcg Oral Q M,W,F-HD   Chlorhexidine  Gluconate Cloth  6 each Topical Q0600   darbepoetin (ARANESP ) injection - DIALYSIS  100 mcg Subcutaneous Q Wed-1800   gabapentin   300 mg Oral QHS   levETIRAcetam   500 mg Oral Daily   And   levETIRAcetam   250 mg Oral Q T,Th,Sa-HD   levETIRAcetam   250 mg Oral Q M,W,F-HD   pantoprazole   40 mg Oral QAC breakfast

## 2024-08-31 NOTE — Progress Notes (Signed)
 Contacted out-pt HD clinic, [facility] , to inform and gives heads up that pt will be receiving Cefazolin  2g/HD-TTS thru 10/10/24, staff say they will order this for the out-pt unit. Informed clinic that [facility] was removed 9/29, they are very appreciative. Will continue to assist as needed.   Lavanda Alvan [facility] Dialysis Navigator [phone]

## 2024-08-31 NOTE — Procedures (Signed)
 HD Note:  Some information was entered later than the data was gathered due to patient care needs. The stated time with the data is accurate.  Patient treatment completed at bedside.   Alert and oriented.   Informed consent signed and in chart.   Access used: Upper left arm fistula Access issues: None  Patient's BP remained elevated during treatment. Patient nurse and MD, Dr. Rendall, notified.  No new orders.   TX duration: 3.75 hours  Alert, without acute distress.  Total UF removed: 2500 ml  Hand-off given to patient's nurse.    Freddy Spadafora L. Lenon, RN [facility] D[facility] Unit.

## 2024-08-31 NOTE — Plan of Care (Signed)
  Problem: Education: Goal: Knowledge of General Education information will improve Description: Including pain rating scale, medication(s)/side effects and non-pharmacologic comfort measures Outcome: Progressing   Problem: Health Behavior/Discharge Planning: Goal: Ability to manage health-related needs will improve Outcome: Progressing   Problem: Clinical Measurements: Goal: Ability to maintain clinical measurements within normal limits will improve Outcome: Progressing Goal: Will remain free from infection Outcome: Progressing Goal: Diagnostic test results will improve Outcome: Progressing Goal: Respiratory complications will improve Outcome: Progressing Goal: Cardiovascular complication will be avoided Outcome: Progressing   Problem: Activity: Goal: Risk for activity intolerance will decrease Outcome: Progressing   Problem: Nutrition: Goal: Adequate nutrition will be maintained Outcome: Progressing   Problem: Coping: Goal: Level of anxiety will decrease Outcome: Progressing   Problem: Elimination: Goal: Will not experience complications related to bowel motility Outcome: Progressing Goal: Will not experience complications related to urinary retention Outcome: Progressing   Problem: Pain Managment: Goal: General experience of comfort will improve and/or be controlled Outcome: Progressing   Problem: Safety: Goal: Ability to remain free from injury will improve Outcome: Progressing   Problem: Skin Integrity: Goal: Risk for impaired skin integrity will decrease Outcome: Progressing   Problem: Education: Goal: Ability to describe self-care measures that may prevent or decrease complications (Diabetes Survival Skills Education) will improve Outcome: Progressing Goal: Individualized Educational Video(s) Outcome: Progressing   Problem: Coping: Goal: Ability to adjust to condition or change in health will improve Outcome: Progressing   Problem: Fluid  Volume: Goal: Ability to maintain a balanced intake and output will improve Outcome: Progressing   Problem: Health Behavior/Discharge Planning: Goal: Ability to identify and utilize available resources and services will improve Outcome: Progressing Goal: Ability to manage health-related needs will improve Outcome: Progressing   Problem: Metabolic: Goal: Ability to maintain appropriate glucose levels will improve Outcome: Not Progressing   Problem: Nutritional: Goal: Maintenance of adequate nutrition will improve Outcome: Progressing Goal: Progress toward achieving an optimal weight will improve Outcome: Progressing   Problem: Skin Integrity: Goal: Risk for impaired skin integrity will decrease Outcome: Progressing   Problem: Tissue Perfusion: Goal: Adequacy of tissue perfusion will improve Outcome: Progressing   Problem: Education: Goal: Knowledge of disease and its progression will improve Outcome: Progressing Goal: Individualized Educational Video(s) Outcome: Progressing   Problem: Fluid Volume: Goal: Compliance with measures to maintain balanced fluid volume will improve Outcome: Progressing   Problem: Health Behavior/Discharge Planning: Goal: Ability to manage health-related needs will improve Outcome: Progressing   Problem: Nutritional: Goal: Ability to make healthy dietary choices will improve Outcome: Progressing   Problem: Clinical Measurements: Goal: Complications related to the disease process, condition or treatment will be avoided or minimized Outcome: Progressing   Problem: Education: Goal: Ability to demonstrate management of disease process will improve Outcome: Progressing Goal: Ability to verbalize understanding of medication therapies will improve Outcome: Progressing Goal: Individualized Educational Video(s) Outcome: Progressing   Problem: Activity: Goal: Capacity to carry out activities will improve Outcome: Progressing   Problem:  Cardiac: Goal: Ability to achieve and maintain adequate cardiopulmonary perfusion will improve Outcome: Progressing

## 2024-08-31 NOTE — TOC Progression Note (Signed)
 Transition of Care [facility]) - Progression Note    Patient Details  Name: Cathy [name] MRN: [id number] Date of Birth: Apr 25, 1981  Transition of Care [facility]) CM/SW Contact  Sharlyne Stabs, RN Phone Number: 08/31/2024, 11:07 AM  Clinical Narrative:   Inpatient Care manager following, Updated HD Navigator, patient will need IV abx for 6 weeks. Discharge unknown, IPCM following.     Expected Discharge Plan: Home w Home Health Services Barriers to Discharge: Continued Medical Work up      Expected Discharge Plan and Services             HOME    Social Drivers of Health (SDOH) Interventions SDOH Screenings   Food Insecurity: No Food Insecurity (08/25/2024)  Housing: Low Risk  (08/25/2024)  Transportation Needs: No Transportation Needs (08/25/2024)  Utilities: Not At Risk (08/25/2024)  Alcohol Screen: Low Risk  (02/18/2024)  Depression (PHQ2-9): High Risk (06/16/2024)  Financial Resource Strain: Low Risk  (02/18/2024)  Physical Activity: Inactive (02/18/2024)  Social Connections: Socially Isolated (02/18/2024)  Stress: Stress Concern Present (02/18/2024)  Tobacco Use: High Risk (08/29/2024)    Readmission Risk Interventions    08/29/2024    4:06 PM 05/31/2024    1:09 PM 08/11/2022   12:50 PM  Readmission Risk Prevention Plan  Transportation Screening Complete Complete Complete  Medication Review Oceanographer) Complete Complete Complete  PCP or Specialist appointment within 3-5 days of discharge  Not Complete   HRI or Home Care Consult Not Complete Complete Complete  SW Recovery Care/Counseling Consult Not Complete Complete Complete  Palliative Care Screening Not Applicable Not Applicable Not Applicable  Skilled Nursing Facility Not Applicable Not Applicable Not Applicable

## 2024-09-01 ENCOUNTER — Other Ambulatory Visit: Payer: Self-pay | Admitting: Family Medicine

## 2024-09-01 ENCOUNTER — Telehealth: Payer: Self-pay | Admitting: Family Medicine

## 2024-09-01 DIAGNOSIS — N186 End stage renal disease: Secondary | ICD-10-CM | POA: Diagnosis not present

## 2024-09-01 DIAGNOSIS — Z992 Dependence on renal dialysis: Secondary | ICD-10-CM | POA: Diagnosis not present

## 2024-09-01 LAB — RENAL FUNCTION PANEL
Albumin: 3.2 g/dL — ABNORMAL LOW (ref 3.5–5.0)
Anion gap: 16 — ABNORMAL HIGH (ref 5–15)
BUN: 37 mg/dL — ABNORMAL HIGH (ref 6–20)
CO2: 26 mmol/L (ref 22–32)
Calcium: 7.5 mg/dL — ABNORMAL LOW (ref 8.9–10.3)
Chloride: 94 mmol/L — ABNORMAL LOW (ref 98–111)
Creatinine, Ser: 10 mg/dL — ABNORMAL HIGH (ref 0.44–1.00)
GFR, Estimated: 5 mL/min — ABNORMAL LOW (ref >60)
Glucose, Bld: 84 mg/dL (ref 70–99)
Phosphorus: 3.5 mg/dL (ref 2.5–4.6)
Potassium: 4 mmol/L (ref 3.5–5.1)
Sodium: 136 mmol/L (ref 135–145)

## 2024-09-01 LAB — CBC
HCT: 23.8 % — ABNORMAL LOW (ref 36.0–46.0)
Hemoglobin: 7.8 g/dL — ABNORMAL LOW (ref 12.0–15.0)
MCH: 29.2 pg (ref 26.0–34.0)
MCHC: 32.8 g/dL (ref 30.0–36.0)
MCV: 89.1 fL (ref 80.0–100.0)
Platelets: 71 K/uL — ABNORMAL LOW (ref 150–400)
RBC: 2.67 MIL/uL — ABNORMAL LOW (ref 3.87–5.11)
RDW: 16.1 % — ABNORMAL HIGH (ref 11.5–15.5)
WBC: 10.5 K/uL (ref 4.0–10.5)
nRBC: 0 % (ref 0.0–0.2)

## 2024-09-01 LAB — BODY FLUID CULTURE W GRAM STAIN
Culture: NO GROWTH
Gram Stain: NONE SEEN

## 2024-09-01 LAB — GLUCOSE, CAPILLARY
Glucose-Capillary: 101 mg/dL — ABNORMAL HIGH (ref 70–99)
Glucose-Capillary: 105 mg/dL — ABNORMAL HIGH (ref 70–99)
Glucose-Capillary: 81 mg/dL (ref 70–99)
Glucose-Capillary: 90 mg/dL (ref 70–99)

## 2024-09-01 MED ORDER — ATORVASTATIN CALCIUM 40 MG PO TABS: 40.0000 mg | ORAL_TABLET | Freq: Every day | ORAL | 3 refills | Status: DC

## 2024-09-01 MED ORDER — GABAPENTIN 300 MG PO CAPS: 300.0000 mg | ORAL_CAPSULE | Freq: Every day | ORAL | 3 refills | Status: DC

## 2024-09-01 MED ORDER — LEVETIRACETAM 250 MG PO TABS: 250.0000 mg | ORAL_TABLET | ORAL | 11 refills | Status: DC

## 2024-09-01 MED ORDER — LOSARTAN POTASSIUM 100 MG PO TABS: 100.0000 mg | ORAL_TABLET | Freq: Every day | ORAL | 3 refills | Status: DC

## 2024-09-01 MED ORDER — ASPIRIN 81 MG PO CHEW: 81.0000 mg | CHEWABLE_TABLET | Freq: Every day | ORAL | 5 refills | Status: DC

## 2024-09-01 MED ORDER — CARVEDILOL 12.5 MG PO TABS: 12.5000 mg | ORAL_TABLET | Freq: Two times a day (BID) | ORAL | 3 refills | Status: DC

## 2024-09-01 MED ORDER — ACETAMINOPHEN 325 MG PO TABS: 650.0000 mg | ORAL_TABLET | Freq: Four times a day (QID) | ORAL | Status: DC | PRN

## 2024-09-01 MED ORDER — HYDROXYZINE HCL 25 MG PO TABS: 25.0000 mg | ORAL_TABLET | Freq: Two times a day (BID) | ORAL | 3 refills | Status: DC | PRN

## 2024-09-01 MED ORDER — HYDRALAZINE HCL 100 MG PO TABS: 100.0000 mg | ORAL_TABLET | Freq: Three times a day (TID) | ORAL | 3 refills | Status: DC

## 2024-09-01 MED ORDER — AMLODIPINE BESYLATE 10 MG PO TABS: 10.0000 mg | ORAL_TABLET | Freq: Every morning | ORAL | 3 refills | Status: DC

## 2024-09-01 MED ORDER — ALBUTEROL SULFATE HFA 108 (90 BASE) MCG/ACT IN AERS: 2.0000 | INHALATION_SPRAY | Freq: Four times a day (QID) | RESPIRATORY_TRACT | 2 refills | Status: DC | PRN

## 2024-09-01 MED ORDER — CLONIDINE HCL 0.1 MG PO TABS: 0.1000 mg | ORAL_TABLET | Freq: Two times a day (BID) | ORAL | 5 refills | Status: DC

## 2024-09-01 NOTE — Discharge Instructions (Addendum)
 1)ceFAZolin  (ANCEF ) IVPB -Antibiotic---Inject 2 g into the vein Every Tuesday,Thursday,and Saturday with dialysis. Indication: MSSA bacteremia Last Day of Therapy: 10/10/24 Labs - Once weekly: CBC/D and BMP, ESR and CRP Method of administration: Per HD protocol - to be given at the patient's HD center, Starting Tue 08/30/2024, Until Mon 10/10/2024--  2)follow up with infectious disease specialist Dr. Overton Camp in about 4 to 5 weeks  3)Very Low-salt diet advised---Less than 2 gm of Sodium per day advised----ok to use Mrs DASH salt substitute instead of Salt  4)Weigh yourself daily, call if you gain more than 3 pounds in 1 day or more than 5 pounds in 1 week as your hemodialysis schedule and frequently may need to be adjusted  5)Limit your Fluid  intake to no more than 50 ounces (1.5 Liters) per day  6)Avoid ibuprofen /Advil /Aleve /Motrin Josefine Powders/Naproxen /BC powders/Meloxicam/Diclofenac/Indomethacin and other Nonsteroidal anti-inflammatory medications as these will make you more likely to bleed and can cause stomach ulcers, can also cause Kidney problems.

## 2024-09-01 NOTE — Discharge Summary (Addendum)
 Cathy [name], is a 43 y.o. female  DOB 1981/11/16  MRN [id number].  Admission date:  08/28/2024  Admitting Physician  Cathy Toribio Furnace, MD  Discharge Date:  09/01/2024   Primary MD  Cathy Wilhelmena Lloyd Sola, FNP  Recommendations for primary care physician for things to follow:  1)ceFAZolin  (ANCEF ) IVPB -Antibiotic---Inject 2 g into the vein Every Tuesday,Thursday,and Saturday with dialysis. Indication: MSSA bacteremia Last Day of Therapy: 10/10/24 Labs - Once weekly: CBC/D and BMP, ESR and CRP Method of administration: Per HD protocol - to be given at the patient's HD center, Starting Tue 08/30/2024, Until Mon 10/10/2024--  2)follow up with infectious disease specialist Dr. Overton [name] in about 4 to 5 weeks  3)Very Low-salt diet advised---Less than 2 gm of Sodium per day advised----ok to use Mrs DASH salt substitute instead of Salt  4)Weigh yourself daily, call if you gain more than 3 pounds in 1 day or more than 5 pounds in 1 week as your hemodialysis schedule and frequently may need to be adjusted  5)Limit your Fluid  intake to no more than 50 ounces (1.5 Liters) per day  6)Avoid ibuprofen /Advil /Aleve /Motrin Cathy [name]/Naproxen /BC [name]/Meloxicam/Diclofenac/Indomethacin and other Nonsteroidal anti-inflammatory medications as these will make you more likely to bleed and can cause stomach ulcers, can also cause Kidney problems.   Admission Diagnosis  Hyperkalemia [E87.5] Hypoglycemia [E16.2] SBP (spontaneous bacterial peritonitis) (HCC) [K65.2] Sepsis, due to unspecified organism, unspecified whether acute organ dysfunction present [facility]) [A41.9]   Discharge Diagnosis  Hyperkalemia [E87.5] Hypoglycemia [E16.2] SBP (spontaneous bacterial peritonitis) (HCC) [K65.2] Sepsis, due to unspecified organism, unspecified whether acute organ dysfunction present [facility]) [A41.9]    Principal Problem:    End-stage renal disease on hemodialysis (HCC) Active Problems:   SBP (spontaneous bacterial peritonitis) (HCC)   Hypertension   Chronic systolic CHF (congestive heart failure) (HCC)   Cirrhosis of liver with ascites (HCC)   Hypoglycemia   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)   History of pulmonary embolus   MDD (major depressive disorder)   Sepsis (HCC)      Past Medical History:  Diagnosis Date   Anasarca associated with disorder of kidney 06/08/2021   Anxiety    Asthma    Bipolar depression (HCC)    Cirrhosis (HCC)    Depression    Dyspnea    ESRD (end stage renal disease) on dialysis (HCC) 10/2020   GERD (gastroesophageal reflux disease)    Gonorrhea    Lupus    Pelvic inflammatory disease (PID)    Pleural effusion 06/08/2021   Pulmonary embolism (HCC) 01/31/2022   Renal hypertension    Trichomonas infection     Past Surgical History:  Procedure Laterality Date   A/V FISTULAGRAM N/A 02/22/2024   Procedure: A/V Fistulagram;  Surgeon: Sheree Penne Bruckner, MD;  Location: [facility] INVASIVE CV LAB;  Service: Cardiovascular;  Laterality: N/A;   AV FISTULA PLACEMENT Left 06/10/2022   Procedure: LEFT ARM ARTERIOVENOUS (AV) FISTULA CREATION;  Surgeon: Oris Krystal FALCON, MD;  Location: [facility] ORS;  Service: Vascular;  Laterality: Left;   BASCILIC VEIN TRANSPOSITION Left 03/24/2023   Procedure: LEFT ARM SECOND STAGE BASILIC VEIN TRANSPOSITION;  Surgeon: Oris Krystal FALCON, MD;  Location: [facility] ORS;  Service: Vascular;  Laterality: Left;   CENTRAL LINE INSERTION-TUNNELED N/A 02/22/2024   Procedure: CENTRAL LINE INSERTION-TUNNELED;  Surgeon: Sheree Penne Bruckner, MD;  Location: [facility] INVASIVE CV LAB;  Service: Cardiovascular;  Laterality: N/A;   ESOPHAGEAL DILATION N/A 05/10/2024   Procedure: DILATION, ESOPHAGUS;  Surgeon: Eartha Angelia Sieving, MD;  Location: [facility] ENDO SUITE;  Service: Gastroenterology;  Laterality: N/A;   ESOPHAGOGASTRODUODENOSCOPY N/A 05/10/2024    Procedure: EGD (ESOPHAGOGASTRODUODENOSCOPY);  Surgeon: Eartha Angelia, Sieving, MD;  Location: [facility] ENDO SUITE;  Service: Gastroenterology;  Laterality: N/A;  8:45 am, asa 3  dialysis M,W & F   IR FLUORO GUIDE CV LINE RIGHT  11/30/2020   IR FLUORO GUIDE CV LINE RIGHT  06/13/2021   IR FLUORO GUIDE CV LINE RIGHT  02/01/2022   IR REMOVAL TUN CV CATH W/O FL  06/11/2021   IR REMOVAL TUN CV CATH W/O FL  01/29/2022   IR US  GUIDE VASC ACCESS RIGHT  11/30/2020   IR US  GUIDE VASC ACCESS RIGHT  06/13/2021   IR US  GUIDE VASC ACCESS RIGHT  02/03/2022   RENAL BIOPSY     TEE WITHOUT CARDIOVERSION N/A 06/13/2021   Procedure: TRANSESOPHAGEAL ECHOCARDIOGRAM (TEE);  Surgeon: Delford Maude BROCKS, MD;  Location: [facility] ENDOSCOPY;  Service: Cardiovascular;  Laterality: N/A;   TEE WITHOUT CARDIOVERSION N/A 02/03/2022   Procedure: TRANSESOPHAGEAL ECHOCARDIOGRAM (TEE);  Surgeon: Tobb, Kardie, DO;  Location: [facility] ENDOSCOPY;  Service: Cardiovascular;  Laterality: N/A;   TUBAL LIGATION N/A 02/03/2019   Procedure: POST PARTUM TUBAL LIGATION;  Surgeon: Izell Harari, MD;  Location: [facility] LD ORS;  Service: Gynecology;  Laterality: N/A;     HPI  from the history and physical done on the day of admission:  HPI: Cathy [name] is a 43 y.o. female with medical history significant of ESRD on hemodialysis, Tuesday, Thursday and Saturday, cirrhosis, depression, lupus, history of pulmonary embolism and bipolar/ depression with presented with altered mental status.  Patient was found down by her family. Unknown for how long patient was on the floor. EMS was called, she wad found confused and febrile, then she was transported to the ED.  On direct questioning she mentions falling down while trying to go the bathroom, unclear if she loss her consciousness or had a head trauma.  Reports having abdominal pain since this morning, constant, severe in intensity and associated with abdominal distention.  Currently she is having thirst, no nausea or vomiting.     Recent hospitalization 09/24 to 08/26/24 for volume overload, hyperkalemia and acidosis. She underwent hemodialysis with ultrafiltration with improvement in her symptoms. HD access is left upper extremity fistula.  She required large volume paracentesis 5,9 L.  Apparently her next HD was scheduled on 08/27/24 but not clear if she went to the outpatient HD unit.    Limited history due to patients somnolence and confusion.     Review of Systems: unable to review all systems due to the inability of the patient to answer questions.   Hospital Course:   Assessment and Plan:  1)Acute metabolic encephalopathy secondary to severe sepsis due to Staph aureus bacteremia---POA -Initial concern for SBP, however ascitic fluid analysis does not appear concerning and Gram stain negative with cultures showing no growth HD catheter removed 9/29 with tip cultures with MSSA MRSA PCR negative TEE currently canceled by cardiology on  9/30 given significant hypocalcemia as well as anemia and thrombocytopenia,  Treated with Rocephin -- now ID recommending 6 weeks of cefazolin  with hemodialysis -Initial blood cultures from 08/28/2028 with MSSA bacteremia -Repeat blood blood cultures from 08/29/2024 NGTD -HD catheter removal 08/29/24 -HD catheter tip culture from 08/29/2024 with MSSA -ceFAZolin  (ANCEF ) IVPB -Antibiotic---Inject 2 g into the vein Every Tuesday,Thursday,and Saturday with dialysis. Indication: MSSA bacteremia Last Day of Therapy: 10/10/24 Labs - Once weekly: CBC/D and BMP, ESR and CRP Method of administration: Per HD protocol - to be given at the patient's HD center, Starting Tue 08/30/2024, Until Mon 10/10/2024-   2)End-stage renal disease on hemodialysis [facility]) Appreciate nephrology for dialysis   plan for 6 weeks of Cefazolin  2g/HD-TTS thru 10/10/24 for MSSA with inability to rule out endocarditis - HD catheter removed as above on 08/29/2024 -Left upper extremity AV fistula is functioning well  continue to use for HD per TTS schedule   3)Hypocalcemia Possibly due to noncompliance with calcitriol after discussion with nephrology Received IV calcium  gluconate Further evaluation with PTH, vitamin D    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 Transfuse for hemoglobin less than 7 Defer decision on Epocrit/Aranesp  to Nephrology service  5)Thrombocytopenia Platelets > 70 K   6)Hypertension-with soft blood pressure readings initially - BP is now running higher,  restarted BP meds  - Compliance With hemodialysis sessions, compliance with low-salt diet and compliance with BP medication strongly advised   Chronic systolic CHF (congestive heart failure) (HCC) Volume overload due to renal failure with ESRD, not compliant with hemodialysis and ultrafiltration Last echocardiogram with reduced LV systolic function -Low-salt diet advised, continue to use hemodialysis sessions to address volume status   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 --Platelets as above #5 -Ascitic fluid analysis as above #1   Hypoglycemia -In the setting of liver cirrhosis with poor glycogen storage as well as ESRD -She required iv D10 solutions to maintain glucose -Weaned off D10 iv solution -Glucose remains stable off IV dextrose  -Encourage adequate oral intake   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 Schedule an appointment as soon as possible for a visit in 1 month(s).   Specialty: Infectious Diseases Why: f/u on MSSA  Bacteremia--- Contact information: [address] [place] [address] [phone]                 Consults obtained -cardiology/ID/nephrology  Diet and Activity recommendation:  As advised  Discharge Instructions    Discharge Instructions     Call MD for:  difficulty breathing, headache or visual disturbances   Complete by: As directed    Call MD for:  persistant dizziness or light-headedness   Complete by: As directed    Call MD for:  persistant nausea and vomiting   Complete by: As directed    Call MD for:  temperature >100.4   Complete by: As directed    Diet - low sodium heart healthy   Complete by: As directed    Discharge instructions   Complete by: As directed    1)ceFAZolin  (ANCEF ) IVPB -Antibiotic---Inject 2 g into  the vein Every Tuesday,Thursday,and Saturday with dialysis. Indication: MSSA bacteremia Last Day of Therapy: 10/10/24 Labs - Once weekly: CBC/D and BMP, ESR and CRP Method of administration: Per HD protocol - to be given at the patient's HD center, Starting Tue 08/30/2024, Until Mon 10/10/2024--  2)follow up with infectious disease specialist Dr. Overton [name] in about 4 to 5 weeks  3)Very Low-salt diet advised---Less than 2 gm of Sodium per day advised----ok to use Mrs DASH salt substitute instead of Salt 4)Weigh yourself daily, call if you gain more than 3 pounds in 1 day or more than 5 pounds in 1 week as your hemodialysis schedule and frequently may need to be adjusted 5)Limit your Fluid  intake to no more than 50 ounces (1.5 Liters) per day 6)Avoid ibuprofen /Advil /Aleve /Motrin Cathy [name]/Naproxen /BC [name]/Meloxicam/Diclofenac/Indomethacin and other Nonsteroidal anti-inflammatory medications as these will make you more likely to bleed and can cause stomach ulcers, can also cause Kidney problems.   Home infusion instructions   Complete by: As directed    To be given at the patient's HD center   Instructions: Flushing of vascular access  device: 0.9% NaCl pre/post medication administration and prn patency; Heparin  100 u/ml, 5ml for implanted ports and Heparin  10u/ml, 5ml for all other central venous catheters.   Increase activity slowly   Complete by: As directed    No wound care   Complete by: As directed          Discharge Medications     Allergies as of 09/01/2024       Reactions   Dialyvite 800 [nephro-vite] Nausea And Vomiting        Medication List     TAKE these medications    acetaminophen  325 MG tablet Commonly known as: TYLENOL  Take 2 tablets (650 mg total) by mouth every 6 (six) hours as needed for mild pain (pain score 1-3) or fever (or Fever >/= 101).   albuterol  108 (90 Base) MCG/ACT inhaler Commonly known as: VENTOLIN  HFA Inhale 2 puffs into the lungs every 6 (six) hours as needed for wheezing or shortness of breath.   amLODipine  10 MG tablet Commonly known as: NORVASC  Take 1 tablet (10 mg total) by mouth in the morning.   aspirin  81 MG chewable tablet Chew 1 tablet (81 mg total) by mouth daily with breakfast. What changed: when to take this   atorvastatin  40 MG tablet Commonly known as: LIPITOR Take 1 tablet (40 mg total) by mouth daily.   busPIRone  7.5 MG tablet Commonly known as: BUSPAR  Take 1 tablet (7.5 mg total) by mouth 2 (two) times daily.   carvedilol  12.5 MG tablet Commonly known as: COREG  Take 1 tablet (12.5 mg total) by mouth 2 (two) times daily with a meal.   ceFAZolin  IVPB Commonly known as: ANCEF  Inject 2 g into the vein Every Tuesday,Thursday,and Saturday with dialysis. Indication:  MSSA bacteremia Last Day of Therapy:  10/10/24 Labs - Once weekly:  CBC/D and BMP, ESR and CRP Method of administration: Per HD protocol - to be given at the patient's HD center   cloNIDine  0.1 MG tablet Commonly known as: CATAPRES  Take 1 tablet (0.1 mg total) by mouth 2 (two) times daily. For BP What changed:  medication strength how much to take additional instructions    gabapentin  300 MG capsule Commonly known as: NEURONTIN  Take 1 capsule (300 mg total) by mouth at bedtime.   hydrALAZINE  100 MG tablet Commonly known as: APRESOLINE  Take 1 tablet (100 mg total) by mouth 3 (three) times  daily.   hydrOXYzine  25 MG tablet Commonly known as: ATARAX  Take 1 tablet (25 mg total) by mouth every 12 (twelve) hours as needed for anxiety or itching.   levETIRAcetam  250 MG tablet Commonly known as: Keppra  Take 1 tablet (250 mg total) by mouth See admin instructions. Take 2 tablets (500 mg total) by mouth daily AND Take extra 250mg  dose after dialysis on Tuesday, Thursday, and Saturday. What changed: See the new instructions.   losartan  100 MG tablet Commonly known as: COZAAR  Take 1 tablet (100 mg total) by mouth daily.   Omron 3 Series BP Monitor Devi Use as directed 2 times a day   oxyCODONE  5 MG immediate release tablet Commonly known as: Oxy IR/ROXICODONE  Take 1 tablet (5 mg total) by mouth every 8 (eight) hours as needed for severe pain (pain score 7-10).   pantoprazole  40 MG tablet Commonly known as: PROTONIX  TAKE 1 TABLET BY MOUTH DAILY BEFORE BREAKFAST               Home Infusion Instuctions  (From admission, onward)           Start     Ordered   08/30/24 0000  Home infusion instructions       Comments: To be given at the patient's HD center  Question:  Instructions  Answer:  Flushing of vascular access device: 0.9% NaCl pre/post medication administration and prn patency; Heparin  100 u/ml, 5ml for implanted ports and Heparin  10u/ml, 5ml for all other central venous catheters.   08/30/24 1457            Major procedures and Radiology Reports - PLEASE review detailed and final reports for all details, in brief -   ECHOCARDIOGRAM COMPLETE Result Date: 08/30/2024    ECHOCARDIOGRAM REPORT   Patient Name:   JESUSA STENERSON Date of Exam: 08/29/2024 Medical Rec #:  [id number]        Height:       67.0 in Accession #:    [id number]        Weight:       174.2 lb Date of Birth:  February 14, 1981       BSA:          1.907 m Patient Age:    42 years         BP:           106/53 mmHg Patient Gender: F                HR:           66 bpm. Exam Location:  [facility] Procedure: 2D Echo, Cardiac Doppler, Color Doppler and Strain Analysis (Both            Spectral and Color Flow Doppler were utilized during procedure). Indications:    Bacteremia R78.81  History:        Patient has prior history of Echocardiogram examinations, most                 recent 02/03/2022. CHF; Risk Factors:Hypertension and Tobacco                 abuse. Hx of End-stage renal disease on hemodialysis (HCC),                 HFrEF (heart failure with reduced ejection fraction) (HCC).  Sonographer:    Aida Pizza RCS Referring Phys: [id number] PRATIK D [facility]  Sonographer Comments: Global longitudinal strain was attempted. IMPRESSIONS  1. Left ventricular  ejection fraction, by estimation, is 60 to 65%. The left ventricle has normal function. The left ventricle has no regional wall motion abnormalities. There is moderate left ventricular hypertrophy. Left ventricular diastolic parameters are indeterminate. The average left ventricular global longitudinal strain is -11.1 %.  2. Right ventricular systolic function is moderately reduced. The right ventricular size is moderately enlarged.  3. Left atrial size was moderately dilated.  4. Right atrial size was moderately dilated.  5. The mitral valve is normal in structure. Mild mitral valve regurgitation. No evidence of mitral stenosis.  6. Tricuspid valve regurgitation is moderate to severe.  7. The aortic valve has an indeterminant number of cusps. Aortic valve regurgitation is mild. Mild aortic valve stenosis. Increased aortic valve velocities likely secondary to increased LVOT velocity. Aortic valve area, by VTI measures 1.68 cm. Aortic valve mean gradient measures 14.0 mmHg. Aortic valve Vmax measures 2.57 m/s.  8. The inferior vena cava is  dilated but IVC collapsibility could not be evaluated.  9. Increased flow velocities may be secondary to anemia, thyrotoxicosis, hyperdynamic or high flow state. FINDINGS  Left Ventricle: Left ventricular ejection fraction, by estimation, is 60 to 65%. The left ventricle has normal function. The left ventricle has no regional wall motion abnormalities. The average left ventricular global longitudinal strain is -11.1 %. Strain was performed and the global longitudinal strain is indeterminate. The left ventricular internal cavity size was normal in size. There is moderate left ventricular hypertrophy. Left ventricular diastolic parameters are indeterminate. Right Ventricle: The right ventricular size is moderately enlarged. No increase in right ventricular wall thickness. Right ventricular systolic function is moderately reduced. Left Atrium: Left atrial size was moderately dilated. Right Atrium: Right atrial size was moderately dilated. Pericardium: There is no evidence of pericardial effusion. Mitral Valve: The mitral valve is normal in structure. Mild mitral valve regurgitation. No evidence of mitral valve stenosis. Tricuspid Valve: The tricuspid valve is normal in structure. Tricuspid valve regurgitation is moderate to severe. No evidence of tricuspid stenosis. Aortic Valve: The aortic valve has an indeterminant number of cusps. Aortic valve regurgitation is mild. Mild aortic stenosis is present. Aortic valve mean gradient measures 14.0 mmHg. Aortic valve peak gradient measures 26.4 mmHg. Aortic valve area, by VTI measures 1.68 cm. Pulmonic Valve: The pulmonic valve was normal in structure. Pulmonic valve regurgitation is mild. No evidence of pulmonic stenosis. Aorta: The aortic root is normal in size and structure. Venous: The inferior vena cava is dilated but IVC collapsibility could not be evaluated. IAS/Shunts: No atrial level shunt detected by color flow Doppler. Additional Comments: 3D was performed not  requiring image post processing on an independent workstation and was indeterminate.  LEFT VENTRICLE PLAX 2D LVIDd:         4.90 cm   Diastology LVIDs:         3.00 cm   LV e' medial:    8.08 cm/s LV PW:         1.40 cm   LV E/e' medial:  17.3 LV IVS:        1.30 cm   LV e' lateral:   12.20 cm/s LVOT diam:     1.80 cm   LV E/e' lateral: 11.5 LV SV:         76 LV SV Index:   40        2D Longitudinal Strain LVOT Area:     2.54 cm  2D Strain GLS Avg:     -11.1 %  RIGHT  VENTRICLE RV S prime:     16.40 cm/s TAPSE (M-mode): 2.7 cm LEFT ATRIUM             Index        RIGHT ATRIUM           Index LA diam:        3.40 cm 1.78 cm/m   RA Area:     23.10 cm LA Vol (A2C):   88.1 ml 46.20 ml/m  RA Volume:   68.00 ml  35.66 ml/m LA Vol (A4C):   77.3 ml 40.54 ml/m LA Biplane Vol: 82.4 ml 43.21 ml/m  AORTIC VALVE AV Area (Vmax):    1.95 cm AV Area (Vmean):   1.80 cm AV Area (VTI):     1.68 cm AV Vmax:           257.00 cm/s AV Vmean:          171.000 cm/s AV VTI:            0.450 m AV Peak Grad:      26.4 mmHg AV Mean Grad:      14.0 mmHg LVOT Vmax:         197.00 cm/s LVOT Vmean:        121.000 cm/s LVOT VTI:          0.297 m LVOT/AV VTI ratio: 0.66  AORTA Ao Root diam: 3.10 cm MITRAL VALVE                TRICUSPID VALVE MV Area (PHT): 2.00 cm     TR Peak grad:   26.8 mmHg MV Decel Time: 380 msec     TR Vmax:        259.00 cm/s MV E velocity: 140.00 cm/s MV A velocity: 94.00 cm/s   SHUNTS MV E/A ratio:  1.49         Systemic VTI:  0.30 m                             Systemic Diam: 1.80 cm Vishnu Priya Mallipeddi Electronically signed by Diannah Late Mallipeddi Signature Date/Time: 08/30/2024/9:30:31 AM    Final    US  ASCITES (ABDOMEN LIMITED) Result Date: 08/29/2024 EXAM: LIMITED ABDOMINAL ULTRASOUND FOR ASCITES EVALUATION TECHNIQUE: Limited real-time sonography of all 4 quadrants of the abdomen was performed for evaluation of ascites. COMPARISON: None. CLINICAL HISTORY: Ascites. FINDINGS: RIGHT LOWER QUADRANT:  Moderate ascites seen. Largest pocket measures 10.7 cm. LEFT LOWER QUADRANT: Moderate ascites seen. OTHER: Limited visualization of the rest of the abdomen demonstrates no other acute abnormality. IMPRESSION: 1. Scattered moderate ascites with the largest pocket in the right lower quadrant measuring up to 10.7 cm. Electronically signed by: Katheleen Faes MD 08/29/2024 03:55 PM EDT RP Workstation: [id number]   CT Head Wo Contrast Result Date: 08/28/2024 CLINICAL DATA:  Mental status changes. EXAM: CT HEAD WITHOUT CONTRAST TECHNIQUE: Contiguous axial images were obtained from the base of the skull through the vertex without intravenous contrast. RADIATION DOSE REDUCTION: This exam was performed according to the departmental dose-optimization program which includes automated exposure control, adjustment of the mA and/or kV according to patient size and/or use of iterative reconstruction technique. COMPARISON:  05/31/2024 FINDINGS: Brain: No acute intracranial abnormality. Specifically, no hemorrhage, hydrocephalus, mass lesion, acute infarction, or significant intracranial injury. Vascular: No hyperdense vessel or unexpected calcification. Skull: No acute calvarial abnormality. Sinuses/Orbits: No acute findings. Mucosal thickening in the ethmoid air cells and maxillary  sinuses. Other: None IMPRESSION: No acute intracranial abnormality. Chronic sinusitis. Electronically Signed   By: Franky Crease M.D.   On: 08/28/2024 21:37   CT CHEST ABDOMEN PELVIS W CONTRAST Result Date: 08/28/2024 CLINICAL DATA:  Sepsis suspected.  Found on floor. EXAM: CT CHEST, ABDOMEN, AND PELVIS WITH CONTRAST TECHNIQUE: Multidetector CT imaging of the chest, abdomen and pelvis was performed following the standard protocol during bolus administration of intravenous contrast. RADIATION DOSE REDUCTION: This exam was performed according to the departmental dose-optimization program which includes automated exposure control, adjustment of the mA  and/or kV according to patient size and/or use of iterative reconstruction technique. CONTRAST:  OMNIPAQUE  IOHEXOL  300 MG/ML  SOLN COMPARISON:  02/01/2024 FINDINGS: CT CHEST FINDINGS Cardiovascular: Cardiomegaly. Scattered coronary artery calcifications. Dialysis catheter tip in the right atrium. Aorta normal caliber. Mediastinum/Nodes: No mediastinal, hilar, or axillary adenopathy. Trachea and esophagus are unremarkable. Thyroid  unremarkable. Lungs/Pleura: Small right pleural effusion loculated superiorly and posteriorly and within the major fissure. No confluent airspace opacities or overt edema. Musculoskeletal: Chest wall soft tissues are unremarkable. No acute bony abnormality. CT ABDOMEN PELVIS FINDINGS Hepatobiliary: No focal hepatic abnormality. Gallbladder unremarkable. Pancreas: No focal abnormality or ductal dilatation. Spleen: No focal abnormality.  Normal size. Adrenals/Urinary Tract: Kidneys are atrophic. No renal or adrenal mass. No hydronephrosis. Urinary bladder not visualized. Stomach/Bowel: Normal appendix. Stomach, large and small bowel grossly unremarkable. Vascular/Lymphatic: No evidence of aneurysm or adenopathy. Diffuse aortic atherosclerosis. Reproductive: Uterus and adnexa unremarkable.  No mass. Other: Large volume ascites, slightly increased since prior study. Musculoskeletal: No acute bony abnormality. IMPRESSION: Cardiomegaly, coronary artery disease.  Aortic atherosclerosis. Small loculated right pleural effusion posteriorly and in the right major fissure. Large volume ascites, increasing since prior study. Electronically Signed   By: Franky Crease M.D.   On: 08/28/2024 21:34   US  Paracentesis Result Date: 08/26/2024 INDICATION: Patient history of end-stage renal disease on hemodialysis, admitted with fluid overload and recurrent ascites. Request for diagnostic and therapeutic paracentesis. EXAM: ULTRASOUND GUIDED DIAGNOSTIC AND THERAPEUTIC PARACENTESIS MEDICATIONS: 8 mL 1%  lidocaine  COMPLICATIONS: None immediate. PROCEDURE: Informed written consent was obtained from the patient after a discussion of the risks, benefits and alternatives to treatment. A timeout was performed prior to the initiation of the procedure. Initial ultrasound scanning demonstrates a large amount of ascites within the left lower abdominal quadrant. The left lower abdomen was prepped and draped in the usual sterile fashion. 1% lidocaine  was used for local anesthesia. Following this, a 19 gauge, 7-cm, Yueh catheter was introduced. An ultrasound image was saved for documentation purposes. The paracentesis was performed. The catheter was removed and a dressing was applied. The patient tolerated the procedure well without immediate post procedural complication. Patient received post-procedure intravenous albumin  on the floor postprocedure; see nursing notes for details. FINDINGS: A total of approximately 5.6 L of clear, dark yellow fluid was removed. Samples were sent to the laboratory as requested by the clinical team. IMPRESSION: Successful ultrasound-guided paracentesis yielding 5.6 liters of peritoneal fluid. Performed by Clotilda Hesselbach, PA-C Electronically Signed   By: Ester Sides M.D.   On: 08/26/2024 14:23   DG Chest Port 1 View Result Date: 08/24/2024 CLINICAL DATA:  Edema for 3 days. Lupus flare up. Headache and nausea. Chest and rib pain. EXAM: PORTABLE CHEST 1 VIEW COMPARISON:  05/31/2024 FINDINGS: Left central venous catheter with tip over the cavoatrial junction region. No pneumothorax. Cardiac enlargement. Shallow inspiration with atelectasis or fibrosis in the lung bases. No airspace disease or consolidation in  the lungs. No pneumothorax. Mediastinal contours appear intact. IMPRESSION: Shallow inspiration with atelectasis in the lung bases. Cardiac enlargement. No focal consolidation. Electronically Signed   By: Elsie Gravely M.D.   On: 08/24/2024 22:54    Micro Results   Recent Results  (from the past 240 hours)  Gram stain     Status: None   Collection Time: 08/26/24 11:43 AM   Specimen: Peritoneal Washings; Peritoneal Fluid  Result Value Ref Range Status   Specimen Description PERITONEAL  Final   Special Requests NONE  Final   Gram Stain   Final    CYTOSPIN SMEAR ASCITIC WBC PRESENT, PREDOMINANTLY MONONUCLEAR NO ORGANISMS SEEN Performed at [facility], [address], [place], [place] [address]    Report Status 08/26/2024 FINAL  Final  Culture, blood (routine x 2)     Status: Abnormal   Collection Time: 08/28/24  8:43 PM   Specimen: BLOOD  Result Value Ref Range Status   Specimen Description   Final    BLOOD RIGHT ANTECUBITAL Performed at [facility], [address] [place], [place] [address]    Special Requests   Final    BOTTLES DRAWN AEROBIC AND ANAEROBIC Blood Culture adequate volume Performed at [facility], [address] [place], [place] [address]    Culture  Setup Time   Final    GRAM POSITIVE COCCI IN BOTH AEROBIC AND ANAEROBIC BOTTLES Gram Stain Report Called to,Read Back By and Verified With: WOODEN C AT 0627 ON [id number] BY PURDIE J CRITICAL RESULT CALLED TO, READ BACK BY AND VERIFIED WITH: PHARMD LINDA LONG [id number] FCP Performed at [facility], [address] [place], [place] [address]    Culture STAPHYLOCOCCUS AUREUS (A)  Final   Report Status 08/31/2024 FINAL  Final   Organism ID, Bacteria STAPHYLOCOCCUS AUREUS  Final      Susceptibility   Staphylococcus aureus - MIC*    CIPROFLOXACIN <=0.5 SENSITIVE Sensitive     ERYTHROMYCIN  <=0.25 SENSITIVE Sensitive     GENTAMICIN 4 SENSITIVE Sensitive     OXACILLIN <=0.25 SENSITIVE Sensitive     TETRACYCLINE <=1 SENSITIVE Sensitive     VANCOMYCIN  1 SENSITIVE Sensitive     TRIMETH /SULFA  <=10 SENSITIVE Sensitive     CLINDAMYCIN <=0.25 SENSITIVE Sensitive     RIFAMPIN <=0.5 SENSITIVE Sensitive     Inducible Clindamycin NEGATIVE Sensitive     LINEZOLID 2 SENSITIVE Sensitive      * STAPHYLOCOCCUS AUREUS  Resp panel by RT-PCR (RSV, Flu A&B, Covid) Anterior Nasal Swab     Status: None   Collection Time: 08/28/24  8:43 PM   Specimen: Anterior Nasal Swab  Result Value Ref Range Status   SARS Coronavirus 2 by RT PCR NEGATIVE NEGATIVE Final    Comment: (NOTE) SARS-CoV-2 target nucleic acids are NOT DETECTED.  The SARS-CoV-2 RNA is generally detectable in upper respiratory specimens during the acute phase of infection. The lowest concentration of SARS-CoV-2 viral copies this assay can detect is 138 copies/mL. A negative result does not preclude SARS-Cov-2 infection and should not be used as the sole basis for treatment or other patient management decisions. A negative result may occur with  improper specimen collection/handling, submission of specimen other than nasopharyngeal swab, presence of viral mutation(s) within the areas targeted by this assay, and inadequate number of viral copies(<138 copies/mL). A negative result must be combined with clinical observations, patient history, and epidemiological information. The expected result is Negative.  Fact Sheet for Patients:  BloggerCourse.com  Fact Sheet for Healthcare Providers:  SeriousBroker.it  This test is no t yet approved or cleared by the [place]  FDA and  has been authorized for detection and/or diagnosis of SARS-CoV-2 by FDA under an Emergency Use Authorization (EUA). This EUA will remain  in effect (meaning this test can be used) for the duration of the COVID-19 declaration under Section 564(b)(1) of the Act, 21 U.S.C.section 360bbb-3(b)(1), unless the authorization is terminated  or revoked sooner.       Influenza A by PCR NEGATIVE NEGATIVE Final   Influenza B by PCR NEGATIVE NEGATIVE Final    Comment: (NOTE) The Xpert Xpress SARS-CoV-2/FLU/RSV plus assay is intended as an aid in the diagnosis of influenza from Nasopharyngeal swab specimens  and should not be used as a sole basis for treatment. Nasal washings and aspirates are unacceptable for Xpert Xpress SARS-CoV-2/FLU/RSV testing.  Fact Sheet for Patients: BloggerCourse.com  Fact Sheet for Healthcare Providers: SeriousBroker.it  This test is not yet approved or cleared by the [place]  FDA and has been authorized for detection and/or diagnosis of SARS-CoV-2 by FDA under an Emergency Use Authorization (EUA). This EUA will remain in effect (meaning this test can be used) for the duration of the COVID-19 declaration under Section 564(b)(1) of the Act, 21 U.S.C. section 360bbb-3(b)(1), unless the authorization is terminated or revoked.     Resp Syncytial Virus by PCR NEGATIVE NEGATIVE Final    Comment: (NOTE) Fact Sheet for Patients: BloggerCourse.com  Fact Sheet for Healthcare Providers: SeriousBroker.it  This test is not yet approved or cleared by the [place]  FDA and has been authorized for detection and/or diagnosis of SARS-CoV-2 by FDA under an Emergency Use Authorization (EUA). This EUA will remain in effect (meaning this test can be used) for the duration of the COVID-19 declaration under Section 564(b)(1) of the Act, 21 U.S.C. section 360bbb-3(b)(1), unless the authorization is terminated or revoked.  Performed at [facility], [address], [place], [place] [address]   Blood Culture ID Panel (Reflexed)     Status: Abnormal   Collection Time: 08/28/24  8:43 PM  Result Value Ref Range Status   Enterococcus faecalis NOT DETECTED NOT DETECTED Final   Enterococcus Faecium NOT DETECTED NOT DETECTED Final   Listeria monocytogenes NOT DETECTED NOT DETECTED Final   Staphylococcus species DETECTED (A) NOT DETECTED Final    Comment: CRITICAL RESULT CALLED TO, READ BACK BY AND VERIFIED WITH: PHARMD LINDA LONG [id number] FCP    Staphylococcus aureus  (BCID) DETECTED (A) NOT DETECTED Final    Comment: CRITICAL RESULT CALLED TO, READ BACK BY AND VERIFIED WITH: PHARMD LINDA LONG [id number] FCP    Staphylococcus epidermidis NOT DETECTED NOT DETECTED Final   Staphylococcus lugdunensis NOT DETECTED NOT DETECTED Final   Streptococcus species NOT DETECTED NOT DETECTED Final   Streptococcus agalactiae NOT DETECTED NOT DETECTED Final   Streptococcus pneumoniae NOT DETECTED NOT DETECTED Final   Streptococcus pyogenes NOT DETECTED NOT DETECTED Final   A.calcoaceticus-baumannii NOT DETECTED NOT DETECTED Final   Bacteroides fragilis NOT DETECTED NOT DETECTED Final   Enterobacterales NOT DETECTED NOT DETECTED Final   Enterobacter cloacae complex NOT DETECTED NOT DETECTED Final   Escherichia coli NOT DETECTED NOT DETECTED Final   Klebsiella aerogenes NOT DETECTED NOT DETECTED Final   Klebsiella oxytoca NOT DETECTED NOT DETECTED Final   Klebsiella pneumoniae NOT DETECTED NOT DETECTED Final   Proteus species NOT DETECTED NOT DETECTED Final   Salmonella species NOT DETECTED NOT DETECTED Final  Serratia marcescens NOT DETECTED NOT DETECTED Final   Haemophilus influenzae NOT DETECTED NOT DETECTED Final   Neisseria meningitidis NOT DETECTED NOT DETECTED Final   Pseudomonas aeruginosa NOT DETECTED NOT DETECTED Final   Stenotrophomonas maltophilia NOT DETECTED NOT DETECTED Final   Candida albicans NOT DETECTED NOT DETECTED Final   Candida auris NOT DETECTED NOT DETECTED Final   Candida glabrata NOT DETECTED NOT DETECTED Final   Candida krusei NOT DETECTED NOT DETECTED Final   Candida parapsilosis NOT DETECTED NOT DETECTED Final   Candida tropicalis NOT DETECTED NOT DETECTED Final   Cryptococcus neoformans/gattii NOT DETECTED NOT DETECTED Final   Meth resistant mecA/C and MREJ NOT DETECTED NOT DETECTED Final    Comment: Performed at [facility], [address], [place], [place] [address]  Culture, blood (routine x 2)     Status: Abnormal    Collection Time: 08/28/24  9:50 PM   Specimen: BLOOD RIGHT HAND  Result Value Ref Range Status   Specimen Description   Final    BLOOD RIGHT HAND Performed at [facility], [address], [place], [place] [address]    Special Requests   Final    BOTTLES DRAWN AEROBIC AND ANAEROBIC Blood Culture adequate volume Performed at [facility], [address], [place], [place] [address]    Culture  Setup Time   Final    GRAM POSITIVE COCCI IN BOTH AEROBIC AND ANAEROBIC BOTTLES Gram Stain Report Called to,Read Back By and Verified With: G ROOS AT 0727 ON [id number] BY S DALTON GRAM STAIN REVIEWED-AGREE WITH RESULT DRT    Culture (A)  Final    STAPHYLOCOCCUS AUREUS SUSCEPTIBILITIES PERFORMED ON PREVIOUS CULTURE WITHIN THE LAST 5 DAYS. Performed at [facility], [address] [place], [place] [address]    Report Status 08/31/2024 FINAL  Final  Body fluid culture w Gram Stain     Status: None   Collection Time: 08/28/24 10:39 PM   Specimen: Peritoneal Washings; Body Fluid  Result Value Ref Range Status   Specimen Description   Final    PERITONEAL Performed at [facility], [address], [place], [place] [address]    Special Requests   Final    NONE Performed at [facility], [address], [place], [place] [address]    Gram Stain   Final    NO ORGANISMS SEEN WBC PRESENT, PREDOMINANTLY PMN CYTOSPIN SMEAR Performed at [facility], [address], [place], [place] [address]    Culture   Final    NO GROWTH 3 DAYS Performed at [facility], [address], [place], [place] [address]    Report Status 09/01/2024 FINAL  Final  MRSA Next Gen by PCR, Nasal     Status: None   Collection Time: 08/29/24  1:04 AM   Specimen: Nasal Mucosa; Nasal Swab  Result Value Ref Range Status   MRSA by PCR Next Gen NOT DETECTED NOT DETECTED Final    Comment: (NOTE) The GeneXpert MRSA Assay (FDA approved for NASAL specimens only), is one component of a comprehensive MRSA colonization  surveillance program. It is not intended to diagnose MRSA infection nor to guide or monitor treatment for MRSA infections. Test performance is not FDA approved in patients less than 68 years old. Performed at [facility], [address], [place], [place] [address]   Cath Tip Culture     Status: None   Collection Time: 08/29/24 12:45 PM   Specimen: Urine, Catheterized; Other  Result Value Ref  Range Status   Specimen Description   Final    URINE, CATHETERIZED Performed at [facility], [address] [place] [address], [place], [place] [address]    Special Requests   Final    NONE Performed at [facility], [address], [place], [place] [address]    Culture 100000 CFU STAPHYLOCOCCUS AUREUS  Final   Report Status 08/31/2024 FINAL  Final   Organism ID, Bacteria STAPHYLOCOCCUS AUREUS  Final      Susceptibility   Staphylococcus aureus - MIC*    CIPROFLOXACIN <=0.5 SENSITIVE Sensitive     GENTAMICIN 4 SENSITIVE Sensitive     NITROFURANTOIN  <=16 SENSITIVE Sensitive     OXACILLIN <=0.25 SENSITIVE Sensitive     TETRACYCLINE <=1 SENSITIVE Sensitive     VANCOMYCIN  1 SENSITIVE Sensitive     TRIMETH /SULFA  <=10 SENSITIVE Sensitive     CLINDAMYCIN <=0.25 SENSITIVE Sensitive     RIFAMPIN <=0.5 SENSITIVE Sensitive     Inducible Clindamycin NEGATIVE Sensitive     LINEZOLID 2 SENSITIVE Sensitive     * 100000 CFU STAPHYLOCOCCUS AUREUS  Culture, blood (Routine X 2) w Reflex to ID Panel     Status: None (Preliminary result)   Collection Time: 08/29/24  3:39 PM   Specimen: BLOOD  Result Value Ref Range Status   Specimen Description BLOOD RIGHT ANTECUBITAL  Final   Special Requests   Final    BOTTLES DRAWN AEROBIC ONLY Blood Culture adequate volume   Culture   Final    NO GROWTH 3 DAYS Performed at [facility], [address], [place], [place] [address]    Report Status PENDING  Incomplete  Culture, blood (Routine X 2) w Reflex to ID Panel     Status: None (Preliminary result)   Collection Time: 08/29/24  3:45  PM   Specimen: BLOOD  Result Value Ref Range Status   Specimen Description BLOOD RIGHT ANTECUBITAL  Final   Special Requests   Final    BOTTLES DRAWN AEROBIC ONLY Blood Culture results may not be optimal due to an inadequate volume of blood received in culture bottles   Culture   Final    NO GROWTH 3 DAYS Performed at [facility], [address], [place], [place] [address]    Report Status PENDING  Incomplete    Today   Subjective    Arbor Leer today has no new complaints No fever  Or chills   No Nausea, Vomiting or Diarrhea  -- Eating and drinking well -No further hypoglycemia           Patient has been seen and examined prior to discharge   Objective   Blood pressure (!) 148/73, pulse 67, temperature 100.1 F (37.8 C), temperature source Oral, resp. rate (!) 22, height 5' 7 (1.702 m), weight 79 kg, last menstrual period 04/07/2020, SpO2 94%.   Intake/Output Summary (Last 24 hours) at 09/01/2024 1211 Last data filed at 09/01/2024 0800 Gross per 24 hour  Intake 200 ml  Output 2500 ml  Net -2300 ml    Exam Gen:- Awake Alert, no acute distress , coherent and interactive, appropriate HEENT:- [place].AT, No sclera icterus Neck-Supple Neck,No JVD,.  Lungs-  CTAB , good air movement bilaterally CV- S1, S2 normal, regular Abd-  +ve B.Sounds, Abd Soft, No tenderness,    Extremity/Skin:- No  edema,   good pulses Psych-affect is appropriate, oriented x3 Neuro-no new focal deficits, no tremors  MSK-left upper extremity AV fistula with positive bruit and thrill    Data Review  CBC w Diff:  Lab Results  Component Value Date   WBC 10.5 09/01/2024   HGB 7.8 (L) 09/01/2024   HGB 11.0 (L) 08/27/2023   HCT 23.8 (L) 09/01/2024   HCT 35.1 08/27/2023   PLT 71 (L) 09/01/2024   PLT 217 08/27/2023   LYMPHOPCT 2 08/28/2024   MONOPCT 5 08/28/2024   EOSPCT 0 08/28/2024   BASOPCT 0 08/28/2024   CMP:  Lab Results  Component Value Date   NA 136 09/01/2024   NA 136  12/10/2018   K 4.0 09/01/2024   CL 94 (L) 09/01/2024   CO2 26 09/01/2024   BUN 37 (H) 09/01/2024   BUN 9 12/10/2018   CREATININE 10.00 (H) 09/01/2024   PROT 5.4 (L) 08/31/2024   PROT 6.1 12/10/2018   ALBUMIN  3.2 (L) 09/01/2024   ALBUMIN  3.1 (L) 12/10/2018   BILITOT 0.7 08/31/2024   BILITOT <0.2 12/10/2018   ALKPHOS 239 (H) 08/31/2024   AST 23 08/31/2024   ALT 10 08/31/2024   Total Discharge time is about 33 minutes  Rendall Carwin M.D on 09/01/2024 at 12:11 PM  Go to [email or web address] -  for contact info  [facility] - Office  [phone]

## 2024-09-01 NOTE — Progress Notes (Addendum)
  D/ orders noted. Contacted out-pt HD clinic, [facility], to inform of pt d/c and anticipated arrival back on Saturday to resume TTS schedule. Reminded of cefazolin  at this time. Will fax over d/c summ and last neph note once available.    Jung Ingerson Dialysis navigator [phone]

## 2024-09-01 NOTE — Progress Notes (Signed)
 Discharge education reviewed with patient. IV removed from right arm. No issues with discharge process.

## 2024-09-01 NOTE — Telephone Encounter (Signed)
 PCS forms  noted Copied Sleeved Original placed front desk completed folder-appt scheduled 10.14.2025 Copy in front copy folder

## 2024-09-01 NOTE — Care Management Important Message (Signed)
 Important Message  Patient Details  Name: Cathy [name] MRN: [id number] Date of Birth: [birth year]   Important Message Given:  N/A - LOS <3 / Initial given by admissions     Duwaine LITTIE Ada 09/01/2024, 10:58 AM

## 2024-09-01 NOTE — Telephone Encounter (Unsigned)
 Copied from CRM [id number]. Topic: Clinical - Medication Question >> Sep 01, 2024  2:49 PM Shanda MATSU wrote: Reason for CRM: Marolyn with [facility] called in req that all patient's active medications be xferred to [facility], Alex's direct phone # is [phone]. Information can be sent via fax escript. [facility] - [place], [place] - [address] [place] [address] Phone: [phone] Fax: [phone] Hours: Not open 24 hours

## 2024-09-01 NOTE — Progress Notes (Signed)
 [facility] Progress Note    Outpatient HD orders (per recent admission):  [facility] on TTS (schedule recently changed) . EDW 69 kg HD Bath 1K/2.5Ca, Na 138 Time 4:00 Heparin  500 bolus then 500 units/hr. Access LUE AVF BFR 400 DFR 600, 15g 1in, Nipro 17H, Temp 37  Assessment/ Plan:   ESRD   -HD today and then maintain MWF, can transition back to TTS on discharge or later during admit. Certainly still with volume onboard.  Catheter ON Monday.  Tolerated HD on Wed with 2.5L net UF; she looks great from renal standpoint. Next HD can be Sat per outpt TTS regimen.   Sepsis secondary to SBP/gram positive bacteremia (Staph aureus) -abx/mgmt per primary service & ID--currently receiving ancef  -TDC removed Monday -TEE pending?  - plan for 6 weeks of Cefazolin  2g/HD-TTS thru 10/10/24    Hyperkalemia -improved. Managing with HD   Volume/ hypertension  -EDW 69kg. UF as tolerated; still w/  significant edema in the lower extremities -anti-HTNs currently on hold given high risk for hypotension   Chronic Systolic CHF -UF as tolerated with HD   Cirrhosis -s/p diagnostic paracentesis -per primary service   Anemia of Chronic Kidney Disease Hemoglobin 7.3  -Avoid IV iron  in the context of bacteremia -Transfuse PRN for Hgb <7 -checking Fe panel. Starting aranesp  100mcg qwed's   Secondary Hyperparathyroidism/Hyperphosphatemia - resume home meds, monitor phos (will recheck on Friday) -cca ~7.7, started on calcitriol 0.5 MWF with HD  Vascular access -TDC removed by gen surg on 9/29 given bacteremia. AVF is functional with a good bruit    Subjective:   Patient seen and examined bedside. Net uf 2.5L with HD Monday and also on Wed. Has been hypoglycemic therefore running on d10. She is alert today and cooperative. Denies any chest pain, SOB, abdominal pain, nausea and has an appetite. Asking when she can go home.   Objective:   BP (!) 177/100   Pulse 67   Temp  100.1 F (37.8 C) (Oral)   Resp 18   Ht 5' 7 (1.702 m)   Wt 79 kg   LMP 04/07/2020 (Within Months)   SpO2 96%   BMI 27.28 kg/m   Intake/Output Summary (Last 24 hours) at 09/01/2024 0854 Last data filed at 09/01/2024 0631 Gross per 24 hour  Intake 100 ml  Output 2500 ml  Net -2400 ml   Weight change:   Physical Exam: Gen: ill appearing CVS: RRR Resp: CTA BL Abd: distended, soft, NT Ext: no sig edema b/l Les Neuro: awake but drowsy, following some commands Dialysis access: LUE AVF +b/t  Imaging: No results found.   Labs: BMET Recent Labs  Lab 08/28/24 2043 08/29/24 0349 08/29/24 2323 08/30/24 0514 08/31/24 0507 09/01/24 0511  NA 140 138 134* 133* 135 136  K 6.1* 5.7* 4.1 4.2 5.0 4.0  CL 100 102 94* 96* 94* 94*  CO2 17* 17* 24 26 22 26   GLUCOSE 64* 149* 76 84 87 84  BUN 91* 92* 52* 58* 66* 37*  CREATININE 20.60* 20.41* 12.96* 12.95* 15.00* 10.00*  CALCIUM  6.4* 5.7* 6.1* 6.3* 7.1* 7.5*  PHOS  --   --   --  6.0*  --  3.5   CBC Recent Labs  Lab 08/28/24 2043 08/29/24 1539 08/30/24 0514 08/31/24 0507 09/01/24 0511  WBC 10.7* 15.0* 11.3* 10.9* 10.5  NEUTROABS 9.8*  --   --   --   --   HGB 9.0* 8.1* 7.3* 7.9* 7.8*  HCT 28.2* 25.2*  22.3* 24.3* 23.8*  MCV 91.9 91.3 90.3 89.7 89.1  PLT 71* 70* 50* 51* 71*    Medications:     aspirin   81 mg Oral QHS   busPIRone   7.5 mg Oral BID   calcitRIOL  0.5 mcg Oral Q M,W,F-HD   Chlorhexidine  Gluconate Cloth  6 each Topical Q0600   darbepoetin (ARANESP ) injection - DIALYSIS  100 mcg Subcutaneous Q Wed-1800   gabapentin   300 mg Oral QHS   levETIRAcetam   500 mg Oral Daily   And   levETIRAcetam   250 mg Oral Q T,Th,Sa-HD   levETIRAcetam   250 mg Oral Q M,W,F-HD   pantoprazole   40 mg Oral QAC breakfast

## 2024-09-01 NOTE — TOC Transition Note (Signed)
 Transition of Care [facility]) - Discharge Note   Patient Details  Name: Cathy [name] MRN: [id number] Date of Birth: 1980/12/10  Transition of Care [facility]) CM/SW Contact:  Noreen KATHEE Cleotilde ISRAEL Phone Number: 09/01/2024, 10:47 AM   Clinical Narrative:     CSW spoke with patient at bedside. Patient reports that she lives at home with her children and that her mother-n-law will start coming over to help her. Patient does not use or have any equipment at home. Patient goes to [facility] in [place]. Patient aware of her chair time and days- TTS. Patient will DC home today and continue with her antibiotics with HD. CSW signing off.   Final next level of care: Home/Self Care Barriers to Discharge: Barriers Resolved   Patient Goals and CMS Choice Patient states their goals for this hospitalization and ongoing recovery are:: return home          Discharge Placement              Patient chooses bed at:  (DC back home) Patient to be transferred to facility by: POV Name of family member notified: Fredda Patient and family notified of of transfer: 09/01/24  Discharge Plan and Services Additional resources added to the After Visit Summary for                                       Social Drivers of Health (SDOH) Interventions SDOH Screenings   Food Insecurity: No Food Insecurity (08/25/2024)  Housing: Low Risk  (08/25/2024)  Transportation Needs: No Transportation Needs (08/25/2024)  Utilities: Not At Risk (08/25/2024)  Alcohol Screen: Low Risk  (02/18/2024)  Depression (PHQ2-9): High Risk (06/16/2024)  Financial Resource Strain: Low Risk  (02/18/2024)  Physical Activity: Inactive (02/18/2024)  Social Connections: Socially Isolated (02/18/2024)  Stress: Stress Concern Present (02/18/2024)  Tobacco Use: High Risk (08/29/2024)     Readmission Risk Interventions    09/01/2024   10:46 AM 09/01/2024   10:14 AM 08/29/2024    4:06 PM  Readmission Risk Prevention Plan   Transportation Screening Complete Complete Complete  Medication Review Oceanographer) Complete Complete Complete  HRI or Home Care Consult Complete Complete Not Complete  HRI or Home Care Consult Pt Refusal Comments Home alone with children - Mother -n -law will come in to help patient out    SW Recovery Care/Counseling Consult Complete  Not Complete  Palliative Care Screening Not Applicable  Not Applicable  Skilled Nursing Facility Not Applicable  Not Applicable

## 2024-09-02 ENCOUNTER — Telehealth: Payer: Self-pay

## 2024-09-02 NOTE — Transitions of Care (Post Inpatient/ED Visit) (Signed)
   09/02/2024  Name: Cathy [name] MRN: [id number] DOB: 04-Jan-1981  Today's TOC FU Call Status: Today's TOC FU Call Status:: Unsuccessful Call (1st Attempt) Unsuccessful Call (1st Attempt) Date: 09/02/24  Attempted to reach the patient regarding the most recent Inpatient/ED visit. Unable to leave a voicemail message at phone number listed in DPR.  Follow Up Plan: Additional outreach attempts will be made to reach the patient to complete the Transitions of Care (Post Inpatient/ED visit) call.   Richerd Fish, RN, BSN, CCM [facility], [facility] RN Care Manager Direct Dial : [phone]

## 2024-09-03 DIAGNOSIS — Z992 Dependence on renal dialysis: Secondary | ICD-10-CM | POA: Diagnosis not present

## 2024-09-03 DIAGNOSIS — N186 End stage renal disease: Secondary | ICD-10-CM | POA: Diagnosis not present

## 2024-09-03 DIAGNOSIS — B9561 Methicillin susceptible Staphylococcus aureus infection as the cause of diseases classified elsewhere: Secondary | ICD-10-CM | POA: Diagnosis not present

## 2024-09-03 DIAGNOSIS — T80212A Local infection due to central venous catheter, initial encounter: Secondary | ICD-10-CM | POA: Diagnosis not present

## 2024-09-03 LAB — CULTURE, BLOOD (ROUTINE X 2)
Culture: NO GROWTH
Culture: NO GROWTH
Special Requests: ADEQUATE

## 2024-09-05 ENCOUNTER — Telehealth: Payer: Self-pay

## 2024-09-05 NOTE — Transitions of Care (Post Inpatient/ED Visit) (Signed)
   09/05/2024  Name: Cathy [name] MRN: [id number] DOB: [birth year]  Today's TOC FU Call Status: Today's TOC FU Call Status:: Unsuccessful Call (2nd Attempt) Unsuccessful Call (2nd Attempt) Date: 09/05/24  Attempted to reach the patient regarding the most recent Inpatient/ED visit. Unable to leave a voicemail message.  Follow Up Plan: Additional outreach attempts will be made to reach the patient to complete the Transitions of Care (Post Inpatient/ED visit) call.   Richerd Fish, RN, BSN, CCM [facility], [facility] RN Care Manager Direct Dial : [phone]

## 2024-09-06 ENCOUNTER — Telehealth: Payer: Self-pay

## 2024-09-06 DIAGNOSIS — B9561 Methicillin susceptible Staphylococcus aureus infection as the cause of diseases classified elsewhere: Secondary | ICD-10-CM | POA: Diagnosis not present

## 2024-09-06 DIAGNOSIS — Z992 Dependence on renal dialysis: Secondary | ICD-10-CM | POA: Diagnosis not present

## 2024-09-06 DIAGNOSIS — N186 End stage renal disease: Secondary | ICD-10-CM | POA: Diagnosis not present

## 2024-09-06 DIAGNOSIS — T80212A Local infection due to central venous catheter, initial encounter: Secondary | ICD-10-CM | POA: Diagnosis not present

## 2024-09-06 NOTE — Transitions of Care (Post Inpatient/ED Visit) (Signed)
   09/06/2024  Name: Cathy [name] MRN: [id number] DOB: 29-Apr-1981  Today's TOC FU Call Status: Today's TOC FU Call Status:: Unsuccessful Call (3rd Attempt) Unsuccessful Call (3rd Attempt) Date: 09/06/24  Attempted to reach the patient regarding the most recent Inpatient/ED visit.  Follow Up Plan: No further outreach attempts will be made at this time. We have been unable to contact the patient. Unable to leave a voicemail message.   Richerd Fish, RN, BSN, CCM [facility], [facility] RN Care Manager Direct Dial : [phone]

## 2024-09-06 NOTE — Progress Notes (Signed)
 Late note entry 0914 09/06/24 D/c summary and progress sent at this time  [facility] [phone]

## 2024-09-10 DIAGNOSIS — Z992 Dependence on renal dialysis: Secondary | ICD-10-CM | POA: Diagnosis not present

## 2024-09-10 DIAGNOSIS — N186 End stage renal disease: Secondary | ICD-10-CM | POA: Diagnosis not present

## 2024-09-10 DIAGNOSIS — T80212A Local infection due to central venous catheter, initial encounter: Secondary | ICD-10-CM | POA: Diagnosis not present

## 2024-09-10 DIAGNOSIS — B9561 Methicillin susceptible Staphylococcus aureus infection as the cause of diseases classified elsewhere: Secondary | ICD-10-CM | POA: Diagnosis not present

## 2024-09-11 ENCOUNTER — Other Ambulatory Visit: Payer: Self-pay | Admitting: Family Medicine

## 2024-09-13 ENCOUNTER — Inpatient Hospital Stay

## 2024-09-13 DIAGNOSIS — B9561 Methicillin susceptible Staphylococcus aureus infection as the cause of diseases classified elsewhere: Secondary | ICD-10-CM | POA: Diagnosis not present

## 2024-09-13 DIAGNOSIS — Z992 Dependence on renal dialysis: Secondary | ICD-10-CM | POA: Diagnosis not present

## 2024-09-13 DIAGNOSIS — T80212A Local infection due to central venous catheter, initial encounter: Secondary | ICD-10-CM | POA: Diagnosis not present

## 2024-09-13 DIAGNOSIS — N186 End stage renal disease: Secondary | ICD-10-CM | POA: Diagnosis not present

## 2024-09-14 ENCOUNTER — Encounter (INDEPENDENT_AMBULATORY_CARE_PROVIDER_SITE_OTHER): Payer: Self-pay | Admitting: Gastroenterology

## 2024-09-20 ENCOUNTER — Ambulatory Visit: Admitting: Neurology

## 2024-09-20 DIAGNOSIS — N186 End stage renal disease: Secondary | ICD-10-CM | POA: Diagnosis not present

## 2024-09-20 DIAGNOSIS — B9561 Methicillin susceptible Staphylococcus aureus infection as the cause of diseases classified elsewhere: Secondary | ICD-10-CM | POA: Diagnosis not present

## 2024-09-20 DIAGNOSIS — Z992 Dependence on renal dialysis: Secondary | ICD-10-CM | POA: Diagnosis not present

## 2024-09-20 DIAGNOSIS — T80212A Local infection due to central venous catheter, initial encounter: Secondary | ICD-10-CM | POA: Diagnosis not present

## 2024-09-24 DIAGNOSIS — Z992 Dependence on renal dialysis: Secondary | ICD-10-CM | POA: Diagnosis not present

## 2024-09-24 DIAGNOSIS — N186 End stage renal disease: Secondary | ICD-10-CM | POA: Diagnosis not present

## 2024-09-24 DIAGNOSIS — T80212A Local infection due to central venous catheter, initial encounter: Secondary | ICD-10-CM | POA: Diagnosis not present

## 2024-09-24 DIAGNOSIS — B9561 Methicillin susceptible Staphylococcus aureus infection as the cause of diseases classified elsewhere: Secondary | ICD-10-CM | POA: Diagnosis not present

## 2024-09-26 ENCOUNTER — Ambulatory Visit (INDEPENDENT_AMBULATORY_CARE_PROVIDER_SITE_OTHER)

## 2024-09-26 VITALS — BP 127/85 | HR 74 | Ht 67.0 in | Wt 144.1 lb

## 2024-09-26 DIAGNOSIS — R197 Diarrhea, unspecified: Secondary | ICD-10-CM

## 2024-09-26 MED ORDER — PANTOPRAZOLE SODIUM 40 MG PO TBEC: 40.0000 mg | DELAYED_RELEASE_TABLET | Freq: Every day | ORAL | 3 refills | Status: DC

## 2024-09-26 MED ORDER — BUSPIRONE HCL 7.5 MG PO TABS: 7.5000 mg | ORAL_TABLET | Freq: Two times a day (BID) | ORAL | 3 refills | Status: DC

## 2024-09-26 MED ORDER — HYDRALAZINE HCL 100 MG PO TABS: 100.0000 mg | ORAL_TABLET | Freq: Three times a day (TID) | ORAL | 5 refills | Status: DC

## 2024-09-26 MED ORDER — CARVEDILOL 12.5 MG PO TABS: 12.5000 mg | ORAL_TABLET | Freq: Two times a day (BID) | ORAL | 3 refills | Status: DC

## 2024-09-26 MED ORDER — LOPERAMIDE HCL 2 MG PO TABS: 2.0000 mg | ORAL_TABLET | Freq: Three times a day (TID) | ORAL | 0 refills | Status: DC | PRN

## 2024-09-26 MED ORDER — LOSARTAN POTASSIUM 100 MG PO TABS: 100.0000 mg | ORAL_TABLET | Freq: Every day | ORAL | 3 refills | Status: DC

## 2024-09-26 MED ORDER — GABAPENTIN 300 MG PO CAPS: 300.0000 mg | ORAL_CAPSULE | Freq: Every day | ORAL | 3 refills | Status: DC

## 2024-09-26 MED ORDER — HYDROXYZINE HCL 25 MG PO TABS: 25.0000 mg | ORAL_TABLET | Freq: Two times a day (BID) | ORAL | 5 refills | Status: DC | PRN

## 2024-09-26 MED ORDER — ASPIRIN 81 MG PO TBEC: 81.0000 mg | DELAYED_RELEASE_TABLET | Freq: Every day | ORAL | 3 refills | Status: DC

## 2024-09-26 MED ORDER — CLONIDINE HCL 0.1 MG PO TABS: 0.1000 mg | ORAL_TABLET | Freq: Two times a day (BID) | ORAL | 5 refills | Status: DC

## 2024-09-26 MED ORDER — AMLODIPINE BESYLATE 10 MG PO TABS: 10.0000 mg | ORAL_TABLET | Freq: Every morning | ORAL | 3 refills | Status: DC

## 2024-09-26 MED ORDER — ATORVASTATIN CALCIUM 40 MG PO TABS: 40.0000 mg | ORAL_TABLET | Freq: Every day | ORAL | 3 refills | Status: DC

## 2024-09-26 MED ORDER — ALBUTEROL SULFATE HFA 108 (90 BASE) MCG/ACT IN AERS: 2.0000 | INHALATION_SPRAY | Freq: Four times a day (QID) | RESPIRATORY_TRACT | 2 refills | Status: DC | PRN

## 2024-09-26 NOTE — Progress Notes (Signed)
 Established Patient Office Visit  Subjective   Patient ID: Cathy [name], female    DOB: [birth year]  Age: 43 y.o. MRN: [id number]  Chief Complaint  Patient presents with   Medical Management of Chronic Issues    Diarrhea,weak no appetite no fever that pt is aware of     HPI  HPI: Cathy [name] is a 43 y.o. female with medical history significant of ESRD on hemodialysis, Tuesday, Thursday and Saturday, cirrhosis, depression, lupus, history of pulmonary embolism and bipolar/ depression with presented with altered mental status.  Patient was found down by her family. Unknown for how long patient was on the floor. EMS was called, she wad found confused and febrile, then she was transported to the ED.  On direct questioning she mentions falling down while trying to go the bathroom, unclear if she loss her consciousness or had a head trauma.  Reports having abdominal pain since this morning, constant, severe in intensity and associated with abdominal distention.  Currently she is having thirst, no nausea or vomiting.    Recent hospitalization 09/24 to 08/26/24 for volume overload, hyperkalemia and acidosis. She underwent hemodialysis with ultrafiltration with improvement in her symptoms. HD access is left upper extremity fistula.  She required large volume paracentesis 5,9 L.  Apparently her next HD was scheduled on 08/27/24 but not clear if she went to the outpatient HD unit.  Patient Active Problem List   Diagnosis Date Noted   SBP (spontaneous bacterial peritonitis) (HCC) 08/28/2024   Hypoglycemia 08/28/2024   Neuropathy 08/25/2024   Dental abscess 06/16/2024   Difficulty sleeping 06/16/2024   Acute metabolic encephalopathy 05/31/2024   Hyperammonemia 05/31/2024   NASH (nonalcoholic steatohepatitis) 05/31/2024   Abnormal pleural fluid 05/31/2024   Prolonged QT interval 05/31/2024   Cellulitis 02/20/2024   Chronic migraine w/o aura w/o status migrainosus, not intractable  12/18/2023   Vaginal discharge 12/18/2023   Elevated ferritin 10/19/2023   SLE (systemic lupus erythematosus related syndrome) (HCC) 10/19/2023   Hypertension 08/27/2023   Dyspnea 08/27/2023   Acute on chronic clinical systolic heart failure (HCC) 10/27/2022   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 cor pulmonale (HCC) 10/27/2022   Gastroesophageal reflux disease    Dysphagia    Abnormal MRI, liver    Cirrhosis of liver with ascites (HCC)    Acute hyperkalemia 06/06/2022   Abdominal pain 03/17/2022   Hypothyroidism, unspecified 03/05/2022   Tense ascites 03/02/2022   Volume overload 03/01/2022   Tobacco use 01/29/2022   Pulmonary embolism (HCC) 01/09/2022   History of pulmonary embolus 01/07/2022   Malignant hypertension 01/06/2022   Anemia due to chronic kidney disease 12/28/2021   Chronic systolic CHF (congestive heart failure) (HCC) 12/27/2021   Sepsis (HCC) 12/13/2021   Benign essential HTN 06/08/2021   HFrEF (heart failure with reduced ejection fraction) (HCC)    Nephrotic syndrome with focal and segmental glomerular lesions 03/12/2021   Encounter for antineoplastic chemotherapy 01/07/2021   End-stage renal disease on hemodialysis (HCC) 12/23/2020   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   Anxiety and depression 12/14/2020   Iron  deficiency anemia, unspecified 12/14/2020   Systemic lupus erythematosus, unspecified (HCC) 12/14/2020   Secondary hyperparathyroidism of renal origin 12/14/2020   Pericardial effusion-chronic     Interstitial edema 11/07/2020   Pelvic adhesive disease 02/03/2019   Renal hypertension 02/02/2019   Non compliance w medication regimen 01/12/2019   Anemia 10/12/2018   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 Exam conducted with a chaperone present.  Constitutional:      General: She is not in acute distress.    Appearance: Normal appearance. She is ill-appearing. She is not diaphoretic.  HENT:     Head: Normocephalic.     Right Ear: Tympanic membrane, ear canal and external ear normal.     Left Ear: Tympanic membrane, ear canal and external ear normal.     Nose: Nose normal.     Mouth/Throat:     Mouth: Mucous membranes are dry.     Pharynx: Oropharynx is clear.  Eyes:     Extraocular Movements: Extraocular movements intact.     Conjunctiva/sclera: Conjunctivae normal.     Pupils: Pupils are equal, round, and reactive to light.  Cardiovascular:     Rate and Rhythm: Normal rate.     Pulses: Normal pulses.     Heart sounds: Normal heart sounds.  Pulmonary:     Effort: Pulmonary effort is normal. No respiratory distress.     Breath sounds: Normal breath sounds.  Abdominal:     General: Abdomen is protuberant. Bowel sounds are increased.     Palpations: Abdomen is soft.     Tenderness: There is generalized abdominal tenderness. There is no right CVA tenderness or left CVA tenderness.  Skin:    General: Skin is warm and dry.  Neurological:     Mental Status: She is alert.  Psychiatric:        Mood and Affect: Mood normal.        Behavior: Behavior normal.        Thought Content: Thought content normal.      No results found for any visits on 09/26/24.    The ASCVD Risk score (Arnett DK, et al., 2019) failed to calculate for the following reasons:   Risk score cannot be calculated because patient has a medical history suggesting prior/existing ASCVD    Assessment & Plan:   Problem  List Items Addressed This Visit   None Visit Diagnoses       Diarrhea, unspecified type    -  Primary   Acute diarrhea likely due to recent antibiotic use during dialysis. - Recommend Imodium for no longer than two days. - Advised BRAT diet   Relevant Medications   loperamide (IMODIUM A-D) 2 MG tablet       Return in about 6 months (around 03/27/2025) for chronic follow-up with PCP.    Leita Longs, FNP

## 2024-09-27 ENCOUNTER — Inpatient Hospital Stay: Admitting: Internal Medicine

## 2024-09-29 DIAGNOSIS — B9561 Methicillin susceptible Staphylococcus aureus infection as the cause of diseases classified elsewhere: Secondary | ICD-10-CM | POA: Diagnosis not present

## 2024-09-29 DIAGNOSIS — N186 End stage renal disease: Secondary | ICD-10-CM | POA: Diagnosis not present

## 2024-09-29 DIAGNOSIS — T80212A Local infection due to central venous catheter, initial encounter: Secondary | ICD-10-CM | POA: Diagnosis not present

## 2024-09-29 DIAGNOSIS — Z992 Dependence on renal dialysis: Secondary | ICD-10-CM | POA: Diagnosis not present

## 2024-10-09 ENCOUNTER — Encounter (HOSPITAL_COMMUNITY): Payer: Self-pay

## 2024-10-09 ENCOUNTER — Other Ambulatory Visit: Payer: Self-pay

## 2024-10-09 ENCOUNTER — Emergency Department (HOSPITAL_COMMUNITY)

## 2024-10-09 ENCOUNTER — Emergency Department (HOSPITAL_COMMUNITY): Admission: EM | Admit: 2024-10-09 | Discharge: 2024-10-09 | Disposition: A | Discharge status: Home / Self Care

## 2024-10-09 DIAGNOSIS — Z992 Dependence on renal dialysis: Secondary | ICD-10-CM | POA: Insufficient documentation

## 2024-10-09 DIAGNOSIS — Z7982 Long term (current) use of aspirin: Secondary | ICD-10-CM | POA: Diagnosis not present

## 2024-10-09 DIAGNOSIS — K5900 Constipation, unspecified: Secondary | ICD-10-CM | POA: Insufficient documentation

## 2024-10-09 DIAGNOSIS — N186 End stage renal disease: Secondary | ICD-10-CM | POA: Diagnosis not present

## 2024-10-09 DIAGNOSIS — R109 Unspecified abdominal pain: Secondary | ICD-10-CM

## 2024-10-09 DIAGNOSIS — R197 Diarrhea, unspecified: Secondary | ICD-10-CM | POA: Insufficient documentation

## 2024-10-09 DIAGNOSIS — R140 Abdominal distension (gaseous): Secondary | ICD-10-CM | POA: Diagnosis not present

## 2024-10-09 DIAGNOSIS — R110 Nausea: Secondary | ICD-10-CM

## 2024-10-09 LAB — HEPATIC FUNCTION PANEL
ALT: <5 U/L (ref 0–44)
AST: 22 U/L (ref 15–41)
Albumin: 3 g/dL — ABNORMAL LOW (ref 3.5–5.0)
Alkaline Phosphatase: 442 U/L — ABNORMAL HIGH (ref 38–126)
Bilirubin, Direct: 0.5 mg/dL — ABNORMAL HIGH (ref 0.0–0.2)
Indirect Bilirubin: 0.3 mg/dL (ref 0.3–0.9)
Total Bilirubin: 0.8 mg/dL (ref 0.0–1.2)
Total Protein: 6.5 g/dL (ref 6.5–8.1)

## 2024-10-09 LAB — CBC WITH DIFFERENTIAL/PLATELET
Abs Immature Granulocytes: 0.1 K/uL — ABNORMAL HIGH (ref 0.00–0.07)
Basophils Absolute: 0 K/uL (ref 0.0–0.1)
Basophils Relative: 1 %
Eosinophils Absolute: 0.2 K/uL (ref 0.0–0.5)
Eosinophils Relative: 3 %
HCT: 26.6 % — ABNORMAL LOW (ref 36.0–46.0)
Hemoglobin: 7.8 g/dL — ABNORMAL LOW (ref 12.0–15.0)
Immature Granulocytes: 1 %
Lymphocytes Relative: 4 %
Lymphs Abs: 0.3 K/uL — ABNORMAL LOW (ref 0.7–4.0)
MCH: 26.4 pg (ref 26.0–34.0)
MCHC: 29.3 g/dL — ABNORMAL LOW (ref 30.0–36.0)
MCV: 89.9 fL (ref 80.0–100.0)
Monocytes Absolute: 0.7 K/uL (ref 0.1–1.0)
Monocytes Relative: 8 %
Neutro Abs: 6.8 K/uL (ref 1.7–7.7)
Neutrophils Relative %: 83 %
Platelets: 310 K/uL (ref 150–400)
RBC: 2.96 MIL/uL — ABNORMAL LOW (ref 3.87–5.11)
RDW: 16.5 % — ABNORMAL HIGH (ref 11.5–15.5)
WBC: 8.1 K/uL (ref 4.0–10.5)
nRBC: 0 % (ref 0.0–0.2)

## 2024-10-09 LAB — BASIC METABOLIC PANEL WITH GFR
Anion gap: 16 — ABNORMAL HIGH (ref 5–15)
BUN: 21 mg/dL — ABNORMAL HIGH (ref 6–20)
CO2: 29 mmol/L (ref 22–32)
Calcium: 7.7 mg/dL — ABNORMAL LOW (ref 8.9–10.3)
Chloride: 97 mmol/L — ABNORMAL LOW (ref 98–111)
Creatinine, Ser: 6.12 mg/dL — ABNORMAL HIGH (ref 0.44–1.00)
GFR, Estimated: 8 mL/min — ABNORMAL LOW (ref >60)
Glucose, Bld: 74 mg/dL (ref 70–99)
Potassium: 2.9 mmol/L — ABNORMAL LOW (ref 3.5–5.1)
Sodium: 141 mmol/L (ref 135–145)

## 2024-10-09 LAB — LIPASE, BLOOD: Lipase: 32 U/L (ref 11–51)

## 2024-10-09 MED ORDER — METHOCARBAMOL 500 MG PO TABS: 500.0000 mg | ORAL_TABLET | Freq: Four times a day (QID) | ORAL | 0 refills | Status: DC | PRN

## 2024-10-09 MED ORDER — ONDANSETRON HCL 4 MG PO TABS: 4.0000 mg | ORAL_TABLET | Freq: Three times a day (TID) | ORAL | 0 refills | Status: AC | PRN

## 2024-10-09 MED ORDER — ONDANSETRON HCL 4 MG/2ML IJ SOLN
4.0000 mg | Freq: Once | INTRAMUSCULAR | Status: AC
  Administered 2024-10-09: 4 mg via INTRAVENOUS
  Filled 2024-10-09: qty 2

## 2024-10-09 MED ORDER — FENTANYL CITRATE (PF) 100 MCG/2ML IJ SOLN
25.0000 ug | Freq: Once | INTRAMUSCULAR | Status: AC
  Administered 2024-10-09: 25 ug via INTRAVENOUS
  Filled 2024-10-09: qty 2

## 2024-10-09 NOTE — ED Triage Notes (Signed)
 Pt [facility] EMS c/o N/V/D for past couple of days; symptoms have been intermittent for 3 weeks; pt states she's had pains in her legs and feet; new onset headaches. Pt states pain is 10/10; dialysis pt., last dialysis yesterday, no issues during procedure noted. A&Ox4.

## 2024-10-09 NOTE — Discharge Instructions (Signed)
 Take your Zofran  as needed for nausea and vomiting.  You can use your Robaxin  as needed for pain.  Follow-up with your primary care doctor next week and continue going to dialysis as scheduled.

## 2024-10-09 NOTE — ED Provider Notes (Signed)
 [place] [facility] AT [facility] Provider Note   CSN: [id number] Arrival date & time: 10/09/24  9268     Patient presents with: Abdominal Pain   Cathy [name] is a 43 y.o. female.   43 year old female with end-stage renal disease on dialysis Tuesday Thursday Saturday, last 1 yesterday is here for nausea vomiting and diarrhea.  States she has not been urinating as often.  States she has not urinated in 3 weeks.  Has also noticed some abdominal distention.  States she is unable to tolerate any p.o., and does not no longer having diarrhea but not having bowel movements at all.  States he is also nauseous and still vomiting.  Denies any other symptoms or concerns.   Abdominal Pain Associated symptoms: constipation, diarrhea, nausea and vomiting   Associated symptoms: no chest pain, no chills, no cough, no dysuria, no fever, no hematuria, no shortness of breath and no sore throat        Prior to Admission medications   Medication Sig Start Date End Date Taking? Authorizing Provider  methocarbamol  (ROBAXIN ) 500 MG tablet Take 1 tablet (500 mg total) by mouth every 6 (six) hours as needed for muscle spasms. 10/09/24  Yes Latisia Hilaire L, DO  ondansetron  (ZOFRAN ) 4 MG tablet Take 1 tablet (4 mg total) by mouth every 8 (eight) hours as needed for up to 4 days. 10/09/24 10/13/24 Yes Emmaleah Meroney L, DO  acetaminophen  (TYLENOL ) 325 MG tablet Take 2 tablets (650 mg total) by mouth every 6 (six) hours as needed for mild pain (pain score 1-3) or fever (or Fever >/= 101). 09/01/24   Pearlean Manus, MD  albuterol  (VENTOLIN  HFA) 108 (90 Base) MCG/ACT inhaler Inhale 2 puffs into the lungs every 6 (six) hours as needed for wheezing or shortness of breath. 09/26/24   Bevely Doffing, FNP  amLODipine  (NORVASC ) 10 MG tablet Take 1 tablet (10 mg total) by mouth in the morning. 09/26/24   Bevely Doffing, FNP  aspirin  EC 81 MG tablet Take 1 tablet (81 mg total) by mouth daily.  Swallow whole. 09/26/24   Bevely Doffing, FNP  atorvastatin  (LIPITOR) 40 MG tablet Take 1 tablet (40 mg total) by mouth daily. 09/26/24   Bevely Doffing, FNP  Blood Pressure Monitoring (OMRON 3 SERIES BP MONITOR) DEVI Use as directed 2 times a day 08/27/23   Del Wilhelmena Lloyd Sola, FNP  busPIRone  (BUSPAR ) 7.5 MG tablet Take 1 tablet (7.5 mg total) by mouth 2 (two) times daily. 09/26/24   Bevely Doffing, FNP  carvedilol  (COREG ) 12.5 MG tablet Take 1 tablet (12.5 mg total) by mouth 2 (two) times daily with a meal. 09/26/24   Bevely Doffing, FNP  ceFAZolin  (ANCEF ) IVPB Inject 2 g into the vein Every Tuesday,Thursday,and Saturday with dialysis. Indication:  MSSA bacteremia Last Day of Therapy:  10/10/24 Labs - Once weekly:  CBC/D and BMP, ESR and CRP Method of administration: Per HD protocol - to be given at the patient's HD center 08/30/24 10/10/24  Vu, Constance T, MD  cloNIDine  (CATAPRES ) 0.1 MG tablet Take 1 tablet (0.1 mg total) by mouth 2 (two) times daily. For BP 09/26/24   Bevely Doffing, FNP  gabapentin  (NEURONTIN ) 300 MG capsule Take 1 capsule (300 mg total) by mouth at bedtime. 09/26/24   Bevely Doffing, FNP  hydrALAZINE  (APRESOLINE ) 100 MG tablet Take 1 tablet (100 mg total) by mouth 3 (three) times daily. 09/26/24   Bevely Doffing, FNP  hydrOXYzine  (ATARAX ) 25 MG tablet Take 1  tablet (25 mg total) by mouth every 12 (twelve) hours as needed for anxiety or itching. 09/26/24   Bevely Doffing, FNP  levETIRAcetam  (KEPPRA ) 250 MG tablet Take 1 tablet (250 mg total) by mouth See admin instructions. Take 2 tablets (500 mg total) by mouth daily AND Take extra 250mg  dose after dialysis on Tuesday, Thursday, and Saturday. 09/01/24   Pearlean Manus, MD  loperamide (IMODIUM A-D) 2 MG tablet Take 1 tablet (2 mg total) by mouth 3 (three) times daily as needed for diarrhea or loose stools. 09/26/24   Bevely Doffing, FNP  losartan  (COZAAR ) 100 MG tablet Take 1 tablet (100 mg total) by mouth daily. 09/26/24    Bevely Doffing, FNP  oxyCODONE  (OXY IR/ROXICODONE ) 5 MG immediate release tablet Take 1 tablet (5 mg total) by mouth every 8 (eight) hours as needed for severe pain (pain score 7-10). 08/26/24   Ricky Fines, MD  pantoprazole  (PROTONIX ) 40 MG tablet Take 1 tablet (40 mg total) by mouth daily before breakfast. 09/26/24   Bevely Doffing, FNP    Allergies: Dialyvite 800 [nephro-vite]    Review of Systems  Constitutional:  Negative for chills and fever.  HENT:  Negative for ear pain and sore throat.   Eyes:  Negative for pain and visual disturbance.  Respiratory:  Negative for cough and shortness of breath.   Cardiovascular:  Negative for chest pain and palpitations.  Gastrointestinal:  Positive for abdominal pain, constipation, diarrhea, nausea and vomiting.  Genitourinary:  Negative for dysuria and hematuria.  Musculoskeletal:  Negative for arthralgias and back pain.  Skin:  Negative for color change and rash.  Neurological:  Negative for seizures and syncope.  All other systems reviewed and are negative.   Updated Vital Signs BP (!) 147/86   Pulse 74   Temp 97.8 F (36.6 C) (Oral)   Resp (!) 25   Ht 5' 7 (1.702 m)   Wt 63.5 kg   LMP 04/07/2020 (Within Months)   SpO2 93%   BMI 21.93 kg/m   Physical Exam Vitals and nursing note reviewed.  Constitutional:      General: She is not in acute distress.    Appearance: She is well-developed. She is ill-appearing.     Comments: Chronically ill-appearing  HENT:     Head: Normocephalic and atraumatic.  Eyes:     Conjunctiva/sclera: Conjunctivae normal.  Cardiovascular:     Rate and Rhythm: Normal rate and regular rhythm.     Heart sounds: No murmur heard. Pulmonary:     Effort: Pulmonary effort is normal. No respiratory distress.     Breath sounds: Normal breath sounds.  Abdominal:     General: Abdomen is protuberant. There is distension.     Palpations: Abdomen is soft.     Tenderness: There is generalized abdominal  tenderness.  Musculoskeletal:        General: No swelling.     Cervical back: Neck supple.  Skin:    General: Skin is warm and dry.     Capillary Refill: Capillary refill takes less than 2 seconds.  Neurological:     Mental Status: She is alert.  Psychiatric:        Mood and Affect: Mood normal.     (all labs ordered are listed, but only abnormal results are displayed) Labs Reviewed  BASIC METABOLIC PANEL WITH GFR - Abnormal; Notable for the following components:      Result Value   Potassium 2.9 (*)    Chloride 97 (*)  BUN 21 (*)    Creatinine, Ser 6.12 (*)    Calcium  7.7 (*)    GFR, Estimated 8 (*)    Anion gap 16 (*)    All other components within normal limits  CBC WITH DIFFERENTIAL/PLATELET - Abnormal; Notable for the following components:   RBC 2.96 (*)    Hemoglobin 7.8 (*)    HCT 26.6 (*)    MCHC 29.3 (*)    RDW 16.5 (*)    Lymphs Abs 0.3 (*)    Abs Immature Granulocytes 0.10 (*)    All other components within normal limits  HEPATIC FUNCTION PANEL - Abnormal; Notable for the following components:   Albumin  3.0 (*)    Alkaline Phosphatase 442 (*)    Bilirubin, Direct 0.5 (*)    All other components within normal limits  LIPASE, BLOOD  URINALYSIS, W/ REFLEX TO CULTURE (INFECTION SUSPECTED)    EKG: EKG Interpretation Date/Time:  Sunday October 09 2024 08:28:35 EST Ventricular Rate:  74 PR Interval:  173 QRS Duration:  85 QT Interval:  483 QTC Calculation: 536 R Axis:   68  Text Interpretation: Sinus rhythm Probable left atrial enlargement Low voltage, extremity leads Probable anterolateral infarct, old Prolonged QTc interval Compared with prior EKG from 08/29/2024 Confirmed by Gennaro Bouchard ([id number]) on 10/09/2024 8:32:01 AM  Radiology: CT ABDOMEN PELVIS WO CONTRAST Result Date: 10/09/2024 EXAM: CT ABDOMEN AND PELVIS WITHOUT CONTRAST 10/09/2024 08:18:30 AM TECHNIQUE: CT of the abdomen and pelvis was performed without the administration of intravenous  contrast. Multiplanar reformatted images are provided for review. Automated exposure control, iterative reconstruction, and/or weight-based adjustment of the mA/kV was utilized to reduce the radiation dose to as low as reasonably achievable. COMPARISON: 08/28/2024 CLINICAL HISTORY: abd pain, n/v, constipation, abd distention FINDINGS: LOWER CHEST: Persistent area of peripheral subpleural consolidation overlying the anterolateral right lung base, image 9/4. This appears to be a chronic abnormality and is unchanged dating back to 07/05/2023. SABRA Subpleural nodule in the posterior right base measures 4 mm. New from 02/01/2024. LIVER: The liver is unremarkable. GALLBLADDER AND BILE DUCTS: Gallbladder is unremarkable. No biliary ductal dilatation. SPLEEN: The spleen is within normal limits in size and appearance. PANCREAS: The pancreas is normal in size and contour without focal lesion or ductal dilatation. ADRENAL GLANDS: Normal size and morphology bilaterally. No nodule, thickening, or hemorrhage. No periadrenal stranding. KIDNEYS, URETERS AND BLADDER: Bilateral renal cortical atrophy. Decompressed urinary bladder. No stones in the kidneys or ureters. No hydronephrosis. No perinephric or periureteral stranding. GI AND BOWEL: Stomach appears normal. Small metallic artifact foreign body identified in the expected location of the proximal duodenum may represent an ingested foreign body and is of uncertain clinical significance but appears new from 08/28/2024. Additional small metallic densities within the right abdominal small bowel loops of uncertain significance. No abnormal dilatation of the large or small bowel loops. SABRA PERITONEUM AND RETROPERITONEUM: Large volume of ascites is noted within the abdomen and pelvis, similar to the previous exam. No signs of loculated fluid. No signs of pneumoperitoneum. VASCULATURE: Aorta is normal in caliber. Extensive aortic atherosclerotic calcifications. LYMPH NODES: No  lymphadenopathy. REPRODUCTIVE ORGANS: The uterus is unremarkable. There is a right tubal ligation clip in place. The left tubal ligation clip has migrated into the left upper quadrant of the abdomen. BONES AND SOFT TISSUES: No acute or suspicious osseous findings. No focal soft tissue abnormality. IMPRESSION: 1. Large volume ascites, similar to prior exam. 2. No signs of bowel obstruction. 3. Small metallic density in  the proximal duodenum, new from 08/28/2024. Additional small metallic densities within right abdominal small bowel loops these are of uncertain clinical significance query ingestion of foreign body. 4. Left tubal ligation clip migrated into the left upper quadrant. 5. New 4 mm nodule within the right base. Likely postinflammatory or infectious. In a patient who was at low risk, no further follow-up is indicated. If the patient is at increased risk a follow-up CT of the chest without contrast material in 12 months may be considered 6. Aortic atherosclerotic calcifications 7. Cardiac enlargement and left pleural effusion. Electronically signed by: Waddell Calk MD 10/09/2024 08:33 AM EST RP Workstation: [id number]     Procedures   Medications Ordered in the ED  ondansetron  (ZOFRAN ) injection 4 mg (4 mg Intravenous Given 10/09/24 0821)  fentaNYL  (SUBLIMAZE ) injection 25 mcg (25 mcg Intravenous Given 10/09/24 0821)                                    Medical Decision Making Cardiac monitor interpretation: Sinus rhythm, no ectopy  Patient here for ongoing abdominal pain as well as some vomiting and diarrhea.  Her vitals are stable and lab workup is unremarkable.  Her creatinine is baseline.  CT abdomen pelvis is unremarkable.  I gave her Zofran  fentanyl  and fluids here and she is feeling much better.  Will give prescription for Zofran  and Robaxin  to use as needed.  Advise close follow-up with her primary care doctor and continuing dialysis as scheduled.  Advised to return to the ER for any new  or worsening symptoms.  Patient feels comfortable to plan be discharged home.  Problems Addressed: Abdominal pain, non-surgical: undiagnosed new problem with uncertain prognosis End stage renal disease (HCC): chronic illness or injury Nausea: acute illness or injury  Amount and/or Complexity of Data Reviewed External Data Reviewed: notes.    Details: Prior hospital records reviewed and patient was admitted 37 - 20 - 25 for sepsis Labs: ordered. Decision-making details documented in ED Course.    Details: Ordered and reviewed by me and creatinine is baseline labs are fairly unremarkable otherwise Radiology: ordered and independent interpretation performed. Decision-making details documented in ED Course.    Details: Ordered and reviewed by me CT abdomen pelvis: Shows no acute abnormality in the abdomen, patient has ascites but this is at her baseline ECG/medicine tests: ordered and independent interpretation performed. Decision-making details documented in ED Course. Discussion of management or test interpretation with external provider(s): Ordered and interpreted by me in the absence of cardiology and shows sinus rhythm, no STEMI or significant change when compared to prior EKG  Risk OTC drugs. Prescription drug management. Parenteral controlled substances. Drug therapy requiring intensive monitoring for toxicity. Diagnosis or treatment significantly limited by social determinants of health.     Final diagnoses:  Abdominal pain, non-surgical  Nausea  End stage renal disease [facility])    ED Discharge Orders          Ordered    ondansetron  (ZOFRAN ) 4 MG tablet  Every 8 hours PRN        10/09/24 0925    methocarbamol  (ROBAXIN ) 500 MG tablet  Every 6 hours PRN        10/09/24 0925               Ki Luckman L, DO 10/09/24 1433

## 2024-10-13 ENCOUNTER — Ambulatory Visit: Admitting: Family Medicine

## 2024-11-06 ENCOUNTER — Inpatient Hospital Stay (HOSPITAL_COMMUNITY)
Admission: EM | Admit: 2024-11-06 | Discharge: 2024-11-09 | DRG: 682 | Disposition: A | Attending: Family Medicine | Admitting: Family Medicine

## 2024-11-06 ENCOUNTER — Encounter (HOSPITAL_COMMUNITY): Payer: Self-pay | Admitting: Family Medicine

## 2024-11-06 ENCOUNTER — Inpatient Hospital Stay (HOSPITAL_COMMUNITY)

## 2024-11-06 ENCOUNTER — Emergency Department (HOSPITAL_COMMUNITY)

## 2024-11-06 DIAGNOSIS — N186 End stage renal disease: Secondary | ICD-10-CM

## 2024-11-06 DIAGNOSIS — R52 Pain, unspecified: Principal | ICD-10-CM

## 2024-11-06 DIAGNOSIS — N041 Nephrotic syndrome with focal and segmental glomerular lesions: Secondary | ICD-10-CM | POA: Diagnosis present

## 2024-11-06 DIAGNOSIS — R7989 Other specified abnormal findings of blood chemistry: Secondary | ICD-10-CM

## 2024-11-06 DIAGNOSIS — Z992 Dependence on renal dialysis: Secondary | ICD-10-CM

## 2024-11-06 DIAGNOSIS — I509 Heart failure, unspecified: Secondary | ICD-10-CM | POA: Diagnosis not present

## 2024-11-06 DIAGNOSIS — J90 Pleural effusion, not elsewhere classified: Secondary | ICD-10-CM

## 2024-11-06 DIAGNOSIS — M3212 Pericarditis in systemic lupus erythematosus: Secondary | ICD-10-CM | POA: Diagnosis present

## 2024-11-06 DIAGNOSIS — R188 Other ascites: Secondary | ICD-10-CM | POA: Diagnosis not present

## 2024-11-06 DIAGNOSIS — D631 Anemia in chronic kidney disease: Secondary | ICD-10-CM | POA: Diagnosis present

## 2024-11-06 DIAGNOSIS — Z720 Tobacco use: Secondary | ICD-10-CM | POA: Diagnosis present

## 2024-11-06 DIAGNOSIS — I2699 Other pulmonary embolism without acute cor pulmonale: Secondary | ICD-10-CM | POA: Diagnosis present

## 2024-11-06 DIAGNOSIS — E8770 Fluid overload, unspecified: Secondary | ICD-10-CM | POA: Diagnosis present

## 2024-11-06 DIAGNOSIS — I5022 Chronic systolic (congestive) heart failure: Secondary | ICD-10-CM | POA: Diagnosis present

## 2024-11-06 DIAGNOSIS — M3214 Glomerular disease in systemic lupus erythematosus: Secondary | ICD-10-CM | POA: Diagnosis present

## 2024-11-06 DIAGNOSIS — M329 Systemic lupus erythematosus, unspecified: Secondary | ICD-10-CM | POA: Diagnosis present

## 2024-11-06 DIAGNOSIS — R0600 Dyspnea, unspecified: Secondary | ICD-10-CM

## 2024-11-06 DIAGNOSIS — R140 Abdominal distension (gaseous): Secondary | ICD-10-CM

## 2024-11-06 DIAGNOSIS — I10 Essential (primary) hypertension: Secondary | ICD-10-CM | POA: Diagnosis present

## 2024-11-06 DIAGNOSIS — R1084 Generalized abdominal pain: Secondary | ICD-10-CM

## 2024-11-06 DIAGNOSIS — R079 Chest pain, unspecified: Secondary | ICD-10-CM

## 2024-11-06 HISTORY — DX: Disorder of kidney and ureter, unspecified: N28.9

## 2024-11-06 LAB — CBC WITH DIFFERENTIAL/PLATELET
Abs Immature Granulocytes: 0.04 K/uL (ref 0.00–0.07)
Basophils Absolute: 0.1 K/uL (ref 0.0–0.1)
Basophils Relative: 1 %
Eosinophils Absolute: 0.1 K/uL (ref 0.0–0.5)
Eosinophils Relative: 3 %
HCT: 28.8 % — ABNORMAL LOW (ref 36.0–46.0)
Hemoglobin: 8.5 g/dL — ABNORMAL LOW (ref 12.0–15.0)
Immature Granulocytes: 1 %
Lymphocytes Relative: 13 %
Lymphs Abs: 0.7 K/uL (ref 0.7–4.0)
MCH: 27.1 pg (ref 26.0–34.0)
MCHC: 29.5 g/dL — ABNORMAL LOW (ref 30.0–36.0)
MCV: 91.7 fL (ref 80.0–100.0)
Monocytes Absolute: 0.5 K/uL (ref 0.1–1.0)
Monocytes Relative: 9 %
Neutro Abs: 4.1 K/uL (ref 1.7–7.7)
Neutrophils Relative %: 73 %
Platelets: 279 K/uL (ref 150–400)
RBC: 3.14 MIL/uL — ABNORMAL LOW (ref 3.87–5.11)
RDW: 19.3 % — ABNORMAL HIGH (ref 11.5–15.5)
WBC: 5.5 K/uL (ref 4.0–10.5)
nRBC: 0.4 % — ABNORMAL HIGH (ref 0.0–0.2)

## 2024-11-06 LAB — PRO BRAIN NATRIURETIC PEPTIDE: Pro Brain Natriuretic Peptide: >35000 pg/mL — ABNORMAL HIGH (ref <300.0)

## 2024-11-06 LAB — COMPREHENSIVE METABOLIC PANEL WITH GFR
ALT: 8 U/L (ref 0–44)
AST: 21 U/L (ref 15–41)
Albumin: 3.8 g/dL (ref 3.5–5.0)
Alkaline Phosphatase: 372 U/L — ABNORMAL HIGH (ref 38–126)
Anion gap: 20 — ABNORMAL HIGH (ref 5–15)
BUN: 56 mg/dL — ABNORMAL HIGH (ref 6–20)
CO2: 25 mmol/L (ref 22–32)
Calcium: 7.4 mg/dL — ABNORMAL LOW (ref 8.9–10.3)
Chloride: 95 mmol/L — ABNORMAL LOW (ref 98–111)
Creatinine, Ser: 9.84 mg/dL — ABNORMAL HIGH (ref 0.44–1.00)
GFR, Estimated: 5 mL/min — ABNORMAL LOW (ref >60)
Glucose, Bld: 77 mg/dL (ref 70–99)
Potassium: 5.1 mmol/L (ref 3.5–5.1)
Sodium: 140 mmol/L (ref 135–145)
Total Bilirubin: 0.5 mg/dL (ref 0.0–1.2)
Total Protein: 7.3 g/dL (ref 6.5–8.1)

## 2024-11-06 LAB — RESP PANEL BY RT-PCR (RSV, FLU A&B, COVID)  RVPGX2
Influenza A by PCR: NEGATIVE
Influenza B by PCR: NEGATIVE
Resp Syncytial Virus by PCR: NEGATIVE
SARS Coronavirus 2 by RT PCR: NEGATIVE

## 2024-11-06 LAB — D-DIMER, QUANTITATIVE: D-Dimer, Quant: 11.76 ug{FEU}/mL — ABNORMAL HIGH (ref 0.00–0.50)

## 2024-11-06 LAB — TROPONIN T, HIGH SENSITIVITY
Troponin T High Sensitivity: 626 ng/L (ref 0–19)
Troponin T High Sensitivity: 567 ng/L (ref 0–19)

## 2024-11-06 LAB — HCG, SERUM, QUALITATIVE: Preg, Serum: NEGATIVE

## 2024-11-06 MED ORDER — CHLORHEXIDINE GLUCONATE CLOTH 2 % EX PADS
6.0000 | MEDICATED_PAD | Freq: Every day | CUTANEOUS | Status: DC
  Administered 2024-11-08 – 2024-11-09 (×2): 6 via TOPICAL

## 2024-11-06 MED ORDER — BISACODYL 10 MG RE SUPP: 10.0000 mg | Freq: Every day | RECTAL | Status: DC | PRN

## 2024-11-06 MED ORDER — LABETALOL HCL 5 MG/ML IV SOLN: 10.0000 mg | INTRAVENOUS | Status: DC | PRN

## 2024-11-06 MED ORDER — GABAPENTIN 300 MG PO CAPS
300.0000 mg | ORAL_CAPSULE | Freq: Every day | ORAL | Status: DC
  Administered 2024-11-06 – 2024-11-08 (×3): 300 mg via ORAL
  Filled 2024-11-06 (×3): qty 1

## 2024-11-06 MED ORDER — BUSPIRONE HCL 5 MG PO TABS
7.5000 mg | ORAL_TABLET | Freq: Two times a day (BID) | ORAL | Status: DC
  Administered 2024-11-06 – 2024-11-09 (×6): 7.5 mg via ORAL
  Filled 2024-11-06 (×3): qty 1
  Filled 2024-11-06 (×3): qty 2

## 2024-11-06 MED ORDER — HYDRALAZINE HCL 25 MG PO TABS
100.0000 mg | ORAL_TABLET | Freq: Once | ORAL | Status: AC
  Administered 2024-11-06: 100 mg via ORAL
  Filled 2024-11-06: qty 4

## 2024-11-06 MED ORDER — TECHNETIUM TO 99M ALBUMIN AGGREGATED
4.0000 | Freq: Once | INTRAVENOUS | Status: AC | PRN
  Administered 2024-11-06: 4.4 via INTRAVENOUS

## 2024-11-06 MED ORDER — HYDRALAZINE HCL 50 MG PO TABS
100.0000 mg | ORAL_TABLET | Freq: Three times a day (TID) | ORAL | Status: DC
  Administered 2024-11-07 – 2024-11-09 (×4): 100 mg via ORAL
  Filled 2024-11-06 (×4): qty 2

## 2024-11-06 MED ORDER — RISAQUAD PO CAPS
1.0000 | ORAL_CAPSULE | Freq: Three times a day (TID) | ORAL | Status: DC
  Administered 2024-11-06 – 2024-11-09 (×7): 1 via ORAL
  Filled 2024-11-06 (×7): qty 1

## 2024-11-06 MED ORDER — NICOTINE 21 MG/24HR TD PT24
21.0000 mg | MEDICATED_PATCH | Freq: Every day | TRANSDERMAL | Status: DC
  Filled 2024-11-06 (×4): qty 1

## 2024-11-06 MED ORDER — LOSARTAN POTASSIUM 25 MG PO TABS
100.0000 mg | ORAL_TABLET | Freq: Once | ORAL | Status: AC
  Administered 2024-11-06: 100 mg via ORAL
  Filled 2024-11-06: qty 4

## 2024-11-06 MED ORDER — AMLODIPINE BESYLATE 5 MG PO TABS
10.0000 mg | ORAL_TABLET | Freq: Every morning | ORAL | Status: DC
  Administered 2024-11-09: 10 mg via ORAL
  Filled 2024-11-06: qty 2

## 2024-11-06 MED ORDER — HYDROXYZINE HCL 25 MG PO TABS: 25.0000 mg | ORAL_TABLET | Freq: Two times a day (BID) | ORAL | Status: DC | PRN

## 2024-11-06 MED ORDER — CLONIDINE HCL 0.2 MG PO TABS
0.3000 mg | ORAL_TABLET | Freq: Once | ORAL | Status: AC
  Administered 2024-11-06: 0.3 mg via ORAL
  Filled 2024-11-06: qty 1

## 2024-11-06 MED ORDER — CLONIDINE HCL 0.1 MG PO TABS
0.1000 mg | ORAL_TABLET | Freq: Two times a day (BID) | ORAL | Status: DC
  Administered 2024-11-07 – 2024-11-09 (×3): 0.1 mg via ORAL
  Filled 2024-11-06 (×3): qty 1

## 2024-11-06 MED ORDER — PANTOPRAZOLE SODIUM 40 MG PO TBEC
40.0000 mg | DELAYED_RELEASE_TABLET | Freq: Every day | ORAL | Status: DC
  Administered 2024-11-07 – 2024-11-09 (×3): 40 mg via ORAL
  Filled 2024-11-06 (×3): qty 1

## 2024-11-06 MED ORDER — SODIUM CHLORIDE 0.9% FLUSH
3.0000 mL | Freq: Two times a day (BID) | INTRAVENOUS | Status: DC
  Administered 2024-11-06 – 2024-11-09 (×6): 3 mL via INTRAVENOUS

## 2024-11-06 MED ORDER — LEVETIRACETAM 250 MG PO TABS: 250.0000 mg | ORAL_TABLET | ORAL | Status: DC

## 2024-11-06 MED ORDER — CLONIDINE HCL 0.1 MG PO TABS
0.1000 mg | ORAL_TABLET | Freq: Once | ORAL | Status: AC
  Administered 2024-11-06: 0.1 mg via ORAL
  Filled 2024-11-06: qty 1

## 2024-11-06 MED ORDER — CARVEDILOL 12.5 MG PO TABS
12.5000 mg | ORAL_TABLET | Freq: Two times a day (BID) | ORAL | Status: DC
  Administered 2024-11-07 – 2024-11-09 (×2): 12.5 mg via ORAL
  Filled 2024-11-06 (×2): qty 1

## 2024-11-06 MED ORDER — TRAZODONE HCL 50 MG PO TABS: 50.0000 mg | ORAL_TABLET | Freq: Every evening | ORAL | Status: DC | PRN

## 2024-11-06 MED ORDER — METOCLOPRAMIDE HCL 5 MG/ML IJ SOLN
5.0000 mg | Freq: Once | INTRAMUSCULAR | Status: AC
  Administered 2024-11-06: 5 mg via INTRAVENOUS
  Filled 2024-11-06: qty 2

## 2024-11-06 MED ORDER — METHOCARBAMOL 500 MG PO TABS
500.0000 mg | ORAL_TABLET | Freq: Four times a day (QID) | ORAL | Status: DC | PRN
  Administered 2024-11-07 – 2024-11-08 (×2): 500 mg via ORAL
  Filled 2024-11-06 (×2): qty 1

## 2024-11-06 MED ORDER — CARVEDILOL 12.5 MG PO TABS
12.5000 mg | ORAL_TABLET | Freq: Two times a day (BID) | ORAL | Status: DC
  Administered 2024-11-06: 12.5 mg via ORAL
  Filled 2024-11-06: qty 1

## 2024-11-06 MED ORDER — LEVETIRACETAM 250 MG PO TABS
250.0000 mg | ORAL_TABLET | ORAL | Status: DC
  Administered 2024-11-08: 250 mg via ORAL
  Filled 2024-11-06: qty 1

## 2024-11-06 MED ORDER — LOPERAMIDE HCL 2 MG PO TABS: 2.0000 mg | ORAL_TABLET | Freq: Three times a day (TID) | ORAL | Status: DC | PRN

## 2024-11-06 MED ORDER — ATORVASTATIN CALCIUM 40 MG PO TABS
40.0000 mg | ORAL_TABLET | Freq: Every day | ORAL | Status: DC
  Administered 2024-11-07 – 2024-11-09 (×3): 40 mg via ORAL
  Filled 2024-11-06 (×3): qty 1

## 2024-11-06 MED ORDER — SODIUM CHLORIDE 0.9 % IV SOLN: INTRAVENOUS | Status: AC | PRN

## 2024-11-06 MED ORDER — LEVETIRACETAM 500 MG PO TABS
500.0000 mg | ORAL_TABLET | Freq: Every day | ORAL | Status: DC
  Administered 2024-11-07 – 2024-11-09 (×3): 500 mg via ORAL
  Filled 2024-11-06 (×3): qty 1

## 2024-11-06 MED ORDER — SODIUM CHLORIDE 0.9% FLUSH: 3.0000 mL | INTRAVENOUS | Status: DC | PRN

## 2024-11-06 MED ORDER — HEPARIN SODIUM (PORCINE) 5000 UNIT/ML IJ SOLN
5000.0000 [IU] | Freq: Three times a day (TID) | INTRAMUSCULAR | Status: DC
  Administered 2024-11-06: 5000 [IU] via SUBCUTANEOUS
  Filled 2024-11-06 (×5): qty 1

## 2024-11-06 MED ORDER — HYDROMORPHONE HCL 1 MG/ML IJ SOLN
0.5000 mg | Freq: Once | INTRAMUSCULAR | Status: AC
  Administered 2024-11-06: 0.5 mg via INTRAVENOUS
  Filled 2024-11-06: qty 0.5

## 2024-11-06 MED ORDER — OXYCODONE HCL 5 MG PO TABS
5.0000 mg | ORAL_TABLET | Freq: Three times a day (TID) | ORAL | Status: DC | PRN
  Administered 2024-11-06 – 2024-11-09 (×6): 5 mg via ORAL
  Filled 2024-11-06 (×7): qty 1

## 2024-11-06 MED ORDER — ALBUTEROL SULFATE (2.5 MG/3ML) 0.083% IN NEBU: 2.5000 mg | INHALATION_SOLUTION | RESPIRATORY_TRACT | Status: DC | PRN

## 2024-11-06 MED ORDER — AMLODIPINE BESYLATE 5 MG PO TABS
10.0000 mg | ORAL_TABLET | Freq: Once | ORAL | Status: AC
  Administered 2024-11-06: 10 mg via ORAL
  Filled 2024-11-06: qty 2

## 2024-11-06 MED ORDER — ASPIRIN 81 MG PO TBEC
81.0000 mg | DELAYED_RELEASE_TABLET | Freq: Every day | ORAL | Status: DC
  Administered 2024-11-07 – 2024-11-09 (×3): 81 mg via ORAL
  Filled 2024-11-06 (×3): qty 1

## 2024-11-06 MED ORDER — ACETAMINOPHEN 650 MG RE SUPP: 650.0000 mg | Freq: Four times a day (QID) | RECTAL | Status: DC | PRN

## 2024-11-06 MED ORDER — LOSARTAN POTASSIUM 50 MG PO TABS
100.0000 mg | ORAL_TABLET | Freq: Every day | ORAL | Status: DC
  Administered 2024-11-09: 100 mg via ORAL
  Filled 2024-11-06: qty 4
  Filled 2024-11-06: qty 2

## 2024-11-06 MED ORDER — ACETAMINOPHEN 325 MG PO TABS
650.0000 mg | ORAL_TABLET | Freq: Four times a day (QID) | ORAL | Status: DC | PRN
  Filled 2024-11-06: qty 2

## 2024-11-06 MED ORDER — POLYETHYLENE GLYCOL 3350 17 G PO PACK: 17.0000 g | PACK | Freq: Every day | ORAL | Status: DC | PRN

## 2024-11-06 NOTE — Assessment & Plan Note (Signed)
-   hemodialysis plans as above to help patient's volume status -

## 2024-11-06 NOTE — Assessment & Plan Note (Signed)
--  Discussed case with on-call nephrologist Dr. Melia--- anticipate hemodialysis on 11/07/2024 to help her-volume status

## 2024-11-06 NOTE — ED Triage Notes (Signed)
 Pt comes in for sob, n/v, and diarrhea. S/s been has been going on for the past few weeks. Pt though it was fluid build up. A&Ox4.   Last dialysis was last Thursday.

## 2024-11-06 NOTE — Assessment & Plan Note (Signed)
-   K=5.1 Na 140, Creatinine 9.84 ---TTS schedule  -patient appears volume overloaded -Patient missed hemodialysis on 11/05/24 - Discussed case with on-call nephrologist Dr. Melia--- anticipate hemodialysis on 11/07/2024 to help her-volume status

## 2024-11-06 NOTE — H&P (Signed)
 History and Physical   Patient: Cathy [name] FMW:[id number] DOB: [birth year] DOA: 11/06/2024 DOS: the patient was seen and examined on 11/06/2024 PCP: Terry Wilhelmena Lloyd Hilario, FNP  Patient coming from: Home  Chief Complaint:  Chief Complaint  Patient presents with   Shortness of Breath   Nausea   HPI: Cathy [name] is a 43 y.o. 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 - COVID flu and RSV negative in the ED - proBNP elevated, D-dimer chronically elevated - Initial troponin 626, repeat 567--EKG sinus rhythm with LVH Hgb 8.5 , WBC and Platelets are WNL K=5.1 Na 140, Creatinine 9.84 - CT chest, abdomen and pelvis---IMPRESSION: 1. Small bilateral pleural effusions, left greater than right, increased from prior study. 2. Large volume ascites, increased from prior study.  Review of Systems: As mentioned in the history of present illness. All other systems reviewed and are negative. Past Medical History:  Diagnosis Date   Anasarca associated with disorder of kidney 06/08/2021   Anxiety    Asthma    Bipolar depression (HCC)    Cirrhosis (HCC)    Depression    Dyspnea    ESRD (end stage renal disease) on dialysis (HCC) 10/2020   GERD (gastroesophageal reflux disease)    Gonorrhea    Lupus    Pelvic  inflammatory disease (PID)    Pleural effusion 06/08/2021   Pulmonary embolism (HCC) 01/31/2022   Renal hypertension    Trichomonas infection    Past Surgical History:  Procedure Laterality Date   A/V FISTULAGRAM N/A 02/22/2024   Procedure: A/V Fistulagram;  Surgeon: Sheree Penne Bruckner, MD;  Location[facility] INVASIVE CV LAB;  Service: Cardiovascular;  Laterality: N/A;   AV FISTULA PLACEMENT Left 06/10/2022   Procedure: LEFT ARM ARTERIOVENOUS (AV) FISTULA CREATION;  Surgeon: Oris Krystal FALCON, MD;  Location: [facility] ORS;  Service: Vascular;  Laterality: Left;   BASCILIC VEIN TRANSPOSITION Left 03/24/2023   Procedure: LEFT ARM SECOND STAGE BASILIC VEIN TRANSPOSITION;  Surgeon: Oris Krystal FALCON, MD;  Location: [facility] ORS;  Service: Vascular;  Laterality: Left;   CENTRAL LINE INSERTION-TUNNELED N/A 02/22/2024   Procedure: CENTRAL LINE INSERTION-TUNNELED;  Surgeon: Sheree Penne Bruckner, MD;  Location: [facility] INVASIVE CV LAB;  Service: Cardiovascular;  Laterality: N/A;   ESOPHAGEAL DILATION N/A 05/10/2024   Procedure: DILATION, ESOPHAGUS;  Surgeon: Eartha Angelia Sieving, MD;  Location: [facility] ENDO SUITE;  Service: Gastroenterology;  Laterality: N/A;   ESOPHAGOGASTRODUODENOSCOPY N/A 05/10/2024   Procedure: EGD (ESOPHAGOGASTRODUODENOSCOPY);  Surgeon: Eartha Angelia, Sieving, MD;  Location: [facility] ENDO SUITE;  Service: Gastroenterology;  Laterality: N/A;  8:45 am, asa 3  dialysis M,W & F   IR FLUORO GUIDE CV LINE RIGHT  11/30/2020   IR FLUORO GUIDE CV LINE RIGHT  06/13/2021   IR FLUORO GUIDE CV LINE RIGHT  02/01/2022   IR REMOVAL TUN CV CATH W/O FL  06/11/2021   IR REMOVAL TUN CV CATH W/O  FL  01/29/2022   IR US  GUIDE VASC ACCESS RIGHT  11/30/2020   IR US  GUIDE VASC ACCESS RIGHT  06/13/2021   IR US  GUIDE VASC ACCESS RIGHT  02/03/2022   RENAL BIOPSY     TEE WITHOUT CARDIOVERSION N/A 06/13/2021   Procedure: TRANSESOPHAGEAL ECHOCARDIOGRAM (TEE);  Surgeon: Delford Maude BROCKS, MD;  Location: [facility] ENDOSCOPY;  Service: Cardiovascular;   Laterality: N/A;   TEE WITHOUT CARDIOVERSION N/A 02/03/2022   Procedure: TRANSESOPHAGEAL ECHOCARDIOGRAM (TEE);  Surgeon: Tobb, Kardie, DO;  Location: [facility] ENDOSCOPY;  Service: Cardiovascular;  Laterality: N/A;   TUBAL LIGATION N/A 02/03/2019   Procedure: POST PARTUM TUBAL LIGATION;  Surgeon: Izell Harari, MD;  Location: [facility] LD ORS;  Service: Gynecology;  Laterality: N/A;   Social History:  reports that she has been smoking cigarettes. She started smoking about 25 years ago. She has a 6.5 pack-year smoking history. She has never used smokeless tobacco. She reports that she does not currently use alcohol. She reports current drug use. Frequency: 3.00 times per week. Drug: Marijuana.  Allergies  Allergen Reactions   Dialyvite 800 [Nephro-Vite] Nausea And Vomiting    Family History  Problem Relation Age of Onset   Anxiety disorder Mother    Sleep apnea Father    Diabetes Maternal Grandmother    Hypertension Maternal Grandmother    Diabetes Maternal Grandfather    Hypertension Maternal Grandfather    Diabetes Paternal Grandmother    Hypertension Paternal Grandmother    Diabetes Paternal Grandfather    Hypertension Paternal Grandfather     Prior to Admission medications   Medication Sig Start Date End Date Taking? Authorizing Provider  acetaminophen  (TYLENOL ) 325 MG tablet Take 2 tablets (650 mg total) by mouth every 6 (six) hours as needed for mild pain (pain score 1-3) or fever (or Fever >/= 101). 09/01/24   Pearlean Manus, MD  albuterol  (VENTOLIN  HFA) 108 (90 Base) MCG/ACT inhaler Inhale 2 puffs into the lungs every 6 (six) hours as needed for wheezing or shortness of breath. 09/26/24   Bevely Doffing, FNP  amLODipine  (NORVASC ) 10 MG tablet Take 1 tablet (10 mg total) by mouth in the morning. 09/26/24   Bevely Doffing, FNP  aspirin  EC 81 MG tablet Take 1 tablet (81 mg total) by mouth daily. Swallow whole. 09/26/24   Bevely Doffing, FNP  atorvastatin  (LIPITOR) 40 MG tablet Take 1 tablet  (40 mg total) by mouth daily. 09/26/24   Bevely Doffing, FNP  Blood Pressure Monitoring (OMRON 3 SERIES BP MONITOR) DEVI Use as directed 2 times a day 08/27/23   Del Wilhelmena Lloyd Sola, FNP  busPIRone  (BUSPAR ) 7.5 MG tablet Take 1 tablet (7.5 mg total) by mouth 2 (two) times daily. 09/26/24   Bevely Doffing, FNP  carvedilol  (COREG ) 12.5 MG tablet Take 1 tablet (12.5 mg total) by mouth 2 (two) times daily with a meal. 09/26/24   Huenink, Doffing, FNP  cloNIDine  (CATAPRES ) 0.1 MG tablet Take 1 tablet (0.1 mg total) by mouth 2 (two) times daily. For BP 09/26/24   Bevely Doffing, FNP  gabapentin  (NEURONTIN ) 300 MG capsule Take 1 capsule (300 mg total) by mouth at bedtime. 09/26/24   Bevely Doffing, FNP  hydrALAZINE  (APRESOLINE ) 100 MG tablet Take 1 tablet (100 mg total) by mouth 3 (three) times daily. 09/26/24   Bevely Doffing, FNP  hydrOXYzine  (ATARAX ) 25 MG tablet Take 1 tablet (25 mg total) by mouth every 12 (twelve) hours as needed for anxiety or itching. 09/26/24   Bevely Doffing, FNP  levETIRAcetam  (  KEPPRA ) 250 MG tablet Take 1 tablet (250 mg total) by mouth See admin instructions. Take 2 tablets (500 mg total) by mouth daily AND Take extra 250mg  dose after dialysis on Tuesday, Thursday, and Saturday. 09/01/24   Pearlean Manus, MD  loperamide  (IMODIUM  A-D) 2 MG tablet Take 1 tablet (2 mg total) by mouth 3 (three) times daily as needed for diarrhea or loose stools. 09/26/24   Bevely Doffing, FNP  losartan  (COZAAR ) 100 MG tablet Take 1 tablet (100 mg total) by mouth daily. 09/26/24   Bevely Doffing, FNP  methocarbamol  (ROBAXIN ) 500 MG tablet Take 1 tablet (500 mg total) by mouth every 6 (six) hours as needed for muscle spasms. 10/09/24   Kammerer, Megan L, DO  oxyCODONE  (OXY IR/ROXICODONE ) 5 MG immediate release tablet Take 1 tablet (5 mg total) by mouth every 8 (eight) hours as needed for severe pain (pain score 7-10). 08/26/24   Ricky Fines, MD  pantoprazole  (PROTONIX ) 40 MG tablet Take 1 tablet (40  mg total) by mouth daily before breakfast. 09/26/24   Bevely Doffing, FNP    Physical Exam: Vitals:   11/06/24 1531 11/06/24 1534 11/06/24 1629  BP:   (!) 210/120  Pulse: 80    Resp: 18 (!) 22   Temp:  97.6 F (36.4 C)   TempSrc:  Oral   SpO2: 95%    Weight: 63.5 kg    Height: 5' 7 (1.702 m)     Physical Exam Gen:- Awake Alert, in no acute distress  HEENT:- [place].AT, No sclera icterus Nose- [place] 2L/min Neck-Supple Neck, +ve JVD,.  Lungs-no wheezing, fair air movement bilaterally  CV- S1, S2 normal, RRR Abd-diminished bowel sounds, abdominal distention due to ascites noted, no significant tenderness,,    Extremity/Skin:- +ve  edema bilateral lower extremities,   good pedal pulses  Psych-affect is appropriate, oriented x3 Neuro-generalized weakness, no new focal deficits, no tremors MSK-left upper extremity AV fistula with positive bruit and thrill   Data Reviewed: - COVID flu and RSV negative in the ED - proBNP elevated, D-dimer chronically elevated - Initial troponin 626, repeat 567--EKG sinus rhythm with LVH Hgb 8.5 , WBC and Platelets are WNL K=5.1 Na 140, Creatinine 9.84 - CT chest, abdomen and pelvis---IMPRESSION: 1. Small bilateral pleural effusions, left greater than right, increased from prior study. 2. Large volume ascites, increased from prior study. -- VQ scan pending  Assessment and Plan: Assessment & Plan ESRD (end stage renal disease) on dialysis (HCC) End-stage renal disease on hemodialysis (HCC) - K=5.1 Na 140, Creatinine 9.84 ---TTS schedule  -patient appears volume overloaded -Patient missed hemodialysis on 11/05/24 - Discussed case with on-call nephrologist Dr. 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 Melia--- anticipate hemodialysis on 11/07/2024 to help her-volume status History of pulmonary embolus -history of pulmonary embolism (not on chronic anticoagulation due to chronic anemia and chronic thrombocytopenia)--await VQ scan to make decision on anticoagulation -D-dimer and dyspnea noted -If VQ scan positive will initiate IV heparin  drip Anemia due to chronic kidney disease Hgb currently 8.5 which  is similar to patient's usual baseline -Defer decision on EPO/ESA agent to nephrology team Tense ascites -- Plans for palliative paracentesis on 11/07/2024 by IR Tobacco use Smokes cessation advised, nicotine  patch ordered  Acute hypoxic respiratory failure--O2 sats mid to high 80s on room air - Currently requiring up to 2 L of oxygen  via nasal cannula in the setting of volume volume status - Anticipate that hypoxia and oxygen  requirement will resolve after hemodialysis and paracentesis - VQ scan to rule out PE pending  Chronic diarrhea--- diarrhea is not new she continues to have ongoing diarrhea   -Patient was recently treated for MSSA bacteremia with prolonged antibiotic course -Check stool for C. difficile - Give probiotic   Advance Care Planning:   Code Status: Full Code   Consults: Nephrologist Dr. Melia  Severity of Illness: The appropriate patient status for this patient is INPATIENT. Inpatient status is judged to be reasonable and necessary in order to provide the required intensity of service to ensure the patient's safety. The patient's presenting symptoms, physical exam findings, and initial radiographic and laboratory data in the  context of their chronic comorbidities is felt to place them at high risk for further clinical deterioration. Furthermore, it is not anticipated that the patient will be medically stable for discharge from the hospital within 2 midnights of admission.   * I certify that at the point of admission it is my clinical judgment that the patient will require inpatient hospital care spanning beyond 2 midnights from the point of admission due to high intensity of service, high risk for further deterioration and high frequency of surveillance required.*  Author: Rendall Carwin, MD 11/06/2024 7:12 PM  For on call review [email or web address].

## 2024-11-06 NOTE — ED Provider Notes (Signed)
 [place] EMERGENCY DEPARTMENT AT [facility] Provider Note   CSN: [id number] Arrival date & time: 11/06/24  1500     Patient presents with: Shortness of Breath and Nausea   Cathy [name] is a 43 y.o. female who initially presented to bring her 47-year-old daughter to the emergency department for chief complaint of right ear pain however decided to check herself into the emergency department as well.  Patient states that she has had shortness of breath for the last 1 to 2 weeks.  Denies fever chills or known sick contacts.  Patient is a dialysis patient who does not make any urine, patient states that she has dialysis Tuesday, Thursday, and Saturday.  States that she missed her session yesterday.  Patient states that her entire body is in pain and that she has chronic abdominal pain.  She also states that she has had a productive cough consisting of clear sputum.  Has medical history significant for asthma, lupus, malignant hypertension, tobacco use, ESRD on hemodialysis, heart failure with reduced ejection fraction, sepsis, anemia, history of pulmonary embolus, prolonged QT interval, etc.    Shortness of Breath      Prior to Admission medications   Medication Sig Start Date End Date Taking? Authorizing Provider  acetaminophen  (TYLENOL ) 325 MG tablet Take 2 tablets (650 mg total) by mouth every 6 (six) hours as needed for mild pain (pain score 1-3) or fever (or Fever >/= 101). 09/01/24   Pearlean Manus, MD  albuterol  (VENTOLIN  HFA) 108 (90 Base) MCG/ACT inhaler Inhale 2 puffs into the lungs every 6 (six) hours as needed for wheezing or shortness of breath. 09/26/24   Bevely Doffing, FNP  amLODipine  (NORVASC ) 10 MG tablet Take 1 tablet (10 mg total) by mouth in the morning. 09/26/24   Bevely Doffing, FNP  aspirin  EC 81 MG tablet Take 1 tablet (81 mg total) by mouth daily. Swallow whole. 09/26/24   Bevely Doffing, FNP  atorvastatin  (LIPITOR) 40 MG tablet Take 1 tablet (40 mg  total) by mouth daily. 09/26/24   Bevely Doffing, FNP  Blood Pressure Monitoring (OMRON 3 SERIES BP MONITOR) DEVI Use as directed 2 times a day 08/27/23   Del Wilhelmena Lloyd Sola, FNP  busPIRone  (BUSPAR ) 7.5 MG tablet Take 1 tablet (7.5 mg total) by mouth 2 (two) times daily. 09/26/24   Bevely Doffing, FNP  carvedilol  (COREG ) 12.5 MG tablet Take 1 tablet (12.5 mg total) by mouth 2 (two) times daily with a meal. 09/26/24   Huenink, Doffing, FNP  cloNIDine  (CATAPRES ) 0.1 MG tablet Take 1 tablet (0.1 mg total) by mouth 2 (two) times daily. For BP 09/26/24   Huenink, Doffing, FNP  gabapentin  (NEURONTIN ) 300 MG capsule Take 1 capsule (300 mg total) by mouth at bedtime. 09/26/24   Bevely Doffing, FNP  hydrALAZINE  (APRESOLINE ) 100 MG tablet Take 1 tablet (100 mg total) by mouth 3 (three) times daily. 09/26/24   Bevely Doffing, FNP  hydrOXYzine  (ATARAX ) 25 MG tablet Take 1 tablet (25 mg total) by mouth every 12 (twelve) hours as needed for anxiety or itching. 09/26/24   Bevely Doffing, FNP  levETIRAcetam  (KEPPRA ) 250 MG tablet Take 1 tablet (250 mg total) by mouth See admin instructions. Take 2 tablets (500 mg total) by mouth daily AND Take extra 250mg  dose after dialysis on Tuesday, Thursday, and Saturday. 09/01/24   Pearlean Manus, MD  loperamide  (IMODIUM  A-D) 2 MG tablet Take 1 tablet (2 mg total) by mouth 3 (three) times daily as needed for  diarrhea or loose stools. 09/26/24   Bevely Doffing, FNP  losartan  (COZAAR ) 100 MG tablet Take 1 tablet (100 mg total) by mouth daily. 09/26/24   Bevely Doffing, FNP  methocarbamol  (ROBAXIN ) 500 MG tablet Take 1 tablet (500 mg total) by mouth every 6 (six) hours as needed for muscle spasms. 10/09/24   Kammerer, Megan L, DO  oxyCODONE  (OXY IR/ROXICODONE ) 5 MG immediate release tablet Take 1 tablet (5 mg total) by mouth every 8 (eight) hours as needed for severe pain (pain score 7-10). 08/26/24   Ricky Fines, MD  pantoprazole  (PROTONIX ) 40 MG tablet Take 1 tablet (40 mg  total) by mouth daily before breakfast. 09/26/24   Bevely Doffing, FNP    Allergies: Dialyvite 800 [nephro-vite]    Review of Systems  Respiratory:  Positive for shortness of breath.     Updated Vital Signs BP (!) 136/96 (BP Location: Right Arm)   Pulse 72   Temp 98.8 F (37.1 C) (Oral)   Resp 19   Ht 5' 7 (1.702 m)   Wt 78.3 kg   LMP 04/07/2020 (Within Months)   SpO2 100%   BMI 27.04 kg/m   Physical Exam Vitals and nursing note reviewed.  Constitutional:      General: She is awake. She is not in acute distress.    Appearance: Normal appearance. She is well-developed. She is ill-appearing (chronically ill-appearing). She is not toxic-appearing or diaphoretic.  HENT:     Head: Normocephalic and atraumatic.  Eyes:     General: No scleral icterus. Neck:     Vascular: No JVD.  Cardiovascular:     Rate and Rhythm: Normal rate and regular rhythm.  Pulmonary:     Effort: Pulmonary effort is normal. No tachypnea, accessory muscle usage or respiratory distress.     Breath sounds: No stridor. Examination of the right-upper field reveals rales. Examination of the left-upper field reveals rales. Examination of the right-middle field reveals rales. Examination of the left-middle field reveals rales. Examination of the right-lower field reveals rales. Examination of the left-lower field reveals rales. Rales present. No decreased breath sounds, wheezing or rhonchi.  Chest:     Chest wall: No tenderness.  Abdominal:     General: There is distension.     Palpations: Abdomen is soft.     Tenderness: There is abdominal tenderness (Generalized tenderness). There is no right CVA tenderness, left CVA tenderness, guarding or rebound.     Comments: Distended  Musculoskeletal:        General: Normal range of motion.     Right lower leg: No edema.     Left lower leg: No edema.     Comments: Grossly normal ROM of all 4 extremities  Skin:    General: Skin is warm.     Capillary Refill:  Capillary refill takes less than 2 seconds.  Neurological:     General: No focal deficit present.     Mental Status: She is alert and oriented to person, place, and time.  Psychiatric:        Mood and Affect: Mood normal.        Behavior: Behavior normal. Behavior is cooperative.     (all labs ordered are listed, but only abnormal results are displayed) Labs Reviewed  CBC WITH DIFFERENTIAL/PLATELET - Abnormal; Notable for the following components:      Result Value   RBC 3.14 (*)    Hemoglobin 8.5 (*)    HCT 28.8 (*)    MCHC 29.5 (*)  RDW 19.3 (*)    nRBC 0.4 (*)    All other components within normal limits  COMPREHENSIVE METABOLIC PANEL WITH GFR - Abnormal; Notable for the following components:   Chloride 95 (*)    BUN 56 (*)    Creatinine, Ser 9.84 (*)    Calcium  7.4 (*)    Alkaline Phosphatase 372 (*)    GFR, Estimated 5 (*)    Anion gap 20 (*)    All other components within normal limits  D-DIMER, QUANTITATIVE - Abnormal; Notable for the following components:   D-Dimer, Quant 11.76 (*)    All other components within normal limits  PRO BRAIN NATRIURETIC PEPTIDE - Abnormal; Notable for the following components:   Pro Brain Natriuretic Peptide >35,000.0 (*)    All other components within normal limits  TROPONIN T, HIGH SENSITIVITY - Abnormal; Notable for the following components:   Troponin T High Sensitivity 626 (*)    All other components within normal limits  TROPONIN T, HIGH SENSITIVITY - Abnormal; Notable for the following components:   Troponin T High Sensitivity 567 (*)    All other components within normal limits  RESP PANEL BY RT-PCR (RSV, FLU A&B, COVID)  RVPGX2  C DIFFICILE QUICK SCREEN W PCR REFLEX    C DIFFICILE QUICK SCREEN W PCR REFLEX    GASTROINTESTINAL PANEL BY PCR, STOOL (REPLACES STOOL CULTURE)  HCG, SERUM, QUALITATIVE  HEPATITIS B SURFACE ANTIGEN  HEPATITIS B SURFACE ANTIBODY, QUANTITATIVE  HEMOGLOBIN AND HEMATOCRIT, BLOOD  RENAL FUNCTION  PANEL    EKG: EKG Interpretation Date/Time:  Sunday November 06 2024 16:59:23 EST Ventricular Rate:  82 PR Interval:  156 QRS Duration:  157 QT Interval:  435 QTC Calculation: 509 R Axis:   30  Text Interpretation: Sinus rhythm Probable left atrial enlargement Left ventricular hypertrophy Anterior Q waves, possibly due to LVH Abnormal T, consider ischemia, lateral leads Confirmed by Melvenia Motto [phone]) on 11/06/2024 5:25:57 PM  Radiology: NM Pulmonary Perfusion Result Date: 11/06/2024 EXAM: NM Lung Perfusion Scan. CLINICAL HISTORY: chest pain, shortness of breath TECHNIQUE: Radiolabeled MAA was administered intravenously and planar images of the lungs were obtained in multiple projections. RADIOPHARMACEUTICAL: 4.4 mCi Technetium-63m albumin  aggregated (MAA). COMPARISON: Chest CT today. FINDINGS: PERFUSION: No perfusion defects to suggest pulmonary embolus. IMPRESSION: 1. No perfusion defects to indicate pulmonary emboli. Electronically signed by: Franky Crease MD 11/06/2024 08:40 PM EST RP Workstation: [id number]   CT CHEST ABDOMEN PELVIS WO CONTRAST Result Date: 11/06/2024 EXAM: CT CHEST, ABDOMEN AND PELVIS WITHOUT CONTRAST 11/06/2024 05:53:02 PM TECHNIQUE: CT of the chest, abdomen and pelvis was performed without the administration of intravenous contrast. Multiplanar reformatted images are provided for review. Automated exposure control, iterative reconstruction, and/or weight based adjustment of the mA/kV was utilized to reduce the radiation dose to as low as reasonably achievable. COMPARISON: Abdominal CT 10/09/2024. Chest CT 08/28/2024. CLINICAL HISTORY: Short of breath, distended abdomen, generalized abdominal pain, nausea, diarrhea. FINDINGS: CHEST: MEDIASTINUM AND LYMPH NODES: Cardiomegaly. Scattered coronary artery and aortic atherosclerosis. The central airways are clear. No mediastinal, hilar or axillary lymphadenopathy. LUNGS AND PLEURA: Small bilateral pleural effusions, left greater  than right, increasing since prior study. Compressive atelectasis in the lingula and left lower lobe. Chronic opacity peripherally in the right lung base, likely scarring. No pneumothorax. ABDOMEN AND PELVIS: LIVER: The liver is unremarkable. GALLBLADDER AND BILE DUCTS: Gallbladder is unremarkable. No biliary ductal dilatation. SPLEEN: No acute abnormality. PANCREAS: No acute abnormality. ADRENAL GLANDS: No acute abnormality. KIDNEYS, URETERS AND BLADDER: Kidneys  are atrophic. No stones in the kidneys or ureters. No hydronephrosis. No perinephric or periureteral stranding. Urinary bladder is unremarkable. GI AND BOWEL: Stomach demonstrates no acute abnormality. There is no bowel obstruction. REPRODUCTIVE ORGANS: No acute abnormality. PERITONEUM AND RETROPERITONEUM: Large volume ascites, increasing since prior study. No free air. VASCULATURE: Aorta is normal in caliber. Aortic atherosclerosis. ABDOMINAL AND PELVIS LYMPH NODES: No lymphadenopathy. REPRODUCTIVE ORGANS: No acute abnormality. BONES AND SOFT TISSUES: No acute osseous abnormality. No focal soft tissue abnormality. IMPRESSION: 1. Small bilateral pleural effusions, left greater than right, increased from prior study. 2. Large volume ascites, increased from prior study. 3. Coronary artery disease, aortic atherosclerosis. Electronically signed by: Franky Crease MD 11/06/2024 06:16 PM EST RP Workstation: [id number]     .Critical Care  Performed by: Janetta Terrall FALCON, PA-C Authorized by: Janetta Terrall FALCON, PA-C   Critical care provider statement:    Critical care time (minutes):  30   Critical care was necessary to treat or prevent imminent or life-threatening deterioration of the following conditions: Hypertensive crisis with acute on chronic endorgan damage.   Critical care was time spent personally by me on the following activities:  Development of treatment plan with patient or surrogate, discussions with consultants, evaluation of patient's  response to treatment, examination of patient, ordering and review of laboratory studies, ordering and review of radiographic studies, ordering and performing treatments and interventions, pulse oximetry, re-evaluation of patient's condition, review of old charts, blood draw for specimens, interpretation of cardiac output measurements and obtaining history from patient or surrogate   Care discussed with: admitting provider      Medications Ordered in the ED  Chlorhexidine  Gluconate Cloth 2 % PADS 6 each (has no administration in time range)  albuterol  (PROVENTIL ) (2.5 MG/3ML) 0.083% nebulizer solution 2.5 mg (has no administration in time range)  methocarbamol  (ROBAXIN ) tablet 500 mg (has no administration in time range)  levETIRAcetam  (KEPPRA ) tablet 250 mg (has no administration in time range)  aspirin  EC tablet 81 mg (has no administration in time range)  oxyCODONE  (Oxy IR/ROXICODONE ) immediate release tablet 5 mg (has no administration in time range)  amLODipine  (NORVASC ) tablet 10 mg (has no administration in time range)  atorvastatin  (LIPITOR) tablet 40 mg (has no administration in time range)  carvedilol  (COREG ) tablet 12.5 mg (has no administration in time range)  cloNIDine  (CATAPRES ) tablet 0.1 mg (has no administration in time range)  hydrALAZINE  (APRESOLINE ) tablet 100 mg (has no administration in time range)  losartan  (COZAAR ) tablet 100 mg (has no administration in time range)  busPIRone  (BUSPAR ) tablet 7.5 mg (has no administration in time range)  hydrOXYzine  (ATARAX ) tablet 25 mg (has no administration in time range)  pantoprazole  (PROTONIX ) EC tablet 40 mg (has no administration in time range)  gabapentin  (NEURONTIN ) capsule 300 mg (has no administration in time range)  sodium chloride  flush (NS) 0.9 % injection 3 mL (has no administration in time range)  sodium chloride  flush (NS) 0.9 % injection 3 mL (has no administration in time range)  sodium chloride  flush (NS) 0.9 %  injection 3 mL (has no administration in time range)  0.9 %  sodium chloride  infusion (has no administration in time range)  acetaminophen  (TYLENOL ) tablet 650 mg (has no administration in time range)    Or  acetaminophen  (TYLENOL ) suppository 650 mg (has no administration in time range)  traZODone  (DESYREL ) tablet 50 mg (has no administration in time range)  polyethylene glycol (MIRALAX  / GLYCOLAX ) packet 17 g (has no administration in  time range)  bisacodyl  (DULCOLAX) suppository 10 mg (has no administration in time range)  heparin  injection 5,000 Units (has no administration in time range)  labetalol  (NORMODYNE ) injection 10 mg (has no administration in time range)  cloNIDine  (CATAPRES ) tablet 0.3 mg (has no administration in time range)  nicotine  (NICODERM CQ  - dosed in mg/24 hours) patch 21 mg (has no administration in time range)  lactobacillus acidophilus (BACID) tablet 2 tablet (has no administration in time range)  amLODipine  (NORVASC ) tablet 10 mg (10 mg Oral Given 11/06/24 1757)  cloNIDine  (CATAPRES ) tablet 0.1 mg (0.1 mg Oral Given 11/06/24 1757)  hydrALAZINE  (APRESOLINE ) tablet 100 mg (100 mg Oral Given 11/06/24 1757)  losartan  (COZAAR ) tablet 100 mg (100 mg Oral Given 11/06/24 1757)  HYDROmorphone  (DILAUDID ) injection 0.5 mg (0.5 mg Intravenous Given 11/06/24 1800)  metoCLOPramide  (REGLAN ) injection 5 mg (5 mg Intravenous Given 11/06/24 1800)  technetium albumin  aggregated (MAA) injection solution 4 millicurie (4.4 millicuries Intravenous Contrast Given 11/06/24 2010)    Clinical Course as of 11/06/24 2056  Sun Nov 06, 2024  1845 Spoke with Dr. Melia, will dialyze patient first shift tomorrow once admitted [CH]    Clinical Course User Index [CH] Joah Patlan, Terrall FALCON, PA-C                                 Medical Decision Making Amount and/or Complexity of Data Reviewed Labs: ordered. Radiology: ordered.  Risk Prescription drug management. Decision regarding  hospitalization.   Patient presents to the ED for concern of shortness of breath, possible fluid overload, entire body pain, this involves an extensive number of treatment options, and is a complaint that carries with it a high risk of complications and morbidity.  The differential diagnosis includes viral syndrome, fluid overload, pleural effusion, pneumothorax, pneumonia, pulmonary edema, x-ray abnormality, asthma exacerbation, etc.   Co morbidities that complicate the patient evaluation   asthma, lupus, malignant hypertension, tobacco use, ESRD on hemodialysis, heart failure with reduced ejection fraction, sepsis, anemia, history of pulmonary embolus, prolonged QT interval   Additional history obtained:  Chart briefly reviewed where patient has recently been seen for abdominal pain, sepsis, as well as ascites   Lab Tests:  I Ordered, and personally interpreted labs.  The pertinent results include: CBC remarkable for hemoglobin of 8.5 which seems improved compared to previous labs, creatinine 9.84, potassium 5.1, BUN 56, calcium  7.4, alk phos 372, GFR 5, anion gap 20, D-dimer elevated at 11.76, pregnancy negative, troponins 626 and 567, proBNP greater than 35,000, respiratory panel negative   Imaging Studies ordered:  I ordered imaging studies including CT chest abdomen pelvis without contrast, VQ scan I independently visualized and interpreted imaging which showed: Small bilateral pleural effusions, left greater than right, increased in size from previous study, large volume ascites, coronary artery disease *VQ scan pending at time of admission* I agree with the radiologist interpretation   Cardiac Monitoring:  The patient was maintained on a cardiac monitor.  I personally viewed and interpreted the cardiac monitored which showed an underlying rhythm of: Sinus rhythm   Medicines ordered and prescription drug management:  I ordered medication including patient's home medications  for pretension, Dilaudid  for pain, Reglan  for nausea Reevaluation of the patient after these medicines showed that the patient stayed the same I have reviewed the patients home medicines and have made adjustments as needed   Test Considered:  None   Critical Interventions:  Hypertensive crisis  with suspected end-organ damage   Consultations Obtained:  I requested consultation with the hospitalist,  and discussed lab and imaging findings as well as pertinent plan - they recommend: Patient the hospital for ongoing diagnosis and treatment   Problem List / ED Course:  43 year old female, vital signs significant for blood pressure of 210/120, presents to the emergency department with chief complaint of shortness of breath, patient has not been compliant with her outpatient dialysis On physical exam patient does have some Rales with auscultation of her lungs, patient also has a distended abdomen which is tender in a generalized manner with palpation Patient has significant comorbidities including past medical history significant for pulmonary embolism, heart failure, ESRD on dialysis, etc., due to this we will obtain lab work as well as imaging of chest, abdomen, as well as pelvis Broad differential includes fluid overload, pneumonia, viral syndrome, pulmonary embolism, heart failure exacerbation, decompensated liver failure, etc. Will treat symptomatically with antiemetic and pain medication, will also give patient home dose of blood pressure medications due to significant hypertension Lab work significant for fluid overload as well as elevated D-dimer, this appears chronically elevated per patient chart however due to acute shortness of breath with history of PE cannot take pulmonary embolism off the differential, VQ scan ordered and pending at time of admission, scheduled for 8 PM today, lab workup also notable for elevated troponins at 626 and 567 however flat, EKG reassuring with no acute  ischemic changes or ST segment elevation, suspect demand ischemia leading to elevated troponin along with fluid overload Spoke with Dr. Melia with the nephrology service who will put patient on first of dialysis tomorrow Spoke with Dr. Pearlean who agrees with admission for the patient for dialysis, paracentesis, as well as VQ scan and continued monitoring Patient admitted Most likely diagnosis at this time is fluid overload due to decompensated kidney disease, liver disease, heart failure, possible pulmonary embolism, pleural effusions notable on imaging today, elevated troponin but suspicious that this may be due to fluid overload and demand ischemia, reassuring that second troponin decreased   Reevaluation:  After the interventions noted above, I reevaluated the patient and found that they have :stayed the same   Social Determinants of Health:  None   Dispostion:  After consideration of the diagnostic results and the patients response to treatment, I feel that the patient would benefit from patient to the hospital for ongoing diagnosis and treatment.     Final diagnoses:  Body aches  Generalized abdominal pain  Abdominal distension  Other ascites  Chest pain, unspecified type  Dyspnea, unspecified type  Elevated troponin  Pleural effusion  ESRD on dialysis (HCC)  Hypertension, unspecified type  Acute on chronic congestive heart failure, unspecified heart failure type [facility])    ED Discharge Orders     None          Janetta Terrall JULIANNA DEVONNA 11/06/24 2057    Melvenia Motto, MD 11/06/24 2244

## 2024-11-06 NOTE — Assessment & Plan Note (Signed)
 Smokes cessation advised, nicotine  patch ordered

## 2024-11-06 NOTE — Assessment & Plan Note (Signed)
-  history of pulmonary embolism (not on chronic anticoagulation due to chronic anemia and chronic thrombocytopenia)--await VQ scan to make decision on anticoagulation -D-dimer and dyspnea noted -If VQ scan positive will initiate IV heparin  drip

## 2024-11-06 NOTE — Assessment & Plan Note (Signed)
-   Continue amlodipine , clonidine  and losartan  as well as hydralazine  and Coreg  -May use IV labetalol  as needed if BP remains persistently elevated despite above agents

## 2024-11-06 NOTE — Assessment & Plan Note (Signed)
 Hgb currently 8.5 which is similar to patient's usual baseline -Defer decision on EPO/ESA agent to nephrology team

## 2024-11-06 NOTE — Assessment & Plan Note (Signed)
--   Plans for palliative paracentesis on 11/07/2024 by IR

## 2024-11-07 ENCOUNTER — Other Ambulatory Visit: Payer: Self-pay

## 2024-11-07 ENCOUNTER — Encounter (HOSPITAL_COMMUNITY): Payer: Self-pay | Admitting: Family Medicine

## 2024-11-07 ENCOUNTER — Inpatient Hospital Stay (HOSPITAL_COMMUNITY)

## 2024-11-07 LAB — RENAL FUNCTION PANEL
Albumin: 3.2 g/dL — ABNORMAL LOW (ref 3.5–5.0)
Anion gap: 17 — ABNORMAL HIGH (ref 5–15)
BUN: 66 mg/dL — ABNORMAL HIGH (ref 6–20)
CO2: 26 mmol/L (ref 22–32)
Calcium: 7 mg/dL — ABNORMAL LOW (ref 8.9–10.3)
Chloride: 98 mmol/L (ref 98–111)
Creatinine, Ser: 10.7 mg/dL — ABNORMAL HIGH (ref 0.44–1.00)
GFR, Estimated: 4 mL/min — ABNORMAL LOW (ref >60)
Glucose, Bld: 85 mg/dL (ref 70–99)
Phosphorus: 9.1 mg/dL — ABNORMAL HIGH (ref 2.5–4.6)
Potassium: 5.3 mmol/L — ABNORMAL HIGH (ref 3.5–5.1)
Sodium: 141 mmol/L (ref 135–145)

## 2024-11-07 LAB — HEMOGLOBIN AND HEMATOCRIT, BLOOD
HCT: 25.3 % — ABNORMAL LOW (ref 36.0–46.0)
Hemoglobin: 7.5 g/dL — ABNORMAL LOW (ref 12.0–15.0)

## 2024-11-07 LAB — HEPATITIS B SURFACE ANTIGEN: Hepatitis B Surface Ag: NONREACTIVE

## 2024-11-07 MED ORDER — DIPHENHYDRAMINE HCL 50 MG/ML IJ SOLN
25.0000 mg | Freq: Once | INTRAMUSCULAR | Status: AC
  Administered 2024-11-07: 25 mg via INTRAVENOUS

## 2024-11-07 MED ORDER — HEPARIN SODIUM (PORCINE) 1000 UNIT/ML DIALYSIS: 2000.0000 [IU] | Freq: Once | INTRAMUSCULAR | Status: DC

## 2024-11-07 MED ORDER — PENTAFLUOROPROP-TETRAFLUOROETH EX AERO: 1.0000 | INHALATION_SPRAY | CUTANEOUS | Status: DC | PRN

## 2024-11-07 MED ORDER — LIDOCAINE HCL (PF) 2 % IJ SOLN
10.0000 mL | Freq: Once | INTRAMUSCULAR | Status: AC
  Administered 2024-11-07: 10 mL
  Filled 2024-11-07: qty 10

## 2024-11-07 MED ORDER — LIDOCAINE-PRILOCAINE 2.5-2.5 % EX CREA: 1.0000 | TOPICAL_CREAM | CUTANEOUS | Status: DC | PRN

## 2024-11-07 MED ORDER — CALCITRIOL 0.25 MCG PO CAPS
0.2500 ug | ORAL_CAPSULE | ORAL | Status: DC
  Administered 2024-11-07 – 2024-11-09 (×2): 0.25 ug via ORAL
  Filled 2024-11-07: qty 1

## 2024-11-07 MED ORDER — HEPARIN SODIUM (PORCINE) 1000 UNIT/ML DIALYSIS: 1000.0000 [IU] | INTRAMUSCULAR | Status: DC | PRN

## 2024-11-07 MED ORDER — LIDOCAINE HCL (PF) 1 % IJ SOLN: 5.0000 mL | INTRAMUSCULAR | Status: DC | PRN

## 2024-11-07 MED ORDER — LIDOCAINE HCL (PF) 2 % IJ SOLN
INTRAMUSCULAR | Status: AC
  Filled 2024-11-07: qty 10

## 2024-11-07 MED ORDER — ALTEPLASE 2 MG IJ SOLR: 2.0000 mg | Freq: Once | INTRAMUSCULAR | Status: DC | PRN

## 2024-11-07 MED ORDER — ANTICOAGULANT SODIUM CITRATE 4% (200MG/5ML) IV SOLN: 5.0000 mL | Status: DC | PRN

## 2024-11-07 MED ORDER — ALBUMIN HUMAN 25 % IV SOLN
50.0000 g | Freq: Once | INTRAVENOUS | Status: AC
  Administered 2024-11-07: 50 g via INTRAVENOUS
  Filled 2024-11-07: qty 200

## 2024-11-07 MED ORDER — DARBEPOETIN ALFA 100 MCG/0.5ML IJ SOSY
100.0000 ug | PREFILLED_SYRINGE | INTRAMUSCULAR | Status: DC
  Administered 2024-11-07: 100 ug via SUBCUTANEOUS
  Filled 2024-11-07: qty 0.5

## 2024-11-07 MED ORDER — DIPHENHYDRAMINE HCL 50 MG/ML IJ SOLN
INTRAMUSCULAR | Status: AC
  Filled 2024-11-07: qty 1

## 2024-11-07 NOTE — Plan of Care (Signed)

## 2024-11-07 NOTE — Assessment & Plan Note (Signed)
-   Continue current antihypertensive agents and volume management with dialysis. - Follow vital signs. - Continue heart healthy/low-sodium diet.

## 2024-11-07 NOTE — Procedures (Signed)
 Ultrasound-guided therapeutic paracentesis performed yielding 8 liters of straw colored colored fluid.  No immediate complications. EBL is none.

## 2024-11-07 NOTE — Progress Notes (Signed)
 Patient arrived to this unit and has been admitted to room 309. Pt is alert, verbal oriented x4. When this clinical research associate and another RN attempted to perform pts total skin assessment for her admission assessment, pt did not want to remove her zip up performance food group that she was wearing as patient c/o being very cold. Pt did allow this writer to assess her L arm AV fistula site and her chest as this writer had to place pt on telemetry. Very limited skin assessment was able to be performed due to pt not wanting to remove her pjs, however, pt denied having any type of wounds to her skin, denied having any type of break in her skin that would cause her skin not to be intact as well as denying any bruising or scars. This clinical research associate will reattempt to perform a total skin assessment before this writers shift is complete.

## 2024-11-07 NOTE — Assessment & Plan Note (Signed)
-  VQ scan negative for acute PE - Continue volume management and stabilization - Wean off oxygen  supplementation as tolerated.

## 2024-11-07 NOTE — Progress Notes (Signed)
 Pt receives out-pt HD at [facility], TTS 1015am chair time. Will continue to assist as needed  [facility] Dialysis Navigator [phone] [place] #- [phone]

## 2024-11-07 NOTE — Progress Notes (Signed)
 PROGRESS NOTE    Cathy [name]  FMW:[id number] DOB: 03-20-1981 DOA: 11/06/2024 PCP: Terry Wilhelmena Lloyd Hilario, FNP (  Chief Complaint  Patient presents with   Shortness of Breath   Nausea    Brief Narrative: As per H&P written by Dr. Pearlean on 11/06/24 Cathy [name] is a 43 y.o. 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 - COVID flu and RSV negative in the ED - proBNP elevated, D-dimer chronically elevated - Initial troponin 626, repeat 567--EKG sinus rhythm with LVH Hgb 8.5 , WBC and Platelets are WNL K=5.1 Na 140, Creatinine 9.84 - CT chest, abdomen and pelvis---IMPRESSION: 1. Small bilateral pleural effusions, left greater than right, increased from prior study. 2. Large volume ascites, increased from prior study.  Assessment & Plan: Assessment & Plan End-stage renal disease on hemodialysis (HCC) -plan for HD today and then again on tomorrow to get patient back on on regular schedule. -Follow clinical response and further recommendations by nephrology service. Benign essential HTN - Continue current antihypertensive management. Chronic systolic CHF (congestive heart failure) (HCC) Volume overload - Heart healthy/low-sodium diet discussed with patient - Continue to follow daily weights - Continue  volume management with dialysis. History of pulmonary embolus -VQ scan negative for acute PE - Continue volume management and stabilization - Wean off oxygen  supplementation as tolerated. 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 Anemia due to chronic kidney disease - IV iron  and Epogen  as per nephrology discretion. Malignant hypertension - Continue current antihypertensive agents and volume management with dialysis. - Follow vital signs. - Continue heart healthy/low-sodium diet. Tense ascites - IR consulted for paracentesis - 8 L fluid removal provided - IV albumin  will be given. Tobacco use -Cessation counseling provided. - Continue nicotine  patch.  DVT prophylaxis: Heparin . Code Status: Full code. Family Communication: No family at bedside. Disposition:   Status is: Inpatient Remains inpatient appropriate because: Continue further management for fluid overload.  Wean off oxygen  supplementation as tolerated.   Consultants:  Nephrology service/IR  Procedures:  -See below for x-ray reports. -Status post paracentesis (11/07/2024).   Antimicrobials:  None   Subjective: Chronically ill in appearance; no using accessory muscles.  Good saturation on 2 L supplementation.  Patient reports no nausea or vomiting.  Continue to experience intermittent loose stools.   Objective: Vitals:   11/07/24 1500 11/07/24 1536 11/07/24 1537 11/07/24 1541  BP: 138/75   (!) 140/90  Pulse: 77   78  Resp: 20   20  Temp:   98 F (36.7 C)   TempSrc:   Oral   SpO2: 100%   100%  Weight:  71.7 kg    Height:        Intake/Output Summary (Last 24 hours) at 11/07/2024  1551 Last data filed at 11/07/2024 1541 Gross per 24 hour   Intake 63 ml  Output 2300 ml  Net -2237 ml   Filed Weights   11/06/24 1953 11/07/24 1238 11/07/24 1536  Weight: 78.3 kg 74 kg 71.7 kg    Examination:  General exam: 2 L nasal cannula supplementation in place; no fever, reports increased abdominal distention due to ascites.  No chest pain. Respiratory system: Good saturation on current supplementation; decreased breath sounds at the bases.  No using accessory muscles. Cardiovascular system: S1 & S2 heard, rate controlled; no rubs, no gallops. Gastrointestinal system: Abdomen is distended, mildly tender to palpation with positive ascites.  No frank guarding.  Positive bowel sounds. Central nervous system: Moving 4 limbs spontaneously.  No focal neurological deficits. Extremities: No cyanosis or clubbing. Skin: No petechiae. Psychiatry: Judgement and insight appear normal. Mood & affect appropriate.   Data Reviewed: I have personally reviewed following labs and imaging studies  CBC: Recent Labs  Lab 11/06/24 1554 11/07/24 0422  WBC 5.5  --   NEUTROABS 4.1  --   HGB 8.5* 7.5*  HCT 28.8* 25.3*  MCV 91.7  --   PLT 279  --     Basic Metabolic Panel: Recent Labs  Lab 11/06/24 1554 11/07/24 0422  NA 140 141  K 5.1 5.3*  CL 95* 98  CO2 25 26  GLUCOSE 77 85  BUN 56* 66*  CREATININE 9.84* 10.70*  CALCIUM  7.4* 7.0*  PHOS  --  9.1*    GFR: Estimated Creatinine Clearance: 7 mL/min (A) (by C-G formula based on SCr of 10.7 mg/dL (H)).  Liver Function Tests: Recent Labs  Lab 11/06/24 1554 11/07/24 0422  AST 21  --   ALT 8  --   ALKPHOS 372*  --   BILITOT 0.5  --   PROT 7.3  --   ALBUMIN  3.8 3.2*    CBG: No results for input(s): GLUCAP in the last 168 hours.   Recent Results (from the past 240 hours)  Resp panel by RT-PCR (RSV, Flu A&B, Covid) Anterior Nasal Swab     Status: None   Collection Time: 11/06/24  4:53 PM   Specimen: Anterior Nasal Swab  Result Value Ref Range Status   SARS Coronavirus 2 by RT  PCR NEGATIVE NEGATIVE Final    Comment: (NOTE) SARS-CoV-2 target nucleic acids are NOT DETECTED.  The SARS-CoV-2 RNA is generally detectable in upper respiratory specimens during the acute phase of infection. The lowest concentration of SARS-CoV-2 viral copies this assay can detect is 138 copies/mL. A negative result does not preclude SARS-Cov-2 infection and should not be used as the sole basis for treatment or other patient management decisions. A negative result may occur with  improper specimen collection/handling, submission of specimen other than nasopharyngeal swab, presence of viral mutation(s) within the areas targeted by this assay, and inadequate number of viral copies(<138 copies/mL). A negative result must be combined with clinical observations, patient history, and epidemiological information. The expected result is Negative.  Fact Sheet for Patients:  bloggercourse.com  Fact Sheet for Healthcare Providers:  seriousbroker.it  This test is no t yet approved or cleared by the [place]  FDA and  has been authorized for detection and/or diagnosis of SARS-CoV-2 by FDA under an Emergency Use Authorization (EUA). This EUA will remain  in effect (meaning this test can be used) for the duration of the COVID-19 declaration under Section 564(b)(1) of the Act, 21 U.S.C.section 360bbb-3(b)(1), unless the authorization is  terminated  or revoked sooner.       Influenza A by PCR NEGATIVE NEGATIVE Final   Influenza B by PCR NEGATIVE NEGATIVE Final    Comment: (NOTE) The Xpert Xpress SARS-CoV-2/FLU/RSV plus assay is intended as an aid in the diagnosis of influenza from Nasopharyngeal swab specimens and should not be used as a sole basis for treatment. Nasal washings and aspirates are unacceptable for Xpert Xpress SARS-CoV-2/FLU/RSV testing.  Fact Sheet for Patients: bloggercourse.com  Fact Sheet for  Healthcare Providers: seriousbroker.it  This test is not yet approved or cleared by the [place]  FDA and has been authorized for detection and/or diagnosis of SARS-CoV-2 by FDA under an Emergency Use Authorization (EUA). This EUA will remain in effect (meaning this test can be used) for the duration of the COVID-19 declaration under Section 564(b)(1) of the Act, 21 U.S.C. section 360bbb-3(b)(1), unless the authorization is terminated or revoked.     Resp Syncytial Virus by PCR NEGATIVE NEGATIVE Final    Comment: (NOTE) Fact Sheet for Patients: bloggercourse.com  Fact Sheet for Healthcare Providers: seriousbroker.it  This test is not yet approved or cleared by the [place]  FDA and has been authorized for detection and/or diagnosis of SARS-CoV-2 by FDA under an Emergency Use Authorization (EUA). This EUA will remain in effect (meaning this test can be used) for the duration of the COVID-19 declaration under Section 564(b)(1) of the Act, 21 U.S.C. section 360bbb-3(b)(1), unless the authorization is terminated or revoked.  Performed at [facility], [address], [place], [place] [address]     Radiology Studies: US  Paracentesis Result Date: 11/07/2024 INDICATION: 43 year old female. History of end-stage renal disease with recurrent ascites. Request is for therapeutic paracentesis. EXAM: ULTRASOUND GUIDED THERAPEUTIC PARACENTESIS MEDICATIONS: Lidocaine  1% 10 mL COMPLICATIONS: None immediate. PROCEDURE: Informed written consent was obtained from the patient after a discussion of the risks, benefits and alternatives to treatment. A timeout was performed prior to the initiation of the procedure. Initial ultrasound scanning demonstrates a large amount of ascites within the right lower abdominal quadrant. The right lower abdomen was prepped and draped in the usual sterile fashion. 1% lidocaine  was used for  local anesthesia. Following this, a 19 gauge, 7-cm, Yueh catheter was introduced. An ultrasound image was saved for documentation purposes. The paracentesis was performed. The catheter was removed and a dressing was applied. The patient tolerated the procedure well without immediate post procedural complication. Patient received post-procedure intravenous albumin ; see nursing notes for details. FINDINGS: A total of approximately 8 L of straw-colored fluid was removed. IMPRESSION: Successful ultrasound-guided therapeutic paracentesis yielding 8 liters of peritoneal straw-colored fluid. Performed by Delon Beagle NP Electronically Signed   By: Ester Sides M.D.   On: 11/07/2024 12:31   NM Pulmonary Perfusion Result Date: 11/06/2024 EXAM: NM Lung Perfusion Scan. CLINICAL HISTORY: chest pain, shortness of breath TECHNIQUE: Radiolabeled MAA was administered intravenously and planar images of the lungs were obtained in multiple projections. RADIOPHARMACEUTICAL: 4.4 mCi Technetium-76m albumin  aggregated (MAA). COMPARISON: Chest CT today. FINDINGS: PERFUSION: No perfusion defects to suggest pulmonary embolus. IMPRESSION: 1. No perfusion defects to indicate pulmonary emboli. Electronically signed by: Franky Crease MD 11/06/2024 08:40 PM EST RP Workstation: [id number]   CT CHEST ABDOMEN PELVIS WO CONTRAST Result Date: 11/06/2024 EXAM: CT CHEST, ABDOMEN AND PELVIS WITHOUT CONTRAST 11/06/2024 05:53:02 PM TECHNIQUE: CT of the chest, abdomen and pelvis was performed without the administration of intravenous contrast. Multiplanar reformatted images are provided for review. Automated exposure control, iterative reconstruction, and/or weight based adjustment  of the mA/kV was utilized to reduce the radiation dose to as low as reasonably achievable. COMPARISON: Abdominal CT 10/09/2024. Chest CT 08/28/2024. CLINICAL HISTORY: Short of breath, distended abdomen, generalized abdominal pain, nausea, diarrhea. FINDINGS: CHEST:  MEDIASTINUM AND LYMPH NODES: Cardiomegaly. Scattered coronary artery and aortic atherosclerosis. The central airways are clear. No mediastinal, hilar or axillary lymphadenopathy. LUNGS AND PLEURA: Small bilateral pleural effusions, left greater than right, increasing since prior study. Compressive atelectasis in the lingula and left lower lobe. Chronic opacity peripherally in the right lung base, likely scarring. No pneumothorax. ABDOMEN AND PELVIS: LIVER: The liver is unremarkable. GALLBLADDER AND BILE DUCTS: Gallbladder is unremarkable. No biliary ductal dilatation. SPLEEN: No acute abnormality. PANCREAS: No acute abnormality. ADRENAL GLANDS: No acute abnormality. KIDNEYS, URETERS AND BLADDER: Kidneys are atrophic. No stones in the kidneys or ureters. No hydronephrosis. No perinephric or periureteral stranding. Urinary bladder is unremarkable. GI AND BOWEL: Stomach demonstrates no acute abnormality. There is no bowel obstruction. REPRODUCTIVE ORGANS: No acute abnormality. PERITONEUM AND RETROPERITONEUM: Large volume ascites, increasing since prior study. No free air. VASCULATURE: Aorta is normal in caliber. Aortic atherosclerosis. ABDOMINAL AND PELVIS LYMPH NODES: No lymphadenopathy. REPRODUCTIVE ORGANS: No acute abnormality. BONES AND SOFT TISSUES: No acute osseous abnormality. No focal soft tissue abnormality. IMPRESSION: 1. Small bilateral pleural effusions, left greater than right, increased from prior study. 2. Large volume ascites, increased from prior study. 3. Coronary artery disease, aortic atherosclerosis. Electronically signed by: Franky Crease MD 11/06/2024 06:16 PM EST RP Workstation: [id number]    Scheduled Meds:  acidophilus  1 capsule Oral TID   amLODipine   10 mg Oral q AM   aspirin  EC  81 mg Oral Daily   atorvastatin   40 mg Oral Daily   busPIRone   7.5 mg Oral BID   calcitRIOL   0.25 mcg Oral Q M,W,F-HD   carvedilol   12.5 mg Oral BID WC   Chlorhexidine  Gluconate Cloth  6 each Topical Q0600    cloNIDine   0.1 mg Oral BID   darbepoetin (ARANESP ) injection - DIALYSIS  100 mcg Subcutaneous Q Mon-1800   gabapentin   300 mg Oral QHS   heparin   5,000 Units Subcutaneous Q8H   hydrALAZINE   100 mg Oral TID   levETIRAcetam   500 mg Oral Daily   And   [START ON 11/08/2024] levETIRAcetam   250 mg Oral Q T,Th,Sa-HD   losartan   100 mg Oral Daily   nicotine   21 mg Transdermal Daily   pantoprazole   40 mg Oral QAC breakfast   sodium chloride  flush  3 mL Intravenous Q12H   sodium chloride  flush  3 mL Intravenous Q12H   Continuous Infusions:  sodium chloride      albumin  human       LOS: 1 day    Time spent: 50 minutes    Eric Nunnery, MD [facility]   To contact the attending provider between 7A-7P or the covering provider during after hours 7P-7A, please log into the web site [email or web address] and access using universal [place] password for that web site. If you do not have the password, please call the hospital operator.  11/07/2024, 3:51 PM

## 2024-11-07 NOTE — Assessment & Plan Note (Signed)
-   IV iron  and Epogen  as per nephrology discretion.

## 2024-11-07 NOTE — Consult Note (Signed)
 ESRD Consult Note  Requesting provider:  ER Reason for consult: ESRD, provision of dialysis  Assessment/Recommendations:  ESRD  -outpatient HD orders: [facility]  TTS.  4 hours.  EDW 67 kg.  aVF, 15-gauge.  Flow rates: 400/600.  1K/2.5 calcium .  Heparin : 500 units loading dose, 500 units hourly.  Mircera 120 mcg every 2 weeks (due today), calcitriol  0.25 mcg every treatment -HD today and then tomorrow to get her back on TTS schedule  Acute on chronic systolic CHF exacerbation Acute hypoxic respiratory failure -in the context of missed HD -UF as tolerated with HD  HTN -UF as tolerated with HD  Ascites -paracentesis planned per primary service  Anemia of Chronic Kidney Disease -Hemoglobin 7.5 -Transfuse PRN for Hgb <7  Hyperkalemia -HD planned for today and tomorrow  Diarrhea -mgmt per primary service  Secondary Hyperparathyroidism/Hyperphosphatemia - monitor phos, resume binders if on any   Recommendations were discussed with the primary team.  Ephriam Stank, MD [facility]  History of Present Illness: Cathy [name] is a/an 43 y.o. 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  Missed dialysis on 12/6.  The ER, CT CAP performed which showed small bilateral pleural effusions which is increased from prior study and large volume ascites.  Her proBNP was elevated.  COVID/flu/RSV negative. Patient seen and examined at bedside.  She does confirm that she missed dialysis on Saturday.  She reports that her breathing is a little bit better but overall still feels short of breath, she is looking  forward to getting her paracentesis done today.  No other complaints.   Medications:  Current Facility-Administered Medications  Medication Dose Route Frequency Provider Last Rate Last Admin   0.9 %  sodium chloride  infusion   Intravenous PRN Emokpae, Courage, MD       acetaminophen  (TYLENOL ) tablet 650 mg  650 mg Oral Q6H PRN Emokpae, Courage, MD       Or   acetaminophen  (TYLENOL ) suppository 650 mg  650 mg Rectal Q6H PRN Emokpae, Courage, MD       acidophilus (RISAQUAD) capsule 1 capsule  1 capsule Oral TID Emokpae, Courage, MD   1 capsule at 11/06/24 2203   albuterol  (PROVENTIL ) (2.5 MG/3ML) 0.083% nebulizer solution 2.5 mg  2.5 mg Nebulization Q2H PRN Emokpae, Courage, MD       amLODipine  (NORVASC ) tablet 10 mg  10 mg Oral q AM Emokpae, Courage, MD       aspirin  EC tablet 81 mg  81 mg Oral Daily Emokpae, Courage, MD       atorvastatin  (LIPITOR) tablet 40 mg  40 mg Oral Daily Emokpae, Courage, MD       bisacodyl  (DULCOLAX) suppository 10 mg  10 mg Rectal Daily PRN Emokpae, Courage, MD       busPIRone  (BUSPAR ) tablet 7.5 mg  7.5 mg Oral BID Emokpae, Courage, MD   7.5 mg at 11/06/24 2203   carvedilol  (COREG ) tablet 12.5 mg  12.5 mg Oral BID WC Emokpae, Courage, MD       Chlorhexidine  Gluconate Cloth 2 % PADS 6 each  6 each Topical Q0600 Melia Lynwood ORN, MD       cloNIDine  (CATAPRES ) tablet 0.1 mg  0.1 mg Oral BID Pearlean Manus, MD  gabapentin  (NEURONTIN ) capsule 300 mg  300 mg Oral QHS Emokpae, Courage, MD   300 mg at 11/06/24 2203   heparin  injection 5,000 Units  5,000 Units Subcutaneous Q8H Emokpae, Courage, MD   5,000 Units at 11/06/24 2114   hydrALAZINE  (APRESOLINE ) tablet 100 mg  100 mg Oral TID Pearlean Manus, MD       hydrOXYzine  (ATARAX ) tablet 25 mg  25 mg Oral Q12H PRN Emokpae, Courage, MD       labetalol  (NORMODYNE ) injection 10 mg  10 mg Intravenous Q4H PRN Emokpae, Courage, MD       levETIRAcetam  (KEPPRA ) tablet 500 mg  500 mg Oral Daily Emokpae, Courage, MD       And    [START ON 11/08/2024] levETIRAcetam  (KEPPRA ) tablet 250 mg  250 mg Oral Q T,Th,Sa-HD Emokpae, Courage, MD       losartan  (COZAAR ) tablet 100 mg  100 mg Oral Daily Emokpae, Courage, MD       methocarbamol  (ROBAXIN ) tablet 500 mg  500 mg Oral Q6H PRN Emokpae, Courage, MD       nicotine  (NICODERM CQ  - dosed in mg/24 hours) patch 21 mg  21 mg Transdermal Daily Emokpae, Courage, MD       oxyCODONE  (Oxy IR/ROXICODONE ) immediate release tablet 5 mg  5 mg Oral Q8H PRN Pearlean, Courage, MD   5 mg at 11/06/24 2113   pantoprazole  (PROTONIX ) EC tablet 40 mg  40 mg Oral QAC breakfast Emokpae, Courage, MD       polyethylene glycol (MIRALAX  / GLYCOLAX ) packet 17 g  17 g Oral Daily PRN Emokpae, Courage, MD       sodium chloride  flush (NS) 0.9 % injection 3 mL  3 mL Intravenous Q12H Emokpae, Courage, MD   3 mL at 11/06/24 2206   sodium chloride  flush (NS) 0.9 % injection 3 mL  3 mL Intravenous Q12H Emokpae, Courage, MD   3 mL at 11/06/24 2206   sodium chloride  flush (NS) 0.9 % injection 3 mL  3 mL Intravenous PRN Emokpae, Courage, MD       traZODone  (DESYREL ) tablet 50 mg  50 mg Oral QHS PRN Emokpae, Courage, MD         ALLERGIES Dialyvite 800 [nephro-vite]  MEDICAL HISTORY Past Medical History:  Diagnosis Date   Anasarca associated with disorder of kidney 06/08/2021   Anxiety    Asthma    Bipolar depression (HCC)    Cirrhosis (HCC)    Depression    Dyspnea    ESRD (end stage renal disease) on dialysis (HCC) 10/2020   GERD (gastroesophageal reflux disease)    Gonorrhea    Lupus    Pelvic inflammatory disease (PID)    Pleural effusion 06/08/2021   Pulmonary embolism (HCC) 01/31/2022   Renal hypertension    Renal insufficiency    Trichomonas infection      SOCIAL HISTORY Social History   Socioeconomic History   Marital status: Single    Spouse name: Not on file   Number of children: 5   Years of education: Not on file   Highest education level: Not on file  Occupational History    Occupation: unemployed  Tobacco Use   Smoking status: Some Days    Current packs/day: 0.25    Average packs/day: 0.3 packs/day for 25.9 years (6.5 ttl pk-yrs)    Types: Cigarettes    Start date: 2000    Passive exposure: Current   Smokeless tobacco: Never   Tobacco comments:    1-2  cigarettes every couple of days  Vaping Use   Vaping status: Never Used  Substance and Sexual Activity   Alcohol use: Not Currently   Drug use: Not Currently   Sexual activity: Not Currently    Birth control/protection: None, Abstinence  Other Topics Concern   Not on file  Social History Narrative   Patient states that she lives with her 5 children and her brother at a private residence. Pt stated that she is able to provide her own self care and has no home health care services. Pt does state that she goes to dialysis on Tuesday , Thursday and Saturday at [facility].    Social Drivers of Corporate Investment Banker Strain: Low Risk  (02/18/2024)   Overall Financial Resource Strain (CARDIA)    Difficulty of Paying Living Expenses: Not very hard  Food Insecurity: No Food Insecurity (11/06/2024)   Hunger Vital Sign    Worried About Running Out of Food in the Last Year: Never true    Ran Out of Food in the Last Year: Never true  Transportation Needs: No Transportation Needs (11/06/2024)   PRAPARE - Administrator, Civil Service (Medical): No    Lack of Transportation (Non-Medical): No  Physical Activity: Inactive (02/18/2024)   Exercise Vital Sign    Days of Exercise per Week: 1 day    Minutes of Exercise per Session: 0 min  Stress: Stress Concern Present (02/18/2024)   [employer] of Occupational Health - Occupational Stress Questionnaire    Feeling of Stress : To some extent  Social Connections: Unknown (11/06/2024)   Social Connection and Isolation Panel    Frequency of Communication with Friends and Family: More than three times a week    Frequency of Social Gatherings  with Friends and Family: More than three times a week    Attends Religious Services: Patient declined    Database Administrator or Organizations: Patient declined    Attends Banker Meetings: Patient declined    Marital Status: Patient declined  Intimate Partner Violence: Not At Risk (11/06/2024)   Humiliation, Afraid, Rape, and Kick questionnaire    Fear of Current or Ex-Partner: No    Emotionally Abused: No    Physically Abused: No    Sexually Abused: No     FAMILY HISTORY Family History  Problem Relation Age of Onset   Anxiety disorder Mother    Sleep apnea Father    Diabetes Maternal Grandmother    Hypertension Maternal Grandmother    Diabetes Maternal Grandfather    Hypertension Maternal Grandfather    Diabetes Paternal Grandmother    Hypertension Paternal Grandmother    Diabetes Paternal Grandfather    Hypertension Paternal Grandfather      Review of Systems: 12 systems were reviewed and negative except per HPI  Physical Exam: Vitals:   11/07/24 0407 11/07/24 0730  BP: (!) 144/110 (!) 142/92  Pulse: 71 69  Resp:    Temp: 98 F (36.7 C) 99.1 F (37.3 C)  SpO2: 100% 100%   No intake/output data recorded.  Intake/Output Summary (Last 24 hours) at 11/07/2024 0826 Last data filed at 11/06/2024 2206 Gross per 24 hour  Intake 63 ml  Output --  Net 63 ml   General: chronically ill-appearing, no acute distress, sitting up in bed HEENT: anicteric sclera, MMM CV: normal rate, no murmurs, trace pitting edema bilateral LEs Lungs: fine rales bilaterally with decreased breath sounds bibasilar, unlabored, normal wob Abd: soft,  non-tender, distended Skin: no visible lesions or rashes Psych: alert, engaged, appropriate mood and affect Neuro: normal speech, no gross focal deficits  Dialysis access: LUE AVF +b/t  Test Results Reviewed Lab Results  Component Value Date   NA 141 11/07/2024   K 5.3 (H) 11/07/2024   CL 98 11/07/2024   CO2 26 11/07/2024    BUN 66 (H) 11/07/2024   CREATININE 10.70 (H) 11/07/2024   CALCIUM  7.0 (L) 11/07/2024   ALBUMIN  3.2 (L) 11/07/2024   PHOS 9.1 (H) 11/07/2024    I have reviewed relevant outside healthcare records

## 2024-11-07 NOTE — Assessment & Plan Note (Signed)
-   Continue outpatient follow-up with nephrology/rheumatology service - Hemodialysis management as per renal service.

## 2024-11-07 NOTE — Assessment & Plan Note (Signed)
-   IR consulted for paracentesis - 8 L fluid removal provided - IV albumin  will be given.

## 2024-11-07 NOTE — Assessment & Plan Note (Signed)
-  Cessation counseling provided -Continue nicotine patch. 

## 2024-11-07 NOTE — TOC Initial Note (Signed)
 Transition of Care [facility]) - Initial/Assessment Note    Patient Details  Name: Cathy [name] MRN: [id number] Date of Birth: 01/12/1981  Transition of [facility]) CM/SW Contact:    Lucie Lunger, LCSWA Phone Number: 11/07/2024, 9:29 AM  Clinical Narrative:                 Pt is high risk for readmission. Pt is from home with her children. Pt has family that can assist if needed. Pt has transportation to HD appointments. Pt goes to HD TThS. TOC to follow.   Expected Discharge Plan: Home/Self Care Barriers to Discharge: Continued Medical Work up   Patient Goals and CMS Choice Patient states their goals for this hospitalization and ongoing recovery are:: return home CMS Medicare.gov Compare Post Acute Care list provided to:: Patient Choice offered to / list presented to : Patient      Expected Discharge Plan and Services In-house Referral: Clinical Social Work Discharge Planning Services: CM Consult   Living arrangements for the past 2 months: Single Family Home                                      Prior Living Arrangements/Services Living arrangements for the past 2 months: Single Family Home Lives with:: Self, Minor Children Patient language and need for interpreter reviewed:: Yes Do you feel safe going back to the place where you live?: Yes      Need for Family Participation in Patient Care: Yes (Comment)   Current home services: DME Criminal Activity/Legal Involvement Pertinent to Current Situation/Hospitalization: No - Comment as needed  Activities of Daily Living   ADL Screening (condition at time of admission) Independently performs ADLs?: Yes (appropriate for developmental age) Is the patient deaf or have difficulty hearing?: No Does the patient have difficulty seeing, even when wearing glasses/contacts?: No Does the patient have difficulty concentrating, remembering, or making decisions?: No  Permission Sought/Granted                   Emotional Assessment Appearance:: Appears stated age       Alcohol / Substance Use: Not Applicable Psych Involvement: No (comment)  Admission diagnosis:  ESRD (end stage renal disease) on dialysis (HCC) [N18.6, Z99.2] Patient Active Problem List   Diagnosis Date Noted   ESRD (end stage renal disease) on dialysis (HCC) 11/06/2024   SBP (spontaneous bacterial peritonitis) (HCC) 08/28/2024   Hypoglycemia 08/28/2024   Neuropathy 08/25/2024   Dental abscess 06/16/2024   Difficulty sleeping 06/16/2024   Acute metabolic encephalopathy 05/31/2024   Hyperammonemia 05/31/2024   NASH (nonalcoholic steatohepatitis) 05/31/2024   Abnormal pleural fluid 05/31/2024   Prolonged QT interval 05/31/2024   Cellulitis 02/20/2024   Chronic migraine w/o aura w/o status migrainosus, not intractable 12/18/2023   Vaginal discharge 12/18/2023   Elevated ferritin 10/19/2023   SLE (systemic lupus erythematosus related syndrome) (HCC) 10/19/2023   Hypertension 08/27/2023   Dyspnea 08/27/2023   Acute on chronic clinical systolic heart failure (HCC) 10/27/2022   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 cor pulmonale (HCC) 10/27/2022   Gastroesophageal reflux disease    Dysphagia    Abnormal MRI, liver    Cirrhosis of liver with ascites (HCC)    Acute hyperkalemia 06/06/2022   Abdominal pain 03/17/2022   Hypothyroidism, unspecified 03/05/2022   Tense ascites 03/02/2022   Volume overload 03/01/2022   Tobacco use 01/29/2022   Pulmonary embolism (HCC) 01/09/2022  History of pulmonary embolus 01/07/2022   Malignant hypertension 01/06/2022   Anemia due to chronic kidney disease 12/28/2021   Chronic systolic CHF (congestive heart failure) (HCC) 12/27/2021   Sepsis (HCC) 12/13/2021   Benign essential HTN 06/08/2021   HFrEF (heart failure with reduced ejection fraction) (HCC)    Nephrotic syndrome with focal and segmental glomerular lesions 03/12/2021   Encounter for antineoplastic chemotherapy  01/07/2021   End-stage renal disease on hemodialysis (HCC) 12/23/2020   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   Anxiety and depression 12/14/2020   Iron  deficiency anemia, unspecified 12/14/2020   Systemic lupus erythematosus, unspecified (HCC) 12/14/2020   Secondary hyperparathyroidism of renal origin 12/14/2020   Pericardial effusion-chronic     Interstitial edema 11/07/2020   Pelvic adhesive disease 02/03/2019   Renal hypertension 02/02/2019   Non compliance w medication regimen 01/12/2019   Anemia 10/12/2018   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) 09/20/2018   MDD (major depressive disorder) 10/11/2015   Asthma 09/22/2011   PCP:  Terry Wilhelmena Lloyd Hilario, FNP Pharmacy:   [facility] [phone] - [place], [place] - [address] AT [facility] [address] [place] [address] Phone: [phone] Fax: [phone]     Social Drivers of Health (SDOH) Social History: SDOH Screenings   Food Insecurity: No Food Insecurity (11/06/2024)  Housing: Low Risk  (11/06/2024)  Transportation Needs: No Transportation Needs (11/06/2024)  Utilities: Not At Risk (11/06/2024)  Alcohol Screen: Low Risk  (02/18/2024)  Depression (PHQ2-9): High Risk (09/26/2024)  Financial Resource Strain: Low Risk  (02/18/2024)  Physical Activity: Inactive (02/18/2024)  Social Connections: Unknown (11/06/2024)  Stress: Stress Concern Present (02/18/2024)  Tobacco Use: High Risk (11/07/2024)   SDOH Interventions:     Readmission Risk Interventions    11/07/2024    9:28 AM 09/01/2024   10:46 AM 09/01/2024   10:14 AM  Readmission Risk Prevention Plan  Transportation Screening Complete Complete Complete  Medication Review Oceanographer) Complete Complete Complete  HRI or Home Care Consult Complete Complete Complete  HRI or Home Care Consult Pt Refusal Comments  Home alone with children - Mother -n -law will come in to help patient out   SW Recovery Care/Counseling Consult Complete  Complete   Palliative Care Screening Not Applicable Not Applicable   Skilled Nursing Facility Not Applicable Not Applicable

## 2024-11-07 NOTE — Progress Notes (Signed)
 The patient unable to UF off 3 L. D/t bad cramping.Dr. Dennise notified. UF 66ff 2300 ml. Benadryl  25  mg IV given during HD.  11/07/24 1541  Vitals  BP (!) 140/90  BP Location Right Arm  BP Method Automatic  Patient Position (if appropriate) Sitting  Pulse Rate 78  Resp 20  Oxygen  Therapy  SpO2 100 %  O2 Device Nasal Cannula  O2 Flow Rate (L/min) 2 L/min  During Treatment Monitoring  Intra-Hemodialysis Comments See progress note  Post Treatment  Dialyzer Clearance Lightly streaked  Hemodialysis Intake (mL) 0 mL  Liters Processed 70  Fluid Removed (mL) 2300 mL  Tolerated HD Treatment Yes  Post-Hemodialysis Comments see notes.  AVG/AVF Arterial Site Held (minutes) 10 minutes  AVG/AVF Venous Site Held (minutes) 10 minutes  Fistula / Graft Left Upper arm Arteriovenous fistula  Placement Date: (c)    Placed prior to admission: Yes  Orientation: Left  Access Location: Upper arm  Access Type: Arteriovenous fistula  Site Condition No complications  Fistula / Graft Assessment Present;Thrill;Bruit  Status Deaccessed  Needle Size 15  Drainage Description None

## 2024-11-07 NOTE — Assessment & Plan Note (Signed)
-   Continue current antihypertensive management.

## 2024-11-07 NOTE — Assessment & Plan Note (Signed)
-   Heart healthy/low-sodium diet discussed with patient - Continue to follow daily weights - Continue volume management with dialysis.

## 2024-11-07 NOTE — Plan of Care (Signed)
   Problem: Education: Goal: Knowledge of General Education information will improve Description Including pain rating scale, medication(s)/side effects and non-pharmacologic comfort measures Outcome: Progressing   Problem: Health Behavior/Discharge Planning: Goal: Ability to manage health-related needs will improve Outcome: Progressing

## 2024-11-07 NOTE — Assessment & Plan Note (Signed)
-  plan for HD today and then again on tomorrow to get patient back on on regular schedule. -Follow clinical response and further recommendations by nephrology service.

## 2024-11-07 NOTE — Progress Notes (Signed)
 Patient tolerated right sided Paracentesis procedure well today and 8 Liters of ascites removed. Patient verbalized understanding of post procedure instructions and transported via stretcher to dialysis at this time with no acute distress noted. Hospitalist made aware of the amount of ascites removed today.

## 2024-11-07 NOTE — Progress Notes (Signed)
 Pt still does not want to remove her one piece onsie PJ outfit that she is currently wearing as pt states she doesn't feel the best at this time and doesn't want to become cold as she is warm and comfortable in her current outfit. Pt did refuse to wear a hospital gown as well. Pt also refused CHG bath this am upon this writer and CNA offering to assist her with bathing if need be. Pt again stated she did not want to get cold and she didn't feel up for it but pt did state that she would take her CHG bath after breakfast this am. Will update charge nurse about this and also will pass this information along to the oncoming RN who will be taking over this patients care this morning. Pt has had no loose stools during the night. A hat was placed in the patients bathroom in her toilet upon her being admitted to this unit and pt was instructed that if she needed to have a BM, to use the toilet and allow her  BM to go into the hat for collection and testing purposes but not to void (since the patient does still make small amounts of urine at times) in the hat with her BM and not to place any toilet tissue in the hat as it would cause any stool she may be able to provide to become contaminated. Pt understood instructions, and stated that she would call staff if she did in fact have a BM but thus far, she has not. Remains on contact enteric precautions at this time until a stool sample can be collected to R/O Cdiff as well as a GI Panel.

## 2024-11-08 MED ORDER — HYDROMORPHONE HCL 1 MG/ML IJ SOLN
1.0000 mg | Freq: Once | INTRAMUSCULAR | Status: AC
  Administered 2024-11-08: 1 mg via INTRAVENOUS
  Filled 2024-11-08: qty 1

## 2024-11-08 MED ORDER — LANTHANUM CARBONATE 500 MG PO CHEW
1000.0000 mg | CHEWABLE_TABLET | Freq: Three times a day (TID) | ORAL | Status: DC
  Administered 2024-11-08 – 2024-11-09 (×3): 1000 mg via ORAL
  Filled 2024-11-08 (×7): qty 2

## 2024-11-08 NOTE — Assessment & Plan Note (Signed)
-   IV iron  and Epogen  as per nephrology discretion.

## 2024-11-08 NOTE — Assessment & Plan Note (Signed)
-   8 L fluid removal provided with paracentesis on 11/07/2024 -Albumin  provided. - IV albumin  will be given. -Continue as needed analgesics.

## 2024-11-08 NOTE — Progress Notes (Signed)
 Patient ID: Cathy [name], female   DOB: [birth year], 43 y.o.   MRN: [id number] [facility] Progress Note   Assessment/ Plan:   1.  Acute hypoxic respiratory failure: Secondary to volume overload/acute exacerbation of systolic heart failure in the setting of missed hemodialysis. 2. ESRD: Typically on TTS dialysis schedule and underwent hemodialysis emergently yesterday for volume unloading.  On schedule for dialysis again today to resume outpatient schedule. 3. Anemia: Without overt blood loss, improved hemoglobin and hematocrit overnight and will continue to follow trend to decide on need for PRBC/ESA adjustments. 4. CKD-MBD: Calcium  level at goal with significantly elevated phosphorus level.  She is on calcitriol  for PTH control and I will begin lanthanum  1 g 3 times a day with meals for phosphorus control. 5. Nutrition: Continue renal diet with sodium and fluid restriction. 6. Hypertension: Blood pressure currently at goal, she does not appear to be significantly hypervolemic on exam and UF goal will be cautious 1.5 L.  Subjective:   Reports that she feels much better after 8 L paracentesis yesterday.  Shortness of breath has improved.   Objective:   BP 122/76 (BP Location: Right Arm)   Pulse 76   Temp 98.2 F (36.8 C) (Oral)   Resp 20   Ht 5' 6 (1.676 m)   Wt 71.7 kg   LMP 04/07/2020 (Within Months)   SpO2 100%   BMI 25.51 kg/m   Physical Exam: Gen: Appears comfortable resting in bed, on oxygen  via nasal cannula CVS: Pulse regular rhythm, normal rate, S1 and S2 normal Resp: Decreased breath sounds over bases, no distinct rales or rhonchi Abd: Soft, moderately distended, nontender, bowel sounds normal Ext: Trace edema over ankles, left upper arm AV fistula with intact dressings  Labs: BMET Recent Labs  Lab 11/06/24 1554 11/07/24 0422  NA 140 141  K 5.1 5.3*  CL 95* 98  CO2 25 26  GLUCOSE 77 85  BUN 56* 66*  CREATININE 9.84* 10.70*  CALCIUM  7.4* 7.0*   PHOS  --  9.1*   CBC Recent Labs  Lab 11/06/24 1554 11/07/24 0422  WBC 5.5  --   NEUTROABS 4.1  --   HGB 8.5* 7.5*  HCT 28.8* 25.3*  MCV 91.7  --   PLT 279  --       Medications:     acidophilus  1 capsule Oral TID   amLODipine   10 mg Oral q AM   aspirin  EC  81 mg Oral Daily   atorvastatin   40 mg Oral Daily   busPIRone   7.5 mg Oral BID   calcitRIOL   0.25 mcg Oral Q M,W,F-HD   carvedilol   12.5 mg Oral BID WC   Chlorhexidine  Gluconate Cloth  6 each Topical Q0600   cloNIDine   0.1 mg Oral BID   darbepoetin (ARANESP ) injection - DIALYSIS  100 mcg Subcutaneous Q Mon-1800   gabapentin   300 mg Oral QHS   heparin   5,000 Units Subcutaneous Q8H   hydrALAZINE   100 mg Oral TID   levETIRAcetam   500 mg Oral Daily   And   levETIRAcetam   250 mg Oral Q T,Th,Sa-HD   losartan   100 mg Oral Daily   nicotine   21 mg Transdermal Daily   pantoprazole   40 mg Oral QAC breakfast   sodium chloride  flush  3 mL Intravenous Q12H   sodium chloride  flush  3 mL Intravenous Q12H   Gordy Blanch, MD 11/08/2024, 8:41 AM

## 2024-11-08 NOTE — Assessment & Plan Note (Signed)
-   Continue current antihypertensive agents and volume management with dialysis. - Follow vital signs. - Continue heart healthy/low-sodium diet.

## 2024-11-08 NOTE — Plan of Care (Signed)

## 2024-11-08 NOTE — Assessment & Plan Note (Signed)
-  VQ scan negative for acute PE - Continue volume management and stabilization - Continue to wean off oxygen  supplementation as tolerated.

## 2024-11-08 NOTE — Assessment & Plan Note (Signed)
-   Status post hemodialysis on 12/25; planning for dialysis again later today to get patient back on schedule and continue adjusted volume status.   - Patient mainly requiring adjustment to her dry weight.   - Renal/low-sodium diet discussed with patient.   - Continue to follow clinical response and further recommendations by nephrology service.

## 2024-11-08 NOTE — Assessment & Plan Note (Signed)
-   Continue outpatient follow-up with nephrology/rheumatology service - Hemodialysis management as per renal service.

## 2024-11-08 NOTE — Assessment & Plan Note (Signed)
-  Cessation counseling provided -Continue nicotine patch. 

## 2024-11-08 NOTE — Assessment & Plan Note (Signed)
-   Continue current antihypertensive management. -Heart healthy/low-sodium diet discussed with patient.

## 2024-11-08 NOTE — Assessment & Plan Note (Signed)
-   Heart healthy/low-sodium diet discussed with patient - Continue to follow daily weights - Continue volume management with dialysis.

## 2024-11-08 NOTE — Progress Notes (Signed)
 PROGRESS NOTE    Cathy [name]  FMW:[id number] DOB: 1981/07/15 DOA: 11/06/2024 PCP: Terry Wilhelmena Lloyd Hilario, FNP (  Chief Complaint  Patient presents with   Shortness of Breath   Nausea    Brief Narrative: As per H&P written by Dr. Pearlean on 11/06/24 Cathy [name] is a 43 y.o. 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 - COVID flu and RSV negative in the ED - proBNP elevated, D-dimer chronically elevated - Initial troponin 626, repeat 567--EKG sinus rhythm with LVH Hgb 8.5 , WBC and Platelets are WNL K=5.1 Na 140, Creatinine 9.84 - CT chest, abdomen and pelvis---IMPRESSION: 1. Small bilateral pleural effusions, left greater than right, increased from prior study. 2. Large volume ascites, increased from prior study.  Assessment & Plan: Assessment & Plan End-stage renal disease on hemodialysis [facility]) - Status post hemodialysis on 12/25; planning for dialysis again later today to get patient back on schedule and continue adjusted volume status.   - Patient mainly requiring adjustment to her dry weight.   - Renal/low-sodium diet discussed with patient.   - Continue to follow clinical response and further recommendations by nephrology service. Benign essential HTN - Continue current antihypertensive  management. -Heart healthy/low-sodium diet discussed with patient. Chronic systolic CHF (congestive heart failure) (HCC) Volume overload - Heart healthy/low-sodium diet discussed with patient - Continue to follow daily weights - Continue volume management with dialysis. History of pulmonary embolus -VQ scan negative for acute PE - Continue volume management and stabilization - Continue to wean off oxygen  supplementation as tolerated. 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 Anemia due to chronic kidney disease - IV iron  and Epogen  as per nephrology discretion. Malignant hypertension - Continue current antihypertensive agents and volume management with dialysis. - Follow vital signs. - Continue heart healthy/low-sodium diet. Tense ascites - 8 L fluid removal provided with paracentesis on 11/07/2024 -Albumin  provided. - IV albumin  will be given. -Continue as needed analgesics. Tobacco use -Cessation counseling provided. - Continue nicotine  patch.  DVT prophylaxis: Heparin . Code Status: Full code. Family Communication: No family at bedside. Disposition:   Status is: Inpatient Remains inpatient appropriate because: Continue further management for fluid overload.  Wean off oxygen  supplementation as tolerated.   Consultants:  Nephrology service/IR  Procedures:  -See below for x-ray reports. -Status post paracentesis (11/07/2024).   Antimicrobials:  None   Subjective: No overt bleeding; no nausea, no vomiting, 2 L nasal cannula in place with overall improvement in volume status and feeling better.  Reports history of intermittent loose stools.  Patient is afebrile.  Complaining of intermittent ongoing  abdominal discomfort.   Objective: Vitals:   11/08/24 1600 11/08/24 1630 11/08/24 1700 11/08/24 1730  BP: (!) 140/93 (!) 139/92 (!) 146/99 ROLLEN)  139/90  Pulse: 76 78 77 78  Resp: 20 20 18 18   Temp:      TempSrc:      SpO2: 100%  100% 100%  Weight:      Height:        Intake/Output Summary (Last 24 hours) at 11/08/2024 1814 Last data filed at 11/08/2024 1300 Gross per 24 hour  Intake 483 ml  Output --  Net 483 ml   Filed Weights   11/07/24 1238 11/07/24 1536 11/08/24 1543  Weight: 74 kg 71.7 kg 71.7 kg    Examination: General exam: Alert, awake, oriented x 3; reporting some abdominal discomfort.  No nausea, no vomiting, no chest pain and reports improvement in breathing. Respiratory system: Decreased breath sounds at the bases; no using accessory muscle.  2 L nasal cannula in place. Cardiovascular system:RRR.  No rubs, no gallops, no JVD. Gastrointestinal system: Abdomen is soft, without guarding demonstrating significant improvement in distention after 8 L removed with paracentesis on 11/07/2024. Central nervous system: No focal neurological deficits. Extremities: No cyanosis or clubbing. Skin: No petechiae. Psychiatry: Judgement and insight appear normal. Mood & affect appropriate.   Data Reviewed: I have personally reviewed following labs and imaging studies  CBC: Recent Labs  Lab 11/06/24 1554 11/07/24 0422  WBC 5.5  --   NEUTROABS 4.1  --   HGB 8.5* 7.5*  HCT 28.8* 25.3*  MCV 91.7  --   PLT 279  --     Basic Metabolic Panel: Recent Labs  Lab 11/06/24 1554 11/07/24 0422  NA 140 141  K 5.1 5.3*  CL 95* 98  CO2 25 26  GLUCOSE 77 85  BUN 56* 66*  CREATININE 9.84* 10.70*  CALCIUM  7.4* 7.0*  PHOS  --  9.1*    GFR: Estimated Creatinine Clearance: 7 mL/min (A) (by C-G formula based on SCr of 10.7 mg/dL (H)).  Liver Function Tests: Recent Labs  Lab 11/06/24 1554 11/07/24 0422  AST 21  --   ALT 8  --   ALKPHOS 372*  --   BILITOT 0.5  --   PROT 7.3   --   ALBUMIN  3.8 3.2*    CBG: No results for input(s): GLUCAP in the last 168 hours.   Recent Results (from the past 240 hours)  Resp panel by RT-PCR (RSV, Flu A&B, Covid) Anterior Nasal Swab     Status: None   Collection Time: 11/06/24  4:53 PM   Specimen: Anterior Nasal Swab  Result Value Ref Range Status   SARS Coronavirus 2 by RT PCR NEGATIVE NEGATIVE Final    Comment: (NOTE) SARS-CoV-2 target nucleic acids are NOT DETECTED.  The SARS-CoV-2 RNA is generally detectable in upper respiratory specimens during the acute phase of infection. The lowest concentration of SARS-CoV-2 viral copies this assay can detect is 138 copies/mL. A negative result does not preclude SARS-Cov-2 infection and should not be used as the sole basis for treatment or other patient management decisions. A negative result may occur with  improper specimen collection/handling, submission of specimen other than nasopharyngeal swab, presence of viral mutation(s) within the areas targeted by this assay, and inadequate number of viral copies(<138 copies/mL). A negative result must be combined with clinical observations, patient history, and epidemiological information. The expected result is Negative.  Fact Sheet for Patients:  bloggercourse.com  Fact Sheet for Healthcare Providers:  seriousbroker.it  This test is no t yet approved or cleared by the [place]  FDA and  has been authorized for detection  and/or diagnosis of SARS-CoV-2 by FDA under an Emergency Use Authorization (EUA). This EUA will remain  in effect (meaning this test can be used) for the duration of the COVID-19 declaration under Section 564(b)(1) of the Act, 21 U.S.C.section 360bbb-3(b)(1), unless the authorization is terminated  or revoked sooner.       Influenza A by PCR NEGATIVE NEGATIVE Final   Influenza B by PCR NEGATIVE NEGATIVE Final    Comment: (NOTE) The Xpert Xpress  SARS-CoV-2/FLU/RSV plus assay is intended as an aid in the diagnosis of influenza from Nasopharyngeal swab specimens and should not be used as a sole basis for treatment. Nasal washings and aspirates are unacceptable for Xpert Xpress SARS-CoV-2/FLU/RSV testing.  Fact Sheet for Patients: bloggercourse.com  Fact Sheet for Healthcare Providers: seriousbroker.it  This test is not yet approved or cleared by the [place]  FDA and has been authorized for detection and/or diagnosis of SARS-CoV-2 by FDA under an Emergency Use Authorization (EUA). This EUA will remain in effect (meaning this test can be used) for the duration of the COVID-19 declaration under Section 564(b)(1) of the Act, 21 U.S.C. section 360bbb-3(b)(1), unless the authorization is terminated or revoked.     Resp Syncytial Virus by PCR NEGATIVE NEGATIVE Final    Comment: (NOTE) Fact Sheet for Patients: bloggercourse.com  Fact Sheet for Healthcare Providers: seriousbroker.it  This test is not yet approved or cleared by the [place]  FDA and has been authorized for detection and/or diagnosis of SARS-CoV-2 by FDA under an Emergency Use Authorization (EUA). This EUA will remain in effect (meaning this test can be used) for the duration of the COVID-19 declaration under Section 564(b)(1) of the Act, 21 U.S.C. section 360bbb-3(b)(1), unless the authorization is terminated or revoked.  Performed at [facility], [address] [place], [place] [address]     Radiology Studies: US  Paracentesis Result Date: 11/07/2024 INDICATION: 43 year old [name]. History of end-stage renal disease with recurrent ascites. Request is for therapeutic paracentesis. EXAM: ULTRASOUND GUIDED THERAPEUTIC PARACENTESIS MEDICATIONS: Lidocaine  1% 10 mL COMPLICATIONS: None immediate. PROCEDURE: Informed written consent was obtained from the patient  after a discussion of the risks, benefits and alternatives to treatment. A timeout was performed prior to the initiation of the procedure. Initial ultrasound scanning demonstrates a large amount of ascites within the right lower abdominal quadrant. The right lower abdomen was prepped and draped in the usual sterile fashion. 1% lidocaine  was used for local anesthesia. Following this, a 19 gauge, 7-cm, Yueh catheter was introduced. An ultrasound image was saved for documentation purposes. The paracentesis was performed. The catheter was removed and a dressing was applied. The patient tolerated the procedure well without immediate post procedural complication. Patient received post-procedure intravenous albumin ; see nursing notes for details. FINDINGS: A total of approximately 8 L of straw-colored fluid was removed. IMPRESSION: Successful ultrasound-guided therapeutic paracentesis yielding 8 liters of peritoneal straw-colored fluid. Performed by Delon Beagle NP Electronically Signed   By: Ester Sides M.D.   On: 11/07/2024 12:31   NM Pulmonary Perfusion Result Date: 11/06/2024 EXAM: NM Lung Perfusion Scan. CLINICAL HISTORY: chest pain, shortness of breath TECHNIQUE: Radiolabeled MAA was administered intravenously and planar images of the lungs were obtained in multiple projections. RADIOPHARMACEUTICAL: 4.4 mCi Technetium-53m albumin  aggregated (MAA). COMPARISON: Chest CT today. FINDINGS: PERFUSION: No perfusion defects to suggest pulmonary embolus. IMPRESSION: 1. No perfusion defects to indicate pulmonary emboli. Electronically signed by: Franky Crease MD 11/06/2024 08:40 PM EST RP Workstation: [id number]    Scheduled Meds:  acidophilus  1  capsule Oral TID   amLODipine   10 mg Oral q AM   aspirin  EC  81 mg Oral Daily   atorvastatin   40 mg Oral Daily   busPIRone   7.5 mg Oral BID   calcitRIOL   0.25 mcg Oral Q M,W,F-HD   carvedilol   12.5 mg Oral BID WC   Chlorhexidine  Gluconate Cloth  6 each Topical  Q0600   cloNIDine   0.1 mg Oral BID   darbepoetin (ARANESP ) injection - DIALYSIS  100 mcg Subcutaneous Q Mon-1800   gabapentin   300 mg Oral QHS   heparin   5,000 Units Subcutaneous Q8H   hydrALAZINE   100 mg Oral TID   lanthanum   1,000 mg Oral TID WC   levETIRAcetam   500 mg Oral Daily   And   levETIRAcetam   250 mg Oral Q T,Th,Sa-HD   losartan   100 mg Oral Daily   nicotine   21 mg Transdermal Daily   pantoprazole   40 mg Oral QAC breakfast   sodium chloride  flush  3 mL Intravenous Q12H   sodium chloride  flush  3 mL Intravenous Q12H   Continuous Infusions:   LOS: 2 days    Time spent: 50 minutes    Eric Nunnery, MD [facility]   To contact the attending provider between 7A-7P or the covering provider during after hours 7P-7A, please log into the web site [email or web address] and access using universal [place] password for that web site. If you do not have the password, please call the hospital operator.  11/08/2024, 6:14 PM

## 2024-11-08 NOTE — Progress Notes (Signed)
 The patient's dialysis site experienced prolonged bleeding. It stopped after 15 minutes,with approximately 30 ml of blood loss.  11/08/24 1900  Vitals  Temp 98.4 F (36.9 C)  Temp Source Oral  BP (!) 150/90  BP Location Right Arm  BP Method Automatic  Patient Position (if appropriate) Lying  Pulse Rate 78  Resp 18  Oxygen  Therapy  SpO2 100 %  O2 Device Nasal Cannula  O2 Flow Rate (L/min) 2 L/min  During Treatment Monitoring  HD Safety Checks Performed Yes  Intra-Hemodialysis Comments Tx completed  Post Treatment  Dialyzer Clearance Lightly streaked  Hemodialysis Intake (mL) 0 mL  Liters Processed 70  Fluid Removed (mL) 1500 mL  Tolerated HD Treatment Yes  Post-Hemodialysis Comments see notes.  AVG/AVF Arterial Site Held (minutes) 15 minutes  AVG/AVF Venous Site Held (minutes) 15 minutes  Fistula / Graft Left Upper arm Arteriovenous fistula  Placement Date: (c)    Placed prior to admission: Yes  Orientation: Left  Access Location: Upper arm  Access Type: Arteriovenous fistula  Site Condition No complications  Fistula / Graft Assessment Present;Thrill;Bruit  Status Deaccessed  Needle Size 15  Drainage Description None

## 2024-11-09 DIAGNOSIS — I509 Heart failure, unspecified: Secondary | ICD-10-CM

## 2024-11-09 DIAGNOSIS — R188 Other ascites: Secondary | ICD-10-CM

## 2024-11-09 MED ORDER — OXYCODONE HCL 5 MG PO TABS: 5.0000 mg | ORAL_TABLET | Freq: Three times a day (TID) | ORAL | 0 refills | Status: DC | PRN

## 2024-11-09 MED ORDER — SACCHAROMYCES BOULARDII 250 MG PO CAPS: 250.0000 mg | ORAL_CAPSULE | Freq: Two times a day (BID) | ORAL | 0 refills | Status: DC

## 2024-11-09 NOTE — Plan of Care (Signed)
°  Problem: Education: Goal: Knowledge of General Education information will improve Description: Including pain rating scale, medication(s)/side effects and non-pharmacologic comfort measures Outcome: Progressing   Problem: Health Behavior/Discharge Planning: Goal: Ability to manage health-related needs will improve Outcome: Progressing   Problem: Clinical Measurements: Goal: Ability to maintain clinical measurements within normal limits will improve Outcome: Progressing Goal: Respiratory complications will improve Outcome: Progressing   Problem: Activity: Goal: Risk for activity intolerance will decrease Outcome: Progressing   Problem: Pain Managment: Goal: General experience of comfort will improve and/or be controlled Outcome: Progressing   Problem: Safety: Goal: Ability to remain free from injury will improve Outcome: Progressing

## 2024-11-09 NOTE — Plan of Care (Signed)
   Problem: Education: Goal: Knowledge of General Education information will improve Description Including pain rating scale, medication(s)/side effects and non-pharmacologic comfort measures Outcome: Progressing

## 2024-11-09 NOTE — Care Management Important Message (Signed)
 Important Message  Patient Details  Name: Cathy [name] MRN: [id number] Date of Birth: 1980/12/30   Important Message Given:  N/A - LOS <3 / Initial given by admissions     Cathy [name] 11/09/2024, 1:29 PM

## 2024-11-09 NOTE — Discharge Summary (Signed)
 Physician Discharge Summary   Patient: Cathy [name] MRN: [id number] DOB: [birth year]  Admit date:     11/06/2024  Discharge date: 11/09/24  Discharge Physician: Eric Nunnery   PCP: Terry Wilhelmena Lloyd Hilario, FNP   Recommendations at discharge:  Repeat CBC to follow hemoglobin trend/stability Make sure patient follow-up with gastroenterology service as instructed Make sure patient is compliant with her dialysis treatment as an outpatient. Reassess blood pressure and adjust antihypertensive treatment as needed.  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 Pearlean on 11/06/24 [place] DELENA [name] is a 43 y.o. female with medical history significant for recent diagnosis of MSSA bacteremia (completed treatment), liver cirrhosis with recurrent ascites and chronic thrombocytopenia, depression, SLE with pericarditis and lupus nephritis resulting in ESRD on HD -TTS schedule, history of pulmonary embolism (not on chronic anticoagulation due to chronic anemia and chronic thrombocytopenia), anemia of ESRD, history of seizures, HTN,  bipolar/ depression and chronic diarrhea presenting to the ED with increasing shortness of breath, chest discomfort and new oxygen  requirement  - Patient missed HD on 11/05/2024 -No fever  Or chills  - Patient  has nausea, diarrhea is not new she continues to have ongoing diarrhea uses Imodium  as needed. - COVID flu and RSV negative in the ED - proBNP elevated, D-dimer chronically elevated - Initial troponin 626, repeat 567--EKG sinus rhythm with LVH Hgb 8.5 , WBC and Platelets are WNL K=5.1 Na 140, Creatinine 9.84 - CT chest, abdomen and pelvis---IMPRESSION: 1. Small bilateral pleural effusions, left greater than right, increased from prior study. 2. Large volume ascites, increased from prior study.  Assessment and Plan: End-stage renal disease on hemodialysis [facility]) - Status post hemodialysis on 11/07/24 and again on 11/08/2024 to get patient back on schedule.   - Patient might require adjustment to her dry weight.   - Renal/low-sodium diet discussed with patient.   - Safe for discharge with resumption of hemodialysis as an outpatient (next treatment 11/10/2024).  Benign essential HTN - Continue current antihypertensive agents and volume management with dialysis. - Follow vital signs. - Patient educated regarding importance/compliance of heart healthy/low-sodium diet.  Chronic systolic CHF (congestive heart failure) (HCC) Volume overload - Heart healthy/low-sodium diet discussed with patient - Continue to follow daily weights - Continue volume management with dialysis.  History of pulmonary embolus -VQ scan negative for acute PE - Continue volume management and stabilization - Despite volume optimization patient demonstrated the need of 2 L nasal cannula supplementation especially with exertion - Home oxygen  has been arranged at discharge. - Continue to assess stability for discontinuation down the road.  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 - Patient back  on regular schedule as per outpatient treatments (T-T-S). - Education regarding compliance provided to patient.  Anemia due to chronic kidney disease - IV iron  and Epogen  as per nephrology discretion. - Hemoglobin stable.  Tense ascites - 8 L fluid removal provided with paracentesis on 11/07/2024 -Albumin  provided. -Continue as needed analgesics. - Patient advised to be compliant with low-sodium diet and to follow-up with gastroenterology as an outpatient.  Tobacco use -Cessation counseling provided. - Continue nicotine  patch.  Consultants: Nephrology service. Procedures performed: See below for x-ray reports. Disposition: Home Diet recommendation: Renal/low-sodium diet.   DISCHARGE MEDICATION: Allergies as of 11/09/2024       Reactions   Dialyvite 800 [nephro-vite] Nausea And Vomiting        Medication List     STOP taking these medications    loperamide  2 MG tablet Commonly known as: Imodium  A-D       TAKE these medications    acetaminophen  325 MG tablet Commonly known as: TYLENOL  Take 2 tablets (650 mg total) by mouth every 6 (six) hours as needed for mild pain (pain score 1-3) or fever (or Fever >/= 101).   albuterol  108 (90 Base) MCG/ACT inhaler Commonly known as: VENTOLIN  HFA Inhale 2 puffs into the lungs every 6 (six) hours as needed for wheezing or shortness of breath.   amLODipine  10 MG tablet Commonly known as: NORVASC  Take 1 tablet (10 mg total) by mouth in the morning.   aspirin  EC 81 MG tablet Take 1 tablet (81 mg total) by mouth daily. Swallow whole.   atorvastatin  40 MG tablet Commonly known as: LIPITOR Take 1 tablet (40 mg total) by mouth daily.   busPIRone  7.5 MG tablet Commonly known as: BUSPAR  Take 1 tablet (7.5 mg total) by mouth 2 (two) times daily.   carvedilol  12.5 MG tablet Commonly known as: COREG  Take 1 tablet (12.5 mg total) by mouth 2 (two)  times daily with a meal.   cloNIDine  0.1 MG tablet Commonly known as: CATAPRES  Take 1 tablet (0.1 mg total) by mouth 2 (two) times daily. For BP What changed: when to take this   gabapentin  300 MG capsule Commonly known as: NEURONTIN  Take 1 capsule (300 mg total) by mouth at bedtime.   hydrALAZINE  100 MG tablet Commonly known as: APRESOLINE  Take 1 tablet (100 mg total) by mouth 3 (three) times daily.   hydrOXYzine  25 MG tablet Commonly known as: ATARAX  Take 1 tablet (25 mg total) by mouth every 12 (twelve) hours as needed for anxiety or itching.   levETIRAcetam  250 MG tablet Commonly known as: Keppra  Take 1 tablet (250 mg total) by mouth See admin instructions. Take 2 tablets (500 mg total) by mouth daily AND Take extra 250mg  dose after dialysis on Tuesday, Thursday, and Saturday.   losartan  100 MG tablet Commonly known as: COZAAR  Take 1 tablet (100 mg total) by mouth daily.   methocarbamol  500 MG tablet Commonly known as: ROBAXIN  Take 1 tablet (500 mg total) by mouth every 6 (six) hours as needed for muscle spasms.   Omron 3 Series BP Monitor Devi Use as directed 2 times a day   oxyCODONE  5 MG immediate release tablet Commonly known as: Oxy IR/ROXICODONE  Take 1 tablet (5 mg total) by mouth every 8 (eight) hours as needed for severe pain (pain score 7-10).   pantoprazole  40 MG tablet Commonly known as: PROTONIX  Take 1 tablet (40 mg total) by mouth daily before breakfast.   saccharomyces boulardii 250 MG capsule Commonly known as: Florastor  Take 1 capsule (250 mg total) by mouth 2 (two) times daily.               Durable Medical Equipment  (From admission, onward)           Start     Ordered   11/09/24 1046  For home use only DME oxygen   Once       Question Answer Comment  Length of Need 12 Months   Mode or (Route) Nasal cannula   Liters per Minute 2   Frequency Continuous (stationary and portable oxygen  unit needed)   Oxygen  conserving device Yes    Oxygen  delivery system: Gas      11/09/24 1046            Follow-up Information     Del Wilhelmena Lloyd Sola, FNP. Schedule an appointment as soon as possible for a visit in 10 day(s).   Specialty: Family Medicine Contact information: [address] [place] [address] [phone]                Discharge Exam: Fredricka Weights   11/07/24 1238 11/07/24 1536 11/08/24 1543  Weight: 74 kg 71.7 kg 71.7 kg   General exam: Alert, awake, oriented x 3; reporting some abdominal discomfort.  No nausea, no vomiting, no chest pain and reports significant improvement in her breathing. Respiratory system: Decreased breath sounds at the bases; no using accessory muscle.  2 L nasal cannula in place. Cardiovascular system:RRR.  No rubs, no gallops, no JVD. Gastrointestinal system: Abdomen is soft, without guarding demonstrating significant improvement in distention after 8 L removed with paracentesis on 11/07/2024. Central nervous system: No focal neurological deficits. Extremities: No cyanosis or clubbing. Skin: No petechiae. Psychiatry: Judgement and insight appear normal. Mood & affect appropriate.   Condition at discharge: Stable and improved.  The results of significant diagnostics from this hospitalization (including imaging, microbiology, ancillary and laboratory) are listed below for reference.   Imaging Studies: US  Paracentesis Result Date: 11/07/2024 INDICATION: 43 year old female. History of end-stage renal disease with recurrent ascites. Request is for therapeutic paracentesis. EXAM: ULTRASOUND GUIDED THERAPEUTIC PARACENTESIS MEDICATIONS: Lidocaine  1% 10 mL COMPLICATIONS: None immediate. PROCEDURE: Informed written consent was obtained from the patient after a discussion of the risks, benefits and alternatives to treatment. A timeout was performed prior to the initiation of the procedure. Initial ultrasound scanning demonstrates a large amount of ascites within the  right lower abdominal quadrant. The right lower abdomen was prepped and draped in the usual sterile fashion. 1% lidocaine  was used for local anesthesia. Following this, a 19 gauge, 7-cm, Yueh catheter was introduced. An ultrasound image was saved for documentation purposes. The paracentesis was performed. The catheter was removed and a dressing was applied. The patient tolerated the procedure well without immediate post procedural complication. Patient received post-procedure intravenous albumin ; see nursing notes for details. FINDINGS: A total of approximately 8 L of straw-colored fluid was removed. IMPRESSION: Successful ultrasound-guided therapeutic paracentesis yielding 8 liters of peritoneal straw-colored fluid. Performed by Delon Beagle NP Electronically Signed   By: Ester Sides M.D.   On: 11/07/2024 12:31   NM Pulmonary Perfusion Result Date: 11/06/2024 EXAM: NM Lung Perfusion Scan. CLINICAL HISTORY: chest pain, shortness of breath TECHNIQUE: Radiolabeled MAA was administered intravenously and planar images of the lungs were obtained in multiple projections. RADIOPHARMACEUTICAL: 4.4 mCi Technetium-53m albumin  aggregated (MAA). COMPARISON: Chest CT today. FINDINGS: PERFUSION: No perfusion defects to suggest pulmonary embolus. IMPRESSION: 1. No perfusion defects to indicate  pulmonary emboli. Electronically signed by: Franky Crease MD 11/06/2024 08:40 PM EST RP Workstation: [id number]   CT CHEST ABDOMEN PELVIS WO CONTRAST Result Date: 11/06/2024 EXAM: CT CHEST, ABDOMEN AND PELVIS WITHOUT CONTRAST 11/06/2024 05:53:02 PM TECHNIQUE: CT of the chest, abdomen and pelvis was performed without the administration of intravenous contrast. Multiplanar reformatted images are provided for review. Automated exposure control, iterative reconstruction, and/or weight based adjustment of the mA/kV was utilized to reduce the radiation dose to as low as reasonably achievable. COMPARISON: Abdominal CT 10/09/2024. Chest  CT 08/28/2024. CLINICAL HISTORY: Short of breath, distended abdomen, generalized abdominal pain, nausea, diarrhea. FINDINGS: CHEST: MEDIASTINUM AND LYMPH NODES: Cardiomegaly. Scattered coronary artery and aortic atherosclerosis. The central airways are clear. No mediastinal, hilar or axillary lymphadenopathy. LUNGS AND PLEURA: Small bilateral pleural effusions, left greater than right, increasing since prior study. Compressive atelectasis in the lingula and left lower lobe. Chronic opacity peripherally in the right lung base, likely scarring. No pneumothorax. ABDOMEN AND PELVIS: LIVER: The liver is unremarkable. GALLBLADDER AND BILE DUCTS: Gallbladder is unremarkable. No biliary ductal dilatation. SPLEEN: No acute abnormality. PANCREAS: No acute abnormality. ADRENAL GLANDS: No acute abnormality. KIDNEYS, URETERS AND BLADDER: Kidneys are atrophic. No stones in the kidneys or ureters. No hydronephrosis. No perinephric or periureteral stranding. Urinary bladder is unremarkable. GI AND BOWEL: Stomach demonstrates no acute abnormality. There is no bowel obstruction. REPRODUCTIVE ORGANS: No acute abnormality. PERITONEUM AND RETROPERITONEUM: Large volume ascites, increasing since prior study. No free air. VASCULATURE: Aorta is normal in caliber. Aortic atherosclerosis. ABDOMINAL AND PELVIS LYMPH NODES: No lymphadenopathy. REPRODUCTIVE ORGANS: No acute abnormality. BONES AND SOFT TISSUES: No acute osseous abnormality. No focal soft tissue abnormality. IMPRESSION: 1. Small bilateral pleural effusions, left greater than right, increased from prior study. 2. Large volume ascites, increased from prior study. 3. Coronary artery disease, aortic atherosclerosis. Electronically signed by: Franky Crease MD 11/06/2024 06:16 PM EST RP Workstation: [id number]    Microbiology: Results for orders placed or performed during the hospital encounter of 11/06/24  Resp panel by RT-PCR (RSV, Flu A&B, Covid) Anterior Nasal Swab     Status:  None   Collection Time: 11/06/24  4:53 PM   Specimen: Anterior Nasal Swab  Result Value Ref Range Status   SARS Coronavirus 2 by RT PCR NEGATIVE NEGATIVE Final    Comment: (NOTE) SARS-CoV-2 target nucleic acids are NOT DETECTED.  The SARS-CoV-2 RNA is generally detectable in upper respiratory specimens during the acute phase of infection. The lowest concentration of SARS-CoV-2 viral copies this assay can detect is 138 copies/mL. A negative result does not preclude SARS-Cov-2 infection and should not be used as the sole basis for treatment or other patient management decisions. A negative result may occur with  improper specimen collection/handling, submission of specimen other than nasopharyngeal swab, presence of viral mutation(s) within the areas targeted by this assay, and inadequate number of viral copies(<138 copies/mL). A negative result must be combined with clinical observations, patient history, and epidemiological information. The expected result is Negative.  Fact Sheet for Patients:  bloggercourse.com  Fact Sheet for Healthcare Providers:  seriousbroker.it  This test is no t yet approved or cleared by the [place]  FDA and  has been authorized for detection and/or diagnosis of SARS-CoV-2 by FDA under an Emergency Use Authorization (EUA). This EUA will remain  in effect (meaning this test can be used) for the duration of the COVID-19 declaration under Section 564(b)(1) of the Act, 21 U.S.C.section 360bbb-3(b)(1), unless the authorization is terminated  or revoked  sooner.       Influenza A by PCR NEGATIVE NEGATIVE Final   Influenza B by PCR NEGATIVE NEGATIVE Final    Comment: (NOTE) The Xpert Xpress SARS-CoV-2/FLU/RSV plus assay is intended as an aid in the diagnosis of influenza from Nasopharyngeal swab specimens and should not be used as a sole basis for treatment. Nasal washings and aspirates are unacceptable  for Xpert Xpress SARS-CoV-2/FLU/RSV testing.  Fact Sheet for Patients: bloggercourse.com  Fact Sheet for Healthcare Providers: seriousbroker.it  This test is not yet approved or cleared by the [place]  FDA and has been authorized for detection and/or diagnosis of SARS-CoV-2 by FDA under an Emergency Use Authorization (EUA). This EUA will remain in effect (meaning this test can be used) for the duration of the COVID-19 declaration under Section 564(b)(1) of the Act, 21 U.S.C. section 360bbb-3(b)(1), unless the authorization is terminated or revoked.     Resp Syncytial Virus by PCR NEGATIVE NEGATIVE Final    Comment: (NOTE) Fact Sheet for Patients: bloggercourse.com  Fact Sheet for Healthcare Providers: seriousbroker.it  This test is not yet approved or cleared by the [place]  FDA and has been authorized for detection and/or diagnosis of SARS-CoV-2 by FDA under an Emergency Use Authorization (EUA). This EUA will remain in effect (meaning this test can be used) for the duration of the COVID-19 declaration under Section 564(b)(1) of the Act, 21 U.S.C. section 360bbb-3(b)(1), unless the authorization is terminated or revoked.  Performed at [facility], [address], [place], [place] [address]     Labs: CBC: Recent Labs  Lab 11/06/24 1554 11/07/24 0422  WBC 5.5  --   NEUTROABS 4.1  --   HGB 8.5* 7.5*  HCT 28.8* 25.3*  MCV 91.7  --   PLT 279  --    Basic Metabolic Panel: Recent Labs  Lab 11/06/24 1554 11/07/24 0422  NA 140 141  K 5.1 5.3*  CL 95* 98  CO2 25 26  GLUCOSE 77 85  BUN 56* 66*  CREATININE 9.84* 10.70*  CALCIUM  7.4* 7.0*  PHOS  --  9.1*   Liver Function Tests: Recent Labs  Lab 11/06/24 1554 11/07/24 0422  AST 21  --   ALT 8  --   ALKPHOS 372*  --   BILITOT 0.5  --   PROT 7.3  --   ALBUMIN  3.8 3.2*   CBG: No results for  input(s): GLUCAP in the last 168 hours.  Discharge time spent:  35 minutes.  Signed: Eric Nunnery, MD [facility] 11/09/2024

## 2024-11-09 NOTE — Progress Notes (Addendum)
 D/c orders noted. Have contacted [facility] to inform of pt arrival back tomorrow to resume schedule. Will fax d/c summm and last note once available. Will continue to assist as needed.   Butch Otterson Dialysis Navigator [phone]  Addendum 1:25pm D/c summary and last note faxed at this time. No further support needed.

## 2024-11-09 NOTE — Progress Notes (Signed)
° °  Brief Progress Note   _____________________________________________________________________________________________________________  Patient Name: Cathy [name] Patient DOB: 1981-03-17 Date: @TODAY @      Data: Noted during progression rounds Pt. Still in need of desaturation screening.    Action: EPIC secure chat with primary RN.    Response:  Confirmed with primary RN desaturation screening has been completed this morning and should be uploaded in chart shortly. No further intervention needed.  _____________________________________________________________________________________________________________  [facility] RN Expeditor Toia Micale, Corean Lobstein Please contact us  directly via secure chat (search for [facility]) or by calling us  at [phone] [facility]).

## 2024-11-09 NOTE — Progress Notes (Addendum)
SATURATION QUALIFICATIONS: (This note is used to comply with regulatory documentation for home oxygen)  Patient Saturations on Room Air at Rest = 93%  Patient Saturations on Room Air while Ambulating = 86%  Patient Saturations on 2 Liters of oxygen while Ambulating = 93%  Please briefly explain why patient needs home oxygen: 

## 2024-11-09 NOTE — Progress Notes (Addendum)
 Patient ID: Cathy [name], female   DOB: May 21, 1981, 43 y.o.   MRN: [id number] [facility] Progress Note     Assessment/ Plan:   [facility] TTS.  4 hours.  EDW 67 kg.  aVF, 15-gauge.  Flow rates: 400/600.  1K/2.5 calcium .  Heparin : 500 units loading dose, 500 units hourly.  Mircera 120 mcg every 2 weeks (darbo 100mcg given on 12/8), calcitriol  0.25 mcg every treatment   1.  Acute hypoxic respiratory failure: Secondary to volume overload/acute exacerbation of systolic heart failure in the setting of missed hemodialysis. 2. ESRD: Typically on TTS dialysis schedule and underwent hemodialysis emergently Monday + Tues to get back on schedule (1.5L net UF on Tues). Resume outpatient TTS schedule, next HD tomorrow. 3. Anemia: Without overt blood loss, improved hemoglobin and hematocrit overnight and will continue to follow trend to decide on need for PRBC/ESA adjustments. 4. CKD-MBD: Calcium  level at goal with significantly elevated phosphorus level.  She is on calcitriol  for PTH control and began lanthanum  1 g 3 times a day with meals for phosphorus control. Recheck phos levels on Fri. 5. Nutrition: Continue renal diet with sodium and fluid restriction. 6. Hypertension: Blood pressure currently at goal, she does not appear to be significantly hypervolemic on exam and UF goal will be cautious 1.5 L.  Subjective:   Reports that she feels much better after 8 L paracentesis 2 days ago and denies any cramping with dialysis on Tuesday.    Objective:   BP (!) 161/98   Pulse 76   Temp 98.1 F (36.7 C) (Oral)   Resp 18   Ht 5' 6 (1.676 m)   Wt 71.7 kg   LMP 04/07/2020 (Within Months)   SpO2 100%   BMI 25.51 kg/m   Physical Exam: Gen: Appears comfortable resting in bed, on oxygen  via nasal cannula CVS: Pulse regular rhythm, normal rate, S1 and S2 normal Resp: Decreased breath sounds over bases, no distinct rales or rhonchi Abd: Soft, moderately distended, nontender, bowel  sounds normal Ext: Trace edema over ankles, left upper arm AV fistula with intact dressings +thrill  Labs: BMET Recent Labs  Lab 11/06/24 1554 11/07/24 0422  NA 140 141  K 5.1 5.3*  CL 95* 98  CO2 25 26  GLUCOSE 77 85  BUN 56* 66*  CREATININE 9.84* 10.70*  CALCIUM  7.4* 7.0*  PHOS  --  9.1*   CBC Recent Labs  Lab 11/06/24 1554 11/07/24 0422  WBC 5.5  --   NEUTROABS 4.1  --   HGB 8.5* 7.5*  HCT 28.8* 25.3*  MCV 91.7  --   PLT 279  --       Medications:     acidophilus  1 capsule Oral TID   amLODipine   10 mg Oral q AM   aspirin  EC  81 mg Oral Daily   atorvastatin   40 mg Oral Daily   busPIRone   7.5 mg Oral BID   calcitRIOL   0.25 mcg Oral Q M,W,F-HD   carvedilol   12.5 mg Oral BID WC   Chlorhexidine  Gluconate Cloth  6 each Topical Q0600   cloNIDine   0.1 mg Oral BID   darbepoetin (ARANESP ) injection - DIALYSIS  100 mcg Subcutaneous Q Mon-1800   gabapentin   300 mg Oral QHS   heparin   5,000 Units Subcutaneous Q8H   hydrALAZINE   100 mg Oral TID   lanthanum   1,000 mg Oral TID WC   levETIRAcetam   500 mg Oral Daily   And   levETIRAcetam   250 mg Oral Q T,Th,Sa-HD   losartan   100 mg Oral Daily   nicotine   21 mg Transdermal Daily   pantoprazole   40 mg Oral QAC breakfast   sodium chloride  flush  3 mL Intravenous Q12H   sodium chloride  flush  3 mL Intravenous Q12H

## 2024-11-09 NOTE — TOC Transition Note (Signed)
 Transition of Care [facility]) - Discharge Note   Patient Details  Name: Cathy [name] MRN: [id number] Date of Birth: 1981-01-26  Transition of Care [facility]) CM/SW Contact:  Lucie Lunger, LCSWA Phone Number: 11/09/2024, 12:17 PM   Clinical Narrative:    CSW updated that pt qualifies for home O2. MD signed sat qual and placed home DME O2 orders. CSW spoke to Zack with [employer] who confirms they are in network with pts insurance. Referral given, Zack confirms O2 has been delivered to room for pt. TOC signing off.   Final next level of care: Home/Self Care Barriers to Discharge: Barriers Resolved   Patient Goals and CMS Choice Patient states their goals for this hospitalization and ongoing recovery are:: return home CMS Medicare.gov Compare Post Acute Care list provided to:: Patient Choice offered to / list presented to : Patient      Discharge Placement                       Discharge Plan and Services Additional resources added to the After Visit Summary for   In-house Referral: Clinical Social Work Discharge Planning Services: CM Consult            DME Arranged: Oxygen  DME Agency: [employer] Date DME Agency Contacted: 11/09/24   Representative spoke with at DME Agency: Zack            Social Drivers of Health (SDOH) Interventions SDOH Screenings   Food Insecurity: No Food Insecurity (11/06/2024)  Housing: Low Risk  (11/06/2024)  Transportation Needs: No Transportation Needs (11/06/2024)  Utilities: Not At Risk (11/06/2024)  Alcohol Screen: Low Risk  (02/18/2024)  Depression (PHQ2-9): High Risk (09/26/2024)  Financial Resource Strain: Low Risk  (02/18/2024)  Physical Activity: Inactive (02/18/2024)  Social Connections: Unknown (11/06/2024)  Stress: Stress Concern Present (02/18/2024)  Tobacco Use: High Risk (11/07/2024)     Readmission Risk Interventions    11/07/2024    9:28 AM 09/01/2024   10:46 AM 09/01/2024   10:14 AM  Readmission Risk Prevention Plan   Transportation Screening Complete Complete Complete  Medication Review Oceanographer) Complete Complete Complete  HRI or Home Care Consult Complete Complete Complete  HRI or Home Care Consult Pt Refusal Comments  Home alone with children - Mother -n -law will come in to help patient out   SW Recovery Care/Counseling Consult Complete Complete   Palliative Care Screening Not Applicable Not Applicable   Skilled Nursing Facility Not Applicable Not Applicable

## 2024-11-09 NOTE — Progress Notes (Signed)
 Mobility Specialist Progress Note:    11/09/24 0850  Mobility  Activity Ambulated with assistance  Level of Assistance Contact guard assist, steadying assist  Assistive Device Other (Comment) (Uses railings)  Distance Ambulated (ft) 80 ft  Range of Motion/Exercises Active;All extremities  Activity Response Tolerated well  Mobility Referral Yes  Mobility visit 1 Mobility  Mobility Specialist Start Time (ACUTE ONLY) 0850  Mobility Specialist Stop Time (ACUTE ONLY) 0910  Mobility Specialist Time Calculation (min) (ACUTE ONLY) 20 min   Pt received in bed, agreeable to mobility. Required CGA to stand and ambulate with no AD and uses railings for support when needed. Tolerated well, SpO2 93% on RA at rest. SpO2 86% on RA during ambulation, SpO2 86% on 1L during ambulation. Required 2L to bring O2 up to 93% during ambulation. Left sitting EOB, alarm on. All needs met.  Kaulin Chaves Mobility Specialist Please contact via Special Educational Needs Teacher or  Rehab office at [phone]

## 2024-11-10 ENCOUNTER — Telehealth: Payer: Self-pay

## 2024-11-10 LAB — HEPATITIS B SURFACE ANTIBODY, QUANTITATIVE

## 2024-11-10 NOTE — Transitions of Care (Post Inpatient/ED Visit) (Signed)
° °  11/10/2024  Name: Cathy [name] MRN: [id number] DOB: 07/24/1981  Today's TOC FU Call Status: Today's TOC FU Call Status:: Unsuccessful Call (1st Attempt) Unsuccessful Call (1st Attempt) Date: 11/10/24  Attempted to reach the patient regarding the most recent Inpatient/ED visit. RN CM unable to leave a message as designated contact number has message that voice mail box is full. Has HD T/TH/S schedule.   Follow Up Plan: Additional outreach attempts will be made to reach the patient to complete the Transitions of Care (Post Inpatient/ED visit) call.    Bing Edison MSN, RN RN Case Sales [employer]  [facility] Office Hours M-F [phone] Direct Dial : [phone] Main Phone [phone]  Fax: [phone] [email or web address]

## 2024-11-11 ENCOUNTER — Telehealth: Payer: Self-pay

## 2024-11-11 NOTE — Transitions of Care (Post Inpatient/ED Visit) (Signed)
° °  11/11/2024  Name: Cathy [name] MRN: [id number] DOB: 27-Nov-1981  Today's TOC FU Call Status: Today's TOC FU Call Status:: Unsuccessful Call (2nd Attempt) Unsuccessful Call (2nd Attempt) Date: 11/11/24  Attempted to reach the patient regarding the most recent Inpatient/ED visit.  Follow Up Plan: Additional outreach attempts will be made to reach the patient to complete the Transitions of Care (Post Inpatient/ED visit) call.    Bing Edison MSN, RN RN [employer]  [facility] Office Hours M-F [phone] Direct Dial : [phone] Main Phone [phone]  Fax: [phone] [email or web address]

## 2024-11-15 ENCOUNTER — Telehealth: Payer: Self-pay | Admitting: *Deleted

## 2024-11-15 NOTE — Transitions of Care (Post Inpatient/ED Visit) (Signed)
° °  11/15/2024  Name: Cathy [name] MRN: [id number] DOB: 09-13-1981  Today's TOC FU Call Status: Today's TOC FU Call Status:: Unsuccessful Call (3rd Attempt) Unsuccessful Call (3rd Attempt) Date: 11/15/24  Attempted to reach the patient regarding the most recent Inpatient/ED visit. Patient unavailable per person answering phone. RN left message with person answering for Patient to call back.   Follow Up Plan: No further outreach attempts will be made at this time. We have been unable to contact the patient.  Cathlean Headland BSN RN [facility] Care Management Coordinator [email or web address] Direct Dial : [phone]  Fax: [phone] Website: [email or web address]

## 2024-11-30 ENCOUNTER — Other Ambulatory Visit (HOSPITAL_COMMUNITY): Payer: Self-pay

## 2024-12-01 DEATH — deceased
